# Patient Record
Sex: Female | Born: 1960 | Race: Black or African American | Hispanic: No | Marital: Single | Smoking: Former smoker
Health system: Southern US, Community
[De-identification: ages and names within clinical notes are randomized; demographics above are authoritative.]

## PROBLEM LIST (undated history)

## (undated) DIAGNOSIS — H469 Unspecified optic neuritis: Secondary | ICD-10-CM

## (undated) DIAGNOSIS — K439 Ventral hernia without obstruction or gangrene: Secondary | ICD-10-CM

## (undated) DIAGNOSIS — I442 Atrioventricular block, complete: Secondary | ICD-10-CM

## (undated) DIAGNOSIS — Z9289 Personal history of other medical treatment: Secondary | ICD-10-CM

## (undated) DIAGNOSIS — I340 Nonrheumatic mitral (valve) insufficiency: Secondary | ICD-10-CM

## (undated) DIAGNOSIS — I639 Cerebral infarction, unspecified: Secondary | ICD-10-CM

## (undated) DIAGNOSIS — I10 Essential (primary) hypertension: Secondary | ICD-10-CM

## (undated) DIAGNOSIS — E46 Unspecified protein-calorie malnutrition: Secondary | ICD-10-CM

## (undated) DIAGNOSIS — I5032 Chronic diastolic (congestive) heart failure: Secondary | ICD-10-CM

## (undated) DIAGNOSIS — R000 Tachycardia, unspecified: Secondary | ICD-10-CM

## (undated) DIAGNOSIS — H269 Unspecified cataract: Secondary | ICD-10-CM

## (undated) DIAGNOSIS — E538 Deficiency of other specified B group vitamins: Secondary | ICD-10-CM

## (undated) DIAGNOSIS — K219 Gastro-esophageal reflux disease without esophagitis: Secondary | ICD-10-CM

## (undated) DIAGNOSIS — I6529 Occlusion and stenosis of unspecified carotid artery: Secondary | ICD-10-CM

## (undated) DIAGNOSIS — I071 Rheumatic tricuspid insufficiency: Secondary | ICD-10-CM

## (undated) DIAGNOSIS — D649 Anemia, unspecified: Secondary | ICD-10-CM

## (undated) DIAGNOSIS — I052 Rheumatic mitral stenosis with insufficiency: Secondary | ICD-10-CM

## (undated) DIAGNOSIS — I5031 Acute diastolic (congestive) heart failure: Secondary | ICD-10-CM

## (undated) DIAGNOSIS — H40009 Preglaucoma, unspecified, unspecified eye: Secondary | ICD-10-CM

## (undated) DIAGNOSIS — F1020 Alcohol dependence, uncomplicated: Secondary | ICD-10-CM

## (undated) DIAGNOSIS — I471 Supraventricular tachycardia: Secondary | ICD-10-CM

## (undated) DIAGNOSIS — R945 Abnormal results of liver function studies: Secondary | ICD-10-CM

## (undated) DIAGNOSIS — G459 Transient cerebral ischemic attack, unspecified: Secondary | ICD-10-CM

## (undated) DIAGNOSIS — J45909 Unspecified asthma, uncomplicated: Secondary | ICD-10-CM

## (undated) DIAGNOSIS — D539 Nutritional anemia, unspecified: Secondary | ICD-10-CM

## (undated) DIAGNOSIS — R011 Cardiac murmur, unspecified: Secondary | ICD-10-CM

## (undated) DIAGNOSIS — Z9889 Other specified postprocedural states: Secondary | ICD-10-CM

## (undated) DIAGNOSIS — Z954 Presence of other heart-valve replacement: Secondary | ICD-10-CM

## (undated) HISTORY — DX: Rheumatic mitral stenosis with insufficiency: I05.2

## (undated) HISTORY — DX: Personal history of other medical treatment: Z92.89

## (undated) HISTORY — DX: Occlusion and stenosis of unspecified carotid artery: I65.29

## (undated) HISTORY — DX: Alcohol dependence, uncomplicated: F10.20

## (undated) HISTORY — DX: Ventral hernia without obstruction or gangrene: K43.9

## (undated) HISTORY — DX: Acute diastolic (congestive) heart failure: I50.31

## (undated) HISTORY — DX: Chronic diastolic (congestive) heart failure: I50.32

## (undated) HISTORY — DX: Abnormal results of liver function studies: R94.5

## (undated) HISTORY — DX: Preglaucoma, unspecified, unspecified eye: H40.009

## (undated) HISTORY — DX: Unspecified protein-calorie malnutrition: E46

## (undated) HISTORY — DX: Unspecified cataract: H26.9

## (undated) HISTORY — DX: Unspecified optic neuritis: H46.9

## (undated) HISTORY — DX: Rheumatic tricuspid insufficiency: I07.1

## (undated) HISTORY — DX: Deficiency of other specified B group vitamins: E53.8

## (undated) HISTORY — PX: CARDIAC CATHETERIZATION: SHX172

## (undated) HISTORY — PX: CARDIAC VALVE REPLACEMENT: SHX585

## (undated) HISTORY — DX: Supraventricular tachycardia: I47.1

## (undated) HISTORY — PX: APPENDECTOMY: SHX54

## (undated) HISTORY — DX: Nonrheumatic mitral (valve) insufficiency: I34.0

## (undated) NOTE — *Deleted (*Deleted)
Neurology Consultation  CC: Asked to see patient by [facility] attending for drooling and weakness.   History is obtained from: patient and chart  HPI: Mary [name] is a 67 y.o. 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 She missed her last Warfarin check leading to an INR over 10 on admission. She presented to the ED with c/o numbness to her extremities which has moved proximally to her trunk and face. She was found to have AKI which has been corrected with IV hydration. Patient reports an about 6 mos hx of neuropathy which started just in her feet. Her PCP placed her on Neurontin 300mg  po qhs. About a month later, she returned to her PCP because the 300mg  dose was not helping anymore. Then, she was placed on 100mg  po tid with continued dose of 300mg  at bedtime. About 2 weeks ago, she began to notice more pain in her legs and hands and raised the dose of Neurontin to 300mg  po qid on her own. She did not report this to her PCP. Over the past week, she states her numbness has now traveled proximately to her face. Started at chin, then lips, then nose, then entire head. She also states her gait became wobbly and her vision was blurred. She fell at home without noted injury. She did hit her head. She normally walks with a cane and rides in an electric wheelchair when out in stores.  She states her eating has been a lot less because of the pain and numbness in her mouth. She reports grinding of her teeth at night. She states she has bitten both sides of her tongue trying to eat. She denies any recent dental work. She admits to eating mostly meat and few vegetables. She states she has always been a small portion eater. Pain seems to be the main reason for the decreased intake.  When asked about ETOH consumption, she admits to 3 glasses of wine a day or at least every other day. When prompted, she states that maybe she has  been drinking more at home since she lost her job. However, she states she has not had any ETOH in 2 weeks.  She admits to blurry vision at a distance, like watching TV. She also states over the past week, she has had to hold her phone very close to her face to see.  She denies hx of muscle diagnoses or autoimmune problems. Denies thyroid issues. She does not take any vitamins at home except Vit D and Folate. Her sister has sarcoidosis.    ROS: A complete ROS was performed and is negative except as noted in the HPI.   Past Medical History:  Diagnosis Date  . Acute diastolic heart failure (HCC) 03/15/2013  . Anemia   . B12 deficiency   . Carotid stenosis    Carotid US 2/17: bilateral ICA 1-39% >> FU prn  . Childhood asthma   . Complete heart block (HCC)   . Epigastric hernia 200's  . GERD (gastroesophageal reflux disease)   . Heart murmur   . History of blood transfusion    "14 w/1st pregnancy; 2 w/last C-section" (03/15/2013)  . Hx of echocardiogram    a. Echo 4/16:  Mild focal basal septal hypertrophy, EF 60-65%, no RWMA, Mechanical MVR ok with mild central regurgitation and no perivalvular leak, small mobile density attached to valvular ring (1.5x1 cm) - post surgical changes  vs SBE, mild LAE;   b. Echo 2/17:  Mild post wall LVH, EF 55-60%, no RWMA, mechanical MVR ok (mean 5 mmHg), normal RVSF  . Hypertension    no pcp   will go to [facility]  . Macrocytic anemia   . 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   33mm Sorin Carbomedics mechanical prosthesis via right mini thoracotomy approach  . 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 Severe mitral regurgitation 04/22/2013  . Sinus tachycardia   . SVT (supraventricular tachycardia) (HCC)  03/16/2013  . TIA (transient ischemic attack)   . Tricuspid regurgitation      Family History  Problem Relation Age of Onset  . Stroke Father   . Cancer Father   . Sarcoidosis Sister   . Heart attack Neg Hx   . Colon cancer Neg Hx   . Stomach cancer Neg Hx   . Pancreatic cancer Neg Hx   . Esophageal cancer Neg Hx     Social History:  reports that she quit smoking about 7 years ago. Her smoking use included cigarettes. She has a 18.00 pack-year smoking history. She has never used smokeless tobacco. She reports current alcohol use. She reports that she does not use drugs.   Prior to Admission medications   Medication Sig Start Date End Date Taking? Authorizing Provider  acetaminophen (TYLENOL) 500 MG tablet Take 1,000 mg by mouth every 6 (six) hours as needed for headache.   Yes [provider]  aspirin EC 81 MG tablet Take 1 tablet (81 mg total) by mouth daily. 08/26/13  Yes Laurey Morale, MD  folic acid (FOLVITE) 1 MG tablet Take 1 mg by mouth daily.   Yes [provider]  gabapentin (NEURONTIN) 100 MG capsule Take 1 capsule (100 mg total) by mouth 2 (two) times daily. And continue 300 mg at bedtime 06/26/20  Yes Fulp, Cammie, MD  gabapentin (NEURONTIN) 300 MG capsule Take 1 capsule (300 mg total) by mouth at bedtime. As needed for nerve pain Patient taking differently: Take 300 mg by mouth in the morning, at noon, and at bedtime.  06/05/20  Yes Fulp, Cammie, MD  Hyprom-Naphaz-Polysorb-Zn Sulf (CLEAR EYES COMPLETE OP) Place 2 drops into both eyes daily as needed (red eyes).   Yes [provider]  pantoprazole (PROTONIX) 40 MG tablet Take 1 tablet (40 mg total) by mouth daily. 05/05/20 08/14/21 Yes [place], Alvira Philips, DO  Vitamin D, Ergocalciferol, (DRISDOL) 1.25 MG (50000 UNIT) CAPS capsule Take 1 capsule (50,000 Units total) by mouth every 7 (seven) days. Patient taking differently: Take 50,000 Units by mouth every Tuesday.  06/07/20  Yes Claiborne Rigg, NP   warfarin (COUMADIN) 5 MG tablet Take 1 - 1/2 tablet daily or as directed by Coumadin clinic. Patient taking differently: Take 2.5-5 mg by mouth See admin instructions. Take 1 - 1/2 tablet daily or as directed by Coumadin clinic.Wed and Sun 2.5 mg, 5 mg all other days 07/28/20  Yes Marinus Maw, MD  furosemide (LASIX) 40 MG tablet Take 1 tablet (40 mg total) by mouth daily. Patient not taking: Reported on 08/18/2020 05/06/20   Jake Bathe, MD  potassium chloride SA (KLOR-CON) 20 MEQ tablet Take 1 tablet (20 mEq total) by mouth daily. Patient not taking: Reported on 08/18/2020 05/06/20  Jake Bathe, MD     Exam: Current vital signs: BP (!) 112/96 (BP Location: Right Arm)   Pulse 100   Temp 97.8 F (36.6 C) (Oral)   Resp 17   Ht 5\' 2"  (1.575 m)   LMP 03/22/2013   SpO2 100%   BMI 20.49 kg/m    Physical Exam  Constitutional: Appears well-developed and well-nourished.  Psych: Affect appropriate to situation Eyes: No scleral injection HENT: No OP obstrucion Head: Normocephalic.  Cardiovascular: Normal rate and regular rhythm.  Respiratory: Effort normal, non-labored breathing GI: Soft.  No distension. There is no tenderness.  Skin: WDI  Neuro: Mental Status: Patient is awake, alert, oriented to person, place, month, year, and situation Patient is able to give a clear and coherent history. No signs of aphasia or neglect Cranial Nerves: II: Visual Fields are decreased. Pupils are equal, round, and reactive to light.  III,IV, VI: EOMI without ptosis or diplopia.  V: Facial sensation is symmetric to stroking, but less on left to temperature.  VII: Facial movement is symmetric.  VIII: hearing is intact to voice. Weber/Rinne normal.  X: Uvula elevates symmetrically XI: Shoulder shrug is symmetric. XII: tongue is midline without atrophy or fasciculations.  Motor: Bulk is normal. Tone is difficult to assess to LEs due to pain. She is able to lift her left leg slightly off bed  and hold it for about 4 seconds. She is only able to life her right leg about 2 inches off bed and can not hold it. She is able to kick out her right leg when knee is held.  Sensory: sharp and dull is intact but less on the left face, arm and leg. Generally, decreased overall to feet, but still right over left.  Deep Tendon Reflexes: 2+ and symmetric in the biceps and radial. 1+ patellar and ankles.  Plantars: Toes are downgoing bilaterally.  Cerebellar: Unable to perform.  Gait: Deferred.    I have reviewed labs in epic and the pertinent results are:  Lab Results  Component Value Date/Time   CHOL 189 05/31/2018 11:08 AM    Results for orders placed or performed during the hospital encounter of 08/17/20 (from the past 48 hour(s))  CBC with Differential     Status: Abnormal   Collection Time: 08/17/20  4:05 PM  Result Value Ref Range   WBC 5.4 4.0 - 10.5 K/uL   RBC 2.13 (L) 3.87 - 5.11 MIL/uL   Hemoglobin 9.4 (L) 12.0 - 15.0 g/dL   HCT 40.9 (L) 36 - 46 %   MCV 126.8 (H) 80.0 - 100.0 fL   MCH 44.1 (H) 26.0 - 34.0 pg   MCHC 34.8 30.0 - 36.0 g/dL   RDW 81.1 (H) 91.4 - 78.2 %   Platelets 368 150 - 400 K/uL   nRBC 0.4 (H) 0.0 - 0.2 %   Neutrophils Relative % 69 %   Neutro Abs 3.7 1.7 - 7.7 K/uL   Lymphocytes Relative 25 %   Lymphs Abs 1.3 0.7 - 4.0 K/uL   Monocytes Relative 5 %   Monocytes Absolute 0.3 0.1 - 1.0 K/uL   Eosinophils Relative 1 %   Eosinophils Absolute 0.1 0.0 - 0.5 K/uL   Basophils Relative 0 %   Basophils Absolute 0.0 0.0 - 0.1 K/uL   Immature Granulocytes 0 %   Abs Immature Granulocytes 0.02 0.00 - 0.07 K/uL    Comment: Performed at [facility], [address], [place], [place] [address]  Comprehensive  metabolic panel     Status: Abnormal   Collection Time: 08/17/20  4:05 PM  Result Value Ref Range   Sodium 134 (L) 135 - 145 mmol/L   Potassium 5.0 3.5 - 5.1 mmol/L    Comment: HEMOLYSIS AT THIS LEVEL MAY AFFECT RESULT   Chloride 104 98 - 111 mmol/L    CO2 16 (L) 22 - 32 mmol/L   Glucose, Bld 89 70 - 99 mg/dL    Comment: Glucose reference range applies only to samples taken after fasting for at least 8 hours.   BUN 16 6 - 20 mg/dL   Creatinine, Ser 1.61 (H) 0.44 - 1.00 mg/dL   Calcium 8.7 (L) 8.9 - 10.3 mg/dL   Total Protein 5.8 (L) 6.5 - 8.1 g/dL   Albumin 3.0 (L) 3.5 - 5.0 g/dL   AST 92 (H) 15 - 41 U/L   ALT 47 (H) 0 - 44 U/L   Alkaline Phosphatase 114 38 - 126 U/L   Total Bilirubin 1.3 (H) 0.3 - 1.2 mg/dL   GFR, Estimated 45 (L) >60 mL/min    Comment: (NOTE) Calculated using the CKD-EPI Creatinine Equation (2021)    Anion gap 14 5 - 15    Comment: Performed at [facility], [address] [place], [place] [address]  I-Stat arterial blood gas, ED     Status: Abnormal   Collection Time: 08/18/20 12:04 AM  Result Value Ref Range   pH, Arterial 7.489 (H) 7.35 - 7.45   pCO2 arterial 22.4 (L) 32 - 48 mmHg   pO2, Arterial 106 83 - 108 mmHg   Bicarbonate 17.1 (L) 20.0 - 28.0 mmol/L   TCO2 18 (L) 22 - 32 mmol/L   O2 Saturation 99.0 %   Acid-base deficit 5.0 (H) 0.0 - 2.0 mmol/L   Sodium 135 135 - 145 mmol/L   Potassium 3.9 3.5 - 5.1 mmol/L   Calcium, Ion 1.18 1.15 - 1.40 mmol/L   HCT 27.0 (L) 36 - 46 %   Hemoglobin 9.2 (L) 12.0 - 15.0 g/dL   Patient temperature 54.0 F    Collection site Radial    Drawn by RT    Sample type ARTERIAL   Protime-INR     Status: Abnormal   Collection Time: 08/18/20  1:31 AM  Result Value Ref Range   Prothrombin Time >90.0 (H) 11.4 - 15.2 seconds   INR >10.0 (HH) 0.8 - 1.2    Comment: REPEATED TO VERIFY CRITICAL RESULT CALLED TO, READ BACK BY AND VERIFIED WITH: Y. PATTERSON,RN [id number] 08/18/2020 T. TYSOR (NOTE) INR goal varies based on device and disease states. Performed at [facility], [address] [place], [place] [address]   Ammonia     Status: Abnormal   Collection Time: 08/18/20  1:31 AM  Result Value Ref Range   Ammonia 49 (H) 9 - 35 umol/L    Comment: Performed at  [facility], [address] [place], [place] [address]  Basic metabolic panel     Status: Abnormal   Collection Time: 08/18/20  8:10 AM  Result Value Ref Range   Sodium 137 135 - 145 mmol/L   Potassium 3.5 3.5 - 5.1 mmol/L   Chloride 105 98 - 111 mmol/L   CO2 21 (L) 22 - 32 mmol/L   Glucose, Bld 93 70 - 99 mg/dL    Comment: Glucose reference range applies only to samples taken after fasting for at least 8 hours.   BUN 13  6 - 20 mg/dL   Creatinine, Ser 2.72 (H) 0.44 - 1.00 mg/dL   Calcium 8.6 (L) 8.9 - 10.3 mg/dL   GFR, Estimated 52 (L) >60 mL/min    Comment: (NOTE) Calculated using the CKD-EPI Creatinine Equation (2021)    Anion gap 11 5 - 15    Comment: Performed at [facility], [address], [place], [place] [address]  Respiratory Panel by RT PCR (Flu A&B, Covid) - Nasopharyngeal Swab     Status: None   Collection Time: 08/18/20  9:52 AM   Specimen: Nasopharyngeal Swab; Nasopharyngeal(NP) swabs in vial transport medium  Result Value Ref Range   SARS Coronavirus 2 by RT PCR NEGATIVE NEGATIVE    Comment: (NOTE) SARS-CoV-2 target nucleic acids are NOT DETECTED.  The SARS-CoV-2 RNA is generally detectable in upper respiratoy specimens during the acute phase of infection. The lowest concentration of SARS-CoV-2 viral copies this assay can detect is 131 copies/mL. A negative result does not preclude SARS-Cov-2 infection and should not be used as the sole basis for treatment or other patient management decisions. A negative result may occur with  improper specimen collection/handling, submission of specimen other than nasopharyngeal swab, presence of viral mutation(s) within the areas targeted by this assay, and inadequate number of viral copies (<131 copies/mL). A negative result must be combined with clinical observations, patient history, and epidemiological information. The expected result is Negative.  Fact Sheet for Patients:   https://www.moore.com/  Fact Sheet for Healthcare Providers:  https://www.young.biz/  This test is no t yet approved or cleared by the [place] FDA and  has been authorized for detection and/or diagnosis of SARS-CoV-2 by FDA under an Emergency Use Authorization (EUA). This EUA will remain  in effect (meaning this test can be used) for the duration of the COVID-19 declaration under Section 564(b)(1) of the Act, 21 U.S.C. section 360bbb-3(b)(1), unless the authorization is terminated or revoked sooner.     Influenza A by PCR NEGATIVE NEGATIVE   Influenza B by PCR NEGATIVE NEGATIVE    Comment: (NOTE) The Xpert Xpress SARS-CoV-2/FLU/RSV assay is intended as an aid in  the diagnosis of influenza from Nasopharyngeal swab specimens and  should not be used as a sole basis for treatment. Nasal washings and  aspirates are unacceptable for Xpert Xpress SARS-CoV-2/FLU/RSV  testing.  Fact Sheet for Patients: https://www.moore.com/  Fact Sheet for Healthcare Providers: https://www.young.biz/  This test is not yet approved or cleared by the [place] FDA and  has been authorized for detection and/or diagnosis of SARS-CoV-2 by  FDA under an Emergency Use Authorization (EUA). This EUA will remain  in effect (meaning this test can be used) for the duration of the  Covid-19 declaration under Section 564(b)(1) of the Act, 21  U.S.C. section 360bbb-3(b)(1), unless the authorization is  terminated or revoked. Performed at [facility], [address], [place], [place] [address]     MR BRAIN WO CONTRAST  Result Date: 08/18/2020 CLINICAL DATA:  Dizziness EXAM: MRI HEAD WITHOUT CONTRAST TECHNIQUE: Multiplanar, multiecho pulse sequences of the brain and surrounding structures were obtained without intravenous contrast. COMPARISON:  None. FINDINGS: Brain: No acute infarct, acute hemorrhage or extra-axial  collection. Normal white matter signal. Normal volume of CSF spaces. Fewer than 5 scattered microhemorrhages in a nonspecific pattern. Normal midline structures. Vascular: Major flow voids are preserved. Skull and upper cervical spine: Normal calvarium and skull base. Visualized upper cervical spine and soft tissues are normal. Sinuses/Orbits:No paranasal sinus fluid  levels or advanced mucosal thickening. No mastoid or middle ear effusion. Normal orbits. IMPRESSION: 1. No acute intracranial abnormality. 2. Fewer than 5 scattered microhemorrhages in a nonspecific pattern. Electronically Signed   By: Deatra Robinson M.D.   On: 08/18/2020 01:22     I have reviewed the images obtained  Primary Diagnosis:  1. Extremity weakness with motor and sensory deficits.  2. ETOH use 3. Poor nutrition (lack of vegetables).   Impression: 30 yo female who presents with drooling, weakness, numbness to the ED. On hx, discovered her appetite is poor and she lacks good nutrition. There is also an ETOH component which may play a role in her weakness and numbness.   Recommendations: 1. Check various Vit levels. 2. Check heavy metal screen.  3. Check ETOH level.  4. Auto immune workup.   Jimmye Norman, NP-pt seen independently and also by Dr. Kirtland Bouchard. Further recommendation per him.     Electronically signed

---

## 1997-09-26 HISTORY — PX: TUBAL LIGATION: SHX77

## 1997-11-11 ENCOUNTER — Ambulatory Visit (HOSPITAL_COMMUNITY): Admission: RE | Admit: 1997-11-11 | Discharge: 1997-11-11 | Payer: Self-pay | Admitting: Obstetrics

## 1997-12-01 ENCOUNTER — Ambulatory Visit (HOSPITAL_COMMUNITY): Admission: RE | Admit: 1997-12-01 | Discharge: 1997-12-01 | Payer: Self-pay | Admitting: Obstetrics

## 1998-01-14 ENCOUNTER — Ambulatory Visit (HOSPITAL_COMMUNITY): Admission: RE | Admit: 1998-01-14 | Discharge: 1998-01-14 | Payer: Self-pay | Admitting: Obstetrics

## 1998-02-23 ENCOUNTER — Ambulatory Visit (HOSPITAL_COMMUNITY): Admission: RE | Admit: 1998-02-23 | Discharge: 1998-02-23 | Payer: Self-pay | Admitting: Obstetrics

## 1998-02-25 ENCOUNTER — Other Ambulatory Visit: Admission: RE | Admit: 1998-02-25 | Discharge: 1998-02-25 | Payer: Self-pay | Admitting: Obstetrics

## 1998-03-09 ENCOUNTER — Ambulatory Visit (HOSPITAL_COMMUNITY): Admission: RE | Admit: 1998-03-09 | Discharge: 1998-03-09 | Payer: Self-pay | Admitting: Obstetrics

## 1998-03-19 ENCOUNTER — Inpatient Hospital Stay (HOSPITAL_COMMUNITY): Admission: AD | Admit: 1998-03-19 | Discharge: 1998-03-22 | Payer: Self-pay | Admitting: Obstetrics

## 1998-05-21 ENCOUNTER — Inpatient Hospital Stay (HOSPITAL_COMMUNITY): Admission: EM | Admit: 1998-05-21 | Discharge: 1998-05-24 | Payer: Self-pay

## 2000-08-05 ENCOUNTER — Emergency Department (HOSPITAL_COMMUNITY): Admission: EM | Admit: 2000-08-05 | Discharge: 2000-08-05 | Payer: Self-pay | Admitting: *Deleted

## 2000-08-05 ENCOUNTER — Encounter: Payer: Self-pay | Admitting: *Deleted

## 2001-01-24 ENCOUNTER — Other Ambulatory Visit: Admission: RE | Admit: 2001-01-24 | Discharge: 2001-01-24 | Payer: Self-pay | Admitting: Obstetrics

## 2001-02-07 ENCOUNTER — Encounter: Payer: Self-pay | Admitting: Obstetrics

## 2001-02-07 ENCOUNTER — Ambulatory Visit (HOSPITAL_COMMUNITY): Admission: RE | Admit: 2001-02-07 | Discharge: 2001-02-07 | Payer: Self-pay | Admitting: Obstetrics

## 2001-09-26 HISTORY — PX: LAPAROSCOPIC CHOLECYSTECTOMY: SUR755

## 2002-04-10 ENCOUNTER — Encounter: Payer: Self-pay | Admitting: Emergency Medicine

## 2002-04-10 ENCOUNTER — Emergency Department (HOSPITAL_COMMUNITY): Admission: EM | Admit: 2002-04-10 | Discharge: 2002-04-10 | Payer: Self-pay | Admitting: Emergency Medicine

## 2002-04-25 ENCOUNTER — Encounter: Payer: Self-pay | Admitting: Surgery

## 2002-04-25 ENCOUNTER — Encounter (INDEPENDENT_AMBULATORY_CARE_PROVIDER_SITE_OTHER): Payer: Self-pay | Admitting: Specialist

## 2002-04-25 ENCOUNTER — Observation Stay (HOSPITAL_COMMUNITY): Admission: RE | Admit: 2002-04-25 | Discharge: 2002-04-26 | Payer: Self-pay | Admitting: Surgery

## 2002-12-12 ENCOUNTER — Emergency Department (HOSPITAL_COMMUNITY): Admission: EM | Admit: 2002-12-12 | Discharge: 2002-12-12 | Payer: Self-pay | Admitting: Emergency Medicine

## 2004-07-27 ENCOUNTER — Ambulatory Visit: Payer: Self-pay | Admitting: Sports Medicine

## 2004-07-27 ENCOUNTER — Encounter (INDEPENDENT_AMBULATORY_CARE_PROVIDER_SITE_OTHER): Payer: Self-pay | Admitting: *Deleted

## 2004-07-27 ENCOUNTER — Other Ambulatory Visit: Admission: RE | Admit: 2004-07-27 | Discharge: 2004-07-27 | Payer: Self-pay | Admitting: Family Medicine

## 2004-08-24 ENCOUNTER — Ambulatory Visit (HOSPITAL_COMMUNITY): Admission: RE | Admit: 2004-08-24 | Discharge: 2004-08-24 | Payer: Self-pay

## 2005-07-04 ENCOUNTER — Emergency Department (HOSPITAL_COMMUNITY): Admission: EM | Admit: 2005-07-04 | Discharge: 2005-07-04 | Payer: Self-pay | Admitting: Emergency Medicine

## 2005-07-13 ENCOUNTER — Ambulatory Visit: Payer: Self-pay | Admitting: Family Medicine

## 2005-07-28 ENCOUNTER — Ambulatory Visit: Payer: Self-pay | Admitting: Family Medicine

## 2005-07-28 ENCOUNTER — Inpatient Hospital Stay (HOSPITAL_COMMUNITY): Admission: EM | Admit: 2005-07-28 | Discharge: 2005-07-29 | Payer: Self-pay | Admitting: Emergency Medicine

## 2006-09-23 ENCOUNTER — Emergency Department (HOSPITAL_COMMUNITY): Admission: EM | Admit: 2006-09-23 | Discharge: 2006-09-23 | Payer: Self-pay | Admitting: Emergency Medicine

## 2006-11-24 ENCOUNTER — Encounter (INDEPENDENT_AMBULATORY_CARE_PROVIDER_SITE_OTHER): Payer: Self-pay | Admitting: *Deleted

## 2008-06-30 ENCOUNTER — Emergency Department (HOSPITAL_COMMUNITY): Admission: EM | Admit: 2008-06-30 | Discharge: 2008-06-30 | Payer: Self-pay | Admitting: Emergency Medicine

## 2009-09-15 ENCOUNTER — Emergency Department (HOSPITAL_COMMUNITY): Admission: EM | Admit: 2009-09-15 | Discharge: 2009-09-15 | Payer: Self-pay | Admitting: Emergency Medicine

## 2010-08-23 ENCOUNTER — Emergency Department (HOSPITAL_COMMUNITY)
Admission: EM | Admit: 2010-08-23 | Discharge: 2010-08-24 | Payer: Self-pay | Source: Home / Self Care | Admitting: Emergency Medicine

## 2010-11-12 ENCOUNTER — Encounter: Payer: Self-pay | Admitting: Family Medicine

## 2010-12-08 LAB — URINALYSIS, ROUTINE W REFLEX MICROSCOPIC
Nitrite: NEGATIVE
Specific Gravity, Urine: 1.022 (ref 1.005–1.030)
Urobilinogen, UA: 0.2 mg/dL (ref 0.0–1.0)
pH: 5.5 (ref 5.0–8.0)

## 2010-12-08 LAB — COMPREHENSIVE METABOLIC PANEL
ALT: 17 U/L (ref 0–35)
Alkaline Phosphatase: 90 U/L (ref 39–117)
CO2: 23 mEq/L (ref 19–32)
Calcium: 8.5 mg/dL (ref 8.4–10.5)
Chloride: 110 mEq/L (ref 96–112)
GFR calc non Af Amer: >60 mL/min (ref >60)
Glucose, Bld: 81 mg/dL (ref 70–99)
Potassium: 3.3 mEq/L — ABNORMAL LOW (ref 3.5–5.1)
Sodium: 140 mEq/L (ref 135–145)
Total Bilirubin: 0.8 mg/dL (ref 0.3–1.2)

## 2010-12-08 LAB — URINE MICROSCOPIC-ADD ON

## 2010-12-08 LAB — DIFFERENTIAL
Basophils Relative: 0 % (ref 0–1)
Eosinophils Absolute: 0.1 10*3/uL (ref 0.0–0.7)
Neutrophils Relative %: 67 % (ref 43–77)

## 2010-12-08 LAB — HEMOCCULT GUIAC POC 1CARD (OFFICE): Fecal Occult Bld: POSITIVE

## 2010-12-08 LAB — POCT CARDIAC MARKERS
CKMB, poc: <1 ng/mL — ABNORMAL LOW (ref 1.0–8.0)
Myoglobin, poc: 31.4 ng/mL (ref 12–200)

## 2010-12-08 LAB — CBC
HCT: 40.5 % (ref 36.0–46.0)
Hemoglobin: 13.9 g/dL (ref 12.0–15.0)
MCHC: 34.3 g/dL (ref 30.0–36.0)

## 2010-12-08 LAB — PROTIME-INR: INR: 1.06 (ref 0.00–1.49)

## 2011-02-11 NOTE — H&P (Signed)
NAME:  Mary [name], Mary [name] NO.:  [id number]   MEDICAL RECORD NO.:  [id number]          PATIENT TYPE:  INP   LOCATION:  5714                         FACILITY:  [facility]   PHYSICIAN:  Asencion Partridge, M.D.     DATE OF BIRTH:  17-Jul-1961   DATE OF ADMISSION:  07/28/2005  DATE OF DISCHARGE:                                HISTORY & PHYSICAL   PRIMARY CARE PHYSICIAN:  Dr. Sharin Grave, [facility].   CHIEF COMPLAINT:  Abdominal pain/nausea and vomiting.   HISTORY OF PRESENT ILLNESS:  The patient is a 50 year old African-American  female that was in her usual state of good health until about 3 weeks ago  when she began having non bilious, non bloody emesis that has since  decreased in frequency. She now has nausea even after she has vomited and it  has become an apparently chronic condition. She has had a significant  decrease in tolerance to p.o. solids. She has noted a dry mouth recently and  decreased urine output.  She has not had any sick contacts. She has not had any previous symptoms.  She has noted no other people with similar symptoms. She has also had  frequent loose stools recently. She has not noticed any blood in her stools  and has not noticed any melanotic changes. Of note, the patient states that  her vomitus had a mucus-like film that was slightly yellow tinged and was  similar to the film that was present in her loose stools.   REVIEW OF SYSTEMS:  CONSTITUTIONAL: She has had chills and occasional  dizziness. No fevers. CARDIOVASCULAR:  No chest pains. PULMONARY:  No  shortness of breath. GI: She has had diffuse abdominal pain. GENITOURINARY:  Decrease in the frequency of her urination. She has also noted that her  urine was darker than usual. However she has had no burning with urination.  SKIN: No rash. GYNECOLOGIC: She has had no change in periods and has regular  menses. PSYCHIATRIC: She has had some endorsement of recent  depressive  symptoms. She says, I feel depressed. She has no suicidal, no homicidal  ideations. She has changes in her sleep. She has also had decreased  activity. She normally is very active in helping care for her teenage son  but has been less engaged in her life recently. She has never had any  previous symptoms like this and she endorses no history of manic symptoms.   PAST MEDICAL HISTORY:  1.  Appendectomy.  2.  Cholecystectomy.  3.  Two cesarean sections.  4.  Bilateral tubal ligation.   ALLERGIES:  No known drug allergies.   MEDICATIONS:  Ibuprofen p.r.n.   SOCIAL HISTORY:  She lives with her husband in [place]. She has two sons  and one daughter. She smokes about 1/3 pack a day. She uses marijuana  occasionally, no other drugs. She works as a Counsellor. She uses alcohol  rarely.   FAMILY HISTORY:  Notable for a sister with sarcoidosis. Her mother and  father are alive and healthy.   PHYSICAL EXAMINATION:  VITAL SIGNS:  Temperature 96.7, pulse 72, respiratory  rate 20, blood pressure 102/68, 100% on room air.  GENERAL: She is in no apparent distress.  MENTAL STATUS:  She is alert and oriented.  HEENT:  Normocephalic. Pupils are equal, round and reactive to light. Ears -  tympanic membranes within normal limits bilaterally. Mouth/throat - mucous  membranes are mildly dry, no oropharyngeal erythema.  CHEST EXAM:  Precordium is within normal limits. Lungs are clear to  auscultation bilaterally.  HEART:  Normal S1, S2, she has no murmurs, rubs, or gallops appreciated.  ABDOMEN: Mildly tender to palpation along the left lower quadrant. No  rebound. Positive bowel sounds. Her bellie is soft, it is not consistent  with an acute abdomen.  She has no suprapubic pain that is noted in the area of the bladder.  EXTREMITIES:  She has a healing lesion on the left shin. No edema. Pulses  are 2+ and symmetric bilaterally in the radius and dorsalis pedis  distribution.  GENITOR  ECTAL EXAM:  Deferred.  NEUROLOGIC:  Cerebellar function is within normal limits. Cranial nerves II-  XII grossly intact are intact bilaterally. Strength and deep tendon reflexes  are within normal limits for bilateral upper and lower extremities. Balance  was not tested.  SKIN:  She has no rash.  LYMPH NODES:  She has no lymphadenopathy.  BACK:  No costovertebral angle tenderness.   LABORATORY DATA:  Urine pregnancy test is negative. Urinalysis is notable  for positive nitrites, small leukocyte esterase and small blood, urine was  cloudy with specific gravity of 1.015.  Urine microscopic showed 36 wbc's, 0-2 rbc's, and many bacteria. CBC is  notable for a white count of 6300 and hemoglobin 14.1, sed rate is 7.  Electrolytes within normal limits. Liver function tests within normal  limits. Lipase was 12.   CT of the abdomen obtained in the emergency department shows no acute  issues. There is a prominent ovarian vein which raises the possibility of  pelvic congestion syndrome.  There is also a small epigastric ventral hernia with only fat. She has  bilateral ovarian follicles. She has no free fluid noted in the abdomen. She  does have a right inguinal cystic lesion consistent with a lymphocele versus  a seroma.   ASSESSMENT/PLAN:  The patient is a 50 year old female with the following  problems:  1.  Dehydration likely secondary to problem number two. Will admit for bolus      IV fluids and treat with either Zofran or Phenergan. Her electrolytes      are okay, we will follow these.  2.  Vomiting/nausea/loose stools. There is no clear etiology for this. It is      possible that the findings on CT could be related to this. Her      urinalysis was noted to have the abnormalities above but she is having      no symptoms and this is not consistent with pyelonephritis. Viral      gastroenteritis is also unlikely given problem number 6 below. Would     consider irritable bowel syndrome as  a diagnosis. She has no previous no      predisposing factors for gastroparesis and she has no symptoms of GERD.      Metabolic issues would be in the differential such as celiac disease.      However, at this point dehydration and response to Zofran in the ED, I      think is reasonable to  admit her, rehydrate her, treat symptomatically      and follow her clinically. If her symptoms persist she can be worked up      further for the findings on the CT.  3.  Positive leukocyte esterase and nitrate on urinalysis. I will culture      her urine. She is having no symptoms and I will not treat her with      antibiotics now.  4.  Fluids/electrolytes/nutrition: Start on clear fluids and advance diet as      tolerated. She will be bolused as listed above.  5.  Deep venous thrombosis prophylaxis. Have her out of bed x3 daily.  6.  Possible depression/mood changes. This could be adjustment reaction      versus an actual mood disorder. She has no suicidal or homicidal      ideations. She is stable. I will follow this pending number one.      Dwana Curd Para March, M.D.    ______________________________  Asencion Partridge, M.D.    GSD/MEDQ  D:  07/28/2005  T:  07/29/2005  Job:  [id number]

## 2011-02-11 NOTE — Discharge Summary (Signed)
NAME:  Mary [name], Mary [name]               ACCOUNT NO.:  [id number]   MEDICAL RECORD NO.:  [id number]          PATIENT TYPE:  INP   LOCATION:  5714                         FACILITY:  [facility]   PHYSICIAN:  Rolm Gala, M.D.    DATE OF BIRTH:  1961/02/17   DATE OF ADMISSION:  07/28/2005  DATE OF DISCHARGE:  07/29/2005                                 DISCHARGE SUMMARY   PRIMARY CARE PHYSICIAN:  Dr. Tressia Danas at [facility].   CONSULTING PHYSICIANS:  None.   FINAL DIAGNOSIS:  Viral gastroenteritis.   PRINCIPLE PROCEDURE:  None.   LABORATORY DATA:  UA on November 2 shows specific gravity 1.015, negative  glucose, bilirubin, ketones.  Negative protein.  Positive nitrites.  Small  leukocytes.  Microscopic urine showed many bacteria, 3-6 white blood cells,  a few squamous epithelials.  CBC showed WBC 6.3, H&H 14.1 and 41.8,  platelets 369 with 61% neutrophils.  Lipase on November 2 was 12.  LFTs were  within normal limits.  On November 2, ESR was 7.  Urine culture is pending.  BMET on November 3 showed potassium 3.2.   HOSPITAL COURSE:  This is a 50 year old African-American female who was in  her usual good state of health who began having emesis that started three  weeks ago but that has been decreasing in frequency.  The nausea has now  become an apparently chronic condition.  She decreased her tolerance to p.o.  solids and some liquids.  Recently, she has noticed dry mouth and decreased  urine output.   Problem 1. Dehydration:  Likely secondary to the nausea and vomiting.  We  gave her IV fluid bolus and then maintenance IV fluids.   Problem 2. Nausea and vomiting, loose stools:  We treated her with Zofran  and Phenergan.  There is no clear etiology for this.  A CT was negative for  any findings related to this.  Her urinalysis was noted to have the  abnormalities noted above, but she is having no symptoms and is not  consistent with pyelonephritis.  We will follow up on the  urine culture.  Viral gastroenteritis is most likely.  Would consider irritable bowel  syndrome as an outpatient diagnosis.  She was discharged on Phenergan for  this problem.   Problem 3. Positive leukocyte esterase and nitrite on urinalysis:  Following  up on urine culture, she currently has no symptoms.   DISCHARGE INSTRUCTIONS:  Take medications as previously directed.  Follow up  with primary care doctor next week.   DISCHARGE MEDICATIONS:  Phenergan 12.5 mg 1 q.6h. p.r.n. nausea.      Rolm Gala, M.D.     Bennetta Laos  D:  07/29/2005  T:  07/29/2005  Job:  [id number]   cc:   Sharin Grave, MD  Fax: [phone]

## 2011-02-11 NOTE — Op Note (Signed)
Mary [name], Mary [name]                        ACCOUNT NO.:  [id number]   MEDICAL RECORD NO.:  [id number]                   PATIENT TYPE:  OBV   LOCATION:  0347                                 FACILITY:  [facility]   PHYSICIAN:  Currie Paris, M.D.           DATE OF BIRTH:  July 27, 1961   DATE OF PROCEDURE:  04/25/2002  DATE OF DISCHARGE:  04/26/2002                                 OPERATIVE REPORT   CCS#:  [id number]   PREOPERATIVE DIAGNOSIS:  Chronic calculous cholecystitis.   POSTOPERATIVE DIAGNOSES:  1. Chronic calculous cholecystitis.  2. Epigastric hernia.   PROCEDURE:  Laparoscopic cholecystectomy.   SURGEON:  Dr. Jamey Ripa.   ASSISTANT:  Ma Hillock, RNFA   CLINICAL HISTORY:  This patient is a 50 year old with biliary type symptoms  and gallstones. She was scheduled for laparoscopic cholecystectomy.   DESCRIPTION OF PROCEDURE:  The patient was seen in the holding area and had  no further questions. She was taken to the operating room and after  satisfactory general endotracheal anesthesia had been obtained the abdomen  was prepped and draped. The 0.25% plain Marcaine was used for each incision.  The umbilical incision was made first and the fascia identified and opened  and the peritoneal cavity entered under direct vision. A pursestring was  placed and the Hasson introduced. The abdomen was insufflated to 15.   The patient was placed in reverse Trendelenburg and three additional  cannulas were placed under direct vision. The gallbladder had a few  adhesions on it but easily retracted up over the liver. I did note the  presence of an epigastric hernia since I put the umbilical camera in and  this was actually palpable with the abdomen insufflated but appeared just to  have a little preperitoneal fat in it. I avoided this with the epigastric  trocar.   The peritoneum over the cystic duct area was opened and I made a nice window  here and could see the cystic duct and  cleaned it off so I could see its  junction with the gallbladder and down towards the common duct. I could see  what appeared to be the right hepatic artery branching up parallel to the  cystic duct and then curving back into the liver and giving off the cystic  artery. I divided and cleared off the cystic artery and put one clip on that  and one clip on the cystic duct.   The cystic duct was opened and a Cook catheter used to do cholangiography  which looked normal with good filling of the hepatic ducts, common duct and  duodenum with no evidence of any filling defects and prompt emptying into  the duodenum. The catheter was removed and three clips placed on the stay  side of the cystic duct and it was divided. The cystic artery was cleaned  off a little bit more and two more clips placed and it was divided  leaving  two clips on the stay side. The small posterior branch was clipped and then  the gallbladder removed from below to above with coagulation current of the  cautery. Just prior to disconnecting I made sure everything was dry.   The gallbladder was brought out the umbilical port. The umbilical port was  occluded and the abdomen reinsufflated for final irrigation and checked for  hemostasis and again everything appeared to be dry. The lateral ports were  removed. The umbilical port was closed down with the pursestring and the  abdomen deflated through the epigastric port. The patient tolerated the  procedure well. There were no operative complications. All counts were  correct.                                                 Currie Paris, M.D.    CJS/MEDQ  D:  04/25/2002  T:  05/01/2002  Job:  [id number]   cc:   Kathreen Cosier, M.D.

## 2011-06-28 LAB — DIFFERENTIAL
Basophils Relative: 1
Monocytes Relative: 9
Neutro Abs: 2.5
Neutrophils Relative %: 51

## 2011-06-28 LAB — BASIC METABOLIC PANEL
Calcium: 9.2
Creatinine, Ser: 0.78
GFR calc Af Amer: >60
GFR calc non Af Amer: >60

## 2011-06-28 LAB — URINE MICROSCOPIC-ADD ON

## 2011-06-28 LAB — URINALYSIS, ROUTINE W REFLEX MICROSCOPIC
Glucose, UA: NEGATIVE
Specific Gravity, Urine: 1.026

## 2011-06-28 LAB — CBC
MCHC: 33.7
RBC: 4.11
WBC: 4.8

## 2011-06-28 LAB — POCT PREGNANCY, URINE: Preg Test, Ur: NEGATIVE

## 2011-07-27 ENCOUNTER — Emergency Department (HOSPITAL_COMMUNITY)
Admission: EM | Admit: 2011-07-27 | Discharge: 2011-07-27 | Disposition: A | Discharge status: Home / Self Care | Payer: Self-pay | Attending: Emergency Medicine | Admitting: Emergency Medicine

## 2011-07-27 DIAGNOSIS — R112 Nausea with vomiting, unspecified: Secondary | ICD-10-CM | POA: Insufficient documentation

## 2011-07-27 DIAGNOSIS — R42 Dizziness and giddiness: Secondary | ICD-10-CM | POA: Insufficient documentation

## 2011-07-27 DIAGNOSIS — F1010 Alcohol abuse, uncomplicated: Secondary | ICD-10-CM | POA: Insufficient documentation

## 2011-07-27 DIAGNOSIS — E162 Hypoglycemia, unspecified: Secondary | ICD-10-CM | POA: Insufficient documentation

## 2011-07-27 LAB — COMPREHENSIVE METABOLIC PANEL
ALT: 37 U/L — ABNORMAL HIGH (ref 0–35)
Alkaline Phosphatase: 86 U/L (ref 39–117)
CO2: 15 mEq/L — ABNORMAL LOW (ref 19–32)
Calcium: 9.1 mg/dL (ref 8.4–10.5)
GFR calc Af Amer: >90 mL/min (ref >90)
GFR calc non Af Amer: >90 mL/min (ref >90)
Glucose, Bld: 47 mg/dL — ABNORMAL LOW (ref 70–99)
Potassium: 2.8 mEq/L — ABNORMAL LOW (ref 3.5–5.1)
Sodium: 141 mEq/L (ref 135–145)
Total Bilirubin: 0.1 mg/dL — ABNORMAL LOW (ref 0.3–1.2)

## 2011-07-27 LAB — CBC
Hemoglobin: 13.7 g/dL (ref 12.0–15.0)
MCH: 33.7 pg (ref 26.0–34.0)
RBC: 4.06 MIL/uL (ref 3.87–5.11)

## 2011-07-27 LAB — GLUCOSE, CAPILLARY

## 2013-01-04 ENCOUNTER — Emergency Department (HOSPITAL_COMMUNITY)
Admission: EM | Admit: 2013-01-04 | Discharge: 2013-01-04 | Disposition: A | Discharge status: Home / Self Care | Payer: Medicaid Other | Attending: Emergency Medicine | Admitting: Emergency Medicine

## 2013-01-04 ENCOUNTER — Encounter (HOSPITAL_COMMUNITY): Payer: Self-pay | Admitting: Cardiology

## 2013-01-04 DIAGNOSIS — Z3202 Encounter for pregnancy test, result negative: Secondary | ICD-10-CM | POA: Insufficient documentation

## 2013-01-04 DIAGNOSIS — N390 Urinary tract infection, site not specified: Secondary | ICD-10-CM | POA: Insufficient documentation

## 2013-01-04 DIAGNOSIS — F17200 Nicotine dependence, unspecified, uncomplicated: Secondary | ICD-10-CM | POA: Insufficient documentation

## 2013-01-04 LAB — COMPREHENSIVE METABOLIC PANEL
AST: 28 U/L (ref 0–37)
Alkaline Phosphatase: 115 U/L (ref 39–117)
BUN: 13 mg/dL (ref 6–23)
CO2: 30 mEq/L (ref 19–32)
Chloride: 91 mEq/L — ABNORMAL LOW (ref 96–112)
Creatinine, Ser: 0.93 mg/dL (ref 0.50–1.10)
GFR calc non Af Amer: 70 mL/min — ABNORMAL LOW (ref >90)
Total Bilirubin: 1 mg/dL (ref 0.3–1.2)

## 2013-01-04 LAB — URINALYSIS, ROUTINE W REFLEX MICROSCOPIC
Ketones, ur: 15 mg/dL — AB
Protein, ur: 100 mg/dL — AB
Urobilinogen, UA: 1 mg/dL (ref 0.0–1.0)

## 2013-01-04 LAB — CBC WITH DIFFERENTIAL/PLATELET
HCT: 47.6 % — ABNORMAL HIGH (ref 36.0–46.0)
Hemoglobin: 17 g/dL — ABNORMAL HIGH (ref 12.0–15.0)
Lymphocytes Relative: 16 % (ref 12–46)
Monocytes Absolute: 0.6 10*3/uL (ref 0.1–1.0)
Monocytes Relative: 7 % (ref 3–12)
Neutro Abs: 7 10*3/uL (ref 1.7–7.7)
RBC: 5.06 MIL/uL (ref 3.87–5.11)
WBC: 9.3 10*3/uL (ref 4.0–10.5)

## 2013-01-04 LAB — LIPASE, BLOOD: Lipase: 17 U/L (ref 11–59)

## 2013-01-04 LAB — URINE MICROSCOPIC-ADD ON

## 2013-01-04 MED ORDER — SODIUM CHLORIDE 0.9 % IV BOLUS (SEPSIS)
500.0000 mL | Freq: Once | INTRAVENOUS | Status: AC
  Administered 2013-01-04: 500 mL via INTRAVENOUS

## 2013-01-04 MED ORDER — ONDANSETRON HCL 4 MG PO TABS: 4.0000 mg | ORAL_TABLET | Freq: Four times a day (QID) | ORAL | Status: DC

## 2013-01-04 MED ORDER — DEXTROSE 5 % IV SOLN
1.0000 g | Freq: Once | INTRAVENOUS | Status: AC
  Administered 2013-01-04: 1 g via INTRAVENOUS
  Filled 2013-01-04: qty 10

## 2013-01-04 MED ORDER — CEPHALEXIN 500 MG PO CAPS: 500.0000 mg | ORAL_CAPSULE | Freq: Four times a day (QID) | ORAL | Status: DC

## 2013-01-04 MED ORDER — SODIUM CHLORIDE 0.9 % IV BOLUS (SEPSIS)
1000.0000 mL | Freq: Once | INTRAVENOUS | Status: AC
  Administered 2013-01-04: 1000 mL via INTRAVENOUS

## 2013-01-04 MED ORDER — ONDANSETRON HCL 4 MG/2ML IJ SOLN
4.0000 mg | Freq: Once | INTRAMUSCULAR | Status: AC
  Administered 2013-01-04: 4 mg via INTRAVENOUS
  Filled 2013-01-04: qty 2

## 2013-01-04 NOTE — ED Provider Notes (Signed)
Medical screening examination/treatment/procedure(s) were performed by non-physician practitioner and as supervising physician I was immediately available for consultation/collaboration.   Charles B. Bernette Mayers, MD 01/04/13 1549

## 2013-01-04 NOTE — ED Provider Notes (Signed)
History     CSN: [id number]  Arrival date & time 01/04/13  1139   First MD Initiated Contact with Patient 01/04/13 1237      Chief Complaint  Patient presents with  . Nausea  . Emesis    (Consider location/radiation/quality/duration/timing/severity/associated sxs/prior treatment) HPI  52 year old female with prior surgical history including appendectomy, cesarean section, and cholecystectomy presents complaining of nausea and vomiting. Patient reports for the past 4 days she has had persistent nausea and vomiting. Has vomit a more than 10 times a day. Vomitus is non-bloody, non-bilious. She felt as if she is unable to keep anything down. She also noticed decreased urine output and a dark color to her urine. Otherwise patient denies fever, chills, chest pain, shortness of breath, back pain, burning urination, vaginal discharge, or rash. She denies any abdominal pain. No specific treatment tried.  History reviewed. No pertinent past medical history.  Past Surgical History  Procedure Laterality Date  . Appendectomy    . Cesarean section    . Cholecystectomy      History reviewed. No pertinent family history.  History  Substance Use Topics  . Smoking status: Current Every Day Smoker  . Smokeless tobacco: Not on file  . Alcohol Use: Yes    OB History   Grav Para Term Preterm Abortions TAB SAB Ect Mult Living                  Review of Systems  Constitutional:       10 Systems reviewed and all are negative for acute change except as noted in the HPI.     Allergies  Review of patient's allergies indicates no known allergies.  Home Medications  No current outpatient prescriptions on file.  BP 108/81  Pulse 104  Temp(Src) 98.8 F (37.1 C) (Oral)  Resp 16  SpO2 99%  LMP 12/05/2012  Physical Exam  Nursing note and vitals reviewed. Constitutional: She is oriented to person, place, and time. She appears well-developed and well-nourished. No distress.  Awake,  alert, nontoxic appearance  HENT:  Head: Atraumatic.  Mouth/Throat: Oropharynx is clear and moist.  Eyes: Conjunctivae are normal. Right eye exhibits no discharge. Left eye exhibits no discharge.  Neck: Neck supple.  Cardiovascular: Normal rate and regular rhythm.   Pulmonary/Chest: Effort normal. No respiratory distress. She exhibits no tenderness.  Abdominal: Soft. There is no tenderness. There is no rebound.  Genitourinary:  No CVA tenderness  Musculoskeletal: She exhibits no tenderness.  ROM appears intact, no obvious focal weakness  Neurological: She is alert and oriented to person, place, and time.  Mental status and motor strength appears intact  Skin: No rash noted.  Psychiatric: She has a normal mood and affect.    ED Course  Procedures (including critical care time)  1:22 PM Patient presents with nausea and vomiting. UA shows evidence of urinary tract infection. She is mildly tachycardic however appears to be nontoxic. IV fluid, and antibiotic given. Will continue to monitor.   3:42 PM Patient felt much better after receiving treatment. She is able to tolerates by mouth. Her vital signs normalized. Patient will be discharge with Keflex, and Zofran as antinausea medication. Return precautions discussed.  Labs Reviewed  CBC WITH DIFFERENTIAL - Abnormal; Notable for the following:    Hemoglobin 17.0 (*)    HCT 47.6 (*)    All other components within normal limits  COMPREHENSIVE METABOLIC PANEL - Abnormal; Notable for the following:    Sodium 134 (*)  Potassium 3.3 (*)    Chloride 91 (*)    Glucose, Bld 110 (*)    Total Protein 8.7 (*)    GFR calc non Af Amer 70 (*)    GFR calc Af Amer 82 (*)    All other components within normal limits  URINALYSIS, ROUTINE W REFLEX MICROSCOPIC - Abnormal; Notable for the following:    Color, Urine AMBER (*)    APPearance CLOUDY (*)    Specific Gravity, Urine >1.030 (*)    Hgb urine dipstick LARGE (*)    Bilirubin Urine MODERATE  (*)    Ketones, ur 15 (*)    Protein, ur 100 (*)    Nitrite POSITIVE (*)    Leukocytes, UA TRACE (*)    All other components within normal limits  URINE MICROSCOPIC-ADD ON - Abnormal; Notable for the following:    Squamous Epithelial / LPF FEW (*)    Bacteria, UA MANY (*)    All other components within normal limits  URINE CULTURE  LIPASE, BLOOD  POCT PREGNANCY, URINE   No results found.   1. UTI (lower urinary tract infection)       MDM  BP 97/52  Pulse 98  Temp(Src) 97.1 F (36.2 C) (Oral)  Resp 16  SpO2 100%  LMP 12/05/2012  I have reviewed nursing notes and vital signs.   I reviewed available ER/hospitalization records thought the EMR         Fayrene Helper, [place] 01/04/13 1542

## 2013-01-04 NOTE — ED Notes (Signed)
Pt reports n/v for the past 4 days. States she has been unable to keep anything down. Also reports decreased urine output and a dark color to the urine. Denies any abd pain at this time. Denies any flank pain.

## 2013-01-06 LAB — URINE CULTURE

## 2013-01-07 ENCOUNTER — Telehealth (HOSPITAL_COMMUNITY): Payer: Self-pay | Admitting: Emergency Medicine

## 2013-01-07 NOTE — ED Notes (Signed)
+  Urine. Patient treated with Keflex. Sensitive to same. Per protocol MD. °

## 2013-01-07 NOTE — ED Notes (Signed)
Patient has +Urine culture. °

## 2013-03-15 ENCOUNTER — Emergency Department (HOSPITAL_COMMUNITY): Payer: Medicaid Other

## 2013-03-15 ENCOUNTER — Inpatient Hospital Stay (HOSPITAL_COMMUNITY)
Admission: EM | Admit: 2013-03-15 | Discharge: 2013-03-26 | DRG: 193 | Disposition: A | Discharge status: Home / Self Care | Payer: Medicaid Other | Attending: Internal Medicine | Admitting: Internal Medicine

## 2013-03-15 ENCOUNTER — Encounter (HOSPITAL_COMMUNITY): Payer: Self-pay | Admitting: *Deleted

## 2013-03-15 DIAGNOSIS — Z7982 Long term (current) use of aspirin: Secondary | ICD-10-CM

## 2013-03-15 DIAGNOSIS — R002 Palpitations: Secondary | ICD-10-CM | POA: Diagnosis present

## 2013-03-15 DIAGNOSIS — I5040 Unspecified combined systolic (congestive) and diastolic (congestive) heart failure: Secondary | ICD-10-CM

## 2013-03-15 DIAGNOSIS — B373 Candidiasis of vulva and vagina: Secondary | ICD-10-CM | POA: Diagnosis present

## 2013-03-15 DIAGNOSIS — R0682 Tachypnea, not elsewhere classified: Secondary | ICD-10-CM | POA: Diagnosis present

## 2013-03-15 DIAGNOSIS — E876 Hypokalemia: Secondary | ICD-10-CM | POA: Diagnosis present

## 2013-03-15 DIAGNOSIS — F17200 Nicotine dependence, unspecified, uncomplicated: Secondary | ICD-10-CM | POA: Diagnosis present

## 2013-03-15 DIAGNOSIS — I5043 Acute on chronic combined systolic (congestive) and diastolic (congestive) heart failure: Secondary | ICD-10-CM | POA: Diagnosis present

## 2013-03-15 DIAGNOSIS — I471 Supraventricular tachycardia: Secondary | ICD-10-CM | POA: Diagnosis not present

## 2013-03-15 DIAGNOSIS — I498 Other specified cardiac arrhythmias: Secondary | ICD-10-CM | POA: Diagnosis present

## 2013-03-15 DIAGNOSIS — I5031 Acute diastolic (congestive) heart failure: Secondary | ICD-10-CM

## 2013-03-15 DIAGNOSIS — D649 Anemia, unspecified: Secondary | ICD-10-CM | POA: Diagnosis present

## 2013-03-15 DIAGNOSIS — I059 Rheumatic mitral valve disease, unspecified: Secondary | ICD-10-CM

## 2013-03-15 DIAGNOSIS — R6510 Systemic inflammatory response syndrome (SIRS) of non-infectious origin without acute organ dysfunction: Secondary | ICD-10-CM | POA: Diagnosis present

## 2013-03-15 DIAGNOSIS — I050 Rheumatic mitral stenosis: Secondary | ICD-10-CM | POA: Diagnosis present

## 2013-03-15 DIAGNOSIS — J189 Pneumonia, unspecified organism: Principal | ICD-10-CM | POA: Diagnosis present

## 2013-03-15 DIAGNOSIS — K219 Gastro-esophageal reflux disease without esophagitis: Secondary | ICD-10-CM | POA: Diagnosis present

## 2013-03-15 DIAGNOSIS — R0601 Orthopnea: Secondary | ICD-10-CM | POA: Diagnosis present

## 2013-03-15 DIAGNOSIS — R0602 Shortness of breath: Secondary | ICD-10-CM

## 2013-03-15 DIAGNOSIS — E860 Dehydration: Secondary | ICD-10-CM | POA: Diagnosis present

## 2013-03-15 DIAGNOSIS — I330 Acute and subacute infective endocarditis: Secondary | ICD-10-CM | POA: Diagnosis present

## 2013-03-15 DIAGNOSIS — I339 Acute and subacute endocarditis, unspecified: Secondary | ICD-10-CM

## 2013-03-15 DIAGNOSIS — D72829 Elevated white blood cell count, unspecified: Secondary | ICD-10-CM | POA: Diagnosis present

## 2013-03-15 DIAGNOSIS — B3731 Acute candidiasis of vulva and vagina: Secondary | ICD-10-CM | POA: Diagnosis present

## 2013-03-15 DIAGNOSIS — R011 Cardiac murmur, unspecified: Secondary | ICD-10-CM | POA: Diagnosis present

## 2013-03-15 DIAGNOSIS — I052 Rheumatic mitral stenosis with insufficiency: Secondary | ICD-10-CM | POA: Diagnosis present

## 2013-03-15 HISTORY — DX: Gastro-esophageal reflux disease without esophagitis: K21.9

## 2013-03-15 HISTORY — DX: Personal history of other medical treatment: Z92.89

## 2013-03-15 HISTORY — DX: Anemia, unspecified: D64.9

## 2013-03-15 HISTORY — DX: Cardiac murmur, unspecified: R01.1

## 2013-03-15 HISTORY — DX: Acute diastolic (congestive) heart failure: I50.31

## 2013-03-15 LAB — CBC
Hemoglobin: 10.1 g/dL — ABNORMAL LOW (ref 12.0–15.0)
Platelets: 382 10*3/uL (ref 150–400)
Platelets: 372 10*3/uL (ref 150–400)
RBC: 3.39 MIL/uL — ABNORMAL LOW (ref 3.87–5.11)
RBC: 3.2 MIL/uL — ABNORMAL LOW (ref 3.87–5.11)
WBC: 12.3 10*3/uL — ABNORMAL HIGH (ref 4.0–10.5)
WBC: 10.5 10*3/uL (ref 4.0–10.5)

## 2013-03-15 LAB — BASIC METABOLIC PANEL
CO2: 22 mEq/L (ref 19–32)
Chloride: 103 mEq/L (ref 96–112)
GFR calc Af Amer: >90 mL/min (ref >90)
Potassium: 3.9 mEq/L (ref 3.5–5.1)
Sodium: 137 mEq/L (ref 135–145)

## 2013-03-15 LAB — MAGNESIUM: Magnesium: 1.8 mg/dL (ref 1.5–2.5)

## 2013-03-15 LAB — CK TOTAL AND CKMB (NOT AT ARMC)
CK, MB: 1 ng/mL (ref 0.3–4.0)
Total CK: 61 U/L (ref 7–177)

## 2013-03-15 LAB — CREATININE, SERUM
Creatinine, Ser: 0.6 mg/dL (ref 0.50–1.10)
GFR calc non Af Amer: >90 mL/min (ref >90)

## 2013-03-15 LAB — POCT I-STAT TROPONIN I: Troponin i, poc: 0.02 ng/mL (ref 0.00–0.08)

## 2013-03-15 LAB — STREP PNEUMONIAE URINARY ANTIGEN: Strep Pneumo Urinary Antigen: NEGATIVE

## 2013-03-15 LAB — PRO B NATRIURETIC PEPTIDE
Pro B Natriuretic peptide (BNP): 4891 pg/mL — ABNORMAL HIGH (ref 0–125)
Pro B Natriuretic peptide (BNP): 4536 pg/mL — ABNORMAL HIGH (ref 0–125)

## 2013-03-15 MED ORDER — DEXTROSE 5 % IV SOLN
1.0000 g | INTRAVENOUS | Status: DC
  Filled 2013-03-15: qty 10

## 2013-03-15 MED ORDER — SODIUM CHLORIDE 0.9 % IV SOLN
Freq: Once | INTRAVENOUS | Status: AC
  Administered 2013-03-15: 21:00:00 via INTRAVENOUS

## 2013-03-15 MED ORDER — AZITHROMYCIN 250 MG PO TABS
500.0000 mg | ORAL_TABLET | Freq: Once | ORAL | Status: AC
  Administered 2013-03-15: 500 mg via ORAL
  Filled 2013-03-15: qty 2

## 2013-03-15 MED ORDER — SODIUM CHLORIDE 0.9 % IV SOLN: INTRAVENOUS | Status: DC

## 2013-03-15 MED ORDER — CEFTRIAXONE SODIUM 1 G IJ SOLR
1.0000 g | INTRAMUSCULAR | Status: DC
  Administered 2013-03-16 – 2013-03-18 (×3): 1 g via INTRAVENOUS
  Filled 2013-03-15 (×4): qty 10

## 2013-03-15 MED ORDER — ENOXAPARIN SODIUM 40 MG/0.4ML ~~LOC~~ SOLN
40.0000 mg | SUBCUTANEOUS | Status: AC
  Administered 2013-03-15 – 2013-03-21 (×7): 40 mg via SUBCUTANEOUS
  Filled 2013-03-15 (×7): qty 0.4

## 2013-03-15 MED ORDER — ASPIRIN 325 MG PO TABS
325.0000 mg | ORAL_TABLET | Freq: Every day | ORAL | Status: DC
  Administered 2013-03-15 – 2013-03-18 (×4): 325 mg via ORAL
  Filled 2013-03-15 (×5): qty 1

## 2013-03-15 MED ORDER — DEXTROSE 5 % IV SOLN
1.0000 g | Freq: Once | INTRAVENOUS | Status: AC
  Administered 2013-03-15: 1 g via INTRAVENOUS
  Filled 2013-03-15: qty 10

## 2013-03-15 MED ORDER — AZITHROMYCIN 500 MG PO TABS
500.0000 mg | ORAL_TABLET | ORAL | Status: DC
  Administered 2013-03-16 – 2013-03-19 (×4): 500 mg via ORAL
  Filled 2013-03-15 (×7): qty 1

## 2013-03-15 MED ORDER — ACETAMINOPHEN 325 MG PO TABS
650.0000 mg | ORAL_TABLET | Freq: Once | ORAL | Status: AC
  Administered 2013-03-15: 650 mg via ORAL
  Filled 2013-03-15: qty 2

## 2013-03-15 NOTE — Consult Note (Signed)
Reason for Consult: Shortness of breath, elevated BNP, BAE on ECG Referring Physician: Dr. Jennefer Bravo is an 52 y.o. female.  HPI: Ms. Dufault is a 52 yo woman with PMH of tobacco abuse (daily smoker), heart murmur, GERD not currently treated with any medications who had a fever at home as high as 102 with associated SOB and dry cough.In the ER a chest x-ray revealed a RML/RLL infiltrate with an associated elevated wbc and she was admitted to medicine for community acquired pneumonia. Her ECG revealed sinus tachycardia with BAE along with an elevated BNP and bilateral edema leading to cardiology consultation for potential heart failure symptoms.  She has a known murmur since childhood but no recent (if ever) medical evaluation. She describes her cough as non-productive and she also has had some congestion in her nose over the same last 3 days. On further questioning she actually noticed some SOB with exertion last week doing typical activity that would not normally limit her. She walks and works constantly for a [employer] without difficulty. She has no chest pain with exertion, no lightheadedness or syncope. She is an everyday smoker but her current cough/SOB has led her no to smoke over the last few days. Fever noted today. No myalgias last week. She's never had an echo - she says she was told she had a murmur at/near birth without ever having further evaluation. She continues to have regular menses   Past Medical History  Diagnosis Date  . Heart murmur   . CAP (community acquired pneumonia) 03/15/2013    "maybe; that's what they are thinking today" (03/15/2013)  . Asthma     "as a baby"   . Orthopnoea 03/15/2013    "in the last 2 days" (03/15/2013)  . Anemia   . History of blood transfusion     "14 w/1st pregnancy; 2 w/last C-section" (03/15/2013)  . GERD (gastroesophageal reflux disease)     Past Surgical History  Procedure Laterality Date  . Appendectomy  ~1978  . Cesarean  section  1983; 1999  . Cholecystectomy  2000's  . Tubal ligation  1999    History reviewed. No pertinent family history. Mother and father alive in 46s, no known CAD Social History:  reports that she has been smoking Cigarettes.  She has a 18 pack-year smoking history. She has never used smokeless tobacco. She reports that she drinks about 3.6 ounces of alcohol per week. She reports that she does not use illicit drugs.  Allergies: No Known Allergies  Medications:  I have reviewed the patient's current medications. Prior to Admission:  No prescriptions prior to admission   Scheduled: . aspirin  325 mg Oral Daily  . azithromycin  500 mg Oral Q24H  . [START ON 03/16/2013] cefTRIAXone (ROCEPHIN)  IV  1 g Intravenous Q24H  . enoxaparin (LOVENOX) injection  40 mg Subcutaneous Q24H    Results for orders placed during the hospital encounter of 03/15/13 (from the past 48 hour(s))  CBC     Status: Abnormal   Collection Time    03/15/13  3:02 PM      Result Value Range   WBC 12.3 (*) 4.0 - 10.5 K/uL   RBC 3.39 (*) 3.87 - 5.11 MIL/uL   Hemoglobin 11.0 (*) 12.0 - 15.0 g/dL   HCT 16.1 (*) 09.6 - 04.5 %   MCV 91.2  78.0 - 100.0 fL   MCH 32.4  26.0 - 34.0 pg   MCHC 35.6  30.0 -  36.0 g/dL   RDW 96.0  45.4 - 09.8 %   Platelets 382  150 - 400 K/uL  BASIC METABOLIC PANEL     Status: None   Collection Time    03/15/13  3:02 PM      Result Value Range   Sodium 137  135 - 145 mEq/L   Potassium 3.9  3.5 - 5.1 mEq/L   Chloride 103  96 - 112 mEq/L   CO2 22  19 - 32 mEq/L   Glucose, Bld 98  70 - 99 mg/dL   BUN 8  6 - 23 mg/dL   Creatinine, Ser 1.19  0.50 - 1.10 mg/dL   Calcium 9.2  8.4 - 14.7 mg/dL   GFR calc non Af Amer >90  >90 mL/min   GFR calc Af Amer >90  >90 mL/min   Comment:            The eGFR has been calculated     using the CKD EPI equation.     This calculation has not been     validated in all clinical     situations.     eGFR's persistently     <90 mL/min signify      possible Chronic Kidney Disease.  PRO B NATRIURETIC PEPTIDE     Status: Abnormal   Collection Time    03/15/13  3:02 PM      Result Value Range   Pro B Natriuretic peptide (BNP) 4891.0 (*) 0 - 125 pg/mL  POCT I-STAT TROPONIN I     Status: None   Collection Time    03/15/13  3:12 PM      Result Value Range   Troponin i, poc 0.02  0.00 - 0.08 ng/mL   Comment 3            Comment: Due to the release kinetics of cTnI,     a negative result within the first hours     of the onset of symptoms does not rule out     myocardial infarction with certainty.     If myocardial infarction is still suspected,     repeat the test at appropriate intervals.  CK TOTAL AND CKMB     Status: None   Collection Time    03/15/13  5:28 PM      Result Value Range   Total CK 61  7 - 177 U/L   CK, MB 1.0  0.3 - 4.0 ng/mL   Relative Index RELATIVE INDEX IS INVALID  0.0 - 2.5   Comment: WHEN CK < 100 U/L             CBC     Status: Abnormal   Collection Time    03/15/13  8:23 PM      Result Value Range   WBC 10.5  4.0 - 10.5 K/uL   RBC 3.20 (*) 3.87 - 5.11 MIL/uL   Hemoglobin 10.1 (*) 12.0 - 15.0 g/dL   HCT 82.9 (*) 56.2 - 13.0 %   MCV 91.6  78.0 - 100.0 fL   MCH 31.6  26.0 - 34.0 pg   MCHC 34.5  30.0 - 36.0 g/dL   RDW 86.5  78.4 - 69.6 %   Platelets 372  150 - 400 K/uL  CREATININE, SERUM     Status: None   Collection Time    03/15/13  8:23 PM      Result Value Range   Creatinine, Ser 0.60  0.50 -  1.10 mg/dL   GFR calc non Af Amer >90  >90 mL/min   GFR calc Af Amer >90  >90 mL/min   Comment:            The eGFR has been calculated     using the CKD EPI equation.     This calculation has not been     validated in all clinical     situations.     eGFR's persistently     <90 mL/min signify     possible Chronic Kidney Disease.  PRO B NATRIURETIC PEPTIDE     Status: Abnormal   Collection Time    03/15/13  8:23 PM      Result Value Range   Pro B Natriuretic peptide (BNP) 4536.0 (*) 0 - 125  pg/mL  MAGNESIUM     Status: None   Collection Time    03/15/13  8:23 PM      Result Value Range   Magnesium 1.8  1.5 - 2.5 mg/dL  PHOSPHORUS     Status: None   Collection Time    03/15/13  8:23 PM      Result Value Range   Phosphorus 2.7  2.3 - 4.6 mg/dL    Dg Chest 2 View  1/61/0960   *RADIOLOGY REPORT*  Clinical Data: Fever and cough  CHEST - 2 VIEW  Comparison: None.  Findings: There is patchy airspace opacity at the right lung base with coarse reticular opacities at both lung bases.  The lungs are otherwise clear.  No pleural effusion or pneumothorax.  Cardiac silhouette is mildly enlarged.  No mediastinal or hilar masses.  IMPRESSION: Right lung base infiltrate.  This projects in the right middle lobe with some also suspected in the right lower lobe.   Original Report Authenticated By: Amie Portland, M.D.    Review of Systems  Constitutional: Positive for fever and weight loss. Negative for diaphoresis.       10-13 lb weight loss  HENT: Positive for congestion. Negative for hearing loss, neck pain and tinnitus.   Eyes: Negative for double vision, photophobia and pain.  Respiratory: Positive for cough and shortness of breath. Negative for hemoptysis and wheezing.   Cardiovascular: Positive for orthopnea, leg swelling and PND. Negative for chest pain and palpitations.  Gastrointestinal: Negative for heartburn, nausea, vomiting, constipation and blood in stool.  Genitourinary: Negative for dysuria, urgency and frequency.  Musculoskeletal: Negative for myalgias and back pain.  Neurological: Negative for tingling and headaches.  Endo/Heme/Allergies: Negative for polydipsia. Does not bruise/bleed easily.  Psychiatric/Behavioral: Negative for depression, suicidal ideas and substance abuse.   Blood pressure 108/69, pulse 88, temperature 98.5 F (36.9 C), temperature source Oral, resp. rate 20, height 5\' 2"  (1.575 m), weight 51.483 kg (113 lb 8 oz), last menstrual period 02/19/2013, SpO2  95.00%. Physical Exam  Nursing note and vitals reviewed. Constitutional: She is oriented to person, place, and time. She appears well-developed and well-nourished. No distress.  HENT:  Head: Normocephalic and atraumatic.  Nose: Nose normal.  Mouth/Throat: Oropharynx is clear and moist. No oropharyngeal exudate.  Eyes: Conjunctivae and EOM are normal. Pupils are equal, round, and reactive to light. No scleral icterus.  Neck: Normal range of motion. Neck supple. JVD present. No tracheal deviation present. No thyromegaly present.  midneck at 60 degrees, +HJR  Cardiovascular: Regular rhythm and intact distal pulses.  Exam reveals no gallop.   Murmur heard. SEM noted at LLSB and more prominent at apex into axilla. Mildly tachycardic  Respiratory: Effort normal. No respiratory distress. She has no wheezes. She has rales.  scattered occasional rales at the bases; RML/RLL > Left lung bases  GI: Soft. Bowel sounds are normal. She exhibits no distension. There is no tenderness.  Musculoskeletal: Normal range of motion. She exhibits no edema and no tenderness.  Neurological: She is alert and oriented to person, place, and time. No cranial nerve deficit. Coordination normal.  Skin: Skin is warm and dry. No rash noted. She is not diaphoretic. No erythema.  Psychiatric: She has a normal mood and affect. Her behavior is normal.    Labs reviewed as above; na 137, K 3.9, bun/cr 8/0.6, plt 372, h/h 10.1/29.3, wbc 12.5 --> 10.5, mcv 91 ECG reviewed by me; sinus tachycardia; BAE, q in V3 Chest x-ray reviewed by me; RLL/RML infiltrate; increased pulmonary vascularity, borderline cardiomegaly  Problem List Shortness of breath/Leukocytosis/CAP Elevated BNP with some pulmonary vascular congestion Sinus tachycardia Tobacco abuse Anemia SEM at LLSB (slight) and more prominent at apex  Assessment/Plan: 52 yo woman with history of tobacco use who comes in with shortness of breath, fever, cough and found to  have presumed CAP. Cardiology consulted for elevated BNP, BAE on ECG, known childhood murmur with limited previous evaluation (if any) and borderline cardiomegaly on chest x-ray concerning for heart failure. I also note some HJR/JVP as well as new SOB last week with activity. If this is in fact heart failure, the differential diagnosis is long-standing valvular disease (exam consistent with mild TR and moderate MR), VSD, viral myocarditis, hyperthyroidism, other infections, idiopathic cardiomegaly, less likely ischemic etiology and high circulating volume. She does note some weight loss. I favor a diagnosis of idiopathic (? Viral but not classic prodrome with some SOB last week but febrile symptoms tonight) cardiomyopathy vs. Valvular heart failure worsening in setting of CAP. At this time she is hemodynamically stable. The key will be confirmation of examination and laboratory findings with an echocardiogram in the AM. If there is evidence of LV dysfunction then initiation of ace-/bb will be prudent as well as close follow-up with cardiology.   - please add on TSH, HIV, lipid panel - echocardiogram in AM (I placed the order) - trending troponins is reasonable - continue on telemetry - agree with treatment for CAP as you are - I would limit IV fluids - smoking cessation   Psalm Arman 03/15/2013, 9:52 PM

## 2013-03-15 NOTE — ED Provider Notes (Signed)
History     CSN: [id number]  Arrival date & time 03/15/13  1434   First MD Initiated Contact with Patient 03/15/13 1601      Chief Complaint  Patient presents with  . Fever  . Cough  . Shortness of Breath    (Consider location/radiation/quality/duration/timing/severity/associated sxs/prior treatment) HPI Comments: Patient presents with  fever, cough, and SOB.  Symptoms have been present for the past 3 days and are gradually worsening.  She reports that her cough is non productive.  She has also had some associated nasal congestion. She has not taken anything for symptoms prior to arrival.  Patient denies any chest pain.  Denies lower extremity edema.  She reports that she currently smokes daily.  She denies any history of COPD or CHF.  She does not currently have a PCP.   She denies any hospitalizations in the past 90 days.  She currently lives at home.    Patient is a 52 y.o. female presenting with cough and shortness of breath. The history is provided by the patient.  Cough Cough characteristics:  Non-productive Context: not sick contacts   Relieved by:  None tried Worsened by:  Lying down Associated symptoms: fever, rhinorrhea and shortness of breath   Associated symptoms: no chest pain, no chills, no diaphoresis, no ear pain, no sinus congestion, no sore throat and no wheezing   Shortness of Breath Associated symptoms: cough and fever   Associated symptoms: no chest pain, no diaphoresis, no ear pain, no sore throat and no wheezing     History reviewed. No pertinent past medical history.  Past Surgical History  Procedure Laterality Date  . Appendectomy    . Cesarean section    . Cholecystectomy      History reviewed. No pertinent family history.  History  Substance Use Topics  . Smoking status: Current Every Day Smoker  . Smokeless tobacco: Not on file  . Alcohol Use: Yes    OB History   Grav Para Term Preterm Abortions TAB SAB Ect Mult Living                   Review of Systems  Constitutional: Positive for fever. Negative for chills and diaphoresis.  HENT: Positive for rhinorrhea. Negative for ear pain and sore throat.   Respiratory: Positive for cough and shortness of breath. Negative for wheezing.   Cardiovascular: Negative for chest pain.  All other systems reviewed and are negative.    Allergies  Review of patient's allergies indicates no known allergies.  Home Medications  No current outpatient prescriptions on file.  BP 114/76  Pulse 98  Temp(Src) 99.2 F (37.3 C) (Oral)  Resp 18  SpO2 97%  LMP 02/19/2013  Physical Exam  Nursing note and vitals reviewed. Constitutional: She appears well-developed and well-nourished. No distress.  HENT:  Head: Normocephalic and atraumatic.  Mouth/Throat: Oropharynx is clear and moist.  Neck: Normal range of motion. Neck supple.  Cardiovascular: Normal rate, regular rhythm and normal heart sounds.   Pulmonary/Chest: Effort normal. No respiratory distress. She has no wheezes. She has rales.  Abdominal: Soft. She exhibits no distension.  Musculoskeletal:  No LE edema  Neurological: She is alert.  Skin: Skin is warm and dry. She is not diaphoretic.  Psychiatric: She has a normal mood and affect.    ED Course  Procedures (including critical care time)  Labs Reviewed  CBC - Abnormal; Notable for the following:    WBC 12.3 (*)    RBC  3.39 (*)    Hemoglobin 11.0 (*)    HCT 30.9 (*)    All other components within normal limits  PRO B NATRIURETIC PEPTIDE - Abnormal; Notable for the following:    Pro B Natriuretic peptide (BNP) 4891.0 (*)    All other components within normal limits  BASIC METABOLIC PANEL  POCT I-STAT TROPONIN I   Dg Chest 2 View  03/15/2013   *RADIOLOGY REPORT*  Clinical Data: Fever and cough  CHEST - 2 VIEW  Comparison: None.  Findings: There is patchy airspace opacity at the right lung base with coarse reticular opacities at both lung bases.  The lungs are  otherwise clear.  No pleural effusion or pneumothorax.  Cardiac silhouette is mildly enlarged.  No mediastinal or hilar masses.  IMPRESSION: Right lung base infiltrate.  This projects in the right middle lobe with some also suspected in the right lower lobe.   Original Report Authenticated By: Amie Portland, M.D.     No diagnosis found.  Patient discussed with Dr. Joseph Art with [facility] who has agreed to admit the patient.  He is requesting a CKMB and also a CT chest.  MDM  Patient presents today with fever, cough, and SOB.  CXR showing right lung base infiltrate.  Patient has not had any prior hospitalizations in the past 90 days.  Therefore, patient treated for CAP with Ceftriaxone and Azithromycin.  However, her BNP was ordered in triage and was found to be 4891.  No prior history of CHF.  Patient therefore, admitted to Hospitalist service for additional management and probable Echocardiogram.          Pascal Lux [place], PA-C 03/15/13 [phone]

## 2013-03-15 NOTE — ED Notes (Signed)
Pt reports not feeling well x 2 days with fever and diarrhea. Started vomiting today. Having non productive cough. Pt reports feeling very sob, constricted feeling in her chest. Airway intact at triage.

## 2013-03-15 NOTE — H&P (Signed)
[facility] History and Physical  Mary [name]:[id number] DOB: [birth year] DOA: 03/15/2013  Referring physician: ED PCP: No PCP Per Patient  Specialists: Dr. Tresa Endo (cardiology fellow to consult)  Chief Complaint: Increasing shortness of breath, fever  HPI: Mary [name] is a 52 y.o. BF PMHx asthma as a child, no current history, on no medication. Presented to ED positive today's of fever at home measured at 102, positive increasing shortness of breath, positive dry cough, negative chest pain, positive bilateral lower extremity edema to knees which resolved after patient elevate feet over last 2 days negative sick contacts, negative travel outside the [place] has cardiac murmur since childhood which has never been evaluated. presents with fever, cough, and SOB. Symptoms have been present for the past 3 days and are gradually worsening. She reports that her cough is non productive. She has also had some associated nasal congestion. She has not taken anything for symptoms prior to arrival. Patient denies any chest pain. Denies lower extremity edema. She reports that she currently smokes daily. She denies any history of COPD or CHF. She does not currently have a PCP. She denies any hospitalizations in the past 90 days. She currently lives at home.     Review of Systems: The patient denies anorexia,  weight loss,, vision loss, decreased hearing, hoarseness, chest pain, syncope, dyspnea on exertion, , balance deficits, hemoptysis, abdominal pain, melena, hematochezia, severe indigestion/heartburn, hematuria, incontinence, genital sores, muscle weakness, suspicious skin lesions, transient blindness, difficulty walking, depression, unusual weight change, abnormal bleeding, enlarged lymph nodes, angioedema, and breast masses.   Positive shortness of breath, fever,peripheral edema   Past Medical History  Diagnosis Date  . Heart murmur   . CAP (community acquired pneumonia) 03/15/2013     "maybe; that's what they are thinking today" (03/15/2013)  . Asthma     "as a baby"   . Orthopnoea 03/15/2013    "in the last 2 days" (03/15/2013)  . Anemia   . History of blood transfusion     "14 w/1st pregnancy; 2 w/last C-section" (03/15/2013)  . GERD (gastroesophageal reflux disease)    Past Surgical History  Procedure Laterality Date  . Appendectomy  ~1978  . Cesarean section  1983; 1999  . Cholecystectomy  2000's  . Tubal ligation  1999   Social History:  reports positive smoking half a pack per day x35 years, negative smokeless tobacco. She reports that she drinks about 3.6 ounces of alcohol per week. She reports that she does not use illicit drugs.  No Known Allergies  History reviewed. No pertinent family history. father unknown type of cancer  Prior to Admission medications   Not on File   Physical Exam: Filed Vitals:   03/15/13 1745 03/15/13 1800 03/15/13 1836 03/15/13 2022  BP: 117/77 107/79 115/77 108/69  Pulse: 102 97 101 88  Temp: 99.2 F (37.3 C)  99.8 F (37.7 C) 98.5 F (36.9 C)  TempSrc: Oral  Oral Oral  Resp: 28 27 22 20   Height:    5\' 2"  (1.575 m)  Weight:    51.483 kg (113 lb 8 oz)  SpO2: 97% 96% 97% 95%     General: A/O x4  Eyes: PERRLA  Cardiovascular: Regular rhythm, tachycardic, positive holosystolic murmur heard best at the apex grade 3/ 6, negative JVD  Respiratory: Clear to auscultation bilaterally except was appreciated in the right middle lobe/right lower lobe.  Abdomen: Soft nontender nondistended positive bowel sounds  Skin: Decreased skin turgor  Musculoskeletal: Negative  pedal edema  Psychiatric: Not assessed  Neurologic: Intact exam  Labs on Admission:  Basic Metabolic Panel:  Recent Labs Lab 03/15/13 1502  NA 137  K 3.9  CL 103  CO2 22  GLUCOSE 98  BUN 8  CREATININE 0.69  CALCIUM 9.2   Liver Function Tests: No results found for this basename: AST, ALT, ALKPHOS, BILITOT, PROT, ALBUMIN,  in the last 168  hours No results found for this basename: LIPASE, AMYLASE,  in the last 168 hours No results found for this basename: AMMONIA,  in the last 168 hours CBC:  Recent Labs Lab 03/15/13 1502 03/15/13 2023  WBC 12.3* 10.5  HGB 11.0* 10.1*  HCT 30.9* 29.3*  MCV 91.2 91.6  PLT 382 372   Cardiac Enzymes:  Recent Labs Lab 03/15/13 1728  CKTOTAL 61  CKMB 1.0    BNP (last 3 results)  Recent Labs  03/15/13 1502  PROBNP 4891.0*   CBG: No results found for this basename: GLUCAP,  in the last 168 hours  Radiological Exams on Admission: Dg Chest 2 View  03/15/2013   *RADIOLOGY REPORT*  Clinical Data: Fever and cough  CHEST - 2 VIEW  Comparison: None.  Findings: There is patchy airspace opacity at the right lung base with coarse reticular opacities at both lung bases.  The lungs are otherwise clear.  No pleural effusion or pneumothorax.  Cardiac silhouette is mildly enlarged.  No mediastinal or hilar masses.  IMPRESSION: Right lung base infiltrate.  This projects in the right middle lobe with some also suspected in the right lower lobe.   Original Report Authenticated By: Amie Portland, M.D.    EKG: Independently reviewed. No previous EKG for comparison. Sinus tachycardia, normal axis, large biphasic P wave in leads V1 and V2 consistent with left atrial hypertrophy  Assessment/Plan Principal Problem:   CAP (community acquired pneumonia) Active Problems:   Tachypnea   Leucocytosis   Undiagnosed cardiac murmurs   CHF (congestive heart failure)   1. CAP; currently on admission patient meets criteria for SIRS, taken in conjunction with chest x-ray findings patient meets criteria for sepsis. We'll treat cause of infection as CAP. Patient may also mildly dehydrated will the patient 1 L normal saline, followed by continuous IV normal saline at 100 ml/hr. 2. Cardiac murmur; will consult cardiology in order to obtain cardiac echo. 3. CHF; Will repeat BNP, and complete cardiac enzymes in  order to rule out acute cardiac insult. Consult cardiology; spoke with Dr. Tresa Endo the cardiac fellow on call and he has agreed to see patient. 4. Tachypnea; orders for supplemental oxygen placed in order to maintain patient's SpO2 greater than 92%. 5. Leukocytosis; most likely secondary to CAP we'll monitor her patient's response to antibiotic treatment.    Code Status: Full Family Communication: No family at bedside Disposition Plan:   Time spent: 60 minutes  Drema Dallas [facility] Pager [phone]  If 7PM-7AM, please contact night-coverage [email or web address] Password [facility] 03/15/2013, 8:56 PM

## 2013-03-16 ENCOUNTER — Inpatient Hospital Stay (HOSPITAL_COMMUNITY): Payer: Medicaid Other

## 2013-03-16 DIAGNOSIS — J189 Pneumonia, unspecified organism: Principal | ICD-10-CM

## 2013-03-16 DIAGNOSIS — I471 Supraventricular tachycardia, unspecified: Secondary | ICD-10-CM

## 2013-03-16 DIAGNOSIS — I5040 Unspecified combined systolic (congestive) and diastolic (congestive) heart failure: Secondary | ICD-10-CM

## 2013-03-16 DIAGNOSIS — I498 Other specified cardiac arrhythmias: Secondary | ICD-10-CM

## 2013-03-16 DIAGNOSIS — R011 Cardiac murmur, unspecified: Secondary | ICD-10-CM

## 2013-03-16 HISTORY — DX: Supraventricular tachycardia, unspecified: I47.10

## 2013-03-16 HISTORY — DX: Supraventricular tachycardia: I47.1

## 2013-03-16 LAB — LEGIONELLA ANTIGEN, URINE: Legionella Antigen, Urine: NEGATIVE

## 2013-03-16 LAB — CBC WITH DIFFERENTIAL/PLATELET
Basophils Absolute: 0 10*3/uL (ref 0.0–0.1)
Eosinophils Absolute: 0.1 10*3/uL (ref 0.0–0.7)
Eosinophils Relative: 2 % (ref 0–5)
Lymphocytes Relative: 11 % — ABNORMAL LOW (ref 12–46)
Lymphs Abs: 1 10*3/uL (ref 0.7–4.0)
MCH: 32.1 pg (ref 26.0–34.0)
Neutrophils Relative %: 81 % — ABNORMAL HIGH (ref 43–77)
Platelets: 331 10*3/uL (ref 150–400)
RBC: 3.05 MIL/uL — ABNORMAL LOW (ref 3.87–5.11)
RDW: 15.5 % (ref 11.5–15.5)
WBC: 8.5 10*3/uL (ref 4.0–10.5)

## 2013-03-16 MED ORDER — ADENOSINE 6 MG/2ML IV SOLN
6.0000 mg | INTRAVENOUS | Status: AC
  Administered 2013-03-16: 6 mg via INTRAVENOUS

## 2013-03-16 MED ORDER — METOPROLOL SUCCINATE ER 25 MG PO TB24
25.0000 mg | ORAL_TABLET | Freq: Every day | ORAL | Status: DC
  Administered 2013-03-16 – 2013-03-17 (×2): 25 mg via ORAL
  Filled 2013-03-16 (×3): qty 1

## 2013-03-16 NOTE — Progress Notes (Addendum)
PATIENT DETAILS Name: Mary [name] Age: 52 y.o. Sex: female Date of Birth: 1960-10-28 Admit Date: 03/15/2013 Admitting Physician Drema Dallas, MD PCP:No PCP Per Patient  Subjective: Well, had episode of SVT this morning.  Assessment/Plan: Principal Problem:   CAP (community acquired pneumonia) - Low-grade fever overnight, however clinically better. Mild leukocytosis has resolved - Continue with empiric Rocephin and Zithromax - Urine streptococcal antigen negative - Blood culture on 6/20 pending  Active Problems: Supraventricular tachycardia - Per patient, she gets episodes of palpitations very frequently at home - Suspect, this is not a new issue - Successfully converted back to sinus rhythm with just 6 mg of adenosine - Now started on Toprol XL 25 mg  Possible mild acute congestive heart failure - Not known whether systolic or diastolic-await echocardiogram - Clinically, slightly on hypervolemic side - Stop IV fluids, do not think patient needs diuretics at this time. - Appreciate cardiology consultation  Heart murmur - Await echocardiogram  Disposition: Remain inpatient  DVT Prophylaxis: Prophylactic Lovenox   Code Status: Full code   Family Communication None  Procedures:  None  CONSULTS:  cardiology   MEDICATIONS: Scheduled Meds: . aspirin  325 mg Oral Daily  . azithromycin  500 mg Oral Q24H  . cefTRIAXone (ROCEPHIN)  IV  1 g Intravenous Q24H  . enoxaparin (LOVENOX) injection  40 mg Subcutaneous Q24H  . metoprolol succinate  25 mg Oral Daily   Continuous Infusions: . sodium chloride     PRN Meds:.  Antibiotics: Anti-infectives   Start     Dose/Rate Route Frequency Ordered Stop   03/16/13 1800  cefTRIAXone (ROCEPHIN) 1 g in dextrose 5 % 50 mL IVPB     1 g 100 mL/hr over 30 Minutes Intravenous Every 24 hours 03/15/13 2128 03/22/13 1759   03/15/13 2015  cefTRIAXone (ROCEPHIN) 1 g in dextrose 5 % 50 mL IVPB  Status:  Discontinued     1  g 100 mL/hr over 30 Minutes Intravenous Every 24 hours 03/15/13 2002 03/15/13 2128   03/15/13 2015  azithromycin (ZITHROMAX) tablet 500 mg     500 mg Oral Every 24 hours 03/15/13 2002 03/22/13 1959   03/15/13 1715  cefTRIAXone (ROCEPHIN) 1 g in dextrose 5 % 50 mL IVPB     1 g 100 mL/hr over 30 Minutes Intravenous  Once 03/15/13 1702 03/15/13 1800   03/15/13 1715  azithromycin (ZITHROMAX) tablet 500 mg     500 mg Oral  Once 03/15/13 1702 03/15/13 1728       PHYSICAL EXAM: Vital signs in last 24 hours: Filed Vitals:   03/15/13 1836 03/15/13 2022 03/16/13 0515 03/16/13 0832  BP: 115/77 108/69 110/74 112/81  Pulse: 101 88 100 180  Temp: 99.8 F (37.7 C) 98.5 F (36.9 C) 99.8 F (37.7 C) 98.5 F (36.9 C)  TempSrc: Oral Oral Oral Oral  Resp: 22 20 18 20   Height:  5\' 2"  (1.575 m)    Weight:  51.483 kg (113 lb 8 oz)    SpO2: 97% 95% 95% 98%    Weight change:  Filed Weights   03/15/13 2022  Weight: 51.483 kg (113 lb 8 oz)   Body mass index is 20.75 kg/(m^2).   Gen Exam: Awake and alert with clear speech.   Neck: Supple, No JVD.   Chest: B/L Clear.   CVS: S1 S2 Regular, no murmurs.  Abdomen: soft, BS +, non tender, non distended.  Extremities: no edema, lower extremities warm to touch. Neurologic: Non Focal.  Skin: No Rash.   Wounds: N/A.    Intake/Output from previous day:  Intake/Output Summary (Last 24 hours) at 03/16/13 1035 Last data filed at 03/16/13 0600  Gross per 24 hour  Intake 1483.33 ml  Output      0 ml  Net 1483.33 ml     LAB RESULTS: CBC  Recent Labs Lab 03/15/13 1502 03/15/13 2023 03/16/13 0500  WBC 12.3* 10.5 8.5  HGB 11.0* 10.1* 9.8*  HCT 30.9* 29.3* 28.1*  PLT 382 372 331  MCV 91.2 91.6 92.1  MCH 32.4 31.6 32.1  MCHC 35.6 34.5 34.9  RDW 15.2 15.3 15.5  LYMPHSABS  --   --  1.0  MONOABS  --   --  0.5  EOSABS  --   --  0.1  BASOSABS  --   --  0.0    Chemistries   Recent Labs Lab 03/15/13 1502 03/15/13 2023  NA 137  --   K  3.9  --   CL 103  --   CO2 22  --   GLUCOSE 98  --   BUN 8  --   CREATININE 0.69 0.60  CALCIUM 9.2  --   MG  --  1.8    CBG: No results found for this basename: GLUCAP,  in the last 168 hours  GFR Estimated Creatinine Clearance: 66.5 ml/min (by C-G formula based on Cr of 0.6).  Coagulation profile No results found for this basename: INR, PROTIME,  in the last 168 hours  Cardiac Enzymes  Recent Labs Lab 03/15/13 1728  CKMB 1.0    No components found with this basename: POCBNP,  No results found for this basename: DDIMER,  in the last 72 hours No results found for this basename: HGBA1C,  in the last 72 hours No results found for this basename: CHOL, HDL, LDLCALC, TRIG, CHOLHDL, LDLDIRECT,  in the last 72 hours No results found for this basename: TSH, T4TOTAL, FREET3, T3FREE, THYROIDAB,  in the last 72 hours No results found for this basename: VITAMINB12, FOLATE, FERRITIN, TIBC, IRON, RETICCTPCT,  in the last 72 hours No results found for this basename: LIPASE, AMYLASE,  in the last 72 hours  Urine Studies No results found for this basename: UACOL, UAPR, USPG, UPH, UTP, UGL, UKET, UBIL, UHGB, UNIT, UROB, ULEU, UEPI, UWBC, URBC, UBAC, CAST, CRYS, UCOM, BILUA,  in the last 72 hours  MICROBIOLOGY: No results found for this or any previous visit (from the past 240 hour(s)).  RADIOLOGY STUDIES/RESULTS: Dg Chest 2 View  03/15/2013   *RADIOLOGY REPORT*  Clinical Data: Fever and cough  CHEST - 2 VIEW  Comparison: None.  Findings: There is patchy airspace opacity at the right lung base with coarse reticular opacities at both lung bases.  The lungs are otherwise clear.  No pleural effusion or pneumothorax.  Cardiac silhouette is mildly enlarged.  No mediastinal or hilar masses.  IMPRESSION: Right lung base infiltrate.  This projects in the right middle lobe with some also suspected in the right lower lobe.   Original Report Authenticated By: Amie Portland, M.D.    Jeoffrey Massed,  MD  [facility] Pager:[phone]  If 7PM-7AM, please contact night-coverage [email or web address] Password [email or web address] 03/16/2013, 10:35 AM   LOS: 1 day

## 2013-03-16 NOTE — Significant Event (Addendum)
Rapid Response Event Note  Overview: called for HR 170-190's Time Called: 0830 Arrival Time: 1610    Initial Focused Assessment:  Upon arrival to bedside, RN and MD at bedside.  Patient alert and oriented, anxious.  HR noted to be 170-180's.  EKG done prior to my arrival.   Interventions:  Patient  Placed on zoll.  6 mg IV adenosine given.  HR broke now  118-124.  BP 118/79, 96% Patient states feels much better.  RN to call if assistance needed   Event Summary:   at      at          [facility], Maryagnes Amos

## 2013-03-16 NOTE — Progress Notes (Signed)
SUBJECTIVE:  Currently complains of palpitations  OBJECTIVE:   Vitals:   Filed Vitals:   03/15/13 1800 03/15/13 1836 03/15/13 2022 03/16/13 0515  BP: 107/79 115/77 108/69 110/74  Pulse: 97 101 88 100  Temp:  99.8 F (37.7 C) 98.5 F (36.9 C) 99.8 F (37.7 C)  TempSrc:  Oral Oral Oral  Resp: 27 22 20 18   Height:   5\' 2"  (1.575 m)   Weight:   51.483 kg (113 lb 8 oz)   SpO2: 96% 97% 95% 95%   I&O's:   Intake/Output Summary (Last 24 hours) at 03/16/13 0829 Last data filed at 03/16/13 0600  Gross per 24 hour  Intake 1483.33 ml  Output      0 ml  Net 1483.33 ml   TELEMETRY: Reviewed telemetry pt in SVT at 170bpm:     PHYSICAL EXAM General: Well developed, well nourished, in no acute distress Head: Eyes PERRLA, No xanthomas.   Normal cephalic and atramatic Lungs:   Clear bilaterally to auscultation and percussion. Heart:   Tachy S1 S2 Pulses are 2+ & equal. Abdomen: Bowel sounds are positive, abdomen soft and non-tender without masses Extremities:   No clubbing, cyanosis or edema.  DP +1 Neuro: Alert and oriented X 3. Psych:  Good affect, responds appropriately   LABS: Basic Metabolic Panel:  Recent Labs  40/98/11 1502 03/15/13 2023  NA 137  --   K 3.9  --   CL 103  --   CO2 22  --   GLUCOSE 98  --   BUN 8  --   CREATININE 0.69 0.60  CALCIUM 9.2  --   MG  --  1.8  PHOS  --  2.7   Liver Function Tests: No results found for this basename: AST, ALT, ALKPHOS, BILITOT, PROT, ALBUMIN,  in the last 72 hours No results found for this basename: LIPASE, AMYLASE,  in the last 72 hours CBC:  Recent Labs  03/15/13 2023 03/16/13 0500  WBC 10.5 8.5  NEUTROABS  --  6.9  HGB 10.1* 9.8*  HCT 29.3* 28.1*  MCV 91.6 92.1  PLT 372 331   Cardiac Enzymes:  Recent Labs  03/15/13 1728  CKTOTAL 61  CKMB 1.0   Coag Panel:   Lab Results  Component Value Date   INR 1.06 08/23/2010    RADIOLOGY: Dg Chest 2 View  03/15/2013   *RADIOLOGY REPORT*  Clinical Data:  Fever and cough  CHEST - 2 VIEW  Comparison: None.  Findings: There is patchy airspace opacity at the right lung base with coarse reticular opacities at both lung bases.  The lungs are otherwise clear.  No pleural effusion or pneumothorax.  Cardiac silhouette is mildly enlarged.  No mediastinal or hilar masses.  IMPRESSION: Right lung base infiltrate.  This projects in the right middle lobe with some also suspected in the right lower lobe.   Original Report Authenticated By: Amie Portland, M.D.   Assessment/Plan:  52 yo woman with history of tobacco use who comes in with shortness of breath, fever, cough and found to have presumed CAP. Cardiology consulted for elevated BNP, BAE on ECG, known childhood murmur with limited previous evaluation (if any) and borderline cardiomegaly on chest x-ray concerning for heart failure. Cardiac enzymes negative thus far.  1.  Acute CHF - echo pending this am.  Cardiac enzymes negative thus far.   2.  CAP on antibiotics 3.  SVT - patient has been having very frequent episodes of palpitations recently.  Adenosine 6mg  IV given at bedside with conversion to NSR - will start on Toprol XL 25mg  daily for suppression of SVT 4.  Tobacco abuse 5.  Heart murmur        Quintella Reichert, MD  03/16/2013  8:29 AM

## 2013-03-17 LAB — BASIC METABOLIC PANEL
BUN: 6 mg/dL (ref 6–23)
Calcium: 8.8 mg/dL (ref 8.4–10.5)
GFR calc Af Amer: >90 mL/min (ref >90)
GFR calc non Af Amer: >90 mL/min (ref >90)
Glucose, Bld: 101 mg/dL — ABNORMAL HIGH (ref 70–99)
Potassium: 3.2 mEq/L — ABNORMAL LOW (ref 3.5–5.1)

## 2013-03-17 LAB — CBC
Hemoglobin: 10.5 g/dL — ABNORMAL LOW (ref 12.0–15.0)
MCH: 31.7 pg (ref 26.0–34.0)
MCHC: 34.2 g/dL (ref 30.0–36.0)
Platelets: 415 10*3/uL — ABNORMAL HIGH (ref 150–400)
RDW: 15.4 % (ref 11.5–15.5)

## 2013-03-17 LAB — MAGNESIUM: Magnesium: 1.7 mg/dL (ref 1.5–2.5)

## 2013-03-17 MED ORDER — POTASSIUM CHLORIDE CRYS ER 20 MEQ PO TBCR
40.0000 meq | EXTENDED_RELEASE_TABLET | Freq: Once | ORAL | Status: AC
  Administered 2013-03-17: 40 meq via ORAL
  Filled 2013-03-17: qty 2

## 2013-03-17 MED ORDER — WHITE PETROLATUM GEL
Status: AC
  Administered 2013-03-17: 0.2
  Filled 2013-03-17: qty 5

## 2013-03-17 MED ORDER — VANCOMYCIN HCL IN DEXTROSE 750-5 MG/150ML-% IV SOLN
750.0000 mg | Freq: Two times a day (BID) | INTRAVENOUS | Status: DC
  Administered 2013-03-17 – 2013-03-26 (×18): 750 mg via INTRAVENOUS
  Filled 2013-03-17 (×20): qty 150

## 2013-03-17 MED ORDER — ACETAMINOPHEN 325 MG PO TABS
650.0000 mg | ORAL_TABLET | Freq: Four times a day (QID) | ORAL | Status: DC | PRN
  Administered 2013-03-18 – 2013-03-19 (×3): 650 mg via ORAL
  Filled 2013-03-17 (×4): qty 2

## 2013-03-17 MED ORDER — MAGNESIUM SULFATE 40 MG/ML IJ SOLN
2.0000 g | Freq: Once | INTRAMUSCULAR | Status: AC
  Administered 2013-03-17: 2 g via INTRAVENOUS
  Filled 2013-03-17: qty 50

## 2013-03-17 NOTE — Progress Notes (Signed)
PATIENT DETAILS Name: Mary [name] Age: 52 y.o. Sex: female Date of Birth: 01-12-1961 Admit Date: 03/15/2013 Admitting Physician Drema Dallas, MD PCP:No PCP Per Patient  Subjective: No complaints  Assessment/Plan: Principal Problem:   CAP (community acquired pneumonia) -With Low-grade fever overnight, however clinically better. Mild leukocytosis has resolved - Continue with empiric Rocephin and Zithromax - Urine streptococcal antigen negative - Blood culture on 6/20 pending  Active Problems: Supraventricular tachycardia - Per patient, she gets episodes of palpitations very frequently at home - Suspect, this is not a new issue - Successfully converted back to sinus rhythm with just 6 mg of adenosine on 6/21 - Now on Toprol XL 25 mg  Episode of Wide complex tachycardia last night -Keep K above 4-therefore will replete -Mg 1.7-will keep above 2-therefore give magnesium Sulfate today -await Echo  Possible mild acute congestive heart failure - Not known whether systolic or diastolic-await echocardiogram - Clinically, slightly on hypervolemic side - Stop IV fluids, do not think patient needs diuretics at this time. - Appreciate cardiology consultation  Heart murmur - Await echocardiogram  Disposition: Remain inpatient-suspect home in next 1-2 days  DVT Prophylaxis: Prophylactic Lovenox   Code Status: Full code   Family Communication None  Procedures:  None  CONSULTS:  cardiology   MEDICATIONS: Scheduled Meds: . aspirin  325 mg Oral Daily  . azithromycin  500 mg Oral Q24H  . cefTRIAXone (ROCEPHIN)  IV  1 g Intravenous Q24H  . enoxaparin (LOVENOX) injection  40 mg Subcutaneous Q24H  . metoprolol succinate  25 mg Oral Daily   Continuous Infusions:   PRN Meds:.  Antibiotics: Anti-infectives   Start     Dose/Rate Route Frequency Ordered Stop   03/16/13 1800  cefTRIAXone (ROCEPHIN) 1 g in dextrose 5 % 50 mL IVPB     1 g 100 mL/hr over 30 Minutes  Intravenous Every 24 hours 03/15/13 2128 03/22/13 1759   03/15/13 2015  cefTRIAXone (ROCEPHIN) 1 g in dextrose 5 % 50 mL IVPB  Status:  Discontinued     1 g 100 mL/hr over 30 Minutes Intravenous Every 24 hours 03/15/13 2002 03/15/13 2128   03/15/13 2015  azithromycin (ZITHROMAX) tablet 500 mg     500 mg Oral Every 24 hours 03/15/13 2002 03/22/13 1959   03/15/13 1715  cefTRIAXone (ROCEPHIN) 1 g in dextrose 5 % 50 mL IVPB     1 g 100 mL/hr over 30 Minutes Intravenous  Once 03/15/13 1702 03/15/13 1800   03/15/13 1715  azithromycin (ZITHROMAX) tablet 500 mg     500 mg Oral  Once 03/15/13 1702 03/15/13 1728       PHYSICAL EXAM: Vital signs in last 24 hours: Filed Vitals:   03/16/13 0832 03/16/13 1359 03/16/13 2126 03/17/13 0517  BP: 112/81 122/85 118/78 116/77  Pulse: 180 85 93 84  Temp: 98.5 F (36.9 C) 97.9 F (36.6 C) 100.2 F (37.9 C) 99.2 F (37.3 C)  TempSrc: Oral Oral Oral Oral  Resp: 20 20 20 16   Height:      Weight:      SpO2: 98% 99% 96% 98%    Weight change:  Filed Weights   03/15/13 2022  Weight: 51.483 kg (113 lb 8 oz)   Body mass index is 20.75 kg/(m^2).   Gen Exam: Awake and alert with clear speech.   Neck: Supple, No JVD.   Chest: B/L Clear.   CVS: S1 S2 Regular, no murmurs.  Abdomen: soft, BS +, non tender, non  distended.  Extremities: no edema, lower extremities warm to touch. Neurologic: Non Focal.  Skin: No Rash.   Wounds: N/A.    Intake/Output from previous day:  Intake/Output Summary (Last 24 hours) at 03/17/13 1033 Last data filed at 03/17/13 6962  Gross per 24 hour  Intake    720 ml  Output      0 ml  Net    720 ml     LAB RESULTS: CBC  Recent Labs Lab 03/15/13 1502 03/15/13 2023 03/16/13 0500 03/17/13 0510  WBC 12.3* 10.5 8.5 7.6  HGB 11.0* 10.1* 9.8* 10.5*  HCT 30.9* 29.3* 28.1* 30.7*  PLT 382 372 331 415*  MCV 91.2 91.6 92.1 92.7  MCH 32.4 31.6 32.1 31.7  MCHC 35.6 34.5 34.9 34.2  RDW 15.2 15.3 15.5 15.4  LYMPHSABS   --   --  1.0  --   MONOABS  --   --  0.5  --   EOSABS  --   --  0.1  --   BASOSABS  --   --  0.0  --     Chemistries   Recent Labs Lab 03/15/13 1502 03/15/13 2023 03/17/13 0510  NA 137  --  133*  K 3.9  --  3.2*  CL 103  --  101  CO2 22  --  22  GLUCOSE 98  --  101*  BUN 8  --  6  CREATININE 0.69 0.60 0.67  CALCIUM 9.2  --  8.8  MG  --  1.8 1.7    CBG: No results found for this basename: GLUCAP,  in the last 168 hours  GFR Estimated Creatinine Clearance: 66.5 ml/min (by C-G formula based on Cr of 0.67).  Coagulation profile No results found for this basename: INR, PROTIME,  in the last 168 hours  Cardiac Enzymes  Recent Labs Lab 03/15/13 1728  CKMB 1.0    No components found with this basename: POCBNP,  No results found for this basename: DDIMER,  in the last 72 hours No results found for this basename: HGBA1C,  in the last 72 hours No results found for this basename: CHOL, HDL, LDLCALC, TRIG, CHOLHDL, LDLDIRECT,  in the last 72 hours No results found for this basename: TSH, T4TOTAL, FREET3, T3FREE, THYROIDAB,  in the last 72 hours No results found for this basename: VITAMINB12, FOLATE, FERRITIN, TIBC, IRON, RETICCTPCT,  in the last 72 hours No results found for this basename: LIPASE, AMYLASE,  in the last 72 hours  Urine Studies No results found for this basename: UACOL, UAPR, USPG, UPH, UTP, UGL, UKET, UBIL, UHGB, UNIT, UROB, ULEU, UEPI, UWBC, URBC, UBAC, CAST, CRYS, UCOM, BILUA,  in the last 72 hours  MICROBIOLOGY: No results found for this or any previous visit (from the past 240 hour(s)).  RADIOLOGY STUDIES/RESULTS: Dg Chest 2 View  03/15/2013   *RADIOLOGY REPORT*  Clinical Data: Fever and cough  CHEST - 2 VIEW  Comparison: None.  Findings: There is patchy airspace opacity at the right lung base with coarse reticular opacities at both lung bases.  The lungs are otherwise clear.  No pleural effusion or pneumothorax.  Cardiac silhouette is mildly enlarged.   No mediastinal or hilar masses.  IMPRESSION: Right lung base infiltrate.  This projects in the right middle lobe with some also suspected in the right lower lobe.   Original Report Authenticated By: Amie Portland, M.D.    Jeoffrey Massed, MD  [facility] Pager:[phone]  If 7PM-7AM, please contact night-coverage  [email or web address] Password [facility] 03/17/2013, 10:33 AM   LOS: 2 days

## 2013-03-17 NOTE — Progress Notes (Signed)
Pt had a few runs of V-tach at 0017. RN checked on pt. Denies SOB, pain, distress, and sweats. Pt is alert and talking on the phone. RN looked at monitor strip and saw few runs of v-tach. Contacting MD.

## 2013-03-17 NOTE — Progress Notes (Signed)
ANTIBIOTIC CONSULT NOTE - INITIAL  Pharmacy Consult for vancomycin Indication: endocarditis  No Known Allergies  Patient Measurements: Height: 5\' 2"  (157.5 cm) Weight: 113 lb 8 oz (51.483 kg) IBW/kg (Calculated) : 50.1  Vital Signs: Temp: 97.5 F (36.4 C) (06/22 1340) Temp src: Oral (06/22 1340) BP: 106/67 mmHg (06/22 1340) Pulse Rate: 92 (06/22 1340) Intake/Output from previous day: 06/21 0701 - 06/22 0700 In: 840 [P.O.:840] Out: -  Intake/Output from this shift: Total I/O In: 360 [P.O.:360] Out: -   Labs:  Recent Labs  03/15/13 1502 03/15/13 2023 03/16/13 0500 03/17/13 0510  WBC 12.3* 10.5 8.5 7.6  HGB 11.0* 10.1* 9.8* 10.5*  PLT 382 372 331 415*  CREATININE 0.69 0.60  --  0.67   Estimated Creatinine Clearance: 66.5 ml/min (by C-G formula based on Cr of 0.67). No results found for this basename: VANCOTROUGH, VANCOPEAK, VANCORANDOM, GENTTROUGH, GENTPEAK, GENTRANDOM, TOBRATROUGH, TOBRAPEAK, TOBRARND, AMIKACINPEAK, AMIKACINTROU, AMIKACIN,  in the last 72 hours   Microbiology: Recent Results (from the past 720 hour(s))  CULTURE, BLOOD (ROUTINE X 2)     Status: None   Collection Time    03/15/13  8:10 PM      Result Value Range Status   Specimen Description BLOOD RIGHT ARM   Final   Special Requests BOTTLES DRAWN AEROBIC AND ANAEROBIC 10CC EACH   Final   Culture  Setup Time 03/16/2013 03:45   Final   Culture     Final   Value:        BLOOD CULTURE RECEIVED NO GROWTH TO DATE CULTURE WILL BE HELD FOR 5 DAYS BEFORE ISSUING A FINAL NEGATIVE REPORT   Report Status PENDING   Incomplete  CULTURE, BLOOD (ROUTINE X 2)     Status: None   Collection Time    03/15/13  8:20 PM      Result Value Range Status   Specimen Description BLOOD RIGHT ARM   Final   Special Requests BOTTLES DRAWN AEROBIC AND ANAEROBIC 10CC EACH   Final   Culture  Setup Time 03/16/2013 03:44   Final   Culture     Final   Value:        BLOOD CULTURE RECEIVED NO GROWTH TO DATE CULTURE WILL BE HELD  FOR 5 DAYS BEFORE ISSUING A FINAL NEGATIVE REPORT   Report Status PENDING   Incomplete    Medical History: Past Medical History  Diagnosis Date  . Heart murmur   . CAP (community acquired pneumonia) 03/15/2013    "maybe; that's what they are thinking today" (03/15/2013)  . Asthma     "as a baby"   . Orthopnoea 03/15/2013    "in the last 2 days" (03/15/2013)  . Anemia   . History of blood transfusion     "14 w/1st pregnancy; 2 w/last C-section" (03/15/2013)  . GERD (gastroesophageal reflux disease)     Medications:  Scheduled:  . aspirin  325 mg Oral Daily  . azithromycin  500 mg Oral Q24H  . cefTRIAXone (ROCEPHIN)  IV  1 g Intravenous Q24H  . enoxaparin (LOVENOX) injection  40 mg Subcutaneous Q24H  . metoprolol succinate  25 mg Oral Daily    Assessment: 52 yo F admitted 6/20 with suspected PNA, now thought to have probable large vegitation on MV with severe MR from ECHO today.  TEE scheduled for am, BCx pending.  Adding vancomycin on to current antibiotic regimen.  6/20 Azithromycin PO >> 6/27 (entered) 6/21 Ceftriaxone >> 6/27 (entered)  6/20 Bld x 2:  pending  Goal of Therapy:  Vancomycin trough level 15-20 mcg/ml  Plan:  - Vancomycin 750 mg IV q12h - Follow up SCr, UOP, cultures, clinical course and adjust as clinically indicated  - F/u TEE, ID consult  Yeimy Brabant L. Illene Bolus, PharmD, BCPS Clinical Pharmacist Pager: [phone] Pharmacy: [phone] 03/17/2013 3:57 PM

## 2013-03-17 NOTE — Progress Notes (Signed)
  Echocardiogram 2D Echocardiogram has been performed.  Mary [name] 03/17/2013, 12:09 PM

## 2013-03-17 NOTE — ED Provider Notes (Signed)
  Medical screening examination/treatment/procedure(s) were performed by non-physician practitioner and as supervising physician I was immediately available for consultation/collaboration.  However, I discussed these at length during the patient's evaluation here.  Reviewed all lab studies, ECG findings with the mid-level provider.  The patient required admission with concerns of pneumonia, elevated BNP.  I saw the ECG, relevant labs and studies - I agree with the interpretation.   Gerhard Munch, MD 03/17/13 [id number]

## 2013-03-17 NOTE — Progress Notes (Signed)
SUBJECTIVE:  No complaints.  Had episode of WCT last night  OBJECTIVE:   Vitals:   Filed Vitals:   03/16/13 0832 03/16/13 1359 03/16/13 2126 03/17/13 0517  BP: 112/81 122/85 118/78 116/77  Pulse: 180 85 93 84  Temp: 98.5 F (36.9 C) 97.9 F (36.6 C) 100.2 F (37.9 C) 99.2 F (37.3 C)  TempSrc: Oral Oral Oral Oral  Resp: 20 20 20 16   Height:      Weight:      SpO2: 98% 99% 96% 98%   I&O's:   Intake/Output Summary (Last 24 hours) at 03/17/13 0847 Last data filed at 03/17/13 1610  Gross per 24 hour  Intake    720 ml  Output      0 ml  Net    720 ml   TELEMETRY: Reviewed telemetry pt in NSR:     PHYSICAL EXAM General: Well developed, well nourished, in no acute distress Head: Eyes PERRLA, No xanthomas.   Normal cephalic and atramatic  Lungs:   Clear bilaterally to auscultation and percussion. Heart:   HRRR S1 S2 Pulses are 2+ & equal. Abdomen: Bowel sounds are positive, abdomen soft and non-tender without masses  Extremities:   No clubbing, cyanosis or edema.  DP +1 Neuro: Alert and oriented X 3. Psych:  Good affect, responds appropriately   LABS: Basic Metabolic Panel:  Recent Labs  96/04/54 1502 03/15/13 2023 03/17/13 0510  NA 137  --  133*  K 3.9  --  3.2*  CL 103  --  101  CO2 22  --  22  GLUCOSE 98  --  101*  BUN 8  --  6  CREATININE 0.69 0.60 0.67  CALCIUM 9.2  --  8.8  MG  --  1.8  --   PHOS  --  2.7  --    Liver Function Tests: No results found for this basename: AST, ALT, ALKPHOS, BILITOT, PROT, ALBUMIN,  in the last 72 hours No results found for this basename: LIPASE, AMYLASE,  in the last 72 hours CBC:  Recent Labs  03/16/13 0500 03/17/13 0510  WBC 8.5 7.6  NEUTROABS 6.9  --   HGB 9.8* 10.5*  HCT 28.1* 30.7*  MCV 92.1 92.7  PLT 331 415*   Cardiac Enzymes:  Recent Labs  03/15/13 1728  CKTOTAL 61  CKMB 1.0   Coag Panel:   Lab Results  Component Value Date   INR 1.06 08/23/2010    RADIOLOGY: Dg Chest 2 View  03/16/2013    *RADIOLOGY REPORT*  Clinical Data: Fever and cough  CHEST - 2 VIEW  Comparison: Chest radiograph 03/15/2013  Findings: Stable cardiac silhouette.  There is patchy bilateral lower lobe air space disease not significantly changed from prior. Trace pleural effusions are again demonstrated.  No pneumothorax.  IMPRESSION: No change in  patchy bibasilar air space disease suggesting pulmonary infection.   Original Report Authenticated By: Genevive Bi, M.D.   Dg Chest 2 View  03/15/2013   *RADIOLOGY REPORT*  Clinical Data: Fever and cough  CHEST - 2 VIEW  Comparison: None.  Findings: There is patchy airspace opacity at the right lung base with coarse reticular opacities at both lung bases.  The lungs are otherwise clear.  No pleural effusion or pneumothorax.  Cardiac silhouette is mildly enlarged.  No mediastinal or hilar masses.  IMPRESSION: Right lung base infiltrate.  This projects in the right middle lobe with some also suspected in the right lower lobe.   Original Report  Authenticated By: Amie Portland, M.D.    Assessment/Plan:  52 yo woman with history of tobacco use who comes in with shortness of breath, fever, cough and found to have presumed CAP. Cardiology consulted for elevated BNP, BAE on ECG, known childhood murmur with limited previous evaluation (if any) and borderline cardiomegaly on chest x-ray concerning for heart failure. Cardiac enzymes negative thus far.  1. Acute CHF - echo pending this am. Cardiac enzymes negative 2. CAP on antibiotics  3. SVT - patient has been having very frequent episodes of palpitations recently.  SVT noted yesterday at 170bpm which broke with Adenosine 6mg  IV given at bedside with conversion to NSR -  Toprol XL 25mg  daily started  for suppression of SVT  4. Tobacco abuse  5. Heart murmur - echo pending 6. Hypokalemia - repleted this am 7.  Anemia 8.  Wide complex tachycardia - ? SVT with aberration vs. vtach in setting of hypokalemia.  Will check EF on echo.  Potassium repleted this am - if any recurrent arrhythmias may need to increase beta blocker.      Quintella Reichert, MD  03/17/2013  8:47 AM

## 2013-03-17 NOTE — Progress Notes (Addendum)
   TTE reviewed with Dr. Mayford Knife. 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 TEE will be scheduled for tomorrow. BCX pending. Will add vancomycin.  Consider ID consultation. May need empiric gentamicin.   Mary [name] 3:43 PM

## 2013-03-18 ENCOUNTER — Encounter (HOSPITAL_COMMUNITY)
Admission: EM | Disposition: A | Discharge status: Home / Self Care | Payer: Self-pay | Source: Home / Self Care | Attending: Internal Medicine

## 2013-03-18 ENCOUNTER — Encounter (HOSPITAL_COMMUNITY): Payer: Self-pay | Admitting: *Deleted

## 2013-03-18 DIAGNOSIS — I5031 Acute diastolic (congestive) heart failure: Secondary | ICD-10-CM

## 2013-03-18 DIAGNOSIS — I339 Acute and subacute endocarditis, unspecified: Secondary | ICD-10-CM

## 2013-03-18 DIAGNOSIS — I059 Rheumatic mitral valve disease, unspecified: Secondary | ICD-10-CM

## 2013-03-18 HISTORY — PX: TEE WITHOUT CARDIOVERSION: SHX5443

## 2013-03-18 SURGERY — ECHOCARDIOGRAM, TRANSESOPHAGEAL
Anesthesia: Moderate Sedation

## 2013-03-18 MED ORDER — FENTANYL CITRATE 0.05 MG/ML IJ SOLN
INTRAMUSCULAR | Status: DC | PRN
  Administered 2013-03-18 (×2): 25 ug via INTRAVENOUS

## 2013-03-18 MED ORDER — POTASSIUM CHLORIDE 10 MEQ/100ML IV SOLN
10.0000 meq | INTRAVENOUS | Status: AC
  Administered 2013-03-18: 10 meq via INTRAVENOUS
  Filled 2013-03-18 (×3): qty 100

## 2013-03-18 MED ORDER — MIDAZOLAM HCL 10 MG/2ML IJ SOLN
INTRAMUSCULAR | Status: DC | PRN
  Administered 2013-03-18 (×2): 2 mg via INTRAVENOUS

## 2013-03-18 MED ORDER — MAGNESIUM SULFATE 40 MG/ML IJ SOLN
2.0000 g | Freq: Once | INTRAMUSCULAR | Status: AC
  Administered 2013-03-18: 2 g via INTRAVENOUS
  Filled 2013-03-18 (×2): qty 50

## 2013-03-18 MED ORDER — DIPHENHYDRAMINE HCL 50 MG/ML IJ SOLN
INTRAMUSCULAR | Status: AC
  Filled 2013-03-18: qty 1

## 2013-03-18 MED ORDER — BUTAMBEN-TETRACAINE-BENZOCAINE 2-2-14 % EX AERO
INHALATION_SPRAY | CUTANEOUS | Status: DC | PRN
  Administered 2013-03-18: 2 via TOPICAL

## 2013-03-18 MED ORDER — METOPROLOL SUCCINATE ER 50 MG PO TB24
50.0000 mg | ORAL_TABLET | Freq: Every day | ORAL | Status: DC
  Administered 2013-03-18 – 2013-03-26 (×8): 50 mg via ORAL
  Filled 2013-03-18 (×9): qty 1

## 2013-03-18 MED ORDER — POTASSIUM CHLORIDE 10 MEQ/100ML IV SOLN
10.0000 meq | INTRAVENOUS | Status: AC
  Administered 2013-03-18: 10 meq via INTRAVENOUS
  Filled 2013-03-18: qty 100

## 2013-03-18 MED ORDER — FENTANYL CITRATE 0.05 MG/ML IJ SOLN
INTRAMUSCULAR | Status: AC
  Filled 2013-03-18: qty 4

## 2013-03-18 MED ORDER — MIDAZOLAM HCL 5 MG/ML IJ SOLN
INTRAMUSCULAR | Status: AC
  Filled 2013-03-18: qty 2

## 2013-03-18 MED ORDER — SODIUM CHLORIDE 0.9 % IV SOLN: INTRAVENOUS | Status: DC

## 2013-03-18 NOTE — Progress Notes (Signed)
PATIENT DETAILS Name: Mary [name] Age: 52 y.o. Sex: female Date of Birth: 19-Oct-1960 Admit Date: 03/15/2013 Admitting Physician Drema Dallas, MD PCP:No PCP Per Patient  Subjective: No complaints-feels better  Assessment/Plan: Principal Problem:   CAP (community acquired pneumonia) -afebrileovernight, however clinically better. 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 -TTE on 6/20-showing severe MR with ?large vegetation -blood cultures neg so far -on Vancomycin-day 2 -await TEE-if vegetation confirmed will Consult ID  Supraventricular tachycardia - Per patient, she gets episodes of palpitations very frequently at home-SVT occurred on 6/21-Successfully converted back to sinus rhythm with just 6 mg of adenosine on 6/21 - Suspect, this is not a new issue - Now stable with Toprol XL 25 mg  Episode of Wide complex tachycardia last night -Keep K above 4-therefore will replete -Mg 1.7-will keep above 2-therefore give magnesium Sulfate today  Heart murmur - from severe MR-Echo shows normal EF  Disposition: Remain inpatient  DVT Prophylaxis: Prophylactic Lovenox   Code Status: Full code   Family Communication None  Procedures:  None  CONSULTS:  cardiology   MEDICATIONS: Scheduled Meds: . aspirin  325 mg Oral Daily  . azithromycin  500 mg Oral Q24H  . cefTRIAXone (ROCEPHIN)  IV  1 g Intravenous Q24H  . enoxaparin (LOVENOX) injection  40 mg Subcutaneous Q24H  . metoprolol succinate  50 mg Oral Daily  . vancomycin  750 mg Intravenous Q12H   Continuous Infusions:   PRN Meds:.  Antibiotics: Anti-infectives   Start     Dose/Rate Route Frequency Ordered Stop   03/17/13 1700  vancomycin (VANCOCIN) IVPB 750 mg/150 ml premix     750 mg 150 mL/hr over 60 Minutes Intravenous  Every 12 hours 03/17/13 1602     03/16/13 1800  cefTRIAXone (ROCEPHIN) 1 g in dextrose 5 % 50 mL IVPB     1 g 100 mL/hr over 30 Minutes Intravenous Every 24 hours 03/15/13 2128 03/22/13 1759   03/15/13 2015  cefTRIAXone (ROCEPHIN) 1 g in dextrose 5 % 50 mL IVPB  Status:  Discontinued     1 g 100 mL/hr over 30 Minutes Intravenous Every 24 hours 03/15/13 2002 03/15/13 2128   03/15/13 2015  azithromycin (ZITHROMAX) tablet 500 mg     500 mg Oral Every 24 hours 03/15/13 2002 03/22/13 1959   03/15/13 1715  cefTRIAXone (ROCEPHIN) 1 g in dextrose 5 % 50 mL IVPB     1 g 100 mL/hr over 30 Minutes Intravenous  Once 03/15/13 1702 03/15/13 1800   03/15/13 1715  azithromycin (ZITHROMAX) tablet 500 mg     500 mg Oral  Once 03/15/13 1702 03/15/13 1728       PHYSICAL EXAM: Vital signs in last 24 hours: Filed Vitals:   03/17/13 0517 03/17/13 1340 03/17/13 2052 03/18/13 0610  BP: 116/77 106/67 118/78 112/74  Pulse: 84 92 102 82  Temp: 99.2 F (37.3 C) 97.5 F (36.4 C) 99.5 F (37.5 C) 99 F (37.2 C)  TempSrc: Oral Oral Oral Oral  Resp: 16 16 16 17   Height:      Weight:      SpO2: 98% 100% 92% 98%    Weight change:  Filed Weights   03/15/13 2022  Weight: 51.483 kg (113 lb 8 oz)   Body mass index is 20.75 kg/(m^2).   Gen Exam: Awake and alert with  clear speech.   Neck: Supple, No JVD.   Chest: B/L Clear.   CVS: S1 S2 Regular, no murmurs.  Abdomen: soft, BS +, non tender, non distended.  Extremities: no edema, lower extremities warm to touch. Neurologic: Non Focal.  Skin: No Rash.   Wounds: N/A.    Intake/Output from previous day:  Intake/Output Summary (Last 24 hours) at 03/18/13 0857 Last data filed at 03/17/13 1843  Gross per 24 hour  Intake    340 ml  Output      0 ml  Net    340 ml     LAB RESULTS: CBC  Recent Labs Lab 03/15/13 1502 03/15/13 2023 03/16/13 0500 03/17/13 0510  WBC 12.3* 10.5 8.5 7.6  HGB 11.0* 10.1* 9.8* 10.5*  HCT 30.9* 29.3* 28.1* 30.7*  PLT  382 372 331 415*  MCV 91.2 91.6 92.1 92.7  MCH 32.4 31.6 32.1 31.7  MCHC 35.6 34.5 34.9 34.2  RDW 15.2 15.3 15.5 15.4  LYMPHSABS  --   --  1.0  --   MONOABS  --   --  0.5  --   EOSABS  --   --  0.1  --   BASOSABS  --   --  0.0  --     Chemistries   Recent Labs Lab 03/15/13 1502 03/15/13 2023 03/17/13 0510  NA 137  --  133*  K 3.9  --  3.2*  CL 103  --  101  CO2 22  --  22  GLUCOSE 98  --  101*  BUN 8  --  6  CREATININE 0.69 0.60 0.67  CALCIUM 9.2  --  8.8  MG  --  1.8 1.7    CBG: No results found for this basename: GLUCAP,  in the last 168 hours  GFR Estimated Creatinine Clearance: 66.5 ml/min (by C-G formula based on Cr of 0.67).  Coagulation profile No results found for this basename: INR, PROTIME,  in the last 168 hours  Cardiac Enzymes  Recent Labs Lab 03/15/13 1728  CKMB 1.0    No components found with this basename: POCBNP,  No results found for this basename: DDIMER,  in the last 72 hours No results found for this basename: HGBA1C,  in the last 72 hours No results found for this basename: CHOL, HDL, LDLCALC, TRIG, CHOLHDL, LDLDIRECT,  in the last 72 hours No results found for this basename: TSH, T4TOTAL, FREET3, T3FREE, THYROIDAB,  in the last 72 hours No results found for this basename: VITAMINB12, FOLATE, FERRITIN, TIBC, IRON, RETICCTPCT,  in the last 72 hours No results found for this basename: LIPASE, AMYLASE,  in the last 72 hours  Urine Studies No results found for this basename: UACOL, UAPR, USPG, UPH, UTP, UGL, UKET, UBIL, UHGB, UNIT, UROB, ULEU, UEPI, UWBC, URBC, UBAC, CAST, CRYS, UCOM, BILUA,  in the last 72 hours  MICROBIOLOGY: Recent Results (from the past 240 hour(s))  CULTURE, BLOOD (ROUTINE X 2)     Status: None   Collection Time    03/15/13  8:10 PM      Result Value Range Status   Specimen Description BLOOD RIGHT ARM   Final   Special Requests BOTTLES DRAWN AEROBIC AND ANAEROBIC 10CC EACH   Final   Culture  Setup Time 03/16/2013  03:45   Final   Culture     Final   Value:        BLOOD CULTURE RECEIVED NO GROWTH TO DATE CULTURE WILL BE HELD FOR 5  DAYS BEFORE ISSUING A FINAL NEGATIVE REPORT   Report Status PENDING   Incomplete  CULTURE, BLOOD (ROUTINE X 2)     Status: None   Collection Time    03/15/13  8:20 PM      Result Value Range Status   Specimen Description BLOOD RIGHT ARM   Final   Special Requests BOTTLES DRAWN AEROBIC AND ANAEROBIC 10CC EACH   Final   Culture  Setup Time 03/16/2013 03:44   Final   Culture     Final   Value:        BLOOD CULTURE RECEIVED NO GROWTH TO DATE CULTURE WILL BE HELD FOR 5 DAYS BEFORE ISSUING A FINAL NEGATIVE REPORT   Report Status PENDING   Incomplete    RADIOLOGY STUDIES/RESULTS: Dg Chest 2 View  03/15/2013   *RADIOLOGY REPORT*  Clinical Data: Fever and cough  CHEST - 2 VIEW  Comparison: None.  Findings: There is patchy airspace opacity at the right lung base with coarse reticular opacities at both lung bases.  The lungs are otherwise clear.  No pleural effusion or pneumothorax.  Cardiac silhouette is mildly enlarged.  No mediastinal or hilar masses.  IMPRESSION: Right lung base infiltrate.  This projects in the right middle lobe with some also suspected in the right lower lobe.   Original Report Authenticated By: Amie Portland, M.D.    Jeoffrey Massed, MD  [facility] Pager:[phone]  If 7PM-7AM, please contact night-coverage [email or web address] Password [email or web address] 03/18/2013, 8:57 AM   LOS: 3 days

## 2013-03-18 NOTE — Interval H&P Note (Signed)
History and Physical Interval Note:  03/18/2013 2:23 PM  Mary [name]  has presented today for surgery, with the diagnosis of endocarditis  The various methods of treatment have been discussed with the patient and family. After consideration of risks, benefits and other options for treatment, the patient has consented to  Procedure(s): TRANSESOPHAGEAL ECHOCARDIOGRAM (TEE) (N/A) as a surgical intervention .  The patient's history has been reviewed, patient examined, no change in status, stable for surgery.  I have reviewed the patient's chart and labs.  Questions were answered to the patient's satisfaction.     Lotus Santillo [employer]

## 2013-03-18 NOTE — Progress Notes (Signed)
   CARE MANAGEMENT NOTE 03/18/2013  Patient:  AVEA, MCGOWEN A   Account Number:  [id number]  Date Initiated:  03/18/2013  Documentation initiated by:  [facility]  Subjective/Objective Assessment:   CAP     Action/Plan:   lives alone, no insurance coverage   Anticipated DC Date:  03/20/2013   Anticipated DC Plan:  HOME/SELF CARE      DC Planning Services  CM consult  MATCH Program  [facility]      Choice offered to / List presented to:             Status of service:  In process, will continue to follow Medicare Important Message given?   (If response is "NO", the following Medicare IM given date fields will be blank) Date Medicare IM given:   Date Additional Medicare IM given:    Discharge Disposition:    Per UR Regulation:    If discussed at Long Length of Stay Meetings, dates discussed:    Comments:  03/18/2013 1100 NCM spoke to pt and she does not have any type of drug or insurance coverage. Explained MATCH and that program can used once per year with a $3.00 copay. States she is only working part-time. Will arrange appt with [facility]. Isidoro Donning RN CCM Case Mgmt phone [phone]

## 2013-03-18 NOTE — Progress Notes (Signed)
  Echocardiogram Echocardiogram Transesophageal has been performed.  Mary [name] 03/18/2013, 3:04 PM

## 2013-03-18 NOTE — CV Procedure (Addendum)
Procedure: TEE  Indication: ? Mitral valve endocarditis  Sedation: Versed 4 mg IV, Fentanyl 50 mcg IV   Findings: Please see echo section for full report.  Normal LV size and systolic function, EF 60-65%.  Normal RV size and systolic function.  Mild to moderate TR with peak RV-RA gradient about 100 mmHg suggesting severe pulmonary hypertension.  Moderate LAE.  The mitral valve was thickened and the anterior leaflet had a hockey-stick appearance.  The posterior leaflet was fixed/immobile.  There was severe mitral regurgitation.  There was probably mild mitral stenosis (mean gradient across mitral valve 14 mmHg but think this was elevated due to severe MR => MVA by PHT was 1.68 cm^2).  There was a question of endocarditis.  The mitral valve was thickened in appearance on portions of both leaflets.  This was not definitively endocarditis and the valve could have been thickened due to rheumatic heart disease.  I reviewed the TTE, this actually looked more concerning for endocarditis.  It is also an abnormal mitral valve at baseline at risk for endocarditis.  I would treat for endocarditis taking both TTE and TEE into consideration.  This patient will need further attention to the diseased mitral valve.  It will most likely need replacement but PA pressure will need to be lowered.  Needs RHC/LHC and aggressive medical management.   Mary [name] 03/18/2013 3:07 PM

## 2013-03-18 NOTE — H&P (View-Only) (Signed)
 SUBJECTIVE: The patient is doing well today.  At this time, she denies chest pain, shortness of breath, or any new concerns.  . aspirin  325 mg Oral Daily  . azithromycin  500 mg Oral Q24H  . cefTRIAXone (ROCEPHIN)  IV  1 g Intravenous Q24H  . enoxaparin (LOVENOX) injection  40 mg Subcutaneous Q24H  . metoprolol succinate  25 mg Oral Daily  . vancomycin  750 mg Intravenous Q12H      OBJECTIVE: Physical Exam: Filed Vitals:   03/17/13 0517 03/17/13 1340 03/17/13 2052 03/18/13 0610  BP: 116/77 106/67 118/78 112/74  Pulse: 84 92 102 82  Temp: 99.2 F (37.3 C) 97.5 F (36.4 C) 99.5 F (37.5 C) 99 F (37.2 C)  TempSrc: Oral Oral Oral Oral  Resp: 16 16 16 17  Height:      Weight:      SpO2: 98% 100% 92% 98%    Intake/Output Summary (Last 24 hours) at 03/18/13 0821 Last data filed at 03/17/13 1843  Gross per 24 hour  Intake    340 ml  Output      0 ml  Net    340 ml    Telemetry reveals sinus rhythm,  SVT yesterday  GEN- The patient is well appearing, alert and oriented x 3 today.   Head- normocephalic, atraumatic Eyes-  Sclera clear, conjunctiva pink Ears- hearing intact Oropharynx- clear Neck- supple, no JVP Lymph- no cervical lymphadenopathy Lungs- Clear to ausculation bilaterally, normal work of breathing Heart- Regular rate and rhythm, 2/6 SEM LLSB, 2/6 holosystolic murmur at the apex GI- soft, NT, ND, + BS Extremities- no clubbing, cyanosis, or edema   LABS: Basic Metabolic Panel:  Recent Labs  03/15/13 1502 03/15/13 2023 03/17/13 0510  NA 137  --  133*  K 3.9  --  3.2*  CL 103  --  101  CO2 22  --  22  GLUCOSE 98  --  101*  BUN 8  --  6  CREATININE 0.69 0.60 0.67  CALCIUM 9.2  --  8.8  MG  --  1.8 1.7  PHOS  --  2.7  --    Liver Function Tests: No results found for this basename: AST, ALT, ALKPHOS, BILITOT, PROT, ALBUMIN,  in the last 72 hours No results found for this basename: LIPASE, AMYLASE,  in the last 72 hours CBC:  Recent Labs  03/16/13 0500 03/17/13 0510  WBC 8.5 7.6  NEUTROABS 6.9  --   HGB 9.8* 10.5*  HCT 28.1* 30.7*  MCV 92.1 92.7  PLT 331 415*   Cardiac Enzymes:  Recent Labs  03/15/13 1728  CKTOTAL 61  CKMB 1.0   BNP: No components found with this basename: POCBNP,  D-Dimer: No results found for this basename: DDIMER,  in the last 72 hours Hemoglobin A1C: No results found for this basename: HGBA1C,  in the last 72 hours Fasting Lipid Panel: No results found for this basename: CHOL, HDL, LDLCALC, TRIG, CHOLHDL, LDLDIRECT,  in the last 72 hours Thyroid Function Tests: No results found for this basename: TSH, T4TOTAL, FREET3, T3FREE, THYROIDAB,  in the last 72 hours Anemia Panel: No results found for this basename: VITAMINB12, FOLATE, FERRITIN, TIBC, IRON, RETICCTPCT,  in the last 72 hours  RADIOLOGY: Dg Chest 2 View  03/16/2013   *RADIOLOGY REPORT*  Clinical Data: Fever and cough  CHEST - 2 VIEW  Comparison: Chest radiograph 03/15/2013  Findings: Stable cardiac silhouette.  There is patchy bilateral lower lobe air   space disease not significantly changed from prior. Trace pleural effusions are again demonstrated.  No pneumothorax.  IMPRESSION: No change in  patchy bibasilar air space disease suggesting pulmonary infection.   Original Report Authenticated By: Stewart Edmunds, M.D.   Dg Chest 2 View  03/15/2013   *RADIOLOGY REPORT*  Clinical Data: Fever and cough  CHEST - 2 VIEW  Comparison: None.  Findings: There is patchy airspace opacity at the right lung base with coarse reticular opacities at both lung bases.  The lungs are otherwise clear.  No pleural effusion or pneumothorax.  Cardiac silhouette is mildly enlarged.  No mediastinal or hilar masses.  IMPRESSION: Right lung base infiltrate.  This projects in the right middle lobe with some also suspected in the right lower lobe.   Original Report Authenticated By: David Ormond, M.D.    ASSESSMENT AND PLAN:  Principal Problem:   CAP (community  acquired pneumonia) Active Problems:   Tachypnea   Leucocytosis   Undiagnosed cardiac murmurs   CHF (congestive heart failure)   SVT (supraventricular tachycardia)  1. 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  Continue on IV Vanco Given significant MR, may require surgical intervention.  Will defer to Dr Nishan after TEE today.  3. Acute diastolic CHF - improved,  EF is normal  4. Tobacco abuse cessation is advised   Goebel Hellums, MD 03/18/2013 8:21 AM  

## 2013-03-18 NOTE — Progress Notes (Signed)
SUBJECTIVE: The patient is doing well today.  At this time, she denies chest pain, shortness of breath, or any new concerns.  [name] aspirin  325 mg Oral Daily  . azithromycin  500 mg Oral Q24H  . cefTRIAXone (ROCEPHIN)  IV  1 g Intravenous Q24H  . enoxaparin (LOVENOX) injection  40 mg Subcutaneous Q24H  . metoprolol succinate  25 mg Oral Daily  . vancomycin  750 mg Intravenous Q12H      OBJECTIVE: Physical Exam: Filed Vitals:   03/17/13 0517 03/17/13 1340 03/17/13 2052 03/18/13 0610  BP: 116/77 106/67 118/78 112/74  Pulse: 84 92 102 82  Temp: 99.2 F (37.3 C) 97.5 F (36.4 C) 99.5 F (37.5 C) 99 F (37.2 C)  TempSrc: Oral Oral Oral Oral  Resp: 16 16 16 17   Height:      Weight:      SpO2: 98% 100% 92% 98%    Intake/Output Summary (Last 24 hours) at 03/18/13 1610 Last data filed at 03/17/13 1843  Gross per 24 hour  Intake    340 ml  Output      0 ml  Net    340 ml    Telemetry reveals sinus rhythm,  SVT yesterday  GEN- The patient is well appearing, alert and oriented x 3 today.   Head- normocephalic, atraumatic Eyes-  Sclera clear, conjunctiva pink Ears- hearing intact Oropharynx- clear Neck- supple, no JVP Lymph- no cervical lymphadenopathy Lungs- Clear to ausculation bilaterally, normal work of breathing Heart- Regular rate and rhythm, 2/6 SEM LLSB, 2/6 holosystolic murmur at the apex GI- soft, NT, ND, + BS Extremities- no clubbing, cyanosis, or edema   LABS: Basic Metabolic Panel:  Recent Labs  96/04/54 1502 03/15/13 2023 03/17/13 0510  NA 137  --  133*  K 3.9  --  3.2*  CL 103  --  101  CO2 22  --  22  GLUCOSE 98  --  101*  BUN 8  --  6  CREATININE 0.69 0.60 0.67  CALCIUM 9.2  --  8.8  MG  --  1.8 1.7  PHOS  --  2.7  --    Liver Function Tests: No results found for this basename: AST, ALT, ALKPHOS, BILITOT, PROT, ALBUMIN,  in the last 72 hours No results found for this basename: LIPASE, AMYLASE,  in the last 72 hours CBC:  Recent Labs  03/16/13 0500 03/17/13 0510  WBC 8.5 7.6  NEUTROABS 6.9  --   HGB 9.8* 10.5*  HCT 28.1* 30.7*  MCV 92.1 92.7  PLT 331 415*   Cardiac Enzymes:  Recent Labs  03/15/13 1728  CKTOTAL 61  CKMB 1.0   BNP: No components found with this basename: POCBNP,  D-Dimer: No results found for this basename: DDIMER,  in the last 72 hours Hemoglobin A1C: No results found for this basename: HGBA1C,  in the last 72 hours Fasting Lipid Panel: No results found for this basename: CHOL, HDL, LDLCALC, TRIG, CHOLHDL, LDLDIRECT,  in the last 72 hours Thyroid Function Tests: No results found for this basename: TSH, T4TOTAL, FREET3, T3FREE, THYROIDAB,  in the last 72 hours Anemia Panel: No results found for this basename: VITAMINB12, FOLATE, FERRITIN, TIBC, IRON, RETICCTPCT,  in the last 72 hours  RADIOLOGY: Dg Chest 2 View  03/16/2013   *RADIOLOGY REPORT*  Clinical Data: Fever and cough  CHEST - 2 VIEW  Comparison: Chest radiograph 03/15/2013  Findings: Stable cardiac silhouette.  There is patchy bilateral lower lobe air  space disease not significantly changed from prior. Trace pleural effusions are again demonstrated.  No pneumothorax.  IMPRESSION: No change in  patchy bibasilar air space disease suggesting pulmonary infection.   Original Report Authenticated By: Genevive Bi, M.D.   Dg Chest 2 View  03/15/2013   *RADIOLOGY REPORT*  Clinical Data: Fever and cough  CHEST - 2 VIEW  Comparison: None.  Findings: There is patchy airspace opacity at the right lung base with coarse reticular opacities at both lung bases.  The lungs are otherwise clear.  No pleural effusion or pneumothorax.  Cardiac silhouette is mildly enlarged.  No mediastinal or hilar masses.  IMPRESSION: Right lung base infiltrate.  This projects in the right middle lobe with some also suspected in the right lower lobe.   Original Report Authenticated By: Amie Portland, M.D.    ASSESSMENT AND PLAN:  Principal Problem:   CAP (community  acquired pneumonia) Active Problems:   Tachypnea   Leucocytosis   Undiagnosed cardiac murmurs   CHF (congestive heart failure)   SVT (supraventricular tachycardia)  1. 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  Continue on IV Vanco Given significant MR, may require surgical intervention.  Will defer to Dr Eden Emms after TEE today.  3. Acute diastolic CHF - improved,  EF is normal  4. Tobacco abuse cessation is advised   Hillis Range, MD 03/18/2013 8:21 AM

## 2013-03-19 ENCOUNTER — Encounter (HOSPITAL_COMMUNITY): Payer: Self-pay | Admitting: Cardiology

## 2013-03-19 DIAGNOSIS — R509 Fever, unspecified: Secondary | ICD-10-CM

## 2013-03-19 DIAGNOSIS — I059 Rheumatic mitral valve disease, unspecified: Secondary | ICD-10-CM

## 2013-03-19 DIAGNOSIS — R059 Cough, unspecified: Secondary | ICD-10-CM

## 2013-03-19 DIAGNOSIS — R05 Cough: Secondary | ICD-10-CM

## 2013-03-19 LAB — CBC WITH DIFFERENTIAL/PLATELET
Basophils Absolute: 0 10*3/uL (ref 0.0–0.1)
Basophils Relative: 0 % (ref 0–1)
Eosinophils Absolute: 0.5 10*3/uL (ref 0.0–0.7)
HCT: 30.5 % — ABNORMAL LOW (ref 36.0–46.0)
MCHC: 35.1 g/dL (ref 30.0–36.0)
Monocytes Absolute: 0.5 10*3/uL (ref 0.1–1.0)
Neutro Abs: 7.6 10*3/uL (ref 1.7–7.7)
Neutrophils Relative %: 78 % — ABNORMAL HIGH (ref 43–77)
RDW: 14.9 % (ref 11.5–15.5)

## 2013-03-19 LAB — BASIC METABOLIC PANEL
Chloride: 99 mEq/L (ref 96–112)
Creatinine, Ser: 0.68 mg/dL (ref 0.50–1.10)
GFR calc Af Amer: >90 mL/min (ref >90)
GFR calc non Af Amer: >90 mL/min (ref >90)

## 2013-03-19 MED ORDER — FUROSEMIDE 10 MG/ML IJ SOLN
INTRAMUSCULAR | Status: AC
  Filled 2013-03-19: qty 4

## 2013-03-19 MED ORDER — POTASSIUM CHLORIDE CRYS ER 20 MEQ PO TBCR
40.0000 meq | EXTENDED_RELEASE_TABLET | Freq: Once | ORAL | Status: AC
  Administered 2013-03-19: 40 meq via ORAL
  Filled 2013-03-19: qty 2

## 2013-03-19 MED ORDER — DEXTROSE 5 % IV SOLN
2.0000 g | Freq: Two times a day (BID) | INTRAVENOUS | Status: DC
  Administered 2013-03-19 – 2013-03-26 (×14): 2 g via INTRAVENOUS
  Filled 2013-03-19 (×15): qty 2

## 2013-03-19 MED ORDER — ASPIRIN 81 MG PO CHEW
CHEWABLE_TABLET | ORAL | Status: AC
  Administered 2013-03-19: 81 mg
  Filled 2013-03-19: qty 1

## 2013-03-19 MED ORDER — ASPIRIN 81 MG PO CHEW
81.0000 mg | CHEWABLE_TABLET | Freq: Every day | ORAL | Status: DC
  Administered 2013-03-20 – 2013-03-26 (×7): 81 mg via ORAL
  Filled 2013-03-19 (×7): qty 1

## 2013-03-19 MED ORDER — FUROSEMIDE 10 MG/ML IJ SOLN
40.0000 mg | Freq: Two times a day (BID) | INTRAMUSCULAR | Status: DC
  Administered 2013-03-19 (×2): 40 mg via INTRAVENOUS
  Filled 2013-03-19 (×3): qty 4

## 2013-03-19 MED ORDER — DEXTROSE 5 % IV SOLN: 2.0000 g | Freq: Two times a day (BID) | INTRAVENOUS | Status: DC

## 2013-03-19 NOTE — Consult Note (Signed)
[facility]  Total days of antibiotics 5        Day 5 ceftriaxone        Day 3 vanco        Day 5 azithromycin       Reason for Consult: possible subacute endocarditis    Referring Physician: ghimire  Principal Problem:   CAP (community acquired pneumonia) Active Problems:   Tachypnea   Leucocytosis   Undiagnosed cardiac murmurs   CHF (congestive heart failure)   SVT (supraventricular tachycardia)    HPI: Mary [name] is a 52 y.o. female Mary [name] is a 52 yo woman with  hx of born with a heart murmur, GERD, smoker who presents to the ED on 6/20 with a 2-3 day history of fevers to 102F, SOB on exertion and dry cough.She noticed to have some nasal congestion x 3 days but most noteably had DOE and  Bilateral LE swelling up to her knees x 1 day. She has new onset 4 pillow orthopnea. 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 She denies chest pain with exertion, dizziness, syncope. In the ED, she is found to have temp of 100.4, lab shows WBC elevated at 12.3 a chest x-ray revealed a RML/RLL infiltrate and thus she was admitted for community acquired pneumonia txd with ctx, azithromycin. Her ECG revealed sinus tachycardia with BAE along with an elevated BNP and bilateral edema leading to cardiology consultation for potential heart failure symptoms.  On 6/22 TTE showed the anterior mitral valve leaflet appears myxomatous with MVP and moderate to severe MR.There is a large vegetation on the anterior MV leaflet which is mobile and possibly a vegetatin on the posterior leaflet.  Moderate to severe regurgitation. She subsequently underwent TEE  On 6/23 which showed Normal LV size and systolic function, EF 60-65%. Mild to moderate TR with peak RV-RA gradient about 100 mmHg suggesting severe pulmonary hypertension. Moderate LAE.  The mitral valve was thickened and the anterior leaflet had a hockey-stick appearance. The posterior leaflet was fixed/immobile.  severe mitral regurgitation. The mitral valve was thickened in appearance on portions of both leaflets which is concerning for SBE. Her infectious work-up thus far has included 2 sets of blood cultures on admit which are NGTD.  ID consulted for work up and management of native valve endocarditis.  She denies any dental work, tooth pain but did notice some increase gum bleeding at end of may beginning of June. No sick contacts. No travel. No pet/cat scratch. She states that she was increasingly short of breath last night needed to be sitting up but felt better with sup O2.  She reports her mother is flying down from [place] to help her.  Past Medical History  Diagnosis Date  . Heart murmur   . CAP (community acquired pneumonia) 03/15/2013    "maybe; that's what they are thinking today" (03/15/2013)  . Asthma     "as a baby"   . Orthopnoea 03/15/2013    "in the last 2 days" (03/15/2013)  . Anemia   . History of blood transfusion     "14 w/1st pregnancy; 2 w/last C-section" (03/15/2013)  . GERD (gastroesophageal reflux disease)     Allergies: No Known Allergies  MEDICATIONS: . aspirin  81 mg Oral Daily  . azithromycin  500  mg Oral Q24H  . cefTRIAXone (ROCEPHIN)  IV  1 g Intravenous Q24H  . enoxaparin (LOVENOX) injection  40 mg Subcutaneous Q24H  . furosemide  40 mg Intravenous BID  . metoprolol succinate  50 mg Oral Daily  . potassium chloride  40 mEq Oral Once  . vancomycin  750 mg Intravenous Q12H    History  Substance Use Topics  . Smoking status: Current Every Day Smoker -- 0.50 packs/day for 36 years    Types: Cigarettes  . Smokeless tobacco: Never Used  . Alcohol Use: 3.6 oz/week    6 Glasses of wine per week     Comment: 03/15/2013 "bottle of wine/wk"    History reviewed. No pertinent family history.   Review of Systems  Constitutional: positive  for fever, chills, diaphoresis, fatigue, activity change, but no change in appetite change,  and unexpected weight change.  HENT: Negative for congestion, sore throat, rhinorrhea, sneezing, trouble swallowing and sinus pressure.  Eyes: Negative for photophobia and visual disturbance.  Respiratory: positive for cough, chest tightness, shortness of breath, but nowheezing and stridor.  Cardiovascular: positive for leg swelling. Negative for chest pain, palpitations Gastrointestinal: Negative for nausea, vomiting, abdominal pain, diarrhea, constipation, blood in stool, abdominal distention and anal bleeding.  Genitourinary: Negative for dysuria, hematuria, flank pain and difficulty urinating.  Musculoskeletal: Negative for myalgias, back pain, joint swelling, arthralgias and gait problem.  Skin: Negative for color change, pallor, rash and wound.  Neurological: Negative for dizziness, tremors, weakness and light-headedness.  Hematological: Negative for adenopathy. Does not bruise/bleed easily.  Psychiatric/Behavioral: Negative for behavioral problems, confusion, sleep disturbance, dysphoric mood, decreased concentration and agitation.     OBJECTIVE: Temp:  [98.3 F (36.8 C)-98.9 F (37.2 C)] 98.9 F (37.2 C) (06/24 0516) Pulse Rate:  [74-88] 85 (06/24 1003) Resp:  [16-47] 18 (06/24 0516) BP: (126-147)/(61-93) 131/87 mmHg (06/24 1003) SpO2:  [93 %-100 %] 96 % (06/24 0516) GEN- The patient is well appearing, alert and oriented x 3 today.  Head- normocephalic, atraumatic  Eyes- Sclera clear, conjunctiva pink  Ears- hearing intact  Oropharynx- clear , good dentition Neck- supple, no JVP  Lymph- no cervical lymphadenopathy  Lungs- Clear to ausculation bilaterally, normal work of breathing  Heart- Regular rate and rhythm, 2/6 SEM LLSB, 2/6 holosystolic murmur at the apex  GI- soft, NT, ND, + BS  Extremities- no clubbing, cyanosis, or edema, no splinter hemorrhage  LABS: CBC    Component  Value Date/Time   WBC 7.6 03/17/2013 0510   RBC 3.31* 03/17/2013 0510   HGB 10.5* 03/17/2013 0510   HCT 30.7* 03/17/2013 0510   PLT 415* 03/17/2013 0510   MCV 92.7 03/17/2013 0510   MCH 31.7 03/17/2013 0510   MCHC 34.2 03/17/2013 0510   RDW 15.4 03/17/2013 0510   LYMPHSABS 1.0 03/16/2013 0500   MONOABS 0.5 03/16/2013 0500   EOSABS 0.1 03/16/2013 0500   BASOSABS 0.0 03/16/2013 0500    BMET    Component Value Date/Time   NA 133* 03/17/2013 0510   K 3.2* 03/17/2013 0510   CL 101 03/17/2013 0510   CO2 22 03/17/2013 0510   GLUCOSE 101* 03/17/2013 0510   BUN 6 03/17/2013 0510   CREATININE 0.67 03/17/2013 0510   CALCIUM 8.8 03/17/2013 0510   GFRNONAA >90 03/17/2013 0510   GFRAA >90 03/17/2013 0510    MICRO: 6/20 blood culture NGTD  IMAGING: No results found.  HISTORICAL MICRO/IMAGING  Assessment/Plan: 52yo F who presents with fever, dry cough, DOE, LE swelling  c/w CHF found to severe MR with  echo confirming MV leaflet abnormality concerning for acute/subacute bacterial endocarditis. Currently has 2 major valvular and 2 minor Duke's criteria. Pathogen likely due to coagulase negative staph species or nutrient deficient strep.currently being treated for CAP due to RML infiltrate  - continue with empiric treatment for endocarditis with vancomycin and ceftriaxone 2gm IV Q12   - while on vancomycin, recommend daily bmp -  Repeat 2 sets of blood cultures today  - recommend xray imaging of teeth to see if any possible abscess/dental caries to suggest nutrient deficient strep as possible cause of endocarditis  - once she goes to surgery, would send valve for 16s DNA testing as well as culture  -- if repeat blood cutlures are negative, will consider doing culture negative serologies such as check bartonella but doesn't appear to have risk factors  For CAP: finish this would be last day of azithromycin,   Reakwon Barren B. Drue Second MD MPH [facility] [phone]

## 2013-03-19 NOTE — Progress Notes (Addendum)
Patient ID: Mary [name], female   DOB: May 22, 1961, 52 y.o.   MRN: [id number]    SUBJECTIVE: Short of breath last night, hard to sleep until she put on oxygen.  She is very orthopneic.  No fever/chills.   [name] aspirin  81 mg Oral Daily  . azithromycin  500 mg Oral Q24H  . cefTRIAXone (ROCEPHIN)  IV  1 g Intravenous Q24H  . enoxaparin (LOVENOX) injection  40 mg Subcutaneous Q24H  . furosemide  40 mg Intravenous BID  . metoprolol succinate  50 mg Oral Daily  . vancomycin  750 mg Intravenous Q12H      Filed Vitals:   03/18/13 1520 03/18/13 1530 03/18/13 2119 03/19/13 0516  BP: 132/61 126/84 129/86 132/79  Pulse:  74 78 87  Temp:  98.6 F (37 C) 98.6 F (37 C) 98.9 F (37.2 C)  TempSrc:  Oral Oral Oral  Resp: 28 18 18 18   Height:      Weight:      SpO2: 93% 98% 96% 96%    Intake/Output Summary (Last 24 hours) at 03/19/13 0831 Last data filed at 03/18/13 1512  Gross per 24 hour  Intake    500 ml  Output      0 ml  Net    500 ml    LABS: Basic Metabolic Panel:  Recent Labs  40/98/11 0510  NA 133*  K 3.2*  CL 101  CO2 22  GLUCOSE 101*  BUN 6  CREATININE 0.67  CALCIUM 8.8  MG 1.7   Liver Function Tests: No results found for this basename: AST, ALT, ALKPHOS, BILITOT, PROT, ALBUMIN,  in the last 72 hours No results found for this basename: LIPASE, AMYLASE,  in the last 72 hours CBC:  Recent Labs  03/17/13 0510  WBC 7.6  HGB 10.5*  HCT 30.7*  MCV 92.7  PLT 415*   Cardiac Enzymes: No results found for this basename: CKTOTAL, CKMB, CKMBINDEX, TROPONINI,  in the last 72 hours BNP: No components found with this basename: POCBNP,  D-Dimer: No results found for this basename: DDIMER,  in the last 72 hours Hemoglobin A1C: No results found for this basename: HGBA1C,  in the last 72 hours Fasting Lipid Panel: No results found for this basename: CHOL, HDL, LDLCALC, TRIG, CHOLHDL, LDLDIRECT,  in the last 72 hours Thyroid Function Tests: No results found for  this basename: TSH, T4TOTAL, FREET3, T3FREE, THYROIDAB,  in the last 72 hours Anemia Panel: No results found for this basename: VITAMINB12, FOLATE, FERRITIN, TIBC, IRON, RETICCTPCT,  in the last 72 hours  RADIOLOGY: Dg Chest 2 View  03/16/2013   *RADIOLOGY REPORT*  Clinical Data: Fever and cough  CHEST - 2 VIEW  Comparison: Chest radiograph 03/15/2013  Findings: Stable cardiac silhouette.  There is patchy bilateral lower lobe air space disease not significantly changed from prior. Trace pleural effusions are again demonstrated.  No pneumothorax.  IMPRESSION: No change in  patchy bibasilar air space disease suggesting pulmonary infection.   Original Report Authenticated By: Genevive Bi, M.D.   Dg Chest 2 View  03/15/2013   *RADIOLOGY REPORT*  Clinical Data: Fever and cough  CHEST - 2 VIEW  Comparison: None.  Findings: There is patchy airspace opacity at the right lung base with coarse reticular opacities at both lung bases.  The lungs are otherwise clear.  No pleural effusion or pneumothorax.  Cardiac silhouette is mildly enlarged.  No mediastinal or hilar masses.  IMPRESSION: Right lung base infiltrate.  This  projects in the right middle lobe with some also suspected in the right lower lobe.   Original Report Authenticated By: Amie Portland, M.D.    PHYSICAL EXAM General: NAD Neck: JVP 14 cm, no thyromegaly or thyroid nodule.  Lungs: Rhonchi bilaterally.  CV: Nondisplaced PMI.  Heart regular S1/S2, no S3/S4, 3/6 HSM apex.  No peripheral edema.  No carotid bruit.  Normal pedal pulses.  Abdomen: Soft, nontender, no hepatosplenomegaly, no distention.  Neurologic: Alert and oriented x 3.  Psych: Normal affect. Extremities: No clubbing or cyanosis.   ASSESSMENT AND PLAN: 52 yo with history of heart murmur presented with dyspnea and was found to have PNA by CXR.  Loud heart murmur was heard and BNP was elevated, so echo was obtained.  This showed severe MR and possible MV vegetation.  TEE  yesterday confirmed mitral valve disease.  1. 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  There is mild to moderate mitral stenosis (mean gradient very elevated but suspect this is due to high flow with severe MR => valve area in mild MS range by PHT).  She says she has had a heart murmur since childhood.  Suspect she had rheumatic fever at some point.  She had not been dyspneic until recently => concern for endocarditis involving a valve that was abnormal at baseline with worsening of MR.  I looked at the TTE and the TEE => the TTE actually was more suggestive of endocarditis.  The TEE showed a thickened MV but could be from rheumatic heart disease.  The TTE in the apical 2 chamber view was very concerning for a vegetation.  She has CHF likely from MR.  I suspect that she is going to need the valve replaced.  The high PA pressure noted by TEE is going to be a barrier for surgery.  This will need to be confirmed by RHC and hopefully will begin to improve with diuresis.  Will involve CVTS today.  She cannot lie flat currently due to orthopnea so will diurese today and plan cath possibly tomorrow.  2. ID: Suspected PNA with fever at admission and infiltrate on CXR.  Currently afebrile.  Blood cultures so far negative.  Concern for endocarditis based on TTE/TEE and would treat as such. She is on vanco/azithro/ceftriaxone.  Will consult ID today.  3. Acute on chronic diastolic CHF: EF normal by echo with severe MR/probably mild MS.  She is volume overloaded on exam with dyspnea and orthopnea.  PA pressure was very high be TEE. This will need to be lowered prior to surgery.  - Start Lasix 40 mg IV bid.   - Will keep NPO overnight, possible RHC/LHC tomorrow if she is able to lie more flat.  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   Marca Ancona 03/19/2013 8:41 AM

## 2013-03-19 NOTE — Progress Notes (Signed)
[facility] PROGRESS NOTE  Mary [name] Mary [name] DOB: November 17, 1960 DOA: 03/15/2013 PCP: No PCP Per Patient  Assessment/Plan: CAP (community acquired pneumonia)  - Afebrile, clinically better. 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 She is volume overloaded on exam with dyspnea and orthopnea.  - Start Lasix 40 mg IV bid.   Supraventricular tachycardia  - Per patient, she gets episodes of palpitations very frequently at home-SVT occurred on 6/21-Successfully converted back to sinus rhythm with just 6 mg of adenosine on 6/21  - Suspect, this is not a new issue  - No repeat episodes in last 24 hours - Now stable with Toprol XL 25 mg   Episode of Wide complex tachycardia last night  -Keep K above 4-therefore will replete  -Mg 1.7-will keep above 2-therefore give magnesium Sulfate today   Heart murmur  - from severe MR-Echo shows normal EF - Continue with plan above    DVT Prophylaxis:  Prophylactic Lovenox    Code Status: Full - None on file Family Communication: None at bedside. Mother is traveling from [place] today  Disposition Plan: Inpatient   Consultants:  Cardiology - Dr. Shirlee Latch  ID  Procedures: Procedure: TEE  Indication: ? Mitral valve endocarditis  Sedation: Versed 4 mg IV, Fentanyl 50 mcg IV  Findings: Please see echo section for full  report. Normal LV size and systolic function, EF 60-65%. Normal RV size and systolic function. Mild to moderate TR with peak RV-RA gradient about 100 mmHg suggesting severe pulmonary hypertension. Moderate LAE. The mitral valve was thickened and the anterior leaflet had a hockey-stick appearance. The posterior leaflet was fixed/immobile. There was severe mitral regurgitation. There was probably mild mitral stenosis (mean gradient across mitral valve 14 mmHg but think this was elevated due to severe MR => MVA by PHT was 1.68 cm^2). There was a question of endocarditis. The mitral valve was thickened in appearance on portions of both leaflets. This was not definitively endocarditis and the valve could have been thickened due to rheumatic heart disease. I reviewed the TTE, this actually looked more concerning for endocarditis. It is also an abnormal mitral valve at baseline at risk for endocarditis. I would treat for endocarditis taking both TTE and TEE into consideration. This patient will need further attention to the diseased mitral valve. It will most likely need replacement but PA pressure will need to be lowered. Needs RHC/LHC and aggressive medical management.   Marca Ancona  03/18/2013   Antibiotics:  Azithromax  500 mg po - 6/20 >>   Ceftroaxone 1 g IV - 6/20 >>  Vancomycin 750 mg/150 ml IV - 6/22 >>  HPI/Subjective: Mary [name] with a history of heart murmur is complaining of dyspnea. She states her SOB was worse last night but after starting on O2 Mary [name] it is improved. She states that she had a  HA last night which also improved with O2. She notes orthopnea for the past week. She has been anxious about her recent diagnosis and pts mother is traveling from [place] today. She denies chest pain, N/V/D, constipation, fever/chills, difficulty urinating.   Objective: Filed Vitals:   03/18/13 1520 03/18/13 1530 03/18/13 2119 03/19/13 0516  BP: 132/61 126/84 129/86 132/79  Pulse:  74 78 87  Temp:  98.6 F  (37 C) 98.6 F (37 C) 98.9 F (37.2 C)  TempSrc:  Oral Oral Oral  Resp: 28 18 18 18   Height:      Weight:      SpO2: 93% 98% 96% 96%    Intake/Output Summary (Last 24 hours) at 03/19/13 1001 Last data filed at 03/18/13 1512  Gross per 24 hour  Intake    500 ml  Output      0 ml  Net    500 ml   Filed Weights   03/15/13 2022  Weight: Mary.483 kg (113 lb 8 oz)    Exam:    General:  A & Ox3, NAD, resting comfortably in bed with  in place  Cardiovascular: RRR, 3/6 HSM apex (suggestive of mitral regurgitation)  Respiratory: CTA bilaterally  Abdomen: Soft, NT, ND, BS+  Musculoskeletal: No peripheral edema   Data Reviewed: Basic Metabolic Panel:  Recent Labs Lab 03/15/13 1502 03/15/13 2023 03/17/13 0510  NA 137  --  133*  K 3.9  --  3.2*  CL 103  --  101  CO2 22  --  22  GLUCOSE 98  --  101*  BUN 8  --  6  CREATININE 0.69 0.60 0.67  CALCIUM 9.2  --  8.8  MG  --  1.8 1.7  PHOS  --  2.7  --    CBC:  Recent Labs Lab 03/15/13 1502 03/15/13 2023 03/16/13 0500 03/17/13 0510  WBC 12.3* 10.5 8.5 7.6  NEUTROABS  --   --  6.9  --   HGB 11.0* 10.1* 9.8* 10.5*  HCT 30.9* 29.3* 28.1* 30.7*  MCV 91.2 91.6 92.1 92.7  PLT 382 372 331 415*   Cardiac Enzymes:  Recent Labs Lab 03/15/13 1728  CKTOTAL 61  CKMB 1.0   BNP (last 3 results)  Recent Labs  03/15/13 1502 03/15/13 2023  PROBNP 4891.0* 4536.0*    Recent Results (from the past 240 hour(s))  CULTURE, BLOOD (ROUTINE X 2)     Status: None   Collection Time    03/15/13  8:10 PM      Result Value Range Status   Specimen Description BLOOD RIGHT ARM   Final   Special Requests BOTTLES DRAWN AEROBIC AND ANAEROBIC 10CC EACH   Final   Culture  Setup Time 03/16/2013 03:45   Final   Culture     Final   Value:        BLOOD CULTURE RECEIVED NO GROWTH TO DATE CULTURE WILL BE HELD FOR 5 DAYS BEFORE ISSUING A FINAL NEGATIVE REPORT   Report Status PENDING   Incomplete  CULTURE, BLOOD (ROUTINE X 2)     Status:  None   Collection Time    03/15/13  8:20 PM      Result Value Range Status   Specimen Description BLOOD RIGHT ARM   Final   Special Requests BOTTLES DRAWN AEROBIC AND ANAEROBIC 10CC EACH   Final   Culture  Setup Time 03/16/2013 03:44   Final   Culture     Final   Value:  BLOOD CULTURE RECEIVED NO GROWTH TO DATE CULTURE WILL BE HELD FOR 5 DAYS BEFORE ISSUING A FINAL NEGATIVE REPORT   Report Status PENDING   Incomplete     Studies: No results found.  Scheduled Meds: . aspirin  81 mg Oral Daily  . azithromycin  500 mg Oral Q24H  . cefTRIAXone (ROCEPHIN)  IV  1 g Intravenous Q24H  . enoxaparin (LOVENOX) injection  40 mg Subcutaneous Q24H  . furosemide  40 mg Intravenous BID  . metoprolol succinate  50 mg Oral Daily  . vancomycin  750 mg Intravenous Q12H   Continuous Infusions: . sodium chloride      Principal Problem:   CAP (community acquired pneumonia) Active Problems:   Tachypnea   Leucocytosis   Undiagnosed cardiac murmurs   CHF (congestive heart failure)   SVT (supraventricular tachycardia)    Ralene Muskrat PA-S [facility]  03/19/2013, 10:01 AM  LOS: 4 days     Attending Patient seen and examined, agree with the assessment and plan as outlined above. Suspect SOB from acute CHF, starting Diuretics.Have asked ID to see, RHC and LHC tomorrow.  S Erabella Kuipers

## 2013-03-20 ENCOUNTER — Inpatient Hospital Stay (HOSPITAL_COMMUNITY): Payer: Medicaid Other

## 2013-03-20 DIAGNOSIS — I509 Heart failure, unspecified: Secondary | ICD-10-CM

## 2013-03-20 DIAGNOSIS — R0682 Tachypnea, not elsewhere classified: Secondary | ICD-10-CM

## 2013-03-20 LAB — BASIC METABOLIC PANEL
BUN: 4 mg/dL — ABNORMAL LOW (ref 6–23)
Creatinine, Ser: 0.72 mg/dL (ref 0.50–1.10)
GFR calc Af Amer: >90 mL/min (ref >90)
GFR calc non Af Amer: >90 mL/min (ref >90)

## 2013-03-20 LAB — VANCOMYCIN, TROUGH: Vancomycin Tr: 30.3 ug/mL (ref 10.0–20.0)

## 2013-03-20 MED ORDER — FLUCONAZOLE 150 MG PO TABS
150.0000 mg | ORAL_TABLET | Freq: Once | ORAL | Status: AC
  Administered 2013-03-20: 150 mg via ORAL
  Filled 2013-03-20: qty 1

## 2013-03-20 MED ORDER — FUROSEMIDE 10 MG/ML IJ SOLN
80.0000 mg | Freq: Two times a day (BID) | INTRAMUSCULAR | Status: DC
  Administered 2013-03-20 – 2013-03-21 (×4): 80 mg via INTRAVENOUS
  Filled 2013-03-20 (×7): qty 8

## 2013-03-20 NOTE — Progress Notes (Signed)
SUBJECTIVE: Pt still dyspeic this am. Could not stay in supine position for more than 3 minutes, then became dyspneic. No pain.   BP 105/67  Pulse 83  Temp(Src) 99.3 F (37.4 C) (Oral)  Resp 18  Ht 5\' 2"  (1.575 m)  Wt 113 lb 14.4 oz (51.665 kg)  BMI 20.83 kg/m2  SpO2 94%  LMP 02/19/2013  Intake/Output Summary (Last 24 hours) at 03/20/13 1610 Last data filed at 03/20/13 0548  Gross per 24 hour  Intake    200 ml  Output      0 ml  Net    200 ml    PHYSICAL EXAM General: Well developed, well nourished, in no acute distress. Alert and oriented x 3.  Psych:  Good affect, responds appropriately Neck: + JVD. No masses noted.  Lungs: Clear bilaterally with no wheezes or rhonci noted.  Heart: RRR with loud systolic murmur Abdomen: Bowel sounds are present. Soft, non-tender.  Extremities: No lower extremity edema.   LABS: Basic Metabolic Panel:  Recent Labs  96/04/54 1812 03/20/13 0530  NA 132* 135  K 4.5 4.3  CL 99 102  CO2 23 25  GLUCOSE 100* 83  BUN 4* 4*  CREATININE 0.68 0.72  CALCIUM 9.6 9.1   CBC:  Recent Labs  03/19/13 1812  WBC 9.7  NEUTROABS 7.6  HGB 10.7*  HCT 30.5*  MCV 91.3  PLT 502*   Current Meds: . aspirin  81 mg Oral Daily  . azithromycin  500 mg Oral Q24H  . cefTRIAXone (ROCEPHIN)  IV  2 g Intravenous Q12H  . enoxaparin (LOVENOX) injection  40 mg Subcutaneous Q24H  . furosemide  40 mg Intravenous BID  . metoprolol succinate  50 mg Oral Daily  . vancomycin  750 mg Intravenous Q12H     ASSESSMENT AND PLAN: 52 yo with history of heart murmur presented with dyspnea and was found to have PNA by CXR. Loud heart murmur was heard and BNP was elevated, so echo was obtained. This showed severe MR and possible MV vegetation. TEE confirmed mitral valve disease. She is being followed by Dr. Shirlee Latch.   1. 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 There is mild to moderate  mitral stenosis (mean gradient very elevated but suspect this is due to high flow with severe MR => valve area in mild MS range by PHT). She says she has had a heart murmur since childhood. Probable rheumatic fever at some point. She had not been dyspneic until recently => concern for endocarditis involving a valve that was abnormal at baseline with worsening of MR. TTE suggestive of endocarditis. The TEE showed a thickened MV but could be from rheumatic heart disease. The TTE in the apical 2 chamber view was very concerning for a vegetation. She has CHF likely from MR. Will likely need valve replacement. The high PA pressure noted by TEE could be a barrier to surgery. This will need to be confirmed by RHC and hopefully will begin to improve with diuresis. CVTS will need to be involved. She is not able to tolerate being flat today. Continue to be orthopneic. Will not plan cardiac cath today. Will reassess in am. Will increase Lasix today.     2. ID: Suspected PNA with fever at admission and infiltrate on CXR. Currently afebrile. Blood cultures so far negative. Concern for endocarditis based on TTE/TEE and would treat as such. She is on vanco/azithro/ceftriaxone. ID consult team on  board.   3. Acute on chronic diastolic CHF: EF normal by echo with severe MR/probably mild MS. She is volume overloaded on exam with dyspnea and continued orthopnea. PA pressure was very high be TEE. This will need to be lowered prior to surgery. Will change Lasix to 80 mg IV BID.    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     MCALHANY,CHRISTOPHER  6/25/20148:07 AM

## 2013-03-20 NOTE — Progress Notes (Signed)
[facility] PROGRESS NOTE  Mary [name] ZOX:[id number] DOB: 03-05-1961 DOA: 03/15/2013 PCP: No PCP Per Patient  HPI/Subjective: 52 yo AAF with complaints of dyspnea and orthopnea today. She is still sleeping on numerous pillows with the bed elevated and is unable to lay flat for more than 5 minutes. She is not using O2 [place] at this time and denies dizziness. She notes vaginal itching which started last night. She has no other complaints today. She denies chest pain, N/V/D, abdominal pain, dysuria.   Assessment/Plan: CAP (community acquired pneumonia)  - Afebrile, clinically better. 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 Drue Second - order in process - continue Vancomycin and Rocephin  Acute on chronic diastolic CHF  - EF normal by echo with severe MR/probably mild MS. She is volume overloaded on exam with dyspnea and orthopnea.  - Continue Lasix 40 mg IV bid.   Supraventricular tachycardia  - Per patient, she gets episodes of palpitations very frequently at home-SVT occurred on 6/21-Successfully converted back to sinus rhythm with just 6 mg of adenosine on 6/21  - Suspect, this is not a new issue  - No repeat episodes in last 24 hours  - Now stable with Toprol XL 25 mg   Episode of Wide complex tachycardia -Keep K above 4- stable today  -stable  Heart murmur  - from severe MR-Echo shows normal EF  - Continue with plan above   Vulvovaginal Candidiasis - likely  secondary to antibiotic use - will give Diflucan today  DVT Prophylaxis:  Prophylactic Lovenox    Code Status: Full, none on file Family Communication: None at bedside  Disposition Plan: Inpatient    Consultants:  Cardiology  ID  Procedures:  None  Antibiotics: Azithromax 500 mg po - 6/20 >>  Ceftroaxone 1 g IV - 6/20 >>  Vancomycin 750 mg/150 ml IV - 6/22 >>    Objective: Filed Vitals:   03/19/13 1003 03/19/13 1300 03/19/13 2058 03/20/13 0513  BP: 131/87 122/81 122/81 105/67  Pulse: 85 94 82 83  Temp:  98.9 F (37.2 C) 98.4 F (36.9 C) 99.3 F (37.4 C)  TempSrc:  Oral Oral Oral  Resp:  20 18 18   Height:      Weight:    51.665 kg (113 lb 14.4 oz)  SpO2:  100% 100% 94%    Intake/Output Summary (Last 24 hours) at 03/20/13 1148 Last data filed at 03/20/13 0548  Gross per 24 hour  Intake    200 ml  Output      0 ml  Net    200 ml   Filed Weights   03/15/13 2022 03/20/13 0513  Weight: 51.483 kg (113 lb 8 oz) 51.665 kg (113 lb 14.4 oz)    Exam:   General:  A & Ox3, NAD, walking in room without difficulty  Cardiovascular: RRR, 3/6 HSM at apex   Respiratory: CTA bilaterally, no wheezes or rhonchi  Abdomen: BS+, soft, NT, ND, no masses  Musculoskeletal: slow gait, no assistance needed. No peripheral edema   Data Reviewed: Basic Metabolic Panel:  Recent Labs Lab 03/15/13 1502 03/15/13 2023 03/17/13 0510 03/19/13 1812 03/20/13 0530  NA 137  --  133* 132* 135  K 3.9  --  3.2* 4.5 4.3  CL 103  --  101 99 102  CO2 22  --  22 23 25   GLUCOSE 98  --  101* 100* 83  BUN 8  --  6 4* 4*  CREATININE 0.69 0.60 0.67 0.68 0.72  CALCIUM 9.2  --  8.8 9.6 9.1  MG  --  1.8 1.7  --   --   PHOS  --  2.7  --   --   --    CBC:  Recent Labs Lab 03/15/13 1502 03/15/13 2023 03/16/13 0500 03/17/13 0510 03/19/13 1812  WBC 12.3* 10.5 8.5 7.6 9.7  NEUTROABS  --   --  6.9  --  7.6  HGB 11.0* 10.1* 9.8* 10.5* 10.7*  HCT 30.9* 29.3* 28.1* 30.7* 30.5*  MCV  91.2 91.6 92.1 92.7 91.3  PLT 382 372 331 415* 502*   Cardiac Enzymes:  Recent Labs Lab 03/15/13 1728  CKTOTAL 61  CKMB 1.0   BNP (last 3 results)  Recent Labs  03/15/13 1502 03/15/13 2023  PROBNP 4891.0* 4536.0*     Recent Results (from the past 240 hour(s))  CULTURE, BLOOD (ROUTINE X 2)     Status: None   Collection Time    03/15/13  8:10 PM      Result Value Range Status   Specimen Description BLOOD RIGHT ARM   Final   Special Requests BOTTLES DRAWN AEROBIC AND ANAEROBIC 10CC EACH   Final   Culture  Setup Time 03/16/2013 03:45   Final   Culture     Final   Value:        BLOOD CULTURE RECEIVED NO GROWTH TO DATE CULTURE WILL BE HELD FOR 5 DAYS BEFORE ISSUING A FINAL NEGATIVE REPORT   Report Status PENDING   Incomplete  CULTURE, BLOOD (ROUTINE X 2)     Status: None   Collection Time    03/15/13  8:20 PM      Result Value Range Status   Specimen Description BLOOD RIGHT ARM   Final   Special Requests BOTTLES DRAWN AEROBIC AND ANAEROBIC 10CC EACH   Final   Culture  Setup Time 03/16/2013 03:44   Final   Culture     Final   Value:        BLOOD CULTURE RECEIVED NO GROWTH TO DATE CULTURE WILL BE HELD FOR 5 DAYS BEFORE ISSUING A FINAL NEGATIVE REPORT   Report Status PENDING   Incomplete     Studies: No results found.  Scheduled Meds: . aspirin  81 mg Oral Daily  . cefTRIAXone (ROCEPHIN)  IV  2 g Intravenous Q12H  . enoxaparin (LOVENOX) injection  40 mg Subcutaneous Q24H  . furosemide  80 mg Intravenous BID  . metoprolol succinate  50 mg Oral Daily  . vancomycin  750 mg Intravenous Q12H   Continuous Infusions: . sodium chloride      Principal Problem:   CAP (community acquired pneumonia) Active Problems:   Tachypnea   Leucocytosis   Undiagnosed cardiac murmurs   CHF (congestive heart failure)   SVT (supraventricular tachycardia)    Ralene Muskrat PA-S [facility]  03/20/2013, 11:48 AM  LOS: 5 days    Addendum  Patient seen and examined, chart  and data base reviewed.  I agree with the above assessment and plan.  For full details please see Mrs. Algis Downs PA note.   Clint Lipps, MD [facility] Pager: [phone] 03/20/2013, 12:51 PM

## 2013-03-21 ENCOUNTER — Inpatient Hospital Stay (HOSPITAL_COMMUNITY): Payer: Medicaid Other

## 2013-03-21 ENCOUNTER — Other Ambulatory Visit: Payer: Self-pay | Admitting: *Deleted

## 2013-03-21 DIAGNOSIS — I330 Acute and subacute infective endocarditis: Secondary | ICD-10-CM | POA: Diagnosis present

## 2013-03-21 DIAGNOSIS — I059 Rheumatic mitral valve disease, unspecified: Secondary | ICD-10-CM

## 2013-03-21 LAB — CBC
HCT: 27.9 % — ABNORMAL LOW (ref 36.0–46.0)
MCHC: 35.1 g/dL (ref 30.0–36.0)
Platelets: 508 10*3/uL — ABNORMAL HIGH (ref 150–400)
RDW: 14.8 % (ref 11.5–15.5)
WBC: 7.6 10*3/uL (ref 4.0–10.5)

## 2013-03-21 LAB — BASIC METABOLIC PANEL
Calcium: 9.1 mg/dL (ref 8.4–10.5)
Chloride: 100 mEq/L (ref 96–112)
Creatinine, Ser: 0.74 mg/dL (ref 0.50–1.10)
GFR calc Af Amer: >90 mL/min (ref >90)
GFR calc non Af Amer: >90 mL/min (ref >90)

## 2013-03-21 LAB — PULMONARY FUNCTION TEST

## 2013-03-21 LAB — VANCOMYCIN, TROUGH: Vancomycin Tr: 13.4 ug/mL (ref 10.0–20.0)

## 2013-03-21 MED ORDER — POTASSIUM CHLORIDE CRYS ER 20 MEQ PO TBCR
40.0000 meq | EXTENDED_RELEASE_TABLET | Freq: Four times a day (QID) | ORAL | Status: AC
  Administered 2013-03-21 (×2): 40 meq via ORAL
  Filled 2013-03-21 (×2): qty 2

## 2013-03-21 MED ORDER — SODIUM CHLORIDE 0.9 % IV SOLN: 250.0000 mL | INTRAVENOUS | Status: DC | PRN

## 2013-03-21 MED ORDER — MAGNESIUM SULFATE 40 MG/ML IJ SOLN
2.0000 g | Freq: Once | INTRAMUSCULAR | Status: AC
  Administered 2013-03-21: 2 g via INTRAVENOUS
  Filled 2013-03-21: qty 50

## 2013-03-21 MED ORDER — SODIUM CHLORIDE 0.9 % IJ SOLN
3.0000 mL | Freq: Two times a day (BID) | INTRAMUSCULAR | Status: DC
  Administered 2013-03-21: 3 mL via INTRAVENOUS

## 2013-03-21 MED ORDER — ALBUTEROL SULFATE (5 MG/ML) 0.5% IN NEBU
2.5000 mg | INHALATION_SOLUTION | Freq: Once | RESPIRATORY_TRACT | Status: AC
  Administered 2013-03-21: 2.5 mg via RESPIRATORY_TRACT

## 2013-03-21 MED ORDER — SODIUM CHLORIDE 0.9 % IV SOLN
1.0000 mL/kg/h | INTRAVENOUS | Status: DC
  Administered 2013-03-22: 1 mL/kg/h via INTRAVENOUS

## 2013-03-21 MED ORDER — SODIUM CHLORIDE 0.9 % IJ SOLN: 3.0000 mL | INTRAMUSCULAR | Status: DC | PRN

## 2013-03-21 NOTE — Progress Notes (Signed)
[facility] PROGRESS NOTE  Mary [name] ZOX:[id number] DOB: [birth year] DOA: 03/15/2013 PCP: No PCP Per Patient  HPI/Subjective: 52 yo AAF feeling much better today. She states she only slept on 2 pillows last night and the bed was less elevated. She did not need to use O2 [place] last night or this AM. She states vaginal itching is much improved. She has no complaints today. She denies, chest pain, N/V/D, abdominal pain.  Assessment/Plan: CAP (community acquired pneumonia)  - Afebrile, clinically better. 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 Drue Second - NGTD, final results pending - continue Vancomycin and Rocephin   Acute on chronic diastolic CHF  - EF normal by echo with severe MR/probably mild MS. She is volume overloaded on exam with dyspnea and orthopnea.  - Continue Lasix 40 mg IV bid.   Hypokalemia - Serum K - 3.4 (6/26) - Will replace and monitor BMP  Supraventricular tachycardia  - Per patient, she gets episodes of palpitations very frequently at home-SVT occurred on 6/21-Successfully converted back to sinus rhythm with just 6 mg of adenosine on 6/21  - Suspect, this is not a new issue  - No repeat episodes in last 24 hours  - Now stable with Toprol XL 25 mg   Episode of Wide complex tachycardia  -Keep K above 4- stable today  -stable   Heart murmur  - from severe MR-Echo shows normal EF  - Continue with plan above   Vulvovaginal  Candidiasis  - likely secondary to antibiotic use  - will give Diflucan 6/25 - resolved  DVT Prophylaxis:  Prophylactic Lovenox    Code Status: Full Family Communication: None at bedside Disposition Plan: Inpatient   Consultants:  Cardiology  Procedures:  None  Antibiotics:  Azithromax 500 mg po - 6/20 >> 6/24  Ceftroaxone 1 g IV - 6/20 >>   Vancomycin 750 mg/150 ml IV - 6/22 >>     Objective: Filed Vitals:   03/20/13 1750 03/21/13 0300 03/21/13 0500 03/21/13 0818  BP: 117/70  100/66 106/60  Pulse: 76  79   Temp:   99 F (37.2 C)   TempSrc:   Oral   Resp:   20   Height:      Weight:  47.446 kg (104 lb 9.6 oz)    SpO2:   96%     Intake/Output Summary (Last 24 hours) at 03/21/13 1021 Last data filed at 03/21/13 0500  Gross per 24 hour  Intake    716 ml  Output   3100 ml  Net  -2384 ml   Filed Weights   03/15/13 2022 03/20/13 0513 03/21/13 0300  Weight: 51.483 kg (113 lb 8 oz) 51.665 kg (113 lb 14.4 oz) 47.446 kg (104 lb 9.6 oz)    Exam:   General:  A & Ox3, NAD, sitting in chair  Cardiovascular: RRR, 3/6 HSM at apex, no g/r  Respiratory: CTA bilaterally, no tachypnea  Abdomen: BS+,  soft, NT, ND, no masses/organomegaly  Musculoskeletal: no peripheral edema   Data Reviewed: Basic Metabolic Panel:  Recent Labs Lab 03/15/13 1502 03/15/13 2023 03/17/13 0510 03/19/13 1812 03/20/13 0530 03/21/13 0545  NA 137  --  133* 132* 135 135  K 3.9  --  3.2* 4.5 4.3 3.4*  CL 103  --  101 99 102 100  CO2 22  --  22 23 25 26   GLUCOSE 98  --  101* 100* 83 91  BUN 8  --  6 4* 4* 7  CREATININE 0.69 0.60 0.67 0.68 0.72 0.74  CALCIUM 9.2  --  8.8 9.6 9.1 9.1  MG  --  1.8 1.7  --   --   --   PHOS  --  2.7  --   --   --   --    CBC:  Recent Labs Lab 03/15/13 2023 03/16/13 0500 03/17/13 0510 03/19/13 1812 03/21/13 0545  WBC 10.5 8.5 7.6 9.7 7.6  NEUTROABS  --  6.9  --  7.6  --   HGB 10.1* 9.8* 10.5* 10.7* 9.8*  HCT 29.3* 28.1* 30.7* 30.5*  27.9*  MCV 91.6 92.1 92.7 91.3 89.7  PLT 372 331 415* 502* 508*   Cardiac Enzymes:  Recent Labs Lab 03/15/13 1728  CKTOTAL 61  CKMB 1.0   BNP (last 3 results)  Recent Labs  03/15/13 1502 03/15/13 2023  PROBNP 4891.0* 4536.0*    Recent Results (from the past 240 hour(s))  CULTURE, BLOOD (ROUTINE X 2)     Status: None   Collection Time    03/15/13  8:10 PM      Result Value Range Status   Specimen Description BLOOD RIGHT ARM   Final   Special Requests BOTTLES DRAWN AEROBIC AND ANAEROBIC 10CC EACH   Final   Culture  Setup Time 03/16/2013 03:45   Final   Culture     Final   Value:        BLOOD CULTURE RECEIVED NO GROWTH TO DATE CULTURE WILL BE HELD FOR 5 DAYS BEFORE ISSUING A FINAL NEGATIVE REPORT   Report Status PENDING   Incomplete  CULTURE, BLOOD (ROUTINE X 2)     Status: None   Collection Time    03/15/13  8:20 PM      Result Value Range Status   Specimen Description BLOOD RIGHT ARM   Final   Special Requests BOTTLES DRAWN AEROBIC AND ANAEROBIC 10CC EACH   Final   Culture  Setup Time 03/16/2013 03:44   Final   Culture     Final   Value:        BLOOD CULTURE RECEIVED NO GROWTH TO DATE CULTURE WILL BE HELD FOR 5 DAYS BEFORE ISSUING A FINAL NEGATIVE REPORT   Report Status PENDING   Incomplete  CULTURE, BLOOD (ROUTINE X 2)     Status: None   Collection Time    03/19/13  4:23 PM      Result Value Range Status   Specimen Description BLOOD LEFT ARM   Final   Special Requests BOTTLES DRAWN AEROBIC AND ANAEROBIC 10CC   Final   Culture  Setup Time 03/20/2013 04:07   Final   Culture     Final   Value:        BLOOD CULTURE RECEIVED NO GROWTH TO DATE CULTURE WILL BE HELD FOR 5 DAYS BEFORE ISSUING A FINAL NEGATIVE REPORT   Report Status PENDING   Incomplete  CULTURE, BLOOD (ROUTINE X 2)  Status: None   Collection Time    03/19/13  4:24 PM      Result Value Range Status   Specimen Description BLOOD LEFT ARM   Final   Special Requests BOTTLES DRAWN AEROBIC AND ANAEROBIC  10CC   Final   Culture  Setup Time 03/20/2013 04:07   Final   Culture     Final   Value:        BLOOD CULTURE RECEIVED NO GROWTH TO DATE CULTURE WILL BE HELD FOR 5 DAYS BEFORE ISSUING A FINAL NEGATIVE REPORT   Report Status PENDING   Incomplete     Studies: Dg Orthopantogram  03/20/2013   *RADIOLOGY REPORT*  Clinical Data: 52 year old female with endocarditis.  Query source.  ORTHOPANTOGRAM/PANORAMIC  Comparison: None.  Findings: Mandible intact. Bone mineralization is within normal limits.  Prominent dental caries left maxillary anterior molar. Questionable dental caries right medial maxillary incisor. Maxillary sinuses appear normally pneumatized.  IMPRESSION: 1.  Prominent dental caries left maxillary anterior molar (tooth #14). 2.  Possible dental caries right medial maxillary incisor (tooth #8).   Original Report Authenticated By: Erskine Speed, M.D.    Scheduled Meds: . aspirin  81 mg Oral Daily  . cefTRIAXone (ROCEPHIN)  IV  2 g Intravenous Q12H  . enoxaparin (LOVENOX) injection  40 mg Subcutaneous Q24H  . furosemide  80 mg Intravenous BID  . metoprolol succinate  50 mg Oral Daily  . vancomycin  750 mg Intravenous Q12H   Continuous Infusions: . sodium chloride      Principal Problem:   CAP (community acquired pneumonia) Active Problems:   Tachypnea   Leucocytosis   Undiagnosed cardiac murmurs   CHF (congestive heart failure)   SVT (supraventricular tachycardia)    Time spent: 35 minutes    Ralene Muskrat PA-S [facility]  03/21/2013, 10:21 AM  LOS: 6 days     Addendum  Patient seen and examined, chart and data base reviewed.  I agree with the above assessment and plan.  For full details please see Mrs. Algis Downs PA note.   Clint Lipps, MD [facility] Pager: [phone] 03/21/2013, 3:49 PM

## 2013-03-21 NOTE — Progress Notes (Signed)
    SUBJECTIVE:   SOB is improved.  No chest pain.  No further SVT.  BP 106/60  Pulse 79  Temp(Src) 99 F (37.2 C) (Oral)  Resp 20  Ht 5\' 2"  (1.575 m)  Wt 104 lb 9.6 oz (47.446 kg)  BMI 19.13 kg/m2  SpO2 96%  LMP 02/19/2013  Intake/Output Summary (Last 24 hours) at 03/21/13 0829 Last data filed at 03/21/13 0500  Gross per 24 hour  Intake    716 ml  Output   3100 ml  Net  -2384 ml    PHYSICAL EXAM General: Well developed, well nourished, in no acute distress. Alert and oriented x 3.  Psych:  Good affect, responds appropriately Neck: + JVD. No masses noted.  Lungs: Clear bilaterally with no wheezes or rhonci noted.  Heart: RRR with loud systolic murmur Abdomen: Bowel sounds are present. Soft, non-tender.  Extremities: No lower extremity edema.   LABS: Basic Metabolic Panel:  Recent Labs  16/10/96 0530 03/21/13 0545  NA 135 135  K 4.3 3.4*  CL 102 100  CO2 25 26  GLUCOSE 83 91  BUN 4* 7  CREATININE 0.72 0.74  CALCIUM 9.1 9.1   CBC:  Recent Labs  03/19/13 1812 03/21/13 0545  WBC 9.7 7.6  NEUTROABS 7.6  --   HGB 10.7* 9.8*  HCT 30.5* 27.9*  MCV 91.3 89.7  PLT 502* 508*   Current Meds: . aspirin  81 mg Oral Daily  . cefTRIAXone (ROCEPHIN)  IV  2 g Intravenous Q12H  . enoxaparin (LOVENOX) injection  40 mg Subcutaneous Q24H  . furosemide  80 mg Intravenous BID  . metoprolol succinate  50 mg Oral Daily  . vancomycin  750 mg Intravenous Q12H     ASSESSMENT AND PLAN: 52 yo with history of heart murmur presented with dyspnea and was found to have PNA by CXR.  Severe MR and possible MV vegetation by TEE.  1. 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 There is mild to moderate mitral stenosis (mean gradient very elevated but suspect this is due to high flow with severe MR => valve area in mild MS range by PHT). She says she has had a heart murmur since childhood. Probable rheumatic  fever at some point. She had not been dyspneic until recently => concern for endocarditis involving a valve that was abnormal at baseline with worsening of MR. TTE suggestive of endocarditis. The TEE showed a thickened MV but could be from rheumatic heart disease.   Will likely need valve replacement. The high PA pressure noted by TEE could be a barrier to surgery. This will need to be confirmed by RHC and hopefully will begin to improve with diuresis. CVTS will need to be involved.  I will ask for them to see her today. Continue IV diuresis for another day and proceed with RHC/LHC tomorrow.  2. ID:  She is on vanco/azithro/ceftriaxone. ID consult team on board.   3. Acute on chronic diastolic CHF: EF normal by echo with severe MR/probably mild MS. She is volume overloaded on exam with dyspnea and continued orthopnea.  Continue  Lasix to 80 mg IV BID.    4. SVT:  Controlled on metoprolol.     Hillis Range, MD

## 2013-03-21 NOTE — Progress Notes (Signed)
ANTIBIOTIC CONSULT NOTE - FOLLOW UP  Pharmacy Consult for vancomycin Indication: pneumonia and endocarditis  Labs:  Recent Labs  03/19/13 1812 03/20/13 0530 03/21/13 0545  WBC 9.7  --  7.6  HGB 10.7*  --  9.8*  PLT 502*  --  508*  CREATININE 0.68 0.72 0.74   Estimated Creatinine Clearance: 63 ml/min (by C-G formula based on Cr of 0.74).  Recent Labs  03/20/13 1658 03/21/13 0545  VANCOTROUGH 30.3* 13.4     Microbiology: Recent Results (from the past 720 hour(s))  CULTURE, BLOOD (ROUTINE X 2)     Status: None   Collection Time    03/15/13  8:10 PM      Result Value Range Status   Specimen Description BLOOD RIGHT ARM   Final   Special Requests BOTTLES DRAWN AEROBIC AND ANAEROBIC 10CC EACH   Final   Culture  Setup Time 03/16/2013 03:45   Final   Culture     Final   Value:        BLOOD CULTURE RECEIVED NO GROWTH TO DATE CULTURE WILL BE HELD FOR 5 DAYS BEFORE ISSUING A FINAL NEGATIVE REPORT   Report Status PENDING   Incomplete  CULTURE, BLOOD (ROUTINE X 2)     Status: None   Collection Time    03/15/13  8:20 PM      Result Value Range Status   Specimen Description BLOOD RIGHT ARM   Final   Special Requests BOTTLES DRAWN AEROBIC AND ANAEROBIC 10CC EACH   Final   Culture  Setup Time 03/16/2013 03:44   Final   Culture     Final   Value:        BLOOD CULTURE RECEIVED NO GROWTH TO DATE CULTURE WILL BE HELD FOR 5 DAYS BEFORE ISSUING A FINAL NEGATIVE REPORT   Report Status PENDING   Incomplete    Anti-infectives   Start     Dose/Rate Route Frequency Ordered Stop   03/20/13 1400  fluconazole (DIFLUCAN) tablet 150 mg     150 mg Oral  Once 03/20/13 1254 03/20/13 1402   03/20/13 1000  cefTRIAXone (ROCEPHIN) 2 g in dextrose 5 % 50 mL IVPB  Status:  Discontinued     2 g 100 mL/hr over 30 Minutes Intravenous Every 12 hours 03/19/13 1722 03/19/13 1725   03/19/13 1725  cefTRIAXone (ROCEPHIN) 2 g in dextrose 5 % 50 mL IVPB     2 g 100 mL/hr over 30 Minutes Intravenous Every 12  hours 03/19/13 1725     03/17/13 1700  vancomycin (VANCOCIN) IVPB 750 mg/150 ml premix     750 mg 150 mL/hr over 60 Minutes Intravenous Every 12 hours 03/17/13 1602     03/16/13 1800  cefTRIAXone (ROCEPHIN) 1 g in dextrose 5 % 50 mL IVPB  Status:  Discontinued     1 g 100 mL/hr over 30 Minutes Intravenous Every 24 hours 03/15/13 2128 03/19/13 1722   03/15/13 2015  cefTRIAXone (ROCEPHIN) 1 g in dextrose 5 % 50 mL IVPB  Status:  Discontinued     1 g 100 mL/hr over 30 Minutes Intravenous Every 24 hours 03/15/13 2002 03/15/13 2128   03/15/13 2015  azithromycin (ZITHROMAX) tablet 500 mg  Status:  Discontinued     500 mg Oral Every 24 hours 03/15/13 2002 03/20/13 1023   03/15/13 1715  cefTRIAXone (ROCEPHIN) 1 g in dextrose 5 % 50 mL IVPB     1 g 100 mL/hr over 30 Minutes Intravenous  Once 03/15/13  1702 03/15/13 1800   03/15/13 1715  azithromycin (ZITHROMAX) tablet 500 mg     500 mg Oral  Once 03/15/13 1702 03/15/13 1728      Assessment/Plan:  52yo female therapeutic on vancomycin for endocarditis and CAP (result 13.4 but lab drawn 1.5hr late so calculates to ~16).  Will continue current dose and continue to monitor.  Vernard Gambles, PharmD, BCPS  03/21/2013,7:14 AM

## 2013-03-21 NOTE — Consult Note (Signed)
[address]       [name] [address]             [phone]        Reason for Consult: Severe Mitral Regurgitation, possible endocarditis. Referring Physician:  Dr. Hillis Range  Mary [name] is an 52 y.o. female.  HPI:   The patient is a 52 year old black female who reports being in her usual state of health until a few days prior to admission. On Tuesday she noted swelling in her feet and lower legs which she has never had before. She said that she laid down with her legs elevated that night and the swelling went down in her legs but by the morning she developed significant shortness of breath as well as cough, fever, and chills. This worsened over the next few days and by Friday she decided to come to the hospital. Her proBNP was 4891. A chest x-ray showed patchy airspace opacity of the right lung base extending into the right middle lobe. Her white blood cell count was12.3 and her temperature was 99.2. She was admitted on 03/15/2013 with a diagnosis of community acquired pneumonia. Cardiology was consulted due to a murmur and signs of congestive heart failure. 2-D echocardiogram showed left ventricular ejection fraction of 60-65%. The mitral valve leaflets appeared thickened with moderate to severe mitral regurgitation. There was a question of a large vegetation on the anterior mitral valve leaflet and possibly the posterior leaflet. There is a question of whether she had endocarditis although blood cultures have been negative and she has remained afebrile with a normal white blood cell count. A TEE showed a rheumatic-appearing mitral valve with markedly thickened leaflets and a fixed/immobile posterior leaflet. There is severe mitral regurgitation with probable mild mitral stenosis. There is mild to moderate tricuspid regurgitation with a peak RV-RA gradient of about 100 suggesting severe pulmonary hypertension. There were no definite vegetations seen. Her congestive  heart failure symptoms have improved with diuresis. She has been treated for suspected endocarditis and has been seen by infectious disease. She said that last night she was able to lie down in bed although not completely flat. She denies any history of infections and said that she last saw her dentist about 4 years ago.  Past Medical History  Diagnosis Date  . Heart murmur   . CAP (community acquired pneumonia) 03/15/2013    "maybe; that's what they are thinking today" (03/15/2013)  . Asthma     "as a baby"   . Orthopnoea 03/15/2013    "in the last 2 days" (03/15/2013)  . Anemia   . History of blood transfusion     "14 w/1st pregnancy; 2 w/last C-section" (03/15/2013)  . GERD (gastroesophageal reflux disease)     Past Surgical History  Procedure Laterality Date  . Appendectomy  ~1978  . Cesarean section  1983; 1999  . Cholecystectomy  2000's  . Tubal ligation  1999  . Tee without cardioversion N/A 03/18/2013    Procedure: TRANSESOPHAGEAL ECHOCARDIOGRAM (TEE);  Surgeon: Laurey Morale, MD;  Location: [facility] ENDOSCOPY;  Service: Cardiovascular;  Laterality: N/A;    History reviewed. No pertinent family history.  Social History:  reports that she has been smoking Cigarettes.  She has a 18 pack-year smoking history. She has never used smokeless tobacco. She reports that she drinks about 3.6 ounces of alcohol per week. She reports that she does not use illicit drugs.  Allergies: No Known Allergies  Medications:  I have reviewed the patient's current medications. Prior to Admission:  No prescriptions prior to admission   Scheduled: . aspirin  81 mg Oral Daily  . cefTRIAXone (ROCEPHIN)  IV  2 g Intravenous Q12H  . enoxaparin (LOVENOX) injection  40 mg Subcutaneous Q24H  . furosemide  80 mg Intravenous BID  . metoprolol succinate  50 mg Oral Daily  . vancomycin  750 mg Intravenous Q12H   Continuous: . sodium chloride     [id number]  Results for orders placed during the  hospital encounter of 03/15/13 (from the past 48 hour(s))  CULTURE, BLOOD (ROUTINE X 2)     Status: None   Collection Time    03/19/13  4:23 PM      Result Value Range   Specimen Description BLOOD LEFT ARM     Special Requests BOTTLES DRAWN AEROBIC AND ANAEROBIC 10CC     Culture  Setup Time 03/20/2013 04:07     Culture       Value:        BLOOD CULTURE RECEIVED NO GROWTH TO DATE CULTURE WILL BE HELD FOR 5 DAYS BEFORE ISSUING A FINAL NEGATIVE REPORT   Report Status PENDING    CULTURE, BLOOD (ROUTINE X 2)     Status: None   Collection Time    03/19/13  4:24 PM      Result Value Range   Specimen Description BLOOD LEFT ARM     Special Requests BOTTLES DRAWN AEROBIC AND ANAEROBIC 10CC     Culture  Setup Time 03/20/2013 04:07     Culture       Value:        BLOOD CULTURE RECEIVED NO GROWTH TO DATE CULTURE WILL BE HELD FOR 5 DAYS BEFORE ISSUING A FINAL NEGATIVE REPORT   Report Status PENDING    CBC WITH DIFFERENTIAL     Status: Abnormal   Collection Time    03/19/13  6:12 PM      Result Value Range   WBC 9.7  4.0 - 10.5 K/uL   RBC 3.34 (*) 3.87 - 5.11 MIL/uL   Hemoglobin 10.7 (*) 12.0 - 15.0 g/dL   HCT 41.3 (*) 24.4 - 01.0 %   MCV 91.3  78.0 - 100.0 fL   MCH 32.0  26.0 - 34.0 pg   MCHC 35.1  30.0 - 36.0 g/dL   RDW 27.2  53.6 - 64.4 %   Platelets 502 (*) 150 - 400 K/uL   Neutrophils Relative % 78 (*) 43 - 77 %   Neutro Abs 7.6  1.7 - 7.7 K/uL   Lymphocytes Relative 11 (*) 12 - 46 %   Lymphs Abs 1.0  0.7 - 4.0 K/uL   Monocytes Relative 5  3 - 12 %   Monocytes Absolute 0.5  0.1 - 1.0 K/uL   Eosinophils Relative 6 (*) 0 - 5 %   Eosinophils Absolute 0.5  0.0 - 0.7 K/uL   Basophils Relative 0  0 - 1 %   Basophils Absolute 0.0  0.0 - 0.1 K/uL  BASIC METABOLIC PANEL     Status: Abnormal   Collection Time    03/19/13  6:12 PM      Result Value Range   Sodium 132 (*) 135 - 145 mEq/L   Potassium 4.5  3.5 - 5.1 mEq/L   Chloride 99  96 - 112 mEq/L   CO2 23  19 - 32 mEq/L   Glucose,  Bld 100 (*) 70 - 99 mg/dL  BUN 4 (*) 6 - 23 mg/dL   Creatinine, Ser 1.61  0.50 - 1.10 mg/dL   Calcium 9.6  8.4 - 09.6 mg/dL   GFR calc non Af Amer >90  >90 mL/min   GFR calc Af Amer >90  >90 mL/min   Comment:            The eGFR has been calculated     using the CKD EPI equation.     This calculation has not been     validated in all clinical     situations.     eGFR's persistently     <90 mL/min signify     possible Chronic Kidney Disease.  BASIC METABOLIC PANEL     Status: Abnormal   Collection Time    03/20/13  5:30 AM      Result Value Range   Sodium 135  135 - 145 mEq/L   Potassium 4.3  3.5 - 5.1 mEq/L   Chloride 102  96 - 112 mEq/L   CO2 25  19 - 32 mEq/L   Glucose, Bld 83  70 - 99 mg/dL   BUN 4 (*) 6 - 23 mg/dL   Creatinine, Ser 0.45  0.50 - 1.10 mg/dL   Calcium 9.1  8.4 - 40.9 mg/dL   GFR calc non Af Amer >90  >90 mL/min   GFR calc Af Amer >90  >90 mL/min   Comment:            The eGFR has been calculated     using the CKD EPI equation.     This calculation has not been     validated in all clinical     situations.     eGFR's persistently     <90 mL/min signify     possible Chronic Kidney Disease.  VANCOMYCIN, TROUGH     Status: Abnormal   Collection Time    03/20/13  4:58 PM      Result Value Range   Vancomycin Tr 30.3 (*) 10.0 - 20.0 ug/mL   Comment: CRITICAL RESULT CALLED TO, READ BACK BY AND VERIFIED WITH:     L JOHNSON,RN 1803 03/20/13 D BRADLEY  BASIC METABOLIC PANEL     Status: Abnormal   Collection Time    03/21/13  5:45 AM      Result Value Range   Sodium 135  135 - 145 mEq/L   Potassium 3.4 (*) 3.5 - 5.1 mEq/L   Comment: DELTA CHECK NOTED   Chloride 100  96 - 112 mEq/L   CO2 26  19 - 32 mEq/L   Glucose, Bld 91  70 - 99 mg/dL   BUN 7  6 - 23 mg/dL   Creatinine, Ser 8.11  0.50 - 1.10 mg/dL   Calcium 9.1  8.4 - 91.4 mg/dL   GFR calc non Af Amer >90  >90 mL/min   GFR calc Af Amer >90  >90 mL/min   Comment:            The eGFR has been  calculated     using the CKD EPI equation.     This calculation has not been     validated in all clinical     situations.     eGFR's persistently     <90 mL/min signify     possible Chronic Kidney Disease.  CBC     Status: Abnormal   Collection Time    03/21/13  5:45 AM  Result Value Range   WBC 7.6  4.0 - 10.5 K/uL   RBC 3.11 (*) 3.87 - 5.11 MIL/uL   Hemoglobin 9.8 (*) 12.0 - 15.0 g/dL   HCT 86.5 (*) 78.4 - 69.6 %   MCV 89.7  78.0 - 100.0 fL   MCH 31.5  26.0 - 34.0 pg   MCHC 35.1  30.0 - 36.0 g/dL   RDW 29.5  28.4 - 13.2 %   Platelets 508 (*) 150 - 400 K/uL  VANCOMYCIN, TROUGH     Status: None   Collection Time    03/21/13  5:45 AM      Result Value Range   Vancomycin Tr 13.4  10.0 - 20.0 ug/mL    Dg Orthopantogram  03/20/2013   *RADIOLOGY REPORT*  Clinical Data: 52 year old female with endocarditis.  Query source.  ORTHOPANTOGRAM/PANORAMIC  Comparison: None.  Findings: Mandible intact. Bone mineralization is within normal limits.  Prominent dental caries left maxillary anterior molar. Questionable dental caries right medial maxillary incisor. Maxillary sinuses appear normally pneumatized.  IMPRESSION: 1.  Prominent dental caries left maxillary anterior molar (tooth #14). 2.  Possible dental caries right medial maxillary incisor (tooth #8).   Original Report Authenticated By: Erskine Speed, M.D.    Review of Systems  Constitutional: Positive for fever, chills and malaise/fatigue. Negative for weight loss and diaphoresis.  HENT: Negative.   Eyes: Negative.   Respiratory: Positive for cough and shortness of breath. Negative for hemoptysis and sputum production.   Cardiovascular: Positive for orthopnea, leg swelling and PND. Negative for chest pain and palpitations.  Gastrointestinal: Negative.   Genitourinary:       Vaginal itching that she thinks is due to yeast infection.  Musculoskeletal: Negative.   Skin: Negative.   Neurological: Negative.  Negative for weakness.    Endo/Heme/Allergies: Negative.   Psychiatric/Behavioral: Negative.    Blood pressure 106/60, pulse 79, temperature 99 F (37.2 C), temperature source Oral, resp. rate 20, height 5\' 2"  (1.575 m), weight 47.446 kg (104 lb 9.6 oz), last menstrual period 02/19/2013, SpO2 96.00%. Physical Exam  Constitutional: She is oriented to person, place, and time. She appears well-developed and well-nourished. No distress.  HENT:  Head: Normocephalic and atraumatic.  Mouth/Throat: Oropharynx is clear and moist.  Teeth in fair condition  Eyes: Conjunctivae and EOM are normal. Pupils are equal, round, and reactive to light.  Neck: Normal range of motion. Neck supple. No JVD present. No thyromegaly present.  Cardiovascular: Normal rate, regular rhythm and intact distal pulses.   Murmur heard. 2-3/6 systolic murmur at apex radiating to axilla.  Respiratory: Effort normal and breath sounds normal. No respiratory distress. She has no wheezes. She has no rales.  GI: Soft. Bowel sounds are normal. She exhibits no distension and no mass. There is no tenderness.  Musculoskeletal: She exhibits no edema and no tenderness.  Lymphadenopathy:    She has no cervical adenopathy.  Neurological: She is alert and oriented to person, place, and time. She has normal strength. No cranial nerve deficit or sensory deficit.  Skin: Skin is warm and dry.  Psychiatric: She has a normal mood and affect.   [facility]*                *[facility]*                      [address]  [place], [place] [address]                         [phone]   ------------------------------------------------------------ Transthoracic Echocardiography  (Report amended )  Patient:    Mary [name], Mary [name] MR #:       [id number] Study Date: 03/17/2013 Gender:     F Age:        51 Height:     157.5cm Weight:     51.3kg BSA:        1.87m^2 Pt. Status: Room:       5511    PERFORMING   [facility],  Ec  SONOGRAPHER  Dewitt Hoes, RDCS  ATTENDING    Ghimire, Shanker  ORDERING     Tresa Endo, Prentice Docker  ADMITTING    Carolyne Littles cc:  ------------------------------------------------------------ LV EF: 60% -   65%  ------------------------------------------------------------ Indications:      Dyspnea 786.09.  ------------------------------------------------------------ History:   PMH:   Murmur.  Congestive heart failure.  Risk factors:  Tachypnea. Leukocytosis. Current tobacco use.  ------------------------------------------------------------ Study Conclusions  - Left ventricle: The cavity size was normal. Systolic   function was normal. The estimated ejection fraction was   in the range of 60% to 65%. Wall motion was normal; there   were no regional wall motion abnormalities. Left   ventricular diastolic function parameters were normal. 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   Recommend TEE for better evaluation. Mild thickening of   the anterior leaflet, consistent with myxomatous   proliferation. Prolapse. Moderate to severe regurgitation.   Valve area by pressure half-time: 2.22cm^2. - Left atrium: The atrium was moderately dilated. - Pericardium, extracardiac: A small, free-flowing   pericardial effusion was identified circumferential to the   heart. The fluid had no internal echoes.There was no   evidence of hemodynamic compromise. Transthoracic echocardiography.  M-mode, complete 2D, spectral Doppler, and color Doppler.  Height:  Height: 157.5cm. Height: 62in.  Weight:  Weight: 51.3kg. Weight: 112.8lb.  Body mass index:  BMI: 20.7kg/m^2.  Body surface area:    BSA: 1.82m^2.  Blood pressure:     116/77.  Patient status:  Inpatient.  Location:   Bedside.  ------------------------------------------------------------  ------------------------------------------------------------ Left ventricle:  The cavity size was normal. Systolic function was normal. The estimated ejection fraction was in the range of 60% to 65%. Wall motion was normal; there were no regional wall motion abnormalities. The transmitral flow pattern was normal. The deceleration time of the early transmitral flow velocity was normal. The pulmonary vein flow pattern was normal. The tissue Doppler parameters were normal. Left ventricular diastolic function parameters were normal.  ------------------------------------------------------------ Aortic valve:   Mildly thickened, mildly calcified leaflets. Cusp separation was normal.  Doppler:  Transvalvular velocity was within the normal range. There was no stenosis.  No regurgitation.  ------------------------------------------------------------ Mitral valve:  The anterior mitral valve leaflet appears myxomatous with MVP and moderate to severe MR. In some views the anterior leaflet suggest possible vegetation but this may just be signfiant myxomatous changes. Recommend TEE for better evaluation.  Mild thickening of the anterior leaflet, consistent with myxomatous proliferation.  Prolapse. Doppler:   Moderate to severe regurgitation.    Valve area by pressure half-time: 2.22cm^2. Indexed  valve area by pressure half-time: 1.48cm^2/m^2.    Mean gradient: 19mm Hg (D). Peak gradient: 38mm Hg (D).  ------------------------------------------------------------ Left atrium:  The atrium was moderately dilated.  ------------------------------------------------------------ Right ventricle:  The cavity size was normal. Wall thickness was normal. Systolic function was normal.  ------------------------------------------------------------ Pulmonic valve:    Structurally normal valve.   Cusp separation was normal.  Doppler:   Transvalvular velocity was within the normal range.  Mild regurgitation.  ------------------------------------------------------------ Tricuspid valve:   Structurally normal valve.   Leaflet separation was normal.  Doppler:  Transvalvular velocity was within the normal range.  Mild regurgitation.  ------------------------------------------------------------ Right atrium:  The atrium was normal in size.  ------------------------------------------------------------ Pericardium:  A small, free-flowing pericardial effusion was identified circumferential to the heart. The fluid had no internal echoes.There was no evidence of hemodynamic compromise.  ------------------------------------------------------------ Systemic veins: Inferior vena cava: The vessel was dilated; the respirophasic diameter changes were blunted (< 50%); findings are consistent with elevated central venous pressure.  ------------------------------------------------------------  2D measurements        Normal  Doppler measurements   Normal Left ventricle                 Mitral valve LVID ED,   31.3 mm     43-52   Peak E vel  249 cm/s   ------ chord,                         Peak A vel  200 cm/s   ------ PLAX                           Mean vel,   206 cm/s   ------ LVID ES,     17 mm     23-38   D chord,                         Decelerati  377 ms     150-23 PLAX                           on time                0 FS, chord,   46 %      >29     Pressure     99 ms     ------ PLAX                           half-time LVPW, ED   12.5 mm     ------  Mean         19 mm Hg  ------ IVS/LVPW   0.87        <1.3    gradient, ratio, ED                      D Ventricular septum             Peak         38 mm Hg  ------ IVS, ED    10.9 mm     ------  gradient, Aorta                          D Root diam,   22 mm     ------  Peak  E/A    1.2        ------ ED                             ratio Left atrium                    Area  (PHT) 2.22 cm^2   ------ AP dim       52 mm     ------  Area index 1.48 cm^2/m ------ AP dim     3.47 cm/m^2 <2.2    (PHT)           ^2 index                          Annulus      77 cm     ------ Vol, S       93 ml     ------  VTI Vol index,   62 ml/m^2 ------  Max regurg  527 cm/s   ------ S                              vel                                Regurg VTI  144 cm     ------                                Systemic veins                                Estimated    10 mm Hg  ------                                CVP                                Right ventricle                                Sa vel,    13.7 cm/s   ------                                lat ann,                                tiss DP   ------------------------------------------------------------ Geralynn Rile, MD [id number]   Procedure: TEE  Indication: ? Mitral valve endocarditis  Sedation: Versed 4 mg IV, Fentanyl 50 mcg IV   Findings: Please see echo section for full report.  Normal LV size and systolic function, EF 60-65%.  Normal RV size and systolic function.  Mild to moderate TR with peak RV-RA gradient about 100 mmHg suggesting severe pulmonary hypertension.  Moderate LAE.  The mitral valve was thickened and the anterior leaflet had a hockey-stick appearance.  The posterior leaflet was fixed/immobile.  There was severe mitral regurgitation.  There was probably mild mitral stenosis (mean gradient across mitral valve 14 mmHg but think this was elevated due to severe MR => MVA by PHT was 1.68 cm^2).  There was a question of endocarditis.  The mitral valve was thickened in appearance on portions of both leaflets.  This was not definitively endocarditis and the valve could have been thickened due to rheumatic heart disease.  I reviewed the TTE, this actually looked more concerning for endocarditis.  It is also an abnormal mitral valve at baseline at risk for endocarditis.  I would treat  for endocarditis taking both TTE and TEE into consideration.  This patient will need further attention to the diseased mitral valve.  It will most likely need replacement but PA pressure will need to be lowered.  Needs RHC/LHC and aggressive medical management.   Marca Ancona 03/18/2013 3:07 PM    *RADIOLOGY REPORT*   Clinical Data: Fever and cough   CHEST - 2 VIEW   Comparison: None.   Findings: There is patchy airspace opacity at the right lung base with coarse reticular opacities at both lung bases.  The lungs are otherwise clear.  No pleural effusion or pneumothorax.   Cardiac silhouette is mildly enlarged.  No mediastinal or hilar masses.   IMPRESSION: Right lung base infiltrate.  This projects in the right middle lobe with some also suspected in the right lower lobe.     Original Report Authenticated By: Amie Portland, M.D.    *RADIOLOGY REPORT*   Clinical Data: 52 year old female with endocarditis.  Query source.   ORTHOPANTOGRAM/PANORAMIC   Comparison: None.   Findings: Mandible intact. Bone mineralization is within normal limits.  Prominent dental caries left maxillary anterior molar. Questionable dental caries right medial maxillary incisor. Maxillary sinuses appear normally pneumatized.   IMPRESSION: 1.  Prominent dental caries left maxillary anterior molar (tooth #14). 2.  Possible dental caries right medial maxillary incisor (tooth #8).     Original Report Authenticated By: Erskine Speed, M.D    Assessment/Plan:  She has a very abnormal, rheumatic-appearing mitral valve with severe MR and possibly some stenosis with acute congestive heart failure symptoms. It is not clear if she has endocarditis with no definite vegetations and negative blood cultures but her hx of fever and chills preceding admission is suggestive of this. She will require mitral valve surgery and most likely replacement with a rheumatic-appearing valve. She will need right and  left heat catheterization. She will need dental evaluation with orthopantogram showing significant dental caries. She would probably be a candidate for mini-mitral surgery and I will discuss with Dr. Cornelius Moras when he returns from vacation on Monday.  BARTLE,BRYAN K 03/21/2013, 10:22 AM

## 2013-03-21 NOTE — Progress Notes (Signed)
PFT completed. Unconfirmed results placed in Progress Notes of [facility]. 

## 2013-03-22 ENCOUNTER — Encounter (HOSPITAL_COMMUNITY)
Admission: EM | Disposition: A | Discharge status: Home / Self Care | Payer: Self-pay | Source: Home / Self Care | Attending: Internal Medicine

## 2013-03-22 DIAGNOSIS — I509 Heart failure, unspecified: Secondary | ICD-10-CM

## 2013-03-22 DIAGNOSIS — I059 Rheumatic mitral valve disease, unspecified: Secondary | ICD-10-CM

## 2013-03-22 HISTORY — PX: LEFT AND RIGHT HEART CATHETERIZATION WITH CORONARY ANGIOGRAM: SHX5449

## 2013-03-22 LAB — BASIC METABOLIC PANEL
BUN: 9 mg/dL (ref 6–23)
CO2: 23 mEq/L (ref 19–32)
Calcium: 9.2 mg/dL (ref 8.4–10.5)
Creatinine, Ser: 0.83 mg/dL (ref 0.50–1.10)
Glucose, Bld: 101 mg/dL — ABNORMAL HIGH (ref 70–99)
Sodium: 136 mEq/L (ref 135–145)

## 2013-03-22 LAB — CULTURE, BLOOD (ROUTINE X 2): Culture: NO GROWTH

## 2013-03-22 LAB — CBC
HCT: 27.7 % — ABNORMAL LOW (ref 36.0–46.0)
MCH: 31.4 pg (ref 26.0–34.0)
MCV: 89.6 fL (ref 78.0–100.0)
RDW: 14.7 % (ref 11.5–15.5)
WBC: 7 10*3/uL (ref 4.0–10.5)

## 2013-03-22 LAB — POCT I-STAT 3, VENOUS BLOOD GAS (G3P V)
O2 Saturation: 63 %
pCO2, Ven: 34.1 mmHg — ABNORMAL LOW (ref 45.0–50.0)
pH, Ven: 7.381 — ABNORMAL HIGH (ref 7.250–7.300)
pO2, Ven: 33 mmHg (ref 30.0–45.0)

## 2013-03-22 LAB — POCT I-STAT 3, ART BLOOD GAS (G3+)
Bicarbonate: 21.2 mEq/L (ref 20.0–24.0)
TCO2: 22 mmol/L (ref 0–100)
pCO2 arterial: 30.5 mmHg — ABNORMAL LOW (ref 35.0–45.0)
pH, Arterial: 7.451 — ABNORMAL HIGH (ref 7.350–7.450)
pO2, Arterial: 71 mmHg — ABNORMAL LOW (ref 80.0–100.0)

## 2013-03-22 SURGERY — LEFT AND RIGHT HEART CATHETERIZATION WITH CORONARY ANGIOGRAM
Anesthesia: LOCAL

## 2013-03-22 MED ORDER — MIDAZOLAM HCL 2 MG/2ML IJ SOLN
INTRAMUSCULAR | Status: AC
  Filled 2013-03-22: qty 2

## 2013-03-22 MED ORDER — HEPARIN (PORCINE) IN NACL 2-0.9 UNIT/ML-% IJ SOLN
INTRAMUSCULAR | Status: AC
  Filled 2013-03-22: qty 1000

## 2013-03-22 MED ORDER — NITROGLYCERIN 0.2 MG/ML ON CALL CATH LAB
INTRAVENOUS | Status: AC
  Filled 2013-03-22: qty 1

## 2013-03-22 MED ORDER — ONDANSETRON HCL 4 MG/2ML IJ SOLN: 4.0000 mg | Freq: Four times a day (QID) | INTRAMUSCULAR | Status: DC | PRN

## 2013-03-22 MED ORDER — SODIUM CHLORIDE 0.9 % IV SOLN: INTRAVENOUS | Status: AC

## 2013-03-22 MED ORDER — FUROSEMIDE 40 MG PO TABS
40.0000 mg | ORAL_TABLET | Freq: Two times a day (BID) | ORAL | Status: DC
  Administered 2013-03-22 – 2013-03-23 (×2): 40 mg via ORAL
  Filled 2013-03-22 (×4): qty 1

## 2013-03-22 MED ORDER — LIDOCAINE HCL (PF) 1 % IJ SOLN
INTRAMUSCULAR | Status: AC
  Filled 2013-03-22: qty 30

## 2013-03-22 NOTE — Progress Notes (Signed)
    SUBJECTIVE: Feels much better this am. No chest pain. No issues while lying supine.   BP 85/48  Pulse 86  Temp(Src) 98.5 F (36.9 C) (Oral)  Resp 18  Ht 5' 2" (1.575 m)  Wt 104 lb 9.6 oz (47.446 kg)  BMI 19.13 kg/m2  SpO2 99%  LMP 02/19/2013  Intake/Output Summary (Last 24 hours) at 03/22/13 0655 Last data filed at 03/22/13 0549  Gross per 24 hour  Intake    460 ml  Output   2525 ml  Net  -2065 ml    PHYSICAL EXAM General: Well developed, well nourished, in no acute distress. Alert and oriented x 3.  Psych:  Good affect, responds appropriately Neck: No JVD. No masses noted.  Lungs: Clear bilaterally with no wheezes or rhonci noted.  Heart: RRR with systolic murmur noted. Abdomen: Bowel sounds are present. Soft, non-tender.  Extremities: No lower extremity edema.   LABS: Basic Metabolic Panel:  Recent Labs  03/21/13 0545 03/22/13 0455  NA 135 136  K 3.4* 4.2  CL 100 102  CO2 26 23  GLUCOSE 91 101*  BUN 7 9  CREATININE 0.74 0.83  CALCIUM 9.1 9.2  MG  --  2.1   CBC:  Recent Labs  03/19/13 1812 03/21/13 0545 03/22/13 0455  WBC 9.7 7.6 7.0  NEUTROABS 7.6  --   --   HGB 10.7* 9.8* 9.7*  HCT 30.5* 27.9* 27.7*  MCV 91.3 89.7 89.6  PLT 502* 508* 537*   Current Meds: . aspirin  81 mg Oral Daily  . cefTRIAXone (ROCEPHIN)  IV  2 g Intravenous Q12H  . furosemide  80 mg Intravenous BID  . metoprolol succinate  50 mg Oral Daily  . sodium chloride  3 mL Intravenous Q12H  . vancomycin  750 mg Intravenous Q12H     ASSESSMENT AND PLAN: 51 yo with history of heart murmur presented with dyspnea and was found to have PNA by CXR. Severe MR and possible MV vegetation by TEE.   1. 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 There is mild to moderate mitral stenosis (mean gradient very elevated but suspect this is due to high flow with severe MR => valve area in mild MS range by PHT). She  says she has had a heart murmur since childhood. Probable rheumatic fever at some point. She had not been dyspneic until recently => concern for endocarditis involving a valve that was abnormal at baseline with worsening of MR. TTE suggestive of endocarditis. The TEE showed a thickened MV but could be from rheumatic heart disease. She has been seen by Dr. Bartle and planning is place for mitral valve replacement next week.  -RHC/LHC today.  2. ID: She is on vanco/ceftriaxone. ID consult team on board.   3. Acute on chronic diastolic CHF: EF normal by echo with severe MR/probably mild MS. Volume is much better. Will adjust Lasix dose after RHC today.   4. SVT: Controlled on metoprolol.       Mary [name]  6/27/20146:55 AM  

## 2013-03-22 NOTE — Interval H&P Note (Signed)
History and Physical Interval Note:  03/22/2013 7:03 AM  Mary [name]  has presented today for cardiac cath with the diagnosis of mr  The various methods of treatment have been discussed with the patient and family. After consideration of risks, benefits and other options for treatment, the patient has consented to  Procedure(s): LEFT AND RIGHT HEART CATHETERIZATION WITH CORONARY ANGIOGRAM (N/A) as a surgical intervention .  The patient's history has been reviewed, patient examined, no change in status, stable for surgery.  I have reviewed the patient's chart and labs.  Questions were answered to the patient's satisfaction.     Daran Favaro

## 2013-03-22 NOTE — CV Procedure (Signed)
    Cardiac Catheterization Operative Report  Mary [name] [id number] 6/27/20148:15 AM No PCP Per Patient  Procedure Performed:  1. Left Heart Catheterization 2. Selective Coronary Angiography 3. Right Heart Catheterization 4. Left ventricular angiogram 5. Distal Aortogram  Operator: Verne Carrow, MD  Indication:  52 yo female admitted with CHF and found to have severe MR. LV function normal. Plans for mitral valve replacement.                               Procedure Details: The risks, benefits, complications, treatment options, and expected outcomes were discussed with the patient. The patient and/or family concurred with the proposed plan, giving informed consent. The patient was brought to the cath lab after IV hydration was begun and oral premedication was given. The patient was further sedated with Versed. The left groin was prepped and draped in the usual manner. Using the modified Seldinger access technique, a 5 French sheath was placed in the left femoral artery. A 7 French sheath was inserted into the right femoral vein. A balloon tipped catheter was used to perform a right heart catheterization. Standard diagnostic catheters were used to perform selective coronary angiography. A pigtail catheter was used to perform a left ventricular angiogram and a distal aortogram. There were no immediate complications. The patient was taken to the recovery area in stable condition.   Hemodynamic Findings: Ao:  97/63              LV: 100/4/10 RA: 4               RV: 43/5/6 PA:  49/21 (mean 34)     PCWP:  12 Fick Cardiac Output: 4.57 L/min Fick Cardiac Index: 3.15 L/min/m2 Central Aortic Saturation: 95% Pulmonary Artery Saturation: 63%  Angiographic Findings:  Left main: No obstructive disease.   Left Anterior Descending Artery: Large caliber vessel that courses to the apex. Moderate caliber diagonal branch. No obstructive disease noted.   Circumflex Artery: Moderate  caliber vessel with moderate caliber OM branch. No obstructive disease.   Right Coronary Artery: Moderate caliber dominant vessel with no obstructive disease.   Left Ventricular Angiogram: LVEF 65-70%. 3+ MR  Distal Aortogram: No aneurysm. No stenosis.   Impression: 1. No angiographic evidence of CAD 2. Normal LV function.  3. Severe MR  Recommendations: Proceed with planning for MV replacement.        Complications:  None; patient tolerated the procedure well.

## 2013-03-22 NOTE — Progress Notes (Signed)
    [facility]    Date of Admission:  03/15/2013   Total days of antibiotics 6        Day 6 ceftriaxone         Day 6 vancomycin           ID: Mary [name] is a 52 y.o. female with hx of rheumatic appearing mitral murmur who presents with fevers and symptoms consistent with CHF found to have severe MR, mod TR with severe pHTN with concern for endocarditis Principal Problem:   CAP (community acquired pneumonia) Active Problems:   Tachypnea   Leucocytosis   Undiagnosed cardiac murmurs   CHF (congestive heart failure)   SVT (supraventricular tachycardia)   Infectious endocarditis    Subjective: underwent heart catherization this morning and tolerated the procedure by laying flat. No longer has 3-4 pillow orthopnea. She feels that her fevers have subsided. She is afebrile.  Medications:  . aspirin  81 mg Oral Daily  . cefTRIAXone (ROCEPHIN)  IV  2 g Intravenous Q12H  . furosemide  40 mg Oral BID  . metoprolol succinate  50 mg Oral Daily  . vancomycin  750 mg Intravenous Q12H    Objective: Vital signs in last 24 hours: Temp:  [98.5 F (36.9 C)-98.8 F (37.1 C)] 98.8 F (37.1 C) (06/27 2053) Pulse Rate:  [50-91] 80 (06/27 2053) Resp:  [18-20] 19 (06/27 2053) BP: (85-124)/(48-68) 99/64 mmHg (06/27 2053) SpO2:  [98 %-100 %] 100 % (06/27 2053) GEN- The patient is well appearing, alert and oriented x 3 today.  Head- normocephalic, atraumatic  Eyes- Sclera clear, conjunctiva pink  Ears- hearing intact  Oropharynx- clear , good dentition  Neck- supple, no JVP  Lymph- no cervical lymphadenopathy  Lungs- Clear to ausculation bilaterally, normal work of breathing  Heart- Regular rate and rhythm, 2/6 SEM LLSB, 2/6 holosystolic murmur at the apex  GI- soft, NT, ND, + BS  Extremities - no c/c/e Lab Results  Recent Labs  03/21/13 0545 03/22/13 0455  WBC 7.6 7.0  HGB 9.8* 9.7*  HCT 27.9* 27.7*  NA 135 136  K 3.4* 4.2  CL 100 102  CO2 26 23    BUN 7 9  CREATININE 0.74 0.83   Liver Panel No results found for this basename: PROT, ALBUMIN, AST, ALT, ALKPHOS, BILITOT, BILIDIR, IBILI,  in the last 72 hours Sedimentation Rate No results found for this basename: ESRSEDRATE,  in the last 72 hours C-Reactive Protein No results found for this basename: CRP,  in the last 72 hours  Microbiology: 6/20 blood cx x 2 NGTD 6/24 blood cx x 2 NGTD  Studies/Results: No results found.   Assessment/Plan: 52yo F with hx of rheumatic like mitral vavle p/w acute CHF and fevers, found to have severe MR with abnormal mitravl valve concerning for endocarditis.  Blood cx NGTD thus facr, orthpantogram suggests several dental caries  - continue with vancomycin and ceftriaxone for empiric coverage for endocarditis - would recommend for her to get dental evaluation prior to her cardiac surgery - once she goes to the OR, please gave tissue sent for 16s sequencing and culture.  Rob comer to [employer] over the weekend.  Duke Salvia Drue Second MD MPH [facility] [phone]   Drue Second [facility] Cell: [phone] Pager: [phone]  03/22/2013, 9:28 PM

## 2013-03-22 NOTE — Progress Notes (Signed)
[facility] PROGRESS NOTE  Mary [name] ZOX:[id number] DOB: [birth year] DOA: 03/15/2013 PCP: No PCP Per Patient  HPI/Subjective: 52 yo AAF feeling good today. She states she only slept on 1 pillows last night and the bed was flat. She required O2 in the last 24 hours. She states vaginal itching is worse than yesterday. She has no other complaints today. She denies, chest pain, N/V/D, abdominal pain.  Assessment/Plan: CAP (community acquired pneumonia)  - Afebrile, clinically better. Leukocytosis has resolved  - Urine streptococcal antigen and legionella antigen negative  - Blood culture on 6/20 negative  - Pt asymptomatic as of 6/26 - Completed 5 day course of Azithromycin  - Resolved   Mitral Valve Regurgitation with ? Endocarditis  - 3/6 HSM at apex  - TTE on 6/20-showing severe MR with ?large vegetation  - TEE on 6/23 - showing severe MR with mild MS, cannot r/o endocarditis  - Orthopnea improved - Cardiology d/c lasix  - LHC/RHC performed this morning (6/27) - findings listed below  -blood cultures repeated (6/24) per Dr. Drue Second - NGTD, final results pending  - continue Vancomycin and Rocephin   Acute on chronic diastolic CHF  - EF normal by echo with severe MR/probably mild MS. She is volume overloaded on exam with dyspnea and orthopnea.  - Symptoms have improved. Lasix d/c by cardiology   Hypokalemia  - Serum K - 3.4 (6/26)  - Resolved - continue to monitor BMP  Supraventricular tachycardia  - Per patient, she gets episodes of palpitations very frequently at home-SVT occurred on 6/21-Successfully converted back to sinus rhythm with just 6 mg of adenosine on 6/21  - Suspect, this is not a new issue  - No repeat episodes in last 24 hours  - Now stable with Toprol XL 25 mg   Episode of Wide complex tachycardia  -Keep K above 4- stable today  -stable - no repeat episodes  Heart murmur  - from severe MR-Echo shows normal EF  - Continue with plan above    Vulvovaginal Candidiasis  - likely secondary to antibiotic use  - Was given Diflucan 6/25  - Symptoms have returned (6/27) - will start topical antifungal.    DVT Prophylaxis:  Prophylactic Lovenox    Code Status: Full Family Communication: None at bedside Disposition Plan: Inpatient   Consultants:  Cardiology  Procedures:  Procedure Performed:  1. Left Heart Catheterization 2. Selective Coronary Angiography 3. Right Heart Catheterization 4. Left ventricular angiogram 5. Distal Aortogram Impression:  1. No angiographic evidence of CAD  2. Normal LV function.  3. Severe MR   Antibiotics: Azithromax 500 mg po - 6/20 >> 6/24  Ceftroaxone 1 g IV - 6/20 >>  Vancomycin 750 mg/150 ml IV - 6/22 >>      Objective: Filed Vitals:   03/21/13 2040 03/22/13 0506 03/22/13 0737 03/22/13 1036  BP: 97/59 85/48  107/68  Pulse: 93 86 50 91  Temp: 98 F (36.7 C) 98.5 F (36.9 C)    TempSrc: Oral Oral    Resp: 18 18    Height:      Weight:      SpO2: 100% 99%      Intake/Output Summary (Last 24 hours) at 03/22/13 1448 Last data filed at 03/22/13 0549  Gross per 24 hour  Intake      0 ml  Output   1325 ml  Net  -1325 ml   Filed Weights   03/15/13 2022 03/20/13 0513 03/21/13 0300  Weight: 51.483  kg (113 lb 8 oz) 51.665 kg (113 lb 14.4 oz) 47.446 kg (104 lb 9.6 oz)    Exam:  General:  A & Ox3, NAD, sitting comfortably in chair Cardiovascular: RRR, 3/6 HSM at apex, no g/r  Respiratory: CTA bilaterally, no tachypnea  Abdomen: BS+, soft, NT, ND, no masses/organomegaly  Musculoskeletal: no peripheral edema   Data Reviewed: Basic Metabolic Panel:  Recent Labs Lab 03/15/13 1502 03/15/13 2023 03/17/13 0510 03/19/13 1812 03/20/13 0530 03/21/13 0545 03/22/13 0455  NA 137  --  133* 132* 135 135 136  K 3.9  --  3.2* 4.5 4.3 3.4* 4.2  CL 103  --  101 99 102 100 102  CO2 22  --  22 23 25 26 23   GLUCOSE 98  --  101* 100* 83 91 101*  BUN 8  --  6 4* 4* 7 9   CREATININE 0.69 0.60 0.67 0.68 0.72 0.74 0.83  CALCIUM 9.2  --  8.8 9.6 9.1 9.1 9.2  MG  --  1.8 1.7  --   --   --  2.1  PHOS  --  2.7  --   --   --   --   --    CBC:  Recent Labs Lab 03/16/13 0500 03/17/13 0510 03/19/13 1812 03/21/13 0545 03/22/13 0455  WBC 8.5 7.6 9.7 7.6 7.0  NEUTROABS 6.9  --  7.6  --   --   HGB 9.8* 10.5* 10.7* 9.8* 9.7*  HCT 28.1* 30.7* 30.5* 27.9* 27.7*  MCV 92.1 92.7 91.3 89.7 89.6  PLT 331 415* 502* 508* 537*   Cardiac Enzymes:  Recent Labs Lab 03/15/13 1728  CKTOTAL 61  CKMB 1.0   BNP (last 3 results)  Recent Labs  03/15/13 1502 03/15/13 2023  PROBNP 4891.0* 4536.0*     Recent Results (from the past 240 hour(s))  CULTURE, BLOOD (ROUTINE X 2)     Status: None   Collection Time    03/15/13  8:10 PM      Result Value Range Status   Specimen Description BLOOD RIGHT ARM   Final   Special Requests BOTTLES DRAWN AEROBIC AND ANAEROBIC 10CC EACH   Final   Culture  Setup Time 03/16/2013 03:45   Final   Culture NO GROWTH 5 DAYS   Final   Report Status 03/22/2013 FINAL   Final  CULTURE, BLOOD (ROUTINE X 2)     Status: None   Collection Time    03/15/13  8:20 PM      Result Value Range Status   Specimen Description BLOOD RIGHT ARM   Final   Special Requests BOTTLES DRAWN AEROBIC AND ANAEROBIC 10CC EACH   Final   Culture  Setup Time 03/16/2013 03:44   Final   Culture NO GROWTH 5 DAYS   Final   Report Status 03/22/2013 FINAL   Final  CULTURE, BLOOD (ROUTINE X 2)     Status: None   Collection Time    03/19/13  4:23 PM      Result Value Range Status   Specimen Description BLOOD LEFT ARM   Final   Special Requests BOTTLES DRAWN AEROBIC AND ANAEROBIC 10CC   Final   Culture  Setup Time 03/20/2013 04:07   Final   Culture     Final   Value:        BLOOD CULTURE RECEIVED NO GROWTH TO DATE CULTURE WILL BE HELD FOR 5 DAYS BEFORE ISSUING A FINAL NEGATIVE REPORT   Report Status PENDING  Incomplete  CULTURE, BLOOD (ROUTINE X 2)     Status: None    Collection Time    03/19/13  4:24 PM      Result Value Range Status   Specimen Description BLOOD LEFT ARM   Final   Special Requests BOTTLES DRAWN AEROBIC AND ANAEROBIC 10CC   Final   Culture  Setup Time 03/20/2013 04:07   Final   Culture     Final   Value:        BLOOD CULTURE RECEIVED NO GROWTH TO DATE CULTURE WILL BE HELD FOR 5 DAYS BEFORE ISSUING A FINAL NEGATIVE REPORT   Report Status PENDING   Incomplete     Studies: No results found.  Scheduled Meds: . aspirin  81 mg Oral Daily  . cefTRIAXone (ROCEPHIN)  IV  2 g Intravenous Q12H  . furosemide  40 mg Oral BID  . metoprolol succinate  50 mg Oral Daily  . vancomycin  750 mg Intravenous Q12H    Principal Problem:   CAP (community acquired pneumonia) Active Problems:   Tachypnea   Leucocytosis   Undiagnosed cardiac murmurs   CHF (congestive heart failure)   SVT (supraventricular tachycardia)   Infectious endocarditis    Ralene Muskrat PA-S [facility]  03/22/2013, 2:48 PM  LOS: 7 days      Addendum  Patient seen and examined, chart and data base reviewed.  I agree with the above assessment and plan.  For full details please see Mrs. Ralene Muskrat PA-S note.   Clint Lipps, MD [facility] Pager: [phone] 03/22/2013, 4:50 PM

## 2013-03-22 NOTE — H&P (View-Only) (Signed)
    SUBJECTIVE: Feels much better this am. No chest pain. No issues while lying supine.   BP 85/48  Pulse 86  Temp(Src) 98.5 F (36.9 C) (Oral)  Resp 18  Ht 5\' 2"  (1.575 m)  Wt 104 lb 9.6 oz (47.446 kg)  BMI 19.13 kg/m2  SpO2 99%  LMP 02/19/2013  Intake/Output Summary (Last 24 hours) at 03/22/13 0655 Last data filed at 03/22/13 0549  Gross per 24 hour  Intake    460 ml  Output   2525 ml  Net  -2065 ml    PHYSICAL EXAM General: Well developed, well nourished, in no acute distress. Alert and oriented x 3.  Psych:  Good affect, responds appropriately Neck: No JVD. No masses noted.  Lungs: Clear bilaterally with no wheezes or rhonci noted.  Heart: RRR with systolic murmur noted. Abdomen: Bowel sounds are present. Soft, non-tender.  Extremities: No lower extremity edema.   LABS: Basic Metabolic Panel:  Recent Labs  08/65/78 0545 03/22/13 0455  NA 135 136  K 3.4* 4.2  CL 100 102  CO2 26 23  GLUCOSE 91 101*  BUN 7 9  CREATININE 0.74 0.83  CALCIUM 9.1 9.2  MG  --  2.1   CBC:  Recent Labs  03/19/13 1812 03/21/13 0545 03/22/13 0455  WBC 9.7 7.6 7.0  NEUTROABS 7.6  --   --   HGB 10.7* 9.8* 9.7*  HCT 30.5* 27.9* 27.7*  MCV 91.3 89.7 89.6  PLT 502* 508* 537*   Current Meds: . aspirin  81 mg Oral Daily  . cefTRIAXone (ROCEPHIN)  IV  2 g Intravenous Q12H  . furosemide  80 mg Intravenous BID  . metoprolol succinate  50 mg Oral Daily  . sodium chloride  3 mL Intravenous Q12H  . vancomycin  750 mg Intravenous Q12H     ASSESSMENT AND PLAN: 52 yo with history of heart murmur presented with dyspnea and was found to have PNA by CXR. Severe MR and possible MV vegetation by TEE.   1. 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 There is mild to moderate mitral stenosis (mean gradient very elevated but suspect this is due to high flow with severe MR => valve area in mild MS range by PHT). She  says she has had a heart murmur since childhood. Probable rheumatic fever at some point. She had not been dyspneic until recently => concern for endocarditis involving a valve that was abnormal at baseline with worsening of MR. TTE suggestive of endocarditis. The TEE showed a thickened MV but could be from rheumatic heart disease. She has been seen by Dr. Laneta Simmers and planning is place for mitral valve replacement next week.  -RHC/LHC today.  2. ID: She is on vanco/ceftriaxone. ID consult team on board.   3. Acute on chronic diastolic CHF: EF normal by echo with severe MR/probably mild MS. Volume is much better. Will adjust Lasix dose after RHC today.   4. SVT: Controlled on metoprolol.       Mary [name]  6/27/20146:55 AM

## 2013-03-23 DIAGNOSIS — I330 Acute and subacute infective endocarditis: Secondary | ICD-10-CM

## 2013-03-23 DIAGNOSIS — I052 Rheumatic mitral stenosis with insufficiency: Secondary | ICD-10-CM

## 2013-03-23 HISTORY — DX: Rheumatic mitral stenosis with insufficiency: I05.2

## 2013-03-23 LAB — BASIC METABOLIC PANEL
BUN: 9 mg/dL (ref 6–23)
CO2: 25 mEq/L (ref 19–32)
Calcium: 9.1 mg/dL (ref 8.4–10.5)
Chloride: 104 mEq/L (ref 96–112)
Creatinine, Ser: 0.86 mg/dL (ref 0.50–1.10)
Glucose, Bld: 90 mg/dL (ref 70–99)

## 2013-03-23 LAB — CBC
HCT: 27.7 % — ABNORMAL LOW (ref 36.0–46.0)
MCH: 30.7 pg (ref 26.0–34.0)
MCV: 90.5 fL (ref 78.0–100.0)
Platelets: 571 10*3/uL — ABNORMAL HIGH (ref 150–400)
RDW: 15 % (ref 11.5–15.5)
WBC: 6.2 10*3/uL (ref 4.0–10.5)

## 2013-03-23 MED ORDER — FUROSEMIDE 40 MG PO TABS
40.0000 mg | ORAL_TABLET | Freq: Every day | ORAL | Status: DC
  Administered 2013-03-24 – 2013-03-26 (×3): 40 mg via ORAL
  Filled 2013-03-23 (×3): qty 1

## 2013-03-23 MED ORDER — CLOTRIMAZOLE 2 % VA CREA
1.0000 | TOPICAL_CREAM | Freq: Every day | VAGINAL | Status: DC
  Filled 2013-03-23: qty 22.2

## 2013-03-23 MED ORDER — CLOTRIMAZOLE 1 % VA CREA
1.0000 | TOPICAL_CREAM | Freq: Every day | VAGINAL | Status: DC
  Administered 2013-03-23 – 2013-03-25 (×3): 1 via VAGINAL
  Filled 2013-03-23: qty 45

## 2013-03-23 NOTE — Progress Notes (Signed)
CARDIAC REHAB PHASE I   PRE:  Rate/Rhythm: 76 SR  BP:  Supine:   Sitting: 80/50  Standing:    SaO2: 98 RA  MODE:  Ambulation: 1100 ft   POST:  Rate/Rhythm: 80  BP:  Supine:   Sitting: 86/50  Standing:    SaO2: 99 RA 1450-1530 Pt tolerated ambulation well without c/o. Gait steady. BP low before and after walk, she denies any dizziness with walking.Pt back to recliner after walk with call light in reach. Gave pt going for heart surgery booklet and instructed her how to watch preparing for heart surgery video on TV.   Melina Copa RN 03/23/2013 3:37 PM

## 2013-03-23 NOTE — Progress Notes (Signed)
Pt has a low bp of 85/51. rn paged md on call and is awaiting further orders. Pt is asympotmatic

## 2013-03-23 NOTE — Progress Notes (Signed)
Nutrition Brief Note  Malnutrition Screening Tool result is inaccurate.  Please consult if nutrition needs are identified.  Kastin Cerda, RD, LDN, CNSC Pager [phone] After Hours Pager [phone]   

## 2013-03-23 NOTE — Progress Notes (Signed)
[facility] PROGRESS NOTE  Mary [name] YNW:[id number] DOB: 1960/10/31 DOA: 03/15/2013 PCP: No PCP Per Patient  HPI/Subjective: Feels okay, denies any shortness of breath.  Assessment/Plan: CAP (community acquired pneumonia)  - Afebrile, clinically better. Leukocytosis has resolved  - Urine streptococcal antigen and legionella antigen negative  - Blood culture on 6/20 negative  - Pt asymptomatic as of 6/26 - Completed 5 day course of Azithromycin  - Resolved   Mitral Valve Regurgitation with ? Endocarditis  - 3/6 HSM at apex  - TTE on 6/20-showing severe MR with ?large vegetation  - TEE on 6/23 - showing severe MR with mild MS, cannot r/o endocarditis  - Orthopnea improved - Cardiology d/c lasix  - LHC/RHC performed this morning (6/27) - findings listed below  -blood cultures repeated (6/24) per Dr. Drue Second - NGTD, final results pending  - continue Vancomycin and Rocephin   Acute on chronic diastolic CHF  - EF normal by echo with severe MR/probably mild MS. She is volume overloaded on exam with dyspnea and orthopnea.  - Symptoms have improved. Lasix d/c by cardiology   Hypokalemia  - Serum K - 3.4 (6/26)  - Resolved - continue to monitor BMP  Supraventricular tachycardia  - Per patient, she gets episodes of palpitations very frequently at home-SVT occurred on 6/21-Successfully converted back to sinus rhythm with just 6 mg of adenosine on 6/21  - Suspect, this is not a new issue  - No repeat episodes in last 24 hours  - Now stable with Toprol XL 25 mg   Episode of Wide complex tachycardia  -Keep K above 4- stable today  -stable - no repeat episodes  Heart murmur  - from severe MR-Echo shows normal EF  - Continue with plan above   Vulvovaginal Candidiasis  - likely secondary to antibiotic use  - Was given Diflucan 6/25  - Still complaining about vulvar itching  will start topical antifungal.    DVT Prophylaxis:  Prophylactic Lovenox    Code Status:  Full Family Communication: None at bedside Disposition Plan: Inpatient   Consultants:  Cardiology  Procedures:  Procedure Performed:  1. Left Heart Catheterization 2. Selective Coronary Angiography 3. Right Heart Catheterization 4. Left ventricular angiogram 5. Distal Aortogram Impression:  1. No angiographic evidence of CAD  2. Normal LV function.  3. Severe MR   Antibiotics: Azithromax 500 mg po - 6/20 >> 6/24  Ceftroaxone 1 g IV - 6/20 >>  Vancomycin 750 mg/150 ml IV - 6/22 >>      Objective: Filed Vitals:   03/22/13 1036 03/22/13 1500 03/22/13 2053 03/23/13 0436  BP: 107/68 124/68 99/64 85/51   Pulse: 91 78 80 76  Temp:  98.6 F (37 C) 98.8 F (37.1 C) 98.2 F (36.8 C)  TempSrc:  Oral Oral Oral  Resp:  20 19 18   Height:      Weight:    49.2 kg (108 lb 7.5 oz)  SpO2:  98% 100% 100%    Intake/Output Summary (Last 24 hours) at 03/23/13 1224 Last data filed at 03/23/13 0900  Gross per 24 hour  Intake    878 ml  Output    850 ml  Net     28 ml   Filed Weights   03/20/13 0513 03/21/13 0300 03/23/13 0436  Weight: 51.665 kg (113 lb 14.4 oz) 47.446 kg (104 lb 9.6 oz) 49.2 kg (108 lb 7.5 oz)    Exam:  General:  A & Ox3, NAD, sitting comfortably in chair  Cardiovascular: RRR, 3/6 HSM at apex, no g/r  Respiratory: CTA bilaterally, no tachypnea  Abdomen: BS+, soft, NT, ND, no masses/organomegaly  Musculoskeletal: no peripheral edema   Data Reviewed: Basic Metabolic Panel:  Recent Labs Lab 03/17/13 0510 03/19/13 1812 03/20/13 0530 03/21/13 0545 03/22/13 0455 03/23/13 0430  NA 133* 132* 135 135 136 137  K 3.2* 4.5 4.3 3.4* 4.2 4.3  CL 101 99 102 100 102 104  CO2 22 23 25 26 23 25   GLUCOSE 101* 100* 83 91 101* 90  BUN 6 4* 4* 7 9 9   CREATININE 0.67 0.68 0.72 0.74 0.83 0.86  CALCIUM 8.8 9.6 9.1 9.1 9.2 9.1  MG 1.7  --   --   --  2.1  --    CBC:  Recent Labs Lab 03/17/13 0510 03/19/13 1812 03/21/13 0545 03/22/13 0455 03/23/13 0430  WBC  7.6 9.7 7.6 7.0 6.2  NEUTROABS  --  7.6  --   --   --   HGB 10.5* 10.7* 9.8* 9.7* 9.4*  HCT 30.7* 30.5* 27.9* 27.7* 27.7*  MCV 92.7 91.3 89.7 89.6 90.5  PLT 415* 502* 508* 537* 571*   Cardiac Enzymes: No results found for this basename: CKTOTAL, CKMB, CKMBINDEX, TROPONINI,  in the last 168 hours BNP (last 3 results)  Recent Labs  03/15/13 1502 03/15/13 2023  PROBNP 4891.0* 4536.0*     Recent Results (from the past 240 hour(s))  CULTURE, BLOOD (ROUTINE X 2)     Status: None   Collection Time    03/15/13  8:10 PM      Result Value Range Status   Specimen Description BLOOD RIGHT ARM   Final   Special Requests BOTTLES DRAWN AEROBIC AND ANAEROBIC 10CC EACH   Final   Culture  Setup Time 03/16/2013 03:45   Final   Culture NO GROWTH 5 DAYS   Final   Report Status 03/22/2013 FINAL   Final  CULTURE, BLOOD (ROUTINE X 2)     Status: None   Collection Time    03/15/13  8:20 PM      Result Value Range Status   Specimen Description BLOOD RIGHT ARM   Final   Special Requests BOTTLES DRAWN AEROBIC AND ANAEROBIC 10CC EACH   Final   Culture  Setup Time 03/16/2013 03:44   Final   Culture NO GROWTH 5 DAYS   Final   Report Status 03/22/2013 FINAL   Final  CULTURE, BLOOD (ROUTINE X 2)     Status: None   Collection Time    03/19/13  4:23 PM      Result Value Range Status   Specimen Description BLOOD LEFT ARM   Final   Special Requests BOTTLES DRAWN AEROBIC AND ANAEROBIC 10CC   Final   Culture  Setup Time 03/20/2013 04:07   Final   Culture     Final   Value:        BLOOD CULTURE RECEIVED NO GROWTH TO DATE CULTURE WILL BE HELD FOR 5 DAYS BEFORE ISSUING A FINAL NEGATIVE REPORT   Report Status PENDING   Incomplete  CULTURE, BLOOD (ROUTINE X 2)     Status: None   Collection Time    03/19/13  4:24 PM      Result Value Range Status   Specimen Description BLOOD LEFT ARM   Final   Special Requests BOTTLES DRAWN AEROBIC AND ANAEROBIC 10CC   Final   Culture  Setup Time 03/20/2013 04:07   Final    Culture  Final   Value:        BLOOD CULTURE RECEIVED NO GROWTH TO DATE CULTURE WILL BE HELD FOR 5 DAYS BEFORE ISSUING A FINAL NEGATIVE REPORT   Report Status PENDING   Incomplete     Studies: No results found.  Scheduled Meds: . aspirin  81 mg Oral Daily  . cefTRIAXone (ROCEPHIN)  IV  2 g Intravenous Q12H  . [START ON 03/24/2013] furosemide  40 mg Oral Daily  . metoprolol succinate  50 mg Oral Daily  . vancomycin  750 mg Intravenous Q12H    Principal Problem:   CAP (community acquired pneumonia) Active Problems:   Tachypnea   Leucocytosis   Undiagnosed cardiac murmurs   Acute diastolic heart failure   SVT (supraventricular tachycardia)   Infectious endocarditis   Rheumatic mitral stenosis with regurgitation    Marda Breidenbach A PA-S [facility]  03/23/2013, 12:24 PM  LOS: 8 days      Addendum  Patient seen and examined, chart and data base reviewed.  I agree with the above assessment and plan.  For full details please see Mrs. Ralene Muskrat PA-S note.   Clint Lipps, MD [facility] Pager: [phone] 03/23/2013, 12:24 PM

## 2013-03-23 NOTE — Progress Notes (Signed)
    Consulting cardiologist: Dr. Marca Ancona   Subjective:   Reports improved breathing. Up in room, no dizziness.   Objective:   Temp:  [98.2 F (36.8 C)-98.8 F (37.1 C)] 98.2 F (36.8 C) (06/28 0436) Pulse Rate:  [76-91] 76 (06/28 0436) Resp:  [18-20] 18 (06/28 0436) BP: (85-124)/(51-68) 85/51 mmHg (06/28 0436) SpO2:  [98 %-100 %] 100 % (06/28 0436) Weight:  [108 lb 7.5 oz (49.2 kg)] 108 lb 7.5 oz (49.2 kg) (06/28 0436) Last BM Date: 03/22/13  Filed Weights   03/20/13 0513 03/21/13 0300 03/23/13 0436  Weight: 113 lb 14.4 oz (51.665 kg) 104 lb 9.6 oz (47.446 kg) 108 lb 7.5 oz (49.2 kg)    Intake/Output Summary (Last 24 hours) at 03/23/13 0836 Last data filed at 03/23/13 0448  Gross per 24 hour  Intake    638 ml  Output    850 ml  Net   -212 ml   Exam:  General: Thin, no distress.  Lungs: Clear, nonlabored.  Cardiac: JVP 12, RRR, 2/6 systolic murmur apex.  Extremities: No edema.  Lab Results:  Basic Metabolic Panel:  Recent Labs Lab 03/17/13 0510  03/21/13 0545 03/22/13 0455 03/23/13 0430  NA 133*  < > 135 136 137  K 3.2*  < > 3.4* 4.2 4.3  CL 101  < > 100 102 104  CO2 22  < > 26 23 25   GLUCOSE 101*  < > 91 101* 90  BUN 6  < > 7 9 9   CREATININE 0.67  < > 0.74 0.83 0.86  CALCIUM 8.8  < > 9.1 9.2 9.1  MG 1.7  --   --  2.1  --   < > = values in this interval not displayed.   CBC:  Recent Labs Lab 03/21/13 0545 03/22/13 0455 03/23/13 0430  WBC 7.6 7.0 6.2  HGB 9.8* 9.7* 9.4*  HCT 27.9* 27.7* 27.7*  MCV 89.7 89.6 90.5  PLT 508* 537* 571*      Medications:   Scheduled Medications: . aspirin  81 mg Oral Daily  . cefTRIAXone (ROCEPHIN)  IV  2 g Intravenous Q12H  . furosemide  40 mg Oral BID  . metoprolol succinate  50 mg Oral Daily  . vancomycin  750 mg Intravenous Q12H      PRN Medications:  acetaminophen, ondansetron (ZOFRAN) IV   Assessment:   1. 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  2. Treating emperically for possible endocarditis, cultures negative. ID following.  3. Acute diastolic CHF secondary to valvular heart disease. Has had good diuresis last 2-3 days, RHC yesterday showed PCWP 12, PA mean 34 - not nearly as severe as by noninvasive assessment.  4. No significant coronary artery disease by catheterization.  5. AVNRT, on beta blocker.  6. CAP.   Plan/Discussion:    Patient has been assessed by TCTS, pending dental evaluation, and possible candidate for mini-mitral surgery (Dr. Cornelius Moras to see first of week). Continue antibiotics per ID. Will decrease Lasix to 40 mg daily.   Jonelle Sidle, M.D., F.A.C.C.

## 2013-03-23 NOTE — Progress Notes (Signed)
Per md lynch to continue to monitor pt

## 2013-03-24 MED ORDER — SODIUM CHLORIDE 0.9 % IJ SOLN
3.0000 mL | Freq: Two times a day (BID) | INTRAMUSCULAR | Status: DC
  Administered 2013-03-24 – 2013-03-25 (×4): 3 mL via INTRAVENOUS

## 2013-03-24 NOTE — Progress Notes (Signed)
[facility]  Date of Admission:  03/15/2013  Antibiotics: Vancomycin and ceftriaxone  Subjective: No complaints, feels well, no fever or chils  Objective: Temp:  [98.2 F (36.8 C)-98.3 F (36.8 C)] 98.2 F (36.8 C) (06/29 0618) Pulse Rate:  [74-80] 80 (06/29 1005) Resp:  [18-20] 18 (06/29 0618) BP: (91-98)/(60-61) 98/61 mmHg (06/29 1005) SpO2:  [99 %-100 %] 99 % (06/29 0618) Weight:  [109 lb 9.1 oz (49.7 kg)] 109 lb 9.1 oz (49.7 kg) (06/29 0618)  General: AAO x 3, nad Skin: no rashes Lungs: RRR Cor: +murmur MV Abdomen: soft, nt, nd   Lab Results Lab Results  Component Value Date   WBC 6.2 03/23/2013   HGB 9.4* 03/23/2013   HCT 27.7* 03/23/2013   MCV 90.5 03/23/2013   PLT 571* 03/23/2013    Lab Results  Component Value Date   CREATININE 0.86 03/23/2013   BUN 9 03/23/2013   NA 137 03/23/2013   K 4.3 03/23/2013   CL 104 03/23/2013   CO2 25 03/23/2013    Lab Results  Component Value Date   ALT 13 01/04/2013   AST 28 01/04/2013   ALKPHOS 115 01/04/2013   BILITOT 1.0 01/04/2013      Microbiology: Recent Results (from the past 240 hour(s))  CULTURE, BLOOD (ROUTINE X 2)     Status: None   Collection Time    03/15/13  8:10 PM      Result Value Range Status   Specimen Description BLOOD RIGHT ARM   Final   Special Requests BOTTLES DRAWN AEROBIC AND ANAEROBIC 10CC EACH   Final   Culture  Setup Time 03/16/2013 03:45   Final   Culture NO GROWTH 5 DAYS   Final   Report Status 03/22/2013 FINAL   Final  CULTURE, BLOOD (ROUTINE X 2)     Status: None   Collection Time    03/15/13  8:20 PM      Result Value Range Status   Specimen Description BLOOD RIGHT ARM   Final   Special Requests BOTTLES DRAWN AEROBIC AND ANAEROBIC 10CC EACH   Final   Culture  Setup Time 03/16/2013 03:44   Final   Culture NO GROWTH 5 DAYS   Final   Report Status 03/22/2013 FINAL   Final  CULTURE, BLOOD (ROUTINE X 2)     Status: None   Collection Time    03/19/13  4:23 PM     Result Value Range Status   Specimen Description BLOOD LEFT ARM   Final   Special Requests BOTTLES DRAWN AEROBIC AND ANAEROBIC 10CC   Final   Culture  Setup Time 03/20/2013 04:07   Final   Culture     Final   Value:        BLOOD CULTURE RECEIVED NO GROWTH TO DATE CULTURE WILL BE HELD FOR 5 DAYS BEFORE ISSUING A FINAL NEGATIVE REPORT   Report Status PENDING   Incomplete  CULTURE, BLOOD (ROUTINE X 2)     Status: None   Collection Time    03/19/13  4:24 PM      Result Value Range Status   Specimen Description BLOOD LEFT ARM   Final   Special Requests BOTTLES DRAWN AEROBIC AND ANAEROBIC 10CC   Final   Culture  Setup Time 03/20/2013 04:07   Final   Culture     Final   Value:        BLOOD CULTURE RECEIVED NO GROWTH TO DATE CULTURE WILL BE  HELD FOR 5 DAYS BEFORE ISSUING A FINAL NEGATIVE REPORT   Report Status PENDING   Incomplete    Studies/Results: No results found.  Assessment/Plan: 1) MV vegetation, native valve - cultures remain negative.  She did receive antibiotics several hours prior to blood culture being drawn. -continue with vancomycin and ceftriaxone  Staci Righter, MD [facility] [email or web address] [phone] pager   [phone] cell 03/24/2013, 1:45 PM

## 2013-03-24 NOTE — Progress Notes (Signed)
ANTIBIOTIC CONSULT NOTE   Pharmacy Consult for vancomycin Indication: endocarditis  No Known Allergies  Labs:  Recent Labs  03/22/13 0455 03/23/13 0430  WBC 7.0 6.2  HGB 9.7* 9.4*  PLT 537* 571*  CREATININE 0.83 0.86   Estimated Creatinine Clearance: 61.4 ml/min (by C-G formula based on Cr of 0.86). No results found for this basename: VANCOTROUGH, VANCOPEAK, VANCORANDOM, GENTTROUGH, GENTPEAK, GENTRANDOM, TOBRATROUGH, TOBRAPEAK, TOBRARND, AMIKACINPEAK, AMIKACINTROU, AMIKACIN,  in the last 72 hours   Microbiology: Recent Results (from the past 720 hour(s))  CULTURE, BLOOD (ROUTINE X 2)     Status: None   Collection Time    03/15/13  8:10 PM      Result Value Range Status   Specimen Description BLOOD RIGHT ARM   Final   Special Requests BOTTLES DRAWN AEROBIC AND ANAEROBIC 10CC EACH   Final   Culture  Setup Time 03/16/2013 03:45   Final   Culture NO GROWTH 5 DAYS   Final   Report Status 03/22/2013 FINAL   Final  CULTURE, BLOOD (ROUTINE X 2)     Status: None   Collection Time    03/15/13  8:20 PM      Result Value Range Status   Specimen Description BLOOD RIGHT ARM   Final   Special Requests BOTTLES DRAWN AEROBIC AND ANAEROBIC 10CC EACH   Final   Culture  Setup Time 03/16/2013 03:44   Final   Culture NO GROWTH 5 DAYS   Final   Report Status 03/22/2013 FINAL   Final  CULTURE, BLOOD (ROUTINE X 2)     Status: None   Collection Time    03/19/13  4:23 PM      Result Value Range Status   Specimen Description BLOOD LEFT ARM   Final   Special Requests BOTTLES DRAWN AEROBIC AND ANAEROBIC 10CC   Final   Culture  Setup Time 03/20/2013 04:07   Final   Culture     Final   Value:        BLOOD CULTURE RECEIVED NO GROWTH TO DATE CULTURE WILL BE HELD FOR 5 DAYS BEFORE ISSUING A FINAL NEGATIVE REPORT   Report Status PENDING   Incomplete  CULTURE, BLOOD (ROUTINE X 2)     Status: None   Collection Time    03/19/13  4:24 PM      Result Value Range Status   Specimen Description BLOOD LEFT  ARM   Final   Special Requests BOTTLES DRAWN AEROBIC AND ANAEROBIC 10CC   Final   Culture  Setup Time 03/20/2013 04:07   Final   Culture     Final   Value:        BLOOD CULTURE RECEIVED NO GROWTH TO DATE CULTURE WILL BE HELD FOR 5 DAYS BEFORE ISSUING A FINAL NEGATIVE REPORT   Report Status PENDING   Incomplete    Assessment: 52 yo F admitted 6/20 with suspected PNA, now thought to have probable large vegitation on MV with severe MR from ECHO today.   Continues Vancomycin and high dose Rocephin for ? Endocarditis.  Scr stable, WBC WNL  Blood cultures negative to date  Mitral valve surgery and dental evaluations pending  Goal of Therapy:  Vancomycin trough level 15-20 mcg/ml  Plan:  - Continue Vancomycin 750 mg IV q12h - Continue Rocephin 2 grams iv Q 12 hours - Continue to follow  Thank you. Okey Regal, PharmD [phone] 03/24/2013 12:02 PM

## 2013-03-24 NOTE — Progress Notes (Signed)
    Consulting cardiologist: Dr. Marca Ancona   Subjective:   Feels better - up in room. No dizziness.   Objective:   Temp:  [98.2 F (36.8 C)-98.3 F (36.8 C)] 98.2 F (36.8 C) (06/29 0618) Pulse Rate:  [74-82] 74 (06/29 0618) Resp:  [16-20] 18 (06/29 0618) BP: (89-93)/(57-60) 91/60 mmHg (06/29 0618) SpO2:  [99 %-100 %] 99 % (06/29 0618) Weight:  [109 lb 9.1 oz (49.7 kg)] 109 lb 9.1 oz (49.7 kg) (06/29 0618) Last BM Date: 03/23/13  Filed Weights   03/21/13 0300 03/23/13 0436 03/24/13 0618  Weight: 104 lb 9.6 oz (47.446 kg) 108 lb 7.5 oz (49.2 kg) 109 lb 9.1 oz (49.7 kg)    Intake/Output Summary (Last 24 hours) at 03/24/13 0832 Last data filed at 03/24/13 0626  Gross per 24 hour  Intake    680 ml  Output   1625 ml  Net   -945 ml   Exam:  General: Thin, no distress.  Lungs: Clear, nonlabored.  Cardiac: JVP 12, RRR, 2/6 systolic murmur apex.  Extremities: No edema.  Lab Results:  Basic Metabolic Panel:  Recent Labs Lab 03/21/13 0545 03/22/13 0455 03/23/13 0430  NA 135 136 137  K 3.4* 4.2 4.3  CL 100 102 104  CO2 26 23 25   GLUCOSE 91 101* 90  BUN 7 9 9   CREATININE 0.74 0.83 0.86  CALCIUM 9.1 9.2 9.1  MG  --  2.1  --      CBC:  Recent Labs Lab 03/21/13 0545 03/22/13 0455 03/23/13 0430  WBC 7.6 7.0 6.2  HGB 9.8* 9.7* 9.4*  HCT 27.9* 27.7* 27.7*  MCV 89.7 89.6 90.5  PLT 508* 537* 571*      Medications:   Scheduled Medications: . aspirin  81 mg Oral Daily  . cefTRIAXone (ROCEPHIN)  IV  2 g Intravenous Q12H  . clotrimazole  1 Applicatorful Vaginal QHS  . furosemide  40 mg Oral Daily  . metoprolol succinate  50 mg Oral Daily  . vancomycin  750 mg Intravenous Q12H     PRN Medications: acetaminophen, ondansetron (ZOFRAN) IV   Assessment:   1. 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  2. Treating emperically for possible endocarditis, cultures negative. ID following.  3.  Acute diastolic CHF secondary to valvular heart disease. RHC 6/27 showed PCWP 12, PA mean 34 - not nearly as severe as by noninvasive assessment.  4. No significant coronary artery disease by catheterization.  5. AVNRT, on beta blocker.  6. CAP.   Plan/Discussion:    Patient has been assessed by TCTS, pending dental evaluation, and possible candidate for mini-mitral surgery (Dr. Cornelius Moras to see first of week). Continue antibiotics per ID. Blood pressure low normal - not symptomatic. No change in regimen.  Jonelle Sidle, M.D., F.A.C.C.

## 2013-03-24 NOTE — Progress Notes (Signed)
[facility] PROGRESS NOTE  Mary [name]:[id number] DOB: 1961-01-12 DOA: 03/15/2013 PCP: No PCP Per Patient  HPI/Subjective: Denies any shortness of breath. Denies fever feels very okay. Needs dental evaluation, will call consult in the morning.  Assessment/Plan: CAP (community acquired pneumonia)  - Afebrile, clinically better. Leukocytosis has resolved  - Urine streptococcal antigen and legionella antigen negative  - Blood culture on 6/20 negative  - Pt asymptomatic as of 6/26 - Completed 5 day course of Azithromycin   Mitral Valve Regurgitation with ? Endocarditis  - 3/6 HSM at apex  - TTE on 6/20-showing severe MR with ?large vegetation  - TEE on 6/23 - showing severe MR with mild MS, cannot r/o endocarditis  - Orthopnea improved -Lasix adjusted by cardiology. - LHC/RHC performed this morning (6/27) - findings listed below  -blood cultures repeated (6/24) per Dr. Drue Second - NGTD, final results pending  - continue Vancomycin and Rocephin. - To be evaluated by cardiothoracic surgery to decide about what type of intervention patient will have.  Acute on chronic diastolic CHF  - EF normal by echo with severe MR/probably mild MS. She is volume overloaded on exam with dyspnea and orthopnea.  - Symptoms have improved.   Hypokalemia  - Serum K - 3.4 (6/26)  - Resolved - continue to monitor BMP  Supraventricular tachycardia  - Per patient, she gets episodes of palpitations very frequently at home-SVT occurred on 6/21-Successfully converted back to sinus rhythm with just 6 mg of adenosine on 6/21  - Suspect, this is not a new issue  - No repeat episodes in last 24 hours  - Now stable with Toprol XL 25 mg   Episode of Wide complex tachycardia  -Keep K above 4- stable today  -stable - no repeat episodes  Heart murmur  - from severe MR-Echo shows normal EF  - Continue with plan above   Vulvovaginal Candidiasis  - likely secondary to antibiotic use  - Was given  Diflucan 6/25  - Still complaining about vulvar itching  will start topical antifungal.    DVT Prophylaxis:  Prophylactic Lovenox    Code Status: Full Family Communication: None at bedside Disposition Plan: Inpatient   Consultants:  Cardiology  Procedures:  Procedure Performed:  1. Left Heart Catheterization 2. Selective Coronary Angiography 3. Right Heart Catheterization 4. Left ventricular angiogram 5. Distal Aortogram Impression:  1. No angiographic evidence of CAD  2. Normal LV function.  3. Severe MR   Antibiotics: Azithromax 500 mg po - 6/20 >> 6/24  Ceftroaxone 1 g IV - 6/20 >>  Vancomycin 750 mg/150 ml IV - 6/22 >>      Objective: Filed Vitals:   03/23/13 0436 03/23/13 1328 03/23/13 2200 03/24/13 0618  BP: 85/51 89/57 93/60  91/60  Pulse: 76 82 75 74  Temp: 98.2 F (36.8 C) 98.2 F (36.8 C) 98.3 F (36.8 C) 98.2 F (36.8 C)  TempSrc: Oral Oral Oral Oral  Resp: 18 16 20 18   Height:      Weight: 49.2 kg (108 lb 7.5 oz)   49.7 kg (109 lb 9.1 oz)  SpO2: 100% 100% 100% 99%    Intake/Output Summary (Last 24 hours) at 03/24/13 1002 Last data filed at 03/24/13 0626  Gross per 24 hour  Intake    440 ml  Output   1625 ml  Net  -1185 ml   Filed Weights   03/21/13 0300 03/23/13 0436 03/24/13 0618  Weight: 47.446 kg (104 lb 9.6 oz) 49.2 kg (108  lb 7.5 oz) 49.7 kg (109 lb 9.1 oz)    Exam:  General:  A & Ox3, NAD, sitting comfortably in chair Cardiovascular: RRR, 3/6 HSM at apex, no g/r  Respiratory: CTA bilaterally, no tachypnea  Abdomen: BS+, soft, NT, ND, no masses/organomegaly  Musculoskeletal: no peripheral edema   Data Reviewed: Basic Metabolic Panel:  Recent Labs Lab 03/19/13 1812 03/20/13 0530 03/21/13 0545 03/22/13 0455 03/23/13 0430  NA 132* 135 135 136 137  K 4.5 4.3 3.4* 4.2 4.3  CL 99 102 100 102 104  CO2 23 25 26 23 25   GLUCOSE 100* 83 91 101* 90  BUN 4* 4* 7 9 9   CREATININE 0.68 0.72 0.74 0.83 0.86  CALCIUM 9.6 9.1  9.1 9.2 9.1  MG  --   --   --  2.1  --    CBC:  Recent Labs Lab 03/19/13 1812 03/21/13 0545 03/22/13 0455 03/23/13 0430  WBC 9.7 7.6 7.0 6.2  NEUTROABS 7.6  --   --   --   HGB 10.7* 9.8* 9.7* 9.4*  HCT 30.5* 27.9* 27.7* 27.7*  MCV 91.3 89.7 89.6 90.5  PLT 502* 508* 537* 571*   Cardiac Enzymes: No results found for this basename: CKTOTAL, CKMB, CKMBINDEX, TROPONINI,  in the last 168 hours BNP (last 3 results)  Recent Labs  03/15/13 1502 03/15/13 2023  PROBNP 4891.0* 4536.0*     Recent Results (from the past 240 hour(s))  CULTURE, BLOOD (ROUTINE X 2)     Status: None   Collection Time    03/15/13  8:10 PM      Result Value Range Status   Specimen Description BLOOD RIGHT ARM   Final   Special Requests BOTTLES DRAWN AEROBIC AND ANAEROBIC 10CC EACH   Final   Culture  Setup Time 03/16/2013 03:45   Final   Culture NO GROWTH 5 DAYS   Final   Report Status 03/22/2013 FINAL   Final  CULTURE, BLOOD (ROUTINE X 2)     Status: None   Collection Time    03/15/13  8:20 PM      Result Value Range Status   Specimen Description BLOOD RIGHT ARM   Final   Special Requests BOTTLES DRAWN AEROBIC AND ANAEROBIC 10CC EACH   Final   Culture  Setup Time 03/16/2013 03:44   Final   Culture NO GROWTH 5 DAYS   Final   Report Status 03/22/2013 FINAL   Final  CULTURE, BLOOD (ROUTINE X 2)     Status: None   Collection Time    03/19/13  4:23 PM      Result Value Range Status   Specimen Description BLOOD LEFT ARM   Final   Special Requests BOTTLES DRAWN AEROBIC AND ANAEROBIC 10CC   Final   Culture  Setup Time 03/20/2013 04:07   Final   Culture     Final   Value:        BLOOD CULTURE RECEIVED NO GROWTH TO DATE CULTURE WILL BE HELD FOR 5 DAYS BEFORE ISSUING A FINAL NEGATIVE REPORT   Report Status PENDING   Incomplete  CULTURE, BLOOD (ROUTINE X 2)     Status: None   Collection Time    03/19/13  4:24 PM      Result Value Range Status   Specimen Description BLOOD LEFT ARM   Final   Special  Requests BOTTLES DRAWN AEROBIC AND ANAEROBIC 10CC   Final   Culture  Setup Time 03/20/2013 04:07   Final  Culture     Final   Value:        BLOOD CULTURE RECEIVED NO GROWTH TO DATE CULTURE WILL BE HELD FOR 5 DAYS BEFORE ISSUING A FINAL NEGATIVE REPORT   Report Status PENDING   Incomplete     Studies: No results found.  Scheduled Meds: . aspirin  81 mg Oral Daily  . cefTRIAXone (ROCEPHIN)  IV  2 g Intravenous Q12H  . clotrimazole  1 Applicatorful Vaginal QHS  . furosemide  40 mg Oral Daily  . metoprolol succinate  50 mg Oral Daily  . vancomycin  750 mg Intravenous Q12H    Principal Problem:   CAP (community acquired pneumonia) Active Problems:   Tachypnea   Leucocytosis   Undiagnosed cardiac murmurs   Acute diastolic heart failure   SVT (supraventricular tachycardia)   Infectious endocarditis   Rheumatic mitral stenosis with regurgitation    Paizlie Klaus A PA-S [facility]  03/24/2013, 10:02 AM  LOS: 9 days      Addendum  Patient seen and examined, chart and data base reviewed.  I agree with the above assessment and plan.  For full details please see Mrs. Ralene Muskrat PA-S note.   Clint Lipps, MD [facility] Pager: [phone] 03/24/2013, 10:02 AM

## 2013-03-25 DIAGNOSIS — I059 Rheumatic mitral valve disease, unspecified: Secondary | ICD-10-CM

## 2013-03-25 NOTE — Progress Notes (Signed)
CARDIAC REHAB PHASE I   PRE:  Rate/Rhythm: 78 SR  BP:  Supine:   Sitting: 80/50  Standing:    SaO2: 97 RA  MODE:  Ambulation: 1440 ft   POST:  Rate/Rhythm: 84  BP:  Supine:   Sitting: 86/50  Standing:    SaO2: 100 RA 1455-1525 Pt tolerated ambulation well without c/o of pain or SOB. BP low before and improved slightly after walk.She denies any dizziness. Pt back to recliner after walk with call light in reach,.  Melina Copa RN 03/25/2013 3:22 PM

## 2013-03-25 NOTE — Progress Notes (Addendum)
[facility] PROGRESS NOTE  Mary [name] JXB:[id number] DOB: 11-12-1960 DOA: 03/15/2013 PCP: No PCP Per Patient   HPI/Subjective: No complaints today. She feels like she is back to her baseline. Pt. Denies SOB, chest pain, abdominal pain, fever, chills, swelling in her legs.   Assessment/Plan:  Mitral Valve Regurgitation with ? Endocarditis  - 3/6 HSM at apex  - TTE on 6/20-showing severe MR with ?large vegetation  - TEE on 6/23 - showing severe MR with mild MS, cannot r/o endocarditis  - Orthopnea improved -Lasix adjusted by cardiology.  - LHC/RHC performed (6/27) - findings listed below  - blood cultures repeated (6/24) per Dr. Drue Second - NGTD, final results pending  - continue Vancomycin and Rocephin.  Patient will likely need PICC line at D/C pending ID's recommendations - To be evaluated by cardiothoracic surgery to decide about what type of intervention patient will have.  - (6/30) - systolic murmur NOT heard today. Continue with plan above - Appreciate Oral Surgery consult to assess possible oral infection that may have contributed to vegetation. (Dr. Barbette Merino)  Acute on chronic diastolic CHF  - EF normal by echo with severe MR/probably mild MS. She was volume overloaded on exam with dyspnea and orthopnea.  - Symptoms have improved.   Anemia - Pt presented with HGB 11.0 (6/20) and today has a HGB 9.4 (6/30) - Continue to monitor CBC - Consider transfusion if down-trending continues  Hypokalemia  - Serum K - 3.4 (6/26)  - Resolved - continue to monitor BMP   Supraventricular tachycardia  - Per patient, she gets episodes of palpitations very frequently at home-SVT occurred on 6/21-Successfully converted back to sinus rhythm with just 6 mg of adenosine on 6/21  - Suspect, this is not a new issue  - No repeat episodes in last 24 hours  - Now stable with Toprol XL 25 mg   Episode of Wide complex tachycardia  -Keep K above 4- stable today  -stable - no repeat episodes    Heart murmur  - from severe MR-Echo shows normal EF  - Continue with plan above   Vulvovaginal Candidiasis  - likely secondary to antibiotic use  - Was given Diflucan 6/25  - Topical antifungal started 6/28.   CAP (community acquired pneumonia)  - Afebrile, clinically better. Leukocytosis has resolved  - Urine streptococcal antigen and legionella antigen negative  - Blood culture on 6/20 negative  - Pt asymptomatic as of 6/26 - Completed 5 day course of Azithromycin  DVT Prophylaxis:  Prophylactic Lovenox    Code Status: Full Family Communication: None at bedside  Disposition Plan: Inpatient - if there is a delay in mitral valve repair, perhaps can D/C in the interim.   Consultants:  Cardiology  Dentist  Infectious Disease  Procedures: Procedure Performed: (6/28) 1. Left Heart Catheterization 2. Selective Coronary Angiography 3. Right Heart Catheterization 4. Left ventricular angiogram 5. Distal Aortogram  Impression:   1. No angiographic evidence of CAD   2. Normal LV function.   3. Severe MR   Antibiotics:  Azithromax 500 mg po - 6/20 >> 6/24   Ceftroaxone 2 g IV - 6/20 >>   Vancomycin 750 mg/150 ml IV - 6/22 >>   Objective: Filed Vitals:   03/24/13 2157 03/24/13 2301 03/25/13 0417 03/25/13 0434  BP: 85/52 97/60 92/56    Pulse: 80 82 82   Temp: 97.7 F (36.5 C)  98.4 F (36.9 C)   TempSrc: Oral  Oral   Resp: 18  18  Height:      Weight:    49.3 kg (108 lb 11 oz)  SpO2: 100%  100%     Intake/Output Summary (Last 24 hours) at 03/25/13 1225 Last data filed at 03/25/13 0930  Gross per 24 hour  Intake    943 ml  Output   1250 ml  Net   -307 ml   Filed Weights   03/23/13 0436 03/24/13 0618 03/25/13 0434  Weight: 49.2 kg (108 lb 7.5 oz) 49.7 kg (109 lb 9.1 oz) 49.3 kg (108 lb 11 oz)    Exam:   General:  A & Ox3, NAD, sitting in chair eating breakfast  Cardiovascular: RRR, no systolic murmur, no g/r  Respiratory: CTA bilaterally,  no wheezes, rales, or rhonchi  Abdomen: +BS, soft, NT, ND, no masses or organomegaly  Musculoskeletal: no peripheral edema, pt has a normal gait and ambulates without difficulty/assistance   Data Reviewed: Basic Metabolic Panel:  Recent Labs Lab 03/19/13 1812 03/20/13 0530 03/21/13 0545 03/22/13 0455 03/23/13 0430  NA 132* 135 135 136 137  K 4.5 4.3 3.4* 4.2 4.3  CL 99 102 100 102 104  CO2 23 25 26 23 25   GLUCOSE 100* 83 91 101* 90  BUN 4* 4* 7 9 9   CREATININE 0.68 0.72 0.74 0.83 0.86  CALCIUM 9.6 9.1 9.1 9.2 9.1  MG  --   --   --  2.1  --    CBC:  Recent Labs Lab 03/19/13 1812 03/21/13 0545 03/22/13 0455 03/23/13 0430  WBC 9.7 7.6 7.0 6.2  NEUTROABS 7.6  --   --   --   HGB 10.7* 9.8* 9.7* 9.4*  HCT 30.5* 27.9* 27.7* 27.7*  MCV 91.3 89.7 89.6 90.5  PLT 502* 508* 537* 571*   BNP (last 3 results)  Recent Labs  03/15/13 1502 03/15/13 2023  PROBNP 4891.0* 4536.0*     Recent Results (from the past 240 hour(s))  CULTURE, BLOOD (ROUTINE X 2)     Status: None   Collection Time    03/15/13  8:10 PM      Result Value Range Status   Specimen Description BLOOD RIGHT ARM   Final   Special Requests BOTTLES DRAWN AEROBIC AND ANAEROBIC 10CC EACH   Final   Culture  Setup Time 03/16/2013 03:45   Final   Culture NO GROWTH 5 DAYS   Final   Report Status 03/22/2013 FINAL   Final  CULTURE, BLOOD (ROUTINE X 2)     Status: None   Collection Time    03/15/13  8:20 PM      Result Value Range Status   Specimen Description BLOOD RIGHT ARM   Final   Special Requests BOTTLES DRAWN AEROBIC AND ANAEROBIC 10CC EACH   Final   Culture  Setup Time 03/16/2013 03:44   Final   Culture NO GROWTH 5 DAYS   Final   Report Status 03/22/2013 FINAL   Final  CULTURE, BLOOD (ROUTINE X 2)     Status: None   Collection Time    03/19/13  4:23 PM      Result Value Range Status   Specimen Description BLOOD LEFT ARM   Final   Special Requests BOTTLES DRAWN AEROBIC AND ANAEROBIC 10CC   Final    Culture  Setup Time 03/20/2013 04:07   Final   Culture     Final   Value:        BLOOD CULTURE RECEIVED NO GROWTH TO DATE CULTURE WILL BE HELD  FOR 5 DAYS BEFORE ISSUING A FINAL NEGATIVE REPORT   Report Status PENDING   Incomplete  CULTURE, BLOOD (ROUTINE X 2)     Status: None   Collection Time    03/19/13  4:24 PM      Result Value Range Status   Specimen Description BLOOD LEFT ARM   Final   Special Requests BOTTLES DRAWN AEROBIC AND ANAEROBIC 10CC   Final   Culture  Setup Time 03/20/2013 04:07   Final   Culture     Final   Value:        BLOOD CULTURE RECEIVED NO GROWTH TO DATE CULTURE WILL BE HELD FOR 5 DAYS BEFORE ISSUING A FINAL NEGATIVE REPORT   Report Status PENDING   Incomplete     Studies: No results found.  Scheduled Meds: . aspirin  81 mg Oral Daily  . cefTRIAXone (ROCEPHIN)  IV  2 g Intravenous Q12H  . clotrimazole  1 Applicatorful Vaginal QHS  . furosemide  40 mg Oral Daily  . metoprolol succinate  50 mg Oral Daily  . sodium chloride  3 mL Intravenous Q12H  . vancomycin  750 mg Intravenous Q12H   Continuous Infusions:   Principal Problem:   CAP (community acquired pneumonia) Active Problems:   Tachypnea   Leucocytosis   Undiagnosed cardiac murmurs   Acute diastolic heart failure   SVT (supraventricular tachycardia)   Infectious endocarditis   Rheumatic mitral stenosis with regurgitation    Ralene Muskrat, PA-S Algis Downs, PA-C [facility]  03/25/2013, 12:25 PM  LOS: 10 days      Addendum  Patient seen and examined, chart and data base reviewed.  I agree with the above assessment and plan.  For full details please see Mrs. Algis Downs PA note.   Clint Lipps, MD [facility] Pager: [phone] 03/25/2013, 2:16 PM

## 2013-03-25 NOTE — Progress Notes (Addendum)
[address]       Mary [name] [id number]             [phone]     CARDIOTHORACIC SURGERY PROGRESS NOTE  3 Days Post-Op  S/P Procedure(s) (LRB): LEFT AND RIGHT HEART CATHETERIZATION WITH CORONARY ANGIOGRAM (N/A)  Subjective: Patient reports feeling quite well, essentially back to her baseline  Objective: Vital signs in last 24 hours: Temp:  [97.7 F (36.5 C)-98.4 F (36.9 C)] 97.9 F (36.6 C) (06/30 1423) Pulse Rate:  [76-82] 76 (06/30 1423) Cardiac Rhythm:  [-] Normal sinus rhythm (06/30 0702) Resp:  [18] 18 (06/30 1423) BP: (85-97)/(52-60) 85/59 mmHg (06/30 1423) SpO2:  [100 %] 100 % (06/30 1423) Weight:  [49.3 kg (108 lb 11 oz)] 49.3 kg (108 lb 11 oz) (06/30 0434)  Physical Exam:  Breath sounds: Fairly clear  Heart sounds:  RRR  Incisions:  n/a  Abdomen:  soft  Extremities:  warm   Intake/Output from previous day: 06/29 0701 - 06/30 0700 In: 643 [P.O.:240; I.V.:3; IV Piggyback:400] Out: 1250 [Urine:1250] Intake/Output this shift:    Lab Results:  Recent Labs  03/23/13 0430  WBC 6.2  HGB 9.4*  HCT 27.7*  PLT 571*   BMET:  Recent Labs  03/23/13 0430  NA 137  K 4.3  CL 104  CO2 25  GLUCOSE 90  BUN 9  CREATININE 0.86  CALCIUM 9.1    CBG (last 3)  No results found for this basename: GLUCAP,  in the last 72 hours PT/INR:  No results found for this basename: LABPROT, INR,  in the last 72 hours  CXR:  N/A  Assessment/Plan: S/P Procedure(s) (LRB): LEFT AND RIGHT HEART CATHETERIZATION WITH CORONARY ANGIOGRAM (N/A)  I have reviewed Mary [name]'s history, labs, transthoracic and transesophageal echocardiograms, and cath and all culture results.  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  She also has moderate tricuspid regurgitation, and she has had some non-sustained runs of SVT.  At the time of her presentation she had  acute systolic congestive heart failure with pulmonary edema, but these have responded nicely to medical therapy.  At this point the diagnosis of bacterial endocarditis remains presumptive, as all cultures have remained no growth to date (although reportedly the first set were obtained after the patient had artery been given antibiotics).  Although the transthoracic echocardiogram was more suggestive of endocarditis, a vegetation was not clearly seen on transesophageal echocardiogram.  The transesophageal echocardiogram did not reveal signs of leaflet perforation nor other complications which might lead to rapid onset or progression of congestive heart failure.  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  I don't think there's any question that she will need to undergo surgical intervention for management of severe mitral regurgitation, but at this point the real question is timing of surgery.  I am skeptical that her valve will be repairable and I suspect that valve replacement using a mechanical prosthesis will be her best option given her relatively young age. She appears to be a good candidate for minimally invasive approach for surgery.   At this point I would favor proceeding with dental cleaning and extraction as soon as practical, after which time we allow the patient to complete her course of antibiotics prior to proceeding with elective  mitral valve repair or replacement.  Will continue to follow closely.   Mary [name] 03/25/2013 7:30 PM

## 2013-03-25 NOTE — Progress Notes (Signed)
    SUBJECTIVE: No complaints.   BP 92/56  Pulse 82  Temp(Src) 98.4 F (36.9 C) (Oral)  Resp 18  Ht 5\' 2"  (1.575 m)  Wt 108 lb 11 oz (49.3 kg)  BMI 19.87 kg/m2  SpO2 100%  LMP 02/19/2013  Intake/Output Summary (Last 24 hours) at 03/25/13 0749 Last data filed at 03/25/13 0428  Gross per 24 hour  Intake    643 ml  Output   1250 ml  Net   -607 ml    PHYSICAL EXAM General: Well developed, well nourished, in no acute distress. Alert and oriented x 3.  Psych:  Good affect, responds appropriately Neck: No JVD. No masses noted.  Lungs: Clear bilaterally with no wheezes or rhonci noted.  Heart: RRR with no systolic murmur noted. Abdomen: Bowel sounds are present. Soft, non-tender.  Extremities: No lower extremity edema.   LABS: Basic Metabolic Panel:  Recent Labs  16/10/96 0430  NA 137  K 4.3  CL 104  CO2 25  GLUCOSE 90  BUN 9  CREATININE 0.86  CALCIUM 9.1   CBC:  Recent Labs  03/23/13 0430  WBC 6.2  HGB 9.4*  HCT 27.7*  MCV 90.5  PLT 571*   Current Meds: . aspirin  81 mg Oral Daily  . cefTRIAXone (ROCEPHIN)  IV  2 g Intravenous Q12H  . clotrimazole  1 Applicatorful Vaginal QHS  . furosemide  40 mg Oral Daily  . metoprolol succinate  50 mg Oral Daily  . sodium chloride  3 mL Intravenous Q12H  . vancomycin  750 mg Intravenous Q12H     ASSESSMENT AND PLAN:   1. 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 No significant coronary artery disease by cath 03/22/13.  2. Treating emperically for possible endocarditis, cultures negative. ID following.   3. Acute diastolic CHF secondary to valvular heart disease. 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   MCALHANY,CHRISTOPHER  6/30/20147:49 AM

## 2013-03-25 NOTE — Consult Note (Signed)
Reason for Consult: Pending Mitral valve replacement   Mary [name] is an 52 y.o. female.  CC:No dental pain   HPI: Last dental visit 2011. No pain, swelling, or abscesses.  Past Medical History  Diagnosis Date  . Heart murmur   . CAP (community acquired pneumonia) 03/15/2013    "maybe; that's what they are thinking today" (03/15/2013)  . Asthma     "as a baby"   . Orthopnoea 03/15/2013    "in the last 2 days" (03/15/2013)  . Anemia   . History of blood transfusion     "14 w/1st pregnancy; 2 w/last C-section" (03/15/2013)  . GERD (gastroesophageal reflux disease)     Past Surgical History  Procedure Laterality Date  . Appendectomy  ~1978  . Cesarean section  1983; 1999  . Cholecystectomy  2000's  . Tubal ligation  1999  . Tee without cardioversion N/A 03/18/2013    Procedure: TRANSESOPHAGEAL ECHOCARDIOGRAM (TEE);  Surgeon: Laurey Morale, MD;  Location[facility] ENDOSCOPY;  Service: Cardiovascular;  Laterality: N/A;    History reviewed. No pertinent family history.  Social History:  reports that she has been smoking Cigarettes.  She has a 18 pack-year smoking history. She has never used smokeless tobacco. She reports that she drinks about 3.6 ounces of alcohol per week. She reports that she does not use illicit drugs.  Allergies: No Known Allergies  Medications: I have reviewed the patient's current medications.  No results found for this or any previous visit (from the past 48 hour(s)).  No results found.  @ROS @ Blood pressure 92/56, pulse 82, temperature 98.4 F (36.9 C), temperature source Oral, resp. rate 18, height 5\' 2"  (1.575 [name]), weight 49.3 kg (108 lb 11 oz), last menstrual period 02/19/2013, SpO2 100.00%. General appearance: alert, cooperative and no distress Head: Normocephalic, without obvious abnormality, atraumatic Eyes: negative Ears: normal TM's and external ear canals both ears Nose: Nares normal. Septum midline. Mucosa normal. No drainage or sinus  tenderness. Throat: Super-erupted tooth #2, Deep caries teeth #'s 8, 9, 13. Pharynx clear. No masses or lesions. Neck: no adenopathy, no JVD and supple, symmetrical, trachea midline  Assessment/Plan: Limited oral surgery exam bedside. Non-restorable teeth #'s 2, 8, 9, 13. Will discuss with [name] DDS regarding periodontal exam and evaluation for dental caries.   Mary [name] 03/25/2013, 11:14 AM

## 2013-03-26 ENCOUNTER — Ambulatory Visit (HOSPITAL_COMMUNITY): Payer: Self-pay | Admitting: Dentistry

## 2013-03-26 ENCOUNTER — Encounter (HOSPITAL_COMMUNITY): Payer: Self-pay | Admitting: Dentistry

## 2013-03-26 VITALS — BP 88/54 | HR 78 | Temp 98.6°F

## 2013-03-26 DIAGNOSIS — K029 Dental caries, unspecified: Secondary | ICD-10-CM

## 2013-03-26 DIAGNOSIS — M264 Malocclusion, unspecified: Secondary | ICD-10-CM

## 2013-03-26 DIAGNOSIS — K0530 Chronic periodontitis, unspecified: Secondary | ICD-10-CM

## 2013-03-26 DIAGNOSIS — K036 Deposits [accretions] on teeth: Secondary | ICD-10-CM

## 2013-03-26 DIAGNOSIS — I340 Nonrheumatic mitral (valve) insufficiency: Secondary | ICD-10-CM

## 2013-03-26 DIAGNOSIS — K08409 Partial loss of teeth, unspecified cause, unspecified class: Secondary | ICD-10-CM

## 2013-03-26 DIAGNOSIS — I38 Endocarditis, valve unspecified: Secondary | ICD-10-CM

## 2013-03-26 DIAGNOSIS — Z954 Presence of other heart-valve replacement: Secondary | ICD-10-CM

## 2013-03-26 DIAGNOSIS — K0889 Other specified disorders of teeth and supporting structures: Secondary | ICD-10-CM

## 2013-03-26 DIAGNOSIS — Z0189 Encounter for other specified special examinations: Secondary | ICD-10-CM

## 2013-03-26 DIAGNOSIS — I059 Rheumatic mitral valve disease, unspecified: Secondary | ICD-10-CM

## 2013-03-26 LAB — CULTURE, BLOOD (ROUTINE X 2): Culture: NO GROWTH

## 2013-03-26 MED ORDER — SODIUM CHLORIDE 0.9 % IJ SOLN
10.0000 mL | INTRAMUSCULAR | Status: DC | PRN
  Administered 2013-03-26: 10 mL

## 2013-03-26 MED ORDER — METOPROLOL SUCCINATE ER 50 MG PO TB24: 50.0000 mg | ORAL_TABLET | Freq: Every day | ORAL | Status: DC

## 2013-03-26 MED ORDER — DEXTROSE 5 % IV SOLN: 2.0000 g | Freq: Two times a day (BID) | INTRAVENOUS | Status: AC

## 2013-03-26 MED ORDER — VANCOMYCIN HCL IN DEXTROSE 750-5 MG/150ML-% IV SOLN: 750.0000 mg | Freq: Two times a day (BID) | INTRAVENOUS | Status: DC

## 2013-03-26 MED ORDER — ASPIRIN 81 MG PO CHEW: 81.0000 mg | CHEWABLE_TABLET | Freq: Every day | ORAL | Status: DC

## 2013-03-26 MED ORDER — FUROSEMIDE 40 MG PO TABS: 40.0000 mg | ORAL_TABLET | Freq: Every day | ORAL | Status: DC

## 2013-03-26 MED ORDER — HEPARIN SOD (PORK) LOCK FLUSH 100 UNIT/ML IV SOLN
250.0000 [IU] | INTRAVENOUS | Status: AC | PRN
  Administered 2013-03-26: 250 [IU]

## 2013-03-26 MED ORDER — CLOTRIMAZOLE 1 % VA CREA: 1.0000 | TOPICAL_CREAM | Freq: Every day | VAGINAL | Status: DC

## 2013-03-26 NOTE — Progress Notes (Signed)
PICC line capped off, flushed with 10cc NS, GBR, followed by Heparin 250 units. [name] M   

## 2013-03-26 NOTE — Progress Notes (Signed)
DENTAL CONSULTATION  Date of Consultation:  03/26/2013 Patient Name:   Mary [name] Date of Birth:   [birth year] Medical Record Number: [id number]  VITALS: BP 88/54  Pulse 78  Temp(Src) 98.6 F (37 C) (Oral)  LMP 02/19/2013   CHIEF COMPLAINT: Patient was referred for a pre-heart valve surgery dental protocol evaluation.  HPI: Mary [name] is a 52 year old female recently diagnosed with endocarditis and severe mitral regurgitation. Patient with anticipated mitral valve replacement with Dr. Cornelius Moras. Patient now seen as part of a pre-heart valve surgery dental protocol examination.  Patient currently denies acute toothaches, abscesses, or toothache. The patient does have a history of some dental pain associated with tooth #8 and #14 in the past.  Patient last saw a dentist in 2011. This was with a Education officer, community on [employer] in [place].  Patient denies having any partial dentures. Patient has no crowns. Patient had a root canal therapy performed on tooth #9 when she was 52 years of age. Patient is NOT seen by a dentist on a regular basis. 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  Patient Active Problem List   Diagnosis Date Noted  . 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 SVT (supraventricular tachycardia) 03/16/2013  . Tachypnea 03/15/2013  . Leucocytosis 03/15/2013  . CAP (community acquired pneumonia) 03/15/2013  . Undiagnosed cardiac murmurs 03/15/2013  . Acute diastolic heart failure 03/15/2013     PMH: Past Medical History  Diagnosis Date  . Heart murmur   . CAP (community acquired pneumonia) 03/15/2013    "maybe; that's what they are thinking today" (03/15/2013)  . Asthma     "as a baby"   . Orthopnoea 03/15/2013    "in the last 2 days" (03/15/2013)  . Anemia   . History of blood transfusion     "14 w/1st pregnancy; 2 w/last C-section" (03/15/2013)  . GERD (gastroesophageal reflux  disease)     PSH: Past Surgical History  Procedure Laterality Date  . Appendectomy  ~1978  . Cesarean section  1983; 1999  . Cholecystectomy  2000's  . Tubal ligation  1999  . Tee without cardioversion N/A 03/18/2013    Procedure: TRANSESOPHAGEAL ECHOCARDIOGRAM (TEE);  Surgeon: Laurey Morale, MD;  Location: [facility] ENDOSCOPY;  Service: Cardiovascular;  Laterality: N/A;    ALLERGIES: No Known Allergies  MEDICATIONS: Current Outpatient Prescriptions  Medication Sig Dispense Refill  . aspirin 81 MG chewable tablet Chew 1 tablet (81 mg total) by mouth daily.      . clotrimazole (GYNE-LOTRIMIN) 1 % vaginal cream Place 1 Applicatorful vaginally at bedtime.  45 g  0  . dextrose 5 % SOLN 50 mL with cefTRIAXone 2 G SOLR 2 g Inject 2 g into the vein every 12 (twelve) hours.  90 g  0  . furosemide (LASIX) 40 MG tablet Take 1 tablet (40 mg total) by mouth daily.  30 tablet  3  . metoprolol succinate (TOPROL-XL) 50 MG 24 hr tablet Take 1 tablet (50 mg total) by mouth daily. Take with or immediately following a meal.  30 tablet  3  . Vancomycin (VANCOCIN) 750 MG/150ML SOLN Inject 150 mLs (750 mg total) into the vein every 12 (twelve) hours.  6750 mL  0   No current facility-administered medications for this visit.    LABS: Lab Results  Component Value Date   WBC 6.2 03/23/2013   HGB 9.4* 03/23/2013   HCT 27.7* 03/23/2013   MCV  90.5 03/23/2013   PLT 571* 03/23/2013      Component Value Date/Time   NA 137 03/23/2013 0430   K 4.3 03/23/2013 0430   CL 104 03/23/2013 0430   CO2 25 03/23/2013 0430   GLUCOSE 90 03/23/2013 0430   BUN 9 03/23/2013 0430   CREATININE 0.86 03/23/2013 0430   CALCIUM 9.1 03/23/2013 0430   GFRNONAA 77* 03/23/2013 0430   GFRAA 90* 03/23/2013 0430   Lab Results  Component Value Date   INR 0.97 03/22/2013   INR 1.06 08/23/2010   No results found for this basename: PTT    SOCIAL HISTORY: History   Social History  . Marital Status: Single    Spouse Name: N/A    Number of  Children: N/A  . Years of Education: N/A   Occupational History  . Not on file.   Social History Main Topics  . Smoking status: Current Every Day Smoker -- 0.50 packs/day for 36 years    Types: Cigarettes  . Smokeless tobacco: Never Used  . Alcohol Use: 3.6 oz/week    6 Glasses of wine per week     Comment: 03/15/2013 "bottle of wine/wk"  . Drug Use: No  . Sexually Active: No   Other Topics Concern  . Not on file   Social History Narrative  . No narrative on file    FAMILY HISTORY: No family history on file.   REVIEW OF SYSTEMS: Reviewed with the patient and included in dental record. Review of systems was also reviewed from previous hospitalization records.  DENTAL HISTORY: CHIEF COMPLAINT: Patient was referred for a pre-heart valve surgery dental protocol evaluation.  HPI: Mary [name] is a 53 year old female recently diagnosed with endocarditis and severe mitral regurgitation. Patient with anticipated mitral valve replacement with Dr. Cornelius Moras. Patient now seen as part of a pre-heart valve surgery dental protocol examination.  Patient currently denies acute toothaches, abscesses, or toothache. The patient does have a history of some dental pain associated with tooth #8 and #14 in the past.  Patient last saw a dentist in 2011. This was with a Education officer, community on [employer] in [place].  Patient denies having any partial dentures. Patient has no crowns. Patient had a root canal therapy performed on tooth #9 when she was 52 years of age. Patient is NOT seen by a dentist on a regular basis. 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   DENTAL EXAMINATION:  GENERAL: Patient well-developed, well-nourished female in no acute distress. HEAD AND NECK: There is no palpable submandibular lymphadenopathy. The patient denies acute TMJ symptoms. INTRAORAL EXAM: The patient has normal saliva. There is no evidence of abscess formation at this time. The patient has a  small mandibular right lingual torus. DENTITION: Patient has multiple missing teeth. There is supra-eruption and drifting of the unopposed teeth into the edentulous areas. PERIODONTAL: The patient has chronic periodontitis with plaque and calculus accumulations, gingival recession, and tooth mobility as charted. DENTAL CARIES/SUBOPTIMAL RESTORATIONS: The patient has multiple dental caries as charted. The patient appears to have extensive dental caries associated with tooth #8 that is impinging on the pulp contents. Tooth #9 has a fractured suboptimal restoration and tooth mobility that makes this tooth nonrestorable. ENDODONTIC: Patient has had previous root canal therapy associated with tooth #9. No other obvious periapical pathology is noted. CROWN AND BRIDGE: No crown or bridge restorations are noted. PROSTHODONTIC: The patient has no partial dentures but may need future partial denture fabrication by the  dentist of her choice after adequate healing from the anticipated extractions. OCCLUSION: Patient with a poor occlusal scheme secondary to multiple missing teeth, supra-eruption and drifting of the unopposed teeth into the edentulous areas, and lack of replacement of missing teeth with dental prostheses.  RADIOGRAPHIC INTERPRETATION: An orthopantogram was obtained and supplemented with a full series of dental radiographs.  There are multiple missing teeth. There are multiple dental caries noted.  There is supra-eruption and drifting of the unopposed teeth into the edentulous areas. There is incipient to moderate bone loss noted. Radiographic calculus is noted. Significant dental caries associated with tooth #8 may be impinging on the pulp. There is a previous root canal therapy associated with tooth #9 noted.   ASSESSMENTS: 1. Chronic periodontitis bone loss 2. Gingival recession 3. Significant plaque and calculus accumulations 4. Tooth mobility 5. Multiple dental caries 6. Suboptimal dental  restorations 7. Supra-eruption and drifting of the unopposed teeth into the edentulous areas 8. Multiple missing teeth 9. Malocclusion  10. Dental phobia  PLAN/RECOMMENDATIONS: 1. I discussed the risks, benefits, and complications of various treatment options with the patient in relationship to her medical and dental conditions, anticipated heart valve surgery, and current endocarditis. We discussed various treatment options to include no treatment, multiple extractions with alveoloplasty, pre-prosthetic surgery as indicated, periodontal therapy, dental restorations, root canal therapy, crown and bridge therapy, implant therapy, and replacement of missing teeth as indicated. We discussed referral for root canal therapy or crown or bridge therapy if patient so desires.  The patient currently wishes to proceed with multiple extractions with alveoloplasty and pre-prosthetic surgery as indicated along with gross debridement of remaining dentition in the operating room on 04/04/2013 at 9:15 AM at [facility].  The patient will then follow up with Dental Medicine for other dental restorations, as time permits, prior to the anticipated heart valve surgery.  After the heart valve surgery, the patient will followup with a dentist of her choice for routine dental care, and evaluation for replacement of missing teeth is indicated after adequate healing and once medically stable from a heart valve surgery.  The patient will need evaluation for antibiotic premedication prior to invasive dental procedures as per American Heart Association guidelines in the future.   2. Discussion of findings with medical team and coordination of future medical and dental care.   Charlynne Pander, DDS

## 2013-03-26 NOTE — Progress Notes (Signed)
Peripherally Inserted Central Catheter/Midline Placement  The IV Nurse has discussed with the patient and/or persons authorized to consent for the patient, the purpose of this procedure and the potential benefits and risks involved with this procedure.  The benefits include less needle sticks, lab draws from the catheter and patient may be discharged home with the catheter.  Risks include, but not limited to, infection, bleeding, blood clot (thrombus formation), and puncture of an artery; nerve damage and irregular heat beat.  Alternatives to this procedure were also discussed. [id number]  PICC/Midline Placement Documentation        Mellissa Kohut 03/26/2013, 8:40 AM

## 2013-03-26 NOTE — Care Management Note (Signed)
    Page 1 of 2   03/26/2013     3:23:40 PM   CARE MANAGEMENT NOTE 03/26/2013  Mary [name]:  Mary Mary [name], Mary Mary [name]   Account Number:  [id number]  Date Initiated:  03/18/2013  Documentation initiated by:  Mary [name]  Subjective/Objective Assessment:   CAP     Action/Plan:   lives alone, no insurance coverage   Anticipated DC Date:  03/26/2013   Anticipated DC Plan:  HOME W HOME HEALTH SERVICES      DC Planning Services  CM consult      [facility] Choice  HOME HEALTH   Choice offered to / List presented to:  Mary Mary [name]   DME arranged  IV PUMP/EQUIPMENT      DME agency  [facility]     [facility] arranged  HH-1 RN      [facility] agency  [facility]   Status of service:  Completed, signed off Medicare Important Message given?   (If response is "NO", the following Medicare IM given date fields will be blank) Date Medicare IM given:   Date Additional Medicare IM given:    Discharge Disposition:  HOME W HOME HEALTH SERVICES  Per UR Regulation:  Reviewed for med. necessity/level of care/duration of stay  If discussed at Long Length of Stay Meetings, dates discussed:    Comments:  03/26/13 15:18 Mary Cape RN, BSN [phone] Mary [name] is for dc today, Mary Mary [name] has Mary [name] dental  appt with Dr. Kristin [name] at [facility] today at 2 pm , which Mary Mary [name] will be transported by cab.  Mary [name] states Mary Mary [name] did not want to use the Match Program on this admission , most of her meds will be $4 at [facility] , Mary Mary [name] will only get two of the metoprolol until Mary Mary [name] get paid on THursday and then Mary Mary [name] will get rest of that script. I also gave Mary [name] Mary [name] scale to weigh her self daily.  Mary [name] chose [facility] for [facility] for IV ABX, referral made to [facility] , Mary [name] notified.  Soc will begin 24-48 hrs post discharge.   03/18/2013 1100 NCM spoke to pt and Mary Mary [name] does not have any type of drug or insurance coverage. Explained MATCH and that program can used once per year with Mary [name] $3.00 copay. States Mary Mary [name] is only working part-time. Will arrange appt  with [facility]. Mary Donning RN CCM Case Mgmt phone [phone]

## 2013-03-26 NOTE — Progress Notes (Signed)
NURSING PROGRESS NOTE  [name] [id number] Discharge Data: 03/26/2013 1:21 PM Attending Provider: Maretta Bees, MD PCP:No PCP Per Patient     Mary [name] to be D/C'd Home per MD order.    All IV's discontinued with no bleeding noted.  All belongings returned to patient for patient to take home.   Last Vital Signs:  Blood pressure 94/48, pulse 80, temperature 98.2 F (36.8 C), temperature source Oral, resp. rate 18, height 5\' 2"  (1.575 m), weight 49.2 kg (108 lb 7.5 oz), last menstrual period 02/19/2013, SpO2 100.00%.  Discharge Medication List   Medication List         aspirin 81 MG chewable tablet  Chew 1 tablet (81 mg total) by mouth daily.     clotrimazole 1 % vaginal cream  Commonly known as:  GYNE-LOTRIMIN  Place 1 Applicatorful vaginally at bedtime.     dextrose 5 % SOLN 50 mL with cefTRIAXone 2 G SOLR 2 g  Inject 2 g into the vein every 12 (twelve) hours.     furosemide 40 MG tablet  Commonly known as:  LASIX  Take 1 tablet (40 mg total) by mouth daily.     metoprolol succinate 50 MG 24 hr tablet  Commonly known as:  TOPROL-XL  Take 1 tablet (50 mg total) by mouth daily. Take with or immediately following a meal.     Vancomycin 750 MG/150ML Soln  Commonly known as:  VANCOCIN  Inject 150 mLs (750 mg total) into the vein every 12 (twelve) hours.        Madelin Rear, MSN, RN, [employer]

## 2013-03-26 NOTE — Discharge Summary (Signed)
Physician Discharge Summary  Mary [name] RUE:[id number] DOB: 08-Apr-1961 DOA: 03/15/2013  PCP: No PCP Per Patient  Admit date: 03/15/2013 Discharge date: 03/26/2013  Time spent: 60 minutes  Recommendations for Outpatient Follow-up:   Dental evaluation, cleaning and extractions to be done today 7/1  Continue on Vancomycin and Rocephin until 7/23  Will follow in the ID clinic with Dr. Drue Second on 7/17.  Home Health RN for PICC care, appropriate blood draws and medication monitoring.  Discharge Diagnoses:  Principal Problem:   CAP (community acquired pneumonia) Active Problems:   Tachypnea   Leucocytosis   Undiagnosed cardiac murmurs   Acute diastolic heart failure   SVT (supraventricular tachycardia)   Infectious endocarditis   Rheumatic mitral stenosis with regurgitation   Discharge Condition: stable, appears well  Diet recommendation: Heart Healthy diet  Filed Weights   03/24/13 0618 03/25/13 0434 03/26/13 0553  Weight: 49.7 kg (109 lb 9.1 oz) 49.3 kg (108 lb 11 oz) 49.2 kg (108 lb 7.5 oz)    History of present illness:  Mary [name] is a 52 y.o. BF PMHx asthma as a child, no current history, on no medication. Presented to ED positive today's of fever at home measured at 102, positive increasing shortness of breath, positive dry cough, negative chest pain, positive bilateral lower extremity edema to knees which resolved after patient elevate feet over last 2 days negative sick contacts, negative travel outside the [place] has cardiac murmur since childhood which has never been evaluated. presents with fever, cough, and SOB. Symptoms have been present for the past 3 days and are gradually worsening. She reports that her cough is non productive. She has also had some associated nasal congestion. She has not taken anything for symptoms prior to arrival. Patient denies any chest pain.  She was admitted to the [facility] service for community acquired pneumonia and acute onset of  CHF.  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  - 2 D Echo on 6/20-showing severe MR with a question of large vegetation  - Transesophageal Echo on 6/23 - showing severe MR with mild MS, cannot r/o endocarditis.  Dr. Marca Ancona - Orthopnea improved -Lasix adjusted by cardiology.  - LHC/RHC performed (6/27) by Dr. Verne Carrow - findings listed below  - blood cultures repeated (6/24) per Dr. Drue Second -No growth.   - Continue Vancomycin and Rocephin for a minimum of 4 weeks.  The patient will be followed in the ID clinic. - Cardio Thoracic Surgery (Drs Laneta Simmers and Cornelius Moras) recommend dental cleaning and extraction, completion of antibiotics, then mitral valve repair or replacement.   - Appreciate Oral Surgery consult to assess possible oral infection that may have contributed to vegetation. (Dr. Barbette Merino) .  - [name] will see Dr. Kristin Bruins for cleaning and extractions today after discharge. (7/1)  Supraventricular tachycardia (AVNRT) - Per patient, she gets episodes of palpitations very frequently at home-SVT occurred on 6/21-Successfully converted back to sinus rhythm with just 6 mg of adenosine on 6/21  - Suspect, this is not a new issue  - No repeat episodes since 6/21. - Now stable with Toprol XL 25 mg   Acute on chronic diastolic CHF  - EF normal by echo with severe MR/probably mild MS. -RHC 6/27 showed PCWP 12, PA mean 34 - She was volume overloaded on exam earlier in the hospitalization with dyspnea and orthopnea.  - Symptoms  have resolved.  She will be maintained on Metoprolol and Lasix 40 mg daily.  CAP (community acquired pneumonia)  - Afebrile, clinically better. Leukocytosis has resolved  - Urine streptococcal antigen and legionella antigen negative  - Blood culture on 6/20 negative  - Pt asymptomatic as of 6/26 - Completed 5 day course of  Azithromycin   Anemia  - Pt presented with HGB 11.0 has trailed down to HGB 9.4 over the course of the hospitalization - Continue to monitor CBC in outpatient follow up.  Hypokalemia  - Repleted and resolved - continue to monitor BMP outpatient  Supraventricular tachycardia  - Per patient, she gets episodes of palpitations very frequently at home-SVT occurred on 6/21-Successfully converted back to sinus rhythm with just 6 mg of adenosine on 6/21  - Suspect, this is not a new issue  - No repeat episodes since 6/21. - Now stable with Toprol XL 25 mg   Episode of Wide complex tachycardia  -stable - no repeat episodes  -potassium was maintained above 4.0  Vulvovaginal Candidiasis  - likely secondary to antibiotic use  - Was given Diflucan 6/25  - Topical antifungal started 6/28.   Procedures:  Trans esophageal echo-cardiogram  Left and Right heart catheterization   Consultations:  Cardiology  Infectious Disease  Oral Surgery  Cardio-thoracic Surgery  Discharge Exam: Filed Vitals:   03/25/13 1423 03/25/13 2127 03/26/13 0553 03/26/13 1044  BP: 85/59 100/47  94/48  Pulse: 76 79  80  Temp: 97.9 F (36.6 C) 98.2 F (36.8 C)    TempSrc: Oral Oral    Resp: 18 18    Height:      Weight:   49.2 kg (108 lb 7.5 oz)   SpO2: 100% 100%     General: A & Ox3, NAD, sitting in chair eating breakfast.  Appears well Cardiovascular: RRR, no systolic murmur, no g/r  Respiratory: CTA bilaterally, no wheezes, rales, or rhonchi  Abdomen: +BS, soft, NT, ND, no masses or organomegaly  Musculoskeletal: no peripheral edema, pt has a normal gait and ambulates without difficulty/assistance   Discharge Instructions      Discharge Orders   Future Appointments Provider Department Dept Phone   04/11/2013 11:15 AM Judyann Munson, MD [facility] [phone]   Future Orders Complete By Expires     Diet - low sodium heart healthy  As directed      Increase activity slowly  As directed         Medication List         aspirin 81 MG chewable tablet  Chew 1 tablet (81 mg total) by mouth daily.     clotrimazole 1 % vaginal cream  Commonly known as:  GYNE-LOTRIMIN  Place 1 Applicatorful vaginally at bedtime.     dextrose 5 % SOLN 50 mL with cefTRIAXone 2 G SOLR 2 g  Inject 2 g into the vein every 12 (twelve) hours.     furosemide 40 MG tablet  Commonly known as:  LASIX  Take 1 tablet (40 mg total) by mouth daily.     metoprolol succinate 50 MG 24 hr tablet  Commonly known as:  TOPROL-XL  Take 1 tablet (50 mg total) by mouth daily. Take with or immediately following a meal.     Vancomycin 750 MG/150ML Soln  Commonly known as:  VANCOCIN  Inject 150 mLs (750 mg total) into the vein every 12 (twelve) hours.       No Known Allergies Follow-up  Information   Follow up with Judyann Munson, MD On 04/11/2013. (11:15)    Contact information:   [address] [place] [address] [phone]       Follow up with Purcell Nails, MD. Schedule an appointment as soon as possible for a visit in 1 month.   Contact information:   [address] [employer] [address] [place] [address] [phone]       Follow up with Charlynne Pander, DDS On 03/26/2013. (2 pm)    Contact information:   [address] [place] [address] [phone]        The results of significant diagnostics from this hospitalization (including imaging, microbiology, ancillary and laboratory) are listed below for reference.    Significant Diagnostic Studies: Dg Orthopantogram  03/20/2013   *RADIOLOGY REPORT*  Clinical Data: 52 year old female with endocarditis.  Query source.  ORTHOPANTOGRAM/PANORAMIC  Comparison: None.  Findings: Mandible intact. Bone mineralization is within normal limits.  Prominent dental caries left maxillary anterior molar. Questionable dental caries right medial maxillary incisor. Maxillary sinuses appear normally  pneumatized.  IMPRESSION: 1.  Prominent dental caries left maxillary anterior molar (tooth #14). 2.  Possible dental caries right medial maxillary incisor (tooth #8).   Original Report Authenticated By: Erskine Speed, M.D.   Dg Chest 2 View  03/16/2013   *RADIOLOGY REPORT*  Clinical Data: Fever and cough  CHEST - 2 VIEW  Comparison: Chest radiograph 03/15/2013  Findings: Stable cardiac silhouette.  There is patchy bilateral lower lobe air space disease not significantly changed from prior. Trace pleural effusions are again demonstrated.  No pneumothorax.  IMPRESSION: No change in  patchy bibasilar air space disease suggesting pulmonary infection.   Original Report Authenticated By: Genevive Bi, M.D.   Dg Chest 2 View  03/15/2013   *RADIOLOGY REPORT*  Clinical Data: Fever and cough  CHEST - 2 VIEW  Comparison: None.  Findings: There is patchy airspace opacity at the right lung base with coarse reticular opacities at both lung bases.  The lungs are otherwise clear.  No pleural effusion or pneumothorax.  Cardiac silhouette is mildly enlarged.  No mediastinal or hilar masses.  IMPRESSION: Right lung base infiltrate.  This projects in the right middle lobe with some also suspected in the right lower lobe.   Original Report Authenticated By: Amie Portland, M.D.    Microbiology: Recent Results (from the past 240 hour(s))  CULTURE, BLOOD (ROUTINE X 2)     Status: None   Collection Time    03/19/13  4:23 PM      Result Value Range Status   Specimen Description BLOOD LEFT ARM   Final   Special Requests BOTTLES DRAWN AEROBIC AND ANAEROBIC 10CC   Final   Culture  Setup Time 03/20/2013 04:07   Final   Culture NO GROWTH 5 DAYS   Final   Report Status 03/26/2013 FINAL   Final  CULTURE, BLOOD (ROUTINE X 2)     Status: None   Collection Time    03/19/13  4:24 PM      Result Value Range Status   Specimen Description BLOOD LEFT ARM   Final   Special Requests BOTTLES DRAWN AEROBIC AND ANAEROBIC 10CC   Final    Culture  Setup Time 03/20/2013 04:07   Final   Culture NO GROWTH 5 DAYS   Final   Report Status 03/26/2013 FINAL   Final     Labs: Basic Metabolic Panel:  Recent Labs Lab 03/19/13 1812 03/20/13  0530 03/21/13 0545 03/22/13 0455 03/23/13 0430  NA 132* 135 135 136 137  K 4.5 4.3 3.4* 4.2 4.3  CL 99 102 100 102 104  CO2 23 25 26 23 25   GLUCOSE 100* 83 91 101* 90  BUN 4* 4* 7 9 9   CREATININE 0.68 0.72 0.74 0.83 0.86  CALCIUM 9.6 9.1 9.1 9.2 9.1  MG  --   --   --  2.1  --    CBC:  Recent Labs Lab 03/19/13 1812 03/21/13 0545 03/22/13 0455 03/23/13 0430  WBC 9.7 7.6 7.0 6.2  NEUTROABS 7.6  --   --   --   HGB 10.7* 9.8* 9.7* 9.4*  HCT 30.5* 27.9* 27.7* 27.7*  MCV 91.3 89.7 89.6 90.5  PLT 502* 508* 537* 571*   BNP: BNP (last 3 results)  Recent Labs  03/15/13 1502 03/15/13 2023  PROBNP 4891.0* 4536.0*    Signed:  Stephani Police, PA-C [facility] 03/26/2013, 2:37 PM

## 2013-03-26 NOTE — Progress Notes (Signed)
    SUBJECTIVE: No chest pain or SOB.   BP 100/47  Pulse 79  Temp(Src) 98.2 F (36.8 C) (Oral)  Resp 18  Ht 5\' 2"  (1.575 m)  Wt 108 lb 7.5 oz (49.2 kg)  BMI 19.83 kg/m2  SpO2 100%  LMP 02/19/2013  Intake/Output Summary (Last 24 hours) at 03/26/13 0814 Last data filed at 03/26/13 1610  Gross per 24 hour  Intake    740 ml  Output   2150 ml  Net  -1410 ml    PHYSICAL EXAM General: Well developed, well nourished, in no acute distress. Alert and oriented x 3.  Psych:  Good affect, responds appropriately Neck: No JVD. No masses noted.  Lungs: Clear bilaterally with no wheezes or rhonci noted.  Heart: RRR with no murmurs noted. Abdomen: Bowel sounds are present. Soft, non-tender.  Extremities: No lower extremity edema.   LABS:No am labs.   Current Meds: . aspirin  81 mg Oral Daily  . cefTRIAXone (ROCEPHIN)  IV  2 g Intravenous Q12H  . clotrimazole  1 Applicatorful Vaginal QHS  . furosemide  40 mg Oral Daily  . metoprolol succinate  50 mg Oral Daily  . sodium chloride  3 mL Intravenous Q12H  . vancomycin  750 mg Intravenous Q12H     ASSESSMENT AND PLAN:   1. Rheumatic mitral valve disease with mild to moderate mitral stenosis and severe mitral regurgitation: She has been seen by Dr. Laneta Simmers and Dr. Cornelius Moras with CT surgery. Planning in place for  Awaiting further assessment by CT surgery for minimally invasive mitral valve replacement. No significant coronary artery disease by cath 03/22/13.Treating emperically for possible endocarditis, cultures negative. ID following. Will need dental cleaning and complete course of antibiotics then valve surgery.     2. Acute diastolic CHF secondary to valvular heart disease: Volume status at baseline. Much improved symptomatically.  RHC 6/27 showed PCWP 12, PA mean 34   4. AVNRT: on beta blocker. No recurrence.    MCALHANY,CHRISTOPHER  7/1/20148:14 AM

## 2013-03-27 ENCOUNTER — Encounter (HOSPITAL_COMMUNITY): Payer: Self-pay | Admitting: Pharmacy Technician

## 2013-03-27 NOTE — Progress Notes (Signed)
CSW received call from RN requesting assistance for patient with transportation- she had an appointment at 2pm at [facility] and was unable to secure a ride.  She also did not have any clothes to wear.  CSW was able to provide her with some pants and a shirt; Cab voucher provided to get her to [employer].  Patient states that that she will have transportation from there to home.  Notified pt's nurse of above. No further CSW needs identified.  CSW signing off.  Lorri Frederick. [place]  [phone]

## 2013-03-27 NOTE — Discharge Summary (Signed)
Addendum  Patient seen and examined, chart and data base reviewed.  I agree with the above assessment and plan.  For full details please see Mrs. Algis Downs PA note.   Clint Lipps, MD [facility] Pager: [phone] 03/27/2013, 1:42 PM

## 2013-04-02 ENCOUNTER — Encounter (HOSPITAL_COMMUNITY): Payer: Self-pay

## 2013-04-02 ENCOUNTER — Encounter (HOSPITAL_COMMUNITY)
Admission: RE | Admit: 2013-04-02 | Discharge: 2013-04-02 | Disposition: A | Discharge status: Home / Self Care | Payer: Medicaid Other | Source: Ambulatory Visit | Attending: Dentistry | Admitting: Dentistry

## 2013-04-02 HISTORY — DX: Essential (primary) hypertension: I10

## 2013-04-02 LAB — BASIC METABOLIC PANEL
BUN: 13 mg/dL (ref 6–23)
Calcium: 9.2 mg/dL (ref 8.4–10.5)
Creatinine, Ser: 1.46 mg/dL — ABNORMAL HIGH (ref 0.50–1.10)
GFR calc non Af Amer: 41 mL/min — ABNORMAL LOW (ref >90)
Glucose, Bld: 96 mg/dL (ref 70–99)
Potassium: 3.5 mEq/L (ref 3.5–5.1)

## 2013-04-02 LAB — CBC
HCT: 26.5 % — ABNORMAL LOW (ref 36.0–46.0)
Hemoglobin: 9.3 g/dL — ABNORMAL LOW (ref 12.0–15.0)
MCH: 31 pg (ref 26.0–34.0)
MCHC: 35.1 g/dL (ref 30.0–36.0)
MCV: 88.3 fL (ref 78.0–100.0)

## 2013-04-02 NOTE — Progress Notes (Signed)
Pt currently has picc line for vanco.  No cardiac md or PCP, she will come to [facility] for f/u

## 2013-04-02 NOTE — Pre-Procedure Instructions (Signed)
Mary [name]  04/02/2013   Your procedure is scheduled on:  04/04/13  Report to [facility] at 715 AM.  Call this number if you have problems the morning of surgery: [phone]   Remember:   Do not eat food or drink liquids after midnight.   Take these medicines the morning of surgery with A SIP OF WATER: metoprolol   Do not wear jewelry, make-up or nail polish.  Do not wear lotions, powders, or perfumes. You may wear deodorant.  Do not shave 48 hours prior to surgery. Men may shave face and neck.  Do not bring valuables to the hospital.  [facility] is not responsible                   for any belongings or valuables.  Contacts, dentures or bridgework may not be worn into surgery.  Leave suitcase in the car. After surgery it may be brought to your room.  For patients admitted to the hospital, checkout time is 11:00 AM the day of  discharge.   Patients discharged the day of surgery will not be allowed to drive  home.  Name and phone number of your driver: family  Special Instructions: Shower using CHG 2 nights before surgery and the night before surgery.  If you shower the day of surgery use CHG.  Use special wash - you have one bottle of CHG for all showers.  You should use approximately 1/3 of the bottle for each shower.   Please read over the following fact sheets that you were given: Pain Booklet, Coughing and Deep Breathing and Surgical Site Infection Prevention

## 2013-04-03 ENCOUNTER — Encounter: Payer: Self-pay | Admitting: Thoracic Surgery (Cardiothoracic Vascular Surgery)

## 2013-04-03 NOTE — Progress Notes (Signed)
Anesthesia chart review: Patient is a 52 year old female scheduled for multiple teeth extractions with alveoloplasty and preprosthetic surgery on 04/04/13 by Dr. Kristin Bruins.  She was recently hospitalized (03/15/13-03/26/13) for community acquired pneumonia and work up also revealed severe mitral regurgitation and possible infectious endocarditis.  She will ultimately require future mitral valve replacement (Dr. Cornelius Moras).  During her recent hospitalization she also developed paroxsymal SVT in the setting of acute diastolic CHF.  Other history includes anemia, GERD, former smoker, HTN, childhood asthma.  Dr. 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 She was seen by [facility] Cardiologist Dr. Johney Frame and Dr. Clifton James last month.    TEE on 03/18/13 showed normal LV systolic function, EF 60-65%, normal LV wall motion. Mitral valve with ". The anterior leaflet had a hockey stick appearance. The posterior leaflet was fixed/immobile. There was severe mitral regurgitation. There was probable mild mitral stenosis (the mean gradient across the mitral valve 14 mmHg but thought to be elevated due to severe MR => MVA by PHT was 1.68 cm^2.  Likely endocarditis although mitral valve couldn't be can be 2 rheumatic heart disease. Moderately dilated left atrium. Mild to moderate tricuspid regurgitation. Small circumferential pericardial effusion without cancer.  Cardiac cath on 03/15/2013 showed no angiographic evidence of CAD, normal LV function with EF 65-70%, severe mitral regurgitation.  Her last EKG on 03/16/13 showed SVT @ 162 bpm. (EKG on 03/15/13 showed ST @ 102, biatrial enlargement, anterior infarct (age undetermined). She converted to NSR with 6 mg of adenosine and was started on Toprol XL.  Her HR was documented as 102 bpm at her PAT visit.  Her last CXR on 03/16/13 showed patchy bibasilar air space disease suggesting pulmonary infection, trace pleural effusions.  Will plan to repeat a CXR on the day  of surgery.  Preoperative labs noted. Creatinine 1.46 (up from 0.86), H/H 9.3/26.5 (stable since 03/23/13).  She had been discharged home on Vancomycin and Lasix which has likely contributed in the increased Cr.  I think her renal function and anemia can be followed post-operatively.  She is scheduled for a pregnancy test on the day of surgery.  Velna Ochs [facility]/Anesthesiology Phone [phone] 04/03/2013 10:14 AM

## 2013-04-04 ENCOUNTER — Encounter (HOSPITAL_COMMUNITY): Payer: Self-pay | Admitting: *Deleted

## 2013-04-04 ENCOUNTER — Encounter (HOSPITAL_COMMUNITY)
Admission: RE | Disposition: A | Discharge status: Home / Self Care | Payer: Self-pay | Source: Ambulatory Visit | Attending: Dentistry

## 2013-04-04 ENCOUNTER — Ambulatory Visit (HOSPITAL_COMMUNITY): Payer: Medicaid Other | Admitting: Critical Care Medicine

## 2013-04-04 ENCOUNTER — Ambulatory Visit (HOSPITAL_COMMUNITY): Payer: Medicaid Other

## 2013-04-04 ENCOUNTER — Ambulatory Visit (HOSPITAL_COMMUNITY)
Admission: RE | Admit: 2013-04-04 | Discharge: 2013-04-04 | Disposition: A | Discharge status: Home / Self Care | Payer: Medicaid Other | Source: Ambulatory Visit | Attending: Dentistry | Admitting: Dentistry

## 2013-04-04 ENCOUNTER — Encounter (HOSPITAL_COMMUNITY): Payer: Self-pay | Admitting: Vascular Surgery

## 2013-04-04 DIAGNOSIS — K0530 Chronic periodontitis, unspecified: Secondary | ICD-10-CM | POA: Insufficient documentation

## 2013-04-04 DIAGNOSIS — I509 Heart failure, unspecified: Secondary | ICD-10-CM | POA: Insufficient documentation

## 2013-04-04 DIAGNOSIS — K0389 Other specified diseases of hard tissues of teeth: Secondary | ICD-10-CM

## 2013-04-04 DIAGNOSIS — K029 Dental caries, unspecified: Secondary | ICD-10-CM | POA: Insufficient documentation

## 2013-04-04 DIAGNOSIS — I330 Acute and subacute infective endocarditis: Secondary | ICD-10-CM

## 2013-04-04 DIAGNOSIS — I38 Endocarditis, valve unspecified: Secondary | ICD-10-CM

## 2013-04-04 DIAGNOSIS — I079 Rheumatic tricuspid valve disease, unspecified: Secondary | ICD-10-CM | POA: Insufficient documentation

## 2013-04-04 DIAGNOSIS — I10 Essential (primary) hypertension: Secondary | ICD-10-CM | POA: Insufficient documentation

## 2013-04-04 DIAGNOSIS — K219 Gastro-esophageal reflux disease without esophagitis: Secondary | ICD-10-CM | POA: Insufficient documentation

## 2013-04-04 DIAGNOSIS — K05329 Chronic periodontitis, generalized, unspecified severity: Secondary | ICD-10-CM

## 2013-04-04 DIAGNOSIS — J45909 Unspecified asthma, uncomplicated: Secondary | ICD-10-CM | POA: Insufficient documentation

## 2013-04-04 DIAGNOSIS — K036 Deposits [accretions] on teeth: Secondary | ICD-10-CM

## 2013-04-04 DIAGNOSIS — Z01812 Encounter for preprocedural laboratory examination: Secondary | ICD-10-CM | POA: Insufficient documentation

## 2013-04-04 DIAGNOSIS — Z87891 Personal history of nicotine dependence: Secondary | ICD-10-CM | POA: Insufficient documentation

## 2013-04-04 DIAGNOSIS — I059 Rheumatic mitral valve disease, unspecified: Secondary | ICD-10-CM | POA: Insufficient documentation

## 2013-04-04 HISTORY — PX: MULTIPLE EXTRACTIONS WITH ALVEOLOPLASTY: SHX5342

## 2013-04-04 LAB — HCG, SERUM, QUALITATIVE: Preg, Serum: NEGATIVE

## 2013-04-04 SURGERY — MULTIPLE EXTRACTION WITH ALVEOLOPLASTY
Anesthesia: General | Site: Mouth | Wound class: Clean Contaminated

## 2013-04-04 MED ORDER — FENTANYL CITRATE 0.05 MG/ML IJ SOLN
INTRAMUSCULAR | Status: DC | PRN
  Administered 2013-04-04 (×5): 25 ug via INTRAVENOUS
  Administered 2013-04-04: 50 ug via INTRAVENOUS
  Administered 2013-04-04: 25 ug via INTRAVENOUS

## 2013-04-04 MED ORDER — LIDOCAINE-EPINEPHRINE 2 %-1:100000 IJ SOLN
INTRAMUSCULAR | Status: DC | PRN
  Administered 2013-04-04: 10.2 mL

## 2013-04-04 MED ORDER — LACTATED RINGERS IV SOLN: INTRAVENOUS | Status: DC

## 2013-04-04 MED ORDER — HYDROMORPHONE HCL PF 1 MG/ML IJ SOLN: 0.2500 mg | INTRAMUSCULAR | Status: DC | PRN

## 2013-04-04 MED ORDER — DIPHENHYDRAMINE HCL 25 MG PO CAPS
ORAL_CAPSULE | ORAL | Status: AC
  Administered 2013-04-04: 25 mg via ORAL
  Filled 2013-04-04: qty 2

## 2013-04-04 MED ORDER — STERILE WATER FOR IRRIGATION IR SOLN
Status: DC | PRN
  Administered 2013-04-04: 1

## 2013-04-04 MED ORDER — OXYCODONE-ACETAMINOPHEN 5-325 MG PO TABS: 1.0000 | ORAL_TABLET | ORAL | Status: DC | PRN

## 2013-04-04 MED ORDER — MIDAZOLAM HCL 5 MG/5ML IJ SOLN
INTRAMUSCULAR | Status: DC | PRN
  Administered 2013-04-04: 2 mg via INTRAVENOUS

## 2013-04-04 MED ORDER — SODIUM CHLORIDE 0.9 % IR SOLN
Status: DC | PRN
  Administered 2013-04-04: 1

## 2013-04-04 MED ORDER — OXYMETAZOLINE HCL 0.05 % NA SOLN
NASAL | Status: AC
  Filled 2013-04-04: qty 15

## 2013-04-04 MED ORDER — DIPHENHYDRAMINE HCL 50 MG/ML IJ SOLN: 25.0000 mg | Freq: Once | INTRAMUSCULAR | Status: DC

## 2013-04-04 MED ORDER — DIPHENHYDRAMINE HCL 50 MG/ML IJ SOLN: 25.0000 mg | Freq: Once | INTRAMUSCULAR | Status: AC

## 2013-04-04 MED ORDER — ONDANSETRON HCL 4 MG/2ML IJ SOLN
INTRAMUSCULAR | Status: AC
  Administered 2013-04-04: 4 mg
  Filled 2013-04-04: qty 2

## 2013-04-04 MED ORDER — BUPIVACAINE-EPINEPHRINE PF 0.5-1:200000 % IJ SOLN
INTRAMUSCULAR | Status: AC
  Filled 2013-04-04: qty 3.6

## 2013-04-04 MED ORDER — ONDANSETRON HCL 4 MG/2ML IJ SOLN
INTRAMUSCULAR | Status: DC | PRN
  Administered 2013-04-04: 4 mg via INTRAVENOUS

## 2013-04-04 MED ORDER — GLYCOPYRROLATE 0.2 MG/ML IJ SOLN
INTRAMUSCULAR | Status: DC | PRN
  Administered 2013-04-04: 0.2 mg via INTRAVENOUS

## 2013-04-04 MED ORDER — PROPOFOL 10 MG/ML IV BOLUS
INTRAVENOUS | Status: DC | PRN
  Administered 2013-04-04: 20 mg via INTRAVENOUS

## 2013-04-04 MED ORDER — BUPIVACAINE-EPINEPHRINE 0.5% -1:200000 IJ SOLN
INTRAMUSCULAR | Status: DC | PRN
  Administered 2013-04-04: 3.6 mL

## 2013-04-04 MED ORDER — DIPHENHYDRAMINE HCL 50 MG/ML IJ SOLN
INTRAMUSCULAR | Status: AC
  Administered 2013-04-04: 12.5 mg via INTRAVENOUS
  Filled 2013-04-04: qty 1

## 2013-04-04 MED ORDER — PROPOFOL INFUSION 10 MG/ML OPTIME
INTRAVENOUS | Status: DC | PRN
  Administered 2013-04-04: 75 ug/kg/min via INTRAVENOUS

## 2013-04-04 MED ORDER — HEMOSTATIC AGENTS (NO CHARGE) OPTIME
TOPICAL | Status: DC | PRN
  Administered 2013-04-04: 1 via TOPICAL

## 2013-04-04 MED ORDER — HYDROMORPHONE HCL PF 1 MG/ML IJ SOLN
0.2500 mg | INTRAMUSCULAR | Status: DC | PRN
  Administered 2013-04-04: 0.5 mg via INTRAVENOUS

## 2013-04-04 MED ORDER — LIDOCAINE HCL (CARDIAC) 20 MG/ML IV SOLN
INTRAVENOUS | Status: DC | PRN
  Administered 2013-04-04: 20 mg via INTRAVENOUS

## 2013-04-04 MED ORDER — OXYCODONE-ACETAMINOPHEN 5-325 MG PO TABS
ORAL_TABLET | ORAL | Status: AC
  Administered 2013-04-04: 2 via ORAL
  Filled 2013-04-04: qty 2

## 2013-04-04 MED ORDER — LACTATED RINGERS IV SOLN
INTRAVENOUS | Status: DC
  Administered 2013-04-04: 09:00:00 via INTRAVENOUS

## 2013-04-04 MED ORDER — DIPHENHYDRAMINE HCL 25 MG PO TABS: 50.0000 mg | ORAL_TABLET | Freq: Four times a day (QID) | ORAL | Status: DC | PRN

## 2013-04-04 MED ORDER — VANCOMYCIN HCL IN DEXTROSE 1-5 GM/200ML-% IV SOLN
INTRAVENOUS | Status: AC
  Administered 2013-04-04: 1000 mg via INTRAVENOUS
  Filled 2013-04-04: qty 200

## 2013-04-04 MED ORDER — HYDROMORPHONE HCL PF 1 MG/ML IJ SOLN
INTRAMUSCULAR | Status: AC
  Administered 2013-04-04: 0.5 mg via INTRAVENOUS
  Filled 2013-04-04: qty 1

## 2013-04-04 MED ORDER — OXYCODONE-ACETAMINOPHEN 5-325 MG PO TABS: ORAL_TABLET | ORAL | Status: DC

## 2013-04-04 MED ORDER — LIDOCAINE-EPINEPHRINE 2 %-1:100000 IJ SOLN
INTRAMUSCULAR | Status: AC
  Filled 2013-04-04: qty 10.2

## 2013-04-04 SURGICAL SUPPLY — 37 items
ALCOHOL 70% 16 OZ (MISCELLANEOUS) ×2 IMPLANT
ATTRACTOMAT 16X20 MAGNETIC DRP (DRAPES) ×2 IMPLANT
BLADE SURG 15 STRL LF DISP TIS (BLADE) ×2 IMPLANT
BLADE SURG 15 STRL SS (BLADE) ×4
CLOTH BEACON ORANGE TIMEOUT ST (SAFETY) ×2 IMPLANT
COVER SURGICAL LIGHT HANDLE (MISCELLANEOUS) ×2 IMPLANT
CRADLE DONUT ADULT HEAD (MISCELLANEOUS) ×2 IMPLANT
GAUZE PACKING FOLDED 2  STR (GAUZE/BANDAGES/DRESSINGS) ×1
GAUZE PACKING FOLDED 2 STR (GAUZE/BANDAGES/DRESSINGS) ×1 IMPLANT
GAUZE SPONGE 4X4 16PLY XRAY LF (GAUZE/BANDAGES/DRESSINGS) ×3 IMPLANT
GLOVE SURG ORTHO 8.0 STRL STRW (GLOVE) ×2 IMPLANT
GLOVE SURG SS PI 6.5 STRL IVOR (GLOVE) ×2 IMPLANT
GOWN STRL REIN 3XL LVL4 (GOWN DISPOSABLE) ×2 IMPLANT
HEMOSTAT SURGICEL .5X2 ABSORB (HEMOSTASIS) IMPLANT
KIT BASIN OR (CUSTOM PROCEDURE TRAY) ×2 IMPLANT
KIT ROOM TURNOVER OR (KITS) ×2 IMPLANT
MANIFOLD NEPTUNE WASTE (CANNULA) ×2 IMPLANT
NDL BLUNT 16X1.5 OR ONLY (NEEDLE) ×1 IMPLANT
NDL DENTAL 27 LONG (NEEDLE) IMPLANT
NEEDLE BLUNT 16X1.5 OR ONLY (NEEDLE) ×2 IMPLANT
NEEDLE DENTAL 27 LONG (NEEDLE) ×4 IMPLANT
NS IRRIG 1000ML POUR BTL (IV SOLUTION) ×2 IMPLANT
PACK EENT II TURBAN DRAPE (CUSTOM PROCEDURE TRAY) ×2 IMPLANT
PAD ARMBOARD 7.5X6 YLW CONV (MISCELLANEOUS) ×4 IMPLANT
SPONGE GAUZE 4X4 12PLY (GAUZE/BANDAGES/DRESSINGS) ×2 IMPLANT
SPONGE SURGIFOAM ABS GEL 100 (HEMOSTASIS) ×1 IMPLANT
SPONGE SURGIFOAM ABS GEL 12-7 (HEMOSTASIS) IMPLANT
SPONGE SURGIFOAM ABS GEL SZ50 (HEMOSTASIS) IMPLANT
SUCTION FRAZIER TIP 10 FR DISP (SUCTIONS) ×2 IMPLANT
SUT CHROMIC 3 0 PS 2 (SUTURE) ×5 IMPLANT
SUT CHROMIC 4 0 P 3 18 (SUTURE) IMPLANT
SYR 50ML SLIP (SYRINGE) ×2 IMPLANT
TOWEL OR 17X24 6PK STRL BLUE (TOWEL DISPOSABLE) ×2 IMPLANT
TOWEL OR 17X26 10 PK STRL BLUE (TOWEL DISPOSABLE) ×2 IMPLANT
TUBE CONNECTING 12X1/4 (SUCTIONS) ×2 IMPLANT
WATER STERILE IRR 1000ML POUR (IV SOLUTION) ×2 IMPLANT
YANKAUER SUCT BULB TIP NO VENT (SUCTIONS) ×2 IMPLANT

## 2013-04-04 NOTE — Anesthesia Postprocedure Evaluation (Signed)
  Anesthesia Post-op Note  Patient: Mary [name]  Procedure(s) Performed: Procedure(s): Extraction of tooth #'s 1,8,9,13,14,15,23,24,25,26 with alveoloplasty and gross debridement of remaining teeth (N/A)  Patient Location: PACU  Anesthesia Type:MAC  Level of Consciousness: awake  Airway and Oxygen Therapy: Patient Spontanous Breathing  Post-op Pain: mild  Post-op Assessment: Post-op Vital signs reviewed  Post-op Vital Signs: Reviewed  Complications: No apparent anesthesia complications

## 2013-04-04 NOTE — Op Note (Signed)
Patient:            Mary [name] Date of Birth:  18-Oct-1960 MRN:                [id number]   DATE OF PROCEDURE:  04/04/2013               OPERATIVE REPORT   PREOPERATIVE DIAGNOSES: 1. Bacterial endocarditis 2. Mitral regurgitation 3. Pre-mitral valve surgery dental protocol 4. Chronic periodontitis 5. Dental caries  6. Accretions  POSTOPERATIVE DIAGNOSES: 1. Bacterial endocarditis 2. Mitral regurgitation 3. Pre-mitral valve surgery dental protocol 4. Chronic periodontitis 5. Dental caries  6. Accretions  OPERATIONS: 1. Multiple extraction of tooth numbers 1, 8, 9, 13, 14, 15, 23, 24, 25, 26. 2. 4 Quadrants of alveoloplasty 3. Gross debridement of remaining dentition   SURGEON: Charlynne Pander, DDS  ASSISTANT: Zettie Pho, (dental assistant)  ANESTHESIA: Monitored anesthesia care per the anesthesia team   MEDICATIONS: 1. vancomycin 1000 mg IV prior to invasive dental procedures. 2. Local anesthesia with a total utilization of 4 carpules each containing 34 mg of lidocaine with 0.017 mg of epinephrine as well as 2 carpules each containing 9 mg of bupivacaine with 0.009 mg of epinephrine.  SPECIMENS: There are 10 teeth that were discarded.  DRAINS: None  CULTURES: None  COMPLICATIONS: None   ESTIMATED BLOOD LOSS: 50 mLs.  INTRAVENOUS FLUIDS:  400 mLs of Lactated ringers solution.  INDICATIONS: The patient was recently diagnosed with bacterial endocarditis and mitral regurgitation.  A dental consultation was then requested to rule out dental infection that may affect the patient's systemic health and the anticipated heart valve surgery.  The patient was examined and treatment planned for extraction of multiple teeth with alveoloplasty and gross debridement of remaining dentition.    OPERATIVE FINDINGS: Patient was examined operating room number 8.  The teeth were identified for extraction. Tooth numbers 1, 8, 9, 13, 14, 23, 24, 25, 26 were initially  identified for extraction and upon further examination, tooth #15 was determined to need extraction due to periodontal involvement and extensive dental caries on the distal buccal. Please note: The mandibular right lingual torus was further evaluated for removal and it was determined that this would NOT need to be reduced at this time. The patient was noted be affected by chronic periodontitis, accretions, dental caries, and multiple mobile teeth.   DESCRIPTION OF PROCEDURE: Patient was brought to the main operating room number 8. Patient was then placed in the supine position on the operating table. Monitored anesthesia care was then induced per the anesthesia team. The patient was then prepped and draped in the usual manner for dental medicine procedure. A timeout was performed. The patient was identified and procedures were verified. The oral cavity was then thoroughly examined with the findings noted above. The patient was then ready for dental medicine procedure as follows:  Local anesthesia was then administered sequentially with a total utilization of 4 carpules each containing 34 mg of lidocaine with 0.017 mg of epinephrine as well as 2 carpules  each containing 9 mg bupivacaine with 0.009 mg of epinephrine.  The Maxillary left and right quadrants first approached. Anesthesia was then delivered utilizing infiltration with lidocaine with epinephrine. Tooth numbers 8-9 were first approached. The teeth were subluxated and removed with a 150 forceps without complications. A #15 blade incision was then made from the mesial numbers 7 and extended to the mesial #10.  A  surgical flap was then carefully reflected. Alveoloplasty was  then performed utilizing a rongeur and bone file. The tissues were approximated and trimmed appropriately. The surgical site was then closed from the mesial of #and extended the mesial numbers 7 utilizing 3-0 chromic gut suture in a continuous interrupted suture technique  x1.  The maxillary left quadrant was then approached. A 15 blade incision was made from the mesial numbers 16 and extended to the distal of #12.  Tooth numbers 13, 14, and 15 were then subluxated with a series of straight elevators.  Further bone was then removed utilizing a surgical handpiece and bur and copious amounts of sterile water. Tooth #13 and 14 were then removed with a 150 forceps without complications. The coronal aspect of tooth #15 was then removed with a 53R forceps. This left the buccal roots remaining. Further bone was then removed appropriately.  The roots were then elevated out with a root tip pick. Alveoloplasty was then performed utilizing a rongeur and bone file.  The surgical site was then irrigated with copious of sterile saline. A piece of Surgifoam was then placed in each extraction socket appropriately. The surgical site was then closed from the mesial #16 and extended to the distal of #12 utilizing 3-0 chromic gut suture in a continuous interrupted suture technique x1.  At this point time tooth #1 was approached. The tooth was subluxated with a series of straight elevators. Tooth was then removed with a 150 forceps without complications.   At this point time a distal wedge procedure was performed to remove the redundant tissue. The tissues were then further approximated and trimmed appropriately. The surgical site was then irrigated with copious of sterile saline. A piece of Surgifoam was then placed in the extraction socket of tooth #1.  The surgical site was then closed from the distal of the tuberosity and extended to the distal of #15 utilizing 3-0 chromic gut suture material using a continuous interrupted suture technique.  An interproximal suture was then placed between 2 and 3.  At this point time, the mandibular quadrants were approached. The patient was given bilateral inferior alveolar nerve blocks and long buccal nerve blocks utilizing the bupivacaine with epinephrine.  Further infiltration was then achieved utilizing the lidocaine with epinephrine. A 15 blade incision was then made from the mesial of #22 and extended to the mesial numbers 27 .  A surgical flap was then carefully reflected. Appropriate amounts of buccal and interseptal bone were then removed appropriately. Tooth numbers 23, 24, 25, and 26 were then removed utilizing a 151 forceps without complications. Alveoloplasty was then performed utilizing a rongeurs and bone file. The tissues were approximated and trimmed appropriately. The surgical sites were then irrigated with copious amounts of sterile saline. The surgical site was then closed from the mesial of #22 and extended to the mesial numbers 27 utilizing 3-0 chromic gut material in a continuous interrupted suture technique x1. 3 individual interrupted sutures were then placed to further closed surgical site.  At this point time a gross debridement procedure was performed utilizing a sonic scaler. A series of hand curettes were utilized to further remove accretions. A sonic scaler was then again used to refine removal of accretions.  At this point time, the entire mouth was irrigated with copious amounts of sterile saline. The patient was exam for complications, seeing none, the dental medicine procedure was deemed to be complete.  A series of 4 x 4 gauze were placed in the mouth to aid hemostasis. The patient was then handed over to  the anesthesia team for final disposition. After an appropriate amount of time, the patient was  taken to the postanesthsia care unit with stable vital signs and a good condition. All counts were correct for the dental medicine procedure. Patient to return to clinic for suture removal and dental restorations.   Charlynne Pander, DDS.

## 2013-04-04 NOTE — Progress Notes (Signed)
Report given to Vania Rea RN

## 2013-04-04 NOTE — Preoperative (Signed)
Beta Blockers   Reason not to administer Beta Blockers:Not Applicable, pt took metoprolol on 04/04/13

## 2013-04-04 NOTE — Progress Notes (Signed)
While in post op patient voiced complaint of "itching" all over. No rash noted. Nurse called Dr. Noreene Larsson and informed him of this. Dr. Noreene Larsson ordered for patient to have 25 mg of IV Benadryl. Will enter orders and administer.

## 2013-04-04 NOTE — Transfer of Care (Signed)
Immediate Anesthesia Transfer of Care Note  Patient: Mary [name]  Procedure(s) Performed: Procedure(s): Extraction of tooth #'s 1,8,9,13,14,15,23,24,25,26 with alveoloplasty and gross debridement of remaining teeth (N/A)  Patient Location: PACU  Anesthesia Type:MAC  Level of Consciousness: awake, alert  and oriented  Airway & Oxygen Therapy: Patient Spontanous Breathing and Patient connected to nasal cannula oxygen  Post-op Assessment: Report given to PACU RN, Post -op Vital signs reviewed and stable and Patient moving all extremities X 4  Post vital signs: Reviewed and stable  Complications: No apparent anesthesia complications

## 2013-04-04 NOTE — Anesthesia Preprocedure Evaluation (Addendum)
Anesthesia Evaluation  Patient identified by MRN, date of birth, ID band Patient awake    Reviewed: Allergy & Precautions, H&P , NPO status , Patient's Chart, lab work & pertinent test results, reviewed documented beta blocker date and time   Airway Mallampati: II TM Distance: >3 FB Neck ROM: Full    Dental  (+) Dental Advisory Given and Poor Dentition   Pulmonary shortness of breath, asthma , pneumonia -, resolved, former smoker,  breath sounds clear to auscultation        Cardiovascular hypertension, + Orthopnea + dysrhythmias Supra Ventricular Tachycardia + Valvular Problems/Murmurs (mitral stenosis with regurgitation) Rhythm:Regular Rate:Normal     Neuro/Psych    GI/Hepatic Neg liver ROS, GERD-  ,  Endo/Other  negative endocrine ROS  Renal/GU negative Renal ROS     Musculoskeletal   Abdominal   Peds  Hematology   Anesthesia Other Findings   Reproductive/Obstetrics                        Anesthesia Physical Anesthesia Plan  ASA: III  Anesthesia Plan: MAC   Post-op Pain Management:    Induction: Intravenous  Airway Management Planned: Nasal ETT  Additional Equipment:   Intra-op Plan:   Post-operative Plan: Extubation in OR  Informed Consent: I have reviewed the patients History and Physical, chart, labs and discussed the procedure including the risks, benefits and alternatives for the proposed anesthesia with the patient or authorized representative who has indicated his/her understanding and acceptance.   Dental advisory given  Plan Discussed with: Anesthesiologist and Surgeon  Anesthesia Plan Comments:        Anesthesia Quick Evaluation

## 2013-04-04 NOTE — Progress Notes (Signed)
04/04/2013  Patient:            Mary [name] Date of Birth:  1961/04/10 MRN:                [id number]  PRE-OPERATIVE NOTE:  VITALS: BP 108/76  Pulse 107  Temp(Src) 97.9 F (36.6 C) (Oral)  Resp 20  SpO2 95%  LMP 03/02/2013  Lab Results  Component Value Date   WBC 9.7 04/02/2013   HGB 9.3* 04/02/2013   HCT 26.5* 04/02/2013   MCV 88.3 04/02/2013   PLT 406* 04/02/2013   BMET    Component Value Date/Time   NA 138 04/02/2013 1120   K 3.5 04/02/2013 1120   CL 104 04/02/2013 1120   CO2 23 04/02/2013 1120   GLUCOSE 96 04/02/2013 1120   BUN 13 04/02/2013 1120   CREATININE 1.46* 04/02/2013 1120   CALCIUM 9.2 04/02/2013 1120   GFRNONAA 41* 04/02/2013 1120   GFRAA 47* 04/02/2013 1120    Lab Results  Component Value Date   INR 0.97 03/22/2013   INR 1.06 08/23/2010   No results found for this basename: PTT     Brea A Harbold presents for multiple dental extractions with alveoloplasty and pre-prosthetic surgery as indicated along with gross debridement of remaining dentition in the operating room with general anesthesia. We discussed the risks, benefits, complications of the treatment to include bleeding, infection, nerve damage, sinus involvement, fracture root tips, fracture of the alveolus or mandible but cannot exclude other potential risks. Patient also is aware of the potential risk for complications due to cardiovascular compromise up to and including death.  SUBJECTIVE: The patient denies any acute medical or dental changes and agrees to proceed with treatment as planned.  EXAM: No sign of acute dental changes.  ASSESSMENT: Patient is affected by chronic periodontitis, accretions, tooth mobility, dental caries, and the presence of a small mandibular right lingual torus.  PLAN: Patient agrees to proceed with treatment as planned in the operating room as previously discussed and accepts the risks, benefits, complications of the proposed treatment.  Charlynne Pander, DDS

## 2013-04-04 NOTE — Anesthesia Procedure Notes (Signed)
Procedure Name: MAC Date/Time: 04/04/2013 9:30 AM Performed by: Elon Alas Pre-anesthesia Checklist: Patient identified, Emergency Drugs available, Suction available, Patient being monitored and Timeout performed Oxygen Delivery Method: Nasal cannula Placement Confirmation: positive ETCO2 and breath sounds checked- equal and bilateral Dental Injury: Teeth and Oropharynx as per pre-operative assessment

## 2013-04-04 NOTE — H&P (Signed)
04/04/2013  Patient:            Ysidro Evert Date of Birth:  Jan 24, 1961 MRN:                [id number]  ELVENIA GODDEN presents for dental OR procedure. The patient denies having any acute medical or dental changes. Please see progress note from Dr. Cornelius Moras dated 03/25/2013 to act as H&P for the dental operating room procedure today.  Charlynne Pander, DDS  Progress Notes signed by Purcell Nails, MD at 03/25/2013 7:57 PM    Author: Purcell Nails, MD Service: Cardiothoracic Surgery Author Type: Physician   Filed: 03/25/2013 7:57 PM Note Time: 03/25/2013 7:30 PM     Related Notes: Original Note by Purcell Nails, MD filed at 03/25/2013 7:54 PM        [address]  [name] [address]  [phone]  CARDIOTHORACIC SURGERY PROGRESS NOTE  3 Days Post-Op S/P Procedure(s) (LRB):  LEFT AND RIGHT HEART CATHETERIZATION WITH CORONARY ANGIOGRAM (N/A)  Subjective:  Patient reports feeling quite well, essentially back to her baseline  Objective:  Vital signs in last 24 hours:  Temp: [97.7 F (36.5 C)-98.4 F (36.9 C)] 97.9 F (36.6 C) (06/30 1423)  Pulse Rate: [76-82] 76 (06/30 1423)  Cardiac Rhythm: [-] Normal sinus rhythm (06/30 0702)  Resp: [18] 18 (06/30 1423)  BP: (85-97)/(52-60) 85/59 mmHg (06/30 1423)  SpO2: [100 %] 100 % (06/30 1423)  Weight: [49.3 kg (108 lb 11 oz)] 49.3 kg (108 lb 11 oz) (06/30 0434)  Physical Exam:  Breath sounds: Fairly clear  Heart sounds: RRR  Incisions: n/a  Abdomen: soft  Extremities: warm  Intake/Output from previous day:  06/29 0701 - 06/30 0700  In: 643 [P.O.:240; I.V.:3; IV Piggyback:400]  Out: 1250 [Urine:1250]  Intake/Output this shift:  Lab Results:  Recent Labs      03/23/13 0430     WBC  6.2     HGB  9.4*     HCT  27.7*     PLT  571*     BMET:  Recent Labs      03/23/13 0430     NA  137     K  4.3     CL  104     CO2  25     GLUCOSE  90     BUN  9     CREATININE  0.86     CALCIUM  9.1     CBG (last  3)  No results found for this basename: GLUCAP, in the last 72 hours  PT/INR: No results found for this basename: LABPROT, INR, in the last 72 hours  CXR: N/A  Assessment/Plan:  S/P Procedure(s) (LRB):  LEFT AND RIGHT HEART CATHETERIZATION WITH CORONARY ANGIOGRAM (N/A)  I have reviewed Ms Buchta's history, labs, transthoracic and transesophageal echocardiograms, and cath and all culture results. 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 She also has moderate tricuspid regurgitation, and she has had some non-sustained runs of SVT. At the time of her presentation she had acute systolic congestive heart failure with pulmonary edema, but these have responded nicely to medical therapy. At this point the diagnosis of bacterial endocarditis remains presumptive, as all cultures have remained no growth to date (although reportedly the first set were obtained after the patient had artery been given antibiotics). Although the transthoracic echocardiogram was  more suggestive of endocarditis, a vegetation was not clearly seen on transesophageal echocardiogram. The transesophageal echocardiogram did not reveal signs of leaflet perforation nor other complications which might lead to rapid onset or progression of congestive heart failure. 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 I don't think there's any question that she will need to undergo surgical intervention for management of severe mitral regurgitation, but at this point the real question is timing of surgery. I am skeptical that her valve will be repairable and I suspect that valve replacement using a mechanical prosthesis will be her best option given her relatively young age. She appears to be a good candidate for minimally  invasive approach for surgery.  At this point I would favor proceeding with dental cleaning and extraction as soon as practical, after which time we allow the patient to complete her course of antibiotics prior to proceeding with elective mitral valve repair or replacement. Will continue to follow closely.  OWEN,CLARENCE H  03/25/2013  7:30 PM

## 2013-04-04 NOTE — Progress Notes (Addendum)
Mary Pitman RN Spoke with Dr Noreene Larsson and informed him of pts need to take a taxi home and concern re: diphenhydramine injection.  He statte we should try 12.5mg  first and see if that helps and pt is able to go home if she is able to ambulate via public transportation and has someone with her.   1538  Pt when she wakes up, appears to be scratching all about.  No visible hives, whelps, redness noted.  The friend wanted to know if we could supply them with additional anti itch medication for use at home.   I called Dr. Noreene Larsson and explained the situation..the patient and friend are taking the bus home.  He gave the order to give her 2 --25mg  tab for use at home for itching.   Will relay info to patient and friend.  DA

## 2013-04-05 ENCOUNTER — Encounter (HOSPITAL_COMMUNITY): Payer: Self-pay | Admitting: Dentistry

## 2013-04-11 ENCOUNTER — Encounter (HOSPITAL_COMMUNITY): Payer: Self-pay | Admitting: Dentistry

## 2013-04-11 ENCOUNTER — Ambulatory Visit (HOSPITAL_COMMUNITY): Payer: Self-pay | Admitting: Dentistry

## 2013-04-11 ENCOUNTER — Encounter: Payer: Self-pay | Admitting: Internal Medicine

## 2013-04-11 ENCOUNTER — Ambulatory Visit (INDEPENDENT_AMBULATORY_CARE_PROVIDER_SITE_OTHER): Payer: Medicaid Other | Admitting: Internal Medicine

## 2013-04-11 VITALS — BP 103/66 | HR 87 | Temp 98.2°F

## 2013-04-11 VITALS — BP 102/67 | HR 84 | Temp 97.9°F | Wt 113.0 lb

## 2013-04-11 DIAGNOSIS — K029 Dental caries, unspecified: Secondary | ICD-10-CM

## 2013-04-11 DIAGNOSIS — I330 Acute and subacute infective endocarditis: Secondary | ICD-10-CM

## 2013-04-11 DIAGNOSIS — K08409 Partial loss of teeth, unspecified cause, unspecified class: Secondary | ICD-10-CM

## 2013-04-11 DIAGNOSIS — K08109 Complete loss of teeth, unspecified cause, unspecified class: Secondary | ICD-10-CM

## 2013-04-11 DIAGNOSIS — N179 Acute kidney failure, unspecified: Secondary | ICD-10-CM

## 2013-04-11 LAB — BASIC METABOLIC PANEL WITH GFR
CO2: 21 mEq/L (ref 19–32)
Calcium: 9.3 mg/dL (ref 8.4–10.5)
Creat: 1.66 mg/dL — ABNORMAL HIGH (ref 0.50–1.10)
GFR, Est African American: 41 mL/min — ABNORMAL LOW
Glucose, Bld: 83 mg/dL (ref 70–99)
Sodium: 138 mEq/L (ref 135–145)

## 2013-04-11 NOTE — Patient Instructions (Signed)
Return to dental clinic as scheduled for continued dental restorations. Patient to continue to use salt water rinses every 2 hours while awake. Patient to continue chlorhexidine rinses as prescribed. Patient to call if problems with healing. Dr. Kristin Bruins

## 2013-04-11 NOTE — Progress Notes (Signed)
POST OPERATIVE AND DENTAL TREATMENT NOTE:  04/11/2013 CHRISTELL STEINMILLER [id number]  VITALS: BP 103/66  Pulse 87  Temp(Src) 98.2 F (36.8 C) (Oral)  LMP 03/22/2013  Patient is status post multiple extractions with alveoloplasty and gross debridement of remaining dentition in the OR on 04/04/13. Patient now presents for evaluation of healing, suture removal, dental restorations of tooth 3's 10 and 11. Patient is still on IV antibiotic therapy for the endocarditis. No additional premedication as required today.  SUBJECTIVE: Patient with minimal complaints. Sutures are still present.  EXAM: No sign of infection, heme, or ooze. Primary closure is generalized. Sutures are loosely intact. Dental caries are noted as before  ASSESSMENT: Post operative course is consistent with dental procedures performed in the operating room. Dental caries as noted before.  PLAN: 1. Suture removal - today. 2. Dental restoration of #10 and 11.  Procedure: 30 second chlorhexidine gluconate rinse. Sutures removed without complication. Local anesthesia with infiltration with lidocaine with epinephrine to the area #10 and #11. [id number]  #10   MFDL/Resin AE, DBA, Tetric [employer]am EVO A3.0,  Deep caries-no exposure. [place].   [id number]  #11   MFL/Resin AE, DBA, Tetric [employer]am EVO A3.5. [place]. Deep caries. No exposure. Patient to watch for irreversible pulpitis symptoms. Patient tolerated procedure well. Patient dismissed in stable condition. Use salt water rinses as needed for sore gums.  RTC next Tuesday for continued restorations.   Charlynne Pander, DDS

## 2013-04-12 NOTE — Progress Notes (Signed)
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   MRN: [id number]  HPI Mary [name] is a 52 y.o. female with hx of born with a heart murmur, GERD, smoker who presents to the ED on 6/20 with a 2-3 day history of fevers to 102F, SOB on exertion and dry cough.She noticed to have some nasal congestion x 3 days but most noteably had DOE and Bilateral LE swelling up to her knees x 1 day. She has new onset 4 pillow orthopnea. 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  In the ED, she is found to have temp of 100.4, lab shows WBC elevated at 12.3 a chest x-ray revealed a RML/RLL infiltrate and thus she was admitted for community acquired pneumonia txd with ctx, azithromycin. Her ECG revealed sinus tachycardia with BAE along with an elevated BNP and bilateral edema leading to cardiology consultation for potential heart failure symptoms. On 6/22 TTE showed the anterior mitral valve leaflet appears myxomatous with MVP and moderate to severe MR.There is a large vegetation on the anterior MV leaflet which is mobile and possibly a vegetatin on the posterior leaflet. Moderate to severe regurgitation. She subsequently underwent TEE On 6/23 which showed Normal LV size and systolic function, EF 60-65%. Mild to moderate TR with peak RV-RA gradient about 100 mmHg suggesting severe pulmonary hypertension. Moderate LAE. The mitral valve was thickened and the anterior leaflet had a hockey-stick appearance. The posterior leaflet was fixed/immobile. severe mitral regurgitation. The mitral valve was thickened in appearance on portions of both leaflets which is concerning for SBE. There was debate to whether her mitral valve had vegetation suggestive of endocarditis.  Due to her symptoms of 3 weeks of feeling feeling poorly +/- fevers, she was treated for presumed endocarditis with vancomycin and ceftriaxone empirically x 6 wks. She is currently finishing her 4th week of Iv antibiotics. She denies any difficulty with picc line. She now doing much better with feelings of dyspnea since having improved management of heart failure. She is anticipated to have valvular replacement in the near future after finishing antibiotics and dental work. She has had numerous lower teeth extracted and getting dental caries fixed.  No fevers, chills, nightsweats, chest pain.  Current Outpatient Prescriptions on File Prior to Visit  Medication Sig Dispense Refill  . aspirin 81 MG chewable tablet Chew 1 tablet (81 mg total) by mouth daily.      . clotrimazole (GYNE-LOTRIMIN) 1 % vaginal cream Place 1 Applicatorful vaginally at bedtime.  45 g  0  . dextrose 5 % SOLN 50 mL with cefTRIAXone 2 G SOLR 2 g Inject 2 g into the vein every 12 (twelve) hours.  90 g  0  . furosemide (LASIX) 40 MG tablet Take 1 tablet (40 mg total) by mouth daily.  30 tablet  3  . metoprolol succinate (TOPROL-XL) 50 MG 24 hr tablet Take 1 tablet (50 mg total) by mouth daily. Take with or immediately following a meal.  30 tablet  3  . Vancomycin (VANCOCIN) 750 MG/150ML SOLN Inject 150 mLs (750 mg total) into the vein every 12 (twelve) hours.  6750 mL  0  . oxyCODONE-acetaminophen (PERCOCET) 5-325 MG per tablet Take  one or two tablets by mouth every 4-6 hours as needed for pain.  30 tablet  0   No current facility-administered medications on file prior to visit.   Active Ambulatory Problems    Diagnosis Date Noted  . Tachypnea 03/15/2013  . Leucocytosis 03/15/2013  . CAP (community acquired pneumonia) 03/15/2013  . Undiagnosed cardiac murmurs 03/15/2013  . Acute diastolic heart failure 03/15/2013  . SVT (supraventricular tachycardia) 03/16/2013  . 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 Rheumatic mitral  stenosis with regurgitation 03/23/2013   Resolved Ambulatory Problems    Diagnosis Date Noted  . No Resolved Ambulatory Problems   Past Medical History  Diagnosis Date  . Heart murmur   . Asthma   . Orthopnoea 03/15/2013  . Anemia   . History of blood transfusion   . GERD (gastroesophageal reflux disease)   . Hypertension      Review of Systems  Constitutional: Negative for fever, chills, diaphoresis, activity change, appetite change, fatigue and unexpected weight change.  HENT: Negative for congestion, sore throat, rhinorrhea, sneezing, trouble swallowing and sinus pressure.  Eyes: Negative for photophobia and visual disturbance.  Respiratory: Negative for cough, chest tightness, shortness of breath, wheezing and stridor.  Cardiovascular: Negative for chest pain, palpitations and leg swelling.  Gastrointestinal: Negative for nausea, vomiting, abdominal pain, diarrhea, constipation, blood in stool, abdominal distention and anal bleeding.  Genitourinary: Negative for dysuria, hematuria, flank pain and difficulty urinating.  Musculoskeletal: Negative for myalgias, back pain, joint swelling, arthralgias and gait problem.  Skin: Negative for color change, pallor, rash and wound.  Neurological: Negative for dizziness, tremors, weakness and light-headedness.  Hematological: Negative for adenopathy. Does not bruise/bleed easily.  Psychiatric/Behavioral: Negative for behavioral problems, confusion, sleep disturbance, dysphoric mood, decreased concentration and agitation.       Objective:   Physical Exam BP 102/67  Pulse 84  Temp(Src) 97.9 F (36.6 C) (Oral)  Wt 113 lb (51.256 kg)  BMI 20.66 kg/m2  LMP 03/22/2013 Physical Exam  Constitutional: He is oriented to person, place, and time. appears well-developed and well-nourished. No distress.  HENT:  Mouth/Throat: Oropharynx is clear and moist. No oropharyngeal exudate.  Cardiovascular: Normal rate, regular rhythm and normal heart  sounds. 3/6 SEM BH at apex and axilla. Radiating to bilateral USB. Pulmonary/Chest: Effort normal and breath sounds normal. No respiratory distress. no wheezes.  Abdominal: Soft. Bowel sounds are normal.exhibits no distension. There is no tenderness.  Lymphadenopathy: no cervical adenopathy.  Neurological: alert and oriented to person, place, and time.  Skin: Skin is warm and dry. No rash noted. No erythema.  Psychiatric: a normal mood and affect. His behavior is normal.   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 Will discuss with advance to minimize doses. Will ask them to check bmp twice a week to ensure no further insult.  rtc in 2 wks at the completion of IV  antibiotics

## 2013-04-15 ENCOUNTER — Emergency Department (HOSPITAL_COMMUNITY): Payer: Medicaid Other

## 2013-04-15 ENCOUNTER — Encounter (HOSPITAL_COMMUNITY): Payer: Self-pay | Admitting: Emergency Medicine

## 2013-04-15 ENCOUNTER — Emergency Department (HOSPITAL_COMMUNITY)
Admission: EM | Admit: 2013-04-15 | Discharge: 2013-04-15 | Disposition: A | Discharge status: Home / Self Care | Payer: Medicaid Other | Attending: Emergency Medicine | Admitting: Emergency Medicine

## 2013-04-15 DIAGNOSIS — Z87891 Personal history of nicotine dependence: Secondary | ICD-10-CM | POA: Insufficient documentation

## 2013-04-15 DIAGNOSIS — J45909 Unspecified asthma, uncomplicated: Secondary | ICD-10-CM | POA: Insufficient documentation

## 2013-04-15 DIAGNOSIS — K219 Gastro-esophageal reflux disease without esophagitis: Secondary | ICD-10-CM | POA: Insufficient documentation

## 2013-04-15 DIAGNOSIS — R011 Cardiac murmur, unspecified: Secondary | ICD-10-CM | POA: Insufficient documentation

## 2013-04-15 DIAGNOSIS — R000 Tachycardia, unspecified: Secondary | ICD-10-CM | POA: Insufficient documentation

## 2013-04-15 DIAGNOSIS — Z885 Allergy status to narcotic agent status: Secondary | ICD-10-CM | POA: Insufficient documentation

## 2013-04-15 DIAGNOSIS — R002 Palpitations: Secondary | ICD-10-CM | POA: Insufficient documentation

## 2013-04-15 DIAGNOSIS — D649 Anemia, unspecified: Secondary | ICD-10-CM | POA: Insufficient documentation

## 2013-04-15 DIAGNOSIS — Z9189 Other specified personal risk factors, not elsewhere classified: Secondary | ICD-10-CM | POA: Insufficient documentation

## 2013-04-15 DIAGNOSIS — R0602 Shortness of breath: Secondary | ICD-10-CM | POA: Insufficient documentation

## 2013-04-15 DIAGNOSIS — Z8701 Personal history of pneumonia (recurrent): Secondary | ICD-10-CM | POA: Insufficient documentation

## 2013-04-15 DIAGNOSIS — Z79899 Other long term (current) drug therapy: Secondary | ICD-10-CM | POA: Insufficient documentation

## 2013-04-15 DIAGNOSIS — Z7982 Long term (current) use of aspirin: Secondary | ICD-10-CM | POA: Insufficient documentation

## 2013-04-15 DIAGNOSIS — I10 Essential (primary) hypertension: Secondary | ICD-10-CM | POA: Insufficient documentation

## 2013-04-15 LAB — COMPREHENSIVE METABOLIC PANEL
ALT: 6 U/L (ref 0–35)
BUN: 9 mg/dL (ref 6–23)
CO2: 20 mEq/L (ref 19–32)
Calcium: 8.5 mg/dL (ref 8.4–10.5)
Creatinine, Ser: 1.26 mg/dL — ABNORMAL HIGH (ref 0.50–1.10)
GFR calc Af Amer: 57 mL/min — ABNORMAL LOW (ref >90)
GFR calc non Af Amer: 49 mL/min — ABNORMAL LOW (ref >90)
Glucose, Bld: 80 mg/dL (ref 70–99)
Sodium: 137 mEq/L (ref 135–145)
Total Protein: 6.7 g/dL (ref 6.0–8.3)

## 2013-04-15 LAB — CBC WITH DIFFERENTIAL/PLATELET
Eosinophils Absolute: 0.4 10*3/uL (ref 0.0–0.7)
Eosinophils Relative: 5 % (ref 0–5)
HCT: 27.9 % — ABNORMAL LOW (ref 36.0–46.0)
Lymphocytes Relative: 11 % — ABNORMAL LOW (ref 12–46)
Lymphs Abs: 0.7 10*3/uL (ref 0.7–4.0)
MCH: 30.5 pg (ref 26.0–34.0)
MCV: 90.6 fL (ref 78.0–100.0)
Monocytes Absolute: 0.6 10*3/uL (ref 0.1–1.0)
Platelets: 345 10*3/uL (ref 150–400)
RBC: 3.08 MIL/uL — ABNORMAL LOW (ref 3.87–5.11)
RDW: 16.3 % — ABNORMAL HIGH (ref 11.5–15.5)

## 2013-04-15 LAB — PRO B NATRIURETIC PEPTIDE: Pro B Natriuretic peptide (BNP): 5910 pg/mL — ABNORMAL HIGH (ref 0–125)

## 2013-04-15 LAB — TROPONIN I: Troponin I: <0.3 ng/mL (ref <0.30)

## 2013-04-15 MED ORDER — HEPARIN SOD (PORK) LOCK FLUSH 100 UNIT/ML IV SOLN
250.0000 [IU] | INTRAVENOUS | Status: DC | PRN
  Administered 2013-04-15: 250 [IU]
  Filled 2013-04-15: qty 3

## 2013-04-15 MED ORDER — ALTEPLASE 2 MG IJ SOLR
2.0000 mg | INTRAMUSCULAR | Status: AC
  Administered 2013-04-15: 2 mg
  Filled 2013-04-15: qty 2

## 2013-04-15 MED ORDER — SODIUM CHLORIDE 0.9 % IJ SOLN
10.0000 mL | INTRAMUSCULAR | Status: DC | PRN
  Administered 2013-04-15: 10 mL

## 2013-04-15 MED ORDER — SODIUM CHLORIDE 0.9 % IJ SOLN: 10.0000 mL | Freq: Two times a day (BID) | INTRAMUSCULAR | Status: DC

## 2013-04-15 MED ORDER — HEPARIN SOD (PORK) LOCK FLUSH 100 UNIT/ML IV SOLN: 250.0000 [IU] | Freq: Every day | INTRAVENOUS | Status: DC

## 2013-04-15 NOTE — ED Notes (Signed)
Spoke with IV team-- TPA here to declot PICC line. Then pt must wait 2 hours before PICC is checked for blood return.

## 2013-04-15 NOTE — ED Notes (Signed)
PATIENT HAS ARRIVED ON POD C TO FINISH HAVING HER PICC LINE UNCLOGGED

## 2013-04-15 NOTE — ED Notes (Signed)
PT WAS AMBULATED FROM POD B TO POD C. PT SOB AFTER AMBULATION.

## 2013-04-15 NOTE — ED Notes (Signed)
Report given to Drinda Butts, RN -- will move pt to rm C25-- for IV team to TPA PICC line.

## 2013-04-15 NOTE — ED Notes (Signed)
Report to karen, RN

## 2013-04-15 NOTE — ED Notes (Signed)
CSW provided pt with taxi voucher transportation.   

## 2013-04-15 NOTE — ED Provider Notes (Signed)
History    CSN: [id number] Arrival date & time 04/15/13  1007  First MD Initiated Contact with Patient 04/15/13 1028     Chief Complaint  Patient presents with  . Irregular Heart Beat  . Emesis   (Consider location/radiation/quality/duration/timing/severity/associated sxs/prior Treatment) HPI\ Pt with PICC line in place and has 2 more days of IV abx for suspected endocarditis has been doing well with treatment. In normal states of health this AM with had acute onset tachycardia with rate into 160's. Associated with SOB. Pt states she has had multiple episodes of similar symptoms in the past. Palpitations resolved shortly before EMS arrived. Pt is now completely asymptomatic. No lower ext, swelling or pain. No fever or chills.  Past Medical History  Diagnosis Date  . Heart murmur   . CAP (community acquired pneumonia) 03/15/2013    "maybe; that's what they are thinking today" (03/15/2013)  . Asthma     "as a baby"   . Orthopnoea 03/15/2013    "in the last 2 days" (03/15/2013)  . Anemia   . History of blood transfusion     "14 w/1st pregnancy; 2 w/last C-section" (03/15/2013)  . GERD (gastroesophageal reflux disease)   . Hypertension     no pcp   will go to [facility]   Past Surgical History  Procedure Laterality Date  . Appendectomy  ~1978  . Cesarean section  1983; 1999  . Cholecystectomy  2000's  . Tubal ligation  1999  . Tee without cardioversion N/A 03/18/2013    Procedure: TRANSESOPHAGEAL ECHOCARDIOGRAM (TEE);  Surgeon: Laurey Morale, MD;  Location: [facility] ENDOSCOPY;  Service: Cardiovascular;  Laterality: N/A;  . Cardiac catheterization    . Multiple extractions with alveoloplasty N/A 04/04/2013    Procedure: Extraction of tooth #'s 1,8,9,13,14,15,23,24,25,26 with alveoloplasty and gross debridement of remaining teeth;  Surgeon: Charlynne Pander, DDS;  Location: [facility] OR;  Service: Oral Surgery;  Laterality: N/A;   No family history on file. History  Substance Use Topics  . Smoking  status: Former Smoker -- 0.50 packs/day for 36 years    Types: Cigarettes    Quit date: 03/15/2013  . Smokeless tobacco: Never Used  . Alcohol Use: 3.6 oz/week    6 Glasses of wine per week     Comment: 03/15/2013 "bottle of wine/wk"   OB History   Grav Para Term Preterm Abortions TAB SAB Ect Mult Living                 Review of Systems  Constitutional: Negative for fever, chills and fatigue.  HENT: Negative for neck pain.   Respiratory: Positive for shortness of breath. Negative for cough, chest tightness and wheezing.   Cardiovascular: Positive for palpitations. Negative for chest pain and leg swelling.  Gastrointestinal: Negative for nausea, vomiting and abdominal pain.  Musculoskeletal: Negative for myalgias and back pain.  Skin: Negative for rash and wound.  Neurological: Negative for dizziness, weakness, light-headedness, numbness and headaches.  All other systems reviewed and are negative.    Allergies  Oxycodone  Home Medications   Current Outpatient Rx  Name  Route  Sig  Dispense  Refill  . aspirin 81 MG chewable tablet   Oral   Chew 1 tablet (81 mg total) by mouth daily.         . clotrimazole (GYNE-LOTRIMIN) 1 % vaginal cream   Vaginal   Place 1 Applicatorful vaginally at bedtime.   45 g   0   . dextrose 5 %  SOLN 50 mL with cefTRIAXone 2 G SOLR 2 g   Intravenous   Inject 2 g into the vein every 12 (twelve) hours.   90 g   0   . furosemide (LASIX) 40 MG tablet   Oral   Take 1 tablet (40 mg total) by mouth daily.   30 tablet   3   . ibuprofen (ADVIL,MOTRIN) 200 MG tablet   Oral   Take 400 mg by mouth every 6 (six) hours as needed for pain (Tooth pain).         . metoprolol succinate (TOPROL-XL) 50 MG 24 hr tablet   Oral   Take 1 tablet (50 mg total) by mouth daily. Take with or immediately following a meal.   30 tablet   3   . vancomycin (VANCOCIN) 500 MG injection   Other   500 mg by Other route daily. Through pic line          BP  105/75  Pulse 88  Temp(Src) 98.1 F (36.7 C) (Oral)  Resp 20  Ht 5\' 3"  (1.6 m)  Wt 113 lb (51.256 kg)  BMI 20.02 kg/m2  SpO2 98%  LMP 03/22/2013 Physical Exam  Nursing note and vitals reviewed. Constitutional: She is oriented to person, place, and time. She appears well-developed and well-nourished. No distress.  HENT:  Head: Normocephalic and atraumatic.  Mouth/Throat: Oropharynx is clear and moist.  Eyes: EOM are normal. Pupils are equal, round, and reactive to light.  Neck: Normal range of motion. Neck supple.  Cardiovascular: Normal rate and regular rhythm.   Murmur heard. Pulmonary/Chest: Effort normal and breath sounds normal. No respiratory distress. She has no wheezes. She has no rales. She exhibits no tenderness.  Abdominal: Soft. Bowel sounds are normal. She exhibits no distension and no mass. There is no tenderness. There is no rebound and no guarding.  Musculoskeletal: Normal range of motion. She exhibits no edema and no tenderness.  No calf swelling or tenderness  Neurological: She is alert and oriented to person, place, and time.  Skin: Skin is warm and dry. No rash noted. No erythema.  Psychiatric: She has a normal mood and affect. Her behavior is normal.    ED Course  Procedures (including critical care time) Labs Reviewed  CBC WITH DIFFERENTIAL - Abnormal; Notable for the following:    RBC 3.08 (*)    Hemoglobin 9.4 (*)    HCT 27.9 (*)    RDW 16.3 (*)    Lymphocytes Relative 11 (*)    All other components within normal limits  COMPREHENSIVE METABOLIC PANEL - Abnormal; Notable for the following:    Creatinine, Ser 1.26 (*)    Albumin 2.9 (*)    GFR calc non Af Amer 49 (*)    GFR calc Af Amer 57 (*)    All other components within normal limits  PRO B NATRIURETIC PEPTIDE - Abnormal; Notable for the following:    Pro B Natriuretic peptide (BNP) 5910.0 (*)    All other components within normal limits  TROPONIN I   Dg Chest 2 View  04/15/2013   *RADIOLOGY  REPORT*  Clinical Data: Chest pain, irregular heart rate, shortness of breath and vomiting.  CHEST - 2 VIEW  Comparison: 04/04/2013  Findings: Stable cardiomegaly.  Stable positioning of PICC line in the lower SVC.  Lungs show chronic scarring and atelectasis in both lower zones without evidence of pulmonary edema or focal airspace consolidation.  No pleural fluid is identified.  The bony  thorax is unremarkable.  IMPRESSION: Stable cardiomegaly without pulmonary edema.  Stable positioning of PICC line.   Original Report Authenticated By: Irish Lack, M.D.   1. Rapid palpitations     Date: 04/15/2013  Rate: 85  Rhythm: normal sinus rhythm  QRS Axis: normal  Intervals: normal  ST/T Wave abnormalities: normal  Conduction Disutrbances:none  Narrative Interpretation:   Old EKG Reviewed: unchanged   MDM  Pt remains asymptomatic in ED. Suspect brief run of SVT. Return precautions given.   Loren Racer, MD 04/15/13 1344

## 2013-04-15 NOTE — ED Notes (Signed)
Pic line in rt. arm

## 2013-04-15 NOTE — ED Notes (Signed)
Pt. Stated, i started having irregular heart beat around 0800.  I have a [employer] for endocarditis.

## 2013-04-15 NOTE — ED Notes (Signed)
Patient transported to X-ray 

## 2013-04-16 ENCOUNTER — Ambulatory Visit (HOSPITAL_COMMUNITY): Payer: Self-pay | Admitting: Dentistry

## 2013-04-16 ENCOUNTER — Encounter (HOSPITAL_COMMUNITY): Payer: Self-pay | Admitting: Dentistry

## 2013-04-16 VITALS — BP 91/62 | HR 94 | Temp 98.1°F

## 2013-04-16 DIAGNOSIS — I38 Endocarditis, valve unspecified: Secondary | ICD-10-CM

## 2013-04-16 DIAGNOSIS — I340 Nonrheumatic mitral (valve) insufficiency: Secondary | ICD-10-CM

## 2013-04-16 DIAGNOSIS — K029 Dental caries, unspecified: Secondary | ICD-10-CM

## 2013-04-16 DIAGNOSIS — K08409 Partial loss of teeth, unspecified cause, unspecified class: Secondary | ICD-10-CM

## 2013-04-16 DIAGNOSIS — Z0189 Encounter for other specified special examinations: Secondary | ICD-10-CM

## 2013-04-16 DIAGNOSIS — K08119 Complete loss of teeth due to trauma, unspecified class: Secondary | ICD-10-CM

## 2013-04-16 NOTE — Patient Instructions (Addendum)
Return to clinic for scheduled appointment for completion of dental restorations. Use salt water rinses as needed to aid healing or outcomes. Use over-the-counter pain medication as needed for dental pain. Call if problems arise before then.  Charlynne Pander, DDS

## 2013-04-16 NOTE — Progress Notes (Signed)
04/16/2013  Patient:            Mary [name] Date of Birth:  [birth year] MRN:                [id number]  BP 91/62  Pulse 94  Temp(Src) 98.1 F (36.7 C) (Oral)  LMP 03/22/2013   Mary [name] presents for dental restorations today. Patient denies problems with previous dental restorations placed. She agrees to proceed with continued dental restorations involving the upper right quadrant. Patient is still on IV antibiotic therapy that will act as premedication for the dental procedures.  Procedures: Preop 30 second chlorhexidine gluconate rinse. 72 Xylocaine with epi .036 mg via infiltration. U1324 #3 MO/amalgam and L/Amalgam  Class V Gluma, Tytin alloy. M0102 #4 DO/amalgam Gluma, Tytin alloy. V2536 #5 DO/amalgam and L/amalgam -class V Gluma, Tytin alloy. Deep, but no exposure noted. To follow for irreversible symptoms. Occlusion checked and adjusted as needed. Amalgams burnished. BW taken post op and margins were confirmed. Post op chlorhexidine gluconate rinse for 30 seconds. Patient to return to clinic for continued dental restorations as scheduled. Patient dismissed in stable condition.   Charlynne Pander, DDS

## 2013-04-18 ENCOUNTER — Encounter (HOSPITAL_COMMUNITY): Payer: Self-pay | Admitting: Dentistry

## 2013-04-18 ENCOUNTER — Ambulatory Visit (HOSPITAL_COMMUNITY): Payer: Medicaid - Dental | Admitting: Dentistry

## 2013-04-18 VITALS — BP 113/79 | HR 109 | Temp 98.1°F

## 2013-04-18 DIAGNOSIS — K029 Dental caries, unspecified: Secondary | ICD-10-CM

## 2013-04-18 DIAGNOSIS — K08409 Partial loss of teeth, unspecified cause, unspecified class: Secondary | ICD-10-CM

## 2013-04-18 DIAGNOSIS — K08109 Complete loss of teeth, unspecified cause, unspecified class: Secondary | ICD-10-CM

## 2013-04-18 DIAGNOSIS — I340 Nonrheumatic mitral (valve) insufficiency: Secondary | ICD-10-CM

## 2013-04-18 DIAGNOSIS — I38 Endocarditis, valve unspecified: Secondary | ICD-10-CM

## 2013-04-18 DIAGNOSIS — Z0189 Encounter for other specified special examinations: Secondary | ICD-10-CM

## 2013-04-18 MED ORDER — CHLORHEXIDINE GLUCONATE 0.12 % MT SOLN: OROMUCOSAL | Status: DC

## 2013-04-18 NOTE — Progress Notes (Signed)
04/18/2013  Patient:            Mary [name] Date of Birth:  1961/04/17 MRN:                [id number]  BP 113/79  Pulse 109  Temp(Src) 98.1 F (36.7 C) (Oral)  LMP 03/22/2013  Mary [name] presents for continued dental restorations today. Patient denies problems with previous dental restorations placed. She agrees to proceed with continued dental restorations involving the lower left quadrant. Patient is still on IV antibiotic therapy that will act as premedication for the dental procedures.  Procedures: Preop 30 second chlorhexidine gluconate rinse. 36 Xylocaine with epi .018 mg via inferior alveolar nerve block and long buccal nerve block.  D2140 #17 O/amalgam  Gluma, Tytin alloy. Burnish. W0981 #20 DB/amalgam Gluma, Tytin alloy. Burnish. Deep on distal #17 but no exposure noted. To follow for irreversible symptoms. Occlusion checked and adjusted as needed. Post op chlorhexidine gluconate rinse for 30 seconds.  We had an extensive discussion of followup required with a new primary dentist. This dentist will need to perform an exam, obtain dental radiographs as needed, and proceed with evaluation for replacement of missing teeth. This appointment should be performed once she is medically stable from the anticipated heart valve surgery. Patient will require antibiotic premedication prior to invasive dental procedures due to history of endocarditis and anticipated heart valve surgery. Patient also was prescribed chlorhexidine rinses to use on a twice daily basis to aid in the disinfection of the oral cavity over the next 4-6 months. The prescription was faxed to her pharmacy today.  The patient is currently cleared for heart valve surgery with Dr. Cornelius Moras at his discretion. Patient is scheduled to see Dr. Cornelius Moras on 04/22/2013. Patient dismissed in stable condition. Patient to call if acute problems arise before then.  Charlynne Pander, DDS

## 2013-04-18 NOTE — Patient Instructions (Addendum)
Patient was instructed to brush after meals and at bedtime. Patient is to floss at night. Patient is to use chlorhexidine gluconate rinses as directed. Patient is to rinse with 15 ML's for 30 seconds twice daily after breakfast and at bedtime. Patient is to spit out excess. Patient is to followup with the primary dentist of her choice for an exam, dental radiographs, and evaluation for replacement of missing teeth once medically stable from the anticipated heart valve surgery. Patient will require antibiotic premedication prior to invasive dental procedures due to history of endocarditis and anticipated heart valve surgery.\ Patient to call if problems arise before then. Charlynne Pander, DDS

## 2013-04-22 ENCOUNTER — Encounter: Payer: Self-pay | Admitting: Thoracic Surgery (Cardiothoracic Vascular Surgery)

## 2013-04-22 ENCOUNTER — Ambulatory Visit (INDEPENDENT_AMBULATORY_CARE_PROVIDER_SITE_OTHER): Payer: Medicaid Other | Admitting: Thoracic Surgery (Cardiothoracic Vascular Surgery)

## 2013-04-22 VITALS — BP 105/73 | HR 94 | Resp 18 | Ht 62.0 in | Wt 110.0 lb

## 2013-04-22 DIAGNOSIS — I079 Rheumatic tricuspid valve disease, unspecified: Secondary | ICD-10-CM

## 2013-04-22 DIAGNOSIS — I071 Rheumatic tricuspid insufficiency: Secondary | ICD-10-CM | POA: Insufficient documentation

## 2013-04-22 DIAGNOSIS — I330 Acute and subacute infective endocarditis: Secondary | ICD-10-CM

## 2013-04-22 DIAGNOSIS — I059 Rheumatic mitral valve disease, unspecified: Secondary | ICD-10-CM

## 2013-04-22 DIAGNOSIS — I340 Nonrheumatic mitral (valve) insufficiency: Secondary | ICD-10-CM

## 2013-04-22 HISTORY — DX: Nonrheumatic mitral (valve) insufficiency: I34.0

## 2013-04-22 MED ORDER — AMIODARONE HCL 200 MG PO TABS: 200.0000 mg | ORAL_TABLET | Freq: Two times a day (BID) | ORAL | Status: DC

## 2013-04-22 NOTE — Progress Notes (Signed)
[address]       [name] [address]             [phone]     CARDIOTHORACIC SURGERY CONSULTATION REPORT  Referring Provider is Hillis Range, MD PCP is No PCP Per Patient  Chief Complaint  Patient presents with  . Mitral Regurgitation    F/U from Inpt hospital consult, TEE 03/18/13, Cardiac Cath 03/22/13    HPI:  Patient is a 52 year old [place] American female who returns for followup of severe symptomatic mitral regurgitation. She was originally seen in consultation by Dr. Laneta Simmers and myself during her recent hospitalization for acute exacerbation of congestive heart failure.  She was admitted to the hospital 03/15/2013 with resting shortness of breath, orthopnea, lower extremity edema, fever and chills. She was initially treated with empiric antibiotics for presumed community acquired pneumonia. She was noted to have a prominent systolic murmur on physical exam and an echocardiogram demonstrated severe mitral regurgitation. There was suggestion of the presence of a vegetation on the anterior leaflet of the mitral valve by transthoracic echocardiogram, and subsequently patient was treated for the presumptive diagnosis of bacterial endocarditis. Blood cultures have always remained negative, although apparently none were obtained prior to initiation of antibiotic therapy.  Transesophageal echocardiogram performed by Dr. 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 There was no clear vegetation identified.  Her symptoms of congestive heart failure improved rapidly with diuretic therapy.  Left and right heart catheterization was notable for the absence of significant coronary artery disease and confirmed the presence of severe mitral regurgitation. There was moderate pulmonary hypertension. Plans were made at that time to allow the patient to complete her course of empiric antibiotics and check  followup with cultures once antibiotic therapy was discontinued.  Since hospital discharge the patient has also been seen by Dr. [name] in the dental clinic and undergone dental extraction. She is scheduled to complete her intravenous antibiotics later this week after which time she will be seen in the Infectious Disease clinic.  Since hospital discharge the patient reports remaining clinically stable. She still has exertional shortness of breath, particularly when she goes up and down a flight of stairs. She denies resting shortness of breath, PND, orthopnea, or lower extremity edema. She's not had any fevers or chills or night sweats. She has mild pain in both knees.     Past Medical History  Diagnosis Date  . Heart murmur   . CAP (community acquired pneumonia) 03/15/2013    "maybe; that's what they are thinking today" (03/15/2013)  . Asthma     "as a baby"   . Orthopnoea 03/15/2013    "in the last 2 days" (03/15/2013)  . Anemia   . History of blood transfusion     "14 w/1st pregnancy; 2 w/last C-section" (03/15/2013)  . GERD (gastroesophageal reflux disease)   . Hypertension     no pcp   will go to [facility]  . Severe mitral regurgitation 04/22/2013  . SVT (supraventricular tachycardia) 03/16/2013  . Acute diastolic heart failure 03/15/2013  . 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 Tricuspid regurgitation     Past Surgical History  Procedure Laterality Date  . Appendectomy  ~1978  . Cesarean section  1983; 1999  . Cholecystectomy  2000's  . Tubal ligation  1999  . Tee without cardioversion N/A 03/18/2013    Procedure: TRANSESOPHAGEAL ECHOCARDIOGRAM (TEE);  Surgeon:  Laurey Morale, MD;  Location: [facility] ENDOSCOPY;  Service: Cardiovascular;  Laterality: N/A;  . Cardiac catheterization    . Multiple extractions with alveoloplasty N/A 04/04/2013    Procedure: Extraction of tooth #'s 1,8,9,13,14,15,23,24,25,26 with alveoloplasty and gross debridement of remaining teeth;   Surgeon: Charlynne Pander, DDS;  Location: [facility] OR;  Service: Oral Surgery;  Laterality: N/A;    No family history on file.  History   Social History  . Marital Status: Single    Spouse Name: N/A    Number of Children: N/A  . Years of Education: N/A   Occupational History  . Not on file.   Social History Main Topics  . Smoking status: Former Smoker -- 0.50 packs/day for 36 years    Types: Cigarettes    Quit date: 03/15/2013  . Smokeless tobacco: Never Used  . Alcohol Use: 3.6 oz/week    6 Glasses of wine per week     Comment: 03/15/2013 "bottle of wine/wk"  . Drug Use: No  . Sexually Active: No   Other Topics Concern  . Not on file   Social History Narrative  . No narrative on file    Current Outpatient Prescriptions  Medication Sig Dispense Refill  . aspirin 81 MG chewable tablet Chew 1 tablet (81 mg total) by mouth daily.      . cefTRIAXone (ROCEPHIN) 1 G injection Inject 2 g into the muscle every 12 (twelve) hours. With 50 cc of D5W      . chlorhexidine (PERIDEX) 0.12 % solution Rinse with 15 mls twice daily for 30 seconds. Use after breakfast and at bedtime. Spit out excess. Do not swallow.  960 mL  PRN  . clotrimazole (GYNE-LOTRIMIN) 1 % vaginal cream Place 1 Applicatorful vaginally at bedtime.  45 g  0  . furosemide (LASIX) 40 MG tablet Take 1 tablet (40 mg total) by mouth daily.  30 tablet  3  . ibuprofen (ADVIL,MOTRIN) 200 MG tablet Take 400 mg by mouth every 6 (six) hours as needed for pain (Tooth pain).      . metoprolol succinate (TOPROL-XL) 50 MG 24 hr tablet Take 1 tablet (50 mg total) by mouth daily. Take with or immediately following a meal.  30 tablet  3  . vancomycin (VANCOCIN) 500 MG injection 500 mg by Other route daily. Through pic line       No current facility-administered medications for this visit.    Allergies  Allergen Reactions  . Oxycodone Itching      Review of Systems:   General:  normal appetite, normal energy, no weight gain, no  weight loss, no fever  Cardiac:  no chest pain with exertion, no chest pain at rest, + SOB with exertion, no resting SOB, no PND, no orthopnea, no palpitations, no arrhythmia, no atrial fibrillation, no LE edema, no dizzy spells, no syncope  Respiratory:  + exertional shortness of breath, no home oxygen, no productive cough, no dry cough, no bronchitis, no wheezing, no hemoptysis, no asthma, no pain with inspiration or cough, no sleep apnea, no CPAP at night  GI:   no difficulty swallowing, no reflux, no frequent heartburn, no hiatal hernia, no abdominal pain, no constipation, no diarrhea, no hematochezia, no hematemesis, no melena  GU:   no dysuria,  no frequency, no urinary tract infection, no hematuria, no kidney stones, no kidney disease  Vascular:  no pain suggestive of claudication, no pain in feet, no leg cramps, no varicose veins, no DVT, no non-healing  foot ulcer  Neuro:   no stroke, no TIA's, no seizures, no headaches, no temporary blindness one eye,  no slurred speech, no peripheral neuropathy, no chronic pain, no instability of gait, no memory/cognitive dysfunction  Musculoskeletal: + arthritis, no joint swelling, no myalgias, no difficulty walking, normal mobility   Skin:   no rash, no itching, no skin infections, no pressure sores or ulcerations  Psych:   no anxiety, no depression, no nervousness, no unusual recent stress  Eyes:   no blurry vision, no floaters, no recent vision changes, does not wears glasses or contacts  ENT:   no hearing loss, no loose or painful teeth, last saw dentist last week  Hematologic:  no easy bruising, no abnormal bleeding, no  clotting disorder, no frequent epistaxis  Endocrine:  no diabetes, does not check CBG's at home     Physical Exam:   BP 105/73  Pulse 94  Resp 18  Ht 5\' 2"  (1.575 m)  Wt 110 lb (49.896 kg)  BMI 20.11 kg/m2  SpO2 97%  LMP 01/28/2013  General:    well-appearing  HEENT:  Unremarkable   Neck:   no JVD, no bruits, no adenopathy    Chest:   clear to auscultation, symmetrical breath sounds, no wheezes, no rhonchi   CV:   RRR, grade III/VI systolic murmur   Abdomen:  soft, non-tender, no masses   Extremities:  warm, well-perfused, pulses diminished, no LE edema  Rectal/GU  Deferred  Neuro:   Grossly non-focal and symmetrical throughout  Skin:   Clean and dry, no rashes, no breakdown   Diagnostic Tests:  Transthoracic Echocardiography  (Report amended )  Patient: Hanny, Elsberry MR #: [id number] Study Date: 03/17/2013 Gender: F Age: 39 Height: 157.5cm Weight: 51.3kg BSA: 1.73m^2 Pt. Status: Room: 5511  PERFORMING [facility], Ec SONOGRAPHER Dewitt Hoes, RDCS ATTENDING Ghimire, Shanker ORDERING Tresa Endo, Prentice Docker ADMITTING Carolyne Littles cc:  ------------------------------------------------------------ LV EF: 60% - 65%  ------------------------------------------------------------ Indications: Dyspnea 786.09.  ------------------------------------------------------------ History: PMH: Murmur. Congestive heart failure. Risk factors: Tachypnea. Leukocytosis. Current tobacco use.  ------------------------------------------------------------ Study Conclusions  - Left ventricle: The cavity size was normal. Systolic function was normal. The estimated ejection fraction was in the range of 60% to 65%. Wall motion was normal; there were no regional wall motion abnormalities. Left ventricular diastolic function parameters were normal. 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 Recommend TEE for better evaluation. Mild thickening of the anterior leaflet, consistent with myxomatous proliferation. Prolapse. Moderate to severe regurgitation. Valve area by pressure half-time: 2.22cm^2. - Left atrium: The  atrium was moderately dilated. - Pericardium, extracardiac: A small, free-flowing pericardial effusion was identified circumferential to the heart. The fluid had no internal echoes.There was no evidence of hemodynamic compromise. Transthoracic echocardiography. M-mode, complete 2D, spectral Doppler, and color Doppler. Height: Height: 157.5cm. Height: 62in. Weight: Weight: 51.3kg. Weight: 112.8lb. Body mass index: BMI: 20.7kg/m^2. Body surface area: BSA: 1.28m^2. Blood pressure: 116/77. Patient status: Inpatient. Location: Bedside.  ------------------------------------------------------------  ------------------------------------------------------------ Left ventricle: The cavity size was normal. Systolic function was normal. The estimated ejection fraction was in the range of 60% to 65%. Wall motion was normal; there were no regional wall motion abnormalities. The transmitral flow pattern was normal. The deceleration time of the early transmitral flow velocity was normal. The pulmonary vein flow pattern  was normal. The tissue Doppler parameters were normal. Left ventricular diastolic function parameters were normal.  ------------------------------------------------------------ Aortic valve: Mildly thickened, mildly calcified leaflets. Cusp separation was normal. Doppler: Transvalvular velocity was within the normal range. There was no stenosis. No regurgitation.  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 In some views the anterior leaflet suggest possible vegetation but this may just be signfiant myxomatous changes. Recommend TEE for better evaluation. Mild thickening of the anterior leaflet, consistent with myxomatous proliferation. Prolapse. Doppler: Moderate to severe regurgitation. Valve area by pressure half-time: 2.22cm^2. Indexed valve area  by pressure half-time: 1.48cm^2/m^2. Mean gradient: 19mm Hg (D). Peak gradient: 38mm Hg (D).  ------------------------------------------------------------ Left atrium: The atrium was moderately dilated.  ------------------------------------------------------------ Right ventricle: The cavity size was normal. Wall thickness was normal. Systolic function was normal.  ------------------------------------------------------------ Pulmonic valve: Structurally normal valve. Cusp separation was normal. Doppler: Transvalvular velocity was within the normal range. Mild regurgitation.  ------------------------------------------------------------ Tricuspid valve: Structurally normal valve. Leaflet separation was normal. Doppler: Transvalvular velocity was within the normal range. Mild regurgitation.  ------------------------------------------------------------ Right atrium: The atrium was normal in size.  ------------------------------------------------------------ Pericardium: A small, free-flowing pericardial effusion was identified circumferential to the heart. The fluid had no internal echoes.There was no evidence of hemodynamic compromise.  ------------------------------------------------------------ Systemic veins: Inferior vena cava: The vessel was dilated; the respirophasic diameter changes were blunted (< 50%); findings are consistent with elevated central venous pressure.  ------------------------------------------------------------  2D measurements Normal Doppler measurements Normal Left ventricle Mitral valve LVID ED, 31.3 mm 43-52 Peak E vel 249 cm/s ------ chord, Peak A vel 200 cm/s ------ PLAX Mean vel, 206 cm/s ------ LVID ES, 17 mm 23-38 D chord, Decelerati 377 ms 150-23 PLAX on time 0 FS, chord, 46 % >29 Pressure 99 ms ------ PLAX half-time LVPW, ED 12.5 mm ------ Mean 19 mm Hg ------ IVS/LVPW 0.87 <1.3 gradient, ratio, ED D Ventricular septum Peak 38 mm Hg  ------ IVS, ED 10.9 mm ------ gradient, Aorta D Root diam, 22 mm ------ Peak E/A 1.2 ------ ED ratio Left atrium Area (PHT) 2.22 cm^2 ------ AP dim 52 mm ------ Area index 1.48 cm^2/m ------ AP dim 3.47 cm/m^2 <2.2 (PHT) ^2 index Annulus 77 cm ------ Vol, S 93 ml ------ VTI Vol index, 62 ml/m^2 ------ Max regurg 527 cm/s ------ S vel Regurg VTI 144 cm ------ Systemic veins Estimated 10 mm Hg ------ CVP Right ventricle Sa vel, 13.7 cm/s ------ lat ann, tiss DP  ------------------------------------------------------------ Geralynn Rile, MD 2014-06-22T15:[id number]        Transesophageal Echocardiography  Patient: Nanea, Jared MR #: [id number] Study Date: 03/18/2013 Gender: F Age: 21 Height: 157.5cm Weight: 51.4kg BSA: 1.37m^2 Pt. Status: Room: 5511  ORDERING Barrett, Rhonda REFERRING Barrett, Rhonda PERFORMING [facility] SONOGRAPHER Dewitt Hoes, RDCS ATTENDING Ghimire, Shanker ADMITTING Carolyne Littles cc:  ------------------------------------------------------------ LV EF: 60% - 65%  ------------------------------------------------------------ Indications: Endocarditis 421.9.  ------------------------------------------------------------ Study Conclusions  - Left ventricle: The cavity size was normal. Wall thickness was normal. Systolic function was normal. The estimated ejection fraction was in the range of 60% to 65%. Wall motion was normal; there were no regional wall motion abnormalities. - Aortic valve: There was no stenosis. - Aorta: Normal caliber with minimal plaque. - Mitral valve: The mitral valve was thickened and the anterior leaflet had a hockey-stick appearance. The posterior leaflet was fixed/immobile. There was severe mitral regurgitation. There was probably mild mitral stenosis (mean gradient across mitral  valve 14 mmHg but think this was elevated due to severe MR => MVA by PHT was 1.68 cm^2). There was a question of  endocarditis. The mitral valve was thickened in appearance on portions of both leaflets. This was not definitively endocarditis and the valve could have been thickened due to rheumatic heart disease. I reviewed the TTE, this actually looked more concerning for endocarditis. Effective regurgitant orifice: 0.62cm^2 (PISA). - Left atrium: The atrium was moderately dilated. No evidence of thrombus in the atrial cavity or appendage. - Right ventricle: The cavity size was normal. Systolic function was normal. - Right atrium: No evidence of thrombus in the atrial cavity or appendage. - Atrial septum: No defect or patent foramen ovale was identified. Echo contrast study showed no right-to-left atrial level shunt, at baseline or with provocation. - Tricuspid valve: Mild-moderate regurgitation. Peak RV-RA gradient: 97mm Hg (S). - Pericardium, extracardiac: Small circumferential pericardial effusion without tamponade. Transesophageal echocardiography. 2D and color Doppler. Height: Height: 157.5cm. Height: 62in. Weight: Weight: 51.4kg. Weight: 113lb. Body mass index: BMI: 20.7kg/m^2. Body surface area: BSA: 1.65m^2. Blood pressure: 134/90. Patient status: Inpatient. Location: Endoscopy.  ------------------------------------------------------------  ------------------------------------------------------------ Left ventricle: The cavity size was normal. Wall thickness was normal. Systolic function was normal. The estimated ejection fraction was in the range of 60% to 65%. Wall motion was normal; there were no regional wall motion abnormalities.  ------------------------------------------------------------ Aortic valve: Trileaflet. Doppler: There was no stenosis. No regurgitation.  ------------------------------------------------------------ Aorta: Normal caliber with minimal plaque.  ------------------------------------------------------------ Mitral valve: The mitral valve was thickened  and the anterior leaflet had a hockey-stick appearance. The posterior leaflet was fixed/immobile. There was severe mitral regurgitation. There was probably mild mitral stenosis (mean gradient across mitral valve 14 mmHg but think this was elevated due to severe MR => MVA by PHT was 1.68 cm^2). There was a question of endocarditis. The mitral valve was thickened in appearance on portions of both leaflets. This was not definitively endocarditis and the valve could have been thickened due to rheumatic heart disease. I reviewed the TTE, this actually looked more concerning for endocarditis. Doppler: Indexed valve area by pressure half-time: 1.25cm^2/m^2.  ------------------------------------------------------------ Left atrium: The atrium was moderately dilated. No evidence of thrombus in the atrial cavity or appendage.  ------------------------------------------------------------ Atrial septum: No defect or patent foramen ovale was identified. Echo contrast study showed no right-to-left atrial level shunt, at baseline or with provocation.  ------------------------------------------------------------ Right ventricle: The cavity size was normal. Systolic function was normal.  ------------------------------------------------------------ Pulmonic valve: Structurally normal valve. Cusp separation was normal.  ------------------------------------------------------------ Tricuspid valve: Doppler: Mild-moderate regurgitation.  ------------------------------------------------------------ Right atrium: The atrium was normal in size. No evidence of thrombus in the atrial cavity or appendage.  ------------------------------------------------------------ Pericardium: Small circumferential pericardial effusion without tamponade.  ------------------------------------------------------------ Post procedure conclusions Ascending Aorta:  - Normal caliber with minimal  plaque.  ------------------------------------------------------------  Doppler measurements Norma l Mitral valve Mean vel, 188 cm/s ----- D Pressure 117 ms ----- half-time Area 1.2 cm^2/m^2 ----- index 5 (PHT) Annulus 63. cm ----- VTI 7 Regurg 34. cm/s ----- alias 3 vel, PISA Max 503 cm/s ----- regurg vel Regurg 146 cm ----- VTI ERO, PISA 0.6 cm^2 ----- 2 Regurg 91 ml ----- vol, PISA Tricuspid valve Regurg 492 cm/s ----- peak vel Peak 97 mm Hg ----- RV-RA gradient, S  ------------------------------------------------------------ Prepared and Electronically Authenticated by  Marca Ancona 2014-06-30T18:14:18.120   Cardiac Catheterization Operative Report   RASEEL JANS  [id number]  6/27/20148:15 AM  No PCP Per Patient  Procedure  Performed:  1. Left Heart Catheterization 2. Selective Coronary Angiography 3. Right Heart Catheterization 4. Left ventricular angiogram 5. Distal Aortogram Operator: Verne Carrow, MD  Indication: 52 yo female admitted with CHF and found to have severe MR. LV function normal. Plans for mitral valve replacement.  Procedure Details:  The risks, benefits, complications, treatment options, and expected outcomes were discussed with the patient. The patient and/or family concurred with the proposed plan, giving informed consent. The patient was brought to the cath lab after IV hydration was begun and oral premedication was given. The patient was further sedated with Versed. The left groin was prepped and draped in the usual manner. Using the modified Seldinger access technique, a 5 French sheath was placed in the left femoral artery. A 7 French sheath was inserted into the right femoral vein. A balloon tipped catheter was used to perform a right heart catheterization. Standard diagnostic catheters were used to perform selective coronary angiography. A pigtail catheter was used to perform a left ventricular angiogram and a distal  aortogram. There were no immediate complications. The patient was taken to the recovery area in stable condition.  Hemodynamic Findings:  Ao: 97/63  LV: 100/4/10  RA: 4  RV: 43/5/6  PA: 49/21 (mean 34)  PCWP: 12  Fick Cardiac Output: 4.57 L/min  Fick Cardiac Index: 3.15 L/min/m2  Central Aortic Saturation: 95%  Pulmonary Artery Saturation: 63%  Angiographic Findings:  Left main: No obstructive disease.  Left Anterior Descending Artery: Large caliber vessel that courses to the apex. Moderate caliber diagonal branch. No obstructive disease noted.  Circumflex Artery: Moderate caliber vessel with moderate caliber OM branch. No obstructive disease.  Right Coronary Artery: Moderate caliber dominant vessel with no obstructive disease.  Left Ventricular Angiogram: LVEF 65-70%. 3+ MR  Distal Aortogram: No aneurysm. No stenosis.  Impression:  1. No angiographic evidence of CAD  2. Normal LV function.  3. Severe MR  Recommendations: Proceed with planning for MV replacement.  Complications: None; patient tolerated the procedure well.    Impression:  The patient has severe symptomatic mitral regurgitation with reserve left ventricular systolic function. 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 The patient is completing a 6 week course of empiric intravenous antibiotics for presumed bacterial endocarditis, although transesophageal echocardiogram did not reveal any vegetations and the patient did not have any other convincing signs or symptoms of bacterial endocarditis.  The patient does not have significant coronary artery disease and may be candidate for minimally invasive approach for elective mitral valve repair or replacement.   Plan:  The rationale for elective mitral valve repair surgery has been explained, including a comparison between surgery and  continued medical therapy with close follow-up.  The likelihood of successful and durable valve repair has been discussed with particular reference to the findings of their recent transesophageal echocardiogram.  Based upon these findings and previous experience, I have quoted her a less than 50 percent likelihood of successful valve repair.  In the event that their valve cannot be successfully repaired, we discussed the possibility of replacing the mitral valve using a mechanical prosthesis with the attendant need for long-term anticoagulation versus the alternative of replacing it using a bioprosthetic tissue valve with its potential for late structural valve deterioration and failure, depending upon the patient's longevity.  The patient specifically requests that if the mitral valve must be replaced that it be done using a  mechanical valve.  Alternative surgical approaches have been discussed, including a comparison between conventional sternotomy and minimally-invasive techniques.  The relative risks and benefits of each have been reviewed as they pertain to the patient's specific circumstances.  All questions have been addressed.  We will obtain CT angiogram of the chest abdomen and pelvis to evaluate vascular access for possible minimally invasive approach for surgery. The patient will return for followup on 05/20/2013 with tentative plans to proceed with surgery on 05/23/2013. We'll need to make certain that followup blood cultures are obtained once she has stopped at about therapy, although am confident this will be arranged through the infectious disease clinic. We will start the patient on amiodarone one week prior to surgery to decrease her risk of perioperative atrial dysrhythmia.    Salvatore Decent. Cornelius Moras, MD 04/22/2013 1:41 PM

## 2013-04-22 NOTE — Patient Instructions (Signed)
Begin taking amiodarone 1 week prior to surgery

## 2013-04-23 ENCOUNTER — Other Ambulatory Visit: Payer: Self-pay

## 2013-04-23 ENCOUNTER — Other Ambulatory Visit: Payer: Self-pay | Admitting: *Deleted

## 2013-04-23 DIAGNOSIS — I7100 Dissection of unspecified site of aorta: Secondary | ICD-10-CM

## 2013-04-23 DIAGNOSIS — I5031 Acute diastolic (congestive) heart failure: Secondary | ICD-10-CM

## 2013-04-23 DIAGNOSIS — I079 Rheumatic tricuspid valve disease, unspecified: Secondary | ICD-10-CM

## 2013-04-23 DIAGNOSIS — I059 Rheumatic mitral valve disease, unspecified: Secondary | ICD-10-CM

## 2013-04-24 ENCOUNTER — Encounter: Payer: Self-pay | Admitting: Internal Medicine

## 2013-04-24 ENCOUNTER — Telehealth: Payer: Self-pay | Admitting: *Deleted

## 2013-04-24 ENCOUNTER — Ambulatory Visit (INDEPENDENT_AMBULATORY_CARE_PROVIDER_SITE_OTHER): Payer: Medicaid Other | Admitting: Internal Medicine

## 2013-04-24 VITALS — BP 109/71 | HR 98 | Temp 98.1°F | Wt 111.0 lb

## 2013-04-24 DIAGNOSIS — I39 Endocarditis and heart valve disorders in diseases classified elsewhere: Secondary | ICD-10-CM

## 2013-04-24 DIAGNOSIS — I339 Acute and subacute endocarditis, unspecified: Secondary | ICD-10-CM

## 2013-04-24 NOTE — Progress Notes (Signed)
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   MRN: [id number]  HPI MS Pandey is a 52 y.o. 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 She has new onset 4 pillow orthopnea. She states that she has been increasing fatigue, nightsweats and chills,  In the ED, she is found to have temp of 100.4, lab shows WBC elevated at 12.3 a chest x-ray revealed a RML/RLL infiltrate and thus she was admitted for community acquired pneumonia txd with ctx, azithromycin. 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 Moderate to severe regurgitation. She subsequently underwent TEE On 6/23 which showed Normal LV size and systolic function, EF 60-65%. Mild to moderate TR with peak RV-RA gradient about 100 mmHg suggesting severe pulmonary hypertension. Moderate LAE. The mitral valve was thickened and the anterior leaflet had a hockey-stick appearance. The posterior leaflet was fixed/immobile. severe mitral regurgitation. Due to he mitral valve being thickened in appearance on portions of both leaflets which is concerning for SBE. she was treated for presumed endocarditis with vancomycin and ceftriaxone empirically x 6 wks. She is on day 40/42 of antibiotics with anticipation taking line out in 2 days,Thursday. She denies any difficulty with picc line. She has been finishing up her dental work and will get dentures after heart surgeries. she is now doing much better with feelings of dyspnea since having improved management of heart failure. She is anticipated  to have valvular replacement on the 28th. She has had numerous lower teeth extracted and getting dental caries fixed.   ROS: No fevers, chills, nightsweats, chest pain. No pain with infusion  Current Outpatient Prescriptions on File Prior to Visit  Medication Sig Dispense Refill  . amiodarone (PACERONE) 200 MG tablet Take 1 tablet (200 mg total) by mouth 2 (two) times daily. Begin 7 days prior to surgery.  14 tablet  0  . aspirin 81 MG chewable tablet Chew 1 tablet (81 mg total) by mouth daily.      . cefTRIAXone (ROCEPHIN) 1 G injection Inject 2 g into the muscle every 12 (twelve) hours. With 50 cc of D5W      . chlorhexidine (PERIDEX) 0.12 % solution Rinse with 15 mls twice daily for 30 seconds. Use after breakfast and at bedtime. Spit out excess. Do not swallow.  960 mL  PRN  . clotrimazole (GYNE-LOTRIMIN) 1 % vaginal cream Place 1 Applicatorful vaginally at bedtime.  45 g  0  . furosemide (LASIX) 40 MG tablet Take 1 tablet (40 mg total) by mouth daily.  30 tablet  3  . ibuprofen (ADVIL,MOTRIN) 200 MG tablet Take 400 mg by mouth every 6 (six) hours as needed for pain (Tooth pain).      . metoprolol succinate (TOPROL-XL) 50 MG 24 hr tablet Take 1 tablet (50 mg total) by mouth daily. Take with or immediately following a meal.  30 tablet  3  . vancomycin (VANCOCIN) 500 MG injection 500 mg by Other route daily. Through pic line  No current facility-administered medications on file prior to visit.   Active Ambulatory Problems    Diagnosis Date Noted  . CAP (community acquired pneumonia) 03/15/2013  . Acute diastolic heart failure 03/15/2013  . SVT (supraventricular tachycardia) 03/16/2013  . 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 Severe mitral regurgitation 04/22/2013  . Mitral valve disorders 04/22/2013  . Tricuspid regurgitation 04/22/2013   Resolved Ambulatory Problems    Diagnosis Date Noted  . Tachypnea 03/15/2013  .  Leucocytosis 03/15/2013  . Undiagnosed cardiac murmurs 03/15/2013   Past Medical History  Diagnosis Date  . Heart murmur   . Asthma   . Orthopnoea 03/15/2013  . Anemia   . History of blood transfusion   . GERD (gastroesophageal reflux disease)   . Hypertension        Review of Systems 12 point ROS is negative, except some residual tooth pain from extractions.     Objective:   Physical Exam BP 109/71  Pulse 98  Temp(Src) 98.1 F (36.7 C) (Oral)  Wt 111 lb (50.349 kg)  BMI 20.3 kg/m2  LMP 02/28/2013 Physical Exam  Constitutional:  oriented to person, place, and time. appears well-developed and well-nourished. No distress.  HENT: missing teeth in upper and lower jaw from recent extraction Mouth/Throat: Oropharynx is clear and moist. No oropharyngeal exudate.  Cardiovascular: Normal rate, regular rhythm and normal heart sounds. II/VI diastolic murmur BH LUSB no gallop and no friction rub.  Pulmonary/Chest: Effort normal and breath sounds normal. No respiratory distress.  no wheezes.  Lymphadenopathy:  no cervical adenopathy.  Neurological: He is alert and oriented to person, place, and time.  Skin: Skin is warm and dry. No rash noted. No erythema.  Psychiatric: He has a normal mood and affect. His behavior is normal.       Assessment & Plan:  Presumed endocarditis = pull out picc tomorrow after completion of antibiotics since she has finished 6 wk course of antibiotics. We will repeat blood cultures on 8/14 to ensure that she is still has no signs of bacteremia. Will also check MRSA/MSSA colonization to see if she needs to be decolonized with mupirocin prior to her surgery.

## 2013-04-24 NOTE — Telephone Encounter (Signed)
Per Dr Drue Second called [facility] to have them D/C the patients PICC 04/25/13 and pull it. I spoke with Juel Burrow and gave a verbal order.

## 2013-05-09 ENCOUNTER — Other Ambulatory Visit: Payer: Medicaid Other

## 2013-05-09 DIAGNOSIS — I39 Endocarditis and heart valve disorders in diseases classified elsewhere: Secondary | ICD-10-CM

## 2013-05-09 DIAGNOSIS — I339 Acute and subacute endocarditis, unspecified: Secondary | ICD-10-CM

## 2013-05-15 ENCOUNTER — Encounter (HOSPITAL_COMMUNITY): Payer: Self-pay | Admitting: Pharmacy Technician

## 2013-05-15 LAB — CULTURE, BLOOD (SINGLE): Organism ID, Bacteria: NO GROWTH

## 2013-05-17 ENCOUNTER — Ambulatory Visit
Admission: RE | Admit: 2013-05-17 | Discharge: 2013-05-17 | Disposition: A | Discharge status: Home / Self Care | Payer: Medicaid Other | Source: Ambulatory Visit | Attending: Thoracic Surgery (Cardiothoracic Vascular Surgery) | Admitting: Thoracic Surgery (Cardiothoracic Vascular Surgery)

## 2013-05-17 DIAGNOSIS — I5031 Acute diastolic (congestive) heart failure: Secondary | ICD-10-CM

## 2013-05-17 DIAGNOSIS — I7100 Dissection of unspecified site of aorta: Secondary | ICD-10-CM

## 2013-05-18 NOTE — Pre-Procedure Instructions (Signed)
Mary [name]  05/18/2013   Your procedure is scheduled on:  August 28  Report to [facility] at 05:30 AM.  Call this number if you have problems the morning of surgery: [phone]   Remember:   Do not eat food or drink liquids after midnight.   Take these medicines the morning of surgery with A SIP OF WATER: Amiodarone   STOP Aspirin and Ibuprofen today  Do not take Aspirin, Aleve, Naproxen, Advil, Ibuprofen, Vitamin, Herbs, or Supplements starting today  Do not wear jewelry, make-up or nail polish.  Do not wear lotions, powders, or perfumes. You may wear deodorant.  Do not shave 48 hours prior to surgery.   Do not bring valuables to the hospital.  [facility] is not responsible                   for any belongings or valuables.  Contacts, dentures or bridgework may not be worn into surgery.  Leave suitcase in the car. After surgery it may be brought to your room.  For patients admitted to the hospital, checkout time is 11:00 AM the day of  discharge.   Special Instructions: Shower using CHG 2 nights before surgery and the night before surgery.  If you shower the day of surgery use CHG.  Use special wash - you have one bottle of CHG for all showers.  You should use approximately 1/3 of the bottle for each shower.   Please read over the following fact sheets that you were given: Pain Booklet, Coughing and Deep Breathing, Blood Transfusion Information, Open Heart Packet, MRSA Information and Surgical Site Infection Prevention

## 2013-05-20 ENCOUNTER — Ambulatory Visit: Admission: RE | Admit: 2013-05-20 | Payer: Medicaid Other | Source: Ambulatory Visit

## 2013-05-20 ENCOUNTER — Inpatient Hospital Stay (HOSPITAL_COMMUNITY): Admission: RE | Admit: 2013-05-20 | Payer: Self-pay | Source: Ambulatory Visit

## 2013-05-20 ENCOUNTER — Encounter (HOSPITAL_COMMUNITY)
Admission: RE | Admit: 2013-05-20 | Discharge: 2013-05-20 | Disposition: A | Discharge status: Home / Self Care | Payer: Medicaid Other | Source: Ambulatory Visit | Attending: Thoracic Surgery (Cardiothoracic Vascular Surgery) | Admitting: Thoracic Surgery (Cardiothoracic Vascular Surgery)

## 2013-05-20 ENCOUNTER — Encounter: Payer: Self-pay | Admitting: Thoracic Surgery (Cardiothoracic Vascular Surgery)

## 2013-05-20 ENCOUNTER — Encounter (HOSPITAL_COMMUNITY): Payer: Self-pay

## 2013-05-20 ENCOUNTER — Ambulatory Visit (HOSPITAL_COMMUNITY)
Admission: RE | Admit: 2013-05-20 | Discharge: 2013-05-20 | Disposition: A | Discharge status: Home / Self Care | Payer: Medicaid Other | Source: Ambulatory Visit | Attending: Thoracic Surgery (Cardiothoracic Vascular Surgery) | Admitting: Thoracic Surgery (Cardiothoracic Vascular Surgery)

## 2013-05-20 ENCOUNTER — Other Ambulatory Visit: Payer: Self-pay | Admitting: Thoracic Surgery (Cardiothoracic Vascular Surgery)

## 2013-05-20 ENCOUNTER — Other Ambulatory Visit: Payer: Self-pay | Admitting: *Deleted

## 2013-05-20 ENCOUNTER — Ambulatory Visit
Admission: RE | Admit: 2013-05-20 | Discharge: 2013-05-20 | Disposition: A | Discharge status: Home / Self Care | Payer: Medicaid Other | Source: Ambulatory Visit | Attending: Thoracic Surgery (Cardiothoracic Vascular Surgery) | Admitting: Thoracic Surgery (Cardiothoracic Vascular Surgery)

## 2013-05-20 ENCOUNTER — Ambulatory Visit (INDEPENDENT_AMBULATORY_CARE_PROVIDER_SITE_OTHER): Payer: Medicaid Other | Admitting: Thoracic Surgery (Cardiothoracic Vascular Surgery)

## 2013-05-20 VITALS — BP 100/64 | HR 82 | Resp 18 | Ht 62.0 in | Wt 103.0 lb

## 2013-05-20 VITALS — BP 90/57 | HR 78 | Temp 98.1°F | Resp 16 | Ht 62.0 in | Wt 103.0 lb

## 2013-05-20 DIAGNOSIS — I079 Rheumatic tricuspid valve disease, unspecified: Secondary | ICD-10-CM

## 2013-05-20 DIAGNOSIS — I330 Acute and subacute infective endocarditis: Secondary | ICD-10-CM

## 2013-05-20 DIAGNOSIS — I071 Rheumatic tricuspid insufficiency: Secondary | ICD-10-CM

## 2013-05-20 DIAGNOSIS — Z87891 Personal history of nicotine dependence: Secondary | ICD-10-CM | POA: Insufficient documentation

## 2013-05-20 DIAGNOSIS — I517 Cardiomegaly: Secondary | ICD-10-CM | POA: Insufficient documentation

## 2013-05-20 DIAGNOSIS — I5031 Acute diastolic (congestive) heart failure: Secondary | ICD-10-CM

## 2013-05-20 DIAGNOSIS — D649 Anemia, unspecified: Secondary | ICD-10-CM | POA: Insufficient documentation

## 2013-05-20 DIAGNOSIS — I059 Rheumatic mitral valve disease, unspecified: Secondary | ICD-10-CM

## 2013-05-20 DIAGNOSIS — Z01812 Encounter for preprocedural laboratory examination: Secondary | ICD-10-CM | POA: Insufficient documentation

## 2013-05-20 DIAGNOSIS — K219 Gastro-esophageal reflux disease without esophagitis: Secondary | ICD-10-CM | POA: Insufficient documentation

## 2013-05-20 DIAGNOSIS — I878 Other specified disorders of veins: Secondary | ICD-10-CM

## 2013-05-20 DIAGNOSIS — Z01810 Encounter for preprocedural cardiovascular examination: Secondary | ICD-10-CM

## 2013-05-20 DIAGNOSIS — I340 Nonrheumatic mitral (valve) insufficiency: Secondary | ICD-10-CM

## 2013-05-20 DIAGNOSIS — I7100 Dissection of unspecified site of aorta: Secondary | ICD-10-CM

## 2013-05-20 DIAGNOSIS — I10 Essential (primary) hypertension: Secondary | ICD-10-CM | POA: Insufficient documentation

## 2013-05-20 DIAGNOSIS — Z01818 Encounter for other preprocedural examination: Secondary | ICD-10-CM | POA: Insufficient documentation

## 2013-05-20 DIAGNOSIS — I509 Heart failure, unspecified: Secondary | ICD-10-CM | POA: Insufficient documentation

## 2013-05-20 LAB — BLOOD GAS, ARTERIAL
Bicarbonate: 20.9 mEq/L (ref 20.0–24.0)
FIO2: 0.21 %
Patient temperature: 98.6
pCO2 arterial: 31.3 mmHg — ABNORMAL LOW (ref 35.0–45.0)
pH, Arterial: 7.44 (ref 7.350–7.450)

## 2013-05-20 LAB — COMPREHENSIVE METABOLIC PANEL
ALT: 10 U/L (ref 0–35)
Albumin: 3.8 g/dL (ref 3.5–5.2)
Alkaline Phosphatase: 77 U/L (ref 39–117)
Chloride: 102 mEq/L (ref 96–112)
Potassium: 4.3 mEq/L (ref 3.5–5.1)
Sodium: 136 mEq/L (ref 135–145)
Total Protein: 7.8 g/dL (ref 6.0–8.3)

## 2013-05-20 LAB — URINALYSIS, ROUTINE W REFLEX MICROSCOPIC
Glucose, UA: NEGATIVE mg/dL
Leukocytes, UA: NEGATIVE
Protein, ur: NEGATIVE mg/dL

## 2013-05-20 LAB — URINE MICROSCOPIC-ADD ON

## 2013-05-20 LAB — CBC
Hemoglobin: 10.8 g/dL — ABNORMAL LOW (ref 12.0–15.0)
MCHC: 33.4 g/dL (ref 30.0–36.0)
RDW: 15.9 % — ABNORMAL HIGH (ref 11.5–15.5)
WBC: 6.1 10*3/uL (ref 4.0–10.5)

## 2013-05-20 LAB — PROTIME-INR: INR: 1.12 (ref 0.00–1.49)

## 2013-05-20 LAB — SURGICAL PCR SCREEN: MRSA, PCR: NEGATIVE

## 2013-05-20 NOTE — Progress Notes (Signed)
[address]       [name] [address]             [phone]     CARDIOTHORACIC SURGERY OFFICE NOTE  Referring Provider is Hillis Range, MD PCP is No PCP Per Patient   HPI:  Patient is a 52 year old African American female who returns for followup of severe symptomatic mitral regurgitation with plans to proceed with minimally invasive mitral valve repair or replacement later this week. She was last seen here in the office 04/22/2013. She completed her course of IV antibiotics and her PICC line was removed. Subsequent followup blood cultures remained no growth.  She was scheduled for CT angiogram last week but the test was postponed until today apparently because the CT scanner was broken down. Unfortunately, earlier today her scan was postponed again because they had difficulty obtaining IV access. She is scheduled to go back for her scan first thing tomorrow.  Overall the patient is clinically doing well and she reports no new problems or complaints since her last office visit.    Current Outpatient Prescriptions  Medication Sig Dispense Refill  . amiodarone (PACERONE) 200 MG tablet Take 1 tablet (200 mg total) by mouth 2 (two) times daily. Begin 7 days prior to surgery.  14 tablet  0  . aspirin 81 MG chewable tablet Chew 1 tablet (81 mg total) by mouth daily.      . chlorhexidine (PERIDEX) 0.12 % solution Rinse with 15 mls twice daily for 30 seconds. Use after breakfast and at bedtime. Spit out excess. Do not swallow.  960 mL  PRN  . furosemide (LASIX) 40 MG tablet Take 1 tablet (40 mg total) by mouth daily.  30 tablet  3  . ibuprofen (ADVIL,MOTRIN) 200 MG tablet Take 400 mg by mouth every 6 (six) hours as needed for pain.        No current facility-administered medications for this visit.      Physical Exam:   BP 100/64  Pulse 82  Resp 18  Ht 5\' 2"  (1.575 m)  Wt 103 lb (46.72 kg)  BMI 18.83 kg/m2  SpO2 98%  LMP  01/28/2013  General:  Well-appearing  Chest:   Clear to auscultation  CV:   Regular rate and rhythm with systolic murmur  Incisions:  n/a  Abdomen:  Soft and nontender  Extremities:  Warm and well-perfused  Diagnostic Tests:  n/a   Impression:  The patient has severe symptomatic mitral regurgitation with reserve left ventricular systolic function. 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 The patient has completed a 6 week course of empiric intravenous antibiotics for presumed bacterial endocarditis, although transesophageal echocardiogram did not reveal any vegetations and the patient did not have any other convincing signs or symptoms of bacterial endocarditis.  Followup blood cultures obtained since antibiotics were discontinued are no growth.  The patient does not have significant coronary artery disease and may be candidate for minimally invasive approach for elective mitral valve repair or replacement.   Plan:  I again reviewed the indications, risks, and potential benefits of surgery with the patient and her sister in the office today. We plan to proceed with surgery later this week. We will followup results of her CT angiogram that has now been scheduled for tomorrow. All of her questions been addressed.   Salvatore Decent. Cornelius Moras, MD 05/20/2013  2:46 PM

## 2013-05-20 NOTE — H&P (Addendum)
[address]       [name] [address]             [phone]          CARDIOTHORACIC SURGERY HISTORY AND PHYSICAL EXAM  Referring Provider is Hillis Range, MD PCP is No PCP Per Patient    Chief Complaint   Patient presents with   .  Mitral Regurgitation       F/U from Inpt hospital consult, TEE 03/18/13, Cardiac Cath 03/22/13     HPI:  Patient is a 52 year old [place] American female who returns for followup of severe symptomatic mitral regurgitation. She was originally seen in consultation by Dr. Laneta Simmers and myself during her recent hospitalization for acute exacerbation of congestive heart failure.  She was admitted to the hospital 03/15/2013 with resting shortness of breath, orthopnea, lower extremity edema, fever and chills. She was initially treated with empiric antibiotics for presumed community acquired pneumonia. She was noted to have a prominent systolic murmur on physical exam and an echocardiogram demonstrated severe mitral regurgitation. There was suggestion of the presence of a vegetation on the anterior leaflet of the mitral valve by transthoracic echocardiogram, and subsequently patient was treated for the presumptive diagnosis of bacterial endocarditis. Blood cultures have always remained negative, although apparently none were obtained prior to initiation of antibiotic therapy.  Transesophageal echocardiogram performed by Dr. 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 There was no clear vegetation identified.  Her symptoms of congestive heart failure improved rapidly with diuretic therapy.  Left and right heart catheterization was notable for the absence of significant coronary artery disease and confirmed the presence of severe mitral regurgitation. There was moderate pulmonary hypertension. Plans were made at that time to allow the patient to complete her course of empiric  antibiotics and check followup with cultures once antibiotic therapy was discontinued.  Since hospital discharge the patient has also been seen by Dr. [name] in the dental clinic and undergone dental extraction. She was last seen here in the office 04/22/2013. She completed her course of IV antibiotics and her PICC line was removed. Subsequent followup blood cultures remained no growth.  She was scheduled for CT angiogram last week but the test was postponed until today apparently because the CT scanner was broken down. Unfortunately, earlier today her scan was postponed again because they had difficulty obtaining IV access. She is scheduled to go back for her scan first thing tomorrow.  Overall the patient is clinically doing well and she reports no new problems or complaints since her last office visit.  Since hospital discharge the patient reports remaining clinically stable. She still has exertional shortness of breath, particularly when she goes up and down a flight of stairs. She denies resting shortness of breath, PND, orthopnea, or lower extremity edema. She's not had any fevers or chills or night sweats. She has mild pain in both knees.   Past Medical History  Diagnosis Date  . Heart murmur   . CAP (community acquired pneumonia) 03/15/2013    "maybe; that's what they are thinking today" (03/15/2013)  . Asthma     "as a baby"   . Orthopnoea 03/15/2013    "in the last 2 days" (03/15/2013)  . Anemia   . History of blood transfusion     "14 w/1st pregnancy; 2 w/last C-section" (03/15/2013)  . GERD (gastroesophageal reflux disease)   . Hypertension  no pcp   will go to [facility]  . Severe mitral regurgitation 04/22/2013  . SVT (supraventricular tachycardia) 03/16/2013  . Acute diastolic heart failure 03/15/2013  . 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 Tricuspid regurgitation     Past Surgical History  Procedure Laterality Date  . Appendectomy  ~1978  . Cesarean section   1983; 1999  . Cholecystectomy  2000's  . Tubal ligation  1999  . Tee without cardioversion N/A 03/18/2013    Procedure: TRANSESOPHAGEAL ECHOCARDIOGRAM (TEE);  Surgeon: Laurey Morale, MD;  Location: [facility] ENDOSCOPY;  Service: Cardiovascular;  Laterality: N/A;  . Cardiac catheterization    . Multiple extractions with alveoloplasty N/A 04/04/2013    Procedure: Extraction of tooth #'s 1,8,9,13,14,15,23,24,25,26 with alveoloplasty and gross debridement of remaining teeth;  Surgeon: Charlynne Pander, DDS;  Location: [facility] OR;  Service: Oral Surgery;  Laterality: N/A;    No family history on file.  Social History History  Substance Use Topics  . Smoking status: Current Every Day Smoker -- 0.50 packs/day for 36 years    Types: Cigarettes    Last Attempt to Quit: 03/15/2013  . Smokeless tobacco: Never Used  . Alcohol Use: 3.6 oz/week    6 Glasses of wine per week     Comment: 03/15/2013 "bottle of wine/wk"    Prior to Admission medications   Medication Sig Start Date End Date Taking? Authorizing Provider  aspirin 81 MG chewable tablet Chew 1 tablet (81 mg total) by mouth daily. 03/26/13  Yes Marianne L York, PA-C  chlorhexidine (PERIDEX) 0.12 % solution Rinse with 15 mls twice daily for 30 seconds. Use after breakfast and at bedtime. Spit out excess. Do not swallow. 04/18/13  Yes Charlynne Pander, DDS  furosemide (LASIX) 40 MG tablet Take 1 tablet (40 mg total) by mouth daily. 03/26/13  Yes Marianne L York, PA-C  ibuprofen (ADVIL,MOTRIN) 200 MG tablet Take 400 mg by mouth every 6 (six) hours as needed for pain.    Yes Historical Provider, MD  amiodarone (PACERONE) 200 MG tablet Take 1 tablet (200 mg total) by mouth 2 (two) times daily. Begin 7 days prior to surgery. 04/22/13   Purcell Nails, MD    Allergies  Allergen Reactions  . Oxycodone Itching      Review of Systems:              General:                      normal appetite, normal energy, no weight gain, no weight loss, no fever              Cardiac:                      no chest pain with exertion, no chest pain at rest, + SOB with exertion, no resting SOB, no PND, no orthopnea, no palpitations, no arrhythmia, no atrial fibrillation, no LE edema, no dizzy spells, no syncope             Respiratory:                + exertional shortness of breath, no home oxygen, no productive cough, no dry cough, no bronchitis, no wheezing, no hemoptysis, no asthma, no pain with inspiration or cough, no sleep apnea, no CPAP at night             GI:  no difficulty swallowing, no reflux, no frequent heartburn, no hiatal hernia, no abdominal pain, no constipation, no diarrhea, no hematochezia, no hematemesis, no melena             GU:                              no dysuria,  no frequency, no urinary tract infection, no hematuria, no kidney stones, no kidney disease             Vascular:                     no pain suggestive of claudication, no pain in feet, no leg cramps, no varicose veins, no DVT, no non-healing foot ulcer             Neuro:                         no stroke, no TIA's, no seizures, no headaches, no temporary blindness one eye,  no slurred speech, no peripheral neuropathy, no chronic pain, no instability of gait, no memory/cognitive dysfunction             Musculoskeletal:         + arthritis, no joint swelling, no myalgias, no difficulty walking, normal mobility               Skin:                            no rash, no itching, no skin infections, no pressure sores or ulcerations             Psych:                         no anxiety, no depression, no nervousness, no unusual recent stress             Eyes:                           no blurry vision, no floaters, no recent vision changes, does not wears glasses or contacts             ENT:                            no hearing loss, no loose or painful teeth, last saw dentist last week             Hematologic:               no easy bruising, no abnormal  bleeding, no  clotting disorder, no frequent epistaxis             Endocrine:                   no diabetes, does not check CBG's at home                           Physical Exam:              BP 105/73  Pulse 94  Resp 18  Ht 5\' 2"  (1.575 m)  Wt 110 lb (49.896 kg)  BMI 20.11 kg/m2  SpO2 97%  LMP 01/28/2013  General:                        well-appearing             HEENT:                       Unremarkable               Neck:                           no JVD, no bruits, no adenopathy               Chest:                         clear to auscultation, symmetrical breath sounds, no wheezes, no rhonchi               CV:                              RRR, grade III/VI systolic murmur               Abdomen:                    soft, non-tender, no masses               Extremities:                 warm, well-perfused, pulses diminished, no LE edema             Rectal/GU                   Deferred             Neuro:                         Grossly non-focal and symmetrical throughout             Skin:                            Clean and dry, no rashes, no breakdown   Diagnostic Tests:    Transthoracic Echocardiography  (Report amended )  Patient: Mary [name], Mary [name] MR #: [id number] Study Date: 03/17/2013 Gender: F Age: 65 Height: 157.5cm Weight: 51.3kg BSA: 1.96m^2 Pt. Status: Room: 5511  PERFORMING [facility] SONOGRAPHER Dewitt Hoes, RDCS ATTENDING Ghimire, Shanker ORDERING Tresa Endo, Prentice Docker ADMITTING Carolyne Littles cc:  ------------------------------------------------------------ LV EF: 60% - 65%  ------------------------------------------------------------ Indications: Dyspnea 786.09.  ------------------------------------------------------------ History: PMH: Murmur. Congestive heart failure. Risk factors: Tachypnea. Leukocytosis. Current tobacco use.  ------------------------------------------------------------ Study  Conclusions  - Left ventricle: The cavity size was normal. Systolic function was normal. The estimated ejection fraction was in the range of 60% to 65%. Wall motion was normal; there were no regional wall motion abnormalities. Left ventricular diastolic function parameters were normal. 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 Recommend TEE for better evaluation. Mild thickening of the anterior leaflet, consistent with myxomatous proliferation. Prolapse. Moderate to severe regurgitation. Valve area by pressure half-time:  2.22cm^2. - Left atrium: The atrium was moderately dilated. - Pericardium, extracardiac: A small, free-flowing pericardial effusion was identified circumferential to the heart. The fluid had no internal echoes.There was no evidence of hemodynamic compromise. Transthoracic echocardiography. M-mode, complete 2D, spectral Doppler, and color Doppler. Height: Height: 157.5cm. Height: 62in. Weight: Weight: 51.3kg. Weight: 112.8lb. Body mass index: BMI: 20.7kg/m^2. Body surface area: BSA: 1.89m^2. Blood pressure: 116/77. Patient status: Inpatient. Location: Bedside.  ------------------------------------------------------------  ------------------------------------------------------------ Left ventricle: The cavity size was normal. Systolic function was normal. The estimated ejection fraction was in the range of 60% to 65%. Wall motion was normal; there were no regional wall motion abnormalities. The transmitral flow pattern was normal. The deceleration time of the early transmitral flow velocity was normal. The pulmonary vein flow pattern was normal. The tissue Doppler parameters were normal. Left ventricular diastolic function parameters  were normal.  ------------------------------------------------------------ Aortic valve: Mildly thickened, mildly calcified leaflets. Cusp separation was normal. Doppler: Transvalvular velocity was within the normal range. There was no stenosis. No regurgitation.  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 In some views the anterior leaflet suggest possible vegetation but this may just be signfiant myxomatous changes. Recommend TEE for better evaluation. Mild thickening of the anterior leaflet, consistent with myxomatous proliferation. Prolapse. Doppler: Moderate to severe regurgitation. Valve area by pressure half-time: 2.22cm^2. Indexed valve area by pressure half-time: 1.48cm^2/m^2. Mean gradient: 19mm Hg (D). Peak gradient: 38mm Hg (D).  ------------------------------------------------------------ Left atrium: The atrium was moderately dilated.  ------------------------------------------------------------ Right ventricle: The cavity size was normal. Wall thickness was normal. Systolic function was normal.  ------------------------------------------------------------ Pulmonic valve: Structurally normal valve. Cusp separation was normal. Doppler: Transvalvular velocity was within the normal range. Mild regurgitation.  ------------------------------------------------------------ Tricuspid valve: Structurally normal valve. Leaflet separation was normal. Doppler: Transvalvular velocity was within the normal range. Mild regurgitation.  ------------------------------------------------------------ Right atrium: The atrium was normal in size.  ------------------------------------------------------------ Pericardium: A small, free-flowing pericardial effusion was identified circumferential to the heart. The fluid had no internal echoes.There was no evidence of  hemodynamic compromise.  ------------------------------------------------------------ Systemic veins: Inferior vena cava: The vessel was dilated; the respirophasic diameter changes were blunted (< 50%); findings are consistent with elevated central venous pressure.  ------------------------------------------------------------  2D measurements Normal Doppler measurements Normal Left ventricle Mitral valve LVID ED, 31.3 mm 43-52 Peak E vel 249 cm/s ------ chord, Peak A vel 200 cm/s ------ PLAX Mean vel, 206 cm/s ------ LVID ES, 17 mm 23-38 D chord, Decelerati 377 ms 150-23 PLAX on time 0 FS, chord, 46 % >29 Pressure 99 ms ------ PLAX half-time LVPW, ED 12.5 mm ------ Mean 19 mm Hg ------ IVS/LVPW 0.87 <1.3 gradient, ratio, ED D Ventricular septum Peak 38 mm Hg ------ IVS, ED 10.9 mm ------ gradient, Aorta D Root diam, 22 mm ------ Peak E/A 1.2 ------ ED ratio Left atrium Area (PHT) 2.22 cm^2 ------ AP dim 52 mm ------ Area index 1.48 cm^2/m ------ AP dim 3.47 cm/m^2 <2.2 (PHT) ^2 index Annulus 77 cm ------ Vol, S 93 ml ------ VTI Vol index, 62 ml/m^2 ------ Max regurg 527 cm/s ------ S vel Regurg VTI 144 cm ------ Systemic veins Estimated 10 mm Hg ------ CVP Right ventricle Sa vel, 13.7 cm/s ------ lat ann, tiss DP  ------------------------------------------------------------ Geralynn Rile, MD 2014-06-22T15:[id number]            Transesophageal Echocardiography  Patient: Mary [name], Mary [name] MR #: [id number] Study Date: 03/18/2013 Gender: F Age: 66  Height: 157.5cm Weight: 51.4kg BSA: 1.61m^2 Pt. Status: Room: 5511  ORDERING Barrett, Rhonda REFERRING Barrett, Rhonda PERFORMING [facility]cona SONOGRAPHER Dewitt Hoes, RDCS ATTENDING Ghimire, Shanker ADMITTING Carolyne Littles cc:  ------------------------------------------------------------ LV EF: 60% - 65%  ------------------------------------------------------------ Indications: Endocarditis  421.9.  ------------------------------------------------------------ Study Conclusions  - Left ventricle: The cavity size was normal. Wall thickness was normal. Systolic function was normal. The estimated ejection fraction was in the range of 60% to 65%. Wall motion was normal; there were no regional wall motion abnormalities. - Aortic valve: There was no stenosis. - Aorta: Normal caliber with minimal plaque. - Mitral valve: The mitral valve was thickened and the anterior leaflet had a hockey-stick appearance. The posterior leaflet was fixed/immobile. There was severe mitral regurgitation. There was probably mild mitral stenosis (mean gradient across mitral valve 14 mmHg but think this was elevated due to severe MR => MVA by PHT was 1.68 cm^2). There was a question of endocarditis. The mitral valve was thickened in appearance on portions of both leaflets. This was not definitively endocarditis and the valve could have been thickened due to rheumatic heart disease. I reviewed the TTE, this actually looked more concerning for endocarditis. Effective regurgitant orifice: 0.62cm^2 (PISA). - Left atrium: The atrium was moderately dilated. No evidence of thrombus in the atrial cavity or appendage. - Right ventricle: The cavity size was normal. Systolic function was normal. - Right atrium: No evidence of thrombus in the atrial cavity or appendage. - Atrial septum: No defect or patent foramen ovale was identified. Echo contrast study showed no right-to-left atrial level shunt, at baseline or with provocation. - Tricuspid valve: Mild-moderate regurgitation. Peak RV-RA gradient: 97mm Hg (S). - Pericardium, extracardiac: Small circumferential pericardial effusion without tamponade. Transesophageal echocardiography. 2D and color Doppler. Height: Height: 157.5cm. Height: 62in. Weight: Weight: 51.4kg. Weight: 113lb. Body mass index: BMI: 20.7kg/m^2. Body surface area: BSA: 1.38m^2. Blood  pressure: 134/90. Patient status: Inpatient. Location: Endoscopy.  ------------------------------------------------------------  ------------------------------------------------------------ Left ventricle: The cavity size was normal. Wall thickness was normal. Systolic function was normal. The estimated ejection fraction was in the range of 60% to 65%. Wall motion was normal; there were no regional wall motion abnormalities.  ------------------------------------------------------------ Aortic valve: Trileaflet. Doppler: There was no stenosis. No regurgitation.  ------------------------------------------------------------ Aorta: Normal caliber with minimal plaque.  ------------------------------------------------------------ Mitral valve: The mitral valve was thickened and the anterior leaflet had a hockey-stick appearance. The posterior leaflet was fixed/immobile. There was severe mitral regurgitation. There was probably mild mitral stenosis (mean gradient across mitral valve 14 mmHg but think this was elevated due to severe MR => MVA by PHT was 1.68 cm^2). There was a question of endocarditis. The mitral valve was thickened in appearance on portions of both leaflets. This was not definitively endocarditis and the valve could have been thickened due to rheumatic heart disease. I reviewed the TTE, this actually looked more concerning for endocarditis. Doppler: Indexed valve area by pressure half-time: 1.25cm^2/m^2.  ------------------------------------------------------------ Left atrium: The atrium was moderately dilated. No evidence of thrombus in the atrial cavity or appendage.  ------------------------------------------------------------ Atrial septum: No defect or patent foramen ovale was identified. Echo contrast study showed no right-to-left atrial level shunt, at baseline or with provocation.  ------------------------------------------------------------ Right  ventricle: The cavity size was normal. Systolic function was normal.  ------------------------------------------------------------ Pulmonic valve: Structurally normal valve. Cusp separation was normal.  ------------------------------------------------------------ Tricuspid valve: Doppler: Mild-moderate regurgitation.  ------------------------------------------------------------ Right atrium: The atrium was normal in size. No evidence of thrombus in the atrial cavity or appendage.  ------------------------------------------------------------  Pericardium: Small circumferential pericardial effusion without tamponade.  ------------------------------------------------------------ Post procedure conclusions Ascending Aorta:  - Normal caliber with minimal plaque.  ------------------------------------------------------------  Doppler measurements Norma l Mitral valve Mean vel, 188 cm/s ----- D Pressure 117 ms ----- half-time Area 1.2 cm^2/m^2 ----- index 5 (PHT) Annulus 63. cm ----- VTI 7 Regurg 34. cm/s ----- alias 3 vel, PISA Max 503 cm/s ----- regurg vel Regurg 146 cm ----- VTI ERO, PISA 0.6 cm^2 ----- 2 Regurg 91 ml ----- vol, PISA Tricuspid valve Regurg 492 cm/s ----- peak vel Peak 97 mm Hg ----- RV-RA gradient, S  ------------------------------------------------------------ Prepared and Electronically Authenticated by  Marca Ancona 2014-06-30T18:14:[id number]   Cardiac Catheterization Operative Report    Mary [name]   [id number]   6/27/20148:15 AM   No PCP Per Patient   Procedure Performed:  1. Left Heart Catheterization 2. Selective Coronary Angiography 3. Right Heart Catheterization 4. Left ventricular angiogram 5. Distal Aortogram Operator: Verne Carrow, MD  Indication: 52 yo female admitted with CHF and found to have severe MR. LV function normal. Plans for mitral valve replacement.   Procedure Details:   The risks, benefits,  complications, treatment options, and expected outcomes were discussed with the patient. The patient and/or family concurred with the proposed plan, giving informed consent. The patient was brought to the cath lab after IV hydration was begun and oral premedication was given. The patient was further sedated with Versed. The left groin was prepped and draped in the usual manner. Using the modified Seldinger access technique, a 5 French sheath was placed in the left femoral artery. A 7 French sheath was inserted into the right femoral vein. A balloon tipped catheter was used to perform a right heart catheterization. Standard diagnostic catheters were used to perform selective coronary angiography. A pigtail catheter was used to perform a left ventricular angiogram and a distal aortogram. There were no immediate complications. The patient was taken to the recovery area in stable condition.   Hemodynamic Findings:   Ao: 97/63  LV: 100/4/10  RA: 4  RV: 43/5/6  PA: 49/21 (mean 34)   PCWP: 12  Fick Cardiac Output: 4.57 L/min   Fick Cardiac Index: 3.15 L/min/m2   Central Aortic Saturation: 95%   Pulmonary Artery Saturation: 63%   Angiographic Findings:   Left main: No obstructive disease.   Left Anterior Descending Artery: Large caliber vessel that courses to the apex. Moderate caliber diagonal branch. No obstructive disease noted.   Circumflex Artery: Moderate caliber vessel with moderate caliber OM branch. No obstructive disease.   Right Coronary Artery: Moderate caliber dominant vessel with no obstructive disease.   Left Ventricular Angiogram: LVEF 65-70%. 3+ MR   Distal Aortogram: No aneurysm. No stenosis.   Impression:  1. No angiographic evidence of CAD   2. Normal LV function.   3. Severe MR   Recommendations: Proceed with planning for MV replacement.  Complications: None; patient tolerated the procedure well.    Impression:  The patient has severe symptomatic mitral regurgitation with  reserve left ventricular systolic function. 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 The patient has completed a 6 week course of empiric intravenous antibiotics for presumed bacterial endocarditis, although transesophageal echocardiogram did not reveal any vegetations and the patient did not have any other convincing signs or symptoms of bacterial endocarditis.  Followup blood cultures obtained since antibiotics were discontinued are no growth.  The  patient does not have significant coronary artery disease and may be candidate for minimally invasive approach for elective mitral valve repair or replacement.    Plan:  The rationale for elective mitral valve repair surgery has been explained, including a comparison between surgery and continued medical therapy with close follow-up.  The likelihood of successful and durable valve repair has been discussed with particular reference to the findings of their recent transesophageal echocardiogram.  Based upon these findings and previous experience, I have quoted her a less than 50 percent likelihood of successful valve repair.  In the event that their valve cannot be successfully repaired, we discussed the possibility of replacing the mitral valve using a mechanical prosthesis with the attendant need for long-term anticoagulation versus the alternative of replacing it using a bioprosthetic tissue valve with its potential for late structural valve deterioration and failure, depending upon the patient's longevity.  The patient specifically requests that if the mitral valve must be replaced that it be done using a mechanical valve.  Alternative surgical approaches have been discussed, including a comparison between conventional sternotomy and minimally-invasive techniques.  The relative risks and benefits of each have been reviewed  as they pertain to the patient's specific circumstances. All questions have been addressed.  We await CT angiogram of the chest abdomen and pelvis to evaluate vascular access for possible minimally invasive approach for surgery. The patient was started on amiodarone prophylactically 1 week prior to surgery.     Salvatore Decent. Cornelius Moras, MD

## 2013-05-20 NOTE — Progress Notes (Signed)
VASCULAR LAB PRELIMINARY  PRELIMINARY  PRELIMINARY  PRELIMINARY  Pre-op Cardiac Surgery  Carotid Findings:  Bilateral:  1-39% ICA stenosis.  Vertebral artery flow is antegrade.     Upper Extremity Right Left  Brachial Pressures 104 Triphasic 95 Triphasic  Radial Waveforms Triphasic Biphasic  Ulnar Waveforms Triphasic Triphasic  Palmar Arch (Allen's Test) Normal Abnormal   Findings:  Doppler waveforms on the right remained normal with both radial and ulnar compressions. Left Doppler waveforms remained normal with radial compression and obliterated with ulnar compressions    Ricard Faulkner, RVS 05/20/2013, 1:33 PM

## 2013-05-20 NOTE — Progress Notes (Signed)
Ryan at Dr Orvan July office made aware that patient did not start Amiodarone until Sunday 8/24 instead of Thursday 8/21. After notifying Dr Cornelius Moras stated okay for surgery.

## 2013-05-21 ENCOUNTER — Ambulatory Visit
Admission: RE | Admit: 2013-05-21 | Discharge: 2013-05-21 | Disposition: A | Discharge status: Home / Self Care | Payer: Medicaid Other | Source: Ambulatory Visit | Attending: Thoracic Surgery (Cardiothoracic Vascular Surgery) | Admitting: Thoracic Surgery (Cardiothoracic Vascular Surgery)

## 2013-05-21 ENCOUNTER — Other Ambulatory Visit: Payer: Self-pay

## 2013-05-21 DIAGNOSIS — I5031 Acute diastolic (congestive) heart failure: Secondary | ICD-10-CM

## 2013-05-21 DIAGNOSIS — I7100 Dissection of unspecified site of aorta: Secondary | ICD-10-CM

## 2013-05-21 DIAGNOSIS — I878 Other specified disorders of veins: Secondary | ICD-10-CM

## 2013-05-21 MED ORDER — IOHEXOL 350 MG/ML SOLN
80.0000 mL | Freq: Once | INTRAVENOUS | Status: AC | PRN
  Administered 2013-05-21: 80 mL via INTRAVENOUS

## 2013-05-21 NOTE — Progress Notes (Signed)
Anesthesia chart review: Patient is a 52 year old female scheduled for mitral valve repair/replacement, possible TV repair on 05/23/13 by Dr. Cornelius Moras.  She is recently s/p multiple teeth extractions with alveoloplasty and preprosthetic surgery on 04/04/13. 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 During her June hospitalization she also developed paroxsymal SVT in the setting of acute diastolic CHF. Other history includes anemia, GERD, former smoker, HTN, childhood asthma.  She was seen by [facility] Cardiologist Dr. Johney Frame and Dr. Clifton James in the recent past.  No PCP is listed.   TEE on 03/18/13 showed normal LV systolic function, EF 60-65%, normal LV wall motion. Mitral valve with ". The anterior leaflet had a hockey stick appearance. The posterior leaflet was fixed/immobile. There was severe mitral regurgitation. There was probable mild mitral stenosis (the mean gradient across the mitral valve 14 mmHg but thought to be elevated due to severe MR => MVA by PHT was 1.68 cm^2. Likely endocarditis although mitral valve couldn't be can be 2 rheumatic heart disease. Moderately dilated left atrium. Mild to moderate tricuspid regurgitation. Small circumferential pericardial effusion without cancer.   Cardiac cath on 03/15/2013 showed no angiographic evidence of CAD, normal LV function with EF 65-70%, severe mitral regurgitation.   EKG on 05/20/13 showed NSR, LAE, incomplete right BBB, low voltage QRS, inferior infarct (age undetermined), T wave abnormality, consider anterior ischemia.   Carotid duplex on 05/20/13 showed 1-39% bilateral ICA stenosis, antegrade vertebral artery flow.  CXR on 05/20/13 showed stable cardiomegaly, no acute abnormalities.  CTA of the chest, abdomen and pelvis from 05/21/13 showed: Chest: No evidence of aortic aneurysm or dissection. No significant intrinsic disease. Mild  COPD. ABD/PELVIS:  No evidence of aortic aneurysm or dissection. No significant intrinsic disease. Small amount of free fluid in the pelvis.  Preoperative labs from 05/20/13 noted.  Short Stay nursing staff asked to inquire if she needs a pregnancy test on the day of surgery.  Velna Ochs [facility] Short Stay Center/Anesthesiology Phone [phone] 05/21/2013 11:16 AM

## 2013-05-22 ENCOUNTER — Encounter (HOSPITAL_COMMUNITY): Payer: Self-pay | Admitting: Certified Registered Nurse Anesthetist

## 2013-05-22 MED ORDER — POTASSIUM CHLORIDE 2 MEQ/ML IV SOLN
80.0000 meq | INTRAVENOUS | Status: DC
  Filled 2013-05-22: qty 40

## 2013-05-22 MED ORDER — MAGNESIUM SULFATE 50 % IJ SOLN
40.0000 meq | INTRAMUSCULAR | Status: DC
  Filled 2013-05-22: qty 10

## 2013-05-22 MED ORDER — DEXTROSE 5 % IV SOLN
0.5000 ug/min | INTRAVENOUS | Status: DC
  Filled 2013-05-22: qty 4

## 2013-05-22 MED ORDER — DOPAMINE-DEXTROSE 3.2-5 MG/ML-% IV SOLN
2.0000 ug/kg/min | INTRAVENOUS | Status: DC
  Filled 2013-05-22: qty 250

## 2013-05-22 MED ORDER — PLASMA-LYTE 148 IV SOLN
INTRAVENOUS | Status: DC
  Filled 2013-05-22: qty 2.5

## 2013-05-22 MED ORDER — NITROGLYCERIN IN D5W 200-5 MCG/ML-% IV SOLN
2.0000 ug/min | INTRAVENOUS | Status: AC
  Administered 2013-05-23: 5 ug/min via INTRAVENOUS
  Filled 2013-05-22: qty 250

## 2013-05-22 MED ORDER — DEXMEDETOMIDINE HCL IN NACL 400 MCG/100ML IV SOLN
0.1000 ug/kg/h | INTRAVENOUS | Status: AC
  Administered 2013-05-23: 0.3 ug/kg/h via INTRAVENOUS
  Filled 2013-05-22: qty 100

## 2013-05-22 MED ORDER — DEXTROSE 5 % IV SOLN
750.0000 mg | INTRAVENOUS | Status: DC
  Filled 2013-05-22: qty 750

## 2013-05-22 MED ORDER — PHENYLEPHRINE HCL 10 MG/ML IJ SOLN
30.0000 ug/min | INTRAVENOUS | Status: AC
  Administered 2013-05-23: 20 ug/min via INTRAVENOUS
  Filled 2013-05-22: qty 2

## 2013-05-22 MED ORDER — SODIUM CHLORIDE 0.9 % IV SOLN
INTRAVENOUS | Status: AC
  Administered 2013-05-23: 1.5 [IU]/h via INTRAVENOUS
  Filled 2013-05-22: qty 1

## 2013-05-22 MED ORDER — SODIUM CHLORIDE 0.9 % IV SOLN
INTRAVENOUS | Status: AC
  Administered 2013-05-23: 69.8 mL/h via INTRAVENOUS
  Filled 2013-05-22: qty 40

## 2013-05-22 MED ORDER — VANCOMYCIN HCL 1000 MG IV SOLR
INTRAVENOUS | Status: AC
  Administered 2013-05-23: 10:00:00
  Filled 2013-05-22 (×2): qty 1000

## 2013-05-22 MED ORDER — DEXTROSE 5 % IV SOLN
1.5000 g | INTRAVENOUS | Status: AC
  Administered 2013-05-23: .75 g via INTRAVENOUS
  Administered 2013-05-23: 1.5 g via INTRAVENOUS
  Filled 2013-05-22 (×2): qty 1.5

## 2013-05-22 MED ORDER — VANCOMYCIN HCL 10 G IV SOLR
1250.0000 mg | INTRAVENOUS | Status: AC
  Administered 2013-05-23: 1250 mg via INTRAVENOUS
  Filled 2013-05-22: qty 1250

## 2013-05-22 MED ORDER — SODIUM CHLORIDE 0.9 % IV SOLN
INTRAVENOUS | Status: DC
  Filled 2013-05-22: qty 30

## 2013-05-23 ENCOUNTER — Encounter (HOSPITAL_COMMUNITY): Payer: Self-pay | Admitting: *Deleted

## 2013-05-23 ENCOUNTER — Inpatient Hospital Stay (HOSPITAL_COMMUNITY)
Admission: RE | Admit: 2013-05-23 | Discharge: 2013-06-04 | DRG: 220 | Disposition: A | Discharge status: Home / Self Care | Payer: Medicaid Other | Source: Ambulatory Visit | Attending: Thoracic Surgery (Cardiothoracic Vascular Surgery) | Admitting: Thoracic Surgery (Cardiothoracic Vascular Surgery)

## 2013-05-23 ENCOUNTER — Ambulatory Visit (HOSPITAL_COMMUNITY): Payer: Medicaid Other | Admitting: Certified Registered Nurse Anesthetist

## 2013-05-23 ENCOUNTER — Inpatient Hospital Stay (HOSPITAL_COMMUNITY): Payer: Medicaid Other

## 2013-05-23 ENCOUNTER — Encounter (HOSPITAL_COMMUNITY)
Admission: RE | Disposition: A | Discharge status: Home / Self Care | Payer: Self-pay | Source: Ambulatory Visit | Attending: Thoracic Surgery (Cardiothoracic Vascular Surgery)

## 2013-05-23 ENCOUNTER — Encounter (HOSPITAL_COMMUNITY): Payer: Self-pay | Admitting: Vascular Surgery

## 2013-05-23 DIAGNOSIS — I498 Other specified cardiac arrhythmias: Secondary | ICD-10-CM | POA: Diagnosis present

## 2013-05-23 DIAGNOSIS — I079 Rheumatic tricuspid valve disease, unspecified: Secondary | ICD-10-CM | POA: Diagnosis present

## 2013-05-23 DIAGNOSIS — I2789 Other specified pulmonary heart diseases: Secondary | ICD-10-CM | POA: Diagnosis present

## 2013-05-23 DIAGNOSIS — F17200 Nicotine dependence, unspecified, uncomplicated: Secondary | ICD-10-CM | POA: Diagnosis present

## 2013-05-23 DIAGNOSIS — IMO0001 Reserved for inherently not codable concepts without codable children: Secondary | ICD-10-CM | POA: Diagnosis present

## 2013-05-23 DIAGNOSIS — I4891 Unspecified atrial fibrillation: Secondary | ICD-10-CM | POA: Diagnosis present

## 2013-05-23 DIAGNOSIS — K4190 Unilateral femoral hernia, without obstruction or gangrene, not specified as recurrent: Secondary | ICD-10-CM

## 2013-05-23 DIAGNOSIS — I5032 Chronic diastolic (congestive) heart failure: Secondary | ICD-10-CM | POA: Diagnosis present

## 2013-05-23 DIAGNOSIS — I442 Atrioventricular block, complete: Secondary | ICD-10-CM | POA: Diagnosis not present

## 2013-05-23 DIAGNOSIS — I340 Nonrheumatic mitral (valve) insufficiency: Secondary | ICD-10-CM | POA: Diagnosis present

## 2013-05-23 DIAGNOSIS — R5381 Other malaise: Secondary | ICD-10-CM | POA: Diagnosis not present

## 2013-05-23 DIAGNOSIS — I071 Rheumatic tricuspid insufficiency: Secondary | ICD-10-CM | POA: Diagnosis present

## 2013-05-23 DIAGNOSIS — Z7982 Long term (current) use of aspirin: Secondary | ICD-10-CM

## 2013-05-23 DIAGNOSIS — I059 Rheumatic mitral valve disease, unspecified: Secondary | ICD-10-CM | POA: Diagnosis present

## 2013-05-23 DIAGNOSIS — D62 Acute posthemorrhagic anemia: Secondary | ICD-10-CM | POA: Diagnosis not present

## 2013-05-23 DIAGNOSIS — Z952 Presence of prosthetic heart valve: Secondary | ICD-10-CM

## 2013-05-23 DIAGNOSIS — J9819 Other pulmonary collapse: Secondary | ICD-10-CM | POA: Diagnosis not present

## 2013-05-23 DIAGNOSIS — D72829 Elevated white blood cell count, unspecified: Secondary | ICD-10-CM | POA: Diagnosis present

## 2013-05-23 DIAGNOSIS — Z954 Presence of other heart-valve replacement: Secondary | ICD-10-CM

## 2013-05-23 DIAGNOSIS — Z79899 Other long term (current) drug therapy: Secondary | ICD-10-CM

## 2013-05-23 DIAGNOSIS — Z8679 Personal history of other diseases of the circulatory system: Secondary | ICD-10-CM | POA: Diagnosis not present

## 2013-05-23 DIAGNOSIS — I052 Rheumatic mitral stenosis with insufficiency: Secondary | ICD-10-CM | POA: Diagnosis not present

## 2013-05-23 DIAGNOSIS — I10 Essential (primary) hypertension: Secondary | ICD-10-CM | POA: Diagnosis present

## 2013-05-23 DIAGNOSIS — Z9889 Other specified postprocedural states: Secondary | ICD-10-CM

## 2013-05-23 DIAGNOSIS — I509 Heart failure, unspecified: Secondary | ICD-10-CM | POA: Diagnosis present

## 2013-05-23 DIAGNOSIS — K219 Gastro-esophageal reflux disease without esophagitis: Secondary | ICD-10-CM | POA: Diagnosis present

## 2013-05-23 HISTORY — DX: Atrioventricular block, complete: I44.2

## 2013-05-23 HISTORY — DX: Presence of other heart-valve replacement: Z95.4

## 2013-05-23 HISTORY — PX: FEMORAL HERNIA REPAIR: SHX632

## 2013-05-23 HISTORY — PX: INTRAOPERATIVE TRANSESOPHAGEAL ECHOCARDIOGRAM: SHX5062

## 2013-05-23 HISTORY — DX: Other specified postprocedural states: Z98.890

## 2013-05-23 HISTORY — PX: MITRAL VALVE REPLACEMENT: SHX147

## 2013-05-23 HISTORY — PX: MINIMALLY INVASIVE TRICUSPID VALVE REPAIR: SHX5975

## 2013-05-23 LAB — POCT I-STAT 3, ART BLOOD GAS (G3+)
Acid-base deficit: 5 mmol/L — ABNORMAL HIGH (ref 0.0–2.0)
Acid-base deficit: 7 mmol/L — ABNORMAL HIGH (ref 0.0–2.0)
Acid-base deficit: 6 mmol/L — ABNORMAL HIGH (ref 0.0–2.0)
Bicarbonate: 21.4 mEq/L (ref 20.0–24.0)
Bicarbonate: 26.6 mEq/L — ABNORMAL HIGH (ref 20.0–24.0)
Bicarbonate: 20.3 mEq/L (ref 20.0–24.0)
Bicarbonate: 19.3 mEq/L — ABNORMAL LOW (ref 20.0–24.0)
O2 Saturation: 100 %
O2 Saturation: 100 %
O2 Saturation: 92 %
Patient temperature: 35.5
TCO2: 29 mmol/L (ref 0–100)
TCO2: 22 mmol/L (ref 0–100)
pCO2 arterial: 92.8 mmHg (ref 35.0–45.0)
pCO2 arterial: 48.1 mmHg — ABNORMAL HIGH (ref 35.0–45.0)
pH, Arterial: 7.374 (ref 7.350–7.450)
pO2, Arterial: 76 mmHg — ABNORMAL LOW (ref 80.0–100.0)

## 2013-05-23 LAB — POCT I-STAT 4, (NA,K, GLUC, HGB,HCT)
Glucose, Bld: 111 mg/dL — ABNORMAL HIGH (ref 70–99)
Glucose, Bld: 85 mg/dL (ref 70–99)
Glucose, Bld: 145 mg/dL — ABNORMAL HIGH (ref 70–99)
Glucose, Bld: 129 mg/dL — ABNORMAL HIGH (ref 70–99)
HCT: 29 % — ABNORMAL LOW (ref 36.0–46.0)
HCT: 23 % — ABNORMAL LOW (ref 36.0–46.0)
HCT: 21 % — ABNORMAL LOW (ref 36.0–46.0)
HCT: 26 % — ABNORMAL LOW (ref 36.0–46.0)
Hemoglobin: 7.8 g/dL — ABNORMAL LOW (ref 12.0–15.0)
Hemoglobin: 7.1 g/dL — ABNORMAL LOW (ref 12.0–15.0)
Hemoglobin: 8.2 g/dL — ABNORMAL LOW (ref 12.0–15.0)
Hemoglobin: 8.8 g/dL — ABNORMAL LOW (ref 12.0–15.0)
Potassium: 3.7 mEq/L (ref 3.5–5.1)
Potassium: 5.4 mEq/L — ABNORMAL HIGH (ref 3.5–5.1)
Potassium: 4.1 mEq/L (ref 3.5–5.1)
Sodium: 139 mEq/L (ref 135–145)
Sodium: 139 mEq/L (ref 135–145)
Sodium: 140 mEq/L (ref 135–145)
Sodium: 140 mEq/L (ref 135–145)
Sodium: 143 mEq/L (ref 135–145)

## 2013-05-23 LAB — GLUCOSE, CAPILLARY
Glucose-Capillary: 94 mg/dL (ref 70–99)
Glucose-Capillary: 119 mg/dL — ABNORMAL HIGH (ref 70–99)
Glucose-Capillary: 109 mg/dL — ABNORMAL HIGH (ref 70–99)
Glucose-Capillary: 122 mg/dL — ABNORMAL HIGH (ref 70–99)

## 2013-05-23 LAB — PLATELET COUNT: Platelets: 151 10*3/uL (ref 150–400)

## 2013-05-23 LAB — CBC
Hemoglobin: 7.4 g/dL — ABNORMAL LOW (ref 12.0–15.0)
Hemoglobin: 10.7 g/dL — ABNORMAL LOW (ref 12.0–15.0)
MCH: 26.9 pg (ref 26.0–34.0)
MCHC: 32.6 g/dL (ref 30.0–36.0)
MCV: 82.5 fL (ref 78.0–100.0)
Platelets: 103 10*3/uL — ABNORMAL LOW (ref 150–400)
RBC: 2.75 MIL/uL — ABNORMAL LOW (ref 3.87–5.11)
RBC: 3.88 MIL/uL (ref 3.87–5.11)
WBC: 15.5 10*3/uL — ABNORMAL HIGH (ref 4.0–10.5)

## 2013-05-23 LAB — POCT I-STAT, CHEM 8
Chloride: 113 mEq/L — ABNORMAL HIGH (ref 96–112)
Glucose, Bld: 129 mg/dL — ABNORMAL HIGH (ref 70–99)
HCT: 32 % — ABNORMAL LOW (ref 36.0–46.0)
Hemoglobin: 10.9 g/dL — ABNORMAL LOW (ref 12.0–15.0)
Potassium: 3.9 mEq/L (ref 3.5–5.1)
Sodium: 143 mEq/L (ref 135–145)

## 2013-05-23 LAB — PROTIME-INR: Prothrombin Time: 18.4 seconds — ABNORMAL HIGH (ref 11.6–15.2)

## 2013-05-23 SURGERY — REPAIR, TRICUSPID VALVE, TRANSCATHETER
Anesthesia: General | Site: Groin | Laterality: Right | Wound class: Clean

## 2013-05-23 MED ORDER — SODIUM CHLORIDE 0.9 % IV SOLN
1.0000 g/h | INTRAVENOUS | Status: DC
  Filled 2013-05-23: qty 40

## 2013-05-23 MED ORDER — MIDAZOLAM HCL 5 MG/5ML IJ SOLN
INTRAMUSCULAR | Status: DC | PRN
  Administered 2013-05-23: 7 mg via INTRAVENOUS
  Administered 2013-05-23 (×2): 5 mg via INTRAVENOUS
  Administered 2013-05-23: 4 mg via INTRAVENOUS
  Administered 2013-05-23: 1 mg via INTRAVENOUS
  Administered 2013-05-23: 2 mg via INTRAVENOUS

## 2013-05-23 MED ORDER — CHLORHEXIDINE GLUCONATE 4 % EX LIQD: 30.0000 mL | CUTANEOUS | Status: DC

## 2013-05-23 MED ORDER — LACTATED RINGERS IV SOLN
INTRAVENOUS | Status: DC | PRN
  Administered 2013-05-23: 07:00:00 via INTRAVENOUS

## 2013-05-23 MED ORDER — SODIUM CHLORIDE 0.9 % IV SOLN
INTRAVENOUS | Status: DC
  Filled 2013-05-23: qty 40

## 2013-05-23 MED ORDER — PROTAMINE SULFATE 10 MG/ML IV SOLN
INTRAVENOUS | Status: DC | PRN
  Administered 2013-05-23: 150 mg via INTRAVENOUS

## 2013-05-23 MED ORDER — LACTATED RINGERS IV SOLN: INTRAVENOUS | Status: DC

## 2013-05-23 MED ORDER — VANCOMYCIN HCL IN DEXTROSE 1-5 GM/200ML-% IV SOLN
1000.0000 mg | Freq: Once | INTRAVENOUS | Status: AC
  Administered 2013-05-23: 1000 mg via INTRAVENOUS
  Filled 2013-05-23: qty 200

## 2013-05-23 MED ORDER — DEXTROSE 50 % IV SOLN
INTRAVENOUS | Status: AC
  Filled 2013-05-23: qty 50

## 2013-05-23 MED ORDER — PROPOFOL 10 MG/ML IV BOLUS
INTRAVENOUS | Status: DC | PRN
  Administered 2013-05-23: 70 mg via INTRAVENOUS

## 2013-05-23 MED ORDER — FAMOTIDINE IN NACL 20-0.9 MG/50ML-% IV SOLN
20.0000 mg | Freq: Two times a day (BID) | INTRAVENOUS | Status: DC
  Administered 2013-05-23: 20 mg via INTRAVENOUS

## 2013-05-23 MED ORDER — TRAMADOL HCL 50 MG PO TABS
50.0000 mg | ORAL_TABLET | ORAL | Status: DC | PRN
  Administered 2013-05-24 – 2013-05-26 (×9): 100 mg via ORAL
  Filled 2013-05-23 (×9): qty 2

## 2013-05-23 MED ORDER — METOPROLOL TARTRATE 25 MG/10 ML ORAL SUSPENSION
12.5000 mg | Freq: Two times a day (BID) | ORAL | Status: DC
  Filled 2013-05-23 (×3): qty 5

## 2013-05-23 MED ORDER — BISACODYL 10 MG RE SUPP: 10.0000 mg | Freq: Every day | RECTAL | Status: DC

## 2013-05-23 MED ORDER — DEXMEDETOMIDINE HCL IN NACL 200 MCG/50ML IV SOLN: 0.1000 ug/kg/h | INTRAVENOUS | Status: DC

## 2013-05-23 MED ORDER — DEXTROSE 5 % IV SOLN
1.5000 g | Freq: Two times a day (BID) | INTRAVENOUS | Status: AC
  Administered 2013-05-23 – 2013-05-25 (×4): 1.5 g via INTRAVENOUS
  Filled 2013-05-23 (×5): qty 1.5

## 2013-05-23 MED ORDER — DEXTROSE 50 % IV SOLN
18.0000 mL | Freq: Once | INTRAVENOUS | Status: AC | PRN
  Administered 2013-05-23: 18 mL via INTRAVENOUS

## 2013-05-23 MED ORDER — ROCURONIUM BROMIDE 100 MG/10ML IV SOLN
INTRAVENOUS | Status: DC | PRN
  Administered 2013-05-23 (×5): 50 mg via INTRAVENOUS

## 2013-05-23 MED ORDER — MORPHINE SULFATE 2 MG/ML IJ SOLN
2.0000 mg | INTRAMUSCULAR | Status: DC | PRN
  Administered 2013-05-23 – 2013-05-24 (×5): 2 mg via INTRAVENOUS
  Filled 2013-05-23 (×5): qty 1

## 2013-05-23 MED ORDER — LACTATED RINGERS IV SOLN
INTRAVENOUS | Status: DC | PRN
  Administered 2013-05-23 (×3): via INTRAVENOUS

## 2013-05-23 MED ORDER — AMIODARONE HCL IN DEXTROSE 360-4.14 MG/200ML-% IV SOLN
60.0000 mg/h | INTRAVENOUS | Status: DC
  Filled 2013-05-23: qty 200

## 2013-05-23 MED ORDER — GLUTARALDEHYDE 0.625% SOAKING SOLUTION
TOPICAL | Status: DC | PRN
  Filled 2013-05-23: qty 50

## 2013-05-23 MED ORDER — MILRINONE IN DEXTROSE 20 MG/100ML IV SOLN
0.3000 ug/kg/min | INTRAVENOUS | Status: DC
  Filled 2013-05-23: qty 100

## 2013-05-23 MED ORDER — SODIUM CHLORIDE 0.9 % IV SOLN: INTRAVENOUS | Status: DC

## 2013-05-23 MED ORDER — MORPHINE SULFATE 2 MG/ML IJ SOLN: 1.0000 mg | INTRAMUSCULAR | Status: AC | PRN

## 2013-05-23 MED ORDER — NITROGLYCERIN IN D5W 200-5 MCG/ML-% IV SOLN: 0.0000 ug/min | INTRAVENOUS | Status: DC

## 2013-05-23 MED ORDER — AMIODARONE HCL IN DEXTROSE 360-4.14 MG/200ML-% IV SOLN
30.0000 mg/h | INTRAVENOUS | Status: DC
  Administered 2013-05-24 – 2013-05-25 (×3): 30 mg/h via INTRAVENOUS
  Filled 2013-05-23 (×8): qty 200

## 2013-05-23 MED ORDER — ACETAMINOPHEN 500 MG PO TABS
1000.0000 mg | ORAL_TABLET | Freq: Four times a day (QID) | ORAL | Status: AC
  Administered 2013-05-24 – 2013-05-28 (×15): 1000 mg via ORAL
  Filled 2013-05-23 (×19): qty 2

## 2013-05-23 MED ORDER — METOCLOPRAMIDE HCL 5 MG/ML IJ SOLN
10.0000 mg | Freq: Four times a day (QID) | INTRAMUSCULAR | Status: AC
  Administered 2013-05-23 – 2013-05-25 (×8): 10 mg via INTRAVENOUS
  Filled 2013-05-23 (×8): qty 2

## 2013-05-23 MED ORDER — METOPROLOL TARTRATE 12.5 MG HALF TABLET
12.5000 mg | ORAL_TABLET | Freq: Two times a day (BID) | ORAL | Status: DC
  Filled 2013-05-23 (×3): qty 1

## 2013-05-23 MED ORDER — BISACODYL 5 MG PO TBEC
10.0000 mg | DELAYED_RELEASE_TABLET | Freq: Every day | ORAL | Status: DC
  Administered 2013-05-24 – 2013-06-03 (×4): 10 mg via ORAL
  Filled 2013-05-23: qty 2
  Filled 2013-05-23: qty 1
  Filled 2013-05-23 (×2): qty 2

## 2013-05-23 MED ORDER — ACETAMINOPHEN 160 MG/5ML PO SOLN: 1000.0000 mg | Freq: Four times a day (QID) | ORAL | Status: DC

## 2013-05-23 MED ORDER — CALCIUM CHLORIDE 10 % IV SOLN
1.0000 g | Freq: Once | INTRAVENOUS | Status: AC | PRN
  Filled 2013-05-23: qty 10

## 2013-05-23 MED ORDER — MILRINONE IN DEXTROSE 20 MG/100ML IV SOLN
0.2500 ug/kg/min | INTRAVENOUS | Status: AC
  Administered 2013-05-23: 0.25 ug/kg/min via INTRAVENOUS
  Filled 2013-05-23: qty 100

## 2013-05-23 MED ORDER — BUPIVACAINE 0.25 % ON-Q PUMP SINGLE CATH 300ML
300.0000 mL | INJECTION | Status: DC
  Filled 2013-05-23: qty 300

## 2013-05-23 MED ORDER — POTASSIUM CHLORIDE 10 MEQ/50ML IV SOLN: 10.0000 meq | INTRAVENOUS | Status: AC

## 2013-05-23 MED ORDER — INSULIN REGULAR BOLUS VIA INFUSION
0.0000 [IU] | Freq: Three times a day (TID) | INTRAVENOUS | Status: DC
  Filled 2013-05-23: qty 10

## 2013-05-23 MED ORDER — GLUTARALDEHYDE 0.625% SOAKING SOLUTION
TOPICAL | Status: DC
  Filled 2013-05-23: qty 50

## 2013-05-23 MED ORDER — MIDAZOLAM HCL 2 MG/2ML IJ SOLN: 2.0000 mg | INTRAMUSCULAR | Status: DC | PRN

## 2013-05-23 MED ORDER — ARTIFICIAL TEARS OP OINT
TOPICAL_OINTMENT | OPHTHALMIC | Status: DC | PRN
  Administered 2013-05-23: 1 via OPHTHALMIC

## 2013-05-23 MED ORDER — ASPIRIN EC 325 MG PO TBEC
325.0000 mg | DELAYED_RELEASE_TABLET | Freq: Every day | ORAL | Status: DC
  Administered 2013-05-24 – 2013-05-30 (×7): 325 mg via ORAL
  Filled 2013-05-23 (×8): qty 1

## 2013-05-23 MED ORDER — INSULIN ASPART 100 UNIT/ML ~~LOC~~ SOLN
0.0000 [IU] | SUBCUTANEOUS | Status: DC
  Administered 2013-05-23 – 2013-05-24 (×3): 2 [IU] via SUBCUTANEOUS

## 2013-05-23 MED ORDER — PHENYLEPHRINE HCL 10 MG/ML IJ SOLN
0.0000 ug/min | INTRAVENOUS | Status: DC
  Filled 2013-05-23: qty 2

## 2013-05-23 MED ORDER — ALBUMIN HUMAN 5 % IV SOLN
250.0000 mL | INTRAVENOUS | Status: AC | PRN
  Administered 2013-05-23: 250 mL via INTRAVENOUS

## 2013-05-23 MED ORDER — DOCUSATE SODIUM 100 MG PO CAPS
200.0000 mg | ORAL_CAPSULE | Freq: Every day | ORAL | Status: DC
  Administered 2013-05-24 – 2013-06-04 (×6): 200 mg via ORAL
  Filled 2013-05-23 (×11): qty 2

## 2013-05-23 MED ORDER — FAMOTIDINE IN NACL 20-0.9 MG/50ML-% IV SOLN: 20.0000 mg | Freq: Two times a day (BID) | INTRAVENOUS | Status: AC

## 2013-05-23 MED ORDER — SODIUM CHLORIDE 0.9 % IV SOLN
INTRAVENOUS | Status: DC
  Filled 2013-05-23: qty 1

## 2013-05-23 MED ORDER — ACETAMINOPHEN 650 MG RE SUPP: 650.0000 mg | Freq: Once | RECTAL | Status: AC

## 2013-05-23 MED ORDER — LIDOCAINE HCL (CARDIAC) 20 MG/ML IV SOLN
INTRAVENOUS | Status: DC | PRN
  Administered 2013-05-23: 100 mg via INTRAVENOUS

## 2013-05-23 MED ORDER — FENTANYL CITRATE 0.05 MG/ML IJ SOLN
INTRAMUSCULAR | Status: DC | PRN
  Administered 2013-05-23: 250 ug via INTRAVENOUS
  Administered 2013-05-23: 300 ug via INTRAVENOUS
  Administered 2013-05-23: 125 ug via INTRAVENOUS
  Administered 2013-05-23: 100 ug via INTRAVENOUS
  Administered 2013-05-23: 200 ug via INTRAVENOUS
  Administered 2013-05-23: 350 ug via INTRAVENOUS
  Administered 2013-05-23: 50 ug via INTRAVENOUS
  Administered 2013-05-23: 125 ug via INTRAVENOUS

## 2013-05-23 MED ORDER — ALBUMIN HUMAN 5 % IV SOLN
INTRAVENOUS | Status: DC | PRN
  Administered 2013-05-23 (×2): via INTRAVENOUS

## 2013-05-23 MED ORDER — ASPIRIN 81 MG PO CHEW: 324.0000 mg | CHEWABLE_TABLET | Freq: Every day | ORAL | Status: DC

## 2013-05-23 MED ORDER — SODIUM CHLORIDE 0.9 % IJ SOLN
3.0000 mL | INTRAMUSCULAR | Status: DC | PRN
  Administered 2013-06-01 – 2013-06-02 (×2): 3 mL via INTRAVENOUS

## 2013-05-23 MED ORDER — 0.9 % SODIUM CHLORIDE (POUR BTL) OPTIME
TOPICAL | Status: DC | PRN
  Administered 2013-05-23: 5000 mL

## 2013-05-23 MED ORDER — DEXTROSE 50 % IV SOLN
INTRAVENOUS | Status: AC
  Administered 2013-05-23: 12 mL
  Filled 2013-05-23: qty 50

## 2013-05-23 MED ORDER — MAGNESIUM SULFATE 40 MG/ML IJ SOLN
4.0000 g | Freq: Once | INTRAMUSCULAR | Status: AC
  Administered 2013-05-23: 4 g via INTRAVENOUS
  Filled 2013-05-23: qty 100

## 2013-05-23 MED ORDER — PANTOPRAZOLE SODIUM 40 MG PO TBEC
40.0000 mg | DELAYED_RELEASE_TABLET | Freq: Every day | ORAL | Status: DC
  Administered 2013-05-25 – 2013-06-04 (×11): 40 mg via ORAL
  Filled 2013-05-23 (×10): qty 1

## 2013-05-23 MED ORDER — METOPROLOL TARTRATE 1 MG/ML IV SOLN: 2.5000 mg | INTRAVENOUS | Status: DC | PRN

## 2013-05-23 MED ORDER — AMIODARONE HCL IN DEXTROSE 360-4.14 MG/200ML-% IV SOLN
30.0000 mg/h | INTRAVENOUS | Status: DC
  Administered 2013-05-23: 50 mg/h via INTRAVENOUS
  Filled 2013-05-23: qty 200

## 2013-05-23 MED ORDER — ACETAMINOPHEN 160 MG/5ML PO SOLN
650.0000 mg | Freq: Once | ORAL | Status: AC
  Administered 2013-05-23: 650 mg
  Filled 2013-05-23: qty 20.3

## 2013-05-23 MED ORDER — HEPARIN SODIUM (PORCINE) 1000 UNIT/ML IJ SOLN
INTRAMUSCULAR | Status: DC | PRN
  Administered 2013-05-23: 15000 [IU] via INTRAVENOUS

## 2013-05-23 MED ORDER — DEXMEDETOMIDINE HCL IN NACL 400 MCG/100ML IV SOLN
0.4000 ug/kg/h | INTRAVENOUS | Status: DC
  Filled 2013-05-23: qty 100

## 2013-05-23 MED ORDER — SODIUM CHLORIDE 0.45 % IV SOLN
INTRAVENOUS | Status: DC
  Administered 2013-05-23: 17:00:00 via INTRAVENOUS

## 2013-05-23 MED ORDER — SODIUM CHLORIDE 0.9 % IV SOLN: 250.0000 mL | INTRAVENOUS | Status: DC

## 2013-05-23 MED ORDER — METOPROLOL TARTRATE 12.5 MG HALF TABLET
12.5000 mg | ORAL_TABLET | Freq: Once | ORAL | Status: DC
  Filled 2013-05-23: qty 1

## 2013-05-23 MED ORDER — ONDANSETRON HCL 4 MG/2ML IJ SOLN
4.0000 mg | Freq: Four times a day (QID) | INTRAMUSCULAR | Status: DC | PRN
  Administered 2013-05-24: 4 mg via INTRAVENOUS
  Filled 2013-05-23 (×2): qty 2

## 2013-05-23 MED ORDER — SODIUM CHLORIDE 0.9 % IJ SOLN
3.0000 mL | Freq: Two times a day (BID) | INTRAMUSCULAR | Status: DC
  Administered 2013-05-24 – 2013-05-31 (×7): 3 mL via INTRAVENOUS

## 2013-05-23 SURGICAL SUPPLY — 128 items
ADAPTER CARDIO PERF ANTE/RETRO (ADAPTER) ×10 IMPLANT
ADH SKN CLS APL DERMABOND .7 (GAUZE/BANDAGES/DRESSINGS) ×8
ADPR PRFSN 84XANTGRD RTRGD (ADAPTER) ×8
APL SKNCLS STERI-STRIP NONHPOA (GAUZE/BANDAGES/DRESSINGS) ×8
ATTRACTOMAT 16X20 MAGNETIC DRP (DRAPES) ×5 IMPLANT
BAG DECANTER FOR FLEXI CONT (MISCELLANEOUS) ×10 IMPLANT
BENZOIN TINCTURE PRP APPL 2/3 (GAUZE/BANDAGES/DRESSINGS) ×10 IMPLANT
BLADE SURG 11 STRL SS (BLADE) ×11 IMPLANT
CANISTER SUCTION 2500CC (MISCELLANEOUS) ×18 IMPLANT
CANNULA FEM VENOUS REMOTE 22FR (CANNULA) ×1 IMPLANT
CANNULA FEMORAL ART 14 SM (MISCELLANEOUS) ×10 IMPLANT
CANNULA GUNDRY RCSP 15FR (MISCELLANEOUS) ×10 IMPLANT
CANNULA OPTISITE PERFUSION 16F (CANNULA) IMPLANT
CARDIAC SUCTION (MISCELLANEOUS) ×10 IMPLANT
CATH KIT ON Q 5IN SLV (PAIN MANAGEMENT) IMPLANT
CATH ROBINSON RED A/P 16FR (CATHETERS) ×2 IMPLANT
CATH ROBINSON RED A/P 18FR (CATHETERS) ×5 IMPLANT
CLOTH BEACON ORANGE TIMEOUT ST (SAFETY) ×10 IMPLANT
CONN ST 1/4X3/8  BEN (MISCELLANEOUS) ×4
CONN ST 1/4X3/8 BEN (MISCELLANEOUS) ×16 IMPLANT
CONT SPEC 4OZ CLIKSEAL STRL BL (MISCELLANEOUS) ×1 IMPLANT
CONT SPEC STER OR (MISCELLANEOUS) ×10 IMPLANT
COVER MAYO STAND STRL (DRAPES) ×10 IMPLANT
COVER PROBE W GEL 5X96 (DRAPES) ×1 IMPLANT
COVER SURGICAL LIGHT HANDLE (MISCELLANEOUS) ×20 IMPLANT
CRADLE DONUT ADULT HEAD (MISCELLANEOUS) ×10 IMPLANT
DERMABOND ADVANCED (GAUZE/BANDAGES/DRESSINGS) ×2
DERMABOND ADVANCED .7 DNX12 (GAUZE/BANDAGES/DRESSINGS) ×16 IMPLANT
DEVICE PMI PUNCTURE CLOSURE (MISCELLANEOUS) ×5 IMPLANT
DEVICE TROCAR PUNCTURE CLOSURE (ENDOMECHANICALS) ×10 IMPLANT
DRAIN CHANNEL 28F RND 3/8 FF (WOUND CARE) ×20 IMPLANT
DRAPE BILATERAL SPLIT (DRAPES) ×10 IMPLANT
DRAPE C-ARM 42X72 X-RAY (DRAPES) ×10 IMPLANT
DRAPE CV SPLIT W-CLR ANES SCRN (DRAPES) ×10 IMPLANT
DRAPE INCISE IOBAN 66X45 STRL (DRAPES) ×25 IMPLANT
DRAPE SLUSH/WARMER DISC (DRAPES) IMPLANT
DRSG COVADERM 4X6 (GAUZE/BANDAGES/DRESSINGS) ×1 IMPLANT
DRSG COVADERM 4X8 (GAUZE/BANDAGES/DRESSINGS) ×10 IMPLANT
ELECT BLADE 6.5 EXT (BLADE) ×10 IMPLANT
ELECT REM PT RETURN 9FT ADLT (ELECTROSURGICAL) ×20
ELECTRODE REM PT RTRN 9FT ADLT (ELECTROSURGICAL) ×16 IMPLANT
FEMORAL VENOUS CANN RAP (CANNULA) IMPLANT
GLOVE BIO SURGEON STRL SZ 6 (GLOVE) ×4 IMPLANT
GLOVE BIO SURGEON STRL SZ 6.5 (GLOVE) ×2 IMPLANT
GLOVE BIO SURGEON STRL SZ7 (GLOVE) ×2 IMPLANT
GLOVE ORTHO TXT STRL SZ7.5 (GLOVE) ×30 IMPLANT
GOWN STRL NON-REIN LRG LVL3 (GOWN DISPOSABLE) ×42 IMPLANT
GRAFT PTCH CORMATRIX 7X10 4PLY (Prosthesis & Implant Heart) ×1 IMPLANT
GUIDEWIRE ANG ZIPWIRE 038X150 (WIRE) ×5 IMPLANT
INSERT CONFORM CROSS CLAMP 66M (MISCELLANEOUS) ×5 IMPLANT
INSERT CONFORM CROSS CLAMP 86M (MISCELLANEOUS) ×5 IMPLANT
INTRODUCER SET COOK 16FR (MISCELLANEOUS) ×1 IMPLANT
KIT BASIN OR (CUSTOM PROCEDURE TRAY) ×10 IMPLANT
KIT DILATOR VASC 18G NDL (KITS) ×9 IMPLANT
KIT DRAINAGE VACCUM ASSIST (KITS) ×1 IMPLANT
KIT ROOM TURNOVER OR (KITS) ×10 IMPLANT
KIT SUCTION CATH 14FR (SUCTIONS) ×12 IMPLANT
LEAD PACING MYOCARDI (MISCELLANEOUS) ×10 IMPLANT
LINE VENT (MISCELLANEOUS) ×1 IMPLANT
NDL AORTIC ROOT 14G 7F (CATHETERS) ×8 IMPLANT
NEEDLE AORTIC ROOT 14G 7F (CATHETERS) ×10 IMPLANT
NS IRRIG 1000ML POUR BTL (IV SOLUTION) ×40 IMPLANT
PACK OPEN HEART (CUSTOM PROCEDURE TRAY) ×10 IMPLANT
PAD ARMBOARD 7.5X6 YLW CONV (MISCELLANEOUS) ×20 IMPLANT
PAD ELECT DEFIB RADIOL ZOLL (MISCELLANEOUS) ×10 IMPLANT
RETRACTOR PVM SOFT TISSUE M (INSTRUMENTS) ×1 IMPLANT
RETRACTOR TRL SOFT TISSUE LG (INSTRUMENTS) IMPLANT
RETRACTOR TRM SOFT TISSUE 7.5 (INSTRUMENTS) IMPLANT
SET CANNULATION TOURNIQUET (MISCELLANEOUS) ×10 IMPLANT
SET CARDIOPLEGIA MPS 5001102 (MISCELLANEOUS) ×1 IMPLANT
SET IRRIG TUBING LAPAROSCOPIC (IRRIGATION / IRRIGATOR) ×9 IMPLANT
SOLUTION ANTI FOG 6CC (MISCELLANEOUS) ×10 IMPLANT
SPONGE GAUZE 4X4 12PLY (GAUZE/BANDAGES/DRESSINGS) ×10 IMPLANT
SUCKER WEIGHTED FLEX (MISCELLANEOUS) ×20 IMPLANT
SUT BONE WAX W31G (SUTURE) ×10 IMPLANT
SUT E-PACK MINIMALLY INVASIVE (SUTURE) ×5 IMPLANT
SUT ETHIBOND (SUTURE) ×2 IMPLANT
SUT ETHIBOND 2 0 SH (SUTURE) ×3 IMPLANT
SUT ETHIBOND 2 0 V4 (SUTURE) IMPLANT
SUT ETHIBOND 2 0V4 GREEN (SUTURE) IMPLANT
SUT ETHIBOND 2-0 RB-1 WHT (SUTURE) ×2 IMPLANT
SUT ETHIBOND 4 0 TF (SUTURE) IMPLANT
SUT ETHIBOND 5 0 C 1 30 (SUTURE) IMPLANT
SUT ETHIBOND NAB MH 2-0 36IN (SUTURE) ×5 IMPLANT
SUT ETHIBOND X763 2 0 SH 1 (SUTURE) ×10 IMPLANT
SUT GORETEX 6.0 TH-9 30 IN (SUTURE) IMPLANT
SUT GORETEX CV 4 TH 22 36 (SUTURE) ×10 IMPLANT
SUT GORETEX CV-5THC-13 36IN (SUTURE) IMPLANT
SUT GORETEX CV4 TH-18 (SUTURE) ×20 IMPLANT
SUT GORETEX TH-18 36 INCH (SUTURE) IMPLANT
SUT MNCRL AB 3-0 PS2 18 (SUTURE) ×20 IMPLANT
SUT PROLENE 3 0 SH DA (SUTURE) ×23 IMPLANT
SUT PROLENE 3 0 SH1 36 (SUTURE) ×50 IMPLANT
SUT PROLENE 4 0 RB 1 (SUTURE) ×40
SUT PROLENE 4-0 RB1 .5 CRCL 36 (SUTURE) ×8 IMPLANT
SUT PROLENE 5 0 C 1 36 (SUTURE) ×10 IMPLANT
SUT PROLENE 6 0 C 1 30 (SUTURE) ×10 IMPLANT
SUT SILK  1 MH (SUTURE) ×9
SUT SILK 1 MH (SUTURE) ×36 IMPLANT
SUT SILK 1 TIES 10X30 (SUTURE) ×5 IMPLANT
SUT SILK 2 0 SH CR/8 (SUTURE) ×10 IMPLANT
SUT SILK 2 0 TIES 10X30 (SUTURE) ×5 IMPLANT
SUT SILK 2 0SH CR/8 30 (SUTURE) ×10 IMPLANT
SUT SILK 3 0 (SUTURE) ×5
SUT SILK 3 0 SH CR/8 (SUTURE) ×10 IMPLANT
SUT SILK 3 0SH CR/8 30 (SUTURE) ×5 IMPLANT
SUT SILK 3-0 18XBRD TIE 12 (SUTURE) ×4 IMPLANT
SUT TEM PAC WIRE 2 0 SH (SUTURE) ×10 IMPLANT
SUT VIC AB 2-0 CT1 18 (SUTURE) ×1 IMPLANT
SUT VIC AB 2-0 CTX 36 (SUTURE) ×20 IMPLANT
SUT VIC AB 2-0 UR6 27 (SUTURE) ×10 IMPLANT
SUT VIC AB 3-0 SH 8-18 (SUTURE) ×30 IMPLANT
SUT VICRYL 2 TP 1 (SUTURE) ×10 IMPLANT
SYRINGE 10CC LL (SYRINGE) ×9 IMPLANT
SYSTEM ANNULOPLASTY MC3 (Orthopedic Implant) ×1 IMPLANT
SYSTEM SAHARA CHEST DRAIN ATS (WOUND CARE) ×9 IMPLANT
TAPE CLOTH SURG 4X10 WHT LF (GAUZE/BANDAGES/DRESSINGS) ×1 IMPLANT
TOWEL OR 17X24 6PK STRL BLUE (TOWEL DISPOSABLE) ×10 IMPLANT
TOWEL OR 17X26 10 PK STRL BLUE (TOWEL DISPOSABLE) ×10 IMPLANT
TRAY FOLEY IC TEMP SENS 14FR (CATHETERS) ×9 IMPLANT
TROCAR XCEL BLADELESS 5X75MML (TROCAR) ×9 IMPLANT
TROCAR XCEL NON-BLD 11X100MML (ENDOMECHANICALS) ×18 IMPLANT
TUBE SUCT INTRACARD DLP 20F (MISCELLANEOUS) ×10 IMPLANT
TUNNELER SHEATH ON-Q 11GX8 DSP (PAIN MANAGEMENT) ×1 IMPLANT
UNDERPAD 30X30 INCONTINENT (UNDERPADS AND DIAPERS) ×9 IMPLANT
VALVE MITRAL 33MM (Prosthesis & Implant Heart) ×1 IMPLANT
WATER STERILE IRR 1000ML POUR (IV SOLUTION) ×18 IMPLANT
WIRE BENTSON .035X145CM (WIRE) ×10 IMPLANT

## 2013-05-23 NOTE — Progress Notes (Signed)
TC TS  p.m. Rounds Status post right minithoracotomy and mitral valve repair Postop by mouth 255, O2 sats now improved Postop hemoglobin 7.5, receiving 2 units of packed cells Hemodynamic stable cardiac index greater than 2 Minimal chest tube output Lungs fairly clear bilaterally

## 2013-05-23 NOTE — Interval H&P Note (Signed)
History and Physical Interval Note:  05/23/2013 7:38 AM  Mary [name]  has presented today for surgery, with the diagnosis of mitral regurgitation, tricuspid regurgitation  The various methods of treatment have been discussed with the patient and family. After consideration of risks, benefits and other options for treatment, the patient has consented to  Procedure(s) with comments: MINIMALLY INVASIVE MITRAL VALVE REPAIR (MVR) (Right) - or mitral valve replacement MINIMALLY INVASIVE TRICUSPID VALVE REPAIR (Right) INTRAOPERATIVE TRANSESOPHAGEAL ECHOCARDIOGRAM (N/A) as a surgical intervention .  The patient's history has been reviewed, patient examined, no change in status, stable for surgery.  I have reviewed the patient's chart and labs.  Questions were answered to the patient's satisfaction.     OWEN,CLARENCE H

## 2013-05-23 NOTE — Transfer of Care (Signed)
Immediate Anesthesia Transfer of Care Note  Patient: Mary [name]  Procedure(s) Performed: Procedure(s): MINIMALLY INVASIVE TRICUSPID VALVE REPAIR (Right) INTRAOPERATIVE TRANSESOPHAGEAL ECHOCARDIOGRAM (N/A) HERNIA REPAIR FEMORAL (Right) MITRAL VALVE (MV) REPLACEMENT (N/A)  Patient Location: SICU  Anesthesia Type:General  Level of Consciousness: sedated and Patient remains intubated per anesthesia plan  Airway & Oxygen Therapy: Patient remains intubated per anesthesia plan and Patient placed on Ventilator (see vital sign flow sheet for setting)  Post-op Assessment: Report given to PACU RN and Post -op Vital signs reviewed and stable  Post vital signs: Reviewed and stable  Complications: No apparent anesthesia complications

## 2013-05-23 NOTE — Progress Notes (Signed)
Recruitment maneuver performed x38minutes at this time per Dr. Cornelius Moras. Pt tolerated well. Pt taken back down to 50%. No complications noted. RT will continue to monitor.

## 2013-05-23 NOTE — Preoperative (Signed)
Beta Blockers   Reason not to administer Beta Blockers:  Preop BB held for low BP

## 2013-05-23 NOTE — Progress Notes (Signed)
  Echocardiogram Echocardiogram Transesophageal has been performed.  Margreta Journey 05/23/2013, 1:55 PM

## 2013-05-23 NOTE — Op Note (Signed)
CARDIOTHORACIC SURGERY OPERATIVE NOTE  Date of Procedure:  05/23/2013  Preoperative Diagnosis:   Severe Mitral Regurgitation  Mild Mitral Stenosis  Postoperative Diagnosis:  Severe Mitral Regurgitation  Mild Mitral Stenosis  Moderate-severe Tricuspid Regurgitation  Incarcerated Right Femoral Hernia  Procedure:    Minimally-Invasive Mitral Valve Replacement   Sorin Carbomedics Optiform mechanical prosthesis (size 33mm, catalog #[id number], serial #[id number])   Minimally-Invasive Tricuspid Valve Repair   Complex valvuloplasty including Cormatrix ECM patch augmentation of anterior and lateral leaflets  Edwards mc3 ring annuloplasty (size 26mm, model #4900, serial #[id number])   Primary Repair of Right Femoral Hernia    Surgeon: Salvatore Decent. Cornelius Moras, MD  Assistant: Lowella Dandy, PA-C  Anesthesia: Kipp Brood, MD  Operative Findings: Rheumatic mitral and tricuspid valve disease  Type IIIa mitral valve dysfunction with severe mitral regurgitation and mild mitral stenosis  Type IIIa tricuspid valve dysfunction with moderate-severe tricuspid regurgitation  Severe pulmonary hypertension  Normal LV systolic function  Normal RV systolic function Mild residual tricuspid regurgitation after successful valve repair Small incarcerated right femoral hernia                          BRIEF CLINICAL NOTE AND INDICATIONS FOR SURGERY  Patient is a 52 year old African American female who returns for followup of severe symptomatic mitral regurgitation. She was originally seen in consultation by Dr. Laneta Simmers and myself during her recent hospitalization for acute exacerbation of congestive heart failure. She was admitted to the hospital 03/15/2013 with resting shortness of breath, orthopnea, lower extremity edema, fever and chills. She was initially treated with empiric antibiotics for presumed community acquired pneumonia. She was noted to have a prominent systolic murmur on  physical exam and an echocardiogram demonstrated severe mitral regurgitation. There was suggestion of the presence of a vegetation on the anterior leaflet of the mitral valve by transthoracic echocardiogram, and subsequently patient was treated for the presumptive diagnosis of bacterial endocarditis. Blood cultures have always remained negative, although apparently none were obtained prior to initiation of antibiotic therapy. Transesophageal echocardiogram performed by Dr. 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 There was no clear vegetation identified. Her symptoms of congestive heart failure improved rapidly with diuretic therapy. Left and right heart catheterization was notable for the absence of significant coronary artery disease and confirmed the presence of severe mitral regurgitation. There was moderate pulmonary hypertension. Plans were made at that time to allow the patient to complete her course of empiric antibiotics and check followup with cultures once antibiotic therapy was discontinued. Since hospital discharge the patient has also been seen by Dr. [name] in the dental clinic and undergone dental extraction. She was last seen here in the office 04/22/2013. She completed her course of IV antibiotics and her PICC line was removed. Subsequent followup blood cultures remained no growth. The patient has been seen in consultation and counseled at length regarding the indications, risks and potential benefits of surgery.  All questions have been answered, and the patient provides full informed consent for the operation as described.     DETAILS OF THE OPERATIVE PROCEDURE  Preparation:  The patient is brought to the operating room on the above mentioned date and central monitoring was established by the anesthesia team including placement of Swan-Ganz catheter through the left internal jugular vein.  There was  severe pulmonary hypertension with pulmonary artery pressures 3/4 systemic pressure.  A radial arterial line is placed.  The patient is placed in the supine position on the operating table.  Intravenous antibiotics are administered. General endotracheal anesthesia is induced uneventfully. The patient is initially intubated using a dual lumen endotracheal tube.  A Foley catheter is placed.  Upon examination of the patient under general anesthesia there was a small soft mass in the right groin that appeared to be consistent with a femoral hernia. This mass could not be reduced into the peritoneal cavity. Careful review of the patient's CT angiogram performed 2 days previously confirmed the presence of what appeared to be an incarcerated piece of preperitoneal fat extending beneath the inguinal ligament into the femoral triangle.  Baseline transesophageal echocardiogram was performed.  Findings were notable for classical rheumatic mitral valve disease with severe mitral regurgitation and mild mitral stenosis. Both the anterior and posterior leaflets of the mitral valve were severely thickened and sclerotic with severely restricted leaflet motion during both systole and diastole. There was a broad central jet of mitral regurgitation that filled the left atrium. There was mild to moderate mitral stenosis with mean transvalvular gradient estimated 12 mm mercury and valve area estimated 1.2 cm by planimetry. There was normal left ventricular size and systolic function. There was severe left atrial enlargement. There was normal right ventricular size and function. There was moderate to severe tricuspid regurgitation. The jet of tricuspid regurgitation was very eccentric and somewhat easy to overlook. However, there was thickening of the anterior and lateral leaflets of the tricuspid valve with a severe eccentric jet of regurgitation coursing along the intra-atrial septum. The severity of regurgitation was confirmed by  noting the presence of flow reversal in the hepatic veins.   A soft roll is placed behind the patient's left scapula and the neck gently extended and turned to the left.   The patient's right neck, chest, abdomen, both groins, and both lower extremities are prepared and draped in a sterile manner. A time out procedure is performed.   Repair of Right Femoral Hernia:  A small incision is made in the right inguinal crease and the anterior surface of the right common femoral artery and right common femoral vein are identified.  Just medial to the vessels there is an incarcerated femoral hernia. This structure is carefully dissected away from associated tissues until an incarcerated piece of preperitoneal fat is clearly identified. The inguinal ligament is incised to facilitate reduction of the hernia into the peritoneal cavity. The femoral hernia is subsequently repaired using interrupted 2-0 Vicryl sutures to close the inguinal ligament over the defect.   Surgical Approach:  A right miniature anterolateral thoracotomy incision is performed. The incision is placed just lateral to and superior to the right nipple. The pectoralis major muscle is retracted medially and completely preserved. The right pleural space is entered through the 4th intercostal space. A soft tissue retractor is placed.  Two 11 mm ports are placed through separate stab incisions inferiorly. The right pleural space is insufflated continuously with carbon dioxide gas through the posterior port during the remainder of the operation.  A pledgeted sutures placed through the dome of the right hemidiaphragm and retracted inferiorly to facilitate exposure.  A longitudinal incision is made in the pericardium 3 cm anterior to the phrenic nerve and silk traction sutures are placed on either side of the incision for exposure.   Extracorporeal Cardiopulmonary Bypass and Myocardial Protection:  The patient is placed in Trendelenburg position. The  right internal jugular vein is cannulated with Seldinger technique and a guidewire advanced  into the right atrium. The patient is heparinized systemically. The right internal jugular vein is cannulated with a 14 [place] pediatric femoral venous cannula. Pursestring sutures are placed on the anterior surface of the right common femoral vein and right common femoral artery. The right common femoral vein is cannulated with the Seldinger technique and a guidewire is advanced under transesophageal echocardiogram guidance through the right atrium. The femoral vein is cannulated with a long 22 French femoral venous cannula. The right common femoral artery is cannulated with Seldinger technique and a flexible guidewire is advanced until it can be appreciated intraluminally in the descending thoracic aorta on transesophageal echocardiogram. The femoral artery is cannulated with an 18 French femoral arterial cannula.  Adequate heparinization is verified.      The entire pre-bypass portion of the operation was notable for stable hemodynamics.  Cardiopulmonary bypass was begun.  Vacuum assist venous drainage is utilized. The incision in the pericardium is extended in both directions. Venous drainage and exposure are notably excellent. A retrograde cardioplegia cannula is placed through the right atrium into the coronary sinus using transesophageal echocardiogram guidance.  An antegrade cardioplegia cannula is placed in the ascending aorta.  Umbilical tapes are placed around the superior and inferior vena cavae.  The patient is cooled to 28C systemic temperature.  The aortic cross clamp is applied and cold blood cardioplegia is delivered initially in an antegrade fashion through the aortic root.  Supplemental cardioplegia is given retrograde through the coronary sinus catheter. The initial cardioplegic arrest is rapid with early diastolic arrest.  Repeat doses of cardioplegia are administered intermittently every 20 to 30  minutes throughout the entire cross clamp portion of the operation through the aortic root and through the coronary sinus catheter in order to maintain completely flat electrocardiogram.  Myocardial protection was felt to be excellent.   Mitral Valve Replacement:  A left atriotomy incision was performed through the interatrial groove and extended partially across the back wall of the left atrium after opening the oblique sinus inferiorly.  The mitral valve and the floor of the left atrium are exposed using a retractor blade placed into the left atrium and attached to a straight bar placed through the 3rd intercostal space just to the right side of the sternum.  Exposure was notably excellent.  The mitral valve was inspected and notable for classical rheumatic findings with severe scarring and fibrosis of both the anterior and posterior leaflets. There is mild mitral stenosis. All of the subvalvular apparatus is severely diseased with severe thickening and foreshortening of all the primary cords to both the anterior and posterior leaflets. The posterior leaflet is completely frozen and essentially a single sheet of fibrotic tissue.  The anterior leaflet of the mitral valve was excised sharply including all of the subvalvular apparatus.  All of the primary cords were too thickened and foreshortened to facilitate cordal preservation to the anterior leaflet.  The posterior leaflet split in the midline.  The mitral annulus was sized to accept a 33 mm prosthesis.  The left ventricle was irrigated with copious cold saline solution.  Mitral valve replacement was performed using interrupted horizontal mattress 2-0 Ethibond pledgeted sutures with pledgets in the supraannular position.  All of the subvalvular apparatus to the posterior leaflet was preserved.  A Sorin Carbomedics Optiform mechanical mitral valve prosthesis (size 33 mm, catalog [id number], serial # [id number]) was implanted uneventfully. The valve seated  appropriately with normal leaflet mobility and no sign of leaflet impingement on the remaining subvalvular  apparatus.  The valve is tested with saline and appears to be perfectly competent with no sign of perivalvular leak.  Rewarming is begun.  The atriotomy was closed using a 2-layer closure of running 3-0 Prolene suture after placing a sump drain across the mitral valve to serve as a left ventricular vent.     Tricuspid Valve Repair:  The inferior vena cava cannula was pulled down until the tip was just below the junction between the right atrium and the inferior vena cava and both caval umbilical tapes were secured. An oblique incision is made in the right atrium. Traction sutures are placed to facilitate exposure of the tricuspid valve. The tricuspid valve is inspected carefully.  There is clear rheumatic disease afflicting the tricuspid valve with sclerosis involving her the anterior and lateral leaflets. The overall size of the anterior and lateral leaflets also appeared contracted and sclerotic although both leaflets moved reasonably well.  The septal leaflet appeared normal.  Interrupted 2-0 Ethibond horizontal mattress sutures placed circumferentially around the tricuspid annulus with exception of the area immediately below the triangle of Koch.  Ultimately the sutures will be utilized for ring annuloplasty, and at this juncture there are utilized to suspend the valve symmetrically. With the valve suspended a saline test was performed and it is clear that simple ring annuloplasty will not provide adequate coaptation because of the thickening and contracted size of the anterior lateral leaflets. Patch augmentation is felt to be indicated.  The anterior and lateral leaflets of the tricuspid valve are subsequently mobilized off of the tricuspid annulus from commissure to commissure using an 11 blade knife.  A portion of Cor-matrix bovine extracellular matrix is soaked in saline per the manufacturer's  recommendation and subsequently trimmed to an elliptical shape.  The patches subsequently inserted using a 2 layer closure of running 4-0 Prolene suture to attach the patch initially to the tricuspid annulus and subsequently to the free margin of the anterior and lateral leaflets.  After completion of patch augmentation the anterolateral leaflets have improved mobility and there appears to be adequate leaflet tissue for appropriate coaptation.  The valve was sized to accept a 26mm annuloplasty ring based upon the overall surface area of the combined anterior and posterior leaflets. An [facility] 3 annuloplasty ring (size 26mm, model #4900, serial [id number]) is implanted uneventfully. After completion of the annuloplasty the valve was tested with saline and appears to be competent.    Procedure Completion:  One final dose of warm retrograde "hot shot" cardioplegia was administered retrograde through the coronary sinus catheter while all air was evacuated through the aortic root.  The aortic cross clamp was removed after a total cross clamp time of 153 minutes.  The right atriotomy incision is closed using a 2 layer closure of running 4-0 Prolene suture.  The retrograde cardioplegia cannula was removed.  Epicardial pacing wires are fixed to the inferior wall of the right ventricule and to the right atrial appendage. The patient is rewarmed to 37C temperature. The left ventricular vent is removed.  The patient is ventilated and flow volumes turndown while the mitral valve function is inspected using transesophageal echocardiogram. The valve appears to be functioning normally with no perivalvular leak. The antegrade cardioplegia cannula is now removed. The patient is weaned and disconnected from cardiopulmonary bypass.  The patient's rhythm at separation from bypass was atrial fibrillation.  Amiodarone infusion was begun.  The patient was weaned from bypass on low dose milrinone infusion. Total cardiopulmonary  bypass  time for the operation was 212 minutes.  Followup transesophageal echocardiogram performed after separation from bypass revealed a well-seated mechanical valve in the mitral position that was functioning normally. There was no paravalvular leak.  Left ventricular function was unchanged from preoperatively.  There was a well-seated annuloplasty ring and the tricuspid position with a Swan-Ganz catheter traversing the tricuspid valve. The valve appeared to be functioning normally with trace to mild residual tricuspid regurgitation.  There was normal right ventricular size and function.  The femoral arterial and venous cannulae were removed uneventfully. There was a palpable pulse in the distal right common femoral artery after removal of the cannula. Protamine was administered to reverse the anticoagulation. The right internal jugular cannula was removed and manual pressure held on the neck for 15 minutes.  Single lung ventilation was begun. The atriotomy closure was inspected for hemostasis. The pericardial sac was drained using a 28 French Bard drain placed through the anterior port incision.  The pericardium was closed using a patch of core matrix bovine submucosal tissue patch. The right pleural space is irrigated with saline solution and inspected for hemostasis. The right pleural space was drained using a 28 French Bard drain placed through the posterior port incision. The miniature thoracotomy incision was closed in multiple layers in routine fashion. The right groin incision was inspected for hemostasis and closed in multiple layers in routine fashion.   Disposition:  The patient tolerated the procedure well.  The patient was reintubated using a single lumen endotracheal tube and subsequently transported to the surgical intensive care unit in stable condition. There were no intraoperative complications. All sponge instrument and needle counts are verified correct at completion of the  operation.  The post-bypass portion of the operation was notable for stable rhythm and hemodynamics.  The patient spontaneously converted to sinus rhythm.  By the completion of the procedure pulmonary artery pressures were notably less than half of what they were preoperatively.  No blood products were administered during the operation.   Salvatore Decent. Cornelius Moras MD 05/23/2013 3:11 PM

## 2013-05-23 NOTE — Anesthesia Preprocedure Evaluation (Addendum)
Anesthesia Evaluation  Patient identified by MRN, date of birth, ID band Patient awake    Reviewed: Allergy & Precautions, H&P , NPO status , Patient's Chart, lab work & pertinent test results, reviewed documented beta blocker date and time   History of Anesthesia Complications Negative for: history of anesthetic complications  Airway Mallampati: II TM Distance: >3 FB Neck ROM: Full    Dental  (+) Partial Lower, Dental Advisory Given and Missing   Pulmonary shortness of breath and with exertion, asthma , pneumonia -, resolved, Current Smoker,  breath sounds clear to auscultation        Cardiovascular Exercise Tolerance: Poor hypertension, Pt. on medications and Pt. on home beta blockers + Orthopnea + dysrhythmias Supra Ventricular Tachycardia Rhythm:Regular Rate:Normal + Systolic murmurs H/o acute diastolic heart failure, infectious endocarditis Rheumatic mitral stenosis--MR, TR  03/18/13 echo:  EF 60-65% MVA 1.68 cm2 Mean mitral valve gradient 14 mmhg   Neuro/Psych negative neurological ROS     GI/Hepatic Neg liver ROS, GERD-  Medicated and Controlled,  Endo/Other  negative endocrine ROS  Renal/GU negative Renal ROS     Musculoskeletal negative musculoskeletal ROS (+)   Abdominal   Peds  Hematology negative hematology ROS (+)   Anesthesia Other Findings   Reproductive/Obstetrics negative OB ROS                          Anesthesia Physical Anesthesia Plan  ASA: IV  Anesthesia Plan: General   Post-op Pain Management:    Induction: Intravenous  Airway Management Planned: Double Lumen EBT and Oral ETT  Additional Equipment: Arterial line, Ultrasound Guidance Line Placement, CVP, PA Cath and 3D TEE  Intra-op Plan:   Post-operative Plan: Post-operative intubation/ventilation  Informed Consent: I have reviewed the patients History and Physical, chart, labs and discussed the procedure  including the risks, benefits and alternatives for the proposed anesthesia with the patient or authorized representative who has indicated his/her understanding and acceptance.   Dental advisory given  Plan Discussed with: CRNA and Anesthesiologist  Anesthesia Plan Comments:         Anesthesia Quick Evaluation

## 2013-05-23 NOTE — Brief Op Note (Addendum)
05/23/2013  1:50 PM  PATIENT:  Mary [name]  52 y.o. female  PRE-OPERATIVE DIAGNOSIS:  mitral regurgitation  POST-OPERATIVE DIAGNOSIS:  mitral regurgitation, tricuspid regurgitation, right femoral hernia  PROCEDURE:  Procedure(s) with comments:  MINIMALLY INVASIVE MITRAL VALVE REPLACEMENT (Right) -33 mm Sorin Carbomedics Optiform Mechanical Valve  MINIMALLY INVASIVE TRICUSPID VALVE REPAIR (Right) - complex valvuloplasty including CorMatrix Patch augmentation of anterior and lateral leaflets - 26 mm [employer] [facility] 3 Ring annuloplasty  HERNIA REPAIR FEMORAL (Right)   SURGEON:    Purcell Nails, MD  ASSISTANTS:  Lowella Dandy, PA-C  ANESTHESIA:   Kipp Brood, MD  CROSSCLAMP TIME:   153'  CARDIOPULMONARY BYPASS TIME: 212'  FINDINGS:  Rheumatic mitral and tricuspid valve disease  Type IIIa mitral valve dysfunction with severe mitral regurgitation and mild mitral stenosis  Type IIIa tricuspid valve dysfunction with moderate-severe tricuspid regurgitation  Severe pulmonary hypertension  Normal LV systolic function  Normal RV systolic function  Mild residual tricuspid regurgitation after successful valve repair  Small incarcerated right femoral hernia  COMPLICATIONS: none  PATIENT DISPOSITION:   TO SICU IN STABLE CONDITION  [name] 05/23/2013 3:11 PM

## 2013-05-23 NOTE — Progress Notes (Signed)
Utilization Review Completed.Mary [name] T8/28/2014

## 2013-05-23 NOTE — Anesthesia Postprocedure Evaluation (Signed)
  Anesthesia Post-op Note  Patient: Mary [name]  Procedure(s) Performed: Procedure(s): MINIMALLY INVASIVE TRICUSPID VALVE REPAIR (Right) INTRAOPERATIVE TRANSESOPHAGEAL ECHOCARDIOGRAM (N/A) HERNIA REPAIR FEMORAL (Right) MITRAL VALVE (MV) REPLACEMENT (N/A)  Patient Location: SICU  Anesthesia Type:General  Level of Consciousness: Sedated on ventilator  Airway and Oxygen Therapy: Patient remains intubated per anesthesia plan and Patient placed on Ventilator (see vital sign flow sheet for setting)  Post-op Pain: none  Post-op Assessment: Post-op Vital signs reviewed and Patient's Cardiovascular Status Stable  Post-op Vital Signs: stable  Complications: No apparent anesthesia complications

## 2013-05-24 ENCOUNTER — Inpatient Hospital Stay (HOSPITAL_COMMUNITY): Payer: Medicaid Other

## 2013-05-24 ENCOUNTER — Encounter (HOSPITAL_COMMUNITY): Payer: Self-pay | Admitting: Thoracic Surgery (Cardiothoracic Vascular Surgery)

## 2013-05-24 LAB — POCT I-STAT 3, ART BLOOD GAS (G3+)
Bicarbonate: 17.2 mEq/L — ABNORMAL LOW (ref 20.0–24.0)
Bicarbonate: 17.6 mEq/L — ABNORMAL LOW (ref 20.0–24.0)
Bicarbonate: 17.8 mEq/L — ABNORMAL LOW (ref 20.0–24.0)
Bicarbonate: 19.3 mEq/L — ABNORMAL LOW (ref 20.0–24.0)
O2 Saturation: 94 %
O2 Saturation: 96 %
O2 Saturation: 92 %
TCO2: 19 mmol/L (ref 0–100)
TCO2: 19 mmol/L (ref 0–100)
TCO2: 18 mmol/L (ref 0–100)
pCO2 arterial: 32.7 mmHg — ABNORMAL LOW (ref 35.0–45.0)
pCO2 arterial: 35.1 mmHg (ref 35.0–45.0)
pCO2 arterial: 35.8 mmHg (ref 35.0–45.0)
pCO2 arterial: 36.9 mmHg (ref 35.0–45.0)
pH, Arterial: 7.291 — ABNORMAL LOW (ref 7.350–7.450)
pH, Arterial: 7.328 — ABNORMAL LOW (ref 7.350–7.450)
pO2, Arterial: 78 mmHg — ABNORMAL LOW (ref 80.0–100.0)
pO2, Arterial: 70 mmHg — ABNORMAL LOW (ref 80.0–100.0)

## 2013-05-24 LAB — BASIC METABOLIC PANEL
BUN: 12 mg/dL (ref 6–23)
CO2: 19 mEq/L (ref 19–32)
Chloride: 110 mEq/L (ref 96–112)
Creatinine, Ser: 1.11 mg/dL — ABNORMAL HIGH (ref 0.50–1.10)
GFR calc Af Amer: 66 mL/min — ABNORMAL LOW (ref >90)
Glucose, Bld: 121 mg/dL — ABNORMAL HIGH (ref 70–99)
Potassium: 4.1 mEq/L (ref 3.5–5.1)

## 2013-05-24 LAB — CREATININE, SERUM
Creatinine, Ser: 1.14 mg/dL — ABNORMAL HIGH (ref 0.50–1.10)
GFR calc non Af Amer: 55 mL/min — ABNORMAL LOW (ref >90)

## 2013-05-24 LAB — CBC
HCT: 31.6 % — ABNORMAL LOW (ref 36.0–46.0)
Hemoglobin: 10.7 g/dL — ABNORMAL LOW (ref 12.0–15.0)
Hemoglobin: 11.4 g/dL — ABNORMAL LOW (ref 12.0–15.0)
MCH: 27 pg (ref 26.0–34.0)
MCV: 81 fL (ref 78.0–100.0)
Platelets: 107 10*3/uL — ABNORMAL LOW (ref 150–400)
RBC: 4.22 MIL/uL (ref 3.87–5.11)
RDW: 16.2 % — ABNORMAL HIGH (ref 11.5–15.5)
WBC: 17.7 10*3/uL — ABNORMAL HIGH (ref 4.0–10.5)

## 2013-05-24 LAB — POCT I-STAT, CHEM 8
BUN: 11 mg/dL (ref 6–23)
Calcium, Ion: 1.27 mmol/L — ABNORMAL HIGH (ref 1.12–1.23)
Chloride: 104 mEq/L (ref 96–112)
Creatinine, Ser: 1.2 mg/dL — ABNORMAL HIGH (ref 0.50–1.10)
Glucose, Bld: 108 mg/dL — ABNORMAL HIGH (ref 70–99)

## 2013-05-24 LAB — MAGNESIUM
Magnesium: 3.1 mg/dL — ABNORMAL HIGH (ref 1.5–2.5)
Magnesium: 2.2 mg/dL (ref 1.5–2.5)

## 2013-05-24 LAB — GLUCOSE, CAPILLARY: Glucose-Capillary: 102 mg/dL — ABNORMAL HIGH (ref 70–99)

## 2013-05-24 MED ORDER — WARFARIN - PHYSICIAN DOSING INPATIENT
Freq: Every day | Status: DC
  Administered 2013-05-26 – 2013-06-03 (×3)

## 2013-05-24 MED ORDER — WARFARIN SODIUM 2.5 MG PO TABS
2.5000 mg | ORAL_TABLET | Freq: Every day | ORAL | Status: DC
  Administered 2013-05-24 – 2013-05-30 (×7): 2.5 mg via ORAL
  Filled 2013-05-24 (×8): qty 1

## 2013-05-24 MED ORDER — SODIUM BICARBONATE 8.4 % IV SOLN
INTRAVENOUS | Status: AC
  Filled 2013-05-24: qty 50

## 2013-05-24 MED ORDER — FUROSEMIDE 10 MG/ML IJ SOLN
10.0000 mg/h | INTRAVENOUS | Status: AC
  Administered 2013-05-24 – 2013-05-25 (×2): 10 mg/h via INTRAVENOUS
  Filled 2013-05-24 (×3): qty 25

## 2013-05-24 MED ORDER — MORPHINE SULFATE 2 MG/ML IJ SOLN
2.0000 mg | INTRAMUSCULAR | Status: DC | PRN
  Administered 2013-05-24 (×3): 2 mg via INTRAVENOUS
  Filled 2013-05-24 (×3): qty 1

## 2013-05-24 MED ORDER — MILRINONE IN DEXTROSE 20 MG/100ML IV SOLN: 0.0000 ug/kg/min | INTRAVENOUS | Status: DC

## 2013-05-24 MED ORDER — POTASSIUM CHLORIDE 10 MEQ/50ML IV SOLN
10.0000 meq | INTRAVENOUS | Status: AC
  Administered 2013-05-24 (×3): 10 meq via INTRAVENOUS

## 2013-05-24 MED ORDER — INSULIN ASPART 100 UNIT/ML ~~LOC~~ SOLN
0.0000 [IU] | SUBCUTANEOUS | Status: DC
  Administered 2013-05-24: 2 [IU] via SUBCUTANEOUS

## 2013-05-24 MED ORDER — SODIUM CHLORIDE 0.9 % IJ SOLN
10.0000 mL | INTRAMUSCULAR | Status: DC | PRN
  Administered 2013-05-27 (×3): 20 mL
  Administered 2013-05-28: 10 mL
  Administered 2013-05-28: 20 mL

## 2013-05-24 MED ORDER — SODIUM CHLORIDE 0.9 % IJ SOLN
10.0000 mL | Freq: Two times a day (BID) | INTRAMUSCULAR | Status: DC
  Administered 2013-05-24 – 2013-05-26 (×4): 10 mL

## 2013-05-24 MED ORDER — POTASSIUM CHLORIDE 10 MEQ/50ML IV SOLN
10.0000 meq | INTRAVENOUS | Status: AC
  Administered 2013-05-24 (×2): 10 meq via INTRAVENOUS

## 2013-05-24 MED ORDER — FUROSEMIDE 10 MG/ML IJ SOLN
20.0000 mg | Freq: Four times a day (QID) | INTRAMUSCULAR | Status: DC
  Administered 2013-05-24 (×2): 20 mg via INTRAVENOUS
  Filled 2013-05-24 (×2): qty 2

## 2013-05-24 MED ORDER — LEVALBUTEROL HCL 0.63 MG/3ML IN NEBU
0.6300 mg | INHALATION_SOLUTION | Freq: Four times a day (QID) | RESPIRATORY_TRACT | Status: DC | PRN
  Filled 2013-05-24: qty 3

## 2013-05-24 MED ORDER — SODIUM BICARBONATE 8.4 % IV SOLN
50.0000 meq | Freq: Once | INTRAVENOUS | Status: AC
  Administered 2013-05-24: 50 meq via INTRAVENOUS
  Filled 2013-05-24: qty 50

## 2013-05-24 MED ORDER — POTASSIUM CHLORIDE 10 MEQ/50ML IV SOLN
INTRAVENOUS | Status: AC
  Administered 2013-05-24: 10 meq via INTRAVENOUS
  Filled 2013-05-24: qty 150

## 2013-05-24 MED FILL — Heparin Sodium (Porcine) Inj 1000 Unit/ML: INTRAMUSCULAR | Qty: 30 | Status: AC

## 2013-05-24 MED FILL — Sodium Chloride IV Soln 0.9%: INTRAVENOUS | Qty: 1000 | Status: AC

## 2013-05-24 MED FILL — Sodium Chloride Irrigation Soln 0.9%: Qty: 3000 | Status: AC

## 2013-05-24 MED FILL — Electrolyte-R (PH 7.4) Solution: INTRAVENOUS | Qty: 3000 | Status: AC

## 2013-05-24 MED FILL — Magnesium Sulfate Inj 50%: INTRAMUSCULAR | Qty: 10 | Status: AC

## 2013-05-24 MED FILL — Heparin Sodium (Porcine) Inj 1000 Unit/ML: INTRAMUSCULAR | Qty: 20 | Status: AC

## 2013-05-24 MED FILL — Lidocaine HCl IV Inj 20 MG/ML: INTRAVENOUS | Qty: 5 | Status: AC

## 2013-05-24 MED FILL — Mannitol IV Soln 20%: INTRAVENOUS | Qty: 500 | Status: AC

## 2013-05-24 MED FILL — Potassium Chloride Inj 2 mEq/ML: INTRAVENOUS | Qty: 40 | Status: AC

## 2013-05-24 MED FILL — Sodium Bicarbonate IV Soln 8.4%: INTRAVENOUS | Qty: 50 | Status: AC

## 2013-05-24 NOTE — Progress Notes (Signed)
[facility] BRIEF SICU PROGRESS NOTE  1 Day Post-Op  S/P Procedure(s) (LRB): MINIMALLY INVASIVE TRICUSPID VALVE REPAIR (Right) INTRAOPERATIVE TRANSESOPHAGEAL ECHOCARDIOGRAM (N/A) HERNIA REPAIR FEMORAL (Right) MITRAL VALVE (MV) REPLACEMENT (N/A)   Feels pretty well.  Mild soreness in chest. Some dyspnea with ambulation AV paced rhythm.  BP stable off all drips Modest diuresis so far  Plan: Will start lasix drip  Cambell Stanek H 05/24/2013 6:55 PM

## 2013-05-24 NOTE — Progress Notes (Signed)
      [address]       [name] [address]             [phone]        CARDIOTHORACIC SURGERY PROGRESS NOTE   R1 Day Post-Op Procedure(s) (LRB): MINIMALLY INVASIVE TRICUSPID VALVE REPAIR (Right) INTRAOPERATIVE TRANSESOPHAGEAL ECHOCARDIOGRAM (N/A) HERNIA REPAIR FEMORAL (Right) MITRAL VALVE (MV) REPLACEMENT (N/A)  Subjective: Reportedly slow to wake up, just extubated 2 hours ago.  Currently awake and alert, looks good.  Minimal pain.  Denies SOB, nausea.  Objective: Vital signs: BP Readings from Last 1 Encounters:  05/24/13 96/56   Pulse Readings from Last 1 Encounters:  05/24/13 81   Resp Readings from Last 1 Encounters:  05/24/13 13   Temp Readings from Last 1 Encounters:  05/24/13 99.1 F (37.3 C)     Hemodynamics: PAP: (28-50)/(10-22) 37/16 mmHg CO:  [3.7 L/min-5 L/min] 5 L/min CI:  [2.9 L/min/m2-3.5 L/min/m2] 3.5 L/min/m2  Physical Exam:  Rhythm:   sinus  Breath sounds: Few scattered rhonchi  Heart sounds:  RRR w/ mechanical sounds  Incisions:  Dressings dry, intact  Abdomen:  Soft, non-distended, non-tender  Extremities:  Warm, well-perfused    Intake/Output from previous day: 08/28 0701 - 08/29 0700 In: 5774.1 [I.V.:3896.6; Blood:867.5; NG/GT:60; IV Piggyback:950] Out: 3320 [Urine:2120; Blood:750; Chest Tube:450] Intake/Output this shift:    Lab Results:  Recent Labs  05/23/13 2300 05/23/13 2319 05/24/13 0435  WBC 15.5*  --  17.7*  HGB 10.7* 10.9* 10.7*  HCT 31.7* 32.0* 31.6*  PLT 103*  --  114*   BMET:  Recent Labs  05/23/13 2319 05/24/13 0435  NA 143 139  K 3.9 4.1  CL 113* 110  CO2  --  19  GLUCOSE 129* 121*  BUN 10 12  CREATININE 1.20* 1.11*  CALCIUM  --  8.3*    CBG (last 3)   Recent Labs  05/23/13 1857 05/23/13 2319 05/24/13 0433  GLUCAP 109* 122* 105*   ABG    Component Value Date/Time   PHART 7.291* 05/24/2013 0644   PCO2ART 35.8 05/24/2013 0644   PO2ART 70.0* 05/24/2013 0644   HCO3 17.2*  05/24/2013 0644   TCO2 18 05/24/2013 0644   ACIDBASEDEF 8.0* 05/24/2013 0644   O2SAT 92.0 05/24/2013 0644   CXR: Mild pulm vasc congestion + RLL opacity  Assessment/Plan: S/P Procedure(s) (LRB): MINIMALLY INVASIVE TRICUSPID VALVE REPAIR (Right) INTRAOPERATIVE TRANSESOPHAGEAL ECHOCARDIOGRAM (N/A) HERNIA REPAIR FEMORAL (Right) MITRAL VALVE (MV) REPLACEMENT (N/A)  Overall doing well POD1 Maintaining NSR w/ stable hemodynamics on low dose milrinone and neo drips Expected post op acute blood loss anemia, mild, stable Expected post op volume excess History of SVT in past, brief episode Afib in OR   Mobilize  Pulm toilet  Wean drips  Diuresis  Start coumadin  Continue amiodarone IV today then convert to oral tomorrow    OWEN,CLARENCE H 05/24/2013 7:46 AM

## 2013-05-24 NOTE — Care Management Note (Signed)
    Page 1 of 1   06/04/2013     2:11:02 PM   CARE MANAGEMENT NOTE 06/04/2013  Patient:  Mary [name], Mary [name]   Account Number:  [id number]  Date Initiated:  05/24/2013  Documentation initiated by:  [facility]  Subjective/Objective Assessment:   MVR on 8-28     Action/Plan:   Anticipated DC Date:  06/04/2013   Anticipated DC Plan:  HOME W HOME HEALTH SERVICES      DC Planning Services  CM consult      Choice offered to / List presented to:             Status of service:  Completed, signed off Medicare Important Message given?   (If response is "NO", the following Medicare IM given date fields will be blank) Date Medicare IM given:   Date Additional Medicare IM given:    Discharge Disposition:  HOME/SELF CARE  Per UR Regulation:  Reviewed for med. necessity/level of care/duration of stay  If discussed at Long Length of Stay Meetings, dates discussed:    Comments:  ContactJudeen, Geralds Mother [phone]   Shanisha, Lech Sister [phone]  06/04/13 Rosalita Chessman [phone] PT FOR DC HOME TODAY.  SHE STATES SHE HAS NOW ARRANGED 24H CARE AT DC.  NO HOME NEEDS.  05-24-13 3:25pm Avie Arenas, RNBSN [phone] Talked with patient who is sitting up in chair.  States mother is in [place], sister is here now but is going back home to [place] on monday.  She lives with a 73 yo son who goes to school and is with his father at this time.  Does not have anyone else that can stay with her post discharge.  SW consulted for possible AL or SNF on discharge.  patient is agreeable to this. SW consult placed.  Asked nurse to obtain PT/OT referrals.

## 2013-05-24 NOTE — Progress Notes (Addendum)
INITIAL NUTRITION ASSESSMENT  DOCUMENTATION CODES Per approved criteria  -Not Applicable   INTERVENTION: RD to continue to follow along for diet advancement and need for oral nutrition supplements.  NUTRITION DIAGNOSIS: Inadequate oral intake related to limited appetite and diet as evidenced by pt report and diet provision.  Goal: Advance diet as tolerated. Intake to meet at least 90% of estimated needs.  Monitor:  weight trends, lab trends, I/O's, diet advancement, PO intake  Reason for Assessment: Malnutrition Screening Tool  52 y.o. female  Admitting Dx: S/P mitral valve replacement with metallic valve  ASSESSMENT: Pt is recently s/p multiple teeth extractions with alveoloplasty and preprosthetic surgery on 04/04/13.  Work-up reveals severe symptomatic mitral regurgitation. Admitted for scheduled mitral valve repair/replacement.  Underwent the following on 8/28: MITRAL VALVE REPLACEMENT  TRICUSPID VALVE REPAIR HERNIA REPAIR FEMORAL  Extubated early this morning. Pt with 9% wt loss x 1 month, she attributes her weight loss to eating poorly in the hospital - was admitted 6/20-7/1. Confirms that she ate better once she went home, however she continued to lose weight, possibly fluid related. She is currently tolerating her clear liquids and is planning to be advanced to solids this afternoon, per RN. Unable to complete Nutrition Focused Physical Exam at this time, thus unable to dx with malnutrition at this time. Pt is at nutrition risk given ongoing weight loss and remote hx of poor oral intake.   Height: Ht Readings from Last 1 Encounters:  05/20/13 5\' 2"  (1.575 m)    Weight: Wt Readings from Last 1 Encounters:  05/24/13 128 lb 4.9 oz (58.2 kg)  Weight likely elevated, wt was 103 lb <1 week PTA  Ideal Body Weight: 110 lb  % Ideal Body Weight: 116%  Wt Readings from Last 25 Encounters:  05/24/13 128 lb 4.9 oz (58.2 kg)  05/24/13 128 lb 4.9 oz (58.2 kg)  05/20/13  103 lb (46.72 kg)  05/20/13 103 lb (46.72 kg)  04/24/13 111 lb (50.349 kg)  04/22/13 110 lb (49.896 kg)  04/15/13 113 lb (51.256 kg)  04/11/13 113 lb (51.256 kg)  04/02/13 113 lb 14.4 oz (51.665 kg)  03/26/13 108 lb 7.5 oz (49.2 kg)  03/26/13 108 lb 7.5 oz (49.2 kg)  03/26/13 108 lb 7.5 oz (49.2 kg)   Usual Body Weight: 113 lb  % Usual Body Weight: 91%  BMI:  Body mass index is 23.46 kg/(m^2). WNL  Estimated Nutritional Needs: Kcal: 1400 - 1600  Protein: 56 - 66 g Fluid: 1.4 - 1.6 liters  Skin: chest and groin incision  Diet Order: Clear Liquid  EDUCATION NEEDS: -No education needs identified at this time   Intake/Output Summary (Last 24 hours) at 05/24/13 1413 Last data filed at 05/24/13 1300  Gross per 24 hour  Intake 4548.31 ml  Output   3815 ml  Net 733.31 ml    Last BM: PTA  Labs:   Recent Labs Lab 05/20/13 1007  05/23/13 1553 05/23/13 2300 05/23/13 2319 05/24/13 0435  NA 136  < > 143  --  143 139  K 4.3  < > 4.1  --  3.9 4.1  CL 102  --   --   --  113* 110  CO2 20  --   --   --   --  19  BUN 17  --   --   --  10 12  CREATININE 1.11*  --   --   --  1.20* 1.11*  CALCIUM 9.9  --   --   --   --  8.3*  MG  --   --   --  3.1*  --  2.8*  GLUCOSE 105*  < > 54*  --  129* 121*  < > = values in this interval not displayed.  CBG (last 3)   Recent Labs  05/23/13 2319 05/24/13 0433 05/24/13 0759  GLUCAP 122* 105* 134*    Scheduled Meds: . acetaminophen  1,000 mg Oral Q6H  . aspirin EC  325 mg Oral Daily  . bisacodyl  10 mg Oral Daily   Or  . bisacodyl  10 mg Rectal Daily  . cefUROXime (ZINACEF)  IV  1.5 g Intravenous Q12H  . docusate sodium  200 mg Oral Daily  . famotidine (PEPCID) IV  20 mg Intravenous Q12H  . furosemide  20 mg Intravenous Q6H  . insulin aspart  0-24 Units Subcutaneous Q4H  . insulin aspart  0-24 Units Subcutaneous Q4H  . insulin regular  0-10 Units Intravenous TID WC  . metoCLOPramide (REGLAN) injection  10 mg Intravenous  Q6H  . [START ON 05/25/2013] pantoprazole  40 mg Oral Daily  . sodium chloride  3 mL Intravenous Q12H  . warfarin  2.5 mg Oral q1800  . Warfarin - Physician Dosing Inpatient   Does not apply q1800    Continuous Infusions: . sodium chloride    . amiodarone (NEXTERONE PREMIX) 360 mg/200 mL dextrose 30 mg/hr (05/24/13 0912)  . insulin (NOVOLIN-R) infusion Stopped (05/23/13 1600)  . milrinone Stopped (05/24/13 1230)  . phenylephrine (NEO-SYNEPHRINE) Adult infusion Stopped (05/24/13 0900)    Past Medical History  Diagnosis Date  . Heart murmur   . CAP (community acquired pneumonia) 03/15/2013    "maybe; that's what they are thinking today" (03/15/2013)  . Asthma     "as a baby"   . Orthopnoea 03/15/2013    "in the last 2 days" (03/15/2013)  . Anemia   . History of blood transfusion     "14 w/1st pregnancy; 2 w/last C-section" (03/15/2013)  . GERD (gastroesophageal reflux disease)   . Hypertension     no pcp   will go to [facility]  . Severe mitral regurgitation 04/22/2013  . SVT (supraventricular tachycardia) 03/16/2013  . Acute diastolic heart failure 03/15/2013  . 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 Tricuspid regurgitation   . 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    33mm Sorin Carbomedics mechanical prosthesis via right mini thoracotomy approach  . 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 Appendectomy  ~1978  . Cesarean section  1983; 1999  . Cholecystectomy  2000's  . Tubal ligation  1999  . Tee without cardioversion N/A 03/18/2013    Procedure: TRANSESOPHAGEAL ECHOCARDIOGRAM (TEE);  Surgeon: Laurey Morale, MD;  Location: [facility] ENDOSCOPY;  Service: Cardiovascular;  Laterality: N/A;  . Cardiac catheterization    .  Multiple extractions with alveoloplasty N/A 04/04/2013    Procedure: Extraction of tooth #'s 1,8,9,13,14,15,23,24,25,26 with alveoloplasty and gross debridement of remaining teeth;  Surgeon: Charlynne Pander, DDS;  Location: [facility] OR;  Service: Oral Surgery;  Laterality: N/A;  . Minimally invasive tricuspid valve repair Right 05/23/2013    Procedure: MINIMALLY INVASIVE TRICUSPID VALVE REPAIR;  Surgeon: Purcell Nails, MD;  Location: [facility] OR;  Service: Open Heart Surgery;  Laterality: Right;  . Intraoperative  transesophageal echocardiogram N/A 05/23/2013    Procedure: INTRAOPERATIVE TRANSESOPHAGEAL ECHOCARDIOGRAM;  Surgeon: Purcell Nails, MD;  Location: [facility] OR;  Service: Open Heart Surgery;  Laterality: N/A;  . Femoral hernia repair Right 05/23/2013    Procedure: HERNIA REPAIR FEMORAL;  Surgeon: Purcell Nails, MD;  Location: [facility] OR;  Service: Open Heart Surgery;  Laterality: Right;  . Mitral valve replacement N/A 05/23/2013    Procedure: MITRAL VALVE (MV) REPLACEMENT;  Surgeon: Purcell Nails, MD;  Location: [facility] OR;  Service: Open Heart Surgery;  Laterality: N/A;    Jarold Motto MS, RD, LDN Pager: [phone] After-hours pager: [phone]

## 2013-05-24 NOTE — Progress Notes (Signed)
Patient weaned down to PS/CPAP on 5/5  40%.  RN got gas and PaO2 was 57.  Patient placed back on full support with an increased FIO2 in attempt to bring oxygen level up.  RT will continue to monitor and assess for weaning.

## 2013-05-24 NOTE — Procedures (Signed)
Extubation Procedure Note  Patient Details:   Name: Mary [name] DOB: Dec 14, 1960 MRN: [id number]   Airway Documentation:     Evaluation  O2 sats: stable throughout Complications: No apparent complications Patient did tolerate procedure well. Bilateral Breath Sounds: Clear Suctioning: Airway Yes  Patient extubated to 4L [place].  Sats 97%.  Positive cuff leak present.  NIF -20, VC 950.  Patient felt as if she was going to throw up prior to extubation.  Patient able to speak after extubation.  IS 450 times 5.  Patient had several thick green secretions.  Able to cough up secretions.  RT will continue to monitor.  Durwin Glaze 05/24/2013, 5:43 AM

## 2013-05-24 NOTE — Progress Notes (Signed)
Anesthesiology Follow-up:  Awake and alert. Sitting in chair, neuro intact. Taking PO well. Milrinone and neosynephrine weaned off.  VS: T- 36.8 BP 107/78 HR 70 (a-paced) RR 11 O2 Sat 97% on 3L  K-4.1 BUN/Cr.-12/1.11 H/H- 10.7/31 Plts 114,000  As noted patient not extubated until this AM at 05:45 due to sleepiness and low PO2. She now looks good. Pain adequately controlled, stable  respiratory and hemdynamic parmeters.  Kipp Brood, MD

## 2013-05-25 ENCOUNTER — Inpatient Hospital Stay (HOSPITAL_COMMUNITY): Payer: Medicaid Other

## 2013-05-25 LAB — GLUCOSE, CAPILLARY: Glucose-Capillary: 103 mg/dL — ABNORMAL HIGH (ref 70–99)

## 2013-05-25 LAB — BASIC METABOLIC PANEL
BUN: 14 mg/dL (ref 6–23)
Chloride: 102 mEq/L (ref 96–112)
Glucose, Bld: 100 mg/dL — ABNORMAL HIGH (ref 70–99)
Potassium: 4.6 mEq/L (ref 3.5–5.1)

## 2013-05-25 LAB — CBC
HCT: 33.7 % — ABNORMAL LOW (ref 36.0–46.0)
Hemoglobin: 11.3 g/dL — ABNORMAL LOW (ref 12.0–15.0)
MCH: 27.4 pg (ref 26.0–34.0)
MCHC: 33.5 g/dL (ref 30.0–36.0)
MCV: 81.8 fL (ref 78.0–100.0)

## 2013-05-25 NOTE — Progress Notes (Signed)
[address]       [name] [address]             [phone]        CARDIOTHORACIC SURGERY PROGRESS NOTE   R2 Days Post-Op Procedure(s) (LRB): MINIMALLY INVASIVE TRICUSPID VALVE REPAIR (Right) INTRAOPERATIVE TRANSESOPHAGEAL ECHOCARDIOGRAM (N/A) HERNIA REPAIR FEMORAL (Right) MITRAL VALVE (MV) REPLACEMENT (N/A)  Subjective: Feels better.  Mild soreness in chest.  Ambulating better w/ improved breathing.  Objective: Vital signs: BP Readings from Last 1 Encounters:  05/25/13 116/71   Pulse Readings from Last 1 Encounters:  05/25/13 70   Resp Readings from Last 1 Encounters:  05/25/13 10   Temp Readings from Last 1 Encounters:  05/25/13 97.6 F (36.4 C) Oral    Hemodynamics:    Physical Exam:  Rhythm:   Sinus 50's w/ 3rd degree AV block - DDD pacing  Breath sounds: clear  Heart sounds:  RRR w/ mechanical sounds, no murmur  Incisions:  Dressings dry  Abdomen:  Soft, non-distended, non-tender  Extremities:  Warm, well-perfused    Intake/Output from previous day: 08/29 0701 - 08/30 0700 In: 1435.1 [P.O.:440; I.V.:595.1; IV Piggyback:400] Out: 3650 [Urine:3150; Chest Tube:500] Intake/Output this shift: Total I/O In: 130.1 [I.V.:80.1; IV Piggyback:50] Out: 510 [Urine:500; Chest Tube:10]  Lab Results:  CBC: Recent Labs  05/24/13 1850 05/25/13 0421  WBC 21.3* 21.5*  HGB 11.4* 11.3*  HCT 34.7* 33.7*  PLT 107* 121*    BMET:  Recent Labs  05/24/13 0435 05/24/13 1814 05/24/13 1850 05/25/13 0421  NA 139 140  --  136  K 4.1 3.7  --  4.6  CL 110 104  --  102  CO2 19  --   --  24  GLUCOSE 121* 108*  --  100*  BUN 12 11  --  14  CREATININE 1.11* 1.20* 1.14* 1.17*  CALCIUM 8.3*  --   --  8.9     CBG (last 3)   Recent Labs  05/24/13 2339 05/25/13 0406 05/25/13 0814  GLUCAP 102* 103* 97    ABG    Component Value Date/Time   PHART 7.291* 05/24/2013 0644   PCO2ART 35.8 05/24/2013 0644   PO2ART 70.0* 05/24/2013 0644   HCO3 17.2* 05/24/2013 0644   TCO2 23 05/24/2013 1814   ACIDBASEDEF 8.0* 05/24/2013 0644   O2SAT 92.0 05/24/2013 0644    CXR: *RADIOLOGY REPORT*  Clinical Data: Status post cardiac surgery  PORTABLE CHEST - 1 VIEW  Comparison: 05/24/13  Findings: Cardiac shadow is stable. Postsurgical changes are again  noted. A sheath is noted in the left jugular vein although the  Swan-Ganz catheter has been removed. A left-sided central venous  line is again identified and stable. The two right-sided  thoracostomy catheters are again seen. A tiny right-sided apical  pneumothorax is noted. Diffuse infiltrative changes again noted  throughout the right lung and stable. Left basilar atelectasis is  noted.  IMPRESSION:  Tiny right pneumothorax. Persistent right-sided infiltrates are  seen.  Original Report Authenticated By: Alcide Clever, M.D.   Assessment/Plan: S/P Procedure(s) (LRB): MINIMALLY INVASIVE TRICUSPID VALVE REPAIR (Right) INTRAOPERATIVE TRANSESOPHAGEAL ECHOCARDIOGRAM (N/A) HERNIA REPAIR FEMORAL (Right) MITRAL VALVE (MV) REPLACEMENT (N/A)  Overall doing well POD2 Maintaining stable hemodynamics although now in 3rd degree AV block, no further episodes of Afib or SVT Expected post op acute blood loss anemia, mild, stable Expected post op volume excess, mild, diuresing well on lasix drip Leukocytosis, likely reactive - stable Expected post op  atelectasis, mild RLL opacity improved   D/C amiodarone and beta blockers  Mobilize  Diuresis  Leave chest tubes 1 more day  Watch rhythm  Keep in ICU today since she's currently pacer-dependent   OWEN,Mary [name] 05/25/2013 10:43 AM

## 2013-05-25 NOTE — Evaluation (Signed)
Physical Therapy Evaluation Patient Details Name: Mary [name] MRN: [id number] DOB: 1961/01/22 Today's Date: 05/25/2013 Time: 4098-1191 PT Time Calculation (min): 23 min  PT Assessment / Plan / Recommendation History of Present Illness  minithoracotomy with mitral valve repair, tricuspid valve repair and right femoral hernia repair  Clinical Impression  Pt with good mobility but limited by pain and fatigue. Pt reported dizziness with standing but no significant change in BP with mobility and sats remained 91-98% on 2L throughout. Pt demonstrates decreased functional mobility, transfers and gait and will benefit from acute therapy to maximize function, gait and transfers prior to discharge to decrease burden of care and return pt to PLOF.    PT Assessment  Patient needs continued PT services    Follow Up Recommendations  SNF;Supervision for mobility/OOB    Does the patient have the potential to tolerate intense rehabilitation      Barriers to Discharge Decreased caregiver support      Equipment Recommendations  Rolling walker with 5" wheels    Recommendations for Other Services OT consult   Frequency Min 3X/week    Precautions / Restrictions Precautions Precautions: Fall Precaution Comments: CT, oxygen Restrictions Weight Bearing Restrictions:  (sternal precautions)   Pertinent Vitals/Pain 9/10 right chest and groin pain HR 70-75  sats 91-98 on 2L BP 99/63 (73) sitting prior to walk 96/57 (66) with gait      Mobility  Bed Mobility Bed Mobility: Not assessed Details for Bed Mobility Assistance: pt in recliner before and after session Transfers Transfers: Sit to Stand;Stand to Sit Sit to Stand: From chair/3-in-1;With armrests;4: Min assist Stand to Sit: 4: Min guard;To chair/3-in-1;With armrests Details for Transfer Assistance: cueing for hand placement, decreased use of RUE and safety Ambulation/Gait Ambulation/Gait Assistance: 4: Min guard Ambulation Distance  (Feet): 72 Feet Assistive device: Rolling walker Ambulation/Gait Assistance Details: cueing for posture and position in RW as well as cues for breathing technique and encouragement to increase distance Gait Pattern: Step-through pattern;Decreased stride length;Narrow base of support Gait velocity: decreased Stairs: No    Exercises     PT Diagnosis: Difficulty walking;Acute pain  PT Problem List: Decreased strength;Decreased activity tolerance;Decreased mobility;Decreased knowledge of use of DME;Cardiopulmonary status limiting activity PT Treatment Interventions: Gait training;Stair training;Functional mobility training;Therapeutic activities;Therapeutic exercise;Patient/family education;DME instruction     PT Goals(Current goals can be found in the care plan section) Acute Rehab PT Goals Patient Stated Goal: pt wants to return to helping cater PT Goal Formulation: With patient Time For Goal Achievement: 06/08/13 Potential to Achieve Goals: Good  Visit Information  Last PT Received On: 05/25/13 Assistance Needed: +1 History of Present Illness: minithoracotomy with mitral valve repair, tricuspid valve repair and right femoral hernia repair       Prior Functioning  Home Living Family/patient expects to be discharged to:: Skilled nursing facility Living Arrangements: Alone Available Help at Discharge: Family;Available PRN/intermittently Type of Home: Apartment Home Access: Stairs to enter Entrance Stairs-Number of Steps: 18 Entrance Stairs-Rails: Right;Left Home Layout: One level Home Equipment: None Prior Function Level of Independence: Independent Comments: pt hasn't worked for 2 months and does not Theatre manager: No difficulties    Copywriter, advertising Arousal/Alertness: Awake/alert Behavior During Therapy: WFL for tasks assessed/performed Overall Cognitive Status: Within Functional Limits for tasks assessed    Extremity/Trunk Assessment Upper  Extremity Assessment Upper Extremity Assessment: Generalized weakness Lower Extremity Assessment Lower Extremity Assessment: Generalized weakness Cervical / Trunk Assessment Cervical / Trunk Assessment: Normal   Balance  End of Session PT - End of Session Equipment Utilized During Treatment: Gait belt;Oxygen Activity Tolerance: Patient limited by fatigue Patient left: in chair;with call bell/phone within reach Nurse Communication: Mobility status  GP     Toney Sang [facility] 05/25/2013, 10:09 AM  Delaney Meigs, PT [phone]

## 2013-05-25 NOTE — Progress Notes (Signed)
Patient requests that foley catheter be left in place until after the Lasix drip is discontinued.  Lasix drip is to be stopped at 2000 tonight per Dr. Cornelius Moras order.

## 2013-05-25 NOTE — Progress Notes (Signed)
[facility] BRIEF SICU PROGRESS NOTE  2 Days Post-Op  S/P Procedure(s) (LRB): MINIMALLY INVASIVE TRICUSPID VALVE REPAIR (Right) INTRAOPERATIVE TRANSESOPHAGEAL ECHOCARDIOGRAM (N/A) HERNIA REPAIR FEMORAL (Right) MITRAL VALVE (MV) REPLACEMENT (N/A)   Stable day AV paced rhythm w/ stable BP Diuresing very well  Plan: Continue current plan  OWEN,CLARENCE H 05/25/2013 4:09 PM

## 2013-05-26 ENCOUNTER — Inpatient Hospital Stay (HOSPITAL_COMMUNITY): Payer: Medicaid Other

## 2013-05-26 LAB — CBC
HCT: 32.6 % — ABNORMAL LOW (ref 36.0–46.0)
Hemoglobin: 10.8 g/dL — ABNORMAL LOW (ref 12.0–15.0)
MCH: 27 pg (ref 26.0–34.0)
MCHC: 33.1 g/dL (ref 30.0–36.0)

## 2013-05-26 LAB — BASIC METABOLIC PANEL
BUN: 17 mg/dL (ref 6–23)
Chloride: 100 mEq/L (ref 96–112)
Glucose, Bld: 82 mg/dL (ref 70–99)
Potassium: 3.9 mEq/L (ref 3.5–5.1)

## 2013-05-26 MED ORDER — POTASSIUM CHLORIDE CRYS ER 20 MEQ PO TBCR
20.0000 meq | EXTENDED_RELEASE_TABLET | Freq: Two times a day (BID) | ORAL | Status: DC
  Administered 2013-05-27 – 2013-06-03 (×16): 20 meq via ORAL
  Filled 2013-05-26 (×20): qty 1

## 2013-05-26 MED ORDER — FUROSEMIDE 40 MG PO TABS: 40.0000 mg | ORAL_TABLET | Freq: Two times a day (BID) | ORAL | Status: DC

## 2013-05-26 MED ORDER — SODIUM CHLORIDE 0.9 % IJ SOLN
3.0000 mL | INTRAMUSCULAR | Status: DC | PRN
  Administered 2013-05-29: 3 mL via INTRAVENOUS

## 2013-05-26 MED ORDER — TRAMADOL HCL 50 MG PO TABS
50.0000 mg | ORAL_TABLET | ORAL | Status: DC | PRN
  Administered 2013-05-26 – 2013-06-04 (×28): 100 mg via ORAL
  Filled 2013-05-26 (×28): qty 2

## 2013-05-26 MED ORDER — SODIUM CHLORIDE 0.9 % IV SOLN: 250.0000 mL | INTRAVENOUS | Status: DC | PRN

## 2013-05-26 MED ORDER — MOVING RIGHT ALONG BOOK
Freq: Once | Status: AC
  Administered 2013-05-26: 1
  Filled 2013-05-26: qty 1

## 2013-05-26 MED ORDER — SODIUM CHLORIDE 0.9 % IJ SOLN
3.0000 mL | Freq: Two times a day (BID) | INTRAMUSCULAR | Status: DC
  Administered 2013-05-26 – 2013-05-31 (×10): 3 mL via INTRAVENOUS

## 2013-05-26 MED ORDER — FUROSEMIDE 40 MG PO TABS
40.0000 mg | ORAL_TABLET | Freq: Two times a day (BID) | ORAL | Status: DC
  Administered 2013-05-27 – 2013-06-03 (×16): 40 mg via ORAL
  Filled 2013-05-26 (×20): qty 1

## 2013-05-26 MED ORDER — FUROSEMIDE 10 MG/ML IJ SOLN
40.0000 mg | Freq: Four times a day (QID) | INTRAMUSCULAR | Status: AC
  Administered 2013-05-26 (×3): 40 mg via INTRAVENOUS
  Filled 2013-05-26 (×3): qty 4

## 2013-05-26 NOTE — Progress Notes (Signed)
Patient complains of Right inner thigh pain upon standing. Right inner thigh noted to be more swollen in size compared to left side, and firm to touch. Patient complains of tenderness to touch at Right inner thigh and warmth.  Right groin site clean/dry/intact with dermabond intact. No hematoma or active bleeding noted.  Patient placed back to bed. Safety maintained. SCDs to LLE at this time until RLE further assessed per Dr. Cornelius Moras.  Dr. Cornelius Moras called and made aware of Right inner thigh findings.

## 2013-05-26 NOTE — Progress Notes (Addendum)
Dr. Cornelius Moras has been at bedside and assessed Right inner thigh. No new orders received.  BLE SCDs on.

## 2013-05-26 NOTE — Progress Notes (Signed)
Verbal/phone report given to RN on 2W. Patient to be transferred to 2W22 via wheelchair to bed and on monitor.  All patient belongings to be sent with patient to 2W22.

## 2013-05-26 NOTE — Progress Notes (Signed)
Patient transferred to room 2W22 on monitor; via wheelchair, and ambulated to chair.  All patient belongings including cell phone, glasses and purse carried by patient to room 2W22.  VSS. No distress noted. Safety maintained. Family updated per patient.  All meds, chart and SCD machine given to nurse. Asher Muir, RN" resuming care.

## 2013-05-26 NOTE — Progress Notes (Signed)
[address]       [name] [address]             [phone]        CARDIOTHORACIC SURGERY PROGRESS NOTE   R3 Days Post-Op Procedure(s) (LRB): MINIMALLY INVASIVE TRICUSPID VALVE REPAIR (Right) INTRAOPERATIVE TRANSESOPHAGEAL ECHOCARDIOGRAM (N/A) HERNIA REPAIR FEMORAL (Right) MITRAL VALVE (MV) REPLACEMENT (N/A)  Subjective: Looks good.  Feels better.  Mild soreness in chest.  Marginal appetite.  No SOB.  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  Excellent pacer thresholds.  Breath sounds: Mildly diminished at bases  Heart sounds:  RRR w/ mechanical sounds  Incisions:  Clean and dry  Abdomen:  Soft, non-distended, non-tender  Extremities:  Warm, well-perfused    Intake/Output from previous day: 08/30 0701 - 08/31 0700 In: 920.1 [P.O.:540; I.V.:330.1; IV Piggyback:50] Out: 2335 [Urine:2275; Chest Tube:60] Intake/Output this shift: Total I/O In: 120 [P.O.:120] Out: 200 [Chest Tube:200]  Lab Results:  CBC: Recent Labs  05/25/13 0421 05/26/13 0400  WBC 21.5* 16.6*  HGB 11.3* 10.8*  HCT 33.7* 32.6*  PLT 121* 142*    BMET:  Recent Labs  05/25/13 0421 05/26/13 0400  NA 136 136  K 4.6 3.9  CL 102 100  CO2 24 28  GLUCOSE 100* 82  BUN 14 17  CREATININE 1.17* 1.12*  CALCIUM 8.9 9.1     CBG (last 3)   Recent Labs  05/25/13 0406 05/25/13 0814 05/26/13 0419  GLUCAP 103* 97 87    ABG    Component Value Date/Time   PHART 7.291* 05/24/2013 0644   PCO2ART 35.8 05/24/2013 0644   PO2ART 70.0* 05/24/2013 0644   HCO3 17.2* 05/24/2013 0644   TCO2 23 05/24/2013 1814   ACIDBASEDEF 8.0* 05/24/2013 0644   O2SAT 92.0 05/24/2013 0644     CXR:  Trudie Reed, MD Sun May 26, 2013 8:05:41 AM EDT       **ADDENDUM** CREATED: 05/26/2013 07:57:35  CORRECTION FOR PREVIOUS REPORT ERRONEOUSLY DICTATED FOR THIS X-RAY  (PRIOR REPORT WAS FOR A DIFFERENT PATIENT):  *RADIOLOGY REPORT*  Clinical Data: Follow up cardiac surgery.  PORTABLE CHEST - 1 VIEW  Comparison: Chest x-ray 05/25/2013.  Findings: Right-sided chest tube is in position with tip in the  right apex. No additional drain extends across the lower right  hemithorax traversing the midline terminating in the lower left  hemithorax, possibly a mediastinal or pericardial drains. Lung  volumes are within normal limits. Multifocal interstitial and  airspace opacities are noted throughout the right mid to lower  lung, unchanged, potentially related to resolving areas of  atelectasis and/or re-expansion pulmonary edema. Small left  pleural effusion. No appreciable pneumothorax is identified at  this time. Mild enlargement of the cardiopericardial silhouette is  similar to the prior study. Upper mediastinal contours are within  normal limits. There is a left-sided internal jugular central  venous catheter with tip terminating in the proximal superior vena  cava.Previously noted left internal jugular central venous Cordis  has been removed. Epicardial pacing wires remain in position.  Mechanical mitral valve and tricuspid annuloplasty are noted.  IMPRESSION:  1. Support  apparatus and postoperative changes, as above.  2. Allowing for slight differences in patient positioning, the  radiographic appearance of the chest is essentially unchanged, as  above.  **END ADDENDUM** SIGNED BY: Florencia Reasons, M.D.     Assessment/Plan: S/P Procedure(s) (LRB): MINIMALLY INVASIVE TRICUSPID VALVE REPAIR (Right) INTRAOPERATIVE TRANSESOPHAGEAL ECHOCARDIOGRAM (N/A) HERNIA REPAIR FEMORAL (Right) MITRAL VALVE (MV) REPLACEMENT (N/A)  Overall doing well POD3 Maintaining stable  hemodynamics although still in 3rd degree AV block, no further episodes of Afib or SVT  Expected post op acute blood loss anemia, mild, stable  Expected post op volume excess, mild, diuresing well on lasix drip  Leukocytosis, likely reactive - improved Expected post op atelectasis, mild   Mobilize  Diuresis  D/C chest tubes   Watch rhythm  Continue coumadin Transfer step down   OWEN,CLARENCE H 05/26/2013 11:02 AM

## 2013-05-26 NOTE — Progress Notes (Signed)
Mary [name] ambulated with rolling walker and oxygen 2L around central nursing station, approx 150 feet.  She was independent except for managing O2 tank and pacemaker.  She tolerated the walk without shob or chest pain.  She did have some "burning" in her right thigh.  She stated "I feel good". Back to bed, call bell in reach. Instructed to call for assist for OOB.

## 2013-05-27 ENCOUNTER — Inpatient Hospital Stay (HOSPITAL_COMMUNITY): Payer: Medicaid Other

## 2013-05-27 LAB — CBC
HCT: 31.4 % — ABNORMAL LOW (ref 36.0–46.0)
MCH: 26.9 pg (ref 26.0–34.0)
MCHC: 32.8 g/dL (ref 30.0–36.0)
MCV: 82 fL (ref 78.0–100.0)
RDW: 17.3 % — ABNORMAL HIGH (ref 11.5–15.5)

## 2013-05-27 LAB — TYPE AND SCREEN
ABO/RH(D): O POS
Antibody Screen: NEGATIVE
Donor AG Type: NEGATIVE
Donor AG Type: NEGATIVE
PT AG Type: NEGATIVE
Unit division: 0

## 2013-05-27 LAB — BASIC METABOLIC PANEL
BUN: 16 mg/dL (ref 6–23)
Chloride: 99 mEq/L (ref 96–112)
Creatinine, Ser: 0.96 mg/dL (ref 0.50–1.10)
GFR calc Af Amer: 79 mL/min — ABNORMAL LOW (ref >90)

## 2013-05-27 MED ORDER — CLOTRIMAZOLE 1 % VA CREA
1.0000 | TOPICAL_CREAM | Freq: Every day | VAGINAL | Status: AC
  Administered 2013-05-27 – 2013-06-02 (×7): 1 via VAGINAL
  Filled 2013-05-27: qty 45

## 2013-05-27 MED ORDER — CLOTRIMAZOLE 2 % VA CREA
1.0000 | TOPICAL_CREAM | Freq: Every day | VAGINAL | Status: DC
  Filled 2013-05-27: qty 22.2

## 2013-05-27 MED ORDER — COUMADIN BOOK
Freq: Once | Status: AC
  Administered 2013-05-27: 14:00:00
  Filled 2013-05-27: qty 1

## 2013-05-27 NOTE — Progress Notes (Signed)
Clinical Social Work Department CLINICAL SOCIAL WORK PLACEMENT NOTE 05/27/2013  Patient:  Mary [name], Mary [name]  Account Number:  [id number] Admit date:  05/23/2013  Clinical Social Worker:  Carren Rang  Date/time:  05/27/2013 02:52 PM  Clinical Social Work is seeking post-discharge placement for this patient at the following level of care:   SKILLED NURSING   (*CSW will update this form in Epic as items are completed)   05/27/2013  Patient/family provided with [facility] Department of Clinical Social Work's list of facilities offering this level of care within the geographic area requested by the patient (or if unable, by the patient's family).  05/27/2013  Patient/family informed of their freedom to choose among providers that offer the needed level of care, that participate in Medicare, Medicaid or managed care program needed by the patient, have an available bed and are willing to accept the patient.  05/27/2013  Patient/family informed of [facility]' ownership interest in [facility], as well as of the fact that they are under no obligation to receive care at this facility.  PASARR submitted to EDS on 05/27/2013 PASARR number received from EDS on 05/27/2013  FL2 transmitted to all facilities in geographic area requested by pt/family on  05/27/2013 FL2 transmitted to all facilities within larger geographic area on   Patient informed that his/her managed care company has contracts with or will negotiate with  certain facilities, including the following:     Patient/family informed of bed offers received:   Patient chooses bed at  Physician recommends and patient chooses bed at    Patient to be transferred to  on   Patient to be transferred to facility by   The following physician request were entered in Epic:   Additional Comments:  Maree Krabbe, MSW, [employer] [phone]

## 2013-05-27 NOTE — Progress Notes (Signed)
05/27/13 Nursing note  On q dcd as ordered, patient tolerated well. Patient ambulated 300 feet x1 assist. Gait just slightly unsteady but tolerated well. Will continue to monitor patient. Esdras Delair, Randall An RN

## 2013-05-27 NOTE — Progress Notes (Signed)
Mary [name] complains of mild itching all over her body, "even on my head". No rash noted.  She feels this may be related to Tramadol or to hospital soap.  This did not disrupt her sleep.

## 2013-05-27 NOTE — Progress Notes (Signed)
Physical Therapy Treatment Patient Details Name: Mary [name] MRN: [id number] DOB: [birth year] Today's Date: 05/27/2013 Time: 4098-1191 PT Time Calculation (min): 28 min  PT Assessment / Plan / Recommendation  History of Present Illness minithoracotomy with mitral valve repair, tricuspid valve repair and right femoral hernia repair   PT Comments   Pt with excellent progression from eval. Pt with pain in Right thigh throughout but able to increase ambulation distance and perform HEp. Will attempt without Rw next session.    Follow Up Recommendations        Does the patient have the potential to tolerate intense rehabilitation     Barriers to Discharge        Equipment Recommendations       Recommendations for Other Services    Frequency     Progress towards PT Goals Progress towards PT goals: Progressing toward goals  Plan Current plan remains appropriate    Precautions / Restrictions Precautions Precautions: Fall Precaution Comments: oxygen Restrictions Weight Bearing Restrictions: Yes (sternal precautions)   Pertinent Vitals/Pain sats 92% on RA EOB, 2L with gait 95% HR 87 6/10 right thigh pain, premedicated    Mobility  Bed Mobility Bed Mobility: Supine to Sit Supine to Sit: 6: Modified independent (Device/Increase time);HOB flat Transfers Sit to Stand: 6: Modified independent (Device/Increase time);From bed Stand to Sit: 6: Modified independent (Device/Increase time);To bed Ambulation/Gait Ambulation/Gait Assistance: 5: Supervision Ambulation Distance (Feet): 400 Feet Assistive device: Rolling walker Ambulation/Gait Assistance Details: cueing for position in RW, continued slow gait Gait Pattern: Step-through pattern;Decreased stride length Gait velocity: decreased Stairs: No    Exercises General Exercises - Lower Extremity Long Arc Quad: AROM;10 reps;Right;Seated Hip Flexion/Marching: AROM;Right;10 reps;Seated   PT Diagnosis:    PT Problem List:   PT  Treatment Interventions:     PT Goals (current goals can now be found in the care plan section)    Visit Information  Last PT Received On: 05/27/13 Assistance Needed: +1 History of Present Illness: minithoracotomy with mitral valve repair, tricuspid valve repair and right femoral hernia repair    Subjective Data      Cognition  Cognition Arousal/Alertness: Awake/alert Behavior During Therapy: WFL for tasks assessed/performed Overall Cognitive Status: Within Functional Limits for tasks assessed    Balance     End of Session PT - End of Session Equipment Utilized During Treatment: Gait belt;Oxygen Activity Tolerance: Patient tolerated treatment well Patient left: in bed;with call bell/phone within reach;with family/visitor present Nurse Communication: Mobility status   GP     Toney Sang Beth 05/27/2013, 10:08 AM Delaney Meigs, PT [phone]

## 2013-05-27 NOTE — Progress Notes (Addendum)
[address]       [place] [address]             [phone]          4 Days Post-Op Procedure(s) (LRB): MINIMALLY INVASIVE TRICUSPID VALVE REPAIR (Right) INTRAOPERATIVE TRANSESOPHAGEAL ECHOCARDIOGRAM (N/A) HERNIA REPAIR FEMORAL (Right) MITRAL VALVE (MV) REPLACEMENT (N/A)  Subjective: Still feels weak, but "hanging in there". C/o "feels like I have a yeast infection".     Objective: Vital signs in last 24 hours: Patient Vitals for the past 24 hrs:  BP Temp Temp src Pulse Resp SpO2 Height Weight  05/27/13 0333 112/76 mmHg 98.1 F (36.7 C) Oral 86 16 95 % - 114 lb 12.8 oz (52.073 kg)  05/26/13 2008 102/62 mmHg 98.2 F (36.8 C) Oral 87 17 100 % - -  05/26/13 1620 108/65 mmHg 97.5 F (36.4 C) Oral 83 10 96 % - -  05/26/13 1500 111/67 mmHg - - 78 9 96 % 5\' 2"  (1.575 m) -  05/26/13 1400 - - - 84 14 99 % - -  05/26/13 1300 104/54 mmHg - - 88 20 97 % - -  05/26/13 1200 111/48 mmHg - - 81 15 97 % - -  05/26/13 1141 - 97.9 F (36.6 C) Oral - - - - -  05/26/13 1100 109/67 mmHg - - 80 12 97 % - -  05/26/13 1000 119/61 mmHg - - 79 12 93 % - -   Current Weight  05/27/13 114 lb 12.8 oz (52.073 kg)     Intake/Output from previous day: 08/31 0701 - 09/01 0700 In: 580 [P.O.:580] Out: 2600 [Urine:2400; Chest Tube:200]    PHYSICAL EXAM:  Heart: RRR, paced 80 Lungs: Slightly diminished in bases Wound: Clean and dry Extremities: No edema    Lab Results: CBC: Recent Labs  05/26/13 0400 05/27/13 0435  WBC 16.6* 11.9*  HGB 10.8* 10.3*  HCT 32.6* 31.4*  PLT 142* 195   BMET:  Recent Labs  05/26/13 0400 05/27/13 0435  NA 136 138  K 3.9 3.7  CL 100 99  CO2 28 31  GLUCOSE 82 79  BUN 17 16  CREATININE 1.12* 0.96  CALCIUM 9.1 9.1    PT/INR:  Recent Labs  05/27/13 0435  LABPROT 18.0*  INR 1.53*   CXR: Findings: The left-sided central venous line is again noted. There  is been interval removal of right-sided thoracostomy catheters.  No  pneumothorax is noted. Bibasilar atelectatic changes are seen  although the overall degree of aeration has improved significantly  from prior exam.  IMPRESSION:  Significant improvement in aeration. No pneumothorax following  chest tube removal is noted.    Assessment/Plan: S/P Procedure(s) (LRB): MINIMALLY INVASIVE TRICUSPID VALVE REPAIR (Right) INTRAOPERATIVE TRANSESOPHAGEAL ECHOCARDIOGRAM (N/A) HERNIA REPAIR FEMORAL (Right) MITRAL VALVE (MV) REPLACEMENT (N/A) CV- BPs stable, paced at present.  I tried to turn down her pacer, but she continues to have pacer spikes.  Probably could just disconnect and watch HR/rhythm.  Continue current meds. Vol overload- diurese. Leukocytosis- WBC trending down. No fever. CRPI, pulm toilet. Will Rx topical Monistat. D/c On-Q.   LOS: 4 days    COLLINS,GINA H 05/27/2013  I have seen and examined the patient and agree with the assessment and plan as outlined.  Overall making excellent progress.  However, still in 3rd degree AV block.  Escape rhythm better w/ HR 50-60's.  Will consult EP tomorrow if AV block persists but I remain hopeful  that it will resolve w/ time.  OWEN,CLARENCE H 05/27/2013 11:35 AM

## 2013-05-27 NOTE — Progress Notes (Signed)
Clinical Social Work Department BRIEF PSYCHOSOCIAL ASSESSMENT 05/27/2013  Patient:  Mary [name], Mary [name]     Account Number:  [id number]     Admit date:  05/23/2013  Clinical Social Worker:  Carren Rang  Date/Time:  05/27/2013 02:47 PM  Referred by:  Care Management  Date Referred:  05/27/2013 Referred for  SNF Placement   Other Referral:   Interview type:  Patient Other interview type:    PSYCHOSOCIAL DATA Living Status:  FAMILY Admitted from facility:   Level of care:   Primary support name:  Rettinger,Daisy Mother [phone] Primary support relationship to patient:  PARENT Degree of support available:   Family lives in [place], lives with 51 year old son who is with his father at this time.    CURRENT CONCERNS Current Concerns  Post-Acute Placement   Other Concerns:    SOCIAL WORK ASSESSMENT / PLAN CSW received referral for SNF placement. CSW went into room and introduced self and explained reason for visit. Patient states she is agreeable to SNF for rehab. Patient explained to CSW, "I will do whatver is recommended." CSW reviewed patients information and noted patient is medicaid only. CSW consulted with Warehouse manager of social work for assistance. CSW will complete FL2 for MD signature.   Assessment/plan status:  Psychosocial Support/Ongoing Assessment of Needs Other assessment/ plan:   Information/referral to community resources:   SNF information    PATIENT'S/FAMILY'S RESPONSE TO PLAN OF CARE: Patient is agreeable to be faxed out to facilities.       Maree Krabbe, MSW, [facility] [phone]

## 2013-05-28 ENCOUNTER — Encounter (HOSPITAL_COMMUNITY): Payer: Self-pay | Admitting: Thoracic Surgery (Cardiothoracic Vascular Surgery)

## 2013-05-28 DIAGNOSIS — I442 Atrioventricular block, complete: Secondary | ICD-10-CM

## 2013-05-28 DIAGNOSIS — Z8679 Personal history of other diseases of the circulatory system: Secondary | ICD-10-CM | POA: Diagnosis not present

## 2013-05-28 DIAGNOSIS — Z9889 Other specified postprocedural states: Secondary | ICD-10-CM

## 2013-05-28 DIAGNOSIS — Z954 Presence of other heart-valve replacement: Secondary | ICD-10-CM

## 2013-05-28 HISTORY — DX: Atrioventricular block, complete: I44.2

## 2013-05-28 LAB — PROTIME-INR: Prothrombin Time: 20.8 seconds — ABNORMAL HIGH (ref 11.6–15.2)

## 2013-05-28 NOTE — Progress Notes (Signed)
Pt walked independently in hallway 750 ft on room air and tolerated activity well. Will continue to monitor.

## 2013-05-28 NOTE — Progress Notes (Signed)
CSW provided only bed offer at [facility] in [place] to patient. Patient states she will talk to her mom about it and let CSW know what she decides. Patient also stated that she might be able to get family to stay with her for the 24 hour supervision. CSW will update when new information arises.  Maree Krabbe, MSW, [facility] [phone]

## 2013-05-28 NOTE — Progress Notes (Addendum)
      [address]       [place] [address]             [phone]      5 Days Post-Op Procedure(s) (LRB): MINIMALLY INVASIVE TRICUSPID VALVE REPAIR (Right) INTRAOPERATIVE TRANSESOPHAGEAL ECHOCARDIOGRAM (N/A) HERNIA REPAIR FEMORAL (Right) MITRAL VALVE (MV) REPLACEMENT (N/A)  Subjective:  Ms. Cadle has no complaints this morning.  She states she is doing fairly well.  She is ambulating +BM  Objective: Vital signs in last 24 hours: Temp:  [98.3 F (36.8 C)-98.9 F (37.2 C)] 98.9 F (37.2 C) (09/02 0529) Pulse Rate:  [84-86] 85 (09/02 0529) Cardiac Rhythm:  [-] Ventricular paced (09/01 2045) Resp:  [17-18] 18 (09/02 0529) BP: (93-107)/(59-61) 101/59 mmHg (09/02 0529) SpO2:  [94 %-99 %] 96 % (09/02 0529) Weight:  [108 lb 0.4 oz (49 kg)] 108 lb 0.4 oz (49 kg) (09/02 0529)  Intake/Output from previous day: 09/01 0701 - 09/02 0700 In: 1080 [P.O.:1080] Out: 400 [Urine:400]  General appearance: alert, cooperative and no distress Heart: regular rate and rhythm and paced Lungs: clear to auscultation bilaterally Abdomen: soft, non-tender; bowel sounds normal; no masses,  no organomegaly Extremities: edema trace Wound: clean and dry  Lab Results:  Recent Labs  05/26/13 0400 05/27/13 0435  WBC 16.6* 11.9*  HGB 10.8* 10.3*  HCT 32.6* 31.4*  PLT 142* 195   BMET:  Recent Labs  05/26/13 0400 05/27/13 0435  NA 136 138  K 3.9 3.7  CL 100 99  CO2 28 31  GLUCOSE 82 79  BUN 17 16  CREATININE 1.12* 0.96  CALCIUM 9.1 9.1    PT/INR:  Recent Labs  05/28/13 0500  LABPROT 20.8*  INR 1.85*   ABG    Component Value Date/Time   PHART 7.291* 05/24/2013 0644   HCO3 17.2* 05/24/2013 0644   TCO2 23 05/24/2013 1814   ACIDBASEDEF 8.0* 05/24/2013 0644   O2SAT 92.0 05/24/2013 0644   CBG (last 3)   Recent Labs  05/25/13 0814 05/26/13 0419  GLUCAP 97 87    Assessment/Plan: S/P Procedure(s) (LRB): MINIMALLY INVASIVE TRICUSPID VALVE REPAIR  (Right) INTRAOPERATIVE TRANSESOPHAGEAL ECHOCARDIOGRAM (N/A) HERNIA REPAIR FEMORAL (Right) MITRAL VALVE (MV) REPLACEMENT (N/A)  1. CV- remains paced, continued block under pacer- likely need EP consult 2. Pulm- no acute issues, encouraged continue use of IS 3. INR 1.85- continue Coumadin at 2.5 4. GU- ?yeast infection, Monistat providing relief per patient 5. Deconditioning- patient lives alone will need SNF at discharge 6. Dispo- patient doing well, continued heart block, EP consult? Continue current care   LOS: 5 days    BARRETT, ERIN 05/28/2013  I have seen and examined the patient and agree with the assessment and plan as outlined.  Still in 3rd degree AV block.  Will consult EPS  Catina Nuss H 05/28/2013 8:25 AM

## 2013-05-28 NOTE — Progress Notes (Signed)
Physical Therapy Treatment/ Discharge Patient Details Name: Mary [name] MRN: [id number] DOB: Feb 16, 1961 Today's Date: 05/28/2013 Time: 5621-3086 PT Time Calculation (min): 23 min  PT Assessment / Plan / Recommendation  History of Present Illness minithoracotomy with mitral valve repair, tricuspid valve repair and right femoral hernia repair   PT Comments   Pt able to complete all mobility without assist today. Pt educated for sequence of stair ambulation and was able to demonstrate without assist. HR 90-97 throughout with 2/10 Right thigh pain. Pt agreeable to no further need with all education completed. Pt encourage to continue ambulation on her own as well as HEp.  Follow Up Recommendations  No PT follow up     Does the patient have the potential to tolerate intense rehabilitation     Barriers to Discharge        Equipment Recommendations  None recommended by PT    Recommendations for Other Services    Frequency     Progress towards PT Goals Progress towards PT goals: Goals met/education completed, patient discharged from PT  Plan Discharge plan needs to be updated    Precautions / Restrictions Precautions Precautions: None   Pertinent Vitals/Pain     Mobility  Bed Mobility Bed Mobility: Not assessed Transfers Sit to Stand: 6: Modified independent (Device/Increase time);From chair/3-in-1 Stand to Sit: To chair/3-in-1 Ambulation/Gait Ambulation/Gait Assistance: 7: Independent Ambulation Distance (Feet): 700 Feet Assistive device: None Gait Pattern: Within Functional Limits Gait velocity: decreased Stairs: Yes Stairs Assistance: 6: Modified independent (Device/Increase time) Stair Management Technique: One rail Right Number of Stairs: 11    Exercises General Exercises - Lower Extremity Long Arc Quad: AROM;Right;Seated;20 reps Hip Flexion/Marching: AROM;Right;Seated;20 reps   PT Diagnosis:    PT Problem List:   PT Treatment Interventions:     PT Goals  (current goals can now be found in the care plan section)    Visit Information  Last PT Received On: 05/28/13 Assistance Needed: +1 History of Present Illness: minithoracotomy with mitral valve repair, tricuspid valve repair and right femoral hernia repair    Subjective Data      Cognition  Cognition Arousal/Alertness: Awake/alert Behavior During Therapy: WFL for tasks assessed/performed Overall Cognitive Status: Within Functional Limits for tasks assessed    Balance     End of Session PT - End of Session Activity Tolerance: Patient tolerated treatment well Patient left: in chair;with call bell/phone within reach Nurse Communication: Mobility status   GP     Toney Sang Beth 05/28/2013, 1:16 PM Delaney Meigs, PT [phone]

## 2013-05-28 NOTE — Progress Notes (Signed)
Pt just walked independently about 800 ft. No c/o. Will f/u in am. Encouraged third walk today. Ethelda Chick CES, ACSM 2:39 PM 05/28/2013

## 2013-05-28 NOTE — Consult Note (Signed)
ELECTROPHYSIOLOGY CONSULT NOTE    Patient ID: Mary [name] MRN: [id number], DOB/AGE: Feb 25, 1961 52 y.o.  Admit date: 05/23/2013 Date of Consult: 05-28-2013  Primary Physician: No PCP Per Patient Primary Cardiologist: Marca Ancona  Reason for Consultation: heart block s/p tricuspid valve repair and mitral valve replacement  HPI:  Mary [name] is a 52 year old female who was admitted in June of this year with heart failure. 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 She was treated with IV antibiotics and subsequently had negative blood cultures.  She was evaluated by Dr Cornelius Moras who felt that valve replacement was indicated.  She underwent mitral valve replacement and tricuspid valve repair on 05-23-2013.  Her post op course has been remarkable for persistent heart block.  She is currently P synchronous pacing with underlying complete heart block.   EP has been asked to evaluate for treatment options.  She currently denies chest pain, shortness of breath, dizziness, or palpitations.  She is ambulating without difficulty.   ROS is negative except as outlined above.   Past Medical History  Diagnosis Date  . Heart murmur   . CAP (community acquired pneumonia) 03/15/2013    "maybe; that's what they are thinking today" (03/15/2013)  . Asthma     "as a baby"   . Orthopnoea 03/15/2013    "in the last 2 days" (03/15/2013)  . Anemia   . History of blood transfusion     "14 w/1st pregnancy; 2 w/last C-section" (03/15/2013)  . GERD (gastroesophageal reflux disease)   . Hypertension     no pcp   will go to [facility]  . Severe mitral regurgitation 04/22/2013  . SVT (supraventricular tachycardia) 03/16/2013  . Acute diastolic heart failure 03/15/2013  . 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 Tricuspid regurgitation   . 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    33mm Sorin Carbomedics mechanical  prosthesis via right mini thoracotomy approach  . 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 Complete heart block 05/28/2013    Post-op     Surgical History:  Past Surgical History  Procedure Laterality Date  . Appendectomy  ~1978  . Cesarean section  1983; 1999  . Cholecystectomy  2000's  . Tubal ligation  1999  . Tee without cardioversion N/A 03/18/2013    Procedure: TRANSESOPHAGEAL ECHOCARDIOGRAM (TEE);  Surgeon: Laurey Morale, MD;  Location: [facility]COPY;  Service: Cardiovascular;  Laterality: N/A;  . Cardiac catheterization    . Multiple extractions with alveoloplasty N/A 04/04/2013    Procedure: Extraction of tooth #'s 1,8,9,13,14,15,23,24,25,26 with alveoloplasty and gross debridement of remaining teeth;  Surgeon: Charlynne Pander, DDS;  Location: [facility] OR;  Service: Oral Surgery;  Laterality: N/A;  . Minimally invasive tricuspid valve repair Right 05/23/2013    Procedure: MINIMALLY INVASIVE TRICUSPID VALVE REPAIR;  Surgeon: Purcell Nails, MD;  Location: [facility] OR;  Service: Open Heart Surgery;  Laterality: Right;  . Intraoperative transesophageal echocardiogram N/A 05/23/2013    Procedure: INTRAOPERATIVE TRANSESOPHAGEAL ECHOCARDIOGRAM;  Surgeon: Purcell Nails, MD;  Location: [facility] OR;  Service: Open Heart Surgery;  Laterality: N/A;  . Femoral hernia repair Right 05/23/2013    Procedure: HERNIA REPAIR FEMORAL;  Surgeon: Purcell Nails, MD;  Location: [facility] OR;  Service:  Open Heart Surgery;  Laterality: Right;  . Mitral valve replacement N/A 05/23/2013    Procedure: MITRAL VALVE (MV) REPLACEMENT;  Surgeon: Purcell Nails, MD;  Location: [facility] OR;  Service: Open Heart Surgery;  Laterality: N/A;     Prescriptions prior to admission  Medication Sig Dispense Refill  . amiodarone (PACERONE) 200 MG tablet Take 1 tablet (200 mg  total) by mouth 2 (two) times daily. Begin 7 days prior to surgery.  14 tablet  0  . aspirin 81 MG chewable tablet Chew 1 tablet (81 mg total) by mouth daily.      . chlorhexidine (PERIDEX) 0.12 % solution Rinse with 15 mls twice daily for 30 seconds. Use after breakfast and at bedtime. Spit out excess. Do not swallow.  960 mL  PRN  . furosemide (LASIX) 40 MG tablet Take 1 tablet (40 mg total) by mouth daily.  30 tablet  3  . ibuprofen (ADVIL,MOTRIN) 200 MG tablet Take 400 mg by mouth every 6 (six) hours as needed for pain.         Inpatient Medications:  . acetaminophen  1,000 mg Oral Q6H  . aspirin EC  325 mg Oral Daily  . bisacodyl  10 mg Oral Daily   Or  . bisacodyl  10 mg Rectal Daily  . clotrimazole  1 Applicatorful Vaginal QHS  . docusate sodium  200 mg Oral Daily  . furosemide  40 mg Oral BID  . pantoprazole  40 mg Oral Daily  . potassium chloride  20 mEq Oral BID  . sodium chloride  10-40 mL Intracatheter Q12H  . sodium chloride  3 mL Intravenous Q12H  . sodium chloride  3 mL Intravenous Q12H  . warfarin  2.5 mg Oral q1800  . Warfarin - Physician Dosing Inpatient   Does not apply q1800    Allergies:  Allergies  Allergen Reactions  . Oxycodone Itching    History   Social History  . Marital Status: Single    Spouse Name: N/A    Number of Children: N/A  . Years of Education: N/A   Occupational History  . Not on file.   Social History Main Topics  . Smoking status: Current Every Day Smoker -- 0.50 packs/day for 36 years    Types: Cigarettes    Last Attempt to Quit: 03/15/2013  . Smokeless tobacco: Never Used  . Alcohol Use: 3.6 oz/week    6 Glasses of wine per week     Comment: 03/15/2013 "bottle of wine/wk"  . Drug Use: No  . Sexual Activity: No   Other Topics Concern  . Not on file   Social History Narrative  . No narrative on file     History reviewed. No pertinent family history.   Physical Exam: Filed Vitals:   05/27/13 1704 05/27/13 1947  05/28/13 0529 05/28/13 1425  BP:  93/61 101/59 109/58  Pulse: 84 86 85 78  Temp:  98.9 F (37.2 C) 98.9 F (37.2 C) 98.4 F (36.9 C)  TempSrc:  Oral Oral Oral  Resp:  18 18 18   Height:      Weight:   108 lb 0.4 oz (49 kg)   SpO2: 99% 94% 96% 97%    GEN- The patient is well appearing, alert and oriented x 3 today.   Head- normocephalic, atraumatic Eyes-  Sclera clear, conjunctiva pink Ears- hearing intact Oropharynx- clear Neck- supple, L IJ CVL in place Lungs- Clear to ausculation bilaterally, normal work of breathing Heart- Regular rate  and rhythm  GI- soft, NT, ND, + BS Extremities- no clubbing, cyanosis, or edema MS- no significant deformity or atrophy Skin- no rash or lesion Psych- euthymic mood, full affect Neuro- strength and sensation are intact  Labs:   Lab Results  Component Value Date   WBC 11.9* 05/27/2013   HGB 10.3* 05/27/2013   HCT 31.4* 05/27/2013   MCV 82.0 05/27/2013   PLT 195 05/27/2013    Recent Labs Lab 05/27/13 0435  NA 138  K 3.7  CL 99  CO2 31  BUN 16  CREATININE 0.96  CALCIUM 9.1  GLUCOSE 79   Lab Results  Component Value Date   CKTOTAL 61 03/15/2013   CKMB 1.0 03/15/2013   TROPONINI <0.30 04/15/2013    Radiology/Studies: Dg Chest 2 View 05/27/2013   *RADIOLOGY REPORT*  Clinical Data: Follow up atelectasis.  CHEST - 2 VIEW  Comparison: 05/26/2013  Findings: The left-sided central venous line is again noted.  There is been interval removal of right-sided thoracostomy catheters.  No pneumothorax is noted.  Bibasilar atelectatic changes are seen although the overall degree of aeration has improved significantly from prior exam.  IMPRESSION: Significant improvement in aeration.  No pneumothorax following chest tube removal is noted.   Original Report Authenticated By: Alcide Clever, M.D.   EKG: 05/24/13- sinus rhythm 60 bpm, PR 270 msec, RBBB  TELEMETRY: P synchronous pacing with underlying complete heart block  Assessment and Plan: 1. Complete heart  block s/p MV replacement and TV repair She continues to have persistent AV block post op day 5 s/p valve surgery.  I suspect that she will require PPM implant.  Given h/o endocarditis it would be nice however to avoid PPM implant long term.  I think that waiting another 24-48 hours would be reasonable.  She continues to pace through her epicardial wires. I would recommend that her L IJ CVL be removed if possible to reduce risks of infection at time of PPM implant should this be required. Will follow with you.  Hillis Range, MD

## 2013-05-28 NOTE — Progress Notes (Signed)
Patient ambulated approximately 300 ft and tolerated well.  Slow, but steady on her feet.  No SOB, HR stable.  Left resting in room with call bell within reach.  Will continue to monitor.  Arva Chafe

## 2013-05-28 NOTE — Progress Notes (Signed)
Central line D/c'ed per MD order and protocol. Pt tolerated procedure well. Occlusive dressing applied. Pt instructed that dressing needs to stay on for 24 hours. Pt resting in bed with call bell within reach.Will continue to monitor.

## 2013-05-29 DIAGNOSIS — I059 Rheumatic mitral valve disease, unspecified: Secondary | ICD-10-CM

## 2013-05-29 LAB — PROTIME-INR: INR: 1.69 — ABNORMAL HIGH (ref 0.00–1.49)

## 2013-05-29 MED ORDER — CHLORHEXIDINE GLUCONATE 4 % EX LIQD
60.0000 mL | Freq: Once | CUTANEOUS | Status: AC
  Administered 2013-05-29: 4 via TOPICAL
  Filled 2013-05-29: qty 60

## 2013-05-29 MED ORDER — SODIUM CHLORIDE 0.9 % IV SOLN
INTRAVENOUS | Status: DC
  Administered 2013-05-30: 06:00:00 via INTRAVENOUS

## 2013-05-29 MED ORDER — CHLORHEXIDINE GLUCONATE 4 % EX LIQD
60.0000 mL | Freq: Once | CUTANEOUS | Status: AC
  Administered 2013-05-30: 4 via TOPICAL
  Filled 2013-05-29: qty 60

## 2013-05-29 MED ORDER — SODIUM CHLORIDE 0.9 % IR SOLN
80.0000 mg | Status: AC
  Filled 2013-05-29: qty 2

## 2013-05-29 MED ORDER — CEFAZOLIN SODIUM-DEXTROSE 2-3 GM-% IV SOLR
2.0000 g | INTRAVENOUS | Status: AC
  Filled 2013-05-29 (×2): qty 50

## 2013-05-29 MED ORDER — MENTHOL 3 MG MT LOZG
1.0000 | LOZENGE | OROMUCOSAL | Status: DC | PRN
  Filled 2013-05-29: qty 9

## 2013-05-29 MED ORDER — SODIUM CHLORIDE 0.9 % IV SOLN: INTRAVENOUS | Status: DC

## 2013-05-29 MED ORDER — ENOXAPARIN SODIUM 40 MG/0.4ML ~~LOC~~ SOLN
40.0000 mg | SUBCUTANEOUS | Status: DC
  Administered 2013-05-29: 40 mg via SUBCUTANEOUS
  Filled 2013-05-29 (×2): qty 0.4

## 2013-05-29 NOTE — Progress Notes (Signed)
CSW spoke with patient about dc plans when patient medically ready. Patient has bed at [facility] in [place] when medically ready for dc. CSW will update when new information arises.   Maree Krabbe, MSW, Theresia Majors [phone]

## 2013-05-29 NOTE — Progress Notes (Signed)
CARDIAC REHAB PHASE I   PRE:  Rate/Rhythm: 98 paced  BP:  Supine:   Sitting:   Standing: 94/64   SaO2: 95%RA  MODE:  Ambulation: 550 ft   POST:  Rate/Rhythm: 104 paced  BP:  Supine:   Sitting: 100/70  Standing:    SaO2: 95%RA 1055-1112 Pt walked 550 ft with steady gait. Tolerated well. Pt felt as if she over did it yesterday and did not want to walk at far today. To recliner after walk. Knows to rest up about 3 hours between walks.   Luetta Nutting, RN BSN  05/29/2013 11:18 AM

## 2013-05-29 NOTE — Progress Notes (Signed)
Patient: Mary [name] Date of Encounter: 05/29/2013, 11:38 AM Admit date: 05/23/2013     Subjective  Ms. Moxon denies any new complaints this AM.   Objective  Physical Exam: Vitals: BP 102/65  Pulse 82  Temp(Src) 98.2 F (36.8 C) (Oral)  Resp 18  Ht 5\' 2"  (1.575 m)  Wt 109 lb 9.1 oz (49.7 kg)  BMI 20.04 kg/m2  SpO2 95%  LMP 02/04/2013 General: Well developed, well appearing, in no acute distress. Neck: Supple. JVD not elevated. Lungs: Clear bilaterally to auscultation without wheezes, rales, or rhonchi. Breathing is unlabored. Heart: Regular. S1 S2 with crisp mechanical valve sounds. No murmur, rub or gallop.  Abdomen: Soft, non-distended. Extremities: No clubbing or cyanosis. No edema.  Distal pedal pulses are 2+ and equal bilaterally. Neuro: Alert and oriented X 3. Moves all extremities spontaneously. No focal deficits.  Intake/Output:  Intake/Output Summary (Last 24 hours) at 05/29/13 1138 Last data filed at 05/28/13 1837  Gross per 24 hour  Intake    840 ml  Output      0 ml  Net    840 ml    Inpatient Medications:  . aspirin EC  325 mg Oral Daily  . bisacodyl  10 mg Oral Daily   Or  . bisacodyl  10 mg Rectal Daily  . clotrimazole  1 Applicatorful Vaginal QHS  . docusate sodium  200 mg Oral Daily  . enoxaparin (LOVENOX) injection  40 mg Subcutaneous Q24H  . furosemide  40 mg Oral BID  . pantoprazole  40 mg Oral Daily  . potassium chloride  20 mEq Oral BID  . sodium chloride  10-40 mL Intracatheter Q12H  . sodium chloride  3 mL Intravenous Q12H  . sodium chloride  3 mL Intravenous Q12H  . warfarin  2.5 mg Oral q1800  . Warfarin - Physician Dosing Inpatient   Does not apply q1800   . sodium chloride      Labs:  Recent Labs  05/27/13 0435  NA 138  K 3.7  CL 99  CO2 31  GLUCOSE 79  BUN 16  CREATININE 0.96  CALCIUM 9.1    Recent Labs  05/27/13 0435  WBC 11.9*  HGB 10.3*  HCT 31.4*  MCV 82.0  PLT 195    Recent Labs   05/29/13 0550  INR 1.69*    Radiology/Studies: Dg Chest 2 View  05/27/2013   *RADIOLOGY REPORT*  Clinical Data: Follow up atelectasis.  CHEST - 2 VIEW  Comparison: 05/26/2013  Findings: The left-sided central venous line is again noted.  There is been interval removal of right-sided thoracostomy catheters.  No pneumothorax is noted.  Bibasilar atelectatic changes are seen although the overall degree of aeration has improved significantly from prior exam.  IMPRESSION: Significant improvement in aeration.  No pneumothorax following chest tube removal is noted.   Original Report Authenticated By: Alcide Clever, M.D.    Telemetry: A sensed V paced currently; with temp pacing off, remains in CHB    Assessment and Plan  1. Complete heart block s/p MV replacement and TV repair  She continues to have persistent AV block post op day 6. She will likely require PPM implant. Will schedule PPM implant for tomorrow.  Dr. Johney Frame to see Signed, EDMISTEN, BROOKE PA-C  I have seen, examined the patient, and reviewed the above assessment and plan.  Changes to above are made where necessary.  Today, she continues to have complete heart block.  Epicardial wire V  threshold is 1.5 mA.  I anticipate that she will indeed require PPM implant.  We may tentatively place her on the schedule for tomorrow.  Co Sign: Hillis Range, MD 05/29/2013 11:54 AM

## 2013-05-29 NOTE — Progress Notes (Signed)
Pt ambulated with standby assistance approx. 700 feet. Pt tolerated well and experienced no SOB. Pt in chair and resting with call bell in reach.

## 2013-05-29 NOTE — Progress Notes (Addendum)
[address]       [place] [address]             [phone]      6 Days Post-Op  Procedure(s) (LRB): MINIMALLY INVASIVE TRICUSPID VALVE REPAIR (Right) INTRAOPERATIVE TRANSESOPHAGEAL ECHOCARDIOGRAM (N/A) HERNIA REPAIR FEMORAL (Right) MITRAL VALVE (MV) REPLACEMENT (N/A) Subjective: Feels tired  Objective  Telemetry vpaced  Temp:  [98.2 F (36.8 C)-98.7 F (37.1 C)] 98.2 F (36.8 C) (09/03 0430) Pulse Rate:  [78-98] 82 (09/03 0430) Resp:  [18] 18 (09/03 0430) BP: (102-115)/(58-69) 102/65 mmHg (09/03 0430) SpO2:  [95 %-100 %] 95 % (09/03 0430) Weight:  [109 lb 9.1 oz (49.7 kg)] 109 lb 9.1 oz (49.7 kg) (09/03 0430)   Intake/Output Summary (Last 24 hours) at 05/29/13 0808 Last data filed at 05/28/13 1837  Gross per 24 hour  Intake   1080 ml  Output      0 ml  Net   1080 ml       General appearance: alert, cooperative and no distress Heart: regular rate and rhythm and crisp valve click  Lungs: mildly dim in right base Abdomen: benign exam Extremities: no LE edema Wound: incisions healing well  Lab Results:  Recent Labs  05/27/13 0435  NA 138  K 3.7  CL 99  CO2 31  GLUCOSE 79  BUN 16  CREATININE 0.96  CALCIUM 9.1   No results found for this basename: AST, ALT, ALKPHOS, BILITOT, PROT, ALBUMIN,  in the last 72 hours No results found for this basename: LIPASE, AMYLASE,  in the last 72 hours  Recent Labs  05/27/13 0435  WBC 11.9*  HGB 10.3*  HCT 31.4*  MCV 82.0  PLT 195   No results found for this basename: CKTOTAL, CKMB, TROPONINI,  in the last 72 hours No components found with this basename: POCBNP,  No results found for this basename: DDIMER,  in the last 72 hours No results found for this basename: HGBA1C,  in the last 72 hours No results found for this basename: CHOL, HDL, LDLCALC, TRIG, CHOLHDL,  in the last 72 hours No results found for this basename: TSH, T4TOTAL, FREET3, T3FREE, THYROIDAB,  in the last 72 hours No  results found for this basename: VITAMINB12, FOLATE, FERRITIN, TIBC, IRON, RETICCTPCT,  in the last 72 hours  Medications: Scheduled . aspirin EC  325 mg Oral Daily  . bisacodyl  10 mg Oral Daily   Or  . bisacodyl  10 mg Rectal Daily  . clotrimazole  1 Applicatorful Vaginal QHS  . docusate sodium  200 mg Oral Daily  . furosemide  40 mg Oral BID  . pantoprazole  40 mg Oral Daily  . potassium chloride  20 mEq Oral BID  . sodium chloride  10-40 mL Intracatheter Q12H  . sodium chloride  3 mL Intravenous Q12H  . sodium chloride  3 mL Intravenous Q12H  . warfarin  2.5 mg Oral q1800  . Warfarin - Physician Dosing Inpatient   Does not apply q1800     Radiology/Studies:  No results found.  INR:1.69 Will add last result for INR, ABG once components are confirmed Will add last 4 CBG results once components are confirmed  Assessment/Plan: S/P Procedure(s) (LRB): MINIMALLY INVASIVE TRICUSPID VALVE REPAIR (Right) INTRAOPERATIVE TRANSESOPHAGEAL ECHOCARDIOGRAM (N/A) HERNIA REPAIR FEMORAL (Right) MITRAL VALVE (MV) REPLACEMENT (N/A)  1 EP following for CHB, poss pacer 2 may need to increase coumadin dose but will keep low as will likely  need PPM 3 cont pulm toilet/rehab    LOS: 6 days    GOLD,WAYNE E 9/3/20148:08 AM  I have seen and examined the patient and agree with the assessment and plan as outlined.  Honor Frison H 05/29/2013 8:50 AM

## 2013-05-30 ENCOUNTER — Encounter (HOSPITAL_COMMUNITY)
Admission: RE | Disposition: A | Discharge status: Home / Self Care | Payer: Self-pay | Source: Ambulatory Visit | Attending: Thoracic Surgery (Cardiothoracic Vascular Surgery)

## 2013-05-30 LAB — PROTIME-INR
INR: 1.87 — ABNORMAL HIGH (ref 0.00–1.49)
Prothrombin Time: 21 seconds — ABNORMAL HIGH (ref 11.6–15.2)

## 2013-05-30 SURGERY — PERMANENT PACEMAKER INSERTION
Anesthesia: LOCAL

## 2013-05-30 MED ORDER — ENOXAPARIN SODIUM 40 MG/0.4ML ~~LOC~~ SOLN
40.0000 mg | SUBCUTANEOUS | Status: DC
  Administered 2013-05-30 – 2013-05-31 (×2): 40 mg via SUBCUTANEOUS
  Filled 2013-05-30 (×2): qty 0.4

## 2013-05-30 NOTE — Progress Notes (Signed)
CARDIAC REHAB PHASE I   PRE:  Rate/Rhythm: 22 [place] with POD    BP: sitting 86/50 in bed    SaO2: 97 RA  MODE:  Ambulation: to door   POST:  Rate/Rhythm: 86 [place]    BP: sitting 96/60, standing 70/40     SaO2:   Pt walked to door but became dizzy. Returned to edge of bed. Upon sitting BP was higher but when she stood there was obvious decrease to 70/40. Became dizzy again. Left pt on EOB in RN's care. Pt to call for help with mobility.  [phone]   Elissa Lovett [place] CES, ACSM 05/30/2013 12:13 PM

## 2013-05-30 NOTE — Progress Notes (Addendum)
I was notified by Cardiac rehab that pt's BP dropped from 80's/50's to 70's/40's while ambulating to the door and back. Pt felt very dizzy. Notified PA of this- PA came to bed side to check on pt. Pt is to rest in bed for now. Will continue to monitor. Obtained manual BP while pt sitting and was 90/60 in left arm.

## 2013-05-30 NOTE — Progress Notes (Addendum)
[address]       [place] [address]             [phone]      7 Days Post-Op  Procedure(s) (LRB): MINIMALLY INVASIVE TRICUSPID VALVE REPAIR (Right) INTRAOPERATIVE TRANSESOPHAGEAL ECHOCARDIOGRAM (N/A) HERNIA REPAIR FEMORAL (Right) MITRAL VALVE (MV) REPLACEMENT (N/A) Subjective: Feels ok, but did get SOB this am. Improved with O2  Objective  Telemetry CHB under pacer  Temp:  [98.3 F (36.8 C)-98.6 F (37 C)] 98.6 F (37 C) (09/04 0459) Pulse Rate:  [62-106] 62 (09/04 0459) Resp:  [18] 18 (09/04 0459) BP: (121-125)/(61-74) 121/74 mmHg (09/04 0459) SpO2:  [98 %-99 %] 99 % (09/04 0459) Weight:  [108 lb 8 oz (49.215 kg)] 108 lb 8 oz (49.215 kg) (09/04 0459)   Intake/Output Summary (Last 24 hours) at 05/30/13 0806 Last data filed at 05/30/13 0503  Gross per 24 hour  Intake    240 ml  Output   1800 ml  Net  -1560 ml       General appearance: alert, cooperative and no distress Heart: regular rate and rhythm and loud mechanical valve click Lungs: no rales, mildly dim in bases Abdomen: benign Extremities: no edema Incis: healing well  Lab Results: No results found for this basename: NA, K, CL, CO2, GLUCOSE, BUN, CREATININE, CALCIUM, MG, PHOS,  in the last 72 hours No results found for this basename: AST, ALT, ALKPHOS, BILITOT, PROT, ALBUMIN,  in the last 72 hours No results found for this basename: LIPASE, AMYLASE,  in the last 72 hours No results found for this basename: WBC, NEUTROABS, HGB, HCT, MCV, PLT,  in the last 72 hours No results found for this basename: CKTOTAL, CKMB, TROPONINI,  in the last 72 hours No components found with this basename: POCBNP,  No results found for this basename: DDIMER,  in the last 72 hours No results found for this basename: HGBA1C,  in the last 72 hours No results found for this basename: CHOL, HDL, LDLCALC, TRIG, CHOLHDL,  in the last 72 hours No results found for this basename: TSH, T4TOTAL, FREET3,  T3FREE, THYROIDAB,  in the last 72 hours No results found for this basename: VITAMINB12, FOLATE, FERRITIN, TIBC, IRON, RETICCTPCT,  in the last 72 hours  Medications: Scheduled . aspirin EC  325 mg Oral Daily  . bisacodyl  10 mg Oral Daily   Or  . bisacodyl  10 mg Rectal Daily  .  ceFAZolin (ANCEF) IV  2 g Intravenous On Call  . clotrimazole  1 Applicatorful Vaginal QHS  . docusate sodium  200 mg Oral Daily  . enoxaparin (LOVENOX) injection  40 mg Subcutaneous Q24H  . furosemide  40 mg Oral BID  . gentamicin irrigation  80 mg Irrigation On Call  . pantoprazole  40 mg Oral Daily  . potassium chloride  20 mEq Oral BID  . sodium chloride  10-40 mL Intracatheter Q12H  . sodium chloride  3 mL Intravenous Q12H  . sodium chloride  3 mL Intravenous Q12H  . warfarin  2.5 mg Oral q1800  . Warfarin - Physician Dosing Inpatient   Does not apply q1800     Radiology/Studies:  No results found.  INR:1.87 Will add last result for INR, ABG once components are confirmed Will add last 4 CBG results once components are confirmed  Assessment/Plan: S/P Procedure(s) (LRB): MINIMALLY INVASIVE TRICUSPID VALVE REPAIR (Right) INTRAOPERATIVE TRANSESOPHAGEAL ECHOCARDIOGRAM (N/A) HERNIA REPAIR FEMORAL (Right) MITRAL VALVE (MV) REPLACEMENT (  N/A)  1 EP conts to monitor for poss PPM 2 INR 1.87 , cont current dose for now. No other new labs 3 push pulm toilet/rehab as able      LOS: 7 days    GOLD,WAYNE E 9/4/20148:06 AM  I have seen and examined the patient and agree with the assessment and plan as outlined.  Rhythm looks to be recovering.  Still in heart block but high junctional escape rhythm 80's.  Will leave on VVI backup at 60 bpm  Homero Hyson H 05/30/2013 9:49 AM

## 2013-05-30 NOTE — Progress Notes (Signed)
CSW spoke with patient who states she is still willing to go to [facility]. CSW spoke to admissions at [facility] who stated that bed is still available for patient and they do take admissions over the weekend if CSW lets them know Friday, in advance. CSW will continue to follow for dc planning.  Maree Krabbe, MSW, Theresia Majors [phone]

## 2013-05-30 NOTE — Progress Notes (Signed)
Pt ambulated with standby assistance approx. 350 feet. Pt's BP prior to walk 109/56. Pt was not dizzy or SOB during walk. Pt tolerated very well. Pt's BP while ambulating 103/60. Pt placed back in bed with call bell in reach.

## 2013-05-30 NOTE — Progress Notes (Signed)
SUBJECTIVE: The patient is doing well today.  At this time, she denies chest pain, shortness of breath, or any new concerns.  She did have a period of tachy palpitations earlier this morning with activity with HR up to 110.    CURRENT MEDICATIONS: . aspirin EC  325 mg Oral Daily  . bisacodyl  10 mg Oral Daily   Or  . bisacodyl  10 mg Rectal Daily  .  ceFAZolin (ANCEF) IV  2 g Intravenous On Call  . clotrimazole  1 Applicatorful Vaginal QHS  . docusate sodium  200 mg Oral Daily  . enoxaparin (LOVENOX) injection  40 mg Subcutaneous Q24H  . furosemide  40 mg Oral BID  . gentamicin irrigation  80 mg Irrigation On Call  . pantoprazole  40 mg Oral Daily  . potassium chloride  20 mEq Oral BID  . sodium chloride  10-40 mL Intracatheter Q12H  . sodium chloride  3 mL Intravenous Q12H  . sodium chloride  3 mL Intravenous Q12H  . warfarin  2.5 mg Oral q1800  . Warfarin - Physician Dosing Inpatient   Does not apply q1800   . sodium chloride    . sodium chloride 50 mL/hr at 05/30/13 0602  . sodium chloride      OBJECTIVE: Physical Exam: Filed Vitals:   05/28/13 2112 05/29/13 0430 05/29/13 2148 05/30/13 0459  BP: 115/69 102/65 125/61 121/74  Pulse: 98 82 106 62  Temp: 98.7 F (37.1 C) 98.2 F (36.8 C) 98.3 F (36.8 C) 98.6 F (37 C)  TempSrc: Oral Oral Oral Oral  Resp: 18 18 18 18   Height:      Weight:  109 lb 9.1 oz (49.7 kg)  108 lb 8 oz (49.215 kg)  SpO2: 100% 95% 98% 99%    Intake/Output Summary (Last 24 hours) at 05/30/13 4098 Last data filed at 05/30/13 0503  Gross per 24 hour  Intake    240 ml  Output   1800 ml  Net  -1560 ml    Telemetry reveals P synchronous pacing, underlying temp pacer Mobitz I heart block  GEN- The patient is alert and oriented x 3 today.   Head- normocephalic, atraumatic Eyes-  Sclera clear, conjunctiva pink Ears- hearing intact Oropharynx- clear Neck- supple,  Lungs- Clear to ausculation bilaterally, normal work of breathing Heart-  Regular rate and rhythm (paced) GI- soft, NT, ND, + BS Extremities- no clubbing, cyanosis, or edema  LABS: INR pending this morning  RADIOLOGY: Dg Chest 2 View 05/27/2013   *RADIOLOGY REPORT*  Clinical Data: Follow up atelectasis.  CHEST - 2 VIEW  Comparison: 05/26/2013  Findings: The left-sided central venous line is again noted.  There is been interval removal of right-sided thoracostomy catheters.  No pneumothorax is noted.  Bibasilar atelectatic changes are seen although the overall degree of aeration has improved significantly from prior exam.  IMPRESSION: Significant improvement in aeration.  No pneumothorax following chest tube removal is noted.   Original Report Authenticated By: Alcide Clever, M.D.   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 Complete heart block s/p MV replacement and TV repair  She continues to have persistent AV block post op day  7.  I am however more optimistic today as she is now in mobitz I second degree AV block.  I think we should continue to watch her for now.  I will therefore not proceed with PPM today.  We will make this decision daily.  Possible pacemaker tomorrow vs additional waiting over the weekend depending on her conduction tomorrow AM.  Epicardial threshold 1.37mA.  Hillis Range, MD

## 2013-05-31 MED ORDER — ENSURE COMPLETE PO LIQD
237.0000 mL | ORAL | Status: DC
  Administered 2013-05-31 – 2013-06-03 (×4): 237 mL via ORAL

## 2013-05-31 MED ORDER — WARFARIN SODIUM 5 MG PO TABS
5.0000 mg | ORAL_TABLET | Freq: Every day | ORAL | Status: DC
  Administered 2013-05-31: 5 mg via ORAL
  Filled 2013-05-31 (×2): qty 1

## 2013-05-31 NOTE — Progress Notes (Signed)
      [address]       [name] [address]             [phone]     CARDIOTHORACIC SURGERY PROGRESS NOTE  8 Days Post-Op  S/P Procedure(s) (LRB): MINIMALLY INVASIVE TRICUSPID VALVE REPAIR (Right) INTRAOPERATIVE TRANSESOPHAGEAL ECHOCARDIOGRAM (N/A) HERNIA REPAIR FEMORAL (Right) MITRAL VALVE (MV) REPLACEMENT (N/A)  Subjective: Feels well.  Looks good.  Objective: Vital signs in last 24 hours: Temp:  [97.7 F (36.5 C)-98.6 F (37 C)] 97.9 F (36.6 C) (09/05 0556) Pulse Rate:  [74-104] 104 (09/05 0828) Cardiac Rhythm:  [-] Heart block (09/05 0836) Resp:  [18] 18 (09/05 0556) BP: (90-120)/(55-68) 108/66 mmHg (09/05 0828) SpO2:  [96 %-99 %] 96 % (09/05 0556) Weight:  [48.5 kg (106 lb 14.8 oz)] 48.5 kg (106 lb 14.8 oz) (09/05 0556)  Physical Exam:  Rhythm:   Sinus w/ 1st and 2nd degree AV block  Breath sounds: clear  Heart sounds:  RRR  Incisions:  Clean and dry  Abdomen:  Soft, non-distended, non-tender  Extremities:  Warm, well-perfused    Intake/Output from previous day: 09/04 0701 - 09/05 0700 In: 366 [P.O.:360; I.V.:6] Out: 950 [Stool:950] Intake/Output this shift:    Lab Results: No results found for this basename: WBC, HGB, HCT, PLT,  in the last 72 hours BMET: No results found for this basename: NA, K, CL, CO2, GLUCOSE, BUN, CREATININE, CALCIUM,  in the last 72 hours  CBG (last 3)  No results found for this basename: GLUCAP,  in the last 72 hours PT/INR:   Recent Labs  05/31/13 0520  LABPROT 21.4*  INR 1.92*    CXR:  N/A  Assessment/Plan: S/P Procedure(s) (LRB): MINIMALLY INVASIVE TRICUSPID VALVE REPAIR (Right) INTRAOPERATIVE TRANSESOPHAGEAL ECHOCARDIOGRAM (N/A) HERNIA REPAIR FEMORAL (Right) MITRAL VALVE (MV) REPLACEMENT (N/A)  AV block appears to be resolving Otherwise doing quite well Will increase coumadin dose Leave pacing wires 1 more day Anticipate d/c to SNF on Monday if rhythm continues to improve  OWEN,CLARENCE  H 05/31/2013 10:15 AM

## 2013-05-31 NOTE — Progress Notes (Signed)
SUBJECTIVE: The patient is doing well today.  At this time, she denies chest pain, shortness of breath, or any new concerns.  Temp pacer turned down to VVI 60 yesterday.  Pt with predominately SR with 1st degree AV block, occasional Mobitz I.  Ambulating without difficulty.     CURRENT MEDICATIONS: . aspirin EC  325 mg Oral Daily  . bisacodyl  10 mg Oral Daily   Or  . bisacodyl  10 mg Rectal Daily  . clotrimazole  1 Applicatorful Vaginal QHS  . docusate sodium  200 mg Oral Daily  . enoxaparin (LOVENOX) injection  40 mg Subcutaneous Q24H  . furosemide  40 mg Oral BID  . pantoprazole  40 mg Oral Daily  . potassium chloride  20 mEq Oral BID  . sodium chloride  10-40 mL Intracatheter Q12H  . sodium chloride  3 mL Intravenous Q12H  . sodium chloride  3 mL Intravenous Q12H  . warfarin  2.5 mg Oral q1800  . Warfarin - Physician Dosing Inpatient   Does not apply q1800   . sodium chloride    . sodium chloride Stopped (05/30/13 0753)  . sodium chloride      OBJECTIVE: Physical Exam: Filed Vitals:   05/30/13 1215 05/30/13 1300 05/30/13 2153 05/31/13 0556  BP: 90/60 120/55 90/67 106/68  Pulse:  74 102 81  Temp:  98.6 F (37 C) 97.7 F (36.5 C) 97.9 F (36.6 C)  TempSrc:  Oral Oral Oral  Resp:  18 18 18   Height:      Weight:    106 lb 14.8 oz (48.5 kg)  SpO2:  99% 98% 96%    Intake/Output Summary (Last 24 hours) at 05/31/13 1610 Last data filed at 05/30/13 1724  Gross per 24 hour  Intake    366 ml  Output    950 ml  Net   -584 ml    Telemetry reveals sinus rhythm with 1st degree AV block, occasional Mobitz I, rare V pacing  GEN- The patient is alert and oriented x 3 today.   Head- normocephalic, atraumatic Eyes-  Sclera clear, conjunctiva pink Ears- hearing intact Oropharynx- clear Neck- supple,  Lungs- Clear to ausculation bilaterally, normal work of breathing Heart- Regular rate and rhythm  GI- soft, NT, ND, + BS Extremities- no clubbing, cyanosis, or  edema  LABS: 1.92  RADIOLOGY: Dg Chest 2 View 05/27/2013   *RADIOLOGY REPORT*  Clinical Data: Follow up atelectasis.  CHEST - 2 VIEW  Comparison: 05/26/2013  Findings: The left-sided central venous line is again noted.  There is been interval removal of right-sided thoracostomy catheters.  No pneumothorax is noted.  Bibasilar atelectatic changes are seen although the overall degree of aeration has improved significantly from prior exam.  IMPRESSION: Significant improvement in aeration.  No pneumothorax following chest tube removal is noted.   Original Report Authenticated By: Alcide Clever, M.D.   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 Complete heart block s/p MV replacement and TV repair  She continues to have persistent AV block post op day 8.  Her conduction continues to improve. We will try to avoid PPM.  Will reassess on Monday.  Please call with  questions over the weekend.

## 2013-05-31 NOTE — Progress Notes (Signed)
CARDIAC REHAB PHASE I   PRE:  Rate/Rhythm: up walking    BP: sitting 121/69    SaO2: 93 RA  MODE:  Ambulation: 800 ft   POST:  Rate/Rhythm: 113 [place]    BP: sitting 127/70     SaO2: 96 RA  Tolerated very well. Feels good today. Has been up independently but did want to hold to me to walk longer distance. Encouraged increase in distance. VSS. [phone]   Elissa Lovett [place] CES, ACSM 05/31/2013 1:55 PM

## 2013-05-31 NOTE — Progress Notes (Signed)
Patient ambulated 300 ft on room air tolerated well. 

## 2013-05-31 NOTE — Discharge Summary (Signed)
Physician Discharge Summary       [address] [place]       [name] [address]             [phone]    Patient ID: Mary [name] MRN: [id number] DOB/AGE: 51-Nov-1962 51 y.o.  Admit date: 05/23/2013 Discharge date: 06/04/2013  Admission Diagnoses: 1. Severe mitral regurgitation 2.Mild mitral stenosis 3.Moderate-to-severe tricuspid regurgitation 4.History of hypertension 5.History of GERD 6.History of SVT 7.History of acute diastolic heart failure 8.History of CAP 9.History of blood transfusion  Discharge Diagnoses:  1. Severe mitral regurgitation 2.Mild mitral stenosis 3.Moderate-to-severe tricuspid regurgitation 4.History of hypertension 5.History of GERD 6.History of SVT 7.History of acute diastolic heart failure 8.History of CAP 9.History of blood transfusion 10.Incarcerated right femoral hernia  Procedure (s):  Minimally-Invasive Mitral Valve Replacement  Sorin Carbomedics Optiform mechanical prosthesis (size 33mm, catalog #[id number], serial #[id number])  Minimally-Invasive Tricuspid Valve Repair  Complex valvuloplasty including Cormatrix ECM patch augmentation of anterior and lateral leaflets  Edwards mc3 ring annuloplasty (size 26mm, model #4900, serial #[id number]) by Dr. Cornelius Moras on 05/23/2013.  History of Presenting Illness: This is a 52 year old African American female who returns for followup of severe symptomatic mitral regurgitation. She was originally seen in consultation by Dr. Laneta Simmers and myself during her recent hospitalization for acute exacerbation of congestive heart failure. She was admitted to the hospital 03/15/2013 with resting shortness of breath, orthopnea, lower extremity edema, fever and chills. She was initially treated with empiric antibiotics for presumed community acquired pneumonia. She was noted to have a prominent systolic murmur on physical exam and an echocardiogram demonstrated severe mitral regurgitation. There was suggestion of the  presence of a vegetation on the anterior leaflet of the mitral valve by transthoracic echocardiogram, and subsequently patient was treated for the presumptive diagnosis of bacterial endocarditis. Blood cultures have always remained negative, although apparently none were obtained prior to initiation of antibiotic therapy. Transesophageal echocardiogram performed by Dr. 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 There was no clear vegetation identified. Her symptoms of congestive heart failure improved rapidly with diuretic therapy. Left and right heart catheterization was notable for the absence of significant coronary artery disease and confirmed the presence of severe mitral regurgitation. There was moderate pulmonary hypertension. Plans were made at that time to allow the patient to complete her course of empiric antibiotics and check followup with cultures once antibiotic therapy was discontinued. Since hospital discharge the patient has also been seen by Dr. [name] in the dental clinic and undergone dental extraction. She was last seen here in the office 04/22/2013. She completed her course of IV antibiotics and her PICC line was removed. Subsequent followup blood cultures remained no growth. She was scheduled for CT angiogram last week but the test was postponed until today apparently because the CT scanner was broken down. Unfortunately, earlier today her scan was postponed again because they had difficulty obtaining IV access. She is scheduled to go back for her scan first thing tomorrow. Overall, the patient is clinically doing well and she reports no new problems or complaints since her last office visit.  Since hospital discharge the patient reports remaining clinically stable. She still has exertional shortness of breath, particularly when she goes up and down a flight of stairs. She denies resting shortness of breath,  PND, orthopnea, or lower extremity edema. She's not had any fevers or chills or night sweats. She has mild pain in both knees.  Dr. Cornelius Moras discussed with the patient the need for elective mitral and tricuspid valve repair. CT angio was done and results were reviewed by Dr. Cornelius Moras.Potential risks, complications, and benefits of the surgery were discussed with the patient and she agreed to proceed. She underwent a mitral valve replacement, tricuspid valve repair, and repair of right femoral hernia on 05/23/2013.  Brief Hospital Course:  The patient was extubated early the morning of post operative day one.She remained afebrile and hemodynamically stable. She initially was AV paced.Theone Murdoch, a line, and foley were removed early in the post operative course. Chest tubes did remain for a couple of days then were removed.She did have brief a fib in the OR. She was started on IV amiodarone. She was then put on oral Amiodarone. Coumadin was also started and the PT and INR were monitored daily. Her INR was 1.75 on 9/8.She did have ABL anemia. Her last H and H was up to 10.3 and 30.4. Lopressor was started and titrated accordingly. She was volume over loaded and initially diuresed with a Lasix drip. She was weaned off the insulin drip.The patient's HGA1C pre op was 5.4. She then developed bradycardia with complete heart block. Amiodarone and Lopressor were stopped.. The patient was felt surgically stable for transfer from the ICU to PCTU for further convalescence on 05/26/2013. On Q was removed on 9/1 Because she was pacer dependent due to complete heart block, EP (Dr. Johney Frame) was consulted. Eventually, her conduction system did recover.She did have a first degree AV block, with occasional Mobitz I. Per EPS, it was felt best to not do a PPM at this time. She continues to progress with cardiac rehab. She was ambulating on room air. She has been tolerating a diet and has had a bowel movement. Epicardial pacing wires and chest  tube sutures will be removed prior to discharge. Provided the patient remains afebrile, hemodynamically stable, and pending morning round evaluation, she will be surgically stable for discharge on 06/04/2013.   Latest Vital Signs: Blood pressure 98/56, pulse 113, temperature 98 F (36.7 C), temperature source Oral, resp. rate 18, height 5\' 2"  (1.575 m), weight 48 kg (105 lb 13.1 oz), last menstrual period 02/04/2013, SpO2 96.00%.  Physical Exam: Rhythm: Sinus w/ 1st and 2nd degree AV block  Breath sounds: clear  Heart sounds: RRR  Incisions: Clean and dry  Abdomen: Soft, non-distended, non-tender  Extremities: Warm, well-perfused   Discharge Condition:Stable  Recent laboratory studies:  Lab Results  Component Value Date   WBC 11.1* 06/01/2013   HGB 10.3* 06/01/2013   HCT 30.4* 06/01/2013   MCV 79.8 06/01/2013   PLT 632* 06/01/2013   Lab Results  Component Value Date   NA 131* 06/01/2013   K 4.6 06/01/2013   CL 91* 06/01/2013   CO2 30 06/01/2013   CREATININE 1.00 06/01/2013   GLUCOSE 82 06/01/2013      Diagnostic Studies: Dg Chest 2 View  05/27/2013   *RADIOLOGY REPORT*  Clinical Data: Follow up atelectasis.  CHEST - 2 VIEW  Comparison: 05/26/2013  Findings: The left-sided central venous line is again noted.  There is been interval removal of right-sided thoracostomy catheters.  No pneumothorax is noted.  Bibasilar atelectatic changes are seen although the overall degree of aeration has improved significantly from prior exam.  IMPRESSION: Significant improvement in aeration.  No pneumothorax following chest tube removal is noted.   Original Report Authenticated By: Alcide Clever, M.D.    Ir US Guide Vasc Access Right  05/21/2013   *  RADIOLOGY REPORT*  Clinical Data: IV access required for CT angiography prior to the mitral valve replacement surgery.  There has been inability to obtain peripheral IV access for CTA 4 days ago and the patient has been rescheduled for ultrasound guided venous access prior  to the CTA examination.  IR ULTRASOUND GUIDANCE VASC ACCESS RIGHT  Technique: Ultrasound examination of the right upper arm was performed.  The medial aspect of the upper arm was prepped with chlorhexidine.  Local anesthesia was provided with 1% lidocaine. Ultrasound was used to confirm patency of the right brachial vein. Under ultrasound guidance, a 21 gauge needle was advanced into the vein with image documentation performed.  A guidewire was then advanced.  The needle was removed and a 5-French micropuncture dilator was advanced over a wire.  This was secured for IV access. The dilator was aspirated and flushed with saline.  IMPRESSION: Ultrasound guided vascular access performed of the right brachial vein prior to CT angiography due to inability to obtain peripheral IV access.   Original Report Authenticated By: Irish Lack, M.D.   Ct Angio Chest Aorta W/cm &/or Wo/cm  05/21/2013   CLINICAL DATA:  Mitral regurgitation. Preop. Evaluate for aortic disease.  EXAM: CT ANGIOGRAPHY CHEST, ABDOMEN AND PELVIS  TECHNIQUE: Multidetector CT imaging through the chest, abdomen and pelvis was performed using the standard protocol during bolus administration of intravenous contrast. Multiplanar reconstructed images including MIPs were obtained and reviewed to evaluate the vascular anatomy.  CONTRAST:  80mL OMNIPAQUE IOHEXOL 350 MG/ML SOLN  COMPARISON:  None.  FINDINGS: CTA CHEST FINDINGS  Thoracic aorta is normal caliber. No dissection. Ascending thoracic aortic diameter 2.4 cm. Aortic arch diameter approximately 1.7 cm. Proximal and distal descending thoracic aortic diameter 1.9 cm.  Mild cardiomegaly. Dilated left atrium.  Small pericardial effusion.  No pleural effusion. Mild emphysematous changes in the lungs which are otherwise clear. No mediastinal, hilar, or axillary adenopathy. Visualized thyroid and chest wall soft tissues unremarkable.  Review of the MIP images confirms the above findings.  CTA ABDOMEN AND  PELVIS FINDINGS  Aorta is normal caliber. Distal aortic diameter 13 mm. Right common iliac artery 6 mm. Left common iliac artery 6 mm. Right common femoral artery 6 mm. Left common femoral artery 7 mm. No evidence of dissection.  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 The superior mesenteric artery and single renal arteries widely patent. Inferior mesenteric artery widely patent.  Prior cholecystectomy. Liver, spleen, pancreas, adrenals and kidneys are normal. The small amount of free fluid in the pelvis, likely physiologic. Uterus and adnexa grossly unremarkable as is the urinary bladder. Stomach, large and small bowel unremarkable. No free air or adenopathy.  No acute bony abnormality.  Review of the MIP images confirms the above findings.  IMPRESSION: CTA CHEST IMPRESSION  No evidence of aortic aneurysm or dissection. No significant intrinsic disease.  Mild COPD.  CTA ABDOMEN AND PELVIS IMPRESSION  No evidence of aortic aneurysm or dissection. No significant intrinsic disease.  Small amount of free fluid in the pelvis.   Electronically Signed   By: Charlett Nose   On: 05/21/2013 09:11   Ct Angio Abd/pel W/ And/or W/o  05/21/2013   CLINICAL DATA:  Mitral regurgitation. Preop. Evaluate for aortic disease.  EXAM: CT ANGIOGRAPHY CHEST, ABDOMEN AND PELVIS  TECHNIQUE: Multidetector CT imaging through the chest, abdomen and pelvis was performed using the standard protocol during bolus administration of intravenous contrast. Multiplanar reconstructed images including MIPs were obtained and reviewed to evaluate  the vascular anatomy.  CONTRAST:  80mL OMNIPAQUE IOHEXOL 350 MG/ML SOLN  COMPARISON:  None.  FINDINGS: CTA CHEST FINDINGS  Thoracic aorta is normal caliber. No dissection. Ascending thoracic aortic diameter 2.4 cm. Aortic arch diameter approximately 1.7 cm. Proximal and distal descending thoracic aortic diameter 1.9 cm.  Mild cardiomegaly. Dilated left atrium.  Small pericardial effusion.  No  pleural effusion. Mild emphysematous changes in the lungs which are otherwise clear. No mediastinal, hilar, or axillary adenopathy. Visualized thyroid and chest wall soft tissues unremarkable.  Review of the MIP images confirms the above findings.  CTA ABDOMEN AND PELVIS FINDINGS  Aorta is normal caliber. Distal aortic diameter 13 mm. Right common iliac artery 6 mm. Left common iliac artery 6 mm. Right common femoral artery 6 mm. Left common femoral artery 7 mm. No evidence of dissection.  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 The superior mesenteric artery and single renal arteries widely patent. Inferior mesenteric artery widely patent.  Prior cholecystectomy. Liver, spleen, pancreas, adrenals and kidneys are normal. The small amount of free fluid in the pelvis, likely physiologic. Uterus and adnexa grossly unremarkable as is the urinary bladder. Stomach, large and small bowel unremarkable. No free air or adenopathy.  No acute bony abnormality.  Review of the MIP images confirms the above findings.  IMPRESSION: CTA CHEST IMPRESSION  No evidence of aortic aneurysm or dissection. No significant intrinsic disease.  Mild COPD.  CTA ABDOMEN AND PELVIS IMPRESSION  No evidence of aortic aneurysm or dissection. No significant intrinsic disease.  Small amount of free fluid in the pelvis.   Electronically Signed   By: Charlett Nose   On: 05/21/2013 09:11    Future Appointments Provider Department Dept Phone   06/10/2013 1:00 PM [facility] [place] [phone]      Discharge Medications:   Medication List    STOP taking these medications       amiodarone 200 MG tablet  Commonly known as:  PACERONE     chlorhexidine 0.12 % solution  Commonly known as:  PERIDEX     furosemide 40 MG tablet  Commonly known as:  LASIX     ibuprofen 200 MG tablet  Commonly known as:  ADVIL,MOTRIN      TAKE these medications       aspirin 81 MG  chewable tablet  Chew 1 tablet (81 mg total) by mouth daily.     traMADol 50 MG tablet  Commonly known as:  ULTRAM  Take 1-2 tablets (50-100 mg total) by mouth every 4 (four) hours as needed.     warfarin 2.5 MG tablet  Commonly known as:  COUMADIN  Take 2 tablets (5 mg total) by mouth daily at 6 PM.       Follow-up Information   Follow up with Hillis Range, MD. (Call for a follow up for 1-2 weeks)    Specialty:  Cardiology   Contact information:   [address] [place] [address] [phone]       Follow up with Purcell Nails, MD. (PA/LAT CXR to be taken (at [facility] which is in the same building as Dr. Orvan July office) on 06/10/2013 at 12:00 pm;Appointment with Dr. Orvan July physician assistant is on 06/10/2013 at 1:00 pm)    Specialty:  Cardiothoracic Surgery   Contact information:   [address] [employer] [address] [place] [address] [phone]       Follow up with [facility]. (arrange for blood test in 48-72 hours from discharge)    Specialty:  Cardiology   Contact information:   [address], [address] [place] [address] [phone]      Signed: Doree Fudge MPA-C 06/03/2013, 2:25 PM

## 2013-05-31 NOTE — Progress Notes (Signed)
CSW following for SNF when medically ready. CSW has completed FL2 & will continue to follow and assist with return.   Maree Krabbe, MSW, [facility]ors [phone]

## 2013-05-31 NOTE — Progress Notes (Signed)
NUTRITION FOLLOW UP  Intervention:   1. Ensure Complete po daily, each supplement provides 350 kcal and 13 grams of protein.   Nutrition Dx:   Inadequate oral intake related to limited appetite and diet as evidenced by pt report and diet provision.  Goal:   Intake to meet at least 90% estimated nutrition needs. Progressing  Monitor:   PO intake, supplement tolerance, weight trends, labs   Assessment:   S/p: MINIMALLY INVASIVE TRICUSPID VALVE REPAIR (Right)  INTRAOPERATIVE TRANSESOPHAGEAL ECHOCARDIOGRAM (N/A)  HERNIA REPAIR FEMORAL (Right)  MITRAL VALVE (MV) REPLACEMENT (N/A)  Continues with heart block, possible need for PPM placement or d/c on Monday if rhythm continues to improve per notes.  Pt reports her appetite is "ok". Chart review shows 50-75% meal completion. Pt on bedside commode at time of RD visit.   Height: Ht Readings from Last 1 Encounters:  05/26/13 5\' 2"  (1.575 m)    Weight Status:   Wt Readings from Last 1 Encounters:  05/31/13 106 lb 14.8 oz (48.5 kg)  Admission weight 103 lbs, had gone up to 128 lbs, and now trending back down near admission weight. Expect weight changes were related to fluid as pt was on a lasix drip for some time.   Re-estimated needs:  Kcal: 1400 - 1600  Protein: 56 - 66 g  Fluid: 1.4 - 1.6 liters  Skin: chest and groin incision  Diet Order: Cardiac   Intake/Output Summary (Last 24 hours) at 05/31/13 1105 Last data filed at 05/30/13 1724  Gross per 24 hour  Intake    240 ml  Output    350 ml  Net   -110 ml    Last BM: 9/4   Labs:   Recent Labs Lab 05/24/13 1850 05/25/13 0421 05/26/13 0400 05/27/13 0435  NA  --  136 136 138  K  --  4.6 3.9 3.7  CL  --  102 100 99  CO2  --  24 28 31   BUN  --  14 17 16   CREATININE 1.14* 1.17* 1.12* 0.96  CALCIUM  --  8.9 9.1 9.1  MG 2.2  --   --   --   GLUCOSE  --  100* 82 79    CBG (last 3)  No results found for this basename: GLUCAP,  in the last 72 hours  Scheduled  Meds: . bisacodyl  10 mg Oral Daily   Or  . bisacodyl  10 mg Rectal Daily  . clotrimazole  1 Applicatorful Vaginal QHS  . docusate sodium  200 mg Oral Daily  . furosemide  40 mg Oral BID  . pantoprazole  40 mg Oral Daily  . potassium chloride  20 mEq Oral BID  . sodium chloride  10-40 mL Intracatheter Q12H  . warfarin  5 mg Oral q1800  . Warfarin - Physician Dosing Inpatient   Does not apply q1800    Continuous Infusions: none   Clarene Duke RD, LDN Pager [phone] After Hours pager [phone]

## 2013-05-31 NOTE — Progress Notes (Signed)
Pt vomited watery, yellow emesis with bits of egg.  She states that the egg tasted bad to her and caused her to vomit.  She denies nausea after she vomited.  Will monitor.

## 2013-05-31 NOTE — Progress Notes (Signed)
Pt ambulated independently 300 feet at this time; steady gait noted; no needs voiced; pt back to room to chair; will cont. To monitor.

## 2013-06-01 ENCOUNTER — Inpatient Hospital Stay (HOSPITAL_COMMUNITY): Payer: Medicaid Other

## 2013-06-01 LAB — BASIC METABOLIC PANEL
BUN: 8 mg/dL (ref 6–23)
CO2: 30 mEq/L (ref 19–32)
GFR calc non Af Amer: 65 mL/min — ABNORMAL LOW (ref >90)
Glucose, Bld: 82 mg/dL (ref 70–99)
Potassium: 4.6 mEq/L (ref 3.5–5.1)

## 2013-06-01 LAB — CBC
HCT: 30.4 % — ABNORMAL LOW (ref 36.0–46.0)
Hemoglobin: 10.3 g/dL — ABNORMAL LOW (ref 12.0–15.0)
MCH: 27 pg (ref 26.0–34.0)
MCHC: 33.9 g/dL (ref 30.0–36.0)
RBC: 3.81 MIL/uL — ABNORMAL LOW (ref 3.87–5.11)

## 2013-06-01 LAB — PROTIME-INR: INR: 2.26 — ABNORMAL HIGH (ref 0.00–1.49)

## 2013-06-01 MED ORDER — WARFARIN SODIUM 2.5 MG PO TABS
2.5000 mg | ORAL_TABLET | Freq: Every day | ORAL | Status: DC
  Administered 2013-06-01: 2.5 mg via ORAL
  Filled 2013-06-01 (×2): qty 1

## 2013-06-01 NOTE — Progress Notes (Signed)
Pt ambulated in hallway independently 550 ft on room air and tolerated activity well. Will continue to monitor.

## 2013-06-01 NOTE — Progress Notes (Signed)
CARDIAC REHAB PHASE I   PRE:  Rate/Rhythm: 90 AFIB   BP:  Sitting: 105/62     SaO2: 95% RA  MODE:  Ambulation: 850 ft   POST:  Rate/Rhythm: 105 wenckebach   BP:  Sitting: 118/56     SaO2: 95% RA  1:00PM-2:22pm Patient appeared to be in AFIB before walk. Nurse was alerted and gave the okay to ambulate.  Patient was asymptomatic. After walk 12-lead was conducted and patient was out of fib and in wenckebach. Patient still asymptomatic. Patient tolerated walk well at a slow and steady pace. Patient is interested in Cardiac Rehab.  Lindaann Slough [place], [place] 06/01/2013 2:13 PM

## 2013-06-01 NOTE — Progress Notes (Addendum)
[address]       [place] [address]             [phone]      9 Days Post-Op  Procedure(s) (LRB): MINIMALLY INVASIVE TRICUSPID VALVE REPAIR (Right) INTRAOPERATIVE TRANSESOPHAGEAL ECHOCARDIOGRAM (N/A) HERNIA REPAIR FEMORAL (Right) MITRAL VALVE (MV) REPLACEMENT (N/A) Subjective: Feels fine  Objective  Telemetry rate ok, difficult to read baseline, mostly first degree AVB  Temp:  [97.3 F (36.3 C)-98 F (36.7 C)] 98 F (36.7 C) (09/06 0441) Pulse Rate:  [80-104] 86 (09/06 0441) Resp:  [18-19] 19 (09/06 0441) BP: (103-121)/(65-69) 104/65 mmHg (09/06 0441) SpO2:  [95 %-97 %] 95 % (09/06 0441) Weight:  [108 lb 6.4 oz (49.17 kg)] 108 lb 6.4 oz (49.17 kg) (09/06 0441)   Intake/Output Summary (Last 24 hours) at 06/01/13 0808 Last data filed at 05/31/13 1835  Gross per 24 hour  Intake   1200 ml  Output      0 ml  Net   1200 ml       General appearance: alert, cooperative and no distress Heart: regular rate and rhythm Lungs: clear to auscultation bilaterally Abdomen: benign Extremities: no edema Wound: incis without signs of infection  Lab Results:  Recent Labs  06/01/13 0455  NA 131*  K 4.6  CL 91*  CO2 30  GLUCOSE 82  BUN 8  CREATININE 1.00  CALCIUM 9.6   No results found for this basename: AST, ALT, ALKPHOS, BILITOT, PROT, ALBUMIN,  in the last 72 hours No results found for this basename: LIPASE, AMYLASE,  in the last 72 hours  Recent Labs  06/01/13 0455  WBC 11.1*  HGB 10.3*  HCT 30.4*  MCV 79.8  PLT 632*   No results found for this basename: CKTOTAL, CKMB, TROPONINI,  in the last 72 hours No components found with this basename: POCBNP,  No results found for this basename: DDIMER,  in the last 72 hours No results found for this basename: HGBA1C,  in the last 72 hours No results found for this basename: CHOL, HDL, LDLCALC, TRIG, CHOLHDL,  in the last 72 hours No results found for this basename: TSH, T4TOTAL, FREET3,  T3FREE, THYROIDAB,  in the last 72 hours No results found for this basename: VITAMINB12, FOLATE, FERRITIN, TIBC, IRON, RETICCTPCT,  in the last 72 hours  Medications: Scheduled . bisacodyl  10 mg Oral Daily   Or  . bisacodyl  10 mg Rectal Daily  . clotrimazole  1 Applicatorful Vaginal QHS  . docusate sodium  200 mg Oral Daily  . feeding supplement  237 mL Oral Q24H  . furosemide  40 mg Oral BID  . pantoprazole  40 mg Oral Daily  . potassium chloride  20 mEq Oral BID  . sodium chloride  10-40 mL Intracatheter Q12H  . warfarin  5 mg Oral q1800  . Warfarin - Physician Dosing Inpatient   Does not apply q1800     Radiology/Studies:  No results found.  INR:2.26  Will add last result for INR, ABG once components are confirmed Will add last 4 CBG results once components are confirmed  Assessment/Plan: S/P Procedure(s) (LRB): MINIMALLY INVASIVE TRICUSPID VALVE REPAIR (Right) INTRAOPERATIVE TRANSESOPHAGEAL ECHOCARDIOGRAM (N/A) HERNIA REPAIR FEMORAL (Right) MITRAL VALVE (MV) REPLACEMENT (N/A)  1 stable , cont to observe rhythm- hemodynamically stable 2 labs stable 3 poss d/c pacer wires- INR 2.26   LOS: 9 days    GOLD,WAYNE E 9/6/20148:08 AM  HR is in  the 80-90 range but I can't be sure about the rhythm. Will keep pacer wires for now. INR is rising so give 2.5 mg coumadin tonight.

## 2013-06-01 NOTE — Progress Notes (Signed)
Pt ambulated in hallway independently 550 ft on room air. Pt tolerated activity well. Will continue to monitor.

## 2013-06-01 NOTE — Progress Notes (Signed)
Conduction continues to improve Will see as needed over the weekend.  Please call with questions.

## 2013-06-02 LAB — PROTIME-INR: INR: 1.9 — ABNORMAL HIGH (ref 0.00–1.49)

## 2013-06-02 MED ORDER — WARFARIN SODIUM 4 MG PO TABS
4.0000 mg | ORAL_TABLET | Freq: Every day | ORAL | Status: DC
  Administered 2013-06-02: 4 mg via ORAL
  Filled 2013-06-02 (×2): qty 1

## 2013-06-02 NOTE — Progress Notes (Addendum)
[address]       [place] [address]             [phone]      10 Days Post-Op  Procedure(s) (LRB): MINIMALLY INVASIVE TRICUSPID VALVE REPAIR (Right) INTRAOPERATIVE TRANSESOPHAGEAL ECHOCARDIOGRAM (N/A) HERNIA REPAIR FEMORAL (Right) MITRAL VALVE (MV) REPLACEMENT (N/A) Subjective: Feels well  Objective  Telemetry tachy with variable block  Temp:  [97.6 F (36.4 C)-98.4 F (36.9 C)] 98.1 F (36.7 C) (09/07 0433) Pulse Rate:  [80-84] 84 (09/07 0433) Resp:  [16-18] 18 (09/07 0433) BP: (109-111)/(63-74) 109/67 mmHg (09/07 0433) SpO2:  [96 %-100 %] 96 % (09/07 0433) Weight:  [108 lb 3.9 oz (49.1 kg)] 108 lb 3.9 oz (49.1 kg) (09/07 0433)   Intake/Output Summary (Last 24 hours) at 06/02/13 0849 Last data filed at 06/01/13 1300  Gross per 24 hour  Intake    240 ml  Output      0 ml  Net    240 ml       General appearance: alert, cooperative and no distress Heart: regular rate and rhythm and crisp valve click Lungs: clear to auscultation bilaterally Abdomen: benign exam Extremities: no edema Wound: incisions healing well  Lab Results:  Recent Labs  06/01/13 0455  NA 131*  K 4.6  CL 91*  CO2 30  GLUCOSE 82  BUN 8  CREATININE 1.00  CALCIUM 9.6   No results found for this basename: AST, ALT, ALKPHOS, BILITOT, PROT, ALBUMIN,  in the last 72 hours No results found for this basename: LIPASE, AMYLASE,  in the last 72 hours  Recent Labs  06/01/13 0455  WBC 11.1*  HGB 10.3*  HCT 30.4*  MCV 79.8  PLT 632*   No results found for this basename: CKTOTAL, CKMB, TROPONINI,  in the last 72 hours No components found with this basename: POCBNP,  No results found for this basename: DDIMER,  in the last 72 hours No results found for this basename: HGBA1C,  in the last 72 hours No results found for this basename: CHOL, HDL, LDLCALC, TRIG, CHOLHDL,  in the last 72 hours No results found for this basename: TSH, T4TOTAL, FREET3, T3FREE,  THYROIDAB,  in the last 72 hours No results found for this basename: VITAMINB12, FOLATE, FERRITIN, TIBC, IRON, RETICCTPCT,  in the last 72 hours  Medications: Scheduled . bisacodyl  10 mg Oral Daily   Or  . bisacodyl  10 mg Rectal Daily  . clotrimazole  1 Applicatorful Vaginal QHS  . docusate sodium  200 mg Oral Daily  . feeding supplement  237 mL Oral Q24H  . furosemide  40 mg Oral BID  . pantoprazole  40 mg Oral Daily  . potassium chloride  20 mEq Oral BID  . warfarin  2.5 mg Oral q1800  . Warfarin - Physician Dosing Inpatient   Does not apply q1800     Radiology/Studies:  Dg Chest 2 View  06/01/2013   *RADIOLOGY REPORT*  Clinical Data: Evaluate atelectasis, CHF, history of valve repair  CHEST - 2 VIEW  Comparison: 05/27/2013; 05/26/2013  Findings: Grossly unchanged enlarged cardiac silhouette and mediastinal contours post mitral and tricuspid valve replacements. The lungs remain hyperexpanded with flattening about hemidiaphragms. Minimal improved aeration of the bilateral lower lungs with persistent bibasilar linear heterogeneous opacities, left greater than right. No new focal airspace opacities.  There is blunting of left costophrenic angle which may suggest a trace left- sided effusion.  No pneumothorax.  Unchanged  bones.  IMPRESSION:  Minimal improved aeration of the lung bases with persistent bibasilar linear opacities, left greater than right, favored to represent atelectasis.   Original Report Authenticated By: Tacey Ruiz, MD    INR:1.90 Will add last result for INR, ABG once components are confirmed Will add last 4 CBG results once components are confirmed  Assessment/Plan: S/P Procedure(s) (LRB): MINIMALLY INVASIVE TRICUSPID VALVE REPAIR (Right) INTRAOPERATIVE TRANSESOPHAGEAL ECHOCARDIOGRAM (N/A) HERNIA REPAIR FEMORAL (Right) MITRAL VALVE (MV) REPLACEMENT (N/A)  1 doing well with recovery overall 2 variable block, mostly first degree- EP following, EPW's remain in  place 3  conts coumadin 4  SNF short term at d/c   LOS: 10 days    GOLD,WAYNE E 9/7/20148:49 AM   Chart reviewed, patient examined, agree with above. Looks like long 1st degree block with p-wave in T-wave. May be able to remove pacing wires tomorrow. Give 4 mg coumadin today.

## 2013-06-02 NOTE — Progress Notes (Signed)
Pt ambulated in hallway 550 ft independently on room air. Pt tolerated activity well. Will continue to monitor.  

## 2013-06-02 NOTE — Progress Notes (Signed)
On-call chaplain responded to nurse referral. Pt requested prayer and a Bible. Pt was up and moving around, smiling. Pt feels "alone," has limited local support system and 51 year old son. Concerned about father's health in [place]. Pt was emotional. Chaplain provided emotional support, compassionate listening, affirmation of pt's sense of God with her, Bible and prayer.   06/02/13 0900  Clinical Encounter Type  Visited With Patient  Visit Type Initial;Spiritual support;Social support  Referral From Nurse  Spiritual Encounters  Spiritual Needs Literature;Prayer;Emotional  Stress Factors  Patient Stress Factors Family relationships;Health changes;Other (Comment)  Maurene Capes, Chaplain Resident

## 2013-06-03 LAB — PROTIME-INR
INR: 1.75 — ABNORMAL HIGH (ref 0.00–1.49)
Prothrombin Time: 19.9 seconds — ABNORMAL HIGH (ref 11.6–15.2)

## 2013-06-03 MED ORDER — WARFARIN SODIUM 5 MG PO TABS
5.0000 mg | ORAL_TABLET | Freq: Every day | ORAL | Status: DC
  Administered 2013-06-03: 5 mg via ORAL
  Filled 2013-06-03 (×2): qty 1

## 2013-06-03 NOTE — Progress Notes (Signed)
EPW removed per protocol.  Ends intact.  Pt tolerated well.  Pt informed of one hour bedrest per protocol.  Verbalized understanding.  Will continue to monitor pt closely.

## 2013-06-03 NOTE — Progress Notes (Signed)
CSW spoke to patient who states she wants to go home now for dc instead of SNF. CSW signing off. Please re consult if CSW needs arise.  Maree Krabbe, MSW, Theresia Majors [phone]

## 2013-06-03 NOTE — Progress Notes (Signed)
Patient continues to walk around the unit well at a fair, steady pace. She walked approximately 575ft.  She tolerates ambulation well.  Vital signs are stable. She was shown her discharge video. Will continue to monitor.

## 2013-06-03 NOTE — Progress Notes (Signed)
CARDIAC REHAB PHASE I   PRE:  Rate/Rhythm: 109 ST 1HB  BP:  Supine:   Sitting: 86/50  Standing:    SaO2: 94%RA  MODE:  Ambulation: 1930 ft   POST:  Rate/Rhythm: 117 ST 1HB  BP:  Supine:   Sitting: 86/56  Standing:    SaO2: 95%RA 1400-1432 Pt walked 1930 ft with steady gait. Denied dizziness. Tolerated well. Walked independently. Able to walk and talk without SOB.   Luetta Nutting, RN BSN  06/03/2013 2:29 PM

## 2013-06-03 NOTE — Progress Notes (Signed)
   SUBJECTIVE: The patient is doing well today.  At this time, she denies chest pain, shortness of breath, or any new concerns.   Pt with predominately SR with 1st degree AV block, occasional Mobitz I.  Ambulating without difficulty.   Feels strong enough to go home with support.  Does not think she needs rehab.   CURRENT MEDICATIONS: . bisacodyl  10 mg Oral Daily   Or  . bisacodyl  10 mg Rectal Daily  . docusate sodium  200 mg Oral Daily  . feeding supplement  237 mL Oral Q24H  . furosemide  40 mg Oral BID  . pantoprazole  40 mg Oral Daily  . potassium chloride  20 mEq Oral BID  . warfarin  4 mg Oral q1800  . Warfarin - Physician Dosing Inpatient   Does not apply q1800      OBJECTIVE: Physical Exam: Filed Vitals:   06/02/13 0433 06/02/13 1355 06/02/13 2050 06/03/13 0446  BP: 109/67 96/60 105/69 100/60  Pulse: 84 98 104 95  Temp: 98.1 F (36.7 C) 98 F (36.7 C) 98.3 F (36.8 C) 97.7 F (36.5 C)  TempSrc: Oral Oral Oral Oral  Resp: 18 16 18 18   Height:      Weight: 108 lb 3.9 oz (49.1 kg)   105 lb 13.1 oz (48 kg)  SpO2: 96% 97% 96% 96%    Intake/Output Summary (Last 24 hours) at 06/03/13 0630 Last data filed at 06/02/13 2200  Gross per 24 hour  Intake    120 ml  Output      0 ml  Net    120 ml    Telemetry reveals sinus rhythm with 1st degree AV block, occasional Mobitz I  GEN- The patient is alert and oriented x 3 today.   Head- normocephalic, atraumatic Eyes-  Sclera clear, conjunctiva pink Ears- hearing intact Oropharynx- clear Neck- supple,  Lungs- Clear to ausculation bilaterally, normal work of breathing Heart- Regular rate and rhythm  GI- soft, NT, ND, + BS Extremities- no clubbing, cyanosis, or edema  LABS: INR 1.75  RADIOLOGY: Dg Chest 2 View 05/27/2013   *RADIOLOGY REPORT*  Clinical Data: Follow up atelectasis.  CHEST - 2 VIEW  Comparison: 05/26/2013  Findings: The left-sided central venous line is again noted.  There is been interval removal of  right-sided thoracostomy catheters.  No pneumothorax is noted.  Bibasilar atelectatic changes are seen although the overall degree of aeration has improved significantly from prior exam.  IMPRESSION: Significant improvement in aeration.  No pneumothorax following chest tube removal is noted.   Original Report Authenticated By: Alcide Clever, M.D.   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 Complete heart block s/p MV replacement and TV repair  Her conduction appears stable with a first degree AV block.  Given her h/o endocarditis, improvement in conduction, and her strong desire to avoid PPM, I think that we should plan to avoid PPM at this point. No further EP workup planned at this point OK to pull epicardial wires EP will see as needed while here. Please call with questions.

## 2013-06-03 NOTE — Progress Notes (Signed)
      [address]       [name] [address]             [phone]     CARDIOTHORACIC SURGERY PROGRESS NOTE  11 Days Post-Op  S/P Procedure(s) (LRB): MINIMALLY INVASIVE TRICUSPID VALVE REPAIR (Right) INTRAOPERATIVE TRANSESOPHAGEAL ECHOCARDIOGRAM (N/A) HERNIA REPAIR FEMORAL (Right) MITRAL VALVE (MV) REPLACEMENT (N/A)  Subjective: Feels well.  Wants to go home and has arrangements for son and others to stay with her there  Objective: Vital signs in last 24 hours: Temp:  [97.7 F (36.5 C)-98.3 F (36.8 C)] 97.7 F (36.5 C) (09/08 0446) Pulse Rate:  [95-104] 95 (09/08 0446) Cardiac Rhythm:  [-] Heart block;Normal sinus rhythm (09/07 2022) Resp:  [16-18] 18 (09/08 0446) BP: (96-105)/(60-69) 100/60 mmHg (09/08 0446) SpO2:  [96 %-97 %] 96 % (09/08 0446) Weight:  [48 kg (105 lb 13.1 oz)] 48 kg (105 lb 13.1 oz) (09/08 0446)  Physical Exam:  Rhythm:   Sinus w/ long 1st degree AV block and some Mobitz type I 2nd degree  Breath sounds: clear  Heart sounds:  RRR  Incisions:  Clean and dry  Abdomen:  soft  Extremities:  warm   Intake/Output from previous day: 09/07 0701 - 09/08 0700 In: 120 [P.O.:120] Out: -  Intake/Output this shift:    Lab Results:  Recent Labs  06/01/13 0455  WBC 11.1*  HGB 10.3*  HCT 30.4*  PLT 632*   BMET:  Recent Labs  06/01/13 0455  NA 131*  K 4.6  CL 91*  CO2 30  GLUCOSE 82  BUN 8  CREATININE 1.00  CALCIUM 9.6    CBG (last 3)  No results found for this basename: GLUCAP,  in the last 72 hours PT/INR:   Recent Labs  06/03/13 0430  LABPROT 19.9*  INR 1.75*    CXR:  N/A  Assessment/Plan: S/P Procedure(s) (LRB): MINIMALLY INVASIVE TRICUSPID VALVE REPAIR (Right) INTRAOPERATIVE TRANSESOPHAGEAL ECHOCARDIOGRAM (N/A) HERNIA REPAIR FEMORAL (Right) MITRAL VALVE (MV) REPLACEMENT (N/A)  Doing well Rhythm stable D/C pacing wires Tentatively for D/C home tomorrow Increase coumadin dose  OWEN,CLARENCE  H 06/03/2013 9:16 AM

## 2013-06-04 LAB — PROTIME-INR
INR: 1.79 — ABNORMAL HIGH (ref 0.00–1.49)
Prothrombin Time: 20.3 seconds — ABNORMAL HIGH (ref 11.6–15.2)

## 2013-06-04 MED ORDER — WARFARIN SODIUM 2.5 MG PO TABS: 5.0000 mg | ORAL_TABLET | Freq: Every day | ORAL | Status: DC

## 2013-06-04 MED ORDER — TRAMADOL HCL 50 MG PO TABS: 50.0000 mg | ORAL_TABLET | ORAL | Status: DC | PRN

## 2013-06-04 NOTE — Progress Notes (Addendum)
[address]       [place] [address]             [phone]      12 Days Post-Op  Procedure(s) (LRB): MINIMALLY INVASIVE TRICUSPID VALVE REPAIR (Right) INTRAOPERATIVE TRANSESOPHAGEAL ECHOCARDIOGRAM (N/A) HERNIA REPAIR FEMORAL (Right) MITRAL VALVE (MV) REPLACEMENT (N/A) Subjective: conts to feel well  Objective  Telemetry Sinus rhythm/tach 1 degree AVB  Temp:  [97.6 F (36.4 C)-98.2 F (36.8 C)] 98.2 F (36.8 C) (09/09 0500) Pulse Rate:  [101-113] 101 (09/09 0500) Resp:  [18] 18 (09/09 0500) BP: (90-144)/(56-84) 144/84 mmHg (09/09 0500) SpO2:  [96 %-97 %] 97 % (09/09 0500) Weight:  [104 lb 11.5 oz (47.5 kg)] 104 lb 11.5 oz (47.5 kg) (09/09 0500)   Intake/Output Summary (Last 24 hours) at 06/04/13 0726 Last data filed at 06/03/13 1700  Gross per 24 hour  Intake    360 ml  Output      0 ml  Net    360 ml       General appearance: alert, cooperative and no distress Heart: regular rate and rhythm Lungs: clear to auscultation bilaterally Abdomen: benign Extremities: no edema Wound: incis healing well  Lab Results: No results found for this basename: NA, K, CL, CO2, GLUCOSE, BUN, CREATININE, CALCIUM, MG, PHOS,  in the last 72 hours No results found for this basename: AST, ALT, ALKPHOS, BILITOT, PROT, ALBUMIN,  in the last 72 hours No results found for this basename: LIPASE, AMYLASE,  in the last 72 hours No results found for this basename: WBC, NEUTROABS, HGB, HCT, MCV, PLT,  in the last 72 hours No results found for this basename: CKTOTAL, CKMB, TROPONINI,  in the last 72 hours No components found with this basename: POCBNP,  No results found for this basename: DDIMER,  in the last 72 hours No results found for this basename: HGBA1C,  in the last 72 hours No results found for this basename: CHOL, HDL, LDLCALC, TRIG, CHOLHDL,  in the last 72 hours No results found for this basename: TSH, T4TOTAL, FREET3, T3FREE, THYROIDAB,  in the last 72  hours No results found for this basename: VITAMINB12, FOLATE, FERRITIN, TIBC, IRON, RETICCTPCT,  in the last 72 hours  Medications: Scheduled . bisacodyl  10 mg Oral Daily   Or  . bisacodyl  10 mg Rectal Daily  . docusate sodium  200 mg Oral Daily  . feeding supplement  237 mL Oral Q24H  . furosemide  40 mg Oral BID  . pantoprazole  40 mg Oral Daily  . potassium chloride  20 mEq Oral BID  . warfarin  5 mg Oral q1800  . Warfarin - Physician Dosing Inpatient   Does not apply q1800     Radiology/Studies:  No results found.  INR:1.79 Will add last result for INR, ABG once components are confirmed Will add last 4 CBG results once components are confirmed  Assessment/Plan: S/P Procedure(s) (LRB): MINIMALLY INVASIVE TRICUSPID VALVE REPAIR (Right) INTRAOPERATIVE TRANSESOPHAGEAL ECHOCARDIOGRAM (N/A) HERNIA REPAIR FEMORAL (Right) MITRAL VALVE (MV) REPLACEMENT (N/A)  1 conts to do well, EP has signed off and rhythm is stable 2 OK for d/c 3 does not appear to need lasix 4 cont 5 mg coumadin    LOS: 12 days    GOLD,WAYNE E 9/9/20147:26 AM  I have seen and examined the patient and agree with the assessment and plan as outlined.  Looks great.  D/C home today.  OWEN,CLARENCE H 06/04/2013 8:04  AM    

## 2013-06-04 NOTE — Progress Notes (Signed)
06/04/2013 9:00 AM Nursing note CTS d/c per orders. Benzoin and steri strips applied to sites per protocol. Pt. tolerated well. Will continue to monitor. Pawan Knechtel, Blanchard Kelch

## 2013-06-04 NOTE — Progress Notes (Signed)
06/04/2013 11:34 AM Nursing note Discharge AVS form, medications already given today and those due this evening given and explained to patient and family. RX given to patient, follow up appointments, when to call MD and INR Check reviewed with patient. Questions and concerns addressed. D/c iv line. D/c tele. D/c home with family per orders. Pt. States she had already been given the heart failure education packet as well as the coumadin video and booklet.  Nancylee Gaines, Blanchard Kelch

## 2013-06-10 ENCOUNTER — Other Ambulatory Visit: Payer: Self-pay | Admitting: *Deleted

## 2013-06-10 ENCOUNTER — Ambulatory Visit
Admission: RE | Admit: 2013-06-10 | Discharge: 2013-06-10 | Disposition: A | Discharge status: Home / Self Care | Payer: Medicaid Other | Source: Ambulatory Visit | Attending: Thoracic Surgery (Cardiothoracic Vascular Surgery) | Admitting: Thoracic Surgery (Cardiothoracic Vascular Surgery)

## 2013-06-10 ENCOUNTER — Ambulatory Visit (INDEPENDENT_AMBULATORY_CARE_PROVIDER_SITE_OTHER): Payer: Self-pay | Admitting: Physician Assistant

## 2013-06-10 ENCOUNTER — Ambulatory Visit (INDEPENDENT_AMBULATORY_CARE_PROVIDER_SITE_OTHER): Payer: Medicaid Other

## 2013-06-10 VITALS — BP 107/70 | HR 104 | Resp 20 | Ht 62.0 in | Wt 104.0 lb

## 2013-06-10 DIAGNOSIS — Z7901 Long term (current) use of anticoagulants: Secondary | ICD-10-CM

## 2013-06-10 DIAGNOSIS — Z9889 Other specified postprocedural states: Secondary | ICD-10-CM

## 2013-06-10 DIAGNOSIS — I059 Rheumatic mitral valve disease, unspecified: Secondary | ICD-10-CM

## 2013-06-10 DIAGNOSIS — I079 Rheumatic tricuspid valve disease, unspecified: Secondary | ICD-10-CM

## 2013-06-10 DIAGNOSIS — Z954 Presence of other heart-valve replacement: Secondary | ICD-10-CM

## 2013-06-10 DIAGNOSIS — Z952 Presence of prosthetic heart valve: Secondary | ICD-10-CM

## 2013-06-10 DIAGNOSIS — I340 Nonrheumatic mitral (valve) insufficiency: Secondary | ICD-10-CM

## 2013-06-10 DIAGNOSIS — I071 Rheumatic tricuspid insufficiency: Secondary | ICD-10-CM

## 2013-06-10 LAB — POCT INR: INR: 1.6

## 2013-06-10 NOTE — Patient Instructions (Signed)

## 2013-06-10 NOTE — Progress Notes (Addendum)
[address]       [place] [address]             [phone]                  Mary [name] [facility] Medical Record #[id number] Date of Birth: 12-06-1960  Mary Range, MD No PCP Per Patient  Chief Complaint:   PostOp Follow Up Visit   History of Present Illness:     This is a 52 year old African American female who is s/p mitral valve replacement, tricuspid valve repair, and repair of right femoral hernia on 05/23/2013 by Dr. Cornelius Moras. She reports that she continues to feel better and she has less left breast soreness since the surgery. She denies chest pain, shortness of breath, and fever, or chills.   History  Smoking status  . Current Every Day Smoker -- 0.50 packs/day for 36 years  . Types: Cigarettes  . Last Attempt to Quit: 03/15/2013  Smokeless tobacco  . Never Used       Allergies  Allergen Reactions  . Oxycodone Itching    Current Outpatient Prescriptions  Medication Sig Dispense Refill  . aspirin 81 MG chewable tablet Chew 1 tablet (81 mg total) by mouth daily.      . traMADol (ULTRAM) 50 MG tablet Take 1-2 tablets (50-100 mg total) by mouth every 4 (four) hours as needed.  50 tablet  0  . warfarin (COUMADIN) 2.5 MG tablet Take 2 tablets (5 mg total) by mouth daily at 6 PM.  100 tablet  0   No current facility-administered medications for this visit.       Physical Exam: BP 107/70  Pulse 104  Resp 20  Ht 5\' 2"  (1.575 [name])  Wt 47.174 kg (104 lb)  BMI 19.02 kg/m2  SpO2 93%  LMP 01/28/2013  General appearance: alert, cooperative and no distress Neurologic: intact Heart: Lightly tachcardic, S1, S2 normal, no murmur, click, rub or gallop Lungs: clear to auscultation bilaterally Abdomen: soft, non-tender; bowel sounds normal; no masses,  no organomegaly Extremities: no edema, redness or tenderness in the calves or thighs Wounds: Clean, dry, and no signs of infection  Diagnostic Studies & Laboratory data:         Recent  Radiology Findings: Dg Chest 2 View  06/10/2013   CLINICAL DATA:  Post mitral valve surgery. Weakness.  EXAM: CHEST  2 VIEW  COMPARISON:  06/01/2013  FINDINGS: Post valve replacement. Cardiomegaly. Linear areas of scarring or atelectasis in the lung is bilaterally. No effusions. Hyperinflation. No acute bony abnormality. No pneumothorax.  IMPRESSION: Linear areas of scarring or atelectasis bilaterally. Hyperinflation. No acute findings.   Electronically Signed   By: Charlett Nose [name].D.   On: 06/10/2013 12:13      Recent Labs: Lab Results  Component Value Date   WBC 11.1* 06/01/2013   HGB 10.3* 06/01/2013   HCT 30.4* 06/01/2013   PLT 632* 06/01/2013   GLUCOSE 82 06/01/2013   ALT 10 05/20/2013   AST 19 05/20/2013   NA 131* 06/01/2013   K 4.6 06/01/2013   CL 91* 06/01/2013   CREATININE 1.00 06/01/2013   BUN 8 06/01/2013   CO2 30 06/01/2013   INR 1.79* 06/04/2013   HGBA1C 5.4 05/20/2013      Assessment / Plan:    Overall, she is continuing to make steady progress. She has not seen Dr. Johney Frame yet in follow up. I have contacted his office  and she has an appointment to see his mid level practitioner on 06/24/2013 at 8:30 am. In addition, she has not had her PT and INR checked since she left the hospital. She was instructed to go to Dr. Jenel Lucks office TODAY and have it drawn. She was instructed on the importance of having this obtained as adjustments may need to be made to Coumadin.Dr. Jenel Lucks office will follow her PT and INR. There have been no changes to her discharge medications. SHe was instructed she may drive, as long as she is not taking pain pills for pain. She will return to see Dr. Cornelius Moras in 4 weeks.  Mary Flath M PA-C 06/10/2013 1:10 PM

## 2013-06-15 ENCOUNTER — Observation Stay (HOSPITAL_COMMUNITY)
Admission: EM | Admit: 2013-06-15 | Discharge: 2013-06-18 | Disposition: A | Discharge status: Home / Self Care | Payer: Medicaid Other | Attending: Family Medicine | Admitting: Family Medicine

## 2013-06-15 ENCOUNTER — Emergency Department (HOSPITAL_COMMUNITY): Payer: Medicaid Other

## 2013-06-15 ENCOUNTER — Encounter (HOSPITAL_COMMUNITY): Payer: Self-pay | Admitting: Emergency Medicine

## 2013-06-15 DIAGNOSIS — Z7901 Long term (current) use of anticoagulants: Secondary | ICD-10-CM

## 2013-06-15 DIAGNOSIS — Z954 Presence of other heart-valve replacement: Secondary | ICD-10-CM

## 2013-06-15 DIAGNOSIS — Z8679 Personal history of other diseases of the circulatory system: Secondary | ICD-10-CM

## 2013-06-15 DIAGNOSIS — I079 Rheumatic tricuspid valve disease, unspecified: Secondary | ICD-10-CM | POA: Insufficient documentation

## 2013-06-15 DIAGNOSIS — I509 Heart failure, unspecified: Secondary | ICD-10-CM | POA: Insufficient documentation

## 2013-06-15 DIAGNOSIS — R55 Syncope and collapse: Principal | ICD-10-CM

## 2013-06-15 DIAGNOSIS — I440 Atrioventricular block, first degree: Secondary | ICD-10-CM

## 2013-06-15 DIAGNOSIS — Z9889 Other specified postprocedural states: Secondary | ICD-10-CM

## 2013-06-15 DIAGNOSIS — D473 Essential (hemorrhagic) thrombocythemia: Secondary | ICD-10-CM | POA: Insufficient documentation

## 2013-06-15 DIAGNOSIS — I059 Rheumatic mitral valve disease, unspecified: Secondary | ICD-10-CM

## 2013-06-15 DIAGNOSIS — R5383 Other fatigue: Secondary | ICD-10-CM | POA: Insufficient documentation

## 2013-06-15 DIAGNOSIS — Z952 Presence of prosthetic heart valve: Secondary | ICD-10-CM

## 2013-06-15 DIAGNOSIS — R5381 Other malaise: Secondary | ICD-10-CM | POA: Insufficient documentation

## 2013-06-15 DIAGNOSIS — D649 Anemia, unspecified: Secondary | ICD-10-CM

## 2013-06-15 DIAGNOSIS — D75839 Thrombocytosis, unspecified: Secondary | ICD-10-CM

## 2013-06-15 DIAGNOSIS — I10 Essential (primary) hypertension: Secondary | ICD-10-CM | POA: Insufficient documentation

## 2013-06-15 DIAGNOSIS — R42 Dizziness and giddiness: Secondary | ICD-10-CM

## 2013-06-15 DIAGNOSIS — R509 Fever, unspecified: Secondary | ICD-10-CM | POA: Insufficient documentation

## 2013-06-15 DIAGNOSIS — I5030 Unspecified diastolic (congestive) heart failure: Secondary | ICD-10-CM

## 2013-06-15 DIAGNOSIS — R531 Weakness: Secondary | ICD-10-CM

## 2013-06-15 LAB — CBC WITH DIFFERENTIAL/PLATELET
Basophils Absolute: 0 10*3/uL (ref 0.0–0.1)
Basophils Relative: 0 % (ref 0–1)
Hemoglobin: 11.4 g/dL — ABNORMAL LOW (ref 12.0–15.0)
Lymphocytes Relative: 15 % (ref 12–46)
MCHC: 35 g/dL (ref 30.0–36.0)
Monocytes Relative: 7 % (ref 3–12)
Neutro Abs: 6.7 10*3/uL (ref 1.7–7.7)
Neutrophils Relative %: 76 % (ref 43–77)
WBC: 8.8 10*3/uL (ref 4.0–10.5)

## 2013-06-15 LAB — COMPREHENSIVE METABOLIC PANEL
AST: 22 U/L (ref 0–37)
Albumin: 3.7 g/dL (ref 3.5–5.2)
Alkaline Phosphatase: 87 U/L (ref 39–117)
BUN: 8 mg/dL (ref 6–23)
CO2: 23 mEq/L (ref 19–32)
Chloride: 101 mEq/L (ref 96–112)
Potassium: 5 mEq/L (ref 3.5–5.1)
Total Bilirubin: 0.3 mg/dL (ref 0.3–1.2)

## 2013-06-15 LAB — POCT I-STAT TROPONIN I

## 2013-06-15 LAB — PROTIME-INR: Prothrombin Time: 33.4 seconds — ABNORMAL HIGH (ref 11.6–15.2)

## 2013-06-15 LAB — APTT: aPTT: 49 seconds — ABNORMAL HIGH (ref 24–37)

## 2013-06-15 LAB — PROCALCITONIN: Procalcitonin: <0.1 ng/mL

## 2013-06-15 MED ORDER — WARFARIN SODIUM 5 MG PO TABS
5.0000 mg | ORAL_TABLET | Freq: Once | ORAL | Status: AC
  Administered 2013-06-16: 5 mg via ORAL
  Filled 2013-06-15: qty 1

## 2013-06-15 MED ORDER — ASPIRIN 81 MG PO CHEW
81.0000 mg | CHEWABLE_TABLET | Freq: Every day | ORAL | Status: DC
  Administered 2013-06-16 – 2013-06-18 (×3): 81 mg via ORAL
  Filled 2013-06-15 (×3): qty 1

## 2013-06-15 MED ORDER — SODIUM CHLORIDE 0.9 % IV BOLUS (SEPSIS): 1000.0000 mL | Freq: Once | INTRAVENOUS | Status: DC

## 2013-06-15 MED ORDER — WARFARIN - PHARMACIST DOSING INPATIENT: Freq: Every day | Status: DC

## 2013-06-15 MED ORDER — SODIUM CHLORIDE 0.9 % IV SOLN
INTRAVENOUS | Status: AC
  Administered 2013-06-16: 01:00:00 via INTRAVENOUS

## 2013-06-15 MED ORDER — ONDANSETRON HCL 4 MG PO TABS: 4.0000 mg | ORAL_TABLET | Freq: Four times a day (QID) | ORAL | Status: DC | PRN

## 2013-06-15 MED ORDER — ONDANSETRON HCL 4 MG/2ML IJ SOLN: 4.0000 mg | Freq: Four times a day (QID) | INTRAMUSCULAR | Status: DC | PRN

## 2013-06-15 MED ORDER — SODIUM CHLORIDE 0.9 % IJ SOLN
3.0000 mL | Freq: Two times a day (BID) | INTRAMUSCULAR | Status: DC
  Administered 2013-06-16 – 2013-06-17 (×4): 3 mL via INTRAVENOUS

## 2013-06-15 NOTE — ED Notes (Signed)
Pt from home by EMS for eval of dizziness and weakness x1 day.  Bicuspid and mitral valve replacement on 8/28. Was doing fine post surgery until yesterday.  N/v intermittently, yellowish color vomit.  No pain. No loc. bp 84/50 initially then 122/83, tachy 120 initially then 104, 98% RR 22-24. Lungs clear, diffuse generalized abd pain on palpation only. CBG 91. 1 degree av block. No st elevation. 20 L AC.  Coumadin and ASA are pt's only medications.

## 2013-06-15 NOTE — ED Provider Notes (Signed)
CSN: [id number]     Arrival date & time 06/15/13  1247 History   First MD Initiated Contact with Patient 06/15/13 1316     Chief Complaint  Patient presents with  . Dizziness   (Consider location/radiation/quality/duration/timing/severity/associated sxs/prior Treatment) The history is provided by the patient. No language interpreter was used.  Mary [name] is a 52 year old female with past medical history of heart murmur, asthma, anemia, hypertension, SVT, mitral and tricuspid valve regurgitation-mitral valve and tricuspid valve replacement performed on 05/23/2013-patient currently on Coumadin 2.5 mg, presenting to emergency department with fever was not feeling herself for the past couple of days. Patient reported that she was instructed to take her temperature and her weight every day, reported that she had a fever yesterday with a MAXIMUM TEMPERATURE of 100F. Patient reports that when she woke up this morning showed 100F. Patient reported that she's been feeling lightheaded, reported that when she woke up this morning she was sweating and this is been feeling "yucky." Patient reported that she's been having diarrhea, reported that she had approximately 3-4 episodes of diarrhea yesterday. Reported that she had one episode of emesis yesterday. Patient reports she's been feeling extremely weak, reported that when sure not to open up the door frame since today she felt she was not going to make it. Patient reported that she woke up this morning with abdominal pain localized to the lower right quadrant, reported that as a 3/10, hurts when palpated. Denied blood in the stools, mucus in the stools, melena, chest pain, urinary issues, and dizziness, neck pain, back pain, visual distortions. PCP none  Patient was admitted to the hospital on 05/20/2013 for pneumonia, while in the hospital patient was diagnosed with CHF. Patient was discharged on 06/04/2013.  Past Medical History  Diagnosis Date  .  Heart murmur   . CAP (community acquired pneumonia) 03/15/2013    "maybe; that's what they are thinking today" (03/15/2013)  . Asthma     "as a baby"   . Orthopnoea 03/15/2013    "in the last 2 days" (03/15/2013)  . Anemia   . History of blood transfusion     "14 w/1st pregnancy; 2 w/last C-section" (03/15/2013)  . GERD (gastroesophageal reflux disease)   . Hypertension     no pcp   will go to [facility]  . Severe mitral regurgitation 04/22/2013  . SVT (supraventricular tachycardia) 03/16/2013  . Acute diastolic heart failure 03/15/2013  . 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 Tricuspid regurgitation   . 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    33mm Sorin Carbomedics mechanical prosthesis via right mini thoracotomy approach  . 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 Complete heart block 05/28/2013    Post-op   Past Surgical History  Procedure Laterality Date  . Appendectomy  ~1978  . Cesarean section  1983; 1999  . Cholecystectomy  2000's  . Tubal ligation  1999  . Tee without cardioversion N/A 03/18/2013    Procedure: TRANSESOPHAGEAL ECHOCARDIOGRAM (TEE);  Surgeon: Laurey Morale, MD;  Location: [facility] ENDOSCOPY;  Service: Cardiovascular;  Laterality: N/A;  . Cardiac catheterization    . Multiple extractions with alveoloplasty N/A 04/04/2013    Procedure: Extraction of tooth #'s 1,8,9,13,14,15,23,24,25,26 with alveoloplasty and gross debridement of remaining teeth;  Surgeon: Charlynne Pander, DDS;  Location: [facility] OR;  Service:  Oral Surgery;  Laterality: N/A;  . Minimally invasive tricuspid valve repair Right 05/23/2013    Procedure: MINIMALLY INVASIVE TRICUSPID VALVE REPAIR;  Surgeon: Purcell Nails, MD;  Location: [facility] OR;  Service: Open Heart Surgery;  Laterality: Right;  .  Intraoperative transesophageal echocardiogram N/A 05/23/2013    Procedure: INTRAOPERATIVE TRANSESOPHAGEAL ECHOCARDIOGRAM;  Surgeon: Purcell Nails, MD;  Location: [facility] OR;  Service: Open Heart Surgery;  Laterality: N/A;  . Femoral hernia repair Right 05/23/2013    Procedure: HERNIA REPAIR FEMORAL;  Surgeon: Purcell Nails, MD;  Location: [facility] OR;  Service: Open Heart Surgery;  Laterality: Right;  . Mitral valve replacement N/A 05/23/2013    Procedure: MITRAL VALVE (MV) REPLACEMENT;  Surgeon: Purcell Nails, MD;  Location: [facility] OR;  Service: Open Heart Surgery;  Laterality: N/A;   No family history on file. History  Substance Use Topics  . Smoking status: Current Every Day Smoker -- 0.50 packs/day for 36 years    Types: Cigarettes    Last Attempt to Quit: 03/15/2013  . Smokeless tobacco: Never Used  . Alcohol Use: 3.6 oz/week    6 Glasses of wine per week     Comment: 03/15/2013 "bottle of wine/wk"   OB History   Grav Para Term Preterm Abortions TAB SAB Ect Mult Living                 Review of Systems  Constitutional: Positive for fever. Negative for chills.  HENT: Negative for sore throat and trouble swallowing.   Eyes: Negative for visual disturbance.  Respiratory: Negative for shortness of breath.   Cardiovascular: Positive for chest pain.  Gastrointestinal: Positive for abdominal pain. Negative for nausea and vomiting.  Genitourinary: Negative for decreased urine volume and difficulty urinating.  Neurological: Positive for light-headedness. Negative for dizziness and weakness.  All other systems reviewed and are negative.    Allergies  Oxycodone  Home Medications   No current outpatient prescriptions on file. BP 123/70  Pulse 96  Temp(Src) 98.5 F (36.9 C) (Oral)  Resp 18  SpO2 100%  LMP 01/28/2013 Physical Exam  Nursing note and vitals reviewed. Constitutional: She is oriented to person, place, and time. She appears well-developed and well-nourished. No distress.   HENT:  Head: Normocephalic and atraumatic.  Mouth/Throat: Oropharynx is clear and moist. No oropharyngeal exudate.  Eyes: Conjunctivae and EOM are normal. Pupils are equal, round, and reactive to light. Right eye exhibits no discharge. Left eye exhibits no discharge.  Neck: Normal range of motion. Neck supple.  Cardiovascular: Regular rhythm and normal heart sounds.  Exam reveals no friction rub.   No murmur heard. Pulses:      Radial pulses are 2+ on the right side, and 2+ on the left side.       Dorsalis pedis pulses are 2+ on the right side, and 2+ on the left side.  Mild tachycardia identified Negative swelling and erythema noted to the ankle and legs bilaterally Negative pitting edema identified  Pulmonary/Chest: Effort normal and breath sounds normal. No respiratory distress. She has no wheezes. She has no rales.  Abdominal: Soft. Bowel sounds are normal. She exhibits no distension. There is no rebound and no guarding.  Left lower quadrant discomfort upon palpation  Musculoskeletal: Normal range of motion.  Lymphadenopathy:    She has no cervical adenopathy.  Neurological: She is alert and oriented to person, place, and time. She exhibits normal muscle tone. Coordination normal.  Skin: Skin is warm and dry. No rash  noted. She is not diaphoretic. No erythema.  Psychiatric: She has a normal mood and affect. Her behavior is normal. Thought content normal.    ED Course  Procedures (including critical care time)  5:04 PM Discussed case with Dr. Michail Jewels with family medicine, Family medicine teaching services, to come down and admit patient.   5:15 PM Spoke with patient regarding admission - patient understood and agreed to plan.   Labs Review Labs Reviewed  CBC WITH DIFFERENTIAL - Abnormal; Notable for the following:    Hemoglobin 11.4 (*)    HCT 32.6 (*)    RDW 17.7 (*)    Platelets 593 (*)    All other components within normal limits  COMPREHENSIVE METABOLIC PANEL - Abnormal;  Notable for the following:    GFR calc non Af Amer 65 (*)    GFR calc Af Amer 75 (*)    All other components within normal limits  PRO B NATRIURETIC PEPTIDE - Abnormal; Notable for the following:    Pro B Natriuretic peptide (BNP) 908.3 (*)    All other components within normal limits  CULTURE, BLOOD (ROUTINE X 2)  CULTURE, BLOOD (ROUTINE X 2)  LIPASE, BLOOD  LACTIC ACID, PLASMA  URINALYSIS, ROUTINE W REFLEX MICROSCOPIC  PROCALCITONIN  POCT I-STAT TROPONIN I   Imaging Review Dg Chest 2 View  06/15/2013   CLINICAL DATA:  Dizziness and cough  EXAM: CHEST  2 VIEW  COMPARISON:  June 10, 2013  FINDINGS: The lungs are clear. Heart size and pulmonary vascularity are normal. No adenopathy. There are mitral and tricuspid valve replacements. No bony lesions.  IMPRESSION: Prosthetic mitral and tricuspid valves. No edema or consolidation.   Electronically Signed   By: Bretta Bang   On: 06/15/2013 15:14    MDM   1. Near syncope   2. Dizziness   3. S/P mitral valve replacement   4. S/P tricuspid valve replacement     Patient presenting to the ED with fever and near-syncopal episode. Patient reported that she noticed she was feeling warm yesterday, reported that her Tmax was 101 degrees Fahrenheit. Reported that when she got up today she felt dizzy, reported that she almost felt like she was going to faint, but reported that she did not. Stated that she just has not felt like herself lately. Patient recently s/p mitral and tricuspid valve replacement on 05/23/2013.  Alert and oriented. Lungs clear to auscultation bilaterally. Mild tachycardia noted upon auscultation. Pulses palpable and strong, distal and proximal. Negative nystagmus. Cranial nerves grossly intact. Strength intact. Negative neurological deficits noted.  EKG noted sinus tachycardia with first degree block. First set of troponins negative elevation. CBC noted decrease in Hgb, 11.4, and Hct, 32.6. CMP negative findings. Lipase  negative elevation. Lactic acid negative elevation. Urinalysis pending. BNP mildly elevated - has improved when compared to 04/15/2013 where BNP was 5910. Chest xray negative findings - negative findings for pneumonia.  Concern regarding patient being tachycardic and febrile - blood cultures and procalcitonin ordered. Concern for near syncope with recent mitral and tricuspid valve replacement, patient currently on coumadin. Discussed case with Dr. Genene Churn who recommended patient to admitted to the hospital. Discussed case with Dr. Michail Jewels - Family Medicine teaching services - patient to be admitted for near syncope with recent valve replacement, as well as fever with unknown cause.    Raymon Mutton, PA-C 06/15/13 1926  Raymon Mutton, PA-C 06/15/13 1948

## 2013-06-15 NOTE — Progress Notes (Signed)
ANTICOAGULATION CONSULT NOTE - Initial Consult  Pharmacy Consult for Coumadin Indication: h/o MVR  Allergies  Allergen Reactions  . Oxycodone Itching    Patient Measurements:   Vital Signs: Temp: 99.3 F (37.4 C) (09/20 2020) Temp src: Oral (09/20 1834) BP: 135/78 mmHg (09/20 2020) Pulse Rate: 90 (09/20 2020)  Labs:  Recent Labs  06/15/13 1430 06/15/13 2050  HGB 11.4*  --   HCT 32.6*  --   PLT 593*  --   APTT  --  49*  LABPROT  --  33.4*  INR  --  3.44*  CREATININE 1.00  --     The CrCl is unknown because both a height and weight (above a minimum accepted value) are required for this calculation.   Medical History: Past Medical History  Diagnosis Date  . Heart murmur   . CAP (community acquired pneumonia) 03/15/2013    "maybe; that's what they are thinking today" (03/15/2013)  . Asthma     "as a baby"   . Orthopnoea 03/15/2013    "in the last 2 days" (03/15/2013)  . Anemia   . History of blood transfusion     "14 w/1st pregnancy; 2 w/last C-section" (03/15/2013)  . GERD (gastroesophageal reflux disease)   . Hypertension     no pcp   will go to [facility]  . Severe mitral regurgitation 04/22/2013  . SVT (supraventricular tachycardia) 03/16/2013  . Acute diastolic heart failure 03/15/2013  . 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 Tricuspid regurgitation   . 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    33mm Sorin Carbomedics mechanical prosthesis via right mini thoracotomy approach  . 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 Complete heart block 05/28/2013    Post-op    Medications:  Prescriptions prior to admission  Medication Sig Dispense Refill  . aspirin 81 MG chewable tablet Chew 1 tablet (81 mg total) by mouth daily.      [name] warfarin (COUMADIN) 2.5 MG  tablet Take 5 mg by mouth daily. Take three (3) tablets 7.5 mg total on Monday, Wednesday, and Fridays at 6 PM. Two (2) tablets 5 mg on other days.        Assessment: 52 yo female admitted with dizziness, recent MVR, to continue Coumadin    Goal of Therapy:  INR 2.5-3.5 Monitor platelets by anticoagulation protocol: Yes   Plan:  Coumadin 5 mg tonight as per home regimen Daily INR  Mary [name], Gary Fleet 06/15/2013,11:27 PM

## 2013-06-15 NOTE — H&P (Signed)
Family Medicine Teaching [facility] Admission History and Physical Service Pager: [phone]  Patient name: Mary [name] Medical record number: [id number] Date of birth: [birth year] Age: 52 y.o. Gender: female  Primary Care Provider: No PCP Per Patient Consultants: [facility] Code Status: Full  Chief Complaint: Presyncope   Assessment and Plan: Mary [name] is a 52 y.o. female presenting with presyncope. 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   # Presyncope - DDx includes dehydration (orthostatics negative after 1L bolus in ED), thyroid dysfunction, anemia (hgb stable @11 .4), vasovagal  and psychogenic; Less likely infectious (Tmax 100 WBC wnl, CXR no consolidation, procalcitonin and lactic acid wnl) but considering endocarditis given recent cardiac instrumentation, neurological (No CN deficits or unilateral weakness), electrolyte abnormality/uremia (BMET wnl). TEE during surgery: Systolic function was normal. EF 60% Wall motion was normal - ProBNP >900 but decreased from previous 5900 (7/21 prior to recent cardiac surgery)  - Daily wts and Strict I&O - ED ordered UA and blood culture - will follow-up  - Check UA,TSH, UDS - Cylce trop's & repeat EKG  - Repeat BMP & CBC in am - PT/OT to evaluate with weight loss since surgery, likely deconditioning - Consider echocardiogram if poor urine output or signs of fluid overload, though currently no signs of this other than reported PND/orthopnea that could just be due to MSK healing after surgery  # Fevers - Reported fever at home w/ Tmax 100. Afebrile on admission. CXR, CBC, lipase, procalcitonin, and lactic acid WNL.  - F/u UA, blood culture  - Consider ECHO; will discuss with Cardiothoracic surgeon  # Recent Cardiac Surgery - mitral valve replacement, tricuspid valve repair, and repair of right femoral  hernia 05/23/13 on coumadin - seen by Dr. Joycelyn Man 9/15 with no issues  - INR goal: 2.5-3.5 with a mechanical valve - Coumadin per pharmacy - ASA 81mg  - Will Notify Dr. Joycelyn Man ([facility])  FEN/GI:   Diet: Heart Healthy NS 100 cc Prophylaxis: On Warfarin  Disposition: Admit to telemetry under observation, attending Dr. Randolm Idol; D/c home pending PT/OT evaluation  History of Present Illness: Mary [name] is a 52 y.o. female with recent mitral valve replacement and tricuspid valve repair surgery on 05/23/13 who presents with weakness and lightheadedness. PMH: Rheumatic mitral stenosis with regurgitation s/p replacement, HTN, anemia. She reports feeling feverish (max T 100 at home) and having 3-4 loose stools a day w/o blood for 2 days, and a questionable episode of vomiting "that produced some phlegm." Today while walking she began to feel dizzy and weak and had to "slide down the wall." She denies LOC or falling. She has been eating and drinking normally, but not a "big eater normally." She reports drinking 3-4 bottles a day, but has noticed decreased UOP. Since her surgery she has remained in bed a lot and has noticed decreased wt from 102 to 97 lbs. Additional she reports 2-3 pillow orthopnea, dry cough, and new PND since surgery. She denies chest pain, LE swelling, recent medication changes, or burning with urination. She also denies sick contacts or h/o IV drug use.  ED Course: 1L bolus   Review Of Systems: Per HPI with the following additions:  Otherwise 12 point review of systems was performed and was unremarkable.  Patient Active Problem List   Diagnosis Date Noted  . Heart valve replaced by other means 06/10/2013  . Long term (current) use  of anticoagulants 06/10/2013  . Complete heart block 05/28/2013  . S/P minimally-invasive mitral valve replacement with mechanical valve and tricuspid valve repair 05/23/2013  . S/P minimally-invasive tricuspid valve repair 05/23/2013   . Femoral hernia 05/23/2013  . Severe mitral regurgitation 04/22/2013  . Mitral valve disorders 04/22/2013  . Tricuspid regurgitation 04/22/2013  . 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 SVT (supraventricular tachycardia) 03/16/2013   Past Medical History: Past Medical History  Diagnosis Date  . Heart murmur   . CAP (community acquired pneumonia) 03/15/2013    "maybe; that's what they are thinking today" (03/15/2013)  . Asthma     "as a baby"   . Orthopnoea 03/15/2013    "in the last 2 days" (03/15/2013)  . Anemia   . History of blood transfusion     "14 w/1st pregnancy; 2 w/last C-section" (03/15/2013)  . GERD (gastroesophageal reflux disease)   . Hypertension     no pcp   will go to [facility]  . Severe mitral regurgitation 04/22/2013  . SVT (supraventricular tachycardia) 03/16/2013  . Acute diastolic heart failure 03/15/2013  . 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 Tricuspid regurgitation   . 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    33mm Sorin Carbomedics mechanical prosthesis via right mini thoracotomy approach  . 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 Complete heart block 05/28/2013    Post-op   Past Surgical History: Past Surgical History  Procedure Laterality Date  . Appendectomy  ~1978  . Cesarean section  1983; 1999  . Cholecystectomy  2000's  . Tubal ligation  1999  . Tee without cardioversion N/A 03/18/2013    Procedure: TRANSESOPHAGEAL ECHOCARDIOGRAM (TEE);  Surgeon: Laurey Morale, MD;  Location: [facility] ENDOSCOPY;  Service: Cardiovascular;  Laterality: N/A;  . Cardiac catheterization    . Multiple extractions with alveoloplasty N/A 04/04/2013    Procedure: Extraction of tooth #'s 1,8,9,13,14,15,23,24,25,26 with alveoloplasty and gross  debridement of remaining teeth;  Surgeon: Charlynne Pander, DDS;  Location: [facility] OR;  Service: Oral Surgery;  Laterality: N/A;  . Minimally invasive tricuspid valve repair Right 05/23/2013    Procedure: MINIMALLY INVASIVE TRICUSPID VALVE REPAIR;  Surgeon: Purcell Nails, MD;  Location: [facility] OR;  Service: Open Heart Surgery;  Laterality: Right;  . Intraoperative transesophageal echocardiogram N/A 05/23/2013    Procedure: INTRAOPERATIVE TRANSESOPHAGEAL ECHOCARDIOGRAM;  Surgeon: Purcell Nails, MD;  Location: [facility] OR;  Service: Open Heart Surgery;  Laterality: N/A;  . Femoral hernia repair Right 05/23/2013    Procedure: HERNIA REPAIR FEMORAL;  Surgeon: Purcell Nails, MD;  Location: [facility] OR;  Service: Open Heart Surgery;  Laterality: Right;  . Mitral valve replacement N/A 05/23/2013    Procedure: MITRAL VALVE (MV) REPLACEMENT;  Surgeon: Purcell Nails, MD;  Location: [facility] OR;  Service: Open Heart Surgery;  Laterality: N/A;   Social History: History  Substance Use Topics  . Smoking status: Current Every Day Smoker -- 0.50 packs/day for 36 years    Types: Cigarettes    Last Attempt to Quit: 03/15/2013  . Smokeless tobacco: Never Used  . Alcohol Use: 3.6 oz/week    6 Glasses of wine per week     Comment: 03/15/2013 "bottle of wine/wk"   Additional social history: Recently quit smoking prior to cardiac surgery. One big glass of wine per day, but  none since surgery. Her 52 y.o lives with her. Denies h/o IVDU, reports remote  h/o marijuana use. Please also refer to relevant sections of EMR.  Family History: History reviewed. No pertinent family history. Allergies and Medications: Allergies  Allergen Reactions  . Oxycodone Itching   No current facility-administered medications on file prior to encounter.   Current Outpatient Prescriptions on File Prior to Encounter  Medication Sig Dispense Refill  . aspirin 81 MG chewable tablet Chew 1 tablet (81 mg total) by mouth daily.       Objective: BP 135/78   Pulse 90  Temp(Src) 99.3 F (37.4 C) (Oral)  Resp 17  SpO2 100%  LMP 01/28/2013 Exam: Gen: Thin frail female in NAD Neuro: A&Ox4; Short & Long term memory intact; CN 2-12 intact; Gross Sensory & Motor intact Head: Normocephalic/Atraumatic Eyes:Sclera white; Conjunctiva pink; PERRLA; EOMI Throat: Oral mucosa pink and moist, Pharynx w/o exudates, very poor dentition CV: RRR, Third heart sound (sounds like fixed split S1); No JVD or elevated JVP; Pulses +2 b/l; CR < 3 secs Lungs: CTAB; decreased breath sounds b/l GI: No skin changes; BS +; No tenderness or masses Lower Ext: No skin changes; No edema; distal pulses intact; Calves nontender  Labs and Imaging: Results for orders placed during the hospital encounter of 06/15/13 (from the past 24 hour(s))  CBC WITH DIFFERENTIAL     Status: Abnormal   Collection Time    06/15/13  2:30 PM      Result Value Range   WBC 8.8  4.0 - 10.5 K/uL   RBC 4.15  3.87 - 5.11 MIL/uL   Hemoglobin 11.4 (*) 12.0 - 15.0 g/dL   HCT 16.1 (*) 09.6 - 04.5 %   MCV 78.6  78.0 - 100.0 fL   MCH 27.5  26.0 - 34.0 pg   MCHC 35.0  30.0 - 36.0 g/dL   RDW 40.9 (*) 81.1 - 91.4 %   Platelets 593 (*) 150 - 400 K/uL   Neutrophils Relative % 76  43 - 77 %   Neutro Abs 6.7  1.7 - 7.7 K/uL   Lymphocytes Relative 15  12 - 46 %   Lymphs Abs 1.3  0.7 - 4.0 K/uL   Monocytes Relative 7  3 - 12 %   Monocytes Absolute 0.6  0.1 - 1.0 K/uL   Eosinophils Relative 2  0 - 5 %   Eosinophils Absolute 0.2  0.0 - 0.7 K/uL   Basophils Relative 0  0 - 1 %   Basophils Absolute 0.0  0.0 - 0.1 K/uL  COMPREHENSIVE METABOLIC PANEL     Status: Abnormal   Collection Time    06/15/13  2:30 PM      Result Value Range   Sodium 138  135 - 145 mEq/L   Potassium 5.0  3.5 - 5.1 mEq/L   Chloride 101  96 - 112 mEq/L   CO2 23  19 - 32 mEq/L   Glucose, Bld 77  70 - 99 mg/dL   BUN 8  6 - 23 mg/dL   Creatinine, Ser 7.82  0.50 - 1.10 mg/dL   Calcium 9.4  8.4 - 95.6 mg/dL   Total Protein 7.8  6.0  - 8.3 g/dL   Albumin 3.7  3.5 - 5.2 g/dL   AST 22  0 - 37 U/L   ALT 10  0 - 35 U/L   Alkaline Phosphatase 87  39 - 117 U/L   Total Bilirubin 0.3  0.3 - 1.2  mg/dL   GFR calc non Af Amer 65 (*) >90 mL/min   GFR calc Af Amer 75 (*) >90 mL/min  LIPASE, BLOOD     Status: None   Collection Time    06/15/13  2:30 PM      Result Value Range   Lipase 34  11 - 59 U/L  LACTIC ACID, PLASMA     Status: None   Collection Time    06/15/13  2:30 PM      Result Value Range   Lactic Acid, Venous 1.1  0.5 - 2.2 mmol/L  PRO B NATRIURETIC PEPTIDE     Status: Abnormal   Collection Time    06/15/13  2:30 PM      Result Value Range   Pro B Natriuretic peptide (BNP) 908.3 (*) 0 - 125 pg/mL  POCT I-STAT TROPONIN I     Status: None   Collection Time    06/15/13  4:20 PM      Result Value Range   Troponin i, poc 0.00  0.00 - 0.08 ng/mL   Comment 3           PROCALCITONIN     Status: None   Collection Time    06/15/13  5:05 PM      Result Value Range   Procalcitonin <0.10    APTT     Status: Abnormal   Collection Time    06/15/13  8:50 PM      Result Value Range   aPTT 49 (*) 24 - 37 seconds  PROTIME-INR     Status: Abnormal   Collection Time    06/15/13  8:50 PM      Result Value Range   Prothrombin Time 33.4 (*) 11.6 - 15.2 seconds   INR 3.44 (*) 0.00 - 1.49  TROPONIN I     Status: None   Collection Time    06/16/13  1:00 AM      Result Value Range   Troponin I <0.30  <0.30 ng/mL   CXR IMPRESSION: Prosthetic mitral and tricuspid valves. No edema or consolidation.  Wenda Low, MD 06/15/2013, 10:36 PM PGY-1, [facility] FPTS Intern pager: [phone], text pages welcome   I have seen and examined patient with PGY-1 and agree with assessment and plan, with my additions in pink. Leona Singleton, MD 06/16/2013 12:00 PM PGY-2

## 2013-06-16 ENCOUNTER — Encounter (HOSPITAL_COMMUNITY): Payer: Self-pay | Admitting: Cardiology

## 2013-06-16 DIAGNOSIS — D473 Essential (hemorrhagic) thrombocythemia: Secondary | ICD-10-CM

## 2013-06-16 DIAGNOSIS — R42 Dizziness and giddiness: Secondary | ICD-10-CM

## 2013-06-16 DIAGNOSIS — Z9889 Other specified postprocedural states: Secondary | ICD-10-CM

## 2013-06-16 DIAGNOSIS — Z8679 Personal history of other diseases of the circulatory system: Secondary | ICD-10-CM

## 2013-06-16 DIAGNOSIS — D75839 Thrombocytosis, unspecified: Secondary | ICD-10-CM

## 2013-06-16 DIAGNOSIS — I5030 Unspecified diastolic (congestive) heart failure: Secondary | ICD-10-CM | POA: Diagnosis present

## 2013-06-16 DIAGNOSIS — Z7901 Long term (current) use of anticoagulants: Secondary | ICD-10-CM

## 2013-06-16 DIAGNOSIS — D649 Anemia, unspecified: Secondary | ICD-10-CM

## 2013-06-16 DIAGNOSIS — R531 Weakness: Secondary | ICD-10-CM | POA: Diagnosis present

## 2013-06-16 DIAGNOSIS — R55 Syncope and collapse: Secondary | ICD-10-CM

## 2013-06-16 LAB — CBC
HCT: 31.7 % — ABNORMAL LOW (ref 36.0–46.0)
Hemoglobin: 9.9 g/dL — ABNORMAL LOW (ref 12.0–15.0)
Hemoglobin: 10.7 g/dL — ABNORMAL LOW (ref 12.0–15.0)
MCH: 26.5 pg (ref 26.0–34.0)
MCHC: 33.4 g/dL (ref 30.0–36.0)
Platelets: 505 10*3/uL — ABNORMAL HIGH (ref 150–400)
Platelets: 498 10*3/uL — ABNORMAL HIGH (ref 150–400)
RBC: 3.74 MIL/uL — ABNORMAL LOW (ref 3.87–5.11)
RBC: 4 MIL/uL (ref 3.87–5.11)
RDW: 17.8 % — ABNORMAL HIGH (ref 11.5–15.5)
WBC: 9.3 10*3/uL (ref 4.0–10.5)

## 2013-06-16 LAB — URINALYSIS, ROUTINE W REFLEX MICROSCOPIC
Bilirubin Urine: NEGATIVE
Ketones, ur: NEGATIVE mg/dL
Leukocytes, UA: NEGATIVE
Nitrite: NEGATIVE
Nitrite: NEGATIVE
Protein, ur: NEGATIVE mg/dL
Specific Gravity, Urine: 1.028 (ref 1.005–1.030)
Urobilinogen, UA: 0.2 mg/dL (ref 0.0–1.0)
pH: 7 (ref 5.0–8.0)
pH: 7.5 (ref 5.0–8.0)

## 2013-06-16 LAB — BASIC METABOLIC PANEL
CO2: 21 mEq/L (ref 19–32)
Calcium: 9.2 mg/dL (ref 8.4–10.5)
GFR calc non Af Amer: 68 mL/min — ABNORMAL LOW (ref >90)
Glucose, Bld: 92 mg/dL (ref 70–99)
Potassium: 3.7 mEq/L (ref 3.5–5.1)
Sodium: 135 mEq/L (ref 135–145)

## 2013-06-16 LAB — TSH: TSH: 0.79 u[IU]/mL (ref 0.350–4.500)

## 2013-06-16 LAB — URINE MICROSCOPIC-ADD ON

## 2013-06-16 LAB — RAPID URINE DRUG SCREEN, HOSP PERFORMED
Amphetamines: NOT DETECTED
Barbiturates: NOT DETECTED
Barbiturates: NOT DETECTED
Benzodiazepines: NOT DETECTED
Benzodiazepines: NOT DETECTED
Cocaine: NOT DETECTED
Tetrahydrocannabinol: POSITIVE — AB

## 2013-06-16 LAB — TROPONIN I
Troponin I: <0.3 ng/mL (ref <0.30)
Troponin I: <0.3 ng/mL (ref <0.30)
Troponin I: <0.3 ng/mL (ref <0.30)

## 2013-06-16 LAB — PROTIME-INR: INR: 3.33 — ABNORMAL HIGH (ref 0.00–1.49)

## 2013-06-16 MED ORDER — WARFARIN SODIUM 5 MG PO TABS
5.0000 mg | ORAL_TABLET | Freq: Once | ORAL | Status: AC
  Administered 2013-06-16: 5 mg via ORAL
  Filled 2013-06-16: qty 1

## 2013-06-16 NOTE — Progress Notes (Signed)
OT Cancellation Note  Patient Details Name: Mary [name] MRN: [id number] DOB: November 27, 1960   Cancelled Treatment:    Reason Eval/Treat Not Completed: OT screened, no needs identified, will sign off.   06/16/2013 Cipriano Mile OTR/L Pager [phone] Office [phone]

## 2013-06-16 NOTE — H&P (Signed)
FMTS Attending Note  I personally saw and evaluated the patient. The plan of care was discussed with the resident team. I agree with the assessment and plan as documented by the resident.   52 y/o female s/p mitral valve replacement and tricuspid valve repair on 05/23/13 presents with presyncopal episode as documented, patient feels much improved this am, no lightheadedness, no CP, no SOB, no N/V, tolerating her breakfast  Gen: pleasant AAF, sitting in chair, NAD Cardiac: RRR, S1 and S2 present with metallic click, no murmurs, no heaves/thrills RESP: CTAB, normal effort ABD: soft, bowel sounds present, nontender, Ext: no edema, 2+ DP/PT pulses  1. Presyncope - no further episodes, workup unremarkable thus far, suspect dehydration due to poor recent PO intake/loose stools, agree with CT surgery input given MVR/Tricuspic valve repair in the past month, agree with workup as outlined in the resident note 2. Recent MVR/tricuspid valve repair - appreciate CT surgery input, will defer decision for another Echo to cardiology 3. Reported fevers - afebrile in hospital with no evidence of acute infection, will follow urine/blood cultures, agree with no antibiotics at this time  Donnella Sham MD

## 2013-06-16 NOTE — Progress Notes (Signed)
Utilization Review completed.  

## 2013-06-16 NOTE — Progress Notes (Signed)
Had ambulated patient with initial O2 saturation at 100% on room air and on bedrest . On ambulation the O2 saturation had ranged between 98% - 100%. No complaints of SOB nor lightheadedness. Pt had tolerated it ambulation very well !

## 2013-06-16 NOTE — Progress Notes (Signed)
ANTICOAGULATION CONSULT NOTE - Follow-up Consult  Pharmacy Consult for Coumadin Indication: h/o MVR  Allergies  Allergen Reactions  . Oxycodone Itching    Patient Measurements: Weight: 97 lb 11.2 oz (44.316 kg) Vital Signs: Temp: 98.2 F (36.8 C) (09/21 0554) Temp src: Oral (09/21 0554) BP: 115/69 mmHg (09/21 0554) Pulse Rate: 88 (09/21 0554)  Labs:  Recent Labs  06/15/13 1430 06/15/13 2050 06/16/13 0100 06/16/13 0525 06/16/13 0550  HGB 11.4*  --   --   --  9.9*  HCT 32.6*  --   --   --  29.6*  PLT 593*  --   --   --  505*  APTT  --  49*  --   --   --   LABPROT  --  33.4*  --   --  32.6*  INR  --  3.44*  --   --  3.33*  CREATININE 1.00  --   --   --  0.96  TROPONINI  --   --  <0.30 <0.30  --     The CrCl is unknown because both a height and weight (above a minimum accepted value) are required for this calculation.  Assessment: 52 yo female admitted with dizziness, recent MVR, to continue Coumadin. Home dose 5mg  TTSS and 7.5mg  MWF. INR therapeutic today. H/H plt slight drop today likely secondary to rehydration. No signs of bleeding noted per nurse.     Goal of Therapy:  INR 2.5-3.5 Monitor platelets by anticoagulation protocol: Yes   Plan:  Coumadin 5 mg tonight as per home regimen Daily INR  Thank you, Piedad Climes, PharmD Clinical Pharmacist - Resident Pager: [phone] Pharmacy: [phone] 06/16/2013 9:24 AM

## 2013-06-16 NOTE — Evaluation (Addendum)
Physical Therapy Evaluation Patient Details Name: ALEZANDRA EGLI MRN: [id number] DOB: 1961/04/06 Today's Date: 06/16/2013 Time: 1191-4782 PT Time Calculation (min): 20 min  PT Assessment / Plan / Recommendation History of Present Illness  Jadamarie MIKALYN HERMIDA is a 52 y.o. female presenting with presyncope. 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   Clinical Impression  Pt moving well and VSS during ambulation.  Pt completed stair negotiation and able to ambulate 400'.  No further PT needs at this time.  PT will sign off.  Notified OT of no OT needs as well. OT plans to sign off.     PT Assessment  Patent does not need any further PT services    Follow Up Recommendations  No PT follow up    Equipment Recommendations  None recommended by PT    Precautions / Restrictions Precautions Precautions: None   Pertinent Vitals/Pain No c/o pain; VSS      Mobility  Bed Mobility Bed Mobility: Not assessed Transfers Transfers: Sit to Stand;Stand to Sit Sit to Stand: 6: Modified independent (Device/Increase time);From chair/3-in-1 Stand to Sit: 6: Modified independent (Device/Increase time);To chair/3-in-1 Ambulation/Gait Ambulation/Gait Assistance: 7: Independent Ambulation Distance (Feet): 400 Feet Assistive device: None Stairs: Yes Stairs Assistance: 6: Modified independent (Device/Increase time) Stair Management Technique: One rail Right Number of Stairs: 11    Exercises     PT Diagnosis:    PT Problem List:   PT Treatment Interventions:       PT Goals(Current goals can be found in the care plan section) Acute Rehab PT Goals Patient Stated Goal: pt wants to return to helping cater PT Goal Formulation: No goals set, d/c therapy  Visit Information  Last PT Received On: 06/16/13 Assistance Needed: +1 History of Present Illness: Delano [name] [name] is a 53 y.o.  female presenting with presyncope. 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        Prior Functioning  Home Living Family/patient expects to be discharged to:: Private residence Living Arrangements: Children Available Help at Discharge: Family;Available PRN/intermittently Type of Home: Apartment Home Access: Stairs to enter Entrance Stairs-Number of Steps: 18 Entrance Stairs-Rails: Right;Left Home Layout: One level Home Equipment: None Additional Comments: Pt plans to go to dtr's home that has onlty one step and one level. Prior Function Level of Independence: Independent Comments: pt hasn't worked for 2 months and does not drive Communication Communication: No difficulties    Cognition  Cognition Arousal/Alertness: Awake/alert Behavior During Therapy: WFL for tasks assessed/performed Overall Cognitive Status: Within Functional Limits for tasks assessed    Extremity/Trunk Assessment Lower Extremity Assessment Lower Extremity Assessment: Overall WFL for tasks assessed Cervical / Trunk Assessment Cervical / Trunk Assessment: Normal   Balance    End of Session PT - End of Session Equipment Utilized During Treatment: Gait belt Activity Tolerance: Patient tolerated treatment well Patient left: in chair;with call bell/phone within reach Nurse Communication: Mobility status  GP Functional Limitation: Mobility: Walking and moving around Mobility: Walking and Moving Around Current Status [phone]): 0 percent impaired, limited or restricted Mobility: Walking and Moving Around Discharge Status [id number]): 0 percent impaired, limited or restricted   Lainy Wrobleski 06/16/2013, 4:59 PM  Jake Shark, PT DPT [phone]

## 2013-06-16 NOTE — Progress Notes (Addendum)
FMTS Attending Note  I personally saw and evaluated the patient. The plan of care was discussed with the resident team. I agree with the assessment and plan as documented by the resident.   Repeat EKG showed 1st degree AV block, changed from EKG last evening which showed junctional block, will ask cardiology to see and evaluate patient given history of complete heart block earlier this month.  Donnella Sham MD

## 2013-06-16 NOTE — Progress Notes (Signed)
Subjective: 52 yo woman who had a mitral vale replacement(mechanical) and tricuspid valve repair on 8/28. Discharged on 9/9. Was seen in the office 9/15 at which time she making slow but steady progress. She says she began having low grade fevers and diarrhea about 3 days ago. She was readmitted after a presyncopal episode. She received IVF in ED before orthostatics were done. She does feel better since she was rehydrated and readmitted.  Past Medical History  Diagnosis Date  . Heart murmur   . CAP (community acquired pneumonia) 03/15/2013    "maybe; that's what they are thinking today" (03/15/2013)  . Asthma     "as a baby"   . Orthopnoea 03/15/2013    "in the last 2 days" (03/15/2013)  . Anemia   . History of blood transfusion     "14 w/1st pregnancy; 2 w/last C-section" (03/15/2013)  . GERD (gastroesophageal reflux disease)   . Hypertension     no pcp   will go to [facility]  . Severe mitral regurgitation 04/22/2013  . SVT (supraventricular tachycardia) 03/16/2013  . Acute diastolic heart failure 03/15/2013  . 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 Tricuspid regurgitation   . 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    33mm Sorin Carbomedics mechanical prosthesis via right mini thoracotomy approach  . 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 Complete heart block 05/28/2013    Post-op     Objective: Vital signs in last 24 hours: Temp:  [98.2 F (36.8 C)-99.3 F (37.4 C)] 98.2 F (36.8 C) (09/21 0554) Pulse Rate:  [88-108] 88 (09/21 0554) Cardiac Rhythm:  [-] Normal sinus rhythm (09/21 1128) Resp:  [15-18] 18 (09/21 0554) BP: (92-135)/(63-78) 115/69 mmHg (09/21 0554) SpO2:  [100 %] 100 % (09/21 0554) Weight:  [97 lb 11.2 oz (44.316 kg)] 97 lb 11.2 oz (44.316 kg) (09/21  0554)  Hemodynamic parameters for last 24 hours:    Intake/Output from previous day:   Intake/Output this shift: Total I/O In: 300 [P.O.:300] Out: 51 [Urine:50; Stool:1]  General appearance: alert and no distress Neurologic: intact Heart: regular rate and rhythm and faint systolic murmur, good valve click Lungs: clear to auscultation bilaterally Abdomen: normal findings: soft, non-tender Wound: clean and dry  Lab Results:  Recent Labs  06/16/13 0550 06/16/13 1115  WBC 9.1 9.3  HGB 9.9* 10.7*  HCT 29.6* 31.7*  PLT 505* 498*   BMET:  Recent Labs  06/15/13 1430 06/16/13 0550  NA 138 135  K 5.0 3.7  CL 101 103  CO2 23 21  GLUCOSE 77 92  BUN 8 8  CREATININE 1.00 0.96  CALCIUM 9.4 9.2    PT/INR:  Recent Labs  06/16/13 0550  LABPROT 32.6*  INR 3.33*   ABG    Component Value Date/Time   PHART 7.291* 05/24/2013 0644   HCO3 17.2* 05/24/2013 0644   TCO2 23 05/24/2013 1814   ACIDBASEDEF 8.0* 05/24/2013 0644   O2SAT 92.0 05/24/2013 0644   CBG (last 3)  No results found for this basename: GLUCAP,  in the last 72 hours  CXR CLINICAL DATA: Dizziness and cough  EXAM:  CHEST 2 VIEW  COMPARISON: June 10, 2013  FINDINGS:  The lungs are clear. Heart size and pulmonary vascularity are  normal. No adenopathy. There are mitral and tricuspid valve  replacements. No bony lesions.  IMPRESSION:  Prosthetic mitral and tricuspid valves. No edema or consolidation.  Electronically Signed  By: Bretta Bang  On: 06/15/2013 15:14   Assessment/Plan: 52 yo female about 3 weeks postop from MVR, tricuspid repair. She presents after a presyncopal spell and has been having general malaise and fatigue.  She has been having diarrhea and I suspect she may have been a little intravscularly dry prior to hydration. She does feel better after hydration.  I would suggest having EP see her as she did have CHB postop and has a long PR on ECG.    LOS: 1 day     Queena Monrreal C 06/16/2013

## 2013-06-16 NOTE — Consult Note (Signed)
Cardiology Consult Note  Admit date: 06/15/2013 Name: Mary [name] 52 y.o.  female DOB:  07-22-1961 MRN:  [id number]  Today's date:  06/16/2013  Referring Physician:    [facility]  Reason for Consultation:  Presyncope  IMPRESSIONS: 1. Several day history of malaise, fatigue and low-grade fever with episode of presyncope today. This does not sound like a primary arrhythmic event to me 2. History of recent mitral valve replacement and tricuspid annuloplasty 3. History of complete heart block which resolved around the perioperative period.  4. 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 Obtain blood cultures to be sure that she does not have prosthetic valve endocarditis  2. Repeat echocardiogram 3. Continue observational telemetry while in hospital 4. Electrophysiology will see in the morning.  HISTORY: Complicated 52 year old female had an admission earlier this summer where she may have had pneumonia versus endocarditis. She was found to have significant mitral regurgitation and was treated for endocarditis. She evidently was felt to have culture-negative endocarditis and was treated and later had dental extractions. She had mitral valve replacement with a mechanical valve on August 28 as well as a complex tricuspid annuloplasty and tricuspid valve repair. Postoperatively she did have some complete heart block that resolved in the and was noted to have first degree AV block which was marked with some evidence of Mobitz one block. She was seen by Dr. Johney Frame who felt that she did not need to have a pacemaker at this time. She was discharged on the ninth. She was admitted to the hospital with an episode of presyncope. She does not drive and had called the ambulance because she had been sick for about 4 days with malaise, fatigue and what she thought was low-grade fever. She has not had any chest pain or PND or worsening heart failure. Her BNP was elevated but  was down significantly from her one preoperatively. Today she called the ambulance hydrated.since she did not drive and while getting up to get them in the room felt somewhat weak and slid down against the wall but did not pass out. She had somewhat poor by mouth intake the past few days. She feels better at the present time after having been hydrated. Past Medical History  Diagnosis Date  . Asthma     "as a baby"   . Anemia   . History of blood transfusion     "14 w/1st pregnancy; 2 w/last C-section" (03/15/2013)  . GERD (gastroesophageal reflux disease)   . Hypertension     no pcp   will go to [facility]  . Severe mitral regurgitation 04/22/2013  . SVT (supraventricular tachycardia) 03/16/2013  . Acute diastolic heart failure 03/15/2013  . 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 Tricuspid regurgitation   . 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    33mm Sorin Carbomedics mechanical prosthesis via right mini thoracotomy approach  . 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 Complete heart block 05/28/2013    Post-op      Past Surgical History  Procedure Laterality Date  . Appendectomy  ~1978  . Cesarean section  1983; 1999  . Cholecystectomy  2000's  . Tubal ligation  1999  . Tee without cardioversion N/A 03/18/2013    Procedure: TRANSESOPHAGEAL ECHOCARDIOGRAM (TEE);  Surgeon: Laurey Morale, MD;  Location: [facility] ENDOSCOPY;  Service: Cardiovascular;  Laterality: N/A;  . Cardiac catheterization    . Multiple extractions with alveoloplasty N/A 04/04/2013    Procedure: Extraction of tooth #'s 1,8,9,13,14,15,23,24,25,26 with alveoloplasty and gross debridement of remaining teeth;  Surgeon: Charlynne Pander, DDS;  Location[facility] OR;  Service: Oral Surgery;  Laterality: N/A;  . Minimally  invasive tricuspid valve repair Right 05/23/2013    Procedure: MINIMALLY INVASIVE TRICUSPID VALVE REPAIR;  Surgeon: Purcell Nails, MD;  Location: [facility] OR;  Service: Open Heart Surgery;  Laterality: Right;  . Intraoperative transesophageal echocardiogram N/A 05/23/2013    Procedure: INTRAOPERATIVE TRANSESOPHAGEAL ECHOCARDIOGRAM;  Surgeon: Purcell Nails, MD;  Location: [facility] OR;  Service: Open Heart Surgery;  Laterality: N/A;  . Femoral hernia repair Right 05/23/2013    Procedure: HERNIA REPAIR FEMORAL;  Surgeon: Purcell Nails, MD;  Location: [facility] OR;  Service: Open Heart Surgery;  Laterality: Right;  . Mitral valve replacement N/A 05/23/2013    Procedure: MITRAL VALVE (MV) REPLACEMENT;  Surgeon: Purcell Nails, MD;  Location: [facility] OR;  Service: Open Heart Surgery;  Laterality: N/A;     Allergies:  is allergic to oxycodone.   Medications: Prior to Admission medications   Medication Sig Start Date End Date Taking? Authorizing Provider  aspirin 81 MG chewable tablet Chew 1 tablet (81 mg total) by mouth daily. 03/26/13  Yes Marianne L York, PA-C  warfarin (COUMADIN) 2.5 MG tablet Take 5 mg by mouth daily. Take three (3) tablets 7.5 mg total on Monday, Wednesday, and Fridays at 6 PM. Two (2) tablets 5 mg on other days.   Yes Historical Provider, MD    Family History: No family status information on file.    Social History:   reports that she has been smoking Cigarettes.  She has a 18 pack-year smoking history. She has never used smokeless tobacco. She reports that she drinks about 3.6 ounces of alcohol per week. She reports that she does not use illicit drugs.   History   Social History Narrative  . No narrative on file    Review of Systems: Other than noted above unremarkable.  Physical Exam: BP 124/67  Pulse 85  Temp(Src) 99.3 F (37.4 C) (Oral)  Resp 16  Wt 44.316 kg (97 lb 11.2 oz)  BMI 17.86 kg/m2  SpO2 100%  LMP 01/28/2013  General appearance: Pleasant black female who is  currently in no acute distress Head: Normocephalic, without obvious abnormality, atraumatic Eyes: conjunctivae/corneas clear. PERRL, EOM's intact. Fundi benign. Neck: no adenopathy, no carotid bruit, no JVD, supple, symmetrical, trachea midline and thyroid not enlarged, symmetric, no tenderness/mass/nodules Lungs: clear to auscultation bilaterally Heart: Normal S1-S2, no S3, valve clicks well heard, no murmur appreciated, no JVD Abdomen: soft, non-tender; bowel sounds normal; no masses,  no organomegaly Pelvic: deferred Extremities: extremities normal, atraumatic, no cyanosis or edema Pulses: 2+ and symmetric Neurologic: Grossly normal   Labs: CBC  Recent Labs  06/15/13 1430  06/16/13 1115  WBC 8.8  < > 9.3  RBC 4.15  < > 4.00  HGB 11.4*  < > 10.7*  HCT 32.6*  < > 31.7*  PLT 593*  < > 498*  MCV 78.6  < > 79.3  MCH 27.5  < > 26.8  MCHC 35.0  < > 33.8  RDW 17.7*  < > 17.8*  LYMPHSABS 1.3  --   --   MONOABS 0.6  --   --   EOSABS 0.2  --   --   BASOSABS 0.0  --   --   < > =  values in this interval not displayed. CMP   Recent Labs  06/15/13 1430 06/16/13 0550  NA 138 135  K 5.0 3.7  CL 101 103  CO2 23 21  GLUCOSE 77 92  BUN 8 8  CREATININE 1.00 0.96  CALCIUM 9.4 9.2  PROT 7.8  --   ALBUMIN 3.7  --   AST 22  --   ALT 10  --   ALKPHOS 87  --   BILITOT 0.3  --   GFRNONAA 65* 68*  GFRAA 75* 79*   BNP (last 3 results)  Recent Labs  03/15/13 2023 04/15/13 1237 06/15/13 1430  PROBNP 4536.0* 5910.0* 908.3*   Cardiac Panel (last 3 results)  Recent Labs  06/16/13 0100 06/16/13 0525 06/16/13 1115  TROPONINI <0.30 <0.30 <0.30     Radiology: Clear lung fields, prosthetic mitral valve and tricuspid ring seen  EKG: Right atrial enlargement, sinus rhythm with marked first degree AV block  Signed:  W. Ashley Royalty MD [facility]   Cardiology Consultant  06/16/2013, 9:47 PM

## 2013-06-16 NOTE — Progress Notes (Signed)
Family Medicine Teaching Service Daily Progress Note Intern Pager: [phone]  Patient name: Mary [name] Medical record number: [id number] Date of birth: [birth year] Age: 52 y.o. Gender: female  Primary Care Provider: No PCP Per Patient Consultants: [facility] Code Status: Full  Pt Overview and Major Events to Date:  8/28: mitral valve replacement and tricuspid valve repair surgery  9/20: Presyncope; BNP ~ 900  Assessment and Plan: Mary [name] is a 52 y.o. female presenting with presyncope. 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   # Presyncope - DDx includes dehydration (orthostatics negative after 1L bolus in ED), thyroid dysfunction, anemia (hgb stable @11 .4), vasovagal and psychogenic; Less likely infectious (Tmax 100 WBC wnl, CXR no consolidation, procalcitonin and lactic acid wnl) but considering endocarditis given recent cardiac instrumentation, neurological (No CN deficits or unilateral weakness), electrolyte abnormality/uremia (BMET wnl). TEE during surgery: Systolic function was normal. EF 60% Wall motion was normal  - ProBNP >900 but decreased from previous 5900 (7/21 prior to recent cardiac surgery)  - Daily wts and Strict I&O  - ED ordered UA and blood culture - will follow-up  - TSH & UDS in progress - Cylce trop's (Neg x 2) & repeat EKG 9/21: Sinus rhythm with 1st degree A-V block; No acute ST changes  - Repeat BMP (GFR 68 otherwise wnl) & CBC: Hgb 9.9 < 11.4; Plts 505 < 595: Will reassess CBC today - PT/OT to evaluate   # Fevers  - Reported fever at home w/ Tmax 100. Afebrile on admission. CXR, CBC, lipase, procalcitonin, and lactic acid WNL.  - F/u UA, blood culture  - Consider ECHO; will discuss with Cardiac surgeon   # Recent Cardiac Surgery  - mitral valve replacement, tricuspid valve repair, and repair of right femoral hernia  05/23/13 on coumadin - seen by Dr. Joycelyn Man 9/15 with no issues. C - Thrombocytosis: 505 (9/21) < 593 (9/20) - INR 3.33 (9/21) goal: 2.5-3.5  - Coumadin per pharmacy  - ASA 81mg   - [facility] notified will see today  FEN/GI:  Diet: Heart Healthy  Saline Lock  Prophylaxis: On Warfarin   Disposition: On telemetry; D/c home pending PT/OT evaluation and Cardiac surgeon evaluation  Subjective: She reports feeling stronger this morning. Was able to get up and go to restroom yesterday without dizziness. Denies CP or SOB.   Objective: Temp:  [98.5 F (36.9 C)-99.3 F (37.4 C)] 99.3 F (37.4 C) (09/20 2020) Pulse Rate:  [90-108] 90 (09/20 2020) Resp:  [15-18] 17 (09/20 2020) BP: (92-135)/(63-78) 135/78 mmHg (09/20 2020) SpO2:  [97 %-100 %] 100 % (09/20 2020)  Physical Exam: Gen: Thin frail female in NAD  Neuro: A&Ox4 HEENT: MMM  CV: RRR, No JVD; Pulses +2 b/l; CR < 3 secs  Lungs: CTAB; decreased breath sounds b/l  Lower Ext: No skin changes; No edema; distal pulses intact; Calves nontender  Laboratory: Results for orders placed during the hospital encounter of 06/15/13 (from the past 24 hour(s))  CBC WITH DIFFERENTIAL     Status: Abnormal   Collection Time    06/15/13  2:30 PM      Result Value Range   WBC 8.8  4.0 - 10.5 K/uL   RBC 4.15  3.87 - 5.11 MIL/uL   Hemoglobin 11.4 (*) 12.0 - 15.0 g/dL   HCT 14.7 (*) 82.9 - 56.2 %   MCV 78.6  78.0 - 100.0 fL  MCH 27.5  26.0 - 34.0 pg   MCHC 35.0  30.0 - 36.0 g/dL   RDW 16.1 (*) 09.6 - 04.5 %   Platelets 593 (*) 150 - 400 K/uL   Neutrophils Relative % 76  43 - 77 %   Neutro Abs 6.7  1.7 - 7.7 K/uL   Lymphocytes Relative 15  12 - 46 %   Lymphs Abs 1.3  0.7 - 4.0 K/uL   Monocytes Relative 7  3 - 12 %   Monocytes Absolute 0.6  0.1 - 1.0 K/uL   Eosinophils Relative 2  0 - 5 %   Eosinophils Absolute 0.2  0.0 - 0.7 K/uL   Basophils Relative 0  0 - 1 %   Basophils Absolute 0.0  0.0 - 0.1 K/uL  COMPREHENSIVE METABOLIC  PANEL     Status: Abnormal   Collection Time    06/15/13  2:30 PM      Result Value Range   Sodium 138  135 - 145 mEq/L   Potassium 5.0  3.5 - 5.1 mEq/L   Chloride 101  96 - 112 mEq/L   CO2 23  19 - 32 mEq/L   Glucose, Bld 77  70 - 99 mg/dL   BUN 8  6 - 23 mg/dL   Creatinine, Ser 4.09  0.50 - 1.10 mg/dL   Calcium 9.4  8.4 - 81.1 mg/dL   Total Protein 7.8  6.0 - 8.3 g/dL   Albumin 3.7  3.5 - 5.2 g/dL   AST 22  0 - 37 U/L   ALT 10  0 - 35 U/L   Alkaline Phosphatase 87  39 - 117 U/L   Total Bilirubin 0.3  0.3 - 1.2 mg/dL   GFR calc non Af Amer 65 (*) >90 mL/min   GFR calc Af Amer 75 (*) >90 mL/min  LIPASE, BLOOD     Status: None   Collection Time    06/15/13  2:30 PM      Result Value Range   Lipase 34  11 - 59 U/L  LACTIC ACID, PLASMA     Status: None   Collection Time    06/15/13  2:30 PM      Result Value Range   Lactic Acid, Venous 1.1  0.5 - 2.2 mmol/L  PRO B NATRIURETIC PEPTIDE     Status: Abnormal   Collection Time    06/15/13  2:30 PM      Result Value Range   Pro B Natriuretic peptide (BNP) 908.3 (*) 0 - 125 pg/mL  POCT I-STAT TROPONIN I     Status: None   Collection Time    06/15/13  4:20 PM      Result Value Range   Troponin i, poc 0.00  0.00 - 0.08 ng/mL   Comment 3           PROCALCITONIN     Status: None   Collection Time    06/15/13  5:05 PM      Result Value Range   Procalcitonin <0.10    APTT     Status: Abnormal   Collection Time    06/15/13  8:50 PM      Result Value Range   aPTT 49 (*) 24 - 37 seconds  PROTIME-INR     Status: Abnormal   Collection Time    06/15/13  8:50 PM      Result Value Range   Prothrombin Time 33.4 (*) 11.6 - 15.2 seconds   INR  3.44 (*) 0.00 - 1.49  TROPONIN I     Status: None   Collection Time    06/16/13  1:00 AM      Result Value Range   Troponin I <0.30  <0.30 ng/mL   Imaging/Diagnostic Tests: CXR IMPRESSION: Prosthetic mitral and tricuspid valves. No edema or consolidation.  Wenda Low, MD 06/16/2013, 4:30  AM PGY-1, [facility] Family Medicine FPTS Intern pager: [phone], text pages welcome

## 2013-06-17 ENCOUNTER — Encounter (HOSPITAL_COMMUNITY): Payer: Self-pay | Admitting: *Deleted

## 2013-06-17 ENCOUNTER — Encounter (HOSPITAL_COMMUNITY)
Admission: EM | Disposition: A | Discharge status: Home / Self Care | Payer: Self-pay | Source: Home / Self Care | Attending: Emergency Medicine

## 2013-06-17 DIAGNOSIS — I059 Rheumatic mitral valve disease, unspecified: Secondary | ICD-10-CM

## 2013-06-17 DIAGNOSIS — Z8679 Personal history of other diseases of the circulatory system: Secondary | ICD-10-CM

## 2013-06-17 DIAGNOSIS — I440 Atrioventricular block, first degree: Secondary | ICD-10-CM

## 2013-06-17 DIAGNOSIS — R55 Syncope and collapse: Secondary | ICD-10-CM

## 2013-06-17 HISTORY — PX: TEE WITHOUT CARDIOVERSION: SHX5443

## 2013-06-17 LAB — PROTIME-INR
INR: 3.11 — ABNORMAL HIGH (ref 0.00–1.49)
Prothrombin Time: 30.9 seconds — ABNORMAL HIGH (ref 11.6–15.2)

## 2013-06-17 SURGERY — ECHOCARDIOGRAM, TRANSESOPHAGEAL
Anesthesia: Moderate Sedation

## 2013-06-17 MED ORDER — DIPHENHYDRAMINE HCL 50 MG/ML IJ SOLN
INTRAMUSCULAR | Status: AC
  Filled 2013-06-17: qty 1

## 2013-06-17 MED ORDER — SODIUM CHLORIDE 0.9 % IV SOLN
INTRAVENOUS | Status: DC
  Administered 2013-06-18: 12:00:00 via INTRAVENOUS

## 2013-06-17 MED ORDER — FENTANYL CITRATE 0.05 MG/ML IJ SOLN
INTRAMUSCULAR | Status: AC
  Filled 2013-06-17: qty 2

## 2013-06-17 MED ORDER — ACETAMINOPHEN 325 MG PO TABS
650.0000 mg | ORAL_TABLET | Freq: Four times a day (QID) | ORAL | Status: DC | PRN
  Administered 2013-06-17 – 2013-06-18 (×2): 650 mg via ORAL
  Filled 2013-06-17 (×2): qty 2

## 2013-06-17 MED ORDER — MIDAZOLAM HCL 10 MG/2ML IJ SOLN
INTRAMUSCULAR | Status: DC | PRN
  Administered 2013-06-17: 1 mg via INTRAVENOUS
  Administered 2013-06-17 (×2): 2 mg via INTRAVENOUS

## 2013-06-17 MED ORDER — MIDAZOLAM HCL 5 MG/ML IJ SOLN
INTRAMUSCULAR | Status: AC
  Filled 2013-06-17: qty 2

## 2013-06-17 MED ORDER — BUTAMBEN-TETRACAINE-BENZOCAINE 2-2-14 % EX AERO
INHALATION_SPRAY | CUTANEOUS | Status: DC | PRN
  Administered 2013-06-17: 2 via TOPICAL

## 2013-06-17 MED ORDER — WARFARIN SODIUM 7.5 MG PO TABS
7.5000 mg | ORAL_TABLET | Freq: Once | ORAL | Status: AC
  Administered 2013-06-17: 7.5 mg via ORAL
  Filled 2013-06-17: qty 1

## 2013-06-17 MED ORDER — FENTANYL CITRATE 0.05 MG/ML IJ SOLN
INTRAMUSCULAR | Status: DC | PRN
  Administered 2013-06-17 (×2): 25 ug via INTRAVENOUS

## 2013-06-17 NOTE — Discharge Summary (Signed)
Family Medicine Teaching [facility] Discharge Summary  Patient name: Mary [name] Medical record number: [id number] Date of birth: 03/16/1961 Age: 52 y.o. Gender: female Date of Admission: 06/15/2013  Date of Discharge: 06/18/13 Admitting Physician: Uvaldo Rising, MD  Primary Care Provider: No PCP Per Patient Consultants: Cardiology, Cardiothoracic surgery, EP  Indication for Hospitalization: Presyncope  Discharge Diagnoses/Problem List:  Patient Active Problem List   Diagnosis Date Noted  . First degree heart block 06/17/2013  . Weakness 06/16/2013  . (HFpEF) heart failure with preserved ejection fraction 06/16/2013  . Anemia 06/16/2013  . Thrombocytosis 06/16/2013  . Heart valve replaced by other means 06/10/2013  . Long term (current) use of anticoagulants 06/10/2013  . Complete heart block 05/28/2013  . S/P minimally-invasive mitral valve replacement with mechanical valve and tricuspid valve repair 05/23/2013  . Mitral valve disorders 04/22/2013  . Tricuspid regurgitation 04/22/2013  . 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 SVT (supraventricular tachycardia) 03/16/2013   Disposition: Home  Discharge Condition: Stable  Brief Hospital Course: Mary [name] is a 52 y.o. female who presened with presyncope. 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 Due to her recent cardiac surgery, UDS (+) for THC and subjective fevers at home there was concern endocarditis, perivalvular abcess, or pericardial effusion. Her BNP was 900 which was decreased from ~6000 2 months ago. Cardiothoracic surgery, Cardiology and EP were consulted and a TEE was performed which showed: MVR (St Jude valve) with mild MR; small oscillating density on medial anulus most likely represents suture; s/p TV repair with mild MR; echodensity in right atrium of uncertain etiology  (? layered thrombus from recent surgery); no obvious vegetations. Her Troponins were negative x 3, and EKG show 1st degree AV block similar to her EKG after surgery. PT evaluated her and she had improved strength, no CP and no SOB on discharge. She was schedule to receive a loop monitor at her Cardiology apt in 1 week.    Issues for Follow Up:  1. Loop recorder for 30 days for 1st degree AV block 2. Continue to monitor INR and adjust Coumadin   Significant Procedures: TEE  Significant Labs and Imaging:   Recent Labs Lab 06/15/13 1430 06/16/13 0550 06/16/13 1115  WBC 8.8 9.1 9.3  HGB 11.4* 9.9* 10.7*  HCT 32.6* 29.6* 31.7*  PLT 593* 505* 498*    Recent Labs Lab 06/15/13 1430 06/16/13 0550  NA 138 135  K 5.0 3.7  CL 101 103  CO2 23 21  GLUCOSE 77 92  BUN 8 8  CREATININE 1.00 0.96  CALCIUM 9.4 9.2  ALKPHOS 87  --   AST 22  --   ALT 10  --   ALBUMIN 3.7  --     Recent Labs Lab 06/15/13 2050 06/16/13 0550 06/17/13 0438 06/18/13 0525  INR 3.44* 3.33* 3.11* 2.69*    Imaging/Diagnostic Tests:  CXR IMPRESSION: Prosthetic mitral and tricuspid valves. No edema or consolidation.   TEE  - S/P MVR (St Jude valve) with mild MR; small oscillating density on medial anulus most likely represents suture; s/p TV repair with mild MR; echodensity in right atrium of uncertain etiology (? layered thrombus from recent surgery); no obvious vegetations.   Results/Tests Pending at Time of Discharge: None  Discharge Medications:    Medication List         aspirin 81 MG chewable tablet  Chew 1  tablet (81 mg total) by mouth daily.     warfarin 2.5 MG tablet  Commonly known as:  COUMADIN  Take 5 mg by mouth daily. Take three (3) tablets 7.5 mg total on Monday, Wednesday, and Fridays at 6 PM. Two (2) tablets 5 mg on other days.       Discharge Instructions: Please refer to Patient Instructions section of EMR for full details.  Patient was counseled important signs and  symptoms that should prompt return to medical care, changes in medications, dietary instructions, activity restrictions, and follow up appointments.   Wenda Low, MD 06/17/2013, 4:12 PM PGY-1, [facility] Family Medicine

## 2013-06-17 NOTE — Interval H&P Note (Signed)
History and Physical Interval Note:  06/17/2013 3:53 PM  Mary [name]  has presented today for surgery, with the diagnosis of r/o endocarditis  The various methods of treatment have been discussed with the patient and family. After consideration of risks, benefits and other options for treatment, the patient has consented to  Procedure(s): TRANSESOPHAGEAL ECHOCARDIOGRAM (TEE) (N/A) as a surgical intervention .  The patient's history has been reviewed, patient examined, no change in status, stable for surgery.  I have reviewed the patient's chart and labs.  Questions were answered to the patient's satisfaction.     Olga Millers

## 2013-06-17 NOTE — Progress Notes (Signed)
  Echocardiogram Echocardiogram Transesophageal has been performed.  Mary [name] 06/17/2013, 4:44 PM

## 2013-06-17 NOTE — CV Procedure (Signed)
See full TEE report in [employer].  Brian Crenshaw  

## 2013-06-17 NOTE — Progress Notes (Addendum)
Advanced Heart Failure Rounding Note   Subjective:    52 yo woman who had a mitral valve replacement(mechanical) and tricuspid valve repair on 8/28. Post-op course c/b CHB and seen by EP. Resolved spontaneously. Discharged on 9/9. PR interval 162 ms on d/c ECG.    Was seen in the office 9/15 by Dr. Dorris Fetch at which time she making slow but steady progress. She says she began having low grade fevers and diarrhea about 3 days ago. She was readmitted after a presyncopal episode. She received IVF in ED before orthostatics were done.  She does feel better since she was rehydrated and readmitted. Still dizzy at times with low-grade temps.   ECG with long 1AVB ( ) Seen by Dr. Donnie Aho. BCX and echo ordered.    Objective:   Weight Range:  Vital Signs:   Temp:  [98.2 F (36.8 C)-99.3 F (37.4 C)] 98.2 F (36.8 C) (09/22 0444) Pulse Rate:  [85-99] 99 (09/22 0444) Resp:  [16] 16 (09/22 0444) BP: (101-124)/(61-67) 101/61 mmHg (09/22 0444) SpO2:  [100 %] 100 % (09/22 0444) Weight:  [42.547 kg (93 lb 12.8 oz)] 42.547 kg (93 lb 12.8 oz) (09/22 0641) Last BM Date: 06/16/13  Weight change: Filed Weights   06/15/13 2015 06/16/13 0554 06/17/13 0641  Weight: 44.316 kg (97 lb 11.2 oz) 44.316 kg (97 lb 11.2 oz) 42.547 kg (93 lb 12.8 oz)    Intake/Output:   Intake/Output Summary (Last 24 hours) at 06/17/13 0806 Last data filed at 06/17/13 0448  Gross per 24 hour  Intake    303 ml  Output    427 ml  Net   -124 ml     Physical Exam: Sitting in chair. in no acute distress  HEENT: normal x for poor dentition Neck: No JVD. Carotids 2+ bilaterally Lungs: clear to auscultation bilaterally  Heart: RRR. Mechanical s1. No murmur Abdomen: soft, non-tender; bowel sounds normal; no masses, no organomegaly  Extremities: extremities normal, atraumatic, no cyanosis or edema. No rash Pulses: 2+ and symmetric  Neurologic: Grossly normal   Telemetry: Stach. 1AVB. No pauses.  Labs: Basic  Metabolic Panel:  Recent Labs Lab 06/15/13 1430 06/16/13 0550  NA 138 135  K 5.0 3.7  CL 101 103  CO2 23 21  GLUCOSE 77 92  BUN 8 8  CREATININE 1.00 0.96  CALCIUM 9.4 9.2    Liver Function Tests:  Recent Labs Lab 06/15/13 1430  AST 22  ALT 10  ALKPHOS 87  BILITOT 0.3  PROT 7.8  ALBUMIN 3.7    Recent Labs Lab 06/15/13 1430  LIPASE 34   No results found for this basename: AMMONIA,  in the last 168 hours  CBC:  Recent Labs Lab 06/15/13 1430 06/16/13 0550 06/16/13 1115  WBC 8.8 9.1 9.3  NEUTROABS 6.7  --   --   HGB 11.4* 9.9* 10.7*  HCT 32.6* 29.6* 31.7*  MCV 78.6 79.1 79.3  PLT 593* 505* 498*    Cardiac Enzymes:  Recent Labs Lab 06/16/13 0100 06/16/13 0525 06/16/13 1115  TROPONINI <0.30 <0.30 <0.30    BNP: BNP (last 3 results)  Recent Labs  03/15/13 2023 04/15/13 1237 06/15/13 1430  PROBNP 4536.0* 5910.0* 908.3*     Other results:  EKG: ST 106. RSR'  1avb 270 ms +prolonged QT.   Imaging: Dg Chest 2 View  06/15/2013   CLINICAL DATA:  Dizziness and cough  EXAM: CHEST  2 VIEW  COMPARISON:  June 10, 2013  FINDINGS: The lungs are clear.  Heart size and pulmonary vascularity are normal. No adenopathy. There are mitral and tricuspid valve replacements. No bony lesions.  IMPRESSION: Prosthetic mitral and tricuspid valves. No edema or consolidation.   Electronically Signed   By: Bretta Bang   On: 06/15/2013 15:14      Medications:     Scheduled Medications: . aspirin  81 mg Oral Daily  . sodium chloride  1,000 mL Intravenous Once  . sodium chloride  3 mL Intravenous Q12H  . Warfarin - Pharmacist Dosing Inpatient   Does not apply q1800     Infusions:     PRN Medications:  ondansetron (ZOFRAN) IV, ondansetron   Assessment:   1. Presyncope 2. 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 Post-op CHB now with long 1AVB 4. Tobacco use 5.  Anemia   Plan/Discussion:    With fevers, worsening 1AVB and recent MVR for endocarditis, I worry about perivalvular abscess. She will need TEE today to further evaluate. Very low threshold to start abx. Continue coumadin.  Cerise Lieber,MD 8:11 AM  Length of Stay: 2 Advanced Heart Failure Team Pager [phone] (M-F; 7a - 4p)  Please contact [facility] for night-coverage after hours (4p -7a ) and weekends on [email or web address]  Addendum: ECG scanned in the computer for 06/05/13 is actually from 05/20/2013 (pre-op). This has normal PR interval. However in talking to Dr. Cornelius Moras this am she has had long 1st degree AVB since surgery so suspicion is more for post-op intrinsic conduction disease and not peri-valvular abscess although with low-grade temps and recent valve I think proceeding with TEE is reasonable prior to consideration of device implantation.   Ellon Marasco,MD 9:54 AM

## 2013-06-17 NOTE — Progress Notes (Signed)
Attending Addendum  I examined the patient and discussed the assessment and plan with Dr. Gayla Doss. I have reviewed the note and agree. I agree with TTE and TEE to further evaluate AV valves. Patient has no complaints this AM. She has not had repeat syncopal/presyncopal episodes. She reports that her loose stools resolved yesterday.     Dessa Phi, MD FAMILY MEDICINE TEACHING SERVICE

## 2013-06-17 NOTE — Progress Notes (Signed)
ANTICOAGULATION CONSULT NOTE - Follow Up Consult  Pharmacy Consult for Warfarin Indication: mechanical MVR  Allergies  Allergen Reactions  . Oxycodone Itching    Patient Measurements: Height: 5\' 2"  (157.5 cm) Weight: 93 lb 12.8 oz (42.547 kg) IBW/kg (Calculated) : 50.1  Vital Signs: Temp: 98.2 F (36.8 C) (09/22 0444) Temp src: Oral (09/22 0444) BP: 101/61 mmHg (09/22 0444) Pulse Rate: 99 (09/22 0444)  Labs:  Recent Labs  06/15/13 1430 06/15/13 2050 06/16/13 0100 06/16/13 0525 06/16/13 0550 06/16/13 1115 06/17/13 0438  HGB 11.4*  --   --   --  9.9* 10.7*  --   HCT 32.6*  --   --   --  29.6* 31.7*  --   PLT 593*  --   --   --  505* 498*  --   APTT  --  49*  --   --   --   --   --   LABPROT  --  33.4*  --   --  32.6*  --  30.9*  INR  --  3.44*  --   --  3.33*  --  3.11*  CREATININE 1.00  --   --   --  0.96  --   --   TROPONINI  --   --  <0.30 <0.30  --  <0.30  --     Estimated Creatinine Clearance: 47 ml/min (by C-G formula based on Cr of 0.96).   Medications:  PTA warfarin dose: 5mg  on TTSS, 7.5mg  on MWF  Assessment: 52 y/o F with recent MVR who continues on warfarin. INR 3.11<3.33, CBC low and stabe. Renal function stable. No overt bleeding noted.   Goal of Therapy:  INR 2.5-3.5 Monitor platelets by anticoagulation protocol: Yes   Plan:  -Warfarin 7.5mg  PO x 1 (per home regimen) -Daily PT/INR -Monitor for bleeding  Thank you for allowing me to take part in this patient's care,  Abran Duke, PharmD Clinical Pharmacist Phone: [phone] Pager: [phone] 06/17/2013 10:17 AM

## 2013-06-17 NOTE — H&P (View-Only) (Signed)
    [address]       [place] [address]             [phone]     CARDIOTHORACIC SURGERY PROGRESS NOTE  Subjective: Feels somewhat improved.  No specific complaints.  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  Breath sounds: clear  Heart sounds:  RRR w/ mechanical sounds  Incisions:  Healing nicely  Abdomen:  soft  Extremities:  Warm, no edema   Intake/Output from previous day: 09/21 0701 - 09/22 0700 In: 303 [P.O.:300; I.V.:3] Out: 427 [Urine:425; Emesis/NG output:1; Stool:1] Intake/Output this shift:    Lab Results:  Recent Labs  06/16/13 0550 06/16/13 1115  WBC 9.1 9.3  HGB 9.9* 10.7*  HCT 29.6* 31.7*  PLT 505* 498*   BMET:  Recent Labs  06/15/13 1430 06/16/13 0550  NA 138 135  K 5.0 3.7  CL 101 103  CO2 23 21  GLUCOSE 77 92  BUN 8 8  CREATININE 1.00 0.96  CALCIUM 9.4 9.2    CBG (last 3)  No results found for this basename: GLUCAP,  in the last 72 hours PT/INR:   Recent Labs  06/17/13 0438  LABPROT 30.9*  INR 3.11*    CXR:  CLINICAL DATA: Dizziness and cough  EXAM:  CHEST 2 VIEW  COMPARISON: June 10, 2013  FINDINGS:  The lungs are clear. Heart size and pulmonary vascularity are  normal. No adenopathy. There are mitral and tricuspid valve  replacements. No bony lesions.  IMPRESSION:  Prosthetic mitral and tricuspid valves. No edema or consolidation.  Electronically Signed  By: William Woodruff  On: 06/15/2013 15:14   Assessment/Plan:  Patient is well-known to me from her recent consultation and  subsequent surgery on 05/23/2013 for minimally invasive mitral valve replacement, tricuspid valve repair, and repair of incarcerated right femoral hernia.  Patient was last seen in our office one week ago at which time she was progressing nicely. She is been readmitted to the hospital over the weekend with vague complaints of malaise, fatigue, low-grade fever, and dizziness. She apparently had some diarrhea as well. Symptoms have reportedly improved with IV hydration. She has not had any fevers in the hospital and all lab work remains negative. She remains in first degree AV block with stable heart rate. During her early postoperative recovery she had complete heart block which gradually resolved. Immediately prior to hospital discharge she remained primarily in long first degree AV block with occasional brief periods of second-degree AV block.  The EKG which was scanned in to [facility] on 06/05/2013 was actually performed preoperatively on 05/20/2013. The first degree AV block which she has at this time is not new. Clinically there are no signs nor symptoms suggestive of prosthetic valve endocarditis. I absolutely agree with plans to proceed with echocardiogram to reevaluate her heart valves and make sure that she doesn't have a significant pericardial effusion.  I'm concerned by the fact that the patient's toxicology screen was positive for THC at the time of   admission.  Will continue to follow.  OWEN,CLARENCE H 06/17/2013 10:15 AM   

## 2013-06-17 NOTE — Progress Notes (Signed)
[address]       [name] [address]             [phone]     CARDIOTHORACIC SURGERY PROGRESS NOTE  Subjective: Feels somewhat improved.  No specific complaints.  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  Breath sounds: clear  Heart sounds:  RRR w/ mechanical sounds  Incisions:  Healing nicely  Abdomen:  soft  Extremities:  Warm, no edema   Intake/Output from previous day: 09/21 0701 - 09/22 0700 In: 303 [P.O.:300; I.V.:3] Out: 427 [Urine:425; Emesis/NG output:1; Stool:1] Intake/Output this shift:    Lab Results:  Recent Labs  06/16/13 0550 06/16/13 1115  WBC 9.1 9.3  HGB 9.9* 10.7*  HCT 29.6* 31.7*  PLT 505* 498*   BMET:  Recent Labs  06/15/13 1430 06/16/13 0550  NA 138 135  K 5.0 3.7  CL 101 103  CO2 23 21  GLUCOSE 77 92  BUN 8 8  CREATININE 1.00 0.96  CALCIUM 9.4 9.2    CBG (last 3)  No results found for this basename: GLUCAP,  in the last 72 hours PT/INR:   Recent Labs  06/17/13 0438  LABPROT 30.9*  INR 3.11*    CXR:  CLINICAL DATA: Dizziness and cough  EXAM:  CHEST 2 VIEW  COMPARISON: June 10, 2013  FINDINGS:  The lungs are clear. Heart size and pulmonary vascularity are  normal. No adenopathy. There are mitral and tricuspid valve  replacements. No bony lesions.  IMPRESSION:  Prosthetic mitral and tricuspid valves. No edema or consolidation.  Electronically Signed  By: Bretta Bang  On: 06/15/2013 15:14   Assessment/Plan:  Patient is well-known to me from her recent consultation and  subsequent surgery on 05/23/2013 for minimally invasive mitral valve replacement, tricuspid valve repair, and repair of incarcerated right femoral hernia.  Patient was last seen in our office one week ago at which time she was progressing nicely. She is been readmitted to the hospital over the weekend with vague complaints of malaise, fatigue, low-grade fever, and dizziness. She apparently had some diarrhea as well. Symptoms have reportedly improved with IV hydration. She has not had any fevers in the hospital and all lab work remains negative. She remains in first degree AV block with stable heart rate. During her early postoperative recovery she had complete heart block which gradually resolved. Immediately prior to hospital discharge she remained primarily in long first degree AV block with occasional brief periods of second-degree AV block.  The EKG which was scanned in to [facility] on 06/05/2013 was actually performed preoperatively on 05/20/2013. The first degree AV block which she has at this time is not new. Clinically there are no signs nor symptoms suggestive of prosthetic valve endocarditis. I absolutely agree with plans to proceed with echocardiogram to reevaluate her heart valves and make sure that she doesn't have a significant pericardial effusion.  I'm concerned by the fact that the patient's toxicology screen was positive for THC at the time of  admission.  Will continue to follow.  Clarise Chacko H 06/17/2013 10:15 AM

## 2013-06-17 NOTE — Progress Notes (Signed)
Family Medicine Teaching Service Daily Progress Note Intern Pager: [phone]  Patient name: Mary [name] Medical record number: [id number] Date of birth: September 17, 1961 Age: 52 y.o. Gender: female  Primary Care Provider: No PCP Per Patient Consultants: [facility] Code Status: Full  Pt Overview and Major Events to Date:  8/28: mitral valve replacement and tricuspid valve repair surgery  9/20: Presyncope; BNP ~ 900 9/21: CT surg & cardiology: recs ECHO and EP c/s for 1st degree AV block  Assessment and Plan: Mary [name] is a 52 y.o. female presenting with presyncope. 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   # Presyncope - DDx includes dehydration (orthostatics negative after 1L bolus in ED), thyroid dysfunction, anemia (hgb stable @11 .4), vasovagal and psychogenic; Less likely infectious (Tmax 100 WBC wnl, CXR no consolidation, procalcitonin and lactic acid wnl) but considering endocarditis given recent cardiac instrumentation, neurological (No CN deficits or unilateral weakness), electrolyte abnormality/uremia (BMET wnl). TEE during surgery: Systolic function was normal. EF 60% Wall motion was normal  - ProBNP >900 but decreased from previous 5900 (7/21 prior to recent cardiac surgery)  - Daily wts 93.8 lbs down ~ 4 lbs from Admit 97.7 and Strict I&O (0.4 cc/kg/hr) - TSH wnl & UDS (+ THC) - Cylce trop's (Neg x 3) & repeat EKG 9/21: Sinus rhythm with 1st degree A-V block; No acute ST changes  - BMP 9/21 (GFR 68 otherwise wnl) - CBC: Hgb 10.7 (9/21) > 9.9 < 11.4 (9/20); Plts 498 (9/21) < 505 < 595 (9/20)  - PT/OT : No Needs at this time  # Recent Cardiac Surgery w reported fevers at home (Tmax 100) - mitral valve replacement, tricuspid valve repair, and repair of right femoral hernia 05/23/13 on coumadin - seen by Dr. Joycelyn Man 9/15 with no issues. Afebrile on  admission. CXR, CBC, lipase, procalcitonin, and lactic acid WNL - ED ordered:  UA (Hgb small, many Bacteria, otherwise neg) and blood culture: NG x 1 day (drawn 9/20 @ 6pm) - Thrombocytosis decreasing: 505 (9/21) < 593 (9/20) - INR 3.11 (9/21) goal: 2.5-3.5  - Coumadin per pharmacy  - ASA 81mg   - [facility]: Recommend EP due to 1st degree block - Cardiology Recs: ECHO (ordered 9/21), f/u BCx, continue tele - EP should see today (9/22)  FEN/GI:  Diet: Heart Healthy  Saline Lock  Prophylaxis: On Warfarin   Disposition: On telemetry; D/c home pending PT/OT evaluation and Cardiac surgeon evaluation  Subjective: Reports feeling better today, but "still not herself". She feels like she is getting stronger, and has been able to walk around without dizziness, CP, SOB, or weakness. She is a little concerned about her wt loss, but reports she is eating and drinking well.   Objective: Temp:  [98.2 F (36.8 C)-99.3 F (37.4 C)] 98.2 F (36.8 C) (09/22 0444) Pulse Rate:  [85-99] 99 (09/22 0444) Resp:  [16] 16 (09/22 0444) BP: (101-124)/(61-67) 101/61 mmHg (09/22 0444) SpO2:  [100 %] 100 % (09/22 0444) Weight:  [93 lb 12.8 oz (42.547 kg)] 93 lb 12.8 oz (42.547 kg) (09/22 0641)  Physical Exam:  Gen: Thin frail female in NAD  Neuro: A&Ox4 HEENT: MMM  CV: RRR, No JVD; Pulses +2 b/l; CR < 3 secs  Lungs: CTAB; decreased breath sounds b/l  Lower Ext: No skin changes; No edema; distal pulses intact; Calves nontender  Laboratory: Results for orders placed during the hospital encounter  of 06/15/13 (from the past 24 hour(s))  URINALYSIS, ROUTINE W REFLEX MICROSCOPIC     Status: Abnormal   Collection Time    06/16/13 11:00 AM      Result Value Range   Color, Urine AMBER (*) YELLOW   APPearance CLEAR  CLEAR   Specific Gravity, Urine 1.028  1.005 - 1.030   pH 7.0  5.0 - 8.0   Glucose, UA NEGATIVE  NEGATIVE mg/dL   Hgb urine dipstick NEGATIVE  NEGATIVE   Bilirubin Urine SMALL (*)  NEGATIVE   Ketones, ur 40 (*) NEGATIVE mg/dL   Protein, ur 30 (*) NEGATIVE mg/dL   Urobilinogen, UA 1.0  0.0 - 1.0 mg/dL   Nitrite NEGATIVE  NEGATIVE   Leukocytes, UA NEGATIVE  NEGATIVE  URINE RAPID DRUG SCREEN (HOSP PERFORMED)     Status: Abnormal   Collection Time    06/16/13 11:00 AM      Result Value Range   Opiates NONE DETECTED  NONE DETECTED   Cocaine NONE DETECTED  NONE DETECTED   Benzodiazepines NONE DETECTED  NONE DETECTED   Amphetamines NONE DETECTED  NONE DETECTED   Tetrahydrocannabinol POSITIVE (*) NONE DETECTED   Barbiturates NONE DETECTED  NONE DETECTED  TROPONIN I     Status: None   Collection Time    06/16/13 11:15 AM      Result Value Range   Troponin I <0.30  <0.30 ng/mL  CBC     Status: Abnormal   Collection Time    06/16/13 11:15 AM      Result Value Range   WBC 9.3  4.0 - 10.5 K/uL   RBC 4.00  3.87 - 5.11 MIL/uL   Hemoglobin 10.7 (*) 12.0 - 15.0 g/dL   HCT 16.1 (*) 09.6 - 04.5 %   MCV 79.3  78.0 - 100.0 fL   MCH 26.8  26.0 - 34.0 pg   MCHC 33.8  30.0 - 36.0 g/dL   RDW 40.9 (*) 81.1 - 91.4 %   Platelets 498 (*) 150 - 400 K/uL  URINALYSIS, ROUTINE W REFLEX MICROSCOPIC     Status: Abnormal   Collection Time    06/16/13  9:08 PM      Result Value Range   Color, Urine YELLOW  YELLOW   APPearance CLEAR  CLEAR   Specific Gravity, Urine 1.018  1.005 - 1.030   pH 7.5  5.0 - 8.0   Glucose, UA NEGATIVE  NEGATIVE mg/dL   Hgb urine dipstick SMALL (*) NEGATIVE   Bilirubin Urine NEGATIVE  NEGATIVE   Ketones, ur NEGATIVE  NEGATIVE mg/dL   Protein, ur NEGATIVE  NEGATIVE mg/dL   Urobilinogen, UA 0.2  0.0 - 1.0 mg/dL   Nitrite NEGATIVE  NEGATIVE   Leukocytes, UA NEGATIVE  NEGATIVE  URINE MICROSCOPIC-ADD ON     Status: Abnormal   Collection Time    06/16/13  9:08 PM      Result Value Range   Squamous Epithelial / LPF MANY (*) RARE   WBC, UA 0-2  <3 WBC/hpf   RBC / HPF 3-6  <3 RBC/hpf   Bacteria, UA MANY (*) RARE  URINE RAPID DRUG SCREEN (HOSP PERFORMED)      Status: Abnormal   Collection Time    06/16/13  9:09 PM      Result Value Range   Opiates NONE DETECTED  NONE DETECTED   Cocaine NONE DETECTED  NONE DETECTED   Benzodiazepines NONE DETECTED  NONE DETECTED   Amphetamines NONE DETECTED  NONE DETECTED   Tetrahydrocannabinol POSITIVE (*) NONE DETECTED   Barbiturates NONE DETECTED  NONE DETECTED  PROTIME-INR     Status: Abnormal   Collection Time    06/17/13  4:38 AM      Result Value Range   Prothrombin Time 30.9 (*) 11.6 - 15.2 seconds   INR 3.11 (*) 0.00 - 1.49   Imaging/Diagnostic Tests: CXR IMPRESSION: Prosthetic mitral and tricuspid valves. No edema or consolidation.  Wenda Low, MD 06/17/2013, 7:02 AM PGY-1, [facility] Family Medicine FPTS Intern pager: [phone], text pages welcome

## 2013-06-18 ENCOUNTER — Telehealth: Payer: Self-pay

## 2013-06-18 ENCOUNTER — Encounter (HOSPITAL_COMMUNITY): Payer: Self-pay | Admitting: Cardiology

## 2013-06-18 DIAGNOSIS — R5383 Other fatigue: Secondary | ICD-10-CM

## 2013-06-18 DIAGNOSIS — Z954 Presence of other heart-valve replacement: Secondary | ICD-10-CM

## 2013-06-18 DIAGNOSIS — R42 Dizziness and giddiness: Secondary | ICD-10-CM

## 2013-06-18 DIAGNOSIS — R55 Syncope and collapse: Secondary | ICD-10-CM

## 2013-06-18 DIAGNOSIS — R5381 Other malaise: Secondary | ICD-10-CM

## 2013-06-18 DIAGNOSIS — I509 Heart failure, unspecified: Secondary | ICD-10-CM

## 2013-06-18 LAB — PROTIME-INR: INR: 2.69 — ABNORMAL HIGH (ref 0.00–1.49)

## 2013-06-18 NOTE — Telephone Encounter (Signed)
Received call from scheduler [facility] requesting order for cardiac event monitor.Patient being discharged from hospital today.Order for 30 day event monitor placed in chart.

## 2013-06-18 NOTE — Progress Notes (Signed)
Attending Addendum  I examined the patient and discussed the assessment and plan with Dr. Joyner. I have reviewed the note and agree.    Zyiere Rosemond, MD FAMILY MEDICINE TEACHING SERVICE    

## 2013-06-18 NOTE — Progress Notes (Signed)
Patient ID: Mary [name], female   DOB: [birth year], 52 y.o.   MRN: [id number] Advanced Heart Failure Rounding Note   Subjective:    52 yo woman who had a mitral valve replacement (mechanical) and tricuspid valve repair on 8/28. Post-op course c/b CHB and seen by EP. Resolved spontaneously. Discharged on 9/9. PR interval prolonged at time of discharge.   Was seen in the office 9/15 by Dr. Dorris Fetch at which time she making slow but steady progress. She says she began having low grade fevers and diarrhea several days ago. She was readmitted after a presyncopal episode. She received IVF in ED before orthostatics were done.  She does feel better since she was rehydrated and readmitted. Afebrile overnight and no dizziness.   ECG with long 1AVB ( ).  She had a TEE yesterday with no evidence for endocarditis.    Objective:   Weight Range:  Vital Signs:   Temp:  [98.4 F (36.9 C)-99.1 F (37.3 C)] 98.6 F (37 C) (09/23 0500) Pulse Rate:  [74-102] 83 (09/23 0500) Resp:  [13-23] 16 (09/23 0500) BP: (89-130)/(58-84) 112/70 mmHg (09/23 0500) SpO2:  [99 %-100 %] 100 % (09/23 0500) Weight:  [42.7 kg (94 lb 2.2 oz)] 42.7 kg (94 lb 2.2 oz) (09/23 0500) Last BM Date: 06/16/13  Weight change: Filed Weights   06/16/13 0554 06/17/13 0641 06/18/13 0500  Weight: 44.316 kg (97 lb 11.2 oz) 42.547 kg (93 lb 12.8 oz) 42.7 kg (94 lb 2.2 oz)    Intake/Output:   Intake/Output Summary (Last 24 hours) at 06/18/13 0800 Last data filed at 06/17/13 1825  Gross per 24 hour  Intake    240 ml  Output      0 ml  Net    240 ml     Physical Exam: Sitting in chair. in no acute distress  HEENT: normal x for poor dentition Neck: No JVD. Carotids 2+ bilaterally Lungs: clear to auscultation bilaterally  Heart: RRR. Mechanical s1. No murmur Abdomen: soft, non-tender; bowel sounds normal; no masses, no organomegaly  Extremities: extremities normal, atraumatic, no cyanosis or edema. No rash Pulses: 2+  and symmetric  Neurologic: Grossly normal   Telemetry: Stach. 1AVB. No pauses.  Labs: Basic Metabolic Panel:  Recent Labs Lab 06/15/13 1430 06/16/13 0550  NA 138 135  K 5.0 3.7  CL 101 103  CO2 23 21  GLUCOSE 77 92  BUN 8 8  CREATININE 1.00 0.96  CALCIUM 9.4 9.2    Liver Function Tests:  Recent Labs Lab 06/15/13 1430  AST 22  ALT 10  ALKPHOS 87  BILITOT 0.3  PROT 7.8  ALBUMIN 3.7    Recent Labs Lab 06/15/13 1430  LIPASE 34   No results found for this basename: AMMONIA,  in the last 168 hours  CBC:  Recent Labs Lab 06/15/13 1430 06/16/13 0550 06/16/13 1115  WBC 8.8 9.1 9.3  NEUTROABS 6.7  --   --   HGB 11.4* 9.9* 10.7*  HCT 32.6* 29.6* 31.7*  MCV 78.6 79.1 79.3  PLT 593* 505* 498*    Cardiac Enzymes:  Recent Labs Lab 06/16/13 0100 06/16/13 0525 06/16/13 1115  TROPONINI <0.30 <0.30 <0.30    BNP: BNP (last 3 results)  Recent Labs  03/15/13 2023 04/15/13 1237 06/15/13 1430  PROBNP 4536.0* 5910.0* 908.3*     Other results:  EKG: ST 106. RSR'  1avb 270 ms +prolonged QT.   Imaging: No results found.   Medications:     Scheduled Medications: .  aspirin  81 mg Oral Daily  . sodium chloride  1,000 mL Intravenous Once  . sodium chloride  3 mL Intravenous Q12H  . Warfarin - Pharmacist Dosing Inpatient   Does not apply q1800    Infusions: . sodium chloride 20 mL (06/17/13 1744)    PRN Medications: acetaminophen, ondansetron (ZOFRAN) IV, ondansetron   Assessment:   1. Presyncope 2. 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 Post-op CHB now with long 1AVB 4. Tobacco use 5. Anemia   Plan/Discussion:    TEE yesterday did not show endocarditis.  MVR and TV repair were stable.  There was an echodensity in the RA that could have been post-op layered thrombus but treatment is going to be ongoing anticoagulation (continue ASA 81 and coumadin with INR goal  2.5-3.5).   Telemetry reviewed: no overnight events.   It is possible that presyncope was due to viral gastroenteritis and dehydration.  She is doing much better and can probably go home.  However, would arrange for 30 day event monitor given her baseline prolonged PR interval to make sure that she does not have episodes of intermittent high grade heart block that could lead to presyncope.   Mary [name] 06/18/2013 8:04 AM

## 2013-06-18 NOTE — Progress Notes (Signed)
Patient is ready for DC home, currently waiting on mother. Patient reports she understands DC instructions as well as follow up appointments.

## 2013-06-18 NOTE — Progress Notes (Signed)
  Echocardiogram 2D Echocardiogram has been performed.  Arvil Chaco 06/18/2013, 1:29 PM

## 2013-06-18 NOTE — Progress Notes (Signed)
TCTS BRIEF PROGRESS NOTE   Ms Mary [name] reports feeling well and appears to be ready for d/c home TEE looks good.  Transthoracic ECHO just performed (? reason) Ms Mary [name] already has an appt to see me in the office in a few weeks  OWEN,CLARENCE H 06/18/2013 1:45 PM

## 2013-06-18 NOTE — Progress Notes (Signed)
Family Medicine Teaching Service Daily Progress Note Intern Pager: [phone]  Patient name: Mary [name] Medical record number: [id number] Date of birth: Mar 04, 1961 Age: 52 y.o. Gender: female  Primary Care Provider: No PCP Per Patient Consultants: [facility] Code Status: Full  Pt Overview and Major Events to Date:  8/28: mitral valve replacement and tricuspid valve repair surgery  9/20: Presyncope; BNP ~ 900 9/21: CT surg & cardiology: recs ECHO and EP c/s for 1st degree AV block  Assessment and Plan: Mary [name] is a 52 y.o. female presenting with presyncope. 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   # Presyncope - DDx includes dehydration (orthostatics negative after 1L bolus in ED), thyroid dysfunction, anemia (hgb stable @11 .4), vasovagal and psychogenic; Less likely infectious (Tmax 100 WBC wnl, CXR no consolidation, procalcitonin and lactic acid wnl) but considering endocarditis given recent cardiac instrumentation, neurological (No CN deficits or unilateral weakness), electrolyte abnormality/uremia (BMET wnl). TEE during surgery: Systolic function was normal. EF 60% Wall motion was normal  - ProBNP >900 but decreased from previous 5900 (7/21 prior to recent cardiac surgery)  - Daily wts 94.12 > 93.8 lbs (Admit 97.7) and Strict I&O not recorded - TSH wnl & UDS (+ THC) - Cylce trop's (Neg x 3) & repeat EKG 9/21: Sinus rhythm with 1st degree A-V block; No acute ST changes  - BMP 9/21 (GFR 68 otherwise wnl) - CBC: Hgb 10.7 (9/21) > 9.9 < 11.4 (9/20); Plts 498 (9/21) < 505 < 595 (9/20)  - PT/OT : No Needs at this time  # Recent Cardiac Surgery w reported fevers at home (Tmax 100) - mitral valve replacement, tricuspid valve repair, and repair of right femoral hernia 05/23/13 on coumadin - seen by Dr. Joycelyn Man 9/15 with no issues. Afebrile on admission.  CXR, CBC, lipase, procalcitonin, and lactic acid WNL - ED ordered:  UA (Hgb small, many Bacteria, otherwise neg) and blood culture: NG x 2 day (drawn 9/20 @ 6pm) - Thrombocytosis decreasing: 505 (9/21) < 593 (9/20) - INR 2.69 (9/21) goal: 2.5-3.5  - Coumadin per pharmacy  - ASA 81mg   - [facility]: Recommend EP due to 1st degree block - Cardiology Recs: ECHO (ordered 9/21), f/u BCx, continue tele - EP c/s recommended (9/21) - ECHO: No evidence of vegetation;   55% -60%. Wall motion was normal - Tele: 1st degree heart block  FEN/GI:  Diet: Heart Healthy  Saline Lock  Prophylaxis: On Warfarin   Disposition: On telemetry; D/c home pending EP evaluation  Subjective: No complaints this morning. Denies dizziness, CP, SOB, or weakness.   Objective: Temp:  [98.4 F (36.9 C)-99.1 F (37.3 C)] 98.6 F (37 C) (09/23 0500) Pulse Rate:  [74-102] 83 (09/23 0500) Resp:  [13-23] 16 (09/23 0500) BP: (89-130)/(58-84) 112/70 mmHg (09/23 0500) SpO2:  [99 %-100 %] 100 % (09/23 0500) Weight:  [94 lb 2.2 oz (42.7 kg)] 94 lb 2.2 oz (42.7 kg) (09/23 0500)  Physical Exam: Gen: Thin frail female in NAD  Neuro: A&Ox4 HEENT: MMM  CV: RRR, No JVD; Pulses +2 b/l; CR < 3 secs  Lungs: CTAB; decreased breath sounds b/l  Lower Ext: No skin changes; No edema; distal pulses intact; Calves nontender  Laboratory: Results for orders placed during the hospital encounter of 06/15/13 (from the past 24 hour(s))  PROTIME-INR     Status: Abnormal   Collection Time    06/18/13  5:25 AM      Result Value Range   Prothrombin Time 27.7 (*) 11.6 - 15.2 seconds   INR 2.69 (*) 0.00 - 1.49   Imaging/Diagnostic Tests: CXR IMPRESSION: Prosthetic mitral and tricuspid valves. No edema or consolidation.  TEE - S/P MVR (St Jude valve) with mild MR; small oscillating density on medial anulus most likely represents suture; s/p TV repair with mild MR; echodensity in right atrium of uncertain etiology (? layered  thrombus from recent surgery); no obvious vegetations.  Wenda Low, MD 06/18/2013, 7:09 AM PGY-1, [facility] Family Medicine FPTS Intern pager: [phone], text pages welcome

## 2013-06-19 NOTE — ED Provider Notes (Signed)
Medical screening examination/treatment/procedure(s) were performed by non-physician practitioner and as supervising physician I was immediately available for consultation/collaboration.   Roney Marion, MD 06/19/13 1447

## 2013-06-19 NOTE — Discharge Summary (Signed)
Attending Addendum  I examined the patient and discussed the assessment and plan with Dr. Joyner. I have reviewed the note and agree.    Babe Clenney, MD FAMILY MEDICINE TEACHING SERVICE    

## 2013-06-22 LAB — CULTURE, BLOOD (ROUTINE X 2)
Culture: NO GROWTH
Culture: NO GROWTH

## 2013-06-24 ENCOUNTER — Encounter: Payer: Self-pay | Admitting: Physician Assistant

## 2013-06-24 ENCOUNTER — Encounter (INDEPENDENT_AMBULATORY_CARE_PROVIDER_SITE_OTHER): Payer: Medicaid Other

## 2013-06-24 ENCOUNTER — Ambulatory Visit (INDEPENDENT_AMBULATORY_CARE_PROVIDER_SITE_OTHER): Payer: Medicaid Other | Admitting: *Deleted

## 2013-06-24 ENCOUNTER — Ambulatory Visit (INDEPENDENT_AMBULATORY_CARE_PROVIDER_SITE_OTHER): Payer: Medicaid Other | Admitting: Physician Assistant

## 2013-06-24 ENCOUNTER — Encounter: Payer: Self-pay | Admitting: *Deleted

## 2013-06-24 VITALS — BP 104/64 | HR 97 | Ht 62.0 in | Wt 108.0 lb

## 2013-06-24 DIAGNOSIS — Z954 Presence of other heart-valve replacement: Secondary | ICD-10-CM

## 2013-06-24 DIAGNOSIS — R55 Syncope and collapse: Secondary | ICD-10-CM

## 2013-06-24 DIAGNOSIS — Z7901 Long term (current) use of anticoagulants: Secondary | ICD-10-CM

## 2013-06-24 DIAGNOSIS — I442 Atrioventricular block, complete: Secondary | ICD-10-CM

## 2013-06-24 DIAGNOSIS — I471 Supraventricular tachycardia: Secondary | ICD-10-CM

## 2013-06-24 DIAGNOSIS — I509 Heart failure, unspecified: Secondary | ICD-10-CM

## 2013-06-24 DIAGNOSIS — I440 Atrioventricular block, first degree: Secondary | ICD-10-CM

## 2013-06-24 DIAGNOSIS — I059 Rheumatic mitral valve disease, unspecified: Secondary | ICD-10-CM

## 2013-06-24 DIAGNOSIS — I498 Other specified cardiac arrhythmias: Secondary | ICD-10-CM

## 2013-06-24 DIAGNOSIS — I5030 Unspecified diastolic (congestive) heart failure: Secondary | ICD-10-CM

## 2013-06-24 DIAGNOSIS — R42 Dizziness and giddiness: Secondary | ICD-10-CM

## 2013-06-24 NOTE — Assessment & Plan Note (Signed)
Patient is doing extremely well status post mitral valve replacement and tricuspid valve repair. She is feeling well and denies cardiac complaints. She is scheduled to have an event recorder placed today to followup her first degree AV block and Mobitz 1 block. She was advised weight by Dr. Johney Frame for possible pacemaker placement. She will followup with him in one month.

## 2013-06-24 NOTE — Assessment & Plan Note (Signed)
Ejection fraction 55-60%. No evidence of heart failure on exam today

## 2013-06-24 NOTE — Patient Instructions (Addendum)
Your physician recommends that you schedule a follow-up appointment in: 1 MONTH WITH DR. ALLRED (POSSIBLE PACEMAKER//30 DAY MONITOR F/U)  Your physician recommends that you schedule a follow-up appointment in: 2 MONTHS WITH DR. [facility]  Your physician recommends that you continue on your current medications as directed. Please refer to the Current Medication list given to you today.

## 2013-06-24 NOTE — Assessment & Plan Note (Signed)
No further evidence of complete heart block. She is scheduled to have an event recorder placed today to followup her first degree AV block and Mobitz 1 block. She was advised weight by Dr. Johney Frame for possible pacemaker placement. She will followup with him in one month.

## 2013-06-24 NOTE — Assessment & Plan Note (Signed)
She is scheduled to have an event recorder placed today to followup her first degree AV block and Mobitz 1 block. She was advised weight by Dr. Johney Frame for possible pacemaker placement. She will followup with him in one month.

## 2013-06-24 NOTE — Assessment & Plan Note (Signed)
INR checked today.

## 2013-06-24 NOTE — Progress Notes (Signed)
Patient ID: Mary [name], female   DOB: 1961/08/18, 52 y.o.   MRN: [id number] [employer] 30 day cardiac event monitor applied to patient.

## 2013-06-24 NOTE — Progress Notes (Signed)
HPI:  This is a 52 year old female patient who had SBE and severe MR in June 2014. She was treated with antibiotics and then underwent mitral valve replacement and tricuspid valve repair on 05/23/13. She was readmitted on 06/17/13 with presyncope. TEE was performed and showed prosthetic mitral valve with mild MR, small oscillating density on the medial annulus most likely represent suture, status post tricuspid valve repair with mild TR, echodensity in the right atrium of uncertain etiology possibly layered thrombus from recent surgery, no vegetations. Her EKG showed first degree AV block similar to after surgery. She was scheduled to receive a loop monitor in followup. She has normal LV function ejection fraction 55%.   The patient comes in today posthospitalization. She looks fantastic. She denies any chest pain, palpitations, dyspnea, dyspnea on exertion, dizziness, or presyncope. 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 She noticed a little swelling below her right groin incision site where her hernia was repaired but this has resolved. She is to have an event recorder placed today and her INR checked.   Allergies: -- Oxycodone -- Itching  Current Outpatient Prescriptions on File Prior to Visit: aspirin 81 MG chewable tablet, Chew 1 tablet (81 mg total) by mouth daily., Disp: , Rfl:  warfarin (COUMADIN) 2.5 MG tablet, Take 5 mg by mouth daily. Take three (3) tablets 7.5 mg total on Monday, Wednesday, and Fridays at 6 PM. Two (2) tablets 5 mg on other days., Disp: , Rfl:   No current facility-administered medications on file prior to visit.   Past Medical History:   Asthma                                                         Comment:"as a baby"    Anemia                                                       History of blood transfusion                                   Comment:"14 w/1st pregnancy; 2 w/last C-section"               (03/15/2013)  GERD (gastroesophageal reflux disease)                       Hypertension                                                   Comment:no pcp   will go to [facility]   Severe mitral regurgitation                     04/22/2013    SVT (supraventricular tachycardia)              03/16/2013    Acute diastolic heart failure  03/15/2013    Rheumatic mitral stenosis with regurgitation    03/23/2013    Tricuspid regurgitation                                      S/P mitral valve replacement with metallic val* 05/23/2013      Comment:23mm Sorin Carbomedics mechanical prosthesis               via right mini thoracotomy approach   S/P tricuspid valve repair                      05/23/2013      Comment:Complex valvuloplasty including Cor-matrix ECM               patch augmentation of anterior and lateral               leaflets with 26mm Edwards mc3 ring               annuloplasty via right mini-thoracotomy               approach   Complete heart block                            05/28/2013       Comment:Post-op  Past Surgical History:   APPENDECTOMY                                     ~1978        CESAREAN SECTION                                 1983; 1999   CHOLECYSTECTOMY                                  2000's       TUBAL LIGATION                                   1999         TEE WITHOUT CARDIOVERSION                       N/A 03/18/2013      Comment:Procedure: TRANSESOPHAGEAL ECHOCARDIOGRAM               (TEE);  Surgeon: Laurey Morale, MD;                Location: [facility] ENDOSCOPY;  Service:               Cardiovascular;  Laterality: N/A;   CARDIAC CATHETERIZATION                                       MULTIPLE EXTRACTIONS WITH ALVEOLOPLASTY         N/A 04/04/2013      Comment:Procedure: Extraction of tooth #'s               1,8,9,13,14,15,23,24,25,26 with alveoloplasty  and gross debridement of remaining teeth;                Surgeon: Charlynne Pander, DDS;  Location: [facility]                OR;  Service: Oral Surgery;  Laterality: N/A;   MINIMALLY INVASIVE TRICUSPID VALVE REPAIR       Right 05/23/2013      Comment:Procedure: MINIMALLY INVASIVE TRICUSPID VALVE               REPAIR;  Surgeon: Purcell Nails, MD;                Location: [facility] OR;  Service: Open Heart Surgery;               Laterality: Right;   INTRAOPERATIVE TRANSESOPHAGEAL ECHOCARDIOGRAM   N/A 05/23/2013      Comment:Procedure: INTRAOPERATIVE TRANSESOPHAGEAL               ECHOCARDIOGRAM;  Surgeon: Purcell Nails, MD;               Location: [facility] OR;  Service: Open Heart Surgery;               Laterality: N/A;   FEMORAL HERNIA REPAIR                           Right 05/23/2013      Comment:Procedure: HERNIA REPAIR FEMORAL;  Surgeon:               Purcell Nails, MD;  Location: [facility] OR;                Service: Open Heart Surgery;  Laterality:               Right;   MITRAL VALVE REPLACEMENT                        N/A 05/23/2013      Comment:Procedure: MITRAL VALVE (MV) REPLACEMENT;                Surgeon: Purcell Nails, MD;  Location: [facility] OR;              Service: Open Heart Surgery;  Laterality: N/A;   TEE WITHOUT CARDIOVERSION                       N/A 06/17/2013      Comment:Procedure: TRANSESOPHAGEAL ECHOCARDIOGRAM               (TEE);  Surgeon: Lewayne Bunting, MD;                Location: [facility] ENDOSCOPY;  Service:               Cardiovascular;  Laterality: N/A;  No family history on file.   Social History   Marital Status: Single              Spouse Name:                      Years of Education:                 Number of children:             Occupational History   None on file  Social History Main Topics   Smoking Status: Current Every Day Smoker  Packs/Day: 0.50  Years: 12        Types: Cigarettes     Last Attempt to quit: 03/15/2013   Smokeless Status: Never Used                       Alcohol Use: Yes           3.6 oz/week      6 Glasses of wine per week      Comment: 03/15/2013 "bottle  of wine/wk"   Drug Use: No             Sexual Activity: No                 Other Topics            Concern   None on file  Social History Narrative   None on file    ROS: See history of present illness otherwise negative   PHYSICAL EXAM: Well-nournished, in no acute distress. Neck: No JVD, HJR, Bruit, or thyroid enlargement  Lungs: No tachypnea, clear without wheezing, rales, or rhonchi  Cardiovascular: Incisions healing well, RRR, PMI not displaced, crisp heart valve sounds, 1/6 systolic murmur at the left sternal border, no gallops, bruit, thrill, or heave.  Abdomen: BS normal. Soft without organomegaly, masses, lesions or tenderness.  Extremities: Right groin incision site healing well no evidence of swelling or hematoma, lower extremities without cyanosis, clubbing or edema. Good distal pulses bilateral  SKin: Warm, no lesions or rashes   Musculoskeletal: No deformities  Neuro: no focal signs  LMP 01/28/2013   EKG: Normal sinus rhythm with first degree AV block prolonged QT, QTC 482 ms  Date of Procedure:               05/23/2013  Preoperative Diagnosis:       Severe Mitral Regurgitation  Mild Mitral Stenosis  Postoperative Diagnosis:  Severe Mitral Regurgitation  Mild Mitral Stenosis  Moderate-severe Tricuspid Regurgitation  Incarcerated Right Femoral Hernia  Procedure:         Minimally-Invasive Mitral Valve Replacement             Sorin Carbomedics Optiform mechanical prosthesis (size 33mm, catalog #[id number], serial #[id number])     Minimally-Invasive Tricuspid Valve Repair             Complex valvuloplasty including Cormatrix ECM patch augmentation of anterior and lateral leaflets             Edwards mc3 ring annuloplasty (size 26mm, model #4900, serial #[id number])     Primary Repair of Right Femoral Hernia               Surgeon:        Salvatore Decent. Cornelius Moras, MD   TEE:06/17/13: Study Conclusions  - Left ventricle: Systolic function was normal.  The   estimated ejection fraction was in the range of 55% to   60%. Wall motion was normal; there were no regional wall   motion abnormalities. - Aortic valve: No evidence of vegetation. - Mitral valve: A mechanical prosthesis was present. Mild   regurgitation. - Left atrium: No evidence of thrombus in the atrial cavity   or appendage. - Atrial septum: No defect or patent foramen ovale was   identified. - Tricuspid valve: Prior procedures included surgical   repair. - Pulmonic valve: No evidence of vegetation. Impressions:  - S/P MVR (St Jude valve) with mild MR; small oscillating   density on medial anulus most likely represents  suture;   s/p TV repair with mild MR; echodensity in right atrium of   uncertain etiology (? layered thrombus from recent   surgery); no obvious vegetations   2Decho 06/18/13: Study Conclusions  - Left ventricle: The cavity size was normal. Systolic   function was normal. The estimated ejection fraction was   in the range of 55% to 60%. Wall motion was normal; there   were no regional wall motion abnormalities. - Mitral valve: No regurgitation is noted. Suggest TEE to   better evaluate as cannot visualize valve well enough to   exclude vegetation or abcess. A mechanical prosthesis was   present and functioning normally. - Left atrium: The atrium was mildly to moderately dilated.

## 2013-07-01 ENCOUNTER — Ambulatory Visit (INDEPENDENT_AMBULATORY_CARE_PROVIDER_SITE_OTHER): Payer: Medicaid Other | Admitting: *Deleted

## 2013-07-01 DIAGNOSIS — Z7901 Long term (current) use of anticoagulants: Secondary | ICD-10-CM

## 2013-07-01 DIAGNOSIS — I059 Rheumatic mitral valve disease, unspecified: Secondary | ICD-10-CM

## 2013-07-01 DIAGNOSIS — Z954 Presence of other heart-valve replacement: Secondary | ICD-10-CM

## 2013-07-08 ENCOUNTER — Encounter: Payer: Self-pay | Admitting: Thoracic Surgery (Cardiothoracic Vascular Surgery)

## 2013-07-08 ENCOUNTER — Ambulatory Visit (INDEPENDENT_AMBULATORY_CARE_PROVIDER_SITE_OTHER): Payer: Medicaid Other | Admitting: *Deleted

## 2013-07-08 ENCOUNTER — Ambulatory Visit (INDEPENDENT_AMBULATORY_CARE_PROVIDER_SITE_OTHER): Payer: Self-pay | Admitting: Thoracic Surgery (Cardiothoracic Vascular Surgery)

## 2013-07-08 ENCOUNTER — Encounter (INDEPENDENT_AMBULATORY_CARE_PROVIDER_SITE_OTHER): Payer: Self-pay

## 2013-07-08 VITALS — BP 117/75 | HR 106 | Resp 16 | Ht 62.0 in | Wt 119.5 lb

## 2013-07-08 DIAGNOSIS — I071 Rheumatic tricuspid insufficiency: Secondary | ICD-10-CM

## 2013-07-08 DIAGNOSIS — Z954 Presence of other heart-valve replacement: Secondary | ICD-10-CM

## 2013-07-08 DIAGNOSIS — I340 Nonrheumatic mitral (valve) insufficiency: Secondary | ICD-10-CM

## 2013-07-08 DIAGNOSIS — Z9889 Other specified postprocedural states: Secondary | ICD-10-CM

## 2013-07-08 DIAGNOSIS — Z952 Presence of prosthetic heart valve: Secondary | ICD-10-CM

## 2013-07-08 DIAGNOSIS — Z7901 Long term (current) use of anticoagulants: Secondary | ICD-10-CM

## 2013-07-08 DIAGNOSIS — I059 Rheumatic mitral valve disease, unspecified: Secondary | ICD-10-CM

## 2013-07-08 DIAGNOSIS — I079 Rheumatic tricuspid valve disease, unspecified: Secondary | ICD-10-CM

## 2013-07-08 MED ORDER — WARFARIN SODIUM 2.5 MG PO TABS: ORAL_TABLET | ORAL | Status: DC

## 2013-07-08 NOTE — Patient Instructions (Signed)
The patient may continue to gradually increase their physical activity as tolerated.  They should refrain from any heavy lifting or strenuous use of their arms and shoulders until at least 8 weeks from the time of their surgery, and they should avoid activities that cause increased pain in their chest on the side of their surgical incision.  Otherwise they may continue to increase their activities without any particular limitations.  The patient may return to driving an automobile as long as they are no longer requiring oral narcotic pain relievers during the daytime.  It would be wise to start driving only short distances during the daylight and gradually increase from there as they feel comfortable.   Endocarditis is a potentially serious infection of heart valves or inside lining of the heart.  It occurs more commonly in patients with diseased heart valves (such as patient's with aortic or mitral valve disease) and in patients who have undergone heart valve repair or replacement.  Certain surgical and dental procedures may put you at risk, such as dental cleaning, other dental procedures, or any surgery involving the respiratory, urinary, gastrointestinal tract, gallbladder or prostate gland.   To minimize your chances for develooping endocarditis, maintain good oral health and seek prompt medical attention for any infections involving the mouth, teeth, gums, skin or urinary tract.  Always notify your doctor or dentist about your underlying heart valve condition before having any invasive procedures. You will need to take antibiotics before certain procedures.

## 2013-07-08 NOTE — Progress Notes (Signed)
      [address]       Mary [name] [address]             [phone]     CARDIOTHORACIC SURGERY OFFICE NOTE  Referring Provider is Hillis Range, MD PCP is No PCP Per Patient   HPI:  Patient returns for routine followup status post minimally invasive mitral valve replacement using a mechanical prosthesis, tricuspid valve repair, and repair of incarcerated right femoral hernia on 05/23/2013. Early postoperatively she developed complete heart block which resolved.  By the time she was discharged from the hospital she was stable with long first degree AV block. After her initial hospital discharge she was readmitted to the hospital with diarrhea and dehydration. Transesophageal echocardiogram was performed during that admission which revealed normal valve function but questions small oscillating density on the mitral annulus. I reviewed this echo personally and feel the abnormality corresponded to normal suture material. There also appeared to be some layered thrombus in the left atrial wall although the patient was therapeutic on Coumadin at the time. Her diuretics were stopped and since then she has done well. Since hospital discharge she is been seen in followup on one occasion by Jacolyn Reedy in her Coumadin dose is being monitored and adjusted through the Coumadin clinic. She returns to the office for routine followup today. She is doing exceptionally well. She is already back at work. She has no shortness of breath and she notes that her breathing is much better than it was prior to surgery.  She's not having any tachypalpitations or dizzy spells. She has no residual soreness in her chest although her right breast feels numb and sensitive. Overall she is delighted with her progress.   Current Outpatient Prescriptions  Medication Sig Dispense Refill  . aspirin 81 MG chewable tablet Chew 1 tablet (81 mg total) by mouth daily.      [name] warfarin (COUMADIN) 2.5 MG tablet Take as  directed by coumadin clinic  80 tablet  1   No current facility-administered medications for this visit.      Physical Exam:   BP 117/75  Pulse 106  Resp 16  Ht 5\' 2"  (1.575 m)  Wt 119 lb 8 oz (54.205 kg)  BMI 21.85 kg/m2  SpO2 96%  LMP 01/28/2013  General:  Well-appearing  Chest:   Clear to auscultation with symmetrical breath sounds  CV:   Regular rate and rhythm with mechanical heart sounds  Incisions:  Healing nicely  Abdomen:  Soft and nontender  Extremities:  Warm and well-perfused  Diagnostic Tests:  n/a   Impression:  Patient is doing quite well less than 2 months status post minimally invasive mitral valve replacement with a mechanical prosthesis and tricuspid valve repair.   Plan:  Now that the patient is stable and therapeutic on Coumadin I have recommended that she stop taking aspirin. She may continue to increase her physical activity without particular limitation at this time. She has been reminded regarding the need for long-term endocarditis prophylaxis and regular dental exams. We will plan to see her back in 4 months. Some point within the next few months she should probably have a repeat echocardiogram performed.   Salvatore Decent. Cornelius Moras, MD 07/08/2013 3:21 PM

## 2013-07-16 ENCOUNTER — Telehealth (HOSPITAL_COMMUNITY): Payer: Self-pay | Admitting: Cardiac Rehabilitation

## 2013-07-16 NOTE — Telephone Encounter (Signed)
pc to pt to enroll in cardiac rehab services. Pt declined at this time, she is exercising on her own. Pt informed her insurance eligibility for cardiac rehab services is available up to 6 months after her surgical date.  Dr Clifton James made aware.

## 2013-07-22 ENCOUNTER — Ambulatory Visit (INDEPENDENT_AMBULATORY_CARE_PROVIDER_SITE_OTHER): Payer: Medicaid Other | Admitting: General Practice

## 2013-07-22 DIAGNOSIS — I059 Rheumatic mitral valve disease, unspecified: Secondary | ICD-10-CM

## 2013-07-22 DIAGNOSIS — Z7901 Long term (current) use of anticoagulants: Secondary | ICD-10-CM

## 2013-07-22 DIAGNOSIS — Z954 Presence of other heart-valve replacement: Secondary | ICD-10-CM

## 2013-08-08 ENCOUNTER — Ambulatory Visit: Payer: Self-pay | Admitting: Cardiology

## 2013-08-15 ENCOUNTER — Ambulatory Visit: Payer: Medicaid Other | Admitting: Internal Medicine

## 2013-08-21 ENCOUNTER — Encounter: Payer: Self-pay | Admitting: *Deleted

## 2013-08-26 ENCOUNTER — Encounter: Payer: Self-pay | Admitting: Cardiology

## 2013-08-26 ENCOUNTER — Ambulatory Visit (INDEPENDENT_AMBULATORY_CARE_PROVIDER_SITE_OTHER): Payer: Medicaid Other | Admitting: Cardiology

## 2013-08-26 ENCOUNTER — Ambulatory Visit (INDEPENDENT_AMBULATORY_CARE_PROVIDER_SITE_OTHER): Payer: Medicaid Other | Admitting: General Practice

## 2013-08-26 VITALS — BP 120/80 | HR 110 | Ht 62.0 in | Wt 124.0 lb

## 2013-08-26 DIAGNOSIS — Z9889 Other specified postprocedural states: Secondary | ICD-10-CM

## 2013-08-26 DIAGNOSIS — I059 Rheumatic mitral valve disease, unspecified: Secondary | ICD-10-CM

## 2013-08-26 DIAGNOSIS — Z7901 Long term (current) use of anticoagulants: Secondary | ICD-10-CM

## 2013-08-26 DIAGNOSIS — Z954 Presence of other heart-valve replacement: Secondary | ICD-10-CM

## 2013-08-26 DIAGNOSIS — I340 Nonrheumatic mitral (valve) insufficiency: Secondary | ICD-10-CM

## 2013-08-26 MED ORDER — WARFARIN SODIUM 2.5 MG PO TABS: ORAL_TABLET | ORAL | Status: DC

## 2013-08-26 NOTE — Progress Notes (Signed)
Patient ID: Mary [name], female   DOB: 1960-12-27, 52 y.o.   MRN: [id number] 52 yo presents for followup after mechanical mitral valve replacement and tricuspid valve repair.  Patient was in [facility] in 6/14 with severe MR and endocarditis. In 8/14, she had right mini-thoracotomy with mechanical MVR and TV repair.  Post-op course was complicated by complete heart block that recovered without needing a PCM.  Post-op echo in 9/14 showed normal EF and a well-seated mechanical mitral valve.    Patient has been doing well recently.  She is back at work in a [employer]  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  She denies exertional dyspnea.  She can climb up a flight of steps without problems.  No chest pain.  No overt bleeding.    ECG: sinus tachycardia at 110, RAE, left axis deviation.   Labs (9/14): K 3.7, creatinine 0.96  PMH: 1. 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  In 8/14, had right mini-thoracotomy with mechanical mitral valve replacement (St Jude) + tricuspid valve repair.  Echo (9/14): EF 55-60%, well-seated mechanical mitral valve. 2. Post-op complete heart block in 8/14, resolved.  3. LHC (6/14) with no significant coronary artery disease.  4. Right femoral hernia repair 8/14.   SH: Lives in [place], works in a catering business, nonsmoker, 2 sons/1 daughter.   FH: No premature CAD  ROS: All systems reviewed and negative except as per HPI.   Current Outpatient Prescriptions  Medication Sig Dispense Refill  . aspirin EC 81 MG tablet Take 1 tablet (81 mg total) by mouth daily.      [name] warfarin (COUMADIN) 2.5 MG tablet Take as directed by coumadin clinic  80 tablet  1   No current facility-administered medications for this visit.    BP 120/80  Pulse 110  Ht 5\' 2"  (1.575 m)  Wt 124 lb (56.246 kg)  BMI 22.67 kg/m2  LMP 01/28/2013 General: NAD Neck: No JVD, no thyromegaly or thyroid nodule.  Lungs: Clear to auscultation bilaterally  with normal respiratory effort. CV: Nondisplaced PMI.  Heart regular S1/S2, no S3/S4, mechanical S2, 1/6 SEM.  No peripheral edema.  No carotid bruit.  Normal pedal pulses.  Abdomen: Soft, nontender, no hepatosplenomegaly, no distention.  Neurologic: Alert and oriented x 3.  Psych: Normal affect. Extremities: No clubbing or cyanosis.   Assessment/Plan: 1. Mechanical MVR/tricuspid valve repair: Valve looked ok on echo in 9/14.  EF was normal.  She is doing well symptomatically.  She will need to continue warfarin with goal INR 2.5-3.5.  She needs to start ASA 81 daily.  She will need endocarditis prophylaxis with dental work.  2. Patient needs to get a PCP.   Followup in 6 months.   Marca Ancona 08/26/2013 2:08 PM

## 2013-08-26 NOTE — Patient Instructions (Signed)
Start aspirin 81mg  daily.   Your physician wants you to follow-up in: 6 months with Dr Shirlee Latch. You will receive a reminder letter in the mail two months in advance. If you don't receive a letter, please call our office to schedule the follow-up appointment.

## 2013-08-27 ENCOUNTER — Encounter: Payer: Self-pay | Admitting: Cardiology

## 2013-08-27 ENCOUNTER — Encounter: Payer: Medicaid Other | Admitting: Cardiology

## 2013-08-29 NOTE — Progress Notes (Signed)
  This encounter was created in error - please disregard. Appointment was rescheduled.

## 2013-09-02 NOTE — Progress Notes (Signed)
ELECTROPHYSIOLOGY OFFICE NOTE  Patient ID: Mary [name] MRN: [id number], DOB/AGE: Apr 16, 1961   Date of Visit: 09/03/2013  Primary Physician: No PCP Per Patient Primary Cardiologist / Primary EP: Shirlee Latch, MD / Allred, MD Reason for Visit: Hospital follow-up for bradycardia  History of Present Illness  Mary [name] is a 52 y.o. female with valvular heart disease who presents today for post hospital EP follow-up. 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 LHC showed no significant CAD. In August 2014, she had right mini-thoracotomy with mechanical MVR and TV repair. Post-op course was complicated by complete heart block that resolved without needing a permanent pacemaker. Post-op echo in Sept 2014 showed normal EF and a well-seated mechanical mitral valve.      Today, she reports she is doing well and has no complaints. She is back at work and is exercising regularly. She denies chest pain or shortness of breath. She denies palpitations, dizziness, near syncope or syncope. She denies LE swelling, orthopnea or PND. She is compliant with medications.  Past Medical History Past Medical History  Diagnosis Date  . Asthma     "as a baby"   . Anemia   . History of blood transfusion     "14 w/1st pregnancy; 2 w/last C-section" (03/15/2013)  . GERD (gastroesophageal reflux disease)   . Hypertension     no pcp   will go to [facility]  . Severe mitral regurgitation 04/22/2013  . SVT (supraventricular tachycardia) 03/16/2013  . Acute diastolic heart failure 03/15/2013  . 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 Tricuspid regurgitation   . 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    33mm Sorin Carbomedics mechanical prosthesis via right mini thoracotomy approach  . 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    Complex valvuloplasty including Cor-matrix ECM patch augmentation of anterior and lateral leaflets with 26mm Edwards mc3 ring annuloplasty  via right mini-thoracotomy approach  . Complete heart block 05/28/2013    Post-op  . Epigastric hernia 200's    Past Surgical History Past Surgical History  Procedure Laterality Date  . Appendectomy  ~1978  . Cesarean section  1983; 1999  . Laparoscopic cholecystectomy  2003  . Cesarean section with bilateral tubal ligation  1999  . Tee without cardioversion N/A 03/18/2013    Procedure: TRANSESOPHAGEAL ECHOCARDIOGRAM (TEE);  Surgeon: Laurey Morale, MD;  Location: [facility] ENDOSCOPY;  Service: Cardiovascular;  Laterality: N/A;  . Cardiac catheterization    . Multiple extractions with alveoloplasty N/A 04/04/2013    Procedure: Extraction of tooth #'s 1,8,9,13,14,15,23,24,25,26 with alveoloplasty and gross debridement of remaining teeth;  Surgeon: Charlynne Pander, DDS;  Location: [facility] OR;  Service: Oral Surgery;  Laterality: N/A;  . Minimally invasive tricuspid valve repair Right 05/23/2013    Procedure: MINIMALLY INVASIVE TRICUSPID VALVE REPAIR;  Surgeon: Purcell Nails, MD;  Location: [facility] OR;  Service: Open Heart Surgery;  Laterality: Right;  . Intraoperative transesophageal echocardiogram N/A 05/23/2013    Procedure: INTRAOPERATIVE TRANSESOPHAGEAL ECHOCARDIOGRAM;  Surgeon: Purcell Nails, MD;  Location: [facility] OR;  Service: Open Heart Surgery;  Laterality: N/A;  . Femoral hernia repair Right 05/23/2013    Procedure: HERNIA REPAIR FEMORAL;  Surgeon: Purcell Nails, MD;  Location: [facility] OR;  Service: Open Heart Surgery;  Laterality: Right;  . Mitral valve replacement N/A 05/23/2013    Procedure: MITRAL VALVE (MV) REPLACEMENT;  Surgeon: Purcell Nails, MD;  Location: [facility] OR;  Service: Open Heart Surgery;  Laterality:  N/A;  Rhae Hammock without cardioversion N/A 06/17/2013    Procedure: TRANSESOPHAGEAL ECHOCARDIOGRAM (TEE);  Surgeon: Lewayne Bunting, MD;  Location: [facility] ENDOSCOPY;  Service: Cardiovascular;  Laterality: N/A;    Allergies/Intolerances Allergies  Allergen Reactions  . Oxycodone Itching    Current  Home Medications Current Outpatient Prescriptions  Medication Sig Dispense Refill  . aspirin EC 81 MG tablet Take 1 tablet (81 mg total) by mouth daily.      [name] warfarin (COUMADIN) 2.5 MG tablet Take as directed by coumadin clinic  80 tablet  1   No current facility-administered medications for this visit.    Social History History   Social History  . Marital Status: Single    Spouse Name: N/A    Number of Children: N/A  . Years of Education: N/A   Occupational History  . Not on file.   Social History Main Topics  . Smoking status: Current Every Day Smoker -- 0.50 packs/day for 36 years    Types: Cigarettes    Last Attempt to Quit: 03/15/2013  . Smokeless tobacco: Never Used  . Alcohol Use: 3.6 oz/week    6 Glasses of wine per week     Comment: 03/15/2013 "bottle of wine/wk"  . Drug Use: No  . Sexual Activity: No   Other Topics Concern  . Not on file   Social History Narrative  . No narrative on file     Review of Systems General: No chills, fever, night sweats or weight changes Cardiovascular: No chest pain, dyspnea on exertion, edema, orthopnea, palpitations, paroxysmal nocturnal dyspnea Dermatological: No rash, lesions or masses Respiratory: No cough, dyspnea Urologic: No hematuria, dysuria Abdominal: No nausea, vomiting, diarrhea, bright red blood per rectum, melena, or hematemesis Neurologic: No visual changes, weakness, changes in mental status All other systems reviewed and are otherwise negative except as noted above.  Physical Exam Vitals: Blood pressure 118/80, pulse 97, weight 127 lb (57.607 kg), last menstrual period 01/28/2013.  General: Well developed, well appearing 52 y.o. female in no acute distress. HEENT: Normocephalic, atraumatic. EOMs intact. Sclera nonicteric. Oropharynx clear.  Neck: Supple. No JVD. Lungs: Respirations regular and unlabored, CTA bilaterally. No wheezes, rales or rhonchi. Heart: RRR. S1, S2 present. Crisp mechanical S2. No  murmurs, rub, S3 or S4. Abdomen: Soft, non-distended.   Extremities: No clubbing, cyanosis or edema. PT/Radials 2+ and equal bilaterally. Psych: Normal affect. Neuro: Alert and oriented X 3. Moves all extremities spontaneously.   Diagnostics 12-lead ECG today - SR with first degree AV block; PR 212, QRS 98, QT/QTc 378/480  Assessment and Plan 1. Complete heart block s/p mechanical MVR and TV repair - resolved without needing permanent pacemaker - follow-up with Dr. Johney Frame as needed 2. Valvular heart disease / endocarditis s/p MVR and TV repair - followed by Dr. Shirlee Latch  Signed, Cristo Ausburn, PA-C 09/03/2013, 8:30 AM

## 2013-09-03 ENCOUNTER — Ambulatory Visit (INDEPENDENT_AMBULATORY_CARE_PROVIDER_SITE_OTHER): Payer: Medicaid Other | Admitting: Cardiology

## 2013-09-03 ENCOUNTER — Encounter: Payer: Self-pay | Admitting: Cardiology

## 2013-09-03 VITALS — BP 118/80 | HR 97 | Wt 127.0 lb

## 2013-09-03 DIAGNOSIS — I498 Other specified cardiac arrhythmias: Secondary | ICD-10-CM

## 2013-09-03 DIAGNOSIS — I38 Endocarditis, valve unspecified: Secondary | ICD-10-CM

## 2013-09-03 DIAGNOSIS — Z952 Presence of prosthetic heart valve: Secondary | ICD-10-CM

## 2013-09-03 DIAGNOSIS — Z9889 Other specified postprocedural states: Secondary | ICD-10-CM

## 2013-09-03 DIAGNOSIS — I442 Atrioventricular block, complete: Secondary | ICD-10-CM

## 2013-09-03 DIAGNOSIS — R001 Bradycardia, unspecified: Secondary | ICD-10-CM

## 2013-09-03 DIAGNOSIS — Z954 Presence of other heart-valve replacement: Secondary | ICD-10-CM

## 2013-09-03 NOTE — Patient Instructions (Signed)
Your physician recommends that you schedule a follow-up appointment as needed with Dr Allred  Your physician recommends that you continue on your current medications as directed. Please refer to the Current Medication list given to you today.   

## 2013-11-08 ENCOUNTER — Emergency Department (HOSPITAL_COMMUNITY)
Admission: EM | Admit: 2013-11-08 | Discharge: 2013-11-08 | Discharge status: Against Medical Advice | Payer: Medicaid Other | Attending: Emergency Medicine | Admitting: Emergency Medicine

## 2013-11-08 ENCOUNTER — Emergency Department (HOSPITAL_COMMUNITY): Payer: Medicaid Other

## 2013-11-08 ENCOUNTER — Encounter (HOSPITAL_COMMUNITY): Payer: Self-pay | Admitting: Emergency Medicine

## 2013-11-08 DIAGNOSIS — J45901 Unspecified asthma with (acute) exacerbation: Secondary | ICD-10-CM | POA: Insufficient documentation

## 2013-11-08 DIAGNOSIS — Z9889 Other specified postprocedural states: Secondary | ICD-10-CM | POA: Insufficient documentation

## 2013-11-08 DIAGNOSIS — F17200 Nicotine dependence, unspecified, uncomplicated: Secondary | ICD-10-CM | POA: Insufficient documentation

## 2013-11-08 DIAGNOSIS — Z7901 Long term (current) use of anticoagulants: Secondary | ICD-10-CM | POA: Insufficient documentation

## 2013-11-08 DIAGNOSIS — K5289 Other specified noninfective gastroenteritis and colitis: Secondary | ICD-10-CM | POA: Insufficient documentation

## 2013-11-08 DIAGNOSIS — I10 Essential (primary) hypertension: Secondary | ICD-10-CM | POA: Insufficient documentation

## 2013-11-08 DIAGNOSIS — Z7982 Long term (current) use of aspirin: Secondary | ICD-10-CM | POA: Insufficient documentation

## 2013-11-08 DIAGNOSIS — I5031 Acute diastolic (congestive) heart failure: Secondary | ICD-10-CM | POA: Insufficient documentation

## 2013-11-08 DIAGNOSIS — Z862 Personal history of diseases of the blood and blood-forming organs and certain disorders involving the immune mechanism: Secondary | ICD-10-CM | POA: Insufficient documentation

## 2013-11-08 DIAGNOSIS — Z3202 Encounter for pregnancy test, result negative: Secondary | ICD-10-CM | POA: Insufficient documentation

## 2013-11-08 DIAGNOSIS — K529 Noninfective gastroenteritis and colitis, unspecified: Secondary | ICD-10-CM

## 2013-11-08 DIAGNOSIS — Z9089 Acquired absence of other organs: Secondary | ICD-10-CM | POA: Insufficient documentation

## 2013-11-08 LAB — CBC WITH DIFFERENTIAL/PLATELET
BASOS ABS: 0 10*3/uL (ref 0.0–0.1)
BASOS PCT: 0 % (ref 0–1)
Eosinophils Absolute: 0.2 10*3/uL (ref 0.0–0.7)
Eosinophils Relative: 2 % (ref 0–5)
HCT: 41 % (ref 36.0–46.0)
Hemoglobin: 14.7 g/dL (ref 12.0–15.0)
Lymphocytes Relative: 9 % — ABNORMAL LOW (ref 12–46)
Lymphs Abs: 1 10*3/uL (ref 0.7–4.0)
MCH: 34 pg (ref 26.0–34.0)
MCHC: 35.9 g/dL (ref 30.0–36.0)
MCV: 94.9 fL (ref 78.0–100.0)
MONO ABS: 0.7 10*3/uL (ref 0.1–1.0)
Monocytes Relative: 6 % (ref 3–12)
NEUTROS ABS: 9.1 10*3/uL — AB (ref 1.7–7.7)
Neutrophils Relative %: 83 % — ABNORMAL HIGH (ref 43–77)
Platelets: 464 10*3/uL — ABNORMAL HIGH (ref 150–400)
RBC: 4.32 MIL/uL (ref 3.87–5.11)
RDW: 14.1 % (ref 11.5–15.5)
WBC: 11 10*3/uL — ABNORMAL HIGH (ref 4.0–10.5)

## 2013-11-08 LAB — URINALYSIS, ROUTINE W REFLEX MICROSCOPIC
Glucose, UA: NEGATIVE mg/dL
KETONES UR: 40 mg/dL — AB
Nitrite: NEGATIVE
Specific Gravity, Urine: 1.017 (ref 1.005–1.030)
Urobilinogen, UA: 0.2 mg/dL (ref 0.0–1.0)
pH: 6 (ref 5.0–8.0)

## 2013-11-08 LAB — POCT PREGNANCY, URINE: Preg Test, Ur: NEGATIVE

## 2013-11-08 LAB — PROTIME-INR
INR: 1.29 (ref 0.00–1.49)
Prothrombin Time: 15.8 seconds — ABNORMAL HIGH (ref 11.6–15.2)

## 2013-11-08 LAB — URINE MICROSCOPIC-ADD ON

## 2013-11-08 MED ORDER — SODIUM CHLORIDE 0.9 % IV BOLUS (SEPSIS)
1000.0000 mL | INTRAVENOUS | Status: AC
  Administered 2013-11-08: 1000 mL via INTRAVENOUS

## 2013-11-08 NOTE — ED Notes (Signed)
Pt states minimal abdominal pain, states has started her period for first time in a year, doesn't know how much that has contributed to this, states cannot count how many times has had diarrhea, last emesis was in ambulance, states nausea has decreased since having zofran

## 2013-11-08 NOTE — ED Notes (Signed)
Per EMS. Called for nausea, vomiting and diarrhea since Wednesday, unable to keep fluids/food down x 2 days, c/o weakness, HR 120, BP 130/90, CBG 79. # 22 left AC per EMS, given Zofran 4 mg

## 2013-11-08 NOTE — ED Provider Notes (Signed)
CSN: [id number]     Arrival date & time 11/08/13  1041 History   First MD Initiated Contact with Patient 11/08/13 1052     Chief Complaint  Patient presents with  . Nausea  . Emesis  . Diarrhea     (Consider location/radiation/quality/duration/timing/severity/associated sxs/prior Treatment) Patient is a 53 y.o. female presenting with vomiting and diarrhea. The history is provided by the patient.  Emesis Severity:  Moderate Duration:  2 days Timing:  Constant Quality:  Undigested food and stomach contents Able to tolerate:  Liquids Progression:  Unchanged Chronicity:  New Relieved by:  Nothing Worsened by:  Nothing tried Ineffective treatments:  None tried Associated symptoms: abdominal pain (mild left sided abd pain en route) and diarrhea   Associated symptoms: no headaches   Diarrhea:    Quality:  Watery   Severity:  Moderate   Duration:  2 days   Timing:  Constant   Progression:  Unchanged Diarrhea Associated symptoms: abdominal pain (mild left sided abd pain en route) and vomiting   Associated symptoms: no fever and no headaches     Past Medical History  Diagnosis Date  . Asthma     "as a baby"   . Anemia   . History of blood transfusion     "14 w/1st pregnancy; 2 w/last C-section" (03/15/2013)  . GERD (gastroesophageal reflux disease)   . Hypertension     no pcp   will go to [facility]  . Severe mitral regurgitation 04/22/2013  . SVT (supraventricular tachycardia) 03/16/2013  . Acute diastolic heart failure 99991111  . 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 Tricuspid regurgitation   . S/P mitral valve replacement with metallic valve [id number]    8mm Sorin Carbomedics mechanical prosthesis via right mini thoracotomy approach  . 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    Complex valvuloplasty including Cor-matrix ECM patch augmentation of anterior and lateral leaflets with 66mm Edwards mc3 ring annuloplasty via right mini-thoracotomy approach  .  Complete heart block 05/28/2013    Post-op  . Epigastric hernia 200's   Past Surgical History  Procedure Laterality Date  . Appendectomy  ~1978  . Cesarean section  1983; 1999  . Laparoscopic cholecystectomy  2003  . Cesarean section with bilateral tubal ligation  1999  . Tee without cardioversion N/A 03/18/2013    Procedure: TRANSESOPHAGEAL ECHOCARDIOGRAM (TEE);  Surgeon: Larey Dresser, MD;  Location: [place];  Service: Cardiovascular;  Laterality: N/A;  . Cardiac catheterization    . Multiple extractions with alveoloplasty N/A 04/04/2013    Procedure: Extraction of tooth #'s 1,8,9,13,14,15,23,24,25,26 with alveoloplasty and gross debridement of remaining teeth;  Surgeon: Lenn Cal, DDS;  Location: [place];  Service: Oral Surgery;  Laterality: N/A;  . Minimally invasive tricuspid valve repair Right 05/23/2013    Procedure: MINIMALLY INVASIVE TRICUSPID VALVE REPAIR;  Surgeon: Rexene Alberts, MD;  Location: [place];  Service: Open Heart Surgery;  Laterality: Right;  . Intraoperative transesophageal echocardiogram N/A 05/23/2013    Procedure: INTRAOPERATIVE TRANSESOPHAGEAL ECHOCARDIOGRAM;  Surgeon: Rexene Alberts, MD;  Location: [place];  Service: Open Heart Surgery;  Laterality: N/A;  . Femoral hernia repair Right 05/23/2013    Procedure: HERNIA REPAIR FEMORAL;  Surgeon: Rexene Alberts, MD;  Location: [place];  Service: Open Heart Surgery;  Laterality: Right;  . Mitral valve replacement N/A 05/23/2013    Procedure: MITRAL VALVE (MV) REPLACEMENT;  Surgeon: Rexene Alberts, MD;  Location: [place];  Service: Open Heart Surgery;  Laterality: N/A;  .  Tee without cardioversion N/A 06/17/2013    Procedure: TRANSESOPHAGEAL ECHOCARDIOGRAM (TEE);  Surgeon: Lelon Perla, MD;  Location: [facility] ENDOSCOPY;  Service: Cardiovascular;  Laterality: N/A;   History reviewed. No pertinent family history. History  Substance Use Topics  . Smoking status: Current Every Day Smoker -- 0.50 packs/day for 36 years     Types: Cigarettes    Last Attempt to Quit: 03/15/2013  . Smokeless tobacco: Never Used  . Alcohol Use: 3.6 oz/week    6 Glasses of wine per week     Comment: 03/15/2013 "bottle of wine/wk"   OB History   Grav Para Term Preterm Abortions TAB SAB Ect Mult Living                 Review of Systems  Constitutional: Negative for fever and fatigue.  HENT: Negative for congestion and drooling.   Eyes: Negative for pain.  Respiratory: Positive for shortness of breath (mild sob at rest). Negative for cough.   Cardiovascular: Negative for chest pain.  Gastrointestinal: Positive for vomiting, abdominal pain (mild left sided abd pain en route) and diarrhea. Negative for nausea.  Genitourinary: Negative for dysuria and hematuria.  Musculoskeletal: Negative for back pain, gait problem and neck pain.  Skin: Negative for color change.  Neurological: Negative for dizziness and headaches.  Hematological: Negative for adenopathy.  Psychiatric/Behavioral: Negative for behavioral problems.  All other systems reviewed and are negative.      Allergies  Oxycodone  Home Medications   Current Outpatient Rx  Name  Route  Sig  Dispense  Refill  . aspirin EC 81 MG tablet   Oral   Take 1 tablet (81 mg total) by mouth daily.         [name] warfarin (COUMADIN) 2.5 MG tablet      Take as directed by coumadin clinic   80 tablet   1     This is number of pills for 1 month    BP 114/84  Pulse 131  Temp(Src) 97.6 F (36.4 C)  SpO2 98%  LMP 01/28/2013 Physical Exam  Nursing note and vitals reviewed. Constitutional: She is oriented to person, place, and time. She appears well-developed and well-nourished.  HENT:  Head: Normocephalic.  Mouth/Throat: Oropharynx is clear and moist. No oropharyngeal exudate.  Eyes: Conjunctivae and EOM are normal. Pupils are equal, round, and reactive to light.  Neck: Normal range of motion. Neck supple.  Cardiovascular: Regular rhythm, normal heart sounds and  intact distal pulses.  Exam reveals no gallop and no friction rub.   No murmur heard. HR 125  Pulmonary/Chest: Effort normal and breath sounds normal. No respiratory distress. She has no wheezes.  Abdominal: Soft. Bowel sounds are normal. There is no tenderness. There is no rebound and no guarding.  Musculoskeletal: Normal range of motion. She exhibits no edema and no tenderness.  Neurological: She is alert and oriented to person, place, and time.  Skin: Skin is warm and dry.  Psychiatric: She has a normal mood and affect. Her behavior is normal.    ED Course  Procedures (including critical care time) Labs Review Labs Reviewed  CBC WITH DIFFERENTIAL - Abnormal; Notable for the following:    WBC 11.0 (*)    Platelets 464 (*)    Neutrophils Relative % 83 (*)    Neutro Abs 9.1 (*)    Lymphocytes Relative 9 (*)    All other components within normal limits  PROTIME-INR - Abnormal; Notable for the following:  Prothrombin Time 15.8 (*)    All other components within normal limits  URINALYSIS, ROUTINE W REFLEX MICROSCOPIC - Abnormal; Notable for the following:    Color, Urine RED (*)    APPearance TURBID (*)    Hgb urine dipstick LARGE (*)    Bilirubin Urine MODERATE (*)    Ketones, ur 40 (*)    Protein, ur >300 (*)    Leukocytes, UA SMALL (*)    All other components within normal limits  URINE MICROSCOPIC-ADD ON  POCT PREGNANCY, URINE   Imaging Review Dg Chest 2 View  11/08/2013   CLINICAL DATA:  Smoking history. Short of breath. Weakness and dizziness.  EXAM: CHEST  2 VIEW  COMPARISON:  06/15/2013  FINDINGS: Artifact overlies the chest. The patient has had mitral and tricuspid valve replacement. Heart size remains normal. Pulmonary vascularity is normal. No effusions. No focal pulmonary abnormality. No acute bony finding.  IMPRESSION: Previous valve replacements. No active cardiopulmonary disease evident.   Electronically Signed   By: Nelson Chimes M.D.   On: 11/08/2013 11:54   Dg  Abd 1 View  11/08/2013   CLINICAL DATA:  Nausea and vomiting.  Diarrhea.  EXAM: ABDOMEN - 1 VIEW  COMPARISON:  CT CTA ABD/PEL W/CM AND/OR W/O CM dated 05/20/2013; CT ABD PELVIS W/CM dated 07/28/2005  FINDINGS: Bowel gas pattern unremarkable without evidence of obstruction or significant ileus. Very little stool burden in the colon. Surgical clips in the right upper quadrant from prior cholecystectomy. No abnormal calcifications. Regional skeleton intact.  IMPRESSION: No acute abdominal abnormality.   Electronically Signed   By: Evangeline Dakin M.D.   On: 11/08/2013 11:57    EKG Interpretation    Date/Time:  Friday November 08 2013 11:03:24 EST Ventricular Rate:  123 PR Interval:  145 QRS Duration: 97 QT Interval:  346 QTC Calculation: 495 R Axis:   -62 Text Interpretation:  Sinus tachycardia LAE, consider biatrial enlargement Abnormal R-wave progression, early transition Inferior infarct, old No significant change since last tracing Confirmed by Zaydon Kinser  MD, Derrian Rodak ([id number]) on 11/08/2013 11:09:36 AM            MDM   Final diagnoses:  Gastroenteritis    11:11 AM 53 y.o. female with a history of mitral valve replacement on coumadin who presents with nausea, vomiting, and diarrhea. She states that her symptoms began 2 days ago. She denies any fevers. She notes that she has been able to tolerate water but not solid food. She denies any chest pain but notes that she has some very mild shortness of breath today. She also had some mild intermittent dizziness with ambulation but denies this currently on exam. She is tachycardic her vital signs are unremarkable otherwise. She notes that she had some very mild left-sided abdominal pain in route via EMS but currently denies any abdominal pain. Will get screening imaging and labs. IV fluid for tachycardia likely related to dehydration from vomiting and diarrhea.  Mild leukocytosis on labs. INR sub therapeutic. UA w/ ketones, Hgb, small leuks.   Pt  left AMA without notifying me. I was not able to review her lab results w/ her or give her return precautions. I suspect she has a viral gastroenteritis.     Blanchard Kelch, MD 11/08/13 [phone]

## 2013-11-08 NOTE — ED Notes (Signed)
Bed: WA03 Expected date:  Expected time:  Means of arrival:  Comments: EMS 

## 2013-11-08 NOTE — ED Notes (Signed)
Pt stating that she wants to leave AMA. Stating that she has to go to work. Dr. Aline Brochure notified.

## 2013-11-11 ENCOUNTER — Ambulatory Visit (INDEPENDENT_AMBULATORY_CARE_PROVIDER_SITE_OTHER): Payer: Medicaid Other | Admitting: Thoracic Surgery (Cardiothoracic Vascular Surgery)

## 2013-11-11 ENCOUNTER — Encounter: Payer: Self-pay | Admitting: Thoracic Surgery (Cardiothoracic Vascular Surgery)

## 2013-11-11 VITALS — BP 122/73 | HR 106 | Resp 20 | Ht 62.0 in | Wt 127.0 lb

## 2013-11-11 DIAGNOSIS — Z9889 Other specified postprocedural states: Secondary | ICD-10-CM

## 2013-11-11 DIAGNOSIS — I079 Rheumatic tricuspid valve disease, unspecified: Secondary | ICD-10-CM

## 2013-11-11 DIAGNOSIS — I059 Rheumatic mitral valve disease, unspecified: Secondary | ICD-10-CM

## 2013-11-11 DIAGNOSIS — Z954 Presence of other heart-valve replacement: Secondary | ICD-10-CM

## 2013-11-11 DIAGNOSIS — I071 Rheumatic tricuspid insufficiency: Secondary | ICD-10-CM

## 2013-11-11 DIAGNOSIS — I340 Nonrheumatic mitral (valve) insufficiency: Secondary | ICD-10-CM

## 2013-11-11 NOTE — Progress Notes (Signed)
[address]       [place] [address]             [phone]     CARDIOTHORACIC SURGERY OFFICE NOTE  Referring Provider is Burnell Blanks* PCP is No PCP Per Patient   HPI:  Patient returns for routine followup status post minimally invasive mitral valve replacement using a mechanical prosthesis and tricuspid valve repair on 05/23/2013 for rheumatic mitral and tricuspid valve disease.  She also underwent repair of incarcerated right femoral hernia at the same time.  Early postoperatively the patient had complete heart block, but this resolved without need for permanent pacemaker placement.  She was last seen here in our office on 07/08/2013. Since then she has continued to do very well. She is back at work and she reports normal exercise tolerance without any limitations whatsoever. She denies any significant exertional shortness of breath.  She has not had any problems with long-term Coumadin therapy. Overall she is getting along quite well without any particular problems or complaints.    Current Outpatient Prescriptions  Medication Sig Dispense Refill  . aspirin EC 81 MG tablet Take 1 tablet (81 mg total) by mouth daily.      [name] warfarin (COUMADIN) 2.5 MG tablet Take 5-7.5 mg by mouth daily. Takes 3 tablets on Monday Wednesday and Friday and 2 tablets the rest of the week       No current facility-administered medications for this visit.      Physical Exam:   BP 122/73  Pulse 106  Resp 20  Ht 5\' 2"  (1.575 m)  Wt 127 lb (57.607 kg)  BMI 23.22 kg/m2  SpO2 98%  LMP 01/28/2013  General:  Well-appearing  Chest:   Clear to auscultation  CV:   Regular rate and rhythm with mechanical heart sounds  Incisions:  Completely healed  Abdomen:  Soft nontender  Extremities:  Warm and well-perfused  Diagnostic Tests:  Transthoracic Echocardiography  Patient: Mary, [name] MR #: [id number] Study Date: 06/18/2013 Gender: F Age: 53 Height:  157.5cm Weight: 44kg BSA: 1.3m^2 Pt. Status: Room: [place], MD [place] ADMITTING Dossie Arbour Wendee Copp cc:  ------------------------------------------------------------ LV EF: 55% - 60%  ------------------------------------------------------------ History: PMH: Heart Failure With Preserved Ejection Fraction. Mitral Valve Replacement With Mechanical Valve. Tricuspid Valve Repair. Rheumatic Mitral Stenosis. Supraventricular Tachycardia. Presyncope. Endocarditis. Follow-Up Recent MVR  ------------------------------------------------------------ Study Conclusions  - Left ventricle: The cavity size was normal. Systolic function was normal. The estimated ejection fraction was in the range of 55% to 60%. Wall motion was normal; there were no regional wall motion abnormalities. - Mitral valve: No regurgitation is noted. Suggest TEE to better evaluate as cannot visualize valve well enough to exclude vegetation or abcess. A mechanical prosthesis was present and functioning normally. - Left atrium: The atrium was mildly to moderately dilated. Transthoracic echocardiography. M-mode, complete 2D, spectral Doppler, and color Doppler. Height: Height: 157.5cm. Height: 62in. Weight: Weight: 44kg. Weight: 96.8lb. Body mass index: BMI: 17.7kg/m^2. Body surface area: BSA: 1.69m^2. Blood pressure: 112/70. Patient status: Inpatient. Location: Bedside.  ------------------------------------------------------------  ------------------------------------------------------------ Left ventricle: The cavity size was normal. Systolic function was normal. The estimated ejection fraction was in the range of 55% to 60%. Wall motion was normal; there were no regional wall motion abnormalities.  ------------------------------------------------------------ Aortic valve: Trileaflet; normal thickness leaflets. Mobility was not  restricted. Doppler: Transvalvular velocity was within the normal range. There was no  stenosis. No regurgitation.  ------------------------------------------------------------ Aorta: Aortic root: The aortic root was normal in size.  ------------------------------------------------------------ Mitral valve: No regurgitation is noted. Suggest TEE to better evaluate as cannot visualize valve well enough to exclude vegetation or abcess. A mechanical prosthesis was present and functioning normally. Doppler: Transvalvular velocity was within the normal range. There was no evidence for stenosis. No regurgitation.  ------------------------------------------------------------ Left atrium: The atrium was mildly to moderately dilated.  ------------------------------------------------------------ Right ventricle: The cavity size was normal. Wall thickness was normal. Systolic function was normal.  ------------------------------------------------------------ Pulmonic valve: Not well visualized. The valve appears to be grossly normal. Doppler: Transvalvular velocity was within the normal range. There was no evidence for stenosis.  ------------------------------------------------------------ Tricuspid valve: Structurally normal valve. Doppler: Transvalvular velocity was within the normal range. Mild regurgitation.  ------------------------------------------------------------ Pulmonary artery: The main pulmonary artery was normal-sized. Systolic pressure was within the normal range.  ------------------------------------------------------------ Right atrium: The atrium was normal in size.  ------------------------------------------------------------ Pericardium: There was no pericardial effusion.  ------------------------------------------------------------ Systemic veins: Inferior vena cava: The vessel was normal in  size.  ------------------------------------------------------------  2D measurements Normal Doppler measurements Normal Left ventricle Main pulmonary LVID ED, 19.6 mm 43-52 artery chord, Pressure, S 27 mm =30 PLAX Hg LVID ES, 14.4 mm 23-38 Tricuspid valve chord, Regurg peak 222 cm/s ------ PLAX vel FS, chord, 27 % >29 Peak RV-RA 20 mm ------ PLAX gradient, S Hg LVPW, ED 8.11 mm ------ Systemic veins IVS/LVPW 0.99 <1.3 Estimated 7 mm ------ ratio, ED CVP Hg Ventricular septum Right ventricle IVS, ED 8.05 mm ------ Pressure, S 27 mm <30 Aorta Hg Root diam, 25 mm ------ ED Left atrium AP dim 35 mm ------ AP dim 2.48 cm/m^2 <2.2 index  ------------------------------------------------------------ Prepared and Electronically Authenticated by  Landry Corporal [id number]:[id number]    Impression:  Patient is doing very well more than 6 months status post minimally invasive mitral valve replacement with a mechanical prosthesis and tricuspid valve repair for rheumatic mitral and tricuspid valve disease.    Plan:  Patient will return in 6 months for routine followup.   Mary Gu. Roxy Manns, MD 11/11/2013 12:40 PM

## 2013-11-22 ENCOUNTER — Other Ambulatory Visit: Payer: Self-pay | Admitting: Pharmacist

## 2013-11-22 MED ORDER — WARFARIN SODIUM 2.5 MG PO TABS: ORAL_TABLET | ORAL | Status: DC

## 2013-11-27 ENCOUNTER — Ambulatory Visit (INDEPENDENT_AMBULATORY_CARE_PROVIDER_SITE_OTHER): Payer: Self-pay | Admitting: *Deleted

## 2013-11-27 DIAGNOSIS — Z7901 Long term (current) use of anticoagulants: Secondary | ICD-10-CM

## 2013-11-27 DIAGNOSIS — Z954 Presence of other heart-valve replacement: Secondary | ICD-10-CM

## 2013-11-27 DIAGNOSIS — I059 Rheumatic mitral valve disease, unspecified: Secondary | ICD-10-CM

## 2013-11-27 LAB — POCT INR: INR: 2.8

## 2013-11-27 MED ORDER — WARFARIN SODIUM 2.5 MG PO TABS: ORAL_TABLET | ORAL | Status: DC

## 2014-01-09 ENCOUNTER — Ambulatory Visit (INDEPENDENT_AMBULATORY_CARE_PROVIDER_SITE_OTHER): Payer: Medicaid Other | Admitting: *Deleted

## 2014-01-09 DIAGNOSIS — Z7901 Long term (current) use of anticoagulants: Secondary | ICD-10-CM

## 2014-01-09 DIAGNOSIS — Z954 Presence of other heart-valve replacement: Secondary | ICD-10-CM

## 2014-01-09 DIAGNOSIS — I059 Rheumatic mitral valve disease, unspecified: Secondary | ICD-10-CM

## 2014-01-09 LAB — POCT INR: INR: 2.2

## 2014-02-13 ENCOUNTER — Encounter: Payer: Self-pay | Admitting: *Deleted

## 2014-02-24 ENCOUNTER — Ambulatory Visit (INDEPENDENT_AMBULATORY_CARE_PROVIDER_SITE_OTHER): Payer: Medicaid Other | Admitting: Pharmacist

## 2014-02-24 ENCOUNTER — Encounter: Payer: Self-pay | Admitting: *Deleted

## 2014-02-24 ENCOUNTER — Ambulatory Visit (INDEPENDENT_AMBULATORY_CARE_PROVIDER_SITE_OTHER): Payer: Medicaid Other | Admitting: Cardiology

## 2014-02-24 ENCOUNTER — Encounter: Payer: Self-pay | Admitting: Cardiology

## 2014-02-24 VITALS — BP 125/79 | HR 114 | Ht 62.0 in | Wt 132.0 lb

## 2014-02-24 DIAGNOSIS — I059 Rheumatic mitral valve disease, unspecified: Secondary | ICD-10-CM

## 2014-02-24 DIAGNOSIS — Z954 Presence of other heart-valve replacement: Secondary | ICD-10-CM

## 2014-02-24 DIAGNOSIS — Z7901 Long term (current) use of anticoagulants: Secondary | ICD-10-CM

## 2014-02-24 DIAGNOSIS — R0609 Other forms of dyspnea: Secondary | ICD-10-CM

## 2014-02-24 DIAGNOSIS — R0600 Dyspnea, unspecified: Secondary | ICD-10-CM

## 2014-02-24 DIAGNOSIS — R0989 Other specified symptoms and signs involving the circulatory and respiratory systems: Secondary | ICD-10-CM

## 2014-02-24 DIAGNOSIS — R0602 Shortness of breath: Secondary | ICD-10-CM | POA: Insufficient documentation

## 2014-02-24 LAB — CBC WITH DIFFERENTIAL/PLATELET
Basophils Absolute: 0 10*3/uL (ref 0.0–0.1)
Basophils Relative: 0.2 % (ref 0.0–3.0)
Eosinophils Absolute: 0.4 10*3/uL (ref 0.0–0.7)
Eosinophils Relative: 4.5 % (ref 0.0–5.0)
HCT: 34.9 % — ABNORMAL LOW (ref 36.0–46.0)
Hemoglobin: 11.8 g/dL — ABNORMAL LOW (ref 12.0–15.0)
LYMPHS PCT: 14.7 % (ref 12.0–46.0)
Lymphs Abs: 1.3 10*3/uL (ref 0.7–4.0)
MCHC: 33.8 g/dL (ref 30.0–36.0)
MCV: 101.1 fl — ABNORMAL HIGH (ref 78.0–100.0)
MONOS PCT: 6.5 % (ref 3.0–12.0)
Monocytes Absolute: 0.6 10*3/uL (ref 0.1–1.0)
NEUTROS ABS: 6.6 10*3/uL (ref 1.4–7.7)
Neutrophils Relative %: 74.1 % (ref 43.0–77.0)
Platelets: 380 10*3/uL (ref 150.0–400.0)
RBC: 3.45 Mil/uL — ABNORMAL LOW (ref 3.87–5.11)
RDW: 14.6 % (ref 11.5–15.5)
WBC: 8.9 10*3/uL (ref 4.0–10.5)

## 2014-02-24 LAB — BASIC METABOLIC PANEL
BUN: 10 mg/dL (ref 6–23)
CHLORIDE: 105 meq/L (ref 96–112)
CO2: 25 mEq/L (ref 19–32)
Calcium: 9.4 mg/dL (ref 8.4–10.5)
Creatinine, Ser: 1.1 mg/dL (ref 0.4–1.2)
GFR: 70.93 mL/min (ref >60.00)
Glucose, Bld: 74 mg/dL (ref 70–99)
POTASSIUM: 4.1 meq/L (ref 3.5–5.1)
Sodium: 138 mEq/L (ref 135–145)

## 2014-02-24 LAB — POCT INR: INR: 2.2

## 2014-02-24 LAB — TSH: TSH: 0.88 u[IU]/mL (ref 0.35–4.50)

## 2014-02-24 LAB — BRAIN NATRIURETIC PEPTIDE: PRO B NATRI PEPTIDE: 39 pg/mL (ref 0.0–100.0)

## 2014-02-24 MED ORDER — WARFARIN SODIUM 2.5 MG PO TABS: ORAL_TABLET | ORAL | Status: DC

## 2014-02-24 NOTE — Patient Instructions (Signed)
Your physician recommends that you have lab today--CBCd/BMET/TSH/BNP.  Your physician has recommended that you wear a holter monitor. Holter monitors are medical devices that record the heart's electrical activity. Doctors most often use these monitors to diagnose arrhythmias. Arrhythmias are problems with the speed or rhythm of the heartbeat. The monitor is a small, portable device. You can wear one while you do your normal daily activities. This is usually used to diagnose what is causing palpitations/syncope (passing out). Mary [name] has requested that you have an echocardiogram. Echocardiography is a painless test that uses sound waves to create images of your heart. It provides your doctor with information about the size and shape of your heart and how well your heart's chambers and valves are working. This procedure takes approximately one hour. There are no restrictions for this procedure.  Your physician wants you to follow-up in: 4 months with Dr Aundra Dubin. (October 2015). You will receive a reminder letter in the mail two months in advance. If you don't receive a letter, please call our office to schedule the follow-up appointment.

## 2014-02-24 NOTE — Progress Notes (Signed)
Patient ID: Mary [name], female   DOB: 27-Oct-1960, 53 y.o.   MRN: [id number] 53 yo presents for followup after mechanical mitral valve replacement and tricuspid valve repair.  Patient was in [facility] in 6/14 with severe MR and endocarditis. In 8/14, she had right mini-thoracotomy with mechanical MVR and TV repair.  Post-op course was complicated by complete heart block that recovered without needing a PCM.  Post-op echo in 9/14 showed normal EF and a well-seated mechanical mitral valve.    Recently, she has been noting more dyspnea.  She is short of breath carrying a laundery basket, walking up steps or up hills.  She is short of breath walking from her house to [employer] (had been able to do this with no problems). She feels her heart race several times a week.  No lightheadedness or syncope.  She has been under stress recently.  No chest pain.  No overt bleeding (no melena or BRBPR). Weight is up about 8 lbs.   ECG: sinus tachycardia at 114, T wave inversion in III.   Labs (9/14): K 3.7, creatinine 0.96  PMH: 1. 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  In 8/14, had right mini-thoracotomy with mechanical mitral valve replacement ([employer] Jude) + tricuspid valve repair.  Echo (9/14): EF 55-60%, well-seated mechanical mitral valve. 2. Post-op complete heart block in 8/14, resolved.  3. LHC (6/14) with no significant coronary artery disease.  4. Right femoral hernia repair 8/14.   SH: Lives in [place], works in a [employer], nonsmoker, 2 sons/1 daughter.   FH: No premature CAD  ROS: All systems reviewed and negative except as per HPI.   Current Outpatient Prescriptions  Medication Sig Dispense Refill  . aspirin EC 81 MG tablet Take 1 tablet (81 mg total) by mouth daily.      [name] warfarin (COUMADIN) 2.5 MG tablet Take as directed by Anticoagulation clinic  70 tablet  2   No current facility-administered medications for this visit.    BP 125/79  Pulse 114  Ht  5\' 2"  (1.575 m)  Wt 59.875 kg (132 lb)  BMI 24.14 kg/m2  LMP 01/28/2013 General: NAD Neck: No JVD, no thyromegaly or thyroid nodule.  Lungs: Clear to auscultation bilaterally with normal respiratory effort. CV: Nondisplaced PMI.  Heart mildly tachycardic, regular S1/S2, no S3/S4, mechanical S2, 1/6 SEM.  No peripheral edema.  No carotid bruit.  Normal pedal pulses.  Abdomen: Soft, nontender, no hepatosplenomegaly, no distention.  Neurologic: Alert and oriented x 3.  Psych: Normal affect. Extremities: No clubbing or cyanosis.   Assessment/Plan: 1. Exertional dyspnea: Increased recently.  She does not appear volume overloaded on exam.  She is in sinus tachycardia but this has been noted in the past.   - I will get an echocardiogram to reassess the TV and MV.   - Check BNP, CBC, TSH.  2. Mechanical MVR/tricuspid valve repair: As above, echo to reassess the valve.  She will need to continue warfarin with goal INR 2.5-3.5.  Continue ASA 81 mg daily.  She will need endocarditis prophylaxis with dental work.  3. Palpitations: Frequent recently, feels runs of fast rhythm.  She is at risk for atrial fibrillation.  I will arrange 48 hour holter.    Followup in 6 months.   Larey Dresser 02/24/2014 10:18 PM

## 2014-02-25 ENCOUNTER — Encounter: Payer: Self-pay | Admitting: *Deleted

## 2014-02-25 ENCOUNTER — Encounter (INDEPENDENT_AMBULATORY_CARE_PROVIDER_SITE_OTHER): Payer: Medicaid Other

## 2014-02-25 ENCOUNTER — Other Ambulatory Visit: Payer: Self-pay | Admitting: *Deleted

## 2014-02-25 DIAGNOSIS — Z954 Presence of other heart-valve replacement: Secondary | ICD-10-CM

## 2014-02-25 DIAGNOSIS — I059 Rheumatic mitral valve disease, unspecified: Secondary | ICD-10-CM

## 2014-02-25 DIAGNOSIS — R002 Palpitations: Secondary | ICD-10-CM

## 2014-02-25 NOTE — Progress Notes (Signed)
Patient ID: Mary [name], female   DOB: [birth year], 53 y.o.   MRN: [id number] EVO 48 hour holter monitor applied to patient.

## 2014-03-04 ENCOUNTER — Telehealth: Payer: Self-pay | Admitting: Cardiology

## 2014-03-04 NOTE — Telephone Encounter (Signed)
Spoke with pt and she denies lightheadedness and presyncope while wearing heart monitor, but over the last 2 days pt has had some dizziness with bending over (doing laundry). She has had episodes where she feels that should could pass out, but no syncope.

## 2014-03-04 NOTE — Telephone Encounter (Addendum)
            Consult scheduled with Dr. Lovena Le on 03/06/2014, pt notified

## 2014-03-04 NOTE — Telephone Encounter (Signed)
DOD Dr. Irish Lack 48 hour holter interpretation: Any lightheadedness or syncope? Transient CHB. Needs referral to EP ASAP. Pt should not be on any rate slowing drugs. ek

## 2014-03-06 ENCOUNTER — Institutional Professional Consult (permissible substitution): Payer: Self-pay | Admitting: Internal Medicine

## 2014-03-12 ENCOUNTER — Other Ambulatory Visit (HOSPITAL_COMMUNITY): Payer: Self-pay

## 2014-03-24 ENCOUNTER — Ambulatory Visit (INDEPENDENT_AMBULATORY_CARE_PROVIDER_SITE_OTHER): Payer: Self-pay | Admitting: *Deleted

## 2014-03-24 DIAGNOSIS — I059 Rheumatic mitral valve disease, unspecified: Secondary | ICD-10-CM

## 2014-03-24 DIAGNOSIS — Z954 Presence of other heart-valve replacement: Secondary | ICD-10-CM

## 2014-03-24 DIAGNOSIS — Z7901 Long term (current) use of anticoagulants: Secondary | ICD-10-CM

## 2014-03-24 LAB — POCT INR: INR: 8

## 2014-03-25 ENCOUNTER — Telehealth: Payer: Self-pay | Admitting: *Deleted

## 2014-03-25 NOTE — Telephone Encounter (Signed)
Pt called asking if she should hold ASA as well as her coumadin INR is greater than 11 done on 03/24/2014. Dr Ronnald Collum reviewed her chart and instructed that she could hold  ASA as well and this message given to pt with verbalization of understanding.

## 2014-03-27 ENCOUNTER — Ambulatory Visit (INDEPENDENT_AMBULATORY_CARE_PROVIDER_SITE_OTHER): Payer: Medicaid Other | Admitting: *Deleted

## 2014-03-27 DIAGNOSIS — Z7901 Long term (current) use of anticoagulants: Secondary | ICD-10-CM

## 2014-03-27 DIAGNOSIS — Z954 Presence of other heart-valve replacement: Secondary | ICD-10-CM

## 2014-03-27 DIAGNOSIS — I059 Rheumatic mitral valve disease, unspecified: Secondary | ICD-10-CM

## 2014-03-27 LAB — POCT INR: INR: 4.3

## 2014-03-31 ENCOUNTER — Other Ambulatory Visit (HOSPITAL_COMMUNITY): Payer: Self-pay

## 2014-04-01 ENCOUNTER — Encounter: Payer: Self-pay | Admitting: Internal Medicine

## 2014-04-01 ENCOUNTER — Ambulatory Visit (INDEPENDENT_AMBULATORY_CARE_PROVIDER_SITE_OTHER): Payer: Medicaid Other | Admitting: Internal Medicine

## 2014-04-01 ENCOUNTER — Ambulatory Visit (INDEPENDENT_AMBULATORY_CARE_PROVIDER_SITE_OTHER): Payer: Medicaid Other | Admitting: Pharmacist

## 2014-04-01 ENCOUNTER — Ambulatory Visit (HOSPITAL_COMMUNITY): Payer: Medicaid Other | Attending: Cardiology | Admitting: Cardiology

## 2014-04-01 VITALS — BP 128/88 | HR 105 | Ht 62.0 in | Wt 133.8 lb

## 2014-04-01 DIAGNOSIS — R002 Palpitations: Secondary | ICD-10-CM | POA: Diagnosis not present

## 2014-04-01 DIAGNOSIS — Z7901 Long term (current) use of anticoagulants: Secondary | ICD-10-CM

## 2014-04-01 DIAGNOSIS — R0989 Other specified symptoms and signs involving the circulatory and respiratory systems: Secondary | ICD-10-CM | POA: Diagnosis not present

## 2014-04-01 DIAGNOSIS — Z954 Presence of other heart-valve replacement: Secondary | ICD-10-CM

## 2014-04-01 DIAGNOSIS — I471 Supraventricular tachycardia, unspecified: Secondary | ICD-10-CM

## 2014-04-01 DIAGNOSIS — I059 Rheumatic mitral valve disease, unspecified: Secondary | ICD-10-CM | POA: Diagnosis present

## 2014-04-01 DIAGNOSIS — I517 Cardiomegaly: Secondary | ICD-10-CM | POA: Insufficient documentation

## 2014-04-01 DIAGNOSIS — R0609 Other forms of dyspnea: Secondary | ICD-10-CM | POA: Diagnosis not present

## 2014-04-01 DIAGNOSIS — I498 Other specified cardiac arrhythmias: Secondary | ICD-10-CM

## 2014-04-01 DIAGNOSIS — I442 Atrioventricular block, complete: Secondary | ICD-10-CM

## 2014-04-01 LAB — POCT INR: INR: 1.8

## 2014-04-01 NOTE — Progress Notes (Signed)
Echo performed. 

## 2014-04-01 NOTE — Assessment & Plan Note (Signed)
She is asymptomatic and her heart block has only been documented while sleeping. I have discussed the signs of symptomatic bradycardia with the patient and she will undergo watchful waiting. Asymptomatic CHB while sleeping is not an indication for PPM.

## 2014-04-01 NOTE — Progress Notes (Signed)
HPI Mary [name] is referred today by Dr. Aundra Dubin for consideration of PPM insertion. She is a pleasant 53 yo woman with rheumatic heart disease, s/p MVR who developed post op CHB which eventually resolved. She has done well. She has been off of all AV nodal blocking drugs. She wore a cardiac monitor several weeks ago which showed spontaneously occuring CHB while asleep. She feels well and has had no problems except for weight gain. Allergies  Allergen Reactions  . Oxycodone Itching     Current Outpatient Prescriptions  Medication Sig Dispense Refill  . aspirin EC 81 MG tablet Take 1 tablet (81 mg total) by mouth daily.      [name] ibuprofen (ADVIL,MOTRIN) 200 MG tablet Take 400 mg by mouth every 6 (six) hours as needed.      . warfarin (COUMADIN) 2.5 MG tablet Take as directed by Anticoagulation clinic  70 tablet  2  . WITCH HAZEL EX Apply 1 application topically as needed.       No current facility-administered medications for this visit.     Past Medical History  Diagnosis Date  . Asthma     "as a baby"   . Anemia   . History of blood transfusion     "14 w/1st pregnancy; 2 w/last C-section" (03/15/2013)  . GERD (gastroesophageal reflux disease)   . Hypertension     no pcp   will go to [facility]  . Severe mitral regurgitation 04/22/2013  . SVT (supraventricular tachycardia) 03/16/2013  . Acute diastolic heart failure 2/70/3500  . 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 Tricuspid regurgitation   . S/P mitral valve replacement with metallic valve 9/38/1829    80mm Sorin Carbomedics mechanical prosthesis via right mini thoracotomy approach  . 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    Complex valvuloplasty including Cor-matrix ECM patch augmentation of anterior and lateral leaflets with 77mm Edwards mc3 ring annuloplasty via right mini-thoracotomy approach  . Complete heart block 05/28/2013    Post-op  . Epigastric hernia 200's    ROS:   All systems reviewed and  negative except as noted in the HPI.   Past Surgical History  Procedure Laterality Date  . Appendectomy  ~1978  . Cesarean section  1983; 1999  . Laparoscopic cholecystectomy  2003  . Cesarean section with bilateral tubal ligation  1999  . Tee without cardioversion N/A 03/18/2013    Procedure: TRANSESOPHAGEAL ECHOCARDIOGRAM (TEE);  Surgeon: Larey Dresser, MD;  Location: [facility];  Service: Cardiovascular;  Laterality: N/A;  . Cardiac catheterization    . Multiple extractions with alveoloplasty N/A 04/04/2013    Procedure: Extraction of tooth #'s 1,8,9,13,14,15,23,24,25,26 with alveoloplasty and gross debridement of remaining teeth;  Surgeon: Lenn Cal, DDS;  Location: [facility];  Service: Oral Surgery;  Laterality: N/A;  . Minimally invasive tricuspid valve repair Right 05/23/2013    Procedure: MINIMALLY INVASIVE TRICUSPID VALVE REPAIR;  Surgeon: Rexene Alberts, MD;  Location: [facility];  Service: Open Heart Surgery;  Laterality: Right;  . Intraoperative transesophageal echocardiogram N/A 05/23/2013    Procedure: INTRAOPERATIVE TRANSESOPHAGEAL ECHOCARDIOGRAM;  Surgeon: Rexene Alberts, MD;  Location: [facility];  Service: Open Heart Surgery;  Laterality: N/A;  . Femoral hernia repair Right 05/23/2013    Procedure: HERNIA REPAIR FEMORAL;  Surgeon: Rexene Alberts, MD;  Location: [facility];  Service: Open Heart Surgery;  Laterality: Right;  . Mitral valve replacement N/A 05/23/2013    Procedure: MITRAL VALVE (MV) REPLACEMENT;  Surgeon:  Rexene Alberts, MD;  Location: [facility];  Service: Open Heart Surgery;  Laterality: N/A;  . Tee without cardioversion N/A 06/17/2013    Procedure: TRANSESOPHAGEAL ECHOCARDIOGRAM (TEE);  Surgeon: Lelon Perla, MD;  Location: [facility] ENDOSCOPY;  Service: Cardiovascular;  Laterality: N/A;     Family History  Problem Relation Age of Onset  . Sarcoidosis Sister      History   Social History  . Marital Status: Single    Spouse Name: N/A    Number of Children: N/A  .  Years of Education: N/A   Occupational History  . Not on file.   Social History Main Topics  . Smoking status: Current Every Day Smoker -- 0.50 packs/day for 36 years    Types: Cigarettes    Last Attempt to Quit: 03/15/2013  . Smokeless tobacco: Never Used  . Alcohol Use: 3.6 oz/week    6 Glasses of wine per week     Comment: 03/15/2013 "bottle of wine/wk"  . Drug Use: No  . Sexual Activity: No   Other Topics Concern  . Not on file   Social History Narrative  . No narrative on file     BP 128/88  Pulse 105  Ht 5\' 2"  (1.575 m)  Wt 133 lb 12.8 oz (60.691 kg)  BMI 24.47 kg/m2  LMP 01/28/2013  Physical Exam:  Well appearing middle aged woman, NAD HEENT: Unremarkable Neck:  No JVD, no thyromegally Back:  No CVA tenderness Lungs:  Clear with no wheezes HEART:  Regular rate rhythm, soft systolic murmur, no rubs, no clicks, mechanic valve closure Abd:  soft, positive bowel sounds, no organomegally, no rebound, no guarding Ext:  2 plus pulses, no edema, no cyanosis, no clubbing Skin:  No rashes no nodules Neuro:  CN II through XII intact, motor grossly intact  EKG - NSR/sinus tachy with RAE   Assess/Plan:

## 2014-04-01 NOTE — Patient Instructions (Signed)
Your physician recommends that you schedule a follow-up appointment  As needed with Dr. Lovena Le

## 2014-04-01 NOTE — Assessment & Plan Note (Signed)
She describes no symptomatic SVT. Will follow.

## 2014-04-03 ENCOUNTER — Encounter: Payer: Self-pay | Admitting: Cardiology

## 2014-04-23 ENCOUNTER — Ambulatory Visit (INDEPENDENT_AMBULATORY_CARE_PROVIDER_SITE_OTHER): Payer: Self-pay | Admitting: *Deleted

## 2014-04-23 DIAGNOSIS — Z954 Presence of other heart-valve replacement: Secondary | ICD-10-CM

## 2014-04-23 DIAGNOSIS — Z7901 Long term (current) use of anticoagulants: Secondary | ICD-10-CM

## 2014-04-23 DIAGNOSIS — I059 Rheumatic mitral valve disease, unspecified: Secondary | ICD-10-CM

## 2014-04-23 LAB — POCT INR: INR: 3.5

## 2014-05-07 ENCOUNTER — Ambulatory Visit (INDEPENDENT_AMBULATORY_CARE_PROVIDER_SITE_OTHER): Payer: Self-pay | Admitting: *Deleted

## 2014-05-07 DIAGNOSIS — Z954 Presence of other heart-valve replacement: Secondary | ICD-10-CM

## 2014-05-07 DIAGNOSIS — Z7901 Long term (current) use of anticoagulants: Secondary | ICD-10-CM

## 2014-05-07 DIAGNOSIS — I059 Rheumatic mitral valve disease, unspecified: Secondary | ICD-10-CM

## 2014-05-07 LAB — POCT INR: INR: 4.1

## 2014-05-12 ENCOUNTER — Ambulatory Visit: Payer: Medicaid Other | Admitting: Thoracic Surgery (Cardiothoracic Vascular Surgery)

## 2014-05-26 ENCOUNTER — Ambulatory Visit: Payer: Medicaid Other | Admitting: Thoracic Surgery (Cardiothoracic Vascular Surgery)

## 2014-06-09 ENCOUNTER — Ambulatory Visit: Payer: Medicaid Other | Admitting: Thoracic Surgery (Cardiothoracic Vascular Surgery)

## 2014-06-10 ENCOUNTER — Encounter: Payer: Self-pay | Admitting: Thoracic Surgery (Cardiothoracic Vascular Surgery)

## 2014-06-10 ENCOUNTER — Ambulatory Visit (INDEPENDENT_AMBULATORY_CARE_PROVIDER_SITE_OTHER): Payer: Medicaid Other | Admitting: Thoracic Surgery (Cardiothoracic Vascular Surgery)

## 2014-06-10 ENCOUNTER — Ambulatory Visit (INDEPENDENT_AMBULATORY_CARE_PROVIDER_SITE_OTHER): Payer: Self-pay | Admitting: *Deleted

## 2014-06-10 VITALS — BP 113/73 | HR 108 | Ht 62.0 in | Wt 133.0 lb

## 2014-06-10 DIAGNOSIS — Z9889 Other specified postprocedural states: Secondary | ICD-10-CM

## 2014-06-10 DIAGNOSIS — I059 Rheumatic mitral valve disease, unspecified: Secondary | ICD-10-CM

## 2014-06-10 DIAGNOSIS — Z7901 Long term (current) use of anticoagulants: Secondary | ICD-10-CM

## 2014-06-10 DIAGNOSIS — Z954 Presence of other heart-valve replacement: Secondary | ICD-10-CM

## 2014-06-10 LAB — POCT INR: INR: 5.4

## 2014-06-10 MED ORDER — WARFARIN SODIUM 5 MG PO TABS: 5.0000 mg | ORAL_TABLET | ORAL | Status: DC

## 2014-06-10 NOTE — Progress Notes (Signed)
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 She also underwent repair of incarcerated right femoral hernia at the same time. Early postoperatively the patient had complete heart block, but this resolved without need for permanent pacemaker placement. She was last seen here in our office on 11/11/2013.  She was seen in followup by Dr. Aundra Dubin in June. Followup blood work normal. Transthoracic echocardiogram performed 04/01/2014 revealed normal functioning mechanical prosthesis in the mitral position with normal left ventricular function and ejection fraction estimated 60-65%. There was no significant tricuspid regurgitation. No other significant abnormalities were noted. The patient underwent a 48 hour Holter monitor that reportedly demonstrated intermittent periods of complete heart block which occurs primarily when the patient was sleeping. She was referred for EP service consultation and evaluated by Dr. Lovena Le.  The patient was felt to be asymptomatic and plans were made to continue to observe her for the time being.  Clinically the patient has done fairly well, although over the past 6 months she has gained some weight and developed mild exertional shortness of breath. She recently had a brief dizzy spell without syncope.  She states that she gets short of breath primarily only with relatively strenuous activity, and dyspnea only slightly limits her daily activities. She denies any resting shortness of breath, PND, orthopnea, or lower extremity edema. She has had some tachypalpitations.   Current  Outpatient Prescriptions  Medication Sig Dispense Refill  . aspirin EC 81 MG tablet Take 1 tablet (81 mg total) by mouth daily.      [name] warfarin (COUMADIN) 2.5 MG tablet Take as directed by Anticoagulation clinic  70 tablet  2  . ibuprofen (ADVIL,MOTRIN) 200 MG tablet Take 400 mg by mouth every 6 (six) hours as needed.      . WITCH HAZEL EX Apply 1 application topically as needed.       No current facility-administered medications for this visit.      Physical Exam:   BP 113/73  Pulse 108  Ht 5\' 2"  (1.575 m)  Wt 133 lb (60.328 kg)  BMI 24.32 kg/m2  SpO2 98%  LMP 01/28/2013  General:  Well-appearing  Chest:   clear  CV:   Regular rate and rhythm with mechanical heart sounds, no murmur appreciated  Incisions:  Completely healed  Abdomen:  Soft and nontender  Extremities:  Warm and well-perfused  Diagnostic Tests:  Transthoracic Echocardiography  Patient: Mary [name], Mary [name] MR #: [id number] Study Date: 04/01/2014 Gender: F Age: 53 Height: 157.5 cm Weight: 59.9 kg BSA: 1.63 m^2 Pt. Status: Room:  SONOGRAPHER Blondell Reveal ATTENDING Loralie Champagne, M.D. ORDERING Loralie Champagne, M.D. REFERRING Loralie Champagne, M.D. PERFORMING [facility], Outpatient  cc:  ------------------------------------------------------------------- LV EF: 60% - 65%  ------------------------------------------------------------------- Indications: 424.0 Mitral valve disease.  ------------------------------------------------------------------- History: PMH: MVR. Palpitations. Acquired from the patient and from the patient&'s chart. Dyspnea.  ------------------------------------------------------------------- Study Conclusions  - Left ventricle: The cavity size was normal. There was mild concentric hypertrophy. Systolic function was normal. The estimated ejection fraction was in the range of 60% to  65%. Wall motion was normal; there were no regional wall motion abnormalities. Doppler parameters are  consistent with abnormal left ventricular relaxation (grade 1 diastolic dysfunction). - Aortic valve: Trileaflet; normal thickness leaflets. There was no regurgitation. - Aortic root: The aortic root was normal in size. - Mitral valve: S/P MVR (St Jude mechanical valve) and appears functioning well. Mild central mitral regurgitation, no paravalvular leak. Normal transmitral gradients. Mean gradient (D): 5 mm Hg. - Left atrium: The atrium was mildly dilated. - Right ventricle: Systolic function was mildly reduced. - Pulmonary arteries: Systolic pressure can&'t be accurately assessed.  Transthoracic echocardiography. M-mode, complete 2D, spectral Doppler, and color Doppler. Birthdate: Patient birthdate: June 20, 1961. Age: Patient is 53 yr old. Sex: Gender: female. Height: Height: 157.5 cm. Height: 62 in. Weight: Weight: 59.9 kg. Weight: 131.7 lb. Body mass index: BMI: 24.1 kg/m^2. Body surface area: BSA: 1.63 m^2. Blood pressure: 125/79 Patient status: Outpatient. Study date: Study date: 04/01/2014. Study time: 10:09 AM. Location: [facility]  -------------------------------------------------------------------  ------------------------------------------------------------------- Left ventricle: The cavity size was normal. There was mild concentric hypertrophy. Systolic function was normal. The estimated ejection fraction was in the range of 60% to 65%. Wall motion was normal; there were no regional wall motion abnormalities. Doppler parameters are consistent with abnormal left ventricular relaxation (grade 1 diastolic dysfunction).  ------------------------------------------------------------------- Aortic valve: Trileaflet; normal thickness leaflets. Mobility was not restricted. Doppler: Transvalvular velocity was within the normal range. There was no stenosis. There was no regurgitation.  ------------------------------------------------------------------- Aorta: Aortic root:  The aortic root was normal in size.  ------------------------------------------------------------------- Mitral valve: S/P MVR (St Jude mechanical valve) and appears functioning well. Mild central mitral regurgitation, no paravalvular leak. Normal transmitral gradients. Mobility was not restricted. Doppler: Transvalvular velocity was within the normal range. There was no evidence for stenosis. There was no regurgitation. Valve area by continuity equation (using LVOT flow): 1.33 cm^2. Indexed valve area by continuity equation (using LVOT flow): 0.82 cm^2/m^2. Mean gradient (D): 5 mm Hg.  ------------------------------------------------------------------- Left atrium: The atrium was mildly dilated.  ------------------------------------------------------------------- Right ventricle: The cavity size was normal. Wall thickness was normal. Systolic function was mildly reduced.  ------------------------------------------------------------------- Pulmonic valve: Structurally normal valve. Cusp separation was normal. Doppler: Transvalvular velocity was within the normal range. There was no evidence for stenosis. There was no regurgitation.  ------------------------------------------------------------------- Tricuspid valve: Structurally normal valve. Doppler: Transvalvular velocity was within the normal range. There was no regurgitation.  ------------------------------------------------------------------- Pulmonary artery: The main pulmonary artery was normal-sized. Systolic pressure can&'t be accurately assessed.  ------------------------------------------------------------------- Right atrium: The atrium was normal in size.  ------------------------------------------------------------------- Pericardium: There was no pericardial effusion.  ------------------------------------------------------------------- Systemic veins: Inferior vena cava: The vessel was normal in  size.  ------------------------------------------------------------------- Prepared and Electronically Authenticated by  Ena Dawley, M.D. 2015-07-07T18:10:08  ------------------------------------------------------------------- Measurements  Left ventricle Value 06/18/2013 Reference LV ID, ED, PLAX (L) 31.1 mm 19.6 43 - 52 chordal LV ID, ES, PLAX (L) 20.9 mm 14.4 23 - 38 chordal LV fx shortening, PLAX (N) 33 % 27 >=29 chordal LV PW thickness, ED 10.8 mm 8.11 --------- IVS/LV PW ratio, ED (N) 1.16 0.99 <=1.3 Stroke volume, 2D 27 ml ---------- --------- Stroke volume/bsa, 2D 17 ml/m^2 ---------- --------- LV e&', lateral 7.02 cm/s ---------- --------- LV E/e&', lateral 11.81 ---------- --------- LV e&', medial 6.43 cm/s ---------- --------- LV E/e&', medial 12.89 ---------- --------- LV e&', average 6.73 cm/s ---------- --------- LV E/e&', average 12.33 ---------- ---------  Ventricular septum Value 06/18/2013 Reference IVS thickness, ED 12.5 mm 8.05 ---------  LVOT Value 06/18/2013 Reference LVOT ID, S 18 mm ---------- --------- LVOT area 2.54 cm^2 ---------- --------- LVOT ID 18 mm ---------- --------- LVOT VTI, S 10.6 cm ---------- --------- Stroke volume (SV), 27 ml ---------- --------- LVOT DP Stroke index (SV/bsa), 16.6 ml/m^2 ---------- --------- LVOT DP  Aorta Value 06/18/2013 Reference Aortic root ID, ED 27 mm 25 --------- Ascending aorta ID, 29 mm ---------- --------- A-P, S  Left atrium Value 06/18/2013 Reference LA ID, A-P, ES 32 mm 35 --------- LA ID/bsa, A-P (N) 1.97 cm/m^2 2.48 <=2.2  Mitral valve Value 06/18/2013 Reference Mitral E-wave peak 82.9 cm/s ---------- --------- velocity Mitral A-wave peak 128 cm/s ---------- --------- velocity Mitral mean velocity, 108 cm/s ---------- --------- D Mitral deceleration (H) 283 ms ---------- 150 - 230 time Mitral mean gradient, 5 mm Hg ---------- --------- D Mitral E/A ratio, peak 0.6 ----------  --------- Mitral valve area, 1.33 cm^2 ---------- --------- LVOT continuity Mitral valve area/bsa, 0.82 cm^2/m^2 ---------- --------- LVOT continuity Mitral annulus VTI, D 20.3 cm ---------- ---------  Pulmonary arteries Value 06/18/2013 Reference PA pressure, S, DP (N) 18 mm Hg ---------- <=30  Tricuspid valve Value 06/18/2013 Reference Tricuspid regurg peak 195 cm/s 222 --------- velocity Tricuspid peak RV-RA 15 mm Hg 20 --------- gradient Tricuspid maximal 195 cm/s ---------- --------- regurg velocity, PISA  Systemic veins Value 06/18/2013 Reference Estimated CVP 3 mm Hg 7 ---------  Right ventricle Value 06/18/2013 Reference RV pressure, S, DP (N) 18 mm Hg 27 <=30 RV s&', lateral, S 9.16 cm/s ---------- ---------  Legend: (L) and (H) mark values outside specified reference range.  (N) marks values inside specified reference range.    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 Postoperatively she developed complete heart block which appeared to have resolved completely, although recent 48 hour Holter monitor demonstrated intermittent periods of complete heart block which occurred while the patient was sleeping. The patient describes stable symptoms of exertional shortness of breath consistent with chronic diastolic congestive heart failure, New York Heart Association functional class II. She recently had a dizzy spell without syncope. She has had frequent tachypalpitations.    Plan:  The patient will continue to followup with Dr. Aundra Dubin and Dr. Lovena Le to discuss whether or not permanent pacemaker placement might be prudent.  She has been reminded regarding the need for lifelong antibiotic prophylaxis for all dental cleaning and related procedures. All of her questions been addressed. In the future  she will call and return to see [place] as needed.    I spent in excess of 15 minutes during the conduct of this office consultation and >50% of this time involved direct face-to-face encounter with the patient for counseling and/or coordination of their care.   Mary Gu. Roxy Manns, MD 06/10/2014 3:53 PM

## 2014-06-10 NOTE — Patient Instructions (Signed)

## 2014-06-20 ENCOUNTER — Ambulatory Visit (INDEPENDENT_AMBULATORY_CARE_PROVIDER_SITE_OTHER): Payer: Self-pay | Admitting: *Deleted

## 2014-06-20 DIAGNOSIS — Z7901 Long term (current) use of anticoagulants: Secondary | ICD-10-CM

## 2014-06-20 DIAGNOSIS — Z954 Presence of other heart-valve replacement: Secondary | ICD-10-CM

## 2014-06-20 DIAGNOSIS — I059 Rheumatic mitral valve disease, unspecified: Secondary | ICD-10-CM

## 2014-06-20 LAB — POCT INR: INR: 1.3

## 2014-07-07 ENCOUNTER — Ambulatory Visit: Payer: Self-pay | Attending: Cardiology

## 2014-07-14 ENCOUNTER — Encounter (HOSPITAL_COMMUNITY): Payer: Self-pay | Admitting: Emergency Medicine

## 2014-07-14 ENCOUNTER — Emergency Department (HOSPITAL_COMMUNITY)
Admission: EM | Admit: 2014-07-14 | Discharge: 2014-07-14 | Disposition: A | Discharge status: Home / Self Care | Payer: Self-pay | Attending: Emergency Medicine | Admitting: Emergency Medicine

## 2014-07-14 ENCOUNTER — Emergency Department (HOSPITAL_COMMUNITY): Payer: Self-pay

## 2014-07-14 DIAGNOSIS — Z79899 Other long term (current) drug therapy: Secondary | ICD-10-CM | POA: Insufficient documentation

## 2014-07-14 DIAGNOSIS — Z720 Tobacco use: Secondary | ICD-10-CM | POA: Insufficient documentation

## 2014-07-14 DIAGNOSIS — Z862 Personal history of diseases of the blood and blood-forming organs and certain disorders involving the immune mechanism: Secondary | ICD-10-CM | POA: Insufficient documentation

## 2014-07-14 DIAGNOSIS — J45901 Unspecified asthma with (acute) exacerbation: Secondary | ICD-10-CM | POA: Insufficient documentation

## 2014-07-14 DIAGNOSIS — Z9889 Other specified postprocedural states: Secondary | ICD-10-CM | POA: Insufficient documentation

## 2014-07-14 DIAGNOSIS — R197 Diarrhea, unspecified: Secondary | ICD-10-CM | POA: Insufficient documentation

## 2014-07-14 DIAGNOSIS — I052 Rheumatic mitral stenosis with insufficiency: Secondary | ICD-10-CM | POA: Insufficient documentation

## 2014-07-14 DIAGNOSIS — N390 Urinary tract infection, site not specified: Secondary | ICD-10-CM

## 2014-07-14 DIAGNOSIS — R079 Chest pain, unspecified: Secondary | ICD-10-CM

## 2014-07-14 DIAGNOSIS — R109 Unspecified abdominal pain: Secondary | ICD-10-CM | POA: Insufficient documentation

## 2014-07-14 DIAGNOSIS — I10 Essential (primary) hypertension: Secondary | ICD-10-CM | POA: Insufficient documentation

## 2014-07-14 DIAGNOSIS — Z8719 Personal history of other diseases of the digestive system: Secondary | ICD-10-CM | POA: Insufficient documentation

## 2014-07-14 DIAGNOSIS — R531 Weakness: Secondary | ICD-10-CM | POA: Insufficient documentation

## 2014-07-14 DIAGNOSIS — R112 Nausea with vomiting, unspecified: Secondary | ICD-10-CM

## 2014-07-14 DIAGNOSIS — Z954 Presence of other heart-valve replacement: Secondary | ICD-10-CM | POA: Insufficient documentation

## 2014-07-14 DIAGNOSIS — I5031 Acute diastolic (congestive) heart failure: Secondary | ICD-10-CM | POA: Insufficient documentation

## 2014-07-14 DIAGNOSIS — Z7901 Long term (current) use of anticoagulants: Secondary | ICD-10-CM | POA: Insufficient documentation

## 2014-07-14 DIAGNOSIS — Y92099 Unspecified place in other non-institutional residence as the place of occurrence of the external cause: Secondary | ICD-10-CM | POA: Insufficient documentation

## 2014-07-14 DIAGNOSIS — Z9089 Acquired absence of other organs: Secondary | ICD-10-CM | POA: Insufficient documentation

## 2014-07-14 LAB — COMPREHENSIVE METABOLIC PANEL
ALBUMIN: 4.1 g/dL (ref 3.5–5.2)
ALT: 13 U/L (ref 0–35)
AST: 29 U/L (ref 0–37)
Alkaline Phosphatase: 150 U/L — ABNORMAL HIGH (ref 39–117)
Anion gap: 17 — ABNORMAL HIGH (ref 5–15)
BUN: 12 mg/dL (ref 6–23)
CALCIUM: 10 mg/dL (ref 8.4–10.5)
CO2: 24 meq/L (ref 19–32)
CREATININE: 1.39 mg/dL — AB (ref 0.50–1.10)
Chloride: 98 mEq/L (ref 96–112)
GFR calc Af Amer: 50 mL/min — ABNORMAL LOW (ref >90)
GFR, EST NON AFRICAN AMERICAN: 43 mL/min — AB (ref >90)
Glucose, Bld: 89 mg/dL (ref 70–99)
Potassium: 4.5 mEq/L (ref 3.7–5.3)
Sodium: 139 mEq/L (ref 137–147)
Total Bilirubin: 0.5 mg/dL (ref 0.3–1.2)
Total Protein: 9.1 g/dL — ABNORMAL HIGH (ref 6.0–8.3)

## 2014-07-14 LAB — CBC WITH DIFFERENTIAL/PLATELET
BASOS PCT: 0 % (ref 0–1)
Basophils Absolute: 0 10*3/uL (ref 0.0–0.1)
EOS PCT: 3 % (ref 0–5)
Eosinophils Absolute: 0.3 10*3/uL (ref 0.0–0.7)
HEMATOCRIT: 42.8 % (ref 36.0–46.0)
Hemoglobin: 15 g/dL (ref 12.0–15.0)
Lymphocytes Relative: 12 % (ref 12–46)
Lymphs Abs: 1.2 10*3/uL (ref 0.7–4.0)
MCH: 32.9 pg (ref 26.0–34.0)
MCHC: 35 g/dL (ref 30.0–36.0)
MCV: 93.9 fL (ref 78.0–100.0)
MONO ABS: 0.5 10*3/uL (ref 0.1–1.0)
Monocytes Relative: 5 % (ref 3–12)
Neutro Abs: 7.8 10*3/uL — ABNORMAL HIGH (ref 1.7–7.7)
Neutrophils Relative %: 80 % — ABNORMAL HIGH (ref 43–77)
Platelets: 460 10*3/uL — ABNORMAL HIGH (ref 150–400)
RBC: 4.56 MIL/uL (ref 3.87–5.11)
RDW: 13.8 % (ref 11.5–15.5)
WBC: 9.8 10*3/uL (ref 4.0–10.5)

## 2014-07-14 LAB — URINALYSIS, ROUTINE W REFLEX MICROSCOPIC
Glucose, UA: NEGATIVE mg/dL
KETONES UR: 15 mg/dL — AB
NITRITE: NEGATIVE
Protein, ur: >300 mg/dL — AB
Specific Gravity, Urine: 1.02 (ref 1.005–1.030)
UROBILINOGEN UA: 0.2 mg/dL (ref 0.0–1.0)
pH: 5.5 (ref 5.0–8.0)

## 2014-07-14 LAB — URINE MICROSCOPIC-ADD ON

## 2014-07-14 LAB — PROTIME-INR
INR: 2.97 — ABNORMAL HIGH (ref 0.00–1.49)
Prothrombin Time: 31.1 seconds — ABNORMAL HIGH (ref 11.6–15.2)

## 2014-07-14 LAB — PRO B NATRIURETIC PEPTIDE: PRO B NATRI PEPTIDE: 610.5 pg/mL — AB (ref 0–125)

## 2014-07-14 LAB — LIPASE, BLOOD: LIPASE: 28 U/L (ref 11–59)

## 2014-07-14 LAB — TROPONIN I

## 2014-07-14 MED ORDER — ONDANSETRON HCL 4 MG/2ML IJ SOLN
4.0000 mg | Freq: Once | INTRAMUSCULAR | Status: AC
  Administered 2014-07-14: 4 mg via INTRAVENOUS
  Filled 2014-07-14: qty 2

## 2014-07-14 MED ORDER — DEXTROSE 5 % IV SOLN
1.0000 g | Freq: Once | INTRAVENOUS | Status: AC
  Administered 2014-07-14: 1 g via INTRAVENOUS
  Filled 2014-07-14: qty 10

## 2014-07-14 MED ORDER — CEPHALEXIN 500 MG PO CAPS: 500.0000 mg | ORAL_CAPSULE | Freq: Two times a day (BID) | ORAL | Status: DC

## 2014-07-14 MED ORDER — SODIUM CHLORIDE 0.9 % IV BOLUS (SEPSIS)
1000.0000 mL | Freq: Once | INTRAVENOUS | Status: AC
  Administered 2014-07-14: 1000 mL via INTRAVENOUS

## 2014-07-14 NOTE — ED Notes (Signed)
Pt returned from X Ray.

## 2014-07-14 NOTE — Discharge Planning (Signed)
[place] Specialist  Patient is a current orange card holder at [facility]. Patient states she hasn't been seen by the practice since receiving the orange card and cone discount. I attempted to make a follow up appointment for the patient, I was instructed by the practice to inform the patient to call first thing in the morning for a new patient appointment. Information given to the patient, pt verbalized understanding. My contact information provided for any future questions or concerns. No other [place] Specialist needs identified at this time.

## 2014-07-14 NOTE — Discharge Instructions (Signed)
As discussed, it is important that you follow up as soon as possible with your physician for continued management of your condition. ° °If you develop any new, or concerning changes in your condition, please return to the emergency department immediately. ° °

## 2014-07-14 NOTE — ED Notes (Signed)
Patient transported to X-ray 

## 2014-07-14 NOTE — ED Notes (Signed)
Pt from home via [facility] with c/o shortness of breath and intermittent chest pain since yesterday.  Pt also reports N/V/D starting yesterday and palpitations.  Hx of valve replacement.  Pt in NAD, A&O.

## 2014-07-14 NOTE — ED Provider Notes (Signed)
CSN: [id number]     Arrival date & time 07/14/14  1029 History   First MD Initiated Contact with Patient 07/14/14 1036     Chief Complaint  Patient presents with  . Chest Pain  . Shortness of Breath     (Consider location/radiation/quality/duration/timing/severity/associated sxs/prior Treatment) HPI Patient presents with multiple complaints.  Patient has baseline dyspnea, and over the past days has had occasional worsening, without clear precipitant. Patient also complains of nausea, vomiting, diarrhea, occasional abdominal pain, occasional chest pain, occasional palpitations. Since onset, no clear alleviating or exacerbating factors. Currently the patient has no chest pain, no abdominal pain. She is nauseous. She has a history of mitral valve replacement, is on chronic anticoagulation. Past Medical History  Diagnosis Date  . Asthma     "as a baby"   . Anemia   . History of blood transfusion     "14 w/1st pregnancy; 2 w/last C-section" (03/15/2013)  . GERD (gastroesophageal reflux disease)   . Hypertension     no pcp   will go to [facility]  . Severe mitral regurgitation 04/22/2013  . SVT (supraventricular tachycardia) 03/16/2013  . Acute diastolic heart failure 7/61/6073  . 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 Tricuspid regurgitation   . S/P mitral valve replacement with metallic valve 04/04/6268    67mm Sorin Carbomedics mechanical prosthesis via right mini thoracotomy approach  . 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    Complex valvuloplasty including Cor-matrix ECM patch augmentation of anterior and lateral leaflets with 4mm Edwards mc3 ring annuloplasty via right mini-thoracotomy approach  . Complete heart block 05/28/2013    Post-op  . Epigastric hernia 200's   Past Surgical History  Procedure Laterality Date  . Appendectomy  ~1978  . Cesarean section  1983; 1999  . Laparoscopic cholecystectomy  2003  . Cesarean section with bilateral tubal ligation  1999   . Tee without cardioversion N/A 03/18/2013    Procedure: TRANSESOPHAGEAL ECHOCARDIOGRAM (TEE);  Surgeon: Larey Dresser, MD;  Location: [facility];  Service: Cardiovascular;  Laterality: N/A;  . Cardiac catheterization    . Multiple extractions with alveoloplasty N/A 04/04/2013    Procedure: Extraction of tooth #'s 1,8,9,13,14,15,23,24,25,26 with alveoloplasty and gross debridement of remaining teeth;  Surgeon: Lenn Cal, DDS;  Location: [facility];  Service: Oral Surgery;  Laterality: N/A;  . Minimally invasive tricuspid valve repair Right 05/23/2013    Procedure: MINIMALLY INVASIVE TRICUSPID VALVE REPAIR;  Surgeon: Rexene Alberts, MD;  Location: [facility];  Service: Open Heart Surgery;  Laterality: Right;  . Intraoperative transesophageal echocardiogram N/A 05/23/2013    Procedure: INTRAOPERATIVE TRANSESOPHAGEAL ECHOCARDIOGRAM;  Surgeon: Rexene Alberts, MD;  Location: [facility];  Service: Open Heart Surgery;  Laterality: N/A;  . Femoral hernia repair Right 05/23/2013    Procedure: HERNIA REPAIR FEMORAL;  Surgeon: Rexene Alberts, MD;  Location: [facility];  Service: Open Heart Surgery;  Laterality: Right;  . Mitral valve replacement N/A 05/23/2013    Procedure: MITRAL VALVE (MV) REPLACEMENT;  Surgeon: Rexene Alberts, MD;  Location: [facility];  Service: Open Heart Surgery;  Laterality: N/A;  . Tee without cardioversion N/A 06/17/2013    Procedure: TRANSESOPHAGEAL ECHOCARDIOGRAM (TEE);  Surgeon: Lelon Perla, MD;  Location: [facility] ENDOSCOPY;  Service: Cardiovascular;  Laterality: N/A;   Family History  Problem Relation Age of Onset  . Sarcoidosis Sister    History  Substance Use Topics  . Smoking status: Current Every Day Smoker -- 0.50 packs/day for 36 years  Types: Cigarettes    Last Attempt to Quit: 03/15/2013  . Smokeless tobacco: Never Used  . Alcohol Use: 3.6 oz/week    6 Glasses of wine per week     Comment: 03/15/2013 "bottle of wine/wk"   OB History   Grav Para Term Preterm Abortions TAB  SAB Ect Mult Living                 Review of Systems  Constitutional:       Per HPI, otherwise negative  HENT:       Per HPI, otherwise negative  Respiratory:       Per HPI, otherwise negative  Cardiovascular:       Per HPI, otherwise negative  Gastrointestinal: Positive for nausea, vomiting, abdominal pain and diarrhea.  Endocrine:       Negative aside from HPI  Genitourinary:       Neg aside from HPI   Musculoskeletal:       Per HPI, otherwise negative  Skin: Negative.   Neurological: Positive for weakness. Negative for syncope.      Allergies  Oxycodone  Home Medications   Prior to Admission medications   Medication Sig Start Date End Date Taking? Authorizing Provider  aspirin EC 81 MG tablet Take 1 tablet (81 mg total) by mouth daily. 08/26/13   Larey Dresser, MD  ibuprofen (ADVIL,MOTRIN) 200 MG tablet Take 400 mg by mouth every 6 (six) hours as needed.    Historical Provider, MD  warfarin (COUMADIN) 5 MG tablet Take 1 tablet (5 mg total) by mouth as directed. 06/10/14   Evans Lance, MD  [facility] HAZEL EX Apply 1 application topically as needed.    Historical Provider, MD   Temp(Src) 97.8 F (36.6 C) (Oral)  Ht 5\' 2"  (1.575 m)  Wt 141 lb (63.957 kg)  BMI 25.78 kg/m2  LMP 01/28/2013 Physical Exam  Nursing note and vitals reviewed. Constitutional: She is oriented to person, place, and time. She appears well-developed and well-nourished. No distress.  HENT:  Head: Normocephalic and atraumatic.  Eyes: Conjunctivae and EOM are normal.  Cardiovascular: Normal rate and regular rhythm.   Audible snap consistent with prior mitral valve replaced  Pulmonary/Chest: Effort normal and breath sounds normal. No stridor. No respiratory distress.  Abdominal: She exhibits no distension. There is no tenderness.  Musculoskeletal: She exhibits no edema.  Neurological: She is alert and oriented to person, place, and time. No cranial nerve deficit. She exhibits normal muscle tone.  Coordination normal.  Skin: Skin is warm and dry.  Psychiatric: She has a normal mood and affect.    ED Course  Procedures (including critical care time) Labs Review Labs Reviewed  CBC WITH DIFFERENTIAL - Abnormal; Notable for the following:    Platelets 460 (*)    Neutrophils Relative % 80 (*)    Neutro Abs 7.8 (*)    All other components within normal limits  COMPREHENSIVE METABOLIC PANEL - Abnormal; Notable for the following:    Creatinine, Ser 1.39 (*)    Total Protein 9.1 (*)    Alkaline Phosphatase 150 (*)    GFR calc non Af Amer 43 (*)    GFR calc Af Amer 50 (*)    Anion gap 17 (*)    All other components within normal limits  PRO B NATRIURETIC PEPTIDE - Abnormal; Notable for the following:    Pro B Natriuretic peptide (BNP) 610.5 (*)    All other components within normal limits  URINALYSIS, ROUTINE  W REFLEX MICROSCOPIC - Abnormal; Notable for the following:    Color, Urine RED (*)    APPearance TURBID (*)    Hgb urine dipstick LARGE (*)    Bilirubin Urine MODERATE (*)    Ketones, ur 15 (*)    Protein, ur >300 (*)    Leukocytes, UA MODERATE (*)    All other components within normal limits  PROTIME-INR - Abnormal; Notable for the following:    Prothrombin Time 31.1 (*)    INR 2.97 (*)    All other components within normal limits  URINE MICROSCOPIC-ADD ON - Abnormal; Notable for the following:    Squamous Epithelial / LPF MANY (*)    Bacteria, UA MANY (*)    Casts GRANULAR CAST (*)    All other components within normal limits  TROPONIN I  LIPASE, BLOOD    Imaging Review Dg Chest 2 View  07/14/2014   CLINICAL DATA:  Episodes of heart racing this morning between 2 a.m. and 6 a.m.; history of tricuspid and mitral valve replacement as well as supraventricular tachycardia ; long history of tobacco use  EXAM: CHEST  2 VIEW  COMPARISON:  PA and lateral chest x-ray of November 08, 2013  FINDINGS: The lungs are hyperinflated with hemidiaphragm flattening. There is no  focal infiltrate. The heart and pulmonary vascularity are normal. Mitral and tricuspid valve rings are present. The mediastinum is normal in width. There is no pleural effusion or pneumothorax. The bony thorax is unremarkable.  IMPRESSION: Hyperinflation consistent with COPD. There is no evidence of CHF nor other acute cardiopulmonary abnormality.   Electronically Signed   By: David  Martinique   On: 07/14/2014 12:40     EKG Interpretation   Date/Time:  Monday July 14 2014 10:36:03 EDT Ventricular Rate:  104 PR Interval:  154 QRS Duration: 97 QT Interval:  370 QTC Calculation: 487 R Axis:   -33 Text Interpretation:  Sinus tachycardia LAE, consider biatrial enlargement  Left axis deviation RSR' in V1 or V2, probably normal variant Borderline  prolonged QT interval Sinus tachycardia RSR' pattern in V1 Left axis  deviation QT prolonged ?[name] Abnormal ekg Confirmed by Carmin Muskrat  MD  [phone]) on 07/14/2014 1:18:04 PM      O2- 99%ra, nml Cardiac: 100's sinus tach, abn   1:18 PM Patient feeling better after initial fluid resuscitation. Patient is sitting upright, eating MDM   Patient presents with multiple complaints, predominately nausea, vomiting, diarrhea. Patient's labs demonstrate dehydration, possible urinary tract infection. No evidence for bacteremia, sepsis, ACS. Patient improved with fluid rehydration, was awake and alert, hemodynamically stable, tolerating oral intake, and after several hours of monitoring, with no change on cardiac monitor, patient was discharged in stable condition. Case management established outpatient followup for the patient.   Carmin Muskrat, MD 07/14/14 [phone]

## 2014-07-22 ENCOUNTER — Ambulatory Visit: Payer: Self-pay | Admitting: Internal Medicine

## 2014-08-27 ENCOUNTER — Telehealth: Payer: Self-pay | Admitting: Internal Medicine

## 2014-08-27 NOTE — Telephone Encounter (Signed)
New message    Left message on voice mail regarding flu shot.

## 2014-09-04 ENCOUNTER — Encounter (HOSPITAL_COMMUNITY): Payer: Self-pay | Admitting: Cardiovascular Disease

## 2014-11-25 ENCOUNTER — Encounter (HOSPITAL_COMMUNITY): Payer: Self-pay | Admitting: *Deleted

## 2014-11-25 ENCOUNTER — Emergency Department (HOSPITAL_COMMUNITY): Payer: Self-pay

## 2014-11-25 ENCOUNTER — Emergency Department (HOSPITAL_COMMUNITY)
Admission: EM | Admit: 2014-11-25 | Discharge: 2014-11-25 | Disposition: A | Discharge status: Home / Self Care | Payer: Self-pay | Attending: Emergency Medicine | Admitting: Emergency Medicine

## 2014-11-25 DIAGNOSIS — K921 Melena: Secondary | ICD-10-CM | POA: Insufficient documentation

## 2014-11-25 DIAGNOSIS — Z792 Long term (current) use of antibiotics: Secondary | ICD-10-CM | POA: Insufficient documentation

## 2014-11-25 DIAGNOSIS — K644 Residual hemorrhoidal skin tags: Secondary | ICD-10-CM | POA: Insufficient documentation

## 2014-11-25 DIAGNOSIS — I5031 Acute diastolic (congestive) heart failure: Secondary | ICD-10-CM | POA: Insufficient documentation

## 2014-11-25 DIAGNOSIS — R079 Chest pain, unspecified: Secondary | ICD-10-CM | POA: Insufficient documentation

## 2014-11-25 DIAGNOSIS — J45901 Unspecified asthma with (acute) exacerbation: Secondary | ICD-10-CM | POA: Insufficient documentation

## 2014-11-25 DIAGNOSIS — Z862 Personal history of diseases of the blood and blood-forming organs and certain disorders involving the immune mechanism: Secondary | ICD-10-CM | POA: Insufficient documentation

## 2014-11-25 DIAGNOSIS — Z7901 Long term (current) use of anticoagulants: Secondary | ICD-10-CM | POA: Insufficient documentation

## 2014-11-25 DIAGNOSIS — I10 Essential (primary) hypertension: Secondary | ICD-10-CM | POA: Insufficient documentation

## 2014-11-25 DIAGNOSIS — R002 Palpitations: Secondary | ICD-10-CM | POA: Insufficient documentation

## 2014-11-25 DIAGNOSIS — Z7982 Long term (current) use of aspirin: Secondary | ICD-10-CM | POA: Insufficient documentation

## 2014-11-25 DIAGNOSIS — R040 Epistaxis: Secondary | ICD-10-CM | POA: Insufficient documentation

## 2014-11-25 DIAGNOSIS — R0602 Shortness of breath: Secondary | ICD-10-CM

## 2014-11-25 DIAGNOSIS — Z9889 Other specified postprocedural states: Secondary | ICD-10-CM | POA: Insufficient documentation

## 2014-11-25 DIAGNOSIS — R42 Dizziness and giddiness: Secondary | ICD-10-CM | POA: Insufficient documentation

## 2014-11-25 DIAGNOSIS — Z720 Tobacco use: Secondary | ICD-10-CM | POA: Insufficient documentation

## 2014-11-25 LAB — PROTIME-INR
INR: 1.85 — AB (ref 0.00–1.49)
PROTHROMBIN TIME: 21.5 s — AB (ref 11.6–15.2)

## 2014-11-25 LAB — BRAIN NATRIURETIC PEPTIDE: B Natriuretic Peptide: 36.5 pg/mL (ref 0.0–100.0)

## 2014-11-25 LAB — CBC
HCT: 38.5 % (ref 36.0–46.0)
Hemoglobin: 13 g/dL (ref 12.0–15.0)
MCH: 33.4 pg (ref 26.0–34.0)
MCHC: 33.8 g/dL (ref 30.0–36.0)
MCV: 99 fL (ref 78.0–100.0)
PLATELETS: 419 10*3/uL — AB (ref 150–400)
RBC: 3.89 MIL/uL (ref 3.87–5.11)
RDW: 14.1 % (ref 11.5–15.5)
WBC: 5.9 10*3/uL (ref 4.0–10.5)

## 2014-11-25 LAB — POC OCCULT BLOOD, ED: Fecal Occult Bld: NEGATIVE

## 2014-11-25 LAB — BASIC METABOLIC PANEL
Anion gap: 11 (ref 5–15)
BUN: 7 mg/dL (ref 6–23)
CALCIUM: 9.2 mg/dL (ref 8.4–10.5)
CO2: 20 mmol/L (ref 19–32)
Chloride: 112 mmol/L (ref 96–112)
Creatinine, Ser: 0.84 mg/dL (ref 0.50–1.10)
GFR calc Af Amer: >90 mL/min (ref >90)
GFR, EST NON AFRICAN AMERICAN: 79 mL/min — AB (ref >90)
Glucose, Bld: 84 mg/dL (ref 70–99)
Potassium: 4 mmol/L (ref 3.5–5.1)
SODIUM: 143 mmol/L (ref 135–145)

## 2014-11-25 LAB — I-STAT TROPONIN, ED
TROPONIN I, POC: 0 ng/mL (ref 0.00–0.08)
Troponin i, poc: 0 ng/mL (ref 0.00–0.08)

## 2014-11-25 NOTE — ED Provider Notes (Signed)
CSN: [id number]     Arrival date & time 11/25/14  1034 History   First MD Initiated Contact with Patient 11/25/14 1039     Chief Complaint  Patient presents with  . Dizziness  . Palpitations  . Chest Pain  . Rectal Bleeding     (Consider location/radiation/quality/duration/timing/severity/associated sxs/prior Treatment) Patient is a 54 y.o. female presenting with dizziness, palpitations, chest pain, and hematochezia. The history is provided by the patient and medical records.  Dizziness Associated symptoms: chest pain and palpitations   Palpitations Associated symptoms: chest pain and dizziness   Chest Pain Associated symptoms: dizziness and palpitations   Rectal Bleeding Associated symptoms: dizziness     This is a 54 year old female with past medical history significant for hypertension, congestive heart failure, mitral regurgitation status post valve replacement, presenting to the ED for intermittent chest pain, shortness of breath, palpitations, and lightheadedness. She states symptoms have been intermittent over the past 3 weeks. States symptoms occur independent of exertional activities. No alleviating or exacerbating factors. States she feels the palpitations which offsets the chest pain and SOB.  Patient does not have history of A. fib. Patient has no hx of CAD or MI.  she quit smoking in October 2015.  Patient was previously followed by Dr. Aundra Dubin, states she has not seen him in quit some time.  Patient also notes she awoke this morning with nosebleed and has been seeing some blood in her stool. She states it is bright red in color.  Patient is on chronic Coumadin therapy status post valve replacement. She has never had a colonoscopy.  She has never had issues with excessive bleeding in the past.  Has not had recent INR checked.  VSS on arrival.   Past Medical History  Diagnosis Date  . Asthma     "as a baby"   . Anemia   . History of blood transfusion     "14 w/1st pregnancy;  2 w/last C-section" (03/15/2013)  . GERD (gastroesophageal reflux disease)   . Hypertension     no pcp   will go to [facility]  . Severe mitral regurgitation 04/22/2013  . SVT (supraventricular tachycardia) 03/16/2013  . Acute diastolic heart failure 01/02/8118  . 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 Tricuspid regurgitation   . S/P mitral valve replacement with metallic valve 1/47/8295    44mm Sorin Carbomedics mechanical prosthesis via right mini thoracotomy approach  . 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    Complex valvuloplasty including Cor-matrix ECM patch augmentation of anterior and lateral leaflets with 93mm Edwards mc3 ring annuloplasty via right mini-thoracotomy approach  . Complete heart block 05/28/2013    Post-op  . Epigastric hernia 200's   Past Surgical History  Procedure Laterality Date  . Appendectomy  ~1978  . Cesarean section  1983; 1999  . Laparoscopic cholecystectomy  2003  . Cesarean section with bilateral tubal ligation  1999  . Tee without cardioversion N/A 03/18/2013    Procedure: TRANSESOPHAGEAL ECHOCARDIOGRAM (TEE);  Surgeon: Larey Dresser, MD;  Location: [place];  Service: Cardiovascular;  Laterality: N/A;  . Cardiac catheterization    . Multiple extractions with alveoloplasty N/A 04/04/2013    Procedure: Extraction of tooth #'s 1,8,9,13,14,15,23,24,25,26 with alveoloplasty and gross debridement of remaining teeth;  Surgeon: Lenn Cal, DDS;  Location: [place];  Service: Oral Surgery;  Laterality: N/A;  . Minimally invasive tricuspid valve repair Right 05/23/2013    Procedure: MINIMALLY INVASIVE TRICUSPID VALVE REPAIR;  Surgeon: Rexene Alberts, MD;  Location: [facility];  Service: Open Heart Surgery;  Laterality: Right;  . Intraoperative transesophageal echocardiogram N/A 05/23/2013    Procedure: INTRAOPERATIVE TRANSESOPHAGEAL ECHOCARDIOGRAM;  Surgeon: Rexene Alberts, MD;  Location: [place];  Service: Open Heart Surgery;  Laterality: N/A;  .  Femoral hernia repair Right 05/23/2013    Procedure: HERNIA REPAIR FEMORAL;  Surgeon: Rexene Alberts, MD;  Location: [place];  Service: Open Heart Surgery;  Laterality: Right;  . Mitral valve replacement N/A 05/23/2013    Procedure: MITRAL VALVE (MV) REPLACEMENT;  Surgeon: Rexene Alberts, MD;  Location: [place];  Service: Open Heart Surgery;  Laterality: N/A;  . Tee without cardioversion N/A 06/17/2013    Procedure: TRANSESOPHAGEAL ECHOCARDIOGRAM (TEE);  Surgeon: Lelon Perla, MD;  Location: [facility]COPY;  Service: Cardiovascular;  Laterality: N/A;  . Left and right heart catheterization with coronary angiogram N/A 03/22/2013    Procedure: LEFT AND RIGHT HEART CATHETERIZATION WITH CORONARY ANGIOGRAM;  Surgeon: Burnell Blanks, MD;  Location: [facility] CATH LAB;  Service: Cardiovascular;  Laterality: N/A;   Family History  Problem Relation Age of Onset  . Sarcoidosis Sister    History  Substance Use Topics  . Smoking status: Current Every Day Smoker -- 0.50 packs/day for 36 years    Types: Cigarettes    Last Attempt to Quit: 03/15/2013  . Smokeless tobacco: Never Used  . Alcohol Use: 3.6 oz/week    6 Glasses of wine per week     Comment: 03/15/2013 "bottle of wine/wk"   OB History    No data available     Review of Systems  Cardiovascular: Positive for chest pain and palpitations.  Gastrointestinal: Positive for hematochezia.  Neurological: Positive for dizziness.  All other systems reviewed and are negative.     Allergies  Oxycodone  Home Medications   Prior to Admission medications   Medication Sig Start Date End Date Taking? Authorizing Provider  aspirin EC 81 MG tablet Take 1 tablet (81 mg total) by mouth daily. 08/26/13   Larey Dresser, MD  cephALEXin (KEFLEX) 500 MG capsule Take 1 capsule (500 mg total) by mouth 2 (two) times daily. 07/14/14   Carmin Muskrat, MD  ibuprofen (ADVIL,MOTRIN) 200 MG tablet Take 400 mg by mouth every 6 (six) hours as needed (for pain).      Historical Provider, MD  warfarin (COUMADIN) 5 MG tablet Take 5 mg by mouth daily.    Historical Provider, MD   BP 124/85 mmHg  Pulse 100  Temp(Src) 97.1 F (36.2 C) (Oral)  Resp 14  Ht 5\' 2"  (1.575 m)  Wt 139 lb (63.05 kg)  BMI 25.42 kg/m2  SpO2 100%  LMP 01/28/2013   Physical Exam  Constitutional: She is oriented to person, place, and time. She appears well-developed and well-nourished. No distress.  HENT:  Head: Normocephalic and atraumatic.  Mouth/Throat: Oropharynx is clear and moist.  Site with dried blood identified in left medial nare; no active bleeding; no septal deformity or hematoma  Eyes: Conjunctivae and EOM are normal. Pupils are equal, round, and reactive to light.  Neck: Normal range of motion. Neck supple.  Cardiovascular: Normal rate, regular rhythm and normal heart sounds.   Pulmonary/Chest: Effort normal and breath sounds normal. No respiratory distress. She has no wheezes.  Abdominal: Soft. Bowel sounds are normal. There is no tenderness. There is no guarding.  Genitourinary: Rectal exam shows external hemorrhoid. Guaiac negative stool.  Non-bleeding, non-thrombosed external hemorrhoids; no anal fissures or signs of abscess formation;  no internal masses noted; no gross blood, guaiac negative  Musculoskeletal: Normal range of motion. She exhibits no edema.  Neurological: She is alert and oriented to person, place, and time.  Skin: Skin is warm and dry. She is not diaphoretic.  Psychiatric: She has a normal mood and affect.  Nursing note and vitals reviewed.   ED Course  Procedures (including critical care time) Labs Review Labs Reviewed  CBC - Abnormal; Notable for the following:    Platelets 419 (*)    All other components within normal limits  BASIC METABOLIC PANEL - Abnormal; Notable for the following:    GFR calc non Af Amer 79 (*)    All other components within normal limits  PROTIME-INR - Abnormal; Notable for the following:    Prothrombin Time  21.5 (*)    INR 1.85 (*)    All other components within normal limits  BRAIN NATRIURETIC PEPTIDE  I-STAT TROPOININ, ED  POC OCCULT BLOOD, ED    Imaging Review No results found.   EKG Interpretation   Date/Time:  Tuesday November 25 2014 10:43:15 EST Ventricular Rate:  100 PR Interval:  165 QRS Duration: 110 QT Interval:  412 QTC Calculation: 531 R Axis:   -26 Text Interpretation:  Sinus tachycardia Borderline left axis deviation Low  voltage, precordial leads Abnormal R-wave progression, early transition  Confirmed by DELOS  MD, DOUGLAS ([id number]) on 11/25/2014 10:52:02 AM      MDM   Final diagnoses:  Chest pain, unspecified chest pain type  Palpitations  Shortness of breath   54 year old female here with intermittent palpitations causing chest pain or shortness of breath over the past 3 weeks. Patient also notes some intermittent lightheadedness. She is on Coumadin, states she had a nosebleed and has had some bloody stools recently. On arrival, patient in no acute distress and her vital signs are stable. EKG sinus tach without acute ischemic changes. Rectal exam performed, no gross blood.  Patient does have noted hemorrhoid.  Will obtain lab work and CXR.  Labwork as above-- notably troponin negative and guaiac negative.  INR mildly subtherapeutic at 1.85, patient does admit she has not been checking this regularly.  Chest x-ray is clear.  Patient does have some cardiac RF (HTN, CHF, valve replacement, etc).  Will obtain delta trop.  Low suspicion for PE at this time despite low INR.  Delta trop negative.  VS have remained stable-- mild tachycardia present upon arrival has resolved without intervention.  No noted hypoxia.  At this time, low suspicion for ACS, PE, dissection, or other acute cardiac event. Encouraged patient to follow-up with her cardiologist, Dr. Aundra Dubin.  Discussed plan with patient, he/she acknowledged understanding and agreed with plan of care.  Return precautions  given for new or worsening symptoms.  Case discussed with attending physician, Dr. Stark Jock, who agrees with assessment and plan of care.  Larene Pickett, PA-C 11/25/14 1559  Veryl Speak, MD 11/25/14 [phone]

## 2014-11-25 NOTE — ED Notes (Signed)
Patient reports she has noticed her heart rate is irregular for 2 weeks.  States it is up and down.  She has hx of valve replacement and is on warfarin.  Patient has not had her level checked recently.  She states she is feeling sob and dizzy.  She has also seen blood in her stools and has had nose bleed.  Patient is alert.  Skin is warm and dry.

## 2014-11-25 NOTE — Discharge Instructions (Signed)
Your work up here was normal. Please follow-up with Dr. Aundra Dubin-- call to make appt. Return to the ED for new concerns.

## 2014-11-25 NOTE — ED Notes (Signed)
Report given to oncoming RN.

## 2014-11-25 NOTE — ED Notes (Signed)
Report from [place], rn.  Pt care assumed.

## 2014-12-02 ENCOUNTER — Ambulatory Visit (INDEPENDENT_AMBULATORY_CARE_PROVIDER_SITE_OTHER): Payer: Self-pay | Admitting: *Deleted

## 2014-12-02 DIAGNOSIS — I059 Rheumatic mitral valve disease, unspecified: Secondary | ICD-10-CM

## 2014-12-02 DIAGNOSIS — Z954 Presence of other heart-valve replacement: Secondary | ICD-10-CM

## 2014-12-02 DIAGNOSIS — Z952 Presence of prosthetic heart valve: Secondary | ICD-10-CM

## 2014-12-02 DIAGNOSIS — Z7901 Long term (current) use of anticoagulants: Secondary | ICD-10-CM

## 2014-12-02 LAB — POCT INR: INR: 1.9

## 2014-12-02 MED ORDER — WARFARIN SODIUM 5 MG PO TABS: ORAL_TABLET | ORAL | Status: DC

## 2014-12-09 ENCOUNTER — Ambulatory Visit (INDEPENDENT_AMBULATORY_CARE_PROVIDER_SITE_OTHER): Payer: Self-pay | Admitting: Pharmacist Clinician (PhC)/ Clinical Pharmacy Specialist

## 2014-12-09 DIAGNOSIS — Z954 Presence of other heart-valve replacement: Secondary | ICD-10-CM

## 2014-12-09 DIAGNOSIS — Z7901 Long term (current) use of anticoagulants: Secondary | ICD-10-CM

## 2014-12-09 DIAGNOSIS — I059 Rheumatic mitral valve disease, unspecified: Secondary | ICD-10-CM

## 2014-12-09 DIAGNOSIS — Z952 Presence of prosthetic heart valve: Secondary | ICD-10-CM

## 2014-12-09 LAB — POCT INR: INR: 2

## 2014-12-19 ENCOUNTER — Ambulatory Visit (INDEPENDENT_AMBULATORY_CARE_PROVIDER_SITE_OTHER): Payer: Self-pay | Admitting: *Deleted

## 2014-12-19 DIAGNOSIS — Z954 Presence of other heart-valve replacement: Secondary | ICD-10-CM

## 2014-12-19 DIAGNOSIS — Z952 Presence of prosthetic heart valve: Secondary | ICD-10-CM

## 2014-12-19 DIAGNOSIS — Z7901 Long term (current) use of anticoagulants: Secondary | ICD-10-CM

## 2014-12-19 DIAGNOSIS — I059 Rheumatic mitral valve disease, unspecified: Secondary | ICD-10-CM

## 2014-12-19 LAB — POCT INR: INR: 6.9

## 2014-12-19 LAB — PROTIME-INR
INR: 4.78 — AB (ref <1.50)
Prothrombin Time: 45.1 seconds — ABNORMAL HIGH (ref 11.6–15.2)

## 2014-12-28 NOTE — Progress Notes (Signed)
Cardiology Office Note   Date:  12/29/2014   ID:  [name], [name] 1961-08-22, MRN [id number]  PCP:  Mary Champagne, MD  Cardiologist:  Dr. Loralie [name]   Electrophysiologist:  Dr. Cristopher Peru   Chief Complaint  Patient presents with  . Hospitalization Follow-up    s/p ED visit  . Shortness of Breath  . Palpitations     History of Present Illness: Mary [name] is a 54 y.o. 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 She underwent R mini-thoracotomy with mechanical MVR and TV repair.  Post op course c/b CHB that recovered without needed a PPM. She was kept off all AVN blocking agents. Last seen by Dr. Loralie [name] 02/2014.  She wore a Holter monitor in 7/15 and was noted to have asymptomatic CHB while sleeping.  She saw Dr. Cristopher Peru.  Pacemaker was not recommended as CHB only occurred during sleep.  She was seen in the ED 11/25/14 with palpitations, chest pain, dyspnea and rectal bleeding.  Hemoccult was negative.  INR was < 2. Troponins were negative.  She was DC for OP FU.  She returns for follow-up. She mainly complains of palpitations. This feels like a rapid heartbeat. She's had these for several years now. They seem worse. They were much worse when she called EMS to go to the emergency room on 3/1.  She felt near syncopal. She denies frank syncope. Her palpitations come and go.  She denies any aggravating factors. She continues to have dyspnea with exertion. She's had this since her surgery. She does sleep on 2 pillows. She denies LE edema. She has gradually gained weight since her surgery. She recently lost 10 pounds (since November).  Overall, she is NYHA 2b.  Studies/Reports Reviewed Today:  Echo 7/15 - Mild concentric hypertrophy. EF 60% to 65%. Wall motion was normal.  Grade 1 diastolic dysfunction  - Aortic valve: Trileaflet; normal thickness leaflets. There was no regurgitation. - Aortic root: The aortic root was normal in size. - Mitral valve:  S/P MVR (St Jude mechanical valve) and appears functioning well. Mild central mitral regurgitation, no paravalvular leak. Normal transmitral gradients. Mean gradient (D): 5 mm Hg. - Left atrium: The atrium was mildly dilated. - Right ventricle: Systolic function was mildly reduced. - Pulmonary arteries: Systolic pressure can&'t be accurately assessed.  Carotid US 8/14 Bilateral - 1% to 39% ICA stenosis   Cardiac cath 6/14 Left main: No obstructive disease.  Left Anterior Descending Artery:  No obstructive disease noted.   Circumflex Artery:   No obstructive disease.   Right Coronary Artery:  no obstructive disease.  Left Ventricular Angiogram: LVEF 65-70%. 3+ MR Distal Aortogram: No aneurysm. No stenosis.    Past Medical History  Diagnosis Date  . Asthma     "as a baby"   . Anemia   . History of blood transfusion     "14 w/1st pregnancy; 2 w/last C-section" (03/15/2013)  . GERD (gastroesophageal reflux disease)   . Hypertension     no pcp   will go to [facility]  . Severe mitral regurgitation 04/22/2013  . SVT (supraventricular tachycardia) 03/16/2013  . Acute diastolic heart failure 6/43/3295  . 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 Tricuspid regurgitation   . S/P mitral valve replacement with metallic valve 1/88/4166    2mm Sorin Carbomedics mechanical prosthesis via right mini thoracotomy approach  . 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    Complex valvuloplasty  including Cor-matrix ECM patch augmentation of anterior and lateral leaflets with 49mm Edwards mc3 ring annuloplasty via right mini-thoracotomy approach  . Complete heart block 05/28/2013    Post-op  . Epigastric hernia 200's  1. 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 In 8/14, had right mini-thoracotomy with mechanical mitral valve replacement (St Jude) + tricuspid valve repair. Echo (9/14): EF 55-60%, well-seated mechanical mitral valve. 2. Post-op complete heart block in 8/14,  resolved.  3. LHC (6/14) with no significant coronary artery disease.  4. Right femoral hernia repair 8/14.   Past Surgical History  Procedure Laterality Date  . Appendectomy  ~1978  . Cesarean section  1983; 1999  . Laparoscopic cholecystectomy  2003  . Cesarean section with bilateral tubal ligation  1999  . Tee without cardioversion N/A 03/18/2013    Procedure: TRANSESOPHAGEAL ECHOCARDIOGRAM (TEE);  Surgeon: Larey Dresser, MD;  Location: [place];  Service: Cardiovascular;  Laterality: N/A;  . Cardiac catheterization    . Multiple extractions with alveoloplasty N/A 04/04/2013    Procedure: Extraction of tooth #'s 1,8,9,13,14,15,23,24,25,26 with alveoloplasty and gross debridement of remaining teeth;  Surgeon: Lenn Cal, DDS;  Location: [place];  Service: Oral Surgery;  Laterality: N/A;  . Minimally invasive tricuspid valve repair Right 05/23/2013    Procedure: MINIMALLY INVASIVE TRICUSPID VALVE REPAIR;  Surgeon: Rexene Alberts, MD;  Location: [place];  Service: Open Heart Surgery;  Laterality: Right;  . Intraoperative transesophageal echocardiogram N/A 05/23/2013    Procedure: INTRAOPERATIVE TRANSESOPHAGEAL ECHOCARDIOGRAM;  Surgeon: Rexene Alberts, MD;  Location: [place];  Service: Open Heart Surgery;  Laterality: N/A;  . Femoral hernia repair Right 05/23/2013    Procedure: HERNIA REPAIR FEMORAL;  Surgeon: Rexene Alberts, MD;  Location: [place];  Service: Open Heart Surgery;  Laterality: Right;  . Mitral valve replacement N/A 05/23/2013    Procedure: MITRAL VALVE (MV) REPLACEMENT;  Surgeon: Rexene Alberts, MD;  Location: [place];  Service: Open Heart Surgery;  Laterality: N/A;  . Tee without cardioversion N/A 06/17/2013    Procedure: TRANSESOPHAGEAL ECHOCARDIOGRAM (TEE);  Surgeon: Lelon Perla, MD;  Location: [facility] ENDOSCOPY;  Service: Cardiovascular;  Laterality: N/A;  . Left and right heart catheterization with coronary angiogram N/A 03/22/2013    Procedure: LEFT AND RIGHT HEART  CATHETERIZATION WITH CORONARY ANGIOGRAM;  Surgeon: Burnell Blanks, MD;  Location: [facility] CATH LAB;  Service: Cardiovascular;  Laterality: N/A;     Current Outpatient Prescriptions  Medication Sig Dispense Refill  . aspirin EC 81 MG tablet Take 1 tablet (81 mg total) by mouth daily.    [name] ibuprofen (ADVIL,MOTRIN) 200 MG tablet Take 400 mg by mouth every 6 (six) hours as needed (for pain).     [name] warfarin (COUMADIN) 5 MG tablet Take as directed by coumadin clinic 40 tablet 0   No current facility-administered medications for this visit.    Allergies:   Oxycodone    Social History:  The patient  reports that she has been smoking Cigarettes.  She has a 18 pack-year smoking history. She has never used smokeless tobacco. She reports that she drinks about 3.6 oz of alcohol per week. She reports that she does not use illicit drugs.   Family History:  The patient's family history includes Cancer in her mother; Sarcoidosis in her sister; Stroke in her father. There is no history of Heart attack.    ROS:   Please see the history of present illness.   Review of Systems  Constitution:  Positive for weight loss.  Cardiovascular: Positive for dyspnea on exertion, irregular heartbeat, orthopnea, palpitations and paroxysmal nocturnal dyspnea.  Respiratory: Positive for cough.        Green sputum (for 1 month)  Hematologic/Lymphatic: Bruises/bleeds easily.  Gastrointestinal: Positive for diarrhea.  Neurological: Positive for dizziness.  All other systems reviewed and are negative.    PHYSICAL EXAM: VS:  BP 120/78 mmHg  Pulse 105  Ht 5\' 2"  (1.575 m)  Wt 135 lb (61.236 kg)  BMI 24.69 kg/m2  LMP 01/28/2013    Wt Readings from Last 3 Encounters:  12/29/14 135 lb (61.236 kg)  11/25/14 139 lb (63.05 kg)  07/14/14 141 lb (63.957 kg)     GEN: Well nourished, well developed, in no acute distress HEENT: normal Neck: no JVD, no masses Cardiac:  Mechanical S1/Normal S2, RRR; no murmur ,  no  rubs or gallops, no edema  Respiratory:  clear to auscultation bilaterally, no wheezing, rhonchi or rales. GI: soft, nontender, nondistended, + BS MS: no deformity or atrophy Skin: warm and dry  Neuro:  CNs II-XII intact, Strength and sensation are intact Psych: Normal affect   EKG:  EKG is ordered today.  It demonstrates:   Sinus tachycardia, LAD, HR 105, biatrial enlargement, no change from prior tracing   Recent Labs: 02/24/2014: TSH 0.88 07/14/2014: ALT 13; Pro B Natriuretic peptide (BNP) 610.5* 11/25/2014: B Natriuretic Peptide 36.5; BUN 7; Creatinine 0.84; Hemoglobin 13.0; Platelets 419*; Potassium 4.0; Sodium 143    Lipid Panel No results found for: CHOL, TRIG, HDL, CHOLHDL, VLDL, LDLCALC, LDLDIRECT    ASSESSMENT AND PLAN:  Palpitations She has had a history of SVT. She's also had a history of complete heart block. Holter monitor last year demonstrated complete heart block during hours of sleep. There was no indication for pacemaker. Her palpitations seem to be getting worse. At this point, I believe we have to further document her rhythm before we can decide on medical therapy versus whether or not she meets criteria for pacemaker implantation.    -  Labs today: TSH, CBC, basic metabolic panel    -  Arrange 30 day event monitor.  Complete heart block As noted, this resolved after her surgery. She did have complete heart block noted during hours of sleep during Holter monitor last year. This was felt to be asymptomatic and did not require pacemaker implantation. Given her increased palpitations, 30 day event monitor will be arranged.  Shortness of breath She has a long history of smoking. I suspect some of this may be related to COPD. I will obtain a chest x-ray, BNP and follow-up echocardiogram.  S/P minimally-invasive mitral valve replacement with mechanical valve and tricuspid valve repair Follow-up with the Coumadin clinic as planned. Obtain follow-up echocardiogram.  Continue SBE prophylaxis.  Bronchitis She has had a cough for a month with green sputum. Obtain chest x-ray. Start doxycycline 100 mg twice a day 1 week. We discussed cautions with sun exposure.   Current medicines are reviewed at length with the patient today.  The patient does not have concerns regarding medicines.  The following changes have been made:  As above.   Labs/ tests ordered today include:  Orders Placed This Encounter  Procedures  . DG Chest 2 View  . Basic Metabolic Panel (BMET)  . CBC w/Diff  . TSH  . B Nat Peptide  . Ambulatory referral to Internal Medicine  . EKG 12-Lead  . Cardiac event monitor  . 2D Echocardiogram without contrast  Disposition:   FU with Dr. Loralie [name] or me 6-8 weeks.  Refer to PCP.   Signed, Versie Starks, MHS 12/29/2014 8:47 AM    [facility] [address], [place], [place]  [address] Phone: [phone]; Fax: [phone]

## 2014-12-29 ENCOUNTER — Encounter: Payer: Self-pay | Admitting: Physician Assistant

## 2014-12-29 ENCOUNTER — Other Ambulatory Visit: Payer: Self-pay | Admitting: *Deleted

## 2014-12-29 ENCOUNTER — Ambulatory Visit (INDEPENDENT_AMBULATORY_CARE_PROVIDER_SITE_OTHER): Payer: Self-pay | Admitting: Physician Assistant

## 2014-12-29 VITALS — BP 120/78 | HR 105 | Ht 62.0 in | Wt 135.0 lb

## 2014-12-29 DIAGNOSIS — J40 Bronchitis, not specified as acute or chronic: Secondary | ICD-10-CM

## 2014-12-29 DIAGNOSIS — I442 Atrioventricular block, complete: Secondary | ICD-10-CM

## 2014-12-29 DIAGNOSIS — R002 Palpitations: Secondary | ICD-10-CM

## 2014-12-29 DIAGNOSIS — R0602 Shortness of breath: Secondary | ICD-10-CM

## 2014-12-29 DIAGNOSIS — Z954 Presence of other heart-valve replacement: Secondary | ICD-10-CM

## 2014-12-29 LAB — BRAIN NATRIURETIC PEPTIDE: Pro B Natriuretic peptide (BNP): 22 pg/mL (ref 0.0–100.0)

## 2014-12-29 MED ORDER — DOXYCYCLINE HYCLATE 100 MG PO CAPS: 100.0000 mg | ORAL_CAPSULE | Freq: Two times a day (BID) | ORAL | Status: DC

## 2014-12-29 MED ORDER — WARFARIN SODIUM 5 MG PO TABS: ORAL_TABLET | ORAL | Status: DC

## 2014-12-29 NOTE — Patient Instructions (Addendum)
Your physician has requested that you have an echocardiogram. Echocardiography is a painless test that uses sound waves to create images of your heart. It provides your doctor with information about the size and shape of your heart and how well your heart's chambers and valves are working. This procedure takes approximately one hour. There are no restrictions for this procedure.  Your physician recommends that you return for lab work in: TODAY BMET, CBC W/DIFF, TSH, BNP  Your physician has recommended that you wear an event monitor 30 DAYS. Event monitors are medical devices that record the heart's electrical activity. Doctors most often [place] these monitors to diagnose arrhythmias. Arrhythmias are problems with the speed or rhythm of the heartbeat. The monitor is a small, portable device. You can wear one while you do your normal daily activities. This is usually used to diagnose what is causing palpitations/syncope (passing out).  You have been referred to [facility]  START DOXYCYLINE 100 MG 1 CAP TWICE DAILY FOR 7 DAYS THEN STOP  PLEASE FOLLOW UP WITH SCOTT WEAVER, PAC IN 6-8 WEEKS SAME DAY DR. Aundra Dubin IS IN THE OFFICE

## 2014-12-30 ENCOUNTER — Other Ambulatory Visit: Payer: Self-pay

## 2015-01-01 ENCOUNTER — Ambulatory Visit (HOSPITAL_COMMUNITY): Payer: Self-pay | Attending: Cardiology

## 2015-01-01 ENCOUNTER — Ambulatory Visit (INDEPENDENT_AMBULATORY_CARE_PROVIDER_SITE_OTHER): Payer: Self-pay | Admitting: *Deleted

## 2015-01-01 ENCOUNTER — Ambulatory Visit
Admission: RE | Admit: 2015-01-01 | Discharge: 2015-01-01 | Disposition: A | Discharge status: Home / Self Care | Payer: Self-pay | Source: Ambulatory Visit | Attending: Physician Assistant | Admitting: Physician Assistant

## 2015-01-01 ENCOUNTER — Other Ambulatory Visit (INDEPENDENT_AMBULATORY_CARE_PROVIDER_SITE_OTHER): Payer: Self-pay | Admitting: *Deleted

## 2015-01-01 DIAGNOSIS — Z952 Presence of prosthetic heart valve: Secondary | ICD-10-CM

## 2015-01-01 DIAGNOSIS — Z954 Presence of other heart-valve replacement: Secondary | ICD-10-CM

## 2015-01-01 DIAGNOSIS — I059 Rheumatic mitral valve disease, unspecified: Secondary | ICD-10-CM

## 2015-01-01 DIAGNOSIS — Z7901 Long term (current) use of anticoagulants: Secondary | ICD-10-CM

## 2015-01-01 DIAGNOSIS — I442 Atrioventricular block, complete: Secondary | ICD-10-CM

## 2015-01-01 DIAGNOSIS — J40 Bronchitis, not specified as acute or chronic: Secondary | ICD-10-CM

## 2015-01-01 DIAGNOSIS — R0602 Shortness of breath: Secondary | ICD-10-CM | POA: Insufficient documentation

## 2015-01-01 DIAGNOSIS — I471 Supraventricular tachycardia: Secondary | ICD-10-CM

## 2015-01-01 DIAGNOSIS — R002 Palpitations: Secondary | ICD-10-CM | POA: Insufficient documentation

## 2015-01-01 LAB — BASIC METABOLIC PANEL
BUN: 9 mg/dL (ref 6–23)
CALCIUM: 9.5 mg/dL (ref 8.4–10.5)
CO2: 25 mEq/L (ref 19–32)
Chloride: 110 mEq/L (ref 96–112)
Creatinine, Ser: 0.91 mg/dL (ref 0.40–1.20)
GFR: 83.38 mL/min (ref >60.00)
Glucose, Bld: 63 mg/dL — ABNORMAL LOW (ref 70–99)
POTASSIUM: 4 meq/L (ref 3.5–5.1)
SODIUM: 140 meq/L (ref 135–145)

## 2015-01-01 LAB — CBC WITH DIFFERENTIAL/PLATELET
BASOS ABS: 0 10*3/uL (ref 0.0–0.1)
Basophils Relative: 0.5 % (ref 0.0–3.0)
Eosinophils Absolute: 0.5 10*3/uL (ref 0.0–0.7)
Eosinophils Relative: 7.7 % — ABNORMAL HIGH (ref 0.0–5.0)
HCT: 42.8 % (ref 36.0–46.0)
HEMOGLOBIN: 14.4 g/dL (ref 12.0–15.0)
LYMPHS ABS: 1.2 10*3/uL (ref 0.7–4.0)
Lymphocytes Relative: 18.6 % (ref 12.0–46.0)
MCHC: 33.5 g/dL (ref 30.0–36.0)
MCV: 98.1 fl (ref 78.0–100.0)
MONO ABS: 0.4 10*3/uL (ref 0.1–1.0)
Monocytes Relative: 6.5 % (ref 3.0–12.0)
NEUTROS PCT: 66.7 % (ref 43.0–77.0)
Neutro Abs: 4.3 10*3/uL (ref 1.4–7.7)
Platelets: 423 10*3/uL — ABNORMAL HIGH (ref 150.0–400.0)
RBC: 4.37 Mil/uL (ref 3.87–5.11)
RDW: 15 % (ref 11.5–15.5)
WBC: 6.4 10*3/uL (ref 4.0–10.5)

## 2015-01-01 LAB — POCT INR: INR: 8

## 2015-01-01 LAB — TSH: TSH: 1.64 u[IU]/mL (ref 0.35–4.50)

## 2015-01-01 LAB — PROTIME-INR
INR: 9.3 ratio — AB (ref 0.8–1.0)
Prothrombin Time: 97.4 s (ref 9.6–13.1)

## 2015-01-01 NOTE — Progress Notes (Signed)
2D Echo completed. 01/01/2015

## 2015-01-02 ENCOUNTER — Encounter: Payer: Self-pay | Admitting: Physician Assistant

## 2015-01-02 ENCOUNTER — Telehealth: Payer: Self-pay | Admitting: *Deleted

## 2015-01-02 NOTE — Telephone Encounter (Signed)
pt notified of all lab, cxr, and echo results with verbal understanding to all results given today. Asked pt if she had been having any fevers or chills, pt denies this. Pt states she has been feeling good. I verified upcoming appts with pt.

## 2015-01-05 ENCOUNTER — Ambulatory Visit (INDEPENDENT_AMBULATORY_CARE_PROVIDER_SITE_OTHER): Payer: Self-pay | Admitting: *Deleted

## 2015-01-05 DIAGNOSIS — Z954 Presence of other heart-valve replacement: Secondary | ICD-10-CM

## 2015-01-05 DIAGNOSIS — I059 Rheumatic mitral valve disease, unspecified: Secondary | ICD-10-CM

## 2015-01-05 DIAGNOSIS — Z952 Presence of prosthetic heart valve: Secondary | ICD-10-CM

## 2015-01-05 DIAGNOSIS — Z7901 Long term (current) use of anticoagulants: Secondary | ICD-10-CM

## 2015-01-05 LAB — POCT INR: INR: 1.4

## 2015-01-14 ENCOUNTER — Ambulatory Visit (INDEPENDENT_AMBULATORY_CARE_PROVIDER_SITE_OTHER): Payer: Self-pay | Admitting: *Deleted

## 2015-01-14 DIAGNOSIS — Z7901 Long term (current) use of anticoagulants: Secondary | ICD-10-CM

## 2015-01-14 DIAGNOSIS — I059 Rheumatic mitral valve disease, unspecified: Secondary | ICD-10-CM

## 2015-01-14 DIAGNOSIS — Z952 Presence of prosthetic heart valve: Secondary | ICD-10-CM

## 2015-01-14 DIAGNOSIS — Z954 Presence of other heart-valve replacement: Secondary | ICD-10-CM

## 2015-01-14 LAB — POCT INR: INR: 3.7

## 2015-01-22 ENCOUNTER — Ambulatory Visit (INDEPENDENT_AMBULATORY_CARE_PROVIDER_SITE_OTHER): Payer: Self-pay

## 2015-01-22 DIAGNOSIS — Z952 Presence of prosthetic heart valve: Secondary | ICD-10-CM

## 2015-01-22 DIAGNOSIS — Z954 Presence of other heart-valve replacement: Secondary | ICD-10-CM

## 2015-01-22 DIAGNOSIS — Z7901 Long term (current) use of anticoagulants: Secondary | ICD-10-CM

## 2015-01-22 DIAGNOSIS — I059 Rheumatic mitral valve disease, unspecified: Secondary | ICD-10-CM

## 2015-01-22 LAB — POCT INR: INR: 1.9

## 2015-02-05 ENCOUNTER — Ambulatory Visit (INDEPENDENT_AMBULATORY_CARE_PROVIDER_SITE_OTHER): Payer: Self-pay | Admitting: *Deleted

## 2015-02-05 DIAGNOSIS — Z954 Presence of other heart-valve replacement: Secondary | ICD-10-CM

## 2015-02-05 DIAGNOSIS — I059 Rheumatic mitral valve disease, unspecified: Secondary | ICD-10-CM

## 2015-02-05 DIAGNOSIS — Z7901 Long term (current) use of anticoagulants: Secondary | ICD-10-CM

## 2015-02-05 DIAGNOSIS — Z952 Presence of prosthetic heart valve: Secondary | ICD-10-CM

## 2015-02-05 LAB — POCT INR: INR: 2.7

## 2015-02-11 NOTE — Progress Notes (Signed)
Cardiology Office Note   Date:  02/11/2015   ID:  Lonnie, Rosado 11/25/1960, MRN [id number]  PCP:  Loralie Champagne, MD  Cardiologist:  Dr. Loralie Champagne   Electrophysiologist:  Dr. Cristopher Peru   Chief Complaint  Patient presents with  . Palpitations     History of Present Illness: Mary [name] is a 54 y.o. 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 She underwent R mini-thoracotomy with mechanical MVR and TV repair.  Post op course c/b CHB that recovered without needed a PPM. She was kept off all AVN blocking agents. Last seen by Dr. Loralie Champagne 02/2014.  She wore a Holter monitor in 7/15 and was noted to have asymptomatic CHB while sleeping.  She saw Dr. Cristopher Peru.  Pacemaker was not recommended as CHB only occurred during sleep.  She was seen in the ED 11/25/14 with palpitations, chest pain, dyspnea and rectal bleeding.  Hemoccult was negative.  INR was < 2. Troponins were negative.  She was DC for OP FU.  I saw her in follow-up. She mainly complained of rapid palpitations. They seemed worse. There was no frank syncope.  She continued to have dyspnea with exertion that she has had since her surgery.   Echo was done 4/7 and demonstrated normal LVF, MVR functioning well with normal gradients.  There was a small mobile echodensity attached to the valvular ring.  I reviewed this with Dr. Jenkins Rouge and he felt it was likely redundant chordae.  Event monitor was arranged.  She returns for FU.      Studies/Reports Reviewed Today:  Echo 01/01/15 - Mild focal basal hypertrophy of the septum. EF 60% to 65%.  Wall motion was normal;  - Aortic valve: Trileaflet; normal thickness leaflets. There was noregurgitation. - Mitral valve: S/P MVR (St Jude mechanical valve) that appearsfunctioning well. Mild central mitral regurgitation, noparavalvular leak. Normal transmitral gradients.There is a small mobile echodensity attached to the vavular ringthat measures 1.5 x  1 cm. - Left atrium: The atrium was mildly dilated. - Right ventricle: Systolic function was normal. - Right atrium: The atrium was normal in size. - Tricuspid valve: Structurally normal valve. Mean gradient (D): 49mm Hg. - Pulmonary arteries: Systolic pressure was within the normalrange. Impressions: S/P MVR (St Jude mechanical valve) that appears functioning well.Mild central mitral regurgitation, no paravalvular leak. Normaltransmitral gradients.There is a small mobile echodensity attached to the vavular ringthat might represent postsurgical changes, however endocarditisneeds to be considered. Consider TEE if clinically indicated.  Echo 7/15 - Mild concentric hypertrophy. EF 60% to 65%. Wall motion was normal.  Grade 1 diastolic dysfunction  - Aortic valve: Trileaflet; normal thickness leaflets. There was no regurgitation. - Aortic root: The aortic root was normal in size. - Mitral valve: S/P MVR (St Jude mechanical valve) and appears functioning well. Mild central mitral regurgitation, no paravalvular leak. Normal transmitral gradients. Mean gradient (D): 5 mm Hg. - Left atrium: The atrium was mildly dilated. - Right ventricle: Systolic function was mildly reduced. - Pulmonary arteries: Systolic pressure can&'t be accurately assessed.  Carotid US 8/14 Bilateral - 1% to 39% ICA stenosis   Cardiac cath 6/14 Left main: No obstructive disease.  Left Anterior Descending Artery:  No obstructive disease noted.   Circumflex Artery:   No obstructive disease.   Right Coronary Artery:  no obstructive disease.  Left Ventricular Angiogram: LVEF 65-70%. 3+ MR Distal Aortogram: No aneurysm. No stenosis.    Past  Medical History  Diagnosis Date  . Asthma     "as a baby"   . Anemia   . History of blood transfusion     "14 w/1st pregnancy; 2 w/last C-section" (03/15/2013)  . GERD (gastroesophageal reflux disease)   . Hypertension     no pcp   will go to [facility]  . Severe mitral  regurgitation 04/22/2013  . SVT (supraventricular tachycardia) 03/16/2013  . Acute diastolic heart failure 9/52/8413  . 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 Tricuspid regurgitation   . S/P mitral valve replacement with metallic valve 2/44/0102    23mm Sorin Carbomedics mechanical prosthesis via right mini thoracotomy approach  . 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    Complex valvuloplasty including Cor-matrix ECM patch augmentation of anterior and lateral leaflets with 77mm Edwards mc3 ring annuloplasty via right mini-thoracotomy approach  . Complete heart block 05/28/2013    Post-op  . Epigastric hernia 200's  . Hx of echocardiogram     Echo 4/16:  Mild focal basal septal hypertrophy, EF 60-65%, no RWMA, Mechanical MVR ok with mild central regurgitation and no perivalvular leak, small mobile density attached to valvular ring (1.5x1 cm) - post surgical changes vs SBE, mild LAE  1. 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 In 8/14, had right mini-thoracotomy with mechanical mitral valve replacement (St Jude) + tricuspid valve repair. Echo (9/14): EF 55-60%, well-seated mechanical mitral valve. 2. Post-op complete heart block in 8/14, resolved.  3. LHC (6/14) with no significant coronary artery disease.  4. Right femoral hernia repair 8/14.   Past Surgical History  Procedure Laterality Date  . Appendectomy  ~1978  . Cesarean section  1983; 1999  . Laparoscopic cholecystectomy  2003  . Cesarean section with bilateral tubal ligation  1999  . Tee without cardioversion N/A 03/18/2013    Procedure: TRANSESOPHAGEAL ECHOCARDIOGRAM (TEE);  Surgeon: Larey Dresser, MD;  Location: [facility];  Service: Cardiovascular;  Laterality: N/A;  . Cardiac catheterization    . Multiple extractions with alveoloplasty N/A 04/04/2013    Procedure: Extraction of tooth #'s 1,8,9,13,14,15,23,24,25,26 with alveoloplasty and gross debridement of remaining teeth;  Surgeon:  Lenn Cal, DDS;  Location: [facility];  Service: Oral Surgery;  Laterality: N/A;  . Minimally invasive tricuspid valve repair Right 05/23/2013    Procedure: MINIMALLY INVASIVE TRICUSPID VALVE REPAIR;  Surgeon: Rexene Alberts, MD;  Location: [facility];  Service: Open Heart Surgery;  Laterality: Right;  . Intraoperative transesophageal echocardiogram N/A 05/23/2013    Procedure: INTRAOPERATIVE TRANSESOPHAGEAL ECHOCARDIOGRAM;  Surgeon: Rexene Alberts, MD;  Location: [facility];  Service: Open Heart Surgery;  Laterality: N/A;  . Femoral hernia repair Right 05/23/2013    Procedure: HERNIA REPAIR FEMORAL;  Surgeon: Rexene Alberts, MD;  Location: [facility];  Service: Open Heart Surgery;  Laterality: Right;  . Mitral valve replacement N/A 05/23/2013    Procedure: MITRAL VALVE (MV) REPLACEMENT;  Surgeon: Rexene Alberts, MD;  Location: [facility];  Service: Open Heart Surgery;  Laterality: N/A;  . Tee without cardioversion N/A 06/17/2013    Procedure: TRANSESOPHAGEAL ECHOCARDIOGRAM (TEE);  Surgeon: Lelon Perla, MD;  Location: [facility] ENDOSCOPY;  Service: Cardiovascular;  Laterality: N/A;  . Left and right heart catheterization with coronary angiogram N/A 03/22/2013    Procedure: LEFT AND RIGHT HEART CATHETERIZATION WITH CORONARY ANGIOGRAM;  Surgeon: Burnell Blanks, MD;  Location: [facility] CATH LAB;  Service: Cardiovascular;  Laterality: N/A;     Current Outpatient Prescriptions  Medication Sig  Dispense Refill  . aspirin EC 81 MG tablet Take 1 tablet (81 mg total) by mouth daily.    [name] doxycycline (VIBRAMYCIN) 100 MG capsule Take 1 capsule (100 mg total) by mouth 2 (two) times daily. 14 capsule 0  . ibuprofen (ADVIL,MOTRIN) 200 MG tablet Take 400 mg by mouth every 6 (six) hours as needed (for pain).     [name] warfarin (COUMADIN) 5 MG tablet Take as directed by coumadin clinic 40 tablet 1   No current facility-administered medications for this visit.    Allergies:   Oxycodone    Social History:  The patient  reports  that she has been smoking Cigarettes.  She has a 18 pack-year smoking history. She has never used smokeless tobacco. She reports that she drinks about 3.6 oz of alcohol per week. She reports that she does not use illicit drugs.   Family History:  The patient's family history includes Cancer in her mother; Sarcoidosis in her sister; Stroke in her father. There is no history of Heart attack.    ROS:   Please see the history of present illness.   ROS   PHYSICAL EXAM: VS:  LMP 01/28/2013    Wt Readings from Last 3 Encounters:  12/29/14 135 lb (61.236 kg)  11/25/14 139 lb (63.05 kg)  07/14/14 141 lb (63.957 kg)     GEN: Well nourished, well developed, in no acute distress HEENT: normal Neck: no JVD, no masses Cardiac:  Mechanical S1/Normal S2, RRR; no murmur ,  no rubs or gallops, no edema  Respiratory:  clear to auscultation bilaterally, no wheezing, rhonchi or rales. GI: soft, nontender, nondistended, + BS MS: no deformity or atrophy Skin: warm and dry  Neuro:  CNs II-XII intact, Strength and sensation are intact Psych: Normal affect   EKG:  EKG is ordered today.  It demonstrates:     Recent Labs: 07/14/2014: ALT 13 11/25/2014: B Natriuretic Peptide 36.5 12/29/2014: Pro B Natriuretic peptide (BNP) 22.0 01/01/2015: BUN 9; Creatinine 0.91; Hemoglobin 14.4; Platelets 423.0*; Potassium 4.0; Sodium 140; TSH 1.64    Lipid Panel No results found for: CHOL, TRIG, HDL, CHOLHDL, VLDL, LDLCALC, LDLDIRECT    ASSESSMENT AND PLAN:  Palpitations   Complete heart block As noted, this resolved after her surgery. She did have complete heart block noted during hours of sleep during Holter monitor last year. This was felt to be asymptomatic and did not require pacemaker implantation.   Shortness of breath She has a long history of smoking. I suspect some of this may be related to COPD. Recent BNP and CXR were normal.    S/P minimally-invasive mitral valve replacement with mechanical valve  and tricuspid valve repair Follow-up with the Coumadin clinic as planned. Recent echo with normal LVF and well functioning MVR. Continue SBE prophylaxis.     Current medicines are reviewed at length with the patient today.  Any concerns are as outlined above. The following changes have been made:       Labs/ tests ordered today include:  No orders of the defined types were placed in this encounter.    Disposition:   FU    Signed, Versie Starks, MHS 02/11/2015 10:18 PM    [facility] [address], [place], [place]  [address] Phone: [phone]; Fax: [phone]    This encounter was created in error - please disregard.

## 2015-02-12 ENCOUNTER — Encounter: Payer: Self-pay | Admitting: Physician Assistant

## 2015-02-26 ENCOUNTER — Ambulatory Visit (INDEPENDENT_AMBULATORY_CARE_PROVIDER_SITE_OTHER): Payer: Self-pay

## 2015-02-26 ENCOUNTER — Encounter: Payer: Self-pay | Admitting: Physician Assistant

## 2015-02-26 DIAGNOSIS — I059 Rheumatic mitral valve disease, unspecified: Secondary | ICD-10-CM

## 2015-02-26 DIAGNOSIS — Z7901 Long term (current) use of anticoagulants: Secondary | ICD-10-CM

## 2015-02-26 DIAGNOSIS — Z954 Presence of other heart-valve replacement: Secondary | ICD-10-CM

## 2015-02-26 DIAGNOSIS — Z952 Presence of prosthetic heart valve: Secondary | ICD-10-CM

## 2015-02-26 LAB — POCT INR: INR: 6.2

## 2015-02-26 LAB — PROTIME-INR: Prothrombin Time: 64.7 s (ref 9.6–13.1)

## 2015-03-05 ENCOUNTER — Ambulatory Visit (INDEPENDENT_AMBULATORY_CARE_PROVIDER_SITE_OTHER): Payer: Self-pay | Admitting: *Deleted

## 2015-03-05 DIAGNOSIS — Z952 Presence of prosthetic heart valve: Secondary | ICD-10-CM

## 2015-03-05 DIAGNOSIS — Z954 Presence of other heart-valve replacement: Secondary | ICD-10-CM

## 2015-03-05 DIAGNOSIS — I059 Rheumatic mitral valve disease, unspecified: Secondary | ICD-10-CM

## 2015-03-05 DIAGNOSIS — Z7901 Long term (current) use of anticoagulants: Secondary | ICD-10-CM

## 2015-03-05 LAB — POCT INR: INR: 1.6

## 2015-03-13 ENCOUNTER — Ambulatory Visit: Payer: Self-pay | Admitting: Physician Assistant

## 2015-03-16 ENCOUNTER — Ambulatory Visit (INDEPENDENT_AMBULATORY_CARE_PROVIDER_SITE_OTHER): Payer: Self-pay | Admitting: *Deleted

## 2015-03-16 DIAGNOSIS — Z7901 Long term (current) use of anticoagulants: Secondary | ICD-10-CM

## 2015-03-16 DIAGNOSIS — Z952 Presence of prosthetic heart valve: Secondary | ICD-10-CM

## 2015-03-16 DIAGNOSIS — I059 Rheumatic mitral valve disease, unspecified: Secondary | ICD-10-CM

## 2015-03-16 DIAGNOSIS — Z954 Presence of other heart-valve replacement: Secondary | ICD-10-CM

## 2015-03-16 LAB — POCT INR: INR: 3.1

## 2015-03-31 ENCOUNTER — Ambulatory Visit (INDEPENDENT_AMBULATORY_CARE_PROVIDER_SITE_OTHER): Payer: Self-pay | Admitting: *Deleted

## 2015-03-31 DIAGNOSIS — Z952 Presence of prosthetic heart valve: Secondary | ICD-10-CM

## 2015-03-31 DIAGNOSIS — Z7901 Long term (current) use of anticoagulants: Secondary | ICD-10-CM

## 2015-03-31 DIAGNOSIS — Z954 Presence of other heart-valve replacement: Secondary | ICD-10-CM

## 2015-03-31 DIAGNOSIS — I059 Rheumatic mitral valve disease, unspecified: Secondary | ICD-10-CM

## 2015-03-31 LAB — POCT INR: INR: 2.2

## 2015-03-31 MED ORDER — WARFARIN SODIUM 5 MG PO TABS: ORAL_TABLET | ORAL | Status: DC

## 2015-04-03 ENCOUNTER — Encounter (HOSPITAL_COMMUNITY): Payer: Self-pay | Admitting: Emergency Medicine

## 2015-04-03 ENCOUNTER — Emergency Department (HOSPITAL_COMMUNITY)
Admission: EM | Admit: 2015-04-03 | Discharge: 2015-04-04 | Disposition: A | Discharge status: Home / Self Care | Payer: Self-pay | Attending: Emergency Medicine | Admitting: Emergency Medicine

## 2015-04-03 DIAGNOSIS — Z7982 Long term (current) use of aspirin: Secondary | ICD-10-CM | POA: Insufficient documentation

## 2015-04-03 DIAGNOSIS — Z7901 Long term (current) use of anticoagulants: Secondary | ICD-10-CM | POA: Insufficient documentation

## 2015-04-03 DIAGNOSIS — R112 Nausea with vomiting, unspecified: Secondary | ICD-10-CM | POA: Insufficient documentation

## 2015-04-03 DIAGNOSIS — R791 Abnormal coagulation profile: Secondary | ICD-10-CM

## 2015-04-03 DIAGNOSIS — Z862 Personal history of diseases of the blood and blood-forming organs and certain disorders involving the immune mechanism: Secondary | ICD-10-CM | POA: Insufficient documentation

## 2015-04-03 DIAGNOSIS — Z9889 Other specified postprocedural states: Secondary | ICD-10-CM | POA: Insufficient documentation

## 2015-04-03 DIAGNOSIS — Z792 Long term (current) use of antibiotics: Secondary | ICD-10-CM | POA: Insufficient documentation

## 2015-04-03 DIAGNOSIS — F10120 Alcohol abuse with intoxication, uncomplicated: Secondary | ICD-10-CM | POA: Insufficient documentation

## 2015-04-03 DIAGNOSIS — F10920 Alcohol use, unspecified with intoxication, uncomplicated: Secondary | ICD-10-CM

## 2015-04-03 DIAGNOSIS — Z8719 Personal history of other diseases of the digestive system: Secondary | ICD-10-CM | POA: Insufficient documentation

## 2015-04-03 DIAGNOSIS — I5031 Acute diastolic (congestive) heart failure: Secondary | ICD-10-CM | POA: Insufficient documentation

## 2015-04-03 DIAGNOSIS — Z720 Tobacco use: Secondary | ICD-10-CM | POA: Insufficient documentation

## 2015-04-03 DIAGNOSIS — R7989 Other specified abnormal findings of blood chemistry: Secondary | ICD-10-CM | POA: Insufficient documentation

## 2015-04-03 DIAGNOSIS — J45909 Unspecified asthma, uncomplicated: Secondary | ICD-10-CM | POA: Insufficient documentation

## 2015-04-03 DIAGNOSIS — I10 Essential (primary) hypertension: Secondary | ICD-10-CM | POA: Insufficient documentation

## 2015-04-03 LAB — CBC
HEMATOCRIT: 39.4 % (ref 36.0–46.0)
Hemoglobin: 13.9 g/dL (ref 12.0–15.0)
MCH: 33.6 pg (ref 26.0–34.0)
MCHC: 35.3 g/dL (ref 30.0–36.0)
MCV: 95.2 fL (ref 78.0–100.0)
Platelets: 402 10*3/uL — ABNORMAL HIGH (ref 150–400)
RBC: 4.14 MIL/uL (ref 3.87–5.11)
RDW: 14.4 % (ref 11.5–15.5)
WBC: 6.6 10*3/uL (ref 4.0–10.5)

## 2015-04-03 LAB — PROTIME-INR
INR: 3.82 — AB (ref 0.00–1.49)
Prothrombin Time: 36.7 seconds — ABNORMAL HIGH (ref 11.6–15.2)

## 2015-04-03 NOTE — ED Notes (Signed)
Patient here with complaint of bloody emesis. States that she was at a party tonight, drank some alcoholic punch, and suddenly felt bad afterward. Others at party drank the same punch without symptoms. Family reports numerous episodes of dark clot filled emesis. Patient denies pain, currently nauseated. HX of heart valve replacement; currently on coumadin.

## 2015-04-04 LAB — COMPREHENSIVE METABOLIC PANEL
ALK PHOS: 114 U/L (ref 38–126)
ALT: 17 U/L (ref 14–54)
AST: 37 U/L (ref 15–41)
Albumin: 3.6 g/dL (ref 3.5–5.0)
Anion gap: 11 (ref 5–15)
BUN: 7 mg/dL (ref 6–20)
CO2: 23 mmol/L (ref 22–32)
Calcium: 8.9 mg/dL (ref 8.9–10.3)
Chloride: 102 mmol/L (ref 101–111)
Creatinine, Ser: 0.95 mg/dL (ref 0.44–1.00)
GFR calc Af Amer: >60 mL/min (ref >60)
GFR calc non Af Amer: >60 mL/min (ref >60)
GLUCOSE: 86 mg/dL (ref 65–99)
POTASSIUM: 5 mmol/L (ref 3.5–5.1)
SODIUM: 136 mmol/L (ref 135–145)
Total Bilirubin: 0.1 mg/dL — ABNORMAL LOW (ref 0.3–1.2)
Total Protein: 7.6 g/dL (ref 6.5–8.1)

## 2015-04-04 LAB — TYPE AND SCREEN
ABO/RH(D): O POS
Antibody Screen: NEGATIVE

## 2015-04-04 LAB — I-STAT TROPONIN, ED: TROPONIN I, POC: 0.01 ng/mL (ref 0.00–0.08)

## 2015-04-04 MED ORDER — SODIUM CHLORIDE 0.9 % IV BOLUS (SEPSIS)
1000.0000 mL | Freq: Once | INTRAVENOUS | Status: AC
  Administered 2015-04-04: 1000 mL via INTRAVENOUS

## 2015-04-04 MED ORDER — FAMOTIDINE IN NACL 20-0.9 MG/50ML-% IV SOLN
20.0000 mg | Freq: Once | INTRAVENOUS | Status: AC
  Administered 2015-04-04: 20 mg via INTRAVENOUS
  Filled 2015-04-04: qty 50

## 2015-04-04 MED ORDER — ONDANSETRON HCL 4 MG/2ML IJ SOLN
4.0000 mg | Freq: Once | INTRAMUSCULAR | Status: AC
  Administered 2015-04-04: 4 mg via INTRAVENOUS
  Filled 2015-04-04: qty 2

## 2015-04-04 MED ORDER — ALUM & MAG HYDROXIDE-SIMETH 200-200-20 MG/5ML PO SUSP
30.0000 mL | Freq: Once | ORAL | Status: AC
  Administered 2015-04-04: 30 mL via ORAL
  Filled 2015-04-04: qty 30

## 2015-04-04 NOTE — Discharge Instructions (Signed)
Alcohol Intoxication Mary [name], you were seen today for vomiting blood.  You likely irritated your throat causing bleeding from repeated vomiting.  Also your INR today is 3.8.  Your goal is 2.5-3.5.  Do not take your coumadin tomorrow, then restart it the next day.  Alcohol can raise your level, please stop drinking alcohol and see your primary doctor within 3 days for close follow up.  If symptoms worsen, come back to the ED immediately.  Thank you. Alcohol intoxication occurs when you drink enough alcohol that it affects your ability to function. It can be mild or very severe. Drinking a lot of alcohol in a short time is called binge drinking. This can be very harmful. Drinking alcohol can also be more dangerous if you are taking medicines or other drugs. Some of the effects caused by alcohol may include:  Loss of coordination.  Changes in mood and behavior.  Unclear thinking.  Trouble talking (slurred speech).  Throwing up (vomiting).  Confusion.  Slowed breathing.  Twitching and shaking (seizures).  Loss of consciousness. HOME CARE  Do not drive after drinking alcohol.  Drink enough water and fluids to keep your pee (urine) clear or pale yellow. Avoid caffeine.  Only take medicine as told by your doctor. GET HELP IF:  You throw up (vomit) many times.  You do not feel better after a few days.  You frequently have alcohol intoxication. Your doctor can help decide if you should see a substance use treatment counselor. GET HELP RIGHT AWAY IF:  You become shaky when you stop drinking.  You have twitching and shaking.  You throw up blood. It may look bright red or like coffee grounds.  You notice blood in your poop (bowel movements).  You become lightheaded or pass out (faint). MAKE SURE YOU:   Understand these instructions.  Will watch your condition.  Will get help right away if you are not doing well or get worse. Document Released: 02/29/2008 Document Revised:  05/15/2013 Document Reviewed: 02/15/2013 [facility] Patient Information 2015 [place], [place]. This information is not intended to replace advice given to you by your health care provider. Make sure you discuss any questions you have with your health care provider. Hematemesis This condition is the vomiting of blood. CAUSES  This can happen if you have a peptic ulcer or an irritation of the throat, stomach, or small bowel. Vomiting over and over again or swallowing blood from a nosebleed, coughing or facial injury can also result in bloody vomit. Anti-inflammatory pain medicines are a common cause of this potentially dangerous condition. The most serious causes of vomiting blood include:  Ulcers (a bacteria called H. pylori is common cause of ulcers).  Clotting problems.  Alcoholism.  Cirrhosis. TREATMENT  Treatment depends on the cause and the severity of the bleeding. Small amounts of blood streaks in the vomit is not the same as vomiting large amounts of bloody or dark, coffee grounds-like material. Weakness, fainting, dehydration, anemia, and continued alcohol or drug use increase the risk. Examination may include blood, vomit, or stool tests. The presence of bloody or dark stool that tests positive for blood (Hemoccult) means the bleeding has been going on for some time. Endoscopy and imaging studies may be done. Emergency treatment may include:  IV medicines or fluids.  Blood transfusions.  Surgery. Hospital care is required for high risk patients or when IV fluids or blood is needed. Upper GI bleeding can cause shock and death if not controlled. HOME CARE INSTRUCTIONS  Your treatment does not require hospital care at this time.  Remain at rest until your condition improves.  Drink clear liquids as tolerated.  Avoid:  Alcohol.  Nicotine.  Aspirin.  Any other anti-inflammatory medicine (ibuprofen, naproxen, and many others).  Medications to suppress stomach acid or  vomiting may be needed. Take all your medicine as prescribed.  Be sure to see your caregiver for follow-up as recommended. SEEK IMMEDIATE MEDICAL CARE IF:   You have repeated vomiting, dehydration, fainting, or extreme weakness.  You are vomiting large amounts of bloody or dark material.  You pass large, dark or bloody stools. Document Released: 10/20/2004 Document Revised: 12/05/2011 Document Reviewed: 11/05/2008 [facility] Patient Information 2015 [place], [place]. This information is not intended to replace advice given to you by your health care provider. Make sure you discuss any questions you have with your health care provider.

## 2015-04-04 NOTE — ED Provider Notes (Signed)
CSN: [id number]     Arrival date & time 04/03/15  2242 History   This chart was scribed for Mary Balls, MD by Forrestine Him, ED Scribe. This patient was seen in room A07C/A07C and the patient's care was started 12:41 AM.   Chief Complaint  Patient presents with  . Hematemesis   The history is provided by the patient. No language interpreter was used.    HPI Comments: Mary [name] is a 54 y.o. female who presents to the Emergency Department complaining of intermittent, ongoing hematemesis with associated nausea onset this evening prior to arrival. She reports numerous episodes since time of onset. Pt admits to consuming "a few" alcoholic beverages and noted hematemesis afterwards. She denies any pain at this time. She is unable to keep and fluids or solid foods down at this time. No recent fever, chills, abdominal pain, shortness of breath, or chest pain, [name] is on Coumadin daily due to present artifical valve. PMHx includes HTN. PSHx includes appendectomy~1978, Laparoscopic cholecystectomy-2003, Femoral hernia repair-2014. Pt with known allergy to Oxycodone.  Past Medical History  Diagnosis Date  . Asthma     "as a baby"   . Anemia   . History of blood transfusion     "14 w/1st pregnancy; 2 w/last C-section" (03/15/2013)  . GERD (gastroesophageal reflux disease)   . Hypertension     no pcp   will go to [facility]  . Severe mitral regurgitation 04/22/2013  . SVT (supraventricular tachycardia) 03/16/2013  . Acute diastolic heart failure 5/70/1779  . 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 Tricuspid regurgitation   . S/P mitral valve replacement with metallic valve 3/90/3009    79mm Sorin Carbomedics mechanical prosthesis via right mini thoracotomy approach  . 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    Complex valvuloplasty including Cor-matrix ECM patch augmentation of anterior and lateral leaflets with 3mm Edwards mc3 ring annuloplasty via right mini-thoracotomy approach   . Complete heart block 05/28/2013    Post-op  . Epigastric hernia 200's  . Hx of echocardiogram     Echo 4/16:  Mild focal basal septal hypertrophy, EF 60-65%, no RWMA, Mechanical MVR ok with mild central regurgitation and no perivalvular leak, small mobile density attached to valvular ring (1.5x1 cm) - post surgical changes vs SBE, mild LAE   Past Surgical History  Procedure Laterality Date  . Appendectomy  ~1978  . Cesarean section  1983; 1999  . Laparoscopic cholecystectomy  2003  . Cesarean section with bilateral tubal ligation  1999  . Tee without cardioversion N/A 03/18/2013    Procedure: TRANSESOPHAGEAL ECHOCARDIOGRAM (TEE);  Surgeon: Larey Dresser, MD;  Location: [place];  Service: Cardiovascular;  Laterality: N/A;  . Cardiac catheterization    . Multiple extractions with alveoloplasty N/A 04/04/2013    Procedure: Extraction of tooth #'s 1,8,9,13,14,15,23,24,25,26 with alveoloplasty and gross debridement of remaining teeth;  Surgeon: Lenn Cal, DDS;  Location: [place];  Service: Oral Surgery;  Laterality: N/A;  . Minimally invasive tricuspid valve repair Right 05/23/2013    Procedure: MINIMALLY INVASIVE TRICUSPID VALVE REPAIR;  Surgeon: Rexene Alberts, MD;  Location: [place];  Service: Open Heart Surgery;  Laterality: Right;  . Intraoperative transesophageal echocardiogram N/A 05/23/2013    Procedure: INTRAOPERATIVE TRANSESOPHAGEAL ECHOCARDIOGRAM;  Surgeon: Rexene Alberts, MD;  Location: [place];  Service: Open Heart Surgery;  Laterality: N/A;  . Femoral hernia repair Right 05/23/2013    Procedure: HERNIA REPAIR FEMORAL;  Surgeon: Rexene Alberts, MD;  Location: [facility];  Service: Open Heart Surgery;  Laterality: Right;  . Mitral valve replacement N/A 05/23/2013    Procedure: MITRAL VALVE (MV) REPLACEMENT;  Surgeon: Rexene Alberts, MD;  Location: [place];  Service: Open Heart Surgery;  Laterality: N/A;  . Tee without cardioversion N/A 06/17/2013    Procedure: TRANSESOPHAGEAL  ECHOCARDIOGRAM (TEE);  Surgeon: Lelon Perla, MD;  Location: [facility] ENDOSCOPY;  Service: Cardiovascular;  Laterality: N/A;  . Left and right heart catheterization with coronary angiogram N/A 03/22/2013    Procedure: LEFT AND RIGHT HEART CATHETERIZATION WITH CORONARY ANGIOGRAM;  Surgeon: Burnell Blanks, MD;  Location: [facility] CATH LAB;  Service: Cardiovascular;  Laterality: N/A;   Family History  Problem Relation Age of Onset  . Sarcoidosis Sister   . Stroke Father   . Heart attack Neg Hx   . Cancer Mother    History  Substance Use Topics  . Smoking status: Current Every Day Smoker -- 0.50 packs/day for 36 years    Types: Cigarettes    Last Attempt to Quit: 03/15/2013  . Smokeless tobacco: Never Used  . Alcohol Use: 3.6 oz/week    6 Glasses of wine per week     Comment: 03/15/2013 "bottle of wine/wk"   OB History    No data available     Review of Systems  Constitutional: Negative for fever and chills.  Respiratory: Negative for cough and shortness of breath.   Cardiovascular: Negative for chest pain.  Gastrointestinal: Positive for nausea and vomiting.  Musculoskeletal: Negative for arthralgias.  All other systems reviewed and are negative.     Allergies  Oxycodone  Home Medications   Prior to Admission medications   Medication Sig Start Date End Date Taking? Authorizing Provider  aspirin EC 81 MG tablet Take 1 tablet (81 mg total) by mouth daily. 08/26/13   Larey Dresser, MD  doxycycline (VIBRAMYCIN) 100 MG capsule Take 1 capsule (100 mg total) by mouth 2 (two) times daily. 12/29/14   Liliane Shi, PA-C  ibuprofen (ADVIL,MOTRIN) 200 MG tablet Take 400 mg by mouth every 6 (six) hours as needed (for pain).     Historical Provider, MD  warfarin (COUMADIN) 5 MG tablet Take as directed by coumadin clinic 03/31/15   Evans Lance, MD   Triage Vitals: BP 142/91 mmHg  Pulse 113  Temp(Src) 98 F (36.7 C) (Oral)  Resp 16  Ht 5\' 2"  (1.575 m)  Wt 135 lb 6.4 oz (61.417 kg)   BMI 24.76 kg/m2  SpO2 99%  LMP 01/28/2013   Physical Exam  Constitutional: She is oriented to person, place, and time. She appears well-developed and well-nourished. No distress.  Clinically intoxicated  HENT:  Head: Normocephalic and atraumatic.  Nose: Nose normal.  Mouth/Throat: Oropharynx is clear and moist. No oropharyngeal exudate.  Eyes: Conjunctivae and EOM are normal. Pupils are equal, round, and reactive to light. No scleral icterus.  Neck: Normal range of motion. Neck supple. No JVD present. No tracheal deviation present. No thyromegaly present.  Cardiovascular: Normal rate, regular rhythm and normal heart sounds.  Exam reveals no gallop and no friction rub.   No murmur heard. Pulmonary/Chest: Effort normal and breath sounds normal. No respiratory distress. She has no wheezes. She exhibits no tenderness.  Abdominal: Soft. Bowel sounds are normal. She exhibits no distension and no mass. There is no tenderness. There is no rebound and no guarding.  Musculoskeletal: Normal range of motion. She exhibits no edema or tenderness.  Lymphadenopathy:  She has no cervical adenopathy.  Neurological: She is alert and oriented to person, place, and time. No cranial nerve deficit. She exhibits normal muscle tone.  Skin: Skin is warm and dry. No rash noted. No erythema. No pallor.  Nursing note and vitals reviewed.   ED Course  Procedures (including critical care time)  DIAGNOSTIC STUDIES: Oxygen Saturation is 99% on RA, Normal by my interpretation.    COORDINATION OF CARE: 12:50 AM- Will order CBC, CMP, PT-INR, i-stat troponin, and EKG. Will give Pepcid, fluids, Zofran, and Maalox/Mylanta. Discussed treatment plan with pt at bedside and pt agreed to plan.     Labs Review Labs Reviewed  CBC - Abnormal; Notable for the following:    Platelets 402 (*)    All other components within normal limits  COMPREHENSIVE METABOLIC PANEL - Abnormal; Notable for the following:    Total  Bilirubin 0.1 (*)    All other components within normal limits  PROTIME-INR - Abnormal; Notable for the following:    Prothrombin Time 36.7 (*)    INR 3.82 (*)    All other components within normal limits  I-STAT TROPOININ, ED  TYPE AND SCREEN    Imaging Review No results found.   EKG Interpretation None      MDM   Final diagnoses:  None   Patient presents to the emergency department for vomiting blood. This is in the setting of having INR 3.8. She takes Coumadin for me to and tricuspid valve replacements. Her goal INR is 2.5-3.5. Alcohol is known to raise the INR, she is advised to stop drinking. She will be advised to hold one dose and restart her Coumadin the following day. Primary care follow-up was advised as well. She is given 1 L bolus in emergency department. She otherwise appears well in no acute distress. Vital signs remain within her normal limits and she is safe for discharge.  I personally performed the services described in this documentation, which was scribed in my presence. The recorded information has been reviewed and is accurate.   Mary Balls, MD 04/04/15 [phone]

## 2015-04-09 NOTE — Progress Notes (Signed)
Cardiology Office Note   Date:  04/10/2015   ID:  [name], [name] 1961/04/30, MRN [id number]  PCP:  Loralie Champagne, MD  Cardiologist:  Dr. Loralie Champagne   Electrophysiologist:  Dr. Cristopher Peru   Chief Complaint  Patient presents with  . Mitral Valve Disorder     History of Present Illness: Mary [name] is a 54 y.o. 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 She underwent R mini-thoracotomy with mechanical MVR and TV repair.  Post op course c/b CHB that recovered without needing a PPM. She was kept off all AVN blocking agents. Last seen by Dr. Loralie Champagne 02/2014.  She wore a Holter monitor in 7/15 and was noted to have asymptomatic CHB while sleeping.  She saw Dr. Cristopher Peru.  Pacemaker was not recommended as CHB only occurred during sleep.  She was seen in the ED 11/25/14 with palpitations, chest pain, dyspnea and rectal bleeding.  Hemoccult was negative.  INR was < 2. Troponins were negative.    I saw her in April.  She mainly complained of palpitations with rapid heartbeat and near syncope.  Event Monitor was arranged. However, this was never done. Echocardiogram 12/2014 demonstrated normal LV function, normally functioning mechanical mitral valve. There was a small mobile echodensity attached to valvular ring. However, I reviewed this with Dr. Johnsie Cancel. It was felt to be related to redundant cord or suture from surgery.  She returns for FU.  She has been doing well. No complaints today.  She is here alone.  The patient denies chest pain, shortness of breath, syncope, orthopnea, PND or significant pedal edema. She denies any further palpitations. She decided to not pursue the event monitor.     Studies/Reports Reviewed Today:  Echo 01/01/15 - Mild focalbasal hypertrophy of the septum. EF 60% to 65%.  Wall motion was normal - Aortic valve: Trileaflet; normal thickness leaflets. There was noregurgitation. - Mitral valve: S/P MVR (St Jude mechanical valve)  that appearsfunctioning well. Mild central mitral regurgitation, noparavalvular leak. Normal transmitral gradients.  There is a small mobile echodensity attached to the vavular ringthat measures 1.5 x 1 cm. - Left atrium: The atrium was mildly dilated. - Right ventricle: Systolic function was normal. - Right atrium: The atrium was normal in size. - Tricuspid valve: Structurally normal valve. Mean gradient (D): 73mm Hg. - Pulmonary arteries: Systolic pressure was within the normalrange. Impressions:  - S/P MVR (St Jude mechanical valve) that appears functioning well.  Mild central mitral regurgitation, no paravalvular leak. Normal transmitral gradients.  There is a small mobile echodensity attached to the vavular ring that might represent postsurgical changes, however endocarditis needs to be considered. Consider TEE if clinically indicated.  Echo 7/15 - Mild concentric hypertrophy. EF 60% to 65%. Wall motion was normal.  Grade 1 diastolic dysfunction  - Aortic valve: Trileaflet; normal thickness leaflets. There was no regurgitation. - Aortic root: The aortic root was normal in size. - Mitral valve: S/P MVR (St Jude mechanical valve) and appears functioning well. Mild central mitral regurgitation, no paravalvular leak. Normal transmitral gradients. Mean gradient (D): 5 mm Hg. - Left atrium: The atrium was mildly dilated. - Right ventricle: Systolic function was mildly reduced. - Pulmonary arteries: Systolic pressure can&'t be accurately assessed.  Carotid US 8/14 Bilateral - 1% to 39% ICA stenosis   Cardiac cath 6/14 Left main: No obstructive disease.  Left Anterior Descending Artery:  No obstructive disease noted.   Circumflex  Artery:   No obstructive disease.   Right Coronary Artery:  no obstructive disease.  Left Ventricular Angiogram: LVEF 65-70%. 3+ MR Distal Aortogram: No aneurysm. No stenosis.    Past Medical History  Diagnosis Date  . Asthma     "as a baby"     . Anemia   . History of blood transfusion     "14 w/1st pregnancy; 2 w/last C-section" (03/15/2013)  . GERD (gastroesophageal reflux disease)   . Hypertension     no pcp   will go to [facility]  . Severe mitral regurgitation 04/22/2013  . SVT (supraventricular tachycardia) 03/16/2013  . Acute diastolic heart failure 7/51/0258  . 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 Tricuspid regurgitation   . S/P mitral valve replacement with metallic valve 02/20/7823    4mm Sorin Carbomedics mechanical prosthesis via right mini thoracotomy approach  . 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    Complex valvuloplasty including Cor-matrix ECM patch augmentation of anterior and lateral leaflets with 57mm Edwards mc3 ring annuloplasty via right mini-thoracotomy approach  . Complete heart block 05/28/2013    Post-op  . Epigastric hernia 200's  . Hx of echocardiogram     Echo 4/16:  Mild focal basal septal hypertrophy, EF 60-65%, no RWMA, Mechanical MVR ok with mild central regurgitation and no perivalvular leak, small mobile density attached to valvular ring (1.5x1 cm) - post surgical changes vs SBE, mild LAE  1. 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 In 8/14, had right mini-thoracotomy with mechanical mitral valve replacement (St Jude) + tricuspid valve repair. Echo (9/14): EF 55-60%, well-seated mechanical mitral valve. 2. Post-op complete heart block in 8/14, resolved.  3. LHC (6/14) with no significant coronary artery disease.  4. Right femoral hernia repair 8/14.   Past Surgical History  Procedure Laterality Date  . Appendectomy  ~1978  . Cesarean section  1983; 1999  . Laparoscopic cholecystectomy  2003  . Cesarean section with bilateral tubal ligation  1999  . Tee without cardioversion N/A 03/18/2013    Procedure: TRANSESOPHAGEAL ECHOCARDIOGRAM (TEE);  Surgeon: Larey Dresser, MD;  Location: [place];  Service: Cardiovascular;  Laterality: N/A;  . Cardiac  catheterization    . Multiple extractions with alveoloplasty N/A 04/04/2013    Procedure: Extraction of tooth #'s 1,8,9,13,14,15,23,24,25,26 with alveoloplasty and gross debridement of remaining teeth;  Surgeon: Lenn Cal, DDS;  Location: [place];  Service: Oral Surgery;  Laterality: N/A;  . Minimally invasive tricuspid valve repair Right 05/23/2013    Procedure: MINIMALLY INVASIVE TRICUSPID VALVE REPAIR;  Surgeon: Rexene Alberts, MD;  Location: [place];  Service: Open Heart Surgery;  Laterality: Right;  . Intraoperative transesophageal echocardiogram N/A 05/23/2013    Procedure: INTRAOPERATIVE TRANSESOPHAGEAL ECHOCARDIOGRAM;  Surgeon: Rexene Alberts, MD;  Location: [place];  Service: Open Heart Surgery;  Laterality: N/A;  . Femoral hernia repair Right 05/23/2013    Procedure: HERNIA REPAIR FEMORAL;  Surgeon: Rexene Alberts, MD;  Location: [place];  Service: Open Heart Surgery;  Laterality: Right;  . Mitral valve replacement N/A 05/23/2013    Procedure: MITRAL VALVE (MV) REPLACEMENT;  Surgeon: Rexene Alberts, MD;  Location: [place];  Service: Open Heart Surgery;  Laterality: N/A;  . Tee without cardioversion N/A 06/17/2013    Procedure: TRANSESOPHAGEAL ECHOCARDIOGRAM (TEE);  Surgeon: Lelon Perla, MD;  Location: [facility] ENDOSCOPY;  Service: Cardiovascular;  Laterality: N/A;  . Left and right heart catheterization with coronary angiogram N/A 03/22/2013    Procedure: LEFT  AND RIGHT HEART CATHETERIZATION WITH CORONARY ANGIOGRAM;  Surgeon: Burnell Blanks, MD;  Location: [facility] CATH LAB;  Service: Cardiovascular;  Laterality: N/A;     Current Outpatient Prescriptions  Medication Sig Dispense Refill  . aspirin EC 81 MG tablet Take 1 tablet (81 mg total) by mouth daily.    [name] ibuprofen (ADVIL,MOTRIN) 200 MG tablet Take 400 mg by mouth every 6 (six) hours as needed for moderate pain.     [name] warfarin (COUMADIN) 5 MG tablet Take as directed by coumadin clinic (Patient taking differently: Take 2.5-5 mg by  mouth daily. 2.5mg  on Monday, Wednesday and Friday.  All other days take 5mg ) 40 tablet 3   No current facility-administered medications for this visit.    Allergies:   Oxycodone    Social History:  The patient  reports that she quit smoking about 2 years ago. Her smoking use included Cigarettes. She has a 18 pack-year smoking history. She has never used smokeless tobacco. She reports that she drinks about 3.6 oz of alcohol per week. She reports that she does not use illicit drugs.   Family History:  The patient's family history includes Cancer in her mother; Sarcoidosis in her sister; Stroke in her father. There is no history of Heart attack.    ROS:   Please see the history of present illness.   Review of Systems  Hematologic/Lymphatic: Negative for bleeding problem.  Gastrointestinal: Negative for hematochezia and melena.  Genitourinary: Negative for hematuria.  All other systems reviewed and are negative.    PHYSICAL EXAM: VS:  BP 116/78 mmHg  Pulse 100  Ht 5\' 2"  (1.575 m)  Wt 135 lb 1.9 oz (61.29 kg)  BMI 24.71 kg/m2  LMP 01/28/2013    Wt Readings from Last 3 Encounters:  04/10/15 135 lb 1.9 oz (61.29 kg)  04/03/15 135 lb 6.4 oz (61.417 kg)  12/29/14 135 lb (61.236 kg)     GEN: Well nourished, well developed, in no acute distress HEENT: normal Neck: no JVD, no masses Cardiac:  Mechanical S1/Normal S2, RRR; no murmur ,  no rubs or gallops, no edema   Respiratory:  clear to auscultation bilaterally, no wheezing, rhonchi or rales. GI: soft, nontender, nondistended, + BS MS: no deformity or atrophy Skin: warm and dry  Neuro:  CNs II-XII intact, Strength and sensation are intact Psych: Normal affect   EKG:  EKG is ordered today.  It demonstrates:   NSR, HR 100, inc RBBB, no change from prior tracing   Recent Labs: 11/25/2014: B Natriuretic Peptide 36.5 12/29/2014: Pro B Natriuretic peptide (BNP) 22.0 01/01/2015: TSH 1.64 04/03/2015: ALT 17; BUN 7; Creatinine, Ser 0.95;  Hemoglobin 13.9; Platelets 402*; Potassium 5.0; Sodium 136    Lipid Panel No results found for: CHOL, TRIG, HDL, CHOLHDL, VLDL, LDLCALC, LDLDIRECT    ASSESSMENT AND PLAN:  Palpitations:  She has had a history of SVT. She's also had a history of complete heart block. Holter monitor last year demonstrated complete heart block during hours of sleep. There was no indication for pacemaker. She denies any recurrent palpitations.  She did not pursue the event monitor.  Consider pursuing this if she has a recurrence.    Complete heart block:  As noted, this resolved after her surgery. She did have complete heart block noted during hours of sleep during Holter monitor last year. This was felt to be asymptomatic and did not require pacemaker implantation.    S/P minimally-invasive mitral valve replacement with mechanical valve and tricuspid  valve repair:  Follow-up with the Coumadin clinic as planned. Recent follow-up echocardiogram with stable MVR. Continue SBE prophylaxis.   Medication Adjustments: Current medicines are reviewed at length with the patient today.  Any concerns are outlined above. The following changes have been made:   Discontinued Medications   No medications on file   Modified Medications   No medications on file   New Prescriptions   No medications on file     Labs/ tests ordered today include:  Orders Placed This Encounter  Procedures  . EKG 12-Lead    Disposition:   FU Dr. Loralie Champagne 6 mos.   Signed, Versie Starks, MHS 04/10/2015 9:02 AM    [facility] [address], [place], [place]  [address] Phone: [phone]; Fax: [phone]

## 2015-04-10 ENCOUNTER — Ambulatory Visit (INDEPENDENT_AMBULATORY_CARE_PROVIDER_SITE_OTHER): Payer: Self-pay

## 2015-04-10 ENCOUNTER — Encounter: Payer: Self-pay | Admitting: Physician Assistant

## 2015-04-10 ENCOUNTER — Ambulatory Visit (INDEPENDENT_AMBULATORY_CARE_PROVIDER_SITE_OTHER): Payer: Self-pay | Admitting: Physician Assistant

## 2015-04-10 VITALS — BP 116/78 | HR 100 | Ht 62.0 in | Wt 135.1 lb

## 2015-04-10 DIAGNOSIS — Z952 Presence of prosthetic heart valve: Secondary | ICD-10-CM

## 2015-04-10 DIAGNOSIS — I442 Atrioventricular block, complete: Secondary | ICD-10-CM

## 2015-04-10 DIAGNOSIS — Z954 Presence of other heart-valve replacement: Secondary | ICD-10-CM

## 2015-04-10 DIAGNOSIS — R002 Palpitations: Secondary | ICD-10-CM

## 2015-04-10 DIAGNOSIS — I059 Rheumatic mitral valve disease, unspecified: Secondary | ICD-10-CM

## 2015-04-10 DIAGNOSIS — Z7901 Long term (current) use of anticoagulants: Secondary | ICD-10-CM

## 2015-04-10 DIAGNOSIS — I509 Heart failure, unspecified: Secondary | ICD-10-CM

## 2015-04-10 LAB — POCT INR: INR: 2.8

## 2015-04-10 NOTE — Patient Instructions (Signed)
Medication Instructions:  No changes  Labwork: None today  Testing/Procedures: None   Follow-Up: Dr. Loralie Champagne in 6 mos.  Any Other Special Instructions Will Be Listed Below (If Applicable).

## 2015-05-01 ENCOUNTER — Ambulatory Visit (INDEPENDENT_AMBULATORY_CARE_PROVIDER_SITE_OTHER): Payer: Self-pay | Admitting: *Deleted

## 2015-05-01 DIAGNOSIS — Z7901 Long term (current) use of anticoagulants: Secondary | ICD-10-CM

## 2015-05-01 DIAGNOSIS — Z952 Presence of prosthetic heart valve: Secondary | ICD-10-CM

## 2015-05-01 DIAGNOSIS — Z954 Presence of other heart-valve replacement: Secondary | ICD-10-CM

## 2015-05-01 DIAGNOSIS — I059 Rheumatic mitral valve disease, unspecified: Secondary | ICD-10-CM

## 2015-05-01 LAB — POCT INR: INR: 4.8

## 2015-05-15 ENCOUNTER — Ambulatory Visit (INDEPENDENT_AMBULATORY_CARE_PROVIDER_SITE_OTHER): Payer: Self-pay

## 2015-05-15 DIAGNOSIS — Z952 Presence of prosthetic heart valve: Secondary | ICD-10-CM

## 2015-05-15 DIAGNOSIS — Z954 Presence of other heart-valve replacement: Secondary | ICD-10-CM

## 2015-05-15 DIAGNOSIS — I059 Rheumatic mitral valve disease, unspecified: Secondary | ICD-10-CM

## 2015-05-15 DIAGNOSIS — Z7901 Long term (current) use of anticoagulants: Secondary | ICD-10-CM

## 2015-05-15 LAB — POCT INR: INR: 2.6

## 2015-05-15 MED ORDER — WARFARIN SODIUM 5 MG PO TABS: ORAL_TABLET | ORAL | Status: DC

## 2015-06-02 ENCOUNTER — Telehealth: Payer: Self-pay | Admitting: Physician Assistant

## 2015-06-02 NOTE — Telephone Encounter (Signed)
New message      Pt is on warfarin.  [facility] told her to stop advil and take tylenol.  She needs to take a pain medication.  Which one should she take?

## 2015-06-02 NOTE — Telephone Encounter (Signed)
Spoke with pt and informed her that we recommend her take Tylenol for pain. Instructed that Advil does not increase INR but it does increase chance of GI bleed on Warfarin and she states understanding

## 2015-06-04 ENCOUNTER — Emergency Department (HOSPITAL_COMMUNITY): Payer: Self-pay

## 2015-06-04 ENCOUNTER — Emergency Department (HOSPITAL_COMMUNITY)
Admission: EM | Admit: 2015-06-04 | Discharge: 2015-06-04 | Disposition: A | Discharge status: Home / Self Care | Payer: Self-pay | Attending: Emergency Medicine | Admitting: Emergency Medicine

## 2015-06-04 ENCOUNTER — Encounter (HOSPITAL_COMMUNITY): Payer: Self-pay | Admitting: Emergency Medicine

## 2015-06-04 DIAGNOSIS — Z862 Personal history of diseases of the blood and blood-forming organs and certain disorders involving the immune mechanism: Secondary | ICD-10-CM | POA: Insufficient documentation

## 2015-06-04 DIAGNOSIS — W1839XA Other fall on same level, initial encounter: Secondary | ICD-10-CM | POA: Insufficient documentation

## 2015-06-04 DIAGNOSIS — Y9289 Other specified places as the place of occurrence of the external cause: Secondary | ICD-10-CM | POA: Insufficient documentation

## 2015-06-04 DIAGNOSIS — W19XXXA Unspecified fall, initial encounter: Secondary | ICD-10-CM

## 2015-06-04 DIAGNOSIS — Y998 Other external cause status: Secondary | ICD-10-CM | POA: Insufficient documentation

## 2015-06-04 DIAGNOSIS — Y9389 Activity, other specified: Secondary | ICD-10-CM | POA: Insufficient documentation

## 2015-06-04 DIAGNOSIS — I5031 Acute diastolic (congestive) heart failure: Secondary | ICD-10-CM | POA: Insufficient documentation

## 2015-06-04 DIAGNOSIS — J45909 Unspecified asthma, uncomplicated: Secondary | ICD-10-CM | POA: Insufficient documentation

## 2015-06-04 DIAGNOSIS — K219 Gastro-esophageal reflux disease without esophagitis: Secondary | ICD-10-CM | POA: Insufficient documentation

## 2015-06-04 DIAGNOSIS — R42 Dizziness and giddiness: Secondary | ICD-10-CM

## 2015-06-04 DIAGNOSIS — F1310 Sedative, hypnotic or anxiolytic abuse, uncomplicated: Secondary | ICD-10-CM | POA: Insufficient documentation

## 2015-06-04 DIAGNOSIS — F10120 Alcohol abuse with intoxication, uncomplicated: Secondary | ICD-10-CM | POA: Insufficient documentation

## 2015-06-04 DIAGNOSIS — S8991XA Unspecified injury of right lower leg, initial encounter: Secondary | ICD-10-CM | POA: Insufficient documentation

## 2015-06-04 DIAGNOSIS — F10920 Alcohol use, unspecified with intoxication, uncomplicated: Secondary | ICD-10-CM

## 2015-06-04 DIAGNOSIS — Z87891 Personal history of nicotine dependence: Secondary | ICD-10-CM | POA: Insufficient documentation

## 2015-06-04 DIAGNOSIS — I10 Essential (primary) hypertension: Secondary | ICD-10-CM | POA: Insufficient documentation

## 2015-06-04 DIAGNOSIS — Z7982 Long term (current) use of aspirin: Secondary | ICD-10-CM | POA: Insufficient documentation

## 2015-06-04 DIAGNOSIS — F1290 Cannabis use, unspecified, uncomplicated: Secondary | ICD-10-CM

## 2015-06-04 DIAGNOSIS — S8992XA Unspecified injury of left lower leg, initial encounter: Secondary | ICD-10-CM | POA: Insufficient documentation

## 2015-06-04 LAB — TROPONIN I

## 2015-06-04 LAB — CBC WITH DIFFERENTIAL/PLATELET
BASOS ABS: 0 10*3/uL (ref 0.0–0.1)
BASOS PCT: 1 % (ref 0–1)
EOS ABS: 0.5 10*3/uL (ref 0.0–0.7)
EOS PCT: 8 % — AB (ref 0–5)
HCT: 37.4 % (ref 36.0–46.0)
HEMOGLOBIN: 12.8 g/dL (ref 12.0–15.0)
LYMPHS ABS: 1.6 10*3/uL (ref 0.7–4.0)
Lymphocytes Relative: 24 % (ref 12–46)
MCH: 34.1 pg — AB (ref 26.0–34.0)
MCHC: 34.2 g/dL (ref 30.0–36.0)
MCV: 99.7 fL (ref 78.0–100.0)
Monocytes Absolute: 0.4 10*3/uL (ref 0.1–1.0)
Monocytes Relative: 6 % (ref 3–12)
NEUTROS PCT: 61 % (ref 43–77)
Neutro Abs: 4 10*3/uL (ref 1.7–7.7)
PLATELETS: 373 10*3/uL (ref 150–400)
RBC: 3.75 MIL/uL — AB (ref 3.87–5.11)
RDW: 13.6 % (ref 11.5–15.5)
WBC: 6.4 10*3/uL (ref 4.0–10.5)

## 2015-06-04 LAB — URINALYSIS, ROUTINE W REFLEX MICROSCOPIC
BILIRUBIN URINE: NEGATIVE
Glucose, UA: NEGATIVE mg/dL
KETONES UR: NEGATIVE mg/dL
Leukocytes, UA: NEGATIVE
Nitrite: NEGATIVE
PH: 6 (ref 5.0–8.0)
Protein, ur: 100 mg/dL — AB
SPECIFIC GRAVITY, URINE: 1.004 — AB (ref 1.005–1.030)
UROBILINOGEN UA: 0.2 mg/dL (ref 0.0–1.0)

## 2015-06-04 LAB — COMPREHENSIVE METABOLIC PANEL
ALBUMIN: 3.3 g/dL — AB (ref 3.5–5.0)
ALK PHOS: 101 U/L (ref 38–126)
ALT: 18 U/L (ref 14–54)
ANION GAP: 7 (ref 5–15)
AST: 33 U/L (ref 15–41)
BILIRUBIN TOTAL: 0.5 mg/dL (ref 0.3–1.2)
BUN: 9 mg/dL (ref 6–20)
CALCIUM: 9.2 mg/dL (ref 8.9–10.3)
CO2: 29 mmol/L (ref 22–32)
Chloride: 106 mmol/L (ref 101–111)
Creatinine, Ser: 1.25 mg/dL — ABNORMAL HIGH (ref 0.44–1.00)
GFR, EST AFRICAN AMERICAN: 56 mL/min — AB (ref >60)
GFR, EST NON AFRICAN AMERICAN: 49 mL/min — AB (ref >60)
Glucose, Bld: 91 mg/dL (ref 65–99)
POTASSIUM: 5.1 mmol/L (ref 3.5–5.1)
Sodium: 142 mmol/L (ref 135–145)
TOTAL PROTEIN: 6.9 g/dL (ref 6.5–8.1)

## 2015-06-04 LAB — RAPID URINE DRUG SCREEN, HOSP PERFORMED
Amphetamines: NOT DETECTED
BARBITURATES: NOT DETECTED
Benzodiazepines: NOT DETECTED
Cocaine: NOT DETECTED
OPIATES: NOT DETECTED
TETRAHYDROCANNABINOL: POSITIVE — AB

## 2015-06-04 LAB — URINE MICROSCOPIC-ADD ON

## 2015-06-04 LAB — PROTIME-INR
INR: 1.73 — AB (ref 0.00–1.49)
PROTHROMBIN TIME: 20.3 s — AB (ref 11.6–15.2)

## 2015-06-04 LAB — ETHANOL: ALCOHOL ETHYL (B): 215 mg/dL — AB (ref <5)

## 2015-06-04 MED ORDER — SODIUM CHLORIDE 0.9 % IV BOLUS (SEPSIS)
1000.0000 mL | Freq: Once | INTRAVENOUS | Status: AC
  Administered 2015-06-04: 1000 mL via INTRAVENOUS

## 2015-06-04 NOTE — ED Provider Notes (Signed)
CSN: [id number]     Arrival date & time 06/04/15  1422 History   First MD Initiated Contact with Patient 06/04/15 1427     Chief Complaint  Patient presents with  . Fall  . Dizziness     (Consider location/radiation/quality/duration/timing/severity/associated sxs/prior Treatment) HPI Patient reports frequently has problems with dizziness, particularly with position change or certain activities. Today she reports she was getting ready to go to work and she became dizzy in her bathroom. She also states she's been having some problems with left knee pain as well. She is not sure if she fell due to her dizziness or because her knee was bothering her and might have given out some. She fell down onto her knees but did not collapse or lose consciousness. At that point she contacted 911. She states recently she is also been noting tingling in her left hand, that has been present at least for a number of days.. Patient also identifies recent diarrheal illness. She has been having a proximal last 3-4 loose bowel movements per day. She denies chest pain. Patient is on Coumadin. She reports she has not had her Coumadin or aspirin today. Past Medical History  Diagnosis Date  . Asthma     "as a baby"   . Anemia   . History of blood transfusion     "14 w/1st pregnancy; 2 w/last C-section" (03/15/2013)  . GERD (gastroesophageal reflux disease)   . Hypertension     no pcp   will go to [facility]  . Severe mitral regurgitation 04/22/2013  . SVT (supraventricular tachycardia) 03/16/2013  . Acute diastolic heart failure 5/78/4696  . 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 Tricuspid regurgitation   . S/P mitral valve replacement with metallic valve 2/95/2841    21mm Sorin Carbomedics mechanical prosthesis via right mini thoracotomy approach  . 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    Complex valvuloplasty including Cor-matrix ECM patch augmentation of anterior and lateral leaflets with 61mm Edwards  mc3 ring annuloplasty via right mini-thoracotomy approach  . Complete heart block 05/28/2013    Post-op  . Epigastric hernia 200's  . Hx of echocardiogram     Echo 4/16:  Mild focal basal septal hypertrophy, EF 60-65%, no RWMA, Mechanical MVR ok with mild central regurgitation and no perivalvular leak, small mobile density attached to valvular ring (1.5x1 cm) - post surgical changes vs SBE, mild LAE   Past Surgical History  Procedure Laterality Date  . Appendectomy  ~1978  . Cesarean section  1983; 1999  . Laparoscopic cholecystectomy  2003  . Cesarean section with bilateral tubal ligation  1999  . Tee without cardioversion N/A 03/18/2013    Procedure: TRANSESOPHAGEAL ECHOCARDIOGRAM (TEE);  Surgeon: Larey Dresser, MD;  Location: [place];  Service: Cardiovascular;  Laterality: N/A;  . Cardiac catheterization    . Multiple extractions with alveoloplasty N/A 04/04/2013    Procedure: Extraction of tooth #'s 1,8,9,13,14,15,23,24,25,26 with alveoloplasty and gross debridement of remaining teeth;  Surgeon: Lenn Cal, DDS;  Location: [place];  Service: Oral Surgery;  Laterality: N/A;  . Minimally invasive tricuspid valve repair Right 05/23/2013    Procedure: MINIMALLY INVASIVE TRICUSPID VALVE REPAIR;  Surgeon: Rexene Alberts, MD;  Location: [place];  Service: Open Heart Surgery;  Laterality: Right;  . Intraoperative transesophageal echocardiogram N/A 05/23/2013    Procedure: INTRAOPERATIVE TRANSESOPHAGEAL ECHOCARDIOGRAM;  Surgeon: Rexene Alberts, MD;  Location: [place];  Service: Open Heart Surgery;  Laterality: N/A;  . Femoral hernia repair  Right 05/23/2013    Procedure: HERNIA REPAIR FEMORAL;  Surgeon: Rexene Alberts, MD;  Location: [place];  Service: Open Heart Surgery;  Laterality: Right;  . Mitral valve replacement N/A 05/23/2013    Procedure: MITRAL VALVE (MV) REPLACEMENT;  Surgeon: Rexene Alberts, MD;  Location: [place];  Service: Open Heart Surgery;  Laterality: N/A;  . Tee without  cardioversion N/A 06/17/2013    Procedure: TRANSESOPHAGEAL ECHOCARDIOGRAM (TEE);  Surgeon: Lelon Perla, MD;  Location: [facility] ENDOSCOPY;  Service: Cardiovascular;  Laterality: N/A;  . Left and right heart catheterization with coronary angiogram N/A 03/22/2013    Procedure: LEFT AND RIGHT HEART CATHETERIZATION WITH CORONARY ANGIOGRAM;  Surgeon: Burnell Blanks, MD;  Location: [facility] CATH LAB;  Service: Cardiovascular;  Laterality: N/A;   Family History  Problem Relation Age of Onset  . Sarcoidosis Sister   . Stroke Father   . Heart attack Neg Hx   . Cancer Mother    Social History  Substance Use Topics  . Smoking status: Former Smoker -- 0.50 packs/day for 36 years    Types: Cigarettes    Quit date: 03/15/2013  . Smokeless tobacco: Never Used  . Alcohol Use: 3.6 oz/week    6 Glasses of wine per week     Comment: 03/15/2013 "bottle of wine/wk"   OB History    No data available     Review of Systems 10 Systems reviewed and are negative for acute change except as noted in the HPI.    Allergies  Oxycodone  Home Medications   Prior to Admission medications   Medication Sig Start Date End Date Taking? Authorizing Provider  acetaminophen (TYLENOL) 500 MG tablet Take 500 mg by mouth every 6 (six) hours as needed for mild pain or moderate pain.   Yes Historical Provider, MD  aspirin EC 81 MG tablet Take 1 tablet (81 mg total) by mouth daily. 08/26/13  Yes Larey Dresser, MD  warfarin (COUMADIN) 5 MG tablet Take as directed by coumadin clinic Patient taking differently: Take 5 mg by mouth daily. Take 5 mg daily except 7.5 mg on Monday's, Wednesday's, and Friday's 05/15/15  Yes Evans Lance, MD  [facility] HAZEL EX Apply 1 application topically daily as needed (pain).   Yes Historical Provider, MD   BP 115/74 mmHg  Pulse 102  Temp(Src) 98.2 F (36.8 C) (Oral)  Resp 15  Ht 5\' 2"  (1.575 m)  Wt 135 lb (61.236 kg)  BMI 24.69 kg/m2  SpO2 98%  LMP 01/28/2013 Physical Exam   Constitutional: She is oriented to person, place, and time. She appears well-developed and well-nourished.  HENT:  Head: Normocephalic and atraumatic.  Eyes: EOM are normal. Pupils are equal, round, and reactive to light.  Neck: Neck supple.  Cardiovascular: Normal rate, regular rhythm, normal heart sounds and intact distal pulses.   Pulmonary/Chest: Effort normal and breath sounds normal.  Abdominal: Soft. Bowel sounds are normal. She exhibits no distension. There is no tenderness.  Musculoskeletal: Normal range of motion. She exhibits tenderness. She exhibits no edema.  Both knees are slightly tender to palpation over bony prominences of the patella and peripatellar region.. Left greater than right. No evident joint effusion. No gross erythema. Range of motion is intact. Nontender to compression of the calves. No peripheral edema the lower extremity.  Neurological: She is alert and oriented to person, place, and time. She has normal strength. No cranial nerve deficit. She exhibits normal muscle tone. Coordination normal. GCS eye subscore is  4. GCS verbal subscore is 5. GCS motor subscore is 6.  Skin: Skin is warm, dry and intact.  Psychiatric: She has a normal mood and affect.    ED Course  Procedures (including critical care time) Labs Review Labs Reviewed  COMPREHENSIVE METABOLIC PANEL - Abnormal; Notable for the following:    Creatinine, Ser 1.25 (*)    Albumin 3.3 (*)    GFR calc non Af Amer 49 (*)    GFR calc Af Amer 56 (*)    All other components within normal limits  ETHANOL - Abnormal; Notable for the following:    Alcohol, Ethyl (B) 215 (*)    All other components within normal limits  CBC WITH DIFFERENTIAL/PLATELET - Abnormal; Notable for the following:    RBC 3.75 (*)    MCH 34.1 (*)    Eosinophils Relative 8 (*)    All other components within normal limits  PROTIME-INR - Abnormal; Notable for the following:    Prothrombin Time 20.3 (*)    INR 1.73 (*)    All other  components within normal limits  URINALYSIS, ROUTINE W REFLEX MICROSCOPIC (NOT AT [facility]) - Abnormal; Notable for the following:    Specific Gravity, Urine 1.004 (*)    Hgb urine dipstick LARGE (*)    Protein, ur 100 (*)    All other components within normal limits  URINE RAPID DRUG SCREEN, HOSP PERFORMED - Abnormal; Notable for the following:    Tetrahydrocannabinol POSITIVE (*)    All other components within normal limits  URINE MICROSCOPIC-ADD ON - Abnormal; Notable for the following:    Squamous Epithelial / LPF FEW (*)    All other components within normal limits  TROPONIN I    Imaging Review Dg Chest 2 View  06/04/2015   CLINICAL DATA:  Dizziness with fall.  EXAM: CHEST  2 VIEW  COMPARISON:  01/01/2015  FINDINGS: Patient has had previous mitral and tricuspid repair. Heart is upper limits normal in size. Lungs are clear. No pulmonary edema. Visualized osseous structures have a normal appearance. Surgical clips are noted in the abdomen.  IMPRESSION: No evidence for acute  abnormality.   Electronically Signed   By: Nolon Nations M.D.   On: 06/04/2015 16:04   Ct Head Wo Contrast  06/04/2015   CLINICAL DATA:  Headache following fall earlier today  EXAM: CT HEAD WITHOUT CONTRAST  TECHNIQUE: Contiguous axial images were obtained from the base of the skull through the vertex without intravenous contrast.  COMPARISON:  None.  FINDINGS: The ventricles are within normal limits with respect to size and configuration. There is no intracranial mass, hemorrhage, extra-axial fluid collection, or midline shift. The gray-white compartments are normal. No acute infarct evident. The bony calvarium appears intact. The mastoid air cells are clear.  IMPRESSION: Study within normal limits.   Electronically Signed   By: Lowella Grip III M.D.   On: 06/04/2015 15:42   Dg Knee Complete 4 Views Left  06/04/2015   CLINICAL DATA:  Pain.  Recent fall.  EXAM: LEFT KNEE - COMPLETE 4+ VIEW  COMPARISON:  None.   FINDINGS: Frontal, lateral, and bilateral oblique views were obtained. There is no demonstrable fracture or dislocation. No appreciable effusion. Joint spaces appear intact. No erosive change. Benign appearing sclerotic focus is identified in the distal femur.  IMPRESSION: No fracture or joint effusion.  No appreciable arthropathic change.   Electronically Signed   By: Lowella Grip III M.D.   On: 06/04/2015 16:02  I have personally reviewed and evaluated these images and lab results as part of my medical decision-making.   EKG Interpretation   Date/Time:  Thursday June 04 2015 14:31:03 EDT Ventricular Rate:  103 PR Interval:  161 QRS Duration: 105 QT Interval:  379 QTC Calculation: 496 R Axis:   -49 Text Interpretation:  Sinus tachycardia Consider right atrial enlargement  Abnormal R-wave progression, early transition Inferior infarct, old ogree  no sig change from prior Confirmed by Johnney Killian, MD, Jeannie Done [phone]) on  06/04/2015 2:42:14 PM     Recheck 1500 patient reports she feels much improved. She no longer feels dizzy and has no pain complaints. MDM   Final diagnoses:  Dizziness  Alcohol intoxication, uncomplicated  Marijuana use  Fall, initial encounter   The patient presents with a fall in her bathroom. At this time dizziness may be due to underlying vertiginous symptoms however the patient also is positive for alcohol and marijuana. These may be exacerbating her chronic dizziness as described by the patient. She also describes several days of increased loose stool, contributing to volume depletion. She is alert and appropriate. Her neurologic examination is normal. There are no signs of confusion or incoordination. Patient's knee exam does not show any acute findings. At this time I do feel she is safe for continued outpatient management. Fluid hydration has significantly improved. She has follow up with the Coumadin clinic tomorrow and is advised to take a 7.5 dose  today.    Charlesetta Shanks, MD 06/04/15 [phone]

## 2015-06-04 NOTE — ED Notes (Signed)
EKG completed given to EDP.  

## 2015-06-04 NOTE — ED Notes (Signed)
Patient called EMS fell unknown syncope states bilateral knee pain 6/10 achy. Have been feeling dizzy with left hand tingling "little while"  Patient alert answering and following commands appropriate upon arrival.

## 2015-06-04 NOTE — Discharge Instructions (Signed)
Dizziness °Dizziness is a common problem. It is a feeling of unsteadiness or light-headedness. You may feel like you are about to faint. Dizziness can lead to injury if you stumble or fall. A person of any age group can suffer from dizziness, but dizziness is more common in older adults. °CAUSES  °Dizziness can be caused by many different things, including: °· Middle ear problems. °· Standing for too long. °· Infections. °· An allergic reaction. °· Aging. °· An emotional response to something, such as the sight of blood. °· Side effects of medicines. °· Tiredness. °· Problems with circulation or blood pressure. °· Excessive use of alcohol or medicines, or illegal drug use. °· Breathing too fast (hyperventilation). °· An irregular heart rhythm (arrhythmia). °· A low red blood cell count (anemia). °· Pregnancy. °· Vomiting, diarrhea, fever, or other illnesses that cause body fluid loss (dehydration). °· Diseases or conditions such as Parkinson's disease, high blood pressure (hypertension), diabetes, and thyroid problems. °· Exposure to extreme heat. °DIAGNOSIS  °Your health care provider will ask about your symptoms, perform a physical exam, and perform an electrocardiogram (ECG) to record the electrical activity of your heart. Your health care provider may also perform other heart or blood tests to determine the cause of your dizziness. These may include: °· Transthoracic echocardiogram (TTE). During echocardiography, sound waves are used to evaluate how blood flows through your heart. °· Transesophageal echocardiogram (TEE). °· Cardiac monitoring. This allows your health care provider to monitor your heart rate and rhythm in real time. °· Holter monitor. This is a portable device that records your heartbeat and can help diagnose heart arrhythmias. It allows your health care provider to track your heart activity for several days if needed. °· Stress tests by exercise or by giving medicine that makes the heart beat  faster. °TREATMENT  °Treatment of dizziness depends on the cause of your symptoms and can vary greatly. °HOME CARE INSTRUCTIONS  °· Drink enough fluids to keep your urine clear or pale yellow. This is especially important in very hot weather. In older adults, it is also important in cold weather. °· Take your medicine exactly as directed if your dizziness is caused by medicines. When taking blood pressure medicines, it is especially important to get up slowly. °· Rise slowly from chairs and steady yourself until you feel okay. °· In the morning, first sit up on the side of the bed. When you feel okay, stand slowly while holding onto something until you know your balance is fine. °· Move your legs often if you need to stand in one place for a long time. Tighten and relax your muscles in your legs while standing. °· Have someone stay with you for 1-2 days if dizziness continues to be a problem. Do this until you feel you are well enough to stay alone. Have the person call your health care provider if he or she notices changes in you that are concerning. °· Do not drive or use heavy machinery if you feel dizzy. °· Do not drink alcohol. °SEEK IMMEDIATE MEDICAL CARE IF:  °· Your dizziness or light-headedness gets worse. °· You feel nauseous or vomit. °· You have problems talking, walking, or using your arms, hands, or legs. °· You feel weak. °· You are not thinking clearly or you have trouble forming sentences. It may take a friend or family member to notice this. °· You have chest pain, abdominal pain, shortness of breath, or sweating. °· Your vision changes. °· You notice   any bleeding.  You have side effects from medicine that seems to be getting worse rather than better. MAKE SURE YOU:   Understand these instructions.  Will watch your condition.  Will get help right away if you are not doing well or get worse. Document Released: 03/08/2001 Document Revised: 09/17/2013 Document Reviewed: 04/01/2011 [facility]  Patient Information 2015 [place], [place]. This information is not intended to replace advice given to you by your health care provider. Make sure you discuss any questions you have with your health care provider.   Alcohol Intoxication Alcohol intoxication occurs when you drink enough alcohol that it affects your ability to function. It can be mild or very severe. Drinking a lot of alcohol in a short time is called binge drinking. This can be very harmful. Drinking alcohol can also be more dangerous if you are taking medicines or other drugs. Some of the effects caused by alcohol may include:  Loss of coordination.  Changes in mood and behavior.  Unclear thinking.  Trouble talking (slurred speech).  Throwing up (vomiting).  Confusion.  Slowed breathing.  Twitching and shaking (seizures).  Loss of consciousness. HOME CARE  Do not drive after drinking alcohol.  Drink enough water and fluids to keep your pee (urine) clear or pale yellow. Avoid caffeine.  Only take medicine as told by your doctor. GET HELP IF:  You throw up (vomit) many times.  You do not feel better after a few days.  You frequently have alcohol intoxication. Your doctor can help decide if you should see a substance use treatment counselor. GET HELP RIGHT AWAY IF:  You become shaky when you stop drinking.  You have twitching and shaking.  You throw up blood. It may look bright red or like coffee grounds.  You notice blood in your poop (bowel movements).  You become lightheaded or pass out (faint). MAKE SURE YOU:   Understand these instructions.  Will watch your condition.  Will get help right away if you are not doing well or get worse. Document Released: 02/29/2008 Document Revised: 05/15/2013 Document Reviewed: 02/15/2013 [facility] Patient Information 2015 [place], [place]. This information is not intended to replace advice given to you by your health care provider. Make sure you discuss any  questions you have with your health care provider.  Emergency Department Resource Guide 1) Find a Doctor and Pay Out of Pocket Although you won't have to find out who is covered by your insurance plan, it is a good idea to ask around and get recommendations. You will then need to call the office and see if the doctor you have chosen will accept you as a new patient and what types of options they offer for patients who are self-pay. Some doctors offer discounts or will set up payment plans for their patients who do not have insurance, but you will need to ask so you aren't surprised when you get to your appointment.  2) Contact Your Local Health Department Not all health departments have doctors that can see patients for sick visits, but many do, so it is worth a call to see if yours does. If you don't know where your local health department is, you can check in your phone book. The CDC also has a tool to help you locate your state's health department, and many state websites also have listings of all of their local health departments.  3) Find a [facility] If your illness is not likely to be very severe or complicated, you may  want to try a walk in clinic. These are popping up all over the country in pharmacies, drugstores, and shopping centers. They're usually staffed by nurse practitioners or physician assistants that have been trained to treat common illnesses and complaints. They're usually fairly quick and inexpensive. However, if you have serious medical issues or chronic medical problems, these are probably not your best option.  No Primary Care Doctor: - Call Health Connect at  [phone] - they can help you locate a primary care doctor that  accepts your insurance, provides certain services, etc. - Physician Referral Service- [phone]  Chronic Pain Problems: Organization         Address  Phone   Notes  [facility]  [phone] Patients need to be referred by  their primary care doctor.   Medication Assistance: Organization         Address  Phone   Notes  [facility] [address] [place], [place] [address] [phone] --Must be a resident of [facility] -- Must have NO insurance coverage whatsoever (no Medicaid/ Medicare, etc.) -- The pt. MUST have a primary care doctor that directs their care regularly and follows them in the community   MedAssist  [phone]   [employer]  [phone]    Agencies that provide inexpensive medical care: Organization         Address  Phone   Notes  [place]  [phone]   [facility]    [phone]   [facility] [place], [place] [address] [phone]   [place] [address], [place] [phone]   Planned Parenthood    [phone]   [facility]    [phone]   [place] and [place] [address], [place] Phone:  [phone], Fax:  [phone] Hours of Operation:  9 am - 6 pm, M-F.  Also accepts Medicaid/Medicare and self-pay.  [facility] for [address] [address] [place], [address], [place] Phone: [phone], Fax: [phone]. Hours of Operation:  8:30 am - 5:30 pm, M-F.  Also accepts Medicaid and self-pay.  [facility] [place] [address] [place] Phone: [phone]   [place], [place], [place] [phone], Ext. [phone] Mondays & Thursdays: 7-9 AM.  First 15 patients are seen on a first come, first serve basis.    [place] Providers:  Organization         Address  Phone   Notes  [facility] [address], [place] [phone] Also accepts self-pay patients.  [facility] [address], [place]  [phone]   [place], [address], [place] [phone]   [facility] [address], [place] [phone]   Lucianne Lei [address], [place]   [phone] Only accepts [place] Access [place] patients after they have their name applied to their card.   Self-Pay (no insurance) in [facility]:  Organization         Address  Phone   Notes  Sickle Cell Patients, [facility] [place] [phone]   [facility] Urgent Care [place] [phone])  [facility] [place]  [address], [address], [place] [phone]   [facility]/Dr. Osei-Bonsu  [address], [place] or [address], [place] [phone] Phone number for both [place] and [place] locations is the same.  [facility] [address] [place] [phone]   [facility] [address], [place] or [address] [phone]   [facility] [address] [place] [phone], phone; [phone], fax Sees patients 1st and 3rd Saturday of every month.  Must not qualify for public or private insurance (i.e. Medicaid, Medicare, [place] Health Choice, Veterans' Benefits)  Household income should be no more than 200% of the poverty level The clinic cannot treat you if you are pregnant or think you are pregnant  Sexually transmitted diseases are not treated at the clinic.    Dental Care: Organization         Address  Phone  Notes  [facility] Department of [facility] [place] [phone] Accepts children up to age 78 who are enrolled in [place] or [place]; pregnant women with a Medicaid card; and children who have applied for Medicaid or [place] Health Choice, but were declined, whose parents can pay a reduced  fee at time of service.  [facility] Department of [facility]  [address], [place] [phone] Accepts children up to age 9 who are enrolled in [place] or [place]; pregnant women with a Medicaid card; and children who have applied for Medicaid or [place] Health Choice, but were declined, whose parents can pay a reduced fee at time of service.  [place] Adult Dental Access PROGRAM  [place] [phone] Patients are seen by appointment only. Walk-ins are not accepted. [place] will see patients 90 years of age and older. Monday - Tuesday (8am-5pm) Most Wednesdays (8:30-5pm) $30 per visit, cash only  [facility] Adult Dental Access PROGRAM  [address], [place] [facility] [phone] Patients are seen by appointment only. Walk-ins are not accepted. [place] will see patients 49 years of age and older. One Wednesday Evening (Monthly: Volunteer Based).  $30 per visit, cash only  [place]  [phone] for adults; Children under age 76, call Graduate Pediatric Dentistry at [phone]. Children aged 74-14, please call [phone] to request a pediatric application.  Dental services are provided in all areas of dental care including fillings, crowns and bridges, complete and partial dentures, implants, gum treatment, root canals, and extractions. Preventive care is also provided. Treatment is provided to both adults and children. Patients are selected via a lottery and there is often a waiting list.   [facility] [address], [place]  [phone] [email or web address]   [facility] [address] [place], [place] [phone], Ext. [phone] Second and Fourth Thursday of each month, opens at 6:30 AM; Clinic ends at 9 AM.  Patients are seen on a first-come first-served basis, and a limited number are seen during each clinic.   [facility]  [address] Hillard Danker [place], [place] [phone]   Eligibility Requirements You must have lived in [place], [place], or [place] counties for at least the last three months.   You cannot be eligible for state  or Museum/gallery conservator, including [employer], [place], or [employer].   You generally cannot be eligible for healthcare insurance through your employer.    How to apply: Eligibility screenings are held every Tuesday and Wednesday afternoon from 1:00 pm until 4:00 pm. You do not need an appointment for the interview!  [facility] [address] [place], [place]   [place]  [place] Department  [place]  [phone]    Behavioral Health Resources in the Community: Intensive Outpatient Programs Organization         Address  Phone  Notes  [place]. [address], [place], [place] [phone]   [facility]patient [address], [place], [place]   ADS: Alcohol & Drug Svcs [address] [place], [place]   [place] [address]  [place], [place] or [phone]   Substance Abuse Resources Organization         Address  Phone  Notes  Alcohol and Drug Services  [phone]   [place]  [phone]   [facility] [place]   Chinita Pester  [phone]   Residential & Outpatient Substance Abuse Program  [phone]   Psychological Services Organization         Address  Phone  Notes  [facility] [place]  [place]  [phone]   [place] [address], [place] or [phone]    Mobile Crisis Teams Organization         Address  Phone  Notes  [facility], Mobile Crisis Care Unit  [phone]   Assertive Psychotherapeutic  Services  [address] [place], [place]   [place] [address], [place] [phone]    Self-Help/Support Groups Organization         Address  Phone             Notes  [place]. of [place] - variety of support groups  [place] Call for more information  Narcotics Anonymous (NA), [facility] [address], [employer] [place]  2 meetings at this location   Special educational needs teacher         Address  Phone  Notes  [facility]AP Residential Treatment [place],    [place]  [phone]   [facility]  [address] [place] [phone], [place], [place]   [place], [place] [phone] Admissions: 8am-3pm M-F  Incentives Substance [place] [address]    [place], [place] [phone]   [facility] [address] Jadene Pierini [employer], [place]   [facility] [address],  [place], [place]   Insight Programs - Intensive Outpatient [address]een [address], [place], [place]   [facility] [place] ([place].) [place].,  [place], [place] [phone] or [phone]   [facility] (RTS) [address] [place], [place] Accepts Medicaid  [facility] [place] [address],  [place] [phone] Substance Abuse/Addiction Treatment   [facility] Organization         Address  Phone  Notes  [facility]  [phone]   Domenic Schwab, PhD [place], [address] [place], [place]   [phone] or [phone]) [phone]   [facility]   [address] [place], [place] [phone]   [place], [place], [place] [phone] Insurance/Medicaid/sponsorship through [facility] and Families [address], [address] [place]                                    [place], [place] [phone] [place] [address]   [place], [place] [phone]    Dr. Adele Schilder  [phone]   [facility] Dept. 1) [address], [place] 2) [place] 3)  [place], [place] [phone]  [phone]   [place] [phone] or [phone] (After Hours)

## 2015-07-31 ENCOUNTER — Encounter (HOSPITAL_COMMUNITY): Payer: Self-pay | Admitting: Emergency Medicine

## 2015-07-31 ENCOUNTER — Emergency Department (HOSPITAL_COMMUNITY)
Admission: EM | Admit: 2015-07-31 | Discharge: 2015-07-31 | Disposition: A | Discharge status: Home / Self Care | Payer: Medicaid Other | Attending: Emergency Medicine | Admitting: Emergency Medicine

## 2015-07-31 DIAGNOSIS — I5031 Acute diastolic (congestive) heart failure: Secondary | ICD-10-CM | POA: Insufficient documentation

## 2015-07-31 DIAGNOSIS — Z7982 Long term (current) use of aspirin: Secondary | ICD-10-CM | POA: Diagnosis not present

## 2015-07-31 DIAGNOSIS — Z87891 Personal history of nicotine dependence: Secondary | ICD-10-CM | POA: Insufficient documentation

## 2015-07-31 DIAGNOSIS — Z862 Personal history of diseases of the blood and blood-forming organs and certain disorders involving the immune mechanism: Secondary | ICD-10-CM | POA: Diagnosis not present

## 2015-07-31 DIAGNOSIS — Z7901 Long term (current) use of anticoagulants: Secondary | ICD-10-CM | POA: Insufficient documentation

## 2015-07-31 DIAGNOSIS — I10 Essential (primary) hypertension: Secondary | ICD-10-CM | POA: Insufficient documentation

## 2015-07-31 DIAGNOSIS — Z952 Presence of prosthetic heart valve: Secondary | ICD-10-CM | POA: Diagnosis not present

## 2015-07-31 DIAGNOSIS — Z8719 Personal history of other diseases of the digestive system: Secondary | ICD-10-CM | POA: Diagnosis not present

## 2015-07-31 DIAGNOSIS — Z9889 Other specified postprocedural states: Secondary | ICD-10-CM | POA: Insufficient documentation

## 2015-07-31 DIAGNOSIS — J45909 Unspecified asthma, uncomplicated: Secondary | ICD-10-CM | POA: Insufficient documentation

## 2015-07-31 DIAGNOSIS — K920 Hematemesis: Secondary | ICD-10-CM | POA: Insufficient documentation

## 2015-07-31 LAB — PROTIME-INR
INR: 3.29 — AB (ref 0.00–1.49)
Prothrombin Time: 32.8 seconds — ABNORMAL HIGH (ref 11.6–15.2)

## 2015-07-31 LAB — CBC
HCT: 36.3 % (ref 36.0–46.0)
HEMOGLOBIN: 12.3 g/dL (ref 12.0–15.0)
MCH: 34.2 pg — ABNORMAL HIGH (ref 26.0–34.0)
MCHC: 33.9 g/dL (ref 30.0–36.0)
MCV: 100.8 fL — ABNORMAL HIGH (ref 78.0–100.0)
Platelets: 342 10*3/uL (ref 150–400)
RBC: 3.6 MIL/uL — ABNORMAL LOW (ref 3.87–5.11)
RDW: 13.6 % (ref 11.5–15.5)
WBC: 7.6 10*3/uL (ref 4.0–10.5)

## 2015-07-31 LAB — POC OCCULT BLOOD, ED: Fecal Occult Bld: NEGATIVE

## 2015-07-31 LAB — COMPREHENSIVE METABOLIC PANEL
ALBUMIN: 3.5 g/dL (ref 3.5–5.0)
ALK PHOS: 108 U/L (ref 38–126)
ALT: 14 U/L (ref 14–54)
ANION GAP: 6 (ref 5–15)
AST: 30 U/L (ref 15–41)
BILIRUBIN TOTAL: 0.4 mg/dL (ref 0.3–1.2)
BUN: 10 mg/dL (ref 6–20)
CALCIUM: 8.6 mg/dL — AB (ref 8.9–10.3)
CO2: 21 mmol/L — AB (ref 22–32)
CREATININE: 0.94 mg/dL (ref 0.44–1.00)
Chloride: 113 mmol/L — ABNORMAL HIGH (ref 101–111)
GFR calc Af Amer: >60 mL/min (ref >60)
GFR calc non Af Amer: >60 mL/min (ref >60)
GLUCOSE: 79 mg/dL (ref 65–99)
Potassium: 4 mmol/L (ref 3.5–5.1)
SODIUM: 140 mmol/L (ref 135–145)
TOTAL PROTEIN: 6.8 g/dL (ref 6.5–8.1)

## 2015-07-31 LAB — TYPE AND SCREEN
ABO/RH(D): O POS
ANTIBODY SCREEN: NEGATIVE

## 2015-07-31 MED ORDER — AMOXICILLIN 500 MG PO CAPS: 500.0000 mg | ORAL_CAPSULE | Freq: Three times a day (TID) | ORAL | Status: DC

## 2015-07-31 MED ORDER — RANITIDINE HCL 150 MG PO CAPS: 150.0000 mg | ORAL_CAPSULE | Freq: Every day | ORAL | Status: DC

## 2015-07-31 MED ORDER — ONDANSETRON HCL 4 MG/2ML IJ SOLN
4.0000 mg | Freq: Once | INTRAMUSCULAR | Status: AC
  Administered 2015-07-31: 4 mg via INTRAVENOUS
  Filled 2015-07-31: qty 2

## 2015-07-31 NOTE — Discharge Instructions (Signed)
Hold your coumadin today.  Follow up with your md next week.

## 2015-07-31 NOTE — ED Notes (Signed)
Bed: WA07 Expected date:  Expected time:  Means of arrival:  Comments: 

## 2015-07-31 NOTE — ED Provider Notes (Signed)
CSN: [id number]     Arrival date & time 07/31/15  0848 History   First MD Initiated Contact with Patient 07/31/15 [phone]     Chief Complaint  Patient presents with  . Hematemesis     (Consider location/radiation/quality/duration/timing/severity/associated sxs/prior Treatment) Patient is a 54 y.o. female presenting with vomiting. The history is provided by the patient (Patient states that she's been depressed and drinking a lot of alcohol and not eating anything. She states she got nauseated and started throwing up after about the third time she started throwing up some blood she's had no blood or black stools).  Emesis Severity:  Moderate Timing:  Constant Quality:  Bright red blood Able to tolerate:  Liquids Progression:  Unchanged Chronicity:  New Recent urination:  Increased Context: not post-tussive   Associated symptoms: no abdominal pain, no diarrhea and no headaches     Past Medical History  Diagnosis Date  . Asthma     "as a baby"   . Anemia   . History of blood transfusion     "14 w/1st pregnancy; 2 w/last C-section" (03/15/2013)  . GERD (gastroesophageal reflux disease)   . Hypertension     no pcp   will go to [facility]  . Severe mitral regurgitation 04/22/2013  . SVT (supraventricular tachycardia) ([place]) 03/16/2013  . Acute diastolic heart failure ([place]) 03/15/2013  . 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 Tricuspid regurgitation   . S/P mitral valve replacement with metallic valve 9/92/4268    63mm Sorin Carbomedics mechanical prosthesis via right mini thoracotomy approach  . 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 Complete heart block ([place]) 05/28/2013    Post-op  . Epigastric hernia 200's  . Hx of echocardiogram     Echo 4/16:  Mild focal basal septal hypertrophy, EF 60-65%, no  RWMA, Mechanical MVR ok with mild central regurgitation and no perivalvular leak, small mobile density attached to valvular ring (1.5x1 cm) - post surgical changes vs SBE, mild LAE   Past Surgical History  Procedure Laterality Date  . Appendectomy  ~1978  . Cesarean section  1983; 1999  . Laparoscopic cholecystectomy  2003  . Cesarean section with bilateral tubal ligation  1999  . Tee without cardioversion N/A 03/18/2013    Procedure: TRANSESOPHAGEAL ECHOCARDIOGRAM (TEE);  Surgeon: Larey Dresser, MD;  Location: [place];  Service: Cardiovascular;  Laterality: N/A;  . Cardiac catheterization    . Multiple extractions with alveoloplasty N/A 04/04/2013    Procedure: Extraction of tooth #'s 1,8,9,13,14,15,23,24,25,26 with alveoloplasty and gross debridement of remaining teeth;  Surgeon: Lenn Cal, DDS;  Location: [place];  Service: Oral Surgery;  Laterality: N/A;  . Minimally invasive tricuspid valve repair Right 05/23/2013    Procedure: MINIMALLY INVASIVE TRICUSPID VALVE REPAIR;  Surgeon: Rexene Alberts, MD;  Location: [place];  Service: Open Heart Surgery;  Laterality: Right;  . Intraoperative transesophageal echocardiogram N/A 05/23/2013    Procedure: INTRAOPERATIVE TRANSESOPHAGEAL ECHOCARDIOGRAM;  Surgeon: Rexene Alberts, MD;  Location: [place];  Service: Open Heart Surgery;  Laterality: N/A;  . Femoral hernia repair Right 05/23/2013    Procedure: HERNIA REPAIR FEMORAL;  Surgeon: Rexene Alberts, MD;  Location: [place];  Service: Open Heart Surgery;  Laterality: Right;  . Mitral valve replacement N/A 05/23/2013    Procedure: MITRAL VALVE (MV) REPLACEMENT;  Surgeon: Valentina Gu  Roxy Manns, MD;  Location: [place];  Service: Open Heart Surgery;  Laterality: N/A;  . Tee without cardioversion N/A 06/17/2013    Procedure: TRANSESOPHAGEAL ECHOCARDIOGRAM (TEE);  Surgeon: Lelon Perla, MD;  Location: [facility] ENDOSCOPY;  Service: Cardiovascular;  Laterality: N/A;  . Left and right heart catheterization with  coronary angiogram N/A 03/22/2013    Procedure: LEFT AND RIGHT HEART CATHETERIZATION WITH CORONARY ANGIOGRAM;  Surgeon: Burnell Blanks, MD;  Location: [facility] CATH LAB;  Service: Cardiovascular;  Laterality: N/A;   Family History  Problem Relation Age of Onset  . Sarcoidosis Sister   . Stroke Father   . Heart attack Neg Hx   . Cancer Mother    Social History  Substance Use Topics  . Smoking status: Former Smoker -- 0.50 packs/day for 36 years    Types: Cigarettes    Quit date: 03/15/2013  . Smokeless tobacco: Never Used  . Alcohol Use: 3.6 oz/week    6 Glasses of wine per week     Comment: 03/15/2013 "bottle of wine/wk"   OB History    No data available     Review of Systems  Constitutional: Negative for appetite change and fatigue.  HENT: Negative for congestion, ear discharge and sinus pressure.   Eyes: Negative for discharge.  Respiratory: Negative for cough.   Cardiovascular: Negative for chest pain.  Gastrointestinal: Positive for vomiting. Negative for abdominal pain and diarrhea.  Genitourinary: Negative for frequency and hematuria.  Musculoskeletal: Negative for back pain.  Skin: Negative for rash.  Neurological: Negative for seizures and headaches.  Psychiatric/Behavioral: Negative for hallucinations.      Allergies  Oxycodone  Home Medications   Prior to Admission medications   Medication Sig Start Date End Date Taking? Authorizing Provider  acetaminophen (TYLENOL) 500 MG tablet Take 500 mg by mouth every 6 (six) hours as needed for mild pain or moderate pain.   Yes Historical Provider, MD  aspirin EC 81 MG tablet Take 1 tablet (81 mg total) by mouth daily. 08/26/13  Yes Larey Dresser, MD  warfarin (COUMADIN) 5 MG tablet Take as directed by coumadin clinic Patient taking differently: Take 5 mg by mouth daily. Take 5 mg daily except 7.5 mg on Monday's, Wednesday's, and Friday's 05/15/15  Yes Evans Lance, MD  [facility] HAZEL EX Apply 1 application topically  daily as needed (pain).   Yes Historical Provider, MD   BP 114/69 mmHg  Pulse 95  Temp(Src) 98 F (36.7 C) (Oral)  Resp 18  Ht 5\' 2"  (1.575 m)  Wt 138 lb (62.596 kg)  BMI 25.23 kg/m2  SpO2 100%  LMP 01/28/2013 Physical Exam  Constitutional: She is oriented to person, place, and time. She appears well-developed.  HENT:  Head: Normocephalic.  Eyes: Conjunctivae and EOM are normal. No scleral icterus.  Neck: Neck supple. No thyromegaly present.  Cardiovascular: Normal rate and regular rhythm.  Exam reveals no gallop and no friction rub.   No murmur heard. Pulmonary/Chest: No stridor. She has no wheezes. She has no rales. She exhibits no tenderness.  Abdominal: She exhibits no distension. There is no tenderness. There is no rebound.  Musculoskeletal: Normal range of motion. She exhibits no edema.  Lymphadenopathy:    She has no cervical adenopathy.  Neurological: She is oriented to person, place, and time. She exhibits normal muscle tone. Coordination normal.  Skin: No rash noted. No erythema.  Psychiatric: She has a normal mood and affect. Her behavior is normal.    ED Course  Procedures (including critical care time) Labs Review Labs Reviewed  COMPREHENSIVE METABOLIC PANEL - Abnormal; Notable for the following:    Chloride 113 (*)    CO2 21 (*)    Calcium 8.6 (*)    All other components within normal limits  CBC - Abnormal; Notable for the following:    RBC 3.60 (*)    MCV 100.8 (*)    MCH 34.2 (*)    All other components within normal limits  PROTIME-INR - Abnormal; Notable for the following:    Prothrombin Time 32.8 (*)    INR 3.29 (*)    All other components within normal limits  URINE CULTURE  POC OCCULT BLOOD, ED  TYPE AND SCREEN    Imaging Review No results found. I have personally reviewed and evaluated these images and lab results as part of my medical decision-making.   EKG Interpretation None      MDM   Final diagnoses:  Hematemesis with nausea     Labs unremarkable patient orthostatic PT level 3.3.  Patient states she has not been taking Coumadin for 2 days. I suspect her level was elevated which contributed to her vomiting of blood. I have started the patient on Zantac I have told her to hold her Coumadin tonight. She is to follow-up with her family doctor. She is to return here if she starts throwing up blood again. Patient will also be put on amoxicillin for urinary tract symptoms the urine culture is pending    Milton Ferguson, MD 07/31/15 [phone]

## 2015-07-31 NOTE — ED Notes (Signed)
Per pt, states vomiting blood since yesterday-states blood in stool last week-states out of coumadin for 2 days

## 2015-08-01 LAB — URINE CULTURE: Special Requests: NORMAL

## 2015-09-12 ENCOUNTER — Emergency Department (HOSPITAL_COMMUNITY): Payer: Medicaid Other

## 2015-09-12 ENCOUNTER — Observation Stay (HOSPITAL_COMMUNITY)
Admission: EM | Admit: 2015-09-12 | Discharge: 2015-09-13 | Discharge status: Against Medical Advice | Payer: Medicaid Other | Attending: Internal Medicine | Admitting: Internal Medicine

## 2015-09-12 ENCOUNTER — Encounter (HOSPITAL_COMMUNITY): Payer: Self-pay | Admitting: Oncology

## 2015-09-12 DIAGNOSIS — Z8249 Family history of ischemic heart disease and other diseases of the circulatory system: Secondary | ICD-10-CM | POA: Diagnosis not present

## 2015-09-12 DIAGNOSIS — Z7901 Long term (current) use of anticoagulants: Secondary | ICD-10-CM | POA: Diagnosis not present

## 2015-09-12 DIAGNOSIS — D649 Anemia, unspecified: Secondary | ICD-10-CM | POA: Diagnosis not present

## 2015-09-12 DIAGNOSIS — I471 Supraventricular tachycardia: Secondary | ICD-10-CM | POA: Insufficient documentation

## 2015-09-12 DIAGNOSIS — I071 Rheumatic tricuspid insufficiency: Secondary | ICD-10-CM | POA: Diagnosis not present

## 2015-09-12 DIAGNOSIS — Z87891 Personal history of nicotine dependence: Secondary | ICD-10-CM | POA: Insufficient documentation

## 2015-09-12 DIAGNOSIS — Z8679 Personal history of other diseases of the circulatory system: Secondary | ICD-10-CM

## 2015-09-12 DIAGNOSIS — I340 Nonrheumatic mitral (valve) insufficiency: Secondary | ICD-10-CM | POA: Diagnosis not present

## 2015-09-12 DIAGNOSIS — Z952 Presence of prosthetic heart valve: Secondary | ICD-10-CM | POA: Diagnosis not present

## 2015-09-12 DIAGNOSIS — D75839 Thrombocytosis, unspecified: Secondary | ICD-10-CM | POA: Diagnosis present

## 2015-09-12 DIAGNOSIS — R29898 Other symptoms and signs involving the musculoskeletal system: Secondary | ICD-10-CM

## 2015-09-12 DIAGNOSIS — I10 Essential (primary) hypertension: Secondary | ICD-10-CM | POA: Diagnosis not present

## 2015-09-12 DIAGNOSIS — I6330 Cerebral infarction due to thrombosis of unspecified cerebral artery: Secondary | ICD-10-CM

## 2015-09-12 DIAGNOSIS — Z7982 Long term (current) use of aspirin: Secondary | ICD-10-CM | POA: Insufficient documentation

## 2015-09-12 DIAGNOSIS — D473 Essential (hemorrhagic) thrombocythemia: Secondary | ICD-10-CM | POA: Diagnosis not present

## 2015-09-12 DIAGNOSIS — Z79899 Other long term (current) drug therapy: Secondary | ICD-10-CM | POA: Diagnosis not present

## 2015-09-12 DIAGNOSIS — G459 Transient cerebral ischemic attack, unspecified: Principal | ICD-10-CM | POA: Diagnosis present

## 2015-09-12 DIAGNOSIS — I099 Rheumatic heart disease, unspecified: Secondary | ICD-10-CM | POA: Insufficient documentation

## 2015-09-12 DIAGNOSIS — Z885 Allergy status to narcotic agent status: Secondary | ICD-10-CM | POA: Diagnosis not present

## 2015-09-12 DIAGNOSIS — I442 Atrioventricular block, complete: Secondary | ICD-10-CM | POA: Diagnosis not present

## 2015-09-12 DIAGNOSIS — Z954 Presence of other heart-valve replacement: Secondary | ICD-10-CM

## 2015-09-12 DIAGNOSIS — I639 Cerebral infarction, unspecified: Secondary | ICD-10-CM | POA: Insufficient documentation

## 2015-09-12 DIAGNOSIS — K219 Gastro-esophageal reflux disease without esophagitis: Secondary | ICD-10-CM | POA: Insufficient documentation

## 2015-09-12 LAB — DIFFERENTIAL
BASOS ABS: 0.1 10*3/uL (ref 0.0–0.1)
BASOS PCT: 1 %
Eosinophils Absolute: 0.3 10*3/uL (ref 0.0–0.7)
Eosinophils Relative: 5 %
Lymphocytes Relative: 19 %
Lymphs Abs: 1.4 10*3/uL (ref 0.7–4.0)
Monocytes Absolute: 0.4 10*3/uL (ref 0.1–1.0)
Monocytes Relative: 6 %
NEUTROS ABS: 4.9 10*3/uL (ref 1.7–7.7)
NEUTROS PCT: 69 %

## 2015-09-12 LAB — COMPREHENSIVE METABOLIC PANEL
ALBUMIN: 3.6 g/dL (ref 3.5–5.0)
ALT: 15 U/L (ref 14–54)
AST: 32 U/L (ref 15–41)
Alkaline Phosphatase: 115 U/L (ref 38–126)
Anion gap: 12 (ref 5–15)
BUN: 12 mg/dL (ref 6–20)
CHLORIDE: 108 mmol/L (ref 101–111)
CO2: 21 mmol/L — AB (ref 22–32)
CREATININE: 1.04 mg/dL — AB (ref 0.44–1.00)
Calcium: 9.3 mg/dL (ref 8.9–10.3)
GFR calc non Af Amer: >60 mL/min (ref >60)
GLUCOSE: 74 mg/dL (ref 65–99)
Potassium: 4.5 mmol/L (ref 3.5–5.1)
SODIUM: 141 mmol/L (ref 135–145)
Total Bilirubin: 0.4 mg/dL (ref 0.3–1.2)
Total Protein: 7.2 g/dL (ref 6.5–8.1)

## 2015-09-12 LAB — CBC
HCT: 39.5 % (ref 36.0–46.0)
Hemoglobin: 13.2 g/dL (ref 12.0–15.0)
MCH: 34.5 pg — ABNORMAL HIGH (ref 26.0–34.0)
MCHC: 33.4 g/dL (ref 30.0–36.0)
MCV: 103.1 fL — ABNORMAL HIGH (ref 78.0–100.0)
PLATELETS: 444 10*3/uL — AB (ref 150–400)
RBC: 3.83 MIL/uL — AB (ref 3.87–5.11)
RDW: 14.4 % (ref 11.5–15.5)
WBC: 7.1 10*3/uL (ref 4.0–10.5)

## 2015-09-12 LAB — I-STAT TROPONIN, ED: Troponin i, poc: 0.01 ng/mL (ref 0.00–0.08)

## 2015-09-12 LAB — PROTIME-INR
INR: 2.23 — ABNORMAL HIGH (ref 0.00–1.49)
Prothrombin Time: 24.5 seconds — ABNORMAL HIGH (ref 11.6–15.2)

## 2015-09-12 LAB — CBG MONITORING, ED: GLUCOSE-CAPILLARY: 73 mg/dL (ref 65–99)

## 2015-09-12 MED ORDER — FAMOTIDINE IN NACL 20-0.9 MG/50ML-% IV SOLN: 20.0000 mg | Freq: Once | INTRAVENOUS | Status: DC

## 2015-09-12 MED ORDER — PANTOPRAZOLE SODIUM 40 MG IV SOLR
40.0000 mg | Freq: Two times a day (BID) | INTRAVENOUS | Status: DC
Start: 2015-09-13 — End: 2015-09-13

## 2015-09-12 NOTE — ED Notes (Signed)
[facility] here to transport pt to [facility].

## 2015-09-12 NOTE — ED Notes (Signed)
Pt presents d/t left arm tingling.  This happened 2 weeks ago as well.  Per EMS not neuro deficits appreciated.  Pt is A&O x 4.  Denies pain, blurred vision or diplopia.  Pt is in NAD.

## 2015-09-12 NOTE — ED Provider Notes (Signed)
CSN: [id number]     Arrival date & time 09/12/15  1945 History   First MD Initiated Contact with Patient 09/12/15 2050     Chief Complaint  Patient presents with  . Numbness    to left shoulder/arm     (Consider location/radiation/quality/duration/timing/severity/associated sxs/prior Treatment) HPI  2weeks ago left upper ext numbness, lasted 30 minutes Today at 730 felt left upper ext numnbess/tingling Denies other neurologic symptoms on initial eval Then reports did have friend help her walk due to lower ext weakness No aphasia/vertigo/other numbness/facial droop  Past Medical History  Diagnosis Date  . Asthma     "as a baby"   . Anemia   . History of blood transfusion     "14 w/1st pregnancy; 2 w/last C-section" (03/15/2013)  . GERD (gastroesophageal reflux disease)   . Hypertension     no pcp   will go to [facility]  . Severe mitral regurgitation 04/22/2013  . SVT (supraventricular tachycardia) ([place]) 03/16/2013  . Acute diastolic heart failure ([place]) 03/15/2013  . 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 Tricuspid regurgitation   . S/P mitral valve replacement with metallic valve [id number]    80mm Sorin Carbomedics mechanical prosthesis via right mini thoracotomy approach  . 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    Complex valvuloplasty including Cor-matrix ECM patch augmentation of anterior and lateral leaflets with 52mm Edwards mc3 ring annuloplasty via right mini-thoracotomy approach  . Complete heart block ([place]) 05/28/2013    Post-op  . Epigastric hernia 200's  . Hx of echocardiogram     Echo 4/16:  Mild focal basal septal hypertrophy, EF 60-65%, no RWMA, Mechanical MVR ok with mild central regurgitation and no perivalvular leak, small mobile density attached to valvular ring (1.5x1 cm) - post surgical changes vs SBE, mild LAE   Past Surgical History  Procedure Laterality Date  . Appendectomy  ~1978  . Cesarean section  1983; 1999  . Laparoscopic  cholecystectomy  2003  . Cesarean section with bilateral tubal ligation  1999  . Tee without cardioversion N/A 03/18/2013    Procedure: TRANSESOPHAGEAL ECHOCARDIOGRAM (TEE);  Surgeon: Larey Dresser, MD;  Location: [place];  Service: Cardiovascular;  Laterality: N/A;  . Cardiac catheterization    . Multiple extractions with alveoloplasty N/A 04/04/2013    Procedure: Extraction of tooth #'s 1,8,9,13,14,15,23,24,25,26 with alveoloplasty and gross debridement of remaining teeth;  Surgeon: Lenn Cal, DDS;  Location: [place];  Service: Oral Surgery;  Laterality: N/A;  . Minimally invasive tricuspid valve repair Right 05/23/2013    Procedure: MINIMALLY INVASIVE TRICUSPID VALVE REPAIR;  Surgeon: Rexene Alberts, MD;  Location: [place];  Service: Open Heart Surgery;  Laterality: Right;  . Intraoperative transesophageal echocardiogram N/A 05/23/2013    Procedure: INTRAOPERATIVE TRANSESOPHAGEAL ECHOCARDIOGRAM;  Surgeon: Rexene Alberts, MD;  Location: [place];  Service: Open Heart Surgery;  Laterality: N/A;  . Femoral hernia repair Right 05/23/2013    Procedure: HERNIA REPAIR FEMORAL;  Surgeon: Rexene Alberts, MD;  Location: [place];  Service: Open Heart Surgery;  Laterality: Right;  . Mitral valve replacement N/A 05/23/2013    Procedure: MITRAL VALVE (MV) REPLACEMENT;  Surgeon: Rexene Alberts, MD;  Location: [place];  Service: Open Heart Surgery;  Laterality: N/A;  . Tee without cardioversion N/A 06/17/2013    Procedure: TRANSESOPHAGEAL ECHOCARDIOGRAM (TEE);  Surgeon: Lelon Perla, MD;  Location: [facility] ENDOSCOPY;  Service: Cardiovascular;  Laterality: N/A;  . Left and right heart catheterization with coronary angiogram N/A 03/22/2013  Procedure: LEFT AND RIGHT HEART CATHETERIZATION WITH CORONARY ANGIOGRAM;  Surgeon: Burnell Blanks, MD;  Location: [facility] CATH LAB;  Service: Cardiovascular;  Laterality: N/A;   Family History  Problem Relation Age of Onset  . Sarcoidosis Sister   . Stroke Father   .  Heart attack Neg Hx   . Cancer Mother    Social History  Substance Use Topics  . Smoking status: Former Smoker -- 0.50 packs/day for 36 years    Types: Cigarettes    Quit date: 03/15/2013  . Smokeless tobacco: Never Used  . Alcohol Use: 3.6 oz/week    6 Glasses of wine per week     Comment: 03/15/2013 "bottle of wine/wk"   OB History    No data available     Review of Systems  Constitutional: Negative for fever.  HENT: Negative for sore throat.   Eyes: Negative for visual disturbance.  Respiratory: Negative for cough and shortness of breath.   Cardiovascular: Negative for chest pain.  Gastrointestinal: Negative for nausea, vomiting and abdominal pain.  Genitourinary: Negative for difficulty urinating.  Musculoskeletal: Negative for back pain and neck pain.  Skin: Negative for rash.  Neurological: Positive for weakness and numbness. Negative for dizziness, syncope, facial asymmetry, speech difficulty and headaches.      Allergies  Oxycodone  Home Medications   Prior to Admission medications   Medication Sig Start Date End Date Taking? Authorizing Provider  acetaminophen (TYLENOL) 500 MG tablet Take 500 mg by mouth every 6 (six) hours as needed for mild pain or moderate pain.   Yes Historical Provider, MD  aspirin EC 81 MG tablet Take 1 tablet (81 mg total) by mouth daily. 08/26/13  Yes Larey Dresser, MD  warfarin (COUMADIN) 5 MG tablet Take as directed by coumadin clinic Patient taking differently: Take 5 mg by mouth daily at 6 PM. Take 1 1/2 tablets (7.5 mg) by mouth on Monday, Wednesday and Friday, take 1 tablet (5 mg) on Sunday, Tuesday, Thursday, Saturday 05/15/15  Yes Evans Lance, MD  [address] [facility] HAZEL EX Apply 1 application topically at bedtime as needed (knee pain).    Yes Historical Provider, MD  ranitidine (ZANTAC) 150 MG capsule Take 1 capsule (150 mg total) by mouth daily. Patient not taking: Reported on 09/12/2015 07/31/15   Milton Ferguson, MD   BP 127/82 mmHg   Pulse 94  Temp(Src) 98.3 F (36.8 C) (Oral)  Resp 17  SpO2 95%  LMP 01/28/2013 Physical Exam  Constitutional: She is oriented to person, place, and time. She appears well-developed and well-nourished. No distress.  HENT:  Head: Normocephalic and atraumatic.  Eyes: Conjunctivae and EOM are normal.  Neck: Normal range of motion.  Cardiovascular: Normal rate, regular rhythm, normal heart sounds and intact distal pulses.  Exam reveals no gallop and no friction rub.   No murmur heard. Pulmonary/Chest: Effort normal and breath sounds normal. No respiratory distress. She has no wheezes. She has no rales.  Abdominal: Soft. She exhibits no distension. There is no tenderness. There is no guarding.  Musculoskeletal: She exhibits no edema or tenderness.  Neurological: She is alert and oriented to person, place, and time. No cranial nerve deficit or sensory deficit. GCS eye subscore is 4. GCS verbal subscore is 5. GCS motor subscore is 6.  Left lower ext weakness +LLE drift strength 3/5 LLE hip flexion No UE drift  Skin: Skin is warm and dry. No rash noted. She is not diaphoretic. No erythema.  Nursing note and vitals  reviewed.   ED Course  Procedures (including critical care time) Labs Review Labs Reviewed  CBC - Abnormal; Notable for the following:    RBC 3.83 (*)    MCV 103.1 (*)    MCH 34.5 (*)    Platelets 444 (*)    All other components within normal limits  COMPREHENSIVE METABOLIC PANEL - Abnormal; Notable for the following:    CO2 21 (*)    Creatinine, Ser 1.04 (*)    All other components within normal limits  PROTIME-INR - Abnormal; Notable for the following:    Prothrombin Time 24.5 (*)    INR 2.23 (*)    All other components within normal limits  DIFFERENTIAL  CBG MONITORING, ED  I-STAT TROPOININ, ED    Imaging Review Ct Head Wo Contrast  09/12/2015  CLINICAL DATA:  Code stroke.  Left arm tingling and numbness. EXAM: CT HEAD WITHOUT CONTRAST TECHNIQUE: Contiguous axial  images were obtained from the base of the skull through the vertex without intravenous contrast. COMPARISON:  Head CT 06/04/2015 FINDINGS: No intracranial hemorrhage, mass effect, or midline shift. No hydrocephalus. The basilar cisterns are patent. No evidence of territorial infarct. No intracranial fluid collection. Calvarium is intact. Included paranasal sinuses and mastoid air cells are well aerated. IMPRESSION: No acute intracranial abnormality. These results were called by telephone at the time of interpretation on 09/12/2015 at 9:30 pm to Dr. Gareth Morgan , who verbally acknowledged these results. Electronically Signed   By: Jeb Levering M.D.   On: 09/12/2015 21:30   I have personally reviewed and evaluated these images and lab results as part of my medical decision-making.   EKG Interpretation   Date/Time:  Saturday September 12 2015 20:01:43 EST Ventricular Rate:  100 PR Interval:  166 QRS Duration: 105 QT Interval:  369 QTC Calculation: 476 R Axis:   -28 Text Interpretation:  Sinus tachycardia Borderline left axis deviation Low  voltage, precordial leads RSR' in V1 or V2, probably normal variant No  significant change since last tracing Confirmed by [facility] MD, [place]  ([id number]) on 09/12/2015 9:29:06 PM      MDM   Final diagnoses:  Left leg weakness  TIA (transient ischemic attack)  TIA (transient ischemic attack)   54yo female with history of rheumatic mitral stenosis s/p mitral valve repair, tricuspid regurgitation s/p tricuspid valve repair, on coumadin, SVT who presents with left arm tingling with last normal at 730PM.  At triage, patient had report arm tingling with triage note reporting no neuro deficits and patient was triaged at a lower level given no concern at that time for Code Stroke.  On my initial discussion denied other symptoms and reported tingling had resolved, however on exam patient was found to have left lower extremity weakness and reported she did  notice this earlier and required help ambulating from friend.  Patient with hx of coumadin use, however given pt in window of possible intervention with weakness on exam, Code Stroke was called. Spoke with Dr. Nicole Kindred of Neurology on the phone. CT showed no acute abnormality. Pt transferred to [facility] in stable condition for stroke evaluation.   Gareth Morgan, MD 09/13/15 [phone]

## 2015-09-12 NOTE — ED Provider Notes (Signed)
CSN: [id number]     Arrival date & time 09/12/15  1945 History   First MD Initiated Contact with Patient 09/12/15 2050     Chief Complaint  Patient presents with  . Numbness    to left shoulder/arm    @EDPCLEARED @ (Consider location/radiation/quality/duration/timing/severity/associated sxs/prior Treatment) HPI Comments: 54 year old female with history of mitral stenosis, mitral regurg, valve replacements, Coumadin use, endocarditis was transferred as code stroke to see neurology. Patient had left hand tingling followed by left leg numbness and weakness Anaprox 7:30. Neurology is aware and evaluated the patient on arrival. No head injury, no significant headache, no fevers or chills. Patient has been taking her Coumadin. Symptoms overall constant, mild improvement numbness.  The history is provided by the patient and medical records.    Past Medical History  Diagnosis Date  . Asthma     "as a baby"   . Anemia   . History of blood transfusion     "14 w/1st pregnancy; 2 w/last C-section" (03/15/2013)  . GERD (gastroesophageal reflux disease)   . Hypertension     no pcp   will go to [facility]  . Severe mitral regurgitation 04/22/2013  . SVT (supraventricular tachycardia) ([place]) 03/16/2013  . Acute diastolic heart failure ([place]) 03/15/2013  . 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 Tricuspid regurgitation   . S/P mitral valve replacement with metallic valve [id number]    73mm Sorin Carbomedics mechanical prosthesis via right mini thoracotomy approach  . 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    Complex valvuloplasty including Cor-matrix ECM patch augmentation of anterior and lateral leaflets with 54mm Edwards mc3 ring annuloplasty via right mini-thoracotomy approach  . Complete heart block ([place]) 05/28/2013    Post-op  . Epigastric hernia 200's  . Hx of echocardiogram     Echo 4/16:  Mild focal basal septal hypertrophy, EF 60-65%, no RWMA, Mechanical MVR ok with mild central  regurgitation and no perivalvular leak, small mobile density attached to valvular ring (1.5x1 cm) - post surgical changes vs SBE, mild LAE   Past Surgical History  Procedure Laterality Date  . Appendectomy  ~1978  . Cesarean section  1983; 1999  . Laparoscopic cholecystectomy  2003  . Cesarean section with bilateral tubal ligation  1999  . Tee without cardioversion N/A 03/18/2013    Procedure: TRANSESOPHAGEAL ECHOCARDIOGRAM (TEE);  Surgeon: Larey Dresser, MD;  Location: [place];  Service: Cardiovascular;  Laterality: N/A;  . Cardiac catheterization    . Multiple extractions with alveoloplasty N/A 04/04/2013    Procedure: Extraction of tooth #'s 1,8,9,13,14,15,23,24,25,26 with alveoloplasty and gross debridement of remaining teeth;  Surgeon: Lenn Cal, DDS;  Location: [place];  Service: Oral Surgery;  Laterality: N/A;  . Minimally invasive tricuspid valve repair Right 05/23/2013    Procedure: MINIMALLY INVASIVE TRICUSPID VALVE REPAIR;  Surgeon: Rexene Alberts, MD;  Location: [place];  Service: Open Heart Surgery;  Laterality: Right;  . Intraoperative transesophageal echocardiogram N/A 05/23/2013    Procedure: INTRAOPERATIVE TRANSESOPHAGEAL ECHOCARDIOGRAM;  Surgeon: Rexene Alberts, MD;  Location: [place];  Service: Open Heart Surgery;  Laterality: N/A;  . Femoral hernia repair Right 05/23/2013    Procedure: HERNIA REPAIR FEMORAL;  Surgeon: Rexene Alberts, MD;  Location: [place];  Service: Open Heart Surgery;  Laterality: Right;  . Mitral valve replacement N/A 05/23/2013    Procedure: MITRAL VALVE (MV) REPLACEMENT;  Surgeon: Rexene Alberts, MD;  Location: [place];  Service: Open Heart Surgery;  Laterality: N/A;  . Darden Dates  without cardioversion N/A 06/17/2013    Procedure: TRANSESOPHAGEAL ECHOCARDIOGRAM (TEE);  Surgeon: Lelon Perla, MD;  Location: [facility] ENDOSCOPY;  Service: Cardiovascular;  Laterality: N/A;  . Left and right heart catheterization with coronary angiogram N/A 03/22/2013     Procedure: LEFT AND RIGHT HEART CATHETERIZATION WITH CORONARY ANGIOGRAM;  Surgeon: Burnell Blanks, MD;  Location: [facility] CATH LAB;  Service: Cardiovascular;  Laterality: N/A;   Family History  Problem Relation Age of Onset  . Sarcoidosis Sister   . Stroke Father   . Heart attack Neg Hx   . Cancer Mother    Social History  Substance Use Topics  . Smoking status: Former Smoker -- 0.50 packs/day for 36 years    Types: Cigarettes    Quit date: 03/15/2013  . Smokeless tobacco: Never Used  . Alcohol Use: 3.6 oz/week    6 Glasses of wine per week     Comment: 03/15/2013 "bottle of wine/wk"   OB History    No data available     Review of Systems  Constitutional: Negative for fever and chills.  HENT: Negative for congestion.   Eyes: Negative for visual disturbance.  Respiratory: Negative for shortness of breath.   Cardiovascular: Negative for chest pain.  Gastrointestinal: Negative for vomiting and abdominal pain.  Genitourinary: Negative for dysuria and flank pain.  Musculoskeletal: Negative for back pain, neck pain and neck stiffness.  Skin: Negative for rash.  Neurological: Positive for weakness and numbness. Negative for light-headedness and headaches.      Allergies  Oxycodone  Home Medications   Prior to Admission medications   Medication Sig Start Date End Date Taking? Authorizing Provider  acetaminophen (TYLENOL) 500 MG tablet Take 500 mg by mouth every 6 (six) hours as needed for mild pain or moderate pain.   Yes Historical Provider, MD  aspirin EC 81 MG tablet Take 1 tablet (81 mg total) by mouth daily. 08/26/13  Yes Larey Dresser, MD  warfarin (COUMADIN) 5 MG tablet Take as directed by coumadin clinic Patient taking differently: Take 5 mg by mouth daily at 6 PM. Take 1 1/2 tablets (7.5 mg) by mouth on Monday, Wednesday and Friday, take 1 tablet (5 mg) on Sunday, Tuesday, Thursday, Saturday 05/15/15  Yes Evans Lance, MD  [facility] HAZEL EX Apply 1 application  topically at bedtime as needed (knee pain).    Yes Historical Provider, MD  ranitidine (ZANTAC) 150 MG capsule Take 1 capsule (150 mg total) by mouth daily. Patient not taking: Reported on 09/12/2015 07/31/15   Milton Ferguson, MD   BP 104/78 mmHg  Pulse 93  Temp(Src) 98.3 F (36.8 C) (Oral)  Resp 20  SpO2 98%  LMP 01/28/2013 Physical Exam  Constitutional: She is oriented to person, place, and time. She appears well-developed and well-nourished.  HENT:  Head: Normocephalic and atraumatic.  Eyes: Conjunctivae are normal. Right eye exhibits no discharge. Left eye exhibits no discharge.  Neck: Normal range of motion. Neck supple. No tracheal deviation present.  Cardiovascular: Normal rate and regular rhythm.   Pulmonary/Chest: Effort normal and breath sounds normal.  Abdominal: Soft. She exhibits no distension. There is no tenderness. There is no guarding.  Musculoskeletal: She exhibits no edema.  Neurological: She is alert and oriented to person, place, and time. No cranial nerve deficit. GCS eye subscore is 4. GCS verbal subscore is 5. GCS motor subscore is 6.  Weakness left leg flexion, left foot dorsiflexion, decr sensation left leg vs right.  5+ strength upper extremities  bilateral with flexion extension and right lower extremity.  Skin: Skin is warm. No rash noted.  Psychiatric: She has a normal mood and affect.  Nursing note and vitals reviewed.   ED Course  Procedures (including critical care time) Labs Review Labs Reviewed  CBC - Abnormal; Notable for the following:    RBC 3.83 (*)    MCV 103.1 (*)    MCH 34.5 (*)    Platelets 444 (*)    All other components within normal limits  COMPREHENSIVE METABOLIC PANEL - Abnormal; Notable for the following:    CO2 21 (*)    Creatinine, Ser 1.04 (*)    All other components within normal limits  PROTIME-INR - Abnormal; Notable for the following:    Prothrombin Time 24.5 (*)    INR 2.23 (*)    All other components within normal  limits  DIFFERENTIAL  CBG MONITORING, ED  I-STAT TROPOININ, ED    Imaging Review Ct Head Wo Contrast  09/12/2015  CLINICAL DATA:  Code stroke.  Left arm tingling and numbness. EXAM: CT HEAD WITHOUT CONTRAST TECHNIQUE: Contiguous axial images were obtained from the base of the skull through the vertex without intravenous contrast. COMPARISON:  Head CT 06/04/2015 FINDINGS: No intracranial hemorrhage, mass effect, or midline shift. No hydrocephalus. The basilar cisterns are patent. No evidence of territorial infarct. No intracranial fluid collection. Calvarium is intact. Included paranasal sinuses and mastoid air cells are well aerated. IMPRESSION: No acute intracranial abnormality. These results were called by telephone at the time of interpretation on 09/12/2015 at 9:30 pm to Dr. Gareth Morgan , who verbally acknowledged these results. Electronically Signed   By: Jeb Levering M.D.   On: 09/12/2015 21:30   I have personally reviewed and evaluated these images and lab results as part of my medical decision-making.   EKG Interpretation   Date/Time:  Saturday September 12 2015 20:01:43 EST Ventricular Rate:  100 PR Interval:  166 QRS Duration: 105 QT Interval:  369 QTC Calculation: 476 R Axis:   -28 Text Interpretation:  Sinus tachycardia Borderline left axis deviation Low  voltage, precordial leads RSR' in V1 or V2, probably normal variant No  significant change since last tracing Confirmed by [place]  ([id number]) on 09/12/2015 9:29:06 PM      MDM   Final diagnoses:  Left leg weakness   Patient presents after transfer from [facility] for code stroke. Neurology evaluated on arrival. With patient being on Coumadin she's not a candidate for TPA. Persistent left leg signs and symptoms. Plan for admission  Neurology recommended [facility] hospital[facility] admit. The patients results and plan were reviewed and discussed.   Any x-rays performed were independently reviewed by  myself.   Differential diagnosis were considered with the presenting HPI.  Medications - No data to display  Filed Vitals:   09/12/15 2215 09/12/15 2230 09/12/15 2245 09/12/15 2300  BP: 140/85 139/79 138/86 104/78  Pulse: 93 91 94 93  Temp:      TempSrc:      Resp: 13 24 22 20   SpO2: 100% 100% 98% 98%    Final diagnoses:  Left leg weakness    Admission/ observation were discussed with the admitting physician, patient and/or family and they are comfortable with the plan.     Elnora Morrison, MD 09/12/15 [phone]

## 2015-09-12 NOTE — Consult Note (Signed)
Admission H&P    Chief Complaint: Numbness and weakness of left side.  HPI: Mary [name] is an 54 y.o. female history of hypertension, rheumatic heart disease, atrophic valve replacement, tricuspid repair and complete heart block, presenting with new onset numbness involving left upper extremity as well as weakness involving left lower extremity. Patient has similar episode of transient left upper extremity numbness 2 weeks ago. Patient is on anticoagulation with Coumadin and INR tonight was 2.23. She also takes aspirin 81 mg per day. Right stroke score was 3. She was last known well at 9:30 PM tonight.  LSN: 9:30 PM on 09/12/2015 tPA Given: No: On Coumadin with INR 2.23 mRankin:  Past Medical History  Diagnosis Date  . Asthma     "as a baby"   . Anemia   . History of blood transfusion     "14 w/1st pregnancy; 2 w/last C-section" (03/15/2013)  . GERD (gastroesophageal reflux disease)   . Hypertension     no pcp   will go to [facility]  . Severe mitral regurgitation 04/22/2013  . SVT (supraventricular tachycardia) ([place]) 03/16/2013  . Acute diastolic heart failure ([place]) 03/15/2013  . 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 Tricuspid regurgitation   . S/P mitral valve replacement with metallic valve 1/60/1093    15m Sorin Carbomedics mechanical prosthesis via right mini thoracotomy approach  . 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    Complex valvuloplasty including Cor-matrix ECM patch augmentation of anterior and lateral leaflets with 256mEdwards mc3 ring annuloplasty via right mini-thoracotomy approach  . [place]    Post-op  . Epigastric hernia 200's  . Hx of echocardiogram     Echo 4/16:  Mild focal basal septal hypertrophy, EF 60-65%, no RWMA, Mechanical MVR ok with mild central regurgitation and no perivalvular leak, small mobile density attached to valvular ring (1.5x1 cm) - post surgical changes vs SBE, mild LAE    Past Surgical History   Procedure Laterality Date  . Appendectomy  ~1978  . Cesarean section  1983; 1999  . Laparoscopic cholecystectomy  2003  . Cesarean section with bilateral tubal ligation  1999  . Tee without cardioversion N/A 03/18/2013    Procedure: TRANSESOPHAGEAL ECHOCARDIOGRAM (TEE);  Surgeon: DaLarey DresserMD;  [place] Service: Cardiovascular;  Laterality: N/A;  . Cardiac catheterization    . Multiple extractions with alveoloplasty N/A 04/04/2013    Procedure: Extraction of tooth #'s 1,8,9,13,14,15,23,24,25,26 with alveoloplasty and gross debridement of remaining teeth;  Surgeon: RoLenn CalDDS;  [place] Service: Oral Surgery;  Laterality: N/A;  . Minimally invasive tricuspid valve repair Right 05/23/2013    Procedure: MINIMALLY INVASIVE TRICUSPID VALVE REPAIR;  Surgeon: ClRexene AlbertsMD;  [place] Service: Open Heart Surgery;  Laterality: Right;  . Intraoperative transesophageal echocardiogram N/A 05/23/2013    Procedure: INTRAOPERATIVE TRANSESOPHAGEAL ECHOCARDIOGRAM;  Surgeon: ClRexene AlbertsMD;  [place] Service: Open Heart Surgery;  Laterality: N/A;  . Femoral hernia repair Right 05/23/2013    Procedure: HERNIA REPAIR FEMORAL;  Surgeon: ClRexene AlbertsMD;  [place] Service: Open Heart Surgery;  Laterality: Right;  . Mitral valve replacement N/A 05/23/2013    Procedure: MITRAL VALVE (MV) REPLACEMENT;  Surgeon: ClRexene AlbertsMD;  [place] Service: Open Heart Surgery;  Laterality: N/A;  . Tee without cardioversion N/A 06/17/2013    Procedure: TRANSESOPHAGEAL ECHOCARDIOGRAM (TEE);  Surgeon: BrLelon Perla  MD;  Location: [place] ENDOSCOPY;  Service: Cardiovascular;  Laterality: N/A;  . Left and right heart catheterization with coronary angiogram N/A 03/22/2013    Procedure: LEFT AND RIGHT HEART CATHETERIZATION WITH CORONARY ANGIOGRAM;  Surgeon: Burnell Blanks, MD;  Location: [facility] CATH LAB;  Service: Cardiovascular;  Laterality:  N/A;    Family History  Problem Relation Age of Onset  . Sarcoidosis Sister   . Stroke Father   . Heart attack Neg Hx   . Cancer Mother    Social History:  reports that she quit smoking about 2 years ago. Her smoking use included Cigarettes. She has a 18 pack-year smoking history. She has never used smokeless tobacco. She reports that she drinks about 3.6 oz of alcohol per week. She reports that she does not use illicit drugs.  Allergies:  Allergies  Allergen Reactions  . Oxycodone Itching   Medications: Asians preadmission medications were reviewed by me.  ROS: History obtained from the patient  General ROS: negative for - chills, fatigue, fever, night sweats, weight gain or weight loss Psychological ROS: negative for - behavioral disorder, hallucinations, memory difficulties, mood swings or suicidal ideation Ophthalmic ROS: negative for - blurry vision, double vision, eye pain or loss of vision ENT ROS: negative for - epistaxis, nasal discharge, oral lesions, sore throat, tinnitus or vertigo Allergy and Immunology ROS: negative for - hives or itchy/watery eyes Hematological and Lymphatic ROS: negative for - bleeding problems, bruising or swollen lymph nodes Endocrine ROS: negative for - galactorrhea, hair pattern changes, polydipsia/polyuria or temperature intolerance Respiratory ROS: negative for - cough, hemoptysis, shortness of breath or wheezing Cardiovascular ROS: negative for - chest pain, dyspnea on exertion, edema or irregular heartbeat Gastrointestinal ROS: negative for - abdominal pain, diarrhea, hematemesis, nausea/vomiting or stool incontinence Genito-Urinary ROS: negative for - dysuria, hematuria, incontinence or urinary frequency/urgency Musculoskeletal ROS: negative for - joint swelling or muscular weakness Neurological ROS: as noted in HPI Dermatological ROS: negative for rash and skin lesion changes  Physical Examination: Blood pressure 137/90, pulse 95,  temperature 98.3 F (36.8 C), temperature source Oral, resp. rate 18, last menstrual period 01/28/2013, SpO2 100 %.  HEENT-  Normocephalic, no lesions, without obvious abnormality.  Normal external eye and conjunctiva.  Normal TM's bilaterally.  Normal auditory canals and external ears. Normal external nose, mucus membranes and septum.  Normal pharynx. Neck supple with no masses, nodes, nodules or enlargement. Cardiovascular - regular rate and rhythm, S1, S2 normal, no murmur, click, rub or gallop Lungs - chest clear, no wheezing, rales, normal symmetric air entry Abdomen - soft, non-tender; bowel sounds normal; no masses,  no organomegaly Extremities - no joint deformities, effusion, or inflammation and no edema  Neurologic Examination: Mental Status: Alert, oriented, thought content appropriate.  Speech fluent without evidence of aphasia. Able to follow commands without difficulty. Cranial Nerves: II-Visual fields were normal. III/IV/VI-Pupils were equal and reacted normally to light. Extraocular movements were full and conjugate.    V/VII-no facial numbness and no facial weakness. VIII-normal. X-normal speech and symmetrical palatal movement. XI: trapezius strength/neck flexion strength normal bilaterally XII-midline tongue extension with normal strength. Motor: Drift of left lower extremity with moderate proximal weakness; motor exam otherwise unremarkable. Sensory: Reduced perception of tactile sensation over left extremities compared to the right as well as reduced perception of vibration distilling and left lower extremity compared to left. Deep Tendon Reflexes: 2+ and symmetric. Plantars: Flexor bilaterally Cerebellar: Normal finger-to-nose testing slightly impaired involving left upper extremity. Carotid auscultation: Normal  Results for orders placed or performed during the hospital encounter of 09/12/15 (from the past 48 hour(s))  CBG monitoring, ED     Status: None    Collection Time: 09/12/15  8:10 PM  Result Value Ref Range   Glucose-Capillary 73 65 - 99 mg/dL  CBC     Status: Abnormal   Collection Time: 09/12/15  8:10 PM  Result Value Ref Range   WBC 7.1 4.0 - 10.5 K/uL   RBC 3.83 (L) 3.87 - 5.11 MIL/uL   Hemoglobin 13.2 12.0 - 15.0 g/dL   HCT 13.6 25.2 - 26.1 %   MCV 103.1 (H) 78.0 - 100.0 fL   MCH 34.5 (H) 26.0 - 34.0 pg   MCHC 33.4 30.0 - 36.0 g/dL   RDW 67.4 62.8 - 28.6 %   Platelets 444 (H) 150 - 400 K/uL  Differential     Status: None   Collection Time: 09/12/15  8:10 PM  Result Value Ref Range   Neutrophils Relative % 69 %   Neutro Abs 4.9 1.7 - 7.7 K/uL   Lymphocytes Relative 19 %   Lymphs Abs 1.4 0.7 - 4.0 K/uL   Monocytes Relative 6 %   Monocytes Absolute 0.4 0.1 - 1.0 K/uL   Eosinophils Relative 5 %   Eosinophils Absolute 0.3 0.0 - 0.7 K/uL   Basophils Relative 1 %   Basophils Absolute 0.1 0.0 - 0.1 K/uL  Comprehensive metabolic panel     Status: Abnormal   Collection Time: 09/12/15  8:10 PM  Result Value Ref Range   Sodium 141 135 - 145 mmol/L   Potassium 4.5 3.5 - 5.1 mmol/L   Chloride 108 101 - 111 mmol/L   CO2 21 (L) 22 - 32 mmol/L   Glucose, Bld 74 65 - 99 mg/dL   BUN 12 6 - 20 mg/dL   Creatinine, Ser 6.89 (H) 0.44 - 1.00 mg/dL   Calcium 9.3 8.9 - 37.5 mg/dL   Total Protein 7.2 6.5 - 8.1 g/dL   Albumin 3.6 3.5 - 5.0 g/dL   AST 32 15 - 41 U/L   ALT 15 14 - 54 U/L   Alkaline Phosphatase 115 38 - 126 U/L   Total Bilirubin 0.4 0.3 - 1.2 mg/dL   GFR calc non Af Amer >60 >60 mL/min   GFR calc Af Amer >60 >60 mL/min    Comment: (NOTE) The eGFR has been calculated using the CKD EPI equation. This calculation has not been validated in all clinical situations. eGFR's persistently <60 mL/min signify possible Chronic Kidney Disease.    Anion gap 12 5 - 15  Protime-INR     Status: Abnormal   Collection Time: 09/12/15  8:10 PM  Result Value Ref Range   Prothrombin Time 24.5 (H) 11.6 - 15.2 seconds   INR 2.23 (H) 0.00  - 1.49  I-stat troponin, ED (not at [facility], [facility])     Status: None   Collection Time: 09/12/15  8:15 PM  Result Value Ref Range   Troponin i, poc 0.01 0.00 - 0.08 ng/mL   Comment 3            Comment: Due to the release kinetics of cTnI, a negative result within the first hours of the onset of symptoms does not rule out myocardial infarction with certainty. If myocardial infarction is still suspected, repeat the test at appropriate intervals.    Ct Head Wo Contrast  09/12/2015  CLINICAL DATA:  Code stroke.  Left arm tingling and numbness.  EXAM: CT HEAD WITHOUT CONTRAST TECHNIQUE: Contiguous axial images were obtained from the base of the skull through the vertex without intravenous contrast. COMPARISON:  Head CT 06/04/2015 FINDINGS: No intracranial hemorrhage, mass effect, or midline shift. No hydrocephalus. The basilar cisterns are patent. No evidence of territorial infarct. No intracranial fluid collection. Calvarium is intact. Included paranasal sinuses and mastoid air cells are well aerated. IMPRESSION: No acute intracranial abnormality. These results were called by telephone at the time of interpretation on 09/12/2015 at 9:30 pm to Dr. Gareth Morgan , who verbally acknowledged these results. Electronically Signed   By: Jeb Levering M.D.   On: 09/12/2015 21:30    Assessment: 54 y.o. female with multiple risk factors for stroke presenting with probable acute right subcortical small vessel ischemic stroke.  Stroke Risk Factors - family history, hypertension and valvular heart disease  Plan: 1. HgbA1c, fasting lipid panel 2. MRI, MRA  of the brain without contrast 3. PT consult, OT consult, Speech consult 4. Echocardiogram 5. Carotid dopplers 6. Prophylactic therapy- Coumadin and antiplatelet therapy with aspirin 81 mg per day 7. Risk factor modification 8. Telemetry monitoring  C.R. Nicole Kindred, MD  [facility] [phone]  09/12/2015, 10:20 PM

## 2015-09-13 ENCOUNTER — Observation Stay (HOSPITAL_COMMUNITY): Payer: Medicaid Other

## 2015-09-13 ENCOUNTER — Encounter (HOSPITAL_COMMUNITY): Payer: Self-pay | Admitting: Internal Medicine

## 2015-09-13 ENCOUNTER — Encounter (HOSPITAL_COMMUNITY): Payer: Self-pay

## 2015-09-13 DIAGNOSIS — Z8679 Personal history of other diseases of the circulatory system: Secondary | ICD-10-CM

## 2015-09-13 DIAGNOSIS — G459 Transient cerebral ischemic attack, unspecified: Secondary | ICD-10-CM | POA: Diagnosis present

## 2015-09-13 LAB — LIPID PANEL
CHOL/HDL RATIO: 3 ratio
CHOLESTEROL: 229 mg/dL — AB (ref 0–200)
HDL: 77 mg/dL (ref >40)
LDL Cholesterol: 136 mg/dL — ABNORMAL HIGH (ref 0–99)
Triglycerides: 78 mg/dL (ref <150)
VLDL: 16 mg/dL (ref 0–40)

## 2015-09-13 LAB — CBC
HCT: 33.6 % — ABNORMAL LOW (ref 36.0–46.0)
HEMOGLOBIN: 11.2 g/dL — AB (ref 12.0–15.0)
MCH: 34.4 pg — ABNORMAL HIGH (ref 26.0–34.0)
MCHC: 33.3 g/dL (ref 30.0–36.0)
MCV: 103.1 fL — ABNORMAL HIGH (ref 78.0–100.0)
PLATELETS: 368 10*3/uL (ref 150–400)
RBC: 3.26 MIL/uL — ABNORMAL LOW (ref 3.87–5.11)
RDW: 14.5 % (ref 11.5–15.5)
WBC: 6.1 10*3/uL (ref 4.0–10.5)

## 2015-09-13 LAB — COMPREHENSIVE METABOLIC PANEL
ALBUMIN: 2.7 g/dL — AB (ref 3.5–5.0)
ALK PHOS: 100 U/L (ref 38–126)
ALT: 14 U/L (ref 14–54)
ANION GAP: 7 (ref 5–15)
AST: 29 U/L (ref 15–41)
BILIRUBIN TOTAL: 0.6 mg/dL (ref 0.3–1.2)
BUN: 9 mg/dL (ref 6–20)
CALCIUM: 8.7 mg/dL — AB (ref 8.9–10.3)
CO2: 22 mmol/L (ref 22–32)
CREATININE: 0.94 mg/dL (ref 0.44–1.00)
Chloride: 109 mmol/L (ref 101–111)
GFR calc Af Amer: >60 mL/min (ref >60)
GFR calc non Af Amer: >60 mL/min (ref >60)
GLUCOSE: 88 mg/dL (ref 65–99)
Potassium: 4 mmol/L (ref 3.5–5.1)
Sodium: 138 mmol/L (ref 135–145)
TOTAL PROTEIN: 5.9 g/dL — AB (ref 6.5–8.1)

## 2015-09-13 LAB — PROTIME-INR
INR: 2.06 — AB (ref 0.00–1.49)
Prothrombin Time: 23.1 seconds — ABNORMAL HIGH (ref 11.6–15.2)

## 2015-09-13 LAB — PHOSPHORUS: PHOSPHORUS: 3.8 mg/dL (ref 2.5–4.6)

## 2015-09-13 LAB — MAGNESIUM: MAGNESIUM: 1.6 mg/dL — AB (ref 1.7–2.4)

## 2015-09-13 MED ORDER — STROKE: EARLY STAGES OF RECOVERY BOOK
Freq: Once | Status: DC
Start: 2015-09-13 — End: 2015-09-14

## 2015-09-13 MED ORDER — SODIUM CHLORIDE 0.9 % IJ SOLN
3.0000 mL | Freq: Two times a day (BID) | INTRAMUSCULAR | Status: DC
  Administered 2015-09-13: 3 mL via INTRAVENOUS

## 2015-09-13 MED ORDER — WARFARIN - PHARMACIST DOSING INPATIENT: Freq: Every day | Status: DC

## 2015-09-13 MED ORDER — ASPIRIN EC 81 MG PO TBEC
81.0000 mg | DELAYED_RELEASE_TABLET | Freq: Every day | ORAL | Status: DC
  Administered 2015-09-13: 81 mg via ORAL
  Filled 2015-09-13: qty 1

## 2015-09-13 MED ORDER — WARFARIN SODIUM 5 MG PO TABS
10.0000 mg | ORAL_TABLET | Freq: Once | ORAL | Status: AC
  Administered 2015-09-13: 5 mg via ORAL
  Filled 2015-09-13 (×2): qty 2

## 2015-09-13 MED ORDER — ATORVASTATIN CALCIUM 40 MG PO TABS
40.0000 mg | ORAL_TABLET | Freq: Every day | ORAL | Status: DC
  Administered 2015-09-13: 40 mg via ORAL
  Filled 2015-09-13: qty 1

## 2015-09-13 NOTE — Progress Notes (Signed)
Patient seen and examined this morning, admitted overnight with right sided weakness and numbness. MRI negative, 2D echo and carotids pending. Neuro following. Her symptoms resolved and can go home later today if echo and carotids are done  [place] Urieta M. Cruzita Lederer, MD [facility] [phone]

## 2015-09-13 NOTE — H&P (Signed)
[facility] History and Physical  SHAYONA LAVOIE [id number] DOB: [birth year] DOA: 09/12/2015  Referring physician: Elnora Morrison, MD PCP: Loralie Champagne, MD   Chief Complaint:  Left-sided numbness and weakness.  HPI: Mary [name] is a 54 y.o. female with a past medical history of  Rheumatic heart disease,  On chronic anticoagulation with warfarin for S/P mitral valve replacement with mechanical valve, S/P tricuspid valve repair, GERD, SVT,  Hypertension who comes to the emergency department due to left-sided extremities, upper > lower numbness and weakness since earlier this evening. She had a similar episode about 2 weeks ago that resolved spontaneously. She denies headache, vision changes, language or speech disturbances.    When seen in the emergency department, the patient was in no acute distress. She is stated that her symptoms were almost resolved.   Review of Systems:  Constitutional:  No weight loss, night sweats, Fevers, chills, fatigue.  HEENT:  No headaches, Difficulty swallowing,Tooth/dental problems,Sore throat,  No sneezing, itching, ear ache, nasal congestion, post nasal drip,  Cardio-vascular:  No chest pain, Orthopnea, PND, swelling in lower extremities, anasarca, dizziness, palpitations  GI:  No heartburn, indigestion, abdominal pain, nausea, vomiting, diarrhea, change in bowel habits, loss of appetite  Resp:   positive mild nonproductive cough since last month after the patient had  URI symptoms,  Which  Have since then resolved.  denies dyspnea, wheezing or hemoptysis. Skin:  no rash or lesions.  GU:  no dysuria, change in color of urine, no urgency or frequency. No flank pain.  Musculoskeletal:  No joint pain or swelling. No decreased range of motion. No back pain.  Psych:  No change in mood or affect. No depression or anxiety. No memory loss.   neuro:  As above   Past Medical History  Diagnosis Date  . Asthma     "as a baby"   . Anemia    . History of blood transfusion     "14 w/1st pregnancy; 2 w/last C-section" (03/15/2013)  . GERD (gastroesophageal reflux disease)   . Hypertension     no pcp   will go to [facility]  . Severe mitral regurgitation 04/22/2013  . SVT (supraventricular tachycardia) ([place]) 03/16/2013  . Acute diastolic heart failure ([place]) 03/15/2013  . 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 Tricuspid regurgitation   . S/P mitral valve replacement with metallic valve 123XX123    64mm Sorin Carbomedics mechanical prosthesis via right mini thoracotomy approach  . 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    Complex valvuloplasty including Cor-matrix ECM patch augmentation of anterior and lateral leaflets with 67mm Edwards mc3 ring annuloplasty via right mini-thoracotomy approach  . Complete heart block ([place]) 05/28/2013    Post-op  . Epigastric hernia 200's  . Hx of echocardiogram     Echo 4/16:  Mild focal basal septal hypertrophy, EF 60-65%, no RWMA, Mechanical MVR ok with mild central regurgitation and no perivalvular leak, small mobile density attached to valvular ring (1.5x1 cm) - post surgical changes vs SBE, mild LAE   Past Surgical History  Procedure Laterality Date  . Appendectomy  ~1978  . Cesarean section  1983; 1999  . Laparoscopic cholecystectomy  2003  . Cesarean section with bilateral tubal ligation  1999  . Tee without cardioversion N/A 03/18/2013    Procedure: TRANSESOPHAGEAL ECHOCARDIOGRAM (TEE);  Surgeon: Larey Dresser, MD;  Location: [place];  Service: Cardiovascular;  Laterality: N/A;  . Cardiac catheterization    . Multiple extractions with alveoloplasty N/A  04/04/2013    Procedure: Extraction of tooth #'s 1,8,9,13,14,15,23,24,25,26 with alveoloplasty and gross debridement of remaining teeth;  Surgeon: Lenn Cal, DDS;  Location: [place];  Service: Oral Surgery;  Laterality: N/A;  . Minimally invasive tricuspid valve repair Right 05/23/2013    Procedure: MINIMALLY INVASIVE  TRICUSPID VALVE REPAIR;  Surgeon: Rexene Alberts, MD;  Location: [place];  Service: Open Heart Surgery;  Laterality: Right;  . Intraoperative transesophageal echocardiogram N/A 05/23/2013    Procedure: INTRAOPERATIVE TRANSESOPHAGEAL ECHOCARDIOGRAM;  Surgeon: Rexene Alberts, MD;  Location: [place];  Service: Open Heart Surgery;  Laterality: N/A;  . Femoral hernia repair Right 05/23/2013    Procedure: HERNIA REPAIR FEMORAL;  Surgeon: Rexene Alberts, MD;  Location: [place];  Service: Open Heart Surgery;  Laterality: Right;  . Mitral valve replacement N/A 05/23/2013    Procedure: MITRAL VALVE (MV) REPLACEMENT;  Surgeon: Rexene Alberts, MD;  Location: [place];  Service: Open Heart Surgery;  Laterality: N/A;  . Tee without cardioversion N/A 06/17/2013    Procedure: TRANSESOPHAGEAL ECHOCARDIOGRAM (TEE);  Surgeon: Lelon Perla, MD;  [facility] ENDOSCOPY;  Service: Cardiovascular;  Laterality: N/A;  . Left and right heart catheterization with coronary angiogram N/A 03/22/2013    Procedure: LEFT AND RIGHT HEART CATHETERIZATION WITH CORONARY ANGIOGRAM;  Surgeon: Burnell Blanks, MD;  Location: [facility] CATH LAB;  Service: Cardiovascular;  Laterality: N/A;   Social History:  reports that she quit smoking about 2 years ago. Her smoking use included Cigarettes. She has a 18 pack-year smoking history. She has never used smokeless tobacco. She reports that she drinks about 3.6 oz of alcohol per week. She reports that she does not use illicit drugs.  Allergies  Allergen Reactions  . Oxycodone Itching    Family History  Problem Relation Age of Onset  . Sarcoidosis Sister   . Stroke Father   . Heart attack Neg Hx   . Cancer Mother     Prior to Admission medications   Medication Sig Start Date End Date Taking? Authorizing Provider  acetaminophen (TYLENOL) 500 MG tablet Take 500 mg by mouth every 6 (six) hours as needed for mild pain or moderate pain.   Yes Historical Provider, MD  aspirin EC 81 MG tablet  Take 1 tablet (81 mg total) by mouth daily. 08/26/13  Yes Larey Dresser, MD  warfarin (COUMADIN) 5 MG tablet Take as directed by coumadin clinic Patient taking differently: Take 5 mg by mouth daily at 6 PM. Take 1 1/2 tablets (7.5 mg) by mouth on Monday, Wednesday and Friday, take 1 tablet (5 mg) on Sunday, Tuesday, Thursday, Saturday 05/15/15  Yes Evans Lance, MD  [facility] HAZEL EX Apply 1 application topically at bedtime as needed (knee pain).    Yes Historical Provider, MD  ranitidine (ZANTAC) 150 MG capsule Take 1 capsule (150 mg total) by mouth daily. Patient not taking: Reported on 09/12/2015 07/31/15   Milton Ferguson, MD   Physical Exam: Filed Vitals:   09/13/15 0100 09/13/15 0115 09/13/15 0200 09/13/15 0215  BP: 114/81 124/76 127/82   Pulse: 93 94 93 94  Temp:      TempSrc:      Resp: 15 16  17   SpO2: 98% 97% 97% 95%    Wt Readings from Last 3 Encounters:  07/31/15 62.596 kg (138 lb)  06/04/15 61.236 kg (135 lb)  04/10/15 61.29 kg (135 lb 1.9 oz)    General:  Appears calm and comfortable Eyes: PERRL, normal  lids, irises & conjunctiva ENT: grossly normal hearing, lips & tongue Neck: no LAD, masses or thyromegaly Cardiovascular: RRR,  Positive  4/6 systolic murmur. No LE edema. Telemetry: SR, no arrhythmias  Respiratory: CTA bilaterally, no w/r/r. Normal respiratory effort. Abdomen: soft, ntnd Skin: no rash or induration seen on limited exam Musculoskeletal: grossly normal tone BUE/BLE Psychiatric: grossly normal mood and affect, speech fluent and appropriate Neurologic:  Awake alert oriented 3, mild motor weakness 4.5/5 and reduced sensory perception on left extremities          Labs on Admission:  Basic Metabolic Panel:  Recent Labs Lab 09/12/15 2010  NA 141  K 4.5  CL 108  CO2 21*  GLUCOSE 74  BUN 12  CREATININE 1.04*  CALCIUM 9.3   Liver Function Tests:  Recent Labs Lab 09/12/15 2010  AST 32  ALT 15  ALKPHOS 115  BILITOT 0.4  PROT 7.2  ALBUMIN  3.6   CBC:  Recent Labs Lab 09/12/15 2010  WBC 7.1  NEUTROABS 4.9  HGB 13.2  HCT 39.5  MCV 103.1*  PLT 444*    BNP (last 3 results)  Recent Labs  11/25/14 1100  BNP 36.5    ProBNP (last 3 results)  Recent Labs  12/29/14 0945  PROBNP 22.0    CBG:  Recent Labs Lab 09/12/15 2010  GLUCAP 73    Radiological Exams on Admission: Ct Head Wo Contrast  09/12/2015  CLINICAL DATA:  Code stroke.  Left arm tingling and numbness. EXAM: CT HEAD WITHOUT CONTRAST TECHNIQUE: Contiguous axial images were obtained from the base of the skull through the vertex without intravenous contrast. COMPARISON:  Head CT 06/04/2015 FINDINGS: No intracranial hemorrhage, mass effect, or midline shift. No hydrocephalus. The basilar cisterns are patent. No evidence of territorial infarct. No intracranial fluid collection. Calvarium is intact. Included paranasal sinuses and mastoid air cells are well aerated. IMPRESSION: No acute intracranial abnormality. These results were called by telephone at the time of interpretation on 09/12/2015 at 9:30 pm to Dr. Gareth Morgan , who verbally acknowledged these results. Electronically Signed   By: Jeb Levering M.D.   On: 09/12/2015 21:30    EKG: Independently reviewed. Vent. rate 100 BPM PR interval 166 ms QRS duration 105 ms QT/QTc 369/476 ms P-R-T axes 64 -28 74 Sinus tachycardia Borderline left axis deviation Low voltage, precordial leads RSR' in V1 or V2, probably normal variant  Assessment/Plan Principal Problem:   TIA (transient ischemic attack)  admit to telemetry. Frequent neuro checks.  continue aspirin 81 mg by mouth daily. Continue warfarin per pharmacy consult.  check MRI /MRA of brain. Check echocardiogram and carotid Dopplers. Check hemoglobin A1c and fasting lipids.  Active Problems:   S/P Mitral valve replacement with mechanical valve and tricuspid valve repair    History of rheumatic heart disease  continue  anticoagulation with warfarin.  monitor PT/INR  check echocardiogram.    Thrombocytosis (HCC)  monitor platelets.     Dr. Wallie Char from the neuro hospitalists service evaluated the patient.  Code Status:  Full code. DVT Prophylaxis:  The patient is on warfarin at home. Family Communication:  Disposition Plan:  Admit to telemetry, continue stroke workup.  Time spent:  Over 70 minutes were spent in the process of his admission.  Reubin Milan [facility] Pager [phone].

## 2015-09-13 NOTE — Progress Notes (Signed)
ANTICOAGULATION CONSULT NOTE - Initial Consult  Pharmacy Consult for Coumadin Indication: MVR  Allergies  Allergen Reactions  . Oxycodone Itching    Vital Signs: Temp: 98.3 F (36.8 C) (12/17 2117) Temp Source: Oral (12/17 2117) BP: 127/82 mmHg (12/18 0200) Pulse Rate: 94 (12/18 0215)  Labs:  Recent Labs  09/12/15 2010  HGB 13.2  HCT 39.5  PLT 444*  LABPROT 24.5*  INR 2.23*  CREATININE 1.04*     Medical History: Past Medical History  Diagnosis Date  . Asthma     "as a baby"   . Anemia   . History of blood transfusion     "14 w/1st pregnancy; 2 w/last C-section" (03/15/2013)  . GERD (gastroesophageal reflux disease)   . Hypertension     no pcp   will go to [facility]  . Severe mitral regurgitation 04/22/2013  . SVT (supraventricular tachycardia) ([place]) 03/16/2013  . Acute diastolic heart failure ([place]) 03/15/2013  . 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 Tricuspid regurgitation   . S/P mitral valve replacement with metallic valve 123XX123    11mm Sorin Carbomedics mechanical prosthesis via right mini thoracotomy approach  . 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    Complex valvuloplasty including Cor-matrix ECM patch augmentation of anterior and lateral leaflets with 33mm Edwards mc3 ring annuloplasty via right mini-thoracotomy approach  . Complete heart block ([place]) 05/28/2013    Post-op  . Epigastric hernia 200's  . Hx of echocardiogram     Echo 4/16:  Mild focal basal septal hypertrophy, EF 60-65%, no RWMA, Mechanical MVR ok with mild central regurgitation and no perivalvular leak, small mobile density attached to valvular ring (1.5x1 cm) - post surgical changes vs SBE, mild LAE     Assessment: 54yo female c/o unilateral weakness/numbness, admitted for TIA w/u, to continue Coumadin for MVR; of note current INR is slightly below goal given higher goal for MVR, last dose of Coumadin taken PTA was 12/16.  Goal of Therapy:  INR 2.5-3.5   Plan:  Will  give boosted Coumadin dose of 10mg  po x1 today and monitor INR for dose adjustments; consider UFH bridge if INR drops.  Wynona Neat, PharmD, BCPS  09/13/2015,4:34 AM

## 2015-09-13 NOTE — ED Notes (Signed)
Pt. Transported to MRI from ED prior to floor transfer.

## 2015-09-13 NOTE — Progress Notes (Signed)
Patient is leaving AMA and has signed her paperwork. MD was paged and aware but pt not willing to wait for further testing. She states that she has a follow-up with her cardiologist next week and has called a cab. Arman Loy, Rande Brunt, RN

## 2015-09-13 NOTE — ED Notes (Signed)
Admitting at bedside 

## 2015-09-13 NOTE — Progress Notes (Signed)
STROKE TEAM PROGRESS NOTE   HISTORY Mary [name] is an 54 y.o. female history of hypertension, rheumatic heart disease, mitral valve replacement (metallic), tricuspid repair and complete heart block, presenting with new onset numbness involving left upper extremity as well as weakness involving left lower extremity. Patient has similar episode of transient left upper extremity numbness 2 weeks ago. Patient is on anticoagulation with Coumadin and INR tonight was 2.23. She also takes aspirin 81 mg per day. Right stroke score was 3. She was last known well at 9:30 PM tonight.  LSN: 9:30 PM on 09/12/2015 tPA Given: No: On Coumadin with INR 2.23 mRankin:   SUBJECTIVE (INTERVAL HISTORY) The patient reports she is feeling better.   OBJECTIVE Temp:  [98.3 F (36.8 C)-98.5 F (36.9 C)] 98.5 F (36.9 C) (12/18 0800) Pulse Rate:  [89-103] 100 (12/18 0800) Cardiac Rhythm:  [-]  Resp:  [13-29] 18 (12/18 0421) BP: (104-149)/(66-90) 128/75 mmHg (12/18 0800) SpO2:  [94 %-100 %] 99 % (12/18 0421) Weight:  [64.5 kg (142 lb 3.2 oz)] 64.5 kg (142 lb 3.2 oz) (12/18 0421)  CBC:  Recent Labs Lab 09/12/15 2010 09/13/15 0545  WBC 7.1 6.1  NEUTROABS 4.9  --   HGB 13.2 11.2*  HCT 39.5 33.6*  MCV 103.1* 103.1*  PLT 444* 123XX123    Basic Metabolic Panel:  Recent Labs Lab 09/12/15 2010 09/13/15 0545  NA 141 138  K 4.5 4.0  CL 108 109  CO2 21* 22  GLUCOSE 74 88  BUN 12 9  CREATININE 1.04* 0.94  CALCIUM 9.3 8.7*  MG  --  1.6*  PHOS  --  3.8    Lipid Panel:    Component Value Date/Time   CHOL 229* 09/13/2015 0545   TRIG 78 09/13/2015 0545   HDL 77 09/13/2015 0545   CHOLHDL 3.0 09/13/2015 0545   VLDL 16 09/13/2015 0545   LDLCALC 136* 09/13/2015 0545   HgbA1c:  Lab Results  Component Value Date   HGBA1C 5.4 05/20/2013   Urine Drug Screen:    Component Value Date/Time   LABOPIA NONE DETECTED 06/04/2015 1533   COCAINSCRNUR NONE DETECTED 06/04/2015 1533   LABBENZ NONE DETECTED  06/04/2015 1533   AMPHETMU NONE DETECTED 06/04/2015 1533   THCU POSITIVE* 06/04/2015 1533   LABBARB NONE DETECTED 06/04/2015 1533      IMAGING  Ct Head Wo Contrast 09/12/2015   No acute intracranial abnormality.     Mr Jodene Nam Head/brain Wo Cm 09/13/2015    MRI HEAD  1. No acute intracranial infarct or other process identified.  2. Probable tiny remote lacunar infarct within the left paramedian pons.  3. Otherwise brain MRI.   MRA HEAD   Normal intracranial MRA. No larger part for tear branch occlusion.  No high-grade or correctable stenosis.     PHYSICAL EXAM Mental Status: Alert, oriented, thought content appropriate. Speech fluent without evidence of aphasia. Able to follow 3 step commands. Cranial Nerves: II-Visual fields intact III/IV/VI-Pupils were equal and reacted normally to light. Extraocular movements intact.  V/VII-no facial numbness and no facial weakness. VIII-normal. X-normal speech and symmetrical palatal movement. XI: trapezius strength/neck flexion strength normal bilaterally XII- tongue extends midline Motor: Mild left lower extremity weakness otherwise 5/5 throughout. Sensory: Slightly decreased sensation to light touch on the left. Cerebellar: Finger to nose was slight difficulty in the left upper extremity Gait: Deferred    ASSESSMENT/PLAN Mary [name] is a 54 y.o. female with history of  hypertension, rheumatic heart disease,  mitral valve replacement (metallic), tricuspid repair and complete heart block presenting with new onset numbness involving left upper extremity as well as weakness involving left lower extremity.  She did not receive IV t-PA due to Coumadin therapy.  Possible TIA  Resultant   MRI  no acute infarct. Probable tiny remote lacunar infarct within the left paramedian pons.  MRA  normal  Carotid Doppler - pending  2D Echo - pending  LDL - 136  HgbA1c pending  VTE prophylaxis - warfarin  Diet NPO time  specified  aspirin 81 mg daily and warfarin daily prior to admission, now on aspirin 81 mg daily and warfarin daily  Patient counseled to be compliant with her antithrombotic medications  Ongoing aggressive stroke risk factor management  Therapy recommendations: Pending  Disposition: Pending  Hypertension  Mildly low at times  Permissive hypertension (OK if < 220/120) but gradually normalize in 5-7 days  Hyperlipidemia  Home meds:  No lipid lowering medications prior to admission.  LDL 136, goal < 70  Add Lipitor 40 mg daily  Continue statin at discharge   Other Stroke Risk Factors  Cigarette smoker, quit smoking 2 years ago.  ETOH use  Hx stroke/TIA  Family hx stroke (Father)  Prosthetic mitral valve - warfarin therapy  Other Active Problems  Mild anemia  UDS - positive for [facility] day # 1  Mikey Bussing PA-C [facility] Pager [phone] 09/13/2015, 4:54 PM     To contact Stroke Continuity provider, please refer to [email or web address] After hours, contact General Neurology

## 2015-09-14 LAB — HEMOGLOBIN A1C
HEMOGLOBIN A1C: 5.4 % (ref 4.8–5.6)
Mean Plasma Glucose: 108 mg/dL

## 2015-09-14 NOTE — Discharge Summary (Signed)
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 female with a past medical history of Rheumatic heart disease, On chronic anticoagulation with warfarin for S/P mitral valve replacement with mechanical valve, S/P tricuspid valve repair, GERD, SVT, Hypertension who comes to the emergency department due to left-sided extremities, upper > lower numbness and weakness since earlier this evening. She had a similar episode about 2 weeks ago that resolved spontaneously. She denies headache, vision changes, language or speech disturbances.  Hospital Course:  She was admitted for further workup, MRI negative for CVA, 2D echo and carotid dopplers could not be performed in the same day and needed to be done the next day however patient did not wait and decided to leave AMA in the evening. This MD did not have a chance to evaluate to discuss with the patient.  Procedures:  MRI   Consultations:  Neurology   Discharge Exam: Filed Vitals:   09/13/15 0421 09/13/15 0800 09/13/15 1331 09/13/15 1832  BP: 149/85 128/75 107/66 129/82  Pulse: 103 100 83 106  Temp: 98.3 F (36.8 C) 98.5 F (36.9 C) 98.6 F (37 C) 98.6 F (37 C)  TempSrc: Oral Oral Oral Oral  Resp: 18  20 20   Height: 5\' 2"  (1.575 m)     Weight: 64.5 kg (142 lb 3.2 oz)     SpO2: 99%  100%  100%    Discharge Instructions Activity:  As tolerated   Get Medicines reviewed and adjusted: Please take all your medications with you for your next visit with your Primary MD  Please request your Primary MD to go over all hospital tests and procedure/radiological results at the follow up, please ask your Primary MD to get all Hospital records sent to his/her office.  If you experience worsening of your admission symptoms, develop shortness of breath, life threatening emergency, suicidal or homicidal thoughts you must seek medical attention immediately by calling 911 or calling your MD immediately if symptoms less severe.  You must read complete instructions/literature along with all the possible adverse reactions/side effects for all the Medicines you take and that have been prescribed to you. Take any new Medicines after you have completely understood and accpet all the possible adverse reactions/side effects.   Do not drive when taking Pain medications.   Do not take more than prescribed Pain, Sleep and Anxiety Medications  Special Instructions: If you have smoked or chewed Tobacco in the last 2 yrs please stop smoking, stop any regular Alcohol and or any Recreational drug use.  Wear Seat belts while driving.  Please note  You were cared for by a hospitalist during your hospital stay. Once you are discharged, your primary care physician will handle any further medical issues. Please note that NO REFILLS for any discharge medications will be authorized once you are discharged, as it is imperative that  you return to your primary care physician (or establish a relationship with a primary care physician if you do not have one) for your aftercare needs so that they can reassess your need for medications and monitor your lab values.    Medication List    ASK your doctor about these medications        acetaminophen 500 MG tablet  Commonly known as:  TYLENOL  Take 500 mg by mouth  every 6 (six) hours as needed for mild pain or moderate pain.     aspirin EC 81 MG tablet  Take 1 tablet (81 mg total) by mouth daily.     ranitidine 150 MG capsule  Commonly known as:  ZANTAC  Take 1 capsule (150 mg total) by mouth daily.     warfarin 5 MG tablet  Commonly known as:  COUMADIN  Take as directed by coumadin clinic     [facility] HAZEL EX  Apply 1 application topically at bedtime as needed (knee pain).         The results of significant diagnostics from this hospitalization (including imaging, microbiology, ancillary and laboratory) are listed below for reference.    Significant Diagnostic Studies: Dg Chest 2 View  09/13/2015  CLINICAL DATA:  LEFT leg weakness since yesterday, former smoker, hypertension, TIA EXAM: CHEST  2 VIEW COMPARISON:  06/04/2015 FINDINGS: Enlargement of cardiac silhouette post TVR and MVR. Mediastinal contours and pulmonary vascularity normal. Lungs clear. No pleural effusion or pneumothorax. Bones unremarkable. IMPRESSION: Mild enlargement of cardiac silhouette post TVR and MVR. No acute infiltrates. Electronically Signed   By: Ulyses Southward M.D.   On: 09/13/2015 10:09   Ct Head Wo Contrast  09/12/2015  CLINICAL DATA:  Code stroke.  Left arm tingling and numbness. EXAM: CT HEAD WITHOUT CONTRAST TECHNIQUE: Contiguous axial images were obtained from the base of the skull through the vertex without intravenous contrast. COMPARISON:  Head CT 06/04/2015 FINDINGS: No intracranial hemorrhage, mass effect, or midline shift. No hydrocephalus. The basilar cisterns are patent. No evidence of territorial infarct. No intracranial fluid collection. Calvarium is intact. Included paranasal sinuses and mastoid air cells are well aerated. IMPRESSION: No acute intracranial abnormality. These results were called by telephone at the time of interpretation on 09/12/2015 at 9:30 pm to Dr. Alvira Monday , who verbally acknowledged these results. Electronically Signed   By:  Rubye Oaks M.D.   On: 09/12/2015 21:30   Mr Brain Wo Contrast  09/13/2015  CLINICAL DATA:  Initial evaluation for acute onset left sided numbness and weakness EXAM: MRI HEAD WITHOUT CONTRAST MRA HEAD WITHOUT CONTRAST TECHNIQUE: Multiplanar, multiecho pulse sequences of the brain and surrounding structures were obtained without intravenous contrast. Angiographic images of the head were obtained using MRA technique without contrast. COMPARISON:  Prior head CT from 09/12/2015 FINDINGS: MRI HEAD FINDINGS Cerebral volume within normal limits for patient age. A single small focus of T2/FLAIR hyperintensity seen within the left periatrial white matter, nonspecific, but most likely related to chronic small vessel ischemic disease. No other significant white matter changes present. A probable tiny remote lacunar infarct present within the left paramedian pons (series 10, image 44). No other remote infarcts identified. No abnormal foci of restricted diffusion to suggest acute intracranial infarct. Gray-white matter differentiation maintained. Normal intracranial vascular flow voids preserved. No acute intracranial hemorrhage. Possible tiny focus of susceptibility artifact within the parasagittal left frontal lobe (series 9, image 19). Additional possible tiny focus of susceptibility artifact within the cortex of the right  parietal lobe (series 9, image 22). These are of doubtful clinical significance. No mass lesion, midline shift, or mass effect. No hydrocephalus. No extra-axial fluid collection. Craniocervical junction within normal limits. Pituitary gland normal.  No acute abnormality about the orbits. Mild mucosal thickening within the maxillary sinuses and ethmoidal air cells. Paranasal sinuses are otherwise clear. No mastoid effusion. Inner ear structures normal. Bone marrow signal intensity within normal limits. No scalp soft tissue abnormality. MRA HEAD FINDINGS ANTERIOR CIRCULATION: Visualized distal  cervical segments of the internal carotid arteries are patent with antegrade flow. The petrous segments are widely patent. Cavernous and supraclinoid segments well opacified. A1 segments widely patent. Anterior communicating artery normal. Anterior cerebral arteries well opacified. M1 segments patent without stenosis or occlusion. MCA bifurcations normal. Distal MCA branches symmetric and well opacified. POSTERIOR CIRCULATION: Vertebral arteries are patent to the vertebrobasilar junction. Posterior inferior cerebral arteries patent bilaterally. Basilar artery widely patent. Superior cerebral arteries patent bilaterally. Left P1 and P2 segments well opacified. Fetal origin of the right PCA with widely patent right posterior communicating artery. No aneurysm or vascular malformation. IMPRESSION: MRI HEAD IMPRESSION: 1. No acute intracranial infarct or other process identified. 2. Probable tiny remote lacunar infarct within the left paramedian pons. 3. Otherwise brain MRI. MRA HEAD IMPRESSION: Normal intracranial MRA. No larger part for tear branch occlusion. No high-grade or correctable stenosis. Electronically Signed   By: Rise Mu M.D.   On: 09/13/2015 04:38   Mr Maxine Glenn Head/brain Wo Cm  09/13/2015  CLINICAL DATA:  Initial evaluation for acute onset left sided numbness and weakness EXAM: MRI HEAD WITHOUT CONTRAST MRA HEAD WITHOUT CONTRAST TECHNIQUE: Multiplanar, multiecho pulse sequences of the brain and surrounding structures were obtained without intravenous contrast. Angiographic images of the head were obtained using MRA technique without contrast. COMPARISON:  Prior head CT from 09/12/2015 FINDINGS: MRI HEAD FINDINGS Cerebral volume within normal limits for patient age. A single small focus of T2/FLAIR hyperintensity seen within the left periatrial white matter, nonspecific, but most likely related to chronic small vessel ischemic disease. No other significant white matter changes present. A  probable tiny remote lacunar infarct present within the left paramedian pons (series 10, image 44). No other remote infarcts identified. No abnormal foci of restricted diffusion to suggest acute intracranial infarct. Gray-white matter differentiation maintained. Normal intracranial vascular flow voids preserved. No acute intracranial hemorrhage. Possible tiny focus of susceptibility artifact within the parasagittal left frontal lobe (series 9, image 19). Additional possible tiny focus of susceptibility artifact within the cortex of the right parietal lobe (series 9, image 22). These are of doubtful clinical significance. No mass lesion, midline shift, or mass effect. No hydrocephalus. No extra-axial fluid collection. Craniocervical junction within normal limits. Pituitary gland normal.  No acute abnormality about the orbits. Mild mucosal thickening within the maxillary sinuses and ethmoidal air cells. Paranasal sinuses are otherwise clear. No mastoid effusion. Inner ear structures normal. Bone marrow signal intensity within normal limits. No scalp soft tissue abnormality. MRA HEAD FINDINGS ANTERIOR CIRCULATION: Visualized distal cervical segments of the internal carotid arteries are patent with antegrade flow. The petrous segments are widely patent. Cavernous and supraclinoid segments well opacified. A1 segments widely patent. Anterior communicating artery normal. Anterior cerebral arteries well opacified. M1 segments patent without stenosis or occlusion. MCA bifurcations normal. Distal MCA branches symmetric and well opacified. POSTERIOR CIRCULATION: Vertebral arteries are patent to the vertebrobasilar junction. Posterior inferior cerebral arteries patent bilaterally. Basilar artery widely patent. Superior cerebral arteries patent bilaterally. Left P1 and  P2 segments well opacified. Fetal origin of the right PCA with widely patent right posterior communicating artery. No aneurysm or vascular malformation.  IMPRESSION: MRI HEAD IMPRESSION: 1. No acute intracranial infarct or other process identified. 2. Probable tiny remote lacunar infarct within the left paramedian pons. 3. Otherwise brain MRI. MRA HEAD IMPRESSION: Normal intracranial MRA. No larger part for tear branch occlusion. No high-grade or correctable stenosis. Electronically Signed   By: Rise Mu M.D.   On: 09/13/2015 04:38    Microbiology: No results found for this or any previous visit (from the past 240 hour(s)).   Labs: Basic Metabolic Panel:  Recent Labs Lab 09/12/15 2010 09/13/15 0545  NA 141 138  K 4.5 4.0  CL 108 109  CO2 21* 22  GLUCOSE 74 88  BUN 12 9  CREATININE 1.04* 0.94  CALCIUM 9.3 8.7*  MG  --  1.6*  PHOS  --  3.8   Liver Function Tests:  Recent Labs Lab 09/12/15 2010 09/13/15 0545  AST 32 29  ALT 15 14  ALKPHOS 115 100  BILITOT 0.4 0.6  PROT 7.2 5.9*  ALBUMIN 3.6 2.7*   No results for input(s): LIPASE, AMYLASE in the last 168 hours. No results for input(s): AMMONIA in the last 168 hours. CBC:  Recent Labs Lab 09/12/15 2010 09/13/15 0545  WBC 7.1 6.1  NEUTROABS 4.9  --   HGB 13.2 11.2*  HCT 39.5 33.6*  MCV 103.1* 103.1*  PLT 444* 368   BNP: BNP (last 3 results)  Recent Labs  11/25/14 1100  BNP 36.5    ProBNP (last 3 results)  Recent Labs  12/29/14 0945  PROBNP 22.0    CBG:  Recent Labs Lab 09/12/15 2010  GLUCAP 73       Signed:  Colene Mines  [facility] 09/14/2015, 4:25 PM

## 2015-10-09 ENCOUNTER — Ambulatory Visit (INDEPENDENT_AMBULATORY_CARE_PROVIDER_SITE_OTHER): Payer: Self-pay | Admitting: *Deleted

## 2015-10-09 DIAGNOSIS — Z954 Presence of other heart-valve replacement: Secondary | ICD-10-CM

## 2015-10-09 DIAGNOSIS — Z7901 Long term (current) use of anticoagulants: Secondary | ICD-10-CM

## 2015-10-09 DIAGNOSIS — Z952 Presence of prosthetic heart valve: Secondary | ICD-10-CM

## 2015-10-09 DIAGNOSIS — I059 Rheumatic mitral valve disease, unspecified: Secondary | ICD-10-CM

## 2015-10-09 LAB — POCT INR: INR: 1.6

## 2015-10-09 MED FILL — WARFARIN SODIUM 5 MG TABLET: 5 | 30 days supply | Qty: 40 | Fill #3

## 2015-10-16 ENCOUNTER — Ambulatory Visit (INDEPENDENT_AMBULATORY_CARE_PROVIDER_SITE_OTHER): Payer: Self-pay | Admitting: *Deleted

## 2015-10-16 DIAGNOSIS — Z952 Presence of prosthetic heart valve: Secondary | ICD-10-CM

## 2015-10-16 DIAGNOSIS — I059 Rheumatic mitral valve disease, unspecified: Secondary | ICD-10-CM

## 2015-10-16 DIAGNOSIS — Z7901 Long term (current) use of anticoagulants: Secondary | ICD-10-CM

## 2015-10-16 DIAGNOSIS — Z954 Presence of other heart-valve replacement: Secondary | ICD-10-CM

## 2015-10-16 LAB — POCT INR: INR: 2.8

## 2015-10-20 NOTE — Progress Notes (Signed)
Cardiology Office Note:    Date:  10/21/2015   ID:  Mary [name], DOB 06-23-1961, MRN [id number]  PCP:  Loralie Champagne, MD  Cardiologist:  Dr. Loralie Champagne   Electrophysiologist:  Dr. Cristopher Peru   Chief Complaint  Patient presents with  . s/p MV Replacement    Follow up    History of Present Illness:    Mary [name] is a 55 y.o. 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 She underwent R mini-thoracotomy with mechanical MVR and TV repair. Post op course c/b CHB that recovered without needing a PPM. She was kept off all AVN blocking agents. She wore a Holter monitor in 7/15 and was noted to have asymptomatic CHB while sleeping. She saw Dr. Cristopher Peru. Pacemaker was not recommended as CHB only occurred during sleep.   I saw her in 4/16 with complaints of palpitations and near syncope. Event Monitor was arranged but never done. Echocardiogram 12/2014 demonstrated normal LV function, normally functioning mechanical mitral valve. There was a small mobile echodensity attached to valvular ring. However, I reviewed this with Dr. Johnsie Cancel. It was felt to be related to redundant cord or suture from surgery.  Last seen by me in 7/16.   She was admitted in 12/16 with L sided weakness concerning for TIA. MRI and CT were neg for acute CVA.  Patient left AMA before further testing could be done.   Of note, INR was less than 2.5 (2.23; 2.06).  She returns for follow-up. As I walk in the room, she is tearful. She tells her that she has anxiety and frequent panic attacks. She denies chest discomfort or significant dyspnea. She continues to have rapid palpitations. She denies syncope. She sleeps on 3 pillows chronically. She denies PND or edema. She has had some epistaxis. She had some rectal bleeding secondary to hemorrhoids several months ago. She denies any further bleeding. She denies any further left-sided weakness.   Past Medical History  Diagnosis Date  . Asthma    "as a baby"   . Anemia   . History of blood transfusion     "14 w/1st pregnancy; 2 w/last C-section" (03/15/2013)  . GERD (gastroesophageal reflux disease)   . Hypertension     no pcp   will go to [facility]  . Severe mitral regurgitation 04/22/2013  . SVT (supraventricular tachycardia) ([place]) 03/16/2013  . Acute diastolic heart failure ([place]) 03/15/2013  . 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 Tricuspid regurgitation   . S/P mitral valve replacement with metallic valve [id number]    82mm Sorin Carbomedics mechanical prosthesis via right mini thoracotomy approach  . 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    Complex valvuloplasty including Cor-matrix ECM patch augmentation of anterior and lateral leaflets with 55mm Edwards mc3 ring annuloplasty via right mini-thoracotomy approach  . Complete heart block ([place]) 05/28/2013    Post-op  . Epigastric hernia 200's  . Hx of echocardiogram     Echo 4/16:  Mild focal basal septal hypertrophy, EF 60-65%, no RWMA, Mechanical MVR ok with mild central regurgitation and no perivalvular leak, small mobile density attached to valvular ring (1.5x1 cm) - post surgical changes vs SBE, mild LAE  1. 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 In 8/14, had right mini-thoracotomy with mechanical mitral valve replacement (St Jude) + tricuspid valve repair. Echo (9/14): EF 55-60%, well-seated mechanical mitral valve. 2. Post-op complete heart block in 8/14, resolved.  3. LHC (  6/14) with no significant coronary artery disease.  4. Right femoral hernia repair 8/14.   Past Surgical History  Procedure Laterality Date  . Appendectomy  ~1978  . Cesarean section  1983; 1999  . Laparoscopic cholecystectomy  2003  . Cesarean section with bilateral tubal ligation  1999  . Tee without cardioversion N/A 03/18/2013    Procedure: TRANSESOPHAGEAL ECHOCARDIOGRAM (TEE);  Surgeon: Larey Dresser, MD;  Location: [facility];  Service: Cardiovascular;   Laterality: N/A;  . Cardiac catheterization    . Multiple extractions with alveoloplasty N/A 04/04/2013    Procedure: Extraction of tooth #'s 1,8,9,13,14,15,23,24,25,26 with alveoloplasty and gross debridement of remaining teeth;  Surgeon: Lenn Cal, DDS;  Location: [facility];  Service: Oral Surgery;  Laterality: N/A;  . Minimally invasive tricuspid valve repair Right 05/23/2013    Procedure: MINIMALLY INVASIVE TRICUSPID VALVE REPAIR;  Surgeon: Rexene Alberts, MD;  Location: [facility];  Service: Open Heart Surgery;  Laterality: Right;  . Intraoperative transesophageal echocardiogram N/A 05/23/2013    Procedure: INTRAOPERATIVE TRANSESOPHAGEAL ECHOCARDIOGRAM;  Surgeon: Rexene Alberts, MD;  Location: [facility];  Service: Open Heart Surgery;  Laterality: N/A;  . Femoral hernia repair Right 05/23/2013    Procedure: HERNIA REPAIR FEMORAL;  Surgeon: Rexene Alberts, MD;  Location: [facility];  Service: Open Heart Surgery;  Laterality: Right;  . Mitral valve replacement N/A 05/23/2013    Procedure: MITRAL VALVE (MV) REPLACEMENT;  Surgeon: Rexene Alberts, MD;  Location: [facility];  Service: Open Heart Surgery;  Laterality: N/A;  . Tee without cardioversion N/A 06/17/2013    Procedure: TRANSESOPHAGEAL ECHOCARDIOGRAM (TEE);  Surgeon: Lelon Perla, MD;  Location: [facility] ENDOSCOPY;  Service: Cardiovascular;  Laterality: N/A;  . Left and right heart catheterization with coronary angiogram N/A 03/22/2013    Procedure: LEFT AND RIGHT HEART CATHETERIZATION WITH CORONARY ANGIOGRAM;  Surgeon: Burnell Blanks, MD;  Location: [facility] CATH LAB;  Service: Cardiovascular;  Laterality: N/A;    Current Medications: Outpatient Prescriptions Prior to Visit  Medication Sig Dispense Refill  . acetaminophen (TYLENOL) 500 MG tablet Take 500 mg by mouth every 6 (six) hours as needed for mild pain or moderate pain.    [name] aspirin EC 81 MG tablet Take 1 tablet (81 mg total) by mouth daily.    . ranitidine (ZANTAC) 150 MG capsule Take 1 capsule  (150 mg total) by mouth daily. 30 capsule 0  . WITCH HAZEL EX Apply 1 application topically at bedtime as needed (knee pain).     [name] warfarin (COUMADIN) 5 MG tablet Take as directed by coumadin clinic (Patient taking differently: Take 5 mg by mouth daily at 6 PM. Take 1 1/2 tablets (7.5 mg) by mouth on Monday, Wednesday and Friday, take 1 tablet (5 mg) on Sunday, Tuesday, Thursday, Saturday) 40 tablet 3   No facility-administered medications prior to visit.     Allergies:   Oxycodone   Social History   Social History  . Marital Status: Single    Spouse Name: N/A  . Number of Children: N/A  . Years of Education: N/A   Social History Main Topics  . Smoking status: Former Smoker -- 0.50 packs/day for 36 years    Types: Cigarettes    Quit date: 03/15/2013  . Smokeless tobacco: Never Used  . Alcohol Use: 3.6 oz/week    6 Glasses of wine per week     Comment: 03/15/2013 "bottle of wine/wk"  . Drug Use: No  . Sexual Activity: No  Other Topics Concern  . None   Social History Narrative     Family History:  The patient's family history includes Cancer in her mother; Sarcoidosis in her sister; Stroke in her father. There is no history of Heart attack.   ROS:   Please see the history of present illness.    Review of Systems  HENT: Positive for nosebleeds.   Musculoskeletal: Positive for joint pain and myalgias.  All other systems reviewed and are negative.   Physical Exam:    VS:  BP 126/74 mmHg  Pulse 110  Ht 5\' 3"  (1.6 m)  Wt 139 lb (63.05 kg)  BMI 24.63 kg/m2  LMP 01/28/2013   GEN: Well nourished, well developed, in no acute distress HEENT: normal Neck: no JVD, no masses Cardiac: Mechanical S1/ Normal S2, RRR; no murmurs, no edema    Respiratory: clear to auscultation bilaterally; no wheezing, rhonchi or rales GI: soft, nontender  MS: no deformity or atrophy Skin: warm and dry, no rash Neuro:  no focal deficits  Psych: Alert and oriented x 3, normal affect  Wt  Readings from Last 3 Encounters:  10/21/15 139 lb (63.05 kg)  09/13/15 142 lb 3.2 oz (64.5 kg)  07/31/15 138 lb (62.596 kg)      Studies/Labs Reviewed:    EKG:  EKG is  ordered today.  The ekg ordered today demonstrates sinus tach, HR 100-110, LAD, nonspecific ST-T wave changes, QTc 330 ms  Recent Labs: 11/25/2014: B Natriuretic Peptide 36.5 12/29/2014: Pro B Natriuretic peptide (BNP) 22.0 01/01/2015: TSH 1.64 09/13/2015: ALT 14; BUN 9; Creatinine, Ser 0.94; Hemoglobin 11.2*; Magnesium 1.6*; Platelets 368; Potassium 4.0; Sodium 138   Recent Lipid Panel    Component Value Date/Time   CHOL 229* 09/13/2015 0545   TRIG 78 09/13/2015 0545   HDL 77 09/13/2015 0545   CHOLHDL 3.0 09/13/2015 0545   VLDL 16 09/13/2015 0545   LDLCALC 136* 09/13/2015 0545    Additional studies/ records that were reviewed today include:   MRI/MRA Head/Neck 09/13/15 IMPRESSION: MRI HEAD IMPRESSION: 1. No acute intracranial infarct or other process identified. 2. Probable tiny remote lacunar infarct within the left paramedianpons. 3. Otherwise brain MRI. MRA HEAD IMPRESSION: Normal intracranial MRA. No larger part for tear branch occlusion.  No high-grade or correctable stenosis.  Echo 01/01/15 Mild focal basal septal hypertrophy, EF 60-65%, normal wall motion, S/P MVR (St Jude mechanical valve) functioning well, mild MR (small mobile echodensity-likely redundant chord versus suture), mild LAE, TV mean gradient 4 mmHg.   Echo 7/15 Mild LVH, EF 60-65%, normal wall motion, grade 1 diastolic dysfunction, S/P MVR (St Jude mechanical valve) and appears functioning well, mild MR, no perivalvular leak, mild LAE, mildly reduced RVSF.   Carotid US 8/14 Bilateral - 1% to 39% ICA stenosis   Cardiac cath 6/14 Left main: No obstructive disease.  Left Anterior Descending Artery: No obstructive disease noted.  Circumflex Artery: No obstructive disease.  Right Coronary Artery: no obstructive disease.  Left  Ventricular Angiogram: LVEF 65-70%. 3+ MR Distal Aortogram: No aneurysm. No stenosis.    ASSESSMENT:    1. Palpitations   2. CHB (complete heart block) (HCC)   3. S/P minimally-invasive mitral valve replacement with mechanical valve and tricuspid valve repair   4. History of TIA (transient ischemic attack)   5. Anxiety     PLAN:    In order of problems listed above:  1. Palpitations -  She has had a history of SVT. She's also had a  history of complete heart block. Holter monitor 2015 demonstrated complete heart block during hours of sleep. There was no indication for pacemaker. She has remained off of AV nodal blocking agents. She continues to have sinus tachycardia. She is somewhat symptomatic with this. Obtain BMET, CBC, TSH. I will review further with Dr. Aundra Dubin whether or not we should pursue another event monitor or another trial of beta blocker therapy.   2. Complete heart block - As noted, this resolved after her surgery. She did have complete heart block noted during hours of sleep during Holter monitor 2015. This was felt to be asymptomatic and did not require pacemaker implantation. As noted, I will review further with Dr. Aundra Dubin regarding whether or not to pursue repeat monitor and/or beta blocker therapy.  3. S/P minimally-invasive mitral valve replacement with mechanical valve and tricuspid valve repair - Follow-up with the Coumadin clinic as planned.  Continue SBE prophylaxis. Recent history of TIA was likely related to mildly subtherapeutic INR. However, she never underwent echocardiogram or carotid US.  4. History of TIA -  No recurrence. MRI was negative for acute CVA. She did have evidence of a remote tiny lacunar infarct within the left paramedian pons. She remains on Coumadin and aspirin. She does not have primary care. I will arrange follow-up carotid US as well as 2-D echocardiogram.  5. Anxiety - As noted she does not have primary care. I will refer her to the [facility] for establishment.    Medication Adjustments/Labs and Tests Ordered: Current medicines are reviewed at length with the patient today.  Concerns regarding medicines are outlined above.  Medication changes, Labs and Tests ordered today are outlined in the Patient Instructions noted below. Patient Instructions  Medication Instructions:  Your physician recommends that you continue on your current medications as directed. Please refer to the Current Medication list given to you today.   Labwork: TODAY BMET, CBC W/DIFF, TSH  Testing/Procedures: 1. Your physician has requested that you have an echocardiogram. Echocardiography is a painless test that uses sound waves to create images of your heart. It provides your doctor with information about the size and shape of your heart and how well your heart's chambers and valves are working. This procedure takes approximately one hour. There are no restrictions for this procedure.  2. Your physician has requested that you have a carotid duplex. This test is an ultrasound of the carotid arteries in your neck. It looks at blood flow through these arteries that supply the brain with blood. Allow one hour for this exam. There are no restrictions or special instructions.   Follow-Up: Your physician wants you to follow-up in: [place] DR. Aundra Dubin You will receive a reminder letter in the mail two months in advance. If you don't receive a letter, please call our office to schedule the follow-up appointment.   Any Other Special Instructions Will Be Listed Below (If Applicable). YOU ARE BEING REFERRED TO [facility] TO EST WITH A PRIMARY CARE  If you need a refill on your cardiac medications before your next appointment, please call your pharmacy.       Signed, Richardson Dopp, PA-C  10/21/2015 9:45 AM    [facility] [place], [place], [place]   [address] Phone: [phone]; Fax: [phone]

## 2015-10-21 ENCOUNTER — Ambulatory Visit (INDEPENDENT_AMBULATORY_CARE_PROVIDER_SITE_OTHER): Payer: Self-pay | Admitting: Physician Assistant

## 2015-10-21 ENCOUNTER — Encounter: Payer: Self-pay | Admitting: Physician Assistant

## 2015-10-21 ENCOUNTER — Telehealth: Payer: Self-pay | Admitting: *Deleted

## 2015-10-21 ENCOUNTER — Other Ambulatory Visit: Payer: Self-pay | Admitting: *Deleted

## 2015-10-21 VITALS — BP 126/74 | HR 110 | Ht 63.0 in | Wt 139.0 lb

## 2015-10-21 DIAGNOSIS — Z8673 Personal history of transient ischemic attack (TIA), and cerebral infarction without residual deficits: Secondary | ICD-10-CM

## 2015-10-21 DIAGNOSIS — I442 Atrioventricular block, complete: Secondary | ICD-10-CM

## 2015-10-21 DIAGNOSIS — Z954 Presence of other heart-valve replacement: Secondary | ICD-10-CM

## 2015-10-21 DIAGNOSIS — R002 Palpitations: Secondary | ICD-10-CM

## 2015-10-21 DIAGNOSIS — F419 Anxiety disorder, unspecified: Secondary | ICD-10-CM

## 2015-10-21 LAB — CBC WITH DIFFERENTIAL/PLATELET
BASOS ABS: 0 10*3/uL (ref 0.0–0.1)
Basophils Relative: 0 % (ref 0–1)
EOS PCT: 6 % — AB (ref 0–5)
Eosinophils Absolute: 0.4 10*3/uL (ref 0.0–0.7)
HEMATOCRIT: 41.3 % (ref 36.0–46.0)
Hemoglobin: 13.9 g/dL (ref 12.0–15.0)
LYMPHS ABS: 1.2 10*3/uL (ref 0.7–4.0)
LYMPHS PCT: 17 % (ref 12–46)
MCH: 34.2 pg — ABNORMAL HIGH (ref 26.0–34.0)
MCHC: 33.7 g/dL (ref 30.0–36.0)
MCV: 101.7 fL — AB (ref 78.0–100.0)
MPV: 11.2 fL (ref 8.6–12.4)
Monocytes Absolute: 0.4 10*3/uL (ref 0.1–1.0)
Monocytes Relative: 6 % (ref 3–12)
NEUTROS PCT: 71 % (ref 43–77)
Neutro Abs: 5 10*3/uL (ref 1.7–7.7)
Platelets: 479 10*3/uL — ABNORMAL HIGH (ref 150–400)
RBC: 4.06 MIL/uL (ref 3.87–5.11)
RDW: 14.3 % (ref 11.5–15.5)
WBC: 7 10*3/uL (ref 4.0–10.5)

## 2015-10-21 LAB — BASIC METABOLIC PANEL
BUN: 6 mg/dL — AB (ref 7–25)
CO2: 24 mmol/L (ref 20–31)
Calcium: 9.3 mg/dL (ref 8.6–10.4)
Chloride: 107 mmol/L (ref 98–110)
Creat: 0.82 mg/dL (ref 0.50–1.05)
Glucose, Bld: 80 mg/dL (ref 65–99)
POTASSIUM: 4.7 mmol/L (ref 3.5–5.3)
Sodium: 138 mmol/L (ref 135–146)

## 2015-10-21 LAB — TSH: TSH: 1.918 u[IU]/mL (ref 0.350–4.500)

## 2015-10-21 MED ORDER — WARFARIN SODIUM 5 MG PO TABS: ORAL_TABLET | ORAL | Status: DC

## 2015-10-21 NOTE — Telephone Encounter (Signed)
Lmptcb to go over results 

## 2015-10-21 NOTE — Patient Instructions (Signed)
Medication Instructions:  Your physician recommends that you continue on your current medications as directed. Please refer to the Current Medication list given to you today.   Labwork: TODAY BMET, CBC W/DIFF, TSH  Testing/Procedures: 1. Your physician has requested that you have an echocardiogram. Echocardiography is a painless test that uses sound waves to create images of your heart. It provides your doctor with information about the size and shape of your heart and how well your heart's chambers and valves are working. This procedure takes approximately one hour. There are no restrictions for this procedure.  2. Your physician has requested that you have a carotid duplex. This test is an ultrasound of the carotid arteries in your neck. It looks at blood flow through these arteries that supply the brain with blood. Allow one hour for this exam. There are no restrictions or special instructions.   Follow-Up: Your physician wants you to follow-up in: [place] DR. Aundra Dubin You will receive a reminder letter in the mail two months in advance. If you don't receive a letter, please call our office to schedule the follow-up appointment.   Any Other Special Instructions Will Be Listed Below (If Applicable). YOU ARE BEING REFERRED TO [facility] TO EST WITH A PRIMARY CARE  If you need a refill on your cardiac medications before your next appointment, please call your pharmacy.

## 2015-10-22 NOTE — Telephone Encounter (Signed)
Lmptcb x 2 

## 2015-10-22 NOTE — Telephone Encounter (Signed)
Lmptcb x 3 to go over results

## 2015-10-26 ENCOUNTER — Other Ambulatory Visit: Payer: Self-pay | Admitting: Physician Assistant

## 2015-10-26 ENCOUNTER — Encounter: Payer: Self-pay | Admitting: *Deleted

## 2015-10-26 DIAGNOSIS — I6523 Occlusion and stenosis of bilateral carotid arteries: Secondary | ICD-10-CM

## 2015-10-26 DIAGNOSIS — Z8673 Personal history of transient ischemic attack (TIA), and cerebral infarction without residual deficits: Secondary | ICD-10-CM

## 2015-10-26 NOTE — Telephone Encounter (Signed)
Tried to reach pt one more time to go over results. I will send out letter for ptcb to discuss results.

## 2015-10-30 ENCOUNTER — Encounter: Payer: Self-pay | Admitting: *Deleted

## 2015-11-03 ENCOUNTER — Ambulatory Visit (HOSPITAL_COMMUNITY)
Admission: RE | Admit: 2015-11-03 | Discharge: 2015-11-03 | Disposition: A | Discharge status: Home / Self Care | Payer: Medicaid Other | Source: Ambulatory Visit | Attending: Physician Assistant | Admitting: Physician Assistant

## 2015-11-03 ENCOUNTER — Ambulatory Visit (INDEPENDENT_AMBULATORY_CARE_PROVIDER_SITE_OTHER): Payer: Medicaid Other | Admitting: *Deleted

## 2015-11-03 ENCOUNTER — Other Ambulatory Visit: Payer: Self-pay

## 2015-11-03 ENCOUNTER — Ambulatory Visit (HOSPITAL_COMMUNITY): Payer: Medicaid Other | Attending: Physician Assistant

## 2015-11-03 DIAGNOSIS — I10 Essential (primary) hypertension: Secondary | ICD-10-CM | POA: Insufficient documentation

## 2015-11-03 DIAGNOSIS — Z7901 Long term (current) use of anticoagulants: Secondary | ICD-10-CM

## 2015-11-03 DIAGNOSIS — I059 Rheumatic mitral valve disease, unspecified: Secondary | ICD-10-CM

## 2015-11-03 DIAGNOSIS — Z954 Presence of other heart-valve replacement: Secondary | ICD-10-CM

## 2015-11-03 DIAGNOSIS — Z8673 Personal history of transient ischemic attack (TIA), and cerebral infarction without residual deficits: Secondary | ICD-10-CM | POA: Insufficient documentation

## 2015-11-03 DIAGNOSIS — I517 Cardiomegaly: Secondary | ICD-10-CM | POA: Diagnosis not present

## 2015-11-03 DIAGNOSIS — I6523 Occlusion and stenosis of bilateral carotid arteries: Secondary | ICD-10-CM | POA: Diagnosis not present

## 2015-11-03 DIAGNOSIS — Z952 Presence of prosthetic heart valve: Secondary | ICD-10-CM

## 2015-11-03 LAB — POCT INR: INR: 2.9

## 2015-11-04 ENCOUNTER — Telehealth: Payer: Self-pay | Admitting: *Deleted

## 2015-11-04 ENCOUNTER — Encounter: Payer: Self-pay | Admitting: Physician Assistant

## 2015-11-04 NOTE — Telephone Encounter (Signed)
Lmptcb for echo results

## 2015-11-05 NOTE — Telephone Encounter (Signed)
Pt has been notified of both carotid and echo results by phone with verbal understanding.

## 2015-11-06 ENCOUNTER — Telehealth: Payer: Self-pay | Admitting: Physician Assistant

## 2015-11-06 NOTE — Telephone Encounter (Signed)
Reviewed with Dr. Loralie Champagne Let's set her up for a [place]. Holter Dx Sinus tachy We want to see if she still has those slow rhythms during sleep that we saw a few years ago. If not, we may be able to add back a med to help with her fast HR Richardson Dopp, PA-C   11/06/2015 2:17 PM

## 2015-11-09 NOTE — Telephone Encounter (Signed)
Lmptcb to discuss of recommendation per Brynda Rim. PA and Dr. Aundra Dubin to have pt wear 48 hour holter monitor.

## 2015-11-12 NOTE — Telephone Encounter (Signed)
Lmptcb x 2 to advise pt Dr. Aundra Dubin and Brynda Rim. PA to wear 48 hour holter monitor.

## 2015-11-13 ENCOUNTER — Telehealth: Payer: Self-pay | Admitting: Physician Assistant

## 2015-11-13 DIAGNOSIS — R000 Tachycardia, unspecified: Secondary | ICD-10-CM

## 2015-11-13 NOTE — Telephone Encounter (Signed)
Received message from schedulers that pt is self pay and cannot afford monitor. She declines to wear at this time.

## 2015-11-13 NOTE — Telephone Encounter (Signed)
Spoke with pt and told her Richardson Dopp, [place] and Dr. Aundra Dubin would like her to wear 48 hour holter. I told pt I would have schedulers contact her with appt time.

## 2015-11-13 NOTE — Telephone Encounter (Signed)
Follow up      Calling to get monitor results

## 2015-11-16 NOTE — Telephone Encounter (Signed)
See phone note from 2/17 pt cannot afford monitor due to she is self pay and she declines monitor at this time.

## 2015-11-17 NOTE — Progress Notes (Signed)
Reviewed case with Dr. Loralie Champagne. He suggested that it would be reasonable to repeat a Holter monitor.  However, the patient could not afford to have monitor placed. Will hold off on setting up a monitor for now.  Richardson Dopp, PA-C   11/17/2015 11:03 AM

## 2015-11-27 ENCOUNTER — Other Ambulatory Visit: Payer: Self-pay | Admitting: Internal Medicine

## 2015-11-30 ENCOUNTER — Telehealth: Payer: Self-pay

## 2015-11-30 MED ORDER — WARFARIN SODIUM 5 MG PO TABS: ORAL_TABLET | ORAL | Status: DC

## 2015-11-30 NOTE — Telephone Encounter (Signed)
Pt called confirm that her Warfarin was sent to [facility]. It was refilled this morning but it was sent to [facility] on [address]. I  warfarin (COUMADIN) 5 MG tablet  Medication   Date: 11/30/2015  Department: [facility] Office  Ordering/Authorizing: Evans Lance, MD      Order Providers    Prescribing Provider Encounter Provider   Evans Lance, MD Evans Lance, MD    Medication Detail      Disp Refills Start End     warfarin (COUMADIN) 5 MG tablet 120 tablet 1 11/30/2015     Sig: TAKE ONE TABLET BY MOUTH ONCE DAILY AS DIRECTED    Notes to Pharmacy: 120 tablets is a 90 day supply    E-Prescribing Status: Receipt confirmed by pharmacy (11/30/2015 7:53 AM EST)     Pharmacy    [facility] [place] [facility] [place] [address]

## 2015-12-01 ENCOUNTER — Ambulatory Visit (INDEPENDENT_AMBULATORY_CARE_PROVIDER_SITE_OTHER): Payer: Medicaid Other | Admitting: *Deleted

## 2015-12-01 DIAGNOSIS — Z7901 Long term (current) use of anticoagulants: Secondary | ICD-10-CM | POA: Diagnosis not present

## 2015-12-01 DIAGNOSIS — I059 Rheumatic mitral valve disease, unspecified: Secondary | ICD-10-CM

## 2015-12-01 DIAGNOSIS — Z954 Presence of other heart-valve replacement: Secondary | ICD-10-CM | POA: Diagnosis not present

## 2015-12-01 DIAGNOSIS — Z952 Presence of prosthetic heart valve: Secondary | ICD-10-CM

## 2015-12-01 LAB — POCT INR: INR: 1

## 2015-12-09 ENCOUNTER — Ambulatory Visit (INDEPENDENT_AMBULATORY_CARE_PROVIDER_SITE_OTHER): Payer: Self-pay | Admitting: Pharmacist

## 2015-12-09 DIAGNOSIS — I059 Rheumatic mitral valve disease, unspecified: Secondary | ICD-10-CM

## 2015-12-09 DIAGNOSIS — Z7901 Long term (current) use of anticoagulants: Secondary | ICD-10-CM

## 2015-12-09 DIAGNOSIS — Z952 Presence of prosthetic heart valve: Secondary | ICD-10-CM

## 2015-12-09 DIAGNOSIS — Z954 Presence of other heart-valve replacement: Secondary | ICD-10-CM | POA: Diagnosis not present

## 2015-12-09 LAB — POCT INR: INR: 2.4

## 2015-12-15 ENCOUNTER — Encounter (HOSPITAL_COMMUNITY): Payer: Self-pay | Admitting: Neurology

## 2015-12-15 ENCOUNTER — Emergency Department (HOSPITAL_COMMUNITY)
Admission: EM | Admit: 2015-12-15 | Discharge: 2015-12-15 | Disposition: A | Discharge status: Home / Self Care | Payer: Medicaid Other | Attending: Emergency Medicine | Admitting: Emergency Medicine

## 2015-12-15 DIAGNOSIS — Z7901 Long term (current) use of anticoagulants: Secondary | ICD-10-CM | POA: Insufficient documentation

## 2015-12-15 DIAGNOSIS — R109 Unspecified abdominal pain: Secondary | ICD-10-CM | POA: Insufficient documentation

## 2015-12-15 DIAGNOSIS — Z9889 Other specified postprocedural states: Secondary | ICD-10-CM | POA: Insufficient documentation

## 2015-12-15 DIAGNOSIS — J45909 Unspecified asthma, uncomplicated: Secondary | ICD-10-CM | POA: Diagnosis not present

## 2015-12-15 DIAGNOSIS — I5031 Acute diastolic (congestive) heart failure: Secondary | ICD-10-CM | POA: Insufficient documentation

## 2015-12-15 DIAGNOSIS — Z79899 Other long term (current) drug therapy: Secondary | ICD-10-CM | POA: Insufficient documentation

## 2015-12-15 DIAGNOSIS — R319 Hematuria, unspecified: Secondary | ICD-10-CM

## 2015-12-15 DIAGNOSIS — D6859 Other primary thrombophilia: Secondary | ICD-10-CM | POA: Insufficient documentation

## 2015-12-15 DIAGNOSIS — K219 Gastro-esophageal reflux disease without esophagitis: Secondary | ICD-10-CM | POA: Insufficient documentation

## 2015-12-15 DIAGNOSIS — Z7982 Long term (current) use of aspirin: Secondary | ICD-10-CM | POA: Insufficient documentation

## 2015-12-15 DIAGNOSIS — I10 Essential (primary) hypertension: Secondary | ICD-10-CM | POA: Insufficient documentation

## 2015-12-15 DIAGNOSIS — Z87891 Personal history of nicotine dependence: Secondary | ICD-10-CM | POA: Diagnosis not present

## 2015-12-15 LAB — CBC
HCT: 41.1 % (ref 36.0–46.0)
Hemoglobin: 14.3 g/dL (ref 12.0–15.0)
MCH: 34.8 pg — AB (ref 26.0–34.0)
MCHC: 34.8 g/dL (ref 30.0–36.0)
MCV: 100 fL (ref 78.0–100.0)
PLATELETS: 376 10*3/uL (ref 150–400)
RBC: 4.11 MIL/uL (ref 3.87–5.11)
RDW: 13.4 % (ref 11.5–15.5)
WBC: 9.1 10*3/uL (ref 4.0–10.5)

## 2015-12-15 LAB — URINALYSIS, ROUTINE W REFLEX MICROSCOPIC
BILIRUBIN URINE: NEGATIVE
GLUCOSE, UA: NEGATIVE mg/dL
Ketones, ur: 15 mg/dL — AB
NITRITE: NEGATIVE
PH: 5.5 (ref 5.0–8.0)
Protein, ur: >300 mg/dL — AB
SPECIFIC GRAVITY, URINE: 1.01 (ref 1.005–1.030)

## 2015-12-15 LAB — BASIC METABOLIC PANEL
Anion gap: 10 (ref 5–15)
BUN: 7 mg/dL (ref 6–20)
CHLORIDE: 102 mmol/L (ref 101–111)
CO2: 25 mmol/L (ref 22–32)
CREATININE: 1.2 mg/dL — AB (ref 0.44–1.00)
Calcium: 9.2 mg/dL (ref 8.9–10.3)
GFR calc non Af Amer: 51 mL/min — ABNORMAL LOW (ref >60)
GFR, EST AFRICAN AMERICAN: 59 mL/min — AB (ref >60)
Glucose, Bld: 110 mg/dL — ABNORMAL HIGH (ref 65–99)
Potassium: 4.4 mmol/L (ref 3.5–5.1)
Sodium: 137 mmol/L (ref 135–145)

## 2015-12-15 LAB — URINE MICROSCOPIC-ADD ON

## 2015-12-15 LAB — PROTIME-INR
INR: 4.76 — ABNORMAL HIGH (ref 0.00–1.49)
Prothrombin Time: 46.2 seconds — ABNORMAL HIGH (ref 11.6–15.2)

## 2015-12-15 NOTE — Discharge Instructions (Signed)
Today you were evaluated in the emergency department for blood in your urine.  This is called hematuria was noted on your blood work that your INR is 4.74 which is high , it is recommended that you do not take your Coumadin on Wednesday and Thursday and you have your INR rechecked on Friday. Please call your physician in the morning to report your increased INR make arrangements for a redraw on Friday.   Return anytime you have any of the following symptoms, weakness, rapid heart rate, shortness of breath with exertion, worsening bleeding in your urine, bruising, bleeding from your gums.  Hematuria, Adult Hematuria is blood in your urine. It can be caused by a bladder infection, kidney infection, prostate infection, kidney stone, or cancer of your urinary tract. Infections can usually be treated with medicine, and a kidney stone usually will pass through your urine. If neither of these is the cause of your hematuria, further workup to find out the reason may be needed. It is very important that you tell your health care provider about any blood you see in your urine, even if the blood stops without treatment or happens without causing pain. Blood in your urine that happens and then stops and then happens again can be a symptom of a very serious condition. Also, pain is not a symptom in the initial stages of many urinary cancers. HOME CARE INSTRUCTIONS   Drink lots of fluid, 3-4 quarts a day. If you have been diagnosed with an infection, cranberry juice is especially recommended, in addition to large amounts of water.  Avoid caffeine, tea, and carbonated beverages because they tend to irritate the bladder.  Avoid alcohol because it may irritate the prostate.  Take all medicines as directed by your health care provider.  If you were prescribed an antibiotic medicine, finish it all even if you start to feel better.  If you have been diagnosed with a kidney stone, follow your health care provider's  instructions regarding straining your urine to catch the stone.  Empty your bladder often. Avoid holding urine for long periods of time.  After a bowel movement, women should cleanse front to back. Use each tissue only once.  Empty your bladder before and after sexual intercourse if you are a female. SEEK MEDICAL CARE IF:  You develop back pain.  You have a fever.  You have a feeling of sickness in your stomach (nausea) or vomiting.  Your symptoms are not better in 3 days. Return sooner if you are getting worse. SEEK IMMEDIATE MEDICAL CARE IF:   You develop severe vomiting and are unable to keep the medicine down.  You develop severe back or abdominal pain despite taking your medicines.  You begin passing a large amount of blood or clots in your urine.  You feel extremely weak or faint, or you pass out. MAKE SURE YOU:   Understand these instructions.  Will watch your condition.  Will get help right away if you are not doing well or get worse.   This information is not intended to replace advice given to you by your health care provider. Make sure you discuss any questions you have with your health care provider.   Document Released: 09/12/2005 Document Revised: 10/03/2014 Document Reviewed: 05/13/2013 Elsevier Interactive Patient Education [employer].

## 2015-12-15 NOTE — ED Provider Notes (Signed)
CSN: [id number]     Arrival date & time 12/15/15  1353 History   First MD Initiated Contact with Patient 12/15/15 2041     Chief Complaint  Patient presents with  . Hematuria     (Consider location/radiation/quality/duration/timing/severity/associated sxs/prior Treatment) HPI Comments: This 63 female with a history of mitral valve and aortic valve replacements who was on chronic Coumadin who noticed yesterday that she had blood in her urine.  This is a been persistent with each voiding.  Denies any weakness, tachycardia, bruising, bleeding of the gums, dizziness, chest pain, shortness of breath.  She states she had her Coumadin level checked approximately 2 weeks ago and it was normal.  There was no medication change.  She has not had any change in her diet.  Patient is a 55 y.o. female presenting with hematuria. The history is provided by the patient.  Hematuria This is a new problem. The current episode started yesterday. The problem occurs constantly. The problem has been unchanged. Associated symptoms include abdominal pain. Pertinent negatives include no chest pain, fatigue, myalgias, nausea, vomiting or weakness. Nothing aggravates the symptoms. She has tried nothing for the symptoms. The treatment provided no relief.    Past Medical History  Diagnosis Date  . Asthma     "as a baby"   . Anemia   . History of blood transfusion     "14 w/1st pregnancy; 2 w/last C-section" (03/15/2013)  . GERD (gastroesophageal reflux disease)   . Hypertension     no pcp   will go to [facility]  . Severe mitral regurgitation 04/22/2013  . SVT (supraventricular tachycardia) ([place]) 03/16/2013  . Acute diastolic heart failure ([place]) 03/15/2013  . 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 Tricuspid regurgitation   . S/P mitral valve replacement with metallic valve 123XX123    18mm Sorin Carbomedics mechanical prosthesis via right mini thoracotomy approach  . 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 Complete heart block ([place]) 05/28/2013    Post-op  . Epigastric hernia 200's  . Hx of echocardiogram     a. Echo 4/16:  Mild focal basal septal hypertrophy, EF 60-65%, no RWMA, Mechanical MVR ok with mild central regurgitation and no perivalvular leak, small mobile density attached to valvular ring (1.5x1 cm) - post surgical changes vs SBE, mild LAE;   b. Echo 2/17:  Mild post wall LVH, EF 55-60%, no RWMA, mechanical MVR ok (mean 5 mmHg), normal RVSF  . Carotid stenosis     Carotid US 2/17: bilateral ICA 1-39% >> FU prn   Past Surgical History  Procedure Laterality Date  . Appendectomy  ~1978  . Cesarean section  1983; 1999  . Laparoscopic cholecystectomy  2003  . Cesarean section with bilateral tubal ligation  1999  . Tee without cardioversion N/A 03/18/2013    Procedure: TRANSESOPHAGEAL ECHOCARDIOGRAM (TEE);  Surgeon: Larey Dresser, MD;  Location: [place];  Service: Cardiovascular;  Laterality: N/A;  . Cardiac catheterization    . Multiple extractions with alveoloplasty N/A 04/04/2013    Procedure: Extraction of tooth #'s 1,8,9,13,14,15,23,24,25,26 with alveoloplasty and gross debridement of remaining teeth;  Surgeon: Lenn Cal, DDS;  Location: [place];  Service: Oral Surgery;  Laterality: N/A;  . Minimally invasive tricuspid valve repair Right 05/23/2013    Procedure: MINIMALLY INVASIVE TRICUSPID VALVE REPAIR;  Surgeon: Rexene Alberts,  MD;  Location: [facility];  Service: Open Heart Surgery;  Laterality: Right;  . Intraoperative transesophageal echocardiogram N/A 05/23/2013    Procedure: INTRAOPERATIVE TRANSESOPHAGEAL ECHOCARDIOGRAM;  Surgeon: Rexene Alberts, MD;  Location: [place];  Service: Open Heart Surgery;  Laterality: N/A;  . Femoral hernia repair Right 05/23/2013    Procedure: HERNIA REPAIR FEMORAL;   Surgeon: Rexene Alberts, MD;  Location: [place];  Service: Open Heart Surgery;  Laterality: Right;  . Mitral valve replacement N/A 05/23/2013    Procedure: MITRAL VALVE (MV) REPLACEMENT;  Surgeon: Rexene Alberts, MD;  Location: [place];  Service: Open Heart Surgery;  Laterality: N/A;  . Tee without cardioversion N/A 06/17/2013    Procedure: TRANSESOPHAGEAL ECHOCARDIOGRAM (TEE);  Surgeon: Lelon Perla, MD;  Location: [facility] ENDOSCOPY;  Service: Cardiovascular;  Laterality: N/A;  . Left and right heart catheterization with coronary angiogram N/A 03/22/2013    Procedure: LEFT AND RIGHT HEART CATHETERIZATION WITH CORONARY ANGIOGRAM;  Surgeon: Burnell Blanks, MD;  Location: [facility] CATH LAB;  Service: Cardiovascular;  Laterality: N/A;   Family History  Problem Relation Age of Onset  . Sarcoidosis Sister   . Stroke Father   . Heart attack Neg Hx   . Cancer Mother    Social History  Substance Use Topics  . Smoking status: Former Smoker -- 0.50 packs/day for 36 years    Types: Cigarettes    Quit date: 03/15/2013  . Smokeless tobacco: Never Used  . Alcohol Use: 3.6 oz/week    6 Glasses of wine per week     Comment: 03/15/2013 "bottle of wine/wk"   OB History    No data available     Review of Systems  Constitutional: Negative for fatigue.  Respiratory: Negative for shortness of breath.   Cardiovascular: Negative for chest pain and palpitations.  Gastrointestinal: Positive for abdominal pain. Negative for nausea, vomiting and blood in stool.  Genitourinary: Positive for hematuria. Negative for dysuria and frequency.  Musculoskeletal: Negative for myalgias.  Skin: Negative for pallor.  Neurological: Negative for weakness.  All other systems reviewed and are negative.     Allergies  Oxycodone  Home Medications   Prior to Admission medications   Medication Sig Start Date End Date Taking? Authorizing Provider  acetaminophen (TYLENOL) 500 MG tablet Take 500 mg by mouth every 6 (six)  hours as needed for mild pain or moderate pain.    Historical Provider, MD  aspirin EC 81 MG tablet Take 1 tablet (81 mg total) by mouth daily. 08/26/13   Larey Dresser, MD  ranitidine (ZANTAC) 150 MG capsule Take 1 capsule (150 mg total) by mouth daily. 07/31/15   Milton Ferguson, MD  warfarin (COUMADIN) 5 MG tablet TAKE ONE TABLET BY MOUTH ONCE DAILY AS DIRECTED 11/30/15   Evans Lance, MD  [facility] HAZEL EX Apply 1 application topically at bedtime as needed (knee pain).     Historical Provider, MD   BP 110/77 mmHg  Pulse 108  Temp(Src) 97.9 F (36.6 C) (Oral)  Resp 20  SpO2 99%  LMP 01/28/2013 Physical Exam  Constitutional: She is oriented to person, place, and time. She appears well-developed and well-nourished.  HENT:  Head: Normocephalic.  Eyes: Conjunctivae are normal. Pupils are equal, round, and reactive to light.  Neck: Normal range of motion.  Cardiovascular: Normal rate and regular rhythm.   Pulmonary/Chest: Effort normal.  Abdominal: Soft.  Musculoskeletal: Normal range of motion.  Neurological: She is alert and oriented to person, place, and  time.  Skin: Skin is warm. No pallor.  Nursing note and vitals reviewed.   ED Course  Procedures (including critical care time) Labs Review Labs Reviewed  URINALYSIS, ROUTINE W REFLEX MICROSCOPIC (NOT AT [facility]) - Abnormal; Notable for the following:    Color, Urine RED (*)    APPearance CLOUDY (*)    Hgb urine dipstick LARGE (*)    Ketones, ur 15 (*)    Protein, ur >300 (*)    Leukocytes, UA MODERATE (*)    All other components within normal limits  BASIC METABOLIC PANEL - Abnormal; Notable for the following:    Glucose, Bld 110 (*)    Creatinine, Ser 1.20 (*)    GFR calc non Af Amer 51 (*)    GFR calc Af Amer 59 (*)    All other components within normal limits  CBC - Abnormal; Notable for the following:    MCH 34.8 (*)    All other components within normal limits  PROTIME-INR - Abnormal; Notable for the following:     Prothrombin Time 46.2 (*)    INR 4.76 (*)    All other components within normal limits  URINE MICROSCOPIC-ADD ON - Abnormal; Notable for the following:    Squamous Epithelial / LPF 6-30 (*)    Bacteria, UA FEW (*)    Casts GRANULAR CAST (*)    All other components within normal limits    Imaging Review No results found. I have personally reviewed and evaluated these images and lab results as part of my medical decision-making.   EKG Interpretation None     Discussed the patient's last finding with her and recommend that she do not take her Coumadin for Wednesday and Thursday and have her levels checked again on Friday.  She is to contact her physician tomorrow, letting him know of her abnormal findings today.  She's been cautioned to return at any time if she feels weak, tachycardic, dizzy, or the blood in the urine is worsening MDM   Final diagnoses:  Hypercoagulable state ([place])  Painless hematuria         Junius Creamer, NP 12/15/15 2111  Tanna Furry, MD 12/23/15 [id number]

## 2015-12-15 NOTE — ED Notes (Signed)
Pt reports since yesterday, had hematuria denies abd pain or cramping. LMP 2014, does not think it is vaginal bleeding. Reports only blood when she is urinating. Pt does take coumadin, aspirin. Pt is a x 4. In NAD

## 2015-12-15 NOTE — ED Notes (Signed)
Patient called for vital sign recheck in main ED waiting area with no response 

## 2015-12-18 ENCOUNTER — Ambulatory Visit (INDEPENDENT_AMBULATORY_CARE_PROVIDER_SITE_OTHER): Payer: Medicaid Other | Admitting: *Deleted

## 2015-12-18 DIAGNOSIS — I059 Rheumatic mitral valve disease, unspecified: Secondary | ICD-10-CM

## 2015-12-18 DIAGNOSIS — Z7901 Long term (current) use of anticoagulants: Secondary | ICD-10-CM | POA: Diagnosis not present

## 2015-12-18 DIAGNOSIS — Z954 Presence of other heart-valve replacement: Secondary | ICD-10-CM

## 2015-12-18 DIAGNOSIS — Z952 Presence of prosthetic heart valve: Secondary | ICD-10-CM

## 2015-12-18 LAB — POCT INR
INR: 1.9
INR: 1.9

## 2015-12-28 ENCOUNTER — Ambulatory Visit (INDEPENDENT_AMBULATORY_CARE_PROVIDER_SITE_OTHER): Payer: Medicaid Other | Admitting: Pharmacist

## 2015-12-28 DIAGNOSIS — Z7901 Long term (current) use of anticoagulants: Secondary | ICD-10-CM

## 2015-12-28 DIAGNOSIS — I059 Rheumatic mitral valve disease, unspecified: Secondary | ICD-10-CM

## 2015-12-28 DIAGNOSIS — Z952 Presence of prosthetic heart valve: Secondary | ICD-10-CM

## 2015-12-28 DIAGNOSIS — Z954 Presence of other heart-valve replacement: Secondary | ICD-10-CM | POA: Diagnosis not present

## 2015-12-28 LAB — POCT INR: INR: 2.8

## 2016-01-10 ENCOUNTER — Encounter (HOSPITAL_COMMUNITY): Payer: Self-pay | Admitting: *Deleted

## 2016-01-10 ENCOUNTER — Emergency Department (HOSPITAL_COMMUNITY): Payer: Medicaid Other

## 2016-01-10 ENCOUNTER — Observation Stay (HOSPITAL_COMMUNITY): Payer: Medicaid Other

## 2016-01-10 ENCOUNTER — Inpatient Hospital Stay (HOSPITAL_COMMUNITY)
Admission: EM | Admit: 2016-01-10 | Discharge: 2016-01-13 | DRG: 684 | Disposition: A | Discharge status: Home / Self Care | Payer: Medicaid Other | Attending: Internal Medicine | Admitting: Internal Medicine

## 2016-01-10 DIAGNOSIS — I4581 Long QT syndrome: Secondary | ICD-10-CM | POA: Diagnosis present

## 2016-01-10 DIAGNOSIS — Z952 Presence of prosthetic heart valve: Secondary | ICD-10-CM

## 2016-01-10 DIAGNOSIS — J069 Acute upper respiratory infection, unspecified: Secondary | ICD-10-CM

## 2016-01-10 DIAGNOSIS — R112 Nausea with vomiting, unspecified: Secondary | ICD-10-CM | POA: Diagnosis present

## 2016-01-10 DIAGNOSIS — R791 Abnormal coagulation profile: Secondary | ICD-10-CM | POA: Diagnosis present

## 2016-01-10 DIAGNOSIS — I10 Essential (primary) hypertension: Secondary | ICD-10-CM | POA: Diagnosis present

## 2016-01-10 DIAGNOSIS — R6889 Other general symptoms and signs: Secondary | ICD-10-CM | POA: Diagnosis present

## 2016-01-10 DIAGNOSIS — N179 Acute kidney failure, unspecified: Principal | ICD-10-CM | POA: Diagnosis present

## 2016-01-10 DIAGNOSIS — J111 Influenza due to unidentified influenza virus with other respiratory manifestations: Secondary | ICD-10-CM | POA: Diagnosis present

## 2016-01-10 DIAGNOSIS — Z87891 Personal history of nicotine dependence: Secondary | ICD-10-CM

## 2016-01-10 DIAGNOSIS — I6529 Occlusion and stenosis of unspecified carotid artery: Secondary | ICD-10-CM | POA: Diagnosis present

## 2016-01-10 DIAGNOSIS — K219 Gastro-esophageal reflux disease without esophagitis: Secondary | ICD-10-CM | POA: Diagnosis present

## 2016-01-10 DIAGNOSIS — Y92238 Other place in hospital as the place of occurrence of the external cause: Secondary | ICD-10-CM | POA: Diagnosis present

## 2016-01-10 DIAGNOSIS — R1110 Vomiting, unspecified: Secondary | ICD-10-CM

## 2016-01-10 DIAGNOSIS — T450X5A Adverse effect of antiallergic and antiemetic drugs, initial encounter: Secondary | ICD-10-CM | POA: Diagnosis present

## 2016-01-10 DIAGNOSIS — L299 Pruritus, unspecified: Secondary | ICD-10-CM | POA: Diagnosis present

## 2016-01-10 DIAGNOSIS — R319 Hematuria, unspecified: Secondary | ICD-10-CM | POA: Diagnosis present

## 2016-01-10 DIAGNOSIS — Z7901 Long term (current) use of anticoagulants: Secondary | ICD-10-CM

## 2016-01-10 DIAGNOSIS — R197 Diarrhea, unspecified: Secondary | ICD-10-CM | POA: Diagnosis not present

## 2016-01-10 DIAGNOSIS — Z954 Presence of other heart-valve replacement: Secondary | ICD-10-CM

## 2016-01-10 DIAGNOSIS — Z885 Allergy status to narcotic agent status: Secondary | ICD-10-CM

## 2016-01-10 DIAGNOSIS — R3121 Asymptomatic microscopic hematuria: Secondary | ICD-10-CM | POA: Diagnosis present

## 2016-01-10 LAB — PROTIME-INR
INR: 1.36 (ref 0.00–1.49)
Prothrombin Time: 16.9 seconds — ABNORMAL HIGH (ref 11.6–15.2)

## 2016-01-10 LAB — SODIUM, URINE, RANDOM: SODIUM UR: 65 mmol/L

## 2016-01-10 LAB — COMPREHENSIVE METABOLIC PANEL
ALT: 29 U/L (ref 14–54)
AST: 87 U/L — AB (ref 15–41)
Albumin: 3 g/dL — ABNORMAL LOW (ref 3.5–5.0)
Alkaline Phosphatase: 98 U/L (ref 38–126)
Anion gap: 13 (ref 5–15)
BILIRUBIN TOTAL: 0.3 mg/dL (ref 0.3–1.2)
BUN: 13 mg/dL (ref 6–20)
CO2: 20 mmol/L — ABNORMAL LOW (ref 22–32)
CREATININE: 2.34 mg/dL — AB (ref 0.44–1.00)
Calcium: 8.6 mg/dL — ABNORMAL LOW (ref 8.9–10.3)
Chloride: 103 mmol/L (ref 101–111)
GFR, EST AFRICAN AMERICAN: 26 mL/min — AB (ref >60)
GFR, EST NON AFRICAN AMERICAN: 23 mL/min — AB (ref >60)
Glucose, Bld: 116 mg/dL — ABNORMAL HIGH (ref 65–99)
POTASSIUM: 3.8 mmol/L (ref 3.5–5.1)
Sodium: 136 mmol/L (ref 135–145)
TOTAL PROTEIN: 6.9 g/dL (ref 6.5–8.1)

## 2016-01-10 LAB — CBC
HCT: 43 % (ref 36.0–46.0)
Hemoglobin: 14.7 g/dL (ref 12.0–15.0)
MCH: 33.6 pg (ref 26.0–34.0)
MCHC: 34.2 g/dL (ref 30.0–36.0)
MCV: 98.4 fL (ref 78.0–100.0)
PLATELETS: 283 10*3/uL (ref 150–400)
RBC: 4.37 MIL/uL (ref 3.87–5.11)
RDW: 13.3 % (ref 11.5–15.5)
WBC: 3.2 10*3/uL — AB (ref 4.0–10.5)

## 2016-01-10 LAB — I-STAT CG4 LACTIC ACID, ED: LACTIC ACID, VENOUS: 0.61 mmol/L (ref 0.5–2.0)

## 2016-01-10 LAB — URINALYSIS, ROUTINE W REFLEX MICROSCOPIC
BILIRUBIN URINE: NEGATIVE
BILIRUBIN URINE: NEGATIVE
Glucose, UA: NEGATIVE mg/dL
Glucose, UA: NEGATIVE mg/dL
KETONES UR: 15 mg/dL — AB
KETONES UR: 15 mg/dL — AB
Leukocytes, UA: NEGATIVE
Leukocytes, UA: NEGATIVE
NITRITE: NEGATIVE
NITRITE: NEGATIVE
PH: 5.5 (ref 5.0–8.0)
PH: 5.5 (ref 5.0–8.0)
Protein, ur: >300 mg/dL — AB
Specific Gravity, Urine: 1.038 — ABNORMAL HIGH (ref 1.005–1.030)
Specific Gravity, Urine: 1.021 (ref 1.005–1.030)

## 2016-01-10 LAB — TSH: TSH: 1.725 u[IU]/mL (ref 0.350–4.500)

## 2016-01-10 LAB — INFLUENZA PANEL BY PCR (TYPE A & B)
H1N1 flu by pcr: NOT DETECTED
INFLBPCR: POSITIVE — AB
Influenza A By PCR: NEGATIVE

## 2016-01-10 LAB — URINE MICROSCOPIC-ADD ON

## 2016-01-10 LAB — PHOSPHORUS: Phosphorus: 4.2 mg/dL (ref 2.5–4.6)

## 2016-01-10 LAB — MAGNESIUM: MAGNESIUM: 1.5 mg/dL — AB (ref 1.7–2.4)

## 2016-01-10 LAB — CREATININE, URINE, RANDOM: CREATININE, URINE: 197.42 mg/dL

## 2016-01-10 MED ORDER — WARFARIN - PHARMACIST DOSING INPATIENT
Freq: Every day | Status: DC
  Administered 2016-01-12: 18:00:00

## 2016-01-10 MED ORDER — ENOXAPARIN SODIUM 60 MG/0.6ML ~~LOC~~ SOLN
60.0000 mg | SUBCUTANEOUS | Status: DC
  Administered 2016-01-10 – 2016-01-11 (×2): 60 mg via SUBCUTANEOUS
  Filled 2016-01-10 (×3): qty 0.6

## 2016-01-10 MED ORDER — SODIUM CHLORIDE 0.9 % IV SOLN
INTRAVENOUS | Status: AC
  Administered 2016-01-10: 09:00:00 via INTRAVENOUS

## 2016-01-10 MED ORDER — SODIUM CHLORIDE 0.9 % IV BOLUS (SEPSIS)
1000.0000 mL | Freq: Once | INTRAVENOUS | Status: AC
  Administered 2016-01-10: 1000 mL via INTRAVENOUS

## 2016-01-10 MED ORDER — DIPHENHYDRAMINE HCL 50 MG/ML IJ SOLN
25.0000 mg | Freq: Once | INTRAMUSCULAR | Status: AC
  Administered 2016-01-10: 25 mg via INTRAVENOUS
  Filled 2016-01-10: qty 1

## 2016-01-10 MED ORDER — ONDANSETRON HCL 4 MG/2ML IJ SOLN: 4.0000 mg | Freq: Four times a day (QID) | INTRAMUSCULAR | Status: DC | PRN

## 2016-01-10 MED ORDER — OSELTAMIVIR PHOSPHATE 30 MG PO CAPS
30.0000 mg | ORAL_CAPSULE | Freq: Every day | ORAL | Status: DC
  Administered 2016-01-10 – 2016-01-11 (×2): 30 mg via ORAL
  Filled 2016-01-10 (×2): qty 1

## 2016-01-10 MED ORDER — ALBUTEROL SULFATE (2.5 MG/3ML) 0.083% IN NEBU
2.5000 mg | INHALATION_SOLUTION | Freq: Four times a day (QID) | RESPIRATORY_TRACT | Status: DC
  Administered 2016-01-10 (×3): 2.5 mg via RESPIRATORY_TRACT
  Filled 2016-01-10 (×3): qty 3

## 2016-01-10 MED ORDER — GUAIFENESIN-CODEINE 100-10 MG/5ML PO SOLN
10.0000 mL | Freq: Once | ORAL | Status: AC
  Administered 2016-01-10: 10 mL via ORAL
  Filled 2016-01-10: qty 10

## 2016-01-10 MED ORDER — SODIUM CHLORIDE 0.9 % IV BOLUS (SEPSIS)
1000.0000 mL | Freq: Once | INTRAVENOUS | Status: AC
Start: 2016-01-10 — End: 2016-01-10
  Administered 2016-01-10: 1000 mL via INTRAVENOUS

## 2016-01-10 MED ORDER — GUAIFENESIN ER 600 MG PO TB12
600.0000 mg | ORAL_TABLET | Freq: Two times a day (BID) | ORAL | Status: DC
  Administered 2016-01-10 – 2016-01-13 (×7): 600 mg via ORAL
  Filled 2016-01-10 (×7): qty 1

## 2016-01-10 MED ORDER — SODIUM CHLORIDE 0.9 % IV SOLN: INTRAVENOUS | Status: DC

## 2016-01-10 MED ORDER — ONDANSETRON HCL 4 MG PO TABS: 4.0000 mg | ORAL_TABLET | Freq: Four times a day (QID) | ORAL | Status: DC | PRN

## 2016-01-10 MED ORDER — OSELTAMIVIR PHOSPHATE 75 MG PO CAPS: 75.0000 mg | ORAL_CAPSULE | Freq: Every day | ORAL | Status: DC

## 2016-01-10 MED ORDER — TRAZODONE HCL 50 MG PO TABS: 25.0000 mg | ORAL_TABLET | Freq: Every evening | ORAL | Status: DC | PRN

## 2016-01-10 MED ORDER — WARFARIN SODIUM 7.5 MG PO TABS
7.5000 mg | ORAL_TABLET | Freq: Once | ORAL | Status: AC
  Administered 2016-01-10: 7.5 mg via ORAL
  Filled 2016-01-10: qty 1

## 2016-01-10 MED ORDER — ACETAMINOPHEN 650 MG RE SUPP: 650.0000 mg | Freq: Four times a day (QID) | RECTAL | Status: DC | PRN

## 2016-01-10 MED ORDER — ALBUTEROL SULFATE (2.5 MG/3ML) 0.083% IN NEBU: 2.5000 mg | INHALATION_SOLUTION | Freq: Four times a day (QID) | RESPIRATORY_TRACT | Status: DC | PRN

## 2016-01-10 MED ORDER — MAGNESIUM SULFATE 2 GM/50ML IV SOLN
2.0000 g | Freq: Once | INTRAVENOUS | Status: AC
  Administered 2016-01-10: 2 g via INTRAVENOUS
  Filled 2016-01-10: qty 50

## 2016-01-10 MED ORDER — ACETAMINOPHEN 500 MG PO TABS
1000.0000 mg | ORAL_TABLET | Freq: Once | ORAL | Status: AC
  Administered 2016-01-10: 1000 mg via ORAL
  Filled 2016-01-10: qty 2

## 2016-01-10 MED ORDER — ASPIRIN EC 81 MG PO TBEC
81.0000 mg | DELAYED_RELEASE_TABLET | Freq: Every day | ORAL | Status: DC
Start: 2016-01-10 — End: 2016-01-13
  Administered 2016-01-11 – 2016-01-13 (×3): 81 mg via ORAL
  Filled 2016-01-10 (×3): qty 1

## 2016-01-10 MED ORDER — POLYETHYLENE GLYCOL 3350 17 G PO PACK: 17.0000 g | PACK | Freq: Every day | ORAL | Status: DC | PRN

## 2016-01-10 MED ORDER — ONDANSETRON HCL 4 MG/2ML IJ SOLN
4.0000 mg | Freq: Once | INTRAMUSCULAR | Status: AC
  Administered 2016-01-10: 4 mg via INTRAVENOUS
  Filled 2016-01-10: qty 2

## 2016-01-10 MED ORDER — ACETAMINOPHEN 325 MG PO TABS
650.0000 mg | ORAL_TABLET | Freq: Four times a day (QID) | ORAL | Status: DC | PRN
  Administered 2016-01-10 – 2016-01-13 (×4): 650 mg via ORAL
  Filled 2016-01-10 (×4): qty 2

## 2016-01-10 NOTE — ED Notes (Signed)
Pt taken to xray 

## 2016-01-10 NOTE — Progress Notes (Signed)
[facility] patient  Mary [name] is a 55 year old female with a past medical history significant for mitral valve replacement, tricuspid valve repair, on chronic anticoagulation who presents with flulike symptoms with fever up to 102F, nausea, vomiting, and diarrhea. Influenza screen pending. Patient given Tylenol and started on IV fluids. Admitted for observation to a telemetry bed. INR noted to be subtherapeutic 1.36.

## 2016-01-10 NOTE — ED Notes (Signed)
Pt c/o flu like symptoms since Thursday with NV and generalized abd pain. On EMS arrival, pt's bp 90 palpated, unable to palpate radials and vomiting. 380ml NS and 4mg  zofran given. Pt's bp improved with fluids.

## 2016-01-10 NOTE — H&P (Signed)
[facility] History and Physical  Mary [name] [id number] DOB: [birth year] DOA: 01/10/2016  Referring physician: danford PCP: Loralie Champagne, MD   Chief Complaint: persistent flu like symptoms  HPI: Mary [name] is a pleasant 55 y.o. female past medical history that includes rheumatic mitral stenosis with regard status post mitral valve replacement and tricuspid valve repair, carotid stenosis, hypertension, since to the emergency department with persistent flulike symptoms. Initial evaluation in the emergency department reveals subtherapeutic INR of 1.36 as well as acute kidney injury.  Information is obtained from the patient. She reports 4 day history flulike symptoms specifically nausea vomiting diarrhea upper respiratory congestion productive cough myalgia. She reports a max temperature of 102 2 days ago. She denies chest pain palpitation, shortness of breath, headache dizziness syncope or near-syncope. She denies abdominal pain dysuria hematuria frequency or urgency. She denies lower extremity edema or orthopnea. She denies any recent travel or sick contacts. In addition she denies having a PCP. She sees Dr. Algernon Huxley cardiology goes to Coumadin clinic  In the emergency department she's afebrile hemodynamically stable and not hypoxic. He was provided with Tylenol and IV fluids in the emergency department.  Review of Systems:  10 point review of systems complete and all systems are negative except as indicated in the history of present illness  Past Medical History  Diagnosis Date  . Asthma     "as a baby"   . Anemia   . History of blood transfusion     "14 w/1st pregnancy; 2 w/last C-section" (03/15/2013)  . GERD (gastroesophageal reflux disease)   . Hypertension     no pcp   will go to [facility]  . Severe mitral regurgitation 04/22/2013  . SVT (supraventricular tachycardia) ([place]) 03/16/2013  . Acute diastolic heart failure ([place]) 03/15/2013  . Rheumatic mitral stenosis with  regurgitation 03/23/2013  . Tricuspid regurgitation   . S/P mitral valve replacement with metallic valve 123XX123    17mm Sorin Carbomedics mechanical prosthesis via right mini thoracotomy approach  . 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    Complex valvuloplasty including Cor-matrix ECM patch augmentation of anterior and lateral leaflets with 63mm Edwards mc3 ring annuloplasty via right mini-thoracotomy approach  . Complete heart block ([place]) 05/28/2013    Post-op  . Epigastric hernia 200's  . Hx of echocardiogram     a. Echo 4/16:  Mild focal basal septal hypertrophy, EF 60-65%, no RWMA, Mechanical MVR ok with mild central regurgitation and no perivalvular leak, small mobile density attached to valvular ring (1.5x1 cm) - post surgical changes vs SBE, mild LAE;   b. Echo 2/17:  Mild post wall LVH, EF 55-60%, no RWMA, mechanical MVR ok (mean 5 mmHg), normal RVSF  . Carotid stenosis     Carotid US 2/17: bilateral ICA 1-39% >> FU prn   Past Surgical History  Procedure Laterality Date  . Appendectomy  ~1978  . Cesarean section  1983; 1999  . Laparoscopic cholecystectomy  2003  . Cesarean section with bilateral tubal ligation  1999  . Tee without cardioversion N/A 03/18/2013    Procedure: TRANSESOPHAGEAL ECHOCARDIOGRAM (TEE);  Surgeon: Larey Dresser, MD;  Location: [place];  Service: Cardiovascular;  Laterality: N/A;  . Cardiac catheterization    . Multiple extractions with alveoloplasty N/A 04/04/2013    Procedure: Extraction of tooth #'s 1,8,9,13,14,15,23,24,25,26 with alveoloplasty and gross debridement of remaining teeth;  Surgeon: Lenn Cal, DDS;  Location: [place];  Service: Oral Surgery;  Laterality: N/A;  . Minimally invasive  tricuspid valve repair Right 05/23/2013    Procedure: MINIMALLY INVASIVE TRICUSPID VALVE REPAIR;  Surgeon: Rexene Alberts, MD;  Location: [place];  Service: Open Heart Surgery;  Laterality: Right;  . Intraoperative transesophageal echocardiogram N/A 05/23/2013     Procedure: INTRAOPERATIVE TRANSESOPHAGEAL ECHOCARDIOGRAM;  Surgeon: Rexene Alberts, MD;  Location: [place];  Service: Open Heart Surgery;  Laterality: N/A;  . Femoral hernia repair Right 05/23/2013    Procedure: HERNIA REPAIR FEMORAL;  Surgeon: Rexene Alberts, MD;  Location: [place];  Service: Open Heart Surgery;  Laterality: Right;  . Mitral valve replacement N/A 05/23/2013    Procedure: MITRAL VALVE (MV) REPLACEMENT;  Surgeon: Rexene Alberts, MD;  Location: [place];  Service: Open Heart Surgery;  Laterality: N/A;  . Tee without cardioversion N/A 06/17/2013    Procedure: TRANSESOPHAGEAL ECHOCARDIOGRAM (TEE);  Surgeon: Lelon Perla, MD;  [facility] ENDOSCOPY;  Service: Cardiovascular;  Laterality: N/A;  . Left and right heart catheterization with coronary angiogram N/A 03/22/2013    Procedure: LEFT AND RIGHT HEART CATHETERIZATION WITH CORONARY ANGIOGRAM;  Surgeon: Burnell Blanks, MD;  Location: [facility] CATH LAB;  Service: Cardiovascular;  Laterality: N/A;   Social History:  reports that she quit smoking about 2 years ago. Her smoking use included Cigarettes. She has a 18 pack-year smoking history. She has never used smokeless tobacco. She reports that she drinks about 3.6 oz of alcohol per week. She reports that she does not use illicit drugs.  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 Oxycodone Itching    Family History  Problem Relation Age of Onset  . Sarcoidosis Sister   . Stroke Father   . Heart attack Neg Hx   . Cancer Mother     Prior to Admission medications   Medication Sig Start Date End Date Taking? Authorizing Provider  acetaminophen (TYLENOL) 500 MG tablet Take 500 mg by mouth every 6 (six) hours as needed for mild pain or moderate pain.    Historical Provider, MD  aspirin EC 81 MG tablet Take 1 tablet (81 mg total) by mouth daily. 08/26/13   Larey Dresser, MD  ranitidine (ZANTAC) 150 MG capsule Take 1  capsule (150 mg total) by mouth daily. 07/31/15   Milton Ferguson, MD  warfarin (COUMADIN) 5 MG tablet TAKE ONE TABLET BY MOUTH ONCE DAILY AS DIRECTED 11/30/15   Evans Lance, MD  [facility] HAZEL EX Apply 1 application topically at bedtime as needed (knee pain).     Historical Provider, MD   Physical Exam: Filed Vitals:   01/10/16 0600 01/10/16 0648 01/10/16 0650 01/10/16 0824  BP: 102/73  111/68   Pulse: 94  100 95  Temp:   98.4 F (36.9 C)   TempSrc:      Resp: 16  20 20   Height:  5\' 2"  (1.575 m)    Weight:  61.2 kg (134 lb 14.7 oz)    SpO2: 95%  98% 97%    Wt Readings from Last 3 Encounters:  01/10/16 61.2 kg (134 lb 14.7 oz)  10/21/15 63.05 kg (139 lb)  09/13/15 64.5 kg (142 lb 3.2 oz)    General:  Appears calm and comfortable, lately ill appearing Eyes: PERRL, normal lids, irises & conjunctiva ENT: grossly normal hearing, lips & tongue Neck: no LAD, masses or thyromegaly Cardiovascular: RRR, no m/r/g. No LE edema. Pedal pulses present and palpable Respiratory: Normal effort intermittent moist nonproductive  cough during exam breath sounds clear bilateral bases some mild wheezing upper lobes anterior and posterior Abdomen: soft, ntnd is a bowel sounds no guarding or rebounding Skin: no rash or induration seen on limited exam Musculoskeletal: grossly normal tone BUE/BLE Psychiatric: grossly normal mood and affect, speech fluent and appropriate Neurologic: grossly non-focal. Oriented 3 speech clear facial symmetry           Labs on Admission:  Basic Metabolic Panel:  Recent Labs Lab 01/10/16 0201  NA 136  K 3.8  CL 103  CO2 20*  GLUCOSE 116*  BUN 13  CREATININE 2.34*  CALCIUM 8.6*   Liver Function Tests:  Recent Labs Lab 01/10/16 0201  AST 87*  ALT 29  ALKPHOS 98  BILITOT 0.3  PROT 6.9  ALBUMIN 3.0*   No results for input(s): LIPASE, AMYLASE in the last 168 hours. No results for input(s): AMMONIA in the last 168 hours. CBC:  Recent Labs Lab  01/10/16 0201  WBC 3.2*  HGB 14.7  HCT 43.0  MCV 98.4  PLT 283   Cardiac Enzymes: No results for input(s): CKTOTAL, CKMB, CKMBINDEX, TROPONINI in the last 168 hours.  BNP (last 3 results) No results for input(s): BNP in the last 8760 hours.  ProBNP (last 3 results) No results for input(s): PROBNP in the last 8760 hours.  CBG: No results for input(s): GLUCAP in the last 168 hours.  Radiological Exams on Admission: Dg Chest 2 View  01/10/2016  CLINICAL DATA:  Nausea, vomiting, generalized abdominal pain and dyspnea. Four days duration. EXAM: CHEST  2 VIEW COMPARISON:  09/13/2015 FINDINGS: There are cardiac valvuloplasty is. Heart size is normal. There is moderate hyperinflation. The lungs are clear. There is no pleural effusion. The pulmonary vasculature is normal. Hilar and mediastinal contours are unremarkable unchanged. IMPRESSION: Hyperinflation.  No acute cardiopulmonary findings. Electronically Signed   By: Andreas Newport M.D.   On: 01/10/2016 03:21    EKG: Independently reviewed. Sinus tachycardia Biatrial enlargement Borderline left axis deviation Abnormal R-wave progression, early transition Borderline prolonged QT interval No significant change since last tracing  Assessment/Plan Principal Problem:   AKI (acute kidney injury) ([place]) Active Problems:   S/P minimally-invasive mitral valve replacement with mechanical valve and tricuspid valve repair   Nausea and vomiting   Flu-like symptoms   Subtherapeutic international normalized ratio (INR)   Hematuria  #1. Acute kidney injury. Likely related to dehydration in the setting of persistent flulike symptoms. Creatinine 2.34 on admission and within limits of normal 2 months ago. Urinalysis with large Hg. Some concern downward trend related to #2. -Admit to telemetry -Hold nephrotoxins -IV fluids -CT renal  -Monitor urine output -Recheck in the morning  #2. Subtherapeutic INR in patient with mitral valve replacement  and tricuspid valve repair on chronic anticoagulation. Patient reports compliance with Coumadin. May be related to #1. Have an signs about compliance. INR 1.36 on admission -Heparin per pharmacy -Bridge with Lovenox while INR is less than 2.5 -Plan to discontinue Lovenox when INR greater than 2.5  #3. Persistent flulike symptoms/nausea and vomiting. No abdominal pain. Influenza panel pending chest x-ray no acute cardiopulmonary findings -Supportive therapy -Gentle IV fluids -Anti emetic -Nebulizers -Antitussives  4. minimally invasive mitral valve replacement with mechanical valve and tricuspid valve repair. EKG as noted above. Chart review indicates patient history of noncompliance with cardiology follow-up. He denies chest pain -See #2 -Continue  #5. Hematuria. Microscopic. Hx of same 3 months ago. At that time advised to hold coumadin for  2 days. Some concern related to #2 resulting possible emboli to kidney -see #1.  -monitor CBC   Code Status: full DVT Prophylaxis: Family Communication: none present Disposition Plan: home when ready  Time spent: 46 minutes  [facility]

## 2016-01-10 NOTE — Progress Notes (Signed)
ANTICOAGULATION CONSULT NOTE - Initial Consult  Pharmacy Consult for Warfarin (and Lovenox while INR is <2.5) Indication: Mechanical mitral valve  Allergies  Allergen Reactions  . Oxycodone Itching   Vital Signs: Temp: 98.4 F (36.9 C) (04/16 0328) Temp Source: Oral (04/16 0328) BP: 102/73 mmHg (04/16 0600) Pulse Rate: 94 (04/16 0600)  Labs:  Recent Labs  01/10/16 0201  HGB 14.7  HCT 43.0  PLT 283  LABPROT 16.9*  INR 1.36  CREATININE 2.34*    Medical History: Past Medical History  Diagnosis Date  . Asthma     "as a baby"   . Anemia   . History of blood transfusion     "14 w/1st pregnancy; 2 w/last C-section" (03/15/2013)  . GERD (gastroesophageal reflux disease)   . Hypertension     no pcp   will go to [facility]  . Severe mitral regurgitation 04/22/2013  . SVT (supraventricular tachycardia) ([place]) 03/16/2013  . Acute diastolic heart failure ([place]) 03/15/2013  . 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 Tricuspid regurgitation   . S/P mitral valve replacement with metallic valve 123XX123    72mm Sorin Carbomedics mechanical prosthesis via right mini thoracotomy approach  . 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    Complex valvuloplasty including Cor-matrix ECM patch augmentation of anterior and lateral leaflets with 30mm Edwards mc3 ring annuloplasty via right mini-thoracotomy approach  . Complete heart block ([place]) 05/28/2013    Post-op  . Epigastric hernia 200's  . Hx of echocardiogram     a. Echo 4/16:  Mild focal basal septal hypertrophy, EF 60-65%, no RWMA, Mechanical MVR ok with mild central regurgitation and no perivalvular leak, small mobile density attached to valvular ring (1.5x1 cm) - post surgical changes vs SBE, mild LAE;   b. Echo 2/17:  Mild post wall LVH, EF 55-60%, no RWMA, mechanical MVR ok (mean 5 mmHg), normal RVSF  . Carotid stenosis     Carotid US 2/17: bilateral ICA 1-39% >> FU prn   Assessment: 55 y/o F with mechanical MVR on warfarin  as outpatient, here with flu-like symptoms, INR is sub-therapeutic at 1.36 (goal 2.5-3.5), CBC good, noted renal dysfunction. MD wishes to bridge with Lovenox while INR is <2.5.  Outpatient warfarin dose per outpatient anti-coag notes: 7.5 mg Mon/Wed/Fri, 5 mg all other days  Goal of Therapy:  INR 2.5-3.5 Monitor platelets by anticoagulation protocol: Yes   Plan:  -Warfarin 7.5 mg PO x 1 tonight at 1800 -Lovenox 60 mg subcutaneous q24h (will need to increase dose if renal function improves) -Daily PT/INR -Monitor for bleeding -DC Lovenox when INR >2.5  Narda Bonds 01/10/2016,6:19 AM

## 2016-01-10 NOTE — Progress Notes (Signed)
NURSING PROGRESS NOTE  KAYDENSE SCIALDONE [id number] Admission Data: 01/10/2016 7:21 AM Attending Provider: No att. providers found [id number] Aundra Dubin, MD Code Status: FULL   Mary [name] is a 55 y.o. female patient admitted from ED:  -No acute distress noted.  -No complaints of shortness of breath.  -No complaints of chest pain.   Cardiac Monitoring: Box # 23 in place. Cardiac monitor yields:sinus tachycardia.  Blood pressure 111/68, pulse 100, temperature 98.4 F (36.9 C), temperature source Oral, resp. rate 20, height 5\' 2"  (1.575 m), weight 61.2 kg (134 lb 14.7 oz), last menstrual period 01/28/2013, SpO2 98 %.   IV Fluids:  IV in place, occlusive dsg intact without redness, IV cath hand right, condition patent and no redness none.   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  Past Surgical History:   has past surgical history that includes Appendectomy (~1978); Cesarean section [id number]; 1999); Laparoscopic cholecystectomy (2003); Cesarean section with bilateral tubal ligation (1999); TEE without cardioversion (N/A, 03/18/2013); Cardiac catheterization; Multiple extractions with alveoloplasty (N/A, 04/04/2013); Minimally invasive tricuspid valve repair (Right, 05/23/2013); Intraoprative transesophageal echocardiogram (N/A, 05/23/2013); Femoral hernia repair (Right, 05/23/2013); Mitral valve replacement (N/A, 05/23/2013); TEE without  cardioversion (N/A, 06/17/2013); and left and right heart catheterization with coronary angiogram (N/A, 03/22/2013).  Social History:   reports that she quit smoking about 2 years ago. Her smoking use included Cigarettes. She has a 18 pack-year smoking history. She has never used smokeless tobacco. She reports that she drinks about 3.6 oz of alcohol per week. She reports that she does not use illicit drugs.  Skin: Intact  Patient/Family orientated to room. Information packet given to patient/family. Admission inpatient armband information verified with patient/family to include name and date of birth and placed on patient arm. Side rails up x 2, fall assessment and education completed with patient/family. Patient/family able to verbalize understanding of risk associated with falls and verbalized understanding to call for assistance before getting out of bed. Call light within reach. Patient/family able to voice and demonstrate understanding of unit orientation instructions.    Will continue to evaluate and treat per MD orders.  Charolette Child, RN

## 2016-01-10 NOTE — ED Notes (Signed)
Pt with redness and itching to R arm after zofran injection; MD made aware and at bedside

## 2016-01-10 NOTE — ED Provider Notes (Signed)
By signing my name below, I, Doran Stabler, attest that this documentation has been prepared under the direction and in the presence of [employer], DO. Electronically Signed: Doran Stabler, ED Scribe. 01/10/2016. 2:32 AM.  TIME SEEN: 2:32 AM  CHIEF COMPLAINT:  Chief Complaint  Patient presents with  . Influenza  . Emesis    HPI: HPI Comments: Mary [name] is a 55 y.o. female who presents to the Emergency Department with a PMHx of mitral value replacement and tricuspid valve repair On Coumadin complaining of flu-like symptoms that began 2 days ago. Pt reports productive cough, fever Tmax 102, vomiting, diarrhea, and myalgias. Pt is unable to tolerate PO with out vomiting. Pt denies any OTC medication for her symptoms.  Pt denies any sick contacts or recent travels. Denies chest pain or shortness of breath. Denies abdominal pain. Did not have an influenza vaccination. Reports she is coughing up clear sputum.  She reports she does not have a PCP. She is only followed by cardiology, Dr. Aundra Dubin.  ROS: See HPI Constitutional: fever  Eyes: no drainage  ENT: no runny nose   Cardiovascular:  no chest pain  Resp: cough, no SOB  GI: vomiting, diarrhea GU: no dysuria Integumentary: no rash  Allergy: no hives  Musculoskeletal: myalgias, no leg swelling  Neurological: no slurred speech ROS otherwise negative  PAST MEDICAL HISTORY/PAST SURGICAL HISTORY:  Past Medical History  Diagnosis Date  . Asthma     "as a baby"   . Anemia   . History of blood transfusion     "14 w/1st pregnancy; 2 w/last C-section" (03/15/2013)  . GERD (gastroesophageal reflux disease)   . Hypertension     no pcp   will go to [facility]  . Severe mitral regurgitation 04/22/2013  . SVT (supraventricular tachycardia) ([place]) 03/16/2013  . Acute diastolic heart failure ([place]) 03/15/2013  . 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 Tricuspid regurgitation   . S/P mitral valve replacement with metallic  valve 123XX123    81mm Sorin Carbomedics mechanical prosthesis via right mini thoracotomy approach  . 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    Complex valvuloplasty including Cor-matrix ECM patch augmentation of anterior and lateral leaflets with 16mm Edwards mc3 ring annuloplasty via right mini-thoracotomy approach  . Complete heart block ([place]) 05/28/2013    Post-op  . Epigastric hernia 200's  . Hx of echocardiogram     a. Echo 4/16:  Mild focal basal septal hypertrophy, EF 60-65%, no RWMA, Mechanical MVR ok with mild central regurgitation and no perivalvular leak, small mobile density attached to valvular ring (1.5x1 cm) - post surgical changes vs SBE, mild LAE;   b. Echo 2/17:  Mild post wall LVH, EF 55-60%, no RWMA, mechanical MVR ok (mean 5 mmHg), normal RVSF  . Carotid stenosis     Carotid US 2/17: bilateral ICA 1-39% >> FU prn    MEDICATIONS:  Prior to Admission medications   Medication Sig Start Date End Date Taking? Authorizing Provider  acetaminophen (TYLENOL) 500 MG tablet Take 500 mg by mouth every 6 (six) hours as needed for mild pain or moderate pain.    Historical Provider, MD  aspirin EC 81 MG tablet Take 1 tablet (81 mg total) by mouth daily. 08/26/13   Larey Dresser, MD  ranitidine (ZANTAC) 150 MG capsule Take 1 capsule (150 mg total) by mouth daily. 07/31/15   Milton Ferguson, MD  warfarin (COUMADIN) 5 MG tablet TAKE ONE TABLET BY MOUTH ONCE DAILY AS DIRECTED  11/30/15   Evans Lance, MD  [facility] HAZEL EX Apply 1 application topically at bedtime as needed (knee pain).     Historical Provider, MD    ALLERGIES:  Allergies  Allergen Reactions  . Oxycodone Itching    SOCIAL HISTORY:  Social History  Substance Use Topics  . Smoking status: Former Smoker -- 0.50 packs/day for 36 years    Types: Cigarettes    Quit date: 03/15/2013  . Smokeless tobacco: Never Used  . Alcohol Use: 3.6 oz/week    6 Glasses of wine per week     Comment: 03/15/2013 "bottle of wine/wk"     FAMILY HISTORY: Family History  Problem Relation Age of Onset  . Sarcoidosis Sister   . Stroke Father   . Heart attack Neg Hx   . Cancer Mother     EXAM: BP 97/71 mmHg  Pulse 119  Temp(Src) 98.5 F (36.9 C) (Oral)  Resp 24  SpO2 100%  LMP 01/28/2013   CONSTITUTIONAL: Alert and oriented and responds appropriately to questions. Well-appearing; well-nourished HEAD: Normocephalic EYES: Conjunctivae clear, PERRL ENT: normal nose; no rhinorrhea; dry mucous membranes; pharynx without lesions noted NECK: Supple, no meningismus, no LAD  CARD: regular and tachycardic; S1 and S2 appreciated; no murmurs, no clicks, no rubs, no gallops RESP: Normal chest excursion without splinting or tachypnea; breath sounds clear and equal bilaterally; no wheezes, no rhonchi, no rales, no hypoxia or respiratory distress, has a wet cough ABD/GI: Normal bowel sounds; non-distended; soft, non-tender, no rebound, no guarding BACK:  The back appears normal and is non-tender to palpation, there is no CVA tenderness EXT: Normal ROM in all joints; non-tender to palpation; no edema; normal capillary refill; no cyanosis    SKIN: Normal color for age and race; warm NEURO: Moves all extremities equally PSYCH: The patient's mood and manner are appropriate. Grooming and personal hygiene are appropriate.  MEDICAL DECISION MAKING: Patient here with mild hypotension, tachycardia and appears dry on exam. Suspect dehydration. Abdominal exam is benign. She does have a cough. Labs show mild leukopenia. Likely viral illness. Will obtain a flu swab. She does have acute kidney injury likely related to dehydration. She is receiving IV fluids. I feel she will need admission for IV hydration for her acute kidney injury. She is comfortable with this plan. Blood pressures are improving with IV hydration. At this time I do not feel she has sepsis or needs broad-spectrum antibiotics.  I suspect that her tachycardia and hypotension or  secondary to being dehydrated from vomiting and diarrhea. She does not have a fever currently. I suspect that this is a viral illness. Chest x-ray does not show pneumonia. Lactate is normal.  ED PROGRESS: Patient had some redness up her arm after receiving IV Zofran. No significant rash. She reports it is pruritic. We'll give IV Benadryl and continue to monitor.   Urine shows protein and blood and many bacteria but also many squamous cells. She is not having urinary symptoms. Suspect this is a dirty catch.   5:00 AM  Discussed patient's case with hospitalist, Dr. Tamala Julian.  Recommend admission to telemetry, observation bed.  I will place holding orders per their request. Patient and family (if present) updated with plan. Care transferred to hospitalist service.  I reviewed all nursing notes, vitals, pertinent old records, EKGs, labs, imaging (as available).   EKG Interpretation  Date/Time:  Sunday January 10 2016 02:05:53 EDT Ventricular Rate:  116 PR Interval:  140 QRS Duration: 99 QT Interval:  353 QTC Calculation: 490 R Axis:   -29 Text Interpretation:  Sinus tachycardia Biatrial enlargement Borderline left axis deviation Abnormal R-wave progression, early transition Borderline prolonged QT interval No significant change since last tracing Confirmed by Brylyn Novakovich,  DO, Aamir Mclinden [id number]) on 01/10/2016 2:25:06 AM        I personally performed the services described in this documentation, which was scribed in my presence. The recorded information has been reviewed and is accurate.    [place], DO 01/10/16 [phone]

## 2016-01-10 NOTE — Progress Notes (Signed)
ANTICOAGULATION CONSULT NOTE - Initial Consult  Pharmacy Consult for Warfarin (and Lovenox while INR is <2.5) Indication: Mechanical mitral valve  Allergies  Allergen Reactions  . Oxycodone Itching   Vital Signs: Temp: 98.4 F (36.9 C) (04/16 0328) Temp Source: Oral (04/16 0328) BP: 97/63 mmHg (04/16 0500) Pulse Rate: 99 (04/16 0500)  Labs:  Recent Labs  01/10/16 0201  HGB 14.7  HCT 43.0  PLT 283  LABPROT 16.9*  INR 1.36  CREATININE 2.34*    Medical History: Past Medical History  Diagnosis Date  . Asthma     "as a baby"   . Anemia   . History of blood transfusion     "14 w/1st pregnancy; 2 w/last C-section" (03/15/2013)  . GERD (gastroesophageal reflux disease)   . Hypertension     no pcp   will go to [facility]  . Severe mitral regurgitation 04/22/2013  . SVT (supraventricular tachycardia) ([place]) 03/16/2013  . Acute diastolic heart failure ([place]) 03/15/2013  . 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 Tricuspid regurgitation   . S/P mitral valve replacement with metallic valve 123XX123    31mm Sorin Carbomedics mechanical prosthesis via right mini thoracotomy approach  . 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    Complex valvuloplasty including Cor-matrix ECM patch augmentation of anterior and lateral leaflets with 51mm Edwards mc3 ring annuloplasty via right mini-thoracotomy approach  . Complete heart block ([place]) 05/28/2013    Post-op  . Epigastric hernia 200's  . Hx of echocardiogram     a. Echo 4/16:  Mild focal basal septal hypertrophy, EF 60-65%, no RWMA, Mechanical MVR ok with mild central regurgitation and no perivalvular leak, small mobile density attached to valvular ring (1.5x1 cm) - post surgical changes vs SBE, mild LAE;   b. Echo 2/17:  Mild post wall LVH, EF 55-60%, no RWMA, mechanical MVR ok (mean 5 mmHg), normal RVSF  . Carotid stenosis     Carotid US 2/17: bilateral ICA 1-39% >> FU prn   Assessment: 55 y/o F with mechanical MVR on warfarin  as outpatient, here with flu-like symptoms, INR is sub-therapeutic at 1.36 (goal 2.5-3.5), CBC good, noted renal dysfunction. MD wishes to bridge with Lovenox while INR is <2.5.  Outpatient warfarin dose per outpatient anti-coag notes: 7.5 mg Mon/Wed/Fri, 5 mg all other days  Goal of Therapy:  INR 2.5-3.5 Monitor platelets by anticoagulation protocol: Yes   Plan:  -Warfarin 7.5 mg PO x 1 tonight at 1800 -Lovenox 60 mg subcutaneous q24h -Daily PT/INR -Monitor for bleeding -DC Lovenox when INR >2.5  Narda Bonds 01/10/2016,6:06 AM

## 2016-01-10 NOTE — Progress Notes (Signed)
Received patient from ED,AAO X 4;no acute distress ,V/S taken &  Recorded .Saline lock in place ;no redness  Noted.Oriented pt.to the room ,call bell & information packet given to pt.ID bracelet applied to pt.Fall assessment completed with pt.& able to verbalized understanding to call nurse when getting OOB.Skin dry & intact.Placed pt.on tele.Will endorsed to next shift nurse.

## 2016-01-11 DIAGNOSIS — Z952 Presence of prosthetic heart valve: Secondary | ICD-10-CM | POA: Diagnosis not present

## 2016-01-11 DIAGNOSIS — K219 Gastro-esophageal reflux disease without esophagitis: Secondary | ICD-10-CM | POA: Diagnosis present

## 2016-01-11 DIAGNOSIS — J111 Influenza due to unidentified influenza virus with other respiratory manifestations: Secondary | ICD-10-CM | POA: Diagnosis present

## 2016-01-11 DIAGNOSIS — I4581 Long QT syndrome: Secondary | ICD-10-CM | POA: Diagnosis present

## 2016-01-11 DIAGNOSIS — R3121 Asymptomatic microscopic hematuria: Secondary | ICD-10-CM | POA: Diagnosis present

## 2016-01-11 DIAGNOSIS — N179 Acute kidney failure, unspecified: Secondary | ICD-10-CM | POA: Diagnosis not present

## 2016-01-11 DIAGNOSIS — I6529 Occlusion and stenosis of unspecified carotid artery: Secondary | ICD-10-CM | POA: Diagnosis present

## 2016-01-11 DIAGNOSIS — T450X5A Adverse effect of antiallergic and antiemetic drugs, initial encounter: Secondary | ICD-10-CM | POA: Diagnosis present

## 2016-01-11 DIAGNOSIS — L299 Pruritus, unspecified: Secondary | ICD-10-CM | POA: Diagnosis present

## 2016-01-11 DIAGNOSIS — Z7901 Long term (current) use of anticoagulants: Secondary | ICD-10-CM | POA: Diagnosis not present

## 2016-01-11 DIAGNOSIS — R197 Diarrhea, unspecified: Secondary | ICD-10-CM | POA: Diagnosis not present

## 2016-01-11 DIAGNOSIS — Y92238 Other place in hospital as the place of occurrence of the external cause: Secondary | ICD-10-CM | POA: Diagnosis present

## 2016-01-11 DIAGNOSIS — Z87891 Personal history of nicotine dependence: Secondary | ICD-10-CM | POA: Diagnosis not present

## 2016-01-11 DIAGNOSIS — I10 Essential (primary) hypertension: Secondary | ICD-10-CM | POA: Diagnosis present

## 2016-01-11 DIAGNOSIS — Z885 Allergy status to narcotic agent status: Secondary | ICD-10-CM | POA: Diagnosis not present

## 2016-01-11 LAB — PHOSPHORUS: PHOSPHORUS: 2.5 mg/dL (ref 2.5–4.6)

## 2016-01-11 LAB — CBC
HCT: 35.7 % — ABNORMAL LOW (ref 36.0–46.0)
HCT: 34.8 % — ABNORMAL LOW (ref 36.0–46.0)
HEMATOCRIT: 34.4 % — AB (ref 36.0–46.0)
HEMOGLOBIN: 11.9 g/dL — AB (ref 12.0–15.0)
HEMOGLOBIN: 12.1 g/dL (ref 12.0–15.0)
Hemoglobin: 11.7 g/dL — ABNORMAL LOW (ref 12.0–15.0)
MCH: 34.5 pg — ABNORMAL HIGH (ref 26.0–34.0)
MCH: 33 pg (ref 26.0–34.0)
MCH: 32.9 pg (ref 26.0–34.0)
MCHC: 34.6 g/dL (ref 30.0–36.0)
MCHC: 33.9 g/dL (ref 30.0–36.0)
MCHC: 33.6 g/dL (ref 30.0–36.0)
MCV: 99.7 fL (ref 78.0–100.0)
MCV: 97.3 fL (ref 78.0–100.0)
MCV: 97.8 fL (ref 78.0–100.0)
PLATELETS: 253 10*3/uL (ref 150–400)
Platelets: 244 10*3/uL (ref 150–400)
Platelets: 252 10*3/uL (ref 150–400)
RBC: 3.45 MIL/uL — ABNORMAL LOW (ref 3.87–5.11)
RBC: 3.67 MIL/uL — ABNORMAL LOW (ref 3.87–5.11)
RBC: 3.56 MIL/uL — AB (ref 3.87–5.11)
RDW: 13.8 % (ref 11.5–15.5)
RDW: 13.6 % (ref 11.5–15.5)
RDW: 13.5 % (ref 11.5–15.5)
WBC: 3.3 10*3/uL — ABNORMAL LOW (ref 4.0–10.5)
WBC: 3.3 10*3/uL — ABNORMAL LOW (ref 4.0–10.5)
WBC: 3 10*3/uL — AB (ref 4.0–10.5)

## 2016-01-11 LAB — BASIC METABOLIC PANEL
ANION GAP: 7 (ref 5–15)
BUN: 7 mg/dL (ref 6–20)
CHLORIDE: 114 mmol/L — AB (ref 101–111)
CO2: 20 mmol/L — AB (ref 22–32)
Calcium: 7.7 mg/dL — ABNORMAL LOW (ref 8.9–10.3)
Creatinine, Ser: 0.92 mg/dL (ref 0.44–1.00)
GFR calc Af Amer: >60 mL/min (ref >60)
GLUCOSE: 86 mg/dL (ref 65–99)
POTASSIUM: 3.7 mmol/L (ref 3.5–5.1)
Sodium: 141 mmol/L (ref 135–145)

## 2016-01-11 LAB — PROTIME-INR
INR: 1.91 — ABNORMAL HIGH (ref 0.00–1.49)
Prothrombin Time: 21.8 seconds — ABNORMAL HIGH (ref 11.6–15.2)

## 2016-01-11 LAB — MAGNESIUM: MAGNESIUM: 1.6 mg/dL — AB (ref 1.7–2.4)

## 2016-01-11 MED ORDER — ENOXAPARIN SODIUM 60 MG/0.6ML ~~LOC~~ SOLN
60.0000 mg | Freq: Two times a day (BID) | SUBCUTANEOUS | Status: DC
  Administered 2016-01-11: 60 mg via SUBCUTANEOUS
  Filled 2016-01-11: qty 0.6

## 2016-01-11 MED ORDER — OSELTAMIVIR PHOSPHATE 30 MG PO CAPS
30.0000 mg | ORAL_CAPSULE | Freq: Two times a day (BID) | ORAL | Status: DC
  Administered 2016-01-11 – 2016-01-13 (×4): 30 mg via ORAL
  Filled 2016-01-11 (×4): qty 1

## 2016-01-11 MED ORDER — WARFARIN SODIUM 7.5 MG PO TABS
7.5000 mg | ORAL_TABLET | Freq: Once | ORAL | Status: AC
  Administered 2016-01-11: 7.5 mg via ORAL
  Filled 2016-01-11: qty 1

## 2016-01-11 NOTE — Progress Notes (Signed)
ANTICOAGULATION CONSULT NOTE - Follow Up Consult  Pharmacy Consult for warfarin and Lovenox Indication: mechanical MVR  Allergies  Allergen Reactions  . Oxycodone Itching    Patient Measurements: Height: 5\' 2"  (157.5 cm) Weight: 134 lb 14.7 oz (61.2 kg) IBW/kg (Calculated) : 50.1  Vital Signs: Temp: 98.9 F (37.2 C) (04/17 0530) Temp Source: Oral (04/17 0530) BP: 120/74 mmHg (04/17 0530) Pulse Rate: 89 (04/17 0530)  Labs:  Recent Labs  01/10/16 0201 01/11/16 0537  HGB 14.7 11.9*  HCT 43.0 34.4*  PLT 283 244  LABPROT 16.9* 21.8*  INR 1.36 1.91*  CREATININE 2.34* 0.92    Estimated Creatinine Clearance: 60.8 mL/min (by C-G formula based on Cr of 0.92).   Assessment: 55 y/o female admitted 01/10/2016 with flu like symptoms and AKI. She takes warfarin PTA for mechanical MVR and admit INR is subtherapeutic at 1.36.  INR is subtherapeutic at 1.91 but trending in the right direction. Patient reported bloody bowel movement this morning and has had some hematuria. Initially RN held Lovenox dose this morning but now it is charted as given. Spoke with Dr. Wendee Beavers about bleeding issues and he would like to continue the current plan for now and check serial Hb's. Hb 11.9<<14.7 but was hypovolemic on admit and received fluids so may just be dilutional. Renal function improved.  PTA: 5 mg daily except 7.5 mg MWF  Goal of Therapy:  Anti-Xa level 0.6-1 units/ml 4hrs after LMWH dose given INR 2.5-3.5 Monitor platelets by anticoagulation protocol: Yes   Plan:  Increase Lovenox to 60 mg SQ q12h Warfarin 7.5 mg PO tonight Daily INR, CBC q8h x6 per MD Monitor for s/sx of bleeding  [facility], [place].D., BCPS Clinical Pharmacist Pager: [phone] 01/11/2016 1:35 PM

## 2016-01-11 NOTE — Care Management Note (Addendum)
Case Management Note  Patient Details  Name: Mary [name] MRN: [id number] Date of Birth: 1961/01/03  Subjective/Objective:                 Patient in obs for AKI. Spoke to patient at the bedside, she states she lives at home alone, and is independent. Patient states that she recently received her medicaid card and was not aware she was assigned to a PCP. Her card is at home. CM informed patient that she also has SW through [place], and the SW can change the PCP and send a new card with new PCP on it if patient calls number on card and requests it. Patient stated she will f/u with PCP and thanked CM for explaining that to her. She states that she gets her coumadin level checked at [facility] and is followed by their cardiologist as well for valves. Patient on Lovenox bridge to INR 2.5, however per RN patient experienced some bloody stools this morning.    Action/Plan:  CM will continue to follow for home needs. Addendum 01-13-16 Patien to DC home today with Lovenox, covered by Medicaid. No CM needs.  Expected Discharge Date:                  Expected Discharge Plan:  Home/Self Care  In-House Referral:     Discharge planning Services  CM Consult  Post Acute Care Choice:    Choice offered to:     DME Arranged:    DME Agency:     HH Arranged:    HH Agency:     Status of Service:  In process, will continue to follow  Medicare Important Message Given:    Date Medicare IM Given:    Medicare IM give by:    Date Additional Medicare IM Given:    Additional Medicare Important Message give by:     If discussed at [place] of Stay Meetings, dates discussed:    Additional Comments:  Carles Collet, RN 01/11/2016, 12:03 PM

## 2016-01-11 NOTE — Progress Notes (Signed)
Dr. Wendee Beavers notified of pink bowel movement this am. Will continue to monitor. Patient will call nurse if she has another episode.

## 2016-01-11 NOTE — Progress Notes (Signed)
PROGRESS NOTE                                                                                                                                                                                                             Patient Demographics:    Mary [name], is a 55 y.o. female, DOB - Dec 21, 1960, FY:[id number]  Admit date - 01/10/2016   Admitting Physician Norval Morton, MD  Outpatient Primary MD for the patient is Loralie Champagne, MD  LOS - 0   Chief Complaint  Patient presents with  . Influenza  . Emesis       Brief Narrative  55 y/o that presented To the hospital with persistent flulike symptoms. 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    Subjective:    Mary [name] today reports feeling better.    Assessment  & Plan :    Principal Problem:     Flu-like symptoms - Place on Tamiflu given positive influenza screen.  AKI (acute kidney injury) ([place]) - Has resolved with IV fluid rehydration.  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 We'll continue    Nausea and vomiting - Resolved    Subtherapeutic international normalized ratio (INR) -We'll continue Coumadin but may have to stop depending on CBC and hemoglobin values.    Hematuria - Will continue to monitor cbc levels. Patient currently had increased risk of stroke given history of metal heart valve.  Blood per rectum -Reported as minimal repeat hemoglobin up from last check.  Code Status : Full  Family Communication  : Discussed directly with patient  Disposition Plan  : Pending improvement in condition  Barriers For Discharge : Reports of hematuria as well as reported bright red blood per  rectum and subtherapeutic INR levels  Consults  :  None  Procedures  : None  DVT Prophylaxis  :  Lovenox bridge to coumadin  Lab Results  Component Value Date   PLT 252 01/11/2016    Antibiotics  :  None  Anti-infectives    Start     Dose/Rate Route Frequency Ordered Stop   01/11/16 2200  oseltamivir (TAMIFLU) capsule 30 mg  30 mg Oral 2 times daily 01/11/16 1115 01/15/16 0959   01/10/16 1430  oseltamivir (TAMIFLU) capsule 30 mg  Status:  Discontinued     30 mg Oral Daily 01/10/16 1331 01/11/16 1115   01/10/16 1330  oseltamivir (TAMIFLU) capsule 75 mg  Status:  Discontinued     75 mg Oral Daily 01/10/16 1329 01/10/16 1331        Objective:   Filed Vitals:   01/10/16 2222 01/11/16 0505 01/11/16 0530 01/11/16 1627  BP: 132/95 116/62 120/74 111/78  Pulse: 101 87 89 89  Temp: 98.8 F (37.1 C) 98.9 F (37.2 C) 98.9 F (37.2 C) 98.4 F (36.9 C)  TempSrc: Oral Oral Oral Oral  Resp: 18 16 20 20   Height:      Weight:      SpO2: 98% 99% 100% 100%    Wt Readings from Last 3 Encounters:  01/10/16 61.2 kg (134 lb 14.7 oz)  10/21/15 63.05 kg (139 lb)  09/13/15 64.5 kg (142 lb 3.2 oz)     Intake/Output Summary (Last 24 hours) at 01/11/16 1650 Last data filed at 01/10/16 1800  Gross per 24 hour  Intake 926.67 ml  Output      0 ml  Net 926.67 ml     Physical Exam  Awake Alert, Oriented X 3, No new F.N deficits, Normal affect [place].AT,PERRAL Supple Neck,No JVD, No cervical lymphadenopathy appriciated.  Symmetrical Chest wall movement, Good air movement bilaterally, CTAB RRR,No Gallops,Rubs or new Murmurs, No Parasternal Heave +ve B.Sounds, Abd Soft, No tenderness, No organomegaly appriciated, No rebound - guarding or rigidity. No Cyanosis, Clubbing or edema    Data Review:    CBC  Recent Labs Lab 01/10/16 0201 01/11/16 0537 01/11/16 1402  WBC 3.2* 3.3* 3.3*  HGB 14.7 11.9* 12.1  HCT 43.0 34.4* 35.7*  PLT 283 244 252  MCV 98.4 99.7 97.3  MCH 33.6  34.5* 33.0  MCHC 34.2 34.6 33.9  RDW 13.3 13.8 13.6    Chemistries   Recent Labs Lab 01/10/16 0201 01/10/16 0920 01/11/16 0537  NA 136  --  141  K 3.8  --  3.7  CL 103  --  114*  CO2 20*  --  20*  GLUCOSE 116*  --  86  BUN 13  --  7  CREATININE 2.34*  --  0.92  CALCIUM 8.6*  --  7.7*  MG  --  1.5* 1.6*  AST 87*  --   --   ALT 29  --   --   ALKPHOS 98  --   --   BILITOT 0.3  --   --    ------------------------------------------------------------------------------------------------------------------ No results for input(s): CHOL, HDL, LDLCALC, TRIG, CHOLHDL, LDLDIRECT in the last 72 hours.  Lab Results  Component Value Date   HGBA1C 5.4 09/13/2015   ------------------------------------------------------------------------------------------------------------------  Recent Labs  01/10/16 0730  TSH 1.725   ------------------------------------------------------------------------------------------------------------------ No results for input(s): VITAMINB12, FOLATE, FERRITIN, TIBC, IRON, RETICCTPCT in the last 72 hours.  Coagulation profile  Recent Labs Lab 01/10/16 0201 01/11/16 0537  INR 1.36 1.91*    No results for input(s): DDIMER in the last 72 hours.  Cardiac Enzymes No results for input(s): CKMB, TROPONINI, MYOGLOBIN in the last 168 hours.  Invalid input(s): CK ------------------------------------------------------------------------------------------------------------------    Component Value Date/Time   BNP 36.5 11/25/2014 1100    Inpatient Medications  Scheduled Meds: . aspirin EC  81 mg Oral Daily  . enoxaparin (LOVENOX) injection  60 mg Subcutaneous Q12H  .  guaiFENesin  600 mg Oral BID  . oseltamivir  30 mg Oral BID  . warfarin  7.5 mg Oral ONCE-1800  . Warfarin - Pharmacist Dosing Inpatient   Does not apply q1800   Continuous Infusions:  PRN Meds:.acetaminophen **OR** acetaminophen, albuterol, ondansetron **OR** ondansetron (ZOFRAN) IV,  polyethylene glycol, traZODone  Micro Results No results found for this or any previous visit (from the past 240 hour(s)).  Radiology Reports Ct Abdomen Pelvis Wo Contrast  01/10/2016  CLINICAL DATA:  55 year old female with mid abdominal pain bilaterally. Hematuria. Nausea, vomiting, diarrhea and fever for the past 4 days. EXAM: CT ABDOMEN AND PELVIS WITHOUT CONTRAST TECHNIQUE: Multidetector CT imaging of the abdomen and pelvis was performed following the standard protocol without IV contrast. COMPARISON:  CT the abdomen and pelvis 05/21/2013. FINDINGS: Lower chest:  Mechanical mitral valve.  Tricuspid annuloplasty ring. Hepatobiliary: Status post cholecystectomy. No definite cystic or solid hepatic lesions are identified in the liver on today's noncontrast CT examination. Pancreas: No pancreatic mass or peripancreatic inflammatory changes on today's noncontrast CT examination. Spleen: Unremarkable. Adrenals/Urinary Tract: There are no abnormal calcifications within the collecting system of either kidney, along the course of either ureter, or within the lumen of the urinary bladder. No hydroureteronephrosis or perinephric stranding to suggest urinary tract obstruction at this time. The unenhanced appearance of the kidneys is unremarkable bilaterally. Unenhanced appearance of the urinary bladder is normal. Bilateral adrenal glands are normal in appearance. Stomach/Bowel: Unenhanced appearance of the stomach is normal. There is no pathologic dilatation of small bowel or colon. The appendix is not confidently identified may be surgically absent. Regardless, there are no inflammatory changes noted adjacent to the cecum to suggest the presence of an acute appendicitis at this time. Vascular/Lymphatic: No significant atherosclerotic disease or aneurysm identified in the abdominal or pelvic vasculature. No lymphadenopathy noted in the abdomen or pelvis. Postoperative changes in the right inguinal region likely from  prior vascular access procedure. Reproductive: Uterus and ovaries are unremarkable in appearance. Other: Epigastric ventral hernia containing a small amount of omental fat. No significant volume of ascites. No pneumoperitoneum. Musculoskeletal: There are no aggressive appearing lytic or blastic lesions noted in the visualized portions of the skeleton. IMPRESSION: 1. No acute findings in the abdomen or pelvis to account for the patient's symptoms. 2. There is a small epigastric ventral hernia which contains only omental fat. No associated bowel incarceration or obstruction at this time. 3. Additional incidental findings, as above. Electronically Signed   By: Vinnie Langton M.D.   On: 01/10/2016 14:56   Dg Chest 2 View  01/10/2016  CLINICAL DATA:  Nausea, vomiting, generalized abdominal pain and dyspnea. Four days duration. EXAM: CHEST  2 VIEW COMPARISON:  09/13/2015 FINDINGS: There are cardiac valvuloplasty is. Heart size is normal. There is moderate hyperinflation. The lungs are clear. There is no pleural effusion. The pulmonary vasculature is normal. Hilar and mediastinal contours are unremarkable unchanged. IMPRESSION: Hyperinflation.  No acute cardiopulmonary findings. Electronically Signed   By: Andreas Newport M.D.   On: 01/10/2016 03:21    Time Spent in minutes  35   Velvet Bathe M.D on 01/11/2016 at 4:50 PM  Between 7am to 7pm - Pager - [phone]  After 7pm go to [email or web address] - password [facility]  [facility] -  Office  [phone]

## 2016-01-11 NOTE — Progress Notes (Signed)
Patient had 1 run of 7 beats of vtach. Paged oncall NP. Awaiting callback or orders placed, will continue to monitor.

## 2016-01-12 LAB — CBC
HCT: 35.7 % — ABNORMAL LOW (ref 36.0–46.0)
HEMOGLOBIN: 11.8 g/dL — AB (ref 12.0–15.0)
MCH: 32.5 pg (ref 26.0–34.0)
MCHC: 33.1 g/dL (ref 30.0–36.0)
MCV: 98.3 fL (ref 78.0–100.0)
PLATELETS: 254 10*3/uL (ref 150–400)
RBC: 3.63 MIL/uL — AB (ref 3.87–5.11)
RDW: 13.6 % (ref 11.5–15.5)
WBC: 2.8 10*3/uL — AB (ref 4.0–10.5)

## 2016-01-12 LAB — PROTIME-INR
INR: 2.62 — ABNORMAL HIGH (ref 0.00–1.49)
Prothrombin Time: 27.6 seconds — ABNORMAL HIGH (ref 11.6–15.2)

## 2016-01-12 MED ORDER — COUMADIN BOOK
Freq: Once | Status: AC
  Administered 2016-01-12: 10:00:00
  Filled 2016-01-12: qty 1

## 2016-01-12 MED ORDER — WARFARIN SODIUM 2.5 MG PO TABS
2.5000 mg | ORAL_TABLET | Freq: Once | ORAL | Status: AC
  Administered 2016-01-12: 2.5 mg via ORAL
  Filled 2016-01-12: qty 1

## 2016-01-12 NOTE — Discharge Instructions (Signed)
Information on my medicine - Coumadin   (Warfarin)  This medication education was reviewed with me or my healthcare representative as part of my discharge preparation.  The pharmacist that spoke with me during my hospital stay was:  [facility], Margot Chimes, [facility]  Why was Coumadin prescribed for you? Coumadin was prescribed for you because you have a blood clot or a medical condition that can cause an increased risk of forming blood clots. Blood clots can cause serious health problems by blocking the flow of blood to the heart, lung, or brain. Coumadin can prevent harmful blood clots from forming. As a reminder your indication for Coumadin is:   Blood Clot Prevention After Heart Valve Surgery  What test will check on my response to Coumadin? While on Coumadin (warfarin) you will need to have an INR test regularly to ensure that your dose is keeping you in the desired range. The INR (international normalized ratio) number is calculated from the result of the laboratory test called prothrombin time (PT).  If an INR APPOINTMENT HAS NOT ALREADY BEEN MADE FOR YOU please schedule an appointment to have this lab work done by your health care provider within 7 days. Your INR goal is 2.5 - 3.5.  Ask your health care provider during an office visit what your goal INR is.  What  do you need to  know  About  COUMADIN? Take Coumadin (warfarin) exactly as prescribed by your healthcare provider about the same time each day.  DO NOT stop taking without talking to the doctor who prescribed the medication.  Stopping without other blood clot prevention medication to take the place of Coumadin may increase your risk of developing a new clot or stroke.  Get refills before you run out.  What do you do if you miss a dose? If you miss a dose, take it as soon as you remember on the same day then continue your regularly scheduled regimen the next day.  Do not take two doses of Coumadin at the same time.  Important Safety  Information A possible side effect of Coumadin (Warfarin) is an increased risk of bleeding. You should call your healthcare provider right away if you experience any of the following: ? Bleeding from an injury or your nose that does not stop. ? Unusual colored urine (red or dark brown) or unusual colored stools (red or black). ? Unusual bruising for unknown reasons. ? A serious fall or if you hit your head (even if there is no bleeding).  Some foods or medicines interact with Coumadin (warfarin) and might alter your response to warfarin. To help avoid this: ? Eat a balanced diet, maintaining a consistent amount of Vitamin K. ? Notify your provider about major diet changes you plan to make. ? Avoid alcohol or limit your intake to 1 drink for women and 2 drinks for men per day. (1 drink is 5 oz. wine, 12 oz. beer, or 1.5 oz. liquor.)  Make sure that ANY health care provider who prescribes medication for you knows that you are taking Coumadin (warfarin).  Also make sure the healthcare provider who is monitoring your Coumadin knows when you have started a new medication including herbals and non-prescription products.  Coumadin (Warfarin)  Major Drug Interactions  Increased Warfarin Effect Decreased Warfarin Effect  Alcohol (large quantities) Antibiotics (esp. Septra/Bactrim, Flagyl, Cipro) Amiodarone (Cordarone) Aspirin (ASA) Cimetidine (Tagamet) Megestrol (Megace) NSAIDs (ibuprofen, naproxen, etc.) Piroxicam (Feldene) Propafenone (Rythmol SR) Propranolol (Inderal) Isoniazid (INH) Posaconazole (Noxafil) Barbiturates (Phenobarbital) Carbamazepine (  Tegretol) Chlordiazepoxide (Librium) Cholestyramine (Questran) Griseofulvin Oral Contraceptives Rifampin Sucralfate (Carafate) Vitamin K   Coumadin (Warfarin) Major Herbal Interactions  Increased Warfarin Effect Decreased Warfarin Effect  Garlic Ginseng Ginkgo biloba Coenzyme Q10 Green tea St. Johns wort    Coumadin (Warfarin)  FOOD Interactions  Eat a consistent number of servings per week of foods HIGH in Vitamin K (1 serving =  cup)  Collards (cooked, or boiled & drained) Kale (cooked, or boiled & drained) Mustard greens (cooked, or boiled & drained) Parsley *serving size only =  cup Spinach (cooked, or boiled & drained) Swiss chard (cooked, or boiled & drained) Turnip greens (cooked, or boiled & drained)  Eat a consistent number of servings per week of foods MEDIUM-HIGH in Vitamin K (1 serving = 1 cup)  Asparagus (cooked, or boiled & drained) Broccoli (cooked, boiled & drained, or raw & chopped) Brussel sprouts (cooked, or boiled & drained) *serving size only =  cup Lettuce, raw (green leaf, endive, romaine) Spinach, raw Turnip greens, raw & chopped   These websites have more information on Coumadin (warfarin):  FailFactory.se; VeganReport.com.au;

## 2016-01-12 NOTE — Progress Notes (Signed)
ANTICOAGULATION CONSULT NOTE - Follow Up Consult  Pharmacy Consult for warfarin and Lovenox Indication: mechanical MVR  Allergies  Allergen Reactions  . Oxycodone Itching    Patient Measurements: Height: 5\' 2"  (157.5 cm) Weight: 134 lb 14.7 oz (61.2 kg) IBW/kg (Calculated) : 50.1  Vital Signs: Temp: 98.1 F (36.7 C) (04/18 0407) Temp Source: Oral (04/17 2109) BP: 130/73 mmHg (04/18 0407) Pulse Rate: 95 (04/18 0407)  Labs:  Recent Labs  01/10/16 0201 01/11/16 0537 01/11/16 1402 01/11/16 2106 01/12/16 0525  HGB 14.7 11.9* 12.1 11.7* 11.8*  HCT 43.0 34.4* 35.7* 34.8* 35.7*  PLT 283 244 252 253 254  LABPROT 16.9* 21.8*  --   --  27.6*  INR 1.36 1.91*  --   --  2.62*  CREATININE 2.34* 0.92  --   --   --     Estimated Creatinine Clearance: 60.8 mL/min (by C-G formula based on Cr of 0.92).   Assessment: 55 y/o female admitted 01/10/2016 with flu like symptoms and AKI. She takes warfarin PTA for mechanical MVR and admit INR was subtherapeutic at 1.36.  INR is therapeutic but is trending up quickly (2.62>>1.91>>1.36). Patient reported bloody bowel movement 4/17 but has had none since, has had some hematuria in the past also. Hb has remained stable in high 11's, platelets are normal. Will give lower than home dose of warfarin today due to rapid rise in INR.  PTA: 5 mg daily except 7.5 mg MWF  Goal of Therapy:  INR 2.5-3.5 Monitor platelets by anticoagulation protocol: Yes   Plan:  Discontinue Lovenox per discussion with Dr. Wendee Beavers Warfarin 2.5 mg PO tonight Daily INR, CBC  Monitor for s/sx of bleeding  [facility], [address].D., BCPS Clinical Pharmacist Pager: [phone] 01/12/2016 8:10 AM

## 2016-01-12 NOTE — Progress Notes (Signed)
PROGRESS NOTE                                                                                                                                                                                                             Patient Demographics:    Mary [name], is a 55 y.o. female, DOB - 03-15-1961, [id number]  Admit date - 01/10/2016   Admitting Physician Norval Morton, MD  Outpatient Primary MD for the patient is Loralie Champagne, MD  LOS - 1   Chief Complaint  Patient presents with  . Influenza  . Emesis       Brief Narrative  55 y/o that presented To the hospital with persistent flulike symptoms. 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    Subjective:    [name] today reports feeling better.    Assessment  & Plan :    Principal Problem:     Flu-like symptoms - Place on Tamiflu given positive influenza screen. Will continue. Patient complaining of diarrhea. I suspect she is most likely having side effect to Tamiflu  Diarrhea - most likely side effect to Tamiflu - will assess magnesium and potassium next am  AKI (acute kidney injury) ([place]) - Has resolved with IV fluid rehydration.  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  - no reports of hematuria by patient reported today - reassess cbc next am.  Blood per rectum -no reports of blood per rectum today. - reassess hgb levels next am.  Code Status : Full  Family Communication  : Discussed directly with patient  Disposition Plan  : Pending  improvement in condition  Barriers For Discharge : diarrhea and assessing cbc while patient is therapeutic to make sure there are no drops in hgb  Consults  :  None  Procedures  : None  DVT Prophylaxis  :  coumadin  Lab Results  Component Value Date   PLT 254 01/12/2016    Antibiotics  :  None  Anti-infectives  Start     Dose/Rate Route Frequency Ordered Stop   01/11/16 2200  oseltamivir (TAMIFLU) capsule 30 mg     30 mg Oral 2 times daily 01/11/16 1115 01/15/16 0959   01/10/16 1430  oseltamivir (TAMIFLU) capsule 30 mg  Status:  Discontinued     30 mg Oral Daily 01/10/16 1331 01/11/16 1115   01/10/16 1330  oseltamivir (TAMIFLU) capsule 75 mg  Status:  Discontinued     75 mg Oral Daily 01/10/16 1329 01/10/16 1331        Objective:   Filed Vitals:   01/11/16 1627 01/11/16 2109 01/12/16 0407 01/12/16 1544  BP: 111/78 123/78 130/73 129/96  Pulse: 89 91 95 98  Temp: 98.4 F (36.9 C) 99 F (37.2 C) 98.1 F (36.7 C)   TempSrc: Oral Oral    Resp: 20 18 16 19   Height:      Weight:      SpO2: 100% 98% 100% 100%    Wt Readings from Last 3 Encounters:  01/10/16 61.2 kg (134 lb 14.7 oz)  10/21/15 63.05 kg (139 lb)  09/13/15 64.5 kg (142 lb 3.2 oz)     Intake/Output Summary (Last 24 hours) at 01/12/16 1633 Last data filed at 01/12/16 0900  Gross per 24 hour  Intake    220 ml  Output      0 ml  Net    220 ml     Physical Exam  Awake Alert, Oriented X 3, No new F.N deficits, Normal affect [place].AT,PERRAL Supple Neck,No JVD, No cervical lymphadenopathy appriciated.  Symmetrical Chest wall movement, Good air movement bilaterally, CTAB RRR,No Gallops,Rubs or new Murmurs, No Parasternal Heave +ve B.Sounds, Abd Soft, No tenderness, No organomegaly appriciated, No rebound - guarding or rigidity. No Cyanosis, Clubbing or edema    Data Review:    CBC  Recent Labs Lab 01/10/16 0201 01/11/16 0537 01/11/16 1402 01/11/16 2106 01/12/16 0525  WBC 3.2* 3.3* 3.3*  3.0* 2.8*  HGB 14.7 11.9* 12.1 11.7* 11.8*  HCT 43.0 34.4* 35.7* 34.8* 35.7*  PLT 283 244 252 253 254  MCV 98.4 99.7 97.3 97.8 98.3  MCH 33.6 34.5* 33.0 32.9 32.5  MCHC 34.2 34.6 33.9 33.6 33.1  RDW 13.3 13.8 13.6 13.5 13.6    Chemistries   Recent Labs Lab 01/10/16 0201 01/10/16 0920 01/11/16 0537  NA 136  --  141  K 3.8  --  3.7  CL 103  --  114*  CO2 20*  --  20*  GLUCOSE 116*  --  86  BUN 13  --  7  CREATININE 2.34*  --  0.92  CALCIUM 8.6*  --  7.7*  MG  --  1.5* 1.6*  AST 87*  --   --   ALT 29  --   --   ALKPHOS 98  --   --   BILITOT 0.3  --   --    ------------------------------------------------------------------------------------------------------------------ No results for input(s): CHOL, HDL, LDLCALC, TRIG, CHOLHDL, LDLDIRECT in the last 72 hours.  Lab Results  Component Value Date   HGBA1C 5.4 09/13/2015   ------------------------------------------------------------------------------------------------------------------  Recent Labs  01/10/16 0730  TSH 1.725   ------------------------------------------------------------------------------------------------------------------ No results for input(s): VITAMINB12, FOLATE, FERRITIN, TIBC, IRON, RETICCTPCT in the last 72 hours.  Coagulation profile  Recent Labs Lab 01/10/16 0201 01/11/16 0537 01/12/16 0525  INR 1.36 1.91* 2.62*    No results for input(s): DDIMER in the last 72 hours.  Cardiac Enzymes No results for input(s): CKMB, TROPONINI,  MYOGLOBIN in the last 168 hours.  Invalid input(s): CK ------------------------------------------------------------------------------------------------------------------    Component Value Date/Time   BNP 36.5 11/25/2014 1100    Inpatient Medications  Scheduled Meds: . aspirin EC  81 mg Oral Daily  . guaiFENesin  600 mg Oral BID  . oseltamivir  30 mg Oral BID  . warfarin  2.5 mg Oral ONCE-1800  . Warfarin - Pharmacist Dosing Inpatient   Does not apply  q1800   Continuous Infusions:  PRN Meds:.acetaminophen **OR** acetaminophen, albuterol, ondansetron **OR** ondansetron (ZOFRAN) IV, polyethylene glycol, traZODone  Micro Results No results found for this or any previous visit (from the past 240 hour(s)).  Radiology Reports Ct Abdomen Pelvis Wo Contrast  01/10/2016  CLINICAL DATA:  55 year old female with mid abdominal pain bilaterally. Hematuria. Nausea, vomiting, diarrhea and fever for the past 4 days. EXAM: CT ABDOMEN AND PELVIS WITHOUT CONTRAST TECHNIQUE: Multidetector CT imaging of the abdomen and pelvis was performed following the standard protocol without IV contrast. COMPARISON:  CT the abdomen and pelvis 05/21/2013. FINDINGS: Lower chest:  Mechanical mitral valve.  Tricuspid annuloplasty ring. Hepatobiliary: Status post cholecystectomy. No definite cystic or solid hepatic lesions are identified in the liver on today's noncontrast CT examination. Pancreas: No pancreatic mass or peripancreatic inflammatory changes on today's noncontrast CT examination. Spleen: Unremarkable. Adrenals/Urinary Tract: There are no abnormal calcifications within the collecting system of either kidney, along the course of either ureter, or within the lumen of the urinary bladder. No hydroureteronephrosis or perinephric stranding to suggest urinary tract obstruction at this time. The unenhanced appearance of the kidneys is unremarkable bilaterally. Unenhanced appearance of the urinary bladder is normal. Bilateral adrenal glands are normal in appearance. Stomach/Bowel: Unenhanced appearance of the stomach is normal. There is no pathologic dilatation of small bowel or colon. The appendix is not confidently identified may be surgically absent. Regardless, there are no inflammatory changes noted adjacent to the cecum to suggest the presence of an acute appendicitis at this time. Vascular/Lymphatic: No significant atherosclerotic disease or aneurysm identified in the abdominal  or pelvic vasculature. No lymphadenopathy noted in the abdomen or pelvis. Postoperative changes in the right inguinal region likely from prior vascular access procedure. Reproductive: Uterus and ovaries are unremarkable in appearance. Other: Epigastric ventral hernia containing a small amount of omental fat. No significant volume of ascites. No pneumoperitoneum. Musculoskeletal: There are no aggressive appearing lytic or blastic lesions noted in the visualized portions of the skeleton. IMPRESSION: 1. No acute findings in the abdomen or pelvis to account for the patient's symptoms. 2. There is a small epigastric ventral hernia which contains only omental fat. No associated bowel incarceration or obstruction at this time. 3. Additional incidental findings, as above. Electronically Signed   By: Vinnie Langton M.D.   On: 01/10/2016 14:56   Dg Chest 2 View  01/10/2016  CLINICAL DATA:  Nausea, vomiting, generalized abdominal pain and dyspnea. Four days duration. EXAM: CHEST  2 VIEW COMPARISON:  09/13/2015 FINDINGS: There are cardiac valvuloplasty is. Heart size is normal. There is moderate hyperinflation. The lungs are clear. There is no pleural effusion. The pulmonary vasculature is normal. Hilar and mediastinal contours are unremarkable unchanged. IMPRESSION: Hyperinflation.  No acute cardiopulmonary findings. Electronically Signed   By: Andreas Newport M.D.   On: 01/10/2016 03:21    Time Spent in minutes  35   Velvet Bathe M.D on 01/12/2016 at 4:33 PM  Between 7am to 7pm - Pager - [phone]  After 7pm go to [email or web address] - password [facility]  [facility] -  Office  [phone]

## 2016-01-13 DIAGNOSIS — N179 Acute kidney failure, unspecified: Principal | ICD-10-CM

## 2016-01-13 LAB — PROTIME-INR
INR: 1.97 — ABNORMAL HIGH (ref 0.00–1.49)
Prothrombin Time: 22.3 seconds — ABNORMAL HIGH (ref 11.6–15.2)

## 2016-01-13 LAB — CBC
HEMATOCRIT: 35.4 % — AB (ref 36.0–46.0)
HEMOGLOBIN: 12 g/dL (ref 12.0–15.0)
MCH: 33.4 pg (ref 26.0–34.0)
MCHC: 33.9 g/dL (ref 30.0–36.0)
MCV: 98.6 fL (ref 78.0–100.0)
Platelets: 300 10*3/uL (ref 150–400)
RBC: 3.59 MIL/uL — AB (ref 3.87–5.11)
RDW: 13.6 % (ref 11.5–15.5)
WBC: 3.5 10*3/uL — ABNORMAL LOW (ref 4.0–10.5)

## 2016-01-13 MED ORDER — ENOXAPARIN SODIUM 60 MG/0.6ML ~~LOC~~ SOLN
60.0000 mg | Freq: Two times a day (BID) | SUBCUTANEOUS | Status: DC
  Administered 2016-01-13: 60 mg via SUBCUTANEOUS
  Filled 2016-01-13: qty 0.6

## 2016-01-13 MED ORDER — OSELTAMIVIR PHOSPHATE 30 MG PO CAPS: 30.0000 mg | ORAL_CAPSULE | Freq: Two times a day (BID) | ORAL | Status: DC

## 2016-01-13 MED ORDER — WARFARIN SODIUM 5 MG PO TABS: 10.0000 mg | ORAL_TABLET | Freq: Once | ORAL | Status: DC

## 2016-01-13 MED ORDER — ENOXAPARIN SODIUM 60 MG/0.6ML ~~LOC~~ SOLN: 60.0000 mg | Freq: Two times a day (BID) | SUBCUTANEOUS | Status: DC

## 2016-01-13 NOTE — Progress Notes (Signed)
RN Made MD aware via text paged that CCMD called RN to inform that pt had 3 beats if vtach. Pt is asymptomatic and denies chest pain at this time. Dorita Fray 01/13/2016 7:41 AM

## 2016-01-13 NOTE — Progress Notes (Signed)
ANTICOAGULATION CONSULT NOTE - Follow Up Consult  Pharmacy Consult for warfarin and Lovenox Indication: mechanical MVR  Allergies  Allergen Reactions  . Oxycodone Itching    Patient Measurements: Height: 5\' 2"  (157.5 cm) Weight: 134 lb 14.7 oz (61.2 kg) IBW/kg (Calculated) : 50.1  Vital Signs: Temp: 97.9 F (36.6 C) (04/19 0537) BP: 134/80 mmHg (04/19 0537) Pulse Rate: 88 (04/19 0537)  Labs:  Recent Labs  01/11/16 0537  01/11/16 2106 01/12/16 0525 01/13/16 0755  HGB 11.9*  < > 11.7* 11.8* 12.0  HCT 34.4*  < > 34.8* 35.7* 35.4*  PLT 244  < > 253 254 300  LABPROT 21.8*  --   --  27.6* 22.3*  INR 1.91*  --   --  2.62* 1.97*  CREATININE 0.92  --   --   --   --   < > = values in this interval not displayed.  Estimated Creatinine Clearance: 60.8 mL/min (by C-G formula based on Cr of 0.92).   Assessment: 55 y/o female admitted 01/10/2016 with flu like symptoms and AKI. She takes warfarin PTA for mechanical MVR and admit INR was subtherapeutic at 1.36.  INR is SUBtherapeutic today which is surprising as INR was trending up quickly over last several days (1.97<<2.62>>1.91>>1.36). Resuming Lovenox for INR <2.5. Patient reported bloody bowel movement 4/17 but has had none since, has had some hematuria in the past also. Hb has remained stable in high 11's, platelets are normal.   PTA: 5 mg daily except 7.5 mg MWF  Goal of Therapy:  Anti-Xa level 0.6-1 units/ml 4hrs after LMWH dose given INR 2.5-3.5 Monitor platelets by anticoagulation protocol: Yes   Plan:  Lovenox 60 mg SQ q12h - patient agrees to take at discharge after some initial hesitation about giving herself shots Warfarin 10 mg PO tonight Daily INR, CBC  Monitor for s/sx of bleeding Plan is to discharge today  [facility], [place].D., BCPS Clinical Pharmacist Pager: [phone] 01/13/2016 11:36 AM

## 2016-01-13 NOTE — Progress Notes (Signed)
Nsg Discharge Note  Admit Date:  01/10/2016 Discharge date: 01/13/2016   Mary [name] to be D/C'd home per MD order.  AVS completed.  Copy for chart, and copy for patient signed, and dated. Patient able to verbalize understanding. No questions or concerns voiced when asked. RN reviewed how to self  inject lovenox. Pt was able to answer RN questions correctly. When asked pt had no other questions for RN regarding Lovenox at this time.   Discharge Medication:   Medication List    TAKE these medications        acetaminophen 500 MG tablet  Commonly known as:  TYLENOL  Take 500 mg by mouth every 6 (six) hours as needed for mild pain or moderate pain.     aspirin EC 81 MG tablet  Take 1 tablet (81 mg total) by mouth daily.     enoxaparin 60 MG/0.6ML injection  Commonly known as:  LOVENOX  Inject 0.6 mLs (60 mg total) into the skin every 12 (twelve) hours.     oseltamivir 30 MG capsule  Commonly known as:  TAMIFLU  Take 1 capsule (30 mg total) by mouth 2 (two) times daily.     warfarin 5 MG tablet  Commonly known as:  COUMADIN  TAKE ONE TABLET BY MOUTH ONCE DAILY AS DIRECTED     WITCH HAZEL EX  Apply 1 application topically at bedtime as needed (knee pain).        Discharge Assessment: Filed Vitals:   01/12/16 2123 01/13/16 0537  BP: 136/72 134/80  Pulse: 93 88  Temp: 98 F (36.7 C) 97.9 F (36.6 C)  Resp: 18 16   Skin clean, dry and intact without evidence of skin break down, no evidence of skin tears noted. IV catheter discontinued with catheter tip intact. Site without signs and symptoms of complications - no redness or edema noted at insertion site, patient denies c/o pain - only slight tenderness at site.  Dressing with slight pressure applied.  D/c Instructions-Education: Discharge instructions given to patient with verbalized understanding. D/c education completed with patient including follow up instructions, medication list, d/c activities limitations if  indicated, with other d/c instructions as indicated by MD - patient able to verbalize understanding, all questions fully answered.Patient instructed to return to ED, call 911, or call MD for any changes in condition.  Patient in room getting dressed at this time. Pt to call up to desk when ready for volunteer services to take patient to main entrance.    Dorita Fray, RN 01/13/2016 2:10 PM

## 2016-01-13 NOTE — Discharge Summary (Signed)
Physician Discharge Summary  Mary [name] [id number] DOB: 1961-02-07 DOA: 01/10/2016  PCP: Loralie Champagne, MD  Admit date: 01/10/2016 Discharge date: 01/13/2016  Time spent: > 30 minutes  Recommendations for Outpatient Follow-up:  1. Follow up with cardiology coumadin clinic in 2 days as scheduled 2. 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 female with a Past Medical History of rheumatic mitral stenosis with regard status post mitral valve replacement and tricuspid valve repair, carotid stenosis, hypertension, since to the emergency department with persistent flulike symptoms.  Hospital Course:  Influenza - patient with characteristic symptoms, as well as positive influenza screen. She was placed on Tamiflu and supportive treatment with improvement in her clinical condition. She received 3 days of Tamiflu while hospitalized, she is feeling back to normal, will be discharged home in stable condition to complete a total 5 day course of Tamiflu. 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 She reportedly had  one episode of hematuria and small amount of blood per rectum, resolved, without any further episodes, CBC has been stable.  Procedures:  None    Consultations:  None   Discharge Exam: Filed Vitals:   01/12/16 0407 01/12/16 1544 01/12/16 2123 01/13/16 0537  BP: 130/73 129/96 136/72 134/80  Pulse: 95 98 93 88  Temp: 98.1 F (36.7 C)  98 F (36.7 C) 97.9 F (36.6 C)  TempSrc:      Resp: 16 19 18 16   Height:      Weight:      SpO2: 100% 100% 100% 100%    General: NAD Cardiovascular: RRR Respiratory: CTA biL  Discharge Instructions Activity:  As tolerated   Get Medicines reviewed and adjusted: Please take all your medications with you for your next visit with your Primary MD  Please request your Primary MD to go over all hospital tests and procedure/radiological results at the follow up, please ask your Primary MD to get all Hospital records sent to his/her office.  If you experience worsening of your admission symptoms, develop shortness of breath, life threatening emergency, suicidal or homicidal thoughts you must seek medical attention immediately by calling 911 or calling your MD immediately if symptoms less severe.  You must read complete instructions/literature along with all the possible adverse reactions/side effects for all the Medicines you take and that have been prescribed to you. Take any new Medicines after you have completely understood and accpet all the possible adverse reactions/side effects.   Do not drive when taking Pain medications.   Do not take more than prescribed Pain, Sleep and Anxiety Medications  Special Instructions: If you have  smoked or chewed Tobacco in the last 2 yrs please stop smoking, stop any regular Alcohol and or any Recreational drug use.  Wear Seat belts while driving.  Please note  You were cared for by a hospitalist during your hospital stay. Once you are discharged, your primary care physician will handle any further  medical issues. Please note that NO REFILLS for any discharge medications will be authorized once you are discharged, as it is imperative that you return to your primary care physician (or establish a relationship with a primary care physician if you do not have one) for your aftercare needs so that they can reassess your need for medications and monitor your lab values.    Medication List    TAKE these medications        acetaminophen 500 MG tablet  Commonly known as:  TYLENOL  Take 500 mg by mouth every 6 (six) hours as needed for mild pain or moderate pain.     aspirin EC 81 MG tablet  Take 1 tablet (81 mg total) by mouth daily.     enoxaparin 60 MG/0.6ML injection  Commonly known as:  LOVENOX  Inject 0.6 mLs (60 mg total) into the skin every 12 (twelve) hours.     oseltamivir 30 MG capsule  Commonly known as:  TAMIFLU  Take 1 capsule (30 mg total) by mouth 2 (two) times daily.     warfarin 5 MG tablet  Commonly known as:  COUMADIN  TAKE ONE TABLET BY MOUTH ONCE DAILY AS DIRECTED     WITCH HAZEL EX  Apply 1 application topically at bedtime as needed (knee pain).           Follow-up Information    Follow up with Loralie Champagne, MD On 01/15/2016.   Specialty:  Cardiology   Why:  for INR check Appointment with Dr. Aundra Dubin is 01/15/16 at 08:15am   Contact information:   [id number] [place] [address] [phone]       The results of significant diagnostics from this hospitalization (including imaging, microbiology, ancillary and laboratory) are listed below for reference.    Significant Diagnostic Studies: Ct Abdomen Pelvis Wo Contrast  01/10/2016  CLINICAL DATA:  55 year old female with mid abdominal pain bilaterally. Hematuria. Nausea, vomiting, diarrhea and fever for the past 4 days. EXAM: CT ABDOMEN AND PELVIS WITHOUT CONTRAST TECHNIQUE: Multidetector CT imaging of the abdomen and pelvis was performed following the standard protocol without IV  contrast. COMPARISON:  CT the abdomen and pelvis 05/21/2013. FINDINGS: Lower chest:  Mechanical mitral valve.  Tricuspid annuloplasty ring. Hepatobiliary: Status post cholecystectomy. No definite cystic or solid hepatic lesions are identified in the liver on today's noncontrast CT examination. Pancreas: No pancreatic mass or peripancreatic inflammatory changes on today's noncontrast CT examination. Spleen: Unremarkable. Adrenals/Urinary Tract: There are no abnormal calcifications within the collecting system of either kidney, along the course of either ureter, or within the lumen of the urinary bladder. No hydroureteronephrosis or perinephric stranding to suggest urinary tract obstruction at this time. The unenhanced appearance of the kidneys is unremarkable bilaterally. Unenhanced appearance of the urinary bladder is normal. Bilateral adrenal glands are normal in appearance. Stomach/Bowel: Unenhanced appearance of the stomach is normal. There is no pathologic dilatation of small bowel or colon. The appendix is not confidently identified may be surgically absent. Regardless, there are no inflammatory changes noted adjacent to the cecum to suggest the presence of an acute appendicitis at this time. Vascular/Lymphatic: No significant atherosclerotic  disease or aneurysm identified in the abdominal or pelvic vasculature. No lymphadenopathy noted in the abdomen or pelvis. Postoperative changes in the right inguinal region likely from prior vascular access procedure. Reproductive: Uterus and ovaries are unremarkable in appearance. Other: Epigastric ventral hernia containing a small amount of omental fat. No significant volume of ascites. No pneumoperitoneum. Musculoskeletal: There are no aggressive appearing lytic or blastic lesions noted in the visualized portions of the skeleton. IMPRESSION: 1. No acute findings in the abdomen or pelvis to account for the patient's symptoms. 2. There is a small epigastric ventral hernia  which contains only omental fat. No associated bowel incarceration or obstruction at this time. 3. Additional incidental findings, as above. Electronically Signed   By: Vinnie Langton M.D.   On: 01/10/2016 14:56   Dg Chest 2 View  01/10/2016  CLINICAL DATA:  Nausea, vomiting, generalized abdominal pain and dyspnea. Four days duration. EXAM: CHEST  2 VIEW COMPARISON:  09/13/2015 FINDINGS: There are cardiac valvuloplasty is. Heart size is normal. There is moderate hyperinflation. The lungs are clear. There is no pleural effusion. The pulmonary vasculature is normal. Hilar and mediastinal contours are unremarkable unchanged. IMPRESSION: Hyperinflation.  No acute cardiopulmonary findings. Electronically Signed   By: Andreas Newport M.D.   On: 01/10/2016 03:21   Labs: Basic Metabolic Panel:  Recent Labs Lab 01/10/16 0201 01/10/16 0920 01/11/16 0537  NA 136  --  141  K 3.8  --  3.7  CL 103  --  114*  CO2 20*  --  20*  GLUCOSE 116*  --  86  BUN 13  --  7  CREATININE 2.34*  --  0.92  CALCIUM 8.6*  --  7.7*  MG  --  1.5* 1.6*  PHOS  --  4.2 2.5   Liver Function Tests:  Recent Labs Lab 01/10/16 0201  AST 87*  ALT 29  ALKPHOS 98  BILITOT 0.3  PROT 6.9  ALBUMIN 3.0*   CBC:  Recent Labs Lab 01/11/16 0537 01/11/16 1402 01/11/16 2106 01/12/16 0525 01/13/16 0755  WBC 3.3* 3.3* 3.0* 2.8* 3.5*  HGB 11.9* 12.1 11.7* 11.8* 12.0  HCT 34.4* 35.7* 34.8* 35.7* 35.4*  MCV 99.7 97.3 97.8 98.3 98.6  PLT 244 252 253 254 300     Signed:  GHERGHE, COSTIN  [facility] 01/13/2016, 2:53 PM

## 2016-01-15 ENCOUNTER — Ambulatory Visit (INDEPENDENT_AMBULATORY_CARE_PROVIDER_SITE_OTHER): Payer: Medicaid Other

## 2016-01-15 DIAGNOSIS — I059 Rheumatic mitral valve disease, unspecified: Secondary | ICD-10-CM | POA: Diagnosis not present

## 2016-01-15 DIAGNOSIS — Z954 Presence of other heart-valve replacement: Secondary | ICD-10-CM | POA: Diagnosis not present

## 2016-01-15 DIAGNOSIS — Z7901 Long term (current) use of anticoagulants: Secondary | ICD-10-CM | POA: Diagnosis not present

## 2016-01-15 DIAGNOSIS — Z952 Presence of prosthetic heart valve: Secondary | ICD-10-CM

## 2016-01-15 LAB — POCT INR: INR: 2.4

## 2016-02-01 ENCOUNTER — Encounter: Payer: Self-pay | Admitting: Physician Assistant

## 2016-02-01 ENCOUNTER — Ambulatory Visit (INDEPENDENT_AMBULATORY_CARE_PROVIDER_SITE_OTHER): Payer: Medicaid Other | Admitting: *Deleted

## 2016-02-01 ENCOUNTER — Ambulatory Visit (INDEPENDENT_AMBULATORY_CARE_PROVIDER_SITE_OTHER): Payer: Medicaid Other | Admitting: Physician Assistant

## 2016-02-01 VITALS — BP 110/74 | HR 110 | Ht 62.0 in | Wt 137.8 lb

## 2016-02-01 DIAGNOSIS — R791 Abnormal coagulation profile: Secondary | ICD-10-CM

## 2016-02-01 DIAGNOSIS — I059 Rheumatic mitral valve disease, unspecified: Secondary | ICD-10-CM | POA: Diagnosis not present

## 2016-02-01 DIAGNOSIS — R000 Tachycardia, unspecified: Secondary | ICD-10-CM

## 2016-02-01 DIAGNOSIS — Z7901 Long term (current) use of anticoagulants: Secondary | ICD-10-CM | POA: Diagnosis not present

## 2016-02-01 DIAGNOSIS — Z952 Presence of prosthetic heart valve: Secondary | ICD-10-CM

## 2016-02-01 DIAGNOSIS — Z954 Presence of other heart-valve replacement: Secondary | ICD-10-CM

## 2016-02-01 DIAGNOSIS — I442 Atrioventricular block, complete: Secondary | ICD-10-CM

## 2016-02-01 LAB — POCT INR: INR: 1.5

## 2016-02-01 NOTE — Progress Notes (Signed)
Cardiology Office Note    Date:  02/01/2016   ID:  Mary [name], Mary [name] 18-Feb-1961, MRN [id number]  PCP:  Marca Ancona, MD  Cardiologist: Dr. Shirlee Latch  Chief Complaint  Patient presents with  . Chest Pain    pt states no chest pain today  . Shortness of Breath    states she gets SOB when laying flat    History of Present Illness:  Mary [name] is a 55 y.o. female patient with history of mechanical MVR/tricuspid valve repair on chronic Coumadin. She developed complete heart block postop that resolved. She has hypertension and carotid stenosis. Left heart cath in 02/2013 showed no significant CAD. She recently had a hospitalization with influenza and her INR was subtherapeutic she was bridged with Lovenox and had an INR of 2.6 however did discharge it dropped down to 1.97 and she was given Lovenox prescription and to be seen in the Coumadin clinic in 2 days. She was also given a prescription for Tamiflu. She could not afford either of these prescriptions and did not do the Lovenox bridge.  Patient comes in today still coughing from the flu. She is tachycardic. She says she can feel it but it doesn't really bother her. She drinks about 216 ounces of Coke a week. She does not drink caffeine daily. She has occasional coffee and tea but not often. She denies palpitations, dizziness or presyncope. She has chronic two-pillow orthopnea since her surgery.   Past Medical History  Diagnosis Date  . Asthma     "as a baby"   . Anemia   . History of blood transfusion     "14 w/1st pregnancy; 2 w/last C-section" (03/15/2013)  . GERD (gastroesophageal reflux disease)   . Hypertension     no pcp   will go to [facility]  . Severe mitral regurgitation 04/22/2013  . SVT (supraventricular tachycardia) (HCC) 03/16/2013  . Acute diastolic heart failure (HCC) 03/15/2013  . 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 Tricuspid regurgitation   . S/P mitral valve replacement with metallic valve  05/23/2013    33mm Sorin Carbomedics mechanical prosthesis via right mini thoracotomy approach  . 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 Complete heart block (HCC) 05/28/2013    Post-op  . Epigastric hernia 200's  . Hx of echocardiogram     a. Echo 4/16:  Mild focal basal septal hypertrophy, EF 60-65%, no RWMA, Mechanical MVR ok with mild central regurgitation and no perivalvular leak, small mobile density attached to valvular ring (1.5x1 cm) - post surgical changes vs SBE, mild LAE;   b. Echo 2/17:  Mild post wall LVH, EF 55-60%, no RWMA, mechanical MVR ok (mean 5 mmHg), normal RVSF  . Carotid stenosis     Carotid US 2/17: bilateral ICA 1-39% >> FU prn    Past Surgical History  Procedure Laterality Date  . Appendectomy  ~1978  . Cesarean section  1983; 1999  . Laparoscopic cholecystectomy  2003  . Cesarean section with bilateral tubal ligation  1999  . Tee without cardioversion N/A 03/18/2013    Procedure: TRANSESOPHAGEAL ECHOCARDIOGRAM (TEE);  Surgeon: Laurey Morale, MD;  Location: [facility] ENDOSCOPY;  Service: Cardiovascular;  Laterality: N/A;  . Cardiac catheterization    . Multiple extractions with alveoloplasty N/A 04/04/2013    Procedure: Extraction of tooth #'s 1,8,9,13,14,15,23,24,25,26 with alveoloplasty  and gross debridement of remaining teeth;  Surgeon: Charlynne Pander, DDS;  Location: [facility] OR;  Service: Oral Surgery;  Laterality: N/A;  . Minimally invasive tricuspid valve repair Right 05/23/2013    Procedure: MINIMALLY INVASIVE TRICUSPID VALVE REPAIR;  Surgeon: [place] Nails, MD;  Location: [facility] OR;  Service: Open Heart Surgery;  Laterality: Right;  . Intraoperative transesophageal echocardiogram N/A 05/23/2013    Procedure: INTRAOPERATIVE TRANSESOPHAGEAL ECHOCARDIOGRAM;  Surgeon: [place] Nails, MD;  Location:  [facility] OR;  Service: Open Heart Surgery;  Laterality: N/A;  . Femoral hernia repair Right 05/23/2013    Procedure: HERNIA REPAIR FEMORAL;  Surgeon: [place] Nails, MD;  Location: [facility] OR;  Service: Open Heart Surgery;  Laterality: Right;  . Mitral valve replacement N/A 05/23/2013    Procedure: MITRAL VALVE (MV) REPLACEMENT;  Surgeon: [place] Nails, MD;  Location: [facility] OR;  Service: Open Heart Surgery;  Laterality: N/A;  . Tee without cardioversion N/A 06/17/2013    Procedure: TRANSESOPHAGEAL ECHOCARDIOGRAM (TEE);  Surgeon: Lewayne Bunting, MD;  Location: [facility] ENDOSCOPY;  Service: Cardiovascular;  Laterality: N/A;  . Left and right heart catheterization with coronary angiogram N/A 03/22/2013    Procedure: LEFT AND RIGHT HEART CATHETERIZATION WITH CORONARY ANGIOGRAM;  Surgeon: Kathleene Hazel, MD;  Location: [facility] CATH LAB;  Service: Cardiovascular;  Laterality: N/A;    Current Medications: Outpatient Prescriptions Prior to Visit  Medication Sig Dispense Refill  . acetaminophen (TYLENOL) 500 MG tablet Take 500 mg by mouth every 6 (six) hours as needed for mild pain or moderate pain.    [name] aspirin EC 81 MG tablet Take 1 tablet (81 mg total) by mouth daily.    . WITCH HAZEL EX Apply 1 application topically at bedtime as needed (knee pain).     [name] enoxaparin (LOVENOX) 60 MG/0.6ML injection Inject 0.6 mLs (60 mg total) into the skin every 12 (twelve) hours. 4 Syringe 0  . oseltamivir (TAMIFLU) 30 MG capsule Take 1 capsule (30 mg total) by mouth 2 (two) times daily. 3 capsule 0  . warfarin (COUMADIN) 5 MG tablet TAKE ONE TABLET BY MOUTH ONCE DAILY AS DIRECTED (Patient taking differently: Take 5-7.5 mg by mouth See admin instructions. 7.5 mg on Monday Wednesday Friday. All other days are 5 mg) 120 tablet 1   No facility-administered medications prior to visit.     Allergies:   Oxycodone   Social History   Social History  . Marital Status: Single    Spouse Name: N/A  . Number of Children: N/A  . Years  of Education: N/A   Social History Main Topics  . Smoking status: Former Smoker -- 0.50 packs/day for 36 years    Types: Cigarettes    Quit date: 03/15/2013  . Smokeless tobacco: Never Used  . Alcohol Use: 3.6 oz/week    6 Glasses of wine per week     Comment: 03/15/2013 "bottle of wine/wk"  . Drug Use: No  . Sexual Activity: No   Other Topics Concern  . None   Social History Narrative     Family History:  The patient's    family history includes Cancer in her mother; Sarcoidosis in her sister; Stroke in her father. There is no history of Heart attack.   ROS:   Please see the history of present illness.    Review of Systems  Constitution: Negative.  HENT: Negative.   Cardiovascular: Negative.   Respiratory: Positive for cough.   Hematologic/Lymphatic: Negative.   Musculoskeletal: Negative.  Negative  for joint pain.  Gastrointestinal: Negative.   Genitourinary: Negative.   Neurological: Negative.    All other systems reviewed and are negative.   PHYSICAL EXAM:   VS:  BP 110/74 mmHg  Pulse 110  Ht 5\' 2"  (1.575 m)  Wt 137 lb 12.8 oz (62.506 kg)  BMI 25.20 kg/m2  LMP 01/28/2013   GEN: Well nourished, well developed, in no acute distress Neck: no JVD, carotid bruits, or masses Cardiac:  RRR; crisp valve sounds, no significant murmur, rubs, or gallops,no edema  Respiratory:  Decreased breath sounds but clear to auscultation bilaterally, normal work of breathing GI: soft, nontender, nondistended, + BS MS: no deformity or atrophy Skin: warm and dry, no rash Neuro:  Alert and Oriented x 3, Strength and sensation are intact Psych: euthymic mood, full affect  Wt Readings from Last 3 Encounters:  02/01/16 137 lb 12.8 oz (62.506 kg)  01/10/16 134 lb 14.7 oz (61.2 kg)  10/21/15 139 lb (63.05 kg)      Studies/Labs Reviewed:   EKG:  EKG is  ordered today.  The ekg ordered today demonstrates Sinus tachycardia at 110 bpm  Recent Labs: 01/10/2016: ALT 29; TSH  1.725 01/11/2016: BUN 7; Creatinine, Ser 0.92; Magnesium 1.6*; Potassium 3.7; Sodium 141 01/13/2016: Hemoglobin 12.0; Platelets 300   Lipid Panel    Component Value Date/Time   CHOL 229* 09/13/2015 0545   TRIG 78 09/13/2015 0545   HDL 77 09/13/2015 0545   CHOLHDL 3.0 09/13/2015 0545   VLDL 16 09/13/2015 0545   LDLCALC 136* 09/13/2015 0545    Additional studies/ records that were reviewed today include:   1. 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  In 8/14, had right mini-thoracotomy with mechanical mitral valve replacement (St Jude) + tricuspid valve repair.  Echo (9/14): EF 55-60%, well-seated mechanical mitral valve. 2. Post-op complete heart block in 8/14, resolved.   3. LHC (6/14) with no significant coronary artery disease.   4. Right femoral hernia repair 8/14.     ASSESSMENT:    1. Tachycardia   2. S/P minimally-invasive mitral valve replacement with mechanical valve and tricuspid valve repair   3. Complete heart block (HCC)   4. Sinus tachycardia (HCC)   5. Subtherapeutic international normalized ratio (INR)      PLAN:  In order of problems listed above: Patient has sinus tachycardia when she walks in here today. She says she can feel it but it is not bothersome. I think she's been having it for a while. She does not drink excess of caffeine. We'll place monitor on her to make sure she does not sustain tachycardia. She does have history of complete heart block so I'm reluctant to put her on anything at this time. Follow-up with Dr. Sherlie Ban after she wears her monitor.   She has history of MVR/TVR and had recent hospitalization for influenza. Her INR was subtherapeutic. She could not afford Lovenox bridge so did not do it. She is going to the Coumadin clinic now.     Medication Adjustments/Labs and Tests Ordered: Current medicines are reviewed at length with the patient today.  Concerns regarding medicines are outlined above.  Medication changes,  Labs and Tests ordered today are listed in the Patient Instructions below. Patient Instructions  Medication Instructions:   Your physician recommends that you continue on your current medications as directed. Please refer to the Current Medication list given to you today.]  If you need a refill on your cardiac medications before your next  appointment, please call your pharmacy.  Labwork: NONE ORDER TODAY   Testing/Procedures:  Your physician has recommended that you wear an event monitor. Event monitors are medical devices that record the heart's electrical activity. Doctors most often [place] these monitors to diagnose arrhythmias. Arrhythmias are problems with the speed or rhythm of the heartbeat. The monitor is a small, portable device. You can wear one while you do your normal daily activities. This is usually used to diagnose what is causing palpitations/syncope (passing out).   Follow-Up:  In 4 to 6 WEEKS WITH DR [facility] ONLY PER Geroge Gilliam    Any Other Special Instructions Will Be Listed Below (If Applicable).                                                                                                                                                       Elson Clan, PA-C  02/01/2016 8:42 AM    [facility] [address] [place], [place], [place]  [address] Phone: [phone]; Fax: [phone]

## 2016-02-01 NOTE — Patient Instructions (Addendum)
Medication Instructions:   Your physician recommends that you continue on your current medications as directed. Please refer to the Current Medication list given to you today.]  If you need a refill on your cardiac medications before your next appointment, please call your pharmacy.  Labwork: NONE ORDER TODAY   Testing/Procedures:  Your physician has recommended that you wear an event monitor. Event monitors are medical devices that record the heart's electrical activity. Doctors most often [place] these monitors to diagnose arrhythmias. Arrhythmias are problems with the speed or rhythm of the heartbeat. The monitor is a small, portable device. You can wear one while you do your normal daily activities. This is usually used to diagnose what is causing palpitations/syncope (passing out).   Follow-Up:  In 4 to 6 WEEKS WITH DR [facility] ONLY PER [place]    Any Other Special Instructions Will Be Listed Below (If Applicable).

## 2016-02-08 ENCOUNTER — Encounter: Payer: Self-pay | Admitting: *Deleted

## 2016-02-08 NOTE — Progress Notes (Signed)
Patient ID: Mary [name], female   DOB: 1961/01/07, 55 y.o.   MRN: [id number] Patient did not show up for 02/08/16, 9:00 AM, appointment to have a cardiac event monitor applied.

## 2016-02-16 ENCOUNTER — Ambulatory Visit (INDEPENDENT_AMBULATORY_CARE_PROVIDER_SITE_OTHER): Payer: Medicaid Other | Admitting: *Deleted

## 2016-02-16 DIAGNOSIS — I059 Rheumatic mitral valve disease, unspecified: Secondary | ICD-10-CM

## 2016-02-16 DIAGNOSIS — Z7901 Long term (current) use of anticoagulants: Secondary | ICD-10-CM | POA: Diagnosis not present

## 2016-02-16 DIAGNOSIS — Z954 Presence of other heart-valve replacement: Secondary | ICD-10-CM

## 2016-02-16 DIAGNOSIS — Z952 Presence of prosthetic heart valve: Secondary | ICD-10-CM

## 2016-02-16 LAB — POCT INR: INR: 1.5

## 2016-02-18 ENCOUNTER — Encounter: Payer: Self-pay | Admitting: *Deleted

## 2016-02-18 NOTE — Progress Notes (Signed)
Patient ID: Mary [name], female   DOB: 08-22-1961, 55 y.o.   MRN: [id number] Patient did not show up for 02/08/16 or 02/18/16, 9:00 AM, appointment to have a 30 day cardiac event monitor applied.  Patient is scheduled to follow up with Dr. Aundra Dubin 03/18/16.

## 2016-02-18 NOTE — Progress Notes (Signed)
LMTCB earlier in the day to discuss appointments

## 2016-02-23 ENCOUNTER — Ambulatory Visit (INDEPENDENT_AMBULATORY_CARE_PROVIDER_SITE_OTHER): Payer: Medicaid Other | Admitting: *Deleted

## 2016-02-23 DIAGNOSIS — Z7901 Long term (current) use of anticoagulants: Secondary | ICD-10-CM | POA: Diagnosis not present

## 2016-02-23 DIAGNOSIS — Z954 Presence of other heart-valve replacement: Secondary | ICD-10-CM

## 2016-02-23 DIAGNOSIS — Z952 Presence of prosthetic heart valve: Secondary | ICD-10-CM

## 2016-02-23 DIAGNOSIS — I059 Rheumatic mitral valve disease, unspecified: Secondary | ICD-10-CM

## 2016-02-23 LAB — POCT INR: INR: 2.7

## 2016-02-23 NOTE — Progress Notes (Signed)
Pt rescheduled 30 day monitor for 02/24/16.

## 2016-02-24 ENCOUNTER — Ambulatory Visit (INDEPENDENT_AMBULATORY_CARE_PROVIDER_SITE_OTHER): Payer: Medicaid Other

## 2016-02-24 DIAGNOSIS — R000 Tachycardia, unspecified: Secondary | ICD-10-CM | POA: Diagnosis not present

## 2016-03-18 ENCOUNTER — Ambulatory Visit (INDEPENDENT_AMBULATORY_CARE_PROVIDER_SITE_OTHER): Payer: Medicaid Other | Admitting: *Deleted

## 2016-03-18 ENCOUNTER — Ambulatory Visit (INDEPENDENT_AMBULATORY_CARE_PROVIDER_SITE_OTHER): Payer: Medicaid Other | Admitting: Cardiology

## 2016-03-18 ENCOUNTER — Encounter: Payer: Self-pay | Admitting: Cardiology

## 2016-03-18 VITALS — BP 122/62 | HR 109 | Ht 62.0 in | Wt 139.0 lb

## 2016-03-18 DIAGNOSIS — Z954 Presence of other heart-valve replacement: Secondary | ICD-10-CM | POA: Diagnosis not present

## 2016-03-18 DIAGNOSIS — Z952 Presence of prosthetic heart valve: Secondary | ICD-10-CM

## 2016-03-18 DIAGNOSIS — Z7901 Long term (current) use of anticoagulants: Secondary | ICD-10-CM

## 2016-03-18 DIAGNOSIS — R002 Palpitations: Secondary | ICD-10-CM | POA: Diagnosis not present

## 2016-03-18 DIAGNOSIS — I059 Rheumatic mitral valve disease, unspecified: Secondary | ICD-10-CM | POA: Diagnosis not present

## 2016-03-18 LAB — POCT INR: INR: 3.5

## 2016-03-18 MED ORDER — METOPROLOL SUCCINATE ER 25 MG PO TB24: 25.0000 mg | ORAL_TABLET | Freq: Every day | ORAL | Status: DC

## 2016-03-18 NOTE — Patient Instructions (Signed)
Medication Instructions:  Your physician has recommended you make the following change in your medication:  1) START Toprol 25 mg daily  Labwork: None ordered  Testing/Procedures: None ordered  Follow-Up: Your physician recommends that you schedule a follow-up appointment in: 6 weeks with Richardson Dopp, PA.  If you need a refill on your cardiac medications before your next appointment, please call your pharmacy. Thank you for choosing [facility]!!     Any Other Special Instructions Will Be Listed Below (If Applicable). Metoprolol extended-release tablets What is this medicine? METOPROLOL (me TOE proe lole) is a beta-blocker. Beta-blockers reduce the workload on the heart and help it to beat more regularly. This medicine is used to treat high blood pressure and to prevent chest pain. It is also used to after a heart attack and to prevent an additional heart attack from occurring. This medicine may be used for other purposes; ask your health care provider or pharmacist if you have questions. What should I tell my health care provider before I take this medicine? They need to know if you have any of these conditions: -diabetes -heart or vessel disease like slow heart rate, worsening heart failure, heart block, sick sinus syndrome or Raynaud's disease -kidney disease -liver disease -lung or breathing disease, like asthma or emphysema -pheochromocytoma -thyroid disease -an unusual or allergic reaction to metoprolol, other beta-blockers, medicines, foods, dyes, or preservatives -pregnant or trying to get pregnant -breast-feeding How should I use this medicine? Take this medicine by mouth with a glass of water. Follow the directions on the prescription label. Do not crush or chew. Take this medicine with or immediately after meals. Take your doses at regular intervals. Do not take more medicine than directed. Do not stop taking this medicine suddenly. This could lead to serious  heart-related effects. Talk to your pediatrician regarding the use of this medicine in children. While this drug may be prescribed for children as young as 6 years for selected conditions, precautions do apply. Overdosage: If you think you have taken too much of this medicine contact a poison control center or emergency room at once. NOTE: This medicine is only for you. Do not share this medicine with others. What if I miss a dose? If you miss a dose, take it as soon as you can. If it is almost time for your next dose, take only that dose. Do not take double or extra doses. What may interact with this medicine? This medicine may interact with the following medications: -certain medicines for blood pressure, heart disease, irregular heart beat -certain medicines for depression, like monoamine oxidase (MAO) inhibitors, fluoxetine, or paroxetine -clonidine -dobutamine -epinephrine -isoproterenol -reserpine This list may not describe all possible interactions. Give your health care provider a list of all the medicines, herbs, non-prescription drugs, or dietary supplements you use. Also tell them if you smoke, drink alcohol, or use illegal drugs. Some items may interact with your medicine. What should I watch for while using this medicine? Visit your doctor or health care professional for regular check ups. Contact your doctor right away if your symptoms worsen. Check your blood pressure and pulse rate regularly. Ask your health care professional what your blood pressure and pulse rate should be, and when you should contact them. You may get drowsy or dizzy. Do not drive, use machinery, or do anything that needs mental alertness until you know how this medicine affects you. Do not sit or stand up quickly, especially if you are an older patient. This  reduces the risk of dizzy or fainting spells. Contact your doctor if these symptoms continue. Alcohol may interfere with the effect of this medicine. Avoid  alcoholic drinks. What side effects may I notice from receiving this medicine? Side effects that you should report to your doctor or health care professional as soon as possible: -allergic reactions like skin rash, itching or hives -cold or numb hands or feet -depression -difficulty breathing -faint -fever with sore throat -irregular heartbeat, chest pain -rapid weight gain -swollen legs or ankles Side effects that usually do not require medical attention (report to your doctor or health care professional if they continue or are bothersome): -anxiety or nervousness -change in sex drive or performance -dry skin -headache -nightmares or trouble sleeping -short term memory loss -stomach upset or diarrhea -unusually tired This list may not describe all possible side effects. Call your doctor for medical advice about side effects. You may report side effects to FDA at 1-800-FDA-1088. Where should I keep my medicine? Keep out of the reach of children. Store at room temperature between 15 and 30 degrees C (59 and 86 degrees F). Throw away any unused medicine after the expiration date. NOTE: This sheet is a summary. It may not cover all possible information. If you have questions about this medicine, talk to your doctor, pharmacist, or health care provider.    2016, Elsevier/Gold Standard. (2013-05-17 14:41:37)

## 2016-03-20 ENCOUNTER — Telehealth: Payer: Self-pay | Admitting: Internal Medicine

## 2016-03-20 DIAGNOSIS — R002 Palpitations: Secondary | ICD-10-CM | POA: Insufficient documentation

## 2016-03-20 NOTE — Telephone Encounter (Signed)
Received call from [place] that patient had a 3.5 sec pause while sleeping.  Patient contacted and claims to have possibly felt her heart racing (per lifewatch no SVT just borderline sinus tachycardia).  No high risk features, will forward findings to ordering team.

## 2016-03-20 NOTE — Progress Notes (Addendum)
Patient ID: Mary [name], female   DOB: [birth year], 55 y.o.   MRN: [id number]  55 yo presents for followup after mechanical mitral valve replacement and tricuspid valve repair.  Patient was in [place] in 6/14 with severe MR and endocarditis. In 8/14, she had right mini-thoracotomy with mechanical MVR and TV repair.  Post-op course was complicated by complete heart block that recovered without needing a PCM.  Post-op echo in 9/14 showed normal EF and a well-seated mechanical mitral valve.    She is able to walk to the bus stop (about 1/4 mile) without dyspnea.  No chest pain.  She has had sinus tachycardia (mild) chronically and feels palpitations at times.  She is wearing an event monitor currently.  So far, this has showed sinus tachycardia with occasional PVCs.  No melena, occasional hemorrhoidal bleeding.  No lightheadedness.    Labs (9/14): K 3.7, creatinine 0.96 Labs (4/17): K 3.7, creatinine 0.92, HCT 35.4  PMH: 1. 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  In 8/14, had right mini-thoracotomy with mechanical mitral valve replacement (St Jude) + tricuspid valve repair.   - Echo (9/14): EF 55-60%, well-seated mechanical mitral valve. - Echo (2/17): EF 55-60%, mechanical mitral valve looked ok, RV normal size/systolic function.  2. Post-op complete heart block in 8/14, resolved.  3. LHC (6/14) with no significant coronary artery disease.  4. Right femoral hernia repair 8/14.  5. TIA: INR was low.  Carotid dopplers (2/17) with no significant plaque.  6. Palpitations: Event monitor (6/17) with sinus tachycardia, PVCs.   SH: Lives in [place], works in a [employer], nonsmoker, 2 sons/1 daughter.   FH: No premature CAD  ROS: All systems reviewed and negative except as per HPI.   Current Outpatient Prescriptions  Medication Sig Dispense Refill  . acetaminophen (TYLENOL) 500 MG tablet Take 500 mg by mouth every 6 (six) hours as needed for mild pain or moderate  pain.    [name] aspirin EC 81 MG tablet Take 1 tablet (81 mg total) by mouth daily.    [name] warfarin (COUMADIN) 5 MG tablet Take 5 mg by mouth as directed. 7 mg on Monday and Wednesday, all other days 5 mg    . WITCH HAZEL EX Apply 1 application topically at bedtime as needed (knee pain).     . metoprolol succinate (TOPROL-XL) 25 MG 24 hr tablet Take 1 tablet (25 mg total) by mouth daily. Take with or immediately following a meal. 30 tablet 3   No current facility-administered medications for this visit.    BP 122/62 mmHg  Pulse 109  Ht 5\' 2"  (1.575 m)  Wt 139 lb (63.05 kg)  BMI 25.42 kg/m2  SpO2 99%  LMP 01/28/2013 General: NAD Neck: No JVD, no thyromegaly or thyroid nodule.  Lungs: Clear to auscultation bilaterally with normal respiratory effort. CV: Nondisplaced PMI.  Heart mildly tachycardic, regular S1/S2, no S3/S4, mechanical S2, 1/6 SEM.  No peripheral edema.  No carotid bruit.  Normal pedal pulses.  Abdomen: Soft, nontender, no hepatosplenomegaly, no distention.  Neurologic: Alert and oriented x 3.  Psych: Normal affect. Extremities: No clubbing or cyanosis.   Assessment/Plan: 1. Palpitations: Patient has had mild sinus tachycardia chronically.  Event monitor that she is wearing currently has showed occasional PVCs but no other arrhythmias.  She does feel palpitations at times. - Toprol XL 25 mg daily to see if this helps with the sensation of racing HR.  If she does not tolerate Toprol XL, could  try ivabradine if we can get insurance approval.  She had brief complete heart block soon post-op valve surgery, but this resolved completely and she has been mildly tachycardic since then.  Suspect beta blocker will not be a high risk intervention.  2. Mechanical MVR/tricuspid valve repair:  Echo in 2/17 showed stable valves.  She will need to continue warfarin with goal INR 2.5-3.5.  Continue ASA 81 mg daily.  She will need endocarditis prophylaxis with dental work.  3. H/o TIA: In setting of  subtherapeutic INR.     Followup in 6 weeks with PA [name] to see if symptoms are improved with Toprol XL.   Loralie Champagne 03/20/2016

## 2016-03-21 NOTE — Telephone Encounter (Signed)
Would stop the Toprol given pauses.  She has had mild sinus tachy for a long time, so do not think that beta blocker is vital, was just giving it b/c she has occasional palpitations. 3.5 second pause while asleep is not significant enough for pacemaker. Would continue to monitor.

## 2016-03-21 NOTE — Telephone Encounter (Signed)
Patient had 3.5 sec pause on monitor. Stop toprol for now.

## 2016-03-23 ENCOUNTER — Telehealth: Payer: Self-pay | Admitting: *Deleted

## 2016-03-23 NOTE — Telephone Encounter (Signed)
Finally got pt today re: 3.5 second pause with her lifewatch.  Pt has been advised to stop the Toprol given pauses. She has had mild sinus tachy for a long time, so do not think that beta blocker is vital, was just giving it b/c she has occasional palpitations. 3.5 second pause while asleep is not significant enough for pacemaker. Would continue to monitor.  Pt agreeable with this plan.

## 2016-03-23 NOTE — Telephone Encounter (Signed)
See phone note dated 03/23/16

## 2016-03-23 NOTE — Telephone Encounter (Signed)
I placed call to pt but voicemail not set up yet

## 2016-04-18 ENCOUNTER — Ambulatory Visit: Payer: Self-pay | Admitting: Cardiology

## 2016-04-21 ENCOUNTER — Encounter: Payer: Self-pay | Admitting: Physician Assistant

## 2016-04-21 NOTE — Progress Notes (Deleted)
Cardiology Office Note:    Date:  04/21/2016   ID:  Mary [name], DOB Oct 11, 1960, MRN [id number]  PCP:  Loralie Champagne, MD  Cardiologist:  Dr. Loralie Champagne   Electrophysiologist:  Dr. Cristopher Peru   Referring MD: Larey Dresser, MD   No chief complaint on file.   History of Present Illness:    Mary [name] is a 55 y.o. 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 She underwent R mini-thoracotomy with mechanical MVR and TV repair. Post op course c/b CHB that recovered without needing a PPM. She was kept off all AVN blocking agents. She wore a Holter monitor in 7/15 and was noted to have asymptomatic CHB while sleeping. She saw Dr. Cristopher Peru. Pacemaker was not recommended as CHB only occurred during sleep.   I saw her in 4/16 with complaints of palpitations and near syncope. Event Monitor was arranged but never done. Echocardiogram 12/2014 demonstrated normal LV function, normally functioning mechanical mitral valve. There was a small mobile echodensity attached to valvular ring. However, I reviewed this with Dr. Johnsie Cancel. It was felt to be related to redundant cord or suture from surgery.  Admitted in 12/16 with L sided weakness concerning for TIA. MRI and CT were neg for acute CVA.  Patient left AMA before further testing could be done.   Of note, INR was less than 2.5 (2.23; 2.06).  ***  Past Medical History:  Diagnosis Date  . Acute diastolic heart failure ([place]) 03/15/2013  . Anemia   . Asthma    "as a baby"   . Carotid stenosis    Carotid US 2/17: bilateral ICA 1-39% >> FU prn  . Complete heart block ([place]) 05/28/2013   Post-op  . Epigastric hernia 200's  . GERD (gastroesophageal reflux disease)   . History of blood transfusion    "14 w/1st pregnancy; 2 w/last C-section" (03/15/2013)  . Hx of echocardiogram    a. Echo 4/16:  Mild focal basal septal hypertrophy, EF 60-65%, no RWMA, Mechanical MVR ok with mild central regurgitation and no  perivalvular leak, small mobile density attached to valvular ring (1.5x1 cm) - post surgical changes vs SBE, mild LAE;   b. Echo 2/17:  Mild post wall LVH, EF 55-60%, no RWMA, mechanical MVR ok (mean 5 mmHg), normal RVSF  . Hypertension    no pcp   will go to [facility]  . 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 S/P mitral valve replacement with metallic valve [id number]   21mm Sorin Carbomedics mechanical prosthesis via right mini thoracotomy approach  . 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   Complex valvuloplasty including Cor-matrix ECM patch augmentation of anterior and lateral leaflets with 61mm Edwards mc3 ring annuloplasty via right mini-thoracotomy approach  . Severe mitral regurgitation 04/22/2013  . SVT (supraventricular tachycardia) ([place]) 03/16/2013  . Tricuspid regurgitation   1. 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  In 8/14, had right mini-thoracotomy with mechanical mitral valve replacement (St Jude) + tricuspid valve repair.   - Echo (9/14): EF 55-60%, well-seated mechanical mitral valve. - Echo (2/17): EF 55-60%, mechanical mitral valve looked ok, RV normal size/systolic function.  2. Post-op complete heart block in 8/14, resolved.  3. LHC (6/14) with no significant coronary artery disease.  4. Right femoral hernia repair 8/14.  5. TIA: INR was low.  Carotid dopplers (2/17) with no significant plaque.  6. Palpitations: Event monitor (6/17) with sinus tachycardia, PVCs.  Past Surgical History:  Procedure Laterality Date  . APPENDECTOMY  [id number]  . CARDIAC CATHETERIZATION    . [place]; 1999  . CESAREAN SECTION WITH BILATERAL TUBAL LIGATION  1999  . FEMORAL HERNIA REPAIR Right 05/23/2013   Procedure: HERNIA REPAIR FEMORAL;  Surgeon: Rexene Alberts, MD;  Location: [place];  Service: Open Heart Surgery;  Laterality: Right;  . INTRAOPERATIVE TRANSESOPHAGEAL ECHOCARDIOGRAM N/A 05/23/2013   Procedure: INTRAOPERATIVE TRANSESOPHAGEAL  ECHOCARDIOGRAM;  Surgeon: Rexene Alberts, MD;  Location: [place];  Service: Open Heart Surgery;  Laterality: N/A;  . LAPAROSCOPIC CHOLECYSTECTOMY  2003  . LEFT AND RIGHT HEART CATHETERIZATION WITH CORONARY ANGIOGRAM N/A 03/22/2013   Procedure: LEFT AND RIGHT HEART CATHETERIZATION WITH CORONARY ANGIOGRAM;  Surgeon: Burnell Blanks, MD;  Location: [facility] CATH LAB;  Service: Cardiovascular;  Laterality: N/A;  . MINIMALLY INVASIVE TRICUSPID VALVE REPAIR Right 05/23/2013   Procedure: MINIMALLY INVASIVE TRICUSPID VALVE REPAIR;  Surgeon: Rexene Alberts, MD;  Location: [place];  Service: Open Heart Surgery;  Laterality: Right;  . MITRAL VALVE REPLACEMENT N/A 05/23/2013   Procedure: MITRAL VALVE (MV) REPLACEMENT;  Surgeon: Rexene Alberts, MD;  Location: [place];  Service: Open Heart Surgery;  Laterality: N/A;  . MULTIPLE EXTRACTIONS WITH ALVEOLOPLASTY N/A 04/04/2013   Procedure: Extraction of tooth #'s 1,8,9,13,14,15,23,24,25,26 with alveoloplasty and gross debridement of remaining teeth;  Surgeon: Lenn Cal, DDS;  Location: [place];  Service: Oral Surgery;  Laterality: N/A;  . TEE WITHOUT CARDIOVERSION N/A 03/18/2013   Procedure: TRANSESOPHAGEAL ECHOCARDIOGRAM (TEE);  Surgeon: Larey Dresser, MD;  Location: [place];  Service: Cardiovascular;  Laterality: N/A;  . TEE WITHOUT CARDIOVERSION N/A 06/17/2013   Procedure: TRANSESOPHAGEAL ECHOCARDIOGRAM (TEE);  Surgeon: Lelon Perla, MD;  Location: [facility] ENDOSCOPY;  Service: Cardiovascular;  Laterality: N/A;    Current Medications: Outpatient Medications Prior to Visit  Medication Sig Dispense Refill  . acetaminophen (TYLENOL) 500 MG tablet Take 500 mg by mouth every 6 (six) hours as needed for mild pain or moderate pain.    [name] aspirin EC 81 MG tablet Take 1 tablet (81 mg total) by mouth daily.    . metoprolol succinate (TOPROL-XL) 25 MG 24 hr tablet Take 1 tablet (25 mg total) by mouth daily. Take with or immediately following a meal. 30 tablet 3  .  warfarin (COUMADIN) 5 MG tablet Take 5 mg by mouth as directed. 7 mg on Monday and Wednesday, all other days 5 mg    . WITCH HAZEL EX Apply 1 application topically at bedtime as needed (knee pain).      No facility-administered medications prior to visit.       Allergies:   Oxycodone   Social History   Social History  . Marital status: Single    Spouse name: N/A  . Number of children: N/A  . Years of education: N/A   Social History Main Topics  . Smoking status: Former Smoker    Packs/day: 0.50    Years: 36.00    Types: Cigarettes    Quit date: 03/15/2013  . Smokeless tobacco: Never Used  . Alcohol use 3.6 oz/week    6 Glasses of wine per week     Comment: 03/15/2013 "bottle of wine/wk"  . Drug use: No  . Sexual activity: No   Other Topics Concern  . Not on file   Social History Narrative  . No narrative on file     Family History:  The patient's ***family history includes Cancer  in her mother; Sarcoidosis in her sister; Stroke in her father.   ROS:   Please see the history of present illness.    ROS All other systems reviewed and are negative.   Physical Exam:    VS:  LMP 01/28/2013     Wt Readings from Last 3 Encounters:  03/18/16 139 lb (63 kg)  02/01/16 137 lb 12.8 oz (62.5 kg)  01/10/16 134 lb 14.7 oz (61.2 kg)     ***Physical Exam  Studies/Labs Reviewed:   EKG:  EKG is *** ordered today.  The ekg ordered today demonstrates ***  Recent Labs: 01/10/2016: ALT 29; TSH 1.725 01/11/2016: BUN 7; Creatinine, Ser 0.92; Magnesium 1.6; Potassium 3.7; Sodium 141 01/13/2016: Hemoglobin 12.0; Platelets 300   Recent Lipid Panel    Component Value Date/Time   CHOL 229 (H) 09/13/2015 0545   TRIG 78 09/13/2015 0545   HDL 77 09/13/2015 0545   CHOLHDL 3.0 09/13/2015 0545   VLDL 16 09/13/2015 0545   LDLCALC 136 (H) 09/13/2015 0545    Additional studies/ records that were reviewed today include:   *** Event monitor 5/17 1 episode of high grade heart block at  night.  Otherwise, mild sinus tachycardia.    Carotid US 2/17 Carotid US 2/17: bilateral ICA 1-39% >> FU prn  Echo 11/03/15 Posterior wall mild LVH, EF 55-60%, normal wall motion, St. Jude mechanical MVR okay, mean gradient 5 mmHg  MRI/MRA Head/Neck 09/13/15 IMPRESSION: MRI HEAD IMPRESSION: 1. No acute intracranial infarct or other process identified. 2. Probable tiny remote lacunar infarct within the left paramedianpons. 3. Otherwise brain MRI. MRA HEAD IMPRESSION: Normal intracranial MRA. No larger part for tear branch occlusion.  No high-grade or correctable stenosis.  Echo 01/01/15 Mild focal basal septal hypertrophy, EF 60-65%, normal wall motion, S/P MVR (St Jude mechanical valve) functioning well, mild MR (small mobile echodensity-likely redundant chord versus suture), mild LAE, TV mean gradient 4 mmHg.   Echo 7/15 Mild LVH, EF 60-65%, normal wall motion, grade 1 diastolic dysfunction, S/P MVR (St Jude mechanical valve) and appears functioning well, mild MR, no perivalvular leak, mild LAE, mildly reduced RVSF.   Carotid US 8/14 Bilateral - 1% to 39% ICA stenosis   Cardiac cath 6/14 Left main: No obstructive disease.  Left Anterior Descending Artery: No obstructive disease noted.  Circumflex Artery: No obstructive disease.  Right Coronary Artery: no obstructive disease.  Left Ventricular Angiogram: LVEF 65-70%. 3+ MR Distal Aortogram: No aneurysm. No stenosis.   ASSESSMENT:    1. Sinus tachycardia ([place])   2. S/P minimally-invasive mitral valve replacement with mechanical valve and tricuspid valve repair   3. History of TIA (transient ischemic attack)    PLAN:    In order of problems listed above:  1. *** Assessment/Plan: 1. Palpitations: Patient has had mild sinus tachycardia chronically.  Event monitor that she is wearing currently has showed occasional PVCs but no other arrhythmias.  She does feel palpitations at times. - Toprol XL 25 mg daily to see if  this helps with the sensation of racing HR.  If she does not tolerate Toprol XL, could try ivabradine if we can get insurance approval.  She had brief complete heart block soon post-op valve surgery, but this resolved completely and she has been mildly tachycardic since then.  Suspect beta blocker will not be a high risk intervention.  2. Mechanical MVR/tricuspid valve repair:  Echo in 2/17 showed stable valves.  She will need to continue warfarin with goal INR 2.5-3.5.  Continue ASA 81 mg daily.  She will need endocarditis prophylaxis with dental work.  3. H/o TIA: In setting of subtherapeutic INR.     Medication Adjustments/Labs and Tests Ordered: Current medicines are reviewed at length with the patient today.  Concerns regarding medicines are outlined above.  Medication changes, Labs and Tests ordered today are outlined in the Patient Instructions noted below. There are no Patient Instructions on file for this visit. Signed, Richardson Dopp, PA-C  04/21/2016 5:19 PM    [facility] [address], [place], [place]  [address] Phone: [phone]; Fax: [phone]

## 2016-04-25 ENCOUNTER — Ambulatory Visit: Payer: Self-pay | Admitting: Physician Assistant

## 2016-04-29 ENCOUNTER — Ambulatory Visit: Payer: Self-pay | Admitting: Physician Assistant

## 2016-05-04 ENCOUNTER — Encounter: Payer: Self-pay | Admitting: Physician Assistant

## 2016-06-03 ENCOUNTER — Other Ambulatory Visit: Payer: Self-pay | Admitting: *Deleted

## 2016-06-03 MED ORDER — WARFARIN SODIUM 5 MG PO TABS: ORAL_TABLET | ORAL | 0 refills | Status: DC

## 2016-06-05 ENCOUNTER — Encounter (HOSPITAL_COMMUNITY): Payer: Self-pay

## 2016-06-05 ENCOUNTER — Other Ambulatory Visit: Payer: Self-pay | Admitting: Internal Medicine

## 2016-06-05 ENCOUNTER — Observation Stay (HOSPITAL_COMMUNITY): Payer: Medicaid Other

## 2016-06-05 ENCOUNTER — Inpatient Hospital Stay (HOSPITAL_COMMUNITY)
Admission: EM | Admit: 2016-06-05 | Discharge: 2016-06-09 | DRG: 069 | Disposition: A | Discharge status: Home / Self Care | Payer: Medicaid Other | Attending: Internal Medicine | Admitting: Internal Medicine

## 2016-06-05 ENCOUNTER — Emergency Department (HOSPITAL_COMMUNITY): Payer: Medicaid Other

## 2016-06-05 DIAGNOSIS — Z954 Presence of other heart-valve replacement: Secondary | ICD-10-CM

## 2016-06-05 DIAGNOSIS — Z885 Allergy status to narcotic agent status: Secondary | ICD-10-CM

## 2016-06-05 DIAGNOSIS — E871 Hypo-osmolality and hyponatremia: Secondary | ICD-10-CM | POA: Diagnosis present

## 2016-06-05 DIAGNOSIS — R0789 Other chest pain: Secondary | ICD-10-CM | POA: Diagnosis present

## 2016-06-05 DIAGNOSIS — Z8673 Personal history of transient ischemic attack (TIA), and cerebral infarction without residual deficits: Secondary | ICD-10-CM

## 2016-06-05 DIAGNOSIS — E785 Hyperlipidemia, unspecified: Secondary | ICD-10-CM | POA: Diagnosis present

## 2016-06-05 DIAGNOSIS — I639 Cerebral infarction, unspecified: Secondary | ICD-10-CM | POA: Diagnosis present

## 2016-06-05 DIAGNOSIS — Z87891 Personal history of nicotine dependence: Secondary | ICD-10-CM

## 2016-06-05 DIAGNOSIS — R29702 NIHSS score 2: Secondary | ICD-10-CM | POA: Diagnosis present

## 2016-06-05 DIAGNOSIS — Z7901 Long term (current) use of anticoagulants: Secondary | ICD-10-CM

## 2016-06-05 DIAGNOSIS — Z823 Family history of stroke: Secondary | ICD-10-CM

## 2016-06-05 DIAGNOSIS — R079 Chest pain, unspecified: Secondary | ICD-10-CM

## 2016-06-05 DIAGNOSIS — I10 Essential (primary) hypertension: Secondary | ICD-10-CM

## 2016-06-05 DIAGNOSIS — R531 Weakness: Secondary | ICD-10-CM

## 2016-06-05 DIAGNOSIS — R791 Abnormal coagulation profile: Secondary | ICD-10-CM | POA: Diagnosis present

## 2016-06-05 DIAGNOSIS — Z952 Presence of prosthetic heart valve: Secondary | ICD-10-CM

## 2016-06-05 DIAGNOSIS — R402252 Coma scale, best verbal response, oriented, at arrival to emergency department: Secondary | ICD-10-CM | POA: Diagnosis present

## 2016-06-05 DIAGNOSIS — N179 Acute kidney failure, unspecified: Secondary | ICD-10-CM | POA: Diagnosis present

## 2016-06-05 DIAGNOSIS — Z7982 Long term (current) use of aspirin: Secondary | ICD-10-CM

## 2016-06-05 DIAGNOSIS — I5032 Chronic diastolic (congestive) heart failure: Secondary | ICD-10-CM | POA: Diagnosis present

## 2016-06-05 DIAGNOSIS — G458 Other transient cerebral ischemic attacks and related syndromes: Principal | ICD-10-CM | POA: Diagnosis present

## 2016-06-05 DIAGNOSIS — I110 Hypertensive heart disease with heart failure: Secondary | ICD-10-CM | POA: Diagnosis present

## 2016-06-05 DIAGNOSIS — F1290 Cannabis use, unspecified, uncomplicated: Secondary | ICD-10-CM | POA: Diagnosis present

## 2016-06-05 DIAGNOSIS — E872 Acidosis: Secondary | ICD-10-CM | POA: Diagnosis present

## 2016-06-05 DIAGNOSIS — G459 Transient cerebral ischemic attack, unspecified: Secondary | ICD-10-CM | POA: Diagnosis present

## 2016-06-05 DIAGNOSIS — R402362 Coma scale, best motor response, obeys commands, at arrival to emergency department: Secondary | ICD-10-CM | POA: Diagnosis present

## 2016-06-05 DIAGNOSIS — R402142 Coma scale, eyes open, spontaneous, at arrival to emergency department: Secondary | ICD-10-CM | POA: Diagnosis present

## 2016-06-05 DIAGNOSIS — K219 Gastro-esophageal reflux disease without esophagitis: Secondary | ICD-10-CM | POA: Diagnosis present

## 2016-06-05 LAB — CBG MONITORING, ED: GLUCOSE-CAPILLARY: 94 mg/dL (ref 65–99)

## 2016-06-05 LAB — URINALYSIS, ROUTINE W REFLEX MICROSCOPIC
Bilirubin Urine: NEGATIVE
Glucose, UA: NEGATIVE mg/dL
Ketones, ur: NEGATIVE mg/dL
Nitrite: NEGATIVE
PH: 5.5 (ref 5.0–8.0)
Protein, ur: 30 mg/dL — AB
SPECIFIC GRAVITY, URINE: 1.012 (ref 1.005–1.030)

## 2016-06-05 LAB — RAPID URINE DRUG SCREEN, HOSP PERFORMED
AMPHETAMINES: NOT DETECTED
BENZODIAZEPINES: NOT DETECTED
Barbiturates: NOT DETECTED
COCAINE: NOT DETECTED
OPIATES: NOT DETECTED
Tetrahydrocannabinol: POSITIVE — AB

## 2016-06-05 LAB — CBC
HCT: 38.8 % (ref 36.0–46.0)
Hemoglobin: 13.9 g/dL (ref 12.0–15.0)
MCH: 34.9 pg — ABNORMAL HIGH (ref 26.0–34.0)
MCHC: 35.8 g/dL (ref 30.0–36.0)
MCV: 97.5 fL (ref 78.0–100.0)
Platelets: 375 K/uL (ref 150–400)
RBC: 3.98 MIL/uL (ref 3.87–5.11)
RDW: 13.6 % (ref 11.5–15.5)
WBC: 10.4 K/uL (ref 4.0–10.5)

## 2016-06-05 LAB — COMPREHENSIVE METABOLIC PANEL
ALT: 13 U/L — AB (ref 14–54)
AST: 29 U/L (ref 15–41)
Albumin: 3.2 g/dL — ABNORMAL LOW (ref 3.5–5.0)
Alkaline Phosphatase: 111 U/L (ref 38–126)
Anion gap: 12 (ref 5–15)
BUN: 9 mg/dL (ref 6–20)
CHLORIDE: 97 mmol/L — AB (ref 101–111)
CO2: 16 mmol/L — ABNORMAL LOW (ref 22–32)
CREATININE: 1.44 mg/dL — AB (ref 0.44–1.00)
Calcium: 9 mg/dL (ref 8.9–10.3)
GFR, EST AFRICAN AMERICAN: 47 mL/min — AB (ref >60)
GFR, EST NON AFRICAN AMERICAN: 41 mL/min — AB (ref >60)
Glucose, Bld: 91 mg/dL (ref 65–99)
POTASSIUM: 4.3 mmol/L (ref 3.5–5.1)
Sodium: 125 mmol/L — ABNORMAL LOW (ref 135–145)
Total Bilirubin: 0.4 mg/dL (ref 0.3–1.2)
Total Protein: 6.2 g/dL — ABNORMAL LOW (ref 6.5–8.1)

## 2016-06-05 LAB — I-STAT CHEM 8, ED
BUN: 10 mg/dL (ref 6–20)
CALCIUM ION: 1.06 mmol/L — AB (ref 1.15–1.40)
Chloride: 100 mmol/L — ABNORMAL LOW (ref 101–111)
Creatinine, Ser: 1.4 mg/dL — ABNORMAL HIGH (ref 0.44–1.00)
GLUCOSE: 87 mg/dL (ref 65–99)
HCT: 43 % (ref 36.0–46.0)
HEMOGLOBIN: 14.6 g/dL (ref 12.0–15.0)
Potassium: 4.4 mmol/L (ref 3.5–5.1)
Sodium: 127 mmol/L — ABNORMAL LOW (ref 135–145)
TCO2: 19 mmol/L (ref 0–100)

## 2016-06-05 LAB — URINE MICROSCOPIC-ADD ON

## 2016-06-05 LAB — DIFFERENTIAL
BASOS PCT: 0 %
Basophils Absolute: 0 10*3/uL (ref 0.0–0.1)
EOS ABS: 0.2 10*3/uL (ref 0.0–0.7)
Eosinophils Relative: 2 %
Lymphocytes Relative: 14 %
Lymphs Abs: 1.5 10*3/uL (ref 0.7–4.0)
MONO ABS: 0.8 10*3/uL (ref 0.1–1.0)
MONOS PCT: 7 %
Neutro Abs: 7.9 10*3/uL — ABNORMAL HIGH (ref 1.7–7.7)
Neutrophils Relative %: 77 %

## 2016-06-05 LAB — PROTIME-INR
INR: 2
Prothrombin Time: 23 s — ABNORMAL HIGH (ref 11.4–15.2)

## 2016-06-05 LAB — APTT: aPTT: 40 s — ABNORMAL HIGH (ref 24–36)

## 2016-06-05 LAB — I-STAT TROPONIN, ED: Troponin i, poc: 0 ng/mL (ref 0.00–0.08)

## 2016-06-05 LAB — ETHANOL

## 2016-06-05 MED ORDER — ONDANSETRON HCL 4 MG/2ML IJ SOLN
4.0000 mg | Freq: Once | INTRAMUSCULAR | Status: AC
  Administered 2016-06-05: 4 mg via INTRAVENOUS
  Filled 2016-06-05: qty 2

## 2016-06-05 MED ORDER — ONDANSETRON HCL 4 MG/2ML IJ SOLN: 4.0000 mg | Freq: Four times a day (QID) | INTRAMUSCULAR | Status: DC | PRN

## 2016-06-05 NOTE — Consult Note (Signed)
Admission H&P    Chief Complaint: Acute onset left-sided weakness.  HPI: Mary [name] is an 55 y.o. female with a history of tricuspid and mitral valve regurgitation, mitral valve replacement, hypertension, heart block and heart failure, brought to the ED and code stroke status following acute onset of weakness and numbness involving left side. She reportedly had facial droop in addition to extremity weakness. She's been taking Coumadin for anticoagulation. INR today was 2.0. CT scan of her head showed no acute intracranial abnormality. Deficits subsequently improved. Facial droop and left upper extremity weakness resolved. He had residual left lower extremity drift and left side sensory changes at the time of this evaluation. NIH stroke score was 2. Patient also complained of chest pain. Troponin was negative.  LSN: 5:30 PM on 06/05/2016 tPA Given: No: Minimal residual deficits mRankin:  Past Medical History:  Diagnosis Date  . Acute diastolic heart failure ([place]) 03/15/2013  . Anemia   . Asthma    "as a baby"   . Carotid stenosis    Carotid US 2/17: bilateral ICA 1-39% >> FU prn  . Complete heart block ([place]) 05/28/2013   Post-op  . Epigastric hernia 200's  . GERD (gastroesophageal reflux disease)   . History of blood transfusion    "14 w/1st pregnancy; 2 w/last C-section" (03/15/2013)  . Hx of echocardiogram    a. Echo 4/16:  Mild focal basal septal hypertrophy, EF 60-65%, no RWMA, Mechanical MVR ok with mild central regurgitation and no perivalvular leak, small mobile density attached to valvular ring (1.5x1 cm) - post surgical changes vs SBE, mild LAE;   b. Echo 2/17:  Mild post wall LVH, EF 55-60%, no RWMA, mechanical MVR ok (mean 5 mmHg), normal RVSF  . Hypertension    no pcp   will go to [facility]  . 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 S/P mitral valve replacement with metallic valve 1/91/4782   49m Sorin Carbomedics mechanical prosthesis via right mini  thoracotomy approach  . 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   Complex valvuloplasty including Cor-matrix ECM patch augmentation of anterior and lateral leaflets with 2107mEdwards mc3 ring annuloplasty via right mini-thoracotomy approach  . Severe mitral regurgitation 04/22/2013  . SVT (supraventricular tachycardia) [place]/2014  . Tricuspid regurgitation     Past Surgical History:  Procedure Laterality Date  . APPENDECTOMY  ~[id number]. CARDIAC CATHETERIZATION    . [id number]  . CESAREAN SECTION WITH BILATERAL TUBAL LIGATION  1999  . FEMORAL HERNIA REPAIR Right 05/23/2013   Procedure: HERNIA REPAIR FEMORAL;  Surgeon: ClRexene AlbertsMD;  [facility] [facility] Service: Open Heart Surgery;  Laterality: Right;  . INTRAOPERATIVE TRANSESOPHAGEAL ECHOCARDIOGRAM N/A 05/23/2013   Procedure: INTRAOPERATIVE TRANSESOPHAGEAL ECHOCARDIOGRAM;  Surgeon: ClRexene AlbertsMD;  [place] [facility] Service: Open Heart Surgery;  Laterality: N/A;  . LAPAROSCOPIC CHOLECYSTECTOMY  2003  . LEFT AND RIGHT HEART CATHETERIZATION WITH CORONARY ANGIOGRAM N/A 03/22/2013   Procedure: LEFT AND RIGHT HEART CATHETERIZATION WITH CORONARY ANGIOGRAM;  Surgeon: ChBurnell BlanksMD;  [facility];  Service: Cardiovascular;  Laterality: N/A;  . MINIMALLY INVASIVE TRICUSPID VALVE REPAIR Right 05/23/2013   Procedure: MINIMALLY INVASIVE TRICUSPID VALVE REPAIR;  Surgeon: ClRexene AlbertsMD;  [place] [facility] Service: Open Heart Surgery;  Laterality: Right;  . MITRAL VALVE REPLACEMENT N/A 05/23/2013   Procedure: MITRAL VALVE (MV) REPLACEMENT;  Surgeon: ClRexene AlbertsMD;  [place] [facility] Service: Open Heart  Surgery;  Laterality: N/A;  . MULTIPLE EXTRACTIONS WITH ALVEOLOPLASTY N/A 04/04/2013   Procedure: Extraction of tooth #'s 1,8,9,13,14,15,23,24,25,26 with alveoloplasty and gross debridement of remaining teeth;  Surgeon: Lenn Cal, DDS;  Location: [place];  Service: Oral Surgery;  Laterality: N/A;   . TEE WITHOUT CARDIOVERSION N/A 03/18/2013   Procedure: TRANSESOPHAGEAL ECHOCARDIOGRAM (TEE);  Surgeon: Larey Dresser, MD;  Location: [place];  Service: Cardiovascular;  Laterality: N/A;  . TEE WITHOUT CARDIOVERSION N/A 06/17/2013   Procedure: TRANSESOPHAGEAL ECHOCARDIOGRAM (TEE);  Surgeon: Lelon Perla, MD;  Location: [facility] ENDOSCOPY;  Service: Cardiovascular;  Laterality: N/A;    Family History  Problem Relation Age of Onset  . Cancer Mother   . Stroke Father   . Sarcoidosis Sister   . Heart attack Neg Hx    Social History:  reports that she quit smoking about 3 years ago. Her smoking use included Cigarettes. She has a 18.00 pack-year smoking history. She has never used smokeless tobacco. She reports that she drinks about 3.6 oz of alcohol per week . She reports that she does not use drugs.  Allergies:  Allergies  Allergen Reactions  . Oxycodone Itching    Medications: Preadmission medications were reviewed by me.  ROS: History obtained from the patient  General ROS: negative for - chills, fatigue, fever, night sweats, weight gain or weight loss Psychological ROS: negative for - behavioral disorder, hallucinations, memory difficulties, mood swings or suicidal ideation Ophthalmic ROS: negative for - blurry vision, double vision, eye pain or loss of vision ENT ROS: negative for - epistaxis, nasal discharge, oral lesions, sore throat, tinnitus or vertigo Allergy and Immunology ROS: negative for - hives or itchy/watery eyes Hematological and Lymphatic ROS: negative for - bleeding problems, bruising or swollen lymph nodes Endocrine ROS: negative for - galactorrhea, hair pattern changes, polydipsia/polyuria or temperature intolerance Respiratory ROS: negative for - cough, hemoptysis, shortness of breath or wheezing Cardiovascular ROS: As noted in present illness Gastrointestinal ROS: negative for - abdominal pain, diarrhea, hematemesis, nausea/vomiting or stool  incontinence Genito-Urinary ROS: negative for - dysuria, hematuria, incontinence or urinary frequency/urgency Musculoskeletal ROS: negative for - joint swelling or muscular weakness Neurological ROS: as noted in HPI Dermatological ROS: negative for rash and skin lesion changes  Physical Examination: Blood pressure 135/93, pulse 98, temperature 97.8 F (36.6 C), temperature source Oral, resp. rate 17, weight 63.6 kg (140 lb 3.4 oz), last menstrual period 01/28/2013, SpO2 100 %.  HEENT-  Normocephalic, no lesions, without obvious abnormality.  Normal external eye and conjunctiva.  Normal TM's bilaterally.  Normal auditory canals and external ears. Normal external nose, mucus membranes and septum.  Normal pharynx. Neck supple with no masses, nodes, nodules or enlargement. Cardiovascular - regular rate and rhythm, mechanical valve sound noted Lungs - chest clear, no wheezing, rales, normal symmetric air entry Abdomen - soft, non-tender; bowel sounds normal; no masses,  no organomegaly Extremities - no joint deformities, effusion, or inflammation  Neurologic Examination: Mental Status: Alert, oriented, thought content appropriate.  Speech fluent without evidence of aphasia. Able to follow commands without difficulty. Cranial Nerves: II-Visual fields were normal. III/IV/VI-Pupils were equal and reacted normally to light. Extraocular movements were full and conjugate.    V/VII-no facial numbness and no facial weakness. VIII-normal. X-normal speecht. XI: trapezius strength/neck flexion strength normal bilaterally XII-midline tongue extension with normal strength. Motor: Mild drift of left lower extremity; otherwise unremarkable motor exam Sensory: Reduced perception of tactile sensation over left extremities compared to right extremities. Deep Tendon  Reflexes: 2+ and symmetric. Plantars: Mute bilaterally Cerebellar: Normal finger-to-nose testing. Carotid auscultation: Normal  Results for  orders placed or performed during the hospital encounter of 06/05/16 (from the past 48 hour(s))  CBG monitoring, ED     Status: None   Collection Time: 06/05/16  8:13 PM  Result Value Ref Range   Glucose-Capillary 94 65 - 99 mg/dL   Comment 1 Notify RN    Comment 2 Document in Chart   Ethanol     Status: None   Collection Time: 06/05/16  8:17 PM  Result Value Ref Range   Alcohol, Ethyl (B) <5 <5 mg/dL    Comment:        LOWEST DETECTABLE LIMIT FOR SERUM ALCOHOL IS 5 mg/dL FOR MEDICAL PURPOSES ONLY   Protime-INR     Status: Abnormal   Collection Time: 06/05/16  8:17 PM  Result Value Ref Range   Prothrombin Time 23.0 (H) 11.4 - 15.2 seconds   INR 2.00   APTT     Status: Abnormal   Collection Time: 06/05/16  8:17 PM  Result Value Ref Range   aPTT 40 (H) 24 - 36 seconds    Comment:        IF BASELINE aPTT IS ELEVATED, SUGGEST PATIENT RISK ASSESSMENT BE USED TO DETERMINE APPROPRIATE ANTICOAGULANT THERAPY.   CBC     Status: Abnormal   Collection Time: 06/05/16  8:17 PM  Result Value Ref Range   WBC 10.4 4.0 - 10.5 K/uL   RBC 3.98 3.87 - 5.11 MIL/uL   Hemoglobin 13.9 12.0 - 15.0 g/dL   HCT 38.8 36.0 - 46.0 %   MCV 97.5 78.0 - 100.0 fL   MCH 34.9 (H) 26.0 - 34.0 pg   MCHC 35.8 30.0 - 36.0 g/dL   RDW 13.6 11.5 - 15.5 %   Platelets 375 150 - 400 K/uL  Differential     Status: Abnormal   Collection Time: 06/05/16  8:17 PM  Result Value Ref Range   Neutrophils Relative % 77 %   Neutro Abs 7.9 (H) 1.7 - 7.7 K/uL   Lymphocytes Relative 14 %   Lymphs Abs 1.5 0.7 - 4.0 K/uL   Monocytes Relative 7 %   Monocytes Absolute 0.8 0.1 - 1.0 K/uL   Eosinophils Relative 2 %   Eosinophils Absolute 0.2 0.0 - 0.7 K/uL   Basophils Relative 0 %   Basophils Absolute 0.0 0.0 - 0.1 K/uL  Comprehensive metabolic panel     Status: Abnormal   Collection Time: 06/05/16  8:17 PM  Result Value Ref Range   Sodium 125 (L) 135 - 145 mmol/L   Potassium 4.3 3.5 - 5.1 mmol/L   Chloride 97 (L) 101 -  111 mmol/L   CO2 16 (L) 22 - 32 mmol/L   Glucose, Bld 91 65 - 99 mg/dL   BUN 9 6 - 20 mg/dL   Creatinine, Ser 1.44 (H) 0.44 - 1.00 mg/dL   Calcium 9.0 8.9 - 10.3 mg/dL   Total Protein 6.2 (L) 6.5 - 8.1 g/dL   Albumin 3.2 (L) 3.5 - 5.0 g/dL   AST 29 15 - 41 U/L   ALT 13 (L) 14 - 54 U/L   Alkaline Phosphatase 111 38 - 126 U/L   Total Bilirubin 0.4 0.3 - 1.2 mg/dL   GFR calc non Af Amer 41 (L) >60 mL/min   GFR calc Af Amer 47 (L) >60 mL/min    Comment: (NOTE) The eGFR has been calculated using the  CKD EPI equation. This calculation has not been validated in all clinical situations. eGFR's persistently <60 mL/min signify possible Chronic Kidney Disease.    Anion gap 12 5 - 15  I-stat troponin, ED (not at [facility], [facility])     Status: None   Collection Time: 06/05/16  8:20 PM  Result Value Ref Range   Troponin i, poc 0.00 0.00 - 0.08 ng/mL   Comment 3            Comment: Due to the release kinetics of cTnI, a negative result within the first hours of the onset of symptoms does not rule out myocardial infarction with certainty. If myocardial infarction is still suspected, repeat the test at appropriate intervals.   I-Stat Chem 8, ED  (not at [facility], [facility])     Status: Abnormal   Collection Time: 06/05/16  8:22 PM  Result Value Ref Range   Sodium 127 (L) 135 - 145 mmol/L   Potassium 4.4 3.5 - 5.1 mmol/L   Chloride 100 (L) 101 - 111 mmol/L   BUN 10 6 - 20 mg/dL   Creatinine, Ser 1.40 (H) 0.44 - 1.00 mg/dL   Glucose, Bld 87 65 - 99 mg/dL   Calcium, Ion 1.06 (L) 1.15 - 1.40 mmol/L   TCO2 19 0 - 100 mmol/L   Hemoglobin 14.6 12.0 - 15.0 g/dL   HCT 43.0 36.0 - 46.0 %   Ct Head Code Stroke W/o Cm  Result Date: 06/05/2016 CLINICAL DATA:  Code stroke. Left-sided weakness with facial droop and slurred speech. EXAM: CT HEAD WITHOUT CONTRAST TECHNIQUE: Contiguous axial images were obtained from the base of the skull through the vertex without intravenous contrast. COMPARISON:  09/12/2015 FINDINGS:  Negative for acute hemorrhage. No visible infarct or hyperdense vessel. Generalized cerebral and cerebellar atrophy that is stable from 2016. No acute osseous, orbital, or sinus finding. ASPECTS ([place] Stroke Program Early CT Score) - Ganglionic level infarction (caudate, lentiform nuclei, internal capsule, insula, M1-M3 cortex): 7 - Supraganglionic infarction (M4-M6 cortex): 3 Total score (0-10 with 10 being normal): 10 These results were called by telephone at the time of interpretation on 06/05/2016 at 8:26 pm to Dr. Nicole Kindred, who verbally acknowledged these results. IMPRESSION: 1. Negative for hemorrhage or visible infarct. 2. ASPECTS is 10. Electronically Signed   By: Monte Fantasia M.D.   On: 06/05/2016 20:28    Assessment: 55 y.o. female with multiple risk factors for stroke presenting with probable left subcortical MCA territory TIA or stroke.  Stroke Risk Factors: Hypertension, prostatic mitral valve, family history  Plan: 1. HgbA1c, fasting lipid panel 2. MRI, MRA  of the brain without contrast 3. PT consult, OT consult, Speech consult 4. Echocardiogram 5. Carotid dopplers 6. Prophylactic therapy-Anticoagulation: Coumadin 7. Risk factor modification 8. Telemetry monitoring  C.R. Nicole Kindred, MD [facility] [phone]  06/05/2016, 8:50 PM

## 2016-06-05 NOTE — ED Triage Notes (Signed)
Pt comes in [facility] after having central chest pain. Pt received 2 nitro pta. PT then reported left sided weakness that started at 1700. Pt reports numbness and tingling in left arm and has weakness in left leg. NAD, A&O x 4

## 2016-06-05 NOTE — ED Notes (Signed)
Pt's CBG result: 94, informed [place]-RN.

## 2016-06-05 NOTE — H&P (Signed)
History and Physical    Mary [name] [id number] DOB: 1961/03/10 DOA: 06/05/2016  PCP: Loralie Champagne, MD   Patient coming from: Home  Chief Complaint: Left facial droop, left sided weakness, chest pain, shortness of breath  HPI: Mary [name] is a 55 y.o. 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  She also reports generally not feeling well for several hours prior to that.  She subsequently developed chest pain and shortness of breath, which prompted her to call 911.  She took her warfarin dose today but not her baby aspirin.  Two 2L NTG were administered by EMS personnel.  She was transferred to the ED for further evaluation.  ED Course: Code Stroke was called initially.  Head CT did not show acute CVA.  Atrophy was stable.  Neurology consulted.  TPA was not administered due to rapid improvement in symptoms; low NIH Stroke scale score.  Hospitalist asked to admit for complete TIA/CVA work-up.  Of note, INR is 2, which is subtherapeutic for history of mechanical mitral valve.  EKG shows NSR without acute ST segment changes.  U/A does not appear infected, but she has evidence of AKI with hyponatremia and acidosis.  First troponin negative.  UDS positive for THC.  Review of Systems: As per HPI otherwise 10 point review of systems negative.    Past Medical History:  Diagnosis Date  . Acute diastolic heart failure ([place]) 03/15/2013  . Anemia   . Asthma    "as a baby"   . Carotid stenosis    Carotid US 2/17: bilateral ICA 1-39% >> FU prn  . Complete heart block ([place]) 05/28/2013   Post-op  . Epigastric hernia 200's  . GERD (gastroesophageal reflux disease)   . History of blood transfusion    "14 w/1st pregnancy; 2 w/last C-section" (03/15/2013)  . Hx of echocardiogram     a. Echo 4/16:  Mild focal basal septal hypertrophy, EF 60-65%, no RWMA, Mechanical MVR ok with mild central regurgitation and no perivalvular leak, small mobile density attached to valvular ring (1.5x1 cm) - post surgical changes vs SBE, mild LAE;   b. Echo 2/17:  Mild post wall LVH, EF 55-60%, no RWMA, mechanical MVR ok (mean 5 mmHg), normal RVSF  . Hypertension    no pcp   will go to [facility]  . 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 S/P mitral valve replacement with metallic valve [id number]   64mm Sorin Carbomedics mechanical prosthesis via right mini thoracotomy approach  . 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   Complex valvuloplasty including Cor-matrix ECM patch augmentation of anterior and lateral leaflets with 57mm Edwards mc3 ring annuloplasty via right mini-thoracotomy approach  . Severe mitral regurgitation 04/22/2013  . SVT (supraventricular tachycardia) ([place]) 03/16/2013  . Tricuspid regurgitation     Past Surgical History:  Procedure Laterality Date  . APPENDECTOMY  [id number]  . CARDIAC CATHETERIZATION    . [place]; 1999  . CESAREAN SECTION WITH BILATERAL TUBAL LIGATION  1999  . FEMORAL HERNIA REPAIR Right 05/23/2013   Procedure: HERNIA REPAIR FEMORAL;  Surgeon: Rexene Alberts, MD;  Location: [place];  Service: Open Heart Surgery;  Laterality: Right;  . INTRAOPERATIVE TRANSESOPHAGEAL ECHOCARDIOGRAM N/A 05/23/2013   Procedure: INTRAOPERATIVE TRANSESOPHAGEAL ECHOCARDIOGRAM;  Surgeon: Rexene Alberts, MD;  Location: [facility]  OR;  Service: Open Heart Surgery;  Laterality: N/A;  . LAPAROSCOPIC CHOLECYSTECTOMY  2003  . LEFT AND RIGHT HEART CATHETERIZATION WITH CORONARY ANGIOGRAM N/A 03/22/2013   Procedure: LEFT AND RIGHT HEART CATHETERIZATION WITH CORONARY ANGIOGRAM;  Surgeon: Burnell Blanks, MD;  Location: [facility] CATH LAB;  Service: Cardiovascular;  Laterality: N/A;  . MINIMALLY INVASIVE TRICUSPID VALVE REPAIR Right 05/23/2013   Procedure: MINIMALLY INVASIVE  TRICUSPID VALVE REPAIR;  Surgeon: Rexene Alberts, MD;  Location: [place];  Service: Open Heart Surgery;  Laterality: Right;  . MITRAL VALVE REPLACEMENT N/A 05/23/2013   Procedure: MITRAL VALVE (MV) REPLACEMENT;  Surgeon: Rexene Alberts, MD;  Location: [place];  Service: Open Heart Surgery;  Laterality: N/A;  . MULTIPLE EXTRACTIONS WITH ALVEOLOPLASTY N/A 04/04/2013   Procedure: Extraction of tooth #'s 1,8,9,13,14,15,23,24,25,26 with alveoloplasty and gross debridement of remaining teeth;  Surgeon: Lenn Cal, DDS;  Location: [place];  Service: Oral Surgery;  Laterality: N/A;  . TEE WITHOUT CARDIOVERSION N/A 03/18/2013   Procedure: TRANSESOPHAGEAL ECHOCARDIOGRAM (TEE);  Surgeon: Larey Dresser, MD;  Location: [place];  Service: Cardiovascular;  Laterality: N/A;  . TEE WITHOUT CARDIOVERSION N/A 06/17/2013   Procedure: TRANSESOPHAGEAL ECHOCARDIOGRAM (TEE);  Surgeon: Lelon Perla, MD;  Location: [facility] ENDOSCOPY;  Service: Cardiovascular;  Laterality: N/A;     reports that she quit smoking about 3 years ago. Her smoking use included Cigarettes. She has a 18.00 pack-year smoking history. She has never used smokeless tobacco. She reports that she drinks about 3.6 oz of alcohol per week . She reports that she does not use drugs.  UDS positive for THC; she denied illicit drug use. Social EtOH. She is not married.  She has three children.   Her mother, Daijha Mcconaughey, is her next of kin/emergency contact.  Allergies  Allergen Reactions  . Oxycodone Itching    Family History  Problem Relation Age of Onset  . Cancer Mother   . Stroke Father   . Sarcoidosis Sister   . Heart attack Neg Hx      Prior to Admission medications   Medication Sig Start Date End Date Taking? Authorizing Provider  acetaminophen (TYLENOL) 500 MG tablet Take 500 mg by mouth every 6 (six) hours as needed for mild pain or moderate pain.   Yes Historical Provider, MD  aspirin EC 81 MG tablet Take 1 tablet (81 mg total) by  mouth daily. 08/26/13  Yes Larey Dresser, MD  warfarin (COUMADIN) 5 MG tablet Take 5-7.5 mg by mouth daily. Take 1 and 1/2 tablet on Mon wed and Fridays rest of the days take a whole tablet   Yes Historical Provider, MD  [facility] HAZEL EX Apply 1 application topically at bedtime as needed (knee pain).    Yes Historical Provider, MD    Physical Exam: Vitals:   06/05/16 2130 06/05/16 2145 06/05/16 2200 06/05/16 2215  BP: 121/95 136/92 (!) 133/118 122/88  Pulse: 95 98 (!) 57 100  Resp: 16 15 (!) 27 16  Temp:      TempSrc:      SpO2: 100% 99% 100% 99%  Weight:          Constitutional: NAD, calm, comfortable Vitals:   06/05/16 2130 06/05/16 2145 06/05/16 2200 06/05/16 2215  BP: 121/95 136/92 (!) 133/118 122/88  Pulse: 95 98 (!) 57 100  Resp: 16 15 (!) 27 16  Temp:      TempSrc:      SpO2: 100% 99% 100% 99%  Weight:  Eyes: PERRL, lids and conjunctivae normal ENMT: Mucous membranes are moist. Posterior pharynx clear of any exudate or lesions. Normal dentition.  Neck: normal appearance, supple, no masses Respiratory: clear to auscultation bilaterally, no wheezing, no crackles. Normal respiratory effort. No accessory muscle use.  Cardiovascular: Normal rate, regular rhythm, + mechanical click.  No gallops. No extremity edema. 2+ pedal pulses. No carotid bruits.  GI: abdomen is soft and compressible.  No distention.  No tenderness.  Bowel sounds are present. Musculoskeletal:  No joint deformity in upper and lower extremities. Good ROM, no contractures. Normal muscle tone.  Skin: no rashes, warm and dry Neurologic: She reported decreased sensation in the left side of her face.  No facial droop apparent.  Tongue is midline.  Otherwise, CN 2-12 grossly intact.  She has detectable weakness in her left arm and leg compared to her right.  Coordination intact.  No pronator drift.    Psychiatric: Normal judgment and insight. Alert and oriented x 3. Normal mood.     Labs on Admission: I  have personally reviewed following labs and imaging studies  CBC:  Recent Labs Lab 06/05/16 2017 06/05/16 2022  WBC 10.4  --   NEUTROABS 7.9*  --   HGB 13.9 14.6  HCT 38.8 43.0  MCV 97.5  --   PLT 375  --    Basic Metabolic Panel:  Recent Labs Lab 06/05/16 2017 06/05/16 2022  NA 125* 127*  K 4.3 4.4  CL 97* 100*  CO2 16*  --   GLUCOSE 91 87  BUN 9 10  CREATININE 1.44* 1.40*  CALCIUM 9.0  --    GFR: Estimated Creatinine Clearance: 40.7 mL/min (by C-G formula based on SCr of 1.4 mg/dL). Liver Function Tests:  Recent Labs Lab 06/05/16 2017  AST 29  ALT 13*  ALKPHOS 111  BILITOT 0.4  PROT 6.2*  ALBUMIN 3.2*   Coagulation Profile:  Recent Labs Lab 06/05/16 2017  INR 2.00   CBG:  Recent Labs Lab 06/05/16 2013  GLUCAP 94   Urine analysis:    Component Value Date/Time   COLORURINE YELLOW 06/05/2016 2108   APPEARANCEUR CLOUDY (A) 06/05/2016 2108   LABSPEC 1.012 06/05/2016 2108   PHURINE 5.5 06/05/2016 2108   GLUCOSEU NEGATIVE 06/05/2016 2108   HGBUR LARGE (A) 06/05/2016 2108   BILIRUBINUR NEGATIVE 06/05/2016 2108   [place] NEGATIVE 06/05/2016 2108   PROTEINUR 30 (A) 06/05/2016 2108   UROBILINOGEN 0.2 06/04/2015 1533   NITRITE NEGATIVE 06/05/2016 2108   LEUKOCYTESUR TRACE (A) 06/05/2016 2108   UDS positive for THC  Radiological Exams on Admission: Ct Head Code Stroke W/o Cm  Result Date: 06/05/2016 CLINICAL DATA:  Code stroke. Left-sided weakness with facial droop and slurred speech. EXAM: CT HEAD WITHOUT CONTRAST TECHNIQUE: Contiguous axial images were obtained from the base of the skull through the vertex without intravenous contrast. COMPARISON:  09/12/2015 FINDINGS: Negative for acute hemorrhage. No visible infarct or hyperdense vessel. Generalized cerebral and cerebellar atrophy that is stable from 2016. No acute osseous, orbital, or sinus finding. ASPECTS ([place] Stroke Program Early CT Score) - Ganglionic level infarction (caudate,  lentiform nuclei, internal capsule, insula, M1-M3 cortex): 7 - Supraganglionic infarction (M4-M6 cortex): 3 Total score (0-10 with 10 being normal): 10 These results were called by telephone at the time of interpretation on 06/05/2016 at 8:26 pm to Dr. Nicole Kindred, who verbally acknowledged these results. IMPRESSION: 1. Negative for hemorrhage or visible infarct. 2. ASPECTS is 10. Electronically Signed   By: Angelica Chessman  Watts M.D.   On: 06/05/2016 20:28    EKG: Independently reviewed. NSR.  No acute ST segment elevation.  Assessment/Plan Principal Problem:   Stroke [facility]) Active Problems:   S/P minimally-invasive mitral valve replacement with mechanical valve and tricuspid valve repair   Long term (current) use of anticoagulants   Weakness   AKI (acute kidney injury) ([place])   Subtherapeutic international normalized ratio (INR)   Chest pain      Stroke-like symptoms with history of TIA --Admit to telemetry --MRI brain/MRA head per neurology recommendations --Will continue baby aspirin since she was already on it at home (in addition to warfarin) --Fasting lipid panel and A1c --PT/OT/Speech --Echo and carotid ultrasounds  Chest pain --Telemetry monitoring --Baby aspirin --Serial troponin --Echo and lipids as above  AKI --Hydrate with NS, repeat BMP in the AM  Mechanical mitral valve; subtherapeutic INR --Heparin infusion until INR greater than 2.5 --Pharmacy to manage   DVT prophylaxis: Anticoagulated with warfarin Code Status: FULL Family Communication: Patient alone at time of admission in the ED Disposition Plan: To be determined Consults called: Neurology Admission status: Observation, telemetry   TIME SPENT: 70 minutes   Eber Jones MD [facility] Pager [phone]  If 7PM-7AM, please contact night-coverage [email or web address] Password [email or web address]  06/05/2016, 10:57 PM

## 2016-06-05 NOTE — ED Notes (Signed)
Pt A&O x 4.Ambulatory- next neuro check due at 0000. Pt reports continued left sided numbness and weakness in left leg

## 2016-06-05 NOTE — ED Provider Notes (Signed)
[name] DEPT Provider Note   CSN: [id number] Arrival date & time: 06/05/16  2011     History   Chief Complaint No chief complaint on file.   HPI Mary [name] is a 55 y.o. female.  HPI  55 year old female history of valve replacement on anticoagulation presents today stating that she was fine until about 5:15 or 5:30 when she had an acute onset of left sided facial numbness, left arm weakness.  Past Medical History:  Diagnosis Date  . Acute diastolic heart failure ([place]) 03/15/2013  . Anemia   . Asthma    "as a baby"   . Carotid stenosis    Carotid US 2/17: bilateral ICA 1-39% >> FU prn  . Complete heart block ([place]) 05/28/2013   Post-op  . Epigastric hernia 200's  . GERD (gastroesophageal reflux disease)   . History of blood transfusion    "14 w/1st pregnancy; 2 w/last C-section" (03/15/2013)  . Hx of echocardiogram    a. Echo 4/16:  Mild focal basal septal hypertrophy, EF 60-65%, no RWMA, Mechanical MVR ok with mild central regurgitation and no perivalvular leak, small mobile density attached to valvular ring (1.5x1 cm) - post surgical changes vs SBE, mild LAE;   b. Echo 2/17:  Mild post wall LVH, EF 55-60%, no RWMA, mechanical MVR ok (mean 5 mmHg), normal RVSF  . Hypertension    no pcp   will go to [facility]  . 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 S/P mitral valve replacement with metallic valve 123XX123   60mm Sorin Carbomedics mechanical prosthesis via right mini thoracotomy approach  . 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   Complex valvuloplasty including Cor-matrix ECM patch augmentation of anterior and lateral leaflets with 49mm Edwards mc3 ring annuloplasty via right mini-thoracotomy approach  . Severe mitral regurgitation 04/22/2013  . SVT (supraventricular tachycardia) ([place]) 03/16/2013  . Tricuspid regurgitation     Patient Active Problem List   Diagnosis Date Noted  . Palpitations 03/20/2016  . Sinus tachycardia ([place]) 02/01/2016  . AKI  (acute kidney injury) ([place]) 01/10/2016  . Nausea and vomiting 01/10/2016  . Flu-like symptoms 01/10/2016  . Subtherapeutic international normalized ratio (INR) 01/10/2016  . Hematuria 01/10/2016  . TIA (transient ischemic attack) 09/13/2015  . History of rheumatic heart disease 09/13/2015  . CVA (cerebral vascular accident) ([place]) 09/12/2015  . Exertional dyspnea 02/24/2014  . First degree heart block 06/17/2013  . Weakness 06/16/2013  . (HFpEF) heart failure with preserved ejection fraction ([place]) 06/16/2013  . Anemia 06/16/2013  . Thrombocytosis ([place]) 06/16/2013  . History of bacterial endocarditis 06/16/2013  . Heart valve replaced by other means 06/10/2013  . Long term (current) use of anticoagulants 06/10/2013  . Complete heart block ([place]) 05/28/2013  . S/P minimally-invasive mitral valve replacement with mechanical valve and tricuspid valve repair 05/23/2013  . S/P minimally-invasive tricuspid valve repair 05/23/2013  . Femoral hernia 05/23/2013  . Severe mitral regurgitation 04/22/2013  . Mitral valve disorders 04/22/2013  . Tricuspid regurgitation 04/22/2013  . 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 SVT (supraventricular tachycardia) ([place]) 03/16/2013    Past Surgical History:  Procedure Laterality Date  . APPENDECTOMY  [id number]  . CARDIAC CATHETERIZATION    . [place]; 1999  . CESAREAN SECTION WITH BILATERAL TUBAL LIGATION  1999  . FEMORAL HERNIA REPAIR Right 05/23/2013   Procedure: HERNIA REPAIR FEMORAL;  Surgeon: Rexene Alberts, MD;  Location: [place];  Service: Open Heart Surgery;  Laterality: Right;  .  INTRAOPERATIVE TRANSESOPHAGEAL ECHOCARDIOGRAM N/A 05/23/2013   Procedure: INTRAOPERATIVE TRANSESOPHAGEAL ECHOCARDIOGRAM;  Surgeon: Rexene Alberts, MD;  Location: [place];  Service: Open Heart Surgery;  Laterality: N/A;  . LAPAROSCOPIC CHOLECYSTECTOMY  2003  . LEFT AND RIGHT HEART CATHETERIZATION WITH CORONARY ANGIOGRAM N/A 03/22/2013    Procedure: LEFT AND RIGHT HEART CATHETERIZATION WITH CORONARY ANGIOGRAM;  Surgeon: Burnell Blanks, MD;  Location: [facility] CATH LAB;  Service: Cardiovascular;  Laterality: N/A;  . MINIMALLY INVASIVE TRICUSPID VALVE REPAIR Right 05/23/2013   Procedure: MINIMALLY INVASIVE TRICUSPID VALVE REPAIR;  Surgeon: Rexene Alberts, MD;  Location: [place];  Service: Open Heart Surgery;  Laterality: Right;  . MITRAL VALVE REPLACEMENT N/A 05/23/2013   Procedure: MITRAL VALVE (MV) REPLACEMENT;  Surgeon: Rexene Alberts, MD;  Location: [place];  Service: Open Heart Surgery;  Laterality: N/A;  . MULTIPLE EXTRACTIONS WITH ALVEOLOPLASTY N/A 04/04/2013   Procedure: Extraction of tooth #'s 1,8,9,13,14,15,23,24,25,26 with alveoloplasty and gross debridement of remaining teeth;  Surgeon: Lenn Cal, DDS;  Location: [place];  Service: Oral Surgery;  Laterality: N/A;  . TEE WITHOUT CARDIOVERSION N/A 03/18/2013   Procedure: TRANSESOPHAGEAL ECHOCARDIOGRAM (TEE);  Surgeon: Larey Dresser, MD;  Location: [place];  Service: Cardiovascular;  Laterality: N/A;  . TEE WITHOUT CARDIOVERSION N/A 06/17/2013   Procedure: TRANSESOPHAGEAL ECHOCARDIOGRAM (TEE);  Surgeon: Lelon Perla, MD;  Location: [facility] ENDOSCOPY;  Service: Cardiovascular;  Laterality: N/A;    OB History    No data available       Home Medications    Prior to Admission medications   Medication Sig Start Date End Date Taking? Authorizing Provider  acetaminophen (TYLENOL) 500 MG tablet Take 500 mg by mouth every 6 (six) hours as needed for mild pain or moderate pain.    Historical Provider, MD  aspirin EC 81 MG tablet Take 1 tablet (81 mg total) by mouth daily. 08/26/13   Larey Dresser, MD  metoprolol succinate (TOPROL-XL) 25 MG 24 hr tablet Take 1 tablet (25 mg total) by mouth daily. Take with or immediately following a meal. 03/18/16   Larey Dresser, MD  warfarin (COUMADIN) 5 MG tablet Take as directed by coumadin clinic 06/03/16   Evans Lance, MD  [facility]  HAZEL EX Apply 1 application topically at bedtime as needed (knee pain).     Historical Provider, MD    Family History Family History  Problem Relation Age of Onset  . Sarcoidosis Sister   . Stroke Father   . Heart attack Neg Hx   . Cancer Mother     Social History Social History  Substance Use Topics  . Smoking status: Former Smoker    Packs/day: 0.50    Years: 36.00    Types: Cigarettes    Quit date: 03/15/2013  . Smokeless tobacco: Never Used  . Alcohol use 3.6 oz/week    6 Glasses of wine per week     Comment: 03/15/2013 "bottle of wine/wk"     Allergies   Oxycodone   Review of Systems Review of Systems  All other systems reviewed and are negative.    Physical Exam Updated Vital Signs LMP 01/28/2013   Physical Exam  Constitutional: She is oriented to person, place, and time. She appears well-developed and well-nourished. No distress.  HENT:  Head: Normocephalic and atraumatic.  Right Ear: External ear normal.  Left Ear: External ear normal.  Mild facial asymmetry  Eyes: EOM are normal. Pupils are equal, round, and reactive to light.  Neck: Normal  range of motion. Neck supple.  Cardiovascular: Normal rate and regular rhythm.   Pulmonary/Chest: Effort normal and breath sounds normal.  Abdominal: Soft. Bowel sounds are normal.  Musculoskeletal: Normal range of motion.  Neurological: She is alert and oriented to person, place, and time. She has normal reflexes.  Mild facial asymmetry No palmar drift Left leg able to extend against gravity but only for several inches and not able to hold up the on count of 1 Right leg with normal strength  Skin: Skin is warm and dry. Capillary refill takes less than 2 seconds.  Psychiatric: She has a normal mood and affect. Her behavior is normal.  Nursing note and vitals reviewed.    ED Treatments / Results  Labs (all labs ordered are listed, but only abnormal results are displayed) Labs Reviewed  ETHANOL    PROTIME-INR  APTT  CBC  DIFFERENTIAL  COMPREHENSIVE METABOLIC PANEL  URINE RAPID DRUG SCREEN, HOSP PERFORMED  URINALYSIS, ROUTINE W REFLEX MICROSCOPIC (NOT AT [facility])  I-STAT CHEM 8, ED  I-STAT TROPOININ, ED    EKG  EKG Interpretation  Date/Time:  Sunday June 05 2016 20:32:00 EDT Ventricular Rate:  98 PR Interval:    QRS Duration: 95 QT Interval:  376 QTC Calculation: 481 R Axis:   -35 Text Interpretation:  Sinus rhythm Probable left atrial enlargement Left axis deviation Low voltage, precordial leads RSR' in V1 or V2, right VCD or RVH Nonspecific T abnrm, anterolateral leads Confirmed by Jahlisa Rossitto MD, Andee Poles [id number]) on 06/05/2016 9:37:37 PM       Radiology No results found.  Procedures Procedures (including critical care time)  Medications Ordered in ED Medications - No data to display   Initial Impression / Assessment and Plan / ED Course  I have reviewed the triage vital signs and the nursing notes.  Pertinent labs & imaging results that were available during my care of the patient were reviewed by me and considered in my medical decision making (see chart for details).  Clinical Course    Stroke scale 2, patient seen by neurology and no bleed,patient not lytic candidate secondary to low stroke scale score.  1-Stroke-no hemorrhage seen on CT. Stroke Scale at 2 and patient is not candidate for TPA 2-hyponatremia 3 status post valve replacement patient anticoagulated INR 2.0 will need Coumadin adjustment 4- uds postive for cocaine  Discussed with Dr. Eulas Post on call for hospitalist and plan observation telemetry bed    Final diagnoses:  Cerebrovascular accident (CVA), unspecified mechanism [facility])    New Prescriptions New Prescriptions   No medications on file     Pattricia Boss, MD 06/05/16 2223

## 2016-06-05 NOTE — ED Notes (Signed)
Pt ambulatory to bathroom

## 2016-06-05 NOTE — ED Notes (Signed)
Spoke with Dr. Eulas Post on the phone for orders for zofran. Pt having episode of vomiting at this time.

## 2016-06-06 ENCOUNTER — Observation Stay (HOSPITAL_BASED_OUTPATIENT_CLINIC_OR_DEPARTMENT_OTHER): Payer: Medicaid Other

## 2016-06-06 ENCOUNTER — Observation Stay (HOSPITAL_COMMUNITY): Payer: Medicaid Other

## 2016-06-06 ENCOUNTER — Encounter (HOSPITAL_COMMUNITY): Payer: Self-pay | Admitting: *Deleted

## 2016-06-06 DIAGNOSIS — F1290 Cannabis use, unspecified, uncomplicated: Secondary | ICD-10-CM | POA: Diagnosis present

## 2016-06-06 DIAGNOSIS — R402252 Coma scale, best verbal response, oriented, at arrival to emergency department: Secondary | ICD-10-CM | POA: Diagnosis present

## 2016-06-06 DIAGNOSIS — Z823 Family history of stroke: Secondary | ICD-10-CM | POA: Diagnosis not present

## 2016-06-06 DIAGNOSIS — G451 Carotid artery syndrome (hemispheric): Secondary | ICD-10-CM

## 2016-06-06 DIAGNOSIS — R079 Chest pain, unspecified: Secondary | ICD-10-CM | POA: Diagnosis present

## 2016-06-06 DIAGNOSIS — I5032 Chronic diastolic (congestive) heart failure: Secondary | ICD-10-CM | POA: Diagnosis present

## 2016-06-06 DIAGNOSIS — Z7982 Long term (current) use of aspirin: Secondary | ICD-10-CM | POA: Diagnosis not present

## 2016-06-06 DIAGNOSIS — Z885 Allergy status to narcotic agent status: Secondary | ICD-10-CM | POA: Diagnosis not present

## 2016-06-06 DIAGNOSIS — R402362 Coma scale, best motor response, obeys commands, at arrival to emergency department: Secondary | ICD-10-CM | POA: Diagnosis present

## 2016-06-06 DIAGNOSIS — Z8673 Personal history of transient ischemic attack (TIA), and cerebral infarction without residual deficits: Secondary | ICD-10-CM | POA: Diagnosis not present

## 2016-06-06 DIAGNOSIS — Z7901 Long term (current) use of anticoagulants: Secondary | ICD-10-CM | POA: Diagnosis not present

## 2016-06-06 DIAGNOSIS — G459 Transient cerebral ischemic attack, unspecified: Secondary | ICD-10-CM

## 2016-06-06 DIAGNOSIS — K219 Gastro-esophageal reflux disease without esophagitis: Secondary | ICD-10-CM | POA: Diagnosis present

## 2016-06-06 DIAGNOSIS — Z87891 Personal history of nicotine dependence: Secondary | ICD-10-CM | POA: Diagnosis not present

## 2016-06-06 DIAGNOSIS — R29702 NIHSS score 2: Secondary | ICD-10-CM | POA: Diagnosis present

## 2016-06-06 DIAGNOSIS — R531 Weakness: Secondary | ICD-10-CM | POA: Diagnosis not present

## 2016-06-06 DIAGNOSIS — E871 Hypo-osmolality and hyponatremia: Secondary | ICD-10-CM | POA: Diagnosis present

## 2016-06-06 DIAGNOSIS — R402142 Coma scale, eyes open, spontaneous, at arrival to emergency department: Secondary | ICD-10-CM | POA: Diagnosis present

## 2016-06-06 DIAGNOSIS — I110 Hypertensive heart disease with heart failure: Secondary | ICD-10-CM | POA: Diagnosis present

## 2016-06-06 DIAGNOSIS — Z952 Presence of prosthetic heart valve: Secondary | ICD-10-CM | POA: Diagnosis not present

## 2016-06-06 DIAGNOSIS — R791 Abnormal coagulation profile: Secondary | ICD-10-CM | POA: Diagnosis present

## 2016-06-06 DIAGNOSIS — G458 Other transient cerebral ischemic attacks and related syndromes: Secondary | ICD-10-CM | POA: Diagnosis not present

## 2016-06-06 DIAGNOSIS — E872 Acidosis: Secondary | ICD-10-CM | POA: Diagnosis present

## 2016-06-06 DIAGNOSIS — N179 Acute kidney failure, unspecified: Secondary | ICD-10-CM | POA: Diagnosis present

## 2016-06-06 DIAGNOSIS — E785 Hyperlipidemia, unspecified: Secondary | ICD-10-CM | POA: Diagnosis present

## 2016-06-06 LAB — LIPID PANEL
CHOLESTEROL: 212 mg/dL — AB (ref 0–200)
HDL: 66 mg/dL (ref >40)
LDL Cholesterol: 124 mg/dL — ABNORMAL HIGH (ref 0–99)
TRIGLYCERIDES: 108 mg/dL (ref <150)
Total CHOL/HDL Ratio: 3.2 RATIO
VLDL: 22 mg/dL (ref 0–40)

## 2016-06-06 LAB — CBC
HCT: 39.7 % (ref 36.0–46.0)
Hemoglobin: 13.7 g/dL (ref 12.0–15.0)
MCH: 34.5 pg — AB (ref 26.0–34.0)
MCHC: 34.5 g/dL (ref 30.0–36.0)
MCV: 100 fL (ref 78.0–100.0)
Platelets: 407 10*3/uL — ABNORMAL HIGH (ref 150–400)
RBC: 3.97 MIL/uL (ref 3.87–5.11)
RDW: 13.6 % (ref 11.5–15.5)
WBC: 8.4 10*3/uL (ref 4.0–10.5)

## 2016-06-06 LAB — BASIC METABOLIC PANEL
Anion gap: 12 (ref 5–15)
BUN: 11 mg/dL (ref 6–20)
CALCIUM: 9.1 mg/dL (ref 8.9–10.3)
CHLORIDE: 103 mmol/L (ref 101–111)
CO2: 18 mmol/L — AB (ref 22–32)
CREATININE: 1.09 mg/dL — AB (ref 0.44–1.00)
GFR calc non Af Amer: 57 mL/min — ABNORMAL LOW (ref >60)
Glucose, Bld: 97 mg/dL (ref 65–99)
Potassium: 4 mmol/L (ref 3.5–5.1)
SODIUM: 133 mmol/L — AB (ref 135–145)

## 2016-06-06 LAB — HEPARIN LEVEL (UNFRACTIONATED): Heparin Unfractionated: 0.33 IU/mL (ref 0.30–0.70)

## 2016-06-06 LAB — ECHOCARDIOGRAM COMPLETE
Height: 62 in
Weight: 2306.89 oz

## 2016-06-06 LAB — TROPONIN I: Troponin I: <0.03 ng/mL (ref <0.03)

## 2016-06-06 LAB — PROTIME-INR
INR: 1.97
Prothrombin Time: 22.7 seconds — ABNORMAL HIGH (ref 11.4–15.2)

## 2016-06-06 MED ORDER — ATORVASTATIN CALCIUM 10 MG PO TABS
20.0000 mg | ORAL_TABLET | Freq: Every day | ORAL | Status: DC
  Administered 2016-06-06 – 2016-06-08 (×3): 20 mg via ORAL
  Filled 2016-06-06 (×3): qty 2

## 2016-06-06 MED ORDER — SODIUM CHLORIDE 0.9 % IV SOLN
INTRAVENOUS | Status: DC
  Administered 2016-06-06 – 2016-06-07 (×4): via INTRAVENOUS

## 2016-06-06 MED ORDER — ENOXAPARIN SODIUM 80 MG/0.8ML ~~LOC~~ SOLN
1.0000 mg/kg | Freq: Two times a day (BID) | SUBCUTANEOUS | Status: DC
Start: 2016-06-06 — End: 2016-06-06

## 2016-06-06 MED ORDER — ATORVASTATIN CALCIUM 40 MG PO TABS: 40.0000 mg | ORAL_TABLET | Freq: Every day | ORAL | Status: DC

## 2016-06-06 MED ORDER — ACETAMINOPHEN 500 MG PO TABS
500.0000 mg | ORAL_TABLET | Freq: Four times a day (QID) | ORAL | Status: DC | PRN
  Administered 2016-06-07: 500 mg via ORAL
  Filled 2016-06-06: qty 1

## 2016-06-06 MED ORDER — HEPARIN (PORCINE) IN NACL 100-0.45 UNIT/ML-% IJ SOLN
900.0000 [IU]/h | INTRAMUSCULAR | Status: DC
  Administered 2016-06-06: 900 [IU]/h via INTRAVENOUS
  Administered 2016-06-07: 950 [IU]/h via INTRAVENOUS
  Administered 2016-06-08: 900 [IU]/h via INTRAVENOUS
  Filled 2016-06-06 (×5): qty 250

## 2016-06-06 MED ORDER — WARFARIN SODIUM 5 MG PO TABS
10.0000 mg | ORAL_TABLET | Freq: Once | ORAL | Status: AC
  Administered 2016-06-06: 10 mg via ORAL
  Filled 2016-06-06: qty 2

## 2016-06-06 MED ORDER — WARFARIN - PHARMACIST DOSING INPATIENT
Freq: Every day | Status: DC
  Administered 2016-06-06: 17:00:00

## 2016-06-06 MED ORDER — ASPIRIN EC 81 MG PO TBEC
81.0000 mg | DELAYED_RELEASE_TABLET | Freq: Every day | ORAL | Status: DC
  Administered 2016-06-06 – 2016-06-09 (×4): 81 mg via ORAL
  Filled 2016-06-06 (×5): qty 1

## 2016-06-06 MED ORDER — WARFARIN SODIUM 5 MG PO TABS: 5.0000 mg | ORAL_TABLET | Freq: Every day | ORAL | Status: DC

## 2016-06-06 NOTE — Progress Notes (Signed)
  Echocardiogram 2D Echocardiogram has been performed.  Jennette Dubin 06/06/2016, 3:11 PM

## 2016-06-06 NOTE — Progress Notes (Signed)
PROGRESS NOTE  Mary [name]  [id number] DOB: [birth year] DOA: 06/05/2016 PCP: Loralie Champagne, MD  Outpatient Specialists: Dr. Aundra Dubin, cardiology.  Brief Narrative: Mary [name] is a 55 y.o. woman with a history of mechanical MVR and TV repair Aug 2014 on coumadin, HTN, GERD, and history of TIA in the setting of subtherapeutic INR who presented to the ED after developing left sided facial droop and weakness in left upper and lower extremities. She subsequently developed chest pain and shortness of breath. She reported adherence to warfarin dosing, though INR was subtherapeutic at 2.0. Code Stroke was called, head CT did not show acute CVA. Symptoms rapidly resolved and further evaluation with MRI was negative. Echo showed preserved EF and normally functioning MV prosthesis. Carotid U/S report pending. She remains on heparin gtt per pharmacy as INR remains subtherapeutic.  Assessment & Plan: Principal Problem:   Stroke 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 This is a recurrent issue: She had TIA Dec 2016 with subtherapeutic INR. Other RF's: former tobacco smoker, +FH in father.  - Symptoms resolved and MRI/MRA do not show acute infarct or critical stenosis, AVM or aneurysm.  - Continue ASA 81mg  and coumadin after discharge, requires bridge as below.  - LDL 124, Hb A1c pending: Started lipitor 20mg .  - PT/OT/Speech: outpatient PT recommended for now.  - Echo without evident thromboembolic source, and carotid ultrasounds pending. - Continue telemetry  Chest pain, atypical, resolved: Troponins negative,  ECG unchanged, echo without wall motion  abnormalities.  - No further work up indicated  AKI --Hydrate with NS, repeat BMP in the AM  Mechanical MVR with subtherapeutic INR: She reports adherence with dosing and INR checks at [facility] cardiology. In fact, last EMR reported INR PTA was 3.5 on March 18, 2016.  - Heparin infusion until INR greater than 2.5 - Greatly appreciate pharmacy's management. Heparin level therapeutic.   DVT prophylaxis: IV heparin, coumadin Code Status: Full Family Communication: Discussed with patient. She declined offer to contact family at this time.  Disposition Plan: Ongoing heparin infusion pending therapeutic INR.   Consultants:   Neurology, Dr. Erlinda Hong, signed off 9/11  Procedures:   Echo 06/06/2016: - Left ventricle: The cavity size was normal. Wall thickness was   normal. Systolic function was normal. The estimated ejection   fraction was in the range of 55% to 60%. Although no diagnostic   regional wall motion abnormality was identified, this possibility   cannot be completely excluded on the basis of this study. - Mitral valve: A mechanical prosthesis was present and functioning   normally. Valve area by continuity equation (using LVOT flow):   1.42 cm^2. - Right atrium: The atrium was mildly dilated. - Tricuspid valve: Prior procedures included surgical repair. A   ring annulus prosthesis was present and functioning normally.  Impressions:  - No change in mitral or tricuspid gradients compared to earlier   this year. Current study at average heart rate 53f 88 bpm.  Antimicrobials:  None   Subjective: Pt with no new deficits, still feel weak in right arm. Has been compliant with all medications. No palpitations, dyspnea, or chest pain today.   Objective: Vitals:   06/06/16 0105 06/06/16  0300 06/06/16 0500 06/06/16 0700  BP: 113/74 116/61 117/75 118/76  Pulse: 93 95 99 92  Resp: 17 18 18 16   Temp: 98 F (36.7 C) 98 F (36.7 C) 98.2 F (36.8 C) 98.4 F (36.9 C)  TempSrc:  Oral Oral Oral Oral  SpO2: 99% 95% 97% 98%  Weight: 65.4 kg (144 lb 2.9 oz)     Height: 5\' 2"  (1.575 m)      No intake or output data in the 24 hours ending 06/06/16 1602 Filed Weights   06/05/16 2030 06/06/16 0100 06/06/16 0105  Weight: 63.6 kg (140 lb 3.4 oz) 63.6 kg (140 lb 3.4 oz) 65.4 kg (144 lb 2.9 oz)    Examination: General exam: 55 y.o. female in no distress. Respiratory system: Non-labored breathing. Clear to auscultation bilaterally.  Cardiovascular system: Regular rate and rhythm. No murmur, rub, or gallop. No JVD, and no pedal edema. Gastrointestinal system: Abdomen soft, non-tender, non-distended, with normoactive bowel sounds. No organomegaly or masses felt. Central nervous system: Alert and oriented. Mild decreased strength of LLE. Paresthesias LLE and LUE but SILT. Speech normal.  Extremities: Warm, no deformities Skin: No rashes, lesions no ulcers Psychiatry: Judgement and insight appear normal. Mood & affect appropriate.   Data Reviewed: I have personally reviewed following labs and imaging studies  CBC:  Recent Labs Lab 06/05/16 2017 06/05/16 2022 06/06/16 1036  WBC 10.4  --  8.4  NEUTROABS 7.9*  --   --   HGB 13.9 14.6 13.7  HCT 38.8 43.0 39.7  MCV 97.5  --  100.0  PLT 375  --  [id number]   Basic Metabolic Panel:  Recent Labs Lab 06/05/16 2017 06/05/16 2022 06/06/16 1036  NA 125* 127* 133*  K 4.3 4.4 4.0  CL 97* 100* 103  CO2 16*  --  18*  GLUCOSE 91 87 97  BUN 9 10 11   CREATININE 1.44* 1.40* 1.09*  CALCIUM 9.0  --  9.1   GFR: Estimated Creatinine Clearance: 53 mL/min (by C-G formula based on SCr of 1.09 mg/dL). Liver Function Tests:  Recent Labs Lab 06/05/16 2017  AST 29  ALT 13*  ALKPHOS 111  BILITOT 0.4  PROT 6.2*  ALBUMIN 3.2*   No results for input(s): LIPASE, AMYLASE in the last 168 hours. No results for input(s): AMMONIA in the last 168 hours. Coagulation Profile:  Recent Labs Lab 06/05/16 2017 06/06/16 1036  INR 2.00  1.97   Cardiac Enzymes:  Recent Labs Lab 06/06/16 0136 06/06/16 1036 06/06/16 1345  TROPONINI <0.03 <0.03 <0.03   BNP (last 3 results) No results for input(s): PROBNP in the last 8760 hours. HbA1C: No results for input(s): HGBA1C in the last 72 hours. CBG:  Recent Labs Lab 06/05/16 2013  GLUCAP 94   Lipid Profile:  Recent Labs  06/06/16 0135  CHOL 212*  HDL 66  LDLCALC 124*  TRIG 108  CHOLHDL 3.2   Thyroid Function Tests: No results for input(s): TSH, T4TOTAL, FREET4, T3FREE, THYROIDAB in the last 72 hours. Anemia Panel: No results for input(s): VITAMINB12, FOLATE, FERRITIN, TIBC, IRON, RETICCTPCT in the last 72 hours. Urine analysis:    Component Value Date/Time   COLORURINE YELLOW 06/05/2016 2108   APPEARANCEUR CLOUDY (A) 06/05/2016 2108   LABSPEC 1.012 06/05/2016 2108   PHURINE 5.5 06/05/2016 2108   GLUCOSEU NEGATIVE 06/05/2016 2108   HGBUR LARGE (A) 06/05/2016 2108   BILIRUBINUR NEGATIVE 06/05/2016 2108   [place] NEGATIVE 06/05/2016 2108   PROTEINUR 30 (A) 06/05/2016 2108  UROBILINOGEN 0.2 06/04/2015 1533   NITRITE NEGATIVE 06/05/2016 2108   LEUKOCYTESUR TRACE (A) 06/05/2016 2108   Sepsis Labs: @LABRCNTIP (procalcitonin:4,lacticidven:4)  )No results found for this or any previous visit (from the past 240 hour(s)).   Radiology Studies: Mr Virgel Paling [id number] Contrast  Result Date: 06/06/2016 CLINICAL DATA:  Initial evaluation for acute onset left-sided facial numbness with left arm weakness. EXAM: MRI HEAD WITHOUT CONTRAST MRA HEAD WITHOUT CONTRAST TECHNIQUE: Multiplanar, multiecho pulse sequences of the brain and surrounding structures were obtained without intravenous contrast. Angiographic images of the head were obtained using MRA technique without contrast. COMPARISON:  Prior CT from earlier same day. FINDINGS: MRI HEAD FINDINGS Mild generalized cerebral atrophy is stable from previous studies. No significant cerebral white matter disease for patient  age. No abnormal foci of restricted diffusion to suggest acute to subacute ischemia. Gray-white matter differentiation is well maintained. Major intracranial vascular flow voids are preserved. No evidence for acute or chronic intracranial hemorrhage. Two adjacent sub cm T2 hyperintense lucencies within the left paramedian pons, which may reflect small remote lacunar infarcts versus dilated perivascular spaces. No other evidence for remote infarction. No mass lesion, midline shift, or mass effect. No hydrocephalus. No extra-axial fluid collection. Major dural sinuses are grossly patent. Craniocervical junction normal. Visualized upper cervical spine unremarkable. Pituitary gland within normal limits. Globes and orbits unremarkable. Mild scattered mucosal thickening within the ethmoidal air cells. Paranasal sinuses are otherwise clear. No mastoid effusion. Inner ear structures grossly normal. Bone marrow signal intensity within normal limits. No scalp soft tissue abnormality. MRA HEAD FINDINGS ANTERIOR CIRCULATION: Visualized distal cervical segments of the internal carotid arteries widely patent with antegrade flow. Petrous, cavernous, and supraclinoid segments widely patent. A1 segments, anterior communicating artery common anterior cerebral arteries well opacified. M1 segments widely patent without stenosis or occlusion. Distal MCA branches well opacified and symmetric. POSTERIOR CIRCULATION: Vertebral arteries patent to the vertebrobasilar junction. Posterior inferior cerebral arteries patent proximally. Basilar artery widely patent to its distal aspect. Superior cerebral arteries patent bilaterally. Left PCA arises from the basilar artery and is well opacified to its distal aspect. Fetal type right PCA supplied via a a robust right posterior communicating artery. Right PCA also well opacified to its distal aspect. No aneurysm or vascular malformation. IMPRESSION: MRI HEAD IMPRESSION: 1. No acute intracranial  infarct or other process identified. 2. Mild generalized cerebral atrophy, stable. MRA HEAD IMPRESSION: Normal intracranial MRA. Electronically Signed   By: Jeannine Boga M.D.   On: 06/06/2016 01:29   Mr Brain Wo Contrast  Result Date: 06/06/2016 CLINICAL DATA:  Initial evaluation for acute onset left-sided facial numbness with left arm weakness. EXAM: MRI HEAD WITHOUT CONTRAST MRA HEAD WITHOUT CONTRAST TECHNIQUE: Multiplanar, multiecho pulse sequences of the brain and surrounding structures were obtained without intravenous contrast. Angiographic images of the head were obtained using MRA technique without contrast. COMPARISON:  Prior CT from earlier same day. FINDINGS: MRI HEAD FINDINGS Mild generalized cerebral atrophy is stable from previous studies. No significant cerebral white matter disease for patient age. No abnormal foci of restricted diffusion to suggest acute to subacute ischemia. Gray-white matter differentiation is well maintained. Major intracranial vascular flow voids are preserved. No evidence for acute or chronic intracranial hemorrhage. Two adjacent sub cm T2 hyperintense lucencies within the left paramedian pons, which may reflect small remote lacunar infarcts versus dilated perivascular spaces. No other evidence for remote infarction. No mass lesion, midline shift, or mass effect. No hydrocephalus. No extra-axial fluid collection. Major dural sinuses are  grossly patent. Craniocervical junction normal. Visualized upper cervical spine unremarkable. Pituitary gland within normal limits. Globes and orbits unremarkable. Mild scattered mucosal thickening within the ethmoidal air cells. Paranasal sinuses are otherwise clear. No mastoid effusion. Inner ear structures grossly normal. Bone marrow signal intensity within normal limits. No scalp soft tissue abnormality. MRA HEAD FINDINGS ANTERIOR CIRCULATION: Visualized distal cervical segments of the internal carotid arteries widely patent with  antegrade flow. Petrous, cavernous, and supraclinoid segments widely patent. A1 segments, anterior communicating artery common anterior cerebral arteries well opacified. M1 segments widely patent without stenosis or occlusion. Distal MCA branches well opacified and symmetric. POSTERIOR CIRCULATION: Vertebral arteries patent to the vertebrobasilar junction. Posterior inferior cerebral arteries patent proximally. Basilar artery widely patent to its distal aspect. Superior cerebral arteries patent bilaterally. Left PCA arises from the basilar artery and is well opacified to its distal aspect. Fetal type right PCA supplied via a a robust right posterior communicating artery. Right PCA also well opacified to its distal aspect. No aneurysm or vascular malformation. IMPRESSION: MRI HEAD IMPRESSION: 1. No acute intracranial infarct or other process identified. 2. Mild generalized cerebral atrophy, stable. MRA HEAD IMPRESSION: Normal intracranial MRA. Electronically Signed   By: Jeannine Boga M.D.   On: 06/06/2016 01:29   Dg Chest Port 1 View  Result Date: 06/06/2016 CLINICAL DATA:  Chest pain tonight. EXAM: PORTABLE CHEST 1 VIEW COMPARISON:  Radiographs 01/10/2016 FINDINGS: Heart size is normal. Mediastinal contours are unchanged. There are 2 prostatic cardiac valves. Surgical clips project over the right hilum. Minimal atelectasis or scarring at the right lung base. No focal airspace disease, pleural effusion, pulmonary edema or pneumothorax. No acute osseous abnormality. IMPRESSION: No active disease. Electronically Signed   By: Jeb Levering M.D.   On: 06/06/2016 03:10   Ct Head Code Stroke W/o Cm  Result Date: 06/05/2016 CLINICAL DATA:  Code stroke. Left-sided weakness with facial droop and slurred speech. EXAM: CT HEAD WITHOUT CONTRAST TECHNIQUE: Contiguous axial images were obtained from the base of the skull through the vertex without intravenous contrast. COMPARISON:  09/12/2015 FINDINGS: Negative  for acute hemorrhage. No visible infarct or hyperdense vessel. Generalized cerebral and cerebellar atrophy that is stable from 2016. No acute osseous, orbital, or sinus finding. ASPECTS ([place] Stroke Program Early CT Score) - Ganglionic level infarction (caudate, lentiform nuclei, internal capsule, insula, M1-M3 cortex): 7 - Supraganglionic infarction (M4-M6 cortex): 3 Total score (0-10 with 10 being normal): 10 These results were called by telephone at the time of interpretation on 06/05/2016 at 8:26 pm to Dr. Nicole Kindred, who verbally acknowledged these results. IMPRESSION: 1. Negative for hemorrhage or visible infarct. 2. ASPECTS is 10. Electronically Signed   By: Monte Fantasia M.D.   On: 06/05/2016 20:28    Scheduled Meds: . aspirin EC  81 mg Oral Daily  . atorvastatin  20 mg Oral q1800  . warfarin  10 mg Oral ONCE-1800  . Warfarin - Pharmacist Dosing Inpatient   Does not apply q1800   Continuous Infusions: . sodium chloride 100 mL/hr at 06/06/16 1134  . heparin 950 Units/hr (06/06/16 1200)     LOS: 0 days   Time spent: 25 minutes.  Vance Gather, MD [facility] Pager [phone]  If 7PM-7AM, please contact night-coverage [email or web address] Password [email or web address] 06/06/2016, 4:02 PM

## 2016-06-06 NOTE — Care Management Note (Signed)
Case Management Note  Patient Details  Name: Mary [name] MRN: [id number] Date of Birth: 05/16/1961  Subjective/Objective:    Pt in with stroke symptoms. She is from home alone.                 Action/Plan: Awaiting PT/OT recommendations. Plan is for TEE later today. CM following for d/c needs.   Expected Discharge Date:  06/06/16               Expected Discharge Plan:     In-House Referral:     Discharge planning Services     Post Acute Care Choice:    Choice offered to:     DME Arranged:    DME Agency:     HH Arranged:    HH Agency:     Status of Service:  In process, will continue to follow  If discussed at Long Length of Stay Meetings, dates discussed:    Additional Comments:  Pollie Friar, RN 06/06/2016, 11:40 AM

## 2016-06-06 NOTE — Progress Notes (Signed)
VASCULAR LAB PRELIMINARY  PRELIMINARY  PRELIMINARY  PRELIMINARY  Carotid duplex completed.    Preliminary report:  1-39% ICA plaquing.  Significant soft plaque throughout.  Vertebral artery flow is antegrade.  Possible "hits" noted with pulse wave Doppler.   Adriann Thau, RVT 06/06/2016, 2:57 PM

## 2016-06-06 NOTE — Progress Notes (Signed)
STROKE TEAM PROGRESS NOTE   HISTORY OF PRESENT ILLNESS (per record)  Mary [name] is an 55 y.o. female with a history of tricuspid and mitral valve regurgitation, mitral valve replacement, hypertension, heart block and heart failure, brought to the ED and code stroke status following acute onset of weakness and numbness involving left side. She reportedly had facial droop in addition to extremity weakness. She's been taking Coumadin for anticoagulation. INR today was 2.0. CT scan of her head showed no acute intracranial abnormality. Deficits subsequently improved. Facial droop and left upper extremity weakness resolved. He had residual left lower extremity drift and left side sensory changes at the time of this evaluation. NIH stroke score was 2. Patient also complained of chest pain. Troponin was negative.  LSN: 5:30 PM on 06/05/2016 tPA Given: No: Minimal residual deficits mRankin:   SUBJECTIVE (INTERVAL HISTORY) Her RN is at the bedside.  Overall she feels her condition is gradually improving. She still felt some left sided numbness and mild weakness, but improved. MRI and MRA negative.   OBJECTIVE Temp:  [97.8 F (36.6 C)-98.4 F (36.9 C)] 98.4 F (36.9 C) (09/11 0700) Pulse Rate:  [57-100] 92 (09/11 0700) Cardiac Rhythm: Normal sinus rhythm (09/11 0700) Resp:  [14-27] 16 (09/11 0700) BP: (113-136)/(61-118) 118/76 (09/11 0700) SpO2:  [95 %-100 %] 98 % (09/11 0700) Weight:  [63.6 kg (140 lb 3.4 oz)-65.4 kg (144 lb 2.9 oz)] 65.4 kg (144 lb 2.9 oz) (09/11 0105)  CBC:   Recent Labs Lab 06/05/16 2017 06/05/16 2022 06/06/16 1036  WBC 10.4  --  8.4  NEUTROABS 7.9*  --   --   HGB 13.9 14.6 13.7  HCT 38.8 43.0 39.7  MCV 97.5  --  100.0  PLT 375  --  407*    Basic Metabolic Panel:   Recent Labs Lab 06/05/16 2017 06/05/16 2022 06/06/16 1036  NA 125* 127* 133*  K 4.3 4.4 4.0  CL 97* 100* 103  CO2 16*  --  18*  GLUCOSE 91 87 97  BUN 9 10 11   CREATININE 1.44* 1.40*  1.09*  CALCIUM 9.0  --  9.1    Lipid Panel:     Component Value Date/Time   CHOL 212 (H) 06/06/2016 0135   TRIG 108 06/06/2016 0135   HDL 66 06/06/2016 0135   CHOLHDL 3.2 06/06/2016 0135   VLDL 22 06/06/2016 0135   LDLCALC 124 (H) 06/06/2016 0135   HgbA1c:  Lab Results  Component Value Date   HGBA1C 5.4 09/13/2015   Urine Drug Screen:     Component Value Date/Time   LABOPIA NONE DETECTED 06/05/2016 2108   COCAINSCRNUR NONE DETECTED 06/05/2016 2108   LABBENZ NONE DETECTED 06/05/2016 2108   AMPHETMU NONE DETECTED 06/05/2016 2108   THCU POSITIVE (A) 06/05/2016 2108   LABBARB NONE DETECTED 06/05/2016 2108      IMAGING I have personally reviewed the radiological images below and agree with the radiology interpretations.  Mr Mary [name] Head Wo Contrast 06/06/2016 MRI HEAD IMPRESSION:  1. No acute intracranial infarct or other process identified.  2. Mild generalized cerebral atrophy, stable.  MRA HEAD IMPRESSION:  Normal intracranial MRA.   Dg Chest Port 1 View 06/06/2016 No active disease.   Ct Head Code Stroke W/o Cm 06/05/2016 1. Negative for hemorrhage or visible infarct.  2. ASPECTS is 10.   CUS - 1-39% ICA plaquing.  Significant soft plaque throughout.  Vertebral artery flow is antegrade.  Possible "hits" noted with pulse wave Doppler.  TTE - pending   PHYSICAL EXAM HEENT-  Normocephalic, no lesions, without obvious abnormality.  Normal external eye and conjunctiva.  Normal TM's bilaterally.  Normal auditory canals and external ears. Normal external nose, mucus membranes and septum.  Normal pharynx. Neck supple with no masses, nodes, nodules or enlargement. Cardiovascular - regular rate and rhythm, mechanical valve sound noted Lungs - chest clear, no wheezing, rales, normal symmetric air entry Abdomen - soft, non-tender; bowel sounds normal; no masses,  no organomegaly Extremities - no joint deformities, effusion, or inflammation  Neurologic  Examination: Mental Status: Alert, oriented, thought content appropriate.  Speech fluent without evidence of aphasia. Able to follow commands without difficulty. Cranial Nerves: II-Visual fields were normal. III/IV/VI-Pupils were equal and reacted normally to light. Extraocular movements were full and conjugate.    V/VII-no facial weakness, but complains of decreased light touch on the left, about 80% of the right. VIII-normal. X-normal speecht. XI: trapezius strength/neck flexion strength normal bilaterally XII-midline tongue extension with normal strength. Motor: Mild drift of left lower extremity, however, lack of effort; also giveaway weakness at right DF and PF. otherwise unremarkable motor exam Sensory: Reduced perception of tactile sensation over left extremities, 80% compared to right extremities. Deep Tendon Reflexes: 2+ and symmetric. Plantars: Mute bilaterally Cerebellar: Normal finger-to-nose testing. Gait: not tested  ASSESSMENT/PLAN Ms. Mary [name] is a 55 y.o. 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 INR was 2.0.  She did not receive IV t-PA due to Coumadin therapy.  TIA:  Right brain TIA probably cardioembolic due to mechanical valve with subtherapeutic INR  Resultant  Left subjective decreased sensation and giveaway weakness at left LE  MRI  - no acute infarct  MRA - normal  Carotid Doppler no significant stenosis, although soft plaque throughout  2D Echo - pending  LDL - 124  HgbA1c pending  VTE prophylaxis - Coumadin and heparin IV Diet Heart Room service appropriate? Yes; Fluid consistency: Thin  aspirin 81 mg daily and warfarin daily prior to  admission, now on aspirin 81 mg daily, heparin IV and warfarin daily. Continue all 3 but stop heparin IV once INR 2.5 to 3.5  Patient counseled to be compliant with her antithrombotic medications  Ongoing aggressive stroke risk factor management  Therapy recommendations: - Outpatient physical therapy recommended. OT evaluation pending  Disposition:  Pending  Mechanical valve on coumadin  INR on admission 2.0  INR goal 2.5-3.5  On heparin IV bridge to coumadin. Also on ASA 81  Hypertension  Stable  Permissive hypertension (OK if < 220/120) but gradually normalize in 5-7 days  Long-term BP goal normotensive  Hyperlipidemia  Home meds: No lipid lowering medications prior to admission  LDL 124, goal < 70  Will start Lipitor 20 mg daily  Continue statin at discharge  Other Stroke Risk Factors  Former cigarette smoker - patient quit smoking 3 years ago  ETOH use, advised to drink no more than 1 - 2 drink(s) a day  Hx stroke/TIA  Family hx stroke (father)  Other Active Problems  Mildly elevated creatinine - improving  UDS - [facility] day # 0  Neurology will sign off. Please call with questions. Pt will follow up with Cecille Rubin NP at [facility] in about 2 months.  Thanks for the consult.  Rosalin Hawking, MD PhD Stroke Neurology 06/06/2016 3:35 PM      To contact Stroke Continuity provider, please refer to [email or web address] After hours, contact General Neurology

## 2016-06-06 NOTE — ED Notes (Signed)
Patient transported to MRI 

## 2016-06-06 NOTE — Progress Notes (Signed)
Patient admitted to unit from E.D via MRI. Alert alert and orientedX4  Tele set up and verified   Left leg weakness and left arm numbness persist. Vital signs stable no skin issues.

## 2016-06-06 NOTE — Progress Notes (Signed)
[place] for heparin / Coumadin Indication: MVR w/ low INR and acute CVA/TIA  Allergies  Allergen Reactions  . Oxycodone Itching    Patient Measurements: Height: 5\' 2"  (157.5 cm) Weight: 144 lb 2.9 oz (65.4 kg) IBW/kg (Calculated) : 50.1  Vital Signs: Temp: 98.4 F (36.9 C) (09/11 0700) Temp Source: Oral (09/11 0700) BP: 118/76 (09/11 0700) Pulse Rate: 92 (09/11 0700)  Labs:  Recent Labs  06/05/16 2017 06/05/16 2022 06/06/16 0136 06/06/16 1036  HGB 13.9 14.6  --  13.7  HCT 38.8 43.0  --  39.7  PLT 375  --   --  407*  APTT 40*  --   --   --   LABPROT 23.0*  --   --  22.7*  INR 2.00  --   --  1.97  HEPARINUNFRC  --   --   --  0.33  CREATININE 1.44* 1.40*  --   --   TROPONINI  --   --  <0.03  --     Estimated Creatinine Clearance: 41.2 mL/min (by C-G formula based on SCr of 1.4 mg/dL).    Assessment: 55yo female to receive heparin bridge for low INR w/ MVR in setting of acute CVA/TIA. HL therapeutic at 0.33  Goal of Therapy:  Heparin level 0.3-0.5 units/ml Monitor platelets by anticoagulation protocol: Yes  INR goal of 2.5 to 3.5   Plan:  Heparin to 950 units / hr Coumadin 10 mg po x 1 Daily labs  Thank you Anette Guarneri, PharmD [phone]   06/06/2016,11:32 AM

## 2016-06-06 NOTE — Progress Notes (Signed)
ANTICOAGULATION CONSULT NOTE - Initial Consult  Pharmacy Consult for heparin Indication: MVR w/ low INR and acute CVA/TIA  Allergies  Allergen Reactions  . Oxycodone Itching    Patient Measurements: Height: 5\' 2"  (157.5 cm) Weight: 144 lb 2.9 oz (65.4 kg) IBW/kg (Calculated) : 50.1  Vital Signs: Temp: 98 F (36.7 C) (09/11 0105) Temp Source: Oral (09/11 0105) BP: 113/74 (09/11 0105) Pulse Rate: 93 (09/11 0105)  Labs:  Recent Labs  06/05/16 2017 06/05/16 2022  HGB 13.9 14.6  HCT 38.8 43.0  PLT 375  --   APTT 40*  --   LABPROT 23.0*  --   INR 2.00  --   CREATININE 1.44* 1.40*    Estimated Creatinine Clearance: 41.2 mL/min (by C-G formula based on SCr of 1.4 mg/dL).   Medical History: Past Medical History:  Diagnosis Date  . Acute diastolic heart failure ([place]) 03/15/2013  . Anemia   . Asthma    "as a baby"   . Carotid stenosis    Carotid US 2/17: bilateral ICA 1-39% >> FU prn  . Complete heart block ([place]) 05/28/2013   Post-op  . Epigastric hernia 200's  . GERD (gastroesophageal reflux disease)   . History of blood transfusion    "14 w/1st pregnancy; 2 w/last C-section" (03/15/2013)  . Hx of echocardiogram    a. Echo 4/16:  Mild focal basal septal hypertrophy, EF 60-65%, no RWMA, Mechanical MVR ok with mild central regurgitation and no perivalvular leak, small mobile density attached to valvular ring (1.5x1 cm) - post surgical changes vs SBE, mild LAE;   b. Echo 2/17:  Mild post wall LVH, EF 55-60%, no RWMA, mechanical MVR ok (mean 5 mmHg), normal RVSF  . Hypertension    no pcp   will go to [facility]  . 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 S/P mitral valve replacement with metallic valve [id number]   87mm Sorin Carbomedics mechanical prosthesis via right mini thoracotomy approach  . 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 Severe mitral regurgitation 04/22/2013  . SVT (supraventricular tachycardia) ([place]) 03/16/2013  . Tricuspid regurgitation     Medications:  Prescriptions Prior to Admission  Medication Sig Dispense Refill Last Dose  . acetaminophen (TYLENOL) 500 MG tablet Take 500 mg by mouth every 6 (six) hours as needed for mild pain or moderate pain.   06/05/2016 at Unknown time  . aspirin EC 81 MG tablet Take 1 tablet (81 mg total) by mouth daily.   06/04/2016 at Unknown time  . warfarin (COUMADIN) 5 MG tablet Take 5-7.5 mg by mouth daily. Take 1 and 1/2 tablet on Mon wed and Fridays rest of the days take a whole tablet   06/05/2016 at 1800  . WITCH HAZEL EX Apply 1 application topically at bedtime as needed (knee pain).    06/04/2016 at Unknown time    Assessment: 55yo female to receive heparin bridge for low INR w/ MVR in setting of acute CVA/TIA.  Goal of Therapy:  Heparin level 0.3-0.5 units/ml Monitor platelets by anticoagulation protocol: Yes   Plan:  Will start heparin gtt at 900 units/hr and monitor heparin levels and CBC.  Wynona Neat, PharmD, BCPS  06/06/2016,1:36 AM

## 2016-06-06 NOTE — Evaluation (Signed)
Physical Therapy Evaluation Patient Details Name: Mary [name] MRN: [id number] DOB: 1961/03/06 Today's Date: 06/06/2016   History of Present Illness  Patient is a 55 y/o female with hx of rheumatic mitral stenosis with regurgitation s/p MVR and tricuspid repair surgery 2014, HTN, anemia, acute diastolic heart failure presents with left sided weakness/numbness. Head CT, MRI- unremarkable. INR 2 so Heparin initiated.   Clinical Impression  Patient presents with left sided weakness/numbness s/p above. Tolerated gait training with Min guard assist due to imbalance. Pt lives alone and has 2 flights of stairs to get into apt. Pt is currently on anticoagulates due to hx of MVR so concerned about fall risk. Will need to see if pt can stay with daughter at d/c. Will plan for stair training next session as tolerated and higher level balance training. Will follow acutely to maximize independence and mobility prior to return home.    Follow Up Recommendations Outpatient PT;Supervision - Intermittent    Equipment Recommendations  None recommended by PT    Recommendations for Other Services       Precautions / Restrictions Precautions Precautions: Fall Restrictions Weight Bearing Restrictions: No      Mobility  Bed Mobility Overal bed mobility: Needs Assistance Bed Mobility: Supine to Sit     Supine to sit: Supervision;HOB elevated     General bed mobility comments: Heavy use of rail to get to EOB and using RUE to bring LLE to EOb. Guarding LUE throughout movement.  Transfers Overall transfer level: Needs assistance Equipment used: None Transfers: Sit to/from Stand Sit to Stand: Min guard         General transfer comment: Min guard to steady upon standing. Transferred to chair post ambulation bout.  Ambulation/Gait Ambulation/Gait assistance: Min guard Ambulation Distance (Feet): 250 Feet Assistive device: None Gait Pattern/deviations: Step-through pattern;Decreased stride  length Gait velocity: decreased Gait velocity interpretation: Below normal speed for age/gender General Gait Details: Mild left knee instability noted but no buckling. Burning reported in LLE during mobility.   Stairs            Wheelchair Mobility    Modified Rankin (Stroke Patients Only) Modified Rankin (Stroke Patients Only) Pre-Morbid Rankin Score: Slight disability Modified Rankin: Moderate disability     Balance Overall balance assessment: Needs assistance Sitting-balance support: Feet supported;No upper extremity supported Sitting balance-Leahy Scale: Good Sitting balance - Comments: Able to adjust socks without difficulty reaching outside BoS.   Standing balance support: During functional activity Standing balance-Leahy Scale: Fair                               Pertinent Vitals/Pain Pain Assessment: No/denies pain    Home Living Family/patient expects to be discharged to:: Private residence Living Arrangements: Alone Available Help at Discharge: Family;Available PRN/intermittently Type of Home: Apartment Home Access: Stairs to enter Entrance Stairs-Rails: Right;Left Entrance Stairs-Number of Steps: 2 flights Home Layout: One level Home Equipment: None      Prior Function Level of Independence: Independent         Comments: Does not drive. Family assists with groceries and going to appts. Reports getting short of breath walking to the mailbox so only goes every other week.     Hand Dominance   Dominant Hand: Right    Extremity/Trunk Assessment   Upper Extremity Assessment: Defer to OT evaluation           Lower Extremity Assessment: LLE deficits/detail   LLE  Deficits / Details: Grossly ~3/5 knee extension, 2+/5 knee flexion, 3/5 hip flexion.      Communication   Communication: No difficulties  Cognition Arousal/Alertness: Awake/alert Behavior During Therapy: WFL for tasks assessed/performed Overall Cognitive Status:  Within Functional Limits for tasks assessed                      General Comments General comments (skin integrity, edema, etc.): mild facial droop noted. Numbness left side of face.     Exercises        Assessment/Plan    PT Assessment Patient needs continued PT services  PT Diagnosis Difficulty walking;Generalized weakness   PT Problem List Decreased strength;Decreased mobility;Decreased range of motion;Decreased activity tolerance;Decreased balance;Decreased knowledge of use of DME;Impaired sensation;Cardiopulmonary status limiting activity  PT Treatment Interventions Therapeutic activities;Gait training;Therapeutic exercise;Patient/family education;Balance training;Stair training;Neuromuscular re-education;Functional mobility training   PT Goals (Current goals can be found in the Care Plan section) Acute Rehab PT Goals Patient Stated Goal: to return to PLOF PT Goal Formulation: With patient Time For Goal Achievement: 06/20/16 Potential to Achieve Goals: Good    Frequency Min 4X/week   Barriers to discharge Decreased caregiver support;Inaccessible home environment 2 flights of stairs to get to apt and lives alone    Co-evaluation               End of Session Equipment Utilized During Treatment: Gait belt Activity Tolerance: Patient tolerated treatment well Patient left: in chair;with call bell/phone within reach Nurse Communication: Mobility status    Functional Assessment Tool Used: clinical judgment Functional Limitation: Mobility: Walking and moving around Mobility: Walking and Moving Around Current Status [id number]): At least 1 percent but less than 20 percent impaired, limited or restricted Mobility: Walking and Moving Around Goal Status [phone]): At least 1 percent but less than 20 percent impaired, limited or restricted    Time: [id number] PT Time Calculation (min) (ACUTE ONLY): 25 min   Charges:   PT Evaluation $PT Eval Moderate Complexity: 1  Procedure PT Treatments $Gait Training: 8-22 mins   PT G Codes:   PT G-Codes **NOT FOR INPATIENT CLASS** Functional Assessment Tool Used: clinical judgment Functional Limitation: Mobility: Walking and moving around Mobility: Walking and Moving Around Current Status [id number]): At least 1 percent but less than 20 percent impaired, limited or restricted Mobility: Walking and Moving Around Goal Status [phone]): At least 1 percent but less than 20 percent impaired, limited or restricted    [place] 06/06/2016, 11:56 AM Wray Kearns, PT, DPT [phone]

## 2016-06-06 NOTE — Evaluation (Signed)
Occupational Therapy Evaluation Patient Details Name: Mary [name] MRN: [id number] DOB: 1961-07-24 Today's Date: 06/06/2016    History of Present Illness Patient is a 55 y/o female with hx of rheumatic mitral stenosis with regurgitation s/p MVR and tricuspid repair surgery 2014, HTN, anemia, acute diastolic heart failure presents with left sided weakness/numbness. Head CT, MRI- unremarkable. INR 2 so Heparin initiated.    Clinical Impression   Pt demonstrated modified independence to independence for all selfcare tasks without LOB noted.  No cognitive deficits or functional deficits noted in the LUE with testing.  No further OT needs at this time.     Follow Up Recommendations  No OT follow up    Equipment Recommendations  None recommended by OT       Precautions / Restrictions Precautions Precautions: None Restrictions Weight Bearing Restrictions: No      Mobility Bed Mobility Overal bed mobility: Independent             General bed mobility comments: Pt able to transfer out and into bed without assistance.  Noted pt standing at EOB when therapist entered room.  Transfers Overall transfer level: Independent Equipment used: None Transfers: Sit to/from Stand Sit to Stand: Independent         General transfer comment: Pt able to complete sit to stand from lower toilet and EOB without UE use.     Balance Overall balance assessment: Modified Independent   Sitting balance-Leahy Scale: Normal       Standing balance-Leahy Scale: Good Standing balance comment: No LOB with functional mobility with head turns, reaching to the floor, standing with feet together and eyes closed, and with standing on unilateral LEs.                              ADL Overall ADL's : Independent                                       General ADL Comments: Pt demonstrates independence with simulated selfcare tasks as well as for toilet transfers using the  regular toilet in her room.  She was able to retrieve items from the floor as well as stand on each leg independently for 10 seconds without LOB.  Increased sway with tandem standing but no LOB noted.       Vision Vision Assessment?: No apparent visual deficits   Perception Perception Perception Tested?: Yes Inattention/Neglect: Appears intact   Praxis Praxis Praxis tested?: Within functional limits    Pertinent Vitals/Pain Pain Assessment: No/denies pain     Hand Dominance Right   Extremity/Trunk Assessment Upper Extremity Assessment Upper Extremity Assessment: Overall WFL for tasks assessed (AROM, strength, and coordination WFLs for BUEs)   Lower Extremity Assessment Lower Extremity Assessment: Defer to PT evaluation       Communication Communication Communication: No difficulties   Cognition Arousal/Alertness: Awake/alert Behavior During Therapy: WFL for tasks assessed/performed Overall Cognitive Status: Within Functional Limits for tasks assessed                                Home Living Family/patient expects to be discharged to:: Private residence Living Arrangements: Alone Available Help at Discharge: Family;Available PRN/intermittently Type of Home: Apartment Home Access: Stairs to enter Entrance Stairs-Number of Steps: 2 flights Entrance Stairs-Rails: Right;Left  Home Layout: One level     Bathroom Shower/Tub: Producer, television/film/video: Standard     Home Equipment: None          Prior Functioning/Environment Level of Independence: Independent        Comments: Does not drive. Family assists with groceries and going to appts.                              End of Session Equipment Utilized During Treatment: Gait belt Nurse Communication: Mobility status  Activity Tolerance: Patient tolerated treatment well Patient left: in bed;with call bell/phone within reach   Time: 1530-1552 OT Time Calculation (min): 22  min Charges:  OT General Charges $OT Visit: 1 Procedure OT Evaluation $OT Eval Moderate Complexity: 1 Procedure G-Codes: OT G-codes **NOT FOR INPATIENT CLASS** Functional Assessment Tool Used: Clinical judgement Functional Limitation: Self care Self Care Current Status (Q5956): 0 percent impaired, limited or restricted Self Care Goal Status (L8756): 0 percent impaired, limited or restricted Self Care Discharge Status (E3329): 0 percent impaired, limited or restricted  Mary [name] OTR/L 06/06/2016, 4:16 PM

## 2016-06-06 NOTE — Progress Notes (Signed)
ANTICOAGULATION CONSULT NOTE - Initial Consult  Pharmacy Consult for Coumadin Indication: MVR  Allergies  Allergen Reactions  . Oxycodone Itching    Patient Measurements: Weight: 140 lb 3.4 oz (63.6 kg)  Vital Signs: Temp: 97.8 F (36.6 C) (09/10 2321) Temp Source: Oral (09/10 2032) BP: 127/96 (09/10 2303) Pulse Rate: 96 (09/10 2303)  Labs:  Recent Labs  06/05/16 2017 06/05/16 2022  HGB 13.9 14.6  HCT 38.8 43.0  PLT 375  --   APTT 40*  --   LABPROT 23.0*  --   INR 2.00  --   CREATININE 1.44* 1.40*    Estimated Creatinine Clearance: 40.7 mL/min (by C-G formula based on SCr of 1.4 mg/dL).   Medical History: Past Medical History:  Diagnosis Date  . Acute diastolic heart failure ([place]) 03/15/2013  . Anemia   . Asthma    "as a baby"   . Carotid stenosis    Carotid US 2/17: bilateral ICA 1-39% >> FU prn  . Complete heart block ([place]) 05/28/2013   Post-op  . Epigastric hernia 200's  . GERD (gastroesophageal reflux disease)   . History of blood transfusion    "14 w/1st pregnancy; 2 w/last C-section" (03/15/2013)  . Hx of echocardiogram    a. Echo 4/16:  Mild focal basal septal hypertrophy, EF 60-65%, no RWMA, Mechanical MVR ok with mild central regurgitation and no perivalvular leak, small mobile density attached to valvular ring (1.5x1 cm) - post surgical changes vs SBE, mild LAE;   b. Echo 2/17:  Mild post wall LVH, EF 55-60%, no RWMA, mechanical MVR ok (mean 5 mmHg), normal RVSF  . Hypertension    no pcp   will go to [facility]  . 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 S/P mitral valve replacement with metallic valve [id number]   19mm Sorin Carbomedics mechanical prosthesis via right mini thoracotomy approach  . 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 Severe mitral regurgitation  04/22/2013  . SVT (supraventricular tachycardia) ([place]) 03/16/2013  . Tricuspid regurgitation     Medications:  Prescriptions Prior to Admission  Medication Sig Dispense Refill Last Dose  . acetaminophen (TYLENOL) 500 MG tablet Take 500 mg by mouth every 6 (six) hours as needed for mild pain or moderate pain.   06/05/2016 at Unknown time  . aspirin EC 81 MG tablet Take 1 tablet (81 mg total) by mouth daily.   06/04/2016 at Unknown time  . warfarin (COUMADIN) 5 MG tablet Take 5-7.5 mg by mouth daily. Take 1 and 1/2 tablet on Mon wed and Fridays rest of the days take a whole tablet   06/05/2016 at 1800  . WITCH HAZEL EX Apply 1 application topically at bedtime as needed (knee pain).    06/04/2016 at Unknown time    Assessment: 55yo female c/o central CP then left-sided weakness, neuro notes probable left subcortical MCA TIA or stroke, to continue Coumadin for MVR; INR currently subtherapeutic w/ last dose of Coumadin taken 9/10.  Goal of Therapy:  INR 2.5-3.5   Plan:  Will hold off on giving boosted Coumadin dose tonight while awaiting swallow screen and monitor INR for dose adjustments.  Wynona Neat, PharmD, BCPS  06/06/2016,1:10 AM

## 2016-06-07 LAB — BASIC METABOLIC PANEL
Anion gap: 5 (ref 5–15)
BUN: 8 mg/dL (ref 6–20)
CHLORIDE: 109 mmol/L (ref 101–111)
CO2: 23 mmol/L (ref 22–32)
CREATININE: 0.96 mg/dL (ref 0.44–1.00)
Calcium: 8.8 mg/dL — ABNORMAL LOW (ref 8.9–10.3)
GFR calc Af Amer: >60 mL/min (ref >60)
GFR calc non Af Amer: >60 mL/min (ref >60)
Glucose, Bld: 86 mg/dL (ref 65–99)
Potassium: 4.6 mmol/L (ref 3.5–5.1)
SODIUM: 137 mmol/L (ref 135–145)

## 2016-06-07 LAB — CBC
HCT: 36.4 % (ref 36.0–46.0)
HEMOGLOBIN: 12.2 g/dL (ref 12.0–15.0)
MCH: 34.3 pg — AB (ref 26.0–34.0)
MCHC: 33.5 g/dL (ref 30.0–36.0)
MCV: 102.2 fL — ABNORMAL HIGH (ref 78.0–100.0)
Platelets: 353 10*3/uL (ref 150–400)
RBC: 3.56 MIL/uL — ABNORMAL LOW (ref 3.87–5.11)
RDW: 13.9 % (ref 11.5–15.5)
WBC: 6.8 10*3/uL (ref 4.0–10.5)

## 2016-06-07 LAB — HEPARIN LEVEL (UNFRACTIONATED): Heparin Unfractionated: 0.55 IU/mL (ref 0.30–0.70)

## 2016-06-07 LAB — HEMOGLOBIN A1C
HEMOGLOBIN A1C: 4.8 % (ref 4.8–5.6)
MEAN PLASMA GLUCOSE: 91 mg/dL

## 2016-06-07 LAB — PROTIME-INR
INR: 1.87
PROTHROMBIN TIME: 21.8 s — AB (ref 11.4–15.2)

## 2016-06-07 MED ORDER — WARFARIN SODIUM 5 MG PO TABS
10.0000 mg | ORAL_TABLET | Freq: Once | ORAL | Status: AC
  Administered 2016-06-07: 10 mg via ORAL
  Filled 2016-06-07: qty 2

## 2016-06-07 NOTE — Progress Notes (Addendum)
[facility] for heparin / Coumadin Indication: MVR w/ low INR and acute CVA/TIA  Allergies  Allergen Reactions  . Oxycodone Itching    Patient Measurements: Height: 5\' 2"  (157.5 cm) Weight: 144 lb 2.9 oz (65.4 kg) IBW/kg (Calculated) : 50.1  Vital Signs: Temp: 98.2 F (36.8 C) (09/12 1025) Temp Source: Oral (09/12 1025) BP: 121/71 (09/12 1025) Pulse Rate: 87 (09/12 1025)  Labs:  Recent Labs  06/05/16 2017 06/05/16 2022 06/06/16 0136 06/06/16 1036 06/06/16 1345 06/07/16 0447 06/07/16 0448  HGB 13.9 14.6  --  13.7  --  12.2  --   HCT 38.8 43.0  --  39.7  --  36.4  --   PLT 375  --   --  407*  --  353  --   APTT 40*  --   --   --   --   --   --   LABPROT 23.0*  --   --  22.7*  --   --  21.8*  INR 2.00  --   --  1.97  --   --  1.87  HEPARINUNFRC  --   --   --  0.33  --   --  0.55  CREATININE 1.44* 1.40*  --  1.09*  --  0.96  --   TROPONINI  --   --  <0.03 <0.03 <0.03  --   --     Estimated Creatinine Clearance: 60.1 mL/min (by C-G formula based on SCr of 0.96 mg/dL).    Assessment: 55yo female to receive heparin bridge for low INR w/ MVR in setting of acute CVA/TIA. HL slightly high at 0.55 INR = 1.87  Goal of Therapy:  Heparin level 0.3-0.5 units/ml Monitor platelets by anticoagulation protocol: Yes  INR goal of 2.5 to 3.5   Plan:  Heparin to 900 units / hr Repeat Coumadin 10 mg po x 1 Daily labs  Thank you Anette Guarneri, PharmD [phone]   06/07/2016,11:29 AM

## 2016-06-07 NOTE — Progress Notes (Addendum)
PROGRESS NOTE  Mary [name]  [id number] DOB: 1961-05-18 DOA: 06/05/2016 PCP: Loralie Champagne, MD  Outpatient Specialists: Dr. Aundra Dubin, cardiology.  Brief Narrative: Mary [name] is a 55 y.o. woman with a history of mechanical MVR and TV repair Aug 2014 on coumadin, HTN, GERD, and history of TIA in the setting of subtherapeutic INR who presented to the ED after developing left sided facial droop and weakness in left upper and lower extremities. She subsequently developed chest pain and shortness of breath. She reported adherence to warfarin dosing, though INR was subtherapeutic at 2.0. Code Stroke was called, head CT did not show acute CVA. Symptoms rapidly resolved and further evaluation with MRI was negative. Echo showed preserved EF and normally functioning MV prosthesis. Carotid U/S report pending. She remains on heparin gtt per pharmacy as INR remains subtherapeutic.  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 This is a recurrent issue: She had TIA Dec 2016 with subtherapeutic INR. Other RF's: former tobacco smoker, +FH in father.  - Symptoms resolved and MRI/MRA do not show acute infarct or critical stenosis, AVM or aneurysm.  - Continue ASA 81mg  and coumadin after discharge, requires bridge as below.  - LDL 124, Hb A1c pending: Started lipitor 20mg .  - PT/OT/Speech: outpatient PT recommended for now.  - Echo without evident thromboembolic source, and carotid ultrasounds pending. - Continue telemetry  Mechanical MVR with subtherapeutic INR: She reports adherence with  dosing and INR checks at [facility] cardiology. In fact, last EMR reported INR PTA was 3.5 on March 18, 2016.  - Heparin infusion until INR in therapeutic range (2.5 - 3.5) - Greatly appreciate pharmacy's help with management: They believe discharge may be possible in 24 - 48 hours with INR > 2.   Chest pain, atypical, resolved: Troponins negative,  ECG unchanged, echo without wall motion abnormalities.  - No further work up indicated  AKI --Hydrate with NS, repeat BMP in the AM  DVT prophylaxis: IV heparin, coumadin Code Status: Full Family Communication: Discussed with patient. She declined offer to contact family at this time.  Disposition Plan: Discharge home in next 24 - 48 hours. Ongoing heparin infusion pending therapeutic INR.   Consultants:   Neurology, Dr. Erlinda Hong, signed off 9/11  Procedures:   Echo 06/06/2016: - Left ventricle: The cavity size was normal. Wall thickness was   normal. Systolic function was normal. The estimated ejection   fraction was in the range of 55% to 60%. Although no diagnostic   regional wall motion abnormality was identified, this possibility   cannot be completely excluded on the basis of this study. - Mitral valve: A mechanical prosthesis was present and functioning   normally. Valve area by continuity equation (using LVOT flow):   1.42 cm^2. - Right atrium: The atrium was mildly dilated. - Tricuspid valve: Prior procedures included surgical repair. A   ring annulus prosthesis was present and functioning normally.  Impressions:  - No change in mitral or tricuspid gradients compared to earlier   this year. Current study at average heart rate 20f 88 bpm.  Antimicrobials:  None   Subjective: No events  overnight, mild right arm weakness continues. No palpitations, dyspnea, or chest pain  Objective: Vitals:   06/06/16 2123 06/07/16 0137 06/07/16 0532 06/07/16 1025  BP: 120/83 116/76 116/65 121/71  Pulse: 93 94 90 87  Resp: 18 18 18 17     Temp: 98.7 F (37.1 C) 98.2 F (36.8 C) 98.4 F (36.9 C) 98.2 F (36.8 C)  TempSrc: Oral Oral Oral Oral  SpO2: 98% 100% 99% 99%  Weight:      Height:        Intake/Output Summary (Last 24 hours) at 06/07/16 1149 Last data filed at 06/06/16 1700  Gross per 24 hour  Intake              360 ml  Output                0 ml  Net              360 ml   Filed Weights   06/05/16 2030 06/06/16 0100 06/06/16 0105  Weight: 63.6 kg (140 lb 3.4 oz) 63.6 kg (140 lb 3.4 oz) 65.4 kg (144 lb 2.9 oz)    Examination: General exam: 55 y.o. female in no distress. Respiratory system: Non-labored breathing. Clear to auscultation bilaterally.  Cardiovascular system: Regular rate and rhythm. + click, I/VI murmur, no rub, or gallop. No JVD, and no pedal edema. Gastrointestinal system: Abdomen soft, non-tender, non-distended, with normoactive bowel sounds. No organomegaly or masses felt. Central nervous system: Alert and oriented. Mild decreased strength of LLE. Paresthesias LLE and LUE but SILT. Speech normal.  Extremities: Warm, no deformities Skin: No rashes, lesions no ulcers Psychiatry: Judgement and insight appear normal. Mood & affect appropriate.   Data Reviewed: I have personally reviewed following labs and imaging studies  CBC:  Recent Labs Lab 06/05/16 2017 06/05/16 2022 06/06/16 1036 06/07/16 0447  WBC 10.4  --  8.4 6.8  NEUTROABS 7.9*  --   --   --   HGB 13.9 14.6 13.7 12.2  HCT 38.8 43.0 39.7 36.4  MCV 97.5  --  100.0 102.2*  PLT 375  --  407* 0000000   Basic Metabolic Panel:  Recent Labs Lab 06/05/16 2017 06/05/16 2022 06/06/16 1036 06/07/16 0447  NA 125* 127* 133* 137  K 4.3 4.4 4.0 4.6  CL 97* 100* 103 109  CO2 16*  --  18* 23  GLUCOSE 91 87 97 86  BUN 9 10 11 8   CREATININE 1.44* 1.40* 1.09* 0.96  CALCIUM 9.0  --  9.1 8.8*   GFR: Estimated Creatinine Clearance: 60.1 mL/min (by C-G formula based on SCr of 0.96 mg/dL). Liver Function Tests:  Recent Labs Lab  06/05/16 2017  AST 29  ALT 13*  ALKPHOS 111  BILITOT 0.4  PROT 6.2*  ALBUMIN 3.2*   No results for input(s): LIPASE, AMYLASE in the last 168 hours. No results for input(s): AMMONIA in the last 168 hours. Coagulation Profile:  Recent Labs Lab 06/05/16 2017 06/06/16 1036 06/07/16 0448  INR 2.00 1.97 1.87   Cardiac Enzymes:  Recent Labs Lab 06/06/16 0136 06/06/16 1036 06/06/16 1345  TROPONINI <0.03 <0.03 <0.03   BNP (last 3 results) No results for input(s): PROBNP in the last 8760 hours. HbA1C:  Recent Labs  06/06/16 0135  HGBA1C 4.8   CBG:  Recent Labs Lab 06/05/16 2013  GLUCAP 94   Lipid Profile:  Recent Labs  06/06/16 0135  CHOL 212*  HDL 66  LDLCALC 124*  TRIG 108  CHOLHDL  3.2   Thyroid Function Tests: No results for input(s): TSH, T4TOTAL, FREET4, T3FREE, THYROIDAB in the last 72 hours. Anemia Panel: No results for input(s): VITAMINB12, FOLATE, FERRITIN, TIBC, IRON, RETICCTPCT in the last 72 hours. Urine analysis:    Component Value Date/Time   COLORURINE YELLOW 06/05/2016 2108   APPEARANCEUR CLOUDY (A) 06/05/2016 2108   LABSPEC 1.012 06/05/2016 2108   PHURINE 5.5 06/05/2016 2108   GLUCOSEU NEGATIVE 06/05/2016 2108   HGBUR LARGE (A) 06/05/2016 2108   BILIRUBINUR NEGATIVE 06/05/2016 2108   [place] NEGATIVE 06/05/2016 2108   PROTEINUR 30 (A) 06/05/2016 2108   UROBILINOGEN 0.2 06/04/2015 1533   NITRITE NEGATIVE 06/05/2016 2108   LEUKOCYTESUR TRACE (A) 06/05/2016 2108   Sepsis Labs: @LABRCNTIP (procalcitonin:4,lacticidven:4)  )No results found for this or any previous visit (from the past 240 hour(s)).   Radiology Studies: Mr Virgel Paling [id number] Contrast  Result Date: 06/06/2016 CLINICAL DATA:  Initial evaluation for acute onset left-sided facial numbness with left arm weakness. EXAM: MRI HEAD WITHOUT CONTRAST MRA HEAD WITHOUT CONTRAST TECHNIQUE: Multiplanar, multiecho pulse sequences of the brain and surrounding structures were obtained  without intravenous contrast. Angiographic images of the head were obtained using MRA technique without contrast. COMPARISON:  Prior CT from earlier same day. FINDINGS: MRI HEAD FINDINGS Mild generalized cerebral atrophy is stable from previous studies. No significant cerebral white matter disease for patient age. No abnormal foci of restricted diffusion to suggest acute to subacute ischemia. Gray-white matter differentiation is well maintained. Major intracranial vascular flow voids are preserved. No evidence for acute or chronic intracranial hemorrhage. Two adjacent sub cm T2 hyperintense lucencies within the left paramedian pons, which may reflect small remote lacunar infarcts versus dilated perivascular spaces. No other evidence for remote infarction. No mass lesion, midline shift, or mass effect. No hydrocephalus. No extra-axial fluid collection. Major dural sinuses are grossly patent. Craniocervical junction normal. Visualized upper cervical spine unremarkable. Pituitary gland within normal limits. Globes and orbits unremarkable. Mild scattered mucosal thickening within the ethmoidal air cells. Paranasal sinuses are otherwise clear. No mastoid effusion. Inner ear structures grossly normal. Bone marrow signal intensity within normal limits. No scalp soft tissue abnormality. MRA HEAD FINDINGS ANTERIOR CIRCULATION: Visualized distal cervical segments of the internal carotid arteries widely patent with antegrade flow. Petrous, cavernous, and supraclinoid segments widely patent. A1 segments, anterior communicating artery common anterior cerebral arteries well opacified. M1 segments widely patent without stenosis or occlusion. Distal MCA branches well opacified and symmetric. POSTERIOR CIRCULATION: Vertebral arteries patent to the vertebrobasilar junction. Posterior inferior cerebral arteries patent proximally. Basilar artery widely patent to its distal aspect. Superior cerebral arteries patent bilaterally. Left PCA  arises from the basilar artery and is well opacified to its distal aspect. Fetal type right PCA supplied via a a robust right posterior communicating artery. Right PCA also well opacified to its distal aspect. No aneurysm or vascular malformation. IMPRESSION: MRI HEAD IMPRESSION: 1. No acute intracranial infarct or other process identified. 2. Mild generalized cerebral atrophy, stable. MRA HEAD IMPRESSION: Normal intracranial MRA. Electronically Signed   By: Jeannine Boga M.D.   On: 06/06/2016 01:29   Mr Brain Wo Contrast  Result Date: 06/06/2016 CLINICAL DATA:  Initial evaluation for acute onset left-sided facial numbness with left arm weakness. EXAM: MRI HEAD WITHOUT CONTRAST MRA HEAD WITHOUT CONTRAST TECHNIQUE: Multiplanar, multiecho pulse sequences of the brain and surrounding structures were obtained without intravenous contrast. Angiographic images of the head were obtained using MRA technique without contrast. COMPARISON:  Prior CT from earlier  same day. FINDINGS: MRI HEAD FINDINGS Mild generalized cerebral atrophy is stable from previous studies. No significant cerebral white matter disease for patient age. No abnormal foci of restricted diffusion to suggest acute to subacute ischemia. Gray-white matter differentiation is well maintained. Major intracranial vascular flow voids are preserved. No evidence for acute or chronic intracranial hemorrhage. Two adjacent sub cm T2 hyperintense lucencies within the left paramedian pons, which may reflect small remote lacunar infarcts versus dilated perivascular spaces. No other evidence for remote infarction. No mass lesion, midline shift, or mass effect. No hydrocephalus. No extra-axial fluid collection. Major dural sinuses are grossly patent. Craniocervical junction normal. Visualized upper cervical spine unremarkable. Pituitary gland within normal limits. Globes and orbits unremarkable. Mild scattered mucosal thickening within the ethmoidal air cells.  Paranasal sinuses are otherwise clear. No mastoid effusion. Inner ear structures grossly normal. Bone marrow signal intensity within normal limits. No scalp soft tissue abnormality. MRA HEAD FINDINGS ANTERIOR CIRCULATION: Visualized distal cervical segments of the internal carotid arteries widely patent with antegrade flow. Petrous, cavernous, and supraclinoid segments widely patent. A1 segments, anterior communicating artery common anterior cerebral arteries well opacified. M1 segments widely patent without stenosis or occlusion. Distal MCA branches well opacified and symmetric. POSTERIOR CIRCULATION: Vertebral arteries patent to the vertebrobasilar junction. Posterior inferior cerebral arteries patent proximally. Basilar artery widely patent to its distal aspect. Superior cerebral arteries patent bilaterally. Left PCA arises from the basilar artery and is well opacified to its distal aspect. Fetal type right PCA supplied via a a robust right posterior communicating artery. Right PCA also well opacified to its distal aspect. No aneurysm or vascular malformation. IMPRESSION: MRI HEAD IMPRESSION: 1. No acute intracranial infarct or other process identified. 2. Mild generalized cerebral atrophy, stable. MRA HEAD IMPRESSION: Normal intracranial MRA. Electronically Signed   By: Jeannine Boga M.D.   On: 06/06/2016 01:29   Dg Chest Port 1 View  Result Date: 06/06/2016 CLINICAL DATA:  Chest pain tonight. EXAM: PORTABLE CHEST 1 VIEW COMPARISON:  Radiographs 01/10/2016 FINDINGS: Heart size is normal. Mediastinal contours are unchanged. There are 2 prostatic cardiac valves. Surgical clips project over the right hilum. Minimal atelectasis or scarring at the right lung base. No focal airspace disease, pleural effusion, pulmonary edema or pneumothorax. No acute osseous abnormality. IMPRESSION: No active disease. Electronically Signed   By: Jeb Levering M.D.   On: 06/06/2016 03:10   Ct Head Code Stroke W/o  Cm  Result Date: 06/05/2016 CLINICAL DATA:  Code stroke. Left-sided weakness with facial droop and slurred speech. EXAM: CT HEAD WITHOUT CONTRAST TECHNIQUE: Contiguous axial images were obtained from the base of the skull through the vertex without intravenous contrast. COMPARISON:  09/12/2015 FINDINGS: Negative for acute hemorrhage. No visible infarct or hyperdense vessel. Generalized cerebral and cerebellar atrophy that is stable from 2016. No acute osseous, orbital, or sinus finding. ASPECTS ([place] Stroke Program Early CT Score) - Ganglionic level infarction (caudate, lentiform nuclei, internal capsule, insula, M1-M3 cortex): 7 - Supraganglionic infarction (M4-M6 cortex): 3 Total score (0-10 with 10 being normal): 10 These results were called by telephone at the time of interpretation on 06/05/2016 at 8:26 pm to Dr. Nicole Kindred, who verbally acknowledged these results. IMPRESSION: 1. Negative for hemorrhage or visible infarct. 2. ASPECTS is 10. Electronically Signed   By: Monte Fantasia M.D.   On: 06/05/2016 20:28    Scheduled Meds: . aspirin EC  81 mg Oral Daily  . atorvastatin  20 mg Oral q1800  . warfarin  10 mg Oral  ONCE-1800  . Warfarin - Pharmacist Dosing Inpatient   Does not apply q1800   Continuous Infusions: . sodium chloride 100 mL/hr at 06/07/16 0530  . heparin 950 Units/hr (06/07/16 0528)     LOS: 1 day   Time spent: 25 minutes.  Vance Gather, MD [facility] Pager [phone]  If 7PM-7AM, please contact night-coverage [email or web address] Password [facility] 06/07/2016, 11:49 AM

## 2016-06-07 NOTE — Progress Notes (Signed)
Speech Language Pathology     Patient Details Name: Mary [name] MRN: [id number] DOB: Mar 10, 1961 Today's Date: 06/07/2016 Time:  -       Speech-language-cognitive abilities screened to be WNL's. Pt reports left facial numbness; facial/lingual ROM normal not affecting speech intelligibility. No need for formal assessment.    Orbie Pyo [place].Ed [employer] [phone]

## 2016-06-07 NOTE — Progress Notes (Signed)
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 Head CT, MRI- unremarkable. INR 2 so Heparin initiated.     PT Comments    Patient's strength and sensation improved today. Tolerated stair training with supervision for safety. Number of stairs limited due to IV Heparin. Performed higher level balance challenges with supervision for safety. No deviations in gait noted. Pt does not require further skilled therapy services as pt functioning close to baseline. Scored 22/24 on DGI-  Not deemed fall risk. Encouraged ambulation multiple times per day. Discharge from therapy.  Follow Up Recommendations  No PT follow up;Supervision - Intermittent     Equipment Recommendations  None recommended by PT    Recommendations for Other Services       Precautions / Restrictions Precautions Precautions: None Restrictions Weight Bearing Restrictions: No    Mobility  Bed Mobility Overal bed mobility: Independent                Transfers Overall transfer level: Independent Equipment used: None Transfers: Sit to/from Stand Sit to Stand: Independent         General transfer comment: No assist needed.   Ambulation/Gait Ambulation/Gait assistance: Modified independent (Device/Increase time) Ambulation Distance (Feet): 400 Feet Assistive device: None Gait Pattern/deviations: Step-through pattern;Decreased stride length Gait velocity: decreased   General Gait Details: Steady gait today and tolerated higher level balance challenges. See balance section.   Stairs Stairs: Yes Stairs assistance: Supervision Stair Management: Alternating pattern;Step to pattern;One rail  Right Number of Stairs: 7 General stair comments: Cues for safety and technique. Limited due to IV.  Wheelchair Mobility    Modified Rankin (Stroke Patients Only) Modified Rankin (Stroke Patients Only) Pre-Morbid Rankin Score: Slight disability Modified Rankin: Slight disability     Balance Overall balance assessment: Needs assistance Sitting-balance support: Feet supported;No upper extremity supported Sitting balance-Leahy Scale: Normal Sitting balance - Comments: Able to reach down to floor and grab socks and donnw ithout difficulty.    Standing balance support: During functional activity Standing balance-Leahy Scale: Good               High level balance activites: Backward walking;Direction changes;Turns;Sudden stops;Head turns High Level Balance Comments: Tolerated above without deviations in gait.     Cognition Arousal/Alertness: Awake/alert Behavior During Therapy: WFL for tasks assessed/performed Overall Cognitive Status: Within Functional Limits for tasks assessed                      Exercises      General Comments        Pertinent Vitals/Pain Pain Assessment: No/denies pain    Home Living                      Prior Function            PT Goals (current goals can now be found in the care plan section) Progress towards PT goals: Goals met/education completed, patient discharged from PT    Frequency  Min 4X/week    PT Plan Discharge plan needs to be updated    Co-evaluation             End of Session Equipment Utilized During Treatment: Gait belt Activity Tolerance:  Patient tolerated treatment well Patient left: in bed;with call bell/phone within reach     Time: 0931-0951 PT Time Calculation (min) (ACUTE ONLY): 20 min  Charges:  $Gait Training: 8-22 mins                    G Codes:      Jsean Taussig A Tywanna Seifer 06/07/2016, 10:04 AM Mylo Red, PT, DPT [phone]

## 2016-06-08 DIAGNOSIS — Z7901 Long term (current) use of anticoagulants: Secondary | ICD-10-CM

## 2016-06-08 DIAGNOSIS — R079 Chest pain, unspecified: Secondary | ICD-10-CM

## 2016-06-08 DIAGNOSIS — N179 Acute kidney failure, unspecified: Secondary | ICD-10-CM

## 2016-06-08 DIAGNOSIS — R531 Weakness: Secondary | ICD-10-CM

## 2016-06-08 DIAGNOSIS — G458 Other transient cerebral ischemic attacks and related syndromes: Principal | ICD-10-CM

## 2016-06-08 DIAGNOSIS — R791 Abnormal coagulation profile: Secondary | ICD-10-CM

## 2016-06-08 DIAGNOSIS — Z954 Presence of other heart-valve replacement: Secondary | ICD-10-CM

## 2016-06-08 LAB — CBC
HCT: 33.3 % — ABNORMAL LOW (ref 36.0–46.0)
HEMOGLOBIN: 11 g/dL — AB (ref 12.0–15.0)
MCH: 34 pg (ref 26.0–34.0)
MCHC: 33 g/dL (ref 30.0–36.0)
MCV: 102.8 fL — AB (ref 78.0–100.0)
Platelets: 316 10*3/uL (ref 150–400)
RBC: 3.24 MIL/uL — AB (ref 3.87–5.11)
RDW: 14.1 % (ref 11.5–15.5)
WBC: 6.8 10*3/uL (ref 4.0–10.5)

## 2016-06-08 LAB — PROTIME-INR
INR: 2.41
Prothrombin Time: 26.7 seconds — ABNORMAL HIGH (ref 11.4–15.2)

## 2016-06-08 LAB — HEPARIN LEVEL (UNFRACTIONATED): Heparin Unfractionated: 0.36 IU/mL (ref 0.30–0.70)

## 2016-06-08 MED ORDER — WARFARIN SODIUM 5 MG PO TABS: 5.0000 mg | ORAL_TABLET | ORAL | Status: DC

## 2016-06-08 MED ORDER — WARFARIN SODIUM 7.5 MG PO TABS: 7.5000 mg | ORAL_TABLET | ORAL | Status: DC

## 2016-06-08 MED ORDER — WARFARIN SODIUM 7.5 MG PO TABS
7.5000 mg | ORAL_TABLET | ORAL | Status: DC
Start: 2016-06-08 — End: 2016-06-09
  Administered 2016-06-08: 7.5 mg via ORAL
  Filled 2016-06-08: qty 1

## 2016-06-08 NOTE — Discharge Instructions (Addendum)

## 2016-06-08 NOTE — Progress Notes (Signed)
PROGRESS NOTE    Mary [name]  [id number] DOB: [birth year] DOA: 06/05/2016 PCP: Loralie Champagne, MD  Outpatient Specialists: Dr. Aundra Dubin, cardiology.    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 She reported adherence to warfarin dosing, though INR was subtherapeutic at 2.0. Code Stroke was called, head CT did not show acute CVA. Symptoms rapidly resolved and further evaluation with MRI was negative. Echo showed preserved EF and normally functioning MV prosthesis. Carotid U/S report pending. She remains on heparin gtt per pharmacy as INR remains subtherapeutic.   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 This is a recurrent issue: She had TIA Dec 2016 with subtherapeutic INR. Other RF's: former tobacco smoker, +FH in father.  - Symptoms resolved and MRI/MRA do not show acute infarct or critical stenosis, AVM or aneurysm.  - Echo without evident thromboembolic source - Carotid ultrasounds: Preliminary report: 1-39% ICA plaquing.  Significant soft plaque throughout.  Vertebral artery flow is antegrade.  Possible "hits" noted with pulse wave Doppler. Final result pending.  - Neurology consulted; signed off with recommendations. Need to follow up with Cecille Rubin NP at [facility]  in 2 months.  - Continue ASA 81mg  and coumadin after discharge, requires bridge as below.  - LDL 124: Started lipitor 20mg .  - Hb A1c 4.8  - PT/OT/Speech: signed off, no follow up  - Continue telemetry  Mechanical MVR with subtherapeutic INR: She reports adherence with dosing and INR checks at [facility] cardiology. In fact, last EMR reported INR PTA was 3.5 on March 18, 2016.  - Heparin infusion with coumadin until INR in therapeutic range (2.5 - 3.5) - Appreciate pharmacy assistance   Chest pain, atypical, resolved - Troponins negative, ECG unchanged, echo without wall motion abnormalities.  - No further work up indicated  AKI  - Resolved with hydration   DVT prophylaxis: IV heparin, coumadin, will discontinue IV heparin once INR 2.5-3.5 Code Status: Full Family Communication: Family not present during evaluation.  Disposition Plan: Discharge home hopefully tomorrow. Ongoing heparin infusion pending therapeutic INR.   Consultants:   Neurology, Dr. Erlinda Hong, signed off 9/11  Procedures:   Echo 06/06/2016: - Left ventricle: The cavity size was normal. Wall thickness was normal. Systolic function was normal. The estimated ejection fraction was in the range of 55% to 60%. Although no diagnostic regional wall motion abnormality was identified, this possibility cannot be completely excluded on the basis of this study. - Mitral valve: A mechanical prosthesis was present and functioning normally. Valve area by continuity equation (using LVOT flow): 1.42 cm^2. - Right atrium: The atrium was mildly dilated. - Tricuspid valve: Prior procedures included surgical repair. A ring annulus prosthesis was present and functioning normally.  Impressions: - No change in mitral or tricuspid gradients compared to earlier this year. Current study at average heart rate  17f 88 bpm.  Antimicrobials:  None   Subjective: Patient sitting in chair this morning. She has no complaints  this morning. She states that her left-sided weakness continues to improve. She states that she is able to ambulate in the hallways without issue. She denies any chest pain, shortness of breath, nausea, vomiting, diarrhea, abdominal pain, dysuria.   Objective: Vitals:   06/07/16 2115 06/08/16 0143 06/08/16 0439 06/08/16 1009  BP: (!) 138/97 133/89 130/72 (!) 123/102  Pulse: 94 89 90 99  Resp: 20 18 20 20   Temp: 97.7 F (36.5 C) 97.8 F (36.6 C) 97.5 F (36.4 C) 98.4 F (36.9 C)  TempSrc: Oral Oral Oral Oral  SpO2: 100% 100% 100% 99%  Weight:      Height:       No intake or output data in the 24 hours ending 06/08/16 1148 Filed Weights   06/05/16 2030 06/06/16 0100 06/06/16 0105  Weight: 63.6 kg (140 lb 3.4 oz) 63.6 kg (140 lb 3.4 oz) 65.4 kg (144 lb 2.9 oz)    Examination:  General exam: Appears calm and comfortable  Respiratory system: Clear to auscultation. Respiratory effort normal. Cardiovascular system: S1 & S2 heard, RRR. +mechanical valve click  Gastrointestinal system: Abdomen is nondistended, soft and nontender. No organomegaly or masses felt. Normal bowel sounds heard. Central nervous system: Alert and oriented. Speech normal, no gross CN deficits. Mild decreased strength of LLE compared to right  Extremities:  No deformities or edema  Skin: No rashes, lesions or ulcers Psychiatry: Judgement and insight appear normal. Mood & affect appropriate.   Data Reviewed: I have personally reviewed following labs and imaging studies  CBC:  Recent Labs Lab 06/05/16 2017 06/05/16 2022 06/06/16 1036 06/07/16 0447 06/08/16 0511  WBC 10.4  --  8.4 6.8 6.8  NEUTROABS 7.9*  --   --   --   --   HGB 13.9 14.6 13.7 12.2 11.0*  HCT 38.8 43.0 39.7 36.4 33.3*  MCV 97.5  --  100.0 102.2* 102.8*  PLT 375  --  407* 353 [id number]   Basic Metabolic Panel:  Recent Labs Lab 06/05/16 2017 06/05/16 2022 06/06/16 1036 06/07/16 0447  NA 125* 127* 133* 137  K 4.3 4.4 4.0 4.6  CL 97*  100* 103 109  CO2 16*  --  18* 23  GLUCOSE 91 87 97 86  BUN 9 10 11 8   CREATININE 1.44* 1.40* 1.09* 0.96  CALCIUM 9.0  --  9.1 8.8*   GFR: Estimated Creatinine Clearance: 60.1 mL/min (by C-G formula based on SCr of 0.96 mg/dL). Liver Function Tests:  Recent Labs Lab 06/05/16 2017  AST 29  ALT 13*  ALKPHOS 111  BILITOT 0.4  PROT 6.2*  ALBUMIN 3.2*   No results for input(s): LIPASE, AMYLASE in the last 168 hours. No results for input(s): AMMONIA in the last 168 hours. Coagulation Profile:  Recent Labs Lab 06/05/16 2017 06/06/16 1036 06/07/16 0448 06/08/16 0511  INR 2.00 1.97 1.87 2.41   Cardiac Enzymes:  Recent Labs Lab 06/06/16 0136 06/06/16 1036 06/06/16 1345  TROPONINI <0.03 <0.03 <0.03   BNP (last 3 results) No results for input(s): PROBNP in the last 8760 hours. HbA1C:  Recent Labs  06/06/16 0135  HGBA1C 4.8   CBG:  Recent Labs Lab 06/05/16 2013  GLUCAP 94   Lipid Profile:  Recent Labs  06/06/16 0135  CHOL 212*  HDL 66  LDLCALC 124*  TRIG 108  CHOLHDL 3.2   Thyroid Function Tests:  No results for input(s): TSH, T4TOTAL, FREET4, T3FREE, THYROIDAB in the last 72 hours. Anemia Panel: No results for input(s): VITAMINB12, FOLATE, FERRITIN, TIBC, IRON, RETICCTPCT in the last 72 hours. Sepsis Labs: No results for input(s): PROCALCITON, LATICACIDVEN in the last 168 hours.  No results found for this or any previous visit (from the past 240 hour(s)).       Radiology Studies: No results found.      Scheduled Meds: . aspirin EC  81 mg Oral Daily  . atorvastatin  20 mg Oral q1800  . [START ON 06/09/2016] warfarin  5 mg Oral Q T,Th,Sat-1800  . warfarin  7.5 mg Oral Once per day on Sun Mon Wed Fri  . Warfarin - Pharmacist Dosing Inpatient   Does not apply q1800   Continuous Infusions: . heparin 900 Units/hr (06/07/16 1156)     LOS: 2 days    Time spent: 40 minutes     Dessa Phi, DO [facility] Pager  [phone]  If 7PM-7AM, please contact night-coverage [email or web address] Password [facility] 06/08/2016, 11:48 AM

## 2016-06-08 NOTE — Progress Notes (Addendum)
[facility] for heparin / Coumadin Indication: MVR w/ low INR and acute CVA/TIA  Allergies  Allergen Reactions  . Oxycodone Itching    Patient Measurements: Height: 5\' 2"  (157.5 cm) Weight: 144 lb 2.9 oz (65.4 kg) IBW/kg (Calculated) : 50.1  Vital Signs: Temp: 97.5 F (36.4 C) (09/13 0439) Temp Source: Oral (09/13 0439) BP: 130/72 (09/13 0439) Pulse Rate: 90 (09/13 0439)  Labs:  Recent Labs  06/05/16 2017 06/05/16 2022 06/06/16 0136 06/06/16 1036 06/06/16 1345 06/07/16 0447 06/07/16 0448 06/08/16 0511  HGB 13.9 14.6  --  13.7  --  12.2  --  11.0*  HCT 38.8 43.0  --  39.7  --  36.4  --  33.3*  PLT 375  --   --  407*  --  353  --  316  APTT 40*  --   --   --   --   --   --   --   LABPROT 23.0*  --   --  22.7*  --   --  21.8* 26.7*  INR 2.00  --   --  1.97  --   --  1.87 2.41  HEPARINUNFRC  --   --   --  0.33  --   --  0.55 0.36  CREATININE 1.44* 1.40*  --  1.09*  --  0.96  --   --   TROPONINI  --   --  <0.03 <0.03 <0.03  --   --   --     Estimated Creatinine Clearance: 60.1 mL/min (by C-G formula based on SCr of 0.96 mg/dL).    Assessment: 55yo female to receive heparin bridge for low INR w/ MVR in setting of acute CVA/TIA. HL slightly high at 0.36 INR = 2.41   Goal of Therapy:  Heparin level 0.3-0.5 units/ml Monitor platelets by anticoagulation protocol: Yes  INR goal of 2.5 to 3.5   Plan:  Heparin at 900 units / hr while inpatient Start new Coumadin dose of 7.5 mg MWFSun, 5 mg TThSat  Daily labs If discharged, follow up INR Monday or Friday  Thank you Anette Guarneri, PharmD [phone]   06/08/2016,8:53 AM

## 2016-06-08 NOTE — Care Management Note (Signed)
Case Management Note  Patient Details  Name: Mary [name] MRN: [id number] Date of Birth: 05/18/1961  Subjective/Objective:                    Action/Plan: Pt without PCP or insurance. CM met with the patient and she states she has had Medicaid in the past and found out Friday that it had lapsed. She has been seeing her Cardiologist as her PCP. CM asked her about the [facility] to establish her a PCP and the use of the pharmacy to help her with her medications. Pt interested. CM obtained her an appointment at the [facility] who is doing overflow for [facility]. Information placed on the AVS. Pt also encouraged to use the pharmacy at [facility] at discharge.   Expected Discharge Date:  06/06/16               Expected Discharge Plan:  Home/Self Care  In-House Referral:     Discharge planning Services     Post Acute Care Choice:    Choice offered to:     DME Arranged:    DME Agency:     HH Arranged:    HH Agency:     Status of Service:  In process, will continue to follow  If discussed at Long Length of Stay Meetings, dates discussed:    Additional Comments:  Pollie Friar, RN 06/08/2016, 11:09 AM

## 2016-06-09 LAB — CBC
HCT: 33.9 % — ABNORMAL LOW (ref 36.0–46.0)
Hemoglobin: 11.2 g/dL — ABNORMAL LOW (ref 12.0–15.0)
MCH: 34.1 pg — ABNORMAL HIGH (ref 26.0–34.0)
MCHC: 33 g/dL (ref 30.0–36.0)
MCV: 103.4 fL — ABNORMAL HIGH (ref 78.0–100.0)
Platelets: 303 10*3/uL (ref 150–400)
RBC: 3.28 MIL/uL — ABNORMAL LOW (ref 3.87–5.11)
RDW: 13.9 % (ref 11.5–15.5)
WBC: 7.3 10*3/uL (ref 4.0–10.5)

## 2016-06-09 LAB — BASIC METABOLIC PANEL
Anion gap: 4 — ABNORMAL LOW (ref 5–15)
BUN: 5 mg/dL — AB (ref 6–20)
CHLORIDE: 113 mmol/L — AB (ref 101–111)
CO2: 23 mmol/L (ref 22–32)
Calcium: 8.6 mg/dL — ABNORMAL LOW (ref 8.9–10.3)
Creatinine, Ser: 0.76 mg/dL (ref 0.44–1.00)
GLUCOSE: 86 mg/dL (ref 65–99)
POTASSIUM: 3.9 mmol/L (ref 3.5–5.1)
SODIUM: 140 mmol/L (ref 135–145)

## 2016-06-09 LAB — PROTIME-INR
INR: 2.46
PROTHROMBIN TIME: 27.1 s — AB (ref 11.4–15.2)

## 2016-06-09 MED ORDER — ATORVASTATIN CALCIUM 20 MG PO TABS: 20.0000 mg | ORAL_TABLET | Freq: Every day | ORAL | 0 refills | Status: DC

## 2016-06-09 MED ORDER — WARFARIN SODIUM 5 MG PO TABS: 5.0000 mg | ORAL_TABLET | ORAL | 0 refills | Status: DC

## 2016-06-09 MED ORDER — WARFARIN SODIUM 7.5 MG PO TABS: ORAL_TABLET | ORAL | 0 refills | Status: DC

## 2016-06-09 NOTE — Progress Notes (Signed)
Pt discharge home with family. IV and telemetry removed. Discharge instructions given. Pt left unit via wheelchair with volunteer services at 1315. Wendee Copp

## 2016-06-09 NOTE — Discharge Summary (Signed)
Physician Discharge Summary  Mary [name] [id number] DOB: 28-Nov-1960 DOA: 06/05/2016  PCP: Mary Champagne, MD  Admit date: 06/05/2016 Discharge date: 06/09/2016  Admitted From: Home Disposition: Home  Recommendations for Outpatient Follow-up:  1. Follow up with PCP in 1 week  2. Follow up with Neurology Cecille Rubin NP at [facility] in 2 months.  3. Please obtain INR tomorrow 9/15 (Friday)   4. 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 She reported adherence to warfarin dosing, though INR was subtherapeutic at 2.0. Code Stroke was called, head CT did not show acute CVA. Symptoms rapidly resolved and further evaluation with MRI was negative. Echo showed preserved EF and normally functioning MV prosthesis. Carotid U/S report was unremarkable in the preliminary report. She remains on heparin gtt per pharmacy as INR remains subtherapeutic. Coumadin dose was adjusted per pharmacy. Patient's symptoms improved during admission.   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 This is a  recurrent issue: She had TIA Dec 2016 with subtherapeutic INR. Other RF's: former tobacco smoker, +FH in father.  - Symptoms resolved and MRI/MRA do not show acute infarct or critical stenosis, AVM or aneurysm.  - Echo without evident thromboembolic source - Carotid ultrasounds: Preliminary report: 1-39% ICA plaquing. Significant soft plaque throughout. Vertebral artery flow is antegrade. Possible "hits" noted with pulse wave Doppler. Final result pending.  - Neurology consulted; signed off with recommendations. Need to follow up with Cecille Rubin NP at [facility] in 2 months.  - Continue ASA 81mg  and coumadin after discharge. New dose of coumadin: 7.5 mg MWFSun, 5 mg TThSat.  - LDL 124: Started lipitor 20mg .  - Hb A1c 4.8  - PT/OT/Speech: signed off, no follow up  - Continue telemetry  Mechanical MVR with subtherapeutic INR: She reports adherence with dosing and INR checks at [facility] cardiology. In fact, last EMR reported INR PTA was 3.5 on March 18, 2016.  - Heparin infusion with coumadin until INR in therapeutic range (2.5 - 3.5) - Appreciate pharmacy assistance  - New dose of coumadin: 7.5 mg MWFSun, 5 mg TThSat.   Chest pain, atypical, resolved - Troponins negative,ECG unchanged, echo without wall motion abnormalities.  - No further work up indicated  AKI  - Resolved with hydration   Discharge Instructions  Discharge Instructions    Diet - low sodium heart healthy    Complete by:  As directed    Increase activity slowly    Complete by:  As directed        Medication List    TAKE these medications   acetaminophen 500 MG tablet Commonly known as:  TYLENOL Take 500 mg by mouth every 6 (six) hours as  needed for mild pain or moderate pain.   aspirin EC 81 MG tablet Take 1 tablet (81 mg total) by mouth daily.   atorvastatin 20 MG tablet Commonly known as:  LIPITOR Take 1 tablet (20 mg total) by mouth daily at 6 PM.   warfarin 7.5 MG tablet Commonly known as:   COUMADIN 7.5 mg Monday, Wed, Fri, Sun What changed:  medication strength  how much to take  how to take this  when to take this  additional instructions   warfarin 5 MG tablet Commonly known as:  COUMADIN Take 1 tablet (5 mg total) by mouth every Tuesday, Thursday, and Saturday at 6 PM. What changed:  You were already taking a medication with the same name, and this prescription was added. Make sure you understand how and when to take each.   WITCH HAZEL EX Apply 1 application topically at bedtime as needed (knee pain).      Follow-up Information    Dennie Bible, NP. Schedule an appointment as soon as possible for a visit in 2 month(s).   Specialty:  Family Medicine Why:  TIA follow up Contact information: [address] [place] [address] [place] Follow up on 07/26/2016.   WhyKS:[id number]. your appointment time is 11:00. Please arrive 15 min early and bring a picture ID and your current medications.  Contact information: [place] [phone]       Mary Champagne, MD Follow up in 1 day(s).   Specialty:  Cardiology Contact information: [id number] [address] [place] [address] [phone]          Allergies  Allergen Reactions  . Oxycodone Itching    Consultations:  Neurology Dr. Nicole Kindred    Procedures/Studies: Mr Jodene Nam Head Wo Contrast  Result Date: 06/06/2016 CLINICAL DATA:  Initial evaluation for acute onset left-sided facial numbness with left arm weakness. EXAM: MRI HEAD WITHOUT CONTRAST MRA HEAD WITHOUT CONTRAST TECHNIQUE: Multiplanar, multiecho pulse sequences of the brain and surrounding structures were obtained without intravenous contrast. Angiographic images of the head were obtained using MRA technique without contrast. COMPARISON:  Prior CT from earlier same day. FINDINGS: MRI HEAD FINDINGS Mild generalized cerebral atrophy is  stable from previous studies. No significant cerebral white matter disease for patient age. No abnormal foci of restricted diffusion to suggest acute to subacute ischemia. Gray-white matter differentiation is well maintained. Major intracranial vascular flow voids are preserved. No evidence for acute or chronic intracranial hemorrhage. Two adjacent sub cm T2 hyperintense lucencies within the left paramedian pons, which may reflect small remote lacunar infarcts versus dilated perivascular spaces. No other evidence for remote infarction. No mass lesion, midline shift, or mass effect. No hydrocephalus. No extra-axial fluid collection. Major dural sinuses are grossly patent. Craniocervical junction normal. Visualized upper cervical spine unremarkable. Pituitary gland within normal limits. Globes and orbits unremarkable. Mild scattered mucosal thickening within the ethmoidal air cells. Paranasal sinuses are otherwise clear. No mastoid effusion. Inner ear structures grossly normal. Bone marrow signal intensity within normal limits. No scalp soft tissue abnormality. MRA HEAD FINDINGS ANTERIOR CIRCULATION: Visualized distal cervical segments of the internal carotid arteries widely patent with antegrade flow. Petrous, cavernous, and supraclinoid segments widely patent. A1 segments, anterior communicating artery common anterior cerebral arteries well opacified. M1 segments widely patent without stenosis or occlusion. Distal MCA branches well opacified and symmetric. POSTERIOR CIRCULATION: Vertebral arteries patent to the  vertebrobasilar junction. Posterior inferior cerebral arteries patent proximally. Basilar artery widely patent to its distal aspect. Superior cerebral arteries patent bilaterally. Left PCA arises from the basilar artery and is well opacified to its distal aspect. Fetal type right PCA supplied via a a robust right posterior communicating artery. Right PCA also well opacified to its distal aspect. No aneurysm  or vascular malformation. IMPRESSION: MRI HEAD IMPRESSION: 1. No acute intracranial infarct or other process identified. 2. Mild generalized cerebral atrophy, stable. MRA HEAD IMPRESSION: Normal intracranial MRA. Electronically Signed   By: Jeannine Boga M.D.   On: 06/06/2016 01:29   Mr Brain Wo Contrast  Result Date: 06/06/2016 CLINICAL DATA:  Initial evaluation for acute onset left-sided facial numbness with left arm weakness. EXAM: MRI HEAD WITHOUT CONTRAST MRA HEAD WITHOUT CONTRAST TECHNIQUE: Multiplanar, multiecho pulse sequences of the brain and surrounding structures were obtained without intravenous contrast. Angiographic images of the head were obtained using MRA technique without contrast. COMPARISON:  Prior CT from earlier same day. FINDINGS: MRI HEAD FINDINGS Mild generalized cerebral atrophy is stable from previous studies. No significant cerebral white matter disease for patient age. No abnormal foci of restricted diffusion to suggest acute to subacute ischemia. Gray-white matter differentiation is well maintained. Major intracranial vascular flow voids are preserved. No evidence for acute or chronic intracranial hemorrhage. Two adjacent sub cm T2 hyperintense lucencies within the left paramedian pons, which may reflect small remote lacunar infarcts versus dilated perivascular spaces. No other evidence for remote infarction. No mass lesion, midline shift, or mass effect. No hydrocephalus. No extra-axial fluid collection. Major dural sinuses are grossly patent. Craniocervical junction normal. Visualized upper cervical spine unremarkable. Pituitary gland within normal limits. Globes and orbits unremarkable. Mild scattered mucosal thickening within the ethmoidal air cells. Paranasal sinuses are otherwise clear. No mastoid effusion. Inner ear structures grossly normal. Bone marrow signal intensity within normal limits. No scalp soft tissue abnormality. MRA HEAD FINDINGS ANTERIOR CIRCULATION:  Visualized distal cervical segments of the internal carotid arteries widely patent with antegrade flow. Petrous, cavernous, and supraclinoid segments widely patent. A1 segments, anterior communicating artery common anterior cerebral arteries well opacified. M1 segments widely patent without stenosis or occlusion. Distal MCA branches well opacified and symmetric. POSTERIOR CIRCULATION: Vertebral arteries patent to the vertebrobasilar junction. Posterior inferior cerebral arteries patent proximally. Basilar artery widely patent to its distal aspect. Superior cerebral arteries patent bilaterally. Left PCA arises from the basilar artery and is well opacified to its distal aspect. Fetal type right PCA supplied via a a robust right posterior communicating artery. Right PCA also well opacified to its distal aspect. No aneurysm or vascular malformation. IMPRESSION: MRI HEAD IMPRESSION: 1. No acute intracranial infarct or other process identified. 2. Mild generalized cerebral atrophy, stable. MRA HEAD IMPRESSION: Normal intracranial MRA. Electronically Signed   By: Jeannine Boga M.D.   On: 06/06/2016 01:29   Dg Chest Port 1 View  Result Date: 06/06/2016 CLINICAL DATA:  Chest pain tonight. EXAM: PORTABLE CHEST 1 VIEW COMPARISON:  Radiographs 01/10/2016 FINDINGS: Heart size is normal. Mediastinal contours are unchanged. There are 2 prostatic cardiac valves. Surgical clips project over the right hilum. Minimal atelectasis or scarring at the right lung base. No focal airspace disease, pleural effusion, pulmonary edema or pneumothorax. No acute osseous abnormality. IMPRESSION: No active disease. Electronically Signed   By: Jeb Levering M.D.   On: 06/06/2016 03:10   Ct Head Code Stroke W/o Cm  Result Date: 06/05/2016 CLINICAL DATA:  Code stroke. Left-sided weakness with facial  droop and slurred speech. EXAM: CT HEAD WITHOUT CONTRAST TECHNIQUE: Contiguous axial images were obtained from the base of the skull  through the vertex without intravenous contrast. COMPARISON:  09/12/2015 FINDINGS: Negative for acute hemorrhage. No visible infarct or hyperdense vessel. Generalized cerebral and cerebellar atrophy that is stable from 2016. No acute osseous, orbital, or sinus finding. ASPECTS ([place] Stroke Program Early CT Score) - Ganglionic level infarction (caudate, lentiform nuclei, internal capsule, insula, M1-M3 cortex): 7 - Supraganglionic infarction (M4-M6 cortex): 3 Total score (0-10 with 10 being normal): 10 These results were called by telephone at the time of interpretation on 06/05/2016 at 8:26 pm to Dr. Nicole Kindred, who verbally acknowledged these results. IMPRESSION: 1. Negative for hemorrhage or visible infarct. 2. ASPECTS is 10. Electronically Signed   By: Monte Fantasia M.D.   On: 06/05/2016 20:28   Echo Study Conclusions - Left ventricle: The cavity size was normal. Wall thickness was   normal. Systolic function was normal. The estimated ejection   fraction was in the range of 55% to 60%. Although no diagnostic   regional wall motion abnormality was identified, this possibility   cannot be completely excluded on the basis of this study. - Mitral valve: A mechanical prosthesis was present and functioning   normally. Valve area by continuity equation (using LVOT flow):   1.42 cm^2. - Right atrium: The atrium was mildly dilated. - Tricuspid valve: Prior procedures included surgical repair. A   ring annulus prosthesis was present and functioning normally.  Impressions: - No change in mitral or tricuspid gradients compared to earlier   this year. Current study at average heart rate 77f 88 bpm.  Carotid US  Preliminary report:  1-39% ICA plaquing.  Significant soft plaque throughout.  Vertebral artery flow is antegrade.  Possible "hits" noted with pulse wave Doppler.    Subjective: Patient doing well this morning on my examination. She has been ambulating in the halls and eating well. She denies  any new symptoms or worsening weakness of her extremities. She denies any chest pain, shortness of breath, nausea, vomiting, diarrhea.   Discharge Exam: Vitals:   06/09/16 0448 06/09/16 0923  BP: 137/80 127/77  Pulse: 81 90  Resp: 20 20  Temp: 98.1 F (36.7 C) 98.7 F (37.1 C)   Vitals:   06/08/16 2052 06/09/16 0029 06/09/16 0448 06/09/16 0923  BP: 137/83 130/79 137/80 127/77  Pulse: 89 88 81 90  Resp: 18 20 20 20   Temp: 98.1 F (36.7 C) 98 F (36.7 C) 98.1 F (36.7 C) 98.7 F (37.1 C)  TempSrc: Oral Oral Oral Oral  SpO2: 100% 100% 99% 100%  Weight:      Height:        General exam: Appears calm and comfortable  Respiratory system: Clear to auscultation. Respiratory effort normal. Cardiovascular system: S1 & S2 heard, RRR. +mechanical valve click  Gastrointestinal system: Abdomen is nondistended, soft and nontender. No organomegaly or masses felt. Normal bowel sounds heard. Central nervous system: Alert and oriented. Speech normal, no gross CN deficits. Mild decreased strength of LLE compared to right  Extremities:  No deformities or edema  Skin: No rashes, lesions or ulcers Psychiatry: Judgement and insight appear normal. Mood & affect appropriate.     The results of significant diagnostics from this hospitalization (including imaging, microbiology, ancillary and laboratory) are listed below for reference.     Microbiology: No results found for this or any previous visit (from the past 240 hour(s)).   Labs: BNP (last  3 results) No results for input(s): BNP in the last 8760 hours. Basic Metabolic Panel:  Recent Labs Lab 06/05/16 2017 06/05/16 2022 06/06/16 1036 06/07/16 0447 06/09/16 0308  NA 125* 127* 133* 137 140  K 4.3 4.4 4.0 4.6 3.9  CL 97* 100* 103 109 113*  CO2 16*  --  18* 23 23  GLUCOSE 91 87 97 86 86  BUN 9 10 11 8  5*  CREATININE 1.44* 1.40* 1.09* 0.96 0.76  CALCIUM 9.0  --  9.1 8.8* 8.6*   Liver Function Tests:  Recent Labs Lab  06/05/16 2017  AST 29  ALT 13*  ALKPHOS 111  BILITOT 0.4  PROT 6.2*  ALBUMIN 3.2*   No results for input(s): LIPASE, AMYLASE in the last 168 hours. No results for input(s): AMMONIA in the last 168 hours. CBC:  Recent Labs Lab 06/05/16 2017 06/05/16 2022 06/06/16 1036 06/07/16 0447 06/08/16 0511 06/09/16 0308  WBC 10.4  --  8.4 6.8 6.8 7.3  NEUTROABS 7.9*  --   --   --   --   --   HGB 13.9 14.6 13.7 12.2 11.0* 11.2*  HCT 38.8 43.0 39.7 36.4 33.3* 33.9*  MCV 97.5  --  100.0 102.2* 102.8* 103.4*  PLT 375  --  407* 353 316 303   Cardiac Enzymes:  Recent Labs Lab 06/06/16 0136 06/06/16 1036 06/06/16 1345  TROPONINI <0.03 <0.03 <0.03   BNP: Invalid input(s): POCBNP CBG:  Recent Labs Lab 06/05/16 2013  GLUCAP 94   D-Dimer No results for input(s): DDIMER in the last 72 hours. Hgb A1c No results for input(s): HGBA1C in the last 72 hours. Lipid Profile No results for input(s): CHOL, HDL, LDLCALC, TRIG, CHOLHDL, LDLDIRECT in the last 72 hours. Thyroid function studies No results for input(s): TSH, T4TOTAL, T3FREE, THYROIDAB in the last 72 hours.  Invalid input(s): FREET3 Anemia work up No results for input(s): VITAMINB12, FOLATE, FERRITIN, TIBC, IRON, RETICCTPCT in the last 72 hours. Urinalysis    Component Value Date/Time   COLORURINE YELLOW 06/05/2016 2108   APPEARANCEUR CLOUDY (A) 06/05/2016 2108   LABSPEC 1.012 06/05/2016 2108   PHURINE 5.5 06/05/2016 2108   GLUCOSEU NEGATIVE 06/05/2016 2108   HGBUR LARGE (A) 06/05/2016 2108   BILIRUBINUR NEGATIVE 06/05/2016 2108   [place] NEGATIVE 06/05/2016 2108   PROTEINUR 30 (A) 06/05/2016 2108   UROBILINOGEN 0.2 06/04/2015 1533   NITRITE NEGATIVE 06/05/2016 2108   LEUKOCYTESUR TRACE (A) 06/05/2016 2108   Sepsis Labs Invalid input(s): PROCALCITONIN,  WBC,  LACTICIDVEN Microbiology No results found for this or any previous visit (from the past 240 hour(s)).   Time coordinating discharge: Over 30  minutes  SIGNED: Dessa Phi, DO [facility] Pager [phone]  If 7PM-7AM, please contact night-coverage [email or web address] Password [facility] 06/09/2016, 10:25 AM

## 2016-06-10 LAB — VAS US CAROTID
LCCADDIAS: -27 cm/s
LEFT ECA DIAS: -12 cm/s
LEFT VERTEBRAL DIAS: -23 cm/s
LICAPDIAS: -22 cm/s
LICAPSYS: -55 cm/s
Left CCA dist sys: -83 cm/s
Left CCA prox dias: 31 cm/s
Left CCA prox sys: 95 cm/s
Left ICA dist dias: -38 cm/s
Left ICA dist sys: -99 cm/s
RIGHT ECA DIAS: -16 cm/s
RIGHT VERTEBRAL DIAS: 18 cm/s
Right CCA prox dias: 30 cm/s
Right CCA prox sys: 83 cm/s
Right cca dist sys: -99 cm/s

## 2016-06-16 NOTE — Progress Notes (Signed)
Cardiology Office Note:    Date:  06/17/2016   ID:  MATEYA FELKER, DOB Oct 19, 1960, MRN [id number]  PCP:  Marca Ancona, MD  Cardiologist:  Dr. Marca Ancona   Electrophysiologist:  Dr. Lewayne Bunting   Referring MD: Laurey Morale, MD   Chief Complaint  Patient presents with  . Follow-up    Palpitations, Tachycardia  . Hospitalization Follow-up    s/p TIA 06/05/16-06/09/16)    History of Present Illness:    Mary [name] is a 55 y.o. 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 Post op course c/b CHB that recovered without needing a PPM. She was kept off all AVN blocking agents and wore a Holter monitor in 7/15 which demonstrated asymptomatic CHB while sleeping. She saw Dr. Lewayne Bunting and pacemaker was not recommended as CHB only occurred during sleep.   Last seen by Dr. Shirlee Latch 6/17. She was noted to have palpitations and sinus tachycardia. She was placed on Toprol-XL 25 mg daily to help with palpitations. Event monitor was arranged. This demonstrated one episode of high-grade heart block at night, otherwise mild sinus tachycardia. Her beta blocker was stopped. Dr. Shirlee Latch considered referral to EP. As for pauses occurred while on beta blocker therapy, it was felt that referral to be made if she remained symptomatic.  She was admitted 9/10-9/14 with a TIA (left-sided numbness and weakness). This were her 2nd admission in the last 12 mos with a TIA and sub-therapeutic INR (last was in 12/16).  She also reported chest discomfort. Echocardiogram demonstrated normal LV function, normally functioning mechanical mitral valve prosthesis and normally functioning tricuspid valve repair. Head CT and MRI were both normal. INR was 2 and it was felt that her TIA was secondary to subtherapeutic INR. Coumadin dose was adjusted.  Returns for FU.  She is here alone.  She has an iced tea from [employer] that she is drinking.  She  used to drink Coca Cola every day but stopped. She does not use OTC decongestants.  She denies chest pain.  She notes worsening palpitations with any type of activity with assoc dyspnea.  She denies significant orthostatic symptoms.   She was unable to advance PT in the hospital after her recent TIA b/c of symptomatic palpitations. She tells me that she never took Toprol-XL b/c it was too expensive.  She denies syncope.  She denies any bleeding issues.  She has decided to look into disability.   Prior CV studies that were reviewed today include:    Event Monitor 02/24/16 1 episode of high grade heart block at night.  Otherwise, mild sinus tachycardia.    Carotid US 9/17 Bilateral: soft plaque throughout CCA, ICA and ECA. 1-39% ICA plaquing. Vertebral artery flow is antegrade.  Echo 06/06/16 EF 55-60%, mechanical MVR functioning normally, mild RAE, TV repair functioning normally  Echo 11/03/15 Posterior wall mild LVH, EF 55-60%, normal wall motion, mechanical MVR okay, normal RVSF  Echo 01/01/15 Mild focal basal septal hypertrophy, EF 60-65%, normal wall motion, S/P MVR (St Jude mechanical valve) functioning well, mild MR (small mobile echodensity-likely redundant chord versus suture), mild LAE, TV mean gradient 4 mmHg.   Echo 7/15 Mild LVH, EF 60-65%, normal wall motion, grade 1 diastolic dysfunction, S/P MVR (St Jude mechanical valve) and appears functioning well, mild MR, no perivalvular leak, mild LAE, mildly reduced RVSF.   Carotid US 8/14 Bilateral - 1% to 39%  ICA stenosis   Cardiac cath 6/14 Left main: No obstructive disease.  Left Anterior Descending Artery: No obstructive disease noted.  Circumflex Artery: No obstructive disease.  Right Coronary Artery: no obstructive disease.  Left Ventricular Angiogram: LVEF 65-70%. 3+ MR Distal Aortogram: No aneurysm. No stenosis.    Past Medical History:  Diagnosis Date  . Acute diastolic heart failure (HCC) 03/15/2013  . Anemia     . Asthma    "as a baby"   . Carotid stenosis    Carotid US 2/17: bilateral ICA 1-39% >> FU prn  . Complete heart block (HCC) 05/28/2013   Post-op  . Epigastric hernia 200's  . GERD (gastroesophageal reflux disease)   . History of blood transfusion    "14 w/1st pregnancy; 2 w/last C-section" (03/15/2013)  . Hx of echocardiogram    a. Echo 4/16:  Mild focal basal septal hypertrophy, EF 60-65%, no RWMA, Mechanical MVR ok with mild central regurgitation and no perivalvular leak, small mobile density attached to valvular ring (1.5x1 cm) - post surgical changes vs SBE, mild LAE;   b. Echo 2/17:  Mild post wall LVH, EF 55-60%, no RWMA, mechanical MVR ok (mean 5 mmHg), normal RVSF  . Hypertension    no pcp   will [facility] to [facility]  . 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   33mm Sorin Carbomedics mechanical prosthesis via right mini thoracotomy approach  . 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 Severe mitral regurgitation 04/22/2013  . SVT (supraventricular tachycardia) (HCC) 03/16/2013  . Tricuspid regurgitation     Past Surgical History:  Procedure Laterality Date  . APPENDECTOMY  [id number]  . CARDIAC CATHETERIZATION    . CESAREAN SECTION  1983; 1999  . CESAREAN SECTION WITH BILATERAL TUBAL LIGATION  1999  . FEMORAL HERNIA REPAIR Right 05/23/2013   Procedure: HERNIA REPAIR FEMORAL;  Surgeon: Purcell Nails, MD;  Location: [facility] OR;  Service: Open Heart Surgery;  Laterality: Right;  . INTRAOPERATIVE TRANSESOPHAGEAL ECHOCARDIOGRAM N/A 05/23/2013   Procedure: INTRAOPERATIVE TRANSESOPHAGEAL ECHOCARDIOGRAM;  Surgeon: Purcell Nails, MD;  Location: [facility] OR;  Service: Open Heart Surgery;  Laterality: N/A;  . LAPAROSCOPIC CHOLECYSTECTOMY  2003  . LEFT AND RIGHT  HEART CATHETERIZATION WITH CORONARY ANGIOGRAM N/A 03/22/2013   Procedure: LEFT AND RIGHT HEART CATHETERIZATION WITH CORONARY ANGIOGRAM;  Surgeon: Kathleene Hazel, MD;  Location: [facility] CATH LAB;  Service: Cardiovascular;  Laterality: N/A;  . MINIMALLY INVASIVE TRICUSPID VALVE REPAIR Right 05/23/2013   Procedure: MINIMALLY INVASIVE TRICUSPID VALVE REPAIR;  Surgeon: Purcell Nails, MD;  Location: [facility] OR;  Service: Open Heart Surgery;  Laterality: Right;  . MITRAL VALVE REPLACEMENT N/A 05/23/2013   Procedure: MITRAL VALVE (MV) REPLACEMENT;  Surgeon: Purcell Nails, MD;  Location: [facility] OR;  Service: Open Heart Surgery;  Laterality: N/A;  . MULTIPLE EXTRACTIONS WITH ALVEOLOPLASTY N/A 04/04/2013   Procedure: Extraction of tooth #'s 1,8,9,13,14,15,23,24,25,26 with alveoloplasty and gross debridement of remaining teeth;  Surgeon: Charlynne Pander, DDS;  Location: [facility] OR;  Service: Oral Surgery;  Laterality: N/A;  . TEE WITHOUT CARDIOVERSION N/A 03/18/2013   Procedure: TRANSESOPHAGEAL ECHOCARDIOGRAM (TEE);  Surgeon: Laurey Morale, MD;  Location: [facility] ENDOSCOPY;  Service: Cardiovascular;  Laterality: N/A;  . TEE WITHOUT CARDIOVERSION N/A 06/17/2013   Procedure: TRANSESOPHAGEAL ECHOCARDIOGRAM (TEE);  Surgeon: Madolyn Frieze  Jens Som, MD;  Location: [facility] ENDOSCOPY;  Service: Cardiovascular;  Laterality: N/A;    Current Medications: Current Meds  Medication Sig  . acetaminophen (TYLENOL) 500 MG tablet Take 500 mg by mouth every 6 (six) hours as needed for mild pain or moderate pain.  [name] aspirin EC 81 MG tablet Take 1 tablet (81 mg total) by mouth daily.  [name] warfarin (COUMADIN) 5 MG tablet Take 1 tablet (5 mg total) by mouth every Tuesday, Thursday, and Saturday at 6 PM.  . warfarin (COUMADIN) 7.5 MG tablet 7.5 mg Monday, Wed, Fri, Sun  . [facility] HAZEL EX Apply 1 application topically at bedtime as needed (knee pain).        Allergies:   Oxycodone   Social History   Social History  . Marital status: Single    Spouse  name: N/A  . Number of children: N/A  . Years of education: N/A   Social History Main Topics  . Smoking status: Former Smoker    Packs/day: 0.50    Years: 36.00    Types: Cigarettes    Quit date: 03/15/2013  . Smokeless tobacco: Never Used  . Alcohol use 3.6 oz/week    6 Glasses of wine per week     Comment: 03/15/2013 "bottle of wine/wk"  . Drug use: No  . Sexual activity: No   Other Topics Concern  . None   Social History Narrative  . None     Family History:  The patient's family history includes Cancer in her mother; Sarcoidosis in her sister; Stroke in her father.   ROS:   Please see the history of present illness.    ROS All other systems reviewed and are negative.   EKGs/Labs/Other Test Reviewed:    EKG:  EKG is not ordered today.  The ekg ordered today demonstrates n/a  Recent Labs: 01/10/2016: TSH 1.725 01/11/2016: Magnesium 1.6 06/05/2016: ALT 13 06/09/2016: BUN 5; Creatinine, Ser 0.76; Hemoglobin 11.2; Platelets 303; Potassium 3.9; Sodium 140   Recent Lipid Panel    Component Value Date/Time   CHOL 212 (H) 06/06/2016 0135   TRIG 108 06/06/2016 0135   HDL 66 06/06/2016 0135   CHOLHDL 3.2 06/06/2016 0135   VLDL 22 06/06/2016 0135   LDLCALC 124 (H) 06/06/2016 0135     Physical Exam:    VS:  BP 140/82   Pulse (!) 110   Ht 5\' 2"  (1.575 m)   Wt 135 lb 6.4 oz (61.4 kg)   LMP 01/28/2013   BMI 24.76 kg/m     Wt Readings from Last 3 Encounters:  06/17/16 135 lb 6.4 oz (61.4 kg)  06/06/16 144 lb 2.9 oz (65.4 kg)  03/18/16 139 lb (63 kg)     Physical Exam  Constitutional: She is oriented to person, place, and time. She appears well-developed and well-nourished. No distress.  HENT:  Head: Normocephalic and atraumatic.  Eyes: No scleral icterus.  Neck: No JVD present.  Cardiovascular: Normal rate and regular rhythm.   No murmur heard. Mechanical S1, normal S2  Pulmonary/Chest: Effort normal. She has no wheezes. She has no rales.  Abdominal: Soft.  There is no tenderness.  Musculoskeletal: She exhibits no edema.  Neurological: She is alert and oriented to person, place, and time.  Skin: Skin is warm and dry.  Psychiatric: She has a normal mood and affect.    ASSESSMENT:    1. Palpitations   2. Complete heart block (HCC)   3. S/P minimally-invasive mitral valve replacement with mechanical  valve and tricuspid valve repair   4. History of TIA (transient ischemic attack)   5. HLD (hyperlipidemia)    PLAN:    In order of problems listed above:  1. Symptomatic Sinus Tachycardia -  She has had a history of SVT and a history of complete heart block. AVN blocking agents were DC'd.  Holter in 2015 demonstrated complete heart block during hours of sleep and she did not need pacemaker.  She has continued to have symptomatic tachycardia. It had been a long time since heart block had been noted and she was to be tried on low-dose beta blocker therapy to see if this would help her symptoms. However, she never took this medication. An event monitor done in 5/17 demonstrated 1 episode of complete heart block at night and this was while she was NOT on AVN blocking agents.  She continues to be symptomatic with her palpitations and they are lifestyle limiting.  She is now interested in seeking disability.  At this point, I am not sure what else we can do to control her symptomatic tachycardia.  I have asked her to limit/refrain from caffeine/stimulants.  I will refer her back to Dr. Ladona Ridgel for EP evaluation.   2. Complete heart block -  As outlined above.  One episode noted on recent monitor during sleep.  She likely does not need pacer implantation.   3. S/P minimally-invasive mitral valve replacement with mechanical valve and tricuspid valve repair - Recent echo with normally functioning mechanical MVR and TV repair.  Continue coumadin.  Recent TIA in setting of low INR.  INR goal 2.5-3.5.  Continue ASA.  Arrange FU with Coumadin Clinic today.   4.  History of TIA -  As noted, recent TIA related to subtherapeutic INR.  Will continue Coumadin and ASA.  FU in Coumadin Clinic. FU with Neuro as planned.   5. HL - With hx of TIA she should be on statin.  She could not afford Lipitor.  Start Pravastatin 40 mg QHS.  Lipids and LFTs in 8 weeks.   Medication Adjustments/Labs and Tests Ordered: Current medicines are reviewed at length with the patient today.  Concerns regarding medicines are outlined above.  Medication changes, Labs and Tests ordered today are outlined in the Patient Instructions noted below. Patient Instructions  Medication Instructions:  1. STOP LIPITOR 2. START PRAVASTATIN 40 MG DAILY; RX HAS BEEN SENT IN  Labwork: 08/12/16 FASTING LIPID AND LIVER PANEL  Testing/Procedures: NONE  Follow-Up: 1. YOU NEED A FOLLOW UP WITH DR. Ladona Ridgel; DX TACHYCARDIA AND CHB; YOUR LAST SEEN IN 2015 WITH DR. Ladona Ridgel 2. YOU WILL NEED A FOLLOW UP WITH Jamilee Lafosse, PAC IN 3 MONTHS SAME DAY DR, [facility] IS IN THE OFFICE IF POSSIBLE   Any Other Special Instructions Will Be Listed Below (If Applicable). YOU HAVE BEEN INSTRUCTED TO DECREASE CAFFEINE INTAKE YOU WILL HAVE YOUR INR CHECKED TODAY  If you need a refill on your cardiac medications before your next appointment, please call your pharmacy.  Signed, Tereso Newcomer, PA-C  06/17/2016 8:49 AM    [facility] [address] [place], [place], [place]  [address] Phone: [phone]; Fax: [phone]

## 2016-06-17 ENCOUNTER — Ambulatory Visit (INDEPENDENT_AMBULATORY_CARE_PROVIDER_SITE_OTHER): Payer: Self-pay | Admitting: *Deleted

## 2016-06-17 ENCOUNTER — Encounter (INDEPENDENT_AMBULATORY_CARE_PROVIDER_SITE_OTHER): Payer: Self-pay

## 2016-06-17 ENCOUNTER — Ambulatory Visit (INDEPENDENT_AMBULATORY_CARE_PROVIDER_SITE_OTHER): Payer: Self-pay | Admitting: Physician Assistant

## 2016-06-17 ENCOUNTER — Encounter: Payer: Self-pay | Admitting: Physician Assistant

## 2016-06-17 VITALS — BP 140/82 | HR 110 | Ht 62.0 in | Wt 135.4 lb

## 2016-06-17 DIAGNOSIS — I442 Atrioventricular block, complete: Secondary | ICD-10-CM

## 2016-06-17 DIAGNOSIS — I059 Rheumatic mitral valve disease, unspecified: Secondary | ICD-10-CM

## 2016-06-17 DIAGNOSIS — Z954 Presence of other heart-valve replacement: Secondary | ICD-10-CM

## 2016-06-17 DIAGNOSIS — Z8673 Personal history of transient ischemic attack (TIA), and cerebral infarction without residual deficits: Secondary | ICD-10-CM

## 2016-06-17 DIAGNOSIS — R002 Palpitations: Secondary | ICD-10-CM

## 2016-06-17 DIAGNOSIS — Z952 Presence of prosthetic heart valve: Secondary | ICD-10-CM

## 2016-06-17 DIAGNOSIS — Z7901 Long term (current) use of anticoagulants: Secondary | ICD-10-CM

## 2016-06-17 DIAGNOSIS — E785 Hyperlipidemia, unspecified: Secondary | ICD-10-CM

## 2016-06-17 LAB — POCT INR: INR: 1.9

## 2016-06-17 MED ORDER — WARFARIN SODIUM 5 MG PO TABS: ORAL_TABLET | ORAL | 0 refills | Status: DC

## 2016-06-17 MED ORDER — PRAVASTATIN SODIUM 40 MG PO TABS: 40.0000 mg | ORAL_TABLET | Freq: Every evening | ORAL | 3 refills | Status: DC

## 2016-06-17 NOTE — Progress Notes (Signed)
1. She can see Dr. Saunders Revel.  2. We can describe her conditions for disability but not sure she's going to get it.

## 2016-06-17 NOTE — Patient Instructions (Addendum)
Medication Instructions:  1. STOP LIPITOR 2. START PRAVASTATIN 40 MG DAILY; RX HAS BEEN SENT IN  Labwork: 08/12/16 FASTING LIPID AND LIVER PANEL  Testing/Procedures: NONE  Follow-Up: 1. YOU NEED A FOLLOW UP WITH DR. Lovena Le; DX TACHYCARDIA AND CHB; YOUR LAST SEEN IN 2015 WITH DR. Lovena Le 2. YOU WILL NEED A FOLLOW UP WITH SCOTT WEAVER, PAC IN 3 MONTHS SAME DAY DR, [facility] IS IN THE OFFICE IF POSSIBLE   Any Other Special Instructions Will Be Listed Below (If Applicable). YOU HAVE BEEN INSTRUCTED TO DECREASE CAFFEINE INTAKE YOU WILL HAVE YOUR INR CHECKED TODAY  If you need a refill on your cardiac medications before your next appointment, please call your pharmacy.

## 2016-06-21 ENCOUNTER — Encounter: Payer: Self-pay | Admitting: Internal Medicine

## 2016-06-22 ENCOUNTER — Encounter: Payer: Self-pay | Admitting: Physician Assistant

## 2016-06-22 ENCOUNTER — Ambulatory Visit (INDEPENDENT_AMBULATORY_CARE_PROVIDER_SITE_OTHER): Payer: Self-pay | Admitting: Internal Medicine

## 2016-06-22 ENCOUNTER — Ambulatory Visit: Payer: Self-pay | Admitting: Internal Medicine

## 2016-06-22 ENCOUNTER — Encounter: Payer: Self-pay | Admitting: Internal Medicine

## 2016-06-22 VITALS — BP 138/86 | HR 112 | Ht 62.0 in | Wt 139.2 lb

## 2016-06-22 DIAGNOSIS — R0602 Shortness of breath: Secondary | ICD-10-CM

## 2016-06-22 MED ORDER — LOVASTATIN ER 20 MG PO TB24: 20.0000 mg | ORAL_TABLET | Freq: Every day | ORAL | 3 refills | Status: DC

## 2016-06-22 NOTE — Patient Instructions (Addendum)
Medication Instructions:  STOP Pravastatin START Lovastatin 20 mg once daily   Labwork: None Ordered   Testing/Procedures: None Ordered   Follow-Up: Your physician recommends that you keep your follow-up appointment with Richardson Dopp, PA on 12/20   If you need a refill on your cardiac medications before your next appointment, please call your pharmacy.   Thank you for choosing [facility]! Christen Bame, RN [phone]

## 2016-06-22 NOTE — Progress Notes (Signed)
HPI Ms. Brands returns today for ongoing followup. She is a pleasant 55 yo woman with rheumatic heart disease, s/p MVR who developed post op CHB which eventually resolved. She has done well. She has been off of all AV nodal blocking drugs. She wore a cardiac monitor several weeks ago which showed spontaneously occuring CHB while asleep. She feels well and has had no problems except for weight gain. She notes dyspnea with exertion. Allergies  Allergen Reactions  . Oxycodone Itching     Current Outpatient Prescriptions  Medication Sig Dispense Refill  . acetaminophen (TYLENOL) 500 MG tablet Take 500 mg by mouth every 6 (six) hours as needed for mild pain or moderate pain.    [name] aspirin EC 81 MG tablet Take 1 tablet (81 mg total) by mouth daily.    [name] warfarin (COUMADIN) 5 MG tablet Take as directed by coumadin clinic 50 tablet 0  . WITCH HAZEL EX Apply 1 application topically at bedtime as needed (knee pain).      No current facility-administered medications for this visit.      Past Medical History:  Diagnosis Date  . Acute diastolic heart failure ([place]) 03/15/2013  . Anemia   . Asthma    "as a baby"   . Carotid stenosis    Carotid US 2/17: bilateral ICA 1-39% >> FU prn  . Complete heart block ([place]) 05/28/2013   Post-op  . Epigastric hernia 200's  . GERD (gastroesophageal reflux disease)   . History of blood transfusion    "14 w/1st pregnancy; 2 w/last C-section" (03/15/2013)  . Hx of echocardiogram    a. Echo 4/16:  Mild focal basal septal hypertrophy, EF 60-65%, no RWMA, Mechanical MVR ok with mild central regurgitation and no perivalvular leak, small mobile density attached to valvular ring (1.5x1 cm) - post surgical changes vs SBE, mild LAE;   b. Echo 2/17:  Mild post wall LVH, EF 55-60%, no RWMA, mechanical MVR ok (mean 5 mmHg), normal RVSF  . Hypertension    no pcp   will go to [facility]  . 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 S/P mitral valve replacement  with metallic valve [id number]   11mm Sorin Carbomedics mechanical prosthesis via right mini thoracotomy approach  . 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   Complex valvuloplasty including Cor-matrix ECM patch augmentation of anterior and lateral leaflets with 70mm Edwards mc3 ring annuloplasty via right mini-thoracotomy approach  . Severe mitral regurgitation 04/22/2013  . SVT (supraventricular tachycardia) ([place]) 03/16/2013  . Tricuspid regurgitation     ROS:   All systems reviewed and negative except as noted in the HPI.   Past Surgical History:  Procedure Laterality Date  . APPENDECTOMY  [id number]  . CARDIAC CATHETERIZATION    . [place]; 1999  . CESAREAN SECTION WITH BILATERAL TUBAL LIGATION  1999  . FEMORAL HERNIA REPAIR Right 05/23/2013   Procedure: HERNIA REPAIR FEMORAL;  Surgeon: Rexene Alberts, MD;  Location: [place];  Service: Open Heart Surgery;  Laterality: Right;  . INTRAOPERATIVE TRANSESOPHAGEAL ECHOCARDIOGRAM N/A 05/23/2013   Procedure: INTRAOPERATIVE TRANSESOPHAGEAL ECHOCARDIOGRAM;  Surgeon: Rexene Alberts, MD;  Location: [place];  Service: Open Heart Surgery;  Laterality: N/A;  . LAPAROSCOPIC CHOLECYSTECTOMY  2003  . LEFT AND RIGHT HEART CATHETERIZATION WITH CORONARY ANGIOGRAM N/A 03/22/2013   Procedure: LEFT AND RIGHT HEART CATHETERIZATION WITH CORONARY ANGIOGRAM;  Surgeon: Burnell Blanks, MD;  Location: [facility] CATH LAB;  Service: Cardiovascular;  Laterality: N/A;  .  MINIMALLY INVASIVE TRICUSPID VALVE REPAIR Right 05/23/2013   Procedure: MINIMALLY INVASIVE TRICUSPID VALVE REPAIR;  Surgeon: Rexene Alberts, MD;  Location: [place];  Service: Open Heart Surgery;  Laterality: Right;  . MITRAL VALVE REPLACEMENT N/A 05/23/2013   Procedure: MITRAL VALVE (MV) REPLACEMENT;  Surgeon: Rexene Alberts, MD;  Location: [place];  Service: Open Heart Surgery;  Laterality: N/A;  . MULTIPLE EXTRACTIONS WITH ALVEOLOPLASTY N/A 04/04/2013   Procedure: Extraction of tooth #'s  1,8,9,13,14,15,23,24,25,26 with alveoloplasty and gross debridement of remaining teeth;  Surgeon: Lenn Cal, DDS;  Location: [place];  Service: Oral Surgery;  Laterality: N/A;  . TEE WITHOUT CARDIOVERSION N/A 03/18/2013   Procedure: TRANSESOPHAGEAL ECHOCARDIOGRAM (TEE);  Surgeon: Larey Dresser, MD;  Location: [place];  Service: Cardiovascular;  Laterality: N/A;  . TEE WITHOUT CARDIOVERSION N/A 06/17/2013   Procedure: TRANSESOPHAGEAL ECHOCARDIOGRAM (TEE);  Surgeon: Lelon Perla, MD;  Location[facility] ENDOSCOPY;  Service: Cardiovascular;  Laterality: N/A;     Family History  Problem Relation Age of Onset  . Cancer Mother   . Stroke Father   . Sarcoidosis Sister   . Heart attack Neg Hx      Social History   Social History  . Marital status: Single    Spouse name: N/A  . Number of children: N/A  . Years of education: N/A   Occupational History  . Not on file.   Social History Main Topics  . Smoking status: Former Smoker    Packs/day: 0.50    Years: 36.00    Types: Cigarettes    Quit date: 03/15/2013  . Smokeless tobacco: Never Used  . Alcohol use 3.6 oz/week    6 Glasses of wine per week     Comment: 03/15/2013 "bottle of wine/wk"  . Drug use: No  . Sexual activity: No   Other Topics Concern  . Not on file   Social History Narrative  . No narrative on file     BP 138/86   Pulse (!) 112   Ht 5\' 2"  (1.575 m)   Wt 139 lb 3.2 oz (63.1 kg)   LMP 01/28/2013   SpO2 98%   BMI 25.46 kg/m   Physical Exam:  Well appearing middle aged woman, NAD HEENT: Unremarkable Neck:  6 cm JVD, no thyromegally Back:  No CVA tenderness Lungs:  Clear with no wheezes HEART:  Regular rate rhythm, soft systolic murmur, no rubs, no clicks, mechanic valve closure Abd:  soft, positive bowel sounds, no organomegally, no rebound, no guarding Ext:  2 plus pulses, no edema, no cyanosis, no clubbing Skin:  No rashes no nodules Neuro:  CN II through XII intact, motor grossly  intact  EKG - reviewed - NSR/sinus tachy with RAE   Assess/Plan: 1. S/p Mitral valve replacement - she is stable but still dyspneic. I think she is very deconditioned. I have encouraged her to increase her physical activity. 2. HTN - her blood pressure is a bit high. I have encouraged her to lose weight and reduce her salt intake.  3. Chronic diastolic heart failure - her symptoms are class 2-3. She will continue her current meds.  Mikle Bosworth.D.

## 2016-06-24 ENCOUNTER — Ambulatory Visit (INDEPENDENT_AMBULATORY_CARE_PROVIDER_SITE_OTHER): Payer: Self-pay | Admitting: Pharmacist

## 2016-06-24 DIAGNOSIS — I059 Rheumatic mitral valve disease, unspecified: Secondary | ICD-10-CM

## 2016-06-24 DIAGNOSIS — Z7901 Long term (current) use of anticoagulants: Secondary | ICD-10-CM

## 2016-06-24 DIAGNOSIS — Z952 Presence of prosthetic heart valve: Secondary | ICD-10-CM

## 2016-06-24 DIAGNOSIS — Z954 Presence of other heart-valve replacement: Secondary | ICD-10-CM

## 2016-06-24 LAB — POCT INR: INR: 2.7

## 2016-07-12 ENCOUNTER — Other Ambulatory Visit: Payer: Self-pay

## 2016-07-26 ENCOUNTER — Ambulatory Visit: Payer: Self-pay | Admitting: Family Medicine

## 2016-08-09 ENCOUNTER — Ambulatory Visit: Payer: Self-pay | Admitting: Nurse Practitioner

## 2016-08-10 ENCOUNTER — Encounter: Payer: Self-pay | Admitting: Nurse Practitioner

## 2016-08-12 ENCOUNTER — Ambulatory Visit (INDEPENDENT_AMBULATORY_CARE_PROVIDER_SITE_OTHER): Payer: Self-pay | Admitting: *Deleted

## 2016-08-12 DIAGNOSIS — I059 Rheumatic mitral valve disease, unspecified: Secondary | ICD-10-CM

## 2016-08-12 DIAGNOSIS — Z7901 Long term (current) use of anticoagulants: Secondary | ICD-10-CM

## 2016-08-12 DIAGNOSIS — Z952 Presence of prosthetic heart valve: Secondary | ICD-10-CM

## 2016-08-12 LAB — POCT INR: INR: 2.5

## 2016-08-12 MED ORDER — WARFARIN SODIUM 5 MG PO TABS: ORAL_TABLET | ORAL | 0 refills | Status: DC

## 2016-08-14 ENCOUNTER — Emergency Department (HOSPITAL_COMMUNITY)
Admission: EM | Admit: 2016-08-14 | Discharge: 2016-08-14 | Disposition: A | Discharge status: Home / Self Care | Payer: Medicaid Other | Attending: Emergency Medicine | Admitting: Emergency Medicine

## 2016-08-14 ENCOUNTER — Emergency Department (HOSPITAL_COMMUNITY): Payer: Medicaid Other

## 2016-08-14 ENCOUNTER — Encounter (HOSPITAL_COMMUNITY): Payer: Self-pay

## 2016-08-14 DIAGNOSIS — M25561 Pain in right knee: Secondary | ICD-10-CM | POA: Diagnosis not present

## 2016-08-14 DIAGNOSIS — W109XXA Fall (on) (from) unspecified stairs and steps, initial encounter: Secondary | ICD-10-CM | POA: Insufficient documentation

## 2016-08-14 DIAGNOSIS — Z7982 Long term (current) use of aspirin: Secondary | ICD-10-CM | POA: Insufficient documentation

## 2016-08-14 DIAGNOSIS — Z8673 Personal history of transient ischemic attack (TIA), and cerebral infarction without residual deficits: Secondary | ICD-10-CM | POA: Diagnosis not present

## 2016-08-14 DIAGNOSIS — I5031 Acute diastolic (congestive) heart failure: Secondary | ICD-10-CM | POA: Diagnosis not present

## 2016-08-14 DIAGNOSIS — Y999 Unspecified external cause status: Secondary | ICD-10-CM | POA: Insufficient documentation

## 2016-08-14 DIAGNOSIS — Z87891 Personal history of nicotine dependence: Secondary | ICD-10-CM | POA: Diagnosis not present

## 2016-08-14 DIAGNOSIS — Z7901 Long term (current) use of anticoagulants: Secondary | ICD-10-CM | POA: Diagnosis not present

## 2016-08-14 DIAGNOSIS — I110 Hypertensive heart disease with heart failure: Secondary | ICD-10-CM | POA: Insufficient documentation

## 2016-08-14 DIAGNOSIS — Y929 Unspecified place or not applicable: Secondary | ICD-10-CM | POA: Insufficient documentation

## 2016-08-14 DIAGNOSIS — Z79899 Other long term (current) drug therapy: Secondary | ICD-10-CM | POA: Diagnosis not present

## 2016-08-14 DIAGNOSIS — Y9301 Activity, walking, marching and hiking: Secondary | ICD-10-CM | POA: Diagnosis not present

## 2016-08-14 DIAGNOSIS — J45909 Unspecified asthma, uncomplicated: Secondary | ICD-10-CM | POA: Insufficient documentation

## 2016-08-14 MED ORDER — TRAMADOL HCL 50 MG PO TABS: 50.0000 mg | ORAL_TABLET | Freq: Four times a day (QID) | ORAL | 0 refills | Status: DC | PRN

## 2016-08-14 MED ORDER — TRAMADOL HCL 50 MG PO TABS
50.0000 mg | ORAL_TABLET | Freq: Once | ORAL | Status: AC
  Administered 2016-08-14: 50 mg via ORAL
  Filled 2016-08-14: qty 1

## 2016-08-14 NOTE — Discharge Instructions (Signed)
Read the information below.  Use the prescribed medication as directed.  Please discuss all new medications with your pharmacist.  You may return to the Emergency Department at any time for worsening condition or any new symptoms that concern you.  If you develop uncontrolled pain, weakness or numbness of the extremity, severe discoloration of the skin, or you are unable to walk or bend your knee, return to the ER for a recheck.

## 2016-08-14 NOTE — ED Provider Notes (Signed)
[place] DEPT Provider Note   CSN: [id number] Arrival date & time: 08/14/16  1326  By signing my name below, I, Maud Deed. Royston Sinner, attest that this documentation has been prepared under the direction and in the presence of [facility] PA-C.  Electronically Signed: Maud Deed. Royston Sinner, ED Scribe. 08/14/16. 2:33 PM.    History   Chief Complaint No chief complaint on file.  The history is provided by the patient. No language interpreter was used.    HPI Comments: Mary [name] is a 55 y.o. female without any pertinent past medical history who presents to the Emergency Department complaining of constant, unchanged R knee pain with associated swelling onset yesterday. Pt sates she was walking up the stairs when she missed a step and fell, landing on her R knee. Discomfort to knee is made worse with ambulation and certain movements. No alleviating factors at this time. OTC Tylenol along with ice application attempted prior to arrival without any improvement. No recent fever or chills. No numbness, loss of sensation, or weakness. Pt takes Coumadin daily.  PCP: Loralie Champagne, MD    Past Medical History:  Diagnosis Date  . Acute diastolic heart failure ([place]) 03/15/2013  . Anemia   . Asthma    "as a baby"   . Carotid stenosis    Carotid US 2/17: bilateral ICA 1-39% >> FU prn  . Complete heart block ([place]) 05/28/2013   Post-op  . Epigastric hernia 200's  . GERD (gastroesophageal reflux disease)   . History of blood transfusion    "14 w/1st pregnancy; 2 w/last C-section" (03/15/2013)  . Hx of echocardiogram    a. Echo 4/16:  Mild focal basal septal hypertrophy, EF 60-65%, no RWMA, Mechanical MVR ok with mild central regurgitation and no perivalvular leak, small mobile density attached to valvular ring (1.5x1 cm) - post surgical changes vs SBE, mild LAE;   b. Echo 2/17:  Mild post wall LVH, EF 55-60%, no RWMA, mechanical MVR ok (mean 5 mmHg), normal RVSF  . Hypertension    no pcp   will go to  [facility]  . 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 S/P mitral valve replacement with metallic valve 123XX123   38mm Sorin Carbomedics mechanical prosthesis via right mini thoracotomy approach  . 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   Complex valvuloplasty including Cor-matrix ECM patch augmentation of anterior and lateral leaflets with 42mm Edwards mc3 ring annuloplasty via right mini-thoracotomy approach  . Severe mitral regurgitation 04/22/2013  . SVT (supraventricular tachycardia) ([place]) 03/16/2013  . Tricuspid regurgitation     Patient Active Problem List   Diagnosis Date Noted  . Chest pain 06/05/2016  . AKI (acute kidney injury) ([place]) 01/10/2016  . Subtherapeutic international normalized ratio (INR) 01/10/2016  . TIA (transient ischemic attack) 09/13/2015  . History of rheumatic heart disease 09/13/2015  . CVA (cerebral vascular accident) ([place]) 09/12/2015  . First degree heart block 06/17/2013  . Weakness 06/16/2013  . (HFpEF) heart failure with preserved ejection fraction ([place]) 06/16/2013  . Anemia 06/16/2013  . History of bacterial endocarditis 06/16/2013  . Heart valve replaced by other means 06/10/2013  . Long term (current) use of anticoagulants 06/10/2013  . Complete heart block ([place]) 05/28/2013  . S/P minimally-invasive mitral valve replacement with mechanical valve and tricuspid valve repair 05/23/2013  . S/P minimally-invasive tricuspid valve repair 05/23/2013  . Femoral hernia 05/23/2013  . Severe mitral regurgitation 04/22/2013  . Mitral valve disorders(424.0) 04/22/2013  . Tricuspid regurgitation 04/22/2013  . Rheumatic  mitral stenosis with regurgitation 03/23/2013  . SVT (supraventricular tachycardia) ([place]) 03/16/2013    Past Surgical History:  Procedure Laterality Date  . APPENDECTOMY  [id number]  . CARDIAC CATHETERIZATION    . [place]; 1999  . CESAREAN SECTION WITH BILATERAL TUBAL LIGATION  1999  . FEMORAL HERNIA REPAIR Right  05/23/2013   Procedure: HERNIA REPAIR FEMORAL;  Surgeon: Rexene Alberts, MD;  Location: [place];  Service: Open Heart Surgery;  Laterality: Right;  . INTRAOPERATIVE TRANSESOPHAGEAL ECHOCARDIOGRAM N/A 05/23/2013   Procedure: INTRAOPERATIVE TRANSESOPHAGEAL ECHOCARDIOGRAM;  Surgeon: Rexene Alberts, MD;  Location: [place];  Service: Open Heart Surgery;  Laterality: N/A;  . LAPAROSCOPIC CHOLECYSTECTOMY  2003  . LEFT AND RIGHT HEART CATHETERIZATION WITH CORONARY ANGIOGRAM N/A 03/22/2013   Procedure: LEFT AND RIGHT HEART CATHETERIZATION WITH CORONARY ANGIOGRAM;  Surgeon: Burnell Blanks, MD;  Location: [facility] CATH LAB;  Service: Cardiovascular;  Laterality: N/A;  . MINIMALLY INVASIVE TRICUSPID VALVE REPAIR Right 05/23/2013   Procedure: MINIMALLY INVASIVE TRICUSPID VALVE REPAIR;  Surgeon: Rexene Alberts, MD;  Location: [place];  Service: Open Heart Surgery;  Laterality: Right;  . MITRAL VALVE REPLACEMENT N/A 05/23/2013   Procedure: MITRAL VALVE (MV) REPLACEMENT;  Surgeon: Rexene Alberts, MD;  Location: [place];  Service: Open Heart Surgery;  Laterality: N/A;  . MULTIPLE EXTRACTIONS WITH ALVEOLOPLASTY N/A 04/04/2013   Procedure: Extraction of tooth #'s 1,8,9,13,14,15,23,24,25,26 with alveoloplasty and gross debridement of remaining teeth;  Surgeon: Lenn Cal, DDS;  Location: [place];  Service: Oral Surgery;  Laterality: N/A;  . TEE WITHOUT CARDIOVERSION N/A 03/18/2013   Procedure: TRANSESOPHAGEAL ECHOCARDIOGRAM (TEE);  Surgeon: Larey Dresser, MD;  Location: [place];  Service: Cardiovascular;  Laterality: N/A;  . TEE WITHOUT CARDIOVERSION N/A 06/17/2013   Procedure: TRANSESOPHAGEAL ECHOCARDIOGRAM (TEE);  Surgeon: Lelon Perla, MD;  Location[facility] ENDOSCOPY;  Service: Cardiovascular;  Laterality: N/A;    OB History    No data available       Home Medications    Prior to Admission medications   Medication Sig Start Date End Date Taking? Authorizing Provider  acetaminophen (TYLENOL) 500 MG  tablet Take 500 mg by mouth every 6 (six) hours as needed for mild pain or moderate pain.    Historical Provider, MD  aspirin EC 81 MG tablet Take 1 tablet (81 mg total) by mouth daily. 08/26/13   Larey Dresser, MD  lovastatin (ALTOPREV) 20 MG 24 hr tablet Take 1 tablet (20 mg total) by mouth at bedtime. 06/22/16   Evans Lance, MD  traMADol (ULTRAM) 50 MG tablet Take 1 tablet (50 mg total) by mouth every 6 (six) hours as needed for moderate pain or severe pain. 08/14/16   Clayton Bibles, PA-C  warfarin (COUMADIN) 5 MG tablet Take as directed by coumadin clinic 08/12/16   Evans Lance, MD  [facility] HAZEL EX Apply 1 application topically at bedtime as needed (knee pain).     Historical Provider, MD    Family History Family History  Problem Relation Age of Onset  . Cancer Mother   . Stroke Father   . Sarcoidosis Sister   . Heart attack Neg Hx     Social History Social History  Substance Use Topics  . Smoking status: Former Smoker    Packs/day: 0.50    Years: 36.00    Types: Cigarettes    Quit date: 03/15/2013  . Smokeless tobacco: Never Used  . Alcohol use 3.6 oz/week    6  Glasses of wine per week     Comment: 03/15/2013 "bottle of wine/wk"     Allergies   Oxycodone   Review of Systems Review of Systems  Constitutional: Negative for chills and fever.  Cardiovascular: Negative for leg swelling.  Musculoskeletal: Positive for arthralgias.  Skin: Negative for pallor and wound.  Neurological: Negative for weakness and numbness.  Hematological: Bruises/bleeds easily.  Psychiatric/Behavioral: Negative for self-injury.     Physical Exam Updated Vital Signs BP 127/89 (BP Location: Right Arm)   Pulse 109   Temp 98.3 F (36.8 C) (Oral)   Resp 18   LMP 01/28/2013   SpO2 99%   Physical Exam  Constitutional: She appears well-developed and well-nourished. No distress.  HENT:  Head: Normocephalic and atraumatic.  Neck: Neck supple.  Pulmonary/Chest: Effort normal.    Musculoskeletal: She exhibits tenderness.  Diffuse tenderness to R anterior knee. No skin disruption. No calf tenderness.Distal pulse and sensation intact. No other focal tenderness. All compartments are soft.  Neurological: She is alert.  Skin: She is not diaphoretic.  Nursing note and vitals reviewed.    ED Treatments / Results   DIAGNOSTIC STUDIES: Oxygen Saturation is 99% on RA, Normal by my interpretation.    COORDINATION OF CARE: 1:37 PM- Will order imaging. Will give Ultram. Discussed treatment plan with pt at bedside and pt agreed to plan.      Labs (all labs ordered are listed, but only abnormal results are displayed) Labs Reviewed - No data to display  EKG  EKG Interpretation None       Radiology Dg Knee Complete 4 Views Right  Result Date: 08/14/2016 CLINICAL DATA:  Pain following fall EXAM: RIGHT KNEE - COMPLETE 4+ VIEW COMPARISON:  None. FINDINGS: Frontal, lateral, and bilateral oblique views were obtained. There is no fracture or dislocation. No joint effusion. The joint spaces appear normal. No erosive change. IMPRESSION: No fracture or joint effusion.  No appreciable arthropathy. Electronically Signed   By: Lowella Grip III M.D.   On: 08/14/2016 15:04    Procedures Procedures (including critical care time)  Medications Ordered in ED Medications  traMADol (ULTRAM) tablet 50 mg (50 mg Oral Given 08/14/16 1443)     Initial Impression / Assessment and Plan / ED Course  I have reviewed the triage vital signs and the nursing notes.  Pertinent labs & imaging results that were available during my care of the patient were reviewed by me and considered in my medical decision making (see chart for details).  Clinical Course     Afebrile, nontoxic patient with injury to her right knee while tripping and falling up stairs.   Xray negative.   D/C home with ace wrap, pain medication, PCP follow up.  Pt declined crutches.   Discussed result, findings,  treatment, and follow up  with patient.  Pt given return precautions.  Pt verbalizes understanding and agrees with plan.      Final Clinical Impressions(s) / ED Diagnoses    Final diagnoses:  Acute pain of right knee    New Prescriptions Discharge Medication List as of 08/14/2016  3:13 PM    START taking these medications   Details  traMADol (ULTRAM) 50 MG tablet Take 1 tablet (50 mg total) by mouth every 6 (six) hours as needed for moderate pain or severe pain., Starting Sun 08/14/2016, Print       I personally performed the services described in this documentation, which was scribed in my presence. The recorded information has been  reviewed and is accurate.    Clayton Bibles, PA-C 08/14/16 [place], MD 08/14/16 [phone]

## 2016-08-14 NOTE — ED Triage Notes (Signed)
Patient complains of right knee pain after falling up steps yesterday. Pain with ambulation, NAD

## 2016-08-15 ENCOUNTER — Telehealth: Payer: Self-pay | Admitting: Internal Medicine

## 2016-08-15 NOTE — Telephone Encounter (Signed)
Walk in pt Form-Staff Medical Report-dropped off gave to [place] to take around to [place].

## 2016-09-05 ENCOUNTER — Telehealth: Payer: Self-pay

## 2016-09-05 NOTE — Telephone Encounter (Signed)
Called, left voice message medical form complete.

## 2016-09-14 ENCOUNTER — Ambulatory Visit: Payer: Self-pay | Admitting: Physician Assistant

## 2016-09-14 NOTE — Progress Notes (Deleted)
Cardiology Office Note:    Date:  09/14/2016   ID:  [name], DOB 11/17/1960, MRN [id number]  PCP:  Mary Champagne, MD  Cardiologist:  Dr. Loralie [name]  >> Dr. Harrell Gave End  Electrophysiologist:  Dr. Cristopher Peru   Referring MD: Larey Dresser, MD   No chief complaint on file. ***  History of Present Illness:    Mary [name] is a 55 y.o. 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 Post op course c/b CHB that recovered without needing a PPM. She was kept off all AVN blocking agents and wore a Holter monitor in 7/15 which demonstrated asymptomatic CHB while sleeping. She saw Dr. Cristopher Peru and pacemaker was not recommended as CHB only occurred during sleep. In 6/17, she was placed on Toprol-XL 25 mg QD given palpitations and sinus tachycardia.  Event monitor demonstrated high grade heart block at night and her beta-blocker was to be DC'd - but she had never started the medication.  She was then admitted in 9/17 with a TIA.  Her INR was sub-therapeutic.  Echocardiogram demonstrated normal LV function, normally functioning mechanical mitral valve prosthesis and normally functioning tricuspid valve repair.   Last seen by me in 9/17.  She continued to have palpitations.  I referred her back to EP.  She saw Dr. Cristopher Peru. No further recommendations were made.  She returns for Cardiology follow up.  ***   Prior CV studies that were reviewed today include:    Event Monitor 02/24/16 1 episode of high grade heart block at night. Otherwise, mild sinus tachycardia.   Carotid US 9/17 Bilateral: soft plaque throughout CCA, ICA and ECA. 1-39% ICA plaquing. Vertebral artery flow is antegrade.  Echo 06/06/16 EF 55-60%, mechanical MVR functioning normally, mild RAE, TV repair functioning normally  Echo 11/03/15 Posterior wall mild LVH, EF 55-60%, normal wall motion, mechanical MVR okay, normal  RVSF  Echo 01/01/15 Mild focal basal septal hypertrophy, EF 60-65%, normal wall motion, S/P MVR (St Jude mechanical valve) functioning well, mild MR (small mobile echodensity-likely redundant chord versus suture), mild LAE, TV mean gradient 4 mmHg.   Echo 7/15 Mild LVH, EF 60-65%, normal wall motion, grade 1 diastolic dysfunction, S/P MVR (St Jude mechanical valve) and appears functioning well, mild MR, no perivalvular leak, mild LAE, mildly reduced RVSF.   Carotid US 8/14 Bilateral - 1% to 39% ICA stenosis   Cardiac cath 6/14 Left main: No obstructive disease.  Left Anterior Descending Artery: No obstructive disease noted.  Circumflex Artery: No obstructive disease.  Right Coronary Artery: no obstructive disease.  Left Ventricular Angiogram: LVEF 65-70%. 3+ MR Distal Aortogram: No aneurysm. No stenosis.   Past Medical History:  Diagnosis Date  . Acute diastolic heart failure ([place]) 03/15/2013  . Anemia   . Asthma    "as a baby"   . Carotid stenosis    Carotid US 2/17: bilateral ICA 1-39% >> FU prn  . Complete heart block ([place]) 05/28/2013   Post-op  . Epigastric hernia 200's  . GERD (gastroesophageal reflux disease)   . History of blood transfusion    "14 w/1st pregnancy; 2 w/last C-section" (03/15/2013)  . Hx of echocardiogram    a. Echo 4/16:  Mild focal basal septal hypertrophy, EF 60-65%, no RWMA, Mechanical MVR ok with mild central regurgitation and no perivalvular leak, small mobile density attached to valvular ring (1.5x1 cm) - post surgical  changes vs SBE, mild LAE;   b. Echo 2/17:  Mild post wall LVH, EF 55-60%, no RWMA, mechanical MVR ok (mean 5 mmHg), normal RVSF  . Hypertension    no pcp   will go to [facility]  . 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 S/P mitral valve replacement with metallic valve 123XX123   80mm Sorin Carbomedics mechanical prosthesis via right mini thoracotomy approach  . 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   Complex  valvuloplasty including Cor-matrix ECM patch augmentation of anterior and lateral leaflets with 55mm Edwards mc3 ring annuloplasty via right mini-thoracotomy approach  . Severe mitral regurgitation 04/22/2013  . SVT (supraventricular tachycardia) ([place]) 03/16/2013  . Tricuspid regurgitation     Past Surgical History:  Procedure Laterality Date  . APPENDECTOMY  [id number]  . CARDIAC CATHETERIZATION    . [place]; 1999  . CESAREAN SECTION WITH BILATERAL TUBAL LIGATION  1999  . FEMORAL HERNIA REPAIR Right 05/23/2013   Procedure: HERNIA REPAIR FEMORAL;  Surgeon: Rexene Alberts, MD;  Location: [place];  Service: Open Heart Surgery;  Laterality: Right;  . INTRAOPERATIVE TRANSESOPHAGEAL ECHOCARDIOGRAM N/A 05/23/2013   Procedure: INTRAOPERATIVE TRANSESOPHAGEAL ECHOCARDIOGRAM;  Surgeon: Rexene Alberts, MD;  Location: [place];  Service: Open Heart Surgery;  Laterality: N/A;  . LAPAROSCOPIC CHOLECYSTECTOMY  2003  . LEFT AND RIGHT HEART CATHETERIZATION WITH CORONARY ANGIOGRAM N/A 03/22/2013   Procedure: LEFT AND RIGHT HEART CATHETERIZATION WITH CORONARY ANGIOGRAM;  Surgeon: Burnell Blanks, MD;  Location: [facility] CATH LAB;  Service: Cardiovascular;  Laterality: N/A;  . MINIMALLY INVASIVE TRICUSPID VALVE REPAIR Right 05/23/2013   Procedure: MINIMALLY INVASIVE TRICUSPID VALVE REPAIR;  Surgeon: Rexene Alberts, MD;  Location: [place];  Service: Open Heart Surgery;  Laterality: Right;  . MITRAL VALVE REPLACEMENT N/A 05/23/2013   Procedure: MITRAL VALVE (MV) REPLACEMENT;  Surgeon: Rexene Alberts, MD;  Location: [place];  Service: Open Heart Surgery;  Laterality: N/A;  . MULTIPLE EXTRACTIONS WITH ALVEOLOPLASTY N/A 04/04/2013   Procedure: Extraction of tooth #'s 1,8,9,13,14,15,23,24,25,26 with alveoloplasty and gross debridement of remaining teeth;  Surgeon: Lenn Cal, DDS;  Location: [place];  Service: Oral Surgery;  Laterality: N/A;  . TEE WITHOUT CARDIOVERSION N/A 03/18/2013   Procedure: TRANSESOPHAGEAL  ECHOCARDIOGRAM (TEE);  Surgeon: Larey Dresser, MD;  Location: [place];  Service: Cardiovascular;  Laterality: N/A;  . TEE WITHOUT CARDIOVERSION N/A 06/17/2013   Procedure: TRANSESOPHAGEAL ECHOCARDIOGRAM (TEE);  Surgeon: Lelon Perla, MD;  Location: [facility] ENDOSCOPY;  Service: Cardiovascular;  Laterality: N/A;    Current Medications: No outpatient prescriptions have been marked as taking for the 09/14/16 encounter (Appointment) with Liliane Shi, PA-C.     Allergies:   Oxycodone   Social History   Social History  . Marital status: Single    Spouse name: N/A  . Number of children: N/A  . Years of education: N/A   Social History Main Topics  . Smoking status: Former Smoker    Packs/day: 0.50    Years: 36.00    Types: Cigarettes    Quit date: 03/15/2013  . Smokeless tobacco: Never Used  . Alcohol use 3.6 oz/week    6 Glasses of wine per week     Comment: 03/15/2013 "bottle of wine/wk"  . Drug use: No  . Sexual activity: No   Other Topics Concern  . Not on file   Social History Narrative  . No narrative on file     Family History:  The patient's ***family  history includes Cancer in her mother; Sarcoidosis in her sister; Stroke in her father.   ROS:   Please see the history of present illness.    ROS All other systems reviewed and are negative.   EKGs/Labs/Other Test Reviewed:    EKG:  EKG is *** ordered today.  The ekg ordered today demonstrates ***  Recent Labs: 01/10/2016: TSH 1.725 01/11/2016: Magnesium 1.6 06/05/2016: ALT 13 06/09/2016: BUN 5; Creatinine, Ser 0.76; Hemoglobin 11.2; Platelets 303; Potassium 3.9; Sodium 140   Recent Lipid Panel    Component Value Date/Time   CHOL 212 (H) 06/06/2016 0135   TRIG 108 06/06/2016 0135   HDL 66 06/06/2016 0135   CHOLHDL 3.2 06/06/2016 0135   VLDL 22 06/06/2016 0135   LDLCALC 124 (H) 06/06/2016 0135     Physical Exam:    VS:  LMP 01/28/2013     Wt Readings from Last 3 Encounters:  06/22/16 139 lb 3.2  oz (63.1 kg)  06/17/16 135 lb 6.4 oz (61.4 kg)  06/06/16 144 lb 2.9 oz (65.4 kg)     ***Physical Exam  ASSESSMENT:    No diagnosis found. PLAN:    In order of problems listed above:  1. Symptomatic Sinus Tachycardia -  She has had a history of SVT and a history of complete heart block. AVN blocking agents have been avoided.  I had her see Dr. Lovena Le.  He felt that she is deconditioned and made no further recommendations.  ***  2. S/P minimally-invasive mitral valve replacement with mechanical valve and tricuspid valve repair - Recent echo with normally functioning mechanical MVR and TV repair.  Continue coumadin.  INR goal 2.5-3.5.  Continue ASA.  Continue SBE prophylaxis.  ***  3. History of TIA -  In the setting of subtherapeutic INR.  Continue Coumadin.  ***  4. HL - ***   Medication Adjustments/Labs and Tests Ordered: Current medicines are reviewed at length with the patient today.  Concerns regarding medicines are outlined above.  Medication changes, Labs and Tests ordered today are outlined in the Patient Instructions noted below. There are no Patient Instructions on file for this visit. Signed, Richardson Dopp, PA-C  09/14/2016 1:22 PM    [facility] [address], [place], [place]  [address] Phone: [phone]; Fax: [phone]

## 2016-09-20 ENCOUNTER — Encounter: Payer: Self-pay | Admitting: Physician Assistant

## 2016-10-14 ENCOUNTER — Other Ambulatory Visit: Payer: Self-pay | Admitting: *Deleted

## 2016-10-14 ENCOUNTER — Telehealth: Payer: Self-pay | Admitting: *Deleted

## 2016-10-14 MED ORDER — WARFARIN SODIUM 5 MG PO TABS: ORAL_TABLET | ORAL | 0 refills | Status: DC

## 2016-10-14 NOTE — Telephone Encounter (Signed)
Spoke with pt and will sent in 7 tablets as she has appt to be seen on Monday 22nd Pt states understanding

## 2016-10-18 ENCOUNTER — Ambulatory Visit (INDEPENDENT_AMBULATORY_CARE_PROVIDER_SITE_OTHER): Payer: Self-pay | Admitting: *Deleted

## 2016-10-18 DIAGNOSIS — Z952 Presence of prosthetic heart valve: Secondary | ICD-10-CM

## 2016-10-18 DIAGNOSIS — I059 Rheumatic mitral valve disease, unspecified: Secondary | ICD-10-CM

## 2016-10-18 DIAGNOSIS — Z7901 Long term (current) use of anticoagulants: Secondary | ICD-10-CM

## 2016-10-18 LAB — POCT INR: INR: 1.3

## 2016-10-18 MED ORDER — WARFARIN SODIUM 5 MG PO TABS: ORAL_TABLET | ORAL | 0 refills | Status: DC

## 2016-11-05 ENCOUNTER — Emergency Department (HOSPITAL_COMMUNITY): Payer: Self-pay

## 2016-11-05 ENCOUNTER — Emergency Department (HOSPITAL_COMMUNITY)
Admission: EM | Admit: 2016-11-05 | Discharge: 2016-11-05 | Disposition: A | Discharge status: Home / Self Care | Payer: Self-pay | Attending: Emergency Medicine | Admitting: Emergency Medicine

## 2016-11-05 ENCOUNTER — Encounter (HOSPITAL_COMMUNITY): Payer: Self-pay

## 2016-11-05 DIAGNOSIS — S0990XA Unspecified injury of head, initial encounter: Secondary | ICD-10-CM | POA: Insufficient documentation

## 2016-11-05 DIAGNOSIS — I5031 Acute diastolic (congestive) heart failure: Secondary | ICD-10-CM | POA: Insufficient documentation

## 2016-11-05 DIAGNOSIS — Z7901 Long term (current) use of anticoagulants: Secondary | ICD-10-CM | POA: Insufficient documentation

## 2016-11-05 DIAGNOSIS — I110 Hypertensive heart disease with heart failure: Secondary | ICD-10-CM | POA: Insufficient documentation

## 2016-11-05 DIAGNOSIS — Z7982 Long term (current) use of aspirin: Secondary | ICD-10-CM | POA: Insufficient documentation

## 2016-11-05 DIAGNOSIS — W228XXA Striking against or struck by other objects, initial encounter: Secondary | ICD-10-CM | POA: Insufficient documentation

## 2016-11-05 DIAGNOSIS — Y999 Unspecified external cause status: Secondary | ICD-10-CM | POA: Insufficient documentation

## 2016-11-05 DIAGNOSIS — Z79899 Other long term (current) drug therapy: Secondary | ICD-10-CM | POA: Insufficient documentation

## 2016-11-05 DIAGNOSIS — S99921A Unspecified injury of right foot, initial encounter: Secondary | ICD-10-CM

## 2016-11-05 DIAGNOSIS — S9031XA Contusion of right foot, initial encounter: Secondary | ICD-10-CM | POA: Insufficient documentation

## 2016-11-05 DIAGNOSIS — Z8673 Personal history of transient ischemic attack (TIA), and cerebral infarction without residual deficits: Secondary | ICD-10-CM | POA: Insufficient documentation

## 2016-11-05 DIAGNOSIS — Y9301 Activity, walking, marching and hiking: Secondary | ICD-10-CM | POA: Insufficient documentation

## 2016-11-05 DIAGNOSIS — J45909 Unspecified asthma, uncomplicated: Secondary | ICD-10-CM | POA: Insufficient documentation

## 2016-11-05 DIAGNOSIS — Y9289 Other specified places as the place of occurrence of the external cause: Secondary | ICD-10-CM | POA: Insufficient documentation

## 2016-11-05 MED ORDER — TRAMADOL HCL 50 MG PO TABS
50.0000 mg | ORAL_TABLET | Freq: Once | ORAL | Status: AC
  Administered 2016-11-05: 50 mg via ORAL
  Filled 2016-11-05: qty 1

## 2016-11-05 MED ORDER — TRAMADOL HCL 50 MG PO TABS: 50.0000 mg | ORAL_TABLET | Freq: Four times a day (QID) | ORAL | 0 refills | Status: DC | PRN

## 2016-11-05 NOTE — Discharge Instructions (Signed)
Please read attached information. If you experience any new or worsening signs or symptoms please return to the emergency room for evaluation. Please follow-up with your primary care provider or specialist as discussed. Please use medication prescribed only as directed and discontinue taking if you have any concerning signs or symptoms.   °

## 2016-11-05 NOTE — ED Notes (Signed)
Mary [name] techs been called.

## 2016-11-05 NOTE — ED Provider Notes (Signed)
[facility] DEPT Provider Note   CSN: [id number] Arrival date & time: 11/05/16  1006     History   Chief Complaint Chief Complaint  Patient presents with  . Foot Injury/ syncope    HPI Mary [name] is a 56 y.o. female.  HPI    56 year old female presents today with complaints of right foot pain. Patient notes that she has chronic dizziness. She notes she was walking to the bathroom yesterday when she had an episode of dizziness that caused her to pass out and hit her head. She notes when she did that someone came to help her up and stepped on her foot. She notes pain over the right first metatarsal and midfoot. She reports significant pain with ambulation and bruising. No other injuries noted. Patient reports yesterday's episode was not abnormal for her, she has no associated neurological deficits, feels back to her baseline with no dizziness, lightheadedness, chest pain shortness of breath, or any other significant signs or symptoms. Patient reports taking  Coumadin status post MR repair.    Past Medical History:  Diagnosis Date  . Acute diastolic heart failure ([place]) 03/15/2013  . Anemia   . Asthma    "as a baby"   . Carotid stenosis    Carotid US 2/17: bilateral ICA 1-39% >> FU prn  . Complete heart block ([place]) 05/28/2013   Post-op  . Epigastric hernia 200's  . GERD (gastroesophageal reflux disease)   . History of blood transfusion    "14 w/1st pregnancy; 2 w/last C-section" (03/15/2013)  . Hx of echocardiogram    a. Echo 4/16:  Mild focal basal septal hypertrophy, EF 60-65%, no RWMA, Mechanical MVR ok with mild central regurgitation and no perivalvular leak, small mobile density attached to valvular ring (1.5x1 cm) - post surgical changes vs SBE, mild LAE;   b. Echo 2/17:  Mild post wall LVH, EF 55-60%, no RWMA, mechanical MVR ok (mean 5 mmHg), normal RVSF  . Hypertension    no pcp   will go to [facility]  . 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 S/P mitral  valve replacement with metallic valve [id number]   30mm Sorin Carbomedics mechanical prosthesis via right mini thoracotomy approach  . 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 Severe mitral regurgitation 04/22/2013  . SVT (supraventricular tachycardia) ([place]) 03/16/2013  . Tricuspid regurgitation     Patient Active Problem List   Diagnosis Date Noted  . Chest pain 06/05/2016  . AKI (acute kidney injury) ([place]) 01/10/2016  . Subtherapeutic international normalized ratio (INR) 01/10/2016  . TIA (transient ischemic attack) 09/13/2015  . History of rheumatic heart disease 09/13/2015  . CVA (cerebral vascular accident) ([place]) 09/12/2015  . First degree heart block 06/17/2013  . Weakness 06/16/2013  . (HFpEF) heart failure with preserved ejection fraction ([place]) 06/16/2013  . Anemia 06/16/2013  . History of bacterial endocarditis 06/16/2013  . Heart valve replaced by other means 06/10/2013  . Long term (current) use of anticoagulants 06/10/2013  . Complete heart block ([place]) 05/28/2013  . S/P minimally-invasive mitral valve replacement with mechanical valve and tricuspid valve repair 05/23/2013  . S/P minimally-invasive tricuspid valve repair 05/23/2013  . Femoral hernia 05/23/2013  . Severe mitral regurgitation 04/22/2013  . Mitral valve disorders(424.0) 04/22/2013  . Tricuspid regurgitation 04/22/2013  . 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 SVT (supraventricular  tachycardia) ([place]) 03/16/2013    Past Surgical History:  Procedure Laterality Date  . APPENDECTOMY  [id number]  . CARDIAC CATHETERIZATION    . [place]; 1999  . CESAREAN SECTION WITH BILATERAL TUBAL LIGATION  1999  . FEMORAL HERNIA REPAIR Right 05/23/2013   Procedure: HERNIA REPAIR FEMORAL;  Surgeon: Rexene Alberts, MD;   Location: [facility];  Service: Open Heart Surgery;  Laterality: Right;  . INTRAOPERATIVE TRANSESOPHAGEAL ECHOCARDIOGRAM N/A 05/23/2013   Procedure: INTRAOPERATIVE TRANSESOPHAGEAL ECHOCARDIOGRAM;  Surgeon: Rexene Alberts, MD;  Location: [facility];  Service: Open Heart Surgery;  Laterality: N/A;  . LAPAROSCOPIC CHOLECYSTECTOMY  2003  . LEFT AND RIGHT HEART CATHETERIZATION WITH CORONARY ANGIOGRAM N/A 03/22/2013   Procedure: LEFT AND RIGHT HEART CATHETERIZATION WITH CORONARY ANGIOGRAM;  Surgeon: Burnell Blanks, MD;  Location: [facility] CATH LAB;  Service: Cardiovascular;  Laterality: N/A;  . MINIMALLY INVASIVE TRICUSPID VALVE REPAIR Right 05/23/2013   Procedure: MINIMALLY INVASIVE TRICUSPID VALVE REPAIR;  Surgeon: Rexene Alberts, MD;  Location: [facility];  Service: Open Heart Surgery;  Laterality: Right;  . MITRAL VALVE REPLACEMENT N/A 05/23/2013   Procedure: MITRAL VALVE (MV) REPLACEMENT;  Surgeon: Rexene Alberts, MD;  Location: [facility];  Service: Open Heart Surgery;  Laterality: N/A;  . MULTIPLE EXTRACTIONS WITH ALVEOLOPLASTY N/A 04/04/2013   Procedure: Extraction of tooth #'s 1,8,9,13,14,15,23,24,25,26 with alveoloplasty and gross debridement of remaining teeth;  Surgeon: Lenn Cal, DDS;  Location: [facility];  Service: Oral Surgery;  Laterality: N/A;  . TEE WITHOUT CARDIOVERSION N/A 03/18/2013   Procedure: TRANSESOPHAGEAL ECHOCARDIOGRAM (TEE);  Surgeon: Larey Dresser, MD;  Location: [facility];  Service: Cardiovascular;  Laterality: N/A;  . TEE WITHOUT CARDIOVERSION N/A 06/17/2013   Procedure: TRANSESOPHAGEAL ECHOCARDIOGRAM (TEE);  Surgeon: Lelon Perla, MD;  Location: [facility] ENDOSCOPY;  Service: Cardiovascular;  Laterality: N/A;    OB History    No data available       Home Medications    Prior to Admission medications   Medication Sig Start Date End Date Taking? Authorizing Provider  acetaminophen (TYLENOL) 500 MG tablet Take 500 mg by mouth every 6 (six) hours as needed for mild pain or moderate  pain.   Yes Historical Provider, MD  aspirin EC 81 MG tablet Take 1 tablet (81 mg total) by mouth daily. 08/26/13  Yes Larey Dresser, MD  warfarin (COUMADIN) 5 MG tablet Take as directed by coumadin clinic Patient taking differently: Take 5-7.5 mg by mouth one time only at 6 PM. Take as directed by coumadin clinic. ( 7.5 mg everyday except on Tuesday and Thursday patient takes 5  mg 10/18/16  Yes Evans Lance, MD  [facility] HAZEL EX Apply 1 application topically at bedtime as needed (knee pain).    Yes Historical Provider, MD  lovastatin (ALTOPREV) 20 MG 24 hr tablet Take 1 tablet (20 mg total) by mouth at bedtime. Patient not taking: Reported on 11/05/2016 06/22/16   Evans Lance, MD  traMADol (ULTRAM) 50 MG tablet Take 1 tablet (50 mg total) by mouth every 6 (six) hours as needed. 11/05/16   Okey Regal, PA-C    Family History Family History  Problem Relation Age of Onset  . Cancer Mother   . Stroke Father   . Sarcoidosis Sister   . Heart attack Neg Hx     Social History Social History  Substance Use Topics  . Smoking status: Former Smoker    Packs/day: 0.50    Years: 36.00  Types: Cigarettes    Quit date: 03/15/2013  . Smokeless tobacco: Never Used  . Alcohol use 3.6 oz/week    6 Glasses of wine per week     Comment: 03/15/2013 "bottle of wine/wk"     Allergies   Oxycodone   Review of Systems Review of Systems  All other systems reviewed and are negative.    Physical Exam Updated Vital Signs BP 120/84   Pulse 106   Temp 98.3 F (36.8 C) (Oral)   Resp 18   LMP 03/22/2013   SpO2 98%   Physical Exam  Constitutional: She is oriented to person, place, and time. She appears well-developed and well-nourished.  HENT:  Head: Normocephalic and atraumatic.  Very minor tenderness to palpation of the right posterior skull no significant deformities  Eyes: Conjunctivae are normal. Pupils are equal, round, and reactive to light. Right eye exhibits no discharge. Left eye  exhibits no discharge. No scleral icterus.  Neck: Normal range of motion. No JVD present. No tracheal deviation present.  Cardiovascular: Regular rhythm.   Pulmonary/Chest: Effort normal. No stridor.  Musculoskeletal:  Minor bruising over the right first MTP  Neurological: She is alert and oriented to person, place, and time. She has normal strength. No cranial nerve deficit or sensory deficit. Coordination normal. GCS eye subscore is 4. GCS verbal subscore is 5. GCS motor subscore is 6.  No dizziness upon standing  Skin: Skin is warm.  Psychiatric: She has a normal mood and affect. Her behavior is normal. Judgment and thought content normal.  Nursing note and vitals reviewed.    ED Treatments / Results  Labs (all labs ordered are listed, but only abnormal results are displayed) Labs Reviewed - No data to display  EKG  EKG Interpretation None       Radiology Dg Chest 2 View  Result Date: 11/05/2016 CLINICAL DATA:  Cough, shortness of breath for 3 months EXAM: CHEST  2 VIEW COMPARISON:  06/06/2016 FINDINGS: Prior valve replacement. Hyperinflation. Lungs are clear. No effusions or acute bony abnormality. Heart is upper limits normal in size. IMPRESSION: Hyperinflation.  No active disease. Electronically Signed   By: Rolm Baptise M.D.   On: 11/05/2016 12:54   Ct Head Wo Contrast  Result Date: 11/05/2016 CLINICAL DATA:  Patient fell last night and hit the back of her head on the edge of the tub. Patient +LOC EXAM: CT HEAD WITHOUT CONTRAST TECHNIQUE: Contiguous axial images were obtained from the base of the skull through the vertex without intravenous contrast. COMPARISON:  MR 06/06/2016 and previous FINDINGS: Brain: No evidence of acute infarction, hemorrhage, hydrocephalus, extra-axial collection or mass lesion/mass effect. Mild atrophy. Vascular: No hyperdense vessel or unexpected calcification. Skull: Normal. Negative for fracture or focal lesion. Sinuses/Orbits: No acute finding.  Other: None. IMPRESSION: 1. Negative for bleed or other acute intracranial process. Electronically Signed   By: Lucrezia Europe M.D.   On: 11/05/2016 13:37   Dg Foot Complete Right  Result Date: 11/05/2016 CLINICAL DATA:  Right foot was stepped on last night and is painful, mainly in the region of the instep; EXAM: RIGHT FOOT COMPLETE - 3+ VIEW COMPARISON:  None. FINDINGS: There is no evidence of fracture or dislocation. There is no evidence of arthropathy or other focal bone abnormality. Soft tissues are unremarkable. IMPRESSION: Negative. Electronically Signed   By: Lucrezia Europe M.D.   On: 11/05/2016 11:21    Procedures Procedures (including critical care time)  Medications Ordered in ED Medications  traMADol (ULTRAM)  tablet 50 mg (50 mg Oral Given 11/05/16 1350)     Initial Impression / Assessment and Plan / ED Course  I have reviewed the triage vital signs and the nursing notes.  Pertinent labs & imaging results that were available during my care of the patient were reviewed by me and considered in my medical decision making (see chart for details).     Final Clinical Impressions(s) / ED Diagnoses   Final diagnoses:  Injury of toe on right foot, initial encounter    Labs:   Imaging: CT head without, DG chest 2 view , DG foot complete right  Consults:  Therapeutics: ultram  Discharge Meds: Ultram  Assessment/Plan: 56 year old female presents today with foot pain. Patient had a syncopal episode, patient reports this is not abnormal and is back to her baseline with no significant complaints. She has no chest pain or shortness of breath, no signs of DVT or PE, no acute concern for cardiac dysfunction at this time. Patient has bruising over the right great toe with findings on the x-ray that could indicate fracture although this was read as negative. She'll be treated with a postop shoe, Ultram, orthopedic follow-up. Patient reports she will follow up as indicated, return immediately if  any new or worsening signs or symptoms present. Patient verbalized understanding and agreement to today's plan had no further questions or concerns at the time discharge    New Prescriptions Discharge Medication List as of 11/05/2016  2:52 PM       Okey Regal, PA-C 11/05/16 2045    Daleen Bo, MD 11/06/16 [id number]

## 2016-11-05 NOTE — ED Triage Notes (Signed)
Patient here with right foot pain after reporting that she became dizzy last night and fell hiting head and foot, states she thinks she was knocked out. Alert and oriented on assessment.

## 2016-11-05 NOTE — ED Notes (Signed)
Pt in X-Ray ?

## 2016-11-05 NOTE — Progress Notes (Signed)
Orthopedic Tech Progress Note Patient Details:  Mary [name] Sep 01, 1961 [id number]  Ortho Devices Type of Ortho Device: Crutches, Postop shoe/boot Ortho Device/Splint Interventions: Application   [name] 11/05/2016, 2:44 PM

## 2016-11-05 NOTE — ED Notes (Signed)
Pt called for assistance while in bathroom, reports noting bright red blood from rectum; pt reports rectal pain and h/o hemorrhoids.

## 2016-11-05 NOTE — ED Notes (Signed)
Pt transported to xray 

## 2016-11-22 ENCOUNTER — Ambulatory Visit (INDEPENDENT_AMBULATORY_CARE_PROVIDER_SITE_OTHER): Payer: Self-pay | Admitting: Pharmacist

## 2016-11-22 DIAGNOSIS — Z952 Presence of prosthetic heart valve: Secondary | ICD-10-CM

## 2016-11-22 DIAGNOSIS — Z7901 Long term (current) use of anticoagulants: Secondary | ICD-10-CM

## 2016-11-22 DIAGNOSIS — I059 Rheumatic mitral valve disease, unspecified: Secondary | ICD-10-CM

## 2016-11-22 LAB — POCT INR: INR: 2.9

## 2016-12-09 ENCOUNTER — Other Ambulatory Visit: Payer: Self-pay | Admitting: Pharmacist

## 2016-12-10 ENCOUNTER — Other Ambulatory Visit: Payer: Self-pay | Admitting: Internal Medicine

## 2016-12-12 MED ORDER — WARFARIN SODIUM 5 MG PO TABS: ORAL_TABLET | ORAL | 0 refills | Status: DC

## 2016-12-19 ENCOUNTER — Emergency Department (HOSPITAL_COMMUNITY): Payer: Medicaid Other

## 2016-12-19 ENCOUNTER — Encounter (HOSPITAL_COMMUNITY): Payer: Self-pay | Admitting: Emergency Medicine

## 2016-12-19 ENCOUNTER — Emergency Department (HOSPITAL_COMMUNITY)
Admission: EM | Admit: 2016-12-19 | Discharge: 2016-12-19 | Disposition: A | Discharge status: Home / Self Care | Payer: Medicaid Other | Attending: Emergency Medicine | Admitting: Emergency Medicine

## 2016-12-19 DIAGNOSIS — Z5181 Encounter for therapeutic drug level monitoring: Secondary | ICD-10-CM

## 2016-12-19 DIAGNOSIS — R202 Paresthesia of skin: Secondary | ICD-10-CM | POA: Insufficient documentation

## 2016-12-19 DIAGNOSIS — R0602 Shortness of breath: Secondary | ICD-10-CM | POA: Insufficient documentation

## 2016-12-19 DIAGNOSIS — Z87891 Personal history of nicotine dependence: Secondary | ICD-10-CM | POA: Insufficient documentation

## 2016-12-19 DIAGNOSIS — I10 Essential (primary) hypertension: Secondary | ICD-10-CM | POA: Insufficient documentation

## 2016-12-19 DIAGNOSIS — Z7982 Long term (current) use of aspirin: Secondary | ICD-10-CM | POA: Insufficient documentation

## 2016-12-19 DIAGNOSIS — R791 Abnormal coagulation profile: Secondary | ICD-10-CM | POA: Insufficient documentation

## 2016-12-19 DIAGNOSIS — J45909 Unspecified asthma, uncomplicated: Secondary | ICD-10-CM | POA: Insufficient documentation

## 2016-12-19 DIAGNOSIS — Z8673 Personal history of transient ischemic attack (TIA), and cerebral infarction without residual deficits: Secondary | ICD-10-CM | POA: Insufficient documentation

## 2016-12-19 DIAGNOSIS — Z7901 Long term (current) use of anticoagulants: Secondary | ICD-10-CM | POA: Insufficient documentation

## 2016-12-19 HISTORY — DX: Cerebral infarction, unspecified: I63.9

## 2016-12-19 LAB — I-STAT CHEM 8, ED
BUN: 14 mg/dL (ref 6–20)
CALCIUM ION: 1.07 mmol/L — AB (ref 1.15–1.40)
CHLORIDE: 100 mmol/L — AB (ref 101–111)
Creatinine, Ser: 1.6 mg/dL — ABNORMAL HIGH (ref 0.44–1.00)
Glucose, Bld: 76 mg/dL (ref 65–99)
HCT: 45 % (ref 36.0–46.0)
Hemoglobin: 15.3 g/dL — ABNORMAL HIGH (ref 12.0–15.0)
Potassium: 4.6 mmol/L (ref 3.5–5.1)
SODIUM: 129 mmol/L — AB (ref 135–145)
TCO2: 18 mmol/L (ref 0–100)

## 2016-12-19 LAB — PROTIME-INR
INR: 2.01
PROTHROMBIN TIME: 23.1 s — AB (ref 11.4–15.2)

## 2016-12-19 LAB — DIFFERENTIAL
BASOS PCT: 0 %
Basophils Absolute: 0 10*3/uL (ref 0.0–0.1)
EOS ABS: 0.2 10*3/uL (ref 0.0–0.7)
EOS PCT: 2 %
Lymphocytes Relative: 11 %
Lymphs Abs: 1.2 10*3/uL (ref 0.7–4.0)
MONO ABS: 0.7 10*3/uL (ref 0.1–1.0)
Monocytes Relative: 7 %
NEUTROS PCT: 80 %
Neutro Abs: 8.2 10*3/uL — ABNORMAL HIGH (ref 1.7–7.7)

## 2016-12-19 LAB — COMPREHENSIVE METABOLIC PANEL
ALT: 16 U/L (ref 14–54)
ANION GAP: 11 (ref 5–15)
AST: 35 U/L (ref 15–41)
Albumin: 3.7 g/dL (ref 3.5–5.0)
Alkaline Phosphatase: 122 U/L (ref 38–126)
BUN: 13 mg/dL (ref 6–20)
CHLORIDE: 100 mmol/L — AB (ref 101–111)
CO2: 19 mmol/L — AB (ref 22–32)
Calcium: 9.4 mg/dL (ref 8.9–10.3)
Creatinine, Ser: 1.63 mg/dL — ABNORMAL HIGH (ref 0.44–1.00)
GFR calc non Af Amer: 35 mL/min — ABNORMAL LOW (ref >60)
GFR, EST AFRICAN AMERICAN: 40 mL/min — AB (ref >60)
GLUCOSE: 75 mg/dL (ref 65–99)
POTASSIUM: 4.4 mmol/L (ref 3.5–5.1)
SODIUM: 130 mmol/L — AB (ref 135–145)
Total Bilirubin: 1 mg/dL (ref 0.3–1.2)
Total Protein: 7.5 g/dL (ref 6.5–8.1)

## 2016-12-19 LAB — CBC
HCT: 39.1 % (ref 36.0–46.0)
Hemoglobin: 14 g/dL (ref 12.0–15.0)
MCH: 35 pg — AB (ref 26.0–34.0)
MCHC: 35.8 g/dL (ref 30.0–36.0)
MCV: 97.8 fL (ref 78.0–100.0)
PLATELETS: 391 10*3/uL (ref 150–400)
RBC: 4 MIL/uL (ref 3.87–5.11)
RDW: 13.3 % (ref 11.5–15.5)
WBC: 10.2 10*3/uL (ref 4.0–10.5)

## 2016-12-19 LAB — I-STAT TROPONIN, ED: Troponin i, poc: 0 ng/mL (ref 0.00–0.08)

## 2016-12-19 LAB — APTT: aPTT: 40 seconds — ABNORMAL HIGH (ref 24–36)

## 2016-12-19 LAB — D-DIMER, QUANTITATIVE: D-Dimer, Quant: 0.55 ug/mL-FEU — ABNORMAL HIGH (ref 0.00–0.50)

## 2016-12-19 LAB — CBG MONITORING, ED: Glucose-Capillary: 79 mg/dL (ref 65–99)

## 2016-12-19 MED ORDER — SODIUM CHLORIDE 0.9 % IV BOLUS (SEPSIS)
1000.0000 mL | Freq: Once | INTRAVENOUS | Status: AC
  Administered 2016-12-19: 1000 mL via INTRAVENOUS

## 2016-12-19 MED ORDER — ENOXAPARIN SODIUM 60 MG/0.6ML ~~LOC~~ SOLN: 1.0000 mg/kg | Freq: Two times a day (BID) | SUBCUTANEOUS | 0 refills | Status: DC

## 2016-12-19 MED ORDER — IOPAMIDOL (ISOVUE-370) INJECTION 76%
INTRAVENOUS | Status: AC
  Administered 2016-12-19: 60 mL
  Filled 2016-12-19: qty 100

## 2016-12-19 MED ORDER — ENOXAPARIN SODIUM 80 MG/0.8ML ~~LOC~~ SOLN
1.0000 mg/kg | Freq: Once | SUBCUTANEOUS | Status: AC
  Administered 2016-12-19: 65 mg via SUBCUTANEOUS
  Filled 2016-12-19: qty 0.8

## 2016-12-19 MED ORDER — ENOXAPARIN SODIUM 80 MG/0.8ML ~~LOC~~ SOLN
1.0000 mg/kg | Freq: Two times a day (BID) | SUBCUTANEOUS | Status: DC
  Filled 2016-12-19: qty 0.8

## 2016-12-19 NOTE — ED Notes (Signed)
Marzetta Board, RN attempted blood draw x2 without success.

## 2016-12-19 NOTE — ED Triage Notes (Addendum)
Per EMS pt at social services with episode of anxiety with associated dizziness. With triage pt verbalizes left hand tingling and dizziness onset 1130 today; unsure if anxiety related. Pt hx of stroke with left side deficits; pt denies change today. Pt sensation and strength less on left side with minimal left facial droop; consistent with previous stroke per pt.

## 2016-12-19 NOTE — ED Notes (Signed)
Patient transported to MRI 

## 2016-12-19 NOTE — Discharge Instructions (Signed)
Please read attached information. If you experience any new or worsening signs or symptoms please return to the emergency room for evaluation. Please use medication prescribed only as directed and discontinue taking if you have any concerning signs or symptoms.  Please follow-up with the Coumadin clinic tomorrow morning as previously scheduled.  Please inform them of today's visit and all relevant data.  Please use medication as directed.

## 2016-12-19 NOTE — Progress Notes (Addendum)
CSW asked by pt's RN to consult with pt for pt needs.  Pt stated she needs tranportation. Pt stated she cannot ride a bus.  CSW will continue to follow for discharge needs.  CSW noted pt had HX of stroke and "fell" due to anxiety earlier in the day and provided pt with a taxi voucher.  Pt demonstrated sever anxiety at news of a bus pass being provided.  Alphonse Guild. Ainsley Sanguinetti, Latanya Presser, LCAS Clinical Social Worker Ph: [phone]

## 2016-12-19 NOTE — ED Notes (Signed)
Patient transported to CT 

## 2016-12-19 NOTE — ED Notes (Signed)
Per [facility], stated the reason for ordering the scan is not appropriate-states a CT scan is a better diagnostic test-states they do not have the staff and it will be at least 2 hours before they can get to her-PA notified

## 2016-12-19 NOTE — ED Notes (Signed)
ED Provider at bedside. 

## 2016-12-19 NOTE — ED Provider Notes (Signed)
[facility] DEPT Provider Note   CSN: [id number] Arrival date & time: 12/19/16  1252     History   Chief Complaint Chief Complaint  Patient presents with  . Anxiety  . Dizziness  . Tingling    HPI Mary [name] is a 56 y.o. female.  HPI   56 year old female with history of mechanical mitral valve repair and TV repair August 2014 on Coumadin, hypertension, GERD, and history of TIA in the setting of subtherapeutic INR presents today with complaints of dizziness, shortness of breath, and tingling in her left upper extremity.  Patient notes that yesterday she developed shortness of breath, dizziness.  She describes the dizziness as a lightheaded sensation and the feeling that she may pass out.  Patient denies any worsening symptoms with movement.  She notes the symptoms continue to persist, and at approximately 9:30 AM she developed tingling in the left upper extremity.  She notes previous left-sided sensory and strength deficits including face upper and lower extremity.  She notes these have remained at the baseline.  Patient denies any upper respiratory complaints including congestion, cough, fever or chills.  Denies any lower extremity swelling or edema.  Patient notes that she has not been taking her medications as directed, reports her last dose of medication was several days ago as she cannot afford her medications.  She denies any associated chest pain.    Past Medical History:  Diagnosis Date  . Acute diastolic heart failure ([place]) 03/15/2013  . Anemia   . Asthma    "as a baby"   . Carotid stenosis    Carotid US 2/17: bilateral ICA 1-39% >> FU prn  . Complete heart block ([place]) 05/28/2013   Post-op  . Epigastric hernia 200's  . GERD (gastroesophageal reflux disease)   . History of blood transfusion    "14 w/1st pregnancy; 2 w/last C-section" (03/15/2013)  . Hx of echocardiogram    a. Echo 4/16:  Mild focal basal septal hypertrophy, EF 60-65%, no RWMA, Mechanical MVR ok with  mild central regurgitation and no perivalvular leak, small mobile density attached to valvular ring (1.5x1 cm) - post surgical changes vs SBE, mild LAE;   b. Echo 2/17:  Mild post wall LVH, EF 55-60%, no RWMA, mechanical MVR ok (mean 5 mmHg), normal RVSF  . Hypertension    no pcp   will go to [facility]  . 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 S/P mitral valve replacement with metallic valve 01/02/8118   45mm Sorin Carbomedics mechanical prosthesis via right mini thoracotomy approach  . 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 Severe mitral regurgitation 04/22/2013  . Stroke ([place])   . SVT (supraventricular tachycardia) ([place]) 03/16/2013  . Tricuspid regurgitation     Patient Active Problem List   Diagnosis Date Noted  . Chest pain 06/05/2016  . AKI (acute kidney injury) ([place]) 01/10/2016  . Subtherapeutic international normalized ratio (INR) 01/10/2016  . TIA (transient ischemic attack) 09/13/2015  . History of rheumatic heart disease 09/13/2015  . CVA (cerebral vascular accident) ([place]) 09/12/2015  . First degree heart block 06/17/2013  . Weakness 06/16/2013  . (HFpEF) heart failure with preserved ejection fraction ([place]) 06/16/2013  . Anemia 06/16/2013  . History of bacterial endocarditis 06/16/2013  . Heart valve replaced by other means 06/10/2013  . Long term (current) use of anticoagulants 06/10/2013  .  Complete heart block ([place]) 05/28/2013  . S/P minimally-invasive mitral valve replacement with mechanical valve and tricuspid valve repair 05/23/2013  . S/P minimally-invasive tricuspid valve repair 05/23/2013  . Femoral hernia 05/23/2013  . Severe mitral regurgitation 04/22/2013  . Mitral valve disorders(424.0) 04/22/2013  . Tricuspid regurgitation 04/22/2013  . Rheumatic mitral stenosis with  regurgitation 03/23/2013  . SVT (supraventricular tachycardia) ([place]) 03/16/2013    Past Surgical History:  Procedure Laterality Date  . APPENDECTOMY  [id number]  . CARDIAC CATHETERIZATION    . [place]; 1999  . CESAREAN SECTION WITH BILATERAL TUBAL LIGATION  1999  . FEMORAL HERNIA REPAIR Right 05/23/2013   Procedure: HERNIA REPAIR FEMORAL;  Surgeon: Rexene Alberts, MD;  Location: [facility];  Service: Open Heart Surgery;  Laterality: Right;  . INTRAOPERATIVE TRANSESOPHAGEAL ECHOCARDIOGRAM N/A 05/23/2013   Procedure: INTRAOPERATIVE TRANSESOPHAGEAL ECHOCARDIOGRAM;  Surgeon: Rexene Alberts, MD;  Location: [facility];  Service: Open Heart Surgery;  Laterality: N/A;  . LAPAROSCOPIC CHOLECYSTECTOMY  2003  . LEFT AND RIGHT HEART CATHETERIZATION WITH CORONARY ANGIOGRAM N/A 03/22/2013   Procedure: LEFT AND RIGHT HEART CATHETERIZATION WITH CORONARY ANGIOGRAM;  Surgeon: Burnell Blanks, MD;  Location: [facility] CATH LAB;  Service: Cardiovascular;  Laterality: N/A;  . MINIMALLY INVASIVE TRICUSPID VALVE REPAIR Right 05/23/2013   Procedure: MINIMALLY INVASIVE TRICUSPID VALVE REPAIR;  Surgeon: Rexene Alberts, MD;  Location: [facility];  Service: Open Heart Surgery;  Laterality: Right;  . MITRAL VALVE REPLACEMENT N/A 05/23/2013   Procedure: MITRAL VALVE (MV) REPLACEMENT;  Surgeon: Rexene Alberts, MD;  Location: [facility];  Service: Open Heart Surgery;  Laterality: N/A;  . MULTIPLE EXTRACTIONS WITH ALVEOLOPLASTY N/A 04/04/2013   Procedure: Extraction of tooth #'s 1,8,9,13,14,15,23,24,25,26 with alveoloplasty and gross debridement of remaining teeth;  Surgeon: Lenn Cal, DDS;  Location: [place];  Service: Oral Surgery;  Laterality: N/A;  . TEE WITHOUT CARDIOVERSION N/A 03/18/2013   Procedure: TRANSESOPHAGEAL ECHOCARDIOGRAM (TEE);  Surgeon: Larey Dresser, MD;  Location: [facility];  Service: Cardiovascular;  Laterality: N/A;  . TEE WITHOUT CARDIOVERSION N/A 06/17/2013   Procedure: TRANSESOPHAGEAL ECHOCARDIOGRAM  (TEE);  Surgeon: Lelon Perla, MD;  Location[facility] ENDOSCOPY;  Service: Cardiovascular;  Laterality: N/A;    OB History    No data available       Home Medications    Prior to Admission medications   Medication Sig Start Date End Date Taking? Authorizing Provider  aspirin EC 81 MG tablet Take 1 tablet (81 mg total) by mouth daily. 08/26/13  Yes Larey Dresser, MD  warfarin (COUMADIN) 5 MG tablet Take as directed by coumadin clinic Patient taking differently: Take 5-7.5 mg by mouth See admin instructions. Take as directed by coumadin clinic. Take 5  mg on Tuesday and Thursday.  Take 7.5 mg Monday, Wednesday, Friday, Saturday, and Sunday. 12/12/16  Yes Evans Lance, MD  [facility] HAZEL EX Apply 1 application topically at bedtime as needed (knee pain).    Yes Historical Provider, MD  enoxaparin (LOVENOX) 60 MG/0.6ML injection Inject 0.65 mLs (65 mg total) into the skin every 12 (twelve) hours. 12/19/16   Okey Regal, PA-C  lovastatin (ALTOPREV) 20 MG 24 hr tablet Take 1 tablet (20 mg total) by mouth at bedtime. Patient not taking: Reported on 11/05/2016 06/22/16   Evans Lance, MD  traMADol (ULTRAM) 50 MG tablet Take 1 tablet (50 mg total) by mouth every 6 (six) hours as needed. Patient not taking: Reported on 12/19/2016 11/05/16   Okey Regal,  PA-C    Family History Family History  Problem Relation Age of Onset  . Cancer Mother   . Stroke Father   . Sarcoidosis Sister   . Heart attack Neg Hx     Social History Social History  Substance Use Topics  . Smoking status: Former Smoker    Packs/day: 0.50    Years: 36.00    Types: Cigarettes    Quit date: 03/15/2013  . Smokeless tobacco: Never Used  . Alcohol use 3.6 oz/week    6 Glasses of wine per week     Comment: 03/15/2013 "bottle of wine/wk"     Allergies   Oxycodone   Review of Systems Review of Systems  All other systems reviewed and are negative.   Physical Exam Updated Vital Signs BP (!) 134/94 (BP Location:  Right Arm)   Pulse (!) 105   Temp 98.6 F (37 C) (Oral)   Resp 18   Ht 5\' 2"  (1.575 m)   Wt 63 kg   LMP 03/22/2013   SpO2 99%   BMI 25.42 kg/m   Physical Exam  Constitutional: She is oriented to person, place, and time. She appears well-developed and well-nourished.  HENT:  Head: Normocephalic and atraumatic.  Eyes: Conjunctivae are normal. Pupils are equal, round, and reactive to light. Right eye exhibits no discharge. Left eye exhibits no discharge. No scleral icterus.  Neck: Normal range of motion. No JVD present. No tracheal deviation present.  Cardiovascular:  Tachycardia  Pulmonary/Chest: Effort normal and breath sounds normal. No stridor. No respiratory distress. She has no wheezes. She has no rales. She exhibits no tenderness.  Neurological: She is alert and oriented to person, place, and time. Coordination normal.  Patient alert and oriented 3, following commands, extraocular movements are intact, visual fields intact, slight left-sided facial asymmetry that seems to be overcome with effort, left upper and lower extremity strength decreased compared to the left; difficulty against gravity; no signs of pronator drift, minor ataxia with the left upper extremity, decreased sensation to the left face upper and lower extremity, no dysphasia, dysarthria (strength and sensory deficits are baseline per patient)   Psychiatric: She has a normal mood and affect. Her behavior is normal. Judgment and thought content normal.  Nursing note and vitals reviewed.   ED Treatments / Results  Labs (all labs ordered are listed, but only abnormal results are displayed) Labs Reviewed  PROTIME-INR - Abnormal; Notable for the following:       Result Value   Prothrombin Time 23.1 (*)    All other components within normal limits  APTT - Abnormal; Notable for the following:    aPTT 40 (*)    All other components within normal limits  CBC - Abnormal; Notable for the following:    MCH 35.0 (*)     All other components within normal limits  DIFFERENTIAL - Abnormal; Notable for the following:    Neutro Abs 8.2 (*)    All other components within normal limits  COMPREHENSIVE METABOLIC PANEL - Abnormal; Notable for the following:    Sodium 130 (*)    Chloride 100 (*)    CO2 19 (*)    Creatinine, Ser 1.63 (*)    GFR calc non Af Amer 35 (*)    GFR calc Af Amer 40 (*)    All other components within normal limits  D-DIMER, QUANTITATIVE (NOT AT [facility]) - Abnormal; Notable for the following:    D-Dimer, Quant 0.55 (*)    All  other components within normal limits  I-STAT CHEM 8, ED - Abnormal; Notable for the following:    Sodium 129 (*)    Chloride 100 (*)    Creatinine, Ser 1.60 (*)    Calcium, Ion 1.07 (*)    Hemoglobin 15.3 (*)    All other components within normal limits  I-STAT TROPOININ, ED  CBG MONITORING, ED    EKG  EKG Interpretation None       Radiology Dg Chest 2 View  Result Date: 12/19/2016 CLINICAL DATA:  Anxiety and dizziness EXAM: CHEST  2 VIEW COMPARISON:  11/05/2016 FINDINGS: Cardiac shadow is stable. Postoperative changes are again seen. Lungs are well-aerated without focal infiltrate or sizable effusion. No acute bony abnormality is seen. IMPRESSION: No active cardiopulmonary disease. Electronically Signed   By: Inez Catalina M.D.   On: 12/19/2016 15:10   Ct Head Wo Contrast  Result Date: 12/19/2016 CLINICAL DATA:  Dizziness EXAM: CT HEAD WITHOUT CONTRAST TECHNIQUE: Contiguous axial images were obtained from the base of the skull through the vertex without intravenous contrast. COMPARISON:  11/05/2016 FINDINGS: Brain: No evidence of acute infarction, hemorrhage, hydrocephalus, extra-axial collection or mass lesion/mass effect. Vascular: No hyperdense vessel or unexpected calcification. Skull: Normal. Negative for fracture or focal lesion. Sinuses/Orbits: The visualized paranasal sinuses are essentially clear. The mastoid air cells are unopacified. Other: None.  IMPRESSION: No evidence of acute intracranial abnormality. Electronically Signed   By: Julian Hy M.D.   On: 12/19/2016 14:40   Ct Angio Chest Pe W And/or Wo Contrast  Result Date: 12/19/2016 CLINICAL DATA:  Dyspnea and chest pain radiating to the left arm. EXAM: CT ANGIOGRAPHY CHEST WITH CONTRAST TECHNIQUE: Multidetector CT imaging of the chest was performed using the standard protocol during bolus administration of intravenous contrast. Multiplanar CT image reconstructions and MIPs were obtained to evaluate the vascular anatomy. CONTRAST:  60 cc Isovue 370 IV COMPARISON:  CXR from 12/19/2016 and 11/05/2016 FINDINGS: Cardiovascular: Normal sized cardiac chambers. Status post mitral and tricuspid valvular repair. No pericardial effusion. No aortic aneurysm or dissection. No acute pulmonary embolus. Normal branch pattern of the great vessels. Mediastinum/Nodes: No enlarged mediastinal, hilar, or axillary lymph nodes. Thyroid gland, trachea, and esophagus demonstrate no significant findings. Small hiatal hernia. Lungs/Pleura: Scarring or linear atelectasis in the right middle lobe. Minimal left lower lobe bronchiectasis. No dominant mass, pulmonary consolidation, effusion or pneumothorax. Upper Abdomen: No acute abnormality. Musculoskeletal: No chest wall abnormality. No acute or significant osseous findings. Review of the MIP images confirms the above findings. IMPRESSION: 1. Status post mitral and tricuspid valvular repair. 2. No acute pulmonary embolus, aortic dissection or aneurysm. 3. Scarring or linear atelectasis in the right middle lobe with minimal left lower lobe bronchiectasis. Electronically Signed   By: Ashley Royalty M.D.   On: 12/19/2016 19:38   Mr Brain Wo Contrast  Result Date: 12/19/2016 CLINICAL DATA:  Left upper extremity tingling. EXAM: MRI HEAD WITHOUT CONTRAST TECHNIQUE: Multiplanar, multiecho pulse sequences of the brain and surrounding structures were obtained without intravenous  contrast. COMPARISON:  06/06/2016 FINDINGS: Brain: No acute infarction, hemorrhage, hydrocephalus, extra-axial collection or mass lesion. Stable vermian volume loss. Chronic lacune in the left pons. Small focus of remote hemorrhage or calcification in the parasagittal left frontal lobe, nonspecific in isolation and chronic. Vascular: Preserved flow voids Skull and upper cervical spine: Negative Sinuses/Orbits: Negative IMPRESSION: No acute finding or change from 2017. Electronically Signed   By: Monte Fantasia M.D.   On: 12/19/2016 18:39  Procedures Procedures (including critical care time)  Medications Ordered in ED Medications  enoxaparin (LOVENOX) injection 65 mg (not administered)  sodium chloride 0.9 % bolus 1,000 mL (0 mLs Intravenous Stopped 12/19/16 1624)  sodium chloride 0.9 % bolus 1,000 mL (0 mLs Intravenous Stopped 12/19/16 2117)  iopamidol (ISOVUE-370) 76 % injection (60 mLs  Contrast Given 12/19/16 1907)  enoxaparin (LOVENOX) injection 65 mg (65 mg Subcutaneous Given 12/19/16 2054)    Initial Impression / Assessment and Plan / ED Course  I have reviewed the triage vital signs and the nursing notes.  Pertinent labs & imaging results that were available during my care of the patient were reviewed by me and considered in my medical decision making (see chart for details).      Final Clinical Impressions(s) / ED Diagnoses   Final diagnoses:  SOB (shortness of breath)  Paresthesia  Subtherapeutic anticoagulation    Labs: INR 2.01, creatinine 1.60, d-dimer 0.55  Imaging: CT head without, MRI without, CT angiogram chest PE  Consults:  Therapeutics: Lovenox, normal saline  Discharge Meds:   Assessment/Plan:   56 year old female with a history of stroke, MR mechanical valve replacement chronically on Coumadin and noncompliant with subtherapeutic INR presents today with complaints of dizziness, shortness of breath, and left upper extremity tingling.  Patient does have  neurological deficits on the left side, these are unchanged from her prior stroke.  She notes shortness of breath at baseline, slightly worsened today.  Her initial presentation did not appear to be concerning for acute stroke, head CT was negative, MR brain without was ordered as well.  Patient's d-dimer was elevated, staff unable to get large enough catheter for CT PE, VQ scan ordered.  Prior to VQ scan patient reports that she is back at her baseline.  She has no more tingling in the left upper extremity, her neuro deficits are at baseline.  She is not having any shortness of breath over her normal.  Due to persistent tachycardia and initial complaints V/Q scan will be completed here.  She does have a slight bump in her creatinine, she was given normal saline here.  Patient is noncompliant with Coumadin as she reports she has been unable to afford it.  Pending VQ scan results patient will likely be given a dose of Lovenox via pharmacy consultation, she does have a follow-up appointment at the Coumadin clinic tomorrow.  If her MRI is negative, her VQ scan does not show any significant findings patient will be stable for discharge home with Coumadin clinic follow-up and return precautions.  Patient is happy with today's plan, she feels she is completely back to her baseline with no concerning features.  Patient also notes that her heart rate is always elevated, chart review shows this is true and her most recent visit she was tachycardic with other nonrelated complaints.   Nursing staff able to get a 20-gauge catheter.  CT PE study was done with no acute pulmonary embolus and.  Patient is back to her baseline with no acute concerns.  She was given Lovenox injection for coverage for the next 12 hours.  Patient does have a follow-up appointment tomorrow morning at the Coumadin clinic.    Patient will follow up tomorrow morning, she verbalized her understanding and agreement to today's plan.  And in no further  questions or concerns at time discharge    New Prescriptions Discharge Medication List as of 12/19/2016  9:49 PM    START taking these medications   Details  enoxaparin (LOVENOX) 60 MG/0.6ML injection Inject 0.65 mLs (65 mg total) into the skin every 12 (twelve) hours., Starting Mon 12/19/2016, Print         Okey Regal, PA-C 12/19/16 [place], MD 12/20/16 [phone]

## 2016-12-19 NOTE — ED Notes (Signed)
Informed MD with regards to not being able to place 20 g IV for diagnostic test-CT refused to place IV for testing purposes-IV team consulted

## 2016-12-19 NOTE — ED Notes (Signed)
Zenia Resides MD aware of pt status and complaint.

## 2016-12-19 NOTE — Progress Notes (Signed)
ANTICOAGULATION CONSULT NOTE - Initial Consult  Pharmacy Consult for Lovenox Indication: Mechanical Mitral Valve Replacement  Allergies  Allergen Reactions  . Oxycodone Itching    Patient Measurements: Height: 5\' 2"  (157.5 cm) Weight: 139 lb (63 kg) IBW/kg (Calculated) : 50.1   Vital Signs: Temp: 98 F (36.7 C) (03/26 1323) Temp Source: Oral (03/26 1300) BP: 125/85 (03/26 1935) Pulse Rate: 100 (03/26 1935)  Labs:  Recent Labs  12/19/16 1350 12/19/16 1355  HGB 15.3* 14.0  HCT 45.0 39.1  PLT  --  391  APTT  --  40*  LABPROT  --  23.1*  INR  --  2.01  CREATININE 1.60* 1.63*    Estimated Creatinine Clearance: 34.4 mL/min (A) (by C-G formula based on SCr of 1.63 mg/dL (H)).   Medical History: Past Medical History:  Diagnosis Date  . Acute diastolic heart failure ([place]) 03/15/2013  . Anemia   . Asthma    "as a baby"   . Carotid stenosis    Carotid US 2/17: bilateral ICA 1-39% >> FU prn  . Complete heart block ([place]) 05/28/2013   Post-op  . Epigastric hernia 200's  . GERD (gastroesophageal reflux disease)   . History of blood transfusion    "14 w/1st pregnancy; 2 w/last C-section" (03/15/2013)  . Hx of echocardiogram    a. Echo 4/16:  Mild focal basal septal hypertrophy, EF 60-65%, no RWMA, Mechanical MVR ok with mild central regurgitation and no perivalvular leak, small mobile density attached to valvular ring (1.5x1 cm) - post surgical changes vs SBE, mild LAE;   b. Echo 2/17:  Mild post wall LVH, EF 55-60%, no RWMA, mechanical MVR ok (mean 5 mmHg), normal RVSF  . Hypertension    no pcp   will go to [facility]  . 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 Severe mitral regurgitation 04/22/2013  . Stroke ([place])   . SVT (supraventricular tachycardia) ([place]) 03/16/2013  . Tricuspid regurgitation     Assessment:  56 yr female presents with anxiety and dizziness.  PMH significant for CVA with left side deficits  Patient with mechanical MVR and on warfarin PTA   Warfarin regimen: 7.5mg  daily except 5mg  on Tues/Thur  Patient reports last dose was 3/24 (and only took 5mg )  INR = 2.01 on admission (subtherapeutic, as goal 2.5-3.5 for MVR)  Pharmacy consulted to dose Lovenox for anticoagulation treatment  Goal of Therapy:  Full anticoagulation   Plan:  Lovenox 1mg /kg (65mg ) sq q12h Follow renal function  Ebrahim Deremer, Toribio Harbour, PharmD 12/19/2016,8:05 PM

## 2016-12-20 ENCOUNTER — Ambulatory Visit (INDEPENDENT_AMBULATORY_CARE_PROVIDER_SITE_OTHER): Payer: Self-pay

## 2016-12-20 DIAGNOSIS — I059 Rheumatic mitral valve disease, unspecified: Secondary | ICD-10-CM

## 2016-12-20 DIAGNOSIS — Z7901 Long term (current) use of anticoagulants: Secondary | ICD-10-CM

## 2016-12-20 DIAGNOSIS — Z952 Presence of prosthetic heart valve: Secondary | ICD-10-CM

## 2016-12-20 LAB — POCT INR: INR: 2.3

## 2016-12-21 IMAGING — CR DG CHEST 1V PORT
1 series · 1 of 1 positions shown · non-contrast
Comparison: Radiographs 01/10/2016

CLINICAL DATA: Chest pain tonight.

EXAM:
PORTABLE CHEST 1 VIEW

[AP]
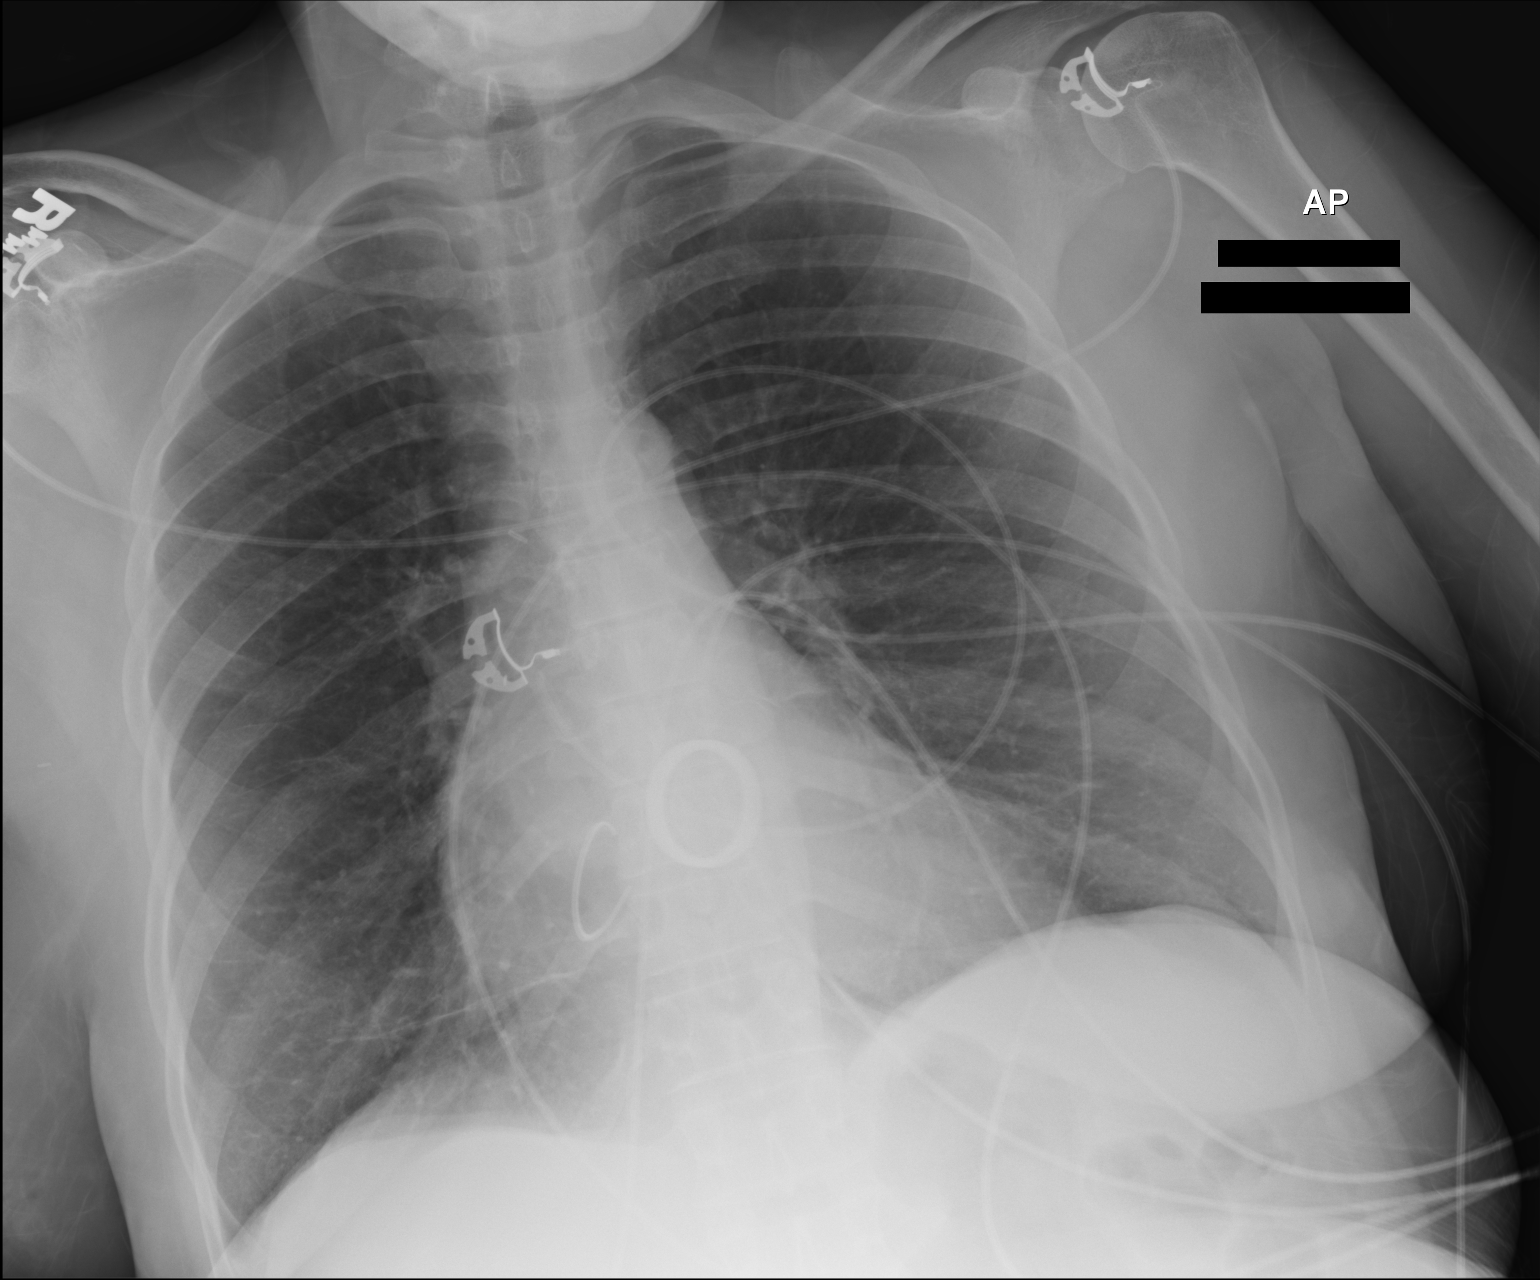

[1 of 1 positions shown; findings below may reference images not displayed]

FINDINGS: Heart size is normal. Mediastinal contours are unchanged. There are
2 prostatic cardiac valves. Surgical clips project over the right
hilum. Minimal atelectasis or scarring at the right lung base. No
focal airspace disease, pleural effusion, pulmonary edema or
pneumothorax. No acute osseous abnormality.
IMPRESSION: No active disease.

## 2016-12-21 IMAGING — MR MR MRA HEAD W/O CM
9 of 12 series · 30 of 48 positions shown · non-contrast
Comparison: Prior CT from earlier same day.

CLINICAL DATA: Initial evaluation for acute onset left-sided facial
numbness with left arm weakness.

EXAM:
MRI HEAD WITHOUT CONTRAST
MRA HEAD WITHOUT CONTRAST
TECHNIQUE: Multiplanar, multiecho pulse sequences of the brain and surrounding
structures were obtained without intravenous contrast. Angiographic
images of the head were obtained using MRA technique without
contrast.

[Series 3: T1 · sagittal · 5.0mm · 0.47mm/px · 1 of 25 slices shown]
[im 1/25]
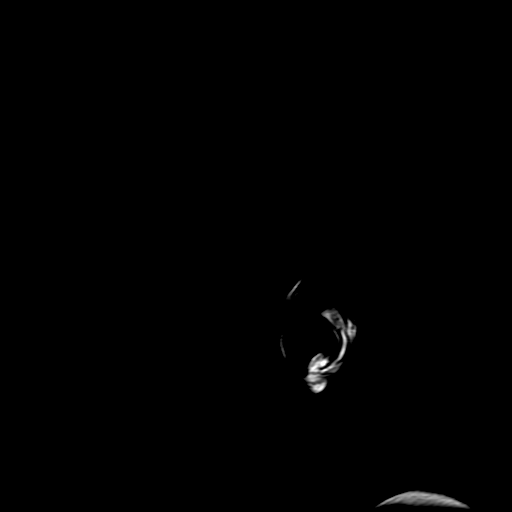

[Series 4: DWI · axial · 3.0mm · 1.09mm/px · z∈[-68,+73]mm · 7 of 102 slices shown (1 of 4)]
[im 1/102]
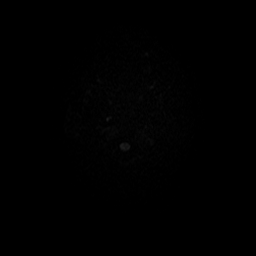
[im 17/102]
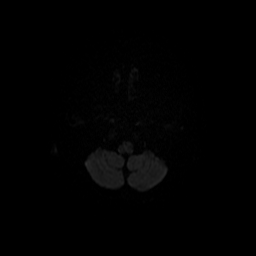
[im 34/102]
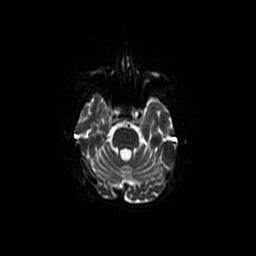
[im 51/102]
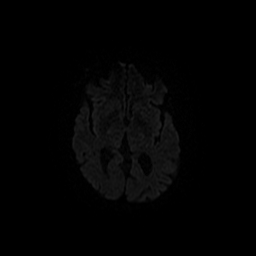
[im 68/102]
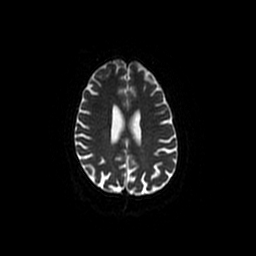
[im 85/102]
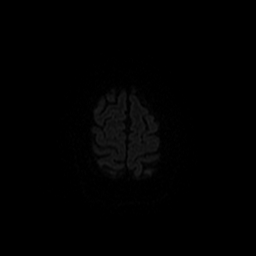
[im 102/102]
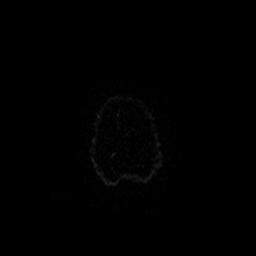

[Series 5: DWI · coronal · 5.0mm · 1.09mm/px · 5 of 66 slices shown (2 of 4)]
[im 1/66]
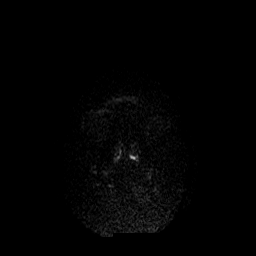
[im 17/66]
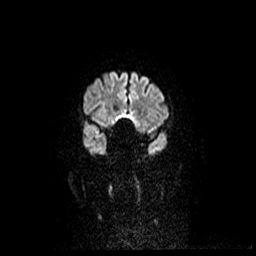
[im 33/66]
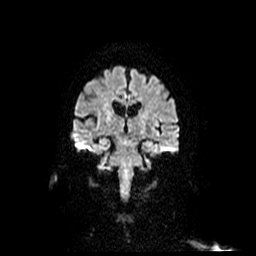
[im 49/66]
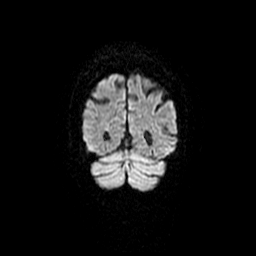
[im 66/66]
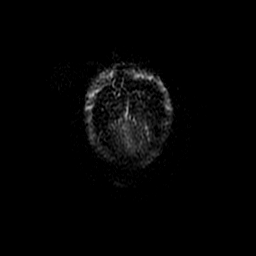

[Series 6: (id) mt fs · axial · 1.4mm · 0.43mm/px · z∈[-80,-27]mm · 5 of 156 slices shown]
[im 1/156]
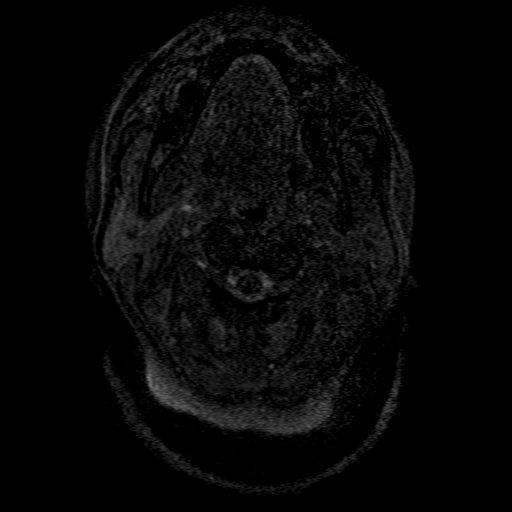
[im 29/156]
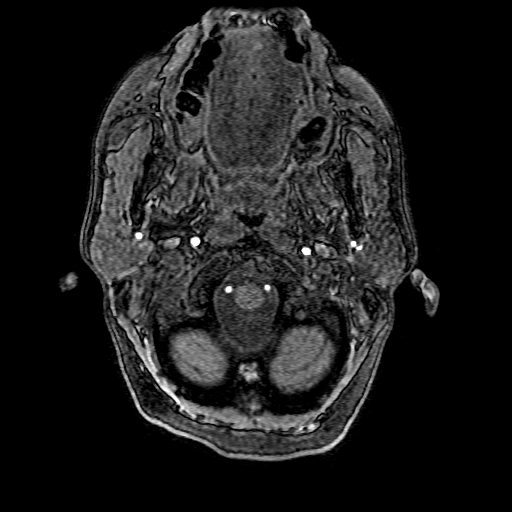
[im 43/156]
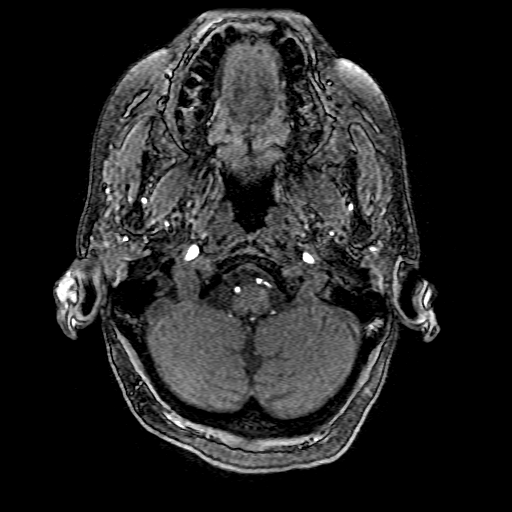
[im 71/156]
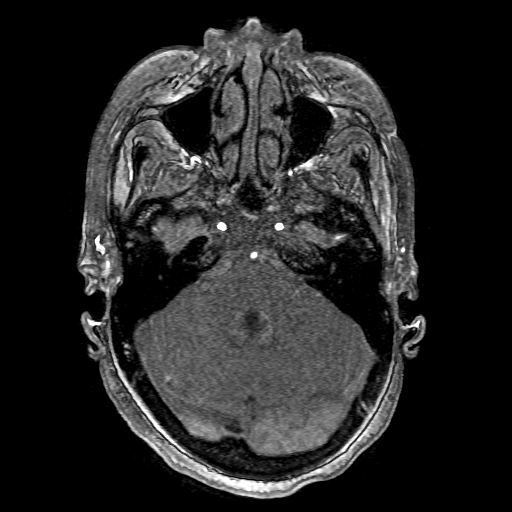
[im 85/156]
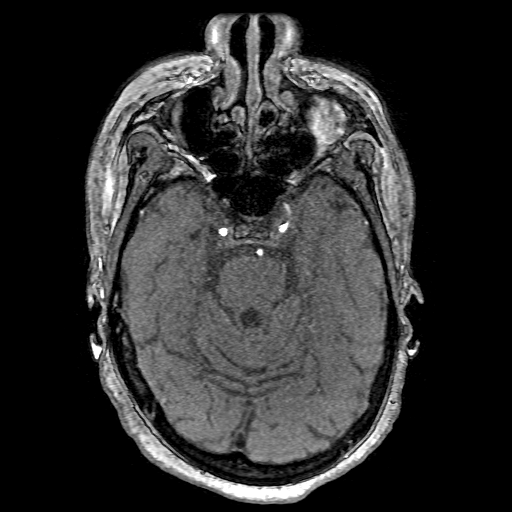

[Series 7: T2 · axial · 5.0mm · 0.43mm/px · z∈[-61,+81]mm · 2 of 26 slices shown (1 of 2)]
[im 1/26]
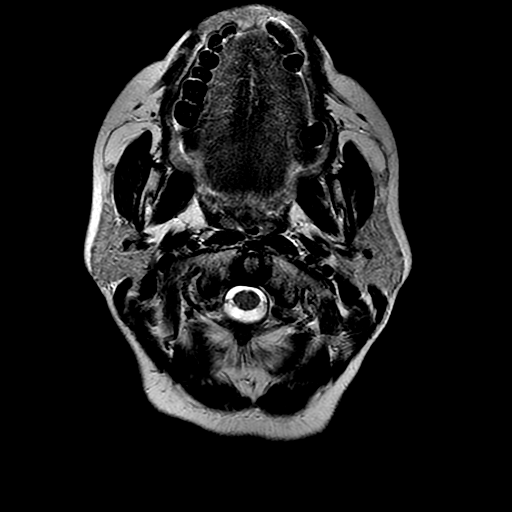
[im 26/26]
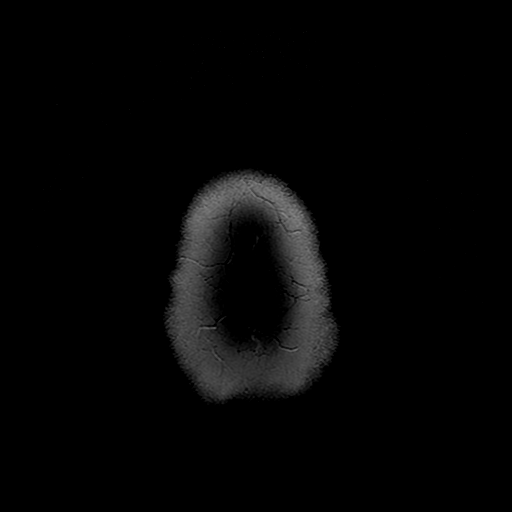

[Series 8: FLAIR · axial · 5.0mm · 0.43mm/px · z∈[-61,+81]mm · 2 of 26 slices shown]
[im 1/26]
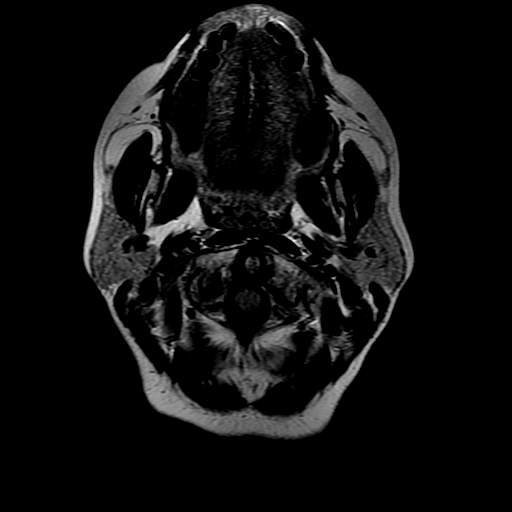
[im 26/26]
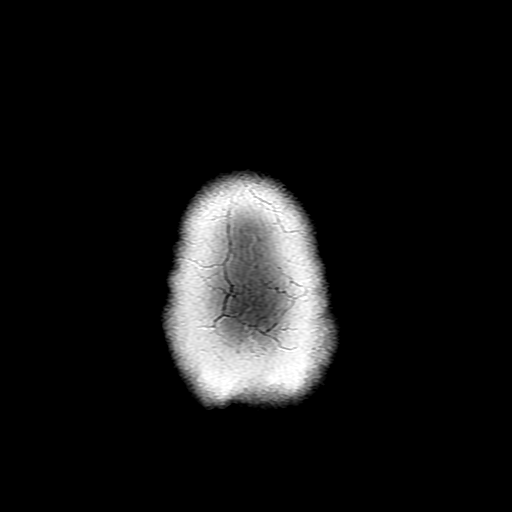

[Series 11: T2 · coronal · 5.0mm · 0.39mm/px · 2 of 25 slices shown (2 of 2)]
[im 1/25]
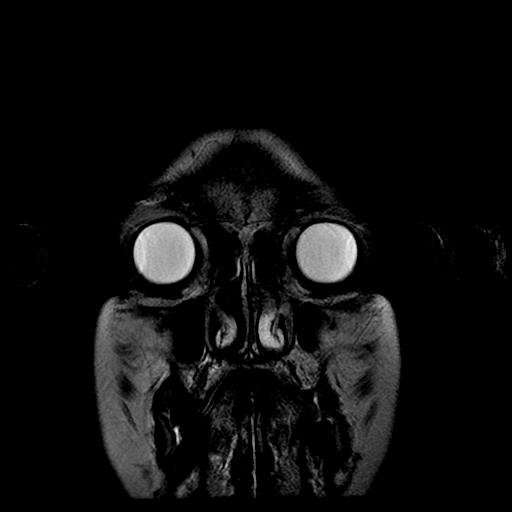
[im 25/25]
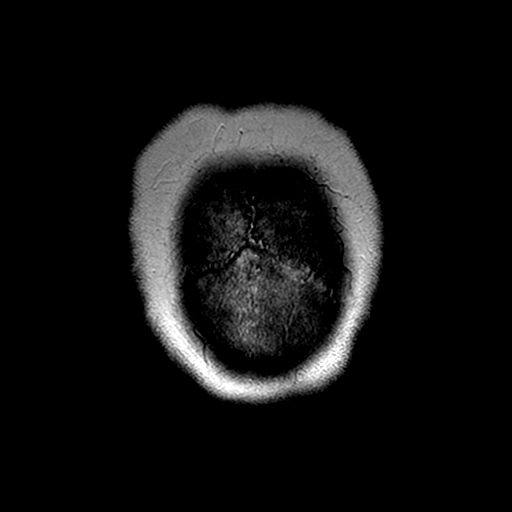

[Series 400: DWI · axial · 3.0mm · 1.09mm/px · z∈[-68,+73]mm · 4 of 51 slices shown (3 of 4)]
[im 1/51]
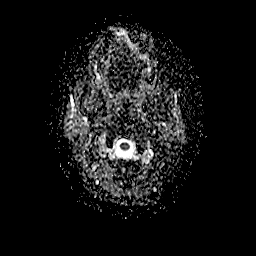
[im 17/51]
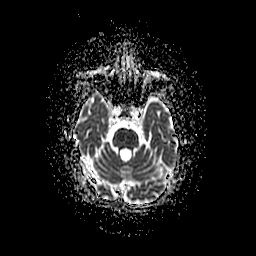
[im 34/51]
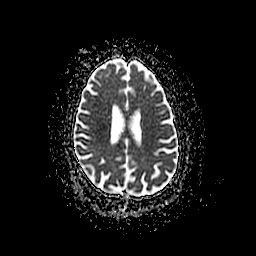
[im 51/51]
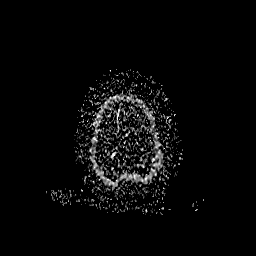

[Series 500: DWI · coronal · 5.0mm · 1.09mm/px · 2 of 33 slices shown (4 of 4)]
[im 1/33]
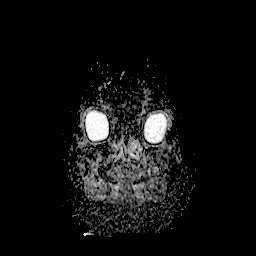
[im 33/33]
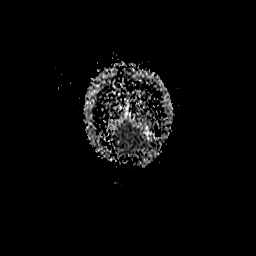

[30 of 48 positions shown; findings below may reference images not displayed]

FINDINGS: MRI HEAD FINDINGS

Mild generalized cerebral atrophy is stable from previous studies.
No significant cerebral white matter disease for patient age.

No abnormal foci of restricted diffusion to suggest acute to
subacute ischemia. Gray-white matter differentiation is well
maintained. Major intracranial vascular flow voids are preserved. No
evidence for acute or chronic intracranial hemorrhage. Two adjacent
sub cm T2 hyperintense lucencies within the left paramedian pons,
which may reflect small remote lacunar infarcts versus dilated
perivascular spaces. No other evidence for remote infarction.

No mass lesion, midline shift, or mass effect. No hydrocephalus. No
extra-axial fluid collection. Major dural sinuses are grossly
patent.

Craniocervical junction normal. Visualized upper cervical spine
unremarkable.

Pituitary gland within normal limits. Globes and orbits
unremarkable.

Mild scattered mucosal thickening within the ethmoidal air cells.
Paranasal sinuses are otherwise clear. No mastoid effusion. Inner
ear structures grossly normal.

Bone marrow signal intensity within normal limits. No scalp soft
tissue abnormality.

MRA HEAD FINDINGS

ANTERIOR CIRCULATION:

Visualized distal cervical segments of the internal carotid arteries
widely patent with antegrade flow. Petrous, cavernous, and
supraclinoid segments widely patent. A1 segments, anterior
communicating artery common anterior cerebral arteries well
opacified. M1 segments widely patent without stenosis or occlusion.
Distal MCA branches well opacified and symmetric.

POSTERIOR CIRCULATION:

Vertebral arteries patent to the vertebrobasilar junction. Posterior
inferior cerebral arteries patent proximally. Basilar artery widely
patent to its distal aspect. Superior cerebral arteries patent
bilaterally. Left PCA arises from the basilar artery and is well
opacified to its distal aspect. Fetal type right PCA supplied via a
a robust right posterior communicating artery. Right PCA also well
opacified to its distal aspect.

No aneurysm or vascular malformation.
IMPRESSION: MRI HEAD IMPRESSION:

1. No acute intracranial infarct or other process identified.
2. Mild generalized cerebral atrophy, stable.

MRA HEAD IMPRESSION:

Normal intracranial MRA.

## 2016-12-26 ENCOUNTER — Encounter: Payer: Self-pay | Admitting: Physician Assistant

## 2016-12-27 ENCOUNTER — Ambulatory Visit (INDEPENDENT_AMBULATORY_CARE_PROVIDER_SITE_OTHER): Payer: Self-pay | Admitting: Pharmacist

## 2016-12-27 DIAGNOSIS — Z7901 Long term (current) use of anticoagulants: Secondary | ICD-10-CM

## 2016-12-27 DIAGNOSIS — Z952 Presence of prosthetic heart valve: Secondary | ICD-10-CM

## 2016-12-27 DIAGNOSIS — I059 Rheumatic mitral valve disease, unspecified: Secondary | ICD-10-CM

## 2016-12-27 LAB — POCT INR: INR: 4.1

## 2017-01-03 ENCOUNTER — Ambulatory Visit (INDEPENDENT_AMBULATORY_CARE_PROVIDER_SITE_OTHER): Payer: Self-pay | Admitting: *Deleted

## 2017-01-03 DIAGNOSIS — Z952 Presence of prosthetic heart valve: Secondary | ICD-10-CM

## 2017-01-03 DIAGNOSIS — Z7901 Long term (current) use of anticoagulants: Secondary | ICD-10-CM

## 2017-01-03 DIAGNOSIS — I059 Rheumatic mitral valve disease, unspecified: Secondary | ICD-10-CM

## 2017-01-03 LAB — POCT INR: INR: 2.9

## 2017-01-10 ENCOUNTER — Encounter (INDEPENDENT_AMBULATORY_CARE_PROVIDER_SITE_OTHER): Payer: Self-pay

## 2017-01-10 ENCOUNTER — Encounter: Payer: Self-pay | Admitting: Physician Assistant

## 2017-01-10 ENCOUNTER — Ambulatory Visit (INDEPENDENT_AMBULATORY_CARE_PROVIDER_SITE_OTHER): Payer: Self-pay | Admitting: Physician Assistant

## 2017-01-10 VITALS — BP 110/60 | HR 123 | Ht 62.0 in | Wt 133.1 lb

## 2017-01-10 DIAGNOSIS — K529 Noninfective gastroenteritis and colitis, unspecified: Secondary | ICD-10-CM

## 2017-01-10 DIAGNOSIS — R000 Tachycardia, unspecified: Secondary | ICD-10-CM

## 2017-01-10 DIAGNOSIS — Z954 Presence of other heart-valve replacement: Secondary | ICD-10-CM

## 2017-01-10 DIAGNOSIS — Z8673 Personal history of transient ischemic attack (TIA), and cerebral infarction without residual deficits: Secondary | ICD-10-CM

## 2017-01-10 NOTE — Progress Notes (Signed)
Cardiology Office Note:    Date:  01/10/2017   ID:  Mary [name], DOB [birth year], MRN [id number]  PCP:  Marca Ancona, MD  Cardiologist:  Dr. Marca Ancona   Electrophysiologist:  Dr. Lewayne Bunting   Referring MD: Laurey Morale, MD   Chief Complaint  Patient presents with  . Palpitations  . Diarrhea    History of Present Illness:    Mary [name] is a 56 y.o. 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  Post op course c/b CHB that recovered without needing a PPM. She was kept off all AVN blocking agents and wore a Holter monitor in 7/15 which demonstrated asymptomatic CHB while sleeping.  She saw Dr. Lewayne Bunting and pacemaker was not recommended as CHB only occurred during sleep.  She was admitted with TIA symptoms in the setting of subtherapeutic INR in 05/2016.  She has continued to have issues with sinus tachycardia and palpitations. Low-dose beta blocker was recommended but a follow-up event monitor demonstrated one episode of high-grade heart block at night and Dr. Shirlee Latch recommended that this be stopped.  She was referred back to Dr. Ladona Ridgel who saw her in 9/17.  No further recommendations were made regarding her symptomatic sinus tachycardia.   She was seen in emergency room in 3/18 with dizziness and tachycardia. D-dimer was elevated. CT was negative for pulmonary embolism. Brain MRI was unremarkable.  She is here for follow-up. She has had symptomatic palpitations for the last several days. She seems quite anxious when I walk into the room.  She gets dizzy quite often. For the last several days, she's had poor appetite, vomiting and diarrhea. Her stools are watery and she has several a day. She's not been able to hold down any food. She's not drinking much. She denies any recent antibiotic use.  Her palpitations are worse with lying down and she has to sit up at night. She's also had cough and  congestion with yellowish sputum. She denies fever.  She has been short of breath. She denies chest pain. She denies syncope.  Prior CV studies:   The following studies were reviewed today:  Event Monitor 02/24/16 1 episode of high grade heart block at night. Otherwise, mild sinus tachycardia.   Carotid US 9/17 Bilateral: soft plaque throughout CCA, ICA and ECA. 1-39% ICA plaquing. Vertebral artery flow is antegrade.  Echo 06/06/16 EF 55-60%, mechanical MVR functioning normally, mild RAE, TV repair functioning normally  Echo 11/03/15 Posterior wall mild LVH, EF 55-60%, normal wall motion, mechanical MVR okay, normal RVSF  Echo 01/01/15 Mild focal basal septal hypertrophy, EF 60-65%, normal wall motion, S/P MVR (St Jude mechanical valve) functioning well, mild MR (small mobile echodensity-likely redundant chord versus suture), mild LAE, TV mean gradient 4 mmHg.   Echo 7/15 Mild LVH, EF 60-65%, normal wall motion, grade 1 diastolic dysfunction, S/P MVR (St Jude mechanical valve) and appears functioning well, mild MR, no perivalvular leak, mild LAE, mildly reduced RVSF.   Carotid US 8/14 Bilateral - 1% to 39% ICA stenosis   Cardiac cath 6/14 Left main: No obstructive disease.  Left Anterior Descending Artery: No obstructive disease noted.  Circumflex Artery: No obstructive disease.  Right Coronary Artery: no obstructive disease.  Left Ventricular Angiogram: LVEF 65-70%. 3+ MR Distal Aortogram: No aneurysm. No stenosis.   Past Medical History:  Diagnosis Date  . Acute diastolic heart failure (HCC) 03/15/2013  .  Anemia   . Asthma    "as a baby"   . Carotid stenosis    Carotid US 2/17: bilateral ICA 1-39% >> FU prn  . Complete heart block (HCC) 05/28/2013   Post-op  . Epigastric hernia 200's  . GERD (gastroesophageal reflux disease)   . History of blood transfusion    "14 w/1st pregnancy; 2 w/last C-section" (03/15/2013)  . Hx of echocardiogram    a. Echo 4/16:  Mild  focal basal septal hypertrophy, EF 60-65%, no RWMA, Mechanical MVR ok with mild central regurgitation and no perivalvular leak, small mobile density attached to valvular ring (1.5x1 cm) - post surgical changes vs SBE, mild LAE;   b. Echo 2/17:  Mild post wall LVH, EF 55-60%, no RWMA, mechanical MVR ok (mean 5 mmHg), normal RVSF  . Hypertension    no pcp   will go to [facility]  . 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   33mm Sorin Carbomedics mechanical prosthesis via right mini thoracotomy approach  . 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 Severe mitral regurgitation 04/22/2013  . Stroke (HCC)   . SVT (supraventricular tachycardia) (HCC) 03/16/2013  . Tricuspid regurgitation     Past Surgical History:  Procedure Laterality Date  . APPENDECTOMY  [id number]  . CARDIAC CATHETERIZATION    . CESAREAN SECTION  1983; 1999  . CESAREAN SECTION WITH BILATERAL TUBAL LIGATION  1999  . FEMORAL HERNIA REPAIR Right 05/23/2013   Procedure: HERNIA REPAIR FEMORAL;  Surgeon: Purcell Nails, MD;  Location[facility];  Service: Open Heart Surgery;  Laterality: Right;  . INTRAOPERATIVE TRANSESOPHAGEAL ECHOCARDIOGRAM N/A 05/23/2013   Procedure: INTRAOPERATIVE TRANSESOPHAGEAL ECHOCARDIOGRAM;  Surgeon: Purcell Nails, MD;  Location: [facility] OR;  Service: Open Heart Surgery;  Laterality: N/A;  . LAPAROSCOPIC CHOLECYSTECTOMY  2003  . LEFT AND RIGHT HEART CATHETERIZATION WITH CORONARY ANGIOGRAM N/A 03/22/2013   Procedure: LEFT AND RIGHT HEART CATHETERIZATION WITH CORONARY ANGIOGRAM;  Surgeon: Kathleene Hazel, MD;  Location: [facility] CATH LAB;  Service: Cardiovascular;  Laterality: N/A;  . MINIMALLY INVASIVE TRICUSPID VALVE REPAIR Right 05/23/2013   Procedure: MINIMALLY INVASIVE  TRICUSPID VALVE REPAIR;  Surgeon: Purcell Nails, MD;  Location: [facility] OR;  Service: Open Heart Surgery;  Laterality: Right;  . MITRAL VALVE REPLACEMENT N/A 05/23/2013   Procedure: MITRAL VALVE (MV) REPLACEMENT;  Surgeon: Purcell Nails, MD;  Location: [facility] OR;  Service: Open Heart Surgery;  Laterality: N/A;  . MULTIPLE EXTRACTIONS WITH ALVEOLOPLASTY N/A 04/04/2013   Procedure: Extraction of tooth #'s 1,8,9,13,14,15,23,24,25,26 with alveoloplasty and gross debridement of remaining teeth;  Surgeon: Charlynne Pander, DDS;  Location: [facility] OR;  Service: Oral Surgery;  Laterality: N/A;  . TEE WITHOUT CARDIOVERSION N/A 03/18/2013   Procedure: TRANSESOPHAGEAL ECHOCARDIOGRAM (TEE);  Surgeon: Laurey Morale, MD;  Location: [facility] ENDOSCOPY;  Service: Cardiovascular;  Laterality: N/A;  . TEE WITHOUT CARDIOVERSION N/A 06/17/2013   Procedure: TRANSESOPHAGEAL ECHOCARDIOGRAM (TEE);  Surgeon: Lewayne Bunting, MD;  Location: [facility] ENDOSCOPY;  Service: Cardiovascular;  Laterality: N/A;    Current Medications: Current Meds  Medication Sig  . aspirin EC 81 MG tablet Take 1 tablet (81 mg total) by mouth daily.  [name] warfarin (COUMADIN) 5 MG tablet Take as directed by coumadin clinic  . WITCH HAZEL EX Apply 1 application topically at bedtime as  needed (knee pain).      Allergies:   Oxycodone   Social History   Social History  . Marital status: Single    Spouse name: N/A  . Number of children: N/A  . Years of education: N/A   Social History Main Topics  . Smoking status: Former Smoker    Packs/day: 0.50    Years: 36.00    Types: Cigarettes    Quit date: 03/15/2013  . Smokeless tobacco: Never Used  . Alcohol use 3.6 oz/week    6 Glasses of wine per week     Comment: 03/15/2013 "bottle of wine/wk"  . Drug use: No  . Sexual activity: No   Other Topics Concern  . None   Social History Narrative  . None     Family History  Problem Relation Age of Onset  . Cancer Mother   . Stroke Father   . Sarcoidosis  Sister   . Heart attack Neg Hx      ROS:   Please see the history of present illness.    Review of Systems  Constitution: Positive for decreased appetite and malaise/fatigue.  Cardiovascular: Positive for dyspnea on exertion and irregular heartbeat.  Respiratory: Positive for cough and shortness of breath.   Musculoskeletal: Positive for joint pain.  Gastrointestinal: Positive for diarrhea, nausea and vomiting.   All other systems reviewed and are negative.   EKGs/Labs/Other Test Reviewed:    EKG:  EKG is  ordered today.  The ekg ordered today demonstrates Sinus tachycardia, HR 123, LAD, QTc 460 ms, no change since prior tracing  Recent Labs: 12/19/2016: ALT 16; BUN 13; Creatinine, Ser 1.63; Hemoglobin 14.0; Platelets 391; Potassium 4.4; Sodium 130   Recent Lipid Panel    Component Value Date/Time   CHOL 212 (H) 06/06/2016 0135   TRIG 108 06/06/2016 0135   HDL 66 06/06/2016 0135   CHOLHDL 3.2 06/06/2016 0135   VLDL 22 06/06/2016 0135   LDLCALC 124 (H) 06/06/2016 0135     Physical Exam:    VS:  BP 110/60   Pulse (!) 123   Ht 5\' 2"  (1.575 m)   Wt 133 lb 1.9 oz (60.4 kg)   LMP 03/22/2013   SpO2 98%   BMI 24.35 kg/m     Wt Readings from Last 3 Encounters:  01/10/17 133 lb 1.9 oz (60.4 kg)  12/19/16 139 lb (63 kg)  06/22/16 139 lb 3.2 oz (63.1 kg)     Physical Exam  Constitutional: She is oriented to person, place, and time. She appears well-developed and well-nourished. She appears distressed (she is somewhat anxious as I walk in the room).  HENT:  Head: Normocephalic and atraumatic.  Eyes: No scleral icterus.  Neck: Normal range of motion. No JVD present.  Cardiovascular: Normal rate, regular rhythm and normal heart sounds.   No murmur heard. Mechanical S1, normal S2  Pulmonary/Chest: Breath sounds normal. She has no wheezes. She has no rhonchi. She has no rales.  Abdominal: Soft. There is no tenderness.  Musculoskeletal: She exhibits no edema.    Neurological: She is alert and oriented to person, place, and time.  Skin: Skin is warm and dry.  Psychiatric: She has a normal mood and affect.    ASSESSMENT:    1. Sinus tachycardia   2. Gastroenteritis   3. S/P minimally-invasive mitral valve replacement with mechanical valve and tricuspid valve repair   4. History of TIA (transient ischemic attack)    PLAN:    In order  of problems listed above:  1. Sinus tachycardia -  She has a long history of symptomatic sinus tachycardia. She has not been able to tolerate AV node blocking agents secondary to resultant high-grade heart block. She has been evaluated by Dr. Ladona Ridgel and has not required pacemaker. Her current symptoms are worsened by her acute illness.  2. Gastroenteritis - Probable viral gastroenteritis. She has had significant nausea, vomiting and frequent stools that are watery. She's not been able to hold much fluid or solids down. Her creatinine was elevated when she was in the emergency room and late March. I am concerned that she is dehydrated which is contributing to her higher heart rates. She would likely benefit from IV fluid administration and possibly observation overnight. I have recommended that she go to the emergency room for evaluation.  Will put in referral to PCP - she would benefit from establishing with the [facility].   3. S/P minimally-invasive mitral valve replacement with mechanical valve and tricuspid valve repair - Continue Coumadin. Continue SBE prophylaxis.  4. History of TIA (transient ischemic attack) - We have discussed the importance of continuing her Coumadin therapy to avoid subtherapeutic INRs and recurrent TIAs. Continue aspirin.   Dispo:  Return in about 6 months (around 07/12/2017) for Routine Follow Up, w/ Dr. Okey Dupre, w/ Tereso Newcomer, PA-C.   Medication Adjustments/Labs and Tests Ordered: Current medicines are reviewed at length with the patient today.   Concerns regarding medicines are outlined above.  Medication changes, Labs and Tests ordered today are outlined in the Patient Instructions noted below. Patient Instructions  YOU ARE BEING SENT TO [place] ED TODAY BY Jaion Lagrange, PAC   YOU ARE BEING REFERRED TO [facility] TO EST WITH A PRIMARY CARE PHYISICIAN  Signed, Tereso Newcomer, [place]  01/10/2017 11:48 AM    [facility] [address] [place], [place], [place]  [address] Phone: [phone]; Fax: [phone]

## 2017-01-10 NOTE — Patient Instructions (Addendum)
YOU ARE BEING SENT TO [place] ED TODAY BY SCOTT WEAVER, PAC   YOU ARE BEING REFERRED TO [facility] [facility] TO EST WITH A PRIMARY CARE PHYISICIAN

## 2017-01-11 ENCOUNTER — Observation Stay (HOSPITAL_COMMUNITY): Payer: Self-pay

## 2017-01-11 ENCOUNTER — Inpatient Hospital Stay (HOSPITAL_COMMUNITY)
Admission: EM | Admit: 2017-01-11 | Discharge: 2017-01-13 | DRG: 312 | Disposition: A | Discharge status: Home / Self Care | Payer: Self-pay | Attending: Internal Medicine | Admitting: Internal Medicine

## 2017-01-11 ENCOUNTER — Encounter (HOSPITAL_COMMUNITY): Payer: Self-pay | Admitting: Emergency Medicine

## 2017-01-11 DIAGNOSIS — I5030 Unspecified diastolic (congestive) heart failure: Secondary | ICD-10-CM | POA: Diagnosis present

## 2017-01-11 DIAGNOSIS — R197 Diarrhea, unspecified: Secondary | ICD-10-CM

## 2017-01-11 DIAGNOSIS — Z7901 Long term (current) use of anticoagulants: Secondary | ICD-10-CM

## 2017-01-11 DIAGNOSIS — R05 Cough: Secondary | ICD-10-CM

## 2017-01-11 DIAGNOSIS — R0981 Nasal congestion: Secondary | ICD-10-CM

## 2017-01-11 DIAGNOSIS — R000 Tachycardia, unspecified: Secondary | ICD-10-CM | POA: Diagnosis present

## 2017-01-11 DIAGNOSIS — Z952 Presence of prosthetic heart valve: Secondary | ICD-10-CM

## 2017-01-11 DIAGNOSIS — Z8673 Personal history of transient ischemic attack (TIA), and cerebral infarction without residual deficits: Secondary | ICD-10-CM

## 2017-01-11 DIAGNOSIS — Z7982 Long term (current) use of aspirin: Secondary | ICD-10-CM

## 2017-01-11 DIAGNOSIS — K219 Gastro-esophageal reflux disease without esophagitis: Secondary | ICD-10-CM | POA: Diagnosis present

## 2017-01-11 DIAGNOSIS — E860 Dehydration: Secondary | ICD-10-CM | POA: Diagnosis present

## 2017-01-11 DIAGNOSIS — A084 Viral intestinal infection, unspecified: Secondary | ICD-10-CM | POA: Diagnosis present

## 2017-01-11 DIAGNOSIS — Z8679 Personal history of other diseases of the circulatory system: Secondary | ICD-10-CM

## 2017-01-11 DIAGNOSIS — R059 Cough, unspecified: Secondary | ICD-10-CM

## 2017-01-11 DIAGNOSIS — R6883 Chills (without fever): Secondary | ICD-10-CM

## 2017-01-11 DIAGNOSIS — R112 Nausea with vomiting, unspecified: Secondary | ICD-10-CM

## 2017-01-11 DIAGNOSIS — E861 Hypovolemia: Secondary | ICD-10-CM | POA: Diagnosis present

## 2017-01-11 DIAGNOSIS — I951 Orthostatic hypotension: Principal | ICD-10-CM | POA: Diagnosis present

## 2017-01-11 DIAGNOSIS — I110 Hypertensive heart disease with heart failure: Secondary | ICD-10-CM | POA: Diagnosis present

## 2017-01-11 DIAGNOSIS — E871 Hypo-osmolality and hyponatremia: Secondary | ICD-10-CM | POA: Diagnosis present

## 2017-01-11 DIAGNOSIS — N179 Acute kidney failure, unspecified: Secondary | ICD-10-CM | POA: Diagnosis present

## 2017-01-11 DIAGNOSIS — R69 Illness, unspecified: Secondary | ICD-10-CM

## 2017-01-11 DIAGNOSIS — K529 Noninfective gastroenteritis and colitis, unspecified: Secondary | ICD-10-CM

## 2017-01-11 DIAGNOSIS — Z954 Presence of other heart-valve replacement: Secondary | ICD-10-CM

## 2017-01-11 DIAGNOSIS — Z885 Allergy status to narcotic agent status: Secondary | ICD-10-CM

## 2017-01-11 DIAGNOSIS — I5032 Chronic diastolic (congestive) heart failure: Secondary | ICD-10-CM | POA: Diagnosis present

## 2017-01-11 DIAGNOSIS — R748 Abnormal levels of other serum enzymes: Secondary | ICD-10-CM | POA: Diagnosis present

## 2017-01-11 DIAGNOSIS — R740 Nonspecific elevation of levels of transaminase and lactic acid dehydrogenase [LDH]: Secondary | ICD-10-CM

## 2017-01-11 DIAGNOSIS — R791 Abnormal coagulation profile: Secondary | ICD-10-CM | POA: Diagnosis present

## 2017-01-11 HISTORY — DX: Unspecified asthma, uncomplicated: J45.909

## 2017-01-11 LAB — BASIC METABOLIC PANEL
ANION GAP: 9 (ref 5–15)
Anion gap: 10 (ref 5–15)
BUN: 10 mg/dL (ref 6–20)
BUN: 11 mg/dL (ref 6–20)
CHLORIDE: 107 mmol/L (ref 101–111)
CHLORIDE: 107 mmol/L (ref 101–111)
CO2: 18 mmol/L — ABNORMAL LOW (ref 22–32)
CO2: 19 mmol/L — ABNORMAL LOW (ref 22–32)
Calcium: 8.3 mg/dL — ABNORMAL LOW (ref 8.9–10.3)
Calcium: 8.4 mg/dL — ABNORMAL LOW (ref 8.9–10.3)
Creatinine, Ser: 0.98 mg/dL (ref 0.44–1.00)
Creatinine, Ser: 0.98 mg/dL (ref 0.44–1.00)
GFR calc Af Amer: >60 mL/min (ref >60)
GFR calc Af Amer: >60 mL/min (ref >60)
GFR calc non Af Amer: >60 mL/min (ref >60)
GFR calc non Af Amer: >60 mL/min (ref >60)
GLUCOSE: 87 mg/dL (ref 65–99)
Glucose, Bld: 81 mg/dL (ref 65–99)
POTASSIUM: 4.3 mmol/L (ref 3.5–5.1)
POTASSIUM: 4.1 mmol/L (ref 3.5–5.1)
SODIUM: 135 mmol/L (ref 135–145)
SODIUM: 135 mmol/L (ref 135–145)

## 2017-01-11 LAB — GASTROINTESTINAL PANEL BY PCR, STOOL (REPLACES STOOL CULTURE)

## 2017-01-11 LAB — CBC WITH DIFFERENTIAL/PLATELET
Basophils Absolute: 0 10*3/uL (ref 0.0–0.1)
Basophils Relative: 0 %
EOS ABS: 0.2 10*3/uL (ref 0.0–0.7)
Eosinophils Relative: 3 %
HEMATOCRIT: 40.5 % (ref 36.0–46.0)
HEMOGLOBIN: 14.5 g/dL (ref 12.0–15.0)
Lymphocytes Relative: 22 %
Lymphs Abs: 1.3 10*3/uL (ref 0.7–4.0)
MCH: 35.7 pg — ABNORMAL HIGH (ref 26.0–34.0)
MCHC: 35.8 g/dL (ref 30.0–36.0)
MCV: 99.8 fL (ref 78.0–100.0)
MONO ABS: 0.5 10*3/uL (ref 0.1–1.0)
MONOS PCT: 9 %
NEUTROS ABS: 3.8 10*3/uL (ref 1.7–7.7)
NEUTROS PCT: 66 %
Platelets: 257 10*3/uL (ref 150–400)
RBC: 4.06 MIL/uL (ref 3.87–5.11)
RDW: 13.9 % (ref 11.5–15.5)
WBC: 5.8 10*3/uL (ref 4.0–10.5)

## 2017-01-11 LAB — COMPREHENSIVE METABOLIC PANEL
ALT: 22 U/L (ref 14–54)
ANION GAP: 10 (ref 5–15)
AST: 54 U/L — ABNORMAL HIGH (ref 15–41)
Albumin: 3.2 g/dL — ABNORMAL LOW (ref 3.5–5.0)
Alkaline Phosphatase: 105 U/L (ref 38–126)
BILIRUBIN TOTAL: 0.2 mg/dL — AB (ref 0.3–1.2)
BUN: 13 mg/dL (ref 6–20)
CALCIUM: 8.5 mg/dL — AB (ref 8.9–10.3)
CO2: 21 mmol/L — AB (ref 22–32)
Chloride: 98 mmol/L — ABNORMAL LOW (ref 101–111)
Creatinine, Ser: 1.25 mg/dL — ABNORMAL HIGH (ref 0.44–1.00)
GFR calc Af Amer: 55 mL/min — ABNORMAL LOW (ref >60)
GFR calc non Af Amer: 48 mL/min — ABNORMAL LOW (ref >60)
GLUCOSE: 78 mg/dL (ref 65–99)
Potassium: 4.4 mmol/L (ref 3.5–5.1)
SODIUM: 129 mmol/L — AB (ref 135–145)
TOTAL PROTEIN: 6.3 g/dL — AB (ref 6.5–8.1)

## 2017-01-11 LAB — C DIFFICILE QUICK SCREEN W PCR REFLEX
C Diff antigen: NEGATIVE
C Diff interpretation: NOT DETECTED
C Diff toxin: NEGATIVE

## 2017-01-11 LAB — LIPASE, BLOOD: Lipase: 25 U/L (ref 11–51)

## 2017-01-11 LAB — PROTIME-INR
INR: 2.01
Prothrombin Time: 23.1 seconds — ABNORMAL HIGH (ref 11.4–15.2)

## 2017-01-11 LAB — INFLUENZA PANEL BY PCR (TYPE A & B)
INFLAPCR: NEGATIVE
INFLBPCR: NEGATIVE

## 2017-01-11 LAB — HEPARIN LEVEL (UNFRACTIONATED): Heparin Unfractionated: 0.22 IU/mL — ABNORMAL LOW (ref 0.30–0.70)

## 2017-01-11 LAB — OSMOLALITY: Osmolality: 279 mOsm/kg (ref 275–295)

## 2017-01-11 LAB — I-STAT CG4 LACTIC ACID, ED: Lactic Acid, Venous: 0.78 mmol/L (ref 0.5–1.9)

## 2017-01-11 MED ORDER — ONDANSETRON HCL 4 MG/2ML IJ SOLN: 4.0000 mg | Freq: Four times a day (QID) | INTRAMUSCULAR | Status: DC | PRN

## 2017-01-11 MED ORDER — ENSURE ENLIVE PO LIQD
237.0000 mL | Freq: Two times a day (BID) | ORAL | Status: DC
  Administered 2017-01-12 – 2017-01-13 (×2): 237 mL via ORAL

## 2017-01-11 MED ORDER — ACETAMINOPHEN 325 MG PO TABS: 650.0000 mg | ORAL_TABLET | Freq: Four times a day (QID) | ORAL | Status: DC | PRN

## 2017-01-11 MED ORDER — ACETAMINOPHEN 650 MG RE SUPP: 650.0000 mg | Freq: Four times a day (QID) | RECTAL | Status: DC | PRN

## 2017-01-11 MED ORDER — ASPIRIN EC 81 MG PO TBEC
81.0000 mg | DELAYED_RELEASE_TABLET | Freq: Every day | ORAL | Status: DC
  Administered 2017-01-11 – 2017-01-13 (×3): 81 mg via ORAL
  Filled 2017-01-11 (×4): qty 1

## 2017-01-11 MED ORDER — FLUTICASONE PROPIONATE 50 MCG/ACT NA SUSP
1.0000 | Freq: Every day | NASAL | Status: DC
Start: 2017-01-11 — End: 2017-01-13
  Filled 2017-01-11: qty 16

## 2017-01-11 MED ORDER — ONDANSETRON HCL 4 MG PO TABS: 4.0000 mg | ORAL_TABLET | Freq: Four times a day (QID) | ORAL | Status: DC | PRN

## 2017-01-11 MED ORDER — SODIUM CHLORIDE 0.9% FLUSH
3.0000 mL | Freq: Two times a day (BID) | INTRAVENOUS | Status: DC
  Administered 2017-01-11 – 2017-01-12 (×3): 3 mL via INTRAVENOUS

## 2017-01-11 MED ORDER — GUAIFENESIN ER 600 MG PO TB12: 600.0000 mg | ORAL_TABLET | Freq: Two times a day (BID) | ORAL | Status: DC

## 2017-01-11 MED ORDER — DM-GUAIFENESIN ER 30-600 MG PO TB12
1.0000 | ORAL_TABLET | Freq: Two times a day (BID) | ORAL | Status: DC
  Administered 2017-01-11 – 2017-01-13 (×5): 1 via ORAL
  Filled 2017-01-11 (×6): qty 1

## 2017-01-11 MED ORDER — HEPARIN (PORCINE) IN NACL 100-0.45 UNIT/ML-% IJ SOLN
950.0000 [IU]/h | INTRAMUSCULAR | Status: AC
  Administered 2017-01-11: 850 [IU]/h via INTRAVENOUS
  Filled 2017-01-11: qty 250

## 2017-01-11 MED ORDER — SODIUM CHLORIDE 0.9 % IV BOLUS (SEPSIS)
1000.0000 mL | Freq: Once | INTRAVENOUS | Status: AC
  Administered 2017-01-11: 1000 mL via INTRAVENOUS

## 2017-01-11 MED ORDER — ONDANSETRON HCL 4 MG/2ML IJ SOLN
4.0000 mg | Freq: Once | INTRAMUSCULAR | Status: AC
  Administered 2017-01-11: 4 mg via INTRAVENOUS
  Filled 2017-01-11: qty 2

## 2017-01-11 NOTE — Progress Notes (Signed)
ANTICOAGULATION CONSULT NOTE - Initial Consult  Pharmacy Consult for heparin and warfarin Indication: mechanical valve  Allergies  Allergen Reactions  . Oxycodone Itching   Patient Measurements: Height: 5\' 2"  (157.5 cm) Weight: 133 lb (60.3 kg) IBW/kg (Calculated) : 50.1 Heparin Dosing Weight: 60.3 kg  Vital Signs: Temp: 98 F (36.7 C) (04/18 0647) Temp Source: Oral (04/18 0647) BP: 117/76 (04/18 1045) Pulse Rate: 102 (04/18 1045)  Labs:  Recent Labs  01/11/17 0736  HGB 14.5  HCT 40.5  PLT 257  LABPROT 23.1*  INR 2.01  CREATININE 1.25*    Estimated Creatinine Clearance: 44 mL/min (A) (by C-G formula based on SCr of 1.25 mg/dL (H)).   Medical History: Past Medical History:  Diagnosis Date  . Acute diastolic heart failure ([place]) 03/15/2013  . Anemia   . Asthma    "as a baby"   . Carotid stenosis    Carotid US 2/17: bilateral ICA 1-39% >> FU prn  . Complete heart block ([place]) 05/28/2013   Post-op  . Epigastric hernia 200's  . GERD (gastroesophageal reflux disease)   . History of blood transfusion    "14 w/1st pregnancy; 2 w/last C-section" (03/15/2013)  . Hx of echocardiogram    a. Echo 4/16:  Mild focal basal septal hypertrophy, EF 60-65%, no RWMA, Mechanical MVR ok with mild central regurgitation and no perivalvular leak, small mobile density attached to valvular ring (1.5x1 cm) - post surgical changes vs SBE, mild LAE;   b. Echo 2/17:  Mild post wall LVH, EF 55-60%, no RWMA, mechanical MVR ok (mean 5 mmHg), normal RVSF  . Hypertension    no pcp   will go to [facility]  . 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 S/P mitral valve replacement with metallic valve 3/97/6734   32mm Sorin Carbomedics mechanical prosthesis via right mini thoracotomy approach  . 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 Severe mitral regurgitation 04/22/2013  . Stroke ([place])   . SVT (supraventricular tachycardia) ([place]) 03/16/2013  . Tricuspid regurgitation     Medications:   (Not in a hospital admission)  Assessment: Mary [name] is a 56 y.o. female on warfarin PTA for mechanical valve.Last dose PTA noted to be on 01/10/17. Patient has had nausea and vomiting x3 days. INR 2.01 on admit subtherapeutic. CBC stable WNL. Will bridge with heparin since INR subtherapeutic    PTA warfarin 7.5mg  M,W,F,S,Su and 5mg  Tu,Thur  Goal of Therapy:  INR 2-3 Heparin level 0.3-0.7 units/ml Monitor platelets by anticoagulation protocol: Yes   Plan:  Warfarin 10mg  X1 tonight Start heparin infusion at 850 units/hr Check anti-Xa level in 6 hours and daily while on heparin Continue to monitor H&H and platelets  Nicole Kindred L Ryann Pauli 01/11/2017,11:02 AM

## 2017-01-11 NOTE — Progress Notes (Signed)
Admission note:  Arrival Method: Patient arrived on a stretcher from ED. Mental Orientation:A&O x 4. Telemetry: 6E-11, ST CCMD notified. Assessment: See doc flow sheets. Skin: warm dry and intact.  No open areas or any wounds noted. IV: Left forearm heparin infusing. Pain: Denies any pain. Safety Measures: Bed in low position, call bell and phone within reach. Fall Prevention Safety Plan: Reviewed the plan,understood and acknowledged. Admission Screening: In progress. 6700 Orientation: Patient has been oriented to the unit, staff and to the room.

## 2017-01-11 NOTE — ED Triage Notes (Signed)
Pt was seen yesterday at [facility] care by scott weaver and was referred to the ER. Pt was unable to come til today. Pt states persistant vomiting and diarrhea and is unable to keep anything down.

## 2017-01-11 NOTE — ED Provider Notes (Signed)
[facility] Provider Note   CSN: [id number] Arrival date & time: 01/11/17  9381     History   Chief Complaint Chief Complaint  Patient presents with  . GI Problem    HPI Mary [name] is a 56 y.o. female.  HPI Had acute onset of vomiting and diarrhea 3 days ago. She reports multiple episodes of each per day. No blood in the emesis or the diarrhea. Patient is on Coumadin for mechanical valve replacement from 2014. She reports she has not been able to tolerate her medications. Patient denies fever but reports she has had some hot cold episodes. She reports some diffuse abdominal discomfort but believes it secondary to multiple episodes of vomiting. She was seen by her family doctor yesterday and advised to come to the emergency department at that time however she is presenting today. She reports symptoms seem to be abating slightly today. She did still however have loose stool while in the emergency department. The patient does not have a travel history, she drinks city water. Patient reports no known sick contacts. Only associated symptom is cough. She reports it is kind of a "deep" cough. No chest pain or dyspnea. No lower extremity swelling or calf pain. Past Medical History:  Diagnosis Date  . Acute diastolic heart failure ([place]) 03/15/2013  . Anemia   . Carotid stenosis    Carotid US 2/17: bilateral ICA 1-39% >> FU prn  . Childhood asthma   . Complete heart block ([place]) 05/28/2013   Post-op  . Epigastric hernia 200's  . GERD (gastroesophageal reflux disease)   . Heart murmur   . History of blood transfusion    "14 w/1st pregnancy; 2 w/last C-section" (03/15/2013)  . Hx of echocardiogram    a. Echo 4/16:  Mild focal basal septal hypertrophy, EF 60-65%, no RWMA, Mechanical MVR ok with mild central regurgitation and no perivalvular leak, small mobile density attached to valvular ring (1.5x1 cm) - post surgical changes vs SBE, mild LAE;   b. Echo 2/17:  Mild post wall LVH,  EF 55-60%, no RWMA, mechanical MVR ok (mean 5 mmHg), normal RVSF  . Hypertension    no pcp   will go to [facility]  . 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 S/P mitral valve replacement with metallic valve 0/17/5102   5mm Sorin Carbomedics mechanical prosthesis via right mini thoracotomy approach  . 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   Complex valvuloplasty including Cor-matrix ECM patch augmentation of anterior and lateral leaflets with 60mm Edwards mc3 ring annuloplasty via right mini-thoracotomy approach  . Severe mitral regurgitation 04/22/2013  . Stroke ([place])   . SVT (supraventricular tachycardia) ([place]) 03/16/2013  . Tricuspid regurgitation     Patient Active Problem List   Diagnosis Date Noted  . Orthostatic hypotension 01/11/2017  . Chronic hyponatremia 01/11/2017  . Elevated liver enzymes 01/11/2017  . AKI (acute kidney injury) ([place]) 01/10/2016  . Subtherapeutic international normalized ratio (INR) 01/10/2016  . TIA (transient ischemic attack) 09/13/2015  . History of rheumatic heart disease 09/13/2015  . CVA (cerebral vascular accident) ([place]) 09/12/2015  . First degree heart block 06/17/2013  . Weakness 06/16/2013  . (HFpEF) heart failure with preserved ejection fraction ([place]) 06/16/2013  . Anemia 06/16/2013  . History of bacterial endocarditis 06/16/2013  . Heart valve replaced by other means 06/10/2013  . Long term (current) use of anticoagulants 06/10/2013  . History of complete heart block 05/28/2013  . S/P minimally-invasive mitral valve replacement with mechanical valve  and tricuspid valve repair 05/23/2013  . S/P minimally-invasive tricuspid valve repair 05/23/2013  . Femoral hernia 05/23/2013  . Severe mitral regurgitation 04/22/2013  . Mitral valve disorders(424.0) 04/22/2013  . Tricuspid regurgitation 04/22/2013  . 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 SVT (supraventricular tachycardia) ([place]) 03/16/2013    Past Surgical  History:  Procedure Laterality Date  . APPENDECTOMY  [id number]  . CARDIAC CATHETERIZATION    . CARDIAC VALVE REPLACEMENT    . CESAREAN SECTION  1983  . CESAREAN SECTION WITH BILATERAL TUBAL LIGATION  1999  . FEMORAL HERNIA REPAIR Right 05/23/2013   Procedure: HERNIA REPAIR FEMORAL;  Surgeon: Rexene Alberts, MD;  Location: [facility];  Service: Open Heart Surgery;  Laterality: Right;  . INTRAOPERATIVE TRANSESOPHAGEAL ECHOCARDIOGRAM N/A 05/23/2013   Procedure: INTRAOPERATIVE TRANSESOPHAGEAL ECHOCARDIOGRAM;  Surgeon: Rexene Alberts, MD;  Location: [facility];  Service: Open Heart Surgery;  Laterality: N/A;  . LAPAROSCOPIC CHOLECYSTECTOMY  2003  . LEFT AND RIGHT HEART CATHETERIZATION WITH CORONARY ANGIOGRAM N/A 03/22/2013   Procedure: LEFT AND RIGHT HEART CATHETERIZATION WITH CORONARY ANGIOGRAM;  Surgeon: Burnell Blanks, MD;  Location: [facility] CATH LAB;  Service: Cardiovascular;  Laterality: N/A;  . MINIMALLY INVASIVE TRICUSPID VALVE REPAIR Right 05/23/2013   Procedure: MINIMALLY INVASIVE TRICUSPID VALVE REPAIR;  Surgeon: Rexene Alberts, MD;  Location: [facility];  Service: Open Heart Surgery;  Laterality: Right;  . MITRAL VALVE REPLACEMENT N/A 05/23/2013   Procedure: MITRAL VALVE (MV) REPLACEMENT;  Surgeon: Rexene Alberts, MD;  Location: [facility];  Service: Open Heart Surgery;  Laterality: N/A;  . MULTIPLE EXTRACTIONS WITH ALVEOLOPLASTY N/A 04/04/2013   Procedure: Extraction of tooth #'s 1,8,9,13,14,15,23,24,25,26 with alveoloplasty and gross debridement of remaining teeth;  Surgeon: Lenn Cal, DDS;  Location: [facility];  Service: Oral Surgery;  Laterality: N/A;  . TEE WITHOUT CARDIOVERSION N/A 03/18/2013   Procedure: TRANSESOPHAGEAL ECHOCARDIOGRAM (TEE);  Surgeon: Larey Dresser, MD;  Location: [facility];  Service: Cardiovascular;  Laterality: N/A;  . TEE WITHOUT CARDIOVERSION N/A 06/17/2013   Procedure: TRANSESOPHAGEAL ECHOCARDIOGRAM (TEE);  Surgeon: Lelon Perla, MD;  Location: [facility] ENDOSCOPY;  Service:  Cardiovascular;  Laterality: N/A;  . TUBAL LIGATION  1999    OB History    No data available       Home Medications    Prior to Admission medications   Medication Sig Start Date End Date Taking? Authorizing Provider  aspirin EC 81 MG tablet Take 1 tablet (81 mg total) by mouth daily. 08/26/13  Yes Larey Dresser, MD  warfarin (COUMADIN) 5 MG tablet Take as directed by coumadin clinic Patient taking differently: Take 5-7.5 mg by mouth daily at 6 PM. 7.5mg  on M, W, F, S Su - 5mg  on Tu, Thur 12/12/16  Yes Evans Lance, MD  [facility] HAZEL EX Apply 1 application topically at bedtime as needed (knee pain).    Yes Historical Provider, MD  enoxaparin (LOVENOX) 60 MG/0.6ML injection Inject 0.6 mLs (60 mg total) into the skin every 12 (twelve) hours. 01/13/17   [name], MD    Family History Family History  Problem Relation Age of Onset  . Cancer Mother   . Stroke Father   . Sarcoidosis Sister   . Heart attack Neg Hx     Social History Social History  Substance Use Topics  . Smoking status: Former Smoker    Packs/day: 0.50    Years: 36.00    Types: Cigarettes    Quit date: 03/15/2013  . Smokeless  tobacco: Never Used  . Alcohol use Yes     Comment: 01/11/2017  "glass of wine/month"     Allergies   Oxycodone   Review of Systems Review of Systems 10 Systems reviewed and are negative for acute change except as noted in the HPI.   Physical Exam Updated Vital Signs BP (!) 120/98 (BP Location: Right Leg) Comment: notified nurse  Pulse (!) 107   Temp 97.5 F (36.4 C) (Oral)   Resp 17   Ht 5\' 2"  (1.575 m)   Wt 132 lb 4.4 oz (60 kg)   LMP 03/22/2013   SpO2 100%   BMI 24.19 kg/m   Physical Exam  Constitutional: She is oriented to person, place, and time. She appears well-developed and well-nourished. No distress.  HENT:  Head: Normocephalic and atraumatic.  Mouth/Throat: Oropharynx is clear and moist.  Eyes: Conjunctivae and EOM are normal.  Neck: Neck supple.    Cardiovascular: Regular rhythm.   No murmur heard. Systolic click of mitral valve. No gross murmur. Tachycardia.  Pulmonary/Chest: Effort normal and breath sounds normal. No respiratory distress.  Abdominal: Soft. Bowel sounds are normal. She exhibits no distension. There is tenderness.  Mild diffuse tenderness without guarding  Musculoskeletal: Normal range of motion. She exhibits no edema or tenderness.  Neurological: She is alert and oriented to person, place, and time.  Patient is at neurological baseline. Alert appropriate and following all commands. Residual left-sided weakness from prior stroke.  Skin: Skin is warm and dry.  Psychiatric: She has a normal mood and affect.  Nursing note and vitals reviewed.    ED Treatments / Results  Labs (all labs ordered are listed, but only abnormal results are displayed) Labs Reviewed  COMPREHENSIVE METABOLIC PANEL - Abnormal; Notable for the following:       Result Value   Sodium 129 (*)    Chloride 98 (*)    CO2 21 (*)    Creatinine, Ser 1.25 (*)    Calcium 8.5 (*)    Total Protein 6.3 (*)    Albumin 3.2 (*)    AST 54 (*)    Total Bilirubin 0.2 (*)    GFR calc non Af Amer 48 (*)    GFR calc Af Amer 55 (*)    All other components within normal limits  CBC WITH DIFFERENTIAL/PLATELET - Abnormal; Notable for the following:    MCH 35.7 (*)    All other components within normal limits  PROTIME-INR - Abnormal; Notable for the following:    Prothrombin Time 23.1 (*)    All other components within normal limits  URINALYSIS, ROUTINE W REFLEX MICROSCOPIC - Abnormal; Notable for the following:    APPearance HAZY (*)    Hgb urine dipstick LARGE (*)    Ketones, ur 5 (*)    Protein, ur 100 (*)    Bacteria, UA RARE (*)    Squamous Epithelial / LPF 0-5 (*)    All other components within normal limits  HEPARIN LEVEL (UNFRACTIONATED) - Abnormal; Notable for the following:    Heparin Unfractionated 0.22 (*)    All other components within  normal limits  BASIC METABOLIC PANEL - Abnormal; Notable for the following:    CO2 18 (*)    Calcium 8.3 (*)    All other components within normal limits  BASIC METABOLIC PANEL - Abnormal; Notable for the following:    CO2 19 (*)    Calcium 8.4 (*)    All other components within normal limits  BASIC METABOLIC PANEL - Abnormal; Notable for the following:    Calcium 8.5 (*)    All other components within normal limits  COMPREHENSIVE METABOLIC PANEL - Abnormal; Notable for the following:    CO2 21 (*)    Calcium 8.4 (*)    Total Protein 5.5 (*)    Albumin 2.6 (*)    All other components within normal limits  CBC - Abnormal; Notable for the following:    RBC 3.54 (*)    HCT 35.8 (*)    MCV 101.1 (*)    MCH 34.7 (*)    All other components within normal limits  PROTIME-INR - Abnormal; Notable for the following:    Prothrombin Time 18.4 (*)    All other components within normal limits  PROTIME-INR - Abnormal; Notable for the following:    Prothrombin Time 15.3 (*)    All other components within normal limits  CBC - Abnormal; Notable for the following:    RBC 3.43 (*)    HCT 35.0 (*)    MCV 102.0 (*)    MCH 35.0 (*)    All other components within normal limits  GASTROINTESTINAL PANEL BY PCR, STOOL (REPLACES STOOL CULTURE)  C DIFFICILE QUICK SCREEN W PCR REFLEX  LIPASE, BLOOD  INFLUENZA PANEL BY PCR (TYPE A & B)  OSMOLALITY, URINE  OSMOLALITY  SODIUM, URINE, RANDOM  HIV ANTIBODY (ROUTINE TESTING)  HEPATITIS C ANTIBODY  HEPARIN LEVEL (UNFRACTIONATED)  HEPARIN LEVEL (UNFRACTIONATED)  I-STAT CG4 LACTIC ACID, ED    EKG  EKG Interpretation None       Radiology No results found.  Procedures Procedures (including critical care time)  Medications Ordered in ED Medications  heparin ADULT infusion 100 units/mL (25000 units/266mL sodium chloride 0.45%) (0 Units/hr Intravenous Stopping Infusion hung by another clincian 01/12/17 0808)  dextrose 5 % solution ( Intravenous  New Bag/Given 01/12/17 0230)  sodium chloride 0.9 % bolus 1,000 mL (0 mLs Intravenous Stopped 01/11/17 0900)  ondansetron (ZOFRAN) injection 4 mg (4 mg Intravenous Given 01/11/17 0742)  sodium chloride 0.9 % bolus 1,000 mL (0 mLs Intravenous Stopping Infusion hung by another clincian 01/11/17 1100)  warfarin (COUMADIN) tablet 12.5 mg (12.5 mg Oral Given 01/12/17 1741)  warfarin (COUMADIN) tablet 12.5 mg (12.5 mg Oral Given 01/13/17 1406)  enoxaparin (LOVENOX) injection 80 mg (80 mg Subcutaneous Given 01/13/17 1406)     Initial Impression / Assessment and Plan / ED Course  I have reviewed the triage vital signs and the nursing notes.  Pertinent labs & imaging results that were available during my care of the patient were reviewed by me and considered in my medical decision making (see chart for details).  Clinical Course as of Jan 14 1134  Wed Jan 11, 2017  0958 Consult with internal medicine teaching service Dr. Martyn Malay will evaluate the patient for admission.  [MP]    Clinical Course User Index [MP] Charlesetta Shanks, MD     Final Clinical Impressions(s) / ED Diagnoses   Final diagnoses:  Cough  Gastroenteritis  Dehydration  Severe comorbid illness  Mechanical heart valve present  Patient describes symptoms of gastroenteritis with ongoing volume loss with recurrent diarrhea and vomiting. Patient has significant comorbid illness of mechanical heart valve and reports not meal to tolerate her Coumadin. Plan will be for admission for ongoing hydration and medication monitoring with acute GI illness likely viral in nature.  New Prescriptions Discharge Medication List as of 01/13/2017 12:20 PM  Charlesetta Shanks, MD 01/14/17 [phone]

## 2017-01-11 NOTE — H&P (Signed)
Date: 01/11/2017               Patient Name:  Mary [name] MRN: [id number]  DOB: [birth year] Age / Sex: 56 y.o.,  female   PCP: No Pcp Per Patient         Medical Service: Internal Medicine Teaching Service         Attending Physician: Dr. Lucious Groves, DO    First Contact: Dr. Reesa Chew Pager: [phone]  Second Contact: Dr. Tiburcio Pea Pager: [phone]       After Hours (After 5p/  First Contact Pager: [phone]  weekends / holidays): Second Contact Pager: [phone]   Chief Complaint: vomiting and diarrhea  History of Present Illness: Ms. Mckenny is a 31 F with PMHx of Rheumatic Mitral Valve Disease with Severe MVR s/p Mitral Valve Replacement with Mechanical Valve and Tricuspid Valve Repair in 2014 on long term coumadin, HFpEF, and h/o TIA who Presents to the emergency department with complaint of vomiting and diarrhea.  Patient states that 5 days ago she developed acute onset of chills, nasal congestion, productive cough of clear sputum, nausea, vomiting, diarrhea, weakness, and fatigue. Patient states she has had recurrent vomiting every 2 hours when her symptoms first started. Her vomiting has now improved, but she still has difficulty keeping food down. At its worst point, patient would be vomiting and having diarrhea at the same time. She denies any hematemesis. Her vomit was yellow-green. Patient describes her diarrhea as loose, watery with food particles. It is associated with abdominal cramping and pain. She also feels that her diarrhea has improved. She has had 2 episodes today. She denies any melena or hematochezia. Patient's last full meal was on Friday. She feels lightheaded and weak from not eating and drinking. Patient was seen by her cardiologist yesterday for routine follow-up and recommended to be seen in the emergency department given her symptoms. Overall, patient feels improved, but still nauseous with lightheadedness and weakness. Although her vomiting and diarrhea are  improving, she feels her shortness of breath and cough are worsening. She denies any associated chest pain or dysuria.  Patient has a history of severe mitral valve regurgitation from rheumatic valve disease status post repair in 2014. She is on long-term Coumadin with a goal INR of 2.5-3.5. Given her inability to keep medications down, patient's INR today is subtherapeutic at 2.01. She has a history of TIA in 2017 when her INR was subtherapeutic.  In the emergency department, patient was found to be afebrile, normotensive, tachycardic at 107, respiratory rate 17, satting 98% on room air. Orthostatic vital signs were positive showing that patient's blood pressure decreased from standing at 116/79-94/74 sitting to 86/74 standing. Her heart rate remained tachycardic going from 110 to 109 to 114. She felt lightheaded upon standing. Laboratory workup was significant for hyponatremia at 129 and a mild AKI with creatinine of 1.25. No leukocytosis. INR 2.01. Lactic acid normal. AST mildly elevated at 54. Patient received normal saline 1 L bolus and Zofran in the emergency department.  Meds: Current Facility-Administered Medications  Medication Dose Route Frequency Provider Last Rate Last Dose  . heparin ADULT infusion 100 units/mL (25000 units/233mL sodium chloride 0.45%)  850 Units/hr Intravenous Continuous Lucious Groves, DO       Current Outpatient Prescriptions  Medication Sig Dispense Refill  . aspirin EC 81 MG tablet Take 1 tablet (81 mg total) by mouth daily.    [name] warfarin (COUMADIN) 5 MG tablet Take as directed by  coumadin clinic (Patient taking differently: Take 5-7.5 mg by mouth daily at 6 PM. 7.5mg  on M, W, F, S Su - 5mg  on Tu, Thur) 50 tablet 0  . WITCH HAZEL EX Apply 1 application topically at bedtime as needed (knee pain).       Allergies: Allergies as of 01/11/2017 - Review Complete 01/11/2017  Allergen Reaction Noted  . Oxycodone Itching 04/15/2013   Past Medical History:  Diagnosis  Date  . Acute diastolic heart failure ([place]) 03/15/2013  . Anemia   . Asthma    "as a baby"   . Carotid stenosis    Carotid US 2/17: bilateral ICA 1-39% >> FU prn  . Complete heart block ([place]) 05/28/2013   Post-op  . Epigastric hernia 200's  . GERD (gastroesophageal reflux disease)   . History of blood transfusion    "14 w/1st pregnancy; 2 w/last C-section" (03/15/2013)  . Hx of echocardiogram    a. Echo 4/16:  Mild focal basal septal hypertrophy, EF 60-65%, no RWMA, Mechanical MVR ok with mild central regurgitation and no perivalvular leak, small mobile density attached to valvular ring (1.5x1 cm) - post surgical changes vs SBE, mild LAE;   b. Echo 2/17:  Mild post wall LVH, EF 55-60%, no RWMA, mechanical MVR ok (mean 5 mmHg), normal RVSF  . Hypertension    no pcp   will go to [facility]  . 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 S/P mitral valve replacement with metallic valve 03/24/4764   36mm Sorin Carbomedics mechanical prosthesis via right mini thoracotomy approach  . 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   Complex valvuloplasty including Cor-matrix ECM patch augmentation of anterior and lateral leaflets with 67mm Edwards mc3 ring annuloplasty via right mini-thoracotomy approach  . Severe mitral regurgitation 04/22/2013  . Stroke ([place])   . SVT (supraventricular tachycardia) ([place]) 03/16/2013  . Tricuspid regurgitation     Family History:  Father: CVA  Social History:  Tobacco Use: Denies Alcohol Use: Occasional wine Illicit Drug Use: Denies  Review of Systems: A complete ROS was reviewed and negative except as per HPI.   Physical Exam: Blood pressure 117/76, pulse (!) 102, temperature 98 F (36.7 C), temperature source Oral, resp. rate 18, height 5\' 2"  (1.575 m), weight 133 lb (60.3 kg), last menstrual period 03/22/2013, SpO2 98 %. General: Vital signs reviewed.  Patient is in no acute distress and cooperative with exam.  Eyes: PERRL, conjunctivae normal, no scleral  icterus.  Ears, Nose, Throat, and Mouth: Erythematous, edematous bilateral nasal turbinates. Normal posterior oropharynx. Dry mucous membranes.  Cardiovascular: Tachycardic, regular rhythm,  Mechanical S1. No JVD or carotid bruit present. No lower extremity edema bilaterally. Bilateral radial and pedal pulses are intact and symmetric bilaterally.  Pulmonary: Clear to auscultation bilaterally, no wheezes, rales, or rhonchi. No accessory muscle use. Gastrointestinal: Soft, non-tender, non-distended, BS hyperactive, no guarding present.  Neurologic: Awake, oriented, moving all extremities equally Skin: Warm, dry and intact. No rashes or erythema. normal skin turgor. Psychiatric: Normal mood and affect. speech and behavior is normal. Cognition and memory are normal.   Assessment & Plan by Problem: Principal Problem:   Orthostatic hypotension Active Problems:   S/P minimally-invasive mitral valve replacement with mechanical valve and tricuspid valve repair   History of complete heart block   Long term (current) use of anticoagulants   (HFpEF) heart failure with preserved ejection fraction (HCC)   AKI (acute kidney injury) ([place])   Subtherapeutic international normalized ratio (INR)   Chronic hyponatremia  Elevated liver enzymes  Ms. Stigler is a 75 F with PMHx of Rheumatic Mitral Valve Disease with Severe MVR s/p Mitral Valve Replacement with Mechanical Valve and Tricuspid Valve Repair in 2014 on long term coumadin, HFpEF, and h/o TIA who presents to the emergency department with complaint of vomiting and diarrhea.  Orthostatic hypotension: Orthostatic vital signs were positive showing that patient's blood pressure decreased from standing at 116/79-94/74 sitting to 86/74 standing. Her heart rate remained tachycardic going from 110 to 109 to 114. She felt lightheaded upon standing. Likely secondary to dehydration in the setting of persistent vomiting and diarrhea without ability to keep down food or  liquids. Patient received a 1 L normal saline bolus in the ED. -Normal saline 100 mL/hour -BMET Q6H to ensure hyponatremia does not correct too quickly -Repeat orthostatic vital signs tomorrow morning  Probable viral gastroenteritis: Patient presents with a four-day history of nausea, vomiting, diarrhea. Influenza is also on the differential given concomitant nasal congestion and cough. We will provide supportive measures while testing. -Clostridium difficile PCR -Gastrointestinal panel -Influenza PCR -Contact and droplet precautions -Zofran when necessary -Mucinex twice a day -Robitussin when necessary -Repeat CBC/CMET tomorrow morning -Chest x-ray 2 view given subjective worsening shortness of breath and cough  AKI: Creatinine on admission 1.25. Baseline creatinine normal. Likely prerenal in the setting of vomiting and diarrhea. -IVF -Repeat CMET tomorrow  Chronic hypovolemic hyponatremia: Sodium on admission 129. Patient appears to have a history of hyponatremia since March 2018, but she has experienced previous episodes of hyponatremia as low as 125 in 2017. Likely in the setting of hypovolemia. -IVF -Check BMET every 6 hours to ensure hyponatremia does not correct too quickly -Urine sodium , urine osmolality, serum osmolality-may be inaccurate after receiving fluids -Goal sodium level would be less than 137 in 24 hours -At 1900 tonight, if sodium is greater than 133, correcting too quickly  Mechanical mitral valve replacement: Patient is on Coumadin 5 mg on Tuesday and Thursday and 7.5 mg on Monday, Wednesday, Friday, Saturday and Sunday. INR goal is 2.5-3.5. INR 2.01 on admission secondary to inability to keep medications down. Of note, patient was reported to have a history of infective endocarditis in 2014 that was culture negative. The TTE showed a possible vegetation on the mitral valve. However, TEE showed no evidence of vegetation but changes consistent with rheumatic valve  disease. I have reviewed the cardiothoracic notes from the mitral valve replacement and no mention of vegetation is found. 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 -Restart Coumadin -Continue aspirin 81 mg daily -Heparin drip until therapeutic  Elevated AST: AST 54 admission. Previously elevated once prior. Imaging of liver in 2017 was normal. -Check HIV -Check hepatitis C antibody  History of complete heart block: Patient was noted to have an episode of complete heart block while sleeping in 2016 on Holter monitor. This was discussed with electrophysiology who stated pacemaker was not needed given the occurrence during sleep. AV nodal blocking agents have been avoided. -Telemetry  DVT/PE ppx: Heparin and Coumadin FEN: Clear liquid diet Code: Full  Dispo: Admit patient to Observation with expected length of stay less than 2 midnights.  Signed: Martyn Malay, DO PGY-3 Internal Medicine Resident Pager # [phone] 01/11/2017 11:35 AM

## 2017-01-11 NOTE — Progress Notes (Signed)
[facility] for heparin Indication: mechanical MVR  Allergies  Allergen Reactions  . Oxycodone Itching   Patient Measurements: Height: 5\' 2"  (157.5 cm) Weight: 133 lb (60.3 kg) IBW/kg (Calculated) : 50.1 Heparin Dosing Weight: 60.3 kg  Vital Signs: Temp: 98.4 F (36.9 C) (04/18 1631) Temp Source: Oral (04/18 1631) BP: 117/64 (04/18 1631) Pulse Rate: 109 (04/18 1631)  Labs:  Recent Labs  01/11/17 0736 01/11/17 1257 01/11/17 1648  HGB 14.5  --   --   HCT 40.5  --   --   PLT 257  --   --   LABPROT 23.1*  --   --   INR 2.01  --   --   HEPARINUNFRC  --   --  0.22*  CREATININE 1.25* 0.98 0.98    Estimated Creatinine Clearance: 56.2 mL/min (by C-G formula based on SCr of 0.98 mg/dL).  Medications (infusion):  . heparin 850 Units/hr (01/11/17 1302)    Assessment: 56 y.o. female on warfarin PTA for mechanical MVR. Last dose PTA noted to be on 01/10/17. Patient has had nausea and vomiting x3 days. INR 2.01 on admit subtherapeutic. CBC stable WNL. Bridging with heparin since INR subtherapeutic.  Heparin level is subtherapeutic at 0.22 but was drawn early (~3.75 hrs after started). No bleeding noted. Will increase drip rate slightly.  Goal of Therapy:  Heparin level 0.3-0.7 units/ml Monitor platelets by anticoagulation protocol: Yes   Plan:  Increase heparin drip to 950 units/hr 8 hr heparin level Daily heparin level and CBC Monitor for s/sx of bleeding   Renold Genta, PharmD, BCPS Clinical Pharmacist Phone for tonight - [place] - [phone] 01/11/2017 6:57 PM

## 2017-01-12 DIAGNOSIS — Z9114 Patient's other noncompliance with medication regimen: Secondary | ICD-10-CM

## 2017-01-12 DIAGNOSIS — R748 Abnormal levels of other serum enzymes: Secondary | ICD-10-CM

## 2017-01-12 LAB — COMPREHENSIVE METABOLIC PANEL
ALT: 17 U/L (ref 14–54)
ANION GAP: 8 (ref 5–15)
AST: 30 U/L (ref 15–41)
Albumin: 2.6 g/dL — ABNORMAL LOW (ref 3.5–5.0)
Alkaline Phosphatase: 97 U/L (ref 38–126)
BUN: 12 mg/dL (ref 6–20)
CHLORIDE: 106 mmol/L (ref 101–111)
CO2: 21 mmol/L — AB (ref 22–32)
Calcium: 8.4 mg/dL — ABNORMAL LOW (ref 8.9–10.3)
Creatinine, Ser: 0.81 mg/dL (ref 0.44–1.00)
Glucose, Bld: 85 mg/dL (ref 65–99)
POTASSIUM: 4.4 mmol/L (ref 3.5–5.1)
SODIUM: 135 mmol/L (ref 135–145)
Total Bilirubin: 0.6 mg/dL (ref 0.3–1.2)
Total Protein: 5.5 g/dL — ABNORMAL LOW (ref 6.5–8.1)

## 2017-01-12 LAB — BASIC METABOLIC PANEL
Anion gap: 9 (ref 5–15)
BUN: 13 mg/dL (ref 6–20)
CO2: 22 mmol/L (ref 22–32)
CREATININE: 0.97 mg/dL (ref 0.44–1.00)
Calcium: 8.5 mg/dL — ABNORMAL LOW (ref 8.9–10.3)
Chloride: 106 mmol/L (ref 101–111)
GFR calc Af Amer: >60 mL/min (ref >60)
GFR calc non Af Amer: >60 mL/min (ref >60)
Glucose, Bld: 96 mg/dL (ref 65–99)
POTASSIUM: 4.5 mmol/L (ref 3.5–5.1)
SODIUM: 137 mmol/L (ref 135–145)

## 2017-01-12 LAB — CBC
HEMATOCRIT: 35.8 % — AB (ref 36.0–46.0)
HEMOGLOBIN: 12.3 g/dL (ref 12.0–15.0)
MCH: 34.7 pg — AB (ref 26.0–34.0)
MCHC: 34.4 g/dL (ref 30.0–36.0)
MCV: 101.1 fL — AB (ref 78.0–100.0)
Platelets: 239 10*3/uL (ref 150–400)
RBC: 3.54 MIL/uL — AB (ref 3.87–5.11)
RDW: 13.9 % (ref 11.5–15.5)
WBC: 6 10*3/uL (ref 4.0–10.5)

## 2017-01-12 LAB — URINALYSIS, ROUTINE W REFLEX MICROSCOPIC
BILIRUBIN URINE: NEGATIVE
Glucose, UA: NEGATIVE mg/dL
KETONES UR: 5 mg/dL — AB
LEUKOCYTES UA: NEGATIVE
NITRITE: NEGATIVE
PH: 5 (ref 5.0–8.0)
Protein, ur: 100 mg/dL — AB
Specific Gravity, Urine: 1.014 (ref 1.005–1.030)

## 2017-01-12 LAB — SODIUM, URINE, RANDOM: Sodium, Ur: 65 mmol/L

## 2017-01-12 LAB — OSMOLALITY, URINE: Osmolality, Ur: 487 mOsm/kg (ref 300–900)

## 2017-01-12 LAB — PROTIME-INR
INR: 1.52
Prothrombin Time: 18.4 seconds — ABNORMAL HIGH (ref 11.4–15.2)

## 2017-01-12 LAB — HEPARIN LEVEL (UNFRACTIONATED)
HEPARIN UNFRACTIONATED: 0.64 [IU]/mL (ref 0.30–0.70)
Heparin Unfractionated: 0.49 IU/mL (ref 0.30–0.70)

## 2017-01-12 LAB — HEPATITIS C ANTIBODY: HCV Ab: <0.1 s/co ratio (ref 0.0–0.9)

## 2017-01-12 LAB — HIV ANTIBODY (ROUTINE TESTING W REFLEX): HIV SCREEN 4TH GENERATION: NONREACTIVE

## 2017-01-12 MED ORDER — WARFARIN SODIUM 2.5 MG PO TABS
12.5000 mg | ORAL_TABLET | Freq: Once | ORAL | Status: AC
  Administered 2017-01-12: 12.5 mg via ORAL
  Filled 2017-01-12: qty 1

## 2017-01-12 MED ORDER — DEXTROSE 5 % IV SOLN
INTRAVENOUS | Status: AC
  Administered 2017-01-12: 03:00:00 via INTRAVENOUS

## 2017-01-12 MED ORDER — WARFARIN - PHARMACIST DOSING INPATIENT
Freq: Every day | Status: DC
  Administered 2017-01-12: 18:00:00

## 2017-01-12 MED ORDER — ENOXAPARIN SODIUM 60 MG/0.6ML ~~LOC~~ SOLN
60.0000 mg | Freq: Two times a day (BID) | SUBCUTANEOUS | Status: DC
  Administered 2017-01-12 – 2017-01-13 (×2): 60 mg via SUBCUTANEOUS
  Filled 2017-01-12 (×2): qty 0.6

## 2017-01-12 NOTE — Progress Notes (Signed)
Subjective: Patient was feeling better when seen this morning. Denies any more nausea or vomiting, was able to keep liquids and softer diet down. As she was subtherapeutic because of missing Coumadin doses-we told her that she will need some Lovenox injections to go home with Coumadin. She was little reluctant to inject herself.  Objective:  Vital signs in last 24 hours: Vitals:   01/11/17 1631 01/11/17 2118 01/12/17 0400 01/12/17 0920  BP: 117/64 132/83 132/76 128/78  Pulse: (!) 109 98 (!) 106 100  Resp: 16 18 18    Temp: 98.4 F (36.9 C) 98.8 F (37.1 C) 98.4 F (36.9 C) 98.2 F (36.8 C)  TempSrc: Oral Oral Oral Oral  SpO2: 100% 99% 97% 98%  Weight:  130 lb 14.4 oz (59.4 kg)    Height:       Gen. Well-developed, well-nourished lady, in no acute distress. Lungs. Clear bilaterally. CV. Mild tachycardia, mechanical S1,No M/G/R. Abdomen. Soft, nontender, bowel sounds positive. Extremities. No edema, no cyanosis, pulses 2+ bilaterally.  Labs. CBC Latest Ref Rng & Units 01/12/2017 01/11/2017 12/19/2016  WBC 4.0 - 10.5 K/uL 6.0 5.8 10.2  Hemoglobin 12.0 - 15.0 g/dL 12.3 14.5 14.0  Hematocrit 36.0 - 46.0 % 35.8(L) 40.5 39.1  Platelets 150 - 400 K/uL 239 257 391   CMP Latest Ref Rng & Units 01/12/2017 01/12/2017 01/11/2017  Glucose 65 - 99 mg/dL 85 96 81  BUN 6 - 20 mg/dL 12 13 11   Creatinine 0.44 - 1.00 mg/dL 0.81 0.97 0.98  Sodium 135 - 145 mmol/L 135 137 135  Potassium 3.5 - 5.1 mmol/L 4.4 4.5 4.1  Chloride 101 - 111 mmol/L 106 106 107  CO2 22 - 32 mmol/L 21(L) 22 19(L)  Calcium 8.9 - 10.3 mg/dL 8.4(L) 8.5(L) 8.4(L)  Total Protein 6.5 - 8.1 g/dL 5.5(L) - -  Total Bilirubin 0.3 - 1.2 mg/dL 0.6 - -  Alkaline Phos 38 - 126 U/L 97 - -  AST 15 - 41 U/L 30 - -  ALT 14 - 54 U/L 17 - -   INR. 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  Orthostatic hypotension.Although she did had a drop in her systolic blood pressure from 128-114 between lying down and standing-improving from her previous one and she remained asymptomatic and denies any more dizziness. Most likely it was because of dehydration in the setting of nausea and vomiting, which was improved with fluid resuscitation.  Chronic hypovolemic Hyponatremia. As she had hyponatremia at 125 in 2017 too.Most likely because of vomiting and diarrhea. Improved with normal saline. It was 135 this morning which was appropriately corrected. We will stop any more IV fluids. -We'll check BMP in the afternoon.  Probable viral gastroenteritis. Most likely the cause of her acute illness. Nausea vomiting and diarrhea are improving, patient is able to tolerate by mouth intake.  AKI. Dissolved with fluid resuscitation, creatinine within normal limits now.  Mechanical mitral valve replacement. Her INR was subtherapeutic because of missing doses of Coumadin. Initially she was bridged with heparin infusion-later we switched her to Lovenox and Coumadin for convenience and she can be discharged home on Lovenox if needed. Her INR was 1.52 this morning-she never got her evening dose of Coumadin yesterday,  which was restarted today.  Elevated AST: AST 54 on admission. Normalized on subsequent CMP today. HIV and hep C antibodies are negative.  History of complete heart block: Patient was noted to have an episode of complete heart block while sleeping in 2016 on Holter monitor. This was discussed with electrophysiology who stated pacemaker was not needed given the occurrence during sleep. AV nodal blocking agents have been avoided. Currently no reoccurrence.  Dispo: Anticipated discharge in approximately 1 day(s).   Mary Nimrod, MD 01/12/2017, 1:12 PM Pager: [phone]

## 2017-01-12 NOTE — Progress Notes (Signed)
[facility] for heparin Indication: mechanical MVR  Allergies  Allergen Reactions  . Oxycodone Itching   Patient Measurements: Height: 5\' 2"  (157.5 cm) Weight: 130 lb 14.4 oz (59.4 kg) IBW/kg (Calculated) : 50.1 Heparin Dosing Weight: 60.3 kg  Vital Signs: Temp: 98.8 F (37.1 C) (04/18 2118) Temp Source: Oral (04/18 2118) BP: 132/83 (04/18 2118) Pulse Rate: 98 (04/18 2118)  Labs:  Recent Labs  01/11/17 0736 01/11/17 1257 01/11/17 1648 01/12/17 0047 01/12/17 0306  HGB 14.5  --   --   --  12.3  HCT 40.5  --   --   --  35.8*  PLT 257  --   --   --  239  LABPROT 23.1*  --   --   --  18.4*  INR 2.01  --   --   --  1.52  HEPARINUNFRC  --   --  0.22*  --  0.49  CREATININE 1.25* 0.98 0.98 0.97  --     Estimated Creatinine Clearance: 52.4 mL/min (by C-G formula based on SCr of 0.97 mg/dL).  Medications (infusion):  . dextrose    . heparin 950 Units/hr (01/11/17 2018)    Assessment: 56 y.o. female on warfarin PTA for mechanical MVR. Last dose PTA noted to be on 01/10/17. Patient has had nausea and vomiting x3 days. INR 2.01 on admit subtherapeutic. CBC stable WNL. Bridging with heparin since INR subtherapeutic.  Heparin level therapeutic (0.49) on gtt at 950 units/hr. Hgb down to 12.3, no bleeding noted.  Goal of Therapy:  Heparin level 0.3-0.7 units/ml Monitor platelets by anticoagulation protocol: Yes   Plan:  Continue heparin drip at 950 units/hr F/u confirmatory heparin level  Sherlon Handing, PharmD, BCPS Clinical pharmacist, pager [phone] 01/12/2017 4:15 AM

## 2017-01-12 NOTE — Progress Notes (Signed)
Initial Nutrition Assessment  DOCUMENTATION CODES:   Not applicable  INTERVENTION:  Continue Ensure Enlive po BID, each supplement provides 350 kcal and 20 grams of protein.  Encourage adequate PO intake.   NUTRITION DIAGNOSIS:   Malnutrition (severe) related to acute illness (viral gastroenteritis) as evidenced by energy intake < or equal to 50% for > or equal to 5 days, percent weight loss.  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  Diet has been advanced to a soft diet. Meal completion has been 25-80% on her liquid trays. Pt reports appetite has improved and she has been able to tolerate her PO at meals. Pt reports she is ready for her soft diet during time of visit. Pt reports n/v for the 5 days PTA and unable to keep any PO down. She reports consuming juices, water, and Ensure however unable to tolerate them. Pt reports weight loss. Per weight records, pt with a 6.5% weight loss in 1 month. Pt currently has Ensure ordered. RD to continue with current orders. Pt educated to continue with nutritional supplements at home especially if po intake is inadequate. RD expressed understanding.   Pt with no observed significant fat or muscle mass loss.   Diet Order:  DIET SOFT Room service appropriate? Yes; Fluid consistency: Thin  Skin:  Reviewed, no issues  Last BM:  4/19  Height:   Ht Readings from Last 1 Encounters:  01/11/17 5\' 2"  (1.575 m)    Weight:   Wt Readings from Last 1 Encounters:  01/12/17 130 lb (59 kg)     Ideal Body Weight:  50 kg  BMI:  Body mass index is 23.78 kg/m.  Estimated Nutritional Needs:   Kcal:  1700-1900  Protein:  70-80 grams  Fluid:  1.7 - 1.9 L/day  EDUCATION NEEDS:   Education needs addressed  Corrin Parker, MS, RD, LDN Pager # [phone] After hours/ weekend pager # [phone]

## 2017-01-12 NOTE — Progress Notes (Signed)
[facility] for heparin>Lovenox + warfarin Indication: mechanical MVR  Allergies  Allergen Reactions  . Oxycodone Itching   Patient Measurements: Height: 5\' 2"  (157.5 cm) Weight: 130 lb 14.4 oz (59.4 kg) IBW/kg (Calculated) : 50.1 Heparin Dosing Weight: 60.3 kg  Vital Signs: Temp: 98.2 F (36.8 C) (04/19 0920) Temp Source: Oral (04/19 0920) BP: 128/78 (04/19 0920) Pulse Rate: 100 (04/19 0920)  Labs:  Recent Labs  01/11/17 0736  01/11/17 1648 01/12/17 0047 01/12/17 0306  HGB 14.5  --   --   --  12.3  HCT 40.5  --   --   --  35.8*  PLT 257  --   --   --  239  LABPROT 23.1*  --   --   --  18.4*  INR 2.01  --   --   --  1.52  HEPARINUNFRC  --   --  0.22*  --  0.49  CREATININE 1.25*  < > 0.98 0.97 0.81  < > = values in this interval not displayed.  Estimated Creatinine Clearance: 62.8 mL/min (by C-G formula based on SCr of 0.81 mg/dL).  Assessment: 56 y.o. female on warfarin PTA for mechanical MVR. Last dose PTA noted to be on 01/10/17- low INR on admission at 2. Home dose of warfarin 7.5mg  daily except 5mg  on Tuesdays and Thursdays.  Warfarin dose unfortunately missed last evening. INR today dropped to 1.52.  Patient was started on IV heparin, most recent heparin level therapeutic at 0.64units/mL- now to transition to Lovenox for bridging.  Hgb 12.3, plts 239-no bleeding noted.  Goal of Therapy:  Heparin level 0.3-0.7 units/ml INR 2.5-3.5 Monitor platelets by anticoagulation protocol: Yes   Plan:  -Continue heparin drip at 950 units/hr until 1700 this afternoon, then start Lovenox 60mg  (1mg /kg) subQ q12h at 1800 this evening for ease of dosing -Warfarin 12.5mg  po x1 tonight  -Daily INR, CBC q72h at minimum -Watch for s/s bleeding   Mary [name] D. Aleea Hendry, PharmD, BCPS Clinical Pharmacist Pager: [phone] 01/12/2017 12:08 PM

## 2017-01-13 DIAGNOSIS — I5030 Unspecified diastolic (congestive) heart failure: Secondary | ICD-10-CM

## 2017-01-13 DIAGNOSIS — A084 Viral intestinal infection, unspecified: Secondary | ICD-10-CM

## 2017-01-13 DIAGNOSIS — R791 Abnormal coagulation profile: Secondary | ICD-10-CM

## 2017-01-13 LAB — CBC
HEMATOCRIT: 35 % — AB (ref 36.0–46.0)
Hemoglobin: 12 g/dL (ref 12.0–15.0)
MCH: 35 pg — ABNORMAL HIGH (ref 26.0–34.0)
MCHC: 34.3 g/dL (ref 30.0–36.0)
MCV: 102 fL — ABNORMAL HIGH (ref 78.0–100.0)
Platelets: 261 10*3/uL (ref 150–400)
RBC: 3.43 MIL/uL — ABNORMAL LOW (ref 3.87–5.11)
RDW: 14 % (ref 11.5–15.5)
WBC: 6 10*3/uL (ref 4.0–10.5)

## 2017-01-13 LAB — PROTIME-INR
INR: 1.21
Prothrombin Time: 15.3 seconds — ABNORMAL HIGH (ref 11.4–15.2)

## 2017-01-13 MED ORDER — ENOXAPARIN SODIUM 80 MG/0.8ML ~~LOC~~ SOLN
80.0000 mg | Freq: Once | SUBCUTANEOUS | Status: AC
  Administered 2017-01-13: 80 mg via SUBCUTANEOUS
  Filled 2017-01-13: qty 0.8

## 2017-01-13 MED ORDER — ENOXAPARIN SODIUM 60 MG/0.6ML ~~LOC~~ SOLN: 60.0000 mg | Freq: Two times a day (BID) | SUBCUTANEOUS | Status: DC

## 2017-01-13 MED ORDER — WARFARIN SODIUM 2.5 MG PO TABS
12.5000 mg | ORAL_TABLET | Freq: Once | ORAL | Status: AC
  Administered 2017-01-13: 12.5 mg via ORAL
  Filled 2017-01-13: qty 1

## 2017-01-13 MED ORDER — ENOXAPARIN SODIUM 60 MG/0.6ML ~~LOC~~ SOLN: 60.0000 mg | Freq: Two times a day (BID) | SUBCUTANEOUS | 0 refills | Status: DC

## 2017-01-13 NOTE — Progress Notes (Addendum)
ANTICOAGULATION CONSULT NOTE  Pharmacy Consult for Lovenox + warfarin Indication: mechanical MVR  Allergies  Allergen Reactions  . Oxycodone Itching   Patient Measurements: Height: 5\' 2"  (157.5 cm) Weight: 132 lb 4.4 oz (60 kg) IBW/kg (Calculated) : 50.1 Heparin Dosing Weight: 60.3 kg  Vital Signs: Temp: 97.5 F (36.4 C) (04/20 1019) Temp Source: Oral (04/20 1019) BP: 120/98 (04/20 1019) Pulse Rate: 107 (04/20 1019)  Labs:  Recent Labs  01/11/17 0736  01/11/17 1648 01/12/17 0047 01/12/17 0306 01/12/17 1004 01/13/17 0456  HGB 14.5  --   --   --  12.3  --  12.0  HCT 40.5  --   --   --  35.8*  --  35.0*  PLT 257  --   --   --  239  --  261  LABPROT 23.1*  --   --   --  18.4*  --  15.3*  INR 2.01  --   --   --  1.52  --  1.21  HEPARINUNFRC  --   --  0.22*  --  0.49 0.64  --   CREATININE 1.25*  < > 0.98 0.97 0.81  --   --   < > = values in this interval not displayed.  Estimated Creatinine Clearance: 62.8 mL/min (by C-G formula based on SCr of 0.81 mg/dL).  Assessment: 56 y.o. female on warfarin PTA for mechanical MVR. Home dose of warfarin 7.5mg  daily except 5mg  on Tuesdays and Thursdays with last dose taken 4/17- this dose appears to be too low given low admission INR of 2.  Warfarin dose unfortunately missed the evening of 4/18. INR fell further today to 1.21. Transitioned to Lovenox for bridging- noted patient's reluctance, and now acceptance of administering Lovenox injections herself at home.  Hgb 12, plts 261-no bleeding noted.  Goal of Therapy:  Anti-Xa level 0.6-1 units/ml 4hrs after LMWH dose given INR 2.5-3.5 Monitor platelets by anticoagulation protocol: Yes   Plan:  -Lovenox 60mg  subQ q12h- despite being two injections daily, this will likely be easier for patient given the dose is a full syringe (once daily dosing would require her to only administer a partial syringe which can be difficult for most patients) -Recommend Warfarin 12.5mg  po x1 tonight  (dose entered- can be given prior to discharge if patient is discharged today), then warfarin 10mg  po on 4/21 and 4/22 with an INR check on Monday 4/23  -Daily INR, CBC q72h at minimum -Watch for s/s bleeding   Yatziri Wainwright D. Berda Shelvin, PharmD, BCPS Clinical Pharmacist Pager: [phone] 01/13/2017 10:38 AM   ADDENDUM Discussed with RN Jonni Sanger as plan is for patient to discharge today. For reasons mentioned above and more predictable kinetics with BID dosing, recommend keeping her on Lovenox 60mg  subQ BID.  Orders entered for Lovenox 80mg  subQ x1 to be given prior to discharge- larger dose in order to cover patient until being able to resume Lovenox 60mg  subQ BID starting 4/21 at 0600 (at home)  Ander Purpura D. Kymber Kosar, PharmD, BCPS Clinical Pharmacist Pager: [phone] 01/13/2017 11:35 AM

## 2017-01-13 NOTE — Progress Notes (Signed)
Called to patients room to discuss patient care concerns. Listened to patients concerns. Discussed again with patient her the need to discharge on Lovenox. Patient stated "at first I didn't want to do it but now I will. I don't want to have to keep coming to the hospital for the medicine. I am ready to go home right now!" Dr. Reesa Chew aware. RN made aware of patient need for additional Lovenox teaching prior to discharge. Shamecka Hocutt, [place]

## 2017-01-13 NOTE — Discharge Instructions (Signed)

## 2017-01-13 NOTE — Care Management Note (Signed)
Case Management Note  Patient Details  Name: BENNA ARNO MRN: [id number] Date of Birth: [birth year]  Subjective/Objective:      CM following for progression and d/c planning.               Action/Plan: 01/13/2017 Pt for d/c to home no HH or DME needs. Pt will d/c on Lovenox x 7days. Pt instructed on administration and MATCH letter provided to assist with purchase of Lovenox. Pt will followup with internal medicine clinic per notes.   Expected Discharge Date:  01/13/17               Expected Discharge Plan:  Home/Self Care  In-House Referral:  NA  Discharge planning Services  CM Consult, [facility]  Post Acute Care Choice:  NA Choice offered to:  NA  DME Arranged:   NA DME Agency:   NA  HH Arranged:   NA HH Agency:   NA  Status of Service:  Completed, signed off  If discussed at [place] of Stay Meetings, dates discussed:    Additional Comments:  Adron Bene, RN 01/13/2017, 1:26 PM

## 2017-01-13 NOTE — Progress Notes (Signed)
Asked by Dr. Reesa Chew to speak with patient regarding administration of Lovenox at home subcutaneously. Patient had previously refused lovenox self-admin and was at risk for clot d/t hx of mechanical heart valve. Patient had verbalized that she felt hospital staff would be responsible if she had an adverse event despite her unwillingness to administer medication.  Met with patient to educate her regarding the differences in lovenox, heparin, and coumadin and to investigate possibilities for compliance. Patient denied having family locally to [facility] medicine and said that she was not comfortable with 'non-medical' friends administering 'a shot'. Patient is ok with medical staff administering the lovenox and is not opposed to receiving injections. She has received lovenox injections while inpatient without problem. Patient is only partially able to teach back the reason for heparin, lovenox, and bridging to coumadin. This explained and re-taught by multiple individuals. Patient blocks conversations regarding these medications and says that she 'doesn't need another medicine', she 'didn't come here for her coumadin, I came here for my nausea and vomiting'.  Patient would agree to accept lovenox prescription and agreed to come to emergency room over the weekend to have lovenox administered. She goes to the coumadin clinic and says that she previously had an appointment scheduled for Tuesday. We discussed going on Monday to have the coumadin clinic check her INR and administer the lovenox. She agreed to this. Dr. Reesa Chew called and confirmed that this was an acceptable follow up plan.

## 2017-01-13 NOTE — Discharge Summary (Signed)
Name: Mary [name] MRN: [id number] DOB: 1960/11/08 56 y.o. PCP: No Pcp Per Patient  Date of Admission: 01/11/2017  6:42 AM Date of Discharge: 01/16/2017 Attending Physician: No att. providers found  Discharge Diagnosis: 1. Orthostatic hypotension. 2. Viral gastroenteritis.  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   enoxaparin 60 MG/0.6ML injection Commonly known as:  LOVENOX Inject 0.6 mLs (60 mg total) into the skin every 12 (twelve) hours.   warfarin 5 MG tablet Commonly known as:  COUMADIN Take as directed by coumadin clinic What changed:  how much to take  how to take this  when to take this  additional instructions   WITCH HAZEL EX Apply 1 application topically at bedtime as needed (knee pain).       Disposition and follow-up:   Ms.Mary [name] was discharged from [facility] in Good condition.  At the hospital follow up visit please address:  1.  She needs a close follow-up at Coumadin clinic for optimal INR of 2.5-3.5. She was discharged home on Lovenox as a bridge.  2.  Labs / imaging needed at time of follow-up: INR  3.  Pending labs/ test needing follow-up: None  Follow-up Appointments:   Hospital Course by problem list:  Ms. 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  Orthostatic hypotension. On presentation she was found to be orthostatic vitals positive and was symptomatic. Her blood pressure and orthostasis improved with fluid resuscitation and cessation of nausea and vomiting. She remained asymptomatic for more than 24 hour before discharge.  Probable viral gastroenteritis. Her main complaint was nausea and vomiting and diarrhea causing positive orthostatic vitals. Her symptoms resolved and she was able to take by mouth intake very well on discharge.  Mechanical mitral valve replacement.Her INR was subtherapeutic because of missing doses of Coumadin due to nausea and vomiting.Initially she was bridged with heparin infusion-later we switched her to Lovenox and Coumadin for convenience and she was discharged home on Lovenox bridge to Coumadin . Initially patient was very reluctant to do Lovenox herself at home, later agreed to inject herself over the weekend. She will go to Coumadin clinic early next week most likely on Tuesday. She was discharged home with Lovenox 60 milligrams Q 12 along with coumadin. Her dose can be adjusted at Coumadin clinic. On discharge date her INR was 1.21. Goal INR for her is 2.5-3.5.  Chronic hypovolemic hyponatremia: Sodium on admission 129. Patient appears to have a history of hyponatremia since March 2018, but she has experienced previous episodes of hyponatremia as low as 125 in 2017. Likely in the setting of  hypovolemia. Her sodium improved to 135 over more than 24 hour with fluid resuscitation and resolution of her symptoms.  AKI.  Creatinine on admission 1.25. Baseline creatinine normal. Likely prerenal in the setting of vomiting and diarrhea. It was resolved with fluid resuscitation.  History of complete heart block: Patient was  noted to have an episode of complete heart block while sleeping in 2016 on Holter monitor. This was discussed with electrophysiology who stated pacemaker was not needed given the occurrence during sleep. AV nodal blocking agents have been avoided. She remained asymptomatic.  Discharge Vitals:   BP (!) 120/98 (BP Location: Right Leg) Comment: notified nurse  Pulse (!) 107   Temp 97.5 F (36.4 C) (Oral)   Resp 17   Ht 5\' 2"  (1.575 m)   Wt 132 lb 4.4 oz (60 kg)   LMP 03/22/2013   SpO2 100%   BMI 24.19 kg/m   Gen.Well-developed, well-nourished lady, in no acute distress. Little upset and agitated. Lungs.Clear bilaterally. CV.Mild tachycardia,mechanical S1,No M/G/R. Abdomen.Soft, nontender, bowel sounds positive. Extremities.No edema, no cyanosis, pulses 2+ bilaterally.  Pertinent Labs, Studies, and Procedures:  CBC Latest Ref Rng & Units 01/13/2017 01/12/2017 01/11/2017  WBC 4.0 - 10.5 K/uL 6.0 6.0 5.8  Hemoglobin 12.0 - 15.0 g/dL 12.0 12.3 14.5  Hematocrit 36.0 - 46.0 % 35.0(L) 35.8(L) 40.5  Platelets 150 - 400 K/uL 261 239 257   CMP Latest Ref Rng & Units 01/12/2017 01/12/2017 01/11/2017  Glucose 65 - 99 mg/dL 85 96 81  BUN 6 - 20 mg/dL 12 13 11   Creatinine 0.44 - 1.00 mg/dL 0.81 0.97 0.98  Sodium 135 - 145 mmol/L 135 137 135  Potassium 3.5 - 5.1 mmol/L 4.4 4.5 4.1  Chloride 101 - 111 mmol/L 106 106 107  CO2 22 - 32 mmol/L 21(L) 22 19(L)  Calcium 8.9 - 10.3 mg/dL 8.4(L) 8.5(L) 8.4(L)  Total Protein 6.5 - 8.1 g/dL 5.5(L) - -  Total Bilirubin 0.3 - 1.2 mg/dL 0.6 - -  Alkaline Phos 38 - 126 U/L 97 - -  AST 15 - 41 U/L 30 - -  ALT 14 - 54 U/L 17 - -   Urinalysis    Component Value Date/Time   COLORURINE YELLOW 01/11/2017 0820   APPEARANCEUR HAZY (A) 01/11/2017 0820   LABSPEC 1.014 01/11/2017 0820   PHURINE 5.0 01/11/2017 0820   GLUCOSEU NEGATIVE 01/11/2017 0820   HGBUR LARGE (A) 01/11/2017 0820   BILIRUBINUR NEGATIVE 01/11/2017 0820   KETONESUR 5 (A) 01/11/2017 0820    PROTEINUR 100 (A) 01/11/2017 0820   UROBILINOGEN 0.2 06/04/2015 1533   NITRITE NEGATIVE 01/11/2017 0820   LEUKOCYTESUR NEGATIVE 01/11/2017 0820   Gastrointestinal Panel by PCR , Stool  Order: [id number]  Status:  Final result Visible to patient:  No (Not Released) Next appt:  01/17/2017 at 07:45 AM in Cardiology ([facility])   Ref Range & Units 5d ago  Campylobacter species NOT DETECTED NOT DETECTED   Plesimonas shigelloides NOT DETECTED NOT DETECTED   Salmonella species NOT DETECTED NOT DETECTED   Yersinia enterocolitica NOT DETECTED NOT DETECTED   Vibrio species NOT DETECTED NOT DETECTED   Vibrio cholerae NOT DETECTED NOT DETECTED   Enteroaggregative E coli (EAEC) NOT DETECTED NOT DETECTED   Enteropathogenic E coli (EPEC) NOT DETECTED NOT DETECTED   Enterotoxigenic E coli (ETEC) NOT DETECTED NOT DETECTED   Shiga like toxin producing E coli (STEC) NOT DETECTED NOT DETECTED   Shigella/Enteroinvasive E coli (EIEC) NOT DETECTED NOT DETECTED  Cryptosporidium NOT DETECTED NOT DETECTED   Cyclospora cayetanensis NOT DETECTED NOT DETECTED   Entamoeba histolytica NOT DETECTED NOT DETECTED   Giardia lamblia NOT DETECTED NOT DETECTED   Adenovirus F40/41 NOT DETECTED NOT DETECTED   Astrovirus NOT DETECTED NOT DETECTED   Norovirus GI/GII NOT DETECTED NOT DETECTED   Rotavirus A NOT DETECTED NOT DETECTED   Sapovirus (I, II, IV, and V) NOT DETECTED NOT DETECTED   Resulting Agency  [facility]       Influenza PCR. Negative Lipase. 25 Urine osmolality. 487 Urine sodium.65 Serum osmolality. 279 HIV antibody. Nonreactive Hep C antibody.<0.1 INR. 2.01>>1.52>>1.21  DG chest.FINDINGS: The heart size and mediastinal contours are within normal limits. Previous mitral and tricuspid valve replacements again noted. Both lungs are clear. Both lungs are clear. The visualized skeletal structures are unremarkable.  IMPRESSION: No active cardiopulmonary disease.  Discharge  Instructions: Discharge Instructions    Diet - low sodium heart healthy    Complete by:  As directed    Discharge instructions    Complete by:  As directed    It was pleasure taking care of you. Please inject yourself with Lovenox twice daily as directed, till you go for your Coumadin clinic on Tuesday. Take your Coumadin as directed and your dose will be adjusted according to your INR in Coumadin Clinic.   Increase activity slowly    Complete by:  As directed       Signed: [name], MD 01/16/2017, 6:32 PM   Pager: [phone]

## 2017-01-13 NOTE — Progress Notes (Signed)
   Subjective: Patient was feeling better this morning. She denies any more nausea or vomiting was able to tolerate regular diet very well. Initially patient was very upset that she never came for her INR and why we are giving her Lovenox shots along with Coumadin and she was not willing to take them herself when she goes home. It took [place] little long time to explain the reason and nursing supervisor got involved, eventually patient agreed to take Lovenox over the weekend until she will go to Coumadin clinic early next week.  0bjective:  Vital signs in last 24 hours: Vitals:   01/12/17 1650 01/12/17 2100 01/13/17 0641 01/13/17 1019  BP: (!) 153/86 (!) 134/111 136/79 (!) 120/98  Pulse: 99 91 93 (!) 107  Resp: 18 17 17    Temp: 98.2 F (36.8 C) 98.4 F (36.9 C) 98.3 F (36.8 C) 97.5 F (36.4 C)  TempSrc: Oral Oral Oral Oral  SpO2: 98% 100% 99% 100%  Weight:  132 lb 4.4 oz (60 kg)    Height:       Gen. Well-developed, well-nourished lady, in no acute distress. Little upset and agitated. Lungs. Clear bilaterally. CV. Mild tachycardia, mechanical S1,No M/G/R. Abdomen. Soft, nontender, bowel sounds positive. Extremities. No edema, no cyanosis, pulses 2+ bilaterally.  Labs. INR. 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  Orthostatic hypotension.And patient remained asymptomatic over last 24 hour.  Mechanical mitral valve replacement. Her INR was subtherapeutic because of missing doses of  Coumadin. Initially she was bridged with heparin infusion-later we switched her to Lovenox and Coumadin for convenience and she can be discharged home on Lovenox if needed. Initially patient was very reluctant to do Lovenox herself at home later to inject herself over the weekend. She will go to Coumadin clinic early next week most likely on Tuesday.  Her INR Today was 1.21  -She is being discharged home with Lovenox 60 mg Q 12 along with coumadin till she will go to Coumadin clinic and they can adjust as dosage. Goal INR for her is 2.5-3.5  Chronic hypovolemic Hyponatremia. As she had hyponatremia at 125 in 2017 too. Currently sodium is within normal limit.  Probable viral gastroenteritis. Resolved  AKI. Most likely prerenal, resolved with fluid resuscitation.  History of complete heart block: Patient was noted to have an episode of complete heart block while sleeping in 2016 on Holter monitor. This was discussed with electrophysiology who stated pacemaker was not needed given the occurrence during sleep. AV nodal blocking agents have been avoided. Currently no reoccurrence.   Dispo: Being discharged today.  Mary Nimrod, MD 01/13/2017, 10:45 AM Pager: [phone]

## 2017-01-13 NOTE — Progress Notes (Signed)
Patient discharge teaching given, including activity, diet, follow-up appoints, Lovenox self administration, and medications. Patient verbalized understanding of all discharge instructions. IV access was d/c'd. Vitals are stable. Skin is intact except as charted in most recent assessments. Pt to be escorted out by NT, to be driven home by family.  Jillyn Ledger, MBA, BSN, RN

## 2017-01-17 ENCOUNTER — Ambulatory Visit (INDEPENDENT_AMBULATORY_CARE_PROVIDER_SITE_OTHER): Payer: Self-pay | Admitting: *Deleted

## 2017-01-17 DIAGNOSIS — I059 Rheumatic mitral valve disease, unspecified: Secondary | ICD-10-CM

## 2017-01-17 DIAGNOSIS — Z7901 Long term (current) use of anticoagulants: Secondary | ICD-10-CM

## 2017-01-17 LAB — POCT INR: INR: 5.6

## 2017-01-24 ENCOUNTER — Ambulatory Visit (INDEPENDENT_AMBULATORY_CARE_PROVIDER_SITE_OTHER): Payer: Self-pay

## 2017-01-24 DIAGNOSIS — Z7901 Long term (current) use of anticoagulants: Secondary | ICD-10-CM

## 2017-01-24 DIAGNOSIS — I059 Rheumatic mitral valve disease, unspecified: Secondary | ICD-10-CM

## 2017-01-24 LAB — POCT INR: INR: 3.3

## 2017-02-08 ENCOUNTER — Ambulatory Visit (INDEPENDENT_AMBULATORY_CARE_PROVIDER_SITE_OTHER): Payer: Self-pay | Admitting: *Deleted

## 2017-02-08 DIAGNOSIS — I059 Rheumatic mitral valve disease, unspecified: Secondary | ICD-10-CM

## 2017-02-08 DIAGNOSIS — Z7901 Long term (current) use of anticoagulants: Secondary | ICD-10-CM

## 2017-02-08 LAB — POCT INR: INR: 2

## 2017-02-22 ENCOUNTER — Ambulatory Visit (INDEPENDENT_AMBULATORY_CARE_PROVIDER_SITE_OTHER): Payer: Self-pay | Admitting: Pharmacist

## 2017-02-22 DIAGNOSIS — Z7901 Long term (current) use of anticoagulants: Secondary | ICD-10-CM

## 2017-02-22 DIAGNOSIS — I059 Rheumatic mitral valve disease, unspecified: Secondary | ICD-10-CM

## 2017-02-22 LAB — POCT INR: INR: 5.5

## 2017-03-08 ENCOUNTER — Ambulatory Visit (INDEPENDENT_AMBULATORY_CARE_PROVIDER_SITE_OTHER): Payer: Self-pay | Admitting: *Deleted

## 2017-03-08 DIAGNOSIS — I059 Rheumatic mitral valve disease, unspecified: Secondary | ICD-10-CM

## 2017-03-08 DIAGNOSIS — Z7901 Long term (current) use of anticoagulants: Secondary | ICD-10-CM

## 2017-03-08 LAB — POCT INR: INR: 2.5

## 2017-04-03 ENCOUNTER — Ambulatory Visit (INDEPENDENT_AMBULATORY_CARE_PROVIDER_SITE_OTHER): Payer: Self-pay

## 2017-04-03 DIAGNOSIS — I059 Rheumatic mitral valve disease, unspecified: Secondary | ICD-10-CM

## 2017-04-03 DIAGNOSIS — Z7901 Long term (current) use of anticoagulants: Secondary | ICD-10-CM

## 2017-04-03 LAB — POCT INR: INR: 3.4

## 2017-04-18 ENCOUNTER — Encounter (HOSPITAL_COMMUNITY): Payer: Self-pay | Admitting: *Deleted

## 2017-04-18 ENCOUNTER — Observation Stay (HOSPITAL_COMMUNITY)
Admission: EM | Admit: 2017-04-18 | Discharge: 2017-04-19 | Disposition: A | Discharge status: Home / Self Care | Payer: Self-pay | Attending: Family Medicine | Admitting: Family Medicine

## 2017-04-18 DIAGNOSIS — K922 Gastrointestinal hemorrhage, unspecified: Secondary | ICD-10-CM

## 2017-04-18 DIAGNOSIS — R011 Cardiac murmur, unspecified: Secondary | ICD-10-CM | POA: Insufficient documentation

## 2017-04-18 DIAGNOSIS — Z8719 Personal history of other diseases of the digestive system: Secondary | ICD-10-CM | POA: Insufficient documentation

## 2017-04-18 DIAGNOSIS — E860 Dehydration: Secondary | ICD-10-CM | POA: Insufficient documentation

## 2017-04-18 DIAGNOSIS — I110 Hypertensive heart disease with heart failure: Secondary | ICD-10-CM | POA: Insufficient documentation

## 2017-04-18 DIAGNOSIS — A084 Viral intestinal infection, unspecified: Principal | ICD-10-CM | POA: Insufficient documentation

## 2017-04-18 DIAGNOSIS — Z7982 Long term (current) use of aspirin: Secondary | ICD-10-CM | POA: Insufficient documentation

## 2017-04-18 DIAGNOSIS — N179 Acute kidney failure, unspecified: Secondary | ICD-10-CM | POA: Insufficient documentation

## 2017-04-18 DIAGNOSIS — Z7901 Long term (current) use of anticoagulants: Secondary | ICD-10-CM | POA: Insufficient documentation

## 2017-04-18 DIAGNOSIS — J45909 Unspecified asthma, uncomplicated: Secondary | ICD-10-CM | POA: Insufficient documentation

## 2017-04-18 DIAGNOSIS — K219 Gastro-esophageal reflux disease without esophagitis: Secondary | ICD-10-CM | POA: Insufficient documentation

## 2017-04-18 DIAGNOSIS — E871 Hypo-osmolality and hyponatremia: Secondary | ICD-10-CM | POA: Diagnosis present

## 2017-04-18 DIAGNOSIS — K921 Melena: Secondary | ICD-10-CM | POA: Insufficient documentation

## 2017-04-18 DIAGNOSIS — I471 Supraventricular tachycardia: Secondary | ICD-10-CM | POA: Insufficient documentation

## 2017-04-18 DIAGNOSIS — I5032 Chronic diastolic (congestive) heart failure: Secondary | ICD-10-CM | POA: Insufficient documentation

## 2017-04-18 DIAGNOSIS — Z952 Presence of prosthetic heart valve: Secondary | ICD-10-CM | POA: Insufficient documentation

## 2017-04-18 DIAGNOSIS — I69354 Hemiplegia and hemiparesis following cerebral infarction affecting left non-dominant side: Secondary | ICD-10-CM | POA: Insufficient documentation

## 2017-04-18 DIAGNOSIS — Z87891 Personal history of nicotine dependence: Secondary | ICD-10-CM | POA: Insufficient documentation

## 2017-04-18 DIAGNOSIS — I081 Rheumatic disorders of both mitral and tricuspid valves: Secondary | ICD-10-CM | POA: Insufficient documentation

## 2017-04-18 DIAGNOSIS — Z885 Allergy status to narcotic agent status: Secondary | ICD-10-CM | POA: Insufficient documentation

## 2017-04-18 DIAGNOSIS — I442 Atrioventricular block, complete: Secondary | ICD-10-CM | POA: Insufficient documentation

## 2017-04-18 DIAGNOSIS — R55 Syncope and collapse: Secondary | ICD-10-CM

## 2017-04-18 DIAGNOSIS — K625 Hemorrhage of anus and rectum: Secondary | ICD-10-CM

## 2017-04-18 LAB — COMPREHENSIVE METABOLIC PANEL
ALK PHOS: 114 U/L (ref 38–126)
ALT: 14 U/L (ref 14–54)
ANION GAP: 9 (ref 5–15)
AST: 31 U/L (ref 15–41)
Albumin: 3.6 g/dL (ref 3.5–5.0)
BILIRUBIN TOTAL: 0.3 mg/dL (ref 0.3–1.2)
BUN: 14 mg/dL (ref 6–20)
CALCIUM: 8.9 mg/dL (ref 8.9–10.3)
CO2: 19 mmol/L — ABNORMAL LOW (ref 22–32)
CREATININE: 1.28 mg/dL — AB (ref 0.44–1.00)
Chloride: 93 mmol/L — ABNORMAL LOW (ref 101–111)
GFR, EST AFRICAN AMERICAN: 54 mL/min — AB (ref >60)
GFR, EST NON AFRICAN AMERICAN: 47 mL/min — AB (ref >60)
Glucose, Bld: 80 mg/dL (ref 65–99)
Potassium: 5 mmol/L (ref 3.5–5.1)
Sodium: 121 mmol/L — ABNORMAL LOW (ref 135–145)
TOTAL PROTEIN: 6.6 g/dL (ref 6.5–8.1)

## 2017-04-18 LAB — I-STAT TROPONIN, ED: Troponin i, poc: 0.01 ng/mL (ref 0.00–0.08)

## 2017-04-18 LAB — BASIC METABOLIC PANEL
Anion gap: 7 (ref 5–15)
BUN: 14 mg/dL (ref 6–20)
CALCIUM: 8.6 mg/dL — AB (ref 8.9–10.3)
CO2: 21 mmol/L — ABNORMAL LOW (ref 22–32)
CREATININE: 1.19 mg/dL — AB (ref 0.44–1.00)
Chloride: 101 mmol/L (ref 101–111)
GFR, EST AFRICAN AMERICAN: 59 mL/min — AB (ref >60)
GFR, EST NON AFRICAN AMERICAN: 51 mL/min — AB (ref >60)
Glucose, Bld: 92 mg/dL (ref 65–99)
Potassium: 4.4 mmol/L (ref 3.5–5.1)
SODIUM: 129 mmol/L — AB (ref 135–145)

## 2017-04-18 LAB — TYPE AND SCREEN
ABO/RH(D): O POS
Antibody Screen: NEGATIVE

## 2017-04-18 LAB — CBC
HEMATOCRIT: 37.5 % (ref 36.0–46.0)
Hemoglobin: 13.4 g/dL (ref 12.0–15.0)
MCH: 34.3 pg — ABNORMAL HIGH (ref 26.0–34.0)
MCHC: 35.7 g/dL (ref 30.0–36.0)
MCV: 95.9 fL (ref 78.0–100.0)
PLATELETS: 353 10*3/uL (ref 150–400)
RBC: 3.91 MIL/uL (ref 3.87–5.11)
RDW: 12.6 % (ref 11.5–15.5)
WBC: 8.1 10*3/uL (ref 4.0–10.5)

## 2017-04-18 LAB — POC OCCULT BLOOD, ED: FECAL OCCULT BLD: POSITIVE — AB

## 2017-04-18 LAB — URINALYSIS, ROUTINE W REFLEX MICROSCOPIC
BILIRUBIN URINE: NEGATIVE
GLUCOSE, UA: NEGATIVE mg/dL
KETONES UR: NEGATIVE mg/dL
NITRITE: NEGATIVE
PH: 6 (ref 5.0–8.0)
PROTEIN: 100 mg/dL — AB
Specific Gravity, Urine: 1.003 — ABNORMAL LOW (ref 1.005–1.030)

## 2017-04-18 LAB — PROTIME-INR
INR: 2.56
PROTHROMBIN TIME: 28 s — AB (ref 11.4–15.2)

## 2017-04-18 MED ORDER — ONDANSETRON HCL 4 MG/2ML IJ SOLN
4.0000 mg | Freq: Four times a day (QID) | INTRAMUSCULAR | Status: DC | PRN
Start: 2017-04-18 — End: 2017-04-19

## 2017-04-18 MED ORDER — SODIUM CHLORIDE 0.9 % IV BOLUS (SEPSIS)
1000.0000 mL | Freq: Once | INTRAVENOUS | Status: AC
  Administered 2017-04-18: 1000 mL via INTRAVENOUS

## 2017-04-18 MED ORDER — WARFARIN SODIUM 7.5 MG PO TABS: 7.5000 mg | ORAL_TABLET | ORAL | Status: DC

## 2017-04-18 MED ORDER — SODIUM CHLORIDE 0.9 % IV SOLN
INTRAVENOUS | Status: DC
  Administered 2017-04-18 – 2017-04-19 (×2): via INTRAVENOUS

## 2017-04-18 MED ORDER — WARFARIN SODIUM 5 MG PO TABS: 5.0000 mg | ORAL_TABLET | Freq: Every day | ORAL | Status: DC

## 2017-04-18 MED ORDER — ASPIRIN EC 81 MG PO TBEC
81.0000 mg | DELAYED_RELEASE_TABLET | Freq: Every day | ORAL | Status: DC
  Administered 2017-04-19: 81 mg via ORAL
  Filled 2017-04-18: qty 1

## 2017-04-18 MED ORDER — ACETAMINOPHEN 325 MG PO TABS: 650.0000 mg | ORAL_TABLET | Freq: Four times a day (QID) | ORAL | Status: DC | PRN

## 2017-04-18 MED ORDER — ONDANSETRON HCL 4 MG PO TABS: 4.0000 mg | ORAL_TABLET | Freq: Four times a day (QID) | ORAL | Status: DC | PRN

## 2017-04-18 MED ORDER — ACETAMINOPHEN 650 MG RE SUPP: 650.0000 mg | Freq: Four times a day (QID) | RECTAL | Status: DC | PRN

## 2017-04-18 MED ORDER — WARFARIN SODIUM 5 MG PO TABS
5.0000 mg | ORAL_TABLET | ORAL | Status: DC
  Administered 2017-04-18: 5 mg via ORAL
  Filled 2017-04-18: qty 1

## 2017-04-18 MED ORDER — ONDANSETRON HCL 4 MG/2ML IJ SOLN
4.0000 mg | Freq: Once | INTRAMUSCULAR | Status: AC
  Administered 2017-04-18: 4 mg via INTRAVENOUS
  Filled 2017-04-18: qty 2

## 2017-04-18 MED ORDER — WARFARIN - PHYSICIAN DOSING INPATIENT
Freq: Every day | Status: DC
Start: 2017-04-18 — End: 2017-04-19

## 2017-04-18 NOTE — ED Notes (Signed)
Pt has left sided weakness at baseline from a previous stroke.

## 2017-04-18 NOTE — ED Provider Notes (Signed)
[facility] DEPT Provider Note   CSN: [id number] Arrival date & time: 04/18/17  1047     History   Chief Complaint Chief Complaint  Patient presents with  . Near Syncope  . Rectal Bleeding    HPI Mary [name] is a 56 y.o. female.  Patient with history of heart valve replacement currently on Coumadin, SVT, stroke -- presents with several days of non-bloody vomiting and nonbloody diarrhea. Patient has been having intermittent lightheaded episodes which have been ongoing for a while but worsened recently. Symptoms occur at any time. She has had tunnel vision but no full syncope. No associated chest pain or shortness of breath but has felt clammy with episodes. Patient also c/o rectal pain and some bright red blood per rectum from pre-existing hemorrhoids. No treatments prior to arrival. The onset of this condition was acute. The course is constant. Aggravating factors: none. Alleviating factors: none.        Past Medical History:  Diagnosis Date  . Acute diastolic heart failure ([place]) 03/15/2013  . Anemia   . Carotid stenosis    Carotid US 2/17: bilateral ICA 1-39% >> FU prn  . Childhood asthma   . Complete heart block ([place]) 05/28/2013   Post-op  . Epigastric hernia 200's  . GERD (gastroesophageal reflux disease)   . Heart murmur   . History of blood transfusion    "14 w/1st pregnancy; 2 w/last C-section" (03/15/2013)  . Hx of echocardiogram    a. Echo 4/16:  Mild focal basal septal hypertrophy, EF 60-65%, no RWMA, Mechanical MVR ok with mild central regurgitation and no perivalvular leak, small mobile density attached to valvular ring (1.5x1 cm) - post surgical changes vs SBE, mild LAE;   b. Echo 2/17:  Mild post wall LVH, EF 55-60%, no RWMA, mechanical MVR ok (mean 5 mmHg), normal RVSF  . Hypertension    no pcp   will go to [facility]  . 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 S/P mitral valve replacement with metallic valve 6/60/6301   60mm Sorin [employer]  mechanical prosthesis via right mini thoracotomy approach  . 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 Severe mitral regurgitation 04/22/2013  . Stroke ([place])   . SVT (supraventricular tachycardia) ([place]) 03/16/2013  . Tricuspid regurgitation     Patient Active Problem List   Diagnosis Date Noted  . Orthostatic hypotension 01/11/2017  . Chronic hyponatremia 01/11/2017  . Elevated liver enzymes 01/11/2017  . AKI (acute kidney injury) ([place]) 01/10/2016  . Subtherapeutic international normalized ratio (INR) 01/10/2016  . TIA (transient ischemic attack) 09/13/2015  . History of rheumatic heart disease 09/13/2015  . CVA (cerebral vascular accident) ([place]) 09/12/2015  . First degree heart block 06/17/2013  . Weakness 06/16/2013  . (HFpEF) heart failure with preserved ejection fraction ([place]) 06/16/2013  . Anemia 06/16/2013  . History of bacterial endocarditis 06/16/2013  . Heart valve replaced by other means 06/10/2013  . Long term (current) use of anticoagulants 06/10/2013  . History of complete heart block 05/28/2013  . S/P minimally-invasive mitral valve replacement with mechanical valve and tricuspid valve repair 05/23/2013  . S/P minimally-invasive tricuspid valve repair 05/23/2013  . Femoral hernia 05/23/2013  . Severe mitral regurgitation 04/22/2013  . Mitral valve disorders(424.0) 04/22/2013  . Tricuspid regurgitation 04/22/2013  . 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 SVT (supraventricular tachycardia) ([place])  03/16/2013    Past Surgical History:  Procedure Laterality Date  . APPENDECTOMY  [id number]  . CARDIAC CATHETERIZATION    . CARDIAC VALVE REPLACEMENT    . CESAREAN SECTION  1983  . CESAREAN SECTION WITH BILATERAL TUBAL LIGATION  1999  . FEMORAL HERNIA REPAIR Right 05/23/2013    Procedure: HERNIA REPAIR FEMORAL;  Surgeon: Rexene Alberts, MD;  Location: [facility];  Service: Open Heart Surgery;  Laterality: Right;  . INTRAOPERATIVE TRANSESOPHAGEAL ECHOCARDIOGRAM N/A 05/23/2013   Procedure: INTRAOPERATIVE TRANSESOPHAGEAL ECHOCARDIOGRAM;  Surgeon: Rexene Alberts, MD;  Location: [facility];  Service: Open Heart Surgery;  Laterality: N/A;  . LAPAROSCOPIC CHOLECYSTECTOMY  2003  . LEFT AND RIGHT HEART CATHETERIZATION WITH CORONARY ANGIOGRAM N/A 03/22/2013   Procedure: LEFT AND RIGHT HEART CATHETERIZATION WITH CORONARY ANGIOGRAM;  Surgeon: Burnell Blanks, MD;  Location: [facility] CATH LAB;  Service: Cardiovascular;  Laterality: N/A;  . MINIMALLY INVASIVE TRICUSPID VALVE REPAIR Right 05/23/2013   Procedure: MINIMALLY INVASIVE TRICUSPID VALVE REPAIR;  Surgeon: Rexene Alberts, MD;  Location: [facility];  Service: Open Heart Surgery;  Laterality: Right;  . MITRAL VALVE REPLACEMENT N/A 05/23/2013   Procedure: MITRAL VALVE (MV) REPLACEMENT;  Surgeon: Rexene Alberts, MD;  Location: [facility];  Service: Open Heart Surgery;  Laterality: N/A;  . MULTIPLE EXTRACTIONS WITH ALVEOLOPLASTY N/A 04/04/2013   Procedure: Extraction of tooth #'s 1,8,9,13,14,15,23,24,25,26 with alveoloplasty and gross debridement of remaining teeth;  Surgeon: Lenn Cal, DDS;  Location: [facility];  Service: Oral Surgery;  Laterality: N/A;  . TEE WITHOUT CARDIOVERSION N/A 03/18/2013   Procedure: TRANSESOPHAGEAL ECHOCARDIOGRAM (TEE);  Surgeon: Larey Dresser, MD;  Location: [facility];  Service: Cardiovascular;  Laterality: N/A;  . TEE WITHOUT CARDIOVERSION N/A 06/17/2013   Procedure: TRANSESOPHAGEAL ECHOCARDIOGRAM (TEE);  Surgeon: Lelon Perla, MD;  [facility] ENDOS[facility];  Service: Cardiovascular;  Laterality: N/A;  . TUBAL LIGATION  1999    OB History    No data available       Home Medications    Prior to Admission medications   Medication Sig Start Date End Date Taking? Authorizing Provider  aspirin EC 81 MG tablet  Take 1 tablet (81 mg total) by mouth daily. 08/26/13   Larey Dresser, MD  enoxaparin (LOVENOX) 60 MG/0.6ML injection Inject 0.6 mLs (60 mg total) into the skin every 12 (twelve) hours. 01/13/17   [name], MD  warfarin (COUMADIN) 5 MG tablet Take as directed by coumadin clinic Patient taking differently: Take 5-7.5 mg by mouth daily at 6 PM. 7.5mg  on M, W, F, S Su - 5mg  on Tu, Thur 12/12/16   Evans Lance, MD  [facility] HAZEL EX Apply 1 application topically at bedtime as needed (knee pain).     [provider]    Family History Family History  Problem Relation Age of Onset  . Cancer Mother   . Stroke Father   . Sarcoidosis Sister   . Heart attack Neg Hx     Social History Social History  Substance Use Topics  . Smoking status: Former Smoker    Packs/day: 0.50    Years: 36.00    Types: Cigarettes    Quit date: 03/15/2013  . Smokeless tobacco: Never Used  . Alcohol use Yes     Comment: 01/11/2017  "glass of wine/month"     Allergies   Oxycodone   Review of Systems Review of Systems  Constitutional: Negative for fever.  HENT: Negative for rhinorrhea and sore throat.  Eyes: Negative for redness.  Respiratory: Negative for cough.   Cardiovascular: Negative for chest pain.  Gastrointestinal: Positive for blood in stool, diarrhea, nausea and vomiting. Negative for abdominal pain.  Genitourinary: Negative for dysuria.  Musculoskeletal: Negative for myalgias.  Skin: Negative for rash.  Neurological: Positive for light-headedness. Negative for headaches.     Physical Exam Updated Vital Signs BP 128/87   Pulse 90   Temp 97.7 F (36.5 C) (Oral)   Resp 11   LMP 03/22/2013   SpO2 100%   Physical Exam  Constitutional: She appears well-developed and well-nourished.  HENT:  Head: Normocephalic and atraumatic.  Mouth/Throat: Oropharynx is clear and moist. No oropharyngeal exudate.  Eyes: Conjunctivae are normal. Right eye exhibits no discharge. Left eye  exhibits no discharge.  Neck: Normal range of motion. Neck supple.  Cardiovascular: Normal rate and regular rhythm.   Murmur heard. Pulmonary/Chest: Effort normal and breath sounds normal. No respiratory distress. She has no wheezes. She has no rales.  Abdominal: Soft. There is no tenderness. There is no rebound and no guarding.  Genitourinary: Rectal exam shows external hemorrhoid (Nonthrombosed) and tenderness. Rectal exam shows no fissure, no mass and anal tone normal.  Neurological: She is alert.  Skin: Skin is warm and dry.  Psychiatric: She has a normal mood and affect.  Nursing note and vitals reviewed.    ED Treatments / Results  Labs (all labs ordered are listed, but only abnormal results are displayed) Labs Reviewed  CBC - Abnormal; Notable for the following:       Result Value   MCH 34.3 (*)    All other components within normal limits  URINALYSIS, ROUTINE W REFLEX MICROSCOPIC - Abnormal; Notable for the following:    Color, Urine AMBER (*)    APPearance HAZY (*)    Specific Gravity, Urine 1.003 (*)    Hgb urine dipstick LARGE (*)    Protein, ur 100 (*)    Leukocytes, UA MODERATE (*)    Bacteria, UA RARE (*)    Squamous Epithelial / LPF 0-5 (*)    All other components within normal limits  COMPREHENSIVE METABOLIC PANEL - Abnormal; Notable for the following:    Sodium 121 (*)    Chloride 93 (*)    CO2 19 (*)    Creatinine, Ser 1.28 (*)    GFR calc non Af Amer 47 (*)    GFR calc Af Amer 54 (*)    All other components within normal limits  PROTIME-INR - Abnormal; Notable for the following:    Prothrombin Time 28.0 (*)    All other components within normal limits  BASIC METABOLIC PANEL - Abnormal; Notable for the following:    Sodium 129 (*)    CO2 21 (*)    Creatinine, Ser 1.19 (*)    Calcium 8.6 (*)    GFR calc non Af Amer 51 (*)    GFR calc Af Amer 59 (*)    All other components within normal limits  BASIC METABOLIC PANEL - Abnormal; Notable for the  following:    CO2 18 (*)    Calcium 8.4 (*)    All other components within normal limits  CBC - Abnormal; Notable for the following:    RBC 3.54 (*)    HCT 34.8 (*)    All other components within normal limits  PROTIME-INR - Abnormal; Notable for the following:    Prothrombin Time 24.6 (*)    All other components within normal limits  POC OCCULT BLOOD, ED - Abnormal; Notable for the following:    Fecal Occult Bld POSITIVE (*)    All other components within normal limits  I-STAT TROPONIN, ED  TYPE AND SCREEN    EKG  EKG Interpretation  Date/Time:  Tuesday April 18 2017 10:50:18 EDT Ventricular Rate:  92 PR Interval:    QRS Duration: 99 QT Interval:  396 QTC Calculation: 490 R Axis:   -7 Text Interpretation:  Sinus rhythm Low voltage, precordial leads Borderline prolonged QT interval No significant change since last tracing Confirmed by Duffy Bruce [phone]) on 04/18/2017 10:56:48 AM       Radiology No results found.  Procedures Procedures (including critical care time)  Medications Ordered in ED Medications  aspirin EC tablet 81 mg (81 mg Oral Given 04/19/17 0822)  ondansetron (ZOFRAN) tablet 4 mg (not administered)    Or  ondansetron (ZOFRAN) injection 4 mg (not administered)  acetaminophen (TYLENOL) tablet 650 mg (not administered)    Or  acetaminophen (TYLENOL) suppository 650 mg (not administered)  warfarin (COUMADIN) tablet 5 mg (5 mg Oral Given 04/18/17 1931)    And  warfarin (COUMADIN) tablet 7.5 mg (not administered)  Warfarin - Physician Dosing Inpatient (not administered)  sodium chloride 0.9 % bolus 1,000 mL (0 mLs Intravenous Stopped 04/18/17 1419)  ondansetron (ZOFRAN) injection 4 mg (4 mg Intravenous Given 04/18/17 1206)     Initial Impression / Assessment and Plan / ED Course  I have reviewed the triage vital signs and the nursing notes.  Pertinent labs & imaging results that were available during my care of the patient were reviewed by me and  considered in my medical decision making (see chart for details).     Patient seen and examined. Work-up initiated.   Rectal performed with RN chaperone. Heme pos. No gross blood.   Vital signs reviewed and are as follows: BP 128/87   Pulse 90   Temp 97.7 F (36.5 C) (Oral)   Resp 11   LMP 03/22/2013   SpO2 100%   Discussed findings with Dr. Ellender Hose and patient. Given low sodium, dizziness, will admit.   Spoke with teaching service who will see and admit.   Final Clinical Impressions(s) / ED Diagnoses   Final diagnoses:  Hyponatremia  Lower GI bleed  Near syncope   Admit.    New Prescriptions New Prescriptions   No medications on file     Carlisle Cater, Hershal Coria 04/19/17 1549    Duffy Bruce, MD 04/21/17 1048

## 2017-04-18 NOTE — Progress Notes (Signed)
I saw and examined this patient.  Discussed with Drs Avon Gully and Shan Levans.  I will cosign their H&PE when available.  Briefly, Previously healthy woman with 7 day hx of stomach upset and 5 day hx of nausea, vomiting and diarrhea.  Denies abd pain.  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   Also noted to have BRRB (hx of hemorrhoids)  Patient states never colonoscopy.  On coumadin - therapeutic - because of previous mitral valve replacement.  Also on ASA daily presumably for hx of CVA.  It seems clear that her hyponatremia and dehydration are secondary to her acute GI illness.  I assume she will need simple hydration.  The bright red rectal bleeding is small volume.  She will need outpatient screening colonoscopy.  Doubt any inpatient intervention is needed but we will monitor.    Of note, I assume she will need to be bridged due to her mechanical valve if we find it necessary to hold coumadin.

## 2017-04-18 NOTE — ED Notes (Signed)
Admitting at bedside 

## 2017-04-18 NOTE — ED Triage Notes (Signed)
Pt arrives from work via [employer]. Pt endorses a sudden onset of dizziness/lightheadedness while at work and states she almost passed out. Pt denies any pain n/v/d, but does state she has had bright red rectal bleeding x 1 week.

## 2017-04-18 NOTE — Progress Notes (Signed)
Pt has arrived to [facility]. Ambulated from stretcher to bed, left sided weakness noted, but pt's gate study. VS stable, no c/o pain, no signs and symptoms of acute distress on assessment. Pt identified properly, alert and oriented x 4. Advised about valuable policy and information given to call for assistance. Call bell within reach and instruction given on how to use it. Pt oriented to room and equipment.  Will continue to monitor pt.

## 2017-04-18 NOTE — ED Notes (Signed)
Pt in room resting. No complaints at this time. Call bell in reach

## 2017-04-18 NOTE — ED Notes (Signed)
Pt ambulated to room located in room, tolerated well.

## 2017-04-18 NOTE — H&P (Signed)
[facility] Admission History and Physical Service Pager: [phone]  Patient name: Mary [name] Medical record number: [id number] Date of birth: 1961-09-17 Age: 56 y.o. Gender: female  Primary Care Provider: Patient, No Pcp Per Consultants: GI Code Status: FULL  Chief Complaint: presyncope, vomiting, diarrhea  Assessment and Plan: Mary [name] is a 56 y.o. female presenting with vomiting and diarrhea with bloody stools for 4-5 days and an episode of presyncope on 7/24. Found to be hyponatremic in the ED.  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   Hyponatremia:  Na of 121 in the ED.  Received 1L NS bolus in the ED and was started on a NS infusion @ 125 cc/hr.  This has been a previous problem for her during previous hospitalizations.  This is most likely secondary to decreased PO intake over the past week and GI losses due to recent diarrhea and vomiting, as well as possibly a low baseline sodium.  EKG on 7/24 wnl. -admit to med-surg under Dr. Andria Frames -continue NS infusion at 125 cc/hr -repeat BMP at 2000, then am BMP -vitals per unit routine  Presyncope:  Likely secondary to dehydration from low intake and GI losses.  Orthostatic BP measured in the ED showed BP of 112/72 upon standing.   -continue to monitor BP -continue hydration -monitor fluid status  Vomiting/Diarrhea: Most consistent with viral gastroenteritis. Likely cause of patient's hyponatremia, dehydration, and presyncope. 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 Currently asymptomatic. - Fluid resuscitation as above - Advance diet as tolerated  GI Bleed:  Patient notes bright red bloody, loose bowel movements over the past week.  Has had hemorrhoids in the past, but she is bleeding more now than she did in the past.  FOBT + in the ED.  Hemoglobin 13.4.   Patient has never had a colonoscopy. -consult GI in the am for possible outpatient colonoscopy - AM CBC  AKI:  Creatinine 1.28 in the ED, up from baseline of about 0.98. This is most likely prerenal due to dehydration -2000 BMP then am BMP - continue hydration  Valve replacement and repair:  Patient has had a tricuspid valve repair and mitral valve replacement with mechanical valve. Takes Coumadin for her mechanical valve.  Current INR is therapeutic at 2.5.  Although she has a +FOBT, her hemoglobin is 13.4, so it is likely safe to continue Coumadin. -continue Coumadin home dosing, which is 5 mg daily Tue Thur, 7.5 mg daily Sun, Mon, Wed, Fri -daily PT-INR  SVT:  First reported episode was June 2014, was controlled with metoprolol at that time.  This has not been a recent issue for her, and she is no longer taking a negative inotrope. -cardiac monitoring  Hx of CVA:  CVA in April 2017 resulted in residual left sided weakness; however, patient can walk without assistance and is fully functional. -continue ASA 81 mg -consider starting a statin   FEN/GI: regular, zofran Prophylaxis: coumadin  Disposition: place in observation  History of Present Illness:  Mary [name] is a 56 y.o. female presenting with hyponatremia and presyncope.  Patient started to feel "off" last Monday, July 16, and noticed a decreased appetite.  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  About three days ago, she began vomiting and having diarrhea.  She has vomited about 6 times in the past three days, and vomited on admission to the ED.  She has been unable to eat much during the past week, but she has been drinking plenty of water and ginger ale.  She has been able to tolerate her medicines and has not vomited right after taking them.  She denies blood in vomitus.  She notes that  she has also had very loose stools for about 4-5 days, and she notes that she is filling the toilet bowl with bright red blood during these bowel movements.  She has a history of hemorrhoids, but usually she would only noticed a little blood on her toilet paper when she would have hemorrhoidal bleeding in the past.  She denies pain with defecation, although she did have a hard stool a few days ago that did cause some pain.  She denies abdominal pain except in her upper abdomen, which she attributes to soreness due to vomiting.  She decided to come to the ED today because she felt significantly worse this morning, and she had a 10 minute episode of presyncope, where she felt as though she had tunnel vision.  She was seated when this episode occurred.  She was admitted in April 2018 with a similar episode of vomiting, diarrhea, and hyponatremia, and she says that this episode feels similar.  She says that she is feeling hungry now.   Review Of Systems: Per HPI with the following additions:   Review of Systems  Constitutional: Positive for chills. Negative for fever and malaise/fatigue.  HENT: Negative for congestion.   Respiratory: Negative for cough.   Gastrointestinal: Positive for abdominal pain, blood in stool, diarrhea and vomiting.  Genitourinary: Positive for frequency (Increased). Negative for dysuria.  Neurological: Positive for dizziness and headaches.    Patient Active Problem List   Diagnosis Date Noted  . Orthostatic hypotension 01/11/2017  . Chronic hyponatremia 01/11/2017  . Elevated liver enzymes 01/11/2017  . AKI (acute kidney injury) ([place]) 01/10/2016  . Subtherapeutic international normalized ratio (INR) 01/10/2016  . TIA (transient ischemic attack) 09/13/2015  . History of rheumatic heart disease 09/13/2015  . CVA (cerebral vascular accident) ([place]) 09/12/2015  . First degree heart block 06/17/2013  . Weakness 06/16/2013  . (HFpEF) heart failure with preserved ejection  fraction ([place]) 06/16/2013  . Anemia 06/16/2013  . History of bacterial endocarditis 06/16/2013  . Heart valve replaced by other means 06/10/2013  . Long term (current) use of anticoagulants 06/10/2013  . History of complete heart block 05/28/2013  . S/P minimally-invasive mitral valve replacement with mechanical valve and tricuspid valve repair 05/23/2013  . S/P minimally-invasive tricuspid valve repair 05/23/2013  . Femoral hernia 05/23/2013  . Severe mitral regurgitation 04/22/2013  . Mitral valve disorders(424.0) 04/22/2013  . Tricuspid regurgitation 04/22/2013  . 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 SVT (supraventricular tachycardia) ([place]) 03/16/2013    Past Medical History: Past Medical History:  Diagnosis Date  . Acute diastolic heart failure ([place]) 03/15/2013  . Anemia   . Carotid stenosis    Carotid US 2/17: bilateral ICA 1-39% >> FU prn  . Childhood asthma   . Complete heart block ([place]) 05/28/2013   Post-op  . Epigastric hernia 200's  . GERD (gastroesophageal reflux disease)   . Heart murmur   . History of blood transfusion    "14 w/1st pregnancy; 2 w/last C-section" (03/15/2013)  . Hx  of echocardiogram    a. Echo 4/16:  Mild focal basal septal hypertrophy, EF 60-65%, no RWMA, Mechanical MVR ok with mild central regurgitation and no perivalvular leak, small mobile density attached to valvular ring (1.5x1 cm) - post surgical changes vs SBE, mild LAE;   b. Echo 2/17:  Mild post wall LVH, EF 55-60%, no RWMA, mechanical MVR ok (mean 5 mmHg), normal RVSF  . Hypertension    no pcp   will go to [facility]  . 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 S/P mitral valve replacement with metallic valve 9/93/7169   24mm Sorin Carbomedics mechanical prosthesis via right mini thoracotomy approach  . 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   Complex valvuloplasty including Cor-matrix ECM patch augmentation of anterior and lateral leaflets with 57mm Edwards mc3  ring annuloplasty via right mini-thoracotomy approach  . Severe mitral regurgitation 04/22/2013  . Stroke ([place])   . SVT (supraventricular tachycardia) ([place]) 03/16/2013  . Tricuspid regurgitation     Past Surgical History: Past Surgical History:  Procedure Laterality Date  . APPENDECTOMY  [id number]  . CARDIAC CATHETERIZATION    . CARDIAC VALVE REPLACEMENT    . CESAREAN SECTION  1983  . CESAREAN SECTION WITH BILATERAL TUBAL LIGATION  1999  . FEMORAL HERNIA REPAIR Right 05/23/2013   Procedure: HERNIA REPAIR FEMORAL;  Surgeon: Rexene Alberts, MD;  Location: [facility];  Service: Open Heart Surgery;  Laterality: Right;  . INTRAOPERATIVE TRANSESOPHAGEAL ECHOCARDIOGRAM N/A 05/23/2013   Procedure: INTRAOPERATIVE TRANSESOPHAGEAL ECHOCARDIOGRAM;  Surgeon: Rexene Alberts, MD;  Location: [facility];  Service: Open Heart Surgery;  Laterality: N/A;  . LAPAROSCOPIC CHOLECYSTECTOMY  2003  . LEFT AND RIGHT HEART CATHETERIZATION WITH CORONARY ANGIOGRAM N/A 03/22/2013   Procedure: LEFT AND RIGHT HEART CATHETERIZATION WITH CORONARY ANGIOGRAM;  Surgeon: Burnell Blanks, MD;  Location: [facility] CATH LAB;  Service: Cardiovascular;  Laterality: N/A;  . MINIMALLY INVASIVE TRICUSPID VALVE REPAIR Right 05/23/2013   Procedure: MINIMALLY INVASIVE TRICUSPID VALVE REPAIR;  Surgeon: Rexene Alberts, MD;  Location: [facility];  Service: Open Heart Surgery;  Laterality: Right;  . MITRAL VALVE REPLACEMENT N/A 05/23/2013   Procedure: MITRAL VALVE (MV) REPLACEMENT;  Surgeon: Rexene Alberts, MD;  Location: [facility];  Service: Open Heart Surgery;  Laterality: N/A;  . MULTIPLE EXTRACTIONS WITH ALVEOLOPLASTY N/A 04/04/2013   Procedure: Extraction of tooth #'s 1,8,9,13,14,15,23,24,25,26 with alveoloplasty and gross debridement of remaining teeth;  Surgeon: Lenn Cal, DDS;  Location: [facility];  Service: Oral Surgery;  Laterality: N/A;  . TEE WITHOUT CARDIOVERSION N/A 03/18/2013   Procedure: TRANSESOPHAGEAL ECHOCARDIOGRAM (TEE);  Surgeon: Larey Dresser, MD;  Location: [facility];  Service: Cardiovascular;  Laterality: N/A;  . TEE WITHOUT CARDIOVERSION N/A 06/17/2013   Procedure: TRANSESOPHAGEAL ECHOCARDIOGRAM (TEE);  Surgeon: Lelon Perla, MD;  Location: [facility];  Service: Cardiovascular;  Laterality: N/A;  . TUBAL LIGATION  1999    Social History: Social History  Substance Use Topics  . Smoking status: Former Smoker    Packs/day: 0.50    Years: 36.00    Types: Cigarettes    Quit date: 03/15/2013  . Smokeless tobacco: Never Used  . Alcohol use Yes     Comment: 01/11/2017  "glass of wine/month"   Additional social history: Lives home alone. No family nearby. 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   Please also refer to relevant sections of EMR.  Family History: Family History  Problem Relation Age of Onset  . Cancer Mother   .  Stroke Father   . Sarcoidosis Sister   . Heart attack Neg Hx     Allergies and Medications: Allergies  Allergen Reactions  . Oxycodone Itching   No current facility-administered medications on file prior to encounter.    Current Outpatient Prescriptions on File Prior to Encounter  Medication Sig Dispense Refill  . aspirin EC 81 MG tablet Take 1 tablet (81 mg total) by mouth daily.    [name] warfarin (COUMADIN) 5 MG tablet Take as directed by coumadin clinic (Patient taking differently: Take 5-7.5 mg by mouth daily at 6 PM. 7.5mg  on M, W, F, S Su - 5mg  on Tu, Thur) 50 tablet 0  . WITCH HAZEL EX Apply 1 application topically at bedtime as needed (knee pain).     [name] enoxaparin (LOVENOX) 60 MG/0.6ML injection Inject 0.6 mLs (60 mg total) into the skin every 12 (twelve) hours. (Patient not taking: Reported on 04/18/2017) 7 Syringe 0    Objective: BP 118/79   Pulse 91   Temp 97.7 F (36.5 C) (Oral)   Resp 18   LMP 03/22/2013   SpO2 100%  Exam: General: pleasant woman lying comfortably in bed Eyes: PERLLA, EOMI ENTM: clear tympanic membrane, MMM Neck: no lymphadenopathy,  nontender Cardiovascular: RRR, click from mechanical mitral valve Respiratory: CTAB Gastrointestinal: slightly tender to palpation in upper abdomen, soft, nondistended MSK: grip strength 3/5 on LUE, 5/5 on RUE, strength 5/5 bilateral LE Derm: no rashes or ecchymoses noted Neuro: CN II-XII grossly normal, no asymmetry Psych: appropriate mood and affect  Labs and Imaging: CBC BMET   Recent Labs Lab 04/18/17 1146  WBC 8.1  HGB 13.4  HCT 37.5  PLT 353    Recent Labs Lab 04/18/17 1146  NA 121*  K 5.0  CL 93*  CO2 19*  BUN 14  CREATININE 1.28*  GLUCOSE 80  CALCIUM 8.9     No results found.   Kathrene Alu, MD 04/18/2017, 7:27 PM PGY-1, [place] Intern pager: [phone], text pages welcome  UPPER LEVEL ADDENDUM  I have read the above note and made revisions highlighted in orange.  Adin Hector, MD, MPH PGY-3 [facility] Pager [phone]

## 2017-04-19 ENCOUNTER — Encounter (HOSPITAL_COMMUNITY): Payer: Self-pay | Admitting: Gastroenterology

## 2017-04-19 LAB — BASIC METABOLIC PANEL
Anion gap: 8 (ref 5–15)
BUN: 13 mg/dL (ref 6–20)
CALCIUM: 8.4 mg/dL — AB (ref 8.9–10.3)
CO2: 18 mmol/L — AB (ref 22–32)
CREATININE: 0.99 mg/dL (ref 0.44–1.00)
Chloride: 109 mmol/L (ref 101–111)
GFR calc non Af Amer: >60 mL/min (ref >60)
Glucose, Bld: 80 mg/dL (ref 65–99)
Potassium: 4.2 mmol/L (ref 3.5–5.1)
Sodium: 135 mmol/L (ref 135–145)

## 2017-04-19 LAB — CBC
HCT: 34.8 % — ABNORMAL LOW (ref 36.0–46.0)
Hemoglobin: 12 g/dL (ref 12.0–15.0)
MCH: 33.9 pg (ref 26.0–34.0)
MCHC: 34.5 g/dL (ref 30.0–36.0)
MCV: 98.3 fL (ref 78.0–100.0)
PLATELETS: 299 10*3/uL (ref 150–400)
RBC: 3.54 MIL/uL — AB (ref 3.87–5.11)
RDW: 12.8 % (ref 11.5–15.5)
WBC: 6.9 10*3/uL (ref 4.0–10.5)

## 2017-04-19 LAB — PROTIME-INR
INR: 2.18
PROTHROMBIN TIME: 24.6 s — AB (ref 11.4–15.2)

## 2017-04-19 NOTE — Discharge Summary (Signed)
[facility] Discharge Summary  Patient name: Mary [name] Medical record number: [id number] Date of birth: 1960/12/30 Age: 56 y.o. Gender: female Date of Admission: 04/18/2017  Date of Discharge: 04/19/17  Admitting Physician: Zenia Resides, MD  Primary Care Provider: Patient, No Pcp Per Consultants: GI  Indication for Hospitalization: hyponatremia, GI bleed  Discharge Diagnoses/Problem List:  Viral gastroenteritis GI bleed Mechanical Mitral Valve on Coumadin  Disposition: home  Discharge Condition: stable, improving  Discharge Exam: please see progress note from day of discharge  Brief Hospital Course:  Mary [name] was admitted on 04/18/17 for hyponatremia with a Na of 121 secondary to dehydration from viral gastroenteritis.  She also had an episode of presyncope the morning of 04/18/17, also likely secondary to dehydration.  In addition, she reported bright red blood per rectum over the past week, which was a higher volume compared to the previous bleeding she had experienced from hemorrhoids.  FOBT+ and Hemoglobin was 13.4 on admission.  Sodium improved to 135 on 7/25 after fluid resuscitation, and GI saw her and said she needs to follow up with them in one week or two to set up a colonoscopy.    Issues for Follow Up:  1. INR was subtherapeutic at 2.18 on day of discharge; patient was advised to increase her Coumadin to 10 mg on 7/25 and 7.5 mg on 7/26.  She was also advised to get her INR checked in 3-5 days if possible. 2. Viral gastroenteritis: if unimproved in a week, she should be evaluated for any signs of dehydration and possible further workup when she sees her PCP at her hospital follow-up visit. 3. GI Bleed: patient needs a colonoscopy to find a possible source as well as for colon cancer screening since she is >33 years of age.  She will make an appointment with the GI team that saw her in the hospital to set this up.  Significant  Procedures: none  Significant Labs and Imaging:   Recent Labs Lab 04/18/17 1146 04/19/17 0507  WBC 8.1 6.9  HGB 13.4 12.0  HCT 37.5 34.8*  PLT 353 299    Recent Labs Lab 04/18/17 1146 04/18/17 2007 04/19/17 0507  NA 121* 129* 135  K 5.0 4.4 4.2  CL 93* 101 109  CO2 19* 21* 18*  GLUCOSE 80 92 80  BUN 14 14 13   CREATININE 1.28* 1.19* 0.99  CALCIUM 8.9 8.6* 8.4*  ALKPHOS 114  --   --   AST 31  --   --   ALT 14  --   --   ALBUMIN 3.6  --   --      Results/Tests Pending at Time of Discharge: none  Discharge Medications:  Allergies as of 04/19/2017      Reactions   Oxycodone Itching      Medication List    TAKE these medications   aspirin EC 81 MG tablet Take 1 tablet (81 mg total) by mouth daily.   warfarin 5 MG tablet Commonly known as:  COUMADIN Take as directed by coumadin clinic What changed:  how much to take  how to take this  when to take this  additional instructions   WITCH HAZEL EX Apply 1 application topically at bedtime as needed (knee pain).       Discharge Instructions: Please refer to Patient Instructions section of EMR for full details.  Patient was counseled important signs and symptoms that should prompt return to medical care, changes in  medications, dietary instructions, activity restrictions, and follow up appointments.   Follow-Up Appointments: Follow-up Information    Clarene Essex, MD. Schedule an appointment as soon as possible for a visit.   Specialty:  Gastroenterology Why:  Within the next 1-2 weeks Contact information: [address] [place] [address] [phone]           Kathrene Alu, MD 04/19/2017, 2:40 PM PGY-1, [place]

## 2017-04-19 NOTE — Discharge Instructions (Signed)
Please continue taking all of your medications as you have been, except take one half tablet extra of Coumadin the day of discharge and 7.5 mg instead of 5 mg Coumadin the following day.  Please also call to see if you can get your INR checked in 3-5 days.  It is important to schedule an appointment with Dr. Perley Jain office (the GI doctor) within the next 1-2 weeks.   Please also schedule an appointment with your regular doctor within the next week.

## 2017-04-19 NOTE — Progress Notes (Signed)
Nutrition Brief Note  Patient identified on the Malnutrition Screening Tool (MST) Report  Wt Readings from Last 15 Encounters:  04/18/17 138 lb 0.1 oz (62.6 kg)  01/12/17 132 lb 4.4 oz (60 kg)  01/10/17 133 lb 1.9 oz (60.4 kg)  12/19/16 139 lb (63 kg)  06/22/16 139 lb 3.2 oz (63.1 kg)  06/17/16 135 lb 6.4 oz (61.4 kg)  06/06/16 144 lb 2.9 oz (65.4 kg)  03/18/16 139 lb (63 kg)  02/01/16 137 lb 12.8 oz (62.5 kg)  01/10/16 134 lb 14.7 oz (61.2 kg)  10/21/15 139 lb (63 kg)  09/13/15 142 lb 3.2 oz (64.5 kg)  07/31/15 138 lb (62.6 kg)  06/04/15 135 lb (61.2 kg)  04/10/15 135 lb 1.9 oz (61.3 kg)   Mary [name] is a 56 y.o. female presenting with vomiting and diarrhea with bloody stools for 4-5 days and an episode of presyncope on 7/24. Found to be hyponatremic in the ED.  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  Spoke with pt, who reports generally good appetite and active lifestyle. She shares decline in appetite over the past 4-5 days PTA, due to difficult keeping foods down. Typically consumes 3 meals per day.   She shares UBW of 140#. She endorses small amount of weight loss, due to dehydration, but suspects weight has been regained since receiving fluids.   Nutrition-Focused physical exam completed. Findings are no fat depletion, no muscle depletion, and no edema.   Body mass index is 25.24 kg/m. Patient meets criteria for overweight based on current BMI.   Current diet order is regular, patient is consuming approximately 100% of meals at this time. Labs and medications reviewed.   No nutrition interventions warranted at this time. If nutrition issues arise, please consult RD.   Aodhan Scheidt A. Jimmye Norman, RD, LDN, CDE Pager: [phone] After hours Pager: [phone]

## 2017-04-19 NOTE — Consult Note (Signed)
Reason for Consult: Bright red blood per rectum Referring Physician: Hospital team  Mary [name] is an 56 y.o. female.  HPI: Patient seen and examined and discussed with the hospital team and her hospital computer chart reviewed and she's never had a colonoscopy before and her family history is negative from a GI standpoint and she will see bright red blood periodically from her hemorrhoids she says but this time it was a little worsening that and she did have some increased diarrhea and a probable virus however she's doing much better tolerating regular food and wants to go home and we had a long talk about colon screening and the risks benefits and methods of colonoscopy was discussed and will need to touch base with her cardiologist about her Coumadin prior to proceeding and she has no other complaints  Past Medical History:  Diagnosis Date  . Acute diastolic heart failure ([place]) 03/15/2013  . Anemia   . Carotid stenosis    Carotid US 2/17: bilateral ICA 1-39% >> FU prn  . Childhood asthma   . Complete heart block ([place]) 05/28/2013   Post-op  . Epigastric hernia 200's  . GERD (gastroesophageal reflux disease)   . Heart murmur   . History of blood transfusion    "14 w/1st pregnancy; 2 w/last C-section" (03/15/2013)  . Hx of echocardiogram    a. Echo 4/16:  Mild focal basal septal hypertrophy, EF 60-65%, no RWMA, Mechanical MVR ok with mild central regurgitation and no perivalvular leak, small mobile density attached to valvular ring (1.5x1 cm) - post surgical changes vs SBE, mild LAE;   b. Echo 2/17:  Mild post wall LVH, EF 55-60%, no RWMA, mechanical MVR ok (mean 5 mmHg), normal RVSF  . Hypertension    no pcp   will go to [facility]  . 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 S/P mitral valve replacement with metallic valve 0/62/3762   27m Sorin [employer] mechanical prosthesis via right mini thoracotomy approach  . 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 Severe mitral regurgitation 04/22/2013  . Stroke ([place]  . SV[place]  . Tricuspid regurgitation     Past Surgical History:  Procedure Laterality Date  . APPENDECTOMY  ~[id number]. CARDIAC CATHETERIZATION    . CARDIAC VALVE REPLACEMENT    . CESAREAN SECTION  1983  . CESAREAN SECTION WITH BILATERAL TUBAL LIGATION  1999  . FEMORAL HERNIA REPAIR Right 05/23/2013   Procedure: HERNIA REPAIR FEMORAL;  Surgeon: ClRexene AlbertsMD;  [place] Service: Open [place] Surgery;  Laterality: Right;  . INTRAOPERATIVE TRANSESOPHAGEAL ECHOCARDIOGRAM N/A 05/23/2013   Procedure: INTRAOPERATIVE TRANSESOPHAGEAL ECHOCARDIOGRAM;  Surgeon: ClRexene AlbertsMD;  [place] [facility] Service: Open Heart Surgery;  Laterality: N/A;  . LAPAROSCOPIC CHOLECYSTECTOMY  2003  . LEFT AND RIGHT HEART CATHETERIZATION WITH CORONARY ANGIOGRAM N/A 03/22/2013   Procedure: LEFT AND RIGHT HEART CATHETERIZATION WITH CORONARY ANGIOGRAM;  Surgeon: ChBurnell BlanksMD;  [facility];  Service: Cardiovascular;  Laterality: N/A;  . MINIMALLY INVASIVE TRICUSPID VALVE REPAIR Right 05/23/2013   Procedure: MINIMALLY INVASIVE TRICUSPID VALVE REPAIR;  Surgeon: ClRexene AlbertsMD;  [place]: [facility] Service: Open Heart Surgery;  Laterality: Right;  . MITRAL VALVE REPLACEMENT N/A 05/23/2013   Procedure: MITRAL VALVE (MV) REPLACEMENT;  Surgeon: ClValentina Gu  Roxy Manns, MD;  Location: [place];  Service: Open Heart Surgery;  Laterality: N/A;  . MULTIPLE EXTRACTIONS WITH ALVEOLOPLASTY N/A 04/04/2013   Procedure: Extraction of tooth #'s 1,8,9,13,14,15,23,24,25,26 with alveoloplasty and gross debridement of remaining teeth;  Surgeon: Lenn Cal, DDS;  Location: [place];  Service: Oral Surgery;  Laterality: N/A;  . TEE WITHOUT CARDIOVERSION N/A 03/18/2013    Procedure: TRANSESOPHAGEAL ECHOCARDIOGRAM (TEE);  Surgeon: Larey Dresser, MD;  Location: [place];  Service: Cardiovascular;  Laterality: N/A;  . TEE WITHOUT CARDIOVERSION N/A 06/17/2013   Procedure: TRANSESOPHAGEAL ECHOCARDIOGRAM (TEE);  Surgeon: Lelon Perla, MD;  Location: [facility] ENDOSCOPY;  Service: Cardiovascular;  Laterality: N/A;  . TUBAL LIGATION  1999    Family History  Problem Relation Age of Onset  . Cancer Mother   . Stroke Father   . Sarcoidosis Sister   . Heart attack Neg Hx     Social History:  reports that she quit smoking about 4 years ago. Her smoking use included Cigarettes. She has a 18.00 pack-year smoking history. She has never used smokeless tobacco. She reports that she drinks alcohol. She reports that she does not use drugs.  Allergies:  Allergies  Allergen Reactions  . Oxycodone Itching    Medications: I have reviewed the patient's current medications.  Results for orders placed or performed during the hospital encounter of 04/18/17 (from the past 48 hour(s))  CBC     Status: Abnormal   Collection Time: 04/18/17 11:46 AM  Result Value Ref Range   WBC 8.1 4.0 - 10.5 K/uL   RBC 3.91 3.87 - 5.11 MIL/uL   Hemoglobin 13.4 12.0 - 15.0 g/dL   HCT 37.5 36.0 - 46.0 %   MCV 95.9 78.0 - 100.0 fL   MCH 34.3 (H) 26.0 - 34.0 pg   MCHC 35.7 30.0 - 36.0 g/dL   RDW 12.6 11.5 - 15.5 %   Platelets 353 150 - 400 K/uL  Comprehensive metabolic panel     Status: Abnormal   Collection Time: 04/18/17 11:46 AM  Result Value Ref Range   Sodium 121 (L) 135 - 145 mmol/L   Potassium 5.0 3.5 - 5.1 mmol/L   Chloride 93 (L) 101 - 111 mmol/L   CO2 19 (L) 22 - 32 mmol/L   Glucose, Bld 80 65 - 99 mg/dL   BUN 14 6 - 20 mg/dL   Creatinine, Ser 1.28 (H) 0.44 - 1.00 mg/dL   Calcium 8.9 8.9 - 10.3 mg/dL   Total Protein 6.6 6.5 - 8.1 g/dL   Albumin 3.6 3.5 - 5.0 g/dL   AST 31 15 - 41 U/L   ALT 14 14 - 54 U/L   Alkaline Phosphatase 114 38 - 126 U/L   Total Bilirubin 0.3  0.3 - 1.2 mg/dL   GFR calc non Af Amer 47 (L) >60 mL/min   GFR calc Af Amer 54 (L) >60 mL/min    Comment: (NOTE) The eGFR has been calculated using the CKD EPI equation. This calculation has not been validated in all clinical situations. eGFR's persistently <60 mL/min signify possible Chronic Kidney Disease.    Anion gap 9 5 - 15  Protime-INR     Status: Abnormal   Collection Time: 04/18/17 11:46 AM  Result Value Ref Range   Prothrombin Time 28.0 (H) 11.4 - 15.2 seconds   INR 2.56   Type and screen [place]     Status: None   Collection Time: 04/18/17 11:49 AM  Result Value  Ref Range   ABO/RH(D) O POS    Antibody Screen NEG    Sample Expiration 04/21/2017   I-Stat Troponin, ED (not at [facility])     Status: None   Collection Time: 04/18/17 11:58 AM  Result Value Ref Range   Troponin i, poc 0.01 0.00 - 0.08 ng/mL   Comment 3            Comment: Due to the release kinetics of cTnI, a negative result within the first hours of the onset of symptoms does not rule out myocardial infarction with certainty. If myocardial infarction is still suspected, repeat the test at appropriate intervals.   Urinalysis, Routine w reflex microscopic     Status: Abnormal   Collection Time: 04/18/17 12:54 PM  Result Value Ref Range   Color, Urine AMBER (A) YELLOW    Comment: BIOCHEMICALS MAY BE AFFECTED BY COLOR   APPearance HAZY (A) CLEAR   Specific Gravity, Urine 1.003 (L) 1.005 - 1.030   pH 6.0 5.0 - 8.0   Glucose, UA NEGATIVE NEGATIVE mg/dL   Hgb urine dipstick LARGE (A) NEGATIVE   Bilirubin Urine NEGATIVE NEGATIVE   Ketones, ur NEGATIVE NEGATIVE mg/dL   Protein, ur 100 (A) NEGATIVE mg/dL   Nitrite NEGATIVE NEGATIVE   Leukocytes, UA MODERATE (A) NEGATIVE   RBC / HPF TOO NUMEROUS TO COUNT 0 - 5 RBC/hpf   WBC, UA 6-30 0 - 5 WBC/hpf   Bacteria, UA RARE (A) NONE SEEN   Squamous Epithelial / LPF 0-5 (A) NONE SEEN  POC occult blood, ED Provider will collect     Status: Abnormal    Collection Time: 04/18/17  1:21 PM  Result Value Ref Range   Fecal Occult Bld POSITIVE (A) NEGATIVE  Basic metabolic panel     Status: Abnormal   Collection Time: 04/18/17  8:07 PM  Result Value Ref Range   Sodium 129 (L) 135 - 145 mmol/L   Potassium 4.4 3.5 - 5.1 mmol/L   Chloride 101 101 - 111 mmol/L   CO2 21 (L) 22 - 32 mmol/L   Glucose, Bld 92 65 - 99 mg/dL   BUN 14 6 - 20 mg/dL   Creatinine, Ser 1.19 (H) 0.44 - 1.00 mg/dL   Calcium 8.6 (L) 8.9 - 10.3 mg/dL   GFR calc non Af Amer 51 (L) >60 mL/min   GFR calc Af Amer 59 (L) >60 mL/min    Comment: (NOTE) The eGFR has been calculated using the CKD EPI equation. This calculation has not been validated in all clinical situations. eGFR's persistently <60 mL/min signify possible Chronic Kidney Disease.    Anion gap 7 5 - 15  Basic metabolic panel     Status: Abnormal   Collection Time: 04/19/17  5:07 AM  Result Value Ref Range   Sodium 135 135 - 145 mmol/L   Potassium 4.2 3.5 - 5.1 mmol/L   Chloride 109 101 - 111 mmol/L   CO2 18 (L) 22 - 32 mmol/L   Glucose, Bld 80 65 - 99 mg/dL   BUN 13 6 - 20 mg/dL   Creatinine, Ser 0.99 0.44 - 1.00 mg/dL   Calcium 8.4 (L) 8.9 - 10.3 mg/dL   GFR calc non Af Amer >60 >60 mL/min   GFR calc Af Amer >60 >60 mL/min    Comment: (NOTE) The eGFR has been calculated using the CKD EPI equation. This calculation has not been validated in all clinical situations. eGFR's persistently <60 mL/min signify possible Chronic Kidney Disease.  Anion gap 8 5 - 15  CBC     Status: Abnormal   Collection Time: 04/19/17  5:07 AM  Result Value Ref Range   WBC 6.9 4.0 - 10.5 K/uL   RBC 3.54 (L) 3.87 - 5.11 MIL/uL   Hemoglobin 12.0 12.0 - 15.0 g/dL   HCT 34.8 (L) 36.0 - 46.0 %   MCV 98.3 78.0 - 100.0 fL   MCH 33.9 26.0 - 34.0 pg   MCHC 34.5 30.0 - 36.0 g/dL   RDW 12.8 11.5 - 15.5 %   Platelets 299 150 - 400 K/uL  Protime-INR     Status: Abnormal   Collection Time: 04/19/17  5:07 AM  Result Value Ref  Range   Prothrombin Time 24.6 (H) 11.4 - 15.2 seconds   INR 2.18     No results found.  ROS negative except above Blood pressure 121/79, pulse 93, temperature 98.4 F (36.9 C), temperature source Oral, resp. rate 17, height '5\' 2"'  (1.575 m), weight 62.6 kg (138 lb 0.1 oz), last menstrual period 03/22/2013, SpO2 100 %. Physical Exam patient looks well not examined today will be prior to her procedure  Assessment/Plan: Bright red blood per rectum seemingly self-limited in a patient on Coumadin without previous colonic screening Plan: We'll be happy to see her in our office in a week or 2 to probably set up colonoscopy however other other option should include surgical colonoscopy or even screening cologuard please call us back if we can be of any further assistance Zacaria Pousson E 04/19/2017, 1:33 PM

## 2017-04-19 NOTE — Progress Notes (Signed)
Family Medicine Teaching Service Daily Progress Note Intern Pager: [phone]  Patient name: Mary [name] Medical record number: [id number] Date of birth: [birth year] Age: 56 y.o. Gender: female  Primary Care Provider: Patient, No Pcp Per Consultants: none Code Status: FULL  Pt Overview and Major Events to Date:  Admitted on 7/24.  Assessment and Plan:  Mary [name] is a 56 y.o. female presenting with vomiting and diarrhea with bloody stools for 4-5 days and an episode of presyncope on 7/24. Found to be hyponatremic in the ED.  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   Hyponatremia:  Na of 121 in the ED.  Received 1L NS bolus in the ED and was started on a NS infusion @ 125 cc/hr.  This has been a previous problem for her during previous hospitalizations.  This is most likely secondary to decreased PO intake over the past week and GI losses due to recent diarrhea and vomiting, as well as possibly a low baseline sodium.  EKG on 7/24 wnl.  Sodium now 135. -discontinue NS infusion since taking PO -vitals per unit routine  Presyncope:  Likely secondary to dehydration from low intake and GI losses.  Orthostatic BP measured in the ED showed BP of 112/72 upon standing.   -continue to monitor BP -monitor fluid status  Vomiting/Diarrhea: Most consistent with viral gastroenteritis. Likely cause of patient's hyponatremia, dehydration, and presyncope. 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 Currently asymptomatic. - Fluid resuscitation as above - Advance diet as tolerated  GI Bleed:  Patient notes bright red bloody, loose bowel movements over the past week.  Has had hemorrhoids in the past, but she is bleeding more now than she did in the past.  FOBT + in the ED.  Hemoglobin 13.4.  Patient has never had a colonoscopy. -consult GI in the am for possible  outpatient colonoscopy; they will see today - AM CBC  AKI:  Creatinine 1.28 in the ED, up from baseline of about 0.98. This is most likely prerenal due to dehydration -2000 BMP then am BMP - continue hydration  Valve replacement and repair:  Patient has had a tricuspid valve repair and mitral valve replacement with mechanical valve. Takes Coumadin for her mechanical valve.  Current INR is therapeutic at 2.5.  Although she has a +FOBT, her hemoglobin is 13.4, so it is likely safe to continue Coumadin.  INR 2.18 on 7/25, measured before Wed Coumadin dose -continue Coumadin home dosing, which is 5 mg daily Tue Thur, 7.5 mg daily Sun, Mon, Wed, Fri -daily PT-INR  SVT:  First reported episode was June 2014, was controlled with metoprolol at that time.  This has not been a recent issue for her, and she is no longer taking a negative inotrope. -cardiac monitoring  Hx of CVA:  CVA in April 2017 resulted in residual left sided weakness; however, patient can walk without assistance and is fully functional. -continue ASA 81 mg -consider starting a statin, will be suggested on discharge summary   FEN/GI: regular, zofran Prophylaxis: coumadin   Disposition: Home, hopeful discharge after GI evaluation today  Subjective:  [name] feels well this morning but does endorse continued diarrhea without bleeding this morning.  She denies light-headedness or presyncope.    Objective: Temp:  [97.7 F (36.5 C)-98.5 F (36.9 C)] 98.4 F (36.9 C) (07/25 0618) Pulse Rate:  [87-101] 93 (07/25 0618)  Resp:  [11-18] 17 (07/25 0618) BP: (105-138)/(68-90) 121/79 (07/25 0618) SpO2:  [97 %-100 %] 100 % (07/25 0618) Weight:  [138 lb 0.1 oz (62.6 kg)] 138 lb 0.1 oz (62.6 kg) (07/24 1951) Physical Exam: General: Sitting in chair comfortably, in NAD Cardiovascular: RRR, click from mechanical mitral valve Respiratory: CTAB Abdomen: nontender, soft, +bowel sounds Extremities: full ROM, no  edema  Laboratory:  Recent Labs Lab 04/18/17 1146 04/19/17 0507  WBC 8.1 6.9  HGB 13.4 12.0  HCT 37.5 34.8*  PLT 353 299    Recent Labs Lab 04/18/17 1146 04/18/17 2007 04/19/17 0507  NA 121* 129* 135  K 5.0 4.4 4.2  CL 93* 101 109  CO2 19* 21* 18*  BUN 14 14 13   CREATININE 1.28* 1.19* 0.99  CALCIUM 8.9 8.6* 8.4*  PROT 6.6  --   --   BILITOT 0.3  --   --   ALKPHOS 114  --   --   ALT 14  --   --   AST 31  --   --   GLUCOSE 80 92 80     Imaging/Diagnostic Tests: none  Kathrene Alu, MD 04/19/2017, 7:24 AM PGY-1, [place] Intern pager: [phone], text pages welcome

## 2017-04-19 NOTE — Progress Notes (Signed)
Nsg Discharge Note  Admit Date:  04/18/2017 Discharge date: 04/19/2017   JOYLYNN DEFRANCESCO to be D/C'd Home per MD order.  AVS completed.  Copy for chart, and copy for patient signed, and dated. Patient/caregiver able to verbalize understanding.  Discharge Medication: Allergies as of 04/19/2017      Reactions   Oxycodone Itching      Medication List    TAKE these medications   aspirin EC 81 MG tablet Take 1 tablet (81 mg total) by mouth daily.   warfarin 5 MG tablet Commonly known as:  COUMADIN Take as directed by coumadin clinic What changed:  how much to take  how to take this  when to take this  additional instructions   WITCH HAZEL EX Apply 1 application topically at bedtime as needed (knee pain).       Discharge Assessment: Vitals:   04/18/17 1951 04/19/17 0618  BP: 107/75 121/79  Pulse: (!) 101 93  Resp: 17 17  Temp: 98.5 F (36.9 C) 98.4 F (36.9 C)   Skin clean, dry and intact without evidence of skin break down, no evidence of skin tears noted. IV catheter discontinued intact. Site without signs and symptoms of complications - no redness or edema noted at insertion site, patient denies c/o pain - only slight tenderness at site.  Dressing with slight pressure applied.  D/c Instructions-Education: Discharge instructions given to patient/family with verbalized understanding. D/c education completed with patient/family including follow up instructions, medication list, d/c activities limitations if indicated, with other d/c instructions as indicated by MD - patient able to verbalize understanding, all questions fully answered. Patient instructed to return to ED, call 911, or call MD for any changes in condition.  Patient escorted via [place], and D/C home via private auto.  Salley Slaughter, RN 04/19/2017 1:57 PM

## 2017-04-21 ENCOUNTER — Telehealth: Payer: Self-pay | Admitting: General Practice

## 2017-04-21 NOTE — Telephone Encounter (Signed)
Unsuccessful contact with pt. Left message requesting pt to call back and schedule f/u. - Mesha Guinyard

## 2017-05-01 ENCOUNTER — Ambulatory Visit (INDEPENDENT_AMBULATORY_CARE_PROVIDER_SITE_OTHER): Payer: Self-pay

## 2017-05-01 DIAGNOSIS — Z7901 Long term (current) use of anticoagulants: Secondary | ICD-10-CM

## 2017-05-01 DIAGNOSIS — I059 Rheumatic mitral valve disease, unspecified: Secondary | ICD-10-CM

## 2017-05-01 LAB — POCT INR: INR: 2.8

## 2017-05-01 MED ORDER — WARFARIN SODIUM 5 MG PO TABS: ORAL_TABLET | ORAL | 3 refills | Status: DC

## 2017-09-01 ENCOUNTER — Ambulatory Visit (INDEPENDENT_AMBULATORY_CARE_PROVIDER_SITE_OTHER): Payer: Self-pay | Admitting: Pharmacist

## 2017-09-01 DIAGNOSIS — I059 Rheumatic mitral valve disease, unspecified: Secondary | ICD-10-CM

## 2017-09-01 DIAGNOSIS — Z7901 Long term (current) use of anticoagulants: Secondary | ICD-10-CM

## 2017-09-01 LAB — POCT INR: INR: 2.9

## 2017-09-01 NOTE — Patient Instructions (Signed)
Continue on same dosage 1.5 tablets every day except 1 tablet Tuesdays and Thursdays.  Recheck in 6 weeks. Main # [phone]

## 2017-10-31 ENCOUNTER — Ambulatory Visit (INDEPENDENT_AMBULATORY_CARE_PROVIDER_SITE_OTHER): Payer: Self-pay

## 2017-10-31 DIAGNOSIS — I059 Rheumatic mitral valve disease, unspecified: Secondary | ICD-10-CM

## 2017-10-31 DIAGNOSIS — Z7901 Long term (current) use of anticoagulants: Secondary | ICD-10-CM

## 2017-10-31 LAB — POCT INR: INR: 2

## 2017-10-31 MED ORDER — WARFARIN SODIUM 5 MG PO TABS: ORAL_TABLET | ORAL | 2 refills | Status: DC

## 2017-10-31 NOTE — Patient Instructions (Signed)
Description   Take 2 tablets today, then resume same dosage 1.5 tablets every day except 1 tablet Tuesdays and Thursdays.  Recheck in 3 weeks. Main # [phone]

## 2017-11-29 ENCOUNTER — Encounter: Payer: Self-pay | Admitting: *Deleted

## 2017-11-29 ENCOUNTER — Encounter (HOSPITAL_COMMUNITY): Payer: Self-pay | Admitting: *Deleted

## 2017-11-29 ENCOUNTER — Emergency Department (HOSPITAL_COMMUNITY)
Admission: EM | Admit: 2017-11-29 | Discharge: 2017-11-29 | Disposition: A | Discharge status: Home / Self Care | Payer: Self-pay | Attending: Emergency Medicine | Admitting: Emergency Medicine

## 2017-11-29 ENCOUNTER — Other Ambulatory Visit: Payer: Self-pay

## 2017-11-29 ENCOUNTER — Emergency Department (HOSPITAL_COMMUNITY): Payer: Self-pay

## 2017-11-29 ENCOUNTER — Ambulatory Visit (INDEPENDENT_AMBULATORY_CARE_PROVIDER_SITE_OTHER): Payer: Self-pay | Admitting: *Deleted

## 2017-11-29 DIAGNOSIS — Z7901 Long term (current) use of anticoagulants: Secondary | ICD-10-CM

## 2017-11-29 DIAGNOSIS — Z7982 Long term (current) use of aspirin: Secondary | ICD-10-CM | POA: Insufficient documentation

## 2017-11-29 DIAGNOSIS — Z87891 Personal history of nicotine dependence: Secondary | ICD-10-CM | POA: Insufficient documentation

## 2017-11-29 DIAGNOSIS — R531 Weakness: Secondary | ICD-10-CM | POA: Insufficient documentation

## 2017-11-29 DIAGNOSIS — J40 Bronchitis, not specified as acute or chronic: Secondary | ICD-10-CM | POA: Insufficient documentation

## 2017-11-29 DIAGNOSIS — Z79899 Other long term (current) drug therapy: Secondary | ICD-10-CM | POA: Insufficient documentation

## 2017-11-29 DIAGNOSIS — R0602 Shortness of breath: Secondary | ICD-10-CM | POA: Insufficient documentation

## 2017-11-29 DIAGNOSIS — I10 Essential (primary) hypertension: Secondary | ICD-10-CM | POA: Insufficient documentation

## 2017-11-29 DIAGNOSIS — I059 Rheumatic mitral valve disease, unspecified: Secondary | ICD-10-CM

## 2017-11-29 LAB — COMPREHENSIVE METABOLIC PANEL
ALBUMIN: 2.9 g/dL — AB (ref 3.5–5.0)
ALT: 15 U/L (ref 14–54)
AST: 32 U/L (ref 15–41)
Alkaline Phosphatase: 135 U/L — ABNORMAL HIGH (ref 38–126)
Anion gap: 10 (ref 5–15)
BILIRUBIN TOTAL: 0.5 mg/dL (ref 0.3–1.2)
CO2: 26 mmol/L (ref 22–32)
CREATININE: 0.9 mg/dL (ref 0.44–1.00)
Calcium: 8.7 mg/dL — ABNORMAL LOW (ref 8.9–10.3)
Chloride: 110 mmol/L (ref 101–111)
GFR calc Af Amer: >60 mL/min (ref >60)
GFR calc non Af Amer: >60 mL/min (ref >60)
GLUCOSE: 88 mg/dL (ref 65–99)
Potassium: 4.3 mmol/L (ref 3.5–5.1)
SODIUM: 146 mmol/L — AB (ref 135–145)
Total Protein: 6.7 g/dL (ref 6.5–8.1)

## 2017-11-29 LAB — URINALYSIS, ROUTINE W REFLEX MICROSCOPIC
Bilirubin Urine: NEGATIVE
Glucose, UA: NEGATIVE mg/dL
Ketones, ur: NEGATIVE mg/dL
Leukocytes, UA: NEGATIVE
Nitrite: NEGATIVE
PH: 6 (ref 5.0–8.0)
Protein, ur: 100 mg/dL — AB
SPECIFIC GRAVITY, URINE: 1.009 (ref 1.005–1.030)

## 2017-11-29 LAB — I-STAT CHEM 8, ED
BUN: 3 mg/dL — AB (ref 6–20)
Calcium, Ion: 1.16 mmol/L (ref 1.15–1.40)
Chloride: 109 mmol/L (ref 101–111)
Creatinine, Ser: 1 mg/dL (ref 0.44–1.00)
Glucose, Bld: 82 mg/dL (ref 65–99)
HCT: 42 % (ref 36.0–46.0)
Hemoglobin: 14.3 g/dL (ref 12.0–15.0)
POTASSIUM: 4.2 mmol/L (ref 3.5–5.1)
SODIUM: 148 mmol/L — AB (ref 135–145)
TCO2: 26 mmol/L (ref 22–32)

## 2017-11-29 LAB — POCT INR: INR: 3.7

## 2017-11-29 LAB — CBC WITH DIFFERENTIAL/PLATELET
BASOS PCT: 1 %
Basophils Absolute: 0.1 10*3/uL (ref 0.0–0.1)
EOS ABS: 0.4 10*3/uL (ref 0.0–0.7)
EOS PCT: 7 %
HCT: 39 % (ref 36.0–46.0)
HEMOGLOBIN: 13.1 g/dL (ref 12.0–15.0)
LYMPHS PCT: 26 %
Lymphs Abs: 1.4 10*3/uL (ref 0.7–4.0)
MCH: 37.1 pg — ABNORMAL HIGH (ref 26.0–34.0)
MCHC: 33.6 g/dL (ref 30.0–36.0)
MCV: 110.5 fL — AB (ref 78.0–100.0)
MONO ABS: 0.3 10*3/uL (ref 0.1–1.0)
Monocytes Relative: 5 %
NEUTROS PCT: 61 %
Neutro Abs: 3.2 10*3/uL (ref 1.7–7.7)
PLATELETS: 425 10*3/uL — AB (ref 150–400)
RBC: 3.53 MIL/uL — AB (ref 3.87–5.11)
RDW: 15.4 % (ref 11.5–15.5)
WBC: 5.4 10*3/uL (ref 4.0–10.5)

## 2017-11-29 LAB — BRAIN NATRIURETIC PEPTIDE: B Natriuretic Peptide: 98.3 pg/mL (ref 0.0–100.0)

## 2017-11-29 LAB — TROPONIN I

## 2017-11-29 MED ORDER — AZITHROMYCIN 250 MG PO TABS: 250.0000 mg | ORAL_TABLET | Freq: Every day | ORAL | 0 refills | Status: DC

## 2017-11-29 MED ORDER — SODIUM CHLORIDE 0.9 % IV BOLUS (SEPSIS)
500.0000 mL | Freq: Once | INTRAVENOUS | Status: AC
  Administered 2017-11-29: 500 mL via INTRAVENOUS

## 2017-11-29 MED ORDER — ALBUTEROL SULFATE HFA 108 (90 BASE) MCG/ACT IN AERS: 1.0000 | INHALATION_SPRAY | Freq: Four times a day (QID) | RESPIRATORY_TRACT | 0 refills | Status: DC | PRN

## 2017-11-29 MED ORDER — PREDNISONE 20 MG PO TABS: ORAL_TABLET | ORAL | 0 refills | Status: DC

## 2017-11-29 MED ORDER — ALBUTEROL SULFATE (2.5 MG/3ML) 0.083% IN NEBU
5.0000 mg | INHALATION_SOLUTION | Freq: Once | RESPIRATORY_TRACT | Status: AC
  Administered 2017-11-29: 5 mg via RESPIRATORY_TRACT
  Filled 2017-11-29: qty 6

## 2017-11-29 NOTE — ED Provider Notes (Signed)
[facility] EMERGENCY DEPARTMENT Provider Note   CSN: [id number] Arrival date & time: 11/29/17  1103     History   Chief Complaint Chief Complaint  Patient presents with  . Cough  . Weakness    HPI Mary [name] is a 57 y.o. female.  HPI Patient presents with cough productive of yellow sputum, shortness of breath and central chest pain.  This been going on for 3 days.  Denies any fever or chills.  She endorses fatigue.  Chest pain is worse with deep breathing and movement.  No radiation.  No new lower extremity swelling or pain.  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 Past Medical History:  Diagnosis Date  . Acute diastolic heart failure ([place]) 03/15/2013  . Anemia   . Carotid stenosis    Carotid US 2/17: bilateral ICA 1-39% >> FU prn  . Childhood asthma   . Complete heart block ([place]) 05/28/2013   Post-op  . Epigastric hernia 200's  . GERD (gastroesophageal reflux disease)   . Heart murmur   . History of blood transfusion    "14 w/1st pregnancy; 2 w/last C-section" (03/15/2013)  . Hx of echocardiogram    a. Echo 4/16:  Mild focal basal septal hypertrophy, EF 60-65%, no RWMA, Mechanical MVR ok with mild central regurgitation and no perivalvular leak, small mobile density attached to valvular ring (1.5x1 cm) - post surgical changes vs SBE, mild LAE;   b. Echo 2/17:  Mild post wall LVH, EF 55-60%, no RWMA, mechanical MVR ok (mean 5 mmHg), normal RVSF  . Hypertension    no pcp   will go to [facility]  . 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 S/P mitral valve replacement with metallic valve 2/54/2706   70mm Sorin Carbomedics mechanical prosthesis via right mini thoracotomy approach  . 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 Severe mitral regurgitation  04/22/2013  . Stroke ([place])   . SVT (supraventricular tachycardia) ([place]) 03/16/2013  . Tricuspid regurgitation     Patient Active Problem List   Diagnosis Date Noted  . Hyponatremia 04/18/2017  . Orthostatic hypotension 01/11/2017  . Chronic hyponatremia 01/11/2017  . Elevated liver enzymes 01/11/2017  . AKI (acute kidney injury) ([place]) 01/10/2016  . Subtherapeutic international normalized ratio (INR) 01/10/2016  . TIA (transient ischemic attack) 09/13/2015  . History of rheumatic heart disease 09/13/2015  . CVA (cerebral vascular accident) ([place]) 09/12/2015  . First degree heart block 06/17/2013  . Weakness 06/16/2013  . (HFpEF) heart failure with preserved ejection fraction ([place]) 06/16/2013  . Anemia 06/16/2013  . History of bacterial endocarditis 06/16/2013  . Heart valve replaced by other means 06/10/2013  . Long term (current) use of anticoagulants 06/10/2013  . History of complete heart block 05/28/2013  . S/P minimally-invasive mitral valve replacement with mechanical valve and tricuspid valve repair 05/23/2013  . S/P minimally-invasive tricuspid valve repair 05/23/2013  . Femoral hernia 05/23/2013  . Severe mitral regurgitation 04/22/2013  . Mitral valve disorders(424.0) 04/22/2013  . Tricuspid regurgitation 04/22/2013  . 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 SVT (supraventricular tachycardia) ([place]) 03/16/2013    Past Surgical History:  Procedure Laterality Date  . APPENDECTOMY  [id number]  . CARDIAC CATHETERIZATION    . CARDIAC VALVE REPLACEMENT    . CESAREAN SECTION  1983  . CESAREAN SECTION WITH BILATERAL  TUBAL LIGATION  1999  . FEMORAL HERNIA REPAIR Right 05/23/2013   Procedure: HERNIA REPAIR FEMORAL;  Surgeon: Rexene Alberts, MD;  Location: [facility];  Service: Open Heart Surgery;  Laterality: Right;  . INTRAOPERATIVE TRANSESOPHAGEAL ECHOCARDIOGRAM N/A 05/23/2013   Procedure: INTRAOPERATIVE TRANSESOPHAGEAL ECHOCARDIOGRAM;  Surgeon: Rexene Alberts, MD;   Location: [facility];  Service: Open Heart Surgery;  Laterality: N/A;  . LAPAROSCOPIC CHOLECYSTECTOMY  2003  . LEFT AND RIGHT HEART CATHETERIZATION WITH CORONARY ANGIOGRAM N/A 03/22/2013   Procedure: LEFT AND RIGHT HEART CATHETERIZATION WITH CORONARY ANGIOGRAM;  Surgeon: Burnell Blanks, MD;  Location[facility] CATH LAB;  Service: Cardiovascular;  Laterality: N/A;  . MINIMALLY INVASIVE TRICUSPID VALVE REPAIR Right 05/23/2013   Procedure: MINIMALLY INVASIVE TRICUSPID VALVE REPAIR;  Surgeon: Rexene Alberts, MD;  Location: [facility];  Service: Open Heart Surgery;  Laterality: Right;  . MITRAL VALVE REPLACEMENT N/A 05/23/2013   Procedure: MITRAL VALVE (MV) REPLACEMENT;  Surgeon: Rexene Alberts, MD;  Location: [place] [facility];  Service: Open Heart Surgery;  Laterality: N/A;  . MULTIPLE EXTRACTIONS WITH ALVEOLOPLASTY N/A 04/04/2013   Procedure: Extraction of tooth #'s 1,8,9,13,14,15,23,24,25,26 with alveoloplasty and gross debridement of remaining teeth;  Surgeon: Lenn Cal, DDS;  Location: [facility];  Service: Oral Surgery;  Laterality: N/A;  . TEE WITHOUT CARDIOVERSION N/A 03/18/2013   Procedure: TRANSESOPHAGEAL ECHOCARDIOGRAM (TEE);  Surgeon: Larey Dresser, MD;  Location: [facility];  Service: Cardiovascular;  Laterality: N/A;  . TEE WITHOUT CARDIOVERSION N/A 06/17/2013   Procedure: TRANSESOPHAGEAL ECHOCARDIOGRAM (TEE);  Surgeon: Lelon Perla, MD;  Location: [facility] ENDOSCOPY;  Service: Cardiovascular;  Laterality: N/A;  . TUBAL LIGATION  1999    OB History    No data available       Home Medications    Prior to Admission medications   Medication Sig Start Date End Date Taking? Authorizing Provider  acetaminophen (TYLENOL) 500 MG tablet Take 500 mg by mouth every 6 (six) hours as needed for headache.   Yes [provider]  aspirin EC 81 MG tablet Take 1 tablet (81 mg total) by mouth daily. 08/26/13  Yes Larey Dresser, MD  DM-Doxylamine-Acetaminophen (NYQUIL COLD & FLU PO) Take 2 tablets by  mouth at bedtime as needed (cold symptoms).   Yes [provider]  warfarin (COUMADIN) 5 MG tablet Take as directed by coumadin clinic Patient taking differently: Take 5-7.5 mg by mouth See admin instructions. 5mg  on TUES and THUR, 7.5mg  on all other days OR Take as directed by coumadin clinic 10/31/17  Yes Evans Lance, MD  [facility] HAZEL EX Apply 1 application topically at bedtime as needed (knee pain).    Yes [provider]  albuterol (PROVENTIL HFA;VENTOLIN HFA) 108 (90 Base) MCG/ACT inhaler Inhale 1-2 puffs into the lungs every 6 (six) hours as needed for wheezing or shortness of breath. 11/29/17   Julianne Rice, MD  azithromycin (ZITHROMAX) 250 MG tablet Take 1 tablet (250 mg total) by mouth daily. Take first 2 tablets together, then 1 every day until finished. 11/29/17   Julianne Rice, MD  predniSONE (DELTASONE) 20 MG tablet 3 tabs po day one, then 2 po daily x 4 days 11/29/17   Julianne Rice, MD    Family History Family History  Problem Relation Age of Onset  . Cancer Mother   . Stroke Father   . Sarcoidosis Sister   . Heart attack Neg Hx     Social History Social History   Tobacco Use  .  Smoking status: Former Smoker    Packs/day: 0.50    Years: 36.00    Pack years: 18.00    Types: Cigarettes    Last attempt to quit: 03/15/2013    Years since quitting: 4.7  . Smokeless tobacco: Never Used  Substance Use Topics  . Alcohol use: Yes    Comment: 01/11/2017  "glass of wine/month"  . Drug use: No     Allergies   Oxycodone   Review of Systems Review of Systems  Constitutional: Positive for fatigue. Negative for chills and fever.  HENT: Negative for congestion and sore throat.   Eyes: Negative for visual disturbance.  Respiratory: Positive for cough and shortness of breath. Negative for wheezing.   Cardiovascular: Positive for chest pain. Negative for palpitations and leg swelling.  Gastrointestinal: Positive for vomiting. Negative for abdominal  pain, constipation, diarrhea and nausea.  Genitourinary: Negative for dysuria, flank pain and frequency.  Musculoskeletal: Negative for back pain, myalgias, neck pain and neck stiffness.  Skin: Negative for rash and wound.  Neurological: Negative for dizziness, weakness, light-headedness, numbness and headaches.  All other systems reviewed and are negative.    Physical Exam Updated Vital Signs BP (!) 148/96   Pulse 100   Temp 98.7 F (37.1 C) (Oral)   Resp 20   Ht 5\' 2"  (1.575 m)   Wt 59 kg (130 lb)   LMP 03/22/2013   SpO2 98%   BMI 23.78 kg/m   Physical Exam  Constitutional: She is oriented to person, place, and time. She appears well-developed and well-nourished. No distress.  HENT:  Head: Normocephalic and atraumatic.  Mouth/Throat: Oropharynx is clear and moist. No oropharyngeal exudate.  Eyes: EOM are normal. Pupils are equal, round, and reactive to light.  Neck: Normal range of motion. Neck supple. No JVD present.  Cardiovascular: Normal rate and regular rhythm.  Mechanical heart sounds  Pulmonary/Chest: Effort normal and breath sounds normal. No stridor. No respiratory distress. She has no wheezes. She has no rales. She exhibits no tenderness.  Abdominal: Soft. Bowel sounds are normal. There is no tenderness. There is no rebound and no guarding.  Musculoskeletal: Normal range of motion. She exhibits no edema or tenderness.  No lower extremity swelling, asymmetry or tenderness.  Distal pulses equal.  Lymphadenopathy:    She has no cervical adenopathy.  Neurological: She is alert and oriented to person, place, and time.  Moves all extremities without focal deficit.  Sensation fully intact.  Skin: Skin is warm and dry. Capillary refill takes less than 2 seconds. No rash noted. She is not diaphoretic. No erythema.  Psychiatric: She has a normal mood and affect. Her behavior is normal.  Nursing note and vitals reviewed.    ED Treatments / Results  Labs (all labs  ordered are listed, but only abnormal results are displayed) Labs Reviewed  CBC WITH DIFFERENTIAL/PLATELET - Abnormal; Notable for the following components:      Result Value   RBC 3.53 (*)    MCV 110.5 (*)    MCH 37.1 (*)    Platelets 425 (*)    All other components within normal limits  COMPREHENSIVE METABOLIC PANEL - Abnormal; Notable for the following components:   Sodium 146 (*)    BUN <5 (*)    Calcium 8.7 (*)    Albumin 2.9 (*)    Alkaline Phosphatase 135 (*)    All other components within normal limits  URINALYSIS, ROUTINE W REFLEX MICROSCOPIC - Abnormal; Notable for the following components:  APPearance HAZY (*)    Hgb urine dipstick LARGE (*)    Protein, ur 100 (*)    Bacteria, UA RARE (*)    Squamous Epithelial / LPF 6-30 (*)    All other components within normal limits  I-STAT CHEM 8, ED - Abnormal; Notable for the following components:   Sodium 148 (*)    BUN 3 (*)    All other components within normal limits  BRAIN NATRIURETIC PEPTIDE  TROPONIN I    EKG  EKG Interpretation None       Radiology Dg Chest 2 View  Result Date: 11/29/2017 CLINICAL DATA:  Cough, congestion EXAM: CHEST - 2 VIEW COMPARISON:  01/11/2017 FINDINGS: There is no focal parenchymal opacity. There is no pleural effusion or pneumothorax. The heart and mediastinal contours are unremarkable. There is evidence of prior mitral valve repair and aortic valve repair. The osseous structures are unremarkable. IMPRESSION: No active cardiopulmonary disease. Electronically Signed   By: Kathreen Devoid   On: 11/29/2017 12:24    Procedures Procedures (including critical care time)  Medications Ordered in ED Medications  sodium chloride 0.9 % bolus 500 mL (500 mLs Intravenous New Bag/Given 11/29/17 1652)  albuterol (PROVENTIL) (2.5 MG/3ML) 0.083% nebulizer solution 5 mg (5 mg Nebulization Given 11/29/17 1829)     Initial Impression / Assessment and Plan / ED Course  I have reviewed the triage vital  signs and the nursing notes.  Pertinent labs & imaging results that were available during my care of the patient were reviewed by me and considered in my medical decision making (see chart for details).    She is well-appearing.  Mild elevation in sodium.  Given fluid bolus.  Likely bronchitis with some degree of bronchospasm.  Start on short course of prednisone and give inhaler as needed for wheezing.  Also start on Z-Pak.  Return precautions have been given.   Final Clinical Impressions(s) / ED Diagnoses   Final diagnoses:  Bronchitis    ED Discharge Orders        Ordered    albuterol (PROVENTIL HFA;VENTOLIN HFA) 108 (90 Base) MCG/ACT inhaler  Every 6 hours PRN     11/29/17 1958    predniSONE (DELTASONE) 20 MG tablet     11/29/17 1958    azithromycin (ZITHROMAX) 250 MG tablet  Daily     11/29/17 1958       Julianne Rice, MD 11/29/17 [id number]

## 2017-11-29 NOTE — ED Triage Notes (Signed)
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

## 2017-11-29 NOTE — ED Notes (Signed)
Walked patient to the bathroom patient did well 

## 2017-11-29 NOTE — Patient Instructions (Signed)
Description   Today take 1/2 tablet then resume same dosage 1.5 tablets every day except 1 tablet Tuesdays and Thursdays.  Recheck in 2 weeks. Main # [phone]

## 2017-11-29 NOTE — ED Notes (Signed)
PT states understanding of care given, follow up care, and medication prescribed. PT ambulated from ED to car with a steady gait. 

## 2017-12-18 ENCOUNTER — Encounter (HOSPITAL_COMMUNITY): Payer: Self-pay | Admitting: Emergency Medicine

## 2017-12-18 ENCOUNTER — Other Ambulatory Visit: Payer: Self-pay

## 2017-12-18 ENCOUNTER — Emergency Department (HOSPITAL_COMMUNITY): Payer: Medicaid Other

## 2017-12-18 DIAGNOSIS — Z7982 Long term (current) use of aspirin: Secondary | ICD-10-CM | POA: Insufficient documentation

## 2017-12-18 DIAGNOSIS — Z87891 Personal history of nicotine dependence: Secondary | ICD-10-CM | POA: Insufficient documentation

## 2017-12-18 DIAGNOSIS — Z7901 Long term (current) use of anticoagulants: Secondary | ICD-10-CM | POA: Insufficient documentation

## 2017-12-18 DIAGNOSIS — J209 Acute bronchitis, unspecified: Secondary | ICD-10-CM | POA: Insufficient documentation

## 2017-12-18 DIAGNOSIS — Z79899 Other long term (current) drug therapy: Secondary | ICD-10-CM | POA: Insufficient documentation

## 2017-12-18 LAB — CBC WITH DIFFERENTIAL/PLATELET
Basophils Absolute: 0.1 10*3/uL (ref 0.0–0.1)
Basophils Relative: 1 %
EOS ABS: 0.3 10*3/uL (ref 0.0–0.7)
EOS PCT: 4 %
HCT: 44.1 % (ref 36.0–46.0)
HEMOGLOBIN: 14.8 g/dL (ref 12.0–15.0)
LYMPHS PCT: 27 %
Lymphs Abs: 1.9 10*3/uL (ref 0.7–4.0)
MCH: 37.2 pg — AB (ref 26.0–34.0)
MCHC: 33.6 g/dL (ref 30.0–36.0)
MCV: 110.8 fL — AB (ref 78.0–100.0)
MONO ABS: 0.4 10*3/uL (ref 0.1–1.0)
Monocytes Relative: 6 %
NEUTROS PCT: 62 %
Neutro Abs: 4.4 10*3/uL (ref 1.7–7.7)
Platelets: 439 10*3/uL — ABNORMAL HIGH (ref 150–400)
RBC: 3.98 MIL/uL (ref 3.87–5.11)
RDW: 14.8 % (ref 11.5–15.5)
WBC: 7.1 10*3/uL (ref 4.0–10.5)

## 2017-12-18 LAB — COMPREHENSIVE METABOLIC PANEL
ALT: 14 U/L (ref 14–54)
ANION GAP: 9 (ref 5–15)
AST: 31 U/L (ref 15–41)
Albumin: 3.1 g/dL — ABNORMAL LOW (ref 3.5–5.0)
Alkaline Phosphatase: 143 U/L — ABNORMAL HIGH (ref 38–126)
BUN: 7 mg/dL (ref 6–20)
CO2: 24 mmol/L (ref 22–32)
CREATININE: 0.86 mg/dL (ref 0.44–1.00)
Calcium: 9 mg/dL (ref 8.9–10.3)
Chloride: 111 mmol/L (ref 101–111)
Glucose, Bld: 98 mg/dL (ref 65–99)
Potassium: 4.7 mmol/L (ref 3.5–5.1)
SODIUM: 144 mmol/L (ref 135–145)
Total Bilirubin: 0.4 mg/dL (ref 0.3–1.2)
Total Protein: 7.2 g/dL (ref 6.5–8.1)

## 2017-12-18 LAB — URINALYSIS, ROUTINE W REFLEX MICROSCOPIC
BILIRUBIN URINE: NEGATIVE
GLUCOSE, UA: NEGATIVE mg/dL
KETONES UR: NEGATIVE mg/dL
LEUKOCYTES UA: NEGATIVE
Nitrite: NEGATIVE
PH: 6 (ref 5.0–8.0)
PROTEIN: 30 mg/dL — AB
Specific Gravity, Urine: 1.004 — ABNORMAL LOW (ref 1.005–1.030)

## 2017-12-18 LAB — I-STAT BETA HCG BLOOD, ED (MC, WL, AP ONLY): I-stat hCG, quantitative: 6.2 m[IU]/mL — ABNORMAL HIGH (ref <5)

## 2017-12-18 LAB — PREGNANCY, URINE: Preg Test, Ur: NEGATIVE

## 2017-12-18 LAB — I-STAT CG4 LACTIC ACID, ED: Lactic Acid, Venous: 1.86 mmol/L (ref 0.5–1.9)

## 2017-12-18 NOTE — ED Triage Notes (Signed)
Pt presents with productive cough x 3 weeks; pt denies fevers but states she feels cold; reports a blood tinged yellow/green sputum

## 2017-12-19 ENCOUNTER — Emergency Department (HOSPITAL_COMMUNITY)
Admission: EM | Admit: 2017-12-19 | Discharge: 2017-12-19 | Disposition: A | Discharge status: Home / Self Care | Payer: Medicaid Other | Attending: Emergency Medicine | Admitting: Emergency Medicine

## 2017-12-19 DIAGNOSIS — J40 Bronchitis, not specified as acute or chronic: Secondary | ICD-10-CM

## 2017-12-19 MED ORDER — BENZONATATE 100 MG PO CAPS
100.0000 mg | ORAL_CAPSULE | Freq: Once | ORAL | Status: AC
  Administered 2017-12-19: 100 mg via ORAL
  Filled 2017-12-19: qty 1

## 2017-12-19 MED ORDER — BENZONATATE 100 MG PO CAPS: 100.0000 mg | ORAL_CAPSULE | Freq: Three times a day (TID) | ORAL | 0 refills | Status: DC

## 2017-12-19 MED ORDER — PREDNISONE 20 MG PO TABS: 40.0000 mg | ORAL_TABLET | Freq: Every day | ORAL | 0 refills | Status: DC

## 2017-12-19 MED ORDER — DOXYCYCLINE HYCLATE 100 MG PO CAPS: 100.0000 mg | ORAL_CAPSULE | Freq: Two times a day (BID) | ORAL | 0 refills | Status: DC

## 2017-12-19 NOTE — ED Notes (Signed)
ED Provider at bedside. 

## 2017-12-19 NOTE — Discharge Instructions (Addendum)
Use your inhaler 2 puffs every 4 hrs. Take prednisone until all gone. Doxycycline until all gone. Please have your INR rechecked closely. Tessalon to help with cough. Follow up with family doctor in 3-5 days for recheck.

## 2017-12-19 NOTE — ED Provider Notes (Signed)
[facility] EMERGENCY DEPARTMENT Provider Note   CSN: [id number] Arrival date & time: 12/18/17  1739     History   Chief Complaint Chief Complaint  Patient presents with  . Cough    productive    HPI Mary [name] is a 57 y.o. female.  HPI Mary [name] is a 57 y.o. female with history of hypertension, anemia, heart failure, CVA, SVT, mitral regurgitation status post mitral valve replacement on Coumadin, presents to emergency department with complaint of cough.  Patient states that she has had cough for 3 weeks.  Initially when her symptoms began she was treated with Z-Pak, prednisone, inhaler.  She states that she never felt better.  She continues to have productive cough with thick sputum.  She denies any fever or chills.  She denies any shortness of breath.  No chest pain.  No swelling in extremities.  Denies any recent travel or surgeries.  She is on Coumadin for mitral valve replacement and states her last INR was 3.7.  She is currently not taking any medications for her cough.  She states nothing making her symptoms better or worse.  She reports associated nasal congestion.  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  She has no other complaints.  Past Medical History:  Diagnosis Date  . Acute diastolic heart failure ([place]) 03/15/2013  . Anemia   . Carotid stenosis    Carotid US 2/17: bilateral ICA 1-39% >> FU prn  . Childhood asthma   . Complete heart block ([place]) 05/28/2013   Post-op  . Epigastric hernia 200's  . GERD (gastroesophageal reflux disease)   . Heart murmur   . History of blood transfusion    "14 w/1st pregnancy; 2 w/last C-section" (03/15/2013)  . Hx of echocardiogram    a. Echo 4/16:  Mild focal basal septal hypertrophy, EF 60-65%, no RWMA, Mechanical MVR ok with mild central regurgitation and no perivalvular leak, small mobile density attached to valvular ring (1.5x1 cm) - post surgical changes vs SBE, mild LAE;   b. Echo 2/17:  Mild post  wall LVH, EF 55-60%, no RWMA, mechanical MVR ok (mean 5 mmHg), normal RVSF  . Hypertension    no pcp   will go to [facility]  . 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 S/P mitral valve replacement with metallic valve 8/84/1660   55mm Sorin Carbomedics mechanical prosthesis via right mini thoracotomy approach  . 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 Severe mitral regurgitation 04/22/2013  . Stroke ([place])   . SVT (supraventricular tachycardia) ([place]) 03/16/2013  . Tricuspid regurgitation     Patient Active Problem List   Diagnosis Date Noted  . Hyponatremia 04/18/2017  . Orthostatic hypotension 01/11/2017  . Chronic hyponatremia 01/11/2017  . Elevated liver enzymes 01/11/2017  . AKI (acute kidney injury) ([place]) 01/10/2016  . Subtherapeutic international normalized ratio (INR) 01/10/2016  . TIA (transient ischemic attack) 09/13/2015  . History of rheumatic heart disease 09/13/2015  . CVA (cerebral vascular accident) ([place]) 09/12/2015  . First degree heart block 06/17/2013  . Weakness 06/16/2013  . (HFpEF) heart failure with preserved ejection fraction ([place]) 06/16/2013  . Anemia 06/16/2013  . History of bacterial endocarditis 06/16/2013  . Heart valve replaced by other means 06/10/2013  . Long term (current) use of anticoagulants 06/10/2013  . History of complete heart  block 05/28/2013  . S/P minimally-invasive mitral valve replacement with mechanical valve and tricuspid valve repair 05/23/2013  . S/P minimally-invasive tricuspid valve repair 05/23/2013  . Femoral hernia 05/23/2013  . Severe mitral regurgitation 04/22/2013  . Mitral valve disorders(424.0) 04/22/2013  . Tricuspid regurgitation 04/22/2013  . 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 SVT (supraventricular tachycardia)  ([place]) 03/16/2013    Past Surgical History:  Procedure Laterality Date  . APPENDECTOMY  [id number]  . CARDIAC CATHETERIZATION    . CARDIAC VALVE REPLACEMENT    . CESAREAN SECTION  1983  . CESAREAN SECTION WITH BILATERAL TUBAL LIGATION  1999  . FEMORAL HERNIA REPAIR Right 05/23/2013   Procedure: HERNIA REPAIR FEMORAL;  Surgeon: Rexene Alberts, MD;  Location: [facility];  Service: Open Heart Surgery;  Laterality: Right;  . INTRAOPERATIVE TRANSESOPHAGEAL ECHOCARDIOGRAM N/A 05/23/2013   Procedure: INTRAOPERATIVE TRANSESOPHAGEAL ECHOCARDIOGRAM;  Surgeon: Rexene Alberts, MD;  Location: [facility];  Service: Open Heart Surgery;  Laterality: N/A;  . LAPAROSCOPIC CHOLECYSTECTOMY  2003  . LEFT AND RIGHT HEART CATHETERIZATION WITH CORONARY ANGIOGRAM N/A 03/22/2013   Procedure: LEFT AND RIGHT HEART CATHETERIZATION WITH CORONARY ANGIOGRAM;  Surgeon: Burnell Blanks, MD;  Location: [facility] CATH LAB;  Service: Cardiovascular;  Laterality: N/A;  . MINIMALLY INVASIVE TRICUSPID VALVE REPAIR Right 05/23/2013   Procedure: MINIMALLY INVASIVE TRICUSPID VALVE REPAIR;  Surgeon: Rexene Alberts, MD;  Location: [facility];  Service: Open Heart Surgery;  Laterality: Right;  . MITRAL VALVE REPLACEMENT N/A 05/23/2013   Procedure: MITRAL VALVE (MV) REPLACEMENT;  Surgeon: Rexene Alberts, MD;  Location: [facility];  Service: Open Heart Surgery;  Laterality: N/A;  . MULTIPLE EXTRACTIONS WITH ALVEOLOPLASTY N/A 04/04/2013   Procedure: Extraction of tooth #'s 1,8,9,13,14,15,23,24,25,26 with alveoloplasty and gross debridement of remaining teeth;  Surgeon: Lenn Cal, DDS;  Location: [facility];  Service: Oral Surgery;  Laterality: N/A;  . TEE WITHOUT CARDIOVERSION N/A 03/18/2013   Procedure: TRANSESOPHAGEAL ECHOCARDIOGRAM (TEE);  Surgeon: Larey Dresser, MD;  Location: [facility];  Service: Cardiovascular;  Laterality: N/A;  . TEE WITHOUT CARDIOVERSION N/A 06/17/2013   Procedure: TRANSESOPHAGEAL ECHOCARDIOGRAM (TEE);  Surgeon: Lelon Perla, MD;   Location[facility] ENDOSCOPY;  Service: Cardiovascular;  Laterality: N/A;  . TUBAL LIGATION  1999     OB History   None      Home Medications    Prior to Admission medications   Medication Sig Start Date End Date Taking? Authorizing Provider  acetaminophen (TYLENOL) 500 MG tablet Take 500 mg by mouth every 6 (six) hours as needed for headache.    [provider]  albuterol (PROVENTIL HFA;VENTOLIN HFA) 108 (90 Base) MCG/ACT inhaler Inhale 1-2 puffs into the lungs every 6 (six) hours as needed for wheezing or shortness of breath. 11/29/17   Julianne Rice, MD  aspirin EC 81 MG tablet Take 1 tablet (81 mg total) by mouth daily. 08/26/13   Larey Dresser, MD  azithromycin (ZITHROMAX) 250 MG tablet Take 1 tablet (250 mg total) by mouth daily. Take first 2 tablets together, then 1 every day until finished. 11/29/17   Julianne Rice, MD  benzonatate (TESSALON) 100 MG capsule Take 1 capsule (100 mg total) by mouth every 8 (eight) hours. 12/19/17   Usbaldo Pannone, [place] Rocker, PA-C  DM-Doxylamine-Acetaminophen (NYQUIL COLD & FLU PO) Take 2 tablets by mouth at bedtime as needed (cold symptoms).    [provider]  doxycycline (VIBRAMYCIN) 100 MG capsule Take 1 capsule (100 mg total) by mouth 2 (two) times  daily. 12/19/17   Napolean Sia, [place]ahoma Rocker, PA-C  predniSONE (DELTASONE) 20 MG tablet Take 2 tablets (40 mg total) by mouth daily. 12/19/17   Chelli Yerkes, Lahoma Rocker, PA-C  warfarin (COUMADIN) 5 MG tablet Take as directed by coumadin clinic Patient taking differently: Take 5-7.5 mg by mouth See admin instructions. 5mg  on TUES and THUR, 7.5mg  on all other days OR Take as directed by coumadin clinic 10/31/17   Evans Lance, MD  [facility] HAZEL EX Apply 1 application topically at bedtime as needed (knee pain).     [provider]    Family History Family History  Problem Relation Age of Onset  . Cancer Mother   . Stroke Father   . Sarcoidosis Sister   . Heart attack Neg Hx     Social  History Social History   Tobacco Use  . Smoking status: Former Smoker    Packs/day: 0.50    Years: 36.00    Pack years: 18.00    Types: Cigarettes    Last attempt to quit: 03/15/2013    Years since quitting: 4.7  . Smokeless tobacco: Never Used  Substance Use Topics  . Alcohol use: Yes    Comment: 01/11/2017  "glass of wine/month"  . Drug use: No     Allergies   Oxycodone   Review of Systems Review of Systems  Constitutional: Negative for chills and fever.  HENT: Positive for congestion.   Respiratory: Positive for cough. Negative for chest tightness and shortness of breath.   Cardiovascular: Negative for chest pain, palpitations and leg swelling.  Gastrointestinal: Negative for abdominal pain, diarrhea, nausea and vomiting.  Genitourinary: Negative for dysuria, flank pain, pelvic pain, vaginal bleeding, vaginal discharge and vaginal pain.  Musculoskeletal: Negative for arthralgias, myalgias, neck pain and neck stiffness.  Skin: Negative for rash.  Neurological: Negative for dizziness, weakness and headaches.  All other systems reviewed and are negative.    Physical Exam Updated Vital Signs BP (!) 142/92   Pulse 97   Temp 98.9 F (37.2 C) (Oral)   Resp 16   LMP 03/22/2013   SpO2 97%   Physical Exam  Constitutional: She appears well-developed and well-nourished. No distress.  HENT:  Head: Normocephalic.  Eyes: Conjunctivae are normal.  Neck: Neck supple.  Cardiovascular: Normal rate, regular rhythm and normal heart sounds.  Pulmonary/Chest: Effort normal and breath sounds normal. No respiratory distress. She has no wheezes. She has no rales.  Abdominal: Soft. Bowel sounds are normal. She exhibits no distension. There is no tenderness. There is no rebound.  Musculoskeletal: She exhibits no edema.  Neurological: She is alert.  Skin: Skin is warm and dry.  Psychiatric: She has a normal mood and affect. Her behavior is normal.  Nursing note and vitals  reviewed.    ED Treatments / Results  Labs (all labs ordered are listed, but only abnormal results are displayed) Labs Reviewed  COMPREHENSIVE METABOLIC PANEL - Abnormal; Notable for the following components:      Result Value   Albumin 3.1 (*)    Alkaline Phosphatase 143 (*)    All other components within normal limits  CBC WITH DIFFERENTIAL/PLATELET - Abnormal; Notable for the following components:   MCV 110.8 (*)    MCH 37.2 (*)    Platelets 439 (*)    All other components within normal limits  URINALYSIS, ROUTINE W REFLEX MICROSCOPIC - Abnormal; Notable for the following components:   APPearance HAZY (*)    Specific Gravity, Urine 1.004 (*)  Hgb urine dipstick LARGE (*)    Protein, ur 30 (*)    Bacteria, UA RARE (*)    Squamous Epithelial / LPF 0-5 (*)    All other components within normal limits  I-STAT BETA HCG BLOOD, ED ([facility], [facility], AP ONLY) - Abnormal; Notable for the following components:   I-stat hCG, quantitative 6.2 (*)    All other components within normal limits  PREGNANCY, URINE  I-STAT CG4 LACTIC ACID, ED    EKG EKG Interpretation  Date/Time:  Tuesday December 19 2017 04:00:40 EDT Ventricular Rate:  99 PR Interval:    QRS Duration: 96 QT Interval:  369 QTC Calculation: 474 R Axis:   7 Text Interpretation:  Sinus arrhythmia Ventricular premature complex Low voltage, precordial leads When compared with ECG of 11/29/2017, No significant change was found Confirmed by Delora Fuel ([id number]) on 12/19/2017 5:22:37 AM   Radiology Dg Chest 2 View  Result Date: 12/18/2017 CLINICAL DATA:  Pt presents with productive cough x 3 weeks; pt denies fevers but states she feels cold; reports a blood tinged yellow/green sputum, SOB and some diarrhea. Hx of acute diastolic heart failure, carotid stenosis, complete heart block, heart murmur, HTN, mitral valve replacement with metallic valve in 1740, tricuspid valve repair, stroke, SVT and tricuspid regirgitation. EXAM: CHEST - 2 VIEW  COMPARISON:  11/29/2017 and older exams. FINDINGS: There changes from prior cardiac surgery with mitral and tricuspid valve replacements. The heart is normal in size and configuration. No mediastinal hilar masses. No evidence of adenopathy. The lungs are hyperexpanded. There are mildly prominent bronchovascular markings with subtle peripheral interstitial thickening in the lower lungs, similar to prior studies. There is no lung consolidation to suggest pneumonia. No pulmonary edema. No pleural effusion or pneumothorax. Skeletal structures are intact. IMPRESSION: 1. No acute cardiopulmonary disease. Stable appearance of the chest from prior exams. Electronically Signed   By: Lajean Manes M.D.   On: 12/18/2017 19:44    Procedures Procedures (including critical care time)  Medications Ordered in ED Medications  benzonatate (TESSALON) capsule 100 mg (100 mg Oral Given 12/19/17 0434)     Initial Impression / Assessment and Plan / ED Course  I have reviewed the triage vital signs and the nursing notes.  Pertinent labs & imaging results that were available during my care of the patient were reviewed by me and considered in my medical decision making (see chart for details).     Patient in emergency department with persistent cough for 3 weeks.  She does have deep productive cough that is heard on my exam.  Her lungs however are clear.  Chest x-ray is unremarkable.  Labs are normal.  EKG with no changes.  Most likely bronchitis.  Discussed trying another round of antibiotics, prednisone, continue inhaler, Tessalon for cough.  Patient agrees to that plan.  At time of discharge, her vital signs are all within normal, she is in no acute distress, specifically no respiratory distress.  Vitals:   12/18/17 2245 12/19/17 0319 12/19/17 0359 12/19/17 0415  BP: 123/72 (!) 132/100 (!) 141/102 (!) 142/92  Pulse: (!) 113 100 95 97  Resp: 18  18 16   Temp: 99.1 F (37.3 C)  98.9 F (37.2 C)   TempSrc: Oral   Oral   SpO2: 98% 100% 100% 97%     Final Clinical Impressions(s) / ED Diagnoses   Final diagnoses:  Bronchitis    ED Discharge Orders        Ordered    doxycycline (VIBRAMYCIN)  100 MG capsule  2 times daily     12/19/17 0511    predniSONE (DELTASONE) 20 MG tablet  Daily     12/19/17 0511    benzonatate (TESSALON) 100 MG capsule  Every 8 hours     12/19/17 0511       Jeannett Senior, PA-C 19/37/90 2409    Delora Fuel, MD 73/53/29 [phone]

## 2018-01-02 ENCOUNTER — Encounter (INDEPENDENT_AMBULATORY_CARE_PROVIDER_SITE_OTHER): Payer: Self-pay

## 2018-01-02 ENCOUNTER — Ambulatory Visit (INDEPENDENT_AMBULATORY_CARE_PROVIDER_SITE_OTHER): Payer: Self-pay | Admitting: *Deleted

## 2018-01-02 DIAGNOSIS — Z7901 Long term (current) use of anticoagulants: Secondary | ICD-10-CM

## 2018-01-02 DIAGNOSIS — I059 Rheumatic mitral valve disease, unspecified: Secondary | ICD-10-CM

## 2018-01-02 LAB — POCT INR: INR: 1

## 2018-01-02 MED ORDER — WARFARIN SODIUM 5 MG PO TABS: ORAL_TABLET | ORAL | 2 refills | Status: DC

## 2018-01-02 NOTE — Patient Instructions (Signed)
Description   Today take 2 tablets, tomorrow take 2 tablets, then continue the  same dosage 1.5 tablets every day except 1 tablet Tuesdays and Thursdays.  Recheck in 2 weeks as pt will be going out of town until 01/15/18.   Main # [phone]

## 2018-01-08 ENCOUNTER — Telehealth: Payer: Self-pay | Admitting: Physician Assistant

## 2018-01-08 NOTE — Telephone Encounter (Signed)
Richardson Dopp PA-C completed & Signed Staff Medical Report paper for patient. I scanned into Epic. Called patient she is aware left at front desk ready for pick up.

## 2018-01-16 ENCOUNTER — Encounter (INDEPENDENT_AMBULATORY_CARE_PROVIDER_SITE_OTHER): Payer: Self-pay

## 2018-01-16 ENCOUNTER — Ambulatory Visit (INDEPENDENT_AMBULATORY_CARE_PROVIDER_SITE_OTHER): Payer: Self-pay | Admitting: *Deleted

## 2018-01-16 DIAGNOSIS — Z7901 Long term (current) use of anticoagulants: Secondary | ICD-10-CM

## 2018-01-16 DIAGNOSIS — G459 Transient cerebral ischemic attack, unspecified: Secondary | ICD-10-CM

## 2018-01-16 DIAGNOSIS — Z9889 Other specified postprocedural states: Secondary | ICD-10-CM

## 2018-01-16 DIAGNOSIS — I059 Rheumatic mitral valve disease, unspecified: Secondary | ICD-10-CM

## 2018-01-16 DIAGNOSIS — Z954 Presence of other heart-valve replacement: Secondary | ICD-10-CM

## 2018-01-16 DIAGNOSIS — Z5181 Encounter for therapeutic drug level monitoring: Secondary | ICD-10-CM

## 2018-01-16 LAB — POCT INR: INR: 1.3

## 2018-01-16 NOTE — Patient Instructions (Signed)
Description   Today April 23rd take 1 and 1/2 tablets (7.5mg ) then tomorrow April 24th take 2 tablets (10mg ) then change coumadin dose to 1 and 1/2  Tablets (7.5mg )  every day except 1 tablet (5mg )  only on  Tuesdays   Recheck in 1 week    Main # [phone]

## 2018-01-22 ENCOUNTER — Ambulatory Visit (INDEPENDENT_AMBULATORY_CARE_PROVIDER_SITE_OTHER): Payer: Self-pay | Admitting: *Deleted

## 2018-01-22 ENCOUNTER — Encounter: Payer: Self-pay | Admitting: Internal Medicine

## 2018-01-22 ENCOUNTER — Ambulatory Visit (INDEPENDENT_AMBULATORY_CARE_PROVIDER_SITE_OTHER): Payer: Self-pay | Admitting: Internal Medicine

## 2018-01-22 VITALS — BP 132/90 | HR 131 | Ht 62.0 in | Wt 132.6 lb

## 2018-01-22 DIAGNOSIS — R002 Palpitations: Secondary | ICD-10-CM

## 2018-01-22 DIAGNOSIS — R030 Elevated blood-pressure reading, without diagnosis of hypertension: Secondary | ICD-10-CM

## 2018-01-22 DIAGNOSIS — R000 Tachycardia, unspecified: Secondary | ICD-10-CM

## 2018-01-22 DIAGNOSIS — Z952 Presence of prosthetic heart valve: Secondary | ICD-10-CM

## 2018-01-22 DIAGNOSIS — R0789 Other chest pain: Secondary | ICD-10-CM

## 2018-01-22 DIAGNOSIS — I059 Rheumatic mitral valve disease, unspecified: Secondary | ICD-10-CM

## 2018-01-22 DIAGNOSIS — R0602 Shortness of breath: Secondary | ICD-10-CM

## 2018-01-22 DIAGNOSIS — I340 Nonrheumatic mitral (valve) insufficiency: Secondary | ICD-10-CM

## 2018-01-22 DIAGNOSIS — Z7901 Long term (current) use of anticoagulants: Secondary | ICD-10-CM

## 2018-01-22 LAB — POCT INR: INR: 2.2

## 2018-01-22 NOTE — Patient Instructions (Signed)
Description   Today take 2 tablets, then start taking 1.5 tablets everyday.  Recheck in 2 weeks. Continue eating the same amount of leafy green vegetables.  Main # [phone]

## 2018-01-22 NOTE — Progress Notes (Signed)
Follow-up Outpatient Visit Date: 01/22/2018  Primary Care Provider: Patient, No Pcp Per No address on file  Chief Complaint: Shortness of breath and palpitations  HPI:  Mary [name] is a 57 y.o. year-old female with history of mechanical mitral valve replacement and tricuspid valve repair in 02/2013 due to severe mitral regurgitation in the setting of bacterial endocarditis.  Postoperative course was complicated by transient complete heart block without need for permanent pacemaker placement.  She had an episode of complete heart block while asleep in 2015, though pacemaker was not recommended at that time either.  She subsequently had TIA symptoms in 05/2016 and has also struggled with sinus tachycardia and palpitations.  She did not tolerate low-dose beta-blockade due to nocturnal episodes of high-grade AV block.  No further recommendations were made by EP in 05/2016.  She was last seen in our office in 12/2016 by Richardson Dopp, PA,, having previously followed with Dr. Aundra Dubin.  She again reported symptomatic palpitations as well as intermittent lightheadedness.  Today, [name] reports that she is feeling better than a year ago.  She still has frequent palpitations and sensation of her heart racing, though she has grown accustomed to this.  She notes intermittent dizziness and had an episode during which it seemed like things were spinning around her last week.  She has not fallen or passed out.  She notes rare episodes of chest pressure that have been present for years, lasting about a minute.  They are not exertional.  She notes some increased shortness of breath and coughing over the last few weeks which prompted her to go to the emergency department.  She was diagnosed with bronchitis and treated with antibiotics.  She continues to have a cough with green sputum, though this is gradually improving.  Over the last few weeks, [name] has noticed increased swelling as well as a 2 pound weight gain.   This began around the time that she traveled to [place].  She denies any dietary changes.  She reports being compliant with her medications though she notes that her INRs have been subtherapeutic recently.  She has chronic tingling and numbness in the left arm, unchanged.  This improves when she takes additional aspirin.  She has not had any significant bleeding.  She has stable two-pillow orthopnea.  She began exercising yesterday and spent 30 minutes in the gym without any difficulty.  She notes some dyspnea on exertion when walking 2 blocks uphill to catch the bus.  --------------------------------------------------------------------------------------------------  Past Medical History:  Diagnosis Date  . Acute diastolic heart failure ([place]) 03/15/2013  . Anemia   . Carotid stenosis    Carotid US 2/17: bilateral ICA 1-39% >> FU prn  . Childhood asthma   . Complete heart block ([place]) 05/28/2013   Post-op  . Epigastric hernia 200's  . GERD (gastroesophageal reflux disease)   . Heart murmur   . History of blood transfusion    "14 w/1st pregnancy; 2 w/last C-section" (03/15/2013)  . Hx of echocardiogram    a. Echo 4/16:  Mild focal basal septal hypertrophy, EF 60-65%, no RWMA, Mechanical MVR ok with mild central regurgitation and no perivalvular leak, small mobile density attached to valvular ring (1.5x1 cm) - post surgical changes vs SBE, mild LAE;   b. Echo 2/17:  Mild post wall LVH, EF 55-60%, no RWMA, mechanical MVR ok (mean 5 mmHg), normal RVSF  . Hypertension    no pcp   will go to [facility]  .  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 S/P mitral valve replacement with metallic valve 1/61/0960   12mm Sorin Carbomedics mechanical prosthesis via right mini thoracotomy approach  . 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 Severe mitral regurgitation 04/22/2013  . Stroke ([place])   . SVT (supraventricular tachycardia) ([place]) 03/16/2013  . Tricuspid regurgitation    Past Surgical History:  Procedure Laterality Date  . APPENDECTOMY  [id number]  . CARDIAC CATHETERIZATION    . CARDIAC VALVE REPLACEMENT    . CESAREAN SECTION  1983  . CESAREAN SECTION WITH BILATERAL TUBAL LIGATION  1999  . FEMORAL HERNIA REPAIR Right 05/23/2013   Procedure: HERNIA REPAIR FEMORAL;  Surgeon: Rexene Alberts, MD;  Location: [place];  Service: Open Heart Surgery;  Laterality: Right;  . INTRAOPERATIVE TRANSESOPHAGEAL ECHOCARDIOGRAM N/A 05/23/2013   Procedure: INTRAOPERATIVE TRANSESOPHAGEAL ECHOCARDIOGRAM;  Surgeon: Rexene Alberts, MD;  Location: [place];  Service: Open Heart Surgery;  Laterality: N/A;  . LAPAROSCOPIC CHOLECYSTECTOMY  2003  . LEFT AND RIGHT HEART CATHETERIZATION WITH CORONARY ANGIOGRAM N/A 03/22/2013   Procedure: LEFT AND RIGHT HEART CATHETERIZATION WITH CORONARY ANGIOGRAM;  Surgeon: Burnell Blanks, MD;  Location: [facility] CATH LAB;  Service: Cardiovascular;  Laterality: N/A;  . MINIMALLY INVASIVE TRICUSPID VALVE REPAIR Right 05/23/2013   Procedure: MINIMALLY INVASIVE TRICUSPID VALVE REPAIR;  Surgeon: Rexene Alberts, MD;  Location: [place];  Service: Open Heart Surgery;  Laterality: Right;  . MITRAL VALVE REPLACEMENT N/A 05/23/2013   Procedure: MITRAL VALVE (MV) REPLACEMENT;  Surgeon: Rexene Alberts, MD;  Location: [place];  Service: Open Heart Surgery;  Laterality: N/A;  . MULTIPLE EXTRACTIONS WITH ALVEOLOPLASTY N/A 04/04/2013   Procedure: Extraction of tooth #'s 1,8,9,13,14,15,23,24,25,26 with alveoloplasty and gross debridement of remaining teeth;  Surgeon: Lenn Cal, DDS;  Location: [place];  Service: Oral Surgery;  Laterality: N/A;  . TEE WITHOUT CARDIOVERSION N/A 03/18/2013   Procedure: TRANSESOPHAGEAL ECHOCARDIOGRAM (TEE);  Surgeon: Larey Dresser, MD;  Location: [place];  Service: Cardiovascular;  Laterality:  N/A;  . TEE WITHOUT CARDIOVERSION N/A 06/17/2013   Procedure: TRANSESOPHAGEAL ECHOCARDIOGRAM (TEE);  Surgeon: Lelon Perla, MD;  Location: [facility] ENDOSCOPY;  Service: Cardiovascular;  Laterality: N/A;  . TUBAL LIGATION  1999    Current Meds  Medication Sig  . acetaminophen (TYLENOL) 500 MG tablet Take 500 mg by mouth every 6 (six) hours as needed for headache.  . albuterol (PROVENTIL HFA;VENTOLIN HFA) 108 (90 Base) MCG/ACT inhaler Inhale 1-2 puffs into the lungs every 6 (six) hours as needed for wheezing or shortness of breath.  [name] aspirin EC 81 MG tablet Take 1 tablet (81 mg total) by mouth daily.  [name] warfarin (COUMADIN) 5 MG tablet Take as directed by coumadin clinic  . WITCH HAZEL EX Apply 1 application topically at bedtime as needed (knee pain).     Allergies: Oxycodone  Social History   Tobacco Use  . Smoking status: Former Smoker    Packs/day: 0.50    Years: 36.00    Pack years: 18.00    Types: Cigarettes    Last attempt to quit: 03/15/2013    Years since quitting: 4.8  . Smokeless tobacco: Never Used  Substance Use Topics  . Alcohol use: Yes    Comment: 01/11/2017  "glass of wine/month"  . Drug use: No    Family History  Problem Relation  Age of Onset  . Cancer Mother   . Stroke Father   . Sarcoidosis Sister   . Heart attack Neg Hx     Review of Systems: A 12-system review of systems was performed and was negative except as noted in the HPI.  --------------------------------------------------------------------------------------------------  Physical Exam: BP 132/90   Pulse (!) 131   Ht 5\' 2"  (1.575 m)   Wt 132 lb 9.6 oz (60.1 kg)   LMP 03/22/2013   BMI 24.25 kg/m   General: NAD. HEENT: No conjunctival pallor or scleral icterus. Moist mucous membranes.  OP clear. Neck: Supple without lymphadenopathy, thyromegaly, JVD, or HJR. No carotid bruit. Lungs: Normal work of breathing. Clear to auscultation bilaterally without wheezes or crackles. Heart: Tachycardic  but regular with mechanical S1 and S2.  No murmurs or rubs.  Nondisplaced PMI. Abd: Bowel sounds present. Soft, NT/ND without hepatosplenomegaly Ext: No lower extremity edema. Radial, PT, and DP pulses are 2+ bilaterally. Skin: Warm and dry without rash.  EKG: Sinus tachycardia with isolated PVC.  Left axis deviation with low voltage.  Lab Results  Component Value Date   WBC 7.1 12/18/2017   HGB 14.8 12/18/2017   HCT 44.1 12/18/2017   MCV 110.8 (H) 12/18/2017   PLT 439 (H) 12/18/2017    Lab Results  Component Value Date   NA 144 12/18/2017   K 4.7 12/18/2017   CL 111 12/18/2017   CO2 24 12/18/2017   BUN 7 12/18/2017   CREATININE 0.86 12/18/2017   GLUCOSE 98 12/18/2017   ALT 14 12/18/2017    Lab Results  Component Value Date   CHOL 212 (H) 06/06/2016   HDL 66 06/06/2016   LDLCALC 124 (H) 06/06/2016   TRIG 108 06/06/2016   CHOLHDL 3.2 06/06/2016    --------------------------------------------------------------------------------------------------  ASSESSMENT AND PLAN: Sinus tachycardia and palpitations Symptoms are less pronounced than at last year's visit.  Ms. Colonna continues to have some palpitations and is noted to have sinus tachycardia again today.  Unfortunately, she has been intolerant of beta-blockers in the past due to transient high-grade AV block.  This has even occurred overnight without AV nodal blocking agents.  We have agreed to defer adding medications at this time.  Ivabradine would be a consideration for her in the future, though I would favor further EP evaluation first before proceeding with this.  I will recheck a TSH at the patient's convenience.  Mitral regurgitation status post mechanical MVR and shortness of breath Ms. Lodico reports increased edema and shortness of breath over the last several months.  She appears euvolemic on exam today and reports NYHA class II heart failure symptoms.  We have agreed to obtain a transthoracic echocardiogram for  further assessment.  We will continue with aspirin and warfarin.  She does not see a dentist regularly, though I reminded her of the importance for antibiotic prophylaxis.  Atypical chest pain Long-standing and nonexertional.  Low suspicion for CAD, given no significant coronary artery disease by catheterization in 2014.  I will check a fasting lipid panel when patient returns for labs in the next week or 2 for risk stratification.  Elevated blood pressure Diastolic pressure mildly elevated today.  We will defer medication changes at this time pending results of the echocardiogram.  Follow-up: Return to clinic in 6 weeks.  Nelva Bush, MD 01/22/2018 8:18 AM

## 2018-01-22 NOTE — Patient Instructions (Addendum)
Medication Instructions:  Your physician recommends that you continue on your current medications as directed. Please refer to the Current Medication list given to you today.  -- If you need a refill on your cardiac medications before your next appointment, please call your pharmacy. --  Labwork: TSH LIPID PROFILE (NEXT COUMADIN CLINIC APPT)  Testing/Procedures: Schedule when available please  Your physician has requested that you have an echocardiogram. Echocardiography is a painless test that uses sound waves to create images of your heart. It provides your doctor with information about the size and shape of your heart and how well your heart's chambers and valves are working. This procedure takes approximately one hour. There are no restrictions for this procedure.   Follow-Up: Your physician wants you to follow-up in: 6 weeks with Dr. Saunders Revel.     Thank you for choosing [facility]!!    Any Other Special Instructions Will Be Listed Below (If Applicable).

## 2018-02-06 ENCOUNTER — Other Ambulatory Visit: Payer: Self-pay

## 2018-02-06 ENCOUNTER — Ambulatory Visit (INDEPENDENT_AMBULATORY_CARE_PROVIDER_SITE_OTHER): Payer: Self-pay | Admitting: *Deleted

## 2018-02-06 ENCOUNTER — Ambulatory Visit (HOSPITAL_COMMUNITY): Payer: Self-pay | Attending: Cardiology

## 2018-02-06 DIAGNOSIS — R0602 Shortness of breath: Secondary | ICD-10-CM

## 2018-02-06 DIAGNOSIS — Z954 Presence of other heart-valve replacement: Secondary | ICD-10-CM

## 2018-02-06 DIAGNOSIS — Z5181 Encounter for therapeutic drug level monitoring: Secondary | ICD-10-CM

## 2018-02-06 DIAGNOSIS — I110 Hypertensive heart disease with heart failure: Secondary | ICD-10-CM | POA: Insufficient documentation

## 2018-02-06 DIAGNOSIS — I059 Rheumatic mitral valve disease, unspecified: Secondary | ICD-10-CM

## 2018-02-06 DIAGNOSIS — I471 Supraventricular tachycardia: Secondary | ICD-10-CM | POA: Insufficient documentation

## 2018-02-06 DIAGNOSIS — Z8673 Personal history of transient ischemic attack (TIA), and cerebral infarction without residual deficits: Secondary | ICD-10-CM | POA: Insufficient documentation

## 2018-02-06 DIAGNOSIS — I509 Heart failure, unspecified: Secondary | ICD-10-CM | POA: Insufficient documentation

## 2018-02-06 DIAGNOSIS — G459 Transient cerebral ischemic attack, unspecified: Secondary | ICD-10-CM

## 2018-02-06 DIAGNOSIS — Z87891 Personal history of nicotine dependence: Secondary | ICD-10-CM | POA: Insufficient documentation

## 2018-02-06 DIAGNOSIS — Z952 Presence of prosthetic heart valve: Secondary | ICD-10-CM

## 2018-02-06 DIAGNOSIS — Z9889 Other specified postprocedural states: Secondary | ICD-10-CM

## 2018-02-06 DIAGNOSIS — Z7901 Long term (current) use of anticoagulants: Secondary | ICD-10-CM

## 2018-02-06 LAB — POCT INR: INR: 2.1

## 2018-02-06 NOTE — Patient Instructions (Signed)
Description   Today May 14th  take 2 tablets, then continue taking 1.5 tablets everyday.  Recheck in 2 weeks. Continue eating the same amount of leafy green vegetables.  Main # [phone]

## 2018-02-21 ENCOUNTER — Encounter: Payer: Self-pay | Admitting: *Deleted

## 2018-02-21 ENCOUNTER — Ambulatory Visit (INDEPENDENT_AMBULATORY_CARE_PROVIDER_SITE_OTHER): Payer: Self-pay | Admitting: *Deleted

## 2018-02-21 ENCOUNTER — Other Ambulatory Visit: Payer: Self-pay

## 2018-02-21 DIAGNOSIS — Z7901 Long term (current) use of anticoagulants: Secondary | ICD-10-CM

## 2018-02-21 DIAGNOSIS — I059 Rheumatic mitral valve disease, unspecified: Secondary | ICD-10-CM

## 2018-02-21 LAB — POCT INR: INR: 5.4 — AB (ref 2.0–3.0)

## 2018-02-21 NOTE — Patient Instructions (Signed)
Description   Skip today dose, tomorrow only take 1/2 tablet, Eat a large serving of leafy green vegetable today, then continue taking 1.5 tablets everyday.  Recheck in 1 week. Continue eating the same amount of leafy green vegetables.  Main # [phone]

## 2018-03-02 ENCOUNTER — Ambulatory Visit (INDEPENDENT_AMBULATORY_CARE_PROVIDER_SITE_OTHER): Payer: Self-pay | Admitting: *Deleted

## 2018-03-02 DIAGNOSIS — I059 Rheumatic mitral valve disease, unspecified: Secondary | ICD-10-CM

## 2018-03-02 DIAGNOSIS — Z7901 Long term (current) use of anticoagulants: Secondary | ICD-10-CM

## 2018-03-02 LAB — POCT INR: INR: 3 (ref 2.0–3.0)

## 2018-03-02 NOTE — Patient Instructions (Signed)
Description   Continue taking 1.5 tablets everyday.  Recheck in 2 weeks. Continue eating the same amount of leafy green vegetables.  Main # [phone]

## 2018-03-07 NOTE — Progress Notes (Deleted)
Follow-up Outpatient Visit Date: 03/08/2018  Primary Care Provider: Patient, No Pcp Per No address on file  Chief Complaint: ***  HPI:  Mary [name] is a 57 y.o. 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  Postoperative course was complicated by transient complete heart block without need for permanent pacemaker placement.  She had an episode of complete heart block while asleep in 2015, though pacemaker was not recommended at that time either.  She subsequently had TIA symptoms in 05/2016 and has also struggled with sinus tachycardia and palpitations.  She did not tolerate low-dose beta-blockade due to nocturnal episodes of high-grade AV block.  No further recommendations were made by EP in 05/2016.  She presents today for follow-up of tachycardia and shortness of breath.  I last saw Ms. Balash in late April, at which time she noted frequent palpitations as well as intermittent dizziness.  She alos reported modest exertional dyspnea when walking uphill.  We agreed to obtain an echocardiogram, which showed preservedLV function with stable mitral valve prosthesis and tricuspid valve repair function.  --------------------------------------------------------------------------------------------------  Past Medical History:  Diagnosis Date  . Acute diastolic heart failure ([place]) 03/15/2013  . Anemia   . Carotid stenosis    Carotid US 2/17: bilateral ICA 1-39% >> FU prn  . Childhood asthma   . Complete heart block ([place]) 05/28/2013   Post-op  . Epigastric hernia 200's  . GERD (gastroesophageal reflux disease)   . Heart murmur   . History of blood transfusion    "14 w/1st pregnancy; 2 w/last C-section" (03/15/2013)  . Hx of echocardiogram    a. Echo 4/16:  Mild focal basal septal hypertrophy, EF 60-65%, no RWMA, Mechanical MVR ok with mild central regurgitation and  no perivalvular leak, small mobile density attached to valvular ring (1.5x1 cm) - post surgical changes vs SBE, mild LAE;   b. Echo 2/17:  Mild post wall LVH, EF 55-60%, no RWMA, mechanical MVR ok (mean 5 mmHg), normal RVSF  . Hypertension    no pcp   will go to [facility]  . 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 S/P mitral valve replacement with metallic valve 1/82/9937   75mm Sorin Carbomedics mechanical prosthesis via right mini thoracotomy approach  . 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   Complex valvuloplasty including Cor-matrix ECM patch augmentation of anterior and lateral leaflets with 24mm Edwards mc3 ring annuloplasty via right mini-thoracotomy approach  . Severe mitral regurgitation 04/22/2013  . Stroke ([place])   . SVT (supraventricular tachycardia) ([place]) 03/16/2013  . Tricuspid regurgitation    Past Surgical History:  Procedure Laterality Date  . APPENDECTOMY  [id number]  . CARDIAC CATHETERIZATION    . CARDIAC VALVE REPLACEMENT    . CESAREAN SECTION  1983  . CESAREAN SECTION WITH BILATERAL TUBAL LIGATION  1999  . FEMORAL HERNIA REPAIR Right 05/23/2013   Procedure: HERNIA REPAIR FEMORAL;  Surgeon: Rexene Alberts, MD;  Location: [place];  Service: Open Heart Surgery;  Laterality: Right;  . INTRAOPERATIVE TRANSESOPHAGEAL ECHOCARDIOGRAM N/A 05/23/2013   Procedure: INTRAOPERATIVE TRANSESOPHAGEAL ECHOCARDIOGRAM;  Surgeon: Rexene Alberts, MD;  Location: [place];  Service: Open Heart Surgery;  Laterality: N/A;  . LAPAROSCOPIC CHOLECYSTECTOMY  2003  . LEFT AND RIGHT HEART CATHETERIZATION WITH CORONARY ANGIOGRAM N/A 03/22/2013   Procedure: LEFT AND RIGHT HEART CATHETERIZATION WITH CORONARY ANGIOGRAM;  Surgeon: Burnell Blanks, MD;  Location: [facility]  CATH LAB;  Service: Cardiovascular;  Laterality: N/A;  . MINIMALLY INVASIVE TRICUSPID VALVE REPAIR Right 05/23/2013   Procedure: MINIMALLY INVASIVE TRICUSPID VALVE REPAIR;  Surgeon: Rexene Alberts, MD;  Location: [place];  Service: Open  Heart Surgery;  Laterality: Right;  . MITRAL VALVE REPLACEMENT N/A 05/23/2013   Procedure: MITRAL VALVE (MV) REPLACEMENT;  Surgeon: Rexene Alberts, MD;  Location: [place];  Service: Open Heart Surgery;  Laterality: N/A;  . MULTIPLE EXTRACTIONS WITH ALVEOLOPLASTY N/A 04/04/2013   Procedure: Extraction of tooth #'s 1,8,9,13,14,15,23,24,25,26 with alveoloplasty and gross debridement of remaining teeth;  Surgeon: Lenn Cal, DDS;  Location: [place];  Service: Oral Surgery;  Laterality: N/A;  . TEE WITHOUT CARDIOVERSION N/A 03/18/2013   Procedure: TRANSESOPHAGEAL ECHOCARDIOGRAM (TEE);  Surgeon: Larey Dresser, MD;  Location: [place];  Service: Cardiovascular;  Laterality: N/A;  . TEE WITHOUT CARDIOVERSION N/A 06/17/2013   Procedure: TRANSESOPHAGEAL ECHOCARDIOGRAM (TEE);  Surgeon: Lelon Perla, MD;  Location: [facility] ENDOSCOPY;  Service: Cardiovascular;  Laterality: N/A;  . [place]    No outpatient medications have been marked as taking for the 03/08/18 encounter (Appointment) with Zavon Hyson, Harrell Gave, MD.    Allergies: Oxycodone  Social History   Tobacco Use  . Smoking status: Former Smoker    Packs/day: 0.50    Years: 36.00    Pack years: 18.00    Types: Cigarettes    Last attempt to quit: 03/15/2013    Years since quitting: 4.9  . Smokeless tobacco: Never Used  Substance Use Topics  . Alcohol use: Yes    Comment: 01/11/2017  "glass of wine/month"  . Drug use: No    Family History  Problem Relation Age of Onset  . Cancer Mother   . Stroke Father   . Sarcoidosis Sister   . Heart attack Neg Hx     Review of Systems: A 12-system review of systems was performed and was negative except as noted in the HPI.  --------------------------------------------------------------------------------------------------  Physical Exam: LMP 03/22/2013   General:  *** HEENT: No conjunctival pallor or scleral icterus. Moist mucous membranes.  OP clear. Neck: Supple without  lymphadenopathy, thyromegaly, JVD, or HJR. No carotid bruit. Lungs: Normal work of breathing. Clear to auscultation bilaterally without wheezes or crackles. Heart: Regular rate and rhythm without murmurs, rubs, or gallops. Non-displaced PMI. Abd: Bowel sounds present. Soft, NT/ND without hepatosplenomegaly Ext: No lower extremity edema. Radial, PT, and DP pulses are 2+ bilaterally. Skin: Warm and dry without rash.  EKG:  ***  Lab Results  Component Value Date   WBC 7.1 12/18/2017   HGB 14.8 12/18/2017   HCT 44.1 12/18/2017   MCV 110.8 (H) 12/18/2017   PLT 439 (H) 12/18/2017    Lab Results  Component Value Date   NA 144 12/18/2017   K 4.7 12/18/2017   CL 111 12/18/2017   CO2 24 12/18/2017   BUN 7 12/18/2017   CREATININE 0.86 12/18/2017   GLUCOSE 98 12/18/2017   ALT 14 12/18/2017    Lab Results  Component Value Date   CHOL 212 (H) 06/06/2016   HDL 66 06/06/2016   LDLCALC 124 (H) 06/06/2016   TRIG 108 06/06/2016   CHOLHDL 3.2 06/06/2016    --------------------------------------------------------------------------------------------------  ASSESSMENT AND PLAN: Harrell Gave Evalena Fujii, MD 03/07/2018 9:02 PM

## 2018-03-08 ENCOUNTER — Other Ambulatory Visit: Payer: Self-pay

## 2018-03-08 ENCOUNTER — Ambulatory Visit: Payer: Self-pay | Admitting: Internal Medicine

## 2018-03-16 ENCOUNTER — Encounter: Payer: Self-pay | Admitting: *Deleted

## 2018-03-16 ENCOUNTER — Other Ambulatory Visit: Payer: Self-pay | Admitting: *Deleted

## 2018-03-16 ENCOUNTER — Ambulatory Visit (INDEPENDENT_AMBULATORY_CARE_PROVIDER_SITE_OTHER): Payer: Self-pay | Admitting: *Deleted

## 2018-03-16 DIAGNOSIS — I059 Rheumatic mitral valve disease, unspecified: Secondary | ICD-10-CM

## 2018-03-16 DIAGNOSIS — Z952 Presence of prosthetic heart valve: Secondary | ICD-10-CM

## 2018-03-16 DIAGNOSIS — R0602 Shortness of breath: Secondary | ICD-10-CM

## 2018-03-16 DIAGNOSIS — Z7901 Long term (current) use of anticoagulants: Secondary | ICD-10-CM

## 2018-03-16 LAB — LIPID PANEL
Chol/HDL Ratio: 3.2 ratio (ref 0.0–4.4)
Cholesterol, Total: 253 mg/dL — ABNORMAL HIGH (ref 100–199)
HDL: 78 mg/dL (ref >39)
LDL Calculated: 113 mg/dL — ABNORMAL HIGH (ref 0–99)
TRIGLYCERIDES: 312 mg/dL — AB (ref 0–149)
VLDL CHOLESTEROL CAL: 62 mg/dL — AB (ref 5–40)

## 2018-03-16 LAB — TSH: TSH: 3 u[IU]/mL (ref 0.450–4.500)

## 2018-03-16 LAB — POCT INR: INR: 4.7 — AB (ref 2.0–3.0)

## 2018-03-16 NOTE — Patient Instructions (Signed)
Description   Hold today's dose and take 1 tablet tomorrow then continue taking 1.5 tablets everyday.  Recheck in 2 weeks. Continue eating the same amount of leafy green vegetables.  Main # [phone]

## 2018-03-23 ENCOUNTER — Encounter (INDEPENDENT_AMBULATORY_CARE_PROVIDER_SITE_OTHER): Payer: Self-pay

## 2018-03-23 ENCOUNTER — Encounter: Payer: Self-pay | Admitting: Internal Medicine

## 2018-03-23 ENCOUNTER — Ambulatory Visit (INDEPENDENT_AMBULATORY_CARE_PROVIDER_SITE_OTHER): Payer: Self-pay | Admitting: Internal Medicine

## 2018-03-23 VITALS — BP 128/70 | HR 99 | Ht 62.0 in | Wt 133.2 lb

## 2018-03-23 DIAGNOSIS — I5032 Chronic diastolic (congestive) heart failure: Secondary | ICD-10-CM

## 2018-03-23 DIAGNOSIS — Z952 Presence of prosthetic heart valve: Secondary | ICD-10-CM

## 2018-03-23 DIAGNOSIS — R000 Tachycardia, unspecified: Secondary | ICD-10-CM

## 2018-03-23 NOTE — Progress Notes (Signed)
Follow-up Outpatient Visit Date: 03/23/2018  Primary Care Provider: Patient, No Pcp Per No address on file  Chief Complaint: Fatigue and palpitations  HPI:  Ms. Sanjurjo is a 57 y.o. 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  Postoperative course was complicated by transient complete heart block without need for permanent pacemaker placement.  She had an episode of complete heart block while asleep in 2015, though pacemaker was not recommended at that time either.  She subsequently had TIA symptoms in 05/2016 and has also struggled with sinus tachycardia and palpitations.  She did not tolerate low-dose beta-blockade due to nocturnal episodes of high-grade AV block.  No further recommendations were made by EP in 05/2016.  She presents today for follow-up of tachycardia and shortness of breath.  I last saw Ms. Halley in late April, at which time she noted frequent palpitations as well as intermittent dizziness.  She alos reported modest exertional dyspnea when walking uphill.  We agreed to obtain an echocardiogram, which showed preserved LV function with stable mitral valve prosthesis and tricuspid valve repair function.  Today, Ms. Cammarata reports that her symptoms are stable.  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  She also notes frequent palpitations, describing racing of the heart.  This can happen both at rest and with exertion.  She has not had any chest pain.  She denies lightheadedness and edema as well.  She has stable 3 pillow orthopnea and intermittent PND.  She remains compliant with her medications and has not had any significant bleeding on  warfarin.  --------------------------------------------------------------------------------------------------  Past Medical History:  Diagnosis Date  . Acute diastolic heart failure ([place]) 03/15/2013  . Anemia   . Carotid stenosis    Carotid US 2/17: bilateral ICA 1-39% >> FU prn  . Childhood asthma   . Complete heart block ([place]) 05/28/2013   Post-op  . Epigastric hernia 200's  . GERD (gastroesophageal reflux disease)   . Heart murmur   . History of blood transfusion    "14 w/1st pregnancy; 2 w/last C-section" (03/15/2013)  . Hx of echocardiogram    a. Echo 4/16:  Mild focal basal septal hypertrophy, EF 60-65%, no RWMA, Mechanical MVR ok with mild central regurgitation and no perivalvular leak, small mobile density attached to valvular ring (1.5x1 cm) - post surgical changes vs SBE, mild LAE;   b. Echo 2/17:  Mild post wall LVH, EF 55-60%, no RWMA, mechanical MVR ok (mean 5 mmHg), normal RVSF  . Hypertension    no pcp   will go to [facility]  . 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 S/P mitral valve replacement with metallic valve 6/38/9373   68mm Sorin Carbomedics mechanical prosthesis via right mini thoracotomy approach  . 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 Severe mitral regurgitation 04/22/2013  . Stroke ([place])   . SVT (supraventricular tachycardia) ([place]) 03/16/2013  . Tricuspid regurgitation    Past Surgical History:  Procedure Laterality Date  . APPENDECTOMY  [id number]  . CARDIAC CATHETERIZATION    . CARDIAC VALVE REPLACEMENT    . CESAREAN SECTION  1983  . CESAREAN SECTION WITH BILATERAL TUBAL  LIGATION  1999  . FEMORAL HERNIA REPAIR Right 05/23/2013   Procedure: HERNIA REPAIR FEMORAL;  Surgeon: Rexene Alberts, MD;  Location: [place];  Service: Open Heart Surgery;  Laterality: Right;  .  INTRAOPERATIVE TRANSESOPHAGEAL ECHOCARDIOGRAM N/A 05/23/2013   Procedure: INTRAOPERATIVE TRANSESOPHAGEAL ECHOCARDIOGRAM;  Surgeon: Rexene Alberts, MD;  Location: [place];  Service: Open Heart Surgery;  Laterality: N/A;  . LAPAROSCOPIC CHOLECYSTECTOMY  2003  . LEFT AND RIGHT HEART CATHETERIZATION WITH CORONARY ANGIOGRAM N/A 03/22/2013   Procedure: LEFT AND RIGHT HEART CATHETERIZATION WITH CORONARY ANGIOGRAM;  Surgeon: Burnell Blanks, MD;  Location: [facility] CATH LAB;  Service: Cardiovascular;  Laterality: N/A;  . MINIMALLY INVASIVE TRICUSPID VALVE REPAIR Right 05/23/2013   Procedure: MINIMALLY INVASIVE TRICUSPID VALVE REPAIR;  Surgeon: Rexene Alberts, MD;  Location: [place];  Service: Open Heart Surgery;  Laterality: Right;  . MITRAL VALVE REPLACEMENT N/A 05/23/2013   Procedure: MITRAL VALVE (MV) REPLACEMENT;  Surgeon: Rexene Alberts, MD;  Location: [place];  Service: Open Heart Surgery;  Laterality: N/A;  . MULTIPLE EXTRACTIONS WITH ALVEOLOPLASTY N/A 04/04/2013   Procedure: Extraction of tooth #'s 1,8,9,13,14,15,23,24,25,26 with alveoloplasty and gross debridement of remaining teeth;  Surgeon: Lenn Cal, DDS;  Location: [place];  Service: Oral Surgery;  Laterality: N/A;  . TEE WITHOUT CARDIOVERSION N/A 03/18/2013   Procedure: TRANSESOPHAGEAL ECHOCARDIOGRAM (TEE);  Surgeon: Larey Dresser, MD;  Location: [place];  Service: Cardiovascular;  Laterality: N/A;  . TEE WITHOUT CARDIOVERSION N/A 06/17/2013   Procedure: TRANSESOPHAGEAL ECHOCARDIOGRAM (TEE);  Surgeon: Lelon Perla, MD;  [facility] END[facility];  Service: Cardiovascular;  Laterality: N/A;  . TUBAL LIGATION  1999    Current Meds  Medication Sig  . acetaminophen (TYLENOL) 500 MG tablet Take 500 mg by mouth every 6 (six) hours as needed for headache.  . albuterol (PROVENTIL HFA;VENTOLIN HFA) 108 (90 Base) MCG/ACT inhaler Inhale 1-2 puffs into the lungs every 6 (six) hours as needed for wheezing or shortness of breath.  [name] aspirin EC 81  MG tablet Take 1 tablet (81 mg total) by mouth daily.  [name] warfarin (COUMADIN) 5 MG tablet Take as directed by coumadin clinic  . WITCH HAZEL EX Apply 1 application topically at bedtime as needed (knee pain).     Allergies: Oxycodone  Social History   Tobacco Use  . Smoking status: Former Smoker    Packs/day: 0.50    Years: 36.00    Pack years: 18.00    Types: Cigarettes    Last attempt to quit: 03/15/2013    Years since quitting: 5.0  . Smokeless tobacco: Never Used  Substance Use Topics  . Alcohol use: Yes    Comment: 01/11/2017  "glass of wine/month"  . Drug use: No    Family History  Problem Relation Age of Onset  . Cancer Mother   . Stroke Father   . Sarcoidosis Sister   . Heart attack Neg Hx     Review of Systems: A 12-system review of systems was performed and was negative except as noted in the HPI.  --------------------------------------------------------------------------------------------------  Physical Exam: BP 128/70   Pulse 99   Ht 5\' 2"  (1.575 m)   Wt 133 lb 4 oz (60.4 kg)   LMP 03/22/2013   SpO2 98%   BMI 24.37 kg/m   General: NAD. HEENT: No conjunctival pallor or scleral icterus. Moist mucous membranes.  OP clear. Neck: Supple without lymphadenopathy, thyromegaly, JVD, or HJR. No carotid bruit. Lungs: Normal work of breathing. Clear to  auscultation bilaterally without wheezes or crackles. Heart: Regular rate and rhythm without murmurs, rubs, or gallops. Non-displaced PMI. Abd: Bowel sounds present. Soft, NT/ND without hepatosplenomegaly Ext: No lower extremity edema. Radial, PT, and DP pulses are 2+ bilaterally. Skin: Warm and dry without rash.  EKG: NSR with occasional PVCs, rightward axis, and inferior T wave inversions.  Lab Results  Component Value Date   WBC 7.1 12/18/2017   HGB 14.8 12/18/2017   HCT 44.1 12/18/2017   MCV 110.8 (H) 12/18/2017   PLT 439 (H) 12/18/2017    Lab Results  Component Value Date   NA 144 12/18/2017   K  4.7 12/18/2017   CL 111 12/18/2017   CO2 24 12/18/2017   BUN 7 12/18/2017   CREATININE 0.86 12/18/2017   GLUCOSE 98 12/18/2017   ALT 14 12/18/2017    Lab Results  Component Value Date   CHOL 253 (H) 03/16/2018   HDL 78 03/16/2018   LDLCALC 113 (H) 03/16/2018   TRIG 312 (H) 03/16/2018   CHOLHDL 3.2 03/16/2018    --------------------------------------------------------------------------------------------------  ASSESSMENT AND PLAN: Chronic diastolic heart failure Ms. Mastrogiovanni appears euvolemic with stable weight.  Unfortunately, Ms. Severt has continued shortness of breath at rest and with exertion, continues with NYHA class for heart failure.  Her symptoms are likely multifactorial and exacerbated by her frequent tachycardia.  Recent echocardiogram showed preserved LVEF with normal function of MVR and tricuspid valve repair.  We discussed further assessment with left and right heart catheterization, though Ms. Tufano is hesitant to proceed with this.  Her EKG today does show new inferior T wave inversions.  We also discussed noninvasive ischemia testing with myocardial perfusion stress test and cardiac CTA.  Of note, left heart catheterization in 2014 showed no CAD.  Ms. Mcanany would like to defer ischemia testing at this time, given that she has been without chest pain.  I do not feel that she is significantly volume overloaded at this time to benefit from addition of a diuretic.  Sinus tachycardia Heart rate today is improved from last visit, with rate of 98 bpm.  She has a history of sinus tachycardia as well as bradycardia in the setting of transient AV block.  She has been evaluated several times by EP, though not for at least 2 years.  We have agreed to obtain a 48-hour Holter monitor and reconsider EP evaluation of significant arrhythmias are identified.  History of endocarditis status post mitral valve replacement and tricuspid valve repair Stable chronic dyspnea on exertion.  No  evidence of valve dysfunction on recent echo.  Continue current medications including SBE prophylaxis and anticoagulation with warfarin.  Follow-up: Return to clinic in 1 month.  Nelva Bush, MD 03/23/2018 2:23 PM

## 2018-03-23 NOTE — Patient Instructions (Addendum)
Medication Instructions:  Your physician recommends that you continue on your current medications as directed. Please refer to the Current Medication list given to you today.  -- If you need a refill on your cardiac medications before your next appointment, please call your pharmacy. --  Labwork: None ordered  Testing/Procedures: Your physician has recommended that you wear a 48 HR holter monitor. Holter monitors are medical devices that record the heart's electrical activity. Doctors most often use these monitors to diagnose arrhythmias. Arrhythmias are problems with the speed or rhythm of the heartbeat. The monitor is a small, portable device. You can wear one while you do your normal daily activities. This is usually used to diagnose what is causing palpitations/syncope (passing out).   Follow-Up: Your physician wants you to follow-up in: 1 MONTH with APP    Thank you for choosing [facility]!!    Any Other Special Instructions Will Be Listed Below (If Applicable).   Holter Monitoring A Holter monitor is a small device that is used to detect abnormal heart rhythms. It clips to your clothing and is connected by wires to flat, sticky disks (electrodes) that attach to your chest. It is worn continuously for 24-48 hours. Follow these instructions at home:  Wear your Holter monitor at all times, even while exercising and sleeping, for as long as directed by your health care provider.  Make sure that the Holter monitor is safely clipped to your clothing or close to your body as recommended by your health care provider.  Do not get the monitor or wires wet.  Do not put body lotion or moisturizer on your chest.  Keep your skin clean.  Keep a diary of your daily activities, such as walking and doing chores. If you feel that your heartbeat is abnormal or that your heart is fluttering or skipping a beat: ? Record what you are doing when it happens. ? Record what time of day the  symptoms occur.  Return your Holter monitor as directed by your health care provider.  Keep all follow-up visits as directed by your health care provider. This is important. Get help right away if:  You feel lightheaded or you faint.  You have trouble breathing.  You feel pain in your chest, upper arm, or jaw.  You feel sick to your stomach and your skin is pale, cool, or damp.  You heartbeat feels unusual or abnormal. This information is not intended to replace advice given to you by your health care provider. Make sure you discuss any questions you have with your health care provider. Document Released: 06/10/2004 Document Revised: 02/18/2016 Document Reviewed: 04/21/2014 Elsevier Interactive Patient Education  [name].

## 2018-03-30 ENCOUNTER — Ambulatory Visit (INDEPENDENT_AMBULATORY_CARE_PROVIDER_SITE_OTHER): Payer: Self-pay

## 2018-03-30 ENCOUNTER — Ambulatory Visit (INDEPENDENT_AMBULATORY_CARE_PROVIDER_SITE_OTHER): Payer: Self-pay | Admitting: *Deleted

## 2018-03-30 DIAGNOSIS — I5032 Chronic diastolic (congestive) heart failure: Secondary | ICD-10-CM

## 2018-03-30 DIAGNOSIS — Z7901 Long term (current) use of anticoagulants: Secondary | ICD-10-CM

## 2018-03-30 DIAGNOSIS — I059 Rheumatic mitral valve disease, unspecified: Secondary | ICD-10-CM

## 2018-03-30 DIAGNOSIS — R000 Tachycardia, unspecified: Secondary | ICD-10-CM

## 2018-03-30 LAB — POCT INR: INR: 3.8 — AB (ref 2.0–3.0)

## 2018-03-30 NOTE — Patient Instructions (Addendum)
Description   Today take 1 tablet then start taking 1.5 tablets everyday except 1 tablet on Tuesdays, Thursdays, and Saturdays.   Recheck in 2 weeks. Continue eating the same amount of leafy green vegetables.  Main # [phone]

## 2018-04-10 ENCOUNTER — Telehealth: Payer: Self-pay | Admitting: Internal Medicine

## 2018-04-10 NOTE — Telephone Encounter (Signed)
LPMTCB 7/16

## 2018-04-10 NOTE — Telephone Encounter (Signed)
Lpmtcb regarding Holter monitor results and recommendations.

## 2018-04-10 NOTE — Telephone Encounter (Signed)
I attempted to reach the patient to review the results of Holter monitor, which showed multiple arrhythmias (transient complete heart block, SVT, and NSVT).  Patient did not answer number listed in Epic.  I will have our office reach out to her again later today to discuss the findings and arrange for EP follow-up ASAP (she has seen Dr. Lovena Le in the past).  If she has significant lightheadedness/syncope, she should call 911.  Nelva Bush, MD [facility] Pager: [phone]

## 2018-04-11 ENCOUNTER — Other Ambulatory Visit: Payer: Self-pay

## 2018-04-11 DIAGNOSIS — I440 Atrioventricular block, first degree: Secondary | ICD-10-CM

## 2018-04-11 DIAGNOSIS — Z8679 Personal history of other diseases of the circulatory system: Secondary | ICD-10-CM

## 2018-04-11 DIAGNOSIS — I471 Supraventricular tachycardia: Secondary | ICD-10-CM

## 2018-04-11 NOTE — Progress Notes (Unsigned)
amb  

## 2018-04-11 NOTE — Telephone Encounter (Signed)
LPMTCB 7/17

## 2018-04-11 NOTE — Telephone Encounter (Signed)
Spoke to the patient and informed her of monitor results and Dr Darnelle Bos recommendations.  She will be awaiting a call from the EP dept.

## 2018-04-24 ENCOUNTER — Ambulatory Visit (INDEPENDENT_AMBULATORY_CARE_PROVIDER_SITE_OTHER): Payer: Self-pay | Admitting: Internal Medicine

## 2018-04-24 ENCOUNTER — Ambulatory Visit (INDEPENDENT_AMBULATORY_CARE_PROVIDER_SITE_OTHER): Payer: Self-pay | Admitting: *Deleted

## 2018-04-24 ENCOUNTER — Encounter: Payer: Self-pay | Admitting: *Deleted

## 2018-04-24 ENCOUNTER — Encounter: Payer: Self-pay | Admitting: Internal Medicine

## 2018-04-24 VITALS — BP 124/82 | HR 111 | Ht 62.0 in | Wt 129.0 lb

## 2018-04-24 DIAGNOSIS — Z7901 Long term (current) use of anticoagulants: Secondary | ICD-10-CM

## 2018-04-24 DIAGNOSIS — I5032 Chronic diastolic (congestive) heart failure: Secondary | ICD-10-CM

## 2018-04-24 DIAGNOSIS — R001 Bradycardia, unspecified: Secondary | ICD-10-CM

## 2018-04-24 DIAGNOSIS — R000 Tachycardia, unspecified: Secondary | ICD-10-CM

## 2018-04-24 DIAGNOSIS — I059 Rheumatic mitral valve disease, unspecified: Secondary | ICD-10-CM

## 2018-04-24 LAB — POCT INR: INR: 3.7 — AB (ref 2.0–3.0)

## 2018-04-24 NOTE — Patient Instructions (Addendum)
Medication Instructions:  Your physician recommends that you continue on your current medications as directed. Please refer to the Current Medication list given to you today.  Labwork: None ordered.  Testing/Procedures: Your physician has recommended that you have a pacemaker inserted. A pacemaker is a small device that is placed under the skin of your chest or abdomen to help control abnormal heart rhythms. This device uses electrical pulses to prompt the heart to beat at a normal rate. Pacemakers are used to treat heart rhythms that are too slow. Wire (leads) are attached to the pacemaker that goes into the chambers of you heart. This is done in the hospital and usually requires and overnight stay. Please see the instruction sheet given to you today for more information.  The following days are available for procedures:  August 13, 15, 20, 22, 28 and 29 September 3, 6, 9, 10, 16, 19, 23, 27 If you decide on a day please give me a call:  Bing Neighbors [phone]   Pacemaker Implantation, Adult Pacemaker implantation is a procedure to place a pacemaker inside your chest. A pacemaker is a small computer that sends electrical signals to the heart and helps your heart beat normally. A pacemaker also stores information about your heart rhythms. You may need pacemaker implantation if you:  Have a slow heartbeat (bradycardia).  Faint (syncope).  Have shortness of breath (dyspnea) due to heart problems.  The pacemaker attaches to your heart through a wire, called a lead. Sometimes just one lead is needed. Other times, there will be two leads. There are two types of pacemakers:  Transvenous pacemaker. This type is placed under the skin or muscle of your chest. The lead goes through a vein in the chest area to reach the inside of the heart.  Epicardial pacemaker. This type is placed under the skin or muscle of your chest or belly. The lead goes through your chest to the outside of the heart.  Tell  a health care provider about:  Any allergies you have.  All medicines you are taking, including vitamins, herbs, eye drops, creams, and over-the-counter medicines.  Any problems you or family members have had with anesthetic medicines.  Any blood or bone disorders you have.  Any surgeries you have had.  Any medical conditions you have.  Whether you are pregnant or may be pregnant. What are the risks? Generally, this is a safe procedure. However, problems may occur, including:  Infection.  Bleeding.  Failure of the pacemaker or the lead.  Collapse of a lung or bleeding into a lung.  Blood clot inside a blood vessel with a lead.  Damage to the heart.  Infection inside the heart (endocarditis).  Allergic reactions to medicines.  What happens before the procedure? Staying hydrated Follow instructions from your health care provider about hydration, which may include:  Up to 2 hours before the procedure - you may continue to drink clear liquids, such as water, clear fruit juice, black coffee, and plain tea.  Eating and drinking restrictions Follow instructions from your health care provider about eating and drinking, which may include:  8 hours before the procedure - stop eating heavy meals or foods such as meat, fried foods, or fatty foods.  6 hours before the procedure - stop eating light meals or foods, such as toast or cereal.  6 hours before the procedure - stop drinking milk or drinks that contain milk.  2 hours before the procedure - stop drinking clear liquids.  Medicines  Ask your health care provider about: ? Changing or stopping your regular medicines. This is especially important if you are taking diabetes medicines or blood thinners. ? Taking medicines such as aspirin and ibuprofen. These medicines can thin your blood. Do not take these medicines before your procedure if your health care provider instructs you not to.  You may be given antibiotic  medicine to help prevent infection. General instructions  You will have a heart evaluation. This may include an electrocardiogram (ECG), chest X-ray, and heart imaging (echocardiogram,  or echo) tests.  You will have blood tests.  Do not use any products that contain nicotine or tobacco, such as cigarettes and e-cigarettes. If you need help quitting, ask your health care provider.  Plan to have someone take you home from the hospital or clinic.  If you will be going home right after the procedure, plan to have someone with you for 24 hours.  Ask your health care provider how your surgical site will be marked or identified. What happens during the procedure?  To reduce your risk of infection: ? Your health care team will wash or sanitize their hands. ? Your skin will be washed with soap. ? Hair may be removed from the surgical area.  An IV tube will be inserted into one of your veins.  You will be given one or more of the following: ? A medicine to help you relax (sedative). ? A medicine to numb the area (local anesthetic). ? A medicine to make you fall asleep (general anesthetic).  If you are getting a transvenous pacemaker: ? An incision will be made in your upper chest. ? A pocket will be made for the pacemaker. It may be placed under the skin or between layers of muscle. ? The lead will be inserted into a blood vessel that returns to the heart. ? While X-rays are taken by an imaging machine (fluoroscopy), the lead will be advanced through the vein to the inside of your heart. ? The other end of the lead will be tunneled under the skin and attached to the pacemaker.  If you are getting an epicardial pacemaker: ? An incision will be made near your ribs or breastbone (sternum) for the lead. ? The lead will be attached to the outside of your heart. ? Another incision will be made in your chest or upper belly to create a pocket for the pacemaker. ? The free end of the lead will  be tunneled under the skin and attached to the pacemaker.  The transvenous or epicardial pacemaker will be tested. Imaging studies may be done to check the lead position.  The incisions will be closed with stitches (sutures), adhesive strips, or skin glue.  Bandages (dressing) will be placed over the incisions. The procedure may vary among health care providers and hospitals. What happens after the procedure?  Your blood pressure, heart rate, breathing rate, and blood oxygen level will be monitored until the medicines you were given have worn off.  You will be given antibiotics and pain medicine.  ECG and chest x-rays will be done.  You will wear a continuous type of ECG (Holter monitor) to check your heart rhythm.  Your health care provider willprogram the pacemaker.  Do not drive for 24 hours if you received a sedative. This information is not intended to replace advice given to you by your health care provider. Make sure you discuss any questions you have with your health care provider. Document Released: 09/02/2002 Document  Revised: 04/01/2016 Document Reviewed: 02/24/2016 Elsevier Interactive Patient Education  Folkert Schein.

## 2018-04-24 NOTE — Progress Notes (Addendum)
HPI Mary [name] is re-referred today by Dr. Saunders Revel for evaluation of worsening heart block. She is a pleasant 57 yo woman who has a h/o MVR and TV repair who has had recurrent episodes of near syncope.  She has worn a cardiac monitor and had evidence of transient AV block with pauses of 4-5 seconds during the daytime. She also has documented either sinus tachy or atrial tachycardia with 1:1 conduction. She is on no AV nodal blocking drugs.  Allergies  Allergen Reactions  . Oxycodone Itching     Current Outpatient Medications  Medication Sig Dispense Refill  . acetaminophen (TYLENOL) 500 MG tablet Take 500 mg by mouth every 6 (six) hours as needed for headache.    [name] aspirin EC 81 MG tablet Take 1 tablet (81 mg total) by mouth daily.    [name] warfarin (COUMADIN) 5 MG tablet Take as directed by coumadin clinic 50 tablet 2  . WITCH HAZEL EX Apply 1 application topically at bedtime as needed (knee pain).      No current facility-administered medications for this visit.      Past Medical History:  Diagnosis Date  . Acute diastolic heart failure ([place]) 03/15/2013  . Anemia   . Carotid stenosis    Carotid US 2/17: bilateral ICA 1-39% >> FU prn  . Childhood asthma   . Complete heart block ([place]) 05/28/2013   Post-op  . Epigastric hernia 200's  . GERD (gastroesophageal reflux disease)   . Heart murmur   . History of blood transfusion    "14 w/1st pregnancy; 2 w/last C-section" (03/15/2013)  . Hx of echocardiogram    a. Echo 4/16:  Mild focal basal septal hypertrophy, EF 60-65%, no RWMA, Mechanical MVR ok with mild central regurgitation and no perivalvular leak, small mobile density attached to valvular ring (1.5x1 cm) - post surgical changes vs SBE, mild LAE;   b. Echo 2/17:  Mild post wall LVH, EF 55-60%, no RWMA, mechanical MVR ok (mean 5 mmHg), normal RVSF  . Hypertension    no pcp   will go to [facility]  . 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 S/P mitral valve replacement  with metallic valve 11/19/8248   24mm Sorin Carbomedics mechanical prosthesis via right mini thoracotomy approach  . 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 Severe mitral regurgitation 04/22/2013  . Stroke ([place])   . SVT (supraventricular tachycardia) ([place]) 03/16/2013  . Tricuspid regurgitation     ROS:   All systems reviewed and negative except as noted in the HPI.   Past Surgical History:  Procedure Laterality Date  . APPENDECTOMY  [id number]  . CARDIAC CATHETERIZATION    . CARDIAC VALVE REPLACEMENT    . CESAREAN SECTION  1983  . CESAREAN SECTION WITH BILATERAL TUBAL LIGATION  1999  . FEMORAL HERNIA REPAIR Right 05/23/2013   Procedure: HERNIA REPAIR FEMORAL;  Surgeon: Rexene Alberts, MD;  Location: [facility];  Service: Open Heart Surgery;  Laterality: Right;  . INTRAOPERATIVE TRANSESOPHAGEAL ECHOCARDIOGRAM N/A 05/23/2013   Procedure: INTRAOPERATIVE TRANSESOPHAGEAL ECHOCARDIOGRAM;  Surgeon: Rexene Alberts, MD;  Location: [facility];  Service: Open Heart Surgery;  Laterality: N/A;  . LAPAROSCOPIC CHOLECYSTECTOMY  2003  . LEFT AND RIGHT HEART CATHETERIZATION WITH CORONARY ANGIOGRAM N/A 03/22/2013   Procedure: LEFT AND RIGHT HEART CATHETERIZATION WITH CORONARY ANGIOGRAM;  Surgeon: Harrell Gave  Santina Evans, MD;  Location: [facility];  Service: Cardiovascular;  Laterality: N/A;  . MINIMALLY INVASIVE TRICUSPID VALVE REPAIR Right 05/23/2013   Procedure: MINIMALLY INVASIVE TRICUSPID VALVE REPAIR;  Surgeon: Rexene Alberts, MD;  Location: [facility];  Service: Open Heart Surgery;  Laterality: Right;  . MITRAL VALVE REPLACEMENT N/A 05/23/2013   Procedure: MITRAL VALVE (MV) REPLACEMENT;  Surgeon: Rexene Alberts, MD;  Location: [facility];  Service: Open Heart Surgery;  Laterality: N/A;  . MULTIPLE EXTRACTIONS WITH ALVEOLOPLASTY N/A 04/04/2013    Procedure: Extraction of tooth #'s 1,8,9,13,14,15,23,24,25,26 with alveoloplasty and gross debridement of remaining teeth;  Surgeon: Lenn Cal, DDS;  Location: [facility];  Service: Oral Surgery;  Laterality: N/A;  . TEE WITHOUT CARDIOVERSION N/A 03/18/2013   Procedure: TRANSESOPHAGEAL ECHOCARDIOGRAM (TEE);  Surgeon: Larey Dresser, MD;  Location: [facility];  Service: Cardiovascular;  Laterality: N/A;  . TEE WITHOUT CARDIOVERSION N/A 06/17/2013   Procedure: TRANSESOPHAGEAL ECHOCARDIOGRAM (TEE);  Surgeon: Lelon Perla, MD;  Location: [facility] END[facility]COPY;  Service: Cardiovascular;  Laterality: N/A;  . TUBAL LIGATION  1999     Family History  Problem Relation Age of Onset  . Cancer Mother   . Stroke Father   . Sarcoidosis Sister   . Heart attack Neg Hx      Social History   Socioeconomic History  . Marital status: Single    Spouse name: Not on file  . Number of children: Not on file  . Years of education: Not on file  . Highest education level: Not on file  Occupational History  . Not on file  Social Needs  . Financial resource strain: Not on file  . Food insecurity:    Worry: Not on file    Inability: Not on file  . Transportation needs:    Medical: Not on file    Non-medical: Not on file  Tobacco Use  . Smoking status: Former Smoker    Packs/day: 0.50    Years: 36.00    Pack years: 18.00    Types: Cigarettes    Last attempt to quit: 03/15/2013    Years since quitting: 5.1  . Smokeless tobacco: Never Used  Substance and Sexual Activity  . Alcohol use: Yes    Comment: 01/11/2017  "glass of wine/month"  . Drug use: No  . Sexual activity: Never  Lifestyle  . Physical activity:    Days per week: Not on file    Minutes per session: Not on file  . Stress: Not on file  Relationships  . Social connections:    Talks on phone: Not on file    Gets together: Not on file    Attends religious service: Not on file    Active member of club or organization: Not on file     Attends meetings of clubs or organizations: Not on file    Relationship status: Not on file  . Intimate partner violence:    Fear of current or ex partner: Not on file    Emotionally abused: Not on file    Physically abused: Not on file    Forced sexual activity: Not on file  Other Topics Concern  . Not on file  Social History Narrative  . Not on file     BP 124/82   Pulse (!) 111   Ht 5\' 2"  (1.575 m)   Wt 129 lb (58.5 kg)   LMP 03/22/2013   SpO2 99%   BMI 23.59 kg/m   Physical Exam:  Well appearing 57 yo woman, NAD HEENT: Unremarkable Neck:  6 cm JVD, no thyromegally Lymphatics:  No adenopathy Back:  No CVA tenderness Lungs:  Clear with no wheezes HEART:  Regular rate rhythm, no murmurs, no rubs, no clicks Abd:  soft, positive bowel sounds, no organomegally, no rebound, no guarding Ext:  2 plus pulses, no edema, no cyanosis, no clubbing Skin:  No rashes no nodules Neuro:  CN II through XII intact, motor grossly intact  EKG - NSR with no pre-excitation  DEVICE  Normal device function.  See PaceArt for details.   Assess/Plan: 1. Transient AV block - she is now symptomatic and has prolonged pauses. She is on no AV nodal blocking drugs. I have discussed the indications/risks/benefits/goals/expectations of PPM insertion with the patient and she will call us when she wishes to proceed.  2. HTN - after her ablation, she will need treatment for her HTN with the potential to use beta blockers.  I spent over 25 minutes including 50% face to face time.  Mikle Bosworth.D.

## 2018-04-24 NOTE — Addendum Note (Signed)
Addended by: Evans Lance on: 04/24/2018 06:02 PM   Modules accepted: Level of Service

## 2018-04-24 NOTE — Patient Instructions (Addendum)
Description   Today take 1/2 tablet then continue taking 1.5 tablets everyday except 1 tablet on Tuesdays, Thursdays, and Saturdays.  Recheck in 2 weeks. Continue eating the same amount of leafy green vegetables.  Main # [phone]

## 2018-05-02 ENCOUNTER — Ambulatory Visit: Payer: Self-pay | Admitting: Physician Assistant

## 2018-05-09 ENCOUNTER — Encounter: Payer: Self-pay | Admitting: Physician Assistant

## 2018-05-23 ENCOUNTER — Inpatient Hospital Stay (HOSPITAL_COMMUNITY)
Admission: EM | Admit: 2018-05-23 | Discharge: 2018-05-25 | DRG: 683 | Disposition: A | Discharge status: Home / Self Care | Payer: Self-pay | Attending: Family Medicine | Admitting: Family Medicine

## 2018-05-23 ENCOUNTER — Other Ambulatory Visit: Payer: Self-pay

## 2018-05-23 ENCOUNTER — Observation Stay (HOSPITAL_COMMUNITY): Payer: Self-pay

## 2018-05-23 DIAGNOSIS — Z823 Family history of stroke: Secondary | ICD-10-CM

## 2018-05-23 DIAGNOSIS — I472 Ventricular tachycardia: Secondary | ICD-10-CM | POA: Diagnosis present

## 2018-05-23 DIAGNOSIS — K219 Gastro-esophageal reflux disease without esophagitis: Secondary | ICD-10-CM | POA: Diagnosis present

## 2018-05-23 DIAGNOSIS — K529 Noninfective gastroenteritis and colitis, unspecified: Secondary | ICD-10-CM | POA: Diagnosis present

## 2018-05-23 DIAGNOSIS — I110 Hypertensive heart disease with heart failure: Secondary | ICD-10-CM | POA: Diagnosis present

## 2018-05-23 DIAGNOSIS — R197 Diarrhea, unspecified: Secondary | ICD-10-CM

## 2018-05-23 DIAGNOSIS — I471 Supraventricular tachycardia: Secondary | ICD-10-CM | POA: Diagnosis present

## 2018-05-23 DIAGNOSIS — Z8673 Personal history of transient ischemic attack (TIA), and cerebral infarction without residual deficits: Secondary | ICD-10-CM

## 2018-05-23 DIAGNOSIS — Z952 Presence of prosthetic heart valve: Secondary | ICD-10-CM

## 2018-05-23 DIAGNOSIS — N179 Acute kidney failure, unspecified: Principal | ICD-10-CM | POA: Diagnosis present

## 2018-05-23 DIAGNOSIS — Z7982 Long term (current) use of aspirin: Secondary | ICD-10-CM

## 2018-05-23 DIAGNOSIS — E860 Dehydration: Secondary | ICD-10-CM | POA: Diagnosis present

## 2018-05-23 DIAGNOSIS — E869 Volume depletion, unspecified: Secondary | ICD-10-CM | POA: Diagnosis present

## 2018-05-23 DIAGNOSIS — Z954 Presence of other heart-valve replacement: Secondary | ICD-10-CM

## 2018-05-23 DIAGNOSIS — J189 Pneumonia, unspecified organism: Secondary | ICD-10-CM

## 2018-05-23 DIAGNOSIS — I5032 Chronic diastolic (congestive) heart failure: Secondary | ICD-10-CM | POA: Diagnosis present

## 2018-05-23 DIAGNOSIS — Z7901 Long term (current) use of anticoagulants: Secondary | ICD-10-CM

## 2018-05-23 DIAGNOSIS — I5030 Unspecified diastolic (congestive) heart failure: Secondary | ICD-10-CM | POA: Diagnosis present

## 2018-05-23 DIAGNOSIS — Z87891 Personal history of nicotine dependence: Secondary | ICD-10-CM

## 2018-05-23 DIAGNOSIS — R112 Nausea with vomiting, unspecified: Secondary | ICD-10-CM

## 2018-05-23 LAB — URINALYSIS, ROUTINE W REFLEX MICROSCOPIC
BILIRUBIN URINE: NEGATIVE
Bacteria, UA: NONE SEEN
GLUCOSE, UA: NEGATIVE mg/dL
KETONES UR: 5 mg/dL — AB
Nitrite: NEGATIVE
PH: 5 (ref 5.0–8.0)
Protein, ur: >=300 mg/dL — AB
Specific Gravity, Urine: 1.019 (ref 1.005–1.030)

## 2018-05-23 LAB — CBC WITH DIFFERENTIAL/PLATELET
BASOS ABS: 0 10*3/uL (ref 0.0–0.1)
Basophils Relative: 0 %
EOS ABS: 0.2 10*3/uL (ref 0.0–0.7)
Eosinophils Relative: 3 %
HCT: 38.2 % (ref 36.0–46.0)
HEMOGLOBIN: 13.5 g/dL (ref 12.0–15.0)
LYMPHS ABS: 1.4 10*3/uL (ref 0.7–4.0)
Lymphocytes Relative: 16 %
MCH: 36.5 pg — AB (ref 26.0–34.0)
MCHC: 35.3 g/dL (ref 30.0–36.0)
MCV: 103.2 fL — ABNORMAL HIGH (ref 78.0–100.0)
Monocytes Absolute: 0.5 10*3/uL (ref 0.1–1.0)
Monocytes Relative: 6 %
NEUTROS PCT: 75 %
Neutro Abs: 6.6 10*3/uL (ref 1.7–7.7)
Platelets: 417 10*3/uL — ABNORMAL HIGH (ref 150–400)
RBC: 3.7 MIL/uL — AB (ref 3.87–5.11)
RDW: 14.2 % (ref 11.5–15.5)
WBC: 8.7 10*3/uL (ref 4.0–10.5)

## 2018-05-23 LAB — COMPREHENSIVE METABOLIC PANEL
ALK PHOS: 129 U/L — AB (ref 38–126)
ALT: 17 U/L (ref 0–44)
AST: 33 U/L (ref 15–41)
Albumin: 3.5 g/dL (ref 3.5–5.0)
Anion gap: 13 (ref 5–15)
BUN: 16 mg/dL (ref 6–20)
CALCIUM: 9.1 mg/dL (ref 8.9–10.3)
CO2: 19 mmol/L — ABNORMAL LOW (ref 22–32)
CREATININE: 2.47 mg/dL — AB (ref 0.44–1.00)
Chloride: 106 mmol/L (ref 98–111)
GFR calc non Af Amer: 21 mL/min — ABNORMAL LOW (ref >60)
GFR, EST AFRICAN AMERICAN: 24 mL/min — AB (ref >60)
GLUCOSE: 97 mg/dL (ref 70–99)
Potassium: 4.5 mmol/L (ref 3.5–5.1)
SODIUM: 138 mmol/L (ref 135–145)
Total Bilirubin: 0.5 mg/dL (ref 0.3–1.2)
Total Protein: 7.3 g/dL (ref 6.5–8.1)

## 2018-05-23 LAB — LIPASE, BLOOD: Lipase: 27 U/L (ref 11–51)

## 2018-05-23 LAB — PROTIME-INR
INR: 3.44
Prothrombin Time: 34.4 seconds — ABNORMAL HIGH (ref 11.4–15.2)

## 2018-05-23 LAB — LACTIC ACID, PLASMA: Lactic Acid, Venous: 1.3 mmol/L (ref 0.5–1.9)

## 2018-05-23 MED ORDER — ACETAMINOPHEN 650 MG RE SUPP: 650.0000 mg | Freq: Four times a day (QID) | RECTAL | Status: DC | PRN

## 2018-05-23 MED ORDER — METRONIDAZOLE IN NACL 5-0.79 MG/ML-% IV SOLN
500.0000 mg | Freq: Three times a day (TID) | INTRAVENOUS | Status: DC
  Administered 2018-05-23 – 2018-05-24 (×3): 500 mg via INTRAVENOUS
  Filled 2018-05-23 (×3): qty 100

## 2018-05-23 MED ORDER — IPRATROPIUM BROMIDE 0.02 % IN SOLN: 0.5000 mg | Freq: Four times a day (QID) | RESPIRATORY_TRACT | Status: DC

## 2018-05-23 MED ORDER — ALBUTEROL SULFATE (2.5 MG/3ML) 0.083% IN NEBU: 2.5000 mg | INHALATION_SOLUTION | RESPIRATORY_TRACT | Status: DC | PRN

## 2018-05-23 MED ORDER — SODIUM CHLORIDE 0.9 % IV SOLN
2.0000 g | INTRAVENOUS | Status: DC
  Administered 2018-05-23: 2 g via INTRAVENOUS
  Filled 2018-05-23 (×2): qty 20

## 2018-05-23 MED ORDER — ONDANSETRON HCL 4 MG PO TABS: 4.0000 mg | ORAL_TABLET | Freq: Four times a day (QID) | ORAL | Status: DC | PRN

## 2018-05-23 MED ORDER — WARFARIN - PHARMACIST DOSING INPATIENT: Freq: Every day | Status: DC

## 2018-05-23 MED ORDER — ACETAMINOPHEN 325 MG PO TABS: 650.0000 mg | ORAL_TABLET | Freq: Four times a day (QID) | ORAL | Status: DC | PRN

## 2018-05-23 MED ORDER — ONDANSETRON HCL 4 MG/2ML IJ SOLN
4.0000 mg | Freq: Once | INTRAMUSCULAR | Status: DC
Start: 2018-05-23 — End: 2018-05-25

## 2018-05-23 MED ORDER — SODIUM CHLORIDE 0.9 % IV BOLUS
1000.0000 mL | Freq: Once | INTRAVENOUS | Status: AC
  Administered 2018-05-23: 1000 mL via INTRAVENOUS

## 2018-05-23 MED ORDER — WARFARIN SODIUM 5 MG PO TABS
5.0000 mg | ORAL_TABLET | Freq: Once | ORAL | Status: AC
  Administered 2018-05-23: 5 mg via ORAL
  Filled 2018-05-23 (×2): qty 1

## 2018-05-23 MED ORDER — MORPHINE SULFATE (PF) 2 MG/ML IV SOLN: 1.0000 mg | INTRAVENOUS | Status: DC | PRN

## 2018-05-23 MED ORDER — ONDANSETRON HCL 4 MG/2ML IJ SOLN: 4.0000 mg | Freq: Four times a day (QID) | INTRAMUSCULAR | Status: DC | PRN

## 2018-05-23 MED ORDER — ASPIRIN EC 81 MG PO TBEC
81.0000 mg | DELAYED_RELEASE_TABLET | Freq: Every day | ORAL | Status: DC
  Administered 2018-05-24 – 2018-05-25 (×2): 81 mg via ORAL
  Filled 2018-05-23 (×2): qty 1

## 2018-05-23 MED ORDER — SODIUM CHLORIDE 0.9 % IV SOLN
INTRAVENOUS | Status: DC
  Administered 2018-05-23 – 2018-05-24 (×2): via INTRAVENOUS

## 2018-05-23 NOTE — ED Notes (Signed)
Pt ambulated to restroom. UA obtained, cultured and sent to lab

## 2018-05-23 NOTE — ED Notes (Signed)
Hospitalist at bedside 

## 2018-05-23 NOTE — ED Notes (Signed)
ED TO INPATIENT HANDOFF REPORT  Name/Age/Gender Mary [name] 57 y.o. female  Code Status    Code Status Orders  (From admission, onward)         Start     Ordered   05/23/18 1334  Full code  Continuous     05/23/18 1333        Code Status History    Date Active Date Inactive Code Status Order ID Comments User Context   04/18/2017 1456 04/19/2017 1804 Full Code [id number]  Verner Mould, MD ED   01/11/2017 1237 01/13/2017 1720 Full Code [id number]  Florinda Marker, MD Inpatient   06/06/2016 0108 06/09/2016 1741 Full Code [id number]  Lily Kocher, MD ED   01/10/2016 0726 01/13/2016 1719 Full Code [id number]  Radene Gunning, NP Inpatient   09/13/2015 0449 09/14/2015 0036 Full Code [id number]  Reubin Milan, MD Inpatient   06/15/2013 2324 06/18/2013 2023 Full Code [id number]  Hilton Sinclair, MD Inpatient   05/26/2013 1109 06/04/2013 1436 Full Code [id number]  Rexene Alberts, MD Inpatient   05/23/2013 1549 05/26/2013 1109 Full Code [id number]  Rexene Alberts, MD Inpatient      Home/SNF/Other Home  Chief Complaint N/V/D  Level of Care/Admitting Diagnosis ED Disposition    ED Disposition Condition [facility]: [facility] [[id number]]  Level of Care: Telemetry [5]  Admit to tele based on following criteria: Complex arrhythmia (Bradycardia/Tachycardia)  Diagnosis: Acute kidney injury [facility]) [[id number]]  Admitting Physician: Mendel Corning [[id number]]  Attending Physician: RAI, RIPUDEEP K [[id number]]  PT Class (Do Not Modify): Observation [104]  PT Acc Code (Do Not Modify): Observation [10022]       Medical History Past Medical History:  Diagnosis Date  . Acute diastolic heart failure ([place]) 03/15/2013  . Anemia   . Carotid stenosis    Carotid US 2/17: bilateral ICA 1-39% >> FU prn  . Childhood asthma   . Complete heart block ([place]) 05/28/2013   Post-op  . Epigastric hernia 200's  . GERD (gastroesophageal reflux disease)   . Heart murmur    . History of blood transfusion    "14 w/1st pregnancy; 2 w/last C-section" (03/15/2013)  . Hx of echocardiogram    a. Echo 4/16:  Mild focal basal septal hypertrophy, EF 60-65%, no RWMA, Mechanical MVR ok with mild central regurgitation and no perivalvular leak, small mobile density attached to valvular ring (1.5x1 cm) - post surgical changes vs SBE, mild LAE;   b. Echo 2/17:  Mild post wall LVH, EF 55-60%, no RWMA, mechanical MVR ok (mean 5 mmHg), normal RVSF  . Hypertension    no pcp   will go to [facility]  . 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 S/P mitral valve replacement with metallic valve 8/93/8101   15m Sorin Carbomedics mechanical prosthesis via right mini thoracotomy approach  . 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 Severe mitral regurgitation 04/22/2013  . [place]roke [place]  . SVT (supra[place]ricular tachycardia) [place]/2014  . Tricuspid regurgitation     Allergies Allergies  Allergen Reactions  . Oxycodone Itching    IV Location/Drains/Wounds Patient Lines/Drains/Airways Status   Active Line/Drains/Airways    Name:   Placement date:   Placement time:   Site:   Days:   Peripheral IV 05/23/18 Left Antecubital  05/23/18    -    Antecubital   less than 1          Labs/Imaging Results for orders placed or performed during the hospital encounter of 05/23/18 (from the past 48 hour(s))  Comprehensive metabolic panel     Status: Abnormal   Collection Time: 05/23/18 11:41 AM  Result Value Ref Range   Sodium 138 135 - 145 mmol/L   Potassium 4.5 3.5 - 5.1 mmol/L   Chloride 106 98 - 111 mmol/L   CO2 19 (L) 22 - 32 mmol/L   Glucose, Bld 97 70 - 99 mg/dL   BUN 16 6 - 20 mg/dL   Creatinine, Ser 2.47 (H) 0.44 - 1.00 mg/dL   Calcium 9.1 8.9 - 10.3 mg/dL   Total Protein 7.3 6.5 - 8.1  g/dL   Albumin 3.5 3.5 - 5.0 g/dL   AST 33 15 - 41 U/L   ALT 17 0 - 44 U/L   Alkaline Phosphatase 129 (H) 38 - 126 U/L   Total Bilirubin 0.5 0.3 - 1.2 mg/dL   GFR calc non Af Amer 21 (L) >60 mL/min   GFR calc Af Amer 24 (L) >60 mL/min    Comment: (NOTE) The eGFR has been calculated using the CKD EPI equation. This calculation has not been validated in all clinical situations. eGFR's persistently <60 mL/min signify possible Chronic Kidney Disease.    Anion gap 13 5 - 15    Comment: Performed at [facility], [place] [address], [place], [place] [address]  Lipase, blood     Status: None   Collection Time: 05/23/18 11:41 AM  Result Value Ref Range   Lipase 27 11 - 51 U/L    Comment: Performed at [facility], [place] [address] [place], [place] [address]  CBC with Differential     Status: Abnormal   Collection Time: 05/23/18 11:41 AM  Result Value Ref Range   WBC 8.7 4.0 - 10.5 K/uL   RBC 3.70 (L) 3.87 - 5.11 MIL/uL   Hemoglobin 13.5 12.0 - 15.0 g/dL   HCT 38.2 36.0 - 46.0 %   MCV 103.2 (H) 78.0 - 100.0 fL   MCH 36.5 (H) 26.0 - 34.0 pg   MCHC 35.3 30.0 - 36.0 g/dL   RDW 14.2 11.5 - 15.5 %   Platelets 417 (H) 150 - 400 K/uL   Neutrophils Relative % 75 %   Neutro Abs 6.6 1.7 - 7.7 K/uL   Lymphocytes Relative 16 %   Lymphs Abs 1.4 0.7 - 4.0 K/uL   Monocytes Relative 6 %   Monocytes Absolute 0.5 0.1 - 1.0 K/uL   Eosinophils Relative 3 %   Eosinophils Absolute 0.2 0.0 - 0.7 K/uL   Basophils Relative 0 %   Basophils Absolute 0.0 0.0 - 0.1 K/uL    Comment: Performed at [facility], [place] [address], [place], [place] [address]  Protime-INR     Status: Abnormal   Collection Time: 05/23/18 11:41 AM  Result Value Ref Range   Prothrombin Time 34.4 (H) 11.4 - 15.2 seconds   INR 3.44     Comment: Performed at [facility], [place] [address], [place], [place] [address]  Urinalysis, Routine w reflex microscopic      Status: Abnormal   Collection Time: 05/23/18 12:45 PM  Result Value Ref Range   Color, Urine AMBER (A) YELLOW    Comment: BIOCHEMICALS MAY BE AFFECTED BY COLOR   APPearance CLOUDY (  A) CLEAR   Specific Gravity, Urine 1.019 1.005 - 1.030   pH 5.0 5.0 - 8.0   Glucose, UA NEGATIVE NEGATIVE mg/dL   Hgb urine dipstick LARGE (A) NEGATIVE   Bilirubin Urine NEGATIVE NEGATIVE   Ketones, ur 5 (A) NEGATIVE mg/dL   Protein, ur >=300 (A) NEGATIVE mg/dL   Nitrite NEGATIVE NEGATIVE   Leukocytes, UA TRACE (A) NEGATIVE   RBC / HPF >50 (H) 0 - 5 RBC/hpf   WBC, UA >50 (H) 0 - 5 WBC/hpf   Bacteria, UA NONE SEEN NONE SEEN   Squamous Epithelial / LPF 11-20 0 - 5   Mucus PRESENT    Hyaline Casts, UA PRESENT     Comment: Performed at [facility], [place] [address] [place], [place] [address]   No results found.  Pending Labs Unresulted Labs (From admission, onward)    Start     Ordered   05/24/18 5409  Basic metabolic panel  Tomorrow morning,   R     05/23/18 1333   05/24/18 0500  CBC  Tomorrow morning,   R     05/23/18 1333   05/24/18 0500  Protime-INR  Daily,   R     05/23/18 1405   05/23/18 1334  HIV antibody (Routine Testing)  Once,   R     05/23/18 1333   05/23/18 1327  Gastrointestinal Panel by PCR , Stool  (Gastrointestinal Panel by PCR, Stool)  Once,   R     05/23/18 1327   05/23/18 1325  Lactic acid, plasma  Once,   STAT     05/23/18 1324          Vitals/Pain Today's Vitals   05/23/18 1330 05/23/18 1331 05/23/18 1345 05/23/18 1400  BP: 123/84 115/81  (!) 125/94  Pulse:  (!) 101    Resp: 19 (!) '30 15 14  '$ Temp:      TempSrc:      SpO2:  100%    Weight:      PainSc:        Isolation Precautions Enteric precautions (UV disinfection)  Medications Medications  ondansetron (ZOFRAN) injection 4 mg (0 mg Intravenous Hold 05/23/18 1144)  0.9 %  sodium chloride infusion (has no administration in time range)  metroNIDAZOLE (FLAGYL) IVPB 500 mg (has no  administration in time range)  cefTRIAXone (ROCEPHIN) 2 g in sodium chloride 0.9 % 100 mL IVPB (has no administration in time range)  aspirin EC tablet 81 mg (has no administration in time range)  acetaminophen (TYLENOL) tablet 650 mg (has no administration in time range)    Or  acetaminophen (TYLENOL) suppository 650 mg (has no administration in time range)  ondansetron (ZOFRAN) tablet 4 mg (has no administration in time range)    Or  ondansetron (ZOFRAN) injection 4 mg (has no administration in time range)  morphine 2 MG/ML injection 1-2 mg (has no administration in time range)  albuterol (PROVENTIL) (2.5 MG/3ML) 0.083% nebulizer solution 2.5 mg (has no administration in time range)  warfarin (COUMADIN) tablet 5 mg (has no administration in time range)  Warfarin - Pharmacist Dosing Inpatient (has no administration in time range)  sodium chloride 0.9 % bolus 1,000 mL (0 mLs Intravenous Stopped 05/23/18 1355)    Mobility walks

## 2018-05-23 NOTE — H&P (Signed)
History and Physical        Hospital Admission Note Date: 05/23/2018  Patient name: Mary [name] Medical record number: [id number] Date of birth: 1961-01-07 Age: 57 y.o. Gender: female  PCP: Patient, No Pcp Per    Patient coming from:   I have reviewed all records in the [facility].    Chief Complaint:  Nausea, vomiting, diarrhea for last 6 days  HPI: Patient is a 57 year old female with history of chronic diastolic CHF, carotid stenosis, history of rheumatic mitral stenosis with regurgitation, mechanical heart valve, GERD, CVA, SVT presented to ED tractable nausea, vomiting, diarrhea for the last 6 days.  Patient reported that her symptoms started 6 days ago, on Thursday last week when she ate chicken which she cooked herself and brought from [employer] with plantains.  Soon after eating the dish, she started having abdominal cramping with nausea, vomiting and diarrhea.  Patient reports that she has been vomiting at least 5-6 times in a day with diarrhea every hour.  Patient has not been able to eat anything in the last 5 days, today when she tried to get out of the house to get some ginger ale from the store.  She felt very dizzy and lightheaded and almost passed out.  She called EMS.  She denied any hematochezia or melena however reported that she was having mild low-grade fevers.  No new medications or antibiotics recently.   ED work-up/course:  Temp 98.1, pulse 120, BP 99/73, O2 sats 100% on room air, respiratory rate 12 CBC unremarkable, BUN 16, creatinine 2.47, LFTs normal.  Baseline creatinine 0.8  Review of Systems: Positives marked in 'bold' Constitutional: + fever, chills, diaphoresis, poor appetite and fatigue.  HEENT: Denies photophobia, eye pain, redness, hearing loss, ear pain, congestion, sore throat, rhinorrhea, sneezing, mouth sores, trouble  swallowing, neck pain, neck stiffness and tinnitus.   Respiratory: Denies SOB, DOE, cough, chest tightness,  and wheezing.   Cardiovascular: Denies chest pain, palpitations and leg swelling.  Gastrointestinal: Please see HPI  Genitourinary: Denies dysuria, urgency, frequency, hematuria, flank pain and difficulty urinating.  Musculoskeletal: Denies myalgias, back pain, joint swelling, arthralgias and gait problem.  Skin: Denies pallor, rash and wound.  Neurological: Denies seizures, syncope,  numbness and headaches. + Lightheadedness, dizziness, generalized weakness Hematological: Denies adenopathy. Easy bruising, personal or family bleeding history  Psychiatric/Behavioral: Denies suicidal ideation, mood changes, confusion, nervousness, sleep disturbance and agitation  Past Medical History: Past Medical History:  Diagnosis Date  . Acute diastolic heart failure (HCC) 03/15/2013  . Anemia   . Carotid stenosis    Carotid US 2/17: bilateral ICA 1-39% >> FU prn  . Childhood asthma   . Complete heart block (HCC) 05/28/2013   Post-op  . Epigastric hernia 200's  . GERD (gastroesophageal reflux disease)   . Heart murmur   . History of blood transfusion    "14 w/1st pregnancy; 2 w/last C-section" (03/15/2013)  . Hx of echocardiogram    a. Echo 4/16:  Mild focal basal septal hypertrophy, EF 60-65%, no RWMA, Mechanical MVR ok with mild central regurgitation and no perivalvular leak, small mobile density attached to valvular ring (1.5x1 cm) - post  surgical changes vs SBE, mild LAE;   b. Echo 2/17:  Mild post wall LVH, EF 55-60%, no RWMA, mechanical MVR ok (mean 5 mmHg), normal RVSF  . Hypertension    no pcp   will go to [facility]  . 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   33mm Sorin Carbomedics mechanical prosthesis via right mini thoracotomy approach  . 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 Severe mitral regurgitation 04/22/2013  . Stroke (HCC)   . SVT (supraventricular tachycardia) (HCC) 03/16/2013  . Tricuspid regurgitation     Past Surgical History:  Procedure Laterality Date  . APPENDECTOMY  [id number]  . CARDIAC CATHETERIZATION    . CARDIAC VALVE REPLACEMENT    . CESAREAN SECTION  1983  . CESAREAN SECTION WITH BILATERAL TUBAL LIGATION  1999  . FEMORAL HERNIA REPAIR Right 05/23/2013   Procedure: HERNIA REPAIR FEMORAL;  Surgeon: Purcell Nails, MD;  Location: [facility] OR;  Service: Open Heart Surgery;  Laterality: Right;  . INTRAOPERATIVE TRANSESOPHAGEAL ECHOCARDIOGRAM N/A 05/23/2013   Procedure: INTRAOPERATIVE TRANSESOPHAGEAL ECHOCARDIOGRAM;  Surgeon: Purcell Nails, MD;  Location: [facility] OR;  Service: Open Heart Surgery;  Laterality: N/A;  . LAPAROSCOPIC CHOLECYSTECTOMY  2003  . LEFT AND RIGHT HEART CATHETERIZATION WITH CORONARY ANGIOGRAM N/A 03/22/2013   Procedure: LEFT AND RIGHT HEART CATHETERIZATION WITH CORONARY ANGIOGRAM;  Surgeon: Kathleene Hazel, MD;  Location: [facility] CATH LAB;  Service: Cardiovascular;  Laterality: N/A;  . MINIMALLY INVASIVE TRICUSPID VALVE REPAIR Right 05/23/2013   Procedure: MINIMALLY INVASIVE TRICUSPID VALVE REPAIR;  Surgeon: Purcell Nails, MD;  Location: [facility] OR;  Service: Open Heart Surgery;  Laterality: Right;  . MITRAL VALVE REPLACEMENT N/A 05/23/2013   Procedure: MITRAL VALVE (MV) REPLACEMENT;  Surgeon: Purcell Nails, MD;  Location: [facility] OR;  Service: Open Heart Surgery;  Laterality: N/A;  . MULTIPLE EXTRACTIONS WITH ALVEOLOPLASTY N/A 04/04/2013   Procedure: Extraction of tooth #'s 1,8,9,13,14,15,23,24,25,26 with alveoloplasty and gross debridement of remaining teeth;  Surgeon: Charlynne Pander, DDS;  Location: [facility] OR;  Service: Oral Surgery;  Laterality: N/A;  . TEE WITHOUT CARDIOVERSION N/A 03/18/2013    Procedure: TRANSESOPHAGEAL ECHOCARDIOGRAM (TEE);  Surgeon: Laurey Morale, MD;  Location: [facility] ENDOSCOPY;  Service: Cardiovascular;  Laterality: N/A;  . TEE WITHOUT CARDIOVERSION N/A 06/17/2013   Procedure: TRANSESOPHAGEAL ECHOCARDIOGRAM (TEE);  Surgeon: Lewayne Bunting, MD;  Location: [facility] ENDOSCOPY;  Service: Cardiovascular;  Laterality: N/A;  . TUBAL LIGATION  1999    Medications: Prior to Admission medications   Medication Sig Start Date End Date Taking? Authorizing Provider  aspirin EC 81 MG tablet Take 1 tablet (81 mg total) by mouth daily. 08/26/13  Yes Laurey Morale, MD  Hyprom-Naphaz-Polysorb-Zn Sulf (CLEAR EYES COMPLETE OP) Place 2 drops into both eyes daily as needed (red eyes).   Yes [provider]  warfarin (COUMADIN) 5 MG tablet Take as directed by coumadin clinic Patient taking differently: mon wed thurs sat=7.5 mg tues fri sun=5 mg Take as directed by coumadin clinic 01/02/18  Yes Laurey Morale, MD  [facility] HAZEL EX Apply 1 application topically at bedtime as needed (knee pain, bruise on arm).    Yes [provider]    Allergies:   Allergies  Allergen Reactions  . Oxycodone Itching    Social History:  reports that  she quit smoking about 5 years ago. Her smoking use included cigarettes. She has a 18.00 pack-year smoking history. She has never used smokeless tobacco. She reports that she drinks alcohol. She reports that she does not use drugs.  Family History: Family History  Problem Relation Age of Onset  . Cancer Mother   . Stroke Father   . Sarcoidosis Sister   . Heart attack Neg Hx     Physical Exam: Blood pressure 115/81, pulse (!) 120, temperature 98.1 F (36.7 C), temperature source Oral, resp. rate 16, weight 58.5 kg, last menstrual period 03/22/2013, SpO2 100 %. General: Alert, awake, oriented x3, in no acute distress, dry mucous membranes. Eyes: pink conjunctiva,anicteric sclera, pupils equal and reactive to light and  accomodation, HEENT: normocephalic, atraumatic, oropharynx clear Neck: supple, no masses or lymphadenopathy, no goiter, no bruits, no JVD CVS: Regular rate and rhythm, without murmurs, rubs or gallops. No lower extremity edema Resp : Clear to auscultation bilaterally, no wheezing, rales or rhonchi. GI : Soft, no significant tenderness, nondistended, positive bowel sounds, no masses. Musculoskeletal: No clubbing or cyanosis, positive pedal pulses. No contracture. ROM intact  Neuro: Grossly intact, no focal neurological deficits, strength 5/5 upper and lower extremities bilaterally Psych: alert and oriented x 3, normal mood and affect Skin: no rashes or lesions, warm and dry   LABS on Admission: I have personally reviewed all the labs and imagings below    Basic Metabolic Panel: Recent Labs  Lab 05/23/18 1141  NA 138  K 4.5  CL 106  CO2 19*  GLUCOSE 97  BUN 16  CREATININE 2.47*  CALCIUM 9.1   Liver Function Tests: Recent Labs  Lab 05/23/18 1141  AST 33  ALT 17  ALKPHOS 129*  BILITOT 0.5  PROT 7.3  ALBUMIN 3.5   Recent Labs  Lab 05/23/18 1141  LIPASE 27   No results for input(s): AMMONIA in the last 168 hours. CBC: Recent Labs  Lab 05/23/18 1141  WBC 8.7  NEUTROABS 6.6  HGB 13.5  HCT 38.2  MCV 103.2*  PLT 417*   Cardiac Enzymes: No results for input(s): CKTOTAL, CKMB, CKMBINDEX, TROPONINI in the last 168 hours. BNP: Invalid input(s): POCBNP CBG: No results for input(s): GLUCAP in the last 168 hours.  Radiological Exams on Admission:  No results found.    EKG: Independently reviewed. 123, sinus tachycardia   Assessment/Plan Principal Problem:   Acute kidney injury (HCC): Prerenal likely due to severe dehydration due to intractable nausea, vomiting, diarrhea, poor little oral intake -Baseline creatinine 0.8, creatinine on admission 2.47 -Placed on gentle hydration with IV fluids, clear liquid diet  Active Problems: Nausea, vomiting, diarrhea  secondary to gastroenteritis, rule out colitis -Symptoms started after eating chicken dish with plantains -Given symptoms for a week and progressively worsening, obtain CT abdomen without contrast -For now placed on IV Rocephin and Flagyl, follow GI pathogen panel -No recent antibiotic use to suggest C. difficile  Heart block with SVT (supraventricular tachycardia) (HCC) -placed on telemetry, has been followed by Dr. Sharrell Ku outpatient, last office visit on 04/24/2018, notes reviewed -Not on AV nodal blocking drugs.  Patient has been recommended pacemaker insertion by her cardiologist     H/O heart valve replacement with mechanical valve on Coumadin -Patient has a history of MVR and TV repair, followed by cardiology, Dr. Sharrell Ku -INR currently therapeutic per, will continue Coumadin per pharmacy     (HFpEF) heart failure with preserved ejection fraction (HCC) -2D echo in 5/19 showed  normal LVEF, EF of 55 to 60% with appropriate function of her mitral valve replacement and tricuspid valve repair -Continue gentle IV fluid hydration, avoid fluid overload   DVT prophylaxis: On Coumadin  CODE STATUS: Full CODE STATUS  Consults called: None Family Communication: Admission, patients condition and plan of care including tests being ordered have been discussed with the patient who indicates understanding and agree with the plan and Code Status  Admission status:   Disposition plan: Further plan will depend as patient's clinical course evolves and further radiologic and laboratory data become available.    At the time of admission, it appears that the appropriate admission status for this patient is observation status. This is judged to be reasonable and necessary in order to provide the required intensity of service to ensure the patient's safety given the presenting symptoms intractable nausea, vomiting, diarrhea with acute kidney injury, physical exam findings of dehydration, and  initial radiographic and laboratory data in the context of their chronic comorbidities.  The medical decision making on this patient was of high complexity and the patient is at high risk for clinical deterioration, therefore this is a level 3 visit.    Time Spent on Admission:     [place] Jon M.D. [facility] 05/23/2018, 1:31 PM Pager: [phone]  If 7PM-7AM, please contact night-coverage [email or web address] Password [email or web address]

## 2018-05-23 NOTE — Progress Notes (Signed)
ANTICOAGULATION CONSULT NOTE - Initial Consult  Pharmacy Consult for Coumadin Indication: mechanical valve  Allergies  Allergen Reactions  . Oxycodone Itching    Patient Measurements: Weight: 128 lb 15.5 oz (58.5 kg)  Vital Signs: Temp: 98.1 F (36.7 C) (08/28 1117) Temp Source: Oral (08/28 1117) BP: 115/81 (08/28 1331) Pulse Rate: 101 (08/28 1331)  Labs: Recent Labs    05/23/18 1141  HGB 13.5  HCT 38.2  PLT 417*  LABPROT 34.4*  INR 3.44  CREATININE 2.47*    Estimated Creatinine Clearance: 20.4 mL/min (A) (by C-G formula based on SCr of 2.47 mg/dL (H)).   Medical History: Past Medical History:  Diagnosis Date  . Acute diastolic heart failure ([place]) 03/15/2013  . Anemia   . Carotid stenosis    Carotid US 2/17: bilateral ICA 1-39% >> FU prn  . Childhood asthma   . Complete heart block ([place]) 05/28/2013   Post-op  . Epigastric hernia 200's  . GERD (gastroesophageal reflux disease)   . Heart murmur   . History of blood transfusion    "14 w/1st pregnancy; 2 w/last C-section" (03/15/2013)  . Hx of echocardiogram    a. Echo 4/16:  Mild focal basal septal hypertrophy, EF 60-65%, no RWMA, Mechanical MVR ok with mild central regurgitation and no perivalvular leak, small mobile density attached to valvular ring (1.5x1 cm) - post surgical changes vs SBE, mild LAE;   b. Echo 2/17:  Mild post wall LVH, EF 55-60%, no RWMA, mechanical MVR ok (mean 5 mmHg), normal RVSF  . Hypertension    no pcp   will go to [facility]  . 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 S/P mitral valve replacement with metallic valve 05/24/9370   33mm Sorin Carbomedics mechanical prosthesis via right mini thoracotomy approach  . 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   Complex valvuloplasty including Cor-matrix ECM patch augmentation of anterior and lateral leaflets with 5mm Edwards mc3 ring annuloplasty via right mini-thoracotomy approach  . Severe mitral regurgitation 04/22/2013  . Stroke ([place])    . SVT (supraventricular tachycardia) ([place]) 03/16/2013  . Tricuspid regurgitation     Medications:  Coumadin 7.5mg  daily except 5mg  on Tues/Fri/Sun- last dose 8/27 @ 6pm  Assessment: 57 yo F on chronic Coumadin for mechanical heart valve.  INR therapeutic on admission at upper end of goal range (3.44).  She was admitted with nausea & vomiting x 5 days.  She is started on IV Rocephin + Flagyl and clear, liquid diet which can further increase INR.  CBC WNL on admission & not acute bleeding noted.   Goal of Therapy:  INR 2.5-3.5 (per outpatient coag clinic notes)   Plan:  Coumadin 5mg  po x1 dose today at 6pm (slightly lower than home dose to avoid supra-therapeutic INR) Daily INR  Emarie Paul, Lavonia Drafts 05/23/2018,1:45 PM

## 2018-05-23 NOTE — ED Notes (Signed)
Bed: TD32 Expected date:  Expected time:  Means of arrival:  Comments: EMS/anorexia/hypotension

## 2018-05-23 NOTE — ED Provider Notes (Signed)
[facility] DEPT Provider Note   CSN: [id number] Arrival date & time: 05/23/18  1056     History   Chief Complaint Chief Complaint  Patient presents with  . Emesis  . Diarrhea    HPI Abrea THALYA FOUCHE is a 57 y.o. female.  HPI Patient presents with nausea vomiting and diarrhea for the last around 5 days.  Has had decreased oral intake.  States she is hungry but has not she eats anything it comes right back up.  Feels dizzy.  States she feels that the room is spinning around and also feels if she could pass out.  No headache.  No confusion.  States she has been urinating less.  She is on Coumadin for a prosthetic heart valve.  No blood in the stool or emesis.  She is not on any diuretics. Past Medical History:  Diagnosis Date  . Acute diastolic heart failure ([place]) 03/15/2013  . Anemia   . Carotid stenosis    Carotid US 2/17: bilateral ICA 1-39% >> FU prn  . Childhood asthma   . Complete heart block ([place]) 05/28/2013   Post-op  . Epigastric hernia 200's  . GERD (gastroesophageal reflux disease)   . Heart murmur   . History of blood transfusion    "14 w/1st pregnancy; 2 w/last C-section" (03/15/2013)  . Hx of echocardiogram    a. Echo 4/16:  Mild focal basal septal hypertrophy, EF 60-65%, no RWMA, Mechanical MVR ok with mild central regurgitation and no perivalvular leak, small mobile density attached to valvular ring (1.5x1 cm) - post surgical changes vs SBE, mild LAE;   b. Echo 2/17:  Mild post wall LVH, EF 55-60%, no RWMA, mechanical MVR ok (mean 5 mmHg), normal RVSF  . Hypertension    no pcp   will go to [facility]  . 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 S/P mitral valve replacement with metallic valve 2/68/3419   50mm Sorin Carbomedics mechanical prosthesis via right mini thoracotomy approach  . 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   Complex valvuloplasty including Cor-matrix ECM patch augmentation of anterior and lateral leaflets  with 47mm Edwards mc3 ring annuloplasty via right mini-thoracotomy approach  . Severe mitral regurgitation 04/22/2013  . Stroke ([place])   . SVT (supraventricular tachycardia) ([place]) 03/16/2013  . Tricuspid regurgitation     Patient Active Problem List   Diagnosis Date Noted  . Hyponatremia 04/18/2017  . Orthostatic hypotension 01/11/2017  . Chronic hyponatremia 01/11/2017  . Elevated liver enzymes 01/11/2017  . Atypical chest pain 06/05/2016  . Palpitations 03/20/2016  . Sinus tachycardia 02/01/2016  . AKI (acute kidney injury) ([place]) 01/10/2016  . Subtherapeutic international normalized ratio (INR) 01/10/2016  . TIA (transient ischemic attack) 09/13/2015  . History of rheumatic heart disease 09/13/2015  . CVA (cerebral vascular accident) ([place]) 09/12/2015  . SOB (shortness of breath) 02/24/2014  . First degree heart block 06/17/2013  . Weakness 06/16/2013  . (HFpEF) heart failure with preserved ejection fraction ([place]) 06/16/2013  . Anemia 06/16/2013  . History of bacterial endocarditis 06/16/2013  . Heart valve replaced by other means 06/10/2013  . Long term (current) use of anticoagulants 06/10/2013  . History of complete heart block 05/28/2013  . H/O heart valve replacement with mechanical valve 05/23/2013  . S/P minimally-invasive tricuspid valve repair 05/23/2013  . Femoral hernia 05/23/2013  . Mitral valve insufficiency 04/22/2013  . Mitral valve disorders(424.0) 04/22/2013  . Tricuspid regurgitation 04/22/2013  . 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  SVT (supraventricular tachycardia) ([place]) 03/16/2013    Past Surgical History:  Procedure Laterality Date  . APPENDECTOMY  [id number]  . CARDIAC CATHETERIZATION    . CARDIAC VALVE REPLACEMENT    . CESAREAN SECTION  1983  . CESAREAN SECTION WITH BILATERAL TUBAL LIGATION  1999  . FEMORAL HERNIA REPAIR Right 05/23/2013   Procedure: HERNIA REPAIR FEMORAL;  Surgeon: Rexene Alberts, MD;  Location: [place];  Service:  Open Heart Surgery;  Laterality: Right;  . INTRAOPERATIVE TRANSESOPHAGEAL ECHOCARDIOGRAM N/A 05/23/2013   Procedure: INTRAOPERATIVE TRANSESOPHAGEAL ECHOCARDIOGRAM;  Surgeon: Rexene Alberts, MD;  Location: [place];  Service: Open Heart Surgery;  Laterality: N/A;  . LAPAROSCOPIC CHOLECYSTECTOMY  2003  . LEFT AND RIGHT HEART CATHETERIZATION WITH CORONARY ANGIOGRAM N/A 03/22/2013   Procedure: LEFT AND RIGHT HEART CATHETERIZATION WITH CORONARY ANGIOGRAM;  Surgeon: Burnell Blanks, MD;  [facility];  Service: Cardiovascular;  Laterality: N/A;  . MINIMALLY INVASIVE TRICUSPID VALVE REPAIR Right 05/23/2013   Procedure: MINIMALLY INVASIVE TRICUSPID VALVE REPAIR;  Surgeon: Rexene Alberts, MD;  Location: [place];  Service: Open Heart Surgery;  Laterality: Right;  . MITRAL VALVE REPLACEMENT N/A 05/23/2013   Procedure: MITRAL VALVE (MV) REPLACEMENT;  Surgeon: Rexene Alberts, MD;  Location: [place];  Service: Open Heart Surgery;  Laterality: N/A;  . MULTIPLE EXTRACTIONS WITH ALVEOLOPLASTY N/A 04/04/2013   Procedure: Extraction of tooth #'s 1,8,9,13,14,15,23,24,25,26 with alveoloplasty and gross debridement of remaining teeth;  Surgeon: Lenn Cal, DDS;  Location: [place];  Service: Oral Surgery;  Laterality: N/A;  . TEE WITHOUT CARDIOVERSION N/A 03/18/2013   Procedure: TRANSESOPHAGEAL ECHOCARDIOGRAM (TEE);  Surgeon: Larey Dresser, MD;  Location: [place];  Service: Cardiovascular;  Laterality: N/A;  . TEE WITHOUT CARDIOVERSION N/A 06/17/2013   Procedure: TRANSESOPHAGEAL ECHOCARDIOGRAM (TEE);  Surgeon: Lelon Perla, MD;  Location: [facility] ENDOSCOPY;  Service: Cardiovascular;  Laterality: N/A;  . TUBAL LIGATION  1999     OB History   None      Home Medications    Prior to Admission medications   Medication Sig Start Date End Date Taking? Authorizing Provider  aspirin EC 81 MG tablet Take 1 tablet (81 mg total) by mouth daily. 08/26/13  Yes Larey Dresser, MD  Hyprom-Naphaz-Polysorb-Zn  Sulf (CLEAR EYES COMPLETE OP) Place 2 drops into both eyes daily as needed (red eyes).   Yes [provider]  warfarin (COUMADIN) 5 MG tablet Take as directed by coumadin clinic Patient taking differently: mon wed thurs sat=7.5 mg tues fri sun=5 mg Take as directed by coumadin clinic 01/02/18  Yes Larey Dresser, MD  [facility] HAZEL EX Apply 1 application topically at bedtime as needed (knee pain, bruise on arm).    Yes [provider]    Family History Family History  Problem Relation Age of Onset  . Cancer Mother   . Stroke Father   . Sarcoidosis Sister   . Heart attack Neg Hx     Social History Social History   Tobacco Use  . Smoking status: Former Smoker    Packs/day: 0.50    Years: 36.00    Pack years: 18.00    Types: Cigarettes    Last attempt to quit: 03/15/2013    Years since quitting: 5.1  . Smokeless tobacco: Never Used  Substance Use Topics  . Alcohol use: Yes    Comment: 01/11/2017  "glass of wine/month"  . Drug use: No     Allergies   Oxycodone   Review  of Systems Review of Systems  Constitutional: Positive for fatigue. Negative for fever.  HENT: Negative for congestion.   Respiratory: Negative for shortness of breath.   Cardiovascular: Negative for chest pain.  Gastrointestinal: Positive for abdominal pain, diarrhea, nausea and vomiting.  Endocrine: Negative for polyuria.  Genitourinary: Negative for flank pain and urgency.       Decreased urination  Musculoskeletal: Negative for back pain.  Skin: Negative for rash.  Neurological: Positive for dizziness and light-headedness. Negative for tremors.  Hematological: Negative for adenopathy.  Psychiatric/Behavioral: Negative for confusion.     Physical Exam Updated Vital Signs BP 101/61   Pulse (!) 120   Temp 98.1 F (36.7 C) (Oral)   Resp 13   Wt 58.5 kg   LMP 03/22/2013   SpO2 100%   BMI 23.59 kg/m   Physical Exam  HENT:  Head: Normocephalic.  Eyes: Pupils are equal,  round, and reactive to light.  Neck: Neck supple.  Cardiovascular:  Tachycardia  Pulmonary/Chest: Effort normal.  Abdominal: There is no tenderness.  Musculoskeletal: She exhibits no edema.  Neurological: She is alert.  Skin: Skin is warm. Capillary refill takes less than 2 seconds.  Psychiatric: She has a normal mood and affect.     ED Treatments / Results  Labs (all labs ordered are listed, but only abnormal results are displayed) Labs Reviewed  COMPREHENSIVE METABOLIC PANEL - Abnormal; Notable for the following components:      Result Value   CO2 19 (*)    Creatinine, Ser 2.47 (*)    Alkaline Phosphatase 129 (*)    GFR calc non Af Amer 21 (*)    GFR calc Af Amer 24 (*)    All other components within normal limits  CBC WITH DIFFERENTIAL/PLATELET - Abnormal; Notable for the following components:   RBC 3.70 (*)    MCV 103.2 (*)    MCH 36.5 (*)    Platelets 417 (*)    All other components within normal limits  PROTIME-INR - Abnormal; Notable for the following components:   Prothrombin Time 34.4 (*)    All other components within normal limits  LIPASE, BLOOD  URINALYSIS, ROUTINE W REFLEX MICROSCOPIC    EKG EKG Interpretation  Date/Time:  Wednesday May 23 2018 11:21:41 EDT Ventricular Rate:  123 PR Interval:    QRS Duration: 96 QT Interval:  327 QTC Calculation: 468 R Axis:   -25 Text Interpretation:  Sinus tachycardia Consider right atrial enlargement Borderline left axis deviation Confirmed by Davonna Belling [id number]) on 05/23/2018 11:36:13 AM   Radiology No results found.  Procedures Procedures (including critical care time)  Medications Ordered in ED Medications  sodium chloride 0.9 % bolus 1,000 mL (1,000 mLs Intravenous New Bag/Given 05/23/18 1144)  ondansetron (ZOFRAN) injection 4 mg (0 mg Intravenous Hold 05/23/18 1144)     Initial Impression / Assessment and Plan / ED Course  I have reviewed the triage vital signs and the nursing  notes.  Pertinent labs & imaging results that were available during my care of the patient were reviewed by me and considered in my medical decision making (see chart for details).    Patient presents with nausea vomiting diarrhea.  Tachycardia with mild hypotension.  Has acute kidney injury.  With increased creatinine I feels patient would benefit from admission the hospital.  Will admit to hospitalist.  Final Clinical Impressions(s) / ED Diagnoses   Final diagnoses:  Nausea vomiting and diarrhea  AKI (acute kidney injury) ([place])  ED Discharge Orders    None       Davonna Belling, MD 05/23/18 1244

## 2018-05-23 NOTE — ED Notes (Signed)
Pt to go to CT prior to being transported to room.

## 2018-05-23 NOTE — ED Notes (Signed)
Pts IV positional. This RN was going to attempt to gain additional access Pt requesting not to have another IV started at this time. Pt states "I really dont want to be stuck again. I will keep this other arm very still."

## 2018-05-23 NOTE — ED Notes (Signed)
Pt to CT next

## 2018-05-23 NOTE — ED Triage Notes (Signed)
Pt arrives via EMS from home. Pt reports N/V/D since Friday. Pt reports weakness increasing today.

## 2018-05-23 NOTE — ED Notes (Signed)
Patient transported to CT 

## 2018-05-24 ENCOUNTER — Encounter (HOSPITAL_COMMUNITY): Payer: Self-pay

## 2018-05-24 ENCOUNTER — Inpatient Hospital Stay (HOSPITAL_COMMUNITY): Payer: Self-pay

## 2018-05-24 DIAGNOSIS — E869 Volume depletion, unspecified: Secondary | ICD-10-CM | POA: Diagnosis present

## 2018-05-24 LAB — GASTROINTESTINAL PANEL BY PCR, STOOL (REPLACES STOOL CULTURE)
ADENOVIRUS F40/41: NOT DETECTED
Astrovirus: NOT DETECTED
CAMPYLOBACTER SPECIES: NOT DETECTED
CRYPTOSPORIDIUM: NOT DETECTED
Cyclospora cayetanensis: NOT DETECTED
ENTEROAGGREGATIVE E COLI (EAEC): NOT DETECTED
ENTEROPATHOGENIC E COLI (EPEC): NOT DETECTED
Entamoeba histolytica: NOT DETECTED
Enterotoxigenic E coli (ETEC): NOT DETECTED
GIARDIA LAMBLIA: NOT DETECTED
NOROVIRUS GI/GII: NOT DETECTED
PLESIMONAS SHIGELLOIDES: NOT DETECTED
Rotavirus A: NOT DETECTED
SALMONELLA SPECIES: NOT DETECTED
SHIGELLA/ENTEROINVASIVE E COLI (EIEC): NOT DETECTED
Sapovirus (I, II, IV, and V): NOT DETECTED
Shiga like toxin producing E coli (STEC): NOT DETECTED
Vibrio cholerae: NOT DETECTED
Vibrio species: NOT DETECTED
Yersinia enterocolitica: NOT DETECTED

## 2018-05-24 LAB — BASIC METABOLIC PANEL
ANION GAP: 6 (ref 5–15)
BUN: 15 mg/dL (ref 6–20)
CHLORIDE: 116 mmol/L — AB (ref 98–111)
CO2: 17 mmol/L — AB (ref 22–32)
Calcium: 8.7 mg/dL — ABNORMAL LOW (ref 8.9–10.3)
Creatinine, Ser: 1.37 mg/dL — ABNORMAL HIGH (ref 0.44–1.00)
GFR calc non Af Amer: 43 mL/min — ABNORMAL LOW (ref >60)
GFR, EST AFRICAN AMERICAN: 49 mL/min — AB (ref >60)
GLUCOSE: 96 mg/dL (ref 70–99)
POTASSIUM: 4.3 mmol/L (ref 3.5–5.1)
SODIUM: 139 mmol/L (ref 135–145)

## 2018-05-24 LAB — CBC
HEMATOCRIT: 33 % — AB (ref 36.0–46.0)
HEMOGLOBIN: 11.2 g/dL — AB (ref 12.0–15.0)
MCH: 35.1 pg — AB (ref 26.0–34.0)
MCHC: 33.9 g/dL (ref 30.0–36.0)
MCV: 103.4 fL — ABNORMAL HIGH (ref 78.0–100.0)
Platelets: 375 10*3/uL (ref 150–400)
RBC: 3.19 MIL/uL — ABNORMAL LOW (ref 3.87–5.11)
RDW: 14.4 % (ref 11.5–15.5)
WBC: 8.7 10*3/uL (ref 4.0–10.5)

## 2018-05-24 LAB — PROTIME-INR
INR: 4.08 — AB
Prothrombin Time: 39.3 seconds — ABNORMAL HIGH (ref 11.4–15.2)

## 2018-05-24 LAB — C DIFFICILE QUICK SCREEN W PCR REFLEX
C Diff antigen: NEGATIVE
C Diff interpretation: NOT DETECTED
C Diff toxin: NEGATIVE

## 2018-05-24 LAB — HIV ANTIBODY (ROUTINE TESTING W REFLEX): HIV SCREEN 4TH GENERATION: NONREACTIVE

## 2018-05-24 MED ORDER — AMOXICILLIN-POT CLAVULANATE 875-125 MG PO TABS
1.0000 | ORAL_TABLET | Freq: Two times a day (BID) | ORAL | Status: DC
  Administered 2018-05-24 – 2018-05-25 (×3): 1 via ORAL
  Filled 2018-05-24 (×3): qty 1

## 2018-05-24 MED ORDER — ADULT MULTIVITAMIN W/MINERALS CH
1.0000 | ORAL_TABLET | Freq: Every day | ORAL | Status: DC
  Administered 2018-05-24 – 2018-05-25 (×2): 1 via ORAL
  Filled 2018-05-24 (×2): qty 1

## 2018-05-24 MED ORDER — LACTATED RINGERS IV SOLN
INTRAVENOUS | Status: DC
  Administered 2018-05-24 – 2018-05-25 (×3): via INTRAVENOUS

## 2018-05-24 MED ORDER — BOOST / RESOURCE BREEZE PO LIQD CUSTOM
1.0000 | Freq: Two times a day (BID) | ORAL | Status: DC
  Administered 2018-05-24: 1 via ORAL

## 2018-05-24 MED ORDER — CLINDAMYCIN PHOSPHATE 600 MG/50ML IV SOLN
600.0000 mg | Freq: Three times a day (TID) | INTRAVENOUS | Status: DC
  Filled 2018-05-24: qty 50

## 2018-05-24 NOTE — Progress Notes (Signed)
Initial Nutrition Assessment  DOCUMENTATION CODES:   Not applicable  INTERVENTION:  - Will order Boost Breeze po TID, each supplement provides 250 kcal and 9 grams of protein - Will order daily multivitamin with minerals.  - Continue to encourage PO intakes.   NUTRITION DIAGNOSIS:   Inadequate oral intake related to acute illness, nausea, vomiting as evidenced by per patient/family report, meal completion < 50%.  GOAL:   Patient will meet greater than or equal to 90% of their needs  MONITOR:   PO intake, Supplement acceptance, Weight trends, Labs, I & O's  REASON FOR ASSESSMENT:   Malnutrition Screening Tool  ASSESSMENT:   57 year old female with history of CHF, carotid stenosis, rheumatic mitral stenosis with regurgitation, mechanical heart valve, GERD, CVA, SVT. She presented to ED with tractable nausea, vomiting, diarrhea for 6 days. Patient reported that her symptoms started on Thursday (8/22) when she ate chicken, which she cooked, and bought, along with plantains, from [employer]. Soon after eating the dish, she started having abdominal cramping with nausea, vomiting and diarrhea. Patient reported she has been vomiting at least 5-6 times/day with diarrhea every hour. When she tried to get out of the house to buy ginger ale she felt very dizzy, lightheaded, and almost passed out so she called EMS.  BMI indicates normal weight. Patient was able to eat ~25% of breakfast this AM (236 kcal, 8 grams of protein) which is more than she has eaten in the past 1 week. She reports that last solid food was chicken and plantains she ate for dinner last Thursday. Since that time she has had N/V/D and would experience vomiting with all solid foods, was able to keep down some sips of liquids. Patient reports improvement in symptoms since admission and that she has not had any episodes of vomiting since admission.   No wasting noted during NFPE. Per chart review, current weight of 128 lb and  weight on 6/28 was 133 lb. This indicates 5 lb weight loss (3.7% body weight) in the past 2 months. Patient feels that this weight loss all occurred within the past 1 week. Do not feel comfortable stating malnutrition at this time (as suspect some weight loss d/t dehydration/large fluid losses), although patient is at very high risk.   Medications reviewed. Labs reviewed; Cl: 116 mmol/L, creatinine: 1.37 mg/dL, Ca: 8.7 mg/dL, GFR: 43 mL/min.  IVF; LR @ 100 mL/hr.       NUTRITION - FOCUSED PHYSICAL EXAM:  Completed; no muscle or fat wasting noted at this time.   Diet Order:   Diet Order            DIET SOFT Room service appropriate? Yes; Fluid consistency: Thin  Diet effective now              EDUCATION NEEDS:   No education needs have been identified at this time  Skin:  Skin Assessment: Reviewed RN Assessment  Last BM:  8/29 x2  Height:   Ht Readings from Last 1 Encounters:  05/23/18 5\' 2"  (1.575 m)    Weight:   Wt Readings from Last 1 Encounters:  05/23/18 58.4 kg    Ideal Body Weight:  50 kg  BMI:  Body mass index is 23.55 kg/m.  Estimated Nutritional Needs:   Kcal:  1520-1750   Protein:  60-70 grams  Fluid:  >/= 2.5 L/day     Jarome Matin, MS, RD, LDN, [facility] Inpatient Clinical Dietitian Pager # [phone] After hours/weekend pager # [phone]

## 2018-05-24 NOTE — Progress Notes (Signed)
ANTICOAGULATION CONSULT NOTE  Pharmacy Consult for Coumadin Indication: mechanical valve  Allergies  Allergen Reactions  . Oxycodone Itching    Patient Measurements: Height: 5\' 2"  (157.5 cm) Weight: 128 lb 12 oz (58.4 kg) IBW/kg (Calculated) : 50.1  Vital Signs: Temp: 98.6 F (37 C) (08/29 0619) Temp Source: Oral (08/29 0619) BP: 119/78 (08/29 0619) Pulse Rate: 98 (08/29 0619)  Labs: Recent Labs    05/23/18 1141 05/24/18 0525  HGB 13.5 11.2*  HCT 38.2 33.0*  PLT 417* 375  LABPROT 34.4* 39.3*  INR 3.44 4.08*  CREATININE 2.47* 1.37*    Estimated Creatinine Clearance: 36.7 mL/min (A) (by C-G formula based on SCr of 1.37 mg/dL (H)).   Medical History: Past Medical History:  Diagnosis Date  . Acute diastolic heart failure ([place]) 03/15/2013  . Anemia   . Carotid stenosis    Carotid US 2/17: bilateral ICA 1-39% >> FU prn  . Childhood asthma   . Complete heart block ([place]) 05/28/2013   Post-op  . Epigastric hernia 200's  . GERD (gastroesophageal reflux disease)   . Heart murmur   . History of blood transfusion    "14 w/1st pregnancy; 2 w/last C-section" (03/15/2013)  . Hx of echocardiogram    a. Echo 4/16:  Mild focal basal septal hypertrophy, EF 60-65%, no RWMA, Mechanical MVR ok with mild central regurgitation and no perivalvular leak, small mobile density attached to valvular ring (1.5x1 cm) - post surgical changes vs SBE, mild LAE;   b. Echo 2/17:  Mild post wall LVH, EF 55-60%, no RWMA, mechanical MVR ok (mean 5 mmHg), normal RVSF  . Hypertension    no pcp   will go to [facility]  . 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 S/P mitral valve replacement with metallic valve 05/22/785   66mm Sorin Carbomedics mechanical prosthesis via right mini thoracotomy approach  . 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   Complex valvuloplasty including Cor-matrix ECM patch augmentation of anterior and lateral leaflets with 73mm Edwards mc3 ring annuloplasty via right  mini-thoracotomy approach  . Severe mitral regurgitation 04/22/2013  . Stroke ([place])   . SVT (supraventricular tachycardia) ([place]) 03/16/2013  . Tricuspid regurgitation     Medications PTA:  Coumadin 7.5mg  daily except 5mg  on Tues/Fri/Sun- last dose 8/27 @ 6pm  Assessment: 57 yo F on chronic Coumadin for mechanical heart valve.  INR therapeutic on admission at upper end of goal range (3.44).  She was admitted with nausea & vomiting x 5 days.  CBC WNL on admission & no acute bleeding noted.  05/24/2018:  INR elevated today (4.08) likely due to Flagyl + no oral intake  CBC: drop in Hg (13.5>11.2) but no acute bleeding noted.  No oral intake in ~1 week-->starting soft diet today  No major drug interactions noted-->IV Flagyl changed to oral Augmentin 8/29  Goal of Therapy:  INR 2.5-3.5 (per outpatient coag clinic notes)   Plan:  Hold Coumadin today Daily INR  Lafreda Casebeer, Lavonia Drafts 05/24/2018,8:55 AM

## 2018-05-24 NOTE — Progress Notes (Addendum)
[facility] PROGRESS NOTE  Mary [name] UDJ:[id number] DOB: [birth year] DOA: 05/23/2018 PCP: Patient, No Pcp Per   Narrative: 57 year old female rheumatic mitral stenosis mechanical mitral/tricuspid repair 02/2013 TIA 05/2016, high grade AV block noted artery stenosis less than 39%, HTN, SVT former smoker till 2014, moderate EtOH Left heart cath 2014 no CAD Chronic diastolic heart failure NYHA class II-III Last office visit Dr. and new inferior T wave inversions patient defer to ischemic testing as was without chest pain Office visit weight was and is now down to 58 kg Apparent food poisoning 6 days prior to admission to hospital 8/28-poor appetite-dizziness-nausea vomiting diarrhea Emergency room heart rate 120 BUN/creatinine 16/2.4 (baseline creatinine 0.8)  A & Plan   Enteritis?  >  Pneumonia-change antibiotics to Augmentin for intra-abdominal coverage although may be viral-C. difficile PCR pending stool more solid-follow-up x-ray chest as do not suspect pneumonia Coagulopathy secondary to probable drug interaction with Flagyl and Coumadin-pharmacy to dose AKI with contraction alkalosis [loss of bicarb from diarrhea]-continue saline as LR 100 cc-recheck labs in a.m. Diastolic heart failure NYHA class II-III-currently euvolemic/volume depleted at this time-on SVT with high-grade AV block--monitor on telemetry, check a.m. magnesium-outpatient follow-up needed HTN-no meds? TIA 05/2016-currently on aspirin for secondary prevention of the same-outpatient neurology follow-up Valve repair 02/2013-needs outpatient cardiology input Former smoker Moderate EtOH    Full code presumed, inpatient, await resolution, not ready for discharge   Eara Burruel, MD  [facility] Direct contact: [phone] --[address] [place]  --[email or web address]; password [email or web address]  7PM-7AM contact night coverage as above 05/24/2018, 8:42 AM  LOS: 0 days    Consultants:  None  Procedures:    Antimicrobials:  Ceftriaxone/Flagyl transitioned to Augmentin  Interval history/Subjective: Stool is solidifying slowly No fever No chills Overall is improved Asking for diet-feels bloated-no chest pain-tells me she has not had a cough whatsoever  Objective:  Vitals:  Vitals:   05/24/18 0005 05/24/18 0619  BP:  119/78  Pulse: (!) 102 98  Resp:  16  Temp:  98.6 F (37 C)  SpO2:  100%    Exam:  . Awake alert very pleasant smiling . Chest clear . Metallic click to heart sounds slightly tachycardic 115 range . Abdomen bloated . Neurologically intact . No rash no lower extremity edema    I have personally reviewed the following:   Labs:  BUN/creatinine 16/2.4 down to 15/1.3, chloride 160  Lactic acid 1.8-->1.3  WBC 8.7 hemoglobin 11.2 MCV 103 platelets 375  Viral GI pathogen panel is negative awaiting C. Difficile  INR is 4.0  Imaging studies:  n  Medical tests:  n   Test discussed with performing physician:  n  Decision to obtain old records:  n  Review and summation of old records:  n  Scheduled Meds: . amoxicillin-clavulanate  1 tablet Oral Q12H  . aspirin EC  81 mg Oral Daily  . ondansetron (ZOFRAN) IV  4 mg Intravenous Once  . Warfarin - Pharmacist Dosing Inpatient   Does not apply q1800   Continuous Infusions: . lactated ringers      Principal Problem:   Acute kidney injury ([place]) Active Problems:   SVT (supraventricular tachycardia) ([facility])   H/O heart valve replacement with mechanical valve   Long term (current) use of anticoagulants   (HFpEF) heart failure with preserved ejection fraction ([place])   Gastroenteritis   LOS: 0 days

## 2018-05-24 NOTE — Care Management Note (Signed)
Case Management Note  Patient Details  Name: Mary [name] MRN: [id number] Date of Birth: 05/26/1961  Subjective/Objective:  Admitted wAKI. From home. No health insurance,no pcp. Has transp. Provided w/pcp listing-chose [facility](she will call @ d/c for pcp appt on own),she goes to [facility]-able to get meds @ reasonable cost.Financial counselor following for medicaid. No further CM needs.                  Action/Plan:d/c home.   Expected Discharge Date:                  Expected Discharge Plan:  Home/Self Care  In-House Referral:     Discharge planning Services  CM Consult, [facility]  Post Acute Care Choice:    Choice offered to:     DME Arranged:    DME Agency:     HH Arranged:    HH Agency:     Status of Service:  In process, will continue to follow  If discussed at Long Length of Stay Meetings, dates discussed:    Additional Comments:  Dessa Phi, RN 05/24/2018, 1:36 PM

## 2018-05-24 NOTE — Progress Notes (Signed)
CRITICAL VALUE ALERT  Critical Value:  INR 4.8  Date & Time Notied:  05/24/18  Provider Notified: Verlon Au  Orders Received/Actions taken:

## 2018-05-25 DIAGNOSIS — N179 Acute kidney failure, unspecified: Principal | ICD-10-CM

## 2018-05-25 LAB — COMPREHENSIVE METABOLIC PANEL
ALBUMIN: 2.9 g/dL — AB (ref 3.5–5.0)
ALT: 12 U/L (ref 0–44)
ANION GAP: 6 (ref 5–15)
AST: 21 U/L (ref 15–41)
Alkaline Phosphatase: 91 U/L (ref 38–126)
BILIRUBIN TOTAL: 0.5 mg/dL (ref 0.3–1.2)
BUN: 9 mg/dL (ref 6–20)
CHLORIDE: 117 mmol/L — AB (ref 98–111)
CO2: 17 mmol/L — ABNORMAL LOW (ref 22–32)
Calcium: 8.6 mg/dL — ABNORMAL LOW (ref 8.9–10.3)
Creatinine, Ser: 0.78 mg/dL (ref 0.44–1.00)
GFR calc Af Amer: >60 mL/min (ref >60)
Glucose, Bld: 90 mg/dL (ref 70–99)
POTASSIUM: 4.2 mmol/L (ref 3.5–5.1)
Sodium: 140 mmol/L (ref 135–145)
TOTAL PROTEIN: 5.9 g/dL — AB (ref 6.5–8.1)

## 2018-05-25 LAB — CBC WITH DIFFERENTIAL/PLATELET
Basophils Absolute: 0 10*3/uL (ref 0.0–0.1)
Basophils Relative: 0 %
EOS ABS: 0.5 10*3/uL (ref 0.0–0.7)
Eosinophils Relative: 7 %
HCT: 31.6 % — ABNORMAL LOW (ref 36.0–46.0)
HEMOGLOBIN: 10.9 g/dL — AB (ref 12.0–15.0)
LYMPHS ABS: 1.1 10*3/uL (ref 0.7–4.0)
LYMPHS PCT: 15 %
MCH: 35.7 pg — AB (ref 26.0–34.0)
MCHC: 34.5 g/dL (ref 30.0–36.0)
MCV: 103.6 fL — AB (ref 78.0–100.0)
Monocytes Absolute: 0.4 10*3/uL (ref 0.1–1.0)
Monocytes Relative: 5 %
Neutro Abs: 5 10*3/uL (ref 1.7–7.7)
Neutrophils Relative %: 73 %
Platelets: 341 10*3/uL (ref 150–400)
RBC: 3.05 MIL/uL — AB (ref 3.87–5.11)
RDW: 14.5 % (ref 11.5–15.5)
WBC: 7 10*3/uL (ref 4.0–10.5)

## 2018-05-25 LAB — PROTIME-INR
INR: 2.64
PROTHROMBIN TIME: 28 s — AB (ref 11.4–15.2)

## 2018-05-25 LAB — MAGNESIUM: Magnesium: 1.4 mg/dL — ABNORMAL LOW (ref 1.7–2.4)

## 2018-05-25 MED ORDER — MAGNESIUM 400 MG PO TABS: 1.0000 | ORAL_TABLET | Freq: Two times a day (BID) | ORAL | 0 refills | Status: AC

## 2018-05-25 MED ORDER — MAGNESIUM SULFATE 2 GM/50ML IV SOLN
2.0000 g | Freq: Once | INTRAVENOUS | Status: AC
  Administered 2018-05-25: 2 g via INTRAVENOUS
  Filled 2018-05-25: qty 50

## 2018-05-25 MED ORDER — WARFARIN SODIUM 5 MG PO TABS
5.0000 mg | ORAL_TABLET | Freq: Once | ORAL | Status: AC
  Administered 2018-05-25: 5 mg via ORAL
  Filled 2018-05-25: qty 1

## 2018-05-25 NOTE — Progress Notes (Signed)
ANTICOAGULATION CONSULT NOTE  Pharmacy Consult for Coumadin Indication: mechanical valve  Allergies  Allergen Reactions  . Oxycodone Itching    Patient Measurements: Height: 5\' 2"  (157.5 cm) Weight: 128 lb 12 oz (58.4 kg) IBW/kg (Calculated) : 50.1  Vital Signs: Temp: 98.5 F (36.9 C) (08/30 0614) Temp Source: Oral (08/30 0614) BP: 114/82 (08/30 5366) Pulse Rate: 106 (08/30 0614)  Labs: Recent Labs    05/23/18 1141 05/24/18 0525 05/25/18 0531  HGB 13.5 11.2* 10.9*  HCT 38.2 33.0* 31.6*  PLT 417* 375 341  LABPROT 34.4* 39.3* 28.0*  INR 3.44 4.08* 2.64  CREATININE 2.47* 1.37* 0.78    Estimated Creatinine Clearance: 62.8 mL/min (by C-G formula based on SCr of 0.78 mg/dL).   Medical History: Past Medical History:  Diagnosis Date  . Acute diastolic heart failure ([place]) 03/15/2013  . Anemia   . Carotid stenosis    Carotid US 2/17: bilateral ICA 1-39% >> FU prn  . Childhood asthma   . Complete heart block ([place]) 05/28/2013   Post-op  . Epigastric hernia 200's  . GERD (gastroesophageal reflux disease)   . Heart murmur   . History of blood transfusion    "14 w/1st pregnancy; 2 w/last C-section" (03/15/2013)  . Hx of echocardiogram    a. Echo 4/16:  Mild focal basal septal hypertrophy, EF 60-65%, no RWMA, Mechanical MVR ok with mild central regurgitation and no perivalvular leak, small mobile density attached to valvular ring (1.5x1 cm) - post surgical changes vs SBE, mild LAE;   b. Echo 2/17:  Mild post wall LVH, EF 55-60%, no RWMA, mechanical MVR ok (mean 5 mmHg), normal RVSF  . Hypertension    no pcp   will go to [facility]  . 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 S/P mitral valve replacement with metallic valve 4/40/3474   65mm Sorin Carbomedics mechanical prosthesis via right mini thoracotomy approach  . 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 Severe mitral regurgitation 04/22/2013  . Stroke ([place])   . SVT (supraventricular tachycardia) ([place]) 03/16/2013  . Tricuspid regurgitation     Medications PTA:  Coumadin 7.5mg  daily except 5mg  on Tues/Fri/Sun- last dose 8/27 @ 6pm  Assessment: 57 yo F on chronic Coumadin for mechanical heart valve.  INR therapeutic on admission at upper end of goal range (3.44).  She was admitted with nausea & vomiting x 5 days.  CBC WNL on admission & no acute bleeding noted.  05/25/2018:  INR at goal today (2.64) after dose held yesterday, abx changed & diet resumed  CBC: drop in Hg (13.5>11.2>>10.9) but no acute bleeding noted.  Eating 50-75% soft diet today  No major drug interactions noted-->IV Flagyl changed to oral Augmentin 8/29  Goal of Therapy:  INR 2.5-3.5 (per outpatient coag clinic notes)   Plan:  Coumadin 5mg  today (home dose) Daily INR  Mary [name], Mary [name] 05/25/2018,7:13 AM

## 2018-05-25 NOTE — Discharge Summary (Signed)
Physician Discharge Summary  Mary [name] GQQ:[id number] DOB: [birth year] DOA: 05/23/2018  PCP: Patient, No Pcp Per  Admit date: 05/23/2018 Discharge date: 05/25/2018  Time spent: 35 minutes  Recommendations for Outpatient Follow-up:  1. Patient does need outpatient colonoscopy 2. Please recheck Chem-12 and magnesium 1 week and consider addition of sodium bicarb if still slightly low CO2 3. Would recommend recheck INR 4. Bentyl or Imodium if further abdominal upset can be used if has further issues 5. Trial of FODMAP foods could be recommended in the outpatient setting  Discharge Diagnoses:  Principal Problem:   Acute kidney injury ([place]) Active Problems:   SVT (supraventricular tachycardia) ([place])   H/O heart valve replacement with mechanical valve   Long term (current) use of anticoagulants   (HFpEF) heart failure with preserved ejection fraction (HCC)   Gastroenteritis   Volume depletion   Discharge Condition: improved  Diet recommendation: FODMAP  Filed Weights   05/23/18 1115 05/23/18 1439  Weight: 58.5 kg 58.4 kg    History of present illness:  57 year old female rheumatic mitral stenosis mechanical mitral/tricuspid repair 02/2013 TIA 05/2016, high grade AV block noted artery stenosis less than 39%, HTN, SVT former smoker till 2014, moderate EtOH Left heart cath 2014 no CAD Chronic diastolic heart failure NYHA class II-III Last office visit Dr. and new inferior T wave inversions patient defer to ischemic testing as was without chest pain Office visit weight was and is now down to 58 kg Apparent food poisoning 6 days prior to admission to hospital 8/28-poor appetite-dizziness-nausea vomiting diarrhea Emergency room heart rate 120 BUN/creatinine 16/2.4 (baseline creatinine 0.8)  Hospital Course:   Enteritis? vs IBS or even Crohn's??  Pathogen panel negative, PCR C. difficile negative-stool more bulky-get lactoferrin-tells me sister has Crohn's-holding antibiotics-looks  much better and resolved Coagulopathy elevated INR resolved  AKI resolving well, adding oral bicarb??-DC IV fluid  Diastolic heart failure NYHA class II-III-currently euvolemic/volume depleted at this time-on  SVT with high-grade AV block complicated by episodic V. tach which is chronic further  Hypomagnesemia-given 2 g of IV mag today-needs outpatient recheck  HTN-no meds?  TIA 05/2016-currently on aspirin for secondary prevention of the same-outpatient neurology follow-up  Valve repair 02/2013-needs outpatient cardiology input  Former smoker  Moderate EtOH  Consultations:  None  Discharge Exam: Vitals:   05/25/18 0614 05/25/18 1434  BP: 114/82 136/74  Pulse: (!) 106 98  Resp: 18 16  Temp: 98.5 F (36.9 C) 98.1 F (36.7 C)  SpO2: 100% 100%    General: awake alert No distress Cardiovascular: s1 s 2no m/r/g Respiratory: clear no added sound abd soft nt nd no rebound no gaurd  Discharge Instructions   Discharge Instructions    Diet - low sodium heart healthy   Complete by:  As directed    Discharge instructions   Complete by:  As directed    I think at some point you should be tested for-ulcerative colitis or Crohn's colitis because of your recurrent bouts of diarrhea dating back to last year-in addition your sister does have Crohn's disease which makes the genetic likelihood of you having a bowel disorder a little bit higher There are medications that you can take such as Bentyl and other medications for spasms however I think this should be guided primarily by a gastroenterologist and I would encourage you to speak to your primary physician on Monday and get some labs-I have taken the liberty of getting 1 of my colleagues in gastroenterology involved and he will actually  call you with a day and time to follow-up with him in the office Make sure you get labs at primary physician's office   Increase activity slowly   Complete by:  As directed      Allergies as of  05/25/2018      Reactions   Oxycodone Itching      Medication List    TAKE these medications   aspirin EC 81 MG tablet Take 1 tablet (81 mg total) by mouth daily.   CLEAR EYES COMPLETE OP Place 2 drops into both eyes daily as needed (red eyes).   Magnesium 400 MG Tabs Take 1 tablet by mouth 2 (two) times daily for 8 days.   warfarin 5 MG tablet Commonly known as:  COUMADIN Take as directed. If you are unsure how to take this medication, talk to your nurse or doctor. Original instructions:  Take as directed by coumadin clinic What changed:  additional instructions   WITCH HAZEL EX Apply 1 application topically at bedtime as needed (knee pain, bruise on arm).      Allergies  Allergen Reactions  . Oxycodone Itching   Follow-up [facility].   Why:  OR this can become your primary doctor without insurance.  Once you make an appointment at 1 of these 2 clinics, please schedule a FINANCIAL COUNSELING appointment and use this location for your PHARMACY needs. Contact information: [place] [id number] [place]. Schedule an appointment as soon as possible for a visit.   Contact information: [facility] [id number] [phone]           The results of significant diagnostics from this hospitalization (including imaging, microbiology, ancillary and laboratory) are listed below for reference.    Significant Diagnostic Studies: Ct Abdomen Pelvis Wo Contrast  Result Date: 05/23/2018 CLINICAL DATA:  Five day history of intractable vomiting and diarrhea. Acute kidney injury with creatinine 2.47 which precludes IV contrast. EXAM: CT ABDOMEN AND PELVIS WITHOUT CONTRAST TECHNIQUE: Multidetector CT imaging of the abdomen and pelvis was performed following the standard protocol without IV contrast.  COMPARISON:  01/10/2016, 05/21/2013 and earlier. FINDINGS: Lower chest: Scattered areas of hyperlucency in the visualized lung bases. Minimal scarring in the lingula. Visualized lung bases otherwise clear. Prior mitral valve replacement and tricuspid valve repair. Stable normal heart size. Hepatobiliary: Normal unenhanced appearance of the liver. Surgically absent gallbladder. No biliary ductal dilation. Pancreas: Normal unenhanced appearance. Spleen: Normal unenhanced appearance. Adrenals/Urinary Tract: Normal appearing adrenal glands. No evidence of urinary tract calculi or obstruction on either side. Within the limits of the unenhanced technique, no focal parenchymal abnormality involving either kidney. Urinary bladder nearly completely decompressed and normal in appearance. Stomach/Bowel: Stomach normal in appearance for the degree of distention. Normal-appearing fluid-filled small bowel. Entire colon relatively decompressed containing liquid stool. No small bowel or colonic wall thickening to suggest enteritis or colitis. Normal appearing decompressed appendix in the RIGHT mid pelvis. Vascular/Lymphatic: No visible atherosclerosis. No pathologic lymphadenopathy. Reproductive: Normal-appearing uterus and ovaries without evidence of adnexal mass. Other: VENTRAL abdominal wall hernia in the epigastric region containing fat, unchanged since the prior examinations. The fat within the hernia does not appear indurated. Musculoskeletal: Regional skeleton unremarkable without acute or significant osseous abnormality. IMPRESSION: 1. No acute abnormalities involving the abdomen or pelvis. 2. Small midline VENTRAL abdominal wall hernia containing fat in  the epigastric region, unchanged when compared to prior CTs dating back to 2006. 3. Scattered areas of hyperlucency involving the visualized lung bases, indicating localized air trapping which may be due to asthma and/or bronchitis. Electronically Signed   By: Evangeline Dakin M.D.   On: 05/23/2018 14:44   Dg Chest 2 View  Result Date: 05/24/2018 CLINICAL DATA:  Sob, recent bronchitis, hx "valve replacement surgery" previously. EXAM: CHEST - 2 VIEW COMPARISON:  Chest x-ray dated 12/18/2017. FINDINGS: Heart size and mediastinal contours are within normal limits. Cardiac valvular hardware appears stable in position. Lungs are clear. No pleural effusion or pneumothorax seen. No acute or suspicious osseous finding. IMPRESSION: No active cardiopulmonary disease. No evidence of pneumonia or pulmonary edema. Electronically Signed   By: Franki Cabot M.D.   On: 05/24/2018 12:47    Microbiology: Recent Results (from the past 240 hour(s))  Gastrointestinal Panel by PCR , Stool     Status: None   Collection Time: 05/23/18  4:00 PM  Result Value Ref Range Status   Campylobacter species NOT DETECTED NOT DETECTED Final   Plesimonas shigelloides NOT DETECTED NOT DETECTED Final   Salmonella species NOT DETECTED NOT DETECTED Final   Yersinia enterocolitica NOT DETECTED NOT DETECTED Final   Vibrio species NOT DETECTED NOT DETECTED Final   Vibrio cholerae NOT DETECTED NOT DETECTED Final   Enteroaggregative E coli (EAEC) NOT DETECTED NOT DETECTED Final   Enteropathogenic E coli (EPEC) NOT DETECTED NOT DETECTED Final   Enterotoxigenic E coli (ETEC) NOT DETECTED NOT DETECTED Final   Shiga like toxin producing E coli (STEC) NOT DETECTED NOT DETECTED Final   Shigella/Enteroinvasive E coli (EIEC) NOT DETECTED NOT DETECTED Final   Cryptosporidium NOT DETECTED NOT DETECTED Final   Cyclospora cayetanensis NOT DETECTED NOT DETECTED Final   Entamoeba histolytica NOT DETECTED NOT DETECTED Final   Giardia lamblia NOT DETECTED NOT DETECTED Final   Adenovirus F40/41 NOT DETECTED NOT DETECTED Final   Astrovirus NOT DETECTED NOT DETECTED Final   Norovirus GI/GII NOT DETECTED NOT DETECTED Final   Rotavirus A NOT DETECTED NOT DETECTED Final   Sapovirus (I, II, IV, and V) NOT DETECTED  NOT DETECTED Final    Comment: Performed at [facility], [address] [place], [place] [address]  C difficile quick scan w PCR reflex     Status: None   Collection Time: 05/24/18 10:58 AM  Result Value Ref Range Status   C Diff antigen NEGATIVE NEGATIVE Final   C Diff toxin NEGATIVE NEGATIVE Final   C Diff interpretation No C. difficile detected.  Final    Comment: Performed at [facility], [place] [address] [place], [place] [address]     Labs: Basic Metabolic Panel: Recent Labs  Lab 05/23/18 1141 05/24/18 0525 05/25/18 0531  NA 138 139 140  K 4.5 4.3 4.2  CL 106 116* 117*  CO2 19* 17* 17*  GLUCOSE 97 96 90  BUN 16 15 9   CREATININE 2.47* 1.37* 0.78  CALCIUM 9.1 8.7* 8.6*  MG  --   --  1.4*   Liver Function Tests: Recent Labs  Lab 05/23/18 1141 05/25/18 0531  AST 33 21  ALT 17 12  ALKPHOS 129* 91  BILITOT 0.5 0.5  PROT 7.3 5.9*  ALBUMIN 3.5 2.9*   Recent Labs  Lab 05/23/18 1141  LIPASE 27   No results for input(s): AMMONIA in the last 168 hours. CBC: Recent Labs  Lab 05/23/18 1141 05/24/18 0525 05/25/18 0531  WBC 8.7 8.7 7.0  NEUTROABS 6.6  --  5.0  HGB 13.5 11.2* 10.9*  HCT 38.2 33.0* 31.6*  MCV 103.2* 103.4* 103.6*  PLT 417* 375 341   Cardiac Enzymes: No results for input(s): CKTOTAL, CKMB, CKMBINDEX, TROPONINI in the last 168 hours. BNP: BNP (last 3 results) Recent Labs    11/29/17 1118  BNP 98.3    ProBNP (last 3 results) No results for input(s): PROBNP in the last 8760 hours.  CBG: No results for input(s): GLUCAP in the last 168 hours.     Signed:  Nita Sells MD   [facility] 05/25/2018, 4:40 PM

## 2018-05-25 NOTE — Progress Notes (Signed)
Patient had a 12 beat run of vtach. Pt asymptomatic.  VSS. NP on call made aware

## 2018-05-25 NOTE — Progress Notes (Signed)
[facility] PROGRESS NOTE  Mary [name] MVH:[id number] DOB: 1961-01-15 DOA: 05/23/2018 PCP: Patient, No Pcp Per   Narrative: 57 year old female rheumatic mitral stenosis mechanical mitral/tricuspid repair 02/2013 TIA 05/2016, high grade AV block noted artery stenosis less than 39%, HTN, SVT former smoker till 2014, moderate EtOH Left heart cath 2014 no CAD Chronic diastolic heart failure NYHA class II-III Last office visit Dr. and new inferior T wave inversions patient defer to ischemic testing as was without chest pain Office visit weight was and is now down to 58 kg Apparent food poisoning 6 days prior to admission to hospital 8/28-poor appetite-dizziness-nausea vomiting diarrhea Emergency room heart rate 120 BUN/creatinine 16/2.4 (baseline creatinine 0.8)  A & Plan   Enteritis? Vs IBS or even Crohn's??  Pathogen panel negative, PCR C. difficile negative-stool more bulky-get lactoferrin-tells me sister has Crohn's-holding antibiotics if lactoferrin negative start on Imodium and needs outpatient follow-up if stool is more solid with regular diet later on today Coagulopathy elevated INR resolved AKI resolving well, adding oral bicarb-DC IV fluid Diastolic heart failure NYHA class II-III-currently euvolemic/volume depleted at this time-on SVT with high-grade AV block complicated by episodic V. tach which is chronic for her-magnesium however low replacing Hypomagnesemia-given 2 g of IV mag today HTN-no meds? TIA 05/2016-currently on aspirin for secondary prevention of the same-outpatient neurology follow-up Valve repair 02/2013-needs outpatient cardiology input Former smoker Moderate EtOH    Full code presumed, inpatient, await resolution, not ready for discharge   [place] Gan, MD  [facility] Direct contact: [phone] --Via amion app OR  --[email or web address]; password [email or web address]  7PM-7AM contact night coverage as above 05/25/2018, 10:51 AM  LOS: 1 day    Consultants:  None  Procedures:    Antimicrobials:  Ceftriaxone/Flagyl transitioned to Augmentin  Interval history/Subjective:  Sitting in chair, pudding-like consistency stools, no abdominal pain, no vomiting, no fever, no chills no difficulty tolerating diet and willing to graduate  Objective:  Vitals:  Vitals:   05/24/18 2217 05/25/18 0614  BP: 126/69 114/82  Pulse: 95 (!) 106  Resp: 16 18  Temp: 98.5 F (36.9 C) 98.5 F (36.9 C)  SpO2: 100% 100%    Exam:  Alert oriented no distress S1-S2 mechanical click V. tach 12 beats noted overnight on monitors otherwise S1-S2 no murmur Abdomen soft no rebound no guarding No lower extremity edema Neurologically intact  I have personally reviewed the following:   Labs:  Chloride 117 CO2 17 magnesium 1.4  WBC 7 hemoglobin down from 13 on admission to 10.9  Imaging studies:  n  Medical tests:  n   Test discussed with performing physician:  n  Decision to obtain old records:  n  Review and summation of old records:  n  Scheduled Meds: . amoxicillin-clavulanate  1 tablet Oral Q12H  . aspirin EC  81 mg Oral Daily  . feeding supplement  1 Container Oral BID BM  . multivitamin with minerals  1 tablet Oral Daily  . ondansetron (ZOFRAN) IV  4 mg Intravenous Once  . warfarin  5 mg Oral ONCE-1800  . Warfarin - Pharmacist Dosing Inpatient   Does not apply q1800   Continuous Infusions: . lactated ringers 100 mL/hr at 05/25/18 0612  . magnesium sulfate 1 - 4 g bolus IVPB 2 g (05/25/18 1034)    Principal Problem:   Acute kidney injury (HCC) Active Problems:   SVT (supraventricular tachycardia) (HCC)   H/O heart valve replacement with mechanical valve   Long term (current) use of anticoagulants   (  HFpEF) heart failure with preserved ejection fraction (HCC)   Gastroenteritis   Volume depletion   LOS: 1 day

## 2018-05-26 LAB — LACTOFERRIN, FECAL, QUALITATIVE: LACTOFERRIN, FECAL, QUAL: NEGATIVE

## 2018-05-31 ENCOUNTER — Other Ambulatory Visit: Payer: Self-pay

## 2018-05-31 ENCOUNTER — Encounter: Payer: Self-pay | Admitting: Family Medicine

## 2018-05-31 ENCOUNTER — Ambulatory Visit: Payer: Self-pay | Attending: Family Medicine | Admitting: Family Medicine

## 2018-05-31 ENCOUNTER — Ambulatory Visit: Payer: Self-pay | Admitting: Licensed Clinical Social Worker

## 2018-05-31 VITALS — BP 131/90 | HR 105 | Temp 98.8°F | Resp 19 | Ht 62.0 in | Wt 128.0 lb

## 2018-05-31 DIAGNOSIS — N898 Other specified noninflammatory disorders of vagina: Secondary | ICD-10-CM

## 2018-05-31 DIAGNOSIS — Z8673 Personal history of transient ischemic attack (TIA), and cerebral infarction without residual deficits: Secondary | ICD-10-CM | POA: Insufficient documentation

## 2018-05-31 DIAGNOSIS — Z8719 Personal history of other diseases of the digestive system: Secondary | ICD-10-CM | POA: Insufficient documentation

## 2018-05-31 DIAGNOSIS — Z7901 Long term (current) use of anticoagulants: Secondary | ICD-10-CM

## 2018-05-31 DIAGNOSIS — E871 Hypo-osmolality and hyponatremia: Secondary | ICD-10-CM

## 2018-05-31 DIAGNOSIS — F329 Major depressive disorder, single episode, unspecified: Secondary | ICD-10-CM

## 2018-05-31 DIAGNOSIS — F419 Anxiety disorder, unspecified: Principal | ICD-10-CM

## 2018-05-31 DIAGNOSIS — R197 Diarrhea, unspecified: Secondary | ICD-10-CM

## 2018-05-31 DIAGNOSIS — L298 Other pruritus: Secondary | ICD-10-CM | POA: Insufficient documentation

## 2018-05-31 DIAGNOSIS — F0631 Mood disorder due to known physiological condition with depressive features: Secondary | ICD-10-CM

## 2018-05-31 DIAGNOSIS — Z952 Presence of prosthetic heart valve: Secondary | ICD-10-CM

## 2018-05-31 DIAGNOSIS — K219 Gastro-esophageal reflux disease without esophagitis: Secondary | ICD-10-CM | POA: Insufficient documentation

## 2018-05-31 DIAGNOSIS — Z87891 Personal history of nicotine dependence: Secondary | ICD-10-CM | POA: Insufficient documentation

## 2018-05-31 DIAGNOSIS — I442 Atrioventricular block, complete: Secondary | ICD-10-CM | POA: Insufficient documentation

## 2018-05-31 DIAGNOSIS — F32A Depression, unspecified: Secondary | ICD-10-CM

## 2018-05-31 DIAGNOSIS — Z7982 Long term (current) use of aspirin: Secondary | ICD-10-CM | POA: Insufficient documentation

## 2018-05-31 DIAGNOSIS — Z9049 Acquired absence of other specified parts of digestive tract: Secondary | ICD-10-CM | POA: Insufficient documentation

## 2018-05-31 DIAGNOSIS — I5032 Chronic diastolic (congestive) heart failure: Secondary | ICD-10-CM

## 2018-05-31 DIAGNOSIS — D649 Anemia, unspecified: Secondary | ICD-10-CM | POA: Insufficient documentation

## 2018-05-31 DIAGNOSIS — E782 Mixed hyperlipidemia: Secondary | ICD-10-CM | POA: Insufficient documentation

## 2018-05-31 DIAGNOSIS — I110 Hypertensive heart disease with heart failure: Secondary | ICD-10-CM | POA: Insufficient documentation

## 2018-05-31 DIAGNOSIS — Z823 Family history of stroke: Secondary | ICD-10-CM | POA: Insufficient documentation

## 2018-05-31 DIAGNOSIS — E860 Dehydration: Secondary | ICD-10-CM | POA: Insufficient documentation

## 2018-05-31 MED ORDER — FLUCONAZOLE 150 MG PO TABS: 150.0000 mg | ORAL_TABLET | Freq: Once | ORAL | 0 refills | Status: AC

## 2018-05-31 NOTE — BH Specialist Note (Signed)
Integrated Behavioral Health Initial Visit  MRN: [id number] Name: Mary [name]  Number of [facility] Clinician visits:: Initial Visit Session Start time: 11:30am  Session End time: 11:45am Total time: 15 minutes  Type of Service: [place] Interpretor:No.    Warm Hand Off Completed.       SUBJECTIVE: Mary [name] is a 57 y.o. female accompanied by self Patient was referred by PCP for behavioral health resources. Patient reports the following symptoms/concerns: anxiety, depression, stress and worry Duration of problem: since heart condition began; Severity of problem: moderate  OBJECTIVE: Mood: Anxious and Depressed and Affect: Depressed and flat Risk of harm to self or others: No plan to harm self or others  LIFE CONTEXT: Family and Social:  Pt mentioned that her mother supports her financially and emotionally. Pt stated that the rest of her family lives out of state and her mom is her only form of support.  School/Work: Pt is unemployed and has had difficulty adjusting to unemployment due to heart condition. Pt stated that she worked majority of her life and she is having difficulty adjusting to not being able to work and relying on her mother. Self-Care: Pt stated that she does not do anything for self-care. Pt stated that she has no interest in walking. Life Changes: Pt has a heart condition and has been told that she needs a pacemaker.   GOALS ADDRESSED: Patient will: 1. Reduce symptoms of: anxiety, depression and stress 2. Increase knowledge and/or ability of: coping skills, healthy habits and stress reduction  3. Demonstrate ability to: Increase healthy adjustment to current life circumstances  INTERVENTIONS: Interventions utilized: Motivational Interviewing and Mindfulness or Relaxation Training  Standardized Assessments completed: GAD-7 and PHQ 9  ASSESSMENT: Patient currently experiencing increased  anxiety and depressive symptoms. Pt mentioned that she never has little to no interest in doing things and little to no appetite. Pt had a difficult time disclosing her feelings, but she stated that she has been having repetitive panic attacks. Pt had a very flat affect. It is apparent that patient is experiencing her symptoms due to her heart condition and the inability to work.    Patient may benefit from behavioral health resources, therapy, and a support group.  PLAN: 1. Behavioral recommendations: Mindfulness activities 2. Referral(s): [facility] (LME/Outside Clinic) 3. "From scale of 1-10, how likely are you to follow plan?": 5  Ruffin Pyo, MSW Intern 05/31/2018, 5:38pm

## 2018-05-31 NOTE — Progress Notes (Signed)
Subjective:    Patient ID: Mary [name], female    DOB: [birth year], 57 y.o.   MRN: [id number]  HPI       57 year old female who was seen to establish with a primary care physician and patient is status post hospital admission from 8/28 through 05/25/2018 secondary to acute kidney injury and dehydration from 6 days of nausea/vomiting and diarrhea prior to hospitalization.  Patient with medical problems including chronic diastolic heart failure, carotid stenosis, rheumatic mitral stenosis with regurgitation, status post mechanical heart valve placement, GERD, history of CVA, SVT with complete heart block for which pacemaker has been recommended by cardiology and patient also with anemia.  Patient is also on long-term anticoagulant with Coumadin and patient reports that she missed her appointment last week with Coumadin clinic and needs to reschedule.  Patient denies any unusual bruising or bleeding.      At today's visit, patient reports that she feels well.  Patient has had no additional nausea or vomiting.  Patient denies diarrhea but states that her stool is still slightly loose.  Patient denies any blood in the stool and no dark/black stool.  Patient reports that she does have some vaginal itching and would like medication as she suspects that she has a yeast infection.  Patient states that she was prescribed magnesium at hospital discharge but has not yet taken this medication.  Patient states that she is taking her Coumadin daily.  Patient also states that she takes an 81 mg aspirin daily but is otherwise on no other medicines.  Patient states that she does recall pacemaker placement being discussed but patient states that she does not feel that she needs a pacemaker and does not want to have one placed.  Patient denies any current headache or dizziness, no chest pain or palpitations, no shortness of breath or cough.  Patient denies any peripheral edema.       Past Medical History:  Diagnosis Date    . Acute diastolic heart failure ([place]) 03/15/2013  . Anemia   . Carotid stenosis    Carotid US 2/17: bilateral ICA 1-39% >> FU prn  . Childhood asthma   . Complete heart block ([place]) 05/28/2013   Post-op  . Epigastric hernia 200's  . GERD (gastroesophageal reflux disease)   . Heart murmur   . History of blood transfusion    "14 w/1st pregnancy; 2 w/last C-section" (03/15/2013)  . Hx of echocardiogram    a. Echo 4/16:  Mild focal basal septal hypertrophy, EF 60-65%, no RWMA, Mechanical MVR ok with mild central regurgitation and no perivalvular leak, small mobile density attached to valvular ring (1.5x1 cm) - post surgical changes vs SBE, mild LAE;   b. Echo 2/17:  Mild post wall LVH, EF 55-60%, no RWMA, mechanical MVR ok (mean 5 mmHg), normal RVSF  . Hypertension    no pcp   will go to [facility]  . 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 S/P mitral valve replacement with metallic valve 01/02/8118   76mm Sorin Carbomedics mechanical prosthesis via right mini thoracotomy approach  . 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   Complex valvuloplasty including Cor-matrix ECM patch augmentation of anterior and lateral leaflets with 77mm Edwards mc3 ring annuloplasty via right mini-thoracotomy approach  . Severe mitral regurgitation 04/22/2013  . Stroke ([place])   . SVT (supraventricular tachycardia) ([place]) 03/16/2013  . Tricuspid regurgitation    Past Surgical History:  Procedure Laterality Date  . APPENDECTOMY  [id number]  . CARDIAC  CATHETERIZATION    . CARDIAC VALVE REPLACEMENT    . CESAREAN SECTION  1983  . CESAREAN SECTION WITH BILATERAL TUBAL LIGATION  1999  . FEMORAL HERNIA REPAIR Right 05/23/2013   Procedure: HERNIA REPAIR FEMORAL;  Surgeon: Rexene Alberts, MD;  Location: [place];  Service: Open Heart Surgery;  Laterality: Right;  . INTRAOPERATIVE TRANSESOPHAGEAL ECHOCARDIOGRAM N/A 05/23/2013   Procedure: INTRAOPERATIVE TRANSESOPHAGEAL ECHOCARDIOGRAM;  Surgeon: Rexene Alberts, MD;   Location: [place];  Service: Open Heart Surgery;  Laterality: N/A;  . LAPAROSCOPIC CHOLECYSTECTOMY  2003  . LEFT AND RIGHT HEART CATHETERIZATION WITH CORONARY ANGIOGRAM N/A 03/22/2013   Procedure: LEFT AND RIGHT HEART CATHETERIZATION WITH CORONARY ANGIOGRAM;  Surgeon: Burnell Blanks, MD;  Location: [facility] CATH LAB;  Service: Cardiovascular;  Laterality: N/A;  . MINIMALLY INVASIVE TRICUSPID VALVE REPAIR Right 05/23/2013   Procedure: MINIMALLY INVASIVE TRICUSPID VALVE REPAIR;  Surgeon: Rexene Alberts, MD;  Location: [place];  Service: Open Heart Surgery;  Laterality: Right;  . MITRAL VALVE REPLACEMENT N/A 05/23/2013   Procedure: MITRAL VALVE (MV) REPLACEMENT;  Surgeon: Rexene Alberts, MD;  Location: [place];  Service: Open Heart Surgery;  Laterality: N/A;  . MULTIPLE EXTRACTIONS WITH ALVEOLOPLASTY N/A 04/04/2013   Procedure: Extraction of tooth #'s 1,8,9,13,14,15,23,24,25,26 with alveoloplasty and gross debridement of remaining teeth;  Surgeon: Lenn Cal, DDS;  Location: [place];  Service: Oral Surgery;  Laterality: N/A;  . TEE WITHOUT CARDIOVERSION N/A 03/18/2013   Procedure: TRANSESOPHAGEAL ECHOCARDIOGRAM (TEE);  Surgeon: Larey Dresser, MD;  Location: [place];  Service: Cardiovascular;  Laterality: N/A;  . TEE WITHOUT CARDIOVERSION N/A 06/17/2013   Procedure: TRANSESOPHAGEAL ECHOCARDIOGRAM (TEE);  Surgeon: Lelon Perla, MD;  Location: [facility] ENDOSCOPY;  Service: Cardiovascular;  Laterality: N/A;  . TUBAL LIGATION  1999   Family History  Problem Relation Age of Onset  . Cancer Mother   . Stroke Father   . Sarcoidosis Sister   . Heart attack Neg Hx    Social History   Tobacco Use  . Smoking status: Former Smoker    Packs/day: 0.50    Years: 36.00    Pack years: 18.00    Types: Cigarettes    Last attempt to quit: 03/15/2013    Years since quitting: 5.2  . Smokeless tobacco: Never Used  Substance Use Topics  . Alcohol use: Yes    Comment: 01/11/2017  "glass of wine/month"  .  Drug use: No    Review of Systems  Constitutional: Positive for fatigue (Mild). Negative for chills and fever.  Respiratory: Negative for cough and shortness of breath.   Cardiovascular: Negative for chest pain, palpitations and leg swelling.  Gastrointestinal: Negative for abdominal pain and blood in stool.  Endocrine: Negative for polydipsia and polyphagia.  Genitourinary: Negative for dysuria and frequency.  Musculoskeletal: Negative for gait problem and joint swelling.  Neurological: Negative for dizziness and headaches.       Objective:   Physical Exam BP 131/90 (BP Location: Left Arm, Patient Position: Sitting, Cuff Size: Normal)   Pulse (!) 105   Temp 98.8 F (37.1 C) (Oral)   Resp 19   Ht 5\' 2"  (1.575 m)   Wt 128 lb (58.1 kg)   LMP 03/22/2013   SpO2 98%   BMI 23.41 kg/m  Vital signs and nurse's note reviewed  General-well-nourished, well-developed older female in no acute distress Neck-supple, no lymphadenopathy, no thyromegaly Cardiovascular-regular rate and rhythm; mechanical heart valve noise Lungs-clear to auscultation bilaterally Abdomen- soft and nontender  Back-no CVA tenderness Extremities-no edema     Assessment & Plan:  1. Dehydration Patient status post hospitalization for acute dehydration from gastroenteritis.  Patient had a creatinine on admission of 2.47 which had decreased to 0.78 before discharge.  Patient will have CMP in follow-up of dehydration and long-term use of medications - Comprehensive metabolic panel  2. Diarrhea, unspecified type Patient reports diarrhea has resolved but she still has multiple loose stools per day.  Patient is encouraged to try over-the-counter Imodium if the number of stools are bothersome but she should return or report if onset of diarrhea reoccurs.  Patient will have CMP in follow-up of her diarrhea and dehydration - Comprehensive metabolic panel  3. Chronic hyponatremia Patient with history of chronic  hyponatremia and patient will have recheck of her sodium level - Comprehensive metabolic panel  4. Chronic heart failure with preserved ejection fraction (HCC) Patient with history of chronic heart failure but she appears to be stable at this time, patient reports no increased shortness of breath, no peripheral edema  5. H/O heart valve replacement with mechanical valve Patient with history of mechanical valve placement.  Discussed with patient the importance of compliance with her Coumadin as well as following up to have her levels rechecked to make sure that she is therapeutic.  Patient promised that she will call her cardiology office to schedule follow-up of her INR  6. Long term (current) use of anticoagulants Patient will have CBC to look for anemia or platelet disorder in light of her long-term use of warfarin for anticoagulation secondary to history of a mechanical valve - CBC with Differential  7. Anemia, unspecified type Patient has had anemia with hemoglobin of 10.9 during her recent hospitalization - CBC with Differential  8. Hypomagnesemia Patient with low magnesium during hospitalization with level of 1.4 and patient believes she did receive magnesium during her hospitalization but has not picked up prescription for magnesium that she was to take as an outpatient.  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  Will have social worker meet with the patient while she is here today's visit to offer resources  10. Mixed hyperlipidemia Patient with history of mixed hyperlipidemia but she is not currently on any statin medication.  Patient will have lipid panel at today's visit and will be notified of the results as well as suggestions regarding statin therapy - Lipid panel  11. Itching in the vaginal  area Patient with complaint of vaginal itching and request medication for treatment of possible yeast infection.  Prescription provided for Diflucan 150 mg x 1 but patient should schedule follow-up appointment for further evaluation/pelvic exam if her symptoms continue. - fluconazole (DIFLUCAN) 150 MG tablet; Take 1 tablet (150 mg total) by mouth once for 1 dose.  Dispense: 1 tablet; Refill: 0  *Patient was offered but declined influenza immunization at today's visit  An After Visit Summary was printed and given to the patient.  Return in about 3 months (around 08/30/2018), or if symptoms worsen or fail to improve, for chronic issues.

## 2018-05-31 NOTE — Progress Notes (Signed)
Patient declined a flu shot at today's visit.

## 2018-06-01 LAB — CBC WITH DIFFERENTIAL/PLATELET
Basophils Absolute: 0.1 x10E3/uL (ref 0.0–0.2)
Basos: 1 %
EOS (ABSOLUTE): 0.2 x10E3/uL (ref 0.0–0.4)
Eos: 4 %
Hematocrit: 36.1 % (ref 34.0–46.6)
Hemoglobin: 12 g/dL (ref 11.1–15.9)
Immature Grans (Abs): 0 x10E3/uL (ref 0.0–0.1)
Immature Granulocytes: 0 %
Lymphocytes Absolute: 1.4 x10E3/uL (ref 0.7–3.1)
Lymphs: 27 %
MCH: 35.5 pg — ABNORMAL HIGH (ref 26.6–33.0)
MCHC: 33.2 g/dL (ref 31.5–35.7)
MCV: 107 fL — ABNORMAL HIGH (ref 79–97)
Monocytes Absolute: 0.3 x10E3/uL (ref 0.1–0.9)
Monocytes: 6 %
Neutrophils Absolute: 3.3 x10E3/uL (ref 1.4–7.0)
Neutrophils: 62 %
Platelets: 554 x10E3/uL — ABNORMAL HIGH (ref 150–450)
RBC: 3.38 x10E6/uL — ABNORMAL LOW (ref 3.77–5.28)
RDW: 14.9 % (ref 12.3–15.4)
WBC: 5.3 x10E3/uL (ref 3.4–10.8)

## 2018-06-01 LAB — MAGNESIUM: Magnesium: 1.7 mg/dL (ref 1.6–2.3)

## 2018-06-01 LAB — COMPREHENSIVE METABOLIC PANEL WITH GFR
ALT: 13 IU/L (ref 0–32)
AST: 36 IU/L (ref 0–40)
Albumin/Globulin Ratio: 1.5 (ref 1.2–2.2)
Albumin: 3.9 g/dL (ref 3.5–5.5)
Alkaline Phosphatase: 122 IU/L — ABNORMAL HIGH (ref 39–117)
BUN/Creatinine Ratio: 14 (ref 9–23)
BUN: 12 mg/dL (ref 6–24)
Bilirubin Total: <0.2 mg/dL (ref 0.0–1.2)
CO2: 20 mmol/L (ref 20–29)
Calcium: 9.3 mg/dL (ref 8.7–10.2)
Chloride: 108 mmol/L — ABNORMAL HIGH (ref 96–106)
Creatinine, Ser: 0.87 mg/dL (ref 0.57–1.00)
GFR calc Af Amer: 87 mL/min/1.73
GFR calc non Af Amer: 75 mL/min/1.73
Globulin, Total: 2.6 g/dL (ref 1.5–4.5)
Glucose: 115 mg/dL — ABNORMAL HIGH (ref 65–99)
Potassium: 5.2 mmol/L (ref 3.5–5.2)
Sodium: 144 mmol/L (ref 134–144)
Total Protein: 6.5 g/dL (ref 6.0–8.5)

## 2018-06-01 LAB — LIPID PANEL
Chol/HDL Ratio: 2.8 ratio (ref 0.0–4.4)
Cholesterol, Total: 189 mg/dL (ref 100–199)
HDL: 68 mg/dL
LDL Calculated: 61 mg/dL (ref 0–99)
Triglycerides: 300 mg/dL — ABNORMAL HIGH (ref 0–149)
VLDL Cholesterol Cal: 60 mg/dL — ABNORMAL HIGH (ref 5–40)

## 2018-06-05 ENCOUNTER — Ambulatory Visit (INDEPENDENT_AMBULATORY_CARE_PROVIDER_SITE_OTHER): Payer: Self-pay | Admitting: *Deleted

## 2018-06-05 ENCOUNTER — Encounter (HOSPITAL_COMMUNITY): Payer: Self-pay | Admitting: Emergency Medicine

## 2018-06-05 ENCOUNTER — Emergency Department (HOSPITAL_COMMUNITY)
Admission: EM | Admit: 2018-06-05 | Discharge: 2018-06-05 | Disposition: A | Discharge status: Home / Self Care | Payer: Medicaid Other | Attending: Emergency Medicine | Admitting: Emergency Medicine

## 2018-06-05 DIAGNOSIS — Z7901 Long term (current) use of anticoagulants: Secondary | ICD-10-CM

## 2018-06-05 DIAGNOSIS — Z7982 Long term (current) use of aspirin: Secondary | ICD-10-CM | POA: Insufficient documentation

## 2018-06-05 DIAGNOSIS — I059 Rheumatic mitral valve disease, unspecified: Secondary | ICD-10-CM

## 2018-06-05 DIAGNOSIS — Z87891 Personal history of nicotine dependence: Secondary | ICD-10-CM | POA: Insufficient documentation

## 2018-06-05 DIAGNOSIS — N179 Acute kidney failure, unspecified: Secondary | ICD-10-CM | POA: Insufficient documentation

## 2018-06-05 DIAGNOSIS — R112 Nausea with vomiting, unspecified: Secondary | ICD-10-CM

## 2018-06-05 DIAGNOSIS — I5030 Unspecified diastolic (congestive) heart failure: Secondary | ICD-10-CM | POA: Insufficient documentation

## 2018-06-05 DIAGNOSIS — I110 Hypertensive heart disease with heart failure: Secondary | ICD-10-CM | POA: Insufficient documentation

## 2018-06-05 DIAGNOSIS — R197 Diarrhea, unspecified: Secondary | ICD-10-CM | POA: Insufficient documentation

## 2018-06-05 DIAGNOSIS — J45909 Unspecified asthma, uncomplicated: Secondary | ICD-10-CM | POA: Insufficient documentation

## 2018-06-05 LAB — COMPREHENSIVE METABOLIC PANEL
ALBUMIN: 4.3 g/dL (ref 3.5–5.0)
ALK PHOS: 153 U/L — AB (ref 38–126)
ALT: 24 U/L (ref 0–44)
AST: 54 U/L — ABNORMAL HIGH (ref 15–41)
Anion gap: 13 (ref 5–15)
BUN: 14 mg/dL (ref 6–20)
CHLORIDE: 106 mmol/L (ref 98–111)
CO2: 21 mmol/L — AB (ref 22–32)
Calcium: 10 mg/dL (ref 8.9–10.3)
Creatinine, Ser: 1.46 mg/dL — ABNORMAL HIGH (ref 0.44–1.00)
GFR calc Af Amer: 46 mL/min — ABNORMAL LOW (ref >60)
GFR calc non Af Amer: 39 mL/min — ABNORMAL LOW (ref >60)
GLUCOSE: 98 mg/dL (ref 70–99)
POTASSIUM: 4.8 mmol/L (ref 3.5–5.1)
SODIUM: 140 mmol/L (ref 135–145)
Total Bilirubin: 0.9 mg/dL (ref 0.3–1.2)
Total Protein: 8.1 g/dL (ref 6.5–8.1)

## 2018-06-05 LAB — URINALYSIS, ROUTINE W REFLEX MICROSCOPIC
Bilirubin Urine: NEGATIVE
Glucose, UA: NEGATIVE mg/dL
Ketones, ur: NEGATIVE mg/dL
Nitrite: NEGATIVE
Specific Gravity, Urine: 1.018 (ref 1.005–1.030)
Squamous Epithelial / LPF: >50 — ABNORMAL HIGH (ref 0–5)
pH: 5 (ref 5.0–8.0)

## 2018-06-05 LAB — CBC
HEMATOCRIT: 38.2 % (ref 36.0–46.0)
HEMOGLOBIN: 13.3 g/dL (ref 12.0–15.0)
MCH: 36 pg — AB (ref 26.0–34.0)
MCHC: 34.8 g/dL (ref 30.0–36.0)
MCV: 103.5 fL — AB (ref 78.0–100.0)
Platelets: 337 10*3/uL (ref 150–400)
RBC: 3.69 MIL/uL — ABNORMAL LOW (ref 3.87–5.11)
RDW: 14.4 % (ref 11.5–15.5)
WBC: 10 10*3/uL (ref 4.0–10.5)

## 2018-06-05 LAB — LIPASE, BLOOD: LIPASE: 26 U/L (ref 11–51)

## 2018-06-05 LAB — PROTIME-INR
INR: 1.77
PROTHROMBIN TIME: 20.5 s — AB (ref 11.4–15.2)

## 2018-06-05 LAB — POCT INR: INR: 2.3 (ref 2.0–3.0)

## 2018-06-05 MED ORDER — WARFARIN SODIUM 5 MG PO TABS: ORAL_TABLET | ORAL | 0 refills | Status: DC

## 2018-06-05 MED ORDER — LACTATED RINGERS IV BOLUS
1000.0000 mL | Freq: Once | INTRAVENOUS | Status: AC
  Administered 2018-06-05: 1000 mL via INTRAVENOUS

## 2018-06-05 MED ORDER — ONDANSETRON HCL 4 MG/2ML IJ SOLN
4.0000 mg | Freq: Once | INTRAMUSCULAR | Status: AC
  Administered 2018-06-05: 4 mg via INTRAVENOUS
  Filled 2018-06-05: qty 2

## 2018-06-05 MED ORDER — ONDANSETRON HCL 4 MG PO TABS: 4.0000 mg | ORAL_TABLET | Freq: Four times a day (QID) | ORAL | 0 refills | Status: AC

## 2018-06-05 NOTE — Patient Instructions (Signed)
Description   Today take 1.5 tablets then continue taking 1.5 tablets everyday except 1 tablet on Tuesdays, Thursdays, and Saturdays.  Recheck in 10 days. Continue eating the same amount of leafy green vegetables.  Main # [phone]

## 2018-06-05 NOTE — ED Provider Notes (Addendum)
[facility] DEPT Provider Note   CSN: [id number] Arrival date & time: 06/05/18  0815     History   Chief Complaint Chief Complaint  Patient presents with  . Emesis  . Diarrhea    HPI Mary [name] is a 57 y.o. female.  The history is provided by the patient.  Diarrhea   This is a new problem. The current episode started 2 days ago. The problem occurs 5 to 10 times per day. The problem has not changed since onset.The stool consistency is described as watery. There has been no fever. The fever has been present for less than 1 day. Associated symptoms include vomiting. Pertinent negatives include no abdominal pain, no chills, no sweats, no arthralgias, no myalgias, no URI and no cough. She has tried nothing for the symptoms. The treatment provided no relief. Risk factors include suspect food intake. Her past medical history does not include irritable bowel syndrome or inflammatory bowel disease.    Past Medical History:  Diagnosis Date  . Acute diastolic heart failure ([place]) 03/15/2013  . Anemia   . Carotid stenosis    Carotid US 2/17: bilateral ICA 1-39% >> FU prn  . Childhood asthma   . Complete heart block ([place]) 05/28/2013   Post-op  . Epigastric hernia 200's  . GERD (gastroesophageal reflux disease)   . Heart murmur   . History of blood transfusion    "14 w/1st pregnancy; 2 w/last C-section" (03/15/2013)  . Hx of echocardiogram    a. Echo 4/16:  Mild focal basal septal hypertrophy, EF 60-65%, no RWMA, Mechanical MVR ok with mild central regurgitation and no perivalvular leak, small mobile density attached to valvular ring (1.5x1 cm) - post surgical changes vs SBE, mild LAE;   b. Echo 2/17:  Mild post wall LVH, EF 55-60%, no RWMA, mechanical MVR ok (mean 5 mmHg), normal RVSF  . Hypertension    no pcp   will go to [facility]  . 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 S/P mitral valve replacement with metallic valve 2/95/2841   31mm  Sorin [employer]omed[employer] mechanical prosthesis via right mini thoracotomy approach  . 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   Complex valvuloplasty including Cor-matrix ECM patch augmentation of anterior and lateral leaflets with 53mm Edwards mc3 ring annuloplasty via right mini-thoracotomy approach  . Severe mitral regurgitation 04/22/2013  . Stroke ([place])   . SVT (supraventricular tachycardia) ([place]) 03/16/2013  . Tricuspid regurgitation     Patient Active Problem List   Diagnosis Date Noted  . Volume depletion 05/24/2018  . Acute kidney injury ([place]) 05/23/2018  . Gastroenteritis 05/23/2018  . Hyponatremia 04/18/2017  . Orthostatic hypotension 01/11/2017  . Chronic hyponatremia 01/11/2017  . Elevated liver enzymes 01/11/2017  . Atypical chest pain 06/05/2016  . Palpitations 03/20/2016  . Sinus tachycardia 02/01/2016  . AKI (acute kidney injury) ([place]) 01/10/2016  . Subtherapeutic international normalized ratio (INR) 01/10/2016  . TIA (transient ischemic attack) 09/13/2015  . History of rheumatic heart disease 09/13/2015  . CVA (cerebral vascular accident) ([place]) 09/12/2015  . SOB (shortness of breath) 02/24/2014  . First degree heart block 06/17/2013  . Weakness 06/16/2013  . (HFpEF) heart failure with preserved ejection fraction ([place]) 06/16/2013  . Anemia 06/16/2013  . History of bacterial endocarditis 06/16/2013  . Heart valve replaced by other means 06/10/2013  . Long term (current) use of anticoagulants 06/10/2013  . History of complete heart block 05/28/2013  . H/O heart valve replacement with mechanical valve 05/23/2013  .  S/P minimally-invasive tricuspid valve repair 05/23/2013  . Femoral hernia 05/23/2013  . Mitral valve insufficiency 04/22/2013  . Mitral valve disorders(424.0) 04/22/2013  . Tricuspid regurgitation 04/22/2013  . 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 SVT (supraventricular tachycardia) ([place]) 03/16/2013    Past Surgical History:  Procedure  Laterality Date  . APPENDECTOMY  [id number]  . CARDIAC CATHETERIZATION    . CARDIAC VALVE REPLACEMENT    . CESAREAN SECTION  1983  . CESAREAN SECTION WITH BILATERAL TUBAL LIGATION  1999  . FEMORAL HERNIA REPAIR Right 05/23/2013   Procedure: HERNIA REPAIR FEMORAL;  Surgeon: Rexene Alberts, MD;  Location: [facility];  Service: Open Heart Surgery;  Laterality: Right;  . INTRAOPERATIVE TRANSESOPHAGEAL ECHOCARDIOGRAM N/A 05/23/2013   Procedure: INTRAOPERATIVE TRANSESOPHAGEAL ECHOCARDIOGRAM;  Surgeon: Rexene Alberts, MD;  Location: [facility];  Service: Open Heart Surgery;  Laterality: N/A;  . LAPAROSCOPIC CHOLECYSTECTOMY  2003  . LEFT AND RIGHT HEART CATHETERIZATION WITH CORONARY ANGIOGRAM N/A 03/22/2013   Procedure: LEFT AND RIGHT HEART CATHETERIZATION WITH CORONARY ANGIOGRAM;  Surgeon: Burnell Blanks, MD;  Location: [facility] CATH LAB;  Service: Cardiovascular;  Laterality: N/A;  . MINIMALLY INVASIVE TRICUSPID VALVE REPAIR Right 05/23/2013   Procedure: MINIMALLY INVASIVE TRICUSPID VALVE REPAIR;  Surgeon: Rexene Alberts, MD;  Location: [facility];  Service: Open Heart Surgery;  Laterality: Right;  . MITRAL VALVE REPLACEMENT N/A 05/23/2013   Procedure: MITRAL VALVE (MV) REPLACEMENT;  Surgeon: Rexene Alberts, MD;  Location: [place];  Service: Open Heart Surgery;  Laterality: N/A;  . MULTIPLE EXTRACTIONS WITH ALVEOLOPLASTY N/A 04/04/2013   Procedure: Extraction of tooth #'s 1,8,9,13,14,15,23,24,25,26 with alveoloplasty and gross debridement of remaining teeth;  Surgeon: Lenn Cal, DDS;  Location: [facility];  Service: Oral Surgery;  Laterality: N/A;  . TEE WITHOUT CARDIOVERSION N/A 03/18/2013   Procedure: TRANSESOPHAGEAL ECHOCARDIOGRAM (TEE);  Surgeon: Larey Dresser, MD;  Location: [facility];  Service: Cardiovascular;  Laterality: N/A;  . TEE WITHOUT CARDIOVERSION N/A 06/17/2013   Procedure: TRANSESOPHAGEAL ECHOCARDIOGRAM (TEE);  Surgeon: Lelon Perla, MD;  Location: [facility] ENDOSCOPY;  Service: Cardiovascular;   Laterality: N/A;  . TUBAL LIGATION  1999     OB History   None      Home Medications    Prior to Admission medications   Medication Sig Start Date End Date Taking? Authorizing Provider  aspirin EC 81 MG tablet Take 1 tablet (81 mg total) by mouth daily. 08/26/13  Yes Larey Dresser, MD  Hyprom-Naphaz-Polysorb-Zn Sulf (CLEAR EYES COMPLETE OP) Place 2 drops into both eyes daily as needed (red eyes).   Yes [provider]  warfarin (COUMADIN) 5 MG tablet Take as directed by coumadin clinic Patient taking differently: Take 5-7.5 mg by mouth See admin instructions. Take 5 mg by mouth on Tuesday, Thursday and Saturday at 6 pm then take 7.5 mg by mouth on Monday, Wednesday, Friday and Sunday 06/05/18  Yes Evans Lance, MD  [facility] HAZEL EX Apply 1 application topically at bedtime as needed (knee pain, bruise on arm).    Yes [provider]  ondansetron (ZOFRAN) 4 MG tablet Take 1 tablet (4 mg total) by mouth every 6 (six) hours for 12 doses. 06/05/18 06/08/18  Lennice Sites, DO    Family History Family History  Problem Relation Age of Onset  . Cancer Mother   . Stroke Father   . Sarcoidosis Sister   . Heart attack Neg Hx     Social History Social History   Tobacco  Use  . Smoking status: Former Smoker    Packs/day: 0.50    Years: 36.00    Pack years: 18.00    Types: Cigarettes    Last attempt to quit: 03/15/2013    Years since quitting: 5.2  . Smokeless tobacco: Never Used  Substance Use Topics  . Alcohol use: Yes    Comment: 01/11/2017  "glass of wine/month"  . Drug use: No     Allergies   Oxycodone   Review of Systems Review of Systems  Constitutional: Negative for chills and fever.  HENT: Negative for ear pain and sore throat.   Eyes: Negative for pain and visual disturbance.  Respiratory: Negative for cough and shortness of breath.   Cardiovascular: Negative for chest pain and palpitations.  Gastrointestinal: Positive for diarrhea, nausea and  vomiting. Negative for abdominal distention, abdominal pain, anal bleeding, blood in stool, constipation and rectal pain.  Genitourinary: Negative for dysuria and hematuria.  Musculoskeletal: Negative for arthralgias, back pain and myalgias.  Skin: Negative for color change and rash.  Neurological: Negative for seizures and syncope.  All other systems reviewed and are negative.    Physical Exam Updated Vital Signs  ED Triage Vitals  Enc Vitals Group     BP 06/05/18 0822 118/87     Pulse Rate 06/05/18 0822 (!) 112     Resp 06/05/18 0822 18     Temp 06/05/18 0822 98.4 F (36.9 C)     Temp Source 06/05/18 0822 Oral     SpO2 06/05/18 0822 100 %     Weight --      Height --      Head Circumference --      Peak Flow --      Pain Score 06/05/18 0821 0     Pain Loc --      Pain Edu? --      Excl. in [place]? --     Physical Exam  Constitutional: She appears well-developed and well-nourished. No distress.  HENT:  Head: Normocephalic and atraumatic.  Mouth/Throat: No oropharyngeal exudate.  Eyes: Pupils are equal, round, and reactive to light. Conjunctivae and EOM are normal.  Neck: Normal range of motion. Neck supple.  Cardiovascular: Regular rhythm, normal heart sounds and intact distal pulses. Tachycardia present.  No murmur heard. Pulmonary/Chest: Effort normal and breath sounds normal. No respiratory distress. She has no wheezes.  Abdominal: Soft. She exhibits no distension. There is no tenderness. There is no rebound and no guarding.  Musculoskeletal: Normal range of motion. She exhibits no edema.  Neurological: She is alert.  Skin: Skin is warm and dry.  Psychiatric: She has a normal mood and affect.  Nursing note and vitals reviewed.    ED Treatments / Results  Labs (all labs ordered are listed, but only abnormal results are displayed) Labs Reviewed  COMPREHENSIVE METABOLIC PANEL - Abnormal; Notable for the following components:      Result Value   CO2 21 (*)     Creatinine, Ser 1.46 (*)    AST 54 (*)    Alkaline Phosphatase 153 (*)    GFR calc non Af Amer 39 (*)    GFR calc Af Amer 46 (*)    All other components within normal limits  CBC - Abnormal; Notable for the following components:   RBC 3.69 (*)    MCV 103.5 (*)    MCH 36.0 (*)    All other components within normal limits  URINALYSIS, ROUTINE W REFLEX MICROSCOPIC -  Abnormal; Notable for the following components:   Color, Urine AMBER (*)    APPearance CLOUDY (*)    Hgb urine dipstick LARGE (*)    Protein, ur >=300 (*)    Leukocytes, UA TRACE (*)    Bacteria, UA RARE (*)    Squamous Epithelial / LPF >50 (*)    All other components within normal limits  PROTIME-INR - Abnormal; Notable for the following components:   Prothrombin Time 20.5 (*)    All other components within normal limits  LIPASE, BLOOD    EKG None  Radiology No results found.  Procedures Procedures (including critical care time)  Medications Ordered in ED Medications  ondansetron (ZOFRAN) injection 4 mg (4 mg Intravenous Given 06/05/18 0921)  lactated ringers bolus 1,000 mL (1,000 mLs Intravenous New Bag/Given 06/05/18 0903)     Initial Impression / Assessment and Plan / ED Course  I have reviewed the triage vital signs and the nursing notes.  Pertinent labs & imaging results that were available during my care of the patient were reviewed by me and considered in my medical decision making (see chart for details).     Nariyah CINDIA HUSTEAD is a 57 year old female with history of heart failure, mitral valve replacement on Coumadin who presents to the ED with nausea, vomiting, diarrhea.  Patient with mild tachycardia but otherwise normal vitals.  No fever.  Patient with similar episode several weeks ago and was admitted for hydration and discharged.  Patient had negative C. difficile and negative GI stool pathogen panel.  She has been without symptoms for the last few days until 2 days ago when she developed nausea,  vomiting, diarrhea again.  She denies any abdominal pain.  No abdominal tenderness on exam.  She has followed up with primary care doctor and is having scheduled arrangement with GI for possible colonoscopy to rule out inflammatory bowel disease.  Patient overall well-appearing.  Lab work showed no significant anemia, electrolyte abnormality, leukocytosis.  Gallbladder and liver enzymes within normal limits.  Patient with elevated creatinine from baseline.  Likely from dehydration given emesis, diarrhea.  Patient was given a liter bolus and IV Zofran in the ED and feels improved.  Had discussion with the patient about need for admission for IV hydration and she would like to try oral hydration at home and follow-up with her primary care doctor for repeat creatinine.  She has capacity to make this decision and feel comfortable that patient will follow-up.  Shared decision making employed. Patient aware INR is low and went to INR clinic today and has new rx waiting for her. She was able to tolerate p.o. well in the ED and given strict return precautions.  Patient with repeat abdominal exam without any tenderness.  Doubt any ongoing inflammatory process, infectious process, gallbladder or liver process.  Suspect possible enteritis.  Recommend follow-up as discussed and patient was discharged in good condition.  This chart was dictated using voice recognition software.  Despite best efforts to proofread,  errors can occur which can change the documentation meaning.  Final Clinical Impressions(s) / ED Diagnoses   Final diagnoses:  Nausea vomiting and diarrhea  AKI (acute kidney injury) [facility])    ED Discharge Orders         Ordered    ondansetron (ZOFRAN) 4 MG tablet  Every 6 hours     06/05/18 1059           Lennice Sites, DO 06/05/18 1108    Sharissa Brierley, DO  06/05/18 1111  

## 2018-06-05 NOTE — ED Triage Notes (Signed)
Pt c/o n/v/d since yesterday. Reports she was seen here recently for the same symptoms. Denies pain at this time.

## 2018-06-05 NOTE — ED Notes (Signed)
Pt is postmenopausal and therefore does not need an HCG lab drawn

## 2018-06-05 NOTE — Discharge Instructions (Signed)
Please follow-up with primary care doctor tomorrow for repeat blood work.  You need your kidney function rechecked as we discussed.  Increase oral hydration.  If symptoms worsen please return to the ED as we discussed.  Follow-up with GI doctor as scheduled.

## 2018-06-14 ENCOUNTER — Ambulatory Visit (INDEPENDENT_AMBULATORY_CARE_PROVIDER_SITE_OTHER): Payer: Self-pay | Admitting: *Deleted

## 2018-06-14 DIAGNOSIS — I059 Rheumatic mitral valve disease, unspecified: Secondary | ICD-10-CM

## 2018-06-14 DIAGNOSIS — Z7901 Long term (current) use of anticoagulants: Secondary | ICD-10-CM

## 2018-06-14 LAB — POCT INR: INR: 3.4 — AB (ref 2.0–3.0)

## 2018-06-14 NOTE — Patient Instructions (Signed)
Description   Continue taking 1.5 tablets everyday except 1 tablet on Tuesdays, Thursdays, and Saturdays.  Recheck in 2 weeks. Continue eating the same amount of leafy green vegetables.  Main # [phone]

## 2018-06-22 ENCOUNTER — Inpatient Hospital Stay: Payer: Self-pay | Admitting: Family Medicine

## 2018-07-31 ENCOUNTER — Ambulatory Visit (INDEPENDENT_AMBULATORY_CARE_PROVIDER_SITE_OTHER): Payer: Self-pay | Admitting: *Deleted

## 2018-07-31 DIAGNOSIS — I059 Rheumatic mitral valve disease, unspecified: Secondary | ICD-10-CM

## 2018-07-31 DIAGNOSIS — Z7901 Long term (current) use of anticoagulants: Secondary | ICD-10-CM

## 2018-07-31 LAB — POCT INR: INR: 1.9 — AB (ref 2.0–3.0)

## 2018-07-31 NOTE — Patient Instructions (Signed)
Description   Today take 1.5 tablets, tomorrow take 2 tablets, then Continue taking 1.5 tablets everyday except 1 tablet on Tuesdays, Thursdays, and Saturdays.  Recheck in 2 weeks. Continue eating the same amount of leafy green vegetables.  Main # [phone]

## 2018-08-14 ENCOUNTER — Ambulatory Visit (INDEPENDENT_AMBULATORY_CARE_PROVIDER_SITE_OTHER): Payer: Self-pay | Admitting: *Deleted

## 2018-08-14 ENCOUNTER — Encounter: Payer: Self-pay | Admitting: Family Medicine

## 2018-08-14 ENCOUNTER — Ambulatory Visit: Payer: Self-pay | Attending: Family Medicine | Admitting: Family Medicine

## 2018-08-14 VITALS — BP 104/67 | HR 102 | Temp 98.4°F | Resp 18 | Ht 62.0 in | Wt 127.0 lb

## 2018-08-14 DIAGNOSIS — Z87891 Personal history of nicotine dependence: Secondary | ICD-10-CM | POA: Insufficient documentation

## 2018-08-14 DIAGNOSIS — Z8673 Personal history of transient ischemic attack (TIA), and cerebral infarction without residual deficits: Secondary | ICD-10-CM | POA: Insufficient documentation

## 2018-08-14 DIAGNOSIS — Z7982 Long term (current) use of aspirin: Secondary | ICD-10-CM | POA: Insufficient documentation

## 2018-08-14 DIAGNOSIS — Z0000 Encounter for general adult medical examination without abnormal findings: Secondary | ICD-10-CM

## 2018-08-14 DIAGNOSIS — I110 Hypertensive heart disease with heart failure: Secondary | ICD-10-CM | POA: Insufficient documentation

## 2018-08-14 DIAGNOSIS — Z111 Encounter for screening for respiratory tuberculosis: Secondary | ICD-10-CM

## 2018-08-14 DIAGNOSIS — Z952 Presence of prosthetic heart valve: Secondary | ICD-10-CM | POA: Insufficient documentation

## 2018-08-14 DIAGNOSIS — Z1239 Encounter for other screening for malignant neoplasm of breast: Secondary | ICD-10-CM

## 2018-08-14 DIAGNOSIS — I5032 Chronic diastolic (congestive) heart failure: Secondary | ICD-10-CM | POA: Insufficient documentation

## 2018-08-14 DIAGNOSIS — R011 Cardiac murmur, unspecified: Secondary | ICD-10-CM | POA: Insufficient documentation

## 2018-08-14 DIAGNOSIS — I059 Rheumatic mitral valve disease, unspecified: Secondary | ICD-10-CM

## 2018-08-14 DIAGNOSIS — Z7901 Long term (current) use of anticoagulants: Secondary | ICD-10-CM

## 2018-08-14 DIAGNOSIS — K219 Gastro-esophageal reflux disease without esophagitis: Secondary | ICD-10-CM | POA: Insufficient documentation

## 2018-08-14 DIAGNOSIS — I083 Combined rheumatic disorders of mitral, aortic and tricuspid valves: Secondary | ICD-10-CM | POA: Insufficient documentation

## 2018-08-14 DIAGNOSIS — Z1211 Encounter for screening for malignant neoplasm of colon: Secondary | ICD-10-CM

## 2018-08-14 LAB — POCT INR: INR: 4.7 — AB (ref 2.0–3.0)

## 2018-08-14 NOTE — Patient Instructions (Signed)

## 2018-08-14 NOTE — Patient Instructions (Signed)
Description   Skip today's dose, tomorrow only take 1 tablet , then Continue taking 1.5 tablets everyday except 1 tablet on Tuesdays, Thursdays, and Saturdays.  Recheck in 2 weeks. Continue eating the same amount of leafy green vegetables.  Main # [phone]

## 2018-08-14 NOTE — Progress Notes (Signed)
Subjective:    Patient ID: Mary [name], female    DOB: 1960-12-01, 57 y.o.   MRN: [id number]  HPI 57 yo female here for annual well exam.  Patient reports that overall she feels well at today's visit.  Patient has not had a recent mammogram.  Patient does not routinely perform self breast exams.  Patient states that she had her last period in 2014 after she had surgery for heart valve replacement.  Patient states that she is not interested in a pelvic exam or Pap smear at today's visit.  Patient reports no history of abnormal Pap smears.  Patient reports no family history of GYN cancers.  There is no family history per patient of breast, uterine, cervical or ovarian cancer.  Patient states that her mother did have recent diagnosis of kidney cancer requiring removal of one kidney.      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  Patient needs a physical exam form completed.  Patient also needs a TB test/PPD placement at today's visit.  Patient denies any history of positive PPD.  Patient reports prior history of smoking but has not smoked since 2014.  Patient states that overall she feels well and that she followed up earlier today with Coumadin clinic.  Patient states that her INR was slightly elevated and she will be contacted today regarding changes in her medication.  Patient reports no unusual bruising or bleeding.  Patient denies any issues with chest pain but patient has constant sensation of palpitations which she believes is related to her mechanical heart valve.  Patient denies shortness of breath or cough.  No headaches or dizziness.  Patient denies any abdominal pain.  Patient denies any vaginal bleeding, no blood in the stool and no blood in the urine.  Patient does not wish to have colonoscopy but she agrees to have fecal occult blood testing.      Patient does report 2 days of clear nasal drainage, nasal congestion, postnasal drainage and sore throat secondary to postnasal  drainage.  No fever or chills. Past Medical History:  Diagnosis Date  . Acute diastolic heart failure ([place]) 03/15/2013  . Anemia   . Carotid stenosis    Carotid US 2/17: bilateral ICA 1-39% >> FU prn  . Childhood asthma   . Complete heart block ([place]) 05/28/2013   Post-op  . Epigastric hernia 200's  . GERD (gastroesophageal reflux disease)   . Heart murmur   . History of blood transfusion    "14 w/1st pregnancy; 2 w/last C-section" (03/15/2013)  . Hx of echocardiogram    a. Echo 4/16:  Mild focal basal septal hypertrophy, EF 60-65%, no RWMA, Mechanical MVR ok with mild central regurgitation and no perivalvular leak, small mobile density attached to valvular ring (1.5x1 cm) - post surgical changes vs SBE, mild LAE;   b. Echo 2/17:  Mild post wall LVH, EF 55-60%, no RWMA, mechanical MVR ok (mean 5 mmHg), normal RVSF  . Hypertension    no pcp   will go to [facility]  . 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 S/P mitral valve replacement with metallic valve 7/32/2025   35mm Sorin Carbomedics mechanical prosthesis via right mini thoracotomy approach  . 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 Severe mitral regurgitation 04/22/2013  . Stroke ([place])   . SVT (supraventricular  tachycardia) ([place]) 03/16/2013  . Tricuspid regurgitation    Past Surgical History:  Procedure Laterality Date  . APPENDECTOMY  [id number]  . CARDIAC CATHETERIZATION    . CARDIAC VALVE REPLACEMENT    . CESAREAN SECTION  1983  . CESAREAN SECTION WITH BILATERAL TUBAL LIGATION  1999  . FEMORAL HERNIA REPAIR Right 05/23/2013   Procedure: HERNIA REPAIR FEMORAL;  Surgeon: Rexene Alberts, MD;  Location: [facility];  Service: Open Heart Surgery;  Laterality: Right;  . INTRAOPERATIVE TRANSESOPHAGEAL ECHOCARDIOGRAM N/A 05/23/2013   Procedure: INTRAOPERATIVE  TRANSESOPHAGEAL ECHOCARDIOGRAM;  Surgeon: Rexene Alberts, MD;  Location: [facility];  Service: Open Heart Surgery;  Laterality: N/A;  . LAPAROSCOPIC CHOLECYSTECTOMY  2003  . LEFT AND RIGHT HEART CATHETERIZATION WITH CORONARY ANGIOGRAM N/A 03/22/2013   Procedure: LEFT AND RIGHT HEART CATHETERIZATION WITH CORONARY ANGIOGRAM;  Surgeon: Burnell Blanks, MD;  Location: [facility] CATH LAB;  Service: Cardiovascular;  Laterality: N/A;  . MINIMALLY INVASIVE TRICUSPID VALVE REPAIR Right 05/23/2013   Procedure: MINIMALLY INVASIVE TRICUSPID VALVE REPAIR;  Surgeon: Rexene Alberts, MD;  Location: [facility];  Service: Open Heart Surgery;  Laterality: Right;  . MITRAL VALVE REPLACEMENT N/A 05/23/2013   Procedure: MITRAL VALVE (MV) REPLACEMENT;  Surgeon: Rexene Alberts, MD;  Location: [facility];  Service: Open Heart Surgery;  Laterality: N/A;  . MULTIPLE EXTRACTIONS WITH ALVEOLOPLASTY N/A 04/04/2013   Procedure: Extraction of tooth #'s 1,8,9,13,14,15,23,24,25,26 with alveoloplasty and gross debridement of remaining teeth;  Surgeon: Lenn Cal, DDS;  Location: [facility];  Service: Oral Surgery;  Laterality: N/A;  . TEE WITHOUT CARDIOVERSION N/A 03/18/2013   Procedure: TRANSESOPHAGEAL ECHOCARDIOGRAM (TEE);  Surgeon: Larey Dresser, MD;  Location: [facility];  Service: Cardiovascular;  Laterality: N/A;  . TEE WITHOUT CARDIOVERSION N/A 06/17/2013   Procedure: TRANSESOPHAGEAL ECHOCARDIOGRAM (TEE);  Surgeon: Lelon Perla, MD;  Location[facility] ENDOSCOPY;  Service: Cardiovascular;  Laterality: N/A;  . TUBAL LIGATION  1999   Family History  Problem Relation Age of Onset  . Cancer Mother   . Stroke Father   . Sarcoidosis Sister   . Heart attack Neg Hx    Social History   Tobacco Use  . Smoking status: Former Smoker    Packs/day: 0.50    Years: 36.00    Pack years: 18.00    Types: Cigarettes    Last attempt to quit: 03/15/2013    Years since quitting: 5.4  . Smokeless tobacco: Never Used  Substance Use Topics  .  Alcohol use: Yes    Comment: 01/11/2017  "glass of wine/month"  . Drug use: No   Allergies  Allergen Reactions  . Oxycodone Itching   Current Outpatient Medications on File Prior to Visit  Medication Sig Dispense Refill  . aspirin EC 81 MG tablet Take 1 tablet (81 mg total) by mouth daily.    . Hyprom-Naphaz-Polysorb-Zn Sulf (CLEAR EYES COMPLETE OP) Place 2 drops into both eyes daily as needed (red eyes).    . warfarin (COUMADIN) 5 MG tablet Take as directed by coumadin clinic (Patient taking differently: Take 5-7.5 mg by mouth See admin instructions. Take 5 mg by mouth on Tuesday, Thursday and Saturday at 6 pm then take 7.5 mg by mouth on Monday, Wednesday, Friday and Sunday) 50 tablet 0  . WITCH HAZEL EX Apply 1 application topically at bedtime as needed (knee pain, bruise on arm).      No current facility-administered medications on file prior to visit.      Review of Systems  Constitutional: Negative for chills, fatigue and fever.  HENT: Positive for congestion, postnasal drip and sore throat. Negative for hearing loss, nosebleeds, trouble swallowing and voice change.   Eyes: Negative for photophobia and visual disturbance.  Respiratory: Negative for cough, chest tightness, shortness of breath and wheezing.   Cardiovascular: Positive for palpitations. Negative for chest pain and leg swelling.  Gastrointestinal: Negative for abdominal distention, abdominal pain, anal bleeding, blood in stool, constipation, diarrhea, nausea, rectal pain and vomiting.  Endocrine: Negative for polydipsia, polyphagia and polyuria.  Genitourinary: Negative for dysuria, flank pain, frequency, hematuria, pelvic pain, vaginal bleeding, vaginal discharge and vaginal pain.  Neurological: Negative for dizziness, syncope, weakness, light-headedness, numbness and headaches.  Hematological: Negative for adenopathy. Does not bruise/bleed easily.  Psychiatric/Behavioral: Negative for self-injury, sleep disturbance  and suicidal ideas. The patient is not nervous/anxious.        Objective:   Physical Exam BP 104/67 (BP Location: Left Arm, Patient Position: Sitting, Cuff Size: Normal)   Pulse (!) 102   Temp 98.4 F (36.9 C) (Oral)   Resp 18   Ht 5\' 2"  (1.575 m)   Wt 127 lb (57.6 kg)   LMP 03/22/2013   BMI 23.23 kg/m  nurse's notes and vital signs reviewed General-well-nourished, well-developed older female in no acute distress ENT-TMs dull, nares with moderate edema of the nasal turbinates with mild edema/erythema and clear nasal discharge, patient with mild posterior pharynx erythema, no exudate noted.  Patient with her natural teeth but is missing some of her molars and has some mild dental caries Neck-supple, no lymphadenopathy, no thyromegaly, no carotid bruit.  Patient does have some mild radiation of noise from artificial valve into the carotids Cardiovascular-regular rate and rhythm, patient with mechanical click/noise secondary to artificial heart valve Lungs-clear to auscultation bilaterally Breast exam- declined by patient Abdomen-soft, nontender Back-no CVA tenderness GU-exam declined by patient Extremities-no edema Psych-normal mood and judgment       Assessment & Plan:  1. Annual physical exam Patient with annual physical exam at today's visit.  Patient declined breast and pelvic exam.  Patient did agree to have screening mammogram and patient will be referred to the breast center to have this completed.  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  Patient given handout on health maintenance as part of her AVS.  Patient is encouraged to continue a healthy diet as long with regular exercise.  Weightbearing exercise can help with prevention of osteoporosis.  Patient had employment physical form completed and copy was made to be scanned into the chart. - MM Digital Screening; Future - PPD  2. Screening for breast cancer Self breast exams discussed.  Patient is  encouraged to perform monthly.  Patient will be referred for screening mammogram and information given on scholarship program for mammograms as patient is currently uninsured - MM Digital Screening; Future  3. Screening for colon cancer Patient declines referral for colonoscopy as she is uninsured.  Patient agrees to do fecal occult blood testing but I discussed with the patient that this could be falsely positive because of her use of Coumadin and patient should wait until her INR is within a normal range before performing FOBT - Fecal occult blood, imunochemical([facility])  4. Screening for tuberculosis Patient will have PPD testing as a screening test for tuberculosis as patient works in childcare.  Patient denies any recent issues with cough, no hemoptysis, no night sweats and no unexplained weight loss.  An After Visit Summary was printed  and given to the patient.  Return in about 3 months (around 11/14/2018) for fasting f/u chronic issues.

## 2018-08-17 LAB — TB SKIN TEST
Induration: 0 mm
TB Skin Test: NEGATIVE

## 2018-08-22 LAB — FECAL OCCULT BLOOD, IMMUNOCHEMICAL: Fecal Occult Bld: NEGATIVE

## 2018-10-10 ENCOUNTER — Telehealth: Payer: Self-pay | Admitting: Family Medicine

## 2018-10-10 NOTE — Telephone Encounter (Signed)
Pt states at her appt 07/2018 provider completed physical form for her employer.  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

## 2018-10-10 NOTE — Telephone Encounter (Signed)
Patient states the form can not say "as you know" which gives the job the impression there is some wavering in your medical decision. 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

## 2018-10-10 NOTE — Telephone Encounter (Signed)
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  ATTENTION:  [employer] @ [place].

## 2018-10-10 NOTE — Telephone Encounter (Signed)
I will need to review the note-did she bring it back to the office?

## 2018-10-30 ENCOUNTER — Ambulatory Visit (INDEPENDENT_AMBULATORY_CARE_PROVIDER_SITE_OTHER): Payer: Self-pay

## 2018-10-30 ENCOUNTER — Encounter (INDEPENDENT_AMBULATORY_CARE_PROVIDER_SITE_OTHER): Payer: Self-pay

## 2018-10-30 DIAGNOSIS — Z7901 Long term (current) use of anticoagulants: Secondary | ICD-10-CM

## 2018-10-30 DIAGNOSIS — I059 Rheumatic mitral valve disease, unspecified: Secondary | ICD-10-CM

## 2018-10-30 LAB — POCT INR: INR: 4.4 — AB (ref 2.0–3.0)

## 2018-10-30 MED ORDER — WARFARIN SODIUM 5 MG PO TABS: ORAL_TABLET | ORAL | 0 refills | Status: DC

## 2018-10-30 NOTE — Patient Instructions (Signed)
Skip today's dose then START NEW DOSAGE of 1 tablet everyday except 1.5 tablet on Tuesdays, Thursdays, and Saturdays.  Recheck in 2 weeks. Continue eating the same amount of leafy green vegetables.  Main # [phone]

## 2018-10-31 ENCOUNTER — Telehealth: Payer: Self-pay

## 2018-10-31 NOTE — Telephone Encounter (Signed)
Notified Pt that medical report was completed.  Ready for pick up at the front desk.

## 2018-11-20 ENCOUNTER — Ambulatory Visit: Payer: Self-pay | Attending: Family Medicine | Admitting: Family Medicine

## 2018-11-20 ENCOUNTER — Encounter: Payer: Self-pay | Admitting: Family Medicine

## 2018-11-20 VITALS — BP 133/86 | HR 59 | Temp 98.1°F | Ht 62.0 in | Wt 129.0 lb

## 2018-11-20 DIAGNOSIS — I6529 Occlusion and stenosis of unspecified carotid artery: Secondary | ICD-10-CM | POA: Insufficient documentation

## 2018-11-20 DIAGNOSIS — N183 Chronic kidney disease, stage 3 unspecified: Secondary | ICD-10-CM

## 2018-11-20 DIAGNOSIS — Z7982 Long term (current) use of aspirin: Secondary | ICD-10-CM | POA: Insufficient documentation

## 2018-11-20 DIAGNOSIS — Z87891 Personal history of nicotine dependence: Secondary | ICD-10-CM | POA: Insufficient documentation

## 2018-11-20 DIAGNOSIS — I442 Atrioventricular block, complete: Secondary | ICD-10-CM

## 2018-11-20 DIAGNOSIS — E781 Pure hyperglyceridemia: Secondary | ICD-10-CM | POA: Insufficient documentation

## 2018-11-20 DIAGNOSIS — Z809 Family history of malignant neoplasm, unspecified: Secondary | ICD-10-CM | POA: Insufficient documentation

## 2018-11-20 DIAGNOSIS — D7589 Other specified diseases of blood and blood-forming organs: Secondary | ICD-10-CM | POA: Insufficient documentation

## 2018-11-20 DIAGNOSIS — I10 Essential (primary) hypertension: Secondary | ICD-10-CM

## 2018-11-20 DIAGNOSIS — Z7901 Long term (current) use of anticoagulants: Secondary | ICD-10-CM

## 2018-11-20 DIAGNOSIS — Z79899 Other long term (current) drug therapy: Secondary | ICD-10-CM

## 2018-11-20 DIAGNOSIS — Z8673 Personal history of transient ischemic attack (TIA), and cerebral infarction without residual deficits: Secondary | ICD-10-CM | POA: Insufficient documentation

## 2018-11-20 DIAGNOSIS — Z952 Presence of prosthetic heart valve: Secondary | ICD-10-CM | POA: Insufficient documentation

## 2018-11-20 DIAGNOSIS — I130 Hypertensive heart and chronic kidney disease with heart failure and stage 1 through stage 4 chronic kidney disease, or unspecified chronic kidney disease: Secondary | ICD-10-CM | POA: Insufficient documentation

## 2018-11-20 DIAGNOSIS — Z885 Allergy status to narcotic agent status: Secondary | ICD-10-CM | POA: Insufficient documentation

## 2018-11-20 DIAGNOSIS — I5032 Chronic diastolic (congestive) heart failure: Secondary | ICD-10-CM

## 2018-11-20 DIAGNOSIS — D649 Anemia, unspecified: Secondary | ICD-10-CM

## 2018-11-20 DIAGNOSIS — R748 Abnormal levels of other serum enzymes: Secondary | ICD-10-CM

## 2018-11-20 NOTE — Progress Notes (Signed)
Patient here to re-establish care and physical, also needs form completed.

## 2018-11-20 NOTE — Progress Notes (Signed)
Subjective:    Patient ID: Mary [name], female    DOB: February 26, 1961, 58 y.o.   MRN: [id number]  HPI       58 yo female who was last seen on 08/14/2018 for annual well exam.  Patient presents in today's visit in follow-up of chronic medical issues which include essential hypertension, stage III chronic kidney disease, chronic diastolic heart failure, anemia, elevated liver enzymes, history of SVT, history of CVA,  carotid stenosis, GERD and transient complete heart block with prior episodes of syncope.  Patient also with history of tricuspid valve repair and mechanical mitral valve replacement for which she is on chronic anticoagulation with Coumadin.  Patient reports that she did follow-up with cardiology recently for Coumadin/INR check.  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  Patient reports no recent syncopal episodes.      Patient reports that overall she feels well at this time.  Patient is not currently on any medication for her hypertension.  Patient has had no headaches or dizziness related to her blood pressure she feels that her blood pressures been controlled.  Patient is on chronic Coumadin therapy status post mechanical valve replacement.  Patient denies any unusual bruising or bleeding.  Patient is not currently on statin medication as she felt that this medication caused her to have muscle aches with past use.  Patient reports no recent follow-up with the cardiologist but does go to Coumadin clinic.  Patient denies any chest pain and no sensation of palpitations or increased heart rate.  Patient denies any shortness of breath or cough.  Patient reports that she had asthma in  childhood believes that she is now grown out of it.  Patient denies any increased peripheral edema related to CHF and no shortness of breath.  Patient denies any abdominal pain, no blood in the stool and no dark stools.  Patient is no longer on reflux medicine as she is not having any symptoms.  Patient denies any current symptoms of fatigue and she believes that she is no longer anemic.  Patient has had no dizziness or focal numbness/weakness related to history of stroke and mild carotid stenosis.        Past Medical History:  Diagnosis Date  . Acute diastolic heart failure ([place]) 03/15/2013  . Anemia   . Carotid stenosis    Carotid US 2/17: bilateral ICA 1-39% >> FU prn  . Childhood asthma   . Complete heart block ([place]) 05/28/2013   Post-op  . Epigastric hernia 200's  . GERD (gastroesophageal reflux disease)   . Heart murmur   . History of blood transfusion    "14 w/1st pregnancy; 2 w/last C-section" (03/15/2013)  . Hx of echocardiogram    a. Echo 4/16:  Mild focal basal septal hypertrophy, EF 60-65%, no RWMA, Mechanical MVR ok with mild central regurgitation and no perivalvular leak, small mobile density attached to valvular ring (1.5x1 cm) - post surgical changes vs SBE, mild LAE;   b. Echo 2/17:  Mild post wall LVH, EF 55-60%, no RWMA, mechanical MVR ok (mean 5 mmHg), normal RVSF  . Hypertension    no pcp  will go to [facility]  . 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 S/P mitral valve replacement with metallic valve 03/21/9484   37mm Sorin Carbomedics mechanical prosthesis via right mini thoracotomy approach  . 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 Severe mitral regurgitation 04/22/2013  . Stroke ([place])   . SVT (supraventricular tachycardia) ([place]) 03/16/2013  . Tricuspid regurgitation    Past  Surgical History:  Procedure Laterality Date  . APPENDECTOMY  [id number]  . CARDIAC CATHETERIZATION    . CARDIAC VALVE REPLACEMENT    . CESAREAN SECTION  1983  . CESAREAN SECTION WITH BILATERAL TUBAL LIGATION  1999  . FEMORAL HERNIA REPAIR Right 05/23/2013   Procedure: HERNIA REPAIR FEMORAL;  Surgeon: Rexene Alberts, MD;  Location: [place];  Service: Open Heart Surgery;  Laterality: Right;  . INTRAOPERATIVE TRANSESOPHAGEAL ECHOCARDIOGRAM N/A 05/23/2013   Procedure: INTRAOPERATIVE TRANSESOPHAGEAL ECHOCARDIOGRAM;  Surgeon: Rexene Alberts, MD;  Location: [place];  Service: Open Heart Surgery;  Laterality: N/A;  . LAPAROSCOPIC CHOLECYSTECTOMY  2003  . LEFT AND RIGHT HEART CATHETERIZATION WITH CORONARY ANGIOGRAM N/A 03/22/2013   Procedure: LEFT AND RIGHT HEART CATHETERIZATION WITH CORONARY ANGIOGRAM;  Surgeon: Burnell Blanks, MD;  Location: [facility] CATH LAB;  Service: Cardiovascular;  Laterality: N/A;  . MINIMALLY INVASIVE TRICUSPID VALVE REPAIR Right 05/23/2013   Procedure: MINIMALLY INVASIVE TRICUSPID VALVE REPAIR;  Surgeon: Rexene Alberts, MD;  Location: [place];  Service: Open Heart Surgery;  Laterality: Right;  . MITRAL VALVE REPLACEMENT N/A 05/23/2013   Procedure: MITRAL VALVE (MV) REPLACEMENT;  Surgeon: Rexene Alberts, MD;  Location: [place];  Service: Open Heart Surgery;  Laterality: N/A;  . MULTIPLE EXTRACTIONS WITH ALVEOLOPLASTY N/A 04/04/2013   Procedure: Extraction of tooth #'s 1,8,9,13,14,15,23,24,25,26 with alveoloplasty and gross debridement of remaining teeth;  Surgeon: Lenn Cal, DDS;  Location: [place];  Service: Oral Surgery;  Laterality: N/A;  . TEE WITHOUT CARDIOVERSION N/A 03/18/2013   Procedure: TRANSESOPHAGEAL ECHOCARDIOGRAM (TEE);  Surgeon: Larey Dresser, MD;  Location: [place];  Service: Cardiovascular;  Laterality: N/A;  . TEE WITHOUT CARDIOVERSION N/A 06/17/2013   Procedure: TRANSESOPHAGEAL ECHOCARDIOGRAM (TEE);  Surgeon: Lelon Perla, MD;  Location: [facility] ENDOSCOPY;   Service: Cardiovascular;  Laterality: N/A;  . TUBAL LIGATION  1999   Family History  Problem Relation Age of Onset  . Cancer Mother   . Stroke Father   . Sarcoidosis Sister   . Heart attack Neg Hx    Social History   Tobacco Use  . Smoking status: Former Smoker    Packs/day: 0.50    Years: 36.00    Pack years: 18.00    Types: Cigarettes    Last attempt to quit: 03/15/2013    Years since quitting: 5.7  . Smokeless tobacco: Never Used  Substance Use Topics  . Alcohol use: Yes    Comment: 01/11/2017  "glass of wine/month"  . Drug use: No   Allergies  Allergen Reactions  . Oxycodone Itching     Review of Systems  Constitutional: Negative for chills, diaphoresis, fatigue and fever.  HENT: Negative for nosebleeds and trouble swallowing.   Eyes: Negative for photophobia and visual disturbance.  Respiratory: Negative for cough and shortness of breath.   Cardiovascular: Negative for chest pain, palpitations and leg swelling.  Gastrointestinal: Negative for abdominal pain, blood in stool, constipation, diarrhea and nausea.  Endocrine: Negative for cold intolerance, heat intolerance, polydipsia, polyphagia and polyuria.  Genitourinary: Negative for dysuria and frequency.  Musculoskeletal: Negative for arthralgias, back pain and gait problem.  Neurological: Negative for dizziness and headaches.  Hematological: Negative for adenopathy. Does not bruise/bleed easily.       Objective:   Physical Exam Vitals signs and nursing note reviewed.  Constitutional:      Appearance: Normal appearance. She is normal weight.  HENT:     Head: Normocephalic and atraumatic.     Right Ear: Tympanic membrane, ear canal and external ear normal.     Left Ear: Tympanic membrane, ear canal and external ear normal.     Nose: Nose normal. No rhinorrhea.     Mouth/Throat:     Mouth: Mucous membranes are moist.     Pharynx: Oropharynx is clear.  Eyes:     Extraocular Movements: Extraocular  movements intact.     Conjunctiva/sclera: Conjunctivae normal.  Neck:     Musculoskeletal: Normal range of motion and neck supple.     Vascular: No carotid bruit.  Cardiovascular:     Rate and Rhythm: Normal rate and regular rhythm.  Pulmonary:     Effort: Pulmonary effort is normal.     Breath sounds: Normal breath sounds.  Abdominal:     General: Bowel sounds are normal.     Palpations: Abdomen is soft.     Tenderness: There is no abdominal tenderness. There is no right CVA tenderness, left CVA tenderness, guarding or rebound.  Musculoskeletal: Normal range of motion.        General: No swelling or tenderness.     Right lower leg: No edema.     Left lower leg: No edema.  Lymphadenopathy:     Cervical: No cervical adenopathy.  Neurological:     General: No focal deficit present.     Mental Status: She is alert and oriented to person, place, and time.  Psychiatric:        Mood and Affect: Mood normal.        Behavior: Behavior normal.        Thought Content: Thought content normal.        Judgment: Judgment normal.    BP 133/86 (BP Location: Left Arm, Patient Position: Sitting, Cuff Size: Small)   Pulse (!) 59   Temp 98.1 F (36.7 C) (Oral)   Ht 5\' 2"  (1.575 m)   Wt 129 lb (58.5 kg)   LMP 03/22/2013   SpO2 94%   BMI 23.59 kg/m         Assessment & Plan:  1. Essential hypertension Patient's blood pressure is currently near goal of 130/80 without medication but patient will be scheduled for follow-up with her cardiologist as patient with history of heart failure and may benefit from ACE inhibitor and/or beta-blocker therapy.  Patient also with history of SVT but her heart rate is currently normal and patient reports no episodes of tachycardia  2. Stage 3 chronic kidney disease ([place]) Patient has been reported in the past to have stage III chronic kidney disease however on her most recent CMP on 05/25/2018, her creatinine was normal at 0.78.  We will repeat CMP but she  does not appear to have stage III chronic kidney disease based on her most recent labs - Comprehensive metabolic panel  3. Anemia, unspecified type Patient with history of anemia.  Anemia is currently stable based on last CBC done 05/31/2018 at which time hemoglobin was 12.0 however patient  did have macrocytosis.  Patient is also on long-term Coumadin therapy secondary to mechanical mitral valve and is at risk for development of anemia.  CBC will be repeated at today's visit.  B complex multivitamin recommended due to macrocytosis as well as limiting alcohol use - CBC with Differential  4. Elevated liver enzymes Patient with mild increase in alkaline phosphatase on complete metabolic panel done 6/76/1950.  We will recheck at today's visit and if labs remain abnormal, further evaluation such as abdominal ultrasound or other testing may be needed - Comprehensive metabolic panel  5. Long term (current) use of anticoagulants Patient will have CBC in follow-up of long-term use of Coumadin due to mechanical valve - CBC with Differential  6. Chronic heart failure with preserved ejection fraction [facility]) Patient appears to have stable CHF.  Patient's blood pressures controlled and she is having no issues with peripheral edema, fatigue or shortness of breath.  Patient however has had no recent follow-up with a cardiologist since July 2019 therefore referral will be made for patient to follow-up with her cardiologist.  Patient will have CMP done in follow-up of congestive heart failure - Comprehensive metabolic panel - Ambulatory referral to Cardiology  7. Encounter for long-term (current) use of medications Patient with minimal use of medication at this time.  Patient reported past issues with statin intolerance and therefore did not take recommended statins in the past but instead made dietary changes.  Patient is nonfasting at today's visit but her most recent lipid panel did have triglycerides elevated at  300 with goal triglyceride level of 150 or less but patient did have LDL of 61 which was consistent with goal LDL of 70 or less as patient has also had evidence of 1 to 39% carotid stenosis on prior carotid ultrasound and patient with history of CVA though she cannot really recall any details or residual deficits from the prior CVA - Comprehensive metabolic panel  8. 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  On review of patient's cardiology visit from 04/24/2018, patient was being seen due to worsening heart block and patient with recurrent episodes of near syncope.  Patient had evidence of transient AV block with pauses of 4 to 5 seconds on cardiac monitoring.  Cardiologist had recommended pacemaker which patient was to consider and call when she wished to proceed.  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  Patient is encouraged to follow-up with cardiology and new referral placed. - Ambulatory referral to Cardiology  An After Visit Summary was printed and given to the patient.  Allergies as of 11/20/2018      Reactions   Oxycodone Itching      Medication List       Accurate as of November 20, 2018 11:59 PM. Always use your most recent med list.        aspirin EC 81 MG tablet Take 1 tablet (81 mg total) by mouth daily.   CLEAR EYES COMPLETE OP Place 2 drops into both eyes daily as needed (red eyes).   warfarin 5 MG tablet Commonly known as:  COUMADIN Take as directed by the anticoagulation clinic. If you are unsure how to take this medication, talk to your nurse or doctor. Original instructions:  Take  as directed by coumadin clinic   [facility] HAZEL EX Apply 1 application topically at bedtime as needed (knee pain, bruise on  arm).      Return in about 4 months (around 03/21/2019) for chronic issues.

## 2018-11-21 ENCOUNTER — Ambulatory Visit (INDEPENDENT_AMBULATORY_CARE_PROVIDER_SITE_OTHER): Payer: Self-pay | Admitting: Pharmacist

## 2018-11-21 DIAGNOSIS — Z7901 Long term (current) use of anticoagulants: Secondary | ICD-10-CM

## 2018-11-21 DIAGNOSIS — I059 Rheumatic mitral valve disease, unspecified: Secondary | ICD-10-CM

## 2018-11-21 LAB — CBC WITH DIFFERENTIAL/PLATELET
Basophils Absolute: 0 x10E3/uL (ref 0.0–0.2)
Basos: 1 %
EOS (ABSOLUTE): 0.4 x10E3/uL (ref 0.0–0.4)
Eos: 6 %
Hematocrit: 33.7 % — ABNORMAL LOW (ref 34.0–46.6)
Hemoglobin: 11.7 g/dL (ref 11.1–15.9)
Immature Grans (Abs): 0 x10E3/uL (ref 0.0–0.1)
Immature Granulocytes: 1 %
Lymphocytes Absolute: 1.6 x10E3/uL (ref 0.7–3.1)
Lymphs: 24 %
MCH: 36.6 pg — ABNORMAL HIGH (ref 26.6–33.0)
MCHC: 34.7 g/dL (ref 31.5–35.7)
MCV: 105 fL — ABNORMAL HIGH (ref 79–97)
Monocytes Absolute: 0.3 x10E3/uL (ref 0.1–0.9)
Monocytes: 5 %
Neutrophils Absolute: 4 x10E3/uL (ref 1.4–7.0)
Neutrophils: 63 %
Platelets: 411 x10E3/uL (ref 150–450)
RBC: 3.2 x10E6/uL — ABNORMAL LOW (ref 3.77–5.28)
RDW: 13.4 % (ref 11.7–15.4)
WBC: 6.4 x10E3/uL (ref 3.4–10.8)

## 2018-11-21 LAB — COMPREHENSIVE METABOLIC PANEL WITH GFR
ALT: 13 IU/L (ref 0–32)
AST: 33 IU/L (ref 0–40)
Albumin/Globulin Ratio: 1.4 (ref 1.2–2.2)
Albumin: 3.4 g/dL — ABNORMAL LOW (ref 3.8–4.9)
Alkaline Phosphatase: 139 IU/L — ABNORMAL HIGH (ref 39–117)
BUN/Creatinine Ratio: 12 (ref 9–23)
BUN: 8 mg/dL (ref 6–24)
Bilirubin Total: <0.2 mg/dL (ref 0.0–1.2)
CO2: 20 mmol/L (ref 20–29)
Calcium: 8.7 mg/dL (ref 8.7–10.2)
Chloride: 105 mmol/L (ref 96–106)
Creatinine, Ser: 0.68 mg/dL (ref 0.57–1.00)
GFR calc Af Amer: 113 mL/min/1.73
GFR calc non Af Amer: 98 mL/min/1.73
Globulin, Total: 2.5 g/dL (ref 1.5–4.5)
Glucose: 71 mg/dL (ref 65–99)
Potassium: 5.6 mmol/L — ABNORMAL HIGH (ref 3.5–5.2)
Sodium: 135 mmol/L (ref 134–144)
Total Protein: 5.9 g/dL — ABNORMAL LOW (ref 6.0–8.5)

## 2018-11-21 LAB — POCT INR: INR: 3.3 — AB (ref 2.0–3.0)

## 2018-11-21 NOTE — Patient Instructions (Signed)
Description   Continue the dose you have been taking - 1 tablet everyday except 1.5 tablet on Mondays, Wednesdays and Fridays.  Recheck in 3 weeks. Continue eating the same amount of leafy green vegetables.  Main # [phone]

## 2018-11-22 ENCOUNTER — Other Ambulatory Visit: Payer: Self-pay | Admitting: Family Medicine

## 2018-11-22 DIAGNOSIS — E875 Hyperkalemia: Secondary | ICD-10-CM

## 2018-11-22 NOTE — Progress Notes (Signed)
Patient ID: Mary [name], female   DOB: 15-Jun-1961, 58 y.o.   MRN: [id number]   Patient with elevated potassium of 5.6 on recent blood work.  Patient will be contacted by medical assistant to return this week for repeat potassium level and to avoid potassium rich foods in the diet at this time.

## 2018-11-30 NOTE — Progress Notes (Signed)
Patient needs to be scheduled for a Monday blood draw for potassium. Patient is not anemic but needs an OTC B complex vitamin for cell size being larger than they are suppose to be.

## 2018-12-03 NOTE — Progress Notes (Signed)
Called patient mad an lab appointment for 12/04/2018

## 2018-12-04 ENCOUNTER — Other Ambulatory Visit: Payer: Self-pay

## 2018-12-05 ENCOUNTER — Emergency Department (HOSPITAL_COMMUNITY): Payer: Self-pay

## 2018-12-05 ENCOUNTER — Encounter (HOSPITAL_COMMUNITY): Payer: Self-pay | Admitting: Emergency Medicine

## 2018-12-05 ENCOUNTER — Inpatient Hospital Stay (HOSPITAL_COMMUNITY)
Admission: EM | Admit: 2018-12-05 | Discharge: 2018-12-07 | DRG: 641 | Disposition: A | Discharge status: Home / Self Care | Payer: Self-pay | Attending: Internal Medicine | Admitting: Internal Medicine

## 2018-12-05 ENCOUNTER — Other Ambulatory Visit: Payer: Self-pay

## 2018-12-05 DIAGNOSIS — Z87891 Personal history of nicotine dependence: Secondary | ICD-10-CM

## 2018-12-05 DIAGNOSIS — Z9851 Tubal ligation status: Secondary | ICD-10-CM

## 2018-12-05 DIAGNOSIS — I5032 Chronic diastolic (congestive) heart failure: Secondary | ICD-10-CM | POA: Diagnosis present

## 2018-12-05 DIAGNOSIS — Z8673 Personal history of transient ischemic attack (TIA), and cerebral infarction without residual deficits: Secondary | ICD-10-CM

## 2018-12-05 DIAGNOSIS — E860 Dehydration: Principal | ICD-10-CM | POA: Diagnosis present

## 2018-12-05 DIAGNOSIS — R040 Epistaxis: Secondary | ICD-10-CM | POA: Diagnosis present

## 2018-12-05 DIAGNOSIS — R011 Cardiac murmur, unspecified: Secondary | ICD-10-CM | POA: Diagnosis present

## 2018-12-05 DIAGNOSIS — K219 Gastro-esophageal reflux disease without esophagitis: Secondary | ICD-10-CM | POA: Diagnosis present

## 2018-12-05 DIAGNOSIS — N179 Acute kidney failure, unspecified: Secondary | ICD-10-CM | POA: Diagnosis present

## 2018-12-05 DIAGNOSIS — J45909 Unspecified asthma, uncomplicated: Secondary | ICD-10-CM | POA: Diagnosis present

## 2018-12-05 DIAGNOSIS — I471 Supraventricular tachycardia: Secondary | ICD-10-CM | POA: Diagnosis present

## 2018-12-05 DIAGNOSIS — E861 Hypovolemia: Secondary | ICD-10-CM | POA: Diagnosis present

## 2018-12-05 DIAGNOSIS — Z952 Presence of prosthetic heart valve: Secondary | ICD-10-CM

## 2018-12-05 DIAGNOSIS — Z823 Family history of stroke: Secondary | ICD-10-CM

## 2018-12-05 DIAGNOSIS — I951 Orthostatic hypotension: Secondary | ICD-10-CM | POA: Diagnosis present

## 2018-12-05 DIAGNOSIS — I6529 Occlusion and stenosis of unspecified carotid artery: Secondary | ICD-10-CM | POA: Diagnosis present

## 2018-12-05 DIAGNOSIS — Z7982 Long term (current) use of aspirin: Secondary | ICD-10-CM

## 2018-12-05 DIAGNOSIS — K439 Ventral hernia without obstruction or gangrene: Secondary | ICD-10-CM | POA: Diagnosis present

## 2018-12-05 DIAGNOSIS — K529 Noninfective gastroenteritis and colitis, unspecified: Secondary | ICD-10-CM | POA: Diagnosis present

## 2018-12-05 DIAGNOSIS — E871 Hypo-osmolality and hyponatremia: Secondary | ICD-10-CM | POA: Diagnosis present

## 2018-12-05 DIAGNOSIS — I110 Hypertensive heart disease with heart failure: Secondary | ICD-10-CM | POA: Diagnosis present

## 2018-12-05 DIAGNOSIS — Z809 Family history of malignant neoplasm, unspecified: Secondary | ICD-10-CM

## 2018-12-05 DIAGNOSIS — Z7901 Long term (current) use of anticoagulants: Secondary | ICD-10-CM

## 2018-12-05 LAB — CBC
HCT: 38.1 % (ref 36.0–46.0)
Hemoglobin: 13.3 g/dL (ref 12.0–15.0)
MCH: 35.9 pg — AB (ref 26.0–34.0)
MCHC: 34.9 g/dL (ref 30.0–36.0)
MCV: 103 fL — ABNORMAL HIGH (ref 80.0–100.0)
Platelets: 376 10*3/uL (ref 150–400)
RBC: 3.7 MIL/uL — ABNORMAL LOW (ref 3.87–5.11)
RDW: 13 % (ref 11.5–15.5)
WBC: 8.9 10*3/uL (ref 4.0–10.5)
nRBC: 0 % (ref 0.0–0.2)

## 2018-12-05 LAB — POCT I-STAT EG7
BICARBONATE: 21.1 mmol/L (ref 20.0–28.0)
Calcium, Ion: 0.96 mmol/L — ABNORMAL LOW (ref 1.15–1.40)
HCT: 39 % (ref 36.0–46.0)
Hemoglobin: 13.3 g/dL (ref 12.0–15.0)
O2 Saturation: 95 %
PH VEN: 7.52 — AB (ref 7.250–7.430)
Potassium: 4.3 mmol/L (ref 3.5–5.1)
Sodium: 126 mmol/L — ABNORMAL LOW (ref 135–145)
TCO2: 22 mmol/L (ref 22–32)
pCO2, Ven: 25.9 mmHg — ABNORMAL LOW (ref 44.0–60.0)
pO2, Ven: 67 mmHg — ABNORMAL HIGH (ref 32.0–45.0)

## 2018-12-05 LAB — PROTIME-INR
INR: 3.4 — ABNORMAL HIGH (ref 0.8–1.2)
Prothrombin Time: 33.5 seconds — ABNORMAL HIGH (ref 11.4–15.2)

## 2018-12-05 LAB — COMPREHENSIVE METABOLIC PANEL
ALBUMIN: 3.4 g/dL — AB (ref 3.5–5.0)
ALT: 18 U/L (ref 0–44)
AST: 38 U/L (ref 15–41)
Alkaline Phosphatase: 168 U/L — ABNORMAL HIGH (ref 38–126)
Anion gap: 16 — ABNORMAL HIGH (ref 5–15)
BUN: 16 mg/dL (ref 6–20)
CO2: 19 mmol/L — ABNORMAL LOW (ref 22–32)
Calcium: 8.7 mg/dL — ABNORMAL LOW (ref 8.9–10.3)
Chloride: 92 mmol/L — ABNORMAL LOW (ref 98–111)
Creatinine, Ser: 1.95 mg/dL — ABNORMAL HIGH (ref 0.44–1.00)
GFR calc Af Amer: 33 mL/min — ABNORMAL LOW (ref >60)
GFR calc non Af Amer: 28 mL/min — ABNORMAL LOW (ref >60)
Glucose, Bld: 90 mg/dL (ref 70–99)
Potassium: 4.1 mmol/L (ref 3.5–5.1)
Sodium: 127 mmol/L — ABNORMAL LOW (ref 135–145)
Total Bilirubin: 0.4 mg/dL (ref 0.3–1.2)
Total Protein: 7.2 g/dL (ref 6.5–8.1)

## 2018-12-05 LAB — URINALYSIS, ROUTINE W REFLEX MICROSCOPIC
Bilirubin Urine: NEGATIVE
Glucose, UA: NEGATIVE mg/dL
Ketones, ur: NEGATIVE mg/dL
Nitrite: NEGATIVE
Protein, ur: 100 mg/dL — AB
RBC / HPF: >50 RBC/hpf — ABNORMAL HIGH (ref 0–5)
Specific Gravity, Urine: 1.009 (ref 1.005–1.030)
pH: 5 (ref 5.0–8.0)

## 2018-12-05 LAB — CORTISOL: Cortisol, Plasma: 10 ug/dL

## 2018-12-05 LAB — I-STAT TROPONIN, ED: TROPONIN I, POC: 0.01 ng/mL (ref 0.00–0.08)

## 2018-12-05 LAB — I-STAT CREATININE, ED: CREATININE: 1.9 mg/dL — AB (ref 0.44–1.00)

## 2018-12-05 LAB — LIPASE, BLOOD: Lipase: 27 U/L (ref 11–51)

## 2018-12-05 MED ORDER — ACETAMINOPHEN 650 MG RE SUPP: 650.0000 mg | Freq: Four times a day (QID) | RECTAL | Status: DC | PRN

## 2018-12-05 MED ORDER — FLUTICASONE PROPIONATE 50 MCG/ACT NA SUSP
1.0000 | Freq: Every day | NASAL | Status: DC
  Administered 2018-12-06 – 2018-12-07 (×2): 1 via NASAL
  Filled 2018-12-05: qty 16

## 2018-12-05 MED ORDER — SODIUM CHLORIDE 0.9 % IV SOLN
INTRAVENOUS | Status: DC
  Administered 2018-12-05 – 2018-12-07 (×4): via INTRAVENOUS

## 2018-12-05 MED ORDER — WARFARIN - PHARMACIST DOSING INPATIENT: Freq: Every day | Status: DC

## 2018-12-05 MED ORDER — PROMETHAZINE HCL 25 MG PO TABS: 12.5000 mg | ORAL_TABLET | Freq: Four times a day (QID) | ORAL | Status: DC | PRN

## 2018-12-05 MED ORDER — WARFARIN SODIUM 5 MG PO TABS
5.0000 mg | ORAL_TABLET | Freq: Every day | ORAL | Status: DC
  Administered 2018-12-05: 5 mg via ORAL
  Filled 2018-12-05: qty 1

## 2018-12-05 MED ORDER — POLYETHYLENE GLYCOL 3350 17 G PO PACK: 17.0000 g | PACK | Freq: Every day | ORAL | Status: DC | PRN

## 2018-12-05 MED ORDER — ONDANSETRON HCL 4 MG/2ML IJ SOLN
4.0000 mg | Freq: Once | INTRAMUSCULAR | Status: AC
  Administered 2018-12-05: 4 mg via INTRAVENOUS
  Filled 2018-12-05: qty 2

## 2018-12-05 MED ORDER — ACETAMINOPHEN 325 MG PO TABS: 650.0000 mg | ORAL_TABLET | Freq: Four times a day (QID) | ORAL | Status: DC | PRN

## 2018-12-05 MED ORDER — SODIUM CHLORIDE 0.9 % IV BOLUS
2000.0000 mL | Freq: Once | INTRAVENOUS | Status: AC
  Administered 2018-12-05: 2000 mL via INTRAVENOUS

## 2018-12-05 MED ORDER — OXYMETAZOLINE HCL 0.05 % NA SOLN: 1.0000 | Freq: Two times a day (BID) | NASAL | Status: DC | PRN

## 2018-12-05 MED ORDER — ASPIRIN EC 81 MG PO TBEC
81.0000 mg | DELAYED_RELEASE_TABLET | Freq: Every day | ORAL | Status: DC
  Administered 2018-12-06 – 2018-12-07 (×2): 81 mg via ORAL
  Filled 2018-12-05 (×2): qty 1

## 2018-12-05 MED ORDER — SODIUM CHLORIDE 0.9% FLUSH
3.0000 mL | Freq: Once | INTRAVENOUS | Status: AC
  Administered 2018-12-05: 3 mL via INTRAVENOUS

## 2018-12-05 NOTE — ED Notes (Signed)
Report called to Debra

## 2018-12-05 NOTE — Progress Notes (Signed)
[place] for warfarin Indication: MVR  Allergies  Allergen Reactions  . Oxycodone Itching    Patient Measurements:   Vital Signs: Temp: 97.9 F (36.6 C) (03/11 1152) Temp Source: Oral (03/11 1152) BP: 125/81 (03/11 1732) Pulse Rate: 108 (03/11 1733)  Labs: Recent Labs    12/05/18 1231 12/05/18 1242  HGB 13.3 13.3  HCT 38.1 39.0  PLT 376  --   LABPROT 33.5*  --   INR 3.4*  --   CREATININE 1.95* 1.90*    CrCl cannot be calculated (Unknown ideal weight.).   Medications:  PTA: Warfarin 7.5 mg by mouth every Monday, Wednesday, and Friday.  Take 5 mg by mouth once daily on Tuesday, Thursday, Saturday, and Sunday.  Assessment: 58 y/o F with a h/o CVA and metallic MVR on chronic warfarin presenting to the ed with vomiting, diarrhea, and hypotension. Patient has been having some epistaxis per  MD and INR is therapeutic although at higher end of therapeutic range.   Goal of Therapy:  INR 2.5-3.5   Plan:  MD ordered 5 mg daily for now. Agree with slightly lower dose 2/2 epistaxis, INR at high end of goal range, and acute illness.  Daily INR.  Monitor for bleeding.   Ulice Dash D 12/05/2018,5:51 PM

## 2018-12-05 NOTE — ED Notes (Signed)
ED TO INPATIENT HANDOFF REPORT  ED Nurse Name and Phone #: [phone]/ [place]  S Name/Age/Gender Mary [name] 58 y.o. female Room/Bed: WA02/WA02  Code Status   Code Status: Full Code  Home/SNF/Other Home Patient oriented to: self, place, time and situation Is this baseline? Yes   Triage Complete: Triage complete  Chief Complaint emesis  Triage Note Pt c/o nausea, vomiting and diarrhea x 3 days with poor po intake. Pt states she has had dizziness that worsened today and felt like she was going to pass out.    Allergies Allergies  Allergen Reactions  . Oxycodone Itching    Level of Care/Admitting Diagnosis ED Disposition    ED Disposition Condition Comment   Admit  Hospital Area: [facility] [[id number]]  Level of Care: Med-Surg [16]  Diagnosis: Gastroenteritis [010272]  Admitting Physician: Guilford Shi [[id number]]  Attending Physician: Guilford Shi [[id number]]  PT Class (Do Not Modify): Observation [104]  PT Acc Code (Do Not Modify): Observation [10022]       B Medical/Surgery History Past Medical History:  Diagnosis Date  . Acute diastolic heart failure ([place]) 03/15/2013  . Anemia   . Carotid stenosis    Carotid US 2/17: bilateral ICA 1-39% >> FU prn  . Childhood asthma   . Complete heart block ([place]) 05/28/2013   Post-op  . Epigastric hernia 200's  . GERD (gastroesophageal reflux disease)   . Heart murmur   . History of blood transfusion    "14 w/1st pregnancy; 2 w/last C-section" (03/15/2013)  . Hx of echocardiogram    a. Echo 4/16:  Mild focal basal septal hypertrophy, EF 60-65%, no RWMA, Mechanical MVR ok with mild central regurgitation and no perivalvular leak, small mobile density attached to valvular ring (1.5x1 cm) - post surgical changes vs SBE, mild LAE;   b. Echo 2/17:  Mild post wall LVH, EF 55-60%, no RWMA, mechanical MVR ok (mean 5 mmHg), normal RVSF  . Hypertension    no pcp   will [facility] to [facility]  . Rheumatic mitral  stenosis with regurgitation 03/23/2013  . S/P mitral valve replacement with metallic valve 5/63/8756   86mm Sorin Carbomedics mechanical prosthesis via right mini thoracotomy approach  . 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 Severe mitral regurgitation 04/22/2013  . Stroke ([place])   . SVT (supraventricular tachycardia) ([place]) 03/16/2013  . Tricuspid regurgitation    Past Surgical History:  Procedure Laterality Date  . APPENDECTOMY  [id number]  . CARDIAC CATHETERIZATION    . CARDIAC VALVE REPLACEMENT    . CESAREAN SECTION  1983  . CESAREAN SECTION WITH BILATERAL TUBAL LIGATION  1999  . FEMORAL HERNIA REPAIR Right 05/23/2013   Procedure: HERNIA REPAIR FEMORAL;  Surgeon: Rexene Alberts, MD;  Location: [place];  Service: Open Heart Surgery;  Laterality: Right;  . INTRAOPERATIVE TRANSESOPHAGEAL ECHOCARDIOGRAM N/A 05/23/2013   Procedure: INTRAOPERATIVE TRANSESOPHAGEAL ECHOCARDIOGRAM;  Surgeon: Rexene Alberts, MD;  Location: [place];  Service: Open Heart Surgery;  Laterality: N/A;  . LAPAROSCOPIC CHOLECYSTECTOMY  2003  . LEFT AND RIGHT HEART CATHETERIZATION WITH CORONARY ANGIOGRAM N/A 03/22/2013   Procedure: LEFT AND RIGHT HEART CATHETERIZATION WITH CORONARY ANGIOGRAM;  Surgeon: Burnell Blanks, MD;  Location[facility] C[facility] LAB;  Service: Cardiovascular;  Laterality: N/A;  . MINIMALLY INVASIVE TRICUSPID VALVE REPAIR Right 05/23/2013   Procedure: MINIMALLY INVASIVE TRICUSPID VALVE REPAIR;  Surgeon: Rexene Alberts, MD;  Location: [place];  Service: Open Heart Surgery;  Laterality: Right;  . MITRAL VALVE REPLACEMENT N/A 05/23/2013   Procedure: MITRAL VALVE (MV) REPLACEMENT;  Surgeon: Rexene Alberts, MD;  Location: [place];  Service: Open Heart Surgery;  Laterality: N/A;  . MULTIPLE EXTRACTIONS WITH ALVEOLOPLASTY N/A 04/04/2013   Procedure:  Extraction of tooth #'s 1,8,9,13,14,15,23,24,25,26 with alveoloplasty and gross debridement of remaining teeth;  Surgeon: Lenn Cal, DDS;  Location: [place];  Service: Oral Surgery;  Laterality: N/A;  . TEE WITHOUT CARDIOVERSION N/A 03/18/2013   Procedure: TRANSESOPHAGEAL ECHOCARDIOGRAM (TEE);  Surgeon: Larey Dresser, MD;  Location: [place];  Service: Cardiovascular;  Laterality: N/A;  . TEE WITHOUT CARDIOVERSION N/A 06/17/2013   Procedure: TRANSESOPHAGEAL ECHOCARDIOGRAM (TEE);  Surgeon: Lelon Perla, MD;  Location: [place];  Service: Cardiovascular;  Laterality: N/A;  . [place]     A IV Location/Drains/Wounds Patient Lines/Drains/Airways Status   Active Line/Drains/Airways    Name:   Placement date:   Placement time:   Site:   Days:   Peripheral IV 12/05/18 Right Arm   12/05/18    1229    Arm   less than 1          Intake/Output Last 24 hours No intake or output data in the 24 hours ending 12/05/18 1735  Labs/Imaging Results for orders placed or performed during the hospital encounter of 12/05/18 (from the past 48 hour(s))  Lipase, blood     Status: None   Collection Time: 12/05/18 12:31 PM  Result Value Ref Range   Lipase 27 11 - 51 U/L    Comment: Performed at [facility], [place] [address], [place], [place] [address]  Comprehensive metabolic panel     Status: Abnormal   Collection Time: 12/05/18 12:31 PM  Result Value Ref Range   Sodium 127 (L) 135 - 145 mmol/L   Potassium 4.1 3.5 - 5.1 mmol/L   Chloride 92 (L) 98 - 111 mmol/L   CO2 19 (L) 22 - 32 mmol/L   Glucose, Bld 90 70 - 99 mg/dL   BUN 16 6 - 20 mg/dL   Creatinine, Ser 1.95 (H) 0.44 - 1.00 mg/dL   Calcium 8.7 (L) 8.9 - 10.3 mg/dL   Total Protein 7.2 6.5 - 8.1 g/dL   Albumin 3.4 (L) 3.5 - 5.0 g/dL   AST 38 15 - 41 U/L   ALT 18 0 - 44 U/L   Alkaline Phosphatase 168 (H) 38 - 126 U/L   Total Bilirubin 0.4 0.3 - 1.2 mg/dL   GFR calc non Af Amer 28 (L) >60 mL/min    GFR calc Af Amer 33 (L) >60 mL/min   Anion gap 16 (H) 5 - 15    Comment: Performed at [facility], [place] [address], [place], [place] [address]  CBC     Status: Abnormal   Collection Time: 12/05/18 12:31 PM  Result Value Ref Range   WBC 8.9 4.0 - 10.5 K/uL   RBC 3.70 (L) 3.87 - 5.11 MIL/uL   Hemoglobin 13.3 12.0 - 15.0 g/dL   HCT 38.1 36.0 - 46.0 %   MCV 103.0 (H) 80.0 - 100.0 fL   MCH 35.9 (H) 26.0 - 34.0 pg   MCHC 34.9 30.0 - 36.0 g/dL   RDW 13.0 11.5 - 15.5 %   Platelets 376 150 - 400 K/uL   nRBC 0.0 0.0 - 0.2 %  Comment: Performed at [facility], [place] [address], [place], [place] [address]  Protime-INR     Status: Abnormal   Collection Time: 12/05/18 12:31 PM  Result Value Ref Range   Prothrombin Time 33.5 (H) 11.4 - 15.2 seconds   INR 3.4 (H) 0.8 - 1.2    Comment: (NOTE) INR goal varies based on device and disease states. Performed at [facility], [place] [address], [place], [place] [address]   I-stat troponin, ED     Status: None   Collection Time: 12/05/18 12:41 PM  Result Value Ref Range   Troponin i, poc 0.01 0.00 - 0.08 ng/mL   Comment 3            Comment: Due to the release kinetics of cTnI, a negative result within the first hours of the onset of symptoms does not rule out myocardial infarction with certainty. If myocardial infarction is still suspected, repeat the test at appropriate intervals.   I-Stat Creatinine, ED (not at [facility])     Status: Abnormal   Collection Time: 12/05/18 12:42 PM  Result Value Ref Range   Creatinine, Ser 1.90 (H) 0.44 - 1.00 mg/dL  POCT I-Stat EG7     Status: Abnormal   Collection Time: 12/05/18 12:42 PM  Result Value Ref Range   pH, Ven 7.520 (H) 7.250 - 7.430   pCO2, Ven 25.9 (L) 44.0 - 60.0 mmHg   pO2, Ven 67.0 (H) 32.0 - 45.0 mmHg   Bicarbonate 21.1 20.0 - 28.0 mmol/L   TCO2 22 22 - 32 mmol/L   O2 Saturation 95.0 %   Sodium 126 (L) 135 - 145 mmol/L   Potassium 4.3  3.5 - 5.1 mmol/L   Calcium, Ion 0.96 (L) 1.15 - 1.40 mmol/L   HCT 39.0 36.0 - 46.0 %   Hemoglobin 13.3 12.0 - 15.0 g/dL   Patient temperature HIDE    Sample type VENOUS   Urinalysis, Routine w reflex microscopic     Status: Abnormal   Collection Time: 12/05/18  1:37 PM  Result Value Ref Range   Color, Urine YELLOW YELLOW   APPearance CLOUDY (A) CLEAR   Specific Gravity, Urine 1.009 1.005 - 1.030   pH 5.0 5.0 - 8.0   Glucose, UA NEGATIVE NEGATIVE mg/dL   Hgb urine dipstick LARGE (A) NEGATIVE   Bilirubin Urine NEGATIVE NEGATIVE   Ketones, ur NEGATIVE NEGATIVE mg/dL   Protein, ur 100 (A) NEGATIVE mg/dL   Nitrite NEGATIVE NEGATIVE   Leukocytes,Ua SMALL (A) NEGATIVE   RBC / HPF >50 (H) 0 - 5 RBC/hpf   WBC, UA 6-10 0 - 5 WBC/hpf   Bacteria, UA RARE (A) NONE SEEN   Squamous Epithelial / LPF 11-20 0 - 5   Mucus PRESENT    Budding Yeast PRESENT    Hyaline Casts, UA PRESENT     Comment: Performed at [facility], [place] [address] [place],  [address]   Ct Renal Stone Study  Result Date: 12/05/2018 CLINICAL DATA:  Flank pain EXAM: CT ABDOMEN AND PELVIS WITHOUT CONTRAST TECHNIQUE: Multidetector CT imaging of the abdomen and pelvis was performed following the standard protocol without IV contrast. COMPARISON:  CT abdomen pelvis 05/23/2018 FINDINGS: LOWER CHEST: There is no basilar pleural or apical pericardial effusion. HEPATOBILIARY: The hepatic contours and density are normal. There is no intra- or extrahepatic biliary dilatation. Status post cholecystectomy. PANCREAS: The pancreatic parenchymal contours are normal and there is no ductal dilatation. There is no peripancreatic fluid  collection. SPLEEN: Normal. ADRENALS/URINARY TRACT: --Adrenal glands: Normal. --Right kidney/ureter: No hydronephrosis, nephroureterolithiasis, perinephric stranding or solid renal mass. --Left kidney/ureter: No hydronephrosis, nephroureterolithiasis, perinephric stranding or solid renal mass.  --Urinary bladder: Normal for degree of distention STOMACH/BOWEL: --Stomach/Duodenum: There is no hiatal hernia or other gastric abnormality. The duodenal course and caliber are normal. --Small bowel: No dilatation or inflammation. --Colon: No focal abnormality. --Appendix: Normal. VASCULAR/LYMPHATIC: Normal course and caliber of the major abdominal vessels. No abdominal or pelvic lymphadenopathy. REPRODUCTIVE: Normal uterus and ovaries. MUSCULOSKELETAL. No bony spinal canal stenosis or focal osseous abnormality. OTHER: Unchanged small ventral abdominal hernia containing fat. IMPRESSION: No acute abdominopelvic abnormality. Electronically Signed   By: Ulyses Jarred M.D.   On: 12/05/2018 14:18    Pending Labs Unresulted Labs (From admission, onward)    Start     Ordered   12/06/18 2111  Basic metabolic panel  Tomorrow morning,   R     12/05/18 1716   12/05/18 1502  Cortisol  ONCE - STAT,   R     12/05/18 1501          Vitals/Pain Today's Vitals   12/05/18 1430 12/05/18 1730 12/05/18 1732 12/05/18 1733  BP: 111/88 106/75 125/81   Pulse: (!) 107 (!) 106 (!) 109 (!) 108  Resp: 14 20 (!) 22 (!) 26  Temp:      TempSrc:      SpO2: 99% 100% 100% 100%  PainSc:        Isolation Precautions No active isolations  Medications Medications  aspirin EC tablet 81 mg (has no administration in time range)  warfarin (COUMADIN) tablet 5 mg (has no administration in time range)  oxymetazoline (AFRIN) 0.05 % nasal spray 1 spray (has no administration in time range)  acetaminophen (TYLENOL) tablet 650 mg (has no administration in time range)    Or  acetaminophen (TYLENOL) suppository 650 mg (has no administration in time range)  fluticasone (FLONASE) 50 MCG/ACT nasal spray 1 spray (has no administration in time range)  0.9 %  sodium chloride infusion (has no administration in time range)  polyethylene glycol (MIRALAX / GLYCOLAX) packet 17 g (has no administration in time range)  promethazine  (PHENERGAN) tablet 12.5 mg (has no administration in time range)  sodium chloride flush (NS) 0.9 % injection 3 mL (3 mLs Intravenous Given 12/05/18 1234)  sodium chloride 0.9 % bolus 2,000 mL (2,000 mLs Intravenous New Bag/Given 12/05/18 1234)  ondansetron (ZOFRAN) injection 4 mg (4 mg Intravenous Given 12/05/18 1253)    Mobility walks Low fall risk   Focused Assessments Cardiac Assessment Handoff:    Lab Results  Component Value Date   CKTOTAL 61 03/15/2013   CKMB 1.0 03/15/2013   TROPONINI <0.03 11/29/2017   Lab Results  Component Value Date   DDIMER 0.55 (H) 12/19/2016   Does the Patient currently have chest pain? No     R Recommendations: See Admitting Provider Note  Report given to:   Additional Notes: none

## 2018-12-05 NOTE — Plan of Care (Signed)
Plan of care discussed.   

## 2018-12-05 NOTE — ED Provider Notes (Signed)
[facility] DEPT Provider Note   CSN: [id number] Arrival date & time: 12/05/18  1143    History   Chief Complaint Chief Complaint  Patient presents with  . Emesis  . Diarrhea  . Dizziness    HPI Mary [name] is a 58 y.o. female history of rheumatic fever with mitral valve replacement on Coumadin, reflux, here presenting with vomiting, diarrhea, hypotension.  Patient states that for the last 3 days, she been having some vomiting and diarrhea.  She has poor appetite as well.  Her last vomiting was this morning.  She states that every time she stands up she feels lightheaded dizzy like she is going to pass out.  Denies any blood in her vomit or in her stool.  Denies any melena in her stool.  Patient states that she is compliant with her Coumadin.  Denies any fevers or chills or eating uncooked meat or recent travel.     The history is provided by the patient.    Past Medical History:  Diagnosis Date  . Acute diastolic heart failure ([place]) 03/15/2013  . Anemia   . Carotid stenosis    Carotid US 2/17: bilateral ICA 1-39% >> FU prn  . Childhood asthma   . Complete heart block ([place]) 05/28/2013   Post-op  . Epigastric hernia 200's  . GERD (gastroesophageal reflux disease)   . Heart murmur   . History of blood transfusion    "14 w/1st pregnancy; 2 w/last C-section" (03/15/2013)  . Hx of echocardiogram    a. Echo 4/16:  Mild focal basal septal hypertrophy, EF 60-65%, no RWMA, Mechanical MVR ok with mild central regurgitation and no perivalvular leak, small mobile density attached to valvular ring (1.5x1 cm) - post surgical changes vs SBE, mild LAE;   b. Echo 2/17:  Mild post wall LVH, EF 55-60%, no RWMA, mechanical MVR ok (mean 5 mmHg), normal RVSF  . Hypertension    no pcp   will go to [facility]  . 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 S/P mitral valve replacement with metallic valve 3/87/5643   12mm Sorin Carbomedics mechanical  prosthesis via right mini thoracotomy approach  . 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 Severe mitral regurgitation 04/22/2013  . Stroke ([place])   . SVT (supraventricular tachycardia) ([place]) 03/16/2013  . Tricuspid regurgitation     Patient Active Problem List   Diagnosis Date Noted  . Volume depletion 05/24/2018  . Acute kidney injury ([place]) 05/23/2018  . Gastroenteritis 05/23/2018  . Hyponatremia 04/18/2017  . Orthostatic hypotension 01/11/2017  . Chronic hyponatremia 01/11/2017  . Elevated liver enzymes 01/11/2017  . Atypical chest pain 06/05/2016  . Palpitations 03/20/2016  . Sinus tachycardia 02/01/2016  . AKI (acute kidney injury) ([place]) 01/10/2016  . Subtherapeutic international normalized ratio (INR) 01/10/2016  . TIA (transient ischemic attack) 09/13/2015  . History of rheumatic heart disease 09/13/2015  . CVA (cerebral vascular accident) ([place]) 09/12/2015  . SOB (shortness of breath) 02/24/2014  . First degree heart block 06/17/2013  . Weakness 06/16/2013  . (HFpEF) heart failure with preserved ejection fraction ([place]) 06/16/2013  . Anemia 06/16/2013  . History of bacterial endocarditis 06/16/2013  . Heart valve replaced by other means 06/10/2013  . Long term (current) use of anticoagulants 06/10/2013  . History of complete heart block 05/28/2013  . H/O heart valve  replacement with mechanical valve 05/23/2013  . S/P minimally-invasive tricuspid valve repair 05/23/2013  . Femoral hernia 05/23/2013  . Mitral valve insufficiency 04/22/2013  . Mitral valve disorders(424.0) 04/22/2013  . Tricuspid regurgitation 04/22/2013  . 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 SVT (supraventricular tachycardia) ([place]) 03/16/2013    Past Surgical History:  Procedure Laterality Date  .  APPENDECTOMY  [id number]  . CARDIAC CATHETERIZATION    . CARDIAC VALVE REPLACEMENT    . CESAREAN SECTION  1983  . CESAREAN SECTION WITH BILATERAL TUBAL LIGATION  1999  . FEMORAL HERNIA REPAIR Right 05/23/2013   Procedure: HERNIA REPAIR FEMORAL;  Surgeon: Rexene Alberts, MD;  Location: [facility];  Service: Open Heart Surgery;  Laterality: Right;  . INTRAOPERATIVE TRANSESOPHAGEAL ECHOCARDIOGRAM N/A 05/23/2013   Procedure: INTRAOPERATIVE TRANSESOPHAGEAL ECHOCARDIOGRAM;  Surgeon: Rexene Alberts, MD;  Location: [facility];  Service: Open Heart Surgery;  Laterality: N/A;  . LAPAROSCOPIC CHOLECYSTECTOMY  2003  . LEFT AND RIGHT HEART CATHETERIZATION WITH CORONARY ANGIOGRAM N/A 03/22/2013   Procedure: LEFT AND RIGHT HEART CATHETERIZATION WITH CORONARY ANGIOGRAM;  Surgeon: Burnell Blanks, MD;  Location[facility] CATH LAB;  Service: Cardiovascular;  Laterality: N/A;  . MINIMALLY INVASIVE TRICUSPID VALVE REPAIR Right 05/23/2013   Procedure: MINIMALLY INVASIVE TRICUSPID VALVE REPAIR;  Surgeon: Rexene Alberts, MD;  Location: [facility];  Service: Open Heart Surgery;  Laterality: Right;  . MITRAL VALVE REPLACEMENT N/A 05/23/2013   Procedure: MITRAL VALVE (MV) REPLACEMENT;  Surgeon: Rexene Alberts, MD;  Location: [facility];  Service: Open Heart Surgery;  Laterality: N/A;  . MULTIPLE EXTRACTIONS WITH ALVEOLOPLASTY N/A 04/04/2013   Procedure: Extraction of tooth #'s 1,8,9,13,14,15,23,24,25,26 with alveoloplasty and gross debridement of remaining teeth;  Surgeon: Lenn Cal, DDS;  Location: [place];  Service: Oral Surgery;  Laterality: N/A;  . TEE WITHOUT CARDIOVERSION N/A 03/18/2013   Procedure: TRANSESOPHAGEAL ECHOCARDIOGRAM (TEE);  Surgeon: Larey Dresser, MD;  Location: [place];  Service: Cardiovascular;  Laterality: N/A;  . TEE WITHOUT CARDIOVERSION N/A 06/17/2013   Procedure: TRANSESOPHAGEAL ECHOCARDIOGRAM (TEE);  Surgeon: Lelon Perla, MD;  Location: [facility] ENDOSCOPY;  Service: Cardiovascular;  Laterality: N/A;  . TUBAL  LIGATION  1999     OB History   No obstetric history on file.      Home Medications    Prior to Admission medications   Medication Sig Start Date End Date Taking? Authorizing Provider  aspirin EC 81 MG tablet Take 1 tablet (81 mg total) by mouth daily. 08/26/13   Larey Dresser, MD  Hyprom-Naphaz-Polysorb-Zn Sulf (CLEAR EYES COMPLETE OP) Place 2 drops into both eyes daily as needed (red eyes).    [provider]  warfarin (COUMADIN) 5 MG tablet Take as directed by coumadin clinic 10/30/18   Evans Lance, MD  [facility] HAZEL EX Apply 1 application topically at bedtime as needed (knee pain, bruise on arm).     [provider]    Family History Family History  Problem Relation Age of Onset  . Cancer Mother   . Stroke Father   . Sarcoidosis Sister   . Heart attack Neg Hx     Social History Social History   Tobacco Use  . Smoking status: Former Smoker    Packs/day: 0.50    Years: 36.00    Pack years: 18.00    Types: Cigarettes    Last attempt to quit: 03/15/2013    Years since quitting: 5.7  . Smokeless tobacco: Never Used  Substance Use  Topics  . Alcohol use: Yes    Comment: 01/11/2017  "glass of wine/month"  . Drug use: No     Allergies   Oxycodone   Review of Systems Review of Systems  Gastrointestinal: Positive for diarrhea and vomiting.  Neurological: Positive for dizziness.  All other systems reviewed and are negative.    Physical Exam Updated Vital Signs BP (!) 88/74 (BP Location: Right Arm)   Pulse (!) 114   Temp 97.9 F (36.6 C) (Oral)   Resp 20   LMP 03/22/2013   SpO2 100%   Physical Exam Vitals signs and nursing note reviewed.  HENT:     Head: Normocephalic.     Nose: Nose normal.     Mouth/Throat:     Mouth: Mucous membranes are dry.     Comments: MM dry  Eyes:     Extraocular Movements: Extraocular movements intact.     Pupils: Pupils are equal, round, and reactive to light.  Neck:     Musculoskeletal: Normal range  of motion.  Cardiovascular:     Rate and Rhythm: Normal rate and regular rhythm.  Pulmonary:     Effort: Pulmonary effort is normal.     Breath sounds: Normal breath sounds.  Abdominal:     General: Abdomen is flat.     Palpations: Abdomen is soft.     Comments: Mild diffuse lower abdominal tenderness   Musculoskeletal: Normal range of motion.  Skin:    General: Skin is warm.     Capillary Refill: Capillary refill takes less than 2 seconds.  Neurological:     General: No focal deficit present.     Mental Status: She is alert and oriented to person, place, and time. Mental status is at baseline.  Psychiatric:        Mood and Affect: Mood normal.        Behavior: Behavior normal.      ED Treatments / Results  Labs (all labs ordered are listed, but only abnormal results are displayed) Labs Reviewed  CBC - Abnormal; Notable for the following components:      Result Value   RBC 3.70 (*)    MCV 103.0 (*)    MCH 35.9 (*)    All other components within normal limits  I-STAT CREATININE, ED - Abnormal; Notable for the following components:   Creatinine, Ser 1.90 (*)    All other components within normal limits  POCT I-STAT EG7 - Abnormal; Notable for the following components:   pH, Ven 7.520 (*)    pCO2, Ven 25.9 (*)    pO2, Ven 67.0 (*)    Sodium 126 (*)    Calcium, Ion 0.96 (*)    All other components within normal limits  LIPASE, BLOOD  COMPREHENSIVE METABOLIC PANEL  URINALYSIS, ROUTINE W REFLEX MICROSCOPIC  PROTIME-INR  I-STAT TROPONIN, ED    EKG EKG Interpretation  Date/Time:  Wednesday December 05 2018 12:39:47 EDT Ventricular Rate:  111 PR Interval:    QRS Duration: 106 QT Interval:  360 QTC Calculation: 490 R Axis:   8 Text Interpretation:  Sinus tachycardia Probable left atrial enlargement Low voltage, precordial leads Abnormal R-wave progression, early transition Borderline prolonged QT interval No significant change since last tracing Confirmed by Wandra Arthurs [phone]) on 12/05/2018 12:42:20 PM   Radiology No results found.  Procedures Procedures (including critical care time)  Medications Ordered in ED Medications  sodium chloride flush (NS) 0.9 % injection 3 mL (3 mLs Intravenous Given  12/05/18 1234)  sodium chloride 0.9 % bolus 2,000 mL (2,000 mLs Intravenous New Bag/Given 12/05/18 1234)  ondansetron (ZOFRAN) injection 4 mg (4 mg Intravenous Given 12/05/18 1253)     Initial Impression / Assessment and Plan / ED Course  I have reviewed the triage vital signs and the nursing notes.  Pertinent labs & imaging results that were available during my care of the patient were reviewed by me and considered in my medical decision making (see chart for details).       Rillie NALA KACHEL is a 58 y.o. female here with vomiting, diarrhea. BP 88/70 on arrival, tachycardic. I think likely dehydration from gastroenteritis. Will get labs, UA, CT ab/pel to r/o RP bleed (she is on coumadin). Will hydrate and reassess.   2:51 PM BP improved to 111/88 after 2 L NS bolus. UA + blood. Patient has AKI with Cr 1.9. Sodium is 127. Hg stable. INR 3.4.  No hydro or RP bleed on CT. Given that she has AKI with AG of 16 and metabolic alkalosis, will admit for observation.    Final Clinical Impressions(s) / ED Diagnoses   Final diagnoses:  None    ED Discharge Orders    None       Drenda Freeze, MD 12/05/18 1458

## 2018-12-05 NOTE — ED Triage Notes (Signed)
Pt c/o nausea, vomiting and diarrhea x 3 days with poor po intake. Pt states she has had dizziness that worsened today and felt like she was going to pass out.

## 2018-12-05 NOTE — ED Notes (Signed)
Bed: WA02 Expected date:  Expected time:  Means of arrival:  Comments: EMS-diarrhea 

## 2018-12-05 NOTE — H&P (Addendum)
History and Physical    DOA: 12/05/2018  PCP: Antony Blackbird, MD  Patient coming from: Home  Chief Complaint: Nausea vomiting and diarrhea for 2 days  HPI: Mary [name] is a 58 y.o. female with history h/o valvular heart disease status post mechanical mitral valve replacement on chronic anticoagulation with Coumadin, diastolic CHF, GERD, asthma, SVT, CVA, carotid artery stenosis presents with complaints of nausea vomiting and diarrhea that started 3 days back.  Patient does not recollect any precipitating factors including unusual food intake, sick contacts or new medications.  Patient was feeling somewhat lightheaded yesterday evening but got worse this morning which brought her to the hospital.  Upon arrival in the ED patient was noted to be hypotensive with systolic blood pressure in the 80s, tachycardic with heart rate in the 110s and labs revealed hyponatremia/AKI.  Blood pressure improved to 110/88 with 1 L of IV fluid bolus and patient feels better.  She is requested to be admitted for further evaluation and continued IV hydration.    Review of systems negative except for report of epistaxis over the last 2 days, mostly when she blows her nose.  INR in ED today therapeutic at 3.4.  Patient also takes aspirin for history of peripheral vascular disease/?CVA. She denies any abdominal pain, hematemesis, melena or hematochezia. Other 10 point review of systems negative.    Past Medical History:  Diagnosis Date  . Acute diastolic heart failure ([place]) 03/15/2013  . Anemia   . Carotid stenosis    Carotid US 2/17: bilateral ICA 1-39% >> FU prn  . Childhood asthma   . Complete heart block ([place]) 05/28/2013   Post-op  . Epigastric hernia 200's  . GERD (gastroesophageal reflux disease)   . Heart murmur   . History of blood transfusion    "14 w/1st pregnancy; 2 w/last C-section" (03/15/2013)  . Hx of echocardiogram    a. Echo 4/16:  Mild focal basal septal hypertrophy, EF 60-65%, no RWMA,  Mechanical MVR ok with mild central regurgitation and no perivalvular leak, small mobile density attached to valvular ring (1.5x1 cm) - post surgical changes vs SBE, mild LAE;   b. Echo 2/17:  Mild post wall LVH, EF 55-60%, no RWMA, mechanical MVR ok (mean 5 mmHg), normal RVSF  . Hypertension    no pcp   will go to [facility]  . 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 S/P mitral valve replacement with metallic valve 05/05/1750   27mm Sorin [employer]omed[employer] mechanical prosthesis via right mini thoracotomy approach  . 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   Complex valvuloplasty including Cor-matrix ECM patch augmentation of anterior and lateral leaflets with 42mm Edwards mc3 ring annuloplasty via right mini-thoracotomy approach  . Severe mitral regurgitation 04/22/2013  . Stroke ([place])   . SVT (supraventricular tachycardia) ([place]) 03/16/2013  . Tricuspid regurgitation     Past Surgical History:  Procedure Laterality Date  . APPENDECTOMY  [id number]  . CARDIAC CATHETERIZATION    . CARDIAC VALVE REPLACEMENT    . CESAREAN SECTION  1983  . CESAREAN SECTION WITH BILATERAL TUBAL LIGATION  1999  . FEMORAL HERNIA REPAIR Right 05/23/2013   Procedure: HERNIA REPAIR FEMORAL;  Surgeon: Rexene Alberts, MD;  Location: [place];  Service: Open Heart Surgery;  Laterality: Right;  . INTRAOPERATIVE TRANSESOPHAGEAL ECHOCARDIOGRAM N/A 05/23/2013   Procedure: INTRAOPERATIVE TRANSESOPHAGEAL ECHOCARDIOGRAM;  Surgeon: Rexene Alberts, MD;  Location: [place];  Service: Open Heart Surgery;  Laterality: N/A;  . LAPAROSCOPIC CHOLECYSTECTOMY  2003  .  LEFT AND RIGHT HEART CATHETERIZATION WITH CORONARY ANGIOGRAM N/A 03/22/2013   Procedure: LEFT AND RIGHT HEART CATHETERIZATION WITH CORONARY ANGIOGRAM;  Surgeon: Burnell Blanks, MD;  Location: [facility] CATH LAB;  Service: Cardiovascular;  Laterality: N/A;  . MINIMALLY INVASIVE TRICUSPID VALVE REPAIR Right 05/23/2013   Procedure: MINIMALLY INVASIVE TRICUSPID VALVE REPAIR;  Surgeon:  Rexene Alberts, MD;  Location: [place];  Service: Open Heart Surgery;  Laterality: Right;  . MITRAL VALVE REPLACEMENT N/A 05/23/2013   Procedure: MITRAL VALVE (MV) REPLACEMENT;  Surgeon: Rexene Alberts, MD;  Location: [place];  Service: Open Heart Surgery;  Laterality: N/A;  . MULTIPLE EXTRACTIONS WITH ALVEOLOPLASTY N/A 04/04/2013   Procedure: Extraction of tooth #'s 1,8,9,13,14,15,23,24,25,26 with alveoloplasty and gross debridement of remaining teeth;  Surgeon: Lenn Cal, DDS;  Location: [place];  Service: Oral Surgery;  Laterality: N/A;  . TEE WITHOUT CARDIOVERSION N/A 03/18/2013   Procedure: TRANSESOPHAGEAL ECHOCARDIOGRAM (TEE);  Surgeon: Larey Dresser, MD;  Location: [place];  Service: Cardiovascular;  Laterality: N/A;  . TEE WITHOUT CARDIOVERSION N/A 06/17/2013   Procedure: TRANSESOPHAGEAL ECHOCARDIOGRAM (TEE);  Surgeon: Lelon Perla, MD;  Location: [facility] ENDOSCOPY;  Service: Cardiovascular;  Laterality: N/A;  . TUBAL LIGATION  1999    Social history:  reports that she quit smoking about 5 years ago. Her smoking use included cigarettes. She has a 18.00 pack-year smoking history. She has never used smokeless tobacco. She reports current alcohol use. She reports that she does not use drugs.   Allergies  Allergen Reactions  . Oxycodone Itching    Family History  Problem Relation Age of Onset  . Cancer Mother   . Stroke Father   . Sarcoidosis Sister   . Heart attack Neg Hx       Prior to Admission medications   Medication Sig Start Date End Date Taking? Authorizing Provider  aspirin EC 81 MG tablet Take 1 tablet (81 mg total) by mouth daily. 08/26/13  Yes Larey Dresser, MD  Hyprom-Naphaz-Polysorb-Zn Sulf (CLEAR EYES COMPLETE OP) Place 2 drops into both eyes daily as needed (red eyes).   Yes [provider]  warfarin (COUMADIN) 5 MG tablet Take as directed by coumadin clinic Patient taking differently: Take 5-7.5 mg by mouth daily at 6 PM. Take 7.5 mg by mouth  every Monday, Wednesday, and Friday.  Take 5 mg by mouth once daily on Tuesday, Thursday, Saturday, and Sunday. 10/30/18  Yes Evans Lance, MD  [facility] HAZEL EX Apply 1 application topically at bedtime as needed (knee pain, bruise on arm).    Yes [provider]    Physical Exam: Vitals:   12/05/18 1430 12/05/18 1730 12/05/18 1732 12/05/18 1733  BP: 111/88 106/75 125/81   Pulse: (!) 107 (!) 106 (!) 109 (!) 108  Resp: 14 20 (!) 22 (!) 26  Temp:      TempSrc:      SpO2: 99% 100% 100% 100%    Constitutional: NAD, calm, comfortable Eyes: PERRL, lids and conjunctivae normal ENMT: Mucous membranes are moist. Posterior pharynx clear of any exudate or lesions.Normal dentition.  Neck: normal, supple, no masses, no thyromegaly Respiratory: clear to auscultation bilaterally, no wheezing, no crackles. Normal respiratory effort. No accessory muscle use.  Cardiovascular: Regular rate and rhythm, no murmurs / rubs / gallops. No extremity edema. 2+ pedal pulses. No carotid bruits.  Abdomen: no tenderness, no masses palpated. No hepatosplenomegaly. Bowel sounds positive.  Musculoskeletal: no clubbing / cyanosis. No joint deformity upper and lower  extremities. Good ROM, no contractures. Normal muscle tone.  Neurologic: CN 2-12 grossly intact. Sensation intact, DTR normal. Strength 5/5 in all 4.  Psychiatric: Normal judgment and insight. Alert and oriented x 3. Normal mood.  SKIN/catheters: no rashes, lesions, ulcers. No induration  Labs on Admission: I have personally reviewed following labs and imaging studies  CBC: Recent Labs  Lab 12/05/18 1231 12/05/18 1242  WBC 8.9  --   HGB 13.3 13.3  HCT 38.1 39.0  MCV 103.0*  --   PLT 376  --    Basic Metabolic Panel: Recent Labs  Lab 12/05/18 1231 12/05/18 1242  NA 127* 126*  K 4.1 4.3  CL 92*  --   CO2 19*  --   GLUCOSE 90  --   BUN 16  --   CREATININE 1.95* 1.90*  CALCIUM 8.7*  --    GFR: CrCl cannot be calculated (Unknown  ideal weight.). Liver Function Tests: Recent Labs  Lab 12/05/18 1231  AST 38  ALT 18  ALKPHOS 168*  BILITOT 0.4  PROT 7.2  ALBUMIN 3.4*   Recent Labs  Lab 12/05/18 1231  LIPASE 27   No results for input(s): AMMONIA in the last 168 hours. Coagulation Profile: Recent Labs  Lab 12/05/18 1231  INR 3.4*   Cardiac Enzymes: No results for input(s): CKTOTAL, CKMB, CKMBINDEX, TROPONINI in the last 168 hours. BNP (last 3 results) No results for input(s): PROBNP in the last 8760 hours. HbA1C: No results for input(s): HGBA1C in the last 72 hours. CBG: No results for input(s): GLUCAP in the last 168 hours. Lipid Profile: No results for input(s): CHOL, HDL, LDLCALC, TRIG, CHOLHDL, LDLDIRECT in the last 72 hours. Thyroid Function Tests: No results for input(s): TSH, T4TOTAL, FREET4, T3FREE, THYROIDAB in the last 72 hours. Anemia Panel: No results for input(s): VITAMINB12, FOLATE, FERRITIN, TIBC, IRON, RETICCTPCT in the last 72 hours. Urine analysis:    Component Value Date/Time   COLORURINE YELLOW 12/05/2018 1337   APPEARANCEUR CLOUDY (A) 12/05/2018 1337   LABSPEC 1.009 12/05/2018 1337   PHURINE 5.0 12/05/2018 1337   GLUCOSEU NEGATIVE 12/05/2018 1337   HGBUR LARGE (A) 12/05/2018 1337   BILIRUBINUR NEGATIVE 12/05/2018 1337   KETONESUR NEGATIVE 12/05/2018 1337   PROTEINUR 100 (A) 12/05/2018 1337   UROBILINOGEN 0.2 06/04/2015 1533   NITRITE NEGATIVE 12/05/2018 1337   LEUKOCYTESUR SMALL (A) 12/05/2018 1337    Radiological Exams on Admission: Ct Renal Stone Study  Result Date: 12/05/2018 CLINICAL DATA:  Flank pain EXAM: CT ABDOMEN AND PELVIS WITHOUT CONTRAST TECHNIQUE: Multidetector CT imaging of the abdomen and pelvis was performed following the standard protocol without IV contrast. COMPARISON:  CT abdomen pelvis 05/23/2018 FINDINGS: LOWER CHEST: There is no basilar pleural or apical pericardial effusion. HEPATOBILIARY: The hepatic contours and density are normal. There is no  intra- or extrahepatic biliary dilatation. Status post cholecystectomy. PANCREAS: The pancreatic parenchymal contours are normal and there is no ductal dilatation. There is no peripancreatic fluid collection. SPLEEN: Normal. ADRENALS/URINARY TRACT: --Adrenal glands: Normal. --Right kidney/ureter: No hydronephrosis, nephroureterolithiasis, perinephric stranding or solid renal mass. --Left kidney/ureter: No hydronephrosis, nephroureterolithiasis, perinephric stranding or solid renal mass. --Urinary bladder: Normal for degree of distention STOMACH/BOWEL: --Stomach/Duodenum: There is no hiatal hernia or other gastric abnormality. The duodenal course and caliber are normal. --Small bowel: No dilatation or inflammation. --Colon: No focal abnormality. --Appendix: Normal. VASCULAR/LYMPHATIC: Normal course and caliber of the major abdominal vessels. No abdominal or pelvic lymphadenopathy. REPRODUCTIVE: Normal uterus and ovaries. MUSCULOSKELETAL. No bony spinal  canal stenosis or focal osseous abnormality. OTHER: Unchanged small ventral abdominal hernia containing fat. IMPRESSION: No acute abdominopelvic abnormality. Electronically Signed   By: Ulyses Jarred M.D.   On: 12/05/2018 14:18    EKG: Independently reviewed.  Sinus tachycardia with prolonged QTC at 490 ms     Assessment and Plan:   1.  Acute gastroenteritis: Likely viral etiology.  Patient wants to try liquid diet.  She feels improved in terms of dizziness and nausea after IV fluids and IV antiemetics.  Will admit with symptomatic support and continued hydration.  CT abdomen without contrast negative for any acute intra-abdominal pathology.  Full liquid diet today and advance as tolerated.  2.  AKI: Secondary to problem #1 and dehydration.  Patient's creatinine appears to fluctuate but was normal in February at 0.68.  Currently elevated at 1.95.  Continue IV fluids and labs in a.m.  3.  Hypovolemic hyponatremia: Will monitor response to IV fluids.   Appears acute given normal sodium level in February.  Patient not on any diuretics at home.  4.  Epistaxis: Now resolved.  In the setting of dry nose/aspirin and Coumadin.  INR therapeutic.  Patient advised to pinch her nose if recurs.  Afrin spray as needed.  5.  Severe MR status post mitral valve replacement: Resume Coumadin at lower dose 5 mg daily.  Discussed with pharmacy who will follow along and adjust for goal of 2.5-3.5.  6.  Chronic diastolic CHF: Compensated.  Watch on IV hydration.  Not on diuretics at baseline  7.  Carotid artery stenosis/CVA: Resume aspirin.?  Not on statins.  DVT prophylaxis: On full dose anticoagulation Code Status: Full code  Family Communication: Discussed with patient.  Consults called: Pharmacy for Coumadin dosing Admission status:  Patient admitted as observation as anticipated LOS less than 2 midnights .    Guilford Shi MD [facility] Pager [phone]  If 7PM-7AM, please contact night-coverage [email or web address] Password [facility]  12/05/2018, 6:06 PM

## 2018-12-06 ENCOUNTER — Other Ambulatory Visit: Payer: Self-pay

## 2018-12-06 DIAGNOSIS — E860 Dehydration: Principal | ICD-10-CM

## 2018-12-06 LAB — BASIC METABOLIC PANEL
Anion gap: 8 (ref 5–15)
BUN: 14 mg/dL (ref 6–20)
CO2: 19 mmol/L — AB (ref 22–32)
Calcium: 8.1 mg/dL — ABNORMAL LOW (ref 8.9–10.3)
Chloride: 106 mmol/L (ref 98–111)
Creatinine, Ser: 1.41 mg/dL — ABNORMAL HIGH (ref 0.44–1.00)
GFR calc Af Amer: 48 mL/min — ABNORMAL LOW (ref >60)
GFR calc non Af Amer: 42 mL/min — ABNORMAL LOW (ref >60)
GLUCOSE: 86 mg/dL (ref 70–99)
Potassium: 3.8 mmol/L (ref 3.5–5.1)
Sodium: 133 mmol/L — ABNORMAL LOW (ref 135–145)

## 2018-12-06 LAB — PROTIME-INR
INR: 3.2 — ABNORMAL HIGH (ref 0.8–1.2)
Prothrombin Time: 31.9 seconds — ABNORMAL HIGH (ref 11.4–15.2)

## 2018-12-06 MED ORDER — SACCHAROMYCES BOULARDII 250 MG PO CAPS
250.0000 mg | ORAL_CAPSULE | Freq: Two times a day (BID) | ORAL | Status: DC
  Administered 2018-12-06 – 2018-12-07 (×3): 250 mg via ORAL
  Filled 2018-12-06 (×3): qty 1

## 2018-12-06 MED ORDER — WARFARIN SODIUM 5 MG PO TABS
5.0000 mg | ORAL_TABLET | Freq: Once | ORAL | Status: AC
  Administered 2018-12-06: 5 mg via ORAL
  Filled 2018-12-06: qty 1

## 2018-12-06 NOTE — Progress Notes (Signed)
PROGRESS NOTE    Mary [name]  OIN:[id number] DOB: 12-11-1960 DOA: 12/05/2018 PCP: Antony Blackbird, MD  Brief Narrative: 58 y.o. female with history h/o valvular heart disease status post mechanical mitral valve replacement on chronic anticoagulation with Coumadin, diastolic CHF, GERD, asthma, SVT, CVA, carotid artery stenosis presents with complaints of nausea vomiting and diarrhea that started 3 days back.  Patient does not recollect any precipitating factors including unusual food intake, sick contacts or new medications.  Patient was feeling somewhat lightheaded yesterday evening but got worse this morning which brought her to the hospital.  Upon arrival in the ED patient was noted to be hypotensive with systolic blood pressure in the 80s, tachycardic with heart rate in the 110s and labs revealed hyponatremia/AKI.  Blood pressure improved to 110/88 with 1 L of IV fluid bolus and patient feels better.  She is requested to be admitted for further evaluation and continued IV hydration.    Review of systems negative except for report of epistaxis over the last 2 days, mostly when she blows her nose.  INR in ED today therapeutic at 3.4.  Patient also takes aspirin for history of peripheral vascular disease/?CVA. She denies any abdominal pain, hematemesis, melena or hematochezia. Other 10 point review of systems negative.    Assessment & Plan:   Active Problems:   Gastroenteritis  1.  Acute gastroenteritis: Patient had multiple loose bowel movements overnight.  But has had no vomiting.  Will advance her diet to soft diet and see how she tolerates.  Continue IV fluids.  Patient complains of dizziness upon standing.  Add Florastor.  2.  AKI: Secondary to problem #1 and dehydration.  Patient's creatinine appears to fluctuate but was normal in February at 0.68.  Resolving with IV fluids.   3.  Hypovolemic hyponatremia: We will saline.  Sodium 133 today.  Upon admission sodium was 126.    4.   Epistaxis: Now resolved.  In the setting of dry nose/aspirin and Coumadin.  INR therapeutic.    5.  Severe MR status post mitral valve replacement: Resume Coumadin at lower dose 5 mg daily.  Discussed with pharmacy who will follow along and adjust for goal of 2.5-3.5.  6.  Chronic diastolic CHF: Compensated.  Watch on IV hydration.  Not on diuretics at baseline  7.  Carotid artery stenosis/CVA: Resume aspirin.?  Not on statins.  Patient requires overnight stay for IV hydration and she has not tolerated regular diet yet continues to have multiple loose bowel movements electrolytes need to be followed up for tomorrow. DVT prophylaxis: On full dose anticoagulation with Coumadin Code Status: Full code  Family Communication: Discussed with patient.  Consults called: None   Estimated body mass index is 23.43 kg/m as calculated from the following:   Height as of this encounter: 5\' 2"  (1.575 m).   Weight as of this encounter: 58.1 kg.   Subjective: Patient reports she was in the restroom all night long having watery liquid multiple episodes of diarrhea.  Objective: Vitals:   12/05/18 1839 12/05/18 1930 12/05/18 2141 12/06/18 0558  BP: 125/88  122/77 117/67  Pulse: (!) 107  97 96  Resp: 17  20 17   Temp: 98.3 F (36.8 C)  98.4 F (36.9 C) 98.4 F (36.9 C)  TempSrc: Oral  Oral Oral  SpO2: 98%  100% 99%  Weight:  58.1 kg    Height:  5\' 2"  (1.575 m)      Intake/Output Summary (Last 24 hours) at 12/06/2018  [place] filed at 12/06/2018 0600 Gross per 24 hour  Intake 1577.57 ml  Output -  Net 1577.57 ml   Filed Weights   12/05/18 1930  Weight: 58.1 kg    Examination: Oral mucosa dry General exam: Appears calm and comfortable  Respiratory system: Clear to auscultation. Respiratory effort normal. Cardiovascular system: S1 & S2 heard, RRR. No JVD, murmurs, rubs, gallops or clicks. No pedal edema. Gastrointestinal system: Abdomen is nondistended, soft and nontender. No  organomegaly or masses felt. Normal bowel sounds heard. Central nervous system: Alert and oriented. No focal neurological deficits. Extremities: Symmetric 5 x 5 power. Skin: No rashes, lesions or ulcers Psychiatry: Judgement and insight appear normal. Mood & affect appropriate.     Data Reviewed: I have personally reviewed following labs and imaging studies  CBC: Recent Labs  Lab 12/05/18 1231 12/05/18 1242  WBC 8.9  --   HGB 13.3 13.3  HCT 38.1 39.0  MCV 103.0*  --   PLT 376  --    Basic Metabolic Panel: Recent Labs  Lab 12/05/18 1231 12/05/18 1242 12/06/18 0652  NA 127* 126* 133*  K 4.1 4.3 3.8  CL 92*  --  106  CO2 19*  --  19*  GLUCOSE 90  --  86  BUN 16  --  14  CREATININE 1.95* 1.90* 1.41*  CALCIUM 8.7*  --  8.1*   GFR: Estimated Creatinine Clearance: 35.2 mL/min (A) (by C-G formula based on SCr of 1.41 mg/dL (H)). Liver Function Tests: Recent Labs  Lab 12/05/18 1231  AST 38  ALT 18  ALKPHOS 168*  BILITOT 0.4  PROT 7.2  ALBUMIN 3.4*   Recent Labs  Lab 12/05/18 1231  LIPASE 27   No results for input(s): AMMONIA in the last 168 hours. Coagulation Profile: Recent Labs  Lab 12/05/18 1231 12/06/18 0652  INR 3.4* 3.2*   Cardiac Enzymes: No results for input(s): CKTOTAL, CKMB, CKMBINDEX, TROPONINI in the last 168 hours. BNP (last 3 results) No results for input(s): PROBNP in the last 8760 hours. HbA1C: No results for input(s): HGBA1C in the last 72 hours. CBG: No results for input(s): GLUCAP in the last 168 hours. Lipid Profile: No results for input(s): CHOL, HDL, LDLCALC, TRIG, CHOLHDL, LDLDIRECT in the last 72 hours. Thyroid Function Tests: No results for input(s): TSH, T4TOTAL, FREET4, T3FREE, THYROIDAB in the last 72 hours. Anemia Panel: No results for input(s): VITAMINB12, FOLATE, FERRITIN, TIBC, IRON, RETICCTPCT in the last 72 hours. Sepsis Labs: No results for input(s): PROCALCITON, LATICACIDVEN in the last 168 hours.  No results  found for this or any previous visit (from the past 240 hour(s)).       Radiology Studies: Ct Renal Stone Study  Result Date: 12/05/2018 CLINICAL DATA:  Flank pain EXAM: CT ABDOMEN AND PELVIS WITHOUT CONTRAST TECHNIQUE: Multidetector CT imaging of the abdomen and pelvis was performed following the standard protocol without IV contrast. COMPARISON:  CT abdomen pelvis 05/23/2018 FINDINGS: LOWER CHEST: There is no basilar pleural or apical pericardial effusion. HEPATOBILIARY: The hepatic contours and density are normal. There is no intra- or extrahepatic biliary dilatation. Status post cholecystectomy. PANCREAS: The pancreatic parenchymal contours are normal and there is no ductal dilatation. There is no peripancreatic fluid collection. SPLEEN: Normal. ADRENALS/URINARY TRACT: --Adrenal glands: Normal. --Right kidney/ureter: No hydronephrosis, nephroureterolithiasis, perinephric stranding or solid renal mass. --Left kidney/ureter: No hydronephrosis, nephroureterolithiasis, perinephric stranding or solid renal mass. --Urinary bladder: Normal for degree of distention STOMACH/BOWEL: --Stomach/Duodenum: There is no hiatal hernia  or other gastric abnormality. The duodenal course and caliber are normal. --Small bowel: No dilatation or inflammation. --Colon: No focal abnormality. --Appendix: Normal. VASCULAR/LYMPHATIC: Normal course and caliber of the major abdominal vessels. No abdominal or pelvic lymphadenopathy. REPRODUCTIVE: Normal uterus and ovaries. MUSCULOSKELETAL. No bony spinal canal stenosis or focal osseous abnormality. OTHER: Unchanged small ventral abdominal hernia containing fat. IMPRESSION: No acute abdominopelvic abnormality. Electronically Signed   By: Ulyses Jarred M.D.   On: 12/05/2018 14:18        Scheduled Meds: . aspirin EC  81 mg Oral Daily  . fluticasone  1 spray Each Nare Daily  . warfarin  5 mg Oral q1800  . Warfarin - Pharmacist Dosing Inpatient   Does not apply q1800    Continuous Infusions: . sodium chloride 100 mL/hr at 12/06/18 0726     LOS: 0 days     Georgette Shell, MD [facility]  If 7PM-7AM, please contact night-coverage [email or web address] Password [facility] 12/06/2018, 10:44 AM

## 2018-12-06 NOTE — Progress Notes (Signed)
[place] for warfarin Indication: MVR  Allergies  Allergen Reactions  . Oxycodone Itching    Patient Measurements: Height: 5\' 2"  (157.5 cm) Weight: 128 lb 1.4 oz (58.1 kg) IBW/kg (Calculated) : 50.1 Vital Signs: Temp: 98.4 F (36.9 C) (03/12 0558) Temp Source: Oral (03/12 0558) BP: 117/67 (03/12 0558) Pulse Rate: 96 (03/12 0558)  Labs: Recent Labs    12/05/18 1231 12/05/18 1242 12/06/18 0652  HGB 13.3 13.3  --   HCT 38.1 39.0  --   PLT 376  --   --   LABPROT 33.5*  --  31.9*  INR 3.4*  --  3.2*  CREATININE 1.95* 1.90* 1.41*    Estimated Creatinine Clearance: 35.2 mL/min (A) (by C-G formula based on SCr of 1.41 mg/dL (H)).  Assessment: 58 y/o F with a h/o CVA and metallic MVR on chronic warfarin presenting to the ed with vomiting, diarrhea, and hypotension. Patient has been having some epistaxis per MD and INR is therapeutic although at higher end of therapeutic range (3.4) Baseline H/H = 13.3/39, Plt 376   Home Warfarin 7.5 MWF, 5mg  other days. LD 3/10 at 1800  Goal of Therapy:  INR 2.5-3.5  Today, 12/06/2018 INR 3.2 after 5mg  Warfarin Full liquid diet   Plan:   Warfarin 5mg  today at 1800  Daily Protime/INR  Monitor CBC, s/s bleed, noted epistaxis  [name] PharmD Pager [phone] 12/06/2018, 10:49 AM

## 2018-12-07 LAB — BASIC METABOLIC PANEL
Anion gap: 7 (ref 5–15)
BUN: 10 mg/dL (ref 6–20)
CO2: 17 mmol/L — ABNORMAL LOW (ref 22–32)
Calcium: 7.4 mg/dL — ABNORMAL LOW (ref 8.9–10.3)
Chloride: 113 mmol/L — ABNORMAL HIGH (ref 98–111)
Creatinine, Ser: 0.83 mg/dL (ref 0.44–1.00)
GFR calc Af Amer: >60 mL/min (ref >60)
GFR calc non Af Amer: >60 mL/min (ref >60)
Glucose, Bld: 76 mg/dL (ref 70–99)
Potassium: 3.6 mmol/L (ref 3.5–5.1)
Sodium: 137 mmol/L (ref 135–145)

## 2018-12-07 LAB — PROTIME-INR
INR: 3.3 — ABNORMAL HIGH (ref 0.8–1.2)
Prothrombin Time: 32.8 seconds — ABNORMAL HIGH (ref 11.4–15.2)

## 2018-12-07 MED ORDER — SACCHAROMYCES BOULARDII 250 MG PO CAPS: 250.0000 mg | ORAL_CAPSULE | Freq: Two times a day (BID) | ORAL | 0 refills | Status: DC

## 2018-12-07 NOTE — Progress Notes (Signed)
[place] for warfarin Indication: MVR  Allergies  Allergen Reactions  . Oxycodone Itching   Patient Measurements: Height: 5\' 2"  (157.5 cm) Weight: 128 lb 1.4 oz (58.1 kg) IBW/kg (Calculated) : 50.1 Vital Signs: Temp: 97.9 F (36.6 C) (03/13 0640) Temp Source: Oral (03/13 0640) BP: 134/88 (03/13 0640) Pulse Rate: 112 (03/13 0640)  Labs: Recent Labs    12/05/18 1231 12/05/18 1242 12/06/18 0652 12/07/18 0618  HGB 13.3 13.3  --   --   HCT 38.1 39.0  --   --   PLT 376  --   --   --   LABPROT 33.5*  --  31.9* 32.8*  INR 3.4*  --  3.2* 3.3*  CREATININE 1.95* 1.90* 1.41* 0.83   Estimated Creatinine Clearance: 59.9 mL/min (by C-G formula based on SCr of 0.83 mg/dL).  Assessment: 58 y/o F with a h/o CVA and metallic MVR on chronic warfarin presenting to the ed with vomiting, diarrhea, and hypotension. Patient has been having some epistaxis per MD and INR is therapeutic although at higher end of therapeutic range (3.4) Baseline H/H = 13.3/39, Plt 376   Home Warfarin 7.5 MWF, 5mg  other days. LD 3/10 at 1800  Goal of Therapy:  INR 2.5-3.5  Today, 12/07/2018 INR 3.3 after 5mg  Warfarin Full liquid diet   Plan:  For discharge today, have spoken with patient, she will resume her Warfarin dose today at home.  [name] PharmD Pager [phone] 12/07/2018, 7:32 AM

## 2018-12-07 NOTE — TOC Transition Note (Signed)
Transition of Care [facility]) - CM/SW Discharge Note   Patient Details  Name: Mary [name] MRN: [id number] Date of Birth: 1961/01/15  Transition of [facility] CM/SW Contact:  Leeroy Cha, RN Phone Number: 12/07/2018, 11:07 AM   Clinical Narrative:    Discharged to home with slef care, lives alone but has friends close by.   Final next level of care: Home/Self Care Barriers to Discharge: No Barriers Identified   Patient Goals and CMS Choice Patient states their goals for this hospitalization and ongoing recovery are:: just to go home and be well      Discharge Placement                       Discharge Plan and Services Discharge Planning Services: CM Consult                      Social Determinants of Health (SDOH) Interventions     Readmission Risk Interventions No flowsheet data found.

## 2018-12-07 NOTE — Discharge Summary (Signed)
Physician Discharge Summary  Mary [name] WNU:[id number] DOB: 05/22/1961 DOA: 12/05/2018  PCP: Antony Blackbird, MD  Admit date: 12/05/2018 Discharge date: 12/07/2018  Admitted From: Home Disposition: Home  Recommendations for Outpatient Follow-up:  1. Follow up with PCP in 1-2 weeks 2. 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 Patient does not recollect any precipitating factors including unusual food intake, sick contacts or new medications. Patient was feeling somewhat lightheaded yesterday evening but got worse this morning which brought her to the hospital. Upon arrival in the ED patient was noted to be hypotensive with systolic blood pressure in the 80s, tachycardic with heart rate in the 110s and labs revealed hyponatremia/AKI.Blood pressure improved to 110/88 with 1 L of IV fluid bolus and patient feels better. She is requested to be admitted for further evaluation and continued IV hydration.   Review of systems negative except for report of epistaxis over the last 2 days, mostly when she blows her nose. INR in ED today therapeutic at 3.4. Patient also takes aspirin for history of peripheral vascular disease/?CVA.She denies any abdominal pain, hematemesis, melena or hematochezia.Other10 point review of systems negative.    Discharge Diagnoses:  Active Problems:   Gastroenteritis   Dehydration   1.Acute gastroenteritis: Patient had no further diarrhea overnight no nausea or vomiting and she is able to tolerate regular diet.  She will be discharged  home today I have requested her to or advised her to take small portions no fiber fried or fatty food and eat home-cooked meals.  Continue probiotics.    2.AKI: Solved with IV fluids. 3.Hypovolemic hyponatremia: Resolved with IV fluids.  Sodium 137 upon discharge.  4.Epistaxis: Now resolved. In the setting of dry nose/aspirin and Coumadin. INR therapeutic.   5. Severe MR status post mitral valve replacement.  Continue Coumadin as you are taking at home.  6.Chronic diastolic CHF: Compensated. Watch on IV hydration. Not on diuretics at baseline  7. Carotid artery stenosis/CVA: Resume aspirin.? Not on statins.  Estimated body mass index is 23.43 kg/m as calculated from the following:   Height as of this encounter: 5\' 2"  (1.575 m).   Weight as of this encounter: 58.1 kg.  Discharge Instructions   Allergies as of 12/07/2018      Reactions   Oxycodone Itching      Medication List    TAKE these medications   aspirin EC 81 MG tablet Take 1 tablet (81 mg total) by mouth daily.   CLEAR EYES COMPLETE OP Place 2 drops into both eyes daily as needed (red eyes).   saccharomyces boulardii 250 MG capsule Commonly known as:  FLORASTOR Take 1 capsule (250 mg total) by mouth 2 (two) times daily.   warfarin 5 MG tablet Commonly known as:  COUMADIN Take as directed. If you are unsure how to take this medication, talk to your nurse or doctor. Original instructions:  Take as directed by coumadin clinic What changed:    how much to take  how to take this  when to take this  additional instructions   WITCH HAZEL EX Apply 1 application topically at bedtime as needed (knee pain, bruise on arm).  Follow-up Information    Fulp, Cammie, MD Follow up.   Specialty:  Family Medicine Contact information: [place] [address] [phone]        Nelva Bush, MD .   Specialty:  Cardiology Contact information: [address] [place]  [address] [phone]          Allergies  Allergen Reactions  . Oxycodone Itching    Consultations: None  Procedures/Studies: Ct Renal Stone Study  Result Date: 12/05/2018 CLINICAL DATA:  Flank pain EXAM: CT ABDOMEN AND PELVIS WITHOUT CONTRAST TECHNIQUE: Multidetector CT imaging of the abdomen and pelvis was performed following the standard protocol without IV contrast. COMPARISON:  CT abdomen pelvis 05/23/2018 FINDINGS: LOWER CHEST: There is no basilar pleural or apical pericardial effusion. HEPATOBILIARY: The hepatic contours and density are normal. There is no intra- or extrahepatic biliary dilatation. Status post cholecystectomy. PANCREAS: The pancreatic parenchymal contours are normal and there is no ductal dilatation. There is no peripancreatic fluid collection. SPLEEN: Normal. ADRENALS/URINARY TRACT: --Adrenal glands: Normal. --Right kidney/ureter: No hydronephrosis, nephroureterolithiasis, perinephric stranding or solid renal mass. --Left kidney/ureter: No hydronephrosis, nephroureterolithiasis, perinephric stranding or solid renal mass. --Urinary bladder: Normal for degree of distention STOMACH/BOWEL: --Stomach/Duodenum: There is no hiatal hernia or other gastric abnormality. The duodenal course and caliber are normal. --Small bowel: No dilatation or inflammation. --Colon: No focal abnormality. --Appendix: Normal. VASCULAR/LYMPHATIC: Normal course and caliber of the major abdominal vessels. No abdominal or pelvic lymphadenopathy. REPRODUCTIVE: Normal uterus and ovaries. MUSCULOSKELETAL. No bony spinal canal stenosis or focal osseous abnormality. OTHER: Unchanged small ventral abdominal hernia containing fat. IMPRESSION: No acute abdominopelvic abnormality. Electronically Signed   By: Ulyses Jarred M.D.   On: 12/05/2018 14:18    (Echo, Carotid, EGD, Colonoscopy, ERCP)    Subjective: Sitting up in chair in no acute distress denies any new complaints tolerating p.o.  intake no dizziness chest pain nausea vomiting.  Discharge Exam: Vitals:   12/06/18 2128 12/07/18 0640  BP: (!) 134/92 134/88  Pulse: (!) 102 (!) 112  Resp: 16 19  Temp: 98.4 F (36.9 C) 97.9 F (36.6 C)  SpO2: 100% 100%   Vitals:   12/06/18 0558 12/06/18 1436 12/06/18 2128 12/07/18 0640  BP: 117/67 126/82 (!) 134/92 134/88  Pulse: 96 (!) 101 (!) 102 (!) 112  Resp: 17  16 19   Temp: 98.4 F (36.9 C) 98.8 F (37.1 C) 98.4 F (36.9 C) 97.9 F (36.6 C)  TempSrc: Oral Oral Oral Oral  SpO2: 99% 100% 100% 100%  Weight:      Height:        General: Pt is alert, awake, not in acute distress Cardiovascular: RRR, S1/S2 +, no rubs, no gallops Respiratory: CTA bilaterally, no wheezing, no rhonchi Abdominal: Soft, NT, ND, bowel sounds + Extremities: no edema, no cyanosis    The results of significant diagnostics from this hospitalization (including imaging, microbiology, ancillary and laboratory) are listed below for reference.     Microbiology: No results found for this or any previous visit (from the past 240 hour(s)).   Labs: BNP (last 3 results) No results for input(s): BNP in the last 8760 hours. Basic Metabolic Panel: Recent Labs  Lab 12/05/18 1231 12/05/18 1242 12/06/18 0652 12/07/18 0618  NA 127* 126* 133* 137  K 4.1 4.3 3.8 3.6  CL 92*  --  106 113*  CO2 19*  --  19* 17*  GLUCOSE 90  --  86 76  BUN 16  --  14 10  CREATININE 1.95* 1.90* 1.41* 0.83  CALCIUM 8.7*  --  8.1* 7.4*   Liver Function Tests: Recent Labs  Lab 12/05/18 1231  AST 38  ALT 18  ALKPHOS 168*  BILITOT 0.4  PROT 7.2  ALBUMIN 3.4*   Recent Labs  Lab 12/05/18 1231  LIPASE 27   No results for input(s): AMMONIA in the last 168 hours. CBC: Recent Labs  Lab 12/05/18 1231 12/05/18 1242  WBC 8.9  --   HGB 13.3 13.3  HCT 38.1 39.0  MCV 103.0*  --   PLT 376  --    Cardiac Enzymes: No results for input(s): CKTOTAL, CKMB, CKMBINDEX, TROPONINI in the last 168  hours. BNP: Invalid input(s): POCBNP CBG: No results for input(s): GLUCAP in the last 168 hours. D-Dimer No results for input(s): DDIMER in the last 72 hours. Hgb A1c No results for input(s): HGBA1C in the last 72 hours. Lipid Profile No results for input(s): CHOL, HDL, LDLCALC, TRIG, CHOLHDL, LDLDIRECT in the last 72 hours. Thyroid function studies No results for input(s): TSH, T4TOTAL, T3FREE, THYROIDAB in the last 72 hours.  Invalid input(s): FREET3 Anemia work up No results for input(s): VITAMINB12, FOLATE, FERRITIN, TIBC, IRON, RETICCTPCT in the last 72 hours. Urinalysis    Component Value Date/Time   COLORURINE YELLOW 12/05/2018 1337   APPEARANCEUR CLOUDY (A) 12/05/2018 1337   LABSPEC 1.009 12/05/2018 1337   PHURINE 5.0 12/05/2018 1337   GLUCOSEU NEGATIVE 12/05/2018 1337   HGBUR LARGE (A) 12/05/2018 1337   BILIRUBINUR NEGATIVE 12/05/2018 1337   KETONESUR NEGATIVE 12/05/2018 1337   PROTEINUR 100 (A) 12/05/2018 1337   UROBILINOGEN 0.2 06/04/2015 1533   NITRITE NEGATIVE 12/05/2018 1337   LEUKOCYTESUR SMALL (A) 12/05/2018 1337   Sepsis Labs Invalid input(s): PROCALCITONIN,  WBC,  LACTICIDVEN Microbiology No results found for this or any previous visit (from the past 240 hour(s)).   Time coordinating discharge:34 minutes  SIGNED:   Georgette Shell, MD  [facility] 12/07/2018, 8:40 AM Pager   If 7PM-7AM, please contact night-coverage [email or web address] Password [email or web address]

## 2018-12-07 NOTE — Progress Notes (Signed)
Patient given discharge instructions, and verbalized an understanding of all discharge instructions.  Patient agrees with discharge plan, and is being discharged in stable medical condition.  Patient given transportation via wheelchair. 

## 2018-12-20 ENCOUNTER — Telehealth: Payer: Self-pay | Admitting: Physician Assistant

## 2018-12-20 NOTE — Telephone Encounter (Signed)
Discussed reasons to cancel appt due to COVID-19. Last visit with Dr. Lovena Le in July 2019.  Pacemaker recommended at that time.  She never called back. She continues to have palpitations.  No syncope.  She had a recent admission with AKI, low BP due to gastroenteritis.  She had near syncope in this setting.   She is ok to postpone her appt for 4 weeks. I did explain that elective procedures are currently on hold.  PLAN:  1. Appt 12/24/2018 canceled. 2. Reschedule for 4 weeks from now.   (I will also send note to Dr. Lovena Le and his RN as it may make more sense for him to see her back since she is in need of a pacemaker.) Richardson Dopp, PA-C    12/20/2018 4:19 PM

## 2018-12-24 ENCOUNTER — Ambulatory Visit: Payer: Self-pay | Admitting: Physician Assistant

## 2018-12-25 NOTE — Telephone Encounter (Signed)
Agree with Mr. Mary [name]. GT

## 2019-01-03 ENCOUNTER — Telehealth: Payer: Self-pay

## 2019-01-03 NOTE — Telephone Encounter (Signed)

## 2019-01-04 ENCOUNTER — Telehealth: Payer: Self-pay | Admitting: Internal Medicine

## 2019-01-04 NOTE — Telephone Encounter (Signed)
Called patient.  Let her know that tylenol is okay to take with her warfarin.  Recommended no more than 5 tablets in any given day.  Patient voiced understanding.

## 2019-01-04 NOTE — Telephone Encounter (Signed)
° ° °  Patient calling to find requesting advice for a headache. She wants to know if she can take Tylenol with her Coumadin

## 2019-01-07 ENCOUNTER — Other Ambulatory Visit: Payer: Self-pay

## 2019-01-07 ENCOUNTER — Telehealth: Payer: Self-pay | Admitting: Internal Medicine

## 2019-01-07 ENCOUNTER — Ambulatory Visit (INDEPENDENT_AMBULATORY_CARE_PROVIDER_SITE_OTHER): Payer: Self-pay | Admitting: *Deleted

## 2019-01-07 ENCOUNTER — Telehealth (HOSPITAL_COMMUNITY): Payer: Self-pay | Admitting: Licensed Clinical Social Worker

## 2019-01-07 DIAGNOSIS — I059 Rheumatic mitral valve disease, unspecified: Secondary | ICD-10-CM

## 2019-01-07 DIAGNOSIS — Z7901 Long term (current) use of anticoagulants: Secondary | ICD-10-CM

## 2019-01-07 LAB — POCT INR: INR: 1.6 — AB (ref 2.0–3.0)

## 2019-01-07 MED ORDER — WARFARIN SODIUM 5 MG PO TABS: ORAL_TABLET | ORAL | 0 refills | Status: DC

## 2019-01-07 NOTE — Telephone Encounter (Signed)
Pt called and said she was speaking with a Melissa previously about a gift card($25) for Warfarin. She wanted to know if the card was available to pick up at [facility]. She is within walking distance to [place], but does not have transportation to come back to [employer] to get the card

## 2019-01-07 NOTE — Telephone Encounter (Signed)
CSW referred to contact patient to inquire about food insecurity during this health crisis. Patient also shared challenges with obtaining medication at the [facility]. CSW assisted with navigating the finances and transportation to obtain needed medication.  Patient reported need and agreeable to weekly delivery from [employer]. Patient informed that delivery will be left on front door with no face to face contact with delivery person. Patient agreeable to plan and grateful for the assistance. Raquel Sarna, [place], [place]

## 2019-01-11 ENCOUNTER — Telehealth: Payer: Self-pay

## 2019-01-11 NOTE — Telephone Encounter (Signed)
lmom for prescreen  

## 2019-01-14 ENCOUNTER — Telehealth: Payer: Self-pay | Admitting: Licensed Clinical Social Worker

## 2019-01-14 NOTE — Telephone Encounter (Signed)
CSW contacted patient to follow up on weekly food delivery package. Patient informed of delivery time and no face to face contact during delivery. Patient verbalizes understanding and grateful for the assistance.  CSW continues to follow for supportive needs. Jackie Kaylei Frink, LCSW, CCSW-MCS [phone]  

## 2019-01-15 ENCOUNTER — Telehealth: Payer: Self-pay | Admitting: Internal Medicine

## 2019-01-15 NOTE — Telephone Encounter (Signed)
New message   [facility] for pt to call back about May 6th appt with Dr. Lovena Le. Need to RS pt for virtual visit-doxemity video if pt has a smart phone.

## 2019-01-15 NOTE — Telephone Encounter (Signed)
New message:   Patient returning call back concering her appt.

## 2019-01-15 NOTE — Telephone Encounter (Signed)
F/u       Virtual Visit Pre-Appointment Phone Call  "(Name), I am calling you today to discuss your upcoming appointment. We are currently trying to limit exposure to the virus that causes COVID-19 by seeing patients at home rather than in the office."  1. "What is the BEST phone number to call the day of the visit?" - include this in appointment notes  2. Do you have or have access to (through a family member/friend) a smartphone with video capability that we can use for your visit?" a. If yes - list this number in appt notes as cell (if different from BEST phone #) and list the appointment type as a VIDEO visit in appointment notes b. If no - list the appointment type as a PHONE visit in appointment notes  3. Confirm consent - "In the setting of the current Covid19 crisis, you are scheduled for a (phone or video) visit with your provider on (date) at (time).  Just as we do with many in-office visits, in order for you to participate in this visit, we must obtain consent.  If you'd like, I can send this to your mychart (if signed up) or email for you to review.  Otherwise, I can obtain your verbal consent now.  All virtual visits are billed to your insurance company just like a normal visit would be.  By agreeing to a virtual visit, we'd like you to understand that the technology does not allow for your provider to perform an examination, and thus may limit your provider's ability to fully assess your condition. If your provider identifies any concerns that need to be evaluated in person, we will make arrangements to do so.  Finally, though the technology is pretty good, we cannot assure that it will always work on either your or our end, and in the setting of a video visit, we may have to convert it to a phone-only visit.  In either situation, we cannot ensure that we have a secure connection.  Are you willing to proceed?" STAFF: Did the patient verbally acknowledge consent to telehealth visit?  Document YES/NO here: yes   4. Advise patient to be prepared - "Two hours prior to your appointment, go ahead and check your blood pressure, pulse, oxygen saturation, and your weight (if you have the equipment to check those) and write them all down. When your visit starts, your provider will ask you for this information. If you have an Apple Watch or Kardia device, please plan to have heart rate information ready on the day of your appointment. Please have a pen and paper handy nearby the day of the visit as well."  5. Give patient instructions for MyChart download to smartphone OR Doximity/Doxy.me as below if video visit (depending on what platform provider is using)  6. Inform patient they will receive a phone call 15 minutes prior to their appointment time (may be from unknown caller ID) so they should be prepared to answer    TELEPHONE CALL NOTE  Mary [name] has been deemed a candidate for a follow-up tele-health visit to limit community exposure during the Covid-19 pandemic. I spoke with the patient via phone to ensure availability of phone/video source, confirm preferred email & phone number, and discuss instructions and expectations.  I reminded ARNESHIA ADE to be prepared with any vital sign and/or heart rhythm information that could potentially be obtained via home monitoring, at the time of her visit. I reminded NAYSA PUSKAS to  expect a phone call prior to her visit.  [place] Harriette Ohara 01/15/2019 3:52 PM   INSTRUCTIONS FOR DOWNLOADING THE MYCHART APP TO SMARTPHONE  - The patient must first make sure to have activated MyChart and know their login information - If Apple, go to [employer] and type in MyChart in the search bar and download the app. If Android, ask patient to go to [employer] and type in [place] in the search bar and download the app. The app is free but as with any other app downloads, their phone may require them to verify saved payment information or  Apple/Android password.  - The patient will need to then log into the app with their MyChart username and password, and select [facility] as their healthcare provider to link the account. When it is time for your visit, go to the MyChart app, find appointments, and click Begin Video Visit. Be sure to Select Allow for your device to access the Microphone and Camera for your visit. You will then be connected, and your provider will be with you shortly.  **If they have any issues connecting, or need assistance please contact MyChart service desk [phone])**  **If using a computer, in order to ensure the best quality for their visit they will need to use either of the following Internet Browsers: [employer], or Google Chrome**  IF USING DOXIMITY or DOXY.ME - The patient will receive a link just prior to their visit by text.     FULL LENGTH CONSENT FOR TELE-HEALTH VISIT   I hereby voluntarily request, consent and authorize [facility] and its employed or contracted physicians, physician assistants, nurse practitioners or other licensed health care professionals (the Practitioner), to provide me with telemedicine health care services (the Services") as deemed necessary by the treating Practitioner. I acknowledge and consent to receive the Services by the Practitioner via telemedicine. I understand that the telemedicine visit will involve communicating with the Practitioner through live audiovisual communication technology and the disclosure of certain medical information by electronic transmission. I acknowledge that I have been given the opportunity to request an in-person assessment or other available alternative prior to the telemedicine visit and am voluntarily participating in the telemedicine visit.  I understand that I have the right to withhold or withdraw my consent to the use of telemedicine in the course of my care at any time, without affecting my right to future care  or treatment, and that the Practitioner or I may terminate the telemedicine visit at any time. I understand that I have the right to inspect all information obtained and/or recorded in the course of the telemedicine visit and may receive copies of available information for a reasonable fee.  I understand that some of the potential risks of receiving the Services via telemedicine include:   Delay or interruption in medical evaluation due to technological equipment failure or disruption;  Information transmitted may not be sufficient (e.g. poor resolution of images) to allow for appropriate medical decision making by the Practitioner; and/or   In rare instances, security protocols could fail, causing a breach of personal health information.  Furthermore, I acknowledge that it is my responsibility to provide information about my medical history, conditions and care that is complete and accurate to the best of my ability. I acknowledge that Practitioner's advice, recommendations, and/or decision may be based on factors not within their control, such as incomplete or inaccurate data provided by me or distortions of diagnostic images or specimens that  may result from electronic transmissions. I understand that the practice of medicine is not an exact science and that Practitioner makes no warranties or guarantees regarding treatment outcomes. I acknowledge that I will receive a copy of this consent concurrently upon execution via email to the email address I last provided but may also request a printed copy by calling the office of [facility].    I understand that my insurance will be billed for this visit.   I have read or had this consent read to me.  I understand the contents of this consent, which adequately explains the benefits and risks of the Services being provided via telemedicine.   I have been provided ample opportunity to ask questions regarding this consent and the Services and have had  my questions answered to my satisfaction.  I give my informed consent for the services to be provided through the use of telemedicine in my medical care  By participating in this telemedicine visit I agree to the above.

## 2019-01-18 ENCOUNTER — Telehealth: Payer: Self-pay

## 2019-01-18 NOTE — Telephone Encounter (Signed)

## 2019-01-21 ENCOUNTER — Ambulatory Visit (INDEPENDENT_AMBULATORY_CARE_PROVIDER_SITE_OTHER): Payer: Self-pay | Admitting: *Deleted

## 2019-01-21 ENCOUNTER — Telehealth (HOSPITAL_COMMUNITY): Payer: Self-pay | Admitting: Licensed Clinical Social Worker

## 2019-01-21 ENCOUNTER — Other Ambulatory Visit: Payer: Self-pay

## 2019-01-21 DIAGNOSIS — I059 Rheumatic mitral valve disease, unspecified: Secondary | ICD-10-CM

## 2019-01-21 DIAGNOSIS — Z7901 Long term (current) use of anticoagulants: Secondary | ICD-10-CM

## 2019-01-21 LAB — POCT INR: INR: 3.3 — AB (ref 2.0–3.0)

## 2019-01-21 NOTE — Telephone Encounter (Signed)
CSW contacted patient to follow up on weekly food delivery package. Patient informed of delivery time and no face to face contact during delivery. Patient verbalizes understanding and grateful for the assistance.  CSW continues to follow for supportive needs. Jackie Xana Bradt, LCSW, CCSW-MCS [phone]  

## 2019-01-25 ENCOUNTER — Other Ambulatory Visit: Payer: Self-pay

## 2019-01-25 ENCOUNTER — Telehealth (INDEPENDENT_AMBULATORY_CARE_PROVIDER_SITE_OTHER): Payer: Medicaid Other | Admitting: Internal Medicine

## 2019-01-25 DIAGNOSIS — I5032 Chronic diastolic (congestive) heart failure: Secondary | ICD-10-CM

## 2019-01-25 NOTE — Progress Notes (Signed)
Electrophysiology TeleHealth Note   Due to national recommendations of social distancing due to COVID 19, an audio/video telehealth visit is felt to be most appropriate for this patient at this time.  See MyChart message from today for the patient's consent to telehealth for [facility].   Date:  01/25/2019   ID:  Mary [name], DOB 02-23-1961, MRN [id number]  Location: patient's home  Provider location: [address] [place]  Evaluation Performed: Follow-up visit  PCP:  Antony Blackbird, MD  Cardiologist:  Nelva Bush, MD  Electrophysiologist:  Dr Lovena Le  Chief Complaint:  "I've been short of breath and weak"  History of Present Illness:    Mary [name] is a 58 y.o. female who presents via audio/video conferencing for a telehealth visit today.  She is a pleasant obese 58 yo woman with a h/o valvular heart disease, chronic diastolic heart failure, transient CHB, not on medical therapy, and GERD. She was in the ED with nausea and vomiting about 6 weeks ago. She c/o feeling weak and sob. No edema. Since last being seen in our clinic, the patient reports doing very well.  Today, she denies symptoms of palpitations, chest pain,   lower extremity edema, dizziness, presyncope, or syncope.  The patient is otherwise without complaint today.  The patient denies symptoms of fevers, chills, cough, or new SOB worrisome for COVID 19.  Past Medical History:  Diagnosis Date  . Acute diastolic heart failure ([place]) 03/15/2013  . Anemia   . Carotid stenosis    Carotid US 2/17: bilateral ICA 1-39% >> FU prn  . Childhood asthma   . Complete heart block ([place]) 05/28/2013   Post-op  . Epigastric hernia 200's  . GERD (gastroesophageal reflux disease)   . Heart murmur   . History of blood transfusion    "14 w/1st pregnancy; 2 w/last C-section" (03/15/2013)  . Hx of echocardiogram    a. Echo 4/16:  Mild focal basal septal hypertrophy, EF 60-65%, no RWMA, Mechanical MVR ok with mild  central regurgitation and no perivalvular leak, small mobile density attached to valvular ring (1.5x1 cm) - post surgical changes vs SBE, mild LAE;   b. Echo 2/17:  Mild post wall LVH, EF 55-60%, no RWMA, mechanical MVR ok (mean 5 mmHg), normal RVSF  . Hypertension    no pcp   will go to [facility]  . 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 S/P mitral valve replacement with metallic valve 01/02/8118   37mm Sorin Carbomedics mechanical prosthesis via right mini thoracotomy approach  . 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 Severe mitral regurgitation 04/22/2013  . Stroke ([place])   . SVT (supraventricular tachycardia) ([place]) 03/16/2013  . Tricuspid regurgitation     Past Surgical History:  Procedure Laterality Date  . APPENDECTOMY  [id number]  . CARDIAC CATHETERIZATION    . CARDIAC VALVE REPLACEMENT    . CESAREAN SECTION  1983  . CESAREAN SECTION WITH BILATERAL TUBAL LIGATION  1999  . FEMORAL HERNIA REPAIR Right 05/23/2013   Procedure: HERNIA REPAIR FEMORAL;  Surgeon: Rexene Alberts, MD;  Location: [place];  Service: Open Heart Surgery;  Laterality: Right;  . INTRAOPERATIVE TRANSESOPHAGEAL ECHOCARDIOGRAM N/A 05/23/2013   Procedure: INTRAOPERATIVE TRANSESOPHAGEAL ECHOCARDIOGRAM;  Surgeon: Rexene Alberts, MD;  Location: [place];  Service: Open Heart Surgery;  Laterality: N/A;  .  LAPAROSCOPIC CHOLECYSTECTOMY  2003  . LEFT AND RIGHT HEART CATHETERIZATION WITH CORONARY ANGIOGRAM N/A 03/22/2013   Procedure: LEFT AND RIGHT HEART CATHETERIZATION WITH CORONARY ANGIOGRAM;  Surgeon: Burnell Blanks, MD;  Location: [facility] CATH LAB;  Service: Cardiovascular;  Laterality: N/A;  . MINIMALLY INVASIVE TRICUSPID VALVE REPAIR Right 05/23/2013   Procedure: MINIMALLY INVASIVE TRICUSPID VALVE REPAIR;  Surgeon: Rexene Alberts, MD;   Location: [place];  Service: Open Heart Surgery;  Laterality: Right;  . MITRAL VALVE REPLACEMENT N/A 05/23/2013   Procedure: MITRAL VALVE (MV) REPLACEMENT;  Surgeon: Rexene Alberts, MD;  Location: [place];  Service: Open Heart Surgery;  Laterality: N/A;  . MULTIPLE EXTRACTIONS WITH ALVEOLOPLASTY N/A 04/04/2013   Procedure: Extraction of tooth #'s 1,8,9,13,14,15,23,24,25,26 with alveoloplasty and gross debridement of remaining teeth;  Surgeon: Lenn Cal, DDS;  Location: [place];  Service: Oral Surgery;  Laterality: N/A;  . TEE WITHOUT CARDIOVERSION N/A 03/18/2013   Procedure: TRANSESOPHAGEAL ECHOCARDIOGRAM (TEE);  Surgeon: Larey Dresser, MD;  Location: [place];  Service: Cardiovascular;  Laterality: N/A;  . TEE WITHOUT CARDIOVERSION N/A 06/17/2013   Procedure: TRANSESOPHAGEAL ECHOCARDIOGRAM (TEE);  Surgeon: Lelon Perla, MD;  Location: [facility] ENDOSCOPY;  Service: Cardiovascular;  Laterality: N/A;  . TUBAL LIGATION  1999    Current Outpatient Medications  Medication Sig Dispense Refill  . aspirin EC 81 MG tablet Take 1 tablet (81 mg total) by mouth daily.    . Hyprom-Naphaz-Polysorb-Zn Sulf (CLEAR EYES COMPLETE OP) Place 2 drops into both eyes daily as needed (red eyes).    . saccharomyces boulardii (FLORASTOR) 250 MG capsule Take 1 capsule (250 mg total) by mouth 2 (two) times daily. 60 capsule 0  . warfarin (COUMADIN) 5 MG tablet Take as directed by coumadin clinic 50 tablet 0  . WITCH HAZEL EX Apply 1 application topically at bedtime as needed (knee pain, bruise on arm).      No current facility-administered medications for this visit.     Allergies:   Oxycodone   Social History:  The patient  reports that she quit smoking about 5 years ago. Her smoking use included cigarettes. She has a 18.00 pack-year smoking history. She has never used smokeless tobacco. She reports current alcohol use. She reports that she does not use drugs.   Family History:  The patient's family history  includes Cancer in her mother; Sarcoidosis in her sister; Stroke in her father.   ROS:  Please see the history of present illness.   All other systems are personally reviewed and negative.    Exam:    Vital Signs:  LMP 03/22/2013  none taken today    Labs/Other Tests and Data Reviewed:    Recent Labs: 03/16/2018: TSH 3.000 05/31/2018: Magnesium 1.7 12/05/2018: ALT 18; Hemoglobin 13.3; Platelets 376 12/07/2018: BUN 10; Creatinine, Ser 0.83; Potassium 3.6; Sodium 137   Wt Readings from Last 3 Encounters:  12/05/18 128 lb 1.4 oz (58.1 kg)  11/20/18 129 lb (58.5 kg)  08/14/18 127 lb (57.6 kg)     Other studies personally reviewed:   ASSESSMENT & PLAN:    1.  Acute on chronic systolic heart failure - it is difficult to no what her volume status is over the phone. I will ask Dr. Saunders Revel her primary cardiologist to see her in the office as she c/o sob and it is very difficult to know what her volume status is over the phone. She is not on lasix. 2. Transient heart block - since I saw  her several months ago for this problem and recommended a PPM, she has not had syncope and has not required PPM. 3. COVID 19 screen The patient denies symptoms of COVID 19 at this time.  The importance of social distancing was discussed today.  Follow-up: prn Next remote: n/a  Current medicines are reviewed at length with the patient today.   The patient does not have concerns regarding her medicines.  The following changes were made today:  none  Labs/ tests ordered today include:  No orders of the defined types were placed in this encounter.    Patient Risk:  after full review of this patients clinical status, I feel that they are at moderate risk at this time.  Today, I have spent 15 minutes with the patient with telehealth technology discussing all of the above.    Signed, Cristopher Peru, MD  01/25/2019 10:22 AM     [address] [place] [address]  [phone] (office) [phone] (fax)

## 2019-01-28 ENCOUNTER — Telehealth: Payer: Self-pay | Admitting: Licensed Clinical Social Worker

## 2019-01-28 NOTE — Telephone Encounter (Signed)
CSW contacted patient to follow up on weekly food delivery package. Patient informed of delivery time and no face to face contact during delivery. Patient verbalizes understanding and grateful for the assistance.  CSW continues to follow for supportive needs. Jackie Shatonia Hoots, LCSW, CCSW-MCS [phone]  

## 2019-01-29 ENCOUNTER — Telehealth: Payer: Self-pay | Admitting: *Deleted

## 2019-01-29 NOTE — Telephone Encounter (Signed)
To Dr. Erby Pian C to review.

## 2019-01-29 NOTE — Telephone Encounter (Signed)
I am happy to see her in the office.  However, please clarify if Ms. Donnell is willing/able to see me in [place] (I previously followed her at [facility]  If she wishes to stay in [place], please have her f/u with any available APP/MD in 2-3 weeks in the office.  Thanks.  Nelva Bush, MD [facility] Pager: [phone]

## 2019-01-29 NOTE — Telephone Encounter (Signed)
Called and spoke with patient. She would like to remain in [place] and not travel to [place].  Advised her I would route to scheduling pool in [place]. I encouraged her to call [place] back in the next week if she has not been scheduled for an appointment yet.

## 2019-01-29 NOTE — Telephone Encounter (Signed)
-----   Message from Damian Leavell, RN sent at 01/29/2019  7:18 AM EDT ----- Regarding: NEEDS FACE TO FACE APPT WITH DR. END IN 2-3 WEEKS Good morning all, Dr. Lovena Le did a televisit with this patient and wants a 2-3 week follow up appt with Dr. Saunders Revel in the office d/t sob  with possible fluid overload please.  Thank you Sonia Baller RN for Dr. Lovena Le

## 2019-01-30 ENCOUNTER — Telehealth: Payer: Self-pay | Admitting: Internal Medicine

## 2019-02-01 ENCOUNTER — Telehealth: Payer: Self-pay

## 2019-02-01 NOTE — Telephone Encounter (Signed)

## 2019-02-04 ENCOUNTER — Ambulatory Visit (INDEPENDENT_AMBULATORY_CARE_PROVIDER_SITE_OTHER): Payer: Self-pay | Admitting: *Deleted

## 2019-02-04 ENCOUNTER — Other Ambulatory Visit: Payer: Self-pay

## 2019-02-04 ENCOUNTER — Telehealth: Payer: Self-pay | Admitting: Licensed Clinical Social Worker

## 2019-02-04 DIAGNOSIS — Z7901 Long term (current) use of anticoagulants: Secondary | ICD-10-CM

## 2019-02-04 LAB — POCT INR: INR: 4.3 — AB (ref 2.0–3.0)

## 2019-02-04 NOTE — Telephone Encounter (Signed)
CSW contacted patient to follow up on weekly food delivery package. Patient informed of delivery time and no face to face contact during delivery. Message left as no answer.  CSW continues to follow for supportive needs. Jackie Mckaila Duffus, LCSW, CCSW-MCS [phone] 

## 2019-02-05 ENCOUNTER — Telehealth: Payer: Self-pay | Admitting: Licensed Clinical Social Worker

## 2019-02-05 NOTE — Telephone Encounter (Signed)
Patient called stating she has an appointment on Thursday at 3;30pm and has no transportation to the office. CSW will arrange for a cab ride on Thursday to [employer]. Patient grateful for the assistance. CSW available as needed. Raquel Sarna, [place], [place]

## 2019-02-07 ENCOUNTER — Telehealth (HOSPITAL_COMMUNITY): Payer: Self-pay | Admitting: Licensed Clinical Social Worker

## 2019-02-07 ENCOUNTER — Other Ambulatory Visit: Payer: Self-pay

## 2019-02-07 ENCOUNTER — Ambulatory Visit: Payer: Medicaid Other | Admitting: Cardiovascular Disease

## 2019-02-07 NOTE — Telephone Encounter (Signed)
CSW consulted to assist with transportation to appt this afternoon with [facility] office.  CSW called pt to confirm and pt states she has not been feeling well this morning (nausea, vomiting) and has called their office to reschedule the appt or change it to be virtual so is no longer in need of a ride  CSW will continue to follow and assist as needed  Jorge Ny, [facility] Desk#: [phone] Cell#: [phone]

## 2019-02-07 NOTE — Telephone Encounter (Signed)
Called the patient to discuss options for her appointment which is scheduled with Dr. Acie Fredrickson for this afternoon. I asked if we could schedule her for a virtual visit with either Dr. Acie Fredrickson or Dr. Saunders Revel if she prefers to stay with him. She states she needs to see a doctor in [place] because of transportation issues. She is upset because she wanted to come into the office today but developed n/v this morning. States she feels that she needs to be seen in person and was told she could get an appointment at the office. I offered her different dates and times and she is now scheduled to see Dr. Marlou Porch on Tuesday 5/19. I advised her to call back prior to that visit with questions or concerns. She thanked me for the call.

## 2019-02-07 NOTE — Telephone Encounter (Signed)
F/U Message            Patient is sick and wanted to cancel her appt. Should this appt. Be virtual? Patient states she was suppose to come in  Pls call to advise

## 2019-02-11 ENCOUNTER — Telehealth: Payer: Self-pay | Admitting: Licensed Clinical Social Worker

## 2019-02-11 NOTE — Telephone Encounter (Signed)
Patient returned call and reports she will be home for food delivery on Wednesday.  Patient informed of delivery time and no face to face contact during delivery. Patient verbalizes understanding and grateful for the assistance.  CSW continues to follow for supportive needs. Raquel Sarna, [place], [place]

## 2019-02-11 NOTE — Telephone Encounter (Signed)
CSW contacted patient to follow up on weekly food delivery package. Message left for return call as patient has an appointment at 11:20am and need to verify that someone will be home to get the food delivery. CSW continues to follow for supportive needs. Raquel Sarna, [place], [place]

## 2019-02-11 NOTE — Telephone Encounter (Signed)
Patient called to request assistance with transportation to [employer]. CSW will follow up with transportation option in the morning as cab company schedules the day of the appointment. Patient verbalizes understanding of pick up time. Raquel Sarna, [place], [place]

## 2019-02-12 ENCOUNTER — Other Ambulatory Visit: Payer: Self-pay

## 2019-02-12 ENCOUNTER — Ambulatory Visit (INDEPENDENT_AMBULATORY_CARE_PROVIDER_SITE_OTHER): Payer: Self-pay | Admitting: *Deleted

## 2019-02-12 ENCOUNTER — Telehealth: Payer: Medicaid Other | Admitting: Physician Assistant

## 2019-02-12 ENCOUNTER — Telehealth: Payer: Self-pay | Admitting: Licensed Clinical Social Worker

## 2019-02-12 ENCOUNTER — Encounter: Payer: Self-pay | Admitting: Cardiology

## 2019-02-12 ENCOUNTER — Ambulatory Visit (INDEPENDENT_AMBULATORY_CARE_PROVIDER_SITE_OTHER): Payer: Self-pay | Admitting: Cardiology

## 2019-02-12 VITALS — BP 132/74 | HR 116 | Ht 62.0 in | Wt 125.2 lb

## 2019-02-12 DIAGNOSIS — Z954 Presence of other heart-valve replacement: Secondary | ICD-10-CM

## 2019-02-12 DIAGNOSIS — Z7901 Long term (current) use of anticoagulants: Secondary | ICD-10-CM

## 2019-02-12 DIAGNOSIS — I471 Supraventricular tachycardia: Secondary | ICD-10-CM

## 2019-02-12 DIAGNOSIS — I440 Atrioventricular block, first degree: Secondary | ICD-10-CM

## 2019-02-12 DIAGNOSIS — I5032 Chronic diastolic (congestive) heart failure: Secondary | ICD-10-CM

## 2019-02-12 DIAGNOSIS — R0602 Shortness of breath: Secondary | ICD-10-CM

## 2019-02-12 LAB — POCT INR: INR: 3.1 — AB (ref 2.0–3.0)

## 2019-02-12 NOTE — Progress Notes (Signed)
Cardiology Office Note:    Date:  02/12/2019   ID:  Mary [name], DOB 07/29/1961, MRN [id number]  PCP:  Antony Blackbird, MD  Cardiologist:  Candee Furbish, MD  Electrophysiologist:  Cristopher Peru, MD   Referring MD: Antony Blackbird, MD     History of Present Illness:    Mary [name] is a 58 y.o. female here for complaint of left arm pain radiating up to her chest 1-2 times per week feels like a tingling sensation.  Can be 8 out of 10 in intensity.  Feels like she cannot breathe when she has the pain. Can not sleep flat. If lay flat heart goes crazy, races. Breathing rapid all the time. 1 year been happening, maybe longer.   She has a metallic mitral valve.  EKG was done on 12/05/2018-heart rate was 110, sinus tachycardia.  No other abnormalities.  She was recently on a telemedicine visit on 01/25/2019 with Dr. Lovena Le feeling short of breath and weak.  Has valvular heart disease chronic diastolic heart failure transient complete heart block not on medical therapy.  She was in the emergency department about a month and a half ago with nausea and vomiting feeling weak and short of breath.  To tell what her volume status was over the phone.  Since Dr. Saunders Revel is now in [place], she was asked to see me as DOD.  Previously used to see Dr. Aundra Dubin.  Has not been the same since surgery in 2014.   Past Medical History:  Diagnosis Date  . Acute diastolic heart failure ([place]) 03/15/2013  . Anemia   . Carotid stenosis    Carotid US 2/17: bilateral ICA 1-39% >> FU prn  . Childhood asthma   . Complete heart block ([place]) 05/28/2013   Post-op  . Epigastric hernia 200's  . GERD (gastroesophageal reflux disease)   . Heart murmur   . History of blood transfusion    "14 w/1st pregnancy; 2 w/last C-section" (03/15/2013)  . Hx of echocardiogram    a. Echo 4/16:  Mild focal basal septal hypertrophy, EF 60-65%, no RWMA, Mechanical MVR ok with mild central regurgitation and no perivalvular leak, small mobile  density attached to valvular ring (1.5x1 cm) - post surgical changes vs SBE, mild LAE;   b. Echo 2/17:  Mild post wall LVH, EF 55-60%, no RWMA, mechanical MVR ok (mean 5 mmHg), normal RVSF  . Hypertension    no pcp   will go to [facility]  . 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 S/P mitral valve replacement with metallic valve 1/44/8185   34mm Sorin Carbomedics mechanical prosthesis via right mini thoracotomy approach  . 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 Severe mitral regurgitation 04/22/2013  . Stroke ([place])   . SVT (supraventricular tachycardia) ([place]) 03/16/2013  . Tricuspid regurgitation     Past Surgical History:  Procedure Laterality Date  . APPENDECTOMY  [id number]  . CARDIAC CATHETERIZATION    . CARDIAC VALVE REPLACEMENT    . CESAREAN SECTION  1983  . CESAREAN SECTION WITH BILATERAL TUBAL LIGATION  1999  . FEMORAL HERNIA REPAIR Right 05/23/2013   Procedure: HERNIA REPAIR FEMORAL;  Surgeon: Rexene Alberts, MD;  Location: [place];  Service: Open Heart Surgery;  Laterality: Right;  . INTRAOPERATIVE TRANSESOPHAGEAL ECHOCARDIOGRAM N/A 05/23/2013   Procedure: INTRAOPERATIVE TRANSESOPHAGEAL ECHOCARDIOGRAM;  Surgeon: Valentina Gu  Roxy Manns, MD;  Location: [place];  Service: Open Heart Surgery;  Laterality: N/A;  . LAPAROSCOPIC CHOLECYSTECTOMY  2003  . LEFT AND RIGHT HEART CATHETERIZATION WITH CORONARY ANGIOGRAM N/A 03/22/2013   Procedure: LEFT AND RIGHT HEART CATHETERIZATION WITH CORONARY ANGIOGRAM;  Surgeon: Burnell Blanks, MD;  Location: [facility] CATH LAB;  Service: Cardiovascular;  Laterality: N/A;  . MINIMALLY INVASIVE TRICUSPID VALVE REPAIR Right 05/23/2013   Procedure: MINIMALLY INVASIVE TRICUSPID VALVE REPAIR;  Surgeon: Rexene Alberts, MD;  Location: [place];  Service: Open Heart Surgery;  Laterality: Right;   . MITRAL VALVE REPLACEMENT N/A 05/23/2013   Procedure: MITRAL VALVE (MV) REPLACEMENT;  Surgeon: Rexene Alberts, MD;  Location: [place];  Service: Open Heart Surgery;  Laterality: N/A;  . MULTIPLE EXTRACTIONS WITH ALVEOLOPLASTY N/A 04/04/2013   Procedure: Extraction of tooth #'s 1,8,9,13,14,15,23,24,25,26 with alveoloplasty and gross debridement of remaining teeth;  Surgeon: Lenn Cal, DDS;  Location: [place];  Service: Oral Surgery;  Laterality: N/A;  . TEE WITHOUT CARDIOVERSION N/A 03/18/2013   Procedure: TRANSESOPHAGEAL ECHOCARDIOGRAM (TEE);  Surgeon: Larey Dresser, MD;  Location: [place];  Service: Cardiovascular;  Laterality: N/A;  . TEE WITHOUT CARDIOVERSION N/A 06/17/2013   Procedure: TRANSESOPHAGEAL ECHOCARDIOGRAM (TEE);  Surgeon: Lelon Perla, MD;  Location: [facility] ENDOSCOPY;  Service: Cardiovascular;  Laterality: N/A;  . TUBAL LIGATION  1999    Current Medications: Current Meds  Medication Sig  . aspirin EC 81 MG tablet Take 1 tablet (81 mg total) by mouth daily.  . Hyprom-Naphaz-Polysorb-Zn Sulf (CLEAR EYES COMPLETE OP) Place 2 drops into both eyes daily as needed (red eyes).  . saccharomyces boulardii (FLORASTOR) 250 MG capsule Take 1 capsule (250 mg total) by mouth 2 (two) times daily.  [name] warfarin (COUMADIN) 5 MG tablet Take as directed by coumadin clinic  . WITCH HAZEL EX Apply 1 application topically at bedtime as needed (knee pain, bruise on arm).      Allergies:   Oxycodone   Social History   Socioeconomic History  . Marital status: Single    Spouse name: Not on file  . Number of children: Not on file  . Years of education: Not on file  . Highest education level: Not on file  Occupational History  . Not on file  Social Needs  . Financial resource strain: Not on file  . Food insecurity:    Worry: Not on file    Inability: Not on file  . Transportation needs:    Medical: Not on file    Non-medical: Not on file  Tobacco Use  . Smoking status: Former  Smoker    Packs/day: 0.50    Years: 36.00    Pack years: 18.00    Types: Cigarettes    Last attempt to quit: 03/15/2013    Years since quitting: 5.9  . Smokeless tobacco: Never Used  Substance and Sexual Activity  . Alcohol use: Yes    Comment: 01/11/2017  "glass of wine/month"  . Drug use: No  . Sexual activity: Never  Lifestyle  . Physical activity:    Days per week: Not on file    Minutes per session: Not on file  . Stress: Not on file  Relationships  . Social connections:    Talks on phone: Not on file    Gets together: Not on file    Attends religious service: Not on file    Active member of club or organization: Not on file    Attends meetings of clubs  or organizations: Not on file    Relationship status: Not on file  Other Topics Concern  . Not on file  Social History Narrative  . Not on file     Family History: The patient's family history includes Cancer in her mother; Sarcoidosis in her sister; Stroke in her father. There is no history of Heart attack.  ROS:   Please see the history of present illness.    Tingling in left arm, trouble breathing, no bleeding all other systems reviewed and are negative.  EKGs/Labs/Other Studies Reviewed:    The following studies were reviewed today:  Holter monitor 03/30/2018:  Normal sinus rhythm and sinus tachycardia with average heart rate 109 bpm. Heart rate ranged from 27 to 171 bpm.  Intermittent complete heart block with slow ventricular response at 47 bpm during the daytime.  SVT at 171 bpm  Occasional PVCs and wide-complex tachycardia up to 5 beats  - Left ventricle: The cavity size was normal. Systolic function was   normal. The estimated ejection fraction was in the range of 55%   to 60%. Wall motion was normal; there were no regional wall   motion abnormalities. The study is not technically sufficient to   allow evaluation of LV diastolic function. - Mitral valve: A mechanical prosthesis was present and  functioning   normally. Regurgitation could not be evaluated due to acoustic   artifact from the prosthesis. Peak gradient (D): 2 mm Hg. Valve   area by pressure half-time: 7.59 cm^2. Valve area by continuity   equation (using LVOT flow): 0.97 cm^2. - Tricuspid valve: TV ring prosthesis is present and functioning   normally. - Pulmonary arteries: Systolic pressure could not be accurately   estimated.  Impressions:  - Stable mechanical MVR and TV annuloplast ring. Gradients   unchanged from prior echo.  EKG:  EKG is not ordered today.  Prior EKGs have been reviewed, sinus tachycardia.  Recent Labs: 03/16/2018: TSH 3.000 05/31/2018: Magnesium 1.7 12/05/2018: ALT 18; Hemoglobin 13.3; Platelets 376 12/07/2018: BUN 10; Creatinine, Ser 0.83; Potassium 3.6; Sodium 137  Recent Lipid Panel    Component Value Date/Time   CHOL 189 05/31/2018 1108   TRIG 300 (H) 05/31/2018 1108   HDL 68 05/31/2018 1108   CHOLHDL 2.8 05/31/2018 1108   CHOLHDL 3.2 06/06/2016 0135   VLDL 22 06/06/2016 0135   LDLCALC 61 05/31/2018 1108    Physical Exam:    VS:  BP 132/74   Pulse (!) 116   Ht 5\' 2"  (1.575 m)   Wt 125 lb 3.2 oz (56.8 kg)   LMP 03/22/2013   SpO2 99%   BMI 22.90 kg/m     Wt Readings from Last 3 Encounters:  02/12/19 125 lb 3.2 oz (56.8 kg)  12/05/18 128 lb 1.4 oz (58.1 kg)  11/20/18 129 lb (58.5 kg)     GEN:  Well nourished, well developed in no acute distress HEENT: Normal NECK: No JVD; No carotid bruits LYMPHATICS: No lymphadenopathy CARDIAC: Tachycardic regular, no murmurs, rubs, gallops, S1 click mechanical mitral valve RESPIRATORY:  Clear to auscultation without rales, wheezing or rhonchi  ABDOMEN: Soft, non-tender, non-distended MUSCULOSKELETAL:  No edema; No deformity  SKIN: Warm and dry NEUROLOGIC:  Alert and oriented x 3 PSYCHIATRIC:  Normal affect   ASSESSMENT:    1. S/P mitral valve replacement with metallic valve   2. SOB (shortness of breath)   3. SVT  (supraventricular tachycardia) ([place])   4. Chronic diastolic heart failure ([place])   5.  First degree heart block    PLAN:    In order of problems listed above:  Shortness of breath - Longstanding issue.  This since 2018.  Prior x-ray done last year for the same reason was normal.  Hemoglobin 2 months ago was normal at 13.  Sinus tachycardia has been present for quite some time.  Last EF 1 year ago was normal.  I will go ahead and check another echo to make sure that she is not changed structurally. -Continue with walking, conditioning efforts -Prior catheterization in 2014 showed no obstructive coronary artery disease.  Mechanical mitral valve -At last check structurally normal.  Continue with Coumadin.  Her INR was recently slightly elevated, she is getting checked again today.  She is on aspirin.  Dental prophylaxis.  Sinus tachycardia/SVT/transient complete heart block - She has seen Dr. Lovena Le for this in the past.  Avoiding Toprol.  Prior monitor reviewed.  Once again reports that her symptoms are worse when laying down.  Arm discomfort - Could be neuropathic.  Seems on presentation for angina.  Prior catheterization reviewed.  Nonetheless, I am checking echocardiogram once again.     Medication Adjustments/Labs and Tests Ordered: Current medicines are reviewed at length with the patient today.  Concerns regarding medicines are outlined above.  Orders Placed This Encounter  Procedures  . ECHOCARDIOGRAM COMPLETE   No orders of the defined types were placed in this encounter.   Patient Instructions  Medication Instructions:  The current medical regimen is effective;  continue present plan and medications.  If you need a refill on your cardiac medications before your next appointment, please call your pharmacy.   Testing/Procedures: Your physician has requested that you have an echocardiogram. Echocardiography is a painless test that uses sound waves to create images of your  heart. It provides your doctor with information about the size and shape of your heart and how well your heart's chambers and valves are working. This procedure takes approximately one hour. There are no restrictions for this procedure.  Follow-Up: At [facility], you and your health needs are our priority.  As part of our continuing mission to provide you with exceptional heart care, we have created designated Provider Care Teams.  These Care Teams include your primary Cardiologist (physician) and Advanced Practice Providers (APPs -  Physician Assistants and Nurse Practitioners) who all work together to provide you with the care you need, when you need it. You will need a follow up appointment 6 months with Richardson Dopp, PA and in 12 months with Dr Marlou Porch.  Please call our office 2 months in advance to schedule this appointment.  You may see Candee Furbish, MD or one of the following Advanced Practice Providers on your designated Care Team:   Truitt Merle, NP Cecilie Kicks, NP . Kathyrn Drown, NP  Thank you for choosing [facility]!!         Signed, Candee Furbish, MD  02/12/2019 11:55 AM    [place]

## 2019-02-12 NOTE — Patient Instructions (Signed)
Description   Spoke with pt and instructed pt to continue taking 1 tablet everyday except 1.5 tablet on Mondays, Wednesdays and Fridays.  Recheck in 4 weeks. Continue eating the same amount of leafy green vegetables.  Main # [phone]

## 2019-02-12 NOTE — Patient Instructions (Signed)
Medication Instructions:  The current medical regimen is effective;  continue present plan and medications.  If you need a refill on your cardiac medications before your next appointment, please call your pharmacy.   Testing/Procedures: Your physician has requested that you have an echocardiogram. Echocardiography is a painless test that uses sound waves to create images of your heart. It provides your doctor with information about the size and shape of your heart and how well your heart's chambers and valves are working. This procedure takes approximately one hour. There are no restrictions for this procedure.  Follow-Up: At [facility], you and your health needs are our priority.  As part of our continuing mission to provide you with exceptional heart care, we have created designated Provider Care Teams.  These Care Teams include your primary Cardiologist (physician) and Advanced Practice Providers (APPs -  Physician Assistants and Nurse Practitioners) who all work together to provide you with the care you need, when you need it. You will need a follow up appointment 6 months with Richardson Dopp, PA and in 12 months with Dr Marlou Porch.  Please call our office 2 months in advance to schedule this appointment.  You may see Candee Furbish, MD or one of the following Advanced Practice Providers on your designated Care Team:   Truitt Merle, NP Cecilie Kicks, NP . Kathyrn Drown, NP  Thank you for choosing [facility]!!

## 2019-02-12 NOTE — Telephone Encounter (Signed)
CSW scheduled cab ride for patient to [facility] office for pick up at 10:30am. Message left for patient to inform of pick up time. Raquel Sarna, [place], [place]

## 2019-02-14 ENCOUNTER — Telehealth: Payer: Self-pay | Admitting: Licensed Clinical Social Worker

## 2019-02-14 NOTE — Telephone Encounter (Signed)
CSW contacted patient to follow up on weekly food delivery package. Patient informed of delivery time and no face to face contact during delivery. Message left as no answer.  CSW continues to follow for supportive needs. Jackie Adalyne Lovick, LCSW, CCSW-MCS [phone] 

## 2019-02-19 ENCOUNTER — Telehealth: Payer: Self-pay | Admitting: Licensed Clinical Social Worker

## 2019-02-19 NOTE — Telephone Encounter (Signed)
Patient called to ask for assistance with medications. She states she has [employer] which does not cover medications. Patient does not have the monies to purchase meds at [facility]. CSW discussed possibly looking into getting her on the [place] and obtaining a blue pharmacy card. In the meantime, CSW provided patient with a [facility] gift card to cover cost of medications. Patient grateful for the support during this time. CSW will follow up with [facility] and see about blue pharmacy card. Raquel Sarna, [place], [place]

## 2019-02-20 ENCOUNTER — Other Ambulatory Visit: Payer: Self-pay | Admitting: Cardiology

## 2019-02-20 ENCOUNTER — Telehealth: Payer: Self-pay | Admitting: Licensed Clinical Social Worker

## 2019-02-20 MED ORDER — WARFARIN SODIUM 5 MG PO TABS: ORAL_TABLET | ORAL | 0 refills | Status: DC

## 2019-02-20 NOTE — Telephone Encounter (Signed)
CSW received call from patient stating that her food has not arrived yet and she has to go to [facility] to pick up medications. CSW informed patient that food is out for delivery and should be there this afternoon. Patient will return call to [place] when food arrives. Raquel Sarna, [place], [place]

## 2019-02-20 NOTE — Telephone Encounter (Signed)
RX refill sent as requested.  

## 2019-02-20 NOTE — Telephone Encounter (Signed)
 *  STAT* If patient is at the pharmacy, call can be transferred to refill team.   1. Which medications need to be refilled? (please list name of each medication and dose if known) warfarin (COUMADIN) 5 MG tablet  2. Which pharmacy/location (including street and city if local pharmacy) is medication to be sent to? [facility] on [address]  3. Do they need a 30 day or 90 day supply? 30 day  Patient is completely out of medicine

## 2019-02-21 ENCOUNTER — Telehealth: Payer: Self-pay | Admitting: Licensed Clinical Social Worker

## 2019-02-21 NOTE — Telephone Encounter (Signed)
CSW left message for patient to call [facility] to make appointment for financial counseling at [phone] to apply for the blue card.  CSW available as needed. Raquel Sarna, [place], [place]

## 2019-02-25 ENCOUNTER — Telehealth: Payer: Self-pay | Admitting: Licensed Clinical Social Worker

## 2019-02-25 NOTE — Telephone Encounter (Signed)
CSW contacted patient to follow up on weekly food delivery package. Patient informed of delivery time and no face to face contact during delivery. CSW shared transition option for food delivery as the Covid 19 Food relief program will be ending on March 08, 2019. Message left as no answer.  CSW continues to follow for supportive needs. Mary Shianne Zeiser, LCSW, CCSW-MCS [phone] 

## 2019-03-04 ENCOUNTER — Telehealth: Payer: Self-pay | Admitting: Licensed Clinical Social Worker

## 2019-03-04 NOTE — Telephone Encounter (Signed)
CSW contacted patient to follow up on weekly food delivery package. Patient informed of delivery time and no face to face contact during delivery. CSW shared transition option for food delivery as the Covid 19 Food relief program will be ending on March 06, 2019. Message left as no answer.  CSW continues to follow for supportive needs. Raquel Sarna, [place] [employer], [place]

## 2019-03-04 NOTE — Telephone Encounter (Signed)
Patient called back to confirm she received message about the food delivery on Wednesday. Patient also mentioned that she needed the follow up information again on the blue card. CSW provided contact info for [facility] and to make an appointment with the financial counselor. Patient grateful for the assistance and will follow up. Raquel Sarna, [place], [place]

## 2019-03-05 ENCOUNTER — Telehealth (HOSPITAL_COMMUNITY): Payer: Self-pay

## 2019-03-05 NOTE — Telephone Encounter (Signed)
Left message to confirm appointment instructions for echocardiogram.

## 2019-03-06 ENCOUNTER — Other Ambulatory Visit: Payer: Self-pay

## 2019-03-06 ENCOUNTER — Ambulatory Visit (INDEPENDENT_AMBULATORY_CARE_PROVIDER_SITE_OTHER): Payer: Self-pay | Admitting: Pharmacist

## 2019-03-06 ENCOUNTER — Ambulatory Visit (HOSPITAL_COMMUNITY): Payer: Self-pay | Attending: Cardiology

## 2019-03-06 DIAGNOSIS — R0602 Shortness of breath: Secondary | ICD-10-CM | POA: Insufficient documentation

## 2019-03-06 DIAGNOSIS — Z7901 Long term (current) use of anticoagulants: Secondary | ICD-10-CM

## 2019-03-06 DIAGNOSIS — Z954 Presence of other heart-valve replacement: Secondary | ICD-10-CM | POA: Insufficient documentation

## 2019-03-06 LAB — POCT INR: INR: 4.5 — AB (ref 2.0–3.0)

## 2019-03-06 NOTE — Patient Instructions (Signed)
Description   No warfarin today then continue taking 1 tablet everyday except 1.5 tablet on Mondays, Wednesdays and Fridays.  Recheck in 3 weeks. Continue eating the same amount of leafy green vegetables.  Main # [phone]

## 2019-03-20 ENCOUNTER — Telehealth: Payer: Self-pay

## 2019-03-20 NOTE — Telephone Encounter (Signed)

## 2019-03-27 ENCOUNTER — Encounter (HOSPITAL_COMMUNITY): Payer: Self-pay | Admitting: Emergency Medicine

## 2019-03-27 ENCOUNTER — Emergency Department (HOSPITAL_COMMUNITY): Payer: Self-pay

## 2019-03-27 ENCOUNTER — Inpatient Hospital Stay (HOSPITAL_COMMUNITY)
Admission: EM | Admit: 2019-03-27 | Discharge: 2019-04-04 | DRG: 312 | Disposition: A | Discharge status: Home / Self Care | Payer: Self-pay | Attending: Internal Medicine | Admitting: Internal Medicine

## 2019-03-27 DIAGNOSIS — Z885 Allergy status to narcotic agent status: Secondary | ICD-10-CM

## 2019-03-27 DIAGNOSIS — G629 Polyneuropathy, unspecified: Secondary | ICD-10-CM | POA: Diagnosis present

## 2019-03-27 DIAGNOSIS — Z823 Family history of stroke: Secondary | ICD-10-CM

## 2019-03-27 DIAGNOSIS — Z20828 Contact with and (suspected) exposure to other viral communicable diseases: Secondary | ICD-10-CM | POA: Diagnosis present

## 2019-03-27 DIAGNOSIS — Z952 Presence of prosthetic heart valve: Secondary | ICD-10-CM

## 2019-03-27 DIAGNOSIS — I110 Hypertensive heart disease with heart failure: Secondary | ICD-10-CM | POA: Diagnosis present

## 2019-03-27 DIAGNOSIS — R519 Headache, unspecified: Secondary | ICD-10-CM

## 2019-03-27 DIAGNOSIS — Z9889 Other specified postprocedural states: Secondary | ICD-10-CM

## 2019-03-27 DIAGNOSIS — R002 Palpitations: Secondary | ICD-10-CM

## 2019-03-27 DIAGNOSIS — Z7901 Long term (current) use of anticoagulants: Secondary | ICD-10-CM

## 2019-03-27 DIAGNOSIS — K219 Gastro-esophageal reflux disease without esophagitis: Secondary | ICD-10-CM | POA: Diagnosis present

## 2019-03-27 DIAGNOSIS — R05 Cough: Secondary | ICD-10-CM | POA: Diagnosis present

## 2019-03-27 DIAGNOSIS — R55 Syncope and collapse: Principal | ICD-10-CM | POA: Diagnosis present

## 2019-03-27 DIAGNOSIS — Z87891 Personal history of nicotine dependence: Secondary | ICD-10-CM

## 2019-03-27 DIAGNOSIS — Z8679 Personal history of other diseases of the circulatory system: Secondary | ICD-10-CM

## 2019-03-27 DIAGNOSIS — D539 Nutritional anemia, unspecified: Secondary | ICD-10-CM

## 2019-03-27 DIAGNOSIS — R112 Nausea with vomiting, unspecified: Secondary | ICD-10-CM | POA: Diagnosis present

## 2019-03-27 DIAGNOSIS — D519 Vitamin B12 deficiency anemia, unspecified: Secondary | ICD-10-CM

## 2019-03-27 DIAGNOSIS — I6523 Occlusion and stenosis of bilateral carotid arteries: Secondary | ICD-10-CM | POA: Diagnosis present

## 2019-03-27 DIAGNOSIS — M542 Cervicalgia: Secondary | ICD-10-CM

## 2019-03-27 DIAGNOSIS — I5032 Chronic diastolic (congestive) heart failure: Secondary | ICD-10-CM | POA: Diagnosis present

## 2019-03-27 DIAGNOSIS — Z8673 Personal history of transient ischemic attack (TIA), and cerebral infarction without residual deficits: Secondary | ICD-10-CM

## 2019-03-27 DIAGNOSIS — D513 Other dietary vitamin B12 deficiency anemia: Secondary | ICD-10-CM | POA: Diagnosis present

## 2019-03-27 DIAGNOSIS — Z7982 Long term (current) use of aspirin: Secondary | ICD-10-CM

## 2019-03-27 DIAGNOSIS — Z9049 Acquired absence of other specified parts of digestive tract: Secondary | ICD-10-CM

## 2019-03-27 DIAGNOSIS — R791 Abnormal coagulation profile: Secondary | ICD-10-CM | POA: Diagnosis present

## 2019-03-27 DIAGNOSIS — E871 Hypo-osmolality and hyponatremia: Secondary | ICD-10-CM | POA: Diagnosis present

## 2019-03-27 DIAGNOSIS — I442 Atrioventricular block, complete: Secondary | ICD-10-CM

## 2019-03-27 DIAGNOSIS — Z954 Presence of other heart-valve replacement: Secondary | ICD-10-CM

## 2019-03-27 DIAGNOSIS — Z9851 Tubal ligation status: Secondary | ICD-10-CM

## 2019-03-27 DIAGNOSIS — I495 Sick sinus syndrome: Secondary | ICD-10-CM | POA: Diagnosis present

## 2019-03-27 DIAGNOSIS — W19XXXA Unspecified fall, initial encounter: Secondary | ICD-10-CM | POA: Diagnosis present

## 2019-03-27 DIAGNOSIS — I472 Ventricular tachycardia: Secondary | ICD-10-CM | POA: Diagnosis not present

## 2019-03-27 DIAGNOSIS — Z79899 Other long term (current) drug therapy: Secondary | ICD-10-CM

## 2019-03-27 NOTE — ED Triage Notes (Signed)
Patient here from home with complaints of headache x2 days. AAO x4. Patient is on blood thinners. Ambulatory. CBG 97.

## 2019-03-27 NOTE — ED Provider Notes (Addendum)
[facility] DEPT Provider Note   CSN: [id number] Arrival date & time: 03/27/19  2245    History   Chief Complaint Chief Complaint  Patient presents with   Headache    HPI Mary [name] is a 58 y.o. female with a PMH of SVT, Anemia, HTN, CVA, CHF, Tricuspid and Mitral valve replacement with metallic valve on Coumadin presenting after a fall at 9pm today. Patient reports she was having palpitations when she reached to call 911 and felt lightheaded. Patient reports she fell and a syncopal episode. Patient is unsure if she hit her head. Patient reports a constant posterior headache since the incident. Patient reports mild neck pain. Patient denies any bleeding or wounds. Patient states she has episodes of palpitations intermittently. Patient reports a dry cough. Patient denies shortness of breath or chest pain. Patient reports generalized weakness. Patient denies vision changes, numbness, or dizziness. Patient denies nausea, vomiting, diarrhea, or abdominal pain. Patient denies fever, sick contacts, or recent travel.      HPI  Past Medical History:  Diagnosis Date   Acute diastolic heart failure ([place]) 03/15/2013   Anemia    Carotid stenosis    Carotid US 2/17: bilateral ICA 1-39% >> FU prn   Childhood asthma    Complete heart block ([place]) 05/28/2013   Post-op   Epigastric hernia 200's   GERD (gastroesophageal reflux disease)    Heart murmur    History of blood transfusion    "14 w/1st pregnancy; 2 w/last C-section" (03/15/2013)   Hx of echocardiogram    a. Echo 4/16:  Mild focal basal septal hypertrophy, EF 60-65%, no RWMA, Mechanical MVR ok with mild central regurgitation and no perivalvular leak, small mobile density attached to valvular ring (1.5x1 cm) - post surgical changes vs SBE, mild LAE;   b. 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   Complex valvuloplasty including Cor-matrix ECM patch augmentation of anterior and lateral leaflets with 17mm Edwards mc3 ring annuloplasty via right mini-thoracotomy approach   Severe mitral regurgitation 04/22/2013   Stroke ([place])    SVT (supraventricular tachycardia) ([place]) 03/16/2013   Tricuspid regurgitation     Patient Active Problem List   Diagnosis Date Noted   Dehydration    Volume depletion 05/24/2018   Acute kidney injury ([place]) 05/23/2018   Gastroenteritis 05/23/2018   Hyponatremia 04/18/2017   Orthostatic hypotension 01/11/2017   Chronic hyponatremia 01/11/2017   Elevated liver enzymes 01/11/2017   Atypical chest pain 06/05/2016   Palpitations 03/20/2016   Sinus tachycardia 02/01/2016   AKI (acute kidney injury) ([place]) 01/10/2016   Subtherapeutic international normalized ratio (INR) 01/10/2016   TIA (transient ischemic attack) 09/13/2015   History of rheumatic heart disease 09/13/2015   CVA (cerebral vascular accident) ([place]) 09/12/2015   SOB (shortness of breath) 02/24/2014   First degree heart block 06/17/2013   Weakness 06/16/2013   (HFpEF) heart failure with preserved ejection fraction ([place]) 06/16/2013   Anemia 06/16/2013   History of bacterial endocarditis 06/16/2013   Heart valve replaced by other means 06/10/2013   Long term (current) use of anticoagulants 06/10/2013   History of complete  heart block 05/28/2013   H/O heart valve replacement with mechanical valve 05/23/2013   S/P minimally-invasive tricuspid valve repair 05/23/2013   Femoral hernia 05/23/2013   Mitral valve insufficiency 04/22/2013   Mitral valve disorders(424.0) 04/22/2013   Tricuspid regurgitation 04/22/2013   Rheumatic  mitral stenosis with regurgitation 03/23/2013   SVT (supraventricular tachycardia) ([place]) 03/16/2013    Past Surgical History:  Procedure Laterality Date   APPENDECTOMY  ~1978   CARDIAC CATHETERIZATION     CARDIAC VALVE REPLACEMENT     CESAREAN SECTION  1983   CESAREAN SECTION WITH BILATERAL TUBAL LIGATION  1999   FEMORAL HERNIA REPAIR Right 05/23/2013   Procedure: HERNIA REPAIR FEMORAL;  Surgeon: Rexene Alberts, MD;  Location: [facility];  Service: Open Heart Surgery;  Laterality: Right;   INTRAOPERATIVE TRANSESOPHAGEAL ECHOCARDIOGRAM N/A 05/23/2013   Procedure: INTRAOPERATIVE TRANSESOPHAGEAL ECHOCARDIOGRAM;  Surgeon: Rexene Alberts, MD;  Location: [facility];  Service: Open Heart Surgery;  Laterality: N/A;   LAPAROSCOPIC CHOLECYSTECTOMY  2003   LEFT AND RIGHT HEART CATHETERIZATION WITH CORONARY ANGIOGRAM N/A 03/22/2013   Procedure: LEFT AND RIGHT HEART CATHETERIZATION WITH CORONARY ANGIOGRAM;  Surgeon: Burnell Blanks, MD;  Location: [facility] CATH LAB;  Service: Cardiovascular;  Laterality: N/A;   MINIMALLY INVASIVE TRICUSPID VALVE REPAIR Right 05/23/2013   Procedure: MINIMALLY INVASIVE TRICUSPID VALVE REPAIR;  Surgeon: Rexene Alberts, MD;  Location: [facility];  Service: Open Heart Surgery;  Laterality: Right;   MITRAL VALVE REPLACEMENT N/A 05/23/2013   Procedure: MITRAL VALVE (MV) REPLACEMENT;  Surgeon: Rexene Alberts, MD;  Location: [facility];  Service: Open Heart Surgery;  Laterality: N/A;   MULTIPLE EXTRACTIONS WITH ALVEOLOPLASTY N/A 04/04/2013   Procedure: Extraction of tooth #'s 1,8,9,13,14,15,23,24,25,26 with alveoloplasty and gross debridement of remaining teeth;  Surgeon: Lenn Cal, DDS;  Location: [facility];  Service: Oral Surgery;  Laterality: N/A;   TEE WITHOUT CARDIOVERSION N/A 03/18/2013   Procedure: TRANSESOPHAGEAL ECHOCARDIOGRAM (TEE);  Surgeon: Larey Dresser, MD;  Location: [facility];  Service: Cardiovascular;  Laterality: N/A;   TEE WITHOUT CARDIOVERSION N/A 06/17/2013    Procedure: TRANSESOPHAGEAL ECHOCARDIOGRAM (TEE);  Surgeon: Lelon Perla, MD;  [facility];  Service: Cardiovascular;  Laterality: N/A;   TUBAL LIGATION  1999     OB History   No obstetric history on file.      Home Medications    Prior to Admission medications   Medication Sig Start Date End Date Taking? Authorizing Provider  aspirin EC 81 MG tablet Take 1 tablet (81 mg total) by mouth daily. 08/26/13  Yes Larey Dresser, MD  fluticasone [facility]) 50 MCG/ACT nasal spray Place 1 spray into both nostrils daily as needed for allergies or rhinitis.   Yes [provider]  Hyprom-Naphaz-Polysorb-Zn Sulf (CLEAR EYES COMPLETE OP) Place 2 drops into both eyes daily as needed (red eyes).   Yes [provider]  saccharomyces boulardii (FLORASTOR) 250 MG capsule Take 1 capsule (250 mg total) by mouth 2 (two) times daily. 12/07/18  Yes Georgette Shell, MD  warfarin (COUMADIN) 5 MG tablet Take as directed by coumadin clinic Patient taking differently: Take 5-7.5 mg by mouth daily. M,W,and f 7.5 mg tablet. Tue, Thursday and Saturday and Sunday 5 mg 02/20/19  Yes Evans Lance, MD    Family History Family History  Problem Relation Age of Onset   Cancer Mother    Stroke Father    Sarcoidosis Sister    Heart attack Neg Hx     Social  History Social History   Tobacco Use   Smoking status: Former Smoker    Packs/day: 0.50    Years: 36.00    Pack years: 18.00    Types: Cigarettes    Quit date: 03/15/2013    Years since quitting: 6.0   Smokeless tobacco: Never Used  Substance Use Topics   Alcohol use: Yes    Comment: 01/11/2017  "glass of wine/month"   Drug use: No     Allergies   Oxycodone   Review of Systems Review of Systems  Constitutional: Negative for activity change, appetite change, chills, diaphoresis, fatigue, fever and unexpected weight change.  HENT: Negative for congestion, rhinorrhea and sore throat.   Eyes: Negative  for photophobia and visual disturbance.  Respiratory: Negative for cough, chest tightness and shortness of breath.   Cardiovascular: Positive for palpitations. Negative for chest pain and leg swelling.  Gastrointestinal: Negative for abdominal pain, blood in stool, diarrhea, nausea and vomiting.  Endocrine: Negative for cold intolerance and heat intolerance.  Genitourinary: Negative for dysuria and frequency.  Musculoskeletal: Positive for neck pain. Negative for arthralgias, back pain, gait problem, joint swelling, myalgias and neck stiffness.  Skin: Negative for wound.  Neurological: Positive for weakness, light-headedness and headaches. Negative for dizziness, tremors, seizures, syncope, facial asymmetry, speech difficulty and numbness.  Hematological: Bruises/bleeds easily.  Psychiatric/Behavioral: Negative for confusion, dysphoric mood and sleep disturbance. The patient is not nervous/anxious.     Physical Exam Updated Vital Signs BP 109/80 (BP Location: Left Arm)    Pulse (!) 108    Temp 98.3 F (36.8 C) (Oral)    Resp 19    LMP 03/22/2013    SpO2 99%   Physical Exam Vitals signs and nursing note reviewed.  Constitutional:      General: She is not in acute distress.    Appearance: She is well-developed. She is not diaphoretic.  HENT:     Head: Normocephalic. No raccoon eyes, Battle's sign, abrasion, contusion, masses or laceration.     Right Ear: Hearing, tympanic membrane, ear canal and external ear normal. No hemotympanum.     Left Ear: Hearing, tympanic membrane, ear canal and external ear normal. No hemotympanum.     Mouth/Throat:     Mouth: Mucous membranes are moist.     Pharynx: Oropharynx is clear.  Eyes:     General: No scleral icterus.    Extraocular Movements: Extraocular movements intact.     Right eye: Normal extraocular motion and no nystagmus.     Left eye: Normal extraocular motion and no nystagmus.     Pupils: Pupils are equal, round, and reactive to light.  Pupils are equal.     Right eye: Pupil is round and reactive.     Left eye: Pupil is round and reactive.  Cardiovascular:     Rate and Rhythm: Regular rhythm. Tachycardia present.     Heart sounds: Normal heart sounds. No murmur. No friction rub. No gallop.   Pulmonary:     Effort: Pulmonary effort is normal. No respiratory distress.     Breath sounds: Normal breath sounds. No wheezing or rales.  Abdominal:     Palpations: Abdomen is soft.     Tenderness: There is no abdominal tenderness.  Musculoskeletal:     Cervical back: She exhibits decreased range of motion, tenderness and bony tenderness. She exhibits no swelling, no edema and no deformity.     Thoracic back: Normal. She exhibits normal range of motion, no tenderness, no bony tenderness  and no swelling.     Lumbar back: Normal. She exhibits normal range of motion, no tenderness, no bony tenderness and no swelling.     Comments: No skin changes noted. Decreased ROM of cervical spine due to pain. Midline and paraspinal cervical tenderness to palpation. 2+ radial pulses. Sensation intact. 5/5 strength in upper extremities.   Skin:    General: Skin is warm.     Findings: No erythema or rash.  Neurological:     Mental Status: She is alert and oriented to person, place, and time.    Mental Status:  Alert, oriented, thought content appropriate, able to give a coherent history. Speech fluent without evidence of aphasia. Able to follow 2 step commands without difficulty.  Cranial Nerves:  II:  Peripheral visual fields grossly normal, pupils equal, round, reactive to light III,IV, VI: ptosis not present, extra-ocular motions intact bilaterally  V,VII: smile symmetric, facial light touch sensation equal VIII: hearing grossly normal to voice  IX,X: symmetric elevation of soft palate, uvula elevates symmetrically  XI: bilateral shoulder shrug symmetric and strong XII: midline tongue extension without fassiculations Motor:  Normal tone.  5/5 in upper and lower extremities bilaterally including strong and equal grip strength and dorsiflexion/plantar flexion Sensory: Pinprick and light touch normal in all extremities.  Deep Tendon Reflexes: 2+ and symmetric in the biceps and patella Cerebellar: normal finger-to-nose with bilateral upper extremities Negative pronator drift. Negative Romberg sign. CV: distal pulses palpable throughout    ED Treatments / Results  Labs (all labs ordered are listed, but only abnormal results are displayed) Labs Reviewed  CBC WITH DIFFERENTIAL/PLATELET  COMPREHENSIVE METABOLIC PANEL  URINALYSIS, ROUTINE W REFLEX MICROSCOPIC  PROTIME-INR  CBG MONITORING, ED    EKG None  Radiology Ct Head Wo Contrast  Result Date: 03/28/2019 CLINICAL DATA:  Headache.  Fall. EXAM: CT HEAD WITHOUT CONTRAST CT CERVICAL SPINE WITHOUT CONTRAST TECHNIQUE: Multidetector CT imaging of the head and cervical spine was performed following the standard protocol without intravenous contrast. Multiplanar CT image reconstructions of the cervical spine were also generated. COMPARISON:  12/19/2016 FINDINGS: CT HEAD FINDINGS Brain: No acute intracranial abnormality. Specifically, no hemorrhage, hydrocephalus, mass lesion, acute infarction, or significant intracranial injury. Vascular: No hyperdense vessel or unexpected calcification. Skull: No acute calvarial abnormality. Sinuses/Orbits: Visualized paranasal sinuses and mastoids clear. Orbital soft tissues unremarkable. Other: None CT CERVICAL SPINE FINDINGS Alignment: Normal Skull base and vertebrae: No acute fracture. No primary bone lesion or focal pathologic process. Soft tissues and spinal canal: No prevertebral fluid or swelling. No visible canal hematoma. Disc levels:  Normal Upper chest: Normal Other: None IMPRESSION: No acute intracranial abnormality. No acute bony abnormality in the cervical spine. Electronically Signed   By: Rolm Baptise M.D.   On: 03/28/2019 00:19   Ct  Cervical Spine Wo Contrast  Result Date: 03/28/2019 CLINICAL DATA:  Headache.  Fall. EXAM: CT HEAD WITHOUT CONTRAST CT CERVICAL SPINE WITHOUT CONTRAST TECHNIQUE: Multidetector CT imaging of the head and cervical spine was performed following the standard protocol without intravenous contrast. Multiplanar CT image reconstructions of the cervical spine were also generated. COMPARISON:  12/19/2016 FINDINGS: CT HEAD FINDINGS Brain: No acute intracranial abnormality. Specifically, no hemorrhage, hydrocephalus, mass lesion, acute infarction, or significant intracranial injury. Vascular: No hyperdense vessel or unexpected calcification. Skull: No acute calvarial abnormality. Sinuses/Orbits: Visualized paranasal sinuses and mastoids clear. Orbital soft tissues unremarkable. Other: None CT CERVICAL SPINE FINDINGS Alignment: Normal Skull base and vertebrae: No acute fracture. No primary bone lesion or focal  pathologic process. Soft tissues and spinal canal: No prevertebral fluid or swelling. No visible canal hematoma. Disc levels:  Normal Upper chest: Normal Other: None IMPRESSION: No acute intracranial abnormality. No acute bony abnormality in the cervical spine. Electronically Signed   By: Rolm Baptise M.D.   On: 03/28/2019 00:19   Dg Chest Port 1 View  Result Date: 03/28/2019 CLINICAL DATA:  Cough EXAM: PORTABLE CHEST 1 VIEW COMPARISON:  05/24/2018 FINDINGS: No focal opacity or pleural effusion. Stable cardiomediastinal silhouette. Valve prostheses. No pneumothorax. IMPRESSION: No active disease. Electronically Signed   By: Donavan Foil M.D.   On: 03/28/2019 00:40    Procedures Procedures (including critical care time)  Medications Ordered in ED Medications  metoCLOPramide (REGLAN) injection 10 mg (10 mg Intravenous Refused 03/28/19 0055)     Initial Impression / Assessment and Plan / ED Course  I have reviewed the triage vital signs and the nursing notes.  Pertinent labs & imaging results that were  available during my care of the patient were reviewed by me and considered in my medical decision making (see chart for details).  Clinical Course as of Mar 27 58  Thu Mar 28, 2019  0025 No acute intracranial abnormality. No acute bony abnormality in the cervical spine.    CT Head Wo Contrast [AH]    Clinical Course User Index [AH] Arville Lime, PA-C      Patient presents after a fall. Patient had a syncopal episode and palpitations. Patient has had a headache after the fall and is on coumadin. Neurological exam is normal at this time. Ordered Reglan for symptoms. Patient is refusing medications at this time. Head CT reveals no acute intracranial abnormality. No acute bony abnormality noted in cervical spine. Labs are pending. EKG is pending.   At shift change care was transferred to Lorre Munroe, PA-C who will follow pending studies, re-evaluate and determine disposition.   Final Clinical Impressions(s) / ED Diagnoses   Final diagnoses:  Fall, initial encounter  Syncope, unspecified syncope type  Bad headache  Neck pain    ED Discharge Orders    None       Arville Lime, PA-C 03/28/19 0101    Arville Lime, PA-C 03/28/19 0104    Ripley Fraise, MD 03/28/19 [phone]

## 2019-03-28 ENCOUNTER — Other Ambulatory Visit: Payer: Self-pay

## 2019-03-28 ENCOUNTER — Encounter (HOSPITAL_COMMUNITY): Payer: Self-pay | Admitting: Internal Medicine

## 2019-03-28 DIAGNOSIS — Z7901 Long term (current) use of anticoagulants: Secondary | ICD-10-CM

## 2019-03-28 DIAGNOSIS — R55 Syncope and collapse: Secondary | ICD-10-CM | POA: Diagnosis present

## 2019-03-28 DIAGNOSIS — I495 Sick sinus syndrome: Secondary | ICD-10-CM

## 2019-03-28 DIAGNOSIS — I442 Atrioventricular block, complete: Secondary | ICD-10-CM

## 2019-03-28 LAB — CBC WITH DIFFERENTIAL/PLATELET
Abs Immature Granulocytes: 0.01 10*3/uL (ref 0.00–0.07)
Abs Immature Granulocytes: 0.03 10*3/uL (ref 0.00–0.07)
Basophils Absolute: 0 10*3/uL (ref 0.0–0.1)
Basophils Absolute: 0 10*3/uL (ref 0.0–0.1)
Basophils Relative: 1 %
Basophils Relative: 1 %
Eosinophils Absolute: 0.3 10*3/uL (ref 0.0–0.5)
Eosinophils Absolute: 0.3 10*3/uL (ref 0.0–0.5)
Eosinophils Relative: 5 %
Eosinophils Relative: 4 %
HCT: 35.6 % — ABNORMAL LOW (ref 36.0–46.0)
HCT: 35 % — ABNORMAL LOW (ref 36.0–46.0)
Hemoglobin: 12.2 g/dL (ref 12.0–15.0)
Hemoglobin: 12.1 g/dL (ref 12.0–15.0)
Immature Granulocytes: 0 %
Immature Granulocytes: 0 %
Lymphocytes Relative: 28 %
Lymphocytes Relative: 16 %
Lymphs Abs: 1.7 10*3/uL (ref 0.7–4.0)
Lymphs Abs: 1.4 10*3/uL (ref 0.7–4.0)
MCH: 37.5 pg — ABNORMAL HIGH (ref 26.0–34.0)
MCH: 38.5 pg — ABNORMAL HIGH (ref 26.0–34.0)
MCHC: 34.3 g/dL (ref 30.0–36.0)
MCHC: 34.6 g/dL (ref 30.0–36.0)
MCV: 109.5 fL — ABNORMAL HIGH (ref 80.0–100.0)
MCV: 111.5 fL — ABNORMAL HIGH (ref 80.0–100.0)
Monocytes Absolute: 0.5 10*3/uL (ref 0.1–1.0)
Monocytes Absolute: 0.7 10*3/uL (ref 0.1–1.0)
Monocytes Relative: 9 %
Monocytes Relative: 8 %
Neutro Abs: 3.5 10*3/uL (ref 1.7–7.7)
Neutro Abs: 6.2 10*3/uL (ref 1.7–7.7)
Neutrophils Relative %: 57 %
Neutrophils Relative %: 71 %
Platelets: 415 10*3/uL — ABNORMAL HIGH (ref 150–400)
Platelets: 373 10*3/uL (ref 150–400)
RBC: 3.25 MIL/uL — ABNORMAL LOW (ref 3.87–5.11)
RBC: 3.14 MIL/uL — ABNORMAL LOW (ref 3.87–5.11)
RDW: 15.2 % (ref 11.5–15.5)
RDW: 15.6 % — ABNORMAL HIGH (ref 11.5–15.5)
WBC: 6.1 10*3/uL (ref 4.0–10.5)
WBC: 8.7 10*3/uL (ref 4.0–10.5)
nRBC: 0 % (ref 0.0–0.2)
nRBC: 0 % (ref 0.0–0.2)

## 2019-03-28 LAB — URINALYSIS, ROUTINE W REFLEX MICROSCOPIC
Bilirubin Urine: NEGATIVE
Glucose, UA: NEGATIVE mg/dL
Ketones, ur: NEGATIVE mg/dL
Leukocytes,Ua: NEGATIVE
Nitrite: NEGATIVE
Protein, ur: NEGATIVE mg/dL
Specific Gravity, Urine: 1.003 — ABNORMAL LOW (ref 1.005–1.030)
pH: 6 (ref 5.0–8.0)

## 2019-03-28 LAB — COMPREHENSIVE METABOLIC PANEL
ALT: 18 U/L (ref 0–44)
ALT: 17 U/L (ref 0–44)
AST: 35 U/L (ref 15–41)
AST: 35 U/L (ref 15–41)
Albumin: 3 g/dL — ABNORMAL LOW (ref 3.5–5.0)
Albumin: 3 g/dL — ABNORMAL LOW (ref 3.5–5.0)
Alkaline Phosphatase: 132 U/L — ABNORMAL HIGH (ref 38–126)
Alkaline Phosphatase: 139 U/L — ABNORMAL HIGH (ref 38–126)
Anion gap: 10 (ref 5–15)
Anion gap: 11 (ref 5–15)
BUN: <5 mg/dL — ABNORMAL LOW (ref 6–20)
BUN: 6 mg/dL (ref 6–20)
CO2: 22 mmol/L (ref 22–32)
CO2: 23 mmol/L (ref 22–32)
Calcium: 8.4 mg/dL — ABNORMAL LOW (ref 8.9–10.3)
Calcium: 8.6 mg/dL — ABNORMAL LOW (ref 8.9–10.3)
Chloride: 100 mmol/L (ref 98–111)
Chloride: 100 mmol/L (ref 98–111)
Creatinine, Ser: 0.73 mg/dL (ref 0.44–1.00)
Creatinine, Ser: 0.87 mg/dL (ref 0.44–1.00)
GFR calc Af Amer: >60 mL/min (ref >60)
GFR calc Af Amer: >60 mL/min (ref >60)
GFR calc non Af Amer: >60 mL/min (ref >60)
GFR calc non Af Amer: >60 mL/min (ref >60)
Glucose, Bld: 93 mg/dL (ref 70–99)
Glucose, Bld: 103 mg/dL — ABNORMAL HIGH (ref 70–99)
Potassium: 4.2 mmol/L (ref 3.5–5.1)
Potassium: 4.5 mmol/L (ref 3.5–5.1)
Sodium: 132 mmol/L — ABNORMAL LOW (ref 135–145)
Sodium: 134 mmol/L — ABNORMAL LOW (ref 135–145)
Total Bilirubin: 0.3 mg/dL (ref 0.3–1.2)
Total Bilirubin: <0.1 mg/dL — ABNORMAL LOW (ref 0.3–1.2)
Total Protein: 6.6 g/dL (ref 6.5–8.1)
Total Protein: 6.4 g/dL — ABNORMAL LOW (ref 6.5–8.1)

## 2019-03-28 LAB — SARS CORONAVIRUS 2 BY RT PCR (HOSPITAL ORDER, PERFORMED IN ~~LOC~~ HOSPITAL LAB): SARS Coronavirus 2: NEGATIVE

## 2019-03-28 LAB — PROTIME-INR
INR: 3.5 — ABNORMAL HIGH (ref 0.8–1.2)
Prothrombin Time: 34.6 seconds — ABNORMAL HIGH (ref 11.4–15.2)

## 2019-03-28 LAB — T4, FREE: Free T4: 0.69 ng/dL (ref 0.61–1.12)

## 2019-03-28 LAB — TSH: TSH: 2.481 u[IU]/mL (ref 0.350–4.500)

## 2019-03-28 MED ORDER — WARFARIN SODIUM 5 MG PO TABS: 5.0000 mg | ORAL_TABLET | Freq: Once | ORAL | Status: DC

## 2019-03-28 MED ORDER — WARFARIN - PHARMACIST DOSING INPATIENT: Freq: Every day | Status: DC

## 2019-03-28 MED ORDER — ACETAMINOPHEN 325 MG PO TABS: 650.0000 mg | ORAL_TABLET | Freq: Four times a day (QID) | ORAL | Status: DC | PRN

## 2019-03-28 MED ORDER — ASPIRIN EC 81 MG PO TBEC
81.0000 mg | DELAYED_RELEASE_TABLET | Freq: Every day | ORAL | Status: DC
  Administered 2019-03-28 – 2019-04-04 (×8): 81 mg via ORAL
  Filled 2019-03-28 (×8): qty 1

## 2019-03-28 MED ORDER — METOCLOPRAMIDE HCL 5 MG/ML IJ SOLN: 10.0000 mg | Freq: Once | INTRAMUSCULAR | Status: DC

## 2019-03-28 MED ORDER — ACETAMINOPHEN 650 MG RE SUPP: 650.0000 mg | Freq: Four times a day (QID) | RECTAL | Status: DC | PRN

## 2019-03-28 NOTE — ED Notes (Signed)
Called floor told to call back at 740 am for report per floor request

## 2019-03-28 NOTE — Progress Notes (Signed)
ANTICOAGULATION CONSULT NOTE - Initial Consult  Pharmacy Consult for warfarin Indication: Mechanical Valve  Allergies  Allergen Reactions  . Oxycodone Itching    Patient Measurements:   Heparin Dosing Weight:   Vital Signs: Temp: 98.3 F (36.8 C) (07/01 2300) Temp Source: Oral (07/01 2300) BP: 125/86 (07/02 0330) Pulse Rate: 106 (07/02 0330)  Labs: Recent Labs    03/28/19 0040  HGB 12.2  HCT 35.6*  PLT 415*  LABPROT 34.6*  INR 3.5*  CREATININE 0.73    CrCl cannot be calculated (Unknown ideal weight.).   Medical History: Past Medical History:  Diagnosis Date  . Acute diastolic heart failure ([place]) 03/15/2013  . Anemia   . Carotid stenosis    Carotid US 2/17: bilateral ICA 1-39% >> FU prn  . Childhood asthma   . Complete heart block ([place]) 05/28/2013   Post-op  . Epigastric hernia 200's  . GERD (gastroesophageal reflux disease)   . Heart murmur   . History of blood transfusion    "14 w/1st pregnancy; 2 w/last C-section" (03/15/2013)  . Hx of echocardiogram    a. Echo 4/16:  Mild focal basal septal hypertrophy, EF 60-65%, no RWMA, Mechanical MVR ok with mild central regurgitation and no perivalvular leak, small mobile density attached to valvular ring (1.5x1 cm) - post surgical changes vs SBE, mild LAE;   b. Echo 2/17:  Mild post wall LVH, EF 55-60%, no RWMA, mechanical MVR ok (mean 5 mmHg), normal RVSF  . Hypertension    no pcp   will go to [facility]  . 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 S/P mitral valve replacement with metallic valve 7/94/8016   65mm Sorin Carbomedics mechanical prosthesis via right mini thoracotomy approach  . 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   Complex valvuloplasty including Cor-matrix ECM patch augmentation of anterior and lateral leaflets with 47mm Edwards mc3 ring annuloplasty via right mini-thoracotomy approach  . Severe mitral regurgitation 04/22/2013  . Stroke ([place])   . SVT (supraventricular tachycardia) ([place])  03/16/2013  . Tricuspid regurgitation     Medications:  (Not in a hospital admission)  Scheduled:  . metoCLOPramide (REGLAN) injection  10 mg Intravenous Once  . warfarin  5 mg Oral ONCE-1800  . Warfarin - Pharmacist Dosing Inpatient   Does not apply q1800    Assessment: Patient on chronic warfarin for Mechanical Valve.  INR on admit 3.5 (goal 2.5-3.5)  Last dose warfarin noted 6/30 at 1800.  Goal of Therapy:  INR 2.5-3.5    Plan:  Warfarin 5mg  po x1 at 1800  Daily INR  Tyler Deis, Shea Stakes [place] 03/28/2019,4:47 AM

## 2019-03-28 NOTE — Progress Notes (Signed)
ANTICOAGULATION CONSULT NOTE - Follow Up Consult  Pharmacy Consult for heparin  Indication: mechanical MVR - bridge therapy while warfarin on hold  Allergies  Allergen Reactions  . Oxycodone Itching    Patient Measurements:   Heparin Dosing Weight:   Vital Signs: BP: 145/91 (07/02 0750) Pulse Rate: 104 (07/02 0750)  Labs: Recent Labs    03/28/19 0040 03/28/19 1003  HGB 12.2 12.1  HCT 35.6* 35.0*  PLT 415* 373  LABPROT 34.6*  --   INR 3.5*  --   CREATININE 0.73 0.87    CrCl cannot be calculated (Unknown ideal weight.).   Medications:  Medications Prior to Admission  Medication Sig Dispense Refill Last Dose  . aspirin EC 81 MG tablet Take 1 tablet (81 mg total) by mouth daily.   03/27/2019 at 930a  . fluticasone (FLONASE) 50 MCG/ACT nasal spray Place 1 spray into both nostrils daily as needed for allergies or rhinitis.   03/26/2019 at Unknown time  . Hyprom-Naphaz-Polysorb-Zn Sulf (CLEAR EYES COMPLETE OP) Place 2 drops into both eyes daily as needed (red eyes).   Past Month at Unknown time  . saccharomyces boulardii (FLORASTOR) 250 MG capsule Take 1 capsule (250 mg total) by mouth 2 (two) times daily. 60 capsule 0 Past Month at Unknown time  . warfarin (COUMADIN) 5 MG tablet Take as directed by coumadin clinic (Patient taking differently: Take 5-7.5 mg by mouth daily. M,W,and f 7.5 mg tablet. Tue, Thursday and Saturday and Sunday 5 mg) 50 tablet 0 03/26/2019 at 6pm    Assessment: 58 yo F on warfarin PTA for on PTA for mechanical mitral valve.  Pharmacy consulted to dose heparin for bridge therapy while warfarin on hold for pacemaker placement planned next Monday.  Home warfarin per coumadin clinic dosing: 7.5 mg MWF and 5 mg TTSS. Last dose 6/30 at 1800.  INR goal 2.5 - 3.6 Admission INR therapeutic at 3.5.  HG 12.1, PLTC 373.  No bleeding reported.   Wt on 02/12/19 56.8 kg  Goal of Therapy:  Heparin level 0.3-0.7 units/ml Monitor platelets by anticoagulation protocol:  Yes   Plan:  Start heparin per pharmacy once INR < 2.5 per Dr Meda Coffee Daily INR Daily CBC F/u weight - daily weights ordered by MD  Eudelia Bunch, Pharm.D 03/28/2019 3:04 PM

## 2019-03-28 NOTE — ED Notes (Signed)
When the writer of this note went into start an IV for medication and blood draw pt refused an IV and medication stating, "I only want my blood drawn, no medications or IV." Explained to pt that medication was for N/V, since pt was vomiting earlier. Pt still refused.   When obtaining orthostatic vital signs, pt stated she could not lay flat. Therefore, sitting and standing orthostatic VS were obtained.   EDP notified.

## 2019-03-28 NOTE — ED Provider Notes (Signed)
Patient with history of SVT, transient third-degree heart block, presents with headache after having hit her head today.  She states that she experienced palpitations and passed out.  See note from [place], [place] for full H&P.  I discussed case with Dr. Elson Areas, from cardiology, who recommends observation on telemetry in the hospital.  Patient is agreeable with this plan.  Will admit to medicine.  Cardiology consult in the morning.  Appreciate Dr. Hal Hope for admitting.   Montine Circle, PA-C 03/28/19 2904    Ripley Fraise, MD 03/28/19 [phone]

## 2019-03-28 NOTE — ED Notes (Addendum)
Attempted to call report to [phone]. No answer. Will try back.

## 2019-03-28 NOTE — Progress Notes (Signed)
PROGRESS NOTE  Mary [name] EXB:[id number] DOB: 01-29-1961 DOA: 03/27/2019 PCP: Cain Saupe, MD   LOS: 0 days   Patient is from: Home  Brief Narrative / Interim history: 58 year old female with history of mechanical mitral valve replacement, SVT, transient complete heart block, diastolic dysfunction and tricuspid valve repair admitted with syncopal episode.  In ED, tachycardic to low 100s.  Blood pressure within normal range.  CMP not impressive except for mild hyponatremia to 132 and ALP to 132.  CBC with macrocytosis.  UA with large Hgb.  INR 3.5.  COVID-19 negative.  CXR, CT head and cervical without acute finding.  EKG with sinus tachycardia, borderline QTC.  Recent echo basically normal.  Admitted for evaluation for syncope.  Cardiology consulted.  Subjective: No major events since admission.  She reports dyspnea, orthopnea and PND.  She denies edema.  No history of CHF.  Recent echo not impressive.  Not on diuretics.  She denies new medications.  Denies chest pain.  Objective: Vitals:   03/28/19 0530 03/28/19 0600 03/28/19 0630 03/28/19 0750  BP: 138/84 126/85 126/85 (!) 145/91  Pulse: (!) 105 (!) 105 (!) 104 (!) 104  Resp: 17 18 16 19   Temp:      TempSrc:      SpO2: 100% 100% 100% 100%   No intake or output data in the 24 hours ending 03/28/19 1515 There were no vitals filed for this visit.  Examination:  GENERAL: No acute distress.  Appears well.  HEENT: MMM.  Vision and hearing grossly intact.  NECK: Supple.  No apparent JVD. LUNGS:  No IWOB. Good air movement bilaterally. HEART: Regular rhythm.  Tachycardic to 100s.  Mechanical heart valve sounds. ABD: Bowel sounds present. Soft. Non tender.  MSK/EXT:  Moves all extremities. No apparent deformity. No edema bilaterally.  SKIN: no apparent skin lesion or wound NEURO: Awake, alert and oriented appropriately.  No gross deficit.  PSYCH: Calm. Normal affect.   I have personally reviewed the following labs and  images:  Radiology Studies: Ct Head Wo Contrast  Result Date: 03/28/2019 CLINICAL DATA:  Headache.  Fall. EXAM: CT HEAD WITHOUT CONTRAST CT CERVICAL SPINE WITHOUT CONTRAST TECHNIQUE: Multidetector CT imaging of the head and cervical spine was performed following the standard protocol without intravenous contrast. Multiplanar CT image reconstructions of the cervical spine were also generated. COMPARISON:  12/19/2016 FINDINGS: CT HEAD FINDINGS Brain: No acute intracranial abnormality. Specifically, no hemorrhage, hydrocephalus, mass lesion, acute infarction, or significant intracranial injury. Vascular: No hyperdense vessel or unexpected calcification. Skull: No acute calvarial abnormality. Sinuses/Orbits: Visualized paranasal sinuses and mastoids clear. Orbital soft tissues unremarkable. Other: None CT CERVICAL SPINE FINDINGS Alignment: Normal Skull base and vertebrae: No acute fracture. No primary bone lesion or focal pathologic process. Soft tissues and spinal canal: No prevertebral fluid or swelling. No visible canal hematoma. Disc levels:  Normal Upper chest: Normal Other: None IMPRESSION: No acute intracranial abnormality. No acute bony abnormality in the cervical spine. Electronically Signed   By: Charlett Nose M.D.   On: 03/28/2019 00:19   Ct Cervical Spine Wo Contrast  Result Date: 03/28/2019 CLINICAL DATA:  Headache.  Fall. EXAM: CT HEAD WITHOUT CONTRAST CT CERVICAL SPINE WITHOUT CONTRAST TECHNIQUE: Multidetector CT imaging of the head and cervical spine was performed following the standard protocol without intravenous contrast. Multiplanar CT image reconstructions of the cervical spine were also generated. COMPARISON:  12/19/2016 FINDINGS: CT HEAD FINDINGS Brain: No acute intracranial abnormality. Specifically, no hemorrhage, hydrocephalus, mass lesion, acute infarction, or  significant intracranial injury. Vascular: No hyperdense vessel or unexpected calcification. Skull: No acute calvarial  abnormality. Sinuses/Orbits: Visualized paranasal sinuses and mastoids clear. Orbital soft tissues unremarkable. Other: None CT CERVICAL SPINE FINDINGS Alignment: Normal Skull base and vertebrae: No acute fracture. No primary bone lesion or focal pathologic process. Soft tissues and spinal canal: No prevertebral fluid or swelling. No visible canal hematoma. Disc levels:  Normal Upper chest: Normal Other: None IMPRESSION: No acute intracranial abnormality. No acute bony abnormality in the cervical spine. Electronically Signed   By: Charlett Nose M.D.   On: 03/28/2019 00:19   Dg Chest Port 1 View  Result Date: 03/28/2019 CLINICAL DATA:  Cough EXAM: PORTABLE CHEST 1 VIEW COMPARISON:  05/24/2018 FINDINGS: No focal opacity or pleural effusion. Stable cardiomediastinal silhouette. Valve prostheses. No pneumothorax. IMPRESSION: No active disease. Electronically Signed   By: Jasmine Pang M.D.   On: 03/28/2019 00:40    Microbiology: Recent Results (from the past 240 hour(s))  SARS Coronavirus 2 (CEPHEID - Performed in [facility] hospital lab), Hosp Order     Status: None   Collection Time: 03/28/19  4:41 AM   Specimen: Nasopharyngeal Swab  Result Value Ref Range Status   SARS Coronavirus 2 NEGATIVE NEGATIVE Final    Comment: (NOTE) If result is NEGATIVE SARS-CoV-2 target nucleic acids are NOT DETECTED. The SARS-CoV-2 RNA is generally detectable in upper and lower  respiratory specimens during the acute phase of infection. The lowest  concentration of SARS-CoV-2 viral copies this assay can detect is 250  copies / mL. A negative result does not preclude SARS-CoV-2 infection  and should not be used as the sole basis for treatment or other  patient management decisions.  A negative result may occur with  improper specimen collection / handling, submission of specimen other  than nasopharyngeal swab, presence of viral mutation(s) within the  areas targeted by this assay, and inadequate number of viral  copies  (<250 copies / mL). A negative result must be combined with clinical  observations, patient history, and epidemiological information. If result is POSITIVE SARS-CoV-2 target nucleic acids are DETECTED. The SARS-CoV-2 RNA is generally detectable in upper and lower  respiratory specimens dur ing the acute phase of infection.  Positive  results are indicative of active infection with SARS-CoV-2.  Clinical  correlation with patient history and other diagnostic information is  necessary to determine patient infection status.  Positive results do  not rule out bacterial infection or co-infection with other viruses. If result is PRESUMPTIVE POSTIVE SARS-CoV-2 nucleic acids MAY BE PRESENT.   A presumptive positive result was obtained on the submitted specimen  and confirmed on repeat testing.  While 2019 novel coronavirus  (SARS-CoV-2) nucleic acids may be present in the submitted sample  additional confirmatory testing may be necessary for epidemiological  and / or clinical management purposes  to differentiate between  SARS-CoV-2 and other Sarbecovirus currently known to infect humans.  If clinically indicated additional testing with an alternate test  methodology [phone]) is advised. The SARS-CoV-2 RNA is generally  detectable in upper and lower respiratory sp ecimens during the acute  phase of infection. The expected result is Negative. Fact Sheet for Patients:  BoilerBrush.com.cy Fact Sheet for Healthcare Providers: https://pope.com/ This test is not yet approved or cleared by the [place] FDA and has been authorized for detection and/or diagnosis of SARS-CoV-2 by FDA under an Emergency Use Authorization (EUA).  This EUA will remain in effect (meaning this test can be used) for  the duration of the COVID-19 declaration under Section 564(b)(1) of the Act, 21 U.S.C. section 360bbb-3(b)(1), unless the authorization is terminated  or revoked sooner. Performed at [facility], [address], [place], [place] [address]     Sepsis Labs: Invalid input(s): PROCALCITONIN, LACTICIDVEN  Urine analysis:    Component Value Date/Time   COLORURINE YELLOW 03/28/2019 0040   APPEARANCEUR CLEAR 03/28/2019 0040   LABSPEC 1.003 (L) 03/28/2019 0040   PHURINE 6.0 03/28/2019 0040   GLUCOSEU NEGATIVE 03/28/2019 0040   HGBUR LARGE (A) 03/28/2019 0040   BILIRUBINUR NEGATIVE 03/28/2019 0040   KETONESUR NEGATIVE 03/28/2019 0040   PROTEINUR NEGATIVE 03/28/2019 0040   UROBILINOGEN 0.2 06/04/2015 1533   NITRITE NEGATIVE 03/28/2019 0040   LEUKOCYTESUR NEGATIVE 03/28/2019 0040    Anemia Panel: No results for input(s): VITAMINB12, FOLATE, FERRITIN, TIBC, IRON, RETICCTPCT in the last 72 hours.  Thyroid Function Tests: Recent Labs    03/28/19 1003  TSH 2.481  FREET4 0.69    Lipid Profile: No results for input(s): CHOL, HDL, LDLCALC, TRIG, CHOLHDL, LDLDIRECT in the last 72 hours.  CBG: No results for input(s): GLUCAP in the last 168 hours.  HbA1C: No results for input(s): HGBA1C in the last 72 hours.  BNP (last 3 results): No results for input(s): PROBNP in the last 8760 hours.  Cardiac Enzymes: No results for input(s): CKTOTAL, CKMB, CKMBINDEX, TROPONINI in the last 168 hours.  Coagulation Profile: Recent Labs  Lab 03/28/19 0040  INR 3.5*    Liver Function Tests: Recent Labs  Lab 03/28/19 0040 03/28/19 1003  AST 35 35  ALT 18 17  ALKPHOS 132* 139*  BILITOT 0.3 <0.1*  PROT 6.6 6.4*  ALBUMIN 3.0* 3.0*   No results for input(s): LIPASE, AMYLASE in the last 168 hours. No results for input(s): AMMONIA in the last 168 hours.  Basic Metabolic Panel: Recent Labs  Lab 03/28/19 0040 03/28/19 1003  NA 132* 134*  K 4.2 4.5  CL 100 100  CO2 22 23  GLUCOSE 93 103*  BUN <5* 6  CREATININE 0.73 0.87  CALCIUM 8.4* 8.6*   GFR: CrCl cannot be calculated (Unknown ideal  weight.).  CBC: Recent Labs  Lab 03/28/19 0040 03/28/19 1003  WBC 6.1 8.7  NEUTROABS 3.5 6.2  HGB 12.2 12.1  HCT 35.6* 35.0*  MCV 109.5* 111.5*  PLT 415* 373    Procedures:  None  Microbiology: COVID-19 negative  Assessment & Plan: Syncopal episode: Concern for arrhythmia given history of SVT and transient CHB -Appreciate cardiology input-holding Coumadin in anticipation for pacemaker placement on Monday -Heparin gtt for mechanical valve -Cardiology to consult EP 7/3  Mechanical MV/history of TV repair: On warfarin for anticoagulation.  INR therapeutic. -Anticoagulation plan as above  Dyspnea/orthopnea/PND: No history of CHF.  Does not look fluid overloaded.  Lung exam reassuring.  No JVD.  CXR not impressive.  Recent echocardiogram reassuring. -We will continue monitoring.  DVT prophylaxis: Heparin gtt Code Status: Full code Family Communication: Patient update family let me know if any question. Disposition Plan: Remains inpatient for further evaluation and management of syncope and consideration for pacemaker placement Consultants: Cardiology  Antimicrobials: Anti-infectives (From admission, onward)   None      Sch Meds:  Scheduled Meds:  aspirin EC  81 mg Oral Daily   Continuous Infusions: PRN Meds:.acetaminophen **OR** acetaminophen   Crosby Bevan T. Monai Hindes [facility]  If 7PM-7AM, please contact night-coverage [email or web address] Password [email or web address] 03/28/2019, 3:15 PM

## 2019-03-28 NOTE — Consult Note (Addendum)
Cardiology Consultation:   Patient ID: Mary [name] MRN: [id number]; DOB: 1960-11-17  Admit date: 03/27/2019 Date of Consult: 03/28/2019  Primary Care Provider: Antony Blackbird, MD Primary Cardiologist: Candee Furbish, MD  Primary Electrophysiologist:  Cristopher Peru, MD    Patient Profile:   Mary [name] is a 58 y.o. 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 Hal Hope.  History of Present Illness:   Mary [name] was recenlty seen in clinic and established care with Dr. Marlou Porch (previously Dr. Aundra Dubin). She was seen for evaluation of SOB and chest tightness mentioned while on telemedicine visit with Dr. Lovena Le. She has longstanding SOB since 2018. Echocardiogram was obtained which showed preserved EF of > 65% ad no RWMA with good mitral valve function and tricuspid valve repair with trivial TR.   She also complained of arm discomfort that was felt to be consistent with neuropathy. Heart cath in 2014 negative for obstructive CAD. Echo negative for RWMA, as above. No further ischemic evaluation was planned.  Avoiding beta blockers for transient complete heart block on holter monitor in 2019. She reports symptoms are worse when laying down.   Of note, Dr. Lovena Le saw her on 04/24/18 and recommended a PPM at that time. Patient never called back to proceed with PPM placement.   She presented to [facility] after a syncopal episode with loss of consciousness. She did strike her head and does not know how long she had LOC.  Cardiology was asked to evaluate.   On my interview, she states she has felt fatigued for the last year. She reports feeling lightheaded and dizzy prior to her LOC. This is the second episode of syncope (first was in 2019). She does not drive.  No chest pain, SOB, DOE, orthopnea, and lower extremity swelling.   Heart Pathway Score:     Past Medical History:  Diagnosis Date  . Acute diastolic heart failure ([place]) 03/15/2013  . Anemia   . Carotid stenosis    Carotid US 2/17: bilateral ICA 1-39% >> FU prn  . Childhood asthma   . Complete heart block ([place]) 05/28/2013   Post-op  . Epigastric hernia 200's  . GERD (gastroesophageal reflux disease)   . Heart murmur   . History of blood transfusion    "14 w/1st pregnancy; 2 w/last C-section" (03/15/2013)  . Hx of echocardiogram    a. Echo 4/16:  Mild focal basal septal hypertrophy, EF 60-65%, no RWMA, Mechanical MVR ok with mild central regurgitation and no perivalvular leak, small mobile density attached to valvular ring (1.5x1 cm) - post surgical changes vs SBE, mild LAE;   b. Echo 2/17:  Mild post wall LVH, EF 55-60%, no RWMA, mechanical MVR ok (mean 5 mmHg), normal RVSF  . Hypertension    no pcp   will go to [facility]  . 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 S/P mitral valve replacement with metallic valve 05/13/5630   84mm Sorin Carbomedics mechanical prosthesis via right mini thoracotomy approach  . 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   Complex valvuloplasty including Cor-matrix ECM patch augmentation of anterior and lateral leaflets with 46mm Edwards mc3 ring annuloplasty via right mini-thoracotomy approach  . Severe mitral regurgitation 04/22/2013  . Stroke ([place])   . SVT (supraventricular tachycardia) ([place]) 03/16/2013  .  Tricuspid regurgitation     Past Surgical History:  Procedure Laterality Date  . APPENDECTOMY  [id number]  . CARDIAC CATHETERIZATION    . CARDIAC VALVE REPLACEMENT    . CESAREAN SECTION  1983  . CESAREAN SECTION WITH BILATERAL TUBAL LIGATION  1999  . FEMORAL HERNIA REPAIR Right 05/23/2013   Procedure: HERNIA REPAIR FEMORAL;  Surgeon: Rexene Alberts, MD;  Location: [facility];  Service: Open Heart Surgery;  Laterality: Right;  . INTRAOPERATIVE  TRANSESOPHAGEAL ECHOCARDIOGRAM N/A 05/23/2013   Procedure: INTRAOPERATIVE TRANSESOPHAGEAL ECHOCARDIOGRAM;  Surgeon: Rexene Alberts, MD;  Location: [place];  Service: Open Heart Surgery;  Laterality: N/A;  . LAPAROSCOPIC CHOLECYSTECTOMY  2003  . LEFT AND RIGHT HEART CATHETERIZATION WITH CORONARY ANGIOGRAM N/A 03/22/2013   Procedure: LEFT AND RIGHT HEART CATHETERIZATION WITH CORONARY ANGIOGRAM;  Surgeon: Burnell Blanks, MD;  Location: [facility] CATH LAB;  Service: Cardiovascular;  Laterality: N/A;  . MINIMALLY INVASIVE TRICUSPID VALVE REPAIR Right 05/23/2013   Procedure: MINIMALLY INVASIVE TRICUSPID VALVE REPAIR;  Surgeon: Rexene Alberts, MD;  Location: [facility];  Service: Open Heart Surgery;  Laterality: Right;  . MITRAL VALVE REPLACEMENT N/A 05/23/2013   Procedure: MITRAL VALVE (MV) REPLACEMENT;  Surgeon: Rexene Alberts, MD;  Location: [place];  Service: Open Heart Surgery;  Laterality: N/A;  . MULTIPLE EXTRACTIONS WITH ALVEOLOPLASTY N/A 04/04/2013   Procedure: Extraction of tooth #'s 1,8,9,13,14,15,23,24,25,26 with alveoloplasty and gross debridement of remaining teeth;  Surgeon: Lenn Cal, DDS;  Location: [facility];  Service: Oral Surgery;  Laterality: N/A;  . TEE WITHOUT CARDIOVERSION N/A 03/18/2013   Procedure: TRANSESOPHAGEAL ECHOCARDIOGRAM (TEE);  Surgeon: Larey Dresser, MD;  Location: [facility];  Service: Cardiovascular;  Laterality: N/A;  . TEE WITHOUT CARDIOVERSION N/A 06/17/2013   Procedure: TRANSESOPHAGEAL ECHOCARDIOGRAM (TEE);  Surgeon: Lelon Perla, MD;  Location: [facility] ENDOSCOPY;  Service: Cardiovascular;  Laterality: N/A;  . TUBAL LIGATION  1999     Home Medications:  Prior to Admission medications   Medication Sig Start Date End Date Taking? Authorizing Provider  aspirin EC 81 MG tablet Take 1 tablet (81 mg total) by mouth daily. 08/26/13  Yes Larey Dresser, MD  fluticasone [facility]) 50 MCG/ACT nasal spray Place 1 spray into both nostrils daily as needed for allergies or  rhinitis.   Yes [provider]  Hyprom-Naphaz-Polysorb-Zn Sulf (CLEAR EYES COMPLETE OP) Place 2 drops into both eyes daily as needed (red eyes).   Yes [provider]  saccharomyces boulardii (FLORASTOR) 250 MG capsule Take 1 capsule (250 mg total) by mouth 2 (two) times daily. 12/07/18  Yes Georgette Shell, MD  warfarin (COUMADIN) 5 MG tablet Take as directed by coumadin clinic Patient taking differently: Take 5-7.5 mg by mouth daily. M,W,and f 7.5 mg tablet. Tue, Thursday and Saturday and Sunday 5 mg 02/20/19  Yes Evans Lance, MD    Inpatient Medications: Scheduled Meds: . aspirin EC  81 mg Oral Daily  . warfarin  5 mg Oral ONCE-1800  . Warfarin - Pharmacist Dosing Inpatient   Does not apply q1800   Continuous Infusions:  PRN Meds: acetaminophen **OR** acetaminophen  Allergies:    Allergies  Allergen Reactions  . Oxycodone Itching    Social History:   Social History   Socioeconomic History  . Marital status: Single    Spouse name: Not on file  . Number of children: Not on file  . Years of education: Not on file  . Highest education level: Not on file  Occupational History  . Not on file  Social Needs  . Financial resource strain: Not on file  . Food insecurity    Worry: Not on file    Inability: Not on file  . Transportation needs    Medical: Not on file    Non-medical: Not on file  Tobacco Use  . Smoking status: Former Smoker    Packs/day: 0.50    Years: 36.00    Pack years: 18.00    Types: Cigarettes    Quit date: 03/15/2013    Years since quitting: 6.0  . Smokeless tobacco: Never Used  Substance and Sexual Activity  . Alcohol use: Yes    Comment: 01/11/2017  "glass of wine/month"  . Drug use: No  . Sexual activity: Never  Lifestyle  . Physical activity    Days per week: Not on file    Minutes per session: Not on file  . Stress: Not on file  Relationships  . Social Herbalist on phone: Not on file    Gets together:  Not on file    Attends religious service: Not on file    Active member of club or organization: Not on file    Attends meetings of clubs or organizations: Not on file    Relationship status: Not on file  . Intimate partner violence    Fear of current or ex partner: Not on file    Emotionally abused: Not on file    Physically abused: Not on file    Forced sexual activity: Not on file  Other Topics Concern  . Not on file  Social History Narrative  . Not on file    Family History:    Family History  Problem Relation Age of Onset  . Cancer Mother   . Stroke Father   . Sarcoidosis Sister   . Heart attack Neg Hx      ROS:  Please see the history of present illness.   All other ROS reviewed and negative.     Physical Exam/Data:   Vitals:   03/28/19 0530 03/28/19 0600 03/28/19 0630 03/28/19 0750  BP: 138/84 126/85 126/85 (!) 145/91  Pulse: (!) 105 (!) 105 (!) 104 (!) 104  Resp: 17 18 16 19   Temp:      TempSrc:      SpO2: 100% 100% 100% 100%   No intake or output data in the 24 hours ending 03/28/19 1420 Last 3 Weights 02/12/2019 12/05/2018 11/20/2018  Weight (lbs) 125 lb 3.2 oz 128 lb 1.4 oz 129 lb  Weight (kg) 56.79 kg 58.1 kg 58.514 kg     There is no height or weight on file to calculate BMI.  General:  Well nourished, well developed, in no acute distress HEENT: normal Neck: no JVD Vascular: No carotid bruits Cardiac:  normal S1, S2; RRR, mitral valve click Lungs:  clear to auscultation bilaterally, no wheezing, rhonchi or rales  Abd: soft, nontender, no hepatomegaly  Ext: no edema Musculoskeletal:  No deformities, BUE and BLE strength normal and equal Skin: warm and dry  Neuro:  CNs 2-12 intact, no focal abnormalities noted Psych:  Normal affect   EKG:  The EKG was personally reviewed and demonstrates:  Sinus tachycardia with HR 113, low voltage Telemetry:  Telemetry was personally reviewed and demonstrates:  Sinus tachycardia with HR 100-140s  Relevant CV  Studies:  Echo 03/06/19: 1. The left ventricle has hyperdynamic systolic function, with an ejection fraction of >65%. The cavity size  was normal. Left ventricular diastolic function could not be evaluated due to nondiagnostic images. No evidence of left ventricular regional  wall motion abnormalities.  2. The right ventricle has normal systolic function. The cavity was normal. There is no increase in right ventricular wall thickness.  3. S/P 73mm Sorin Carbomedics mechanical MVR of the mitral valve leaflet. Doppler interrogation of the MVR was not performed. Cannot assess MR due to valve shadowing from prosthesis.  4. S/P TV repair with annuloplasty ring wiht trivial TR  5. The aortic valve is tricuspid. Mild sclerosis of the aortic valve. Aortic valve regurgitation was not assessed by color flow Doppler.   Holter monitor 03/30/2018:  Normal sinus rhythm and sinus tachycardia with average heart rate 109 bpm. Heart rate ranged from 27 to 171 bpm.  Intermittent complete heart block with slow ventricular response at 47 bpm during the daytime.  SVT at 171 bpm  Occasional PVCs and wide-complex tachycardia up to 5 beats   Cardiac catheterization 02/2013 1. No angiographic evidence of CAD 2. Normal LV function.  3. Severe MR   Laboratory Data:  High Sensitivity Troponin:  No results for input(s): TROPONINIHS in the last 720 hours.   Cardiac EnzymesNo results for input(s): TROPONINI in the last 168 hours. No results for input(s): TROPIPOC in the last 168 hours.  Chemistry Recent Labs  Lab 03/28/19 0040 03/28/19 1003  NA 132* 134*  K 4.2 4.5  CL 100 100  CO2 22 23  GLUCOSE 93 103*  BUN <5* 6  CREATININE 0.73 0.87  CALCIUM 8.4* 8.6*  GFRNONAA >60 >60  GFRAA >60 >60  ANIONGAP 10 11    Recent Labs  Lab 03/28/19 0040 03/28/19 1003  PROT 6.6 6.4*  ALBUMIN 3.0* 3.0*  AST 35 35  ALT 18 17  ALKPHOS 132* 139*  BILITOT 0.3 <0.1*   Hematology Recent Labs  Lab 03/28/19 0040  03/28/19 1003  WBC 6.1 8.7  RBC 3.25* 3.14*  HGB 12.2 12.1  HCT 35.6* 35.0*  MCV 109.5* 111.5*  MCH 37.5* 38.5*  MCHC 34.3 34.6  RDW 15.2 15.6*  PLT 415* 373   BNPNo results for input(s): BNP, PROBNP in the last 168 hours.  DDimer No results for input(s): DDIMER in the last 168 hours.   Radiology/Studies:  Ct Head Wo Contrast  Result Date: 03/28/2019 CLINICAL DATA:  Headache.  Fall. EXAM: CT HEAD WITHOUT CONTRAST CT CERVICAL SPINE WITHOUT CONTRAST TECHNIQUE: Multidetector CT imaging of the head and cervical spine was performed following the standard protocol without intravenous contrast. Multiplanar CT image reconstructions of the cervical spine were also generated. COMPARISON:  12/19/2016 FINDINGS: CT HEAD FINDINGS Brain: No acute intracranial abnormality. Specifically, no hemorrhage, hydrocephalus, mass lesion, acute infarction, or significant intracranial injury. Vascular: No hyperdense vessel or unexpected calcification. Skull: No acute calvarial abnormality. Sinuses/Orbits: Visualized paranasal sinuses and mastoids clear. Orbital soft tissues unremarkable. Other: None CT CERVICAL SPINE FINDINGS Alignment: Normal Skull base and vertebrae: No acute fracture. No primary bone lesion or focal pathologic process. Soft tissues and spinal canal: No prevertebral fluid or swelling. No visible canal hematoma. Disc levels:  Normal Upper chest: Normal Other: None IMPRESSION: No acute intracranial abnormality. No acute bony abnormality in the cervical spine. Electronically Signed   By: Rolm Baptise M.D.   On: 03/28/2019 00:19   Ct Cervical Spine Wo Contrast  Result Date: 03/28/2019 CLINICAL DATA:  Headache.  Fall. EXAM: CT HEAD WITHOUT CONTRAST CT CERVICAL SPINE WITHOUT CONTRAST TECHNIQUE: Multidetector CT imaging of the  head and cervical spine was performed following the standard protocol without intravenous contrast. Multiplanar CT image reconstructions of the cervical spine were also generated.  COMPARISON:  12/19/2016 FINDINGS: CT HEAD FINDINGS Brain: No acute intracranial abnormality. Specifically, no hemorrhage, hydrocephalus, mass lesion, acute infarction, or significant intracranial injury. Vascular: No hyperdense vessel or unexpected calcification. Skull: No acute calvarial abnormality. Sinuses/Orbits: Visualized paranasal sinuses and mastoids clear. Orbital soft tissues unremarkable. Other: None CT CERVICAL SPINE FINDINGS Alignment: Normal Skull base and vertebrae: No acute fracture. No primary bone lesion or focal pathologic process. Soft tissues and spinal canal: No prevertebral fluid or swelling. No visible canal hematoma. Disc levels:  Normal Upper chest: Normal Other: None IMPRESSION: No acute intracranial abnormality. No acute bony abnormality in the cervical spine. Electronically Signed   By: Rolm Baptise M.D.   On: 03/28/2019 00:19   Dg Chest Port 1 View  Result Date: 03/28/2019 CLINICAL DATA:  Cough EXAM: PORTABLE CHEST 1 VIEW COMPARISON:  05/24/2018 FINDINGS: No focal opacity or pleural effusion. Stable cardiomediastinal silhouette. Valve prostheses. No pneumothorax. IMPRESSION: No active disease. Electronically Signed   By: Donavan Foil M.D.   On: 03/28/2019 00:40    Assessment and Plan:   1. Syncope 2. Transient CHB 3. Hx of SVT - by holter monitor in 2019 - TSH normal - Dr. Lovena Le evaluated and discussed need for PPM, but patient did not proceed at that time (2019) - will consult EP for further guidance - on coumadin, may need to bridge with heparin 4. Mechanical mitral valve 5. S/p tricuspid valve repair - AC with coumadin - stable on recent echo - INR 3.5  For questions or updates, please contact [place] Please consult [email or web address] for contact info under   Signed, Ena Dawley, MD  03/28/2019 2:20 PM  The patient was seen, examined and discussed with Minette Brine , PA-C and I agree with the above.   58 y.o. female with a hx of valvular heart  disease with a mechanical mitral valve and s/p tricuspid valve repair, chronic diastolic heart failure with transient complete heart block not on medical therapy, hypertension, stroke who is being seen today for the evaluation of syncope. She was seen for evaluation of SOB and chest tightness mentioned while on telemedicine visit with Dr. Lovena Le. She has longstanding SOB since 2018. Echocardiogram was obtained which showed preserved EF of > 65% ad no RWMA with good mitral valve function and tricuspid valve repair with trivial TR.  Heart cath in 2014 negative for obstructive CAD. Echo negative for RWMA, as above. No further ischemic evaluation was planned. Avoiding beta blockers for transient complete heart block on holter monitor in 2019. Dr. Lovena Le saw her on 04/24/18 and recommended a PPM at that time.  The patient did not want to have pacemaker implanted and did not follow-up.    She lives alone, and states she has been doing okay until yesterday when she developed palpitations, sudden onset, associated with dizziness eventually she passed out.  Her mother was on on the phone with her called for help. She is currently in sinus tachycardia on telemetry with ventricular rates in low 100s, EKG shows sinus tachycardia otherwise normal EKG. Labs are unremarkable normal kidney liver function, normal CBC INR 3.5 normal thyroid function.  Physical exam reveals pleasant lady in no acute distress, no JVDs, X6-I6 with S1 click, no significant murmurs, lungs are clear to auscultations, extremities have no edema and good peripheral pulses.  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  She is not fluid overloaded but rather dry, I would encourage p.o. fluid intake.  Ena Dawley, MD 03/28/2019

## 2019-03-28 NOTE — ED Notes (Signed)
Report given to 4W

## 2019-03-28 NOTE — H&P (Signed)
History and Physical    IZZABELLA BESSE RKY:[id number] DOB: Jan 31, 1961 DOA: 03/27/2019  PCP: Antony Blackbird, MD  Patient coming from: Home.  Chief Complaint: Loss of consciousness.  HPI: Poppi BRITTIE WHISNANT is a 58 y.o. 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  Patient states that she gets palpitation off and on last evening she had prior to her syncopal episode.  She does not know how long she lost consciousness but after falling she hit her head and had some headache.  Denies chest pain shortness of breath has been having some cough which is chronic.  On the way to the ER patient did throw up once after prolonged cough.  ED Course: In the ER patient had CT head and C-spine which were unremarkable.  Chest x-ray did not show anything acute.  EKG shows sinus tachycardia.  Patient's INR was therapeutic.  On-call cardiologist was consulted and advised to get patient admitted for further observation.  Review of Systems: As per HPI, rest all negative.   Past Medical History:  Diagnosis Date   Acute diastolic heart failure ([place]) 03/15/2013   Anemia    Carotid stenosis    Carotid US 2/17: bilateral ICA 1-39% >> FU prn   Childhood asthma    Complete heart block ([place]) 05/28/2013   Post-op   Epigastric hernia 200's   GERD (gastroesophageal reflux disease)    Heart murmur    History of blood transfusion    "14 w/1st pregnancy; 2 w/last C-section" (03/15/2013)   Hx of echocardiogram    a. Echo 4/16:  Mild focal basal septal hypertrophy, EF 60-65%, no RWMA, Mechanical MVR ok with mild central regurgitation and no perivalvular leak, small mobile density attached to valvular ring (1.5x1 cm) - post surgical changes vs SBE, mild LAE;   b. 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   Complex valvuloplasty including Cor-matrix ECM patch augmentation of anterior and lateral leaflets with 24mm Edwards mc3 ring annuloplasty via right mini-thoracotomy approach   Severe mitral regurgitation 04/22/2013   Stroke [facility])    SVT (supraventricular tachycardia) ([place]) 03/16/2013   Tricuspid regurgitation     Past Surgical History:  Procedure Laterality Date   APPENDECTOMY  ~1978   CARDIAC CATHETERIZATION     CARDIAC VALVE REPLACEMENT     CESAREAN SECTION  1983   CESAREAN SECTION WITH BILATERAL TUBAL LIGATION  1999   FEMORAL HERNIA REPAIR Right 05/23/2013   Procedure: HERNIA REPAIR FEMORAL;  Surgeon: Rexene Alberts, MD;  Location: [place];  Service: Open Heart Surgery;  Laterality: Right;   INTRAOPERATIVE TRANSESOPHAGEAL ECHOCARDIOGRAM N/A 05/23/2013   Procedure: INTRAOPERATIVE TRANSESOPHAGEAL ECHOCARDIOGRAM;  Surgeon: Rexene Alberts, MD;  Location: [place];  Service: Open Heart Surgery;  Laterality: N/A;   LAPAROSCOPIC CHOLECYSTECTOMY  2003   LEFT AND RIGHT HEART CATHETERIZATION WITH CORONARY ANGIOGRAM N/A 03/22/2013   Procedure: LEFT AND RIGHT HEART CATHETERIZATION WITH CORONARY ANGIOGRAM;  Surgeon: Burnell Blanks, MD;  Location: [facility] CATH LAB;  Service: Cardiovascular;  Laterality: N/A;   MINIMALLY INVASIVE TRICUSPID VALVE REPAIR Right 05/23/2013   Procedure: MINIMALLY INVASIVE TRICUSPID VALVE REPAIR;  Surgeon: Rexene Alberts, MD;  Location: [place];  Service: Open Heart Surgery;  Laterality: Right;   MITRAL VALVE REPLACEMENT N/A 05/23/2013   Procedure: MITRAL VALVE (MV) REPLACEMENT;  Surgeon: Rexene Alberts, MD;  Location: [place];  Service: Open Heart Surgery;   Laterality: N/A;   MULTIPLE EXTRACTIONS WITH ALVEOLOPLASTY N/A 04/04/2013   Procedure: Extraction of tooth #'s 1,8,9,13,14,15,23,24,25,26 with alveoloplasty and gross debridement of remaining teeth;  Surgeon: Lenn Cal, DDS;  Location: [place];  Service: Oral Surgery;  Laterality: N/A;   TEE WITHOUT CARDIOVERSION N/A 03/18/2013   Procedure: TRANSESOPHAGEAL ECHOCARDIOGRAM (TEE);  Surgeon: Larey Dresser, MD;  Location: [place];  Service: Cardiovascular;  Laterality: N/A;   TEE WITHOUT CARDIOVERSION N/A 06/17/2013   Procedure: TRANSESOPHAGEAL ECHOCARDIOGRAM (TEE);  Surgeon: Lelon Perla, MD;  Location: [facility] ENDOSCOPY;  Service: Cardiovascular;  Laterality: N/A;   [place]     reports that she quit smoking about 6 years ago. Her smoking use included cigarettes. She has a 18.00 pack-year smoking history. She has never used smokeless tobacco. She reports current alcohol use. She reports that she does not use drugs.  Allergies  Allergen Reactions   Oxycodone Itching    Family History  Problem Relation Age of Onset   Cancer Mother    Stroke Father    Sarcoidosis Sister    Heart attack Neg Hx     Prior to Admission medications   Medication Sig Start Date End Date Taking? Authorizing Provider  aspirin EC 81 MG tablet Take 1 tablet (81 mg total) by mouth daily. 08/26/13  Yes Larey Dresser, MD  fluticasone [facility]) 50 MCG/ACT nasal spray Place 1 spray into both nostrils daily as needed for allergies or rhinitis.   Yes [provider]  Hyprom-Naphaz-Polysorb-Zn Sulf (CLEAR EYES COMPLETE OP) Place 2 drops into both eyes daily as needed (red eyes).   Yes [provider]  saccharomyces boulardii (FLORASTOR) 250 MG capsule Take 1 capsule (250 mg total) by mouth 2 (two) times daily. 12/07/18  Yes Georgette Shell, MD  warfarin (COUMADIN) 5 MG tablet Take as directed by coumadin clinic Patient taking differently: Take 5-7.5 mg by mouth daily.  M,W,and f 7.5 mg tablet. Tue, Thursday and Saturday and Sunday 5 mg 02/20/19  Yes Evans Lance, MD    Physical Exam:    Constitutional: Moderately built and nourished. Vitals:   03/27/19 2300 03/28/19 0040 03/28/19 0205 03/28/19 0330  BP: 109/80 (!) 120/93 113/80 125/86  Pulse: (!) 108 (!) 109 (!) 109 (!) 106  Resp: 19 18 15 17   Temp: 98.3 F (36.8 C)     TempSrc: Oral     SpO2: 99% 100% 100% 99%   Eyes: Anicteric no pallor. ENMT: No discharge from the ears eyes nose or mouth. Neck: No mass felt.  No neck rigidity. Respiratory: No rhonchi or crepitations. Cardiovascular: S1-S2 heard. Abdomen: Soft nontender bowel sounds present. Musculoskeletal: No edema.  Joint effusion. Skin: No rash. Neurologic: Alert awake oriented to time place and person.  Moves all extremities. Psychiatric: Appears normal per normal affect.   Labs on Admission: I have personally reviewed following labs and imaging studies  CBC: Recent Labs  Lab 03/28/19 0040  WBC 6.1  NEUTROABS 3.5  HGB 12.2  HCT 35.6*  MCV 109.5*  PLT 400*   Basic Metabolic  Panel: Recent Labs  Lab 03/28/19 0040  NA 132*  K 4.2  CL 100  CO2 22  GLUCOSE 93  BUN <5*  CREATININE 0.73  CALCIUM 8.4*   GFR: CrCl cannot be calculated (Unknown ideal weight.). Liver Function Tests: Recent Labs  Lab 03/28/19 0040  AST 35  ALT 18  ALKPHOS 132*  BILITOT 0.3  PROT 6.6  ALBUMIN 3.0*   No results for input(s): LIPASE, AMYLASE in the last 168 hours. No results for input(s): AMMONIA in the last 168 hours. Coagulation Profile: Recent Labs  Lab 03/28/19 0040  INR 3.5*   Cardiac Enzymes: No results for input(s): CKTOTAL, CKMB, CKMBINDEX, TROPONINI in the last 168 hours. BNP (last 3 results) No results for input(s): PROBNP in the last 8760 hours. HbA1C: No results for input(s): HGBA1C in the last 72 hours. CBG: No results for input(s): GLUCAP in the last 168 hours. Lipid Profile: No results for input(s): CHOL,  HDL, LDLCALC, TRIG, CHOLHDL, LDLDIRECT in the last 72 hours. Thyroid Function Tests: No results for input(s): TSH, T4TOTAL, FREET4, T3FREE, THYROIDAB in the last 72 hours. Anemia Panel: No results for input(s): VITAMINB12, FOLATE, FERRITIN, TIBC, IRON, RETICCTPCT in the last 72 hours. Urine analysis:    Component Value Date/Time   COLORURINE YELLOW 03/28/2019 0040   APPEARANCEUR CLEAR 03/28/2019 0040   LABSPEC 1.003 (L) 03/28/2019 0040   PHURINE 6.0 03/28/2019 0040   GLUCOSEU NEGATIVE 03/28/2019 0040   HGBUR LARGE (A) 03/28/2019 0040   BILIRUBINUR NEGATIVE 03/28/2019 0040   KETONESUR NEGATIVE 03/28/2019 0040   PROTEINUR NEGATIVE 03/28/2019 0040   UROBILINOGEN 0.2 06/04/2015 1533   NITRITE NEGATIVE 03/28/2019 0040   LEUKOCYTESUR NEGATIVE 03/28/2019 0040   Sepsis Labs: @LABRCNTIP (procalcitonin:4,lacticidven:4) )No results found for this or any previous visit (from the past 240 hour(s)).   Radiological Exams on Admission: Ct Head Wo Contrast  Result Date: 03/28/2019 CLINICAL DATA:  Headache.  Fall. EXAM: CT HEAD WITHOUT CONTRAST CT CERVICAL SPINE WITHOUT CONTRAST TECHNIQUE: Multidetector CT imaging of the head and cervical spine was performed following the standard protocol without intravenous contrast. Multiplanar CT image reconstructions of the cervical spine were also generated. COMPARISON:  12/19/2016 FINDINGS: CT HEAD FINDINGS Brain: No acute intracranial abnormality. Specifically, no hemorrhage, hydrocephalus, mass lesion, acute infarction, or significant intracranial injury. Vascular: No hyperdense vessel or unexpected calcification. Skull: No acute calvarial abnormality. Sinuses/Orbits: Visualized paranasal sinuses and mastoids clear. Orbital soft tissues unremarkable. Other: None CT CERVICAL SPINE FINDINGS Alignment: Normal Skull base and vertebrae: No acute fracture. No primary bone lesion or focal pathologic process. Soft tissues and spinal canal: No prevertebral fluid or swelling.  No visible canal hematoma. Disc levels:  Normal Upper chest: Normal Other: None IMPRESSION: No acute intracranial abnormality. No acute bony abnormality in the cervical spine. Electronically Signed   By: Rolm Baptise M.D.   On: 03/28/2019 00:19   Ct Cervical Spine Wo Contrast  Result Date: 03/28/2019 CLINICAL DATA:  Headache.  Fall. EXAM: CT HEAD WITHOUT CONTRAST CT CERVICAL SPINE WITHOUT CONTRAST TECHNIQUE: Multidetector CT imaging of the head and cervical spine was performed following the standard protocol without intravenous contrast. Multiplanar CT image reconstructions of the cervical spine were also generated. COMPARISON:  12/19/2016 FINDINGS: CT HEAD FINDINGS Brain: No acute intracranial abnormality. Specifically, no hemorrhage, hydrocephalus, mass lesion, acute infarction, or significant intracranial injury. Vascular: No hyperdense vessel or unexpected calcification. Skull: No acute calvarial abnormality. Sinuses/Orbits: Visualized paranasal sinuses and mastoids clear. Orbital soft tissues unremarkable. Other: None CT CERVICAL SPINE FINDINGS Alignment:  Normal Skull base and vertebrae: No acute fracture. No primary bone lesion or focal pathologic process. Soft tissues and spinal canal: No prevertebral fluid or swelling. No visible canal hematoma. Disc levels:  Normal Upper chest: Normal Other: None IMPRESSION: No acute intracranial abnormality. No acute bony abnormality in the cervical spine. Electronically Signed   By: Rolm Baptise M.D.   On: 03/28/2019 00:19   Dg Chest Port 1 View  Result Date: 03/28/2019 CLINICAL DATA:  Cough EXAM: PORTABLE CHEST 1 VIEW COMPARISON:  05/24/2018 FINDINGS: No focal opacity or pleural effusion. Stable cardiomediastinal silhouette. Valve prostheses. No pneumothorax. IMPRESSION: No active disease. Electronically Signed   By: Donavan Foil M.D.   On: 03/28/2019 00:40    EKG: Independently reviewed.  Sinus tachycardia.  Assessment/Plan Principal Problem:    Syncope Active Problems:   H/O heart valve replacement with mechanical valve   S/P minimally-invasive tricuspid valve repair    1. Syncope -we will monitor in telemetry.  Given the history of SVT and transit complete heart block and mitral valve replacement with mechanical valve I have consulted cardiology for further recommendations.  Coumadin per pharmacy.  Last 2D echo was done last month. 2. Nausea vomiting respiratory.  Cough.  Unremarkable LFTs unremarkable abdomen appears benign closely observe.   DVT prophylaxis: Coumadin. Code Status: Full code. Family Communication: Discussed with patient. Disposition Plan: Home. Consults called: Cardiology. Admission status: Observation.   Rise Patience MD [facility] Pager [phone].  If 7PM-7AM, please contact night-coverage [email or web address] Password [email or web address]  03/28/2019, 4:40 AM

## 2019-03-29 DIAGNOSIS — Z8679 Personal history of other diseases of the circulatory system: Secondary | ICD-10-CM

## 2019-03-29 DIAGNOSIS — R55 Syncope and collapse: Principal | ICD-10-CM

## 2019-03-29 DIAGNOSIS — Z952 Presence of prosthetic heart valve: Secondary | ICD-10-CM

## 2019-03-29 DIAGNOSIS — R000 Tachycardia, unspecified: Secondary | ICD-10-CM

## 2019-03-29 DIAGNOSIS — Z9889 Other specified postprocedural states: Secondary | ICD-10-CM

## 2019-03-29 LAB — IRON AND TIBC
Iron: 176 ug/dL — ABNORMAL HIGH (ref 28–170)
Saturation Ratios: 60 % — ABNORMAL HIGH (ref 10.4–31.8)
TIBC: 292 ug/dL (ref 250–450)
UIBC: 116 ug/dL

## 2019-03-29 LAB — CBC
HCT: 31 % — ABNORMAL LOW (ref 36.0–46.0)
Hemoglobin: 10.4 g/dL — ABNORMAL LOW (ref 12.0–15.0)
MCH: 37 pg — ABNORMAL HIGH (ref 26.0–34.0)
MCHC: 33.5 g/dL (ref 30.0–36.0)
MCV: 110.3 fL — ABNORMAL HIGH (ref 80.0–100.0)
Platelets: 351 10*3/uL (ref 150–400)
RBC: 2.81 MIL/uL — ABNORMAL LOW (ref 3.87–5.11)
RDW: 15 % (ref 11.5–15.5)
WBC: 7.4 10*3/uL (ref 4.0–10.5)
nRBC: 0 % (ref 0.0–0.2)

## 2019-03-29 LAB — VITAMIN B12: Vitamin B-12: 211 pg/mL (ref 180–914)

## 2019-03-29 LAB — BASIC METABOLIC PANEL
Anion gap: 7 (ref 5–15)
BUN: 9 mg/dL (ref 6–20)
CO2: 23 mmol/L (ref 22–32)
Calcium: 8.4 mg/dL — ABNORMAL LOW (ref 8.9–10.3)
Chloride: 104 mmol/L (ref 98–111)
Creatinine, Ser: 0.82 mg/dL (ref 0.44–1.00)
GFR calc Af Amer: >60 mL/min (ref >60)
GFR calc non Af Amer: >60 mL/min (ref >60)
Glucose, Bld: 80 mg/dL (ref 70–99)
Potassium: 4.4 mmol/L (ref 3.5–5.1)
Sodium: 134 mmol/L — ABNORMAL LOW (ref 135–145)

## 2019-03-29 LAB — RETICULOCYTES
Immature Retic Fract: 23.4 % — ABNORMAL HIGH (ref 2.3–15.9)
RBC.: 2.81 MIL/uL — ABNORMAL LOW (ref 3.87–5.11)
Retic Count, Absolute: 74.7 10*3/uL (ref 19.0–186.0)
Retic Ct Pct: 2.7 % (ref 0.4–3.1)

## 2019-03-29 LAB — HEPARIN LEVEL (UNFRACTIONATED): Heparin Unfractionated: 0.53 IU/mL (ref 0.30–0.70)

## 2019-03-29 LAB — FERRITIN: Ferritin: 160 ng/mL (ref 11–307)

## 2019-03-29 LAB — FOLATE: Folate: 8.1 ng/mL (ref >5.9)

## 2019-03-29 LAB — PROTIME-INR
INR: 2.3 — ABNORMAL HIGH (ref 0.8–1.2)
Prothrombin Time: 24.6 seconds — ABNORMAL HIGH (ref 11.4–15.2)

## 2019-03-29 MED ORDER — HEPARIN BOLUS VIA INFUSION
2000.0000 [IU] | Freq: Once | INTRAVENOUS | Status: AC
  Administered 2019-03-29: 2000 [IU] via INTRAVENOUS
  Filled 2019-03-29: qty 2000

## 2019-03-29 MED ORDER — HEPARIN (PORCINE) 25000 UT/250ML-% IV SOLN
950.0000 [IU]/h | INTRAVENOUS | Status: DC
  Administered 2019-03-29 – 2019-03-30 (×2): 800 [IU]/h via INTRAVENOUS
  Administered 2019-03-31: 950 [IU]/h via INTRAVENOUS
  Filled 2019-03-29 (×3): qty 250

## 2019-03-29 NOTE — Progress Notes (Signed)
Progress Note  Patient Name: Mary [name] Date of Encounter: 03/29/2019  Primary Cardiologist: Candee Furbish, MD   Subjective   Doing well this AM. Reviewed her most recent episode of syncope, which is her second since her cardiac surgery. She has questions about potential pacemaker. No chest pain, shortness of breath, PND, orthopnea.  Inpatient Medications    Scheduled Meds:  aspirin EC  81 mg Oral Daily   Continuous Infusions:  heparin 800 Units/hr (03/29/19 0720)   PRN Meds: acetaminophen **OR** acetaminophen   Vital Signs    Vitals:   03/28/19 1703 03/28/19 2112 03/29/19 0445 03/29/19 0513  BP: 121/86 116/69 121/87   Pulse: (!) 118 (!) 107 (!) 102   Resp: 18  18   Temp: 98.6 F (37 C) 98.6 F (37 C) 98.4 F (36.9 C)   TempSrc: Oral Oral Oral   SpO2: 100% 100% 100%   Weight: 55.1 kg   54.5 kg  Height: 5\' 2"  (1.575 m)      No intake or output data in the 24 hours ending 03/29/19 0851 Last 3 Weights 03/29/2019 03/28/2019 02/12/2019  Weight (lbs) 120 lb 2.4 oz 121 lb 8 oz 125 lb 3.2 oz  Weight (kg) 54.5 kg 55.112 kg 56.79 kg      Telemetry    Sinus tach, lowest is about 100 bpm when sleeping but up near 130 bpm intermittently. Occasional PVCs. I did not see a significant episode of tachycardia that was not sinus- Personally Reviewed  ECG    Sinus tach at 113 - Personally Reviewed  Physical Exam   GEN: No acute distress.   Neck: No JVD Cardiac: RRR, no murmurs, rubs, or gallops. Crisp mechanical mitral valve Respiratory: Clear to auscultation bilaterally. GI: Soft, nontender, non-distended  MS: No edema; No deformity. Neuro:  Nonfocal  Psych: Normal affect   Labs    High Sensitivity Troponin:  No results for input(s): TROPONINIHS in the last 720 hours.    Cardiac EnzymesNo results for input(s): TROPONINI in the last 168 hours. No results for input(s): TROPIPOC in the last 168 hours.   Chemistry Recent Labs  Lab 03/28/19 0040 03/28/19 1003  03/29/19 0349  NA 132* 134* 134*  K 4.2 4.5 4.4  CL 100 100 104  CO2 22 23 23   GLUCOSE 93 103* 80  BUN <5* 6 9  CREATININE 0.73 0.87 0.82  CALCIUM 8.4* 8.6* 8.4*  PROT 6.6 6.4*  --   ALBUMIN 3.0* 3.0*  --   AST 35 35  --   ALT 18 17  --   ALKPHOS 132* 139*  --   BILITOT 0.3 <0.1*  --   GFRNONAA >60 >60 >60  GFRAA >60 >60 >60  ANIONGAP 10 11 7      Hematology Recent Labs  Lab 03/28/19 0040 03/28/19 1003 03/29/19 0349  WBC 6.1 8.7 7.4  RBC 3.25* 3.14* 2.81*   2.81*  HGB 12.2 12.1 10.4*  HCT 35.6* 35.0* 31.0*  MCV 109.5* 111.5* 110.3*  MCH 37.5* 38.5* 37.0*  MCHC 34.3 34.6 33.5  RDW 15.2 15.6* 15.0  PLT 415* 373 351    BNPNo results for input(s): BNP, PROBNP in the last 168 hours.   DDimer No results for input(s): DDIMER in the last 168 hours.   Radiology    Ct Head Wo Contrast  Result Date: 03/28/2019 CLINICAL DATA:  Headache.  Fall. EXAM: CT HEAD WITHOUT CONTRAST CT CERVICAL SPINE WITHOUT CONTRAST TECHNIQUE: Multidetector CT imaging of the head and  cervical spine was performed following the standard protocol without intravenous contrast. Multiplanar CT image reconstructions of the cervical spine were also generated. COMPARISON:  12/19/2016 FINDINGS: CT HEAD FINDINGS Brain: No acute intracranial abnormality. Specifically, no hemorrhage, hydrocephalus, mass lesion, acute infarction, or significant intracranial injury. Vascular: No hyperdense vessel or unexpected calcification. Skull: No acute calvarial abnormality. Sinuses/Orbits: Visualized paranasal sinuses and mastoids clear. Orbital soft tissues unremarkable. Other: None CT CERVICAL SPINE FINDINGS Alignment: Normal Skull base and vertebrae: No acute fracture. No primary bone lesion or focal pathologic process. Soft tissues and spinal canal: No prevertebral fluid or swelling. No visible canal hematoma. Disc levels:  Normal Upper chest: Normal Other: None IMPRESSION: No acute intracranial abnormality. No acute bony  abnormality in the cervical spine. Electronically Signed   By: Rolm Baptise M.D.   On: 03/28/2019 00:19   Ct Cervical Spine Wo Contrast  Result Date: 03/28/2019 CLINICAL DATA:  Headache.  Fall. EXAM: CT HEAD WITHOUT CONTRAST CT CERVICAL SPINE WITHOUT CONTRAST TECHNIQUE: Multidetector CT imaging of the head and cervical spine was performed following the standard protocol without intravenous contrast. Multiplanar CT image reconstructions of the cervical spine were also generated. COMPARISON:  12/19/2016 FINDINGS: CT HEAD FINDINGS Brain: No acute intracranial abnormality. Specifically, no hemorrhage, hydrocephalus, mass lesion, acute infarction, or significant intracranial injury. Vascular: No hyperdense vessel or unexpected calcification. Skull: No acute calvarial abnormality. Sinuses/Orbits: Visualized paranasal sinuses and mastoids clear. Orbital soft tissues unremarkable. Other: None CT CERVICAL SPINE FINDINGS Alignment: Normal Skull base and vertebrae: No acute fracture. No primary bone lesion or focal pathologic process. Soft tissues and spinal canal: No prevertebral fluid or swelling. No visible canal hematoma. Disc levels:  Normal Upper chest: Normal Other: None IMPRESSION: No acute intracranial abnormality. No acute bony abnormality in the cervical spine. Electronically Signed   By: Rolm Baptise M.D.   On: 03/28/2019 00:19   Dg Chest Port 1 View  Result Date: 03/28/2019 CLINICAL DATA:  Cough EXAM: PORTABLE CHEST 1 VIEW COMPARISON:  05/24/2018 FINDINGS: No focal opacity or pleural effusion. Stable cardiomediastinal silhouette. Valve prostheses. No pneumothorax. IMPRESSION: No active disease. Electronically Signed   By: Donavan Foil M.D.   On: 03/28/2019 00:40    Cardiac Studies   Prior reviewed, none this admission. Most recent echo 03/06/19 personally reviewed  Patient Profile     58 y.o. female with PMH mechanical mitral valve and s/p TV repair, chronic diastolic heart failure, transient  complete heart block, hypertension, stroke. She is followed for an episode of syncope that occurred after palpitations.   Assessment & Plan    Recurrent syncope in the setting of known transient complete heart block and likely tachyarrhythmia: rhythm here has been sinus tach with occasional PVCs.  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 -I have let Dr. Lovena Le know that she will need to be seen by EP this weekend  Mechanical mitral valve, s/p tricuspid valve repair, chronic diastolic heart failure -appears euvolemic, received some IV fluids on admission -recent echo 03/06/19 stable -INR today 2.3, goal 2.5-3.5 for mechanical valve -no active bleeding, Hgb down today (12.1 -> 10.4), macrocytic anemia workup as per primary team  For questions or updates, please contact [facility] Please consult [email or web address] for contact info under     Signed, Buford Dresser, MD  03/29/2019, 8:51 AM

## 2019-03-29 NOTE — Progress Notes (Signed)
Nutrition Brief Note  Patient identified on the Malnutrition Screening Tool (MST) Report  Patient with insignificant weight loss recently. Pt is consuming 100% of meals at this time.   Wt Readings from Last 15 Encounters:  03/29/19 54.5 kg  02/12/19 56.8 kg  12/05/18 58.1 kg  11/20/18 58.5 kg  08/14/18 57.6 kg  05/31/18 58.1 kg  05/23/18 58.4 kg  04/24/18 58.5 kg  03/23/18 60.4 kg  01/22/18 60.1 kg  11/29/17 59 kg  04/18/17 62.6 kg  01/12/17 60 kg  01/10/17 60.4 kg  12/19/16 63 kg    Body mass index is 21.98 kg/m. Patient meets criteria for normal based on current BMI.   Current diet order is heart healthy, patient is consuming approximately 100% of meals at this time. Labs and medications reviewed.   No nutrition interventions warranted at this time. If nutrition issues arise, please consult RD.   Clayton Bibles, MS, RD, [place] Dietitian Pager: [phone] After Hours Pager: [phone]

## 2019-03-29 NOTE — Progress Notes (Signed)
PROGRESS NOTE  Mary [name] LKG:[id number] DOB: 02-Nov-1960 DOA: 03/27/2019 PCP: Cain Saupe, MD   LOS: 1 day   Patient is from: Home  Brief Narrative / Interim history: 58 year old female with history of mechanical mitral valve replacement, SVT, transient complete heart block, diastolic dysfunction and tricuspid valve repair admitted with syncopal episode.  In ED, tachycardic to low 100s.  Blood pressure within normal range.  CMP not impressive except for mild hyponatremia to 132 and ALP to 132.  CBC with macrocytosis.  UA with large Hgb.  INR 3.5.  COVID-19 negative.  CXR, CT head and cervical without acute finding.  EKG with sinus tachycardia, borderline QTC.  Recent echo basically normal.  Admitted for evaluation for syncope.  Cardiology consulted.  Subjective: No major events overnight of this morning.  She denies chest pain, dyspnea, dizziness or palpitation.  No GI or GU symptoms.  Reportedly have 6 beats of V. tach today per RN.  Reportedly felt some flutter during this.  Objective: Vitals:   03/28/19 2112 03/29/19 0445 03/29/19 0513 03/29/19 1131  BP: 116/69 121/87  112/76  Pulse: (!) 107 (!) 102  (!) 104  Resp:  18  (!) 25  Temp: 98.6 F (37 C) 98.4 F (36.9 C)  98.8 F (37.1 C)  TempSrc: Oral Oral  Oral  SpO2: 100% 100%  100%  Weight:   54.5 kg   Height:        Intake/Output Summary (Last 24 hours) at 03/29/2019 1504 Last data filed at 03/29/2019 1201 Gross per 24 hour  Intake 240 ml  Output -  Net 240 ml   Filed Weights   03/28/19 1703 03/29/19 0513  Weight: 55.1 kg 54.5 kg    Examination:  GENERAL: No acute distress.  Appears well.  HEENT: MMM.  Vision and hearing grossly intact.  NECK: Supple.  No apparent JVD LUNGS:  No IWOB. Good air movement bilaterally. HEART: Tachycardic to low 100s.  Regular rhythm.  Mechanical heart valve sounds. ABD: Bowel sounds present. Soft. Non tender.  MSK/EXT:  Moves all extremities. No apparent deformity. No edema  bilaterally.  SKIN: no apparent skin lesion or wound NEURO: Awake, alert and oriented appropriately.  No gross deficit.  PSYCH: Calm. Normal affect.  I have personally reviewed the following labs and images:  Radiology Studies: No results found.  Microbiology: Recent Results (from the past 240 hour(s))  SARS Coronavirus 2 (CEPHEID - Performed in [facility] hospital lab), Hosp Order     Status: None   Collection Time: 03/28/19  4:41 AM   Specimen: Nasopharyngeal Swab  Result Value Ref Range Status   SARS Coronavirus 2 NEGATIVE NEGATIVE Final    Comment: (NOTE) If result is NEGATIVE SARS-CoV-2 target nucleic acids are NOT DETECTED. The SARS-CoV-2 RNA is generally detectable in upper and lower  respiratory specimens during the acute phase of infection. The lowest  concentration of SARS-CoV-2 viral copies this assay can detect is 250  copies / mL. A negative result does not preclude SARS-CoV-2 infection  and should not be used as the sole basis for treatment or other  patient management decisions.  A negative result may occur with  improper specimen collection / handling, submission of specimen other  than nasopharyngeal swab, presence of viral mutation(s) within the  areas targeted by this assay, and inadequate number of viral copies  (<250 copies / mL). A negative result must be combined with clinical  observations, patient history, and epidemiological information. If result is POSITIVE SARS-CoV-2  target nucleic acids are DETECTED. The SARS-CoV-2 RNA is generally detectable in upper and lower  respiratory specimens dur ing the acute phase of infection.  Positive  results are indicative of active infection with SARS-CoV-2.  Clinical  correlation with patient history and other diagnostic information is  necessary to determine patient infection status.  Positive results do  not rule out bacterial infection or co-infection with other viruses. If result is PRESUMPTIVE POSTIVE  SARS-CoV-2 nucleic acids MAY BE PRESENT.   A presumptive positive result was obtained on the submitted specimen  and confirmed on repeat testing.  While 2019 novel coronavirus  (SARS-CoV-2) nucleic acids may be present in the submitted sample  additional confirmatory testing may be necessary for epidemiological  and / or clinical management purposes  to differentiate between  SARS-CoV-2 and other Sarbecovirus currently known to infect humans.  If clinically indicated additional testing with an alternate test  methodology [phone]) is advised. The SARS-CoV-2 RNA is generally  detectable in upper and lower respiratory sp ecimens during the acute  phase of infection. The expected result is Negative. Fact Sheet for Patients:  BoilerBrush.com.cy Fact Sheet for Healthcare Providers: https://pope.com/ This test is not yet approved or cleared by the [place] FDA and has been authorized for detection and/or diagnosis of SARS-CoV-2 by FDA under an Emergency Use Authorization (EUA).  This EUA will remain in effect (meaning this test can be used) for the duration of the COVID-19 declaration under Section 564(b)(1) of the Act, 21 U.S.C. section 360bbb-3(b)(1), unless the authorization is terminated or revoked sooner. Performed at [facility], [address] [place], [place] [address]     Sepsis Labs: Invalid input(s): PROCALCITONIN, LACTICIDVEN  Urine analysis:    Component Value Date/Time   COLORURINE YELLOW 03/28/2019 0040   APPEARANCEUR CLEAR 03/28/2019 0040   LABSPEC 1.003 (L) 03/28/2019 0040   PHURINE 6.0 03/28/2019 0040   GLUCOSEU NEGATIVE 03/28/2019 0040   HGBUR LARGE (A) 03/28/2019 0040   BILIRUBINUR NEGATIVE 03/28/2019 0040   KETONESUR NEGATIVE 03/28/2019 0040   PROTEINUR NEGATIVE 03/28/2019 0040   UROBILINOGEN 0.2 06/04/2015 1533   NITRITE NEGATIVE 03/28/2019 0040   LEUKOCYTESUR NEGATIVE 03/28/2019  0040    Anemia Panel: Recent Labs    03/29/19 0349  VITAMINB12 211  FOLATE 8.1  FERRITIN 160  TIBC 292  IRON 176*  RETICCTPCT 2.7    Thyroid Function Tests: Recent Labs    03/28/19 1003  TSH 2.481  FREET4 0.69    Lipid Profile: No results for input(s): CHOL, HDL, LDLCALC, TRIG, CHOLHDL, LDLDIRECT in the last 72 hours.  CBG: No results for input(s): GLUCAP in the last 168 hours.  HbA1C: No results for input(s): HGBA1C in the last 72 hours.  BNP (last 3 results): No results for input(s): PROBNP in the last 8760 hours.  Cardiac Enzymes: No results for input(s): CKTOTAL, CKMB, CKMBINDEX, TROPONINI in the last 168 hours.  Coagulation Profile: Recent Labs  Lab 03/28/19 0040 03/29/19 0349  INR 3.5* 2.3*    Liver Function Tests: Recent Labs  Lab 03/28/19 0040 03/28/19 1003  AST 35 35  ALT 18 17  ALKPHOS 132* 139*  BILITOT 0.3 <0.1*  PROT 6.6 6.4*  ALBUMIN 3.0* 3.0*   No results for input(s): LIPASE, AMYLASE in the last 168 hours. No results for input(s): AMMONIA in the last 168 hours.  Basic Metabolic Panel: Recent Labs  Lab 03/28/19 0040 03/28/19 1003 03/29/19 0349  NA 132* 134* 134*  K 4.2 4.5 4.4  CL 100 100 104  CO2 22 23 23   GLUCOSE 93 103* 80  BUN <5* 6 9  CREATININE 0.73 0.87 0.82  CALCIUM 8.4* 8.6* 8.4*   GFR: Estimated Creatinine Clearance: 60.6 mL/min (by C-G formula based on SCr of 0.82 mg/dL).  CBC: Recent Labs  Lab 03/28/19 0040 03/28/19 1003 03/29/19 0349  WBC 6.1 8.7 7.4  NEUTROABS 3.5 6.2  --   HGB 12.2 12.1 10.4*  HCT 35.6* 35.0* 31.0*  MCV 109.5* 111.5* 110.3*  PLT 415* 373 351    Procedures:  None  Microbiology: COVID-19 negative  Assessment & Plan: Syncopal episode: Concern for arrhythmia given history of SVT and transient CHB.  Telemetry strips reads sinus tachycardia.  Recent echo reassuring. -Appreciate cardiology input-holding Coumadin in anticipation for pacemaker placement on Monday -Heparin gtt  for mechanical valve -Cardiology to consult EP  Mechanical MV/history of TV repair: On warfarin for anticoagulation.  INR therapeutic. -Anticoagulation plan as above  Dyspnea/orthopnea/PND: Resolved.  Appears euvolemic. Recent echocardiogram reassuring. -We will continue monitoring.  DVT prophylaxis: Heparin gtt Code Status: Full code Family Communication: Patient update family let me know if any question. Disposition Plan: Remains inpatient for further evaluation and management of syncope and consideration for pacemaker placement Consultants: Cardiology  Antimicrobials: Anti-infectives (From admission, onward)   None      Sch Meds:  Scheduled Meds: . aspirin EC  81 mg Oral Daily   Continuous Infusions: . heparin 800 Units/hr (03/29/19 0720)   PRN Meds:.acetaminophen **OR** acetaminophen  Jermiah Soderman T. Flynn Lininger [facility]  If 7PM-7AM, please contact night-coverage [email or web address] Password [email or web address] 03/29/2019, 3:04 PM

## 2019-03-29 NOTE — Progress Notes (Signed)
ANTICOAGULATION CONSULT NOTE - Follow Up Consult  Pharmacy Consult for heparin  Indication: mechanical MVR - bridge therapy while warfarin on hold  Allergies  Allergen Reactions  . Oxycodone Itching    Patient Measurements: Height: 5\' 2"  (157.5 cm) Weight: 120 lb 2.4 oz (54.5 kg) IBW/kg (Calculated) : 50.1 .  Vital Signs: Temp: 98.4 F (36.9 C) (07/03 0445) Temp Source: Oral (07/03 0445) BP: 121/87 (07/03 0445) Pulse Rate: 102 (07/03 0445)  Labs: Recent Labs    03/28/19 0040 03/28/19 1003 03/29/19 0349  HGB 12.2 12.1 10.4*  HCT 35.6* 35.0* 31.0*  PLT 415* 373 351  LABPROT 34.6*  --  24.6*  INR 3.5*  --  2.3*  CREATININE 0.73 0.87 0.82    Estimated Creatinine Clearance: 60.6 mL/min (by C-G formula based on SCr of 0.82 mg/dL).   Medications:  Medications Prior to Admission  Medication Sig Dispense Refill Last Dose  . aspirin EC 81 MG tablet Take 1 tablet (81 mg total) by mouth daily.   03/27/2019 at 930a  . fluticasone (FLONASE) 50 MCG/ACT nasal spray Place 1 spray into both nostrils daily as needed for allergies or rhinitis.   03/26/2019 at Unknown time  . Hyprom-Naphaz-Polysorb-Zn Sulf (CLEAR EYES COMPLETE OP) Place 2 drops into both eyes daily as needed (red eyes).   Past Month at Unknown time  . saccharomyces boulardii (FLORASTOR) 250 MG capsule Take 1 capsule (250 mg total) by mouth 2 (two) times daily. 60 capsule 0 Past Month at Unknown time  . warfarin (COUMADIN) 5 MG tablet Take as directed by coumadin clinic (Patient taking differently: Take 5-7.5 mg by mouth daily. M,W,and f 7.5 mg tablet. Tue, Thursday and Saturday and Sunday 5 mg) 50 tablet 0 03/26/2019 at 6pm    Assessment: 58 yo F on warfarin PTA for on PTA for mechanical mitral valve.  Pharmacy consulted to dose heparin for bridge therapy while warfarin on hold for pacemaker placement planned Monday 7/6.[name]  Home warfarin per coumadin clinic dosing: 7.5 mg MWF and 5 mg TTSS. Last dose 6/30 at 1800.  INR goal  2.5 - 3.6 Admission INR therapeutic at 3.5.  HG 12.1, PLTC 373.  No bleeding reported.   . Goal of Therapy:  Heparin level 0.3-0.7 units/ml Monitor platelets by anticoagulation protocol: Yes  Today, 03/29/2019: INR falling as expected, now subtherapeutic (2.3) Hgb slightly low.  Pltc WNL  Plan:  Start heparin as per previous cardiology orders, with pharmacy dosing assistance:  Heparin 2000 units IV bolus  Heparin 800 units/hr IV infusion  Heparin level at 2 pm  Daily heparin level, INR, CBC  Follow clinical course and for any reports of bleeding.  Clayburn Pert, PharmD, BCPS [phone] 03/29/2019  6:50 AM   .

## 2019-03-29 NOTE — Plan of Care (Signed)
Pt understands her POC. Pt denied pain this shift.

## 2019-03-29 NOTE — Progress Notes (Addendum)
Patient refusing bed alarm at this time.  States she wants to go to the bathroom independently.  Writer made patient aware of the risk of falling without a staff member in the room and that she is a high fall risk due to her syncopal episode prior to admission.  Patient stated she understands the risk.

## 2019-03-29 NOTE — Progress Notes (Signed)
6 beat run of v-tach reported from [place].  Pt stated she felt a slight flutter.  MD made aware.  Vitals stable.

## 2019-03-29 NOTE — Progress Notes (Signed)
ANTICOAGULATION CONSULT NOTE - Follow Up Consult  Pharmacy Consult for heparin  Indication: mechanical MVR - bridge therapy while warfarin on hold  Allergies  Allergen Reactions  . Oxycodone Itching    Patient Measurements: Height: 5\' 2"  (157.5 cm) Weight: 120 lb 2.4 oz (54.5 kg) IBW/kg (Calculated) : 50.1 .  Vital Signs: Temp: 98.8 F (37.1 C) (07/03 1131) Temp Source: Oral (07/03 1131) BP: 112/76 (07/03 1131) Pulse Rate: 104 (07/03 1131)  Labs: Recent Labs    03/28/19 0040 03/28/19 1003 03/29/19 0349 03/29/19 1404  HGB 12.2 12.1 10.4*  --   HCT 35.6* 35.0* 31.0*  --   PLT 415* 373 351  --   LABPROT 34.6*  --  24.6*  --   INR 3.5*  --  2.3*  --   HEPARINUNFRC  --   --   --  0.53  CREATININE 0.73 0.87 0.82  --     Estimated Creatinine Clearance: 60.6 mL/min (by C-G formula based on SCr of 0.82 mg/dL).  Assessment: 58 yo F on warfarin PTA for mechanical mitral valve.  Pharmacy consulted to dose heparin for bridge therapy while warfarin on hold for pacemaker placement planned Monday 7/6.[name]  Home warfarin per coumadin clinic dosing: 7.5 mg MWF and 5 mg TTSS. Last dose 6/30 at 1800.  INR goal 2.5 - 3.6 Admission INR therapeutic at 3.5.  HG 12.1, PLTC 373.  No bleeding reported.   . Today, 03/29/2019: INR falling as expected, now subtherapeutic (2.3) Heparin level therapeutic at 0.53 after heparin started with 2000 unit bolus and drip at 800 units/hr. Hgb slightly low.  Pltc WNL No bleeding reported  Goal of Therapy:   Heparin level 0.3-0.7 units/ml Monitor platelets by anticoagulation protocol: Yes   Plan:   Continue Heparin 800 units/hr IV infusion  Daily heparin level, INR, CBC  Follow clinical course and for any reports of bleeding.  F/u for pacemaker plans and plans to hold heparin preop and resume coumadin post op  Eudelia Bunch, Pharm.D 03/29/2019 3:07 PM

## 2019-03-30 DIAGNOSIS — W19XXXA Unspecified fall, initial encounter: Secondary | ICD-10-CM

## 2019-03-30 DIAGNOSIS — R002 Palpitations: Secondary | ICD-10-CM

## 2019-03-30 DIAGNOSIS — M542 Cervicalgia: Secondary | ICD-10-CM

## 2019-03-30 DIAGNOSIS — R51 Headache: Secondary | ICD-10-CM

## 2019-03-30 DIAGNOSIS — I442 Atrioventricular block, complete: Secondary | ICD-10-CM

## 2019-03-30 LAB — CBC
HCT: 28.2 % — ABNORMAL LOW (ref 36.0–46.0)
Hemoglobin: 9.5 g/dL — ABNORMAL LOW (ref 12.0–15.0)
MCH: 38.3 pg — ABNORMAL HIGH (ref 26.0–34.0)
MCHC: 33.7 g/dL (ref 30.0–36.0)
MCV: 113.7 fL — ABNORMAL HIGH (ref 80.0–100.0)
Platelets: 313 10*3/uL (ref 150–400)
RBC: 2.48 MIL/uL — ABNORMAL LOW (ref 3.87–5.11)
RDW: 14.8 % (ref 11.5–15.5)
WBC: 8.5 10*3/uL (ref 4.0–10.5)
nRBC: 0 % (ref 0.0–0.2)

## 2019-03-30 LAB — BASIC METABOLIC PANEL WITH GFR
Anion gap: 9 (ref 5–15)
BUN: 14 mg/dL (ref 6–20)
CO2: 21 mmol/L — ABNORMAL LOW (ref 22–32)
Calcium: 8.3 mg/dL — ABNORMAL LOW (ref 8.9–10.3)
Chloride: 104 mmol/L (ref 98–111)
Creatinine, Ser: 0.85 mg/dL (ref 0.44–1.00)
GFR calc Af Amer: >60 mL/min
GFR calc non Af Amer: >60 mL/min
Glucose, Bld: 84 mg/dL (ref 70–99)
Potassium: 3.9 mmol/L (ref 3.5–5.1)
Sodium: 134 mmol/L — ABNORMAL LOW (ref 135–145)

## 2019-03-30 LAB — PROTIME-INR
INR: 1.4 — ABNORMAL HIGH (ref 0.8–1.2)
Prothrombin Time: 16.7 s — ABNORMAL HIGH (ref 11.4–15.2)

## 2019-03-30 LAB — HEPARIN LEVEL (UNFRACTIONATED): Heparin Unfractionated: 0.62 IU/mL (ref 0.30–0.70)

## 2019-03-30 LAB — MAGNESIUM: Magnesium: 1.7 mg/dL (ref 1.7–2.4)

## 2019-03-30 MED ORDER — WARFARIN - PHYSICIAN DOSING INPATIENT
Freq: Every day | Status: DC
  Administered 2019-03-30: 19:00:00

## 2019-03-30 MED ORDER — WARFARIN SODIUM 5 MG PO TABS
5.0000 mg | ORAL_TABLET | Freq: Once | ORAL | Status: AC
  Administered 2019-03-30: 5 mg via ORAL
  Filled 2019-03-30: qty 1

## 2019-03-30 NOTE — Consult Note (Signed)
Cardiology Consultation:   Patient ID: Mary [name] MRN: [id number]; DOB: 22-Aug-1961  Admit date: 03/27/2019 Date of Consult: 03/30/2019  Primary Care Provider: Antony Blackbird, MD Primary Cardiologist: Candee Furbish, MD  Primary Electrophysiologist:  Cristopher Peru, MD    Patient Profile:   Mary [name] is a 58 y.o. female with a hx of syncope and transient AV block who is being seen today for the evaluation of syncope at the request of Dr. Meda Coffee.  History of Present Illness:   Mary [name] was seen by me a year ago. At the time she had worn a cardiac monitor for syncope and was noted to have transient CHB with HR's in the 20-30 range for a few seconds. She was recommended to undergo PPM but she refused and did well initially until presenting a couple of days ago with syncope. The episodes was with minimal warning. She was talking to her mother on the phone. Unclear if she had a warning though she says she new she was about to go out. She was awake almost immediately and did not particularly feel bad. Since she has been in the hospital on the monitor she has not had any arrhythmias except for sinus tachycardia. Of note she has not been worked up for PE but is on systemic anti-coagulation and has been therapeutic.  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 Echo 4/16:  Mild focal basal septal hypertrophy, EF 60-65%, no RWMA, Mechanical MVR ok with mild central regurgitation and no perivalvular leak, small mobile density attached to valvular ring (1.5x1 cm) - post  surgical changes vs SBE, mild LAE;   b. 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   Complex valvuloplasty including Cor-matrix ECM patch augmentation of anterior and lateral leaflets with 18mm Edwards mc3 ring annuloplasty via right mini-thoracotomy approach   Severe mitral regurgitation 04/22/2013   Stroke [facility])    SVT (supraventricular tachycardia) ([place]) 03/16/2013   Tricuspid regurgitation     Past Surgical History:  Procedure Laterality Date   APPENDECTOMY  ~1978   CARDIAC CATHETERIZATION     CARDIAC VALVE REPLACEMENT     CESAREAN SECTION  1983   CESAREAN SECTION WITH BILATERAL TUBAL LIGATION  1999   FEMORAL HERNIA REPAIR Right 05/23/2013   Procedure: HERNIA REPAIR FEMORAL;  Surgeon: Rexene Alberts, MD;  Location: [place];  Service: Open Heart Surgery;  Laterality: Right;   INTRAOPERATIVE TRANSESOPHAGEAL ECHOCARDIOGRAM N/A 05/23/2013   Procedure: INTRAOPERATIVE TRANSESOPHAGEAL ECHOCARDIOGRAM;  Surgeon: Rexene Alberts, MD;  Location: [place];  Service: Open Heart Surgery;  Laterality: N/A;   LAPAROSCOPIC CHOLECYSTECTOMY  2003  LEFT AND RIGHT HEART CATHETERIZATION WITH CORONARY ANGIOGRAM N/A 03/22/2013   Procedure: LEFT AND RIGHT HEART CATHETERIZATION WITH CORONARY ANGIOGRAM;  Surgeon: Burnell Blanks, MD;  Location: [facility] CATH LAB;  Service: Cardiovascular;  Laterality: N/A;   MINIMALLY INVASIVE TRICUSPID VALVE REPAIR Right 05/23/2013   Procedure: MINIMALLY INVASIVE TRICUSPID VALVE REPAIR;  Surgeon: Rexene Alberts, MD;  Location: [place];  Service: Open Heart Surgery;  Laterality: Right;   MITRAL VALVE REPLACEMENT N/A 05/23/2013    Procedure: MITRAL VALVE (MV) REPLACEMENT;  Surgeon: Rexene Alberts, MD;  Location: [place];  Service: Open Heart Surgery;  Laterality: N/A;   MULTIPLE EXTRACTIONS WITH ALVEOLOPLASTY N/A 04/04/2013   Procedure: Extraction of tooth #'s 1,8,9,13,14,15,23,24,25,26 with alveoloplasty and gross debridement of remaining teeth;  Surgeon: Lenn Cal, DDS;  Location: [place];  Service: Oral Surgery;  Laterality: N/A;   TEE WITHOUT CARDIOVERSION N/A 03/18/2013   Procedure: TRANSESOPHAGEAL ECHOCARDIOGRAM (TEE);  Surgeon: Larey Dresser, MD;  Location: [place];  Service: Cardiovascular;  Laterality: N/A;   TEE WITHOUT CARDIOVERSION N/A 06/17/2013   Procedure: TRANSESOPHAGEAL ECHOCARDIOGRAM (TEE);  Surgeon: Lelon Perla, MD;  Location: [facility] ENDOSCOPY;  Service: Cardiovascular;  Laterality: N/A;   TUBAL LIGATION  1999     Home Medications:  Prior to Admission medications   Medication Sig Start Date End Date Taking? Authorizing Provider  aspirin EC 81 MG tablet Take 1 tablet (81 mg total) by mouth daily. 08/26/13  Yes Larey Dresser, MD  fluticasone [facility]) 50 MCG/ACT nasal spray Place 1 spray into both nostrils daily as needed for allergies or rhinitis.   Yes [provider]  Hyprom-Naphaz-Polysorb-Zn Sulf (CLEAR EYES COMPLETE OP) Place 2 drops into both eyes daily as needed (red eyes).   Yes [provider]  saccharomyces boulardii (FLORASTOR) 250 MG capsule Take 1 capsule (250 mg total) by mouth 2 (two) times daily. 12/07/18  Yes Georgette Shell, MD  warfarin (COUMADIN) 5 MG tablet Take as directed by coumadin clinic Patient taking differently: Take 5-7.5 mg by mouth daily. M,W,and f 7.5 mg tablet. Tue, Thursday and Saturday and Sunday 5 mg 02/20/19  Yes Evans Lance, MD    Inpatient Medications: Scheduled Meds:  aspirin EC  81 mg Oral Daily   Continuous Infusions:  heparin 800 Units/hr (03/30/19 0818)   PRN Meds: acetaminophen **OR**  acetaminophen  Allergies:    Allergies  Allergen Reactions   Oxycodone Itching    Social History:   Social History   Socioeconomic History   Marital status: Single    Spouse name: Not on file   Number of children: Not on file   Years of education: Not on file   Highest education level: Not on file  Occupational History   Not on file  Social Needs   Financial resource strain: Not on file   Food insecurity    Worry: Not on file    Inability: Not on file   Transportation needs    Medical: Not on file    Non-medical: Not on file  Tobacco Use   Smoking status: Former Smoker    Packs/day: 0.50    Years: 36.00    Pack years: 18.00    Types: Cigarettes    Quit date: 03/15/2013    Years since quitting: 6.0   Smokeless tobacco: Never Used  Substance and Sexual Activity   Alcohol use: Yes    Comment: 01/11/2017  "glass of wine/month"   Drug use: No   Sexual activity:  Never  Lifestyle   Physical activity    Days per week: Not on file    Minutes per session: Not on file   Stress: Not on file  Relationships   Social connections    Talks on phone: Not on file    Gets together: Not on file    Attends religious service: Not on file    Active member of club or organization: Not on file    Attends meetings of clubs or organizations: Not on file    Relationship status: Not on file   Intimate partner violence    Fear of current or ex partner: Not on file    Emotionally abused: Not on file    Physically abused: Not on file    Forced sexual activity: Not on file  Other Topics Concern   Not on file  Social History Narrative   Not on file    Family History:    Family History  Problem Relation Age of Onset   Cancer Mother    Stroke Father    Sarcoidosis Sister    Heart attack Neg Hx      ROS:  Please see the history of present illness.   All other ROS reviewed and negative.     Physical Exam/Data:   Vitals:   03/29/19 2119 03/30/19 0435  03/30/19 0714 03/30/19 1420  BP: 122/73 117/80  112/79  Pulse: 100 (!) 102  (!) 104  Resp:  18    Temp: 98.3 F (36.8 C) 98.3 F (36.8 C)  98.3 F (36.8 C)  TempSrc: Oral Oral  Oral  SpO2: 100% 100%  100%  Weight:   54.7 kg   Height:        Intake/Output Summary (Last 24 hours) at 03/30/2019 1606 Last data filed at 03/29/2019 1751 Gross per 24 hour  Intake 80 ml  Output --  Net 80 ml   Last 3 Weights 03/30/2019 03/29/2019 03/28/2019  Weight (lbs) 120 lb 9.6 oz 120 lb 2.4 oz 121 lb 8 oz  Weight (kg) 54.704 kg 54.5 kg 55.112 kg     Body mass index is 22.06 kg/m.  General:  Well nourished, well developed, in no acute distress HEENT: normal Lymph: no adenopathy Neck: no JVD Endocrine:  No thryomegaly Vascular: No carotid bruits; FA pulses 2+ bilaterally without bruits  Cardiac:  RRR Lungs:  No increased work of breathing Abd: soft, nontender, no hepatomegaly  Ext: no edema Musculoskeletal:  No deformities, BUE and BLE strength normal and equal Skin: warm and dry  Neuro:  CNs 2-12 intact, no focal abnormalities noted Psych:  Normal affect   EKG:  The EKG was personally reviewed and demonstrates:  Sinus tachycardia  Telemetry:  Telemetry was personally reviewed and demonstrates sinus tachy with PVC"s  Relevant CV Studies: none  Laboratory Data:  High Sensitivity Troponin:  No results for input(s): TROPONINIHS in the last 720 hours.   Cardiac EnzymesNo results for input(s): TROPONINI in the last 168 hours. No results for input(s): TROPIPOC in the last 168 hours.  Chemistry Recent Labs  Lab 03/28/19 1003 03/29/19 0349 03/30/19 0402  NA 134* 134* 134*  K 4.5 4.4 3.9  CL 100 104 104  CO2 23 23 21*  GLUCOSE 103* 80 84  BUN 6 9 14   CREATININE 0.87 0.82 0.85  CALCIUM 8.6* 8.4* 8.3*  GFRNONAA >60 >60 >60  GFRAA >60 >60 >60  ANIONGAP 11 7 9     Recent Labs  Lab 03/28/19 0040 03/28/19  1003  PROT 6.6 6.4*  ALBUMIN 3.0* 3.0*  AST 35 35  ALT 18 17  ALKPHOS 132* 139*   BILITOT 0.3 <0.1*   Hematology Recent Labs  Lab 03/28/19 1003 03/29/19 0349 03/30/19 0402  WBC 8.7 7.4 8.5  RBC 3.14* 2.81*   2.81* 2.48*  HGB 12.1 10.4* 9.5*  HCT 35.0* 31.0* 28.2*  MCV 111.5* 110.3* 113.7*  MCH 38.5* 37.0* 38.3*  MCHC 34.6 33.5 33.7  RDW 15.6* 15.0 14.8  PLT 373 351 313   BNPNo results for input(s): BNP, PROBNP in the last 168 hours.  DDimer No results for input(s): DDIMER in the last 168 hours.   Radiology/Studies:  Ct Head Wo Contrast  Result Date: 03/28/2019 CLINICAL DATA:  Headache.  Fall. EXAM: CT HEAD WITHOUT CONTRAST CT CERVICAL SPINE WITHOUT CONTRAST TECHNIQUE: Multidetector CT imaging of the head and cervical spine was performed following the standard protocol without intravenous contrast. Multiplanar CT image reconstructions of the cervical spine were also generated. COMPARISON:  12/19/2016 FINDINGS: CT HEAD FINDINGS Brain: No acute intracranial abnormality. Specifically, no hemorrhage, hydrocephalus, mass lesion, acute infarction, or significant intracranial injury. Vascular: No hyperdense vessel or unexpected calcification. Skull: No acute calvarial abnormality. Sinuses/Orbits: Visualized paranasal sinuses and mastoids clear. Orbital soft tissues unremarkable. Other: None CT CERVICAL SPINE FINDINGS Alignment: Normal Skull base and vertebrae: No acute fracture. No primary bone lesion or focal pathologic process. Soft tissues and spinal canal: No prevertebral fluid or swelling. No visible canal hematoma. Disc levels:  Normal Upper chest: Normal Other: None IMPRESSION: No acute intracranial abnormality. No acute bony abnormality in the cervical spine. Electronically Signed   By: Rolm Baptise M.D.   On: 03/28/2019 00:19   Ct Cervical Spine Wo Contrast  Result Date: 03/28/2019 CLINICAL DATA:  Headache.  Fall. EXAM: CT HEAD WITHOUT CONTRAST CT CERVICAL SPINE WITHOUT CONTRAST TECHNIQUE: Multidetector CT imaging of the head and cervical spine was performed  following the standard protocol without intravenous contrast. Multiplanar CT image reconstructions of the cervical spine were also generated. COMPARISON:  12/19/2016 FINDINGS: CT HEAD FINDINGS Brain: No acute intracranial abnormality. Specifically, no hemorrhage, hydrocephalus, mass lesion, acute infarction, or significant intracranial injury. Vascular: No hyperdense vessel or unexpected calcification. Skull: No acute calvarial abnormality. Sinuses/Orbits: Visualized paranasal sinuses and mastoids clear. Orbital soft tissues unremarkable. Other: None CT CERVICAL SPINE FINDINGS Alignment: Normal Skull base and vertebrae: No acute fracture. No primary bone lesion or focal pathologic process. Soft tissues and spinal canal: No prevertebral fluid or swelling. No visible canal hematoma. Disc levels:  Normal Upper chest: Normal Other: None IMPRESSION: No acute intracranial abnormality. No acute bony abnormality in the cervical spine. Electronically Signed   By: Rolm Baptise M.D.   On: 03/28/2019 00:19   Dg Chest Port 1 View  Result Date: 03/28/2019 CLINICAL DATA:  Cough EXAM: PORTABLE CHEST 1 VIEW COMPARISON:  05/24/2018 FINDINGS: No focal opacity or pleural effusion. Stable cardiomediastinal silhouette. Valve prostheses. No pneumothorax. IMPRESSION: No active disease. Electronically Signed   By: Donavan Foil M.D.   On: 03/28/2019 00:40    Assessment and Plan:   Syncope - the etiology is unclear. She has had transient daytime AV block in the past but that was also not associated with symptoms. I gave her the option of PPM emperically with her h/o transient (asymptomatic) AV block or placement of an ILR as an outpatient. She would prefer the loop recorder (as would I ) to be certain that a PPM would actually benefit her. If she  has no arrhythmias tonight she could be discharged home tomorrow and return to my office for an ILR on Monday. If she were to develop symptomatic AV block or sinus node dysfunction tonight  then we would place PPM tomorrow or Monday.  2. Coags - as we do not plan PPM, she will be restarted back on coumadin and continue the IV heparin.   For questions or updates, please contact [place] Please consult [email or web address] for contact info under   Signed, Cristopher Peru, MD  03/30/2019 4:06 PM

## 2019-03-30 NOTE — Progress Notes (Signed)
[facility]                                                                              Patient Demographics  Mary [name], is a 58 y.o. female, DOB - 1960-11-03, WUJ:[id number]  Admit date - 03/27/2019   Admitting Physician Eduard Clos, MD  Outpatient Primary MD for the patient is Cain Saupe, MD  Outpatient specialists:   LOS - 2  days   Medical records reviewed and are as summarized below:    Chief Complaint  Patient presents with   Headache       Brief summary   Patient is a 58 year old female with history of mechanical mitral valve, SVT, transient complete heart block, diastolic dysfunction, tricuspid valve repair was admitted with syncopal episode. In ED, tachycardia with heart rate in low 100s, mild hyponatremia 132.  Patient was admitted for syncope.  Cardiology was consulted. COVID-19 test negative  Assessment & Plan    Principal Problem: Recurrent syncope in the setting of extensive cardiac history and arrhythmia given history of SVT, transient heart block. 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 -EP cardiology to see for pacemaker evaluation -TSH 2.4, free T4 0.69  Active Problems:   H/O mitral valve replacement with mechanical valve, S/P tricuspid valve repair -Continue IV heparin drip, Coumadin on hold.  INR 1.4. -Recent 2D echo in 02/2019 showed EF > 65%  Chronic diastolic CHF -Currently euvolemic, compensated, stable -Follow I's and O's closely  Code Status: Full CODE STATUS DVT Prophylaxis: Heparin Family Communication: Discussed in detail with the patient, all imaging results, lab results explained to the patient    Disposition Plan: Awaiting EP cardiology recommendations  Time Spent in minutes   25 minutes  Procedures:  None  Consultants:     Cardiology, EP cardiology  Antimicrobials:   Anti-infectives (From admission, onward)   None          Medications  Scheduled Meds:  aspirin EC  81 mg Oral Daily   Continuous Infusions:  heparin 800 Units/hr (03/30/19 0818)   PRN Meds:.acetaminophen **OR** acetaminophen      Subjective:   Mary [name] was seen and examined today.  Ambulating in the room without any difficulty, heart rate in low 100s. Patient denies dizziness, chest pain, shortness of breath, abdominal pain, N/V/D/C, new weakness, numbess, tingling. No acute events overnight.    Objective:   Vitals:   03/29/19 1131 03/29/19 2119 03/30/19 0435 03/30/19 0714  BP: 112/76 122/73 117/80   Pulse: (!) 104 100 (!) 102   Resp: (!) 25  18   Temp: 98.8 F (37.1 C) 98.3 F (36.8 C) 98.3 F (36.8 C)   TempSrc: Oral Oral Oral   SpO2: 100% 100% 100%   Weight:    54.7 kg  Height:        Intake/Output Summary (Last 24 hours) at 03/30/2019 0958 Last data filed at 03/29/2019 1751 Gross per 24 hour  Intake 320 ml  Output --  Net 320 ml  Wt Readings from Last 3 Encounters:  03/30/19 54.7 kg  02/12/19 56.8 kg  12/05/18 58.1 kg     Exam  General: Alert and oriented x 3, NAD  Eyes:   HEENT:  Atraumatic, normocephalic, normal oropharynx  Cardiovascular: S1 S2 auscultated, mechanical mitral valve click, tachycardia. RRR  Respiratory: Clear to auscultation bilaterally, no wheezing, rales or rhonchi  Gastrointestinal: Soft, nontender, nondistended, + bowel sounds  Ext: no pedal edema bilaterally  Neuro: No new deficits  Musculoskeletal: No digital cyanosis, clubbing  Skin: No rashes  Psych: Normal affect and demeanor, alert and oriented x3    Data Reviewed:  I have personally reviewed following labs and imaging studies  Micro Results Recent Results (from the past 240 hour(s))  SARS Coronavirus 2 (CEPHEID - Performed in [facility] hospital lab), Hosp Order     Status: None    Collection Time: 03/28/19  4:41 AM   Specimen: Nasopharyngeal Swab  Result Value Ref Range Status   SARS Coronavirus 2 NEGATIVE NEGATIVE Final    Comment: (NOTE) If result is NEGATIVE SARS-CoV-2 target nucleic acids are NOT DETECTED. The SARS-CoV-2 RNA is generally detectable in upper and lower  respiratory specimens during the acute phase of infection. The lowest  concentration of SARS-CoV-2 viral copies this assay can detect is 250  copies / mL. A negative result does not preclude SARS-CoV-2 infection  and should not be used as the sole basis for treatment or other  patient management decisions.  A negative result may occur with  improper specimen collection / handling, submission of specimen other  than nasopharyngeal swab, presence of viral mutation(s) within the  areas targeted by this assay, and inadequate number of viral copies  (<250 copies / mL). A negative result must be combined with clinical  observations, patient history, and epidemiological information. If result is POSITIVE SARS-CoV-2 target nucleic acids are DETECTED. The SARS-CoV-2 RNA is generally detectable in upper and lower  respiratory specimens dur ing the acute phase of infection.  Positive  results are indicative of active infection with SARS-CoV-2.  Clinical  correlation with patient history and other diagnostic information is  necessary to determine patient infection status.  Positive results do  not rule out bacterial infection or co-infection with other viruses. If result is PRESUMPTIVE POSTIVE SARS-CoV-2 nucleic acids MAY BE PRESENT.   A presumptive positive result was obtained on the submitted specimen  and confirmed on repeat testing.  While 2019 novel coronavirus  (SARS-CoV-2) nucleic acids may be present in the submitted sample  additional confirmatory testing may be necessary for epidemiological  and / or clinical management purposes  to differentiate between  SARS-CoV-2 and other Sarbecovirus  currently known to infect humans.  If clinically indicated additional testing with an alternate test  methodology [phone]) is advised. The SARS-CoV-2 RNA is generally  detectable in upper and lower respiratory sp ecimens during the acute  phase of infection. The expected result is Negative. Fact Sheet for Patients:  BoilerBrush.com.cy Fact Sheet for Healthcare Providers: https://pope.com/ This test is not yet approved or cleared by the [place] FDA and has been authorized for detection and/or diagnosis of SARS-CoV-2 by FDA under an Emergency Use Authorization (EUA).  This EUA will remain in effect (meaning this test can be used) for the duration of the COVID-19 declaration under Section 564(b)(1) of the Act, 21 U.S.C. section 360bbb-3(b)(1), unless the authorization is terminated or revoked sooner. Performed at [facility], [address], [place], [place] [address]  Radiology Reports Ct Head Wo Contrast  Result Date: 03/28/2019 CLINICAL DATA:  Headache.  Fall. EXAM: CT HEAD WITHOUT CONTRAST CT CERVICAL SPINE WITHOUT CONTRAST TECHNIQUE: Multidetector CT imaging of the head and cervical spine was performed following the standard protocol without intravenous contrast. Multiplanar CT image reconstructions of the cervical spine were also generated. COMPARISON:  12/19/2016 FINDINGS: CT HEAD FINDINGS Brain: No acute intracranial abnormality. Specifically, no hemorrhage, hydrocephalus, mass lesion, acute infarction, or significant intracranial injury. Vascular: No hyperdense vessel or unexpected calcification. Skull: No acute calvarial abnormality. Sinuses/Orbits: Visualized paranasal sinuses and mastoids clear. Orbital soft tissues unremarkable. Other: None CT CERVICAL SPINE FINDINGS Alignment: Normal Skull base and vertebrae: No acute fracture. No primary bone lesion or focal pathologic process. Soft tissues and spinal  canal: No prevertebral fluid or swelling. No visible canal hematoma. Disc levels:  Normal Upper chest: Normal Other: None IMPRESSION: No acute intracranial abnormality. No acute bony abnormality in the cervical spine. Electronically Signed   By: Charlett Nose M.D.   On: 03/28/2019 00:19   Ct Cervical Spine Wo Contrast  Result Date: 03/28/2019 CLINICAL DATA:  Headache.  Fall. EXAM: CT HEAD WITHOUT CONTRAST CT CERVICAL SPINE WITHOUT CONTRAST TECHNIQUE: Multidetector CT imaging of the head and cervical spine was performed following the standard protocol without intravenous contrast. Multiplanar CT image reconstructions of the cervical spine were also generated. COMPARISON:  12/19/2016 FINDINGS: CT HEAD FINDINGS Brain: No acute intracranial abnormality. Specifically, no hemorrhage, hydrocephalus, mass lesion, acute infarction, or significant intracranial injury. Vascular: No hyperdense vessel or unexpected calcification. Skull: No acute calvarial abnormality. Sinuses/Orbits: Visualized paranasal sinuses and mastoids clear. Orbital soft tissues unremarkable. Other: None CT CERVICAL SPINE FINDINGS Alignment: Normal Skull base and vertebrae: No acute fracture. No primary bone lesion or focal pathologic process. Soft tissues and spinal canal: No prevertebral fluid or swelling. No visible canal hematoma. Disc levels:  Normal Upper chest: Normal Other: None IMPRESSION: No acute intracranial abnormality. No acute bony abnormality in the cervical spine. Electronically Signed   By: Charlett Nose M.D.   On: 03/28/2019 00:19   Dg Chest Port 1 View  Result Date: 03/28/2019 CLINICAL DATA:  Cough EXAM: PORTABLE CHEST 1 VIEW COMPARISON:  05/24/2018 FINDINGS: No focal opacity or pleural effusion. Stable cardiomediastinal silhouette. Valve prostheses. No pneumothorax. IMPRESSION: No active disease. Electronically Signed   By: Jasmine Pang M.D.   On: 03/28/2019 00:40    Lab Data:  CBC: Recent Labs  Lab 03/28/19 0040  03/28/19 1003 03/29/19 0349 03/30/19 0402  WBC 6.1 8.7 7.4 8.5  NEUTROABS 3.5 6.2  --   --   HGB 12.2 12.1 10.4* 9.5*  HCT 35.6* 35.0* 31.0* 28.2*  MCV 109.5* 111.5* 110.3* 113.7*  PLT 415* 373 351 313   Basic Metabolic Panel: Recent Labs  Lab 03/28/19 0040 03/28/19 1003 03/29/19 0349 03/30/19 0402  NA 132* 134* 134* 134*  K 4.2 4.5 4.4 3.9  CL 100 100 104 104  CO2 22 23 23  21*  GLUCOSE 93 103* 80 84  BUN <5* 6 9 14   CREATININE 0.73 0.87 0.82 0.85  CALCIUM 8.4* 8.6* 8.4* 8.3*  MG  --   --   --  1.7   GFR: Estimated Creatinine Clearance: 58.5 mL/min (by C-G formula based on SCr of 0.85 mg/dL). Liver Function Tests: Recent Labs  Lab 03/28/19 0040 03/28/19 1003  AST 35 35  ALT 18 17  ALKPHOS 132* 139*  BILITOT 0.3 <0.1*  PROT 6.6 6.4*  ALBUMIN 3.0* 3.0*  No results for input(s): LIPASE, AMYLASE in the last 168 hours. No results for input(s): AMMONIA in the last 168 hours. Coagulation Profile: Recent Labs  Lab 03/28/19 0040 03/29/19 0349 03/30/19 0402  INR 3.5* 2.3* 1.4*   Cardiac Enzymes: No results for input(s): CKTOTAL, CKMB, CKMBINDEX, TROPONINI in the last 168 hours. BNP (last 3 results) No results for input(s): PROBNP in the last 8760 hours. HbA1C: No results for input(s): HGBA1C in the last 72 hours. CBG: No results for input(s): GLUCAP in the last 168 hours. Lipid Profile: No results for input(s): CHOL, HDL, LDLCALC, TRIG, CHOLHDL, LDLDIRECT in the last 72 hours. Thyroid Function Tests: Recent Labs    03/28/19 1003  TSH 2.481  FREET4 0.69   Anemia Panel: Recent Labs    03/29/19 0349  VITAMINB12 211  FOLATE 8.1  FERRITIN 160  TIBC 292  IRON 176*  RETICCTPCT 2.7   Urine analysis:    Component Value Date/Time   COLORURINE YELLOW 03/28/2019 0040   APPEARANCEUR CLEAR 03/28/2019 0040   LABSPEC 1.003 (L) 03/28/2019 0040   PHURINE 6.0 03/28/2019 0040   GLUCOSEU NEGATIVE 03/28/2019 0040   HGBUR LARGE (A) 03/28/2019 0040    BILIRUBINUR NEGATIVE 03/28/2019 0040   KETONESUR NEGATIVE 03/28/2019 0040   PROTEINUR NEGATIVE 03/28/2019 0040   UROBILINOGEN 0.2 06/04/2015 1533   NITRITE NEGATIVE 03/28/2019 0040   LEUKOCYTESUR NEGATIVE 03/28/2019 0040     Praneel Haisley M.D. [facility] 03/30/2019, 9:58 AM  Pager: [phone] Between 7am to 7pm - call Pager - [phone]  After 7pm go to [email or web address] - password [email or web address]  Call night coverage person covering after 7pm

## 2019-03-30 NOTE — Progress Notes (Signed)
Progress Note  Patient Name: Mary [name] Date of Encounter: 03/30/2019  Primary Cardiologist: Candee Furbish, MD   Subjective   The patient is feeling well, denies any dizziness, palpitations.   Inpatient Medications    Scheduled Meds: . aspirin EC  81 mg Oral Daily   Continuous Infusions: . heparin 800 Units/hr (03/30/19 0818)   PRN Meds: acetaminophen **OR** acetaminophen   Vital Signs    Vitals:   03/29/19 1131 03/29/19 2119 03/30/19 0435 03/30/19 0714  BP: 112/76 122/73 117/80   Pulse: (!) 104 100 (!) 102   Resp: (!) 25  18   Temp: 98.8 F (37.1 C) 98.3 F (36.8 C) 98.3 F (36.8 C)   TempSrc: Oral Oral Oral   SpO2: 100% 100% 100%   Weight:    54.7 kg  Height:        Intake/Output Summary (Last 24 hours) at 03/30/2019 0947 Last data filed at 03/29/2019 1751 Gross per 24 hour  Intake 320 ml  Output -  Net 320 ml   Last 3 Weights 03/30/2019 03/29/2019 03/28/2019  Weight (lbs) 120 lb 9.6 oz 120 lb 2.4 oz 121 lb 8 oz  Weight (kg) 54.704 kg 54.5 kg 55.112 kg      Telemetry    Sinus tach, lowest is about 100 bpm when sleeping but up near 130 bpm intermittently. Occasional PVCs. I did not see a significant episode of tachycardia that was not sinus- Personally Reviewed  ECG    Sinus tach at 113 - Personally Reviewed  Physical Exam   GEN: No acute distress.   Neck: No JVD Cardiac: RRR, no murmurs, rubs, or gallops. Crisp mechanical mitral valve Respiratory: Clear to auscultation bilaterally. GI: Soft, nontender, non-distended  MS: No edema; No deformity. Neuro:  Nonfocal  Psych: Normal affect   Labs    High Sensitivity Troponin:  No results for input(s): TROPONINIHS in the last 720 hours.    Cardiac EnzymesNo results for input(s): TROPONINI in the last 168 hours. No results for input(s): TROPIPOC in the last 168 hours.   Chemistry Recent Labs  Lab 03/28/19 0040 03/28/19 1003 03/29/19 0349 03/30/19 0402  NA 132* 134* 134* 134*  K 4.2 4.5 4.4 3.9   CL 100 100 104 104  CO2 22 23 23  21*  GLUCOSE 93 103* 80 84  BUN <5* 6 9 14   CREATININE 0.73 0.87 0.82 0.85  CALCIUM 8.4* 8.6* 8.4* 8.3*  PROT 6.6 6.4*  --   --   ALBUMIN 3.0* 3.0*  --   --   AST 35 35  --   --   ALT 18 17  --   --   ALKPHOS 132* 139*  --   --   BILITOT 0.3 <0.1*  --   --   GFRNONAA >60 >60 >60 >60  GFRAA >60 >60 >60 >60  ANIONGAP 10 11 7 9      Hematology Recent Labs  Lab 03/28/19 1003 03/29/19 0349 03/30/19 0402  WBC 8.7 7.4 8.5  RBC 3.14* 2.81*  2.81* 2.48*  HGB 12.1 10.4* 9.5*  HCT 35.0* 31.0* 28.2*  MCV 111.5* 110.3* 113.7*  MCH 38.5* 37.0* 38.3*  MCHC 34.6 33.5 33.7  RDW 15.6* 15.0 14.8  PLT 373 351 313    BNPNo results for input(s): BNP, PROBNP in the last 168 hours.   DDimer No results for input(s): DDIMER in the last 168 hours.   Radiology    No results found.  Cardiac Studies   Prior  reviewed, none this admission. Most recent echo 03/06/19 personally reviewed  Patient Profile     58 y.o. female with PMH mechanical mitral valve and s/p TV repair, chronic diastolic heart failure, transient complete heart block, hypertension, stroke. She is followed for an episode of syncope that occurred after palpitations.   Assessment & Plan    1. Recurrent syncope in the setting of known transient complete heart block and likely tachyarrhythmia: rhythm here has been sinus tach with occasional PVCs.  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 -Dr. Lovena Le to see her this weekend, anticipated PM placement on Monday, the patient is agreeable.  2. Mechanical mitral valve, s/p tricuspid valve repair, chronic diastolic heart failure -appears euvolemic, received some IV fluids on admission -recent echo 03/06/19 stable -INR today 1.3, goal 2.5-3.5 for mechanical valve, on iv Heparin for PM placement on Monday. -no  active bleeding, Hgb down today (12.1 -> 10.4), macrocytic anemia workup as per primary team  For questions or updates, please contact [facility] Please consult [email or web address] for contact info under     Signed, Ena Dawley, MD  03/30/2019, 9:47 AM

## 2019-03-30 NOTE — Progress Notes (Signed)
ANTICOAGULATION CONSULT NOTE - Follow Up Consult  Pharmacy Consult for Heparin Indication: mechanical MVR - bridge therapy while warfarin on hold  Allergies  Allergen Reactions  . Oxycodone Itching    Patient Measurements: Height: 5\' 2"  (157.5 cm) Weight: 120 lb 2.4 oz (54.5 kg) IBW/kg (Calculated) : 50.1 Heparin Dosing Weight:   Vital Signs: Temp: 98.3 F (36.8 C) (07/04 0435) Temp Source: Oral (07/04 0435) BP: 117/80 (07/04 0435) Pulse Rate: 102 (07/04 0435)  Labs: Recent Labs    03/28/19 0040 03/28/19 1003 03/29/19 0349 03/29/19 1404 03/30/19 0402  HGB 12.2 12.1 10.4*  --  9.5*  HCT 35.6* 35.0* 31.0*  --  28.2*  PLT 415* 373 351  --  313  LABPROT 34.6*  --  24.6*  --  16.7*  INR 3.5*  --  2.3*  --  1.4*  HEPARINUNFRC  --   --   --  0.53 0.62  CREATININE 0.73 0.87 0.82  --  0.85    Estimated Creatinine Clearance: 58.5 mL/min (by C-G formula based on SCr of 0.85 mg/dL).   Medications:  Infusions:  . heparin 800 Units/hr (03/29/19 0720)    Assessment: Patient with heparin level at goal.  No heparin issues noted.  Goal of Therapy:  Heparin level 0.3-0.7 units/ml Monitor platelets by anticoagulation protocol: Yes   Plan:  Continue heparin drip at current rate Recheck level with AM labs  Nani Skillern Crowford 03/30/2019,6:21 AM

## 2019-03-31 LAB — HEPARIN LEVEL (UNFRACTIONATED)
Heparin Unfractionated: 0.28 IU/mL — ABNORMAL LOW (ref 0.30–0.70)
Heparin Unfractionated: 0.71 IU/mL — ABNORMAL HIGH (ref 0.30–0.70)

## 2019-03-31 LAB — PROTIME-INR
INR: 1.3 — ABNORMAL HIGH (ref 0.8–1.2)
Prothrombin Time: 15.9 seconds — ABNORMAL HIGH (ref 11.4–15.2)

## 2019-03-31 LAB — CBC
HCT: 26.1 % — ABNORMAL LOW (ref 36.0–46.0)
Hemoglobin: 8.7 g/dL — ABNORMAL LOW (ref 12.0–15.0)
MCH: 37.8 pg — ABNORMAL HIGH (ref 26.0–34.0)
MCHC: 33.3 g/dL (ref 30.0–36.0)
MCV: 113.5 fL — ABNORMAL HIGH (ref 80.0–100.0)
Platelets: 304 10*3/uL (ref 150–400)
RBC: 2.3 MIL/uL — ABNORMAL LOW (ref 3.87–5.11)
RDW: 14.9 % (ref 11.5–15.5)
WBC: 7.7 10*3/uL (ref 4.0–10.5)
nRBC: 0 % (ref 0.0–0.2)

## 2019-03-31 MED ORDER — WARFARIN SODIUM 5 MG PO TABS
10.0000 mg | ORAL_TABLET | ORAL | Status: AC
  Administered 2019-03-31: 10 mg via ORAL
  Filled 2019-03-31: qty 2

## 2019-03-31 MED ORDER — HEPARIN (PORCINE) 25000 UT/250ML-% IV SOLN
900.0000 [IU]/h | INTRAVENOUS | Status: AC
  Administered 2019-03-31: 900 [IU]/h via INTRAVENOUS

## 2019-03-31 MED ORDER — WARFARIN - PHARMACIST DOSING INPATIENT
Freq: Every day | Status: DC
  Administered 2019-04-01 – 2019-04-03 (×3)

## 2019-03-31 NOTE — Progress Notes (Signed)
No AVB on telemetry overnight, she can be discharged, Coumadin has to be restarted.   Ena Dawley, MD

## 2019-03-31 NOTE — Progress Notes (Signed)
ANTICOAGULATION CONSULT NOTE - Follow Up Consult  Pharmacy Consult for heparin & coumadin Indication: mechanical MVR  Allergies  Allergen Reactions  . Oxycodone Itching    Patient Measurements: Height: 5\' 2"  (157.5 cm) Weight: 123 lb 11.2 oz (56.1 kg) IBW/kg (Calculated) : 50.1 .  Vital Signs: Temp: 98.5 F (36.9 C) (07/05 0520) Temp Source: Oral (07/05 0520) BP: 133/75 (07/05 0520) Pulse Rate: 100 (07/05 0520)  Labs: Recent Labs    03/29/19 0349 03/29/19 1404 03/30/19 0402 03/31/19 0423  HGB 10.4*  --  9.5* 8.7*  HCT 31.0*  --  28.2* 26.1*  PLT 351  --  313 304  LABPROT 24.6*  --  16.7* 15.9*  INR 2.3*  --  1.4* 1.3*  HEPARINUNFRC  --  0.53 0.62 0.28*  CREATININE 0.82  --  0.85  --     Estimated Creatinine Clearance: 58.5 mL/min (by C-G formula based on SCr of 0.85 mg/dL).  Assessment: 58 yo F on warfarin PTA for mechanical mitral valve.  Pharmacy was consulted to dose heparin for bridge therapy while warfarin on hold for possible pacemaker placement planned Monday 7/6.[name]  Home warfarin per coumadin clinic dosing: 7.5 mg MWF and 5 mg TTSS. Last dose 6/30 at 1800.  INR goal 2.5 - 3.6 Admission INR therapeutic at 3.5.  HG 12.1, PLTC 373.     . Goal of Therapy:  Heparin level 0.3-0.7 units/ml Monitor platelets by anticoagulation protocol: Yes  INR 2.5 - 3.5 for MVR - per coumadin clinic notes  Today, 03/31/2019: Plans for pacemaker were canceled and warfarin was resumed yesterday by Dr. Crissie Sickles Heparin level up to 0.71 after rate increased from 800 to 950 units/hr - top of goal range.  INR 1.3 after coumadin held x 2 days.  Pharmacy consulted to dose coumadin for MVR.  Pt was a difficult stick for labs. No bleeding reported.  Plan:  Coumadin 10 mg po x 1 dose today - give early, do not wait until 1800 Decrease heparin drip to 900 units/hr Daily INR, heparin level, CBC To continue heparin bridge until INR > 2.5 per TRH note  Eudelia Bunch,  Pharm.D 03/31/2019 3:53 PM

## 2019-03-31 NOTE — Progress Notes (Signed)
ANTICOAGULATION CONSULT NOTE - Follow Up Consult  Pharmacy Consult for heparin  Indication: mechanical MVR - bridge therapy while warfarin on hold  Allergies  Allergen Reactions  . Oxycodone Itching    Patient Measurements: Height: 5\' 2"  (157.5 cm) Weight: 120 lb 9.6 oz (54.7 kg) IBW/kg (Calculated) : 50.1 .  Vital Signs: Temp: 98.5 F (36.9 C) (07/05 0520) Temp Source: Oral (07/05 0520) BP: 133/75 (07/05 0520) Pulse Rate: 100 (07/05 0520)  Labs: Recent Labs    03/28/19 1003 03/29/19 0349 03/29/19 1404 03/30/19 0402 03/31/19 0423  HGB 12.1 10.4*  --  9.5* 8.7*  HCT 35.0* 31.0*  --  28.2* 26.1*  PLT 373 351  --  313 304  LABPROT  --  24.6*  --  16.7* 15.9*  INR  --  2.3*  --  1.4* 1.3*  HEPARINUNFRC  --   --  0.53 0.62 0.28*  CREATININE 0.87 0.82  --  0.85  --     Estimated Creatinine Clearance: 58.5 mL/min (by C-G formula based on SCr of 0.85 mg/dL).   Medications:  Medications Prior to Admission  Medication Sig Dispense Refill Last Dose  . aspirin EC 81 MG tablet Take 1 tablet (81 mg total) by mouth daily.   03/27/2019 at 930a  . fluticasone (FLONASE) 50 MCG/ACT nasal spray Place 1 spray into both nostrils daily as needed for allergies or rhinitis.   03/26/2019 at Unknown time  . Hyprom-Naphaz-Polysorb-Zn Sulf (CLEAR EYES COMPLETE OP) Place 2 drops into both eyes daily as needed (red eyes).   Past Month at Unknown time  . saccharomyces boulardii (FLORASTOR) 250 MG capsule Take 1 capsule (250 mg total) by mouth 2 (two) times daily. 60 capsule 0 Past Month at Unknown time  . warfarin (COUMADIN) 5 MG tablet Take as directed by coumadin clinic (Patient taking differently: Take 5-7.5 mg by mouth daily. M,W,and f 7.5 mg tablet. Tue, Thursday and Saturday and Sunday 5 mg) 50 tablet 0 03/26/2019 at 6pm    Assessment: 58 yo F on warfarin PTA for mechanical mitral valve.  Pharmacy was consulted to dose heparin for bridge therapy while warfarin on hold for possible pacemaker  placement planned Monday 7/6.[name]  Home warfarin per coumadin clinic dosing: 7.5 mg MWF and 5 mg TTSS. Last dose 6/30 at 1800.  INR goal 2.5 - 3.6 Admission INR therapeutic at 3.5.  HG 12.1, PLTC 373.     . Goal of Therapy:  Heparin level 0.3-0.7 units/ml Monitor platelets by anticoagulation protocol: Yes  Today, 03/31/2019: Plans for pacemaker were canceled and warfarin was resumed yesterday by cardiology - see progress notes. Heparin level has fallen, now subtherapeutic (0.28).  RN confirms infusion at 800 units/hr with no interruptions last night. Hgb steadily falling (12.1 on admission 7/2, now 8.7).  RN reports no bleeding noted. Pltc WNL INR low - warfarin was resumed just yesterday.   Plan:  Increase heparin to 950 units/hr Recheck heparin level in 6 hrs Cardiology currently managing inpatient warfarin  Clayburn Pert, PharmD, BCPS [phone] 03/31/2019  6:10 AM   .

## 2019-03-31 NOTE — Progress Notes (Signed)
[facility]                                                                              Patient Demographics  Mary [name], is a 58 y.o. female, DOB - 11-07-1960, UJW:[id number]  Admit date - 03/27/2019   Admitting Physician Eduard Clos, MD  Outpatient Primary MD for the patient is Cain Saupe, MD  Outpatient specialists:   LOS - 3  days   Medical records reviewed and are as summarized below:    Chief Complaint  Patient presents with   Headache       Brief summary   Patient is a 58 year old female with history of mechanical mitral valve, SVT, transient complete heart block, diastolic dysfunction, tricuspid valve repair was admitted with syncopal episode. In ED, tachycardia with heart rate in low 100s, mild hyponatremia 132.  Patient was admitted for syncope.  Cardiology was consulted. COVID-19 test negative  Assessment & Plan    Principal Problem: Recurrent syncope in the setting of extensive cardiac history and arrhythmia given history of SVT, transient heart block. 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  Recommended loop recorder procedure outpatient in the office, however if she develops symptomatic AV block or sinus node dysfunction, bradycardia, then will place pacemaker -Restarted Coumadin, continue IV heparin bridge until INR above 2.5.  Patient declined Lovenox.   Active Problems:   H/O mitral valve replacement with mechanical valve, S/P tricuspid valve repair -Continue IV heparin drip, Coumadin on hold.  INR 1.4. -Recent 2D echo in 02/2019 showed EF > 65%  Chronic diastolic CHF -Currently euvolemic, compensated, stable -Follow I's and O's closely  Code Status: Full CODE  STATUS DVT Prophylaxis: Heparin Family Communication: Discussed in detail with the patient, all imaging results, lab results explained to the patient    Disposition Plan: DC home once INR above 2.5, continue IV heparin drip  Time Spent in minutes   25 minutes  Procedures:  None  Consultants:   EP cardiology, Dr. Ladona Ridgel Cardiology, Dr. Delton See  Antimicrobials:   Anti-infectives (From admission, onward)   None         Medications  Scheduled Meds:  aspirin EC  81 mg Oral Daily   Warfarin - Physician Dosing Inpatient   Does not apply q1800   Continuous Infusions:  heparin 950 Units/hr (03/31/19 0637)   PRN Meds:.acetaminophen **OR** acetaminophen      Subjective:   Mary [name] was seen and examined today.  Ambulating in the room, no bradycardia episode, mostly has been tachycardiac during this hospitalization.  Patient denies dizziness, chest pain, shortness of breath, abdominal pain, N/V/D/C, new weakness, numbess, tingling. No acute events overnight.    Objective:   Vitals:   03/30/19 0714 03/30/19 1420 03/30/19 2134 03/31/19 0520  BP:  112/79 (!) 123/95 133/75  Pulse:  (!) 104 97 100  Resp:   14 17  Temp:  98.3 F (36.8 C) 98.1 F (36.7 C) 98.5  F (36.9 C)  TempSrc:  Oral Oral Oral  SpO2:  100% 100% 100%  Weight: 54.7 kg   56.1 kg  Height:        Intake/Output Summary (Last 24 hours) at 03/31/2019 1115 Last data filed at 03/31/2019 0960 Gross per 24 hour  Intake 480 ml  Output --  Net 480 ml     Wt Readings from Last 3 Encounters:  03/31/19 56.1 kg  02/12/19 56.8 kg  12/05/18 58.1 kg   Physical Exam  General: Alert and oriented x 3, NAD  Eyes:   HEENT:  Atraumatic, normocephalic  Cardiovascular: S1 S2 clear, mechanical mitral valve click No pedal edema b/l  Respiratory: CTAB, no wheezing, rales or rhonchi  Gastrointestinal: Soft, nontender, nondistended, NBS  Ext: no pedal edema bilaterally  Neuro: no new  deficits  Musculoskeletal: No cyanosis, clubbing  Skin: No rashes  Psych: Normal affect and demeanor, alert and oriented x3      Data Reviewed:  I have personally reviewed following labs and imaging studies  Micro Results Recent Results (from the past 240 hour(s))  SARS Coronavirus 2 (CEPHEID - Performed in [facility] hospital lab), Hosp Order     Status: None   Collection Time: 03/28/19  4:41 AM   Specimen: Nasopharyngeal Swab  Result Value Ref Range Status   SARS Coronavirus 2 NEGATIVE NEGATIVE Final    Comment: (NOTE) If result is NEGATIVE SARS-CoV-2 target nucleic acids are NOT DETECTED. The SARS-CoV-2 RNA is generally detectable in upper and lower  respiratory specimens during the acute phase of infection. The lowest  concentration of SARS-CoV-2 viral copies this assay can detect is 250  copies / mL. A negative result does not preclude SARS-CoV-2 infection  and should not be used as the sole basis for treatment or other  patient management decisions.  A negative result may occur with  improper specimen collection / handling, submission of specimen other  than nasopharyngeal swab, presence of viral mutation(s) within the  areas targeted by this assay, and inadequate number of viral copies  (<250 copies / mL). A negative result must be combined with clinical  observations, patient history, and epidemiological information. If result is POSITIVE SARS-CoV-2 target nucleic acids are DETECTED. The SARS-CoV-2 RNA is generally detectable in upper and lower  respiratory specimens dur ing the acute phase of infection.  Positive  results are indicative of active infection with SARS-CoV-2.  Clinical  correlation with patient history and other diagnostic information is  necessary to determine patient infection status.  Positive results do  not rule out bacterial infection or co-infection with other viruses. If result is PRESUMPTIVE POSTIVE SARS-CoV-2 nucleic acids MAY BE PRESENT.    A presumptive positive result was obtained on the submitted specimen  and confirmed on repeat testing.  While 2019 novel coronavirus  (SARS-CoV-2) nucleic acids may be present in the submitted sample  additional confirmatory testing may be necessary for epidemiological  and / or clinical management purposes  to differentiate between  SARS-CoV-2 and other Sarbecovirus currently known to infect humans.  If clinically indicated additional testing with an alternate test  methodology [phone]) is advised. The SARS-CoV-2 RNA is generally  detectable in upper and lower respiratory sp ecimens during the acute  phase of infection. The expected result is Negative. Fact Sheet for Patients:  BoilerBrush.com.cy Fact Sheet for Healthcare Providers: https://pope.com/ This test is not yet approved or cleared by the [place] FDA and has been authorized for detection and/or diagnosis of SARS-CoV-2  by FDA under an Emergency Use Authorization (EUA).  This EUA will remain in effect (meaning this test can be used) for the duration of the COVID-19 declaration under Section 564(b)(1) of the Act, 21 U.S.C. section 360bbb-3(b)(1), unless the authorization is terminated or revoked sooner. Performed at [facility], [address] [place], [place] [address]     Radiology Reports Ct Head Wo Contrast  Result Date: 03/28/2019 CLINICAL DATA:  Headache.  Fall. EXAM: CT HEAD WITHOUT CONTRAST CT CERVICAL SPINE WITHOUT CONTRAST TECHNIQUE: Multidetector CT imaging of the head and cervical spine was performed following the standard protocol without intravenous contrast. Multiplanar CT image reconstructions of the cervical spine were also generated. COMPARISON:  12/19/2016 FINDINGS: CT HEAD FINDINGS Brain: No acute intracranial abnormality. Specifically, no hemorrhage, hydrocephalus, mass lesion, acute infarction, or significant intracranial injury.  Vascular: No hyperdense vessel or unexpected calcification. Skull: No acute calvarial abnormality. Sinuses/Orbits: Visualized paranasal sinuses and mastoids clear. Orbital soft tissues unremarkable. Other: None CT CERVICAL SPINE FINDINGS Alignment: Normal Skull base and vertebrae: No acute fracture. No primary bone lesion or focal pathologic process. Soft tissues and spinal canal: No prevertebral fluid or swelling. No visible canal hematoma. Disc levels:  Normal Upper chest: Normal Other: None IMPRESSION: No acute intracranial abnormality. No acute bony abnormality in the cervical spine. Electronically Signed   By: Charlett Nose M.D.   On: 03/28/2019 00:19   Ct Cervical Spine Wo Contrast  Result Date: 03/28/2019 CLINICAL DATA:  Headache.  Fall. EXAM: CT HEAD WITHOUT CONTRAST CT CERVICAL SPINE WITHOUT CONTRAST TECHNIQUE: Multidetector CT imaging of the head and cervical spine was performed following the standard protocol without intravenous contrast. Multiplanar CT image reconstructions of the cervical spine were also generated. COMPARISON:  12/19/2016 FINDINGS: CT HEAD FINDINGS Brain: No acute intracranial abnormality. Specifically, no hemorrhage, hydrocephalus, mass lesion, acute infarction, or significant intracranial injury. Vascular: No hyperdense vessel or unexpected calcification. Skull: No acute calvarial abnormality. Sinuses/Orbits: Visualized paranasal sinuses and mastoids clear. Orbital soft tissues unremarkable. Other: None CT CERVICAL SPINE FINDINGS Alignment: Normal Skull base and vertebrae: No acute fracture. No primary bone lesion or focal pathologic process. Soft tissues and spinal canal: No prevertebral fluid or swelling. No visible canal hematoma. Disc levels:  Normal Upper chest: Normal Other: None IMPRESSION: No acute intracranial abnormality. No acute bony abnormality in the cervical spine. Electronically Signed   By: Charlett Nose M.D.   On: 03/28/2019 00:19   Dg Chest Port 1 View  Result  Date: 03/28/2019 CLINICAL DATA:  Cough EXAM: PORTABLE CHEST 1 VIEW COMPARISON:  05/24/2018 FINDINGS: No focal opacity or pleural effusion. Stable cardiomediastinal silhouette. Valve prostheses. No pneumothorax. IMPRESSION: No active disease. Electronically Signed   By: Jasmine Pang M.D.   On: 03/28/2019 00:40    Lab Data:  CBC: Recent Labs  Lab 03/28/19 0040 03/28/19 1003 03/29/19 0349 03/30/19 0402 03/31/19 0423  WBC 6.1 8.7 7.4 8.5 7.7  NEUTROABS 3.5 6.2  --   --   --   HGB 12.2 12.1 10.4* 9.5* 8.7*  HCT 35.6* 35.0* 31.0* 28.2* 26.1*  MCV 109.5* 111.5* 110.3* 113.7* 113.5*  PLT 415* 373 351 313 304   Basic Metabolic Panel: Recent Labs  Lab 03/28/19 0040 03/28/19 1003 03/29/19 0349 03/30/19 0402  NA 132* 134* 134* 134*  K 4.2 4.5 4.4 3.9  CL 100 100 104 104  CO2 22 23 23  21*  GLUCOSE 93 103* 80 84  BUN <5* 6 9 14   CREATININE 0.73 0.87 0.82  0.85  CALCIUM 8.4* 8.6* 8.4* 8.3*  MG  --   --   --  1.7   GFR: Estimated Creatinine Clearance: 58.5 mL/min (by C-G formula based on SCr of 0.85 mg/dL). Liver Function Tests: Recent Labs  Lab 03/28/19 0040 03/28/19 1003  AST 35 35  ALT 18 17  ALKPHOS 132* 139*  BILITOT 0.3 <0.1*  PROT 6.6 6.4*  ALBUMIN 3.0* 3.0*   No results for input(s): LIPASE, AMYLASE in the last 168 hours. No results for input(s): AMMONIA in the last 168 hours. Coagulation Profile: Recent Labs  Lab 03/28/19 0040 03/29/19 0349 03/30/19 0402 03/31/19 0423  INR 3.5* 2.3* 1.4* 1.3*   Cardiac Enzymes: No results for input(s): CKTOTAL, CKMB, CKMBINDEX, TROPONINI in the last 168 hours. BNP (last 3 results) No results for input(s): PROBNP in the last 8760 hours. HbA1C: No results for input(s): HGBA1C in the last 72 hours. CBG: No results for input(s): GLUCAP in the last 168 hours. Lipid Profile: No results for input(s): CHOL, HDL, LDLCALC, TRIG, CHOLHDL, LDLDIRECT in the last 72 hours. Thyroid Function Tests: No results for input(s): TSH,  T4TOTAL, FREET4, T3FREE, THYROIDAB in the last 72 hours. Anemia Panel: Recent Labs    03/29/19 0349  VITAMINB12 211  FOLATE 8.1  FERRITIN 160  TIBC 292  IRON 176*  RETICCTPCT 2.7   Urine analysis:    Component Value Date/Time   COLORURINE YELLOW 03/28/2019 0040   APPEARANCEUR CLEAR 03/28/2019 0040   LABSPEC 1.003 (L) 03/28/2019 0040   PHURINE 6.0 03/28/2019 0040   GLUCOSEU NEGATIVE 03/28/2019 0040   HGBUR LARGE (A) 03/28/2019 0040   BILIRUBINUR NEGATIVE 03/28/2019 0040   KETONESUR NEGATIVE 03/28/2019 0040   PROTEINUR NEGATIVE 03/28/2019 0040   UROBILINOGEN 0.2 06/04/2015 1533   NITRITE NEGATIVE 03/28/2019 0040   LEUKOCYTESUR NEGATIVE 03/28/2019 0040     Almarie Kurdziel M.D. [facility] 03/31/2019, 11:15 AM  Pager: [phone] Between 7am to 7pm - call Pager - [phone]  After 7pm go to [email or web address] - password [email or web address]  Call night coverage person covering after 7pm

## 2019-04-01 LAB — CBC
HCT: 25.7 % — ABNORMAL LOW (ref 36.0–46.0)
Hemoglobin: 8.3 g/dL — ABNORMAL LOW (ref 12.0–15.0)
MCH: 37.6 pg — ABNORMAL HIGH (ref 26.0–34.0)
MCHC: 32.3 g/dL (ref 30.0–36.0)
MCV: 116.3 fL — ABNORMAL HIGH (ref 80.0–100.0)
Platelets: 290 10*3/uL (ref 150–400)
RBC: 2.21 MIL/uL — ABNORMAL LOW (ref 3.87–5.11)
RDW: 15.3 % (ref 11.5–15.5)
WBC: 6.9 10*3/uL (ref 4.0–10.5)
nRBC: 0 % (ref 0.0–0.2)

## 2019-04-01 LAB — RETICULOCYTES
Immature Retic Fract: 11.9 % (ref 2.3–15.9)
RBC.: 2.67 MIL/uL — ABNORMAL LOW (ref 3.87–5.11)
Retic Count, Absolute: 67.3 10*3/uL (ref 19.0–186.0)
Retic Ct Pct: 2.5 % (ref 0.4–3.1)

## 2019-04-01 LAB — FOLATE: Folate: 9.3 ng/mL (ref >5.9)

## 2019-04-01 LAB — IRON AND TIBC
Iron: 84 ug/dL (ref 28–170)
Saturation Ratios: 31 % (ref 10.4–31.8)
TIBC: 267 ug/dL (ref 250–450)
UIBC: 183 ug/dL

## 2019-04-01 LAB — FERRITIN: Ferritin: 183 ng/mL (ref 11–307)

## 2019-04-01 LAB — HEPARIN LEVEL (UNFRACTIONATED)
Heparin Unfractionated: 0.57 IU/mL (ref 0.30–0.70)
Heparin Unfractionated: 0.68 IU/mL (ref 0.30–0.70)

## 2019-04-01 LAB — VITAMIN B12: Vitamin B-12: 176 pg/mL — ABNORMAL LOW (ref 180–914)

## 2019-04-01 LAB — PROTIME-INR
INR: 1.3 — ABNORMAL HIGH (ref 0.8–1.2)
Prothrombin Time: 16.2 seconds — ABNORMAL HIGH (ref 11.4–15.2)

## 2019-04-01 MED ORDER — ENOXAPARIN SODIUM 60 MG/0.6ML ~~LOC~~ SOLN
60.0000 mg | Freq: Two times a day (BID) | SUBCUTANEOUS | Status: DC
  Administered 2019-04-02 – 2019-04-04 (×5): 60 mg via SUBCUTANEOUS
  Filled 2019-04-01 (×5): qty 0.6

## 2019-04-01 MED ORDER — WARFARIN SODIUM 5 MG PO TABS
10.0000 mg | ORAL_TABLET | Freq: Once | ORAL | Status: AC
  Administered 2019-04-01: 10 mg via ORAL
  Filled 2019-04-01: qty 2

## 2019-04-01 MED ORDER — ENOXAPARIN SODIUM 60 MG/0.6ML ~~LOC~~ SOLN
60.0000 mg | Freq: Once | SUBCUTANEOUS | Status: AC
  Administered 2019-04-01: 60 mg via SUBCUTANEOUS
  Filled 2019-04-01: qty 0.6

## 2019-04-01 MED ORDER — VITAMIN B-12 1000 MCG PO TABS
1000.0000 ug | ORAL_TABLET | Freq: Every day | ORAL | Status: DC
  Administered 2019-04-02 – 2019-04-04 (×3): 1000 ug via ORAL
  Filled 2019-04-01 (×3): qty 1

## 2019-04-01 NOTE — Progress Notes (Signed)
Progress Note  Patient Name: Mary [name] Date of Encounter: 04/01/2019  Primary Cardiologist: Candee Furbish, MD   Subjective   No chest discomfort, shortness of breath or lightheadedness/near syncope. She does feel like her heart is racing and says that this is a constant thing for her. She is very frustrated that her INR is low and her IV needs to be restarted for heparin and she will not be able to go home yet.   Inpatient Medications    Scheduled Meds: . aspirin EC  81 mg Oral Daily  . Warfarin - Pharmacist Dosing Inpatient   Does not apply q1800   Continuous Infusions: . heparin 900 Units/hr (03/31/19 1609)   PRN Meds: acetaminophen **OR** acetaminophen   Vital Signs    Vitals:   03/31/19 1354 03/31/19 2118 04/01/19 0418 04/01/19 0420  BP: 116/74 120/69 130/76   Pulse: 99 96 94   Resp:  19 (!) 21   Temp: 99.4 F (37.4 C) 98.5 F (36.9 C) 98.6 F (37 C)   TempSrc: Oral     SpO2: 100% 100% 100%   Weight:    58.8 kg  Height:        Intake/Output Summary (Last 24 hours) at 04/01/2019 0820 Last data filed at 04/01/2019 0603 Gross per 24 hour  Intake 727.77 ml  Output -  Net 727.77 ml   Last 3 Weights 04/01/2019 03/31/2019 03/30/2019  Weight (lbs) 129 lb 10.1 oz 123 lb 11.2 oz 120 lb 9.6 oz  Weight (kg) 58.8 kg 56.11 kg 54.704 kg      Telemetry    Sinus rhythm in the 80's overnight, sinus tach in the 110's-120's this am - Personally Reviewed  ECG    No new tracings- Personally Reviewed  Physical Exam   GEN: No acute distress.   Neck: No JVD Cardiac: Regular rhythm, tachycardia, no murmurs, rubs, or gallops. Mechanical valve click heard Respiratory: Clear to auscultation bilaterally. GI: Soft, nontender, non-distended  MS: No edema; No deformity. Neuro:  Nonfocal  Psych: Normal affect   Labs    High Sensitivity Troponin:  No results for input(s): TROPONINIHS in the last 720 hours.    Cardiac EnzymesNo results for input(s): TROPONINI in the last 168  hours. No results for input(s): TROPIPOC in the last 168 hours.   Chemistry Recent Labs  Lab 03/28/19 0040 03/28/19 1003 03/29/19 0349 03/30/19 0402  NA 132* 134* 134* 134*  K 4.2 4.5 4.4 3.9  CL 100 100 104 104  CO2 22 23 23  21*  GLUCOSE 93 103* 80 84  BUN <5* 6 9 14   CREATININE 0.73 0.87 0.82 0.85  CALCIUM 8.4* 8.6* 8.4* 8.3*  PROT 6.6 6.4*  --   --   ALBUMIN 3.0* 3.0*  --   --   AST 35 35  --   --   ALT 18 17  --   --   ALKPHOS 132* 139*  --   --   BILITOT 0.3 <0.1*  --   --   GFRNONAA >60 >60 >60 >60  GFRAA >60 >60 >60 >60  ANIONGAP 10 11 7 9      Hematology Recent Labs  Lab 03/30/19 0402 03/31/19 0423 04/01/19 0433  WBC 8.5 7.7 6.9  RBC 2.48* 2.30* 2.21*  HGB 9.5* 8.7* 8.3*  HCT 28.2* 26.1* 25.7*  MCV 113.7* 113.5* 116.3*  MCH 38.3* 37.8* 37.6*  MCHC 33.7 33.3 32.3  RDW 14.8 14.9 15.3  PLT 313 304 290  BNPNo results for input(s): BNP, PROBNP in the last 168 hours.   DDimer No results for input(s): DDIMER in the last 168 hours.   Radiology    No results found.  Cardiac Studies   Echocardiogram 03/06/2019 IMPRESSIONS   1. The left ventricle has hyperdynamic systolic function, with an ejection fraction of >65%. The cavity size was normal. Left ventricular diastolic function could not be evaluated due to nondiagnostic images. No evidence of left ventricular regional  wall motion abnormalities.  2. The right ventricle has normal systolic function. The cavity was normal. There is no increase in right ventricular wall thickness.  3. S/P 41mm [employer] mechanical MVR of the mitral valve leaflet. Doppler interrogation of the MVR was not performed. Cannot assess MR due to valve shadowing from prosthesis.  4. S/P TV repair with annuloplasty ring wiht trivial TR  5. The aortic valve is tricuspid. Mild sclerosis of the aortic valve. Aortic valve regurgitation was not assessed by color flow Doppler.  Holter Monitor 03/2018 Study Highlights   Normal  sinus rhythm and sinus tachycardia with average heart rate 109 bpm. Heart rate ranged from 27 to 171 bpm.  Intermittent complete heart block with slow ventricular response at 47 bpm during the daytime.  SVT at 171 bpm  Occasional PVCs and wide-complex tachycardia up to 5 beats       Patient Profile     58 y.o. female with PMH mechanical mitral valve and s/p TV repair, chronic diastolic heart failure, transient complete heart block, hypertension, stroke. She is followed for an episode of syncope that occurred after palpitations.   Assessment & Plan    1. Recurrent syncope -Pt with hx of known transient complete heart block and likely tachyarrhythmia, declined pacemaker in the past.  -Patient seen by Dr. Lovena Le for EP evaluation.  Patient was offered placement of permanent pacemaker empirically or placement of implanted loop recorder and she prefers placement of loop recorder to be certain that a permanent pacemaker would actually benefit her. -Plan was to discharge patient home and return to the office for loop recorder. She is being monitored on tele while here. Can have her go from hospital to office (or [facility] depending on location of Dr. Lovena Le) once discharged for placement of ILR. Call EP APP to arrange once discharge known.   2. Mechanical mitral valve and tricuspid valve repair -Stable on recent echo 03/06/2019 -INR goal 2.5-3.5 for mechanical valve.  Coumadin was held and patient bridged with heparin.  Coumadin has now been restarted.  INR is 1.3 today.  Plan for patient discharge once INR greater than 2.5.  Pharmacy is managing the dosing. -PT may possibly discharge with lovenox bridge, however, with mechanical mitral valve, may want to get her INR up to at least closer to 2.   3.  Anemia -Hemoglobin has decreased from 12.2 on admission to 8.3 today. Will defer to       For questions or updates, please contact [place] Please consult [email or web address] for contact info under         Signed, Daune Perch, NP  04/01/2019, 8:20 AM    History and all data above reviewed.  Patient examined.  I agree with the findings as above.  The patient has had no presyncope or syncope.  No pain.  No SOB.  The patient exam reveals COR:RRR  ,  Lungs: Clear  ,  Abd: Positive bowel sounds, no rebound no guarding, Ext No edema  .  All available labs,  radiology testing, previous records reviewed. Agree with documented assessment and plan. MVR:  She does not want to try Lovenox at home although we can continue to talk to her about this.  Continue heparin.  Plans for loop as above.   Jeneen Rinks [facility]  10:52 AM  04/01/2019

## 2019-04-01 NOTE — Progress Notes (Signed)
[facility]                                                                              Patient Demographics  Mary [name], is a 58 y.o. female, DOB - 14-Dec-1960, YSA:[id number]  Admit date - 03/27/2019   Admitting Physician Eduard Clos, MD  Outpatient Primary MD for the patient is Cain Saupe, MD  Outpatient specialists:   LOS - 4  days   Medical records reviewed and are as summarized below:    Chief Complaint  Patient presents with   Headache       Brief summary   Patient is a 58 year old female with history of mechanical mitral valve, SVT, transient complete heart block, diastolic dysfunction, tricuspid valve repair was admitted with syncopal episode. In ED, tachycardia with heart rate in low 100s, mild hyponatremia 132.  Patient was admitted for syncope.  Cardiology was consulted. COVID-19 test negative  Assessment & Plan    Principal Problem: Recurrent syncope in the setting of extensive cardiac history and arrhythmia given history of SVT, transient heart block. 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 Ladona Ridgel, currently no documented bradycardia episode during hospitalization, hence no indication of pacemaker but recommended loop recorder.  Recommended loop recorder procedure outpatient in the office, however if she develops symptomatic AV block or sinus node dysfunction, bradycardia, then will place pacemaker -Restarted Coumadin, INR still subtherapeutic 1.3.  Continue IV heparin bridge until INR above 2.5.  Patient is thinking about Lovenox.   Active Problems:   H/O mitral valve replacement with mechanical valve, S/P tricuspid valve repair -Continue IV heparin drip and Coumadin. -Recent 2D echo in 02/2019 showed EF > 65%  Chronic diastolic CHF -Currently euvolemic, compensated, stable  Anemia, macrocytic,  asymptomatic Obtain anemia panel, FOBT Hemoglobin 8.3, no indication for transfusion.  No hematemesis, melena or hematochezia.   Code Status: Full CODE STATUS DVT Prophylaxis: Heparin Family Communication: Discussed in detail with the patient, all imaging results, lab results explained to the patient    Disposition Plan: DC home once INR above 2.5, continue IV heparin drip  Time Spent in minutes   25 minutes  Procedures:  None  Consultants:   EP cardiology, Dr. Ladona Ridgel Cardiology, Dr. Delton See  Antimicrobials:   Anti-infectives (From admission, onward)   None         Medications  Scheduled Meds:  aspirin EC  81 mg Oral Daily   Warfarin - Pharmacist Dosing Inpatient   Does not apply q1800   Continuous Infusions:  heparin 900 Units/hr (03/31/19 1609)   PRN Meds:.acetaminophen **OR** acetaminophen      Subjective:   Mary [name] was seen and examined today.  Ambulating, somewhat frustrated over subtherapeutic INR.  No nausea, vomiting hematemesis, hematochezia or melena.  No dizziness.  Ambulating in the hallway without any difficulty.   Patient denies dizziness, chest pain, shortness of breath, abdominal pain, N/V/D/C, new weakness, numbess, tingling. No acute events overnight.    Objective:   Vitals:   03/31/19 1354 03/31/19 2118 04/01/19 3235 04/01/19 5732  BP: 116/74 120/69 130/76   Pulse: 99 96 94   Resp:  19 (!) 21   Temp: 99.4 F (37.4 C) 98.5 F (36.9 C) 98.6 F (37 C)   TempSrc: Oral     SpO2: 100% 100% 100%   Weight:    58.8 kg  Height:        Intake/Output Summary (Last 24 hours) at 04/01/2019 1314 Last data filed at 04/01/2019 4098 Gross per 24 hour  Intake 847.77 ml  Output --  Net 847.77 ml     Wt Readings from Last 3 Encounters:  04/01/19 58.8 kg  02/12/19 56.8 kg  12/05/18 58.1 kg    Physical Exam  General: Alert and oriented x 3, NAD  Eyes:   HEENT:   Cardiovascular: S1 S2 clear, mechanical mitral valve click, No  pedal edema b/l  Respiratory: CTAB, no wheezing, rales or rhonchi  Gastrointestinal: Soft, nontender, nondistended, NBS  Ext: no pedal edema bilaterally  Neuro: no new deficits  Musculoskeletal: No cyanosis, clubbing  Skin: No rashes  Psych: Normal affect and demeanor, alert and oriented x3       Data Reviewed:  I have personally reviewed following labs and imaging studies  Micro Results Recent Results (from the past 240 hour(s))  SARS Coronavirus 2 (CEPHEID - Performed in [facility] hospital lab), Hosp Order     Status: None   Collection Time: 03/28/19  4:41 AM   Specimen: Nasopharyngeal Swab  Result Value Ref Range Status   SARS Coronavirus 2 NEGATIVE NEGATIVE Final    Comment: (NOTE) If result is NEGATIVE SARS-CoV-2 target nucleic acids are NOT DETECTED. The SARS-CoV-2 RNA is generally detectable in upper and lower  respiratory specimens during the acute phase of infection. The lowest  concentration of SARS-CoV-2 viral copies this assay can detect is 250  copies / mL. A negative result does not preclude SARS-CoV-2 infection  and should not be used as the sole basis for treatment or other  patient management decisions.  A negative result may occur with  improper specimen collection / handling, submission of specimen other  than nasopharyngeal swab, presence of viral mutation(s) within the  areas targeted by this assay, and inadequate number of viral copies  (<250 copies / mL). A negative result must be combined with clinical  observations, patient history, and epidemiological information. If result is POSITIVE SARS-CoV-2 target nucleic acids are DETECTED. The SARS-CoV-2 RNA is generally detectable in upper and lower  respiratory specimens dur ing the acute phase of infection.  Positive  results are indicative of active infection with SARS-CoV-2.  Clinical  correlation with patient history and other diagnostic information is  necessary to determine patient  infection status.  Positive results do  not rule out bacterial infection or co-infection with other viruses. If result is PRESUMPTIVE POSTIVE SARS-CoV-2 nucleic acids MAY BE PRESENT.   A presumptive positive result was obtained on the submitted specimen  and confirmed on repeat testing.  While 2019 novel coronavirus  (SARS-CoV-2) nucleic acids may be present in the submitted sample  additional confirmatory testing may be necessary for epidemiological  and / or clinical management purposes  to differentiate between  SARS-CoV-2 and other Sarbecovirus currently known to infect humans.  If clinically indicated additional testing with an alternate test  methodology [phone]) is advised. The SARS-CoV-2 RNA is generally  detectable in upper and lower respiratory sp ecimens during the acute  phase of infection. The expected result is Negative. Fact Sheet for Patients:  BoilerBrush.com.cy  Fact Sheet for Healthcare Providers: https://pope.com/ This test is not yet approved or cleared by the [place] FDA and has been authorized for detection and/or diagnosis of SARS-CoV-2 by FDA under an Emergency Use Authorization (EUA).  This EUA will remain in effect (meaning this test can be used) for the duration of the COVID-19 declaration under Section 564(b)(1) of the Act, 21 U.S.C. section 360bbb-3(b)(1), unless the authorization is terminated or revoked sooner. Performed at [facility], [address], [place], [place] [address]     Radiology Reports Ct Head Wo Contrast  Result Date: 03/28/2019 CLINICAL DATA:  Headache.  Fall. EXAM: CT HEAD WITHOUT CONTRAST CT CERVICAL SPINE WITHOUT CONTRAST TECHNIQUE: Multidetector CT imaging of the head and cervical spine was performed following the standard protocol without intravenous contrast. Multiplanar CT image reconstructions of the cervical spine were also generated. COMPARISON:   12/19/2016 FINDINGS: CT HEAD FINDINGS Brain: No acute intracranial abnormality. Specifically, no hemorrhage, hydrocephalus, mass lesion, acute infarction, or significant intracranial injury. Vascular: No hyperdense vessel or unexpected calcification. Skull: No acute calvarial abnormality. Sinuses/Orbits: Visualized paranasal sinuses and mastoids clear. Orbital soft tissues unremarkable. Other: None CT CERVICAL SPINE FINDINGS Alignment: Normal Skull base and vertebrae: No acute fracture. No primary bone lesion or focal pathologic process. Soft tissues and spinal canal: No prevertebral fluid or swelling. No visible canal hematoma. Disc levels:  Normal Upper chest: Normal Other: None IMPRESSION: No acute intracranial abnormality. No acute bony abnormality in the cervical spine. Electronically Signed   By: Charlett Nose M.D.   On: 03/28/2019 00:19   Ct Cervical Spine Wo Contrast  Result Date: 03/28/2019 CLINICAL DATA:  Headache.  Fall. EXAM: CT HEAD WITHOUT CONTRAST CT CERVICAL SPINE WITHOUT CONTRAST TECHNIQUE: Multidetector CT imaging of the head and cervical spine was performed following the standard protocol without intravenous contrast. Multiplanar CT image reconstructions of the cervical spine were also generated. COMPARISON:  12/19/2016 FINDINGS: CT HEAD FINDINGS Brain: No acute intracranial abnormality. Specifically, no hemorrhage, hydrocephalus, mass lesion, acute infarction, or significant intracranial injury. Vascular: No hyperdense vessel or unexpected calcification. Skull: No acute calvarial abnormality. Sinuses/Orbits: Visualized paranasal sinuses and mastoids clear. Orbital soft tissues unremarkable. Other: None CT CERVICAL SPINE FINDINGS Alignment: Normal Skull base and vertebrae: No acute fracture. No primary bone lesion or focal pathologic process. Soft tissues and spinal canal: No prevertebral fluid or swelling. No visible canal hematoma. Disc levels:  Normal Upper chest: Normal Other: None  IMPRESSION: No acute intracranial abnormality. No acute bony abnormality in the cervical spine. Electronically Signed   By: Charlett Nose M.D.   On: 03/28/2019 00:19   Dg Chest Port 1 View  Result Date: 03/28/2019 CLINICAL DATA:  Cough EXAM: PORTABLE CHEST 1 VIEW COMPARISON:  05/24/2018 FINDINGS: No focal opacity or pleural effusion. Stable cardiomediastinal silhouette. Valve prostheses. No pneumothorax. IMPRESSION: No active disease. Electronically Signed   By: Jasmine Pang M.D.   On: 03/28/2019 00:40    Lab Data:  CBC: Recent Labs  Lab 03/28/19 0040 03/28/19 1003 03/29/19 0349 03/30/19 0402 03/31/19 0423 04/01/19 0433  WBC 6.1 8.7 7.4 8.5 7.7 6.9  NEUTROABS 3.5 6.2  --   --   --   --   HGB 12.2 12.1 10.4* 9.5* 8.7* 8.3*  HCT 35.6* 35.0* 31.0* 28.2* 26.1* 25.7*  MCV 109.5* 111.5* 110.3* 113.7* 113.5* 116.3*  PLT 415* 373 351 313 304 290   Basic Metabolic Panel: Recent Labs  Lab 03/28/19 0040 03/28/19 1003 03/29/19 0349 03/30/19 0402  NA  132* 134* 134* 134*  K 4.2 4.5 4.4 3.9  CL 100 100 104 104  CO2 22 23 23  21*  GLUCOSE 93 103* 80 84  BUN <5* 6 9 14   CREATININE 0.73 0.87 0.82 0.85  CALCIUM 8.4* 8.6* 8.4* 8.3*  MG  --   --   --  1.7   GFR: Estimated Creatinine Clearance: 58.5 mL/min (by C-G formula based on SCr of 0.85 mg/dL). Liver Function Tests: Recent Labs  Lab 03/28/19 0040 03/28/19 1003  AST 35 35  ALT 18 17  ALKPHOS 132* 139*  BILITOT 0.3 <0.1*  PROT 6.6 6.4*  ALBUMIN 3.0* 3.0*   No results for input(s): LIPASE, AMYLASE in the last 168 hours. No results for input(s): AMMONIA in the last 168 hours. Coagulation Profile: Recent Labs  Lab 03/28/19 0040 03/29/19 0349 03/30/19 0402 03/31/19 0423 04/01/19 0433  INR 3.5* 2.3* 1.4* 1.3* 1.3*   Cardiac Enzymes: No results for input(s): CKTOTAL, CKMB, CKMBINDEX, TROPONINI in the last 168 hours. BNP (last 3 results) No results for input(s): PROBNP in the last 8760 hours. HbA1C: No results for  input(s): HGBA1C in the last 72 hours. CBG: No results for input(s): GLUCAP in the last 168 hours. Lipid Profile: No results for input(s): CHOL, HDL, LDLCALC, TRIG, CHOLHDL, LDLDIRECT in the last 72 hours. Thyroid Function Tests: No results for input(s): TSH, T4TOTAL, FREET4, T3FREE, THYROIDAB in the last 72 hours. Anemia Panel: Recent Labs    04/01/19 1218  RETICCTPCT 2.5   Urine analysis:    Component Value Date/Time   COLORURINE YELLOW 03/28/2019 0040   APPEARANCEUR CLEAR 03/28/2019 0040   LABSPEC 1.003 (L) 03/28/2019 0040   PHURINE 6.0 03/28/2019 0040   GLUCOSEU NEGATIVE 03/28/2019 0040   HGBUR LARGE (A) 03/28/2019 0040   BILIRUBINUR NEGATIVE 03/28/2019 0040   KETONESUR NEGATIVE 03/28/2019 0040   PROTEINUR NEGATIVE 03/28/2019 0040   UROBILINOGEN 0.2 06/04/2015 1533   NITRITE NEGATIVE 03/28/2019 0040   LEUKOCYTESUR NEGATIVE 03/28/2019 0040     Rula Keniston M.D. [facility] 04/01/2019, 1:14 PM  Pager: [phone] Between 7am to 7pm - call Pager - [phone]  After 7pm go to [email or web address] - password [email or web address]  Call night coverage person covering after 7pm

## 2019-04-01 NOTE — Progress Notes (Addendum)
ANTICOAGULATION CONSULT NOTE - Follow Up Consult  Pharmacy Consult for Warfarin and SQ Lovenox Indication: mechanical MVR  Allergies  Allergen Reactions  . Oxycodone Itching    Patient Measurements: Height: 5\' 2"  (157.5 cm) Weight: 129 lb 10.1 oz (58.8 kg) IBW/kg (Calculated) : 50.1 .  Vital Signs: Temp: 97.9 F (36.6 C) (07/06 1321) Temp Source: Oral (07/06 1321) BP: 120/72 (07/06 1321) Pulse Rate: 91 (07/06 1321)  Labs: Recent Labs    03/30/19 0402 03/31/19 0423 03/31/19 1429 04/01/19 0433 04/01/19 1218  HGB 9.5* 8.7*  --  8.3*  --   HCT 28.2* 26.1*  --  25.7*  --   PLT 313 304  --  290  --   LABPROT 16.7* 15.9*  --  16.2*  --   INR 1.4* 1.3*  --  1.3*  --   HEPARINUNFRC 0.62 0.28* 0.71* 0.57 0.68  CREATININE 0.85  --   --   --   --     Estimated Creatinine Clearance: 58.5 mL/min (by C-G formula based on SCr of 0.85 mg/dL).  Assessment: 58 yo F on warfarin PTA for mechanical mitral valve.  Pharmacy was consulted to dose heparin for bridge therapy while warfarin on hold for possible pacemaker placement planned Monday 7/6.[name]  Home warfarin per coumadin clinic dosing: 7.5 mg MWF and 5 mg TTSS. Last dose 6/30 at 1800.  INR goal 2.5 - 3.6 Admission INR therapeutic at 3.5.  HG 12.1, PLTC 373.     . Goal of Therapy:  Heparin level 0.3-0.7 units/ml Monitor platelets by anticoagulation protocol: Yes  INR 2.5 - 3.5 for MVR - per coumadin clinic notes  Today, 04/01/2019: Plans for pacemaker were canceled and warfarin was resumed 7/4 by Dr. Crissie Sickles Hep level 0.68 on Heparin 900 units/hr, rate was decreased slightly INR 1.3 after 5, 10mg  Warfarin Hgb 12.1 on admit >> 8.3, Plt wnl  Plan:  Coumadin 10 mg po x 1 at 1800 Continue Heparin drip at 900 units/hr Daily INR, heparin level, CBC To continue heparin bridge until INR > 2.5 per [facility] note  [name] PharmD Pager [phone] 04/01/2019, 1:46 PM   1500 Addendum Pt prefers using Lovenox bridge, discontinue  Heparin infusion Stop Heparin at 1700, Lovenox 60mg  x1 at 1800 Then Lovenox 60mg  q12 beginning 8a on 7/7  [name] PharmD Pager [phone] 04/01/2019, 3:08 PM

## 2019-04-01 NOTE — Progress Notes (Addendum)
Pt asked me to called MD and to inform MD she wants the Heparin discontinued. Attempted to talk with pt and educate pt. MD updated made aware. New orders received. SRP, RN

## 2019-04-01 NOTE — Plan of Care (Signed)

## 2019-04-01 NOTE — Progress Notes (Signed)
ANTICOAGULATION CONSULT NOTE - Follow Up Consult  Pharmacy Consult for Heparin Indication: mechanical MVR  Allergies  Allergen Reactions  . Oxycodone Itching    Patient Measurements: Height: 5\' 2"  (157.5 cm) Weight: 129 lb 10.1 oz (58.8 kg) IBW/kg (Calculated) : 50.1 Heparin Dosing Weight:   Vital Signs: Temp: 98.6 F (37 C) (07/06 0418) BP: 130/76 (07/06 0418) Pulse Rate: 94 (07/06 0418)  Labs: Recent Labs    03/30/19 0402 03/31/19 0423 03/31/19 1429 04/01/19 0433  HGB 9.5* 8.7*  --  8.3*  HCT 28.2* 26.1*  --  25.7*  PLT 313 304  --  290  LABPROT 16.7* 15.9*  --  16.2*  INR 1.4* 1.3*  --  1.3*  HEPARINUNFRC 0.62 0.28* 0.71* 0.57  CREATININE 0.85  --   --   --     Estimated Creatinine Clearance: 58.5 mL/min (by C-G formula based on SCr of 0.85 mg/dL).   Medications:  Infusions:  . heparin 900 Units/hr (03/31/19 1609)    Assessment: Patient with heparin level at goal.  No heparin issues noted.  Goal of Therapy:  Heparin level 0.3-0.7 units/ml Monitor platelets by anticoagulation protocol: Yes   Plan:  Continue heparin drip at current rate Recheck level at [place], [place] 04/01/2019,6:40 AM

## 2019-04-01 NOTE — Plan of Care (Signed)
  Problem: Education: Goal: Knowledge of General Education information will improve Description: Including pain rating scale, medication(s)/side effects and non-pharmacologic comfort measures 04/01/2019 2012 by Zadie Rhine, RN Outcome: Progressing 04/01/2019 2012 by Zadie Rhine, RN Outcome: Progressing   Problem: Health Behavior/Discharge Planning: Goal: Ability to manage health-related needs will improve Outcome: Progressing   Problem: Clinical Measurements: Goal: Ability to maintain clinical measurements within normal limits will improve Outcome: Progressing

## 2019-04-02 LAB — PROTIME-INR
INR: 1.5 — ABNORMAL HIGH (ref 0.8–1.2)
Prothrombin Time: 17.8 seconds — ABNORMAL HIGH (ref 11.4–15.2)

## 2019-04-02 LAB — CBC
HCT: 25.3 % — ABNORMAL LOW (ref 36.0–46.0)
Hemoglobin: 8 g/dL — ABNORMAL LOW (ref 12.0–15.0)
MCH: 36.9 pg — ABNORMAL HIGH (ref 26.0–34.0)
MCHC: 31.6 g/dL (ref 30.0–36.0)
MCV: 116.6 fL — ABNORMAL HIGH (ref 80.0–100.0)
Platelets: 256 10*3/uL (ref 150–400)
RBC: 2.17 MIL/uL — ABNORMAL LOW (ref 3.87–5.11)
RDW: 15.4 % (ref 11.5–15.5)
WBC: 6.4 10*3/uL (ref 4.0–10.5)
nRBC: 0 % (ref 0.0–0.2)

## 2019-04-02 LAB — OCCULT BLOOD X 1 CARD TO LAB, STOOL: Fecal Occult Bld: NEGATIVE

## 2019-04-02 MED ORDER — WARFARIN SODIUM 5 MG PO TABS
12.5000 mg | ORAL_TABLET | Freq: Once | ORAL | Status: AC
  Administered 2019-04-02: 12.5 mg via ORAL
  Filled 2019-04-02: qty 1

## 2019-04-02 MED ORDER — WITCH HAZEL-GLYCERIN EX PADS
MEDICATED_PAD | CUTANEOUS | Status: DC | PRN
  Filled 2019-04-02: qty 100

## 2019-04-02 MED ORDER — ONDANSETRON HCL 4 MG/2ML IJ SOLN: 4.0000 mg | Freq: Four times a day (QID) | INTRAMUSCULAR | Status: DC | PRN

## 2019-04-02 NOTE — Progress Notes (Addendum)
Progress Note  Patient Name: Mary [name] Date of Encounter: 04/02/2019  Primary Cardiologist: Candee Furbish, MD   Subjective   Pt is feeling well. She has been up walking the halls without any issues or lightheadedness. No near syncope or palpitations.   Inpatient Medications    Scheduled Meds: . aspirin EC  81 mg Oral Daily  . enoxaparin (LOVENOX) injection  60 mg Subcutaneous Q12H  . vitamin B-12  1,000 mcg Oral Daily  . warfarin  12.5 mg Oral ONCE-1800  . Warfarin - Pharmacist Dosing Inpatient   Does not apply q1800   Continuous Infusions:  PRN Meds: acetaminophen **OR** acetaminophen, ondansetron   Vital Signs    Vitals:   04/01/19 0420 04/01/19 1321 04/01/19 2149 04/02/19 0638  BP:  120/72 134/71 113/79  Pulse:  91 94 91  Resp:  14 18 18   Temp:  97.9 F (36.6 C) 98 F (36.7 C) 98.4 F (36.9 C)  TempSrc:  Oral Oral Oral  SpO2:  100% 100% 100%  Weight: 58.8 kg   57.4 kg  Height:       No intake or output data in the 24 hours ending 04/02/19 1142 Last 3 Weights 04/02/2019 04/01/2019 03/31/2019  Weight (lbs) 126 lb 8 oz 129 lb 10.1 oz 123 lb 11.2 oz  Weight (kg) 57.38 kg 58.8 kg 56.11 kg      Telemetry    Sinus rhythm 80s-low 100s, rare short burst of NSVT.  No pauses or significant bradycardia.- Personally Reviewed  ECG    No new tracings- Personally Reviewed  Physical Exam   GEN: No acute distress.   Neck: No JVD Cardiac: RRR, no murmurs, crisp mechanical valve click Respiratory: Clear to auscultation bilaterally. GI: Soft, nontender, non-distended  MS: No edema; No deformity. Neuro:  Nonfocal  Psych: Normal affect   Labs    High Sensitivity Troponin:  No results for input(s): TROPONINIHS in the last 720 hours.    Cardiac EnzymesNo results for input(s): TROPONINI in the last 168 hours. No results for input(s): TROPIPOC in the last 168 hours.   Chemistry Recent Labs  Lab 03/28/19 0040 03/28/19 1003 03/29/19 0349 03/30/19 0402  NA 132*  134* 134* 134*  K 4.2 4.5 4.4 3.9  CL 100 100 104 104  CO2 22 23 23  21*  GLUCOSE 93 103* 80 84  BUN <5* 6 9 14   CREATININE 0.73 0.87 0.82 0.85  CALCIUM 8.4* 8.6* 8.4* 8.3*  PROT 6.6 6.4*  --   --   ALBUMIN 3.0* 3.0*  --   --   AST 35 35  --   --   ALT 18 17  --   --   ALKPHOS 132* 139*  --   --   BILITOT 0.3 <0.1*  --   --   GFRNONAA >60 >60 >60 >60  GFRAA >60 >60 >60 >60  ANIONGAP 10 11 7 9      Hematology Recent Labs  Lab 03/31/19 0423 04/01/19 0433 04/01/19 1218 04/02/19 0421  WBC 7.7 6.9  --  6.4  RBC 2.30* 2.21* 2.67* 2.17*  HGB 8.7* 8.3*  --  8.0*  HCT 26.1* 25.7*  --  25.3*  MCV 113.5* 116.3*  --  116.6*  MCH 37.8* 37.6*  --  36.9*  MCHC 33.3 32.3  --  31.6  RDW 14.9 15.3  --  15.4  PLT 304 290  --  256    BNPNo results for input(s): BNP, PROBNP in the last 168  hours.   DDimer No results for input(s): DDIMER in the last 168 hours.   Radiology    No results found.  Cardiac Studies   Echocardiogram 03/06/2019 IMPRESSIONS  1. The left ventricle has hyperdynamic systolic function, with an ejection fraction of >65%. The cavity size was normal. Left ventricular diastolic function could not be evaluated due to nondiagnostic images. No evidence of left ventricular regional  wall motion abnormalities. 2. The right ventricle has normal systolic function. The cavity was normal. There is no increase in right ventricular wall thickness. 3. S/P 39mm [employer] mechanical MVR of the mitral valve leaflet. Doppler interrogation of the MVR was not performed. Cannot assess MR due to valve shadowing from prosthesis. 4. S/P TV repair with annuloplasty ring wiht trivial TR 5. The aortic valve is tricuspid. Mild sclerosis of the aortic valve. Aortic valve regurgitation was not assessed by color flow Doppler.  Holter Monitor 03/2018 Study Highlights   Normal sinus rhythm and sinus tachycardia with average heart rate 109 bpm. Heart rate ranged from 27 to 171  bpm.  Intermittent complete heart block with slow ventricular response at 47 bpm during the daytime.  SVT at 171 bpm  Occasional PVCs and wide-complex tachycardia up to 5 beats      Patient Profile     58 y.o. female with PMH mechanical mitral valve and s/p TV repair, chronic diastolic heart failure, transient complete heart block, hypertension, stroke. She is followed for an episode of syncope that occurred after palpitations.   Assessment & Plan    Recurrent syncope -History of transient complete heart block and tachyarrhythmia in the past, declined pacemaker at that time. -So far no severe bradycardia, pauses or significant arrhythmias noted.  Patient has had rare brief NSVT. No symptoms of lightheadness of near syncope.  -Seen by Dr. Lovena Le for EP evaluation during this hospitalization.  In coordination with the patient it was decided for implanted loop recorder prior to consideration of permanent pacemaker. -Plan is for patient to have loop recorder implanted as an outpatient on day of discharge or shortly after.  Plan is to call the EP APP on day of discharge to arrange for loop recorder placement.  Tommye Standard is aware.  History of mechanical mitral valve and tricuspid valve repair -Stable on recent echo 03/06/2019 -INR goal 2.5-3.5 for mechanical valve.  Coumadin was held and patient bridged with heparin.  Coumadin has now been restarted.  Patient has now been switched to Lovenox bridge awaiting therapeutic INR.  Patient declines to be discharged with Lovenox.  INR is slowly rising, 1.5 today.  Anemia -Hemoglobin decreasing from 12.2 on admission to 8.0 today.  Anemia work-up being done by internal medicine.     For questions or updates, please contact [place] Please consult [email or web address] for contact info under    History and all data above reviewed.  Patient examined.  I agree with the findings as above. No pain.  No SOB.   The patient exam reveals COR:RRR  ,   Lungs: Clear  ,  Abd: Positive bowel sounds, no rebound no guarding, Ext no edema   .  All available labs, radiology testing, previous records reviewed. Agree with documented assessment and plan. MVR:  No new suggestions.  Talked to her again about being home with Lovenox but she would not be able to do this.  We will follow as needed.  Consider pediatric tubes for blood draws and limiting.  Syncope:  No arrhythmias no further events.  Jeneen Rinks Pema Thomure  12:18 PM  04/02/2019       Signed, Daune Perch, NP  04/02/2019, 11:42 AM

## 2019-04-02 NOTE — Progress Notes (Signed)
ANTICOAGULATION CONSULT NOTE - Follow Up Consult  Pharmacy Consult for Warfarin and Lovenox bridging Indication: mechanical MVR  Allergies  Allergen Reactions  . Oxycodone Itching   Patient Measurements: Height: 5\' 2"  (157.5 cm) Weight: 126 lb 8 oz (57.4 kg) IBW/kg (Calculated) : 50.1 .  Vital Signs: Temp: 98.4 F (36.9 C) (07/07 0638) Temp Source: Oral (07/07 0175) BP: 113/79 (07/07 1025) Pulse Rate: 91 (07/07 0638)  Labs: Recent Labs    03/31/19 0423 03/31/19 1429 04/01/19 0433 04/01/19 1218 04/02/19 0421  HGB 8.7*  --  8.3*  --  8.0*  HCT 26.1*  --  25.7*  --  25.3*  PLT 304  --  290  --  256  LABPROT 15.9*  --  16.2*  --  17.8*  INR 1.3*  --  1.3*  --  1.5*  HEPARINUNFRC 0.28* 0.71* 0.57 0.68  --     Estimated Creatinine Clearance: 58.5 mL/min (by C-G formula based on SCr of 0.85 mg/dL).  Assessment: 58 yo F on warfarin PTA for mechanical mitral valve.  Pharmacy was consulted to dose heparin for bridge therapy while warfarin on hold for possible pacemaker placement planned Monday 7/6.[name]  Home warfarin per coumadin clinic dosing: 7.5 mg MWF and 5 mg TTSS. Last dose 6/30 at 1800.  INR goal 2.5 - 3.6 Admission INR therapeutic at 3.5.  HG 12.1, PLTC 373.     - ASA 81mg  daily . Goal of Therapy:  Heparin level 0.3-0.7 units/ml Monitor platelets by anticoagulation protocol: Yes  INR 2.5 - 3.5 for MVR - per coumadin clinic notes  Today, 04/02/2019: Plans for pacemaker canceled, Warfarin resumed 7/4 by Dr. Crissie Sickles Pt prefers using Lovenox bridge, Heparin to Lovenox 7/6  INR 1.5 after 5, 10, 10mg  Warfarin Hgb 12.1 on admit >> 8.0, Plt wnl  Plan:  Coumadin 12.5 mg po x 1 at 1800 Lovenox 60mg  SQ q12 Continue Lovenox bridge until INR > 2.5 per [facility] note  [name] PharmD Pager [phone] 04/02/2019, 8:16 AM

## 2019-04-02 NOTE — Progress Notes (Signed)
[facility]                                                                              Patient Demographics  Mary [name], is a 58 y.o. female, DOB - 15-Dec-1960, VOZ:[id number]  Admit date - 03/27/2019   Admitting Physician Eduard Clos, MD  Outpatient Primary MD for the patient is Cain Saupe, MD  Outpatient specialists:   LOS - 5  days   Medical records reviewed and are as summarized below:    Chief Complaint  Patient presents with   Headache       Brief summary   Patient is a 58 year old female with history of mechanical mitral valve, SVT, transient complete heart block, diastolic dysfunction, tricuspid valve repair was admitted with syncopal episode. In ED, tachycardia with heart rate in low 100s, mild hyponatremia 132.  Patient was admitted for syncope.  Cardiology was consulted. COVID-19 test negative  Assessment & Plan    Principal Problem: Recurrent syncope in the setting of extensive cardiac history and arrhythmia given history of SVT, transient heart block. 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 Ladona Ridgel, currently no documented bradycardia episode during hospitalization, hence no indication of pacemaker but recommended loop recorder.  Recommended loop recorder procedure outpatient in the office, however if she develops symptomatic AV block or sinus node dysfunction, bradycardia, then will place pacemaker -Coumadin restarted, patient does not want to be on IV heparin anymore, started on Lovenox for bridge. -INR 1.5, discussed about Lovenox.  Patient does not think that she will be able to do Lovenox at home.   Active Problems:   H/O mitral valve replacement with mechanical valve, S/P tricuspid valve repair -Continue Lovenox and Coumadin, INR goal 2.5-3.5 -Recent 2D echo in 02/2019 showed EF > 65%  Chronic  diastolic CHF -Currently euvolemic, compensated, stable  Anemia, macrocytic, asymptomatic Anemia panel showed B12 deficiency started on B12 1000 mcg daily Hemoglobin 8.0, currently no indication for transfusion.  No hematemesis, melena or hematochezia FOBT negative  Code Status: Full CODE STATUS DVT Prophylaxis: Lovenox Family Communication: Discussed in detail with the patient, all imaging results, lab results explained to the patient    Disposition Plan: DC home once INR close to goal 2.5-3.5  Time Spent in minutes   25 minutes  Procedures:  None  Consultants:   EP cardiology, Dr. Ladona Ridgel Cardiology, Dr. Delton See  Antimicrobials:   Anti-infectives (From admission, onward)   None         Medications  Scheduled Meds:  aspirin EC  81 mg Oral Daily   enoxaparin (LOVENOX) injection  60 mg Subcutaneous Q12H   vitamin B-12  1,000 mcg Oral Daily   warfarin  12.5 mg Oral ONCE-1800   Warfarin - Pharmacist Dosing Inpatient   Does not apply q1800   Continuous Infusions:  PRN Meds:.acetaminophen **OR** acetaminophen, ondansetron, witch hazel-glycerin      Subjective:   Mary [name] was seen and examined today.  No acute complaints, states will not be able to do Lovenox at home.  No nausea,  vomiting, hematemesis, hematochezia or melena.  No dizziness.  Ambulating in the room..   Patient denies dizziness, chest pain, shortness of breath, abdominal pain, N/V/D/C, new weakness, numbess, tingling. No acute events overnight.    Objective:   Vitals:   04/01/19 0420 04/01/19 1321 04/01/19 2149 04/02/19 0638  BP:  120/72 134/71 113/79  Pulse:  91 94 91  Resp:  14 18 18   Temp:  97.9 F (36.6 C) 98 F (36.7 C) 98.4 F (36.9 C)  TempSrc:  Oral Oral Oral  SpO2:  100% 100% 100%  Weight: 58.8 kg   57.4 kg  Height:       No intake or output data in the 24 hours ending 04/02/19 1233   Wt Readings from Last 3 Encounters:  04/02/19 57.4 kg  02/12/19 56.8 kg  12/05/18  58.1 kg    Physical Exam  General: Alert and oriented x 3, NAD  Eyes:   HEENT:  Atraumatic, normocephalic  Cardiovascular: S1 S2 clear, RRR. No pedal edema b/l  Respiratory: CTAB, no wheezing, rales or rhonchi  Gastrointestinal: Soft, nontender, nondistended, NBS  Ext: no pedal edema bilaterally  Neuro: no new deficits  Musculoskeletal: No cyanosis, clubbing  Skin: No rashes  Psych: Normal affect and demeanor, alert and oriented x3        Data Reviewed:  I have personally reviewed following labs and imaging studies  Micro Results Recent Results (from the past 240 hour(s))  SARS Coronavirus 2 (CEPHEID - Performed in [facility] hospital lab), Hosp Order     Status: None   Collection Time: 03/28/19  4:41 AM   Specimen: Nasopharyngeal Swab  Result Value Ref Range Status   SARS Coronavirus 2 NEGATIVE NEGATIVE Final    Comment: (NOTE) If result is NEGATIVE SARS-CoV-2 target nucleic acids are NOT DETECTED. The SARS-CoV-2 RNA is generally detectable in upper and lower  respiratory specimens during the acute phase of infection. The lowest  concentration of SARS-CoV-2 viral copies this assay can detect is 250  copies / mL. A negative result does not preclude SARS-CoV-2 infection  and should not be used as the sole basis for treatment or other  patient management decisions.  A negative result may occur with  improper specimen collection / handling, submission of specimen other  than nasopharyngeal swab, presence of viral mutation(s) within the  areas targeted by this assay, and inadequate number of viral copies  (<250 copies / mL). A negative result must be combined with clinical  observations, patient history, and epidemiological information. If result is POSITIVE SARS-CoV-2 target nucleic acids are DETECTED. The SARS-CoV-2 RNA is generally detectable in upper and lower  respiratory specimens dur ing the acute phase of infection.  Positive  results are indicative of  active infection with SARS-CoV-2.  Clinical  correlation with patient history and other diagnostic information is  necessary to determine patient infection status.  Positive results do  not rule out bacterial infection or co-infection with other viruses. If result is PRESUMPTIVE POSTIVE SARS-CoV-2 nucleic acids MAY BE PRESENT.   A presumptive positive result was obtained on the submitted specimen  and confirmed on repeat testing.  While 2019 novel coronavirus  (SARS-CoV-2) nucleic acids may be present in the submitted sample  additional confirmatory testing may be necessary for epidemiological  and / or clinical management purposes  to differentiate between  SARS-CoV-2 and other Sarbecovirus currently known to infect humans.  If clinically indicated additional testing with an alternate test  methodology [phone]) is advised.  The SARS-CoV-2 RNA is generally  detectable in upper and lower respiratory sp ecimens during the acute  phase of infection. The expected result is Negative. Fact Sheet for Patients:  BoilerBrush.com.cy Fact Sheet for Healthcare Providers: https://pope.com/ This test is not yet approved or cleared by the [place] FDA and has been authorized for detection and/or diagnosis of SARS-CoV-2 by FDA under an Emergency Use Authorization (EUA).  This EUA will remain in effect (meaning this test can be used) for the duration of the COVID-19 declaration under Section 564(b)(1) of the Act, 21 U.S.C. section 360bbb-3(b)(1), unless the authorization is terminated or revoked sooner. Performed at [facility], [address], [place], [place] [address]     Radiology Reports Ct Head Wo Contrast  Result Date: 03/28/2019 CLINICAL DATA:  Headache.  Fall. EXAM: CT HEAD WITHOUT CONTRAST CT CERVICAL SPINE WITHOUT CONTRAST TECHNIQUE: Multidetector CT imaging of the head and cervical spine was performed following the  standard protocol without intravenous contrast. Multiplanar CT image reconstructions of the cervical spine were also generated. COMPARISON:  12/19/2016 FINDINGS: CT HEAD FINDINGS Brain: No acute intracranial abnormality. Specifically, no hemorrhage, hydrocephalus, mass lesion, acute infarction, or significant intracranial injury. Vascular: No hyperdense vessel or unexpected calcification. Skull: No acute calvarial abnormality. Sinuses/Orbits: Visualized paranasal sinuses and mastoids clear. Orbital soft tissues unremarkable. Other: None CT CERVICAL SPINE FINDINGS Alignment: Normal Skull base and vertebrae: No acute fracture. No primary bone lesion or focal pathologic process. Soft tissues and spinal canal: No prevertebral fluid or swelling. No visible canal hematoma. Disc levels:  Normal Upper chest: Normal Other: None IMPRESSION: No acute intracranial abnormality. No acute bony abnormality in the cervical spine. Electronically Signed   By: Charlett Nose M.D.   On: 03/28/2019 00:19   Ct Cervical Spine Wo Contrast  Result Date: 03/28/2019 CLINICAL DATA:  Headache.  Fall. EXAM: CT HEAD WITHOUT CONTRAST CT CERVICAL SPINE WITHOUT CONTRAST TECHNIQUE: Multidetector CT imaging of the head and cervical spine was performed following the standard protocol without intravenous contrast. Multiplanar CT image reconstructions of the cervical spine were also generated. COMPARISON:  12/19/2016 FINDINGS: CT HEAD FINDINGS Brain: No acute intracranial abnormality. Specifically, no hemorrhage, hydrocephalus, mass lesion, acute infarction, or significant intracranial injury. Vascular: No hyperdense vessel or unexpected calcification. Skull: No acute calvarial abnormality. Sinuses/Orbits: Visualized paranasal sinuses and mastoids clear. Orbital soft tissues unremarkable. Other: None CT CERVICAL SPINE FINDINGS Alignment: Normal Skull base and vertebrae: No acute fracture. No primary bone lesion or focal pathologic process. Soft tissues  and spinal canal: No prevertebral fluid or swelling. No visible canal hematoma. Disc levels:  Normal Upper chest: Normal Other: None IMPRESSION: No acute intracranial abnormality. No acute bony abnormality in the cervical spine. Electronically Signed   By: Charlett Nose M.D.   On: 03/28/2019 00:19   Dg Chest Port 1 View  Result Date: 03/28/2019 CLINICAL DATA:  Cough EXAM: PORTABLE CHEST 1 VIEW COMPARISON:  05/24/2018 FINDINGS: No focal opacity or pleural effusion. Stable cardiomediastinal silhouette. Valve prostheses. No pneumothorax. IMPRESSION: No active disease. Electronically Signed   By: Jasmine Pang M.D.   On: 03/28/2019 00:40    Lab Data:  CBC: Recent Labs  Lab 03/28/19 0040 03/28/19 1003 03/29/19 0349 03/30/19 0402 03/31/19 0423 04/01/19 0433 04/02/19 0421  WBC 6.1 8.7 7.4 8.5 7.7 6.9 6.4  NEUTROABS 3.5 6.2  --   --   --   --   --   HGB 12.2 12.1 10.4* 9.5* 8.7* 8.3* 8.0*  HCT 35.6* 35.0*  31.0* 28.2* 26.1* 25.7* 25.3*  MCV 109.5* 111.5* 110.3* 113.7* 113.5* 116.3* 116.6*  PLT 415* 373 351 313 304 290 256   Basic Metabolic Panel: Recent Labs  Lab 03/28/19 0040 03/28/19 1003 03/29/19 0349 03/30/19 0402  NA 132* 134* 134* 134*  K 4.2 4.5 4.4 3.9  CL 100 100 104 104  CO2 22 23 23  21*  GLUCOSE 93 103* 80 84  BUN <5* 6 9 14   CREATININE 0.73 0.87 0.82 0.85  CALCIUM 8.4* 8.6* 8.4* 8.3*  MG  --   --   --  1.7   GFR: Estimated Creatinine Clearance: 58.5 mL/min (by C-G formula based on SCr of 0.85 mg/dL). Liver Function Tests: Recent Labs  Lab 03/28/19 0040 03/28/19 1003  AST 35 35  ALT 18 17  ALKPHOS 132* 139*  BILITOT 0.3 <0.1*  PROT 6.6 6.4*  ALBUMIN 3.0* 3.0*   No results for input(s): LIPASE, AMYLASE in the last 168 hours. No results for input(s): AMMONIA in the last 168 hours. Coagulation Profile: Recent Labs  Lab 03/29/19 0349 03/30/19 0402 03/31/19 0423 04/01/19 0433 04/02/19 0421  INR 2.3* 1.4* 1.3* 1.3* 1.5*   Cardiac Enzymes: No results for  input(s): CKTOTAL, CKMB, CKMBINDEX, TROPONINI in the last 168 hours. BNP (last 3 results) No results for input(s): PROBNP in the last 8760 hours. HbA1C: No results for input(s): HGBA1C in the last 72 hours. CBG: No results for input(s): GLUCAP in the last 168 hours. Lipid Profile: No results for input(s): CHOL, HDL, LDLCALC, TRIG, CHOLHDL, LDLDIRECT in the last 72 hours. Thyroid Function Tests: No results for input(s): TSH, T4TOTAL, FREET4, T3FREE, THYROIDAB in the last 72 hours. Anemia Panel: Recent Labs    04/01/19 1218  VITAMINB12 176*  FOLATE 9.3  FERRITIN 183  TIBC 267  IRON 84  RETICCTPCT 2.5   Urine analysis:    Component Value Date/Time   COLORURINE YELLOW 03/28/2019 0040   APPEARANCEUR CLEAR 03/28/2019 0040   LABSPEC 1.003 (L) 03/28/2019 0040   PHURINE 6.0 03/28/2019 0040   GLUCOSEU NEGATIVE 03/28/2019 0040   HGBUR LARGE (A) 03/28/2019 0040   BILIRUBINUR NEGATIVE 03/28/2019 0040   KETONESUR NEGATIVE 03/28/2019 0040   PROTEINUR NEGATIVE 03/28/2019 0040   UROBILINOGEN 0.2 06/04/2015 1533   NITRITE NEGATIVE 03/28/2019 0040   LEUKOCYTESUR NEGATIVE 03/28/2019 0040     Mary [name] M.D. [facility] 04/02/2019, 12:33 PM  Pager: [phone] Between 7am to 7pm - call Pager - [phone]  After 7pm go to [email or web address] - password [email or web address]  Call night coverage person covering after 7pm

## 2019-04-02 NOTE — Plan of Care (Signed)

## 2019-04-03 DIAGNOSIS — D519 Vitamin B12 deficiency anemia, unspecified: Secondary | ICD-10-CM

## 2019-04-03 LAB — CBC
HCT: 25 % — ABNORMAL LOW (ref 36.0–46.0)
Hemoglobin: 8.1 g/dL — ABNORMAL LOW (ref 12.0–15.0)
MCH: 38.2 pg — ABNORMAL HIGH (ref 26.0–34.0)
MCHC: 32.4 g/dL (ref 30.0–36.0)
MCV: 117.9 fL — ABNORMAL HIGH (ref 80.0–100.0)
Platelets: 274 10*3/uL (ref 150–400)
RBC: 2.12 MIL/uL — ABNORMAL LOW (ref 3.87–5.11)
RDW: 15.4 % (ref 11.5–15.5)
WBC: 6.4 10*3/uL (ref 4.0–10.5)
nRBC: 0 % (ref 0.0–0.2)

## 2019-04-03 LAB — PROTIME-INR
INR: 1.7 — ABNORMAL HIGH (ref 0.8–1.2)
Prothrombin Time: 20.1 seconds — ABNORMAL HIGH (ref 11.4–15.2)

## 2019-04-03 MED ORDER — METOPROLOL TARTRATE 5 MG/5ML IV SOLN
10.0000 mg | Freq: Once | INTRAVENOUS | Status: DC
  Filled 2019-04-03: qty 10

## 2019-04-03 MED ORDER — WARFARIN SODIUM 5 MG PO TABS
12.5000 mg | ORAL_TABLET | Freq: Once | ORAL | Status: AC
  Administered 2019-04-03: 12.5 mg via ORAL
  Filled 2019-04-03: qty 1

## 2019-04-03 NOTE — Progress Notes (Signed)
PROGRESS NOTE    Mary [name]   IRS:[id number]  DOB: 08/21/1961  DOA: 03/27/2019 PCP: Antony Blackbird, MD   Brief Narrative:  Mary [name]  is a 58 year old female with history of mechanical mitral valve, SVT, transient complete heart block, diastolic dysfunction, tricuspid valve repair was admitted with syncopal episode. She had been noting palpitations at home. She did hit her head and have a headache after passing out. In ED, tachycardia with heart rate in low 100s, mild hyponatremia 132.  Patient was admitted for syncope.  Cardiology was consulted.   Subjective: She has no complaints today. She has been ambulating in the halls without difficulty.     Assessment & Plan:   Principal Problem:   Syncope - CT head and  C spine unrevealing - no clear cause found for syncopal episode - ? If orthostatic- On admission, SBP on laying 120/93 and on standing 97/79 with HR increase from 101 to 112- did not receive any IVF - she has a h/o complete heart block and SVT in the past but this was not witnessed during this hospital stay.   Active Problems: H/o complete heart block and h/o SVT in the past - cardiology consulted and EP, Dr Lovena Le recommends a loop recorder be placed- she is advised to go to the cardiology office on the day of discharge to have this done    H/O heart valve replacement with mechanical valve - Coumadin initially held for cardiology eval/ possible pacer placement - subsequently started on Heparin and then switched to Lovenox as INR still subtherapeutic - initially, the patient was not wanting to do Lovenox injections at home- today, she has changed her mind but this was after she received her AM dose of Lovenox- next dose of Lovenox due at 8 PM- she will need to administer this herself and will be able to go home tomorrow - she will have INR checked at the coumadin clinic    S/P minimally-invasive tricuspid valve repair  Anemia, macrocytic - HB noted to be 12.1  on 7/2 but dropped to ~ 8 by 7/5- no active bleeding noted, FOB was negatiave - anemia panel consistent with B12 deficiency- B12 level was 176- has been started on B12 replacement     Time spent in minutes: 35 DVT prophylaxis: Lovenox Code Status: Full code Family Communication:  Disposition Plan: home tomorrow Consultants:   cardiology Procedures:   none Antimicrobials:  Anti-infectives (From admission, onward)   None       Objective: Vitals:   04/02/19 1310 04/02/19 1708 04/02/19 2100 04/03/19 0528  BP: (!) 148/87 (!) 144/55 (!) 141/87 (!) 141/86  Pulse: 92 88 92 93  Resp: 19 18 16 16   Temp: 98.3 F (36.8 C) 98.4 F (36.9 C) 98.7 F (37.1 C) 98.5 F (36.9 C)  TempSrc: Oral Oral Oral Oral  SpO2: 100% 100% 100% 100%  Weight:    57.9 kg  Height:        Intake/Output Summary (Last 24 hours) at 04/03/2019 1133 Last data filed at 04/02/2019 1312 Gross per 24 hour  Intake 240 ml  Output --  Net 240 ml   Filed Weights   04/01/19 0420 04/02/19 0638 04/03/19 0528  Weight: 58.8 kg 57.4 kg 57.9 kg    Examination: General exam: Appears comfortable  HEENT: PERRLA, oral mucosa moist, no sclera icterus or thrush Respiratory system: Clear to auscultation. Respiratory effort normal. Cardiovascular system: S1 & S2 heard, RRR.  + mechanical click Gastrointestinal system: Abdomen  soft, non-tender, nondistended. Normal bowel sounds. Central nervous system: Alert and oriented. No focal neurological deficits. Extremities: No cyanosis, clubbing or edema Skin: No rashes or ulcers Psychiatry:  Mood & affect appropriate.     Data Reviewed: I have personally reviewed following labs and imaging studies  CBC: Recent Labs  Lab 03/28/19 0040 03/28/19 1003  03/30/19 0402 03/31/19 0423 04/01/19 0433 04/02/19 0421 04/03/19 0440  WBC 6.1 8.7   < > 8.5 7.7 6.9 6.4 6.4  NEUTROABS 3.5 6.2  --   --   --   --   --   --   HGB 12.2 12.1   < > 9.5* 8.7* 8.3* 8.0* 8.1*  HCT 35.6*  35.0*   < > 28.2* 26.1* 25.7* 25.3* 25.0*  MCV 109.5* 111.5*   < > 113.7* 113.5* 116.3* 116.6* 117.9*  PLT 415* 373   < > 313 304 290 256 274   < > = values in this interval not displayed.   Basic Metabolic Panel: Recent Labs  Lab 03/28/19 0040 03/28/19 1003 03/29/19 0349 03/30/19 0402  NA 132* 134* 134* 134*  K 4.2 4.5 4.4 3.9  CL 100 100 104 104  CO2 22 23 23  21*  GLUCOSE 93 103* 80 84  BUN <5* 6 9 14   CREATININE 0.73 0.87 0.82 0.85  CALCIUM 8.4* 8.6* 8.4* 8.3*  MG  --   --   --  1.7   GFR: Estimated Creatinine Clearance: 58.5 mL/min (by C-G formula based on SCr of 0.85 mg/dL). Liver Function Tests: Recent Labs  Lab 03/28/19 0040 03/28/19 1003  AST 35 35  ALT 18 17  ALKPHOS 132* 139*  BILITOT 0.3 <0.1*  PROT 6.6 6.4*  ALBUMIN 3.0* 3.0*   No results for input(s): LIPASE, AMYLASE in the last 168 hours. No results for input(s): AMMONIA in the last 168 hours. Coagulation Profile: Recent Labs  Lab 03/30/19 0402 03/31/19 0423 04/01/19 0433 04/02/19 0421 04/03/19 0440  INR 1.4* 1.3* 1.3* 1.5* 1.7*   Cardiac Enzymes: No results for input(s): CKTOTAL, CKMB, CKMBINDEX, TROPONINI in the last 168 hours. BNP (last 3 results) No results for input(s): PROBNP in the last 8760 hours. HbA1C: No results for input(s): HGBA1C in the last 72 hours. CBG: No results for input(s): GLUCAP in the last 168 hours. Lipid Profile: No results for input(s): CHOL, HDL, LDLCALC, TRIG, CHOLHDL, LDLDIRECT in the last 72 hours. Thyroid Function Tests: No results for input(s): TSH, T4TOTAL, FREET4, T3FREE, THYROIDAB in the last 72 hours. Anemia Panel: Recent Labs    04/01/19 1218  VITAMINB12 176*  FOLATE 9.3  FERRITIN 183  TIBC 267  IRON 84  RETICCTPCT 2.5   Urine analysis:    Component Value Date/Time   COLORURINE YELLOW 03/28/2019 0040   APPEARANCEUR CLEAR 03/28/2019 0040   LABSPEC 1.003 (L) 03/28/2019 0040   PHURINE 6.0 03/28/2019 0040   GLUCOSEU NEGATIVE 03/28/2019 0040    HGBUR LARGE (A) 03/28/2019 0040   BILIRUBINUR NEGATIVE 03/28/2019 0040   KETONESUR NEGATIVE 03/28/2019 0040   PROTEINUR NEGATIVE 03/28/2019 0040   UROBILINOGEN 0.2 06/04/2015 1533   NITRITE NEGATIVE 03/28/2019 0040   LEUKOCYTESUR NEGATIVE 03/28/2019 0040   Sepsis Labs: @LABRCNTIP (procalcitonin:4,lacticidven:4) ) Recent Results (from the past 240 hour(s))  SARS Coronavirus 2 (CEPHEID - Performed in [facility] hospital lab), Hosp Order     Status: None   Collection Time: 03/28/19  4:41 AM   Specimen: Nasopharyngeal Swab  Result Value Ref Range Status   SARS Coronavirus 2 NEGATIVE  NEGATIVE Final    Comment: (NOTE) If result is NEGATIVE SARS-CoV-2 target nucleic acids are NOT DETECTED. The SARS-CoV-2 RNA is generally detectable in upper and lower  respiratory specimens during the acute phase of infection. The lowest  concentration of SARS-CoV-2 viral copies this assay can detect is 250  copies / mL. A negative result does not preclude SARS-CoV-2 infection  and should not be used as the sole basis for treatment or other  patient management decisions.  A negative result may occur with  improper specimen collection / handling, submission of specimen other  than nasopharyngeal swab, presence of viral mutation(s) within the  areas targeted by this assay, and inadequate number of viral copies  (<250 copies / mL). A negative result must be combined with clinical  observations, patient history, and epidemiological information. If result is POSITIVE SARS-CoV-2 target nucleic acids are DETECTED. The SARS-CoV-2 RNA is generally detectable in upper and lower  respiratory specimens dur ing the acute phase of infection.  Positive  results are indicative of active infection with SARS-CoV-2.  Clinical  correlation with patient history and other diagnostic information is  necessary to determine patient infection status.  Positive results do  not rule out bacterial infection or co-infection with  other viruses. If result is PRESUMPTIVE POSTIVE SARS-CoV-2 nucleic acids MAY BE PRESENT.   A presumptive positive result was obtained on the submitted specimen  and confirmed on repeat testing.  While 2019 novel coronavirus  (SARS-CoV-2) nucleic acids may be present in the submitted sample  additional confirmatory testing may be necessary for epidemiological  and / or clinical management purposes  to differentiate between  SARS-CoV-2 and other Sarbecovirus currently known to infect humans.  If clinically indicated additional testing with an alternate test  methodology [phone]) is advised. The SARS-CoV-2 RNA is generally  detectable in upper and lower respiratory sp ecimens during the acute  phase of infection. The expected result is Negative. Fact Sheet for Patients:  StrictlyIdeas.no Fact Sheet for Healthcare Providers: BankingDealers.co.za This test is not yet approved or cleared by the [place] FDA and has been authorized for detection and/or diagnosis of SARS-CoV-2 by FDA under an Emergency Use Authorization (EUA).  This EUA will remain in effect (meaning this test can be used) for the duration of the COVID-19 declaration under Section 564(b)(1) of the Act, 21 U.S.C. section 360bbb-3(b)(1), unless the authorization is terminated or revoked sooner. Performed at [facility], [place] [address], [place], [place] [address]          Radiology Studies: No results found.    Scheduled Meds:  aspirin EC  81 mg Oral Daily   enoxaparin (LOVENOX) injection  60 mg Subcutaneous Q12H   vitamin B-12  1,000 mcg Oral Daily   warfarin  12.5 mg Oral ONCE-1800   Warfarin - Pharmacist Dosing Inpatient   Does not apply q1800   Continuous Infusions:   LOS: 6 days      Debbe Odea, MD [facility] Pager: [email or web address] Password [facility] 04/03/2019, 11:33 AM

## 2019-04-03 NOTE — Progress Notes (Signed)
ANTICOAGULATION CONSULT NOTE - Follow Up Consult  Pharmacy Consult for Warfarin and Lovenox bridging Indication: mechanical MVR  Allergies  Allergen Reactions  . Oxycodone Itching   Patient Measurements: Height: 5\' 2"  (157.5 cm) Weight: 127 lb 11.2 oz (57.9 kg) IBW/kg (Calculated) : 50.1 .  Vital Signs: Temp: 98.5 F (36.9 C) (07/08 0528) Temp Source: Oral (07/08 0528) BP: 141/86 (07/08 0528) Pulse Rate: 93 (07/08 0528)  Labs: Recent Labs    03/31/19 1429  04/01/19 0433 04/01/19 1218 04/02/19 0421 04/03/19 0440  HGB  --    < > 8.3*  --  8.0* 8.1*  HCT  --   --  25.7*  --  25.3* 25.0*  PLT  --   --  290  --  256 274  LABPROT  --   --  16.2*  --  17.8* 20.1*  INR  --   --  1.3*  --  1.5* 1.7*  HEPARINUNFRC 0.71*  --  0.57 0.68  --   --    < > = values in this interval not displayed.    Estimated Creatinine Clearance: 58.5 mL/min (by C-G formula based on SCr of 0.85 mg/dL).  Assessment: 58 yo F on warfarin PTA for mechanical mitral valve.  Pharmacy was consulted to dose heparin for bridge therapy while warfarin on hold for possible pacemaker placement planned Monday 7/6.[name]  Home warfarin per coumadin clinic dosing: 7.5 mg MWF and 5 mg TTSS. Last dose 6/30 at 1800.  INR goal 2.5 - 3.6 Admission INR therapeutic at 3.5.  HG 12.1, PLTC 373.     - ASA 81mg  daily . Goal of Therapy:  Heparin level 0.3-0.7 units/ml Monitor platelets by anticoagulation protocol: Yes  INR 2.5 - 3.5 for MVR - per coumadin clinic notes  Today, 04/03/2019: Plans for pacemaker canceled, Warfarin resumed 7/4 by Dr. Crissie Sickles Plan to place loop recorder as outpt on day of d/c or after - then decision on pacemaker Pt prefers using Lovenox bridge, Heparin to Lovenox 7/6  INR 1.7 after 5, 10, 10, 12.5mg  Warfarin Hgb 12.1 on admit >> 8.1, Plt wnl  Plan:  Coumadin 12.5 mg po x 1 at 1800 Lovenox 60mg  SQ q12 Continue Lovenox bridge until INR > 2.5 per [facility] note  [name] PharmD Pager  [phone] 04/03/2019, 8:24 AM

## 2019-04-03 NOTE — Progress Notes (Signed)
Arranging with EP for ILR placement.  Tomorrow with discharge.  Does not need to be NPO.

## 2019-04-03 NOTE — Progress Notes (Addendum)
1043: Patient states she is now willing to administer the Lovenox shots to herself at home. She would like to go home today or tomorrow is possible. Paged Wynelle Cleveland, MD with the update.  1050: Per Dr. Wynelle Cleveland, if patient successfully administers shots tonight and tomorrow morning, then discharge will be likely tomorrow.

## 2019-04-04 ENCOUNTER — Telehealth: Payer: Self-pay | Admitting: Licensed Clinical Social Worker

## 2019-04-04 DIAGNOSIS — D539 Nutritional anemia, unspecified: Secondary | ICD-10-CM

## 2019-04-04 DIAGNOSIS — D519 Vitamin B12 deficiency anemia, unspecified: Secondary | ICD-10-CM

## 2019-04-04 LAB — CBC
HCT: 24.4 % — ABNORMAL LOW (ref 36.0–46.0)
Hemoglobin: 7.8 g/dL — ABNORMAL LOW (ref 12.0–15.0)
MCH: 37.1 pg — ABNORMAL HIGH (ref 26.0–34.0)
MCHC: 32 g/dL (ref 30.0–36.0)
MCV: 116.2 fL — ABNORMAL HIGH (ref 80.0–100.0)
Platelets: 262 10*3/uL (ref 150–400)
RBC: 2.1 MIL/uL — ABNORMAL LOW (ref 3.87–5.11)
RDW: 15.4 % (ref 11.5–15.5)
WBC: 6.2 10*3/uL (ref 4.0–10.5)
nRBC: 0 % (ref 0.0–0.2)

## 2019-04-04 LAB — PROTIME-INR
INR: 2.2 — ABNORMAL HIGH (ref 0.8–1.2)
Prothrombin Time: 24.2 seconds — ABNORMAL HIGH (ref 11.4–15.2)

## 2019-04-04 MED ORDER — CYANOCOBALAMIN 1000 MCG PO TABS: 1000.0000 ug | ORAL_TABLET | Freq: Every day | ORAL | 0 refills | Status: DC

## 2019-04-04 MED ORDER — CYANOCOBALAMIN 1000 MCG/ML IJ SOLN
1000.0000 ug | Freq: Once | INTRAMUSCULAR | Status: DC
  Filled 2019-04-04: qty 1

## 2019-04-04 MED ORDER — ENOXAPARIN SODIUM 60 MG/0.6ML ~~LOC~~ SOLN: 60.0000 mg | Freq: Two times a day (BID) | SUBCUTANEOUS | 0 refills | Status: DC

## 2019-04-04 NOTE — Progress Notes (Signed)
Pt c/o of (L) sided chest pain.  Rates 7/10 and states it is squeezing in natural.  BP elevated at 175/108 pulse 130.  EKG obtained. Chest pain lasted approx 3 minutes.  Pt states pain relieved on its own.  K. Baltazar Najjar notified metoprolol 10 mg IV ordered.  Pt refused medication stating she was feeling alright now.

## 2019-04-04 NOTE — TOC Transition Note (Signed)
Transition of Care [facility]) - CM/SW Discharge Note   Patient Details  Name: Mary [name] MRN: [id number] Date of Birth: 11-23-1960  [facility] CM/SW Contact:  Lynnell Catalan, RN Phone Number: 04/04/2019, 9:37 AM   Clinical Narrative:    Match letter provided to pt. Pt declined bus pass for DC home. Marney Doctor RN,BSN [phone]

## 2019-04-04 NOTE — Discharge Instructions (Signed)
You must have your INR checked tomorrow. Your doctor will tell you if you should stop the Lovenox after your INR is checked.   You were cared for by a hospitalist during your hospital stay. If you have any questions about your discharge medications or the care you received while you were in the hospital after you are discharged, you can call the unit and asked to speak with the hospitalist on call if the hospitalist that took care of you is not available. Once you are discharged, your primary care physician will handle any further medical issues.   Please note that NO REFILLS for any discharge medications will be authorized once you are discharged, as it is imperative that you return to your primary care physician (or establish a relationship with a primary care physician if you do not have one) for your aftercare needs so that they can reassess your need for medications and monitor your lab values.  Please take all your medications with you for your next visit with your Primary MD. Please ask your Primary MD to get all Hospital records sent to his/her office. Please request your Primary MD to go over all hospital test results at the follow up.   If you experience worsening of your admission symptoms, develop shortness of breath, chest pain, suicidal or homicidal thoughts or a life threatening emergency, you must seek medical attention immediately by calling 911 or calling your MD.   Dennis Bast must read the complete instructions/literature along with all the possible adverse reactions/side effects for all the medicines you take including new medications that have been prescribed to you. Take new medicines after you have completely understood and accpet all the possible adverse reactions/side effects.    Do not drive when taking pain medications or sedatives.     Do not take more than prescribed Pain, Sleep and Anxiety Medications   If you have smoked or chewed Tobacco in the last 2 yrs please stop. Stop  any regular alcohol  and or recreational drug use.   Wear Seat belts while driving.  For appt on 7/14 arrive 15 min early to check in , ok to eat that day, shower before coming in.  Dr. Lovena Le will place loop recorder.

## 2019-04-04 NOTE — Telephone Encounter (Signed)
CSW referred to assist patient with transportation to follow up appointments. CSW contacted patient who states she just got home from the hospital. Patient reports she was approved for food stamps and thrilled as she was previously on the Covid relief food program. She has her medications and plenty of food although transportation can be a challenge at times.  She reports her son will bring to her appointment tomorrow but may need assistance for her appointment on Tuesday. CSW provided number to call if transport needed. Patient very appreciative of the assistance and support. CSW available as needed. Raquel Sarna, [place], [place]

## 2019-04-04 NOTE — Progress Notes (Signed)
ANTICOAGULATION CONSULT NOTE - Follow Up Consult  Pharmacy Consult for Warfarin and Lovenox bridging Indication: mechanical MVR  Allergies  Allergen Reactions  . Oxycodone Itching   Patient Measurements: Height: 5\' 2"  (157.5 cm) Weight: 123 lb 14.4 oz (56.2 kg) IBW/kg (Calculated) : 50.1 .  Vital Signs: Temp: 98.1 F (36.7 C) (07/09 0458) Temp Source: Oral (07/09 0458) BP: 121/80 (07/09 0458) Pulse Rate: 72 (07/09 0600)  Labs: Recent Labs    04/01/19 1218  04/02/19 0421 04/03/19 0440 04/04/19 0434  HGB  --    < > 8.0* 8.1* 7.8*  HCT  --   --  25.3* 25.0* 24.4*  PLT  --   --  256 274 262  LABPROT  --   --  17.8* 20.1* 24.2*  INR  --   --  1.5* 1.7* 2.2*  HEPARINUNFRC 0.68  --   --   --   --    < > = values in this interval not displayed.    Estimated Creatinine Clearance: 58.5 mL/min (by C-G formula based on SCr of 0.85 mg/dL).  Assessment: 58 yo F on warfarin PTA for mechanical mitral valve.  Pharmacy was consulted to dose heparin for bridge therapy while warfarin on hold for possible pacemaker placement planned Monday 7/6.[name]  Home warfarin per coumadin clinic dosing: 7.5 mg MWF and 5 mg TTSS. Last dose 6/30 at 1800.  INR goal 2.5 - 3.6 Admission INR therapeutic at 3.5.  HG 12.1, PLTC 373.     - ASA 81mg  daily . Goal of Therapy:  Heparin level 0.3-0.7 units/ml Monitor platelets by anticoagulation protocol: Yes  INR 2.5 - 3.5 for MVR - per coumadin clinic notes  Today, 04/04/2019: Plans for pacemaker canceled, Warfarin resumed 7/4 by Dr. Crissie Sickles Plan to place loop recorder as outpt on day of d/c or after - then decision on pacemaker Pt prefers using Lovenox bridge, Heparin to Lovenox 7/6  INR 2.2 after 5, 10, 10, 12.5, 12.5mg  Warfarin Hgb 12.1 on admit >> 8.1, Plt wnl  Plan:  Coumadin: today's dose at home per MD instruction, none in hospital prior to discharge Lovenox 60mg  SQ q12 Continue Lovenox bridge until INR > 2.5 per TR[facility] note  [name]  PharmD Pager [phone] 04/04/2019, 9:52 AM

## 2019-04-04 NOTE — Discharge Summary (Addendum)
Physician Discharge Summary  Mary [name] JIR:[id number] DOB: Sep 18, 1961 DOA: 03/27/2019  PCP: Antony Blackbird, MD  Admit date: 03/27/2019 Discharge date: 04/04/2019  Admitted From: home Disposition:  home   Recommendations for Outpatient Follow-up:  1. D/C Lovenox when INR therapeutic 2. Repeat Hb and B12 levels in 1 month      Discharge Condition:  stable   CODE STATUS:  Full code   Consultations:  cardiology    Discharge Diagnoses:  Principal Problem:   Syncope Active Problems:   H/O heart valve replacement with mechanical valve   S/P minimally-invasive tricuspid valve repair   Long term (current) use of anticoagulants   History of rheumatic heart disease   CHB (complete heart block) (HCC)   Macrocytic anemia with vitamin B12 deficiency       Brief Summary: Mary [name]  is a 58 year old female with history of mechanical mitral valve, SVT, transient complete heart block, diastolic dysfunction, tricuspid valve repair who was admitted with syncopal episode. She had been noting palpitations at home.   In ED, noted to be tachycardia with heart rate in low 100s with mild hyponatremia of 132. Patient was admitted for syncope. Cardiology was consulted due to her cardiac history.    Hospital Course:  Principal Problem:   Syncope - CT head and  C spine unrevealing - no clear cause found for syncopal episode - ? If orthostatic- On admission, SBP on laying 120/93 and on standing 97/79 with HR increase from 101 to 112- did not receive any IVF and has not subsequently been orthostatic - cardiology consulted for further syncope work up- she has a h/o complete heart block and SVT in the past but this was not witnessed during this hospital stay.   Active Problems: H/o complete heart block and h/o SVT   - cardiology consulted and EP, Dr Lovena Le, recommends a loop recorder be placed - she is advised by cardiology to go to the cardiology office to have this performed- an appt  has been made for her    H/O heart valve replacement with mechanical valve - Coumadin initially held for cardiology eval/ possible pacer placement - subsequently started on Heparin and then switched to Lovenox as INR still subtherapeutic - initially, the patient was not wanting to do Lovenox injections at home - she has changed her mind and is willing to use lovenox until INR is therapeutic- INR today is 2.2- she will go to the coumadin clinic to have INR checked tomorrow and an appt has been made for her- she knows to stop Lovenox when INR is therapeutic     S/P minimally-invasive tricuspid valve repair  Anemia, macrocytic - HB noted to be 12.1 on 7/2 but dropped to ~ 8 by 7/5- no active bleeding noted, FOB was negatiave - anemia panel consistent with B12 deficiency>>B12 level was 176 - has been started on B12 replacement      Discharge Exam: Vitals:   04/04/19 0458 04/04/19 0600  BP: 121/80   Pulse: (!) 114 72  Resp: 18   Temp: 98.1 F (36.7 C)   SpO2: 100%    Vitals:   04/03/19 2150 04/03/19 2210 04/04/19 0458 04/04/19 0600  BP: (!) 175/108 (!) 150/87 121/80   Pulse: (!) 130 90 (!) 114 72  Resp: (!) 22 20 18    Temp: 98.1 F (36.7 C)  98.1 F (36.7 C)   TempSrc: Oral  Oral   SpO2: 100%  100%   Weight:    56.2 kg  Height:        General: Pt is alert, awake, not in acute distress Cardiovascular: RRR, S1/S2 +, no rubs, no gallops Respiratory: CTA bilaterally, no wheezing, no rhonchi Abdominal: Soft, NT, ND, bowel sounds + Extremities: no edema, no cyanosis   Discharge Instructions  Discharge Instructions    Diet - low sodium heart healthy   Complete by: As directed    Increase activity slowly   Complete by: As directed      Allergies as of 04/04/2019      Reactions   Oxycodone Itching      Medication List    TAKE these medications   aspirin EC 81 MG tablet Take 1 tablet (81 mg total) by mouth daily. Notes to patient: 04/05/2019   CLEAR EYES  COMPLETE OP Place 2 drops into both eyes daily as needed (red eyes).   cyanocobalamin 1000 MCG tablet Take 1 tablet (1,000 mcg total) by mouth daily. Notes to patient: 04/05/2019   enoxaparin 60 MG/0.6ML injection Commonly known as: LOVENOX Inject 0.6 mLs (60 mg total) into the skin every 12 (twelve) hours. Notes to patient: 04/04/2019   fluticasone 50 MCG/ACT nasal spray Commonly known as: FLONASE Place 1 spray into both nostrils daily as needed for allergies or rhinitis.   saccharomyces boulardii 250 MG capsule Commonly known as: FLORASTOR Take 1 capsule (250 mg total) by mouth 2 (two) times daily.   warfarin 5 MG tablet Commonly known as: COUMADIN Take as directed. If you are unsure how to take this medication, talk to your nurse or doctor. Original instructions: Take as directed by coumadin clinic What changed:   how much to take  how to take this  when to take this  additional instructions      Follow-up Information    Fulp, Cammie, MD In 2 days.   Specialty: Family Medicine Why: As needed Contact information: [place] [address] [phone]        Evans Lance, MD Follow up.   Specialty: Cardiology Why: 04/09/2019 @ 8:30AM, for heart monitor implant Contact information: [address] [address] [phone]        [place] Office Follow up on 04/05/2019.   Specialty: Cardiology Why: at 1:30 pm  for coumadin check  Contact information: [address], [address] [place]       Richardson Dopp T, PA-C Follow up on 04/16/2019.   Specialties: Cardiology, Physician Assistant Why: at 8:15 AM for cardiac eval,  with Dr. Kingsley Plan PA- Richardson Dopp Contact information: [address] [place] [address] [phone]          Allergies  Allergen Reactions  . Oxycodone Itching     Procedures/Studies:    Ct Head Wo  Contrast  Result Date: 03/28/2019 CLINICAL DATA:  Headache.  Fall. EXAM: CT HEAD WITHOUT CONTRAST CT CERVICAL SPINE WITHOUT CONTRAST TECHNIQUE: Multidetector CT imaging of the head and cervical spine was performed following the standard protocol without intravenous contrast. Multiplanar CT image reconstructions of the cervical spine were also generated. COMPARISON:  12/19/2016 FINDINGS: CT HEAD FINDINGS Brain: No acute intracranial abnormality. Specifically, no hemorrhage, hydrocephalus, mass lesion, acute infarction, or significant intracranial injury. Vascular: No hyperdense vessel or unexpected calcification. Skull: No acute calvarial abnormality. Sinuses/Orbits: Visualized paranasal sinuses and mastoids clear. Orbital soft tissues unremarkable. Other: None CT CERVICAL SPINE FINDINGS Alignment: Normal Skull base and vertebrae: No acute fracture. No  primary bone lesion or focal pathologic process. Soft tissues and spinal canal: No prevertebral fluid or swelling. No visible canal hematoma. Disc levels:  Normal Upper chest: Normal Other: None IMPRESSION: No acute intracranial abnormality. No acute bony abnormality in the cervical spine. Electronically Signed   By: Rolm Baptise M.D.   On: 03/28/2019 00:19   Ct Cervical Spine Wo Contrast  Result Date: 03/28/2019 CLINICAL DATA:  Headache.  Fall. EXAM: CT HEAD WITHOUT CONTRAST CT CERVICAL SPINE WITHOUT CONTRAST TECHNIQUE: Multidetector CT imaging of the head and cervical spine was performed following the standard protocol without intravenous contrast. Multiplanar CT image reconstructions of the cervical spine were also generated. COMPARISON:  12/19/2016 FINDINGS: CT HEAD FINDINGS Brain: No acute intracranial abnormality. Specifically, no hemorrhage, hydrocephalus, mass lesion, acute infarction, or significant intracranial injury. Vascular: No hyperdense vessel or unexpected calcification. Skull: No acute calvarial abnormality. Sinuses/Orbits: Visualized paranasal  sinuses and mastoids clear. Orbital soft tissues unremarkable. Other: None CT CERVICAL SPINE FINDINGS Alignment: Normal Skull base and vertebrae: No acute fracture. No primary bone lesion or focal pathologic process. Soft tissues and spinal canal: No prevertebral fluid or swelling. No visible canal hematoma. Disc levels:  Normal Upper chest: Normal Other: None IMPRESSION: No acute intracranial abnormality. No acute bony abnormality in the cervical spine. Electronically Signed   By: Rolm Baptise M.D.   On: 03/28/2019 00:19   Dg Chest Port 1 View  Result Date: 03/28/2019 CLINICAL DATA:  Cough EXAM: PORTABLE CHEST 1 VIEW COMPARISON:  05/24/2018 FINDINGS: No focal opacity or pleural effusion. Stable cardiomediastinal silhouette. Valve prostheses. No pneumothorax. IMPRESSION: No active disease. Electronically Signed   By: Donavan Foil M.D.   On: 03/28/2019 00:40     The results of significant diagnostics from this hospitalization (including imaging, microbiology, ancillary and laboratory) are listed below for reference.     Microbiology: Recent Results (from the past 240 hour(s))  SARS Coronavirus 2 (CEPHEID - Performed in [place] hospital lab), Hosp Order     Status: None   Collection Time: 03/28/19  4:41 AM   Specimen: Nasopharyngeal Swab  Result Value Ref Range Status   SARS Coronavirus 2 NEGATIVE NEGATIVE Final    Comment: (NOTE) If result is NEGATIVE SARS-CoV-2 target nucleic acids are NOT DETECTED. The SARS-CoV-2 RNA is generally detectable in upper and lower  respiratory specimens during the acute phase of infection. The lowest  concentration of SARS-CoV-2 viral copies this assay can detect is 250  copies / mL. A negative result does not preclude SARS-CoV-2 infection  and should not be used as the sole basis for treatment or other  patient management decisions.  A negative result may occur with  improper specimen collection / handling, submission of specimen other  than  nasopharyngeal swab, presence of viral mutation(s) within the  areas targeted by this assay, and inadequate number of viral copies  (<250 copies / mL). A negative result must be combined with clinical  observations, patient history, and epidemiological information. If result is POSITIVE SARS-CoV-2 target nucleic acids are DETECTED. The SARS-CoV-2 RNA is generally detectable in upper and lower  respiratory specimens dur ing the acute phase of infection.  Positive  results are indicative of active infection with SARS-CoV-2.  Clinical  correlation with patient history and other diagnostic information is  necessary to determine patient infection status.  Positive results do  not rule out bacterial infection or co-infection with other viruses. If result is PRESUMPTIVE POSTIVE SARS-CoV-2 nucleic acids MAY BE PRESENT.   A  presumptive positive result was obtained on the submitted specimen  and confirmed on repeat testing.  While 2019 novel coronavirus  (SARS-CoV-2) nucleic acids may be present in the submitted sample  additional confirmatory testing may be necessary for epidemiological  and / or clinical management purposes  to differentiate between  SARS-CoV-2 and other Sarbecovirus currently known to infect humans.  If clinically indicated additional testing with an alternate test  methodology [phone]) is advised. The SARS-CoV-2 RNA is generally  detectable in upper and lower respiratory sp ecimens during the acute  phase of infection. The expected result is Negative. Fact Sheet for Patients:  StrictlyIdeas.no Fact Sheet for Healthcare Providers: BankingDealers.co.za This test is not yet approved or cleared by the [place] FDA and has been authorized for detection and/or diagnosis of SARS-CoV-2 by FDA under an Emergency Use Authorization (EUA).  This EUA will remain in effect (meaning this test can be used) for the duration of  the COVID-19 declaration under Section 564(b)(1) of the Act, 21 U.S.C. section 360bbb-3(b)(1), unless the authorization is terminated or revoked sooner. Performed at [facility], [place] [address], [place], [place] [address]      Labs: BNP (last 3 results) No results for input(s): BNP in the last 8760 hours. Basic Metabolic Panel: Recent Labs  Lab 03/29/19 0349 03/30/19 0402  NA 134* 134*  K 4.4 3.9  CL 104 104  CO2 23 21*  GLUCOSE 80 84  BUN 9 14  CREATININE 0.82 0.85  CALCIUM 8.4* 8.3*  MG  --  1.7   Liver Function Tests: No results for input(s): AST, ALT, ALKPHOS, BILITOT, PROT, ALBUMIN in the last 168 hours. No results for input(s): LIPASE, AMYLASE in the last 168 hours. No results for input(s): AMMONIA in the last 168 hours. CBC: Recent Labs  Lab 03/31/19 0423 04/01/19 0433 04/02/19 0421 04/03/19 0440 04/04/19 0434  WBC 7.7 6.9 6.4 6.4 6.2  HGB 8.7* 8.3* 8.0* 8.1* 7.8*  HCT 26.1* 25.7* 25.3* 25.0* 24.4*  MCV 113.5* 116.3* 116.6* 117.9* 116.2*  PLT 304 290 256 274 262   Cardiac Enzymes: No results for input(s): CKTOTAL, CKMB, CKMBINDEX, TROPONINI in the last 168 hours. BNP: Invalid input(s): POCBNP CBG: No results for input(s): GLUCAP in the last 168 hours. D-Dimer No results for input(s): DDIMER in the last 72 hours. Hgb A1c No results for input(s): HGBA1C in the last 72 hours. Lipid Profile No results for input(s): CHOL, HDL, LDLCALC, TRIG, CHOLHDL, LDLDIRECT in the last 72 hours. Thyroid function studies No results for input(s): TSH, T4TOTAL, T3FREE, THYROIDAB in the last 72 hours.  Invalid input(s): FREET3 Anemia work up Recent Labs    04/01/19 1218  VITAMINB12 176*  FOLATE 9.3  FERRITIN 183  TIBC 267  IRON 84  RETICCTPCT 2.5   Urinalysis    Component Value Date/Time   COLORURINE YELLOW 03/28/2019 0040   APPEARANCEUR CLEAR 03/28/2019 0040   LABSPEC 1.003 (L) 03/28/2019 0040   PHURINE 6.0 03/28/2019 0040   GLUCOSEU  NEGATIVE 03/28/2019 0040   HGBUR LARGE (A) 03/28/2019 0040   BILIRUBINUR NEGATIVE 03/28/2019 0040   KETONESUR NEGATIVE 03/28/2019 0040   PROTEINUR NEGATIVE 03/28/2019 0040   UROBILINOGEN 0.2 06/04/2015 1533   NITRITE NEGATIVE 03/28/2019 0040   LEUKOCYTESUR NEGATIVE 03/28/2019 0040   Sepsis Labs Invalid input(s): PROCALCITONIN,  WBC,  LACTICIDVEN Microbiology Recent Results (from the past 240 hour(s))  SARS Coronavirus 2 (CEPHEID - Performed in [place] hospital lab), Hosp Order     Status: None  Collection Time: 03/28/19  4:41 AM   Specimen: Nasopharyngeal Swab  Result Value Ref Range Status   SARS Coronavirus 2 NEGATIVE NEGATIVE Final    Comment: (NOTE) If result is NEGATIVE SARS-CoV-2 target nucleic acids are NOT DETECTED. The SARS-CoV-2 RNA is generally detectable in upper and lower  respiratory specimens during the acute phase of infection. The lowest  concentration of SARS-CoV-2 viral copies this assay can detect is 250  copies / mL. A negative result does not preclude SARS-CoV-2 infection  and should not be used as the sole basis for treatment or other  patient management decisions.  A negative result may occur with  improper specimen collection / handling, submission of specimen other  than nasopharyngeal swab, presence of viral mutation(s) within the  areas targeted by this assay, and inadequate number of viral copies  (<250 copies / mL). A negative result must be combined with clinical  observations, patient history, and epidemiological information. If result is POSITIVE SARS-CoV-2 target nucleic acids are DETECTED. The SARS-CoV-2 RNA is generally detectable in upper and lower  respiratory specimens dur ing the acute phase of infection.  Positive  results are indicative of active infection with SARS-CoV-2.  Clinical  correlation with patient history and other diagnostic information is  necessary to determine patient infection status.  Positive results do  not rule  out bacterial infection or co-infection with other viruses. If result is PRESUMPTIVE POSTIVE SARS-CoV-2 nucleic acids MAY BE PRESENT.   A presumptive positive result was obtained on the submitted specimen  and confirmed on repeat testing.  While 2019 novel coronavirus  (SARS-CoV-2) nucleic acids may be present in the submitted sample  additional confirmatory testing may be necessary for epidemiological  and / or clinical management purposes  to differentiate between  SARS-CoV-2 and other Sarbecovirus currently known to infect humans.  If clinically indicated additional testing with an alternate test  methodology [phone]) is advised. The SARS-CoV-2 RNA is generally  detectable in upper and lower respiratory sp ecimens during the acute  phase of infection. The expected result is Negative. Fact Sheet for Patients:  StrictlyIdeas.no Fact Sheet for Healthcare Providers: BankingDealers.co.za This test is not yet approved or cleared by the [place] FDA and has been authorized for detection and/or diagnosis of SARS-CoV-2 by FDA under an Emergency Use Authorization (EUA).  This EUA will remain in effect (meaning this test can be used) for the duration of the COVID-19 declaration under Section 564(b)(1) of the Act, 21 U.S.C. section 360bbb-3(b)(1), unless the authorization is terminated or revoked sooner. Performed at [facility], [place] [address], [place], [place] [address]      Time coordinating discharge in minutes: 52  SIGNED:   Debbe Odea, MD  [facility] 04/04/2019, 10:59 AM Pager   If 7PM-7AM, please contact night-coverage [email or web address] Password [email or web address]

## 2019-04-05 ENCOUNTER — Other Ambulatory Visit: Payer: Self-pay

## 2019-04-05 ENCOUNTER — Ambulatory Visit (INDEPENDENT_AMBULATORY_CARE_PROVIDER_SITE_OTHER): Payer: Self-pay | Admitting: *Deleted

## 2019-04-05 DIAGNOSIS — Z7901 Long term (current) use of anticoagulants: Secondary | ICD-10-CM

## 2019-04-05 LAB — POCT INR: INR: 4.1 — AB (ref 2.0–3.0)

## 2019-04-05 NOTE — Patient Instructions (Addendum)
Description   Do not take any Coumadin today and take 1/2 tablet tomorrow then continue taking 1 tablet everyday except 1.5 tablet on Mondays, Wednesdays and Fridays. Recheck INR on Tuesdays. Continue eating the same amount of leafy green vegetables.  Main # [phone]

## 2019-04-08 ENCOUNTER — Telehealth: Payer: Self-pay | Admitting: Internal Medicine

## 2019-04-08 NOTE — Telephone Encounter (Signed)
New Message ° ° ° °Left message to confirm appt and answer covid questions  °

## 2019-04-09 ENCOUNTER — Telehealth: Payer: Self-pay

## 2019-04-09 ENCOUNTER — Ambulatory Visit: Payer: Medicaid Other | Admitting: Internal Medicine

## 2019-04-09 ENCOUNTER — Telehealth: Payer: Self-pay | Admitting: *Deleted

## 2019-04-09 NOTE — Telephone Encounter (Signed)
Call placed to Pt.  Pt has medicaid family planning which is not accepted by our office.  Unsure if Pt loop placement has been sent to precert dept to see if it would be covered.  Need to speak with Bridgette Habermann to see if Pt has started process for orange card.  Left message for Pt advising it would be best to delay implant until we could determine if we could get aid for her to pay for the implant and monthly fees.  Advised Pt to call office when she gets my message.

## 2019-04-09 NOTE — Telephone Encounter (Signed)
Called pt because she missed her appt this morning, she stated she received a call from the Cardiology Nurse and they canceled that appt then she did not have a ride so she did not come this morning. She stated she will have a ride on Friday and she could come in. Advised her that she really needs her Mary [name] checked since while in the hospital gave her large doses and discharged her and her INR went from 2.2 to 4.1 in a day. She verbalized understanding and stated she would come to appt that was made.

## 2019-04-11 ENCOUNTER — Telehealth (HOSPITAL_COMMUNITY): Payer: Self-pay | Admitting: Licensed Clinical Social Worker

## 2019-04-11 NOTE — Telephone Encounter (Signed)
CSW received consult to assist with transportation to coumadin appt tomorrow.  CSW called pt and confirmed address and time of appt.  CSW to call for taxi to take patient to appt in the morning.  Pt then asked CSW about paying for procedure she needs but was told she didn't have insurance coverage that would cover it.  Pt currently with [facility] which only covers birth control.  CSW spoke with pt regarding [employer] which would pay for [facility] procedures and sent her an application in the mail.  Also Garment/textile technologist at [employer] where patient goes to see if they could assist with this process.  CSW will continue to follow and assist as needed  Jorge Ny, [facility] Desk#: [phone] Cell#: [phone]

## 2019-04-12 ENCOUNTER — Other Ambulatory Visit: Payer: Self-pay

## 2019-04-12 ENCOUNTER — Telehealth: Payer: Self-pay | Admitting: *Deleted

## 2019-04-12 ENCOUNTER — Ambulatory Visit (INDEPENDENT_AMBULATORY_CARE_PROVIDER_SITE_OTHER): Payer: Self-pay | Admitting: *Deleted

## 2019-04-12 ENCOUNTER — Telehealth (HOSPITAL_COMMUNITY): Payer: Self-pay | Admitting: Licensed Clinical Social Worker

## 2019-04-12 DIAGNOSIS — Z7901 Long term (current) use of anticoagulants: Secondary | ICD-10-CM

## 2019-04-12 LAB — POCT INR: INR: 3.3 — AB (ref 2.0–3.0)

## 2019-04-12 MED ORDER — WARFARIN SODIUM 5 MG PO TABS: ORAL_TABLET | ORAL | 0 refills | Status: DC

## 2019-04-12 NOTE — Telephone Encounter (Signed)
CSW called cab for patient to pick her up at 8:30 to take to 9am coumadin appt as was agreed upon with patient yesterday afternoon.   Missed call from patient at 8:20am but no voicemail left.  CSW received call from cab company stating they were at her home and the patient wasn't coming out so they canceled the ride.  CSW attempted to call patient multiple times with no answer starting at 8:40am.  Called the coumadin clinic who confirmed that patient had made it for her appointment by other means.  [employer] gift card present at clinic to help pt pay for copay costs for medications.  Jorge Ny, LCSW Clinical Social Worker [facility] Desk#: [phone] Cell#: [phone]

## 2019-04-12 NOTE — Patient Instructions (Addendum)
Description    Continue taking 1 tablet everyday except 1.5 tablet on Mondays, Wednesdays and Fridays. Recheck INR in 3 weeks. Continue eating the same amount of leafy green vegetables.  Main # [phone]

## 2019-04-12 NOTE — Telephone Encounter (Signed)
Lvm of later time to arrive for appointment

## 2019-04-15 ENCOUNTER — Telehealth: Payer: Self-pay | Admitting: Physician Assistant

## 2019-04-15 ENCOUNTER — Telehealth (HOSPITAL_COMMUNITY): Payer: Self-pay | Admitting: Licensed Clinical Social Worker

## 2019-04-15 NOTE — Telephone Encounter (Signed)
CSW called pt to follow up regarding financial concerns for procedure.  Confirmed with patient that she needs to complete [employer] application- informed she needs to call [facility] and set up appt with financial counselor to discuss completed application and have it submitted- pt expressed understanding  Pt also requested help with cab to appt tomorrow with cardiologist- CSW to arrange tomorrow morning  CSW will continue to follow and assist as needed  Jorge Ny, [facility] Desk#: [phone] Cell#: [phone]

## 2019-04-15 NOTE — Progress Notes (Signed)
Cardiology Office Note:    Date:  04/16/2019   ID:  CANDELA KRUL, DOB 1961/03/28, MRN [id number]  PCP:  Antony Blackbird, MD  Cardiologist:  Candee Furbish, MD  Electrophysiologist:  Cristopher Peru, MD   Referring MD: Antony Blackbird, MD   Chief Complaint  Patient presents with  . Hospitalization Follow-up    admx w/ syncope    History of Present Illness:    Mary [name] is a 57 y.o. female with:  Severe MR (in setting of SBE) June 2014  S/p R mini-thoracotomy with mechanical MVR and TV repair 6/14  Post op c/b CHB >> resolved; no pacemaker needed  Echocardiogram 02/2019: normal EF, normally functioning MVR and TV repair  Transient Complete Heart Block  Holter 03/2014: asymptomatic CHB during sleep >> seen by EP Lovena Le); no PPM needed  Holter 7/19: normal sinus rhythm, intermittent CHB during daytime >> seen by EP; PPM recommended; patient never called back to arrange  S/p TIA in 05/2016 (sub therapeutic INR)  Sinus tachycardia  Beta blocker tried in 5/17 but stopped due to high grade heart block >> seen by EP; no PPM needed  Cardiac Catheterization 2014:  No CAD  Macrocytic anemia 2/2 B12 deficiency   Mary [name] was previously followed by Dr. Aundra Dubin.  She was transitioned to Dr. Saunders Revel after Dr. Aundra Dubin switched to the [facility].  She has had issues with transient, asymptomatic complete heart block that occurred during sleep since her valve surgery.  She was not felt to require pacemaker implantation.  However, in 03/2018, she was felt to be symptomatic and had intermittent CHB during the day.  She was seen by EP and PPM was recommended.  However, she never called back to schedule this.  She was set up to see Dr. Lovena Le in May 2020 to discuss this again.  However, she complained of shortness of breath and it was difficult to assess volume status.  She was therefore seen by Dr. Marlou Porch 02/12/2019.  A follow up echocardiogram showed normal EF and normally functioning mechanical MVR and  TV repair.    The patient was then admitted 7/1-7/9 with a syncopal episode preceded by palpitations.  She was seen by Dr. Lovena Le regarding pacemaker implant vs ILR.  Dr. Lovena Le favored ILR.  The patient had no high grade heart block on tele and was set up for an outpatient ILR implant.  However, there have been some issues with insurance coverage noted in the chart.  She has not had an ILR implanted yet.  She returns for follow up.  She is here alone.  She has not had further syncope.  She has not had chest pain.  She notes chronic dyspnea with exertion.  She has been sleeping on 4 pillows since her mitral valve surgery.  She notes a chronic, nonproductive cough over the past several years.  She is an ex-smoker.  She has not had any melena or hematochezia.  Prior CV studies:   The following studies were reviewed today:  Echocardiogram 03/06/2019 EF > 65, no RWMA, s/p mechanical MVR with normal function, s/p TV repair with trivial TR  Holter 03/30/18  Normal sinus rhythm and sinus tachycardia with average heart rate 109 bpm. Heart rate ranged from 27 to 171 bpm.  Intermittent complete heart block with slow ventricular response at 47 bpm during the daytime.  SVT at 171 bpm  Occasional PVCs and wide-complex tachycardia up to 5 beats  Echocardiogram 02/06/18 EF 55-60, no RWMA, normally functioning mechanical  MVR, TV repair functioning normally  Event Monitor 02/24/16 1 episode of high grade heart block at night. Otherwise, mild sinus tachycardia.   Carotid US 9/17 Bilateral: soft plaque throughout CCA, ICA and ECA. 1-39% ICA plaquing. Vertebral artery flow is antegrade.  Echo 06/06/16 EF 55-60%, mechanical MVR functioning normally, mild RAE, TV repair functioning normally  Echo 11/03/15 Posterior wall mild LVH, EF 55-60%, normal wall motion, mechanical MVR okay, normal RVSF  Echo 01/01/15 Mild focal basal septal hypertrophy, EF 60-65%, normal wall motion, S/P MVR (St Jude mechanical  valve) functioning well, mild MR (small mobile echodensity-likely redundant chord versus suture), mild LAE, TV mean gradient 4 mmHg.   Echo 7/15 Mild LVH, EF 60-65%, normal wall motion, grade 1 diastolic dysfunction, S/P MVR (St Jude mechanical valve) and appears functioning well, mild MR, no perivalvular leak, mild LAE, mildly reduced RVSF.   Carotid US 8/14 Bilateral - 1% to 39% ICA stenosis   Cardiac cath 6/14 Left main: No obstructive disease.  Left Anterior Descending Artery: No obstructive disease noted.  Circumflex Artery: No obstructive disease.  Right Coronary Artery: no obstructive disease.  Left Ventricular Angiogram: LVEF 65-70%. 3+ MR Distal Aortogram: No aneurysm. No stenosis.   Past Medical History:  Diagnosis Date  . Acute diastolic heart failure ([place]) 03/15/2013  . Anemia   . Carotid stenosis    Carotid US 2/17: bilateral ICA 1-39% >> FU prn  . Childhood asthma   . Complete heart block ([place]) 05/28/2013   Post-op  . Epigastric hernia 200's  . GERD (gastroesophageal reflux disease)   . Heart murmur   . History of blood transfusion    "14 w/1st pregnancy; 2 w/last C-section" (03/15/2013)  . Hx of echocardiogram    a. Echo 4/16:  Mild focal basal septal hypertrophy, EF 60-65%, no RWMA, Mechanical MVR ok with mild central regurgitation and no perivalvular leak, small mobile density attached to valvular ring (1.5x1 cm) - post surgical changes vs SBE, mild LAE;   b. Echo 2/17:  Mild post wall LVH, EF 55-60%, no RWMA, mechanical MVR ok (mean 5 mmHg), normal RVSF  . Hypertension    no pcp   will go to [facility]  . 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 S/P mitral valve replacement with metallic valve 1/61/0960   39mm Sorin [employer]omedics mechanical prosthesis via right mini thoracotomy approach  . 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   Complex valvuloplasty including Cor-matrix ECM patch augmentation of anterior and lateral leaflets with 57mm Edwards mc3  ring annuloplasty via right mini-thoracotomy approach  . Severe mitral regurgitation 04/22/2013  . Stroke ([place])   . SVT (supraventricular tachycardia) ([place]) 03/16/2013  . Tricuspid regurgitation    Surgical Hx: The patient  has a past surgical history that includes Appendectomy (~1978); Cesarean section (1983); Laparoscopic cholecystectomy (2003); Cesarean section with bilateral tubal ligation (1999); TEE without cardioversion (N/A, 03/18/2013); Multiple extractions with alveoloplasty (N/A, 04/04/2013); Minimally invasive tricuspid valve repair (Right, 05/23/2013); Intraoprative transesophageal echocardiogram (N/A, 05/23/2013); Femoral hernia repair (Right, 05/23/2013); Mitral valve replacement (N/A, 05/23/2013); TEE without cardioversion (N/A, 06/17/2013); left and right heart catheterization with coronary angiogram (N/A, 03/22/2013); Cardiac valve replacement; Tubal ligation (1999); and Cardiac catheterization.   Current Medications: Current Meds  Medication Sig  . aspirin EC 81 MG tablet Take 1 tablet (81 mg total) by mouth daily.  . fluticasone (FLONASE) 50 MCG/ACT nasal spray Place 1 spray into both nostrils daily as needed for allergies or rhinitis.  . Hyprom-Naphaz-Polysorb-Zn Sulf (CLEAR EYES COMPLETE OP)  Place 2 drops into both eyes daily as needed (red eyes).  . saccharomyces boulardii (FLORASTOR) 250 MG capsule Take 1 capsule (250 mg total) by mouth 2 (two) times daily.  . vitamin B-12 1000 MCG tablet Take 1 tablet (1,000 mcg total) by mouth daily.  [name] warfarin (COUMADIN) 5 MG tablet Take as directed by coumadin clinic     Allergies:   Oxycodone   Social History   Tobacco Use  . Smoking status: Former Smoker    Packs/day: 0.50    Years: 36.00    Pack years: 18.00    Types: Cigarettes    Quit date: 03/15/2013    Years since quitting: 6.0  . Smokeless tobacco: Never Used  Substance Use Topics  . Alcohol use: Yes    Comment: 01/11/2017  "glass of wine/month"  . Drug use: No      Family Hx: The patient's family history includes Cancer in her mother; Sarcoidosis in her sister; Stroke in her father. There is no history of Heart attack.  ROS:   Please see the history of present illness.    ROS All other systems reviewed and are negative.   EKGs/Labs/Other Test Reviewed:    EKG:  EKG is not ordered today.  The ekg ordered today demonstrates n/a  Recent Labs: 03/28/2019: ALT 17; TSH 2.481 03/30/2019: BUN 14; Creatinine, Ser 0.85; Magnesium 1.7; Potassium 3.9; Sodium 134 04/04/2019: Hemoglobin 7.8; Platelets 262   Recent Lipid Panel Lab Results  Component Value Date/Time   CHOL 189 05/31/2018 11:08 AM   TRIG 300 (H) 05/31/2018 11:08 AM   HDL 68 05/31/2018 11:08 AM   CHOLHDL 2.8 05/31/2018 11:08 AM   CHOLHDL 3.2 06/06/2016 01:35 AM   LDLCALC 61 05/31/2018 11:08 AM    Physical Exam:    VS:  BP 100/70   Pulse (!) 121   Ht 5\' 2"  (1.575 m)   Wt 120 lb (54.4 kg)   LMP 03/22/2013   SpO2 98%   BMI 21.95 kg/m     Wt Readings from Last 3 Encounters:  04/16/19 120 lb (54.4 kg)  04/04/19 123 lb 14.4 oz (56.2 kg)  02/12/19 125 lb 3.2 oz (56.8 kg)     Physical Exam  Constitutional: She is oriented to person, place, and time. She appears well-developed and well-nourished. No distress.  HENT:  Head: Normocephalic and atraumatic.  Eyes: No scleral icterus.  Neck: No JVD present. No thyromegaly present.  Cardiovascular: Regular rhythm and normal heart sounds. Tachycardia present.  No murmur heard. Mechanical S1, normal S2  Pulmonary/Chest: Effort normal and breath sounds normal. She has no rales.  Abdominal: Soft. There is no hepatomegaly.  Musculoskeletal:        General: No edema.  Lymphadenopathy:    She has no cervical adenopathy.  Neurological: She is alert and oriented to person, place, and time.  Skin: Skin is warm and dry.  Psychiatric: She has a normal mood and affect.    ASSESSMENT & PLAN:    1. Syncope, unspecified syncope type Etiology not  entirely clear.  She does have a history of intermittent complete heart block.  Loop recorder implantation has been recommended.  She does not drive.  We briefly discussed with, all which restricts driving for 6 months after a syncopal episode.  2. Intermittent complete heart block (HCC) As noted, pacemaker implantation has been recommended in the past.  However, currently, loop recorder implantation has been recommended.  Her insurance will not cover this and social work has  been working with her to help obtain coverage.  She needs to complete paperwork at [facility].  I have asked for Dr. Tanna Furry nurse to follow up with her as directed.    3. Sinus tachycardia Likely secondary to anemia.    4. S/P mitral valve replacement with metallic valve 5. S/P minimally-invasive tricuspid valve repair Recent echocardiogram with normally functioning MVR, TV repair.  Her EF was normal.  I suspect her shortness of breath is related to her anemia.  She has no evidence of volume excess on exam.  She does have a chronic cough and is an ex-smoker.  She needs follow up with primary care for management as she likely has COPD.  Continue ASA, Coumadin.  Continue SBE prophylaxis.    6. Macrocytic anemia with vitamin B12 deficiency Hgb recently < 8.  She needs follow up with primary care for management.  Our clinic will try to arrange this.     Dispo:  Return in about 6 months (around 10/17/2019) for Routine Follow Up w/ Dr. Marlou Porch, or Richardson Dopp, PA-C.   Medication Adjustments/Labs and Tests Ordered: Current medicines are reviewed at length with the patient today.  Concerns regarding medicines are outlined above.  Tests Ordered: No orders of the defined types were placed in this encounter.  Medication Changes: No orders of the defined types were placed in this encounter.   Signed, Richardson Dopp, PA-C  04/16/2019 9:33 AM    [facility] [address], [place], [place]  [address] Phone: [phone]; Fax: [phone]

## 2019-04-15 NOTE — Telephone Encounter (Signed)
New Message ° ° ° °Left message to confirm appt and answer covid questions  °

## 2019-04-16 ENCOUNTER — Encounter: Payer: Self-pay | Admitting: Physician Assistant

## 2019-04-16 ENCOUNTER — Telehealth (HOSPITAL_COMMUNITY): Payer: Self-pay | Admitting: Licensed Clinical Social Worker

## 2019-04-16 ENCOUNTER — Ambulatory Visit: Payer: Self-pay | Admitting: Physician Assistant

## 2019-04-16 ENCOUNTER — Other Ambulatory Visit: Payer: Self-pay

## 2019-04-16 ENCOUNTER — Ambulatory Visit (INDEPENDENT_AMBULATORY_CARE_PROVIDER_SITE_OTHER): Payer: Self-pay | Admitting: Physician Assistant

## 2019-04-16 VITALS — BP 100/70 | HR 121 | Ht 62.0 in | Wt 120.0 lb

## 2019-04-16 DIAGNOSIS — Z954 Presence of other heart-valve replacement: Secondary | ICD-10-CM

## 2019-04-16 DIAGNOSIS — D518 Other vitamin B12 deficiency anemias: Secondary | ICD-10-CM

## 2019-04-16 DIAGNOSIS — D519 Vitamin B12 deficiency anemia, unspecified: Secondary | ICD-10-CM

## 2019-04-16 DIAGNOSIS — R000 Tachycardia, unspecified: Secondary | ICD-10-CM

## 2019-04-16 DIAGNOSIS — R55 Syncope and collapse: Secondary | ICD-10-CM

## 2019-04-16 DIAGNOSIS — I442 Atrioventricular block, complete: Secondary | ICD-10-CM

## 2019-04-16 DIAGNOSIS — Z9889 Other specified postprocedural states: Secondary | ICD-10-CM

## 2019-04-16 NOTE — Telephone Encounter (Signed)
CSW called for taxi to take patient to [address] appt as she was unable to get a ride.  CSW will continue to follow and assist as needed  Jorge Ny, [facility] Desk#: [phone] Cell#: [phone]

## 2019-04-16 NOTE — Patient Instructions (Signed)
Medication Instructions:  Your physician recommends that you continue on your current medications as directed. Please refer to the Current Medication list given to you today.   If you need a refill on your cardiac medications before your next appointment, please call your pharmacy.   Lab work: NONE ORDERED  TODAY   If you have labs (blood work) drawn today and your tests are completely normal, you will receive your results only by: . MyChart Message (if you have MyChart) OR . A paper copy in the mail If you have any lab test that is abnormal or we need to change your treatment, we will call you to review the results.  Testing/Procedures: NONE ORDERED  TODAY  Follow-Up:   AS SCHEDULED   Any Other Special Instructions Will Be Listed Below (If Applicable).     

## 2019-05-03 ENCOUNTER — Telehealth (HOSPITAL_COMMUNITY): Payer: Self-pay | Admitting: Licensed Clinical Social Worker

## 2019-05-03 ENCOUNTER — Other Ambulatory Visit: Payer: Self-pay

## 2019-05-03 ENCOUNTER — Ambulatory Visit (INDEPENDENT_AMBULATORY_CARE_PROVIDER_SITE_OTHER): Payer: Self-pay | Admitting: *Deleted

## 2019-05-03 DIAGNOSIS — Z7901 Long term (current) use of anticoagulants: Secondary | ICD-10-CM

## 2019-05-03 LAB — POCT INR: INR: 2.9 (ref 2.0–3.0)

## 2019-05-03 NOTE — Patient Instructions (Addendum)
Description   Continue taking 1 tablet everyday except 1.5 tablets on Mondays, Wednesdays and Fridays. Recheck INR in 4 weeks. Continue eating the same amount of leafy green vegetables.  Coumadin Clinic # [phone]

## 2019-05-03 NOTE — Telephone Encounter (Signed)
Pt had called CSW earlier this week to request taxi to her Coumadin appt this morning.  CSW called pt to confirm if she still needed a ride and was informed she had found a friend who could take her to her appt and she no longer required a taxi- no further needs at this time  CSW will continue to follow and assist as needed  Jorge Ny, [facility] Desk#: [phone] Cell#: [phone]

## 2019-05-15 ENCOUNTER — Telehealth (HOSPITAL_COMMUNITY): Payer: Self-pay | Admitting: Licensed Clinical Social Worker

## 2019-05-15 NOTE — Telephone Encounter (Signed)
CSW called pt to check in regarding status of [employer] application that [place] sent her on 7/16.  Per patient she has not been feeling well and has not gone to her mailbox since that time.  CSW reminded patient that she is in need of a procedure and that the [employer] would be important to paying for her [facility] bills so it is important to complete the application to see if she would qualify.    Pt expressed understanding and states she will look for the application tomorrow  CSW will continue to follow and assist as needed  Jorge Ny, [place] Desk#: [phone] Cell#: [phone]

## 2019-05-23 ENCOUNTER — Telehealth (HOSPITAL_COMMUNITY): Payer: Self-pay | Admitting: Licensed Clinical Social Worker

## 2019-05-23 NOTE — Telephone Encounter (Signed)
CSW calling pt to check in regarding [facility] application- unable to reach pt- VM left requesting return call  Jorge Ny, [facility] Desk#: [phone] Cell#: [phone]

## 2019-06-25 ENCOUNTER — Other Ambulatory Visit: Payer: Self-pay | Admitting: Cardiology

## 2019-06-25 ENCOUNTER — Telehealth (HOSPITAL_COMMUNITY): Payer: Self-pay | Admitting: Licensed Clinical Social Worker

## 2019-06-25 NOTE — Telephone Encounter (Signed)
CSW received phone call from pt who has transportation concerns about getting to coumadin appt tomorrow.  CSW able to get pt taxi for tomorrows appt.  Pt also states that she needs assistance with coumadin copays as she can't afford to get refill at this time.  CSW able to confirm that they can provide [employer] giftcard at her appt tomorrow to assist with her copay cost.  CSW will continue to follow and assist as needed  Jorge Ny, [facility] Desk#: [phone] Cell#: [phone]

## 2019-06-25 NOTE — Telephone Encounter (Signed)
Will refill at patients appointment tomorrow

## 2019-06-25 NOTE — Telephone Encounter (Signed)
New Message   *STAT* If patient is at the pharmacy, call can be transferred to refill team.   1. Which medications need to be refilled? (please list name of each medication and dose if known) warfarin (COUMADIN) 5 MG tablet  2. Which pharmacy/location (including street and city if local pharmacy) is medication to be sent to? [place] ([place]), [place] - [address]  3. Do they need a 30 day or 90 day supply? 30 day

## 2019-06-26 ENCOUNTER — Ambulatory Visit (INDEPENDENT_AMBULATORY_CARE_PROVIDER_SITE_OTHER): Payer: Self-pay | Admitting: *Deleted

## 2019-06-26 ENCOUNTER — Other Ambulatory Visit: Payer: Self-pay

## 2019-06-26 ENCOUNTER — Telehealth (HOSPITAL_COMMUNITY): Payer: Self-pay | Admitting: Licensed Clinical Social Worker

## 2019-06-26 DIAGNOSIS — Z7901 Long term (current) use of anticoagulants: Secondary | ICD-10-CM

## 2019-06-26 LAB — POCT INR: INR: 1.5 — AB (ref 2.0–3.0)

## 2019-06-26 MED ORDER — WARFARIN SODIUM 5 MG PO TABS: ORAL_TABLET | ORAL | 0 refills | Status: DC

## 2019-06-26 NOTE — Telephone Encounter (Signed)
CSW confirmed with pt that she still needs a ride to coumadin appt today.  Set up taxi for pick up.    CSW will continue to follow and assist as needed  Jorge Ny, [facility] Desk#: [phone] Cell#: [phone]

## 2019-06-26 NOTE — Patient Instructions (Signed)
Description   Today and tomorrow take 2 tablets then continue taking 1 tablet everyday except 1.5 tablets on Mondays, Wednesdays and Fridays. Recheck INR in 1 week. Continue eating the same amount of leafy green vegetables.  Coumadin Clinic # [phone]

## 2019-07-05 ENCOUNTER — Ambulatory Visit (INDEPENDENT_AMBULATORY_CARE_PROVIDER_SITE_OTHER): Payer: Self-pay

## 2019-07-05 ENCOUNTER — Other Ambulatory Visit: Payer: Self-pay

## 2019-07-05 DIAGNOSIS — Z7901 Long term (current) use of anticoagulants: Secondary | ICD-10-CM

## 2019-07-05 LAB — POCT INR: INR: 2.8 (ref 2.0–3.0)

## 2019-07-05 NOTE — Patient Instructions (Signed)
Description   Continue on same dosage 1 tablet everyday except 1.5 tablets on Mondays, Wednesdays and Fridays. Recheck INR in 2-3 weeks. Continue eating the same amount of leafy green vegetables.  Coumadin Clinic # [phone]

## 2019-07-24 ENCOUNTER — Telehealth: Payer: Self-pay

## 2019-07-24 NOTE — Telephone Encounter (Signed)
lmom for overdue inr 

## 2019-08-09 ENCOUNTER — Other Ambulatory Visit: Payer: Self-pay | Admitting: Cardiology

## 2019-08-09 MED ORDER — WARFARIN SODIUM 5 MG PO TABS: ORAL_TABLET | ORAL | 0 refills | Status: DC

## 2019-08-09 NOTE — Telephone Encounter (Signed)
Pt overdue for INR check, she did reschedule appt for 11/17. Will send in 1 month supply per pt request with no refills until pt keeps f/u appt.

## 2019-08-09 NOTE — Telephone Encounter (Signed)
°*  STAT* If patient is at the pharmacy, call can be transferred to refill team.   1. Which medications need to be refilled? (please list name of each medication and dose if known) warfarin (COUMADIN) 5 MG tablet  2. Which pharmacy/location (including street and city if local pharmacy) is medication to be sent to? [place] ([place]), [place] - [address]  3. Do they need a 30 day or 90 day supply? 30  Patient has one pill left

## 2019-08-13 NOTE — Progress Notes (Deleted)
Cardiology Office Note:    Date:  08/13/2019   ID:  [name], DOB 01-Aug-1961, MRN [id number]  PCP:  Antony Blackbird, MD  Cardiologist:  Candee Furbish, MD *** Electrophysiologist:  Cristopher Peru, MD   Referring MD: Antony Blackbird, MD   No chief complaint on file. ***  History of Present Illness:    Mary [name] is a 58 y.o. female with:   Severe MR (in setting of SBE) June 2014 ? S/p R mini-thoracotomy with mechanical MVR and TV repair 6/14 ? Post op c/b CHB >> resolved; no pacemaker needed ? Echocardiogram 02/2019: normal EF, normally functioning MVR and TV repair  Transient Complete Heart Block ? Holter 03/2014: asymptomatic CHB during sleep >> seen by EP Lovena Le); no PPM needed ? Holter 7/19: normal sinus rhythm, intermittent CHB during daytime >> seen by EP; PPM recommended; patient never called back to arrange  S/p TIA in 05/2016 (sub therapeutic INR)  Sinus tachycardia ? Beta blocker tried in 5/17 but stopped due to high grade heart block >> seen by EP; no PPM needed  Cardiac Catheterization 2014:  No CAD  Macrocytic anemia 2/2 B12 deficiency  Ms. Lazor was previously followed by Dr. Aundra Dubin.  She was transitioned to Dr. Saunders Revel after Dr. Aundra Dubin switched to the [name].  She has had issues with transient, asymptomatic complete heart block that occurred during sleep since her valve surgery.  She was not felt to require pacemaker implantation.  However, in 03/2018, she was felt to be symptomatic and had intermittent CHB during the day.  She was seen by EP and PPM was recommended.  However, she never called back to schedule this.  FU echocardiogram in 02/2019 demonstrated normal EF and normally functioning mechanical MVR and TV repair.  She was admitted in 03/2019 with syncope.  She was seen by Dr. Lovena Le again regarding PPM vs ILR (Dr. Lovena Le preferred ILR).  However, there were issues noted with insurance coverage.  She was last seen in clinic in July 2020.    The DICTATELATER  SmartLink is not supported in this context. ***   Prior CV studies:   The following studies were reviewed today:  ***  Echocardiogram 03/06/2019 EF > 65, no RWMA, s/p mechanical MVR with normal function, s/p TV repair with trivial TR  Holter 03/30/18  Normal sinus rhythm and sinus tachycardia with average heart rate 109 bpm. Heart rate ranged from 27 to 171 bpm.  Intermittent complete heart block with slow ventricular response at 47 bpm during the daytime.  SVT at 171 bpm  Occasional PVCs and wide-complex tachycardia up to 5 beats  Echocardiogram 02/06/18 EF 55-60, no RWMA, normally functioning mechanical MVR, TV repair functioning normally  Event Monitor 02/24/16 1 episode of high grade heart block at night. Otherwise, mild sinus tachycardia.   Carotid US 9/17 Bilateral: soft plaque throughout CCA, ICA and ECA. 1-39% ICA plaquing. Vertebral artery flow is antegrade.  Echo 06/06/16 EF 55-60%, mechanical MVR functioning normally, mild RAE, TV repair functioning normally  Echo 11/03/15 Posterior wall mild LVH, EF 55-60%, normal wall motion, mechanical MVR okay, normal RVSF  Echo 01/01/15 Mild focal basal septal hypertrophy, EF 60-65%, normal wall motion, S/P MVR (St Jude mechanical valve) functioning well, mild MR (small mobile echodensity-likely redundant chord versus suture), mild LAE, TV mean gradient 4 mmHg.   Echo 7/15 Mild LVH, EF 60-65%, normal wall motion, grade 1 diastolic dysfunction, S/P MVR (St Jude mechanical valve) and appears functioning well, mild MR, no perivalvular  leak, mild LAE, mildly reduced RVSF.   Carotid US 8/14 Bilateral - 1% to 39% ICA stenosis   Cardiac cath 6/14 Left main: No obstructive disease.  Left Anterior Descending Artery: No obstructive disease noted.  Circumflex Artery: No obstructive disease.  Right Coronary Artery: no obstructive disease.  Left Ventricular Angiogram: LVEF 65-70%. 3+ MR Distal Aortogram: No aneurysm. No  stenosis.  Past Medical History:  Diagnosis Date   Acute diastolic heart failure (Mary [name]) 03/15/2013   Anemia    Carotid stenosis    Carotid US 2/17: bilateral ICA 1-39% >> FU prn   Childhood asthma    Complete heart block (Mary [name]) 05/28/2013   Post-op   Epigastric hernia 200's   GERD (gastroesophageal reflux disease)    Heart murmur    History of blood transfusion    "14 w/1st pregnancy; 2 w/last C-section" (03/15/2013)   Hx of echocardiogram    a. Echo 4/16:  Mild focal basal septal hypertrophy, EF 60-65%, no RWMA, Mechanical MVR ok with mild central regurgitation and no perivalvular leak, small mobile density attached to valvular ring (1.5x1 cm) - post surgical changes vs SBE, mild LAE;   b. 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   Complex valvuloplasty including Cor-matrix ECM patch augmentation of anterior and lateral leaflets with 22mm Edwards mc3 ring annuloplasty via right mini-thoracotomy approach   Severe mitral regurgitation 04/22/2013   Stroke Mary [name])    SVT (supraventricular tachycardia) ([place]) 03/16/2013   Tricuspid regurgitation    Surgical Hx: The patient  has a past surgical history that includes Appendectomy (~1978); Cesarean section (1983); Laparoscopic cholecystectomy (2003); Cesarean section with bilateral tubal ligation (1999); TEE without cardioversion (N/A, 03/18/2013); Multiple extractions with alveoloplasty (N/A, 04/04/2013); Minimally invasive tricuspid valve repair (Right, 05/23/2013); Intraoprative transesophageal echocardiogram (N/A, 05/23/2013); Femoral hernia repair (Right, 05/23/2013); Mitral valve replacement (N/A,  05/23/2013); TEE without cardioversion (N/A, 06/17/2013); left and right heart catheterization with coronary angiogram (N/A, 03/22/2013); Cardiac valve replacement; Tubal ligation (1999); and Cardiac catheterization.   Current Medications: No outpatient medications have been marked as taking for the 08/14/19 encounter (Appointment) with Richardson Dopp T, PA-C.     Allergies:   Oxycodone   Social History   Tobacco Use   Smoking status: Former Smoker    Packs/day: 0.50    Years: 36.00    Pack years: 18.00    Types: Cigarettes    Quit date: 03/15/2013    Years since quitting: 6.4   Smokeless tobacco: Never Used  Substance Use Topics   Alcohol use: Yes    Comment: 01/11/2017  "glass of wine/month"   Drug use: No     Family Hx: The patient's family history includes Cancer in her mother; Sarcoidosis in her sister; Stroke in her father. There is no history of Heart attack.  ROS:   Please see the history of present illness.    ROS All other systems reviewed and are negative.   EKGs/Labs/Other Test Reviewed:    EKG:  EKG is *** ordered today.  The ekg ordered today demonstrates ***  Recent Labs: 03/28/2019: ALT 17; TSH 2.481 03/30/2019: BUN 14; Creatinine, Ser 0.85; Magnesium  1.7; Potassium 3.9; Sodium 134 04/04/2019: Hemoglobin 7.8; Platelets 262   Recent Lipid Panel Lab Results  Component Value Date/Time   CHOL 189 05/31/2018 11:08 AM   TRIG 300 (H) 05/31/2018 11:08 AM   HDL 68 05/31/2018 11:08 AM   CHOLHDL 2.8 05/31/2018 11:08 AM   CHOLHDL 3.2 06/06/2016 01:35 AM   LDLCALC 61 05/31/2018 11:08 AM    Physical Exam:    VS:  LMP 03/22/2013     Wt Readings from Last 3 Encounters:  04/16/19 120 lb (54.4 kg)  04/04/19 123 lb 14.4 oz (56.2 kg)  02/12/19 125 lb 3.2 oz (56.8 kg)     ***Physical Exam  ASSESSMENT & PLAN:    *** 1. Syncope, unspecified syncope type Etiology not entirely clear.  She does have a history of intermittent complete heart block.  Loop recorder  implantation has been recommended.  She does not drive.  We briefly discussed with, all which restricts driving for 6 months after a syncopal episode.  2. Intermittent complete heart block (HCC) As noted, pacemaker implantation has been recommended in the past.  However, currently, loop recorder implantation has been recommended.  Her insurance will not cover this and social work has been working with her to help obtain coverage.  She needs to complete paperwork at the [name].  I have asked for Dr. Tanna Furry nurse to follow up with her as directed.    3. Sinus tachycardia Likely secondary to anemia.    4. S/P mitral valve replacement with metallic valve 5. S/P minimally-invasive tricuspid valve repair Recent echocardiogram with normally functioning MVR, TV repair.  Her EF was normal.  I suspect her shortness of breath is related to her anemia.  She has no evidence of volume excess on exam.  She does have a chronic cough and is an ex-smoker.  She needs follow up with primary care for management as she likely has COPD.  Continue ASA, Coumadin.  Continue SBE prophylaxis.    6. Macrocytic anemia with vitamin B12 deficiency Hgb recently < 8.  She needs follow up with primary care for management.  Our clinic will try to arrange this.     Dispo:  No follow-ups on file.   Medication Adjustments/Labs and Tests Ordered: Current medicines are reviewed at length with the patient today.  Concerns regarding medicines are outlined above.  Tests Ordered: No orders of the defined types were placed in this encounter.  Medication Changes: No orders of the defined types were placed in this encounter.   Signed, Richardson Dopp, PA-C  08/13/2019 5:11 PM    [facility] [address], [place], [place]  [address] Phone: [phone]; Fax: [phone]

## 2019-08-14 ENCOUNTER — Ambulatory Visit: Payer: Self-pay | Admitting: Physician Assistant

## 2019-09-04 ENCOUNTER — Telehealth: Payer: Self-pay | Admitting: *Deleted

## 2019-09-04 NOTE — Telephone Encounter (Signed)
Pt called off of recall list.  Pt has agreed with virtual phone call with Mary Dopp, PA-C, 12/11 at 8:15.     Virtual Visit Pre-Appointment Phone Call  "(Name), I am calling you today to discuss your upcoming appointment. We are currently trying to limit exposure to the virus that causes COVID-19 by seeing patients at home rather than in the office."  1. "What is the BEST phone number to call the day of the visit?" - include this in appointment notes  2. "Do you have or have access to (through a family member/friend) a smartphone with video capability that we can use for your visit?" a. If yes - list this number in appt notes as "cell" (if different from BEST phone #) and list the appointment type as a VIDEO visit in appointment notes b. If no - list the appointment type as a PHONE visit in appointment notes  3. Confirm consent - "In the setting of the current Covid19 crisis, you are scheduled for a (phone or video) visit with your provider on (date) at (time).  Just as we do with many in-office visits, in order for you to participate in this visit, we must obtain consent.  If you'd like, I can send this to your mychart (if signed up) or email for you to review.  Otherwise, I can obtain your verbal consent now.  All virtual visits are billed to your insurance company just like a normal visit would be.  By agreeing to a virtual visit, we'd like you to understand that the technology does not allow for your provider to perform an examination, and thus may limit your provider's ability to fully assess your condition. If your provider identifies any concerns that need to be evaluated in person, we will make arrangements to do so.  Finally, though the technology is pretty good, we cannot assure that it will always work on either your or our end, and in the setting of a video visit, we may have to convert it to a phone-only visit.  In either situation, we cannot ensure that we have a secure connection.   Are you willing to proceed?" STAFF: Did the patient verbally acknowledge consent to telehealth visit? Document YES/NO here: YES VERBAL  4. Advise patient to be prepared - "Two hours prior to your appointment, go ahead and check your blood pressure, pulse, oxygen saturation, and your weight (if you have the equipment to check those) and write them all down. When your visit starts, your provider will ask you for this information. If you have an Apple Watch or Kardia device, please plan to have heart rate information ready on the day of your appointment. Please have a pen and paper handy nearby the day of the visit as well."  5. Give patient instructions for MyChart download to smartphone OR Doximity/Doxy.me as below if video visit (depending on what platform provider is using)  6. Inform patient they will receive a phone call 15 minutes prior to their appointment time (may be from unknown caller ID) so they should be prepared to answer    TELEPHONE CALL NOTE  Mary [name] has been deemed a candidate for a follow-up tele-health visit to limit community exposure during the Covid-19 pandemic. I spoke with the patient via phone to ensure availability of phone/video source, confirm preferred email & phone number, and discuss instructions and expectations.  I reminded Mary [name] to be prepared with any vital sign and/or heart rhythm information that could  potentially be obtained via home monitoring, at the time of her visit. I reminded Mary [name] to expect a phone call prior to her visit.  [name], [place] 09/04/2019 4:17 PM   INSTRUCTIONS FOR DOWNLOADING THE MYCHART APP TO SMARTPHONE  - The patient must first make sure to have activated MyChart and know their login information - If Apple, go to [employer] and type in MyChart in the search bar and download the app. If Android, ask patient to go to [employer] and type in [place] in the search bar and download the app. The app is  free but as with any other app downloads, their phone may require them to verify saved payment information or Apple/Android password.  - The patient will need to then log into the app with their MyChart username and password, and select [facility] as their healthcare provider to link the account. When it is time for your visit, go to the MyChart app, find appointments, and click Begin Video Visit. Be sure to Select Allow for your device to access the Microphone and Camera for your visit. You will then be connected, and your provider will be with you shortly.  **If they have any issues connecting, or need assistance please contact MyChart service desk [phone])**  **If using a computer, in order to ensure the best quality for their visit they will need to use either of the following Internet Browsers: [employer], or Google Chrome**  IF USING DOXIMITY or DOXY.ME - The patient will receive a link just prior to their visit by text.     FULL LENGTH CONSENT FOR TELE-HEALTH VISIT   I hereby voluntarily request, consent and authorize [facility] and its employed or contracted physicians, physician assistants, nurse practitioners or other licensed health care professionals (the Practitioner), to provide me with telemedicine health care services (the "Services") as deemed necessary by the treating Practitioner. I acknowledge and consent to receive the Services by the Practitioner via telemedicine. I understand that the telemedicine visit will involve communicating with the Practitioner through live audiovisual communication technology and the disclosure of certain medical information by electronic transmission. I acknowledge that I have been given the opportunity to request an in-person assessment or other available alternative prior to the telemedicine visit and am voluntarily participating in the telemedicine visit.  I understand that I have the right to withhold or withdraw my  consent to the use of telemedicine in the course of my care at any time, without affecting my right to future care or treatment, and that the Practitioner or I may terminate the telemedicine visit at any time. I understand that I have the right to inspect all information obtained and/or recorded in the course of the telemedicine visit and may receive copies of available information for a reasonable fee.  I understand that some of the potential risks of receiving the Services via telemedicine include:  [name] Delay or interruption in medical evaluation due to technological equipment failure or disruption; . Information transmitted may not be sufficient (e.g. poor resolution of images) to allow for appropriate medical decision making by the Practitioner; and/or  . In rare instances, security protocols could fail, causing a breach of personal health information.  Furthermore, I acknowledge that it is my responsibility to provide information about my medical history, conditions and care that is complete and accurate to the best of my ability. I acknowledge that Practitioner's advice, recommendations, and/or decision may be based on factors not within their  control, such as incomplete or inaccurate data provided by me or distortions of diagnostic images or specimens that may result from electronic transmissions. I understand that the practice of medicine is not an exact science and that Practitioner makes no warranties or guarantees regarding treatment outcomes. I acknowledge that I will receive a copy of this consent concurrently upon execution via email to the email address I last provided but may also request a printed copy by calling the office of [facility].    I understand that my insurance will be billed for this visit.   I have read or had this consent read to me. . I understand the contents of this consent, which adequately explains the benefits and risks of the Services being provided via telemedicine.   . I have been provided ample opportunity to ask questions regarding this consent and the Services and have had my questions answered to my satisfaction. . I give my informed consent for the services to be provided through the use of telemedicine in my medical care  By participating in this telemedicine visit I agree to the above.

## 2019-09-05 NOTE — Progress Notes (Signed)
Virtual Visit via Telephone Note   This visit type was conducted due to national recommendations for restrictions regarding the COVID-19 Pandemic (e.g. social distancing) in an effort to limit this patient's exposure and mitigate transmission in our community.  Due to her co-morbid illnesses, this patient is at least at moderate risk for complications without adequate follow up.  This format is felt to be most appropriate for this patient at this time.  The patient did not have access to video technology/had technical difficulties with video requiring transitioning to audio format only (telephone).  All issues noted in this document were discussed and addressed.  No physical exam could be performed with this format.  Please refer to the patient's chart for her  consent to telehealth for [facility]   Date:  09/06/2019   ID:  PROMISE CREA, DOB 08/26/1961, MRN [id number]  Patient Location: Home Provider Location: Home  PCP:  Antony Blackbird, MD  Cardiologist:  Candee Furbish, MD / Richardson Dopp, PA-C  Electrophysiologist:  Cristopher Peru, MD   Evaluation Performed:  Follow-Up Visit  Chief Complaint: History of mitral valve replacement and tricuspid valve repair; intermittent complete heart block  History of Present Illness:    Mary [name] is a 58 y.o. female with    Severe MR (in setting of SBE) June 2014 ? S/p R mini-thoracotomy with mechanical MVR and TV repair 6/14 ? Post op c/b CHB >> resolved; no pacemaker needed ? Echocardiogram 02/2019: normal EF, normally functioning MVR and TV repair  Transient Complete Heart Block ? Holter 03/2014: asymptomatic CHB during sleep >> seen by EP Lovena Le); no PPM needed ? Holter 7/19: normal sinus rhythm, intermittent CHB during daytime >> seen by EP; PPM recommended; patient never called back to arrange  Hx of syncope 03/2019; ILR recommended; no insurance coverage  S/p TIA in 05/2016 (sub therapeutic INR)  Sinus tachycardia ? Beta blocker  tried in 5/17 but stopped due to high grade heart block >> seen by EP; no PPM needed  Cardiac Catheterization 2014:  No CAD  Macrocytic anemia 2/2 B12 deficiency  Ms. Valentini was previously followed by Dr. Aundra Dubin.  She was transitioned to Dr. Saunders Revel after Dr. Aundra Dubin switched to the [facility].  She has had issues with transient, asymptomatic complete heart block that occurred during sleep since her valve surgery.  She was not previously felt to require pacemaker implantation.  However, in 03/2018, she was felt to be symptomatic and had intermittent CHB during the day.  She was seen by EP and PPM was recommended.  However, she never called back to schedule this.    She was then admitted 03/2019 with syncope.  She was seen by Dr. Lovena Le for evaluation.  He favored ILR implantation.    Her insurance would not cover this.  Therefore, she has not undergone ILR implantation.  She was last seen in clinic in July 2020.  Today, she notes that she has not had any further syncope since she was last seen in clinic.  She has chronic shortness of breath.  She sleeps on an incline chronically.  She has not had leg swelling or chest pain.  She has not been seen in the office for Coumadin in 2 months.  She also has not followed up on her application for the [facility] Assistance.  She also notes that she is constantly clearing her throat.  It chokes her and sometimes she vomits.  She has not had melena, hematochezia, dyspepsia.  She does  note increased sinus drainage.  She sometimes has some left arm numbness.  She takes for aspirin for this and it resolves.  She has not had any focal deficits to suggest acute CVA.  The patient does not have symptoms concerning for COVID-19 infection (fever, chills, cough, or new shortness of breath).    Past Medical History:  Diagnosis Date  . Acute diastolic heart failure ([place]) 03/15/2013  . Anemia   . Carotid stenosis    Carotid US 2/17: bilateral ICA 1-39% >> FU prn  . Childhood asthma   .  Complete heart block ([place]) 05/28/2013   Post-op  . Epigastric hernia 200's  . GERD (gastroesophageal reflux disease)   . Heart murmur   . History of blood transfusion    "14 w/1st pregnancy; 2 w/last C-section" (03/15/2013)  . Hx of echocardiogram    a. Echo 4/16:  Mild focal basal septal hypertrophy, EF 60-65%, no RWMA, Mechanical MVR ok with mild central regurgitation and no perivalvular leak, small mobile density attached to valvular ring (1.5x1 cm) - post surgical changes vs SBE, mild LAE;   b. Echo 2/17:  Mild post wall LVH, EF 55-60%, no RWMA, mechanical MVR ok (mean 5 mmHg), normal RVSF  . Hypertension    no pcp   will go to [facility]  . 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 S/P mitral valve replacement with metallic valve [id number]   53mm Sorin Carbomedics mechanical prosthesis via right mini thoracotomy approach  . 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   Complex valvuloplasty including Cor-matrix ECM patch augmentation of anterior and lateral leaflets with 109mm Edwards mc3 ring annuloplasty via right mini-thoracotomy approach  . Severe mitral regurgitation 04/22/2013  . Stroke ([place])   . SVT (supraventricular tachycardia) ([place]) 03/16/2013  . Tricuspid regurgitation    Past Surgical History:  Procedure Laterality Date  . APPENDECTOMY  [id number]  . CARDIAC CATHETERIZATION    . CARDIAC VALVE REPLACEMENT    . CESAREAN SECTION  1983  . CESAREAN SECTION WITH BILATERAL TUBAL LIGATION  1999  . FEMORAL HERNIA REPAIR Right 05/23/2013   Procedure: HERNIA REPAIR FEMORAL;  Surgeon: Rexene Alberts, MD;  Location: [place];  Service: Open Heart Surgery;  Laterality: Right;  . INTRAOPERATIVE TRANSESOPHAGEAL ECHOCARDIOGRAM N/A 05/23/2013   Procedure: INTRAOPERATIVE TRANSESOPHAGEAL ECHOCARDIOGRAM;  Surgeon: Rexene Alberts, MD;  Location: [place];  Service: Open Heart Surgery;  Laterality: N/A;  . LAPAROSCOPIC CHOLECYSTECTOMY  2003  . LEFT AND RIGHT HEART CATHETERIZATION WITH CORONARY ANGIOGRAM  N/A 03/22/2013   Procedure: LEFT AND RIGHT HEART CATHETERIZATION WITH CORONARY ANGIOGRAM;  Surgeon: Burnell Blanks, MD;  Location: [facility] CATH LAB;  Service: Cardiovascular;  Laterality: N/A;  . MINIMALLY INVASIVE TRICUSPID VALVE REPAIR Right 05/23/2013   Procedure: MINIMALLY INVASIVE TRICUSPID VALVE REPAIR;  Surgeon: Rexene Alberts, MD;  Location: [place];  Service: Open Heart Surgery;  Laterality: Right;  . MITRAL VALVE REPLACEMENT N/A 05/23/2013   Procedure: MITRAL VALVE (MV) REPLACEMENT;  Surgeon: Rexene Alberts, MD;  Location: [place];  Service: Open Heart Surgery;  Laterality: N/A;  . MULTIPLE EXTRACTIONS WITH ALVEOLOPLASTY N/A 04/04/2013   Procedure: Extraction of tooth #'s 1,8,9,13,14,15,23,24,25,26 with alveoloplasty and gross debridement of remaining teeth;  Surgeon: Lenn Cal, DDS;  Location: [place];  Service: Oral Surgery;  Laterality: N/A;  . TEE WITHOUT CARDIOVERSION N/A 03/18/2013   Procedure: TRANSESOPHAGEAL ECHOCARDIOGRAM (TEE);  Surgeon: Larey Dresser, MD;  Location: [place];  Service: Cardiovascular;  Laterality: N/A;  . TEE  WITHOUT CARDIOVERSION N/A 06/17/2013   Procedure: TRANSESOPHAGEAL ECHOCARDIOGRAM (TEE);  Surgeon: Lelon Perla, MD;  Location: [facility] ENDOSCOPY;  Service: Cardiovascular;  Laterality: N/A;  . TUBAL LIGATION  1999     Current Meds  Medication Sig  . aspirin EC 81 MG tablet Take 1 tablet (81 mg total) by mouth daily.  . famotidine (PEPCID) 20 MG tablet Take 1 tablet (20 mg total) by mouth 2 (two) times daily.  . fluticasone (FLONASE) 50 MCG/ACT nasal spray Place 1 spray into both nostrils daily as needed for allergies or rhinitis.  . Hyprom-Naphaz-Polysorb-Zn Sulf (CLEAR EYES COMPLETE OP) Place 2 drops into both eyes daily as needed (red eyes).  [name] loratadine (CLARITIN) 10 MG tablet Take 1 tablet (10 mg total) by mouth daily.  [name] saccharomyces boulardii (FLORASTOR) 250 MG capsule Take 1 capsule (250 mg total) by mouth 2 (two) times daily.  [name]  warfarin (COUMADIN) 5 MG tablet Take 1 to 1.5 tablets daily as directed by coumadin clinic  . [DISCONTINUED] famotidine (PEPCID) 20 MG tablet Take 20 mg by mouth 2 (two) times daily.  . [DISCONTINUED] loratadine (CLARITIN) 10 MG tablet Take 10 mg by mouth daily.     Allergies:   Oxycodone   Social History   Tobacco Use  . Smoking status: Former Smoker    Packs/day: 0.50    Years: 36.00    Pack years: 18.00    Types: Cigarettes    Quit date: 03/15/2013    Years since quitting: 6.4  . Smokeless tobacco: Never Used  Substance Use Topics  . Alcohol use: Yes    Comment: 01/11/2017  "glass of wine/month"  . Drug use: No     Family Hx: The patient's family history includes Cancer in her mother; Sarcoidosis in her sister; Stroke in her father. There is no history of Heart attack.  ROS:   Please see the history of present illness.     All other systems reviewed and are negative.   Prior CV studies:   The following studies were reviewed today:   Echocardiogram 03/06/2019 EF > 65, no RWMA, s/p mechanical MVR with normal function, s/p TV repair with trivial TR  Holter 03/30/18  Normal sinus rhythm and sinus tachycardia with average heart rate 109 bpm. Heart rate ranged from 27 to 171 bpm.  Intermittent complete heart block with slow ventricular response at 47 bpm during the daytime.  SVT at 171 bpm  Occasional PVCs and wide-complex tachycardia up to 5 beats  Echocardiogram 02/06/18 EF 55-60, no RWMA, normally functioning mechanical MVR, TV repair functioning normally  Event Monitor 02/24/16 1 episode of high grade heart block at night. Otherwise, mild sinus tachycardia.   Carotid US 9/17 Bilateral: soft plaque throughout CCA, ICA and ECA. 1-39% ICA plaquing. Vertebral artery flow is antegrade.  Echo 06/06/16 EF 55-60%, mechanical MVR functioning normally, mild RAE, TV repair functioning normally  Echo 11/03/15 Posterior wall mild LVH, EF 55-60%, normal wall motion,  mechanical MVR okay, normal RVSF  Echo 01/01/15 Mild focal basal septal hypertrophy, EF 60-65%, normal wall motion, S/P MVR (St Jude mechanical valve) functioning well, mild MR (small mobile echodensity-likely redundant chord versus suture), mild LAE, TV mean gradient 4 mmHg.   Echo 7/15 Mild LVH, EF 60-65%, normal wall motion, grade 1 diastolic dysfunction, S/P MVR (St Jude mechanical valve) and appears functioning well, mild MR, no perivalvular leak, mild LAE, mildly reduced RVSF.   Carotid US 8/14 Bilateral - 1% to 39% ICA stenosis   Cardiac  cath 6/14 Left main: No obstructive disease.  Left Anterior Descending Artery: No obstructive disease noted.  Circumflex Artery: No obstructive disease.  Right Coronary Artery: no obstructive disease.  Left Ventricular Angiogram: LVEF 65-70%. 3+ MR Distal Aortogram: No aneurysm. No stenosis.  Labs/Other Tests and Data Reviewed:    EKG:  No ECG reviewed.  Recent Labs: 03/28/2019: ALT 17; TSH 2.481 03/30/2019: BUN 14; Creatinine, Ser 0.85; Magnesium 1.7; Potassium 3.9; Sodium 134 04/04/2019: Hemoglobin 7.8; Platelets 262   Recent Lipid Panel Lab Results  Component Value Date/Time   CHOL 189 05/31/2018 11:08 AM   TRIG 300 (H) 05/31/2018 11:08 AM   HDL 68 05/31/2018 11:08 AM   CHOLHDL 2.8 05/31/2018 11:08 AM   CHOLHDL 3.2 06/06/2016 01:35 AM   LDLCALC 61 05/31/2018 11:08 AM    Wt Readings from Last 3 Encounters:  09/06/19 126 lb (57.2 kg)  04/16/19 120 lb (54.4 kg)  04/04/19 123 lb 14.4 oz (56.2 kg)     Objective:    Vital Signs:  Ht 5\' 2"  (1.575 m)   Wt 126 lb (57.2 kg)   LMP 03/22/2013   BMI 23.05 kg/m    VITAL SIGNS:  reviewed GEN:  no acute distress RESPIRATORY:  No labored breathing noted NEURO:  Alert and oriented PSYCH:  Normal mood  ASSESSMENT & PLAN:    1. Syncope, unspecified syncope type 2. Intermittent complete heart block ([place]) She has a history of intermittent complete heart block.  This first  occurred after her mitral valve surgery.  Over the years, she has not required pacemaker implantation.  In 2019, it was felt that she had symptomatic bradycardia and pacemaker implantation was recommended.  However, she never scheduled this.  She had a syncopal episode in July 2020.  Loop recorder was favored by Dr. Lovena Le.  However, the patient does not have financial coverage and has not been able to complete the application for [facility] Assistance.  Social work has been involved with her trying to get this arranged.  I will follow up with Dr. Tanna Furry nurse as well as the social worker to continue to work on this.  Luckily, since last seen, she has not had further syncope.  3. 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 S/P minimally-invasive tricuspid valve repair Echo in June 2020 demonstrated normally functioning mechanical mitral valve prosthesis and tricuspid valve repair.  Continue SBE prophylaxis.  Continue Coumadin, aspirin.  Of note, she has not been seen in the Coumadin clinic in 2 months.  Again I have sent a message to our social worker to help with transportation issues as well as the Coumadin clinic.  I did express to Ms. Garczynski the importance of close follow-up on her Coumadin to prevent bleeding as well as to prevent recurrent stroke.  She does have some transient left arm numbness.  I advised her to go to the emergency room if she has sudden onset facial drooping, difficulty with speech or focal weakness.  5. Chronic throat clearing She seems to be describing aspiration.  She does have increased sinus drainage.  Question if she has acid reflux.  She does not have any focal deficits.  She needs follow-up with primary care.  For now, I have recommended that she take Claritin 10 mg daily and Pepcid 20 mg twice daily.  I have also asked social work to help arrange follow-up with primary care.   Time:   Today, I have spent 15 minutes with the patient with telehealth technology discussing  the above problems.     Medication Adjustments/Labs and Tests Ordered: Current medicines are reviewed at length with the patient today.  Concerns regarding medicines are outlined above.   Tests Ordered: No orders of the defined types were placed in this encounter.   Medication Changes: Meds ordered this encounter  Medications  . famotidine (PEPCID) 20 MG tablet    Sig: Take 1 tablet (20 mg total) by mouth 2 (two) times daily.    Dispense:  180 tablet    Refill:  1  . loratadine (CLARITIN) 10 MG tablet    Sig: Take 1 tablet (10 mg total) by mouth daily.    Dispense:  90 tablet    Refill:  1    Follow Up:  In Person in 6 month(s)  Signed, Richardson Dopp, PA-C  09/06/2019 9:40 AM    [place]

## 2019-09-06 ENCOUNTER — Telehealth (HOSPITAL_COMMUNITY): Payer: Self-pay | Admitting: Licensed Clinical Social Worker

## 2019-09-06 ENCOUNTER — Telehealth (INDEPENDENT_AMBULATORY_CARE_PROVIDER_SITE_OTHER): Payer: Medicaid Other | Admitting: Physician Assistant

## 2019-09-06 ENCOUNTER — Encounter: Payer: Self-pay | Admitting: Physician Assistant

## 2019-09-06 ENCOUNTER — Other Ambulatory Visit: Payer: Self-pay

## 2019-09-06 VITALS — Ht 62.0 in | Wt 126.0 lb

## 2019-09-06 DIAGNOSIS — Z954 Presence of other heart-valve replacement: Secondary | ICD-10-CM

## 2019-09-06 DIAGNOSIS — R6889 Other general symptoms and signs: Secondary | ICD-10-CM

## 2019-09-06 DIAGNOSIS — I442 Atrioventricular block, complete: Secondary | ICD-10-CM

## 2019-09-06 DIAGNOSIS — Z9889 Other specified postprocedural states: Secondary | ICD-10-CM

## 2019-09-06 DIAGNOSIS — R55 Syncope and collapse: Secondary | ICD-10-CM

## 2019-09-06 DIAGNOSIS — R0989 Other specified symptoms and signs involving the circulatory and respiratory systems: Secondary | ICD-10-CM

## 2019-09-06 MED ORDER — FAMOTIDINE 20 MG PO TABS: 20.0000 mg | ORAL_TABLET | Freq: Two times a day (BID) | ORAL | 1 refills | Status: DC

## 2019-09-06 MED ORDER — LORATADINE 10 MG PO TABS: 10.0000 mg | ORAL_TABLET | Freq: Every day | ORAL | 1 refills | Status: DC

## 2019-09-06 NOTE — Patient Instructions (Signed)
Medication Instructions:   Your physician has recommended you make the following change in your medication:   1) Start Claritin 10MG , 1 tablet by mouth once a day. 2) Start Pepcid 20MG , 1 tablet by mouth twice a day.  *If you need a refill on your cardiac medications before your next appointment, please call your pharmacy*  Lab Work:  None ordered today  Testing/Procedures:  None ordered today  Follow-Up: At [facility], you and your health needs are our priority.  As part of our continuing mission to provide you with exceptional heart care, we have created designated Provider Care Teams.  These Care Teams include your primary Cardiologist (physician) and Advanced Practice Providers (APPs -  Physician Assistants and Nurse Practitioners) who all work together to provide you with the care you need, when you need it.  Your next appointment:   6 month(s)  The format for your next appointment:   In Person  Provider:   Richardson Dopp, PA-C

## 2019-09-06 NOTE — Telephone Encounter (Signed)
CSW consulted to assist patient with provider follow up and with [facility]  CSW spoke with pt who is agreeable to having coumadin check appt and PCP appt scheduled with assistance.  CSW able to get patient coumadin appt for Monday and set up transport through [employer].  CSW also able to get pt appt with [facility]W on 12/17 to follow up regarding other medical concerns.  CSW also inquired about [employer] - CSW had mailed out to patient twice and patient states she hasn't gotten it.  CSW mailed again and informed pt to look out for it as she needs coverage for testing/procedures.  Pt expresses understanding and reports she will keep an eye out for application.  CSW will continue to follow and assist as needed  Jorge Ny, [place] [facility] Desk#: [phone] Cell#: [phone]

## 2019-09-09 ENCOUNTER — Telehealth (HOSPITAL_COMMUNITY): Payer: Self-pay | Admitting: Licensed Clinical Social Worker

## 2019-09-09 ENCOUNTER — Other Ambulatory Visit: Payer: Self-pay

## 2019-09-09 ENCOUNTER — Ambulatory Visit (INDEPENDENT_AMBULATORY_CARE_PROVIDER_SITE_OTHER): Payer: Self-pay | Admitting: *Deleted

## 2019-09-09 DIAGNOSIS — Z7901 Long term (current) use of anticoagulants: Secondary | ICD-10-CM

## 2019-09-09 LAB — POCT INR: INR: 2.3 (ref 2.0–3.0)

## 2019-09-09 NOTE — Patient Instructions (Signed)
Description   Today take 2 tablets then continue on same dosage 1 tablet everyday except 1.5 tablets on Mondays, Wednesdays and Fridays. Recheck INR in 3 weeks. Continue eating the same amount of leafy green vegetables.  Coumadin Clinic # [phone]

## 2019-09-09 NOTE — Telephone Encounter (Signed)
CSW called pt to confirm she still needed transport to coumadin appt this morning.  Pt confirms and states she has already gotten a text from [place] her she will be picked up at 8:45am  CSW will continue to follow and assist as needed  Jorge Ny, [place] [facility] Desk#: [phone] Cell#: [phone]

## 2019-09-12 ENCOUNTER — Encounter: Payer: Self-pay | Admitting: Nurse Practitioner

## 2019-09-12 ENCOUNTER — Ambulatory Visit: Payer: Medicaid Other | Attending: Family Medicine | Admitting: Physician Assistant

## 2019-09-12 ENCOUNTER — Other Ambulatory Visit: Payer: Self-pay

## 2019-09-12 DIAGNOSIS — I10 Essential (primary) hypertension: Secondary | ICD-10-CM

## 2019-09-12 DIAGNOSIS — R1111 Vomiting without nausea: Secondary | ICD-10-CM

## 2019-09-12 DIAGNOSIS — R0689 Other abnormalities of breathing: Secondary | ICD-10-CM

## 2019-09-12 MED ORDER — ONDANSETRON HCL 8 MG PO TABS: 8.0000 mg | ORAL_TABLET | Freq: Three times a day (TID) | ORAL | 0 refills | Status: DC | PRN

## 2019-09-12 NOTE — Progress Notes (Signed)
Virtual Visit via Telephone Note  I connected with [name] on 09/12/19 at  2:30 PM EST by telephone and verified that I am speaking with the correct person using two identifiers.   I discussed the limitations, risks, security and privacy concerns of performing an evaluation and management service by telephone and the availability of in person appointments. I also discussed with the patient that there may be a patient responsible charge related to this service. The patient expressed understanding and agreed to proceed.  Patient location: home  My Location:  [place] office Persons on the call:  Me and the patient  History of Present Illness:  Patient is complaining with a choking sensation at night and food refluxing/vomiting after meals for about 1 year.  No f/c.  No weight loss.  No early satiety.  Still anticoagulated.  Occasional diarrhea but no constipation.  No melena.  Occasional BRBPR.  Appetite is good.  She has never had a colonoscopy.  She has not taken anything for this.  Doesn't really have nausea but does vomit some days Cardiology started her on Pepcid and claritin for this thinking it may be acid reflux.  This was done earlier this months   Observations/Objective:  Sounds sleepy.  NAD.  A&Ox3   Assessment and Plan: 1. Vomiting without nausea, intractability of vomiting not specified, unspecified vomiting type - Ambulatory referral to Gastroenterology - CBC with Differential; Future - TSH; Future - ondansetron (ZOFRAN) 8 MG tablet; Take 1 tablet (8 mg total) by mouth every 8 (eight) hours as needed for nausea or vomiting.  Dispense: 30 tablet; Refill: 0 Cardiology started her on Pepcid and claritin for this thinking it may be acid reflux.   -h. Pylori  2. Essential hypertension - Comprehensive metabolic panel; Future -continue current regimen  3. Sleep related choking sensation - Ambulatory referral to Gastroenterology  Follow Up Instructions: See PCP in 6-8 weeks;   Sooner if needed   I discussed the assessment and treatment plan with the patient. The patient was provided an opportunity to ask questions and all were answered. The patient agreed with the plan and demonstrated an understanding of the instructions.   The patient was advised to call back or seek an in-person evaluation if the symptoms worsen or if the condition fails to improve as anticipated.  I provided 13 minutes of non-face-to-face time during this encounter.   Freeman Caldron, PA-C  Patient ID: Mary [name], female   DOB: 04-16-1961, 58 y.o.   MRN: [id number]

## 2019-09-26 ENCOUNTER — Telehealth (HOSPITAL_COMMUNITY): Payer: Self-pay | Admitting: Licensed Clinical Social Worker

## 2019-09-26 NOTE — Telephone Encounter (Signed)
CSW called pt to check to see if she has received [employer]FA- pt reports she just received the assistance application yesterday and will plan to submit as soon as she can.  Pt also reports that she has a coumadin clinic ride on Monday that she needs ride assistance for- CSW set up ride through [employer].  CSW will continue to follow and assist as needed  Jorge Ny, [place] [facility] Desk#: [phone] Cell#: [phone]

## 2019-09-30 ENCOUNTER — Ambulatory Visit (INDEPENDENT_AMBULATORY_CARE_PROVIDER_SITE_OTHER): Payer: Medicaid Other | Admitting: Pharmacist

## 2019-09-30 ENCOUNTER — Other Ambulatory Visit: Payer: Self-pay

## 2019-09-30 DIAGNOSIS — Z7901 Long term (current) use of anticoagulants: Secondary | ICD-10-CM

## 2019-09-30 LAB — POCT INR: INR: 2.5 (ref 2.0–3.0)

## 2019-09-30 MED ORDER — WARFARIN SODIUM 5 MG PO TABS: ORAL_TABLET | ORAL | 0 refills | Status: DC

## 2019-09-30 NOTE — Patient Instructions (Signed)
Description   Today take 1.5 tablets today and 1.5 tablets tomorrow then continue on same dosage 1 tablet everyday except 1.5 tablets on Mondays, Wednesdays and Fridays. Recheck INR in 2 weeks. Continue eating the same amount of leafy green vegetables.  Coumadin Clinic # [phone]

## 2019-10-09 ENCOUNTER — Ambulatory Visit: Payer: Medicaid Other | Admitting: Nurse Practitioner

## 2019-10-11 ENCOUNTER — Telehealth (HOSPITAL_COMMUNITY): Payer: Self-pay | Admitting: Licensed Clinical Social Worker

## 2019-10-11 NOTE — Telephone Encounter (Signed)
CSW called to request help with ride to coumadin appt on Tuesday- CSW able to set up through [employer]  Pt inquired about ride that same day to her GI appt but CSW unable to range to office outside of cardiology as they are not set up to bill for their services- CSW informed pt of this and encouraged her to call the GI office to inquire about any services or call [employer] to see if they can provide a ride Advice worker which provides 1 medical ride a week to seniors over 55yo).   CSW will continue to follow and assist as needed  Jorge Ny, [place] [facility] Desk#: [phone] Cell#: [phone]

## 2019-10-15 ENCOUNTER — Ambulatory Visit: Payer: Medicaid Other | Admitting: Nurse Practitioner

## 2019-10-16 NOTE — Telephone Encounter (Signed)
Pt has f/u scheduled with [facility] for 2/1. Message sent to social work staff to see if any advancement had been made on getting insurance assistance.

## 2019-10-28 ENCOUNTER — Ambulatory Visit: Payer: Medicaid Other | Admitting: Internal Medicine

## 2019-11-04 ENCOUNTER — Ambulatory Visit (INDEPENDENT_AMBULATORY_CARE_PROVIDER_SITE_OTHER): Payer: Self-pay | Admitting: *Deleted

## 2019-11-04 ENCOUNTER — Telehealth (HOSPITAL_COMMUNITY): Payer: Self-pay | Admitting: Licensed Clinical Social Worker

## 2019-11-04 ENCOUNTER — Other Ambulatory Visit: Payer: Self-pay

## 2019-11-04 DIAGNOSIS — Z7901 Long term (current) use of anticoagulants: Secondary | ICD-10-CM

## 2019-11-04 LAB — POCT INR: INR: 2.8 (ref 2.0–3.0)

## 2019-11-04 MED ORDER — WARFARIN SODIUM 5 MG PO TABS: ORAL_TABLET | ORAL | 0 refills | Status: DC

## 2019-11-04 NOTE — Telephone Encounter (Signed)
CSW received call from pt requesting help with transportation to her coumadin appt this afternoon- had been planning to get a ride from her son but he is no longer able to assist.  CSW able to set up transport through [employer] for this afternoon- pt informed  Jorge Ny, [place] [facility] Desk#: [phone] Cell#: [phone]

## 2019-11-04 NOTE — Patient Instructions (Addendum)
Description   Continue taking 1 tablet everyday except 1.5 tablets on Mondays, Wednesdays and Fridays. Recheck INR in 5 weeks. Continue eating the same amount of leafy green vegetables.  Coumadin Clinic # [phone]

## 2019-11-05 ENCOUNTER — Telehealth: Payer: Self-pay

## 2019-11-05 NOTE — Telephone Encounter (Signed)
-----   Message from Jorge Ny, [place] sent at 10/16/2019  2:01 PM EST ----- Regarding: RE: insurance Unfortunately the patient does not appear to be eligible for normal Medicaid- she was reviewed by our financial counselors back in March they state she would not be eligible for full ongoing medicaid at that time as she didn't have a disability that would warrant a 12 month impairment  I have sent her the [facility] assistance fund application multiple times and I spoke to her last week she said she was still working on it- I just left her a message to discuss again and remind her that it is time sensitive. ----- Message ----- From: Damian Leavell, RN Sent: 10/16/2019   1:38 PM EST To: Jorge Ny, LCSW Subject: insurance                                      Are you aware if Pt has ever had her medicaid changed from family planning or if she got assistance through [facility] for a payment plan/assistance?  She sees Dr. Lovena Le in February, she really needs a loop placed but she will need insurance to cover the cost (or help from [facility])  Was just wondering if you were aware of any updates on that?  Sonia Baller RN

## 2019-12-09 ENCOUNTER — Telehealth: Payer: Self-pay | Admitting: Licensed Clinical Social Worker

## 2019-12-09 NOTE — Telephone Encounter (Signed)
CSW received call from pt requesting help making it to [facility] appt on Wednesday as she cannot find a ride.  CSW sent in ride request with [facility] [employer] to take to appt.  CSW also inquired with pt if she has gotten her vaccine yet or is interested in getting it- pt reports she is not interested at this time but will call back for help if she decides to get it.  CSW will continue to follow and assist as needed  Jorge Ny, [place] [facility] Desk#: [phone] Cell#: [phone]

## 2019-12-11 ENCOUNTER — Ambulatory Visit: Payer: Medicaid Other | Admitting: Nurse Practitioner

## 2019-12-11 ENCOUNTER — Other Ambulatory Visit: Payer: Self-pay

## 2019-12-11 ENCOUNTER — Ambulatory Visit (INDEPENDENT_AMBULATORY_CARE_PROVIDER_SITE_OTHER): Payer: Self-pay | Admitting: Internal Medicine

## 2019-12-11 ENCOUNTER — Ambulatory Visit (INDEPENDENT_AMBULATORY_CARE_PROVIDER_SITE_OTHER): Payer: Self-pay | Admitting: *Deleted

## 2019-12-11 VITALS — BP 94/62 | HR 117 | Ht 62.0 in | Wt 122.0 lb

## 2019-12-11 DIAGNOSIS — I442 Atrioventricular block, complete: Secondary | ICD-10-CM

## 2019-12-11 DIAGNOSIS — D649 Anemia, unspecified: Secondary | ICD-10-CM

## 2019-12-11 DIAGNOSIS — Z7901 Long term (current) use of anticoagulants: Secondary | ICD-10-CM

## 2019-12-11 DIAGNOSIS — Z5181 Encounter for therapeutic drug level monitoring: Secondary | ICD-10-CM

## 2019-12-11 DIAGNOSIS — R55 Syncope and collapse: Secondary | ICD-10-CM

## 2019-12-11 LAB — POCT INR: INR: 2.6 (ref 2.0–3.0)

## 2019-12-11 NOTE — Progress Notes (Signed)
HPI  Allergies  Allergen Reactions  . Oxycodone Itching     Current Outpatient Medications  Medication Sig Dispense Refill  . aspirin EC 81 MG tablet Take 1 tablet (81 mg total) by mouth daily.    . famotidine (PEPCID) 20 MG tablet Take 1 tablet (20 mg total) by mouth 2 (two) times daily. 180 tablet 1  . fluticasone (FLONASE) 50 MCG/ACT nasal spray Place 1 spray into both nostrils daily as needed for allergies or rhinitis.    . Hyprom-Naphaz-Polysorb-Zn Sulf (CLEAR EYES COMPLETE OP) Place 2 drops into both eyes daily as needed (red eyes).    [name] loratadine (CLARITIN) 10 MG tablet Take 1 tablet (10 mg total) by mouth daily. 90 tablet 1  . ondansetron (ZOFRAN) 8 MG tablet Take 1 tablet (8 mg total) by mouth every 8 (eight) hours as needed for nausea or vomiting. 30 tablet 0  . saccharomyces boulardii (FLORASTOR) 250 MG capsule Take 1 capsule (250 mg total) by mouth 2 (two) times daily. 60 capsule 0  . warfarin (COUMADIN) 5 MG tablet Take 1 to 1.5 tablets daily as directed by Anticoagulation Clinic 40 tablet 0   No current facility-administered medications for this visit.     Past Medical History:  Diagnosis Date  . Acute diastolic heart failure ([place]) 03/15/2013  . Anemia   . Carotid stenosis    Carotid US 2/17: bilateral ICA 1-39% >> FU prn  . Childhood asthma   . Complete heart block ([place]) 05/28/2013   Post-op  . Epigastric hernia 200's  . GERD (gastroesophageal reflux disease)   . Heart murmur   . History of blood transfusion    "14 w/1st pregnancy; 2 w/last C-section" (03/15/2013)  . Hx of echocardiogram    a. Echo 4/16:  Mild focal basal septal hypertrophy, EF 60-65%, no RWMA, Mechanical MVR ok with mild central regurgitation and no perivalvular leak, small mobile density attached to valvular ring (1.5x1 cm) - post surgical changes vs SBE, mild LAE;   b. Echo 2/17:  Mild post wall LVH, EF 55-60%, no RWMA, mechanical MVR ok (mean 5 mmHg), normal RVSF  . Hypertension    no  pcp   will go to [facility]  . 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 S/P mitral valve replacement with metallic valve 123XX123   73mm Sorin Carbomedics mechanical prosthesis via right mini thoracotomy approach  . 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 Severe mitral regurgitation 04/22/2013  . Stroke ([place])   . SVT (supraventricular tachycardia) ([place]) 03/16/2013  . Tricuspid regurgitation     ROS:   All systems reviewed and negative except as noted in the HPI.   Past Surgical History:  Procedure Laterality Date  . APPENDECTOMY  [id number]  . CARDIAC CATHETERIZATION    . CARDIAC VALVE REPLACEMENT    . CESAREAN SECTION  1983  . CESAREAN SECTION WITH BILATERAL TUBAL LIGATION  1999  . FEMORAL HERNIA REPAIR Right 05/23/2013   Procedure: HERNIA REPAIR FEMORAL;  Surgeon: Rexene Alberts, MD;  Location: [place];  Service: Open Heart Surgery;  Laterality: Right;  . INTRAOPERATIVE TRANSESOPHAGEAL ECHOCARDIOGRAM N/A 05/23/2013   Procedure: INTRAOPERATIVE TRANSESOPHAGEAL ECHOCARDIOGRAM;  Surgeon: Rexene Alberts, MD;  Location: [place];  Service: Open Heart Surgery;  Laterality: N/A;  . LAPAROSCOPIC CHOLECYSTECTOMY  2003  . LEFT AND RIGHT HEART  CATHETERIZATION WITH CORONARY ANGIOGRAM N/A 03/22/2013   Procedure: LEFT AND RIGHT HEART CATHETERIZATION WITH CORONARY ANGIOGRAM;  Surgeon: Burnell Blanks, MD;  Location: [facility] CATH LAB;  Service: Cardiovascular;  Laterality: N/A;  . MINIMALLY INVASIVE TRICUSPID VALVE REPAIR Right 05/23/2013   Procedure: MINIMALLY INVASIVE TRICUSPID VALVE REPAIR;  Surgeon: Rexene Alberts, MD;  Location: [place];  Service: Open Heart Surgery;  Laterality: Right;  . MITRAL VALVE REPLACEMENT N/A 05/23/2013   Procedure: MITRAL VALVE (MV) REPLACEMENT;  Surgeon: Rexene Alberts, MD;  Location: [place];   Service: Open Heart Surgery;  Laterality: N/A;  . MULTIPLE EXTRACTIONS WITH ALVEOLOPLASTY N/A 04/04/2013   Procedure: Extraction of tooth #'s 1,8,9,13,14,15,23,24,25,26 with alveoloplasty and gross debridement of remaining teeth;  Surgeon: Lenn Cal, DDS;  Location: [place];  Service: Oral Surgery;  Laterality: N/A;  . TEE WITHOUT CARDIOVERSION N/A 03/18/2013   Procedure: TRANSESOPHAGEAL ECHOCARDIOGRAM (TEE);  Surgeon: Larey Dresser, MD;  Location: [place];  Service: Cardiovascular;  Laterality: N/A;  . TEE WITHOUT CARDIOVERSION N/A 06/17/2013   Procedure: TRANSESOPHAGEAL ECHOCARDIOGRAM (TEE);  Surgeon: Lelon Perla, MD;  Location: [facility] ENDOSCOPY;  Service: Cardiovascular;  Laterality: N/A;  . TUBAL LIGATION  1999     Family History  Problem Relation Age of Onset  . Cancer Mother   . Stroke Father   . Sarcoidosis Sister   . Heart attack Neg Hx      Social History   Socioeconomic History  . Marital status: Single    Spouse name: Not on file  . Number of children: Not on file  . Years of education: Not on file  . Highest education level: Not on file  Occupational History  . Not on file  Tobacco Use  . Smoking status: Former Smoker    Packs/day: 0.50    Years: 36.00    Pack years: 18.00    Types: Cigarettes    Quit date: 03/15/2013    Years since quitting: 6.7  . Smokeless tobacco: Never Used  Substance and Sexual Activity  . Alcohol use: Yes    Comment: 01/11/2017  "glass of wine/month"  . Drug use: No  . Sexual activity: Never  Other Topics Concern  . Not on file  Social History Narrative  . Not on file   Social Determinants of Health   Financial Resource Strain:   . Difficulty of Paying Living Expenses:   Food Insecurity:   . Worried About Charity fundraiser in the Last Year:   . Arboriculturist in the Last Year:   Transportation Needs:   . Film/video editor (Medical):   [name] Lack of Transportation (Non-Medical):   Physical Activity:   . Days  of Exercise per Week:   . Minutes of Exercise per Session:   Stress:   . Feeling of Stress :   Social Connections:   . Frequency of Communication with Friends and Family:   . Frequency of Social Gatherings with Friends and Family:   . Attends Religious Services:   . Active Member of Clubs or Organizations:   . Attends Archivist Meetings:   [name] Marital Status:   Intimate Partner Violence:   . Fear of Current or Ex-Partner:   . Emotionally Abused:   [name] Physically Abused:   . Sexually Abused:      BP 94/62   Pulse (!) 117   Ht 5\' 2"  (1.575 m)   Wt 122 lb (55.3 kg)   LMP 03/22/2013  BMI 22.31 kg/m   Physical Exam:  Well appearing NAD HEENT: Unremarkable Neck:  No JVD, no thyromegally Lymphatics:  No adenopathy Back:  No CVA tenderness Lungs:  Clear HEART:  Regular rate rhythm, no murmurs, no rubs, no clicks Abd:  soft, positive bowel sounds, no organomegally, no rebound, no guarding Ext:  2 plus pulses, no edema, no cyanosis, no clubbing Skin:  No rashes no nodules Neuro:  CN II through XII intact, motor grossly intact  EKG  DEVICE  Normal device function.  See PaceArt for details.   Assess/Plan:

## 2019-12-11 NOTE — Patient Instructions (Signed)
Description   Continue taking 1 tablet everyday except 1.5 tablets on Mondays, Wednesdays and Fridays. Recheck INR in 6 weeks. Continue eating the same amount of leafy green vegetables.  Coumadin Clinic # [phone]

## 2019-12-11 NOTE — Patient Instructions (Addendum)
Medication Instructions:  Your physician recommends that you continue on your current medications as directed. Please refer to the Current Medication list given to you today.  Labwork: You will get lab work today:   CBC with diff, BMP, liver panel, haptoglobin,  reticulocyte count  Testing/Procedures: None ordered.  Follow-Up: Your physician wants you to follow-up in: based on results of your lab work:   Any Other Special Instructions Will Be Listed Below (If Applicable).  If you need a refill on your cardiac medications before your next appointment, please call your pharmacy.

## 2019-12-11 NOTE — Progress Notes (Signed)
HPI Mary [name] returns today for followup. She has a h/o syncope remotely and transient AV block. She was noted to be anemic with a hgb of 7.8 in July. She feels tired and weak. She has no energy. She denies non-compliance and her INR has been therapeutic. She has severe fatigue and weakness and has difficulty walking at all. Allergies  Allergen Reactions  . Oxycodone Itching     Current Outpatient Medications  Medication Sig Dispense Refill  . aspirin EC 81 MG tablet Take 1 tablet (81 mg total) by mouth daily.    . famotidine (PEPCID) 20 MG tablet Take 1 tablet (20 mg total) by mouth 2 (two) times daily. 180 tablet 1  . fluticasone (FLONASE) 50 MCG/ACT nasal spray Place 1 spray into both nostrils daily as needed for allergies or rhinitis.    . Hyprom-Naphaz-Polysorb-Zn Sulf (CLEAR EYES COMPLETE OP) Place 2 drops into both eyes daily as needed (red eyes).    [name] loratadine (CLARITIN) 10 MG tablet Take 1 tablet (10 mg total) by mouth daily. 90 tablet 1  . ondansetron (ZOFRAN) 8 MG tablet Take 1 tablet (8 mg total) by mouth every 8 (eight) hours as needed for nausea or vomiting. 30 tablet 0  . saccharomyces boulardii (FLORASTOR) 250 MG capsule Take 1 capsule (250 mg total) by mouth 2 (two) times daily. 60 capsule 0  . warfarin (COUMADIN) 5 MG tablet Take 1 to 1.5 tablets daily as directed by Anticoagulation Clinic 40 tablet 0   No current facility-administered medications for this visit.     Past Medical History:  Diagnosis Date  . Acute diastolic heart failure ([place]) 03/15/2013  . Anemia   . Carotid stenosis    Carotid US 2/17: bilateral ICA 1-39% >> FU prn  . Childhood asthma   . Complete heart block ([place]) 05/28/2013   Post-op  . Epigastric hernia 200's  . GERD (gastroesophageal reflux disease)   . Heart murmur   . History of blood transfusion    "14 w/1st pregnancy; 2 w/last C-section" (03/15/2013)  . Hx of echocardiogram    a. Echo 4/16:  Mild focal basal septal hypertrophy,  EF 60-65%, no RWMA, Mechanical MVR ok with mild central regurgitation and no perivalvular leak, small mobile density attached to valvular ring (1.5x1 cm) - post surgical changes vs SBE, mild LAE;   b. Echo 2/17:  Mild post wall LVH, EF 55-60%, no RWMA, mechanical MVR ok (mean 5 mmHg), normal RVSF  . Hypertension    no pcp   will go to [facility]  . 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 S/P mitral valve replacement with metallic valve 123XX123   67mm Sorin Carbomedics mechanical prosthesis via right mini thoracotomy approach  . 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 Severe mitral regurgitation 04/22/2013  . Stroke ([place])   . SVT (supraventricular tachycardia) ([place]) 03/16/2013  . Tricuspid regurgitation     ROS:   All systems reviewed and negative except as noted in the HPI.   Past Surgical History:  Procedure Laterality Date  . APPENDECTOMY  [id number]  . CARDIAC CATHETERIZATION    . CARDIAC VALVE REPLACEMENT    . CESAREAN SECTION  1983  . CESAREAN SECTION WITH BILATERAL TUBAL LIGATION  1999  . FEMORAL HERNIA REPAIR Right 05/23/2013   Procedure: HERNIA REPAIR FEMORAL;  Surgeon: Braulio Conte  Keturah Barre, MD;  Location: [place];  Service: Open Heart Surgery;  Laterality: Right;  . INTRAOPERATIVE TRANSESOPHAGEAL ECHOCARDIOGRAM N/A 05/23/2013   Procedure: INTRAOPERATIVE TRANSESOPHAGEAL ECHOCARDIOGRAM;  Surgeon: Rexene Alberts, MD;  Location: [place];  Service: Open Heart Surgery;  Laterality: N/A;  . LAPAROSCOPIC CHOLECYSTECTOMY  2003  . LEFT AND RIGHT HEART CATHETERIZATION WITH CORONARY ANGIOGRAM N/A 03/22/2013   Procedure: LEFT AND RIGHT HEART CATHETERIZATION WITH CORONARY ANGIOGRAM;  Surgeon: Burnell Blanks, MD;  Location[facility] CATH LAB;  Service: Cardiovascular;  Laterality: N/A;  . MINIMALLY INVASIVE TRICUSPID  VALVE REPAIR Right 05/23/2013   Procedure: MINIMALLY INVASIVE TRICUSPID VALVE REPAIR;  Surgeon: Rexene Alberts, MD;  Location: [place];  Service: Open Heart Surgery;  Laterality: Right;  . MITRAL VALVE REPLACEMENT N/A 05/23/2013   Procedure: MITRAL VALVE (MV) REPLACEMENT;  Surgeon: Rexene Alberts, MD;  Location: [place];  Service: Open Heart Surgery;  Laterality: N/A;  . MULTIPLE EXTRACTIONS WITH ALVEOLOPLASTY N/A 04/04/2013   Procedure: Extraction of tooth #'s 1,8,9,13,14,15,23,24,25,26 with alveoloplasty and gross debridement of remaining teeth;  Surgeon: Lenn Cal, DDS;  Location: [place];  Service: Oral Surgery;  Laterality: N/A;  . TEE WITHOUT CARDIOVERSION N/A 03/18/2013   Procedure: TRANSESOPHAGEAL ECHOCARDIOGRAM (TEE);  Surgeon: Larey Dresser, MD;  Location: [place];  Service: Cardiovascular;  Laterality: N/A;  . TEE WITHOUT CARDIOVERSION N/A 06/17/2013   Procedure: TRANSESOPHAGEAL ECHOCARDIOGRAM (TEE);  Surgeon: Lelon Perla, MD;  Location: [facility] ENDOSCOPY;  Service: Cardiovascular;  Laterality: N/A;  . TUBAL LIGATION  1999     Family History  Problem Relation Age of Onset  . Cancer Mother   . Stroke Father   . Sarcoidosis Sister   . Heart attack Neg Hx      Social History   Socioeconomic History  . Marital status: Single    Spouse name: Not on file  . Number of children: Not on file  . Years of education: Not on file  . Highest education level: Not on file  Occupational History  . Not on file  Tobacco Use  . Smoking status: Former Smoker    Packs/day: 0.50    Years: 36.00    Pack years: 18.00    Types: Cigarettes    Quit date: 03/15/2013    Years since quitting: 6.7  . Smokeless tobacco: Never Used  Substance and Sexual Activity  . Alcohol use: Yes    Comment: 01/11/2017  "glass of wine/month"  . Drug use: No  . Sexual activity: Never  Other Topics Concern  . Not on file  Social History Narrative  . Not on file   Social Determinants of Health    Financial Resource Strain:   . Difficulty of Paying Living Expenses:   Food Insecurity:   . Worried About Charity fundraiser in the Last Year:   . Arboriculturist in the Last Year:   Transportation Needs:   . Film/video editor (Medical):   [name] Lack of Transportation (Non-Medical):   Physical Activity:   . Days of Exercise per Week:   . Minutes of Exercise per Session:   Stress:   . Feeling of Stress :   Social Connections:   . Frequency of Communication with Friends and Family:   . Frequency of Social Gatherings with Friends and Family:   . Attends Religious Services:   . Active Member of Clubs or Organizations:   . Attends Archivist Meetings:   [name] Marital Status:  Intimate Partner Violence:   . Fear of Current or Ex-Partner:   . Emotionally Abused:   [name] Physically Abused:   . Sexually Abused:      BP 94/62   Pulse (!) 117   Ht 5\' 2"  (1.575 m)   Wt 122 lb (55.3 kg)   LMP 03/22/2013   BMI 22.31 kg/m   Physical Exam:  ill appearing NAD HEENT: Unremarkable Neck:  6 cm JVD, no thyromegally Lymphatics:  No adenopathy Back:  No CVA tenderness Lungs:  Clear with no wheezes HEART:  Regular tachy rhythm, no murmurs, no rubs, no clicks Abd:  soft, positive bowel sounds, no organomegally, no rebound, no guarding Ext:  2 plus pulses, no edema, no cyanosis, no clubbing Skin:  No rashes no nodules Neuro:  CN II through XII intact, motor grossly intact  EKG - sinus tachycardia  Assess/Plan: 1. Severe fatigue and weakness - I am worried that her symptoms are due to anemia. Her hgb was low several months ago and has not been rechecked. We will draw her blood today.  2. Heart block - she has had transient AV block in the past. Her current symptoms are not related to this. I would hold off on PPM for now. 3. MS - she is s/p mitral valve repair. I do not hear much in the way of MR on exam but am concerned that her anemia could be due to hemolysis. If her  haptoglobin is increased, then we will plan an echo.  4. Chest pain - her pain appears to be well controlled. We will follow.  Mikle Bosworth.D.

## 2019-12-12 ENCOUNTER — Telehealth: Payer: Self-pay

## 2019-12-12 LAB — HAPTOGLOBIN: Haptoglobin: <10 mg/dL — ABNORMAL LOW (ref 33–346)

## 2019-12-12 LAB — BASIC METABOLIC PANEL
BUN/Creatinine Ratio: 10 (ref 9–23)
BUN: 15 mg/dL (ref 6–24)
CO2: 19 mmol/L — ABNORMAL LOW (ref 20–29)
Calcium: 8.3 mg/dL — ABNORMAL LOW (ref 8.7–10.2)
Chloride: 83 mmol/L — ABNORMAL LOW (ref 96–106)
Creatinine, Ser: 1.56 mg/dL — ABNORMAL HIGH (ref 0.57–1.00)
GFR calc Af Amer: 42 mL/min/{1.73_m2} — ABNORMAL LOW (ref >59)
GFR calc non Af Amer: 37 mL/min/{1.73_m2} — ABNORMAL LOW (ref >59)
Glucose: 99 mg/dL (ref 65–99)
Potassium: 3.6 mmol/L (ref 3.5–5.2)
Sodium: 122 mmol/L — ABNORMAL LOW (ref 134–144)

## 2019-12-12 LAB — CBC WITH DIFFERENTIAL/PLATELET
Basophils Absolute: 0 10*3/uL (ref 0.0–0.2)
Basos: 0 %
EOS (ABSOLUTE): 0 10*3/uL (ref 0.0–0.4)
Eos: 0 %
Hematocrit: 23.1 % — ABNORMAL LOW (ref 34.0–46.6)
Hemoglobin: 8.5 g/dL — ABNORMAL LOW (ref 11.1–15.9)
Immature Grans (Abs): 0 10*3/uL (ref 0.0–0.1)
Immature Granulocytes: 0 %
Lymphocytes Absolute: 1 10*3/uL (ref 0.7–3.1)
Lymphs: 11 %
MCH: 38.8 pg — ABNORMAL HIGH (ref 26.6–33.0)
MCHC: 36.8 g/dL — ABNORMAL HIGH (ref 31.5–35.7)
MCV: 106 fL — ABNORMAL HIGH (ref 79–97)
Monocytes Absolute: 0.3 10*3/uL (ref 0.1–0.9)
Monocytes: 3 %
Neutrophils Absolute: 7.8 10*3/uL — ABNORMAL HIGH (ref 1.4–7.0)
Neutrophils: 86 %
Platelets: 340 10*3/uL (ref 150–450)
RBC: 2.19 x10E6/uL — CL (ref 3.77–5.28)
RDW: 14.5 % (ref 11.7–15.4)
WBC: 9.2 10*3/uL (ref 3.4–10.8)

## 2019-12-12 LAB — HEPATIC FUNCTION PANEL
ALT: 16 IU/L (ref 0–32)
AST: 43 IU/L — ABNORMAL HIGH (ref 0–40)
Albumin: 3 g/dL — ABNORMAL LOW (ref 3.8–4.9)
Alkaline Phosphatase: 175 IU/L — ABNORMAL HIGH (ref 39–117)
Bilirubin Total: 0.5 mg/dL (ref 0.0–1.2)
Bilirubin, Direct: 0.28 mg/dL (ref 0.00–0.40)
Total Protein: 6.1 g/dL (ref 6.0–8.5)

## 2019-12-12 LAB — RETICULOCYTES: Retic Ct Pct: 2.7 % — ABNORMAL HIGH (ref 0.6–2.6)

## 2019-12-12 NOTE — Telephone Encounter (Signed)
Lab results drawn yesterday reviewed with Dr. Lovena Le.  Per Dr. Lovena Le "she needs to go to hospital for symptomatic anemia and worsening renal failure".  Call placed to Pt.  Advised she should go to [facility] ([place]) or if she cannot get a ride she should call 911 for ambulance transport.  Pt indicates understanding.

## 2019-12-27 ENCOUNTER — Telehealth: Payer: Self-pay | Admitting: Cardiology

## 2019-12-27 NOTE — Telephone Encounter (Signed)
Per staff message from Willeen Cass, " Dr. Marlou Porch is her primary cardiologist. I would have her see Mr. [name] in 2 weeks and Dr. Marlou Porch in 2 months. GT" I have attempted to contact the patient 3 times to schedule follow-up appointments with no success. I left 3 voice messages.

## 2019-12-29 ENCOUNTER — Other Ambulatory Visit: Payer: Self-pay

## 2019-12-29 ENCOUNTER — Emergency Department (HOSPITAL_COMMUNITY): Payer: Self-pay

## 2019-12-29 ENCOUNTER — Observation Stay (HOSPITAL_COMMUNITY): Payer: Self-pay

## 2019-12-29 ENCOUNTER — Encounter (HOSPITAL_COMMUNITY): Payer: Self-pay

## 2019-12-29 ENCOUNTER — Emergency Department (HOSPITAL_COMMUNITY): Admission: EM | Admit: 2019-12-29 | Payer: Self-pay | Source: Home / Self Care

## 2019-12-29 ENCOUNTER — Inpatient Hospital Stay (HOSPITAL_COMMUNITY)
Admission: EM | Admit: 2019-12-29 | Discharge: 2020-01-01 | DRG: 392 | Disposition: A | Discharge status: Home / Self Care | Payer: Self-pay | Attending: Internal Medicine | Admitting: Internal Medicine

## 2019-12-29 DIAGNOSIS — Z87891 Personal history of nicotine dependence: Secondary | ICD-10-CM

## 2019-12-29 DIAGNOSIS — R000 Tachycardia, unspecified: Secondary | ICD-10-CM | POA: Diagnosis present

## 2019-12-29 DIAGNOSIS — A084 Viral intestinal infection, unspecified: Principal | ICD-10-CM | POA: Diagnosis present

## 2019-12-29 DIAGNOSIS — I340 Nonrheumatic mitral (valve) insufficiency: Secondary | ICD-10-CM | POA: Diagnosis present

## 2019-12-29 DIAGNOSIS — Z79899 Other long term (current) drug therapy: Secondary | ICD-10-CM

## 2019-12-29 DIAGNOSIS — D75839 Thrombocytosis, unspecified: Secondary | ICD-10-CM | POA: Diagnosis present

## 2019-12-29 DIAGNOSIS — D539 Nutritional anemia, unspecified: Secondary | ICD-10-CM | POA: Diagnosis present

## 2019-12-29 DIAGNOSIS — Z952 Presence of prosthetic heart valve: Secondary | ICD-10-CM

## 2019-12-29 DIAGNOSIS — I110 Hypertensive heart disease with heart failure: Secondary | ICD-10-CM | POA: Diagnosis present

## 2019-12-29 DIAGNOSIS — N289 Disorder of kidney and ureter, unspecified: Secondary | ICD-10-CM

## 2019-12-29 DIAGNOSIS — I071 Rheumatic tricuspid insufficiency: Secondary | ICD-10-CM | POA: Diagnosis present

## 2019-12-29 DIAGNOSIS — Z954 Presence of other heart-valve replacement: Secondary | ICD-10-CM

## 2019-12-29 DIAGNOSIS — D473 Essential (hemorrhagic) thrombocythemia: Secondary | ICD-10-CM | POA: Diagnosis present

## 2019-12-29 DIAGNOSIS — E538 Deficiency of other specified B group vitamins: Secondary | ICD-10-CM | POA: Diagnosis present

## 2019-12-29 DIAGNOSIS — Z20822 Contact with and (suspected) exposure to covid-19: Secondary | ICD-10-CM | POA: Diagnosis present

## 2019-12-29 DIAGNOSIS — E872 Acidosis, unspecified: Secondary | ICD-10-CM | POA: Diagnosis present

## 2019-12-29 DIAGNOSIS — N179 Acute kidney failure, unspecified: Secondary | ICD-10-CM | POA: Diagnosis present

## 2019-12-29 DIAGNOSIS — I6523 Occlusion and stenosis of bilateral carotid arteries: Secondary | ICD-10-CM | POA: Diagnosis present

## 2019-12-29 DIAGNOSIS — Z7982 Long term (current) use of aspirin: Secondary | ICD-10-CM

## 2019-12-29 DIAGNOSIS — F1210 Cannabis abuse, uncomplicated: Secondary | ICD-10-CM | POA: Diagnosis present

## 2019-12-29 DIAGNOSIS — Z885 Allergy status to narcotic agent status: Secondary | ICD-10-CM

## 2019-12-29 DIAGNOSIS — Z823 Family history of stroke: Secondary | ICD-10-CM

## 2019-12-29 DIAGNOSIS — E860 Dehydration: Secondary | ICD-10-CM | POA: Diagnosis present

## 2019-12-29 DIAGNOSIS — K219 Gastro-esophageal reflux disease without esophagitis: Secondary | ICD-10-CM | POA: Diagnosis present

## 2019-12-29 DIAGNOSIS — Z7901 Long term (current) use of anticoagulants: Secondary | ICD-10-CM

## 2019-12-29 DIAGNOSIS — E871 Hypo-osmolality and hyponatremia: Secondary | ICD-10-CM | POA: Diagnosis present

## 2019-12-29 DIAGNOSIS — Z9889 Other specified postprocedural states: Secondary | ICD-10-CM

## 2019-12-29 DIAGNOSIS — I5032 Chronic diastolic (congestive) heart failure: Secondary | ICD-10-CM | POA: Diagnosis present

## 2019-12-29 DIAGNOSIS — Z8673 Personal history of transient ischemic attack (TIA), and cerebral infarction without residual deficits: Secondary | ICD-10-CM

## 2019-12-29 DIAGNOSIS — Z9049 Acquired absence of other specified parts of digestive tract: Secondary | ICD-10-CM

## 2019-12-29 HISTORY — DX: Nutritional anemia, unspecified: D53.9

## 2019-12-29 HISTORY — DX: Deficiency of other specified B group vitamins: E53.8

## 2019-12-29 HISTORY — DX: Tachycardia, unspecified: R00.0

## 2019-12-29 HISTORY — DX: Transient cerebral ischemic attack, unspecified: G45.9

## 2019-12-29 LAB — CBC WITH DIFFERENTIAL/PLATELET
Abs Immature Granulocytes: 0.05 10*3/uL (ref 0.00–0.07)
Basophils Absolute: 0 10*3/uL (ref 0.0–0.1)
Basophils Relative: 0 %
Eosinophils Absolute: 0.1 10*3/uL (ref 0.0–0.5)
Eosinophils Relative: 1 %
HCT: 26.9 % — ABNORMAL LOW (ref 36.0–46.0)
Hemoglobin: 9.3 g/dL — ABNORMAL LOW (ref 12.0–15.0)
Immature Granulocytes: 1 %
Lymphocytes Relative: 13 %
Lymphs Abs: 1.2 10*3/uL (ref 0.7–4.0)
MCH: 40.4 pg — ABNORMAL HIGH (ref 26.0–34.0)
MCHC: 34.6 g/dL (ref 30.0–36.0)
MCV: 117 fL — ABNORMAL HIGH (ref 80.0–100.0)
Monocytes Absolute: 0.5 10*3/uL (ref 0.1–1.0)
Monocytes Relative: 5 %
Neutro Abs: 7.4 10*3/uL (ref 1.7–7.7)
Neutrophils Relative %: 80 %
Platelets: 427 10*3/uL — ABNORMAL HIGH (ref 150–400)
RBC: 2.3 MIL/uL — ABNORMAL LOW (ref 3.87–5.11)
RDW: 17.5 % — ABNORMAL HIGH (ref 11.5–15.5)
WBC: 9.3 10*3/uL (ref 4.0–10.5)
nRBC: 0 % (ref 0.0–0.2)

## 2019-12-29 LAB — COMPREHENSIVE METABOLIC PANEL
ALT: 22 U/L (ref 0–44)
AST: 40 U/L (ref 15–41)
Albumin: 2.5 g/dL — ABNORMAL LOW (ref 3.5–5.0)
Alkaline Phosphatase: 124 U/L (ref 38–126)
Anion gap: 14 (ref 5–15)
BUN: 9 mg/dL (ref 6–20)
CO2: 17 mmol/L — ABNORMAL LOW (ref 22–32)
Calcium: 8.4 mg/dL — ABNORMAL LOW (ref 8.9–10.3)
Chloride: 109 mmol/L (ref 98–111)
Creatinine, Ser: 2.17 mg/dL — ABNORMAL HIGH (ref 0.44–1.00)
GFR calc Af Amer: 28 mL/min — ABNORMAL LOW (ref >60)
GFR calc non Af Amer: 24 mL/min — ABNORMAL LOW (ref >60)
Glucose, Bld: 82 mg/dL (ref 70–99)
Potassium: 3.5 mmol/L (ref 3.5–5.1)
Sodium: 140 mmol/L (ref 135–145)
Total Bilirubin: 0.7 mg/dL (ref 0.3–1.2)
Total Protein: 5.8 g/dL — ABNORMAL LOW (ref 6.5–8.1)

## 2019-12-29 LAB — TROPONIN I (HIGH SENSITIVITY)
Troponin I (High Sensitivity): 11 ng/L (ref <18)
Troponin I (High Sensitivity): 11 ng/L (ref <18)

## 2019-12-29 LAB — LACTIC ACID, PLASMA
Lactic Acid, Venous: 3 mmol/L (ref 0.5–1.9)
Lactic Acid, Venous: 2.4 mmol/L (ref 0.5–1.9)

## 2019-12-29 LAB — PROTIME-INR
INR: 2.7 — ABNORMAL HIGH (ref 0.8–1.2)
Prothrombin Time: 28.3 seconds — ABNORMAL HIGH (ref 11.4–15.2)

## 2019-12-29 LAB — RESPIRATORY PANEL BY RT PCR (FLU A&B, COVID)
Influenza A by PCR: NEGATIVE
Influenza B by PCR: NEGATIVE
SARS Coronavirus 2 by RT PCR: NEGATIVE

## 2019-12-29 LAB — VITAMIN B12: Vitamin B-12: 516 pg/mL (ref 180–914)

## 2019-12-29 LAB — PHOSPHORUS: Phosphorus: 2.9 mg/dL (ref 2.5–4.6)

## 2019-12-29 LAB — TSH: TSH: 4.393 u[IU]/mL (ref 0.350–4.500)

## 2019-12-29 LAB — MAGNESIUM: Magnesium: 1.4 mg/dL — ABNORMAL LOW (ref 1.7–2.4)

## 2019-12-29 LAB — LIPASE, BLOOD: Lipase: 13 U/L (ref 11–51)

## 2019-12-29 LAB — BRAIN NATRIURETIC PEPTIDE: B Natriuretic Peptide: 44.5 pg/mL (ref 0.0–100.0)

## 2019-12-29 MED ORDER — LACTATED RINGERS IV BOLUS
500.0000 mL | Freq: Once | INTRAVENOUS | Status: AC
  Administered 2019-12-29: 500 mL via INTRAVENOUS

## 2019-12-29 MED ORDER — WARFARIN SODIUM 5 MG PO TABS: 5.0000 mg | ORAL_TABLET | ORAL | Status: DC

## 2019-12-29 MED ORDER — ASPIRIN EC 81 MG PO TBEC
81.0000 mg | DELAYED_RELEASE_TABLET | Freq: Every day | ORAL | Status: DC
  Administered 2019-12-30 – 2020-01-01 (×3): 81 mg via ORAL
  Filled 2019-12-29 (×3): qty 1

## 2019-12-29 MED ORDER — SODIUM CHLORIDE 0.9 % IV SOLN: INTRAVENOUS | Status: DC

## 2019-12-29 MED ORDER — ACETAMINOPHEN 325 MG PO TABS: 650.0000 mg | ORAL_TABLET | Freq: Four times a day (QID) | ORAL | Status: DC | PRN

## 2019-12-29 MED ORDER — WARFARIN - PHARMACIST DOSING INPATIENT: Freq: Every day | Status: DC

## 2019-12-29 MED ORDER — ONDANSETRON HCL 4 MG/2ML IJ SOLN: 4.0000 mg | Freq: Four times a day (QID) | INTRAMUSCULAR | Status: DC | PRN

## 2019-12-29 MED ORDER — PANTOPRAZOLE SODIUM 40 MG IV SOLR
40.0000 mg | Freq: Once | INTRAVENOUS | Status: AC
  Administered 2019-12-29: 12:00:00 40 mg via INTRAVENOUS
  Filled 2019-12-29: qty 40

## 2019-12-29 MED ORDER — WARFARIN SODIUM 5 MG PO TABS
5.0000 mg | ORAL_TABLET | Freq: Once | ORAL | Status: AC
  Administered 2019-12-29: 5 mg via ORAL
  Filled 2019-12-29: qty 1

## 2019-12-29 MED ORDER — ONDANSETRON HCL 4 MG PO TABS: 4.0000 mg | ORAL_TABLET | Freq: Four times a day (QID) | ORAL | Status: DC | PRN

## 2019-12-29 MED ORDER — ACETAMINOPHEN 650 MG RE SUPP: 650.0000 mg | Freq: Four times a day (QID) | RECTAL | Status: DC | PRN

## 2019-12-29 MED ORDER — LACTATED RINGERS IV BOLUS
500.0000 mL | Freq: Once | INTRAVENOUS | Status: AC
  Administered 2019-12-29: 12:00:00 500 mL via INTRAVENOUS

## 2019-12-29 NOTE — ED Provider Notes (Signed)
[facility] EMERGENCY DEPARTMENT Provider Note   CSN: [id number] Arrival date & time: 12/29/19  1017     History Chief Complaint  Patient presents with  . Anemia    Mary [name] is a 59 y.o. female.  HPI Patient reports that she has had increasing general weakness for the past week.  She reports she gets short of breath with minor activities.  She endorses dizziness with standing.  No passing out episodes.  She reports she just waits a little while and then she is able to do her activities.  She was sent to the emergency department reportedly by her cardiologist office for anemia.  Patient reports she has been anemic her whole life.  She does take Coumadin.  She denies she has had any bleeding.  She reports she has had vomiting over the past day but none today.  She reports she was vomiting quite a bit.  She never saw any blood or dark appearance.  He denies black or tarry appearing bowel movements.  No vaginal bleeding.  Patient denies swelling or pain in her legs.  She does endorse feeling very fatigued.  No fevers.    Past Medical History:  Diagnosis Date  . Acute diastolic heart failure ([place]) 03/15/2013  . Anemia   . Carotid stenosis    Carotid US 2/17: bilateral ICA 1-39% >> FU prn  . Childhood asthma   . Complete heart block ([place]) 05/28/2013   Post-op  . Epigastric hernia 200's  . GERD (gastroesophageal reflux disease)   . Heart murmur   . History of blood transfusion    "14 w/1st pregnancy; 2 w/last C-section" (03/15/2013)  . Hx of echocardiogram    a. Echo 4/16:  Mild focal basal septal hypertrophy, EF 60-65%, no RWMA, Mechanical MVR ok with mild central regurgitation and no perivalvular leak, small mobile density attached to valvular ring (1.5x1 cm) - post surgical changes vs SBE, mild LAE;   b. Echo 2/17:  Mild post wall LVH, EF 55-60%, no RWMA, mechanical MVR ok (mean 5 mmHg), normal RVSF  . Hypertension    no pcp   will go to [facility]  . Rheumatic mitral  stenosis with regurgitation 03/23/2013  . S/P mitral valve replacement with metallic valve [id number]   1mm Sorin Carbomedics mechanical prosthesis via right mini thoracotomy approach  . 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 Severe mitral regurgitation 04/22/2013  . Stroke ([place])   . SVT (supraventricular tachycardia) ([place]) 03/16/2013  . Tricuspid regurgitation     Patient Active Problem List   Diagnosis Date Noted  . GERD (gastroesophageal reflux disease)   . Lactic acid acidosis   . Marijuana abuse   . Macrocytic anemia 04/04/2019  . Intermittent complete heart block ([place])   . Syncope 03/28/2019  . Dehydration   . Volume depletion 05/24/2018  . Acute kidney injury ([place]) 05/23/2018  . Gastroenteritis 05/23/2018  . Hyponatremia 04/18/2017  . Orthostatic hypotension 01/11/2017  . Chronic hyponatremia 01/11/2017  . Elevated liver enzymes 01/11/2017  . Atypical chest pain 06/05/2016  . Palpitations 03/20/2016  . Sinus tachycardia 02/01/2016  . AKI (acute kidney injury) ([place]) 01/10/2016  . Subtherapeutic international normalized ratio (INR) 01/10/2016  . TIA (transient ischemic attack) 09/13/2015  . History of rheumatic heart disease 09/13/2015  . CVA (cerebral vascular accident) ([place]) 09/12/2015  . SOB (  shortness of breath) 02/24/2014  . First degree heart block 06/17/2013  . Weakness 06/16/2013  . (HFpEF) heart failure with preserved ejection fraction ([place]) 06/16/2013  . Anemia 06/16/2013  . Thrombocytosis ([place]) 06/16/2013  . History of bacterial endocarditis 06/16/2013  . Heart valve replaced by other means 06/10/2013  . Long term (current) use of anticoagulants 06/10/2013  . History of complete heart block 05/28/2013  . S/P mitral valve replacement with metallic valve [id number]  . S/P  minimally-invasive tricuspid valve repair 05/23/2013  . Femoral hernia 05/23/2013  . Mitral valve insufficiency 04/22/2013  . Mitral valve disorders(424.0) 04/22/2013  . Tricuspid regurgitation 04/22/2013  . 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 SVT (supraventricular tachycardia) ([place]) 03/16/2013    Past Surgical History:  Procedure Laterality Date  . APPENDECTOMY  [id number]  . CARDIAC CATHETERIZATION    . CARDIAC VALVE REPLACEMENT    . CESAREAN SECTION  1983  . CESAREAN SECTION WITH BILATERAL TUBAL LIGATION  1999  . FEMORAL HERNIA REPAIR Right 05/23/2013   Procedure: HERNIA REPAIR FEMORAL;  Surgeon: Rexene Alberts, MD;  Location: [facility];  Service: Open Heart Surgery;  Laterality: Right;  . INTRAOPERATIVE TRANSESOPHAGEAL ECHOCARDIOGRAM N/A 05/23/2013   Procedure: INTRAOPERATIVE TRANSESOPHAGEAL ECHOCARDIOGRAM;  Surgeon: Rexene Alberts, MD;  Location: [facility];  Service: Open Heart Surgery;  Laterality: N/A;  . LAPAROSCOPIC CHOLECYSTECTOMY  2003  . LEFT AND RIGHT HEART CATHETERIZATION WITH CORONARY ANGIOGRAM N/A 03/22/2013   Procedure: LEFT AND RIGHT HEART CATHETERIZATION WITH CORONARY ANGIOGRAM;  Surgeon: Burnell Blanks, MD;  Location[facility] C[facility] LAB;  Service: Cardiovascular;  Laterality: N/A;  . MINIMALLY INVASIVE TRICUSPID VALVE REPAIR Right 05/23/2013   Procedure: MINIMALLY INVASIVE TRICUSPID VALVE REPAIR;  Surgeon: Rexene Alberts, MD;  Location: [facility];  Service: Open Heart Surgery;  Laterality: Right;  . MITRAL VALVE REPLACEMENT N/A 05/23/2013   Procedure: MITRAL VALVE (MV) REPLACEMENT;  Surgeon: Rexene Alberts, MD;  Location: [facility];  Service: Open Heart Surgery;  Laterality: N/A;  . MULTIPLE EXTRACTIONS WITH ALVEOLOPLASTY N/A 04/04/2013   Procedure: Extraction of tooth #'s 1,8,9,13,14,15,23,24,25,26 with alveoloplasty and gross debridement of remaining teeth;  Surgeon: Lenn Cal, DDS;  Location: [facility];  Service: Oral Surgery;  Laterality: N/A;  . TEE WITHOUT  CARDIOVERSION N/A 03/18/2013   Procedure: TRANSESOPHAGEAL ECHOCARDIOGRAM (TEE);  Surgeon: Larey Dresser, MD;  Location: [facility];  Service: Cardiovascular;  Laterality: N/A;  . TEE WITHOUT CARDIOVERSION N/A 06/17/2013   Procedure: TRANSESOPHAGEAL ECHOCARDIOGRAM (TEE);  Surgeon: Lelon Perla, MD;  Location: [facility] ENDOSCOPY;  Service: Cardiovascular;  Laterality: N/A;  . TUBAL LIGATION  1999     OB History   No obstetric history on file.     Family History  Problem Relation Age of Onset  . Cancer Mother   . Stroke Father   . Sarcoidosis Sister   . Heart attack Neg Hx     Social History   Tobacco Use  . Smoking status: Former Smoker    Packs/day: 0.50    Years: 36.00    Pack years: 18.00    Types: Cigarettes    Quit date: 03/15/2013    Years since quitting: 6.7  . Smokeless tobacco: Never Used  Substance Use Topics  . Alcohol use: Yes    Comment: 01/11/2017  "glass of wine/month"  . Drug use: No    Home Medications Prior to Admission medications   Medication Sig Start Date End Date Taking? Authorizing Provider  acetaminophen (TYLENOL) 500 MG tablet  Take 1,000 mg by mouth every 6 (six) hours as needed for headache.   Yes [provider]  aspirin EC 81 MG tablet Take 1 tablet (81 mg total) by mouth daily. 08/26/13  Yes Larey Dresser, MD  fluticasone [facility]) 50 MCG/ACT nasal spray Place 1 spray into both nostrils daily as needed for allergies or rhinitis.   Yes [provider]  Hyprom-Naphaz-Polysorb-Zn Sulf (CLEAR EYES COMPLETE OP) Place 2 drops into both eyes daily as needed (red eyes).   Yes [provider]  warfarin (COUMADIN) 5 MG tablet Take 1 to 1.5 tablets daily as directed by Anticoagulation Clinic Patient taking differently: Take 5-7.5 mg by mouth See admin instructions. Take 1 to 1.5 tablets daily as directed by Anticoagulation Clinic on Mon, Wed, Friday taking 7.5mg  and all other days 5 mg tablet. 11/04/19  Yes Evans Lance, MD   famotidine (PEPCID) 20 MG tablet Take 1 tablet (20 mg total) by mouth 2 (two) times daily. Patient not taking: Reported on 12/29/2019 09/06/19   Richardson Dopp T, PA-C  loratadine (CLARITIN) 10 MG tablet Take 1 tablet (10 mg total) by mouth daily. Patient not taking: Reported on 12/29/2019 09/06/19   Richardson Dopp T, PA-C  ondansetron (ZOFRAN) 8 MG tablet Take 1 tablet (8 mg total) by mouth every 8 (eight) hours as needed for nausea or vomiting. Patient not taking: Reported on 12/29/2019 09/12/19   Argentina Donovan, PA-C  saccharomyces boulardii (FLORASTOR) 250 MG capsule Take 1 capsule (250 mg total) by mouth 2 (two) times daily. Patient not taking: Reported on 12/29/2019 12/07/18   Georgette Shell, MD    Allergies    Oxycodone  Review of Systems   Review of Systems 10 Systems reviewed and are negative for acute change except as noted in the HPI.  Physical Exam Updated Vital Signs BP 109/79   Pulse (!) 112   Resp 12   Ht 5\' 2"  (1.575 m)   Wt 55.3 kg   LMP 03/22/2013   SpO2 100%   BMI 22.31 kg/m   Physical Exam Constitutional:      Comments: Alert with clear mental status.  No respiratory distress.  Pale in appearance.  HENT:     Head: Normocephalic and atraumatic.  Eyes:     Extraocular Movements: Extraocular movements intact.  Cardiovascular:     Comments: Tachycardia.  No gross rub murmur gallop.  Monitor shows sinus rhythm in the 120s.  Normal sinus rhythm without ectopy.  Narrow complex. Pulmonary:     Effort: Pulmonary effort is normal.     Breath sounds: Normal breath sounds.  Abdominal:     General: There is no distension.     Palpations: Abdomen is soft.     Tenderness: There is no abdominal tenderness. There is no guarding.  Musculoskeletal:        General: No swelling or tenderness. Normal range of motion.     Right lower leg: No edema.     Left lower leg: No edema.     Comments: Distal pulses intact.  Extremities are warm and dry.  Skin:    General: Skin  is warm and dry.     Coloration: Skin is pale.  Neurological:     General: No focal deficit present.     Mental Status: She is oriented to person, place, and time.     Coordination: Coordination normal.  Psychiatric:        Mood and Affect: Mood normal.  ED Results / Procedures / Treatments   Labs (all labs ordered are listed, but only abnormal results are displayed) Labs Reviewed  LACTIC ACID, PLASMA - Abnormal; Notable for the following components:      Result Value   Lactic Acid, Venous 3.0 (*)    All other components within normal limits  LACTIC ACID, PLASMA - Abnormal; Notable for the following components:   Lactic Acid, Venous 2.4 (*)    All other components within normal limits  CBC WITH DIFFERENTIAL/PLATELET - Abnormal; Notable for the following components:   RBC 2.30 (*)    Hemoglobin 9.3 (*)    HCT 26.9 (*)    MCV 117.0 (*)    MCH 40.4 (*)    RDW 17.5 (*)    Platelets 427 (*)    All other components within normal limits  COMPREHENSIVE METABOLIC PANEL - Abnormal; Notable for the following components:   CO2 17 (*)    Creatinine, Ser 2.17 (*)    Calcium 8.4 (*)    Total Protein 5.8 (*)    Albumin 2.5 (*)    GFR calc non Af Amer 24 (*)    GFR calc Af Amer 28 (*)    All other components within normal limits  RESPIRATORY PANEL BY RT PCR (FLU A&B, COVID)  GI PATHOGEN PANEL BY PCR, STOOL  LIPASE, BLOOD  BRAIN NATRIURETIC PEPTIDE  URINALYSIS, ROUTINE W REFLEX MICROSCOPIC  PROTIME-INR  HIV ANTIBODY (ROUTINE TESTING W REFLEX)  MAGNESIUM  PHOSPHORUS  TSH  VITAMIN B12  FOLATE RBC  POC OCCULT BLOOD, ED  TYPE AND SCREEN  TROPONIN I (HIGH SENSITIVITY)  TROPONIN I (HIGH SENSITIVITY)    EKG EKG Interpretation  Date/Time:  Sunday December 29 2019 10:23:27 EDT Ventricular Rate:  130 PR Interval:    QRS Duration: 76 QT Interval:  317 QTC Calculation: 467 R Axis:   67 Text Interpretation: Sinus tachycardia Atrial premature complex LAE, consider biatrial  enlargement Low voltage, precordial leads Nonspecific T abnrm, anterolateral leads Confirmed by Charlesetta Shanks [phone]) on 12/29/2019 3:41:43 PM   Radiology DG Chest Port 1 View  Result Date: 12/29/2019 CLINICAL DATA:  Anemia with vomiting.  History of cardiac arrhythmia EXAM: PORTABLE CHEST 1 VIEW COMPARISON:  March 27, 2019 FINDINGS: Lungs are clear. Heart size and pulmonary vascular normal. Patient is status post mitral and tricuspid valve replacements. No adenopathy. No bone lesions. IMPRESSION: Status post tricuspid and mitral valve replacements. Heart size within normal limits. No edema or airspace opacity. No adenopathy. Electronically Signed   By: Lowella Grip III M.D.   On: 12/29/2019 11:22    Procedures Procedures (including critical care time)  Medications Ordered in ED Medications  0.9 %  sodium chloride infusion (has no administration in time range)  acetaminophen (TYLENOL) tablet 650 mg (has no administration in time range)    Or  acetaminophen (TYLENOL) suppository 650 mg (has no administration in time range)  ondansetron (ZOFRAN) tablet 4 mg (has no administration in time range)    Or  ondansetron (ZOFRAN) injection 4 mg (has no administration in time range)  pantoprazole (PROTONIX) injection 40 mg (40 mg Intravenous Given 12/29/19 1150)  lactated ringers bolus 500 mL (0 mLs Intravenous Stopped 12/29/19 1329)  lactated ringers bolus 500 mL (0 mLs Intravenous Stopped 12/29/19 1519)    ED Course  I have reviewed the triage vital signs and the nursing notes.  Pertinent labs & imaging results that were available during my care of the patient were reviewed by me  and considered in my medical decision making (see chart for details).    MDM Rules/Calculators/A&P                     Consult: Dr. Reginal Lutes for admission.  [facility] hospitalist.  Patient presents with tachycardia and hypotension.  She does describe vomiting and poor oral intake for the past couple of days.  Reportedly  patient was told she is anemic.  Patient has a chronic anemia.  No bleeding source.  Hemoglobin is stable.  Patient does however have AKI.  Heart rate does seem to be improving with rehydration.  Patient identified also feeling short of breath.  At this time, no sign of volume overload.  Chest x-ray does not show any volume overload or focal opacities 2 sets of troponins normal.  Patient's mental status is clear.  Will plan for admission for AKI with tachycardia and hypotension. Final Clinical Impression(s) / ED Diagnoses Final diagnoses:  Dehydration  AKI (acute kidney injury) [facility])    Rx / [place] Orders ED Discharge Orders    None       Charlesetta Shanks, MD 12/29/19 1544

## 2019-12-29 NOTE — ED Notes (Signed)
Bedside commode placed at pt's bedside. 

## 2019-12-29 NOTE — Progress Notes (Signed)
ANTICOAGULATION CONSULT NOTE - Initial Consult  Pharmacy Consult for warfarin Indication: Mech MVR  Allergies  Allergen Reactions  . Oxycodone Itching    Patient Measurements: Height: 5\' 2"  (157.5 cm) Weight: 55.3 kg (122 lb) IBW/kg (Calculated) : 50.1  Vital Signs: Temp Source: Oral (04/04 1112) BP: 109/79 (04/04 1245) Pulse Rate: 112 (04/04 1245)  Labs: Recent Labs    12/29/19 1136 12/29/19 1351  HGB 9.3*  --   HCT 26.9*  --   PLT 427*  --   CREATININE  --  2.17*  TROPONINIHS 11 11    Estimated Creatinine Clearance: 22.6 mL/min (A) (by C-G formula based on SCr of 2.17 mg/dL (H)).   Medical History: Past Medical History:  Diagnosis Date  . Acute diastolic heart failure ([place]) 03/15/2013  . Anemia   . Carotid stenosis    Carotid US 2/17: bilateral ICA 1-39% >> FU prn  . Childhood asthma   . Complete heart block ([place]) 05/28/2013   Post-op  . Epigastric hernia 200's  . GERD (gastroesophageal reflux disease)   . Heart murmur   . History of blood transfusion    "14 w/1st pregnancy; 2 w/last C-section" (03/15/2013)  . Hx of echocardiogram    a. Echo 4/16:  Mild focal basal septal hypertrophy, EF 60-65%, no RWMA, Mechanical MVR ok with mild central regurgitation and no perivalvular leak, small mobile density attached to valvular ring (1.5x1 cm) - post surgical changes vs SBE, mild LAE;   b. Echo 2/17:  Mild post wall LVH, EF 55-60%, no RWMA, mechanical MVR ok (mean 5 mmHg), normal RVSF  . Hypertension    no pcp   will go to [facility]  . 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 S/P mitral valve replacement with metallic valve [id number]   37mm Sorin Carbomedics mechanical prosthesis via right mini thoracotomy approach  . 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   Complex valvuloplasty including Cor-matrix ECM patch augmentation of anterior and lateral leaflets with 34mm Edwards mc3 ring annuloplasty via right mini-thoracotomy approach  . Severe mitral  regurgitation 04/22/2013  . Stroke ([place])   . SVT (supraventricular tachycardia) ([place]) 03/16/2013  . Tricuspid regurgitation     Assessment: 20 YOF presenting with hypotenstion/tachycardia.  On warfarin PTA for hx of mechanical valve.  Hx macrocytic anemia, no bleeding reported by pt or found in ED.  Last dose taken 4/3. INR on admission therapeutic at 2.7  PTA dosing: 5mg  daily except Friday takes 7.5mg   Goal of Therapy:  INR 2.5-3.5 Monitor platelets by anticoagulation protocol: Yes   Plan:  Give warfarin 5mg  PO x 1 today Daily INR, s/s bleeding  Bertis Ruddy, PharmD Clinical Pharmacist ED Pharmacist Phone # [phone] 12/29/2019 6:00 PM

## 2019-12-29 NOTE — H&P (Signed)
History and Physical    Mary [name] [id number] DOB: 1961/02/17 DOA: 12/29/2019  PCP: Antony Blackbird, MD  Patient coming from: Home I have personally briefly reviewed patient's old medical records in [place]  Chief Complaint: Vomiting, diarrhea, lightheadedness since 3 days  HPI: Mary [name] is a 59 y.o. female with medical history significant of mitral valve regurgitation status post mechanical valve replacement, tricuspid regurgitation status post tricuspid valve repair-she is on Coumadin, GERD, chronic diastolic congestive heart failure, transient complete heart block, macrocytic anemia, marijuana abuse presents to emergency department due to vomiting, diarrhea, lightheadedness since 3 days.  Patient tells pain that she is having nonbloody diarrhea and vomiting since 3 days.  She vomits 3-4 times per day, clear.her diarrhea is yellow in color, non-foul-smelling, 2-3 episodes per day, mucousy.  Reports that her symptoms are associated with dizziness, lightheadedness and weakness with standing.  No episodes of passing outs or head trauma.  Has chronic palpitation due to valve replacement.  She is compliant with her Coumadin as prescribed.  She tells me that her cardiologist called her on Thursday and was told to go to ER for the concern of low H&H.  She called EMS this morning and came to the ER for further evaluation and management.  Patient was tachycardic and hypotensive with EMS.  Denies fever, chills, cough, congestion, recent hospitalization, headache, chest pain, leg swelling, dysuria, hematuria, decreased appetite or weight loss.  She lives alone at home.  Denies smoking, drinks alcohol occasionally and uses marijuana every day.  ED Course: Upon arrival to ED: Patient tachycardic, blood pressure soft, troponin x2 -, lipase: WNL.  She is afebrile with no leukocytosis, CBC shows macrocytic anemia. H&H 9.3/26.9.  Platelet: 427, BMP: WNL, lactic acid 3.0 trended down to  2.4.  CMP shows AKI.  POC occult blood, UA, COVID-19, PT/INR: Pending, chest x-ray negative for acute findings.  [facility] hospitalist consulted for admission for dehydration, AKI and lactic acidosis.  Review of Systems: As per HPI otherwise negative.    Past Medical History:  Diagnosis Date  . Acute diastolic heart failure ([place]) 03/15/2013  . Anemia   . Carotid stenosis    Carotid US 2/17: bilateral ICA 1-39% >> FU prn  . Childhood asthma   . Complete heart block ([place]) 05/28/2013   Post-op  . Epigastric hernia 200's  . GERD (gastroesophageal reflux disease)   . Heart murmur   . History of blood transfusion    "14 w/1st pregnancy; 2 w/last C-section" (03/15/2013)  . Hx of echocardiogram    a. Echo 4/16:  Mild focal basal septal hypertrophy, EF 60-65%, no RWMA, Mechanical MVR ok with mild central regurgitation and no perivalvular leak, small mobile density attached to valvular ring (1.5x1 cm) - post surgical changes vs SBE, mild LAE;   b. Echo 2/17:  Mild post wall LVH, EF 55-60%, no RWMA, mechanical MVR ok (mean 5 mmHg), normal RVSF  . Hypertension    no pcp   will go to [facility]  . 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 S/P mitral valve replacement with metallic valve [id number]   64mm Sorin Carbomedics mechanical prosthesis via right mini thoracotomy approach  . 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   Complex valvuloplasty including Cor-matrix ECM patch augmentation of anterior and lateral leaflets with 38mm Edwards mc3 ring annuloplasty via right mini-thoracotomy approach  . Severe mitral regurgitation 04/22/2013  . Stroke ([place])   . SVT (supraventricular tachycardia) ([place]) 03/16/2013  . Tricuspid regurgitation  Past Surgical History:  Procedure Laterality Date  . APPENDECTOMY  [id number]  . CARDIAC CATHETERIZATION    . CARDIAC VALVE REPLACEMENT    . CESAREAN SECTION  1983  . CESAREAN SECTION WITH BILATERAL TUBAL LIGATION  1999  . FEMORAL HERNIA REPAIR Right 05/23/2013    Procedure: HERNIA REPAIR FEMORAL;  Surgeon: Rexene Alberts, MD;  Location: [place];  Service: Open Heart Surgery;  Laterality: Right;  . INTRAOPERATIVE TRANSESOPHAGEAL ECHOCARDIOGRAM N/A 05/23/2013   Procedure: INTRAOPERATIVE TRANSESOPHAGEAL ECHOCARDIOGRAM;  Surgeon: Rexene Alberts, MD;  Location: [place];  Service: Open Heart Surgery;  Laterality: N/A;  . LAPAROSCOPIC CHOLECYSTECTOMY  2003  . LEFT AND RIGHT HEART CATHETERIZATION WITH CORONARY ANGIOGRAM N/A 03/22/2013   Procedure: LEFT AND RIGHT HEART CATHETERIZATION WITH CORONARY ANGIOGRAM;  Surgeon: Burnell Blanks, MD;  Location: [facility] CATH LAB;  Service: Cardiovascular;  Laterality: N/A;  . MINIMALLY INVASIVE TRICUSPID VALVE REPAIR Right 05/23/2013   Procedure: MINIMALLY INVASIVE TRICUSPID VALVE REPAIR;  Surgeon: Rexene Alberts, MD;  Location[place];  Service: Open Heart Surgery;  Laterality: Right;  . MITRAL VALVE REPLACEMENT N/A 05/23/2013   Procedure: MITRAL VALVE (MV) REPLACEMENT;  Surgeon: Rexene Alberts, MD;  Location: [place];  Service: Open Heart Surgery;  Laterality: N/A;  . MULTIPLE EXTRACTIONS WITH ALVEOLOPLASTY N/A 04/04/2013   Procedure: Extraction of tooth #'s 1,8,9,13,14,15,23,24,25,26 with alveoloplasty and gross debridement of remaining teeth;  Surgeon: Lenn Cal, DDS;  Location: [place];  Service: Oral Surgery;  Laterality: N/A;  . TEE WITHOUT CARDIOVERSION N/A 03/18/2013   Procedure: TRANSESOPHAGEAL ECHOCARDIOGRAM (TEE);  Surgeon: Larey Dresser, MD;  Location: [place];  Service: Cardiovascular;  Laterality: N/A;  . TEE WITHOUT CARDIOVERSION N/A 06/17/2013   Procedure: TRANSESOPHAGEAL ECHOCARDIOGRAM (TEE);  Surgeon: Lelon Perla, MD;  Location: [facility] ENDOSCOPY;  Service: Cardiovascular;  Laterality: N/A;  . [place]     reports that she quit smoking about 6 years ago. Her smoking use included cigarettes. She has a 18.00 pack-year smoking history. She has never used smokeless tobacco. She reports current  alcohol use. She reports that she does not use drugs.  Allergies  Allergen Reactions  . Oxycodone Itching    Family History  Problem Relation Age of Onset  . Cancer Mother   . Stroke Father   . Sarcoidosis Sister   . Heart attack Neg Hx     Prior to Admission medications   Medication Sig Start Date End Date Taking? Authorizing Provider  acetaminophen (TYLENOL) 500 MG tablet Take 1,000 mg by mouth every 6 (six) hours as needed for headache.   Yes [provider]  aspirin EC 81 MG tablet Take 1 tablet (81 mg total) by mouth daily. 08/26/13  Yes Larey Dresser, MD  fluticasone [facility]) 50 MCG/ACT nasal spray Place 1 spray into both nostrils daily as needed for allergies or rhinitis.   Yes [provider]  Hyprom-Naphaz-Polysorb-Zn Sulf (CLEAR EYES COMPLETE OP) Place 2 drops into both eyes daily as needed (red eyes).   Yes [provider]  warfarin (COUMADIN) 5 MG tablet Take 1 to 1.5 tablets daily as directed by Anticoagulation Clinic Patient taking differently: Take 5-7.5 mg by mouth See admin instructions. Take 1 to 1.5 tablets daily as directed by Anticoagulation Clinic on Mon, Wed, Friday taking 7.5mg  and all other days 5 mg tablet. 11/04/19  Yes Evans Lance, MD  famotidine (PEPCID) 20 MG tablet Take 1 tablet (20 mg total) by mouth 2 (two) times  daily. Patient not taking: Reported on 12/29/2019 09/06/19   Richardson Dopp T, PA-C  loratadine (CLARITIN) 10 MG tablet Take 1 tablet (10 mg total) by mouth daily. Patient not taking: Reported on 12/29/2019 09/06/19   Richardson Dopp T, PA-C  ondansetron (ZOFRAN) 8 MG tablet Take 1 tablet (8 mg total) by mouth every 8 (eight) hours as needed for nausea or vomiting. Patient not taking: Reported on 12/29/2019 09/12/19   Argentina Donovan, PA-C  saccharomyces boulardii (FLORASTOR) 250 MG capsule Take 1 capsule (250 mg total) by mouth 2 (two) times daily. Patient not taking: Reported on 12/29/2019 12/07/18   Georgette Shell,  MD    Physical Exam: Vitals:   12/29/19 1115 12/29/19 1145 12/29/19 1215 12/29/19 1245  BP: 106/86 108/71 122/82 109/79  Pulse: (!) 123 (!) 125 (!) 113 (!) 112  Resp: 18 (!) 21 14 12   TempSrc:      SpO2: 100% 100% 100% 100%  Weight:      Height:        Constitutional: NAD, calm, comfortable, communicating well, on room air, Eyes: PERRL, lids and conjunctivae: Pale ENMT: Mucous membranes are moist. Posterior pharynx clear of any exudate or lesions.Normal dentition.  Neck: normal, supple, no masses, no thyromegaly Respiratory: clear to auscultation bilaterally, no wheezing, no crackles. Normal respiratory effort. No accessory muscle use.  Cardiovascular: Regular rate and rhythm, no murmurs / rubs / gallops. No extremity edema. 2+ pedal pulses. No carotid bruits.  Abdomen: no tenderness, no masses palpated. No hepatosplenomegaly. Bowel sounds positive.  Musculoskeletal: no clubbing / cyanosis. No joint deformity upper and lower extremities. Good ROM, no contractures. Normal muscle tone.  Skin: no rashes, lesions, ulcers. No induration Neurologic: CN 2-12 grossly intact. Sensation intact, DTR normal. Strength 5/5 in all 4.  Psychiatric: Normal judgment and insight. Alert and oriented x 3. Normal mood.    Labs on Admission: I have personally reviewed following labs and imaging studies  CBC: Recent Labs  Lab 12/29/19 1136  WBC 9.3  NEUTROABS 7.4  HGB 9.3*  HCT 26.9*  MCV 117.0*  PLT [id number]*   Basic Metabolic Panel: Recent Labs  Lab 12/29/19 1351  NA 140  K 3.5  CL 109  CO2 17*  GLUCOSE 82  BUN 9  CREATININE 2.17*  CALCIUM 8.4*   GFR: Estimated Creatinine Clearance: 22.6 mL/min (A) (by C-G formula based on SCr of 2.17 mg/dL (H)). Liver Function Tests: Recent Labs  Lab 12/29/19 1351  AST 40  ALT 22  ALKPHOS 124  BILITOT 0.7  PROT 5.8*  ALBUMIN 2.5*   Recent Labs  Lab 12/29/19 1136  LIPASE 13   No results for input(s): AMMONIA in the last 168  hours. Coagulation Profile: No results for input(s): INR, PROTIME in the last 168 hours. Cardiac Enzymes: No results for input(s): CKTOTAL, CKMB, CKMBINDEX, TROPONINI in the last 168 hours. BNP (last 3 results) No results for input(s): PROBNP in the last 8760 hours. HbA1C: No results for input(s): HGBA1C in the last 72 hours. CBG: No results for input(s): GLUCAP in the last 168 hours. Lipid Profile: No results for input(s): CHOL, HDL, LDLCALC, TRIG, CHOLHDL, LDLDIRECT in the last 72 hours. Thyroid Function Tests: No results for input(s): TSH, T4TOTAL, FREET4, T3FREE, THYROIDAB in the last 72 hours. Anemia Panel: No results for input(s): VITAMINB12, FOLATE, FERRITIN, TIBC, IRON, RETICCTPCT in the last 72 hours. Urine analysis:    Component Value Date/Time   COLORURINE YELLOW 03/28/2019 0040   APPEARANCEUR CLEAR 03/28/2019 0040  LABSPEC 1.003 (L) 03/28/2019 0040   PHURINE 6.0 03/28/2019 0040   GLUCOSEU NEGATIVE 03/28/2019 0040   HGBUR LARGE (A) 03/28/2019 0040   BILIRUBINUR NEGATIVE 03/28/2019 0040   KETONESUR NEGATIVE 03/28/2019 0040   PROTEINUR NEGATIVE 03/28/2019 0040   UROBILINOGEN 0.2 06/04/2015 1533   NITRITE NEGATIVE 03/28/2019 0040   LEUKOCYTESUR NEGATIVE 03/28/2019 0040    Radiological Exams on Admission: DG Chest Port 1 View  Result Date: 12/29/2019 CLINICAL DATA:  Anemia with vomiting.  History of cardiac arrhythmia EXAM: PORTABLE CHEST 1 VIEW COMPARISON:  March 27, 2019 FINDINGS: Lungs are clear. Heart size and pulmonary vascular normal. Patient is status post mitral and tricuspid valve replacements. No adenopathy. No bone lesions. IMPRESSION: Status post tricuspid and mitral valve replacements. Heart size within normal limits. No edema or airspace opacity. No adenopathy. Electronically Signed   By: Lowella Grip III M.D.   On: 12/29/2019 11:22    EKG: Independently reviewed. Sinus tachycardia. Atrial premature complex.  No ST elevation or depression  noted.  Assessment/Plan Principal Problem:   Dehydration Active Problems:   Mitral valve insufficiency   Tricuspid regurgitation   S/P mitral valve replacement with metallic valve   S/P minimally-invasive tricuspid valve repair   Thrombocytosis (HCC)   Sinus tachycardia   Acute kidney injury (HCC)   Macrocytic anemia   GERD (gastroesophageal reflux disease)   Lactic acid acidosis   Marijuana abuse   Vomiting and diarrhea: -Likely secondary to acute viral gastroenteritis.  She is afebrile with no leukocytosis.  She is tachycardic and hypotensive upon arrival likely secondary to volume loss.  Lipase: WNL.  POC occult blood, UA, GI pathogen panel: Pending. -Lactic acid: 3.0 trended down to 2.4.  COVID-19 pending -Admit patient under observation. -Continue IV fluids.   -Zofran as needed for nausea and vomiting.  We will keep her n.p.o. for now.  AKI: Likely secondary to dehydration due to nausea and vomiting -Continue IV fluids.  Avoid nephrotoxic medication. -We will get renal ultrasound. -BMP daily.  Macrocytic anemia: -H&H is stable.  No signs of bleeding. -Check B12, folate, TSH. -Monitor H&H closely.  Transfuse as needed.  Lactic acidosis: Likely secondary to dehydration -Continue IV fluids  History of heart valve replacement with mechanical valve and is status post minimally invasive tricuspid valve repair: -Coumadin per pharmacy.  Monitor PT/INR -On telemetry  Thrombocytosis: Likely reactive -Continue to monitor  Marijuana abuse: Counseled about cessation   DVT prophylaxis: Coumadin/SCD/TED Code Status: Full code Family Communication: None present at bedside.  Plan of care discussed with patient in length and he verbalized understanding and agreed with it. Disposition Plan: Likely home tomorrow Consults called: None Admission status: Observation   Mckinley Jewel MD [facility] Pager [phone]  If 7PM-7AM, please contact  night-coverage [email or web address] Password [facility]  12/29/2019, 3:19 PM

## 2019-12-29 NOTE — Progress Notes (Signed)
Patient refused labs x2

## 2019-12-29 NOTE — ED Triage Notes (Signed)
Pt bib [facility] w/ c/o anemia. Pt called by cardiologist on Thurs and told to come to ED for anemia. Pt tachycardic and hypotensive w/ EMS.  BP 84/64 HR 120

## 2019-12-29 NOTE — ED Notes (Signed)
Pt has not urinated since Friday or Saturday. Pt has only experienced diarrhea.

## 2019-12-30 ENCOUNTER — Encounter (HOSPITAL_COMMUNITY): Payer: Self-pay | Admitting: Internal Medicine

## 2019-12-30 DIAGNOSIS — N179 Acute kidney failure, unspecified: Secondary | ICD-10-CM

## 2019-12-30 DIAGNOSIS — F1210 Cannabis abuse, uncomplicated: Secondary | ICD-10-CM

## 2019-12-30 DIAGNOSIS — E860 Dehydration: Secondary | ICD-10-CM

## 2019-12-30 DIAGNOSIS — E872 Acidosis: Secondary | ICD-10-CM

## 2019-12-30 DIAGNOSIS — Z954 Presence of other heart-valve replacement: Secondary | ICD-10-CM

## 2019-12-30 LAB — URINALYSIS, ROUTINE W REFLEX MICROSCOPIC
Bilirubin Urine: NEGATIVE
Glucose, UA: NEGATIVE mg/dL
Ketones, ur: NEGATIVE mg/dL
Leukocytes,Ua: NEGATIVE
Nitrite: NEGATIVE
Protein, ur: NEGATIVE mg/dL
Specific Gravity, Urine: 1.011 (ref 1.005–1.030)
pH: 5 (ref 5.0–8.0)

## 2019-12-30 LAB — RAPID URINE DRUG SCREEN, HOSP PERFORMED
Amphetamines: NOT DETECTED
Barbiturates: NOT DETECTED
Benzodiazepines: NOT DETECTED
Cocaine: NOT DETECTED
Opiates: NOT DETECTED
Tetrahydrocannabinol: POSITIVE — AB

## 2019-12-30 LAB — BASIC METABOLIC PANEL
Anion gap: 12 (ref 5–15)
BUN: 8 mg/dL (ref 6–20)
CO2: 18 mmol/L — ABNORMAL LOW (ref 22–32)
Calcium: 7.7 mg/dL — ABNORMAL LOW (ref 8.9–10.3)
Chloride: 111 mmol/L (ref 98–111)
Creatinine, Ser: 1.8 mg/dL — ABNORMAL HIGH (ref 0.44–1.00)
GFR calc Af Amer: 36 mL/min — ABNORMAL LOW (ref >60)
GFR calc non Af Amer: 31 mL/min — ABNORMAL LOW (ref >60)
Glucose, Bld: 81 mg/dL (ref 70–99)
Potassium: 3.5 mmol/L (ref 3.5–5.1)
Sodium: 141 mmol/L (ref 135–145)

## 2019-12-30 LAB — PROTIME-INR
INR: 3.1 — ABNORMAL HIGH (ref 0.8–1.2)
Prothrombin Time: 31.8 seconds — ABNORMAL HIGH (ref 11.4–15.2)

## 2019-12-30 LAB — CBC
HCT: 21.4 % — ABNORMAL LOW (ref 36.0–46.0)
Hemoglobin: 7.3 g/dL — ABNORMAL LOW (ref 12.0–15.0)
MCH: 39.5 pg — ABNORMAL HIGH (ref 26.0–34.0)
MCHC: 34.1 g/dL (ref 30.0–36.0)
MCV: 115.7 fL — ABNORMAL HIGH (ref 80.0–100.0)
Platelets: 389 10*3/uL (ref 150–400)
RBC: 1.85 MIL/uL — ABNORMAL LOW (ref 3.87–5.11)
RDW: 16.8 % — ABNORMAL HIGH (ref 11.5–15.5)
WBC: 6.5 10*3/uL (ref 4.0–10.5)
nRBC: 0 % (ref 0.0–0.2)

## 2019-12-30 LAB — HIV ANTIBODY (ROUTINE TESTING W REFLEX): HIV Screen 4th Generation wRfx: NONREACTIVE

## 2019-12-30 MED ORDER — MAGNESIUM SULFATE 2 GM/50ML IV SOLN
2.0000 g | Freq: Once | INTRAVENOUS | Status: AC
  Administered 2019-12-30: 2 g via INTRAVENOUS
  Filled 2019-12-30: qty 50

## 2019-12-30 MED ORDER — METOPROLOL TARTRATE 5 MG/5ML IV SOLN
5.0000 mg | Freq: Once | INTRAVENOUS | Status: AC
  Administered 2019-12-30: 5 mg via INTRAVENOUS
  Filled 2019-12-30: qty 5

## 2019-12-30 MED ORDER — TEMAZEPAM 7.5 MG PO CAPS
7.5000 mg | ORAL_CAPSULE | Freq: Every evening | ORAL | Status: DC | PRN
  Administered 2019-12-30 – 2019-12-31 (×2): 7.5 mg via ORAL
  Filled 2019-12-30 (×3): qty 1

## 2019-12-30 MED ORDER — WARFARIN SODIUM 5 MG PO TABS
5.0000 mg | ORAL_TABLET | Freq: Once | ORAL | Status: AC
  Administered 2019-12-30: 5 mg via ORAL
  Filled 2019-12-30: qty 1

## 2019-12-30 MED ORDER — POTASSIUM CHLORIDE CRYS ER 20 MEQ PO TBCR
20.0000 meq | EXTENDED_RELEASE_TABLET | Freq: Once | ORAL | Status: AC
  Administered 2019-12-30: 20 meq via ORAL
  Filled 2019-12-30: qty 1

## 2019-12-30 NOTE — Progress Notes (Signed)
PROGRESS NOTE    Mary [name]  [id number] DOB: [birth year] DOA: 12/29/2019 PCP: Antony Blackbird, MD    Brief Narrative:  Mary [name] is a 59 y.o. 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  She says she started having nonbloody diarrhea and vomiting since 3 days. Diarrhea is yellow in color, non-foul-smelling, 2-3 episodes per day, mucousy.  Reports that her symptoms are associated with dizziness, lightheadedness and weakness with standing and some cramping abdominal pain. Denies syncopal episodes.  Has chronic palpitation due to valve replacement.  She is compliant with her Coumadin as prescribed.  She says her cardiologist called her on Thursday and was told to go to ER for the concern of low H&H.  In the ED she was noted to be tachycardic and hypotensive with EMS. Denies smoking, drinks alcohol occasionally and uses marijuana every day.  ED Course: Upon arrival to ED: Patient tachycardic, blood pressure soft, troponin x2 -, lipase: WNL.    Afebrile with no leukocytosis, CBC shows macrocytic anemia. H&H 9.3/26.9.  Platelet: 427, BMP: WNL, lactic acid 3.0 trended down to 2.4.  CMP shows AKI.  POC occult blood. Chest x-ray negative for acute findings.  COVID-19 negative.  Patient was admitted for admission for dehydration, AKI and lactic acidosis.   Assessment & Plan:   Principal Problem:   Dehydration Active Problems:   Mitral valve insufficiency   Tricuspid regurgitation   S/P mitral valve replacement with metallic valve   S/P minimally-invasive tricuspid valve repair   Thrombocytosis (HCC)   Sinus tachycardia   Acute kidney injury (HCC)   Macrocytic anemia  GERD (gastroesophageal reflux disease)   Lactic acid acidosis   Marijuana abuse  Vomiting and diarrhea: -Likely secondary to acute viral gastroenteritis?  She is afebrile with no leukocytosis.  Tachycardic and hypotensive upon arrival likely secondary to volume loss.  Lipase: WNL.  POC occult blood, GI pathogen panel: Pending. UA negative. -Lactic acid: 3.0 trended down to 2.4.   -Hemoglobin today 7.3.  Continue to monitor.  Stool for occult blood pending. -Continue IV fluids.   -Zofran as needed for nausea and vomiting.   AKI: Likely secondary to dehydration due to nausea and vomiting -Continue IV fluids.  Avoid nephrotoxic medication. -Renal ultrasound does not show any acute findings -Monitor BMP daily.  Macrocytic anemia: -Hemoglobin this morning 7.3. -TSH 4.393.  Vitamin B12 516. -Monitor H&H closely.  Transfuse as needed.  Lactic acidosis: Likely secondary to dehydration -Continue IV fluids  History of heart valve replacement with mechanical valve and is status post minimally invasive tricuspid valve repair: -Coumadin per pharmacy.  Monitor PT/INR -On telemetry  -If hemoglobin drops any further then we may need to hold Coumadin.  Consulted cardiology to help.  Thrombocytosis: Likely reactive -Continue to monitor  Marijuana abuse: Counseled about cessation   DVT prophylaxis: Coumadin/SCD/TED Code Status: Full code Family Communication: None present at bedside.  Plan of care discussed with patient at length Disposition Plan:  Once symptoms improve Consults called: Cardiology Antimicrobials:   None    Subjective: She previously had abdominal cramping which she says is improving.  Still having some diarrhea.  Has nausea but denies having any vomiting.  Objective: Vitals:   12/29/19 2136 12/30/19 0255 12/30/19 0620 12/30/19 1024  BP: 130/76 105/63  121/78 (!) 134/91  Pulse: (!) 109 100 98 (!) 110  Resp: 16 18 18 18   Temp: 98.1 F (36.7 C) 98.6 F (37 C)  97.9 F (36.6 C) 97.6 F (36.4 C)  TempSrc: Oral Oral Oral Oral  SpO2: 100% 100% 100% 100%  Weight:      Height:        Intake/Output Summary (Last 24 hours) at 12/30/2019 1730 Last data filed at 12/30/2019 1600 Gross per 24 hour  Intake 2018.41 ml  Output 100 ml  Net 1918.41 ml   Filed Weights   12/29/19 1700 12/29/19 1800 12/29/19 1900  Weight: 55.2 kg 51.5 kg 53.3 kg    Examination: General exam: Appears calm and comfortable  Respiratory system: Clear to auscultation. Respiratory effort normal. Cardiovascular system: S1 & S2 heard. No murmur. Gastrointestinal system: Abdomen is nondistended, soft and nontender. No organomegaly or masses felt. Normal bowel sounds heard. Central nervous system: Alert and oriented. No focal neurological deficits. Extremities: No edema, Symmetric 5 x 5 power. Skin: No rashes, lesions or ulcers Psychiatry: Judgement and insight appear normal. Mood & affect appropriate.     Data Reviewed: I have personally reviewed following labs and imaging studies  CBC: Recent Labs  Lab 12/29/19 1136 12/30/19 0603  WBC 9.3 6.5  NEUTROABS 7.4  --   HGB 9.3* 7.3*  HCT 26.9* 21.4*  MCV 117.0* 115.7*  PLT 427* [id number]   Basic Metabolic Panel: Recent Labs  Lab 12/29/19 1351 12/29/19 1907 12/30/19 0603  NA 140  --  141  K 3.5  --  3.5  CL 109  --  111  CO2 17*  --  18*  GLUCOSE 82  --  81  BUN 9  --  8  CREATININE 2.17*  --  1.80*  CALCIUM 8.4*  --  7.7*  MG  --  1.4*  --   PHOS  --  2.9  --    GFR: Estimated Creatinine Clearance: 27.3 mL/min (A) (by C-G formula based on SCr of 1.8 mg/dL (H)). Liver Function Tests: Recent Labs  Lab 12/29/19 1351  AST 40  ALT 22  ALKPHOS 124  BILITOT 0.7  PROT 5.8*  ALBUMIN 2.5*   Recent Labs  Lab 12/29/19 1136  LIPASE 13   No results for input(s): AMMONIA in the last 168 hours. Coagulation Profile: Recent Labs  Lab 12/29/19 1953 12/30/19 0603  INR 2.7* 3.1*   Cardiac Enzymes: No results for  input(s): CKTOTAL, CKMB, CKMBINDEX, TROPONINI in the last 168 hours. BNP (last 3 results) No results for input(s): PROBNP in the last 8760 hours. HbA1C: No results for input(s): HGBA1C in the last 72 hours. CBG: No results for input(s): GLUCAP in the last 168 hours. Lipid Profile: No results for input(s): CHOL, HDL, LDLCALC, TRIG, CHOLHDL, LDLDIRECT in the last 72 hours. Thyroid Function Tests: Recent Labs    12/29/19 1953  TSH 4.393   Anemia Panel: Recent Labs    12/29/19 1907  VITAMINB12 516   Sepsis Labs: Recent Labs  Lab 12/29/19 1153 12/29/19 1351  LATICACIDVEN 3.0* 2.4*    Recent Results (from the past 240 hour(s))  Respiratory Panel by RT PCR (Flu A&B, Covid) - Nasopharyngeal Swab     Status: None   Collection Time: 12/29/19  3:26 PM   Specimen: Nasopharyngeal Swab  Result Value Ref Range Status   SARS Coronavirus 2 by RT PCR NEGATIVE NEGATIVE Final    Comment: (NOTE) SARS-CoV-2 target nucleic acids are NOT DETECTED. The SARS-CoV-2  RNA is generally detectable in upper respiratoy specimens during the acute phase of infection. The lowest concentration of SARS-CoV-2 viral copies this assay can detect is 131 copies/mL. A negative result does not preclude SARS-Cov-2 infection and should not be used as the sole basis for treatment or other patient management decisions. A negative result may occur with  improper specimen collection/handling, submission of specimen other than nasopharyngeal swab, presence of viral mutation(s) within the areas targeted by this assay, and inadequate number of viral copies (<131 copies/mL). A negative result must be combined with clinical observations, patient history, and epidemiological information. The expected result is Negative. Fact Sheet for Patients:  PinkCheek.be Fact Sheet for Healthcare Providers:  GravelBags.it This test is not yet ap proved or cleared by the [place] FDA and  has been authorized for detection and/or diagnosis of SARS-CoV-2 by FDA under an Emergency Use Authorization (EUA). This EUA will remain  in effect (meaning this test can be used) for the duration of the COVID-19 declaration under Section 564(b)(1) of the Act, 21 U.S.C. section 360bbb-3(b)(1), unless the authorization is terminated or revoked sooner.    Influenza A by PCR NEGATIVE NEGATIVE Final   Influenza B by PCR NEGATIVE NEGATIVE Final    Comment: (NOTE) The Xpert Xpress SARS-CoV-2/FLU/RSV assay is intended as an aid in  the diagnosis of influenza from Nasopharyngeal swab specimens and  should not be used as a sole basis for treatment. Nasal washings and  aspirates are unacceptable for Xpert Xpress SARS-CoV-2/FLU/RSV  testing. Fact Sheet for Patients: PinkCheek.be Fact Sheet for Healthcare Providers: GravelBags.it This test is not yet approved or cleared by the [place] FDA and  has been authorized for detection and/or diagnosis of SARS-CoV-2 by  FDA under an Emergency Use Authorization (EUA). This EUA will remain  in effect (meaning this test can be used) for the duration of the  Covid-19 declaration under Section 564(b)(1) of the Act, 21  U.S.C. section 360bbb-3(b)(1), unless the authorization is  terminated or revoked. Performed at [facility], [place] [address], [place], [place] [address]          Radiology Studies: US RENAL  Result Date: 12/29/2019 CLINICAL DATA:  Decreased renal function.  No urine output. EXAM: RENAL / URINARY TRACT ULTRASOUND COMPLETE COMPARISON:  None. FINDINGS: Right Kidney: Renal measurements: 9.4 x 3.5 x 4.1 cm = volume: 71 mL . Echogenicity within normal limits. No mass or hydronephrosis visualized. Left Kidney: Renal measurements: 8.4 x 4.7 x 4.7 cm = volume: 98 mL. Echogenicity within normal limits. No mass or hydronephrosis visualized. Bladder: Bladder is  decompressed. Other: None. IMPRESSION: Kidneys appear normal.  No hydronephrosis. Electronically Signed   By: Franki Cabot M.D.   On: 12/29/2019 16:04   DG Chest Port 1 View  Result Date: 12/29/2019 CLINICAL DATA:  Anemia with vomiting.  History of cardiac arrhythmia EXAM: PORTABLE CHEST 1 VIEW COMPARISON:  March 27, 2019 FINDINGS: Lungs are clear. Heart size and pulmonary vascular normal. Patient is status post mitral and tricuspid valve replacements. No adenopathy. No bone lesions. IMPRESSION: Status post tricuspid and mitral valve replacements. Heart size within normal limits. No edema or airspace opacity. No adenopathy. Electronically Signed   By: Lowella Grip III M.D.   On: 12/29/2019 11:22        Scheduled Meds: . aspirin EC  81 mg Oral Daily  . Warfarin - Pharmacist Dosing Inpatient   Does not apply q1600   Continuous Infusions: . sodium chloride 100  mL/hr at 12/29/19 2009     LOS: 0 days    Yaakov Guthrie, MD [facility]   To contact the attending provider between 7A-7P or the covering provider during after hours 7P-7A, please log into the web site [email or web address] and access using universal [place] password for that web site. If you do not have the password, please call the hospital operator.  12/30/2019, 5:30 PM

## 2019-12-30 NOTE — Consult Note (Addendum)
Cardiology Consultation:   Patient ID: MIKKI MORGANS MRN: [id number]; DOB: [birth year]  Admit date: 12/29/2019 Date of Consult: 12/30/2019  Primary Care Provider: Antony Blackbird, MD Primary Cardiologist: Candee Furbish, MD  Primary Electrophysiologist:  Cristopher Peru, MD    Patient Profile:   ALANA BOZZELLI is a 59 y.o. 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 Barth Kirks.  History of Present Illness:   She was previously followed by Dr. Aundra Dubin then Dr. Saunders Revel after Dr. Aundra Dubin transitioned to the AHF clinic. She has more recently been followed by Dr. Lovena Le with EP and Dr. Marlou Porch. Ms. Ulatowski has a history of mitral valve disease and tricuspid valve disease in 2014 as above. She was admitted around that time with fever, cough, new murmur, and was found to have severe MR and mild-moderate TR. There was initial suggestion of vegetation on anterior mitral leaflet imaging but blood cultures remained negative although obtained after antibiotics were started. She was treated with 4 weeks of antibiotics. TEE showed severe mitral regurgitation with findings more consistent with rheumatic mitral valve disease. There was no clear vegetation identified. Pre-op cath showed no significant CAD. Post-op course was notable for brief transient afib in the OR, and post-op complete heart block after which conduction recovered without need for PPM at that time. In 03/2018, she was felt to be symptomatic and had intermittent CHB during the day. She was seen by EP and PPM was recommended. However, she never called back to schedule this. She was then admitted 03/2019 with syncope. She was found to have  anemia with Hgb downtrending from 12-13 to 7.8, felt due to B12 deficiency (FOBT negative at that time). She was seen byDr. Lovena Le for evaluation. He favored ILR implantation.Her insurance would not cover this. Therefore, she has not undergone ILR implantation. Her case was sent to the social worker in our system. It appears the patient was sent the assistance fund application several times but did not complete it as requested. She also has had lapses in compliance with coumadin follow-up which SW helped arrange transportation. She has also had sinus tachycardia which was managed conservatively given history of heart block. At last OV 12/11/19 she was complaining of severe fatigue. She was tachycardic with soft blood pressure. Her labwork showed AKI (Cr 1.56, new), severe hyponatremia of 122, and Hgb of 8.5. She was advised to go to the ED but does not appear she did so. She reports she was worried she would be evicted from her  Multiple attempts were made to reach out to the patient but went unanswered. Dr. Lovena Le did not think her symptoms were related to heart rhythm so he recommended holding off PPM.  She presented to the hospital with worsening weakness. She also described vomiting and poor oral intake x 3 days felt due to acute viral GE. She was found to have continued anemia with Hgb of 9.3 trending down 7.3 with fluids, worsening AKI with Cr now up to 2.17, and lactic acidosis. INR is 2.7 -> 3.1, Covid negative, troponins neg x2 and BNP wnl. FOBT is pending. UA also with large Hgb. She reports lifelong history of dizziness and SOB, but denies chest pain or  full syncope. She is currently awaiting dinner and feeling better. Earlier today she had an episode of what appears to be an atrial tachycardia with HR in the 140s for which she received 5mg  IV metoprolol with resolution. She states she could feel it but says she feels this all the time since she has a lifelong history of elevated HR (predominantly  sinus tach per notes).   Past Medical History:  Diagnosis Date  . Acute diastolic heart failure ([place]) 03/15/2013  . Anemia   . B12 deficiency   . Carotid stenosis    Carotid US 2/17: bilateral ICA 1-39% >> FU prn  . Childhood asthma   . Complete heart block ([place])   . Epigastric hernia 200's  . GERD (gastroesophageal reflux disease)   . Heart murmur   . History of blood transfusion    "14 w/1st pregnancy; 2 w/last C-section" (03/15/2013)  . Hx of echocardiogram    a. Echo 4/16:  Mild focal basal septal hypertrophy, EF 60-65%, no RWMA, Mechanical MVR ok with mild central regurgitation and no perivalvular leak, small mobile density attached to valvular ring (1.5x1 cm) - post surgical changes vs SBE, mild LAE;   b. Echo 2/17:  Mild post wall LVH, EF 55-60%, no RWMA, mechanical MVR ok (mean 5 mmHg), normal RVSF  . Hypertension    no pcp   will go to [facility]  . Macrocytic anemia   . 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 S/P mitral valve replacement with metallic valve [id number]   40mm Sorin Carbomedics mechanical prosthesis via right mini thoracotomy approach  . 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 Severe mitral regurgitation 04/22/2013  . Sinus tachycardia   . SVT (supraventricular tachycardia) ([place]) 03/16/2013  . TIA (transient ischemic attack)   . Tricuspid regurgitation     Past Surgical History:  Procedure Laterality Date  . APPENDECTOMY  [id number]  . CARDIAC CATHETERIZATION    . CARDIAC VALVE REPLACEMENT    . CESAREAN SECTION  1983  . CESAREAN SECTION WITH BILATERAL TUBAL LIGATION  1999  . FEMORAL HERNIA REPAIR Right 05/23/2013   Procedure: HERNIA REPAIR FEMORAL;  Surgeon: Rexene Alberts, MD;  Location: [place];  Service: Open Heart Surgery;  Laterality: Right;  . INTRAOPERATIVE  TRANSESOPHAGEAL ECHOCARDIOGRAM N/A 05/23/2013   Procedure: INTRAOPERATIVE TRANSESOPHAGEAL ECHOCARDIOGRAM;  Surgeon: Rexene Alberts, MD;  Location: [place];  Service: Open Heart Surgery;  Laterality: N/A;  . LAPAROSCOPIC CHOLECYSTECTOMY  2003  . LEFT AND RIGHT HEART CATHETERIZATION WITH CORONARY ANGIOGRAM N/A 03/22/2013   Procedure: LEFT AND RIGHT HEART CATHETERIZATION WITH CORONARY ANGIOGRAM;  Surgeon: Burnell Blanks, MD;  Location: [facility] C[facility] LAB;  Service: Cardiovascular;  Laterality: N/A;  . MINIMALLY INVASIVE TRICUSPID VALVE REPAIR Right 05/23/2013   Procedure: MINIMALLY INVASIVE TRICUSPID VALVE REPAIR;  Surgeon: Rexene Alberts, MD;  Location: [place];  Service: Open Heart Surgery;  Laterality: Right;  . MITRAL VALVE REPLACEMENT N/A 05/23/2013   Procedure: MITRAL VALVE (MV) REPLACEMENT;  Surgeon: Rexene Alberts, MD;  Location: [place];  Service: Open Heart Surgery;  Laterality: N/A;  . MULTIPLE EXTRACTIONS WITH ALVEOLOPLASTY N/A 04/04/2013   Procedure: Extraction of tooth #'s 1,8,9,13,14,15,23,24,25,26 with alveoloplasty and gross debridement of remaining teeth;  Surgeon: Lenn Cal, DDS;  Location: [place];  Service: Oral Surgery;  Laterality: N/A;  . TEE WITHOUT CARDIOVERSION  N/A 03/18/2013   Procedure: TRANSESOPHAGEAL ECHOCARDIOGRAM (TEE);  Surgeon: Larey Dresser, MD;  Location: [place];  Service: Cardiovascular;  Laterality: N/A;  . TEE WITHOUT CARDIOVERSION N/A 06/17/2013   Procedure: TRANSESOPHAGEAL ECHOCARDIOGRAM (TEE);  Surgeon: Lelon Perla, MD;  Location[facility] ENDOSCOPY;  Service: Cardiovascular;  Laterality: N/A;  . TUBAL LIGATION  1999     Home Medications:  Prior to Admission medications   Medication Sig Start Date End Date Taking? Authorizing Provider  acetaminophen (TYLENOL) 500 MG tablet Take 1,000 mg by mouth every 6 (six) hours as needed for headache.   Yes [provider]  aspirin EC 81 MG tablet Take 1 tablet (81 mg total) by mouth daily. 08/26/13  Yes  Larey Dresser, MD  fluticasone [facility]) 50 MCG/ACT nasal spray Place 1 spray into both nostrils daily as needed for allergies or rhinitis.   Yes [provider]  Hyprom-Naphaz-Polysorb-Zn Sulf (CLEAR EYES COMPLETE OP) Place 2 drops into both eyes daily as needed (red eyes).   Yes [provider]  warfarin (COUMADIN) 5 MG tablet Take 1 to 1.5 tablets daily as directed by Anticoagulation Clinic Patient taking differently: Take 5-7.5 mg by mouth See admin instructions. Take 1 to 1.5 tablets daily as directed by Anticoagulation Clinic on Mon, Wed, Friday taking 7.5mg  and all other days 5 mg tablet. 11/04/19  Yes Evans Lance, MD  famotidine (PEPCID) 20 MG tablet Take 1 tablet (20 mg total) by mouth 2 (two) times daily. Patient not taking: Reported on 12/29/2019 09/06/19   Richardson Dopp T, PA-C  loratadine (CLARITIN) 10 MG tablet Take 1 tablet (10 mg total) by mouth daily. Patient not taking: Reported on 12/29/2019 09/06/19   Richardson Dopp T, PA-C  ondansetron (ZOFRAN) 8 MG tablet Take 1 tablet (8 mg total) by mouth every 8 (eight) hours as needed for nausea or vomiting. Patient not taking: Reported on 12/29/2019 09/12/19   Argentina Donovan, PA-C  saccharomyces boulardii (FLORASTOR) 250 MG capsule Take 1 capsule (250 mg total) by mouth 2 (two) times daily. Patient not taking: Reported on 12/29/2019 12/07/18   Georgette Shell, MD    Inpatient Medications: Scheduled Meds: . aspirin EC  81 mg Oral Daily  . Warfarin - Pharmacist Dosing Inpatient   Does not apply q1600   Continuous Infusions: . sodium chloride 100 mL/hr at 12/29/19 2009   PRN Meds: acetaminophen **OR** acetaminophen, ondansetron **OR** ondansetron (ZOFRAN) IV  Allergies:    Allergies  Allergen Reactions  . Oxycodone Itching    Social History:   Social History   Socioeconomic History  . Marital status: Single    Spouse name: Not on file  . Number of children: Not on file  . Years of education: Not on  file  . Highest education level: Not on file  Occupational History  . Not on file  Tobacco Use  . Smoking status: Former Smoker    Packs/day: 0.50    Years: 36.00    Pack years: 18.00    Types: Cigarettes    Quit date: 03/15/2013    Years since quitting: 6.7  . Smokeless tobacco: Never Used  Substance and Sexual Activity  . Alcohol use: Yes    Comment: 01/11/2017  "glass of wine/month"  . Drug use: No  . Sexual activity: Never  Other Topics Concern  . Not on file  Social History Narrative  . Not on file   Social Determinants of Health   Financial Resource Strain:   .  Difficulty of Paying Living Expenses:   Food Insecurity:   . Worried About Charity fundraiser in the Last Year:   . Arboriculturist in the Last Year:   Transportation Needs:   . Film/video editor (Medical):   [name] Lack of Transportation (Non-Medical):   Physical Activity:   . Days of Exercise per Week:   . Minutes of Exercise per Session:   Stress:   . Feeling of Stress :   Social Connections:   . Frequency of Communication with Friends and Family:   . Frequency of Social Gatherings with Friends and Family:   . Attends Religious Services:   . Active Member of Clubs or Organizations:   . Attends Archivist Meetings:   [name] Marital Status:   Intimate Partner Violence:   . Fear of Current or Ex-Partner:   . Emotionally Abused:   [name] Physically Abused:   . Sexually Abused:     Family History:   Family History  Problem Relation Age of Onset  . Cancer Mother   . Stroke Father   . Sarcoidosis Sister   . Heart attack Neg Hx      ROS:  Please see the history of present illness.  All other ROS reviewed and negative.     Physical Exam/Data:   Vitals:   12/29/19 2136 12/30/19 0255 12/30/19 0620 12/30/19 1024  BP: 130/76 105/63 121/78 (!) 134/91  Pulse: (!) 109 100 98 (!) 110  Resp: 16 18 18 18   Temp: 98.1 F (36.7 C) 98.6 F (37 C) 97.9 F (36.6 C) 97.6 F (36.4 C)  TempSrc: Oral Oral  Oral Oral  SpO2: 100% 100% 100% 100%  Weight:      Height:        Intake/Output Summary (Last 24 hours) at 12/30/2019 1647 Last data filed at 12/30/2019 1300 Gross per 24 hour  Intake 2018.41 ml  Output 100 ml  Net 1918.41 ml   Last 3 Weights 12/29/2019 12/29/2019 12/29/2019  Weight (lbs) 117 lb 8.1 oz 113 lb 8 oz 121 lb 11.1 oz  Weight (kg) 53.3 kg 51.483 kg 55.2 kg     Body mass index is 21.49 kg/m.  Vital Signs. BP (!) 134/91 (BP Location: Left Arm)   Pulse (!) 110   Temp 97.6 F (36.4 C) (Oral)   Resp 18   Ht 5\' 2"  (1.575 m)   Wt 53.3 kg   LMP 03/22/2013   SpO2 100%   BMI 21.49 kg/m  General: Well developed, well nourished AAF in no acute distress Head: Normocephalic, atraumatic, sclera non-icteric, no xanthomas, nares are without discharge. Neck: Negative for carotid bruits. JVP not elevated. Lungs: Clear bilaterally to auscultation without wheezes, rales, or rhonchi. Breathing is unlabored. Heart: RRR S1 S2 without murmurs, rubs, or gallops.  Abdomen: Soft, non-tender, non-distended with normoactive bowel sounds. No rebound/guarding. Extremities: No clubbing or cyanosis. No edema. Distal pedal pulses are 2+ and equal bilaterally. Neuro: Alert and oriented X 3. Moves all extremities spontaneously. Psych:  Responds to questions appropriately with a normal affect.   EKG:  The EKG was personally reviewed and demonstrates: sinus tach 130bpm, diffuse mild ST depression I, II, III, avF, V2-V6  Telemetry:  Telemetry was personally reviewed and demonstrates: NSR/sinus tach with episode of narrow complex tachycardia (abrupt rhythm change) lasting about 40 minutes starting before 11am   Relevant CV Studies: 2D Echo 03/06/19 1. The left ventricle has hyperdynamic systolic function, with an  ejection fraction of >  65%. The cavity size was normal. Left ventricular  diastolic function could not be evaluated due to nondiagnostic images. No  evidence of left ventricular regional  wall  motion abnormalities.  2. The right ventricle has normal systolic function. The cavity was  normal. There is no increase in right ventricular wall thickness.  3. S/P 59mm Sorin Carbomedics mechanical MVR of the mitral valve leaflet.  Doppler interrogation of the MVR was not performed. Cannot assess MR due  to valve shadowing from prosthesis.  4. S/P TV repair with annuloplasty ring wiht trivial TR  5. The aortic valve is tricuspid. Mild sclerosis of the aortic valve.  Aortic valve regurgitation was not assessed by color flow Doppler.   Carotid duplex 05/2016 1-39% soft bilateral plaque  Event Monitor 03/2018  Normal sinus rhythm and sinus tachycardia with average heart rate 109 bpm. Heart rate ranged from 27 to 171 bpm.  Intermittent complete heart block with slow ventricular response at 47 bpm during the daytime.  SVT at 171 bpm  Occasional PVCs and wide-complex tachycardia up to 5 beats       Laboratory Data:  High Sensitivity Troponin:   Recent Labs  Lab 2020/01/13 1136 01-13-20 1351  TROPONINIHS 11 11     Chemistry Recent Labs  Lab 01/13/2020 1351 12/30/19 0603  NA 140 141  K 3.5 3.5  CL 109 111  CO2 17* 18*  GLUCOSE 82 81  BUN 9 8  CREATININE 2.17* 1.80*  CALCIUM 8.4* 7.7*  GFRNONAA 24* 31*  GFRAA 28* 36*  ANIONGAP 14 12    Recent Labs  Lab 2020/01/13 1351  PROT 5.8*  ALBUMIN 2.5*  AST 40  ALT 22  ALKPHOS 124  BILITOT 0.7   Hematology Recent Labs  Lab Jan 13, 2020 1136 12/30/19 0603  WBC 9.3 6.5  RBC 2.30* 1.85*  HGB 9.3* 7.3*  HCT 26.9* 21.4*  MCV 117.0* 115.7*  MCH 40.4* 39.5*  MCHC 34.6 34.1  RDW 17.5* 16.8*  PLT 427* 389   BNP Recent Labs  Lab January 13, 2020 1136  BNP 44.5    DDimer No results for input(s): DDIMER in the last 168 hours.   Radiology/Studies:  US RENAL  Result Date: 13-Jan-2020 CLINICAL DATA:  Decreased renal function.  No urine output. EXAM: RENAL / URINARY TRACT ULTRASOUND COMPLETE COMPARISON:  None. FINDINGS: Right  Kidney: Renal measurements: 9.4 x 3.5 x 4.1 cm = volume: 71 mL . Echogenicity within normal limits. No mass or hydronephrosis visualized. Left Kidney: Renal measurements: 8.4 x 4.7 x 4.7 cm = volume: 98 mL. Echogenicity within normal limits. No mass or hydronephrosis visualized. Bladder: Bladder is decompressed. Other: None. IMPRESSION: Kidneys appear normal.  No hydronephrosis. Electronically Signed   By: Franki Cabot M.D.   On: 01/13/2020 16:04   DG Chest Port 1 View  Result Date: Jan 13, 2020 CLINICAL DATA:  Anemia with vomiting.  History of cardiac arrhythmia EXAM: PORTABLE CHEST 1 VIEW COMPARISON:  March 27, 2019 FINDINGS: Lungs are clear. Heart size and pulmonary vascular normal. Patient is status post mitral and tricuspid valve replacements. No adenopathy. No bone lesions. IMPRESSION: Status post tricuspid and mitral valve replacements. Heart size within normal limits. No edema or airspace opacity. No adenopathy. Electronically Signed   By: Lowella Grip III M.D.   On: 01-13-2020 11:22    Assessment and Plan:   1. Subacute worsening acute kidney injury with lactic acidosis - the patient presented in 11/2019 with similar complaints and was noted to have elevated Cr at that  time, 1.56. She did not seek care at that time. She now presents with acute worsening of this in the context of presumed viral GE. Consider nephrology eval since initial rise in creatinine occurred without specific trigger of dehydration - question whether there is an additional process at play given her anemia and renal insufficiency.  2. Persistent anemia - Hgb in 03/2019 was down to the 8-9 range felt due to B12 deficiency, down to 7.3 today. Symptomatically stable today. FOBT pending collection. Previously negative in July. Pt denies any melena, BRBPR, or any other overt signs of bleeding. UA + Hgb. Need to monitor closely in context of ongoing anticoagulation/aspirin. Haptoglobin was not elevated in 11/2019. Further management  per IM.  3. Mitral valve replacement and TV repair - we will obtain f/u echocardiogram to exclude any structural component that would contribute to anemia, also eval for any structural changes given atrial arrhythmia.  4. Tachycardia (superimposed on history of intermittent CHB) - chronically patient has history of sinus tachycardia but had brief episode of what appears to be atrial tach for about 40 minutes earlier today. This resolved with IV metoprolol. Traditionally she has not been felt to be a candidate for AVN blockers superimposed on history of intermittent transient complete heart block which has been managed conservatively. EKG shows diffuse subtle ST depression possibly exacerbated by metabolic stressors. hsTroponins are normal. Will f/u EKG In AM to trend. Follow on telemetry for now as her acute metabolic issues are treated - if this recurs will need EP to weigh in for tachy/brady syndrome. TSH OK 4/4. Will also obtain UDS for completeness. K 3.5 -> will give 18meq x 1, Mg 1.4 - see below. Keep K 4.0 or greater and Mg 2.0 or greater.  5. Hypomagnesemia - suspect incited by diarrhea. Do not see repletion ordered. Will give 2g mag sulfate over 4 hours. F/u labs in AM.  For questions or updates, please contact [place] Please consult [email or web address] for contact info under    Signed, Charlie Pitter, PA-C  12/30/2019 4:47 PM   Patient seen and examined with Melina Copa, PA-C.  Agree as above, with the following exceptions and changes as noted below. She feels well and is in excellent spirits. She has no chest pain or significant SOB currently, and tells me she finally came to the ER because of 2-3 days of watery, nonbloody diarrhea. Gen: NAD, CV: RRR, no murmurs, Crisp valve closure sound at S1 Lungs: clear, Abd: soft, Extrem: Warm, well perfused, no edema, Neuro/Psych: alert and oriented x 3, normal mood and affect. All available labs, radiology testing, previous records reviewed. Haptoglobin  was negative by report, and no definite MR by exam. Smear appears to be unremarkable as well. Would be concerned for hemolysis with a perivalvular regurgitant jet with anemia, the valve sounds as if it is opening and closing appropriately, less concerning for thrombus formation or pannus ingrowth. We will obtain an echocardiogram to exclude a source of regurgitant jet that could contribute to hemolysis and anemia. Would avoid interruptions in anticoagulation unless there is evidence of frank bleeding. If coumadin is held, heparin IV will need to be used.   Elouise Munroe, MD

## 2019-12-30 NOTE — Progress Notes (Signed)
Patient having sustain tachy 130-140 beats and MD was notified and with orders. Med given to patient. Denies of any discomforts.

## 2019-12-30 NOTE — Plan of Care (Signed)
  Problem: Education: Goal: Knowledge of General Education information will improve Description: Including pain rating scale, medication(s)/side effects and non-pharmacologic comfort measures Outcome: Progressing   Problem: Clinical Measurements: Goal: Diagnostic test results will improve Outcome: Progressing   

## 2019-12-30 NOTE — Progress Notes (Signed)
ANTICOAGULATION CONSULT NOTE - Initial Consult  Pharmacy Consult for warfarin Indication: Mech MVR  Allergies  Allergen Reactions  . Oxycodone Itching    Patient Measurements: Height: 5\' 2"  (157.5 cm) Weight: 53.3 kg (117 lb 8.1 oz) IBW/kg (Calculated) : 50.1  Vital Signs: Temp: 97.9 F (36.6 C) (04/05 0620) Temp Source: Oral (04/05 0620) BP: 121/78 (04/05 0620) Pulse Rate: 98 (04/05 0620)  Labs: Recent Labs    12/29/19 1136 12/29/19 1351 12/29/19 1953 12/30/19 0603  HGB 9.3*  --   --  7.3*  HCT 26.9*  --   --  21.4*  PLT 427*  --   --  389  LABPROT  --   --  28.3* 31.8*  INR  --   --  2.7* 3.1*  CREATININE  --  2.17*  --  1.80*  TROPONINIHS 11 11  --   --     Estimated Creatinine Clearance: 27.3 mL/min (A) (by C-G formula based on SCr of 1.8 mg/dL (H)).   Medical History: Past Medical History:  Diagnosis Date  . Acute diastolic heart failure ([place]) 03/15/2013  . Anemia   . Carotid stenosis    Carotid US 2/17: bilateral ICA 1-39% >> FU prn  . Childhood asthma   . Complete heart block ([place]) 05/28/2013   Post-op  . Epigastric hernia 200's  . GERD (gastroesophageal reflux disease)   . Heart murmur   . History of blood transfusion    "14 w/1st pregnancy; 2 w/last C-section" (03/15/2013)  . Hx of echocardiogram    a. Echo 4/16:  Mild focal basal septal hypertrophy, EF 60-65%, no RWMA, Mechanical MVR ok with mild central regurgitation and no perivalvular leak, small mobile density attached to valvular ring (1.5x1 cm) - post surgical changes vs SBE, mild LAE;   b. Echo 2/17:  Mild post wall LVH, EF 55-60%, no RWMA, mechanical MVR ok (mean 5 mmHg), normal RVSF  . Hypertension    no pcp   will go to [facility]  . 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 S/P mitral valve replacement with metallic valve 123XX123   40mm Sorin Carbomedics mechanical prosthesis via right mini thoracotomy approach  . 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   Complex valvuloplasty  including Cor-matrix ECM patch augmentation of anterior and lateral leaflets with 33mm Edwards mc3 ring annuloplasty via right mini-thoracotomy approach  . Severe mitral regurgitation 04/22/2013  . Stroke ([place])   . SVT (supraventricular tachycardia) ([place]) 03/16/2013  . Tricuspid regurgitation     Assessment: 4 YOF presenting with hypotenstion/tachycardia.  On warfarin PTA for hx of mechanical valve.  Hx macrocytic anemia, no bleeding reported by pt or found in ED.  Last dose taken 4/3. INR on admission therapeutic at 2.7 and 3.1 today  PTA dosing: 5mg  daily except Friday takes 7.5mg   Goal of Therapy:  INR 2.5-3.5 Monitor platelets by anticoagulation protocol: Yes   Plan:  Give warfarin 5mg  PO x 1 today Daily INR, s/s bleeding  Nikalas Bramel A. Levada Dy, PharmD, BCPS, FNKF Clinical Pharmacist [place] Please utilize Amion for appropriate phone number to reach the unit pharmacist ([place])   12/30/2019 8:08 AM

## 2019-12-31 ENCOUNTER — Inpatient Hospital Stay (HOSPITAL_COMMUNITY): Payer: Self-pay

## 2019-12-31 DIAGNOSIS — I471 Supraventricular tachycardia: Secondary | ICD-10-CM

## 2019-12-31 LAB — GI PATHOGEN PANEL BY PCR, STOOL

## 2019-12-31 LAB — CBC
HCT: 21.1 % — ABNORMAL LOW (ref 36.0–46.0)
Hemoglobin: 6.9 g/dL — CL (ref 12.0–15.0)
MCH: 38.5 pg — ABNORMAL HIGH (ref 26.0–34.0)
MCHC: 32.7 g/dL (ref 30.0–36.0)
MCV: 117.9 fL — ABNORMAL HIGH (ref 80.0–100.0)
Platelets: 341 10*3/uL (ref 150–400)
RBC: 1.79 MIL/uL — ABNORMAL LOW (ref 3.87–5.11)
RDW: 17.2 % — ABNORMAL HIGH (ref 11.5–15.5)
WBC: 5.9 10*3/uL (ref 4.0–10.5)
nRBC: 0 % (ref 0.0–0.2)

## 2019-12-31 LAB — MAGNESIUM: Magnesium: 1.7 mg/dL (ref 1.7–2.4)

## 2019-12-31 LAB — BASIC METABOLIC PANEL
Anion gap: 10 (ref 5–15)
BUN: <5 mg/dL — ABNORMAL LOW (ref 6–20)
CO2: 17 mmol/L — ABNORMAL LOW (ref 22–32)
Calcium: 7.6 mg/dL — ABNORMAL LOW (ref 8.9–10.3)
Chloride: 113 mmol/L — ABNORMAL HIGH (ref 98–111)
Creatinine, Ser: 1.02 mg/dL — ABNORMAL HIGH (ref 0.44–1.00)
GFR calc Af Amer: >60 mL/min (ref >60)
GFR calc non Af Amer: >60 mL/min (ref >60)
Glucose, Bld: 80 mg/dL (ref 70–99)
Potassium: 3.6 mmol/L (ref 3.5–5.1)
Sodium: 140 mmol/L (ref 135–145)

## 2019-12-31 LAB — LACTIC ACID, PLASMA
Lactic Acid, Venous: 2.9 mmol/L (ref 0.5–1.9)
Lactic Acid, Venous: 2.6 mmol/L (ref 0.5–1.9)

## 2019-12-31 LAB — ECHOCARDIOGRAM COMPLETE
Height: 62 in
Weight: 1880.08 oz

## 2019-12-31 LAB — FOLATE RBC
Folate, Hemolysate: 192 ng/mL
Folate, RBC: 850 ng/mL (ref >498)
Hematocrit: 22.6 % — ABNORMAL LOW (ref 34.0–46.6)

## 2019-12-31 LAB — OCCULT BLOOD X 1 CARD TO LAB, STOOL: Fecal Occult Bld: NEGATIVE

## 2019-12-31 LAB — HEMOGLOBIN AND HEMATOCRIT, BLOOD
HCT: 28.4 % — ABNORMAL LOW (ref 36.0–46.0)
Hemoglobin: 9.6 g/dL — ABNORMAL LOW (ref 12.0–15.0)

## 2019-12-31 LAB — PROTIME-INR
INR: 4.1 (ref 0.8–1.2)
Prothrombin Time: 40.1 seconds — ABNORMAL HIGH (ref 11.4–15.2)

## 2019-12-31 LAB — PREPARE RBC (CROSSMATCH)

## 2019-12-31 MED ORDER — SODIUM CHLORIDE 0.9% IV SOLUTION: Freq: Once | INTRAVENOUS | Status: AC

## 2019-12-31 MED ORDER — MAGNESIUM SULFATE 2 GM/50ML IV SOLN
2.0000 g | Freq: Once | INTRAVENOUS | Status: AC
  Administered 2019-12-31: 2 g via INTRAVENOUS
  Filled 2019-12-31 (×2): qty 50

## 2019-12-31 NOTE — Progress Notes (Signed)
PROGRESS NOTE    Mary [name]  [id number] DOB: 20-Jul-1961 DOA: 12/29/2019 PCP: Antony Blackbird, MD    Brief Narrative:  Mary [name] is a 59 y.o. 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  She says she started having nonbloody diarrhea and vomiting since 3 days. Diarrhea is yellow in color, non-foul-smelling, 2-3 episodes per day, mucousy.  Reports that her symptoms are associated with dizziness, lightheadedness and weakness with standing and some cramping abdominal pain. Denies syncopal episodes.  Has chronic palpitation due to valve replacement.  She is compliant with her Coumadin as prescribed.  She says her cardiologist called her on Thursday and was told to go to ER for the concern of low H&H.  In the ED she was noted to be tachycardic and hypotensive with EMS. Denies smoking, drinks alcohol occasionally and uses marijuana every day.  ED Course: Upon arrival to ED: Patient tachycardic, blood pressure soft, troponin x2 -, lipase: WNL.    Afebrile with no leukocytosis, CBC shows macrocytic anemia. H&H 9.3/26.9.  Platelet: 427, BMP: WNL, lactic acid 3.0 trended down to 2.4.  CMP shows AKI.  POC occult blood. Chest x-ray negative for acute findings.  COVID-19 negative.  Patient was admitted for admission for dehydration, AKI and lactic acidosis.   Assessment & Plan:   Principal Problem:   Dehydration Active Problems:   Mitral valve insufficiency   Tricuspid regurgitation   S/P mitral valve replacement with metallic valve   S/P minimally-invasive tricuspid valve repair   Thrombocytosis (HCC)   Sinus tachycardia   Acute kidney injury (HCC)   Macrocytic anemia  GERD (gastroesophageal reflux disease)   Lactic acid acidosis   Marijuana abuse  Vomiting and diarrhea/viral gastroenteritis: -Likely secondary to acute viral gastroenteritis?  She is afebrile with no leukocytosis.  Tachycardic and hypotensive upon arrival likely secondary to volume loss.  Lipase: WNL.  POC occult blood negative, GI pathogen negative.  For some reason, C. difficile was never tested however now patient's diarrhea has improved and she has had only one bowel movement about 24 hours ago so I doubt C. difficile. UA negative. -Lactic acid: 3.0 trended down to 2.4.  We will recheck today.  Continue supportive treatment.  AKI: Likely secondary to dehydration due to nausea and vomiting.  Resolved.  Macrocytic anemia: Hemoglobin 9.3 upon presentation and now down to 6.9.  1 set of FOBT negative.  Will check another 1.  Transfuse 1 unit of PRBC.  Cardiology performing echo to see if there is any mechanical issue causing hemolysis.  Hold Coumadin for tonight.  INR 4.1. -TSH 4.393.  Vitamin B12 516.  Folate pending. -Monitor H&H closely.  Transfuse as needed to keep hemoglobin over 7.  Lactic acidosis: Likely secondary to dehydration -Continue IV fluids.  Check lactic acid today.  History of heart valve replacement with mechanical valve and is status post minimally invasive tricuspid valve repair: -Coumadin per pharmacy.  Hold Coumadin now.  Monitor PT/INR -On telemetry   Thrombocytosis: Likely reactive -Continue to monitor  Marijuana abuse: Counseled about cessation   DVT prophylaxis: Coumadin/SCD/TED.  Holding Coumadin today. Code Status: Full code Family Communication: None present at bedside.  Plan of care discussed with patient at length Disposition Plan:  Likely home.  Patient from home. Barriers to  discharge: Anemia needs further work-up. Consults called: Cardiology Antimicrobials:   None    Subjective: Patient seen and examined.    Had one bowel movement  about 24 hours ago which was similar.  No nausea.  Feeling a little bit dizzy.  No other complaint.  Objective: Vitals:   12/30/19 1024 12/30/19 1823 12/30/19 2315 12/31/19 0602  BP: (!) 134/91 140/73 116/63 128/80  Pulse: (!) 110 98 (!) 103 92  Resp: 18 18 20 18   Temp: 97.6 F (36.4 C) 98.2 F (36.8 C) 98.7 F (37.1 C) 98.3 F (36.8 C)  TempSrc: Oral Oral Oral Oral  SpO2: 100% 100% 100% 100%  Weight:      Height:        Intake/Output Summary (Last 24 hours) at 12/31/2019 0855 Last data filed at 12/31/2019 0739 Gross per 24 hour  Intake 2980 ml  Output 600 ml  Net 2380 ml   Filed Weights   12/29/19 1700 12/29/19 1800 12/29/19 1900  Weight: 55.2 kg 51.5 kg 53.3 kg    Examination:  General exam: Appears calm and comfortable  Respiratory system: Clear to auscultation. Respiratory effort normal. Cardiovascular system: S1 & S2 heard, RRR. No JVD,rubs, gallops or clicks. No pedal edema.  Systolic murmur. Gastrointestinal system: Abdomen is nondistended, soft and nontender. No organomegaly or masses felt. Normal bowel sounds heard. Central nervous system: Alert and oriented. No focal neurological deficits. Extremities: Symmetric 5 x 5 power. Skin: No rashes, lesions or ulcers.  Psychiatry: Judgement and insight appear normal. Mood & affect appropriate.    Data Reviewed: I have personally reviewed following labs and imaging studies  CBC: Recent Labs  Lab 12/29/19 1136 12/30/19 0603 12/31/19 0605  WBC 9.3 6.5 5.9  NEUTROABS 7.4  --   --   HGB 9.3* 7.3* 6.9*  HCT 26.9* 21.4* 21.1*  MCV 117.0* 115.7* 117.9*  PLT 427* 389 [id number]   Basic Metabolic Panel: Recent Labs  Lab 12/29/19 1351 12/29/19 1907 12/30/19 0603 12/31/19 0605  NA 140  --  141 140  K 3.5  --  3.5 3.6  CL 109  --  111 113*  CO2 17*  --  18* 17*  GLUCOSE 82  --  81 80  BUN 9  --  8 <5*  CREATININE 2.17*  --  1.80* 1.02*  CALCIUM 8.4*  --  7.7* 7.6*  MG  --  1.4*  --  1.7  PHOS  --  2.9  --   --     GFR: Estimated Creatinine Clearance: 48.1 mL/min (A) (by C-G formula based on SCr of 1.02 mg/dL (H)). Liver Function Tests: Recent Labs  Lab 12/29/19 1351  AST 40  ALT 22  ALKPHOS 124  BILITOT 0.7  PROT 5.8*  ALBUMIN 2.5*   Recent Labs  Lab 12/29/19 1136  LIPASE 13   No results for input(s): AMMONIA in the last 168 hours. Coagulation Profile: Recent Labs  Lab 12/29/19 1953 12/30/19 0603 12/31/19 0605  INR 2.7* 3.1* 4.1*   Cardiac Enzymes: No results for input(s): CKTOTAL, CKMB, CKMBINDEX, TROPONINI in the last 168 hours. BNP (last 3 results) No results for input(s): PROBNP in the last 8760 hours. HbA1C: No results for input(s): HGBA1C in the last 72 hours. CBG: No results for input(s): GLUCAP in the last 168 hours. Lipid Profile: No results for input(s): CHOL, HDL, LDLCALC, TRIG, CHOLHDL, LDLDIRECT in the last 72 hours. Thyroid Function Tests: Recent Labs    12/29/19 1953  TSH 4.393  Anemia Panel: Recent Labs    12/29/19 1907  VITAMINB12 516   Sepsis Labs: Recent Labs  Lab 12/29/19 1153 12/29/19 1351  LATICACIDVEN 3.0* 2.4*    Recent Results (from the past 240 hour(s))  GI pathogen panel by PCR, stool     Status: None   Collection Time: 12/29/19  3:18 PM   Specimen: Stool  Result Value Ref Range Status   Plesiomonas shigelloides NOT DETECTED NOT DETECTED Final   Yersinia enterocolitica NOT DETECTED NOT DETECTED Final   Vibrio NOT DETECTED NOT DETECTED Final   Enteropathogenic E coli NOT DETECTED NOT DETECTED Final   E coli (ETEC) LT/ST NOT DETECTED NOT DETECTED Final   E coli [id number] by PCR Not applicable NOT DETECTED Final   Cryptosporidium by PCR NOT DETECTED NOT DETECTED Final   Entamoeba histolytica NOT DETECTED NOT DETECTED Final   Adenovirus F 40/41 NOT DETECTED NOT DETECTED Final   Norovirus GI/GII NOT DETECTED NOT DETECTED Final   Sapovirus NOT DETECTED NOT DETECTED Final    Comment: (NOTE) Performed At: [facility] [place], [place] [id number] Rush Farmer MD [id number]    Vibrio cholerae NOT DETECTED NOT DETECTED Final   Campylobacter by PCR NOT DETECTED NOT DETECTED Final   Salmonella by PCR NOT DETECTED NOT DETECTED Final   E coli (STEC) NOT DETECTED NOT DETECTED Final   Enteroaggregative E coli NOT DETECTED NOT DETECTED Final   Shigella by PCR NOT DETECTED NOT DETECTED Final   Cyclospora cayetanensis NOT DETECTED NOT DETECTED Final   Astrovirus NOT DETECTED NOT DETECTED Final   G lamblia by PCR NOT DETECTED NOT DETECTED Final   Rotavirus A by PCR NOT DETECTED NOT DETECTED Final  Respiratory Panel by RT PCR (Flu A&B, Covid) - Nasopharyngeal Swab     Status: None   Collection Time: 12/29/19  3:26 PM   Specimen: Nasopharyngeal Swab  Result Value Ref Range Status   SARS Coronavirus 2 by RT PCR NEGATIVE NEGATIVE Final    Comment: (NOTE) SARS-CoV-2 target nucleic acids are NOT DETECTED. The SARS-CoV-2 RNA is generally detectable in upper respiratoy specimens during the acute phase of infection. The lowest concentration of SARS-CoV-2 viral copies this assay can detect is 131 copies/mL. A negative result does not preclude SARS-Cov-2 infection and should not be used as the sole basis for treatment or other patient management decisions. A negative result may occur with  improper specimen collection/handling, submission of specimen other than nasopharyngeal swab, presence of viral mutation(s) within the areas targeted by this assay, and inadequate number of viral copies (<131 copies/mL). A negative result must be combined with clinical observations, patient history, and epidemiological information. The expected result is Negative. Fact Sheet for Patients:  PinkCheek.be Fact Sheet for Healthcare Providers:  GravelBags.it This test is not yet ap proved or cleared by the [place] FDA and  has been authorized for detection  and/or diagnosis of SARS-CoV-2 by FDA under an Emergency Use Authorization (EUA). This EUA will remain  in effect (meaning this test can be used) for the duration of the COVID-19 declaration under Section 564(b)(1) of the Act, 21 U.S.C. section 360bbb-3(b)(1), unless the authorization is terminated or revoked sooner.    Influenza A by PCR NEGATIVE NEGATIVE Final   Influenza B by PCR NEGATIVE NEGATIVE Final    Comment: (NOTE) The Xpert Xpress SARS-CoV-2/FLU/RSV assay is intended as an aid in  the diagnosis of influenza from Nasopharyngeal swab specimens and  should not be used as a  sole basis for treatment. Nasal washings and  aspirates are unacceptable for Xpert Xpress SARS-CoV-2/FLU/RSV  testing. Fact Sheet for Patients: PinkCheek.be Fact Sheet for Healthcare Providers: GravelBags.it This test is not yet approved or cleared by the [place] FDA and  has been authorized for detection and/or diagnosis of SARS-CoV-2 by  FDA under an Emergency Use Authorization (EUA). This EUA will remain  in effect (meaning this test can be used) for the duration of the  Covid-19 declaration under Section 564(b)(1) of the Act, 21  U.S.C. section 360bbb-3(b)(1), unless the authorization is  terminated or revoked. Performed at [facility], [place] [address], [place], [place] [address]          Radiology Studies: US RENAL  Result Date: 12/29/2019 CLINICAL DATA:  Decreased renal function.  No urine output. EXAM: RENAL / URINARY TRACT ULTRASOUND COMPLETE COMPARISON:  None. FINDINGS: Right Kidney: Renal measurements: 9.4 x 3.5 x 4.1 cm = volume: 71 mL . Echogenicity within normal limits. No mass or hydronephrosis visualized. Left Kidney: Renal measurements: 8.4 x 4.7 x 4.7 cm = volume: 98 mL. Echogenicity within normal limits. No mass or hydronephrosis visualized. Bladder: Bladder is decompressed. Other: None. IMPRESSION: Kidneys appear  normal.  No hydronephrosis. Electronically Signed   By: Franki Cabot M.D.   On: 12/29/2019 16:04   DG Chest Port 1 View  Result Date: 12/29/2019 CLINICAL DATA:  Anemia with vomiting.  History of cardiac arrhythmia EXAM: PORTABLE CHEST 1 VIEW COMPARISON:  March 27, 2019 FINDINGS: Lungs are clear. Heart size and pulmonary vascular normal. Patient is status post mitral and tricuspid valve replacements. No adenopathy. No bone lesions. IMPRESSION: Status post tricuspid and mitral valve replacements. Heart size within normal limits. No edema or airspace opacity. No adenopathy. Electronically Signed   By: Lowella Grip III M.D.   On: 12/29/2019 11:22        Scheduled Meds: . sodium chloride   Intravenous Once  . aspirin EC  81 mg Oral Daily  . Warfarin - Pharmacist Dosing Inpatient   Does not apply q1600   Continuous Infusions: . sodium chloride 100 mL/hr at 12/31/19 0658     LOS: 1 day   Total time spent 33 minutes  Darliss Cheney ,MD [facility]   To contact the attending provider between 7A-7P or the covering provider during after hours 7P-7A, please log into the web site [email or web address] and access using universal [place] password for that web site. If you do not have the password, please call the hospital operator.  12/31/2019, 8:55 AM

## 2019-12-31 NOTE — Progress Notes (Signed)
ANTICOAGULATION CONSULT NOTE - Initial Consult  Pharmacy Consult for warfarin Indication: Mech MVR  Allergies  Allergen Reactions  . Oxycodone Itching    Patient Measurements: Height: 5\' 2"  (157.5 cm) Weight: 53.3 kg (117 lb 8.1 oz) IBW/kg (Calculated) : 50.1  Vital Signs: Temp: 98.3 F (36.8 C) (04/06 0602) Temp Source: Oral (04/06 0602) BP: 128/80 (04/06 0602) Pulse Rate: 92 (04/06 0602)  Labs: Recent Labs    12/29/19 1136 12/29/19 1136 12/29/19 1351 12/29/19 1953 12/30/19 0603 12/31/19 0605  HGB 9.3*   < >  --   --  7.3* 6.9*  HCT 26.9*  --   --   --  21.4* 21.1*  PLT 427*  --   --   --  389 341  LABPROT  --   --   --  28.3* 31.8* 40.1*  INR  --   --   --  2.7* 3.1* 4.1*  CREATININE  --   --  2.17*  --  1.80* 1.02*  TROPONINIHS 11  --  11  --   --   --    < > = values in this interval not displayed.    Estimated Creatinine Clearance: 48.1 mL/min (A) (by C-G formula based on SCr of 1.02 mg/dL (H)).   Medical History: Past Medical History:  Diagnosis Date  . Acute diastolic heart failure ([place]) 03/15/2013  . Anemia   . B12 deficiency   . Carotid stenosis    Carotid US 2/17: bilateral ICA 1-39% >> FU prn  . Childhood asthma   . Complete heart block ([place])   . Epigastric hernia 200's  . GERD (gastroesophageal reflux disease)   . Heart murmur   . History of blood transfusion    "14 w/1st pregnancy; 2 w/last C-section" (03/15/2013)  . Hx of echocardiogram    a. Echo 4/16:  Mild focal basal septal hypertrophy, EF 60-65%, no RWMA, Mechanical MVR ok with mild central regurgitation and no perivalvular leak, small mobile density attached to valvular ring (1.5x1 cm) - post surgical changes vs SBE, mild LAE;   b. Echo 2/17:  Mild post wall LVH, EF 55-60%, no RWMA, mechanical MVR ok (mean 5 mmHg), normal RVSF  . Hypertension    no pcp   will go to [facility]  . Macrocytic anemia   . 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 S/P mitral valve replacement with  metallic valve [id number]   31mm Sorin Carbomedics mechanical prosthesis via right mini thoracotomy approach  . 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   Complex valvuloplasty including Cor-matrix ECM patch augmentation of anterior and lateral leaflets with 60mm Edwards mc3 ring annuloplasty via right mini-thoracotomy approach  . Severe mitral regurgitation 04/22/2013  . Sinus tachycardia   . SVT (supraventricular tachycardia) ([place]) 03/16/2013  . TIA (transient ischemic attack)   . Tricuspid regurgitation     Assessment: 61 YOF presenting with hypotenstion/tachycardia.  On warfarin PTA for hx of mechanical valve.  Hx macrocytic anemia, no bleeding reported by pt or found in ED.  Last dose taken 4/3. INR on admission therapeutic at 2.7.   INR 3.1>>4.1 today with large jump. No DDI noted. Decreased intake vs acute illness? No acute bleeding noted, but Hgb continues to decrease to 6.9. PLTC stable.   PTA dosing: 5mg  daily except Friday takes 7.5mg   Goal of Therapy:  INR 2.5-3.5 Monitor platelets by anticoagulation protocol: Yes   Plan:  Hold warfarin today Daily INR, s/s bleeding  Jaquelin Meaney A. Levada Dy, PharmD, BCPS, FNKF  Clinical Pharmacist [place] Please utilize Amion for appropriate phone number to reach the unit pharmacist ([place])   12/31/2019 7:47 AM

## 2019-12-31 NOTE — Plan of Care (Signed)
  Problem: Health Behavior/Discharge Planning: Goal: Ability to manage health-related needs will improve Outcome: Progressing   Problem: Clinical Measurements: Goal: Diagnostic test results will improve Outcome: Progressing   

## 2019-12-31 NOTE — Plan of Care (Signed)
  Problem: Pain Managment: Goal: General experience of comfort will improve Outcome: Progressing   Problem: Skin Integrity: Goal: Risk for impaired skin integrity will decrease Outcome: Progressing   

## 2019-12-31 NOTE — Progress Notes (Signed)
Progress Note  Patient Name: Mary [name] Date of Encounter: 12/31/2019  Primary Cardiologist: Candee Furbish, MD   Subjective   Hgb continues to drop, 6.9 g/dL, without evidence of bleeding. INR 4.1, Coumadin management per pharmacy.   Inpatient Medications    Scheduled Meds: . sodium chloride   Intravenous Once  . aspirin EC  81 mg Oral Daily  . Warfarin - Pharmacist Dosing Inpatient   Does not apply q1600   Continuous Infusions: . sodium chloride 100 mL/hr at 12/31/19 0658   PRN Meds: acetaminophen **OR** acetaminophen, ondansetron **OR** ondansetron (ZOFRAN) IV, temazepam   Vital Signs    Vitals:   12/30/19 1024 12/30/19 1823 12/30/19 2315 12/31/19 0602  BP: (!) 134/91 140/73 116/63 128/80  Pulse: (!) 110 98 (!) 103 92  Resp: 18 18 20 18   Temp: 97.6 F (36.4 C) 98.2 F (36.8 C) 98.7 F (37.1 C) 98.3 F (36.8 C)  TempSrc: Oral Oral Oral Oral  SpO2: 100% 100% 100% 100%  Weight:      Height:        Intake/Output Summary (Last 24 hours) at 12/31/2019 0907 Last data filed at 12/31/2019 0739 Gross per 24 hour  Intake 2740 ml  Output 600 ml  Net 2140 ml   Last 3 Weights 12/29/2019 12/29/2019 12/29/2019  Weight (lbs) 117 lb 8.1 oz 113 lb 8 oz 121 lb 11.1 oz  Weight (kg) 53.3 kg 51.483 kg 55.2 kg      Telemetry    Sinus tach - Personally Reviewed  ECG    ECG - sinus tach rate 107 - Personally Reviewed  Physical Exam   GEN: No acute distress.   Neck: No JVD Cardiac: tachycardic, crisp valve closure sound at S1 Respiratory: Clear to auscultation bilaterally. GI: Soft, nontender, non-distended  MS: No edema; No deformity. Neuro:  Nonfocal  Psych: Normal affect   Labs    High Sensitivity Troponin:   Recent Labs  Lab 12/29/19 1136 12/29/19 1351  TROPONINIHS 11 11      Chemistry Recent Labs  Lab 12/29/19 1351 12/30/19 0603 12/31/19 0605  NA 140 141 140  K 3.5 3.5 3.6  CL 109 111 113*  CO2 17* 18* 17*  GLUCOSE 82 81 80  BUN 9 8 <5*    CREATININE 2.17* 1.80* 1.02*  CALCIUM 8.4* 7.7* 7.6*  PROT 5.8*  --   --   ALBUMIN 2.5*  --   --   AST 40  --   --   ALT 22  --   --   ALKPHOS 124  --   --   BILITOT 0.7  --   --   GFRNONAA 24* 31* >60  GFRAA 28* 36* >60  ANIONGAP 14 12 10      Hematology Recent Labs  Lab 12/29/19 1136 12/30/19 0603 12/31/19 0605  WBC 9.3 6.5 5.9  RBC 2.30* 1.85* 1.79*  HGB 9.3* 7.3* 6.9*  HCT 26.9* 21.4* 21.1*  MCV 117.0* 115.7* 117.9*  MCH 40.4* 39.5* 38.5*  MCHC 34.6 34.1 32.7  RDW 17.5* 16.8* 17.2*  PLT 427* 389 341    BNP Recent Labs  Lab 12/29/19 1136  BNP 44.5     DDimer No results for input(s): DDIMER in the last 168 hours.   Radiology    US RENAL  Result Date: 12/29/2019 CLINICAL DATA:  Decreased renal function.  No urine output. EXAM: RENAL / URINARY TRACT ULTRASOUND COMPLETE COMPARISON:  None. FINDINGS: Right Kidney: Renal measurements: 9.4 x 3.5 x  4.1 cm = volume: 71 mL . Echogenicity within normal limits. No mass or hydronephrosis visualized. Left Kidney: Renal measurements: 8.4 x 4.7 x 4.7 cm = volume: 98 mL. Echogenicity within normal limits. No mass or hydronephrosis visualized. Bladder: Bladder is decompressed. Other: None. IMPRESSION: Kidneys appear normal.  No hydronephrosis. Electronically Signed   By: Franki Cabot M.D.   On: 12/29/2019 16:04   DG Chest Port 1 View  Result Date: 12/29/2019 CLINICAL DATA:  Anemia with vomiting.  History of cardiac arrhythmia EXAM: PORTABLE CHEST 1 VIEW COMPARISON:  March 27, 2019 FINDINGS: Lungs are clear. Heart size and pulmonary vascular normal. Patient is status post mitral and tricuspid valve replacements. No adenopathy. No bone lesions. IMPRESSION: Status post tricuspid and mitral valve replacements. Heart size within normal limits. No edema or airspace opacity. No adenopathy. Electronically Signed   By: Lowella Grip III M.D.   On: 12/29/2019 11:22    Cardiac Studies   Echo pending  Patient Profile  Mary [name] is  a 59 y.o. 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 Barth Kirks.  Assessment & Plan    1. Mechanical MVR (6mm Sorin Carbomedics mechanical MVR) Will obtain echocardiogram today to evaluate for any valvular dysfunction contributing to anemia. Haptoglobin negative, smear normal. Unlikely to be vavular related hemolysis however will follow echo. - with elevated INR ok to adjust coumadin to maintain therapeutic INR of 3. Per pharmacy.  2. Worsening anemia, macrocytic - with indefinite indications for anticoagulation (mechanical mitral valve) she will require a workup for anemia including GI evaluation.   3. Tachycardia (superimposed on history of intermittent CHB) - chronically patient has history of sinus tachycardia but had brief episode of what appears to be atrial tach for about 40 minutes earlier today. This resolved with IV metoprolol. Traditionally she has not been felt to be a candidate for AVN blockers superimposed on history of intermittent transient complete heart block which has been managed conservatively. EKG shows diffuse subtle ST depression possibly exacerbated by metabolic stressors. hsTroponins are normal.  - replete lytes as needed - ECG with sinus tachycardia this am, likely 2/2 to anemia. - prn metoprolol, AVN blocking agents not favorable with history of CHB  4. Hypomagnesemia - 1.7 after 2 grams yesterday, will replete  For questions or updates, please contact [place] Please consult [email or web address] for contact info under        Signed, Elouise Munroe, MD  12/31/2019, 9:07 AM

## 2019-12-31 NOTE — Plan of Care (Signed)
  Problem: Education: Goal: Knowledge of General Education information will improve Description Including pain rating scale, medication(s)/side effects and non-pharmacologic comfort measures Outcome: Progressing   

## 2019-12-31 NOTE — Progress Notes (Signed)
  Echocardiogram 2D Echocardiogram has been performed.  Johny Chess 12/31/2019, 10:56 AM

## 2020-01-01 LAB — CBC WITH DIFFERENTIAL/PLATELET
Abs Immature Granulocytes: 0.03 10*3/uL (ref 0.00–0.07)
Basophils Absolute: 0 10*3/uL (ref 0.0–0.1)
Basophils Relative: 1 %
Eosinophils Absolute: 0.3 10*3/uL (ref 0.0–0.5)
Eosinophils Relative: 5 %
HCT: 24.7 % — ABNORMAL LOW (ref 36.0–46.0)
Hemoglobin: 8.2 g/dL — ABNORMAL LOW (ref 12.0–15.0)
Immature Granulocytes: 1 %
Lymphocytes Relative: 15 %
Lymphs Abs: 1 10*3/uL (ref 0.7–4.0)
MCH: 37.4 pg — ABNORMAL HIGH (ref 26.0–34.0)
MCHC: 33.2 g/dL (ref 30.0–36.0)
MCV: 112.8 fL — ABNORMAL HIGH (ref 80.0–100.0)
Monocytes Absolute: 0.5 10*3/uL (ref 0.1–1.0)
Monocytes Relative: 7 %
Neutro Abs: 4.6 10*3/uL (ref 1.7–7.7)
Neutrophils Relative %: 71 %
Platelets: 296 10*3/uL (ref 150–400)
RBC: 2.19 MIL/uL — ABNORMAL LOW (ref 3.87–5.11)
RDW: 19.9 % — ABNORMAL HIGH (ref 11.5–15.5)
WBC: 6.3 10*3/uL (ref 4.0–10.5)
nRBC: 0 % (ref 0.0–0.2)

## 2020-01-01 LAB — RETICULOCYTES
Immature Retic Fract: 14.9 % (ref 2.3–15.9)
RBC.: 2.19 MIL/uL — ABNORMAL LOW (ref 3.87–5.11)
Retic Count, Absolute: 31.3 10*3/uL (ref 19.0–186.0)
Retic Ct Pct: 1.4 % (ref 0.4–3.1)

## 2020-01-01 LAB — BASIC METABOLIC PANEL
Anion gap: 9 (ref 5–15)
BUN: 6 mg/dL (ref 6–20)
CO2: 15 mmol/L — ABNORMAL LOW (ref 22–32)
Calcium: 7.6 mg/dL — ABNORMAL LOW (ref 8.9–10.3)
Chloride: 117 mmol/L — ABNORMAL HIGH (ref 98–111)
Creatinine, Ser: 0.79 mg/dL (ref 0.44–1.00)
GFR calc Af Amer: >60 mL/min (ref >60)
GFR calc non Af Amer: >60 mL/min (ref >60)
Glucose, Bld: 84 mg/dL (ref 70–99)
Potassium: 3.6 mmol/L (ref 3.5–5.1)
Sodium: 141 mmol/L (ref 135–145)

## 2020-01-01 LAB — HEMOGLOBIN AND HEMATOCRIT, BLOOD
HCT: 24.4 % — ABNORMAL LOW (ref 36.0–46.0)
Hemoglobin: 8.2 g/dL — ABNORMAL LOW (ref 12.0–15.0)

## 2020-01-01 LAB — PROTIME-INR
INR: 3.8 — ABNORMAL HIGH (ref 0.8–1.2)
Prothrombin Time: 37.1 seconds — ABNORMAL HIGH (ref 11.4–15.2)

## 2020-01-01 MED ORDER — WARFARIN SODIUM 5 MG PO TABS: 5.0000 mg | ORAL_TABLET | ORAL | Status: DC

## 2020-01-01 NOTE — Progress Notes (Addendum)
Went to schedule f/u for patient and cardmaster had already done so from an earlier request - the patient has appt 4/14 with Dr. Marlou Porch. Appt info was placed on AVS. If patient needs earlier Coumadin appointment for close recheck based on dose adjustment please let Cardmaster Trish know - if she is unavailable let one of our APPs or providers know so we can help arrange. Dayna Dunn PA-C ----------------------------- Echocardiogram reviewed, no concerning findings with regard to the mechanical valve. In addition, hemolysis workup was negative. Can follow up in cardiology as noted above. Continued workup per IM/GI. Discussed with Dr. Eliseo Squires. Elouise Munroe, MD

## 2020-01-01 NOTE — Progress Notes (Signed)
ANTICOAGULATION CONSULT NOTE - Follow Up Consult  Pharmacy Consult for Coumadin Indication: Mechanical valve  Allergies  Allergen Reactions  . Oxycodone Itching    Patient Measurements: Height: 5\' 2"  (157.5 cm) Weight: 53.3 kg (117 lb 8.3 oz) IBW/kg (Calculated) : 50.1  Vital Signs: Temp: 98.7 F (37.1 C) (04/07 0440) Temp Source: Oral (04/07 0440) BP: 131/77 (04/07 0440) Pulse Rate: 87 (04/07 0440)  Labs: Recent Labs     0000 12/29/19 1136 12/29/19 1351 12/29/19 1953 12/30/19 0603 12/30/19 0603 12/31/19 0605 12/31/19 0605 12/31/19 1630 01/01/20 0503  HGB   < > 9.3*  --   --  7.3*   < > 6.9*   < > 9.6* 8.2*  HCT   < > 26.9*  --    < > 21.4*   < > 21.1*  --  28.4* 24.7*  PLT   < > 427*  --   --  389  --  341  --   --  296  LABPROT  --   --   --    < > 31.8*  --  40.1*  --   --  37.1*  INR  --   --   --    < > 3.1*  --  4.1*  --   --  3.8*  CREATININE   < >  --  2.17*  --  1.80*  --  1.02*  --   --  0.79  TROPONINIHS  --  11 11  --   --   --   --   --   --   --    < > = values in this interval not displayed.    Estimated Creatinine Clearance: 61.4 mL/min (by C-G formula based on SCr of 0.79 mg/dL).  Assessment: Anticoag: warfarin PTA for hx of mechanical valve. Hx macrocytic anemia, no bleeding reported by pt or found in ED. No bleeding noted, but hgb decreased to 6.9 now at 8.2. INR 3.8, No DDI noted.   - PTA dosing: 5mg  daily except Friday takes 7.5mg . Admit INR 2.7 (goal INR 3)  Goal of Therapy:  INR goal 3 per cards note Monitor platelets by anticoagulation protocol: Yes   Plan:  Warfarin on hold again today. Daily INR, s/s bleeding   Glendola Friedhoff S. Alford Highland, PharmD, BCPS Clinical Staff Pharmacist [email or web address] Alford Highland, [place] 01/01/2020,8:29 AM

## 2020-01-01 NOTE — Discharge Instructions (Signed)

## 2020-01-01 NOTE — Discharge Summary (Signed)
Physician Discharge Summary  Mary [name] [id number] DOB: 24-Feb-1961 DOA: 12/29/2019  PCP: Antony Blackbird, MD  Admit date: 12/29/2019 Discharge date: 01/01/2020  Admitted From: Home Discharge disposition: Home   Recommendations for Outpatient Follow-Up:   1. INR next week along with CBCReferral to GI for routine colonoscopy 2. Needs further work-up of anemia: If hemoglobin continues to drop despite supplementation of B12 will need referral to hematology for further work-up, Referral to GI for routine colonoscopy, Check iron at next outpatient visit 3. Avoid alcohol   Discharge Diagnosis:   Principal Problem:   Dehydration Active Problems:   Mitral valve insufficiency   Tricuspid regurgitation   S/P mitral valve replacement with metallic valve   S/P minimally-invasive tricuspid valve repair   Thrombocytosis (HCC)   Sinus tachycardia   Acute kidney injury (HCC)   Macrocytic anemia   GERD (gastroesophageal reflux disease)   Lactic acid acidosis   Marijuana abuse    Discharge Condition: Improved.  Diet recommendation:  Regular.  Wound care: None.  Code status: Full.   History of Present Illness:    Mary [name] is a 59 y.o. female with medical history significant of mitral valve regurgitation status post mechanical valve replacement, tricuspid regurgitation status post tricuspid valve repair-she is on Coumadin, GERD, chronic diastolic congestive heart failure, transient complete heart block, macrocytic anemia, marijuana abuse presents to emergency department due to vomiting, diarrhea, lightheadedness since 3 days.  Patient tells pain that she is having nonbloody diarrhea and vomiting since 3 days.  She vomits 3-4 times per day, clear.her diarrhea is yellow in color, non-foul-smelling, 2-3 episodes per day, mucousy.  Reports that her symptoms are associated with dizziness, lightheadedness and weakness with standing.  No episodes of passing outs or head  trauma.  Has chronic palpitation due to valve replacement.  She is compliant with her Coumadin as prescribed.  She tells me that her cardiologist called her on Thursday and was told to go to ER for the concern of low H&H.  She called EMS this morning and came to the ER for further evaluation and management.  Patient was tachycardic and hypotensive with EMS.  Denies fever, chills, cough, congestion, recent hospitalization, headache, chest pain, leg swelling, dysuria, hematuria, decreased appetite or weight loss.  She lives alone at home.  Denies smoking, drinks alcohol occasionally and uses marijuana every day.    Hospital Course by Problem:   Vomiting and diarrhea/viral gastroenteritis: -Resolved, patient eating well  AKI: Likely secondary to dehydration due to nausea and vomiting.  Resolved.  Macrocytic anemia:  -s/p 1 unit of PRBC.   -Cardiology performing echo to see if there is any mechanical issue causing hemolysis--echo unrevealing.   -TSH 4.393.  Vitamin B12 516.  Folate within normal limits  Lactic acidosis: Likely secondary to dehydration  History of heart valve replacement with mechanical valve and is status post minimally invasive tricuspid valve repair: -Coumadin to resume on 4/8  Marijuana abuse: Counseled about cessation      Medical Consultants:   Cardiology   Discharge Exam:   Vitals:   01/01/20 0440 01/01/20 0954  BP: 131/77 128/84  Pulse: 87 95  Resp: 15 18  Temp: 98.7 F (37.1 C) 97.9 F (36.6 C)  SpO2: 100% 100%   Vitals:   12/31/19 1700 12/31/19 2042 01/01/20 0440 01/01/20 0954  BP: 130/85 135/82 131/77 128/84  Pulse: 98 96 87 95  Resp: 18 18 15 18   Temp: 98.6 F (37 C) 98.7  F (37.1 C) 98.7 F (37.1 C) 97.9 F (36.6 C)  TempSrc: Oral Oral Oral   SpO2: 99% 99% 100% 100%  Weight:  53.3 kg    Height:        General exam: Appears calm and comfortable.  Doing better so she is anxious to be discharged   The results of  significant diagnostics from this hospitalization (including imaging, microbiology, ancillary and laboratory) are listed below for reference.     Procedures and Diagnostic Studies:   US RENAL  Result Date: 12/29/2019 CLINICAL DATA:  Decreased renal function.  No urine output. EXAM: RENAL / URINARY TRACT ULTRASOUND COMPLETE COMPARISON:  None. FINDINGS: Right Kidney: Renal measurements: 9.4 x 3.5 x 4.1 cm = volume: 71 mL . Echogenicity within normal limits. No mass or hydronephrosis visualized. Left Kidney: Renal measurements: 8.4 x 4.7 x 4.7 cm = volume: 98 mL. Echogenicity within normal limits. No mass or hydronephrosis visualized. Bladder: Bladder is decompressed. Other: None. IMPRESSION: Kidneys appear normal.  No hydronephrosis. Electronically Signed   By: Franki Cabot M.D.   On: 12/29/2019 16:04   DG Chest Port 1 View  Result Date: 12/29/2019 CLINICAL DATA:  Anemia with vomiting.  History of cardiac arrhythmia EXAM: PORTABLE CHEST 1 VIEW COMPARISON:  March 27, 2019 FINDINGS: Lungs are clear. Heart size and pulmonary vascular normal. Patient is status post mitral and tricuspid valve replacements. No adenopathy. No bone lesions. IMPRESSION: Status post tricuspid and mitral valve replacements. Heart size within normal limits. No edema or airspace opacity. No adenopathy. Electronically Signed   By: Lowella Grip III M.D.   On: 12/29/2019 11:22     Labs:   Basic Metabolic Panel: Recent Labs  Lab 12/29/19 1351 12/29/19 1351 12/29/19 1907 12/30/19 0603 12/30/19 0603 12/31/19 0605 01/01/20 0503  NA 140  --   --  141  --  140 141  K 3.5   < >  --  3.5   < > 3.6 3.6  CL 109  --   --  111  --  113* 117*  CO2 17*  --   --  18*  --  17* 15*  GLUCOSE 82  --   --  81  --  80 84  BUN 9  --   --  8  --  <5* 6  CREATININE 2.17*  --   --  1.80*  --  1.02* 0.79  CALCIUM 8.4*  --   --  7.7*  --  7.6* 7.6*  MG  --   --  1.4*  --   --  1.7  --   PHOS  --   --  2.9  --   --   --   --    < > =  values in this interval not displayed.   GFR Estimated Creatinine Clearance: 61.4 mL/min (by C-G formula based on SCr of 0.79 mg/dL). Liver Function Tests: Recent Labs  Lab 12/29/19 1351  AST 40  ALT 22  ALKPHOS 124  BILITOT 0.7  PROT 5.8*  ALBUMIN 2.5*   Recent Labs  Lab 12/29/19 1136  LIPASE 13   No results for input(s): AMMONIA in the last 168 hours. Coagulation profile Recent Labs  Lab 12/29/19 1953 12/30/19 0603 12/31/19 0605 01/01/20 0503  INR 2.7* 3.1* 4.1* 3.8*    CBC: Recent Labs  Lab 12/29/19 1136 12/29/19 1953 12/30/19 0603 12/31/19 0605 12/31/19 1630 01/01/20 0503 01/01/20 1125  WBC 9.3  --  6.5 5.9  --  6.3  --   NEUTROABS 7.4  --   --   --   --  4.6  --   HGB 9.3*  --  7.3* 6.9* 9.6* 8.2* 8.2*  HCT 26.9*   < > 21.4* 21.1* 28.4* 24.7* 24.4*  MCV 117.0*  --  115.7* 117.9*  --  112.8*  --   PLT 427*  --  389 341  --  296  --    < > = values in this interval not displayed.   Cardiac Enzymes: No results for input(s): CKTOTAL, CKMB, CKMBINDEX, TROPONINI in the last 168 hours. BNP: Invalid input(s): POCBNP CBG: No results for input(s): GLUCAP in the last 168 hours. D-Dimer No results for input(s): DDIMER in the last 72 hours. Hgb A1c No results for input(s): HGBA1C in the last 72 hours. Lipid Profile No results for input(s): CHOL, HDL, LDLCALC, TRIG, CHOLHDL, LDLDIRECT in the last 72 hours. Thyroid function studies Recent Labs    12/29/19 1953  TSH 4.393   Anemia work up Recent Labs    12/29/19 1907 01/01/20 1125  VITAMINB12 516  --   RETICCTPCT  --  1.4   Microbiology Recent Results (from the past 240 hour(s))  GI pathogen panel by PCR, stool     Status: None   Collection Time: 12/29/19  3:18 PM   Specimen: Stool  Result Value Ref Range Status   Plesiomonas shigelloides NOT DETECTED NOT DETECTED Final   Yersinia enterocolitica NOT DETECTED NOT DETECTED Final   Vibrio NOT DETECTED NOT DETECTED Final   Enteropathogenic E coli  NOT DETECTED NOT DETECTED Final   E coli (ETEC) LT/ST NOT DETECTED NOT DETECTED Final   E coli [id number] by PCR Not applicable NOT DETECTED Final   Cryptosporidium by PCR NOT DETECTED NOT DETECTED Final   Entamoeba histolytica NOT DETECTED NOT DETECTED Final   Adenovirus F 40/41 NOT DETECTED NOT DETECTED Final   Norovirus GI/GII NOT DETECTED NOT DETECTED Final   Sapovirus NOT DETECTED NOT DETECTED Final    Comment: (NOTE) Performed At: [facility] [place], [place] [id number] Rush Farmer MD [id number]    Vibrio cholerae NOT DETECTED NOT DETECTED Final   Campylobacter by PCR NOT DETECTED NOT DETECTED Final   Salmonella by PCR NOT DETECTED NOT DETECTED Final   E coli (STEC) NOT DETECTED NOT DETECTED Final   Enteroaggregative E coli NOT DETECTED NOT DETECTED Final   Shigella by PCR NOT DETECTED NOT DETECTED Final   Cyclospora cayetanensis NOT DETECTED NOT DETECTED Final   Astrovirus NOT DETECTED NOT DETECTED Final   G lamblia by PCR NOT DETECTED NOT DETECTED Final   Rotavirus A by PCR NOT DETECTED NOT DETECTED Final  Respiratory Panel by RT PCR (Flu A&B, Covid) - Nasopharyngeal Swab     Status: None   Collection Time: 12/29/19  3:26 PM   Specimen: Nasopharyngeal Swab  Result Value Ref Range Status   SARS Coronavirus 2 by RT PCR NEGATIVE NEGATIVE Final    Comment: (NOTE) SARS-CoV-2 target nucleic acids are NOT DETECTED. The SARS-CoV-2 RNA is generally detectable in upper respiratoy specimens during the acute phase of infection. The lowest concentration of SARS-CoV-2 viral copies this assay can detect is 131 copies/mL. A negative result does not preclude SARS-Cov-2 infection and should not be used as the sole basis for treatment or other patient management decisions. A negative result may occur with  improper specimen collection/handling, submission of specimen other than nasopharyngeal swab, presence of viral mutation(s) within  the areas targeted by this  assay, and inadequate number of viral copies (<131 copies/mL). A negative result must be combined with clinical observations, patient history, and epidemiological information. The expected result is Negative. Fact Sheet for Patients:  PinkCheek.be Fact Sheet for Healthcare Providers:  GravelBags.it This test is not yet ap proved or cleared by the [place] FDA and  has been authorized for detection and/or diagnosis of SARS-CoV-2 by FDA under an Emergency Use Authorization (EUA). This EUA will remain  in effect (meaning this test can be used) for the duration of the COVID-19 declaration under Section 564(b)(1) of the Act, 21 U.S.C. section 360bbb-3(b)(1), unless the authorization is terminated or revoked sooner.    Influenza A by PCR NEGATIVE NEGATIVE Final   Influenza B by PCR NEGATIVE NEGATIVE Final    Comment: (NOTE) The Xpert Xpress SARS-CoV-2/FLU/RSV assay is intended as an aid in  the diagnosis of influenza from Nasopharyngeal swab specimens and  should not be used as a sole basis for treatment. Nasal washings and  aspirates are unacceptable for Xpert Xpress SARS-CoV-2/FLU/RSV  testing. Fact Sheet for Patients: PinkCheek.be Fact Sheet for Healthcare Providers: GravelBags.it This test is not yet approved or cleared by the [place] FDA and  has been authorized for detection and/or diagnosis of SARS-CoV-2 by  FDA under an Emergency Use Authorization (EUA). This EUA will remain  in effect (meaning this test can be used) for the duration of the  Covid-19 declaration under Section 564(b)(1) of the Act, 21  U.S.C. section 360bbb-3(b)(1), unless the authorization is  terminated or revoked. Performed at [facility], [place] [address], [place], [place] [address]      Discharge Instructions:   Discharge Instructions    Diet general   Complete by: As  directed    Discharge instructions   Complete by: As directed    Hold coumadin tonight and resume on 4/8 Cbc/INR check next week Avoid alcohol for now Take a multivitamin daily along with B12 vitamin Will have PCP place referral to GI   Increase activity slowly   Complete by: As directed      Allergies as of 01/01/2020      Reactions   Oxycodone Itching      Medication List    STOP taking these medications   loratadine 10 MG tablet Commonly known as: CLARITIN   ondansetron 8 MG tablet Commonly known as: Zofran   saccharomyces boulardii 250 MG capsule Commonly known as: FLORASTOR     TAKE these medications   acetaminophen 500 MG tablet Commonly known as: TYLENOL Take 1,000 mg by mouth every 6 (six) hours as needed for headache.   aspirin EC 81 MG tablet Take 1 tablet (81 mg total) by mouth daily.   CLEAR EYES COMPLETE OP Place 2 drops into both eyes daily as needed (red eyes).   famotidine 20 MG tablet Commonly known as: PEPCID Take 1 tablet (20 mg total) by mouth 2 (two) times daily.   fluticasone 50 MCG/ACT nasal spray Commonly known as: FLONASE Place 1 spray into both nostrils daily as needed for allergies or rhinitis.   warfarin 5 MG tablet Commonly known as: COUMADIN Take as directed. If you are unsure how to take this medication, talk to your nurse or doctor. Original instructions: Take 1-1.5 tablets (5-7.5 mg total) by mouth See admin instructions. Take 1 to 1.5 tablets daily as directed by Anticoagulation Clinic on Mon, Wed, Friday taking 7.5mg  and all other days 5 mg tablet.  Start taking on: January 02, 2020      Follow-up Information    Jerline Pain, MD Follow up.   Specialty: Cardiology Why: A followup with the heart doctor has been made for Wednesday April14, 2021 at 8:40 AM (Arrive by 8:25 AM). Contact information: [id number] [place] [address] [phone]        Antony Blackbird, MD. Schedule an appointment as soon as  possible for a visit.   Specialty: Family Medicine Contact information: [place] [address] [phone]            Time coordinating discharge: 35 min Signed:  Geradine Girt DO  [facility] 01/01/2020, 1:36 PM

## 2020-01-01 NOTE — Progress Notes (Signed)
RN went to take pt to main entrance for discharge. PT has arranged uber ride. When we got to the main entrance the [place] had left pt. This RN is setting up transportation through [place] transportation.

## 2020-01-02 ENCOUNTER — Telehealth: Payer: Self-pay

## 2020-01-02 LAB — TYPE AND SCREEN
ABO/RH(D): O POS
Antibody Screen: NEGATIVE
Donor AG Type: NEGATIVE
Donor AG Type: NEGATIVE
Unit division: 0
Unit division: 0

## 2020-01-02 LAB — BPAM RBC
Blood Product Expiration Date: 202105082359
Blood Product Expiration Date: 202105082359
ISSUE DATE / TIME: 202104011431
ISSUE DATE / TIME: 202104061017
Unit Type and Rh: 5100
Unit Type and Rh: 5100

## 2020-01-02 NOTE — Telephone Encounter (Signed)
Transition Care Management Follow-up Telephone Call  Date of discharge and from where: 01/01/2020, [facility]   How have you been since you were released from the hospital? No concerns reported   Any questions or concerns? Only question was regarding obtaining financial assistance so she can be referred to specialists.  She only has family planning medicaid. Explained to her that she can pick up an application /information for the [employer] at [facility].  After she collects the documents she can schedule an appointment with the [facility] financial counselor to submit the applications. She said she would come to the clinic today to pick up the information. An envelope with the application was left at the front desk for her.   Items Reviewed:  Did the pt receive and understand the discharge instructions provided? she was not home at the time of the call.  She said the information was at home   Medications obtained and verified? she said she has all medications. Instructed her to call this clinic or cardiology  if she has any questions when she gets home.  She said she did not take the warfarin last night as instructed and she will need to review the instructions for further information about dosing going forward.   Any new allergies since your discharge?   None reported   Do you have support at home? lives alone  Other (ie: DME, Home Health, etc) no home health or DME ordered.   Functional Questionnaire: (I = Independent and D = Dependent) ADL's: independent   Follow up appointments reviewed:    PCP Hospital f/u appt confirmed?.scheduled appointment with Dr Chapman Fitch 01/08/2020 @ 1050. She requested that the appt be the same day as her cardiology appt.   [facility] f/u appt confirmed? .cardiology - 01/08/2020. INR check 01/22/2020  Are transportation arrangements needed?   No, she said she can arrange transportation  If their condition worsens, is the pt aware  to call  their PCP or go to the ED?yes   Was the patient provided with contact information for the PCP's office or ED?  She has the phone number for the clinic  Was the pt encouraged to call back with questions or concerns?  yes

## 2020-01-03 ENCOUNTER — Telehealth: Payer: Self-pay | Admitting: Licensed Clinical Social Worker

## 2020-01-03 NOTE — Telephone Encounter (Signed)
Pt called CSW to request transport to appt on 4/14 to [address]  Transport request sent in to [employer].  CSW will continue to follow and assist as needed  Jorge Ny, [facility] Desk#: [phone] Cell#: [phone]

## 2020-01-08 ENCOUNTER — Other Ambulatory Visit: Payer: Self-pay

## 2020-01-08 ENCOUNTER — Telehealth: Payer: Self-pay

## 2020-01-08 ENCOUNTER — Encounter: Payer: Self-pay | Admitting: Cardiology

## 2020-01-08 ENCOUNTER — Ambulatory Visit (INDEPENDENT_AMBULATORY_CARE_PROVIDER_SITE_OTHER): Payer: Self-pay | Admitting: Cardiology

## 2020-01-08 ENCOUNTER — Ambulatory Visit: Payer: Medicaid Other | Admitting: Family Medicine

## 2020-01-08 VITALS — BP 100/60 | HR 125 | Ht 62.0 in | Wt 117.8 lb

## 2020-01-08 DIAGNOSIS — I059 Rheumatic mitral valve disease, unspecified: Secondary | ICD-10-CM

## 2020-01-08 DIAGNOSIS — Z954 Presence of other heart-valve replacement: Secondary | ICD-10-CM

## 2020-01-08 DIAGNOSIS — D649 Anemia, unspecified: Secondary | ICD-10-CM

## 2020-01-08 LAB — CBC
Hematocrit: 29.9 % — ABNORMAL LOW (ref 34.0–46.6)
Hemoglobin: 10.6 g/dL — ABNORMAL LOW (ref 11.1–15.9)
MCH: 37.2 pg — ABNORMAL HIGH (ref 26.6–33.0)
MCHC: 35.5 g/dL (ref 31.5–35.7)
MCV: 105 fL — ABNORMAL HIGH (ref 79–97)
Platelets: 386 10*3/uL (ref 150–450)
RBC: 2.85 x10E6/uL — ABNORMAL LOW (ref 3.77–5.28)
RDW: 17.9 % — ABNORMAL HIGH (ref 11.7–15.4)
WBC: 7.9 10*3/uL (ref 3.4–10.8)

## 2020-01-08 MED ORDER — WARFARIN SODIUM 5 MG PO TABS: ORAL_TABLET | ORAL | 1 refills | Status: DC

## 2020-01-08 NOTE — Patient Instructions (Addendum)
Medication Instructions:  The current medical regimen is effective;  continue present plan and medications.  *If you need a refill on your cardiac medications before your next appointment, please call your pharmacy*  Lab:  Please have stat blood work today (CBC)  You have been referred to Gastroenterology for the evaluation of anemia.  Follow-Up: At [facility], you and your health needs are our priority.  As part of our continuing mission to provide you with exceptional heart care, we have created designated Provider Care Teams.  These Care Teams include your primary Cardiologist (physician) and Advanced Practice Providers (APPs -  Physician Assistants and Nurse Practitioners) who all work together to provide you with the care you need, when you need it.  We recommend signing up for the patient portal called "MyChart".  Sign up information is provided on this After Visit Summary.  MyChart is used to connect with patients for Virtual Visits (Telemedicine).  Patients are able to view lab/test results, encounter notes, upcoming appointments, etc.  Non-urgent messages can be sent to your provider as well.   To learn more about what you can do with MyChart, go to [email or web address].    Your next appointment:   2 week(s)  The format for your next appointment:   In Person  Provider:   Richardson Dopp   Thank you for choosing [facility]!!

## 2020-01-08 NOTE — Telephone Encounter (Signed)
-----   Message from Jerline Pain, MD sent at 01/08/2020  1:22 PM EDT ----- Hemoglobin 10.6 up from 8.2.  Excellent.  Shows signs of improvement.  Awaiting GI appointment.  No need for urgent hospitalization. Candee Furbish, MD

## 2020-01-08 NOTE — Progress Notes (Signed)
Cardiology Office Note:    Date:  01/08/2020   ID:  Mary [name], DOB [birth year], MRN [id number]  PCP:  Antony Blackbird, MD  Cardiologist:  Candee Furbish, MD  Electrophysiologist:  Cristopher Peru, MD   Referring MD: Antony Blackbird, MD    History of Present Illness:    Mary [name] is a 59 y.o. female here for the follow-up of mitral valve disease said to complete heart block, no pacemaker needed according to EP.    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  Postop complete heart block pacemaker.  Dr. Lovena Le.  TIA in 2017 in the setting of subtherapeutic INR.   Dr. Claris Gladden patient in the advanced heart failure clinic.  No further syncope.  No chest pain no fevers chills.  Felt like she was clearing her throat.  No bleeding.  Occasional left arm numbness.  Dark stool noted yesterday.  Still feeling weak.  Hospitalization on 12/29/2019.  Seen by Dr. Margaretann Loveless.  Hemoglobin was decreased.  Lifelong dizziness she did state.  Atrial tachycardia noted heart rates in the 140s.  Haptoglobin previously was not elevated in March 2021.  Past Medical History:  Diagnosis Date  . Acute diastolic heart failure ([place]) 03/15/2013  . Anemia   . B12 deficiency   . Carotid stenosis    Carotid US 2/17: bilateral ICA 1-39% >> FU prn  . Childhood asthma   . Complete heart block ([place])   . Epigastric hernia 200's  . GERD (gastroesophageal reflux disease)   . Heart murmur   . History of blood transfusion    "14 w/1st pregnancy; 2 w/last C-section" (03/15/2013)  . Hx of echocardiogram    a. Echo 4/16:  Mild focal basal septal hypertrophy, EF 60-65%, no RWMA, Mechanical MVR ok with mild central regurgitation and no perivalvular leak, small mobile density attached to valvular ring (1.5x1 cm) - post surgical changes vs SBE, mild LAE;   b. Echo 2/17:  Mild post wall LVH, EF 55-60%, no RWMA, mechanical MVR ok (mean 5 mmHg), normal RVSF  . Hypertension    no pcp   will go to [facility]    . Macrocytic anemia   . 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 S/P mitral valve replacement with metallic valve [id number]   48mm Sorin Carbomedics mechanical prosthesis via right mini thoracotomy approach  . 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 Severe mitral regurgitation 04/22/2013  . Sinus tachycardia   . SVT (supraventricular tachycardia) ([place]) 03/16/2013  . TIA (transient ischemic attack)   . Tricuspid regurgitation     Past Surgical History:  Procedure Laterality Date  . APPENDECTOMY  [id number]  . CARDIAC CATHETERIZATION    . CARDIAC VALVE REPLACEMENT    . CESAREAN SECTION  1983  . CESAREAN SECTION WITH BILATERAL TUBAL LIGATION  1999  . FEMORAL HERNIA REPAIR Right 05/23/2013   Procedure: HERNIA REPAIR FEMORAL;  Surgeon: Rexene Alberts, MD;  Location: [facility];  Service: Open Heart Surgery;  Laterality: Right;  . INTRAOPERATIVE TRANSESOPHAGEAL ECHOCARDIOGRAM N/A 05/23/2013   Procedure: INTRAOPERATIVE TRANSESOPHAGEAL ECHOCARDIOGRAM;  Surgeon: Rexene Alberts, MD;  Location: [facility];  Service: Open Heart Surgery;  Laterality: N/A;  . LAPAROSCOPIC CHOLECYSTECTOMY  2003  . LEFT AND RIGHT HEART CATHETERIZATION WITH CORONARY ANGIOGRAM N/A 03/22/2013   Procedure: LEFT AND RIGHT HEART CATHETERIZATION WITH  CORONARY ANGIOGRAM;  Surgeon: Burnell Blanks, MD;  Location: [facility] CATH LAB;  Service: Cardiovascular;  Laterality: N/A;  . MINIMALLY INVASIVE TRICUSPID VALVE REPAIR Right 05/23/2013   Procedure: MINIMALLY INVASIVE TRICUSPID VALVE REPAIR;  Surgeon: Rexene Alberts, MD;  Location: [facility];  Service: Open Heart Surgery;  Laterality: Right;  . MITRAL VALVE REPLACEMENT N/A 05/23/2013   Procedure: MITRAL VALVE (MV) REPLACEMENT;  Surgeon: Rexene Alberts, MD;  Location: [facility];  Service: Open Heart Surgery;   Laterality: N/A;  . MULTIPLE EXTRACTIONS WITH ALVEOLOPLASTY N/A 04/04/2013   Procedure: Extraction of tooth #'s 1,8,9,13,14,15,23,24,25,26 with alveoloplasty and gross debridement of remaining teeth;  Surgeon: Lenn Cal, DDS;  Location: [facility];  Service: Oral Surgery;  Laterality: N/A;  . TEE WITHOUT CARDIOVERSION N/A 03/18/2013   Procedure: TRANSESOPHAGEAL ECHOCARDIOGRAM (TEE);  Surgeon: Larey Dresser, MD;  Location: [facility];  Service: Cardiovascular;  Laterality: N/A;  . TEE WITHOUT CARDIOVERSION N/A 06/17/2013   Procedure: TRANSESOPHAGEAL ECHOCARDIOGRAM (TEE);  Surgeon: Lelon Perla, MD;  Location: [facility] ENDOSCOPY;  Service: Cardiovascular;  Laterality: N/A;  . TUBAL LIGATION  1999    Current Medications: Current Meds  Medication Sig  . acetaminophen (TYLENOL) 500 MG tablet Take 1,000 mg by mouth every 6 (six) hours as needed for headache.  [name] aspirin EC 81 MG tablet Take 1 tablet (81 mg total) by mouth daily.  . Hyprom-Naphaz-Polysorb-Zn Sulf (CLEAR EYES COMPLETE OP) Place 2 drops into both eyes daily as needed (red eyes).  . [DISCONTINUED] warfarin (COUMADIN) 5 MG tablet Take 1-1.5 tablets (5-7.5 mg total) by mouth See admin instructions. Take 1 to 1.5 tablets daily as directed by Anticoagulation Clinic on Mon, Wed, Friday taking 7.5mg  and all other days 5 mg tablet.     Allergies:   Oxycodone   Social History   Socioeconomic History  . Marital status: Single    Spouse name: Not on file  . Number of children: Not on file  . Years of education: Not on file  . Highest education level: Not on file  Occupational History  . Not on file  Tobacco Use  . Smoking status: Former Smoker    Packs/day: 0.50    Years: 36.00    Pack years: 18.00    Types: Cigarettes    Quit date: 03/15/2013    Years since quitting: 6.8  . Smokeless tobacco: Never Used  Substance and Sexual Activity  . Alcohol use: Yes    Comment: 01/11/2017  "glass of wine/month"  . Drug use: No  . Sexual  activity: Never  Other Topics Concern  . Not on file  Social History Narrative  . Not on file   Social Determinants of Health   Financial Resource Strain:   . Difficulty of Paying Living Expenses:   Food Insecurity:   . Worried About Charity fundraiser in the Last Year:   . Arboriculturist in the Last Year:   Transportation Needs:   . Film/video editor (Medical):   [name] Lack of Transportation (Non-Medical):   Physical Activity:   . Days of Exercise per Week:   . Minutes of Exercise per Session:   Stress:   . Feeling of Stress :   Social Connections:   . Frequency of Communication with Friends and Family:   . Frequency of Social Gatherings with Friends and Family:   . Attends Religious Services:   . Active Member of Clubs or Organizations:   . Attends Archivist  Meetings:   [name] Marital Status:      Family History: The patient's family history includes Cancer in her mother; Sarcoidosis in her sister; Stroke in her father. There is no history of Heart attack.  ROS:   Please see the history of present illness.    Feeling weak, tired, no chest pain, no syncope all other systems reviewed and are negative.  EKGs/Labs/Other Studies Reviewed:    The following studies were reviewed today:  ECHO 12/31/19:  1. Left ventricular ejection fraction, by estimation, is 55 to 60%. The  left ventricle has normal function. The left ventricle has no regional  wall motion abnormalities. Left ventricular diastolic function could not  be evaluated.  2. Right ventricular systolic function is normal. The right ventricular  size is normal.  3. The mitral valve has been repaired/replaced. No evidence of mitral  valve regurgitation. There is a mechanical valve present in the mitral  position.  4. The tricuspid valve is has been repaired/replaced. The tricuspid valve  is status post repair with an annuloplasty ring.  5. The aortic valve is normal in structure. Aortic valve  regurgitation is  trivial. No aortic stenosis is present.   Echocardiogram 03/06/2019 EF > 65, no RWMA, s/p mechanical MVR with normal function, s/p TV repair with trivial TR  Holter 03/30/18  Normal sinus rhythm and sinus tachycardia with average heart rate 109 bpm. Heart rate ranged from 27 to 171 bpm.  Intermittent complete heart block with slow ventricular response at 47 bpm during the daytime.  SVT at 171 bpm  Occasional PVCs and wide-complex tachycardia up to 5 beats  Echocardiogram 02/06/18 EF 55-60, no RWMA, normally functioning mechanical MVR, TV repair functioning normally  Event Monitor 02/24/16 1 episode of high grade heart block at night. Otherwise, mild sinus tachycardia.   Carotid US 9/17 Bilateral: soft plaque throughout CCA, ICA and ECA. 1-39% ICA plaquing. Vertebral artery flow is antegrade.  Echo 06/06/16 EF 55-60%, mechanical MVR functioning normally, mild RAE, TV repair functioning normally  Echo 11/03/15 Posterior wall mild LVH, EF 55-60%, normal wall motion, mechanical MVR okay, normal RVSF  Echo 01/01/15 Mild focal basal septal hypertrophy, EF 60-65%, normal wall motion, S/P MVR (St Jude mechanical valve) functioning well, mild MR (small mobile echodensity-likely redundant chord versus suture), mild LAE, TV mean gradient 4 mmHg.   Echo 7/15 Mild LVH, EF 60-65%, normal wall motion, grade 1 diastolic dysfunction, S/P MVR (St Jude mechanical valve) and appears functioning well, mild MR, no perivalvular leak, mild LAE, mildly reduced RVSF.   Carotid US 8/14 Bilateral - 1% to 39% ICA stenosis   Cardiac cath 6/14 Left main: No obstructive disease.  Left Anterior Descending Artery: No obstructive disease noted.  Circumflex Artery: No obstructive disease.  Right Coronary Artery: no obstructive disease.  Left Ventricular Angiogram: LVEF 65-70%. 3+ MR Distal Aortogram: No aneurysm. No stenosis.  EKG:  EKG today shows sinus tachycardia  124.  Recent Labs: 12/29/2019: ALT 22; B Natriuretic Peptide 44.5; TSH 4.393 12/31/2019: Magnesium 1.7 01/01/2020: BUN 6; Creatinine, Ser 0.79; Hemoglobin 8.2; Platelets 296; Potassium 3.6; Sodium 141  Recent Lipid Panel    Component Value Date/Time   CHOL 189 05/31/2018 1108   TRIG 300 (H) 05/31/2018 1108   HDL 68 05/31/2018 1108   CHOLHDL 2.8 05/31/2018 1108   CHOLHDL 3.2 06/06/2016 0135   VLDL 22 06/06/2016 0135   LDLCALC 61 05/31/2018 1108    Physical Exam:    VS:  BP 100/60   Pulse [name])  125   Ht 5\' 2"  (1.575 m)   Wt 117 lb 12.8 oz (53.4 kg)   LMP 03/22/2013   SpO2 100%   BMI 21.55 kg/m     Wt Readings from Last 3 Encounters:  01/08/20 117 lb 12.8 oz (53.4 kg)  12/31/19 117 lb 8.3 oz (53.3 kg)  12/11/19 122 lb (55.3 kg)     GEN:  Well nourished, well developed in no acute distress HEENT: Normal NECK: No JVD; No carotid bruits LYMPHATICS: No lymphadenopathy CARDIAC: tachy reg S1 click, no murmurs, rubs, gallops RESPIRATORY:  Clear to auscultation without rales, wheezing or rhonchi  ABDOMEN: Soft, non-tender, non-distended MUSCULOSKELETAL:  No edema; No deformity  SKIN: Warm and dry, pale NEUROLOGIC:  Alert and oriented x 3 PSYCHIATRIC:  Normal affect   ASSESSMENT:    1. Anemia, unspecified type   2. Mitral valve disorder   3. S/P mitral valve replacement with metallic valve    PLAN:    In order of problems listed above:  Syncope with intermittent heart -Was not able to complete loop recorder because of financial issues.  Dr. Lovena Le recently saw.  Holding off on pacemaker for now he states.  Mitral valve mechanical valve -Continue with INR goal 2.5-3.5.  Has had issues in the past with Coumadin clinic.  No evidence of mitral valve dysfunction.  No evidence of hemolysis.  She had 1 unit of packed red blood cells.  I will check today a stat CBC.  Tricuspid valve repair -Stable.  Anemia fatigue -Hemoglobin 8.2 on 05/02/2020 -She complained of dark stools  yesterday.  Hemoglobin was as low as 6.9 on 12/31/2019.  Hemoglobin on discharge was 8.2. -Stat GI referral.  I suggested she go back to the hospital for evaluation.  She is not interested. It looks like in the discharge summary, Dr. Lucianne Lei did write in her note referral to GI for routine colonoscopy.  I do not see that she has seen Dr. Chapman Fitch yet either.  If her hemoglobin is markedly diminished today with stat CBC I will once again recommend that she go to the emergency room for further evaluation.  GI evaluation.  She states that she has some rental issues that need to be taken care of today.  Richardson Dopp in 2 weeks follow-up.   Medication Adjustments/Labs and Tests Ordered: Current medicines are reviewed at length with the patient today.  Concerns regarding medicines are outlined above.  Orders Placed This Encounter  Procedures  . CBC  . Ambulatory referral to Gastroenterology  . EKG 12-Lead   No orders of the defined types were placed in this encounter.   Patient Instructions  Medication Instructions:  The current medical regimen is effective;  continue present plan and medications.  *If you need a refill on your cardiac medications before your next appointment, please call your pharmacy*  Lab:  Please have stat blood work today (CBC)  You have been referred to Gastroenterology for the evaluation of anemia.  Follow-Up: At [facility], you and your health needs are our priority.  As part of our continuing mission to provide you with exceptional heart care, we have created designated Provider Care Teams.  These Care Teams include your primary Cardiologist (physician) and Advanced Practice Providers (APPs -  Physician Assistants and Nurse Practitioners) who all work together to provide you with the care you need, when you need it.  We recommend signing up for the patient portal called "MyChart".  Sign up information is provided on this After  Visit Summary.  MyChart is used to connect  with patients for Virtual Visits (Telemedicine).  Patients are able to view lab/test results, encounter notes, upcoming appointments, etc.  Non-urgent messages can be sent to your provider as well.   To learn more about what you can do with MyChart, go to [email or web address].    Your next appointment:   2 week(s)  The format for your next appointment:   In Person  Provider:   Richardson Dopp   Thank you for choosing [facility]!!         Signed, Candee Furbish, MD  01/08/2020 9:35 AM    [place]

## 2020-01-08 NOTE — Telephone Encounter (Signed)
The patient has been notified of the result and verbalized understanding.  All questions (if any) were answered. Pt states she has GI appt tomorrow.  Wilma Flavin, RN 01/08/2020 1:25 PM

## 2020-01-09 ENCOUNTER — Ambulatory Visit (INDEPENDENT_AMBULATORY_CARE_PROVIDER_SITE_OTHER): Payer: Medicaid Other | Admitting: Physician Assistant

## 2020-01-09 ENCOUNTER — Encounter: Payer: Self-pay | Admitting: Physician Assistant

## 2020-01-09 ENCOUNTER — Telehealth: Payer: Self-pay | Admitting: General Surgery

## 2020-01-09 VITALS — BP 102/70 | HR 128 | Temp 97.0°F | Ht 62.0 in | Wt 117.0 lb

## 2020-01-09 DIAGNOSIS — D508 Other iron deficiency anemias: Secondary | ICD-10-CM

## 2020-01-09 DIAGNOSIS — R12 Heartburn: Secondary | ICD-10-CM

## 2020-01-09 DIAGNOSIS — Z01818 Encounter for other preprocedural examination: Secondary | ICD-10-CM

## 2020-01-09 DIAGNOSIS — Z7901 Long term (current) use of anticoagulants: Secondary | ICD-10-CM

## 2020-01-09 MED ORDER — NA SULFATE-K SULFATE-MG SULF 17.5-3.13-1.6 GM/177ML PO SOLN: 1.0000 | Freq: Once | ORAL | 0 refills | Status: AC

## 2020-01-09 MED ORDER — ESOMEPRAZOLE MAGNESIUM 40 MG PO CPDR: DELAYED_RELEASE_CAPSULE | ORAL | 0 refills | Status: DC

## 2020-01-09 NOTE — Telephone Encounter (Signed)
Pharmacy, can you please comment on how long patient can hold Eliquis for upcoming procedure and EGD/colonscopy and if she will need Lovenox bridge?  Thank you!

## 2020-01-09 NOTE — Telephone Encounter (Signed)
Patient with diagnosis of mechanical mitral valve on warfarin for anticoagulation.    Procedure: Endo/Colon  Date of procedure: 02/03/20  CrCl 62 ml/min Platelet count 386  Per office protocol, patient can hold warfarin for 6 days prior to procedure.    Patient WILL need bridging with Lovenox (enoxaparin) around procedure.  Pharmacy will coordinate at her next coumadin clinic appointment.

## 2020-01-09 NOTE — Progress Notes (Signed)
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      01/08/2020 CBC with a hemoglobin of 10.6 (8.2 7 days prior to that-underwent blood transfusion 01/02/2020)    01/08/2020 office visit with cardiology for anemia.  At that time discussed patient had mitral valve disease with mechanical mitral valve and tricuspid valve repair.  Patient describes a dark stool the day before.    Today, the patient presents to clinic and tells me that she has "always been anemic".  She tells me they have never figured out why this is.  Tells me she has always monitored her stool since being on a blood thinner in 2014 and has never seen any color change other than Monday, 01/06/2020 when she had 2 small black hard stools, none since then.  Explains that she underwent recent blood transfusion.  She feels fairly well with no increased fatigue or palpitations.  Does tell me she has daily reflux symptoms which they tried to treat with Pepcid in the past which did not help.  These are almost all day long.    Denies fever, chills, weight loss, abdominal pain or shortness of breath.  Past Medical History:  Diagnosis Date  . Acute diastolic heart failure ([place]) 03/15/2013  . Anemia   . B12 deficiency   . Carotid stenosis    Carotid US 2/17: bilateral ICA 1-39% >> FU prn  . Childhood asthma   . Complete heart block ([place])   . Epigastric hernia 200's  . GERD (gastroesophageal reflux disease)   . Heart murmur   . History of blood transfusion    "14 w/1st pregnancy; 2 w/last C-section" (03/15/2013)  . Hx of echocardiogram    a. Echo 4/16:  Mild focal basal septal hypertrophy, EF 60-65%, no RWMA, Mechanical  MVR ok with mild central regurgitation and no perivalvular leak, small mobile density attached to valvular ring (1.5x1 cm) - post surgical changes vs SBE, mild LAE;   b. Echo 2/17:  Mild post wall LVH, EF 55-60%, no RWMA, mechanical MVR ok (mean 5 mmHg), normal RVSF  . Hypertension    no pcp   will go to [facility]  . Macrocytic anemia   . 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 S/P mitral valve replacement with metallic valve [id number]   33mm Sorin Carbomedics mechanical prosthesis via right mini thoracotomy approach  . 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 Severe mitral regurgitation 04/22/2013  . Sinus tachycardia   . SVT (supraventricular tachycardia) ([place]) 03/16/2013  . TIA (transient ischemic attack)   . Tricuspid regurgitation     Past Surgical History:  Procedure Laterality Date  . APPENDECTOMY  [id number]  . CARDIAC CATHETERIZATION    . CARDIAC VALVE REPLACEMENT    . CESAREAN SECTION  1983  . CESAREAN SECTION WITH BILATERAL TUBAL LIGATION  1999  . FEMORAL HERNIA REPAIR Right 05/23/2013   Procedure: HERNIA REPAIR FEMORAL;  Surgeon: Rexene Alberts, MD;  Location: [place];  Service: Open Heart Surgery;  Laterality: Right;  . INTRAOPERATIVE  TRANSESOPHAGEAL ECHOCARDIOGRAM N/A 05/23/2013   Procedure: INTRAOPERATIVE TRANSESOPHAGEAL ECHOCARDIOGRAM;  Surgeon: Rexene Alberts, MD;  Location: [place];  Service: Open Heart Surgery;  Laterality: N/A;  . LAPAROSCOPIC CHOLECYSTECTOMY  2003  . LEFT AND RIGHT HEART CATHETERIZATION WITH CORONARY ANGIOGRAM N/A 03/22/2013   Procedure: LEFT AND RIGHT HEART CATHETERIZATION WITH CORONARY ANGIOGRAM;  Surgeon: Burnell Blanks, MD;  Location[facility] CATH LAB;  Service: Cardiovascular;  Laterality: N/A;  . MINIMALLY INVASIVE TRICUSPID VALVE REPAIR Right 05/23/2013   Procedure:  MINIMALLY INVASIVE TRICUSPID VALVE REPAIR;  Surgeon: Rexene Alberts, MD;  Location: [place];  Service: Open Heart Surgery;  Laterality: Right;  . MITRAL VALVE REPLACEMENT N/A 05/23/2013   Procedure: MITRAL VALVE (MV) REPLACEMENT;  Surgeon: Rexene Alberts, MD;  Location: [place];  Service: Open Heart Surgery;  Laterality: N/A;  . MULTIPLE EXTRACTIONS WITH ALVEOLOPLASTY N/A 04/04/2013   Procedure: Extraction of tooth #'s 1,8,9,13,14,15,23,24,25,26 with alveoloplasty and gross debridement of remaining teeth;  Surgeon: Lenn Cal, DDS;  Location: [place];  Service: Oral Surgery;  Laterality: N/A;  . TEE WITHOUT CARDIOVERSION N/A 03/18/2013   Procedure: TRANSESOPHAGEAL ECHOCARDIOGRAM (TEE);  Surgeon: Larey Dresser, MD;  Location: [place];  Service: Cardiovascular;  Laterality: N/A;  . TEE WITHOUT CARDIOVERSION N/A 06/17/2013   Procedure: TRANSESOPHAGEAL ECHOCARDIOGRAM (TEE);  Surgeon: Lelon Perla, MD;  Location: [facility] ENDOSCOPY;  Service: Cardiovascular;  Laterality: N/A;  . TUBAL LIGATION  1999    Current Outpatient Medications  Medication Sig Dispense Refill  . acetaminophen (TYLENOL) 500 MG tablet Take 1,000 mg by mouth every 6 (six) hours as needed for headache.    [name] aspirin EC 81 MG tablet Take 1 tablet (81 mg total) by mouth daily.    . Hyprom-Naphaz-Polysorb-Zn Sulf (CLEAR EYES COMPLETE OP) Place 2 drops into both eyes daily as needed (red eyes).    . warfarin (COUMADIN) 5 MG tablet Take as directed by Coumadin Clinic 40 tablet 1   No current facility-administered medications for this visit.    Allergies as of 01/09/2020 - Review Complete 01/09/2020  Allergen Reaction Noted  . Oxycodone Itching 04/15/2013    Family History  Problem Relation Age of Onset  . Stroke Father   . Cancer Father   . Sarcoidosis Sister   . Heart attack Neg Hx   . Colon cancer Neg Hx   . Stomach cancer Neg Hx   . Pancreatic cancer Neg Hx   . Esophageal cancer Neg Hx     Social History    Socioeconomic History  . Marital status: Single    Spouse name: Not on file  . Number of children: Not on file  . Years of education: Not on file  . Highest education level: Not on file  Occupational History  . Not on file  Tobacco Use  . Smoking status: Former Smoker    Packs/day: 0.50    Years: 36.00    Pack years: 18.00    Types: Cigarettes    Quit date: 03/15/2013    Years since quitting: 6.8  . Smokeless tobacco: Never Used  Substance and Sexual Activity  . Alcohol use: Yes    Comment:  "glass of wine"  . Drug use: No  . Sexual activity: Never  Other Topics Concern  . Not on file  Social History Narrative  . Not on file   Social Determinants of Health   Financial Resource Strain:   . Difficulty of Paying Living Expenses:   Food Insecurity:   .  Worried About Charity fundraiser in the Last Year:   . Arboriculturist in the Last Year:   Transportation Needs:   . Film/video editor (Medical):   [name] Lack of Transportation (Non-Medical):   Physical Activity:   . Days of Exercise per Week:   . Minutes of Exercise per Session:   Stress:   . Feeling of Stress :   Social Connections:   . Frequency of Communication with Friends and Family:   . Frequency of Social Gatherings with Friends and Family:   . Attends Religious Services:   . Active Member of Clubs or Organizations:   . Attends Archivist Meetings:   [name] Marital Status:   Intimate Partner Violence:   . Fear of Current or Ex-Partner:   . Emotionally Abused:   [name] Physically Abused:   . Sexually Abused:     Review of Systems:    Constitutional: No weight loss, fever or chills Skin: No rash  Cardiovascular: No chest pain Respiratory: No SOB  Gastrointestinal: See HPI and otherwise negative Genitourinary: No dysuria Neurological: No headache, dizziness or syncope Musculoskeletal: No new muscle or joint pain Hematologic: No bleeding  Psychiatric: No history of depression or anxiety    Physical Exam:  Vital signs: BP 102/70 (BP Location: Left Arm, Patient Position: Sitting, Cuff Size: Normal)   Pulse (!) 128   Temp (!) 97 F (36.1 C)   Ht 5\' 2"  (1.575 m)   Wt 117 lb (53.1 kg)   LMP 03/22/2013   SpO2 99%   BMI 21.40 kg/m   Constitutional:   Pleasant AA female appears to be in NAD, Well developed, Well nourished, alert and cooperative Head:  Normocephalic and atraumatic. Eyes:   PEERL, EOMI. No icterus. Conjunctiva pink. Ears:  Normal auditory acuity. Neck:  Supple Throat: Oral cavity and pharynx without inflammation, swelling or lesion.  Respiratory: Respirations even and unlabored. Lungs clear to auscultation bilaterally.   No wheezes, crackles, or rhonchi.  Cardiovascular: Normal S1, S2. No MRG. Regular rate and rhythm. No peripheral edema, cyanosis or pallor.  Gastrointestinal:  Soft, nondistended, mild generalized ttp. No rebound or guarding. Normal bowel sounds. No appreciable masses or hepatomegaly. Rectal:  Not performed.  Msk:  Symmetrical without gross deformities. Without edema, no deformity or joint abnormality.  Neurologic:  Alert and  oriented x4;  grossly normal neurologically.  Skin:   Dry and intact without significant lesions or rashes. Psychiatric: Demonstrates good judgement and reason without abnormal affect or behaviors.  RELEVANT LABS AND IMAGING: CBC    Component Value Date/Time   WBC 7.9 01/08/2020 0920   WBC 6.3 01/01/2020 0503   RBC 2.85 (L) 01/08/2020 0920   RBC 2.19 (L) 01/01/2020 1125   RBC 2.19 (L) 01/01/2020 0503   HGB 10.6 (L) 01/08/2020 0920   HCT 29.9 (L) 01/08/2020 0920   PLT 386 01/08/2020 0920   MCV 105 (H) 01/08/2020 0920   MCH 37.2 (H) 01/08/2020 0920   MCH 37.4 (H) 01/01/2020 0503   MCHC 35.5 01/08/2020 0920   MCHC 33.2 01/01/2020 0503   RDW 17.9 (H) 01/08/2020 0920   LYMPHSABS 1.0 01/01/2020 0503   LYMPHSABS 1.0 12/11/2019 1141   MONOABS 0.5 01/01/2020 0503   EOSABS 0.3 01/01/2020 0503   EOSABS 0.0 12/11/2019  1141   BASOSABS 0.0 01/01/2020 0503   BASOSABS 0.0 12/11/2019 1141    CMP     Component Value Date/Time   NA 141 01/01/2020 0503   NA  122 (L) 12/11/2019 1141   K 3.6 01/01/2020 0503   CL 117 (H) 01/01/2020 0503   CO2 15 (L) 01/01/2020 0503   GLUCOSE 84 01/01/2020 0503   BUN 6 01/01/2020 0503   BUN 15 12/11/2019 1141   CREATININE 0.79 01/01/2020 0503   CREATININE 0.82 10/21/2015 0925   CALCIUM 7.6 (L) 01/01/2020 0503   PROT 5.8 (L) 12/29/2019 1351   PROT 6.1 12/11/2019 1141   ALBUMIN 2.5 (L) 12/29/2019 1351   ALBUMIN 3.0 (L) 12/11/2019 1141   AST 40 12/29/2019 1351   ALT 22 12/29/2019 1351   ALKPHOS 124 12/29/2019 1351   BILITOT 0.7 12/29/2019 1351   BILITOT 0.5 12/11/2019 1141   GFRNONAA >60 01/01/2020 0503   GFRNONAA 36 (L) 04/11/2013 1237   GFRAA >60 01/01/2020 0503   GFRAA 41 (L) 04/11/2013 1237    Assessment: 1.  IDA: Chronic anemia, though this seems to worsen lately with description of black stools; concern for GI source of blood loss 2.  Heartburn: Chronic for the patient 3.  Status post mechanical mitral valve placement: Maintained on Coumadin  Plan: 1.  Scheduled patient for an EGD and colonoscopy in the [place] with Dr. Havery Moros.  Did discuss risks, benefits, limitations and alternatives and the patient agrees to proceed.  Would like this scheduled within the next couple of weeks given patient's severe anemia with need for blood transfusion recently. 2.  Started the patient on Nexium 40 mg daily, 30 to 60 minutes before breakfast #30 with 5 refills. 3.  Patient was advised to hold her Coumadin for 6 days prior to procedure.  We will communicate with her prescribing physician to ensure that this is acceptable for her.  Explained that likely with her mitral valve she will need to be on Lovenox.  This will be coordinated with the Coumadin clinic and her physician. 4.  Patient to follow in clinic per recommendations from Dr. Havery Moros after time of  procedures.  Ellouise Newer, PA-C [facility] 01/09/2020, 1:28 PM  Cc: Jerline Pain, MD

## 2020-01-09 NOTE — Telephone Encounter (Signed)
   Primary Cardiologist: Candee Furbish, MD  Chart reviewed as part of pre-operative protocol coverage. Patient is scheduled to have a EGD/colonoscopy on 02/03/2020 and we were asked to give our recommendations for holding Coumadin.  Per Pharmacy and office protocol, "Patient can hold warfarin for 6 days prior to procedure. Patient WILL need bridging with Lovenox (enoxaparin) around procedure. Pharmacy will coordinate at her next coumadin clinic appointment."  Patient has follow-up visit with Richardson Dopp, PA-C, prior to procedure on 01/22/2020. Therefore, additional pre-op risk assessment can be performed at that time.   I will route this recommendation to the requesting party via Epic fax function and remove from pre-op pool.  Please call with questions.  Darreld Mclean, PA-C 01/09/2020, 2:51 PM

## 2020-01-09 NOTE — Telephone Encounter (Signed)
[facility] Pre-operative Risk Assessment     Request for surgical clearance:     Endoscopy Procedure  What type of surgery is being performed?     Endo/Colon  When is this surgery scheduled?     02/03/2020  What type of clearance is required ?   Pharmacy  Are there any medications that need to be held prior to surgery and how long?  Coumadin for 6 says with a possible Lovenox bridge.  Practice name and name of physician performing surgery?      [facility]  What is your office phone and fax number?      Phone- [phone]  [phone]  Anesthesia type (None, local, MAC, general) ?       MAC

## 2020-01-09 NOTE — Patient Instructions (Addendum)
If you are age 58 or older, your body mass index should be between 23-30. Your Body mass index is 21.4 kg/m. If this is out of the aforementioned range listed, please consider follow up with your Primary Care Provider.  If you are age 45 or younger, your body mass index should be between 19-25. Your Body mass index is 21.4 kg/m. If this is out of the aformentioned range listed, please consider follow up with your Primary Care Provider.   We have sent the following medications to your pharmacy for you to pick up at your convenience:  Suprep ( colonoscopy prep) nexium 40 mg daily  Due to recent changes in healthcare laws, you may see the results of your imaging and laboratory studies on MyChart before your provider has had a chance to review them.  We understand that in some cases there may be results that are confusing or concerning to you. Not all laboratory results come back in the same time frame and the provider may be waiting for multiple results in order to interpret others.  Please give [place] 48 hours in order for your provider to thoroughly review all the results before contacting the office for clarification of your results.   You will be contaced by our office prior to your procedure for directions on holding your Coumadin/Warfarin.  If you do not hear from our office 1 week prior to your scheduled procedure, please call [phone] to discuss.

## 2020-01-10 NOTE — Progress Notes (Signed)
Agree with assessment and plan as outlined.  

## 2020-01-14 NOTE — Telephone Encounter (Signed)
Spoke with Beya and explained we have received notification it is okay to stop her Warfarin for 6 days and have a Lovenox bridge. Expressed to her she has an appointment with Richardson Dopp, PA to discuss Lovenox bridge and it is important to keep this appointment. The patient verbalized understanding.

## 2020-01-21 ENCOUNTER — Telehealth (HOSPITAL_COMMUNITY): Payer: Self-pay | Admitting: Licensed Clinical Social Worker

## 2020-01-21 NOTE — Telephone Encounter (Signed)
CSW received call from pt requesting assistance with transportation to [facility] appt tomorrow.  CSW sent in request for [facility] to take to appt.  Pt also reported pain her legs and tingling sensation in her lips.  CSW encouraged her to call PCP office to discuss and see if she needs to go to urgent care or get appt set up to get evaluated- pt expressed understanding and will plan to call this afternoon.  CSW will continue to follow and assist as needed  Jorge Ny, [facility] Desk#: [phone] Cell#: [phone]

## 2020-01-22 ENCOUNTER — Ambulatory Visit (INDEPENDENT_AMBULATORY_CARE_PROVIDER_SITE_OTHER): Payer: Self-pay | Admitting: Physician Assistant

## 2020-01-22 ENCOUNTER — Encounter: Payer: Self-pay | Admitting: Physician Assistant

## 2020-01-22 ENCOUNTER — Other Ambulatory Visit: Payer: Self-pay

## 2020-01-22 ENCOUNTER — Ambulatory Visit (INDEPENDENT_AMBULATORY_CARE_PROVIDER_SITE_OTHER): Payer: Self-pay | Admitting: Pharmacist

## 2020-01-22 VITALS — BP 104/64 | HR 135 | Ht 62.0 in | Wt 115.0 lb

## 2020-01-22 DIAGNOSIS — Z7901 Long term (current) use of anticoagulants: Secondary | ICD-10-CM

## 2020-01-22 DIAGNOSIS — M79605 Pain in left leg: Secondary | ICD-10-CM

## 2020-01-22 DIAGNOSIS — I059 Rheumatic mitral valve disease, unspecified: Secondary | ICD-10-CM

## 2020-01-22 DIAGNOSIS — Z954 Presence of other heart-valve replacement: Secondary | ICD-10-CM

## 2020-01-22 DIAGNOSIS — M79604 Pain in right leg: Secondary | ICD-10-CM

## 2020-01-22 DIAGNOSIS — D539 Nutritional anemia, unspecified: Secondary | ICD-10-CM

## 2020-01-22 DIAGNOSIS — R000 Tachycardia, unspecified: Secondary | ICD-10-CM

## 2020-01-22 LAB — POCT INR: INR: 4.3 — AB (ref 2.0–3.0)

## 2020-01-22 MED ORDER — ENOXAPARIN SODIUM 60 MG/0.6ML ~~LOC~~ SOLN: 60.0000 mg | Freq: Two times a day (BID) | SUBCUTANEOUS | 0 refills | Status: DC

## 2020-01-22 NOTE — Patient Instructions (Signed)
Medication Instructions:   Your physician recommends that you continue on your current medications as directed. Please refer to the Current Medication list given to you today.  *If you need a refill on your cardiac medications before your next appointment, please call your pharmacy*   Lab Work: CBC TODAY   If you have labs (blood work) drawn today and your tests are completely normal, you will receive your results only by: [name] MyChart Message (if you have MyChart) OR . A paper copy in the mail If you have any lab test that is abnormal or we need to change your treatment, we will call you to review the results.   Testing/Procedures: NONE ORDERED  TODAY  Follow-Up: At [facility], you and your health needs are our priority.  As part of our continuing mission to provide you with exceptional heart care, we have created designated Provider Care Teams.  These Care Teams include your primary Cardiologist (physician) and Advanced Practice Providers (APPs -  Physician Assistants and Nurse Practitioners) who all work together to provide you with the care you need, when you need it.  We recommend signing up for the patient portal called "MyChart".  Sign up information is provided on this After Visit Summary.  MyChart is used to connect with patients for Virtual Visits (Telemedicine).  Patients are able to view lab/test results, encounter notes, upcoming appointments, etc.  Non-urgent messages can be sent to your provider as well.   To learn more about what you can do with MyChart, go to [email or web address].    Your next appointment:   3 month(s)  The format for your next appointment:   In Person  Provider:   You may see Candee Furbish, MD or one of the following Advanced Practice Providers on your designated Care Team:   Jerel Shepherd PA-C    Other Instructions  PLEASE FOLLOW UP WITH PRIMARY CARE DOCTOR  FOR LEG PAIN AND ANEMIA

## 2020-01-22 NOTE — Patient Instructions (Signed)
01/28/20: Last dose of Coumadin.  01/29/20: No Coumadin or Lovenox.  01/30/20: Inject Lovenox 60mg  in the fatty abdominal tissue at least 2 inches from the belly button twice a day about 12 hours apart, 8am and 8pm rotate sites. No Coumadin.  01/31/20: Inject Lovenox in the fatty tissue every 12 hours, 8am and 8pm. No Coumadin.  02/01/20: Inject Lovenox in the fatty tissue every 12 hours, 8am and 8pm. No Coumadin.  02/02/20: Inject Lovenox in the fatty tissue in the morning at 8 am (No PM dose). No Coumadin.  02/03/20: Procedure Day - No Lovenox - Resume Coumadin in the evening or as directed by doctor (take an extra half tablet with usual dose for 2 days then resume normal dose).  02/04/20: Resume Lovenox inject in the fatty tissue every 12 hours and take Coumadin.  02/05/20: Inject Lovenox in the fatty tissue every 12 hours and take Coumadin.  02/06/20: Inject Lovenox in the fatty tissue every 12 hours and take Coumadin.  02/07/20: Inject Lovenox in the fatty tissue every 12 hours and take Coumadin. Coumadin appt to check INR.

## 2020-01-22 NOTE — Progress Notes (Signed)
Cardiology Office Note:    Date:  01/22/2020   ID:  Mary [name], DOB December 26, 1960, MRN [id number]  PCP:  Antony Blackbird, MD  Cardiologist:  Candee Furbish, MD / Richardson Dopp, PA-C  Electrophysiologist:  Cristopher Peru, MD   Referring MD: Antony Blackbird, MD   Chief Complaint:  Follow-up (s/p mechanical MVR, anemia )    Patient Profile:    Mary [name] is a 59 y.o. female with:   Severe MR (in setting of SBE) June 2014 ? S/p R mini-thoracotomy with mechanical MVR and TV repair 6/14 ? Post op c/b CHB >> resolved; no pacemaker needed ? Echocardiogram 02/2019: normal EF, normally functioning MVR and TV repair ? Echo 12/2019: EF 55-60, stable MVR, TV repair  Transient Complete Heart Block ? Holter 03/2014: asymptomatic CHB during sleep >> seen by EP Lovena Le); no PPM needed ? Holter 7/19: normal sinus rhythm, intermittent CHB during daytime >> seen by EP; PPM recommended; patient never called back to arrange ? Last seen by Dr. Lovena Le 11/2019: no pacer indicated   Hx of syncope 03/2019; ILR recommended; no insurance coverage  S/p TIA in 05/2016 (sub therapeutic INR)  Sinus tachycardia ? Beta blocker tried in 5/17 but stopped due to high grade heart block >> seen by EP; no PPM needed  Cardiac Catheterization2014: No CAD  Macrocytic anemia2/2 B12 deficiency  Prior CV studies: Echocardiogram 12/31/2019 EF 55-60, no R WMA, normal RV SF, mitral valve replacement without evidence of regurgitation, tricuspid valve repair stable  Echocardiogram 03/06/2019 EF > 65, no RWMA, s/p mechanical MVR with normal function, s/p TV repair with trivial TR  Holter 03/30/18  Normal sinus rhythm and sinus tachycardia with average heart rate 109 bpm. Heart rate ranged from 27 to 171 bpm.  Intermittent complete heart block with slow ventricular response at 47 bpm during the daytime.  SVT at 171 bpm  Occasional PVCs and wide-complex tachycardia up to 5 beats  Echocardiogram 02/06/18 EF 55-60, no RWMA,  normally functioning mechanical MVR, TV repair functioning normally  Event Monitor 02/24/16 1 episode of high grade heart block at night. Otherwise, mild sinus tachycardia.   Carotid US 9/17 Bilateral: soft plaque throughout CCA, ICA and ECA. 1-39% ICA plaquing. Vertebral artery flow is antegrade.  Echo 06/06/16 EF 55-60%, mechanical MVR functioning normally, mild RAE, TV repair functioning normally  Echo 11/03/15 Posterior wall mild LVH, EF 55-60%, normal wall motion, mechanical MVR okay, normal RVSF  Echo 01/01/15 Mild focal basal septal hypertrophy, EF 60-65%, normal wall motion, S/P MVR (St Jude mechanical valve) functioning well, mild MR (small mobile echodensity-likely redundant chord versus suture), mild LAE, TV mean gradient 4 mmHg.   Echo 7/15 Mild LVH, EF 60-65%, normal wall motion, grade 1 diastolic dysfunction, S/P MVR (St Jude mechanical valve) and appears functioning well, mild MR, no perivalvular leak, mild LAE, mildly reduced RVSF.   Carotid US 8/14 Bilateral - 1% to 39% ICA stenosis   Cardiac cath 6/14 Left main: No obstructive disease.  Left Anterior Descending Artery: No obstructive disease noted.  Circumflex Artery: No obstructive disease.  Right Coronary Artery: no obstructive disease.  Left Ventricular Angiogram: LVEF 65-70%. 3+ MR Distal Aortogram: No aneurysm. No stenosis.  History of Present Illness:    Mary [name] was previously followed by Dr. Aundra Dubin. She was transitioned to Dr. Saunders Revel after Dr. Aundra Dubin switched to the [facility]. She has had issues with transient, asymptomatic complete heart block that occurred during sleep since her valve surgery. She was not previously  felt to require pacemaker implantation. However, in 03/2018, she was felt to be symptomatic and had intermittent CHB during the day. She was seen by EP and PPM was recommended. However, she never called back to schedule this.   She was then admitted 03/2019 with syncope.  She was  seen byDr. Lovena Le for evaluation.  He favored ILR implantation.  Her insurance would not cover this.  Therefore, she has not undergone ILR implantation.   She was seen by Dr. Lovena Le in March 2021.  She noted significant fatigue and weakness.  It was not felt that she needed pacemaker placement at that time.  She was admitted 4/4-4/7 with symptomatic anemia as well as dehydration related to gastroenteritis with associated acute kidney injury.  She received 1 unit of packed red blood cells.  Hemoglobin was as low as 6.9.  She was seen by cardiology.  Echocardiogram did not demonstrate any evidence of hemolysis related to her mechanical valve.  Hemolysis work-up was also unremarkable.  She saw Dr. Marlou Porch for follow-up 01/08/2020.  She noted dark stools and was recommended she go to the emergency room.  The patient declined.  Stat referral was placed for gastroenterology.   Hemoglobin was proved at 10.6.  She was seen by gastroenterology 01/09/2020.  EGD/colonoscopy are planned.  She will need to be off of her warfarin.  Lovenox bridge will be arranged with our pharmacy clinic.  She returns for follow-up.  She is here alone.  She continues to feel weak.  She has shortness of breath with exertion.  This is chronic without change.  She has not had chest pain or syncope.  She has not seen any blood in her stool or dark tarry stools.  She has a poor appetite.  She also notes recent leg pain.  She also has some numbness and weakness.    Past Medical History:  Diagnosis Date   Acute diastolic heart failure ([place]) 03/15/2013   Anemia    B12 deficiency    Carotid stenosis    Carotid US 2/17: bilateral ICA 1-39% >> FU prn   Childhood asthma    Complete heart block ([facility])    Epigastric hernia 200's   GERD (gastroesophageal reflux disease)    Heart murmur    History of blood transfusion    "14 w/1st pregnancy; 2 w/last C-section" (03/15/2013)   Hx of echocardiogram    a. Echo 4/16:  Mild focal  basal septal hypertrophy, EF 60-65%, no RWMA, Mechanical MVR ok with mild central regurgitation and no perivalvular leak, small mobile density attached to valvular ring (1.5x1 cm) - post surgical changes vs SBE, mild LAE;   b. 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   aspirin EC 81 MG tablet Take 1 tablet (81 mg total) by mouth daily.   esomeprazole (NEXIUM) 40 MG capsule Take 30-60 minutes before breakfast   Hyprom-Naphaz-Polysorb-Zn Sulf (CLEAR EYES COMPLETE OP) Place 2 drops into both eyes daily as needed (red eyes).   warfarin (COUMADIN) 5 MG tablet Take as directed by Coumadin Clinic     Allergies:   Oxycodone   Social History   Tobacco Use   Smoking status: Former Smoker    Packs/day: 0.50    Years: 36.00    Pack years: 18.00    Types: Cigarettes    Quit date: 03/15/2013    Years since quitting: 6.8   Smokeless tobacco: Never Used  Substance Use Topics   Alcohol use: Yes    Comment:  "glass of wine"   Drug use: No     Family Hx: The patient's family history includes Cancer in her father;  Sarcoidosis in her sister; Stroke in her father. There is no history of Heart attack, Colon cancer, Stomach cancer, Pancreatic cancer, or Esophageal cancer.  ROS   EKGs/Labs/Other Test Reviewed:    EKG:  EKG is not ordered today.  The ekg ordered today demonstrates n/a  Recent Labs: 12/29/2019: ALT 22; B Natriuretic Peptide 44.5; TSH 4.393 12/31/2019: Magnesium 1.7 01/01/2020: BUN 6; Creatinine, Ser 0.79; Potassium 3.6; Sodium 141 01/08/2020: Hemoglobin 10.6; Platelets 386   Recent Lipid Panel Lab Results  Component Value Date/Time   CHOL 189 05/31/2018 11:08 AM   TRIG 300 (H) 05/31/2018 11:08 AM   HDL 68 05/31/2018 11:08 AM   CHOLHDL 2.8 05/31/2018 11:08 AM   CHOLHDL 3.2 06/06/2016 01:35 AM   LDLCALC 61 05/31/2018 11:08 AM    Physical Exam:    VS:  BP 104/64    Pulse (!) 135    Ht 5\' 2"  (1.575 m)    Wt 115 lb (52.2 kg)    LMP 03/22/2013    SpO2 98%    BMI 21.03 kg/m     Wt Readings from Last 3 Encounters:  01/22/20 115 lb (52.2 kg)  01/09/20 117 lb (53.1 kg)  01/08/20 117 lb 12.8 oz (53.4 kg)     Constitutional:      Appearance: Healthy appearance. Not in distress.  Pulmonary:     Breath sounds: No wheezing. No rales.  Cardiovascular:     Tachycardia present. Regular rhythm. S1. Mechanical S1 Normal S2.     Murmurs: There is no murmur.  Edema:    Peripheral edema absent.  Abdominal:     Palpations: Abdomen is soft.  Musculoskeletal:     Cervical back: Neck supple. Skin:    General: Skin is warm and dry.  Neurological:     General: No focal deficit present.     Mental Status: Alert and oriented to person, place and time.       ASSESSMENT & PLAN:    1. Mitral valve disorder 2. S/P mitral valve replacement with metallic valve 3. Long term (current) use of anticoagulants Recent echocardiogram was stable mitral valve replacement and tricuspid valve repair.  Work-up in the hospital did not indicate hemolysis.  Continue long-term anticoagulation with Warfarin in  addition to aspirin given her prior history of stroke.  Continue SBE prophylaxis.  Follow-up in 3 months.  She will need Lovenox bridging for her upcoming EGD/colonoscopy.  4. Sinus tachycardia I suspect that this is secondary to anemia.  She is not a candidate for beta-blockers given prior history of intermittent complete heart block.  5. Macrocytic anemia Work-up ongoing.  She has EGD and colonoscopy upcoming with gastroenterology.  I will also try to get her back to see her primary care provider.  She has a poor appetite and does not seem to get enough nutrition.  I have asked her to use Ensure/Boost as well as to get a daily multivitamin.  I suspect that if her EGD and colonoscopy are normal, she will need referral to hematology.  6. Leg pain, bilateral She notes some leg discomfort.  This may all be related to her anemia.  I have asked her to follow-up with primary care to have this evaluated further.  She may need imaging on her back to rule out nerve impingement.    Dispo:  Return in about 3 months (around 04/22/2020) for Routine Follow Up w/ Dr. Marlou Porch, or Richardson Dopp, PA-C, in person.   Medication Adjustments/Labs and Tests Ordered: Current medicines are reviewed at length with the patient today.  Concerns regarding medicines are outlined above.  Tests Ordered: Orders Placed This Encounter  Procedures   CBC   Medication Changes: No orders of the defined types were placed in this encounter.   Signed, Richardson Dopp, PA-C  01/22/2020 4:07 PM    [facility] [address], [place], [place]  [address] Phone: [phone]; Fax: [phone]

## 2020-01-23 LAB — CBC
Hematocrit: 26.6 % — ABNORMAL LOW (ref 34.0–46.6)
Hemoglobin: 9.9 g/dL — ABNORMAL LOW (ref 11.1–15.9)
MCH: 40.4 pg — ABNORMAL HIGH (ref 26.6–33.0)
MCHC: 37.2 g/dL — ABNORMAL HIGH (ref 31.5–35.7)
MCV: 109 fL — ABNORMAL HIGH (ref 79–97)
Platelets: 431 10*3/uL (ref 150–450)
RBC: 2.45 x10E6/uL — CL (ref 3.77–5.28)
RDW: 17.4 % — ABNORMAL HIGH (ref 11.7–15.4)
WBC: 10.2 10*3/uL (ref 3.4–10.8)

## 2020-01-28 NOTE — Telephone Encounter (Signed)
Pt received direction from Coumadin clinic for Lovenox bridge see notes from last PT/INR at coumadin clinic.

## 2020-01-30 ENCOUNTER — Telehealth: Payer: Self-pay | Admitting: Gastroenterology

## 2020-01-30 NOTE — Telephone Encounter (Signed)
Dr. Havery Moros   This pt has cancelled her ENDO/COLON Scheduled for Monday  02-03-20  @ 3:00 pm  She stated she could not afford prep and did not have transportation. She said she would check for transportation and call back to reschedule.

## 2020-01-30 NOTE — Telephone Encounter (Signed)
ERROR

## 2020-01-30 NOTE — Telephone Encounter (Signed)
Thanks for letting me know. If she can't afford a prep we can try to get her a free sample if we have any available, or use a Miralax gatorade prep which is much more affordable. If she can get transportation arranged we can still proceed for Monday if she is willing. Thanks

## 2020-02-03 ENCOUNTER — Encounter: Payer: Medicaid Other | Admitting: Gastroenterology

## 2020-02-10 NOTE — Progress Notes (Signed)
Patient ID: BOB CHHAY, female    DOB: 01/07/1961  MRN: [id number]  CC: Burning sensation of lips  Subjective: Mary [name] is a 59 y.o. female with history of supraventricular tachycardia, cerebral vascular accident, intermittent complete heart block, acute kidney injury, and thrombocytosis who presents for burning sensation of lips.  1. BURNING SENSATION OF LIPS:  Onset: 1 month ago  Location: both lips Duration: all day  Pain: lips sometimes, gums are painful Characteristics: burning Aggravating Factors: talking, eating anything, Vaseline  Relieving Factors: nothing  Bleeding, ulcers, blisters: denies bleeding and ulcers. Reports she felt a bubbly blister in mouth on last week which eventually went away. Severity: difficulty eating and drinking because of burning sensation  Oral pain: started using antiseptic which made burning sensation worse Tongue pain: yes, 8/10, last all day Decreased taste: yes, reports can barely taste since heart surgery in 2014 Swelling: feels like lips are swollen but they aren't Trouble breathing: denies  Nausea/vomiting: nausea/vomiting every month as if she is having a menstrual cycle but this is not new onset   Patient Active Problem List   Diagnosis Date Noted  . GERD (gastroesophageal reflux disease)   . Lactic acid acidosis   . Marijuana abuse   . Macrocytic anemia 04/04/2019  . Intermittent complete heart block ([place])   . Syncope 03/28/2019  . Dehydration   . Volume depletion 05/24/2018  . Acute kidney injury ([place]) 05/23/2018  . Gastroenteritis 05/23/2018  . Hyponatremia 04/18/2017  . Orthostatic hypotension 01/11/2017  . Chronic hyponatremia 01/11/2017  . Elevated liver enzymes 01/11/2017  . Atypical chest pain 06/05/2016  . Palpitations 03/20/2016  . Sinus tachycardia 02/01/2016  . AKI (acute kidney injury) ([place]) 01/10/2016  . Subtherapeutic international normalized ratio (INR) 01/10/2016  . TIA (transient ischemic attack)  09/13/2015  . History of rheumatic heart disease 09/13/2015  . CVA (cerebral vascular accident) ([place]) 09/12/2015  . SOB (shortness of breath) 02/24/2014  . First degree heart block 06/17/2013  . Weakness 06/16/2013  . (HFpEF) heart failure with preserved ejection fraction ([place]) 06/16/2013  . Anemia 06/16/2013  . Thrombocytosis ([place]) 06/16/2013  . History of bacterial endocarditis 06/16/2013  . Heart valve replaced by other means 06/10/2013  . Long term (current) use of anticoagulants 06/10/2013  . History of complete heart block 05/28/2013  . S/P mitral valve replacement with metallic valve [id number]  . S/P minimally-invasive tricuspid valve repair 05/23/2013  . Femoral hernia 05/23/2013  . Mitral valve insufficiency 04/22/2013  . Mitral valve disorders(424.0) 04/22/2013  . Tricuspid regurgitation 04/22/2013  . 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 SVT (supraventricular tachycardia) ([place]) 03/16/2013     Current Outpatient Medications on File Prior to Visit  Medication Sig Dispense Refill  . acetaminophen (TYLENOL) 500 MG tablet Take 1,000 mg by mouth every 6 (six) hours as needed for headache.    [name] aspirin EC 81 MG tablet Take 1 tablet (81 mg total) by mouth daily.    [name] enoxaparin (LOVENOX) 60 MG/0.6ML injection Inject 0.6 mLs (60 mg total) into the skin every 12 (twelve) hours. 12 mL 0  . esomeprazole (NEXIUM) 40 MG capsule Take 30-60 minutes before breakfast 30 capsule 0  . Hyprom-Naphaz-Polysorb-Zn Sulf (CLEAR EYES COMPLETE OP) Place 2 drops into both eyes daily as needed (red eyes).    . warfarin (COUMADIN) 5 MG tablet Take as directed by Coumadin Clinic 40 tablet 1   No current facility-administered medications on file prior to visit.    Allergies  Allergen Reactions  . Oxycodone Itching    Social History   Socioeconomic History  . Marital status: Single    Spouse name: Not on file  . Number of children: Not on file  . Years of education: Not  on file  . Highest education level: Not on file  Occupational History  . Not on file  Tobacco Use  . Smoking status: Former Smoker    Packs/day: 0.50    Years: 36.00    Pack years: 18.00    Types: Cigarettes    Quit date: 03/15/2013    Years since quitting: 6.9  . Smokeless tobacco: Never Used  Substance and Sexual Activity  . Alcohol use: Yes    Comment:  "glass of wine"  . Drug use: No  . Sexual activity: Never  Other Topics Concern  . Not on file  Social History Narrative  . Not on file   Social Determinants of Health   Financial Resource Strain:   . Difficulty of Paying Living Expenses:   Food Insecurity:   . Worried About Charity fundraiser in the Last Year:   . Arboriculturist in the Last Year:   Transportation Needs:   . Film/video editor (Medical):   [name] Lack of Transportation (Non-Medical):   Physical Activity:   . Days of Exercise per Week:   . Minutes of Exercise per Session:   Stress:   . Feeling of Stress :   Social Connections:   . Frequency of Communication with Friends and Family:   . Frequency of Social Gatherings with Friends and Family:   . Attends Religious Services:   . Active Member of Clubs or Organizations:   . Attends Archivist Meetings:   [name] Marital Status:   Intimate Partner Violence:   . Fear of Current or Ex-Partner:   . Emotionally Abused:   [name] Physically Abused:   . Sexually Abused:     Family History  Problem Relation Age of Onset  . Stroke Father   . Cancer Father   . Sarcoidosis Sister   . Heart attack Neg Hx   . Colon cancer Neg Hx   . Stomach cancer Neg Hx   . Pancreatic cancer Neg Hx   . Esophageal cancer Neg Hx     Past Surgical History:  Procedure Laterality Date  . APPENDECTOMY  [id number]  . CARDIAC CATHETERIZATION    . CARDIAC VALVE REPLACEMENT    . CESAREAN SECTION  1983  . CESAREAN SECTION WITH BILATERAL TUBAL LIGATION  1999  . FEMORAL HERNIA REPAIR Right 05/23/2013   Procedure: HERNIA REPAIR  FEMORAL;  Surgeon: Rexene Alberts, MD;  Location: [facility];  Service: Open Heart Surgery;  Laterality: Right;  . INTRAOPERATIVE TRANSESOPHAGEAL ECHOCARDIOGRAM N/A 05/23/2013   Procedure: INTRAOPERATIVE TRANSESOPHAGEAL ECHOCARDIOGRAM;  Surgeon: Rexene Alberts, MD;  Location: [facility];  Service: Open Heart Surgery;  Laterality: N/A;  . LAPAROSCOPIC CHOLECYSTECTOMY  2003  . LEFT AND RIGHT HEART CATHETERIZATION WITH CORONARY ANGIOGRAM N/A 03/22/2013   Procedure: LEFT AND RIGHT HEART CATHETERIZATION WITH CORONARY ANGIOGRAM;  Surgeon: Burnell Blanks, MD;  [facility];  Service: Cardiovascular;  Laterality: N/A;  . MINIMALLY INVASIVE TRICUSPID VALVE REPAIR Right 05/23/2013   Procedure: MINIMALLY INVASIVE TRICUSPID VALVE REPAIR;  Surgeon: Rexene Alberts, MD;  Location: [facility];  Service: Open Heart Surgery;  Laterality: Right;  . MITRAL VALVE REPLACEMENT N/A 05/23/2013   Procedure: MITRAL VALVE (MV) REPLACEMENT;  Surgeon: Valentina Gu  Roxy Manns, MD;  Location: [facility];  Service: Open Heart Surgery;  Laterality: N/A;  . MULTIPLE EXTRACTIONS WITH ALVEOLOPLASTY N/A 04/04/2013   Procedure: Extraction of tooth #'s 1,8,9,13,14,15,23,24,25,26 with alveoloplasty and gross debridement of remaining teeth;  Surgeon: Lenn Cal, DDS;  Location: [facility];  Service: Oral Surgery;  Laterality: N/A;  . TEE WITHOUT CARDIOVERSION N/A 03/18/2013   Procedure: TRANSESOPHAGEAL ECHOCARDIOGRAM (TEE);  Surgeon: Larey Dresser, MD;  Location: [facility];  Service: Cardiovascular;  Laterality: N/A;  . TEE WITHOUT CARDIOVERSION N/A 06/17/2013   Procedure: TRANSESOPHAGEAL ECHOCARDIOGRAM (TEE);  Surgeon: Lelon Perla, MD;  [facility] ENDOSCOPY;  Service: Cardiovascular;  Laterality: N/A;  . TUBAL LIGATION  1999    ROS: Review of Systems Negative except as stated above  PHYSICAL EXAM: LMP 03/22/2013   Vitals with BMI 02/11/2020 01/22/2020 01/09/2020  Height - 5\' 2"  5\' 2"   Weight 117 lbs 6 oz 115 lbs 117 lbs  BMI 21.47  123XX123 [id number]  Systolic 123XX123 [id number] [id number]  Diastolic 74 64 70  Pulse [id number] 135 128  SpO2- 100%, room air  Temperature- 97 F, oral  Physical Exam General appearance - alert, well appearing, and in no distress and oriented to person, place, and time Mental status - alert, oriented to person, place, and time, normal mood, behavior, speech, dress, motor activity, and thought processes Mouth - mucous membranes moist, pharynx normal without lesions, dental hygiene poor and edentulous Neck - supple, no significant adenopathy Lymphatics - no palpable lymphadenopathy, no hepatosplenomegaly Chest - clear to auscultation, no wheezes, rales or rhonchi, symmetric air entry, no tachypnea, retractions or cyanosis Heart - normal rate, regular rhythm, normal S1, S2, no murmurs, rubs, clicks or gallops Skin - normal coloration and turgor, no rashes, no suspicious skin lesions noted  ASSESSMENT AND PLAN: 1. Burning mouth syndrome: -Will try course of Gabapentin for pain management.  -Gabapentin may affect creatinine levels. Last creatinine normal at 0.79 on 01/01/2020. -Follow-up with primary physician as needed if symptoms persist or worsen. - gabapentin (NEURONTIN) 300 MG capsule; Take 1 capsule (300 mg total) by mouth at bedtime.  Dispense: 30 capsule; Refill: 2  Patient was given the opportunity to ask questions.  Patient verbalized understanding of the plan and was able to repeat key elements of the plan. Patient was given clear instructions to go to Emergency Department or return to medical center if symptoms don't improve, worsen, or new problems develop.The patient verbalized understanding.  Camillia Herter, NP

## 2020-02-11 ENCOUNTER — Ambulatory Visit: Payer: Self-pay | Attending: Family | Admitting: Family

## 2020-02-11 ENCOUNTER — Other Ambulatory Visit: Payer: Self-pay

## 2020-02-11 VITALS — BP 108/74 | HR 112 | Temp 97.0°F | Resp 16 | Wt 117.4 lb

## 2020-02-11 DIAGNOSIS — K146 Glossodynia: Secondary | ICD-10-CM

## 2020-02-11 MED ORDER — GABAPENTIN 300 MG PO CAPS: 300.0000 mg | ORAL_CAPSULE | Freq: Every day | ORAL | 2 refills | Status: DC

## 2020-02-11 MED FILL — GABAPENTIN 300 MG CAPSULE: 300 | 30 days supply | Qty: 30 | Fill #0

## 2020-02-11 NOTE — Progress Notes (Signed)
While being discharged from clinic the [facility] came to inform me that patient was dizzy. When I reported to the room patient was sitting in the chair. Patient reports "I get dizzy all of the time."   Patient reports she has been hospitalized for dizziness on multiple occurrences. Reports she has informed her cardiologist of her dizziness and was told there is no explanation as to why she continues to get dizzy.   Currently states she is no longer dizzy and that the dizziness lasted for a brief moment. Denies change in vision, hearing, nausea, vomiting, shortness of breath, and chest pain. States she has her friend waiting in the parking lot who brought her to this appointment and who is also going to take her home.   Informed patient that she should seek immediate medical attention if dizziness persists and or becomes severe. Patient agreeable.

## 2020-02-11 NOTE — Progress Notes (Signed)
Pt states her lips are burning. Pt states she has never had this feeling before.   Pt states her gums are burning as well  Pt states she think it may be thrush because her son had it before

## 2020-02-11 NOTE — Patient Instructions (Addendum)
Gabapentin for lip pain. Follow-up with primary physician as needed.   Dental Pain Dental pain may be caused by many things, including:  Tooth decay (cavities or caries).  Infection.  The inner part of the tooth being filled with pus (abscess).  Injury. Sometimes the cause of pain is unknown. Your pain can vary. It may be mild or severe. You may have it all the time, or it may occur only when you are:  Chewing.  Exposed to hot or cold temperature.  Eating or drinking sugary foods or beverages, such as soda or candy. Follow these instructions at home: Medicines  Take over-the-counter and prescription medicines only as told by your doctor.  If you were prescribed an antibiotic medicine, take it as told by your doctor. Do not stop taking the medicine even if you start to feel better. Eating and drinking  Do not eat foods or drinks that cause you pain. These include: ? Very hot or very cold foods or drinks. ? Sweet or sugary foods or drinks. Managing pain and swelling   Gargle with a salt-water mixture 3-4 times a day. To make this, dissolve -1 tsp of salt in 1 cup of warm water.  If told, put ice on the painful area of your face: ? Put ice in a plastic bag. ? Place a towel between your skin and the bag. ? Leave the ice on for 20 minutes, 2-3 times a day. Brushing your teeth  Brush your teeth twice a day using a fluoride toothpaste.  Floss your teeth once a day.  Use a toothpaste made for sensitive teeth as told by your doctor.  Use a soft toothbrush. General instructions  Do not apply heat to the outside of your face.  Watch your dental pain. Let your doctor know if there are any changes.  Keep all follow-up visits as told by your doctor. This is important. Contact a doctor if:  Your pain is not relieved by medicines.  You have new symptoms.  Your symptoms get worse. Get help right away if:  You cannot open your mouth.  You are having trouble breathing  or swallowing.  You have a fever.  Your face, neck, or jaw is swollen. Summary  Dental pain may be caused by many things, including tooth decay, injury, or infection. In some cases, the cause is not known.  Your pain may be mild or severe. You may have pain all the time, or you may have it only when you eat or drink.  Take over-the-counter and prescription medicines only as told by your doctor.  Watch your dental pain for any changes. Let your doctor know if symptoms get worse. This information is not intended to replace advice given to you by your health care provider. Make sure you discuss any questions you have with your health care provider. Document Revised: 01/08/2019 Document Reviewed: 09/25/2017 Elsevier Patient Education  2020 [employer].

## 2020-02-14 ENCOUNTER — Ambulatory Visit: Payer: Medicaid Other

## 2020-03-05 ENCOUNTER — Ambulatory Visit (INDEPENDENT_AMBULATORY_CARE_PROVIDER_SITE_OTHER): Payer: Self-pay | Admitting: *Deleted

## 2020-03-05 ENCOUNTER — Other Ambulatory Visit: Payer: Self-pay

## 2020-03-05 DIAGNOSIS — Z7901 Long term (current) use of anticoagulants: Secondary | ICD-10-CM

## 2020-03-05 LAB — POCT INR: INR: 3.4 — AB (ref 2.0–3.0)

## 2020-03-05 NOTE — Patient Instructions (Signed)
Description   Continue taking 1 tablet everyday except 1.5 tablets on Mondays, Wednesdays and Fridays. Recheck INR on 4 weeks. Continue eating the same amount of leafy green vegetables.  Coumadin Clinic # [phone]

## 2020-03-16 ENCOUNTER — Other Ambulatory Visit: Payer: Self-pay | Admitting: Pharmacist

## 2020-03-16 DIAGNOSIS — Z7901 Long term (current) use of anticoagulants: Secondary | ICD-10-CM

## 2020-03-16 MED ORDER — WARFARIN SODIUM 5 MG PO TABS: ORAL_TABLET | ORAL | 1 refills | Status: DC

## 2020-03-16 MED FILL — GABAPENTIN 300 MG CAPSULE: 300 | 30 days supply | Qty: 30 | Fill #1

## 2020-03-16 NOTE — Telephone Encounter (Signed)
Patient called requesting warfarin refill.  INR current, next INR 7/9.  Refill sent

## 2020-04-01 ENCOUNTER — Telehealth: Payer: Self-pay | Admitting: Licensed Clinical Social Worker

## 2020-04-01 NOTE — Telephone Encounter (Signed)
CSW received call from pt requesting help with transport to appt tomorrow morning at coumadin clinic.  CSW sent in request to [facility] and made sure she has the phone number to contact [facility] when her appt is done.  Will continue to follow and assist as needed  Jorge Ny, [facility] Desk#: [phone] Cell#: [phone]

## 2020-04-07 ENCOUNTER — Telehealth: Payer: Self-pay | Admitting: Family Medicine

## 2020-04-07 DIAGNOSIS — K146 Glossodynia: Secondary | ICD-10-CM

## 2020-04-07 NOTE — Telephone Encounter (Signed)
Medication Refill - Medication: gabapentin (NEURONTIN) 300 MG capsule    Preferred Pharmacy (with phone number or street name):  [place], [address] Phone:  [phone]  Fax:  [phone]       Agent: Please be advised that RX refills may take up to 3 business days. We ask that you follow-up with your pharmacy.

## 2020-04-13 MED FILL — GABAPENTIN 300 MG CAPSULE: 300 | 30 days supply | Qty: 30 | Fill #2

## 2020-04-13 NOTE — Telephone Encounter (Signed)
Not our patient

## 2020-04-13 NOTE — Telephone Encounter (Signed)
Patient checking on the status of medication refill request mentioned below, patient would like a follow up call today best # [phone]

## 2020-04-13 NOTE — Telephone Encounter (Signed)
Patient is calling to check status of her RF- advised she should have RF on file at pharmacy. Patient has not contacted them to check- she asked to be transferred to them- call to pharmacy and patient has been connected.

## 2020-04-22 ENCOUNTER — Telehealth: Payer: Self-pay | Admitting: Licensed Clinical Social Worker

## 2020-04-22 NOTE — Telephone Encounter (Signed)
Pt called CSW to request help with ride to MD appt tomorrow.  CSW able to set up transport through [employer]- anticipated pick up at 9am tomorrow- pt informed  Will continue to follow and assist as needed  Jorge Ny, [facility] Desk#: [phone] Cell#: [phone]

## 2020-04-23 ENCOUNTER — Observation Stay (HOSPITAL_COMMUNITY): Payer: Self-pay

## 2020-04-23 ENCOUNTER — Other Ambulatory Visit: Payer: Self-pay

## 2020-04-23 ENCOUNTER — Encounter: Payer: Self-pay | Admitting: Cardiology

## 2020-04-23 ENCOUNTER — Ambulatory Visit (INDEPENDENT_AMBULATORY_CARE_PROVIDER_SITE_OTHER): Payer: Self-pay | Admitting: Cardiology

## 2020-04-23 ENCOUNTER — Inpatient Hospital Stay (HOSPITAL_COMMUNITY)
Admission: EM | Admit: 2020-04-23 | Discharge: 2020-05-04 | DRG: 391 | Disposition: A | Discharge status: Home Health | Payer: Self-pay | Attending: Internal Medicine | Admitting: Internal Medicine

## 2020-04-23 VITALS — BP 84/60 | HR 115 | Ht 62.0 in | Wt 110.0 lb

## 2020-04-23 DIAGNOSIS — D509 Iron deficiency anemia, unspecified: Secondary | ICD-10-CM | POA: Diagnosis present

## 2020-04-23 DIAGNOSIS — Z9049 Acquired absence of other specified parts of digestive tract: Secondary | ICD-10-CM

## 2020-04-23 DIAGNOSIS — E538 Deficiency of other specified B group vitamins: Secondary | ICD-10-CM | POA: Diagnosis present

## 2020-04-23 DIAGNOSIS — Z8679 Personal history of other diseases of the circulatory system: Secondary | ICD-10-CM

## 2020-04-23 DIAGNOSIS — K2900 Acute gastritis without bleeding: Principal | ICD-10-CM | POA: Diagnosis present

## 2020-04-23 DIAGNOSIS — X58XXXA Exposure to other specified factors, initial encounter: Secondary | ICD-10-CM | POA: Diagnosis present

## 2020-04-23 DIAGNOSIS — E871 Hypo-osmolality and hyponatremia: Secondary | ICD-10-CM | POA: Diagnosis present

## 2020-04-23 DIAGNOSIS — Z885 Allergy status to narcotic agent status: Secondary | ICD-10-CM

## 2020-04-23 DIAGNOSIS — Z954 Presence of other heart-valve replacement: Secondary | ICD-10-CM

## 2020-04-23 DIAGNOSIS — Z87891 Personal history of nicotine dependence: Secondary | ICD-10-CM

## 2020-04-23 DIAGNOSIS — Z79899 Other long term (current) drug therapy: Secondary | ICD-10-CM

## 2020-04-23 DIAGNOSIS — Z20822 Contact with and (suspected) exposure to covid-19: Secondary | ICD-10-CM | POA: Diagnosis not present

## 2020-04-23 DIAGNOSIS — R197 Diarrhea, unspecified: Secondary | ICD-10-CM | POA: Diagnosis present

## 2020-04-23 DIAGNOSIS — R627 Adult failure to thrive: Secondary | ICD-10-CM

## 2020-04-23 DIAGNOSIS — I6523 Occlusion and stenosis of bilateral carotid arteries: Secondary | ICD-10-CM | POA: Diagnosis present

## 2020-04-23 DIAGNOSIS — K311 Adult hypertrophic pyloric stenosis: Secondary | ICD-10-CM | POA: Diagnosis present

## 2020-04-23 DIAGNOSIS — E876 Hypokalemia: Secondary | ICD-10-CM | POA: Diagnosis present

## 2020-04-23 DIAGNOSIS — R112 Nausea with vomiting, unspecified: Secondary | ICD-10-CM | POA: Diagnosis present

## 2020-04-23 DIAGNOSIS — R55 Syncope and collapse: Secondary | ICD-10-CM

## 2020-04-23 DIAGNOSIS — Z7901 Long term (current) use of anticoagulants: Secondary | ICD-10-CM

## 2020-04-23 DIAGNOSIS — I5032 Chronic diastolic (congestive) heart failure: Secondary | ICD-10-CM | POA: Diagnosis present

## 2020-04-23 DIAGNOSIS — N170 Acute kidney failure with tubular necrosis: Secondary | ICD-10-CM | POA: Diagnosis present

## 2020-04-23 DIAGNOSIS — D689 Coagulation defect, unspecified: Secondary | ICD-10-CM | POA: Diagnosis not present

## 2020-04-23 DIAGNOSIS — E860 Dehydration: Secondary | ICD-10-CM | POA: Diagnosis present

## 2020-04-23 DIAGNOSIS — K219 Gastro-esophageal reflux disease without esophagitis: Secondary | ICD-10-CM | POA: Diagnosis present

## 2020-04-23 DIAGNOSIS — Z7982 Long term (current) use of aspirin: Secondary | ICD-10-CM

## 2020-04-23 DIAGNOSIS — I2510 Atherosclerotic heart disease of native coronary artery without angina pectoris: Secondary | ICD-10-CM

## 2020-04-23 DIAGNOSIS — E861 Hypovolemia: Secondary | ICD-10-CM | POA: Diagnosis present

## 2020-04-23 DIAGNOSIS — K449 Diaphragmatic hernia without obstruction or gangrene: Secondary | ICD-10-CM | POA: Diagnosis present

## 2020-04-23 DIAGNOSIS — Z8673 Personal history of transient ischemic attack (TIA), and cerebral infarction without residual deficits: Secondary | ICD-10-CM

## 2020-04-23 DIAGNOSIS — Z952 Presence of prosthetic heart valve: Secondary | ICD-10-CM

## 2020-04-23 DIAGNOSIS — D539 Nutritional anemia, unspecified: Secondary | ICD-10-CM | POA: Diagnosis present

## 2020-04-23 DIAGNOSIS — D62 Acute posthemorrhagic anemia: Secondary | ICD-10-CM | POA: Diagnosis present

## 2020-04-23 DIAGNOSIS — K529 Noninfective gastroenteritis and colitis, unspecified: Secondary | ICD-10-CM | POA: Diagnosis present

## 2020-04-23 DIAGNOSIS — Z823 Family history of stroke: Secondary | ICD-10-CM

## 2020-04-23 DIAGNOSIS — R1110 Vomiting, unspecified: Secondary | ICD-10-CM | POA: Diagnosis present

## 2020-04-23 DIAGNOSIS — T7029XA Other effects of high altitude, initial encounter: Secondary | ICD-10-CM | POA: Diagnosis present

## 2020-04-23 DIAGNOSIS — I110 Hypertensive heart disease with heart failure: Secondary | ICD-10-CM | POA: Diagnosis present

## 2020-04-23 LAB — CBC
HCT: 23.8 % — ABNORMAL LOW (ref 36.0–46.0)
Hemoglobin: 8.9 g/dL — ABNORMAL LOW (ref 12.0–15.0)
MCH: 44.9 pg — ABNORMAL HIGH (ref 26.0–34.0)
MCHC: 37.4 g/dL — ABNORMAL HIGH (ref 30.0–36.0)
MCV: 120.2 fL — ABNORMAL HIGH (ref 80.0–100.0)
Platelets: 337 10*3/uL (ref 150–400)
RBC: 1.98 MIL/uL — ABNORMAL LOW (ref 3.87–5.11)
RDW: 14.2 % (ref 11.5–15.5)
WBC: 7.2 10*3/uL (ref 4.0–10.5)
nRBC: 0 % (ref 0.0–0.2)

## 2020-04-23 LAB — LIPASE, BLOOD: Lipase: 15 U/L (ref 11–51)

## 2020-04-23 LAB — PROTIME-INR
INR: 2 — ABNORMAL HIGH (ref 0.8–1.2)
Prothrombin Time: 22 seconds — ABNORMAL HIGH (ref 11.4–15.2)

## 2020-04-23 LAB — COMPREHENSIVE METABOLIC PANEL
ALT: 23 U/L (ref 0–44)
AST: 45 U/L — ABNORMAL HIGH (ref 15–41)
Albumin: 3 g/dL — ABNORMAL LOW (ref 3.5–5.0)
Alkaline Phosphatase: 138 U/L — ABNORMAL HIGH (ref 38–126)
Anion gap: 15 (ref 5–15)
BUN: 9 mg/dL (ref 6–20)
CO2: 24 mmol/L (ref 22–32)
Calcium: 8.6 mg/dL — ABNORMAL LOW (ref 8.9–10.3)
Chloride: 88 mmol/L — ABNORMAL LOW (ref 98–111)
Creatinine, Ser: 1.17 mg/dL — ABNORMAL HIGH (ref 0.44–1.00)
GFR calc Af Amer: 60 mL/min — ABNORMAL LOW (ref >60)
GFR calc non Af Amer: 52 mL/min — ABNORMAL LOW (ref >60)
Glucose, Bld: 80 mg/dL (ref 70–99)
Potassium: 3.1 mmol/L — ABNORMAL LOW (ref 3.5–5.1)
Sodium: 127 mmol/L — ABNORMAL LOW (ref 135–145)
Total Bilirubin: 0.9 mg/dL (ref 0.3–1.2)
Total Protein: 6.2 g/dL — ABNORMAL LOW (ref 6.5–8.1)

## 2020-04-23 LAB — SARS CORONAVIRUS 2 BY RT PCR (HOSPITAL ORDER, PERFORMED IN ~~LOC~~ HOSPITAL LAB): SARS Coronavirus 2: NEGATIVE

## 2020-04-23 MED ORDER — ACETAMINOPHEN 325 MG PO TABS
650.0000 mg | ORAL_TABLET | Freq: Four times a day (QID) | ORAL | Status: DC | PRN
  Administered 2020-04-25 – 2020-05-03 (×10): 650 mg via ORAL
  Filled 2020-04-23 (×10): qty 2

## 2020-04-23 MED ORDER — ACETAMINOPHEN 650 MG RE SUPP: 650.0000 mg | Freq: Four times a day (QID) | RECTAL | Status: DC | PRN

## 2020-04-23 MED ORDER — HEPARIN (PORCINE) 25000 UT/250ML-% IV SOLN
800.0000 [IU]/h | INTRAVENOUS | Status: AC
  Administered 2020-04-24 – 2020-04-25 (×3): 800 [IU]/h via INTRAVENOUS
  Filled 2020-04-23 (×2): qty 250

## 2020-04-23 MED ORDER — ONDANSETRON HCL 4 MG/2ML IJ SOLN
4.0000 mg | Freq: Four times a day (QID) | INTRAMUSCULAR | Status: DC | PRN
  Administered 2020-04-24 – 2020-05-01 (×2): 4 mg via INTRAVENOUS
  Filled 2020-04-23 (×3): qty 2

## 2020-04-23 MED ORDER — SODIUM CHLORIDE 0.9 % IV SOLN: INTRAVENOUS | Status: AC

## 2020-04-23 MED ORDER — IOHEXOL 300 MG/ML  SOLN
80.0000 mL | Freq: Once | INTRAMUSCULAR | Status: AC | PRN
  Administered 2020-04-23: 80 mL via INTRAVENOUS

## 2020-04-23 MED ORDER — ONDANSETRON HCL 4 MG PO TABS: 4.0000 mg | ORAL_TABLET | Freq: Four times a day (QID) | ORAL | Status: DC | PRN

## 2020-04-23 MED ORDER — SODIUM CHLORIDE 0.9 % IV BOLUS
500.0000 mL | Freq: Once | INTRAVENOUS | Status: AC
  Administered 2020-04-23: 500 mL via INTRAVENOUS

## 2020-04-23 MED ORDER — ONDANSETRON HCL 4 MG/2ML IJ SOLN
4.0000 mg | Freq: Once | INTRAMUSCULAR | Status: AC
  Administered 2020-04-23: 4 mg via INTRAVENOUS
  Filled 2020-04-23: qty 2

## 2020-04-23 MED ORDER — POTASSIUM CHLORIDE 10 MEQ/100ML IV SOLN
10.0000 meq | INTRAVENOUS | Status: AC
  Administered 2020-04-23 – 2020-04-24 (×4): 10 meq via INTRAVENOUS
  Filled 2020-04-23 (×4): qty 100

## 2020-04-23 MED ORDER — GABAPENTIN 300 MG PO CAPS
300.0000 mg | ORAL_CAPSULE | Freq: Every day | ORAL | Status: DC
  Administered 2020-04-23 – 2020-05-03 (×11): 300 mg via ORAL
  Filled 2020-04-23 (×11): qty 1

## 2020-04-23 MED ORDER — PANTOPRAZOLE SODIUM 40 MG IV SOLR
40.0000 mg | INTRAVENOUS | Status: DC
  Administered 2020-04-23 – 2020-04-27 (×5): 40 mg via INTRAVENOUS
  Filled 2020-04-23 (×5): qty 40

## 2020-04-23 MED ORDER — POTASSIUM CHLORIDE CRYS ER 20 MEQ PO TBCR
40.0000 meq | EXTENDED_RELEASE_TABLET | Freq: Once | ORAL | Status: AC
  Administered 2020-04-23: 40 meq via ORAL
  Filled 2020-04-23: qty 2

## 2020-04-23 NOTE — ED Triage Notes (Signed)
Pt here via EMS from PCP office for eval of n/v/d and generalized weakness x 4 days. Denies abdominal pain.

## 2020-04-23 NOTE — Patient Instructions (Signed)
You are being sent to the ED of [facility] for further evaluation and treatment.  Medication Instructions:  The current medical regimen is effective;  continue present plan and medications.  *If you need a refill on your cardiac medications before your next appointment, please call your pharmacy*  Follow-Up: At [facility] you and your health needs are our priority.  As part of our continuing mission to provide you with exceptional heart care, we have created designated Provider Care Teams.  These Care Teams include your primary Cardiologist (physician) and Advanced Practice Providers (APPs -  Physician Assistants and Nurse Practitioners) who all work together to provide you with the care you need, when you need it.  We recommend signing up for the patient portal called "MyChart".  Sign up information is provided on this After Visit Summary.  MyChart is used to connect with patients for Virtual Visits (Telemedicine).  Patients are able to view lab/test results, encounter notes, upcoming appointments, etc.  Non-urgent messages can be sent to your provider as well.   To learn more about what you can do with MyChart, go to [email or web address].    Follow up as instructed after hospital discharge.  Thank you for choosing [facility]!!

## 2020-04-23 NOTE — H&P (Addendum)
[facility] History and Physical  Mary [name] QPY:[id number] DOB: 04/05/1961 DOA: 04/23/2020   PCP: Cain Saupe, MD  Specialists: Followed by Dr. Anne Fu with cardiology  Chief Complaint: Nausea vomiting and diarrhea ongoing for a few days  HPI: Mary [name] is a 59 y.o. female with a past medical history of mitral valve replacement on warfarin, history of macrocytic anemia, history of GERD who was in her usual state of health till about 4 to 5 days ago when she started developing nausea followed by onset of vomiting and diarrhea.  She denies any blood in the emesis or in the stool.  She has had profuse amount of watery stool.  Denies any recent antibiotic use.  Denies eating out anywhere recently.  She does have some abdominal discomfort associated with the symptoms.  Denies any fever or chills.  Denies any dysuria.  She does admit to acid reflux symptoms.  She had similar presentation back in April when she had to be hospitalized for dehydration.  She has never had upper endoscopy or colonoscopy.  It appears that these procedures were planned in May however patient could not find transportation and could not afford the medications needed for prep.  In the emergency department blood work revealed hyponatremia.  She was also noted to be hypokalemic.  She was noted to be dehydrated clinically.  Creatinine was noted to be slightly higher than her baseline.  Hemoglobin was close to her baseline.  She was given oral challenge however patient could not tolerate and had another episode of vomiting.  She will be hospitalized for further management.  Home Medications: Prior to Admission medications   Medication Sig Start Date End Date Taking? Authorizing Provider  acetaminophen (TYLENOL) 500 MG tablet Take 1,000 mg by mouth every 6 (six) hours as needed for headache.    [provider]  aspirin EC 81 MG tablet Take 1 tablet (81 mg total) by mouth daily. 08/26/13   Laurey Morale,  MD  enoxaparin (LOVENOX) 60 MG/0.6ML injection Inject 0.6 mLs (60 mg total) into the skin every 12 (twelve) hours. 01/22/20   Jake Bathe, MD  esomeprazole (NEXIUM) 40 MG capsule Take 30-60 minutes before breakfast 01/09/20   Unk Lightning, PA  gabapentin (NEURONTIN) 300 MG capsule Take 1 capsule (300 mg total) by mouth at bedtime. 02/11/20   Rema Fendt, NP  Hyprom-Naphaz-Polysorb-Zn Sulf (CLEAR EYES COMPLETE OP) Place 2 drops into both eyes daily as needed (red eyes).    [provider]  warfarin (COUMADIN) 5 MG tablet Take 1.5 tablets Mon, Wed, Fri and 1 tablet all other days of the week or as directed by Coumadin clinic. 03/16/20   Jake Bathe, MD    Allergies:  Allergies  Allergen Reactions  . Oxycodone Itching    Past Medical History: Past Medical History:  Diagnosis Date  . Acute diastolic heart failure (HCC) 03/15/2013  . Anemia   . B12 deficiency   . Carotid stenosis    Carotid US 2/17: bilateral ICA 1-39% >> FU prn  . Childhood asthma   . Complete heart block (HCC)   . Epigastric hernia 200's  . GERD (gastroesophageal reflux disease)   . Heart murmur   . History of blood transfusion    "14 w/1st pregnancy; 2 w/last C-section" (03/15/2013)  . Hx of echocardiogram    a. Echo 4/16:  Mild focal basal septal hypertrophy, EF 60-65%, no RWMA, Mechanical MVR ok with mild central regurgitation and no perivalvular  leak, small mobile density attached to valvular ring (1.5x1 cm) - post surgical changes vs SBE, mild LAE;   b. Echo 2/17:  Mild post wall LVH, EF 55-60%, no RWMA, mechanical MVR ok (mean 5 mmHg), normal RVSF  . Hypertension    no pcp   will go to [facility]  . Macrocytic anemia   . 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   33mm Sorin [employer]omed[employer] mechanical prosthesis via right mini thoracotomy approach  . 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 Severe mitral regurgitation 04/22/2013  . Sinus tachycardia   . SVT (supraventricular tachycardia) (HCC) 03/16/2013  . TIA (transient ischemic attack)   . Tricuspid regurgitation     Past Surgical History:  Procedure Laterality Date  . APPENDECTOMY  [id number]  . CARDIAC CATHETERIZATION    . CARDIAC VALVE REPLACEMENT    . CESAREAN SECTION  1983  . CESAREAN SECTION WITH BILATERAL TUBAL LIGATION  1999  . FEMORAL HERNIA REPAIR Right 05/23/2013   Procedure: HERNIA REPAIR FEMORAL;  Surgeon: Purcell Nails, MD;  Location[facility] OR;  Service: Open Heart Surgery;  Laterality: Right;  . INTRAOPERATIVE TRANSESOPHAGEAL ECHOCARDIOGRAM N/A 05/23/2013   Procedure: INTRAOPERATIVE TRANSESOPHAGEAL ECHOCARDIOGRAM;  Surgeon: Purcell Nails, MD;  Location[facility] OR;  Service: Open Heart Surgery;  Laterality: N/A;  . LAPAROSCOPIC CHOLECYSTECTOMY  2003  . LEFT AND RIGHT HEART CATHETERIZATION WITH CORONARY ANGIOGRAM N/A 03/22/2013   Procedure: LEFT AND RIGHT HEART CATHETERIZATION WITH CORONARY ANGIOGRAM;  Surgeon: Kathleene Hazel, MD;  Location: [facility] CATH LAB;  Service: Cardiovascular;  Laterality: N/A;  . MINIMALLY INVASIVE TRICUSPID VALVE REPAIR Right 05/23/2013   Procedure: MINIMALLY INVASIVE TRICUSPID VALVE REPAIR;  Surgeon: Purcell Nails, MD;  Location: [facility] OR;  Service: Open Heart Surgery;  Laterality: Right;  . MITRAL VALVE REPLACEMENT N/A 05/23/2013   Procedure: MITRAL VALVE (MV) REPLACEMENT;  Surgeon: Purcell Nails, MD;  Location: [facility] OR;  Service: Open Heart Surgery;  Laterality: N/A;  . MULTIPLE EXTRACTIONS WITH ALVEOLOPLASTY N/A 04/04/2013   Procedure: Extraction of tooth #'s 1,8,9,13,14,15,23,24,25,26 with alveoloplasty and gross debridement of remaining teeth;  Surgeon: Charlynne Pander, DDS;  Location: [facility] OR;  Service: Oral Surgery;  Laterality: N/A;  . TEE  WITHOUT CARDIOVERSION N/A 03/18/2013   Procedure: TRANSESOPHAGEAL ECHOCARDIOGRAM (TEE);  Surgeon: Laurey Morale, MD;  Location: [facility] ENDOSCOPY;  Service: Cardiovascular;  Laterality: N/A;  . TEE WITHOUT CARDIOVERSION N/A 06/17/2013   Procedure: TRANSESOPHAGEAL ECHOCARDIOGRAM (TEE);  Surgeon: Lewayne Bunting, MD;  Location: [facility] ENDOSCOPY;  Service: Cardiovascular;  Laterality: N/A;  . TUBAL LIGATION  1999    Social History: She lives by herself.  Former smoker quit in 2014.  Occasionally she has wine but not on a daily basis.  Has history of using marijuana previously.  Denies any other illicit drug use.  Family History:  Family History  Problem Relation Age of Onset  . Stroke Father   . Cancer Father   . Sarcoidosis Sister   . Heart attack Neg Hx   . Colon cancer Neg Hx   . Stomach cancer Neg Hx   . Pancreatic cancer Neg Hx   . Esophageal cancer Neg Hx      Review of Systems - History obtained from the patient General ROS: positive for  - fatigue Psychological ROS:  negative Ophthalmic ROS: negative ENT ROS: negative Allergy and Immunology ROS: negative Hematological and Lymphatic ROS: negative Endocrine ROS: negative Respiratory ROS: no cough, shortness of breath, or wheezing Cardiovascular ROS: no chest pain or dyspnea on exertion Gastrointestinal ROS: As in HPI Genito-Urinary ROS: no dysuria, trouble voiding, or hematuria Musculoskeletal ROS: negative Neurological ROS: no TIA or stroke symptoms Dermatological ROS: negative  Physical Examination  Vitals:   04/23/20 1108 04/23/20 1300 04/23/20 1804 04/23/20 1900  BP: 122/68 104/71 91/65 107/69  Pulse: 96 (!) 121 (!) 110 (!) 106  Resp: 16 15 18 21   Temp: 97.8 F (36.6 C)     TempSrc: Oral     SpO2:  100% 100% 100%    BP 107/69   Pulse (!) 106   Temp 97.8 F (36.6 C) (Oral)   Resp 21   LMP 03/22/2013   SpO2 100%   General appearance: alert, cooperative, appears stated age and no distress Head: Normocephalic,  without obvious abnormality, atraumatic Eyes: conjunctivae/corneas clear. PERRL, EOM's intact.  Throat: Dry mucous membranes.  No oral lesions noted. Neck: no adenopathy, no carotid bruit, no JVD, supple, symmetrical, trachea midline and thyroid not enlarged, symmetric, no tenderness/mass/nodules Resp: clear to auscultation bilaterally Cardio: S1-S2 is tachycardic regular.  No S3-S4.  Mechanical heart sound is heard. GI: Abdomen is soft.  Tender diffusely without any rebound rigidity or guarding.  No masses organomegaly.  Bowel sounds sluggish but present. Extremities: extremities normal, atraumatic, no cyanosis or edema Pulses: 2+ and symmetric Skin: Skin color, texture, turgor normal. No rashes or lesions Lymph nodes: Cervical, supraclavicular, and axillary nodes normal. Neurologic: Alert and oriented x3.  Cranial nerves II to XII intact.  Motor strength equal bilateral upper and lower extremities.    Labs on Admission: I have personally reviewed following labs and imaging studies  CBC: Recent Labs  Lab 04/23/20 1219  WBC 7.2  HGB 8.9*  HCT 23.8*  MCV 120.2*  PLT 337   Basic Metabolic Panel: Recent Labs  Lab 04/23/20 1219  NA 127*  K 3.1*  CL 88*  CO2 24  GLUCOSE 80  BUN 9  CREATININE 1.17*  CALCIUM 8.6*   GFR: Estimated Creatinine Clearance: 41.8 mL/min (A) (by C-G formula based on SCr of 1.17 mg/dL (H)). Liver Function Tests: Recent Labs  Lab 04/23/20 1219  AST 45*  ALT 23  ALKPHOS 138*  BILITOT 0.9  PROT 6.2*  ALBUMIN 3.0*   Recent Labs  Lab 04/23/20 1219  LIPASE 15     Radiological Exams on Admission: No results found.    Problem List  Principal Problem:   Acute gastroenteritis Active Problems:   Warfarin anticoagulation   History of rheumatic heart disease   Hyponatremia   Dehydration   Macrocytic anemia   Assessment: This is a 59 year old female with past medical history as stated earlier who presents with nausea vomiting and  diarrhea ongoing for about 4 to 5 days.  Abdominal tenderness is appreciated.  Etiology for symptoms not entirely clear.  Could be just viral gastroenteritis.  She denies any recent antibiotic use.  Her WBC is noted to be normal.    Plan:  1. Acute gastroenteritis: Abdomen is tender to palpation.  Proceed with CT scan of the abdomen pelvis.  Proceed with stool studies.  She denies any blood in the emesis or in the stool.  She will be aggressively hydrated and treated symptomatically for now.  Hold off on antibiotics for now.  UA is pending.  Lipase  was normal.  LFTs not significantly elevated.  She is status post cholecystectomy previously. PPI.  CT scan suggests esophagitis. May not be unreasonable to get GI input to see if endoscopies can be done while she is in the hospital. Will consult [facility] who has seen her previously.  2.  Hyponatremia and hypokalemia/dehydration: This is in the setting of GI loss.  She will be hydrated.  Potassium will be repleted.  Check magnesium levels.  3.  History of rheumatic heart disease with history of mitral valve replacement with mechanical valve: Followed by Dr. Anne Fu with cardiology.  She is on warfarin for anticoagulation.  INR is pending.  INR is 2.0. Will start IV Heparin for now.  #4 macrocytic anemia: Check anemia panel in the morning.  Hemoglobin seems to be close to her baseline.  As mentioned above there has been no evidence for any overt bleeding.  She has been seen by gastroenterology previously but due to social issues has not been able to undergo colonoscopy or upper endoscopy so far.  DVT Prophylaxis: On warfarin chronically.  INR is pending. Code Status: Full code Family Communication: Discussed with the patient Disposition: Hopefully return home when improved Consults called: None Admission Status: Status is: Observation  The patient remains OBS appropriate and will d/c before 2 midnights.  Dispo: The patient is from: Home               Anticipated d/c is to: Home              Anticipated d/c date is: 1 day              Patient currently is not medically stable to d/c.     Severity of Illness: The appropriate patient status for this patient is OBSERVATION. Observation status is judged to be reasonable and necessary in order to provide the required intensity of service to ensure the patient's safety. The patient's presenting symptoms, physical exam findings, and initial radiographic and laboratory data in the context of their medical condition is felt to place them at decreased risk for further clinical deterioration. Furthermore, it is anticipated that the patient will be medically stable for discharge from the hospital within 2 midnights of admission. The following factors support the patient status of observation.   " The patient's presenting symptoms include nausea vomiting and diarrhea. " The physical exam findings include abdominal tenderness. " The initial radiographic and laboratory data are concerning for hypovolemia.   Further management decisions will depend on results of further testing and patient's response to treatment.   Jaidyn Kuhl [facility]  [facility] [employer] on [email or web address]  04/23/2020, 10:04 PM

## 2020-04-23 NOTE — ED Notes (Signed)
RN administered pt her PO Potassium, pt immediately after started vomiting water. Dr Ralene Bathe informed.

## 2020-04-23 NOTE — ED Provider Notes (Signed)
[facility] Provider Note   CSN: [id number] Arrival date & time: 04/23/20  1102     History Chief Complaint  Patient presents with  . Weakness    Mary [name] is a 59 y.o. female.  The history is provided by the patient and medical records. No language interpreter was used.  Weakness  Mary [name] is a 59 y.o. female who presents to the Emergency Department complaining of weakness. She presents the emergency department for evaluation of weakness, vomiting and diarrhea. She began to feel poorly four days ago with about two episodes of nonbloody emesis daily and numerous episodes of diarrhea that she described as yellow. She went to see her cardiologist today and felt very lightheaded and as if she might pass out when walking down the stairs to her cab. She was referred to the emergency department from cardiology office. She denies any fever. She does have shortness of breath and cough, which is chronic and at her baseline. She denies any abdominal pain, leg swelling or pain. She does have a history of mitral valve replacement and is anticoagulated.    Past Medical History:  Diagnosis Date  . Acute diastolic heart failure ([place]) 03/15/2013  . Anemia   . B12 deficiency   . Carotid stenosis    Carotid US 2/17: bilateral ICA 1-39% >> FU prn  . Childhood asthma   . Complete heart block ([place])   . Epigastric hernia 200's  . GERD (gastroesophageal reflux disease)   . Heart murmur   . History of blood transfusion    "14 w/1st pregnancy; 2 w/last C-section" (03/15/2013)  . Hx of echocardiogram    a. Echo 4/16:  Mild focal basal septal hypertrophy, EF 60-65%, no RWMA, Mechanical MVR ok with mild central regurgitation and no perivalvular leak, small mobile density attached to valvular ring (1.5x1 cm) - post surgical changes vs SBE, mild LAE;   b. Echo 2/17:  Mild post wall LVH, EF 55-60%, no RWMA, mechanical MVR ok (mean 5 mmHg), normal RVSF  .  Hypertension    no pcp   will go to [facility]  . Macrocytic anemia   . 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 S/P mitral valve replacement with metallic valve 01/20/622   66mm Sorin Carbomedics mechanical prosthesis via right mini thoracotomy approach  . 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   Complex valvuloplasty including Cor-matrix ECM patch augmentation of anterior and lateral leaflets with 77mm Edwards mc3 ring annuloplasty via right mini-thoracotomy approach  . Severe mitral regurgitation 04/22/2013  . Sinus tachycardia   . SVT (supraventricular tachycardia) ([place]) 03/16/2013  . TIA (transient ischemic attack)   . Tricuspid regurgitation     Patient Active Problem List   Diagnosis Date Noted  . GERD (gastroesophageal reflux disease)   . Lactic acid acidosis   . Marijuana abuse   . Macrocytic anemia 04/04/2019  . Intermittent complete heart block ([place])   . Syncope 03/28/2019  . Dehydration   . Volume depletion 05/24/2018  . Acute kidney injury ([place]) 05/23/2018  . Acute gastroenteritis 05/23/2018  . Hyponatremia 04/18/2017  . Orthostatic hypotension 01/11/2017  . Chronic hyponatremia 01/11/2017  . Elevated liver enzymes 01/11/2017  . Atypical chest pain 06/05/2016  . Palpitations 03/20/2016  . Sinus tachycardia 02/01/2016  . AKI (acute kidney injury) ([place]) 01/10/2016  . Subtherapeutic international normalized ratio (INR) 01/10/2016  . TIA (transient ischemic attack) 09/13/2015  . History of rheumatic heart disease 09/13/2015  . CVA (  cerebral vascular accident) ([place]) 09/12/2015  . SOB (shortness of breath) 02/24/2014  . First degree heart block 06/17/2013  . Weakness 06/16/2013  . (HFpEF) heart failure with preserved ejection fraction ([place]) 06/16/2013  . Anemia 06/16/2013  . Thrombocytosis ([place]) 06/16/2013  . History of bacterial endocarditis 06/16/2013  . Heart valve replaced by other means 06/10/2013  . Warfarin anticoagulation 06/10/2013  . History of  complete heart block 05/28/2013  . S/P mitral valve replacement with metallic valve 64/40/3474  . S/P minimally-invasive tricuspid valve repair 05/23/2013  . Femoral hernia 05/23/2013  . Mitral valve insufficiency 04/22/2013  . Mitral valve disorders(424.0) 04/22/2013  . Tricuspid regurgitation 04/22/2013  . 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 SVT (supraventricular tachycardia) ([place]) 03/16/2013    Past Surgical History:  Procedure Laterality Date  . APPENDECTOMY  [id number]  . CARDIAC CATHETERIZATION    . CARDIAC VALVE REPLACEMENT    . CESAREAN SECTION  1983  . CESAREAN SECTION WITH BILATERAL TUBAL LIGATION  1999  . FEMORAL HERNIA REPAIR Right 05/23/2013   Procedure: HERNIA REPAIR FEMORAL;  Surgeon: Rexene Alberts, MD;  Location: [facility];  Service: Open Heart Surgery;  Laterality: Right;  . INTRAOPERATIVE TRANSESOPHAGEAL ECHOCARDIOGRAM N/A 05/23/2013   Procedure: INTRAOPERATIVE TRANSESOPHAGEAL ECHOCARDIOGRAM;  Surgeon: Rexene Alberts, MD;  Location: [facility];  Service: Open Heart Surgery;  Laterality: N/A;  . LAPAROSCOPIC CHOLECYSTECTOMY  2003  . LEFT AND RIGHT HEART CATHETERIZATION WITH CORONARY ANGIOGRAM N/A 03/22/2013   Procedure: LEFT AND RIGHT HEART CATHETERIZATION WITH CORONARY ANGIOGRAM;  Surgeon: Burnell Blanks, MD;  Location[facility] CATH LAB;  Service: Cardiovascular;  Laterality: N/A;  . MINIMALLY INVASIVE TRICUSPID VALVE REPAIR Right 05/23/2013   Procedure: MINIMALLY INVASIVE TRICUSPID VALVE REPAIR;  Surgeon: Rexene Alberts, MD;  Location: [place];  Service: Open Heart Surgery;  Laterality: Right;  . MITRAL VALVE REPLACEMENT N/A 05/23/2013   Procedure: MITRAL VALVE (MV) REPLACEMENT;  Surgeon: Rexene Alberts, MD;  Location: [place];  Service: Open Heart Surgery;  Laterality: N/A;  . MULTIPLE EXTRACTIONS WITH ALVEOLOPLASTY N/A 04/04/2013   Procedure: Extraction of tooth #'s 1,8,9,13,14,15,23,24,25,26 with alveoloplasty and gross debridement of remaining teeth;   Surgeon: Lenn Cal, DDS;  Location: [facility];  Service: Oral Surgery;  Laterality: N/A;  . TEE WITHOUT CARDIOVERSION N/A 03/18/2013   Procedure: TRANSESOPHAGEAL ECHOCARDIOGRAM (TEE);  Surgeon: Larey Dresser, MD;  Location: [place];  Service: Cardiovascular;  Laterality: N/A;  . TEE WITHOUT CARDIOVERSION N/A 06/17/2013   Procedure: TRANSESOPHAGEAL ECHOCARDIOGRAM (TEE);  Surgeon: Lelon Perla, MD;  Location: [facility] ENDOSCOPY;  Service: Cardiovascular;  Laterality: N/A;  . TUBAL LIGATION  1999     OB History   No obstetric history on file.     Family History  Problem Relation Age of Onset  . Stroke Father   . Cancer Father   . Sarcoidosis Sister   . Heart attack Neg Hx   . Colon cancer Neg Hx   . Stomach cancer Neg Hx   . Pancreatic cancer Neg Hx   . Esophageal cancer Neg Hx     Social History   Tobacco Use  . Smoking status: Former Smoker    Packs/day: 0.50    Years: 36.00    Pack years: 18.00    Types: Cigarettes    Quit date: 03/15/2013    Years since quitting: 7.1  . Smokeless tobacco: Never Used  Vaping Use  . Vaping Use: Never used  Substance Use Topics  . Alcohol use:  Yes    Comment:  "glass of wine"  . Drug use: No    Home Medications Prior to Admission medications   Medication Sig Start Date End Date Taking? Authorizing Provider  acetaminophen (TYLENOL) 500 MG tablet Take 1,000 mg by mouth every 6 (six) hours as needed for headache.   Yes [provider]  aspirin EC 81 MG tablet Take 1 tablet (81 mg total) by mouth daily. 08/26/13  Yes Larey Dresser, MD  gabapentin (NEURONTIN) 300 MG capsule Take 1 capsule (300 mg total) by mouth at bedtime. 02/11/20  Yes Minette Brine, Amy J, NP  Hyprom-Naphaz-Polysorb-Zn Sulf (CLEAR EYES COMPLETE OP) Place 2 drops into both eyes daily as needed (red eyes).   Yes [provider]  warfarin (COUMADIN) 5 MG tablet Take 1.5 tablets Mon, Wed, Fri and 1 tablet all other days of the week or as directed by  Coumadin clinic. Patient taking differently: Take 5-7.5 mg by mouth See admin instructions. Take 7.5mg  tablet by mouth on  Mon, Wed, Fri., then take 5 mg tablet by mouth all other days of the week or as directed by Coumadin clinic. 03/16/20  Yes Jerline Pain, MD  enoxaparin (LOVENOX) 60 MG/0.6ML injection Inject 0.6 mLs (60 mg total) into the skin every 12 (twelve) hours. Patient not taking: Reported on 04/23/2020 01/22/20   Jerline Pain, MD  esomeprazole (NEXIUM) 40 MG capsule Take 30-60 minutes before breakfast Patient not taking: Reported on 04/23/2020 01/09/20   Levin Erp, PA    Allergies    Oxycodone  Review of Systems   Review of Systems  Neurological: Positive for weakness.  All other systems reviewed and are negative.   Physical Exam Updated Vital Signs BP 98/78 (BP Location: Right Arm)   Pulse (!) 107   Temp 97.8 F (36.6 C) (Oral)   Resp 20   LMP 03/22/2013   SpO2 100%   Physical Exam Vitals and nursing note reviewed.  Constitutional:      Appearance: She is well-developed.  HENT:     Head: Normocephalic and atraumatic.  Cardiovascular:     Rate and Rhythm: Regular rhythm. Tachycardia present.  Pulmonary:     Effort: Pulmonary effort is normal. No respiratory distress.     Breath sounds: Normal breath sounds.  Abdominal:     Palpations: Abdomen is soft.     Tenderness: There is no abdominal tenderness. There is no guarding or rebound.  Musculoskeletal:        General: No swelling or tenderness.     Cervical back: Neck supple.  Skin:    General: Skin is warm and dry.  Neurological:     Mental Status: She is alert and oriented to person, place, and time.  Psychiatric:        Behavior: Behavior normal.     ED Results / Procedures / Treatments   Labs (all labs ordered are listed, but only abnormal results are displayed) Labs Reviewed  COMPREHENSIVE METABOLIC PANEL - Abnormal; Notable for the following components:      Result Value   Sodium  127 (*)    Potassium 3.1 (*)    Chloride 88 (*)    Creatinine, Ser 1.17 (*)    Calcium 8.6 (*)    Total Protein 6.2 (*)    Albumin 3.0 (*)    AST 45 (*)    Alkaline Phosphatase 138 (*)    GFR calc non Af Amer 52 (*)    GFR calc Af Wyvonnia Lora  60 (*)    All other components within normal limits  CBC - Abnormal; Notable for the following components:   RBC 1.98 (*)    Hemoglobin 8.9 (*)    HCT 23.8 (*)    MCV 120.2 (*)    MCH 44.9 (*)    MCHC 37.4 (*)    All other components within normal limits  SARS CORONAVIRUS 2 BY RT PCR (HOSPITAL ORDER, [place] [facility])  GASTROINTESTINAL PANEL BY PCR, STOOL (REPLACES STOOL CULTURE)  C DIFFICILE QUICK SCREEN W PCR REFLEX  LIPASE, BLOOD  URINALYSIS, ROUTINE W REFLEX MICROSCOPIC  PROTIME-INR  COMPREHENSIVE METABOLIC PANEL  CBC  VITAMIN B12  FOLATE  IRON AND TIBC  FERRITIN  RETICULOCYTES  MAGNESIUM    EKG None  Radiology CT ABDOMEN PELVIS W CONTRAST  Result Date: 04/23/2020 CLINICAL DATA:  Abdominal pain, gastroenteritis EXAM: CT ABDOMEN AND PELVIS WITH CONTRAST TECHNIQUE: Multidetector CT imaging of the abdomen and pelvis was performed using the standard protocol following bolus administration of intravenous contrast. CONTRAST:  26mL OMNIPAQUE IOHEXOL 300 MG/ML  SOLN COMPARISON:  December 05, 2018 FINDINGS: Lower chest: The visualized heart size within normal limits. No pericardial fluid/thickening. There appears to be mild wall thickening seen at the distal GE junction. The visualized portions of the lungs are clear. Hepatobiliary: The liver is normal in density without focal abnormality.The main portal vein is patent. The patient is status post cholecystectomy. No biliary ductal dilation. Pancreas: Unremarkable. No pancreatic ductal dilatation or surrounding inflammatory changes. Spleen: Normal in size without focal abnormality. Adrenals/Urinary Tract: Both adrenal glands appear normal. The kidneys and collecting system appear  normal without evidence of urinary tract calculus or hydronephrosis. Bladder is unremarkable. Stomach/Bowel: The stomach, small bowel, and colon are normal in appearance. No inflammatory changes, wall thickening, or obstructive findings.The appendix is normal. Vascular/Lymphatic: There are no enlarged mesenteric, retroperitoneal, or pelvic lymph nodes. No significant vascular findings are present. Reproductive: The uterus and adnexa are unremarkable. Other: No evidence of abdominal wall mass or hernia. Musculoskeletal: No acute or significant osseous findings. IMPRESSION: Findings which could be suggestive of mild esophagitis. No other acute intra-abdominal or pelvic pathology to explain the patient's symptoms. Hepatic steatosis Electronically Signed   By: Prudencio Pair M.D.   On: 04/23/2020 22:44    Procedures Procedures (including critical care time)  Medications Ordered in ED Medications  gabapentin (NEURONTIN) capsule 300 mg (300 mg Oral Given 04/23/20 2259)  pantoprazole (PROTONIX) injection 40 mg (40 mg Intravenous Given 04/23/20 2257)  0.9 %  sodium chloride infusion ( Intravenous New Bag/Given 04/23/20 2301)  acetaminophen (TYLENOL) tablet 650 mg (has no administration in time range)    Or  acetaminophen (TYLENOL) suppository 650 mg (has no administration in time range)  ondansetron (ZOFRAN) tablet 4 mg (has no administration in time range)    Or  ondansetron (ZOFRAN) injection 4 mg (has no administration in time range)  potassium chloride 10 mEq in 100 mL IVPB (10 mEq Intravenous New Bag/Given 04/23/20 2302)  sodium chloride 0.9 % bolus 500 mL (500 mLs Intravenous New Bag/Given 04/23/20 2037)  potassium chloride SA (KLOR-CON) CR tablet 40 mEq (40 mEq Oral Given 04/23/20 1950)  ondansetron (ZOFRAN) injection 4 mg (4 mg Intravenous Given 04/23/20 2045)  iohexol (OMNIPAQUE) 300 MG/ML solution 80 mL (80 mLs Intravenous Contrast Given 04/23/20 2236)    ED Course  I have reviewed the triage vital  signs and the nursing notes.  Pertinent labs & imaging results that were  available during my care of the patient were reviewed by me and considered in my medical decision making (see chart for details).    MDM Rules/Calculators/A&P                         patient here for evaluation of vomiting and diarrhea for the last several days. She is mildly tachycardic with marginal blood pressures. She has a mild anemia, which is stable similar to priors, no reports of bleeding at home. She has a mild increase in her creatinine compared to baseline. She has no significant abdominal tenderness. She appears mildly dehydrated on evaluation. She was treated with IV fluids. Plan to admit for further treatment. Hospitalist consulted for mission.  Final Clinical Impression(s) / ED Diagnoses Final diagnoses:  Dehydration    Rx / DC Orders ED Discharge Orders    None       Quintella Reichert, MD 04/23/20 2307

## 2020-04-23 NOTE — Progress Notes (Signed)
ANTICOAGULATION CONSULT NOTE - Initial Consult  Pharmacy Consult for Heparin (warfarin on hold) Indication: Mechanical MVR  Allergies  Allergen Reactions  . Oxycodone Itching    Patient Measurements: ~50 kg  Vital Signs: BP: 98/78 (07/29 2300) Pulse Rate: 107 (07/29 2300)  Labs: Recent Labs    04/23/20 1219 04/23/20 2250  HGB 8.9*  --   HCT 23.8*  --   PLT 337  --   LABPROT  --  22.0*  INR  --  2.0*  CREATININE 1.17*  --     Estimated Creatinine Clearance: 41.8 mL/min (A) (by C-G formula based on SCr of 1.17 mg/dL (H)).   Medical History: Past Medical History:  Diagnosis Date  . Acute diastolic heart failure ([place]) 03/15/2013  . Anemia   . B12 deficiency   . Carotid stenosis    Carotid US 2/17: bilateral ICA 1-39% >> FU prn  . Childhood asthma   . Complete heart block ([place])   . Epigastric hernia 200's  . GERD (gastroesophageal reflux disease)   . Heart murmur   . History of blood transfusion    "14 w/1st pregnancy; 2 w/last C-section" (03/15/2013)  . Hx of echocardiogram    a. Echo 4/16:  Mild focal basal septal hypertrophy, EF 60-65%, no RWMA, Mechanical MVR ok with mild central regurgitation and no perivalvular leak, small mobile density attached to valvular ring (1.5x1 cm) - post surgical changes vs SBE, mild LAE;   b. Echo 2/17:  Mild post wall LVH, EF 55-60%, no RWMA, mechanical MVR ok (mean 5 mmHg), normal RVSF  . Hypertension    no pcp   will go to [facility]  . Macrocytic anemia   . 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 S/P mitral valve replacement with metallic valve 5/68/1275   50mm Sorin Carbomedics mechanical prosthesis via right mini thoracotomy approach  . 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   Complex valvuloplasty including Cor-matrix ECM patch augmentation of anterior and lateral leaflets with 12mm Edwards mc3 ring annuloplasty via right mini-thoracotomy approach  . Severe mitral regurgitation 04/22/2013  . Sinus tachycardia   .  SVT (supraventricular tachycardia) ([place]) 03/16/2013  . TIA (transient ischemic attack)   . Tricuspid regurgitation      Assessment: 59 y/o F on warfarin PTA for hx of mechanical MVR, holding warfarin and starting heparin in case procedures are needed during hospital stay, INR goal PTA is 2.5-3.5, INR this evening is 2, will start heparin now. Hgb 8.9. Renal function ok.   Goal of Therapy:  Heparin level 0.3-0.7 units/ml Monitor platelets by anticoagulation protocol: Yes   Plan:  Start heparin drip at 800 units/hr (previously therapeutic rate) 0900 heparin level  Daily CBC/heparin level/INR Monitor for bleeding  Narda Bonds, PharmD, BCPS Clinical Pharmacist Phone: [phone]

## 2020-04-23 NOTE — ED Notes (Signed)
Patient transported to CT 

## 2020-04-23 NOTE — Progress Notes (Signed)
Cardiology Office Note:    Date:  04/23/2020   ID:  Mary [name], DOB [birth year], MRN [id number]  PCP:  Antony Blackbird, MD  [facility] Cardiologist:  Candee Furbish, MD  [facility] Electrophysiologist:  Cristopher Peru, MD   Referring MD: Antony Blackbird, MD     History of Present Illness:     Mary [name] is a 59 y.o. female with Severe MR (in setting of SBE) June 2014 ? S/p R mini-thoracotomy with mechanical MVR and TV repair 6/14 ? Post op c/b CHB >> resolved; no pacemaker needed ? Echocardiogram 02/2019: normal EF, normally functioning MVR and TV repair ? Echo 12/2019: EF 55-60, stable MVR, TV repair  Transient Complete Heart Block ? Holter 03/2014: asymptomatic CHB during sleep >> seen by EP Lovena Le); no PPM needed ? Holter 7/19: normal sinus rhythm, intermittent CHB during daytime >> seen by EP; PPM recommended; patient never called back to arrange ? Last seen by Dr. Lovena Le 11/2019: no pacer indicated   Hx of syncope 03/2019; ILR recommended; no insurance coverage  S/p TIA in 05/2016 (sub therapeutic INR)  Sinus tachycardia ? Beta blocker tried in 5/17 but stopped due to high grade heart block >> seen by EP; no PPM needed  Cardiac Catheterization2014: No CAD Macrocytic anemia2/2 B12 deficiency  Used to see Dr. Saunders Revel.  Prior to that Dr. Aundra Dubin.  Implantable loop recorder was recommended but insurance would not pay for this.  Has chronic fatigue weakness cough tongue numbness lip swelling numbness decreased appetite.  Admitted back in April with symptomatic anemia as well as dehydration related to gastroenteritis and acute kidney injury.  Hemoglobin was 6.9 at the time.  Hemolysis work-up was unremarkable.  She does have a mechanical valve but her echocardiogram did not demonstrate any evidence of hemolysis related to mechanical valve.  She did have some dark stools and was recommended to go to the emergency room after I saw her in follow-up in April 2021.  The patient  declined at the time.  Hemoglobin was 10.6.  She is been seen by gastroenterology.  Lovenox bridge for any endoscopies needed.  Mechanical valve.  Has been experiencing worsening symptoms.  Very weak.  Had an episode of syncope this morning.  Basically experiencing failure to thrive.  Past Medical History:  Diagnosis Date  . Acute diastolic heart failure ([place]) 03/15/2013  . Anemia   . B12 deficiency   . Carotid stenosis    Carotid US 2/17: bilateral ICA 1-39% >> FU prn  . Childhood asthma   . Complete heart block ([place])   . Epigastric hernia 200's  . GERD (gastroesophageal reflux disease)   . Heart murmur   . History of blood transfusion    "14 w/1st pregnancy; 2 w/last C-section" (03/15/2013)  . Hx of echocardiogram    a. Echo 4/16:  Mild focal basal septal hypertrophy, EF 60-65%, no RWMA, Mechanical MVR ok with mild central regurgitation and no perivalvular leak, small mobile density attached to valvular ring (1.5x1 cm) - post surgical changes vs SBE, mild LAE;   b. Echo 2/17:  Mild post wall LVH, EF 55-60%, no RWMA, mechanical MVR ok (mean 5 mmHg), normal RVSF  . Hypertension    no pcp   will go to [facility]  . Macrocytic anemia   . 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 S/P mitral valve replacement with metallic valve 5/78/4696   73mm Sorin Carbomedics mechanical prosthesis via right mini thoracotomy approach  . S/P tricuspid valve  repair 05/23/2013   Complex valvuloplasty including Cor-matrix ECM patch augmentation of anterior and lateral leaflets with 33mm Edwards mc3 ring annuloplasty via right mini-thoracotomy approach  . Severe mitral regurgitation 04/22/2013  . Sinus tachycardia   . SVT (supraventricular tachycardia) ([place]) 03/16/2013  . TIA (transient ischemic attack)   . Tricuspid regurgitation     Past Surgical History:  Procedure Laterality Date  . APPENDECTOMY  [id number]  . CARDIAC CATHETERIZATION    . CARDIAC VALVE REPLACEMENT    . CESAREAN SECTION  1983    . CESAREAN SECTION WITH BILATERAL TUBAL LIGATION  1999  . FEMORAL HERNIA REPAIR Right 05/23/2013   Procedure: HERNIA REPAIR FEMORAL;  Surgeon: Rexene Alberts, MD;  Location: [place];  Service: Open Heart Surgery;  Laterality: Right;  . INTRAOPERATIVE TRANSESOPHAGEAL ECHOCARDIOGRAM N/A 05/23/2013   Procedure: INTRAOPERATIVE TRANSESOPHAGEAL ECHOCARDIOGRAM;  Surgeon: Rexene Alberts, MD;  Location: [place];  Service: Open Heart Surgery;  Laterality: N/A;  . LAPAROSCOPIC CHOLECYSTECTOMY  2003  . LEFT AND RIGHT HEART CATHETERIZATION WITH CORONARY ANGIOGRAM N/A 03/22/2013   Procedure: LEFT AND RIGHT HEART CATHETERIZATION WITH CORONARY ANGIOGRAM;  Surgeon: Burnell Blanks, MD;  Location: [facility] CATH LAB;  Service: Cardiovascular;  Laterality: N/A;  . MINIMALLY INVASIVE TRICUSPID VALVE REPAIR Right 05/23/2013   Procedure: MINIMALLY INVASIVE TRICUSPID VALVE REPAIR;  Surgeon: Rexene Alberts, MD;  Location: [place];  Service: Open Heart Surgery;  Laterality: Right;  . MITRAL VALVE REPLACEMENT N/A 05/23/2013   Procedure: MITRAL VALVE (MV) REPLACEMENT;  Surgeon: Rexene Alberts, MD;  Location: [place];  Service: Open Heart Surgery;  Laterality: N/A;  . MULTIPLE EXTRACTIONS WITH ALVEOLOPLASTY N/A 04/04/2013   Procedure: Extraction of tooth #'s 1,8,9,13,14,15,23,24,25,26 with alveoloplasty and gross debridement of remaining teeth;  Surgeon: Lenn Cal, DDS;  Location: [place];  Service: Oral Surgery;  Laterality: N/A;  . TEE WITHOUT CARDIOVERSION N/A 03/18/2013   Procedure: TRANSESOPHAGEAL ECHOCARDIOGRAM (TEE);  Surgeon: Larey Dresser, MD;  Location: [place];  Service: Cardiovascular;  Laterality: N/A;  . TEE WITHOUT CARDIOVERSION N/A 06/17/2013   Procedure: TRANSESOPHAGEAL ECHOCARDIOGRAM (TEE);  Surgeon: Lelon Perla, MD;  Location: [facility] ENDOSCOPY;  Service: Cardiovascular;  Laterality: N/A;  . TUBAL LIGATION  1999    Current Medications: Current Meds  Medication Sig  . acetaminophen (TYLENOL) 500 MG  tablet Take 1,000 mg by mouth every 6 (six) hours as needed for headache.  [name] aspirin EC 81 MG tablet Take 1 tablet (81 mg total) by mouth daily.  [name] enoxaparin (LOVENOX) 60 MG/0.6ML injection Inject 0.6 mLs (60 mg total) into the skin every 12 (twelve) hours.  [name] esomeprazole (NEXIUM) 40 MG capsule Take 30-60 minutes before breakfast  . gabapentin (NEURONTIN) 300 MG capsule Take 1 capsule (300 mg total) by mouth at bedtime.  . Hyprom-Naphaz-Polysorb-Zn Sulf (CLEAR EYES COMPLETE OP) Place 2 drops into both eyes daily as needed (red eyes).  . warfarin (COUMADIN) 5 MG tablet Take 1.5 tablets Mon, Wed, Fri and 1 tablet all other days of the week or as directed by Coumadin clinic.     Allergies:   Oxycodone   Social History   Socioeconomic History  . Marital status: Single    Spouse name: Not on file  . Number of children: Not on file  . Years of education: Not on file  . Highest education level: Not on file  Occupational History  . Not on file  Tobacco Use  . Smoking status: Former Smoker    Packs/day: 0.50  Years: 36.00    Pack years: 18.00    Types: Cigarettes    Quit date: 03/15/2013    Years since quitting: 7.1  . Smokeless tobacco: Never Used  Vaping Use  . Vaping Use: Never used  Substance and Sexual Activity  . Alcohol use: Yes    Comment:  "glass of wine"  . Drug use: No  . Sexual activity: Never  Other Topics Concern  . Not on file  Social History Narrative  . Not on file   Social Determinants of Health   Financial Resource Strain:   . Difficulty of Paying Living Expenses:   Food Insecurity:   . Worried About Charity fundraiser in the Last Year:   . Arboriculturist in the Last Year:   Transportation Needs:   . Film/video editor (Medical):   [name] Lack of Transportation (Non-Medical):   Physical Activity:   . Days of Exercise per Week:   . Minutes of Exercise per Session:   Stress:   . Feeling of Stress :   Social Connections:   . Frequency of  Communication with Friends and Family:   . Frequency of Social Gatherings with Friends and Family:   . Attends Religious Services:   . Active Member of Clubs or Organizations:   . Attends Archivist Meetings:   [name] Marital Status:      Family History: The patient's family history includes Cancer in her father; Sarcoidosis in her sister; Stroke in her father. There is no history of Heart attack, Colon cancer, Stomach cancer, Pancreatic cancer, or Esophageal cancer.  ROS:   Please see the history of present illness.    Has experienced weakness, nausea, fatigue, diarrhea, occasional emesis, numbness of her tongue, lips.  All other systems reviewed and are negative.  EKGs/Labs/Other Studies Reviewed:    The following studies were reviewed today:  Severe MR (in setting of SBE) June 2014 ? S/p R mini-thoracotomy with mechanical MVR and TV repair 6/14 ? Post op c/b CHB >> resolved; no pacemaker needed ? Echocardiogram 02/2019: normal EF, normally functioning MVR and TV repair ? Echo 12/2019: EF 55-60, stable MVR, TV repair  Transient Complete Heart Block ? Holter 03/2014: asymptomatic CHB during sleep >> seen by EP Lovena Le); no PPM needed ? Holter 7/19: normal sinus rhythm, intermittent CHB during daytime >> seen by EP; PPM recommended; patient never called back to arrange ? Last seen by Dr. Lovena Le 11/2019: no pacer indicated   Hx of syncope 03/2019; ILR recommended; no insurance coverage  S/p TIA in 05/2016 (sub therapeutic INR)  Sinus tachycardia ? Beta blocker tried in 5/17 but stopped due to high grade heart block >> seen by EP; no PPM needed  Cardiac Catheterization2014: No CAD  Macrocytic anemia2/2 B12 deficiency  Prior CV studies: Echocardiogram 12/31/2019 EF 55-60, no R WMA, normal RV SF, mitral valve replacement without evidence of regurgitation, tricuspid valve repair stable  Echocardiogram 03/06/2019 EF > 65, no RWMA, s/p mechanical MVR with normal function, s/p  TV repair with trivial TR  Holter 03/30/18  Normal sinus rhythm and sinus tachycardia with average heart rate 109 bpm. Heart rate ranged from 27 to 171 bpm.  Intermittent complete heart block with slow ventricular response at 47 bpm during the daytime.  SVT at 171 bpm  Occasional PVCs and wide-complex tachycardia up to 5 beats  Echocardiogram 02/06/18 EF 55-60, no RWMA, normally functioning mechanical MVR, TV repair functioning normally  Event Monitor 02/24/16 1 episode  of high grade heart block at night. Otherwise, mild sinus tachycardia.   Carotid US 9/17 Bilateral: soft plaque throughout CCA, ICA and ECA. 1-39% ICA plaquing. Vertebral artery flow is antegrade.  Echo 06/06/16 EF 55-60%, mechanical MVR functioning normally, mild RAE, TV repair functioning normally  Echo 11/03/15 Posterior wall mild LVH, EF 55-60%, normal wall motion, mechanical MVR okay, normal RVSF  Echo 01/01/15 Mild focal basal septal hypertrophy, EF 60-65%, normal wall motion, S/P MVR (St Jude mechanical valve) functioning well, mild MR (small mobile echodensity-likely redundant chord versus suture), mild LAE, TV mean gradient 4 mmHg.   Echo 7/15 Mild LVH, EF 60-65%, normal wall motion, grade 1 diastolic dysfunction, S/P MVR (St Jude mechanical valve) and appears functioning well, mild MR, no perivalvular leak, mild LAE, mildly reduced RVSF.   Carotid US 8/14 Bilateral - 1% to 39% ICA stenosis   Cardiac cath 6/14 Left main: No obstructive disease.  Left Anterior Descending Artery: No obstructive disease noted.  Circumflex Artery: No obstructive disease.  Right Coronary Artery: no obstructive disease.  Left Ventricular Angiogram: LVEF 65-70%. 3+ MR Distal Aortogram: No aneurysm. No stenosis.  EKG:  EKG is  ordered today.  The ekg ordered today demonstrates sinus tachycardia rate 115  Recent Labs: 12/29/2019: ALT 22; B Natriuretic Peptide 44.5; TSH 4.393 12/31/2019: Magnesium 1.7 01/01/2020:  BUN 6; Creatinine, Ser 0.79; Potassium 3.6; Sodium 141 01/22/2020: Hemoglobin 9.9; Platelets 431  Recent Lipid Panel    Component Value Date/Time   CHOL 189 05/31/2018 1108   TRIG 300 (H) 05/31/2018 1108   HDL 68 05/31/2018 1108   CHOLHDL 2.8 05/31/2018 1108   CHOLHDL 3.2 06/06/2016 0135   VLDL 22 06/06/2016 0135   LDLCALC 61 05/31/2018 1108    Physical Exam:    VS:  BP (!) 84/60   Pulse (!) 115   Ht 5\' 2"  (1.575 m)   Wt 110 lb (49.9 kg)   LMP 03/22/2013   SpO2 (!) 87%   BMI 20.12 kg/m     Wt Readings from Last 3 Encounters:  04/23/20 110 lb (49.9 kg)  02/11/20 117 lb 6.4 oz (53.3 kg)  01/22/20 115 lb (52.2 kg)     GEN: Weak, feeble appearing HEENT: Normal NECK: No JVD; No carotid bruits LYMPHATICS: No lymphadenopathy CARDIAC: Mechanical S1 RRR, no murmurs, rubs, gallops RESPIRATORY:  Clear to auscultation without rales, wheezing or rhonchi, mildly increased respiratory rate ABDOMEN: Soft, non-tender, non-distended MUSCULOSKELETAL:  No edema; No deformity  SKIN: Warm and dry NEUROLOGIC:  Alert and oriented x 3 PSYCHIATRIC:  Normal affect   ASSESSMENT:    1. Failure to thrive in adult   2. Syncope, unspecified syncope type   3. Macrocytic anemia   4. S/P mitral valve replacement with metallic valve    PLAN:    In order of problems listed above:   Failure to thrive -Not able to keep food down, weak, blood pressures are soft.  This morning had an episode of syncope.  Recommended transportation to the emergency room via EMS.  She is willing to go.  She will likely need hospitalist admit with possible endoscopies while she is there.  Fairly chronic issues acutely worsened.  Chronic cough.  Would recommend Covid testing.  She lives alone.  Not sure she is safe at this time.  No new cardiology recommendations at this time.  Please let us know if we can be of further assistance.  Intermittent complete heart block -Has been seen by EP, last by Dr. Lovena Le  who at the  time did not feel pacemaker was necessary and to continue trial of lack of AV nodal blocking agents such as metoprolol.  Here today in clinic her pulse was 40 by pulse ox, blood pressure 84/60.  I think that the pulse ox may have been spurious value.  EKG however demonstrated once again sinus tachycardia with rate 115.  Felt poor, oxygen saturation 87.   Mechanical mitral valve with chronic anticoagulation -Echocardiogram stable.  Tricuspid valve repair as well.  No hemolysis.  Endoscopy recommended  Sinus tachycardia, chronic -Not a candidate for beta-blockers because of intermittent complete heart block.  Dr. Lovena Le did not recommend pacemaker.  Microcytic anemia -Work-up per primary team.  GI.  Trouble eating.  Unfortunately she canceled her endoscopies.  Could not afford prep.  Perhaps while she is in the hospital she can have this performed. -9.9, hemoglobin 10.6 previously.   Medication Adjustments/Labs and Tests Ordered: Current medicines are reviewed at length with the patient today.  Concerns regarding medicines are outlined above.  Orders Placed This Encounter  Procedures  . EKG 12-Lead   No orders of the defined types were placed in this encounter.   Patient Instructions  You are being sent to the ED of [facility] for further evaluation and treatment.  Medication Instructions:  The current medical regimen is effective;  continue present plan and medications.  *If you need a refill on your cardiac medications before your next appointment, please call your pharmacy*  Follow-Up: At [facility], you and your health needs are our priority.  As part of our continuing mission to provide you with exceptional heart care, we have created designated Provider Care Teams.  These Care Teams include your primary Cardiologist (physician) and Advanced Practice Providers (APPs -  Physician Assistants and Nurse Practitioners) who all work together to provide you with the care you need,  when you need it.  We recommend signing up for the patient portal called "MyChart".  Sign up information is provided on this After Visit Summary.  MyChart is used to connect with patients for Virtual Visits (Telemedicine).  Patients are able to view lab/test results, encounter notes, upcoming appointments, etc.  Non-urgent messages can be sent to your provider as well.   To learn more about what you can do with MyChart, go to [email or web address].    Follow up as instructed after hospital discharge.  Thank you for choosing [facility]!!         Signed, Candee Furbish, MD  04/23/2020 10:48 AM    [place]

## 2020-04-24 ENCOUNTER — Encounter (HOSPITAL_COMMUNITY): Payer: Self-pay | Admitting: Anesthesiology

## 2020-04-24 DIAGNOSIS — Z8679 Personal history of other diseases of the circulatory system: Secondary | ICD-10-CM

## 2020-04-24 DIAGNOSIS — Z952 Presence of prosthetic heart valve: Secondary | ICD-10-CM

## 2020-04-24 DIAGNOSIS — Z7901 Long term (current) use of anticoagulants: Secondary | ICD-10-CM

## 2020-04-24 DIAGNOSIS — E871 Hypo-osmolality and hyponatremia: Secondary | ICD-10-CM

## 2020-04-24 DIAGNOSIS — R112 Nausea with vomiting, unspecified: Secondary | ICD-10-CM

## 2020-04-24 DIAGNOSIS — R1110 Vomiting, unspecified: Secondary | ICD-10-CM | POA: Diagnosis present

## 2020-04-24 DIAGNOSIS — R197 Diarrhea, unspecified: Secondary | ICD-10-CM | POA: Diagnosis present

## 2020-04-24 LAB — COMPREHENSIVE METABOLIC PANEL
ALT: 18 U/L (ref 0–44)
AST: 36 U/L (ref 15–41)
Albumin: 2.6 g/dL — ABNORMAL LOW (ref 3.5–5.0)
Alkaline Phosphatase: 123 U/L (ref 38–126)
Anion gap: 11 (ref 5–15)
BUN: 10 mg/dL (ref 6–20)
CO2: 24 mmol/L (ref 22–32)
Calcium: 8 mg/dL — ABNORMAL LOW (ref 8.9–10.3)
Chloride: 94 mmol/L — ABNORMAL LOW (ref 98–111)
Creatinine, Ser: 1.72 mg/dL — ABNORMAL HIGH (ref 0.44–1.00)
GFR calc Af Amer: 38 mL/min — ABNORMAL LOW (ref >60)
GFR calc non Af Amer: 32 mL/min — ABNORMAL LOW (ref >60)
Glucose, Bld: 97 mg/dL (ref 70–99)
Potassium: 4.7 mmol/L (ref 3.5–5.1)
Sodium: 129 mmol/L — ABNORMAL LOW (ref 135–145)
Total Bilirubin: 0.8 mg/dL (ref 0.3–1.2)
Total Protein: 5.4 g/dL — ABNORMAL LOW (ref 6.5–8.1)

## 2020-04-24 LAB — RETICULOCYTES
Immature Retic Fract: 15.4 % (ref 2.3–15.9)
RBC.: 1.57 MIL/uL — ABNORMAL LOW (ref 3.87–5.11)
Retic Count, Absolute: 25 10*3/uL (ref 19.0–186.0)
Retic Ct Pct: 1.6 % (ref 0.4–3.1)

## 2020-04-24 LAB — GASTROINTESTINAL PANEL BY PCR, STOOL (REPLACES STOOL CULTURE)

## 2020-04-24 LAB — URINALYSIS, ROUTINE W REFLEX MICROSCOPIC
Bacteria, UA: NONE SEEN
Bilirubin Urine: NEGATIVE
Glucose, UA: NEGATIVE mg/dL
Ketones, ur: NEGATIVE mg/dL
Nitrite: NEGATIVE
Protein, ur: NEGATIVE mg/dL
Specific Gravity, Urine: 1.034 — ABNORMAL HIGH (ref 1.005–1.030)
pH: 7 (ref 5.0–8.0)

## 2020-04-24 LAB — CBC
HCT: 19.5 % — ABNORMAL LOW (ref 36.0–46.0)
Hemoglobin: 7.2 g/dL — ABNORMAL LOW (ref 12.0–15.0)
MCH: 45 pg — ABNORMAL HIGH (ref 26.0–34.0)
MCHC: 36.9 g/dL — ABNORMAL HIGH (ref 30.0–36.0)
MCV: 121.9 fL — ABNORMAL HIGH (ref 80.0–100.0)
Platelets: 312 10*3/uL (ref 150–400)
RBC: 1.6 MIL/uL — ABNORMAL LOW (ref 3.87–5.11)
RDW: 14.7 % (ref 11.5–15.5)
WBC: 7.1 10*3/uL (ref 4.0–10.5)
nRBC: 0 % (ref 0.0–0.2)

## 2020-04-24 LAB — FERRITIN: Ferritin: 186 ng/mL (ref 11–307)

## 2020-04-24 LAB — PROTIME-INR
INR: 2 — ABNORMAL HIGH (ref 0.8–1.2)
Prothrombin Time: 21.8 seconds — ABNORMAL HIGH (ref 11.4–15.2)

## 2020-04-24 LAB — IRON AND TIBC
Iron: 168 ug/dL (ref 28–170)
Saturation Ratios: 87 % — ABNORMAL HIGH (ref 10.4–31.8)
TIBC: 193 ug/dL — ABNORMAL LOW (ref 250–450)
UIBC: 25 ug/dL

## 2020-04-24 LAB — HEPARIN LEVEL (UNFRACTIONATED)
Heparin Unfractionated: 0.16 IU/mL — ABNORMAL LOW (ref 0.30–0.70)
Heparin Unfractionated: 0.45 IU/mL (ref 0.30–0.70)

## 2020-04-24 LAB — C DIFFICILE QUICK SCREEN W PCR REFLEX
C Diff antigen: NEGATIVE
C Diff interpretation: NOT DETECTED
C Diff toxin: NEGATIVE

## 2020-04-24 LAB — VITAMIN B12: Vitamin B-12: 376 pg/mL (ref 180–914)

## 2020-04-24 LAB — MAGNESIUM: Magnesium: 1.5 mg/dL — ABNORMAL LOW (ref 1.7–2.4)

## 2020-04-24 LAB — FOLATE: Folate: 3.7 ng/mL — ABNORMAL LOW (ref >5.9)

## 2020-04-24 MED ORDER — BISACODYL 5 MG PO TBEC
20.0000 mg | DELAYED_RELEASE_TABLET | Freq: Once | ORAL | Status: AC
  Administered 2020-04-24: 20 mg via ORAL
  Filled 2020-04-24: qty 4

## 2020-04-24 MED ORDER — FOLIC ACID 1 MG PO TABS
1.0000 mg | ORAL_TABLET | Freq: Every day | ORAL | Status: DC
  Administered 2020-04-24 – 2020-05-04 (×11): 1 mg via ORAL
  Filled 2020-04-24 (×12): qty 1

## 2020-04-24 MED ORDER — BISACODYL 5 MG PO TBEC: 20.0000 mg | DELAYED_RELEASE_TABLET | Freq: Once | ORAL | Status: DC

## 2020-04-24 MED ORDER — PEG-KCL-NACL-NASULF-NA ASC-C 100 G PO SOLR
0.5000 | Freq: Once | ORAL | Status: AC
  Administered 2020-04-24: 100 g via ORAL
  Filled 2020-04-24: qty 1

## 2020-04-24 MED ORDER — SODIUM CHLORIDE 0.9% FLUSH
10.0000 mL | Freq: Two times a day (BID) | INTRAVENOUS | Status: DC
  Administered 2020-04-24 – 2020-04-29 (×8): 10 mL

## 2020-04-24 MED ORDER — SODIUM CHLORIDE 0.9% FLUSH: 10.0000 mL | INTRAVENOUS | Status: DC | PRN

## 2020-04-24 MED ORDER — PEG-KCL-NACL-NASULF-NA ASC-C 100 G PO SOLR: 1.0000 | Freq: Once | ORAL | Status: DC

## 2020-04-24 MED ORDER — MAGNESIUM SULFATE 2 GM/50ML IV SOLN
2.0000 g | Freq: Once | INTRAVENOUS | Status: AC
  Administered 2020-04-24: 2 g via INTRAVENOUS
  Filled 2020-04-24: qty 50

## 2020-04-24 MED ORDER — METOCLOPRAMIDE HCL 5 MG/ML IJ SOLN
10.0000 mg | Freq: Four times a day (QID) | INTRAMUSCULAR | Status: DC
  Filled 2020-04-24: qty 2

## 2020-04-24 NOTE — Plan of Care (Signed)
  Problem: Clinical Measurements: Goal: Ability to maintain clinical measurements within normal limits will improve Outcome: Progressing Goal: Will remain free from infection Outcome: Progressing   Problem: Education: Goal: Knowledge of General Education information will improve Description: Including pain rating scale, medication(s)/side effects and non-pharmacologic comfort measures Outcome: Progressing   Problem: Clinical Measurements: Goal: Will remain free from infection Outcome: Progressing   Problem: Activity: Goal: Risk for activity intolerance will decrease Outcome: Progressing

## 2020-04-24 NOTE — Progress Notes (Signed)
[facility] for Heparin (warfarin on hold) Indication: Mechanical MVR  Allergies  Allergen Reactions   Oxycodone Itching    Patient Measurements: 49.9 kg (110 lb)  Vital Signs: Temp: 98 F (36.7 C) (07/30 1126) Temp Source: Oral (07/30 1126) BP: 132/76 (07/30 1126) Pulse Rate: 102 (07/30 1126)  Labs: Recent Labs    04/23/20 1219 04/23/20 2250 04/24/20 0500 04/24/20 0914 04/24/20 1354  HGB 8.9*  --  7.2*  --   --   HCT 23.8*  --  19.5*  --   --   PLT 337  --  312  --   --   LABPROT  --  22.0* 21.8*  --   --   INR  --  2.0* 2.0*  --   --   HEPARINUNFRC  --   --   --  0.16* 0.45  CREATININE 1.17*  --  1.72*  --   --     Estimated Creatinine Clearance: 28.4 mL/min (A) (by C-G formula based on SCr of 1.72 mg/dL (H)).  Assessment: 59 yo female on warfarin PTA for hx of mechanical MVR, holding warfarin and starting heparin in case procedures are needed during hospital stay. INR goal PTA is 2.5-3.5 and INR is 2 so heparin was started. Patient's PTA warfarin dose is 7.5 mg MWF and 5 mg on all other days of the week.  Initial heparin level is low but there were issues with the line and heparin was off for at least 20-30 minutes within an hour of the heparin level being drawn.The level resulted at 0.16, but was potentially inaccurate. The heparin level was rechecked and resulted therapeutic at 0.45. Heparin will be stopped 7/31 at 0400 for procedure, will continue heparin drip at the current rate. No bleeding is documented or noted, CBC is WNL.   Goal of Therapy:  Heparin level 0.3-0.7 units/ml Monitor platelets by anticoagulation protocol: Yes   Plan:  Continue heparin IV at 800 units/hr Discontinue daily heparin level Monitor for signs and symptoms of bleeding  Shauna Hugh, PharmD, [place]  PGY-1 Pharmacy Resident 04/24/2020 2:43 PM  Please check [email or web address] for unit-specific pharmacy phone numbers.

## 2020-04-24 NOTE — ED Notes (Signed)
Breakfast ordered--Pearlena Ow  

## 2020-04-24 NOTE — ED Notes (Signed)
Lunch Tray Ordered @ 1019.

## 2020-04-24 NOTE — ED Notes (Signed)
Patient  Is a hard  X2 stick reported to nurse April RN

## 2020-04-24 NOTE — ED Notes (Signed)
Received pt, pt IV infusion continues to beep with error message , occluded on pt side. This nurse to assess, resistance to flush pt midline, no obvious infiltration, pt not voicing complaints of pain at site. STAT IV team consult placed d/t pt ordered continuous heparin infusion

## 2020-04-24 NOTE — Progress Notes (Signed)
Progress Note    Mary [name]  OJJ:[id number] DOB: 10-Aug-1961  DOA: 04/23/2020 PCP: Antony Blackbird, MD    Brief Narrative:     Medical records reviewed and are as summarized below:  Mary [name] is an 59 y.o. female with a past medical history of mitral valve replacement on warfarin, history of macrocytic anemia, history of GERD who was in her usual state of health till about 4 to 5 days ago when she started developing nausea followed by onset of vomiting and diarrhea.  She denies any blood in the emesis or in the stool.  She has had profuse amount of watery stool.  Denies any recent antibiotic use.  Denies eating out anywhere recently.  She does have some abdominal discomfort associated with the symptoms.  Denies any fever or chills.  Denies any dysuria.  She does admit to acid reflux symptoms.  She had similar presentation back in April when she had to be hospitalized for dehydration.  She has never had upper endoscopy or colonoscopy.  It appears that these procedures were planned in May however patient could not find transportation and could not afford the medications needed for prep.  Assessment/Plan:   Principal Problem:   Acute gastroenteritis Active Problems:   Warfarin anticoagulation   History of rheumatic heart disease   Hyponatremia   Dehydration   Macrocytic anemia   N&V (nausea and vomiting)   Acute gastroenteritis:  -GI consult for endoscopies -symptomatic treatment  Hyponatremia and hypokalemia/dehydration:  -setting of GI loss -replete and recheck  History of rheumatic heart disease with history of mitral valve replacement with mechanical valve: Followed by Dr. Marlou Porch with cardiology.  She is on warfarin for anticoagulation. Goal 2.5-3.5 -IV heparin until after procedure -let INR drift down  macrocytic anemia:  -folic acid low- replace PO -transfuse for <7  Family Communication/Anticipated D/C date and plan/Code Status   DVT prophylaxis:  heparin gtt Code Status: Full Code.  Disposition Plan: Status is: Inpatient  Remains inpatient appropriate because:IV treatments appropriate due to intensity of illness or inability to take PO   Dispo: The patient is from: Home              Anticipated d/c is to: Home              Anticipated d/c date is: > 3 days              Patient currently is not medically stable to d/c. after procedures, will need bridge back to INR of 2.5-3.5         Medical Consultants:    GI  Subjective:   C/o chills/being cold  Objective:    Vitals:   04/24/20 0954 04/24/20 1030 04/24/20 1042 04/24/20 1126  BP:  (!) 130/87  (!) 132/76  Pulse: 97   102  Resp:    14  Temp:   98.1 F (36.7 C) 98 F (36.7 C)  TempSrc:   Oral Oral  SpO2: 99%   100%    Intake/Output Summary (Last 24 hours) at 04/24/2020 1255 Last data filed at 04/24/2020 1138 Gross per 24 hour  Intake 840 ml  Output --  Net 840 ml   There were no vitals filed for this visit.  Exam:  General: Appearance:    Well developed, well nourished female in no acute distress     Lungs:     Clear to auscultation bilaterally, respirations unlabored  Heart:    Tachycardic. Normal rhythm.  MS:   All extremities are intact.   Neurologic:   Awake, alert, oriented x 3. No apparent focal neurological           defect.     Data Reviewed:   I have personally reviewed following labs and imaging studies:  Labs: Labs show the following:   Basic Metabolic Panel: Recent Labs  Lab 04/23/20 1219 04/24/20 0500  NA 127* 129*  K 3.1* 4.7  CL 88* 94*  CO2 24 24  GLUCOSE 80 97  BUN 9 10  CREATININE 1.17* 1.72*  CALCIUM 8.6* 8.0*  MG  --  1.5*   GFR Estimated Creatinine Clearance: 28.4 mL/min (A) (by C-G formula based on SCr of 1.72 mg/dL (H)). Liver Function Tests: Recent Labs  Lab 04/23/20 1219 04/24/20 0500  AST 45* 36  ALT 23 18  ALKPHOS 138* 123  BILITOT 0.9 0.8  PROT 6.2* 5.4*  ALBUMIN 3.0* 2.6*   Recent Labs   Lab 04/23/20 1219  LIPASE 15   No results for input(s): AMMONIA in the last 168 hours. Coagulation profile Recent Labs  Lab 04/23/20 2250 04/24/20 0500  INR 2.0* 2.0*    CBC: Recent Labs  Lab 04/23/20 1219 04/24/20 0500  WBC 7.2 7.1  HGB 8.9* 7.2*  HCT 23.8* 19.5*  MCV 120.2* 121.9*  PLT 337 312   Cardiac Enzymes: No results for input(s): CKTOTAL, CKMB, CKMBINDEX, TROPONINI in the last 168 hours. BNP (last 3 results) No results for input(s): PROBNP in the last 8760 hours. CBG: No results for input(s): GLUCAP in the last 168 hours. D-Dimer: No results for input(s): DDIMER in the last 72 hours. Hgb A1c: No results for input(s): HGBA1C in the last 72 hours. Lipid Profile: No results for input(s): CHOL, HDL, LDLCALC, TRIG, CHOLHDL, LDLDIRECT in the last 72 hours. Thyroid function studies: No results for input(s): TSH, T4TOTAL, T3FREE, THYROIDAB in the last 72 hours.  Invalid input(s): FREET3 Anemia work up: Recent Labs    04/24/20 0500  VITAMINB12 376  FOLATE 3.7*  FERRITIN 186  TIBC 193*  IRON 168  RETICCTPCT 1.6   Sepsis Labs: Recent Labs  Lab 04/23/20 1219 04/24/20 0500  WBC 7.2 7.1    Microbiology Recent Results (from the past 240 hour(s))  SARS Coronavirus 2 by RT PCR (hospital order, performed in [facility] hospital lab) Nasopharyngeal Nasopharyngeal Swab     Status: None   Collection Time: 04/23/20  7:46 PM   Specimen: Nasopharyngeal Swab  Result Value Ref Range Status   SARS Coronavirus 2 NEGATIVE NEGATIVE Final    Comment: (NOTE) SARS-CoV-2 target nucleic acids are NOT DETECTED.  The SARS-CoV-2 RNA is generally detectable in upper and lower respiratory specimens during the acute phase of infection. The lowest concentration of SARS-CoV-2 viral copies this assay can detect is 250 copies / mL. A negative result does not preclude SARS-CoV-2 infection and should not be used as the sole basis for treatment or other patient management  decisions.  A negative result may occur with improper specimen collection / handling, submission of specimen other than nasopharyngeal swab, presence of viral mutation(s) within the areas targeted by this assay, and inadequate number of viral copies (<250 copies / mL). A negative result must be combined with clinical observations, patient history, and epidemiological information.  Fact Sheet for Patients:   StrictlyIdeas.no  Fact Sheet for Healthcare Providers: BankingDealers.co.za  This test is not yet approved or  cleared by the [place] FDA and has been authorized for detection  and/or diagnosis of SARS-CoV-2 by FDA under an Emergency Use Authorization (EUA).  This EUA will remain in effect (meaning this test can be used) for the duration of the COVID-19 declaration under Section 564(b)(1) of the Act, 21 U.S.C. section 360bbb-3(b)(1), unless the authorization is terminated or revoked sooner.  Performed at [facility], [place] [address], [place], [place] [address]   C Difficile Quick Screen w PCR reflex     Status: None   Collection Time: 04/24/20  4:55 AM   Specimen: Stool  Result Value Ref Range Status   C Diff antigen NEGATIVE NEGATIVE Final   C Diff toxin NEGATIVE NEGATIVE Final   C Diff interpretation No C. difficile detected.  Final    Comment: Performed at [facility], [place] [address] [place], [place] [address]    Procedures and diagnostic studies:  CT ABDOMEN PELVIS W CONTRAST  Result Date: 04/23/2020 CLINICAL DATA:  Abdominal pain, gastroenteritis EXAM: CT ABDOMEN AND PELVIS WITH CONTRAST TECHNIQUE: Multidetector CT imaging of the abdomen and pelvis was performed using the standard protocol following bolus administration of intravenous contrast. CONTRAST:  13mL OMNIPAQUE IOHEXOL 300 MG/ML  SOLN COMPARISON:  December 05, 2018 FINDINGS: Lower chest: The visualized heart size within normal limits. No pericardial  fluid/thickening. There appears to be mild wall thickening seen at the distal GE junction. The visualized portions of the lungs are clear. Hepatobiliary: The liver is normal in density without focal abnormality.The main portal vein is patent. The patient is status post cholecystectomy. No biliary ductal dilation. Pancreas: Unremarkable. No pancreatic ductal dilatation or surrounding inflammatory changes. Spleen: Normal in size without focal abnormality. Adrenals/Urinary Tract: Both adrenal glands appear normal. The kidneys and collecting system appear normal without evidence of urinary tract calculus or hydronephrosis. Bladder is unremarkable. Stomach/Bowel: The stomach, small bowel, and colon are normal in appearance. No inflammatory changes, wall thickening, or obstructive findings.The appendix is normal. Vascular/Lymphatic: There are no enlarged mesenteric, retroperitoneal, or pelvic lymph nodes. No significant vascular findings are present. Reproductive: The uterus and adnexa are unremarkable. Other: No evidence of abdominal wall mass or hernia. Musculoskeletal: No acute or significant osseous findings. IMPRESSION: Findings which could be suggestive of mild esophagitis. No other acute intra-abdominal or pelvic pathology to explain the patient's symptoms. Hepatic steatosis Electronically Signed   By: Prudencio Pair M.D.   On: 04/23/2020 22:44    Medications:   . folic acid  1 mg Oral Daily  . gabapentin  300 mg Oral QHS  . pantoprazole (PROTONIX) IV  40 mg Intravenous Q24H  . sodium chloride flush  10-40 mL Intracatheter Q12H   Continuous Infusions: . sodium chloride 75 mL/hr at 04/24/20 0700  . heparin 800 Units/hr (04/24/20 1022)  . magnesium sulfate bolus IVPB 2 g (04/24/20 1232)     LOS: 0 days   Geradine Girt  [facility]   How to contact the [facility] Attending or Consulting provider [place] or covering provider during after hours [name], for this patient?  1. Check the care team in  [facility] and look for a) attending/consulting [facility] provider listed and b) the [facility] team listed 2. Log into [email or web address] and use [place]'s universal password to access. If you do not have the password, please contact the hospital operator. 3. Locate the [facility] provider you are looking for under [facility] and page to a number that you can be directly reached. 4. If you still have difficulty reaching the provider, please page the  DOC (Director on Call) for the Hospitalists listed on amion for assistance.  04/24/2020, 12:55 PM

## 2020-04-24 NOTE — ED Notes (Signed)
Pt went to restroom and when she came back her IV had infiltrated. Pt said it was hurting. Took the old IV out and called IV team.

## 2020-04-24 NOTE — Consult Note (Addendum)
[facility] Consult: 10:06 AM 04/24/2020  LOS: 0 days    Referring Provider: Dr Mary [name]  Primary Care Physician:  Mary Blackbird, MD  [facility]  Primary Gastroenterologist:  Dr. Havery [name]     Reason for Consultation:  Anemia.  N/V/D.     HPI: Mary [name] is a 59 y.o. female.  Hx diastolic heart failure.  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  Chronic Coumadin.  Macrocytic anemia, required 1 PRBC for Hgb 6.9 on 12/31/19, anemia panel not preformed but she has listed history of B12 deficiency.  Also transfused 2 PRBCs in 2014.    Bleeding per rectum in 2018.  Never followed up with Dr. Watt Climes at the office after hospital consult. Office consultation on 01/09/2020 with PA -C Lemmon for anemia.   Stool path PCR negative 4/4/2, and she tends to have chronically loose stools and gets very excited if she has any sort of formed BM. No black, bloody stools. Normally has a good appetite with no dysphagia, no reflux symptoms. EGD and colonoscopy were set up for early May but she canceled.  Said she could not afford the prep and did not have transportation for the procedure.  Dr. Doyne Keel office was willing to modify to a more affordable prep but patient never reached back to GI.  She is in the ED now awaiting bed placement For almost a week she has had anorexia to start with, then on Monday developed nausea and bilious or clear emesis. Yellow-colored watery stools up to 7 or 8 per 24 hours, compared with usual 3-4 per 24 hours. Some peri-evacuation abdominal cramping which resolves after BM bowel, no significant persistent abdominal pain. + Dizziness/lightheadedness/weakness. Heart rate to the 120s initially, blood pressure as low as 91/65. Blood pressure improved. Currently 132/76 with heart  rate 102. No fevers.  Hgb 7.2.  MCV 121. Hgb was 9.9 on 01/22/20.  No transfusions ordered.   Na 127 >> 129. BUN normal. She is folate deficient but iron and B12 levels normal. Low TIBC and iron saturation. Ferritin 186. C diff is negative.    Social history Patient lives alone. She is in the process of trying to obtain medical disability but right now relies on local agencies for financial support. She has difficulty affording any medications though it is listed that she has Medicaid. She drinks a glass of white wine infrequently. She says a bottle of wine would last her at least a couple of months.  Past Medical History:  Diagnosis Date  . Acute diastolic heart failure ([place]) 03/15/2013  . Anemia   . B12 deficiency   . Carotid stenosis    Carotid US 2/17: bilateral ICA 1-39% >> FU prn  . Childhood asthma   . Complete heart block ([place])   . Epigastric hernia 200's  . GERD (gastroesophageal reflux disease)   . Heart murmur   . History of blood transfusion    "14 w/1st pregnancy; 2 w/last C-section" (03/15/2013)  . Hx of echocardiogram    a. Echo 4/16:  Mild focal  basal septal hypertrophy, EF 60-65%, no RWMA, Mechanical MVR ok with mild central regurgitation and no perivalvular leak, small mobile density attached to valvular ring (1.5x1 cm) - post surgical changes vs SBE, mild LAE;   b. Echo 2/17:  Mild post wall LVH, EF 55-60%, no RWMA, mechanical MVR ok (mean 5 mmHg), normal RVSF  . Hypertension    no pcp   will go to [facility]  . Macrocytic anemia   . 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 S/P mitral valve replacement with metallic valve 4/70/9628   35mm Sorin Carbomedics mechanical prosthesis via right mini thoracotomy approach  . 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 Severe mitral  regurgitation 04/22/2013  . Sinus tachycardia   . SVT (supraventricular tachycardia) ([place]) 03/16/2013  . TIA (transient ischemic attack)   . Tricuspid regurgitation     Past Surgical History:  Procedure Laterality Date  . APPENDECTOMY  [id number]  . CARDIAC CATHETERIZATION    . CARDIAC VALVE REPLACEMENT    . CESAREAN SECTION  1983  . CESAREAN SECTION WITH BILATERAL TUBAL LIGATION  1999  . FEMORAL HERNIA REPAIR Right 05/23/2013   Procedure: HERNIA REPAIR FEMORAL;  Surgeon: Rexene Alberts, MD;  Location: [place];  Service: Open Heart Surgery;  Laterality: Right;  . INTRAOPERATIVE TRANSESOPHAGEAL ECHOCARDIOGRAM N/A 05/23/2013   Procedure: INTRAOPERATIVE TRANSESOPHAGEAL ECHOCARDIOGRAM;  Surgeon: Rexene Alberts, MD;  Location: [place];  Service: Open Heart Surgery;  Laterality: N/A;  . LAPAROSCOPIC CHOLECYSTECTOMY  2003  . LEFT AND RIGHT HEART CATHETERIZATION WITH CORONARY ANGIOGRAM N/A 03/22/2013   Procedure: LEFT AND RIGHT HEART CATHETERIZATION WITH CORONARY ANGIOGRAM;  Surgeon: Burnell Blanks, MD;  Location[facility];  Service: Cardiovascular;  Laterality: N/A;  . MINIMALLY INVASIVE TRICUSPID VALVE REPAIR Right 05/23/2013   Procedure: MINIMALLY INVASIVE TRICUSPID VALVE REPAIR;  Surgeon: Rexene Alberts, MD;  Location: [place];  Service: Open Heart Surgery;  Laterality: Right;  . MITRAL VALVE REPLACEMENT N/A 05/23/2013   Procedure: MITRAL VALVE (MV) REPLACEMENT;  Surgeon: Rexene Alberts, MD;  Location: [place];  Service: Open Heart Surgery;  Laterality: N/A;  . MULTIPLE EXTRACTIONS WITH ALVEOLOPLASTY N/A 04/04/2013   Procedure: Extraction of tooth #'s 1,8,9,13,14,15,23,24,25,26 with alveoloplasty and gross debridement of remaining teeth;  Surgeon: Lenn Cal, DDS;  Location: [place];  Service: Oral Surgery;  Laterality: N/A;  . TEE WITHOUT CARDIOVERSION N/A 03/18/2013   Procedure: TRANSESOPHAGEAL ECHOCARDIOGRAM (TEE);  Surgeon: Larey Dresser, MD;  Location: [place];  Service:  Cardiovascular;  Laterality: N/A;  . TEE WITHOUT CARDIOVERSION N/A 06/17/2013   Procedure: TRANSESOPHAGEAL ECHOCARDIOGRAM (TEE);  Surgeon: Lelon Perla, MD;  Location: [place];  Service: Cardiovascular;  Laterality: N/A;  . [place]    Prior to Admission medications   Medication Sig Start Date End Date Taking? Authorizing Provider  acetaminophen (TYLENOL) 500 MG tablet Take 1,000 mg by mouth every 6 (six) hours as needed for headache.   Yes [provider]  aspirin EC 81 MG tablet Take 1 tablet (81 mg total) by mouth daily. 08/26/13  Yes Larey Dresser, MD  gabapentin (NEURONTIN) 300 MG capsule Take 1 capsule (300 mg total) by mouth at bedtime. 02/11/20  Yes Minette Brine, Amy J, NP  Hyprom-Naphaz-Polysorb-Zn Sulf (CLEAR EYES COMPLETE OP) Place 2 drops into both eyes daily as needed (red eyes).  Yes [provider]  warfarin (COUMADIN) 5 MG tablet Take 1.5 tablets Mon, Wed, Fri and 1 tablet all other days of the week or as directed by Coumadin clinic. Patient taking differently: Take 5-7.5 mg by mouth See admin instructions. Take 7.5mg  tablet by mouth on  Mon, Wed, Fri., then take 5 mg tablet by mouth all other days of the week or as directed by Coumadin clinic. 03/16/20  Yes Jerline Pain, MD  enoxaparin (LOVENOX) 60 MG/0.6ML injection Inject 0.6 mLs (60 mg total) into the skin every 12 (twelve) hours. Patient not taking: Reported on 04/23/2020 01/22/20   Jerline Pain, MD  esomeprazole (NEXIUM) 40 MG capsule Take 30-60 minutes before breakfast Patient not taking: Reported on 04/23/2020 01/09/20   Levin Erp, PA    Scheduled Meds: . gabapentin  300 mg Oral QHS  . pantoprazole (PROTONIX) IV  40 mg Intravenous Q24H  . sodium chloride flush  10-40 mL Intracatheter Q12H   Infusions: . sodium chloride 75 mL/hr at 04/24/20 0700  . heparin 800 Units/hr (04/24/20 0030)   PRN Meds: acetaminophen **OR** acetaminophen, ondansetron **OR** ondansetron  (ZOFRAN) IV, sodium chloride flush   Allergies as of 04/23/2020 - Review Complete 04/23/2020  Allergen Reaction Noted  . Oxycodone Itching 04/15/2013    Family History  Problem Relation Age of Onset  . Stroke Father   . Cancer Father   . Sarcoidosis Sister   . Heart attack Neg Hx   . Colon cancer Neg Hx   . Stomach cancer Neg Hx   . Pancreatic cancer Neg Hx   . Esophageal cancer Neg Hx     Social History   Socioeconomic History  . Marital status: Single    Spouse name: Not on file  . Number of children: Not on file  . Years of education: Not on file  . Highest education level: Not on file  Occupational History  . Not on file  Tobacco Use  . Smoking status: Former Smoker    Packs/day: 0.50    Years: 36.00    Pack years: 18.00    Types: Cigarettes    Quit date: 03/15/2013    Years since quitting: 7.1  . Smokeless tobacco: Never Used  Vaping Use  . Vaping Use: Never used  Substance and Sexual Activity  . Alcohol use: Yes    Comment:  "glass of wine"  . Drug use: No  . Sexual activity: Never  Other Topics Concern  . Not on file  Social History Narrative  . Not on file   Social Determinants of Health   Financial Resource Strain:   . Difficulty of Paying Living Expenses:   Food Insecurity:   . Worried About Charity fundraiser in the Last Year:   . Arboriculturist in the Last Year:   Transportation Needs:   . Film/video editor (Medical):   [name] Lack of Transportation (Non-Medical):   Physical Activity:   . Days of Exercise per Week:   . Minutes of Exercise per Session:   Stress:   . Feeling of Stress :   Social Connections:   . Frequency of Communication with Friends and Family:   . Frequency of Social Gatherings with Friends and Family:   . Attends Religious Services:   . Active Member of Clubs or Organizations:   . Attends Archivist Meetings:   [name] Marital Status:   Intimate Partner Violence:   . Fear of Current or Ex-Partner:   .  Emotionally Abused:   [name] Physically Abused:   . Sexually Abused:     REVIEW OF SYSTEMS: Constitutional: See HPI ENT:  No nose bleeds. History of multiple dental extractions prior to her valve replacement surgery Pulm: No shortness of breath, no cough. CV:  No palpitations, no LE edema.  GU:  No hematuria, no frequency GI: See HPI. Heme: No unusual or excessive bleeding or bruising Transfusions: None Neuro: See HPI. No headaches, no peripheral tingling or numbness Derm:  No itching, no rash or sores.  Endocrine:  No sweats or chills.  No polyuria or dysuria Immunization: Has not been vaccinated for COVID-19. Travel:  None beyond local counties in last few months.    PHYSICAL EXAM: Vital signs in last 24 hours: Vitals:   04/24/20 0700 04/24/20 0733  BP: (!) 95/59 (!) 109/89  Pulse: 100   Resp: 13   Temp:    SpO2: 96%    Wt Readings from Last 3 Encounters:  04/23/20 49.9 kg  02/11/20 53.3 kg  01/22/20 52.2 kg    General: Patient looks a bit pale. She sitting in bed comfortably alert.  Head: Slightly cushingoid facies. No facial asymmetry. No signs of head trauma. Eyes: No scleral icterus. No conjunctival pallor. EOMI Ears: Not hard of hearing Nose: No congestion or discharge Mouth: Oropharynx moist, pink, clear. Tongue midline. Only a few of her teeth remain. Neck: No JVD, masses, thyromegaly, or bruits Lungs: Clear bilaterally. Occasional dry cough. Heart: RRR. Soft valve snap audible. S1, S2 present. Abdomen: Soft, nontender, nondistended. No masses, bruits, hernias, HSM. Bowel sounds active.   Rectal: Not performed Musc/Skeltl: No joint redness, swelling or gross deformity Extremities: No CCE. Neurologic: Alert. Oriented x3. Moves all 4 limbs without weakness. No tremors. Skin: Pale. No telangiectasia, sores or suspicious lesions. No rash Nodes: No cervical adenopathy Psych: Slightly anxious but pleasant, cooperative. Good historian.  Intake/Output from previous  day: 07/29 0701 - 07/30 0700 In: 600 [IV Piggyback:600] Out: -  Intake/Output this shift: No intake/output data recorded.  LAB RESULTS: Recent Labs    04/23/20 1219 04/24/20 0500  WBC 7.2 7.1  HGB 8.9* 7.2*  HCT 23.8* 19.5*  PLT 337 312   BMET Lab Results  Component Value Date   NA 129 (L) 04/24/2020   NA 127 (L) 04/23/2020   NA 141 01/01/2020   K 4.7 04/24/2020   K 3.1 (L) 04/23/2020   K 3.6 01/01/2020   CL 94 (L) 04/24/2020   CL 88 (L) 04/23/2020   CL 117 (H) 01/01/2020   CO2 24 04/24/2020   CO2 24 04/23/2020   CO2 15 (L) 01/01/2020   GLUCOSE 97 04/24/2020   GLUCOSE 80 04/23/2020   GLUCOSE 84 01/01/2020   BUN 10 04/24/2020   BUN 9 04/23/2020   BUN 6 01/01/2020   CREATININE 1.72 (H) 04/24/2020   CREATININE 1.17 (H) 04/23/2020   CREATININE 0.79 01/01/2020   CALCIUM 8.0 (L) 04/24/2020   CALCIUM 8.6 (L) 04/23/2020   CALCIUM 7.6 (L) 01/01/2020   LFT Recent Labs    04/23/20 1219 04/24/20 0500  PROT 6.2* 5.4*  ALBUMIN 3.0* 2.6*  AST 45* 36  ALT 23 18  ALKPHOS 138* 123  BILITOT 0.9 0.8   PT/INR Lab Results  Component Value Date   INR 2.0 (H) 04/24/2020   INR 2.0 (H) 04/23/2020   INR 3.4 (A) 03/05/2020   Hepatitis Panel No results for input(s): HEPBSAG, HCVAB, HEPAIGM, HEPBIGM in the last 72 hours. C-Diff No components  found for: CDIFF Lipase     Component Value Date/Time   LIPASE 15 04/23/2020 1219    Drugs of Abuse     Component Value Date/Time   LABOPIA NONE DETECTED 12/30/2019 1704   COCAINSCRNUR NONE DETECTED 12/30/2019 1704   LABBENZ NONE DETECTED 12/30/2019 1704   AMPHETMU NONE DETECTED 12/30/2019 1704   THCU POSITIVE (A) 12/30/2019 1704   LABBARB NONE DETECTED 12/30/2019 1704     RADIOLOGY STUDIES: CT ABDOMEN PELVIS W CONTRAST  Result Date: 04/23/2020 CLINICAL DATA:  Abdominal pain, gastroenteritis EXAM: CT ABDOMEN AND PELVIS WITH CONTRAST TECHNIQUE: Multidetector CT imaging of the abdomen and pelvis was performed using the  standard protocol following bolus administration of intravenous contrast. CONTRAST:  74mL OMNIPAQUE IOHEXOL 300 MG/ML  SOLN COMPARISON:  December 05, 2018 FINDINGS: Lower chest: The visualized heart size within normal limits. No pericardial fluid/thickening. There appears to be mild wall thickening seen at the distal GE junction. The visualized portions of the lungs are clear. Hepatobiliary: The liver is normal in density without focal abnormality.The main portal vein is patent. The patient is status post cholecystectomy. No biliary ductal dilation. Pancreas: Unremarkable. No pancreatic ductal dilatation or surrounding inflammatory changes. Spleen: Normal in size without focal abnormality. Adrenals/Urinary Tract: Both adrenal glands appear normal. The kidneys and collecting system appear normal without evidence of urinary tract calculus or hydronephrosis. Bladder is unremarkable. Stomach/Bowel: The stomach, small bowel, and colon are normal in appearance. No inflammatory changes, wall thickening, or obstructive findings.The appendix is normal. Vascular/Lymphatic: There are no enlarged mesenteric, retroperitoneal, or pelvic lymph nodes. No significant vascular findings are present. Reproductive: The uterus and adnexa are unremarkable. Other: No evidence of abdominal wall mass or hernia. Musculoskeletal: No acute or significant osseous findings. IMPRESSION: Findings which could be suggestive of mild esophagitis. No other acute intra-abdominal or pelvic pathology to explain the patient's symptoms. Hepatic steatosis Electronically Signed   By: Prudencio Pair M.D.   On: 04/23/2020 22:44     IMPRESSION:     *    4 days of nonbloody nausea, vomiting and worsening of chronic loose stools. Patient counseled EGD and colonoscopy set up for evaluation of anemia set for early May  *   Folate deficient, macrocytic anemia. She has listed history of B12 deficiency but not currently B12 deficient. FOBT   *   Mechanical MVR,  tricuspid valve repair.  Chronic Coumadin.  INR 2 PO, . Sthis is subtherapeutic as her targeted range is 2.5-3.5.he has been able to keep her Coumadin down yesterday but the day before she vomited up the medication.  *   Hyponatremia.    PLAN:     *   EGD and colonoscopy.   At present patient does not think she will be able to complete her prep this evening so will recheck patient tomorrow and hopefully be able to proceed with colonoscopy on Sunday with bowel prep Saturday/tomorrow night. I suspect her INR will be at or close to 1.8 by Sunday morning. Continue Protonix 40 mg IV/24 hours, as needed antiemetics. Daily a.m. PT/INR.  Start Heparin as doing, GI will hold this for several hours before procedures.     Azucena Freed  04/24/2020, 10:06 AM Phone [phone]

## 2020-04-24 NOTE — Progress Notes (Signed)
[facility] for Heparin (warfarin on hold) Indication: Mechanical MVR  Allergies  Allergen Reactions  . Oxycodone Itching    Patient Measurements: ~50 kg  Vital Signs: BP: 109/89 (07/30 0733) Pulse Rate: 100 (07/30 0700)  Labs: Recent Labs    04/23/20 1219 04/23/20 2250 04/24/20 0500 04/24/20 0914  HGB 8.9*  --  7.2*  --   HCT 23.8*  --  19.5*  --   PLT 337  --  312  --   LABPROT  --  22.0* 21.8*  --   INR  --  2.0* 2.0*  --   HEPARINUNFRC  --   --   --  0.16*  CREATININE 1.17*  --  1.72*  --     Estimated Creatinine Clearance: 28.4 mL/min (A) (by C-G formula based on SCr of 1.72 mg/dL (H)).  Assessment: 59 y/o F on warfarin PTA for hx of mechanical MVR, holding warfarin and starting heparin in case procedures are needed during hospital stay.  INR goal PTA is 2.5-3.5 and INR is 2 so heparin was started.   Initial heparin level is low but there were issues with the line and heparin was off for at least 20-30 minutes within an hour of the heparin level being drawn. Level is potentially inaccurate.   Goal of Therapy:  Heparin level 0.3-0.7 units/ml Monitor platelets by anticoagulation protocol: Yes   Plan:  Continue heparin gtt at 800 units/hr Recheck heparin level this afternoon  Daily heparin level and CBC  Salome Arnt, PharmD, BCPS Clinical Pharmacist Please see AMION for all pharmacy numbers 04/24/2020 10:05 AM

## 2020-04-24 NOTE — ED Notes (Signed)
IV team nurse at bedside. 

## 2020-04-25 ENCOUNTER — Encounter (HOSPITAL_COMMUNITY)
Admission: EM | Disposition: A | Discharge status: Home Health | Payer: Self-pay | Source: Home / Self Care | Attending: Internal Medicine

## 2020-04-25 ENCOUNTER — Inpatient Hospital Stay: Payer: Self-pay

## 2020-04-25 LAB — CBC
HCT: 21.2 % — ABNORMAL LOW (ref 36.0–46.0)
Hemoglobin: 7.8 g/dL — ABNORMAL LOW (ref 12.0–15.0)
MCH: 45.3 pg — ABNORMAL HIGH (ref 26.0–34.0)
MCHC: 36.8 g/dL — ABNORMAL HIGH (ref 30.0–36.0)
MCV: 123.3 fL — ABNORMAL HIGH (ref 80.0–100.0)
Platelets: 320 10*3/uL (ref 150–400)
RBC: 1.72 MIL/uL — ABNORMAL LOW (ref 3.87–5.11)
RDW: 14.8 % (ref 11.5–15.5)
WBC: 6.9 10*3/uL (ref 4.0–10.5)
nRBC: 0 % (ref 0.0–0.2)

## 2020-04-25 LAB — MAGNESIUM: Magnesium: 2.3 mg/dL (ref 1.7–2.4)

## 2020-04-25 LAB — BASIC METABOLIC PANEL
Anion gap: 15 (ref 5–15)
BUN: 7 mg/dL (ref 6–20)
CO2: 15 mmol/L — ABNORMAL LOW (ref 22–32)
Calcium: 8.6 mg/dL — ABNORMAL LOW (ref 8.9–10.3)
Chloride: 105 mmol/L (ref 98–111)
Creatinine, Ser: 1.52 mg/dL — ABNORMAL HIGH (ref 0.44–1.00)
GFR calc Af Amer: 44 mL/min — ABNORMAL LOW (ref >60)
GFR calc non Af Amer: 38 mL/min — ABNORMAL LOW (ref >60)
Glucose, Bld: 85 mg/dL (ref 70–99)
Potassium: 3.3 mmol/L — ABNORMAL LOW (ref 3.5–5.1)
Sodium: 135 mmol/L (ref 135–145)

## 2020-04-25 LAB — HEPARIN LEVEL (UNFRACTIONATED): Heparin Unfractionated: 0.32 IU/mL (ref 0.30–0.70)

## 2020-04-25 LAB — PROTIME-INR
INR: 2.1 — ABNORMAL HIGH (ref 0.8–1.2)
Prothrombin Time: 22.4 seconds — ABNORMAL HIGH (ref 11.4–15.2)

## 2020-04-25 SURGERY — CANCELLED PROCEDURE

## 2020-04-25 MED ORDER — SODIUM CHLORIDE 0.9% FLUSH
10.0000 mL | INTRAVENOUS | Status: DC | PRN
  Administered 2020-04-27: 30 mL
  Administered 2020-04-28: 10 mL

## 2020-04-25 MED ORDER — CHLORHEXIDINE GLUCONATE CLOTH 2 % EX PADS
6.0000 | MEDICATED_PAD | Freq: Every day | CUTANEOUS | Status: DC
  Administered 2020-04-25 – 2020-05-03 (×6): 6 via TOPICAL

## 2020-04-25 MED ORDER — POTASSIUM CHLORIDE IN NACL 20-0.9 MEQ/L-% IV SOLN
INTRAVENOUS | Status: AC
  Filled 2020-04-25: qty 1000

## 2020-04-25 MED ORDER — SODIUM CHLORIDE 0.9% FLUSH
10.0000 mL | Freq: Two times a day (BID) | INTRAVENOUS | Status: DC
  Administered 2020-04-25 – 2020-05-04 (×14): 10 mL

## 2020-04-25 MED ORDER — HEPARIN (PORCINE) 25000 UT/250ML-% IV SOLN
800.0000 [IU]/h | INTRAVENOUS | Status: AC
  Administered 2020-04-25: 800 [IU]/h via INTRAVENOUS

## 2020-04-25 SURGICAL SUPPLY — 25 items

## 2020-04-25 NOTE — Progress Notes (Signed)
Progress Note    Mary [name]  GEX:[id number] DOB: 10/26/1960  DOA: 04/23/2020 PCP: Antony Blackbird, MD    Brief Narrative:     Medical records reviewed and are as summarized below:  Mary [name] is an 59 y.o. female with a past medical history of mitral valve replacement on warfarin, history of macrocytic anemia, history of GERD who was in her usual state of health till about 4 to 5 days ago when she started developing nausea followed by onset of vomiting and diarrhea.  She denies any blood in the emesis or in the stool.  She has had profuse amount of watery stool.  Denies any recent antibiotic use.  Denies eating out anywhere recently.  She does have some abdominal discomfort associated with the symptoms.  Denies any fever or chills.  Denies any dysuria.  She does admit to acid reflux symptoms.  She had similar presentation back in April when she had to be hospitalized for dehydration.  She has never had upper endoscopy or colonoscopy.  It appears that these procedures were planned in May however patient could not find transportation and could not afford the medications needed for prep.  Assessment/Plan:   Principal Problem:   Acute gastroenteritis Active Problems:   Warfarin anticoagulation   History of rheumatic heart disease   Hyponatremia   Dehydration   Macrocytic anemia   N&V (nausea and vomiting)   H/O mitral valve replacement   Diarrhea   Acute gastroenteritis:  -GI consult for endoscopies planned for 7/31 -symptomatic treatment  Hyponatremia and hypokalemia/dehydration:  -setting of GI loss -repleted and rechecked -IVF for now  AKI -gentle IVF -monitor with lab recheck in AM  History of rheumatic heart disease with history of mitral valve replacement with mechanical valve: Followed by Dr. Marlou Porch with cardiology.  She is on warfarin for anticoagulation. Goal 2.5-3.5 -IV heparin with plan to bridge back to coumadin post procedure -let INR drift down  naturally  macrocytic anemia:  -folic acid low- replace PO -transfuse for <7  ? Alcohol use (in past has reported PRN wine consumption) -monitor for withdrawal   Family Communication/Anticipated D/C date and plan/Code Status   DVT prophylaxis: heparin gtt Code Status: Full Code.  Disposition Plan: Status is: Inpatient  Remains inpatient appropriate because:IV treatments appropriate due to intensity of illness or inability to take PO   Dispo: The patient is from: Home              Anticipated d/c is to: Home              Anticipated d/c date is: > 3 days              Patient currently is not medically stable to d/c. after procedures, will need bridge back to INR of 2.5-3.5         Medical Consultants:    GI  Subjective:   Did not sleep last night as she was on the commode  Objective:    Vitals:   04/24/20 1126 04/24/20 1500 04/25/20 0025 04/25/20 0358  BP: (!) 132/76 (!) 131/79 123/78 114/76  Pulse: 102 99 97 96  Resp: 14 15 16 17   Temp: 98 F (36.7 C) 98.6 F (37 C) 97.7 F (36.5 C) 97.9 F (36.6 C)  TempSrc: Oral Oral Oral Oral  SpO2: 100% 98%      Intake/Output Summary (Last 24 hours) at 04/25/2020 1015 Last data filed at 04/25/2020 0600 Gross per 24 hour  Intake 3082.16 ml  Output 250 ml  Net 2832.16 ml   There were no vitals filed for this visit.  Exam:  General: Appearance:    Well developed, well nourished female in no acute distress     Lungs:     Clear to auscultation bilaterally, respirations unlabored  Heart:    Normal heart rate. Normal rhythm.    MS:   All extremities are intact.   Neurologic:   Sleepy but will awaken      Data Reviewed:   I have personally reviewed following labs and imaging studies:  Labs: Labs show the following:   Basic Metabolic Panel: Recent Labs  Lab 04/23/20 1219 04/23/20 1219 04/24/20 0500 04/25/20 0143  NA 127*  --  129* 135  K 3.1*   < > 4.7 3.3*  CL 88*  --  94* 105  CO2 24  --  24  15*  GLUCOSE 80  --  97 85  BUN 9  --  10 7  CREATININE 1.17*  --  1.72* 1.52*  CALCIUM 8.6*  --  8.0* 8.6*  MG  --   --  1.5* 2.3   < > = values in this interval not displayed.   GFR Estimated Creatinine Clearance: 32.2 mL/min (A) (by C-G formula based on SCr of 1.52 mg/dL (H)). Liver Function Tests: Recent Labs  Lab 04/23/20 1219 04/24/20 0500  AST 45* 36  ALT 23 18  ALKPHOS 138* 123  BILITOT 0.9 0.8  PROT 6.2* 5.4*  ALBUMIN 3.0* 2.6*   Recent Labs  Lab 04/23/20 1219  LIPASE 15   No results for input(s): AMMONIA in the last 168 hours. Coagulation profile Recent Labs  Lab 04/23/20 2250 04/24/20 0500 04/25/20 0143  INR 2.0* 2.0* 2.1*    CBC: Recent Labs  Lab 04/23/20 1219 04/24/20 0500 04/25/20 0143  WBC 7.2 7.1 6.9  HGB 8.9* 7.2* 7.8*  HCT 23.8* 19.5* 21.2*  MCV 120.2* 121.9* 123.3*  PLT 337 312 320   Cardiac Enzymes: No results for input(s): CKTOTAL, CKMB, CKMBINDEX, TROPONINI in the last 168 hours. BNP (last 3 results) No results for input(s): PROBNP in the last 8760 hours. CBG: No results for input(s): GLUCAP in the last 168 hours. D-Dimer: No results for input(s): DDIMER in the last 72 hours. Hgb A1c: No results for input(s): HGBA1C in the last 72 hours. Lipid Profile: No results for input(s): CHOL, HDL, LDLCALC, TRIG, CHOLHDL, LDLDIRECT in the last 72 hours. Thyroid function studies: No results for input(s): TSH, T4TOTAL, T3FREE, THYROIDAB in the last 72 hours.  Invalid input(s): FREET3 Anemia work up: Recent Labs    04/24/20 0500  VITAMINB12 376  FOLATE 3.7*  FERRITIN 186  TIBC 193*  IRON 168  RETICCTPCT 1.6   Sepsis Labs: Recent Labs  Lab 04/23/20 1219 04/24/20 0500 04/25/20 0143  WBC 7.2 7.1 6.9    Microbiology Recent Results (from the past 240 hour(s))  SARS Coronavirus 2 by RT PCR (hospital order, performed in [facility] hospital lab) Nasopharyngeal Nasopharyngeal Swab     Status: None   Collection Time: 04/23/20   7:46 PM   Specimen: Nasopharyngeal Swab  Result Value Ref Range Status   SARS Coronavirus 2 NEGATIVE NEGATIVE Final    Comment: (NOTE) SARS-CoV-2 target nucleic acids are NOT DETECTED.  The SARS-CoV-2 RNA is generally detectable in upper and lower respiratory specimens during the acute phase of infection. The lowest concentration of SARS-CoV-2 viral copies this assay can detect is 250 copies /  mL. A negative result does not preclude SARS-CoV-2 infection and should not be used as the sole basis for treatment or other patient management decisions.  A negative result may occur with improper specimen collection / handling, submission of specimen other than nasopharyngeal swab, presence of viral mutation(s) within the areas targeted by this assay, and inadequate number of viral copies (<250 copies / mL). A negative result must be combined with clinical observations, patient history, and epidemiological information.  Fact Sheet for Patients:   StrictlyIdeas.no  Fact Sheet for Healthcare Providers: BankingDealers.co.za  This test is not yet approved or  cleared by the [place] FDA and has been authorized for detection and/or diagnosis of SARS-CoV-2 by FDA under an Emergency Use Authorization (EUA).  This EUA will remain in effect (meaning this test can be used) for the duration of the COVID-19 declaration under Section 564(b)(1) of the Act, 21 U.S.C. section 360bbb-3(b)(1), unless the authorization is terminated or revoked sooner.  Performed at [facility], [place] [address] [place], [place] [address]   Gastrointestinal Panel by PCR , Stool     Status: None   Collection Time: 04/24/20  4:55 AM   Specimen: Stool  Result Value Ref Range Status   Campylobacter species NOT DETECTED NOT DETECTED Final   Plesimonas shigelloides NOT DETECTED NOT DETECTED Final   Salmonella species NOT DETECTED NOT DETECTED Final   Yersinia  enterocolitica NOT DETECTED NOT DETECTED Final   Vibrio species NOT DETECTED NOT DETECTED Final   Vibrio cholerae NOT DETECTED NOT DETECTED Final   Enteroaggregative E coli (EAEC) NOT DETECTED NOT DETECTED Final   Enteropathogenic E coli (EPEC) NOT DETECTED NOT DETECTED Final   Enterotoxigenic E coli (ETEC) NOT DETECTED NOT DETECTED Final   Shiga like toxin producing E coli (STEC) NOT DETECTED NOT DETECTED Final   Shigella/Enteroinvasive E coli (EIEC) NOT DETECTED NOT DETECTED Final   Cryptosporidium NOT DETECTED NOT DETECTED Final   Cyclospora cayetanensis NOT DETECTED NOT DETECTED Final   Entamoeba histolytica NOT DETECTED NOT DETECTED Final   Giardia lamblia NOT DETECTED NOT DETECTED Final   Adenovirus F40/41 NOT DETECTED NOT DETECTED Final   Astrovirus NOT DETECTED NOT DETECTED Final   Norovirus GI/GII NOT DETECTED NOT DETECTED Final   Rotavirus A NOT DETECTED NOT DETECTED Final   Sapovirus (I, II, IV, and V) NOT DETECTED NOT DETECTED Final    Comment: Performed at [facility], [address] [place], [place] [address]  C Difficile Quick Screen w PCR reflex     Status: None   Collection Time: 04/24/20  4:55 AM   Specimen: Stool  Result Value Ref Range Status   C Diff antigen NEGATIVE NEGATIVE Final   C Diff toxin NEGATIVE NEGATIVE Final   C Diff interpretation No C. difficile detected.  Final    Comment: Performed at [facility], [place] [address], [place], [place] [address]    Procedures and diagnostic studies:  CT ABDOMEN PELVIS W CONTRAST  Result Date: 04/23/2020 CLINICAL DATA:  Abdominal pain, gastroenteritis EXAM: CT ABDOMEN AND PELVIS WITH CONTRAST TECHNIQUE: Multidetector CT imaging of the abdomen and pelvis was performed using the standard protocol following bolus administration of intravenous contrast. CONTRAST:  32mL OMNIPAQUE IOHEXOL 300 MG/ML  SOLN COMPARISON:  December 05, 2018 FINDINGS: Lower chest: The visualized heart size within normal limits. No  pericardial fluid/thickening. There appears to be mild wall thickening seen at the distal GE junction. The visualized portions of the lungs are clear. Hepatobiliary:  The liver is normal in density without focal abnormality.The main portal vein is patent. The patient is status post cholecystectomy. No biliary ductal dilation. Pancreas: Unremarkable. No pancreatic ductal dilatation or surrounding inflammatory changes. Spleen: Normal in size without focal abnormality. Adrenals/Urinary Tract: Both adrenal glands appear normal. The kidneys and collecting system appear normal without evidence of urinary tract calculus or hydronephrosis. Bladder is unremarkable. Stomach/Bowel: The stomach, small bowel, and colon are normal in appearance. No inflammatory changes, wall thickening, or obstructive findings.The appendix is normal. Vascular/Lymphatic: There are no enlarged mesenteric, retroperitoneal, or pelvic lymph nodes. No significant vascular findings are present. Reproductive: The uterus and adnexa are unremarkable. Other: No evidence of abdominal wall mass or hernia. Musculoskeletal: No acute or significant osseous findings. IMPRESSION: Findings which could be suggestive of mild esophagitis. No other acute intra-abdominal or pelvic pathology to explain the patient's symptoms. Hepatic steatosis Electronically Signed   By: Prudencio Pair M.D.   On: 04/23/2020 22:44   [place] EKG SITE RITE  Result Date: 04/25/2020 If Site Rite image not attached, placement could not be confirmed due to current cardiac rhythm.   Medications:   . [MAR Hold] folic acid  1 mg Oral Daily  . [MAR Hold] gabapentin  300 mg Oral QHS  . [MAR Hold] pantoprazole (PROTONIX) IV  40 mg Intravenous Q24H  . [MAR Hold] sodium chloride flush  10-40 mL Intracatheter Q12H   Continuous Infusions: . 0.9 % NaCl with KCl 20 mEq / L       LOS: 1 day   Geradine Girt  [facility]   How to contact the [facility] Attending or Consulting provider Reserve  or covering provider during after hours [place], for this patient?  1. Check the care team in [facility] and look for a) attending/consulting [facility] provider listed and b) the [facility] team listed 2. Log into [email or web address] and use [place]'s universal password to access. If you do not have the password, please contact the hospital operator. 3. Locate the [facility] provider you are looking for under [facility] and page to a number that you can be directly reached. 4. If you still have difficulty reaching the provider, please page the [facility] (Director on Call) for the Hospitalists listed on amion for assistance.  04/25/2020, 10:15 AM

## 2020-04-25 NOTE — Progress Notes (Signed)
Secure chat with Dr. Eliseo Squires c/o PICC order.

## 2020-04-25 NOTE — Progress Notes (Signed)
Reviewed labs in pre-procedure for egd/colonoscopy, INR 2.1.  Per Dr. Cathleen Corti, procedure to be canceled for today and rescheduled tentatively for tomorrow pending INR.  Dr. Bryan Lemma spoke with pt in Endo.  Bedside nurse aware, ok to restart heparin.  Vista Lawman, RN

## 2020-04-25 NOTE — Progress Notes (Signed)
[place] for Heparin (warfarin on hold) Indication: Mechanical MVR  Allergies  Allergen Reactions   Oxycodone Itching    Patient Measurements: 49.9 kg (110 lb)  Vital Signs: Temp: 97.9 F (36.6 C) (07/31 0358) Temp Source: Oral (07/31 0358) BP: 114/76 (07/31 0358) Pulse Rate: 96 (07/31 0358)  Labs: Recent Labs    04/23/20 1219 04/23/20 1219 04/23/20 2250 04/24/20 0500 04/24/20 0914 04/24/20 1354 04/25/20 0143  HGB 8.9*   < >  --  7.2*  --   --  7.8*  HCT 23.8*  --   --  19.5*  --   --  21.2*  PLT 337  --   --  312  --   --  320  LABPROT  --   --  22.0* 21.8*  --   --  22.4*  INR  --   --  2.0* 2.0*  --   --  2.1*  HEPARINUNFRC  --   --   --   --  0.16* 0.45  --   CREATININE 1.17*  --   --  1.72*  --   --  1.52*   < > = values in this interval not displayed.    Estimated Creatinine Clearance: 32.2 mL/min (A) (by C-G formula based on SCr of 1.52 mg/dL (H)).  Assessment: 58 yo female on warfarin PTA for hx of mechanical MVR, holding warfarin and starting heparin in case procedures are needed during hospital stay. INR goal PTA is 2.5-3.5 and INR is 2 so heparin was started. Patient's PTA warfarin dose is 7.5 mg MWF and 5 mg on all other days of the week. Pharmacy is consulted to dose heparin.  Patient was previously on heparin IV and had a therapeutic level of 0.45 on 7/30 without any bleeding noted. Heparin drip was stopped 7/31 0400 due to a scheduled EGD w/ colonoscopy on 7/31. The procedure is now rescheduled for 8/1 due to an INR of 2.1. Gastroenterology will proceed with procedure when INR is less than 1.8. CBC is WNL, will resume heparin drip at the previous rate.  Goal of Therapy:  Heparin level 0.3-0.7 units/ml Monitor platelets by anticoagulation protocol: Yes   Plan:  Initiate heparin IV at 800 units/hr Stop heparin IV at 0500 on 8/1 prior to procedure per Dr. Bryan Lemma Obtain 6-hr heparin level and daily  CBC Bridge to warfarin post-procedure Monitor for signs and symptoms of bleeding  Shauna Hugh, PharmD, [place]  PGY-1 Pharmacy Resident 04/25/2020 10:52 AM  Please check [email or web address] for unit-specific pharmacy phone numbers.

## 2020-04-25 NOTE — Progress Notes (Signed)
Peripherally Inserted Central Catheter Placement  The IV Nurse has discussed with the patient and/or persons authorized to consent for the patient, the purpose of this procedure and the potential benefits and risks involved with this procedure.  The benefits include less needle sticks, lab draws from the catheter, and the patient may be discharged home with the catheter. Risks include, but not limited to, infection, bleeding, blood clot (thrombus formation), and puncture of an artery; nerve damage and irregular heartbeat and possibility to perform a PICC exchange if needed/ordered by physician.  Alternatives to this procedure were also discussed.  Bard Power PICC patient education guide, fact sheet on infection prevention and patient information card has been provided to patient /or left at bedside.    PICC Placement Documentation  PICC Single Lumen 04/25/20 PICC Right Brachial 36 cm 0 cm (Active)  Indication for Insertion or Continuance of Line Poor Vasculature-patient has had multiple peripheral attempts or PIVs lasting less than 24 hours 04/25/20 1200  Exposed Catheter (cm) 0 cm 04/25/20 1200  Site Assessment Clean;Intact;Dry 04/25/20 1200  Line Status Flushed;Blood return noted;Saline locked 04/25/20 1200  Dressing Type Transparent 04/25/20 1200  Dressing Status Clean;Dry;Intact;Antimicrobial disc in place 04/25/20 1200  Safety Lock Not Applicable 62/94/76 5465  Line Care Connections checked and tightened 04/25/20 1200  Line Adjustment (NICU/IV Team Only) No 04/25/20 1200  Dressing Intervention New dressing 04/25/20 1200  Dressing Change Due 05/02/20 04/25/20 [place], Keenan Bachelor 04/25/2020, 12:12 PM

## 2020-04-25 NOTE — Progress Notes (Signed)
[name] GA[facility] ROUNDING NOTE   Subjective: No acute events overnight.  Tolerated bowel prep without issue.  No overt GI blood loss.  Was scheduled for enteroscopy/colonoscopy this morning, but procedure canceled due to INR 2.1.  Objective: Vital signs in last 24 hours: Temp:  [97.7 F (36.5 C)-98.6 F (37 C)] 97.9 F (36.6 C) (07/31 0358) Pulse Rate:  [96-102] 96 (07/31 0358) Resp:  [14-17] 17 (07/31 0358) BP: (114-132)/(76-87) 114/76 (07/31 0358) SpO2:  [98 %-100 %] 98 % (07/30 1500) Last BM Date: 04/24/20 General: NAD Lungs:  CTA b/l, no w/r/r Heart:  RRR Abdomen:  Soft, NT, ND, +BS    Intake/Output from previous day: 07/30 0701 - 07/31 0700 In: 3082.2 [P.O.:1665; I.V.:1367.2; IV Piggyback:50] Out: 250 [Urine:250] Intake/Output this shift: No intake/output data recorded.   Lab Results: Recent Labs    04/23/20 1219 04/24/20 0500 04/25/20 0143  WBC 7.2 7.1 6.9  HGB 8.9* 7.2* 7.8*  PLT 337 312 320  MCV 120.2* 121.9* 123.3*   BMET Recent Labs    04/23/20 1219 04/24/20 0500 04/25/20 0143  NA 127* 129* 135  K 3.1* 4.7 3.3*  CL 88* 94* 105  CO2 24 24 15*  GLUCOSE 80 97 85  BUN 9 10 7   CREATININE 1.17* 1.72* 1.52*  CALCIUM 8.6* 8.0* 8.6*   LFT Recent Labs    04/23/20 1219 04/24/20 0500  PROT 6.2* 5.4*  ALBUMIN 3.0* 2.6*  AST 45* 36  ALT 23 18  ALKPHOS 138* 123  BILITOT 0.9 0.8   PT/INR Recent Labs    04/24/20 0500 04/25/20 0143  INR 2.0* 2.1*      Imaging/Other results: CT ABDOMEN PELVIS W CONTRAST  Result Date: 04/23/2020 CLINICAL DATA:  Abdominal pain, gastroenteritis EXAM: CT ABDOMEN AND PELVIS WITH CONTRAST TECHNIQUE: Multidetector CT imaging of the abdomen and pelvis was performed using the standard protocol following bolus administration of intravenous contrast. CONTRAST:  10mL OMNIPAQUE IOHEXOL 300 MG/ML  SOLN COMPARISON:  December 05, 2018 FINDINGS: Lower chest: The visualized heart size within normal limits. No pericardial  fluid/thickening. There appears to be mild wall thickening seen at the distal GE junction. The visualized portions of the lungs are clear. Hepatobiliary: The liver is normal in density without focal abnormality.The main portal vein is patent. The patient is status post cholecystectomy. No biliary ductal dilation. Pancreas: Unremarkable. No pancreatic ductal dilatation or surrounding inflammatory changes. Spleen: Normal in size without focal abnormality. Adrenals/Urinary Tract: Both adrenal glands appear normal. The kidneys and collecting system appear normal without evidence of urinary tract calculus or hydronephrosis. Bladder is unremarkable. Stomach/Bowel: The stomach, small bowel, and colon are normal in appearance. No inflammatory changes, wall thickening, or obstructive findings.The appendix is normal. Vascular/Lymphatic: There are no enlarged mesenteric, retroperitoneal, or pelvic lymph nodes. No significant vascular findings are present. Reproductive: The uterus and adnexa are unremarkable. Other: No evidence of abdominal wall mass or hernia. Musculoskeletal: No acute or significant osseous findings. IMPRESSION: Findings which could be suggestive of mild esophagitis. No other acute intra-abdominal or pelvic pathology to explain the patient's symptoms. Hepatic steatosis Electronically Signed   By: Prudencio Pair M.D.   On: 04/23/2020 22:44   [place] EKG SITE RITE  Result Date: 04/25/2020 If Site Rite image not attached, placement could not be confirmed due to current cardiac rhythm.     Assessment and Plan:  1) Nausea/Vomiting-controlled 2) Diarrhea 3) Macrocytic anemia 4) Folate deficiency 5) AKI-improving 6) Mechanical MVR 7) Chronic anticoagulation (subtherapeutic) 8) Hyponatremia-resolved 9) GEJ thickening  on CT  -Resume high-dose PPI -Antiemetics -GI PCR panel negative/normal.  C. difficile negative -Will need repeat iron panel at some point to reevaluate the very elevated saturation at  40% -Continue folic acid -IV fluids for AKI -Coumadin stopped.  Okay to resume heparin GTT now -STOP HEPARIN gtt 4 hours prior to procedure tomorrow morning (this was not done this morning) -Okay to resume clears today, with n.p.o. at midnight -Plan for EGD/push enteroscopy and colonoscopy tomorrow for diagnostic and therapeutic intent -INR check tomorrow a.m.  Plan to proceed with endoscopic procedures when <1.8 to allow appropriate diagnostic and therapeutic interventions    Lavena Bullion, DO  04/25/2020, 10:28 AM [facility] Pager [phone]

## 2020-04-25 NOTE — Progress Notes (Signed)
[place] for Heparin (warfarin on hold) Indication: Mechanical MVR  Allergies  Allergen Reactions  . Oxycodone Itching    Patient Measurements: 49.9 kg (110 lb)  Vital Signs: Temp: 98 F (36.7 C) (07/31 1756) Temp Source: Oral (07/31 1756) BP: 110/52 (07/31 1756) Pulse Rate: 110 (07/31 1756)  Labs: Recent Labs    04/23/20 1219 04/23/20 1219 04/23/20 2250 04/24/20 0500 04/24/20 0914 04/24/20 1354 04/25/20 0143 04/25/20 2120  HGB 8.9*   < >  --  7.2*  --   --  7.8*  --   HCT 23.8*  --   --  19.5*  --   --  21.2*  --   PLT 337  --   --  312  --   --  320  --   LABPROT  --   --  22.0* 21.8*  --   --  22.4*  --   INR  --   --  2.0* 2.0*  --   --  2.1*  --   HEPARINUNFRC  --   --   --   --  0.16* 0.45  --  0.32  CREATININE 1.17*  --   --  1.72*  --   --  1.52*  --    < > = values in this interval not displayed.    Estimated Creatinine Clearance: 32.2 mL/min (A) (by C-G formula based on SCr of 1.52 mg/dL (H)).  Assessment: 59 yo female on warfarin PTA for hx of mechanical MVR, holding warfarin and starting heparin in case procedures are needed during hospital stay. INR goal PTA is 2.5-3.5 and INR is 2 so heparin was started. Patient's PTA warfarin dose is 7.5 mg MWF and 5 mg on all other days of the week. Pharmacy is consulted to dose heparin.  Patient was previously on heparin IV and had a therapeutic level of 0.45 on 7/30 without any bleeding noted. Heparin drip was stopped 7/31 0400 due to a scheduled EGD w/ colonoscopy on 7/31. The procedure is now rescheduled for 8/1 due to an INR of 2.1. Gastroenterology will proceed with procedure when INR is less than 1.8. CBC is WNL, will resume heparin drip at the previous rate.  7/31 PM update:  Heparin level therapeutic   Goal of Therapy:  Heparin level 0.3-0.7 units/ml Monitor platelets by anticoagulation protocol: Yes   Plan:  Cont heparin at 800 units/hr Heparin off 8/1 AM 0500  for procedure F/U anti-coagulation plans s/p procedure  Narda Bonds, PharmD, BCPS Clinical Pharmacist Phone: [phone]

## 2020-04-26 ENCOUNTER — Encounter (HOSPITAL_COMMUNITY): Payer: Self-pay | Admitting: Internal Medicine

## 2020-04-26 LAB — CBC
HCT: 16.9 % — ABNORMAL LOW (ref 36.0–46.0)
Hemoglobin: 6 g/dL — CL (ref 12.0–15.0)
MCH: 45.1 pg — ABNORMAL HIGH (ref 26.0–34.0)
MCHC: 35.5 g/dL (ref 30.0–36.0)
MCV: 127.1 fL — ABNORMAL HIGH (ref 80.0–100.0)
Platelets: 218 10*3/uL (ref 150–400)
RBC: 1.33 MIL/uL — ABNORMAL LOW (ref 3.87–5.11)
RDW: 15 % (ref 11.5–15.5)
WBC: 4.3 10*3/uL (ref 4.0–10.5)
nRBC: 0 % (ref 0.0–0.2)

## 2020-04-26 LAB — BASIC METABOLIC PANEL
Anion gap: 8 (ref 5–15)
BUN: 4 mg/dL — ABNORMAL LOW (ref 6–20)
CO2: 17 mmol/L — ABNORMAL LOW (ref 22–32)
Calcium: 8.1 mg/dL — ABNORMAL LOW (ref 8.9–10.3)
Chloride: 111 mmol/L (ref 98–111)
Creatinine, Ser: 1.13 mg/dL — ABNORMAL HIGH (ref 0.44–1.00)
GFR calc Af Amer: >60 mL/min (ref >60)
GFR calc non Af Amer: 54 mL/min — ABNORMAL LOW (ref >60)
Glucose, Bld: 72 mg/dL (ref 70–99)
Potassium: 3.9 mmol/L (ref 3.5–5.1)
Sodium: 136 mmol/L (ref 135–145)

## 2020-04-26 LAB — PROTIME-INR
INR: 2.8 — ABNORMAL HIGH (ref 0.8–1.2)
Prothrombin Time: 28.6 seconds — ABNORMAL HIGH (ref 11.4–15.2)

## 2020-04-26 LAB — HEMOGLOBIN AND HEMATOCRIT, BLOOD
HCT: 25.7 % — ABNORMAL LOW (ref 36.0–46.0)
Hemoglobin: 8.8 g/dL — ABNORMAL LOW (ref 12.0–15.0)

## 2020-04-26 LAB — PREPARE RBC (CROSSMATCH)

## 2020-04-26 MED ORDER — SODIUM CHLORIDE 0.9% IV SOLUTION: Freq: Once | INTRAVENOUS | Status: DC

## 2020-04-26 NOTE — Plan of Care (Signed)
°  Problem: Education: Goal: Knowledge of General Education information will improve Description: Including pain rating scale, medication(s)/side effects and non-pharmacologic comfort measures Outcome: Progressing   Problem: Clinical Measurements: Goal: Ability to maintain clinical measurements within normal limits will improve Outcome: Progressing Goal: Will remain free from infection Outcome: Progressing Goal: Diagnostic test results will improve Outcome: Progressing   Problem: Clinical Measurements: Goal: Will remain free from infection Outcome: Progressing   Problem: Clinical Measurements: Goal: Diagnostic test results will improve Outcome: Progressing   

## 2020-04-26 NOTE — Progress Notes (Signed)
     [facility] Progress Note  CC:  Anemia, nausea and vomiting, diarrhea  Subjective:  INR 2.8 this AM.  Hgb 6.0 grams so receiving 2 units PRBCs.  Denies any sign of GI bleeding.  Sitting up in bed, very talkative.  Objective:  Vital signs in last 24 hours: Temp:  [97.6 F (36.4 C)-98 F (36.7 C)] 98 F (36.7 C) (08/01 1135) Pulse Rate:  [92-110] 99 (08/01 1135) Resp:  [16-20] 20 (08/01 1135) BP: (100-120)/(52-82) 104/82 (08/01 1135) SpO2:  [99 %-100 %] 100 % (08/01 1135) Last BM Date: 04/24/20 General:  Alert, Well-developed, in NAD Heart:  Regular rate and rhythm; mechanical valve click noted. Pulm:  CTAB.  No W/R/R. Abdomen:  Soft, non-distended.  BS present.  Non-tender.  Extremities:  Without edema. Neurologic:  Alert and oriented x 4;  grossly normal neurologically. Psych:  Alert and cooperative. Normal mood and affect.  Intake/Output from previous day: 07/31 0701 - 08/01 0700 In: 108.3 [I.V.:108.3] Out: -   Lab Results: Recent Labs    04/24/20 0500 04/25/20 0143 04/26/20 0453  WBC 7.1 6.9 4.3  HGB 7.2* 7.8* 6.0*  HCT 19.5* 21.2* 16.9*  PLT 312 320 218   BMET Recent Labs    04/24/20 0500 04/25/20 0143 04/26/20 0453  NA 129* 135 136  K 4.7 3.3* 3.9  CL 94* 105 111  CO2 24 15* 17*  GLUCOSE 97 85 72  BUN 10 7 4*  CREATININE 1.72* 1.52* 1.13*  CALCIUM 8.0* 8.6* 8.1*   LFT Recent Labs    04/24/20 0500  PROT 5.4*  ALBUMIN 2.6*  AST 36  ALT 18  ALKPHOS 123  BILITOT 0.8   PT/INR Recent Labs    04/25/20 0143 04/26/20 0453  LABPROT 22.4* 28.6*  INR 2.1* 2.8*   [place] EKG SITE RITE  Result Date: 04/25/2020 If Site Rite image not attached, placement could not be confirmed due to current cardiac rhythm.  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 7)  Chronic anticoagulation (subtherapeutic);  INR 2.8 today. 8) Hyponatremia-resolved 9) GEJ thickening on CT  -Pantoprazole 40 mg IV daily. -Antiemetics prn -GI PCR panel negative/normal.  C. difficile negative -Will need repeat iron panel at some point to reevaluate the very elevated saturation at 35% -Continue folic acid supplementatino -IV fluids for AKI -Coumadin stopped.   -Due to elevated INR at 2.8 we do not think that it will be low enough to proceed with EGD/push enteroscopy and colonoscopy tomorrow for diagnostic and therapeutic intent.  Planning to keep her on clear liquids and then possible procedures on Tuesday.  She has already drank a full prep and has ben maintained on clears so hopefully 1/2 prep Monday night or some Miralax prep will be adequate. -STOP HEPARIN gtt 4 hours prior to procedure. -INR check tomorrow a.m.  Plan to proceed with endoscopic procedures when <1.8 to allow appropriate diagnostic and therapeutic interventions   LOS: 2 days   Laban Emperor. Kuper Rennels  04/26/2020, 1:26 PM

## 2020-04-26 NOTE — Progress Notes (Signed)
Progress Note    Mary [name]  GYB:[id number] DOB: 01-Dec-1960  DOA: 04/23/2020 PCP: Antony Blackbird, MD    Brief Narrative:     Medical records reviewed and are as summarized below:  Mary [name] is an 59 y.o. female with a past medical history of mitral valve replacement on warfarin, history of macrocytic anemia, history of GERD who was in her usual state of health till about 4 to 5 days ago when she started developing nausea followed by onset of vomiting and diarrhea.  She denies any blood in the emesis or in the stool.  She has had profuse amount of watery stool.  Denies any recent antibiotic use.  Denies eating out anywhere recently.  She does have some abdominal discomfort associated with the symptoms.  Denies any fever or chills.  Denies any dysuria.  She does admit to acid reflux symptoms.  She had similar presentation back in April when she had to be hospitalized for dehydration.  She has never had upper endoscopy or colonoscopy.  It appears that these procedures were planned in May however patient could not find transportation and could not afford the medications needed for prep.  Assessment/Plan:   Principal Problem:   Acute gastroenteritis Active Problems:   Warfarin anticoagulation   History of rheumatic heart disease   Hyponatremia   Dehydration   Macrocytic anemia   N&V (nausea and vomiting)   H/O mitral valve replacement   Diarrhea   N/V/anemia/acute gastritits  -GI consult for endoscopies planned for when INR <21.8 -symptomatic treatment -getting 2 units of PRBCs  Hyponatremia and hypokalemia/dehydration:  -setting of GI loss -repleted and rechecked  AKI -resolved  History of rheumatic heart disease with history of mitral valve replacement with mechanical valve: Followed by Dr. Marlou Porch with cardiology.  She is on warfarin for anticoagulation. Goal 2.5-3.5 -IV heparin with plan to bridge back to coumadin post procedure -let INR drift down  naturally  macrocytic anemia:  -folic acid low- replace PO -2 units PRBC  ? Alcohol use (in past has reported PRN wine consumption) -monitor for withdrawal   Family Communication/Anticipated D/C date and plan/Code Status   DVT prophylaxis: heparin gtt Code Status: Full Code.  Disposition Plan: Status is: Inpatient  Remains inpatient appropriate because:IV treatments appropriate due to intensity of illness or inability to take PO   Dispo: The patient is from: Home              Anticipated d/c is to: Home              Anticipated d/c date is: > 3 days              Patient currently is not medically stable to d/c. after procedures, will need bridge back to INR of 2.5-3.5         Medical Consultants:    GI  Subjective:   Says her last coumadin dose of Wednesday night  Objective:    Vitals:   04/26/20 1135 04/26/20 1400 04/26/20 1408 04/26/20 1426  BP: 104/82 (!) 138/86 (!) 138/86 127/71  Pulse: 99 100 100 95  Resp: 20 20 20 20   Temp: 98 F (36.7 C) 98 F (36.7 C) 98 F (36.7 C) 98.5 F (36.9 C)  TempSrc: Oral Oral Oral Oral  SpO2: 100% 100% 100% 100%    Intake/Output Summary (Last 24 hours) at 04/26/2020 1556 Last data filed at 04/26/2020 1400 Gross per 24 hour  Intake 320 ml  Output --  Net 320 ml   There were no vitals filed for this visit.  Exam:   General: Appearance:    Well developed, well nourished female in no acute distress     Lungs:     Clear to auscultation bilaterally, respirations unlabored  Heart:    Normal heart rate. Normal rhythm.   MS:   All extremities are intact.   Neurologic:   Awake, alert, oriented x 3. No apparent focal neurological           defect.     Data Reviewed:   I have personally reviewed following labs and imaging studies:  Labs: Labs show the following:   Basic Metabolic Panel: Recent Labs  Lab 04/23/20 1219 04/23/20 1219 04/24/20 0500 04/24/20 0500 04/25/20 0143 04/26/20 0453  NA 127*  --  129*   --  135 136  K 3.1*   < > 4.7   < > 3.3* 3.9  CL 88*  --  94*  --  105 111  CO2 24  --  24  --  15* 17*  GLUCOSE 80  --  97  --  85 72  BUN 9  --  10  --  7 4*  CREATININE 1.17*  --  1.72*  --  1.52* 1.13*  CALCIUM 8.6*  --  8.0*  --  8.6* 8.1*  MG  --   --  1.5*  --  2.3  --    < > = values in this interval not displayed.   GFR Estimated Creatinine Clearance: 43.3 mL/min (A) (by C-G formula based on SCr of 1.13 mg/dL (H)). Liver Function Tests: Recent Labs  Lab 04/23/20 1219 04/24/20 0500  AST 45* 36  ALT 23 18  ALKPHOS 138* 123  BILITOT 0.9 0.8  PROT 6.2* 5.4*  ALBUMIN 3.0* 2.6*   Recent Labs  Lab 04/23/20 1219  LIPASE 15   No results for input(s): AMMONIA in the last 168 hours. Coagulation profile Recent Labs  Lab 04/23/20 2250 04/24/20 0500 04/25/20 0143 04/26/20 0453  INR 2.0* 2.0* 2.1* 2.8*    CBC: Recent Labs  Lab 04/23/20 1219 04/24/20 0500 04/25/20 0143 04/26/20 0453  WBC 7.2 7.1 6.9 4.3  HGB 8.9* 7.2* 7.8* 6.0*  HCT 23.8* 19.5* 21.2* 16.9*  MCV 120.2* 121.9* 123.3* 127.1*  PLT 337 312 320 218   Cardiac Enzymes: No results for input(s): CKTOTAL, CKMB, CKMBINDEX, TROPONINI in the last 168 hours. BNP (last 3 results) No results for input(s): PROBNP in the last 8760 hours. CBG: No results for input(s): GLUCAP in the last 168 hours. D-Dimer: No results for input(s): DDIMER in the last 72 hours. Hgb A1c: No results for input(s): HGBA1C in the last 72 hours. Lipid Profile: No results for input(s): CHOL, HDL, LDLCALC, TRIG, CHOLHDL, LDLDIRECT in the last 72 hours. Thyroid function studies: No results for input(s): TSH, T4TOTAL, T3FREE, THYROIDAB in the last 72 hours.  Invalid input(s): FREET3 Anemia work up: Recent Labs    04/24/20 0500  VITAMINB12 376  FOLATE 3.7*  FERRITIN 186  TIBC 193*  IRON 168  RETICCTPCT 1.6   Sepsis Labs: Recent Labs  Lab 04/23/20 1219 04/24/20 0500 04/25/20 0143 04/26/20 0453  WBC 7.2 7.1 6.9 4.3     Microbiology Recent Results (from the past 240 hour(s))  SARS Coronavirus 2 by RT PCR (hospital order, performed in [facility] hospital lab) Nasopharyngeal Nasopharyngeal Swab     Status: None   Collection Time: 04/23/20  7:46 PM  Specimen: Nasopharyngeal Swab  Result Value Ref Range Status   SARS Coronavirus 2 NEGATIVE NEGATIVE Final    Comment: (NOTE) SARS-CoV-2 target nucleic acids are NOT DETECTED.  The SARS-CoV-2 RNA is generally detectable in upper and lower respiratory specimens during the acute phase of infection. The lowest concentration of SARS-CoV-2 viral copies this assay can detect is 250 copies / mL. A negative result does not preclude SARS-CoV-2 infection and should not be used as the sole basis for treatment or other patient management decisions.  A negative result may occur with improper specimen collection / handling, submission of specimen other than nasopharyngeal swab, presence of viral mutation(s) within the areas targeted by this assay, and inadequate number of viral copies (<250 copies / mL). A negative result must be combined with clinical observations, patient history, and epidemiological information.  Fact Sheet for Patients:   StrictlyIdeas.no  Fact Sheet for Healthcare Providers: BankingDealers.co.za  This test is not yet approved or  cleared by the [place] FDA and has been authorized for detection and/or diagnosis of SARS-CoV-2 by FDA under an Emergency Use Authorization (EUA).  This EUA will remain in effect (meaning this test can be used) for the duration of the COVID-19 declaration under Section 564(b)(1) of the Act, 21 U.S.C. section 360bbb-3(b)(1), unless the authorization is terminated or revoked sooner.  Performed at [facility], [place] [address] [place], [place] [address]   Gastrointestinal Panel by PCR , Stool     Status: None   Collection Time: 04/24/20  4:55 AM    Specimen: Stool  Result Value Ref Range Status   Campylobacter species NOT DETECTED NOT DETECTED Final   Plesimonas shigelloides NOT DETECTED NOT DETECTED Final   Salmonella species NOT DETECTED NOT DETECTED Final   Yersinia enterocolitica NOT DETECTED NOT DETECTED Final   Vibrio species NOT DETECTED NOT DETECTED Final   Vibrio cholerae NOT DETECTED NOT DETECTED Final   Enteroaggregative E coli (EAEC) NOT DETECTED NOT DETECTED Final   Enteropathogenic E coli (EPEC) NOT DETECTED NOT DETECTED Final   Enterotoxigenic E coli (ETEC) NOT DETECTED NOT DETECTED Final   Shiga like toxin producing E coli (STEC) NOT DETECTED NOT DETECTED Final   Shigella/Enteroinvasive E coli (EIEC) NOT DETECTED NOT DETECTED Final   Cryptosporidium NOT DETECTED NOT DETECTED Final   Cyclospora cayetanensis NOT DETECTED NOT DETECTED Final   Entamoeba histolytica NOT DETECTED NOT DETECTED Final   Giardia lamblia NOT DETECTED NOT DETECTED Final   Adenovirus F40/41 NOT DETECTED NOT DETECTED Final   Astrovirus NOT DETECTED NOT DETECTED Final   Norovirus GI/GII NOT DETECTED NOT DETECTED Final   Rotavirus A NOT DETECTED NOT DETECTED Final   Sapovirus (I, II, IV, and V) NOT DETECTED NOT DETECTED Final    Comment: Performed at [facility], [address] [place], [place] [address]  C Difficile Quick Screen w PCR reflex     Status: None   Collection Time: 04/24/20  4:55 AM   Specimen: Stool  Result Value Ref Range Status   C Diff antigen NEGATIVE NEGATIVE Final   C Diff toxin NEGATIVE NEGATIVE Final   C Diff interpretation No C. difficile detected.  Final    Comment: Performed at [facility], [place] [address] [place], [place] [address]    Procedures and diagnostic studies:  [place] EKG SITE RITE  Result Date: 04/25/2020 If Site Rite image not attached, placement could not be confirmed due to current cardiac rhythm.   Medications:   .  sodium chloride   Intravenous Once  . Chlorhexidine Gluconate  Cloth  6 each Topical Daily  . folic acid  1 mg Oral Daily  . gabapentin  300 mg Oral QHS  . pantoprazole (PROTONIX) IV  40 mg Intravenous Q24H  . sodium chloride flush  10-40 mL Intracatheter Q12H  . sodium chloride flush  10-40 mL Intracatheter Q12H   Continuous Infusions:    LOS: 2 days   Geradine Girt  [facility]   How to contact the [facility] Attending or Consulting provider [place] or covering provider during after hours [place], for this patient?  1. Check the care team in [facility] and look for a) attending/consulting [facility] provider listed and b) the [facility] team listed 2. Log into [email or web address] and use [place]'s universal password to access. If you do not have the password, please contact the hospital operator. 3. Locate the [facility] provider you are looking for under [facility] and page to a number that you can be directly reached. 4. If you still have difficulty reaching the provider, please page the [facility] (Director on Call) for the Hospitalists listed on amion for assistance.  04/26/2020, 3:56 PM

## 2020-04-26 NOTE — Progress Notes (Signed)
CRITICAL VALUE ALERT  Critical Value:  6.0  Date & Time Notied:  04/26/20 0550  Provider Notified: X. Blount, NP  Orders Received/Actions taken:  Text paged, awaiting response

## 2020-04-26 NOTE — Progress Notes (Signed)
[place] for Heparin (warfarin on hold) Indication: Mechanical MVR  Allergies  Allergen Reactions  . Oxycodone Itching    Patient Measurements: 49.9 kg (110 lb)  Vital Signs: Temp: 98.5 F (36.9 C) (08/01 1426) Temp Source: Oral (08/01 1426) BP: 127/71 (08/01 1426) Pulse Rate: 95 (08/01 1426)  Labs: Recent Labs    04/24/20 0500 04/24/20 0500 04/24/20 0914 04/24/20 1354 04/25/20 0143 04/25/20 2120 04/26/20 0453  HGB 7.2*   < >  --   --  7.8*  --  6.0*  HCT 19.5*  --   --   --  21.2*  --  16.9*  PLT 312  --   --   --  320  --  218  LABPROT 21.8*  --   --   --  22.4*  --  28.6*  INR 2.0*  --   --   --  2.1*  --  2.8*  HEPARINUNFRC  --   --  0.16* 0.45  --  0.32  --   CREATININE 1.72*  --   --   --  1.52*  --  1.13*   < > = values in this interval not displayed.    Estimated Creatinine Clearance: 43.3 mL/min (A) (by C-G formula based on SCr of 1.13 mg/dL (H)).  Assessment: 59 yo female on warfarin PTA for hx of mechanical MVR, holding warfarin and starting heparin in case procedures are needed during hospital stay. INR goal PTA is 2.5-3.5 and INR is 2 so heparin was started. Patient's PTA warfarin dose is 7.5 mg MWF and 5 mg on all other days of the week. Pharmacy is consulted to dose heparin.  Patient has been on heparin drip on and off due to procedure being rescheduled twice. Pt was therapeutic on 800 units/hr. The patient is scheduled for EGD w/ colonoscopy on 8/3 at 0900. Per Dr. Eliseo Squires, start heparin drip when INR <2.5. Per gastroenterology proceed with procedure when INR is <1.8 and stop heparin drip 4-hrs prior to procedure. CBC is WNL, will resume heparin drip when INR is <2.5.   Goal of Therapy:  Heparin level 0.3-0.7 units/ml Monitor platelets by anticoagulation protocol: Yes   Plan:  Continue to hold heparin drip until INR <2.5 then restart Stop heparin drip 4-hrs prior to procedure on 8/3 Monitor PT/INR and  CBC Bridge to warfarin post-procedure Monitor for signs and symptoms of bleeding  Shauna Hugh, PharmD, [place]  PGY-1 Pharmacy Resident 04/26/2020 3:41 PM  Please check [email or web address] for unit-specific pharmacy phone numbers.

## 2020-04-27 ENCOUNTER — Encounter (HOSPITAL_COMMUNITY): Payer: Self-pay | Admitting: Certified Registered Nurse Anesthetist

## 2020-04-27 DIAGNOSIS — D539 Nutritional anemia, unspecified: Secondary | ICD-10-CM

## 2020-04-27 LAB — TYPE AND SCREEN
ABO/RH(D): O POS
Antibody Screen: NEGATIVE
Donor AG Type: NEGATIVE
Donor AG Type: NEGATIVE
Unit division: 0
Unit division: 0

## 2020-04-27 LAB — CBC
HCT: 24.3 % — ABNORMAL LOW (ref 36.0–46.0)
Hemoglobin: 8.4 g/dL — ABNORMAL LOW (ref 12.0–15.0)
MCH: 33.7 pg (ref 26.0–34.0)
MCHC: 34.6 g/dL (ref 30.0–36.0)
MCV: 97.6 fL (ref 80.0–100.0)
Platelets: 152 10*3/uL (ref 150–400)
RBC: 2.49 MIL/uL — ABNORMAL LOW (ref 3.87–5.11)
WBC: 5.5 10*3/uL (ref 4.0–10.5)
nRBC: 0 % (ref 0.0–0.2)

## 2020-04-27 LAB — BPAM RBC
Blood Product Expiration Date: 202108282359
Blood Product Expiration Date: 202109012359
ISSUE DATE / TIME: 202108011054
ISSUE DATE / TIME: 202108011405
Unit Type and Rh: 9500
Unit Type and Rh: 9500

## 2020-04-27 LAB — PROTIME-INR
INR: 3.1 — ABNORMAL HIGH (ref 0.8–1.2)
Prothrombin Time: 31.3 seconds — ABNORMAL HIGH (ref 11.4–15.2)

## 2020-04-27 LAB — BASIC METABOLIC PANEL
Anion gap: 8 (ref 5–15)
BUN: <5 mg/dL — ABNORMAL LOW (ref 6–20)
CO2: 16 mmol/L — ABNORMAL LOW (ref 22–32)
Calcium: 8 mg/dL — ABNORMAL LOW (ref 8.9–10.3)
Chloride: 112 mmol/L — ABNORMAL HIGH (ref 98–111)
Creatinine, Ser: 0.93 mg/dL (ref 0.44–1.00)
GFR calc Af Amer: >60 mL/min (ref >60)
GFR calc non Af Amer: >60 mL/min (ref >60)
Glucose, Bld: 66 mg/dL — ABNORMAL LOW (ref 70–99)
Potassium: 3.4 mmol/L — ABNORMAL LOW (ref 3.5–5.1)
Sodium: 136 mmol/L (ref 135–145)

## 2020-04-27 MED ORDER — POTASSIUM CHLORIDE CRYS ER 20 MEQ PO TBCR
40.0000 meq | EXTENDED_RELEASE_TABLET | Freq: Once | ORAL | Status: AC
  Administered 2020-04-27: 40 meq via ORAL
  Filled 2020-04-27: qty 2

## 2020-04-27 NOTE — Progress Notes (Signed)
Mary [name] for Heparin (warfarin on hold) Indication: Mechanical MVR  Allergies  Allergen Reactions  . Oxycodone Itching    Patient Measurements: 49.9 kg (110 lb)  Vital Signs: Temp: 98.6 F (37 C) (08/02 0412) Temp Source: Oral (08/02 0412) BP: 109/71 (08/02 0412) Pulse Rate: 98 (08/02 0412)  Labs: Recent Labs    04/24/20 0914 04/24/20 1354 04/25/20 0143 04/25/20 0143 04/25/20 2120 04/26/20 0453 04/26/20 0453 04/26/20 2143 04/27/20 0455  HGB  --   --  7.8*   < >  --  6.0*   < > 8.8* 8.4*  HCT  --   --  21.2*   < >  --  16.9*  --  25.7* 24.3*  PLT  --   --  320  --   --  218  --   --  152  LABPROT  --   --  22.4*  --   --  28.6*  --   --  31.3*  INR  --   --  2.1*  --   --  2.8*  --   --  3.1*  HEPARINUNFRC 0.16* 0.45  --   --  0.32  --   --   --   --   CREATININE  --   --  1.52*  --   --  1.13*  --   --  0.93   < > = values in this interval not displayed.    Estimated Creatinine Clearance: 52.5 mL/min (by C-G formula based on SCr of 0.93 mg/dL).  Assessment: 59 yo female on warfarin PTA for hx of mechanical MVR, holding warfarin and starting heparin in case procedures are needed during hospital stay. INR goal PTA is 2.5-3.5 and INR is 2 so heparin was started. Patient's PTA warfarin dose is 7.5 mg MWF and 5 mg on all other days of the week. Pharmacy is consulted to dose heparin.  Patient has been on heparin drip on and off due to procedure being rescheduled twice. Pt was therapeutic on 800 units/hr. The patient is scheduled for EGD w/ colonoscopy on 8/3 at 0900. Per Dr. Eliseo Squires, start heparin drip when INR <2.5. Per gastroenterology proceed with procedure when INR is <1.8 and stop heparin drip 4-hrs prior to procedure. Hgb is 8.4, will resume heparin drip when INR is <2.5.   INR today is up to 3.1  Goal of Therapy:  Heparin level 0.3-0.7 units/ml Monitor platelets by anticoagulation protocol: Yes   Plan:  Continue to hold  heparin drip until INR <2.5 then restart Stop heparin drip 4-hrs prior to procedure on 8/3 Monitor PT/INR and CBC Bridge to warfarin post-procedure Monitor for signs and symptoms of bleeding  Mary [name] A. Levada Dy, PharmD, BCPS, FNKF Clinical Pharmacist [place] Please utilize Amion for appropriate phone number to reach the unit pharmacist ([place])

## 2020-04-27 NOTE — Progress Notes (Signed)
Progress Note    Mary [name]  RVU:[id number] DOB: 12/01/1960  DOA: 04/23/2020 PCP: Antony Blackbird, MD    Brief Narrative:     Medical records reviewed and are as summarized below:  Mary [name] is an 59 y.o. female with a past medical history of mitral valve replacement on warfarin, history of macrocytic anemia, history of GERD who was in her usual state of health till about 4 to 5 days ago when she started developing nausea followed by onset of vomiting and diarrhea.  She denies any blood in the emesis or in the stool.  She has had profuse amount of watery stool.  Denies any recent antibiotic use.  Denies eating out anywhere recently.  She does have some abdominal discomfort associated with the symptoms.  Denies any fever or chills.  Denies any dysuria.  She does admit to acid reflux symptoms.  She had similar presentation back in April when she had to be hospitalized for dehydration.  She has never had upper endoscopy or colonoscopy.  It appears that these procedures were planned in May however patient could not find transportation and could not afford the medications needed for prep.  Assessment/Plan:   Principal Problem:   Acute gastroenteritis Active Problems:   Warfarin anticoagulation   History of rheumatic heart disease   Hyponatremia   Dehydration   Macrocytic anemia   N&V (nausea and vomiting)   H/O mitral valve replacement   Diarrhea   N/V/anemia/acute gastritits  -GI consult for endoscopies planned for when INR <1.8 (spoke with cardiology and confirmed that due to valve will need to trend down on own) -patient states that her last coumadin dose was Wednesday.  INR has continued to climb up since admission and patient has been OFF coumadin -symptomatic treatment -s/p 2 units of PRBCs 8/1  Hyponatremia and hypokalemia/dehydration:  -setting of GI loss -repleted and rechecked  AKI -resolved  History of rheumatic heart disease with history of mitral valve  replacement with mechanical valve: Followed by Dr. Marlou Porch with cardiology.  She is on warfarin for anticoagulation. Goal 2.5-3.5 -IV heparin with plan to bridge back to coumadin post procedure -let INR drift down naturally  macrocytic anemia:  -folic acid low- replace PO -2 units PRBC B12: 376  ? Alcohol use (in past has reported PRN wine consumption) -monitor for withdrawal   Family Communication/Anticipated D/C date and plan/Code Status   DVT prophylaxis: heparin gtt when INR <2 Code Status: Full Code.  Disposition Plan: Status is: Inpatient  Remains inpatient appropriate because:IV treatments appropriate due to intensity of illness or inability to take PO   Dispo: The patient is from: Home              Anticipated d/c is to: Home              Anticipated d/c date is: > 3 days              Patient currently is not medically stable to d/c. after procedures, will need bridge back to INR of 2.5-3.5         Medical Consultants:    GI  Subjective:  No SOB, no CP  Objective:    Vitals:   04/26/20 2046 04/26/20 2344 04/27/20 0412 04/27/20 1253  BP:  122/85 109/71 (!) 147/87  Pulse:  88 98 91  Resp:  16 18 18   Temp:  98.3 F (36.8 C) 98.6 F (37 C) 97.9 F (36.6 C)  TempSrc:  Oral Oral Oral  SpO2:  96% 100% 100%  Weight: 49.8 kg     Height: 5\' 2"  (1.575 m)       Intake/Output Summary (Last 24 hours) at 04/27/2020 1418 Last data filed at 04/27/2020 0414 Gross per 24 hour  Intake 490 ml  Output --  Net 490 ml   Filed Weights   04/26/20 2046  Weight: 49.8 kg    Exam:   General: Appearance:    Well developed, well nourished female in no acute distress     Lungs:     Clear to auscultation bilaterally, respirations unlabored     MS:   All extremities are intact.   Neurologic:   Awake, alert, oriented x 3. No apparent focal neurological           defect.     Data Reviewed:   I have personally reviewed following labs and imaging  studies:  Labs: Labs show the following:   Basic Metabolic Panel: Recent Labs  Lab 04/23/20 1219 04/23/20 1219 04/24/20 0500 04/24/20 0500 04/25/20 0143 04/25/20 0143 04/26/20 0453 04/27/20 0455  NA 127*  --  129*  --  135  --  136 136  K 3.1*   < > 4.7   < > 3.3*   < > 3.9 3.4*  CL 88*  --  94*  --  105  --  111 112*  CO2 24  --  24  --  15*  --  17* 16*  GLUCOSE 80  --  97  --  85  --  72 66*  BUN 9  --  10  --  7  --  4* <5*  CREATININE 1.17*  --  1.72*  --  1.52*  --  1.13* 0.93  CALCIUM 8.6*  --  8.0*  --  8.6*  --  8.1* 8.0*  MG  --   --  1.5*  --  2.3  --   --   --    < > = values in this interval not displayed.   GFR Estimated Creatinine Clearance: 52.5 mL/min (by C-G formula based on SCr of 0.93 mg/dL). Liver Function Tests: Recent Labs  Lab 04/23/20 1219 04/24/20 0500  AST 45* 36  ALT 23 18  ALKPHOS 138* 123  BILITOT 0.9 0.8  PROT 6.2* 5.4*  ALBUMIN 3.0* 2.6*   Recent Labs  Lab 04/23/20 1219  LIPASE 15   No results for input(s): AMMONIA in the last 168 hours. Coagulation profile Recent Labs  Lab 04/23/20 2250 04/24/20 0500 04/25/20 0143 04/26/20 0453 04/27/20 0455  INR 2.0* 2.0* 2.1* 2.8* 3.1*    CBC: Recent Labs  Lab 04/23/20 1219 04/23/20 1219 04/24/20 0500 04/25/20 0143 04/26/20 0453 04/26/20 2143 04/27/20 0455  WBC 7.2  --  7.1 6.9 4.3  --  5.5  HGB 8.9*   < > 7.2* 7.8* 6.0* 8.8* 8.4*  HCT 23.8*   < > 19.5* 21.2* 16.9* 25.7* 24.3*  MCV 120.2*  --  121.9* 123.3* 127.1*  --  97.6  PLT 337  --  312 320 218  --  152   < > = values in this interval not displayed.   Cardiac Enzymes: No results for input(s): CKTOTAL, CKMB, CKMBINDEX, TROPONINI in the last 168 hours. BNP (last 3 results) No results for input(s): PROBNP in the last 8760 hours. CBG: No results for input(s): GLUCAP in the last 168 hours. D-Dimer: No results for input(s): DDIMER in the last 72 hours.  Hgb A1c: No results for input(s): HGBA1C in the last 72  hours. Lipid Profile: No results for input(s): CHOL, HDL, LDLCALC, TRIG, CHOLHDL, LDLDIRECT in the last 72 hours. Thyroid function studies: No results for input(s): TSH, T4TOTAL, T3FREE, THYROIDAB in the last 72 hours.  Invalid input(s): FREET3 Anemia work up: No results for input(s): VITAMINB12, FOLATE, FERRITIN, TIBC, IRON, RETICCTPCT in the last 72 hours. Sepsis Labs: Recent Labs  Lab 04/24/20 0500 04/25/20 0143 04/26/20 0453 04/27/20 0455  WBC 7.1 6.9 4.3 5.5    Microbiology Recent Results (from the past 240 hour(s))  SARS Coronavirus 2 by RT PCR (hospital order, performed in [facility] hospital lab) Nasopharyngeal Nasopharyngeal Swab     Status: None   Collection Time: 04/23/20  7:46 PM   Specimen: Nasopharyngeal Swab  Result Value Ref Range Status   SARS Coronavirus 2 NEGATIVE NEGATIVE Final    Comment: (NOTE) SARS-CoV-2 target nucleic acids are NOT DETECTED.  The SARS-CoV-2 RNA is generally detectable in upper and lower respiratory specimens during the acute phase of infection. The lowest concentration of SARS-CoV-2 viral copies this assay can detect is 250 copies / mL. A negative result does not preclude SARS-CoV-2 infection and should not be used as the sole basis for treatment or other patient management decisions.  A negative result may occur with improper specimen collection / handling, submission of specimen other than nasopharyngeal swab, presence of viral mutation(s) within the areas targeted by this assay, and inadequate number of viral copies (<250 copies / mL). A negative result must be combined with clinical observations, patient history, and epidemiological information.  Fact Sheet for Patients:   StrictlyIdeas.no  Fact Sheet for Healthcare Providers: BankingDealers.co.za  This test is not yet approved or  cleared by the [place] FDA and has been authorized for detection and/or diagnosis of  SARS-CoV-2 by FDA under an Emergency Use Authorization (EUA).  This EUA will remain in effect (meaning this test can be used) for the duration of the COVID-19 declaration under Section 564(b)(1) of the Act, 21 U.S.C. section 360bbb-3(b)(1), unless the authorization is terminated or revoked sooner.  Performed at [facility], [place] [address] [place], [place] [address]   Gastrointestinal Panel by PCR , Stool     Status: None   Collection Time: 04/24/20  4:55 AM   Specimen: Stool  Result Value Ref Range Status   Campylobacter species NOT DETECTED NOT DETECTED Final   Plesimonas shigelloides NOT DETECTED NOT DETECTED Final   Salmonella species NOT DETECTED NOT DETECTED Final   Yersinia enterocolitica NOT DETECTED NOT DETECTED Final   Vibrio species NOT DETECTED NOT DETECTED Final   Vibrio cholerae NOT DETECTED NOT DETECTED Final   Enteroaggregative E coli (EAEC) NOT DETECTED NOT DETECTED Final   Enteropathogenic E coli (EPEC) NOT DETECTED NOT DETECTED Final   Enterotoxigenic E coli (ETEC) NOT DETECTED NOT DETECTED Final   Shiga like toxin producing E coli (STEC) NOT DETECTED NOT DETECTED Final   Shigella/Enteroinvasive E coli (EIEC) NOT DETECTED NOT DETECTED Final   Cryptosporidium NOT DETECTED NOT DETECTED Final   Cyclospora cayetanensis NOT DETECTED NOT DETECTED Final   Entamoeba histolytica NOT DETECTED NOT DETECTED Final   Giardia lamblia NOT DETECTED NOT DETECTED Final   Adenovirus F40/41 NOT DETECTED NOT DETECTED Final   Astrovirus NOT DETECTED NOT DETECTED Final   Norovirus GI/GII NOT DETECTED NOT DETECTED Final   Rotavirus A NOT DETECTED NOT DETECTED Final   Sapovirus (I, II, IV, and V) NOT DETECTED  NOT DETECTED Final    Comment: Performed at [facility], [place], [place] [address]  C Difficile Quick Screen w PCR reflex     Status: None   Collection Time: 04/24/20  4:55 AM   Specimen: Stool  Result Value Ref Range Status   C Diff antigen  NEGATIVE NEGATIVE Final   C Diff toxin NEGATIVE NEGATIVE Final   C Diff interpretation No C. difficile detected.  Final    Comment: Performed at [facility], [place] [address] [place], [place] [address]    Procedures and diagnostic studies:  No results found.  Medications:   . sodium chloride   Intravenous Once  . Chlorhexidine Gluconate Cloth  6 each Topical Daily  . folic acid  1 mg Oral Daily  . gabapentin  300 mg Oral QHS  . pantoprazole (PROTONIX) IV  40 mg Intravenous Q24H  . sodium chloride flush  10-40 mL Intracatheter Q12H  . sodium chloride flush  10-40 mL Intracatheter Q12H   Continuous Infusions:    LOS: 3 days   Geradine Girt  [facility]   How to contact the [facility] Attending or Consulting provider [place] or covering provider during after hours [place], for this patient?  1. Check the care team in [facility] and look for a) attending/consulting TR[facility] provider listed and b) the [facility] team listed 2. Log into [email or web address] and use [employer]'s universal password to access. If you do not have the password, please contact the hospital operator. 3. Locate the [facility] provider you are looking for under [facility] and page to a number that you can be directly reached. 4. If you still have difficulty reaching the provider, please page the [facility] (Director on Call) for the Hospitalists listed on amion for assistance.  04/27/2020, 2:18 PM

## 2020-04-27 NOTE — Progress Notes (Addendum)
Daily Rounding Note  04/27/2020, 12:35 PM  LOS: 3 days   SUBJECTIVE:   Chief complaint:  Abd pain, N/V, diarrhea   Several days since ate solid food, on clears now and very hungry.  Some dizziness w standing.  Overall feels good.  No DOB, no CP  OBJECTIVE:         Vital signs in last 24 hours:    Temp:  [98 F (36.7 C)-98.6 F (37 C)] 98.6 F (37 C) (08/02 0412) Pulse Rate:  [88-100] 98 (08/02 0412) Resp:  [16-20] 18 (08/02 0412) BP: (109-145)/(71-86) 109/71 (08/02 0412) SpO2:  [96 %-100 %] 100 % (08/02 0412) Weight:  [49.8 kg] 49.8 kg (08/01 2046) Last BM Date: 04/26/20 Filed Weights   04/26/20 2046  Weight: 49.8 kg   General: alert, comfortable.  Not ill looking   Heart: RRR Chest: clear bil.   Abdomen: soft, NT, ND.  Active BS  Extremities: no CCE Neuro/Psych:  Oriented x 3.  No tremors or weakness.  Fluid speech.  Bright affect, cooperative.  Intake/Output from previous day: 08/01 0701 - 08/02 0700 In: 1050 [P.O.:720; I.V.:10; Blood:320] Out: -   Intake/Output this shift: No intake/output data recorded.  Lab Results: Recent Labs    04/25/20 0143 04/25/20 0143 04/26/20 0453 04/26/20 2143 04/27/20 0455  WBC 6.9  --  4.3  --  5.5  HGB 7.8*   < > 6.0* 8.8* 8.4*  HCT 21.2*   < > 16.9* 25.7* 24.3*  PLT 320  --  218  --  152   < > = values in this interval not displayed.   BMET Recent Labs    04/25/20 0143 04/26/20 0453 04/27/20 0455  NA 135 136 136  K 3.3* 3.9 3.4*  CL 105 111 112*  CO2 15* 17* 16*  GLUCOSE 85 72 66*  BUN 7 4* <5*  CREATININE 1.52* 1.13* 0.93  CALCIUM 8.6* 8.1* 8.0*   LFT No results for input(s): PROT, ALBUMIN, AST, ALT, ALKPHOS, BILITOT, BILIDIR, IBILI in the last 72 hours. PT/INR Recent Labs    04/26/20 0453 04/27/20 0455  LABPROT 28.6* 31.3*  INR 2.8* 3.1*   Hepatitis Panel No results for input(s): HEPBSAG, HCVAB, HEPAIGM, HEPBIGM in the last 72  hours.  Studies/Results: No results found.   Scheduled Meds: . sodium chloride   Intravenous Once  . Chlorhexidine Gluconate Cloth  6 each Topical Daily  . folic acid  1 mg Oral Daily  . gabapentin  300 mg Oral QHS  . pantoprazole (PROTONIX) IV  40 mg Intravenous Q24H  . sodium chloride flush  10-40 mL Intracatheter Q12H  . sodium chloride flush  10-40 mL Intracatheter Q12H   Continuous Infusions: PRN Meds:.acetaminophen **OR** acetaminophen, ondansetron **OR** ondansetron (ZOFRAN) IV, sodium chloride flush, sodium chloride flush   ASSESMENT:   *   N/V/D, abd pain Thickened GEJ per CT.   Stool path PCR negative.  Enteroscopy, colonoscopy plans for 8/1 on hold (drank prep) due to elevating INR.     *   Macrocytic anemia.  Sp 2 PRBCs.   Hgb 6 >> 8.4.   Folate deficiency.  Elevated iron sats.    *    AKI  *   Coagulopathy.   On Chronic Coumadin, now Heparin for mechanical AVR INR heading up despite holding Coumadin, 2.1 >> 2.8 >> 3.1 in last 3 days.     PLAN   *   Switch to po  protonix 40 mg bid.  Allow HH diet.  Daily AM INR.  Colonoscopy, enteroscopy when INR ~ 1.8.     Azucena Freed  04/27/2020, 12:35 PM Phone [phone]  ________________________________________________________________________  Velora Heckler GI MD note:  I personally examined the patient, reviewed the data and agree with the assessment and plan described above.  She had severe MACROcytic anemia.  Probably not related to blood loss however certainly safest to evaluate with EGD and colonoscopy. Her INR was 3 this morning. Hoping that we'll be able to get to this by Wednesday (INR drifting down, needs to be 1.8 or below). Heparin to be started by primary team when appropriate.  Will follow along.   Owens Loffler, MD [facility] Gastroenterology Pager [phone]

## 2020-04-28 ENCOUNTER — Encounter (HOSPITAL_COMMUNITY)
Admission: EM | Disposition: A | Discharge status: Home Health | Payer: Self-pay | Source: Home / Self Care | Attending: Internal Medicine

## 2020-04-28 LAB — CBC
HCT: 24.3 % — ABNORMAL LOW (ref 36.0–46.0)
Hemoglobin: 8.2 g/dL — ABNORMAL LOW (ref 12.0–15.0)
MCH: 33.7 pg (ref 26.0–34.0)
MCHC: 33.7 g/dL (ref 30.0–36.0)
MCV: 100 fL (ref 80.0–100.0)
Platelets: 145 10*3/uL — ABNORMAL LOW (ref 150–400)
RBC: 2.43 MIL/uL — ABNORMAL LOW (ref 3.87–5.11)
WBC: 5.4 10*3/uL (ref 4.0–10.5)
nRBC: 0 % (ref 0.0–0.2)

## 2020-04-28 LAB — BASIC METABOLIC PANEL
Anion gap: 8 (ref 5–15)
BUN: <5 mg/dL — ABNORMAL LOW (ref 6–20)
CO2: 17 mmol/L — ABNORMAL LOW (ref 22–32)
Calcium: 7.7 mg/dL — ABNORMAL LOW (ref 8.9–10.3)
Chloride: 110 mmol/L (ref 98–111)
Creatinine, Ser: 0.91 mg/dL (ref 0.44–1.00)
GFR calc Af Amer: >60 mL/min (ref >60)
GFR calc non Af Amer: >60 mL/min (ref >60)
Glucose, Bld: 69 mg/dL — ABNORMAL LOW (ref 70–99)
Potassium: 3.8 mmol/L (ref 3.5–5.1)
Sodium: 135 mmol/L (ref 135–145)

## 2020-04-28 LAB — PROTIME-INR
INR: 3 — ABNORMAL HIGH (ref 0.8–1.2)
Prothrombin Time: 30.3 seconds — ABNORMAL HIGH (ref 11.4–15.2)

## 2020-04-28 SURGERY — ENTEROSCOPY
Anesthesia: Monitor Anesthesia Care

## 2020-04-28 MED ORDER — PANTOPRAZOLE SODIUM 40 MG PO TBEC
40.0000 mg | DELAYED_RELEASE_TABLET | Freq: Two times a day (BID) | ORAL | Status: DC
  Administered 2020-04-28 – 2020-05-01 (×7): 40 mg via ORAL
  Filled 2020-04-28 (×8): qty 1

## 2020-04-28 NOTE — Progress Notes (Signed)
Progress Note    Mary [name]  GYF:[id number] DOB: 08/15/1961  DOA: 04/23/2020 PCP: Antony Blackbird, MD    Brief Narrative:     Medical records reviewed and are as summarized below:  Mary [name] is an 59 y.o. female with a past medical history of mitral valve replacement on warfarin, history of macrocytic anemia, history of GERD who was in her usual state of health till about 4 to 5 days ago when she started developing nausea followed by onset of vomiting and diarrhea.  She denies any blood in the emesis or in the stool.  She has had profuse amount of watery stool.  Denies any recent antibiotic use.  Denies eating out anywhere recently.  She does have some abdominal discomfort associated with the symptoms.  Denies any fever or chills.  Denies any dysuria.  She does admit to acid reflux symptoms.  She had similar presentation back in April when she had to be hospitalized for dehydration.  She has never had upper endoscopy or colonoscopy.  It appears that these procedures were planned in May however patient could not find transportation and could not afford the medications needed for prep.  Assessment/Plan:   Principal Problem:   Acute gastroenteritis Active Problems:   Warfarin anticoagulation   History of rheumatic heart disease   Hyponatremia   Dehydration   Macrocytic anemia   N&V (nausea and vomiting)   H/O mitral valve replacement   Diarrhea   N/V/anemia/acute gastritits  -GI consult for endoscopies planned for when INR <1.8 (spoke with cardiology and confirmed that due to valve will need to trend down on own) -patient states that her last coumadin dose was Wednesday. Continue to await a down trend to <1.8 -symptomatic treatment -s/p 2 units of PRBCs 8/1  Hyponatremia and hypokalemia/dehydration:  -setting of GI loss -repleted and rechecked  AKI -resolved  History of rheumatic heart disease with history of mitral valve replacement with mechanical valve: Followed  by Dr. Marlou Porch with cardiology.  She is on warfarin for anticoagulation. Goal 2.5-3.5 -IV heparin with plan to bridge back to coumadin post procedure -let INR drift down naturally  macrocytic anemia:  -folic acid low- replace PO -2 units PRBC B12: 376  ? Alcohol use (in past has reported PRN wine consumption) -no sign of withdrawal   Family Communication/Anticipated D/C date and plan/Code Status   DVT prophylaxis: heparin gtt when INR <2.5 Code Status: Full Code.  Disposition Plan: Status is: Inpatient  Remains inpatient appropriate because:IV treatments appropriate due to intensity of illness or inability to take PO   Dispo: The patient is from: Home              Anticipated d/c is to: Home              Anticipated d/c date is: > 3 days              Patient currently is not medically stable to d/c. after procedures, will need bridge back to INR of 2.5-3.5-- this has been explained to patient in depth every day         Medical Consultants:    GI  Subjective:   Up walking the hallway  Objective:    Vitals:   04/27/20 1253 04/27/20 1632 04/28/20 0037 04/28/20 0512  BP: (!) 147/87 127/84 105/64 116/80  Pulse: 91 93 90 96  Resp: 18 18 16 18   Temp: 97.9 F (36.6 C) 98.3 F (36.8 C) 98.4 F (36.9  C) 98.1 F (36.7 C)  TempSrc: Oral Oral Oral Oral  SpO2: 100% 95% 97% 100%  Weight:      Height:        Intake/Output Summary (Last 24 hours) at 04/28/2020 1133 Last data filed at 04/28/2020 8786 Gross per 24 hour  Intake 590 ml  Output --  Net 590 ml   Filed Weights   04/26/20 2046  Weight: 49.8 kg    Exam:  General: Appearance:    Well developed, well nourished female in no acute distress- up walking in the hallway     Lungs:    respirations unlabored  Heart:    Normal heart rate. Normal rhythm.    MS:   All extremities are intact.   Neurologic:   Awake, alert, oriented x 3. No apparent focal neurological           defect.     Data Reviewed:   I  have personally reviewed following labs and imaging studies:  Labs: Labs show the following:   Basic Metabolic Panel: Recent Labs  Lab 04/24/20 0500 04/24/20 0500 04/25/20 0143 04/25/20 0143 04/26/20 0453 04/26/20 0453 04/27/20 0455 04/28/20 0321  NA 129*  --  135  --  136  --  136 135  K 4.7   < > 3.3*   < > 3.9   < > 3.4* 3.8  CL 94*  --  105  --  111  --  112* 110  CO2 24  --  15*  --  17*  --  16* 17*  GLUCOSE 97  --  85  --  72  --  66* 69*  BUN 10  --  7  --  4*  --  <5* <5*  CREATININE 1.72*  --  1.52*  --  1.13*  --  0.93 0.91  CALCIUM 8.0*  --  8.6*  --  8.1*  --  8.0* 7.7*  MG 1.5*  --  2.3  --   --   --   --   --    < > = values in this interval not displayed.   GFR Estimated Creatinine Clearance: 53.6 mL/min (by C-G formula based on SCr of 0.91 mg/dL). Liver Function Tests: Recent Labs  Lab 04/23/20 1219 04/24/20 0500  AST 45* 36  ALT 23 18  ALKPHOS 138* 123  BILITOT 0.9 0.8  PROT 6.2* 5.4*  ALBUMIN 3.0* 2.6*   Recent Labs  Lab 04/23/20 1219  LIPASE 15   No results for input(s): AMMONIA in the last 168 hours. Coagulation profile Recent Labs  Lab 04/24/20 0500 04/25/20 0143 04/26/20 0453 04/27/20 0455 04/28/20 0321  INR 2.0* 2.1* 2.8* 3.1* 3.0*    CBC: Recent Labs  Lab 04/24/20 0500 04/24/20 0500 04/25/20 0143 04/26/20 0453 04/26/20 2143 04/27/20 0455 04/28/20 0321  WBC 7.1  --  6.9 4.3  --  5.5 5.4  HGB 7.2*   < > 7.8* 6.0* 8.8* 8.4* 8.2*  HCT 19.5*   < > 21.2* 16.9* 25.7* 24.3* 24.3*  MCV 121.9*  --  123.3* 127.1*  --  97.6 100.0  PLT 312  --  320 218  --  152 145*   < > = values in this interval not displayed.   Cardiac Enzymes: No results for input(s): CKTOTAL, CKMB, CKMBINDEX, TROPONINI in the last 168 hours. BNP (last 3 results) No results for input(s): PROBNP in the last 8760 hours. CBG: No results for input(s): GLUCAP in the last 168  hours. D-Dimer: No results for input(s): DDIMER in the last 72 hours. Hgb A1c: No  results for input(s): HGBA1C in the last 72 hours. Lipid Profile: No results for input(s): CHOL, HDL, LDLCALC, TRIG, CHOLHDL, LDLDIRECT in the last 72 hours. Thyroid function studies: No results for input(s): TSH, T4TOTAL, T3FREE, THYROIDAB in the last 72 hours.  Invalid input(s): FREET3 Anemia work up: No results for input(s): VITAMINB12, FOLATE, FERRITIN, TIBC, IRON, RETICCTPCT in the last 72 hours. Sepsis Labs: Recent Labs  Lab 04/25/20 0143 04/26/20 0453 04/27/20 0455 04/28/20 0321  WBC 6.9 4.3 5.5 5.4    Microbiology Recent Results (from the past 240 hour(s))  SARS Coronavirus 2 by RT PCR (hospital order, performed in [facility] hospital lab) Nasopharyngeal Nasopharyngeal Swab     Status: None   Collection Time: 04/23/20  7:46 PM   Specimen: Nasopharyngeal Swab  Result Value Ref Range Status   SARS Coronavirus 2 NEGATIVE NEGATIVE Final    Comment: (NOTE) SARS-CoV-2 target nucleic acids are NOT DETECTED.  The SARS-CoV-2 RNA is generally detectable in upper and lower respiratory specimens during the acute phase of infection. The lowest concentration of SARS-CoV-2 viral copies this assay can detect is 250 copies / mL. A negative result does not preclude SARS-CoV-2 infection and should not be used as the sole basis for treatment or other patient management decisions.  A negative result may occur with improper specimen collection / handling, submission of specimen other than nasopharyngeal swab, presence of viral mutation(s) within the areas targeted by this assay, and inadequate number of viral copies (<250 copies / mL). A negative result must be combined with clinical observations, patient history, and epidemiological information.  Fact Sheet for Patients:   StrictlyIdeas.no  Fact Sheet for Healthcare Providers: BankingDealers.co.za  This test is not yet approved or  cleared by the [place] FDA and has been  authorized for detection and/or diagnosis of SARS-CoV-2 by FDA under an Emergency Use Authorization (EUA).  This EUA will remain in effect (meaning this test can be used) for the duration of the COVID-19 declaration under Section 564(b)(1) of the Act, 21 U.S.C. section 360bbb-3(b)(1), unless the authorization is terminated or revoked sooner.  Performed at [facility], [place] [address] [place], [place] [address]   Gastrointestinal Panel by PCR , Stool     Status: None   Collection Time: 04/24/20  4:55 AM   Specimen: Stool  Result Value Ref Range Status   Campylobacter species NOT DETECTED NOT DETECTED Final   Plesimonas shigelloides NOT DETECTED NOT DETECTED Final   Salmonella species NOT DETECTED NOT DETECTED Final   Yersinia enterocolitica NOT DETECTED NOT DETECTED Final   Vibrio species NOT DETECTED NOT DETECTED Final   Vibrio cholerae NOT DETECTED NOT DETECTED Final   Enteroaggregative E coli (EAEC) NOT DETECTED NOT DETECTED Final   Enteropathogenic E coli (EPEC) NOT DETECTED NOT DETECTED Final   Enterotoxigenic E coli (ETEC) NOT DETECTED NOT DETECTED Final   Shiga like toxin producing E coli (STEC) NOT DETECTED NOT DETECTED Final   Shigella/Enteroinvasive E coli (EIEC) NOT DETECTED NOT DETECTED Final   Cryptosporidium NOT DETECTED NOT DETECTED Final   Cyclospora cayetanensis NOT DETECTED NOT DETECTED Final   Entamoeba histolytica NOT DETECTED NOT DETECTED Final   Giardia lamblia NOT DETECTED NOT DETECTED Final   Adenovirus F40/41 NOT DETECTED NOT DETECTED Final   Astrovirus NOT DETECTED NOT DETECTED Final   Norovirus GI/GII NOT DETECTED NOT DETECTED Final   Rotavirus A NOT DETECTED NOT  DETECTED Final   Sapovirus (I, II, IV, and V) NOT DETECTED NOT DETECTED Final    Comment: Performed at [facility], [place], [place] [address]  C Difficile Quick Screen w PCR reflex     Status: None   Collection Time: 04/24/20  4:55 AM   Specimen: Stool  Result  Value Ref Range Status   C Diff antigen NEGATIVE NEGATIVE Final   C Diff toxin NEGATIVE NEGATIVE Final   C Diff interpretation No C. difficile detected.  Final    Comment: Performed at [facility], [place] [address] [place], [place] [address]    Procedures and diagnostic studies:  No results found.  Medications:   . sodium chloride   Intravenous Once  . Chlorhexidine Gluconate Cloth  6 each Topical Daily  . folic acid  1 mg Oral Daily  . gabapentin  300 mg Oral QHS  . pantoprazole  40 mg Oral BID  . sodium chloride flush  10-40 mL Intracatheter Q12H  . sodium chloride flush  10-40 mL Intracatheter Q12H   Continuous Infusions:    LOS: 4 days   Geradine Girt  [facility]   How to contact the [facility] Attending or Consulting provider [place] or covering provider during after hours [place], for this patient?  1. Check the care team in [facility] and look for a) attending/consulting [facility] provider listed and b) the [facility] team listed 2. Log into [email or web address] and use [place]'s universal password to access. If you do not have the password, please contact the hospital operator. 3. Locate the [facility] provider you are looking for under [facility] and page to a number that you can be directly reached. 4. If you still have difficulty reaching the provider, please page the [facility] (Director on Call) for the Hospitalists listed on amion for assistance.  04/28/2020, 11:33 AM

## 2020-04-28 NOTE — Social Work (Signed)
Appointment made for pt at [place], September 1st at 9:30am w/ Geryl Rankins. This has been added to AVS along with instructions.   Westley Hummer, MSW, [place] Work

## 2020-04-28 NOTE — Progress Notes (Signed)
ANTICOAGULATION CONSULT NOTE   Pharmacy Consult for Heparin (warfarin on hold) Indication: Mechanical MVR  Allergies  Allergen Reactions   Oxycodone Itching    Patient Measurements: 49.9 kg (110 lb)  Vital Signs: Temp: 98.1 F (36.7 C) (08/03 0512) Temp Source: Oral (08/03 0512) BP: 116/80 (08/03 0512) Pulse Rate: 96 (08/03 0512)  Labs: Recent Labs    04/25/20 2120 04/26/20 0453 04/26/20 0453 04/26/20 2143 04/26/20 2143 04/27/20 0455 04/28/20 0321  HGB  --  6.0*   < > 8.8*   < > 8.4* 8.2*  HCT  --  16.9*   < > 25.7*  --  24.3* 24.3*  PLT  --  218  --   --   --  152 145*  LABPROT  --  28.6*  --   --   --  31.3* 30.3*  INR  --  2.8*  --   --   --  3.1* 3.0*  HEPARINUNFRC 0.32  --   --   --   --   --   --   CREATININE  --  1.13*  --   --   --  0.93 0.91   < > = values in this interval not displayed.    Estimated Creatinine Clearance: 53.6 mL/min (by C-G formula based on SCr of 0.91 mg/dL).  Assessment: 59 yo female on warfarin PTA for hx of mechanical MVR, holding warfarin and starting heparin in case procedures are needed during hospital stay. INR goal PTA is 2.5-3.5 and INR is 2 so heparin was started. Patient's PTA warfarin dose is 7.5 mg MWF and 5 mg on all other days of the week. Pharmacy is consulted to dose heparin.  Patient has been on heparin drip on and off due to procedure being rescheduled twice. Pt was therapeutic on 800 units/hr. Per Dr. Eliseo Squires, start heparin drip when INR <2.5. Per gastroenterology proceed with procedure when INR is <1.8 and stop heparin drip 4-hrs prior to procedure.   INR today is 3.0. Hgb 8.2. Plt 145. No reported bleeding   Goal of Therapy:  Heparin level 0.3-0.7 units/ml Monitor platelets by anticoagulation protocol: Yes   Plan:  Continue to hold heparin drip until INR <2.5 Stop heparin drip 4-hrs prior to EGD when scheduled Monitor PT/INR and CBC Bridge to warfarin post-procedure Monitor for signs and symptoms of  bleeding  Cristela Felt, PharmD Clinical Pharmacist

## 2020-04-28 NOTE — Progress Notes (Signed)
INR 3, Hgb 8.2 today Coumadin remains on hold (hx mechanical MVR, tricuspid repair 2014) Will recheck tmrw At this point, soonest colonoscopy/enteroscopy will be Thurs 8/5.   Azucena Freed PA-C

## 2020-04-29 LAB — BASIC METABOLIC PANEL
Anion gap: 8 (ref 5–15)
BUN: 5 mg/dL — ABNORMAL LOW (ref 6–20)
CO2: 18 mmol/L — ABNORMAL LOW (ref 22–32)
Calcium: 7.8 mg/dL — ABNORMAL LOW (ref 8.9–10.3)
Chloride: 113 mmol/L — ABNORMAL HIGH (ref 98–111)
Creatinine, Ser: 0.94 mg/dL (ref 0.44–1.00)
GFR calc Af Amer: >60 mL/min (ref >60)
GFR calc non Af Amer: >60 mL/min (ref >60)
Glucose, Bld: 75 mg/dL (ref 70–99)
Potassium: 3.8 mmol/L (ref 3.5–5.1)
Sodium: 139 mmol/L (ref 135–145)

## 2020-04-29 LAB — CBC
HCT: 24.6 % — ABNORMAL LOW (ref 36.0–46.0)
Hemoglobin: 8.2 g/dL — ABNORMAL LOW (ref 12.0–15.0)
MCH: 33.2 pg (ref 26.0–34.0)
MCHC: 33.3 g/dL (ref 30.0–36.0)
MCV: 99.6 fL (ref 80.0–100.0)
Platelets: 154 K/uL (ref 150–400)
RBC: 2.47 MIL/uL — ABNORMAL LOW (ref 3.87–5.11)
RDW: 28.1 % — ABNORMAL HIGH (ref 11.5–15.5)
WBC: 5.1 K/uL (ref 4.0–10.5)
nRBC: 0 % (ref 0.0–0.2)

## 2020-04-29 LAB — PROTIME-INR
INR: 2.7 — ABNORMAL HIGH (ref 0.8–1.2)
Prothrombin Time: 27.6 seconds — ABNORMAL HIGH (ref 11.4–15.2)

## 2020-04-29 NOTE — Plan of Care (Signed)
  Problem: Education: Goal: Knowledge of General Education information will improve Description: Including pain rating scale, medication(s)/side effects and non-pharmacologic comfort measures Outcome: Progressing   Problem: Clinical Measurements: Goal: Ability to maintain clinical measurements within normal limits will improve Outcome: Progressing Goal: Cardiovascular complication will be avoided Outcome: Progressing   Problem: Activity: Goal: Risk for activity intolerance will decrease Outcome: Progressing   Problem: Nutrition: Goal: Adequate nutrition will be maintained Outcome: Progressing   

## 2020-04-29 NOTE — Progress Notes (Signed)
[facility] for Heparin (warfarin on hold) Indication: Mechanical MVR  Allergies  Allergen Reactions  . Oxycodone Itching    Patient Measurements: 49.9 kg (110 lb)  Vital Signs: Temp: 98.2 F (36.8 C) (08/04 0413) Temp Source: Oral (08/04 0413) BP: 129/79 (08/04 0413) Pulse Rate: 87 (08/04 0413)  Labs: Recent Labs    04/27/20 0455 04/27/20 0455 04/28/20 0321 04/29/20 0410  HGB 8.4*   < > 8.2* 8.2*  HCT 24.3*  --  24.3* 24.6*  PLT 152  --  145* 154  LABPROT 31.3*  --  30.3* 27.6*  INR 3.1*  --  3.0* 2.7*  CREATININE 0.93  --  0.91 0.94   < > = values in this interval not displayed.    Estimated Creatinine Clearance: 51.9 mL/min (by C-G formula based on SCr of 0.94 mg/dL).  Assessment: 59 yo female on warfarin PTA for hx of mechanical MVR, holding warfarin and starting heparin in case procedures are needed during hospital stay. INR goal PTA is 2.5-3.5 and INR is 2 so heparin was started. Patient's PTA warfarin dose is 7.5 mg MWF and 5 mg on all other days of the week. Pharmacy is consulted to dose heparin.  Patient has been on heparin drip on and off due to procedure being rescheduled twice. Pt was therapeutic on 800 units/hr. Per Dr. Eliseo Squires, start heparin drip when INR <2.5. Per gastroenterology proceed with procedure when INR is <1.8 and stop heparin drip 4-hrs prior to procedure.   INR today is 2.7 is therapeutic and continues to trend down slowly with warfarin held. Hgb 8.2. Plt 154. No reported bleeding.   Goal of Therapy:  INR goal 2.5 - 3.5  Heparin level 0.3-0.7 units/ml Monitor platelets by anticoagulation protocol: Yes   Plan:  Continue to hold heparin drip until INR <2.5 Stop heparin drip 4-hrs prior to EGD when scheduled Monitor PT/INR and CBC Bridge to warfarin post-procedure Monitor for signs and symptoms of bleeding  Cristela Felt, PharmD Clinical Pharmacist

## 2020-04-29 NOTE — Progress Notes (Signed)
INR 2.7.  Needs to be less than 2 in order to proceed w colonoscopy, enterocsocopy No Vit K, allow INR to naturally correct, per cardiology.    Azucena Freed PA-C

## 2020-04-29 NOTE — Progress Notes (Signed)
PROGRESS NOTE    Mary [name]  GLO:[id number] DOB: November 12, 1960 DOA: 04/23/2020 PCP: Cain Saupe, MD    Brief Narrative:  Mary [name] is an 59 y.o. female with a past medical history of mitral valve replacement on warfarin, history of macrocytic anemia, history of GERD who was in her usual state of health till about 4 to 5 days ago when she started developing nausea followed by onset of vomiting and diarrhea. She denies any blood in the emesis or in the stool. She has had profuse amount of watery stool. Denies any recent antibiotic use. Denies eating out anywhere recently. She does have some abdominal discomfort associated with the symptoms. Denies any fever or chills. Denies any dysuria. She does admit to acid reflux symptoms. She had similar presentation back in April when she had to be hospitalized for dehydration. She has never had upper endoscopy or colonoscopy. It appears that these procedures were planned in May however patient could not find transportation and could not afford the medications needed for prep.   Assessment & Plan:   Principal Problem:   Acute gastroenteritis Active Problems:   Warfarin anticoagulation   History of rheumatic heart disease   Hyponatremia   Dehydration   Macrocytic anemia   N&V (nausea and vomiting)   H/O mitral valve replacement   Diarrhea   Anemia likely secondary to acute gastritis Patient presenting following 4-5 days acute nausea/vomiting.  Was pending EGD/colonoscopy in May but could not afford bowel prep or find transportation. --Gastroenterology following, appreciate assistance --s/p 2u PRBC 8/1 --Protonix 40 mg p.o. twice daily --Hgb 6.0>8.8>8.4>8.2  --Plan EGD/colonoscopy once INR less than 2.0 --Continue to monitor hemoglobin  Hyponatremia Hypokalemia Etiology likely hypovolemic in the setting of GI loss with nausea/vomiting.  Repleted.  Sodium 139 with potassium 3.8 today. --Continue to monitor electrolytes  closely  Macrocytic anemia B12 376, folic acid 3.7, iron 168, TIBC 193, ferritin 186. --s/p 2u PRBC on 04/26/2020 --Continue folic acid 1 mg p.o. daily  Acute renal failure: Resolved Creatinine 1.81 on admission, likely secondary to prerenal azotemia from dehydration versus ATN from acute blood loss anemia.  Patient receives PRBCs, IV fluids with improvement of creatinine to 0.94.  History of rheumatic heart disease status post mechanical mitral valve replacement --On Coumadin outpatient with a goal INR 2.5-3.5 --Continue to hold home Coumadin for planned EGD/colonoscopy, per cardiology no reversal indicated and allow INR to trend down on its own --Continue heparin drip    DVT prophylaxis: Heparin drip Code Status: Full code Family Communication: No family present at bedside this morning  Disposition Plan:  Status is: Inpatient  Remains inpatient appropriate because:Ongoing diagnostic testing needed not appropriate for outpatient work up and IV treatments appropriate due to intensity of illness or inability to take PO   Dispo: The patient is from: Home              Anticipated d/c is to: Home              Anticipated d/c date is: 3 days              Patient currently is not medically stable to d/c.  Consultants:   [facility] - Dr. Christella Hartigan  Procedures:   none  Antimicrobials:   none   Subjective: Patient seen and examined bedside, resting comfortably.  No complaints this morning.  INR down to 2.7 today.  Still awaiting INR to be less than 2.0 for GI to perform endoscopy.  Denies any  hematemesis or blood in her stool today.  Denies headache, no visual changes, no chest pain, no dizziness, no palpitations, no shortness of breath, no abdominal pain.  No acute events overnight per nursing staff.  Objective: Vitals:   04/28/20 1316 04/28/20 1610 04/29/20 0413 04/29/20 1210  BP: 124/81 128/84 129/79 (!) 120/94  Pulse: (!) 101 96 87 89  Resp: 18 18 20 16   Temp: 98.1 F  (36.7 C) 98.1 F (36.7 C) 98.2 F (36.8 C) 98.1 F (36.7 C)  TempSrc: Oral Oral Oral Oral  SpO2: 100% 100% 100% 100%  Weight:      Height:       No intake or output data in the 24 hours ending 04/29/20 1650 Filed Weights   04/26/20 2046  Weight: 49.8 kg    Examination:  General exam: Appears calm and comfortable  Respiratory system: Clear to auscultation. Respiratory effort normal. Cardiovascular system: S1 & S2 heard, RRR. No JVD, murmurs, rubs, gallops or clicks. No pedal edema. Gastrointestinal system: Abdomen is nondistended, soft and nontender. No organomegaly or masses felt. Normal bowel sounds heard. Central nervous system: Alert and oriented. No focal neurological deficits. Extremities: Symmetric 5 x 5 power. Skin: No rashes, lesions or ulcers Psychiatry: Judgement and insight appear normal. Mood & affect appropriate.     Data Reviewed: I have personally reviewed following labs and imaging studies  CBC: Recent Labs  Lab 04/25/20 0143 04/25/20 0143 04/26/20 0453 04/26/20 2143 04/27/20 0455 04/28/20 0321 04/29/20 0410  WBC 6.9  --  4.3  --  5.5 5.4 5.1  HGB 7.8*   < > 6.0* 8.8* 8.4* 8.2* 8.2*  HCT 21.2*   < > 16.9* 25.7* 24.3* 24.3* 24.6*  MCV 123.3*  --  127.1*  --  97.6 100.0 99.6  PLT 320  --  218  --  152 145* 154   < > = values in this interval not displayed.   Basic Metabolic Panel: Recent Labs  Lab 04/24/20 0500 04/24/20 0500 04/25/20 0143 04/26/20 0453 04/27/20 0455 04/28/20 0321 04/29/20 0410  NA 129*   < > 135 136 136 135 139  K 4.7   < > 3.3* 3.9 3.4* 3.8 3.8  CL 94*   < > 105 111 112* 110 113*  CO2 24   < > 15* 17* 16* 17* 18*  GLUCOSE 97   < > 85 72 66* 69* 75  BUN 10   < > 7 4* <5* <5* 5*  CREATININE 1.72*   < > 1.52* 1.13* 0.93 0.91 0.94  CALCIUM 8.0*   < > 8.6* 8.1* 8.0* 7.7* 7.8*  MG 1.5*  --  2.3  --   --   --   --    < > = values in this interval not displayed.   GFR: Estimated Creatinine Clearance: 51.9 mL/min (by C-G  formula based on SCr of 0.94 mg/dL). Liver Function Tests: Recent Labs  Lab 04/23/20 1219 04/24/20 0500  AST 45* 36  ALT 23 18  ALKPHOS 138* 123  BILITOT 0.9 0.8  PROT 6.2* 5.4*  ALBUMIN 3.0* 2.6*   Recent Labs  Lab 04/23/20 1219  LIPASE 15   No results for input(s): AMMONIA in the last 168 hours. Coagulation Profile: Recent Labs  Lab 04/25/20 0143 04/26/20 0453 04/27/20 0455 04/28/20 0321 04/29/20 0410  INR 2.1* 2.8* 3.1* 3.0* 2.7*   Cardiac Enzymes: No results for input(s): CKTOTAL, CKMB, CKMBINDEX, TROPONINI in the last 168 hours. BNP (last 3 results) No  results for input(s): PROBNP in the last 8760 hours. HbA1C: No results for input(s): HGBA1C in the last 72 hours. CBG: No results for input(s): GLUCAP in the last 168 hours. Lipid Profile: No results for input(s): CHOL, HDL, LDLCALC, TRIG, CHOLHDL, LDLDIRECT in the last 72 hours. Thyroid Function Tests: No results for input(s): TSH, T4TOTAL, FREET4, T3FREE, THYROIDAB in the last 72 hours. Anemia Panel: No results for input(s): VITAMINB12, FOLATE, FERRITIN, TIBC, IRON, RETICCTPCT in the last 72 hours. Sepsis Labs: No results for input(s): PROCALCITON, LATICACIDVEN in the last 168 hours.  Recent Results (from the past 240 hour(s))  SARS Coronavirus 2 by RT PCR (hospital order, performed in [facility] hospital lab) Nasopharyngeal Nasopharyngeal Swab     Status: None   Collection Time: 04/23/20  7:46 PM   Specimen: Nasopharyngeal Swab  Result Value Ref Range Status   SARS Coronavirus 2 NEGATIVE NEGATIVE Final    Comment: (NOTE) SARS-CoV-2 target nucleic acids are NOT DETECTED.  The SARS-CoV-2 RNA is generally detectable in upper and lower respiratory specimens during the acute phase of infection. The lowest concentration of SARS-CoV-2 viral copies this assay can detect is 250 copies / mL. A negative result does not preclude SARS-CoV-2 infection and should not be used as the sole basis for treatment or  other patient management decisions.  A negative result may occur with improper specimen collection / handling, submission of specimen other than nasopharyngeal swab, presence of viral mutation(s) within the areas targeted by this assay, and inadequate number of viral copies (<250 copies / mL). A negative result must be combined with clinical observations, patient history, and epidemiological information.  Fact Sheet for Patients:   BoilerBrush.com.cy  Fact Sheet for Healthcare Providers: https://pope.com/  This test is not yet approved or  cleared by the [place] FDA and has been authorized for detection and/or diagnosis of SARS-CoV-2 by FDA under an Emergency Use Authorization (EUA).  This EUA will remain in effect (meaning this test can be used) for the duration of the COVID-19 declaration under Section 564(b)(1) of the Act, 21 U.S.C. section 360bbb-3(b)(1), unless the authorization is terminated or revoked sooner.  Performed at [facility], [address] [place], [place] [address]   Gastrointestinal Panel by PCR , Stool     Status: None   Collection Time: 04/24/20  4:55 AM   Specimen: Stool  Result Value Ref Range Status   Campylobacter species NOT DETECTED NOT DETECTED Final   Plesimonas shigelloides NOT DETECTED NOT DETECTED Final   Salmonella species NOT DETECTED NOT DETECTED Final   Yersinia enterocolitica NOT DETECTED NOT DETECTED Final   Vibrio species NOT DETECTED NOT DETECTED Final   Vibrio cholerae NOT DETECTED NOT DETECTED Final   Enteroaggregative E coli (EAEC) NOT DETECTED NOT DETECTED Final   Enteropathogenic E coli (EPEC) NOT DETECTED NOT DETECTED Final   Enterotoxigenic E coli (ETEC) NOT DETECTED NOT DETECTED Final   Shiga like toxin producing E coli (STEC) NOT DETECTED NOT DETECTED Final   Shigella/Enteroinvasive E coli (EIEC) NOT DETECTED NOT DETECTED Final   Cryptosporidium NOT DETECTED NOT  DETECTED Final   Cyclospora cayetanensis NOT DETECTED NOT DETECTED Final   Entamoeba histolytica NOT DETECTED NOT DETECTED Final   Giardia lamblia NOT DETECTED NOT DETECTED Final   Adenovirus F40/41 NOT DETECTED NOT DETECTED Final   Astrovirus NOT DETECTED NOT DETECTED Final   Norovirus GI/GII NOT DETECTED NOT DETECTED Final   Rotavirus A NOT DETECTED NOT DETECTED Final   Sapovirus (I, II,  IV, and V) NOT DETECTED NOT DETECTED Final    Comment: Performed at [facility], [address] [place], [place] [address]  C Difficile Quick Screen w PCR reflex     Status: None   Collection Time: 04/24/20  4:55 AM   Specimen: Stool  Result Value Ref Range Status   C Diff antigen NEGATIVE NEGATIVE Final   C Diff toxin NEGATIVE NEGATIVE Final   C Diff interpretation No C. difficile detected.  Final    Comment: Performed at [facility], [address] [place], [place] [address]         Radiology Studies: No results found.      Scheduled Meds: . sodium chloride   Intravenous Once  . Chlorhexidine Gluconate Cloth  6 each Topical Daily  . folic acid  1 mg Oral Daily  . gabapentin  300 mg Oral QHS  . pantoprazole  40 mg Oral BID  . sodium chloride flush  10-40 mL Intracatheter Q12H  . sodium chloride flush  10-40 mL Intracatheter Q12H   Continuous Infusions:   LOS: 5 days    Time spent: 39 minutes spent on chart review, discussion with nursing staff, consultants, updating family and interview/physical exam; more than 50% of that time was spent in counseling and/or coordination of care.    Alvira Philips [place], DO [facility] Available via Epic secure chat 7am-7pm After these hours, please refer to coverage provider listed on [email or web address] 04/29/2020, 4:50 PM

## 2020-04-30 DIAGNOSIS — K529 Noninfective gastroenteritis and colitis, unspecified: Secondary | ICD-10-CM

## 2020-04-30 LAB — CBC
HCT: 24.4 % — ABNORMAL LOW (ref 36.0–46.0)
Hemoglobin: 8.3 g/dL — ABNORMAL LOW (ref 12.0–15.0)
MCH: 34.3 pg — ABNORMAL HIGH (ref 26.0–34.0)
MCHC: 34 g/dL (ref 30.0–36.0)
MCV: 100.8 fL — ABNORMAL HIGH (ref 80.0–100.0)
Platelets: 189 10*3/uL (ref 150–400)
RBC: 2.42 MIL/uL — ABNORMAL LOW (ref 3.87–5.11)
RDW: 27.9 % — ABNORMAL HIGH (ref 11.5–15.5)
WBC: 5.5 10*3/uL (ref 4.0–10.5)
nRBC: 0 % (ref 0.0–0.2)

## 2020-04-30 LAB — HEPARIN LEVEL (UNFRACTIONATED): Heparin Unfractionated: 0.26 IU/mL — ABNORMAL LOW (ref 0.30–0.70)

## 2020-04-30 LAB — PROTIME-INR
INR: 2.1 — ABNORMAL HIGH (ref 0.8–1.2)
Prothrombin Time: 22.7 seconds — ABNORMAL HIGH (ref 11.4–15.2)

## 2020-04-30 MED ORDER — METOCLOPRAMIDE HCL 5 MG/ML IJ SOLN
10.0000 mg | Freq: Once | INTRAMUSCULAR | Status: AC
  Administered 2020-05-01: 10 mg via INTRAVENOUS
  Filled 2020-04-30: qty 2

## 2020-04-30 MED ORDER — PEG-KCL-NACL-NASULF-NA ASC-C 100 G PO SOLR
0.5000 | Freq: Once | ORAL | Status: AC
  Administered 2020-05-01: 100 g via ORAL
  Filled 2020-04-30: qty 1

## 2020-04-30 MED ORDER — BISACODYL 5 MG PO TBEC
20.0000 mg | DELAYED_RELEASE_TABLET | Freq: Once | ORAL | Status: AC
  Administered 2020-04-30: 20 mg via ORAL
  Filled 2020-04-30: qty 4

## 2020-04-30 MED ORDER — PEG-KCL-NACL-NASULF-NA ASC-C 100 G PO SOLR
0.5000 | Freq: Once | ORAL | Status: AC
  Administered 2020-04-30: 100 g via ORAL
  Filled 2020-04-30: qty 1

## 2020-04-30 MED ORDER — PEG-KCL-NACL-NASULF-NA ASC-C 100 G PO SOLR
1.0000 | Freq: Once | ORAL | Status: DC
  Filled 2020-04-30: qty 1

## 2020-04-30 MED ORDER — METOCLOPRAMIDE HCL 5 MG/ML IJ SOLN
10.0000 mg | Freq: Once | INTRAMUSCULAR | Status: AC
  Administered 2020-04-30: 10 mg via INTRAVENOUS
  Filled 2020-04-30: qty 2

## 2020-04-30 MED ORDER — HEPARIN (PORCINE) 25000 UT/250ML-% IV SOLN
850.0000 [IU]/h | INTRAVENOUS | Status: AC
  Administered 2020-04-30: 800 [IU]/h via INTRAVENOUS
  Filled 2020-04-30: qty 250

## 2020-04-30 NOTE — H&P (View-Only) (Signed)
          Daily Rounding Note  04/30/2020, 11:43 AM  LOS: 6 days   SUBJECTIVE:   Chief complaint:  Anemia, n/v/d   Ate collard greens last night.  Stools loose, no blood or melena.  No nausea, no abd pain.  Feels good but stir crazy after 1 week admission  OBJECTIVE:         Vital signs in last 24 hours:    Temp:  [97.7 F (36.5 C)-98.4 F (36.9 C)] 98.1 F (36.7 C) (08/05 0502) Pulse Rate:  [89-98] 95 (08/05 0502) Resp:  [16-18] 18 (08/05 0502) BP: (119-132)/(69-94) 132/82 (08/05 0502) SpO2:  [100 %] 100 % (08/05 0502) Last BM Date: 04/27/20 Filed Weights   04/26/20 2046  Weight: 49.8 kg   General: NAD.     Heart: RRR Chest: clear bil.  No dyspnea Abdomen: soft, NT, ND.  Active BS  Extremities: no CCE Neuro/Psych:  Alert.  Oriented x 3.  Animated, in good spirits.    Intake/Output from previous day: No intake/output data recorded.  Intake/Output this shift: No intake/output data recorded.  Lab Results: Recent Labs    04/28/20 0321 04/29/20 0410 04/30/20 0429  WBC 5.4 5.1 5.5  HGB 8.2* 8.2* 8.3*  HCT 24.3* 24.6* 24.4*  PLT 145* 154 189   BMET Recent Labs    04/28/20 0321 04/29/20 0410  NA 135 139  K 3.8 3.8  CL 110 113*  CO2 17* 18*  GLUCOSE 69* 75  BUN <5* 5*  CREATININE 0.91 0.94  CALCIUM 7.7* 7.8*   LFT No results for input(s): PROT, ALBUMIN, AST, ALT, ALKPHOS, BILITOT, BILIDIR, IBILI in the last 72 hours. PT/INR Recent Labs    04/29/20 0410 04/30/20 0429  LABPROT 27.6* 22.7*  INR 2.7* 2.1*   Hepatitis Panel No results for input(s): HEPBSAG, HCVAB, HEPAIGM, HEPBIGM in the last 72 hours.  Studies/Results: No results found.  ASSESMENT:   *   Anemia. N/V/D No iron, b12 or folate deficiency.   Hgb stable at 8.3.    *   Chronic Coumadin for mechanical MVR.  Slow normalization of INR delayed plans for EGD/Colonoscopy INR 2 today Heparin gtt initiated 9 AM today.  Will need held for 4  hours before egd   PLAN   *   Plan colon, EGD tmrw at 1315.  Split dose movi prep. Hold Heparin at 0930 tmrw AM.    Pt aware, she completed same prep several d ago.       Azucena Freed  04/30/2020, 11:43 AM Phone [phone]   ________________________________________________________________________  Velora Heckler GI MD note:  I personally examined the patient, reviewed the data and agree with the assessment and plan described above.  We are hoping that her INR will be adequate for tomorrow colonsocopy/egd, prepping again tonight.   Owens Loffler, MD [facility] Pager [phone]

## 2020-04-30 NOTE — Progress Notes (Signed)
[place] for Heparin (warfarin on hold) Indication: Mechanical MVR  Allergies  Allergen Reactions  . Oxycodone Itching    Patient Measurements: 49.9 kg (110 lb)  Vital Signs: Temp: 98.1 F (36.7 C) (08/05 0502) Temp Source: Oral (08/05 0502) BP: 132/82 (08/05 0502) Pulse Rate: 95 (08/05 0502)  Labs: Recent Labs    04/28/20 0321 04/28/20 0321 04/29/20 0410 04/30/20 0429  HGB 8.2*   < > 8.2* 8.3*  HCT 24.3*  --  24.6* 24.4*  PLT 145*  --  154 189  LABPROT 30.3*  --  27.6* 22.7*  INR 3.0*  --  2.7* 2.1*  CREATININE 0.91  --  0.94  --    < > = values in this interval not displayed.    Estimated Creatinine Clearance: 51.9 mL/min (by C-G formula based on SCr of 0.94 mg/dL).  Assessment: 59 yo female on warfarin PTA for hx of mechanical MVR, holding warfarin and starting heparin in case procedures are needed during hospital stay. INR goal PTA is 2.5-3.5 and INR is 2 so heparin was started. Patient's PTA warfarin dose is 7.5 mg MWF and 5 mg on all other days of the week. Pharmacy is consulted to dose heparin.  Patient has been on heparin drip on and off due to procedure being rescheduled twice. Pt was therapeutic on 800 units/hr. Per Dr. Eliseo Squires, start heparin drip when INR <2.5. Per gastroenterology proceed with procedure when INR is <1.8 and stop heparin drip 4-hrs prior to procedure.   INR today is 2.1 and subtherapeutic and continues to trend down slowly with warfarin held. Hgb 8.3. Plt 189. No reported bleeding.   Goal of Therapy:  INR goal 2.5 - 3.5  Heparin level 0.3-0.7 units/ml Monitor platelets by anticoagulation protocol: Yes   Plan:  Heparin drip at 800 units/hr, no bolus Heparin level in 6 hours Stop heparin drip 4-hrs prior to EGD when scheduled Monitor PT/INR and CBC and daily heparin level.  Bridge to warfarin post-procedure Monitor for signs and symptoms of bleeding  Kenitra Leventhal A. Levada Dy, PharmD, BCPS, FNKF Clinical  Pharmacist  Please utilize Amion for appropriate phone number to reach the unit pharmacist ([place])

## 2020-04-30 NOTE — Progress Notes (Addendum)
          Daily Rounding Note  04/30/2020, 11:43 AM  LOS: 6 days   SUBJECTIVE:   Chief complaint:  Anemia, n/v/d   Ate collard greens last night.  Stools loose, no blood or melena.  No nausea, no abd pain.  Feels good but stir crazy after 1 week admission  OBJECTIVE:         Vital signs in last 24 hours:    Temp:  [97.7 F (36.5 C)-98.4 F (36.9 C)] 98.1 F (36.7 C) (08/05 0502) Pulse Rate:  [89-98] 95 (08/05 0502) Resp:  [16-18] 18 (08/05 0502) BP: (119-132)/(69-94) 132/82 (08/05 0502) SpO2:  [100 %] 100 % (08/05 0502) Last BM Date: 04/27/20 Filed Weights   04/26/20 2046  Weight: 49.8 kg   General: NAD.     Heart: RRR Chest: clear bil.  No dyspnea Abdomen: soft, NT, ND.  Active BS  Extremities: no CCE Neuro/Psych:  Alert.  Oriented x 3.  Animated, in good spirits.    Intake/Output from previous day: No intake/output data recorded.  Intake/Output this shift: No intake/output data recorded.  Lab Results: Recent Labs    04/28/20 0321 04/29/20 0410 04/30/20 0429  WBC 5.4 5.1 5.5  HGB 8.2* 8.2* 8.3*  HCT 24.3* 24.6* 24.4*  PLT 145* 154 189   BMET Recent Labs    04/28/20 0321 04/29/20 0410  NA 135 139  K 3.8 3.8  CL 110 113*  CO2 17* 18*  GLUCOSE 69* 75  BUN <5* 5*  CREATININE 0.91 0.94  CALCIUM 7.7* 7.8*   LFT No results for input(s): PROT, ALBUMIN, AST, ALT, ALKPHOS, BILITOT, BILIDIR, IBILI in the last 72 hours. PT/INR Recent Labs    04/29/20 0410 04/30/20 0429  LABPROT 27.6* 22.7*  INR 2.7* 2.1*   Hepatitis Panel No results for input(s): HEPBSAG, HCVAB, HEPAIGM, HEPBIGM in the last 72 hours.  Studies/Results: No results found.  ASSESMENT:   *   Anemia. N/V/D No iron, b12 or folate deficiency.   Hgb stable at 8.3.    *   Chronic Coumadin for mechanical MVR.  Slow normalization of INR delayed plans for EGD/Colonoscopy INR 2 today Heparin gtt initiated 9 AM today.  Will need held for 4  hours before egd   PLAN   *   Plan colon, EGD tmrw at 1315.  Split dose movi prep. Hold Heparin at 0930 tmrw AM.    Pt aware, she completed same prep several d ago.       Azucena Freed  04/30/2020, 11:43 AM Phone [phone]   ________________________________________________________________________  Velora Heckler GI MD note:  I personally examined the patient, reviewed the data and agree with the assessment and plan described above.  We are hoping that her INR will be adequate for tomorrow colonsocopy/egd, prepping again tonight.   Owens Loffler, MD [facility] Pager [phone]

## 2020-04-30 NOTE — Progress Notes (Signed)
PROGRESS NOTE    Mary BANDT  [name]:[id number] DOB: 1961/05/22 DOA: 04/23/2020 PCP: Cain Saupe, MD    Brief Narrative:  Mary [name] is an 59 y.o. female with a past medical history of mitral valve replacement on warfarin, history of macrocytic anemia, history of GERD who was in her usual state of health till about 4 to 5 days ago when she started developing nausea followed by onset of vomiting and diarrhea. She denies any blood in the emesis or in the stool. She has had profuse amount of watery stool. Denies any recent antibiotic use. Denies eating out anywhere recently. She does have some abdominal discomfort associated with the symptoms. Denies any fever or chills. Denies any dysuria. She does admit to acid reflux symptoms. She had similar presentation back in April when she had to be hospitalized for dehydration. She has never had upper endoscopy or colonoscopy. It appears that these procedures were planned in May however patient could not find transportation and could not afford the medications needed for prep.   Assessment & Plan:   Principal Problem:   Acute gastroenteritis Active Problems:   Warfarin anticoagulation   History of rheumatic heart disease   Hyponatremia   Dehydration   Macrocytic anemia   N&V (nausea and vomiting)   H/O mitral valve replacement   Diarrhea   Anemia likely secondary to acute gastritis Patient presenting following 4-5 days acute nausea/vomiting.  Was pending EGD/colonoscopy in May but could not afford bowel prep or find transportation. --Gastroenterology following, appreciate assistance --s/p 2u PRBC 8/1 --Protonix 40 mg p.o. twice daily --Hgb 6.0>8.8>8.4>8.2>8.3 --Plan EGD/colonoscopy tomorrow at 1315 --Continue to monitor hemoglobin  Hyponatremia: resolved  Hypokalemia: Resolved Etiology likely hypovolemic in the setting of GI loss with nausea/vomiting.  Repleted.  Sodium 139 with potassium 3.8 today. --Continue to monitor  electrolytes closely  Macrocytic anemia B12 376, folic acid 3.7, iron 168, TIBC 193, ferritin 186. --s/p 2u PRBC on 04/26/2020 --Continue folic acid 1 mg p.o. daily  Acute renal failure: Resolved Creatinine 1.81 on admission, likely secondary to prerenal azotemia from dehydration versus ATN from acute blood loss anemia.  Patient receives PRBCs, IV fluids with improvement of creatinine to 0.94.  History of rheumatic heart disease status post mechanical mitral valve replacement --On Coumadin outpatient with a goal INR 2.5-3.5 --INR 2.7>2.1 today --Continue to hold home Coumadin for planned EGD/colonoscopy, per cardiology no reversal indicated and allow INR to trend down on its own --Continue heparin drip, will need to be held 4 hours (0900) prior to scheduled EGD/colonoscopy tomorrow scheduled at 1315    DVT prophylaxis: Heparin drip Code Status: Full code Family Communication: No family present at bedside this morning  Disposition Plan:  Status is: Inpatient  Remains inpatient appropriate because:Ongoing diagnostic testing needed not appropriate for outpatient work up and IV treatments appropriate due to intensity of illness or inability to take PO   Dispo: The patient is from: Home              Anticipated d/c is to: Home              Anticipated d/c date is: 3 days              Patient currently is not medically stable to d/c.  Consultants:   [facility] - Dr. Christella Hartigan  Procedures:   none  Antimicrobials:   none   Subjective: Patient seen and examined bedside, resting comfortably.  No complaints this morning.  INR down to  2.1 today.  GI plans EGD/colonoscopy tomorrow; will need heparin drip held 4 hours prior to scheduled procedure at 1315.  Patient son present at bedside.  No other complaints or concerns at this time.  Denies headache, no visual changes, no chest pain, no dizziness, no palpitations, no shortness of breath, no abdominal pain.  No acute events overnight per  nursing staff.  Objective: Vitals:   04/29/20 1210 04/29/20 1824 04/29/20 2147 04/30/20 0502  BP: (!) 120/94 119/69 132/90 132/82  Pulse: 89 98 95 95  Resp: 16 16 18 18   Temp: 98.1 F (36.7 C) 98.4 F (36.9 C) 97.7 F (36.5 C) 98.1 F (36.7 C)  TempSrc: Oral Oral Oral Oral  SpO2: 100%  100% 100%  Weight:      Height:       No intake or output data in the 24 hours ending 04/30/20 1206 Filed Weights   04/26/20 2046  Weight: 49.8 kg    Examination:  General exam: Appears calm and comfortable  Respiratory system: Clear to auscultation. Respiratory effort normal. Cardiovascular system: S1 & S2 heard, RRR. No JVD, murmurs, rubs, gallops or clicks. No pedal edema. Gastrointestinal system: Abdomen is nondistended, soft and nontender. No organomegaly or masses felt. Normal bowel sounds heard. Central nervous system: Alert and oriented. No focal neurological deficits. Extremities: Symmetric 5 x 5 power. Skin: No rashes, lesions or ulcers Psychiatry: Judgement and insight appear normal. Mood & affect appropriate.     Data Reviewed: I have personally reviewed following labs and imaging studies  CBC: Recent Labs  Lab 04/26/20 0453 04/26/20 0453 04/26/20 2143 04/27/20 0455 04/28/20 0321 04/29/20 0410 04/30/20 0429  WBC 4.3  --   --  5.5 5.4 5.1 5.5  HGB 6.0*   < > 8.8* 8.4* 8.2* 8.2* 8.3*  HCT 16.9*   < > 25.7* 24.3* 24.3* 24.6* 24.4*  MCV 127.1*  --   --  97.6 100.0 99.6 100.8*  PLT 218  --   --  152 145* 154 189   < > = values in this interval not displayed.   Basic Metabolic Panel: Recent Labs  Lab 04/24/20 0500 04/24/20 0500 04/25/20 0143 04/26/20 0453 04/27/20 0455 04/28/20 0321 04/29/20 0410  NA 129*   < > 135 136 136 135 139  K 4.7   < > 3.3* 3.9 3.4* 3.8 3.8  CL 94*   < > 105 111 112* 110 113*  CO2 24   < > 15* 17* 16* 17* 18*  GLUCOSE 97   < > 85 72 66* 69* 75  BUN 10   < > 7 4* <5* <5* 5*  CREATININE 1.72*   < > 1.52* 1.13* 0.93 0.91 0.94  CALCIUM  8.0*   < > 8.6* 8.1* 8.0* 7.7* 7.8*  MG 1.5*  --  2.3  --   --   --   --    < > = values in this interval not displayed.   GFR: Estimated Creatinine Clearance: 51.9 mL/min (by C-G formula based on SCr of 0.94 mg/dL). Liver Function Tests: Recent Labs  Lab 04/23/20 1219 04/24/20 0500  AST 45* 36  ALT 23 18  ALKPHOS 138* 123  BILITOT 0.9 0.8  PROT 6.2* 5.4*  ALBUMIN 3.0* 2.6*   Recent Labs  Lab 04/23/20 1219  LIPASE 15   No results for input(s): AMMONIA in the last 168 hours. Coagulation Profile: Recent Labs  Lab 04/26/20 0453 04/27/20 0455 04/28/20 0321 04/29/20 0410 04/30/20 0429  INR  2.8* 3.1* 3.0* 2.7* 2.1*   Cardiac Enzymes: No results for input(s): CKTOTAL, CKMB, CKMBINDEX, TROPONINI in the last 168 hours. BNP (last 3 results) No results for input(s): PROBNP in the last 8760 hours. HbA1C: No results for input(s): HGBA1C in the last 72 hours. CBG: No results for input(s): GLUCAP in the last 168 hours. Lipid Profile: No results for input(s): CHOL, HDL, LDLCALC, TRIG, CHOLHDL, LDLDIRECT in the last 72 hours. Thyroid Function Tests: No results for input(s): TSH, T4TOTAL, FREET4, T3FREE, THYROIDAB in the last 72 hours. Anemia Panel: No results for input(s): VITAMINB12, FOLATE, FERRITIN, TIBC, IRON, RETICCTPCT in the last 72 hours. Sepsis Labs: No results for input(s): PROCALCITON, LATICACIDVEN in the last 168 hours.  Recent Results (from the past 240 hour(s))  SARS Coronavirus 2 by RT PCR (hospital order, performed in [facility] hospital lab) Nasopharyngeal Nasopharyngeal Swab     Status: None   Collection Time: 04/23/20  7:46 PM   Specimen: Nasopharyngeal Swab  Result Value Ref Range Status   SARS Coronavirus 2 NEGATIVE NEGATIVE Final    Comment: (NOTE) SARS-CoV-2 target nucleic acids are NOT DETECTED.  The SARS-CoV-2 RNA is generally detectable in upper and lower respiratory specimens during the acute phase of infection. The lowest concentration of  SARS-CoV-2 viral copies this assay can detect is 250 copies / mL. A negative result does not preclude SARS-CoV-2 infection and should not be used as the sole basis for treatment or other patient management decisions.  A negative result may occur with improper specimen collection / handling, submission of specimen other than nasopharyngeal swab, presence of viral mutation(s) within the areas targeted by this assay, and inadequate number of viral copies (<250 copies / mL). A negative result must be combined with clinical observations, patient history, and epidemiological information.  Fact Sheet for Patients:   BoilerBrush.com.cy  Fact Sheet for Healthcare Providers: https://pope.com/  This test is not yet approved or  cleared by the [place] FDA and has been authorized for detection and/or diagnosis of SARS-CoV-2 by FDA under an Emergency Use Authorization (EUA).  This EUA will remain in effect (meaning this test can be used) for the duration of the COVID-19 declaration under Section 564(b)(1) of the Act, 21 U.S.C. section 360bbb-3(b)(1), unless the authorization is terminated or revoked sooner.  Performed at [facility], [address], [place], [place] [address]   Gastrointestinal Panel by PCR , Stool     Status: None   Collection Time: 04/24/20  4:55 AM   Specimen: Stool  Result Value Ref Range Status   Campylobacter species NOT DETECTED NOT DETECTED Final   Plesimonas shigelloides NOT DETECTED NOT DETECTED Final   Salmonella species NOT DETECTED NOT DETECTED Final   Yersinia enterocolitica NOT DETECTED NOT DETECTED Final   Vibrio species NOT DETECTED NOT DETECTED Final   Vibrio cholerae NOT DETECTED NOT DETECTED Final   Enteroaggregative E coli (EAEC) NOT DETECTED NOT DETECTED Final   Enteropathogenic E coli (EPEC) NOT DETECTED NOT DETECTED Final   Enterotoxigenic E coli (ETEC) NOT DETECTED NOT DETECTED Final    Shiga like toxin producing E coli (STEC) NOT DETECTED NOT DETECTED Final   Shigella/Enteroinvasive E coli (EIEC) NOT DETECTED NOT DETECTED Final   Cryptosporidium NOT DETECTED NOT DETECTED Final   Cyclospora cayetanensis NOT DETECTED NOT DETECTED Final   Entamoeba histolytica NOT DETECTED NOT DETECTED Final   Giardia lamblia NOT DETECTED NOT DETECTED Final   Adenovirus F40/41 NOT DETECTED NOT DETECTED Final   Astrovirus NOT  DETECTED NOT DETECTED Final   Norovirus GI/GII NOT DETECTED NOT DETECTED Final   Rotavirus A NOT DETECTED NOT DETECTED Final   Sapovirus (I, II, IV, and V) NOT DETECTED NOT DETECTED Final    Comment: Performed at [facility], [address] [place], [place] [address]  C Difficile Quick Screen w PCR reflex     Status: None   Collection Time: 04/24/20  4:55 AM   Specimen: Stool  Result Value Ref Range Status   C Diff antigen NEGATIVE NEGATIVE Final   C Diff toxin NEGATIVE NEGATIVE Final   C Diff interpretation No C. difficile detected.  Final    Comment: Performed at [facility], [address], [place], [place] [address]         Radiology Studies: No results found.      Scheduled Meds: . sodium chloride   Intravenous Once  . Chlorhexidine Gluconate Cloth  6 each Topical Daily  . folic acid  1 mg Oral Daily  . gabapentin  300 mg Oral QHS  . metoCLOPramide (REGLAN) injection  10 mg Intravenous Once   Followed by  . [START ON 05/01/2020] metoCLOPramide (REGLAN) injection  10 mg Intravenous Once  . pantoprazole  40 mg Oral BID  . peg 3350 powder  1 kit Oral Once  . sodium chloride flush  10-40 mL Intracatheter Q12H   Continuous Infusions: . heparin 800 Units/hr (04/30/20 0857)     LOS: 6 days    Time spent: 36 minutes spent on chart review, discussion with nursing staff, consultants, updating family and interview/physical exam; more than 50% of that time was spent in counseling and/or coordination of care.    Alvira Philips [place],  DO [facility] Available via Epic secure chat 7am-7pm After these hours, please refer to coverage provider listed on [email or web address] 04/30/2020, 12:06 PM

## 2020-04-30 NOTE — Progress Notes (Signed)
[facility] for Heparin (warfarin on hold) Indication: Mechanical MVR  Allergies  Allergen Reactions  . Oxycodone Itching    Patient Measurements: 49.9 kg (110 lb)  Vital Signs: Temp: 98.6 F (37 C) (08/05 1628) Temp Source: Oral (08/05 1628) BP: 131/98 (08/05 1628) Pulse Rate: 100 (08/05 1628)  Labs: Recent Labs    04/28/20 0321 04/28/20 0321 04/29/20 0410 04/30/20 0429 04/30/20 1603  HGB 8.2*   < > 8.2* 8.3*  --   HCT 24.3*  --  24.6* 24.4*  --   PLT 145*  --  154 189  --   LABPROT 30.3*  --  27.6* 22.7*  --   INR 3.0*  --  2.7* 2.1*  --   HEPARINUNFRC  --   --   --   --  0.26*  CREATININE 0.91  --  0.94  --   --    < > = values in this interval not displayed.    Estimated Creatinine Clearance: 51.9 mL/min (by C-G formula based on SCr of 0.94 mg/dL).  Assessment: 59 yo female on warfarin PTA for hx of mechanical MVR, holding warfarin and starting heparin in case procedures are needed during hospital stay. INR goal PTA is 2.5-3.5 and INR is 2 so heparin was started. Patient's PTA warfarin dose is 7.5 mg MWF and 5 mg on all other days of the week. Pharmacy is consulted to dose heparin.  Patient has been on heparin drip on and off due to procedure being rescheduled twice. Pt was therapeutic on 800 units/hr. Per Dr. Eliseo Squires, start heparin drip when INR <2.5. Per gastroenterology proceed with procedure when INR is <1.8 and stop heparin drip 4-hrs prior to procedure.   INR today is 2.1 and subtherapeutic and continues to trend down slowly with warfarin held. Hgb 8.3. Plt 189. No reported bleeding.   Heparin level subtherapeutic at 0.26. Will increase drip rate.  Goal of Therapy:  INR goal 2.5 - 3.5  Heparin level 0.3-0.7 units/ml Monitor platelets by anticoagulation protocol: Yes   Plan:  Increase Heparin drip to 850 units/hr, no bolus Heparin level in 6 hours Stop heparin drip 4-hrs prior to EGD when scheduled Monitor PT/INR and  CBC and daily heparin level.  Bridge to warfarin post-procedure Monitor for signs and symptoms of bleeding  Alanda Slim, PharmD, [facility] Clinical Pharmacist Please see AMION for all Pharmacists' Contact Phone Numbers 04/30/2020, 5:07 PM

## 2020-05-01 ENCOUNTER — Encounter (HOSPITAL_COMMUNITY): Payer: Self-pay | Admitting: Gastroenterology

## 2020-05-01 ENCOUNTER — Other Ambulatory Visit: Payer: Self-pay | Admitting: Physician Assistant

## 2020-05-01 ENCOUNTER — Inpatient Hospital Stay (HOSPITAL_COMMUNITY): Payer: Self-pay | Admitting: Anesthesiology

## 2020-05-01 ENCOUNTER — Encounter (HOSPITAL_COMMUNITY)
Admission: EM | Disposition: A | Discharge status: Home Health | Payer: Self-pay | Source: Home / Self Care | Attending: Internal Medicine

## 2020-05-01 DIAGNOSIS — D509 Iron deficiency anemia, unspecified: Secondary | ICD-10-CM

## 2020-05-01 DIAGNOSIS — K2970 Gastritis, unspecified, without bleeding: Secondary | ICD-10-CM

## 2020-05-01 HISTORY — PX: ESOPHAGOGASTRODUODENOSCOPY (EGD) WITH PROPOFOL: SHX5813

## 2020-05-01 HISTORY — PX: BIOPSY: SHX5522

## 2020-05-01 HISTORY — PX: COLONOSCOPY WITH PROPOFOL: SHX5780

## 2020-05-01 LAB — PROTIME-INR
INR: 1.4 — ABNORMAL HIGH (ref 0.8–1.2)
Prothrombin Time: 16.5 seconds — ABNORMAL HIGH (ref 11.4–15.2)

## 2020-05-01 LAB — CBC
HCT: 30.3 % — ABNORMAL LOW (ref 36.0–46.0)
Hemoglobin: 10.1 g/dL — ABNORMAL LOW (ref 12.0–15.0)
MCH: 33.3 pg (ref 26.0–34.0)
MCHC: 33.3 g/dL (ref 30.0–36.0)
MCV: 100 fL (ref 80.0–100.0)
Platelets: 270 10*3/uL (ref 150–400)
RBC: 3.03 MIL/uL — ABNORMAL LOW (ref 3.87–5.11)
RDW: 27.9 % — ABNORMAL HIGH (ref 11.5–15.5)
WBC: 6.7 10*3/uL (ref 4.0–10.5)
nRBC: 0.3 % — ABNORMAL HIGH (ref 0.0–0.2)

## 2020-05-01 LAB — HEPARIN LEVEL (UNFRACTIONATED): Heparin Unfractionated: 0.61 IU/mL (ref 0.30–0.70)

## 2020-05-01 SURGERY — COLONOSCOPY WITH PROPOFOL
Anesthesia: Monitor Anesthesia Care

## 2020-05-01 MED ORDER — LIDOCAINE HCL (CARDIAC) PF 100 MG/5ML IV SOSY
PREFILLED_SYRINGE | INTRAVENOUS | Status: DC | PRN
  Administered 2020-05-01: 60 mg via INTRATRACHEAL

## 2020-05-01 MED ORDER — PROPOFOL 500 MG/50ML IV EMUL
INTRAVENOUS | Status: DC | PRN
  Administered 2020-05-01: 125 ug/kg/min via INTRAVENOUS

## 2020-05-01 MED ORDER — PROPOFOL 10 MG/ML IV BOLUS
INTRAVENOUS | Status: DC | PRN
  Administered 2020-05-01: 10 mg via INTRAVENOUS

## 2020-05-01 MED ORDER — PANTOPRAZOLE SODIUM 40 MG PO TBEC
40.0000 mg | DELAYED_RELEASE_TABLET | Freq: Every day | ORAL | Status: DC
  Administered 2020-05-02 – 2020-05-04 (×3): 40 mg via ORAL
  Filled 2020-05-01 (×3): qty 1

## 2020-05-01 MED ORDER — WARFARIN - PHARMACIST DOSING INPATIENT: Freq: Every day | Status: DC

## 2020-05-01 MED ORDER — HEPARIN (PORCINE) 25000 UT/250ML-% IV SOLN
850.0000 [IU]/h | INTRAVENOUS | Status: DC
  Administered 2020-05-01 – 2020-05-02 (×2): 850 [IU]/h via INTRAVENOUS
  Filled 2020-05-01 (×2): qty 250

## 2020-05-01 MED ORDER — WARFARIN SODIUM 10 MG PO TABS
10.0000 mg | ORAL_TABLET | Freq: Once | ORAL | Status: AC
  Administered 2020-05-01: 10 mg via ORAL
  Filled 2020-05-01: qty 1

## 2020-05-01 MED ORDER — LACTATED RINGERS IV SOLN: INTRAVENOUS | Status: DC | PRN

## 2020-05-01 SURGICAL SUPPLY — 25 items

## 2020-05-01 NOTE — Anesthesia Preprocedure Evaluation (Signed)
Anesthesia Evaluation  Patient identified by MRN, date of birth, ID band Patient awake    Reviewed: Allergy & Precautions, NPO status , Patient's Chart, lab work & pertinent test results  History of Anesthesia Complications Negative for: history of anesthetic complications  Airway Mallampati: II  TM Distance: >3 FB Neck ROM: Full    Dental  (+) Missing   Pulmonary asthma , former smoker,    Pulmonary exam normal        Cardiovascular hypertension, Normal cardiovascular exam+ Valvular Problems/Murmurs MR      Neuro/Psych Carotid stenosis TIAnegative psych ROS   GI/Hepatic Neg liver ROS, GERD  ,  Endo/Other  negative endocrine ROS  Renal/GU negative Renal ROS  negative genitourinary   Musculoskeletal negative musculoskeletal ROS (+)   Abdominal   Peds  Hematology  (+) anemia ,   Anesthesia Other Findings  rheumatic heart disease 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, on chronic coumadin, GERD, anemia  Reproductive/Obstetrics                            Anesthesia Physical Anesthesia Plan  ASA: III  Anesthesia Plan: MAC   Post-op Pain Management:    Induction: Intravenous  PONV Risk Score and Plan: 2 and Propofol infusion, TIVA and Treatment may vary due to age or medical condition  Airway Management Planned: Natural Airway, Nasal Cannula and Simple Face Mask  Additional Equipment: None  Intra-op Plan:   Post-operative Plan:   Informed Consent: I have reviewed the patients History and Physical, chart, labs and discussed the procedure including the risks, benefits and alternatives for the proposed anesthesia with the patient or authorized representative who has indicated his/her understanding and acceptance.       Plan Discussed with:   Anesthesia Plan Comments:        Anesthesia Quick Evaluation

## 2020-05-01 NOTE — Anesthesia Postprocedure Evaluation (Signed)
Anesthesia Post Note  Patient: Mary [name]  Procedure(s) Performed: COLONOSCOPY WITH PROPOFOL (N/A ) ESOPHAGOGASTRODUODENOSCOPY (EGD) WITH PROPOFOL (N/A ) BIOPSY     Patient location during evaluation: Endoscopy Anesthesia Type: MAC Level of consciousness: awake and alert Pain management: pain level controlled Vital Signs Assessment: post-procedure vital signs reviewed and stable Respiratory status: spontaneous breathing, nonlabored ventilation and respiratory function stable Cardiovascular status: blood pressure returned to baseline and stable Postop Assessment: no apparent nausea or vomiting Anesthetic complications: no   No complications documented.  Last Vitals:  Vitals:   05/01/20 1352 05/01/20 1402  BP: (!) 100/58 131/80  Pulse:  100  Resp: 18 (!) 22  Temp: 36.6 C   SpO2: 100% 96%    Last Pain:  Vitals:   05/01/20 1402  TempSrc:   PainSc: 0-No pain                 Lidia Collum

## 2020-05-01 NOTE — Progress Notes (Signed)
PROGRESS NOTE    Mary [name]  YQI:[id number] DOB: 1961/01/15 DOA: 04/23/2020 PCP: Cain Saupe, MD    Brief Narrative:  Mary [name] is an 59 y.o. female with a past medical history of mitral valve replacement on warfarin, history of macrocytic anemia, history of GERD who was in her usual state of health till about 4 to 5 days ago when she started developing nausea followed by onset of vomiting and diarrhea. She denies any blood in the emesis or in the stool. She has had profuse amount of watery stool. Denies any recent antibiotic use. Denies eating out anywhere recently. She does have some abdominal discomfort associated with the symptoms. Denies any fever or chills. Denies any dysuria. She does admit to acid reflux symptoms. She had similar presentation back in April when she had to be hospitalized for dehydration. She has never had upper endoscopy or colonoscopy. It appears that these procedures were planned in May however patient could not find transportation and could not afford the medications needed for prep.   Assessment & Plan:   Principal Problem:   Acute gastroenteritis Active Problems:   Warfarin anticoagulation   History of rheumatic heart disease   Hyponatremia   Dehydration   Macrocytic anemia   N&V (nausea and vomiting)   H/O mitral valve replacement   Diarrhea   Anemia likely secondary to acute gastritis Patient presenting following 4-5 days acute nausea/vomiting.  Was pending EGD/colonoscopy in May but could not afford bowel prep or find transportation.  Patient was transfused 2 units PRBC on 04/26/2020.  Underwent EGD and colonoscopy on 05/01/2020 by Dr. Christella Hartigan with findings of pyloric stenosis with biopsy taken and scattered areas of mild barotrauma otherwise no findings of significance within the colon. --Hgb 6.0>8.8>8.4>8.2>8.3>10.1, stable --Protonix 40 mg p.o. daily  Hyponatremia: resolved  Hypokalemia: Resolved Etiology likely hypovolemic in the  setting of GI loss with nausea/vomiting.  Repleted.  Sodium 139 with potassium 3.8 today. --Continue to monitor electrolytes closely  Macrocytic anemia B12 376, folic acid 3.7, iron 168, TIBC 193, ferritin 186. --s/p 2u PRBC on 04/26/2020 --Continue folic acid 1 mg p.o. daily  Acute renal failure: Resolved Creatinine 1.81 on admission, likely secondary to prerenal azotemia from dehydration versus ATN from acute blood loss anemia.  Patient receives PRBCs, IV fluids with improvement of creatinine to 0.94.  History of rheumatic heart disease status post mechanical mitral valve replacement --On Coumadin outpatient with a goal INR 2.5-3.5 --INR 2.7>2.1>1.4 today --Okay to restart Coumadin per GI today, pharmacy consulted for assistance.  Goal INR 2.5-3.5 for mechanical MVR; will need to continue heparin drip until therapeutic.   DVT prophylaxis: Heparin drip, restart Coumadin today Code Status: Full code Family Communication: No family present at bedside this morning  Disposition Plan:  Status is: Inpatient  Remains inpatient appropriate because:Ongoing diagnostic testing needed not appropriate for outpatient work up and IV treatments appropriate due to intensity of illness or inability to take PO   Dispo: The patient is from: Home              Anticipated d/c is to: Home              Anticipated d/c date is: 3 days              Patient currently is not medically stable to d/c.  Consultants:   [facility] - Dr. Christella Hartigan  Procedures:   EGD 8/6: Pyloric stenosis status post biopsy  Colonoscopy 8/6: Scattered areas of mild  barotrauma, otherwise no acute findings  Antimicrobials:   none   Subjective: Patient seen and examined bedside, resting comfortably.  No complaints this morning.  INR down to 1.4 today.  Underwent EGD/colonoscopy this afternoon with only findings of pyloric stenosis.  GI okay to resume anticoagulation with Coumadin.  Patient with no other complaints or concerns  at this time.  Denies headache, no visual changes, no chest pain, no dizziness, no palpitations, no shortness of breath, no abdominal pain.  No acute events overnight per nursing staff.  Objective: Vitals:   05/01/20 0604 05/01/20 1224 05/01/20 1352 05/01/20 1402  BP: 126/77 (!) 144/83 (!) 100/58 131/80  Pulse: 95   100  Resp: 18 10 18  (!) 22  Temp: (!) 97.5 F (36.4 C) 98.2 F (36.8 C) 97.8 F (36.6 C)   TempSrc: Oral Oral Axillary   SpO2: 100% 100% 100% 96%  Weight:      Height:        Intake/Output Summary (Last 24 hours) at 05/01/2020 1439 Last data filed at 05/01/2020 1400 Gross per 24 hour  Intake 1481.47 ml  Output 210 ml  Net 1271.47 ml   Filed Weights   04/26/20 2046  Weight: 49.8 kg    Examination:  General exam: Appears calm and comfortable  Respiratory system: Clear to auscultation. Respiratory effort normal. Cardiovascular system: S1 & S2 heard, RRR. No JVD, murmurs, rubs, gallops or clicks. No pedal edema. Gastrointestinal system: Abdomen is nondistended, soft and nontender. No organomegaly or masses felt. Normal bowel sounds heard. Central nervous system: Alert and oriented. No focal neurological deficits. Extremities: Symmetric 5 x 5 power. Skin: No rashes, lesions or ulcers Psychiatry: Judgement and insight appear normal. Mood & affect appropriate.     Data Reviewed: I have personally reviewed following labs and imaging studies  CBC: Recent Labs  Lab 04/27/20 0455 04/28/20 0321 04/29/20 0410 04/30/20 0429 05/01/20 0312  WBC 5.5 5.4 5.1 5.5 6.7  HGB 8.4* 8.2* 8.2* 8.3* 10.1*  HCT 24.3* 24.3* 24.6* 24.4* 30.3*  MCV 97.6 100.0 99.6 100.8* 100.0  PLT 152 145* 154 189 270   Basic Metabolic Panel: Recent Labs  Lab 04/25/20 0143 04/26/20 0453 04/27/20 0455 04/28/20 0321 04/29/20 0410  NA 135 136 136 135 139  K 3.3* 3.9 3.4* 3.8 3.8  CL 105 111 112* 110 113*  CO2 15* 17* 16* 17* 18*  GLUCOSE 85 72 66* 69* 75  BUN 7 4* <5* <5* 5*    CREATININE 1.52* 1.13* 0.93 0.91 0.94  CALCIUM 8.6* 8.1* 8.0* 7.7* 7.8*  MG 2.3  --   --   --   --    GFR: Estimated Creatinine Clearance: 51.9 mL/min (by C-G formula based on SCr of 0.94 mg/dL). Liver Function Tests: No results for input(s): AST, ALT, ALKPHOS, BILITOT, PROT, ALBUMIN in the last 168 hours. No results for input(s): LIPASE, AMYLASE in the last 168 hours. No results for input(s): AMMONIA in the last 168 hours. Coagulation Profile: Recent Labs  Lab 04/27/20 0455 04/28/20 0321 04/29/20 0410 04/30/20 0429 05/01/20 0312  INR 3.1* 3.0* 2.7* 2.1* 1.4*   Cardiac Enzymes: No results for input(s): CKTOTAL, CKMB, CKMBINDEX, TROPONINI in the last 168 hours. BNP (last 3 results) No results for input(s): PROBNP in the last 8760 hours. HbA1C: No results for input(s): HGBA1C in the last 72 hours. CBG: No results for input(s): GLUCAP in the last 168 hours. Lipid Profile: No results for input(s): CHOL, HDL, LDLCALC, TRIG, CHOLHDL, LDLDIRECT in the last  72 hours. Thyroid Function Tests: No results for input(s): TSH, T4TOTAL, FREET4, T3FREE, THYROIDAB in the last 72 hours. Anemia Panel: No results for input(s): VITAMINB12, FOLATE, FERRITIN, TIBC, IRON, RETICCTPCT in the last 72 hours. Sepsis Labs: No results for input(s): PROCALCITON, LATICACIDVEN in the last 168 hours.  Recent Results (from the past 240 hour(s))  SARS Coronavirus 2 by RT PCR (hospital order, performed in [facility] hospital lab) Nasopharyngeal Nasopharyngeal Swab     Status: None   Collection Time: 04/23/20  7:46 PM   Specimen: Nasopharyngeal Swab  Result Value Ref Range Status   SARS Coronavirus 2 NEGATIVE NEGATIVE Final    Comment: (NOTE) SARS-CoV-2 target nucleic acids are NOT DETECTED.  The SARS-CoV-2 RNA is generally detectable in upper and lower respiratory specimens during the acute phase of infection. The lowest concentration of SARS-CoV-2 viral copies this assay can detect is 250 copies / mL.  A negative result does not preclude SARS-CoV-2 infection and should not be used as the sole basis for treatment or other patient management decisions.  A negative result may occur with improper specimen collection / handling, submission of specimen other than nasopharyngeal swab, presence of viral mutation(s) within the areas targeted by this assay, and inadequate number of viral copies (<250 copies / mL). A negative result must be combined with clinical observations, patient history, and epidemiological information.  Fact Sheet for Patients:   BoilerBrush.com.cy  Fact Sheet for Healthcare Providers: https://pope.com/  This test is not yet approved or  cleared by the [place] FDA and has been authorized for detection and/or diagnosis of SARS-CoV-2 by FDA under an Emergency Use Authorization (EUA).  This EUA will remain in effect (meaning this test can be used) for the duration of the COVID-19 declaration under Section 564(b)(1) of the Act, 21 U.S.C. section 360bbb-3(b)(1), unless the authorization is terminated or revoked sooner.  Performed at [facility], [address], [place], [place] [address]   Gastrointestinal Panel by PCR , Stool     Status: None   Collection Time: 04/24/20  4:55 AM   Specimen: Stool  Result Value Ref Range Status   Campylobacter species NOT DETECTED NOT DETECTED Final   Plesimonas shigelloides NOT DETECTED NOT DETECTED Final   Salmonella species NOT DETECTED NOT DETECTED Final   Yersinia enterocolitica NOT DETECTED NOT DETECTED Final   Vibrio species NOT DETECTED NOT DETECTED Final   Vibrio cholerae NOT DETECTED NOT DETECTED Final   Enteroaggregative E coli (EAEC) NOT DETECTED NOT DETECTED Final   Enteropathogenic E coli (EPEC) NOT DETECTED NOT DETECTED Final   Enterotoxigenic E coli (ETEC) NOT DETECTED NOT DETECTED Final   Shiga like toxin producing E coli (STEC) NOT DETECTED NOT DETECTED  Final   Shigella/Enteroinvasive E coli (EIEC) NOT DETECTED NOT DETECTED Final   Cryptosporidium NOT DETECTED NOT DETECTED Final   Cyclospora cayetanensis NOT DETECTED NOT DETECTED Final   Entamoeba histolytica NOT DETECTED NOT DETECTED Final   Giardia lamblia NOT DETECTED NOT DETECTED Final   Adenovirus F40/41 NOT DETECTED NOT DETECTED Final   Astrovirus NOT DETECTED NOT DETECTED Final   Norovirus GI/GII NOT DETECTED NOT DETECTED Final   Rotavirus A NOT DETECTED NOT DETECTED Final   Sapovirus (I, II, IV, and V) NOT DETECTED NOT DETECTED Final    Comment: Performed at [facility], [address], [place], [place] [address]  C Difficile Quick Screen w PCR reflex     Status: None   Collection Time: 04/24/20  4:55 AM  Specimen: Stool  Result Value Ref Range Status   C Diff antigen NEGATIVE NEGATIVE Final   C Diff toxin NEGATIVE NEGATIVE Final   C Diff interpretation No C. difficile detected.  Final    Comment: Performed at [facility], [address], [place], [place] [address]         Radiology Studies: No results found.      Scheduled Meds: . sodium chloride   Intravenous Once  . Chlorhexidine Gluconate Cloth  6 each Topical Daily  . folic acid  1 mg Oral Daily  . gabapentin  300 mg Oral QHS  . [START ON 05/02/2020] pantoprazole  40 mg Oral Daily  . sodium chloride flush  10-40 mL Intracatheter Q12H   Continuous Infusions:    LOS: 7 days    Time spent: 36 minutes spent on chart review, discussion with nursing staff, consultants, updating family and interview/physical exam; more than 50% of that time was spent in counseling and/or coordination of care.    Alvira Philips [place], DO [facility] Available via Epic secure chat 7am-7pm After these hours, please refer to coverage provider listed on [email or web address] 05/01/2020, 2:39 PM

## 2020-05-01 NOTE — Progress Notes (Signed)
ANTICOAGULATION CONSULT NOTE - Follow Up Consult  Pharmacy Consult for heparin Indication: MVR   Labs: Recent Labs    04/29/20 0410 04/29/20 0410 04/30/20 0429 04/30/20 1603 05/01/20 0312  HGB 8.2*   < > 8.3*  --  10.1*  HCT 24.6*  --  24.4*  --  30.3*  PLT 154  --  189  --  270  LABPROT 27.6*  --  22.7*  --  16.5*  INR 2.7*  --  2.1*  --  1.4*  HEPARINUNFRC  --   --   --  0.26* 0.61  CREATININE 0.94  --   --   --   --    < > = values in this interval not displayed.    Assessment/Plan:  59yo female therapeutic on heparin after rate change. Will continue gtt at current rate and f/u after EGD.   Wynona Neat, PharmD, BCPS  05/01/2020,4:02 AM

## 2020-05-01 NOTE — Interval H&P Note (Signed)
History and Physical Interval Note:  05/01/2020 1:10 PM  Mary [name]  has presented today for surgery, with the diagnosis of anemia.  diarrhea   nausea   vomiting.  The various methods of treatment have been discussed with the patient and family. After consideration of risks, benefits and other options for treatment, the patient has consented to  Procedure(s): COLONOSCOPY WITH PROPOFOL (N/A) ESOPHAGOGASTRODUODENOSCOPY (EGD) WITH PROPOFOL (N/A) as a surgical intervention.  The patient's history has been reviewed, patient examined, no change in status, stable for surgery.  I have reviewed the patient's chart and labs.  Questions were answered to the patient's satisfaction.     Milus Banister

## 2020-05-01 NOTE — Op Note (Signed)
[facility] Patient Name: Mary [name] Procedure Date : 05/01/2020 MRN: [id number] Attending MD: Milus Banister , MD Date of Birth: October 01, 1960 CSN: [id number] Age: 59 Admit Type: Inpatient Procedure:                Colonoscopy Indications:              Anemia, macrocytic Providers:                Milus Banister, MD, Benetta Spar RN, RN, Gershon Crane CRNA, CRNA, Cherylynn Ridges, Technician Referring MD:              Medicines:                Monitored Anesthesia Care Complications:            No immediate complications. Estimated blood loss:                            None. Estimated Blood Loss:     Estimated blood loss: none. Procedure:                Pre-Anesthesia Assessment:                           - Prior to the procedure, a History and Physical                            was performed, and patient medications and                            allergies were reviewed. The patient's tolerance of                            previous anesthesia was also reviewed. The risks                            and benefits of the procedure and the sedation                            options and risks were discussed with the patient.                            All questions were answered, and informed consent                            was obtained. Prior Anticoagulants: The patient has                            taken Coumadin (warfarin), last dose was 5 days                            prior to procedure. ASA Grade Assessment: III - A  patient with severe systemic disease. After                            reviewing the risks and benefits, the patient was                            deemed in satisfactory condition to undergo the                            procedure.                           After obtaining informed consent, the colonoscope                            was passed under direct vision. Throughout the                             procedure, the patient's blood pressure, pulse, and                            oxygen saturations were monitored continuously. The                            CF-HQ190L ([id number]) Olympus colonoscope was                            introduced through the anus and advanced to the the                            cecum, identified by appendiceal orifice and                            ileocecal valve. The colonoscopy was performed                            without difficulty. The patient tolerated the                            procedure well. The quality of the bowel                            preparation was good. The ileocecal valve,                            appendiceal orifice, and rectum were photographed. Scope In: 1:22:37 PM Scope Out: 1:30:26 PM Scope Withdrawal Time: 0 hours 3 minutes 53 seconds  Total Procedure Duration: 0 hours 7 minutes 49 seconds  Findings:      Scattered areas of mild, acute 'barotrauma.'      The exam was otherwise without abnormality. Impression:               - Scattered areas of mild barotrauma (due to  insuflation of the colon).                           - No polyps or cancers. Recommendation:           - EGD now. Procedure Code(s):        --- Professional ---                           [phone], Colonoscopy, flexible; diagnostic, including                            collection of specimen(s) by brushing or washing,                            when performed (separate procedure) Diagnosis Code(s):        --- Professional ---                           D50.9, Iron deficiency anemia, unspecified CPT copyright 2019 American Medical Association. All rights reserved. The codes documented in this report are preliminary and upon coder review may  be revised to meet current compliance requirements. Milus Banister, MD 05/01/2020 1:33:57 PM This report has been signed electronically. Number of Addenda: 0

## 2020-05-01 NOTE — Progress Notes (Signed)
[facility] for Heparin and Warfarin Indication: Mechanical MVR  Allergies  Allergen Reactions  . Oxycodone Itching    Patient Measurements: 49.9 kg (110 lb)  Vital Signs: Temp: 97.8 F (36.6 C) (08/06 1352) Temp Source: Axillary (08/06 1352) BP: 131/80 (08/06 1402) Pulse Rate: 100 (08/06 1402)  Labs: Recent Labs    04/29/20 0410 04/29/20 0410 04/30/20 0429 04/30/20 1603 05/01/20 0312  HGB 8.2*   < > 8.3*  --  10.1*  HCT 24.6*  --  24.4*  --  30.3*  PLT 154  --  189  --  270  LABPROT 27.6*  --  22.7*  --  16.5*  INR 2.7*  --  2.1*  --  1.4*  HEPARINUNFRC  --   --   --  0.26* 0.61  CREATININE 0.94  --   --   --   --    < > = values in this interval not displayed.    Estimated Creatinine Clearance: 51.9 mL/min (by C-G formula based on SCr of 0.94 mg/dL).  Assessment: 59 yo female on warfarin PTA for hx of mechanical MVR, holding warfarin and starting heparin in case procedures are needed during hospital stay. INR goal PTA is 2.5-3.5 and INR is 2 so heparin was started. Patient's PTA warfarin dose is 7.5 mg MWF and 5 mg on all other days of the week. Pharmacy is consulted to dose heparin and restart warfarin.  Patient has been on heparin drip on and off due to procedure being rescheduled twice. Procedures finally performed and now OK per MD to restart warfarin with heparin bridge.   INR today is 1.4 and subtherapeutic. Hgb 10.1. Plt 270. No reported bleeding.   Resume heparin at previous rate, no bolus, and give warfarin 10 mg tonight.   Goal of Therapy:  INR goal 2.5 - 3.5  Heparin level 0.3-0.7 units/ml Monitor platelets by anticoagulation protocol: Yes   Plan:  Restart Heparin drip at 850 units/hr, no bolus Heparin level in 6 hours Warfarin 10 mg PO x 1 today Monitor PT/INR and CBC and daily heparin level.  Monitor for signs and symptoms of bleeding  Olinda Nola A. Levada Dy, PharmD, BCPS, FNKF Clinical Pharmacist [facility] Please utilize Amion for appropriate phone number to reach the unit pharmacist ([facility])

## 2020-05-01 NOTE — Op Note (Addendum)
[facility] Patient Name: Mary [name] Procedure Date : 05/01/2020 MRN: [id number] Attending MD: Milus Banister , MD Date of Birth: 1960/11/27 CSN: [id number] Age: 59 Admit Type: Inpatient Procedure:                Upper GI endoscopy Indications:              Macrocytic anemia, nausea, vomiting, abnormal GE                            junction on recent CT Providers:                Milus Banister, MD, Benetta Spar RN, RN, Lazaro Arms, Technician, Gershon Crane CRNA, CRNA Referring MD:              Medicines:                Monitored Anesthesia Care Complications:            No immediate complications. Estimated blood loss:                            None. Estimated Blood Loss:     Estimated blood loss: none. Procedure:                Pre-Anesthesia Assessment:                           - Prior to the procedure, a History and Physical                            was performed, and patient medications and                            allergies were reviewed. The patient's tolerance of                            previous anesthesia was also reviewed. The risks                            and benefits of the procedure and the sedation                            options and risks were discussed with the patient.                            All questions were answered, and informed consent                            was obtained. Prior Anticoagulants: The patient has                            taken Coumadin (warfarin), last dose was 5 days  prior to procedure. ASA Grade Assessment: III - A                            patient with severe systemic disease. After                            reviewing the risks and benefits, the patient was                            deemed in satisfactory condition to undergo the                            procedure.                           After obtaining informed consent, the endoscope was                             passed under direct vision. Throughout the                            procedure, the patient's blood pressure, pulse, and                            oxygen saturations were monitored continuously. The                            [id number]-[id number] ([id number]) Olympus gastroscope was                            introduced through the mouth, and advanced to the                            pylorus. The upper GI endoscopy was accomplished                            without difficulty. The patient tolerated the                            procedure well. Scope In: Scope Out: Findings:      Normal esophagus.      Small hiatal hernia.      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 The lumen through       the pylorus was 4-51mm for about 1cm long and would not allow for adult       gastroscope passage. I sampled the mucosa within the stenosis with       biospy forceps.      Moderate inflammation characterized by friability and granularity was       found in the gastric antrum. Biopsies were taken with a cold forceps for       histology.      The exam was otherwise without abnormality. Impression:               - Normal esophagus.                           -  Small hiatal hernia.                           - Pyloric stenosis with non neoplastic appearing                            mucosa. The lumen through the pylorus was 4-23mm for                            about 1cm long and would not allow for adult                            gastroscope passage. I sampled the mucosa within                            the stenosis with biospy forceps. Her nausea and                            vomiting has been going on for a very long time,                            years. She previously took NSAIDS (advil, motrin,                            BCs) on a daily basis and that certainly may have                            caused her pyloric stenosis.                           - Mild  non-specific gastritis, biopsied to check                            for H. pylori. Recommendation:           - She is OK from a GI standpoint to d/c home.                           - I will contact her with the biopsy results.                           - Will start full liquid diet now, can advance as                            tolerated.                           - Will decrease PPI to once daily, she should                            continue that indefinitely.                           - OK to resume blood thinners today. Procedure  Code(s):        --- Professional ---                           [phone], 57, Esophagogastroduodenoscopy, flexible,                            transoral; with biopsy, single or multiple Diagnosis Code(s):        --- Professional ---                           K29.70, Gastritis, unspecified, without bleeding                           D50.9, Iron deficiency anemia, unspecified CPT copyright 2019 American Medical Association. All rights reserved. The codes documented in this report are preliminary and upon coder review may  be revised to meet current compliance requirements. Milus Banister, MD 05/01/2020 1:54:01 PM This report has been signed electronically. Number of Addenda: 0

## 2020-05-01 NOTE — Transfer of Care (Signed)
Immediate Anesthesia Transfer of Care Note  Patient: Mary [name]  Procedure(s) Performed: COLONOSCOPY WITH PROPOFOL (N/A ) ESOPHAGOGASTRODUODENOSCOPY (EGD) WITH PROPOFOL (N/A ) BIOPSY  Patient Location: Endoscopy Unit  Anesthesia Type:MAC  Level of Consciousness: awake, oriented, drowsy and patient cooperative  Airway & Oxygen Therapy: Patient Spontanous Breathing and Patient connected to nasal cannula oxygen  Post-op Assessment: Report given to RN and Post -op Vital signs reviewed and stable  Post vital signs: Reviewed and stable  Last Vitals:  Vitals Value Taken Time  BP    Temp    Pulse    Resp    SpO2      Last Pain:  Vitals:   05/01/20 1224  TempSrc: Oral  PainSc: 0-No pain      Patients Stated Pain Goal: 0 (97/41/63 [id number])  Complications: No complications documented.

## 2020-05-02 LAB — HEPARIN LEVEL (UNFRACTIONATED): Heparin Unfractionated: 0.47 IU/mL (ref 0.30–0.70)

## 2020-05-02 LAB — PROTIME-INR
INR: 1.5 — ABNORMAL HIGH (ref 0.8–1.2)
Prothrombin Time: 17.4 seconds — ABNORMAL HIGH (ref 11.4–15.2)

## 2020-05-02 MED ORDER — WARFARIN SODIUM 10 MG PO TABS
10.0000 mg | ORAL_TABLET | Freq: Once | ORAL | Status: AC
  Administered 2020-05-02: 10 mg via ORAL
  Filled 2020-05-02: qty 1

## 2020-05-02 NOTE — Progress Notes (Signed)
[facility] for Heparin and Warfarin Indication: Mechanical MVR  Allergies  Allergen Reactions  . Oxycodone Itching    Patient Measurements: 49.9 kg (110 lb)  Vital Signs: Temp: 98.3 F (36.8 C) (08/06 2352) Temp Source: Oral (08/06 2352) BP: 120/78 (08/06 2352) Pulse Rate: 94 (08/06 2352)  Labs: Recent Labs    04/29/20 0410 04/29/20 0410 04/30/20 0429 04/30/20 1603 05/01/20 0312 05/02/20 0234  HGB 8.2*   < > 8.3*  --  10.1*  --   HCT 24.6*  --  24.4*  --  30.3*  --   PLT 154  --  189  --  270  --   LABPROT 27.6*   < > 22.7*  --  16.5* 17.4*  INR 2.7*   < > 2.1*  --  1.4* 1.5*  HEPARINUNFRC  --   --   --  0.26* 0.61 0.47  CREATININE 0.94  --   --   --   --   --    < > = values in this interval not displayed.    Estimated Creatinine Clearance: 51.9 mL/min (by C-G formula based on SCr of 0.94 mg/dL).  Assessment: 59 yo female on warfarin PTA for hx of mechanical MVR, holding warfarin and starting heparin in case procedures are needed during hospital stay. INR goal PTA is 2.5-3.5 and INR is 2 so heparin was started. Patient's PTA warfarin dose is 7.5 mg MWF and 5 mg on all other days of the week. Pharmacy is consulted to dose heparin and restart warfarin.  Patient has been on heparin drip on and off due to procedure being rescheduled twice. Procedures finally performed and now OK per MD to restart warfarin with heparin bridge.   Heparin level therapeutic (0.47) on gtt at 850 units/hr. No bleeding noted.  Goal of Therapy:  INR goal 2.5 - 3.5  Heparin level 0.3-0.7 units/ml Monitor platelets by anticoagulation protocol: Yes   Plan:  Continue heparin drip at 850 units/hr F/u daily heparin level  Sherlon Handing, PharmD, BCPS Please see amion for complete clinical pharmacist phone list 05/02/2020 3:03 AM

## 2020-05-02 NOTE — Progress Notes (Signed)
PROGRESS NOTE    Mary [name]  XBM:[id number] DOB: December 11, 1960 DOA: 04/23/2020 PCP: Cain Saupe, MD    Brief Narrative:  Mary [name] is an 59 y.o. female with a past medical history of mitral valve replacement on warfarin, history of macrocytic anemia, history of GERD who was in her usual state of health till about 4 to 5 days ago when she started developing nausea followed by onset of vomiting and diarrhea. She denies any blood in the emesis or in the stool. She has had profuse amount of watery stool. Denies any recent antibiotic use. Denies eating out anywhere recently. She does have some abdominal discomfort associated with the symptoms. Denies any fever or chills. Denies any dysuria. She does admit to acid reflux symptoms. She had similar presentation back in April when she had to be hospitalized for dehydration. She has never had upper endoscopy or colonoscopy. It appears that these procedures were planned in May however patient could not find transportation and could not afford the medications needed for prep.   Assessment & Plan:   Principal Problem:   Acute gastroenteritis Active Problems:   Warfarin anticoagulation   History of rheumatic heart disease   Hyponatremia   Dehydration   Macrocytic anemia   N&V (nausea and vomiting)   H/O mitral valve replacement   Diarrhea   Anemia likely secondary to acute gastritis Patient presenting following 4-5 days acute nausea/vomiting.  Was pending EGD/colonoscopy in May but could not afford bowel prep or find transportation.  Patient was transfused 2 units PRBC on 04/26/2020.  Underwent EGD and colonoscopy on 05/01/2020 by Dr. Christella Hartigan with findings of pyloric stenosis with biopsy taken and scattered areas of mild barotrauma otherwise no findings of significance within the colon. --Hgb 6.0>8.8>8.4>8.2>8.3>10.1, stable --Protonix 40 mg p.o. daily  Hyponatremia: resolved  Hypokalemia: Resolved Etiology likely hypovolemic in the  setting of GI loss with nausea/vomiting.  Repleted.  Sodium 139 with potassium 3.8 today. --Continue to monitor electrolytes closely  Macrocytic anemia B12 376, folic acid 3.7, iron 168, TIBC 193, ferritin 186. --s/p 2u PRBC on 04/26/2020 --Continue folic acid 1 mg p.o. daily  Acute renal failure: Resolved Creatinine 1.81 on admission, likely secondary to prerenal azotemia from dehydration versus ATN from acute blood loss anemia.  Patient receives PRBCs, IV fluids with improvement of creatinine to 0.94.  History of rheumatic heart disease status post mechanical mitral valve replacement --On Coumadin outpatient with a goal INR 2.5-3.5 --INR 2.7>2.1>1.4>1.5 today --Restarted coumadin 8/6, pharmacy consulted for assistance.  Goal INR 2.5-3.5 for mechanical MVR; will need to continue heparin drip until therapeutic.   DVT prophylaxis: Heparin drip, restart Coumadin today Code Status: Full code Family Communication: No family present at bedside this morning  Disposition Plan:  Status is: Inpatient  Remains inpatient appropriate because:Ongoing diagnostic testing needed not appropriate for outpatient work up and IV treatments appropriate due to intensity of illness or inability to take PO   Dispo: The patient is from: Home              Anticipated d/c is to: Home              Anticipated d/c date is: 3 days              Patient currently is not medically stable to d/c.  Consultants:   [facility] - Dr. Christella Hartigan  Procedures:   EGD 8/6: Pyloric stenosis status post biopsy  Colonoscopy 8/6: Scattered areas of mild barotrauma, otherwise no acute  findings  Antimicrobials:   none   Subjective: Patient seen and examined bedside, resting comfortably.  No complaints this morning.  Started Coumadin yesterday, INR up to 1.5.  Updated patient's mother via telephone.  Denies headache, no visual changes, no chest pain, no dizziness, no palpitations, no shortness of breath, no abdominal pain.   No acute events overnight per nursing staff.  Objective: Vitals:   05/01/20 2352 05/02/20 0500 05/02/20 0826 05/02/20 1248  BP: 120/78 114/79 125/83 (!) 139/91  Pulse: 94 93 (!) 106 98  Resp: 17 17  16   Temp: 98.3 F (36.8 C) 98 F (36.7 C)  98.1 F (36.7 C)  TempSrc: Oral Oral  Oral  SpO2: 99% 100% 100% 100%  Weight:      Height:        Intake/Output Summary (Last 24 hours) at 05/02/2020 1347 Last data filed at 05/02/2020 1248 Gross per 24 hour  Intake 906.33 ml  Output 450 ml  Net 456.33 ml   Filed Weights   04/26/20 2046  Weight: 49.8 kg    Examination:  General exam: Appears calm and comfortable  Respiratory system: Clear to auscultation. Respiratory effort normal. Cardiovascular system: S1 & S2 heard, RRR. No JVD, murmurs, rubs, gallops or clicks. No pedal edema. Gastrointestinal system: Abdomen is nondistended, soft and nontender. No organomegaly or masses felt. Normal bowel sounds heard. Central nervous system: Alert and oriented. No focal neurological deficits. Extremities: Symmetric 5 x 5 power. Skin: No rashes, lesions or ulcers Psychiatry: Judgement and insight appear normal. Mood & affect appropriate.     Data Reviewed: I have personally reviewed following labs and imaging studies  CBC: Recent Labs  Lab 04/27/20 0455 04/28/20 0321 04/29/20 0410 04/30/20 0429 05/01/20 0312  WBC 5.5 5.4 5.1 5.5 6.7  HGB 8.4* 8.2* 8.2* 8.3* 10.1*  HCT 24.3* 24.3* 24.6* 24.4* 30.3*  MCV 97.6 100.0 99.6 100.8* 100.0  PLT 152 145* 154 189 270   Basic Metabolic Panel: Recent Labs  Lab 04/26/20 0453 04/27/20 0455 04/28/20 0321 04/29/20 0410  NA 136 136 135 139  K 3.9 3.4* 3.8 3.8  CL 111 112* 110 113*  CO2 17* 16* 17* 18*  GLUCOSE 72 66* 69* 75  BUN 4* <5* <5* 5*  CREATININE 1.13* 0.93 0.91 0.94  CALCIUM 8.1* 8.0* 7.7* 7.8*   GFR: Estimated Creatinine Clearance: 51.9 mL/min (by C-G formula based on SCr of 0.94 mg/dL). Liver Function Tests: No results for  input(s): AST, ALT, ALKPHOS, BILITOT, PROT, ALBUMIN in the last 168 hours. No results for input(s): LIPASE, AMYLASE in the last 168 hours. No results for input(s): AMMONIA in the last 168 hours. Coagulation Profile: Recent Labs  Lab 04/28/20 0321 04/29/20 0410 04/30/20 0429 05/01/20 0312 05/02/20 0234  INR 3.0* 2.7* 2.1* 1.4* 1.5*   Cardiac Enzymes: No results for input(s): CKTOTAL, CKMB, CKMBINDEX, TROPONINI in the last 168 hours. BNP (last 3 results) No results for input(s): PROBNP in the last 8760 hours. HbA1C: No results for input(s): HGBA1C in the last 72 hours. CBG: No results for input(s): GLUCAP in the last 168 hours. Lipid Profile: No results for input(s): CHOL, HDL, LDLCALC, TRIG, CHOLHDL, LDLDIRECT in the last 72 hours. Thyroid Function Tests: No results for input(s): TSH, T4TOTAL, FREET4, T3FREE, THYROIDAB in the last 72 hours. Anemia Panel: No results for input(s): VITAMINB12, FOLATE, FERRITIN, TIBC, IRON, RETICCTPCT in the last 72 hours. Sepsis Labs: No results for input(s): PROCALCITON, LATICACIDVEN in the last 168 hours.  Recent Results (from the  past 240 hour(s))  SARS Coronavirus 2 by RT PCR (hospital order, performed in [facility] hospital lab) Nasopharyngeal Nasopharyngeal Swab     Status: None   Collection Time: 04/23/20  7:46 PM   Specimen: Nasopharyngeal Swab  Result Value Ref Range Status   SARS Coronavirus 2 NEGATIVE NEGATIVE Final    Comment: (NOTE) SARS-CoV-2 target nucleic acids are NOT DETECTED.  The SARS-CoV-2 RNA is generally detectable in upper and lower respiratory specimens during the acute phase of infection. The lowest concentration of SARS-CoV-2 viral copies this assay can detect is 250 copies / mL. A negative result does not preclude SARS-CoV-2 infection and should not be used as the sole basis for treatment or other patient management decisions.  A negative result may occur with improper specimen collection / handling, submission of  specimen other than nasopharyngeal swab, presence of viral mutation(s) within the areas targeted by this assay, and inadequate number of viral copies (<250 copies / mL). A negative result must be combined with clinical observations, patient history, and epidemiological information.  Fact Sheet for Patients:   BoilerBrush.com.cy  Fact Sheet for Healthcare Providers: https://pope.com/  This test is not yet approved or  cleared by the [place] FDA and has been authorized for detection and/or diagnosis of SARS-CoV-2 by FDA under an Emergency Use Authorization (EUA).  This EUA will remain in effect (meaning this test can be used) for the duration of the COVID-19 declaration under Section 564(b)(1) of the Act, 21 U.S.C. section 360bbb-3(b)(1), unless the authorization is terminated or revoked sooner.  Performed at [facility], [address], [place], [place] [address]   Gastrointestinal Panel by PCR , Stool     Status: None   Collection Time: 04/24/20  4:55 AM   Specimen: Stool  Result Value Ref Range Status   Campylobacter species NOT DETECTED NOT DETECTED Final   Plesimonas shigelloides NOT DETECTED NOT DETECTED Final   Salmonella species NOT DETECTED NOT DETECTED Final   Yersinia enterocolitica NOT DETECTED NOT DETECTED Final   Vibrio species NOT DETECTED NOT DETECTED Final   Vibrio cholerae NOT DETECTED NOT DETECTED Final   Enteroaggregative E coli (EAEC) NOT DETECTED NOT DETECTED Final   Enteropathogenic E coli (EPEC) NOT DETECTED NOT DETECTED Final   Enterotoxigenic E coli (ETEC) NOT DETECTED NOT DETECTED Final   Shiga like toxin producing E coli (STEC) NOT DETECTED NOT DETECTED Final   Shigella/Enteroinvasive E coli (EIEC) NOT DETECTED NOT DETECTED Final   Cryptosporidium NOT DETECTED NOT DETECTED Final   Cyclospora cayetanensis NOT DETECTED NOT DETECTED Final   Entamoeba histolytica NOT DETECTED NOT DETECTED Final     Giardia lamblia NOT DETECTED NOT DETECTED Final   Adenovirus F40/41 NOT DETECTED NOT DETECTED Final   Astrovirus NOT DETECTED NOT DETECTED Final   Norovirus GI/GII NOT DETECTED NOT DETECTED Final   Rotavirus A NOT DETECTED NOT DETECTED Final   Sapovirus (I, II, IV, and V) NOT DETECTED NOT DETECTED Final    Comment: Performed at [facility], [address] [place], [place] [address]  C Difficile Quick Screen w PCR reflex     Status: None   Collection Time: 04/24/20  4:55 AM   Specimen: Stool  Result Value Ref Range Status   C Diff antigen NEGATIVE NEGATIVE Final   C Diff toxin NEGATIVE NEGATIVE Final   C Diff interpretation No C. difficile detected.  Final    Comment: Performed at [facility], [address], [place], [place]  [address]         Radiology Studies: No results found.      Scheduled Meds: . sodium chloride   Intravenous Once  . Chlorhexidine Gluconate Cloth  6 each Topical Daily  . folic acid  1 mg Oral Daily  . gabapentin  300 mg Oral QHS  . pantoprazole  40 mg Oral Daily  . sodium chloride flush  10-40 mL Intracatheter Q12H  . warfarin  10 mg Oral ONCE-1600  . Warfarin - Pharmacist Dosing Inpatient   Does not apply q1600   Continuous Infusions: . heparin 850 Units/hr (05/01/20 1500)     LOS: 8 days    Time spent: 32 minutes spent on chart review, discussion with nursing staff, consultants, updating family and interview/physical exam; more than 50% of that time was spent in counseling and/or coordination of care.    Alvira Philips [place], DO [facility] Available via Epic secure chat 7am-7pm After these hours, please refer to coverage provider listed on [email or web address] 05/02/2020, 1:47 PM

## 2020-05-02 NOTE — Progress Notes (Addendum)
ANTICOAGULATION CONSULT NOTE - Follow Up Consult  Pharmacy Consult for warfarin  Indication: Mechanical MVR  Allergies  Allergen Reactions  . Oxycodone Itching    Patient Measurements: Height: 5\' 2"  (157.5 cm) Weight: 49.8 kg (109 lb 12.6 oz) IBW/kg (Calculated) : 50.1  Vital Signs: Temp: 98 F (36.7 C) (08/07 0500) Temp Source: Oral (08/07 0500) BP: 125/83 (08/07 0826) Pulse Rate: 106 (08/07 0826)  Labs: Recent Labs    04/30/20 0429 04/30/20 1603 05/01/20 0312 05/02/20 0234  HGB 8.3*  --  10.1*  --   HCT 24.4*  --  30.3*  --   PLT 189  --  270  --   LABPROT 22.7*  --  16.5* 17.4*  INR 2.1*  --  1.4* 1.5*  HEPARINUNFRC  --  0.26* 0.61 0.47    Estimated Creatinine Clearance: 51.9 mL/min (by C-G formula based on SCr of 0.94 mg/dL).  Assessment: 58 yo female on warfarin PTA for hx of mechanical MVR. INR goal PTA is 2.5-3.5 and INR was 2 on admission, so heparin gtt was started and warfarin was held for procedures from 7/29-8/6. Pt INR increased up to 3.1 on 8/2 without any warfarin intake due to NPO status and changing diagnostic study schedule. Warfarin was restarted on 8/6 post-procedures. H/h uptrending. No bleeding noted.  Patient's PTA warfarin dose is 7.5 mg MWF and 5 mg on all other days of the week.  10mg  warfarin 8/6>> INR 1.5  INR subtherapeutic today. Continuing heparin >> warfarin bridge.   Goal of Therapy:  INR 2.5-3.5 Monitor platelets by anticoagulation protocol: Yes   Plan:  Warfarin 10mg  x1 today  Continue to monitor diet/intake, daily INRs, and s/sx of bleeding  Carolin Guernsey  PGY1 Pharmacy Resident 05/02/2020,8:42 AM

## 2020-05-03 ENCOUNTER — Inpatient Hospital Stay (HOSPITAL_COMMUNITY): Payer: Self-pay

## 2020-05-03 LAB — CBC
HCT: 26.3 % — ABNORMAL LOW (ref 36.0–46.0)
Hemoglobin: 8.7 g/dL — ABNORMAL LOW (ref 12.0–15.0)
MCH: 33.9 pg (ref 26.0–34.0)
MCHC: 33.1 g/dL (ref 30.0–36.0)
MCV: 102.3 fL — ABNORMAL HIGH (ref 80.0–100.0)
Platelets: 255 10*3/uL (ref 150–400)
RBC: 2.57 MIL/uL — ABNORMAL LOW (ref 3.87–5.11)
RDW: 28.5 % — ABNORMAL HIGH (ref 11.5–15.5)
WBC: 5.4 10*3/uL (ref 4.0–10.5)
nRBC: 0 % (ref 0.0–0.2)

## 2020-05-03 LAB — PROTIME-INR
INR: 2.6 — ABNORMAL HIGH (ref 0.8–1.2)
Prothrombin Time: 26.9 seconds — ABNORMAL HIGH (ref 11.4–15.2)

## 2020-05-03 LAB — HEPARIN LEVEL (UNFRACTIONATED): Heparin Unfractionated: 0.32 IU/mL (ref 0.30–0.70)

## 2020-05-03 MED ORDER — WARFARIN SODIUM 2 MG PO TABS
2.0000 mg | ORAL_TABLET | Freq: Once | ORAL | Status: AC
  Administered 2020-05-03: 2 mg via ORAL
  Filled 2020-05-03: qty 1

## 2020-05-03 NOTE — Progress Notes (Signed)
ANTICOAGULATION CONSULT NOTE - Follow Up Consult   Pharmacy Consult for heparin and warfarin  Indication: mechanical MVR  Allergies  Allergen Reactions  . Oxycodone Itching    Patient Measurements: Height: 5\' 2"  (157.5 cm) Weight: 49.8 kg (109 lb 12.6 oz) IBW/kg (Calculated) : 50.1 Heparin Dosing Weight: 49.8kg  Vital Signs: Temp: 98.3 F (36.8 C) (08/08 0650) Temp Source: Oral (08/08 0650) BP: 134/81 (08/08 0650) Pulse Rate: 92 (08/08 0650)  Labs: Recent Labs    05/01/20 0312 05/02/20 0234 05/03/20 0907  HGB 10.1*  --  8.7*  HCT 30.3*  --  26.3*  PLT 270  --  255  LABPROT 16.5* 17.4* 26.9*  INR 1.4* 1.5* 2.6*  HEPARINUNFRC 0.61 0.47 0.32    Estimated Creatinine Clearance: 51.9 mL/min (by C-G formula based on SCr of 0.94 mg/dL).   Assessment: 59 yo female on warfarin PTA for hx of mechanical MVR. INR goal PTA is 2.5-3.5 and INR was 2 on admission, so heparin gtt was started and warfarin was held for procedures from 7/29-8/6. Pt INR increased up to 3.1 on 8/2 without any warfarin intake due to NPO status and changing diagnostic study schedule. Warfarin was restarted on 8/6 post-procedures. H/h downtrended today, but no bleeding noted per RN.  Patient's PTA warfarin dose is 7.5 mg MWF and 5 mg on all other days of the week.  Heparin running at 850u/hr. Heparin level also therapeutic 0.37 today.  10mg  warfarin 8/6>> INR 1.5  10mg  warfarin 8/7>>INR 2.6: INR within therapeutic goal today.    Goal of Therapy:  INR 2.5-3.5 Monitor platelets by anticoagulation protocol: Yes   Plan:  Discontinue heparin drip given therapeutic INR on warfairn Give warfarin 2mg  x1 tonight given rise in INR overnight Monitor daily INR, signs of bleeding, and PO intake.    Carolin Guernsey  PGY1 Pharmacy Resident 05/03/2020,11:25 AM

## 2020-05-03 NOTE — Progress Notes (Signed)
Pt has a Single lumen picc in the R upper arm;  While changing the drsg, the picc came out to the 5cm mark;  Heparin is infusing;  RN aware;  Suggest a CXR for placement.  Thank you.

## 2020-05-03 NOTE — Progress Notes (Signed)
PROGRESS NOTE    KANDICE EBERLY  WUJ:[id number] DOB: 1961-03-17 DOA: 04/23/2020 PCP: Cain Saupe, MD    Brief Narrative:  Mary [name] is an 59 y.o. female with a past medical history of mitral valve replacement on warfarin, history of macrocytic anemia, history of GERD who was in her usual state of health till about 4 to 5 days ago when she started developing nausea followed by onset of vomiting and diarrhea. She denies any blood in the emesis or in the stool. She has had profuse amount of watery stool. Denies any recent antibiotic use. Denies eating out anywhere recently. She does have some abdominal discomfort associated with the symptoms. Denies any fever or chills. Denies any dysuria. She does admit to acid reflux symptoms. She had similar presentation back in April when she had to be hospitalized for dehydration. She has never had upper endoscopy or colonoscopy. It appears that these procedures were planned in May however patient could not find transportation and could not afford the medications needed for prep.   Assessment & Plan:   Principal Problem:   Acute gastroenteritis Active Problems:   Warfarin anticoagulation   History of rheumatic heart disease   Hyponatremia   Dehydration   Macrocytic anemia   N&V (nausea and vomiting)   H/O mitral valve replacement   Diarrhea   Anemia likely secondary to acute gastritis Patient presenting following 4-5 days acute nausea/vomiting.  Was pending EGD/colonoscopy in May but could not afford bowel prep or find transportation.  Patient was transfused 2 units PRBC on 04/26/2020.  Underwent EGD and colonoscopy on 05/01/2020 by Dr. Christella Hartigan with findings of pyloric stenosis with biopsy taken and scattered areas of mild barotrauma otherwise no findings of significance within the colon. --Hgb 6.0>8.8>8.4>8.2>8.3>10.1>8.4, stable --Protonix 40 mg p.o. daily --Surgical pathology pending  Hyponatremia: resolved  Hypokalemia:  Resolved Etiology likely hypovolemic in the setting of GI loss with nausea/vomiting.  Repleted.    Macrocytic anemia B12 376, folic acid 3.7, iron 168, TIBC 193, ferritin 186. --s/p 2u PRBC on 04/26/2020 --Continue folic acid 1 mg p.o. daily  Acute renal failure: Resolved Creatinine 1.81 on admission, likely secondary to prerenal azotemia from dehydration versus ATN from acute blood loss anemia.  Patient receives PRBCs, IV fluids with improvement of creatinine to 0.94.  History of rheumatic heart disease status post mechanical mitral valve replacement --On Coumadin outpatient with a goal INR 2.5-3.5 --INR 2.7>2.1>1.4>1.5>2.6 today --Restarted coumadin 8/6, pharmacy consulted for assistance.  Goal INR 2.5-3.5 for mechanical MVR; will need to continue heparin drip until 2 consecutive therapeutic INRs.   DVT prophylaxis: Heparin drip, restarted Coumadin today Code Status: Full code Family Communication: No family present at bedside this morning, updated patient's mother via telephone yesterday  Disposition Plan:  Status is: Inpatient  Remains inpatient appropriate because:Ongoing diagnostic testing needed not appropriate for outpatient work up and IV treatments appropriate due to intensity of illness or inability to take PO   Dispo: The patient is from: Home              Anticipated d/c is to: Home              Anticipated d/c date is: 3 days              Patient currently is not medically stable to d/c.  Consultants:   [facility] - Dr. Christella Hartigan  Procedures:   EGD 8/6: Pyloric stenosis status post biopsy  Colonoscopy 8/6: Scattered areas of mild barotrauma, otherwise  no acute findings  Antimicrobials:   none   Subjective: Patient seen and examined bedside, resting comfortably.  No complaints this morning.  Started Coumadin yesterday, INR up to 1.5.  Updated patient's mother via telephone.  Denies headache, no visual changes, no chest pain, no dizziness, no palpitations, no  shortness of breath, no abdominal pain.  No acute events overnight per nursing staff.  Objective: Vitals:   05/02/20 1808 05/03/20 0105 05/03/20 0650 05/03/20 1100  BP: (!) 138/93 133/83 134/81   Pulse: (!) 104 91 92   Resp: 16 16 16    Temp: 98.6 F (37 C) 98.1 F (36.7 C) 98.3 F (36.8 C)   TempSrc: Oral Oral Oral   SpO2: 100% 100% 100%   Weight:    49.8 kg  Height:    5' 2.01" (1.575 m)    Intake/Output Summary (Last 24 hours) at 05/03/2020 1140 Last data filed at 05/03/2020 1000 Gross per 24 hour  Intake 885.05 ml  Output --  Net 885.05 ml   Filed Weights   04/26/20 2046 05/03/20 1100  Weight: 49.8 kg 49.8 kg    Examination:  General exam: Appears calm and comfortable  Respiratory system: Clear to auscultation. Respiratory effort normal. Cardiovascular system: S1 & S2 heard, RRR. No JVD, murmurs, rubs, gallops or clicks. No pedal edema. Gastrointestinal system: Abdomen is nondistended, soft and nontender. No organomegaly or masses felt. Normal bowel sounds heard. Central nervous system: Alert and oriented. No focal neurological deficits. Extremities: Symmetric 5 x 5 power. Skin: No rashes, lesions or ulcers Psychiatry: Judgement and insight appear normal. Mood & affect appropriate.     Data Reviewed: I have personally reviewed following labs and imaging studies  CBC: Recent Labs  Lab 04/28/20 0321 04/29/20 0410 04/30/20 0429 05/01/20 0312 05/03/20 0907  WBC 5.4 5.1 5.5 6.7 5.4  HGB 8.2* 8.2* 8.3* 10.1* 8.7*  HCT 24.3* 24.6* 24.4* 30.3* 26.3*  MCV 100.0 99.6 100.8* 100.0 102.3*  PLT 145* 154 189 270 255   Basic Metabolic Panel: Recent Labs  Lab 04/27/20 0455 04/28/20 0321 04/29/20 0410  NA 136 135 139  K 3.4* 3.8 3.8  CL 112* 110 113*  CO2 16* 17* 18*  GLUCOSE 66* 69* 75  BUN <5* <5* 5*  CREATININE 0.93 0.91 0.94  CALCIUM 8.0* 7.7* 7.8*   GFR: Estimated Creatinine Clearance: 51.9 mL/min (by C-G formula based on SCr of 0.94 mg/dL). Liver  Function Tests: No results for input(s): AST, ALT, ALKPHOS, BILITOT, PROT, ALBUMIN in the last 168 hours. No results for input(s): LIPASE, AMYLASE in the last 168 hours. No results for input(s): AMMONIA in the last 168 hours. Coagulation Profile: Recent Labs  Lab 04/29/20 0410 04/30/20 0429 05/01/20 0312 05/02/20 0234 05/03/20 0907  INR 2.7* 2.1* 1.4* 1.5* 2.6*   Cardiac Enzymes: No results for input(s): CKTOTAL, CKMB, CKMBINDEX, TROPONINI in the last 168 hours. BNP (last 3 results) No results for input(s): PROBNP in the last 8760 hours. HbA1C: No results for input(s): HGBA1C in the last 72 hours. CBG: No results for input(s): GLUCAP in the last 168 hours. Lipid Profile: No results for input(s): CHOL, HDL, LDLCALC, TRIG, CHOLHDL, LDLDIRECT in the last 72 hours. Thyroid Function Tests: No results for input(s): TSH, T4TOTAL, FREET4, T3FREE, THYROIDAB in the last 72 hours. Anemia Panel: No results for input(s): VITAMINB12, FOLATE, FERRITIN, TIBC, IRON, RETICCTPCT in the last 72 hours. Sepsis Labs: No results for input(s): PROCALCITON, LATICACIDVEN in the last 168 hours.  Recent Results (from the past  240 hour(s))  SARS Coronavirus 2 by RT PCR (hospital order, performed in [facility] hospital lab) Nasopharyngeal Nasopharyngeal Swab     Status: None   Collection Time: 04/23/20  7:46 PM   Specimen: Nasopharyngeal Swab  Result Value Ref Range Status   SARS Coronavirus 2 NEGATIVE NEGATIVE Final    Comment: (NOTE) SARS-CoV-2 target nucleic acids are NOT DETECTED.  The SARS-CoV-2 RNA is generally detectable in upper and lower respiratory specimens during the acute phase of infection. The lowest concentration of SARS-CoV-2 viral copies this assay can detect is 250 copies / mL. A negative result does not preclude SARS-CoV-2 infection and should not be used as the sole basis for treatment or other patient management decisions.  A negative result may occur with improper specimen  collection / handling, submission of specimen other than nasopharyngeal swab, presence of viral mutation(s) within the areas targeted by this assay, and inadequate number of viral copies (<250 copies / mL). A negative result must be combined with clinical observations, patient history, and epidemiological information.  Fact Sheet for Patients:   BoilerBrush.com.cy  Fact Sheet for Healthcare Providers: https://pope.com/  This test is not yet approved or  cleared by the [place] FDA and has been authorized for detection and/or diagnosis of SARS-CoV-2 by FDA under an Emergency Use Authorization (EUA).  This EUA will remain in effect (meaning this test can be used) for the duration of the COVID-19 declaration under Section 564(b)(1) of the Act, 21 U.S.C. section 360bbb-3(b)(1), unless the authorization is terminated or revoked sooner.  Performed at [facility], [address] [place], [place] [address]   Gastrointestinal Panel by PCR , Stool     Status: None   Collection Time: 04/24/20  4:55 AM   Specimen: Stool  Result Value Ref Range Status   Campylobacter species NOT DETECTED NOT DETECTED Final   Plesimonas shigelloides NOT DETECTED NOT DETECTED Final   Salmonella species NOT DETECTED NOT DETECTED Final   Yersinia enterocolitica NOT DETECTED NOT DETECTED Final   Vibrio species NOT DETECTED NOT DETECTED Final   Vibrio cholerae NOT DETECTED NOT DETECTED Final   Enteroaggregative E coli (EAEC) NOT DETECTED NOT DETECTED Final   Enteropathogenic E coli (EPEC) NOT DETECTED NOT DETECTED Final   Enterotoxigenic E coli (ETEC) NOT DETECTED NOT DETECTED Final   Shiga like toxin producing E coli (STEC) NOT DETECTED NOT DETECTED Final   Shigella/Enteroinvasive E coli (EIEC) NOT DETECTED NOT DETECTED Final   Cryptosporidium NOT DETECTED NOT DETECTED Final   Cyclospora cayetanensis NOT DETECTED NOT DETECTED Final   Entamoeba  histolytica NOT DETECTED NOT DETECTED Final   Giardia lamblia NOT DETECTED NOT DETECTED Final   Adenovirus F40/41 NOT DETECTED NOT DETECTED Final   Astrovirus NOT DETECTED NOT DETECTED Final   Norovirus GI/GII NOT DETECTED NOT DETECTED Final   Rotavirus A NOT DETECTED NOT DETECTED Final   Sapovirus (I, II, IV, and V) NOT DETECTED NOT DETECTED Final    Comment: Performed at [facility], [address] [place], [place] [address]  C Difficile Quick Screen w PCR reflex     Status: None   Collection Time: 04/24/20  4:55 AM   Specimen: Stool  Result Value Ref Range Status   C Diff antigen NEGATIVE NEGATIVE Final   C Diff toxin NEGATIVE NEGATIVE Final   C Diff interpretation No C. difficile detected.  Final    Comment: Performed at [facility], [address] [place], [place] [address]  Radiology Studies: No results found.      Scheduled Meds:  sodium chloride   Intravenous Once   Chlorhexidine Gluconate Cloth  6 each Topical Daily   folic acid  1 mg Oral Daily   gabapentin  300 mg Oral QHS   pantoprazole  40 mg Oral Daily   sodium chloride flush  10-40 mL Intracatheter Q12H   Warfarin - Pharmacist Dosing Inpatient   Does not apply q1600   Continuous Infusions:  heparin 850 Units/hr (05/02/20 1555)     LOS: 9 days    Time spent: 32 minutes spent on chart review, discussion with nursing staff, consultants, updating family and interview/physical exam; more than 50% of that time was spent in counseling and/or coordination of care.    Alvira Philips [place], DO [facility] Available via Epic secure chat 7am-7pm After these hours, please refer to coverage provider listed on [email or web address] 05/03/2020, 11:40 AM

## 2020-05-03 NOTE — Care Management (Signed)
Patient requested a covid vaccine while here in the hospital because she has no transportation.  Patient information sent to [facility] supervisor to determine if patient may be eligible to receive vaccine while inpatient.

## 2020-05-03 NOTE — Plan of Care (Signed)
  Problem: Education: Goal: Knowledge of General Education information will improve Description: Including pain rating scale, medication(s)/side effects and non-pharmacologic comfort measures 05/03/2020 1036 by Lurline Idol, RN Outcome: Progressing 05/03/2020 1036 by Lurline Idol, RN Outcome: Progressing   Problem: Health Behavior/Discharge Planning: Goal: Ability to manage health-related needs will improve 05/03/2020 1036 by Lurline Idol, RN Outcome: Progressing 05/03/2020 1036 by Lurline Idol, RN Outcome: Progressing   Problem: Clinical Measurements: Goal: Ability to maintain clinical measurements within normal limits will improve 05/03/2020 1036 by Lurline Idol, RN Outcome: Progressing 05/03/2020 1036 by Lurline Idol, RN Outcome: Progressing Goal: Will remain free from infection 05/03/2020 1036 by Lurline Idol, RN Outcome: Progressing 05/03/2020 1036 by Lurline Idol, RN Outcome: Progressing Goal: Diagnostic test results will improve 05/03/2020 1036 by Lurline Idol, RN Outcome: Progressing 05/03/2020 1036 by Lurline Idol, RN Outcome: Progressing Goal: Respiratory complications will improve 05/03/2020 1036 by Lurline Idol, RN Outcome: Progressing 05/03/2020 1036 by Lurline Idol, RN Outcome: Progressing Goal: Cardiovascular complication will be avoided 05/03/2020 1036 by Lurline Idol, RN Outcome: Progressing 05/03/2020 1036 by Lurline Idol, RN Outcome: Progressing   Problem: Activity: Goal: Risk for activity intolerance will decrease 05/03/2020 1036 by Lurline Idol, RN Outcome: Progressing 05/03/2020 1036 by Lurline Idol, RN Outcome: Progressing   Problem: Nutrition: Goal: Adequate nutrition will be maintained 05/03/2020 1036 by Lurline Idol, RN Outcome: Progressing 05/03/2020 1036 by Lurline Idol, RN Outcome: Progressing   Problem: Coping: Goal: Level of anxiety will decrease 05/03/2020 1036 by Lurline Idol, RN Outcome: Progressing 05/03/2020 1036 by Lurline Idol, RN Outcome: Progressing   Problem: Elimination: Goal: Will not experience complications related to bowel motility 05/03/2020 1036 by Lurline Idol, RN Outcome: Progressing 05/03/2020 1036 by Lurline Idol, RN Outcome: Progressing Goal: Will not experience complications related to urinary retention 05/03/2020 1036 by Lurline Idol, RN Outcome: Progressing 05/03/2020 1036 by Lurline Idol, RN Outcome: Progressing   Problem: Pain Managment: Goal: General experience of comfort will improve 05/03/2020 1036 by Lurline Idol, RN Outcome: Progressing 05/03/2020 1036 by Lurline Idol, RN Outcome: Progressing   Problem: Safety: Goal: Ability to remain free from injury will improve 05/03/2020 1036 by Lurline Idol, RN Outcome: Progressing 05/03/2020 1036 by Lurline Idol, RN Outcome: Progressing   Problem: Skin Integrity: Goal: Risk for impaired skin integrity will decrease 05/03/2020 1036 by Lurline Idol, RN Outcome: Progressing 05/03/2020 1036 by Lurline Idol, RN Outcome: Progressing

## 2020-05-04 ENCOUNTER — Ambulatory Visit: Payer: Self-pay

## 2020-05-04 DIAGNOSIS — E538 Deficiency of other specified B group vitamins: Secondary | ICD-10-CM | POA: Diagnosis present

## 2020-05-04 LAB — BASIC METABOLIC PANEL
Anion gap: 6 (ref 5–15)
BUN: <5 mg/dL — ABNORMAL LOW (ref 6–20)
CO2: 21 mmol/L — ABNORMAL LOW (ref 22–32)
Calcium: 7.9 mg/dL — ABNORMAL LOW (ref 8.9–10.3)
Chloride: 114 mmol/L — ABNORMAL HIGH (ref 98–111)
Creatinine, Ser: 0.79 mg/dL (ref 0.44–1.00)
GFR calc Af Amer: >60 mL/min (ref >60)
GFR calc non Af Amer: >60 mL/min (ref >60)
Glucose, Bld: 73 mg/dL (ref 70–99)
Potassium: 3.4 mmol/L — ABNORMAL LOW (ref 3.5–5.1)
Sodium: 141 mmol/L (ref 135–145)

## 2020-05-04 LAB — CBC
HCT: 22.1 % — ABNORMAL LOW (ref 36.0–46.0)
Hemoglobin: 7.3 g/dL — ABNORMAL LOW (ref 12.0–15.0)
MCH: 33.6 pg (ref 26.0–34.0)
MCHC: 33 g/dL (ref 30.0–36.0)
MCV: 101.8 fL — ABNORMAL HIGH (ref 80.0–100.0)
Platelets: 232 10*3/uL (ref 150–400)
RBC: 2.17 MIL/uL — ABNORMAL LOW (ref 3.87–5.11)
RDW: 28 % — ABNORMAL HIGH (ref 11.5–15.5)
WBC: 5.3 10*3/uL (ref 4.0–10.5)
nRBC: 0 % (ref 0.0–0.2)

## 2020-05-04 LAB — SURGICAL PATHOLOGY

## 2020-05-04 LAB — MAGNESIUM: Magnesium: 1 mg/dL — ABNORMAL LOW (ref 1.7–2.4)

## 2020-05-04 LAB — PROTIME-INR
INR: 3.3 — ABNORMAL HIGH (ref 0.8–1.2)
Prothrombin Time: 32.6 seconds — ABNORMAL HIGH (ref 11.4–15.2)

## 2020-05-04 LAB — PREPARE RBC (CROSSMATCH)

## 2020-05-04 MED ORDER — PANTOPRAZOLE SODIUM 40 MG PO TBEC: 40.0000 mg | DELAYED_RELEASE_TABLET | Freq: Every day | ORAL | 0 refills | Status: DC

## 2020-05-04 MED ORDER — SODIUM CHLORIDE 0.9% IV SOLUTION: Freq: Once | INTRAVENOUS | Status: DC

## 2020-05-04 MED ORDER — WARFARIN SODIUM 2 MG PO TABS
2.0000 mg | ORAL_TABLET | Freq: Once | ORAL | Status: AC
  Administered 2020-05-04: 2 mg via ORAL
  Filled 2020-05-04: qty 1

## 2020-05-04 MED ORDER — FOLIC ACID 1 MG PO TABS: 1.0000 mg | ORAL_TABLET | Freq: Every day | ORAL | 0 refills | Status: AC

## 2020-05-04 MED ORDER — POTASSIUM CHLORIDE CRYS ER 20 MEQ PO TBCR
40.0000 meq | EXTENDED_RELEASE_TABLET | Freq: Once | ORAL | Status: AC
  Administered 2020-05-04: 40 meq via ORAL
  Filled 2020-05-04: qty 2

## 2020-05-04 MED FILL — FOLIC ACID 1 MG TABS: 1 | 90 days supply | Qty: 90 | Fill #0

## 2020-05-04 MED FILL — PANTOPRAZOLE SOD DR 40 MG T: 40 | 90 days supply | Qty: 90 | Fill #0

## 2020-05-04 NOTE — Progress Notes (Signed)
Educated patient on procedure. HOB less 45*. Pt held breath upon line removal. Pressure held for 5+min and pressure drsg applied. Instructed pt to remain in bed for 30 min and keep drsg CDI for 24 hours. Instructed to monitor and report to nurse for any s/sx of bleeding. Pt VU. Notified nurse that line was pulled. Fran Lowes, RN VAST

## 2020-05-04 NOTE — Plan of Care (Signed)
Pt understanding of discharge indtructions

## 2020-05-04 NOTE — Progress Notes (Signed)
ANTICOAGULATION CONSULT NOTE - Follow Up Consult   Pharmacy Consult for heparin and warfarin  Indication: mechanical MVR  Allergies  Allergen Reactions  . Oxycodone Itching    Patient Measurements: Height: 5' 2.01" (157.5 cm) Weight: 49.8 kg (109 lb 12.6 oz) IBW/kg (Calculated) : 50.12 Heparin Dosing Weight: 49.8kg  Vital Signs: Temp: 98 F (36.7 C) (08/09 0542) Temp Source: Oral (08/09 0542) BP: 128/84 (08/09 0542) Pulse Rate: 94 (08/09 0542)  Labs: Recent Labs    05/02/20 0234 05/03/20 0907 05/04/20 0310  HGB  --  8.7* 7.3*  HCT  --  26.3* 22.1*  PLT  --  255 232  LABPROT 17.4* 26.9* 32.6*  INR 1.5* 2.6* 3.3*  HEPARINUNFRC 0.47 0.32  --   CREATININE  --   --  0.79    Estimated Creatinine Clearance: 61 mL/min (by C-G formula based on SCr of 0.79 mg/dL).   Assessment: 59 yo female on warfarin PTA for hx of mechanical MVR. INR goal PTA is 2.5-3.5 and INR was 2 on admission, so heparin gtt was started and warfarin was held for procedures from 7/29-8/6. Pt INR increased up to 3.1 on 8/2 without any warfarin intake due to NPO status and changing diagnostic study schedule. Warfarin was restarted on 8/6 post-procedures. H/h downtrended today, but no bleeding noted per RN.  Patient's PTA warfarin dose is 7.5 mg MWF and 5 mg on all other days of the week.  10 mg warfarin 8/6 >> INR 1.5  10 mg warfarin 8/7 >> INR 2.6: INR within therapeutic goal today.  2 mg warfarin 8/8 >> INR 3.3: INR remains in therapeutic goal   Goal of Therapy:  INR 2.5-3.5 Monitor platelets by anticoagulation protocol: Yes   Plan:  Repeat warfarin 2 mg x 1 today Monitor daily INR, signs of bleeding, and PO intake.   Jamie Hafford P. Legrand Como, PharmD, [place] Please utilize Amion for appropriate phone number to reach the unit pharmacist ([place]) 05/04/2020 8:40 AM

## 2020-05-04 NOTE — Social Work (Signed)
CSW ordered a cane to be delivered to room, will make referral to [facility] agency to assess eligibility. Appointment made for pt at [place], September 1st at 9:30am w/ Geryl Rankins. This has been added to AVS along with instructions.   Westley Hummer, MSW, [employer]

## 2020-05-04 NOTE — Progress Notes (Signed)
[facility] to be D/C'd Home per MD order.  Discussed with the patient and all questions fully answered.   VSS, Skin clean, dry and intact without evidence of skin break down, no evidence of skin tears noted. IV catheter discontinued intact. Site without signs and symptoms of complications. Dressing and pressure applied.   An After Visit Summary was printed and given to the patient.    D/C education completed with patient/family including follow up instructions, medication list, d/c activities limitations if indicated, with other d/c instructions as indicated by MD - patient able to verbalize understanding, all questions fully answered.    Patient instructed to return to ED, call 911, or call MD for any changes in condition.    Patient escorted via [place], and D/C home via Car.

## 2020-05-04 NOTE — Evaluation (Signed)
Physical Therapy Evaluation Patient Details Name: Mary [name] MRN: [id number] DOB: Jan 31, 1961 Today's Date: 05/04/2020   History of Present Illness  The pt is a 59 yo female presenting with nausea/vomiting, and found to have anemia secondary to acute gastritis. PMH includes: mitral valve replacement on warfarin, GERD, and macrocytic anemia.    Clinical Impression  Pt in bed upon arrival of PT, agreeable to evaluation at this time. Prior to admission the pt reports she was independent with mobility, but significantly limited by progressing fatigue and weakness. The pt now presents with limitations in functional mobility, endurance, strength, and stability due to above dx and chronically reduced mobility, and will continue to benefit from skilled PT to address these deficits. The pt was able to demo 2 bouts of hallway ambulation, but demos increased seeking of UE support with progressive fatigue. The pt demos improved stability with single UE support and will benefit from use of a cane to allow for improved stability that is mobile enough to allow for navigation of the 17 steps she has to enter her apt. The pt was also educated on walking HEP and general strengthening exercises for her LE to further improve functional strength and stability. The pt would benefit from continued skilled PT to further progress strength and endurance if possible with lack of insurance, but is safe to return home with intermittent assist from her son who lives locally.      Follow Up Recommendations Home health PT;Supervision for mobility/OOB (if possible without insurance)    Engineer, technical sales    Recommendations for Other Services       Precautions / Restrictions Precautions Precautions: Fall Restrictions Weight Bearing Restrictions: No      Mobility  Bed Mobility Overal bed mobility: Independent                Transfers Overall transfer level: Independent Equipment used: None              General transfer comment: pt ambulating in hall upon arrival of PT, able to complete bed mobility and transfers. Pt initially used single UE support to rise to standing from EOB, reports she is unable to attempt standing without BUE support later in session. 2 sts in 30 sec  Ambulation/Gait Ambulation/Gait assistance: Supervision Gait Distance (Feet): 200 Feet (x2) Assistive device: 1 person hand held assist Gait Pattern/deviations: Step-through pattern;Decreased stride length;Drifts right/left   Gait velocity interpretation: <1.31 ft/sec, indicative of household ambulator General Gait Details: Pt initially walking in hall upon arrival of PT without use of rail, but progressed to increased use of wall/rail or HHA of 1 with continued ambulation and fatigue. The pt does not demo any instances of knee buckling or toe drag  Stairs Stairs: Yes Stairs assistance: Min guard Stair Management: One rail Left;Two rails;Step to pattern Number of Stairs: 5 General stair comments: The pt completed 1 step x5 with heavy use of rail but no physical assist. The pt uses the rails to pull herself forward despite cues or changes in positioning and steps up by flexing her hips and locking her LLE into full knee ext prior to pulling herself into standing using the rail.      Balance Overall balance assessment: Needs assistance   Sitting balance-Leahy Scale: Good     Standing balance support: Single extremity supported;During functional activity Standing balance-Leahy Scale: Poor Standing balance comment: reliant on UE support  Pertinent Vitals/Pain Pain Assessment: Faces Faces Pain Scale: Hurts a little bit Pain Location: bilateral feet and calf Pain Descriptors / Indicators: Grimacing;Sore;Sharp Pain Intervention(s): Limited activity within patient's tolerance;Monitored during session;Repositioned    Home Living Family/patient expects to be  discharged to:: Private residence Living Arrangements: Alone Available Help at Discharge: Family;Available PRN/intermittently Type of Home: Apartment Home Access: Stairs to enter Entrance Stairs-Rails: Right;Left Entrance Stairs-Number of Steps: 3, then 6, then 8. bilateral rails for each Home Layout: One level Home Equipment: None      Prior Function Level of Independence: Independent         Comments: pt reports independence, slowed by fatigue and increasing pain. Reports mobility decreasing over last 6 mo with no falls, but multiple "almost" falls     Hand Dominance   Dominant Hand: Right    Extremity/Trunk Assessment   Upper Extremity Assessment Upper Extremity Assessment: Generalized weakness    Lower Extremity Assessment Lower Extremity Assessment: Generalized weakness (The pt was able to ambulate with support without either knee buckling, but was unable to complete knee ext against gravity when cued. The pt was also able to ambulate without toe drag bilat, but was unable to maintain DF against light touch during MMT)    Cervical / Trunk Assessment Cervical / Trunk Assessment: Normal  Communication   Communication: No difficulties  Cognition Arousal/Alertness: Awake/alert Behavior During Therapy: WFL for tasks assessed/performed Overall Cognitive Status: Impaired/Different from baseline Area of Impairment: Safety/judgement;Problem solving                         Safety/Judgement: Decreased awareness of safety   Problem Solving: Decreased initiation;Difficulty sequencing General Comments: Cues for safety due to slightly reduced insight. Pt with some self-limitng tendencies, claiming fatigue and inability to move when asked directly, but able to use functionally when distracted by conversation.      General Comments General comments (skin integrity, edema, etc.): Pt educated in walking HEP and exercises in case HHPT is not feasible. Pt verbalized  understanding. HR to 110 bpm with ambulation and stair navigation    Exercises     Assessment/Plan    PT Assessment Patient needs continued PT services  PT Problem List Decreased strength;Decreased safety awareness;Decreased coordination;Decreased activity tolerance;Decreased balance       PT Treatment Interventions DME instruction;Gait training;Therapeutic exercise;Stair training;Functional mobility training;Balance training;Therapeutic activities;Patient/family education    PT Goals (Current goals can be found in the Care Plan section)  Acute Rehab PT Goals Patient Stated Goal: be able to walk to mailbox PT Goal Formulation: With patient Time For Goal Achievement: 05/18/20 Potential to Achieve Goals: Fair    Frequency Min 3X/week    AM-PAC PT "6 Clicks" Mobility  Outcome Measure Help needed turning from your back to your side while in a flat bed without using bedrails?: None Help needed moving from lying on your back to sitting on the side of a flat bed without using bedrails?: None Help needed moving to and from a bed to a chair (including a wheelchair)?: None Help needed standing up from a chair using your arms (e.g., wheelchair or bedside chair)?: None Help needed to walk in hospital room?: A Little Help needed climbing 3-5 steps with a railing? : A Little 6 Click Score: 22    End of Session Equipment Utilized During Treatment: Gait belt Activity Tolerance: Patient limited by fatigue Patient left: in bed;with call bell/phone within reach Nurse Communication: Mobility status PT Visit Diagnosis: Unsteadiness  on feet (R26.81);Muscle weakness (generalized) (M62.81);Difficulty in walking, not elsewhere classified (R26.2)    Time: 1833-5825 PT Time Calculation (min) (ACUTE ONLY): 31 min   Charges:   PT Evaluation $PT Eval Low Complexity: 1 Low PT Treatments $Gait Training: 8-22 mins        Karma Ganja, PT, DPT   Acute Rehabilitation Department Pager #: [phone]   Otho Bellows 05/04/2020, 9:01 AM

## 2020-05-04 NOTE — Discharge Summary (Signed)
Physician Discharge Summary  SENTORIA ETRIS HYQ:[id number] DOB: [birth year] DOA: 04/23/2020  PCP: Cain Saupe, MD  Admit date: 04/23/2020 Discharge date: 05/04/2020  Admitted From: Home Disposition: Home  Recommendations for Outpatient Follow-up:  1. Follow up with PCP in 1-2 weeks 2. Follow-up with cardiology/Coumadin clinic 1 week 3. Please obtain BMP/CBC in one week 4. Follow-up biopsy from EGD that was pending at time of discharge  Home Health: Physical therapy Equipment/Devices: Cane  Discharge Condition: Stable CODE STATUS: Full code Diet recommendation: Heart healthy diet  History of present illness:  Mary [name] is an 59 y.o.femalewith a past medical history of mitral valve replacement on warfarin, history of macrocytic anemia, history of GERD who was in her usual state of health till about 4 to 5 days ago when she started developing nausea followed by onset of vomiting and diarrhea. She denies any blood in the emesis or in the stool. She has had profuse amount of watery stool. Denies any recent antibiotic use. Denies eating out anywhere recently. She does have some abdominal discomfort associated with the symptoms. Denies any fever or chills. Denies any dysuria. She does admit to acid reflux symptoms. She had similar presentation back in April when she had to be hospitalized for dehydration. She has never had upper endoscopy or colonoscopy. It appears that these procedures were planned in May however patient could not find transportation and could not afford the medications needed for prep.  Hospital course:  Anemia likely secondary to acute gastritis Patient presenting following 4-5 days acute nausea/vomiting.  Was pending EGD/colonoscopy in May but could not afford bowel prep or find transportation.  Patient was transfused 2 units PRBC on 04/26/2020 and 1 unit pRBC on 05/04/2020. Underwent EGD and colonoscopy on 05/01/2020 by Dr. Christella Hartigan with findings of pyloric  stenosis with biopsy taken and scattered areas of mild barotrauma otherwise no findings of significance within the colon.  Continue Protonix 40 mg p.o. daily.  Surgical pathology pending at time of discharge.  Recommend repeat CBC in 1 week.  Hyponatremia: resolved  Hypokalemia: Resolved Etiology likely hypovolemic in the setting of GI loss with nausea/vomiting.  Repleted.    Macrocytic anemia B12 376, folic acid 3.7, iron 168, TIBC 193, ferritin 186.  Transfused 3 units PRBCs during hospitalization.  Continue folic acid 1 mg p.o. daily.  Commend CBC in 1 week.  Acute renal failure: Resolved Creatinine 1.81 on admission, likely secondary to prerenal azotemia from dehydration versus ATN from acute blood loss anemia.  Patient receives PRBCs, IV fluids with improvement of creatinine to 0.94.  History of rheumatic heart disease status post mechanical mitral valve replacement On Coumadin outpatient with a goal INR 2.5-3.5.  Patient's Coumadin was held initially and started on a heparin drip once her INR was subtherapeutic.  Following endoscopic procedures, patient was restarted on Coumadin with heparin bridge and INR was in the therapeutic range of 2.6 and 3.3 the following day at time of discharge.  Recommend follow-up with Coumadin clinic 1 week.  Discharge Diagnoses:  Active Problems:   Warfarin anticoagulation   History of rheumatic heart disease   Macrocytic anemia   H/O mitral valve replacement   Folic acid deficiency    Discharge Instructions  Discharge Instructions    Call MD for:  difficulty breathing, headache or visual disturbances   Complete by: As directed    Call MD for:  extreme fatigue   Complete by: As directed    Call MD for:  persistant dizziness or light-headedness  Complete by: As directed    Call MD for:  persistant nausea and vomiting   Complete by: As directed    Call MD for:  severe uncontrolled pain   Complete by: As directed    Call MD for:   temperature >100.4   Complete by: As directed    Diet - low sodium heart healthy   Complete by: As directed    Increase activity slowly   Complete by: As directed      Allergies as of 05/04/2020      Reactions   Oxycodone Itching      Medication List    STOP taking these medications   enoxaparin 60 MG/0.6ML injection Commonly known as: LOVENOX   esomeprazole 40 MG capsule Commonly known as: NexIUM     TAKE these medications   acetaminophen 500 MG tablet Commonly known as: TYLENOL Take 1,000 mg by mouth every 6 (six) hours as needed for headache.   aspirin EC 81 MG tablet Take 1 tablet (81 mg total) by mouth daily.   CLEAR EYES COMPLETE OP Place 2 drops into both eyes daily as needed (red eyes).   folic acid 1 MG tablet Commonly known as: FOLVITE Take 1 tablet (1 mg total) by mouth daily. Start taking on: May 05, 2020   gabapentin 300 MG capsule Commonly known as: NEURONTIN Take 1 capsule (300 mg total) by mouth at bedtime.   pantoprazole 40 MG tablet Commonly known as: PROTONIX Take 1 tablet (40 mg total) by mouth daily. Start taking on: May 05, 2020   warfarin 5 MG tablet Commonly known as: COUMADIN Take as directed. If you are unsure how to take this medication, talk to your nurse or doctor. Original instructions: Take 1.5 tablets Mon, Wed, Fri and 1 tablet all other days of the week or as directed by Coumadin clinic. What changed:   how much to take  how to take this  when to take this  additional instructions            Durable Medical Equipment  (From admission, onward)         Start     Ordered   05/04/20 1130  For home use only DME Cane  Once        05/04/20 1130          Follow-up Information    [facility]. Go on 05/27/2020.   Why: Appointment made for 9:30am, they will call you to confirm 3 days before scheduled time. Call to cancel or with any questions. Wear a mask. Contact information: [address] [place] [address] [phone]       [facility] Follow up.   Specialty: Home Health Services Why: [phone] The home health agency will contact you for the first home visit.       [facility] Follow up.   Why: To assist with transportation Contact information: [phone]       Fulp, Cammie, MD. Schedule an appointment as soon as possible for a visit in 1 week(s).   Specialty: Family Medicine Contact information: [address] [place] [address] [phone]        Jake Bathe, MD .   Specialty: Cardiology Contact information: [phone] [address] [place] [address] [phone]        Marinus Maw, MD .   Specialty: Cardiology Contact information: [phone] [address] [place] [address]  [phone]              Allergies  Allergen Reactions  . Oxycodone Itching    Consultations:  [facility] - Dr. Christella Hartigan  Cardiology   Procedures/Studies: DG Chest 1 View  Result Date: 05/03/2020 CLINICAL DATA:  Line placement EXAM: CHEST  1 VIEW COMPARISON:  12/29/2019 chest radiograph. FINDINGS: Right PICC terminates in the upper third of the SVC. Cardiac valvular prostheses in place. Stable cardiomediastinal silhouette with mild cardiomegaly. No pneumothorax. No pleural effusion. Hyperinflated lungs. No pulmonary edema. IMPRESSION: 1. Right PICC terminates in the upper third of the SVC. 2. Stable mild cardiomegaly without pulmonary edema. 3. Hyperinflated lungs, suggesting COPD. Electronically Signed   By: Delbert Phenix M.D.   On: 05/03/2020 11:51   CT ABDOMEN PELVIS W CONTRAST  Result Date: 04/23/2020 CLINICAL DATA:  Abdominal pain, gastroenteritis EXAM: CT ABDOMEN AND PELVIS WITH CONTRAST TECHNIQUE: Multidetector CT imaging of the abdomen and pelvis was performed using the standard protocol following bolus administration of intravenous contrast.  CONTRAST:  80mL OMNIPAQUE IOHEXOL 300 MG/ML  SOLN COMPARISON:  December 05, 2018 FINDINGS: Lower chest: The visualized heart size within normal limits. No pericardial fluid/thickening. There appears to be mild wall thickening seen at the distal GE junction. The visualized portions of the lungs are clear. Hepatobiliary: The liver is normal in density without focal abnormality.The main portal vein is patent. The patient is status post cholecystectomy. No biliary ductal dilation. Pancreas: Unremarkable. No pancreatic ductal dilatation or surrounding inflammatory changes. Spleen: Normal in size without focal abnormality. Adrenals/Urinary Tract: Both adrenal glands appear normal. The kidneys and collecting system appear normal without evidence of urinary tract calculus or hydronephrosis. Bladder is unremarkable. Stomach/Bowel: The stomach, small bowel, and colon are normal in appearance. No inflammatory changes, wall thickening, or obstructive findings.The appendix is normal. Vascular/Lymphatic: There are no enlarged mesenteric, retroperitoneal, or pelvic lymph nodes. No significant vascular findings are present. Reproductive: The uterus and adnexa are unremarkable. Other: No evidence of abdominal wall mass or hernia. Musculoskeletal: No acute or significant osseous findings. IMPRESSION: Findings which could be suggestive of mild esophagitis. No other acute intra-abdominal or pelvic pathology to explain the patient's symptoms. Hepatic steatosis Electronically Signed   By: Jonna Clark M.D.   On: 04/23/2020 22:44   [place] EKG SITE RITE  Result Date: 04/25/2020 If Site Rite image not attached, placement could not be confirmed due to current cardiac rhythm.     Subjective: Patient seen and examined bedside, resting comfortably.  INR now therapeutic for 2 consecutive days.  No specific implants this morning, ready for discharge home.  Denies headache, no fever/chills/night sweats, no nausea/vomiting/diarrhea, no chest  pain, palpitations, no shortness of breath, no abdominal pain, no weakness, no fatigue, no paresthesias.  No acute events overnight per nursing staff.  Discharge Exam: Vitals:   05/04/20 1523 05/04/20 1542  BP: 137/86 (!) 135/94  Pulse: (!) 58 75  Resp: 16 16  Temp: 98.3 F (36.8 C) 98.1 F (36.7 C)  SpO2: 100%    Vitals:   05/04/20 0542 05/04/20 1223 05/04/20 1523 05/04/20 1542  BP: 128/84 136/88 137/86 (!) 135/94  Pulse: 94 98 (!) 58 75  Resp: 16 16 16 16   Temp: 98 F (36.7 C) 98 F (36.7 C) 98.3 F (36.8 C) 98.1 F (36.7 C)  TempSrc: Oral Oral Oral   SpO2: 99% 100% 100%   Weight:      Height:        General: Pt is alert,  awake, not in acute distress Cardiovascular: RRR, S1/S2 +, no rubs, no gallops Respiratory: CTA bilaterally, no wheezing, no rhonchi Abdominal: Soft, NT, ND, bowel sounds + Extremities: no edema, no cyanosis    The results of significant diagnostics from this hospitalization (including imaging, microbiology, ancillary and laboratory) are listed below for reference.     Microbiology: No results found for this or any previous visit (from the past 240 hour(s)).   Labs: BNP (last 3 results) Recent Labs    12/29/19 1136  BNP 44.5   Basic Metabolic Panel: Recent Labs  Lab 04/28/20 0321 04/29/20 0410 05/04/20 0310  NA 135 139 141  K 3.8 3.8 3.4*  CL 110 113* 114*  CO2 17* 18* 21*  GLUCOSE 69* 75 73  BUN <5* 5* <5*  CREATININE 0.91 0.94 0.79  CALCIUM 7.7* 7.8* 7.9*  MG  --   --  1.0*   Liver Function Tests: No results for input(s): AST, ALT, ALKPHOS, BILITOT, PROT, ALBUMIN in the last 168 hours. No results for input(s): LIPASE, AMYLASE in the last 168 hours. No results for input(s): AMMONIA in the last 168 hours. CBC: Recent Labs  Lab 04/29/20 0410 04/30/20 0429 05/01/20 0312 05/03/20 0907 05/04/20 0310  WBC 5.1 5.5 6.7 5.4 5.3  HGB 8.2* 8.3* 10.1* 8.7* 7.3*  HCT 24.6* 24.4* 30.3* 26.3* 22.1*  MCV 99.6 100.8* 100.0 102.3*  101.8*  PLT 154 189 270 255 232   Cardiac Enzymes: No results for input(s): CKTOTAL, CKMB, CKMBINDEX, TROPONINI in the last 168 hours. BNP: Invalid input(s): POCBNP CBG: No results for input(s): GLUCAP in the last 168 hours. D-Dimer No results for input(s): DDIMER in the last 72 hours. Hgb A1c No results for input(s): HGBA1C in the last 72 hours. Lipid Profile No results for input(s): CHOL, HDL, LDLCALC, TRIG, CHOLHDL, LDLDIRECT in the last 72 hours. Thyroid function studies No results for input(s): TSH, T4TOTAL, T3FREE, THYROIDAB in the last 72 hours.  Invalid input(s): FREET3 Anemia work up No results for input(s): VITAMINB12, FOLATE, FERRITIN, TIBC, IRON, RETICCTPCT in the last 72 hours. Urinalysis    Component Value Date/Time   COLORURINE YELLOW 04/24/2020 1307   APPEARANCEUR CLEAR 04/24/2020 1307   LABSPEC 1.034 (H) 04/24/2020 1307   PHURINE 7.0 04/24/2020 1307   GLUCOSEU NEGATIVE 04/24/2020 1307   HGBUR MODERATE (A) 04/24/2020 1307   BILIRUBINUR NEGATIVE 04/24/2020 1307   KETONESUR NEGATIVE 04/24/2020 1307   PROTEINUR NEGATIVE 04/24/2020 1307   UROBILINOGEN 0.2 06/04/2015 1533   NITRITE NEGATIVE 04/24/2020 1307   LEUKOCYTESUR TRACE (A) 04/24/2020 1307   Sepsis Labs Invalid input(s): PROCALCITONIN,  WBC,  LACTICIDVEN Microbiology No results found for this or any previous visit (from the past 240 hour(s)).   Time coordinating discharge: Over 30 minutes  SIGNED:   Alvira Philips [place], DO  [facility] 05/04/2020, 4:54 PM

## 2020-05-04 NOTE — TOC Transition Note (Signed)
Transition of Care [facility]) - CM/SW Discharge Note   Patient Details  Name: ASEES MANFREDI MRN: [id number] Date of Birth: [birth year]  [facility] CM/SW Contact:  Pollie Friar, RN Phone Number: 05/04/2020, 1:22 PM   Clinical Narrative:     Pt with orders for [facility] services but has no insurnace. [facility] is charity this week and have accepted the referral.  Pt with orders for cane. [employer] aware and will deliver it to the room. Mediations for d/c sent to [facility] and delivered to the room. Pt states her son will provide transportation home.  CM has submitted a [facility] referral to assist with her transportation outside of the hospital.     Final next level of care: [place] Barriers to Discharge: Inadequate or no insurance, Barriers Unresolved (comment)   Patient Goals and CMS Choice     Choice offered to / list presented to : Patient  Discharge Placement                       Discharge Plan and Services                DME Arranged: Kasandra Knudsen DME Agency: [facility] (charity [facility]) Date DME Agency Contacted: 05/04/20   Representative spoke with at DME Agency: [place]: PT [place]: [place] [facility] (charity services) Date HH Agency Contacted: 05/04/20   Representative spoke with at [place]: Butch Penny  Social Determinants of Health ([place]) Interventions     Readmission Risk Interventions No flowsheet data found.

## 2020-05-05 ENCOUNTER — Telehealth: Payer: Self-pay

## 2020-05-05 LAB — TYPE AND SCREEN
ABO/RH(D): O POS
Antibody Screen: NEGATIVE
Donor AG Type: NEGATIVE
Unit division: 0
Unit division: 0

## 2020-05-05 LAB — BPAM RBC
Blood Product Expiration Date: 202108142359
Blood Product Expiration Date: 202109072359
ISSUE DATE / TIME: 202108091517
Unit Type and Rh: 5100
Unit Type and Rh: 9500

## 2020-05-05 NOTE — Telephone Encounter (Signed)
Transition Care Management Follow-up Telephone Call Date of discharge and from where: 05/04/2020, [facility]  Call placed to patient # [phone], message left with call back requested to this CM # [phone]  She has an appointment at [facility] 05/12/2020

## 2020-05-06 ENCOUNTER — Telehealth: Payer: Self-pay

## 2020-05-06 ENCOUNTER — Ambulatory Visit: Payer: Self-pay | Admitting: *Deleted

## 2020-05-06 ENCOUNTER — Telehealth: Payer: Self-pay | Admitting: Cardiology

## 2020-05-06 ENCOUNTER — Telehealth (HOSPITAL_COMMUNITY): Payer: Self-pay | Admitting: Licensed Clinical Social Worker

## 2020-05-06 ENCOUNTER — Other Ambulatory Visit: Payer: Self-pay | Admitting: Cardiology

## 2020-05-06 MED ORDER — POTASSIUM CHLORIDE CRYS ER 20 MEQ PO TBCR
20.0000 meq | EXTENDED_RELEASE_TABLET | Freq: Every day | ORAL | 3 refills | Status: DC
Start: 2020-05-06 — End: 2020-08-24

## 2020-05-06 MED ORDER — FUROSEMIDE 40 MG PO TABS
40.0000 mg | ORAL_TABLET | Freq: Every day | ORAL | 3 refills | Status: DC
Start: 2020-05-06 — End: 2020-08-24

## 2020-05-06 MED FILL — POTASSIUM CL ER 20 MEQ TABL: 20 | 30 days supply | Qty: 30 | Fill #0

## 2020-05-06 MED FILL — FUROSEMIDE 40 MG TAB: 40 | 30 days supply | Qty: 30 | Fill #0

## 2020-05-06 NOTE — Telephone Encounter (Signed)
Caller c/o swelling "all over". Discharged from hospital for dehydration Monday. Tuesday received 1st covid vaccine.  C/o swelling in bilateral feet , legs, hands, arms and abdomen. Reports 15lb weight gain since Monday. Shortness of breath reported and is chronic per patient. Difficulty walking. Instructed patient to go to ED and patient refused for now. Reports she just came from there and does not want to go back. Encouraged urgent care and patient does not want to go there. Last PCP Dr. Chapman Fitch. Instructed patient to call cardiology as well. Care advise given. Patient verbalized understanding but requesting not to go to ED at this time. Please advise.   Reason for Disposition . [1] Can't walk or can barely walk AND [2] new-onset  Answer Assessment - Initial Assessment Questions 1. ONSET: "When did the swelling start?" (e.g., minutes, hours, days)     Today  2. LOCATION: "What part of the leg is swollen?"  "Are both legs swollen or just one leg?"     Bilateral legs and arms and hands, abdomen.  3. SEVERITY: "How bad is the swelling?" (e.g., localized; mild, moderate, severe)  - Localized - small area of swelling localized to one leg  - MILD pedal edema - swelling limited to foot and ankle, pitting edema < 1/4 inch (6 mm) deep, rest and elevation eliminate most or all swelling  - MODERATE edema - swelling of lower leg to knee, pitting edema > 1/4 inch (6 mm) deep, rest and elevation only partially reduce swelling  - SEVERE edema - swelling extends above knee, facial or hand swelling present      severe 4. REDNESS: "Does the swelling look red or infected?"     na 5. PAIN: "Is the swelling painful to touch?" If Yes, ask: "How painful is it?"   (Scale 1-10; mild, moderate or severe)     Yes. 6. FEVER: "Do you have a fever?" If Yes, ask: "What is it, how was it measured, and when did it start?"      no 7. CAUSE: "What do you think is causing the leg swelling?"      Released from hospital Monday and  received 1st covid shot Tuesday. 8. MEDICAL HISTORY: "Do you have a history of heart failure, kidney disease, liver failure, or cancer?"      Cardiac hx.  9. RECURRENT SYMPTOM: "Have you had leg swelling before?" If Yes, ask: "When was the last time?" "What happened that time?"     Yes  10. OTHER SYMPTOMS: "Do you have any other symptoms?" (e.g., chest pain, difficulty breathing)       Difficulty breathing is chronic per patient, weight gain from 120lbs- 135lbs since yesterday. 11. PREGNANCY: "Is there any chance you are pregnant?" "When was your last menstrual period?"       na  Protocols used: LEG SWELLING AND EDEMA-A-AH

## 2020-05-06 NOTE — Telephone Encounter (Signed)
Spoke with pt who is reporting bilateral edema up to thighs - (rubbing together which has never happened). Was recently in the hospital and received a blood transfusion for anemia.  8/9 BUN less than 5 Crea 0.79 K+ 3.4.  Pt's wt on 7/29 here in the office was 110 lbs - today on her home scales wt is 132 lbs.  Pt denies any increase in SOB.  Is avoiding all NA+.  Does not want to go back to the hospital.  She is not ordered any type of diuretics.  Advised I will have Dr Marlou Porch to review and c/b with any new orders/recommendations.

## 2020-05-06 NOTE — Telephone Encounter (Signed)
° ° °  Pt was discharge 2 days ago and would like to see Dr. Marlou Porch. She said wellness center wants her to go back to the hospital but she doesn't want to, then she was told she needs to see Dr. Marlou Porch and get coumadin check. Coumadin is scheduled but would like to see Dr. Marlou Porch or APP this week or next week.

## 2020-05-06 NOTE — Telephone Encounter (Signed)
Transition Care Management Follow-up Telephone Call Attempt # 2 Date of discharge and from where: 05/04/2020, [facility]  Call placed to patient # [phone], message left with call back requested to this CM # [phone]  She has an appointment at [facility] 05/12/2020

## 2020-05-06 NOTE — Telephone Encounter (Signed)
Pt called to request transport to coumadin appt tomorrow- transport request sent in to [facility] Transportation services.  CSW will continue to follow and assist as needed  Jorge Ny, [facility] Desk#: [phone] Cell#: [phone]

## 2020-05-06 NOTE — Telephone Encounter (Signed)
Start lasix 40mg  PO QD with KCL 20 meq Have her come in to office on Friday if possible for BMET and APP visit.   Candee Furbish, MD

## 2020-05-06 NOTE — Telephone Encounter (Signed)
Pt aware of new orders to start Furosemide 40 QAM and KCL 20 meq daily, f/u Friday in office and BMP.  rx sent into pharmacy of choice.  appt scheduled with Dr Irish Lack at 1:40 pm with coumadin clinic appt to follow after.  Advised pt to continue to avoid all NA+ and to elevate feet/legs above the level of her heart as much as possible.  Pt states understanding and thanked me for the c/b, information and help.  Appreciative of the effort to try to keep her from having to go to the ED for treatment.

## 2020-05-07 ENCOUNTER — Telehealth: Payer: Self-pay | Admitting: Family Medicine

## 2020-05-07 ENCOUNTER — Telehealth: Payer: Self-pay | Admitting: Critical Care Medicine

## 2020-05-07 NOTE — Telephone Encounter (Signed)
Call pt left VM to keep cardio appt today !Contact name and phone nr provided if call back

## 2020-05-07 NOTE — Progress Notes (Signed)
Cardiology Office Note   Date:  05/08/2020   ID:  Mary [name], DOB [birth year], MRN [id number]  PCP:  Antony Blackbird, MD    No chief complaint on file.    Wt Readings from Last 3 Encounters:  05/08/20 126 lb 12.8 oz (57.5 kg)  05/03/20 109 lb 12.6 oz (49.8 kg)  04/23/20 110 lb (49.9 kg)       History of Present Illness:  Mary [name] is a 59 y.o. female   with Severe MR (in setting of SBE) June 2014 ? S/p R mini-thoracotomy with mechanical MVR and TV repair 6/14 ? Post op c/b CHB >> resolved; no pacemaker needed ? Echocardiogram 02/2019: normal EF, normally functioning MVR and TV repair ? Echo 12/2019: EF 55-60, stable MVR, TV repair  Transient Complete Heart Block ? Holter 03/2014: asymptomatic CHB during sleep >> seen by EP Mary [name]); no PPM needed ? Holter 7/19: normal sinus rhythm, intermittent CHB during daytime >> seen by EP; PPM recommended; patient never called back to arrange ? Last seen by Dr. Lovena [name] 11/2019: no pacer indicated  Hx of syncope 03/2019; ILR recommended; no insurance coverage  S/p TIA in 05/2016 (sub therapeutic INR)  Sinus tachycardia ? Beta blocker tried in 5/17 but stopped due to high grade heart block >> seen by EP; no PPM needed  Cardiac Catheterization2014: No CAD Macrocytic anemia2/2 B12 deficiency  Used to see Dr. Saunders Revel.  Prior to that Dr. Aundra [name].  Implantable loop recorder was recommended but insurance would not pay for this.  Has chronic fatigue weakness cough tongue numbness lip swelling numbness decreased appetite.  Admitted back in April with symptomatic anemia as well as dehydration related to gastroenteritis and acute kidney injury.  Hemoglobin was 6.9 at the time.  Hemolysis work-up was unremarkable.  She does have a mechanical valve but her echocardiogram did not demonstrate any evidence of hemolysis related to mechanical valve.  She did have some dark stools and was recommended to go to the emergency room after I  saw her in follow-up in April 2021.  The patient declined at the time.  Hemoglobin was 10.6.  She is been seen by gastroenterology.  Lovenox bridge for any endoscopies needed.  Mechanical valve.  SHe had "failure to thrive" in July 2021.   Called in on 8/11 : "Spoke with pt who is reporting bilateral edema up to thighs - (rubbing together which has never happened). Was recently in the hospital and received a blood transfusion for anemia.  8/9 BUN less than 5 Crea 0.79 K+ 3.4.  Pt's wt on 7/29 here in the office was 110 lbs - today on her home scales wt is 132 lbs.  Pt denies any increase in SOB.  Is avoiding all NA+.  Does not want to go back to the hospital.  She is not ordered any type of diuretics.  Advised I will have Dr Marlou Porch to review and c/b with any new orders/recommendations."  Feels that her swelling is better since she started on the higher dose of Lasix. Leg elevation has helped as well.   Using a cane to walk.     Past Medical History:  Diagnosis Date  . Acute diastolic heart failure ([place]) 03/15/2013  . Anemia   . B12 deficiency   . Carotid stenosis    Carotid US 2/17: bilateral ICA 1-39% >> FU prn  . Childhood asthma   . Complete heart block ([place])   . Epigastric hernia 200's  . GERD (  gastroesophageal reflux disease)   . Heart murmur   . History of blood transfusion    "14 w/1st pregnancy; 2 w/last C-section" (03/15/2013)  . Hx of echocardiogram    a. Echo 4/16:  Mild focal basal septal hypertrophy, EF 60-65%, no RWMA, Mechanical MVR ok with mild central regurgitation and no perivalvular leak, small mobile density attached to valvular ring (1.5x1 cm) - post surgical changes vs SBE, mild LAE;   b. Echo 2/17:  Mild post wall LVH, EF 55-60%, no RWMA, mechanical MVR ok (mean 5 mmHg), normal RVSF  . Hypertension    no pcp   will go to [facility]  . Macrocytic anemia   . 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 S/P mitral valve replacement with metallic valve  3/76/2831   65mm Sorin Carbomedics mechanical prosthesis via right mini thoracotomy approach  . 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 Severe mitral regurgitation 04/22/2013  . Sinus tachycardia   . SVT (supraventricular tachycardia) ([place]) 03/16/2013  . TIA (transient ischemic attack)   . Tricuspid regurgitation     Past Surgical History:  Procedure Laterality Date  . APPENDECTOMY  [id number]  . BIOPSY  05/01/2020   Procedure: BIOPSY;  Surgeon: Mary Banister, MD;  Location: [place];  Service: Endoscopy;;  . CARDIAC CATHETERIZATION    . CARDIAC VALVE REPLACEMENT    . CESAREAN SECTION  1983  . CESAREAN SECTION WITH BILATERAL TUBAL LIGATION  1999  . COLONOSCOPY WITH PROPOFOL N/A 05/01/2020   Procedure: COLONOSCOPY WITH PROPOFOL;  Surgeon: Mary Banister, MD;  Location[facility] ENDOSCOPY;  Service: Endoscopy;  Laterality: N/A;  . ESOPHAGOGASTRODUODENOSCOPY (EGD) WITH PROPOFOL N/A 05/01/2020   Procedure: ESOPHAGOGASTRODUODENOSCOPY (EGD) WITH PROPOFOL;  Surgeon: Mary Banister, MD;  Location: [facility] ENDOSCOPY;  Service: Endoscopy;  Laterality: N/A;  . FEMORAL HERNIA REPAIR Right 05/23/2013   Procedure: HERNIA REPAIR FEMORAL;  Surgeon: Mary Alberts, MD;  Location: [facility];  Service: Open Heart Surgery;  Laterality: Right;  . INTRAOPERATIVE TRANSESOPHAGEAL ECHOCARDIOGRAM N/A 05/23/2013   Procedure: INTRAOPERATIVE TRANSESOPHAGEAL ECHOCARDIOGRAM;  Surgeon: Mary Alberts, MD;  Location: [place] [facility];  Service: Open Heart Surgery;  Laterality: N/A;  . LAPAROSCOPIC CHOLECYSTECTOMY  2003  . LEFT AND RIGHT HEART CATHETERIZATION WITH CORONARY ANGIOGRAM N/A 03/22/2013   Procedure: LEFT AND RIGHT HEART CATHETERIZATION WITH CORONARY ANGIOGRAM;  Surgeon: Mary Blanks, MD;  Location: [facility] CATH LAB;  Service: Cardiovascular;  Laterality: N/A;   . MINIMALLY INVASIVE TRICUSPID VALVE REPAIR Right 05/23/2013   Procedure: MINIMALLY INVASIVE TRICUSPID VALVE REPAIR;  Surgeon: Mary Alberts, MD;  Location: [place];  Service: Open Heart Surgery;  Laterality: Right;  . MITRAL VALVE REPLACEMENT N/A 05/23/2013   Procedure: MITRAL VALVE (MV) REPLACEMENT;  Surgeon: Mary Alberts, MD;  Location: [place];  Service: Open Heart Surgery;  Laterality: N/A;  . MULTIPLE EXTRACTIONS WITH ALVEOLOPLASTY N/A 04/04/2013   Procedure: Extraction of tooth #'s 1,8,9,13,14,15,23,24,25,26 with alveoloplasty and gross debridement of remaining teeth;  Surgeon: Lenn Cal, DDS;  Location: [facility];  Service: Oral Surgery;  Laterality: N/A;  . TEE WITHOUT CARDIOVERSION N/A 03/18/2013   Procedure: TRANSESOPHAGEAL ECHOCARDIOGRAM (TEE);  Surgeon: Larey Dresser, MD;  Location: [name];  Service: Cardiovascular;  Laterality: N/A;  . TEE WITHOUT CARDIOVERSION N/A 06/17/2013   Procedure: TRANSESOPHAGEAL ECHOCARDIOGRAM (TEE);  Surgeon: Lelon Perla, MD;  Location:  [facility] ENDOSCOPY;  Service: Cardiovascular;  Laterality: N/A;  . TUBAL LIGATION  1999     Current Outpatient Medications  Medication Sig Dispense Refill  . acetaminophen (TYLENOL) 500 MG tablet Take 1,000 mg by mouth every 6 (six) hours as needed for headache.    [name] aspirin EC 81 MG tablet Take 1 tablet (81 mg total) by mouth daily.    . folic acid (FOLVITE) 1 MG tablet Take 1 tablet (1 mg total) by mouth daily. 90 tablet 0  . furosemide (LASIX) 40 MG tablet Take 1 tablet (40 mg total) by mouth daily. 30 tablet 3  . gabapentin (NEURONTIN) 300 MG capsule Take 1 capsule (300 mg total) by mouth at bedtime. 30 capsule 2  . Hyprom-Naphaz-Polysorb-Zn Sulf (CLEAR EYES COMPLETE OP) Place 2 drops into both eyes daily as needed (red eyes).    . pantoprazole (PROTONIX) 40 MG tablet Take 1 tablet (40 mg total) by mouth daily. 90 tablet 0  . potassium chloride SA (KLOR-CON) 20 MEQ tablet Take 1 tablet (20 mEq total) by  mouth daily. 30 tablet 3  . warfarin (COUMADIN) 5 MG tablet Take 1.5 tablets Mon, Wed, Fri and 1 tablet all other days of the week or as directed by Coumadin clinic. (Patient taking differently: Take 5-7.5 mg by mouth See admin instructions. Take 7.5mg  tablet by mouth on  Mon, Wed, Fri., then take 5 mg tablet by mouth all other days of the week or as directed by Coumadin clinic.) 40 tablet 1   No current facility-administered medications for this visit.    Allergies:   Oxycodone    Social History:  The patient  reports that she quit smoking about 7 years ago. Her smoking use included cigarettes. She has a 18.00 pack-year smoking history. She has never used smokeless tobacco. She reports current alcohol use. She reports that she does not use drugs.   Family History:  The patient's family history includes Cancer in her father; Sarcoidosis in her sister; Stroke in her father.    ROS:  Please see the history of present illness.   Otherwise, review of systems are positive for leg swelling.   All other systems are reviewed and negative.    PHYSICAL EXAM: VS:  BP 100/62   Pulse (!) 120   Ht 5\' 2"  (1.575 m)   Wt 126 lb 12.8 oz (57.5 kg)   LMP 03/22/2013   PF 99 L/min   BMI 23.19 kg/m  , BMI Body mass index is 23.19 kg/m. GEN: Well nourished, well developed, in no acute distress  HEENT: normal  Neck: no JVD, carotid bruits, or masses Cardiac: RRR; no murmurs, rubs, or gallops;,bilateral leg pitting edema  Respiratory:  clear to auscultation bilaterally, normal work of breathing GI: soft, nontender, nondistended, + BS MS: no deformity or atrophy  Skin: warm and dry, no rash Neuro:  Strength and sensation are intact Psych: euthymic mood, full affect, anxious   Recent Labs: 12/29/2019: B Natriuretic Peptide 44.5; TSH 4.393 04/24/2020: ALT 18 05/04/2020: BUN <5; Creatinine, Ser 0.79; Hemoglobin 7.3; Magnesium 1.0; Platelets 232; Potassium 3.4; Sodium 141   Lipid Panel    Component Value  Date/Time   CHOL 189 05/31/2018 1108   TRIG 300 (H) 05/31/2018 1108   HDL 68 05/31/2018 1108   CHOLHDL 2.8 05/31/2018 1108   CHOLHDL 3.2 06/06/2016 0135   VLDL 22 06/06/2016 0135   LDLCALC 61 05/31/2018 1108     Other studies Reviewed: Additional studies/ records that were reviewed today  with results demonstrating: prior records reviewed.   ASSESSMENT AND PLAN:  1. S/p MVR: SBE prophylaxis.  Normal LVEF at last check.  2. Acute diastolic heart failure: evidence of volume overload.  Lasix was increased by Dr. Marlou Porch.   She seems to be improving.  COntinue with diuresis and leg elevation. 3. Anemia: s/p transfusion. Hbg 7.3 before transfusion in 8/21.  Check CBC and BMet today, per Dr. Marlou Porch.  Also Coumadin check.      Current medicines are reviewed at length with the patient today.  The patient concerns regarding her medicines were addressed.  The following changes have been made:  No change  Labs/ tests ordered today include:  No orders of the defined types were placed in this encounter.   Recommend 150 minutes/week of aerobic exercise Low fat, low carb, high fiber diet recommended  Disposition:   FU with Dr. Marlou Porch.   Signed, Larae Grooms, MD  05/08/2020 2:05 PM    [facility] [address], [place], [place]  [address] Phone: [phone]; Fax: [phone]

## 2020-05-07 NOTE — Telephone Encounter (Signed)
Copied from [place] [id number]. Topic: Quick Communication - Home Health Verbal Orders >> May 07, 2020 10:58 AM Jodie Echevaria wrote: Caller/Agency: Lucie Leather / [place] Number: [phone] Requesting OT/PT/Skilled Nursing/Social Work/Speech Therapy: PT  Frequency: 1 w 3 weeks

## 2020-05-07 NOTE — Telephone Encounter (Signed)
Mary [name].  I am not able to open your message to me.  This is a new message:  The patient has an appt today at cardiology : call her and tell her to go to that appt.  She needs outpt f/u with any provider here soon post hosp to take over for Dr Chapman Fitch to re est PCP

## 2020-05-07 NOTE — Telephone Encounter (Signed)
Please advise.   Copied from [place] [phone]. Topic: General - Other >> May 07, 2020 11:03 AM Jodie Echevaria wrote: Reason for CRM: Penni Bombard D request that a doctor please fax over orders for a 3 in 1 commode and a shower chair to [place] because patient is in need of these equipments. Please call Ph# [phone] if there are additional questions

## 2020-05-08 ENCOUNTER — Other Ambulatory Visit: Payer: Self-pay

## 2020-05-08 ENCOUNTER — Ambulatory Visit (INDEPENDENT_AMBULATORY_CARE_PROVIDER_SITE_OTHER): Payer: Self-pay | Admitting: *Deleted

## 2020-05-08 ENCOUNTER — Ambulatory Visit (INDEPENDENT_AMBULATORY_CARE_PROVIDER_SITE_OTHER): Payer: Self-pay | Admitting: Interventional Cardiology

## 2020-05-08 ENCOUNTER — Encounter: Payer: Self-pay | Admitting: Interventional Cardiology

## 2020-05-08 VITALS — BP 100/62 | HR 120 | Ht 62.0 in | Wt 126.8 lb

## 2020-05-08 DIAGNOSIS — Z7901 Long term (current) use of anticoagulants: Secondary | ICD-10-CM

## 2020-05-08 DIAGNOSIS — Z954 Presence of other heart-valve replacement: Secondary | ICD-10-CM

## 2020-05-08 DIAGNOSIS — D649 Anemia, unspecified: Secondary | ICD-10-CM

## 2020-05-08 DIAGNOSIS — Z5181 Encounter for therapeutic drug level monitoring: Secondary | ICD-10-CM

## 2020-05-08 DIAGNOSIS — M79605 Pain in left leg: Secondary | ICD-10-CM

## 2020-05-08 DIAGNOSIS — M79604 Pain in right leg: Secondary | ICD-10-CM

## 2020-05-08 LAB — POCT INR: INR: 3.4 — AB (ref 2.0–3.0)

## 2020-05-08 NOTE — Patient Instructions (Signed)
Description   Continue taking 1 tablet everyday except 1.5 tablets on Mondays, Wednesdays and Fridays. Recheck INR in 1 week.. Continue eating the same amount of leafy green vegetables.  Coumadin Clinic # [phone]

## 2020-05-08 NOTE — Telephone Encounter (Signed)
Provider is unable to give verbal orders for home health services since she has not evaluated patient. This will need to be discussed at her upcoming appointment.

## 2020-05-08 NOTE — Patient Instructions (Signed)
Medication Instructions:  Your physician recommends that you continue on your current medications as directed. Please refer to the Current Medication list given to you today.  *If you need a refill on your cardiac medications before your next appointment, please call your pharmacy*   Lab Work: TODAY: CBC, BMET  If you have labs (blood work) drawn today and your tests are completely normal, you will receive your results only by: [name] MyChart Message (if you have MyChart) OR . A paper copy in the mail If you have any lab test that is abnormal or we need to change your treatment, we will call you to review the results.   Testing/Procedures: None   Follow-Up: We will see what your results show and discuss with Dr. Marlou Porch

## 2020-05-09 LAB — BASIC METABOLIC PANEL
BUN/Creatinine Ratio: 5 — ABNORMAL LOW (ref 9–23)
BUN: 4 mg/dL — ABNORMAL LOW (ref 6–24)
CO2: 19 mmol/L — ABNORMAL LOW (ref 20–29)
Calcium: 8.4 mg/dL — ABNORMAL LOW (ref 8.7–10.2)
Chloride: 109 mmol/L — ABNORMAL HIGH (ref 96–106)
Creatinine, Ser: 0.78 mg/dL (ref 0.57–1.00)
GFR calc Af Amer: 98 mL/min/{1.73_m2} (ref >59)
GFR calc non Af Amer: 85 mL/min/{1.73_m2} (ref >59)
Glucose: 93 mg/dL (ref 65–99)
Potassium: 4 mmol/L (ref 3.5–5.2)
Sodium: 143 mmol/L (ref 134–144)

## 2020-05-09 LAB — CBC
Hematocrit: 29.4 % — ABNORMAL LOW (ref 34.0–46.6)
Hemoglobin: 10.6 g/dL — ABNORMAL LOW (ref 11.1–15.9)
MCH: 35.3 pg — ABNORMAL HIGH (ref 26.6–33.0)
MCHC: 36.1 g/dL — ABNORMAL HIGH (ref 31.5–35.7)
MCV: 98 fL — ABNORMAL HIGH (ref 79–97)
Platelets: 303 10*3/uL (ref 150–450)
RBC: 3 x10E6/uL — ABNORMAL LOW (ref 3.77–5.28)
RDW: 24.6 % — ABNORMAL HIGH (ref 11.7–15.4)
WBC: 8.8 10*3/uL (ref 3.4–10.8)

## 2020-05-11 ENCOUNTER — Telehealth: Payer: Self-pay | Admitting: Interventional Cardiology

## 2020-05-11 NOTE — Telephone Encounter (Signed)
-----   Message from Jettie Booze, MD sent at 05/11/2020 12:58 PM EDT ----- Hbg better. Normal renal function.  Dr. Marlou Porch patient.

## 2020-05-11 NOTE — Telephone Encounter (Signed)
    Patient calling for lab results 

## 2020-05-11 NOTE — Telephone Encounter (Signed)
The patient has been notified of the result and verbalized understanding.  All questions (if any) were answered. Cleon Gustin, RN 05/11/2020 4:59 PM

## 2020-05-12 ENCOUNTER — Ambulatory Visit: Payer: Self-pay | Attending: Nurse Practitioner | Admitting: Nurse Practitioner

## 2020-05-12 ENCOUNTER — Encounter: Payer: Self-pay | Admitting: Nurse Practitioner

## 2020-05-12 DIAGNOSIS — G629 Polyneuropathy, unspecified: Secondary | ICD-10-CM

## 2020-05-12 NOTE — Progress Notes (Signed)
Virtual Visit via Telephone Note Due to national recommendations of social distancing due to [place] 19, telehealth visit is felt to be most appropriate for this patient at this time.  I discussed the limitations, risks, security and privacy concerns of performing an evaluation and management service by telephone and the availability of in person appointments. I also discussed with the patient that there may be a patient responsible charge related to this service. The patient expressed understanding and agreed to proceed.    I connected with Mary [name] on 05/12/20  at   8:30 AM EDT  EDT by telephone and verified that I am speaking with the correct person using two identifiers.   Consent I discussed the limitations, risks, security and privacy concerns of performing an evaluation and management service by telephone and the availability of in person appointments. I also discussed with the patient that there may be a patient responsible charge related to this service. The patient expressed understanding and agreed to proceed.   Location of Patient: Private Residence     Location of Provider: [place] and [employer] Office    Persons participating in Telemedicine visit: Geryl Rankins FNP-BC [place]    History of Present Illness: Telemedicine visit for: Neuropathy  PMH significant for MR with mitral valve replacement on warfarin, carotid stenosis, transient CHG (PPM recommended however patient never called back to arrange), TIA (2/2 Subtherapeutic INR),syncope (ILR recommended however patient uninsured),  history of macrocytic anemia 2/2 B12 deficiency. Currently seeing Cardiology Dr. Aundra Dubin.  Patient has been advised TODAY to apply for financial assistance and schedule to see our financial counselor.   Endorses numbness and sharp pain in hands, feets (radiating up her legs) and her lips.  Onset 3-4 months.  Symptoms of numbness are constant. She has to  wait for a few minutes before she can stand when she first gets out of the bed. She denies any back pain or falls. She uses a cane for assistance with walking due to B/L foot numbness. She will need EMG study.  She was admitted to the hospital in July and treated for acute gastritis with anemia. She also underwent EGD and colonoscopy at that time. Was started on PPI as well. Unfortunately she did not discuss her neuropathy symptoms at that time although she states they were present while admitted.     Past Medical History:  Diagnosis Date  . Acute diastolic heart failure ([place]) 03/15/2013  . Anemia   . B12 deficiency   . Carotid stenosis    Carotid US 2/17: bilateral ICA 1-39% >> FU prn  . Childhood asthma   . Complete heart block ([place])   . Epigastric hernia 200's  . GERD (gastroesophageal reflux disease)   . Heart murmur   . History of blood transfusion    "14 w/1st pregnancy; 2 w/last C-section" (03/15/2013)  . Hx of echocardiogram    a. Echo 4/16:  Mild focal basal septal hypertrophy, EF 60-65%, no RWMA, Mechanical MVR ok with mild central regurgitation and no perivalvular leak, small mobile density attached to valvular ring (1.5x1 cm) - post surgical changes vs SBE, mild LAE;   b. Echo 2/17:  Mild post wall LVH, EF 55-60%, no RWMA, mechanical MVR ok (mean 5 mmHg), normal RVSF  . Hypertension    no pcp   will go to [facility]  . Macrocytic anemia   . 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 S/P mitral valve replacement with metallic valve  05/23/2013   78mm Sorin Carbomedics mechanical prosthesis via right mini thoracotomy approach  . 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   Complex valvuloplasty including Cor-matrix ECM patch augmentation of anterior and lateral leaflets with 58mm Edwards mc3 ring annuloplasty via right mini-thoracotomy approach  . Severe mitral regurgitation 04/22/2013  . Sinus tachycardia   . SVT (supraventricular tachycardia) ([place]) 03/16/2013  . TIA  (transient ischemic attack)   . Tricuspid regurgitation     Past Surgical History:  Procedure Laterality Date  . APPENDECTOMY  [id number]  . BIOPSY  05/01/2020   Procedure: BIOPSY;  Surgeon: Milus Banister, MD;  Location: [place];  Service: Endoscopy;;  . CARDIAC CATHETERIZATION    . CARDIAC VALVE REPLACEMENT    . CESAREAN SECTION  1983  . CESAREAN SECTION WITH BILATERAL TUBAL LIGATION  1999  . COLONOSCOPY WITH PROPOFOL N/A 05/01/2020   Procedure: COLONOSCOPY WITH PROPOFOL;  Surgeon: Milus Banister, MD;  Location: [facility] ENDOSCOPY;  Service: Endoscopy;  Laterality: N/A;  . ESOPHAGOGASTRODUODENOSCOPY (EGD) WITH PROPOFOL N/A 05/01/2020   Procedure: ESOPHAGOGASTRODUODENOSCOPY (EGD) WITH PROPOFOL;  Surgeon: Milus Banister, MD;  Location: [facility] ENDOSCOPY;  Service: Endoscopy;  Laterality: N/A;  . FEMORAL HERNIA REPAIR Right 05/23/2013   Procedure: HERNIA REPAIR FEMORAL;  Surgeon: Rexene Alberts, MD;  Location: [place];  Service: Open Heart Surgery;  Laterality: Right;  . INTRAOPERATIVE TRANSESOPHAGEAL ECHOCARDIOGRAM N/A 05/23/2013   Procedure: INTRAOPERATIVE TRANSESOPHAGEAL ECHOCARDIOGRAM;  Surgeon: Rexene Alberts, MD;  Location: [place];  Service: Open Heart Surgery;  Laterality: N/A;  . LAPAROSCOPIC CHOLECYSTECTOMY  2003  . LEFT AND RIGHT HEART CATHETERIZATION WITH CORONARY ANGIOGRAM N/A 03/22/2013   Procedure: LEFT AND RIGHT HEART CATHETERIZATION WITH CORONARY ANGIOGRAM;  Surgeon: Burnell Blanks, MD;  Location: [facility] CATH LAB;  Service: Cardiovascular;  Laterality: N/A;  . MINIMALLY INVASIVE TRICUSPID VALVE REPAIR Right 05/23/2013   Procedure: MINIMALLY INVASIVE TRICUSPID VALVE REPAIR;  Surgeon: Rexene Alberts, MD;  Location: [place];  Service: Open Heart Surgery;  Laterality: Right;  . MITRAL VALVE REPLACEMENT N/A 05/23/2013   Procedure: MITRAL VALVE (MV) REPLACEMENT;  Surgeon: Rexene Alberts, MD;  Location: [place];  Service: Open Heart Surgery;  Laterality: N/A;  . MULTIPLE EXTRACTIONS WITH  ALVEOLOPLASTY N/A 04/04/2013   Procedure: Extraction of tooth #'s 1,8,9,13,14,15,23,24,25,26 with alveoloplasty and gross debridement of remaining teeth;  Surgeon: Lenn Cal, DDS;  Location: [place];  Service: Oral Surgery;  Laterality: N/A;  . TEE WITHOUT CARDIOVERSION N/A 03/18/2013   Procedure: TRANSESOPHAGEAL ECHOCARDIOGRAM (TEE);  Surgeon: Larey Dresser, MD;  Location: [place];  Service: Cardiovascular;  Laterality: N/A;  . TEE WITHOUT CARDIOVERSION N/A 06/17/2013   Procedure: TRANSESOPHAGEAL ECHOCARDIOGRAM (TEE);  Surgeon: Lelon Perla, MD;  Location: [facility] ENDOSCOPY;  Service: Cardiovascular;  Laterality: N/A;  . TUBAL LIGATION  1999    Family History  Problem Relation Age of Onset  . Stroke Father   . Cancer Father   . Sarcoidosis Sister   . Heart attack Neg Hx   . Colon cancer Neg Hx   . Stomach cancer Neg Hx   . Pancreatic cancer Neg Hx   . Esophageal cancer Neg Hx     Social History   Socioeconomic History  . Marital status: Single    Spouse name: Not on file  . Number of children: Not on file  . Years of education: Not on file  . Highest education level: Not on file  Occupational History  . Not on file  Tobacco Use  .  Smoking status: Former Smoker    Packs/day: 0.50    Years: 36.00    Pack years: 18.00    Types: Cigarettes    Quit date: 03/15/2013    Years since quitting: 7.1  . Smokeless tobacco: Never Used  Vaping Use  . Vaping Use: Never used  Substance and Sexual Activity  . Alcohol use: Yes    Comment:  "glass of wine"  . Drug use: No  . Sexual activity: Never  Other Topics Concern  . Not on file  Social History Narrative  . Not on file   Social Determinants of Health   Financial Resource Strain:   . Difficulty of Paying Living Expenses: Not on file  Food Insecurity:   . Worried About Charity fundraiser in the Last Year: Not on file  . Ran Out of Food in the Last Year: Not on file  Transportation Needs:   . Lack of Transportation  (Medical): Not on file  . Lack of Transportation (Non-Medical): Not on file  Physical Activity:   . Days of Exercise per Week: Not on file  . Minutes of Exercise per Session: Not on file  Stress:   . Feeling of Stress : Not on file  Social Connections:   . Frequency of Communication with Friends and Family: Not on file  . Frequency of Social Gatherings with Friends and Family: Not on file  . Attends Religious Services: Not on file  . Active Member of Clubs or Organizations: Not on file  . Attends Archivist Meetings: Not on file  . Marital Status: Not on file     Observations/Objective: Awake, alert and oriented x 3   ROS  Assessment and Plan: Karleigh was seen today for pain.  Diagnoses and all orders for this visit:  Neuropathy -     VITAMIN D 25 Hydroxy (Vit-D Deficiency, Fractures); Future -     B12 and Folate Panel; Future -     Sedimentation Rate; Future -     ANA,IFA RA Diag Pnl w/rflx Tit/Patn; Future    Follow Up Instructions Return in about 6 weeks (around 06/23/2020).     I discussed the assessment and treatment plan with the patient. The patient was provided an opportunity to ask questions and all were answered. The patient agreed with the plan and demonstrated an understanding of the instructions.   The patient was advised to call back or seek an in-person evaluation if the symptoms worsen or if the condition fails to improve as anticipated.  I provided 19 minutes of non-face-to-face time during this encounter including median intraservice time, reviewing previous notes, labs, imaging, medications and explaining diagnosis and management.  Gildardo Pounds, FNP-BC

## 2020-05-15 ENCOUNTER — Other Ambulatory Visit: Payer: Self-pay

## 2020-05-15 ENCOUNTER — Ambulatory Visit (INDEPENDENT_AMBULATORY_CARE_PROVIDER_SITE_OTHER): Payer: Self-pay | Admitting: *Deleted

## 2020-05-15 ENCOUNTER — Encounter: Payer: Self-pay | Admitting: Nurse Practitioner

## 2020-05-15 DIAGNOSIS — Z5181 Encounter for therapeutic drug level monitoring: Secondary | ICD-10-CM

## 2020-05-15 DIAGNOSIS — Z7901 Long term (current) use of anticoagulants: Secondary | ICD-10-CM

## 2020-05-15 LAB — POCT INR: INR: 5.8 — AB (ref 2.0–3.0)

## 2020-05-15 NOTE — Patient Instructions (Signed)
Description   Hold warfarin today and tomorrow, then start taking 1 tablet daily except for 1.5 tablets on Monday and Fridays. Recheck INR in 1 week. Continue eating 1 serving of greens a week.  Coumadin Clinic # [phone]

## 2020-05-20 NOTE — Telephone Encounter (Signed)
Pt was seen in follow up as scheduled with Dr Irish Lack who stated she seems to be improving.  She had f/u lab work that day which Dr Marlou Porch reviewed.

## 2020-05-22 ENCOUNTER — Ambulatory Visit (INDEPENDENT_AMBULATORY_CARE_PROVIDER_SITE_OTHER): Payer: Self-pay | Admitting: *Deleted

## 2020-05-22 ENCOUNTER — Other Ambulatory Visit: Payer: Self-pay

## 2020-05-22 DIAGNOSIS — Z7901 Long term (current) use of anticoagulants: Secondary | ICD-10-CM

## 2020-05-22 DIAGNOSIS — Z5181 Encounter for therapeutic drug level monitoring: Secondary | ICD-10-CM

## 2020-05-22 LAB — POCT INR: INR: 5.2 — AB (ref 2.0–3.0)

## 2020-05-22 NOTE — Patient Instructions (Signed)
Description   Hold warfarin today and tomorrow, then start taking 1 tablet daily.  Recheck INR in 1 week. Continue eating 1 serving of greens a week.  Coumadin Clinic # [phone]

## 2020-05-26 ENCOUNTER — Telehealth: Payer: Self-pay | Admitting: Family Medicine

## 2020-05-26 NOTE — Telephone Encounter (Signed)
Returned call to [place] and gave verbal orders for additional PT.

## 2020-05-26 NOTE — Telephone Encounter (Signed)
Merry Proud PT with [facility] is calling and needs one additional PT visit for this patient

## 2020-05-26 NOTE — Telephone Encounter (Signed)
That is fine to give verbal order for additional PT visit.   Phill Myron, D.O. Primary Care at [facility]  05/26/2020, 3:17 PM

## 2020-05-27 ENCOUNTER — Ambulatory Visit: Payer: Medicaid Other | Admitting: Nurse Practitioner

## 2020-05-29 ENCOUNTER — Telehealth: Payer: Self-pay | Admitting: Family Medicine

## 2020-05-29 NOTE — Telephone Encounter (Signed)
Copied from [place] [phone]. Topic: General - Other >> May 29, 2020  9:32 AM Leward Quan A wrote: Reason for CRM: Patient called to say that she will be out of the gabapentin (NEURONTIN) capsule 300 mg come Sunday night and was asking for a refill from Dr Geryl Rankins or Dr Antony Blackbird. Asking for a call back today please since Monday is a holiday Ph# [phone]

## 2020-06-03 ENCOUNTER — Other Ambulatory Visit: Payer: Self-pay

## 2020-06-03 ENCOUNTER — Ambulatory Visit (INDEPENDENT_AMBULATORY_CARE_PROVIDER_SITE_OTHER): Payer: Self-pay | Admitting: *Deleted

## 2020-06-03 ENCOUNTER — Ambulatory Visit: Payer: Medicaid Other | Attending: Family Medicine

## 2020-06-03 DIAGNOSIS — Z7901 Long term (current) use of anticoagulants: Secondary | ICD-10-CM

## 2020-06-03 DIAGNOSIS — G629 Polyneuropathy, unspecified: Secondary | ICD-10-CM

## 2020-06-03 DIAGNOSIS — Z5181 Encounter for therapeutic drug level monitoring: Secondary | ICD-10-CM

## 2020-06-03 LAB — POCT INR: INR: 1.7 — AB (ref 2.0–3.0)

## 2020-06-03 NOTE — Patient Instructions (Addendum)
Description   Take 1.5 tablets of warfarin today and tomorrow, then start taking 1 tablet daily.  Continue eating 1 serving of greens a week. Recheck INR in 1 week.  Coumadin Clinic # [phone]

## 2020-06-03 NOTE — Progress Notes (Signed)
During the anti coag office visit today pt could not recall how she had been taking her warfarin and could not remember how many doses she missed. Pt stated if she was at home and had her pill box infront of her she would have a better idea, informed pt I could call her around 2 pm today.   Called and spoke to pt who was able to tell me how she had been taking her warfarin and what days she had missed, updated her anti coag track accordingly and updated gave her verbal updated dosing instructions.

## 2020-06-03 NOTE — Addendum Note (Signed)
Addended byMariane Baumgarten on: 06/03/2020 09:18 AM   Modules accepted: Orders

## 2020-06-03 NOTE — Telephone Encounter (Signed)
I did not see this medication listed on patient's chart- can you call patient and see who prescribed and the instructions for the medication

## 2020-06-05 ENCOUNTER — Other Ambulatory Visit: Payer: Self-pay | Admitting: Family Medicine

## 2020-06-05 DIAGNOSIS — G629 Polyneuropathy, unspecified: Secondary | ICD-10-CM

## 2020-06-05 LAB — ANA,IFA RA DIAG PNL W/RFLX TIT/PATN
ANA Titer 1: NEGATIVE
Cyclic Citrullin Peptide Ab: 9 units (ref 0–19)
Rheumatoid fact SerPl-aCnc: <10 IU/mL (ref 0.0–13.9)

## 2020-06-05 LAB — VITAMIN D 25 HYDROXY (VIT D DEFICIENCY, FRACTURES): Vit D, 25-Hydroxy: 6.4 ng/mL — ABNORMAL LOW (ref 30.0–100.0)

## 2020-06-05 LAB — B12 AND FOLATE PANEL
Folate: >20 ng/mL (ref >3.0)
Vitamin B-12: 597 pg/mL (ref 232–1245)

## 2020-06-05 LAB — SEDIMENTATION RATE: Sed Rate: 21 mm/hr (ref 0–40)

## 2020-06-05 MED ORDER — GABAPENTIN 300 MG PO CAPS: 300.0000 mg | ORAL_CAPSULE | Freq: Every day | ORAL | 0 refills | Status: DC

## 2020-06-05 NOTE — Telephone Encounter (Signed)
Prescription for gabapentin has been sent to the [place] at [facility].

## 2020-06-05 NOTE — Telephone Encounter (Signed)
Patient name and DOB verified.  She is requesting the gabapentin for neuropathy in lips, face, hands, and feet. She ambulates with a cane and states she has been out of this mediction x 1 week.   Please send to [place] [place] since [facility] pharmacy will be closed.   Gabapentin on medication list in May and August. pls veiw list under filter

## 2020-06-05 NOTE — Progress Notes (Signed)
Patient ID: Mary [name], female   DOB: 12-14-1960, 59 y.o.   MRN: [id number]   Patient left message requesting refill of gabapentin.  Message was sent to nursing to have them confirm need for gabapentin as this was not listed on patient's current medication list.  Patient was prescribed the medication in the past and she reports the need for refill due to symptoms of neuropathy.  On review of past refills, she has received gabapentin 300 mg at bedtime in the past and prescription will be sent for the medication to [facility] at [facility] per patient request.

## 2020-06-05 NOTE — Telephone Encounter (Signed)
Left message on voicemail to return call.  Enocounter was forwarded to [facility].

## 2020-06-07 ENCOUNTER — Other Ambulatory Visit: Payer: Self-pay | Admitting: Nurse Practitioner

## 2020-06-07 MED ORDER — VITAMIN D (ERGOCALCIFEROL) 1.25 MG (50000 UNIT) PO CAPS
50000.0000 [IU] | ORAL_CAPSULE | ORAL | 1 refills | Status: DC
Start: 2020-06-07 — End: 2020-10-06

## 2020-06-08 MED FILL — VIT D2 1.25 MG (50,000 UNIT: 1.25 MG | 84 days supply | Qty: 12 | Fill #0

## 2020-06-09 NOTE — Telephone Encounter (Signed)
Patient informed medication is at the pharmacy and was sent on 06/05/2020 at 7:30. Patient states she will call pharmacy. She has not been informed by pharmacy.  She states she did pick up Vit. D from [facility]. Will ensure patient is taking this Rx as prescribed.

## 2020-06-17 ENCOUNTER — Ambulatory Visit (INDEPENDENT_AMBULATORY_CARE_PROVIDER_SITE_OTHER): Payer: Medicaid Other | Admitting: *Deleted

## 2020-06-17 ENCOUNTER — Other Ambulatory Visit: Payer: Self-pay

## 2020-06-17 DIAGNOSIS — Z5181 Encounter for therapeutic drug level monitoring: Secondary | ICD-10-CM

## 2020-06-17 DIAGNOSIS — Z7901 Long term (current) use of anticoagulants: Secondary | ICD-10-CM

## 2020-06-17 LAB — CBC
Hematocrit: 27.6 % — ABNORMAL LOW (ref 34.0–46.6)
Hemoglobin: 9.6 g/dL — ABNORMAL LOW (ref 11.1–15.9)
MCH: 35.7 pg — ABNORMAL HIGH (ref 26.6–33.0)
MCHC: 34.8 g/dL (ref 31.5–35.7)
MCV: 103 fL — ABNORMAL HIGH (ref 79–97)
Platelets: 511 10*3/uL — ABNORMAL HIGH (ref 150–450)
RBC: 2.69 x10E6/uL — CL (ref 3.77–5.28)
RDW: 28.3 % — ABNORMAL HIGH (ref 11.7–15.4)
WBC: 6.9 10*3/uL (ref 3.4–10.8)

## 2020-06-17 LAB — PROTIME-INR
INR: 6.5 (ref 0.9–1.2)
Prothrombin Time: 64.2 s — ABNORMAL HIGH (ref 9.1–12.0)

## 2020-06-17 LAB — POCT INR: INR: 7.6 — AB (ref 2.0–3.0)

## 2020-06-17 NOTE — Progress Notes (Signed)
Sent pt to lab and instructed her to hold warfarin until we call her today and give her further dosing instructions. Pt is aware that she is at a higher risk for bleeding with elevated INR.   Also, talked to Dr. Marlou Porch and made him aware that pt had an elevated INR and saw blood in the toilet twice this past week on Sunday and Monday. Pt has not seen anymore blood since then.  Ordered for pt to have a CBC checked while she was having PT/INR drawn.   Pt has GI doctor, Dr. Havery Moros. Sent him a message to make him aware.   Called and spoke to pt and gave her updated dosing instructions based on lab INR.

## 2020-06-17 NOTE — Patient Instructions (Addendum)
Description   Called and spoke to pt and instructed her to hold warfarin today 9/22, 9/23, and 9/24 then start taking warfarin 1 tablet daily except for 1/2 a tablet on Sundays and Wednesdays. Recheck INR in 1 week. Coumadin Clinic # [phone]

## 2020-06-18 ENCOUNTER — Telehealth (INDEPENDENT_AMBULATORY_CARE_PROVIDER_SITE_OTHER): Payer: Self-pay | Admitting: Family Medicine

## 2020-06-18 NOTE — Telephone Encounter (Signed)
Spoke w/ pt and verified bleeding stopped, per chart review and pt report, she was seen and is managed by cardiology, provider there aware of bleeding and has given instructions, pt was advised by cardiologist to inform PCP and GI provider. Pt will follow-up here and at cardiology on 10/1. Pt given urgent precautions and verbalized understanding.

## 2020-06-18 NOTE — Telephone Encounter (Signed)
Copied from [place] [phone]. Topic: General - Other >> Jun 17, 2020 11:03 AM Leward Quan A wrote: Reason for CRM: Patient called to inform Dr Chapman Fitch that she was seen this AM had her Coumadin levels checked and they came back very high say that on first check it was 7. Something and then 6. Something. She also wanted to inform Dr Chapman Fitch of Rectal bleeding that she experienced on Monday 06/15/20 and Tuesday 06/16/20 with a significant amount of blood in the toilet, stating that she may need to be seen earlier than appointment that is scheduled for her on 06/26/20. Please call patient at Ph# [phone]

## 2020-06-22 ENCOUNTER — Other Ambulatory Visit: Payer: Self-pay

## 2020-06-22 DIAGNOSIS — D508 Other iron deficiency anemias: Secondary | ICD-10-CM

## 2020-06-22 DIAGNOSIS — Z7901 Long term (current) use of anticoagulants: Secondary | ICD-10-CM

## 2020-06-26 ENCOUNTER — Other Ambulatory Visit: Payer: Self-pay

## 2020-06-26 ENCOUNTER — Ambulatory Visit: Payer: Self-pay | Attending: Family Medicine | Admitting: Family Medicine

## 2020-06-26 VITALS — BP 132/82 | HR 102 | Temp 98.1°F | Ht 62.0 in | Wt 112.0 lb

## 2020-06-26 DIAGNOSIS — G629 Polyneuropathy, unspecified: Secondary | ICD-10-CM

## 2020-06-26 DIAGNOSIS — Z8639 Personal history of other endocrine, nutritional and metabolic disease: Secondary | ICD-10-CM

## 2020-06-26 DIAGNOSIS — D539 Nutritional anemia, unspecified: Secondary | ICD-10-CM

## 2020-06-26 DIAGNOSIS — R000 Tachycardia, unspecified: Secondary | ICD-10-CM

## 2020-06-26 DIAGNOSIS — Z7901 Long term (current) use of anticoagulants: Secondary | ICD-10-CM

## 2020-06-26 DIAGNOSIS — Z954 Presence of other heart-valve replacement: Secondary | ICD-10-CM

## 2020-06-26 MED ORDER — GABAPENTIN 100 MG PO CAPS: 100.0000 mg | ORAL_CAPSULE | Freq: Two times a day (BID) | ORAL | 3 refills | Status: DC

## 2020-06-26 MED FILL — GABAPENTIN 100 MG CAPSULE: 100 | 30 days supply | Qty: 60 | Fill #0

## 2020-06-26 NOTE — Progress Notes (Signed)
Established Patient Office Visit  Subjective:  Patient ID: Mary [name], female    DOB: 1960-11-13  Age: 59 y.o. MRN: [id number]  CC:  Chief Complaint  Patient presents with  . Numbness    HPI Mary [name], 59 year old medically complex patient, who is seen in follow-up of issues with neuropathy/numbness that she reports is in her hands, around her mouth and from below the knees down.  She also reports weakness in her arms and legs.  She has to use a cane to help with ambulation.  She is taking gabapentin 300 mg currently at night.  She is not sure that this makes a difference in the neuropathy however it causes her to fall asleep so that she is not aware of the neuropathy.  She does have uncomfortable stinging sensation, especially in her feet.  She would like to have something that she can take in the daytime as well to help with the neuropathy.           She continues to follow-up with cardiology regarding her cardiac issues including her history of valve replacement for which she is on Coumadin.  She reports that with her Coumadin levels were checked last week, her INR was high at 6.5.  She denies any unusual bruising or bleeding.  She denies any further syncopal episodes related to her heart block.  She does occasionally feel lightheaded and she knows that she either needs to sit down or to find a place to sit as she does not wish to pass out again.  Past Medical History:  Diagnosis Date  . Acute diastolic heart failure ([place]) 03/15/2013  . Anemia   . B12 deficiency   . Carotid stenosis    Carotid US 2/17: bilateral ICA 1-39% >> FU prn  . Childhood asthma   . Complete heart block ([place])   . Epigastric hernia 200's  . GERD (gastroesophageal reflux disease)   . Heart murmur   . History of blood transfusion    "14 w/1st pregnancy; 2 w/last C-section" (03/15/2013)  . Hx of echocardiogram    a. Echo 4/16:  Mild focal basal septal hypertrophy, EF 60-65%, no RWMA, Mechanical MVR ok  with mild central regurgitation and no perivalvular leak, small mobile density attached to valvular ring (1.5x1 cm) - post surgical changes vs SBE, mild LAE;   b. Echo 2/17:  Mild post wall LVH, EF 55-60%, no RWMA, mechanical MVR ok (mean 5 mmHg), normal RVSF  . Hypertension    no pcp   will go to [facility]  . Macrocytic anemia   . 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 S/P mitral valve replacement with metallic valve 09/28/7251   45mm Sorin Carbomedics mechanical prosthesis via right mini thoracotomy approach  . 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   Complex valvuloplasty including Cor-matrix ECM patch augmentation of anterior and lateral leaflets with 18mm Edwards mc3 ring annuloplasty via right mini-thoracotomy approach  . Severe mitral regurgitation 04/22/2013  . Sinus tachycardia   . SVT (supraventricular tachycardia) ([place]) 03/16/2013  . TIA (transient ischemic attack)   . Tricuspid regurgitation     Past Surgical History:  Procedure Laterality Date  . APPENDECTOMY  [id number]  . BIOPSY  05/01/2020   Procedure: BIOPSY;  Surgeon: Milus Banister, MD;  Location: [place];  Service: Endoscopy;;  . CARDIAC CATHETERIZATION    . CARDIAC VALVE REPLACEMENT    . CESAREAN SECTION  1983  . CESAREAN SECTION WITH BILATERAL TUBAL LIGATION  1999  .  COLONOSCOPY WITH PROPOFOL N/A 05/01/2020   Procedure: COLONOSCOPY WITH PROPOFOL;  Surgeon: Milus Banister, MD;  Location: [facility] ENDOSCOPY;  Service: Endoscopy;  Laterality: N/A;  . ESOPHAGOGASTRODUODENOSCOPY (EGD) WITH PROPOFOL N/A 05/01/2020   Procedure: ESOPHAGOGASTRODUODENOSCOPY (EGD) WITH PROPOFOL;  Surgeon: Milus Banister, MD;  Location: [facility] END[facility]COPY;  Service: Endoscopy;  Laterality: N/A;  . FEMORAL HERNIA REPAIR Right 05/23/2013   Procedure: HERNIA REPAIR FEMORAL;  Surgeon: Rexene Alberts, MD;  Location: [place];  Service: Open Heart Surgery;  Laterality: Right;  . INTRAOPERATIVE TRANSESOPHAGEAL ECHOCARDIOGRAM N/A 05/23/2013   Procedure:  INTRAOPERATIVE TRANSESOPHAGEAL ECHOCARDIOGRAM;  Surgeon: Rexene Alberts, MD;  Location: [place];  Service: Open Heart Surgery;  Laterality: N/A;  . LAPAROSCOPIC CHOLECYSTECTOMY  2003  . LEFT AND RIGHT HEART CATHETERIZATION WITH CORONARY ANGIOGRAM N/A 03/22/2013   Procedure: LEFT AND RIGHT HEART CATHETERIZATION WITH CORONARY ANGIOGRAM;  Surgeon: Burnell Blanks, MD;  Location: [facility] CATH LAB;  Service: Cardiovascular;  Laterality: N/A;  . MINIMALLY INVASIVE TRICUSPID VALVE REPAIR Right 05/23/2013   Procedure: MINIMALLY INVASIVE TRICUSPID VALVE REPAIR;  Surgeon: Rexene Alberts, MD;  Location: [place];  Service: Open Heart Surgery;  Laterality: Right;  . MITRAL VALVE REPLACEMENT N/A 05/23/2013   Procedure: MITRAL VALVE (MV) REPLACEMENT;  Surgeon: Rexene Alberts, MD;  Location: [place];  Service: Open Heart Surgery;  Laterality: N/A;  . MULTIPLE EXTRACTIONS WITH ALVEOLOPLASTY N/A 04/04/2013   Procedure: Extraction of tooth #'s 1,8,9,13,14,15,23,24,25,26 with alveoloplasty and gross debridement of remaining teeth;  Surgeon: Lenn Cal, DDS;  Location: [place];  Service: Oral Surgery;  Laterality: N/A;  . TEE WITHOUT CARDIOVERSION N/A 03/18/2013   Procedure: TRANSESOPHAGEAL ECHOCARDIOGRAM (TEE);  Surgeon: Larey Dresser, MD;  Location: [place];  Service: Cardiovascular;  Laterality: N/A;  . TEE WITHOUT CARDIOVERSION N/A 06/17/2013   Procedure: TRANSESOPHAGEAL ECHOCARDIOGRAM (TEE);  Surgeon: Lelon Perla, MD;  Location: [facility] ENDOSCOPY;  Service: Cardiovascular;  Laterality: N/A;  . TUBAL LIGATION  1999    Family History  Problem Relation Age of Onset  . Stroke Father   . Cancer Father   . Sarcoidosis Sister   . Heart attack Neg Hx   . Colon cancer Neg Hx   . Stomach cancer Neg Hx   . Pancreatic cancer Neg Hx   . Esophageal cancer Neg Hx     Social History   Socioeconomic History  . Marital status: Single    Spouse name: Not on file  . Number of children: Not on file  . Years of  education: Not on file  . Highest education level: Not on file  Occupational History  . Not on file  Tobacco Use  . Smoking status: Former Smoker    Packs/day: 0.50    Years: 36.00    Pack years: 18.00    Types: Cigarettes    Quit date: 03/15/2013    Years since quitting: 7.2  . Smokeless tobacco: Never Used  Vaping Use  . Vaping Use: Never used  Substance and Sexual Activity  . Alcohol use: Yes    Comment:  "glass of wine"  . Drug use: No  . Sexual activity: Never  Other Topics Concern  . Not on file  Social History Narrative  . Not on file   Social Determinants of Health   Financial Resource Strain:   . Difficulty of Paying Living Expenses: Not on file  Food Insecurity:   . Worried About Charity fundraiser in the Last Year: Not on file  .  Ran Out of Food in the Last Year: Not on file  Transportation Needs:   . Lack of Transportation (Medical): Not on file  . Lack of Transportation (Non-Medical): Not on file  Physical Activity:   . Days of Exercise per Week: Not on file  . Minutes of Exercise per Session: Not on file  Stress:   . Feeling of Stress : Not on file  Social Connections:   . Frequency of Communication with Friends and Family: Not on file  . Frequency of Social Gatherings with Friends and Family: Not on file  . Attends Religious Services: Not on file  . Active Member of Clubs or Organizations: Not on file  . Attends Archivist Meetings: Not on file  . Marital Status: Not on file  Intimate Partner Violence:   . Fear of Current or Ex-Partner: Not on file  . Emotionally Abused: Not on file  . Physically Abused: Not on file  . Sexually Abused: Not on file    Outpatient Medications Prior to Visit  Medication Sig Dispense Refill  . acetaminophen (TYLENOL) 500 MG tablet Take 1,000 mg by mouth every 6 (six) hours as needed for headache.    [name] aspirin EC 81 MG tablet Take 1 tablet (81 mg total) by mouth daily.    . folic acid (FOLVITE) 1 MG tablet  Take 1 tablet (1 mg total) by mouth daily. 90 tablet 0  . gabapentin (NEURONTIN) 300 MG capsule Take 1 capsule (300 mg total) by mouth at bedtime. As needed for nerve pain 90 capsule 0  . Hyprom-Naphaz-Polysorb-Zn Sulf (CLEAR EYES COMPLETE OP) Place 2 drops into both eyes daily as needed (red eyes).    . pantoprazole (PROTONIX) 40 MG tablet Take 1 tablet (40 mg total) by mouth daily. 90 tablet 0  . Vitamin D, Ergocalciferol, (DRISDOL) 1.25 MG (50000 UNIT) CAPS capsule Take 1 capsule (50,000 Units total) by mouth every 7 (seven) days. 12 capsule 1  . warfarin (COUMADIN) 5 MG tablet Take 1.5 tablets Mon, Wed, Fri and 1 tablet all other days of the week or as directed by Coumadin clinic. (Patient taking differently: Take 5-7.5 mg by mouth See admin instructions. Take 7.5mg  tablet by mouth on  Mon, Wed, Fri., then take 5 mg tablet by mouth all other days of the week or as directed by Coumadin clinic.) 40 tablet 1  . furosemide (LASIX) 40 MG tablet Take 1 tablet (40 mg total) by mouth daily. (Patient not taking: Reported on 06/26/2020) 30 tablet 3  . potassium chloride SA (KLOR-CON) 20 MEQ tablet Take 1 tablet (20 mEq total) by mouth daily. (Patient not taking: Reported on 06/26/2020) 30 tablet 3   No facility-administered medications prior to visit.    Allergies  Allergen Reactions  . Oxycodone Itching    ROS Review of Systems  Constitutional: Positive for fatigue. Negative for chills and fever.  HENT: Negative for sore throat and trouble swallowing.   Respiratory: Negative for cough and shortness of breath.   Cardiovascular: Negative for chest pain and palpitations.  Gastrointestinal: Positive for constipation (occasional). Negative for abdominal pain, diarrhea and nausea.  Endocrine: Negative for polydipsia, polyphagia and polyuria.  Genitourinary: Negative for dysuria and frequency.  Musculoskeletal: Positive for gait problem. Negative for arthralgias and back pain.  Skin: Negative for rash  and wound.  Neurological: Positive for weakness and light-headedness. Negative for dizziness and headaches.  Hematological: Negative for adenopathy. Does not bruise/bleed easily.  Psychiatric/Behavioral: Negative for suicidal ideas. The patient  is nervous/anxious (about her health).       Objective:    Physical Exam Vitals and nursing note reviewed.  Constitutional:      General: She is not in acute distress.    Appearance: Normal appearance.     Comments: WNWD older female who appears to have lost weight since she was last seen in the office. Slight exopthalmic appearance to the eyes. Patient sitting on a chair in the exam room with cane leaned against a nearby wall.   Neck:     Vascular: No carotid bruit.  Cardiovascular:     Rate and Rhythm: Regular rhythm. Tachycardia present.     Comments: Mild tachycardia Pulmonary:     Effort: Pulmonary effort is normal.     Breath sounds: Normal breath sounds.  Abdominal:     Palpations: Abdomen is soft.     Tenderness: There is no abdominal tenderness. There is no right CVA tenderness, left CVA tenderness, guarding or rebound.  Musculoskeletal:        General: No tenderness.     Cervical back: Normal range of motion and neck supple.     Right lower leg: No edema.     Left lower leg: No edema.  Lymphadenopathy:     Cervical: No cervical adenopathy.  Skin:    General: Skin is warm and dry.  Neurological:     Mental Status: She is alert and oriented to person, place, and time.     Motor: Weakness (4/5 upper and lower extremity weakness) present.  Psychiatric:        Mood and Affect: Mood normal.        Behavior: Behavior normal.     BP 132/82 (BP Location: Left Arm, Patient Position: Sitting)   Pulse (!) 102   Temp 98.1 F (36.7 C)   Ht 5\' 2"  (1.575 m)   Wt 112 lb (50.8 kg)   LMP 03/22/2013   SpO2 100%   BMI 20.49 kg/m  Wt Readings from Last 3 Encounters:  06/26/20 112 lb (50.8 kg)  05/08/20 126 lb 12.8 oz (57.5 kg)    05/03/20 109 lb 12.6 oz (49.8 kg)     Health Maintenance Due  Topic Date Due  . COVID-19 Vaccine (1) Never done  . TETANUS/TDAP  Never done  . PAP SMEAR-Modifier  07/28/2007  . MAMMOGRAM  Never done  . INFLUENZA VACCINE  Never done     Lab Results  Component Value Date   TSH 4.393 12/29/2019   Lab Results  Component Value Date   WBC 6.9 06/17/2020   HGB 9.6 (L) 06/17/2020   HCT 27.6 (L) 06/17/2020   MCV 103 (H) 06/17/2020   PLT 511 (H) 06/17/2020   Lab Results  Component Value Date   NA 143 05/08/2020   K 4.0 05/08/2020   CO2 19 (L) 05/08/2020   GLUCOSE 93 05/08/2020   BUN 4 (L) 05/08/2020   CREATININE 0.78 05/08/2020   BILITOT 0.8 04/24/2020   ALKPHOS 123 04/24/2020   AST 36 04/24/2020   ALT 18 04/24/2020   PROT 5.4 (L) 04/24/2020   ALBUMIN 2.6 (L) 04/24/2020   CALCIUM 8.4 (L) 05/08/2020   ANIONGAP 6 05/04/2020   GFR 83.38 01/01/2015   Lab Results  Component Value Date   CHOL 189 05/31/2018   Lab Results  Component Value Date   HDL 68 05/31/2018   Lab Results  Component Value Date   LDLCALC 61 05/31/2018   Lab Results  Component Value Date  TRIG 300 (H) 05/31/2018   Lab Results  Component Value Date   CHOLHDL 2.8 05/31/2018   Lab Results  Component Value Date   HGBA1C 4.8 06/06/2016      Assessment & Plan:  1. Neuropathy Patient with complaint of numbness in both hands as well as both lower extremities below the knees.  We discussed with the patient that she will be referred to neurology for further evaluation and she likely needs EMG nerve testing and may also need MRI to see if she may have some cervical and lumbar spinal stenosis contributing to her symptoms.  On examination, she does have a positive Tinel at the left wrist suggestive of carpal tunnel syndrome but this would not explain the bilateral nature of her neuropathy and weakness.  She does have history of vitamin B12 deficiency as well as recent gastritis on EGD.  She will have a  vitamin B12 level and is encouraged to restart her vitamin B12 supplement.  She will also have a CBC in follow-up of macrocytic anemia.  She is currently taking gabapentin 300 mg at bedtime which she finds helpful is not taking any additional daytime medication therefore RX provided for gabapentin 100 mg which she may take twice daily during the daytime.  She is not sure if the nighttime gabapentin helps but she states that it puts her to sleep therefore she does not feel any discomfort from her neuropathy after taking the nighttime dose of gabapentin. - CBC with Differential - Vitamin B12 - Ambulatory referral to Neurology - gabapentin (NEURONTIN) 100 MG capsule; Take 1 capsule (100 mg total) by mouth 2 (two) times daily. And continue 300 mg at bedtime  Dispense: 180 capsule; Refill: 3  2. Macrocytic anemia; 6. history of vitamin B12 anemia Patient with macrocytic anemia on recent blood work done by gastroenterology.  She will have repeat CBC in follow-up of anemia as well as vitamin B12 level as she has had a prior vitamin D deficiency and is not currently on any type of vitamin B12 supplement. - CBC with Differential - Vitamin B12  3. Sinus tachycardia Patient with sinus tachycardia which may be related to her anemia but will also check thyroid blood work to look for possible hyperthyroidism as a contributing cause and also check electrolytes via BMP.  She does have a history of sinus tachycardia which has been evaluated by cardiology. - Thyroid Panel With TSH - Basic Metabolic Panel - CBC  4.  Status post mitral valve replacement with metallic valve; 5. warfarin anticoagulation She reports that when she recently saw her cardiologist in follow-up of her Coumadin level, her INR was elevated at 6.5.  She has not noticed any unusual bruising or bleeding but she does have a history of anemia as well as gastritis.  Will repeat CBC to see if there has been any further decline in her hemoglobin and  depending on the results, she will be contacted regarding the need for further evaluation or treatment.   Follow-up: Return in about 8 weeks (around 08/21/2020) for anemia/neuropathy.  Call or return sooner if needed.   Antony Blackbird, MD

## 2020-06-26 NOTE — Progress Notes (Signed)
Experiencing numbness in legs, hands and lips

## 2020-06-27 LAB — CBC WITH DIFFERENTIAL/PLATELET
Basophils Absolute: 0 x10E3/uL (ref 0.0–0.2)
Basos: 0 %
EOS (ABSOLUTE): 0.2 x10E3/uL (ref 0.0–0.4)
Eos: 2 %
Hematocrit: 24.8 % — ABNORMAL LOW (ref 34.0–46.6)
Hemoglobin: 8.6 g/dL — ABNORMAL LOW (ref 11.1–15.9)
Immature Grans (Abs): 0.1 x10E3/uL (ref 0.0–0.1)
Immature Granulocytes: 1 %
Lymphocytes Absolute: 1.9 x10E3/uL (ref 0.7–3.1)
Lymphs: 18 %
MCH: 37.7 pg — ABNORMAL HIGH (ref 26.6–33.0)
MCHC: 34.7 g/dL (ref 31.5–35.7)
MCV: 109 fL — ABNORMAL HIGH (ref 79–97)
Monocytes Absolute: 0.6 x10E3/uL (ref 0.1–0.9)
Monocytes: 6 %
Neutrophils Absolute: 7.7 x10E3/uL — ABNORMAL HIGH (ref 1.4–7.0)
Neutrophils: 73 %
Platelets: 445 x10E3/uL (ref 150–450)
RBC: 2.28 x10E6/uL — CL (ref 3.77–5.28)
RDW: 26.5 % — ABNORMAL HIGH (ref 11.7–15.4)
WBC: 10.5 x10E3/uL (ref 3.4–10.8)

## 2020-06-27 LAB — BASIC METABOLIC PANEL WITH GFR
BUN/Creatinine Ratio: 10 (ref 9–23)
BUN: 12 mg/dL (ref 6–24)
CO2: 20 mmol/L (ref 20–29)
Calcium: 9.3 mg/dL (ref 8.7–10.2)
Chloride: 100 mmol/L (ref 96–106)
Creatinine, Ser: 1.15 mg/dL — ABNORMAL HIGH (ref 0.57–1.00)
GFR calc Af Amer: 61 mL/min/1.73
GFR calc non Af Amer: 53 mL/min/1.73 — ABNORMAL LOW
Glucose: 110 mg/dL — ABNORMAL HIGH (ref 65–99)
Potassium: 5.8 mmol/L — ABNORMAL HIGH (ref 3.5–5.2)
Sodium: 136 mmol/L (ref 134–144)

## 2020-06-27 LAB — THYROID PANEL WITH TSH
Free Thyroxine Index: 1.1 — ABNORMAL LOW (ref 1.2–4.9)
T3 Uptake Ratio: 27 % (ref 24–39)
T4, Total: 4 ug/dL — ABNORMAL LOW (ref 4.5–12.0)
TSH: 2.17 u[IU]/mL (ref 0.450–4.500)

## 2020-06-27 LAB — VITAMIN B12: Vitamin B-12: 333 pg/mL (ref 232–1245)

## 2020-06-28 ENCOUNTER — Other Ambulatory Visit: Payer: Self-pay | Admitting: Family Medicine

## 2020-06-28 DIAGNOSIS — D539 Nutritional anemia, unspecified: Secondary | ICD-10-CM

## 2020-06-28 DIAGNOSIS — E875 Hyperkalemia: Secondary | ICD-10-CM

## 2020-06-28 DIAGNOSIS — Z7901 Long term (current) use of anticoagulants: Secondary | ICD-10-CM

## 2020-06-28 DIAGNOSIS — Z954 Presence of other heart-valve replacement: Secondary | ICD-10-CM

## 2020-06-28 DIAGNOSIS — N289 Disorder of kidney and ureter, unspecified: Secondary | ICD-10-CM

## 2020-06-28 MED ORDER — VITAMIN B-12 1000 MCG PO TABS: 1000.0000 ug | ORAL_TABLET | Freq: Every day | ORAL | 1 refills | Status: DC

## 2020-06-30 ENCOUNTER — Other Ambulatory Visit: Payer: Self-pay

## 2020-06-30 ENCOUNTER — Encounter: Payer: Self-pay | Admitting: Family Medicine

## 2020-06-30 ENCOUNTER — Ambulatory Visit (INDEPENDENT_AMBULATORY_CARE_PROVIDER_SITE_OTHER): Payer: Self-pay | Admitting: *Deleted

## 2020-06-30 DIAGNOSIS — Z5181 Encounter for therapeutic drug level monitoring: Secondary | ICD-10-CM

## 2020-06-30 DIAGNOSIS — Z7901 Long term (current) use of anticoagulants: Secondary | ICD-10-CM

## 2020-06-30 LAB — POCT INR: INR: 1.8 — AB (ref 2.0–3.0)

## 2020-06-30 NOTE — Patient Instructions (Signed)
Description   Take 1.5 tablets today and 1 tablet tomorrow, then continue to take 1 tablet daily except for 1/2 a tablet on Sunday and Wednesday. Recheck INR in 1 week. Coumadin Clinic # [phone]

## 2020-07-01 ENCOUNTER — Telehealth: Payer: Self-pay | Admitting: Family Medicine

## 2020-07-01 NOTE — Telephone Encounter (Signed)
Plan of care and orders for PT from August 13st were faxed to office and [facility] has not received these back yet/   PT for 1x a week for 1 week for 8.31.21/ please advise asap  Fax# [phone]

## 2020-07-01 NOTE — Telephone Encounter (Signed)
Orders refaxed to fax # provided, conf. sheet received

## 2020-07-03 ENCOUNTER — Other Ambulatory Visit: Payer: Self-pay

## 2020-07-03 ENCOUNTER — Ambulatory Visit: Payer: Self-pay | Attending: Family Medicine

## 2020-07-03 DIAGNOSIS — Z954 Presence of other heart-valve replacement: Secondary | ICD-10-CM

## 2020-07-03 DIAGNOSIS — N289 Disorder of kidney and ureter, unspecified: Secondary | ICD-10-CM

## 2020-07-03 DIAGNOSIS — E875 Hyperkalemia: Secondary | ICD-10-CM

## 2020-07-03 DIAGNOSIS — Z7901 Long term (current) use of anticoagulants: Secondary | ICD-10-CM

## 2020-07-03 DIAGNOSIS — D539 Nutritional anemia, unspecified: Secondary | ICD-10-CM

## 2020-07-03 DIAGNOSIS — D508 Other iron deficiency anemias: Secondary | ICD-10-CM

## 2020-07-04 LAB — BASIC METABOLIC PANEL WITH GFR
BUN/Creatinine Ratio: 9 (ref 9–23)
BUN: 7 mg/dL (ref 6–24)
CO2: 21 mmol/L (ref 20–29)
Calcium: 9.3 mg/dL (ref 8.7–10.2)
Chloride: 105 mmol/L (ref 96–106)
Creatinine, Ser: 0.82 mg/dL (ref 0.57–1.00)
GFR calc Af Amer: 92 mL/min/1.73
GFR calc non Af Amer: 80 mL/min/1.73
Glucose: 84 mg/dL (ref 65–99)
Potassium: 4.5 mmol/L (ref 3.5–5.2)
Sodium: 139 mmol/L (ref 134–144)

## 2020-07-04 LAB — HEMOGLOBIN AND HEMATOCRIT, BLOOD
Hematocrit: 25 % — ABNORMAL LOW (ref 34.0–46.6)
Hemoglobin: 8.6 g/dL — ABNORMAL LOW (ref 11.1–15.9)

## 2020-07-10 ENCOUNTER — Ambulatory Visit (INDEPENDENT_AMBULATORY_CARE_PROVIDER_SITE_OTHER): Payer: Self-pay | Admitting: *Deleted

## 2020-07-10 ENCOUNTER — Other Ambulatory Visit: Payer: Self-pay

## 2020-07-10 ENCOUNTER — Ambulatory Visit: Payer: Self-pay | Admitting: *Deleted

## 2020-07-10 DIAGNOSIS — Z7901 Long term (current) use of anticoagulants: Secondary | ICD-10-CM

## 2020-07-10 LAB — POCT INR: INR: 5 — AB (ref 2.0–3.0)

## 2020-07-10 NOTE — Patient Instructions (Signed)
Description   Do not take any Warfarin today and no Warfarin tomorrow then continue taking 1 tablet daily except for 1/2 tablet on Sunday and Wednesday. Recheck INR in 1 week. Coumadin Clinic # [phone]

## 2020-07-10 NOTE — Telephone Encounter (Signed)
Patient has been scheduled for appointment on Monday for her diarrhea- patient is having moderate diarrhea and she does not have control at times. Patient is not taking anything for her symptoms. Patient prefers to wait for Monday appointment- advised OTC treatment, fluid and bland diet. Patient also advised if she has increasing symptoms- go to UC for evaluation.  Reason for Disposition . [1] MODERATE diarrhea (e.g., 4-6 times / day more than normal) AND [2] present > 48 hours (2 days)  Answer Assessment - Initial Assessment Questions 1. DIARRHEA SEVERITY: "How bad is the diarrhea?" "How many extra stools have you had in the past 24 hours than normal?"    - NO DIARRHEA (SCALE 0)   - MILD (SCALE 1-3): Few loose or mushy BMs; increase of 1-3 stools over normal daily number of stools; mild increase in ostomy output.   -  MODERATE (SCALE 4-7): Increase of 4-6 stools daily over normal; moderate increase in ostomy output. * SEVERE (SCALE 8-10; OR 'WORST POSSIBLE'): Increase of 7 or more stools daily over normal; moderate increase in ostomy output; incontinence.     moderate 2. ONSET: "When did the diarrhea begin?"      3-4 days 3. BM CONSISTENCY: "How loose or watery is the diarrhea?"      loose- 1 watery 4. VOMITING: "Are you also vomiting?" If Yes, ask: "How many times in the past 24 hours?"      no 5. ABDOMINAL PAIN: "Are you having any abdominal pain?" If Yes, ask: "What does it feel like?" (e.g., crampy, dull, intermittent, constant)      Yes- pain when has to have BM 6. ABDOMINAL PAIN SEVERITY: If present, ask: "How bad is the pain?"  (e.g., Scale 1-10; mild, moderate, or severe)   - MILD (1-3): doesn't interfere with normal activities, abdomen soft and not tender to touch    - MODERATE (4-7): interferes with normal activities or awakens from sleep, tender to touch    - SEVERE (8-10): excruciating pain, doubled over, unable to do any normal activities       Mild/moderate 7. ORAL INTAKE: If  vomiting, "Have you been able to drink liquids?" "How much fluids have you had in the past 24 hours?"     n/a 8. HYDRATION: "Any signs of dehydration?" (e.g., dry mouth [not just dry lips], too weak to stand, dizziness, new weight loss) "When did you last urinate?"     Water/ginger ale, loss of 2-3 lbs 9. EXPOSURE: "Have you traveled to a foreign country recently?" "Have you been exposed to anyone with diarrhea?" "Could you have eaten any food that was spoiled?"     no 10. ANTIBIOTIC USE: "Are you taking antibiotics now or have you taken antibiotics in the past 2 months?"       no 11. OTHER SYMPTOMS: "Do you have any other symptoms?" (e.g., fever, blood in stool)       no 12. PREGNANCY: "Is there any chance you are pregnant?" "When was your last menstrual period?"       n/a  Protocols used: DIARRHEA-A-AH

## 2020-07-13 ENCOUNTER — Ambulatory Visit: Payer: Medicaid Other | Attending: Family | Admitting: Family

## 2020-07-13 ENCOUNTER — Other Ambulatory Visit: Payer: Self-pay

## 2020-07-13 DIAGNOSIS — R197 Diarrhea, unspecified: Secondary | ICD-10-CM

## 2020-07-13 NOTE — Progress Notes (Addendum)
Virtual Visit via Telephone Note  I connected with Mary [name], on 07/13/2020 at 2:43 PM by telephone due to the COVID-19 pandemic and verified that I am speaking with the correct person using two identifiers.  Due to current restrictions/limitations of in-office visits due to the COVID-19 pandemic, this scheduled clinical appointment was converted to a telehealth visit.   Consent: I discussed the limitations, risks, security and privacy concerns of performing an evaluation and management service by telephone and the availability of in person appointments. I also discussed with the patient that there may be a patient responsible charge related to this service. The patient expressed understanding and agreed to proceed.  Location of Patient: Home  Location of Provider: [place]  Persons participating in Telemedicine visit: [place], NP Doloris Hall, [place]  History of Present Illness: Mary [name] is a 59 year-old female with history of supraventricular tachycardia, cerebral vascular accident, intermittent complete heart block, acute kidney injury, and thrombocytosis who presents for diarrhea.    1. DIARRHEA: Onset: at least 7 days ago Description: Initially having 6 or more loose watery stools daily. Purchased over-the-counter Imodium AD (1 tablet daily) several days ago and now having 2 or 3 stools daily, some watery and some soft, denies malodorous. Prior to having diarrhea had one bowel movement daily.  Symptoms Vomiting: denies Abdominal Pain: only when has to go to bathroom Urgency: yes Relief with defecation: yes  Weight loss: lost 3 pounds initially and now back to normal weight Lightheadedness: denies Sick contacts: denies Suspicious food or water: denies Change in diet: denies. Current diet consists of soups, potatoes, rice, beef, squash, turnip greens/mustard greens with smoked [place] wings. Appetite and drinking  are normal.  Red Flags Fever: denies Bloody stools: denies  Past Medical History:  Diagnosis Date  . Acute diastolic heart failure ([place]) 03/15/2013  . Anemia   . B12 deficiency   . Carotid stenosis    Carotid US 2/17: bilateral ICA 1-39% >> FU prn  . Childhood asthma   . Complete heart block ([place])   . Epigastric hernia 200's  . GERD (gastroesophageal reflux disease)   . Heart murmur   . History of blood transfusion    "14 w/1st pregnancy; 2 w/last C-section" (03/15/2013)  . Hx of echocardiogram    a. Echo 4/16:  Mild focal basal septal hypertrophy, EF 60-65%, no RWMA, Mechanical MVR ok with mild central regurgitation and no perivalvular leak, small mobile density attached to valvular ring (1.5x1 cm) - post surgical changes vs SBE, mild LAE;   b. Echo 2/17:  Mild post wall LVH, EF 55-60%, no RWMA, mechanical MVR ok (mean 5 mmHg), normal RVSF  . Hypertension    no pcp   will go to [facility]  . Macrocytic anemia   . 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 S/P mitral valve replacement with metallic valve 6/83/7290   59mm Sorin Carbomedics mechanical prosthesis via right mini thoracotomy approach  . 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 Severe mitral regurgitation 04/22/2013  . Sinus tachycardia   . SVT (supraventricular tachycardia) ([place]) 03/16/2013  . TIA (transient ischemic attack)   . Tricuspid regurgitation    Allergies  Allergen Reactions  . Oxycodone Itching    Current Outpatient Medications on File Prior to Visit  Medication Sig Dispense Refill  .  acetaminophen (TYLENOL) 500 MG tablet Take 1,000 mg by mouth every 6 (six) hours as needed for headache.    [name] aspirin EC 81 MG tablet Take 1 tablet (81 mg total) by mouth daily.    . folic acid (FOLVITE) 1 MG tablet Take 1 tablet (1 mg  total) by mouth daily. 90 tablet 0  . furosemide (LASIX) 40 MG tablet Take 1 tablet (40 mg total) by mouth daily. (Patient not taking: Reported on 06/26/2020) 30 tablet 3  . gabapentin (NEURONTIN) 100 MG capsule Take 1 capsule (100 mg total) by mouth 2 (two) times daily. And continue 300 mg at bedtime 180 capsule 3  . gabapentin (NEURONTIN) 300 MG capsule Take 1 capsule (300 mg total) by mouth at bedtime. As needed for nerve pain 90 capsule 0  . Hyprom-Naphaz-Polysorb-Zn Sulf (CLEAR EYES COMPLETE OP) Place 2 drops into both eyes daily as needed (red eyes).    . pantoprazole (PROTONIX) 40 MG tablet Take 1 tablet (40 mg total) by mouth daily. 90 tablet 0  . potassium chloride SA (KLOR-CON) 20 MEQ tablet Take 1 tablet (20 mEq total) by mouth daily. (Patient not taking: Reported on 06/26/2020) 30 tablet 3  . vitamin B-12 (CYANOCOBALAMIN) 1000 MCG tablet Take 1 tablet (1,000 mcg total) by mouth daily. 90 tablet 1  . Vitamin D, Ergocalciferol, (DRISDOL) 1.25 MG (50000 UNIT) CAPS capsule Take 1 capsule (50,000 Units total) by mouth every 7 (seven) days. 12 capsule 1  . warfarin (COUMADIN) 5 MG tablet Take 1.5 tablets Mon, Wed, Fri and 1 tablet all other days of the week or as directed by Coumadin clinic. (Patient taking differently: Take 5-7.5 mg by mouth See admin instructions. Take 7.5mg  tablet by mouth on  Mon, Wed, Fri., then take 5 mg tablet by mouth all other days of the week or as directed by Coumadin clinic.) 40 tablet 1   No current facility-administered medications on file prior to visit.    Observations/Objective: Alert and oriented x 3. Not in acute distress. Physical examination not completed as this is a telemedicine visit.  Assessment and Plan: 1. Diarrhea, unspecified type: - Patient with diarrhea for at least 7 days. Has improved over the last several days since beginning over-the-counter Imodium AD. Denies malodor and blood in stool. - Last colonoscopy 05/01/2020. - Encouraged oral  rehydration with water, sugar, salt solution such as Pedialyte or broth. - Encouraged BRAT diet (bananas, rice, applesauce, toast), boiled starches and cereals (such as potatoes, noodles, rice, wheat, and oat) with salt, crackers, bananas, soup, and boiled vegetables may also help with diarrhea. Avoid dairy and high fat content food. - Will complete stool culture to screen for any infectious organisms. - Follow-up with primary physician as needed. - Patient was given clear instructions to go to Emergency Department or return to medical center if symptoms don't improve, worsen, or new problems develop.The patient verbalized understanding. - Stool culture  Follow Up Instructions: Follow-up with primary physician as needed.    Patient was given clear instructions to go to Emergency Department or return to medical center if symptoms don't improve, worsen, or new problems develop.The patient verbalized understanding.  I discussed the assessment and treatment plan with the patient. The patient was provided an opportunity to ask questions and all were answered. The patient agreed with the plan and demonstrated an understanding of the instructions.   The patient was advised to call back or seek an in-person evaluation if the symptoms worsen or if the condition fails to  improve as anticipated.   I provided 11 minutes total of non-face-to-face time during this encounter including median intraservice time, reviewing previous notes, labs, imaging, medications, management and patient verbalized understanding.    Camillia Herter, NP  [facility] and [facility] [place], [place]   07/13/2020, 9:38 AM

## 2020-07-13 NOTE — Addendum Note (Signed)
Addended by: Camillia Herter on: 07/13/2020 04:22 PM   Modules accepted: Orders

## 2020-07-13 NOTE — Patient Instructions (Signed)

## 2020-07-17 ENCOUNTER — Ambulatory Visit: Payer: Medicaid Other | Admitting: Gastroenterology

## 2020-07-23 ENCOUNTER — Telehealth: Payer: Self-pay | Admitting: *Deleted

## 2020-07-23 NOTE — Telephone Encounter (Signed)
Pt is overdue to have INR checked. Called pt and LMOM for her to call the coumadin clinic back to schedule an appointment to have INR cheked.

## 2020-07-24 ENCOUNTER — Telehealth: Payer: Self-pay | Admitting: *Deleted

## 2020-07-24 NOTE — Telephone Encounter (Signed)
Called pt since she is overdue for her Anticoagulation Appt. Spoke with pt and set an appt for Tuesday, November 2nd at 945am.

## 2020-07-27 NOTE — Patient Instructions (Addendum)
Description   Skip today's dosage of Warfarin, then resume same dosage 1 tablet daily except for 1/2 tablet on Sundays and Wednesdays. Recheck INR in 2 weeks. Coumadin Clinic # [phone]   Please Bill Code [phone]

## 2020-07-28 ENCOUNTER — Ambulatory Visit (INDEPENDENT_AMBULATORY_CARE_PROVIDER_SITE_OTHER): Payer: Self-pay

## 2020-07-28 ENCOUNTER — Other Ambulatory Visit: Payer: Self-pay

## 2020-07-28 DIAGNOSIS — Z952 Presence of prosthetic heart valve: Secondary | ICD-10-CM

## 2020-07-28 DIAGNOSIS — Z7901 Long term (current) use of anticoagulants: Secondary | ICD-10-CM

## 2020-07-28 LAB — POCT INR: INR: 3.7 — AB (ref 2.0–3.0)

## 2020-07-28 MED ORDER — WARFARIN SODIUM 5 MG PO TABS: ORAL_TABLET | ORAL | 1 refills | Status: DC

## 2020-08-07 ENCOUNTER — Telehealth: Payer: Self-pay

## 2020-08-07 NOTE — Telephone Encounter (Signed)
Called patient and Left detailed message to call back to schedule an appointment with Dr. Havery Moros (IDA).

## 2020-08-07 NOTE — Telephone Encounter (Signed)
-----   Message from Yetta Flock, MD sent at 08/03/2020 10:14 PM EST ----- Regarding: RE: cbc result Thanks Jan. Is she scheduled for a follow up office visit with me? She warrants one, if you can help coordinate. Thanks ----- Message ----- From: Roetta Sessions, CMA Sent: 08/03/2020   1:03 PM EST To: Yetta Flock, MD Subject: cbc result                                     Lab called - Pt had CBC ordered by another provider on 10-1 but they see Herbert Seta put in an order about the same time for you  that never resulted.  Please see CBC from 10-1

## 2020-08-10 NOTE — Telephone Encounter (Signed)
LM for patient to call and schedule a f/u visit asap. Mailed letter to pt.

## 2020-08-12 ENCOUNTER — Telehealth: Payer: Self-pay | Admitting: *Deleted

## 2020-08-12 NOTE — Telephone Encounter (Signed)
Pt is overdue for Anticoagulation Appt; called and left her a message to call back directly to reschedule appt

## 2020-08-14 ENCOUNTER — Emergency Department (HOSPITAL_COMMUNITY): Payer: Medicaid Other

## 2020-08-14 ENCOUNTER — Encounter (HOSPITAL_COMMUNITY): Payer: Self-pay

## 2020-08-14 ENCOUNTER — Emergency Department (HOSPITAL_COMMUNITY)
Admission: EM | Admit: 2020-08-14 | Discharge: 2020-08-14 | Disposition: A | Discharge status: Home / Self Care | Payer: Medicaid Other | Attending: Emergency Medicine | Admitting: Emergency Medicine

## 2020-08-14 DIAGNOSIS — Z87891 Personal history of nicotine dependence: Secondary | ICD-10-CM | POA: Insufficient documentation

## 2020-08-14 DIAGNOSIS — S0003XA Contusion of scalp, initial encounter: Secondary | ICD-10-CM | POA: Diagnosis not present

## 2020-08-14 DIAGNOSIS — I110 Hypertensive heart disease with heart failure: Secondary | ICD-10-CM | POA: Diagnosis not present

## 2020-08-14 DIAGNOSIS — W19XXXA Unspecified fall, initial encounter: Secondary | ICD-10-CM

## 2020-08-14 DIAGNOSIS — Z7982 Long term (current) use of aspirin: Secondary | ICD-10-CM | POA: Insufficient documentation

## 2020-08-14 DIAGNOSIS — M25511 Pain in right shoulder: Secondary | ICD-10-CM | POA: Insufficient documentation

## 2020-08-14 DIAGNOSIS — Z7901 Long term (current) use of anticoagulants: Secondary | ICD-10-CM | POA: Insufficient documentation

## 2020-08-14 DIAGNOSIS — S0000XA Unspecified superficial injury of scalp, initial encounter: Secondary | ICD-10-CM | POA: Diagnosis present

## 2020-08-14 DIAGNOSIS — W010XXA Fall on same level from slipping, tripping and stumbling without subsequent striking against object, initial encounter: Secondary | ICD-10-CM | POA: Insufficient documentation

## 2020-08-14 DIAGNOSIS — I5021 Acute systolic (congestive) heart failure: Secondary | ICD-10-CM | POA: Diagnosis not present

## 2020-08-14 LAB — I-STAT CHEM 8, ED
BUN: 14 mg/dL (ref 6–20)
Calcium, Ion: 1.06 mmol/L — ABNORMAL LOW (ref 1.15–1.40)
Chloride: 103 mmol/L (ref 98–111)
Creatinine, Ser: 1.2 mg/dL — ABNORMAL HIGH (ref 0.44–1.00)
Glucose, Bld: 66 mg/dL — ABNORMAL LOW (ref 70–99)
HCT: 30 % — ABNORMAL LOW (ref 36.0–46.0)
Hemoglobin: 10.2 g/dL — ABNORMAL LOW (ref 12.0–15.0)
Potassium: 4.6 mmol/L (ref 3.5–5.1)
Sodium: 133 mmol/L — ABNORMAL LOW (ref 135–145)
TCO2: 21 mmol/L — ABNORMAL LOW (ref 22–32)

## 2020-08-14 MED ORDER — ACETAMINOPHEN 500 MG PO TABS
1000.0000 mg | ORAL_TABLET | Freq: Once | ORAL | Status: AC
  Administered 2020-08-14: 1000 mg via ORAL
  Filled 2020-08-14: qty 2

## 2020-08-14 NOTE — Discharge Instructions (Addendum)
You were seen in the ED after a fall  Imaging today of your head, neck, shoulder and upper back did not show any acute injuries  Expect some soreness and superficial bruising from today  Continue taking Tylenol every 6-8 hours for pain.  Ice areas that are tender.  Return to the ED for sudden onset severe headache, vision changes, any stroke symptoms or any other new concerns Follow-up with your primary care doctor in the next 7 to 10 days if pain is not improving

## 2020-08-14 NOTE — ED Notes (Signed)
Ptar called 

## 2020-08-14 NOTE — ED Triage Notes (Signed)
Patient bibems due to a fall. Patient was bending over and leaning on a stool when the stool fell. Pt fell and hit her head up against the door and her shoulder. Pt denies LOC. Pt is on coumadin. Pt c/o R head and shoulder pain. Pt alert and oriented x4. Pt denies any neck and back pain.

## 2020-08-14 NOTE — ED Provider Notes (Addendum)
[facility]ERGENCY DEPARTMENT Provider Note   CSN: [id number] Arrival date & time: 08/14/20  1833     History Chief Complaint  Patient presents with  . Fall    Mary [name] is a 59 y.o. female presents to ER for evaluation after a fall. Patient was standing on the ground leaning over a stool that tipped over. She fell to the ground hitting her head and right shoulder. Reports mild right occipital head pain and right shoulder pain. No LOC. Chronic poor vision but not acutely changed. No nausea, vomiting. No neck pain. No chest pain. No back or other extremity pain.  Patient reports loss of sensation from "neuropathy" all over her face, hands and legs, chronic.  Typically walks with a cane. Pmh includes neuropathy, anemia, MVR on warfarin,   HPI     Past Medical History:  Diagnosis Date  . Acute diastolic heart failure ([place]) 03/15/2013  . Anemia   . B12 deficiency   . Carotid stenosis    Carotid US 2/17: bilateral ICA 1-39% >> FU prn  . Childhood asthma   . Complete heart block ([place])   . Epigastric hernia 200's  . GERD (gastroesophageal reflux disease)   . Heart murmur   . History of blood transfusion    "14 w/1st pregnancy; 2 w/last C-section" (03/15/2013)  . Hx of echocardiogram    a. Echo 4/16:  Mild focal basal septal hypertrophy, EF 60-65%, no RWMA, Mechanical MVR ok with mild central regurgitation and no perivalvular leak, small mobile density attached to valvular ring (1.5x1 cm) - post surgical changes vs SBE, mild LAE;   b. Echo 2/17:  Mild post wall LVH, EF 55-60%, no RWMA, mechanical MVR ok (mean 5 mmHg), normal RVSF  . Hypertension    no pcp   will go to [facility]  . Macrocytic anemia   . 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 S/P mitral valve replacement with metallic valve 3/76/2831   69mm Sorin Carbomedics mechanical prosthesis via right mini thoracotomy approach  . 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   Complex valvuloplasty  including Cor-matrix ECM patch augmentation of anterior and lateral leaflets with 84mm Edwards mc3 ring annuloplasty via right mini-thoracotomy approach  . Severe mitral regurgitation 04/22/2013  . Sinus tachycardia   . SVT (supraventricular tachycardia) ([place]) 03/16/2013  . TIA (transient ischemic attack)   . Tricuspid regurgitation     Patient Active Problem List   Diagnosis Date Noted  . Folic acid deficiency 51/76/1607  . H/O mitral valve replacement   . GERD (gastroesophageal reflux disease)   . Lactic acid acidosis   . Marijuana abuse   . Macrocytic anemia 04/04/2019  . Intermittent complete heart block ([place])   . Syncope 03/28/2019  . Volume depletion 05/24/2018  . Acute kidney injury ([place]) 05/23/2018  . Orthostatic hypotension 01/11/2017  . Chronic hyponatremia 01/11/2017  . Elevated liver enzymes 01/11/2017  . Atypical chest pain 06/05/2016  . Palpitations 03/20/2016  . Sinus tachycardia 02/01/2016  . AKI (acute kidney injury) ([place]) 01/10/2016  . Subtherapeutic international normalized ratio (INR) 01/10/2016  . TIA (transient ischemic attack) 09/13/2015  . History of rheumatic heart disease 09/13/2015  . CVA (cerebral vascular accident) ([place]) 09/12/2015  . SOB (shortness of breath) 02/24/2014  . First degree heart block 06/17/2013  . Weakness 06/16/2013  . (HFpEF) heart failure with preserved ejection fraction ([place]) 06/16/2013  . Anemia 06/16/2013  . Thrombocytosis 06/16/2013  . History of bacterial endocarditis 06/16/2013  .  Heart valve replaced by other means 06/10/2013  . Warfarin anticoagulation 06/10/2013  . History of complete heart block 05/28/2013  . S/P mitral valve replacement with metallic valve 43/15/4008  . S/P minimally-invasive tricuspid valve repair 05/23/2013  . Femoral hernia 05/23/2013  . Mitral valve insufficiency 04/22/2013  . Mitral valve disorders(424.0) 04/22/2013  . Tricuspid regurgitation 04/22/2013  . Rheumatic mitral stenosis with  regurgitation 03/23/2013  . SVT (supraventricular tachycardia) ([place]) 03/16/2013    Past Surgical History:  Procedure Laterality Date  . APPENDECTOMY  [id number]  . BIOPSY  05/01/2020   Procedure: BIOPSY;  Surgeon: Milus Banister, MD;  Location: [facility];  Service: Endoscopy;;  . CARDIAC CATHETERIZATION    . CARDIAC VALVE REPLACEMENT    . CESAREAN SECTION  1983  . CESAREAN SECTION WITH BILATERAL TUBAL LIGATION  1999  . COLONOSCOPY WITH PROPOFOL N/A 05/01/2020   Procedure: COLONOSCOPY WITH PROPOFOL;  Surgeon: Milus Banister, MD;  Location: [facility] ENDOSCOPY;  Service: Endoscopy;  Laterality: N/A;  . ESOPHAGOGASTRODUODENOSCOPY (EGD) WITH PROPOFOL N/A 05/01/2020   Procedure: ESOPHAGOGASTRODUODENOSCOPY (EGD) WITH PROPOFOL;  Surgeon: Milus Banister, MD;  Location: [facility] ENDOSCOPY;  Service: Endoscopy;  Laterality: N/A;  . FEMORAL HERNIA REPAIR Right 05/23/2013   Procedure: HERNIA REPAIR FEMORAL;  Surgeon: Rexene Alberts, MD;  Location: [facility];  Service: Open Heart Surgery;  Laterality: Right;  . INTRAOPERATIVE TRANSESOPHAGEAL ECHOCARDIOGRAM N/A 05/23/2013   Procedure: INTRAOPERATIVE TRANSESOPHAGEAL ECHOCARDIOGRAM;  Surgeon: Rexene Alberts, MD;  Location: [facility];  Service: Open Heart Surgery;  Laterality: N/A;  . LAPAROSCOPIC CHOLECYSTECTOMY  2003  . LEFT AND RIGHT HEART CATHETERIZATION WITH CORONARY ANGIOGRAM N/A 03/22/2013   Procedure: LEFT AND RIGHT HEART CATHETERIZATION WITH CORONARY ANGIOGRAM;  Surgeon: Burnell Blanks, MD;  Location: [facility] CATH LAB;  Service: Cardiovascular;  Laterality: N/A;  . MINIMALLY INVASIVE TRICUSPID VALVE REPAIR Right 05/23/2013   Procedure: MINIMALLY INVASIVE TRICUSPID VALVE REPAIR;  Surgeon: Rexene Alberts, MD;  Location: [facility];  Service: Open Heart Surgery;  Laterality: Right;  . MITRAL VALVE REPLACEMENT N/A 05/23/2013   Procedure: MITRAL VALVE (MV) REPLACEMENT;  Surgeon: Rexene Alberts, MD;  Location: [facility];  Service: Open Heart Surgery;  Laterality: N/A;  . MULTIPLE  EXTRACTIONS WITH ALVEOLOPLASTY N/A 04/04/2013   Procedure: Extraction of tooth #'s 1,8,9,13,14,15,23,24,25,26 with alveoloplasty and gross debridement of remaining teeth;  Surgeon: Lenn Cal, DDS;  Location: [facility];  Service: Oral Surgery;  Laterality: N/A;  . TEE WITHOUT CARDIOVERSION N/A 03/18/2013   Procedure: TRANSESOPHAGEAL ECHOCARDIOGRAM (TEE);  Surgeon: Larey Dresser, MD;  Location: [facility];  Service: Cardiovascular;  Laterality: N/A;  . TEE WITHOUT CARDIOVERSION N/A 06/17/2013   Procedure: TRANSESOPHAGEAL ECHOCARDIOGRAM (TEE);  Surgeon: Lelon Perla, MD;  Location: [facility] ENDOSCOPY;  Service: Cardiovascular;  Laterality: N/A;  . TUBAL LIGATION  1999     OB History   No obstetric history on file.     Family History  Problem Relation Age of Onset  . Stroke Father   . Cancer Father   . Sarcoidosis Sister   . Heart attack Neg Hx   . Colon cancer Neg Hx   . Stomach cancer Neg Hx   . Pancreatic cancer Neg Hx   . Esophageal cancer Neg Hx     Social History   Tobacco Use  . Smoking status: Former Smoker    Packs/day: 0.50    Years: 36.00    Pack years: 18.00    Types: Cigarettes    Quit date: 03/15/2013  Years since quitting: 7.4  . Smokeless tobacco: Never Used  Vaping Use  . Vaping Use: Never used  Substance Use Topics  . Alcohol use: Yes    Comment:  "glass of wine"  . Drug use: No    Home Medications Prior to Admission medications   Medication Sig Start Date End Date Taking? Authorizing Provider  aspirin EC 81 MG tablet Take 1 tablet (81 mg total) by mouth daily. 08/26/13  Yes Larey Dresser, MD  folic acid (FOLVITE) 1 MG tablet Take 1 mg by mouth daily.   Yes [provider]  gabapentin (NEURONTIN) 300 MG capsule Take 1 capsule (300 mg total) by mouth at bedtime. As needed for nerve pain Patient taking differently: Take 300 mg by mouth in the morning, at noon, and at bedtime.  06/05/20  Yes Fulp, Cammie, MD  pantoprazole (PROTONIX) 40 MG  tablet Take 1 tablet (40 mg total) by mouth daily. 05/05/20 08/14/21 Yes [place] Donnamarie Poag, DO  Vitamin D, Ergocalciferol, (DRISDOL) 1.25 MG (50000 UNIT) CAPS capsule Take 1 capsule (50,000 Units total) by mouth every 7 (seven) days. Patient taking differently: Take 50,000 Units by mouth every Tuesday.  06/07/20  Yes Gildardo Pounds, NP  acetaminophen (TYLENOL) 500 MG tablet Take 1,000 mg by mouth every 6 (six) hours as needed for headache.    [provider]  furosemide (LASIX) 40 MG tablet Take 1 tablet (40 mg total) by mouth daily. 05/06/20   Jerline Pain, MD  gabapentin (NEURONTIN) 100 MG capsule Take 1 capsule (100 mg total) by mouth 2 (two) times daily. And continue 300 mg at bedtime Patient not taking: Reported on 08/14/2020 06/26/20   Fulp, Ander Gaster, MD  Hyprom-Naphaz-Polysorb-Zn Sulf (CLEAR EYES COMPLETE OP) Place 2 drops into both eyes daily as needed (red eyes).    [provider]  potassium chloride SA (KLOR-CON) 20 MEQ tablet Take 1 tablet (20 mEq total) by mouth daily. 05/06/20   Jerline Pain, MD  vitamin B-12 (CYANOCOBALAMIN) 1000 MCG tablet Take 1 tablet (1,000 mcg total) by mouth daily. 06/28/20   Fulp, Cammie, MD  warfarin (COUMADIN) 5 MG tablet Take 1 - 1/2 tablet daily or as directed by Coumadin clinic. 07/28/20   Evans Lance, MD    Allergies    Oxycodone and Tape  Review of Systems   Review of Systems  Musculoskeletal: Positive for arthralgias.  Neurological: Positive for headaches (scalp pain).  Hematological: Bruises/bleeds easily.  All other systems reviewed and are negative.   Physical Exam Updated Vital Signs BP (!) 133/91 (BP Location: Right Arm)   Pulse (!) 106   Temp 97.9 F (36.6 C) (Oral)   Resp 16   LMP 03/22/2013   SpO2 100%   Physical Exam Vitals and nursing note reviewed.  Constitutional:      General: She is not in acute distress.    Appearance: She is well-developed.     Comments: NAD.  HENT:     Head: Normocephalic and  atraumatic.     Comments: Right occipital scalp tenderness. No laceration.  No facial bone tenderness     Right Ear: External ear normal.     Left Ear: External ear normal.     Nose: Nose normal.  Eyes:     General: No scleral icterus.    Conjunctiva/sclera: Conjunctivae normal.  Neck:     Comments: No cervical collar in place. No midline or paraspinal neck tenderness. Full ROM of neck.  Cardiovascular:  Rate and Rhythm: Normal rate and regular rhythm.     Heart sounds: Normal heart sounds.  Pulmonary:     Effort: Pulmonary effort is normal.     Breath sounds: Normal breath sounds.     Comments: No chest wall tenderness Abdominal:     Palpations: Abdomen is soft.     Tenderness: There is no abdominal tenderness.  Musculoskeletal:        General: Tenderness present. No deformity. Normal range of motion.     Cervical back: Normal range of motion and neck supple.     Comments: Diffuse right lateral shoulder tenderness. Normal skin without wounds. Decreased abduction secondary to pain. No obvious deformity. No focal bony tenderness of right chest, clavicle, scapula  Mild right sided thoracic muscular tenderness. No midline tenderness of TL spine    Skin:    General: Skin is warm and dry.     Capillary Refill: Capillary refill takes less than 2 seconds.  Neurological:     Mental Status: She is alert and oriented to person, place, and time.     Comments:   Mental Status: Patient is awake, alert, oriented to person, place, year, and situation. Patient is able to give a clear and coherent history. Speech is fluent and clear without dysarthria or aphasia. No signs of neglect.  Cranial Nerves: I not tested II visual fields full bilaterally. PERRL.   III, IV, VI EOMs intact without ptosis V SUBJECTIVE DECREASED SENSATION TO LIGHT TOUCH in all 3 divisions of trigeminal nerve bilaterally (patient reports this is chronic) VII facial movements symmetric bilaterally VIII hearing intact  to voice/conversation  IX, X no uvula deviation, symmetric rise of soft palate/uvula XI 5/5 SCM and trapezius strength bilaterally  XII tongue protrusion midline, symmetric L/R movements  Motor:Billaterally weak but symmetric strength in upper/lower extremities (poor effort). SUBJECTIVE DECREASED SENSATION TO LIGHT TOUCH IN DISTAL UPPER/LOWER EXTREMITIES (patient reports this is chronic).  No pronator drift. No leg drop.  Cerebellar: No ataxia with finger to nose.   Psychiatric:        Behavior: Behavior normal.        Thought Content: Thought content normal.        Judgment: Judgment normal.     ED Results / Procedures / Treatments   Labs (all labs ordered are listed, but only abnormal results are displayed) Labs Reviewed - No data to display  EKG None  Radiology DG Thoracic Spine 2 View  Result Date: 08/14/2020 CLINICAL DATA:  Status post fall. EXAM: THORACIC SPINE 2 VIEWS COMPARISON:  None. FINDINGS: There is no evidence of thoracic spine fracture. Alignment is normal. No other significant bone abnormalities are identified. IMPRESSION: Negative. Electronically Signed   By: Virgina Norfolk M.D.   On: 08/14/2020 20:04   DG Shoulder Right  Result Date: 08/14/2020 CLINICAL DATA:  Status post fall. EXAM: RIGHT SHOULDER - 2+ VIEW COMPARISON:  None. FINDINGS: There is no evidence of fracture or dislocation. There is no evidence of arthropathy or other focal bone abnormality. Soft tissues are unremarkable. IMPRESSION: Negative. Electronically Signed   By: Virgina [place] M.D.   On: 08/14/2020 20:02   CT Head Wo Contrast  Result Date: 08/14/2020 CLINICAL DATA:  Status post fall. EXAM: CT HEAD WITHOUT CONTRAST TECHNIQUE: Contiguous axial images were obtained from the base of the skull through the vertex without intravenous contrast. COMPARISON:  March 27, 2019 FINDINGS: Brain: There is mild cerebral atrophy with widening of the extra-axial spaces and ventricular dilatation. There  are  areas of decreased attenuation within the white matter tracts of the supratentorial brain, consistent with microvascular disease changes. Vascular: No hyperdense vessel or unexpected calcification. Skull: Normal. Negative for fracture or focal lesion. Sinuses/Orbits: No acute finding. Other: None. IMPRESSION: 1. Mild cerebral atrophy. 2. No acute intracranial abnormality. Electronically Signed   By: Virgina Norfolk M.D.   On: 08/14/2020 20:08   CT Cervical Spine Wo Contrast  Result Date: 08/14/2020 CLINICAL DATA:  Status post fall. EXAM: CT CERVICAL SPINE WITHOUT CONTRAST TECHNIQUE: Multidetector CT imaging of the cervical spine was performed without intravenous contrast. Multiplanar CT image reconstructions were also generated. COMPARISON:  None. FINDINGS: Alignment: Normal. Skull base and vertebrae: No acute fracture. No primary bone lesion or focal pathologic process. Soft tissues and spinal canal: No prevertebral fluid or swelling. No visible canal hematoma. Disc levels: Normal multilevel endplates are seen with normal multilevel intervertebral disc spaces. Normal bilateral multilevel joints are noted. Upper chest: Negative. Other: None. IMPRESSION: No acute fracture or subluxation of the cervical spine. Electronically Signed   By: Virgina [place] M.D.   On: 08/14/2020 20:15    Procedures Procedures (including critical care time)  Medications Ordered in ED Medications  acetaminophen (TYLENOL) tablet 1,000 mg (has no administration in time range)    ED Course  I have reviewed the triage vital signs and the nursing notes.  Pertinent labs & imaging results that were available during my care of the patient were reviewed by me and considered in my medical decision making (see chart for details).    MDM Rules/Calculators/A&P                          59 year old female presents for evaluation after mechanical fall.  Reported trauma and right shoulder pain.  On Coumadin for MVR.   Imaging  ordered: CT head, cervical spine, thoracic spine x-ray, right shoulder x-ray.  Labs ordered: None.  Meds ordered: Tylenol  Imaging personally visualized and interpreted-no acute traumatic injuries.  Exam is benign.  She denies any other physical injuries.  No further emergent lab work or imaging indicated.  Will discharge with symptomatic management.  Return precautions discussed.  2040: Patient requesting lab work to check if she is "dehydrated". Chem 8 ordered.   2153: chem 8 at baseline from previous. Discussed with patient states "i'm ready to go" Discharged in good condition.  Final Clinical Impression(s) / ED Diagnoses Final diagnoses:  Fall, initial encounter  Contusion of scalp, initial encounter    Rx / DC Orders ED Discharge Orders    None         Arlean Hopping 08/14/20 2154    Drenda Freeze, MD 08/15/20 [phone]

## 2020-08-17 ENCOUNTER — Other Ambulatory Visit (HOSPITAL_COMMUNITY): Payer: Self-pay

## 2020-08-17 ENCOUNTER — Other Ambulatory Visit: Payer: Self-pay

## 2020-08-17 ENCOUNTER — Ambulatory Visit: Payer: Self-pay | Admitting: *Deleted

## 2020-08-17 ENCOUNTER — Inpatient Hospital Stay (HOSPITAL_COMMUNITY)
Admission: EM | Admit: 2020-08-17 | Discharge: 2020-08-24 | DRG: 091 | Disposition: A | Discharge status: Skilled Nursing Facility | Payer: Medicaid Other | Attending: Internal Medicine | Admitting: Internal Medicine

## 2020-08-17 DIAGNOSIS — Z952 Presence of prosthetic heart valve: Secondary | ICD-10-CM

## 2020-08-17 DIAGNOSIS — Z885 Allergy status to narcotic agent status: Secondary | ICD-10-CM

## 2020-08-17 DIAGNOSIS — Z6820 Body mass index (BMI) 20.0-20.9, adult: Secondary | ICD-10-CM

## 2020-08-17 DIAGNOSIS — R202 Paresthesia of skin: Secondary | ICD-10-CM

## 2020-08-17 DIAGNOSIS — G629 Polyneuropathy, unspecified: Secondary | ICD-10-CM | POA: Diagnosis present

## 2020-08-17 DIAGNOSIS — Z9119 Patient's noncompliance with other medical treatment and regimen: Secondary | ICD-10-CM

## 2020-08-17 DIAGNOSIS — R791 Abnormal coagulation profile: Secondary | ICD-10-CM

## 2020-08-17 DIAGNOSIS — E860 Dehydration: Secondary | ICD-10-CM | POA: Diagnosis present

## 2020-08-17 DIAGNOSIS — K709 Alcoholic liver disease, unspecified: Secondary | ICD-10-CM | POA: Diagnosis present

## 2020-08-17 DIAGNOSIS — G721 Alcoholic myopathy: Principal | ICD-10-CM | POA: Diagnosis present

## 2020-08-17 DIAGNOSIS — R892 Abnormal level of other drugs, medicaments and biological substances in specimens from other organs, systems and tissues: Secondary | ICD-10-CM

## 2020-08-17 DIAGNOSIS — Z809 Family history of malignant neoplasm, unspecified: Secondary | ICD-10-CM

## 2020-08-17 DIAGNOSIS — Z7901 Long term (current) use of anticoagulants: Secondary | ICD-10-CM

## 2020-08-17 DIAGNOSIS — Z823 Family history of stroke: Secondary | ICD-10-CM

## 2020-08-17 DIAGNOSIS — I5032 Chronic diastolic (congestive) heart failure: Secondary | ICD-10-CM | POA: Diagnosis present

## 2020-08-17 DIAGNOSIS — Z888 Allergy status to other drugs, medicaments and biological substances status: Secondary | ICD-10-CM

## 2020-08-17 DIAGNOSIS — Z20822 Contact with and (suspected) exposure to covid-19: Secondary | ICD-10-CM | POA: Diagnosis present

## 2020-08-17 DIAGNOSIS — Z8673 Personal history of transient ischemic attack (TIA), and cerebral infarction without residual deficits: Secondary | ICD-10-CM

## 2020-08-17 DIAGNOSIS — K219 Gastro-esophageal reflux disease without esophagitis: Secondary | ICD-10-CM | POA: Diagnosis present

## 2020-08-17 DIAGNOSIS — I5030 Unspecified diastolic (congestive) heart failure: Secondary | ICD-10-CM | POA: Diagnosis present

## 2020-08-17 DIAGNOSIS — N179 Acute kidney failure, unspecified: Secondary | ICD-10-CM | POA: Diagnosis present

## 2020-08-17 DIAGNOSIS — D539 Nutritional anemia, unspecified: Secondary | ICD-10-CM | POA: Diagnosis present

## 2020-08-17 DIAGNOSIS — I110 Hypertensive heart disease with heart failure: Secondary | ICD-10-CM | POA: Diagnosis present

## 2020-08-17 DIAGNOSIS — R748 Abnormal levels of other serum enzymes: Secondary | ICD-10-CM | POA: Diagnosis present

## 2020-08-17 DIAGNOSIS — Z7982 Long term (current) use of aspirin: Secondary | ICD-10-CM

## 2020-08-17 DIAGNOSIS — G9340 Encephalopathy, unspecified: Secondary | ICD-10-CM

## 2020-08-17 DIAGNOSIS — I442 Atrioventricular block, complete: Secondary | ICD-10-CM | POA: Diagnosis present

## 2020-08-17 DIAGNOSIS — Z79899 Other long term (current) drug therapy: Secondary | ICD-10-CM

## 2020-08-17 DIAGNOSIS — R200 Anesthesia of skin: Secondary | ICD-10-CM

## 2020-08-17 DIAGNOSIS — R531 Weakness: Secondary | ICD-10-CM

## 2020-08-17 DIAGNOSIS — E43 Unspecified severe protein-calorie malnutrition: Secondary | ICD-10-CM | POA: Diagnosis present

## 2020-08-17 DIAGNOSIS — F1020 Alcohol dependence, uncomplicated: Secondary | ICD-10-CM | POA: Diagnosis present

## 2020-08-17 DIAGNOSIS — Z87891 Personal history of nicotine dependence: Secondary | ICD-10-CM

## 2020-08-17 DIAGNOSIS — R269 Unspecified abnormalities of gait and mobility: Secondary | ICD-10-CM | POA: Diagnosis present

## 2020-08-17 LAB — COMPREHENSIVE METABOLIC PANEL
ALT: 47 U/L — ABNORMAL HIGH (ref 0–44)
AST: 92 U/L — ABNORMAL HIGH (ref 15–41)
Albumin: 3 g/dL — ABNORMAL LOW (ref 3.5–5.0)
Alkaline Phosphatase: 114 U/L (ref 38–126)
Anion gap: 14 (ref 5–15)
BUN: 16 mg/dL (ref 6–20)
CO2: 16 mmol/L — ABNORMAL LOW (ref 22–32)
Calcium: 8.7 mg/dL — ABNORMAL LOW (ref 8.9–10.3)
Chloride: 104 mmol/L (ref 98–111)
Creatinine, Ser: 1.37 mg/dL — ABNORMAL HIGH (ref 0.44–1.00)
GFR, Estimated: 45 mL/min — ABNORMAL LOW (ref >60)
Glucose, Bld: 89 mg/dL (ref 70–99)
Potassium: 5 mmol/L (ref 3.5–5.1)
Sodium: 134 mmol/L — ABNORMAL LOW (ref 135–145)
Total Bilirubin: 1.3 mg/dL — ABNORMAL HIGH (ref 0.3–1.2)
Total Protein: 5.8 g/dL — ABNORMAL LOW (ref 6.5–8.1)

## 2020-08-17 LAB — CBC WITH DIFFERENTIAL/PLATELET
Abs Immature Granulocytes: 0.02 10*3/uL (ref 0.00–0.07)
Basophils Absolute: 0 10*3/uL (ref 0.0–0.1)
Basophils Relative: 0 %
Eosinophils Absolute: 0.1 10*3/uL (ref 0.0–0.5)
Eosinophils Relative: 1 %
HCT: 27 % — ABNORMAL LOW (ref 36.0–46.0)
Hemoglobin: 9.4 g/dL — ABNORMAL LOW (ref 12.0–15.0)
Immature Granulocytes: 0 %
Lymphocytes Relative: 25 %
Lymphs Abs: 1.3 10*3/uL (ref 0.7–4.0)
MCH: 44.1 pg — ABNORMAL HIGH (ref 26.0–34.0)
MCHC: 34.8 g/dL (ref 30.0–36.0)
MCV: 126.8 fL — ABNORMAL HIGH (ref 80.0–100.0)
Monocytes Absolute: 0.3 10*3/uL (ref 0.1–1.0)
Monocytes Relative: 5 %
Neutro Abs: 3.7 10*3/uL (ref 1.7–7.7)
Neutrophils Relative %: 69 %
Platelets: 368 10*3/uL (ref 150–400)
RBC: 2.13 MIL/uL — ABNORMAL LOW (ref 3.87–5.11)
RDW: 16 % — ABNORMAL HIGH (ref 11.5–15.5)
WBC: 5.4 10*3/uL (ref 4.0–10.5)
nRBC: 0.4 % — ABNORMAL HIGH (ref 0.0–0.2)

## 2020-08-17 MED ORDER — LACTATED RINGERS IV BOLUS
1000.0000 mL | Freq: Once | INTRAVENOUS | Status: AC
  Administered 2020-08-18: 1000 mL via INTRAVENOUS

## 2020-08-17 MED ORDER — LORAZEPAM 1 MG PO TABS
1.0000 mg | ORAL_TABLET | ORAL | Status: DC | PRN
  Administered 2020-08-20 – 2020-08-23 (×6): 1 mg via ORAL
  Filled 2020-08-17 (×8): qty 1

## 2020-08-17 MED ORDER — LORAZEPAM 2 MG/ML IJ SOLN: 1.0000 mg | Freq: Four times a day (QID) | INTRAMUSCULAR | Status: DC | PRN

## 2020-08-17 NOTE — ED Triage Notes (Signed)
Patient arrived via EMS from home with multiple complaints. Numbness, weakness  and vision change x 7 days. Concerned for increased dosing of gabapentin. Patient stated she is suppose to take it twice a day, but she's been taking more to get relief from her neuropathy. Patient c/o numbness and pain in mouth d/t to "grinding her teeth". EMS endorsed CBD use,

## 2020-08-17 NOTE — Telephone Encounter (Signed)
C/o neuropathy in hands, feet, and now mouth and lips. Patient reports she felt increase numbness in her chin, then her lips, and nose and eyes since last week. Patient took extra doses of gabapentin 100mg  than she was prescribed thinking it would help her numbness in her face decrease. Reports increase in blurred vision , slurred speech and left side weakness since last week. Reports face is numb and tingling. Reports drooling excessive from mouth, constant tearing from eyes, nose running, and she can not feel when she combs her hair. Patient was attempting to see PCP. NT requested to patient to allow to call 911. Patient agreeable if she could call her mother in [place], due to patient's mother attempting to call while talking to this NT. Contacted 911 and patient able to unlock door to allow Fire Department in her house. Patient is being assessed by EMS at this time.   Reason for Disposition  [1] Numbness (i.e., loss of sensation) of the face, arm / hand, or leg / foot on one side of the body AND [2] sudden onset AND [3] present now  Answer Assessment - Initial Assessment Questions 1. SYMPTOM: "What is the main symptom you are concerned about?" (e.g., weakness, numbness)     Increasing numbness in face, mouth, chin, lips and can not feel hair when combing it  2. ONSET: "When did this start?" (minutes, hours, days; while sleeping)     Last week but worse now 3. LAST NORMAL: "When was the last time you were normal (no symptoms)?"     Last week  4. PATTERN "Does this come and go, or has it been constant since it started?"  "Is it present now?"     constant 5. CARDIAC SYMPTOMS: "Have you had any of the following symptoms: chest pain, difficulty breathing, palpitations?"     No  6. NEUROLOGIC SYMPTOMS: "Have you had any of the following symptoms: headache, dizziness, vision loss, double vision, changes in speech, unsteady on your feet?"     Increasing blurred vision, speech slurred recent fall 11/19 7.  OTHER SYMPTOMS: "Do you have any other symptoms?"     Increase drooling from mouth, tears from eyes, nose constantly running, patient took more than prescribed dose of neurontin 8. PREGNANCY: "Is there any chance you are pregnant?" "When was your last menstrual period?"     na  Protocols used: NEUROLOGIC DEFICIT-A-AH

## 2020-08-18 ENCOUNTER — Emergency Department (HOSPITAL_COMMUNITY): Payer: Medicaid Other

## 2020-08-18 DIAGNOSIS — Z79899 Other long term (current) drug therapy: Secondary | ICD-10-CM | POA: Diagnosis not present

## 2020-08-18 DIAGNOSIS — Z809 Family history of malignant neoplasm, unspecified: Secondary | ICD-10-CM | POA: Diagnosis not present

## 2020-08-18 DIAGNOSIS — Z7982 Long term (current) use of aspirin: Secondary | ICD-10-CM | POA: Diagnosis not present

## 2020-08-18 DIAGNOSIS — R791 Abnormal coagulation profile: Secondary | ICD-10-CM

## 2020-08-18 DIAGNOSIS — Z87891 Personal history of nicotine dependence: Secondary | ICD-10-CM | POA: Diagnosis not present

## 2020-08-18 DIAGNOSIS — Z952 Presence of prosthetic heart valve: Secondary | ICD-10-CM | POA: Diagnosis not present

## 2020-08-18 DIAGNOSIS — G629 Polyneuropathy, unspecified: Secondary | ICD-10-CM | POA: Diagnosis present

## 2020-08-18 DIAGNOSIS — E43 Unspecified severe protein-calorie malnutrition: Secondary | ICD-10-CM | POA: Diagnosis present

## 2020-08-18 DIAGNOSIS — E860 Dehydration: Secondary | ICD-10-CM | POA: Diagnosis present

## 2020-08-18 DIAGNOSIS — Z823 Family history of stroke: Secondary | ICD-10-CM | POA: Diagnosis not present

## 2020-08-18 DIAGNOSIS — I110 Hypertensive heart disease with heart failure: Secondary | ICD-10-CM | POA: Diagnosis present

## 2020-08-18 DIAGNOSIS — N179 Acute kidney failure, unspecified: Secondary | ICD-10-CM

## 2020-08-18 DIAGNOSIS — Z9119 Patient's noncompliance with other medical treatment and regimen: Secondary | ICD-10-CM | POA: Diagnosis not present

## 2020-08-18 DIAGNOSIS — R200 Anesthesia of skin: Secondary | ICD-10-CM | POA: Diagnosis present

## 2020-08-18 DIAGNOSIS — R748 Abnormal levels of other serum enzymes: Secondary | ICD-10-CM

## 2020-08-18 DIAGNOSIS — Z885 Allergy status to narcotic agent status: Secondary | ICD-10-CM | POA: Diagnosis not present

## 2020-08-18 DIAGNOSIS — K709 Alcoholic liver disease, unspecified: Secondary | ICD-10-CM | POA: Diagnosis present

## 2020-08-18 DIAGNOSIS — I5032 Chronic diastolic (congestive) heart failure: Secondary | ICD-10-CM | POA: Diagnosis present

## 2020-08-18 DIAGNOSIS — I442 Atrioventricular block, complete: Secondary | ICD-10-CM | POA: Diagnosis present

## 2020-08-18 DIAGNOSIS — D539 Nutritional anemia, unspecified: Secondary | ICD-10-CM | POA: Diagnosis present

## 2020-08-18 DIAGNOSIS — K219 Gastro-esophageal reflux disease without esophagitis: Secondary | ICD-10-CM | POA: Diagnosis present

## 2020-08-18 DIAGNOSIS — Z20822 Contact with and (suspected) exposure to covid-19: Secondary | ICD-10-CM | POA: Diagnosis present

## 2020-08-18 DIAGNOSIS — G721 Alcoholic myopathy: Secondary | ICD-10-CM | POA: Diagnosis present

## 2020-08-18 DIAGNOSIS — R269 Unspecified abnormalities of gait and mobility: Secondary | ICD-10-CM | POA: Diagnosis present

## 2020-08-18 DIAGNOSIS — I5031 Acute diastolic (congestive) heart failure: Secondary | ICD-10-CM

## 2020-08-18 DIAGNOSIS — Z6820 Body mass index (BMI) 20.0-20.9, adult: Secondary | ICD-10-CM | POA: Diagnosis not present

## 2020-08-18 DIAGNOSIS — F1020 Alcohol dependence, uncomplicated: Secondary | ICD-10-CM | POA: Diagnosis present

## 2020-08-18 DIAGNOSIS — Z888 Allergy status to other drugs, medicaments and biological substances status: Secondary | ICD-10-CM | POA: Diagnosis not present

## 2020-08-18 DIAGNOSIS — R202 Paresthesia of skin: Secondary | ICD-10-CM

## 2020-08-18 LAB — BASIC METABOLIC PANEL
Anion gap: 11 (ref 5–15)
BUN: 13 mg/dL (ref 6–20)
CO2: 21 mmol/L — ABNORMAL LOW (ref 22–32)
Calcium: 8.6 mg/dL — ABNORMAL LOW (ref 8.9–10.3)
Chloride: 105 mmol/L (ref 98–111)
Creatinine, Ser: 1.21 mg/dL — ABNORMAL HIGH (ref 0.44–1.00)
GFR, Estimated: 52 mL/min — ABNORMAL LOW (ref >60)
Glucose, Bld: 93 mg/dL (ref 70–99)
Potassium: 3.5 mmol/L (ref 3.5–5.1)
Sodium: 137 mmol/L (ref 135–145)

## 2020-08-18 LAB — I-STAT ARTERIAL BLOOD GAS, ED
Acid-base deficit: 5 mmol/L — ABNORMAL HIGH (ref 0.0–2.0)
Bicarbonate: 17.1 mmol/L — ABNORMAL LOW (ref 20.0–28.0)
Calcium, Ion: 1.18 mmol/L (ref 1.15–1.40)
HCT: 27 % — ABNORMAL LOW (ref 36.0–46.0)
Hemoglobin: 9.2 g/dL — ABNORMAL LOW (ref 12.0–15.0)
O2 Saturation: 99 %
Patient temperature: 98.6
Potassium: 3.9 mmol/L (ref 3.5–5.1)
Sodium: 135 mmol/L (ref 135–145)
TCO2: 18 mmol/L — ABNORMAL LOW (ref 22–32)
pCO2 arterial: 22.4 mmHg — ABNORMAL LOW (ref 32.0–48.0)
pH, Arterial: 7.489 — ABNORMAL HIGH (ref 7.350–7.450)
pO2, Arterial: 106 mmHg (ref 83.0–108.0)

## 2020-08-18 LAB — HEMOGLOBIN A1C
Hgb A1c MFr Bld: 4.2 % — ABNORMAL LOW (ref 4.8–5.6)
Mean Plasma Glucose: 73.84 mg/dL

## 2020-08-18 LAB — CK: Total CK: 39 U/L (ref 38–234)

## 2020-08-18 LAB — PROTIME-INR
INR: >10 (ref 0.8–1.2)
INR: 1.4 — ABNORMAL HIGH (ref 0.8–1.2)
Prothrombin Time: >90 seconds — ABNORMAL HIGH (ref 11.4–15.2)
Prothrombin Time: 16.4 seconds — ABNORMAL HIGH (ref 11.4–15.2)

## 2020-08-18 LAB — FOLATE: Folate: 33.8 ng/mL (ref >5.9)

## 2020-08-18 LAB — AMMONIA: Ammonia: 49 umol/L — ABNORMAL HIGH (ref 9–35)

## 2020-08-18 LAB — RESPIRATORY PANEL BY RT PCR (FLU A&B, COVID)
Influenza A by PCR: NEGATIVE
Influenza B by PCR: NEGATIVE
SARS Coronavirus 2 by RT PCR: NEGATIVE

## 2020-08-18 LAB — BRAIN NATRIURETIC PEPTIDE: B Natriuretic Peptide: 95.8 pg/mL (ref 0.0–100.0)

## 2020-08-18 LAB — SEDIMENTATION RATE: Sed Rate: 14 mm/hr (ref 0–22)

## 2020-08-18 LAB — VITAMIN B12: Vitamin B-12: 420 pg/mL (ref 180–914)

## 2020-08-18 LAB — ETHANOL: Alcohol, Ethyl (B): <10 mg/dL (ref <10)

## 2020-08-18 LAB — C-REACTIVE PROTEIN: CRP: <0.5 mg/dL (ref <1.0)

## 2020-08-18 MED ORDER — VITAMIN K1 10 MG/ML IJ SOLN
10.0000 mg | Freq: Once | INTRAVENOUS | Status: DC
  Filled 2020-08-18: qty 1

## 2020-08-18 MED ORDER — ACETAMINOPHEN 325 MG PO TABS
650.0000 mg | ORAL_TABLET | Freq: Four times a day (QID) | ORAL | Status: DC | PRN
  Administered 2020-08-18 – 2020-08-23 (×8): 650 mg via ORAL
  Filled 2020-08-18 (×9): qty 2

## 2020-08-18 MED ORDER — HEPARIN (PORCINE) 25000 UT/250ML-% IV SOLN: 800.0000 [IU]/h | INTRAVENOUS | Status: DC

## 2020-08-18 MED ORDER — POTASSIUM CHLORIDE CRYS ER 20 MEQ PO TBCR
20.0000 meq | EXTENDED_RELEASE_TABLET | Freq: Every day | ORAL | Status: DC
  Administered 2020-08-18 – 2020-08-22 (×3): 20 meq via ORAL
  Filled 2020-08-18 (×5): qty 1

## 2020-08-18 MED ORDER — FUROSEMIDE 40 MG PO TABS
40.0000 mg | ORAL_TABLET | Freq: Every day | ORAL | Status: DC
  Administered 2020-08-18: 40 mg via ORAL
  Filled 2020-08-18: qty 1
  Filled 2020-08-18: qty 2

## 2020-08-18 MED ORDER — GABAPENTIN 300 MG PO CAPS
300.0000 mg | ORAL_CAPSULE | Freq: Every day | ORAL | Status: DC
  Administered 2020-08-18 – 2020-08-23 (×6): 300 mg via ORAL
  Filled 2020-08-18 (×6): qty 1

## 2020-08-18 MED ORDER — SODIUM CHLORIDE 0.45 % IV SOLN: INTRAVENOUS | Status: DC

## 2020-08-18 MED ORDER — ACETAMINOPHEN 500 MG PO TABS: 1000.0000 mg | ORAL_TABLET | Freq: Four times a day (QID) | ORAL | Status: DC | PRN

## 2020-08-18 MED ORDER — MAGIC MOUTHWASH W/LIDOCAINE
10.0000 mL | Freq: Four times a day (QID) | ORAL | Status: DC
  Administered 2020-08-18 – 2020-08-24 (×18): 10 mL via ORAL
  Filled 2020-08-18 (×29): qty 10

## 2020-08-18 MED ORDER — VITAMIN K1 10 MG/ML IJ SOLN
10.0000 mg | Freq: Once | INTRAVENOUS | Status: AC
  Administered 2020-08-18: 10 mg via INTRAVENOUS
  Filled 2020-08-18 (×2): qty 1

## 2020-08-18 MED ORDER — PANTOPRAZOLE SODIUM 40 MG PO TBEC
40.0000 mg | DELAYED_RELEASE_TABLET | Freq: Every day | ORAL | Status: DC
  Administered 2020-08-18 – 2020-08-24 (×7): 40 mg via ORAL
  Filled 2020-08-18 (×8): qty 1

## 2020-08-18 MED ORDER — ASPIRIN EC 81 MG PO TBEC
81.0000 mg | DELAYED_RELEASE_TABLET | Freq: Every day | ORAL | Status: DC
  Administered 2020-08-18 – 2020-08-22 (×5): 81 mg via ORAL
  Filled 2020-08-18 (×5): qty 1

## 2020-08-18 MED ORDER — ACETAMINOPHEN 650 MG RE SUPP: 650.0000 mg | Freq: Four times a day (QID) | RECTAL | Status: DC | PRN

## 2020-08-18 MED ORDER — GABAPENTIN 100 MG PO CAPS
100.0000 mg | ORAL_CAPSULE | Freq: Two times a day (BID) | ORAL | Status: DC
  Administered 2020-08-18 – 2020-08-24 (×13): 100 mg via ORAL
  Filled 2020-08-18 (×13): qty 1

## 2020-08-18 MED ORDER — LACTATED RINGERS IV BOLUS
1000.0000 mL | Freq: Once | INTRAVENOUS | Status: AC
  Administered 2020-08-18: 1000 mL via INTRAVENOUS

## 2020-08-18 NOTE — ED Notes (Signed)
Lunch Tray Ordered @ 1003. 

## 2020-08-18 NOTE — ED Notes (Signed)
Pt states she can not see the visual acuity board at all. She states her vision is blurry.

## 2020-08-18 NOTE — Progress Notes (Addendum)
ANTICOAGULATION CONSULT NOTE - Initial Consult  Pharmacy Consult for Heparin (when INR is <2.5) Indication: Mechanical MVR  Allergies  Allergen Reactions  . Oxycodone Itching  . Tape Rash and Other (See Comments)    THE ONLY TAPE THAT IS TOLERATED IS PAPER TAPE. EKG leads inflame the skin    Patient Measurements: Height: 5\' 2"  (157.5 cm) Weight: 51.3 kg (113 lb 1.5 oz) IBW/kg (Calculated) : 50.1  Vital Signs: Temp: 98.1 F (36.7 C) (11/23 2134) Temp Source: Oral (11/23 2134) BP: 124/75 (11/23 2134) Pulse Rate: 103 (11/23 2134)  Labs: Recent Labs    08/17/20 1605 08/18/20 0004 08/18/20 0131 08/18/20 0810 08/18/20 1635 08/18/20 2152  HGB 9.4* 9.2*  --   --   --   --   HCT 27.0* 27.0*  --   --   --   --   PLT 368  --   --   --   --   --   LABPROT  --   --  >90.0*  --   --  16.4*  INR  --   --  >10.0*  --   --  1.4*  CREATININE 1.37*  --   --  1.21*  --   --   CKTOTAL  --   --   --   --  39  --     Estimated Creatinine Clearance: 40.6 mL/min (A) (by C-G formula based on SCr of 1.21 mg/dL (H)).   Medical History: Past Medical History:  Diagnosis Date  . Acute diastolic heart failure ([place]) 03/15/2013  . Anemia   . B12 deficiency   . Carotid stenosis    Carotid US 2/17: bilateral ICA 1-39% >> FU prn  . Childhood asthma   . Complete heart block ([place])   . Epigastric hernia 200's  . GERD (gastroesophageal reflux disease)   . Heart murmur   . History of blood transfusion    "14 w/1st pregnancy; 2 w/last C-section" (03/15/2013)  . Hx of echocardiogram    a. Echo 4/16:  Mild focal basal septal hypertrophy, EF 60-65%, no RWMA, Mechanical MVR ok with mild central regurgitation and no perivalvular leak, small mobile density attached to valvular ring (1.5x1 cm) - post surgical changes vs SBE, mild LAE;   b. Echo 2/17:  Mild post wall LVH, EF 55-60%, no RWMA, mechanical MVR ok (mean 5 mmHg), normal RVSF  . Hypertension    no pcp   will go to [facility]  . Macrocytic anemia   .  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 S/P mitral valve replacement with metallic valve 2/42/3536   60mm Sorin Carbomedics mechanical prosthesis via right mini thoracotomy approach  . 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   Complex valvuloplasty including Cor-matrix ECM patch augmentation of anterior and lateral leaflets with 79mm Edwards mc3 ring annuloplasty via right mini-thoracotomy approach  . Severe mitral regurgitation 04/22/2013  . Sinus tachycardia   . SVT (supraventricular tachycardia) ([place]) 03/16/2013  . TIA (transient ischemic attack)   . Tricuspid regurgitation    Assessment: Consulted to begin heparin in a 59 y/o F whose warfarin was held due to INR of >10 and reversed with Vit K. INR is now 1.4 tonight, will begin heparin. Initially thought to be having GIB, but GI has evaluated pt and states no GI issues currently and they have signed off.   Goal of Therapy:  Heparin level 0.3-0.5 units/ml Monitor platelets by anticoagulation protocol: Yes   Plan:  Start heparin drip  at 850 units/hr  0800 heparin level  Daily CBC/HL Monitor for bleeding Should be able to resume warfarin tomorrow   Narda Bonds, PharmD, BCPS Clinical Pharmacist Phone: [phone]  ============================================= Addendum 08/19/2020 1:36 AM RN called, pt refusing heparin. I discussed with the patient the importance of the heparin while her INR is low-pt continues to refuse. RN states she is making MD aware.  Narda Bonds, PharmD, BCPS Clinical Pharmacist Phone: [phone]

## 2020-08-18 NOTE — Progress Notes (Signed)
PROGRESS NOTE    Mary [name]  TKW:[id number] DOB: August 09, 1961 DOA: 08/17/2020  PCP: Antony Blackbird, MD    Brief Narrative:  HPI: Mary [name] is a 59 y.o. female with medical history significant of multiple medical problems listed below. She has a peripheral neuropathy for which she was prescribed neurontin - 100 mg bid and 300 mg at bedtime. She reports that due to paresthesia affecting lips and mouth she hasn't been eating or drinking fluids, living on ensure, pudding and jello. Over the past week, or so, she has increased her dose up to 1200 mg daily. With the increased dose she has noticed problems with weakness, drooling, dizziness. Due to these symptoms she presents [facility] for evaluation. She has not had chest pain, focal weakness, visual changes, respiratory symtoms, no N/V, no Diarrhea   ED Course: BP 139/82  HR 112  RR 22. EDP exam revealed symmetrical BLE tenderness. Lab significant for K =- 5.0, which on repeat was 3.9. Cr 1.37 ( 0.82 07/03/20, 1.20 08/14/20) Alb 3.0, AST 92, ALT 47, NH3 49, CBC nl, INR >10. MRI brain w/o acute findings but several punctate hemorrhages noted. TRH called to admit for rehydration and monitoring of sympotms. Per EDP neuro was consulted and in light of MRI w/o acute changes agreed with rehydration - if symptoms persist to request formal neuro consult.    Assessment & Plan: Active Problems:   (HFpEF) heart failure with preserved ejection fraction (HCC)   Elevated liver enzymes   Acute kidney injury ([place])   GERD (gastroesophageal reflux disease)   AKI- prerenal azotemia. Patient admits poor PO intake. Plan     IVF             F/u Bmet  Supratherapeutic INR.: Pt is on coumadin for her valve.  No obvious bleeding. We weill start vit k and gi consult and neurology consult.  Repeat inr is pending. D/w pt about alcohol cessation and risk of bleeding.   Paresthesia: patient with progressive paresthesia with a non-focal neuro exam.  Previous recent  B12 levels normal, nl thyroid function. MRI brain w/o acute changes except for scattered punctate hemorrhages.neurology consulted  Heart failure - no sign decompensation. Continue home regimen. Stable.   GERD continue PPI     DVT prophylaxis: currently hyper-coagulated with INR >10  Code Status: full code  Family Communication: did not call elderly mother due to the hour  Disposition Plan: home when stable  Consults called:Neurology: Dr. Dolores Lory                              GI; Dr. Fransico Him  Admission status: Inpatient.    Subjective/Overview: PT seen today and chart review showed elevated inr and pt immediately given vit k. Chart review shows pt having rectal bleeding off and on as she continued to drink. D/w pt that she needs to stop drinking alcohol and cut down . Pt verbalized understanding.    Objective: Vitals:   08/18/20 0715 08/18/20 0729 08/18/20 1202 08/18/20 1334  BP: (!) 112/96 (!) 112/96 136/90 (!) 149/80  Pulse: (!) 109 100 (!) 101 (!) 106  Resp: (!) 28 17 17 18   Temp:  97.8 F (36.6 C) 97.7 F (36.5 C) (!) 97.5 F (36.4 C)  TempSrc:  Oral Oral Oral  SpO2: 100% 100% 100% 91%  Height:       SpO2: 91 % No intake or output data in the 24 hours  ending 08/18/20 1359 There were no vitals filed for this visit.  Examination: Blood pressure (!) 149/80, pulse (!) 106, temperature (!) 97.5 F (36.4 C), temperature source Oral, resp. rate 18, height 5\' 2"  (2.449 m), last menstrual period 03/22/2013, SpO2 91 %. General exam: Appears calm and comfortable  HEENT:EOMI, perrl  Respiratory system: Clear to auscultation. Respiratory effort normal. Cardiovascular system: S1 & S2 heard, RRR. No JVD, murmurs, rubs, gallops or clicks. No pedal edema. Gastrointestinal system: Abdomen is nondistended, soft and nontender. No organomegaly or masses felt. Normal bowel sounds heard. Central nervous system: Alert and oriented.CN grossly intact No focal  neurological deficits. Extremities: pt moving all 4 ext and ambulating. Skin: No rashes, lesions or ulcers Psychiatry: Judgement and insight appear normal. Mood & affect appropriate.   Data Reviewed: I have personally reviewed following labs and imaging studies  Labs  No results for input(s): CKTOTAL, CKMB, TROPONINI in the last 72 hours. Lab Results  Component Value Date   WBC 5.4 08/17/2020   HGB 9.2 (L) 08/18/2020   HCT 27.0 (L) 08/18/2020   MCV 126.8 (H) 08/17/2020   PLT 368 08/17/2020    Recent Labs  Lab 08/17/20 1605 08/18/20 0004 08/18/20 0810  NA 134*   < > 137  K 5.0   < > 3.5  CL 104  --  105  CO2 16*  --  21*  BUN 16  --  13  CREATININE 1.37*  --  1.21*  CALCIUM 8.7*  --  8.6*  PROT 5.8*  --   --   BILITOT 1.3*  --   --   ALKPHOS 114  --   --   ALT 47*  --   --   AST 92*  --   --   GLUCOSE 89  --  93   < > = values in this interval not displayed.   Lab Results  Component Value Date   CHOL 189 05/31/2018   HDL 68 05/31/2018   LDLCALC 61 05/31/2018   TRIG 300 (H) 05/31/2018   Lab Results  Component Value Date   DDIMER 0.55 (H) 12/19/2016   Invalid input(s): POCBNP   Radiology Studies: MR BRAIN WO CONTRAST  Result Date: 08/18/2020 CLINICAL DATA:  Dizziness EXAM: MRI HEAD WITHOUT CONTRAST TECHNIQUE: Multiplanar, multiecho pulse sequences of the brain and surrounding structures were obtained without intravenous contrast. COMPARISON:  None. FINDINGS: Brain: No acute infarct, acute hemorrhage or extra-axial collection. Normal white matter signal. Normal volume of CSF spaces. Fewer than 5 scattered microhemorrhages in a nonspecific pattern. Normal midline structures. Vascular: Major flow voids are preserved. Skull and upper cervical spine: Normal calvarium and skull base. Visualized upper cervical spine and soft tissues are normal. Sinuses/Orbits:No paranasal sinus fluid levels or advanced mucosal thickening. No mastoid or middle ear effusion. Normal orbits.  IMPRESSION: 1. No acute intracranial abnormality. 2. Fewer than 5 scattered microhemorrhages in a nonspecific pattern. Electronically Signed   By: Ulyses Jarred M.D.   On: 08/18/2020 01:22     Current Facility-Administered Medications (Cardiovascular):  .  furosemide (LASIX) tablet 40 mg   Current Facility-Administered Medications (Respiratory):  .  magic mouthwash w/lidocaine*   Current Facility-Administered Medications (Analgesics):  .  acetaminophen (TYLENOL) tablet 650 mg **OR** acetaminophen (TYLENOL) suppository 650 mg ** .  aspirin EC tablet 81 mg     Current Facility-Administered Medications (Other):  .  0.45 % sodium chloride infusion .  gabapentin (NEURONTIN) capsule 100 mg .  gabapentin (NEURONTIN) capsule 300 mg .  LORazepam (ATIVAN) tablet 1 mg .  magic mouthwash w/lidocaine* .  pantoprazole (PROTONIX) EC tablet 40 mg .  potassium chloride SA (KLOR-CON) CR tablet 20 mEq  * These medications belong to multiple therapeutic classes and are listed under each applicable group. No current outpatient medications on file.  Anti-infectives (From admission, onward)   None       Continuous Infusions: . sodium chloride 75 mL/hr at 08/18/20 1203     LOS: 0 days    Para Skeans, MD [facility] Pager [phone] How to contact the [facility] Attending or Consulting provider [place] or covering provider during after hours [place], for this patient?    1. Check the care team in [facility] and look for a) attending/consulting [facility] provider listed and b) the [facility] team listed 2. Log into [email or web address] and use [place]'s universal password to access. If you do not have the password, please contact the hospital operator. 3. Locate the [facility] provider you are looking for under [facility] and page to a number that you can be directly reached. 4. If you still have difficulty reaching the provider, please page the [facility] (Director on Call) for the Hospitalists listed on amion for  assistance. [email or web address] Password [facility] 08/18/2020, 1:59 PM

## 2020-08-18 NOTE — Progress Notes (Signed)
   08/18/20 1735  Assess: MEWS Score  Temp 98.7 F (37.1 C)  BP 107/78  Pulse Rate (!) 114  Resp 16  SpO2 100 %  O2 Device Room Air  FiO2 (%) (!) 16 %  Assess: MEWS Score  MEWS Temp 0  MEWS Systolic 0  MEWS Pulse 2  MEWS RR 0  MEWS LOC 0  MEWS Score 2  MEWS Score Color Yellow  Assess: if the MEWS score is Yellow or Red  Were vital signs taken at a resting state? Yes  Focused Assessment Change from prior assessment (see assessment flowsheet)  Early Detection of Sepsis Score *See Row Information* Low  MEWS guidelines implemented *See Row Information* Yes  Take Vital Signs  Increase Vital Sign Frequency  Yellow: Q 2hr X 2 then Q 4hr X 2, if remains yellow, continue Q 4hrs  Escalate  MEWS: Escalate Yellow: discuss with charge nurse/RN and consider discussing with provider and RRT  Notify: Charge Nurse/RN  Name of Charge Nurse/RN Notified Ulice Dash  Date Charge Nurse/RN Notified 08/18/20  Time Charge Nurse/RN Notified 1900  Notify: Provider  Provider Name/Title opyd  Date Provider Notified 08/18/20  Time Provider Notified 1916  Notification Type Page  Notification Reason Change in status  Document  Patient Outcome Stabilized after interventions  Progress note created (see row info) Yes

## 2020-08-18 NOTE — Progress Notes (Signed)
OT Cancellation Note  Patient Details Name: Mary [name] MRN: [id number] DOB: 1961-06-14   Cancelled Treatment:    Reason Eval/Treat Not Completed: Patient not medically ready (Pt's INR level 10. So MD hold today 08/18/20.)   Jefferey Pica, OTR/L Acute Rehabilitation Services Pager: [phone] Office: [phone]  Jefferey Pica 08/18/2020, 12:47 PM

## 2020-08-18 NOTE — Consult Note (Addendum)
NEUROLOGY CONSULTATION NOTE   Date of service: August 18, 2020 Patient Name: Mary [name] MRN:  [id number] DOB:  05-Jul-1961 Reason for consult: "Drooling".  History of Present Illness  Mary [name] is a 59 y.o. 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 She missed her last Warfarin check leading to an INR over 10 on admission. She presented to the ED with c/o numbness to her extremities which has moved proximally to her trunk and face. She was found to have AKI which has been corrected with IV hydration. Patient reports an about 6 mos hx of neuropathy which started just in her feet. Her PCP placed her on Neurontin 327m po qhs. About a month later, she returned to her PCP because the 3059mdose was not helping anymore. Then, she was placed on 10060mo tid with continued dose of 300m44m bedtime. About 2 weeks ago, she began to notice more pain in her legs and hands and raised the dose of Neurontin to 300mg65mqid on her own. She did not report this to her PCP. Over the past week, she states her numbness has now traveled proximately to her face. Started at chin, then lips, then nose, then entire head. She also states her gait became wobbly and her vision was blurred. She fell at home without noted injury. She did hit her head. She normally walks with a cane and rides in an electric wheelchair when out in stores. She states her eating has been a lot less because of the pain and numbness in her mouth. She reports grinding of her teeth at night. She states she has bitten both sides of her tongue trying to eat. She denies any recent dental work. She admits to eating mostly meat and few vegetables. She states she has always been a small portion eater. Pain seems to be the main reason for the decreased intake.  When asked about ETOH consumption, she admits to 3 glasses of wine a day or at least every  other day. When prompted, she states that maybe she has been drinking more at home since she lost her job. However, she states she has not had any ETOH in 2 weeks.  She admits to blurry vision at a distance, like watching TV. She also states over the past week, she has had to hold her phone very close to her face to see. She denies hx of muscle diagnoses or autoimmune problems. She however does have a sister who is diagnosed with sarcoidosis. Denies thyroid issues. She does not take any vitamins at home except Vit D and Folate. Her sister has sarcoidosis.   ROS   A detailed ROS was completed and was negative except for above.  Past History   Past Medical History:  Diagnosis Date  . Acute diastolic heart failure (HCC) [place]0/2014  . Anemia   . B12 deficiency   . Carotid stenosis    Carotid US 2/[place]: bilateral ICA 1-39% >> FU prn  . Childhood asthma   . Complete heart block (HCC) [place] Epigastric hernia 200's  . GERD (gastroesophageal reflux disease)   . Heart murmur   . History of blood transfusion    "14 w/1st pregnancy; 2 w/last C-section" (03/15/2013)  . Hx of echocardiogram    a. Echo 4/16:  Mild focal basal septal hypertrophy, EF 60-65%, no RWMA, Mechanical MVR ok with mild central regurgitation and  no perivalvular leak, small mobile density attached to valvular ring (1.5x1 cm) - post surgical changes vs SBE, mild LAE;   b. Echo 2/17:  Mild post wall LVH, EF 55-60%, no RWMA, mechanical MVR ok (mean 5 mmHg), normal RVSF  . Hypertension    no pcp   will go to [facility]  . Macrocytic anemia   . 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 S/P mitral valve replacement with metallic valve 0/04/6760   36m Sorin Carbomedics mechanical prosthesis via right mini thoracotomy approach  . 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 Severe mitral regurgitation 04/22/2013  . Sinus tachycardia   . SVT ([place]raventricular tachycardia) [place]  . TIA (transient ischemic attack)   . Tricuspid regurgitation    Past Surgical History:  Procedure Laterality Date  . APPENDECTOMY  ~[id number]. BIOPSY  05/01/2020   Procedure: BIOPSY;  Surgeon: JaMilus BanisterMD;  [facility]: [facility]oscopy;;  . CARDIAC CATHETERIZATION    . CARDIAC VALVE REPLACEMENT    . CESAREAN SECTION  1983  . CESAREAN SECTION WITH BILATERAL TUBAL LIGATION  1999  . COLONOSCOPY WITH PROPOFOL N/A 05/01/2020   Procedure: COLONOSCOPY WITH PROPOFOL;  Surgeon: JaMilus BanisterMD;  [facility];  Service: Endoscopy;  Laterality: N/A;  . ESOPHAGOGASTRODUODENOSCOPY (EGD) WITH PROPOFOL N/A 05/01/2020   Procedure: ESOPHAGOGASTRODUODENOSCOPY (EGD) WITH PROPOFOL;  Surgeon: JaMilus BanisterMD;  [facility];  Service: Endoscopy;  Laterality: N/A;  . FEMORAL HERNIA REPAIR Right 05/23/2013   Procedure: HERNIA REPAIR FEMORAL;  Surgeon: ClRexene AlbertsMD;  [facility] [place]: Open [facility] Surgery;  Laterality: Right;  . INTRAOPERATIVE TRANSESOPHAGEAL ECHOCARDIOGRAM N/A 05/23/2013   Procedure: INTRAOPERATIVE TRANSESOPHAGEAL ECHOCARDIOGRAM;  Surgeon: ClRexene AlbertsMD;  [facility]: Open [facility] Surgery;  Laterality: N/A;  . LAPAROSCOPIC CHOLECYSTECTOMY  2003  . LEFT AND RIGHT HEART CATHETERIZATION WITH CORONARY ANGIOGRAM N/A 03/22/2013   Procedure: LEFT AND RIGHT HEART CATHETERIZATION WITH CORONARY ANGIOGRAM;  Surgeon: ChBurnell BlanksMD;  [facility];  Service: Cardiovascular;  Laterality: N/A;  . MINIMALLY INVASIVE TRICUSPID VALVE REPAIR Right 05/23/2013   Procedure: MINIMALLY INVASIVE TRICUSPID VALVE REPAIR;  Surgeon: ClRexene AlbertsMD;  [facility]: Open [facility] Surgery;  Laterality: Right;  . MITRAL VALVE REPLACEMENT N/A 05/23/2013   Procedure: MITRAL VALVE  (MV) REPLACEMENT;  Surgeon: ClRexene AlbertsMD;  [place] [facility] [place] [facility]: Open [facility] Surgery;  Laterality: N/A;  . MULTIPLE EXTRACTIONS WITH ALVEOLOPLASTY N/A 04/04/2013   Procedure: Extraction of tooth #'s 1,8,9,13,14,15,23,24,25,26 with alveoloplasty and gross debridement of remaining teeth;  Surgeon: RoLenn CalDDS;  [place] [facility] [facility]: Oral Surgery;  Laterality: N/A;  . TEE WITHOUT CARDIOVERSION N/A 03/18/2013   Procedure: TRANSESOPHAGEAL ECHOCARDIOGRAM (TEE);  Surgeon: DaLarey DresserMD;  [place] Service: Cardiovascular;  Laterality: N/A;  . TEE WITHOUT CARDIOVERSION N/A 06/17/2013   Procedure: TRANSESOPHAGEAL ECHOCARDIOGRAM (TEE);  Surgeon: BrLelon PerlaMD;  [facility];  Service: Cardiovascular;  Laterality: N/A;  . TUBAL LIGATION  1999   Family History  Problem Relation Age of Onset  . Stroke Father   . Cancer Father   . Sarcoidosis Sister   . Heart attack Neg Hx   . Colon cancer Neg Hx   .  Stomach cancer Neg Hx   . Pancreatic cancer Neg Hx   . Esophageal cancer Neg Hx    Social History   Socioeconomic History  . Marital status: Single    Spouse name: Not on file  . Number of children: Not on file  . Years of education: Not on file  . Highest education level: Not on file  Occupational History  . Not on file  Tobacco Use  . Smoking status: Former Smoker    Packs/day: 0.50    Years: 36.00    Pack years: 18.00    Types: Cigarettes    Quit date: 03/15/2013    Years since quitting: 7.4  . Smokeless tobacco: Never Used  Vaping Use  . Vaping Use: Never used  Substance and Sexual Activity  . Alcohol use: Yes    Comment:  "glass of wine"  . Drug use: No  . Sexual activity: Never  Other Topics Concern  . Not on file  Social History Narrative  . Not on file   Social Determinants of Health   Financial Resource Strain:   . Difficulty of Paying Living Expenses: Not on file  Food Insecurity:   . Worried About Paediatric nurse in the Last Year: Not on file  . Ran Out of Food in the Last Year: Not on file  Transportation Needs:   . Lack of Transportation (Medical): Not on file  . Lack of Transportation (Non-Medical): Not on file  Physical Activity:   . Days of Exercise per Week: Not on file  . Minutes of Exercise per Session: Not on file  Stress:   . Feeling of Stress : Not on file  Social Connections:   . Frequency of Communication with Friends and Family: Not on file  . Frequency of Social Gatherings with Friends and Family: Not on file  . Attends Religious Services: Not on file  . Active Member of Clubs or Organizations: Not on file  . Attends Archivist Meetings: Not on file  . Marital Status: Not on file   Allergies  Allergen Reactions  . Oxycodone Itching  . Tape Rash and Other (See Comments)    THE ONLY TAPE THAT IS TOLERATED IS PAPER TAPE. EKG leads inflame the skin    Medications  (Not in a hospital admission)    Vitals   Vitals:   08/18/20 0615 08/18/20 0630 08/18/20 0645 08/18/20 0729  BP: 132/90 125/77 127/80 (!) 112/96  Pulse: (!) 106 (!) 105 (!) 106 100  Resp: _0 Temp:    97.8 F (36.6 C)  TempSrc:    Oral  SpO2: 97% 100% 99% 100%  Height:         Body mass index is 20.49 kg/m.  Physical Exam   General: Laying comfortably in bed; in no acute distress. HENT: Normal oropharynx and mucosa. Normal external appearance of ears and nose. Neck: Supple, no pain or tenderness CV: No JVD. No peripheral edema. Pulmonary: Symmetric Chest rise. Normal respiratory effort. Abdomen: Soft to touch, non-tender. Ext: No cyanosis, edema, or deformity Skin: No rash. Normal palpation of skin.  Musculoskeletal: Normal digits and nails by inspection. No clubbing. Tenderness to palpation of muscles.  Neurologic Examination  Mental status/Cognition: Alert, oriented to self, place, month and year, good attention. Speech/language: Fluent, comprehension intact,  object naming intact, repetition intact. Cranial nerves:   CN II Pupils equal and reactive to light, tunneling of her vision with decreased acuity in central  vision BL. She uses reading glasses and does not have them with her thou.   CN III,IV,VI EOM intact, no gaze preference or deviation, no nystagmus   CN V normal sensation in V1, V2, and V3 segments bilaterally   CN VII no asymmetry, no nasolabial fold flattening   CN VIII normal hearing to speech   CN IX & X normal palatal elevation, no uvular deviation   CN XI 5/5 head turn and 5/5 shoulder shrug bilaterally   CN XII midline tongue protrusion   Motor:  Muscle bulk: poor, tone normal, pronator drift none tremor none. Strength exam was somewhat limited due to pain. Mvmt Root Nerve  Muscle Right Left Comments  SA C5/6 Ax Deltoid 4+ 4+   EF C5/6 Mc Biceps 4 4   EE C6/7/8 Rad Triceps 4 4   WF C6/7 Med FCR 4 4   WE C7/8 PIN ECU 4 4   F Ab C8/T1 U ADM/FDI 4 4   HF L1/2/3 Fem Illopsoas 3 3-   KE L2/3/4 Fem Quad 3 3-   DF L4/5 D Peron Tib Ant 4 4   PF S1/2 Tibial Grc/Sol 4 4    Reflexes:  Right Left Comments  Pectoralis      Biceps (C5/6) 1 1   Brachioradialis (C5/6) 2 2    Triceps (C6/7) 2 2    Patellar (L3/4) 1 1    Achilles (S1) 1 1    Hoffman      Plantar     Jaw jerk    Sensation:  Light touch Decreased to touch in LUE and LLE compared to RUE and RLE.   Pin prick Decreased in LUE and LLE compared to RUE and RLE.   Temperature    Vibration   Proprioception    Coordination/Complex Motor:  - Finger to Nose intact BL - Heel to shin unable to perform due to weakness but no obvious tremors or ataxia in her legs noted. - Rapid alternating movement are slowed. - Gait: deferred due to weakness.  Labs   CBC:  Recent Labs  Lab 08/17/20 1605 08/18/20 0004  WBC 5.4  --   NEUTROABS 3.7  --   HGB 9.4* 9.2*  HCT 27.0* 27.0*  MCV 126.8*  --   PLT 368  --     Basic Metabolic Panel:  Lab Results  Component Value Date    NA 135 08/18/2020   K 3.9 08/18/2020   CO2 16 (L) 08/17/2020   GLUCOSE 89 08/17/2020   BUN 16 08/17/2020   CREATININE 1.37 (H) 08/17/2020   CALCIUM 8.7 (L) 08/17/2020   GFRNONAA 45 (L) 08/17/2020   GFRAA 92 07/03/2020   Lipid Panel:  Lab Results  Component Value Date   LDLCALC 61 05/31/2018   HgbA1c:  Lab Results  Component Value Date   HGBA1C 4.8 06/06/2016   Urine Drug Screen:     Component Value Date/Time   LABOPIA NONE DETECTED 12/30/2019 1704   COCAINSCRNUR NONE DETECTED 12/30/2019 1704   LABBENZ NONE DETECTED 12/30/2019 1704   AMPHETMU NONE DETECTED 12/30/2019 1704   THCU POSITIVE (A) 12/30/2019 1704   LABBARB NONE DETECTED 12/30/2019 1704    Alcohol Level     Component Value Date/Time   ETH <5 06/05/2016 2017   MR BRAIN WO CONTRAST IMPRESSION: 1. No acute intracranial abnormality. 2. Fewer than 5 scattered microhemorrhages in a nonspecific pattern.  Impression   Mary [name] is a 59 y.o. 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 Who presents with various neurological symptoms of different duration including worsening of baseline subjective numbness and pain which progressed from just her feet to her things and hands and worsening of baseline lip and perioral numbness and now feeling burning pain in her mouth. She has had difficulty with PO intake and has only been taking liquid/semisolid food over the last 2 weeks and endorses that her muscles are very tender. She does drink alcohol and endorses to poor food intake. Given multiple neurological symptoms, differential is broad and mostly along the line of nutritional deficiencies vs alcohol induced myopathy/neuropathy or a potential underlying autoimmune process.  Recommendations  - I ordered workup with ESR, CRP, ANA, ENA, Heavy metals,, Serum ACE and Soluble IL2 receptor, RF - I ordered serum CK, vit B6,  B12, folate, MMA, SPEP - recommend starring Multivitamin PO daily after nutritional vitamins are collected. - We will continue to follow along ______________________________________________________________________   Thank you for the opportunity to take part in the care of this patient. If you have any further questions, please contact the neurology consultation attending.  Signed,  [place] Pager Number [phone]

## 2020-08-18 NOTE — ED Notes (Signed)
[phone], unable to take report at this time, call back number provided.

## 2020-08-18 NOTE — Consult Note (Addendum)
[facility] Consult: 8:45 AM 08/18/2020  LOS: 0 days    Referring Provider: Dr. Florina Ou Primary Care Physician:  Antony Blackbird, MD Primary Gastroenterologist:  Dr. Havery Moros.   Reason for Consultation:  Anemia.     HPI: Mary [name] is a 59 y.o. female.  PMH status post mechanical MVR and tricuspid valve repair 2014, chronic Coumadin.    Macrocytic anemia.  Transfused 1 PRBC for Hb 6.9 in 12/2019.  2 PRBCs 2014.  B12 level WNL on 06/26/2020.  Folate deficient (3.7) 04/24/2020 buy normal at > 20 on 06/03/2020.  Diastolic heart failure.  Chronic diarrhea, stool path PCR and C. difficile testing negative this year.  04/2020 colonoscopy.  Scattered areas of mild barotrauma due to insufflation of the colon.  No polyps or cancers. 04/2020 EGD. For macrocytic anemia, nausea, vomiting, abnormal GE junction on recent CT scan. Normal esophagus.  Small hiatal hernia.  Pyloric stenosis w nonneoplastic appearing mucosa.  This would not allow for passage of adult gastroscope, mucosa biopsied.  Moderate inflammation with friability/granularity in antrum, biopsied.  Dr. Ardis Hughs suspected previous heavy use of NSAIDs, aspirin powders led to the pyloric stenosis and was the cause of her chronic nausea vomiting. Gastric pathology showed mild chronic erosive gastritis, no H. pylori, dysplasia, malignancy.  Pyloric channel biopsies showed peptic changes.  Recently having difficult to manage anticoagulation.  INR as high as 7.6 in late September, 5 on 10/15, 3.7 on 11/2.    Seen in ED on 11/19.  She was leaning forward on a stool and it tipped over.  She fell, hit her head and shoulder.  No loss of consciousness.  CT head/cervical spine and thoracic spine and shoulder films all nonacute.  Hb 10.2.  She was discharged that  day.  Typically walks with a cane.  Chronic but worsening blurry/poor vision.  Decreased sensation from neuropathy on her face, hands, legs is chronic, treats less than successfully with gabapentin at more frequent dosing interval than prescribed.  Also complains of excessive drooling, constant runny nose and tearing from her eyes.  Presented to the ED on 1122 for evaluation and was subsequently admitted. Has not been eating or drinking much due to the paresthesias in her lip and mouth Drinks a half a bottle of Moscato daily.  Denies use of illicit drugs including marijuana.  Denies use of CBD products. Daily brown stools.  No melena, no rectal bleeding.  No dysphagia.  No nausea, vomiting.  Has not eaten a good meal in 2 weeks because of the troubles with excessive salivation and numbness/tingling in her mouth/lips.  Taking meds with pudding or applesauce and is also eating Jell-O.  She is taking in at least 3 Ensure daily.  Reports weight drop of about 15 to 20 pounds over a couple of weeks.  However in late April weight 52.2 kg, in late July weight 49.9 kg.  Current recorded weight is 50.8 kg.  Compliant with daily folic acid, Protonix 40 mg/day, ASA 81 mg/day. Takes up to 8 extra strength Tylenol daily for various musculoskeletal  and neuropathic pain complaints.  Hb 9.4 >> 9.2.  Was 8.6 in the first week of October, 10.2 3 days ago.  MCV 126.  Platelets 368.  INR greater than 10. K5.  Although the creatinine is elevated at 1.3, BUN is normal at 16. T bili 1.3, alk phos 114, AST/ALT 92/47. MRI brain showing several punctate hemorrhages.   Social history Patient lives alone.  Drinks 1/2 bottle Moscato daily.  No cigs/tobacco.    Past Medical History:  Diagnosis Date  . Acute diastolic heart failure ([place]) 03/15/2013  . Anemia   . B12 deficiency   . Carotid stenosis    Carotid US 2/17: bilateral ICA 1-39% >> FU prn  . Childhood asthma   . Complete heart block ([place])   . Epigastric hernia  200's  . GERD (gastroesophageal reflux disease)   . Heart murmur   . History of blood transfusion    "14 w/1st pregnancy; 2 w/last C-section" (03/15/2013)  . Hx of echocardiogram    a. Echo 4/16:  Mild focal basal septal hypertrophy, EF 60-65%, no RWMA, Mechanical MVR ok with mild central regurgitation and no perivalvular leak, small mobile density attached to valvular ring (1.5x1 cm) - post surgical changes vs SBE, mild LAE;   b. Echo 2/17:  Mild post wall LVH, EF 55-60%, no RWMA, mechanical MVR ok (mean 5 mmHg), normal RVSF  . Hypertension    no pcp   will go to [facility]  . Macrocytic anemia   . 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 S/P mitral valve replacement with metallic valve 05/13/2992   8m Sorin Carbomedics mechanical prosthesis via right mini thoracotomy approach  . 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 Severe mitral regurgitation 04/22/2013  . Sinus tachycardia   . SV[place]) [place]  . TIA (transient ischemic attack)   . Tricuspid regurgitation     Past Surgical History:  Procedure Laterality Date  . APPENDECTOMY  ~[id number]. BIOPSY  05/01/2020   Procedure: BIOPSY;  Surgeon: JaMilus BanisterMD;  [place] [facility] Service: Endoscopy;;  . CARDIAC CATHETERIZATION    . CARDIAC VALVE REPLACEMENT    . CESAREAN SECTION  1983  . CESAREAN SECTION WITH BILATERAL TUBAL LIGATION  1999  . COLONOSCOPY WITH PROPOFOL N/A 05/01/2020   Procedure: COLONOSCOPY WITH PROPOFOL;  Surgeon: JaMilus BanisterMD;  [facility];  Service: Endoscopy;  Laterality: N/A;  . ESOPHAGOGASTRODUODENOSCOPY (EGD) WITH PROPOFOL N/A 05/01/2020   Procedure: ESOPHAGOGASTRODUODENOSCOPY (EGD) WITH PROPOFOL;  Surgeon: JaMilus BanisterMD;  [facility];  Service: Endoscopy;   Laterality: N/A;  . FEMORAL HERNIA REPAIR Right 05/23/2013   Procedure: HERNIA REPAIR FEMORAL;  Surgeon: ClRexene AlbertsMD;  [facility] Service: Open Heart Surgery;  Laterality: Right;  . INTRAOPERATIVE TRANSESOPHAGEAL ECHOCARDIOGRAM N/A 05/23/2013   Procedure: INTRAOPERATIVE TRANSESOPHAGEAL ECHOCARDIOGRAM;  Surgeon: ClRexene AlbertsMD;  [place] [facility] Service: Open Heart Surgery;  Laterality: N/A;  . LAPAROSCOPIC CHOLECYSTECTOMY  2003  . LEFT AND RIGHT HEART CATHETERIZATION WITH CORONARY ANGIOGRAM N/A 03/22/2013   Procedure: LEFT AND RIGHT HEART CATHETERIZATION WITH CORONARY ANGIOGRAM;  Surgeon: ChBurnell BlanksMD;  [facility];  Service: Cardiovascular;  Laterality: N/A;  . MINIMALLY INVASIVE TRICUSPID VALVE REPAIR Right 05/23/2013   Procedure: MINIMALLY INVASIVE TRICUSPID VALVE REPAIR;  Surgeon:  Rexene Alberts, MD;  Location: [place];  Service: Open Heart Surgery;  Laterality: Right;  . MITRAL VALVE REPLACEMENT N/A 05/23/2013   Procedure: MITRAL VALVE (MV) REPLACEMENT;  Surgeon: Rexene Alberts, MD;  Location: [place];  Service: Open Heart Surgery;  Laterality: N/A;  . MULTIPLE EXTRACTIONS WITH ALVEOLOPLASTY N/A 04/04/2013   Procedure: Extraction of tooth #'s 1,8,9,13,14,15,23,24,25,26 with alveoloplasty and gross debridement of remaining teeth;  Surgeon: Lenn Cal, DDS;  Location: [place];  Service: Oral Surgery;  Laterality: N/A;  . TEE WITHOUT CARDIOVERSION N/A 03/18/2013   Procedure: TRANSESOPHAGEAL ECHOCARDIOGRAM (TEE);  Surgeon: Larey Dresser, MD;  Location: [place];  Service: Cardiovascular;  Laterality: N/A;  . TEE WITHOUT CARDIOVERSION N/A 06/17/2013   Procedure: TRANSESOPHAGEAL ECHOCARDIOGRAM (TEE);  Surgeon: Lelon Perla, MD;  Location: [place];  Service: Cardiovascular;  Laterality: N/A;  . [place]    Prior to Admission medications   Medication Sig Start Date End Date Taking? Authorizing Provider  acetaminophen (TYLENOL)  500 MG tablet Take 1,000 mg by mouth every 6 (six) hours as needed for headache.   Yes [provider]  aspirin EC 81 MG tablet Take 1 tablet (81 mg total) by mouth daily. 08/26/13  Yes Larey Dresser, MD  folic acid (FOLVITE) 1 MG tablet Take 1 mg by mouth daily.   Yes [provider]  gabapentin (NEURONTIN) 100 MG capsule Take 1 capsule (100 mg total) by mouth 2 (two) times daily. And continue 300 mg at bedtime 06/26/20  Yes Fulp, Cammie, MD  gabapentin (NEURONTIN) 300 MG capsule Take 1 capsule (300 mg total) by mouth at bedtime. As needed for nerve pain Patient taking differently: Take 300 mg by mouth in the morning, at noon, and at bedtime.  06/05/20  Yes Fulp, Cammie, MD  Hyprom-Naphaz-Polysorb-Zn Sulf (CLEAR EYES COMPLETE OP) Place 2 drops into both eyes daily as needed (red eyes).   Yes [provider]  pantoprazole (PROTONIX) 40 MG tablet Take 1 tablet (40 mg total) by mouth daily. 05/05/20 08/14/21 Yes [place] Donnamarie Poag, DO  Vitamin D, Ergocalciferol, (DRISDOL) 1.25 MG (50000 UNIT) CAPS capsule Take 1 capsule (50,000 Units total) by mouth every 7 (seven) days. Patient taking differently: Take 50,000 Units by mouth every Tuesday.  06/07/20  Yes Gildardo Pounds, NP  warfarin (COUMADIN) 5 MG tablet Take 1 - 1/2 tablet daily or as directed by Coumadin clinic. Patient taking differently: Take 2.5-5 mg by mouth See admin instructions. Take 1 - 1/2 tablet daily or as directed by Coumadin clinic.Wed and Sun 2.5 mg, 5 mg all other days 07/28/20  Yes Evans Lance, MD  furosemide (LASIX) 40 MG tablet Take 1 tablet (40 mg total) by mouth daily. Patient not taking: Reported on 08/18/2020 05/06/20   Jerline Pain, MD  potassium chloride SA (KLOR-CON) 20 MEQ tablet Take 1 tablet (20 mEq total) by mouth daily. Patient not taking: Reported on 08/18/2020 05/06/20   Jerline Pain, MD    Scheduled Meds: . aspirin EC  81 mg Oral Daily  . furosemide  40 mg Oral Daily  . gabapentin   100 mg Oral BID  . gabapentin  300 mg Oral QHS  . pantoprazole  40 mg Oral Daily  . potassium chloride SA  20 mEq Oral Daily   Infusions: . sodium chloride    . phytonadione (VITAMIN K) IV     PRN Meds: acetaminophen **OR** acetaminophen, LORazepam, LORazepam   Allergies as of  08/17/2020 - Review Complete 08/17/2020  Allergen Reaction Noted  . Oxycodone Itching 04/15/2013  . Tape Rash and Other (See Comments) 08/14/2020    Family History  Problem Relation Age of Onset  . Stroke Father   . Cancer Father   . Sarcoidosis Sister   . Heart attack Neg Hx   . Colon cancer Neg Hx   . Stomach cancer Neg Hx   . Pancreatic cancer Neg Hx   . Esophageal cancer Neg Hx     Social History   Socioeconomic History  . Marital status: Single    Spouse name: Not on file  . Number of children: Not on file  . Years of education: Not on file  . Highest education level: Not on file  Occupational History  . Not on file  Tobacco Use  . Smoking status: Former Smoker    Packs/day: 0.50    Years: 36.00    Pack years: 18.00    Types: Cigarettes    Quit date: 03/15/2013    Years since quitting: 7.4  . Smokeless tobacco: Never Used  Vaping Use  . Vaping Use: Never used  Substance and Sexual Activity  . Alcohol use: Yes    Comment:  "glass of wine"  . Drug use: No  . Sexual activity: Never  Other Topics Concern  . Not on file  Social History Narrative  . Not on file   Social Determinants of Health   Financial Resource Strain:   . Difficulty of Paying Living Expenses: Not on file  Food Insecurity:   . Worried About Charity fundraiser in the Last Year: Not on file  . Ran Out of Food in the Last Year: Not on file  Transportation Needs:   . Lack of Transportation (Medical): Not on file  . Lack of Transportation (Non-Medical): Not on file  Physical Activity:   . Days of Exercise per Week: Not on file  . Minutes of Exercise per Session: Not on file  Stress:   . Feeling of Stress :  Not on file  Social Connections:   . Frequency of Communication with Friends and Family: Not on file  . Frequency of Social Gatherings with Friends and Family: Not on file  . Attends Religious Services: Not on file  . Active Member of Clubs or Organizations: Not on file  . Attends Archivist Meetings: Not on file  . Marital Status: Not on file  Intimate Partner Violence:   . Fear of Current or Ex-Partner: Not on file  . Emotionally Abused: Not on file  . Physically Abused: Not on file  . Sexually Abused: Not on file    REVIEW OF SYSTEMS: Constitutional: No profound weakness or fatigue. ENT:  No nose bleeds Pulm: Cough or dyspnea. CV:  No palpitations, no LE edema.  No angina GU:  No hematuria, no frequency.  No dysuria, no oliguria. GI: See HPI. Heme: Unusual bleeding or bruising. Transfusions: See HPI Neuro: See HPI.  Blurry vision, decreased visual acuity Derm:  No itching, no rash or sores.  Endocrine:  No sweats or chills.  No polyuria or dysuria Immunization: Did receive COVID-19 vaccine in August 2021. Travel:  None beyond local counties in last few months.    PHYSICAL EXAM: Vital signs in last 24 hours: Vitals:   08/18/20 0645 08/18/20 0729  BP: 127/80 (!) 112/96  Pulse: (!) 106 100  Resp: 14 17  Temp:  97.8 F (36.6 C)  SpO2: 99% 100%  Wt Readings from Last 3 Encounters:  06/26/20 50.8 kg  05/08/20 57.5 kg  05/03/20 49.8 kg    General: Comfortable, alert, looks mildly chronically ill.  Affect animated Head: No facial asymmetry or swelling.  No signs of head trauma. Eyes: Scleral icterus or conjunctival pallor.  EOMI. Ears: Not hard of hearing Nose: No congestion or discharge noted. Mouth: Poor dentition, multiple absent teeth.  Mucosa normal.  No excessive saliva or drooling noted.  However when I examined her chest she held a washcloth to her chin in case she was going to drool. Neck: No JVD, no masses, no thyromegaly Lungs: Clear  bilaterally.  Excellent breath sounds.  No labored breathing, no cough Heart: Slightly tachycardic but regular rhythm in the low 100s. Abdomen: Soft, nondistended, nontender.  No HSM, masses, bruits, hernias..   Rectal: Deferred Musc/Skeltl: No joint redness, swelling or gross deformity. Extremities: No CCE. Neurologic: Oriented x3.  Moves all 4 limbs.  No tremor.  Strength not tested.  Her feet are quite sensitive to light touch. Skin: No rash, no sores, no telangiectasia, no significant purpura or bruising Nodes: No cervical adenopathy Psych: Pleasant.  Animated.  Mildly pressured but fluid speech.  Intake/Output from previous day: No intake/output data recorded. Intake/Output this shift: No intake/output data recorded.  LAB RESULTS: Recent Labs    08/17/20 1605 08/18/20 0004  WBC 5.4  --   HGB 9.4* 9.2*  HCT 27.0* 27.0*  PLT 368  --    BMET Lab Results  Component Value Date   NA 135 08/18/2020   NA 134 (L) 08/17/2020   NA 133 (L) 08/14/2020   K 3.9 08/18/2020   K 5.0 08/17/2020   K 4.6 08/14/2020   CL 104 08/17/2020   CL 103 08/14/2020   CL 105 07/03/2020   CO2 16 (L) 08/17/2020   CO2 21 07/03/2020   CO2 20 06/26/2020   GLUCOSE 89 08/17/2020   GLUCOSE 66 (L) 08/14/2020   GLUCOSE 84 07/03/2020   BUN 16 08/17/2020   BUN 14 08/14/2020   BUN 7 07/03/2020   CREATININE 1.37 (H) 08/17/2020   CREATININE 1.20 (H) 08/14/2020   CREATININE 0.82 07/03/2020   CALCIUM 8.7 (L) 08/17/2020   CALCIUM 9.3 07/03/2020   CALCIUM 9.3 06/26/2020   LFT Recent Labs    08/17/20 1605  PROT 5.8*  ALBUMIN 3.0*  AST 92*  ALT 47*  ALKPHOS 114  BILITOT 1.3*   PT/INR Lab Results  Component Value Date   INR >10.0 (HH) 08/18/2020   INR 3.7 (A) 07/28/2020   INR 5.0 (A) 07/10/2020   Hepatitis Panel No results for input(s): HEPBSAG, HCVAB, HEPAIGM, HEPBIGM in the last 72 hours. C-Diff No components found for: CDIFF Lipase     Component Value Date/Time   LIPASE 15 04/23/2020  1219    Drugs of Abuse     Component Value Date/Time   LABOPIA NONE DETECTED 12/30/2019 1704   COCAINSCRNUR NONE DETECTED 12/30/2019 1704   LABBENZ NONE DETECTED 12/30/2019 1704   AMPHETMU NONE DETECTED 12/30/2019 1704   THCU POSITIVE (A) 12/30/2019 1704   LABBARB NONE DETECTED 12/30/2019 1704     RADIOLOGY STUDIES: MR BRAIN WO CONTRAST  Result Date: 08/18/2020 CLINICAL DATA:  Dizziness EXAM: MRI HEAD WITHOUT CONTRAST TECHNIQUE: Multiplanar, multiecho pulse sequences of the brain and surrounding structures were obtained without intravenous contrast. COMPARISON:  None. FINDINGS: Brain: No acute infarct, acute hemorrhage or extra-axial collection. Normal white matter signal. Normal volume of CSF spaces. Fewer than  5 scattered microhemorrhages in a nonspecific pattern. Normal midline structures. Vascular: Major flow voids are preserved. Skull and upper cervical spine: Normal calvarium and skull base. Visualized upper cervical spine and soft tissues are normal. Sinuses/Orbits:No paranasal sinus fluid levels or advanced mucosal thickening. No mastoid or middle ear effusion. Normal orbits. IMPRESSION: 1. No acute intracranial abnormality. 2. Fewer than 5 scattered microhemorrhages in a nonspecific pattern. Electronically Signed   By: Ulyses Jarred M.D.   On: 08/18/2020 01:22     IMPRESSION:   *   Anemia.  Chronic with previous blood transfusions in 12/2019 and in 2014.  Hb improved compared with 6 weeks ago, down about 1 g compared with 3 days ago.  No overt bleeding GI or otherwise. Macrocytosis persists despite normalization of folate level and normal B12 level.  Compliant with daily folic acid. EGD and colonoscopy in August 2021 showing pyloric stenosis with some narrowing and H. pylori negative antral gastritis.  This was attributed to recent heavy use of NSAIDs/ASA which she no longer uses.  The colonoscopy then was negative other than some barotrauma due to air insufflation during  procedure.  *     Supratherapeutic INR at greater than 10 with recent difficult to control INR leading to decreased Coumadin doses. Has received a dose of IV vitamin K. Takes Coumadin for history of mechanical MVR.  *    Excessive oral salivation.  Paresthesias of face, mouth, lips.  *    Peripheral neuropathy.  Taking more than prescribed doses of gabapentin.  *   Elevated LFTs.  Borderline ETOH abuse, 1/2 bottle wine daily Also excessive daily APAP use.     PLAN:     *   No role for repeat or additional endoscopic studies. Suggest referral to hematology as outpatient. Diet as tolerated.  She was eating some grits when I interviewed her in the room.   Azucena Freed  08/18/2020, 8:45 AM Phone [phone]

## 2020-08-18 NOTE — H&P (Signed)
History and Physical    Mary [name] BZJ:[id number] DOB: [birth year] DOA: 08/17/2020  PCP: Antony Blackbird, MD (Confirm with patient/family/NH records and if not entered, this has to be entered at [facility] point of entry) Patient coming from: home  I have personally briefly reviewed patient's old medical records in [place]  Chief Complaint: dehydration, weakness, drooling.  HPI: Mary [name] is a 59 y.o. female with medical history significant of multiple medical problems listed below. She has a peripheral neuropathy for which she was prescribed neurontin - 100 mg bid and 300 mg at bedtime. She reports that due to paresthesia affecting lips and mouth she hasn't been eating or drinking fluids, living on ensure, pudding and jello. Over the past week, or so, she has increased her dose up to 1200 mg daily. With the increased dose she has noticed problems with weakness, drooling, dizziness. Due to these symptoms she presents to [facility]. She has not had chest pain, focal weakness, visual changes, respiratory symtoms, no N/V, no Diarrhea   ED Course: BP 139/82  HR 112  RR 22. EDP exam revealed symmetrical BLE tenderness. Lab significant for K =- 5.0, which on repeat was 3.9. Cr 1.37 ( 0.82 07/03/20, 1.20 08/14/20) Alb 3.0, AST 92, ALT 47, NH3 49, CBC nl, INR >10. MRI brain w/o acute findings but several punctate hemorrhages noted. TRH called to admit for rehydration and monitoring of sympotms. Per EDP neuro was consulted and in light of MRI w/o acute changes agreed with rehydration - if symptoms persist to request formal neuro consult.   Review of Systems: As per HPI otherwise 10 point review of systems negative.    Past Medical History:  Diagnosis Date  . Acute diastolic heart failure ([place]) 03/15/2013  . Anemia   . B12 deficiency   . Carotid stenosis    Carotid US 2/17: bilateral ICA 1-39% >> FU prn  . Childhood asthma   . Complete heart block ([place])   . Epigastric hernia 200's   . GERD (gastroesophageal reflux disease)   . Heart murmur   . History of blood transfusion    "14 w/1st pregnancy; 2 w/last C-section" (03/15/2013)  . Hx of echocardiogram    a. Echo 4/16:  Mild focal basal septal hypertrophy, EF 60-65%, no RWMA, Mechanical MVR ok with mild central regurgitation and no perivalvular leak, small mobile density attached to valvular ring (1.5x1 cm) - post surgical changes vs SBE, mild LAE;   b. Echo 2/17:  Mild post wall LVH, EF 55-60%, no RWMA, mechanical MVR ok (mean 5 mmHg), normal RVSF  . Hypertension    no pcp   will go to [facility]  . Macrocytic anemia   . 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 S/P mitral valve replacement with metallic valve 0/17/5102   18mm Sorin Carbomedics mechanical prosthesis via right mini thoracotomy approach  . 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 Severe mitral regurgitation 04/22/2013  . Sinus tachycardia   . SVT (supraventricular tachycardia) ([place]) 03/16/2013  . TIA (transient ischemic attack)   . Tricuspid regurgitation     Past Surgical History:  Procedure Laterality Date  . APPENDECTOMY  [id number]  . BIOPSY  05/01/2020   Procedure: BIOPSY;  Surgeon: Milus Banister, MD;  Location: [place];  Service: Endoscopy;;  . CARDIAC CATHETERIZATION    .  CARDIAC VALVE REPLACEMENT    . CESAREAN SECTION  1983  . CESAREAN SECTION WITH BILATERAL TUBAL LIGATION  1999  . COLONOSCOPY WITH PROPOFOL N/A 05/01/2020   Procedure: COLONOSCOPY WITH PROPOFOL;  Surgeon: Milus Banister, MD;  Location: [facility] ENDOSCOPY;  Service: Endoscopy;  Laterality: N/A;  . ESOPHAGOGASTRODUODENOSCOPY (EGD) WITH PROPOFOL N/A 05/01/2020   Procedure: ESOPHAGOGASTRODUODENOSCOPY (EGD) WITH PROPOFOL;  Surgeon: Milus Banister, MD;  Location: [facility] ENDOSCOPY;  Service: Endoscopy;   Laterality: N/A;  . FEMORAL HERNIA REPAIR Right 05/23/2013   Procedure: HERNIA REPAIR FEMORAL;  Surgeon: Rexene Alberts, MD;  Location: [place];  Service: Open Heart Surgery;  Laterality: Right;  . INTRAOPERATIVE TRANSESOPHAGEAL ECHOCARDIOGRAM N/A 05/23/2013   Procedure: INTRAOPERATIVE TRANSESOPHAGEAL ECHOCARDIOGRAM;  Surgeon: Rexene Alberts, MD;  Location: [place];  Service: Open Heart Surgery;  Laterality: N/A;  . LAPAROSCOPIC CHOLECYSTECTOMY  2003  . LEFT AND RIGHT HEART CATHETERIZATION WITH CORONARY ANGIOGRAM N/A 03/22/2013   Procedure: LEFT AND RIGHT HEART CATHETERIZATION WITH CORONARY ANGIOGRAM;  Surgeon: Burnell Blanks, MD;  Location: [facility] CATH LAB;  Service: Cardiovascular;  Laterality: N/A;  . MINIMALLY INVASIVE TRICUSPID VALVE REPAIR Right 05/23/2013   Procedure: MINIMALLY INVASIVE TRICUSPID VALVE REPAIR;  Surgeon: Rexene Alberts, MD;  Location: [place];  Service: Open Heart Surgery;  Laterality: Right;  . MITRAL VALVE REPLACEMENT N/A 05/23/2013   Procedure: MITRAL VALVE (MV) REPLACEMENT;  Surgeon: Rexene Alberts, MD;  Location: [place];  Service: Open Heart Surgery;  Laterality: N/A;  . MULTIPLE EXTRACTIONS WITH ALVEOLOPLASTY N/A 04/04/2013   Procedure: Extraction of tooth #'s 1,8,9,13,14,15,23,24,25,26 with alveoloplasty and gross debridement of remaining teeth;  Surgeon: Lenn Cal, DDS;  Location: [place];  Service: Oral Surgery;  Laterality: N/A;  . TEE WITHOUT CARDIOVERSION N/A 03/18/2013   Procedure: TRANSESOPHAGEAL ECHOCARDIOGRAM (TEE);  Surgeon: Larey Dresser, MD;  Location: [place];  Service: Cardiovascular;  Laterality: N/A;  . TEE WITHOUT CARDIOVERSION N/A 06/17/2013   Procedure: TRANSESOPHAGEAL ECHOCARDIOGRAM (TEE);  Surgeon: Lelon Perla, MD;  Location: [facility] ENDOSCOPY;  Service: Cardiovascular;  Laterality: N/A;  . TUBAL LIGATION  1999    Soc Hx - never married. She has 3 children, 1 grandchild. She live in her own apartment. Her family is near by. She has not  qualified for disability - finances are a problem.    reports that she quit smoking about 7 years ago. Her smoking use included cigarettes. She has a 18.00 pack-year smoking history. She has never used smokeless tobacco. She reports current alcohol use. She reports that she does not use drugs.  Allergies  Allergen Reactions  . Oxycodone Itching  . Tape Rash and Other (See Comments)    THE ONLY TAPE THAT IS TOLERATED IS PAPER TAPE. EKG leads inflame the skin    Family History  Problem Relation Age of Onset  . Stroke Father   . Cancer Father   . Sarcoidosis Sister   . Heart attack Neg Hx   . Colon cancer Neg Hx   . Stomach cancer Neg Hx   . Pancreatic cancer Neg Hx   . Esophageal cancer Neg Hx      Prior to Admission medications   Medication Sig Start Date End Date Taking? Authorizing Provider  acetaminophen (TYLENOL) 500 MG tablet Take 1,000 mg by mouth every 6 (six) hours as needed for headache.    [provider]  aspirin EC 81 MG tablet Take 1 tablet (81 mg total) by mouth daily. 08/26/13   Aundra Dubin,  Elby Showers, MD  folic acid (FOLVITE) 1 MG tablet Take 1 mg by mouth daily.    [provider]  furosemide (LASIX) 40 MG tablet Take 1 tablet (40 mg total) by mouth daily. 05/06/20   Jerline Pain, MD  gabapentin (NEURONTIN) 100 MG capsule Take 1 capsule (100 mg total) by mouth 2 (two) times daily. And continue 300 mg at bedtime Patient not taking: Reported on 08/14/2020 06/26/20   Fulp, Ander Gaster, MD  gabapentin (NEURONTIN) 300 MG capsule Take 1 capsule (300 mg total) by mouth at bedtime. As needed for nerve pain Patient taking differently: Take 300 mg by mouth in the morning, at noon, and at bedtime.  06/05/20   Fulp, Cammie, MD  Hyprom-Naphaz-Polysorb-Zn Sulf (CLEAR EYES COMPLETE OP) Place 2 drops into both eyes daily as needed (red eyes).    [provider]  pantoprazole (PROTONIX) 40 MG tablet Take 1 tablet (40 mg total) by mouth daily. 05/05/20 08/14/21  [place]  Donnamarie Poag, DO  potassium chloride SA (KLOR-CON) 20 MEQ tablet Take 1 tablet (20 mEq total) by mouth daily. 05/06/20   Jerline Pain, MD  vitamin B-12 (CYANOCOBALAMIN) 1000 MCG tablet Take 1 tablet (1,000 mcg total) by mouth daily. 06/28/20   Fulp, Cammie, MD  Vitamin D, Ergocalciferol, (DRISDOL) 1.25 MG (50000 UNIT) CAPS capsule Take 1 capsule (50,000 Units total) by mouth every 7 (seven) days. Patient taking differently: Take 50,000 Units by mouth every Tuesday.  06/07/20   Gildardo Pounds, NP  warfarin (COUMADIN) 5 MG tablet Take 1 - 1/2 tablet daily or as directed by Coumadin clinic. 07/28/20   Evans Lance, MD    Physical Exam: Vitals:   08/17/20 2010 08/18/20 0128 08/18/20 0230 08/18/20 0400  BP: 108/79 130/84 (!) 145/82 139/82  Pulse: (!) 113 (!) 105 95 (!) 112  Resp: 18 18 18  (!) 22  SpO2: 100% 100% 97% 100%  Height:         Vitals:   08/17/20 2010 08/18/20 0128 08/18/20 0230 08/18/20 0400  BP: 108/79 130/84 (!) 145/82 139/82  Pulse: (!) 113 (!) 105 95 (!) 112  Resp: 18 18 18  (!) 22  SpO2: 100% 100% 97% 100%  Height:       General: WNWD woman looking older than chronologic age. She is in no acute distress Eyes: PERRL, lids and conjunctivae normal ENMT: Mucous membranes are dry. Poor dentition missing many teeth.  Neck: normal, supple, no masses, no thyromegaly Respiratory: clear to auscultation bilaterally, no wheezing, no crackles. Normal respiratory effort. No accessory muscle use.  Cardiovascular: Regular rate and rhythm with frequent PVCs, loud mechanical murmur LSB, mechanical mm Apex. No rubs, no  gallops. No extremity edema. 1+ pedal pulses. No carotid bruits.  Abdomen: no tenderness, no masses palpated. No hepatosplenomegaly. Bowel sounds positive.  Musculoskeletal: no clubbing / cyanosis.  joint deformity left hand with subluxation 1st MCP. lower extremities nl. Good ROM, no contractures. Decreased muscle tone.  Skin: no rashes, lesions, ulcers. No  induration Neurologic: CN 2-12 grossly intact: EOMI, PERRLA, nl facial movement w/o asymmetry, nl shoulder shrug. MS - 4/5 grip strength, 4/5 UE strength, 2/5 proximal LE strenght - barely lifts legs off bed and cannot hold up when passively assisted. DTR 2+ biceps, 2+ patellar. Sensation - nl to light touch.   Psychiatric: Normal judgment and insight. Alert and oriented x 3. Normal mood.     Labs on Admission: I have personally reviewed following labs and imaging studies  CBC: Recent  Labs  Lab 08/14/20 2147 08/17/20 1605 08/18/20 0004  WBC  --  5.4  --   NEUTROABS  --  3.7  --   HGB 10.2* 9.4* 9.2*  HCT 30.0* 27.0* 27.0*  MCV  --  126.8*  --   PLT  --  368  --    Basic Metabolic Panel: Recent Labs  Lab 08/14/20 2147 08/17/20 1605 08/18/20 0004  NA 133* 134* 135  K 4.6 5.0 3.9  CL 103 104  --   CO2  --  16*  --   GLUCOSE 66* 89  --   BUN 14 16  --   CREATININE 1.20* 1.37*  --   CALCIUM  --  8.7*  --    GFR: CrCl cannot be calculated (Unknown ideal weight.). Liver Function Tests: Recent Labs  Lab 08/17/20 1605  AST 92*  ALT 47*  ALKPHOS 114  BILITOT 1.3*  PROT 5.8*  ALBUMIN 3.0*   No results for input(s): LIPASE, AMYLASE in the last 168 hours. Recent Labs  Lab 08/18/20 0131  AMMONIA 49*   Coagulation Profile: Recent Labs  Lab 08/18/20 0131  INR >10.0*   Cardiac Enzymes: No results for input(s): CKTOTAL, CKMB, CKMBINDEX, TROPONINI in the last 168 hours. BNP (last 3 results) No results for input(s): PROBNP in the last 8760 hours. HbA1C: No results for input(s): HGBA1C in the last 72 hours. CBG: No results for input(s): GLUCAP in the last 168 hours. Lipid Profile: No results for input(s): CHOL, HDL, LDLCALC, TRIG, CHOLHDL, LDLDIRECT in the last 72 hours. Thyroid Function Tests: No results for input(s): TSH, T4TOTAL, FREET4, T3FREE, THYROIDAB in the last 72 hours. Anemia Panel: No results for input(s): VITAMINB12, FOLATE, FERRITIN, TIBC, IRON,  RETICCTPCT in the last 72 hours. Urine analysis:    Component Value Date/Time   COLORURINE YELLOW 04/24/2020 1307   APPEARANCEUR CLEAR 04/24/2020 1307   LABSPEC 1.034 (H) 04/24/2020 1307   PHURINE 7.0 04/24/2020 1307   GLUCOSEU NEGATIVE 04/24/2020 1307   HGBUR MODERATE (A) 04/24/2020 1307   BILIRUBINUR NEGATIVE 04/24/2020 1307   KETONESUR NEGATIVE 04/24/2020 1307   PROTEINUR NEGATIVE 04/24/2020 1307   UROBILINOGEN 0.2 06/04/2015 1533   NITRITE NEGATIVE 04/24/2020 1307   LEUKOCYTESUR TRACE (A) 04/24/2020 1307    Radiological Exams on Admission: MR BRAIN WO CONTRAST  Result Date: 08/18/2020 CLINICAL DATA:  Dizziness EXAM: MRI HEAD WITHOUT CONTRAST TECHNIQUE: Multiplanar, multiecho pulse sequences of the brain and surrounding structures were obtained without intravenous contrast. COMPARISON:  None. FINDINGS: Brain: No acute infarct, acute hemorrhage or extra-axial collection. Normal white matter signal. Normal volume of CSF spaces. Fewer than 5 scattered microhemorrhages in a nonspecific pattern. Normal midline structures. Vascular: Major flow voids are preserved. Skull and upper cervical spine: Normal calvarium and skull base. Visualized upper cervical spine and soft tissues are normal. Sinuses/Orbits:No paranasal sinus fluid levels or advanced mucosal thickening. No mastoid or middle ear effusion. Normal orbits. IMPRESSION: 1. No acute intracranial abnormality. 2. Fewer than 5 scattered microhemorrhages in a nonspecific pattern. Electronically Signed   By: Ulyses Jarred M.D.   On: 08/18/2020 01:22    EKG: Independently reviewed. Sinus Tachycardia, PVC's no acute changes or changes from previous EKGs  Assessment/Plan Active Problems:   Acute kidney injury (HCC)   (HFpEF) heart failure with preserved ejection fraction (HCC)   Elevated liver enzymes   GERD (gastroesophageal reflux disease)  (please populate well all problems here in Problem List. (For example, if patient is on BP meds at  home and you resume or decide to hold them, it is a problem that needs to be her. Same for CAD, COPD, HLD and so on)   1. AKI- prerenal azotemia. Patient admits poor PO intake. Plan  IVF  F/u Bmet  2. Neuro - patient with progressive paresthesia with a non-focal neuro exam. Previous recent  B12 levels normal, nl thyroid function. MRI brain w/o acute changes except for scattered punctate hemorrhages. Plan Consider neuro consult if patient remains symptomatic  Pt/OT eval  3. Heart failure - no sign decompensation. Continue home regimen.  4. GERD continue PPI    DVT prophylaxis: currently hyper-coagulated with INR >10  Code Status: full code  Family Communication: did not call elderly mother due to the hour  Disposition Plan: home when stable  Consults called: none - EDP did speak with on duty neurologist. (with names) Admission status: obs    Adella Hare MD [facility] Pager [phone]  If 7PM-7AM, please contact night-coverage [email or web address] Password [facility]  08/18/2020, 4:47 AM

## 2020-08-18 NOTE — ED Provider Notes (Signed)
[facility] EMERGENCY DEPARTMENT Provider Note   CSN: [id number] Arrival date & time: 08/17/20  1524     History Chief Complaint  Patient presents with  . Numbness    Mary [name] is a 59 y.o. female.  59 year old female who presents to the emergency department today secondary to multiple symptoms.  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  Patient states that she is increased her dose significantly over the last week and has started noticing significant abnormalities such as vision changes, weakness, nausea, numbness in her mouth and drooling, difficulty swallowing and worsening neuropathy.  It sounds like she went from 100 mg twice a day up to possibly 9 to 1200 mg a day during this time.  She is also noticed some mild dehydration.  No other associated symptoms.        Past Medical History:  Diagnosis Date  . Acute diastolic heart failure ([place]) 03/15/2013  . Anemia   . B12 deficiency   . Carotid stenosis    Carotid US 2/17: bilateral ICA 1-39% >> FU prn  . Childhood asthma   . Complete heart block ([place])   . Epigastric hernia 200's  . GERD (gastroesophageal reflux disease)   . Heart murmur   . History of blood transfusion    "14 w/1st pregnancy; 2 w/last C-section" (03/15/2013)  . Hx of echocardiogram    a. Echo 4/16:  Mild focal basal septal hypertrophy, EF 60-65%, no RWMA, Mechanical MVR ok with mild central regurgitation and no perivalvular leak, small mobile density attached to valvular ring (1.5x1 cm) - post surgical changes vs SBE, mild LAE;   b. Echo 2/17:  Mild post wall LVH, EF 55-60%, no RWMA, mechanical MVR ok (mean 5 mmHg), normal RVSF  . Hypertension    no pcp   will go to [facility]  . Macrocytic anemia   . 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 S/P mitral valve replacement with metallic valve 2/94/7654   6mm Sorin Carbomedics mechanical prosthesis via right mini thoracotomy approach  . S/P tricuspid valve  repair 05/23/2013   Complex valvuloplasty including Cor-matrix ECM patch augmentation of anterior and lateral leaflets with 31mm Edwards mc3 ring annuloplasty via right mini-thoracotomy approach  . Severe mitral regurgitation 04/22/2013  . Sinus tachycardia   . SVT (supraventricular tachycardia) ([place]) 03/16/2013  . TIA (transient ischemic attack)   . Tricuspid regurgitation     Patient Active Problem List   Diagnosis Date Noted  . Folic acid deficiency 65/11/5463  . H/O mitral valve replacement   . GERD (gastroesophageal reflux disease)   . Lactic acid acidosis   . Marijuana abuse   . Macrocytic anemia 04/04/2019  . Intermittent complete heart block ([place])   . Syncope 03/28/2019  . Volume depletion 05/24/2018  . Acute kidney injury ([place]) 05/23/2018  . Orthostatic hypotension 01/11/2017  . Chronic hyponatremia 01/11/2017  . Elevated liver enzymes 01/11/2017  . Atypical chest pain 06/05/2016  . Palpitations 03/20/2016  . Sinus tachycardia 02/01/2016  . AKI (acute kidney injury) ([place]) 01/10/2016  . Subtherapeutic international normalized ratio (INR) 01/10/2016  . TIA (transient ischemic attack) 09/13/2015  . History of rheumatic heart disease 09/13/2015  . CVA (cerebral vascular accident) ([place]) 09/12/2015  . SOB (shortness of breath) 02/24/2014  . First degree heart block 06/17/2013  . Weakness 06/16/2013  . (HFpEF) heart failure with preserved ejection fraction ([place]) 06/16/2013  . Anemia 06/16/2013  . Thrombocytosis 06/16/2013  . History of bacterial  endocarditis 06/16/2013  . Heart valve replaced by other means 06/10/2013  . Warfarin anticoagulation 06/10/2013  . History of complete heart block 05/28/2013  . S/P mitral valve replacement with metallic valve 07/37/1062  . S/P minimally-invasive tricuspid valve repair 05/23/2013  . Femoral hernia 05/23/2013  . Mitral valve insufficiency 04/22/2013  . Mitral valve disorders(424.0) 04/22/2013  . Tricuspid regurgitation 04/22/2013   . 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 SVT (supraventricular tachycardia) ([place]) 03/16/2013    Past Surgical History:  Procedure Laterality Date  . APPENDECTOMY  [id number]  . BIOPSY  05/01/2020   Procedure: BIOPSY;  Surgeon: Milus Banister, MD;  Location: [facility];  Service: Endoscopy;;  . CARDIAC CATHETERIZATION    . CARDIAC VALVE REPLACEMENT    . CESAREAN SECTION  1983  . CESAREAN SECTION WITH BILATERAL TUBAL LIGATION  1999  . COLONOSCOPY WITH PROPOFOL N/A 05/01/2020   Procedure: COLONOSCOPY WITH PROPOFOL;  Surgeon: Milus Banister, MD;  Location: [facility] ENDOSCOPY;  Service: Endoscopy;  Laterality: N/A;  . ESOPHAGOGASTRODUODENOSCOPY (EGD) WITH PROPOFOL N/A 05/01/2020   Procedure: ESOPHAGOGASTRODUODENOSCOPY (EGD) WITH PROPOFOL;  Surgeon: Milus Banister, MD;  Location[facility] ENDOSCOPY;  Service: Endoscopy;  Laterality: N/A;  . FEMORAL HERNIA REPAIR Right 05/23/2013   Procedure: HERNIA REPAIR FEMORAL;  Surgeon: Rexene Alberts, MD;  Location: [facility];  Service: Open Heart Surgery;  Laterality: Right;  . INTRAOPERATIVE TRANSESOPHAGEAL ECHOCARDIOGRAM N/A 05/23/2013   Procedure: INTRAOPERATIVE TRANSESOPHAGEAL ECHOCARDIOGRAM;  Surgeon: Rexene Alberts, MD;  Location: [facility];  Service: Open Heart Surgery;  Laterality: N/A;  . LAPAROSCOPIC CHOLECYSTECTOMY  2003  . LEFT AND RIGHT HEART CATHETERIZATION WITH CORONARY ANGIOGRAM N/A 03/22/2013   Procedure: LEFT AND RIGHT HEART CATHETERIZATION WITH CORONARY ANGIOGRAM;  Surgeon: Burnell Blanks, MD;  Location: [facility] CATH LAB;  Service: Cardiovascular;  Laterality: N/A;  . MINIMALLY INVASIVE TRICUSPID VALVE REPAIR Right 05/23/2013   Procedure: MINIMALLY INVASIVE TRICUSPID VALVE REPAIR;  Surgeon: Rexene Alberts, MD;  Location: [facility];  Service: Open Heart Surgery;  Laterality: Right;  . MITRAL VALVE REPLACEMENT N/A 05/23/2013   Procedure: MITRAL VALVE (MV) REPLACEMENT;  Surgeon: Rexene Alberts, MD;  Location: [facility];  Service: Open Heart  Surgery;  Laterality: N/A;  . MULTIPLE EXTRACTIONS WITH ALVEOLOPLASTY N/A 04/04/2013   Procedure: Extraction of tooth #'s 1,8,9,13,14,15,23,24,25,26 with alveoloplasty and gross debridement of remaining teeth;  Surgeon: Lenn Cal, DDS;  Location: [facility];  Service: Oral Surgery;  Laterality: N/A;  . TEE WITHOUT CARDIOVERSION N/A 03/18/2013   Procedure: TRANSESOPHAGEAL ECHOCARDIOGRAM (TEE);  Surgeon: Larey Dresser, MD;  Location: [facility];  Service: Cardiovascular;  Laterality: N/A;  . TEE WITHOUT CARDIOVERSION N/A 06/17/2013   Procedure: TRANSESOPHAGEAL ECHOCARDIOGRAM (TEE);  Surgeon: Lelon Perla, MD;  Location[facility] END[facility]COPY;  Service: Cardiovascular;  Laterality: N/A;  . TUBAL LIGATION  1999     OB History   No obstetric history on file.     Family History  Problem Relation Age of Onset  . Stroke Father   . Cancer Father   . Sarcoidosis Sister   . Heart attack Neg Hx   . Colon cancer Neg Hx   . Stomach cancer Neg Hx   . Pancreatic cancer Neg Hx   . Esophageal cancer Neg Hx     Social History   Tobacco Use  . Smoking status: Former Smoker    Packs/day: 0.50    Years: 36.00    Pack years: 18.00    Types: Cigarettes  Quit date: 03/15/2013    Years since quitting: 7.4  . Smokeless tobacco: Never Used  Vaping Use  . Vaping Use: Never used  Substance Use Topics  . Alcohol use: Yes    Comment:  "glass of wine"  . Drug use: No    Home Medications Prior to Admission medications   Medication Sig Start Date End Date Taking? Authorizing Provider  acetaminophen (TYLENOL) 500 MG tablet Take 1,000 mg by mouth every 6 (six) hours as needed for headache.    [provider]  aspirin EC 81 MG tablet Take 1 tablet (81 mg total) by mouth daily. 08/26/13   Larey Dresser, MD  folic acid (FOLVITE) 1 MG tablet Take 1 mg by mouth daily.    [provider]  furosemide (LASIX) 40 MG tablet Take 1 tablet (40 mg total) by mouth daily. 05/06/20   Jerline Pain, MD  gabapentin (NEURONTIN) 100 MG capsule Take 1 capsule (100 mg total) by mouth 2 (two) times daily. And continue 300 mg at bedtime Patient not taking: Reported on 08/14/2020 06/26/20   Fulp, Ander Gaster, MD  gabapentin (NEURONTIN) 300 MG capsule Take 1 capsule (300 mg total) by mouth at bedtime. As needed for nerve pain Patient taking differently: Take 300 mg by mouth in the morning, at noon, and at bedtime.  06/05/20   Fulp, Cammie, MD  Hyprom-Naphaz-Polysorb-Zn Sulf (CLEAR EYES COMPLETE OP) Place 2 drops into both eyes daily as needed (red eyes).    [provider]  pantoprazole (PROTONIX) 40 MG tablet Take 1 tablet (40 mg total) by mouth daily. 05/05/20 08/14/21  [place] Donnamarie Poag, DO  potassium chloride SA (KLOR-CON) 20 MEQ tablet Take 1 tablet (20 mEq total) by mouth daily. 05/06/20   Jerline Pain, MD  vitamin B-12 (CYANOCOBALAMIN) 1000 MCG tablet Take 1 tablet (1,000 mcg total) by mouth daily. 06/28/20   Fulp, Cammie, MD  Vitamin D, Ergocalciferol, (DRISDOL) 1.25 MG (50000 UNIT) CAPS capsule Take 1 capsule (50,000 Units total) by mouth every 7 (seven) days. Patient taking differently: Take 50,000 Units by mouth every Tuesday.  06/07/20   Gildardo Pounds, NP  warfarin (COUMADIN) 5 MG tablet Take 1 - 1/2 tablet daily or as directed by Coumadin clinic. 07/28/20   Evans Lance, MD    Allergies    Oxycodone and Tape  Review of Systems   Review of Systems  All other systems reviewed and are negative.   Physical Exam Updated Vital Signs BP 139/82   Pulse (!) 112   Resp (!) 22   Ht 5\' 2"  (1.575 m)   LMP 03/22/2013   SpO2 100%   BMI 20.49 kg/m   Physical Exam Vitals and nursing note reviewed.  Constitutional:      Appearance: She is well-developed.  HENT:     Head: Normocephalic and atraumatic.     Nose: No congestion or rhinorrhea.     Mouth/Throat:     Mouth: Mucous membranes are moist.  Eyes:     Pupils: Pupils are equal, round, and reactive to light.   Cardiovascular:     Rate and Rhythm: Normal rate and regular rhythm.  Pulmonary:     Effort: No respiratory distress.     Breath sounds: No stridor.  Abdominal:     General: Abdomen is flat. There is no distension.  Musculoskeletal:        General: Tenderness (bilateral lower extremities associated with neuropathy) present. No swelling. Normal range of motion.  Cervical back: Normal range of motion.  Skin:    General: Skin is warm and dry.  Neurological:     General: No focal deficit present.     Mental Status: She is alert and oriented to person, place, and time.     Comments: Symmetric weakness in BLE Difficulty swallowing her secretions Dysarthria     ED Results / Procedures / Treatments   Labs (all labs ordered are listed, but only abnormal results are displayed) Labs Reviewed  CBC WITH DIFFERENTIAL/PLATELET - Abnormal; Notable for the following components:      Result Value   RBC 2.13 (*)    Hemoglobin 9.4 (*)    HCT 27.0 (*)    MCV 126.8 (*)    MCH 44.1 (*)    RDW 16.0 (*)    nRBC 0.4 (*)    All other components within normal limits  COMPREHENSIVE METABOLIC PANEL - Abnormal; Notable for the following components:   Sodium 134 (*)    CO2 16 (*)    Creatinine, Ser 1.37 (*)    Calcium 8.7 (*)    Total Protein 5.8 (*)    Albumin 3.0 (*)    AST 92 (*)    ALT 47 (*)    Total Bilirubin 1.3 (*)    GFR, Estimated 45 (*)    All other components within normal limits  PROTIME-INR - Abnormal; Notable for the following components:   Prothrombin Time >90.0 (*)    INR >10.0 (*)    All other components within normal limits  AMMONIA - Abnormal; Notable for the following components:   Ammonia 49 (*)    All other components within normal limits  I-STAT ARTERIAL BLOOD GAS, ED - Abnormal; Notable for the following components:   pH, Arterial 7.489 (*)    pCO2 arterial 22.4 (*)    Bicarbonate 17.1 (*)    TCO2 18 (*)    Acid-base deficit 5.0 (*)    HCT 27.0 (*)     Hemoglobin 9.2 (*)    All other components within normal limits  BRAIN NATRIURETIC PEPTIDE  CALCIUM, IONIZED  BLOOD GAS, ARTERIAL    EKG EKG Interpretation  Date/Time:  Monday August 17 2020 23:22:08 EST Ventricular Rate:  115 PR Interval:    QRS Duration: 87 QT Interval:  340 QTC Calculation: 471 R Axis:   25 Text Interpretation: Sinus tachycardia Multiple premature complexes, vent & supraven Consider right atrial enlargement Low voltage, precordial leads No significant change since last tracing Confirmed by Merrily Pew [phone]) on 08/18/2020 12:25:22 AM   Radiology MR BRAIN WO CONTRAST  Result Date: 08/18/2020 CLINICAL DATA:  Dizziness EXAM: MRI HEAD WITHOUT CONTRAST TECHNIQUE: Multiplanar, multiecho pulse sequences of the brain and surrounding structures were obtained without intravenous contrast. COMPARISON:  None. FINDINGS: Brain: No acute infarct, acute hemorrhage or extra-axial collection. Normal white matter signal. Normal volume of CSF spaces. Fewer than 5 scattered microhemorrhages in a nonspecific pattern. Normal midline structures. Vascular: Major flow voids are preserved. Skull and upper cervical spine: Normal calvarium and skull base. Visualized upper cervical spine and soft tissues are normal. Sinuses/Orbits:No paranasal sinus fluid levels or advanced mucosal thickening. No mastoid or middle ear effusion. Normal orbits. IMPRESSION: 1. No acute intracranial abnormality. 2. Fewer than 5 scattered microhemorrhages in a nonspecific pattern. Electronically Signed   By: Ulyses Jarred M.D.   On: 08/18/2020 01:22    Procedures .Critical Care Performed by: Merrily Pew, MD Authorized by: Merrily Pew, MD   Critical care  provider statement:    Critical care time (minutes):  45   Critical care was necessary to treat or prevent imminent or life-threatening deterioration of the following conditions:  Endocrine crisis, metabolic crisis, dehydration and toxidrome   Critical  care was time spent personally by me on the following activities:  Discussions with consultants, evaluation of patient's response to treatment, examination of patient, ordering and performing treatments and interventions, ordering and review of laboratory studies, ordering and review of radiographic studies, pulse oximetry, re-evaluation of patient's condition, obtaining history from patient or surrogate and review of old charts   (including critical care time)  Medications Ordered in ED Medications  LORazepam (ATIVAN) tablet 1 mg (has no administration in time range)  LORazepam (ATIVAN) injection 1 mg (has no administration in time range)  lactated ringers bolus 1,000 mL (0 mLs Intravenous Stopped 08/18/20 0317)    ED Course  I have reviewed the triage vital signs and the nursing notes.  Pertinent labs & imaging results that were available during my care of the patient were reviewed by me and considered in my medical decision making (see chart for details).    MDM Rules/Calculators/A&P                          MRI negative for any acute ischemic events.  Suspect she likely has Neurontin toxicity and probably needs to stop it and that it be washed out especially in association with her kidney injury.  Fluids ordered here.  Discussed with neurology and did not feel official neurologic consultation was at this time that she did not improve after the Neurontin washed out.  INR is also elevated along with her liver enzymes.  Not sure what this is related to however she is on Coumadin because of her mitral valve so we will not reverse it now she has no evidence of acute bleeding.  Discussed with the hospitalist, Dr. Crissie Sickles, who will admit for same. Final Clinical Impression(s) / ED Diagnoses Final diagnoses:  Paresthesia  Numbness  Weakness  Supratherapeutic INR  Drug toxicity    Rx / DC Orders ED Discharge Orders    None       Dishawn Bhargava, Corene Cornea, MD 08/18/20 [phone]

## 2020-08-18 NOTE — ED Notes (Addendum)
Notified main RX of urgent Phytonadione due at [place], med not available at this time.

## 2020-08-18 NOTE — Progress Notes (Signed)
Pt has an INR of 10, will defer until more stable as she is a high fall risk.   08/18/20 1000  PT Visit Information  Reason Eval/Treat Not Completed Medical issues which prohibited therapy    Mee Hives, PT MS Acute Rehab Dept. Number: [place] and [place]

## 2020-08-19 ENCOUNTER — Inpatient Hospital Stay (HOSPITAL_COMMUNITY): Payer: Medicaid Other

## 2020-08-19 ENCOUNTER — Encounter (HOSPITAL_COMMUNITY): Payer: Self-pay | Admitting: Internal Medicine

## 2020-08-19 DIAGNOSIS — R531 Weakness: Secondary | ICD-10-CM

## 2020-08-19 DIAGNOSIS — G9340 Encephalopathy, unspecified: Secondary | ICD-10-CM

## 2020-08-19 LAB — CSF CELL COUNT WITH DIFFERENTIAL
RBC Count, CSF: 0 /mm3
RBC Count, CSF: 0 /mm3
Tube #: 3
Tube #: 1
WBC, CSF: 1 /mm3 (ref 0–5)
WBC, CSF: 1 /mm3 (ref 0–5)

## 2020-08-19 LAB — GLUCOSE, CSF: Glucose, CSF: 64 mg/dL (ref 40–70)

## 2020-08-19 LAB — HEAVY METALS, BLOOD
Arsenic: 2 ug/L (ref 2–23)
Lead: 3 ug/dL (ref 0–4)
Mercury: 2.4 ug/L (ref 0.0–14.9)

## 2020-08-19 LAB — PROTIME-INR
INR: 1.4 — ABNORMAL HIGH (ref 0.8–1.2)
Prothrombin Time: 16.1 seconds — ABNORMAL HIGH (ref 11.4–15.2)

## 2020-08-19 LAB — CBC
HCT: 20.6 % — ABNORMAL LOW (ref 36.0–46.0)
Hemoglobin: 7.3 g/dL — ABNORMAL LOW (ref 12.0–15.0)
MCH: 43.5 pg — ABNORMAL HIGH (ref 26.0–34.0)
MCHC: 35.4 g/dL (ref 30.0–36.0)
MCV: 122.6 fL — ABNORMAL HIGH (ref 80.0–100.0)
Platelets: 318 10*3/uL (ref 150–400)
RBC: 1.68 MIL/uL — ABNORMAL LOW (ref 3.87–5.11)
RDW: 15.9 % — ABNORMAL HIGH (ref 11.5–15.5)
WBC: 6.3 10*3/uL (ref 4.0–10.5)
nRBC: 0.3 % — ABNORMAL HIGH (ref 0.0–0.2)

## 2020-08-19 LAB — PROTEIN AND GLUCOSE, CSF
Glucose, CSF: 64 mg/dL (ref 40–70)
Total  Protein, CSF: 16 mg/dL (ref 15–45)

## 2020-08-19 MED ORDER — THIAMINE HCL 100 MG/ML IJ SOLN
250.0000 mg | Freq: Every day | INTRAVENOUS | Status: DC
  Administered 2020-08-21 – 2020-08-24 (×4): 250 mg via INTRAVENOUS
  Filled 2020-08-19 (×4): qty 2.5

## 2020-08-19 MED ORDER — THIAMINE HCL 100 MG/ML IJ SOLN
500.0000 mg | Freq: Three times a day (TID) | INTRAVENOUS | Status: AC
  Administered 2020-08-19 – 2020-08-21 (×6): 500 mg via INTRAVENOUS
  Filled 2020-08-19 (×7): qty 5

## 2020-08-19 MED ORDER — HEPARIN (PORCINE) 25000 UT/250ML-% IV SOLN
900.0000 [IU]/h | INTRAVENOUS | Status: DC
  Administered 2020-08-19: 800 [IU]/h via INTRAVENOUS
  Administered 2020-08-21 – 2020-08-23 (×3): 900 [IU]/h via INTRAVENOUS
  Filled 2020-08-19 (×5): qty 250

## 2020-08-19 MED ORDER — LIDOCAINE HCL (PF) 1 % IJ SOLN
INTRAMUSCULAR | Status: AC
  Filled 2020-08-19: qty 5

## 2020-08-19 MED ORDER — VITAMIN B-12 100 MCG PO TABS
100.0000 ug | ORAL_TABLET | Freq: Every day | ORAL | Status: DC
  Administered 2020-08-19 – 2020-08-24 (×6): 100 ug via ORAL
  Filled 2020-08-19 (×6): qty 1

## 2020-08-19 MED ORDER — THIAMINE HCL 100 MG/ML IJ SOLN: 100.0000 mg | Freq: Every day | INTRAMUSCULAR | Status: DC

## 2020-08-19 NOTE — Procedures (Signed)
Indication:  Risks of the procedure were dicussed with the patient including unable to obtain CSF, post-LP headache, bleeding, infection, weakness/numbness of legs(radiculopathy).  The patient/patient's proxy agreed and written consent was obtained.  The patient was prepped and draped, and using sterile technique a 20 gauge quinke spinal needle was inserted in the L3-4 space. Bedside LP was unsuccessful. No CSF was obtained.   [place] Pager Number [phone]

## 2020-08-19 NOTE — Evaluation (Signed)
Physical Therapy Evaluation Patient Details Name: Mary [name] MRN: [id number] DOB: Jun 19, 1961 Today's Date: 08/19/2020   History of Present Illness  Pt is a 59 y/o female admitted secondary to weakness and dizziness. Also reporting increased parethesias in face and all over body. MRI showed multiple punctuate hemorrhages. PMH includes HTN, s/p MVR, and CHF.   Clinical Impression  Pt admitted secondary to problem above with deficits below. Pt requiring min to mod A to stand and ambulate to bathroom and back. Pt with uncoordinated steps and reports numbness throughout. Pt lives alone and has 2 flights of steps. Feel she would benefit from SNF level therapies prior to d/c. Will continue to follow acutely.     Follow Up Recommendations SNF    Equipment Recommendations  Rolling walker with 5" wheels    Recommendations for Other Services       Precautions / Restrictions Precautions Precautions: Fall Restrictions Weight Bearing Restrictions: No      Mobility  Bed Mobility Overal bed mobility: Needs Assistance Bed Mobility: Supine to Sit;Sit to Supine     Supine to sit: Min assist Sit to supine: Min assist   General bed mobility comments: Required assist for LEs to come to sitting.     Transfers Overall transfer level: Needs assistance Equipment used: Rolling walker (2 wheeled) Transfers: Sit to/from Stand Sit to Stand: Mod assist;Min assist         General transfer comment: Min to mod A for lift assist and steadying.   Ambulation/Gait Ambulation/Gait assistance: Min assist;Mod assist Gait Distance (Feet): 15 Feet Assistive device: Rolling walker (2 wheeled) Gait Pattern/deviations: Step-through pattern;Decreased stride length Gait velocity: Decreased   General Gait Details: Decreased coordination when taking steps. Cues for appropriate use of RW. Min to mod A for steadying.   Stairs            Wheelchair Mobility    Modified Rankin (Stroke Patients  Only)       Balance Overall balance assessment: Needs assistance Sitting-balance support: No upper extremity supported;Feet supported Sitting balance-Leahy Scale: Good     Standing balance support: Bilateral upper extremity supported;During functional activity Standing balance-Leahy Scale: Poor Standing balance comment: Reliant on BUE support and external support                              Pertinent Vitals/Pain Pain Assessment: Faces Faces Pain Scale: Hurts a little bit Pain Location: "all over" Pain Descriptors / Indicators: Numbness;Tingling Pain Intervention(s): Monitored during session;Limited activity within patient's tolerance;Repositioned    Home Living Family/patient expects to be discharged to:: Private residence Living Arrangements: Alone Available Help at Discharge: Family;Available PRN/intermittently Type of Home: Apartment Home Access: Stairs to enter Entrance Stairs-Rails: Psychiatric nurse of Steps: 2 flights Home Layout: One level Home Equipment: Cane - single point      Prior Function Level of Independence: Independent with assistive device(s)         Comments: has been using cane      Hand Dominance        Extremity/Trunk Assessment   Upper Extremity Assessment Upper Extremity Assessment: Defer to OT evaluation    Lower Extremity Assessment Lower Extremity Assessment: Generalized weakness;RLE deficits/detail;LLE deficits/detail RLE Deficits / Details: Reports numbness and tingling in BLE  LLE Deficits / Details: Reports numbness and tingling in BLE        Communication   Communication: No difficulties  Cognition Arousal/Alertness: Awake/alert Behavior During Therapy: [facility]  for tasks assessed/performed Overall Cognitive Status: Within Functional Limits for tasks assessed                                        General Comments General comments (skin integrity, edema, etc.): Pt's daughter  present     Exercises     Assessment/Plan    PT Assessment Patient needs continued PT services  PT Problem List Decreased strength;Decreased balance;Decreased mobility;Impaired sensation;Decreased knowledge of precautions;Decreased knowledge of use of DME       PT Treatment Interventions DME instruction;Functional mobility training;Stair training;Gait training;Therapeutic activities;Therapeutic exercise;Cognitive remediation    PT Goals (Current goals can be found in the Care Plan section)  Acute Rehab PT Goals Patient Stated Goal: to be able to walk better before going home PT Goal Formulation: With patient Time For Goal Achievement: 09/02/20 Potential to Achieve Goals: Good    Frequency Min 2X/week   Barriers to discharge Decreased caregiver support;Inaccessible home environment      Co-evaluation               AM-PAC PT "6 Clicks" Mobility  Outcome Measure Help needed turning from your back to your side while in a flat bed without using bedrails?: A Little Help needed moving from lying on your back to sitting on the side of a flat bed without using bedrails?: A Little Help needed moving to and from a bed to a chair (including a wheelchair)?: A Lot Help needed standing up from a chair using your arms (e.g., wheelchair or bedside chair)?: A Lot Help needed to walk in hospital room?: A Lot Help needed climbing 3-5 steps with a railing? : Total 6 Click Score: 13    End of Session Equipment Utilized During Treatment: Gait belt Activity Tolerance: Patient tolerated treatment well Patient left: in bed;with call bell/phone within reach;with family/visitor present;Other (comment) (with lab staff present ) Nurse Communication: Mobility status PT Visit Diagnosis: Unsteadiness on feet (R26.81);Muscle weakness (generalized) (M62.81)    Time: 5701-7793 PT Time Calculation (min) (ACUTE ONLY): 15 min   Charges:   PT Evaluation $PT Eval Moderate Complexity: 1 Mod           Reuel Derby, PT, DPT  Acute Rehabilitation Services  Pager: [phone] Office: [phone]   Rudean Hitt 08/19/2020, 12:50 PM

## 2020-08-19 NOTE — Progress Notes (Signed)
Neurology Progress Note  S: "my legs feel stronger today". She reports her legs are still "wobbly" but not as bad as yesterday. Pt states she still has numbness in feet, legs, hands and head. Continues to endorse pain with touching of skin. She states her drooling is same as yesterday. States she has been eating but only small amounts. The decreased eating is more due to mouth pain. The Magic Mouthwash with Lidocaine helps the pain but only for about an hour. She is taking her pills with applesauce. She states that her vision is still blurry at distances over about 3 ft from face. She has no color vision loss. Per RN, pt was able to stand pivot to [facility] with only one person assistance. Pt was pleased with that.   Today, her daughter is here from [place]. Per daughter, the patient has no one to "look after her here" and supervise her eating and ETOH consumption. Daughter states that the pt does drink, but does not know the exact quantity. Daughter states that when she heard about her mom's illness, she immediately though of ETOH. Daughter states she looked the symptoms up and ETOH was a source. However, she states her mom seems to be reverting back to being a baby and that is totally different from usual behavior. Daughter states the family plans to have the pt move back to [place].    O: Current vital signs: BP 126/72 (BP Location: Left Arm)   Pulse 98   Temp 98 F (36.7 C) (Oral)   Resp 18   Ht '5\' 2"'  (1.575 m)   Wt 51.3 kg   LMP 03/22/2013   SpO2 95%   BMI 20.69 kg/m  Vital signs in last 24 hours: Temp:  [97.5 F (36.4 C)-98.7 F (37.1 C)] 98 F (36.7 C) (11/24 0946) Pulse Rate:  [98-114] 98 (11/24 0946) Resp:  [16-20] 18 (11/24 0946) BP: (107-149)/(72-90) 126/72 (11/24 0946) SpO2:  [91 %-100 %] 95 % (11/24 0946) FiO2 (%):  [16 %] 16 % (11/23 1735) Weight:  [51.3 kg] 51.3 kg (11/23 1800) GENERAL: Awake, alert in NAD HEENT: Normocephalic and atraumatic, dry mm, no LN++ LUNGS: Normal respiratory  effort. Unlabored breathing.  CV:RRR ABDOMEN: Soft Ext: warm, well perfused NEURO:  Mental Status: AA&Ox3  Language: speech is intact and appropriate.  Naming, repetition, fluency, and comprehension intact. Cranial Nerves:  II: EOMI, visual fields full III, IV, VI: PERRL 4 mm/brisk. No nystagmus with EOMI. V: sensation is intact but less on the left. Able to clinch teeth.   VII: no facial asymmetry VIII: Hearing is intact to voice and finger rubbing.  IX, X: uvula and soft pallate rise symmetrically.  XI-head is midline. Able to turn head against resistance. Shoulder shrug 5/5.  XII-tongue protrudes. No atrophy or fasciculations.  Motor: 3-5/5 strength throughout. Able to lift BLE off bed to a higher degree than yesterday.  Tone: is decreased. normal and bulk is normal Sensation- decreased to left side of body to light tough. + to vibration, but decreased on left.  DTRs-1 biceps, 2 brachioradialis and triceps, 1 patellar and achilles.  Coordination: FTN intact bilaterally, no ataxia in BLE. Gait- deferred    Medications  Current Facility-Administered Medications:  .  0.45 % sodium chloride infusion, , Intravenous, Continuous, Norins, Heinz Knuckles, MD, Last Rate: 75 mL/hr at 08/19/20 0538, New Bag at 08/19/20 0538 .  acetaminophen (TYLENOL) tablet 650 mg, 650 mg, Oral, Q6H PRN, 650 mg at 08/19/20 0548 **OR** acetaminophen (TYLENOL) suppository 650  mg, 650 mg, Rectal, Q6H PRN, Norins, Heinz Knuckles, MD .  aspirin EC tablet 81 mg, 81 mg, Oral, Daily, Norins, Heinz Knuckles, MD, 81 mg at 08/19/20 2725 .  furosemide (LASIX) tablet 40 mg, 40 mg, Oral, Daily, Norins, Heinz Knuckles, MD, 40 mg at 08/18/20 1206 .  gabapentin (NEURONTIN) capsule 100 mg, 100 mg, Oral, BID, Norins, Heinz Knuckles, MD, 100 mg at 08/19/20 0938 .  gabapentin (NEURONTIN) capsule 300 mg, 300 mg, Oral, QHS, Norins, Heinz Knuckles, MD, 300 mg at 08/18/20 2143 .  heparin ADULT infusion 100 units/mL (25000 units/259m sodium chloride 0.45%), 800  Units/hr, Intravenous, Continuous, LAmedeo Plenty RNorth Kendrick.  LORazepam (ATIVAN) tablet 1 mg, 1 mg, Oral, Q4H PRN, Mesner, JCorene Cornea MD .  magic mouthwash w/lidocaine, 10 mL, Oral, QID, Kirby-Graham, KKarsten Fells NP, 10 mL at 08/19/20 0938 .  pantoprazole (PROTONIX) EC tablet 40 mg, 40 mg, Oral, Daily, Norins, MHeinz Knuckles MD, 40 mg at 08/19/20 0938 .  potassium chloride SA (KLOR-CON) CR tablet 20 mEq, 20 mEq, Oral, Daily, Norins, MHeinz Knuckles MD, 20 mEq at 08/18/20 1205 .  thiamine 5046min normal saline (5016mIVPB, 500 mg, Intravenous, Q8H **FOLLOWED BY** [START ON 08/21/2020] thiamine (B-1) 250 mg in sodium chloride 0.9 % 50 mL IVPB, 250 mg, Intravenous, Daily **FOLLOWED BY** [START ON 08/27/2020] thiamine (B-1) injection 100 mg, 100 mg, Intravenous, Daily, KhaDonnetta SimpersD Labs CBC    Component Value Date/Time   WBC 6.3 08/19/2020 0430   RBC 1.68 (L) 08/19/2020 0430   HGB 7.3 (L) 08/19/2020 0430   HGB 8.6 (L) 07/03/2020 1152   HCT 20.6 (L) 08/19/2020 0430   HCT 25.0 (L) 07/03/2020 1152   PLT 318 08/19/2020 0430   PLT 445 06/26/2020 1218   MCV 122.6 (H) 08/19/2020 0430   MCV 109 (H) 06/26/2020 1218   MCH 43.5 (H) 08/19/2020 0430   MCHC 35.4 08/19/2020 0430   RDW 15.9 (H) 08/19/2020 0430   RDW 26.5 (H) 06/26/2020 1218   LYMPHSABS 1.3 08/17/2020 1605   LYMPHSABS 1.9 06/26/2020 1218   MONOABS 0.3 08/17/2020 1605   EOSABS 0.1 08/17/2020 1605   EOSABS 0.2 06/26/2020 1218   BASOSABS 0.0 08/17/2020 1605   BASOSABS 0.0 06/26/2020 1218    CMP     Component Value Date/Time   NA 137 08/18/2020 0810   NA 139 07/03/2020 1152   K 3.5 08/18/2020 0810   CL 105 08/18/2020 0810   CO2 21 (L) 08/18/2020 0810   GLUCOSE 93 08/18/2020 0810   BUN 13 08/18/2020 0810   BUN 7 07/03/2020 1152   CREATININE 1.21 (H) 08/18/2020 0810   CREATININE 0.82 10/21/2015 0925   CALCIUM 8.6 (L) 08/18/2020 0810   PROT 5.8 (L) 08/17/2020 1605   PROT 6.1 12/11/2019 1141   ALBUMIN 3.0 (L) 08/17/2020 1605   ALBUMIN  3.0 (L) 12/11/2019 1141   AST 92 (H) 08/17/2020 1605   ALT 47 (H) 08/17/2020 1605   ALKPHOS 114 08/17/2020 1605   BILITOT 1.3 (H) 08/17/2020 1605   BILITOT 0.5 12/11/2019 1141   GFRNONAA 52 (L) 08/18/2020 0810   GFRNONAA 36 (L) 04/11/2013 1237   GFRAA 92 07/03/2020 1152   GFRAA 41 (L) 04/11/2013 1237    glycosylated hemoglobin  Lipid Panel     Component Value Date/Time   CHOL 189 05/31/2018 1108   TRIG 300 (H) 05/31/2018 1108   HDL 68 05/31/2018 1108   CHOLHDL 2.8 05/31/2018 1108   CHOLHDL 3.2 06/06/2016 0135   VLDL  22 06/06/2016 0135   LDLCALC 61 05/31/2018 1108   No new images since yesterday.   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 She presented to the ED with a hx of neuropathy x 6 mos but with progressive numbness and weakness moving proximately over 2 weeks. Her CT head showed atrophy, MRI showed same + fewer than 5 scattered microhemorhhages in a non specific pattern which was new since MRI 2018. Labs were ordered and some are back and we are awaiting others. CK, ESR, B12, Folate, HbA1c, ETOH were normal. Thoughts were given to nutrition as a cause, ETOH as a cause, and autoimmune disease. Autoimmune disease markers thus far normal helps eliminate that prospect but will await other labs. Other labs are pending. ? GBS considered and will do an LP today.   Impression:  Progressive numbness and weakness over months, worsening greatly over past 2 weeks. Differential is broad given her significant hx of  ETOH use including EtOH induced myopathy/neuropathy, autoimmune disease, and/or nutritional deficiencies given poor PO intake. Also considering GBS given the acute ascending numbness and now BL lower extremity weakness.  Recommendations:  - I  ordered high dose thiamine replacement. - Continue PO Multivitamin - Recommend Vitamin B12 po 1076m daily. - LP today attempted at bedside but unfortunately patient declined a second attempt at the bedside.  - Patient to get LP under IR, hopefully today. - Await other labs.  Other plan per TRH.    SWhite LakePager Number [phone]

## 2020-08-19 NOTE — Procedures (Signed)
Under flouro guidance, lumbar puncture performed at L3-4. No immediate complication.  Sabino Dick, MD

## 2020-08-19 NOTE — Progress Notes (Addendum)
Addendum: I discussed the importance of heparin gtt while INR is subtherapeutic and pt agrees to begin therapy. Will initiate heparin gtt at 800units/hr without bolus, with goal 0.3-0.5 for recent concern of GIB and few microhemorrhages seen on MRI.  -Follow up with 8-hr heparin level and plans for restarting warfarin -Closely monitor Hgb and s/sx bleeding  Mercy Riding, PharmD PGY1 Acute Care Pharmacy Resident Please refer to [facility] for unit-specific pharmacist   [place] for Heparin (when INR is <2.5) Indication: Mechanical MVR  Allergies  Allergen Reactions  . Oxycodone Itching  . Tape Rash and Other (See Comments)    THE ONLY TAPE THAT IS TOLERATED IS PAPER TAPE. EKG leads inflame the skin    Patient Measurements: Height: 5\' 2"  (157.5 cm) Weight: 51.3 kg (113 lb 1.5 oz) IBW/kg (Calculated) : 50.1  Vital Signs: Temp: 98.7 F (37.1 C) (11/24 0539) Temp Source: Oral (11/24 0539) BP: 127/80 (11/24 0539) Pulse Rate: 100 (11/24 0539)  Labs: Recent Labs    08/17/20 1605 08/17/20 1605 08/18/20 0004 08/18/20 0131 08/18/20 0810 08/18/20 1635 08/18/20 2152 08/19/20 0430  HGB 9.4*   < > 9.2*  --   --   --   --  7.3*  HCT 27.0*  --  27.0*  --   --   --   --  20.6*  PLT 368  --   --   --   --   --   --  318  LABPROT  --   --   --  >90.0*  --   --  16.4* 16.1*  INR  --   --   --  >10.0*  --   --  1.4* 1.4*  CREATININE 1.37*  --   --   --  1.21*  --   --   --   CKTOTAL  --   --   --   --   --  39  --   --    < > = values in this interval not displayed.    Estimated Creatinine Clearance: 40.6 mL/min (A) (by C-G formula based on SCr of 1.21 mg/dL (H)).   Medical History: Past Medical History:  Diagnosis Date  . Acute diastolic heart failure ([place]) 03/15/2013  . Anemia   . B12 deficiency   . Carotid stenosis    Carotid US 2/17: bilateral ICA 1-39% >> FU prn  . Childhood asthma   . Complete heart block ([place])    . Epigastric hernia 200's  . GERD (gastroesophageal reflux disease)   . Heart murmur   . History of blood transfusion    "14 w/1st pregnancy; 2 w/last C-section" (03/15/2013)  . Hx of echocardiogram    a. Echo 4/16:  Mild focal basal septal hypertrophy, EF 60-65%, no RWMA, Mechanical MVR ok with mild central regurgitation and no perivalvular leak, small mobile density attached to valvular ring (1.5x1 cm) - post surgical changes vs SBE, mild LAE;   b. Echo 2/17:  Mild post wall LVH, EF 55-60%, no RWMA, mechanical MVR ok (mean 5 mmHg), normal RVSF  . Hypertension    no pcp   will go to [facility]  . Macrocytic anemia   . 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 S/P mitral valve replacement with metallic valve 8/93/7342   78mm Sorin Carbomedics mechanical prosthesis via right mini thoracotomy approach  . 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 Severe mitral regurgitation 04/22/2013  . Sinus tachycardia   . SVT (supraventricular tachycardia) ([place]) 03/16/2013  . TIA (transient ischemic attack)   . Tricuspid regurgitation    Assessment: Consulted to begin heparin in a 59 y/o F whose warfarin was held due to INR of >10 and reversed with Vit K. INR is now 1.4 tonight, will begin heparin. Initially thought to be having GIB, but GI has evaluated pt and states no GI issues currently and they have signed off.   Goal of Therapy:  Heparin level 0.3-0.5 units/ml Monitor platelets by anticoagulation protocol: Yes   Plan:  Start heparin drip at 850 units/hr  0800 heparin level  Daily CBC/HL Monitor for bleeding Should be able to resume warfarin tomorrow   Narda Bonds, PharmD, BCPS Clinical Pharmacist Phone: [phone]  ============================================= Addendum  RN called, pt refusing heparin. I  discussed with the patient the importance of the heparin while her INR is low-pt continues to refuse. RN states she is making MD aware.  Narda Bonds, PharmD, BCPS Clinical Pharmacist Phone: [phone]

## 2020-08-19 NOTE — Progress Notes (Signed)
OT Cancellation Note  Patient Details Name: ANUOLUWAPO MEFFERD MRN: [id number] DOB: 1960/12/12   Cancelled Treatment:    Reason Eval/Treat Not Completed: (P) Patient at procedure or test/ unavailable  Kem Parcher,HILLARY 08/19/2020, 3:25 PM  Maurie Boettcher, OT/L   Acute OT Clinical Specialist Acute Rehabilitation Services Pager [phone] Office [phone]

## 2020-08-19 NOTE — Progress Notes (Signed)
PROGRESS NOTE    Mary [name]  MRN:[id number] DOB: 06/04/1961 DOA: 08/17/2020 PCP: Fulp, Cammie, MD  Brief Narrative: 59-year-old female with history of mechanical mitral valve and tricuspid repair in 2014 on chronic Coumadin, alcohol abuse, macrocytic anemia, chronic diastolic CHF, chronic erosive gastritis, pyloric stenosis, Called EMS due to multiple complaints namely numbness weakness dizziness etc. -Noted to have peripheral neuropathy and multiple other neurological symptoms -Also long history of alcoholism  Assessment & Plan:   Progressive neuropathy Myalgias -ESR and CRP are within normal limits -Chronic alcoholism suspected to be contributing, nutritional deficiencies also possible -Appreciate neurology input, plan for LP noted -MRI notes few microhemorrhages -Follow-up ANA, rheumatoid factor, ACE levels vitamin B6, SPEP and MMA -Continue high-dose thiamine -Taking very high doses of gabapentin which was contributing to dizziness -PT OT eval  History of mechanical mitral valve -Supratherapeutic INR on admission, reversed with vitamin K on admission, now subtherapeutic -IV heparin ordered but not was not restarted yet, will hold this pending LP  Macrocytic anemia -No overt bleeding at this time -B12 and folate within normal range now, also follow-up MMA -Check peripheral smear, LDH, reticulocyte count -Follow-up SPEP -EGD and colonoscopy in August noted pyloric stenosis with some narrowing, negative H. pylori, colonoscopy was negative  Chronic diastolic CHF -Clinically appears euvolemic, hold Lasix  Abnormal LFTs -Pattern consistent with alcoholic liver disease  Alcohol abuse -Counseled, not willing to accept this could be contributing to her symptoms, continue high-dose thiamine -Watch for withdrawal  DVT prophylaxis: SCDs Code Status: Full code Family Communication: No family at bedside Disposition Plan:  Status is: Inpatient  Remains inpatient  appropriate because:Inpatient level of care appropriate due to severity of illness   Dispo: The patient is from: Home              Anticipated d/c is to: SNF              Anticipated d/c date is: 3 days              Patient currently is not medically stable to d/c.  Consultants:   Neuro   Procedures: attempted LP  Antimicrobials:    Subjective: -Reports feeling a little better today, complains of pain tingling and numbness in both lower legs  Objective: Vitals:   08/18/20 2134 08/19/20 0131 08/19/20 0539 08/19/20 0946  BP: 124/75 118/72 127/80 126/72  Pulse: (!) 103 100 100 98  Resp: 20 20 20 18  Temp: 98.1 F (36.7 C) 97.8 F (36.6 C) 98.7 F (37.1 C) 98 F (36.7 C)  TempSrc: Oral Axillary Oral Oral  SpO2: 96% 100% 97% 95%  Weight:      Height:        Intake/Output Summary (Last 24 hours) at 08/19/2020 1339 Last data filed at 08/19/2020 0830 Gross per 24 hour  Intake 1516.71 ml  Output --  Net 1516.71 ml   Filed Weights   08/18/20 1800  Weight: 51.3 kg    Examination:  General exam: Pleasant middle-aged female, sitting up in bed, awake alert oriented to self and place HEENT: Missing front teeth, swollen gums CVS: S1-S2, regular rhythm, metallic click  Lungs: Clear bilaterally Abdomen: Soft, nontender, bowel sounds present Extremities: No edema Neuro: Decreased lower extremity strength, 4/5 throughout the right and left, decreased sensation, light touch and pinprick, depressed reflexes  Psych: Flat affect   Data Reviewed:   CBC: Recent Labs  Lab 08/14/20 2147 08/17/20 1605 08/18/20 0004 08/19/20 0430  WBC  --  5.4  --    PROGRESS NOTE    Mary [name]  MRN:[id number] DOB: 02/23/1961 DOA: 08/17/2020 PCP: Fulp, Cammie, MD  Brief Narrative: 59-year-old female with history of mechanical mitral valve and tricuspid repair in 2014 on chronic Coumadin, alcohol abuse, macrocytic anemia, chronic diastolic CHF, chronic erosive gastritis, pyloric stenosis, Called EMS due to multiple complaints namely numbness weakness dizziness etc. -Noted to have peripheral neuropathy and multiple other neurological symptoms -Also long history of alcoholism  Assessment & Plan:   Progressive neuropathy Myalgias -ESR and CRP are within normal limits -Chronic alcoholism suspected to be contributing, nutritional deficiencies also possible -Appreciate neurology input, plan for LP noted -MRI notes few microhemorrhages -Follow-up ANA, rheumatoid factor, ACE levels vitamin B6, SPEP and MMA -Continue high-dose thiamine -Taking very high doses of gabapentin which was contributing to dizziness -PT OT eval  History of mechanical mitral valve -Supratherapeutic INR on admission, reversed with vitamin K on admission, now subtherapeutic -IV heparin ordered but not was not restarted yet, will hold this pending LP  Macrocytic anemia -No overt bleeding at this time -B12 and folate within normal range now, also follow-up MMA -Check peripheral smear, LDH, reticulocyte count -Follow-up SPEP -EGD and colonoscopy in August noted pyloric stenosis with some narrowing, negative H. pylori, colonoscopy was negative  Chronic diastolic CHF -Clinically appears euvolemic, hold Lasix  Abnormal LFTs -Pattern consistent with alcoholic liver disease  Alcohol abuse -Counseled, not willing to accept this could be contributing to her symptoms, continue high-dose thiamine -Watch for withdrawal  DVT prophylaxis: SCDs Code Status: Full code Family Communication: No family at bedside Disposition Plan:  Status is: Inpatient  Remains inpatient  appropriate because:Inpatient level of care appropriate due to severity of illness   Dispo: The patient is from: Home              Anticipated d/c is to: SNF              Anticipated d/c date is: 3 days              Patient currently is not medically stable to d/c.  Consultants:   Neuro   Procedures: attempted LP  Antimicrobials:    Subjective: -Reports feeling a little better today, complains of pain tingling and numbness in both lower legs  Objective: Vitals:   08/18/20 2134 08/19/20 0131 08/19/20 0539 08/19/20 0946  BP: 124/75 118/72 127/80 126/72  Pulse: (!) 103 100 100 98  Resp: 20 20 20 18  Temp: 98.1 F (36.7 C) 97.8 F (36.6 C) 98.7 F (37.1 C) 98 F (36.7 C)  TempSrc: Oral Axillary Oral Oral  SpO2: 96% 100% 97% 95%  Weight:      Height:        Intake/Output Summary (Last 24 hours) at 08/19/2020 1339 Last data filed at 08/19/2020 0830 Gross per 24 hour  Intake 1516.71 ml  Output --  Net 1516.71 ml   Filed Weights   08/18/20 1800  Weight: 51.3 kg    Examination:  General exam: Pleasant middle-aged female, sitting up in bed, awake alert oriented to self and place HEENT: Missing front teeth, swollen gums CVS: S1-S2, regular rhythm, metallic click  Lungs: Clear bilaterally Abdomen: Soft, nontender, bowel sounds present Extremities: No edema Neuro: Decreased lower extremity strength, 4/5 throughout the right and left, decreased sensation, light touch and pinprick, depressed reflexes  Psych: Flat affect   Data Reviewed:   CBC: Recent Labs  Lab 08/14/20 2147 08/17/20 1605 08/18/20 0004 08/19/20 0430  WBC  --  5.4  --    PROGRESS NOTE    Mary [name]  MRN:[id number] DOB: 02/23/1961 DOA: 08/17/2020 PCP: Fulp, Cammie, MD  Brief Narrative: 59-year-old female with history of mechanical mitral valve and tricuspid repair in 2014 on chronic Coumadin, alcohol abuse, macrocytic anemia, chronic diastolic CHF, chronic erosive gastritis, pyloric stenosis, Called EMS due to multiple complaints namely numbness weakness dizziness etc. -Noted to have peripheral neuropathy and multiple other neurological symptoms -Also long history of alcoholism  Assessment & Plan:   Progressive neuropathy Myalgias -ESR and CRP are within normal limits -Chronic alcoholism suspected to be contributing, nutritional deficiencies also possible -Appreciate neurology input, plan for LP noted -MRI notes few microhemorrhages -Follow-up ANA, rheumatoid factor, ACE levels vitamin B6, SPEP and MMA -Continue high-dose thiamine -Taking very high doses of gabapentin which was contributing to dizziness -PT OT eval  History of mechanical mitral valve -Supratherapeutic INR on admission, reversed with vitamin K on admission, now subtherapeutic -IV heparin ordered but not was not restarted yet, will hold this pending LP  Macrocytic anemia -No overt bleeding at this time -B12 and folate within normal range now, also follow-up MMA -Check peripheral smear, LDH, reticulocyte count -Follow-up SPEP -EGD and colonoscopy in August noted pyloric stenosis with some narrowing, negative H. pylori, colonoscopy was negative  Chronic diastolic CHF -Clinically appears euvolemic, hold Lasix  Abnormal LFTs -Pattern consistent with alcoholic liver disease  Alcohol abuse -Counseled, not willing to accept this could be contributing to her symptoms, continue high-dose thiamine -Watch for withdrawal  DVT prophylaxis: SCDs Code Status: Full code Family Communication: No family at bedside Disposition Plan:  Status is: Inpatient  Remains inpatient  appropriate because:Inpatient level of care appropriate due to severity of illness   Dispo: The patient is from: Home              Anticipated d/c is to: SNF              Anticipated d/c date is: 3 days              Patient currently is not medically stable to d/c.  Consultants:   Neuro   Procedures: attempted LP  Antimicrobials:    Subjective: -Reports feeling a little better today, complains of pain tingling and numbness in both lower legs  Objective: Vitals:   08/18/20 2134 08/19/20 0131 08/19/20 0539 08/19/20 0946  BP: 124/75 118/72 127/80 126/72  Pulse: (!) 103 100 100 98  Resp: 20 20 20 18  Temp: 98.1 F (36.7 C) 97.8 F (36.6 C) 98.7 F (37.1 C) 98 F (36.7 C)  TempSrc: Oral Axillary Oral Oral  SpO2: 96% 100% 97% 95%  Weight:      Height:        Intake/Output Summary (Last 24 hours) at 08/19/2020 1339 Last data filed at 08/19/2020 0830 Gross per 24 hour  Intake 1516.71 ml  Output --  Net 1516.71 ml   Filed Weights   08/18/20 1800  Weight: 51.3 kg    Examination:  General exam: Pleasant middle-aged female, sitting up in bed, awake alert oriented to self and place HEENT: Missing front teeth, swollen gums CVS: S1-S2, regular rhythm, metallic click  Lungs: Clear bilaterally Abdomen: Soft, nontender, bowel sounds present Extremities: No edema Neuro: Decreased lower extremity strength, 4/5 throughout the right and left, decreased sensation, light touch and pinprick, depressed reflexes  Psych: Flat affect   Data Reviewed:   CBC: Recent Labs  Lab 08/14/20 2147 08/17/20 1605 08/18/20 0004 08/19/20 0430  WBC  --  5.4  --

## 2020-08-19 NOTE — Progress Notes (Signed)
Patient refusing IV heparin Dr. Myna Hidalgo  notified.

## 2020-08-19 NOTE — Progress Notes (Signed)
ANTICOAGULATION CONSULT NOTE - Initial Consult  Pharmacy Consult for Heparin (when INR is <2.5) Indication: Mechanical MVR  Allergies  Allergen Reactions  . Oxycodone Itching  . Tape Rash and Other (See Comments)    THE ONLY TAPE THAT IS TOLERATED IS PAPER TAPE. EKG leads inflame the skin    Patient Measurements: Height: 5\' 2"  (157.5 cm) Weight: 51.3 kg (113 lb 1.5 oz) IBW/kg (Calculated) : 50.1  Vital Signs: Temp: 98 F (36.7 C) (11/24 0946) Temp Source: Oral (11/24 0946) BP: 126/72 (11/24 0946) Pulse Rate: 98 (11/24 0946)  Labs: Recent Labs    08/17/20 1605 08/17/20 1605 08/18/20 0004 08/18/20 0131 08/18/20 0810 08/18/20 1635 08/18/20 2152 08/19/20 0430  HGB 9.4*   < > 9.2*  --   --   --   --  7.3*  HCT 27.0*  --  27.0*  --   --   --   --  20.6*  PLT 368  --   --   --   --   --   --  318  LABPROT  --   --   --  >90.0*  --   --  16.4* 16.1*  INR  --   --   --  >10.0*  --   --  1.4* 1.4*  CREATININE 1.37*  --   --   --  1.21*  --   --   --   CKTOTAL  --   --   --   --   --  39  --   --    < > = values in this interval not displayed.    Estimated Creatinine Clearance: 40.6 mL/min (A) (by C-G formula based on SCr of 1.21 mg/dL (H)).   Medical History: Past Medical History:  Diagnosis Date  . Acute diastolic heart failure ([place]) 03/15/2013  . Anemia   . B12 deficiency   . Carotid stenosis    Carotid US 2/17: bilateral ICA 1-39% >> FU prn  . Childhood asthma   . Complete heart block ([place])   . Epigastric hernia 200's  . GERD (gastroesophageal reflux disease)   . Heart murmur   . History of blood transfusion    "14 w/1st pregnancy; 2 w/last C-section" (03/15/2013)  . Hx of echocardiogram    a. Echo 4/16:  Mild focal basal septal hypertrophy, EF 60-65%, no RWMA, Mechanical MVR ok with mild central regurgitation and no perivalvular leak, small mobile density attached to valvular ring (1.5x1 cm) - post surgical changes vs SBE, mild LAE;   b. Echo 2/17:  Mild post  wall LVH, EF 55-60%, no RWMA, mechanical MVR ok (mean 5 mmHg), normal RVSF  . Hypertension    no pcp   will go to [facility]  . Macrocytic anemia   . 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 S/P mitral valve replacement with metallic valve 04/01/2375   37mm Sorin [employer]omed[employer] mechanical prosthesis via right mini thoracotomy approach  . 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 Severe mitral regurgitation 04/22/2013  . Sinus tachycardia   . SVT (supraventricular tachycardia) ([place]) 03/16/2013  . TIA (transient ischemic attack)   . Tricuspid regurgitation    Assessment: Consulted to begin heparin in a 59 y/o F whose warfarin was held due to INR of >10 and reversed with Vit K. INR is now 1.4 tonight,  will begin heparin. Initially thought to be having GIB, but GI has evaluated pt and states no GI issues currently and they have signed off.   Pt was supposed to be started on IV heparin this AM but it was held for LP. LP has been done. D/w with [place] about timing of IV heparin resume. Dr. Jacalyn Lefevre said ok to resume 1 hr post procedure per [facility] in [place]. We will start without bolus.   Goal of Therapy:  Heparin level 0.3-0.5 units/ml Monitor platelets by anticoagulation protocol: Yes   Plan:  Start heparin drip at 850 units/hr  8 hr heparin level  Daily CBC/HL Monitor for bleeding F/u resuming Coumadin  Onnie Boer, PharmD, BCIDP, AAHIVP, CPP Infectious Disease Pharmacist 08/19/2020 6:55 PM

## 2020-08-19 NOTE — Progress Notes (Signed)
   08/19/20 2000 08/19/20 2005  Assess: MEWS Score  Temp  --  98.2 F (36.8 C)  BP  --  131/79  Pulse Rate  --  (!) 120  Resp  --  18  Level of Consciousness Alert Alert  SpO2  --  100 %  O2 Device  --  Room Air  Assess: MEWS Score  MEWS Temp 0 0  MEWS Systolic 0 0  MEWS Pulse 0 2  MEWS RR 0 0  MEWS LOC 0 0  MEWS Score 0 2  MEWS Score Color Green Yellow  Assess: if the MEWS score is Yellow or Red  Were vital signs taken at a resting state?  --  Yes  Focused Assessment  --  Change from prior assessment (see assessment flowsheet)  MEWS guidelines implemented *See Row Information*  --  Yes  Treat  Pain Scale  --  0-10  Pain Score  --  0  Take Vital Signs  Increase Vital Sign Frequency   --  Yellow: Q 2hr X 2 then Q 4hr X 2, if remains yellow, continue Q 4hrs  Escalate  MEWS: Escalate  --  Yellow: discuss with charge nurse/RN and consider discussing with provider and RRT  Notify: Charge Nurse/RN  Name of Charge Nurse/RN Notified  --  Engineering geologist  Date Charge Nurse/RN Notified  --  08/19/20  Time Charge Nurse/RN Notified  --  2100  Notify: Provider  Provider Name/Title  --  Chotiner  Date Provider Notified  --  08/19/20  Time Provider Notified  --  2107  Notification Type  --  Page  Notification Reason  --  Change in status  Response  --  See new orders  Date of Provider Response  --  08/19/20  Time of Provider Response  --  2146  Notify: Rapid Response  Name of Rapid Response RN Notified  --  NA  Document  Patient Outcome  --  Stabilized after interventions  Progress note created (see row info)  --  Yes

## 2020-08-20 LAB — IRON AND TIBC
Iron: 103 ug/dL (ref 28–170)
Saturation Ratios: 68 % — ABNORMAL HIGH (ref 10.4–31.8)
TIBC: 151 ug/dL — ABNORMAL LOW (ref 250–450)
UIBC: 48 ug/dL

## 2020-08-20 LAB — RHEUMATOID FACTOR: Rheumatoid fact SerPl-aCnc: <10 IU/mL (ref <14.0)

## 2020-08-20 LAB — RETICULOCYTES
Immature Retic Fract: 33.6 % — ABNORMAL HIGH (ref 2.3–15.9)
RBC.: 1.51 MIL/uL — ABNORMAL LOW (ref 3.87–5.11)
Retic Count, Absolute: 52.2 10*3/uL (ref 19.0–186.0)
Retic Ct Pct: 3.5 % — ABNORMAL HIGH (ref 0.4–3.1)

## 2020-08-20 LAB — COMPREHENSIVE METABOLIC PANEL
ALT: 21 U/L (ref 0–44)
AST: 24 U/L (ref 15–41)
Albumin: 2.3 g/dL — ABNORMAL LOW (ref 3.5–5.0)
Alkaline Phosphatase: 70 U/L (ref 38–126)
Anion gap: 6 (ref 5–15)
BUN: 6 mg/dL (ref 6–20)
CO2: 22 mmol/L (ref 22–32)
Calcium: 8.1 mg/dL — ABNORMAL LOW (ref 8.9–10.3)
Chloride: 111 mmol/L (ref 98–111)
Creatinine, Ser: 0.73 mg/dL (ref 0.44–1.00)
GFR, Estimated: >60 mL/min (ref >60)
Glucose, Bld: 91 mg/dL (ref 70–99)
Potassium: 3.8 mmol/L (ref 3.5–5.1)
Sodium: 139 mmol/L (ref 135–145)
Total Bilirubin: 0.6 mg/dL (ref 0.3–1.2)
Total Protein: 4.9 g/dL — ABNORMAL LOW (ref 6.5–8.1)

## 2020-08-20 LAB — CBC
HCT: 18.4 % — ABNORMAL LOW (ref 36.0–46.0)
HCT: 24.6 % — ABNORMAL LOW (ref 36.0–46.0)
Hemoglobin: 6.6 g/dL — CL (ref 12.0–15.0)
Hemoglobin: 8.7 g/dL — ABNORMAL LOW (ref 12.0–15.0)
MCH: 44.3 pg — ABNORMAL HIGH (ref 26.0–34.0)
MCH: 39.4 pg — ABNORMAL HIGH (ref 26.0–34.0)
MCHC: 35.9 g/dL (ref 30.0–36.0)
MCHC: 35.4 g/dL (ref 30.0–36.0)
MCV: 123.5 fL — ABNORMAL HIGH (ref 80.0–100.0)
MCV: 111.3 fL — ABNORMAL HIGH (ref 80.0–100.0)
Platelets: 317 10*3/uL (ref 150–400)
Platelets: 295 10*3/uL (ref 150–400)
RBC: 1.49 MIL/uL — ABNORMAL LOW (ref 3.87–5.11)
RBC: 2.21 MIL/uL — ABNORMAL LOW (ref 3.87–5.11)
RDW: 16.2 % — ABNORMAL HIGH (ref 11.5–15.5)
WBC: 5.1 10*3/uL (ref 4.0–10.5)
WBC: 6.3 10*3/uL (ref 4.0–10.5)
nRBC: 0.4 % — ABNORMAL HIGH (ref 0.0–0.2)
nRBC: 0.3 % — ABNORMAL HIGH (ref 0.0–0.2)

## 2020-08-20 LAB — ANTIEXTRACTABLE NUCLEAR AG
ENA SM Ab Ser-aCnc: <0.2 AI (ref 0.0–0.9)
Ribonucleic Protein: 0.2 AI (ref 0.0–0.9)

## 2020-08-20 LAB — HEPARIN LEVEL (UNFRACTIONATED)
Heparin Unfractionated: 0.25 IU/mL — ABNORMAL LOW (ref 0.30–0.70)
Heparin Unfractionated: 0.29 IU/mL — ABNORMAL LOW (ref 0.30–0.70)

## 2020-08-20 LAB — PREPARE RBC (CROSSMATCH)

## 2020-08-20 LAB — LACTATE DEHYDROGENASE: LDH: 187 U/L (ref 98–192)

## 2020-08-20 LAB — FERRITIN: Ferritin: 826 ng/mL — ABNORMAL HIGH (ref 11–307)

## 2020-08-20 LAB — ANGIOTENSIN CONVERTING ENZYME: Angiotensin-Converting Enzyme: 29 U/L (ref 14–82)

## 2020-08-20 MED ORDER — SODIUM CHLORIDE 0.9% IV SOLUTION: Freq: Once | INTRAVENOUS | Status: AC

## 2020-08-20 MED ORDER — ACETAMINOPHEN 325 MG PO TABS
650.0000 mg | ORAL_TABLET | Freq: Once | ORAL | Status: AC
  Administered 2020-08-20: 650 mg via ORAL
  Filled 2020-08-20: qty 2

## 2020-08-20 MED ORDER — DIPHENHYDRAMINE HCL 25 MG PO CAPS
25.0000 mg | ORAL_CAPSULE | Freq: Once | ORAL | Status: AC
  Administered 2020-08-20: 25 mg via ORAL
  Filled 2020-08-20: qty 1

## 2020-08-20 NOTE — Progress Notes (Signed)
ANTICOAGULATION CONSULT NOTE - Follow Up Consult  Pharmacy Consult for heparin Indication: MVR  Labs: Recent Labs    08/17/20 1605 08/17/20 1605 08/18/20 0004 08/18/20 0004 08/18/20 0131 08/18/20 0810 08/18/20 1635 08/18/20 2152 08/19/20 0430 08/20/20 0437  HGB 9.4*   < > 9.2*   < >  --   --   --   --  7.3* 6.6*  HCT 27.0*   < > 27.0*  --   --   --   --   --  20.6* 18.4*  PLT 368  --   --   --   --   --   --   --  318 317  LABPROT  --   --   --   --  >90.0*  --   --  16.4* 16.1*  --   INR  --   --   --   --  >10.0*  --   --  1.4* 1.4*  --   HEPARINUNFRC  --   --   --   --   --   --   --   --   --  0.25*  CREATININE 1.37*  --   --   --   --  1.21*  --   --   --  0.73  CKTOTAL  --   --   --   --   --   --  39  --   --   --    < > = values in this interval not displayed.    Assessment: 59yo female subtherapeutic on heparin with initial dosing while Coumadin on hold; no gtt issues or signs of bleeding per RN but she does note that pt will be receiving transfusion for Hgb 6.6.  Goal of Therapy:  Heparin level 0.3-0.5 units/ml   Plan:  Will increase heparin gtt slightly to 850 units/hr and check level in 8 hours.    Wynona Neat, PharmD, BCPS  08/20/2020,5:44 AM

## 2020-08-20 NOTE — Progress Notes (Signed)
OT Cancellation Note  Patient Details Name: EMONIE ESPERICUETA MRN: [id number] DOB: Oct 31, 1960   Cancelled Treatment:    Reason Eval/Treat Not Completed: Medical issues which prohibited therapy. Patient with subtherapeutic heparin levels and low Hgb 6.6 waiting transfusion. Will check back 11/26 as schedule permits.  Delbert Phenix OT    Rosemary Holms 08/20/2020, 7:00 AM

## 2020-08-20 NOTE — Progress Notes (Signed)
PROGRESS NOTE    DOYLE TEGETHOFF  HMC:[id number] DOB: 05/30/1961 DOA: 08/17/2020 PCP: Antony Blackbird, MD  Brief Narrative: 59 year old female with history of mechanical mitral valve and tricuspid repair in 2014 on chronic Coumadin, alcohol abuse, macrocytic anemia, chronic diastolic CHF, chronic erosive gastritis, pyloric stenosis, Called EMS due to multiple complaints namely numbness weakness dizziness etc. -Noted to have peripheral neuropathy and multiple other neurological symptoms -Also long history of alcoholism  Assessment & Plan:   Progressive neuropathy Myalgias -ESR and CRP are within normal limits -Chronic alcoholism suspected to be contributing, nutritional deficiencies also possible -Appreciate neurology input, LP completed 11/24 in IR, CSF preliminary studies are all unremarkable -MRI notes few microhemorrhages -Rheumatoid factor, ANA normal, heavy metal screen negative, follow-up ACE levels, vitamin B6, SPEP and MMA -Continue high-dose thiamine -Taking very high doses of gabapentin which was contributing to dizziness -PT OT eval completed, SNF recommended for rehab -Neurology recommends follow-up with neuromuscular neurology as outpatient for EMG  History of mechanical mitral valve -Supratherapeutic INR on admission, reversed with vitamin K on admission, now subtherapeutic -Continue IV heparin, hold off on Coumadin at this time due to severe anemia  Acute on chronic macrocytic anemia -Alcoholism likely contributing, B12 and folate within normal range now, also follow-up MMA -Reticulocyte count is up, follow-up peripheral smear and SPEP -EGD and colonoscopy in August noted pyloric stenosis with some narrowing, negative H. pylori, colonoscopy was negative -No overt bleeding reported, will Hemoccult stools, transfuse 1 unit of PRBC, check iron stores and monitor  Chronic diastolic CHF -Clinically appears euvolemic, Lasix on hold  Abnormal LFTs -Pattern consistent with  alcoholic liver disease  Alcohol abuse -Counseled, not willing to accept this could be contributing to her symptoms, continue high-dose thiamine -Watch for withdrawal  DVT prophylaxis: SCDs Code Status: Full code Family Communication: No family at bedside Disposition Plan:  Status is: Inpatient  Remains inpatient appropriate because:Inpatient level of care appropriate due to severity of illness   Dispo: The patient is from: Home              Anticipated d/c is to: SNF              Anticipated d/c date is: 3 days              Patient currently is not medically stable to d/c.  Consultants:   Neuro   Procedures: Lumbar puncture 11/24  Antimicrobials:    Subjective: -Reports feeling a little better, continues to have pain, tingling numbness and weakness in her legs  Objective: Vitals:   08/20/20 0847 08/20/20 0927 08/20/20 1043 08/20/20 1058  BP: 113/77 120/76 114/72 109/68  Pulse: (!) 109 (!) 115 (!) 102 (!) 103  Resp: '20 18 16 16  ' Temp: 98.4 F (36.9 C) 98 F (36.7 C) 98.5 F (36.9 C) 98.9 F (37.2 C)  TempSrc: Oral Oral Oral Oral  SpO2: 97% 100% 99% 98%  Weight:      Height:        Intake/Output Summary (Last 24 hours) at 08/20/2020 1215 Last data filed at 08/20/2020 0600 Gross per 24 hour  Intake 936.29 ml  Output --  Net 936.29 ml   Filed Weights   08/18/20 1800  Weight: 51.3 kg    Examination:  General exam: Pleasant middle-aged female sitting up in bed, awake alert oriented to self and place HEENT: Missing front teeth, no JVD CVS: S1-S2, regular rhythm, metallic click Lungs: Clear bilaterally Abdomen: Soft, nontender, bowel sounds present Remedies: No  edema  Neuro: Decreased lower extremity strength, 4/5 throughout the right and left, decreased sensation, light touch and pinprick, depressed reflexes  Psych: Flat affect   Data Reviewed:   CBC: Recent Labs  Lab 08/14/20 2147 08/17/20 1605 08/18/20 0004 08/19/20 0430 08/20/20 0437   WBC  --  5.4  --  6.3 5.1  NEUTROABS  --  3.7  --   --   --   HGB 10.2* 9.4* 9.2* 7.3* 6.6*  HCT 30.0* 27.0* 27.0* 20.6* 18.4*  MCV  --  126.8*  --  122.6* 123.5*  PLT  --  368  --  318 782   Basic Metabolic Panel: Recent Labs  Lab 08/14/20 2147 08/17/20 1605 08/18/20 0004 08/18/20 0810 08/20/20 0437  NA 133* 134* 135 137 139  K 4.6 5.0 3.9 3.5 3.8  CL 103 104  --  105 111  CO2  --  16*  --  21* 22  GLUCOSE 66* 89  --  93 91  BUN 14 16  --  13 6  CREATININE 1.20* 1.37*  --  1.21* 0.73  CALCIUM  --  8.7*  --  8.6* 8.1*   GFR: Estimated Creatinine Clearance: 61.4 mL/min (by C-G formula based on SCr of 0.73 mg/dL). Liver Function Tests: Recent Labs  Lab 08/17/20 1605 08/20/20 0437  AST 92* 24  ALT 47* 21  ALKPHOS 114 70  BILITOT 1.3* 0.6  PROT 5.8* 4.9*  ALBUMIN 3.0* 2.3*   No results for input(s): LIPASE, AMYLASE in the last 168 hours. Recent Labs  Lab 08/18/20 0131  AMMONIA 49*   Coagulation Profile: Recent Labs  Lab 08/18/20 0131 08/18/20 2152 08/19/20 0430  INR >10.0* 1.4* 1.4*   Cardiac Enzymes: Recent Labs  Lab 08/18/20 1635  CKTOTAL 39   BNP (last 3 results) No results for input(s): PROBNP in the last 8760 hours. HbA1C: Recent Labs    08/18/20 1635  HGBA1C 4.2*   CBG: No results for input(s): GLUCAP in the last 168 hours. Lipid Profile: No results for input(s): CHOL, HDL, LDLCALC, TRIG, CHOLHDL, LDLDIRECT in the last 72 hours. Thyroid Function Tests: No results for input(s): TSH, T4TOTAL, FREET4, T3FREE, THYROIDAB in the last 72 hours. Anemia Panel: Recent Labs    08/18/20 1635 08/20/20 0437 08/20/20 0805  VITAMINB12 420  --   --   FOLATE 33.8  --   --   FERRITIN  --  826*  --   TIBC  --   --  151*  IRON  --   --  103  RETICCTPCT  --  3.5*  --    Urine analysis:    Component Value Date/Time   COLORURINE YELLOW 04/24/2020 1307   APPEARANCEUR CLEAR 04/24/2020 1307   LABSPEC 1.034 (H) 04/24/2020 1307   PHURINE 7.0  04/24/2020 1307   GLUCOSEU NEGATIVE 04/24/2020 1307   HGBUR MODERATE (A) 04/24/2020 1307   BILIRUBINUR NEGATIVE 04/24/2020 [place] 04/24/2020 1307   PROTEINUR NEGATIVE 04/24/2020 1307   UROBILINOGEN 0.2 06/04/2015 1533   NITRITE NEGATIVE 04/24/2020 1307   LEUKOCYTESUR TRACE (A) 04/24/2020 1307   Sepsis Labs: '@LABRCNTIP' (procalcitonin:4,lacticidven:4)  ) Recent Results (from the past 240 hour(s))  Respiratory Panel by RT PCR (Flu A&B, Covid) - Nasopharyngeal Swab     Status: None   Collection Time: 08/18/20  9:52 AM   Specimen: Nasopharyngeal Swab; Nasopharyngeal(NP) swabs in vial transport medium  Result Value Ref Range Status   SARS Coronavirus 2 by RT PCR NEGATIVE NEGATIVE  Final    Comment: (NOTE) SARS-CoV-2 target nucleic acids are NOT DETECTED.  The SARS-CoV-2 RNA is generally detectable in upper respiratoy specimens during the acute phase of infection. The lowest concentration of SARS-CoV-2 viral copies this assay can detect is 131 copies/mL. A negative result does not preclude SARS-Cov-2 infection and should not be used as the sole basis for treatment or other patient management decisions. A negative result may occur with  improper specimen collection/handling, submission of specimen other than nasopharyngeal swab, presence of viral mutation(s) within the areas targeted by this assay, and inadequate number of viral copies (<131 copies/mL). A negative result must be combined with clinical observations, patient history, and epidemiological information. The expected result is Negative.  Fact Sheet for Patients:  PinkCheek.be  Fact Sheet for Healthcare Providers:  GravelBags.it  This test is no t yet approved or cleared by the [place] FDA and  has been authorized for detection and/or diagnosis of SARS-CoV-2 by FDA under an Emergency Use Authorization (EUA). This EUA will remain  in effect  (meaning this test can be used) for the duration of the COVID-19 declaration under Section 564(b)(1) of the Act, 21 U.S.C. section 360bbb-3(b)(1), unless the authorization is terminated or revoked sooner.     Influenza A by PCR NEGATIVE NEGATIVE Final   Influenza B by PCR NEGATIVE NEGATIVE Final    Comment: (NOTE) The Xpert Xpress SARS-CoV-2/FLU/RSV assay is intended as an aid in  the diagnosis of influenza from Nasopharyngeal swab specimens and  should not be used as a sole basis for treatment. Nasal washings and  aspirates are unacceptable for Xpert Xpress SARS-CoV-2/FLU/RSV  testing.  Fact Sheet for Patients: PinkCheek.be  Fact Sheet for Healthcare Providers: GravelBags.it  This test is not yet approved or cleared by the [place] FDA and  has been authorized for detection and/or diagnosis of SARS-CoV-2 by  FDA under an Emergency Use Authorization (EUA). This EUA will remain  in effect (meaning this test can be used) for the duration of the  Covid-19 declaration under Section 564(b)(1) of the Act, 21  U.S.C. section 360bbb-3(b)(1), unless the authorization is  terminated or revoked. Performed at [facility], [place] [address], [place], [place] [address]   CSF culture     Status: None (Preliminary result)   Collection Time: 08/19/20  4:53 PM   Specimen: PATH Cytology CSF; Cerebrospinal Fluid  Result Value Ref Range Status   Specimen Description CSF  Final   Special Requests NONE  Final   Gram Stain   Final    WBC PRESENT, PREDOMINANTLY MONONUCLEAR NO ORGANISMS SEEN CYTOSPIN SMEAR    Culture   Final    NO GROWTH < 24 HOURS Performed at [facility], [place] [address], [place],  [address]    Report Status PENDING  Incomplete         Radiology Studies: DG FL GUIDED LUMBAR PUNCTURE  Result Date: 08/19/2020 CLINICAL DATA:  Cephalopathy. EXAM: DIAGNOSTIC LUMBAR PUNCTURE UNDER FLUOROSCOPIC  GUIDANCE FLUOROSCOPY TIME:  Radiation Exposure Index (if provided by the fluoroscopic device): 20.7 mGy. Number of Acquired Spot Images: 1. PROCEDURE: Informed consent was obtained from the patient prior to the procedure, including potential complications of headache, allergy, and pain. With the patient prone, the lower back was prepped with Betadine. 1% Lidocaine was used for local anesthesia. Lumbar puncture was performed at the L3-4 level using a 20 gauge needle with return of clear CSF with an opening pressure of 15 cm water. 12  ml of CSF were obtained for laboratory studies. The patient tolerated the procedure well and there were no apparent complications. IMPRESSION: Under fluoroscopic guidance, successful lumbar puncture at L3-4 level for diagnostic purposes. Electronically Signed   By: Marijo Conception M.D.   On: 08/19/2020 17:03   Scheduled Meds: . sodium chloride   Intravenous Once  . aspirin EC  81 mg Oral Daily  . gabapentin  100 mg Oral BID  . gabapentin  300 mg Oral QHS  . magic mouthwash w/lidocaine  10 mL Oral QID  . pantoprazole  40 mg Oral Daily  . potassium chloride SA  20 mEq Oral Daily  . [START ON 08/27/2020] thiamine injection  100 mg Intravenous Daily  . vitamin B-12  100 mcg Oral Daily   Continuous Infusions: . heparin 850 Units/hr (08/20/20 0600)  . thiamine injection 500 mg (08/20/20 0515)   Followed by  . [START ON 08/21/2020] thiamine injection       LOS: 2 days    Time spent: 31mn  PDomenic Polite MD [facility]  08/20/2020, 12:15 PM

## 2020-08-20 NOTE — Progress Notes (Addendum)
Neurology Progress Note  S:  Feels a little bit more stronger today. I discussed LP findings with her and that the preliminary studies are negative.  O: Current vital signs: BP 113/77 (BP Location: Right Arm)   Pulse (!) 109   Temp 98.4 F (36.9 C) (Oral)   Resp 20   Ht 5\' 2"  (1.575 m)   Wt 51.3 kg   LMP 03/22/2013   SpO2 97%   BMI 20.69 kg/m  Vital signs in last 24 hours: Temp:  [98 F (36.7 C)-98.7 F (37.1 C)] 98.4 F (36.9 C) (11/25 0847) Pulse Rate:  [98-120] 109 (11/25 0847) Resp:  [18-20] 20 (11/25 0847) BP: (107-133)/(68-83) 113/77 (11/25 0847) SpO2:  [95 %-100 %] 97 % (11/25 0847) GENERAL: Awake, alert in NAD HEENT: Normocephalic and atraumatic, dry mm, no LN++ LUNGS: Normal respiratory effort. Unlabored breathing.  CV:RRR ABDOMEN: Soft Ext: warm, well perfused NEURO:  Mental Status: AA&Ox3  Language: speech is intact and appropriate.  Naming, repetition, fluency, and comprehension intact. Cranial Nerves:  II: EOMI, visual fields full III, IV, VI: PERRL 4 mm/brisk. No nystagmus with EOMI. V: sensation is intact but less on the left. Able to clinch teeth.   VII: no facial asymmetry VIII: Hearing is intact to voice and finger rubbing.  IX, X: uvula and soft pallate rise symmetrically.  XI-head is midline. Able to turn head against resistance. Shoulder shrug 5/5.  XII-tongue protrudes. No atrophy or fasciculations.  Motor: 3-5/5 strength throughout. Able to lift BLE off bed. Tone: is normal. normal and bulk is normal Tenderness to palpation of her muscles. Sensation- decreased to left side of body to light touch. DTRs-1 biceps, 2 brachioradialis and triceps, 1 patellar and achilles.  Coordination: FTN intact bilaterally, no ataxia in BLE. Gait- deferred    Medications  Current Facility-Administered Medications:  .  0.9 %  sodium chloride infusion (Manually program via Guardrails IV Fluids), , Intravenous, Once, Chotiner, Yevonne Aline, MD .  acetaminophen  (TYLENOL) tablet 650 mg, 650 mg, Oral, Q6H PRN, 650 mg at 08/19/20 0548 **OR** acetaminophen (TYLENOL) suppository 650 mg, 650 mg, Rectal, Q6H PRN, Norins, Heinz Knuckles, MD .  aspirin EC tablet 81 mg, 81 mg, Oral, Daily, Norins, Heinz Knuckles, MD, 81 mg at 08/19/20 1505 .  gabapentin (NEURONTIN) capsule 100 mg, 100 mg, Oral, BID, Norins, Heinz Knuckles, MD, 100 mg at 08/19/20 1520 .  gabapentin (NEURONTIN) capsule 300 mg, 300 mg, Oral, QHS, Norins, Heinz Knuckles, MD, 300 mg at 08/19/20 2130 .  heparin ADULT infusion 100 units/mL (25000 units/241mL sodium chloride 0.45%), 850 Units/hr, Intravenous, Continuous, Bryk, Veronda P, RPH, Last Rate: 8.5 mL/hr at 08/20/20 0600, 850 Units/hr at 08/20/20 0600 .  LORazepam (ATIVAN) tablet 1 mg, 1 mg, Oral, Q4H PRN, Mesner, Corene Cornea, MD, 1 mg at 08/20/20 0043 .  magic mouthwash w/lidocaine, 10 mL, Oral, QID, Kirby-Graham, Karsten Fells, NP, 10 mL at 08/19/20 2146 .  pantoprazole (PROTONIX) EC tablet 40 mg, 40 mg, Oral, Daily, Norins, Heinz Knuckles, MD, 40 mg at 08/19/20 0938 .  potassium chloride SA (KLOR-CON) CR tablet 20 mEq, 20 mEq, Oral, Daily, Norins, Heinz Knuckles, MD, 20 mEq at 08/18/20 1205 .  thiamine 500mg  in normal saline (8ml) IVPB, 500 mg, Intravenous, Q8H, Last Rate: 25 mL/hr at 08/20/20 0515, 500 mg at 08/20/20 0515 **FOLLOWED BY** [START ON 08/21/2020] thiamine (B-1) 250 mg in sodium chloride 0.9 % 50 mL IVPB, 250 mg, Intravenous, Daily **FOLLOWED BY** [START ON 08/27/2020] thiamine (B-1) injection 100 mg, 100 mg,  Intravenous, Daily, Donnetta Simpers, MD .  vitamin B-12 (CYANOCOBALAMIN) tablet 100 mcg, 100 mcg, Oral, Daily, Gardiner Barefoot, NP, 100 mcg at 08/19/20 1520 Labs CBC    Component Value Date/Time   WBC 5.1 08/20/2020 0437   RBC 1.49 (L) 08/20/2020 0437   RBC 1.51 (L) 08/20/2020 0437   HGB 6.6 (LL) 08/20/2020 0437   HGB 8.6 (L) 07/03/2020 1152   HCT 18.4 (L) 08/20/2020 0437   HCT 25.0 (L) 07/03/2020 1152   PLT 317 08/20/2020 0437   PLT 445 06/26/2020 1218    MCV 123.5 (H) 08/20/2020 0437   MCV 109 (H) 06/26/2020 1218   MCH 44.3 (H) 08/20/2020 0437   MCHC 35.9 08/20/2020 0437   RDW 16.2 (H) 08/20/2020 0437   RDW 26.5 (H) 06/26/2020 1218   LYMPHSABS 1.3 08/17/2020 1605   LYMPHSABS 1.9 06/26/2020 1218   MONOABS 0.3 08/17/2020 1605   EOSABS 0.1 08/17/2020 1605   EOSABS 0.2 06/26/2020 1218   BASOSABS 0.0 08/17/2020 1605   BASOSABS 0.0 06/26/2020 1218    CMP     Component Value Date/Time   NA 139 08/20/2020 0437   NA 139 07/03/2020 1152   K 3.8 08/20/2020 0437   CL 111 08/20/2020 0437   CO2 22 08/20/2020 0437   GLUCOSE 91 08/20/2020 0437   BUN 6 08/20/2020 0437   BUN 7 07/03/2020 1152   CREATININE 0.73 08/20/2020 0437   CREATININE 0.82 10/21/2015 0925   CALCIUM 8.1 (L) 08/20/2020 0437   PROT 4.9 (L) 08/20/2020 0437   PROT 6.1 12/11/2019 1141   ALBUMIN 2.3 (L) 08/20/2020 0437   ALBUMIN 3.0 (L) 12/11/2019 1141   AST 24 08/20/2020 0437   ALT 21 08/20/2020 0437   ALKPHOS 70 08/20/2020 0437   BILITOT 0.6 08/20/2020 0437   BILITOT 0.5 12/11/2019 1141   GFRNONAA >60 08/20/2020 0437   GFRNONAA 36 (L) 04/11/2013 1237   GFRAA 92 07/03/2020 1152   GFRAA 41 (L) 04/11/2013 1237    glycosylated hemoglobin  Lipid Panel     Component Value Date/Time   CHOL 189 05/31/2018 1108   TRIG 300 (H) 05/31/2018 1108   HDL 68 05/31/2018 1108   CHOLHDL 2.8 05/31/2018 1108   CHOLHDL 3.2 06/06/2016 0135   VLDL 22 06/06/2016 0135   LDLCALC 61 05/31/2018 1108   No new images since yesterday.   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 She presented to the ED with a hx of neuropathy x 6 mos but with progressive numbness and weakness moving proximately over 2 weeks. Her CT head showed atrophy, MRI showed some  microhemorhhages in a non specific pattern. Nutritional and autoimmune labs so far have been non revealing. LP with CSF studies with no CSF pleocytosis, no elevated CSF protein and profile is not suggestive of GBS.  Impression:  Progressive numbness and weakness over months with acute on chronic worsening greatly over past 2 weeks. Workup for nutritional, autoimmune causes so far is unrevealing, low suspicion for infection, unlikely to be GBS given +reflexes and negative CSF profile about 2 weeks into her symptoms.  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 I told her in very clear terms to stop drinking.   Recommendations:  - continue thiamine replacement, can  transition to PO 100mg  daily at discharge. - Continue PO Multivitamin - Recommend Vitamin B12 po 1000mg  daily. - Follow up with neuromuscular outpatient for EMG and NCS in 4-6 weeks. - Neurology inpatient team will signoff. Please feel free to contact us with any questions or concerns.  [place] Pager Number [phone]

## 2020-08-20 NOTE — Progress Notes (Signed)
Patients daughter would like update regarding mothers care and plan. Her name is Mary [name] [phone].  Alegandro Macnaughton, RN

## 2020-08-20 NOTE — Progress Notes (Signed)
[facility] for Heparin Indication: Mechanical MVR  Allergies  Allergen Reactions  . Oxycodone Itching  . Tape Rash and Other (See Comments)    THE ONLY TAPE THAT IS TOLERATED IS PAPER TAPE. EKG leads inflame the skin    Patient Measurements: Height: 5\' 2"  (157.5 cm) Weight: 51.3 kg (113 lb 1.5 oz) IBW/kg (Calculated) : 50.1  Vital Signs: Temp: 99.1 F (37.3 C) (11/25 1340) Temp Source: Oral (11/25 1340) BP: 126/68 (11/25 1340) Pulse Rate: 102 (11/25 1340)  Labs: Recent Labs    08/18/20 0004 08/18/20 0131 08/18/20 0810 08/18/20 1635 08/18/20 2152 08/19/20 0430 08/19/20 0430 08/20/20 0437 08/20/20 1604  HGB   < >  --   --   --   --  7.3*   < > 6.6* 8.7*  HCT   < >  --   --   --   --  20.6*  --  18.4* 24.6*  PLT  --   --   --   --   --  318  --  317 295  LABPROT  --  >90.0*  --   --  16.4* 16.1*  --   --   --   INR  --  >10.0*  --   --  1.4* 1.4*  --   --   --   HEPARINUNFRC  --   --   --   --   --   --   --  0.25* 0.29*  CREATININE  --   --  1.21*  --   --   --   --  0.73  --   CKTOTAL  --   --   --  39  --   --   --   --   --    < > = values in this interval not displayed.    Estimated Creatinine Clearance: 61.4 mL/min (by C-G formula based on SCr of 0.73 mg/dL).    Assessment: 75 YOF on Coumadin PTA for history of AFib.  Initially thought to have a GIB, but GI has evaluated patient and states no GI issues currently and they have signed off.  Currently on IV heparin bridge.  Heparin level is slightly sub-therapeutic.  No bleeding or issue with heparin infusion per RN.  Goal of Therapy:  Heparin level 0.3-0.5 units/ml Monitor platelets by anticoagulation protocol: Yes   Plan:  Increase heparin gtt to 900 units/hr F/U AM labs  Skylen Spiering D. Mina Marble, PharmD, BCPS, [place] 08/20/2020, 5:17 PM

## 2020-08-21 LAB — PROTEIN ELECTROPHORESIS, SERUM
A/G Ratio: 1.1 (ref 0.7–1.7)
Albumin ELP: 3.2 g/dL (ref 2.9–4.4)
Alpha-1-Globulin: 0.3 g/dL (ref 0.0–0.4)
Alpha-2-Globulin: 0.5 g/dL (ref 0.4–1.0)
Beta Globulin: 0.7 g/dL (ref 0.7–1.3)
Gamma Globulin: 1.4 g/dL (ref 0.4–1.8)
Globulin, Total: 2.8 g/dL (ref 2.2–3.9)
Total Protein ELP: 6 g/dL (ref 6.0–8.5)

## 2020-08-21 LAB — COMPREHENSIVE METABOLIC PANEL
ALT: 18 U/L (ref 0–44)
AST: 20 U/L (ref 15–41)
Albumin: 2.1 g/dL — ABNORMAL LOW (ref 3.5–5.0)
Alkaline Phosphatase: 75 U/L (ref 38–126)
Anion gap: 9 (ref 5–15)
BUN: 6 mg/dL (ref 6–20)
CO2: 20 mmol/L — ABNORMAL LOW (ref 22–32)
Calcium: 7.9 mg/dL — ABNORMAL LOW (ref 8.9–10.3)
Chloride: 109 mmol/L (ref 98–111)
Creatinine, Ser: 0.88 mg/dL (ref 0.44–1.00)
GFR, Estimated: >60 mL/min (ref >60)
Glucose, Bld: 102 mg/dL — ABNORMAL HIGH (ref 70–99)
Potassium: 3.6 mmol/L (ref 3.5–5.1)
Sodium: 138 mmol/L (ref 135–145)
Total Bilirubin: 0.8 mg/dL (ref 0.3–1.2)
Total Protein: 4.6 g/dL — ABNORMAL LOW (ref 6.5–8.1)

## 2020-08-21 LAB — CALCIUM, IONIZED: Calcium, Ionized, Serum: 4.9 mg/dL (ref 4.5–5.6)

## 2020-08-21 LAB — HEPARIN LEVEL (UNFRACTIONATED): Heparin Unfractionated: 0.42 IU/mL (ref 0.30–0.70)

## 2020-08-21 LAB — CBC
HCT: 23.1 % — ABNORMAL LOW (ref 36.0–46.0)
Hemoglobin: 8.3 g/dL — ABNORMAL LOW (ref 12.0–15.0)
MCH: 40.7 pg — ABNORMAL HIGH (ref 26.0–34.0)
MCHC: 35.9 g/dL (ref 30.0–36.0)
MCV: 113.2 fL — ABNORMAL HIGH (ref 80.0–100.0)
Platelets: 310 10*3/uL (ref 150–400)
RBC: 2.04 MIL/uL — ABNORMAL LOW (ref 3.87–5.11)
WBC: 5.9 10*3/uL (ref 4.0–10.5)
nRBC: 0 % (ref 0.0–0.2)

## 2020-08-21 LAB — CYTOLOGY - NON PAP

## 2020-08-21 MED ORDER — WARFARIN SODIUM 5 MG PO TABS
5.0000 mg | ORAL_TABLET | Freq: Once | ORAL | Status: AC
  Administered 2020-08-21: 5 mg via ORAL
  Filled 2020-08-21: qty 1

## 2020-08-21 MED ORDER — WARFARIN - PHARMACIST DOSING INPATIENT: Freq: Every day | Status: DC

## 2020-08-21 NOTE — TOC Initial Note (Signed)
Transition of Care [facility]) - Initial/Assessment Note    Patient Details  Name: Mary [name] MRN: [id number] Date of Birth: 1961-07-17  Transition of Care [facility]) CM/SW Contact:    Bartholomew Crews, RN Phone Number: [phone] 08/21/2020, 4:40 PM  Clinical Narrative:                  Spoke with patient and daughter, Mary [name], at the bedside to discuss transition planning. PTA patient living in a 2nd floor apartment at [facility] apartments off [address]. She stated that she gets assistance from [employer], but the assistance is now from [employer].   She stated that she keeps getting denied disability and is on her 3rd appeal stating that someone from [place] in [place] has called her to assist with appeal. Patient stated that she was given Family Planning Medicaid at the time of one of her denials, however, what patient really needs is Medicaid for hospitalization and outpatient services, including prescriptions.   Patient also receives food stamps assistance.   She is followed by heart failure clinic who provides transportation to/from appointments. She stated that she otherwise does not go anywhere and has not used the bus system since 2020.   She is also followed by PCP at [facility].   She stated that she gets some prescription assistance, but sometimes has to pay $4/prescription. Patient used [place] program in 04/2020, so is not eligible for MATCH at this time.   Mary [name] would like to assist her mom with getting what she needs. NCM reached out by email to financial counselor for possible assistance, however, Mary [name] was advised that it is unlikely anything can be done before Monday d/t weekend.   Discussed needing payor for rehab facility. However, NCM advised that without a payor this is not an option. Discussed working with therapy and walking with nursing in the halls to work on her strength and endurance.    Patient has used [facility] in the past. She has a cane, but would  benefit from RW and 3N1. Discussed getting this through charity program.    TOC following for transition needs.   Expected Discharge Plan: [place] Barriers to Discharge: Continued Medical Work up   Patient Goals and CMS Choice Patient states their goals for this hospitalization and ongoing recovery are:: get rehab CMS Medicare.gov Compare Post Acute Care list provided to:: Patient Choice offered to / list presented to : Patient  Expected Discharge Plan and Services Expected Discharge Plan: [place] In-house Referral: Clinical Social Work, Conservation officer, nature Services: CM Consult Post Acute Care Choice: Museum/gallery conservator, Home Health Living arrangements for the past 2 months: Apartment                 DME Arranged: 3-N-1, Environmental consultant DME Agency: [employer]       HH Arranged: PT          Prior Living Arrangements/Services Living arrangements for the past 2 months: Apartment Lives with:: Self Patient language and need for interpreter reviewed:: Yes        Need for Family Participation in Patient Care: Yes (Comment) Care giver support system in place?: No (comment) Current home services: DME (cane) Criminal Activity/Legal Involvement Pertinent to Current Situation/Hospitalization: No - Comment as needed  Activities of Daily Living Home Assistive Devices/Equipment: Cane (specify quad or straight) ADL Screening (condition at time of admission) Patient's cognitive ability adequate to safely complete daily activities?: Yes Is  the patient deaf or have difficulty hearing?: No Does the patient have difficulty seeing, even when wearing glasses/contacts?: No Does the patient have difficulty concentrating, remembering, or making decisions?: No Patient able to express need for assistance with ADLs?: No Does the patient have difficulty dressing or bathing?: No Independently performs ADLs?: Yes (appropriate for  developmental age) Does the patient have difficulty walking or climbing stairs?: No Weakness of Legs: Both Weakness of Arms/Hands: Both  Permission Sought/Granted Permission sought to share information with : Family Supports Permission granted to share information with : Yes, Verbal Permission Granted  Share Information with NAME: [name]     Permission granted to share info w Relationship: daughter  Permission granted to share info w Contact Information: [phone]  Emotional Assessment Appearance:: Appears stated age Attitude/Demeanor/Rapport: Engaged Affect (typically observed): Accepting Orientation: : Oriented to Self, Oriented to  Time, Oriented to Place, Oriented to Situation Alcohol / Substance Use: Not Applicable Psych Involvement: No (comment)  Admission diagnosis:  Numbness [R20.0] Paresthesia [R20.2] Weakness [R53.1] Drug toxicity [R89.2] Acute kidney injury ([place]) [N17.9] Supratherapeutic INR [R79.1] Patient Active Problem List   Diagnosis Date Noted  . Paresthesia   . Folic acid deficiency 58/59/2924  . H/O mitral valve replacement   . GERD (gastroesophageal reflux disease)   . Lactic acid acidosis   . Marijuana abuse   . Macrocytic anemia 04/04/2019  . Intermittent complete heart block ([place])   . Syncope 03/28/2019  . Volume depletion 05/24/2018  . Acute kidney injury ([place]) 05/23/2018  . Orthostatic hypotension 01/11/2017  . Chronic hyponatremia 01/11/2017  . Elevated liver enzymes 01/11/2017  . Atypical chest pain 06/05/2016  . Palpitations 03/20/2016  . Sinus tachycardia 02/01/2016  . AKI (acute kidney injury) ([place]) 01/10/2016  . Supratherapeutic INR 01/10/2016  . TIA (transient ischemic attack) 09/13/2015  . History of rheumatic heart disease 09/13/2015  . CVA (cerebral vascular accident) ([place]) 09/12/2015  . SOB (shortness of breath) 02/24/2014  . First degree heart block 06/17/2013  . Weakness 06/16/2013  . (HFpEF) heart failure with  preserved ejection fraction ([place]) 06/16/2013  . Anemia 06/16/2013  . Thrombocytosis 06/16/2013  . History of bacterial endocarditis 06/16/2013  . Heart valve replaced by other means 06/10/2013  . Warfarin anticoagulation 06/10/2013  . History of complete heart block 05/28/2013  . S/P mitral valve replacement with metallic valve 46/28/6381  . S/P minimally-invasive tricuspid valve repair 05/23/2013  . Femoral hernia 05/23/2013  . Mitral valve insufficiency 04/22/2013  . Mitral valve disorders(424.0) 04/22/2013  . Tricuspid regurgitation 04/22/2013  . 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 SVT (supraventricular tachycardia) ([place]) 03/16/2013   PCP:  Antony Blackbird, MD Pharmacy:   [facility] [address] ([address]), [place] [address] [place]) [place] [address] Phone: [phone] Fax: [phone]     Social Determinants of Health (SDOH) Interventions    Readmission Risk Interventions No flowsheet data found.

## 2020-08-21 NOTE — Progress Notes (Signed)
Occupational Therapy Evaluation Patient Details Name: Mary [name] MRN: [id number] DOB: [birth year] Today's Date: 08/21/2020    History of Present Illness Pt is a 59 y/o female admitted secondary to weakness and dizziness. Also reporting increased parethesias in face and all over body. MRI showed multiple punctuate hemorrhages. PMH includes HTN, s/p MVR, and CHF.    Clinical Impression   PTA patient was living alone and was independent with BADLs/IADLs with use of SPC. Patient currently demonstrates deficits in static/standing balance, activity tolerance, and deficits in functional mobility and self-care requiring Min A for ADL transfers with [facility] and heavy hand-held assist, and Min A for toileting/hygiene/clothing management. Patient would benefit from continued acute OT services to maximize safety and independence with self-care tasks in prep for d/c to next level of care. Given patients CLOF, deficits, and limited caregiver support from family, recommendation for SNF rehab prior to return home. Patient in agreement with recommendations.     Follow Up Recommendations  SNF    Equipment Recommendations  3 in 1 bedside commode    Recommendations for Other Services       Precautions / Restrictions Precautions Precautions: Fall Restrictions Weight Bearing Restrictions: No      Mobility Bed Mobility Overal bed mobility: Needs Assistance Bed Mobility: Supine to Sit;Sit to Supine     Supine to sit: Min assist;HOB elevated Sit to supine: Min assist   General bed mobility comments: Required assist for LEs to come to sitting.     Transfers Overall transfer level: Needs assistance Equipment used: Rolling walker (2 wheeled);Straight cane Transfers: Sit to/from Stand Sit to Stand: Min assist         General transfer comment: Min A for STS x2 trials from EOB and BSC.     Balance Overall balance assessment: Needs assistance Sitting-balance support: No upper extremity  supported;Feet supported Sitting balance-Leahy Scale: Good     Standing balance support: Bilateral upper extremity supported;During functional activity Standing balance-Leahy Scale: Poor Standing balance comment: Reliant on BUE support and external support                            ADL either performed or assessed with clinical judgement   ADL Overall ADL's : Needs assistance/impaired Eating/Feeding: Set up;Sitting   Grooming: Minimal assistance;Standing Grooming Details (indicate cue type and reason): Min A for balance during hand hygiene at sink level.                  Toilet Transfer: Minimal assistance;Ambulation;BSC Toilet Transfer Details (indicate cue type and reason): Use of SPC to [facility] over toilet in bathroom.  Toileting- Clothing Manipulation and Hygiene: Minimal assistance;Sit to/from stand Toileting - Clothing Manipulation Details (indicate cue type and reason): Min A for hygiene/clothing management in standing 2/2 poor balance.      Functional mobility during ADLs: Minimal assistance General ADL Comments: Min A and heavy hand held assist for steadying with use of SPC.      Vision Baseline Vision/History: Wears glasses Wears Glasses: Reading only Patient Visual Report: No change from baseline Vision Assessment?: No apparent visual deficits     Perception     Praxis      Pertinent Vitals/Pain Pain Assessment: Faces Faces Pain Scale: Hurts a little bit Pain Location: "all over" Pain Descriptors / Indicators: Numbness;Tingling Pain Intervention(s): Monitored during session     Hand Dominance Right   Extremity/Trunk Assessment Upper Extremity Assessment Upper Extremity Assessment: Generalized weakness  Lower Extremity Assessment RLE Deficits / Details: Reports numbness and tingling in BLE  LLE Deficits / Details: Reports numbness and tingling in BLE    Cervical / Trunk Assessment Cervical / Trunk Assessment: Normal   Communication  Communication Communication: No difficulties   Cognition Arousal/Alertness: Awake/alert Behavior During Therapy: WFL for tasks assessed/performed Overall Cognitive Status: Within Functional Limits for tasks assessed                                     General Comments  Patient c/o continued numbness in face/scap and hands/feet    Exercises     Shoulder Instructions      Home Living Family/patient expects to be discharged to:: Private residence Living Arrangements: Alone Available Help at Discharge: Family;Available PRN/intermittently Type of Home: Apartment Home Access: Stairs to enter Entrance Stairs-Number of Steps: 2 flights Entrance Stairs-Rails: Right;Left Home Layout: One level     Bathroom Shower/Tub: Tub/shower unit;Walk-in shower   Bathroom Toilet: Standard     Home Equipment: Cane - single point          Prior Functioning/Environment Level of Independence: Independent with assistive device(s)        Comments: has been using cane         OT Problem List: Decreased strength;Decreased activity tolerance;Impaired balance (sitting and/or standing);Decreased coordination;Decreased safety awareness;Decreased knowledge of use of DME or AE;Impaired sensation;Increased edema      OT Treatment/Interventions: Self-care/ADL training;Therapeutic exercise;DME and/or AE instruction;Energy conservation;Therapeutic activities;Patient/family education;Balance training    OT Goals(Current goals can be found in the care plan section) Acute Rehab OT Goals Patient Stated Goal: to be able to walk better before going home OT Goal Formulation: With patient Time For Goal Achievement: 09/04/20 Potential to Achieve Goals: Good ADL Goals Pt Will Perform Grooming: with modified independence;standing Pt Will Perform Upper Body Dressing: with modified independence;sitting Pt Will Perform Lower Body Dressing: with modified independence;sit to/from stand Pt Will  Transfer to Toilet: with modified independence;ambulating;grab bars;bedside commode Pt Will Perform Toileting - Clothing Manipulation and hygiene: with modified independence;sit to/from stand Pt/caregiver will Perform Home Exercise Program: Increased strength;Both right and left upper extremity;With written HEP provided  OT Frequency: Min 2X/week   Barriers to D/C: Decreased caregiver support          Co-evaluation              AM-PAC OT "6 Clicks" Daily Activity     Outcome Measure Help from another person eating meals?: None Help from another person taking care of personal grooming?: A Little Help from another person toileting, which includes using toliet, bedpan, or urinal?: A Little Help from another person bathing (including washing, rinsing, drying)?: A Little Help from another person to put on and taking off regular upper body clothing?: None Help from another person to put on and taking off regular lower body clothing?: A Little 6 Click Score: 20   End of Session Equipment Utilized During Treatment: Gait belt;Rolling walker;Other (comment) [facility]) Nurse Communication: Mobility status  Activity Tolerance: Patient tolerated treatment well Patient left: in bed;with call bell/phone within reach;with bed alarm set  OT Visit Diagnosis: Unsteadiness on feet (R26.81);Muscle weakness (generalized) (M62.81);History of falling (Z91.81);Other symptoms and signs involving the nervous system (R29.898)                Time: 1610-9604 OT Time Calculation (min): 23 min Charges:  OT General Charges $OT Visit: 1  Visit OT Evaluation $OT Eval Moderate Complexity: 1 Mod OT Treatments $Self Care/Home Management : 8-22 mins  Angeles Zehner H. OTR/L Supplemental OT, Department of rehab services [phone]  Srinika Delone R H. 08/21/2020, 11:08 AM

## 2020-08-21 NOTE — Progress Notes (Signed)
[place] for Heparin and resume Warfarin Indication: Mechanical MVR  Allergies  Allergen Reactions  . Oxycodone Itching  . Tape Rash and Other (See Comments)    THE ONLY TAPE THAT IS TOLERATED IS PAPER TAPE. EKG leads inflame the skin    Patient Measurements: Height: 5\' 2"  (157.5 cm) Weight: 51.3 kg (113 lb 1.5 oz) IBW/kg (Calculated) : 50.1  Vital Signs: Temp: 98 F (36.7 C) (11/26 0449) Temp Source: Oral (11/26 0449) BP: 128/78 (11/26 0449) Pulse Rate: 98 (11/26 0449)  Labs: Recent Labs    08/18/20 1635 08/18/20 2152 08/19/20 0430 08/19/20 0430 08/20/20 0437 08/20/20 0437 08/20/20 1604 08/21/20 0307  HGB  --   --  7.3*   < > 6.6*   < > 8.7* 8.3*  HCT  --   --  20.6*   < > 18.4*  --  24.6* 23.1*  PLT  --   --  318   < > 317  --  295 310  LABPROT  --  16.4* 16.1*  --   --   --   --   --   INR  --  1.4* 1.4*  --   --   --   --   --   HEPARINUNFRC  --   --   --   --  0.25*  --  0.29* 0.42  CREATININE  --   --   --   --  0.73  --   --  0.88  CKTOTAL 39  --   --   --   --   --   --   --    < > = values in this interval not displayed.    Estimated Creatinine Clearance: 55.8 mL/min (by C-G formula based on SCr of 0.88 mg/dL).    Assessment: 80 YOF on Coumadin PTA for history of mechantical MVR. Initially thought to have a GIB, but GI has evaluated patient and states no GI issues currently and they have signed off.  Currently on IV heparin bridge.  Heparin level is therapeutic on heparin 900 units/hr.    Pharmacy consulted to resume Warfarin.  Goal INR =2.5-3.5 per coumadin clinic notes.   PTA warfarin dose:  2.5 mg every Sunday and Wed,  5mg  all other days of the week.  Last taken PTA on 11/22.   INR >10 on admission, reversed by Vitamin K 10 mg IVPB x1 on 11/23.  INR down to 1.4 on 11/24.  No bleeding noted.    Goal of Therapy:  Goal INR 2.5-3.5 Heparin level 0.3-0.5 units/ml Monitor platelets by anticoagulation protocol:  Yes   Plan:  Continue Heparin gtt 900 units/hr Coumadin 5mg  today x1 Daily heparin level , PT/INR and CBC  Nicole Cella, RPh Clinical Pharmacist [phone] Please check AMION for all [place] phone numbers After 10:00 PM, call [place] [phone]  08/21/2020, 9:46 AM

## 2020-08-21 NOTE — Progress Notes (Addendum)
PROGRESS NOTE    Mary [name]  MRN:[id number] DOB: 01/15/1961 DOA: 08/17/2020 PCP: Fulp, Cammie, MD  Brief Narrative: 58-year-old female with history of mechanical mitral valve and tricuspid repair in 2014 on chronic Coumadin, alcohol abuse, macrocytic anemia, chronic diastolic CHF, chronic erosive gastritis, pyloric stenosis, Called EMS due to multiple complaints namely numbness weakness dizziness etc. -Noted to have peripheral neuropathy and multiple other neurological symptoms -Also long history of alcoholism  Assessment & Plan:   Progressive neuropathy Myalgias -ESR and CRP are within normal limits -Chronic alcoholism suspected to be contributing, nutritional deficiencies also possible -Appreciate neurology input, LP completed 11/24 in IR, CSF preliminary studies are all unremarkable -MRI notes few microhemorrhages -ANA, rheumatoid factor, ACE levels and heavy metal screen are all normal  -Vitamin B1, B6, methylmalonic acid level,  SPEP pending at this time -Continue high-dose thiamine -Taking very high doses of gabapentin which was contributing to dizziness, now resumed at a lower dose -Counseled regarding alcohol cessation -PT OT eval completed SNF recommended for rehabilitation -Neurology recommended outpatient follow-up with neuromuscular neurology for EMG/nerve conduction studies  History of mechanical mitral valve -Supratherapeutic INR on admission, reversed with vitamin K on admission, now subtherapeutic -Continue IV heparin, restart Coumadin  Acute on chronic macrocytic anemia -Alcoholism likely contributing, B12 and folate within normal range now, also follow-up MMA -Reticulocyte count is up, follow-up peripheral smear and SPEP -EGD and colonoscopy in August noted pyloric stenosis with some narrowing, negative H. pylori, colonoscopy was negative -No overt bleeding reported, hemoglobin stable after transfusion, iron stores are reasonable -Restart Coumadin,  monitor  Chronic diastolic CHF -Clinically appears euvolemic, Lasix on hold  Abnormal LFTs -Pattern consistent with alcoholic liver disease  Alcohol abuse -Counseled, not willing to accept this could be contributing to her symptoms, continue high-dose thiamine -No overt withdrawal, counseled  Severe protein calorie malnutrition -Add supplements  DVT prophylaxis: SCDs Code Status: Full code Family Communication: Called and updated daughter Mary [name] Disposition Plan:  Status is: Inpatient  Remains inpatient appropriate because:Inpatient level of care appropriate due to severity of illness   Dispo: The patient is from: Home              Anticipated d/c is to: SNF              Anticipated d/c date is: 3 days              Patient currently is not medically stable to d/c.  Consultants:   Neuro   Procedures: Lumbar puncture 11/24  Antimicrobials:    Subjective: -Feels a little better overall, continues to have pain, tingling and numbness in her legs   Objective: Vitals:   08/20/20 2108 08/21/20 0449 08/21/20 0947 08/21/20 0949  BP: (!) 138/91 128/78 121/84 121/84  Pulse: (!) 108 98 94 94  Resp: 18 14 17 17  Temp: 98.1 F (36.7 C) 98 F (36.7 C) 98.1 F (36.7 C) 98.1 F (36.7 C)  TempSrc: Oral Oral  Oral  SpO2: 99% 99%  97%  Weight:      Height:        Intake/Output Summary (Last 24 hours) at 08/21/2020 1235 Last data filed at 08/21/2020 0906 Gross per 24 hour  Intake 555 ml  Output 0 ml  Net 555 ml   Filed Weights   08/18/20 1800  Weight: 51.3 kg    Examination:  General exam: Pleasant middle-aged female sitting up in bed, AAO x3 HEENT: Missing front teeth, no JVD CVS: S1-S2, regular rate   PROGRESS NOTE    Mary [name]  KGS:[id number] DOB: [birth year] DOA: 08/17/2020 PCP: Antony Blackbird, MD  Brief Narrative: 58 year old female with history of mechanical mitral valve and tricuspid repair in 2014 on chronic Coumadin, alcohol abuse, macrocytic anemia, chronic diastolic CHF, chronic erosive gastritis, pyloric stenosis, Called EMS due to multiple complaints namely numbness weakness dizziness etc. -Noted to have peripheral neuropathy and multiple other neurological symptoms -Also long history of alcoholism  Assessment & Plan:   Progressive neuropathy Myalgias -ESR and CRP are within normal limits -Chronic alcoholism suspected to be contributing, nutritional deficiencies also possible -Appreciate neurology input, LP completed 11/24 in IR, CSF preliminary studies are all unremarkable -MRI notes few microhemorrhages -ANA, rheumatoid factor, ACE levels and heavy metal screen are all normal  -Vitamin B1, B6, methylmalonic acid level,  SPEP pending at this time -Continue high-dose thiamine -Taking very high doses of gabapentin which was contributing to dizziness, now resumed at a lower dose -Counseled regarding alcohol cessation -PT OT eval completed SNF recommended for rehabilitation -Neurology recommended outpatient follow-up with neuromuscular neurology for EMG/nerve conduction studies  History of mechanical mitral valve -Supratherapeutic INR on admission, reversed with vitamin K on admission, now subtherapeutic -Continue IV heparin, restart Coumadin  Acute on chronic macrocytic anemia -Alcoholism likely contributing, B12 and folate within normal range now, also follow-up MMA -Reticulocyte count is up, follow-up peripheral smear and SPEP -EGD and colonoscopy in August noted pyloric stenosis with some narrowing, negative H. pylori, colonoscopy was negative -No overt bleeding reported, hemoglobin stable after transfusion, iron stores are reasonable -Restart Coumadin,  monitor  Chronic diastolic CHF -Clinically appears euvolemic, Lasix on hold  Abnormal LFTs -Pattern consistent with alcoholic liver disease  Alcohol abuse -Counseled, not willing to accept this could be contributing to her symptoms, continue high-dose thiamine -No overt withdrawal, counseled  Severe protein calorie malnutrition -Add supplements  DVT prophylaxis: SCDs Code Status: Full code Family Communication: Called and updated daughter Mary [name] Disposition Plan:  Status is: Inpatient  Remains inpatient appropriate because:Inpatient level of care appropriate due to severity of illness   Dispo: The patient is from: Home              Anticipated d/c is to: SNF              Anticipated d/c date is: 3 days              Patient currently is not medically stable to d/c.  Consultants:   Neuro   Procedures: Lumbar puncture 11/24  Antimicrobials:    Subjective: -Feels a little better overall, continues to have pain, tingling and numbness in her legs   Objective: Vitals:   08/20/20 2108 08/21/20 0449 08/21/20 0947 08/21/20 0949  BP: (!) 138/91 128/78 121/84 121/84  Pulse: (!) 108 98 94 94  Resp: _0 Temp: 98.1 F (36.7 C) 98 F (36.7 C) 98.1 F (36.7 C) 98.1 F (36.7 C)  TempSrc: Oral Oral  Oral  SpO2: 99% 99%  97%  Weight:      Height:        Intake/Output Summary (Last 24 hours) at 08/21/2020 1235 Last data filed at 08/21/2020 5859 Gross per 24 hour  Intake 555 ml  Output 0 ml  Net 555 ml   Filed Weights   08/18/20 1800  Weight: 51.3 kg    Examination:  General exam: Pleasant middle-aged female sitting up in bed, AAO x3 HEENT: Missing front teeth, no JVD CVS: S1-S2, regular rate  PROGRESS NOTE    Mary [name]  KGS:[id number] DOB: [birth year] DOA: 08/17/2020 PCP: Antony Blackbird, MD  Brief Narrative: 58 year old female with history of mechanical mitral valve and tricuspid repair in 2014 on chronic Coumadin, alcohol abuse, macrocytic anemia, chronic diastolic CHF, chronic erosive gastritis, pyloric stenosis, Called EMS due to multiple complaints namely numbness weakness dizziness etc. -Noted to have peripheral neuropathy and multiple other neurological symptoms -Also long history of alcoholism  Assessment & Plan:   Progressive neuropathy Myalgias -ESR and CRP are within normal limits -Chronic alcoholism suspected to be contributing, nutritional deficiencies also possible -Appreciate neurology input, LP completed 11/24 in IR, CSF preliminary studies are all unremarkable -MRI notes few microhemorrhages -ANA, rheumatoid factor, ACE levels and heavy metal screen are all normal  -Vitamin B1, B6, methylmalonic acid level,  SPEP pending at this time -Continue high-dose thiamine -Taking very high doses of gabapentin which was contributing to dizziness, now resumed at a lower dose -Counseled regarding alcohol cessation -PT OT eval completed SNF recommended for rehabilitation -Neurology recommended outpatient follow-up with neuromuscular neurology for EMG/nerve conduction studies  History of mechanical mitral valve -Supratherapeutic INR on admission, reversed with vitamin K on admission, now subtherapeutic -Continue IV heparin, restart Coumadin  Acute on chronic macrocytic anemia -Alcoholism likely contributing, B12 and folate within normal range now, also follow-up MMA -Reticulocyte count is up, follow-up peripheral smear and SPEP -EGD and colonoscopy in August noted pyloric stenosis with some narrowing, negative H. pylori, colonoscopy was negative -No overt bleeding reported, hemoglobin stable after transfusion, iron stores are reasonable -Restart Coumadin,  monitor  Chronic diastolic CHF -Clinically appears euvolemic, Lasix on hold  Abnormal LFTs -Pattern consistent with alcoholic liver disease  Alcohol abuse -Counseled, not willing to accept this could be contributing to her symptoms, continue high-dose thiamine -No overt withdrawal, counseled  Severe protein calorie malnutrition -Add supplements  DVT prophylaxis: SCDs Code Status: Full code Family Communication: Called and updated daughter Mary [name] Disposition Plan:  Status is: Inpatient  Remains inpatient appropriate because:Inpatient level of care appropriate due to severity of illness   Dispo: The patient is from: Home              Anticipated d/c is to: SNF              Anticipated d/c date is: 3 days              Patient currently is not medically stable to d/c.  Consultants:   Neuro   Procedures: Lumbar puncture 11/24  Antimicrobials:    Subjective: -Feels a little better overall, continues to have pain, tingling and numbness in her legs   Objective: Vitals:   08/20/20 2108 08/21/20 0449 08/21/20 0947 08/21/20 0949  BP: (!) 138/91 128/78 121/84 121/84  Pulse: (!) 108 98 94 94  Resp: _0 Temp: 98.1 F (36.7 C) 98 F (36.7 C) 98.1 F (36.7 C) 98.1 F (36.7 C)  TempSrc: Oral Oral  Oral  SpO2: 99% 99%  97%  Weight:      Height:        Intake/Output Summary (Last 24 hours) at 08/21/2020 1235 Last data filed at 08/21/2020 5859 Gross per 24 hour  Intake 555 ml  Output 0 ml  Net 555 ml   Filed Weights   08/18/20 1800  Weight: 51.3 kg    Examination:  General exam: Pleasant middle-aged female sitting up in bed, AAO x3 HEENT: Missing front teeth, no JVD CVS: S1-S2, regular rate  PROGRESS NOTE    Mary [name]  MRN:[id number] DOB: 01/15/1961 DOA: 08/17/2020 PCP: Fulp, Cammie, MD  Brief Narrative: 58-year-old female with history of mechanical mitral valve and tricuspid repair in 2014 on chronic Coumadin, alcohol abuse, macrocytic anemia, chronic diastolic CHF, chronic erosive gastritis, pyloric stenosis, Called EMS due to multiple complaints namely numbness weakness dizziness etc. -Noted to have peripheral neuropathy and multiple other neurological symptoms -Also long history of alcoholism  Assessment & Plan:   Progressive neuropathy Myalgias -ESR and CRP are within normal limits -Chronic alcoholism suspected to be contributing, nutritional deficiencies also possible -Appreciate neurology input, LP completed 11/24 in IR, CSF preliminary studies are all unremarkable -MRI notes few microhemorrhages -ANA, rheumatoid factor, ACE levels and heavy metal screen are all normal  -Vitamin B1, B6, methylmalonic acid level,  SPEP pending at this time -Continue high-dose thiamine -Taking very high doses of gabapentin which was contributing to dizziness, now resumed at a lower dose -Counseled regarding alcohol cessation -PT OT eval completed SNF recommended for rehabilitation -Neurology recommended outpatient follow-up with neuromuscular neurology for EMG/nerve conduction studies  History of mechanical mitral valve -Supratherapeutic INR on admission, reversed with vitamin K on admission, now subtherapeutic -Continue IV heparin, restart Coumadin  Acute on chronic macrocytic anemia -Alcoholism likely contributing, B12 and folate within normal range now, also follow-up MMA -Reticulocyte count is up, follow-up peripheral smear and SPEP -EGD and colonoscopy in August noted pyloric stenosis with some narrowing, negative H. pylori, colonoscopy was negative -No overt bleeding reported, hemoglobin stable after transfusion, iron stores are reasonable -Restart Coumadin,  monitor  Chronic diastolic CHF -Clinically appears euvolemic, Lasix on hold  Abnormal LFTs -Pattern consistent with alcoholic liver disease  Alcohol abuse -Counseled, not willing to accept this could be contributing to her symptoms, continue high-dose thiamine -No overt withdrawal, counseled  Severe protein calorie malnutrition -Add supplements  DVT prophylaxis: SCDs Code Status: Full code Family Communication: Called and updated daughter Mary [name] Disposition Plan:  Status is: Inpatient  Remains inpatient appropriate because:Inpatient level of care appropriate due to severity of illness   Dispo: The patient is from: Home              Anticipated d/c is to: SNF              Anticipated d/c date is: 3 days              Patient currently is not medically stable to d/c.  Consultants:   Neuro   Procedures: Lumbar puncture 11/24  Antimicrobials:    Subjective: -Feels a little better overall, continues to have pain, tingling and numbness in her legs   Objective: Vitals:   08/20/20 2108 08/21/20 0449 08/21/20 0947 08/21/20 0949  BP: (!) 138/91 128/78 121/84 121/84  Pulse: (!) 108 98 94 94  Resp: 18 14 17 17  Temp: 98.1 F (36.7 C) 98 F (36.7 C) 98.1 F (36.7 C) 98.1 F (36.7 C)  TempSrc: Oral Oral  Oral  SpO2: 99% 99%  97%  Weight:      Height:        Intake/Output Summary (Last 24 hours) at 08/21/2020 1235 Last data filed at 08/21/2020 0906 Gross per 24 hour  Intake 555 ml  Output 0 ml  Net 555 ml   Filed Weights   08/18/20 1800  Weight: 51.3 kg    Examination:  General exam: Pleasant middle-aged female sitting up in bed, AAO x3 HEENT: Missing front teeth, no JVD CVS: S1-S2, regular rate

## 2020-08-21 NOTE — Evaluation (Signed)
Clinical/Bedside Swallow Evaluation Patient Details  Name: Mary [name] MRN: [id number] Date of Birth: June 24, 1961  Today's Date: 08/21/2020 Time: SLP Start Time (ACUTE ONLY): 1534 SLP Stop Time (ACUTE ONLY): 1559 SLP Time Calculation (min) (ACUTE ONLY): 25 min  Past Medical History:  Past Medical History:  Diagnosis Date  . Acute diastolic heart failure (HCC) 03/15/2013  . Anemia   . B12 deficiency   . Carotid stenosis    Carotid US 2/17: bilateral ICA 1-39% >> FU prn  . Childhood asthma   . Complete heart block (HCC)   . Epigastric hernia 200's  . GERD (gastroesophageal reflux disease)   . Heart murmur   . History of blood transfusion    "14 w/1st pregnancy; 2 w/last C-section" (03/15/2013)  . Hx of echocardiogram    a. Echo 4/16:  Mild focal basal septal hypertrophy, EF 60-65%, no RWMA, Mechanical MVR ok with mild central regurgitation and no perivalvular leak, small mobile density attached to valvular ring (1.5x1 cm) - post surgical changes vs SBE, mild LAE;   b. Echo 2/17:  Mild post wall LVH, EF 55-60%, no RWMA, mechanical MVR ok (mean 5 mmHg), normal RVSF  . Hypertension    no pcp   will go to [facility]  . Macrocytic anemia   . 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   33mm Sorin Carbomedics mechanical prosthesis via right mini thoracotomy approach  . 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 Severe mitral regurgitation 04/22/2013  . Sinus tachycardia   . SVT (supraventricular tachycardia) (HCC) 03/16/2013  . TIA (transient ischemic attack)   . Tricuspid regurgitation    Past Surgical History:  Past Surgical History:  Procedure Laterality Date  . APPENDECTOMY  [id number]  . BIOPSY  05/01/2020   Procedure: BIOPSY;  Surgeon: Rachael Fee, MD;  Location: [facility] ENDOSCOPY;  Service: Endoscopy;;  . CARDIAC CATHETERIZATION    . CARDIAC VALVE REPLACEMENT    . CESAREAN SECTION  1983  . CESAREAN SECTION WITH BILATERAL TUBAL LIGATION  1999  . COLONOSCOPY WITH PROPOFOL N/A 05/01/2020   Procedure: COLONOSCOPY WITH PROPOFOL;  Surgeon: Rachael Fee, MD;  Location: [facility] ENDOSCOPY;  Service: Endoscopy;  Laterality: N/A;  . ESOPHAGOGASTRODUODENOSCOPY (EGD) WITH PROPOFOL N/A 05/01/2020   Procedure: ESOPHAGOGASTRODUODENOSCOPY (EGD) WITH PROPOFOL;  Surgeon: Rachael Fee, MD;  Location: [facility] ENDOSCOPY;  Service: Endoscopy;  Laterality: N/A;  . FEMORAL HERNIA REPAIR Right 05/23/2013   Procedure: HERNIA REPAIR FEMORAL;  Surgeon: Purcell Nails, MD;  Location: [facility] OR;  Service: Open Heart Surgery;  Laterality: Right;  . INTRAOPERATIVE TRANSESOPHAGEAL ECHOCARDIOGRAM N/A 05/23/2013   Procedure: INTRAOPERATIVE TRANSESOPHAGEAL ECHOCARDIOGRAM;  Surgeon: Purcell Nails, MD;  Location: [facility] OR;  Service: Open Heart Surgery;  Laterality: N/A;  . LAPAROSCOPIC CHOLECYSTECTOMY  2003  . LEFT AND RIGHT HEART CATHETERIZATION WITH CORONARY ANGIOGRAM N/A 03/22/2013   Procedure: LEFT AND RIGHT HEART CATHETERIZATION WITH CORONARY ANGIOGRAM;  Surgeon: Kathleene Hazel, MD;  Location: [facility] CATH LAB;  Service: Cardiovascular;  Laterality: N/A;  . MINIMALLY INVASIVE TRICUSPID VALVE REPAIR Right 05/23/2013   Procedure: MINIMALLY INVASIVE TRICUSPID VALVE REPAIR;  Surgeon: Purcell Nails, MD;  Location: [facility] OR;  Service: Open Heart Surgery;  Laterality: Right;  . MITRAL VALVE REPLACEMENT N/A 05/23/2013   Procedure: MITRAL VALVE (MV) REPLACEMENT;  Surgeon:  Purcell Nails, MD;  Location: [facility] OR;  Service: Open Heart Surgery;  Laterality: N/A;  . MULTIPLE EXTRACTIONS WITH ALVEOLOPLASTY N/A 04/04/2013   Procedure: Extraction of tooth #'s 1,8,9,13,14,15,23,24,25,26 with alveoloplasty and gross debridement of remaining teeth;  Surgeon: Charlynne Pander, DDS;  Location: [facility] OR;   Service: Oral Surgery;  Laterality: N/A;  . TEE WITHOUT CARDIOVERSION N/A 03/18/2013   Procedure: TRANSESOPHAGEAL ECHOCARDIOGRAM (TEE);  Surgeon: Laurey Morale, MD;  Location: [facility] ENDOSCOPY;  Service: Cardiovascular;  Laterality: N/A;  . TEE WITHOUT CARDIOVERSION N/A 06/17/2013   Procedure: TRANSESOPHAGEAL ECHOCARDIOGRAM (TEE);  Surgeon: Lewayne Bunting, MD;  Location: [facility] ENDOSCOPY;  Service: Cardiovascular;  Laterality: N/A;  . TUBAL LIGATION  1999   HPI:  Pt is a 59 y.o. female with medical history significant of mechanical mitral valve and tricuspid repair in 2014 on chronic Coumadin, alcohol abuse, macrocytic anemia, chronic diastolic CHF, chronic erosive gastritis, pyloric stenosis. She has peripheral neuropathy for which she was prescribed neurontin. She reported on admission that due to paresthesia affecting lips and mouth she hasn't been eating or drinking fluids, living on ensure, pudding and jello. She Called EMS due to numbness, difficulty swallowing, weakness, and dizziness. MRI brain 11/23: Fewer than 5 scattered microhemorrhages in a nonspecific pattern. Neurology consulted: LP with CSF studies with no CSF pleocytosis, no elevated CSF protein and profile is not suggestive of GBS.     Assessment / Plan / Recommendation Clinical Impression  Pt was seen for bedside swallow evaluation with her daughter present. Both parties denied the pt having any signs of aspiration, but they reported pt having difficulty with mastication of regular texture solids and drinking from cups. Pt stated that she has mostly been consuming mashed potatoes and drinking the broth from chicken noodle soup since admission. Oral mechanism exam was significant for reduced lingual sensation with more significant difficulty on the left, but inconsistencies noted on the left. Facial  ROM was mildly reduced on the right. She demonstrated reduced labial seal and impaired mastication with munching pattern. Pt was unable to  masticate ice chips or regular texture solids; she ultimately spat out regular solids and swallowed the partially-melted ice chip whole. Difficulty with posterior bolus propulsion is suspected and pt inconsistently used lateral and posterior held tilts to assist in bolus movement stating that she could not "get it back". No s/sx of aspiration were noted with solids or liquids. A dysphagia 2 diet with thin liquids is recommended at this time and SLP will follow pt.  SLP Visit Diagnosis: Dysphagia, unspecified (R13.10)    Aspiration Risk  Mild aspiration risk    Diet Recommendation Dysphagia 2 (Fine chop);Thin liquid   Liquid Administration via: Cup;Straw Medication Administration: Whole meds with puree Supervision: Patient able to self feed Compensations: Small sips/bites;Slow rate Postural Changes: Seated upright at 90 degrees    Other  Recommendations Oral Care Recommendations: Oral care BID   Follow up Recommendations Skilled Nursing facility      Frequency and Duration min 2x/week  2 weeks       Prognosis Prognosis for Safe Diet Advancement: Good      Swallow Study   General Date of Onset: 08/14/20 HPI: Pt is a 59 y.o. female with medical history significant of mechanical mitral valve and tricuspid repair in 2014 on chronic Coumadin, alcohol abuse, macrocytic anemia, chronic diastolic CHF, chronic erosive gastritis, pyloric stenosis. She has peripheral neuropathy for which she was prescribed neurontin. She reported on admission that due to paresthesia affecting  lips and mouth she hasn't been eating or drinking fluids, living on ensure, pudding and jello. She Called EMS due to numbness, difficulty swallowing, weakness, and dizziness. MRI brain 11/23: Fewer than 5 scattered microhemorrhages in a nonspecific pattern. Neurology consulted: LP with CSF studies with no CSF pleocytosis, no elevated CSF protein and profile is not suggestive of GBS.   Type of Study: Bedside Swallow  Evaluation Previous Swallow Assessment: None Diet Prior to this Study: Regular;Thin liquids Temperature Spikes Noted: No Respiratory Status: Room air History of Recent Intubation: No Behavior/Cognition: Alert;Cooperative;Pleasant mood Oral Cavity Assessment: Within Functional Limits Oral Care Completed by SLP: No Oral Cavity - Dentition: Missing dentition Vision: Impaired for self-feeding Self-Feeding Abilities: Able to feed self Patient Positioning: Upright in bed Baseline Vocal Quality: Normal Volitional Cough: Strong Volitional Swallow: Able to elicit    Oral/Motor/Sensory Function Overall Oral Motor/Sensory Function: Mild impairment Facial ROM: Reduced right;Suspected CN VII (facial) dysfunction Facial Symmetry: Abnormal symmetry left;Suspected CN VII (facial) dysfunction Facial Strength: Reduced left;Suspected CN VII (facial) dysfunction Lingual ROM: Within Functional Limits Lingual Symmetry: Within Functional Limits Lingual Strength: Within Functional Limits Lingual Sensation: Reduced;Suspected CN VII (facial) dysfunction-anterior 2/3 tongue Velum: Within Functional Limits Mandible: Within Functional Limits   Ice Chips Ice chips: Impaired Presentation: Spoon Oral Phase Impairments: Impaired mastication;Reduced lingual movement/coordination (postural movements necessary to bolus manipulation)   Thin Liquid Thin Liquid: Impaired Presentation: Cup;Straw Oral Phase Impairments: Reduced labial seal Oral Phase Functional Implications: Right anterior spillage    Nectar Thick Nectar Thick Liquid: Not tested   Honey Thick Honey Thick Liquid: Not tested   Puree Puree: Impaired Presentation: Spoon Oral Phase Impairments: Reduced lingual movement/coordination   Solid     Solid: Impaired Oral Phase Impairments: Impaired mastication;Reduced lingual movement/coordination Oral Phase Functional Implications: Impaired mastication     Allegra Cerniglia I. Vear Clock, MS, CCC-SLP Acute  Rehabilitation Services Office number [phone] Pager [phone]  Scheryl Marten 08/21/2020,4:27 PM

## 2020-08-22 LAB — CBC
HCT: 22.3 % — ABNORMAL LOW (ref 36.0–46.0)
Hemoglobin: 7.7 g/dL — ABNORMAL LOW (ref 12.0–15.0)
MCH: 39.3 pg — ABNORMAL HIGH (ref 26.0–34.0)
MCHC: 34.5 g/dL (ref 30.0–36.0)
MCV: 113.8 fL — ABNORMAL HIGH (ref 80.0–100.0)
Platelets: 308 10*3/uL (ref 150–400)
RBC: 1.96 MIL/uL — ABNORMAL LOW (ref 3.87–5.11)
WBC: 6.4 10*3/uL (ref 4.0–10.5)
nRBC: 0 % (ref 0.0–0.2)

## 2020-08-22 LAB — BASIC METABOLIC PANEL
Anion gap: 9 (ref 5–15)
BUN: 6 mg/dL (ref 6–20)
CO2: 21 mmol/L — ABNORMAL LOW (ref 22–32)
Calcium: 8 mg/dL — ABNORMAL LOW (ref 8.9–10.3)
Chloride: 109 mmol/L (ref 98–111)
Creatinine, Ser: 0.94 mg/dL (ref 0.44–1.00)
GFR, Estimated: >60 mL/min (ref >60)
Glucose, Bld: 84 mg/dL (ref 70–99)
Potassium: 4.1 mmol/L (ref 3.5–5.1)
Sodium: 139 mmol/L (ref 135–145)

## 2020-08-22 LAB — PROTIME-INR
INR: 1.2 (ref 0.8–1.2)
Prothrombin Time: 14.6 seconds (ref 11.4–15.2)

## 2020-08-22 LAB — HEPARIN LEVEL (UNFRACTIONATED): Heparin Unfractionated: 0.34 IU/mL (ref 0.30–0.70)

## 2020-08-22 LAB — FANA STAINING PATTERNS: Speckled Pattern: 24529

## 2020-08-22 LAB — ANTINUCLEAR ANTIBODIES, IFA: ANA Ab, IFA: POSITIVE — AB

## 2020-08-22 MED ORDER — WARFARIN SODIUM 5 MG PO TABS
5.0000 mg | ORAL_TABLET | Freq: Once | ORAL | Status: AC
  Administered 2020-08-22: 5 mg via ORAL
  Filled 2020-08-22: qty 1

## 2020-08-22 NOTE — Progress Notes (Signed)
[facility] for Heparin and resume Warfarin Indication: Mechanical MVR  Allergies  Allergen Reactions  . Oxycodone Itching  . Tape Rash and Other (See Comments)    THE ONLY TAPE THAT IS TOLERATED IS PAPER TAPE. EKG leads inflame the skin    Patient Measurements: Height: 5\' 2"  (157.5 cm) Weight: 51.3 kg (113 lb 1.5 oz) IBW/kg (Calculated) : 50.1  Vital Signs: Temp: 98.2 F (36.8 C) (11/27 0559) Temp Source: Oral (11/27 0559) BP: 129/75 (11/27 0559) Pulse Rate: 88 (11/27 0559)  Labs: Recent Labs    08/20/20 0437 08/20/20 0437 08/20/20 1604 08/20/20 1604 08/21/20 0307 08/22/20 0335  HGB 6.6*   < > 8.7*   < > 8.3* 7.7*  HCT 18.4*   < > 24.6*  --  23.1* 22.3*  PLT 317   < > 295  --  310 308  LABPROT  --   --   --   --   --  14.6  INR  --   --   --   --   --  1.2  HEPARINUNFRC 0.25*   < > 0.29*  --  0.42 0.34  CREATININE 0.73  --   --   --  0.88 0.94   < > = values in this interval not displayed.    Estimated Creatinine Clearance: 52.2 mL/min (by C-G formula based on SCr of 0.94 mg/dL).    Assessment: 76 YOF on Coumadin PTA for history of mechantical MVR. Initially thought to have a GIB, but GI has evaluated patient and states no GI issues currently and they have signed off.  Currently on IV heparin bridge.  Pharmacy consulted 11/26 to resume Warfarin.  Goal INR =2.5-3.5 per coumadin clinic notes.    PTA warfarin dose:  2.5 mg every Sunday and Wed,  5mg  all other days of the week.  Last taken PTA on 11/22.   INR >10 on admission, reversed by Vitamin K 10 mg IVPB x1 on 11/23.  Today's Heparin level remains  therapeutic on heparin 900 units/hr.  HL goal 0.3-0.5 INR is  1.2  post reversal 11/24 due to supra-therapeutic INR on admission.  Resumed Warfarin 11/26 Acute on chronic macrocytic anemia:  MD notes that alcoholism likely contributing.  Hgb 8.7>8.3>7.7, pltc wnl stable.  No overt bleeding reported.    Goal of Therapy:  Goal  INR 2.5-3.5 Heparin level 0.3-0.5 units/ml Monitor platelets by anticoagulation protocol: Yes   Plan:  Continue Heparin gtt 900 units/hr Coumadin 5mg  today x1 Daily heparin level , PT/INR and CBC  Nicole Cella, RPh Clinical Pharmacist [phone] Please check AMION for all [place] phone numbers After 10:00 PM, call [place] [phone]  08/22/2020, 9:08 AM

## 2020-08-22 NOTE — Progress Notes (Signed)
PROGRESS NOTE    Mary [name]  FVC:[id number] DOB: July 28, 1961 DOA: 08/17/2020 PCP: Antony Blackbird, MD  Brief Narrative: 59 year old female with history of mechanical mitral valve and tricuspid repair in 2014 on chronic Coumadin, alcohol abuse, macrocytic anemia, chronic diastolic CHF, chronic erosive gastritis, pyloric stenosis, Called EMS due to multiple complaints namely numbness weakness dizziness etc. -Noted to have peripheral neuropathy and multiple other neurological symptoms -Also long history of alcoholism  Assessment & Plan:   Progressive neuropathy Myalgias -ESR and CRP are within normal limits -Chronic alcoholism suspected to be contributing, nutritional deficiencies also possible -Appreciate neurology input, LP completed 11/24 in IR, CSF preliminary studies are all unremarkable -MRI notes few microhemorrhages -ANA, rheumatoid factor, ACE levels and heavy metal screen are all normal  -Vitamin B1, B6, methylmalonic acid level,  SPEP pending at this time -Continue high-dose thiamine, slow improvement noted -Patient was also taking high doses of gabapentin which is contributing to dizziness, resumed at lower dose -Counseled regarding alcohol cessation -PT OT eval completed SNF recommended for rehabilitation, unfortunately uninsured and does not have benefits for this -Neurology recommended outpatient follow-up with neuromuscular neurology for EMG/nerve conduction studies  History of mechanical mitral valve -Supratherapeutic INR on admission, reversed with vitamin K on admission, now subtherapeutic -Continue IV heparin, Coumadin  Acute on chronic macrocytic anemia -Alcoholism likely contributing, B12 and folate within normal range now, also follow-up MMA -Reticulocyte count is up, follow-up peripheral smear and SPEP -EGD and colonoscopy in August noted pyloric stenosis with some narrowing, negative H. pylori, colonoscopy was negative -No overt bleeding reported, hemoglobin  7.7 today, anemia panel with adequate iron stores -Continue Coumadin with heparin bridge for mechanical mitral valve and monitor  Chronic diastolic CHF -Clinically appears euvolemic, Lasix on hold  Abnormal LFTs -Pattern consistent with alcoholic liver disease  Alcohol abuse -Counseled, not willing to accept this could be contributing to her symptoms, continue high-dose thiamine -No overt withdrawal, counseled  Severe protein calorie malnutrition -With severe hypoalbuminemia, temporal wasting, muscle atrophy, started supplements  DVT prophylaxis: Heparin/Coumadin Code Status: Full code Family Communication: Called and updated daughter Angel Hobdy 11/26 Disposition Plan:  Status is: Inpatient  Remains inpatient appropriate because:Inpatient level of care appropriate due to severity of illness   Dispo: The patient is from: Home              Anticipated d/c is to: Home with home health services              Anticipated d/c date is: 2 to 3 days              Patient currently is not medically stable to d/c.  Consultants:   Neuro   Procedures: Lumbar puncture 11/24  Antimicrobials:    Subjective: -No events overnight, denies any bleeding, continues to have pain, tingles in her legs   Objective: Vitals:   08/21/20 1648 08/21/20 2009 08/22/20 0559 08/22/20 0927  BP: 131/83 136/89 129/75 128/78  Pulse: (!) 101 (!) 103 88 (!) 101  Resp: _0 Temp: 98.4 F (36.9 C) 98.1 F (36.7 C) 98.2 F (36.8 C) 98.7 F (37.1 C)  TempSrc: Oral Oral Oral Oral  SpO2: 100% 100% 97% 100%  Weight:      Height:        Intake/Output Summary (Last 24 hours) at 08/22/2020 1227 Last data filed at 08/22/2020 0600 Gross per 24 hour  Intake 240 ml  Output 2 ml  Net 238 ml   [employer]  08/18/20 1800  Weight: 51.3 kg    Examination:  General exam: Pleasant middle-aged female sitting up in bed, AAOx3 HEENT: Missing front teeth, no JVD CVS: S1-S2, regular rate rhythm,  metallic click Lungs: Clear bilaterally Abdomen: Soft, nontender, bowel sounds present Extremities: No edema  Neuro: Decreased lower extremity strength, 4/5 throughout the right and left, decreased sensation, light touch and pinprick, depressed reflexes  Psych: Flat affect   Data Reviewed:   CBC: Recent Labs  Lab 08/17/20 1605 08/18/20 0004 08/19/20 0430 08/20/20 0437 08/20/20 1604 08/21/20 0307 08/22/20 0335  WBC 5.4  --  6.3 5.1 6.3 5.9 6.4  NEUTROABS 3.7  --   --   --   --   --   --   HGB 9.4*   < > 7.3* 6.6* 8.7* 8.3* 7.7*  HCT 27.0*   < > 20.6* 18.4* 24.6* 23.1* 22.3*  MCV 126.8*  --  122.6* 123.5* 111.3* 113.2* 113.8*  PLT 368  --  318 317 295 310 308   < > = values in this interval not displayed.   Basic Metabolic Panel: Recent Labs  Lab 08/17/20 1605 08/17/20 1605 08/18/20 0004 08/18/20 0810 08/20/20 0437 08/21/20 0307 08/22/20 0335  NA 134*   < > 135 137 139 138 139  K 5.0   < > 3.9 3.5 3.8 3.6 4.1  CL 104  --   --  105 111 109 109  CO2 16*  --   --  21* 22 20* 21*  GLUCOSE 89  --   --  93 91 102* 84  BUN 16  --   --  _0 CREATININE 1.37*  --   --  1.21* 0.73 0.88 0.94  CALCIUM 8.7*  --   --  8.6* 8.1* 7.9* 8.0*   < > = values in this interval not displayed.   GFR: Estimated Creatinine Clearance: 52.2 mL/min (by C-G formula based on SCr of 0.94 mg/dL). Liver Function Tests: Recent Labs  Lab 08/17/20 1605 08/20/20 0437 08/21/20 0307  AST 92* 24 20  ALT 47* 21 18  ALKPHOS 114 70 75  BILITOT 1.3* 0.6 0.8  PROT 5.8* 4.9* 4.6*  ALBUMIN 3.0* 2.3* 2.1*   No results for input(s): LIPASE, AMYLASE in the last 168 hours. Recent Labs  Lab 08/18/20 0131  AMMONIA 49*   Coagulation Profile: Recent Labs  Lab 08/18/20 0131 08/18/20 2152 08/19/20 0430 08/22/20 0335  INR >10.0* 1.4* 1.4* 1.2   Cardiac Enzymes: Recent Labs  Lab 08/18/20 1635  CKTOTAL 39   BNP (last 3 results) No results for input(s): PROBNP in the last 8760  hours. HbA1C: No results for input(s): HGBA1C in the last 72 hours. CBG: No results for input(s): GLUCAP in the last 168 hours. Lipid Profile: No results for input(s): CHOL, HDL, LDLCALC, TRIG, CHOLHDL, LDLDIRECT in the last 72 hours. Thyroid Function Tests: No results for input(s): TSH, T4TOTAL, FREET4, T3FREE, THYROIDAB in the last 72 hours. Anemia Panel: Recent Labs    08/20/20 0437 08/20/20 0805  FERRITIN 826*  --   TIBC  --  151*  IRON  --  103  RETICCTPCT 3.5*  --    Urine analysis:    Component Value Date/Time   COLORURINE YELLOW 04/24/2020 1307   APPEARANCEUR CLEAR 04/24/2020 1307   LABSPEC 1.034 (H) 04/24/2020 1307   PHURINE 7.0 04/24/2020 1307   [place] 04/24/2020 1307   HGBUR MODERATE (A) 04/24/2020 1307   [place] 04/24/2020 1307  KETONESUR NEGATIVE 04/24/2020 1307   PROTEINUR NEGATIVE 04/24/2020 1307   UROBILINOGEN 0.2 06/04/2015 1533   NITRITE NEGATIVE 04/24/2020 1307   LEUKOCYTESUR TRACE (A) 04/24/2020 1307   Sepsis Labs: _0 (procalcitonin:4,lacticidven:4)  ) Recent Results (from the past 240 hour(s))  Respiratory Panel by RT PCR (Flu A&B, Covid) - Nasopharyngeal Swab     Status: None   Collection Time: 08/18/20  9:52 AM   Specimen: Nasopharyngeal Swab; Nasopharyngeal(NP) swabs in vial transport medium  Result Value Ref Range Status   SARS Coronavirus 2 by RT PCR NEGATIVE NEGATIVE Final    Comment: (NOTE) SARS-CoV-2 target nucleic acids are NOT DETECTED.  The SARS-CoV-2 RNA is generally detectable in upper respiratoy specimens during the acute phase of infection. The lowest concentration of SARS-CoV-2 viral copies this assay can detect is 131 copies/mL. A negative result does not preclude SARS-Cov-2 infection and should not be used as the sole basis for treatment or other patient management decisions. A negative result may occur with  improper specimen collection/handling, submission of specimen other than  nasopharyngeal swab, presence of viral mutation(s) within the areas targeted by this assay, and inadequate number of viral copies (<131 copies/mL). A negative result must be combined with clinical observations, patient history, and epidemiological information. The expected result is Negative.  Fact Sheet for Patients:  PinkCheek.be  Fact Sheet for Healthcare Providers:  GravelBags.it  This test is no t yet approved or cleared by the [place] FDA and  has been authorized for detection and/or diagnosis of SARS-CoV-2 by FDA under an Emergency Use Authorization (EUA). This EUA will remain  in effect (meaning this test can be used) for the duration of the COVID-19 declaration under Section 564(b)(1) of the Act, 21 U.S.C. section 360bbb-3(b)(1), unless the authorization is terminated or revoked sooner.     Influenza A by PCR NEGATIVE NEGATIVE Final   Influenza B by PCR NEGATIVE NEGATIVE Final    Comment: (NOTE) The Xpert Xpress SARS-CoV-2/FLU/RSV assay is intended as an aid in  the diagnosis of influenza from Nasopharyngeal swab specimens and  should not be used as a sole basis for treatment. Nasal washings and  aspirates are unacceptable for Xpert Xpress SARS-CoV-2/FLU/RSV  testing.  Fact Sheet for Patients: PinkCheek.be  Fact Sheet for Healthcare Providers: GravelBags.it  This test is not yet approved or cleared by the [place] FDA and  has been authorized for detection and/or diagnosis of SARS-CoV-2 by  FDA under an Emergency Use Authorization (EUA). This EUA will remain  in effect (meaning this test can be used) for the duration of the  Covid-19 declaration under Section 564(b)(1) of the Act, 21  U.S.C. section 360bbb-3(b)(1), unless the authorization is  terminated or revoked. Performed at [facility], [place] [address], [place],  [place] [address]   CSF culture     Status: None (Preliminary result)   Collection Time: 08/19/20  4:53 PM   Specimen: PATH Cytology CSF; Cerebrospinal Fluid  Result Value Ref Range Status   Specimen Description CSF  Final   Special Requests NONE  Final   Gram Stain   Final    WBC PRESENT, PREDOMINANTLY MONONUCLEAR NO ORGANISMS SEEN CYTOSPIN SMEAR    Culture   Final    NO GROWTH 3 DAYS Performed at [facility], [place] [address] [place], [place] [address]    Report Status PENDING  Incomplete         Radiology Studies: No results found. Scheduled Meds: . aspirin EC  81  mg Oral Daily  . gabapentin  100 mg Oral BID  . gabapentin  300 mg Oral QHS  . magic mouthwash w/lidocaine  10 mL Oral QID  . pantoprazole  40 mg Oral Daily  . potassium chloride SA  20 mEq Oral Daily  . [START ON 08/27/2020] thiamine injection  100 mg Intravenous Daily  . vitamin B-12  100 mcg Oral Daily  . warfarin  5 mg Oral ONCE-1600  . Warfarin - Pharmacist Dosing Inpatient   Does not apply q1600   Continuous Infusions: . heparin 900 Units/hr (08/22/20 0729)  . thiamine injection 250 mg (08/22/20 1107)     LOS: 4 days   Time spent: 55mn  PDomenic Polite MD [facility]  08/22/2020, 12:27 PM

## 2020-08-23 LAB — CBC
HCT: 24.6 % — ABNORMAL LOW (ref 36.0–46.0)
Hemoglobin: 8.4 g/dL — ABNORMAL LOW (ref 12.0–15.0)
MCH: 39.1 pg — ABNORMAL HIGH (ref 26.0–34.0)
MCHC: 34.1 g/dL (ref 30.0–36.0)
MCV: 114.4 fL — ABNORMAL HIGH (ref 80.0–100.0)
Platelets: 338 10*3/uL (ref 150–400)
RBC: 2.15 MIL/uL — ABNORMAL LOW (ref 3.87–5.11)
WBC: 6.3 10*3/uL (ref 4.0–10.5)
nRBC: 0 % (ref 0.0–0.2)

## 2020-08-23 LAB — COMPREHENSIVE METABOLIC PANEL
ALT: 17 U/L (ref 0–44)
AST: 18 U/L (ref 15–41)
Albumin: 2.3 g/dL — ABNORMAL LOW (ref 3.5–5.0)
Alkaline Phosphatase: 65 U/L (ref 38–126)
Anion gap: 10 (ref 5–15)
BUN: 6 mg/dL (ref 6–20)
CO2: 21 mmol/L — ABNORMAL LOW (ref 22–32)
Calcium: 8.5 mg/dL — ABNORMAL LOW (ref 8.9–10.3)
Chloride: 110 mmol/L (ref 98–111)
Creatinine, Ser: 0.85 mg/dL (ref 0.44–1.00)
GFR, Estimated: >60 mL/min (ref >60)
Glucose, Bld: 82 mg/dL (ref 70–99)
Potassium: 4 mmol/L (ref 3.5–5.1)
Sodium: 141 mmol/L (ref 135–145)
Total Bilirubin: 0.6 mg/dL (ref 0.3–1.2)
Total Protein: 4.9 g/dL — ABNORMAL LOW (ref 6.5–8.1)

## 2020-08-23 LAB — PROTIME-INR
INR: 1.2 (ref 0.8–1.2)
Prothrombin Time: 15.2 seconds (ref 11.4–15.2)

## 2020-08-23 LAB — CSF CULTURE W GRAM STAIN: Culture: NO GROWTH

## 2020-08-23 LAB — HEPARIN LEVEL (UNFRACTIONATED): Heparin Unfractionated: 0.34 IU/mL (ref 0.30–0.70)

## 2020-08-23 MED ORDER — WARFARIN SODIUM 5 MG PO TABS
5.0000 mg | ORAL_TABLET | Freq: Once | ORAL | Status: AC
  Administered 2020-08-23: 5 mg via ORAL
  Filled 2020-08-23: qty 1

## 2020-08-23 MED ORDER — SENNOSIDES-DOCUSATE SODIUM 8.6-50 MG PO TABS
1.0000 | ORAL_TABLET | Freq: Two times a day (BID) | ORAL | Status: DC
  Administered 2020-08-23 – 2020-08-24 (×3): 1 via ORAL
  Filled 2020-08-23 (×3): qty 1

## 2020-08-23 NOTE — Progress Notes (Signed)
PROGRESS NOTE    Mary [name]  JJH:[id number] DOB: 07-Dec-1960 DOA: 08/17/2020 PCP: Antony Blackbird, MD  Brief Narrative: 59 year old female with history of mechanical mitral valve and tricuspid repair in 2014 on chronic Coumadin, alcohol abuse, macrocytic anemia, chronic diastolic CHF, chronic erosive gastritis, pyloric stenosis, Called EMS due to multiple complaints namely numbness weakness dizziness etc. -Noted to have peripheral neuropathy and multiple other neurological symptoms -Also long history of alcoholism  Assessment & Plan:   Progressive neuropathy Gait disorder -Chronic alcoholism suspected to be contributing, nutritional deficiencies also possible -Appreciate neurology input, LP completed 11/24 in IR, CSF preliminary studies are all unremarkable -MRI notes few microhemorrhages -ANA, rheumatoid factor, ACE levels and heavy metal screen are all normal  -Vitamin B1, B6, methylmalonic acid level,  SPEP pending at this time -Continue high-dose thiamine, slow improvement noted -Patient was also taking high doses of gabapentin which is contributing to dizziness, resumed at lower dose -Counseled regarding alcohol cessation -PT OT eval completed SNF recommended for rehabilitation, unfortunately uninsured and does not have benefits for this -Neurology recommended outpatient follow-up with neuromuscular neurology for EMG/nerve conduction studies, will complete ambulatory referral prior to discharge  History of mechanical mitral valve -Supratherapeutic INR on admission, reversed with vitamin K on admission, now subtherapeutic -Continue IV heparin, Coumadin  Acute on chronic macrocytic anemia -Alcoholism likely contributing, B12 and folate within normal range now, also follow-up MMA -Reticulocyte count is up, follow-up peripheral smear and SPEP -EGD and colonoscopy in August noted pyloric stenosis with some narrowing, negative H. pylori, colonoscopy was negative -No overt bleeding  reported, hemoglobin 8.4 today, anemia panel with adequate iron stores -Continue Coumadin with heparin bridge for mechanical mitral valve and monitor  Chronic diastolic CHF -Clinically appears euvolemic, Lasix on hold  Abnormal LFTs -Pattern consistent with alcoholic liver disease  Alcohol abuse -Counseled, not willing to accept this could be contributing to her symptoms, continue high-dose thiamine -No overt withdrawal, counseled  Severe protein calorie malnutrition -With severe hypoalbuminemia, temporal wasting, muscle atrophy, started supplements  DVT prophylaxis: Heparin/Coumadin Code Status: Full code Family Communication: Called and updated daughter Jaelie Aguilera 11/26 Disposition Plan:  Status is: Inpatient  Remains inpatient appropriate because:Inpatient level of care appropriate due to severity of illness   Dispo: The patient is from: Home              Anticipated d/c is to: Home with home health services              Anticipated d/c date is: Likely 2 days              Patient currently is not medically stable to d/c.  Consultants:   Neuro   Procedures: Lumbar puncture 11/24  Antimicrobials:    Subjective: -No events overnight, continues to have pain and tingles in her legs, and some discomfort   Objective: Vitals:   08/22/20 1642 08/22/20 2141 08/23/20 0557 08/23/20 0930  BP: (!) 146/93 135/87 128/68 137/72  Pulse: 89 100 89 96  Resp: 18 18 16 18   Temp: 98.9 F (37.2 C) 98.6 F (37 C) 98.5 F (36.9 C) 98.3 F (36.8 C)  TempSrc: Oral Oral Oral Oral  SpO2: 100% 100% 98% 100%  Weight:      Height:        Intake/Output Summary (Last 24 hours) at 08/23/2020 1110 Last data filed at 08/23/2020 0600 Gross per 24 hour  Intake 683.96 ml  Output 50 ml  Net 633.96 ml   [employer]  08/18/20 1800  Weight: 51.3 kg    Examination:  Pleasant middle-aged female sitting up in bed, AAOx3, no distress HEENT: Missing front teeth, no JVD CVS: S1-S2,  regular rate rhythm, metallic click Lungs: Clear bilaterally Abdomen: Soft, nontender, bowel sounds present Extremities: No edema Neuro: Decreased lower extremity strength, 4/5, decreased sensation light touch and pinprick in both lower legs, depressed reflexes Psych: Flat affect   Data Reviewed:   CBC: Recent Labs  Lab 08/17/20 1605 08/18/20 0004 08/20/20 0437 08/20/20 1604 08/21/20 0307 08/22/20 0335 08/23/20 0253  WBC 5.4   < > 5.1 6.3 5.9 6.4 6.3  NEUTROABS 3.7  --   --   --   --   --   --   HGB 9.4*   < > 6.6* 8.7* 8.3* 7.7* 8.4*  HCT 27.0*   < > 18.4* 24.6* 23.1* 22.3* 24.6*  MCV 126.8*   < > 123.5* 111.3* 113.2* 113.8* 114.4*  PLT 368   < > 317 295 310 308 338   < > = values in this interval not displayed.   Basic Metabolic Panel: Recent Labs  Lab 08/18/20 0810 08/20/20 0437 08/21/20 0307 08/22/20 0335 08/23/20 0253  NA 137 139 138 139 141  K 3.5 3.8 3.6 4.1 4.0  CL 105 111 109 109 110  CO2 21* 22 20* 21* 21*  GLUCOSE 93 91 102* 84 82  BUN 13 6 6 6 6   CREATININE 1.21* 0.73 0.88 0.94 0.85  CALCIUM 8.6* 8.1* 7.9* 8.0* 8.5*   GFR: Estimated Creatinine Clearance: 57.8 mL/min (by C-G formula based on SCr of 0.85 mg/dL). Liver Function Tests: Recent Labs  Lab 08/17/20 1605 08/20/20 0437 08/21/20 0307 08/23/20 0253  AST 92* 24 20 18   ALT 47* 21 18 17   ALKPHOS 114 70 75 65  BILITOT 1.3* 0.6 0.8 0.6  PROT 5.8* 4.9* 4.6* 4.9*  ALBUMIN 3.0* 2.3* 2.1* 2.3*   No results for input(s): LIPASE, AMYLASE in the last 168 hours. Recent Labs  Lab 08/18/20 0131  AMMONIA 49*   Coagulation Profile: Recent Labs  Lab 08/18/20 0131 08/18/20 2152 08/19/20 0430 08/22/20 0335 08/23/20 0253  INR >10.0* 1.4* 1.4* 1.2 1.2   Cardiac Enzymes: Recent Labs  Lab 08/18/20 1635  CKTOTAL 39   BNP (last 3 results) No results for input(s): PROBNP in the last 8760 hours. HbA1C: No results for input(s): HGBA1C in the last 72 hours. CBG: No results for input(s): GLUCAP  in the last 168 hours. Lipid Profile: No results for input(s): CHOL, HDL, LDLCALC, TRIG, CHOLHDL, LDLDIRECT in the last 72 hours. Thyroid Function Tests: No results for input(s): TSH, T4TOTAL, FREET4, T3FREE, THYROIDAB in the last 72 hours. Anemia Panel: No results for input(s): VITAMINB12, FOLATE, FERRITIN, TIBC, IRON, RETICCTPCT in the last 72 hours. Urine analysis:    Component Value Date/Time   COLORURINE YELLOW 04/24/2020 1307   APPEARANCEUR CLEAR 04/24/2020 1307   LABSPEC 1.034 (H) 04/24/2020 1307   PHURINE 7.0 04/24/2020 1307   GLUCOSEU NEGATIVE 04/24/2020 1307   HGBUR MODERATE (A) 04/24/2020 1307   BILIRUBINUR NEGATIVE 04/24/2020 1307   KETONESUR NEGATIVE 04/24/2020 1307   PROTEINUR NEGATIVE 04/24/2020 1307   UROBILINOGEN 0.2 06/04/2015 1533   NITRITE NEGATIVE 04/24/2020 1307   LEUKOCYTESUR TRACE (A) 04/24/2020 1307   Sepsis Labs: @LABRCNTIP (procalcitonin:4,lacticidven:4)  ) Recent Results (from the past 240 hour(s))  Respiratory Panel by RT PCR (Flu A&B, Covid) - Nasopharyngeal Swab     Status: None   Collection Time: 08/18/20  9:52  AM   Specimen: Nasopharyngeal Swab; Nasopharyngeal(NP) swabs in vial transport medium  Result Value Ref Range Status   SARS Coronavirus 2 by RT PCR NEGATIVE NEGATIVE Final    Comment: (NOTE) SARS-CoV-2 target nucleic acids are NOT DETECTED.  The SARS-CoV-2 RNA is generally detectable in upper respiratoy specimens during the acute phase of infection. The lowest concentration of SARS-CoV-2 viral copies this assay can detect is 131 copies/mL. A negative result does not preclude SARS-Cov-2 infection and should not be used as the sole basis for treatment or other patient management decisions. A negative result may occur with  improper specimen collection/handling, submission of specimen other than nasopharyngeal swab, presence of viral mutation(s) within the areas targeted by this assay, and inadequate number of viral copies (<131  copies/mL). A negative result must be combined with clinical observations, patient history, and epidemiological information. The expected result is Negative.  Fact Sheet for Patients:  PinkCheek.be  Fact Sheet for Healthcare Providers:  GravelBags.it  This test is no t yet approved or cleared by the [place] FDA and  has been authorized for detection and/or diagnosis of SARS-CoV-2 by FDA under an Emergency Use Authorization (EUA). This EUA will remain  in effect (meaning this test can be used) for the duration of the COVID-19 declaration under Section 564(b)(1) of the Act, 21 U.S.C. section 360bbb-3(b)(1), unless the authorization is terminated or revoked sooner.     Influenza A by PCR NEGATIVE NEGATIVE Final   Influenza B by PCR NEGATIVE NEGATIVE Final    Comment: (NOTE) The Xpert Xpress SARS-CoV-2/FLU/RSV assay is intended as an aid in  the diagnosis of influenza from Nasopharyngeal swab specimens and  should not be used as a sole basis for treatment. Nasal washings and  aspirates are unacceptable for Xpert Xpress SARS-CoV-2/FLU/RSV  testing.  Fact Sheet for Patients: PinkCheek.be  Fact Sheet for Healthcare Providers: GravelBags.it  This test is not yet approved or cleared by the [place] FDA and  has been authorized for detection and/or diagnosis of SARS-CoV-2 by  FDA under an Emergency Use Authorization (EUA). This EUA will remain  in effect (meaning this test can be used) for the duration of the  Covid-19 declaration under Section 564(b)(1) of the Act, 21  U.S.C. section 360bbb-3(b)(1), unless the authorization is  terminated or revoked. Performed at [facility], [place] [address] [place], [place] [address]   CSF culture     Status: None   Collection Time: 08/19/20  4:53 PM   Specimen: PATH Cytology CSF; Cerebrospinal Fluid  Result Value  Ref Range Status   Specimen Description CSF  Final   Special Requests NONE  Final   Gram Stain   Final    WBC PRESENT, PREDOMINANTLY MONONUCLEAR NO ORGANISMS SEEN CYTOSPIN SMEAR    Culture   Final    NO GROWTH 3 DAYS Performed at [facility], [place] [address] [place], [place] [address]    Report Status 08/23/2020 FINAL  Final    Radiology Studies: No results found. Scheduled Meds:  gabapentin  100 mg Oral BID   gabapentin  300 mg Oral QHS   magic mouthwash w/lidocaine  10 mL Oral QID   pantoprazole  40 mg Oral Daily   senna-docusate  1 tablet Oral BID   [START ON 08/27/2020] thiamine injection  100 mg Intravenous Daily   vitamin B-12  100 mcg Oral Daily   warfarin  5 mg Oral ONCE-1600   Warfarin - Pharmacist Dosing Inpatient  Does not apply q1600   Continuous Infusions:  heparin 900 Units/hr (08/22/20 0729)   thiamine injection 250 mg (08/23/20 1003)     LOS: 5 days   Time spent: 81min  Domenic Polite, MD [facility]  08/23/2020, 11:10 AM

## 2020-08-23 NOTE — Plan of Care (Signed)
  Problem: Education: Goal: Knowledge of General Education information will improve Description Including pain rating scale, medication(s)/side effects and non-pharmacologic comfort measures Outcome: Progressing   

## 2020-08-23 NOTE — Discharge Instructions (Signed)

## 2020-08-23 NOTE — Progress Notes (Signed)
[facility] for Heparin and resume Warfarin Indication: Mechanical MVR  Allergies  Allergen Reactions  . Oxycodone Itching  . Tape Rash and Other (See Comments)    THE ONLY TAPE THAT IS TOLERATED IS PAPER TAPE. EKG leads inflame the skin    Patient Measurements: Height: 5\' 2"  (157.5 cm) Weight: 51.3 kg (113 lb 1.5 oz) IBW/kg (Calculated) : 50.1  Vital Signs: Temp: 98.5 F (36.9 C) (11/28 0557) Temp Source: Oral (11/28 0557) BP: 128/68 (11/28 0557) Pulse Rate: 89 (11/28 0557)  Labs: Recent Labs    08/21/20 0307 08/21/20 0307 08/22/20 0335 08/23/20 0253  HGB 8.3*   < > 7.7* 8.4*  HCT 23.1*  --  22.3* 24.6*  PLT 310  --  308 338  LABPROT  --   --  14.6 15.2  INR  --   --  1.2 1.2  HEPARINUNFRC 0.42  --  0.34 0.34  CREATININE 0.88  --  0.94 0.85   < > = values in this interval not displayed.    Estimated Creatinine Clearance: 57.8 mL/min (by C-G formula based on SCr of 0.85 mg/dL).    Assessment: 19 YOF on Coumadin PTA for history of mechantical MVR. Initially thought to have a GIB, but GI has evaluated patient and states no GI issues currently and they have signed off.  Currently on IV heparin bridge.  Pharmacy consulted 11/26 to resume Warfarin.  Goal INR =2.5-3.5 per coumadin clinic notes.    PTA warfarin dose:  2.5 mg every Sunday and Wed,  5mg  all other days of the week.  Last taken PTA on 11/22.   INR >10 on admission, reversed by Vitamin K 10 mg IVPB x1 on 11/23.  Today's Heparin level remains  therapeutic on heparin 900 units/hr.  HL goal 0.3-0.5 INR is  1.2 today, Goal 2.5-3.5 for mechanical MVR. INR trend: INR >10 on admit >reversed w/Vit K 10 mg IV on 11/24>  1.4> warfarin resumed 11/26> INR 1.2>1.2   Resumed Warfarin 11/26 Acute on chronic macrocytic anemia,   MD notes that alcoholism likely contributing.   Hgb 8.7>8.3>7.7>8.4, pltc wnl stable.  No overt bleeding reported.  -not eating much per Epic charting   Goal  of Therapy:  Goal INR 2.5-3.5 Heparin level 0.3-0.5 units/ml Monitor platelets by anticoagulation protocol: Yes   Plan:  Continue Heparin gtt 900 units/hr Coumadin 5mg  today x1 Daily heparin level , PT/INR and CBC  Nicole Cella, RPh Clinical Pharmacist [phone] Please check AMION for all [facility] phone numbers After 10:00 PM, call [facility] [phone]  08/23/2020, 9:07 AM

## 2020-08-24 ENCOUNTER — Other Ambulatory Visit (HOSPITAL_COMMUNITY): Payer: Self-pay | Admitting: Internal Medicine

## 2020-08-24 DIAGNOSIS — R200 Anesthesia of skin: Secondary | ICD-10-CM

## 2020-08-24 LAB — BPAM RBC
Blood Product Expiration Date: 202112022359
Blood Product Expiration Date: 202112252359
ISSUE DATE / TIME: 202111250923
Unit Type and Rh: 5100
Unit Type and Rh: 9500

## 2020-08-24 LAB — TYPE AND SCREEN
ABO/RH(D): O POS
Antibody Screen: NEGATIVE
Donor AG Type: NEGATIVE
Donor AG Type: NEGATIVE
Unit division: 0
Unit division: 0

## 2020-08-24 LAB — CBC
HCT: 23.3 % — ABNORMAL LOW (ref 36.0–46.0)
Hemoglobin: 8.1 g/dL — ABNORMAL LOW (ref 12.0–15.0)
MCH: 40.1 pg — ABNORMAL HIGH (ref 26.0–34.0)
MCHC: 34.8 g/dL (ref 30.0–36.0)
MCV: 115.3 fL — ABNORMAL HIGH (ref 80.0–100.0)
Platelets: 347 10*3/uL (ref 150–400)
RBC: 2.02 MIL/uL — ABNORMAL LOW (ref 3.87–5.11)
WBC: 5.8 10*3/uL (ref 4.0–10.5)
nRBC: 0 % (ref 0.0–0.2)

## 2020-08-24 LAB — IGG CSF INDEX
Albumin CSF-mCnc: 9 mg/dL (ref 8–37)
Albumin: 2.9 g/dL — ABNORMAL LOW (ref 3.8–4.9)
CSF IgG Index: 0.5 (ref 0.0–0.7)
IgG (Immunoglobin G), Serum: 1190 mg/dL (ref 586–1602)
IgG, CSF: 2 mg/dL (ref 0.0–6.7)
IgG/Alb Ratio, CSF: 0.22 (ref 0.00–0.25)

## 2020-08-24 LAB — PROTIME-INR
INR: 1.4 — ABNORMAL HIGH (ref 0.8–1.2)
Prothrombin Time: 16.2 seconds — ABNORMAL HIGH (ref 11.4–15.2)

## 2020-08-24 LAB — HEPARIN LEVEL (UNFRACTIONATED): Heparin Unfractionated: 0.52 IU/mL (ref 0.30–0.70)

## 2020-08-24 MED ORDER — ENOXAPARIN SODIUM 40 MG/0.4ML ~~LOC~~ SOLN: SUBCUTANEOUS | 0 refills | Status: DC

## 2020-08-24 MED ORDER — ACETAMINOPHEN 500 MG PO TABS
500.0000 mg | ORAL_TABLET | Freq: Four times a day (QID) | ORAL | 0 refills | Status: DC | PRN
Start: 2020-08-24 — End: 2021-01-30

## 2020-08-24 MED ORDER — GABAPENTIN 300 MG PO CAPS: 300.0000 mg | ORAL_CAPSULE | Freq: Every day | ORAL | 0 refills | Status: DC

## 2020-08-24 MED ORDER — VITAMIN B12 500 MCG PO TABS
500.0000 ug | ORAL_TABLET | Freq: Every day | ORAL | 0 refills | Status: DC
Start: 2020-08-25 — End: 2020-12-24

## 2020-08-24 MED ORDER — WARFARIN SODIUM 5 MG PO TABS: ORAL_TABLET | ORAL | Status: DC

## 2020-08-24 MED ORDER — THIAMINE HCL 100 MG PO TABS: 100.0000 mg | ORAL_TABLET | Freq: Every day | ORAL | 0 refills | Status: DC

## 2020-08-24 MED ORDER — WARFARIN SODIUM 7.5 MG PO TABS: 7.5000 mg | ORAL_TABLET | Freq: Once | ORAL | Status: DC

## 2020-08-24 MED ORDER — ENOXAPARIN SODIUM 60 MG/0.6ML ~~LOC~~ SOLN: 50.0000 mg | Freq: Two times a day (BID) | SUBCUTANEOUS | Status: DC

## 2020-08-24 MED ORDER — METOPROLOL SUCCINATE ER 25 MG PO TB24
12.5000 mg | ORAL_TABLET | Freq: Every day | ORAL | Status: DC
  Administered 2020-08-24: 12.5 mg via ORAL
  Filled 2020-08-24: qty 1

## 2020-08-24 MED ORDER — ENOXAPARIN SODIUM 60 MG/0.6ML ~~LOC~~ SOLN
50.0000 mg | Freq: Once | SUBCUTANEOUS | Status: AC
  Administered 2020-08-24: 50 mg via SUBCUTANEOUS
  Filled 2020-08-24: qty 0.6

## 2020-08-24 MED FILL — ENOXAPARIN SODIUM 60 MG/0.6: 60 | 3 days supply | Qty: 4 | Fill #0

## 2020-08-24 MED FILL — VITAMIN B-12 1000 MCG TABS: 1000 | 60 days supply | Qty: 60 | Fill #0

## 2020-08-24 MED FILL — VITAMIN B-1 100 MG TABS: 100 | 30 days supply | Qty: 30 | Fill #0

## 2020-08-24 NOTE — Progress Notes (Signed)
Physical Therapy Treatment Patient Details Name: Mary [name] MRN: [id number] DOB: 1961/07/26 Today's Date: 08/24/2020    History of Present Illness Pt is a 59 y/o female admitted secondary to weakness and dizziness. Also reporting increased parethesias in face and all over body. MRI showed multiple punctuate hemorrhages. PMH includes HTN, s/p MVR, and CHF.     PT Comments    Patient now with plans for home today with family support.  Her daughter here from [place] to help her home and to figure out plans and states her son will be with her at home.  Able to educate on stairs and practice with daughter assist since will need to negotiate for home entry.  Pt. Still weak and needing assist due to decreased balance and coordination and with R knee pain.  Hopeful for follow up PT via HHPT or outpatient if can have transportation.  Pt. For d/c home so will defer follow up to d/c venue.    Follow Up Recommendations  Supervision/Assistance - 24 hour;Home health PT     Equipment Recommendations  Rolling walker with 5" wheels    Recommendations for Other Services       Precautions / Restrictions Precautions Precautions: Fall    Mobility  Bed Mobility Overal bed mobility: Needs Assistance Bed Mobility: Supine to Sit     Supine to sit: Supervision        Transfers Overall transfer level: Needs assistance Equipment used: None Transfers: Sit to/from Stand Sit to Stand: Min guard         General transfer comment: assist for balance, safety  Ambulation/Gait Ambulation/Gait assistance: Min assist;Min guard Gait Distance (Feet): 80 Feet Assistive device: Rolling walker (2 wheeled) Gait Pattern/deviations: Step-through pattern;Decreased stride length;Narrow base of support     General Gait Details: R knee buckled x 1 min A for safety, minguard at times, but LE incoordination present   Stairs Stairs: Yes Stairs assistance: Min assist Stair Management: One rail Right;With  cane;Step to pattern;Forwards Number of Stairs: 5 General stair comments: assist for balance, rising up onto step and cues throughout for sequence.  Daughter from [place] present to take pt home and performed x 1 step to ascent and x 1 step to descend to demonstrate safe guarding technique   Wheelchair Mobility    Modified Rankin (Stroke Patients Only)       Balance Overall balance assessment: Needs assistance Sitting-balance support: Feet supported;No upper extremity supported Sitting balance-Leahy Scale: Good     Standing balance support: Bilateral upper extremity supported;Single extremity supported Standing balance-Leahy Scale: Poor Standing balance comment: at least 1 UE support needed for balance                            Cognition Arousal/Alertness: Awake/alert Behavior During Therapy: WFL for tasks assessed/performed Overall Cognitive Status: Within Functional Limits for tasks assessed                                        Exercises      General Comments General comments (skin integrity, edema, etc.): Issued handout on stair negotiation technique.  Also educated daughter on guarding during ambulation with RW for safety and fall prevention      Pertinent Vitals/Pain Pain Assessment: Faces Faces Pain Scale: Hurts even more Pain Location: R knee Pain Descriptors / Indicators: Aching Pain Intervention(s): Monitored during  session;Repositioned    Home Living                      Prior Function            PT Goals (current goals can now be found in the care plan section) Progress towards PT goals: Progressing toward goals    Frequency    Min 2X/week      PT Plan Discharge plan needs to be updated    Co-evaluation              AM-PAC PT "6 Clicks" Mobility   Outcome Measure  Help needed turning from your back to your side while in a flat bed without using bedrails?: A Little Help needed moving from lying on  your back to sitting on the side of a flat bed without using bedrails?: A Little Help needed moving to and from a bed to a chair (including a wheelchair)?: A Little Help needed standing up from a chair using your arms (e.g., wheelchair or bedside chair)?: A Little Help needed to walk in hospital room?: A Little Help needed climbing 3-5 steps with a railing? : A Little 6 Click Score: 18    End of Session   Activity Tolerance: Patient tolerated treatment well Patient left: in bed;with family/visitor present;with call bell/phone within reach (seated on EOB)   PT Visit Diagnosis: Other abnormalities of gait and mobility (R26.89);Muscle weakness (generalized) (M62.81)     Time: 1245-8099 PT Time Calculation (min) (ACUTE ONLY): 24 min  Charges:  $Gait Training: 8-22 mins $Self Care/Home Management: [place] IPJAS:[phone] Office:[phone] 08/24/2020    Reginia Naas 08/24/2020, 5:34 PM

## 2020-08-24 NOTE — Discharge Summary (Signed)
Physician Discharge Summary  Mary [name] WHQ:[id number] DOB: [birth year] DOA: 08/17/2020  PCP: Antony Blackbird, MD  Admit date: 08/17/2020 Discharge date: 08/24/2020  Time spent: 26minutes  Recommendations for Outpatient Follow-up:  [facility] in 2 weeks, needs EMG/nerve conduction studies PCP Dr. Chapman Fitch in 1 week, needs ophthalmology and dental referral and, please monitor anemia/CBC INR check on Thursday 12/2, discontinue Lovenox if INR greater than 2.5  Discharge Diagnoses:  Active Problems: Progressive neuropathy and myopathy Alcoholism Mechanical mitral valve Chronic macrocytic anemia   (HFpEF) heart failure with preserved ejection fraction (HCC)   Supratherapeutic INR   Elevated liver enzymes   Acute kidney injury ([place])   GERD (gastroesophageal reflux disease)   Paresthesia   Numbness   Discharge Condition: Stable  Diet recommendation: Low-sodium, heart healthy  Filed Weights   08/18/20 1800  Weight: 51.3 kg    History of present illness:  59 year old female with history of mechanical mitral valve and tricuspid repair in 2014 on chronic Coumadin, alcohol abuse, macrocytic anemia, chronic diastolic CHF, chronic erosive gastritis, pyloric stenosis, Called EMS due to multiple complaints namely numbness weakness dizziness etc. -Noted to have peripheral neuropathy and multiple other neurological symptoms  Hospital Course:   Progressive neuropathy/myopathy Gait disorder -Chronic symptoms progressively worsening prior to admission, chronic alcoholism suspected to be contributing, nutritional deficiencies also possible -Neurology consulted, CT head was unremarkable, MRI noted few small microhemorrhages which was not felt to be significant, spinal tap also completed in radiology on 11/24 CSF preliminary studies are all unremarkable -Rheumatoid factor, ACE levels and heavy metal screen are all normal  -Vitamin B12 was normal, Vitamin B1, B6, methylmalonic acid level  are pending, SPEP is normal -Neurology suspected that most of her symptoms could be from alcoholism however recommended to follow-up on above studies and to follow-up with lumbar neurology for EMG/nerve conduction studies as outpatient -Treated with high-dose thiamine and vitamin B12 inpatient, continue thiamine and vitamin B12 at the time of discharge -Patient was also taking high doses of gabapentin which is contributing to dizziness, resumed at lower dose -Counseled regarding alcohol cessation -PT OT eval completed SNF recommended for rehabilitation, unfortunately uninsured and does not have benefits for this -Referral sent to [facility], [facility] home [facility] physical therapy requested  History of mechanical mitral valve -Supratherapeutic INR, >10 on admission, reversed with vitamin K on admission, then remains subtherapeutic, started on heparin bridge, transition to Lovenox bridge with Coumadin at the time of discharge, INR 1.4, discharged on Lovenox 50 mg subcu twice daily for 3 to 4 days until INR greater than 2.5 -Next INR check on Thursday 12/2  Acute on chronic macrocytic anemia -Alcoholism likely contributing, B12 and folate within normal range now -Reticulocyte count is up, SPEP is normal, peripheral smear is pending at the time -EGD and colonoscopy in August noted pyloric stenosis with some narrowing, negative H. pylori, colonoscopy was negative -No overt bleeding reported, hemoglobin 8.4 today, anemia panel with adequate iron stores -Coumadin continued, discharged on Lovenox bridge until INR therapeutic -PCP to monitor anemia  Chronic diastolic CHF -Clinically euvolemic throughout this admission, Lasix discontinued  Abnormal LFTs -Pattern consistent with alcoholic liver disease  Alcohol abuse -Counseled, not willing to accept this could be contributing to her symptoms, continue high-dose thiamine -No overt withdrawal, counseled  Severe protein calorie  malnutrition -With severe hypoalbuminemia, temporal wasting, muscle atrophy, started supplements  Discharge Exam: Vitals:   08/24/20 0643 08/24/20 0857  BP: (!) 127/95 (!) 148/86  Pulse: 92 (!) 102  Resp:  18 18  Temp: 97.7 F (36.5 C) 97.9 F (36.6 C)  SpO2: 100% 100%    General: Awake alert oriented x3 Cardiovascular: S1-S2, regular rate rhythm Respiratory: Clear  Discharge Instructions   Discharge Instructions    Ambulatory referral to Neurology   Complete by: As directed    An appointment is requested in approximately:2 weeks     Allergies as of 08/24/2020      Reactions   Oxycodone Itching   Tape Rash, Other (See Comments)   THE ONLY TAPE THAT IS TOLERATED IS PAPER TAPE. EKG leads inflame the skin      Medication List    STOP taking these medications   aspirin EC 81 MG tablet   furosemide 40 MG tablet Commonly known as: LASIX   potassium chloride SA 20 MEQ tablet Commonly known as: KLOR-CON     TAKE these medications   acetaminophen 500 MG tablet Commonly known as: TYLENOL Take 1 tablet (500 mg total) by mouth every 6 (six) hours as needed for mild pain or headache. What changed:   how much to take  reasons to take this   CLEAR EYES COMPLETE OP Place 2 drops into both eyes daily as needed (red eyes).   enoxaparin 40 MG/0.4ML injection Commonly known as: LOVENOX Take 50mg  Subcutaneous twice daily for 3days and then based on INR check on Thurday 12/2, stop lovenox when WUJ>8.1   folic acid 1 MG tablet Commonly known as: FOLVITE Take 1 mg by mouth daily.   gabapentin 100 MG capsule Commonly known as: NEURONTIN Take 1 capsule (100 mg total) by mouth 2 (two) times daily. And continue 300 mg at bedtime What changed: Another medication with the same name was changed. Make sure you understand how and when to take each.   gabapentin 300 MG capsule Commonly known as: NEURONTIN Take 1 capsule (300 mg total) by mouth at bedtime. As needed for nerve  pain What changed:   when to take this  additional instructions   pantoprazole 40 MG tablet Commonly known as: PROTONIX Take 1 tablet (40 mg total) by mouth daily.   thiamine 100 MG tablet Take 1 tablet (100 mg total) by mouth daily.   Vitamin B12 500 MCG Tabs Take 500 mcg by mouth daily. Start taking on: August 25, 2020   Vitamin D (Ergocalciferol) 1.25 MG (50000 UNIT) Caps capsule Commonly known as: DRISDOL Take 1 capsule (50,000 Units total) by mouth every 7 (seven) days. What changed: when to take this   warfarin 5 MG tablet Commonly known as: COUMADIN Take as directed. If you are unsure how to take this medication, talk to your nurse or doctor. Original instructions: Take 7.5mg  daily for 3days then based on INR as directed by Coumadin clinic What changed: additional instructions            Durable Medical Equipment  (From admission, onward)         Start     Ordered   08/24/20 1221  For home use only DME Walker rolling  Once       Question Answer Comment  Walker: With [place]   Patient needs a walker to treat with the following condition Decreased functional mobility and endurance      08/24/20 1221   08/24/20 1221  For home use only DME 3 n 1  Once        08/24/20 1221         Allergies  Allergen Reactions  .  Oxycodone Itching  . Tape Rash and Other (See Comments)    THE ONLY TAPE THAT IS TOLERATED IS PAPER TAPE. EKG leads inflame the skin    Follow-up Information    [facility] Follow up.   Specialty: [place] Why: the office will call to schedule visits Contact information: [place] [address] [phone]        [facility] Follow up in 2 week(s).   Why: Office will call you Contact information: [place], [place] [phone]       Antony Blackbird, MD. Schedule an appointment as soon as possible for a visit in 1 week(s).    Specialty: Family Medicine Why: get an appointment to see an Ophthalmologist and a Dentist Contact information: [place] [address] [phone]        Jettie Booze, MD .   Specialties: Cardiology, Radiology, Interventional Cardiology Contact information: [address] [place] [address] [phone]                The results of significant diagnostics from this hospitalization (including imaging, microbiology, ancillary and laboratory) are listed below for reference.    Significant Diagnostic Studies: DG Thoracic Spine 2 View  Result Date: 08/14/2020 CLINICAL DATA:  Status post fall. EXAM: THORACIC SPINE 2 VIEWS COMPARISON:  None. FINDINGS: There is no evidence of thoracic spine fracture. Alignment is normal. No other significant bone abnormalities are identified. IMPRESSION: Negative. Electronically Signed   By: Virgina [place] M.D.   On: 08/14/2020 20:04   DG Shoulder Right  Result Date: 08/14/2020 CLINICAL DATA:  Status post fall. EXAM: RIGHT SHOULDER - 2+ VIEW COMPARISON:  None. FINDINGS: There is no evidence of fracture or dislocation. There is no evidence of arthropathy or other focal bone abnormality. Soft tissues are unremarkable. IMPRESSION: Negative. Electronically Signed   By: Virgina [place] M.D.   On: 08/14/2020 20:02   CT Head Wo Contrast  Result Date: 08/14/2020 CLINICAL DATA:  Status post fall. EXAM: CT HEAD WITHOUT CONTRAST TECHNIQUE: Contiguous axial images were obtained from the base of the skull through the vertex without intravenous contrast. COMPARISON:  March 27, 2019 FINDINGS: Brain: There is mild cerebral atrophy with widening of the extra-axial spaces and ventricular dilatation. There are areas of decreased attenuation within the white matter tracts of the supratentorial brain, consistent with microvascular disease changes. Vascular: No hyperdense vessel or unexpected calcification. Skull:  Normal. Negative for fracture or focal lesion. Sinuses/Orbits: No acute finding. Other: None. IMPRESSION: 1. Mild cerebral atrophy. 2. No acute intracranial abnormality. Electronically Signed   By: Virgina [place] M.D.   On: 08/14/2020 20:08   CT Cervical Spine Wo Contrast  Result Date: 08/14/2020 CLINICAL DATA:  Status post fall. EXAM: CT CERVICAL SPINE WITHOUT CONTRAST TECHNIQUE: Multidetector CT imaging of the cervical spine was performed without intravenous contrast. Multiplanar CT image reconstructions were also generated. COMPARISON:  None. FINDINGS: Alignment: Normal. Skull base and vertebrae: No acute fracture. No primary bone lesion or focal pathologic process. Soft tissues and spinal canal: No prevertebral fluid or swelling. No visible canal hematoma. Disc levels: Normal multilevel endplates are seen with normal multilevel intervertebral disc spaces. Normal bilateral multilevel joints are noted. Upper chest: Negative. Other: None. IMPRESSION: No acute fracture or subluxation of the cervical spine. Electronically Signed   By: Virgina [place] M.D.   On: 08/14/2020 20:15   MR BRAIN WO CONTRAST  Result  Date: 08/18/2020 CLINICAL DATA:  Dizziness EXAM: MRI HEAD WITHOUT CONTRAST TECHNIQUE: Multiplanar, multiecho pulse sequences of the brain and surrounding structures were obtained without intravenous contrast. COMPARISON:  None. FINDINGS: Brain: No acute infarct, acute hemorrhage or extra-axial collection. Normal white matter signal. Normal volume of CSF spaces. Fewer than 5 scattered microhemorrhages in a nonspecific pattern. Normal midline structures. Vascular: Major flow voids are preserved. Skull and upper cervical spine: Normal calvarium and skull base. Visualized upper cervical spine and soft tissues are normal. Sinuses/Orbits:No paranasal sinus fluid levels or advanced mucosal thickening. No mastoid or middle ear effusion. Normal orbits. IMPRESSION: 1. No acute intracranial abnormality. 2.  Fewer than 5 scattered microhemorrhages in a nonspecific pattern. Electronically Signed   By: Ulyses Jarred M.D.   On: 08/18/2020 01:22   DG FL GUIDED LUMBAR PUNCTURE  Result Date: 08/19/2020 CLINICAL DATA:  Cephalopathy. EXAM: DIAGNOSTIC LUMBAR PUNCTURE UNDER FLUOROSCOPIC GUIDANCE FLUOROSCOPY TIME:  Radiation Exposure Index (if provided by the fluoroscopic device): 20.7 mGy. Number of Acquired Spot Images: 1. PROCEDURE: Informed consent was obtained from the patient prior to the procedure, including potential complications of headache, allergy, and pain. With the patient prone, the lower back was prepped with Betadine. 1% Lidocaine was used for local anesthesia. Lumbar puncture was performed at the L3-4 level using a 20 gauge needle with return of clear CSF with an opening pressure of 15 cm water. 12 ml of CSF were obtained for laboratory studies. The patient tolerated the procedure well and there were no apparent complications. IMPRESSION: Under fluoroscopic guidance, successful lumbar puncture at L3-4 level for diagnostic purposes. Electronically Signed   By: Marijo Conception M.D.   On: 08/19/2020 17:03    Microbiology: Recent Results (from the past 240 hour(s))  Respiratory Panel by RT PCR (Flu A&B, Covid) - Nasopharyngeal Swab     Status: None   Collection Time: 08/18/20  9:52 AM   Specimen: Nasopharyngeal Swab; Nasopharyngeal(NP) swabs in vial transport medium  Result Value Ref Range Status   SARS Coronavirus 2 by RT PCR NEGATIVE NEGATIVE Final    Comment: (NOTE) SARS-CoV-2 target nucleic acids are NOT DETECTED.  The SARS-CoV-2 RNA is generally detectable in upper respiratoy specimens during the acute phase of infection. The lowest concentration of SARS-CoV-2 viral copies this assay can detect is 131 copies/mL. A negative result does not preclude SARS-Cov-2 infection and should not be used as the sole basis for treatment or other patient management decisions. A negative result may occur  with  improper specimen collection/handling, submission of specimen other than nasopharyngeal swab, presence of viral mutation(s) within the areas targeted by this assay, and inadequate number of viral copies (<131 copies/mL). A negative result must be combined with clinical observations, patient history, and epidemiological information. The expected result is Negative.  Fact Sheet for Patients:  PinkCheek.be  Fact Sheet for Healthcare Providers:  GravelBags.it  This test is no t yet approved or cleared by the [place] FDA and  has been authorized for detection and/or diagnosis of SARS-CoV-2 by FDA under an Emergency Use Authorization (EUA). This EUA will remain  in effect (meaning this test can be used) for the duration of the COVID-19 declaration under Section 564(b)(1) of the Act, 21 U.S.C. section 360bbb-3(b)(1), unless the authorization is terminated or revoked sooner.     Influenza A by PCR NEGATIVE NEGATIVE Final   Influenza B by PCR NEGATIVE NEGATIVE Final    Comment: (NOTE) The Xpert Xpress SARS-CoV-2/FLU/RSV assay is intended as an aid in  the diagnosis of influenza from Nasopharyngeal swab specimens and  should not be used as a sole basis for treatment. Nasal washings and  aspirates are unacceptable for Xpert Xpress SARS-CoV-2/FLU/RSV  testing.  Fact Sheet for Patients: PinkCheek.be  Fact Sheet for Healthcare Providers: GravelBags.it  This test is not yet approved or cleared by the [place] FDA and  has been authorized for detection and/or diagnosis of SARS-CoV-2 by  FDA under an Emergency Use Authorization (EUA). This EUA will remain  in effect (meaning this test can be used) for the duration of the  Covid-19 declaration under Section 564(b)(1) of the Act, 21  U.S.C. section 360bbb-3(b)(1), unless the authorization is  terminated or  revoked. Performed at [facility], [place] [address] [place], [place] [address]   CSF culture     Status: None   Collection Time: 08/19/20  4:53 PM   Specimen: PATH Cytology CSF; Cerebrospinal Fluid  Result Value Ref Range Status   Specimen Description CSF  Final   Special Requests NONE  Final   Gram Stain   Final    WBC PRESENT, PREDOMINANTLY MONONUCLEAR NO ORGANISMS SEEN CYTOSPIN SMEAR    Culture   Final    NO GROWTH 3 DAYS Performed at [facility], [place] [address], [place], [place] [address]    Report Status 08/23/2020 FINAL  Final     Labs: Basic Metabolic Panel: Recent Labs  Lab 08/18/20 0810 08/20/20 0437 08/21/20 0307 08/22/20 0335 08/23/20 0253  NA 137 139 138 139 141  K 3.5 3.8 3.6 4.1 4.0  CL 105 111 109 109 110  CO2 21* 22 20* 21* 21*  GLUCOSE 93 91 102* 84 82  BUN 13 6 6 6 6   CREATININE 1.21* 0.73 0.88 0.94 0.85  CALCIUM 8.6* 8.1* 7.9* 8.0* 8.5*   Liver Function Tests: Recent Labs  Lab 08/17/20 1605 08/20/20 0437 08/21/20 0307 08/23/20 0253  AST 92* 24 20 18   ALT 47* 21 18 17   ALKPHOS 114 70 75 65  BILITOT 1.3* 0.6 0.8 0.6  PROT 5.8* 4.9* 4.6* 4.9*  ALBUMIN 3.0* 2.3* 2.1* 2.3*   No results for input(s): LIPASE, AMYLASE in the last 168 hours. Recent Labs  Lab 08/18/20 0131  AMMONIA 49*   CBC: Recent Labs  Lab 08/17/20 1605 08/18/20 0004 08/20/20 1604 08/21/20 0307 08/22/20 0335 08/23/20 0253 08/24/20 0502  WBC 5.4   < > 6.3 5.9 6.4 6.3 5.8  NEUTROABS 3.7  --   --   --   --   --   --   HGB 9.4*   < > 8.7* 8.3* 7.7* 8.4* 8.1*  HCT 27.0*   < > 24.6* 23.1* 22.3* 24.6* 23.3*  MCV 126.8*   < > 111.3* 113.2* 113.8* 114.4* 115.3*  PLT 368   < > 295 310 308 338 347   < > = values in this interval not displayed.   Cardiac Enzymes: Recent Labs  Lab 08/18/20 1635  CKTOTAL 39   BNP: BNP (last 3 results) Recent Labs    12/29/19 1136 08/18/20 2152  BNP 44.5 95.8    ProBNP (last 3 results) No results for input(s):  PROBNP in the last 8760 hours.  CBG: No results for input(s): GLUCAP in the last 168 hours.     Signed:  Domenic Polite MD.  [facility] 08/24/2020, 1:31 PM

## 2020-08-24 NOTE — Progress Notes (Addendum)
[place] for Heparin and resume Warfarin >> Lovenox + Warfarin (see addendum) Indication: Mechanical MVR  Patient Measurements: Height: 5\' 2"  (157.5 cm) Weight: 51.3 kg (113 lb 1.5 oz) IBW/kg (Calculated) : 50.1  Vital Signs: Temp: 97.7 F (36.5 C) (11/29 0643) Temp Source: Oral (11/29 0643) BP: 127/95 (11/29 0643) Pulse Rate: 92 (11/29 0643)  Labs: Recent Labs    08/22/20 0335 08/22/20 0335 08/23/20 0253 08/24/20 0502  HGB 7.7*   < > 8.4* 8.1*  HCT 22.3*  --  24.6* 23.3*  PLT 308  --  338 347  LABPROT 14.6  --  15.2 16.2*  INR 1.2  --  1.2 1.4*  HEPARINUNFRC 0.34  --  0.34 0.52  CREATININE 0.94  --  0.85  --    < > = values in this interval not displayed.    Estimated Creatinine Clearance: 57.8 mL/min (by C-G formula based on SCr of 0.85 mg/dL).    Assessment: 25 YOF on Coumadin PTA for history of mechantical MVR. Initially thought to have a GIB, but GI has evaluated patient and states no GI issues currently and they have signed off.  Currently on IV heparin bridge.  Pharmacy consulted 11/26 to resume Warfarin.  Goal INR 2.5-3.5 per coumadin clinic notes.    PTA warfarin dose:  2.5 mg every Sunday and Wed,  5mg  all other days of the week.  Heparin level this morning is acceptable of lower goal range (HL 0.52, goal of 0.3-0.5) given daily fluctuations and no longer concerned for active GIB. INR today remains SUBtherapeutic but trending up (INR 1.4 << 1.2, goal of 2.5-3.5). Dosing cautiously given elevated INR on admission - will increase the dose today.   Goal of Therapy:  Goal INR 2.5-3.5 Heparin level 0.3-0.5 units/ml Monitor platelets by anticoagulation protocol: Yes   Plan:  - Continue Heparin at 900 units/hr - Warfarin 7.5 mg x 1 dose at 1600 today - Will continue to monitor for any signs/symptoms of bleeding and will follow up with heparin level and PT/INR in the a.m.    Thank you for allowing pharmacy to be a part  of this patient's care.  Alycia Rossetti, PharmD, BCPS Clinical Pharmacist Clinical phone for 08/24/2020: [phone] 08/24/2020 8:37 AM   **Pharmacist phone directory can now be found on [email or web address] ([email or web address]).  Listed under [place].  Addendum: The plan is now to transition the patient to Lovenox for plans to discharge. Given the patient's weight and renal function her dose would be 50 mg SQ bid.   Plan - Lovenox 50 mg SQ bid  - Upon discharge - would recommend Warfarin 7.5 mg x 1 today, then to resume the PTA dosing until seen by the anticoagulation clinic - preferably by Wed, 12/1 or Thurs, 12/2.   Alycia Rossetti, PharmD, BCPS Pager: [phone] 1:20 PM

## 2020-08-24 NOTE — Progress Notes (Signed)
DISCHARGE NOTE [place] to be discharged Home with [facility] per MD order. Discussed prescriptions and follow up appointments with the patient and daughter. Prescriptions given; medication list explained in detail. Patient and daughter verbalized understanding.  Skin clean, dry and intact without evidence of skin break down, no evidence of skin tears noted. IV catheter discontinued intact. Site without signs and symptoms of complications. Dressing and pressure applied. Pt denies pain at the site currently. No complaints noted.  Patient free of lines, drains, and wounds.   An After Visit Summary (AVS) was printed and given to the patient. Pt has walker and 3-in-1 BSC upon discharge. Patient escorted via wheelchair, and discharged home via private auto.  Orville Govern, RN

## 2020-08-24 NOTE — Progress Notes (Signed)
   08/24/20 0119  Vitals  Temp 98.3 F (36.8 C)  Temp Source Oral  BP 137/82  BP Location Left Arm  BP Method Automatic  Patient Position (if appropriate) Lying  Pulse Rate 87  Pulse Rate Source Monitor  Resp 15  MEWS COLOR  MEWS Score Color Green  Oxygen Therapy  SpO2 98 %  O2 Device Room Air  ECG Monitoring  CV Strip Heart Rate 166  Cardiac Rhythm SVT;Other (Comment) (18 BTs SVT/HR 166 Non-sustained-RN Sammie Notified/Aware)  MEWS Score  MEWS Temp 0  MEWS Systolic 0  MEWS Pulse 0  MEWS RR 0  MEWS LOC 0  MEWS Score 0  Provider Notification  Provider Name/Title [place], MD  Date Provider Notified 08/24/20  Time Provider Notified [phone]  Notification Type Page  Notification Reason Other (Comment) (18 beats of SVT w/ Heart rate 166 per telemonitoring )  Response No new orders (Dr. Tonie Griffith advised to monitor)  Date of Provider Response 08/24/20  Time of Provider Response 0150

## 2020-08-24 NOTE — Progress Notes (Signed)
Patient said she had given her self lovenox injection in the past. No education needed as per patient. DC instructions were given to patient and daughter.

## 2020-08-24 NOTE — TOC Transition Note (Signed)
Transition of Care [facility]) - CM/SW Discharge Note   Patient Details  Name: Mary [name] MRN: [id number] Date of Birth: [birth year]  [facility] CM/SW Contact:  Bartholomew Crews, RN Phone Number: [phone] 08/24/2020, 4:09 PM   Clinical Narrative:     Notified by Amy at [facility]. [facility] - initial plan for 2 RN visits, 3 PT visits, and 1 SW visit. Notified daughter that [facility] was accepted. Notified coumadin clinic of lab work to be done on Thursday. No further TOC needs at this time.   Final next level of care: [place] Barriers to Discharge: No Barriers Identified   Patient Goals and CMS Choice Patient states their goals for this hospitalization and ongoing recovery are:: home with home health CMS Medicare.gov Compare Post Acute Care list provided to:: Patient Choice offered to / list presented to : Patient, Adult Children  Discharge Placement                       Discharge Plan and Services In-house Referral: Clinical Social Work, Conservation officer, nature Services: CM Consult Post Acute Care Choice: Museum/gallery conservator, Home Health          DME Arranged: 3-N-1, [place] Rile DME Agency: [employer] Date DME Agency Contacted: 08/24/20 Time DME Agency Contacted: [phone] Representative spoke with at DME Agency: Freda Munro HH Arranged: RN, PT, Social Work [employer] Agency: [facility] Date [place]: 08/24/20 Time [place]: [place] Representative spoke with at [place]: Amy  Social Determinants of Health ([place]) Interventions     Readmission Risk Interventions No flowsheet data found.

## 2020-08-24 NOTE — TOC Transition Note (Signed)
Transition of Care [facility]) - CM/SW Discharge Note   Patient Details  Name: GLORISTINE TURRUBIATES MRN: [id number] Date of Birth: [birth year]  Transition [facility] Care [facility]) CM/SW Contact:  Bartholomew Crews, RN Phone Number: [phone] 08/24/2020, 12:52 PM   Clinical Narrative:      Final next level of care: [place] Barriers to Discharge: No Barriers Identified   Patient Goals and CMS Choice Patient states their goals for this hospitalization and ongoing recovery are:: home with home health CMS Medicare.gov Compare Post Acute Care list provided to:: Patient Choice offered to / list presented to : Patient, Adult Children  Discharge Placement                       Discharge Plan and Services In-house Referral: Clinical Social Work, Conservation officer, nature Services: CM Consult Post Acute Care Choice: Museum/gallery conservator, Home Health          DME Arranged: 3-N-1, Gilford Rile DME Agency: [facility] Date DME Agency Contacted: 08/24/20 Time DME Agency Contacted: [phone] Representative spoke with at DME Agency: Freda Munro HH Arranged: RN, PT, Social Work [employer] Agency: [facility] [place] Date [place]: 08/24/20 Time [place]: [id number] Representative spoke with at [place]: Amy  Social Determinants of Health ([place]) Interventions     Readmission Risk Interventions No flowsheet data found.

## 2020-08-24 NOTE — TOC Progression Note (Signed)
Transition of Care [facility]) - Progression Note    Patient Details  Name: Mary [name] MRN: [id number] Date of Birth: May 26, 1961  Transition of Care [facility]) CM/SW Contact  Bartholomew Crews, RN Phone Number: [phone] 08/24/2020, 12:59 PM  Clinical Narrative:      Spoke with patient and daughter several times this morning to work on transition planning.   DME RW and 3N1 ordered and referral sent to  for charity request - DME to be delivered to bedside.   [facility] notified of charity need for [facility] RN for INR to be drawn on Thursday, and PT and SW to follow up. Pending approval. Patient and daughter made aware of home health agency.   TOC notified of need for lovenox and has already used [facility]. Cash price is $25. Patient and daughter notified.   TOC following for transition needs.   Expected Discharge Plan: [place] Barriers to Discharge: No Barriers Identified  Expected Discharge Plan and Services Expected Discharge Plan: [place] In-house Referral: Clinical Social Work, Development worker, community Discharge Planning Services: CM Consult Post Acute Care Choice: Durable Medical Equipment, Home Health Living arrangements for the past 2 months: Apartment Expected Discharge Date: 08/24/20               DME Arranged: Berta Minor DME Agency: [facility] Date DME Agency Contacted: 08/24/20 Time DME Agency Contacted: [id number] Representative spoke with at DME Agency: Freda Munro HH Arranged: RN, PT, Social Work [facility] Agency: [facility] Date [place]: 08/24/20 Time [place]: [phone] Representative spoke with at [place]: Amy   Social Determinants of Health ([place]) Interventions    Readmission Risk Interventions No flowsheet data found.

## 2020-08-25 ENCOUNTER — Telehealth: Payer: Self-pay

## 2020-08-25 LAB — PATHOLOGIST SMEAR REVIEW

## 2020-08-25 LAB — OLIGOCLONAL BANDS, CSF + SERM

## 2020-08-25 MED FILL — GABAPENTIN 100 MG CAPSULE: 100 | 30 days supply | Qty: 60 | Fill #1

## 2020-08-25 NOTE — Telephone Encounter (Signed)
Transition Care Management Unsuccessful Follow-up Telephone Call  Date of discharge and from where:  08/24/2020, [facility]   Attempts:  1st Attempt  Reason for unsuccessful TCM follow-up call:  Left voice message. Call placed to # [phone]  Patient has hospital follow up appointment scheduled at [facility] - 09/22/2020.

## 2020-08-26 ENCOUNTER — Telehealth: Payer: Self-pay

## 2020-08-26 ENCOUNTER — Ambulatory Visit (INDEPENDENT_AMBULATORY_CARE_PROVIDER_SITE_OTHER): Payer: Self-pay | Admitting: Pharmacist

## 2020-08-26 ENCOUNTER — Telehealth: Payer: Self-pay | Admitting: *Deleted

## 2020-08-26 DIAGNOSIS — Z7901 Long term (current) use of anticoagulants: Secondary | ICD-10-CM

## 2020-08-26 LAB — POCT INR: INR: 2.3 (ref 2.0–3.0)

## 2020-08-26 LAB — METHYLMALONIC ACID, SERUM: Methylmalonic Acid, Quantitative: 415 nmol/L — ABNORMAL HIGH (ref 0–378)

## 2020-08-26 NOTE — Patient Instructions (Signed)
Description   Spoke with Jackelyn Poling, RN with [facility]. Advised pt to continue Lovenox 50mg  BID today and tomorrow, then stop. Take warfarin 5mg  today, then continue taking 5mg  daily except 2.5mg  on Sundays and Wednesdays. Recheck INR next Tuesday. [facility] # [phone] , Eather Colas  Please Bill Code [phone]

## 2020-08-26 NOTE — Telephone Encounter (Signed)
Transition Care Management Follow-up Telephone Call  Date of discharge and from where: 08/24/2020, [facility]   How have you been since you were released from the hospital? 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 She is not able to chew but she can swallow.  She was seen by the speech therapist in the hospital and her swallow was evaluated with recommendation for puree foods. She would like to see a provider prior to her scheduled appointment 09/22/2020.  She was scheduled for a hospital follow up appointment tomorrow - 08/27/2020   She can then re-estab care with a new PCP at [facility].   Any questions or concerns? Yes   Debbie, RN /[facility] was with the patient during the call. She explained that she did an INR today with result of 2.3 called to the coumadin clinic.  Jackelyn Poling stated that the patient was instructed to continue with lovenox today and tomorrow and then stop taking it.  She was also advised to take coumadin 5 mg daily except Sun and Wed when she is to take 2.5 mg.  She was also told that she does not need to go to the coumadin clinic tomorrow.  Debbie reported that the patient can't read her medication list/med bottles/discharge  Instructions. Patient is not able to break the coumadin tablet in half.  When asked about medication management to insure safe administration of the correct medications, Jackelyn Poling said that she could set up a med box for the patient and they would instruct her neighbor how to fill the patient's med box. The home health nurse visits will be stopping next week as this is charity care, patient's insurance does not cover home health.   The patient also reports swelling of her feet. Debbie, RN reports 1+ pitting edema of the feet.  The patient said that she had been taking lasix and potassium for this in the past but it was discontinued when she left the hospital.  She  wants to know if she should continue to take it.  Her weight this afternoon was 122 lbsJackelyn Poling ,Therapist, sports ) read the scale.  Patient reports her weight is usually around 110 lbs.   She has been denied disability three times and has re-applied with an interview scheduled for 09/14/2020.  She also has family planning medicaid and needs to re-apply for regular medicaid.  She will discuss further when she is in the clinic at her appointment. She may benefit from a referral to [employer] [facility] [place].   Items Reviewed:  Did the pt receive and understand the discharge instructions provided? she has them but is not able to read them.  her home health nurse reviewd them for her  Medications obtained and verified? she has all medications except the gabapentin 300mg ,  she needs a new prescription. She only has the 100 mg tablets.  She is concerned about taking three 100 mg tablets in lieu of the 300 mg tablet.  Debbie, RN has reviewd the medication orders with the patient  Other? No   Any new allergies since your discharge? No   Do you have support at home? No , she lives alone, her daughter just returned to [facility] and Equipment/Supplies: Were home health services ordered?yes If so, what is the name of the agency? [facility] Has the agency set up a time to come to the patient's home?  The RN was with the patient at the time of this call.  Were any new equipment or medical supplies ordered?  Yes: yes - RW and 3:1 commode What is the name of the medical supply agency? [facility] Were you able to get the supplies/equipment? Yes Do you have any questions related to the use of the equipment or supplies? No, she said she prefers to use the cane that she has.  The RW does not fit in her bathroom.    Functional Questionnaire: (I = Independent and D = Dependent) ADLs: independent. She said she could use some assistance with bathing.  Needs to have assistance with managing her medication.  Also needs assistive device  for ambulation.   Follow up appointments reviewed:   PCP Hospital f/u appt confirmed? appointment at [facility] tomorrow - 08/27/2020.  she will then need to establish care with a new PCP.    [facility] f/u appt confirmed? Yes , cardiology -  08/28/2020   Are transportation arrangements needed? she uses [employer] and will arrange a ride to [facility] for the appt tomorrow.   If their condition worsens, is the pt aware to call PCP or go to the Emergency Dept.? yes  Was the patient provided with contact information for the PCP's office or ED? She has the phone number for the clinic  Was to pt encouraged to call back with questions or concerns? yes

## 2020-08-26 NOTE — Telephone Encounter (Signed)
Transition Care Management Unsuccessful Follow-up Telephone Call  Date of discharge and from where:  08/24/2020, [name]  Attempts:  2nd Attempt  Reason for unsuccessful TCM follow-up call:  Left voice message - call placed to # [phone].  Patient has appointment at [facility] 09/22/2020. She can schedule an appointment to be seen sooner if she wishes.

## 2020-08-26 NOTE — Telephone Encounter (Addendum)
Pt is scheduled to have INR checked on 12/2. Per medlist at hospital discharge pt was given 40 mg syringe of Lovenox with the directions to inject 50 mg BID. Called and spoke to [place] and spoke to [place] who confirmed pt received 60 mg Lovenox injections with the instructions to inject 50mg  BID so pt is able to take the correct dosage of Lovenox.

## 2020-08-27 ENCOUNTER — Ambulatory Visit: Payer: Medicaid Other | Attending: Physician Assistant | Admitting: Physician Assistant

## 2020-08-27 ENCOUNTER — Other Ambulatory Visit: Payer: Self-pay

## 2020-08-27 ENCOUNTER — Encounter: Payer: Self-pay | Admitting: Physician Assistant

## 2020-08-27 ENCOUNTER — Other Ambulatory Visit: Payer: Self-pay | Admitting: Physician Assistant

## 2020-08-27 VITALS — BP 150/91 | HR 78 | Temp 98.7°F | Resp 18 | Ht 62.0 in | Wt 124.0 lb

## 2020-08-27 DIAGNOSIS — G629 Polyneuropathy, unspecified: Secondary | ICD-10-CM

## 2020-08-27 DIAGNOSIS — R200 Anesthesia of skin: Secondary | ICD-10-CM

## 2020-08-27 DIAGNOSIS — R202 Paresthesia of skin: Secondary | ICD-10-CM

## 2020-08-27 LAB — VITAMIN B6: Vitamin B6: 3.5 ug/L (ref 2.0–32.8)

## 2020-08-27 MED ORDER — FOLIC ACID 1 MG PO TABS: 1.0000 mg | ORAL_TABLET | Freq: Every day | ORAL | 2 refills | Status: DC

## 2020-08-27 MED ORDER — GABAPENTIN 100 MG PO CAPS: 100.0000 mg | ORAL_CAPSULE | Freq: Two times a day (BID) | ORAL | 3 refills | Status: DC

## 2020-08-27 MED ORDER — GABAPENTIN 300 MG PO CAPS: 300.0000 mg | ORAL_CAPSULE | Freq: Every day | ORAL | 0 refills | Status: DC

## 2020-08-27 MED ORDER — PANTOPRAZOLE SODIUM 40 MG PO TBEC: 40.0000 mg | DELAYED_RELEASE_TABLET | Freq: Every day | ORAL | 0 refills | Status: DC

## 2020-08-27 MED FILL — PANTOPRAZOLE SOD DR 40 MG T: 40 | 30 days supply | Qty: 30 | Fill #0

## 2020-08-27 MED FILL — FOLIC ACID 1 MG TABS: 1 | 30 days supply | Qty: 30 | Fill #0

## 2020-08-27 MED FILL — GABAPENTIN 300 MG CAPSULE: 300 | 30 days supply | Qty: 30 | Fill #0

## 2020-08-27 NOTE — Progress Notes (Signed)
Cardiology Office Note    Date:  08/28/2020   ID:  Mary [name], DOB [birth year], MRN [id number]  PCP:  Antony Blackbird, MD  Cardiologist:  Candee Furbish, MD  Electrophysiologist:  Cristopher Peru, MD   Chief Complaint: f/u cardiac disease amongst numerous medical problems  History of Present Illness:   Mary [name] is a 59 y.o. 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  She was previously followed by Dr. Aundra Dubin then Dr. Saunders Revel after Dr. Aundra Dubin transitioned to the AHF clinic. She has more recently been followed by Dr. Lovena Le with EP and Dr. Marlou Porch. (She had one acute visit with Dr. Irish Lack.) She history of mitral valve disease and tricuspid valve disease in 2014 as above. She was admitted around that time with fever, cough, new murmur, and was found to have severe MR and mild-moderate TR. There was initial suggestion of vegetation on anterior mitral leaflet imaging but blood cultures remained negative although obtained after antibiotics were started. She was treated with 4 weeks of antibiotics. TEE showed severe mitral regurgitation with findings more consistent with rheumatic mitral valve disease. There was no clear vegetation identified. Pre-op cath showed no significant CAD. Post-op course was notable for brief transient afib in the OR, and post-op complete heart block after which conduction recovered without need for PPM at that time. In 03/2018, she was felt to be symptomatic and had intermittent CHB during the day.  She was seen by EP and PPM was recommended. However, she never called back to schedule this.She was then admitted 03/2019 withsyncope.She was found to have anemia with Hgb downtrending from 12-13 to 7.8, felt due to B12 deficiency (FOBT negative at that time). She was seen byDr. Rich Brave evaluation.He favored ILRimplantation but her insurance would not cover this.Therefore, she has not undergone ILR implantation. Her case was sent to the social worker in our system. It appeared the patient was sent the assistance fund application several times but did not complete it as requested. She also has had lapses in compliance with Coumadin follow-up which SW helped arrange transportation. She has also had sinus tachycardia which was managed conservatively given history of heart block. At [facility] 11/2019 with Dr .Lovena Le she noted severe fatigue and her labs showed new AKI, severe hyponatremia and anemia prompting hospital medical admission. Dr. Lovena Le did not think her symptoms were related to heart rhythm so he recommended holding off PPM. Her symptoms were ultimately felt due to gastroenteritis causing AKI. Hemolysis workup for anemia was unremarkable. She had dark stools in 12/2019 and was advised to to the ED but declined. She was seen by GI but cancelled her endoscopies as an outpatient. She wound up admitted in 04/2020 due to anemia felt secondary to acute gastritis. She required blood transfusion and underwent EGD and colonoscopy on 05/01/2020 by Dr. Ardis Hughs with findings of pyloric stenosis with biopsy taken and scattered areas of mild barotrauma otherwise no findings of significance within the colon. Pyloric stenosis was felt in part due to prior NSAID and ASA use.  Her most recent admission end of November 2021 was for multiple  complaints namely numbness, weakness, & dizziness. She was felt to have a progressive neuropathy/myopathy felt potentially related to alcoholism +/- contribution from gabapentin. Anemia and  AKI again noted during that admission. Per DC summary, "CT head was unremarkable, MRI noted few small microhemorrhages which was not felt to be significant, spinal tap also completed in radiology on 11/24 CSF preliminary studies are all unremarkable. Rheumatoid factor, ACE levels and heavy metal screen are all normal. Vitamin B12 was normal, Vitamin B1, B6, methylmalonic acid level are pending, SPEP is normal." Outpatient f/u with neurology was recommended.  She is seen back for follow-up today in good spirits and feels well from a cardiac standpoint.  She continues to have generalized weakness and neuropathy issues.  She denies any chest pain, shortness of breath.  She has noticed a return of mild lower extremity edema.  She was on Lasix 40 mg daily prior to last admission but this was stopped in the setting of AKI.  Her kidney function had returned to normal prior to discharge.   Labwork independently reviewed: 07/2020 Hgb 8.1, K 4.0, Cr 0.85, albumin 2.3   Past Medical History:  Diagnosis Date  . Abnormal liver function   . Acute diastolic heart failure ([place]) 03/15/2013  . Alcoholism ([place])   . Anemia   . B12 deficiency   . Carotid stenosis    Carotid US 2/17: bilateral ICA 1-39% >> FU prn  . Childhood asthma   . Chronic diastolic CHF (congestive heart failure) ([place])   . Complete heart block ([place])   . Epigastric hernia 200's  . GERD (gastroesophageal reflux disease)   . Heart murmur   . History of blood transfusion    "14 w/1st pregnancy; 2 w/last C-section" (03/15/2013)  . Hx of echocardiogram    a. Echo 4/16:  Mild focal basal septal hypertrophy, EF 60-65%, no RWMA, Mechanical MVR ok with mild central regurgitation and no perivalvular leak, small mobile density attached to valvular ring (1.5x1 cm) - post surgical changes vs SBE, mild LAE;   b. Echo 2/17:  Mild post wall LVH, EF 55-60%, no RWMA, mechanical MVR ok (mean 5 mmHg), normal RVSF  . Hypertension    no pcp   will go to [facility]  .  Macrocytic anemia   . Protein calorie malnutrition ([place])   . 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 S/P mitral valve replacement with metallic valve 6/80/3212   61mm Sorin Carbomedics mechanical prosthesis via right mini thoracotomy approach  . 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 Severe mitral regurgitation 04/22/2013  . Sinus tachycardia   . SVT (supraventricular tachycardia) ([place]) 03/16/2013  . TIA (transient ischemic attack)   . Tricuspid regurgitation     Past Surgical History:  Procedure Laterality Date  . APPENDECTOMY  [id number]  . BIOPSY  05/01/2020   Procedure: BIOPSY;  Surgeon: Milus Banister, MD;  Location: [facility];  Service: Endoscopy;;  . CARDIAC CATHETERIZATION    . CARDIAC VALVE REPLACEMENT    . CESAREAN SECTION  1983  . CESAREAN SECTION WITH BILATERAL TUBAL LIGATION  1999  . COLONOSCOPY WITH PROPOFOL N/A 05/01/2020   Procedure: COLONOSCOPY WITH PROPOFOL;  Surgeon: Milus Banister, MD;  Location: [facility] ENDOSCOPY;  Service: Endoscopy;  Laterality: N/A;  . ESOPHAGOGASTRODUODENOSCOPY (EGD) WITH PROPOFOL N/A 05/01/2020   Procedure: ESOPHAGOGASTRODUODENOSCOPY (EGD) WITH PROPOFOL;  Surgeon: Ardis Hughs,  Melene Plan, MD;  Location: [facility] ENDOSCOPY;  Service: Endoscopy;  Laterality: N/A;  . FEMORAL HERNIA REPAIR Right 05/23/2013   Procedure: HERNIA REPAIR FEMORAL;  Surgeon: Rexene Alberts, MD;  Location: [facility] [place];  Service: Open Heart Surgery;  Laterality: Right;  . INTRAOPERATIVE TRANSESOPHAGEAL ECHOCARDIOGRAM N/A 05/23/2013   Procedure: INTRAOPERATIVE TRANSESOPHAGEAL ECHOCARDIOGRAM;  Surgeon: Rexene Alberts, MD;  Location: [facility];  Service: Open Heart Surgery;  Laterality: N/A;  . LAPAROSCOPIC CHOLECYSTECTOMY  2003  . LEFT AND RIGHT HEART CATHETERIZATION WITH CORONARY ANGIOGRAM N/A 03/22/2013    Procedure: LEFT AND RIGHT HEART CATHETERIZATION WITH CORONARY ANGIOGRAM;  Surgeon: Burnell Blanks, MD;  Location: [facility] CATH LAB;  Service: Cardiovascular;  Laterality: N/A;  . MINIMALLY INVASIVE TRICUSPID VALVE REPAIR Right 05/23/2013   Procedure: MINIMALLY INVASIVE TRICUSPID VALVE REPAIR;  Surgeon: Rexene Alberts, MD;  Location: [place];  Service: Open Heart Surgery;  Laterality: Right;  . MITRAL VALVE REPLACEMENT N/A 05/23/2013   Procedure: MITRAL VALVE (MV) REPLACEMENT;  Surgeon: Rexene Alberts, MD;  Location: [place];  Service: Open Heart Surgery;  Laterality: N/A;  . MULTIPLE EXTRACTIONS WITH ALVEOLOPLASTY N/A 04/04/2013   Procedure: Extraction of tooth #'s 1,8,9,13,14,15,23,24,25,26 with alveoloplasty and gross debridement of remaining teeth;  Surgeon: Lenn Cal, DDS;  Location: [place];  Service: Oral Surgery;  Laterality: N/A;  . TEE WITHOUT CARDIOVERSION N/A 03/18/2013   Procedure: TRANSESOPHAGEAL ECHOCARDIOGRAM (TEE);  Surgeon: Larey Dresser, MD;  Location: [place];  Service: Cardiovascular;  Laterality: N/A;  . TEE WITHOUT CARDIOVERSION N/A 06/17/2013   Procedure: TRANSESOPHAGEAL ECHOCARDIOGRAM (TEE);  Surgeon: Lelon Perla, MD;  Location: [facility] ENDOSCOPY;  Service: Cardiovascular;  Laterality: N/A;  . TUBAL LIGATION  1999    Current Medications: Current Meds  Medication Sig  . acetaminophen (TYLENOL) 500 MG tablet Take 1 tablet (500 mg total) by mouth every 6 (six) hours as needed for mild pain or headache.  . Cyanocobalamin (VITAMIN B-12) 500 MCG TABS Take 500 mcg by mouth daily.  . folic acid (FOLVITE) 1 MG tablet Take 1 tablet (1 mg total) by mouth daily.  [name] gabapentin (NEURONTIN) 100 MG capsule Take 1 capsule (100 mg total) by mouth 2 (two) times daily. And continue 300 mg at bedtime  . gabapentin (NEURONTIN) 300 MG capsule Take 1 capsule (300 mg total) by mouth at bedtime.  . Hyprom-Naphaz-Polysorb-Zn Sulf (CLEAR EYES COMPLETE OP) Place 2 drops into both eyes daily  as needed (red eyes).  . pantoprazole (PROTONIX) 40 MG tablet Take 1 tablet (40 mg total) by mouth daily.  [name] thiamine 100 MG tablet Take 1 tablet (100 mg total) by mouth daily.  . Vitamin D, Ergocalciferol, (DRISDOL) 1.25 MG (50000 UNIT) CAPS capsule Take 1 capsule (50,000 Units total) by mouth every 7 (seven) days.  [name] warfarin (COUMADIN) 5 MG tablet Take 7.5mg  daily for 3days then based on INR as directed by Coumadin clinic  . [DISCONTINUED] enoxaparin (LOVENOX) 40 MG/0.4ML injection Take 50mg  Subcutaneous twice daily for 3days and then based on INR check on Thurday 12/2, stop lovenox when INR>2.5     Allergies:   Oxycodone and Tape   Social History   Socioeconomic History  . Marital status: Single    Spouse name: Not on file  . Number of children: Not on file  . Years of education: Not on file  . Highest education level: Not on file  Occupational History  . Not on file  Tobacco Use  . Smoking status: Former Smoker  Packs/day: 0.50    Years: 36.00    Pack years: 18.00    Types: Cigarettes    Quit date: 03/15/2013    Years since quitting: 7.4  . Smokeless tobacco: Never Used  Vaping Use  . Vaping Use: Never used  Substance and Sexual Activity  . Alcohol use: Yes    Comment:  "glass of wine"  . Drug use: No  . Sexual activity: Never  Other Topics Concern  . Not on file  Social History Narrative  . Not on file   Social Determinants of Health   Financial Resource Strain:   . Difficulty of Paying Living Expenses: Not on file  Food Insecurity:   . Worried About Charity fundraiser in the Last Year: Not on file  . Ran Out of Food in the Last Year: Not on file  Transportation Needs:   . Lack of Transportation (Medical): Not on file  . Lack of Transportation (Non-Medical): Not on file  Physical Activity:   . Days of Exercise per Week: Not on file  . Minutes of Exercise per Session: Not on file  Stress:   . Feeling of Stress : Not on file  Social Connections:   .  Frequency of Communication with Friends and Family: Not on file  . Frequency of Social Gatherings with Friends and Family: Not on file  . Attends Religious Services: Not on file  . Active Member of Clubs or Organizations: Not on file  . Attends Archivist Meetings: Not on file  . Marital Status: Not on file     Family History:  The patient's family history includes Cancer in her father; Sarcoidosis in her sister; Stroke in her father. There is no history of Heart attack, Colon cancer, Stomach cancer, Pancreatic cancer, or Esophageal cancer.  ROS:   Please see the history of present illness.  All other systems are reviewed and otherwise negative.    EKGs/Labs/Other Studies Reviewed:    Studies reviewed are outlined and summarized above. Reports included below if pertinent.  2D Echo 12/2019 1. Left ventricular ejection fraction, by estimation, is 55 to 60%. The  left ventricle has normal function. The left ventricle has no regional  wall motion abnormalities. Left ventricular diastolic function could not  be evaluated.  2. Right ventricular systolic function is normal. The right ventricular  size is normal.  3. The mitral valve has been repaired/replaced. No evidence of mitral  valve regurgitation. There is a mechanical valve present in the mitral  position.  4. The tricuspid valve is has been repaired/replaced. The tricuspid valve  is status post repair with an annuloplasty ring.  5. The aortic valve is normal in structure. Aortic valve regurgitation is  trivial. No aortic stenosis is present.     EKG:  EKG is ordered today, personally reviewed, demonstrating sinus tach 114bpm, nonspecific STT changes similar to prior, one PVC. Low voltage QRS.  Recent Labs: 05/04/2020: Magnesium 1.0 06/26/2020: TSH 2.170 08/18/2020: B Natriuretic Peptide 95.8 08/23/2020: ALT 17; BUN 6; Creatinine, Ser 0.85; Potassium 4.0; Sodium 141 08/24/2020: Hemoglobin 8.1; Platelets 347    Recent Lipid Panel    Component Value Date/Time   CHOL 189 05/31/2018 1108   TRIG 300 (H) 05/31/2018 1108   HDL 68 05/31/2018 1108   CHOLHDL 2.8 05/31/2018 1108   CHOLHDL 3.2 06/06/2016 0135   VLDL 22 06/06/2016 0135   LDLCALC 61 05/31/2018 1108    PHYSICAL EXAM:    VS:  BP 132/62  Pulse (!) 114   Ht 5\' 2"  (1.575 m)   Wt 125 lb (56.7 kg)   LMP 03/22/2013   SpO2 96%   BMI 22.86 kg/m   BMI: Body mass index is 22.86 kg/m.  GEN: Thin AAF in no acute distress in wheelchair (presented with cane) HEENT: normocephalic, atraumatic Neck: no JVD, carotid bruits, or masses Cardiac: reg rhythm, borderline tachycardic, no murmurs, rubs, or gallops, 1+ shiny soft BLE edema  Respiratory:  clear to auscultation bilaterally, normal work of breathing GI: soft, nontender, nondistended, + BS MS: no deformity or atrophy Skin: warm and dry, no rash Neuro:  Alert and Oriented x 3, Strength and sensation are intact, follows commands, ambulates with cane Psych: cheerful, euthymic mood, full affect  Wt Readings from Last 3 Encounters:  08/28/20 125 lb (56.7 kg)  08/27/20 124 lb (56.2 kg)  08/18/20 113 lb 1.5 oz (51.3 kg)     ASSESSMENT & PLAN:   1. Sinus tachycardia, PSVT - current rhythm is borderline sinus tach.  She has history of this which is historically managed conservatively given her concomitant history of complete heart block.  No PSVT seen today.  Continue to avoid AV nodal blocking agents.  Will obtain a follow-up CBC, BMET and TSH today.  Her heart rate fluctuations are likely in part due to her medical comorbidities.  2. History of mitral valve replacement - most recently assessed by echo earlier this year.  INR is followed by our Coumadin clinic who coordinates her care with home health.  She has stopped Lovenox per their instructions and continues on with Coumadin.  SBE prophylaxis reviewed. The patient plans to try to establish back with dentistry as well. She is not on ASA  given h/o severe anemia and GI bleeding.  3. Chronic anemia -suspect multifactorial, should be further followed by primary care.  Since she is in the office today, we will go ahead and recheck her hemoglobin to make sure it is stable today.  She denies any evidence of GI bleeding at this time.   4. History of complete heart block - per EP's prior recommendations, conservative approach is recommend.  She is not having any high risk symptoms to suggest recurrence at this time.  5. Chronic diastolic CHF - mild hypervolemia noticed on exam in the form of lower extremity edema.  Her albumin was recently quite low as well which I suspect is contributing.  I do not think she needs the full Lasix dose at this time.  We will restart her previous Lasix rx that she has on hand (40mg ) at 1/2 tablet daily as needed only.  Recheck BMET as above to help guide whether she needs any potassium repletion with this.  She reports she is not on any currently.  Disposition: F/u with  Dr. Marlou Porch in 3 months. Also has interim f/u with primary care planned next week, and neuro referral pending.   Medication Adjustments/Labs and Tests Ordered: Current medicines are reviewed at length with the patient today.  Concerns regarding medicines are outlined above. Medication changes, Labs and Tests ordered today are summarized above and listed in the Patient Instructions accessible in Encounters.   Signed, Charlie Pitter, PA-C  08/28/2020 12:38 PM    [facility] [address], [place], [place]  [address] Phone: [phone]; Fax: [phone]

## 2020-08-27 NOTE — Telephone Encounter (Signed)
Met with the patient when she was in the clinic today.  She agreed to submit a referral to [employer] [place] to see if they can assist with her medicaid and disability applications as she has received 3 disability denials.   Referral sent to [facility]

## 2020-08-27 NOTE — Progress Notes (Signed)
Patient verified DOB Patient has eaten today but has yet to take her medication.  Patient reports neuropathy began in her feet and radiated to her legs. 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

## 2020-08-27 NOTE — Patient Instructions (Addendum)
Discontinue Lovenox at this time and continue Coumadin Follow a low sodium diet and keep feet elevated while at rest Ask neighbor for support in making sure you are taking correct medications on a daily basis Keep appt on 09/01/20 with Dr. Wynetta Emery Neurology appointment is pending  Kennieth Rad, PA-C Physician Assistant [place] [email or web address]    Low-Sodium Eating Plan Sodium, which is an element that makes up salt, helps you maintain a healthy balance of fluids in your body. Too much sodium can increase your blood pressure and cause fluid and waste to be held in your body. Your health care provider or dietitian may recommend following this plan if you have high blood pressure (hypertension), kidney disease, liver disease, or heart failure. Eating less sodium can help lower your blood pressure, reduce swelling, and protect your heart, liver, and kidneys. What are tips for following this plan? General guidelines  Most people on this plan should limit their sodium intake to 1,500-2,000 mg (milligrams) of sodium each day. Reading food labels   The Nutrition Facts label lists the amount of sodium in one serving of the food. If you eat more than one serving, you must multiply the listed amount of sodium by the number of servings.  Choose foods with less than 140 mg of sodium per serving.  Avoid foods with 300 mg of sodium or more per serving. Shopping  Look for lower-sodium products, often labeled as "low-sodium" or "no salt added."  Always check the sodium content even if foods are labeled as "unsalted" or "no salt added".  Buy fresh foods. ? Avoid canned foods and premade or frozen meals. ? Avoid canned, cured, or processed meats  Buy breads that have less than 80 mg of sodium per slice. Cooking  Eat more home-cooked food and less restaurant, buffet, and fast food.  Avoid adding salt when cooking. Use salt-free  seasonings or herbs instead of table salt or sea salt. Check with your health care provider or pharmacist before using salt substitutes.  Cook with plant-based oils, such as canola, sunflower, or olive oil. Meal planning  When eating at a restaurant, ask that your food be prepared with less salt or no salt, if possible.  Avoid foods that contain MSG (monosodium glutamate). MSG is sometimes added to [place] food, bouillon, and some canned foods. What foods are recommended? The items listed may not be a complete list. Talk with your dietitian about what dietary choices are best for you. Grains Low-sodium cereals, including oats, puffed wheat and rice, and shredded wheat. Low-sodium crackers. Unsalted rice. Unsalted pasta. Low-sodium bread. Whole-grain breads and whole-grain pasta. Vegetables Fresh or frozen vegetables. "No salt added" canned vegetables. "No salt added" tomato sauce and paste. Low-sodium or reduced-sodium tomato and vegetable juice. Fruits Fresh, frozen, or canned fruit. Fruit juice. Meats and other protein foods Fresh or frozen (no salt added) meat, poultry, seafood, and fish. Low-sodium canned tuna and salmon. Unsalted nuts. Dried peas, beans, and lentils without added salt. Unsalted canned beans. Eggs. Unsalted nut butters. Dairy Milk. Soy milk. Cheese that is naturally low in sodium, such as ricotta cheese, fresh mozzarella, or Swiss cheese Low-sodium or reduced-sodium cheese. Cream cheese. Yogurt. Fats and oils Unsalted butter. Unsalted margarine with no trans fat. Vegetable oils such as canola or olive oils. Seasonings and other foods Fresh and dried herbs and spices. Salt-free seasonings. Low-sodium mustard and ketchup. Sodium-free salad dressing. Sodium-free light mayonnaise. Fresh or refrigerated horseradish. Lemon juice. Vinegar. Homemade, reduced-sodium, or low-sodium  soups. Unsalted popcorn and pretzels. Low-salt or salt-free chips. What foods are not  recommended? The items listed may not be a complete list. Talk with your dietitian about what dietary choices are best for you. Grains Instant hot cereals. Bread stuffing, pancake, and biscuit mixes. Croutons. Seasoned rice or pasta mixes. Noodle soup cups. Boxed or frozen macaroni and cheese. Regular salted crackers. Self-rising flour. Vegetables Sauerkraut, pickled vegetables, and relishes. Olives. [place] fries. Onion rings. Regular canned vegetables (not low-sodium or reduced-sodium). Regular canned tomato sauce and paste (not low-sodium or reduced-sodium). Regular tomato and vegetable juice (not low-sodium or reduced-sodium). Frozen vegetables in sauces. Meats and other protein foods Meat or fish that is salted, canned, smoked, spiced, or pickled. Bacon, ham, sausage, hotdogs, corned beef, chipped beef, packaged lunch meats, salt pork, jerky, pickled herring, anchovies, regular canned tuna, sardines, salted nuts. Dairy Processed cheese and cheese spreads. Cheese curds. Blue cheese. Feta cheese. String cheese. Regular cottage cheese. Buttermilk. Canned milk. Fats and oils Salted butter. Regular margarine. Ghee. Bacon fat. Seasonings and other foods Onion salt, garlic salt, seasoned salt, table salt, and sea salt. Canned and packaged gravies. Worcestershire sauce. Tartar sauce. Barbecue sauce. Teriyaki sauce. Soy sauce, including reduced-sodium. Steak sauce. Fish sauce. Oyster sauce. Cocktail sauce. Horseradish that you find on the shelf. Regular ketchup and mustard. Meat flavorings and tenderizers. Bouillon cubes. Hot sauce and Tabasco sauce. Premade or packaged marinades. Premade or packaged taco seasonings. Relishes. Regular salad dressings. Salsa. Potato and tortilla chips. Corn chips and puffs. Salted popcorn and pretzels. Canned or dried soups. Pizza. Frozen entrees and pot pies. Summary  Eating less sodium can help lower your blood pressure, reduce swelling, and protect your heart, liver,  and kidneys.  Most people on this plan should limit their sodium intake to 1,500-2,000 mg (milligrams) of sodium each day.  Canned, boxed, and frozen foods are high in sodium. Restaurant foods, fast foods, and pizza are also very high in sodium. You also get sodium by adding salt to food.  Try to cook at home, eat more fresh fruits and vegetables, and eat less fast food, canned, processed, or prepared foods. This information is not intended to replace advice given to you by your health care provider. Make sure you discuss any questions you have with your health care provider. Document Revised: 08/25/2017 Document Reviewed: 09/05/2016 [employer] Patient Education  2020 [employer].

## 2020-08-27 NOTE — Progress Notes (Signed)
Established Patient Office Visit  Subjective:  Patient ID: Mary [name], female    DOB: 07-11-1961  Age: 59 y.o. MRN: [id number]  CC:  Chief Complaint  Patient presents with  . Neurologic Problem    HPI Mary [name] reports that she was hospitalized at [facility] from November 22 until November 29.  Hospital course:  History of present illness:  59 year old female with history of mechanical mitral valve and tricuspid repair in 2014 on chronic Coumadin, alcohol abuse, macrocytic anemia, chronic diastolic CHF, chronic erosive gastritis, pyloric stenosis, Called EMS due to multiple complaints namely numbness weakness dizziness etc. -Noted to have peripheral neuropathy and multiple other neurological symptoms  Hospital Course:   Progressive neuropathy/myopathy Gait disorder -Chronic symptoms progressively worsening prior to admission, chronic alcoholism suspected to be contributing, nutritional deficiencies also possible -Neurology consulted, CT head was unremarkable, MRI noted few small microhemorrhages which was not felt to be significant, spinal tap also completed in radiology on 11/24 CSF preliminary studies are all unremarkable -Rheumatoid factor, ACE levels and heavy metal screen are all normal  -Vitamin B12 was normal, Vitamin B1, B6, methylmalonic acid level are pending, SPEP is normal -Neurology suspected that most of her symptoms could be from alcoholism however recommended to follow-up on above studies and to follow-up with lumbar neurology for EMG/nerve conduction studies as outpatient -Treated with high-dose thiamine and vitamin B12 inpatient, continue thiamine and vitamin B12 at the time of discharge -Patient was also taking high doses of gabapentin which is contributing to dizziness, resumed at lower dose -Counseled regarding alcohol cessation -PT OT eval completed SNF recommended for rehabilitation, unfortunately uninsured and does not have benefits for  this -Referral sent to [place] neurology, charity home health physical therapy requested  History of mechanical mitral valve -Supratherapeutic INR, >10 on admission, reversed with vitamin K on admission, then remains subtherapeutic, started on heparin bridge, transition to Lovenox bridge with Coumadin at the time of discharge, INR 1.4, discharged on Lovenox 50 mg subcu twice daily for 3 to 4 days until INR greater than 2.5 -Next INR check on Thursday 12/2  Acute on chronic macrocytic anemia -Alcoholism likely contributing, B12 and folate within normal range now -Reticulocyte count is up, SPEP is normal, peripheral smear is pending at the time -EGD and colonoscopy in August noted pyloric stenosis with some narrowing, negative H. pylori, colonoscopy was negative -No overt bleeding reported, hemoglobin8.4 today,anemia panel with adequate iron stores -Coumadin continued, discharged on Lovenox bridge until INR therapeutic -PCP to monitor anemia  Chronic diastolic CHF -Clinically euvolemic throughout this admission, Lasix discontinued  Abnormal LFTs -Pattern consistent with alcoholic liver disease  Alcohol abuse -Counseled, not willing to accept this could be contributing to her symptoms, continue high-dose thiamine -No overt withdrawal, counseled  Severe protein calorie malnutrition -With severe hypoalbuminemia, temporal wasting, muscle atrophy, started supplements   Reports today that she does have some support through her neighbor helping her with meals, states that she was able to pure her own foods last night and felt that she ate well yesterday.  Patient is currently uninsured, did have a nurse come out yesterday through charity care to help fill her medications, will be able to come out 1 more time.  Did have INR checked yesterday and was within normal limits, Lovenox has been discontinued, she will continue Coumadin at this time  Patient complains of bilateral foot  swelling, has not been following low-sodium diet, does keep feet elevated at rest     Past  Medical History:  Diagnosis Date  . Abnormal liver function   . Acute diastolic heart failure ([place]) 03/15/2013  . Alcoholism ([place])   . Anemia   . B12 deficiency   . Carotid stenosis    Carotid US 2/17: bilateral ICA 1-39% >> FU prn  . Childhood asthma   . Chronic diastolic CHF (congestive heart failure) ([place])   . Complete heart block ([place])   . Epigastric hernia 200's  . GERD (gastroesophageal reflux disease)   . Heart murmur   . History of blood transfusion    "14 w/1st pregnancy; 2 w/last C-section" (03/15/2013)  . Hx of echocardiogram    a. Echo 4/16:  Mild focal basal septal hypertrophy, EF 60-65%, no RWMA, Mechanical MVR ok with mild central regurgitation and no perivalvular leak, small mobile density attached to valvular ring (1.5x1 cm) - post surgical changes vs SBE, mild LAE;   b. Echo 2/17:  Mild post wall LVH, EF 55-60%, no RWMA, mechanical MVR ok (mean 5 mmHg), normal RVSF  . Hypertension    no pcp   will go to [facility]  . Macrocytic anemia   . Protein calorie malnutrition ([place])   . 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 S/P mitral valve replacement with metallic valve 01/18/9562   92mm Sorin Carbomedics mechanical prosthesis via right mini thoracotomy approach  . 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   Complex valvuloplasty including Cor-matrix ECM patch augmentation of anterior and lateral leaflets with 17mm Edwards mc3 ring annuloplasty via right mini-thoracotomy approach  . Severe mitral regurgitation 04/22/2013  . Sinus tachycardia   . SVT (supraventricular tachycardia) ([place]) 03/16/2013  . TIA (transient ischemic attack)   . Tricuspid regurgitation     Past Surgical History:  Procedure Laterality Date  . APPENDECTOMY  [id number]  . BIOPSY  05/01/2020   Procedure: BIOPSY;  Surgeon: Milus Banister, MD;  Location: [place];  Service: Endoscopy;;  . CARDIAC  CATHETERIZATION    . CARDIAC VALVE REPLACEMENT    . CESAREAN SECTION  1983  . CESAREAN SECTION WITH BILATERAL TUBAL LIGATION  1999  . COLONOSCOPY WITH PROPOFOL N/A 05/01/2020   Procedure: COLONOSCOPY WITH PROPOFOL;  Surgeon: Milus Banister, MD;  Location: [facility] ENDOSCOPY;  Service: Endoscopy;  Laterality: N/A;  . ESOPHAGOGASTRODUODENOSCOPY (EGD) WITH PROPOFOL N/A 05/01/2020   Procedure: ESOPHAGOGASTRODUODENOSCOPY (EGD) WITH PROPOFOL;  Surgeon: Milus Banister, MD;  Location: [facility] ENDOSCOPY;  Service: Endoscopy;  Laterality: N/A;  . FEMORAL HERNIA REPAIR Right 05/23/2013   Procedure: HERNIA REPAIR FEMORAL;  Surgeon: Rexene Alberts, MD;  Location: [place];  Service: Open Heart Surgery;  Laterality: Right;  . INTRAOPERATIVE TRANSESOPHAGEAL ECHOCARDIOGRAM N/A 05/23/2013   Procedure: INTRAOPERATIVE TRANSESOPHAGEAL ECHOCARDIOGRAM;  Surgeon: Rexene Alberts, MD;  Location: [place];  Service: Open Heart Surgery;  Laterality: N/A;  . LAPAROSCOPIC CHOLECYSTECTOMY  2003  . LEFT AND RIGHT HEART CATHETERIZATION WITH CORONARY ANGIOGRAM N/A 03/22/2013   Procedure: LEFT AND RIGHT HEART CATHETERIZATION WITH CORONARY ANGIOGRAM;  Surgeon: Burnell Blanks, MD;  Location: [facility] C[facility] LAB;  Service: Cardiovascular;  Laterality: N/A;  . MINIMALLY INVASIVE TRICUSPID VALVE REPAIR Right 05/23/2013   Procedure: MINIMALLY INVASIVE TRICUSPID VALVE REPAIR;  Surgeon: Rexene Alberts, MD;  Location: [place];  Service: Open Heart Surgery;  Laterality: Right;  . MITRAL VALVE REPLACEMENT N/A 05/23/2013   Procedure: MITRAL VALVE (MV) REPLACEMENT;  Surgeon: Rexene Alberts, MD;  Location: [place];  Service: Open Heart Surgery;  Laterality: N/A;  . MULTIPLE EXTRACTIONS WITH ALVEOLOPLASTY  N/A 04/04/2013   Procedure: Extraction of tooth #'s 1,8,9,13,14,15,23,24,25,26 with alveoloplasty and gross debridement of remaining teeth;  Surgeon: Lenn Cal, DDS;  Location: [place];  Service: Oral Surgery;  Laterality: N/A;  . TEE WITHOUT CARDIOVERSION  N/A 03/18/2013   Procedure: TRANSESOPHAGEAL ECHOCARDIOGRAM (TEE);  Surgeon: Larey Dresser, MD;  Location: [place];  Service: Cardiovascular;  Laterality: N/A;  . TEE WITHOUT CARDIOVERSION N/A 06/17/2013   Procedure: TRANSESOPHAGEAL ECHOCARDIOGRAM (TEE);  Surgeon: Lelon Perla, MD;  Location: [facility] ENDOSCOPY;  Service: Cardiovascular;  Laterality: N/A;  . TUBAL LIGATION  1999    Family History  Problem Relation Age of Onset  . Stroke Father   . Cancer Father   . Sarcoidosis Sister   . Heart attack Neg Hx   . Colon cancer Neg Hx   . Stomach cancer Neg Hx   . Pancreatic cancer Neg Hx   . Esophageal cancer Neg Hx     Social History   Socioeconomic History  . Marital status: Single    Spouse name: Not on file  . Number of children: Not on file  . Years of education: Not on file  . Highest education level: Not on file  Occupational History  . Not on file  Tobacco Use  . Smoking status: Former Smoker    Packs/day: 0.50    Years: 36.00    Pack years: 18.00    Types: Cigarettes    Quit date: 03/15/2013    Years since quitting: 7.4  . Smokeless tobacco: Never Used  Vaping Use  . Vaping Use: Never used  Substance and Sexual Activity  . Alcohol use: Yes    Comment:  "glass of wine"  . Drug use: No  . Sexual activity: Never  Other Topics Concern  . Not on file  Social History Narrative  . Not on file   Social Determinants of Health   Financial Resource Strain:   . Difficulty of Paying Living Expenses: Not on file  Food Insecurity:   . Worried About Charity fundraiser in the Last Year: Not on file  . Ran Out of Food in the Last Year: Not on file  Transportation Needs:   . Lack of Transportation (Medical): Not on file  . Lack of Transportation (Non-Medical): Not on file  Physical Activity:   . Days of Exercise per Week: Not on file  . Minutes of Exercise per Session: Not on file  Stress:   . Feeling of Stress : Not on file  Social Connections:   . Frequency of  Communication with Friends and Family: Not on file  . Frequency of Social Gatherings with Friends and Family: Not on file  . Attends Religious Services: Not on file  . Active Member of Clubs or Organizations: Not on file  . Attends Archivist Meetings: Not on file  . Marital Status: Not on file  Intimate Partner Violence:   . Fear of Current or Ex-Partner: Not on file  . Emotionally Abused: Not on file  . Physically Abused: Not on file  . Sexually Abused: Not on file    Outpatient Medications Prior to Visit  Medication Sig Dispense Refill  . acetaminophen (TYLENOL) 500 MG tablet Take 1 tablet (500 mg total) by mouth every 6 (six) hours as needed for mild pain or headache.  0  . Cyanocobalamin (VITAMIN B-12) 500 MCG TABS Take 500 mcg by mouth daily. 60 tablet 0  . Hyprom-Naphaz-Polysorb-Zn Sulf (CLEAR EYES COMPLETE OP) Place  2 drops into both eyes daily as needed (red eyes).    . thiamine 100 MG tablet Take 1 tablet (100 mg total) by mouth daily. 30 tablet 0  . Vitamin D, Ergocalciferol, (DRISDOL) 1.25 MG (50000 UNIT) CAPS capsule Take 1 capsule (50,000 Units total) by mouth every 7 (seven) days. 12 capsule 1  . warfarin (COUMADIN) 5 MG tablet Take 7.5mg  daily for 3days then based on INR as directed by Coumadin clinic    . enoxaparin (LOVENOX) 40 MG/0.4ML injection Take 50mg  Subcutaneous twice daily for 3days and then based on INR check on Thurday 12/2, stop lovenox when LAG>5.3 10 mL 0  . folic acid (FOLVITE) 1 MG tablet Take 1 mg by mouth daily.    [name] gabapentin (NEURONTIN) 100 MG capsule Take 1 capsule (100 mg total) by mouth 2 (two) times daily. And continue 300 mg at bedtime 180 capsule 3  . gabapentin (NEURONTIN) 300 MG capsule Take 1 capsule (300 mg total) by mouth at bedtime. As needed for nerve pain  0  . pantoprazole (PROTONIX) 40 MG tablet Take 1 tablet (40 mg total) by mouth daily. 90 tablet 0   No facility-administered medications prior to visit.    Allergies   Allergen Reactions  . Oxycodone Itching  . Tape Rash and Other (See Comments)    THE ONLY TAPE THAT IS TOLERATED IS PAPER TAPE. EKG leads inflame the skin    ROS Review of Systems  Constitutional: Negative.   HENT: Positive for drooling.   Eyes: Negative.   Respiratory: Negative for shortness of breath.   Cardiovascular: Positive for leg swelling. Negative for chest pain.  Gastrointestinal: Negative for nausea and vomiting.  Endocrine: Negative.   Genitourinary: Negative.   Musculoskeletal: Negative.   Skin: Negative.   Allergic/Immunologic: Negative.   Neurological: Negative.   Hematological: Negative.   Psychiatric/Behavioral: Negative.       Objective:    Physical Exam Vitals and nursing note reviewed.  Constitutional:      General: She is not in acute distress.    Appearance: Normal appearance. She is not ill-appearing.  HENT:     Head: Normocephalic and atraumatic.     Right Ear: External ear normal.     Left Ear: External ear normal.     Nose: Nose normal.     Mouth/Throat:     Comments: Constant drooling Eyes:     Extraocular Movements: Extraocular movements intact.     Conjunctiva/sclera: Conjunctivae normal.     Pupils: Pupils are equal, round, and reactive to light.  Cardiovascular:     Rate and Rhythm: Normal rate and regular rhythm.     Pulses: Normal pulses.     Heart sounds: Normal heart sounds.  Pulmonary:     Effort: Pulmonary effort is normal.     Breath sounds: Normal breath sounds.  Abdominal:     General: Abdomen is flat.     Palpations: Abdomen is soft.  Musculoskeletal:        General: Normal range of motion.     Cervical back: Normal range of motion and neck supple.     Right lower leg: 1+ Edema present.     Left lower leg: 1+ Edema present.  Skin:    General: Skin is warm and dry.  Neurological:     General: No focal deficit present.     Mental Status: She is alert.  Psychiatric:        Mood and Affect: Mood normal.  Behavior: Behavior normal.        Thought Content: Thought content normal.        Judgment: Judgment normal.     BP (!) 150/91 (BP Location: Right Arm, Patient Position: Sitting, Cuff Size: Normal)   Pulse 78   Temp 98.7 F (37.1 C) (Oral)   Resp 18   Ht 5\' 2"  (1.575 m)   Wt 124 lb (56.2 kg)   LMP 03/22/2013   SpO2 100%   BMI 22.68 kg/m  Wt Readings from Last 3 Encounters:  08/28/20 125 lb (56.7 kg)  08/27/20 124 lb (56.2 kg)  08/18/20 113 lb 1.5 oz (51.3 kg)     Health Maintenance Due  Topic Date Due  . TETANUS/TDAP  Never done  . PAP SMEAR-Modifier  07/28/2007  . MAMMOGRAM  Never done    There are no preventive care reminders to display for this patient.  Lab Results  Component Value Date   TSH 1.660 08/28/2020   Lab Results  Component Value Date   WBC 7.5 08/28/2020   HGB 9.8 (L) 08/28/2020   HCT 27.2 (L) 08/28/2020   MCV 110 (H) 08/28/2020   PLT 434 08/28/2020   Lab Results  Component Value Date   NA 142 08/28/2020   K 4.5 08/28/2020   CO2 22 08/28/2020   GLUCOSE 97 08/28/2020   BUN 4 (L) 08/28/2020   CREATININE 0.83 08/28/2020   BILITOT 0.6 08/23/2020   ALKPHOS 65 08/23/2020   AST 18 08/23/2020   ALT 17 08/23/2020   PROT 4.9 (L) 08/23/2020   ALBUMIN 2.3 (L) 08/23/2020   CALCIUM 8.9 08/28/2020   ANIONGAP 10 08/23/2020   GFR 83.38 01/01/2015   Lab Results  Component Value Date   CHOL 189 05/31/2018   Lab Results  Component Value Date   HDL 68 05/31/2018   Lab Results  Component Value Date   LDLCALC 61 05/31/2018   Lab Results  Component Value Date   TRIG 300 (H) 05/31/2018   Lab Results  Component Value Date   CHOLHDL 2.8 05/31/2018   Lab Results  Component Value Date   HGBA1C 4.2 (L) 08/18/2020      Assessment & Plan:   Problem List Items Addressed This Visit      Other   Paresthesia - Primary   Numbness    Other Visit Diagnoses    Neuropathy       Relevant Medications   folic acid (FOLVITE) 1 MG tablet    gabapentin (NEURONTIN) 100 MG capsule   gabapentin (NEURONTIN) 300 MG capsule   pantoprazole (PROTONIX) 40 MG tablet    1. Neuropathy Patient assistance was given to help fill medication dispensers for the next week.  Encourage patient to reach out to neighbor to help remind and verify she is taking medication correctly.  Neurology appointment is currently pending.  Eden Lathe was present and helped patient with application for legal aide   Patient has appointment to establish care with Dr. Wynetta Emery next week, previously patient of Dr. Chapman Fitch who is no longer at this clinic  Patient education given to follow low-sodium diet, continue to keep feet elevated at rest, Lasix was discontinued in the hospital due to euvolemic state  - folic acid (FOLVITE) 1 MG tablet; Take 1 tablet (1 mg total) by mouth daily.  Dispense: 30 tablet; Refill: 2 - gabapentin (NEURONTIN) 100 MG capsule; Take 1 capsule (100 mg total) by mouth 2 (two) times daily. And continue 300 mg at bedtime  Dispense: 180 capsule; Refill: 3 - gabapentin (NEURONTIN) 300 MG capsule; Take 1 capsule (300 mg total) by mouth at bedtime.  Dispense: 30 capsule; Refill: 0 - pantoprazole (PROTONIX) 40 MG tablet; Take 1 tablet (40 mg total) by mouth daily.  Dispense: 90 tablet; Refill: 0  2. Paresthesia   3. Numbness   I have reviewed the patient's medical history (PMH, PSH, Social History, Family History, Medications, and allergies) , and have been updated if relevant. I spent 30 minutes reviewing chart and  face to face time with patient.     Meds ordered this encounter  Medications  . DISCONTD: gabapentin (NEURONTIN) 300 MG capsule    Sig: Take 1 capsule (300 mg total) by mouth at bedtime.    Dispense:  30 capsule    Refill:  0    Order Specific Question:   Supervising Provider    Answer:   Asencion Noble E [[id number]]  . DISCONTD: folic acid (FOLVITE) 1 MG tablet    Sig: Take 1 tablet (1 mg total) by mouth daily.    Dispense:  30  tablet    Refill:  2    Order Specific Question:   Supervising Provider    Answer:   Joya Gaskins, PATRICK E [[id number]]  . DISCONTD: pantoprazole (PROTONIX) 40 MG tablet    Sig: Take 1 tablet (40 mg total) by mouth daily.    Dispense:  90 tablet    Refill:  0    Order Specific Question:   Supervising Provider    Answer:   Joya Gaskins, PATRICK E [[id number]]  . DISCONTD: gabapentin (NEURONTIN) 100 MG capsule    Sig: Take 1 capsule (100 mg total) by mouth 2 (two) times daily. And continue 300 mg at bedtime    Dispense:  180 capsule    Refill:  3    Order Specific Question:   Supervising Provider    Answer:   Asencion Noble E [[id number]]  . folic acid (FOLVITE) 1 MG tablet    Sig: Take 1 tablet (1 mg total) by mouth daily.    Dispense:  30 tablet    Refill:  2  . gabapentin (NEURONTIN) 100 MG capsule    Sig: Take 1 capsule (100 mg total) by mouth 2 (two) times daily. And continue 300 mg at bedtime    Dispense:  180 capsule    Refill:  3  . gabapentin (NEURONTIN) 300 MG capsule    Sig: Take 1 capsule (300 mg total) by mouth at bedtime.    Dispense:  30 capsule    Refill:  0  . pantoprazole (PROTONIX) 40 MG tablet    Sig: Take 1 tablet (40 mg total) by mouth daily.    Dispense:  90 tablet    Refill:  0    Follow-up: No follow-ups on file.    Loraine Grip Mayers, PA-C

## 2020-08-28 ENCOUNTER — Encounter: Payer: Self-pay | Admitting: Physician Assistant

## 2020-08-28 ENCOUNTER — Ambulatory Visit (INDEPENDENT_AMBULATORY_CARE_PROVIDER_SITE_OTHER): Payer: Self-pay | Admitting: Physician Assistant

## 2020-08-28 VITALS — BP 132/62 | HR 114 | Ht 62.0 in | Wt 125.0 lb

## 2020-08-28 DIAGNOSIS — D649 Anemia, unspecified: Secondary | ICD-10-CM

## 2020-08-28 DIAGNOSIS — I5032 Chronic diastolic (congestive) heart failure: Secondary | ICD-10-CM

## 2020-08-28 DIAGNOSIS — Z952 Presence of prosthetic heart valve: Secondary | ICD-10-CM

## 2020-08-28 DIAGNOSIS — I471 Supraventricular tachycardia: Secondary | ICD-10-CM

## 2020-08-28 DIAGNOSIS — R000 Tachycardia, unspecified: Secondary | ICD-10-CM

## 2020-08-28 DIAGNOSIS — Z8679 Personal history of other diseases of the circulatory system: Secondary | ICD-10-CM

## 2020-08-28 MED ORDER — FUROSEMIDE 40 MG PO TABS: 20.0000 mg | ORAL_TABLET | Freq: Every day | ORAL | 1 refills | Status: DC

## 2020-08-28 NOTE — Patient Instructions (Addendum)
Medication Instructions:  Your physician has recommended you make the following change in your medication:  1.  RESTART Lasix 40 mg taking ONLY 1/2 TABLET ONLY AS NEEDED FOR SWELLING  *If you need a refill on your cardiac medications before your next appointment, please call your pharmacy*   Lab Work: TODAY:  BMET, CBC, & TSH  If you have labs (blood work) drawn today and your tests are completely normal, you will receive your results only by: [name] MyChart Message (if you have MyChart) OR . A paper copy in the mail If you have any lab test that is abnormal or we need to change your treatment, we will call you to review the results.   Testing/Procedures: None ordered   Follow-Up: At [facility], you and your health needs are our priority.  As part of our continuing mission to provide you with exceptional heart care, we have created designated Provider Care Teams.  These Care Teams include your primary Cardiologist (physician) and Advanced Practice Providers (APPs -  Physician Assistants and Nurse Practitioners) who all work together to provide you with the care you need, when you need it.  We recommend signing up for the patient portal called "MyChart".  Sign up information is provided on this After Visit Summary.  MyChart is used to connect with patients for Virtual Visits (Telemedicine).  Patients are able to view lab/test results, encounter notes, upcoming appointments, etc.  Non-urgent messages can be sent to your provider as well.   To learn more about what you can do with MyChart, go to [email or web address].    Your next appointment:   3 month(s)    11/27/2020 arrive at 1:25 to see Dr. Marlou Porch  The format for your next appointment:   In Person  Provider:   Candee Furbish, MD   Other Instructions Endocarditis Information  You may be at risk for developing endocarditis since you have an artificial heart valve or a repaired heart valve. Endocarditis is an infection of the lining  of the heart or heart valves. Certain surgical and dental procedures may put you at risk, such as teeth cleaning or other dental procedures or other medical procedures. Notify your doctor or dentist before having any invasive or surgical procedures. You will need to take antibiotics before certain procedures. To prevent endocarditis, maintain good oral health. Seek prompt medical attention for any mouth/gum, skin or urinary tract infections.

## 2020-08-29 LAB — CBC
Hematocrit: 27.2 % — ABNORMAL LOW (ref 34.0–46.6)
Hemoglobin: 9.8 g/dL — ABNORMAL LOW (ref 11.1–15.9)
MCH: 39.5 pg — ABNORMAL HIGH (ref 26.6–33.0)
MCHC: 36 g/dL — ABNORMAL HIGH (ref 31.5–35.7)
MCV: 110 fL — ABNORMAL HIGH (ref 79–97)
Platelets: 434 10*3/uL (ref 150–450)
RBC: 2.48 x10E6/uL — CL (ref 3.77–5.28)
RDW: 19.6 % — ABNORMAL HIGH (ref 11.7–15.4)
WBC: 7.5 10*3/uL (ref 3.4–10.8)

## 2020-08-29 LAB — BASIC METABOLIC PANEL
BUN/Creatinine Ratio: 5 — ABNORMAL LOW (ref 9–23)
BUN: 4 mg/dL — ABNORMAL LOW (ref 6–24)
CO2: 22 mmol/L (ref 20–29)
Calcium: 8.9 mg/dL (ref 8.7–10.2)
Chloride: 108 mmol/L — ABNORMAL HIGH (ref 96–106)
Creatinine, Ser: 0.83 mg/dL (ref 0.57–1.00)
GFR calc Af Amer: 91 mL/min/{1.73_m2} (ref >59)
GFR calc non Af Amer: 79 mL/min/{1.73_m2} (ref >59)
Glucose: 97 mg/dL (ref 65–99)
Potassium: 4.5 mmol/L (ref 3.5–5.2)
Sodium: 142 mmol/L (ref 134–144)

## 2020-08-29 LAB — TSH: TSH: 1.66 u[IU]/mL (ref 0.450–4.500)

## 2020-08-29 LAB — VITAMIN B1: Vitamin B1 (Thiamine): 56.6 nmol/L — ABNORMAL LOW (ref 66.5–200.0)

## 2020-09-01 ENCOUNTER — Ambulatory Visit: Payer: Self-pay | Attending: Internal Medicine | Admitting: Internal Medicine

## 2020-09-01 ENCOUNTER — Encounter: Payer: Self-pay | Admitting: Internal Medicine

## 2020-09-01 ENCOUNTER — Other Ambulatory Visit: Payer: Self-pay

## 2020-09-01 VITALS — BP 132/85 | HR 100 | Temp 98.0°F | Resp 16 | Wt 120.4 lb

## 2020-09-01 DIAGNOSIS — K068 Other specified disorders of gingiva and edentulous alveolar ridge: Secondary | ICD-10-CM

## 2020-09-01 DIAGNOSIS — F1021 Alcohol dependence, in remission: Secondary | ICD-10-CM

## 2020-09-01 DIAGNOSIS — G621 Alcoholic polyneuropathy: Secondary | ICD-10-CM

## 2020-09-01 DIAGNOSIS — E538 Deficiency of other specified B group vitamins: Secondary | ICD-10-CM

## 2020-09-01 DIAGNOSIS — D539 Nutritional anemia, unspecified: Secondary | ICD-10-CM

## 2020-09-01 DIAGNOSIS — Z1231 Encounter for screening mammogram for malignant neoplasm of breast: Secondary | ICD-10-CM

## 2020-09-01 DIAGNOSIS — E519 Thiamine deficiency, unspecified: Secondary | ICD-10-CM

## 2020-09-01 MED ORDER — MAGIC MOUTHWASH W/LIDOCAINE: 5.0000 mL | Freq: Three times a day (TID) | ORAL | 0 refills | Status: AC | PRN

## 2020-09-01 MED FILL — CMPD NYS:DPH:LIDO 1:1:1: 4 days supply | Qty: 60 | Fill #0

## 2020-09-01 NOTE — Progress Notes (Signed)
Pt states she is having some discomfort in her gums

## 2020-09-01 NOTE — Progress Notes (Signed)
Patient ID: Mary [name], female    DOB: 04/10/1961  MRN: [id number]  CC: re-establish   Subjective: Mary [name] is a 58 y.o. female who presents for chronic disease management.  Previous PCP was Dr. Chapman Fitch who is no longer with the practice. Her concerns today include:  Patient with history of mechanical mitral valve and tricuspid repair (2014) on chronic Coumadin, history of SVT, EtOH abuse, diastolic CHF, macrocytic anemia, documented B12 deficiency erosive gastritis, pyloric stenosis  Recently seen by PA post hospitalization.  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   Vitamin M08, folic acid and B6 levels normal.   Vitamin B1/thiamine level was low.    Methylmalonic acid level mildly elevated.   MRI of the head noted for few small microhemorrhages which were not thought to be significant.   Spinal tap studies unremarkable.  SPEP nl  Patient was treated with high-dose thiamine and vitamin B12 inpatient and was discharged home on thiamine and vitamin B12.  Gabapentin was resumed at a lower dose.  Abstinence from EtOH was strongly encouraged..  Today:  VHD:  On Coumadin with last INR 6 days ago being 2.3. Level was checked by home health nurse per pt.  Will see her again tomorrow to have level check.  Cardiologist is Dr. Marlou Porch; saw his PA 08/28/2020.  Noted to have LE edema.  Restarted on Lasix PRN.  LE edema better per pt; wgh down 6 lbs since that visit Endorses chronic SOB. Sleeps on 4-6 pillows Limits salt in foods.  Anemia: received 1 unit of blood on recent hosp per pt Hb improved from 8.1 to 9.8 on recent check.  MCV elev. she has had recent endoscopy and colonoscopy.  Endoscopy revealing pyloric stenosis and mild nonspecific gastritis.  Biopsy revealed no dysplasia or malignancy.  Colonoscopy was negative. -Reports compliance with taking folic acid, thiamine  and vitamin B12.  She has her medication bottles with her today. -Referred to allergy by Dr. Chapman Fitch in October of this year.  The referral was closed because patient did not return the call.  Patient states that she does not answer her phone if she does not recognize the number.  ETOH use:  Stopped completely since hospitalization last month. Still having numbness in the lower extremities from the knees down and in her hands and around the lips. -Plan is to get her in with [facility] neurology for EMG studies.  Referral was submitted.  Awaiting appointment. -Our caseworker has referred her to legal aid.   Patient Active Problem List   Diagnosis Date Noted  . Numbness   . Paresthesia   . Folic acid deficiency 67/61/9509  . H/O mitral valve replacement   . GERD (gastroesophageal reflux disease)   . Lactic acid acidosis   . Marijuana abuse   . Macrocytic anemia 04/04/2019  . Intermittent complete heart block ([place])   . Syncope 03/28/2019  . Volume depletion 05/24/2018  . Acute kidney injury ([place]) 05/23/2018  . Orthostatic hypotension 01/11/2017  . Chronic hyponatremia 01/11/2017  . Elevated liver enzymes 01/11/2017  . Atypical chest pain 06/05/2016  . Palpitations 03/20/2016  . Sinus tachycardia 02/01/2016  . AKI (acute kidney injury) ([place]) 01/10/2016  . Supratherapeutic INR 01/10/2016  . TIA (transient ischemic attack) 09/13/2015  . History of rheumatic heart disease 09/13/2015  . CVA (cerebral vascular accident) ([place]) 09/12/2015  . SOB (shortness of breath) 02/24/2014  .  First degree heart block 06/17/2013  . Weakness 06/16/2013  . (HFpEF) heart failure with preserved ejection fraction ([place]) 06/16/2013  . Anemia 06/16/2013  . Thrombocytosis 06/16/2013  . History of bacterial endocarditis 06/16/2013  . Heart valve replaced by other means 06/10/2013  . Warfarin anticoagulation 06/10/2013  . History of complete heart block 05/28/2013  . S/P mitral valve replacement with metallic  valve 35/57/3220  . S/P minimally-invasive tricuspid valve repair 05/23/2013  . Femoral hernia 05/23/2013  . Mitral valve insufficiency 04/22/2013  . Mitral valve disorders(424.0) 04/22/2013  . Tricuspid regurgitation 04/22/2013  . 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 SVT (supraventricular tachycardia) ([place]) 03/16/2013     Current Outpatient Medications on File Prior to Visit  Medication Sig Dispense Refill  . acetaminophen (TYLENOL) 500 MG tablet Take 1 tablet (500 mg total) by mouth every 6 (six) hours as needed for mild pain or headache.  0  . Cyanocobalamin (VITAMIN B-12) 500 MCG TABS Take 500 mcg by mouth daily. 60 tablet 0  . folic acid (FOLVITE) 1 MG tablet Take 1 tablet (1 mg total) by mouth daily. 30 tablet 2  . furosemide (LASIX) 40 MG tablet Take 0.5 tablets (20 mg total) by mouth daily. 45 tablet 1  . gabapentin (NEURONTIN) 100 MG capsule Take 1 capsule (100 mg total) by mouth 2 (two) times daily. And continue 300 mg at bedtime 180 capsule 3  . gabapentin (NEURONTIN) 300 MG capsule Take 1 capsule (300 mg total) by mouth at bedtime. 30 capsule 0  . Hyprom-Naphaz-Polysorb-Zn Sulf (CLEAR EYES COMPLETE OP) Place 2 drops into both eyes daily as needed (red eyes).    . pantoprazole (PROTONIX) 40 MG tablet Take 1 tablet (40 mg total) by mouth daily. 90 tablet 0  . thiamine 100 MG tablet Take 1 tablet (100 mg total) by mouth daily. 30 tablet 0  . Vitamin D, Ergocalciferol, (DRISDOL) 1.25 MG (50000 UNIT) CAPS capsule Take 1 capsule (50,000 Units total) by mouth every 7 (seven) days. 12 capsule 1  . warfarin (COUMADIN) 5 MG tablet Take 7.5mg  daily for 3days then based on INR as directed by Coumadin clinic     No current facility-administered medications on file prior to visit.    Allergies  Allergen Reactions  . Oxycodone Itching  . Tape Rash and Other (See Comments)    THE ONLY TAPE THAT IS TOLERATED IS PAPER TAPE. EKG leads inflame the skin    Social  History   Socioeconomic History  . Marital status: Single    Spouse name: Not on file  . Number of children: Not on file  . Years of education: Not on file  . Highest education level: Not on file  Occupational History  . Not on file  Tobacco Use  . Smoking status: Former Smoker    Packs/day: 0.50    Years: 36.00    Pack years: 18.00    Types: Cigarettes    Quit date: 03/15/2013    Years since quitting: 7.4  . Smokeless tobacco: Never Used  Vaping Use  . Vaping Use: Never used  Substance and Sexual Activity  . Alcohol use: Yes    Comment:  "glass of wine"  . Drug use: No  . Sexual activity: Never  Other Topics Concern  . Not on file  Social History Narrative  . Not on file   Social Determinants of Health   Financial Resource Strain:   . Difficulty of Paying Living Expenses: Not on file  Food  Insecurity:   . Worried About Charity fundraiser in the Last Year: Not on file  . Ran Out of Food in the Last Year: Not on file  Transportation Needs:   . Lack of Transportation (Medical): Not on file  . Lack of Transportation (Non-Medical): Not on file  Physical Activity:   . Days of Exercise per Week: Not on file  . Minutes of Exercise per Session: Not on file  Stress:   . Feeling of Stress : Not on file  Social Connections:   . Frequency of Communication with Friends and Family: Not on file  . Frequency of Social Gatherings with Friends and Family: Not on file  . Attends Religious Services: Not on file  . Active Member of Clubs or Organizations: Not on file  . Attends Archivist Meetings: Not on file  . Marital Status: Not on file  Intimate Partner Violence:   . Fear of Current or Ex-Partner: Not on file  . Emotionally Abused: Not on file  . Physically Abused: Not on file  . Sexually Abused: Not on file    Family History  Problem Relation Age of Onset  . Stroke Father   . Cancer Father   . Sarcoidosis Sister   . Heart attack Neg Hx   . Colon cancer Neg  Hx   . Stomach cancer Neg Hx   . Pancreatic cancer Neg Hx   . Esophageal cancer Neg Hx     Past Surgical History:  Procedure Laterality Date  . APPENDECTOMY  [id number]  . BIOPSY  05/01/2020   Procedure: BIOPSY;  Surgeon: Milus Banister, MD;  Location: [place];  Service: Endoscopy;;  . CARDIAC CATHETERIZATION    . CARDIAC VALVE REPLACEMENT    . CESAREAN SECTION  1983  . CESAREAN SECTION WITH BILATERAL TUBAL LIGATION  1999  . COLONOSCOPY WITH PROPOFOL N/A 05/01/2020   Procedure: COLONOSCOPY WITH PROPOFOL;  Surgeon: Milus Banister, MD;  [facility] ENDOSCOPY;  Service: Endoscopy;  Laterality: N/A;  . ESOPHAGOGASTRODUODENOSCOPY (EGD) WITH PROPOFOL N/A 05/01/2020   Procedure: ESOPHAGOGASTRODUODENOSCOPY (EGD) WITH PROPOFOL;  Surgeon: Milus Banister, MD;  Location[facility];  Service: Endoscopy;  Laterality: N/A;  . FEMORAL HERNIA REPAIR Right 05/23/2013   Procedure: HERNIA REPAIR FEMORAL;  Surgeon: Rexene Alberts, MD;  Location: [place];  Service: Open Heart Surgery;  Laterality: Right;  . INTRAOPERATIVE TRANSESOPHAGEAL ECHOCARDIOGRAM N/A 05/23/2013   Procedure: INTRAOPERATIVE TRANSESOPHAGEAL ECHOCARDIOGRAM;  Surgeon: Rexene Alberts, MD;  Location: [place];  Service: Open Heart Surgery;  Laterality: N/A;  . LAPAROSCOPIC CHOLECYSTECTOMY  2003  . LEFT AND RIGHT HEART CATHETERIZATION WITH CORONARY ANGIOGRAM N/A 03/22/2013   Procedure: LEFT AND RIGHT HEART CATHETERIZATION WITH CORONARY ANGIOGRAM;  Surgeon: Burnell Blanks, MD;  Location: [facility] CATH LAB;  Service: Cardiovascular;  Laterality: N/A;  . MINIMALLY INVASIVE TRICUSPID VALVE REPAIR Right 05/23/2013   Procedure: MINIMALLY INVASIVE TRICUSPID VALVE REPAIR;  Surgeon: Rexene Alberts, MD;  Location: [place];  Service: Open Heart Surgery;  Laterality: Right;  . MITRAL VALVE REPLACEMENT N/A 05/23/2013   Procedure: MITRAL VALVE (MV) REPLACEMENT;  Surgeon: Rexene Alberts, MD;  Location: [place];  Service: Open Heart Surgery;  Laterality: N/A;  .  MULTIPLE EXTRACTIONS WITH ALVEOLOPLASTY N/A 04/04/2013   Procedure: Extraction of tooth #'s 1,8,9,13,14,15,23,24,25,26 with alveoloplasty and gross debridement of remaining teeth;  Surgeon: Lenn Cal, DDS;  Location: [place];  Service: Oral Surgery;  Laterality: N/A;  . TEE WITHOUT CARDIOVERSION N/A  03/18/2013   Procedure: TRANSESOPHAGEAL ECHOCARDIOGRAM (TEE);  Surgeon: Larey Dresser, MD;  Location: [place];  Service: Cardiovascular;  Laterality: N/A;  . TEE WITHOUT CARDIOVERSION N/A 06/17/2013   Procedure: TRANSESOPHAGEAL ECHOCARDIOGRAM (TEE);  Surgeon: Lelon Perla, MD;  Location: [facility] ENDOSCOPY;  Service: Cardiovascular;  Laterality: N/A;  . TUBAL LIGATION  1999    ROS: Review of Systems Mouth: Complains of soreness in her gums.  Also reports having little small ulcers on the buccal mucosa.  Reports being given Magic mouthwash while in the hospital which she found helpful.  Requesting prescription for it.  PHYSICAL EXAM: BP 132/85   Pulse 100   Temp 98 F (36.7 C)   Resp 16   Wt 120 lb 6.4 oz (54.6 kg)   LMP 03/22/2013   SpO2 100%   BMI 22.02 kg/m   Wt Readings from Last 3 Encounters:  09/01/20 120 lb 6.4 oz (54.6 kg)  08/28/20 125 lb (56.7 kg)  08/27/20 124 lb (56.2 kg)    Physical Exam  General appearance - alert, well appearing, middle-aged older African-American female and in no distress.  Patient sitting in a wheelchair.  She has her walker with her. Mental status - normal mood, behavior, speech, dress, motor activity, and thought processes Eyes - pupils equal and reactive, extraocular eye movements intact Mouth: Thickened plaque buildup around the teeth.  No oral ulcers seen Neck - supple, no significant adenopathy Chest - clear to auscultation, no wheezes, rales or rhonchi, symmetric air entry Heart - normal rate, regular rhythm, normal S1, S2, no murmurs, rubs, clicks or gallops Musculoskeletal -gait not observed.  However she has her walker with  her. Extremities -trace to 1+ edema of the lower extremities.    CMP Latest Ref Rng & Units 08/28/2020 08/23/2020 08/22/2020  Glucose 65 - 99 mg/dL 97 82 84  BUN 6 - 24 mg/dL 4(L) 6 6  Creatinine 0.57 - 1.00 mg/dL 0.83 0.85 0.94  Sodium 134 - 144 mmol/L 142 141 139  Potassium 3.5 - 5.2 mmol/L 4.5 4.0 4.1  Chloride 96 - 106 mmol/L 108(H) 110 109  CO2 20 - 29 mmol/L 22 21(L) 21(L)  Calcium 8.7 - 10.2 mg/dL 8.9 8.5(L) 8.0(L)  Total Protein 6.5 - 8.1 g/dL - 4.9(L) -  Total Bilirubin 0.3 - 1.2 mg/dL - 0.6 -  Alkaline Phos 38 - 126 U/L - 65 -  AST 15 - 41 U/L - 18 -  ALT 0 - 44 U/L - 17 -   Lipid Panel     Component Value Date/Time   CHOL 189 05/31/2018 1108   TRIG 300 (H) 05/31/2018 1108   HDL 68 05/31/2018 1108   CHOLHDL 2.8 05/31/2018 1108   CHOLHDL 3.2 06/06/2016 0135   VLDL 22 06/06/2016 0135   LDLCALC 61 05/31/2018 1108    CBC    Component Value Date/Time   WBC 7.5 08/28/2020 1250   WBC 5.8 08/24/2020 0502   RBC 2.48 (LL) 08/28/2020 1250   RBC 2.02 (L) 08/24/2020 0502   HGB 9.8 (L) 08/28/2020 1250   HCT 27.2 (L) 08/28/2020 1250   PLT 434 08/28/2020 1250   MCV 110 (H) 08/28/2020 1250   MCH 39.5 (H) 08/28/2020 1250   MCH 40.1 (H) 08/24/2020 0502   MCHC 36.0 (H) 08/28/2020 1250   MCHC 34.8 08/24/2020 0502   RDW 19.6 (H) 08/28/2020 1250   LYMPHSABS 1.3 08/17/2020 1605   LYMPHSABS 1.9 06/26/2020 1218   MONOABS 0.3 08/17/2020 1605   EOSABS  0.1 08/17/2020 1605   EOSABS 0.2 06/26/2020 1218   BASOSABS 0.0 08/17/2020 1605   BASOSABS 0.0 06/26/2020 1218    ASSESSMENT AND PLAN: 1. Alcoholic peripheral neuropathy ([place]) 2. Moderate alcohol use disorder, in early remission ([place]) Commended her on quitting.  Encouraged her to remain free of alcohol use.  Discussed how alcohol may be causing or contributing to her neuropathy symptoms. -Await neurology appointment for EMG studies.  3. Macrocytic anemia 4. Vitamin B12 deficiency -Continue supplementation with O45, folic acid  and thiamine.  Plan to recheck B12 level on follow-up visit. - Methylmalonic acid, serum - Homocysteine  5. Thiamin deficiency See #4 above.  6. Pain in gums Encouraged her to see a dentist as soon as she is able to afford.  Looks like she has some gingivitis. - magic mouthwash w/lidocaine SOLN; Take 5 mLs by mouth 3 (three) times daily as needed for up to 3 days for mouth pain. Swish ans spit.  Do not swallow.  Dispense: 50 mL; Refill: 0  7. Encounter for screening mammogram for malignant neoplasm of breast - MM Digital Screening; Future     Patient was given the opportunity to ask questions.  Patient verbalized understanding of the plan and was able to repeat key elements of the plan.   No orders of the defined types were placed in this encounter.    Requested Prescriptions    No prescriptions requested or ordered in this encounter    No follow-ups on file.  Karle Plumber, MD, FACP

## 2020-09-02 ENCOUNTER — Telehealth: Payer: Self-pay | Admitting: *Deleted

## 2020-09-02 NOTE — Telephone Encounter (Signed)
Patient was due an INR on yesterday but Home Health. Called [facility] and they state the patient was not able to be reached yesterday and they were planning a visit today. Called the pt to ensure she knows he importance of having her INR completed in a timely manner and dosed appropriately and reminded her that the [facility] is planning to come today. Pt states well I'm not at home and out trying to get some assistance and has been waiting a while. She does not think she will be back home anytime soon so advised her to call her Home Health Nurse to let her know when she would be available. She stated she would do so and tomorrow would be good and she would let Jackelyn Poling, the Home Health Nurse know. Will monitor to ensure her INR gets completed soon.

## 2020-09-03 ENCOUNTER — Telehealth: Payer: Self-pay | Admitting: Internal Medicine

## 2020-09-03 ENCOUNTER — Telehealth: Payer: Self-pay | Admitting: *Deleted

## 2020-09-03 NOTE — Telephone Encounter (Signed)
Called [facility] to see if the pt had her INR checked today and they have her scheduled for Friday. Will await an INR result on Friday.

## 2020-09-03 NOTE — Telephone Encounter (Signed)
-----   Message from Eden Lathe, RN sent at 09/03/2020  9:16 AM EST ----- I contacted cardiology and they confirmed they were checking INR prior to home health.  They can continue to monitor after home health is done unless you prefer to monitor. ----- Message ----- From: [name], MD Sent: 09/02/2020   3:59 PM EST To: Eden Lathe, RN  I saw this patient yesterday for the first time. She is on Coumadin long-term. She tells me that a visiting nurse checked her INR a week ago and had plan to come back out today to recheck a level. It looks like [facility] is seeing her phone number is [phone]. She tells me that prior to that cardiology was checking her levels. I just wanted to clarify where she will be having her levels checked when the visiting nurse no longer comes out to see her. I do not want this to fall through the cracks.

## 2020-09-04 ENCOUNTER — Telehealth: Payer: Self-pay | Admitting: Cardiology

## 2020-09-04 ENCOUNTER — Ambulatory Visit (INDEPENDENT_AMBULATORY_CARE_PROVIDER_SITE_OTHER): Payer: Self-pay | Admitting: Internal Medicine

## 2020-09-04 DIAGNOSIS — Z5181 Encounter for therapeutic drug level monitoring: Secondary | ICD-10-CM

## 2020-09-04 LAB — POCT INR: INR: 3 (ref 2.0–3.0)

## 2020-09-04 NOTE — Telephone Encounter (Signed)
[facility] has called to state that the patient has been experiencing high BP. Stated that it was 145/110; 115 with an eight pound weight loss in the past week. Please call back to discuss

## 2020-09-04 NOTE — Patient Instructions (Signed)
Description   Spoke with Jackelyn Poling, RN with [facility]. Advised pt to continue taking 5mg  daily except 2.5mg  on Sundays and Wednesdays. Recheck INR in 1 week. Coumadin Clinic # [phone]   Please Bill Code [phone]

## 2020-09-04 NOTE — Telephone Encounter (Signed)
Debbie, [facility] RN is calling in due to patients elevated BP of 145/100 HR 115 and a 8 pound weight loss in the last week. Patient reports being dizzy at times, RN reports 1+ LE edema.   Patient denies SOB or any new symptoms. Patient has been taking her Lasix daily.   Will route to Dr. Marlou Porch for advisement.

## 2020-09-05 LAB — METHYLMALONIC ACID, SERUM: Methylmalonic Acid: 184 nmol/L (ref 0–378)

## 2020-09-05 LAB — HOMOCYSTEINE: Homocysteine: 17.6 umol/L — ABNORMAL HIGH (ref 0.0–14.5)

## 2020-09-05 NOTE — Progress Notes (Signed)
Let patient know that blood test shows that she not only has B12 deficiency but also folate deficiency.  Please make sure that she takes the vitamin T02 and folic acid supplements every day as prescribed.

## 2020-09-06 NOTE — Telephone Encounter (Signed)
Given her prior BP of 84/60 at last clinic visit, recommend continued monitoring.  Please notify PCP as well.  Thanks  Candee Furbish, MD

## 2020-09-07 ENCOUNTER — Telehealth: Payer: Self-pay

## 2020-09-07 NOTE — Telephone Encounter (Signed)
Debbie with [facility] advised and will call her PCP to make her an appointment.

## 2020-09-07 NOTE — Telephone Encounter (Signed)
Contacted pt to go over lab results pt is aware and doesn't have any questions or concerns 

## 2020-09-11 ENCOUNTER — Ambulatory Visit (INDEPENDENT_AMBULATORY_CARE_PROVIDER_SITE_OTHER): Payer: Medicaid Other

## 2020-09-11 ENCOUNTER — Other Ambulatory Visit: Payer: Self-pay

## 2020-09-11 DIAGNOSIS — Z7901 Long term (current) use of anticoagulants: Secondary | ICD-10-CM

## 2020-09-11 LAB — POCT INR: INR: 4.7 — AB (ref 2.0–3.0)

## 2020-09-11 NOTE — Patient Instructions (Addendum)
  Description   Skip today's dosage of Warfarin, then take 1/2 tablet tomorrow, then resume same dosage  5mg  daily except 2.5mg  on Sundays and Wednesdays. Recheck INR in 7-10 days. Coumadin Clinic # [phone]   Please Bill Code [phone]

## 2020-09-21 ENCOUNTER — Telehealth: Payer: Self-pay | Admitting: *Deleted

## 2020-09-21 NOTE — Telephone Encounter (Signed)
Called pt since she missed her appt today. Had to leave a message for the pt to call back. Will await a response or follow up from the pt.

## 2020-09-22 ENCOUNTER — Inpatient Hospital Stay: Payer: Medicaid Other | Admitting: Physician Assistant

## 2020-09-22 ENCOUNTER — Encounter: Payer: Self-pay | Admitting: *Deleted

## 2020-09-22 NOTE — Progress Notes (Signed)
This encounter was created in error - please disregard.

## 2020-09-23 ENCOUNTER — Telehealth: Payer: Self-pay | Admitting: Internal Medicine

## 2020-09-23 NOTE — Telephone Encounter (Signed)
Copied from CRM [id number]. Topic: Appointment Scheduling - Scheduling Inquiry for Clinic >> Sep 23, 2020  9:38 AM Crist Infante wrote: Reason for CRM: pt wants to know if she can come by tomorrow morning and have someone go through her meds with her and sort them out?  Pt states the nurse did it last time that came to her home, but that was her last visit. Pt did not know anything about her appt yesterday that she missed.   Is this something that the pharmacy could do with her or does she need an appt? I can contact patient if you let me know.

## 2020-09-23 NOTE — Telephone Encounter (Signed)
Please contact pt and schedule a medication management with Franky Macho for next week.

## 2020-09-23 NOTE — Telephone Encounter (Signed)
Mary [name] would be the best person to help with pill organizing

## 2020-09-23 NOTE — Telephone Encounter (Signed)
Called patient to offer medication management appointment with Franky Macho next week and patient declined. She stated that she just needs help distributing her pills into her pill organizer for the week. Patient also stated that she needed medication refills but can't see the labels to request them. I advised patient that she could go to the pharmacy to request refills but without an appointment nobody may be able to help her organize her medication.   Could we schedule a nurse visit for her around 9:30 or 10:00 tomorrow for her medication or does she need to be seen by Franky Macho? I advised patient I would reach back out to her before end of day.

## 2020-09-23 NOTE — Telephone Encounter (Signed)
Called patient again and she agreed to take an appointment with [facility]. Scheduled for Friday Jan 7th @ 2:00 pm.

## 2020-09-24 ENCOUNTER — Other Ambulatory Visit: Payer: Self-pay

## 2020-09-24 ENCOUNTER — Ambulatory Visit (INDEPENDENT_AMBULATORY_CARE_PROVIDER_SITE_OTHER): Payer: Self-pay | Admitting: *Deleted

## 2020-09-24 ENCOUNTER — Other Ambulatory Visit: Payer: Self-pay | Admitting: Physician Assistant

## 2020-09-24 DIAGNOSIS — G629 Polyneuropathy, unspecified: Secondary | ICD-10-CM

## 2020-09-24 DIAGNOSIS — Z7901 Long term (current) use of anticoagulants: Secondary | ICD-10-CM

## 2020-09-24 LAB — POCT INR: INR: 4.5 — AB (ref 2.0–3.0)

## 2020-09-24 MED FILL — GABAPENTIN 100 MG CAPSULE: 100 | 30 days supply | Qty: 60 | Fill #2

## 2020-09-24 MED FILL — FOLIC ACID 1 MG TABS: 1 | 30 days supply | Qty: 30 | Fill #1

## 2020-09-24 MED FILL — PANTOPRAZOLE SOD DR 40 MG T: 40 | 30 days supply | Qty: 30 | Fill #1

## 2020-09-24 NOTE — Patient Instructions (Signed)
Description   Do not take any Warfarin today and No Warfarin tomorrow then start taking Warfarin 2.5mg  daily except 5mg  on Monday, Wednesday, and Friday. Recheck INR in 7-10 days. Coumadin Clinic # [phone]   Please Bill Code [phone]

## 2020-09-30 ENCOUNTER — Other Ambulatory Visit: Payer: Self-pay | Admitting: Internal Medicine

## 2020-09-30 DIAGNOSIS — G629 Polyneuropathy, unspecified: Secondary | ICD-10-CM

## 2020-09-30 MED ORDER — GABAPENTIN 300 MG PO CAPS: 300.0000 mg | ORAL_CAPSULE | Freq: Every day | ORAL | 0 refills | Status: DC

## 2020-09-30 MED FILL — GABAPENTIN 300 MG CAPSULE: 300 | 30 days supply | Qty: 30 | Fill #0

## 2020-09-30 NOTE — Telephone Encounter (Signed)
Medication Refill - Medication:  gabapentin (NEURONTIN) 300 MG capsule   Has the patient contacted their pharmacy? yes (Agent: If no, request that the patient contact the pharmacy for the refill.) (Agent: If yes, when and what did the pharmacy advise?)Contact PCP  Preferred Pharmacy (with phone number or street name):  [facility] [place], [place] - [address] Phone:  [phone]  Fax:  [phone]       Agent: Please be advised that RX refills may take up to 3 business days. We ask that you follow-up with your pharmacy.

## 2020-10-02 ENCOUNTER — Ambulatory Visit: Payer: Medicaid Other | Admitting: Pharmacist

## 2020-10-06 ENCOUNTER — Ambulatory Visit: Payer: Self-pay | Attending: Internal Medicine | Admitting: Pharmacist

## 2020-10-06 ENCOUNTER — Encounter: Payer: Self-pay | Admitting: Pharmacist

## 2020-10-06 ENCOUNTER — Other Ambulatory Visit: Payer: Self-pay | Admitting: Internal Medicine

## 2020-10-06 ENCOUNTER — Telehealth: Payer: Self-pay

## 2020-10-06 ENCOUNTER — Other Ambulatory Visit: Payer: Self-pay

## 2020-10-06 ENCOUNTER — Ambulatory Visit (INDEPENDENT_AMBULATORY_CARE_PROVIDER_SITE_OTHER): Payer: Self-pay | Admitting: *Deleted

## 2020-10-06 DIAGNOSIS — G629 Polyneuropathy, unspecified: Secondary | ICD-10-CM

## 2020-10-06 DIAGNOSIS — Z79899 Other long term (current) drug therapy: Secondary | ICD-10-CM

## 2020-10-06 DIAGNOSIS — Z7901 Long term (current) use of anticoagulants: Secondary | ICD-10-CM

## 2020-10-06 LAB — POCT INR: INR: 4.2 — AB (ref 2.0–3.0)

## 2020-10-06 MED ORDER — GABAPENTIN 300 MG PO CAPS: 300.0000 mg | ORAL_CAPSULE | Freq: Every day | ORAL | 2 refills | Status: DC

## 2020-10-06 MED ORDER — FOLIC ACID 1 MG PO TABS: 1.0000 mg | ORAL_TABLET | Freq: Every day | ORAL | 2 refills | Status: DC

## 2020-10-06 NOTE — Telephone Encounter (Signed)
Met with the patient when she was in the clinic today.  She had paperwork from [facility] regarding an appeal for her disability claim.  The filing period had expired and she said she has not yet been contacted by [employer] [place]. Along with her SSA papers were eviction letters. She said that she had to go to court 10/01/2020 regarding the eviction.  She was in agreement to this CM contacting [facility] as [facility] for Housing and [employer].about the eviction. Patient confirmed her phone number # [phone].  She said she is considering moving to [place] to be with her mother if she is not able to stay in [place].   [employer] email sent to [facility] regarding the above SSA appeal and eviction notice and she said she would call the patient today.  Voicemail message left for Dr Joseph Art Norris/ [facility]  # [phone] regarding the eviction.  Call back requested to this CM.

## 2020-10-06 NOTE — Patient Instructions (Signed)
Description   Do not take any Warfarin today then start taking Warfarin 2.5mg  daily except 5mg  on Friday. Recheck INR in 10 days. Coumadin Clinic # [phone]   Please Bill Code [phone]

## 2020-10-06 NOTE — Progress Notes (Signed)
Patient presents for medication review and counseling. She was instructed to come in so I could fill her pillbox. Unfortunately, she does not have her pillbox with her today. I have provided her with one we have here and filled this for her. She has no questions concerning her medications. She verbalizes understanding of dosing and frequency for all medications.   Benard Halsted, PharmD, [facility] [place], [place] [phone]

## 2020-10-07 NOTE — Telephone Encounter (Signed)
Message received from [place], [facility] stating she left a voicemail message for the patient yesterday afternoon and has not yet heard back from her.  Call received from [facility] for [employer] and Community Studies.  Explained that patient has received an  eviction notice and  [facility] has already been contacted. Renee stated that she would contact the patient and inquire if they may be able to assist with obtaining funds for rent payment.  Provided her with patient's phone number.  Call placed to patient # [phone] to inform her of above and the importance of returning calls if she misses the call.  Message left with call back requested to this CM

## 2020-10-07 NOTE — Telephone Encounter (Signed)
Copied from [place] [phone]. Topic: General - Call Back - No Documentation >> Oct 07, 2020  3:02 PM Hinda Lenis D wrote: PT returning a call / no voicemail / please advise  I believe Patient may be calling back in reference to your encounter from today. Please advise

## 2020-10-07 NOTE — Telephone Encounter (Signed)
Call returned to the patient. She said she has not received messages from [facility] or [facility]. Provided her with the phone number for [facility] and instructed her to ask for Ms Florene Glen.  Also instructed her to call this CM back tomorrow afternoon if she has not heard from these agencies and she said she understood

## 2020-10-08 ENCOUNTER — Telehealth: Payer: Self-pay

## 2020-10-08 NOTE — Telephone Encounter (Signed)
Pt is calling jane back to let her know she did not heard from [facility] or [place] agencies and would like jane to call her

## 2020-10-08 NOTE — Telephone Encounter (Signed)
Email received from [facility]. She spoke to the patient and the patient will be going to [facility] office tomorrow to provide the documents she received from medicaid/SSA.  Message received from [facility] stating that she left a message this afternoon asking the patient to call her, .

## 2020-10-08 NOTE — Telephone Encounter (Signed)
Call returned to patient.  She explained that she has not heard from [facility] or [facility] about her eviction.   She said that she called [facility] yesterday and left 3 or 4 messages.  This CM called [facility] and left message requesting a call back to inquire if she has attempted to contact the patient. Email sent to [facility] inquiring if she has attempted to contact the patient.  Message received back stating that she received message from patient after hours yesterday and she is planning to call her this afternoon.  This information was shared with the patient.

## 2020-10-12 NOTE — Telephone Encounter (Signed)
Call placed to the patient. She explained that a representative from [facility] came to her house on Friday 10/09/2020 and took the disability paperwork for review. She also informed her that a colleague would be contacting her (patient) about the medicaid application.  Regarding housing, [facility] provided her with some paperwork that she needs to get notarized and then take to the courthouse.  She plans on doing that tomorrow.  She also noted that [facility] has contacted her and is also trying to assist with her housing issues.

## 2020-10-15 ENCOUNTER — Telehealth: Payer: Self-pay | Admitting: *Deleted

## 2020-10-15 NOTE — Telephone Encounter (Signed)
Called the pt since she missed today's appt. She stated she isn't feeling well and she has a cough and runny nose. Asked if had been tested for COVID and she stated she was vaccinated. Advised that she can still catch COVID and she should get checked out. She stated [facility] would [facility], advised that the she should go to a local testing site instead, gave her options and she stated that she is near a [facility] or [facility] and she would call her son to assist in booking her an appt. Will cancel today's appt and follow up with her in a few days.

## 2020-10-15 NOTE — Telephone Encounter (Signed)
This CM spoke to [employer], attorney/[employer]. She said she has visited the patient twice and they are working with the landlord to keep the patient housed.  They have involved the [facility] to assist with locating alternative housing, including identifying financing to assist the patient with her housing. She also noted that they may refer her to the [place] program if needed

## 2020-10-20 ENCOUNTER — Telehealth: Payer: Self-pay | Admitting: *Deleted

## 2020-10-20 NOTE — Telephone Encounter (Signed)
Called pt since she is overdue for her INR check; there was no answer so left her message on the voicemail to call back with an update on when she can make an appt and regarding how she is feeling since my last call lat last week. Will await a response from the pt.

## 2020-10-22 ENCOUNTER — Ambulatory Visit: Payer: Medicaid Other | Admitting: Internal Medicine

## 2020-10-27 ENCOUNTER — Ambulatory Visit: Payer: Medicaid Other | Admitting: Internal Medicine

## 2020-10-27 ENCOUNTER — Inpatient Hospital Stay: Admission: RE | Admit: 2020-10-27 | Payer: Medicaid Other | Source: Ambulatory Visit

## 2020-10-27 ENCOUNTER — Telehealth: Payer: Self-pay | Admitting: Internal Medicine

## 2020-10-27 NOTE — Telephone Encounter (Signed)
Copied from [place] [id number]. Topic: General - Other >> Oct 26, 2020 12:05 PM Antonieta Iba C wrote: Reason for CRM: Agnes Lawrence- [phone] (email: [email or web address]) - social worker with legal aid is calling in to make provider aware that pt is in need of having a FL2 form completed for housing. He will call back in to provide the name and information of the location. Pt has a telephone visit scheduled for tomorrow with provider. Aline Brochure mentioned that pt has a cough.

## 2020-10-27 NOTE — Telephone Encounter (Signed)
Call returned to [facility] regarding FL2. Message left with call back requested to this CM.

## 2020-10-28 NOTE — Telephone Encounter (Signed)
Another call placed to [facility] regarding FL2.  Additional information is needed. Message left with call back requested to this CM

## 2020-10-29 NOTE — Telephone Encounter (Signed)
Call received from [facility], [facility].  He explained that they are working on expediting the patient's application for SSI/Medicaid and they are also trying to assist her with housing. She is currently living on the second floor of her apartment and could benefit from a first floor unit.  He is requesting an FL2 for housing.   This CM explained to him that the patient will need to identify housing options that she is interested in. She will also need to have an income to pay rent. He was not sure if she needs independent or assisted living.  Explained to him that the [facility] would be for assisted living programs. There is no place to send an FL2 at this time.  He requested that this CM call patient to explain status of housing and need to have income and then call him back to notify him that this has been discussed with the patient.   Attempted to contact patient # [phone], message left with call back requested to this CM.

## 2020-11-02 MED FILL — FOLIC ACID 1 MG TABS: 1 | 30 days supply | Qty: 30 | Fill #2

## 2020-11-02 MED FILL — FUROSEMIDE 40 MG TAB: 40 | 30 days supply | Qty: 30 | Fill #1

## 2020-11-02 MED FILL — PANTOPRAZOLE SOD DR 40 MG T: 40 | 30 days supply | Qty: 30 | Fill #2

## 2020-11-02 MED FILL — GABAPENTIN 300 MG CAPSULE: 300 | 30 days supply | Qty: 30 | Fill #0

## 2020-11-05 ENCOUNTER — Telehealth: Payer: Self-pay

## 2020-11-05 NOTE — Telephone Encounter (Signed)
Call placed to patient to discuss housing options, including ALF.  She does not have an income for ALF at this time and she said she is not interested anyway. She explained that Legal Aid is helping her with rent.  She has tried to reach [facility] but has not been able to reach him and she asked this CM to have him call her.  Call placed to [facility] # [phone] and the message stated that the call cannot be completed at this time. SECURE email sent to Mr Frederico Hamman requesting he call the patient.

## 2020-11-10 ENCOUNTER — Telehealth: Payer: Self-pay

## 2020-11-10 NOTE — Telephone Encounter (Signed)
SECURE email received from [facility] stating that he submitted and application for the patient  for a voucher based housing program through [employer]

## 2020-11-12 ENCOUNTER — Telehealth: Payer: Self-pay | Admitting: *Deleted

## 2020-11-12 ENCOUNTER — Telehealth: Payer: Self-pay | Admitting: Internal Medicine

## 2020-11-12 NOTE — Telephone Encounter (Signed)
Pt returned a call back to the [facility]ticoagulation Clinic and stated that she hasn't been to her anticoagulation appts because she cannot get up and down the steps where she resides. Advised that I am sorry she is having trouble getting around but she should reach out to her Primary care Physician to see if there are any options for Home Health or any assistance. Also, advised that her INR needs to be monitored cause her INR can get too high and she would be at risk for bleeding problems and if it too low she will be at risk for a stroke or blood clot; she verbalized understanding. She stated she will contact them. Advised I will call her back in a few days to follow up with her.

## 2020-11-12 NOTE — Telephone Encounter (Signed)
Call returned to patient # [phone].  Message left with call back requested to this CM

## 2020-11-12 NOTE — Telephone Encounter (Signed)
Copied from [place] [id number]. Topic: General - Other >> Nov 12, 2020  2:06 PM Tessa Lerner A wrote: Reason for CRM: Patient is requesting to be contacted by Etter Sjogren when available Patient has requested to be contacted regarding assistance with medications Please contact to advise, if possible

## 2020-11-12 NOTE — Telephone Encounter (Signed)
Called pt since she is overdue for Anticoagulation Appt; left a message to call back to reschedule.

## 2020-11-13 NOTE — Telephone Encounter (Signed)
Patient returned a call to Opal Sidles can be reached at Ph# [phone]

## 2020-11-16 ENCOUNTER — Ambulatory Visit (INDEPENDENT_AMBULATORY_CARE_PROVIDER_SITE_OTHER): Payer: Medicaid Other | Admitting: *Deleted

## 2020-11-16 ENCOUNTER — Telehealth: Payer: Self-pay

## 2020-11-16 ENCOUNTER — Other Ambulatory Visit: Payer: Self-pay

## 2020-11-16 DIAGNOSIS — Z7901 Long term (current) use of anticoagulants: Secondary | ICD-10-CM

## 2020-11-16 LAB — POCT INR: INR: 1.8 — AB (ref 2.0–3.0)

## 2020-11-16 MED ORDER — WARFARIN SODIUM 5 MG PO TABS: ORAL_TABLET | ORAL | 0 refills | Status: DC

## 2020-11-16 NOTE — Telephone Encounter (Signed)
Call returned to patient. She explained that she did not have any questions about her medications. She would bring them to her upcoming appointment to review. Explained to her that she can meet with Benard Halsted, [place] if she would like to.  She went to INR check today and will plan to follow up with the coumadin clinic again next week. She also requested to be seen by Dr Wynetta Emery prior to appt already scheduled for4/14/2022.   Rescheduled appointment to 11/30/2020.   She said that [employer] [place] continues to work with her to address her issues with medicaid, disability and housing,

## 2020-11-16 NOTE — Patient Instructions (Addendum)
Description   Today and tomorrow take Warfarin 5mg  then continue taking Warfarin 2.5mg  daily except 5mg  on Friday. Recheck INR in 1 week. [facility] # [phone]   Please Bill Code [phone]

## 2020-11-17 ENCOUNTER — Other Ambulatory Visit: Payer: Self-pay | Admitting: Internal Medicine

## 2020-11-17 ENCOUNTER — Other Ambulatory Visit: Payer: Self-pay | Admitting: Pharmacist

## 2020-11-17 DIAGNOSIS — Z7901 Long term (current) use of anticoagulants: Secondary | ICD-10-CM

## 2020-11-17 MED ORDER — WARFARIN SODIUM 5 MG PO TABS: ORAL_TABLET | ORAL | 0 refills | Status: DC

## 2020-11-24 ENCOUNTER — Emergency Department (HOSPITAL_COMMUNITY): Payer: Medicaid Other

## 2020-11-24 ENCOUNTER — Other Ambulatory Visit: Payer: Self-pay

## 2020-11-24 ENCOUNTER — Inpatient Hospital Stay (HOSPITAL_COMMUNITY)
Admission: EM | Admit: 2020-11-24 | Discharge: 2020-12-03 | DRG: 378 | Disposition: A | Discharge status: Home Health | Payer: Medicaid Other | Attending: Internal Medicine | Admitting: Internal Medicine

## 2020-11-24 DIAGNOSIS — I442 Atrioventricular block, complete: Secondary | ICD-10-CM | POA: Diagnosis present

## 2020-11-24 DIAGNOSIS — R791 Abnormal coagulation profile: Secondary | ICD-10-CM

## 2020-11-24 DIAGNOSIS — Z885 Allergy status to narcotic agent status: Secondary | ICD-10-CM | POA: Diagnosis not present

## 2020-11-24 DIAGNOSIS — Z20822 Contact with and (suspected) exposure to covid-19: Secondary | ICD-10-CM | POA: Diagnosis present

## 2020-11-24 DIAGNOSIS — D689 Coagulation defect, unspecified: Secondary | ICD-10-CM | POA: Diagnosis present

## 2020-11-24 DIAGNOSIS — F1210 Cannabis abuse, uncomplicated: Secondary | ICD-10-CM | POA: Diagnosis present

## 2020-11-24 DIAGNOSIS — K311 Adult hypertrophic pyloric stenosis: Secondary | ICD-10-CM | POA: Diagnosis present

## 2020-11-24 DIAGNOSIS — E875 Hyperkalemia: Secondary | ICD-10-CM | POA: Diagnosis not present

## 2020-11-24 DIAGNOSIS — K920 Hematemesis: Principal | ICD-10-CM | POA: Diagnosis present

## 2020-11-24 DIAGNOSIS — M25451 Effusion, right hip: Secondary | ICD-10-CM | POA: Diagnosis present

## 2020-11-24 DIAGNOSIS — Z8673 Personal history of transient ischemic attack (TIA), and cerebral infarction without residual deficits: Secondary | ICD-10-CM

## 2020-11-24 DIAGNOSIS — I472 Ventricular tachycardia: Secondary | ICD-10-CM | POA: Diagnosis present

## 2020-11-24 DIAGNOSIS — K219 Gastro-esophageal reflux disease without esophagitis: Secondary | ICD-10-CM | POA: Diagnosis present

## 2020-11-24 DIAGNOSIS — I110 Hypertensive heart disease with heart failure: Secondary | ICD-10-CM | POA: Diagnosis present

## 2020-11-24 DIAGNOSIS — Z91048 Other nonmedicinal substance allergy status: Secondary | ICD-10-CM

## 2020-11-24 DIAGNOSIS — K760 Fatty (change of) liver, not elsewhere classified: Secondary | ICD-10-CM | POA: Diagnosis present

## 2020-11-24 DIAGNOSIS — R1110 Vomiting, unspecified: Secondary | ICD-10-CM

## 2020-11-24 DIAGNOSIS — K922 Gastrointestinal hemorrhage, unspecified: Secondary | ICD-10-CM | POA: Diagnosis present

## 2020-11-24 DIAGNOSIS — Z952 Presence of prosthetic heart valve: Secondary | ICD-10-CM | POA: Diagnosis not present

## 2020-11-24 DIAGNOSIS — R7989 Other specified abnormal findings of blood chemistry: Secondary | ICD-10-CM

## 2020-11-24 DIAGNOSIS — Z7901 Long term (current) use of anticoagulants: Secondary | ICD-10-CM | POA: Diagnosis not present

## 2020-11-24 DIAGNOSIS — I5030 Unspecified diastolic (congestive) heart failure: Secondary | ICD-10-CM

## 2020-11-24 DIAGNOSIS — N179 Acute kidney failure, unspecified: Secondary | ICD-10-CM | POA: Diagnosis present

## 2020-11-24 DIAGNOSIS — D539 Nutritional anemia, unspecified: Secondary | ICD-10-CM | POA: Diagnosis present

## 2020-11-24 DIAGNOSIS — I5032 Chronic diastolic (congestive) heart failure: Secondary | ICD-10-CM | POA: Diagnosis present

## 2020-11-24 DIAGNOSIS — D62 Acute posthemorrhagic anemia: Secondary | ICD-10-CM | POA: Diagnosis present

## 2020-11-24 DIAGNOSIS — Z79899 Other long term (current) drug therapy: Secondary | ICD-10-CM

## 2020-11-24 DIAGNOSIS — T45515A Adverse effect of anticoagulants, initial encounter: Secondary | ICD-10-CM | POA: Diagnosis present

## 2020-11-24 DIAGNOSIS — E860 Dehydration: Secondary | ICD-10-CM | POA: Diagnosis present

## 2020-11-24 DIAGNOSIS — Z87891 Personal history of nicotine dependence: Secondary | ICD-10-CM | POA: Diagnosis not present

## 2020-11-24 DIAGNOSIS — M25559 Pain in unspecified hip: Secondary | ICD-10-CM

## 2020-11-24 DIAGNOSIS — R531 Weakness: Secondary | ICD-10-CM | POA: Diagnosis present

## 2020-11-24 DIAGNOSIS — E876 Hypokalemia: Secondary | ICD-10-CM | POA: Diagnosis not present

## 2020-11-24 LAB — DIFFERENTIAL
Abs Immature Granulocytes: 0.03 10*3/uL (ref 0.00–0.07)
Basophils Absolute: 0 10*3/uL (ref 0.0–0.1)
Basophils Relative: 0 %
Eosinophils Absolute: 0.2 10*3/uL (ref 0.0–0.5)
Eosinophils Relative: 2 %
Immature Granulocytes: 0 %
Lymphocytes Relative: 18 %
Lymphs Abs: 1.3 10*3/uL (ref 0.7–4.0)
Monocytes Absolute: 0.4 10*3/uL (ref 0.1–1.0)
Monocytes Relative: 5 %
Neutro Abs: 5.6 10*3/uL (ref 1.7–7.7)
Neutrophils Relative %: 75 %

## 2020-11-24 LAB — URINALYSIS, ROUTINE W REFLEX MICROSCOPIC
Bilirubin Urine: NEGATIVE
Glucose, UA: NEGATIVE mg/dL
Ketones, ur: 5 mg/dL — AB
Leukocytes,Ua: NEGATIVE
Nitrite: NEGATIVE
Protein, ur: 30 mg/dL — AB
Specific Gravity, Urine: 1.025 (ref 1.005–1.030)
pH: 5 (ref 5.0–8.0)

## 2020-11-24 LAB — RAPID URINE DRUG SCREEN, HOSP PERFORMED
Amphetamines: NOT DETECTED
Barbiturates: NOT DETECTED
Benzodiazepines: NOT DETECTED
Cocaine: NOT DETECTED
Opiates: NOT DETECTED
Tetrahydrocannabinol: POSITIVE — AB

## 2020-11-24 LAB — COMPREHENSIVE METABOLIC PANEL
ALT: 109 U/L — ABNORMAL HIGH (ref 0–44)
ALT: 99 U/L — ABNORMAL HIGH (ref 0–44)
AST: 264 U/L — ABNORMAL HIGH (ref 15–41)
AST: 214 U/L — ABNORMAL HIGH (ref 15–41)
Albumin: 2.5 g/dL — ABNORMAL LOW (ref 3.5–5.0)
Albumin: 2.5 g/dL — ABNORMAL LOW (ref 3.5–5.0)
Alkaline Phosphatase: 196 U/L — ABNORMAL HIGH (ref 38–126)
Alkaline Phosphatase: 174 U/L — ABNORMAL HIGH (ref 38–126)
Anion gap: 13 (ref 5–15)
Anion gap: 13 (ref 5–15)
BUN: 8 mg/dL (ref 6–20)
BUN: 6 mg/dL (ref 6–20)
CO2: 17 mmol/L — ABNORMAL LOW (ref 22–32)
CO2: 17 mmol/L — ABNORMAL LOW (ref 22–32)
Calcium: 8.5 mg/dL — ABNORMAL LOW (ref 8.9–10.3)
Calcium: 8.4 mg/dL — ABNORMAL LOW (ref 8.9–10.3)
Chloride: 113 mmol/L — ABNORMAL HIGH (ref 98–111)
Chloride: 113 mmol/L — ABNORMAL HIGH (ref 98–111)
Creatinine, Ser: 1.09 mg/dL — ABNORMAL HIGH (ref 0.44–1.00)
Creatinine, Ser: 1.08 mg/dL — ABNORMAL HIGH (ref 0.44–1.00)
GFR, Estimated: 59 mL/min — ABNORMAL LOW (ref >60)
GFR, Estimated: 60 mL/min — ABNORMAL LOW (ref >60)
Glucose, Bld: 101 mg/dL — ABNORMAL HIGH (ref 70–99)
Glucose, Bld: 77 mg/dL (ref 70–99)
Potassium: 3.5 mmol/L (ref 3.5–5.1)
Potassium: 3 mmol/L — ABNORMAL LOW (ref 3.5–5.1)
Sodium: 143 mmol/L (ref 135–145)
Sodium: 143 mmol/L (ref 135–145)
Total Bilirubin: 1 mg/dL (ref 0.3–1.2)
Total Bilirubin: 0.9 mg/dL (ref 0.3–1.2)
Total Protein: 5.8 g/dL — ABNORMAL LOW (ref 6.5–8.1)
Total Protein: 5.7 g/dL — ABNORMAL LOW (ref 6.5–8.1)

## 2020-11-24 LAB — LIPASE, BLOOD: Lipase: 20 U/L (ref 11–51)

## 2020-11-24 LAB — CBC
HCT: 28 % — ABNORMAL LOW (ref 36.0–46.0)
HCT: 21.8 % — ABNORMAL LOW (ref 36.0–46.0)
Hemoglobin: 9.9 g/dL — ABNORMAL LOW (ref 12.0–15.0)
Hemoglobin: 7.9 g/dL — ABNORMAL LOW (ref 12.0–15.0)
MCH: 42.7 pg — ABNORMAL HIGH (ref 26.0–34.0)
MCH: 43.4 pg — ABNORMAL HIGH (ref 26.0–34.0)
MCHC: 35.4 g/dL (ref 30.0–36.0)
MCHC: 36.2 g/dL — ABNORMAL HIGH (ref 30.0–36.0)
MCV: 120.7 fL — ABNORMAL HIGH (ref 80.0–100.0)
MCV: 119.8 fL — ABNORMAL HIGH (ref 80.0–100.0)
Platelets: 367 10*3/uL (ref 150–400)
Platelets: 283 10*3/uL (ref 150–400)
RBC: 2.32 MIL/uL — ABNORMAL LOW (ref 3.87–5.11)
RBC: 1.82 MIL/uL — ABNORMAL LOW (ref 3.87–5.11)
RDW: 19 % — ABNORMAL HIGH (ref 11.5–15.5)
RDW: 19.3 % — ABNORMAL HIGH (ref 11.5–15.5)
WBC: 7.6 10*3/uL (ref 4.0–10.5)
WBC: 8.2 10*3/uL (ref 4.0–10.5)
nRBC: 0 % (ref 0.0–0.2)
nRBC: 0 % (ref 0.0–0.2)

## 2020-11-24 LAB — MAGNESIUM: Magnesium: 1.6 mg/dL — ABNORMAL LOW (ref 1.7–2.4)

## 2020-11-24 LAB — TROPONIN I (HIGH SENSITIVITY)
Troponin I (High Sensitivity): 11 ng/L (ref <18)
Troponin I (High Sensitivity): 10 ng/L (ref <18)

## 2020-11-24 LAB — VITAMIN B12: Vitamin B-12: 1311 pg/mL — ABNORMAL HIGH (ref 180–914)

## 2020-11-24 LAB — CBG MONITORING, ED: Glucose-Capillary: 102 mg/dL — ABNORMAL HIGH (ref 70–99)

## 2020-11-24 LAB — PROTIME-INR
INR: 8.2 (ref 0.8–1.2)
Prothrombin Time: 66 seconds — ABNORMAL HIGH (ref 11.4–15.2)

## 2020-11-24 LAB — APTT: aPTT: 49 seconds — ABNORMAL HIGH (ref 24–36)

## 2020-11-24 LAB — PHOSPHORUS: Phosphorus: 2.8 mg/dL (ref 2.5–4.6)

## 2020-11-24 LAB — ETHANOL: Alcohol, Ethyl (B): <10 mg/dL (ref <10)

## 2020-11-24 MED ORDER — SODIUM CHLORIDE 0.9% FLUSH
10.0000 mL | Freq: Two times a day (BID) | INTRAVENOUS | Status: DC
  Administered 2020-11-25 – 2020-11-27 (×6): 10 mL
  Administered 2020-11-28: 30 mL
  Administered 2020-11-28: 10 mL
  Administered 2020-11-29: 30 mL
  Administered 2020-11-29: 20 mL
  Administered 2020-11-30 – 2020-12-03 (×6): 10 mL

## 2020-11-24 MED ORDER — GABAPENTIN 300 MG PO CAPS: 300.0000 mg | ORAL_CAPSULE | Freq: Every day | ORAL | Status: DC

## 2020-11-24 MED ORDER — VITAMIN B12 500 MCG PO TABS: 500.0000 ug | ORAL_TABLET | Freq: Every day | ORAL | Status: DC

## 2020-11-24 MED ORDER — GABAPENTIN 100 MG PO CAPS
100.0000 mg | ORAL_CAPSULE | Freq: Every day | ORAL | Status: DC
  Administered 2020-11-25 – 2020-12-03 (×8): 100 mg via ORAL
  Filled 2020-11-24 (×8): qty 1

## 2020-11-24 MED ORDER — HYDROMORPHONE HCL 1 MG/ML IJ SOLN
1.0000 mg | INTRAMUSCULAR | Status: AC | PRN
Start: 2020-11-24 — End: 2020-11-27
  Administered 2020-11-26 – 2020-11-27 (×3): 1 mg via INTRAVENOUS
  Filled 2020-11-24 (×3): qty 1

## 2020-11-24 MED ORDER — METOCLOPRAMIDE HCL 10 MG PO TABS
10.0000 mg | ORAL_TABLET | Freq: Three times a day (TID) | ORAL | Status: DC
  Administered 2020-11-25 – 2020-11-26 (×5): 10 mg via ORAL
  Filled 2020-11-24 (×5): qty 1

## 2020-11-24 MED ORDER — PANTOPRAZOLE SODIUM 40 MG PO TBEC
40.0000 mg | DELAYED_RELEASE_TABLET | Freq: Every day | ORAL | Status: DC
  Administered 2020-11-24 – 2020-11-25 (×2): 40 mg via ORAL
  Filled 2020-11-24 (×2): qty 1

## 2020-11-24 MED ORDER — SODIUM CHLORIDE 0.9% FLUSH
10.0000 mL | INTRAVENOUS | Status: DC | PRN
  Administered 2020-12-01: 10 mL

## 2020-11-24 MED ORDER — SODIUM CHLORIDE 0.9 % IV BOLUS
1000.0000 mL | Freq: Once | INTRAVENOUS | Status: AC
  Administered 2020-11-24: 1000 mL via INTRAVENOUS

## 2020-11-24 MED ORDER — VITAMIN B-12 1000 MCG PO TABS
500.0000 ug | ORAL_TABLET | Freq: Every day | ORAL | Status: DC
  Administered 2020-11-25 – 2020-12-03 (×8): 500 ug via ORAL
  Filled 2020-11-24 (×9): qty 1

## 2020-11-24 MED ORDER — THIAMINE HCL 100 MG/ML IJ SOLN
Freq: Once | INTRAVENOUS | Status: DC
  Filled 2020-11-24: qty 1000

## 2020-11-24 MED ORDER — LORAZEPAM 1 MG PO TABS: 1.0000 mg | ORAL_TABLET | ORAL | Status: AC | PRN

## 2020-11-24 MED ORDER — LORAZEPAM 2 MG/ML IJ SOLN: 1.0000 mg | INTRAMUSCULAR | Status: AC | PRN

## 2020-11-24 MED ORDER — ACETAMINOPHEN 500 MG PO TABS
500.0000 mg | ORAL_TABLET | Freq: Four times a day (QID) | ORAL | Status: DC | PRN
  Administered 2020-11-24 – 2020-11-30 (×9): 500 mg via ORAL
  Filled 2020-11-24 (×9): qty 1

## 2020-11-24 MED ORDER — THIAMINE HCL 100 MG/ML IJ SOLN
Freq: Once | INTRAVENOUS | Status: AC
  Filled 2020-11-24 (×2): qty 1000

## 2020-11-24 MED ORDER — THIAMINE HCL 100 MG PO TABS
100.0000 mg | ORAL_TABLET | Freq: Every day | ORAL | Status: DC
Start: 2020-11-24 — End: 2020-12-03
  Administered 2020-11-24 – 2020-12-03 (×9): 100 mg via ORAL
  Filled 2020-11-24 (×9): qty 1

## 2020-11-24 MED ORDER — PHYTONADIONE 5 MG PO TABS
2.5000 mg | ORAL_TABLET | Freq: Once | ORAL | Status: AC
  Administered 2020-11-25: 2.5 mg via ORAL
  Filled 2020-11-24 (×2): qty 1

## 2020-11-24 MED ORDER — GABAPENTIN 400 MG PO CAPS
400.0000 mg | ORAL_CAPSULE | Freq: Every day | ORAL | Status: DC
  Administered 2020-11-24 – 2020-12-02 (×9): 400 mg via ORAL
  Filled 2020-11-24 (×10): qty 1

## 2020-11-24 MED ORDER — PROMETHAZINE HCL 25 MG/ML IJ SOLN
12.5000 mg | Freq: Four times a day (QID) | INTRAMUSCULAR | Status: AC | PRN
  Filled 2020-11-24: qty 1

## 2020-11-24 MED ORDER — ADULT MULTIVITAMIN W/MINERALS CH
1.0000 | ORAL_TABLET | Freq: Every day | ORAL | Status: DC
  Administered 2020-11-25 – 2020-12-03 (×8): 1 via ORAL
  Filled 2020-11-24 (×8): qty 1

## 2020-11-24 MED ORDER — VITAMIN K1 10 MG/ML IJ SOLN
1.0000 mg | Freq: Once | INTRAVENOUS | Status: DC
  Filled 2020-11-24: qty 0.1

## 2020-11-24 MED ORDER — FOLIC ACID 1 MG PO TABS
1.0000 mg | ORAL_TABLET | Freq: Every day | ORAL | Status: DC
Start: 2020-11-24 — End: 2020-12-03
  Administered 2020-11-24 – 2020-12-03 (×9): 1 mg via ORAL
  Filled 2020-11-24 (×9): qty 1

## 2020-11-24 MED ORDER — GABAPENTIN 100 MG PO CAPS: 100.0000 mg | ORAL_CAPSULE | Freq: Two times a day (BID) | ORAL | Status: DC

## 2020-11-24 NOTE — ED Notes (Signed)
IV team to bedside. 

## 2020-11-24 NOTE — ED Notes (Signed)
Pt reports not taking Vitamin B1 at home

## 2020-11-24 NOTE — ED Notes (Signed)
IV access attempted 2 without success.

## 2020-11-24 NOTE — ED Provider Notes (Signed)
[facility] EMERGENCY DEPARTMENT Provider Note   CSN: [id number] Arrival date & time: 11/24/20  1122     History Chief Complaint  Patient presents with  . Weakness    Mary [name] is a 60 y.o. 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  On my examination, patient reports that for the past 3 or 4 days she has been feeling weak and fatigued.  She states that her chronic back and lower extremity pain has been unimproved with her regular gabapentin.  She states that she has been taking her 300 mg gabapentin that she is supposed to only take at night during the day.  She states that that has made her particularly drowsy and she is essentially sleeping all day.  She states that over the course of the past few days, she has had multiple episodes of nausea and emesis.  This morning her episode of emesis had bright red blood.  She states that she is not eating and drinking well and has also had multiple episodes of watery stools.  She lives alone at home, but has a caregiver come twice a week.  This morning she was walking to the kitchen when she suddenly developed lightheadedness, nausea, shortness of breath, and felt as though she was about to pass out.  She sat down and then called 911.  She states that her symptoms did not particularly improve with rest.  EMS commented that she had dysarthria and weakness on the left side, however patient states that is chronic and unchanged.  She denies any fevers or chills, recent illness or infection, abdominal pain, urinary symptoms, headache or persistent dizziness, new numbness or weakness, or any other symptoms.  Patient denies alcohol or illicit drug use.    HPI      Past Medical History:  Diagnosis Date  . Abnormal liver function   . Acute diastolic heart failure ([place]) 03/15/2013  . Alcoholism ([place])   . Anemia   . B12 deficiency   . Carotid stenosis    Carotid US 2/17: bilateral ICA 1-39% >> FU prn  . Childhood asthma   . Chronic diastolic CHF (congestive heart failure) ([place])   . Complete heart block ([place])   . Epigastric hernia 200's  . GERD (gastroesophageal reflux disease)   . Heart murmur   . History of blood transfusion    "14 w/1st pregnancy; 2 w/last C-section" (03/15/2013)  . Hx of echocardiogram    a. Echo 4/16:  Mild focal basal septal hypertrophy, EF 60-65%, no RWMA, Mechanical MVR ok with mild central regurgitation and no perivalvular leak, small mobile density attached to valvular ring (1.5x1 cm) - post surgical changes vs SBE, mild LAE;   b. Echo 2/17:  Mild post wall LVH, EF 55-60%, no RWMA, mechanical MVR ok (mean 5 mmHg), normal RVSF  . Hypertension    no pcp   will go to [facility]  . Macrocytic anemia   . Protein calorie malnutrition ([place])   . 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 S/P mitral valve replacement with metallic valve 1/32/4401   9mm Sorin Carbomedics mechanical prosthesis via right mini thoracotomy approach  . 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   Complex valvuloplasty including Cor-matrix ECM  patch augmentation of anterior and lateral leaflets with 56mm Edwards mc3 ring annuloplasty via right mini-thoracotomy approach  . Severe mitral regurgitation 04/22/2013  . Sinus tachycardia   . SVT (supraventricular tachycardia) ([place]) 03/16/2013  . TIA (transient ischemic attack)   . Tricuspid regurgitation     Patient Active Problem List   Diagnosis Date Noted  . Alcoholic peripheral neuropathy ([place]) 09/01/2020  . Thiamin deficiency 09/01/2020  . Paresthesia   . Vitamin B12 deficiency 05/04/2020  . GERD (gastroesophageal reflux disease)   . Marijuana abuse   . Macrocytic anemia 04/04/2019  .  Intermittent complete heart block ([place])   . Syncope 03/28/2019  . Orthostatic hypotension 01/11/2017  . Elevated liver enzymes 01/11/2017  . Atypical chest pain 06/05/2016  . TIA (transient ischemic attack) 09/13/2015  . CVA (cerebral vascular accident) ([place]) 09/12/2015  . SOB (shortness of breath) 02/24/2014  . First degree heart block 06/17/2013  . (HFpEF) heart failure with preserved ejection fraction ([place]) 06/16/2013  . History of bacterial endocarditis 06/16/2013  . Warfarin anticoagulation 06/10/2013  . S/P mitral valve replacement with metallic valve 36/14/4315  . Femoral hernia 05/23/2013  . Mitral valve insufficiency 04/22/2013  . Tricuspid regurgitation 04/22/2013  . 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 SVT (supraventricular tachycardia) ([place]) 03/16/2013    Past Surgical History:  Procedure Laterality Date  . APPENDECTOMY  [id number]  . BIOPSY  05/01/2020   Procedure: BIOPSY;  Surgeon: Milus Banister, MD;  Location: [facility];  Service: Endoscopy;;  . CARDIAC CATHETERIZATION    . CARDIAC VALVE REPLACEMENT    . CESAREAN SECTION  1983  . CESAREAN SECTION WITH BILATERAL TUBAL LIGATION  1999  . COLONOSCOPY WITH PROPOFOL N/A 05/01/2020   Procedure: COLONOSCOPY WITH PROPOFOL;  Surgeon: Milus Banister, MD;  [facility];  Service: Endoscopy;  Laterality: N/A;  . ESOPHAGOGASTRODUODENOSCOPY (EGD) WITH PROPOFOL N/A 05/01/2020   Procedure: ESOPHAGOGASTRODUODENOSCOPY (EGD) WITH PROPOFOL;  Surgeon: Milus Banister, MD;  Location: [facility] ENDOSCOPY;  Service: Endoscopy;  Laterality: N/A;  . FEMORAL HERNIA REPAIR Right 05/23/2013   Procedure: HERNIA REPAIR FEMORAL;  Surgeon: Rexene Alberts, MD;  Location: [facility];  Service: Open Heart Surgery;  Laterality: Right;  . INTRAOPERATIVE TRANSESOPHAGEAL ECHOCARDIOGRAM N/A 05/23/2013   Procedure: INTRAOPERATIVE TRANSESOPHAGEAL ECHOCARDIOGRAM;  Surgeon: Rexene Alberts, MD;  Location: [facility];  Service: Open Heart Surgery;   Laterality: N/A;  . LAPAROSCOPIC CHOLECYSTECTOMY  2003  . LEFT AND RIGHT HEART CATHETERIZATION WITH CORONARY ANGIOGRAM N/A 03/22/2013   Procedure: LEFT AND RIGHT HEART CATHETERIZATION WITH CORONARY ANGIOGRAM;  Surgeon: Burnell Blanks, MD;  Location: [facility] CATH LAB;  Service: Cardiovascular;  Laterality: N/A;  . MINIMALLY INVASIVE TRICUSPID VALVE REPAIR Right 05/23/2013   Procedure: MINIMALLY INVASIVE TRICUSPID VALVE REPAIR;  Surgeon: Rexene Alberts, MD;  Location: [facility];  Service: Open Heart Surgery;  Laterality: Right;  . MITRAL VALVE REPLACEMENT N/A 05/23/2013   Procedure: MITRAL VALVE (MV) REPLACEMENT;  Surgeon: Rexene Alberts, MD;  Location: [facility];  Service: Open Heart Surgery;  Laterality: N/A;  . MULTIPLE EXTRACTIONS WITH ALVEOLOPLASTY N/A 04/04/2013   Procedure: Extraction of tooth #'s 1,8,9,13,14,15,23,24,25,26 with alveoloplasty and gross debridement of remaining teeth;  Surgeon: Lenn Cal, DDS;  Location: [facility];  Service: Oral Surgery;  Laterality: N/A;  . TEE WITHOUT CARDIOVERSION N/A 03/18/2013   Procedure: TRANSESOPHAGEAL ECHOCARDIOGRAM (TEE);  Surgeon: Larey Dresser, MD;  Location: [facility];  Service: Cardiovascular;  Laterality: N/A;  . TEE WITHOUT CARDIOVERSION N/A 06/17/2013  Procedure: TRANSESOPHAGEAL ECHOCARDIOGRAM (TEE);  Surgeon: Lelon Perla, MD;  Location: [facility] ENDOSCOPY;  Service: Cardiovascular;  Laterality: N/A;  . TUBAL LIGATION  1999     OB History   No obstetric history on file.     Family History  Problem Relation Age of Onset  . Stroke Father   . Cancer Father   . Sarcoidosis Sister   . Heart attack Neg Hx   . Colon cancer Neg Hx   . Stomach cancer Neg Hx   . Pancreatic cancer Neg Hx   . Esophageal cancer Neg Hx     Social History   Tobacco Use  . Smoking status: Former Smoker    Packs/day: 0.50    Years: 36.00    Pack years: 18.00    Types: Cigarettes    Quit date: 03/15/2013    Years since quitting: 7.7  . Smokeless  tobacco: Never Used  Vaping Use  . Vaping Use: Never used  Substance Use Topics  . Alcohol use: Yes    Comment:  "glass of wine"  . Drug use: No    Home Medications Prior to Admission medications   Medication Sig Start Date End Date Taking? Authorizing Provider  acetaminophen (TYLENOL) 500 MG tablet Take 1 tablet (500 mg total) by mouth every 6 (six) hours as needed for mild pain or headache. 08/24/20   Domenic Polite, MD  Cyanocobalamin (VITAMIN B-12) 500 MCG TABS Take 500 mcg by mouth daily. 08/25/20   Domenic Polite, MD  folic acid (FOLVITE) 1 MG tablet Take 1 tablet (1 mg total) by mouth daily. 10/06/20 01/04/21  [name], MD  furosemide (LASIX) 40 MG tablet Take 0.5 tablets (20 mg total) by mouth daily. 08/28/20 11/26/20  Dunn, Nedra Hai, PA-C  gabapentin (NEURONTIN) 100 MG capsule Take 1 capsule (100 mg total) by mouth 2 (two) times daily. And continue 300 mg at bedtime 08/27/20   Mayers, Cari S, PA-C  gabapentin (NEURONTIN) 300 MG capsule Take 1 capsule (300 mg total) by mouth at bedtime. 10/06/20   [name], MD  pantoprazole (PROTONIX) 40 MG tablet Take 1 tablet (40 mg total) by mouth daily. 08/27/20 11/25/20  Mayers, Cari S, PA-C  thiamine 100 MG tablet Take 1 tablet (100 mg total) by mouth daily. 08/24/20   Domenic Polite, MD  warfarin (COUMADIN) 5 MG tablet Take 2.5mg  to 5mg  daily or as directed by Coumadin clinic 11/17/20   Evans Lance, MD    Allergies    Oxycodone and Tape  Review of Systems   Review of Systems  All other systems reviewed and are negative.   Physical Exam Updated Vital Signs BP 127/76   Pulse 93   Temp 97.8 F (36.6 C) (Oral)   Resp 18   Ht 5\' 2"  (1.575 m)   Wt 54.9 kg   LMP 03/22/2013   SpO2 100%   BMI 22.13 kg/m   Physical Exam Vitals and nursing note reviewed. Exam conducted with a chaperone present.  Constitutional:      General: She is not in acute distress.    Appearance: She is ill-appearing. She is not  toxic-appearing.     Comments: Disheveled, ill-appearing.  Malodorous.  HENT:     Head: Normocephalic and atraumatic.     Mouth/Throat:     Mouth: Mucous membranes are dry.  Eyes:     General: No scleral icterus.    Extraocular Movements: Extraocular movements intact.     Conjunctiva/sclera: Conjunctivae normal.  Pupils: Pupils are equal, round, and reactive to light.  Cardiovascular:     Rate and Rhythm: Regular rhythm. Tachycardia present.     Pulses: Normal pulses.  Pulmonary:     Effort: Pulmonary effort is normal. No respiratory distress.  Abdominal:     General: Abdomen is flat. There is no distension.     Palpations: Abdomen is soft.     Tenderness: There is no abdominal tenderness. There is no guarding.     Comments: Soft, nondistended.  No focal areas of tenderness.  No guarding.  No masses.  Negative Murphy sign.  Musculoskeletal:     Cervical back: Normal range of motion. No rigidity.     Comments: Moves all extremities, albeit with generalized weakness.  Skin:    General: Skin is dry.  Neurological:     General: No focal deficit present.     Mental Status: She is alert and oriented to person, place, and time.     GCS: GCS eye subscore is 4. GCS verbal subscore is 5. GCS motor subscore is 6.  Psychiatric:        Behavior: Behavior normal.        Thought Content: Thought content normal.     Comments: Tearful.     ED Results / Procedures / Treatments   Labs (all labs ordered are listed, but only abnormal results are displayed) Labs Reviewed  PROTIME-INR - Abnormal; Notable for the following components:      Result Value   Prothrombin Time 66.0 (*)    INR 8.2 (*)    All other components within normal limits  APTT - Abnormal; Notable for the following components:   aPTT 49 (*)    All other components within normal limits  CBC - Abnormal; Notable for the following components:   RBC 2.32 (*)    Hemoglobin 9.9 (*)    HCT 28.0 (*)    MCV 120.7 (*)    MCH  42.7 (*)    RDW 19.0 (*)    All other components within normal limits  COMPREHENSIVE METABOLIC PANEL - Abnormal; Notable for the following components:   Chloride 113 (*)    CO2 17 (*)    Glucose, Bld 101 (*)    Creatinine, Ser 1.09 (*)    Calcium 8.5 (*)    Total Protein 5.8 (*)    Albumin 2.5 (*)    AST 264 (*)    ALT 109 (*)    Alkaline Phosphatase 196 (*)    GFR, Estimated 59 (*)    All other components within normal limits  CBG MONITORING, ED - Abnormal; Notable for the following components:   Glucose-Capillary 102 (*)    All other components within normal limits  DIFFERENTIAL  ETHANOL  RAPID URINE DRUG SCREEN, HOSP PERFORMED  URINALYSIS, ROUTINE W REFLEX MICROSCOPIC  LIPASE, BLOOD  VITAMIN B1  VITAMIN B12  CBG MONITORING, ED  TROPONIN I (HIGH SENSITIVITY)  TROPONIN I (HIGH SENSITIVITY)    EKG EKG Interpretation  Date/Time:  Tuesday November 24 2020 11:28:03 EST Ventricular Rate:  119 PR Interval:  154 QRS Duration: 70 QT Interval:  338 QTC Calculation: 475 R Axis:   10 Text Interpretation: Sinus tachycardia with occasional Premature ventricular complexes Possible Anterior infarct , age undetermined Abnormal ECG Confirmed by Lennice Sites [phone]) on 11/24/2020 11:54:04 AM   Radiology CT HEAD WO CONTRAST  Result Date: 11/24/2020 CLINICAL DATA:  Mental status change. EXAM: CT HEAD WITHOUT CONTRAST TECHNIQUE: Contiguous axial images were  obtained from the base of the skull through the vertex without intravenous contrast. COMPARISON:  08/14/2020 FINDINGS: Brain: Mild generalized atrophy. Negative for hydrocephalus. Negative for acute infarct, hemorrhage, mass. Vascular: Negative for hyperdense vessel Skull: Negative Sinuses/Orbits: Visualized paranasal sinuses clear. Orbits not included on the study Other: None IMPRESSION: Generalized atrophy.  No acute abnormality and no interval change. Electronically Signed   By: Franchot Gallo M.D.   On: 11/24/2020 13:40   DG Chest  Portable 1 View  Result Date: 11/24/2020 CLINICAL DATA:  Weakness with anterior chest wall pain. EXAM: PORTABLE CHEST 1 VIEW COMPARISON:  05/04/2019 FINDINGS: 1415 hours. The lungs are clear without focal pneumonia, edema, pneumothorax or pleural effusion. The cardiopericardial silhouette is within normal limits for size. Status post cardiac valve replacement. Telemetry leads overlie the chest. IMPRESSION: No active disease. Electronically Signed   By: Misty Stanley M.D.   On: 11/24/2020 14:53    Procedures .Critical Care Performed by: Corena Herter, PA-C Authorized by: Corena Herter, PA-C   Critical care provider statement:    Critical care time (minutes):  45   Critical care was necessary to treat or prevent imminent or life-threatening deterioration of the following conditions:  Cardiac failure   Critical care was time spent personally by me on the following activities:  Discussions with consultants, evaluation of patient's response to treatment, examination of patient, ordering and performing treatments and interventions, ordering and review of laboratory studies, ordering and review of radiographic studies, pulse oximetry, re-evaluation of patient's condition, obtaining history from patient or surrogate and review of old charts Comments:     INR >8.       Medications Ordered in ED Medications  sodium chloride 0.9 % bolus 1,000 mL (has no administration in time range)  phytonadione (VITAMIN K) 1 mg in dextrose 5 % 50 mL IVPB (has no administration in time range)  sodium chloride 0.9 % 1,000 mL with thiamine 643 mg, folic acid 1 mg, multivitamins adult 10 mL infusion (has no administration in time range)    ED Course  I have reviewed the triage vital signs and the nursing notes.  Pertinent labs & imaging results that were available during my care of the patient were reviewed by me and considered in my medical decision making (see chart for details).  Clinical Course as of  11/24/20 1513  Tue Nov 24, 2020  1512 I spoke with Dr. Roosevelt Locks who will see and admit patient. [GG]    Clinical Course User Index [GG] Corena Herter, PA-C   MDM Rules/Calculators/A&P                          Mary [name] was evaluated in Emergency Department on 11/24/2020 for the symptoms described in the history of present illness. She was evaluated in the context of the global COVID-19 pandemic, which necessitated consideration that the patient might be at risk for infection with the SARS-CoV-2 virus that causes COVID-19. Institutional protocols and algorithms that pertain to the evaluation of patients at risk for COVID-19 are in a state of rapid change based on information released by regulatory bodies including the CDC and federal and state organizations. These policies and algorithms were followed during the patient's care in the ED.  I personally reviewed patient's medical chart and all notes from triage and staff during today's encounter. I have also ordered and reviewed all labs and imaging that I felt to be medically necessary in the  evaluation of this patient's complaints and with consideration of their physical exam. If needed, translation services were available and utilized.   Patient in the ED for weakness near syncopal episode at home.  Patient states that she has not been eating and drinking well the past few days and has not been taking her medications, as prescribed.  She has been increasing her gabapentin dosing at home due to her chronic pain being uncontrolled by current regimen.  She lives alone, but states that she has a Marine scientist who comes about twice weekly.  On my exam she is malodorous and chronically ill-appearing.  She states that she has not been eating or drinking well and also has been having episodes of loose stools and N/V.    CT head is reviewed and without any acute intracranial normalities.  She is an unreliable historian and admits that she has not been taking her  medications as directed.  Her INR is critically therapeutic at 8.2.  We will give 1 mg IV vitamin K given her reported blood in emesis this morning in conjunction with her elevated INR to 8.2.    Patient has a history of B12 and thiamine deficiencies will add B12 and thiamine levels.  Would not be surprised if she was not taking her supplements.  While she denies alcohol use, she has also had a significant increase in her liver enzymes when compared to labs obtained a few months ago.  AST greater than 2:1 ratio.  She is dehydrated with elevated HR to 120s and soft BP 99/73.  Will treat with 1 L IV NS and order a banana bag.  IV team has had to be consulted.  Will admit to medicine for continued management.    I spoke with Dr. Roosevelt Locks who will see and admit patient.   Final Clinical Impression(s) / ED Diagnoses Final diagnoses:  Weakness  Elevated INR    Rx / DC Orders ED Discharge Orders    None       Corena Herter, PA-C 11/24/20 1513    Lennice Sites, DO 11/24/20 1535

## 2020-11-24 NOTE — H&P (Signed)
History and Physical    Mary [name] DOB: 04/14/1961 DOA: 11/24/2020  PCP: [name], MD (Confirm with patient/family/NH records and if not entered, this has to be entered at [facility] point of entry) Patient coming from: Home  I have personally briefly reviewed patient's old medical records in [place]  Chief Complaint: N/V and diarrhea  HPI: Mary [name] is a 60 y.o. female with medical history significant of mechanical mitral and tricuspid valve replacement 2014 on Coumadin, history of alcohol abuse, chronic diastolic CHF, pyloric stenosis, marijuana use, presented with repeated nausea vomiting and diarrhea.  Patient started to feel frequent nausea and then developed vomiting and diarrhea over the last 3 to 4 days.  She reported every time she want to eat she feels nauseous and then vomited out stomach content, but for 2 times last night this morning, she vomited of bright red blood.  No abdominal pain, and she also has had a loose bowel movement for 5-day, no blood or mucus and responded to over-the-counter Imodium.  She admitted to been using marijuana.  And she has been using more gabapentin last night of 300 mg instead of 100 mg.   ED Course: Negative CT head, blood work showed elevated LFTs, AST 264 ALT 109, INR 8.2.  Review of Systems: As per HPI otherwise 14 point review of systems negative.    Past Medical History:  Diagnosis Date  . Abnormal liver function   . Acute diastolic heart failure ([place]) 03/15/2013  . Alcoholism ([place])   . Anemia   . B12 deficiency   . Carotid stenosis    Carotid US 2/17: bilateral ICA 1-39% >> FU prn  . Childhood asthma   . Chronic diastolic CHF (congestive heart failure) ([place])   . Complete heart block ([place])   . Epigastric hernia 200's  . GERD (gastroesophageal reflux disease)   . Heart murmur   . History of blood transfusion    "14 w/1st pregnancy; 2 w/last C-section" (03/15/2013)  . Hx of echocardiogram    a.  Echo 4/16:  Mild focal basal septal hypertrophy, EF 60-65%, no RWMA, Mechanical MVR ok with mild central regurgitation and no perivalvular leak, small mobile density attached to valvular ring (1.5x1 cm) - post surgical changes vs SBE, mild LAE;   b. Echo 2/17:  Mild post wall LVH, EF 55-60%, no RWMA, mechanical MVR ok (mean 5 mmHg), normal RVSF  . Hypertension    no pcp   will go to [facility]  . Macrocytic anemia   . Protein calorie malnutrition ([place])   . 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 S/P mitral valve replacement with metallic valve 4/31/5400   9mm Sorin Carbomedics mechanical prosthesis via right mini thoracotomy approach  . 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   Complex valvuloplasty including Cor-matrix ECM patch augmentation of anterior and lateral leaflets with 76mm Edwards mc3 ring annuloplasty via right mini-thoracotomy approach  . Severe mitral regurgitation 04/22/2013  . Sinus tachycardia   . SVT (supraventricular tachycardia) ([place]) 03/16/2013  . TIA (transient ischemic attack)   . Tricuspid regurgitation     Past Surgical History:  Procedure Laterality Date  . APPENDECTOMY  [id number]  . BIOPSY  05/01/2020   Procedure: BIOPSY;  Surgeon: Milus Banister, MD;  Location: [place];  Service: Endoscopy;;  . CARDIAC CATHETERIZATION    . CARDIAC VALVE REPLACEMENT    . CESAREAN SECTION  1983  . CESAREAN SECTION WITH BILATERAL TUBAL LIGATION  1999  .  COLONOSCOPY WITH PROPOFOL N/A 05/01/2020   Procedure: COLONOSCOPY WITH PROPOFOL;  Surgeon: Milus Banister, MD;  Location: [facility] ENDOSCOPY;  Service: Endoscopy;  Laterality: N/A;  . ESOPHAGOGASTRODUODENOSCOPY (EGD) WITH PROPOFOL N/A 05/01/2020   Procedure: ESOPHAGOGASTRODUODENOSCOPY (EGD) WITH PROPOFOL;  Surgeon: Milus Banister, MD;  Location: [facility] ENDOSCOPY;  Service: Endoscopy;  Laterality: N/A;  . FEMORAL HERNIA REPAIR Right 05/23/2013   Procedure: HERNIA REPAIR FEMORAL;  Surgeon: Rexene Alberts, MD;  Location: [place];   Service: Open Heart Surgery;  Laterality: Right;  . INTRAOPERATIVE TRANSESOPHAGEAL ECHOCARDIOGRAM N/A 05/23/2013   Procedure: INTRAOPERATIVE TRANSESOPHAGEAL ECHOCARDIOGRAM;  Surgeon: Rexene Alberts, MD;  Location: [place];  Service: Open Heart Surgery;  Laterality: N/A;  . LAPAROSCOPIC CHOLECYSTECTOMY  2003  . LEFT AND RIGHT HEART CATHETERIZATION WITH CORONARY ANGIOGRAM N/A 03/22/2013   Procedure: LEFT AND RIGHT HEART CATHETERIZATION WITH CORONARY ANGIOGRAM;  Surgeon: Burnell Blanks, MD;  Location: [facility] CATH LAB;  Service: Cardiovascular;  Laterality: N/A;  . MINIMALLY INVASIVE TRICUSPID VALVE REPAIR Right 05/23/2013   Procedure: MINIMALLY INVASIVE TRICUSPID VALVE REPAIR;  Surgeon: Rexene Alberts, MD;  Location: [place];  Service: Open Heart Surgery;  Laterality: Right;  . MITRAL VALVE REPLACEMENT N/A 05/23/2013   Procedure: MITRAL VALVE (MV) REPLACEMENT;  Surgeon: Rexene Alberts, MD;  Location: [place];  Service: Open Heart Surgery;  Laterality: N/A;  . MULTIPLE EXTRACTIONS WITH ALVEOLOPLASTY N/A 04/04/2013   Procedure: Extraction of tooth #'s 1,8,9,13,14,15,23,24,25,26 with alveoloplasty and gross debridement of remaining teeth;  Surgeon: Lenn Cal, DDS;  Location: [place];  Service: Oral Surgery;  Laterality: N/A;  . TEE WITHOUT CARDIOVERSION N/A 03/18/2013   Procedure: TRANSESOPHAGEAL ECHOCARDIOGRAM (TEE);  Surgeon: Larey Dresser, MD;  Location: [place];  Service: Cardiovascular;  Laterality: N/A;  . TEE WITHOUT CARDIOVERSION N/A 06/17/2013   Procedure: TRANSESOPHAGEAL ECHOCARDIOGRAM (TEE);  Surgeon: Lelon Perla, MD;  Location: [facility] ENDOSCOPY;  Service: Cardiovascular;  Laterality: N/A;  . [place]     reports that she quit smoking about 7 years ago. Her smoking use included cigarettes. She has a 18.00 pack-year smoking history. She has never used smokeless tobacco. She reports current alcohol use. She reports that she does not use drugs.  Allergies  Allergen  Reactions  . Oxycodone Itching  . Tape Rash and Other (See Comments)    THE ONLY TAPE THAT IS TOLERATED IS PAPER TAPE. EKG leads inflame the skin    Family History  Problem Relation Age of Onset  . Stroke Father   . Cancer Father   . Sarcoidosis Sister   . Heart attack Neg Hx   . Colon cancer Neg Hx   . Stomach cancer Neg Hx   . Pancreatic cancer Neg Hx   . Esophageal cancer Neg Hx      Prior to Admission medications   Medication Sig Start Date End Date Taking? Authorizing Provider  acetaminophen (TYLENOL) 500 MG tablet Take 1 tablet (500 mg total) by mouth every 6 (six) hours as needed for mild pain or headache. 08/24/20   Domenic Polite, MD  Cyanocobalamin (VITAMIN B-12) 500 MCG TABS Take 500 mcg by mouth daily. 08/25/20   Domenic Polite, MD  folic acid (FOLVITE) 1 MG tablet Take 1 tablet (1 mg total) by mouth daily. 10/06/20 01/04/21  [name], MD  furosemide (LASIX) 40 MG tablet Take 0.5 tablets (20 mg total) by mouth daily. 08/28/20 11/26/20  Dunn, Nedra Hai, PA-C  gabapentin (NEURONTIN) 100 MG capsule Take  1 capsule (100 mg total) by mouth 2 (two) times daily. And continue 300 mg at bedtime 08/27/20   Mayers, Cari S, PA-C  gabapentin (NEURONTIN) 300 MG capsule Take 1 capsule (300 mg total) by mouth at bedtime. 10/06/20   [name], MD  pantoprazole (PROTONIX) 40 MG tablet Take 1 tablet (40 mg total) by mouth daily. 08/27/20 11/25/20  Mayers, Cari S, PA-C  thiamine 100 MG tablet Take 1 tablet (100 mg total) by mouth daily. 08/24/20   Domenic Polite, MD  warfarin (COUMADIN) 5 MG tablet Take 2.5mg  to 5mg  daily or as directed by Coumadin clinic 11/17/20   Evans Lance, MD    Physical Exam: Vitals:   11/24/20 1530 11/24/20 1535 11/24/20 1545 11/24/20 1630  BP: (!) 88/73 (!) 110/99  105/74  Pulse:  (!) 112 (!) 124 (!) 108  Resp: 19 20 20 20   Temp:      TempSrc:      SpO2:  100% 100% 100%  Weight:      Height:        Constitutional: NAD, calm,  comfortable Vitals:   11/24/20 1530 11/24/20 1535 11/24/20 1545 11/24/20 1630  BP: (!) 88/73 (!) 110/99  105/74  Pulse:  (!) 112 (!) 124 (!) 108  Resp: 19 20 20 20   Temp:      TempSrc:      SpO2:  100% 100% 100%  Weight:      Height:       Eyes: PERRL, lids and conjunctivae normal ENMT: Mucous membranes are dry. Posterior pharynx clear of any exudate or lesions.Normal dentition.  Neck: normal, supple, no masses, no thyromegaly Respiratory: clear to auscultation bilaterally, no wheezing, no crackles. Normal respiratory effort. No accessory muscle use.  Cardiovascular: Regular rate and rhythm, no murmurs / rubs / gallops. No extremity edema. 2+ pedal pulses. No carotid bruits.  Abdomen: no tenderness, no masses palpated. No hepatosplenomegaly. Bowel sounds positive.  Musculoskeletal: no clubbing / cyanosis. No joint deformity upper and lower extremities. Good ROM, no contractures. Normal muscle tone.  Skin: no rashes, lesions, ulcers. No induration Neurologic: CN 2-12 grossly intact. Sensation intact, DTR normal. Strength 5/5 in all 4.  Psychiatric: Normal judgment and insight. Alert and oriented x 3. Normal mood.     Labs on Admission: I have personally reviewed following labs and imaging studies  CBC: Recent Labs  Lab 11/24/20 1145  WBC 7.6  NEUTROABS 5.6  HGB 9.9*  HCT 28.0*  MCV 120.7*  PLT 578   Basic Metabolic Panel: Recent Labs  Lab 11/24/20 1145  NA 143  K 3.5  CL 113*  CO2 17*  GLUCOSE 101*  BUN 8  CREATININE 1.09*  CALCIUM 8.5*   GFR: Estimated Creatinine Clearance: 44.5 mL/min (A) (by C-G formula based on SCr of 1.09 mg/dL (H)). Liver Function Tests: Recent Labs  Lab 11/24/20 1145  AST 264*  ALT 109*  ALKPHOS 196*  BILITOT 1.0  PROT 5.8*  ALBUMIN 2.5*   Recent Labs  Lab 11/24/20 1331  LIPASE 20   No results for input(s): AMMONIA in the last 168 hours. Coagulation Profile: Recent Labs  Lab 11/24/20 1145  INR 8.2*   Cardiac  Enzymes: No results for input(s): CKTOTAL, CKMB, CKMBINDEX, TROPONINI in the last 168 hours. BNP (last 3 results) No results for input(s): PROBNP in the last 8760 hours. HbA1C: No results for input(s): HGBA1C in the last 72 hours. CBG: Recent Labs  Lab 11/24/20 1141  GLUCAP 102*  Lipid Profile: No results for input(s): CHOL, HDL, LDLCALC, TRIG, CHOLHDL, LDLDIRECT in the last 72 hours. Thyroid Function Tests: No results for input(s): TSH, T4TOTAL, FREET4, T3FREE, THYROIDAB in the last 72 hours. Anemia Panel: Recent Labs    11/24/20 1331  VITAMINB12 1,311*   Urine analysis:    Component Value Date/Time   COLORURINE YELLOW 04/24/2020 1307   APPEARANCEUR CLEAR 04/24/2020 1307   LABSPEC 1.034 (H) 04/24/2020 1307   PHURINE 7.0 04/24/2020 1307   GLUCOSEU NEGATIVE 04/24/2020 1307   HGBUR MODERATE (A) 04/24/2020 1307   BILIRUBINUR NEGATIVE 04/24/2020 1307   KETONESUR NEGATIVE 04/24/2020 1307   PROTEINUR NEGATIVE 04/24/2020 1307   UROBILINOGEN 0.2 06/04/2015 1533   NITRITE NEGATIVE 04/24/2020 1307   LEUKOCYTESUR TRACE (A) 04/24/2020 1307    Radiological Exams on Admission: CT HEAD WO CONTRAST  Result Date: 11/24/2020 CLINICAL DATA:  Mental status change. EXAM: CT HEAD WITHOUT CONTRAST TECHNIQUE: Contiguous axial images were obtained from the base of the skull through the vertex without intravenous contrast. COMPARISON:  08/14/2020 FINDINGS: Brain: Mild generalized atrophy. Negative for hydrocephalus. Negative for acute infarct, hemorrhage, mass. Vascular: Negative for hyperdense vessel Skull: Negative Sinuses/Orbits: Visualized paranasal sinuses clear. Orbits not included on the study Other: None IMPRESSION: Generalized atrophy.  No acute abnormality and no interval change. Electronically Signed   By: Franchot Gallo M.D.   On: 11/24/2020 13:40   DG Chest Portable 1 View  Result Date: 11/24/2020 CLINICAL DATA:  Weakness with anterior chest wall pain. EXAM: PORTABLE CHEST 1 VIEW  COMPARISON:  05/04/2019 FINDINGS: 1415 hours. The lungs are clear without focal pneumonia, edema, pneumothorax or pleural effusion. The cardiopericardial silhouette is within normal limits for size. Status post cardiac valve replacement. Telemetry leads overlie the chest. IMPRESSION: No active disease. Electronically Signed   By: Misty Stanley M.D.   On: 11/24/2020 14:53   US Abdomen Limited RUQ (LIVER/GB)  Result Date: 11/24/2020 CLINICAL DATA:  Elevated LFTs EXAM: ULTRASOUND ABDOMEN LIMITED RIGHT UPPER QUADRANT COMPARISON:  04/23/2020 FINDINGS: Gallbladder: Surgically removed. Common bile duct: Diameter: 4 mm Liver: Increased echogenicity is noted consistent with fatty infiltration. No focal mass is noted. Portal vein is patent on color Doppler imaging with normal direction of blood flow towards the liver. Other: None. IMPRESSION: Fatty liver. Status post cholecystectomy. Electronically Signed   By: Inez Catalina M.D.   On: 11/24/2020 16:29    EKG: Independently reviewed.  Sinus tachycardia  Assessment/Plan Active Problems:   Vomiting   GI bleed  (please populate well all problems here in Problem List. (For example, if patient is on BP meds at home and you resume or decide to hold them, it is a problem that needs to be her. Same for CAD, COPD, HLD and so on)  Upper GI bleed -Suspect Mallory-Weiss from repeated vomiting and retching -Continue PPI -Clear liquid diet and n.p.o. after midnight -Discussed with [facility]  Frequent nausea vomit -Multiple differentials, can be gastroenteritis, chronic marijuana use and hyperemesis, worsening of pyloric stenosis. -Reglan Q8H  Elevated INR/Coumadin induced coagulopathy -Patient denied overdose of Coumadin. -Her liver synthetic function decreased with lower than normal level of albumin but records result showed only fatty liver but not cirrhosis. -Vitamin K x1 given, desire a slow decrease of INR given history of metal valve.  Acute  transaminitis -Elevated AST/ALT ratio to implying active drinking.  However patient denied. -RUQ ultrasound showed fatty liver similar as last year.  History of pyloric stenosis -As above.  History of alcohol abuse -No  symptoms signs of acute withdrawal, CIWA protocol with as needed benzos  -Diastolic CHF chronic -Dehydration physical exam, hold Lasix.  Agreed with hydration with banana bag tonight.   DVT prophylaxis: INR=8.2 Code Status: Full Code Family Communication: None at bedside Disposition Plan: Expect 2 more days hospital stay for normalization of INR and GI work-up. Consults called: [name] Admission status: Telemetry admission   Lequita Halt MD [facility] Pager [phone]  11/24/2020, 5:05 PM

## 2020-11-24 NOTE — ED Triage Notes (Signed)
Arrived via [facility] from home with c/o weakness x3 days along with anterior chest wall pain this morning. Pt presented alert, lethargic, with slurred speech and weakness on the left side.

## 2020-11-24 NOTE — ED Notes (Signed)
This RN attempted IV access x 1 with no success, Katie RN consulted and attempt IV access with no success, IV team consult placed

## 2020-11-24 NOTE — ED Notes (Signed)
Patient transported to Ultrasound 

## 2020-11-24 NOTE — ED Notes (Signed)
Pt transported to CT ?

## 2020-11-25 ENCOUNTER — Inpatient Hospital Stay (HOSPITAL_COMMUNITY): Payer: Medicaid Other

## 2020-11-25 ENCOUNTER — Encounter (HOSPITAL_COMMUNITY): Payer: Self-pay | Admitting: Internal Medicine

## 2020-11-25 DIAGNOSIS — K920 Hematemesis: Principal | ICD-10-CM

## 2020-11-25 DIAGNOSIS — R791 Abnormal coagulation profile: Secondary | ICD-10-CM

## 2020-11-25 DIAGNOSIS — K311 Adult hypertrophic pyloric stenosis: Secondary | ICD-10-CM

## 2020-11-25 LAB — COMPREHENSIVE METABOLIC PANEL
ALT: 74 U/L — ABNORMAL HIGH (ref 0–44)
AST: 97 U/L — ABNORMAL HIGH (ref 15–41)
Albumin: 2.1 g/dL — ABNORMAL LOW (ref 3.5–5.0)
Alkaline Phosphatase: 146 U/L — ABNORMAL HIGH (ref 38–126)
Anion gap: 10 (ref 5–15)
BUN: 7 mg/dL (ref 6–20)
CO2: 17 mmol/L — ABNORMAL LOW (ref 22–32)
Calcium: 7.8 mg/dL — ABNORMAL LOW (ref 8.9–10.3)
Chloride: 115 mmol/L — ABNORMAL HIGH (ref 98–111)
Creatinine, Ser: 0.89 mg/dL (ref 0.44–1.00)
GFR, Estimated: >60 mL/min (ref >60)
Glucose, Bld: 83 mg/dL (ref 70–99)
Potassium: 3.3 mmol/L — ABNORMAL LOW (ref 3.5–5.1)
Sodium: 142 mmol/L (ref 135–145)
Total Bilirubin: 0.8 mg/dL (ref 0.3–1.2)
Total Protein: 4.7 g/dL — ABNORMAL LOW (ref 6.5–8.1)

## 2020-11-25 LAB — PHOSPHORUS: Phosphorus: 2.1 mg/dL — ABNORMAL LOW (ref 2.5–4.6)

## 2020-11-25 LAB — CBC
HCT: 22.2 % — ABNORMAL LOW (ref 36.0–46.0)
Hemoglobin: 8 g/dL — ABNORMAL LOW (ref 12.0–15.0)
MCH: 42.3 pg — ABNORMAL HIGH (ref 26.0–34.0)
MCHC: 36 g/dL (ref 30.0–36.0)
MCV: 117.5 fL — ABNORMAL HIGH (ref 80.0–100.0)
Platelets: 290 10*3/uL (ref 150–400)
RBC: 1.89 MIL/uL — ABNORMAL LOW (ref 3.87–5.11)
RDW: 18.8 % — ABNORMAL HIGH (ref 11.5–15.5)
WBC: 6 10*3/uL (ref 4.0–10.5)
nRBC: 0 % (ref 0.0–0.2)

## 2020-11-25 LAB — PROTIME-INR
INR: 7.1 (ref 0.8–1.2)
Prothrombin Time: 59 seconds — ABNORMAL HIGH (ref 11.4–15.2)

## 2020-11-25 LAB — MAGNESIUM: Magnesium: 1.4 mg/dL — ABNORMAL LOW (ref 1.7–2.4)

## 2020-11-25 LAB — OSMOLALITY: Osmolality: 296 mOsm/kg — ABNORMAL HIGH (ref 275–295)

## 2020-11-25 LAB — SARS CORONAVIRUS 2 (TAT 6-24 HRS): SARS Coronavirus 2: NEGATIVE

## 2020-11-25 MED ORDER — PANTOPRAZOLE SODIUM 40 MG IV SOLR
40.0000 mg | Freq: Two times a day (BID) | INTRAVENOUS | Status: DC
  Administered 2020-11-25 – 2020-11-26 (×2): 40 mg via INTRAVENOUS
  Filled 2020-11-25 (×2): qty 40

## 2020-11-25 MED ORDER — POTASSIUM PHOSPHATES 15 MMOLE/5ML IV SOLN
20.0000 mmol | Freq: Once | INTRAVENOUS | Status: AC
  Administered 2020-11-25: 20 mmol via INTRAVENOUS
  Filled 2020-11-25: qty 6.67

## 2020-11-25 MED ORDER — VITAMIN K1 10 MG/ML IJ SOLN
5.0000 mg | Freq: Once | INTRAVENOUS | Status: AC
  Administered 2020-11-25: 5 mg via INTRAVENOUS
  Filled 2020-11-25: qty 0.5

## 2020-11-25 NOTE — Progress Notes (Addendum)
Admitted to 2W-12. Assessment completed. Cardiac monitor placed. All needs addressed. Continuing to monitor, Report received from ER Nurse. ER nurse stated that Vitamin K still needs to be administered bc it was not administered in ED.  Contacted Pharmacy.    Asked patient how much Coumadin she has been taking at home and she states, "I missed a few doses and was trying to play catch up w/ extra doses to make up for missed doses."

## 2020-11-25 NOTE — Progress Notes (Signed)
Provider notified of 7.1 INR lab.

## 2020-11-25 NOTE — Consult Note (Addendum)
[facility] Consult: 8:11 AM 11/25/2020  LOS: 1 day    Referring Provider: Dr Doristine Bosworth  Primary Care Physician:  [name], MD [facility] Primary Gastroenterologist:   Dr Havery Moros.     Reason for Consultation:  Nausea, vomiting   HPI: Mary [name] is a 60 y.o. female.  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  Macrocytic anemia.  GERD.  Protein malnutrition.  Abnormal LFTs, fatty liver per CT scan 03/2020.  HCV negative 2018 TIA.  Patient said she had a stroke in 07/2020 but this is not confirmed by review of the discharge summary which stated she had a progressive polymyopathy/neuropathy that may be related to alcohol abuse.  Previous surgeries include but not limited to appendectomy, C-section, tubal ligation, femoral hernia repair, lap chole, cardiac valve surgery.  Recurrent macrocytic anemia.  Folate deficient in 03/2020 eventually normalized. B 12 ok. Thiamine deficiency. Ranges over the last several months between 8 to 10, low of 6.6 in late 07/2020.  MCV 123.   Previous PRBCs transfused 2014 (2), 12/2019 (3),  07/2020 (3).  04/2020 EGD for macrocytic anemia, N/V/abnormal GE junction on CT: Small HH.  Mild, nonspecific gastritis, biopsied (path: Mild, chronic gastritis with erosions, no H. Pylori).  Pyloric stenosis with lumen 4 to 5 mm for about 1 cm length would not allow scope passage.  Area biopsied (path: Pyloric gastric mucosa and duodenal mucosa with peptic changes, no H. Pylori).  May have stenosis related to chronic NSAID use. 04/2020 colonoscopy.  Scattered mild barotrauma due to insufflation of colon.  No polyps or abnormalities otherwise.  Presented to the ED yesterday with a few days of weakness/fatigue, left chest pain  with lightheaded dizziness that morning.  Nausea with initially multiple episodes of nonbloody emesis but hematemesis on the night before and the morning of arrival.  No emesis since yesterday morning.  Generally poor p.o. intake.  Watery, nonbloody stools.  About a month ago weight was 121#, currently the same though she says at home she weighed 106 # recently.  EMS noted left-sided weakness, dysarthria but patient says this is chronic and not changed.  Ongoing chronic back pain refractory to gabapentin which she feels is causing excessive somnolence  INR 8.2.   Hgb 9.9 >> 7.9.  Was 9.8 in early 08/2020.  MCV 120.  K 3.  Mag low 1.6, phos ok.  Mild AKI.   t bili 5.8.  Alk phos 196.  AST/ALT 264/109.   THC positive.  EtOH level less than ten. Abdominal ultrasound: Fatty liver, surgically absent gallbladder.  Doppler studies of portal vein normal. CXR unremarkable.  Received 2.5 mg oral vitamin K.  Protonix 40 mg daily.   No transfusions yet.  Patient lives alone.  Does not smoke or chew tobacco products.  Previously admitted to drinking a half bottle of [place] wine daily, now says she had not had any alcohol since 07/2020.    Past Medical History:  Diagnosis Date  . Abnormal liver function   .  Acute diastolic heart failure ([place]) 03/15/2013  . Alcoholism ([place])   . Anemia   . B12 deficiency   . Carotid stenosis    Carotid US 2/17: bilateral ICA 1-39% >> FU prn  . Childhood asthma   . Chronic diastolic CHF (congestive heart failure) ([place])   . Complete heart block ([place])   . Epigastric hernia 200's  . GERD (gastroesophageal reflux disease)   . Heart murmur   . History of blood transfusion    "14 w/1st pregnancy; 2 w/last C-section" (03/15/2013)  . Hx of echocardiogram    a. Echo 4/16:  Mild focal basal septal hypertrophy, EF 60-65%, no RWMA, Mechanical MVR ok with mild central regurgitation and no perivalvular leak, small mobile density attached to valvular ring (1.5x1 cm) - post surgical  changes vs SBE, mild LAE;   b. Echo 2/17:  Mild post wall LVH, EF 55-60%, no RWMA, mechanical MVR ok (mean 5 mmHg), normal RVSF  . Hypertension    no pcp   will go to [facility]  . Macrocytic anemia   . Protein calorie malnutrition ([place])   . 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 S/P mitral valve replacement with metallic valve 1/61/0960   56mm Sorin Carbomedics mechanical prosthesis via right mini thoracotomy approach  . 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 Severe mitral regurgitation 04/22/2013  . Sinus tachycardia   . SVT (supraventricular tachycardia) ([place]) 03/16/2013  . TIA (transient ischemic attack)   . Tricuspid regurgitation     Past Surgical History:  Procedure Laterality Date  . APPENDECTOMY  [id number]  . BIOPSY  05/01/2020   Procedure: BIOPSY;  Surgeon: Milus Banister, MD;  Location: [place];  Service: Endoscopy;;  . CARDIAC CATHETERIZATION    . CARDIAC VALVE REPLACEMENT    . CESAREAN SECTION  1983  . CESAREAN SECTION WITH BILATERAL TUBAL LIGATION  1999  . COLONOSCOPY WITH PROPOFOL N/A 05/01/2020   Procedure: COLONOSCOPY WITH PROPOFOL;  Surgeon: Milus Banister, MD;  Location: [facility] ENDOSCOPY;  Service: Endoscopy;  Laterality: N/A;  . ESOPHAGOGASTRODUODENOSCOPY (EGD) WITH PROPOFOL N/A 05/01/2020   Procedure: ESOPHAGOGASTRODUODENOSCOPY (EGD) WITH PROPOFOL;  Surgeon: Milus Banister, MD;  Location: [facility] ENDOSCOPY;  Service: Endoscopy;  Laterality: N/A;  . FEMORAL HERNIA REPAIR Right 05/23/2013   Procedure: HERNIA REPAIR FEMORAL;  Surgeon: Rexene Alberts, MD;  Location: [place];  Service: Open Heart Surgery;  Laterality: Right;  . INTRAOPERATIVE TRANSESOPHAGEAL ECHOCARDIOGRAM N/A 05/23/2013   Procedure: INTRAOPERATIVE TRANSESOPHAGEAL ECHOCARDIOGRAM;  Surgeon: Rexene Alberts, MD;  Location: [place];  Service: Open Heart Surgery;  Laterality: N/A;  . LAPAROSCOPIC CHOLECYSTECTOMY  2003  . LEFT AND RIGHT HEART CATHETERIZATION WITH CORONARY ANGIOGRAM N/A 03/22/2013   Procedure: LEFT AND RIGHT HEART CATHETERIZATION WITH CORONARY ANGIOGRAM;  Surgeon: Burnell Blanks, MD;  Location: [facility] CATH LAB;  Service: Cardiovascular;  Laterality: N/A;  . MINIMALLY INVASIVE TRICUSPID VALVE REPAIR Right 05/23/2013   Procedure: MINIMALLY INVASIVE TRICUSPID VALVE REPAIR;  Surgeon: Rexene Alberts, MD;  Location: [place];  Service: Open Heart Surgery;  Laterality: Right;  . MITRAL VALVE REPLACEMENT N/A 05/23/2013   Procedure: MITRAL VALVE (MV) REPLACEMENT;  Surgeon: Rexene Alberts, MD;  Location: [place];  Service: Open Heart Surgery;  Laterality: N/A;  . MULTIPLE EXTRACTIONS WITH ALVEOLOPLASTY N/A 04/04/2013   Procedure: Extraction of tooth #'s 1,8,9,13,14,15,23,24,25,26 with alveoloplasty  and gross debridement of remaining teeth;  Surgeon: Lenn Cal, DDS;  Location: [place];  Service: Oral Surgery;  Laterality: N/A;  . TEE WITHOUT CARDIOVERSION N/A 03/18/2013   Procedure: TRANSESOPHAGEAL ECHOCARDIOGRAM (TEE);  Surgeon: Larey Dresser, MD;  Location: [place];  Service: Cardiovascular;  Laterality: N/A;  . TEE WITHOUT CARDIOVERSION N/A 06/17/2013   Procedure: TRANSESOPHAGEAL ECHOCARDIOGRAM (TEE);  Surgeon: Lelon Perla, MD;  Location: [place];  Service: Cardiovascular;  Laterality: N/A;  . [place]    Prior to Admission medications   Medication Sig Start Date End Date Taking? Authorizing Provider  acetaminophen (TYLENOL) 500 MG tablet Take 1 tablet (500 mg total) by mouth every 6 (six) hours as needed for mild pain or headache. 08/24/20  Yes Domenic Polite, MD  Cyanocobalamin (VITAMIN B-12) 500 MCG TABS Take 500 mcg by mouth daily. 08/25/20  Yes Domenic Polite, MD  folic acid (FOLVITE) 1 MG tablet Take 1 tablet (1 mg total) by mouth daily. 10/06/20 01/04/21 Yes [name],  MD  gabapentin (NEURONTIN) 100 MG capsule Take 1 capsule (100 mg total) by mouth 2 (two) times daily. And continue 300 mg at bedtime 08/27/20  Yes Mayers, Cari S, PA-C  gabapentin (NEURONTIN) 300 MG capsule Take 1 capsule (300 mg total) by mouth at bedtime. 10/06/20  Yes [name], MD  pantoprazole (PROTONIX) 40 MG tablet Take 1 tablet (40 mg total) by mouth daily. 08/27/20 11/25/20 Yes Mayers, Cari S, PA-C  warfarin (COUMADIN) 5 MG tablet Take 2.5mg  to 5mg  daily or as directed by Coumadin clinic Patient taking differently: Take 2.5-5 mg by mouth See admin instructions. Take 2.5mg   tablet by mouth on wed and fridays and then take 5mg  by mouth all other days per patient. 11/17/20  Yes Evans Lance, MD    Scheduled Meds: . folic acid  1 mg Oral Daily  . gabapentin  100 mg Oral Daily   And  . gabapentin  400 mg Oral QHS  . metoCLOPramide  10 mg Oral TID AC  . multivitamin with minerals  1 tablet Oral Daily  . pantoprazole  40 mg Oral Daily  . sodium chloride flush  10-40 mL Intracatheter Q12H  . thiamine  100 mg Oral Daily  . vitamin B-12  500 mcg Oral Daily   Infusions: . phytonadione (VITAMIN K) IV     PRN Meds: acetaminophen, HYDROmorphone (DILAUDID) injection, LORazepam **OR** LORazepam, promethazine, sodium chloride flush   Allergies as of 11/24/2020 - Review Complete 11/24/2020  Allergen Reaction Noted  . Oxycodone Itching 04/15/2013  . Tape Rash and Other (See Comments) 08/14/2020    Family History  Problem Relation Age of Onset  . Stroke Father   . Cancer Father   . Sarcoidosis Sister   . Heart attack Neg Hx   . Colon cancer Neg Hx   . Stomach cancer Neg Hx   . Pancreatic cancer Neg Hx   . Esophageal cancer Neg Hx     Social History   Socioeconomic History  . Marital status: Single    Spouse name: Not on file  . Number of children: Not on file  . Years of education: Not on file  . Highest education level: Not on file  Occupational History  . Not on  file  Tobacco Use  . Smoking status: Former Smoker    Packs/day: 0.50    Years: 36.00    Pack years: 18.00    Types: Cigarettes    Quit  date: 03/15/2013    Years since quitting: 7.7  . Smokeless tobacco: Never Used  Vaping Use  . Vaping Use: Never used  Substance and Sexual Activity  . Alcohol use: Yes    Comment:  "glass of wine"  . Drug use: No  . Sexual activity: Never  Other Topics Concern  . Not on file  Social History Narrative  . Not on file   Social Determinants of Health   Financial Resource Strain: Not on file  Food Insecurity: Not on file  Transportation Needs: Not on file  Physical Activity: Not on file  Stress: Not on file  Social Connections: Not on file  Intimate Partner Violence: Not on file    REVIEW OF SYSTEMS: Constitutional: Weakness, fatigue. ENT:  No nose bleeds Pulm: No shortness of breath.  No cough. CV:  No palpitations, no LE edema.  Nonexertional chest pain, resolved. GU:  No hematuria, no frequency GI: See HPI Heme: Denies excessive or unusual bleeding or bruising. Transfusions: See HPI Neuro:  No headaches.  Positive paresthesias and numbness/tingling in her extremities. Derm:  No itching, no rash or sores.  Endocrine:  No sweats or chills.  No polyuria or dysuria Immunization: Vaccinated for COVID-19. Travel:  None beyond local counties in last few months.    PHYSICAL EXAM: Vital signs in last 24 hours: Vitals:   11/25/20 0128 11/25/20 0400  BP: 107/76   Pulse: 88   Resp: 18 15  Temp: 98 F (36.7 C)   SpO2: 100%    Wt Readings from Last 3 Encounters:  11/24/20 54.9 kg  09/01/20 54.6 kg  08/28/20 56.7 kg    General: Patient sitting at the sink giving herself a sponge bath.  Did not move her to the bed for the exam. Head: Slightly cushingoid facies. Eyes: Exophthalmos.  No scleral icterus, no conjunctival pallor. Ears:  Not HOH Nose:  No discharge Mouth:  Missing front teeth.   Neck:  No JVD.  No TMG or mass Lungs:  Clear bilaterally with excellent breath sounds.  No labored breathing.  No cough Heart: RRR.  No MRG.  S1, S2 present Abdomen: Soft, not tender, not distended no HSM, masses, bruits, hernias..   Rectal: Deferred Musc/Skeltl: No joint redness, swelling or gross deformity. Extremities: No CCE Neurologic: Oriented x3.  Alert.  Speech delayed, effortful, precise, slow.  Moves all 4 limbs, strength not tested.  No resting tremors. Skin: No rash, no sores, no telangiectasia. Nodes: No cervical adenopathy Psych: Flat affect, pleasant, not anxious.  Intake/Output from previous day: 03/01 0701 - 03/02 0700 In: 2240.6 [P.O.:240; I.V.:1000.6; IV Piggyback:1000] Out: -  Intake/Output this shift: No intake/output data recorded.  LAB RESULTS: Recent Labs    11/24/20 1145 11/24/20 2218  WBC 7.6 8.2  HGB 9.9* 7.9*  HCT 28.0* 21.8*  PLT 367 283   BMET Lab Results  Component Value Date   NA 143 11/24/2020   NA 143 11/24/2020   NA 142 08/28/2020   K 3.0 (L) 11/24/2020   K 3.5 11/24/2020   K 4.5 08/28/2020   CL 113 (H) 11/24/2020   CL 113 (H) 11/24/2020   CL 108 (H) 08/28/2020   CO2 17 (L) 11/24/2020   CO2 17 (L) 11/24/2020   CO2 22 08/28/2020   GLUCOSE 77 11/24/2020   GLUCOSE 101 (H) 11/24/2020   GLUCOSE 97 08/28/2020   BUN 6 11/24/2020   BUN 8 11/24/2020   BUN 4 (L) 08/28/2020   CREATININE 1.08 (H) 11/24/2020  CREATININE 1.09 (H) 11/24/2020   CREATININE 0.83 08/28/2020   CALCIUM 8.4 (L) 11/24/2020   CALCIUM 8.5 (L) 11/24/2020   CALCIUM 8.9 08/28/2020   LFT Recent Labs    11/24/20 1145 11/24/20 1656  PROT 5.8* 5.7*  ALBUMIN 2.5* 2.5*  AST 264* 214*  ALT 109* 99*  ALKPHOS 196* 174*  BILITOT 1.0 0.9   PT/INR Lab Results  Component Value Date   INR 8.2 (HH) 11/24/2020   INR 1.8 (A) 11/16/2020   INR 4.2 (A) 10/06/2020   Hepatitis Panel No results for input(s): HEPBSAG, HCVAB, HEPAIGM, HEPBIGM in the last 72 hours. C-Diff No components found for: CDIFF Lipase      Component Value Date/Time   LIPASE 20 11/24/2020 1331    Drugs of Abuse     Component Value Date/Time   LABOPIA NONE DETECTED 11/24/2020 1600   COCAINSCRNUR NONE DETECTED 11/24/2020 1600   LABBENZ NONE DETECTED 11/24/2020 1600   AMPHETMU NONE DETECTED 11/24/2020 1600   THCU POSITIVE (A) 11/24/2020 1600   LABBARB NONE DETECTED 11/24/2020 1600     RADIOLOGY STUDIES: CT HEAD WO CONTRAST  Result Date: 11/24/2020 CLINICAL DATA:  Mental status change. EXAM: CT HEAD WITHOUT CONTRAST TECHNIQUE: Contiguous axial images were obtained from the base of the skull through the vertex without intravenous contrast. COMPARISON:  08/14/2020 FINDINGS: Brain: Mild generalized atrophy. Negative for hydrocephalus. Negative for acute infarct, hemorrhage, mass. Vascular: Negative for hyperdense vessel Skull: Negative Sinuses/Orbits: Visualized paranasal sinuses clear. Orbits not included on the study Other: None IMPRESSION: Generalized atrophy.  No acute abnormality and no interval change. Electronically Signed   By: Franchot Gallo M.D.   On: 11/24/2020 13:40   DG Chest Portable 1 View  Result Date: 11/24/2020 CLINICAL DATA:  Weakness with anterior chest wall pain. EXAM: PORTABLE CHEST 1 VIEW COMPARISON:  05/04/2019 FINDINGS: 1415 hours. The lungs are clear without focal pneumonia, edema, pneumothorax or pleural effusion. The cardiopericardial silhouette is within normal limits for size. Status post cardiac valve replacement. Telemetry leads overlie the chest. IMPRESSION: No active disease. Electronically Signed   By: Misty Stanley M.D.   On: 11/24/2020 14:53   US Abdomen Limited RUQ (LIVER/GB)  Result Date: 11/24/2020 CLINICAL DATA:  Elevated LFTs EXAM: ULTRASOUND ABDOMEN LIMITED RIGHT UPPER QUADRANT COMPARISON:  04/23/2020 FINDINGS: Gallbladder: Surgically removed. Common bile duct: Diameter: 4 mm Liver: Increased echogenicity is noted consistent with fatty infiltration. No focal mass is noted. Portal vein is  patent on color Doppler imaging with normal direction of blood flow towards the liver. Other: None. IMPRESSION: Fatty liver. Status post cholecystectomy. Electronically Signed   By: Inez Catalina M.D.   On: 11/24/2020 16:29      IMPRESSION:   *    Nausea, vomiting, hematemesis  *    Hx pyloric stenosis with benign biopsies of area at EGD 04/2020.  *     Chronic Coumadin for MVR, tricuspid valve repair 2014 as well as hx TIA.  *    Acute on chronic macrocytic anemia.  Hgb drop 2 g after IV fluid resuscitation overnight.  Previously folate deficient.  *   Elevated LFTs.  Fatty liver on both CT and ultrasound within the last 8 months.  No evidence on last years EGD or colonoscopy of sequela of liver disease.  HCV Ab negative in past.  *   THC positive, no elevation alcohol level.    PLAN:     *   EGD, likely not until INR is closer to normal.  Azucena Freed  11/25/2020, 8:11 AM Phone [place] Attending   I have taken an interval history, reviewed the chart and examined the patient. I agree with the Advanced Practitioner's note, impression and recommendations.    Hematemesis nausea vomiting history of pyloric stenosis merit EGD and possible dilation of the pyloric stenosis.  INR needs to be 2 or less.  She went for a while without her warfarin and then tried to load herself which led to the over anticoagulation.  She is aware of her mistake.  Gatha Mayer, MD, [facility] 11/25/2020 5:55 PM

## 2020-11-25 NOTE — Progress Notes (Signed)
PROGRESS NOTE    Mary [name]  IOE:[id number] DOB: 11/14/1960 DOA: 11/24/2020 PCP: [name], MD   Brief Narrative:  HPI: Mary [name] is a 60 y.o. female with medical history significant of mechanical mitral and tricuspid valve replacement 2014 on Coumadin, history of alcohol abuse, chronic diastolic CHF, pyloric stenosis, marijuana use,, history of stroke presented with repeated nausea vomiting and diarrhea.  Patient started to feel frequent nausea and then developed vomiting and diarrhea over the last 3 to 4 days.  She reported every time she want to eat she feels nauseous and then vomited out stomach content, but for 2 times last night this morning, she vomited of bright red blood.  No abdominal pain, and she also has had a loose bowel movement for 5-day, no blood or mucus and responded to over-the-counter Imodium.  She admitted to been using marijuana.  And she has been using more gabapentin last night of 300 mg instead of 100 mg.   ED Course: Negative CT head, blood work showed elevated LFTs, AST 264 ALT 109, INR 8.2.  Assessment & Plan:   Active Problems:   Elevated INR   Vomiting   GI bleed  Upper GI bleed/hematemesis/acute blood loss anemia -Suspect Mallory-Weiss from repeated vomiting and retching.  Hemoglobin dropped from 9.9-8.0.  Monitor every 12 hours.  Continue PPI, will increase dose to twice daily.  Seen by GI.  Plan for EGD but not until her INR becomes normal.  Her hematemesis/upper GI bleed is likely due to retching complicated by coagulopathy. She wants to eat.  Now that there is no plan for EGD today, I will let her eat cardiac diet and will place her n.p.o. from midnight.  Hypokalemia: 3.3.  Replace orally.  Recheck in the morning.  Hypophosphatemia: We will replace.  Mild AKI: Resolved.  Looks euvolemic.  Continue to hold diuretics.  Frequent nausea/vomiting: Could be just simply gastroenteritis, viral or cannabinoid hyperemesis syndrome.  She  also has history of pyloric stenosis.  Symptoms have resolved.  Continue symptomatic treatment.  Elevated INR/Coumadin induced coagulopathy: Received oral vitamin K 2.5 mg yesterday.  INR still 7.1.  We will give her another dose of vitamin K 5 mg IV.  Repeat INR tomorrow morning.  Continue to hold Coumadin.  Elevated LFTs: -Elevated AST/ALT ratio to implying active drinking.  However patient denied. -RUQ ultrasound showed fatty liver similar as last year.  LFTs improving.  No abdominal pain or tenderness.  History of pyloric stenosis -As above.  History of alcohol abuse -No symptoms signs of acute withdrawal, CIWA protocol with as needed benzos.  Alcohol level within normal range.  -Diastolic CHF chronic: Looks euvolemic.  Now recovering from AKI.  Hold diuretic for now.  Reassess tomorrow.  History of stroke: Patient tells me that she had a stroke during Thanksgiving 2021 and since then she has been having slurred speech.  Marijuana abuse: Counseled.  Right lower extremity weakness: Patient complains of right lower extremity weakness since about 1 to 2 weeks.  She also complains of low back pain which also started about 2 weeks ago.  She tells me that she had lumbar puncture done during Thanksgiving when she had stroke.  Her history is very vague.  On examination, she does have positive straight leg raise test.  We will proceed with CT lumbar spine.  Consult PT OT.  DVT prophylaxis:    Code Status: Full Code  Family Communication:  None present at bedside.  Plan of care  discussed with patient in length and he verbalized understanding and agreed with it.  Status is: Inpatient  Remains inpatient appropriate because:Ongoing diagnostic testing needed not appropriate for outpatient work up   Dispo: The patient is from: Home              Anticipated d/c is to: Home              Patient currently is not medically stable to d/c.   Difficult to place patient  No        Estimated body mass index is 22.13 kg/m as calculated from the following:   Height as of this encounter: 5\' 2"  (1.575 m).   Weight as of this encounter: 54.9 kg.      Nutritional status:               Consultants:   GI  Procedures:   None  Antimicrobials:  Anti-infectives (From admission, onward)   None         Subjective: Seen and examined.  Complains of right lower extremity weakness and lower back pain.  No other complaint.  No vomiting or nausea.  No further episode of hematemesis.  Objective: Vitals:   11/24/20 2230 11/25/20 0049 11/25/20 0128 11/25/20 0400  BP: 132/80 115/72 107/76   Pulse: 90 92 88   Resp: 20 17 18 15   Temp:  98 F (36.7 C) 98 F (36.7 C)   TempSrc:  Oral Oral   SpO2: 100% 100% 100%   Weight:      Height:        Intake/Output Summary (Last 24 hours) at 11/25/2020 1050 Last data filed at 11/25/2020 0509 Gross per 24 hour  Intake 2240.56 ml  Output --  Net 2240.56 ml   Filed Weights   11/24/20 1144  Weight: 54.9 kg    Examination:  General exam: Appears calm and comfortable  Respiratory system: Clear to auscultation. Respiratory effort normal. Cardiovascular system: S1 & S2 heard, RRR. No JVD, murmurs, rubs, gallops or clicks. No pedal edema. Gastrointestinal system: Abdomen is nondistended, soft and nontender. No organomegaly or masses felt. Normal bowel sounds heard. Central nervous system: Alert and oriented.  Slurred speech, residual since recent stroke.  Slight weakness in right lower extremity with positive straight leg raise test. Extremities: Symmetric 5 x 5 power. Skin: No rashes, lesions or ulcers Psychiatry: Judgement and insight appear poor   Data Reviewed: I have personally reviewed following labs and imaging studies  CBC: Recent Labs  Lab 11/24/20 1145 11/24/20 2218 11/25/20 0835  WBC 7.6 8.2 6.0  NEUTROABS 5.6  --   --   HGB 9.9* 7.9* 8.0*  HCT 28.0* 21.8* 22.2*  MCV 120.7*  119.8* 117.5*  PLT 367 283 474   Basic Metabolic Panel: Recent Labs  Lab 11/24/20 1145 11/24/20 1656 11/25/20 0835  NA 143 143 142  K 3.5 3.0* 3.3*  CL 113* 113* 115*  CO2 17* 17* 17*  GLUCOSE 101* 77 83  BUN 8 6 7   CREATININE 1.09* 1.08* 0.89  CALCIUM 8.5* 8.4* 7.8*  MG  --  1.6* 1.4*  PHOS  --  2.8 2.1*   GFR: Estimated Creatinine Clearance: 54.5 mL/min (by C-G formula based on SCr of 0.89 mg/dL). Liver Function Tests: Recent Labs  Lab 11/24/20 1145 11/24/20 1656 11/25/20 0835  AST 264* 214* 97*  ALT 109* 99* 74*  ALKPHOS 196* 174* 146*  BILITOT 1.0 0.9 0.8  PROT 5.8* 5.7* 4.7*  ALBUMIN 2.5* 2.5*  2.1*   Recent Labs  Lab 11/24/20 1331  LIPASE 20   No results for input(s): AMMONIA in the last 168 hours. Coagulation Profile: Recent Labs  Lab 11/24/20 1145 11/25/20 0835  INR 8.2* 7.1*   Cardiac Enzymes: No results for input(s): CKTOTAL, CKMB, CKMBINDEX, TROPONINI in the last 168 hours. BNP (last 3 results) No results for input(s): PROBNP in the last 8760 hours. HbA1C: No results for input(s): HGBA1C in the last 72 hours. CBG: Recent Labs  Lab 11/24/20 1141  GLUCAP 102*   Lipid Profile: No results for input(s): CHOL, HDL, LDLCALC, TRIG, CHOLHDL, LDLDIRECT in the last 72 hours. Thyroid Function Tests: No results for input(s): TSH, T4TOTAL, FREET4, T3FREE, THYROIDAB in the last 72 hours. Anemia Panel: Recent Labs    11/24/20 1331  VITAMINB12 1,311*   Sepsis Labs: No results for input(s): PROCALCITON, LATICACIDVEN in the last 168 hours.  Recent Results (from the past 240 hour(s))  SARS CORONAVIRUS 2 (TAT 6-24 HRS) Nasopharyngeal Nasopharyngeal Swab     Status: None   Collection Time: 11/24/20 10:18 PM   Specimen: Nasopharyngeal Swab  Result Value Ref Range Status   SARS Coronavirus 2 NEGATIVE NEGATIVE Final    Comment: (NOTE) SARS-CoV-2 target nucleic acids are NOT DETECTED.  The SARS-CoV-2 RNA is generally detectable in upper and  lower respiratory specimens during the acute phase of infection. Negative results do not preclude SARS-CoV-2 infection, do not rule out co-infections with other pathogens, and should not be used as the sole basis for treatment or other patient management decisions. Negative results must be combined with clinical observations, patient history, and epidemiological information. The expected result is Negative.  Fact Sheet for Patients: SugarRoll.be  Fact Sheet for Healthcare Providers: https://www.woods-mathews.com/  This test is not yet approved or cleared by the [place] FDA and  has been authorized for detection and/or diagnosis of SARS-CoV-2 by FDA under an Emergency Use Authorization (EUA). This EUA will remain  in effect (meaning this test can be used) for the duration of the COVID-19 declaration under Se ction 564(b)(1) of the Act, 21 U.S.C. section 360bbb-3(b)(1), unless the authorization is terminated or revoked sooner.  Performed at [facility], [place] [address] [place], [place] [address]       Radiology Studies: CT HEAD WO CONTRAST  Result Date: 11/24/2020 CLINICAL DATA:  Mental status change. EXAM: CT HEAD WITHOUT CONTRAST TECHNIQUE: Contiguous axial images were obtained from the base of the skull through the vertex without intravenous contrast. COMPARISON:  08/14/2020 FINDINGS: Brain: Mild generalized atrophy. Negative for hydrocephalus. Negative for acute infarct, hemorrhage, mass. Vascular: Negative for hyperdense vessel Skull: Negative Sinuses/Orbits: Visualized paranasal sinuses clear. Orbits not included on the study Other: None IMPRESSION: Generalized atrophy.  No acute abnormality and no interval change. Electronically Signed   By: Franchot Gallo M.D.   On: 11/24/2020 13:40   DG Chest Portable 1 View  Result Date: 11/24/2020 CLINICAL DATA:  Weakness with anterior chest wall pain. EXAM: PORTABLE CHEST 1 VIEW  COMPARISON:  05/04/2019 FINDINGS: 1415 hours. The lungs are clear without focal pneumonia, edema, pneumothorax or pleural effusion. The cardiopericardial silhouette is within normal limits for size. Status post cardiac valve replacement. Telemetry leads overlie the chest. IMPRESSION: No active disease. Electronically Signed   By: Misty Stanley M.D.   On: 11/24/2020 14:53   US Abdomen Limited RUQ (LIVER/GB)  Result Date: 11/24/2020 CLINICAL DATA:  Elevated LFTs EXAM: ULTRASOUND ABDOMEN LIMITED RIGHT UPPER QUADRANT COMPARISON:  04/23/2020 FINDINGS: Gallbladder: Surgically  removed. Common bile duct: Diameter: 4 mm Liver: Increased echogenicity is noted consistent with fatty infiltration. No focal mass is noted. Portal vein is patent on color Doppler imaging with normal direction of blood flow towards the liver. Other: None. IMPRESSION: Fatty liver. Status post cholecystectomy. Electronically Signed   By: Inez Catalina M.D.   On: 11/24/2020 16:29    Scheduled Meds: . folic acid  1 mg Oral Daily  . gabapentin  100 mg Oral Daily   And  . gabapentin  400 mg Oral QHS  . metoCLOPramide  10 mg Oral TID AC  . multivitamin with minerals  1 tablet Oral Daily  . pantoprazole  40 mg Oral Daily  . sodium chloride flush  10-40 mL Intracatheter Q12H  . thiamine  100 mg Oral Daily  . vitamin B-12  500 mcg Oral Daily   Continuous Infusions: . phytonadione (VITAMIN K) IV    . phytonadione (VITAMIN K) IV 5 mg (11/25/20 1042)     LOS: 1 day   Time spent: 38-minute   Darliss Cheney, MD [facility]  11/25/2020, 10:50 AM   To contact the attending provider between 7A-7P or the covering provider during after hours 7P-7A, please log into the web site [email or web address].

## 2020-11-26 ENCOUNTER — Inpatient Hospital Stay (HOSPITAL_COMMUNITY): Payer: Medicaid Other

## 2020-11-26 DIAGNOSIS — Z5181 Encounter for therapeutic drug level monitoring: Secondary | ICD-10-CM

## 2020-11-26 DIAGNOSIS — Z7901 Long term (current) use of anticoagulants: Secondary | ICD-10-CM

## 2020-11-26 DIAGNOSIS — D539 Nutritional anemia, unspecified: Secondary | ICD-10-CM

## 2020-11-26 DIAGNOSIS — Z952 Presence of prosthetic heart valve: Secondary | ICD-10-CM

## 2020-11-26 DIAGNOSIS — D62 Acute posthemorrhagic anemia: Secondary | ICD-10-CM

## 2020-11-26 LAB — CBC
HCT: 16.3 % — ABNORMAL LOW (ref 36.0–46.0)
HCT: 27 % — ABNORMAL LOW (ref 36.0–46.0)
Hemoglobin: 6.4 g/dL — CL (ref 12.0–15.0)
Hemoglobin: 9.6 g/dL — ABNORMAL LOW (ref 12.0–15.0)
MCH: 46.7 pg — ABNORMAL HIGH (ref 26.0–34.0)
MCH: 38.9 pg — ABNORMAL HIGH (ref 26.0–34.0)
MCHC: 39.3 g/dL — ABNORMAL HIGH (ref 30.0–36.0)
MCHC: 35.6 g/dL (ref 30.0–36.0)
MCV: 119 fL — ABNORMAL HIGH (ref 80.0–100.0)
MCV: 109.3 fL — ABNORMAL HIGH (ref 80.0–100.0)
Platelets: 233 10*3/uL (ref 150–400)
Platelets: 265 10*3/uL (ref 150–400)
RBC: 1.37 MIL/uL — ABNORMAL LOW (ref 3.87–5.11)
RBC: 2.47 MIL/uL — ABNORMAL LOW (ref 3.87–5.11)
RDW: 18.3 % — ABNORMAL HIGH (ref 11.5–15.5)
WBC: 4.5 10*3/uL (ref 4.0–10.5)
WBC: 6.2 10*3/uL (ref 4.0–10.5)
nRBC: 0 % (ref 0.0–0.2)
nRBC: 0 % (ref 0.0–0.2)

## 2020-11-26 LAB — COMPREHENSIVE METABOLIC PANEL
ALT: 45 U/L — ABNORMAL HIGH (ref 0–44)
AST: 38 U/L (ref 15–41)
Albumin: 1.6 g/dL — ABNORMAL LOW (ref 3.5–5.0)
Alkaline Phosphatase: 97 U/L (ref 38–126)
Anion gap: 7 (ref 5–15)
BUN: 5 mg/dL — ABNORMAL LOW (ref 6–20)
CO2: 16 mmol/L — ABNORMAL LOW (ref 22–32)
Calcium: 6.9 mg/dL — ABNORMAL LOW (ref 8.9–10.3)
Chloride: 117 mmol/L — ABNORMAL HIGH (ref 98–111)
Creatinine, Ser: 0.75 mg/dL (ref 0.44–1.00)
GFR, Estimated: >60 mL/min (ref >60)
Glucose, Bld: 70 mg/dL (ref 70–99)
Potassium: 2.6 mmol/L — CL (ref 3.5–5.1)
Sodium: 140 mmol/L (ref 135–145)
Total Bilirubin: 0.9 mg/dL (ref 0.3–1.2)
Total Protein: 3.7 g/dL — ABNORMAL LOW (ref 6.5–8.1)

## 2020-11-26 LAB — PREPARE RBC (CROSSMATCH)

## 2020-11-26 LAB — MAGNESIUM: Magnesium: 1.1 mg/dL — ABNORMAL LOW (ref 1.7–2.4)

## 2020-11-26 LAB — PROTIME-INR
INR: 2.5 — ABNORMAL HIGH (ref 0.8–1.2)
Prothrombin Time: 26.4 seconds — ABNORMAL HIGH (ref 11.4–15.2)

## 2020-11-26 MED ORDER — PANTOPRAZOLE SODIUM 40 MG PO TBEC
40.0000 mg | DELAYED_RELEASE_TABLET | Freq: Two times a day (BID) | ORAL | Status: DC
  Administered 2020-11-26 – 2020-12-03 (×13): 40 mg via ORAL
  Filled 2020-11-26 (×14): qty 1

## 2020-11-26 MED ORDER — IOHEXOL 9 MG/ML PO SOLN
500.0000 mL | ORAL | Status: AC
  Administered 2020-11-26 (×2): 500 mL via ORAL

## 2020-11-26 MED ORDER — IOHEXOL 300 MG/ML  SOLN
100.0000 mL | Freq: Once | INTRAMUSCULAR | Status: AC | PRN
  Administered 2020-11-26: 100 mL via INTRAVENOUS

## 2020-11-26 MED ORDER — MAGNESIUM SULFATE 2 GM/50ML IV SOLN
2.0000 g | Freq: Once | INTRAVENOUS | Status: AC
  Administered 2020-11-26: 2 g via INTRAVENOUS
  Filled 2020-11-26: qty 50

## 2020-11-26 MED ORDER — SODIUM CHLORIDE 0.9% IV SOLUTION: Freq: Once | INTRAVENOUS | Status: AC

## 2020-11-26 MED ORDER — POTASSIUM CHLORIDE CRYS ER 20 MEQ PO TBCR
40.0000 meq | EXTENDED_RELEASE_TABLET | ORAL | Status: AC
  Administered 2020-11-26 (×3): 40 meq via ORAL
  Filled 2020-11-26 (×3): qty 2

## 2020-11-26 NOTE — Progress Notes (Addendum)
Daily Rounding Note  11/26/2020, 11:47 AM  LOS: 2 days   SUBJECTIVE:   Chief complaint: Nausea, vomiting, eventual hematemesis. Supratherapeutic INR.    Last episode of vomiting was in triage yesterday morning.  Tolerated solid food last night.  No bowel movements since Tuesday.  No abdominal pain. Currently drinking oral contrast for CTAP ordered by hospitalist to evaluate bilateral pelvic mass seen on yesterday's spine films.   Still having pain and weakness in her legs.  OBJECTIVE:         Vital signs in last 24 hours:    Temp:  [98.2 F (36.8 C)-98.9 F (37.2 C)] 98.2 F (36.8 C) (03/03 0504) Pulse Rate:  [89-115] 115 (03/03 0504) Resp:  [18-20] 18 (03/03 0504) BP: (98-109)/(59-68) 104/68 (03/03 0504) SpO2:  [91 %-100 %] 100 % (03/03 0504) Last BM Date: 11/24/20 Filed Weights   11/24/20 1144  Weight: 54.9 kg   General: Pleasant.  Thin.  Looks somewhat chronically ill but comfortable.  Alert. Heart: RRR. Chest: Clear bilaterally without labored breathing Abdomen: Soft, not tender or distended. Extremities: No CCE. Neuro/Psych:  Pleasant, alert, no confusion, weakness in LE.     Lab Results: Recent Labs    11/24/20 2218 11/25/20 0835 11/25/20 2347  WBC 8.2 6.0 4.5  HGB 7.9* 8.0* 6.4*  HCT 21.8* 22.2* 16.3*  PLT 283 290 233   BMET Recent Labs    11/24/20 1656 11/25/20 0835 11/25/20 2347  NA 143 142 140  K 3.0* 3.3* 2.6*  CL 113* 115* 117*  CO2 17* 17* 16*  GLUCOSE 77 83 70  BUN 6 7 5*  CREATININE 1.08* 0.89 0.75  CALCIUM 8.4* 7.8* 6.9*   LFT Recent Labs    11/24/20 1656 11/25/20 0835 11/25/20 2347  PROT 5.7* 4.7* 3.7*  ALBUMIN 2.5* 2.1* 1.6*  AST 214* 97* 38  ALT 99* 74* 45*  ALKPHOS 174* 146* 97  BILITOT 0.9 0.8 0.9   PT/INR Recent Labs    11/25/20 0835 11/25/20 2347  LABPROT 59.0* 26.4*  INR 7.1* 2.5*    Studies/Results:  Study Result  Narrative & Impression  CLINICAL  DATA:  Low back pain with right leg pain.  EXAM: CT LUMBAR SPINE WITHOUT CONTRAST  TECHNIQUE: Multidetector CT imaging of the lumbar spine was performed without intravenous contrast administration. Multiplanar CT image reconstructions were also generated.  COMPARISON:  CT abdomen pelvis 04/23/2020  FINDINGS: Segmentation: Normal  Alignment: Normal  Vertebrae: Negative for fracture or mass  Paraspinal and other soft tissues: Negative for paraspinous mass or adenopathy. Surgical clips in the gallbladder fossa.  Large area of lobular soft tissue density in the pelvis bilaterally left greater than right. This does not contain fat or gas. This could be adherent bowel loops versus a pelvic mass. Similar changes seen in the right pelvis on the prior study but not in the left pelvis.  Disc levels: L1-2: Negative  L2-3: Negative  L3-4: Mild disc bulging.  Negative for stenosis.  L4-5: Mild disc bulging.  Negative for disc protrusion or stenosis.  L5-S1: Negative  IMPRESSION: Very mild lumbar degenerative change. Negative for neural impingement or stenosis.  Lobular large areas of soft tissue density in the pelvis bilaterally which could be adherent bowel loops versus a large pelvic mass. Recommend CT abdomen pelvis with oral and IV contrast for further evaluation.   Electronically Signed   By: Franchot Gallo M.D.   On: 11/25/2020 14:43  ASSESMENT:   *   N/V and eventual hematemesis.   Pyloric stenosis, biopsies benign on EGD 04/2020.  *    Chronic Coumadin. S/p MVR, tricuspid valve repair 2014 and hx TIA. INR supratherapeutic after taking extra doses of Coumadin post missing doses for a few days. INR 8.2 >> 2.5  *    Acute on chronic macrocytic anemia. Hgb 9.9 >> 6.4 >> PRBC x 1 ordered.  *    Elevated LFTs. Fatty liver on CT and ultrasounds over previous months. No sequela of liver disease at EGD or colonoscopy in 04/2020.  *   Lower  extremity pain, weakness.  Spine films yesterday shows mild lumbar degenerative disease without impingement or stenosis.  Bilateral pelvic soft tissue densities, ? adherent loops of bowel vs large pelvic mass.   PLAN   *   EGD tmrw?  Will confer with Dr Carlean Purl.    *    Complete the ordered CTAP for evaluation of the pelvic abnormality  *   Resume HH diet after CT completed.    *   Switch to po Protonix bid    Mary [name]  11/26/2020, 11:47 AM Phone [place] Attending   I have taken an interval history, reviewed the chart and examined the patient. I agree with the Advanced Practitioner's note, impression and recommendations.   Await CT abdomen and pelvis  I do think we should try to do an EGD tomorrow her INR should be less than 2 because she may need pyloric stenosis dilation.  This may need to be in the afternoon due to schedule availability of me and Dr. Ventura Bruns as I am committed to do procedures at [facility] in the morning  Patient is aware of plan to make her n.p.o. after midnight and to recheck INR for potential EGD tomorrow  Gatha Mayer, MD, [facility] 11/26/2020 1:14 PM  [phone] pager

## 2020-11-26 NOTE — Progress Notes (Signed)
11/26/2020  Lab called a critical Potassium 2.6 at 0734. Dr Darliss Cheney was notifies at 0800. Order was given for potassium 40 mg po times 3 doses every 4 hours.

## 2020-11-26 NOTE — Progress Notes (Signed)
@   0555 Lab called for critical lab of a hemoglobin of 6.4. MD notified

## 2020-11-26 NOTE — H&P (View-Only) (Signed)
Daily Rounding Note  11/26/2020, 11:47 AM  LOS: 2 days   SUBJECTIVE:   Chief complaint: Nausea, vomiting, eventual hematemesis. Supratherapeutic INR.    Last episode of vomiting was in triage yesterday morning.  Tolerated solid food last night.  No bowel movements since Tuesday.  No abdominal pain. Currently drinking oral contrast for CTAP ordered by hospitalist to evaluate bilateral pelvic mass seen on yesterday's spine films.   Still having pain and weakness in her legs.  OBJECTIVE:         Vital signs in last 24 hours:    Temp:  [98.2 F (36.8 C)-98.9 F (37.2 C)] 98.2 F (36.8 C) (03/03 0504) Pulse Rate:  [89-115] 115 (03/03 0504) Resp:  [18-20] 18 (03/03 0504) BP: (98-109)/(59-68) 104/68 (03/03 0504) SpO2:  [91 %-100 %] 100 % (03/03 0504) Last BM Date: 11/24/20 Filed Weights   11/24/20 1144  Weight: 54.9 kg   General: Pleasant.  Thin.  Looks somewhat chronically ill but comfortable.  Alert. Heart: RRR. Chest: Clear bilaterally without labored breathing Abdomen: Soft, not tender or distended. Extremities: No CCE. Neuro/Psych:  Pleasant, alert, no confusion, weakness in LE.     Lab Results: Recent Labs    11/24/20 2218 11/25/20 0835 11/25/20 2347  WBC 8.2 6.0 4.5  HGB 7.9* 8.0* 6.4*  HCT 21.8* 22.2* 16.3*  PLT 283 290 233   BMET Recent Labs    11/24/20 1656 11/25/20 0835 11/25/20 2347  NA 143 142 140  K 3.0* 3.3* 2.6*  CL 113* 115* 117*  CO2 17* 17* 16*  GLUCOSE 77 83 70  BUN 6 7 5*  CREATININE 1.08* 0.89 0.75  CALCIUM 8.4* 7.8* 6.9*   LFT Recent Labs    11/24/20 1656 11/25/20 0835 11/25/20 2347  PROT 5.7* 4.7* 3.7*  ALBUMIN 2.5* 2.1* 1.6*  AST 214* 97* 38  ALT 99* 74* 45*  ALKPHOS 174* 146* 97  BILITOT 0.9 0.8 0.9   PT/INR Recent Labs    11/25/20 0835 11/25/20 2347  LABPROT 59.0* 26.4*  INR 7.1* 2.5*    Studies/Results:  Study Result  Narrative & Impression  CLINICAL  DATA:  Low back pain with right leg pain.  EXAM: CT LUMBAR SPINE WITHOUT CONTRAST  TECHNIQUE: Multidetector CT imaging of the lumbar spine was performed without intravenous contrast administration. Multiplanar CT image reconstructions were also generated.  COMPARISON:  CT abdomen pelvis 04/23/2020  FINDINGS: Segmentation: Normal  Alignment: Normal  Vertebrae: Negative for fracture or mass  Paraspinal and other soft tissues: Negative for paraspinous mass or adenopathy. Surgical clips in the gallbladder fossa.  Large area of lobular soft tissue density in the pelvis bilaterally left greater than right. This does not contain fat or gas. This could be adherent bowel loops versus a pelvic mass. Similar changes seen in the right pelvis on the prior study but not in the left pelvis.  Disc levels: L1-2: Negative  L2-3: Negative  L3-4: Mild disc bulging.  Negative for stenosis.  L4-5: Mild disc bulging.  Negative for disc protrusion or stenosis.  L5-S1: Negative  IMPRESSION: Very mild lumbar degenerative change. Negative for neural impingement or stenosis.  Lobular large areas of soft tissue density in the pelvis bilaterally which could be adherent bowel loops versus a large pelvic mass. Recommend CT abdomen pelvis with oral and IV contrast for further evaluation.   Electronically Signed   By: Franchot Gallo M.D.   On: 11/25/2020 14:43  ASSESMENT:   *   N/V and eventual hematemesis.   Pyloric stenosis, biopsies benign on EGD 04/2020.  *    Chronic Coumadin. S/p MVR, tricuspid valve repair 2014 and hx TIA. INR supratherapeutic after taking extra doses of Coumadin post missing doses for a few days. INR 8.2 >> 2.5  *    Acute on chronic macrocytic anemia. Hgb 9.9 >> 6.4 >> PRBC x 1 ordered.  *    Elevated LFTs. Fatty liver on CT and ultrasounds over previous months. No sequela of liver disease at EGD or colonoscopy in 04/2020.  *   Lower  extremity pain, weakness.  Spine films yesterday shows mild lumbar degenerative disease without impingement or stenosis.  Bilateral pelvic soft tissue densities, ? adherent loops of bowel vs large pelvic mass.   PLAN   *   EGD tmrw?  Will confer with Dr Carlean Purl.    *    Complete the ordered CTAP for evaluation of the pelvic abnormality  *   Resume HH diet after CT completed.    *   Switch to po Protonix bid    Azucena Freed  11/26/2020, 11:47 AM Phone [place] Attending   I have taken an interval history, reviewed the chart and examined the patient. I agree with the Advanced Practitioner's note, impression and recommendations.   Await CT abdomen and pelvis  I do think we should try to do an EGD tomorrow her INR should be less than 2 because she may need pyloric stenosis dilation.  This may need to be in the afternoon due to schedule availability of me and Dr. Ventura Bruns as I am committed to do procedures at [facility] in the morning  Patient is aware of plan to make her n.p.o. after midnight and to recheck INR for potential EGD tomorrow  Gatha Mayer, MD, [facility] 11/26/2020 1:14 PM  [phone] pager

## 2020-11-26 NOTE — Progress Notes (Signed)
PROGRESS NOTE    Mary [name]  CZY:[id number] DOB: 02-Oct-1960 DOA: 11/24/2020 PCP: [name], MD   Brief Narrative:  Mary [name] is a 60 y.o. 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 No abdominal pain, and she also has had a loose bowel movement for 5-day. She admitted to been using marijuana.  Upon arrival to ED, she was hemodynamically stable.  CT head was unremarkable. She had elevated LFTs for which ultrasound abdomen was done which showed fatty liver similar to last year.  She had mild AKI as well as supratherapeutic INR of 8.2.  She received 2.5 mg of oral vitamin K.  GI was consulted.   Assessment & Plan:   Active Problems:   Elevated INR   Vomiting   GI bleed  Upper GI bleed/hematemesis/acute blood loss anemia -Suspect Mallory-Weiss from repeated vomiting and retching.  Hemoglobin dropped from 9.9 at the time of admission to 6.4 this morning for which patient received 1 unit of PRBC transfusion.  Protonix switched to oral twice daily by GI.  GI plans to do EGD tomorrow morning.  Repeat INR in the morning.  Continue to hold any anticoagulation.  She has not had any hematemesis since admission.  Monitor CBC every 12 hours.  Transfuse if hemoglobin less than 7.  History of mechanical mitral and tricuspid valve: Due to significant cardiac history and the need for keeping her INR in therapeutic range and now that it is highly contraindicated due to active GI bleeding, I have consulted cardiology for their opinion to prevent any sort of complications.  Unfortunately due to active bleeding, cannot place her on IV heparin either.  Severe hypokalemia: 2.6.  Will replace aggressively.  Recheck in the morning.  Hypomagnesemia: 1.1 last  evening.  Will replace through IV.  Recheck in the morning.  Hypophosphatemia: We will replace.  Mild AKI: Resolved.  Looks euvolemic.  Continue to hold diuretics.  Frequent nausea/vomiting: Could be just simply gastroenteritis, viral or cannabinoid hyperemesis syndrome.  She also has history of pyloric stenosis.  Symptoms have resolved.  Continue symptomatic treatment.  EGD tomorrow by GI.  Elevated INR/Coumadin induced coagulopathy: Presented with INR of 8.2, received oral vitamin K 2.5 followed by another 5 mg on 11/25/2020.  INR 2.5.  Continue to hold anticoagulation due to planned EGD and possible need of pyloric stenosis dilatation tomorrow.  Elevated LFTs: -Elevated AST/ALT ratio to implying active drinking.  However patient denied. -RUQ ultrasound showed fatty liver similar as last year.  LFTs improving.  No abdominal pain or tenderness.  History of pyloric stenosis -As above.  History of alcohol abuse -No symptoms signs of acute withdrawal, CIWA protocol with as needed benzos.  Alcohol level within normal range.  -Diastolic CHF chronic: Looks euvolemic.  Now recovering from AKI.  Hold diuretic for now.  Reassess tomorrow.  History of stroke: Patient tells me that she had a stroke during Thanksgiving 2021 and since then she has been having slurred speech.  Marijuana abuse: Counseled.  Right lower extremity weakness: Patient complained of right lower extremity weakness since about 1 to 2 weeks.  She also complains of low back pain which also started about 2 weeks ago.  She tells me that she had lumbar puncture done during Thanksgiving when she had stroke.  Her history is very  vague.  On examination, she does have positive straight leg raise test.  CT lumbar spine did not show any acute pathology however it shows some suspicion of possible [place] mass and radiology recommends CT abdomen and pelvis with IV and oral contrast which has been ordered.  Patient initially did not have any  complaint but after asking more direct questions, she did endorse having some pelvic pressure.  DVT prophylaxis:    Code Status: Full Code  Family Communication:  None present at bedside.  Plan of care discussed with patient in length and he verbalized understanding and agreed with it.  Status is: Inpatient  Remains inpatient appropriate because:Ongoing diagnostic testing needed not appropriate for outpatient work up   Dispo: The patient is from: Home              Anticipated d/c is to: Home              Patient currently is not medically stable to d/c.   Difficult to place patient No        Estimated body mass index is 22.13 kg/m as calculated from the following:   Height as of this encounter: 5\' 2"  (1.575 m).   Weight as of this encounter: 54.9 kg.      Nutritional status:               Consultants:   GI  Procedures:   None  Antimicrobials:  Anti-infectives (From admission, onward)   None         Subjective: Seen and examined.  No new complaint.  Right lower extremity weakness has improved today.  Objective: Vitals:   11/25/20 0400 11/25/20 1603 11/25/20 1959 11/26/20 0504  BP:  109/62 (!) 98/59 104/68  Pulse:  89 99 (!) 115  Resp: 15 20 18 18   Temp:  98.9 F (37.2 C) 98.5 F (36.9 C) 98.2 F (36.8 C)  TempSrc:   Oral Oral  SpO2:  100% 91% 100%  Weight:      Height:        Intake/Output Summary (Last 24 hours) at 11/26/2020 1324 Last data filed at 11/26/2020 0318 Gross per 24 hour  Intake --  Output 1 ml  Net -1 ml   Filed Weights   11/24/20 1144  Weight: 54.9 kg    Examination:  General exam: Appears calm and comfortable  Respiratory system: Clear to auscultation. Respiratory effort normal. Cardiovascular system: S1 & S2 heard, RRR. No JVD, murmurs, rubs, gallops or clicks. No pedal edema. Gastrointestinal system: Abdomen is nondistended, soft and nontender. No organomegaly or masses felt. Normal bowel sounds heard. Central  nervous system: Alert and oriented.  Slurred speech as a residual deficit from previous stroke but otherwise no acute focal neurological deficit. Extremities: Symmetric 5 x 5 power. Skin: No rashes, lesions or ulcers.  Psychiatry: Judgement and insight appear poor.   Data Reviewed: I have personally reviewed following labs and imaging studies  CBC: Recent Labs  Lab 11/24/20 1145 11/24/20 2218 11/25/20 0835 11/25/20 2347  WBC 7.6 8.2 6.0 4.5  NEUTROABS 5.6  --   --   --   HGB 9.9* 7.9* 8.0* 6.4*  HCT 28.0* 21.8* 22.2* 16.3*  MCV 120.7* 119.8* 117.5* 119.0*  PLT 367 283 290 696   Basic Metabolic Panel: Recent Labs  Lab 11/24/20 1145 11/24/20 1656 11/25/20 0835 11/25/20 2347  NA 143 143 142 140  K 3.5 3.0* 3.3* 2.6*  CL 113* 113* 115* 117*  CO2 17* 17*  17* 16*  GLUCOSE 101* 77 83 70  BUN 8 6 7  5*  CREATININE 1.09* 1.08* 0.89 0.75  CALCIUM 8.5* 8.4* 7.8* 6.9*  MG  --  1.6* 1.4* 1.1*  PHOS  --  2.8 2.1*  --    GFR: Estimated Creatinine Clearance: 60.6 mL/min (by C-G formula based on SCr of 0.75 mg/dL). Liver Function Tests: Recent Labs  Lab 11/24/20 1145 11/24/20 1656 11/25/20 0835 11/25/20 2347  AST 264* 214* 97* 38  ALT 109* 99* 74* 45*  ALKPHOS 196* 174* 146* 97  BILITOT 1.0 0.9 0.8 0.9  PROT 5.8* 5.7* 4.7* 3.7*  ALBUMIN 2.5* 2.5* 2.1* 1.6*   Recent Labs  Lab 11/24/20 1331  LIPASE 20   No results for input(s): AMMONIA in the last 168 hours. Coagulation Profile: Recent Labs  Lab 11/24/20 1145 11/25/20 0835 11/25/20 2347  INR 8.2* 7.1* 2.5*   Cardiac Enzymes: No results for input(s): CKTOTAL, CKMB, CKMBINDEX, TROPONINI in the last 168 hours. BNP (last 3 results) No results for input(s): PROBNP in the last 8760 hours. HbA1C: No results for input(s): HGBA1C in the last 72 hours. CBG: Recent Labs  Lab 11/24/20 1141  GLUCAP 102*   Lipid Profile: No results for input(s): CHOL, HDL, LDLCALC, TRIG, CHOLHDL, LDLDIRECT in the last 72  hours. Thyroid Function Tests: No results for input(s): TSH, T4TOTAL, FREET4, T3FREE, THYROIDAB in the last 72 hours. Anemia Panel: Recent Labs    11/24/20 1331  VITAMINB12 1,311*   Sepsis Labs: No results for input(s): PROCALCITON, LATICACIDVEN in the last 168 hours.  Recent Results (from the past 240 hour(s))  SARS CORONAVIRUS 2 (TAT 6-24 HRS) Nasopharyngeal Nasopharyngeal Swab     Status: None   Collection Time: 11/24/20 10:18 PM   Specimen: Nasopharyngeal Swab  Result Value Ref Range Status   SARS Coronavirus 2 NEGATIVE NEGATIVE Final    Comment: (NOTE) SARS-CoV-2 target nucleic acids are NOT DETECTED.  The SARS-CoV-2 RNA is generally detectable in upper and lower respiratory specimens during the acute phase of infection. Negative results do not preclude SARS-CoV-2 infection, do not rule out co-infections with other pathogens, and should not be used as the sole basis for treatment or other patient management decisions. Negative results must be combined with clinical observations, patient history, and epidemiological information. The expected result is Negative.  Fact Sheet for Patients: SugarRoll.be  Fact Sheet for Healthcare Providers: https://www.woods-mathews.com/  This test is not yet approved or cleared by the [place] FDA and  has been authorized for detection and/or diagnosis of SARS-CoV-2 by FDA under an Emergency Use Authorization (EUA). This EUA will remain  in effect (meaning this test can be used) for the duration of the COVID-19 declaration under Se ction 564(b)(1) of the Act, 21 U.S.C. section 360bbb-3(b)(1), unless the authorization is terminated or revoked sooner.  Performed at [facility], [place] [address] [place], [place] [address]       Radiology Studies: CT LUMBAR SPINE WO CONTRAST  Result Date: 11/25/2020 CLINICAL DATA:  Low back pain with right leg pain. EXAM: CT LUMBAR SPINE WITHOUT  CONTRAST TECHNIQUE: Multidetector CT imaging of the lumbar spine was performed without intravenous contrast administration. Multiplanar CT image reconstructions were also generated. COMPARISON:  CT abdomen pelvis 04/23/2020 FINDINGS: Segmentation: Normal Alignment: Normal Vertebrae: Negative for fracture or mass Paraspinal and other soft tissues: Negative for paraspinous mass or adenopathy. Surgical clips in the gallbladder fossa. Large area of lobular soft tissue density in the pelvis bilaterally left greater than  right. This does not contain fat or gas. This could be adherent bowel loops versus a pelvic mass. Similar changes seen in the right pelvis on the prior study but not in the left pelvis. Disc levels: L1-2: Negative L2-3: Negative L3-4: Mild disc bulging.  Negative for stenosis. L4-5: Mild disc bulging.  Negative for disc protrusion or stenosis. L5-S1: Negative IMPRESSION: Very mild lumbar degenerative change. Negative for neural impingement or stenosis. Lobular large areas of soft tissue density in the pelvis bilaterally which could be adherent bowel loops versus a large pelvic mass. Recommend CT abdomen pelvis with oral and IV contrast for further evaluation. Electronically Signed   By: Franchot Gallo M.D.   On: 11/25/2020 14:43   DG Chest Portable 1 View  Result Date: 11/24/2020 CLINICAL DATA:  Weakness with anterior chest wall pain. EXAM: PORTABLE CHEST 1 VIEW COMPARISON:  05/04/2019 FINDINGS: 1415 hours. The lungs are clear without focal pneumonia, edema, pneumothorax or pleural effusion. The cardiopericardial silhouette is within normal limits for size. Status post cardiac valve replacement. Telemetry leads overlie the chest. IMPRESSION: No active disease. Electronically Signed   By: Misty Stanley M.D.   On: 11/24/2020 14:53   US Abdomen Limited RUQ (LIVER/GB)  Result Date: 11/24/2020 CLINICAL DATA:  Elevated LFTs EXAM: ULTRASOUND ABDOMEN LIMITED RIGHT UPPER QUADRANT COMPARISON:  04/23/2020  FINDINGS: Gallbladder: Surgically removed. Common bile duct: Diameter: 4 mm Liver: Increased echogenicity is noted consistent with fatty infiltration. No focal mass is noted. Portal vein is patent on color Doppler imaging with normal direction of blood flow towards the liver. Other: None. IMPRESSION: Fatty liver. Status post cholecystectomy. Electronically Signed   By: Inez Catalina M.D.   On: 11/24/2020 16:29    Scheduled Meds: . sodium chloride   Intravenous Once  . folic acid  1 mg Oral Daily  . gabapentin  100 mg Oral Daily   And  . gabapentin  400 mg Oral QHS  . metoCLOPramide  10 mg Oral TID AC  . multivitamin with minerals  1 tablet Oral Daily  . pantoprazole  40 mg Oral BID  . potassium chloride  40 mEq Oral Q4H  . sodium chloride flush  10-40 mL Intracatheter Q12H  . thiamine  100 mg Oral Daily  . vitamin B-12  500 mcg Oral Daily   Continuous Infusions: . phytonadione (VITAMIN K) IV       LOS: 2 days   Time spent: 35-minute   Darliss Cheney, MD [facility]  11/26/2020, 1:24 PM   To contact the attending provider between 7A-7P or the covering provider during after hours 7P-7A, please log into the web site [email or web address].

## 2020-11-26 NOTE — Consult Note (Signed)
Cardiology Consultation:   Patient ID: Mary [name] MRN: [id number]; DOB: 11-Dec-1960  Admit date: 11/24/2020 Date of Consult: 11/26/2020  PCP:  [name], MD   [place]  Cardiologist:  Candee Furbish, MD   Patient Profile:   Mary [name] is a 60 y.o. 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 Doristine Bosworth.  History of Present Illness:   Mary [name] has a history of rheumatic mitral valve disease and is s/p mechanical mitral valve and TV repair June 2014. She had transient CHB and TIA in 05/2016 in the setting of subtherapeutic INR, macrocytic anemia due to B12 deficiency. She was hospitalized with fever, cough and new murmur in 2014 initially felt to be bacterial endocarditis. However, TEE with findings consistent with rheumatic mitral valve disease. Preop cath showed no significant CAD. Post op complicated by transient heart block and brief Afib. Conduction recovered and she did not require PPM.   In July 2019, she has symptomatic intermittent CHB and PPM was recommended; however, she did not follow up and schedule. She was admitted 03/2019 with syncope, found to have anemia with Hb 7.8 felt to be due to B12 deficiency. ILR was recommended by Dr. Lovena Le, but insurance would not approve this. Pt would not complete patient assistance. She has had some problems with compliance with coumadin and missed follow ups. In the past, SW helped arrange transportation. Sinus tach has been managed conservatively given history of heart block. At outpatient visit 11/2019 she complained of fatigue in the setting of tachycardia and hypotension. Na was 122 and Hb 8.5.  Staff unable to reach her to advise ED evaluation.   She was hospitalized 12/2019 for weakness, vomiting, and poor oral intake for 3 days. Found to have anemia with Hb trending down to 7.3 and worsening AKI and lactic acidosis.  Echo at that time with good valve function. After discharge, she did call the office with dark stools and was advised to go to the ER, but declined. She canceled endoscopies scheduled with GI. She was admitted again Aug 2021 with anemia felt secondary to acute gastritis. Pyloric stenosis found on endoscopy with GI, felt due to prior NSAID and ASA use. She was admitted Nov 2021 with progressive neuropathy/myopathy felt potentially related to alcoholism and gabapentin. Anemia and AKI again noted. "CT head was unremarkable, MRI noted few small microhemorrhages which was not felt to be significant,spinal tap also completed in radiology on 11/24CSF preliminary studies are all unremarkable. Rheumatoid factor, ACE levels and heavy metal screen are all normal. Vitamin B12 was normal,Vitamin B1, B6, methylmalonic acid levelare pending, SPEP is normal."   She was last seen in clinic 08/28/20 by [facility] with no cardiac complaints. She continued to have generalized weakness and neuropathy.   She presented with nausea, vomiting, diarrhea, and hematemesis and INR of 8.2. She was evaluated and there is suspicion for a mallory-weiss from repeated vomiting and retching. Hb dropped from 9.9 to 6.4 this morning. She is currently receiving 1U PRBC. She has had no further hematemesis since admission. INR yesterday was 2.5, not checked today. Cardiology was consulted for recommendations for anticoagulation given drop in hemoglobin. She denies recent syncope, but was weak  prior to admission.   Past Medical History:  Diagnosis Date  . Abnormal liver function   . Acute diastolic heart failure ([place]) 03/15/2013  . Alcoholism ([place])   . Anemia   . B12 deficiency   . Carotid stenosis    Carotid  US 2/17: bilateral ICA 1-39% >> FU prn  . Childhood asthma   . Chronic diastolic CHF (congestive heart failure) ([place])   . Complete heart block ([place])   . Epigastric hernia 200's  . GERD (gastroesophageal reflux disease)   . History of blood transfusion    "14 w/1st pregnancy; 2 w/last C-section" (03/15/2013)  . Hx of echocardiogram    a. Echo 4/16:  Mild focal basal septal hypertrophy, EF 60-65%, no RWMA, Mechanical MVR ok with mild central regurgitation and no perivalvular leak, small mobile density attached to valvular ring (1.5x1 cm) - post surgical changes vs SBE, mild LAE;   b. Echo 2/17:  Mild post wall LVH, EF 55-60%, no RWMA, mechanical MVR ok (mean 5 mmHg), normal RVSF  . Hypertension    no pcp   will go to [facility]  . Macrocytic anemia   . Protein calorie malnutrition ([place])   . 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 S/P mitral valve replacement with metallic valve 7/82/9562   87mm Sorin Carbomedics mechanical prosthesis via right mini thoracotomy approach  . 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   Complex valvuloplasty including Cor-matrix ECM patch augmentation of anterior and lateral leaflets with 57mm Edwards mc3 ring annuloplasty via right mini-thoracotomy approach  . Severe mitral regurgitation 04/22/2013  . Sinus tachycardia   . SVT (supraventricular tachycardia) ([place]) 03/16/2013  . TIA (transient ischemic attack)   . Tricuspid regurgitation     Past Surgical History:  Procedure Laterality Date  . APPENDECTOMY  [id number]  . BIOPSY  05/01/2020   Procedure: BIOPSY;  Surgeon: Milus Banister, MD;  Location: [place];  Service: Endoscopy;;  . CARDIAC CATHETERIZATION    . CARDIAC VALVE REPLACEMENT    . CESAREAN SECTION  1983  . CESAREAN SECTION WITH BILATERAL TUBAL LIGATION  1999  . COLONOSCOPY WITH PROPOFOL N/A 05/01/2020   Procedure: COLONOSCOPY WITH PROPOFOL;  Surgeon: Milus Banister, MD;  Location[facility] ENDOSCOPY;  Service: Endoscopy;  Laterality: N/A;  .  ESOPHAGOGASTRODUODENOSCOPY (EGD) WITH PROPOFOL N/A 05/01/2020   Procedure: ESOPHAGOGASTRODUODENOSCOPY (EGD) WITH PROPOFOL;  Surgeon: Milus Banister, MD;  Location: [facility] ENDOSCOPY;  Service: Endoscopy;  Laterality: N/A;  . FEMORAL HERNIA REPAIR Right 05/23/2013   Procedure: HERNIA REPAIR FEMORAL;  Surgeon: Rexene Alberts, MD;  Location: [place];  Service: Open Heart Surgery;  Laterality: Right;  . INTRAOPERATIVE TRANSESOPHAGEAL ECHOCARDIOGRAM N/A 05/23/2013   Procedure: INTRAOPERATIVE TRANSESOPHAGEAL ECHOCARDIOGRAM;  Surgeon: Rexene Alberts, MD;  Location: [place];  Service: Open Heart Surgery;  Laterality: N/A;  . LAPAROSCOPIC CHOLECYSTECTOMY  2003  . LEFT AND RIGHT HEART CATHETERIZATION WITH CORONARY ANGIOGRAM N/A 03/22/2013   Procedure: LEFT AND RIGHT HEART CATHETERIZATION WITH CORONARY ANGIOGRAM;  Surgeon: Burnell Blanks, MD;  Location[facility] CATH LAB;  Service: Cardiovascular;  Laterality: N/A;  . MINIMALLY INVASIVE TRICUSPID VALVE REPAIR Right 05/23/2013   Procedure: MINIMALLY INVASIVE TRICUSPID VALVE REPAIR;  Surgeon: Rexene Alberts, MD;  Location: [place];  Service: Open Heart Surgery;  Laterality: Right;  . MITRAL VALVE REPLACEMENT N/A 05/23/2013   Procedure: MITRAL VALVE (MV) REPLACEMENT;  Surgeon: Rexene Alberts, MD;  Location: [place];  Service: Open Heart Surgery;  Laterality: N/A;  . MULTIPLE EXTRACTIONS  WITH ALVEOLOPLASTY N/A 04/04/2013   Procedure: Extraction of tooth #'s 1,8,9,13,14,15,23,24,25,26 with alveoloplasty and gross debridement of remaining teeth;  Surgeon: Lenn Cal, DDS;  Location: [place];  Service: Oral Surgery;  Laterality: N/A;  . TEE WITHOUT CARDIOVERSION N/A 03/18/2013   Procedure: TRANSESOPHAGEAL ECHOCARDIOGRAM (TEE);  Surgeon: Larey Dresser, MD;  Location: [place];  Service: Cardiovascular;  Laterality: N/A;  . TEE WITHOUT CARDIOVERSION N/A 06/17/2013   Procedure: TRANSESOPHAGEAL ECHOCARDIOGRAM (TEE);  Surgeon: Lelon Perla, MD;  Location: [facility] ENDOSCOPY;   Service: Cardiovascular;  Laterality: N/A;  . TUBAL LIGATION  1999     Home Medications:  Prior to Admission medications   Medication Sig Start Date End Date Taking? Authorizing Provider  acetaminophen (TYLENOL) 500 MG tablet Take 1 tablet (500 mg total) by mouth every 6 (six) hours as needed for mild pain or headache. 08/24/20  Yes Domenic Polite, MD  Cyanocobalamin (VITAMIN B-12) 500 MCG TABS Take 500 mcg by mouth daily. 08/25/20  Yes Domenic Polite, MD  folic acid (FOLVITE) 1 MG tablet Take 1 tablet (1 mg total) by mouth daily. 10/06/20 01/04/21 Yes [name], MD  gabapentin (NEURONTIN) 100 MG capsule Take 1 capsule (100 mg total) by mouth 2 (two) times daily. And continue 300 mg at bedtime 08/27/20  Yes Mayers, Cari S, PA-C  gabapentin (NEURONTIN) 300 MG capsule Take 1 capsule (300 mg total) by mouth at bedtime. 10/06/20  Yes [name], MD  pantoprazole (PROTONIX) 40 MG tablet Take 1 tablet (40 mg total) by mouth daily. 08/27/20 11/25/20 Yes Mayers, Cari S, PA-C  warfarin (COUMADIN) 5 MG tablet Take 2.5mg  to 5mg  daily or as directed by Coumadin clinic Patient taking differently: Take 2.5-5 mg by mouth See admin instructions. Take 2.5mg   tablet by mouth on Tuesdays,Thursdays,Saturdays, Sundays,then take 5mg  by mouth all other days. 11/17/20  Yes Evans Lance, MD    Inpatient Medications: Scheduled Meds: . sodium chloride   Intravenous Once  . folic acid  1 mg Oral Daily  . gabapentin  100 mg Oral Daily   And  . gabapentin  400 mg Oral QHS  . metoCLOPramide  10 mg Oral TID AC  . multivitamin with minerals  1 tablet Oral Daily  . pantoprazole  40 mg Oral BID  . potassium chloride  40 mEq Oral Q4H  . sodium chloride flush  10-40 mL Intracatheter Q12H  . thiamine  100 mg Oral Daily  . vitamin B-12  500 mcg Oral Daily   Continuous Infusions: . magnesium sulfate bolus IVPB 2 g (11/26/20 1432)  . phytonadione (VITAMIN K) IV     PRN Meds: acetaminophen, HYDROmorphone  (DILAUDID) injection, LORazepam **OR** LORazepam, sodium chloride flush  Allergies:    Allergies  Allergen Reactions  . Oxycodone Itching  . Tape Rash and Other (See Comments)    THE ONLY TAPE THAT IS TOLERATED IS PAPER TAPE. EKG leads inflame the skin    Social History:   Social History   Socioeconomic History  . Marital status: Single    Spouse name: Not on file  . Number of children: Not on file  . Years of education: Not on file  . Highest education level: Not on file  Occupational History  . Not on file  Tobacco Use  . Smoking status: Former Smoker    Packs/day: 0.50    Years: 36.00    Pack years: 18.00    Types: Cigarettes    Quit date: 03/15/2013  Years since quitting: 7.7  . Smokeless tobacco: Never Used  Vaping Use  . Vaping Use: Never used  Substance and Sexual Activity  . Alcohol use: Yes    Comment:  "glass of wine"  . Drug use: No  . Sexual activity: Never  Other Topics Concern  . Not on file  Social History Narrative  . Not on file   Social Determinants of Health   Financial Resource Strain: Not on file  Food Insecurity: Not on file  Transportation Needs: Not on file  Physical Activity: Not on file  Stress: Not on file  Social Connections: Not on file  Intimate Partner Violence: Not on file    Family History:    Family History  Problem Relation Age of Onset  . Stroke Father   . Cancer Father   . Sarcoidosis Sister   . Heart attack Neg Hx   . Colon cancer Neg Hx   . Stomach cancer Neg Hx   . Pancreatic cancer Neg Hx   . Esophageal cancer Neg Hx      ROS:  Please see the history of present illness.   All other ROS reviewed and negative.     Physical Exam/Data:   Vitals:   11/25/20 1603 11/25/20 1959 11/26/20 0504 11/26/20 1359  BP: 109/62 (!) 98/59 104/68 119/85  Pulse: 89 99 (!) 115 94  Resp: 20 18 18 18   Temp: 98.9 F (37.2 C) 98.5 F (36.9 C) 98.2 F (36.8 C) (!) 97.3 F (36.3 C)  TempSrc:  Oral Oral   SpO2: 100% 91%  100% 100%  Weight:      Height:        Intake/Output Summary (Last 24 hours) at 11/26/2020 1507 Last data filed at 11/26/2020 0318 Gross per 24 hour  Intake --  Output 1 ml  Net -1 ml   Last 3 Weights 11/24/2020 09/01/2020 08/28/2020  Weight (lbs) 121 lb 120 lb 6.4 oz 125 lb  Weight (kg) 54.885 kg 54.613 kg 56.7 kg     Body mass index is 22.13 kg/m.  General:   HEENT: normal Neck: no JVD Cardiac:  RRR, crisp valve click Lungs:  clear to auscultation bilaterally, no wheezing, rhonchi or rales  Abd: soft, nontender, no hepatomegaly  Ext: no edema Musculoskeletal:  No deformities, BUE and BLE strength normal and equal Skin: warm and dry  Neuro:  Speech slowed at times Psych:  Normal affect   EKG:  The EKG was personally reviewed and demonstrates:  Sinus tachycardia HR 119, anterior ST changes, will repeat EKG Telemetry:  Telemetry was personally reviewed and demonstrates:  Sinus to sinus tachycardia with HR 90-110s  Relevant CV Studies:  Echo 12/31/19: 1. Left ventricular ejection fraction, by estimation, is 55 to 60%. The  left ventricle has normal function. The left ventricle has no regional  wall motion abnormalities. Left ventricular diastolic function could not  be evaluated.  2. Right ventricular systolic function is normal. The right ventricular  size is normal.  3. The mitral valve has been repaired/replaced. No evidence of mitral  valve regurgitation. There is a mechanical valve present in the mitral  position.  4. The tricuspid valve is has been repaired/replaced. The tricuspid valve  is status post repair with an annuloplasty ring.  5. The aortic valve is normal in structure. Aortic valve regurgitation is  trivial. No aortic stenosis is present.   Laboratory Data:  High Sensitivity Troponin:   Recent Labs  Lab 11/24/20 1145 11/24/20 1421  TROPONINIHS 11 10     Chemistry Recent Labs  Lab 11/24/20 1656 11/25/20 0835 11/25/20 2347  NA 143 142 140  K  3.0* 3.3* 2.6*  CL 113* 115* 117*  CO2 17* 17* 16*  GLUCOSE 77 83 70  BUN 6 7 5*  CREATININE 1.08* 0.89 0.75  CALCIUM 8.4* 7.8* 6.9*  GFRNONAA 60* >60 >60  ANIONGAP 13 10 7     Recent Labs  Lab 11/24/20 1656 11/25/20 0835 11/25/20 2347  PROT 5.7* 4.7* 3.7*  ALBUMIN 2.5* 2.1* 1.6*  AST 214* 97* 38  ALT 99* 74* 45*  ALKPHOS 174* 146* 97  BILITOT 0.9 0.8 0.9   Hematology Recent Labs  Lab 11/24/20 2218 11/25/20 0835 11/25/20 2347  WBC 8.2 6.0 4.5  RBC 1.82* 1.89* 1.37*  HGB 7.9* 8.0* 6.4*  HCT 21.8* 22.2* 16.3*  MCV 119.8* 117.5* 119.0*  MCH 43.4* 42.3* 46.7*  MCHC 36.2* 36.0 39.3*  RDW 19.3* 18.8* 18.3*  PLT 283 290 233   BNPNo results for input(s): BNP, PROBNP in the last 168 hours.  DDimer No results for input(s): DDIMER in the last 168 hours.   Radiology/Studies:  CT HEAD WO CONTRAST  Result Date: 11/24/2020 CLINICAL DATA:  Mental status change. EXAM: CT HEAD WITHOUT CONTRAST TECHNIQUE: Contiguous axial images were obtained from the base of the skull through the vertex without intravenous contrast. COMPARISON:  08/14/2020 FINDINGS: Brain: Mild generalized atrophy. Negative for hydrocephalus. Negative for acute infarct, hemorrhage, mass. Vascular: Negative for hyperdense vessel Skull: Negative Sinuses/Orbits: Visualized paranasal sinuses clear. Orbits not included on the study Other: None IMPRESSION: Generalized atrophy.  No acute abnormality and no interval change. Electronically Signed   By: Franchot Gallo M.D.   On: 11/24/2020 13:40   CT LUMBAR SPINE WO CONTRAST  Result Date: 11/25/2020 CLINICAL DATA:  Low back pain with right leg pain. EXAM: CT LUMBAR SPINE WITHOUT CONTRAST TECHNIQUE: Multidetector CT imaging of the lumbar spine was performed without intravenous contrast administration. Multiplanar CT image reconstructions were also generated. COMPARISON:  CT abdomen pelvis 04/23/2020 FINDINGS: Segmentation: Normal Alignment: Normal Vertebrae: Negative for fracture or  mass Paraspinal and other soft tissues: Negative for paraspinous mass or adenopathy. Surgical clips in the gallbladder fossa. Large area of lobular soft tissue density in the pelvis bilaterally left greater than right. This does not contain fat or gas. This could be adherent bowel loops versus a pelvic mass. Similar changes seen in the right pelvis on the prior study but not in the left pelvis. Disc levels: L1-2: Negative L2-3: Negative L3-4: Mild disc bulging.  Negative for stenosis. L4-5: Mild disc bulging.  Negative for disc protrusion or stenosis. L5-S1: Negative IMPRESSION: Very mild lumbar degenerative change. Negative for neural impingement or stenosis. Lobular large areas of soft tissue density in the pelvis bilaterally which could be adherent bowel loops versus a large pelvic mass. Recommend CT abdomen pelvis with oral and IV contrast for further evaluation. Electronically Signed   By: Franchot Gallo M.D.   On: 11/25/2020 14:43   DG Chest Portable 1 View  Result Date: 11/24/2020 CLINICAL DATA:  Weakness with anterior chest wall pain. EXAM: PORTABLE CHEST 1 VIEW COMPARISON:  05/04/2019 FINDINGS: 1415 hours. The lungs are clear without focal pneumonia, edema, pneumothorax or pleural effusion. The cardiopericardial silhouette is within normal limits for size. Status post cardiac valve replacement. Telemetry leads overlie the chest. IMPRESSION: No active disease. Electronically Signed   By: Misty Stanley M.D.   On: 11/24/2020 14:53   US  Abdomen Limited RUQ (LIVER/GB)  Result Date: 11/24/2020 CLINICAL DATA:  Elevated LFTs EXAM: ULTRASOUND ABDOMEN LIMITED RIGHT UPPER QUADRANT COMPARISON:  04/23/2020 FINDINGS: Gallbladder: Surgically removed. Common bile duct: Diameter: 4 mm Liver: Increased echogenicity is noted consistent with fatty infiltration. No focal mass is noted. Portal vein is patent on color Doppler imaging with normal direction of blood flow towards the liver. Other: None. IMPRESSION: Fatty  liver. Status post cholecystectomy. Electronically Signed   By: Inez Catalina M.D.   On: 11/24/2020 16:29     Assessment and Plan:   Mechanical mitral valve Need for anticoagulation - INR target 2.5-3.5 - pt has received vitamin K - given mechanical valve and INR of 2.5 yesterday, recommend rechecking an INR with post-transfusion CBC - if under 2.5, restart heparin drip   Acute on chronic anemia - pt denies frank bleeding - receiving 1 U PRBC - has chronic macrocytic anemia   History of alcoholism - states she has not drank alcohol since Nov 2021   Hx of TIA  - in the setting of subtherapeutic INR   Paroxysmal atrial tachycardia Hx of intermittent CHB Hx of syncope - BB used on PRN bases if needed in the hospital - telemetry with sinus to sinus tachycardia     Risk Assessment/Risk Scores:       For questions or updates, please contact [facility] Please consult [email or web address] for contact info under    Signed, Ledora Bottcher, [place]  11/26/2020 3:07 PM

## 2020-11-26 NOTE — Progress Notes (Signed)
Type and screen, 1 unit PRBCs ordered by MD. Type and screen completed and consent form signed @0625  and can be found in pts chart.

## 2020-11-26 NOTE — Progress Notes (Signed)
Floor coverage overnight event  Hemoglobin down to 6.4, was 8.0 on labs done yesterday morning.  No episodes of hematemesis, melena, or hematochezia overnight per RN.  Type and screen, 1 unit PRBCs ordered after obtaining verbal consent from the patient.  Follow-up posttransfusion H&H.

## 2020-11-27 ENCOUNTER — Encounter (HOSPITAL_COMMUNITY): Payer: Self-pay | Admitting: Internal Medicine

## 2020-11-27 ENCOUNTER — Inpatient Hospital Stay (HOSPITAL_COMMUNITY): Payer: Medicaid Other | Admitting: Anesthesiology

## 2020-11-27 ENCOUNTER — Ambulatory Visit: Payer: Self-pay | Admitting: Cardiology

## 2020-11-27 ENCOUNTER — Telehealth: Payer: Self-pay | Admitting: Internal Medicine

## 2020-11-27 ENCOUNTER — Encounter (HOSPITAL_COMMUNITY)
Admission: EM | Disposition: A | Discharge status: Home Health | Payer: Self-pay | Source: Home / Self Care | Attending: Family Medicine

## 2020-11-27 DIAGNOSIS — K311 Adult hypertrophic pyloric stenosis: Secondary | ICD-10-CM

## 2020-11-27 HISTORY — PX: BALLOON DILATION: SHX5330

## 2020-11-27 HISTORY — PX: ESOPHAGOGASTRODUODENOSCOPY (EGD) WITH PROPOFOL: SHX5813

## 2020-11-27 LAB — CBC WITH DIFFERENTIAL/PLATELET
Abs Immature Granulocytes: 0.03 10*3/uL (ref 0.00–0.07)
Basophils Absolute: 0 10*3/uL (ref 0.0–0.1)
Basophils Relative: 1 %
Eosinophils Absolute: 0.3 10*3/uL (ref 0.0–0.5)
Eosinophils Relative: 4 %
HCT: 26.3 % — ABNORMAL LOW (ref 36.0–46.0)
Hemoglobin: 9.2 g/dL — ABNORMAL LOW (ref 12.0–15.0)
Immature Granulocytes: 1 %
Lymphocytes Relative: 18 %
Lymphs Abs: 1.1 10*3/uL (ref 0.7–4.0)
MCH: 38 pg — ABNORMAL HIGH (ref 26.0–34.0)
MCHC: 35 g/dL (ref 30.0–36.0)
MCV: 108.7 fL — ABNORMAL HIGH (ref 80.0–100.0)
Monocytes Absolute: 0.6 10*3/uL (ref 0.1–1.0)
Monocytes Relative: 9 %
Neutro Abs: 4.4 10*3/uL (ref 1.7–7.7)
Neutrophils Relative %: 67 %
Platelets: 241 10*3/uL (ref 150–400)
RBC: 2.42 MIL/uL — ABNORMAL LOW (ref 3.87–5.11)
WBC: 6.4 10*3/uL (ref 4.0–10.5)
nRBC: 0 % (ref 0.0–0.2)

## 2020-11-27 LAB — COMPREHENSIVE METABOLIC PANEL
ALT: 48 U/L — ABNORMAL HIGH (ref 0–44)
AST: 35 U/L (ref 15–41)
Albumin: 2.3 g/dL — ABNORMAL LOW (ref 3.5–5.0)
Alkaline Phosphatase: 149 U/L — ABNORMAL HIGH (ref 38–126)
Anion gap: 6 (ref 5–15)
BUN: 6 mg/dL (ref 6–20)
CO2: 18 mmol/L — ABNORMAL LOW (ref 22–32)
Calcium: 8.5 mg/dL — ABNORMAL LOW (ref 8.9–10.3)
Chloride: 111 mmol/L (ref 98–111)
Creatinine, Ser: 1.13 mg/dL — ABNORMAL HIGH (ref 0.44–1.00)
GFR, Estimated: 56 mL/min — ABNORMAL LOW (ref >60)
Glucose, Bld: 97 mg/dL (ref 70–99)
Potassium: 5.1 mmol/L (ref 3.5–5.1)
Sodium: 135 mmol/L (ref 135–145)
Total Bilirubin: 1.2 mg/dL (ref 0.3–1.2)
Total Protein: 5.1 g/dL — ABNORMAL LOW (ref 6.5–8.1)

## 2020-11-27 LAB — PROTIME-INR
INR: 2.1 — ABNORMAL HIGH (ref 0.8–1.2)
INR: 1.8 — ABNORMAL HIGH (ref 0.8–1.2)
Prothrombin Time: 22.7 seconds — ABNORMAL HIGH (ref 11.4–15.2)
Prothrombin Time: 20.5 seconds — ABNORMAL HIGH (ref 11.4–15.2)

## 2020-11-27 LAB — MAGNESIUM: Magnesium: 1.9 mg/dL (ref 1.7–2.4)

## 2020-11-27 SURGERY — ESOPHAGOGASTRODUODENOSCOPY (EGD) WITH PROPOFOL
Anesthesia: Monitor Anesthesia Care

## 2020-11-27 MED ORDER — HEPARIN (PORCINE) 25000 UT/250ML-% IV SOLN
700.0000 [IU]/h | INTRAVENOUS | Status: DC
  Filled 2020-11-27: qty 250

## 2020-11-27 MED ORDER — SODIUM CHLORIDE 0.9 % IV SOLN: INTRAVENOUS | Status: DC

## 2020-11-27 MED ORDER — LACTATED RINGERS IV SOLN
INTRAVENOUS | Status: AC | PRN
Start: 2020-11-27 — End: 2020-11-27
  Administered 2020-11-27: 1000 mL via INTRAVENOUS

## 2020-11-27 MED ORDER — METOCLOPRAMIDE HCL 5 MG/ML IJ SOLN
10.0000 mg | Freq: Four times a day (QID) | INTRAMUSCULAR | Status: DC
  Administered 2020-11-27 – 2020-11-28 (×4): 10 mg via INTRAVENOUS
  Filled 2020-11-27 (×4): qty 2

## 2020-11-27 MED ORDER — PHENYLEPHRINE HCL-NACL 10-0.9 MG/250ML-% IV SOLN: INTRAVENOUS | Status: DC | PRN

## 2020-11-27 MED ORDER — PROPOFOL 500 MG/50ML IV EMUL
INTRAVENOUS | Status: DC | PRN
  Administered 2020-11-27: 125 ug/kg/min via INTRAVENOUS

## 2020-11-27 MED ORDER — PHENYLEPHRINE 40 MCG/ML (10ML) SYRINGE FOR IV PUSH (FOR BLOOD PRESSURE SUPPORT)
PREFILLED_SYRINGE | INTRAVENOUS | Status: DC | PRN
  Administered 2020-11-27: 80 ug via INTRAVENOUS

## 2020-11-27 MED ORDER — HEPARIN (PORCINE) 25000 UT/250ML-% IV SOLN
700.0000 [IU]/h | INTRAVENOUS | Status: AC
  Filled 2020-11-27: qty 250

## 2020-11-27 MED ORDER — WARFARIN - PHARMACIST DOSING INPATIENT: Freq: Every day | Status: DC

## 2020-11-27 MED ORDER — LIDOCAINE 2% (20 MG/ML) 5 ML SYRINGE
INTRAMUSCULAR | Status: DC | PRN
  Administered 2020-11-27: 30 mg via INTRAVENOUS

## 2020-11-27 MED ORDER — HEPARIN (PORCINE) 25000 UT/250ML-% IV SOLN
700.0000 [IU]/h | INTRAVENOUS | Status: DC
  Administered 2020-11-27: 700 [IU]/h via INTRAVENOUS
  Filled 2020-11-27: qty 250

## 2020-11-27 MED ORDER — WARFARIN SODIUM 7.5 MG PO TABS
7.5000 mg | ORAL_TABLET | Freq: Once | ORAL | Status: AC
  Administered 2020-11-27: 7.5 mg via ORAL
  Filled 2020-11-27: qty 1

## 2020-11-27 SURGICAL SUPPLY — 15 items

## 2020-11-27 NOTE — Transfer of Care (Signed)
Immediate Anesthesia Transfer of Care Note  Patient: Mary [name]  Procedure(s) Performed: ESOPHAGOGASTRODUODENOSCOPY (EGD) WITH PROPOFOL (N/A ) BALLOON DILATION (N/A )  Patient Location: Endoscopy Unit  Anesthesia Type:MAC  Level of Consciousness: awake, alert , oriented and patient cooperative  Airway & Oxygen Therapy: Patient Spontanous Breathing  Post-op Assessment: Report given to RN and Post -op Vital signs reviewed and stable  Post vital signs: Reviewed and stable  Last Vitals:  Vitals Value Taken Time  BP 112/56 11/27/20 1155  Temp 36.6 C 11/27/20 1155  Pulse 95 11/27/20 1155  Resp 13 11/27/20 1157  SpO2 100 % 11/27/20 1155  Vitals shown include unvalidated device data.  Last Pain:  Vitals:   11/27/20 1155  TempSrc: Axillary  PainSc:       Patients Stated Pain Goal: 3 (09/81/19 1478)  Complications: No complications documented.

## 2020-11-27 NOTE — Anesthesia Procedure Notes (Signed)
Procedure Name: MAC Date/Time: 11/27/2020 11:30 AM Performed by: Reece Agar, CRNA Pre-anesthesia Checklist: Patient identified, Emergency Drugs available, Suction available, Patient being monitored and Timeout performed Patient Re-evaluated:Patient Re-evaluated prior to induction Oxygen Delivery Method: Simple face mask

## 2020-11-27 NOTE — Op Note (Signed)
[facility] Patient Name: Mary [name] Procedure Date : 11/27/2020 MRN: [id number] Attending MD: Gatha Mayer , MD Date of Birth: [birth year] CSN: [id number] Age: 60 Admit Type: Inpatient Procedure:                Upper GI endoscopy Indications:              Hematemesis, Pyloric stenosis, For therapy of                            pyloric stenosis Providers:                Gatha Mayer, MD, Erenest Rasher, RN, Fransico Setters                            [place], Technician Referring MD:              Medicines:                Propofol per Anesthesia, Monitored Anesthesia Care Complications:            No immediate complications. Estimated Blood Loss:     Estimated blood loss was minimal. Procedure:                Pre-Anesthesia Assessment:                           - Prior to the procedure, a History and Physical                            was performed, and patient medications and                            allergies were reviewed. The patient's tolerance of                            previous anesthesia was also reviewed. The risks                            and benefits of the procedure and the sedation                            options and risks were discussed with the patient.                            All questions were answered, and informed consent                            was obtained. Prior Anticoagulants: The patient                            last took Coumadin (warfarin) 3 days prior to the                            procedure and last took heparin on the day of the  procedure. ASA Grade Assessment: III - A patient                            with severe systemic disease. After reviewing the                            risks and benefits, the patient was deemed in                            satisfactory condition to undergo the procedure.                           After obtaining informed consent, the endoscope was                             passed under direct vision. Throughout the                            procedure, the patient's blood pressure, pulse, and                            oxygen saturations were monitored continuously. The                            [id number]-H190 ([id number]) Olympus gastroscope was                            introduced through the mouth, and advanced to the                            second part of duodenum. The [id number]-[id number] ([id number])                            Olympus slim endoscope was introduced through the                            and advanced to the. The upper GI endoscopy was                            accomplished without difficulty. The patient                            tolerated the procedure well. Scope In: Scope Out: Findings:      A benign-appearing, intrinsic severe stenosis was found at the pylorus.       This was traversed. A TTS dilator was passed through the scope. Dilation       with an 05-04-09 mm pyloric balloon dilator was performed. The dilation       site was examined and showed moderate mucosal disruption. Estimated       blood loss was minimal. Ultraslim gastroscope still required to pass       through even after dilation to 10 mm      The gastroesophageal flap valve was visualized endoscopically and       classified as Hill Grade  III (minimal fold, loose to endoscope, hiatal       hernia likely).      The exam was otherwise without abnormality.      The cardia and gastric fundus were normal on retroflexion. Impression:               - Gastric stenosis was found at the pylorus.                            Dilated. 10 mm max                           - Gastroesophageal flap valve classified as Hill                            Grade III (minimal fold, loose to endoscope, hiatal                            hernia likely).                           - The examination was otherwise normal.                           - No specimens collected. Recommendation:           - Return  patient to hospital ward for ongoing care.                           - Clear liquid diet.                           - Resume heparin at prior dose today.                           - OK to restart heparin now                           Stay on clear liquids for now - we will recheck                            tomorrow                           would wait until tomorrow to start warfarin to                            allow obserbvation period after dilation                           She may need another dilation in future but would                            observe for clinical response - given MVR and high                            risk for stroke  off anti-coag she is complicated as                            far as scheduling and performing dilation Procedure Code(s):        --- Professional ---                           [phone], Esophagogastroduodenoscopy, flexible,                            transoral; with dilation of gastric/duodenal                            stricture(s) (eg, balloon, bougie) Diagnosis Code(s):        --- Professional ---                           K31.1, Adult hypertrophic pyloric stenosis                           K92.0, Hematemesis CPT copyright 2019 American Medical Association. All rights reserved. The codes documented in this report are preliminary and upon coder review may  be revised to meet current compliance requirements. Gatha Mayer, MD 11/27/2020 12:04:47 PM This report has been signed electronically. Number of Addenda: 0

## 2020-11-27 NOTE — Progress Notes (Signed)
PROGRESS NOTE    Mary [name]  OZH:[id number] DOB: Feb 06, 1961 DOA: 11/24/2020 PCP: [name], MD   Brief Narrative:  Mary [name] is a 60 y.o. 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 No abdominal pain, and she also has had a loose bowel movement for 5-day. She admitted to been using marijuana.  Upon arrival to ED, she was hemodynamically stable.  CT head was unremarkable. She had elevated LFTs for which ultrasound abdomen was done which showed fatty liver similar to last year.  She had mild AKI as well as supratherapeutic INR of 8.2.  She received 2.5 mg of oral vitamin K.  GI was consulted.   Assessment & Plan:   Active Problems:   Elevated INR   Vomiting   GI bleed   Acquired pyloric stenosis  Nausea and vomiting/upper GI bleed/hematemesis/acute blood loss anemia/pyloric stenosis: Hemoglobin dropped from 9.9 at the time of admission to 6.4 on 11/26/2020 for which patient received 1 unit of PRBC transfusion.  She has not had any further hematemesis.  Status post EGD today 11/27/2020 which shows pyloric stenosis.  This was dilated by GI.  No other source of bleeding.  Morning CBC is still pending.  I have asked RN to make sure CBC is drawn.  Continue twice daily Protonix and monitor H&H every 12 hours.  Start on clears and advance slowly as GI recommended.  No nausea or vomiting.  Continue as needed treatment.  History of mechanical mitral and tricuspid valve: Due to significant cardiac history and the need for keeping her INR in therapeutic range and now that it is highly contraindicated due to active GI bleeding, cardiology was consulted.  They have recommended that patient must be anticoagulated unless she has life-threatening hemorrhage and  recommended to start her on heparin if her INR falls less than 2.5.  Her INR is 2.1 today.  Heparin started this morning which was held for EGD.  Will resume now.  We will also start on Coumadin.  Severe hypokalemia: Resolved.  Hypomagnesemia: Resolved.  Hypophosphatemia: Resolved.  Mild AKI: Initially resolved but creatinine up again.  Continue to hold diuretics.  History of mechanical mitral valve replacement/elevated INR/Coumadin induced coagulopathy: Presented with INR of 8.2, received oral vitamin K 2.5 followed by another 5 mg on 11/25/2020.  INR 2.5.  INR subtherapeutic.  Starting on heparin drip.  Elevated LFTs: -Elevated AST/ALT ratio to implying active drinking.  However patient denied. -RUQ ultrasound showed fatty liver similar as last year.  LFTs improving.  No abdominal pain or tenderness.  History of pyloric stenosis -As above.  History of alcohol abuse -No symptoms signs of acute withdrawal, CIWA protocol with as needed benzos.  Alcohol level within normal range.  -Diastolic CHF chronic: Looks euvolemic.  Now recovering from AKI.  Hold diuretic for now.  Reassess tomorrow.  History of stroke: Patient tells me that she had a stroke during Thanksgiving 2021 and since then she has been having slurred speech.  Marijuana abuse: Counseled.  Right lower extremity weakness: Patient complained of right lower extremity weakness since about 1 to 2 weeks.  She also complains of low back pain which also started about 2 weeks ago.  She tells me that she had lumbar puncture done during Thanksgiving when she had stroke.  Her history is very  vague.  On examination, she does have positive straight leg raise test.  CT lumbar spine did not show any acute pathology however it shows some suspicion of possible [place] mass and radiology recommends CT abdomen and pelvis with IV and oral contrast which was done and ruled out any mass.  I wonder if she has been having this weakness since her stroke  6 months ago.  Her history is very vague.  No other etiology of sudden right lower extremity is found.  DVT prophylaxis:    Code Status: Full Code  Family Communication:  None present at bedside.  Plan of care discussed with patient in length and he verbalized understanding and agreed with it.  Status is: Inpatient  Remains inpatient appropriate because:Ongoing diagnostic testing needed not appropriate for outpatient work up   Dispo: The patient is from: Home              Anticipated d/c is to: Home              Patient currently is not medically stable to d/c.   Difficult to place patient No        Estimated body mass index is 22.13 kg/m as calculated from the following:   Height as of this encounter: 5\' 2"  (1.575 m).   Weight as of this encounter: 54.9 kg.      Nutritional status:               Consultants:   GI  Procedures:   None  Antimicrobials:  Anti-infectives (From admission, onward)   None         Subjective: Seen and examined this morning.  Feels better.  No complaints.  Objective: Vitals:   11/27/20 1048 11/27/20 1155 11/27/20 1205 11/27/20 1210  BP: 139/74 (!) 112/56 127/64 137/80  Pulse: 88 95 86 89  Resp: 13 10 17 12   Temp: 98.1 F (36.7 C) 97.8 F (36.6 C)    TempSrc: Oral Axillary    SpO2: 100% 100% 99% 100%  Weight:      Height:        Intake/Output Summary (Last 24 hours) at 11/27/2020 1309 Last data filed at 11/27/2020 1149 Gross per 24 hour  Intake 200 ml  Output 250 ml  Net -50 ml   Filed Weights   11/24/20 1144  Weight: 54.9 kg    Examination:  General exam: Appears calm and comfortable  Respiratory system: Clear to auscultation. Respiratory effort normal. Cardiovascular system: S1 & S2 heard, RRR. No JVD, murmurs, rubs, gallops or clicks. No pedal edema. Gastrointestinal system: Abdomen is nondistended, soft and nontender. No organomegaly or masses felt. Normal bowel sounds heard. Central nervous system:  Alert and oriented.  Slurred speech as residual from previous stroke. Extremities: Slightly decreased strength in right lower extremity. Skin: No rashes, lesions or ulcers.  Psychiatry: Judgement and insight appear poor  Data Reviewed: I have personally reviewed following labs and imaging studies  CBC: Recent Labs  Lab 11/24/20 1145 11/24/20 2218 11/25/20 0835 11/25/20 2347 11/26/20 2108  WBC 7.6 8.2 6.0 4.5 6.2  NEUTROABS 5.6  --   --   --   --   HGB 9.9* 7.9* 8.0* 6.4* 9.6*  HCT 28.0* 21.8* 22.2* 16.3* 27.0*  MCV 120.7* 119.8* 117.5* 119.0* 109.3*  PLT 367 283 290 233 371   Basic Metabolic Panel: Recent Labs  Lab 11/24/20 1145 11/24/20 1656 11/25/20 0835 11/25/20 2347 11/27/20 0325  NA 143 143 142 140 135  K  3.5 3.0* 3.3* 2.6* 5.1  CL 113* 113* 115* 117* 111  CO2 17* 17* 17* 16* 18*  GLUCOSE 101* 77 83 70 97  BUN 8 6 7  5* 6  CREATININE 1.09* 1.08* 0.89 0.75 1.13*  CALCIUM 8.5* 8.4* 7.8* 6.9* 8.5*  MG  --  1.6* 1.4* 1.1* 1.9  PHOS  --  2.8 2.1*  --   --    GFR: Estimated Creatinine Clearance: 42.9 mL/min (A) (by C-G formula based on SCr of 1.13 mg/dL (H)). Liver Function Tests: Recent Labs  Lab 11/24/20 1145 11/24/20 1656 11/25/20 0835 11/25/20 2347 11/27/20 0325  AST 264* 214* 97* 38 35  ALT 109* 99* 74* 45* 48*  ALKPHOS 196* 174* 146* 97 149*  BILITOT 1.0 0.9 0.8 0.9 1.2  PROT 5.8* 5.7* 4.7* 3.7* 5.1*  ALBUMIN 2.5* 2.5* 2.1* 1.6* 2.3*   Recent Labs  Lab 11/24/20 1331  LIPASE 20   No results for input(s): AMMONIA in the last 168 hours. Coagulation Profile: Recent Labs  Lab 11/24/20 1145 11/25/20 0835 11/25/20 2347 11/27/20 0325  INR 8.2* 7.1* 2.5* 2.1*   Cardiac Enzymes: No results for input(s): CKTOTAL, CKMB, CKMBINDEX, TROPONINI in the last 168 hours. BNP (last 3 results) No results for input(s): PROBNP in the last 8760 hours. HbA1C: No results for input(s): HGBA1C in the last 72 hours. CBG: Recent Labs  Lab 11/24/20 1141  GLUCAP  102*   Lipid Profile: No results for input(s): CHOL, HDL, LDLCALC, TRIG, CHOLHDL, LDLDIRECT in the last 72 hours. Thyroid Function Tests: No results for input(s): TSH, T4TOTAL, FREET4, T3FREE, THYROIDAB in the last 72 hours. Anemia Panel: Recent Labs    11/24/20 1331  VITAMINB12 1,311*   Sepsis Labs: No results for input(s): PROCALCITON, LATICACIDVEN in the last 168 hours.  Recent Results (from the past 240 hour(s))  SARS CORONAVIRUS 2 (TAT 6-24 HRS) Nasopharyngeal Nasopharyngeal Swab     Status: None   Collection Time: 11/24/20 10:18 PM   Specimen: Nasopharyngeal Swab  Result Value Ref Range Status   SARS Coronavirus 2 NEGATIVE NEGATIVE Final    Comment: (NOTE) SARS-CoV-2 target nucleic acids are NOT DETECTED.  The SARS-CoV-2 RNA is generally detectable in upper and lower respiratory specimens during the acute phase of infection. Negative results do not preclude SARS-CoV-2 infection, do not rule out co-infections with other pathogens, and should not be used as the sole basis for treatment or other patient management decisions. Negative results must be combined with clinical observations, patient history, and epidemiological information. The expected result is Negative.  Fact Sheet for Patients: SugarRoll.be  Fact Sheet for Healthcare Providers: https://www.woods-mathews.com/  This test is not yet approved or cleared by the [place] FDA and  has been authorized for detection and/or diagnosis of SARS-CoV-2 by FDA under an Emergency Use Authorization (EUA). This EUA will remain  in effect (meaning this test can be used) for the duration of the COVID-19 declaration under Se ction 564(b)(1) of the Act, 21 U.S.C. section 360bbb-3(b)(1), unless the authorization is terminated or revoked sooner.  Performed at [facility], [place] [address], [place], [place] [address]       Radiology Studies: CT ABDOMEN PELVIS W  CONTRAST  Result Date: 11/26/2020 CLINICAL DATA:  Abdominal mass. Anemia and vomiting. CT of the lumbar spine demonstrated adherent bowel loops versus pelvic masses. EXAM: CT ABDOMEN AND PELVIS WITH CONTRAST TECHNIQUE: Multidetector CT imaging of the abdomen and pelvis was performed using the standard protocol following bolus administration of intravenous  contrast. CONTRAST:  149mL OMNIPAQUE IOHEXOL 300 MG/ML  SOLN COMPARISON:  CT of the lumbar spine on 11/25/2020 and prior CT of the abdomen and pelvis with contrast on 04/23/2020. FINDINGS: Lower chest: No acute abnormality. Hepatobiliary: There is evidence of diffuse hepatic steatosis. No overt cirrhotic morphology. No hepatic masses or biliary dilatation. The gallbladder has been removed. Pancreas: Unremarkable. No pancreatic ductal dilatation or surrounding inflammatory changes. Spleen: Normal in size without focal abnormality. Adrenals/Urinary Tract: Adrenal glands are unremarkable. Kidneys are normal, without renal calculi, focal lesion, or hydronephrosis. Bladder is unremarkable. Stomach/Bowel: Bowel shows no evidence of obstruction, ileus, inflammation or lesion. No free air identified. Vascular/Lymphatic: No significant vascular findings are present. No enlarged abdominal or pelvic lymph nodes. Reproductive: Uterus and bilateral adnexa are unremarkable. Small amount of free fluid in the pelvis. There is no evidence of pelvic mass with prior lumbar CT findings representing bowel loops as well as some adjacent free fluid. Other: Focal midline upper abdominal ventral hernia contains fat. No evidence of focal abscess. Musculoskeletal: No acute or significant osseous findings. IMPRESSION: 1. Hepatic steatosis. 2. Small amount of free fluid in the pelvis. 3. Focal midline upper abdominal ventral hernia containing fat. 4. No evidence of pelvic masses. Lumbar CT findings are secondary to unopacified bowel loops. Electronically Signed   By: Aletta Edouard M.D.    On: 11/26/2020 16:53    Scheduled Meds: . folic acid  1 mg Oral Daily  . gabapentin  100 mg Oral Daily   And  . gabapentin  400 mg Oral QHS  . metoCLOPramide (REGLAN) injection  10 mg Intravenous Q6H  . multivitamin with minerals  1 tablet Oral Daily  . pantoprazole  40 mg Oral BID  . sodium chloride flush  10-40 mL Intracatheter Q12H  . thiamine  100 mg Oral Daily  . vitamin B-12  500 mcg Oral Daily   Continuous Infusions: . phytonadione (VITAMIN K) IV       LOS: 3 days   Time spent: 30-minute   Darliss Cheney, MD [facility]  11/27/2020, 1:09 PM   To contact the attending provider between 7A-7P or the covering provider during after hours 7P-7A, please log into the web site [email or web address].

## 2020-11-27 NOTE — Care Management (Signed)
Consent for upper GI endoscopy complete.

## 2020-11-27 NOTE — Anesthesia Preprocedure Evaluation (Addendum)
Anesthesia Evaluation  Patient identified by MRN, date of birth, ID band Patient awake    Reviewed: Allergy & Precautions, NPO status , Patient's Chart, lab work & pertinent test results  History of Anesthesia Complications Negative for: history of anesthetic complications  Airway Mallampati: II  TM Distance: >3 FB Neck ROM: Full    Dental  (+) Missing,    Pulmonary former smoker,    Pulmonary exam normal        Cardiovascular hypertension, +CHF  Normal cardiovascular exam  TTE 12/2019: EF 55-60%, s/p MVR, s/p TVR     Neuro/Psych CVA (right leg weakness), Residual Symptoms negative psych ROS   GI/Hepatic GERD  Medicated and Controlled,(+)     substance abuse (last in 07/2020)  alcohol use, UGIB   Endo/Other  negative endocrine ROS  Renal/GU Renal disease (Cr 1.13)  negative genitourinary   Musculoskeletal negative musculoskeletal ROS (+)   Abdominal   Peds  Hematology  (+) anemia , Hgb 9.6, INR 2.1   Anesthesia Other Findings Day of surgery medications reviewed with patient.  Reproductive/Obstetrics negative OB ROS                            Anesthesia Physical Anesthesia Plan  ASA: III  Anesthesia Plan: MAC   Post-op Pain Management:    Induction:   PONV Risk Score and Plan: 2 and Treatment may vary due to age or medical condition and Propofol infusion  Airway Management Planned: Natural Airway and Nasal Cannula  Additional Equipment: None  Intra-op Plan:   Post-operative Plan:   Informed Consent: I have reviewed the patients History and Physical, chart, labs and discussed the procedure including the risks, benefits and alternatives for the proposed anesthesia with the patient or authorized representative who has indicated his/her understanding and acceptance.       Plan Discussed with: CRNA  Anesthesia Plan Comments:        Anesthesia Quick Evaluation

## 2020-11-27 NOTE — Progress Notes (Signed)
ANTICOAGULATION CONSULT NOTE - Follow Up Consult  Pharmacy Consult for Heparin Indication: mechanical valve, warfarin on hold  Allergies  Allergen Reactions  . Oxycodone Itching  . Tape Rash and Other (See Comments)    THE ONLY TAPE THAT IS TOLERATED IS PAPER TAPE. EKG leads inflame the skin    Patient Measurements: Height: 5\' 2"  (157.5 cm) Weight: 54.9 kg (121 lb) IBW/kg (Calculated) : 50.1  Vital Signs: Temp: 98.1 F (36.7 C) (03/03 2149) Temp Source: Oral (03/03 2149) BP: 120/81 (03/03 2149) Pulse Rate: 113 (03/03 2149)  Labs: Recent Labs    11/24/20 1145 11/24/20 1421 11/24/20 1656 11/25/20 0835 11/25/20 2347 11/26/20 2108 11/27/20 0325  HGB 9.9*  --    < > 8.0* 6.4* 9.6*  --   HCT 28.0*  --    < > 22.2* 16.3* 27.0*  --   PLT 367  --    < > 290 233 265  --   APTT 49*  --   --   --   --   --   --   LABPROT 66.0*  --   --  59.0* 26.4*  --  22.7*  INR 8.2*  --   --  7.1* 2.5*  --  2.1*  CREATININE 1.09*  --    < > 0.89 0.75  --  1.13*  TROPONINIHS 11 10  --   --   --   --   --    < > = values in this interval not displayed.    Estimated Creatinine Clearance: 42.9 mL/min (A) (by C-G formula based on SCr of 1.13 mg/dL (H)).  Assessment: 60 year old female who presented with hematemesis and supra-therapeutic INR after "loading" herself with warfarin."  Now to begin heparin for INR of 2.1 while awaiting EGD and further workup   Goal of Therapy:  Heparin level 0.3-0.5 units/ml Monitor platelets by anticoagulation protocol: Yes   Plan:  No heparin bolus Heparin drip at 700 units / hr 8 hour heparin level Daily heparin level, CBC  Thank you Anette Guarneri, PharmD  11/27/2020,8:19 AM

## 2020-11-27 NOTE — Interval H&P Note (Signed)
History and Physical Interval Note:  11/27/2020 11:32 AM  Mary [name]  has presented today for surgery, with the diagnosis of Hematemesis, pyloric stenosis.  The various methods of treatment have been discussed with the patient and family. After consideration of risks, benefits and other options for treatment, the patient has consented to  Procedure(s): ESOPHAGOGASTRODUODENOSCOPY (EGD) WITH PROPOFOL (N/A) as a surgical intervention.  The patient's history has been reviewed, patient examined, no change in status, stable for surgery.  I have reviewed the patient's chart and labs.  Questions were answered to the patient's satisfaction.     Silvano Rusk

## 2020-11-27 NOTE — Progress Notes (Addendum)
ANTICOAGULATION CONSULT NOTE - Follow Up Consult  Pharmacy Consult for Heparin / Warfarin Indication: mechanical valve  Allergies  Allergen Reactions  . Oxycodone Itching  . Tape Rash and Other (See Comments)    THE ONLY TAPE THAT IS TOLERATED IS PAPER TAPE. EKG leads inflame the skin    Patient Measurements: Height: 5\' 2"  (157.5 cm) Weight: 54.9 kg (121 lb) IBW/kg (Calculated) : 50.1  Vital Signs: Temp: 97.8 F (36.6 C) (03/04 1155) Temp Source: Axillary (03/04 1155) BP: 137/80 (03/04 1210) Pulse Rate: 89 (03/04 1210)  Labs: Recent Labs    11/24/20 1421 11/24/20 1656 11/25/20 0835 11/25/20 2347 11/26/20 2108 11/27/20 0325  HGB  --    < > 8.0* 6.4* 9.6*  --   HCT  --    < > 22.2* 16.3* 27.0*  --   PLT  --    < > 290 233 265  --   LABPROT  --   --  59.0* 26.4*  --  22.7*  INR  --   --  7.1* 2.5*  --  2.1*  CREATININE  --    < > 0.89 0.75  --  1.13*  TROPONINIHS 10  --   --   --   --   --    < > = values in this interval not displayed.    Estimated Creatinine Clearance: 42.9 mL/min (A) (by C-G formula based on SCr of 1.13 mg/dL (H)).  Assessment: 60 year old female who presented with hematemesis and supra-therapeutic INR after "loading" herself with warfarin."   Now s/p EGD = > to resume warfarin and heparin Received Vitamin K 5 mg IV and 2.5 mg po 3/2  INR today 2.1 HgB 9.6 last PM  Goal of Therapy:  Heparin level 0.3-0.5 units/ml Monitor platelets by anticoagulation protocol: Yes  INR goal 2.5 to 3.5   Plan:  Resume heparin at 700 units / hr Warfarin 7.5 mg po x 1 dose this PM 8 hour heparin level  Daily heparin level, CBC, INR  Thank you Anette Guarneri, PharmD  11/27/2020,1:34 PM

## 2020-11-27 NOTE — Anesthesia Postprocedure Evaluation (Signed)
Anesthesia Post Note  Patient: Mary [name]  Procedure(s) Performed: ESOPHAGOGASTRODUODENOSCOPY (EGD) WITH PROPOFOL (N/A ) BALLOON DILATION (N/A )     Patient location during evaluation: PACU Anesthesia Type: MAC Level of consciousness: awake and alert and oriented Pain management: pain level controlled Vital Signs Assessment: post-procedure vital signs reviewed and stable Respiratory status: spontaneous breathing, nonlabored ventilation and respiratory function stable Cardiovascular status: blood pressure returned to baseline Postop Assessment: no apparent nausea or vomiting Anesthetic complications: no   No complications documented.  Last Vitals:  Vitals:   11/27/20 1210 11/27/20 1337  BP: 137/80 139/75  Pulse: 89 88  Resp: 12   Temp:  (!) 36.2 C  SpO2: 100% 100%    Last Pain:  Vitals:   11/27/20 1205  TempSrc:   PainSc: 0-No pain                 Brennan Bailey

## 2020-11-27 NOTE — Telephone Encounter (Signed)
Called patient to confirm appointment for Monday March 7th. Patient is currently admitted to the hospital and would need to reschedule. Miss Linebaugh stated she would like to be seen in March but the only thing I have available as of now is April 7th.   Miss Doolan connected me with Maggie Schwalbe her legal aid social worker and we rescheduled the appointment. I advised I didn't have an appointment in March for her but that I would pass a message to our nurse case manager to reach out if using a TOC slot would be appropriate for her to be seen in march.   While I was on the phone the legal aid social worker asked if we knew how to get in touch with Aron Baba a social worker for [place] states miss Awwad believes it is very imperative for them to get in touch.   Miss Kong stated it was fine for [place] to reach out to her or Aline Brochure regarding appointment and contact info for [place]. Harrison's phone number is [phone] and his email address is [email or web address].   Please reach out to patient/social worker with pertinent information id appropriate. Thank you!

## 2020-11-28 DIAGNOSIS — R7989 Other specified abnormal findings of blood chemistry: Secondary | ICD-10-CM

## 2020-11-28 LAB — CBC
HCT: 24.9 % — ABNORMAL LOW (ref 36.0–46.0)
Hemoglobin: 9 g/dL — ABNORMAL LOW (ref 12.0–15.0)
MCH: 38.8 pg — ABNORMAL HIGH (ref 26.0–34.0)
MCHC: 36.1 g/dL — ABNORMAL HIGH (ref 30.0–36.0)
MCV: 107.3 fL — ABNORMAL HIGH (ref 80.0–100.0)
Platelets: 227 10*3/uL (ref 150–400)
RBC: 2.32 MIL/uL — ABNORMAL LOW (ref 3.87–5.11)
WBC: 7.8 10*3/uL (ref 4.0–10.5)
nRBC: 0 % (ref 0.0–0.2)

## 2020-11-28 LAB — COMPREHENSIVE METABOLIC PANEL
ALT: 38 U/L (ref 0–44)
AST: 32 U/L (ref 15–41)
Albumin: 2.2 g/dL — ABNORMAL LOW (ref 3.5–5.0)
Alkaline Phosphatase: 129 U/L — ABNORMAL HIGH (ref 38–126)
Anion gap: 6 (ref 5–15)
BUN: 6 mg/dL (ref 6–20)
CO2: 17 mmol/L — ABNORMAL LOW (ref 22–32)
Calcium: 8.5 mg/dL — ABNORMAL LOW (ref 8.9–10.3)
Chloride: 114 mmol/L — ABNORMAL HIGH (ref 98–111)
Creatinine, Ser: 0.88 mg/dL (ref 0.44–1.00)
GFR, Estimated: >60 mL/min (ref >60)
Glucose, Bld: 73 mg/dL (ref 70–99)
Potassium: 5.4 mmol/L — ABNORMAL HIGH (ref 3.5–5.1)
Sodium: 137 mmol/L (ref 135–145)
Total Bilirubin: 0.9 mg/dL (ref 0.3–1.2)
Total Protein: 5 g/dL — ABNORMAL LOW (ref 6.5–8.1)

## 2020-11-28 LAB — HEPARIN LEVEL (UNFRACTIONATED)
Heparin Unfractionated: <0.1 IU/mL — ABNORMAL LOW (ref 0.30–0.70)
Heparin Unfractionated: 0.18 IU/mL — ABNORMAL LOW (ref 0.30–0.70)
Heparin Unfractionated: 0.29 IU/mL — ABNORMAL LOW (ref 0.30–0.70)

## 2020-11-28 LAB — PROTIME-INR
INR: 2.1 — ABNORMAL HIGH (ref 0.8–1.2)
Prothrombin Time: 22.5 seconds — ABNORMAL HIGH (ref 11.4–15.2)

## 2020-11-28 LAB — PHOSPHORUS: Phosphorus: 3.2 mg/dL (ref 2.5–4.6)

## 2020-11-28 LAB — MAGNESIUM: Magnesium: 1.7 mg/dL (ref 1.7–2.4)

## 2020-11-28 MED ORDER — WARFARIN SODIUM 5 MG PO TABS
5.0000 mg | ORAL_TABLET | Freq: Once | ORAL | Status: AC
  Administered 2020-11-28: 5 mg via ORAL
  Filled 2020-11-28: qty 1

## 2020-11-28 MED ORDER — SODIUM ZIRCONIUM CYCLOSILICATE 10 G PO PACK
10.0000 g | PACK | Freq: Once | ORAL | Status: AC
  Administered 2020-11-28: 10 g via ORAL
  Filled 2020-11-28: qty 1

## 2020-11-28 MED ORDER — HEPARIN (PORCINE) 25000 UT/250ML-% IV SOLN
1100.0000 [IU]/h | INTRAVENOUS | Status: DC
  Filled 2020-11-28: qty 250

## 2020-11-28 MED ORDER — MAGNESIUM SULFATE 2 GM/50ML IV SOLN
2.0000 g | Freq: Once | INTRAVENOUS | Status: AC
  Administered 2020-11-28: 2 g via INTRAVENOUS
  Filled 2020-11-28: qty 50

## 2020-11-28 NOTE — Progress Notes (Signed)
PROGRESS NOTE    [name]  NAT:[id number] DOB: [birth year] DOA: 11/24/2020 PCP: Mary Pier, MD   Brief Narrative:  Mary [name] is a 60 y.o. 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 No abdominal pain, and she also has had a loose bowel movement for 5-day. She admitted to been using marijuana.  Upon arrival to ED, she was hemodynamically stable.  CT head was unremarkable. She had elevated LFTs for which ultrasound abdomen was done which showed fatty liver similar to last year.  She had mild AKI as well as supratherapeutic INR of 8.2.  She received 2.5 mg of oral vitamin K.  GI was consulted.  INR was still elevated over 7 the next day so she received another dose of vitamin K 5 mg IV.  INR dropped to 2.1.  Hemoglobin also dropped from 9.9 at the time of admission to 6.4 on 11/26/2020 for which patient received 1 unit of PRBC transfusion.  Due to complicated cardiac history, cardiology was consulted who recommended that patient should be on anticoagulation at all times unless there is a life-threatening emergency.  Due to subtherapeutic INR  (goal INR 2.5-3.5 ) patient was bridged with IV heparin.  She has not had any further hematemesis.  Status post EGD today 11/27/2020 which shows pyloric stenosis.  This was dilated by GI.  No other source of bleeding.  She was started on Coumadin.   Assessment & Plan:   Active Problems:   Elevated INR   Vomiting   GI bleed   Acquired pyloric stenosis  Nausea and vomiting/upper GI bleed/hematemesis/acute blood loss anemia/pyloric stenosis: Hemoglobin dropped from 9.9 at the time of admission to 6.4 on 11/26/2020 for which patient received 1 unit of PRBC transfusion.  She has not had any further hematemesis.  Status  post EGD today 11/27/2020 which shows pyloric stenosis.  This was dilated to 10 mm.  No other source of bleeding.  Hemoglobin is stable.  Continue twice daily Protonix and monitor H&H every 12 hours.  She has been started on Reglan.  She is tolerating clear liquid diet.  Will advance slowly to full liquid and then to soft diet.  History of mechanical mitral and tricuspid valve: Due to significant cardiac history and the need for keeping her INR in therapeutic range and now that it is highly contraindicated due to active GI bleeding, cardiology was consulted.  They have recommended that patient must be anticoagulated unless she has life-threatening hemorrhage and recommended to start her on heparin if her INR falls less than 2.5.  Her INR is 2.1 again today.  Continue heparin and Coumadin.  Repeat INR in the morning.  Severe hypokalemia: Resolved.  Hyperkalemia: We will give her a dose of Lokelma.  Hypomagnesemia: Resolved.  Hypophosphatemia: Resolved.  Mild AKI: Initially resolved but creatinine up again.  Continue to hold diuretics.  Elevated LFTs: -Elevated AST/ALT ratio to implying active drinking.  However patient denied. -RUQ ultrasound showed fatty liver similar as last year.  LFTs improving.  No abdominal pain or tenderness.  History of pyloric stenosis -As above.  History of alcohol abuse -No symptoms signs of acute withdrawal, CIWA protocol with as needed benzos.  Alcohol level within normal range.  -Diastolic CHF chronic: Looks euvolemic.  Now recovering from AKI.  Hold diuretic for now.  Reassess tomorrow.  History of stroke: Patient tells me that she had a stroke during Thanksgiving 2021 and since then she has been having slurred speech.  Marijuana abuse: Counseled.  Right lower extremity weakness: Patient complained of right lower extremity weakness since about 1 to 2 weeks.  She also complains of low back pain which also started about 2 weeks ago.  She tells me that she  had lumbar puncture done during Thanksgiving when she had stroke.  Her history is very vague.  On examination, she does have positive straight leg raise test.  CT lumbar spine did not show any acute pathology however it shows some suspicion of possible [place] mass and radiology recommends CT abdomen and pelvis with IV and oral contrast which was done and ruled out any mass.  I wonder if she has been having this weakness since her stroke 6 months ago.  Her history is very vague.  No other etiology of sudden right lower extremity is found.  PT OT has been consulted to assess her.  DVT prophylaxis:    Code Status: Full Code  Family Communication:  None present at bedside.  Plan of care discussed with patient in length and he verbalized understanding and agreed with it.  Status is: Inpatient  Remains inpatient appropriate because:Ongoing diagnostic testing needed not appropriate for outpatient work up   Dispo: The patient is from: Home              Anticipated d/c is to: Home              Patient currently is not medically stable to d/c.   Difficult to place patient No        Estimated body mass index is 22.13 kg/m as calculated from the following:   Height as of this encounter: 5\' 2"  (1.575 m).   Weight as of this encounter: 54.9 kg.      Nutritional status:               Consultants:   GI  Procedures:   None  Antimicrobials:  Anti-infectives (From admission, onward)   None         Subjective: Seen and examined.  No specific complaint but she said " everything hurts".   Objective: Vitals:   11/27/20 1951 11/27/20 2001 11/28/20 0005 11/28/20 0447  BP: 112/80   118/75  Pulse: (!) 103  97 (!) 102  Resp:  20    Temp: 98.2 F (36.8 C)   98.5 F (36.9 C)  TempSrc:    Oral  SpO2: 100%   100%  Weight:      Height:        Intake/Output Summary (Last 24 hours) at 11/28/2020 1124 Last data filed at 11/28/2020 0455 Gross per 24 hour  Intake 291.72 ml   Output 200 ml  Net 91.72 ml   Filed Weights   11/24/20 1144  Weight: 54.9 kg    Examination:  General exam: Appears calm and comfortable  Respiratory system: Clear to auscultation. Respiratory effort normal. Cardiovascular system: S1 & S2 heard, RRR. No JVD, murmurs, rubs, gallops or clicks. No pedal edema. Gastrointestinal system: Abdomen is nondistended, soft and nontender. No organomegaly or masses felt. Normal bowel sounds heard. Central nervous system: Alert and oriented. No focal neurological deficits. Extremities: Symmetric 5 x 5 power. Skin: No rashes, lesions or ulcers.  Psychiatry: Judgement and insight appear poor. Mood & affect flat.  Data Reviewed: I have personally reviewed following labs and imaging  studies  CBC: Recent Labs  Lab 11/24/20 1145 11/24/20 2218 11/25/20 0835 11/25/20 2347 11/26/20 2108 11/27/20 1529 11/28/20 0110  WBC 7.6   < > 6.0 4.5 6.2 6.4 7.8  NEUTROABS 5.6  --   --   --   --  4.4  --   HGB 9.9*   < > 8.0* 6.4* 9.6* 9.2* 9.0*  HCT 28.0*   < > 22.2* 16.3* 27.0* 26.3* 24.9*  MCV 120.7*   < > 117.5* 119.0* 109.3* 108.7* 107.3*  PLT 367   < > 290 233 265 241 227   < > = values in this interval not displayed.   Basic Metabolic Panel: Recent Labs  Lab 11/24/20 1656 11/25/20 0835 11/25/20 2347 11/27/20 0325 11/28/20 0110  NA 143 142 140 135 137  K 3.0* 3.3* 2.6* 5.1 5.4*  CL 113* 115* 117* 111 114*  CO2 17* 17* 16* 18* 17*  GLUCOSE 77 83 70 97 73  BUN 6 7 5* 6 6  CREATININE 1.08* 0.89 0.75 1.13* 0.88  CALCIUM 8.4* 7.8* 6.9* 8.5* 8.5*  MG 1.6* 1.4* 1.1* 1.9 1.7  PHOS 2.8 2.1*  --   --  3.2   GFR: Estimated Creatinine Clearance: 55.1 mL/min (by C-G formula based on SCr of 0.88 mg/dL). Liver Function Tests: Recent Labs  Lab 11/24/20 1656 11/25/20 0835 11/25/20 2347 11/27/20 0325 11/28/20 0110  AST 214* 97* 38 35 32  ALT 99* 74* 45* 48* 38  ALKPHOS 174* 146* 97 149* 129*  BILITOT 0.9 0.8 0.9 1.2 0.9  PROT 5.7* 4.7* 3.7*  5.1* 5.0*  ALBUMIN 2.5* 2.1* 1.6* 2.3* 2.2*   Recent Labs  Lab 11/24/20 1331  LIPASE 20   No results for input(s): AMMONIA in the last 168 hours. Coagulation Profile: Recent Labs  Lab 11/25/20 0835 11/25/20 2347 11/27/20 0325 11/27/20 1323 11/28/20 0110  INR 7.1* 2.5* 2.1* 1.8* 2.1*   Cardiac Enzymes: No results for input(s): CKTOTAL, CKMB, CKMBINDEX, TROPONINI in the last 168 hours. BNP (last 3 results) No results for input(s): PROBNP in the last 8760 hours. HbA1C: No results for input(s): HGBA1C in the last 72 hours. CBG: Recent Labs  Lab 11/24/20 1141  GLUCAP 102*   Lipid Profile: No results for input(s): CHOL, HDL, LDLCALC, TRIG, CHOLHDL, LDLDIRECT in the last 72 hours. Thyroid Function Tests: No results for input(s): TSH, T4TOTAL, FREET4, T3FREE, THYROIDAB in the last 72 hours. Anemia Panel: No results for input(s): VITAMINB12, FOLATE, FERRITIN, TIBC, IRON, RETICCTPCT in the last 72 hours. Sepsis Labs: No results for input(s): PROCALCITON, LATICACIDVEN in the last 168 hours.  Recent Results (from the past 240 hour(s))  SARS CORONAVIRUS 2 (TAT 6-24 HRS) Nasopharyngeal Nasopharyngeal Swab     Status: None   Collection Time: 11/24/20 10:18 PM   Specimen: Nasopharyngeal Swab  Result Value Ref Range Status   SARS Coronavirus 2 NEGATIVE NEGATIVE Final    Comment: (NOTE) SARS-CoV-2 target nucleic acids are NOT DETECTED.  The SARS-CoV-2 RNA is generally detectable in upper and lower respiratory specimens during the acute phase of infection. Negative results do not preclude SARS-CoV-2 infection, do not rule out co-infections with other pathogens, and should not be used as the sole basis for treatment or other patient management decisions. Negative results must be combined with clinical observations, patient history, and epidemiological information. The expected result is Negative.  Fact Sheet for Patients: SugarRoll.be  Fact Sheet  for Healthcare Providers: https://www.woods-mathews.com/  This test is not yet approved or cleared by the  [place] Architectural technologist and  has been authorized for detection and/or diagnosis of SARS-CoV-2 by FDA under an Print production planner (EUA). This EUA will remain  in effect (meaning this test can be used) for the duration of the COVID-19 declaration under Se ction 564(b)(1) of the Act, 21 U.S.C. section 360bbb-3(b)(1), unless the authorization is terminated or revoked sooner.  Performed at [facility], [place] [address] [place],  [address]       Radiology Studies: CT ABDOMEN PELVIS W CONTRAST  Result Date: 11/26/2020 CLINICAL DATA:  Abdominal mass. Anemia and vomiting. CT of the lumbar spine demonstrated adherent bowel loops versus pelvic masses. EXAM: CT ABDOMEN AND PELVIS WITH CONTRAST TECHNIQUE: Multidetector CT imaging of the abdomen and pelvis was performed using the standard protocol following bolus administration of intravenous contrast. CONTRAST:  128mL OMNIPAQUE IOHEXOL 300 MG/ML  SOLN COMPARISON:  CT of the lumbar spine on 11/25/2020 and prior CT of the abdomen and pelvis with contrast on 04/23/2020. FINDINGS: Lower chest: No acute abnormality. Hepatobiliary: There is evidence of diffuse hepatic steatosis. No overt cirrhotic morphology. No hepatic masses or biliary dilatation. The gallbladder has been removed. Pancreas: Unremarkable. No pancreatic ductal dilatation or surrounding inflammatory changes. Spleen: Normal in size without focal abnormality. Adrenals/Urinary Tract: Adrenal glands are unremarkable. Kidneys are normal, without renal calculi, focal lesion, or hydronephrosis. Bladder is unremarkable. Stomach/Bowel: Bowel shows no evidence of obstruction, ileus, inflammation or lesion. No free air identified. Vascular/Lymphatic: No significant vascular findings are present. No enlarged abdominal or pelvic lymph nodes. Reproductive: Uterus and bilateral adnexa  are unremarkable. Small amount of free fluid in the pelvis. There is no evidence of pelvic mass with prior lumbar CT findings representing bowel loops as well as some adjacent free fluid. Other: Focal midline upper abdominal ventral hernia contains fat. No evidence of focal abscess. Musculoskeletal: No acute or significant osseous findings. IMPRESSION: 1. Hepatic steatosis. 2. Small amount of free fluid in the pelvis. 3. Focal midline upper abdominal ventral hernia containing fat. 4. No evidence of pelvic masses. Lumbar CT findings are secondary to unopacified bowel loops. Electronically Signed   By: Aletta Edouard M.D.   On: 11/26/2020 16:53    Scheduled Meds: . folic acid  1 mg Oral Daily  . gabapentin  100 mg Oral Daily   And  . gabapentin  400 mg Oral QHS  . metoCLOPramide (REGLAN) injection  10 mg Intravenous Q6H  . multivitamin with minerals  1 tablet Oral Daily  . pantoprazole  40 mg Oral BID  . sodium chloride flush  10-40 mL Intracatheter Q12H  . thiamine  100 mg Oral Daily  . vitamin B-12  500 mcg Oral Daily  . Warfarin - Pharmacist Dosing Inpatient   Does not apply q1600   Continuous Infusions: . heparin 900 Units/hr (11/28/20 0405)  . phytonadione (VITAMIN K) IV       LOS: 4 days   Time spent: 28 -minute   Darliss Cheney, MD [facility]  11/28/2020, 11:24 AM   To contact the attending provider between 7A-7P or the covering provider during after hours 7P-7A, please log into the web site [email or web address].

## 2020-11-28 NOTE — Progress Notes (Signed)
ANTICOAGULATION CONSULT NOTE - Follow Up Consult  Pharmacy Consult for Heparin / Warfarin Indication: mechanical valve  Allergies  Allergen Reactions  . Oxycodone Itching  . Tape Rash and Other (See Comments)    THE ONLY TAPE THAT IS TOLERATED IS PAPER TAPE. EKG leads inflame the skin    Patient Measurements: Height: 5\' 2"  (157.5 cm) Weight: 54.9 kg (121 lb) IBW/kg (Calculated) : 50.1  Vital Signs:    Labs: Recent Labs    11/25/20 2347 11/26/20 2108 11/27/20 0325 11/27/20 1323 11/27/20 1529 11/28/20 0110 11/28/20 0131 11/28/20 1313 11/28/20 1948  HGB 6.4* 9.6*  --   --  9.2* 9.0*  --   --   --   HCT 16.3* 27.0*  --   --  26.3* 24.9*  --   --   --   PLT 233 265  --   --  241 227  --   --   --   LABPROT 26.4*  --  22.7* 20.5*  --  22.5*  --   --   --   INR 2.5*  --  2.1* 1.8*  --  2.1*  --   --   --   HEPARINUNFRC  --   --   --   --   --   --  <0.10* 0.18* 0.29*  CREATININE 0.75  --  1.13*  --   --  0.88  --   --   --     Estimated Creatinine Clearance: 55.1 mL/min (by C-G formula based on SCr of 0.88 mg/dL).  Assessment: 60 year old female who presented with hematemesis and supra-therapeutic INR after "loading" herself with warfarin."   Now s/p EGD = > to resume warfarin and heparin Received Vitamin K 5 mg IV and 2.5 mg po 3/2  Heparin level tonight came back slightly subtherapeutic at 0.29, on 1050 units/hr. No s/sx of bleeding or infusion issues per RN.   Goal of Therapy:  Heparin level 0.3-0.5 units/ml Monitor platelets by anticoagulation protocol: Yes  INR goal 2.5 to 3.5   Plan:  Increase heparin to 1100 units/hr Daily heparin level and INR  Monitor for s/sx of bleed and CBC  Antonietta Jewel, PharmD, [place] Pharmacist  Phone: [phone] 11/28/2020 9:16 PM  Please check AMION for all [place] phone numbers After 10:00 PM, call [place] [phone]

## 2020-11-28 NOTE — Progress Notes (Signed)
Pt had 7 beats run of non sustained Vtach. VS stable, pt denied chest pain and SOB, no change in pt assessment. Potassium elevated at 5.4. MD notified. Will continue to monitor pt.

## 2020-11-28 NOTE — Evaluation (Signed)
Occupational Therapy Evaluation Patient Details Name: Mary [name] MRN: [id number] DOB: Dec 16, 1960 Today's Date: 11/28/2020    History of Present Illness Mary [name] is a 60 y.o. 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   Clinical Impression   PTA, pt lives alone in second floor apartment and reports typically able to manage ADLs and mobility at home though has been having increasing difficulty, requiring use of RW and unable to complete IADLs. Evaluation limited by pt's reports of B LE pain. Pt with vague answers throughout session regarding the pain, any residual deficits from previous CVA, and what led to admission. Pt ultimately Min A for bed mobility, min guard for taking a few steps at bedside with RW before opting to return to bed. Pt overall Setup for UB ADLs and Min A for LB ADLs due to deficits noted below. Based on current functional abilities, pt may have difficulty navigating steps into apartment, ADLs and IADLs at home alone. Recommend SNF for short term rehab at this time. Will monitor and progress OOB activities as appropriate pending pain improvement.     Follow Up Recommendations  SNF ([place] follow-up if declines SNF)    Equipment Recommendations  None recommended by OT    Recommendations for Other Services       Precautions / Restrictions Precautions Precautions: Fall Restrictions Weight Bearing Restrictions: No      Mobility Bed Mobility Overal bed mobility: Needs Assistance Bed Mobility: Supine to Sit;Sit to Supine     Supine to sit: Min assist;HOB elevated Sit to supine: Min guard   General bed mobility comments: Min A, reaching out for handheld assist from OT. Increased time using UE to move LE to EOB. able to get B  LE back into bed    Transfers Overall transfer level: Needs assistance Equipment used: Rolling walker (2 wheeled) Transfers: Sit to/from Stand Sit to Stand: Min guard         General transfer comment: min guard for standing at bedside with RW, increased time to rise and cues to for posture. Pt took 2 steps forward, paused and when asked if she needed to sit, backed up to bed and laid back down    Balance Overall balance assessment: Needs assistance Sitting-balance support: No upper extremity supported;Feet supported Sitting balance-Leahy Scale: Fair     Standing balance support: Bilateral upper extremity supported;During functional activity Standing balance-Leahy Scale: Poor Standing balance comment: reliant on UE support in standing at this time                           ADL either performed or assessed with clinical judgement   ADL Overall ADL's : Needs assistance/impaired Eating/Feeding: Independent;Sitting   Grooming: Set up;Sitting   Upper Body Bathing: Set up;Sitting   Lower Body Bathing: Minimal assistance;Sit to/from stand   Upper Body Dressing : Set up;Sitting   Lower Body Dressing: Minimal assistance;Sit to/from stand   Toilet Transfer: Minimal assistance;Stand-pivot;RW   Toileting- Water quality scientist and Hygiene: Min guard;Sit to/from stand         General ADL Comments: Per staff, has been transferring to [facility] with some assist. Pt with limited participation due to B LE pain reports.     Vision Patient Visual Report: No change from baseline Vision Assessment?:  No apparent visual deficits     Perception     Praxis      Pertinent Vitals/Pain Pain Assessment: Faces Faces Pain Scale: Hurts little more Pain Location: B LE Pain Descriptors / Indicators: Grimacing;Guarding;Sore Pain Intervention(s): Monitored during session;Repositioned;Limited activity within patient's tolerance;Patient requesting pain meds-RN notified     Hand  Dominance Right   Extremity/Trunk Assessment Upper Extremity Assessment Upper Extremity Assessment: Generalized weakness   Lower Extremity Assessment Lower Extremity Assessment: Defer to PT evaluation   Cervical / Trunk Assessment Cervical / Trunk Assessment: Kyphotic   Communication Communication Communication: No difficulties   Cognition Arousal/Alertness: Awake/alert Behavior During Therapy: Flat affect Overall Cognitive Status: No family/caregiver present to determine baseline cognitive functioning                                 General Comments: Difficult to assess. Pt with vague answers, varying at times during session. When asked about residual deficits from CVA, pt reports it is part of the problem - when asked what part of problem it was - she reports "all of it". flat affect throughout. Initially reported no family nearby to assist but later says son lives in [place] expects to be discharged to:: Private residence Living Arrangements: Alone Available Help at Discharge: Family;Available PRN/intermittently Type of Home: Apartment Home Access: Stairs to enter Entrance Stairs-Number of Steps: 2 flights Entrance Stairs-Rails: Right;Left Home Layout: One level     Bathroom Shower/Tub: Tub/shower unit;Walk-in shower   Bathroom Toilet: Standard     Home Equipment: Cane - single point;Walker - 2 wheels;Bedside commode;Shower seat          Prior Functioning/Environment Level of Independence: Independent with assistive device(s)        Comments: reports able to mobilize typically without RW though has been using due to recent increased difficulty mobilizing. Pt reports able to complete ADLs, IADLs though has not been doing IADLs due to pain and difficulty mobilizing. Does not drive, son assists with grocery shopping.        OT Problem List: Decreased  strength;Decreased activity tolerance;Impaired balance (sitting and/or standing);Decreased safety awareness;Decreased knowledge of use of DME or AE;Pain      OT Treatment/Interventions: Self-care/ADL training;Therapeutic exercise;DME and/or AE instruction;Therapeutic activities;Patient/family education;Balance training    OT Goals(Current goals can be found in the care plan section) Acute Rehab OT Goals Patient Stated Goal: pain control OT Goal Formulation: With patient Time For Goal Achievement: 12/12/20 Potential to Achieve Goals: Good ADL Goals Pt Will Perform Grooming: with supervision;standing Pt Will Perform Lower Body Dressing: with supervision;sit to/from stand Pt Will Transfer to Toilet: with supervision;ambulating Pt Will Perform Toileting - Clothing Manipulation and hygiene: with supervision;sit to/from stand;sitting/lateral leans  OT Frequency: Min 2X/week   Barriers to D/C:            Co-evaluation              AM-PAC OT "6 Clicks" Daily Activity     Outcome Measure Help from another person eating meals?: None Help from another person taking care of personal grooming?: A Little Help from another person toileting, which includes using toliet, bedpan, or urinal?: A Little Help from another person bathing (including washing, rinsing, drying)?: A Little Help from another person to put on and taking off regular  upper body clothing?: A Little Help from another person to put on and taking off regular lower body clothing?: A Little 6 Click Score: 19   End of Session Equipment Utilized During Treatment: Rolling walker Nurse Communication: Mobility status;Patient requests pain meds  Activity Tolerance: Patient limited by pain Patient left: in bed;with call bell/phone within reach  OT Visit Diagnosis: Other abnormalities of gait and mobility (R26.89);Unsteadiness on feet (R26.81);Muscle weakness (generalized) (M62.81);Pain Pain - Right/Left:  (bilateral) Pain - part  of body: Leg                Time: 9570-2202 OT Time Calculation (min): 12 min Charges:  OT General Charges $OT Visit: 1 Visit OT Evaluation $OT Eval Moderate Complexity: 1 Mod  Malachy Chamber, OTR/L Acute Rehab Services Office: [phone]  Layla Maw 11/28/2020, 1:21 PM

## 2020-11-28 NOTE — Progress Notes (Addendum)
[facility] Progress Note  CC:  Nausea and vomiting   Subjective: She is tired this morning, a bit drowsy. No Dilaudid since yesterday around 3pm. She complains "every thing hurts".  No new pains. She drank tea and ate a small amount of apple sauce this am without N/V or abdominal pain. No BM this morning. No CP or SOB.   Objective:   EGD 11/27/2020: - Gastric stenosis was found at the pylorus. Dilated. 10 mm max - Gastroesophageal flap valve classified as Hill Grade III (minimal fold, loose to endoscope, hiatal hernia likely). - The examination was otherwise normal. - No specimens collected  Vital signs in last 24 hours: Temp:  [97.2 F (36.2 C)-98.5 F (36.9 C)] 98.5 F (36.9 C) (03/05 0447) Pulse Rate:  [86-103] 102 (03/05 0447) Resp:  [10-20] 20 (03/04 2001) BP: (112-139)/(56-80) 118/75 (03/05 0447) SpO2:  [99 %-100 %] 100 % (03/05 0447) Last BM Date: 11/26/20 General:  Sleeping soundly but easily arousable.  Heart: Regular rate and rhythm, no murmurs. Pulm: Breath sounds clear throughout. Abdomen: Soft, upper abdomen mildly distended but soft, epigastric tenderness without rebound or guarding. Positive bowel sounds to all 4 quadrants. Extremities:  Without edema. Neurologic:  Alert and  oriented x 4;  grossly normal neurologically. Psych:  Alert and cooperative. Normal mood and affect.   Lab Results: Recent Labs    11/26/20 2108 11/27/20 1529 11/28/20 0110  WBC 6.2 6.4 7.8  HGB 9.6* 9.2* 9.0*  HCT 27.0* 26.3* 24.9*  PLT 265 241 227   BMET Recent Labs    11/25/20 2347 11/27/20 0325 11/28/20 0110  NA 140 135 137  K 2.6* 5.1 5.4*  CL 117* 111 114*  CO2 16* 18* 17*  GLUCOSE 70 97 73  BUN 5* 6 6  CREATININE 0.75 1.13* 0.88  CALCIUM 6.9* 8.5* 8.5*   LFT Recent Labs    11/28/20 0110  PROT 5.0*  ALBUMIN 2.2*  AST 32  ALT 38  ALKPHOS 129*  BILITOT 0.9   PT/INR Recent Labs    11/27/20 1323 11/28/20 0110  LABPROT 20.5* 22.5*   INR 1.8* 2.1*     Assessment / Plan:   Mary [name] is a 60 y.o. female.  PMH chronic Coumadin for 2014 MVR and TVR due to rheumatic heart disease.  Macrocytic anemia.  GERD.  Protein malnutrition.  Abnormal LFTs, fatty liver per CT scan 03/2020.  HCV negative 2018 TIA.  1. N/V, hematemesis in setting of pyloric stenosis.  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  -Continue Reglan $RemoveBeforeDE'10mg'FZoUEwwLQekQvIi$  po Q 6 hrs -PPI bid -Advance to full diet  -Await further recommendations per Dr. Carlean Purl  2. Acute on chronic macrocytic anemia. Hg stable 9.0. MCV 107. -Repeat CBC in am   3. History of elevated LFTs. Hepatic steatosis. Alk phos 129. Normal AST/ALT.   4. History of MVR and TVR secondary to rheumatic heart disease. Coumadin on hold. On heparin infusion.  5. Hyperkalemia. K+ 5.4.  -Management per the hospitalist   6. Nonsustained Vtach 7 beat run last night    Active Problems:   Elevated INR   Vomiting   GI bleed   Acquired pyloric stenosis     LOS: 4 days   Noralyn Pick  11/28/2020, 9:44 AM     [facility] Attending   I have taken an interval history, reviewed the chart and examined the patient. I agree with  the Advanced Practitioner's note, impression and recommendations.     She has tolerated pyloric stenosis dilation   I do not think she needs metoclopramide as pyloric stenosis has been dilated and she does not have a motility issue that I know of, and that may be responsible for her sleepiness as well.  I have discontinued that.  It is okay to resume oral anticoagulation  Please continue twice daily high-dose PPI  I have ordered to advance diet as tolerated   We will arrange for outpatient GI follow-up with Dr. Havery Moros or one of our advanced practitioners in the coming weeks  I am signing off at this point please contact us if further questions arise during this hospitalization.  Gatha Mayer, MD, [facility]  Gastroenterology 11/28/2020 11:58 AM  [phone] pager

## 2020-11-28 NOTE — Progress Notes (Signed)
PT Cancellation Note  Patient Details Name: Mary [name] MRN: [id number] DOB: March 13, 1961   Cancelled Treatment:    Reason Eval/Treat Not Completed: Pain limiting ability to participate. RN made aware. Will check back tomorrow.   Leighton Roach, [place]  Pager [phone] Office [place] 11/28/2020, 3:35 PM

## 2020-11-28 NOTE — Progress Notes (Signed)
ANTICOAGULATION CONSULT NOTE - Follow Up Consult  Pharmacy Consult for Heparin / Warfarin Indication: mechanical valve  Allergies  Allergen Reactions  . Oxycodone Itching  . Tape Rash and Other (See Comments)    THE ONLY TAPE THAT IS TOLERATED IS PAPER TAPE. EKG leads inflame the skin    Patient Measurements: Height: 5\' 2"  (157.5 cm) Weight: 54.9 kg (121 lb) IBW/kg (Calculated) : 50.1  Vital Signs: Temp: 98.5 F (36.9 C) (03/05 0447) Temp Source: Oral (03/05 0447) BP: 118/75 (03/05 0447) Pulse Rate: 102 (03/05 0447)  Labs: Recent Labs    11/25/20 2347 11/26/20 2108 11/27/20 0325 11/27/20 1323 11/27/20 1529 11/28/20 0110 11/28/20 0131 11/28/20 1313  HGB 6.4* 9.6*  --   --  9.2* 9.0*  --   --   HCT 16.3* 27.0*  --   --  26.3* 24.9*  --   --   PLT 233 265  --   --  241 227  --   --   LABPROT 26.4*  --  22.7* 20.5*  --  22.5*  --   --   INR 2.5*  --  2.1* 1.8*  --  2.1*  --   --   HEPARINUNFRC  --   --   --   --   --   --  <0.10* 0.18*  CREATININE 0.75  --  1.13*  --   --  0.88  --   --     Estimated Creatinine Clearance: 55.1 mL/min (by C-G formula based on SCr of 0.88 mg/dL).  Assessment: 60 year old female who presented with hematemesis and supra-therapeutic INR after "loading" herself with warfarin."   Now s/p EGD = > to resume warfarin and heparin Received Vitamin K 5 mg IV and 2.5 mg po 3/2  Heparin level after dose increase 0.18 and INR 2.1. Hgb stable around 9.0 and plts wnl. No bleeding noted at this time. Patient received warfarin 7.5 mg x1 on 3/4.   Goal of Therapy:  Heparin level 0.3-0.5 units/ml Monitor platelets by anticoagulation protocol: Yes  INR goal 2.5 to 3.5   Plan:  Increase heparin to 1050 units / hr Warfarin 5 mg x1 today  6-hour heparin level  Daily heparin level and INR  Monitor for s/sx of bleed and [place], PharmD, [facility] Resident [phone] 11/28/2020 1:56 PM

## 2020-11-28 NOTE — TOC CAGE-AID Note (Signed)
Transition of Care [facility]) - CAGE-AID Screening   Patient Details  Name: VESNA KABLE MRN: [id number] Date of Birth: [birth year]  Transition of Care [facility]) CM/SW Contact:    Coralee Pesa, [place] Phone Number: 11/28/2020, 1:14 PM   Clinical Narrative: CSW responded to substance abuse consult. Pt noted that she did not have any problems with drinking or drug use at this time, and declined resources. Pt noted that She had been referred to call Cherly Hensen, but could not remember why or who had requested it. Weekday CSW will follow up with any additional needs as pt not interested in discussion at this time.   CAGE-AID Screening:    Have You Ever Felt You Ought to Cut Down on Your Drinking or Drug Use?: No Have People Annoyed You By Critizing Your Drinking Or Drug Use?: No Have You Felt Bad Or Guilty About Your Drinking Or Drug Use?: No Have You Ever Had a Drink or Used Drugs First Thing In The Morning to Steady Your Nerves or to Get Rid of a Hangover?: No CAGE-AID Score: 0  Substance Abuse Education Offered: No  Substance abuse interventions: Other (must comment) (Pt declined)

## 2020-11-28 NOTE — Progress Notes (Signed)
ANTICOAGULATION CONSULT NOTE - Follow Up Consult  Pharmacy Consult for Heparin / Warfarin Indication: mechanical valve  Allergies  Allergen Reactions  . Oxycodone Itching  . Tape Rash and Other (See Comments)    THE ONLY TAPE THAT IS TOLERATED IS PAPER TAPE. EKG leads inflame the skin    Patient Measurements: Height: 5\' 2"  (157.5 cm) Weight: 54.9 kg (121 lb) IBW/kg (Calculated) : 50.1  Vital Signs: Temp: 98.2 F (36.8 C) (03/04 1951) BP: 112/80 (03/04 1951) Pulse Rate: 97 (03/05 0005)  Labs: Recent Labs    11/25/20 2347 11/26/20 2108 11/27/20 0325 11/27/20 1323 11/27/20 1529 11/28/20 0110 11/28/20 0131  HGB 6.4* 9.6*  --   --  9.2* 9.0*  --   HCT 16.3* 27.0*  --   --  26.3* 24.9*  --   PLT 233 265  --   --  241 227  --   LABPROT 26.4*  --  22.7* 20.5*  --  22.5*  --   INR 2.5*  --  2.1* 1.8*  --  2.1*  --   HEPARINUNFRC  --   --   --   --   --   --  <0.10*  CREATININE 0.75  --  1.13*  --   --  0.88  --     Estimated Creatinine Clearance: 55.1 mL/min (by C-G formula based on SCr of 0.88 mg/dL).  Assessment: 60 year old female who presented with hematemesis and supra-therapeutic INR after "loading" herself with warfarin."   Now s/p EGD = > to resume warfarin and heparin Received Vitamin K 5 mg IV and 2.5 mg po 3/2  Heparin level undetectable on gtt at 700 units/hr. No issues with line or bleeding reported per RN.  Goal of Therapy:  Heparin level 0.3-0.5 units/ml Monitor platelets by anticoagulation protocol: Yes  INR goal 2.5 to 3.5   Plan:  Increase heparin to 900 units / hr 8 hour heparin level   Sherlon Handing, PharmD, BCPS Please see amion for complete clinical pharmacist phone list 11/28/2020,3:25 AM

## 2020-11-29 LAB — BASIC METABOLIC PANEL
Anion gap: 8 (ref 5–15)
BUN: 5 mg/dL — ABNORMAL LOW (ref 6–20)
CO2: 18 mmol/L — ABNORMAL LOW (ref 22–32)
Calcium: 8.2 mg/dL — ABNORMAL LOW (ref 8.9–10.3)
Chloride: 113 mmol/L — ABNORMAL HIGH (ref 98–111)
Creatinine, Ser: 0.78 mg/dL (ref 0.44–1.00)
GFR, Estimated: >60 mL/min (ref >60)
Glucose, Bld: 75 mg/dL (ref 70–99)
Potassium: 4.6 mmol/L (ref 3.5–5.1)
Sodium: 139 mmol/L (ref 135–145)

## 2020-11-29 LAB — TYPE AND SCREEN
ABO/RH(D): O POS
Antibody Screen: NEGATIVE
DAT, IgG: NEGATIVE
Donor AG Type: NEGATIVE
Donor AG Type: NEGATIVE
Unit division: 0
Unit division: 0

## 2020-11-29 LAB — CBC
HCT: 23.1 % — ABNORMAL LOW (ref 36.0–46.0)
Hemoglobin: 7.9 g/dL — ABNORMAL LOW (ref 12.0–15.0)
MCH: 37.3 pg — ABNORMAL HIGH (ref 26.0–34.0)
MCHC: 34.2 g/dL (ref 30.0–36.0)
MCV: 109 fL — ABNORMAL HIGH (ref 80.0–100.0)
Platelets: 241 10*3/uL (ref 150–400)
RBC: 2.12 MIL/uL — ABNORMAL LOW (ref 3.87–5.11)
RDW: 25.3 % — ABNORMAL HIGH (ref 11.5–15.5)
WBC: 5.6 10*3/uL (ref 4.0–10.5)
nRBC: 0 % (ref 0.0–0.2)

## 2020-11-29 LAB — BPAM RBC
Blood Product Expiration Date: 202203112359
Blood Product Expiration Date: 202204072359
ISSUE DATE / TIME: 202203031614
Unit Type and Rh: 5100
Unit Type and Rh: 5100

## 2020-11-29 LAB — PROTIME-INR
INR: 3 — ABNORMAL HIGH (ref 0.8–1.2)
Prothrombin Time: 29.8 seconds — ABNORMAL HIGH (ref 11.4–15.2)

## 2020-11-29 LAB — HEPARIN LEVEL (UNFRACTIONATED): Heparin Unfractionated: 0.57 IU/mL (ref 0.30–0.70)

## 2020-11-29 MED ORDER — WARFARIN SODIUM 2.5 MG PO TABS
2.5000 mg | ORAL_TABLET | Freq: Once | ORAL | Status: AC
  Administered 2020-11-29: 2.5 mg via ORAL
  Filled 2020-11-29: qty 1

## 2020-11-29 MED ORDER — GABAPENTIN 100 MG PO CAPS: 100.0000 mg | ORAL_CAPSULE | Freq: Two times a day (BID) | ORAL | Status: DC

## 2020-11-29 MED ORDER — OXYCODONE HCL 5 MG PO TABS
5.0000 mg | ORAL_TABLET | Freq: Four times a day (QID) | ORAL | Status: DC | PRN
  Filled 2020-11-29: qty 1

## 2020-11-29 MED ORDER — GABAPENTIN 300 MG PO CAPS: 300.0000 mg | ORAL_CAPSULE | Freq: Every day | ORAL | Status: DC

## 2020-11-29 NOTE — Plan of Care (Signed)

## 2020-11-29 NOTE — NC FL2 (Signed)
[place] LEVEL OF CARE SCREENING TOOL     IDENTIFICATION  Patient Name: Mary [name] Birthdate: [birth year] Sex: female Admission Date (Current Location): 11/24/2020  [facility] and [place] Number:  Herbalist and Address:  [facility], [place] [address] [place], [place] [address]      Provider Number: [id number]  Attending Physician Name and Address:  Darliss Cheney, MD  Relative Name and Phone Number:  [name] [phone]    Current Level of Care: Hospital Recommended Level of Care: [place] Prior Approval Number:    Date Approved/Denied: 11/29/20 PASRR Number: [id number]  Discharge Plan: SNF    Current Diagnoses: Patient Active Problem List   Diagnosis Date Noted  . LFT elevation   . Acquired pyloric stenosis   . GI bleed 11/24/2020  . 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 Thiamin deficiency 09/01/2020  . Paresthesia   . Vitamin B12 deficiency 05/04/2020  . Vomiting 04/24/2020  . GERD (gastroesophageal reflux disease)   . Marijuana abuse   . Macrocytic anemia 04/04/2019  . Intermittent complete heart block ([place])   . Syncope 03/28/2019  . Orthostatic hypotension 01/11/2017  . Elevated liver enzymes 01/11/2017  . Atypical chest pain 06/05/2016  . Elevated INR 01/10/2016  . TIA (transient ischemic attack) 09/13/2015  . CVA (cerebral vascular accident) ([place]) 09/12/2015  . SOB (shortness of breath) 02/24/2014  . First degree heart block 06/17/2013  . (HFpEF) heart failure with preserved ejection fraction ([place]) 06/16/2013  . History of bacterial endocarditis 06/16/2013  . Warfarin anticoagulation 06/10/2013  . S/P mitral valve replacement with metallic valve 65/46/5035  . Femoral hernia 05/23/2013  . Mitral valve insufficiency 04/22/2013  . Tricuspid regurgitation 04/22/2013  . 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 SVT (supraventricular tachycardia) ([place])  03/16/2013    Orientation RESPIRATION BLADDER Height & Weight     Self,Time,Situation,Place  Normal Continent Weight: 121 lb (54.9 kg) Height:  5\' 2"  (157.5 cm)  BEHAVIORAL SYMPTOMS/MOOD NEUROLOGICAL BOWEL NUTRITION STATUS      Continent Diet (See DC Summary)  AMBULATORY STATUS COMMUNICATION OF NEEDS Skin   Extensive Assist Verbally Normal                       Personal Care Assistance Level of Assistance  Bathing,Feeding,Dressing Bathing Assistance: Limited assistance Feeding assistance: Independent Dressing Assistance: Limited assistance     Functional Limitations Info  Sight,Hearing,Speech Sight Info: Adequate Hearing Info: Adequate Speech Info: Adequate    SPECIAL CARE FACTORS FREQUENCY  PT (By licensed PT),OT (By licensed OT)     PT Frequency: 5x week OT Frequency: 5x week            Contractures Contractures Info: Not present    Additional Factors Info  Allergies,Code Status Code Status Info: Full Allergies Info: Oxycodone, Tape           Current Medications (11/29/2020):  This is the current hospital active medication list Current Facility-Administered Medications  Medication Dose Route Frequency Provider Last Rate Last Admin  . acetaminophen (TYLENOL) tablet 500 mg  500 mg Oral Q6H PRN Wynetta Fines T, MD   500 mg at 11/29/20 1418  . folic acid (FOLVITE) tablet 1 mg  1 mg Oral Daily Wynetta Fines T, MD   1 mg at 11/29/20 0926  . gabapentin (NEURONTIN) capsule 100 mg  100 mg Oral Daily Wynetta Fines T, MD   100 mg at 11/29/20 [phone]  And  . gabapentin (NEURONTIN) capsule 400 mg  400 mg Oral QHS Wynetta Fines T, MD   400 mg at 11/28/20 2125  . multivitamin with minerals tablet 1 tablet  1 tablet Oral Daily Lequita Halt, MD   1 tablet at 11/29/20 [phone]  . oxyCODONE (Oxy IR/ROXICODONE) immediate release tablet 5 mg  5 mg Oral Q6H PRN Darliss Cheney, MD      . pantoprazole (PROTONIX) EC tablet 40 mg  40 mg Oral BID Vena Rua, PA-C   40 mg at 11/29/20 3151   . sodium chloride flush (NS) 0.9 % injection 10-40 mL  10-40 mL Intracatheter Q12H Curatolo, Adam, DO   20 mL at 11/29/20 0929  . sodium chloride flush (NS) 0.9 % injection 10-40 mL  10-40 mL Intracatheter PRN Curatolo, Adam, DO      . thiamine tablet 100 mg  100 mg Oral Daily Wynetta Fines T, MD   100 mg at 11/29/20 7616  . vitamin B-12 (CYANOCOBALAMIN) tablet 500 mcg  500 mcg Oral Daily Wynetta Fines T, MD   500 mcg at 11/29/20 [phone]  . warfarin (COUMADIN) tablet 2.5 mg  2.5 mg Oral ONCE-1600 Llana Aliment, RPH      . Warfarin - Pharmacist Dosing Inpatient   Does not apply [id number] Darliss Cheney, MD         Discharge Medications: Please see discharge summary for a list of discharge medications.  Relevant Imaging Results:  Relevant Lab Results:   Additional Information SS# [place], [id number]

## 2020-11-29 NOTE — Progress Notes (Incomplete)
Patient continues on heparin IV. Rate was increased from 10.5 to 11 mL/hr and is tolerating well.

## 2020-11-29 NOTE — Progress Notes (Signed)
Physical Therapy Evaluation Patient Details Name: Mary [name] MRN: [id number] DOB: Jul 07, 1961 Today's Date: 11/29/2020   History of Present Illness  Pt is a 60 y.o. female who presented 11/24/20 with repeated nausea vomiting and diarrhea and hematemesis. S/p EGD 11/27/20 which shows pyloric stenosis. This was dilated by GI. No other source of bleeding.Medical history significant of mechanical mitral and tricuspid valve replacement 2014 on Coumadin, history of alcohol abuse, chronic diastolic CHF, pyloric stenosis, marijuana use, and history of stroke.  Clinical Impression  Pt presents with condition above and deficits mentioned below, see PT Problem List. PTA, she was mod I with mobility utilizing her SPC or RW. She lives alone in an apartment up 2 flights of stairs to enter/exit and has limited assistance available. Currently, pt displays significant bil lower extremity weakness (R leg weaker than L), sensitivity to light touch throughout bil legs, and gross incoordination that places her at high risk for falls. In addition, she displays decreased endurance as she was only able to ambulate ~30 ft with a RW and min guard-A today before fatiguing. Pt demonstrates poor bil foot clearance, especially on the R, also placing her at risk for falls. Due to pt's functional decline and likely inability to negotiate 2 flights of stairs to enter/exit her home she would benefit from further skilled PT in the SNF to maximize her independence and safety with all functional mobility to go home. Will continue to follow acutely.    Follow Up Recommendations SNF;Supervision for mobility/OOB    Equipment Recommendations  None recommended by PT    Recommendations for Other Services       Precautions / Restrictions Precautions Precautions: Fall Restrictions Weight Bearing Restrictions: No      Mobility  Bed Mobility Overal bed mobility: Needs Assistance Bed Mobility: Supine to Sit     Supine to sit: Min  guard     General bed mobility comments: Pt needing extra time and use of rail to come to sit EOB with bed flat, min guard for safety.    Transfers Overall transfer level: Needs assistance Equipment used: Rolling walker (2 wheeled) Transfers: Sit to/from [facility] Sit to Stand: Min assist Stand pivot transfers: Min assist       General transfer comment: Extra time and minA towards end of transfer to stand to steady pt with final hand transition to RW from bed. MinA for steadying with stand step bed > recliner with RW.  Ambulation/Gait Ambulation/Gait assistance: Min guard;Min assist Gait Distance (Feet): 30 Feet Assistive device: Rolling walker (2 wheeled) Gait Pattern/deviations: Step-through pattern;Decreased step length - right;Decreased step length - left;Decreased stride length;Decreased dorsiflexion - right;Shuffle;Decreased dorsiflexion - left;Trunk flexed Gait velocity: reduced Gait velocity interpretation: <1.31 ft/sec, indicative of household ambulator General Gait Details: Pt ambulates slowly and with trunk sway and unsteadiness noted in knees, displaying R leg external rotation and drag throughout. L foot also drags some but not as bad as R. INcreased flexion in trunk and knees with fatigue and distance, min guard-A for safety to prevent LOB.  Stairs            Wheelchair Mobility    Modified Rankin (Stroke Patients Only)       Balance Overall balance assessment: Needs assistance Sitting-balance support: No upper extremity supported;Feet supported Sitting balance-Leahy Scale: Fair Sitting balance - Comments: Static sitting EOB with supervision.   Standing balance support: Bilateral upper extremity supported;During functional activity Standing balance-Leahy Scale: Poor Standing balance comment: reliant on UE support  on RW and external assist for standing                             Pertinent Vitals/Pain Pain Assessment:  0-10 Pain Score: 6  Pain Location: legs, back, and lips Pain Descriptors / Indicators: Discomfort;Aching Pain Intervention(s): Limited activity within patient's tolerance;Monitored during session;Repositioned    Home Living Family/patient expects to be discharged to:: Private residence Living Arrangements: Alone Available Help at Discharge: Family;Available PRN/intermittently Type of Home: Apartment Home Access: Stairs to enter Entrance Stairs-Rails: Right;Left Entrance Stairs-Number of Steps: 2 flights Home Layout: One level Home Equipment: Cane - single point;Walker - 2 wheels;Bedside commode;Shower seat      Prior Function Level of Independence: Independent with assistive device(s)         Comments: reports able to mobilize typically without RW though has been using due to recent increased difficulty mobilizing. Pt reports able to complete ADLs, IADLs though has not been doing IADLs due to pain and difficulty mobilizing. Does not drive, son assists with grocery shopping.     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 Pt with vague answers and slow to respond and process, varying at times during session. When asked about residual deficits from CVA, pt unable to report it. flat affect throughout.      General Comments      Exercises     Assessment/Plan    PT Assessment Patient needs continued PT services  PT Problem List Decreased strength;Decreased range of motion;Decreased activity tolerance;Decreased balance;Decreased mobility;Decreased coordination;Decreased knowledge of use of DME;Decreased cognition;Decreased safety awareness;Impaired sensation;Pain       PT Treatment Interventions DME instruction;Gait training;Stair training;Functional mobility training;Therapeutic activities;Therapeutic exercise;Balance training;Neuromuscular re-education;Cognitive remediation;Patient/family education    PT Goals (Current goals can be found in the Care Plan section)  Acute Rehab PT Goals Patient Stated Goal: to improve PT Goal Formulation: With patient Time For Goal Achievement: 12/13/20 Potential to Achieve Goals: Fair    Frequency Min 2X/week   Barriers to discharge Decreased caregiver support;Other (comment) (several flights of stairs to enter home)      Co-evaluation               AM-PAC PT "6 Clicks" Mobility  Outcome Measure Help needed turning from your back to your side while in a flat bed without using bedrails?: A Little Help needed moving from lying on your back to sitting on the side of a flat bed without  using bedrails?: A Little Help needed moving to and from a bed to a chair (including a wheelchair)?: A Little Help needed standing up from a chair using your arms (e.g., wheelchair or bedside chair)?: A Little Help needed to walk in hospital room?: A Little Help needed climbing 3-5 steps with a railing? : A Lot 6 Click Score:  17    End of Session Equipment Utilized During Treatment: Gait belt Activity Tolerance: Patient limited by fatigue Patient left: in chair;with call bell/phone within reach;with chair alarm set Nurse Communication: Mobility status PT Visit Diagnosis: Unsteadiness on feet (R26.81);Other abnormalities of gait and mobility (R26.89);Muscle weakness (generalized) (M62.81);Difficulty in walking, not elsewhere classified (R26.2);Other symptoms and signs involving the nervous system (R29.898)    Time: 4144-3601 PT Time Calculation (min) (ACUTE ONLY): 32 min   Charges:   PT Evaluation $PT Eval Moderate Complexity: 1 Mod PT Treatments $Therapeutic Activity: 8-22 mins        Moishe Spice, PT, DPT Acute Rehabilitation Services  Pager: [phone] Office: [phone]   Orvan Falconer 11/29/2020, 2:04 PM

## 2020-11-29 NOTE — Progress Notes (Signed)
ANTICOAGULATION CONSULT NOTE - Follow Up Consult  Pharmacy Consult for Heparin / Warfarin Indication: mechanical valve   Patient Measurements: Height: 5\' 2"  (157.5 cm) Weight: 54.9 kg (121 lb) IBW/kg (Calculated) : 50.1  Vital Signs: Temp: 98.5 F (36.9 C) (03/06 0619) Temp Source: Oral (03/06 0619) BP: 126/77 (03/06 0619) Pulse Rate: 89 (03/06 0619)  Labs: Recent Labs    11/27/20 0325 11/27/20 1323 11/27/20 1529 11/28/20 0110 11/28/20 0131 11/28/20 1313 11/28/20 1948 11/29/20 0358 11/29/20 0359  HGB  --   --  9.2* 9.0*  --   --   --  7.9*  --   HCT  --   --  26.3* 24.9*  --   --   --  23.1*  --   PLT  --   --  241 227  --   --   --  241  --   LABPROT 22.7* 20.5*  --  22.5*  --   --   --  29.8*  --   INR 2.1* 1.8*  --  2.1*  --   --   --  3.0*  --   HEPARINUNFRC  --   --   --   --    < > 0.18* 0.29*  --  0.57  CREATININE 1.13*  --   --  0.88  --   --   --  0.78  --    < > = values in this interval not displayed.    Estimated Creatinine Clearance: 60.6 mL/min (by C-G formula based on SCr of 0.78 mg/dL).  Assessment: 60 year old female who presented with hematemesis and supra-therapeutic INR after "loading" herself with warfarin." Received Vitamin K 5 mg IV and 2.5 mg po 3/2. Bridged from heparin to warfarin and heparin stopped today as INR is therapeutic at 3.0. Of note, INR bumped from 2.1 to 3.0. Hgb dropped to 7.9 plts wnl. No bleeding noted at this time.   PTA Dose: 2.5 mg TThSSu and 5 mg AOD   Goal of Therapy:  Heparin level 0.3-0.5 units/ml Monitor platelets by anticoagulation protocol: Yes  INR goal 2.5 to 3.5   Plan:  Warfarin 2.5 mg x1 today  Monitor Hgb and s/sx of bleed with Hgb trending down  Daily INR and [place], PharmD, [facility] Resident [phone] 11/29/2020 8:25 AM

## 2020-11-29 NOTE — Progress Notes (Signed)
PROGRESS NOTE    Mary [name]  AVW:[id number] DOB: 06-25-1961 DOA: 11/24/2020 PCP: [name], MD   Brief Narrative:  Mary [name] is a 60 y.o. 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 No abdominal pain, and she also has had a loose bowel movement for 5-day. She admitted to been using marijuana.  Upon arrival to ED, she was hemodynamically stable.  CT head was unremarkable. She had elevated LFTs for which ultrasound abdomen was done which showed fatty liver similar to last year.  She had mild AKI as well as supratherapeutic INR of 8.2.  She received 2.5 mg of oral vitamin K.  GI was consulted.  INR was still elevated over 7 the next day so she received another dose of vitamin K 5 mg IV.  INR dropped to 2.1.  Hemoglobin also dropped from 9.9 at the time of admission to 6.4 on 11/26/2020 for which patient received 1 unit of PRBC transfusion.  Due to complicated cardiac history, cardiology was consulted who recommended that patient should be on anticoagulation at all times unless there is a life-threatening emergency.  Due to subtherapeutic INR  (goal INR 2.5-3.5 ) patient was bridged with IV heparin.  She has not had any further hematemesis.  Status post EGD today 11/27/2020 which shows pyloric stenosis.  This was dilated by GI.  No other source of bleeding.  She was started on Coumadin.   Assessment & Plan:   Active Problems:   Elevated INR   Vomiting   GI bleed   Acquired pyloric stenosis   LFT elevation  Nausea and vomiting/upper GI bleed/hematemesis/acute blood loss anemia/pyloric stenosis: Hemoglobin dropped from 9.9 at the time of admission to 6.4 on 11/26/2020 for which patient received 1 unit of PRBC transfusion.  She has not had any further  hematemesis.  Status post EGD today 11/27/2020 which shows pyloric stenosis.  This was dilated to 10 mm.  No other source of bleeding.  Hemoglobin is stable.  Continue twice daily Protonix and monitor H&H every 12 hours.  Reglan discontinued by GI.  Tolerating soft diet now.  History of mechanical mitral and tricuspid valve: Due to significant cardiac history and the need for keeping her INR in therapeutic range and now that it is highly contraindicated due to active GI bleeding, cardiology was consulted.  They have recommended that patient must be anticoagulated unless she has life-threatening hemorrhage and recommended to start her on heparin if her INR falls less than 2.5.  Her INR is 3 now.  Discontinued heparin this morning.  Severe hypokalemia: Resolved.  Hyperkalemia: Resolved.  Hypomagnesemia: Resolved.  Hypophosphatemia: Resolved.  Mild AKI: Resolved again.  Continue to hold diuretics since she looks dry on examination.  Elevated LFTs: -Elevated AST/ALT ratio to implying active drinking.  However patient denied. -RUQ ultrasound showed fatty liver similar as last year.  LFTs improving.  No abdominal pain or tenderness.  History of pyloric stenosis -As above.  History of alcohol abuse -No symptoms signs of acute withdrawal, CIWA protocol with as needed benzos.  Alcohol level within normal range.  -Diastolic CHF chronic: Looks euvolemic.  Now recovering from AKI.  Hold diuretic for now.  Reassess tomorrow.  History of stroke: Patient tells me that she had a stroke during Thanksgiving 2021 and since then she has been  having slurred speech.  Marijuana abuse: Counseled.  Right lower extremity weakness: Patient complained of right lower extremity weakness since about 1 to 2 weeks.  She also complains of low back pain which also started about 2 weeks ago.  She tells me that she had lumbar puncture done during Thanksgiving when she had stroke.  Her history is very vague.  On  examination, she does have positive straight leg raise test.  CT lumbar spine did not show any acute pathology however it shows some suspicion of possible [place] mass and radiology recommends CT abdomen and pelvis with IV and oral contrast which was done and ruled out any mass.  I wonder if she has been having this weakness since her stroke 6 months ago.  Her history is very vague.  No other etiology of sudden right lower extremity is found.  She refused to work with PT yesterday.  I have sent a message to PT to assess her.  Now she is complaining of bilateral leg pain.  Requesting something for pain.  We will provide her with oxycodone 5 mg every 6 hours as needed.  Continue Tylenol.  Her INR has remained therapeutic.  Doubt DVT.  DVT prophylaxis:    Code Status: Full Code  Family Communication:  None present at bedside.  Plan of care discussed with patient in length and he verbalized understanding and agreed with it.  Status is: Inpatient  Remains inpatient appropriate because:Ongoing diagnostic testing needed not appropriate for outpatient work up   Dispo: The patient is from: Home              Anticipated d/c is to: Home              Patient currently is not medically stable to d/c.   Difficult to place patient No        Estimated body mass index is 22.13 kg/m as calculated from the following:   Height as of this encounter: 5\' 2"  (1.575 m).   Weight as of this encounter: 54.9 kg.      Nutritional status:               Consultants:   GI  Procedures:   None  Antimicrobials:  Anti-infectives (From admission, onward)   None         Subjective: Seen and examined.  Once again complains of " everything hurts" especially both legs.  No shortness of breath or any other complaint.  Objective: Vitals:   11/28/20 0005 11/28/20 0447 11/28/20 2158 11/29/20 0619  BP:  118/75 123/70 126/77  Pulse: 97 (!) 102 86 89  Resp:   16 16  Temp:  98.5 F (36.9 C) 98.7 F  (37.1 C) 98.5 F (36.9 C)  TempSrc:  Oral Oral Oral  SpO2:  100% 100% 99%  Weight:      Height:        Intake/Output Summary (Last 24 hours) at 11/29/2020 1336 Last data filed at 11/29/2020 0359 Gross per 24 hour  Intake 164.64 ml  Output -  Net 164.64 ml   Filed Weights   11/24/20 1144  Weight: 54.9 kg    Examination:  General exam: Appears older than stated age and chronically sick. Respiratory system: Clear to auscultation. Respiratory effort normal. Cardiovascular system: S1 & S2 heard, RRR. No JVD, murmurs, rubs, gallops or clicks. No pedal edema. Gastrointestinal system: Abdomen is nondistended, soft and nontender. No organomegaly or masses felt. Normal bowel sounds heard. Central nervous system: Alert  and oriented. No focal neurological deficits. Extremities: Symmetric 5 x 5 power.  Tender to touch at most places. Skin: No rashes, lesions or ulcers.  Psychiatry: Judgement and insight appear normal. Mood & affect flat Data Reviewed: I have personally reviewed following labs and imaging studies  CBC: Recent Labs  Lab 11/24/20 1145 11/24/20 2218 11/25/20 2347 11/26/20 2108 11/27/20 1529 11/28/20 0110 11/29/20 0358  WBC 7.6   < > 4.5 6.2 6.4 7.8 5.6  NEUTROABS 5.6  --   --   --  4.4  --   --   HGB 9.9*   < > 6.4* 9.6* 9.2* 9.0* 7.9*  HCT 28.0*   < > 16.3* 27.0* 26.3* 24.9* 23.1*  MCV 120.7*   < > 119.0* 109.3* 108.7* 107.3* 109.0*  PLT 367   < > 233 265 241 227 241   < > = values in this interval not displayed.   Basic Metabolic Panel: Recent Labs  Lab 11/24/20 1656 11/25/20 0835 11/25/20 2347 11/27/20 0325 11/28/20 0110 11/29/20 0358  NA 143 142 140 135 137 139  K 3.0* 3.3* 2.6* 5.1 5.4* 4.6  CL 113* 115* 117* 111 114* 113*  CO2 17* 17* 16* 18* 17* 18*  GLUCOSE 77 83 70 97 73 75  BUN 6 7 5* 6 6 5*  CREATININE 1.08* 0.89 0.75 1.13* 0.88 0.78  CALCIUM 8.4* 7.8* 6.9* 8.5* 8.5* 8.2*  MG 1.6* 1.4* 1.1* 1.9 1.7  --   PHOS 2.8 2.1*  --   --  3.2  --     GFR: Estimated Creatinine Clearance: 60.6 mL/min (by C-G formula based on SCr of 0.78 mg/dL). Liver Function Tests: Recent Labs  Lab 11/24/20 1656 11/25/20 0835 11/25/20 2347 11/27/20 0325 11/28/20 0110  AST 214* 97* 38 35 32  ALT 99* 74* 45* 48* 38  ALKPHOS 174* 146* 97 149* 129*  BILITOT 0.9 0.8 0.9 1.2 0.9  PROT 5.7* 4.7* 3.7* 5.1* 5.0*  ALBUMIN 2.5* 2.1* 1.6* 2.3* 2.2*   Recent Labs  Lab 11/24/20 1331  LIPASE 20   No results for input(s): AMMONIA in the last 168 hours. Coagulation Profile: Recent Labs  Lab 11/25/20 2347 11/27/20 0325 11/27/20 1323 11/28/20 0110 11/29/20 0358  INR 2.5* 2.1* 1.8* 2.1* 3.0*   Cardiac Enzymes: No results for input(s): CKTOTAL, CKMB, CKMBINDEX, TROPONINI in the last 168 hours. BNP (last 3 results) No results for input(s): PROBNP in the last 8760 hours. HbA1C: No results for input(s): HGBA1C in the last 72 hours. CBG: Recent Labs  Lab 11/24/20 1141  GLUCAP 102*   Lipid Profile: No results for input(s): CHOL, HDL, LDLCALC, TRIG, CHOLHDL, LDLDIRECT in the last 72 hours. Thyroid Function Tests: No results for input(s): TSH, T4TOTAL, FREET4, T3FREE, THYROIDAB in the last 72 hours. Anemia Panel: No results for input(s): VITAMINB12, FOLATE, FERRITIN, TIBC, IRON, RETICCTPCT in the last 72 hours. Sepsis Labs: No results for input(s): PROCALCITON, LATICACIDVEN in the last 168 hours.  Recent Results (from the past 240 hour(s))  SARS CORONAVIRUS 2 (TAT 6-24 HRS) Nasopharyngeal Nasopharyngeal Swab     Status: None   Collection Time: 11/24/20 10:18 PM   Specimen: Nasopharyngeal Swab  Result Value Ref Range Status   SARS Coronavirus 2 NEGATIVE NEGATIVE Final    Comment: (NOTE) SARS-CoV-2 target nucleic acids are NOT DETECTED.  The SARS-CoV-2 RNA is generally detectable in upper and lower respiratory specimens during the acute phase of infection. Negative results do not preclude SARS-CoV-2 infection, do not rule out co-infections  with other pathogens, and should not be used as the sole basis for treatment or other patient management decisions. Negative results must be combined with clinical observations, patient history, and epidemiological information. The expected result is Negative.  Fact Sheet for Patients: SugarRoll.be  Fact Sheet for Healthcare Providers: https://www.woods-mathews.com/  This test is not yet approved or cleared by the [place] FDA and  has been authorized for detection and/or diagnosis of SARS-CoV-2 by FDA under an Emergency Use Authorization (EUA). This EUA will remain  in effect (meaning this test can be used) for the duration of the COVID-19 declaration under Se ction 564(b)(1) of the Act, 21 U.S.C. section 360bbb-3(b)(1), unless the authorization is terminated or revoked sooner.  Performed at [facility], [place] [address] [place], [place] [address]       Radiology Studies: No results found.  Scheduled Meds: . folic acid  1 mg Oral Daily  . gabapentin  100 mg Oral Daily   And  . gabapentin  400 mg Oral QHS  . multivitamin with minerals  1 tablet Oral Daily  . pantoprazole  40 mg Oral BID  . sodium chloride flush  10-40 mL Intracatheter Q12H  . thiamine  100 mg Oral Daily  . vitamin B-12  500 mcg Oral Daily  . warfarin  2.5 mg Oral ONCE-1600  . Warfarin - Pharmacist Dosing Inpatient   Does not apply q1600   Continuous Infusions:    LOS: 5 days   Time spent: 27 -minute   Darliss Cheney, MD [facility]  11/29/2020, 1:36 PM   To contact the attending provider between 7A-7P or the covering provider during after hours 7P-7A, please log into the web site [email or web address].

## 2020-11-29 NOTE — TOC Initial Note (Signed)
Transition of Care [facility]) - Initial/Assessment Note    Patient Details  Name: Mary [name] MRN: [id number] Date of Birth: 1961-03-31  [facility]) CM/SW Contact:    Coralee Pesa, [place] Phone Number: 11/29/2020, 4:00 PM  Clinical Narrative:                  CSW spoke with pt over the phone to discuss recommendation for SNF. Pt is tearful about going, but notes she does not have a choice, as her apartment has two flights of stairs. She at first asked for CIR, CSW noted that the recommendation was for SNF, and she agreed to be faxed out in the area. She noted that she had both Pfizer vaccines but no booster. Pt did not consent to CSW speaking with any family members. CSW will complete workup and faxout. SW will continue to follow for DC planning.   Expected Discharge Plan: Skilled Nursing Facility Barriers to Discharge: SNF Pending bed offer,Inadequate or no insurance   Patient Goals and CMS Choice Patient states their goals for this hospitalization and ongoing recovery are:: Pt states she wants to return home when possible. CMS Medicare.gov Compare Post Acute Care list provided to:: Patient Choice offered to / list presented to : Patient  Expected Discharge Plan and Services Expected Discharge Plan: [place] Acute Care Choice: [place] Living arrangements for the past 2 months: Apartment                                      Prior Living Arrangements/Services Living arrangements for the past 2 months: Apartment Lives with:: Self Patient language and need for interpreter reviewed:: Yes Do you feel safe going back to the place where you live?: Yes      Need for Family Participation in Patient Care: Yes (Comment) Care giver support system in place?: No (comment)   Criminal Activity/Legal Involvement Pertinent to Current Situation/Hospitalization: No - Comment as needed  Activities of Daily Living Home Assistive  Devices/Equipment: Walker (specify type) ADL Screening (condition at time of admission) Patient's cognitive ability adequate to safely complete daily activities?: Yes Is the patient deaf or have difficulty hearing?: No Does the patient have difficulty seeing, even when wearing glasses/contacts?: No Does the patient have difficulty concentrating, remembering, or making decisions?: Yes Patient able to express need for assistance with ADLs?: Yes Does the patient have difficulty dressing or bathing?: No Independently performs ADLs?: Yes (appropriate for developmental age) Does the patient have difficulty walking or climbing stairs?: No Weakness of Legs: Both Weakness of Arms/Hands: None  Permission Sought/Granted Permission sought to share information with : Family Supports Permission granted to share information with : No              Emotional Assessment Appearance:: Appears stated age Attitude/Demeanor/Rapport: Lethargic Affect (typically observed): Tearful/Crying Orientation: : Oriented to Self,Oriented to Place,Oriented to  Time,Oriented to Situation Alcohol / Substance Use: Alcohol Use Psych Involvement: No (comment)  Admission diagnosis:  Weakness [R53.1] GI bleed [K92.2] Elevated INR [R79.1] LFT elevation [R79.89] Patient Active Problem List   Diagnosis Date Noted  . LFT elevation   . Acquired pyloric stenosis   . GI bleed 11/24/2020  . Alcoholic peripheral neuropathy ([place]) 09/01/2020  . Thiamin deficiency 09/01/2020  . Paresthesia   . Vitamin B12 deficiency 05/04/2020  . Vomiting 04/24/2020  . GERD (gastroesophageal  reflux disease)   . Marijuana abuse   . Macrocytic anemia 04/04/2019  . Intermittent complete heart block ([place])   . Syncope 03/28/2019  . Orthostatic hypotension 01/11/2017  . Elevated liver enzymes 01/11/2017  . Atypical chest pain 06/05/2016  . Elevated INR 01/10/2016  . TIA (transient ischemic attack) 09/13/2015  . CVA (cerebral vascular  accident) ([place]) 09/12/2015  . SOB (shortness of breath) 02/24/2014  . First degree heart block 06/17/2013  . (HFpEF) heart failure with preserved ejection fraction ([place]) 06/16/2013  . History of bacterial endocarditis 06/16/2013  . Warfarin anticoagulation 06/10/2013  . S/P mitral valve replacement with metallic valve 04/59/9774  . Femoral hernia 05/23/2013  . Mitral valve insufficiency 04/22/2013  . Tricuspid regurgitation 04/22/2013  . 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 SVT (supraventricular tachycardia) ([place]) 03/16/2013   PCP:  [name], MD Pharmacy:   [facility] [address] [place]), [place] [address] [place] [address] Phone: [phone] Fax: [phone]  [place], [place] - [address] [place] [address] Phone: [phone] Fax: [place], [place] [address] [place] [address] Phone: [phone] Fax: [phone]     Social Determinants of Health (SDOH) Interventions    Readmission Risk Interventions No flowsheet data found.

## 2020-11-30 ENCOUNTER — Telehealth: Payer: Medicaid Other | Admitting: Internal Medicine

## 2020-11-30 ENCOUNTER — Telehealth: Payer: Self-pay

## 2020-11-30 ENCOUNTER — Encounter (HOSPITAL_COMMUNITY): Payer: Self-pay | Admitting: Internal Medicine

## 2020-11-30 ENCOUNTER — Inpatient Hospital Stay (HOSPITAL_COMMUNITY): Payer: Medicaid Other

## 2020-11-30 LAB — CBC
HCT: 25.4 % — ABNORMAL LOW (ref 36.0–46.0)
Hemoglobin: 8.7 g/dL — ABNORMAL LOW (ref 12.0–15.0)
MCH: 37.2 pg — ABNORMAL HIGH (ref 26.0–34.0)
MCHC: 34.3 g/dL (ref 30.0–36.0)
MCV: 108.5 fL — ABNORMAL HIGH (ref 80.0–100.0)
Platelets: 304 10*3/uL (ref 150–400)
RBC: 2.34 MIL/uL — ABNORMAL LOW (ref 3.87–5.11)
RDW: 24.7 % — ABNORMAL HIGH (ref 11.5–15.5)
WBC: 5.8 10*3/uL (ref 4.0–10.5)
nRBC: 0 % (ref 0.0–0.2)

## 2020-11-30 LAB — PROTIME-INR
INR: 3.6 — ABNORMAL HIGH (ref 0.8–1.2)
Prothrombin Time: 34.6 seconds — ABNORMAL HIGH (ref 11.4–15.2)

## 2020-11-30 LAB — BASIC METABOLIC PANEL
Anion gap: 9 (ref 5–15)
BUN: <5 mg/dL — ABNORMAL LOW (ref 6–20)
CO2: 19 mmol/L — ABNORMAL LOW (ref 22–32)
Calcium: 8.4 mg/dL — ABNORMAL LOW (ref 8.9–10.3)
Chloride: 111 mmol/L (ref 98–111)
Creatinine, Ser: 0.93 mg/dL (ref 0.44–1.00)
GFR, Estimated: >60 mL/min (ref >60)
Glucose, Bld: 84 mg/dL (ref 70–99)
Potassium: 4.3 mmol/L (ref 3.5–5.1)
Sodium: 139 mmol/L (ref 135–145)

## 2020-11-30 MED ORDER — WARFARIN SODIUM 2.5 MG PO TABS
2.5000 mg | ORAL_TABLET | Freq: Once | ORAL | Status: AC
  Administered 2020-11-30: 2.5 mg via ORAL
  Filled 2020-11-30: qty 1

## 2020-11-30 MED ORDER — TRAZODONE HCL 50 MG PO TABS
25.0000 mg | ORAL_TABLET | Freq: Every evening | ORAL | Status: DC | PRN
  Administered 2020-11-30 – 2020-12-02 (×4): 25 mg via ORAL
  Filled 2020-11-30 (×4): qty 1

## 2020-11-30 NOTE — Progress Notes (Signed)
PROGRESS NOTE    [name]  NWG:[id number] DOB: 19-Dec-1960 DOA: 11/24/2020 PCP: [name], MD   Brief Narrative:  Mary [name] is a 60 y.o. 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 No abdominal pain, and she also has had a loose bowel movement for 5-day. She admitted to been using marijuana.  Upon arrival to ED, she was hemodynamically stable.  CT head was unremarkable. She had elevated LFTs for which ultrasound abdomen was done which showed fatty liver similar to last year.  She had mild AKI as well as supratherapeutic INR of 8.2.  She received 2.5 mg of oral vitamin K.  GI was consulted.  INR was still elevated over 7 the next day so she received another dose of vitamin K 5 mg IV.  INR dropped to 2.1.  Hemoglobin also dropped from 9.9 at the time of admission to 6.4 on 11/26/2020 for which patient received 1 unit of PRBC transfusion.  Due to complicated cardiac history, cardiology was consulted who recommended that patient should be on anticoagulation at all times unless there is a life-threatening emergency.  Due to subtherapeutic INR  (goal INR 2.5-3.5 ) patient was bridged with IV heparin.  She has not had any further hematemesis.  Status post EGD today 11/27/2020 which shows pyloric stenosis.  This was dilated by GI.  No other source of bleeding.  She was started on Coumadin.   Assessment & Plan:   Active Problems:   Elevated INR   Vomiting   GI bleed   Acquired pyloric stenosis   LFT elevation  Nausea and vomiting/upper GI bleed/hematemesis/acute blood loss anemia/pyloric stenosis: Hemoglobin dropped from 9.9 at the time of admission to 6.4 on 11/26/2020 for which patient received 1 unit of PRBC transfusion.  She has not had any further  hematemesis.  Status post EGD 11/27/2020 which shows pyloric stenosis.  This was dilated to 10 mm.  No other source of bleeding.  Hemoglobin is stable.  Continue twice daily Protonix and monitor CBC daily Reglan discontinued by GI.  Tolerating soft diet now.  History of mechanical mitral and tricuspid valve: Due to significant cardiac history and the need for keeping her INR in therapeutic range and now that it is highly contraindicated due to active GI bleeding, cardiology was consulted.  They have recommended that patient must be anticoagulated unless she has life-threatening hemorrhage and recommended to start her on heparin if her INR falls less than 2.5.  Her INR is over 3 now.  Severe hypokalemia: Resolved.  Hyperkalemia: Resolved.  Hypomagnesemia: Resolved.  Hypophosphatemia: Resolved.  Mild AKI: Resolved again.  Continue to hold diuretics since she looks dry on examination.  Elevated LFTs: -Elevated AST/ALT ratio to implying active drinking.  However patient denied. -RUQ ultrasound showed fatty liver similar as last year.  LFTs improving.  No abdominal pain or tenderness.  History of pyloric stenosis -As above.  History of alcohol abuse -No symptoms signs of acute withdrawal, CIWA protocol with as needed benzos.  Alcohol level within normal range.  -Diastolic CHF chronic: Looks euvolemic.  Now recovering from AKI.  Hold diuretic for now.  Reassess tomorrow.  History of stroke: Patient tells me that she had a stroke during Thanksgiving 2021 and since then she has been having slurred speech.  Marijuana abuse: Counseled.  Right lower extremity weakness: Patient complained of right lower extremity weakness since about 1 to 2 weeks.  She also complains of low back pain which also started about 2 weeks ago.  She tells me that she had lumbar puncture done during Thanksgiving when she had stroke.  Her history is very vague.  On examination, she does have positive straight leg raise  test.  CT lumbar spine did not show any acute pathology however it shows some suspicion of possible [place] mass and radiology recommends CT abdomen and pelvis with IV and oral contrast which was done and ruled out any mass.  I wonder if she has been having this weakness since her stroke 6 months ago.  Her history is very vague.  No other etiology of sudden right lower extremity is found.  She refused to work with PT yesterday.  I have sent a message to PT to assess her.  Now she is complaining of bilateral leg pain.  Requesting something for pain.  We will provide her with oxycodone 5 mg every 6 hours as needed.  Continue Tylenol.  Her INR has remained therapeutic.  Doubt DVT.  She is also complaining of right inguinal pain now.  We will proceed with CT right hip.  Seen by PT OT recommends SNF.However when she worked with PT, she exhibited bilateral lower extremity weakness, right more than the left.  TOC on board.  DVT prophylaxis:    Code Status: Full Code  Family Communication:  None present at bedside.  Plan of care discussed with patient in length and he verbalized understanding and agreed with it.  Status is: Inpatient  Remains inpatient appropriate because:Ongoing diagnostic testing needed not appropriate for outpatient work up   Dispo: The patient is from: Home              Anticipated d/c is to: SNF, TOC on board.              Patient currently is not medically stable to d/c.   Difficult to place patient No        Estimated body mass index is 22.13 kg/m as calculated from the following:   Height as of this encounter: 5\' 2"  (1.575 m).   Weight as of this encounter: 54.9 kg.      Nutritional status:               Consultants:   GI  Procedures:   None  Antimicrobials:  Anti-infectives (From admission, onward)   None         Subjective: Seen and examined.  Looks disappointed.  I had a lengthy discussion with her and asked her why.  She is just concerned  about her right lower extremity.  Objective: Vitals:   11/29/20 0619 11/29/20 1424 11/29/20 2032 11/30/20 0402  BP: 126/77 126/80 (!) 134/92 (!) 140/91  Pulse: 89 91 92 91  Resp: 16 17 18 17   Temp: 98.5 F (36.9 C) 98.3 F (36.8 C) 98.8 F (37.1 C) 98 F (36.7 C)  TempSrc: Oral  Oral Oral  SpO2: 99% 100%  100%  Weight:      Height:        Intake/Output Summary (Last 24 hours) at 11/30/2020 1350 Last data filed at 11/30/2020 0200 Gross per 24 hour  Intake --  Output 1 ml  Net -1 ml   Filed Weights   11/24/20 1144  Weight: 54.9 kg    Examination: General exam: Appears calm and comfortable  Respiratory  system: Clear to auscultation. Respiratory effort normal. Cardiovascular system: S1 & S2 heard, RRR. No JVD, murmurs, rubs, gallops or clicks. No pedal edema. Gastrointestinal system: Abdomen is nondistended, soft and nontender. No organomegaly or masses felt. Normal bowel sounds heard. Central nervous system: Alert and oriented. No focal neurological deficits.  Slight weakness on the right lower extremity but she has bilateral lower extremity weakness. Extremities: Symmetric 5 x 5 power. Skin: No rashes, lesions or ulcers.  Psychiatry: Judgement and insight appear normal. Mood & affect appropriate.    Data Reviewed: I have personally reviewed following labs and imaging studies  CBC: Recent Labs  Lab 11/24/20 1145 11/24/20 2218 11/26/20 2108 11/27/20 1529 11/28/20 0110 11/29/20 0358 11/30/20 0212  WBC 7.6   < > 6.2 6.4 7.8 5.6 5.8  NEUTROABS 5.6  --   --  4.4  --   --   --   HGB 9.9*   < > 9.6* 9.2* 9.0* 7.9* 8.7*  HCT 28.0*   < > 27.0* 26.3* 24.9* 23.1* 25.4*  MCV 120.7*   < > 109.3* 108.7* 107.3* 109.0* 108.5*  PLT 367   < > 265 241 227 241 304   < > = values in this interval not displayed.   Basic Metabolic Panel: Recent Labs  Lab 11/24/20 1656 11/25/20 0835 11/25/20 2347 11/27/20 0325 11/28/20 0110 11/29/20 0358 11/30/20 0212  NA 143 142 140 135 137  139 139  K 3.0* 3.3* 2.6* 5.1 5.4* 4.6 4.3  CL 113* 115* 117* 111 114* 113* 111  CO2 17* 17* 16* 18* 17* 18* 19*  GLUCOSE 77 83 70 97 73 75 84  BUN 6 7 5* 6 6 5* <5*  CREATININE 1.08* 0.89 0.75 1.13* 0.88 0.78 0.93  CALCIUM 8.4* 7.8* 6.9* 8.5* 8.5* 8.2* 8.4*  MG 1.6* 1.4* 1.1* 1.9 1.7  --   --   PHOS 2.8 2.1*  --   --  3.2  --   --    GFR: Estimated Creatinine Clearance: 52.2 mL/min (by C-G formula based on SCr of 0.93 mg/dL). Liver Function Tests: Recent Labs  Lab 11/24/20 1656 11/25/20 0835 11/25/20 2347 11/27/20 0325 11/28/20 0110  AST 214* 97* 38 35 32  ALT 99* 74* 45* 48* 38  ALKPHOS 174* 146* 97 149* 129*  BILITOT 0.9 0.8 0.9 1.2 0.9  PROT 5.7* 4.7* 3.7* 5.1* 5.0*  ALBUMIN 2.5* 2.1* 1.6* 2.3* 2.2*   Recent Labs  Lab 11/24/20 1331  LIPASE 20   No results for input(s): AMMONIA in the last 168 hours. Coagulation Profile: Recent Labs  Lab 11/27/20 0325 11/27/20 1323 11/28/20 0110 11/29/20 0358 11/30/20 0212  INR 2.1* 1.8* 2.1* 3.0* 3.6*   Cardiac Enzymes: No results for input(s): CKTOTAL, CKMB, CKMBINDEX, TROPONINI in the last 168 hours. BNP (last 3 results) No results for input(s): PROBNP in the last 8760 hours. HbA1C: No results for input(s): HGBA1C in the last 72 hours. CBG: Recent Labs  Lab 11/24/20 1141  GLUCAP 102*   Lipid Profile: No results for input(s): CHOL, HDL, LDLCALC, TRIG, CHOLHDL, LDLDIRECT in the last 72 hours. Thyroid Function Tests: No results for input(s): TSH, T4TOTAL, FREET4, T3FREE, THYROIDAB in the last 72 hours. Anemia Panel: No results for input(s): VITAMINB12, FOLATE, FERRITIN, TIBC, IRON, RETICCTPCT in the last 72 hours. Sepsis Labs: No results for input(s): PROCALCITON, LATICACIDVEN in the last 168 hours.  Recent Results (from the past 240 hour(s))  SARS CORONAVIRUS 2 (TAT 6-24 HRS) Nasopharyngeal Nasopharyngeal Swab  Status: None   Collection Time: 11/24/20 10:18 PM   Specimen: Nasopharyngeal Swab  Result Value  Ref Range Status   SARS Coronavirus 2 NEGATIVE NEGATIVE Final    Comment: (NOTE) SARS-CoV-2 target nucleic acids are NOT DETECTED.  The SARS-CoV-2 RNA is generally detectable in upper and lower respiratory specimens during the acute phase of infection. Negative results do not preclude SARS-CoV-2 infection, do not rule out co-infections with other pathogens, and should not be used as the sole basis for treatment or other patient management decisions. Negative results must be combined with clinical observations, patient history, and epidemiological information. The expected result is Negative.  Fact Sheet for Patients: SugarRoll.be  Fact Sheet for Healthcare Providers: https://www.woods-mathews.com/  This test is not yet approved or cleared by the [place] FDA and  has been authorized for detection and/or diagnosis of SARS-CoV-2 by FDA under an Emergency Use Authorization (EUA). This EUA will remain  in effect (meaning this test can be used) for the duration of the COVID-19 declaration under Se ction 564(b)(1) of the Act, 21 U.S.C. section 360bbb-3(b)(1), unless the authorization is terminated or revoked sooner.  Performed at [facility], [place] [address], [place], [place] [address]       Radiology Studies: No results found.  Scheduled Meds: . folic acid  1 mg Oral Daily  . gabapentin  100 mg Oral Daily   And  . gabapentin  400 mg Oral QHS  . multivitamin with minerals  1 tablet Oral Daily  . pantoprazole  40 mg Oral BID  . sodium chloride flush  10-40 mL Intracatheter Q12H  . thiamine  100 mg Oral Daily  . vitamin B-12  500 mcg Oral Daily  . warfarin  2.5 mg Oral ONCE-1600  . Warfarin - Pharmacist Dosing Inpatient   Does not apply q1600   Continuous Infusions:    LOS: 6 days   Time spent: 29 -minute   Darliss Cheney, MD [facility]  11/30/2020, 1:50 PM   To contact the attending provider between 7A-7P  or the covering provider during after hours 7P-7A, please log into the web site [email or web address].

## 2020-11-30 NOTE — Telephone Encounter (Signed)
Call placed to [facility] [place].  He was inquiring about contact information for Anner [place]te [place]/[employer].  Informed him that this CM sent the information to [place] and he said that he just received the information from [place] and has already sent an email to Ms Huel Cote.    Aline Brochure also noted that the patient is on a wait list for project based voucher with [employer].  He explained that without an income or insurance,the options for housing for the patient are very limited.

## 2020-11-30 NOTE — TOC Progression Note (Signed)
Transition of Care [facility]) - Progression Note    Patient Details  Name: Mary [name] MRN: [id number] Date of Birth: 1961-08-29  [facility]) CM/SW Contact  Joanne Chars, LCSW Phone Number: 11/30/2020, 2:29 PM  Clinical Narrative:  CSW spoke with pt about her current situation.  Pt reports she lives in an apartment at [facility] apartments.  Pt reports she has lived there since 2011 and has no income, reports [employer] and [employer] have helped her pay her rent.  Pt is applying for disability but it is not in place.  CSW asked about Adolm Joseph, as there are notes that attempts are being made to contact Margaree Mackintosh.  Pt reports that she is being evicted and was told Margaree Mackintosh could help. She is not sure how much longer she can stay in her apartment. Pt has two sons, one lives in [place] and helps with groceries and transportation, but pt cannot stay with him.  The other son is coming to [facility] on 12/04/20 to help her, but he does not have income or the ability to pay rent.  Pt declined to give permission for CSW to speak to either of her sons despite CSW asking several times.  CSW informed her that SNF placement is unlikely with no insurance coverage.       Expected Discharge Plan: Skilled Nursing Facility Barriers to Discharge: SNF Pending bed offer,Inadequate or no insurance  Expected Discharge Plan and Services Expected Discharge Plan: [place] Choice: [place] arrangements for the past 2 months: Apartment                                       Social Determinants of Health (SDOH) Interventions    Readmission Risk Interventions No flowsheet data found.

## 2020-11-30 NOTE — Progress Notes (Signed)
ANTICOAGULATION CONSULT NOTE - Follow Up Consult  Pharmacy Consult for Warfarin Indication: mechanical valve   Patient Measurements: Height: 5\' 2"  (157.5 cm) Weight: 54.9 kg (121 lb) IBW/kg (Calculated) : 50.1  Vital Signs: Temp: 98 F (36.7 C) (03/07 0402) Temp Source: Oral (03/07 0402) BP: 140/91 (03/07 0402) Pulse Rate: 91 (03/07 0402)  Labs: Recent Labs    11/28/20 0110 11/28/20 0131 11/28/20 1313 11/28/20 1948 11/29/20 0358 11/29/20 0359 11/30/20 0212  HGB 9.0*  --   --   --  7.9*  --  8.7*  HCT 24.9*  --   --   --  23.1*  --  25.4*  PLT 227  --   --   --  241  --  304  LABPROT 22.5*  --   --   --  29.8*  --  34.6*  INR 2.1*  --   --   --  3.0*  --  3.6*  HEPARINUNFRC  --    < > 0.18* 0.29*  --  0.57  --   CREATININE 0.88  --   --   --  0.78  --  0.93   < > = values in this interval not displayed.    Estimated Creatinine Clearance: 52.2 mL/min (by C-G formula based on SCr of 0.93 mg/dL).  Assessment: 60 year old female who presented with hematemesis and supra-therapeutic INR after "loading" herself with warfarin.  Received Vitamin K 5 mg IV and 2.5 mg po 3/2 with resulting INR <2.  EGD 3/4 showed pyloric stenosis but no active bleeding.  Anticoagulation resumed and patient was bridged with heparin to warfarin.  Heparin stopped 3/6 with therapeutic INR.   INR rising quickly 2.1 > 3 > 3.6  PTA Dose: 2.5 mg TThSSu and 5 mg AOD   Goal of Therapy:  Heparin level 0.3-0.5 units/ml Monitor platelets by anticoagulation protocol: Yes  INR goal 2.5 to 3.5   Plan:  Warfarin 2.5 mg x1 today  Monitor Hgb and s/sx of bleed with Hgb trending down  Daily INR and CBC  Kimberly Hammons, Pharm.D., BCPS Clinical Pharmacist Clinical phone for 11/30/2020 from 8:30-4:00 is [phone].  **Pharmacist phone directory can be found on [email or web address] listed under [place].  11/30/2020 9:33 AM

## 2020-11-30 NOTE — Telephone Encounter (Signed)
Mary Flock, MD  Yevette Edwards, RN  [place] can you please contact the patient after her discharge and coordinate follow up with me or APP in the next 3 weeks or so? Thank you    Spoke with patient, she is scheduled for a follow up with Dr. Havery Moros on Tuesday, 12/22/20 at 8:10 AM. Patient is aware that I will mail this appointment information to her home, she confirmed address on file. Patient verbalized understanding and had no concerns at the end of the call.

## 2020-12-01 ENCOUNTER — Inpatient Hospital Stay (HOSPITAL_COMMUNITY): Payer: Medicaid Other

## 2020-12-01 LAB — PROTIME-INR
INR: 3.4 — ABNORMAL HIGH (ref 0.8–1.2)
INR: 1.8 — ABNORMAL HIGH (ref 0.8–1.2)
Prothrombin Time: 33 seconds — ABNORMAL HIGH (ref 11.4–15.2)
Prothrombin Time: 20.1 seconds — ABNORMAL HIGH (ref 11.4–15.2)

## 2020-12-01 LAB — CBC
HCT: 23.8 % — ABNORMAL LOW (ref 36.0–46.0)
Hemoglobin: 8.4 g/dL — ABNORMAL LOW (ref 12.0–15.0)
MCH: 38.2 pg — ABNORMAL HIGH (ref 26.0–34.0)
MCHC: 35.3 g/dL (ref 30.0–36.0)
MCV: 108.2 fL — ABNORMAL HIGH (ref 80.0–100.0)
Platelets: 273 10*3/uL (ref 150–400)
RBC: 2.2 MIL/uL — ABNORMAL LOW (ref 3.87–5.11)
RDW: 23.8 % — ABNORMAL HIGH (ref 11.5–15.5)
WBC: 6.5 10*3/uL (ref 4.0–10.5)
nRBC: 0 % (ref 0.0–0.2)

## 2020-12-01 MED ORDER — SODIUM CHLORIDE (PF) 0.9 % IJ SOLN
INTRAMUSCULAR | Status: AC
  Administered 2020-12-02: 10 mL
  Filled 2020-12-01: qty 10

## 2020-12-01 MED ORDER — WARFARIN SODIUM 5 MG PO TABS
5.0000 mg | ORAL_TABLET | Freq: Once | ORAL | Status: AC
  Administered 2020-12-01: 5 mg via ORAL
  Filled 2020-12-01: qty 1

## 2020-12-01 MED ORDER — IOHEXOL 300 MG/ML  SOLN
10.0000 mL | Freq: Once | INTRAMUSCULAR | Status: AC | PRN
  Administered 2020-12-01: 1 mL via INTRA_ARTICULAR

## 2020-12-01 MED ORDER — SODIUM CHLORIDE 0.9% IV SOLUTION: Freq: Once | INTRAVENOUS | Status: AC

## 2020-12-01 MED ORDER — FENTANYL CITRATE (PF) 100 MCG/2ML IJ SOLN
12.5000 ug | INTRAMUSCULAR | Status: DC | PRN
  Administered 2020-12-01 – 2020-12-02 (×4): 25 ug via INTRAVENOUS
  Administered 2020-12-02 – 2020-12-03 (×3): 12.5 ug via INTRAVENOUS
  Filled 2020-12-01 (×9): qty 2

## 2020-12-01 MED ORDER — LIDOCAINE HCL (PF) 1 % IJ SOLN
5.0000 mL | Freq: Once | INTRAMUSCULAR | Status: AC
  Administered 2020-12-01: 3 mL via SUBCUTANEOUS

## 2020-12-01 NOTE — Progress Notes (Signed)
PROGRESS NOTE    Mary [name]  KVQ:[id number] DOB: 1961/06/26 DOA: 11/24/2020 PCP: [name], MD   Brief Narrative:  Mary [name] is a 60 y.o. 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 No abdominal pain, and she also has had a loose bowel movement for 5-day. She admitted to been using marijuana.  Upon arrival to ED, she was hemodynamically stable.  CT head was unremarkable. She had elevated LFTs for which ultrasound abdomen was done which showed fatty liver similar to last year.  She had mild AKI as well as supratherapeutic INR of 8.2.  She received 2.5 mg of oral vitamin K.  GI was consulted.  INR was still elevated over 7 the next day so she received another dose of vitamin K 5 mg IV.  INR dropped to 2.1.  Hemoglobin also dropped from 9.9 at the time of admission to 6.4 on 11/26/2020 for which patient received 1 unit of PRBC transfusion.  Due to complicated cardiac history, cardiology was consulted who recommended that patient should be on anticoagulation at all times unless there is a life-threatening emergency.  Due to subtherapeutic INR  (goal INR 2.5-3.5 ) patient was bridged with IV heparin.  She has not had any further hematemesis.  Status post EGD 11/27/2020 which shows pyloric stenosis.  This was dilated by GI up to 10 mm.  No other source of bleeding.  She was started back on Coumadin with heparin drip as bridge.  INR therapeutic, heparin discontinued.  She was seen by PT OT recommended SNF.  Continue to complain of persistent right hip pain for which CT right hip was done which shows joint effusion.  Discussed with Dr. Jeannine Kitten of orthopedics who recommended consulting IR for arthrocentesis and labs ordered.   Assessment & Plan:   Active  Problems:   Elevated INR   Vomiting   GI bleed   Acquired pyloric stenosis   LFT elevation  Nausea and vomiting/upper GI bleed/hematemesis/acute blood loss anemia/pyloric stenosis: Hemoglobin dropped from 9.9 at the time of admission to 6.4 on 11/26/2020 for which patient received 1 unit of PRBC transfusion.  She has not had any further hematemesis.  Status post EGD 11/27/2020 which shows pyloric stenosis.  This was dilated to 10 mm.  No other source of bleeding.  Hemoglobin is stable.  Continue twice daily Protonix and monitor CBC daily Reglan discontinued by GI.  Tolerating soft diet now.  History of mechanical mitral and tricuspid valve: Due to significant cardiac history and the need for keeping her INR in therapeutic range and now that it is highly contraindicated due to active GI bleeding, cardiology was consulted.  They have recommended that patient must be anticoagulated unless she has life-threatening hemorrhage and recommended to start her on heparin if her INR falls less than 2.5.  Her INR is 3.4.  Severe hypokalemia: Resolved.  Hyperkalemia: Resolved.  Hypomagnesemia: Resolved.  Hypophosphatemia: Resolved.  Mild AKI: Resolved again.  Continue to hold diuretics since she looks dry on examination.  Elevated LFTs: -Elevated AST/ALT ratio to implying active drinking.  However patient denied. -RUQ ultrasound showed fatty liver similar as last year.  LFTs improving.  No abdominal pain or tenderness.  History of pyloric stenosis -As above.  History of alcohol abuse -No symptoms signs of acute withdrawal, discontinue CIWA.  -  Diastolic CHF chronic: Looks euvolemic.  Now recovering from AKI.  Hold diuretic for now.  Reassess tomorrow.  History of stroke: Patient tells me that she had a stroke during Thanksgiving 2021 and since then she has been having slurred speech.  Marijuana abuse: Counseled.  Right lower extremity weakness: Patient complained of right lower extremity  weakness since about 1 to 2 weeks.  She also complains of low back pain which also started about 2 weeks ago.  She tells me that she had lumbar puncture done during Thanksgiving when she had stroke.  Her history is very vague.  On examination, she does have positive straight leg raise test.  CT lumbar spine did not show any acute pathology however it shows some suspicion of possible [place] mass and radiology recommends CT abdomen and pelvis with IV and oral contrast which was done and ruled out any mass.  I wonder if she has been having this weakness since her stroke 6 months ago.  Her history is very vague.  No other etiology of sudden right lower extremity is found.  PT recommends SNF.  Patient continued to complain of right hip pain so CT right hip was obtained yesterday which shows right hip effusion.  Discussed with orthopedic/Dr. Alvan Dame who recommended consulting IR for arthrocentesis.  Recommended ordering cell count, fluid culture and Gram stain.  Discussed with IR/Dr. Luther Parody, unable to do arthrocentesis with her high INR.  Request to bring it less than 3.  Ordered 2 units of FFP's to be given instead.  Which are completed.  Repeat INR pending.  Radiology to follow-up on INR and if less than 3, perform arthrocentesis today.  Orthopedics to see later.  DVT prophylaxis:    Code Status: Full Code  Family Communication:  None present at bedside.  Plan of care discussed with patient in length and he verbalized understanding and agreed with it.  Status is: Inpatient  Remains inpatient appropriate because:Ongoing diagnostic testing needed not appropriate for outpatient work up   Dispo: The patient is from: Home              Anticipated d/c is to: SNF, TOC on board.              Patient currently is not medically stable to d/c.   Difficult to place patient No        Estimated body mass index is 22.13 kg/m as calculated from the following:   Height as of this encounter: 5\' 2"  (1.575 m).    Weight as of this encounter: 54.9 kg.      Nutritional status:               Consultants:   GI  Procedures:   None  Antimicrobials:  Anti-infectives (From admission, onward)   None         Subjective: Seen and examined.  Still complains of right lower extremity pain.  7 out of 10.  No other complaint.  Had a lengthy discussion with her today about family support.  She tells me that her younger son is coming to live with her on 12/04/2020 however she would not review with me about his whereabouts now.  She also has an older son who lives separately but she tells me that he will be available if needed.  She also has a mother and sister who lives in [place] but she states that they will also be willing to drive here and come help if needed.  I did  inform her that due to her not having any insurance, SNF might not be an option for her and that if we do not find any signs of infection in the right hip joint fluid, she may be discharged home with home health in next 1 to 2 days.  She is agreeable to that.  Objective: Vitals:   12/01/20 0520 12/01/20 1203 12/01/20 1216 12/01/20 1245  BP: (!) 133/96 133/84 (!) 155/84 125/77  Pulse: (!) 105 93 91 92  Resp: 20 18 (!) 22 19  Temp: 97.8 F (36.6 C) 98.3 F (36.8 C) 98.1 F (36.7 C) 98.3 F (36.8 C)  TempSrc: Oral Oral Oral Oral  SpO2: 100%  100%   Weight:      Height:        Intake/Output Summary (Last 24 hours) at 12/01/2020 1300 Last data filed at 12/01/2020 1245 Gross per 24 hour  Intake 590 ml  Output --  Net 590 ml   Filed Weights   11/24/20 1144  Weight: 54.9 kg    Examination:  General exam: Appears calm and comfortable  Respiratory system: Clear to auscultation. Respiratory effort normal. Cardiovascular system: S1 & S2 heard, RRR. No JVD, murmurs, rubs, gallops or clicks. No pedal edema. Gastrointestinal system: Abdomen is nondistended, soft and nontender. No organomegaly or masses felt. Normal bowel  sounds heard. Central nervous system: Alert and oriented. No focal neurological deficits. Extremities: Positive right leg raise test Skin: No rashes, lesions or ulcers.  Psychiatry: Judgement and insight appear normal. Mood & affect appropriate.   Data Reviewed: I have personally reviewed following labs and imaging studies  CBC: Recent Labs  Lab 11/27/20 1529 11/28/20 0110 11/29/20 0358 11/30/20 0212 12/01/20 0107  WBC 6.4 7.8 5.6 5.8 6.5  NEUTROABS 4.4  --   --   --   --   HGB 9.2* 9.0* 7.9* 8.7* 8.4*  HCT 26.3* 24.9* 23.1* 25.4* 23.8*  MCV 108.7* 107.3* 109.0* 108.5* 108.2*  PLT 241 227 241 304 631   Basic Metabolic Panel: Recent Labs  Lab 11/24/20 1656 11/25/20 0835 11/25/20 2347 11/27/20 0325 11/28/20 0110 11/29/20 0358 11/30/20 0212  NA 143 142 140 135 137 139 139  K 3.0* 3.3* 2.6* 5.1 5.4* 4.6 4.3  CL 113* 115* 117* 111 114* 113* 111  CO2 17* 17* 16* 18* 17* 18* 19*  GLUCOSE 77 83 70 97 73 75 84  BUN 6 7 5* 6 6 5* <5*  CREATININE 1.08* 0.89 0.75 1.13* 0.88 0.78 0.93  CALCIUM 8.4* 7.8* 6.9* 8.5* 8.5* 8.2* 8.4*  MG 1.6* 1.4* 1.1* 1.9 1.7  --   --   PHOS 2.8 2.1*  --   --  3.2  --   --    GFR: Estimated Creatinine Clearance: 52.2 mL/min (by C-G formula based on SCr of 0.93 mg/dL). Liver Function Tests: Recent Labs  Lab 11/24/20 1656 11/25/20 0835 11/25/20 2347 11/27/20 0325 11/28/20 0110  AST 214* 97* 38 35 32  ALT 99* 74* 45* 48* 38  ALKPHOS 174* 146* 97 149* 129*  BILITOT 0.9 0.8 0.9 1.2 0.9  PROT 5.7* 4.7* 3.7* 5.1* 5.0*  ALBUMIN 2.5* 2.1* 1.6* 2.3* 2.2*   Recent Labs  Lab 11/24/20 1331  LIPASE 20   No results for input(s): AMMONIA in the last 168 hours. Coagulation Profile: Recent Labs  Lab 11/27/20 1323 11/28/20 0110 11/29/20 0358 11/30/20 0212 12/01/20 0107  INR 1.8* 2.1* 3.0* 3.6* 3.4*   Cardiac Enzymes: No results for input(s): CKTOTAL, CKMB,  CKMBINDEX, TROPONINI in the last 168 hours. BNP (last 3 results) No results for  input(s): PROBNP in the last 8760 hours. HbA1C: No results for input(s): HGBA1C in the last 72 hours. CBG: No results for input(s): GLUCAP in the last 168 hours. Lipid Profile: No results for input(s): CHOL, HDL, LDLCALC, TRIG, CHOLHDL, LDLDIRECT in the last 72 hours. Thyroid Function Tests: No results for input(s): TSH, T4TOTAL, FREET4, T3FREE, THYROIDAB in the last 72 hours. Anemia Panel: No results for input(s): VITAMINB12, FOLATE, FERRITIN, TIBC, IRON, RETICCTPCT in the last 72 hours. Sepsis Labs: No results for input(s): PROCALCITON, LATICACIDVEN in the last 168 hours.  Recent Results (from the past 240 hour(s))  SARS CORONAVIRUS 2 (TAT 6-24 HRS) Nasopharyngeal Nasopharyngeal Swab     Status: None   Collection Time: 11/24/20 10:18 PM   Specimen: Nasopharyngeal Swab  Result Value Ref Range Status   SARS Coronavirus 2 NEGATIVE NEGATIVE Final    Comment: (NOTE) SARS-CoV-2 target nucleic acids are NOT DETECTED.  The SARS-CoV-2 RNA is generally detectable in upper and lower respiratory specimens during the acute phase of infection. Negative results do not preclude SARS-CoV-2 infection, do not rule out co-infections with other pathogens, and should not be used as the sole basis for treatment or other patient management decisions. Negative results must be combined with clinical observations, patient history, and epidemiological information. The expected result is Negative.  Fact Sheet for Patients: SugarRoll.be  Fact Sheet for Healthcare Providers: https://www.woods-mathews.com/  This test is not yet approved or cleared by the [place] FDA and  has been authorized for detection and/or diagnosis of SARS-CoV-2 by FDA under an Emergency Use Authorization (EUA). This EUA will remain  in effect (meaning this test can be used) for the duration of the COVID-19 declaration under Se ction 564(b)(1) of the Act, 21 U.S.C. section  360bbb-3(b)(1), unless the authorization is terminated or revoked sooner.  Performed at [facility], [place] [address] [place], [place] [address]       Radiology Studies: CT HIP RIGHT WO CONTRAST  Result Date: 12/01/2020 CLINICAL DATA:  Right hip pain.  Question infection. EXAM: CT OF THE RIGHT HIP WITHOUT CONTRAST TECHNIQUE: Multidetector CT imaging of the right hip was performed according to the standard protocol. Multiplanar CT image reconstructions were also generated. COMPARISON:  CT abdomen and pelvis 11/26/2020. FINDINGS: Bones/Joint/Cartilage There is no acute bony abnormality. No bony destructive change or periosteal reaction. Hip joint space is preserved with minimal subchondral cyst formation in the periphery of the acetabulum consistent with mild degenerative change noted. The patient has a moderate right hip joint effusion which was present on the prior exam. Ligaments Suboptimally assessed by CT. Muscles and Tendons No intramuscular fluid collection or mass. No evidence of tear strain. Soft tissues There is edema in subcutaneous fatty tissues about the hip. Imaged intrapelvic contents are unremarkable. IMPRESSION: Moderate right hip effusion could be septic or aseptic. As there is clinical concern for infection, right hip joint aspiration is recommended. No bony findings to suggest osteomyelitis. Electronically Signed   By: Inge Rise M.D.   On: 12/01/2020 08:34    Scheduled Meds: . folic acid  1 mg Oral Daily  . gabapentin  100 mg Oral Daily   And  . gabapentin  400 mg Oral QHS  . multivitamin with minerals  1 tablet Oral Daily  . pantoprazole  40 mg Oral BID  . sodium chloride flush  10-40 mL Intracatheter Q12H  . thiamine  100 mg Oral  Daily  . vitamin B-12  500 mcg Oral Daily  . warfarin  5 mg Oral ONCE-1600  . Warfarin - Pharmacist Dosing Inpatient   Does not apply q1600   Continuous Infusions:    LOS: 7 days   Time spent: 30 -minute   Darliss Cheney,  MD [facility]  12/01/2020, 1:00 PM   To contact the attending provider between 7A-7P or the covering provider during after hours 7P-7A, please log into the web site [email or web address].

## 2020-12-01 NOTE — Plan of Care (Signed)
  Problem: Activity: °Goal: Risk for activity intolerance will decrease °Outcome: Progressing °  °Problem: Nutrition: °Goal: Adequate nutrition will be maintained °Outcome: Progressing °  °Problem: Elimination: °Goal: Will not experience complications related to bowel motility °Outcome: Progressing °  °Problem: Safety: °Goal: Ability to remain free from injury will improve °Outcome: Progressing °  °

## 2020-12-01 NOTE — Plan of Care (Signed)

## 2020-12-01 NOTE — TOC Progression Note (Signed)
Transition of Care [facility]) - Progression Note    Patient Details  Name: Mary [name] MRN: [id number] Date of Birth: 21-May-1961  Transition of Care [facility]) CM/SW Contact  Joanne Chars, LCSW Phone Number: 12/01/2020, 11:32 AM  Clinical Narrative:   Per Toniann Ket at Financial counseling, pt is not eligibel for medicaid.    Expected Discharge Plan: Skilled Nursing Facility Barriers to Discharge: SNF Pending bed offer,Inadequate or no insurance  Expected Discharge Plan and Services Expected Discharge Plan: [place] Choice: [place] arrangements for the past 2 months: Apartment                                       Social Determinants of Health (SDOH) Interventions    Readmission Risk Interventions No flowsheet data found.

## 2020-12-01 NOTE — Progress Notes (Signed)
Physical Therapy Treatment Patient Details Name: Mary [name] MRN: [id number] DOB: 1960/11/02 Today's Date: 12/01/2020    History of Present Illness Pt is a 60 y.o. female who presented 11/24/20 with repeated nausea vomiting and diarrhea and hematemesis. S/p EGD 11/27/20 which shows pyloric stenosis. This was dilated by GI. No other source of bleeding.Medical history significant of mechanical mitral and tricuspid valve replacement 2014 on Coumadin, history of alcohol abuse, chronic diastolic CHF, pyloric stenosis, marijuana use, and history of stroke.    PT Comments    Patient doing much better today. Continues to move slowly due to residual deficits from CVA and due to pain in rt groin and inner leg. She reports she does not want to go home until the source of her pain is identified. She walked 73 ft with supervision with RW and appears she would be able to slowly ascend the 2 flights of steps she has to go up to her apartment. She has no doubt that she could get up her steps. Discharge plan updated.     Follow Up Recommendations  Home health PT;Supervision - Intermittent     Equipment Recommendations  None recommended by PT    Recommendations for Other Services       Precautions / Restrictions Precautions Precautions: Fall    Mobility  Bed Mobility Overal bed mobility: Modified Independent             General bed mobility comments: incr time, no physical assist; she manually lifts RLE up onto bed    Transfers Overall transfer level: Needs assistance Equipment used: Rolling walker (2 wheeled);None Transfers: Sit to/from [employer] to Stand: Modified independent (Device/Increase time) Stand pivot transfers: Modified independent (Device/Increase time)       General transfer comment: on BSC on arrival (had gotten up by herself) pt pivoted back to bed without RW, then transfered to standing with RW and walked in room x 75  ft  Ambulation/Gait Ambulation/Gait assistance: Supervision Gait Distance (Feet): 75 Feet Assistive device: Rolling walker (2 wheeled) Gait Pattern/deviations: Step-through pattern;Decreased stride length;Shuffle;Trunk flexed;Step-to pattern Gait velocity: reduced   General Gait Details: ambulating with slight hip flexion bil (reports groin pain on rt-started before she came to the hospital); very limited foot clearance bil, but not dragging the floor   Stairs Stairs:  (discussed her technique with rail and cane and she just "takes (her) time"; feels she can manage steps with family standby assist)           Wheelchair Mobility    Modified Rankin (Stroke Patients Only)       Balance Overall balance assessment: Needs assistance Sitting-balance support: No upper extremity supported;Feet supported Sitting balance-Leahy Scale: Fair Sitting balance - Comments: Static sitting EOB with supervision.   Standing balance support: Bilateral upper extremity supported;During functional activity Standing balance-Leahy Scale: Poor Standing balance comment: reliant on UE support on RW and external assist for standing                            Cognition Arousal/Alertness: Awake/alert Behavior During Therapy: [facility] for tasks assessed/performed                                   General Comments: Not specifically tested. Home information she previously provided was consisent with today's report.      Exercises      General  Comments General comments (skin integrity, edema, etc.): Pt reports she wants to go home AND she does not want to leave until they have found the cause of her rt groin/leg pain      Pertinent Vitals/Pain Pain Assessment: Faces Faces Pain Scale: Hurts even more Pain Location: Rt groin and inner leg Pain Descriptors / Indicators: Discomfort Pain Intervention(s): Limited activity within patient's tolerance;Monitored during session     Home Living                      Prior Function            PT Goals (current goals can now be found in the care plan section) Acute Rehab PT Goals Patient Stated Goal: to improve Time For Goal Achievement: 12/13/20 Potential to Achieve Goals: Fair Progress towards PT goals: Progressing toward goals    Frequency    Min 3X/week      PT Plan Discharge plan needs to be updated;Frequency needs to be updated    Co-evaluation              AM-PAC PT "6 Clicks" Mobility   Outcome Measure  Help needed turning from your back to your side while in a flat bed without using bedrails?: None Help needed moving from lying on your back to sitting on the side of a flat bed without using bedrails?: None Help needed moving to and from a bed to a chair (including a wheelchair)?: None Help needed standing up from a chair using your arms (e.g., wheelchair or bedside chair)?: None Help needed to walk in hospital room?: A Little Help needed climbing 3-5 steps with a railing? : A Little 6 Click Score: 22    End of Session   Activity Tolerance: Patient tolerated treatment well Patient left: with call bell/phone within reach;in bed (bed alarm not set as pt getting up independently per nursing)   PT Visit Diagnosis: Unsteadiness on feet (R26.81);Other abnormalities of gait and mobility (R26.89);Muscle weakness (generalized) (M62.81);Difficulty in walking, not elsewhere classified (R26.2);Other symptoms and signs involving the nervous system (R29.898)     Time: 7741-2878 PT Time Calculation (min) (ACUTE ONLY): 15 min  Charges:  $Gait Training: 8-22 mins                      Arby Barrette, PT Pager [phone]    Rexanne Mano 12/01/2020, 4:02 PM

## 2020-12-01 NOTE — Progress Notes (Signed)
OT Cancellation Note  Patient Details Name: Mary [name] MRN: [id number] DOB: 1961/06/03   Cancelled Treatment:    Reason Eval/Treat Not Completed: Patient at procedure or test/ unavailable;Other (comment) pt working with PT, will check back as time allows for OT session  Harley Alto., COTA/L Acute Rehabilitation Services [phone]   Precious Haws 12/01/2020, 3:33 PM

## 2020-12-01 NOTE — Progress Notes (Signed)
ANTICOAGULATION CONSULT NOTE - Follow Up Consult  Pharmacy Consult for Warfarin Indication: mechanical valve   Patient Measurements: Height: 5\' 2"  (157.5 cm) Weight: 54.9 kg (121 lb) IBW/kg (Calculated) : 50.1  Vital Signs: Temp: 97.8 F (36.6 C) (03/08 0520) Temp Source: Oral (03/08 0520) BP: 133/96 (03/08 0520) Pulse Rate: 105 (03/08 0520)  Labs: Recent Labs    11/28/20 1313 11/28/20 1948 11/29/20 0358 11/29/20 0358 11/29/20 0359 11/30/20 0212 12/01/20 0107  HGB  --   --  7.9*   < >  --  8.7* 8.4*  HCT  --   --  23.1*  --   --  25.4* 23.8*  PLT  --   --  241  --   --  304 273  LABPROT  --   --  29.8*  --   --  34.6* 33.0*  INR  --   --  3.0*  --   --  3.6* 3.4*  HEPARINUNFRC 0.18* 0.29*  --   --  0.57  --   --   CREATININE  --   --  0.78  --   --  0.93  --    < > = values in this interval not displayed.    Estimated Creatinine Clearance: 52.2 mL/min (by C-G formula based on SCr of 0.93 mg/dL).  Assessment: 60 year old female who presented with hematemesis and supra-therapeutic INR after "loading" herself with warfarin.  Received Vitamin K 5 mg IV and 2.5 mg po 3/2 with resulting INR <2.  EGD 3/4 showed pyloric stenosis but no active bleeding.  Anticoagulation resumed and patient was bridged with heparin to warfarin.  Heparin stopped 3/6 with therapeutic INR.   INR therapeutic at 3.4  PTA Dose: 2.5 mg TThSSu and 5 mg AOD   Goal of Therapy:  Heparin level 0.3-0.5 units/ml Monitor platelets by anticoagulation protocol: Yes  INR goal 2.5 to 3.5   Plan:  Warfarin 5 mg x1 today  Monitor Hgb and s/sx of bleed with Hgb trending down  Daily INR and CBC  Leydy Worthey, Pharm.D., BCPS Clinical Pharmacist Clinical phone for 12/01/2020 from 8:30-4:00 is [phone].  **Pharmacist phone directory can be found on [email or web address] listed under [place].  12/01/2020 11:59 AM

## 2020-12-02 DIAGNOSIS — R7989 Other specified abnormal findings of blood chemistry: Secondary | ICD-10-CM

## 2020-12-02 LAB — CBC
HCT: 20.7 % — ABNORMAL LOW (ref 36.0–46.0)
Hemoglobin: 7.4 g/dL — ABNORMAL LOW (ref 12.0–15.0)
MCH: 38.5 pg — ABNORMAL HIGH (ref 26.0–34.0)
MCHC: 35.7 g/dL (ref 30.0–36.0)
MCV: 107.8 fL — ABNORMAL HIGH (ref 80.0–100.0)
Platelets: 212 10*3/uL (ref 150–400)
RBC: 1.92 MIL/uL — ABNORMAL LOW (ref 3.87–5.11)
RDW: 23.2 % — ABNORMAL HIGH (ref 11.5–15.5)
WBC: 5 10*3/uL (ref 4.0–10.5)
nRBC: 0 % (ref 0.0–0.2)

## 2020-12-02 LAB — PREPARE FRESH FROZEN PLASMA

## 2020-12-02 LAB — BPAM FFP
Blood Product Expiration Date: 202203092359
Blood Product Expiration Date: 202203092359
ISSUE DATE / TIME: 202203081155
ISSUE DATE / TIME: 202203081221
Unit Type and Rh: 7300
Unit Type and Rh: 7300

## 2020-12-02 LAB — PROTIME-INR
INR: 2 — ABNORMAL HIGH (ref 0.8–1.2)
Prothrombin Time: 21.9 seconds — ABNORMAL HIGH (ref 11.4–15.2)

## 2020-12-02 MED ORDER — WARFARIN SODIUM 5 MG PO TABS
5.0000 mg | ORAL_TABLET | Freq: Once | ORAL | Status: AC
  Administered 2020-12-02: 5 mg via ORAL
  Filled 2020-12-02: qty 1

## 2020-12-02 NOTE — Progress Notes (Signed)
Occupational Therapy Treatment/Discharge Patient Details Name: Mary [name] MRN: [id number] DOB: Dec 31, 1960 Today's Date: 12/02/2020    History of present illness Pt is a 60 y.o. female who presented 11/24/20 with repeated nausea vomiting and diarrhea and hematemesis. S/p EGD 11/27/20 which shows pyloric stenosis. This was dilated by GI. No other source of bleeding.Medical history significant of mechanical mitral and tricuspid valve replacement 2014 on Coumadin, history of alcohol abuse, chronic diastolic CHF, pyloric stenosis, marijuana use, and history of stroke.   OT comments  Pt has met OT goals, requiring no more than Setup assist for ADLs/mobility in room using RW in this setting. Pt completed all bathing tasks seated at sink without physical assist. Pt assisted in making bed at end of session, no LOB or safety concerns noted. Pt with continued R LE pain but much improved in functional abilities compared to OT eval. Pt with no DME needs and appears able to manage basic ADLs/IADLs in the home with decreased fall risk. No OT follow-up needed at DC. OT to sign off at acute level.    Follow Up Recommendations  No OT follow up    Equipment Recommendations  None recommended by OT    Recommendations for Other Services      Precautions / Restrictions Precautions Precautions: Fall Restrictions Weight Bearing Restrictions: No       Mobility Bed Mobility                    Transfers Overall transfer level: Modified independent Equipment used: Rolling walker (2 wheeled) Transfers: Sit to/from Stand Sit to Stand: Modified independent (Device/Increase time)         General transfer comment: Able to use RW, no physical assist needed or safety concerns    Balance Overall balance assessment: Needs assistance Sitting-balance support: No upper extremity supported;Feet supported Sitting balance-Leahy Scale: Good     Standing balance support: Bilateral upper extremity  supported;During functional activity Standing balance-Leahy Scale: Fair                             ADL either performed or assessed with clinical judgement   ADL Overall ADL's : Needs assistance/impaired     Grooming: Independent;Sitting Grooming Details (indicate cue type and reason): Independent for oral care, grooming tasks seated at sink Upper Body Bathing: Independent;Sitting   Lower Body Bathing: Set up;Sit to/from stand;Sitting/lateral leans Lower Body Bathing Details (indicate cue type and reason): Setup sitting at sink, finishing up task on entry. Used pillow case to loop and lift R LE out of water basin and dry Upper Body Dressing : Independent;Sitting   Lower Body Dressing: Set up;Sit to/from stand Lower Body Dressing Details (indicate cue type and reason): Setup to don shoes             Functional mobility during ADLs: Supervision/safety;Rolling walker General ADL Comments: Pt still with R LE pain though much improved from eval. Setup at most for bathing tasks seated at sink. Able to assist in making bed without LOB     Vision   Vision Assessment?: No apparent visual deficits   Perception     Praxis      Cognition Arousal/Alertness: Awake/alert Behavior During Therapy: WFL for tasks assessed/performed Overall Cognitive Status: Within Functional Limits for tasks assessed  General Comments: WFL though not formally tested        Exercises     Shoulder Instructions       General Comments Discussed home setup, routine and any DME needs. Pt reports using a stool in kitchen but it is wobbly, plans to get son to take her to [employer] to get a cushion she could place over [facility] to use as chair in kitchen for now.Has shower chair and walk in and tub/shower combo.    Pertinent Vitals/ Pain       Pain Assessment: Faces Faces Pain Scale: Hurts a little bit Pain Location: Rt LE Pain Descriptors /  Indicators: Discomfort Pain Intervention(s): Monitored during session;Limited activity within patient's tolerance  Home Living                                          Prior Functioning/Environment              Frequency  Min 2X/week        Progress Toward Goals  OT Goals(current goals can now be found in the care plan section)  Progress towards OT goals: Progressing toward goals  Acute Rehab OT Goals Patient Stated Goal: decrease pain, go home OT Goal Formulation: With patient Time For Goal Achievement: 12/12/20 Potential to Achieve Goals: Good ADL Goals Pt Will Perform Grooming: with supervision;standing Pt Will Perform Lower Body Dressing: with supervision;sit to/from stand Pt Will Transfer to Toilet: with supervision;ambulating Pt Will Perform Toileting - Clothing Manipulation and hygiene: with supervision;sit to/from stand;sitting/lateral leans  Plan Discharge plan needs to be updated    Co-evaluation                 AM-PAC OT "6 Clicks" Daily Activity     Outcome Measure   Help from another person eating meals?: None Help from another person taking care of personal grooming?: None Help from another person toileting, which includes using toliet, bedpan, or urinal?: A Little Help from another person bathing (including washing, rinsing, drying)?: A Little Help from another person to put on and taking off regular upper body clothing?: None Help from another person to put on and taking off regular lower body clothing?: A Little 6 Click Score: 21    End of Session Equipment Utilized During Treatment: Rolling walker  OT Visit Diagnosis: Other abnormalities of gait and mobility (R26.89);Unsteadiness on feet (R26.81);Muscle weakness (generalized) (M62.81);Pain Pain - Right/Left: Right Pain - part of body: Leg   Activity Tolerance Patient tolerated treatment well   Patient Left in bed;Other (comment) (with PT sitting bedside)   Nurse  Communication Mobility status        Time: 8309-4076 OT Time Calculation (min): 31 min  Charges: OT General Charges $OT Visit: 1 Visit OT Treatments $Self Care/Home Management : 23-37 mins  Malachy Chamber, OTR/L Acute Rehab Services Office: [phone]   Layla Maw 12/02/2020, 9:29 AM

## 2020-12-02 NOTE — Progress Notes (Signed)
ANTICOAGULATION CONSULT NOTE - Follow Up Consult  Pharmacy Consult for Warfarin Indication: mechanical valve   Patient Measurements: Height: 5\' 2"  (157.5 cm) Weight: 54.9 kg (121 lb) IBW/kg (Calculated) : 50.1  Vital Signs: Temp: 98.5 F (36.9 C) (03/09 0733) Temp Source: Oral (03/09 0733) BP: 119/70 (03/09 0733) Pulse Rate: 84 (03/09 0502)  Labs: Recent Labs    11/30/20 0212 12/01/20 0107 12/01/20 1345 12/02/20 0500  HGB 8.7* 8.4*  --  7.4*  HCT 25.4* 23.8*  --  20.7*  PLT 304 273  --  212  LABPROT 34.6* 33.0* 20.1* 21.9*  INR 3.6* 3.4* 1.8* 2.0*  CREATININE 0.93  --   --   --     Estimated Creatinine Clearance: 52.2 mL/min (by C-G formula based on SCr of 0.93 mg/dL).  Assessment: 60 year old female who presented with hematemesis and supra-therapeutic INR after "loading" herself with warfarin.  Received Vitamin K 5 mg IV and 2.5 mg po 3/2 with resulting INR <2.  EGD 3/4 showed pyloric stenosis but no active bleeding.  Anticoagulation resumed and patient was bridged with heparin to warfarin.  Heparin stopped 3/6 with therapeutic INR.   INR therapeutic at 2  PTA Dose: 2.5 mg TThSSu and 5 mg AOD   Goal of Therapy:  Heparin level 0.3-0.5 units/ml Monitor platelets by anticoagulation protocol: Yes  INR goal 2.5 to 3.5   Plan:  Warfarin 5 mg x1 today  Monitor Hgb and s/sx of bleed with Hgb trending down  Daily INR and CBC  Alanda Slim, PharmD, [facility] Clinical Pharmacist Please see AMION for all Pharmacists' Contact Phone Numbers 12/02/2020, 8:40 AM

## 2020-12-02 NOTE — Progress Notes (Signed)
Physical Therapy Treatment Patient Details Name: Mary [name] MRN: [id number] DOB: 14-Mar-1961 Today's Date: 12/02/2020    History of Present Illness Pt is a 60 y.o. female who presented 11/24/20 with repeated nausea vomiting and diarrhea and hematemesis. S/p EGD 11/27/20 which shows pyloric stenosis. This was dilated by GI. No other source of bleeding.Medical history significant of mechanical mitral and tricuspid valve replacement 2014 on Coumadin, history of alcohol abuse, chronic diastolic CHF, pyloric stenosis, marijuana use, and history of stroke.    PT Comments    Patient with brighter affect and tolerating increased activity (including stair training).    Follow Up Recommendations  Home health PT;Supervision - Intermittent     Equipment Recommendations  None recommended by PT    Recommendations for Other Services       Precautions / Restrictions Precautions Precautions: Fall Restrictions Weight Bearing Restrictions: No    Mobility  Bed Mobility               General bed mobility comments: EOB on arrival    Transfers Overall transfer level: Modified independent Equipment used: Rolling walker (2 wheeled) Transfers: Sit to/from Stand Sit to Stand: Modified independent (Device/Increase time)         General transfer comment: Able to use RW, no physical assist needed or safety concerns  Ambulation/Gait Ambulation/Gait assistance: Supervision Gait Distance (Feet): 30 Feet Assistive device: Rolling walker (2 wheeled) Gait Pattern/deviations: Step-through pattern;Decreased stride length;Shuffle;Trunk flexed;Step-to pattern     General Gait Details: encouragement to walk in room after working with OT and doing steps   Stairs Stairs: Yes Stairs assistance: Min assist Stair Management: One rail Left;One rail Right;Step to pattern;Forwards Number of Stairs: 1 (x 5 reps) General stair comments: used left rail x 2; right rail x 3; educated on leading with  stronger leg and descending with weaker leg (strong leg lowering her)   Wheelchair Mobility    Modified Rankin (Stroke Patients Only)       Balance Overall balance assessment: Needs assistance Sitting-balance support: No upper extremity supported;Feet supported Sitting balance-Leahy Scale: Good     Standing balance support: During functional activity;Single extremity supported Standing balance-Leahy Scale: Fair                              Cognition Arousal/Alertness: Awake/alert Behavior During Therapy: WFL for tasks assessed/performed Overall Cognitive Status: Within Functional Limits for tasks assessed                                 General Comments: WFL though not formally tested      Exercises      General Comments General comments (skin integrity, edema, etc.): Discussed home setup, routine and any DME needs. Pt reports using a stool in kitchen but it is wobbly, plans to get son to take her to [employer] to get a cushion she could place over [facility] to use as chair in kitchen for now.Has shower chair and walk in and tub/shower combo.      Pertinent Vitals/Pain Pain Assessment: Faces Faces Pain Scale: Hurts a little bit Pain Location: Rt LE Pain Descriptors / Indicators: Discomfort Pain Intervention(s): Limited activity within patient's tolerance;Monitored during session    Home Living                      Prior Function  PT Goals (current goals can now be found in the care plan section) Acute Rehab PT Goals Patient Stated Goal: decrease pain, go home Time For Goal Achievement: 12/13/20 Potential to Achieve Goals: Fair Progress towards PT goals: Progressing toward goals    Frequency    Min 3X/week      PT Plan Current plan remains appropriate    Co-evaluation              AM-PAC PT "6 Clicks" Mobility   Outcome Measure  Help needed turning from your back to your side while in a flat bed without  using bedrails?: None Help needed moving from lying on your back to sitting on the side of a flat bed without using bedrails?: None Help needed moving to and from a bed to a chair (including a wheelchair)?: None Help needed standing up from a chair using your arms (e.g., wheelchair or bedside chair)?: None Help needed to walk in hospital room?: A Little Help needed climbing 3-5 steps with a railing? : A Little 6 Click Score: 22    End of Session   Activity Tolerance: Patient limited by fatigue Patient left: in bed;with call bell/phone within reach   PT Visit Diagnosis: Unsteadiness on feet (R26.81);Other abnormalities of gait and mobility (R26.89);Muscle weakness (generalized) (M62.81);Difficulty in walking, not elsewhere classified (R26.2);Other symptoms and signs involving the nervous system (R94.585)     Time: 9292-4462 PT Time Calculation (min) (ACUTE ONLY): 28 min  Charges:  $Gait Training: 23-37 mins                      Arby Barrette, PT Pager [phone]    Rexanne Mano 12/02/2020, 11:44 AM

## 2020-12-02 NOTE — TOC Progression Note (Addendum)
Transition of Care [facility]) - Progression Note    Patient Details  Name: Mary [name] MRN: [id number] Date of Birth: December 26, 1960  [facility] CM/SW Contact  Joanne Chars, LCSW Phone Number: 12/02/2020, 10:36 AM  Clinical Narrative:     CSW made referral to [facility] services.  Waiting on response. CSW met with pt regarding [place] referral and readmission risk items. Pt in agreement with this plan.  Discussed her equipment needs.  Pt currently has walker, cane, and bedside commode in her apartment, no other recommendations from PT/OT and pt does not report she needs anything else.   Pt is current at [facility] and reports they provide transportation to the appts.   CSW discussed housing resources with pt.  Pt reports that she is working with [employer] on her housing situation.  (Please see 3/7 note from [place])  Discussed [employer] and pt said she and her case manager at [facility] have been in touch with them already.  CSW advised that she continue to work with these people on her housing situation and pt said she plans on this.  Pt continues to report that her two sons are still available to assist at discharge.  [facility] has pt scheduled for 4/7 hospital discharge appt already.    1445: [facility] accepts for charity HH, PT only.    Expected Discharge Plan: Skilled Nursing Facility Barriers to Discharge: SNF Pending bed offer,Inadequate or no insurance  Expected Discharge Plan and Services Expected Discharge Plan: [place] Choice: [place] Living arrangements for the past 2 months: Apartment                                       Social Determinants of Health (SDOH) Interventions    Readmission Risk Interventions Readmission Risk Prevention Plan 12/02/2020  Transportation Screening Complete  PCP or Specialist  appointment within 3-5 days of discharge Not Complete  PCP/Specialist Appt Not Complete comments First available appt was already scheduled on 4/7.  [place] or Home Care Consult Complete  SW Recovery Care/Counseling Consult Complete  Palliative Care Screening Not Applicable  Skilled Nursing Facility Not Applicable  Some recent data might be hidden

## 2020-12-02 NOTE — Progress Notes (Signed)
Patient ID: Mary [name], female   DOB: 04-20-1961, 60 y.o.   MRN: [id number]  PROGRESS NOTE    SANAIA JASSO  HAL:[id number] DOB: 04/05/1961 DOA: 11/24/2020 PCP: [name], MD   Brief Narrative:  60 y.o.femalewith medical history significant ofmechanical mitral and tricuspid valve replacement in 2014 currently on Coumadin, history of alcohol abuse, chronic diastolic CHF, pyloric stenosis, marijuana use, history of stroke presented with repeated nausea vomiting and diarrhea and hematemesis. Upon arrival to ED, she was hemodynamically stable.  CT head was unremarkable.She had elevated LFTs for which ultrasound abdomen was done which showed fatty liver similar to last year.  She had mild AKI as well as supratherapeutic INR of 8.2.  She received 2.5 mg of oral vitamin K.  GI was consulted.  INR was still elevated over 7 the next day so she received another dose of vitamin K 5 mg IV.  INR dropped to 2.1.  Hemoglobin also dropped from 9.9 at the time of admission to 6.4 on 11/26/2020 for which patient received 1 unit of PRBC transfusion.  Due to complicated cardiac history, cardiology was consulted who recommended that patient should be on anticoagulation at all times unless there is a life-threatening emergency.  Due to subtherapeutic INR  (goal INR 2.5-3.5 ) patient was bridged with IV heparin.  She has not had any further hematemesis.  Status post EGD 11/27/2020 which shows pyloric stenosis.  This was dilated by GI up to 10 mm.  No other source of bleeding.  She was started back on Coumadin with heparin drip as bridge.  INR therapeutic, heparin discontinued.  PT/OT initially recommended SNF.  She complained of persistent right hip pain for which right hip CT showed joint effusion.  Prior hospitalist discussed with Dr. Jacques Earthly who recommended IR consult for arthrocentesis.  IR attempted arthrocentesis on 12/01/2020 but no fluid could be removed.  Assessment & Plan:   Nausea and  vomiting/upper GI bleed/acute blood loss anemia/pyloric stenosis -Hemoglobin dropped from 9.9 at the time of admission to 6.4 on 11/26/2020 for which patient received 1 unit of PRBC transfusion.   -She has not had any further hematemesis.   -Status post EGD on 11/27/2020 which showed pyloric stenosis.  This was dilated to 10 mm.  No other source of bleeding.   -Hemoglobin is currently stable.   -Continue twice daily Protonix and monitor CBC daily  -Reglan discontinued by GI.  Tolerating soft diet now.  GI has signed off  History of mechanical mitral and tricuspid valve Supratherapeutic INR: Resolved -Cardiology recommended that patient must be anticoagulated unless she has life-threatening hemorrhage and recommended to start her on heparin if her INR falls less than 2.5.  -Patient was given FFP on 12/01/2020 for possible arthrocentesis -INR is 2 today.  Continue Coumadin.  Pharmacy following.  Mild AKI -Resolved.  Continue to hold diuretics   Elevated LFTs: -Elevated AST/ALT ratio to implying active drinking.  However patient denied. -RUQ ultrasound showed fatty liver similar as last year.  LFTs improving.  No abdominal pain or tenderness.   History of alcohol abuse -No symptoms signs of acute withdrawal; CIWA discontinued.  Chronic diastolic CHF  -Looks euvolemic.  Hold diuretic for now.  Reassess tomorrow.  History of unspecified stroke: Patient tells me that she had a stroke during Thanksgiving 2021 and since then she has been having slurred speech.  Outpatient follow-up with PCP/neurology  Marijuana abuse: Counseled by prior hospitalist.  Right lower extremity weakness - Patient complained of right  lower extremity weakness since about 1 to 2 weeks.  She also complains of low back pain which also started about 2 weeks ago.  She tells me that she had lumbar puncture done during Thanksgiving when she had stroke.  Her history is very vague.   - CT right hip was obtained which  showed right hip effusion. Orthopedic/Dr. Alvan Dame recommended IR for arthrocentesis.    IR attempted arthrocentesis on 12/01/2020 but was unsuccessful.  Awaiting formal recommendations from Dr. Jacques Earthly; I have communicated with him regarding the same today.   DVT prophylaxis: Coumadin Code Status: Full Family Communication: None at bedside Disposition Plan: Status is: Inpatient  Remains inpatient appropriate because:Inpatient level of care appropriate due to severity of illness   Dispo: The patient is from: Home              Anticipated d/c is to: Home in 1 to 2 days once clinically improved              Patient currently is not medically stable to d/c.   Difficult to place patient Yes  Consultants: GI/cardiology.  Orthopedics evaluation is pending  Procedures: EGD on 11/27/2020  Antimicrobials: None   Subjective: Patient seen and examined at bedside.  Poor historian.  No overnight fever, vomiting, worsening shortness of breath reported.  Objective: Vitals:   12/01/20 1245 12/01/20 1926 12/02/20 0502 12/02/20 0733  BP: 125/77 (!) 152/80 126/75 119/70  Pulse: 92  84   Resp: 19 20 18 15   Temp: 98.3 F (36.8 C) 99.5 F (37.5 C) 98.9 F (37.2 C) 98.5 F (36.9 C)  TempSrc: Oral Oral Oral Oral  SpO2:  97% 98% 100%  Weight:      Height:       No intake or output data in the 24 hours ending 12/02/20 1402 Filed Weights   11/24/20 1144  Weight: 54.9 kg    Examination:  General exam: Appears calm and comfortable.  Looks chronically ill and older than stated age.  On room air currently Respiratory system: Bilateral decreased breath sounds at bases Cardiovascular system: S1 & S2 heard, Rate controlled Gastrointestinal system: Abdomen is nondistended, soft and nontender. Normal bowel sounds heard. Extremities: No cyanosis, clubbing; trace lower extremity edema Central nervous system: Alert and oriented. No focal neurological deficits. Moving extremities Skin: No rashes,  lesions or ulcers Psychiatry: Flat affect    Data Reviewed: I have personally reviewed following labs and imaging studies  CBC: Recent Labs  Lab 11/27/20 1529 11/28/20 0110 11/29/20 0358 11/30/20 0212 12/01/20 0107 12/02/20 0500  WBC 6.4 7.8 5.6 5.8 6.5 5.0  NEUTROABS 4.4  --   --   --   --   --   HGB 9.2* 9.0* 7.9* 8.7* 8.4* 7.4*  HCT 26.3* 24.9* 23.1* 25.4* 23.8* 20.7*  MCV 108.7* 107.3* 109.0* 108.5* 108.2* 107.8*  PLT 241 227 241 304 273 188   Basic Metabolic Panel: Recent Labs  Lab 11/25/20 2347 11/27/20 0325 11/28/20 0110 11/29/20 0358 11/30/20 0212  NA 140 135 137 139 139  K 2.6* 5.1 5.4* 4.6 4.3  CL 117* 111 114* 113* 111  CO2 16* 18* 17* 18* 19*  GLUCOSE 70 97 73 75 84  BUN 5* 6 6 5* <5*  CREATININE 0.75 1.13* 0.88 0.78 0.93  CALCIUM 6.9* 8.5* 8.5* 8.2* 8.4*  MG 1.1* 1.9 1.7  --   --   PHOS  --   --  3.2  --   --    GFR:  Estimated Creatinine Clearance: 52.2 mL/min (by C-G formula based on SCr of 0.93 mg/dL). Liver Function Tests: Recent Labs  Lab 11/25/20 2347 11/27/20 0325 11/28/20 0110  AST 38 35 32  ALT 45* 48* 38  ALKPHOS 97 149* 129*  BILITOT 0.9 1.2 0.9  PROT 3.7* 5.1* 5.0*  ALBUMIN 1.6* 2.3* 2.2*   No results for input(s): LIPASE, AMYLASE in the last 168 hours. No results for input(s): AMMONIA in the last 168 hours. Coagulation Profile: Recent Labs  Lab 11/29/20 0358 11/30/20 0212 12/01/20 0107 12/01/20 1345 12/02/20 0500  INR 3.0* 3.6* 3.4* 1.8* 2.0*   Cardiac Enzymes: No results for input(s): CKTOTAL, CKMB, CKMBINDEX, TROPONINI in the last 168 hours. BNP (last 3 results) No results for input(s): PROBNP in the last 8760 hours. HbA1C: No results for input(s): HGBA1C in the last 72 hours. CBG: No results for input(s): GLUCAP in the last 168 hours. Lipid Profile: No results for input(s): CHOL, HDL, LDLCALC, TRIG, CHOLHDL, LDLDIRECT in the last 72 hours. Thyroid Function Tests: No results for input(s): TSH, T4TOTAL, FREET4,  T3FREE, THYROIDAB in the last 72 hours. Anemia Panel: No results for input(s): VITAMINB12, FOLATE, FERRITIN, TIBC, IRON, RETICCTPCT in the last 72 hours. Sepsis Labs: No results for input(s): PROCALCITON, LATICACIDVEN in the last 168 hours.  Recent Results (from the past 240 hour(s))  SARS CORONAVIRUS 2 (TAT 6-24 HRS) Nasopharyngeal Nasopharyngeal Swab     Status: None   Collection Time: 11/24/20 10:18 PM   Specimen: Nasopharyngeal Swab  Result Value Ref Range Status   SARS Coronavirus 2 NEGATIVE NEGATIVE Final    Comment: (NOTE) SARS-CoV-2 target nucleic acids are NOT DETECTED.  The SARS-CoV-2 RNA is generally detectable in upper and lower respiratory specimens during the acute phase of infection. Negative results do not preclude SARS-CoV-2 infection, do not rule out co-infections with other pathogens, and should not be used as the sole basis for treatment or other patient management decisions. Negative results must be combined with clinical observations, patient history, and epidemiological information. The expected result is Negative.  Fact Sheet for Patients: SugarRoll.be  Fact Sheet for Healthcare Providers: https://www.woods-mathews.com/  This test is not yet approved or cleared by the [place] FDA and  has been authorized for detection and/or diagnosis of SARS-CoV-2 by FDA under an Emergency Use Authorization (EUA). This EUA will remain  in effect (meaning this test can be used) for the duration of the COVID-19 declaration under Se ction 564(b)(1) of the Act, 21 U.S.C. section 360bbb-3(b)(1), unless the authorization is terminated or revoked sooner.  Performed at [facility], [place] [address] [place], [place] [address]          Radiology Studies: CT HIP RIGHT WO CONTRAST  Result Date: 12/01/2020 CLINICAL DATA:  Right hip pain.  Question infection. EXAM: CT OF THE RIGHT HIP WITHOUT CONTRAST TECHNIQUE:  Multidetector CT imaging of the right hip was performed according to the standard protocol. Multiplanar CT image reconstructions were also generated. COMPARISON:  CT abdomen and pelvis 11/26/2020. FINDINGS: Bones/Joint/Cartilage There is no acute bony abnormality. No bony destructive change or periosteal reaction. Hip joint space is preserved with minimal subchondral cyst formation in the periphery of the acetabulum consistent with mild degenerative change noted. The patient has a moderate right hip joint effusion which was present on the prior exam. Ligaments Suboptimally assessed by CT. Muscles and Tendons No intramuscular fluid collection or mass. No evidence of tear strain. Soft tissues There is edema in subcutaneous fatty tissues about the  hip. Imaged intrapelvic contents are unremarkable. IMPRESSION: Moderate right hip effusion could be septic or aseptic. As there is clinical concern for infection, right hip joint aspiration is recommended. No bony findings to suggest osteomyelitis. Electronically Signed   By: Inge Rise M.D.   On: 12/01/2020 08:34   DG FLUORO GUIDED NEEDLE PLC ASPIRATION/INJECTION LOC  Result Date: 12/01/2020 CLINICAL DATA:  Right hip effusion on CT. EXAM: RIGHT HIP ASPIRATION UNDER FLUOROSCOPY COMPARISON:  None. TECHNIQUE: An appropriate skin entrance site was determined. The site was marked, prepped with Betadine, draped in the usual sterile fashion, and infiltrated locally with buffered Lidocaine. 22 gauge spinal needle was advanced to the superolateral junction of the femoral head and neck. FLUOROSCOPY TIME:  Fluoroscopy Time:  1 minutes and 12 seconds. Radiation Exposure Index (if provided by the fluoroscopic device): 2.7 mGy PROCEDURE: See below. FINDINGS: Risks benefits and alternatives to the procedure were discussed with the patient. Emphasized risks included bleeding, infection, and inability to obtain joint fluid. Bleeding risk was further emphasized due to the patient's  elevated INR. Patient voiced understanding and wished to proceed. Written informed consent was obtained. Prior to beginning the procedure, a time-out was performed to confirm patient identity and procedure site. Using fluoroscopic, appropriate skin site over the right hip was identified. The region was prepped and draped in the usual sterile fashion. Anesthesia of the skin and deeper soft tissues was obtained using 1% lidocaine. 22 gauge spinal needle was advanced to the anterior cortex of the lateral femoral head at the junction with the femoral neck without the ability to aspirate joint fluid. 2 additional passes were made with the 22 gauge spinal needle to the lateral junction of the femoral head/neck region. Neither allowed acquisition of joint fluid. I tried to opacify the joint space with a small amount of iodinated contrast material, but was unable. At this point, the patient stated that she could not tolerate the procedure any longer and requested that we stop. IMPRESSION: Unsuccessful attempt at right hip aspiration. Needle placement appeared appropriate based on fluoroscopic confirmation, but no joint fluid could be aspirated. The patient asked that the procedure be terminated before fluid could be obtained. No evidence for immediate complicating features. Electronically Signed   By: Misty Stanley M.D.   On: 12/01/2020 15:51        Scheduled Meds: . folic acid  1 mg Oral Daily  . gabapentin  100 mg Oral Daily   And  . gabapentin  400 mg Oral QHS  . multivitamin with minerals  1 tablet Oral Daily  . pantoprazole  40 mg Oral BID  . sodium chloride flush  10-40 mL Intracatheter Q12H  . thiamine  100 mg Oral Daily  . vitamin B-12  500 mcg Oral Daily  . warfarin  5 mg Oral ONCE-1600  . Warfarin - Pharmacist Dosing Inpatient   Does not apply q1600   Continuous Infusions:        Aline August, MD [facility] 12/02/2020, 2:02 PM

## 2020-12-03 ENCOUNTER — Other Ambulatory Visit (HOSPITAL_COMMUNITY): Payer: Self-pay | Admitting: Internal Medicine

## 2020-12-03 ENCOUNTER — Ambulatory Visit: Payer: Self-pay | Admitting: *Deleted

## 2020-12-03 LAB — CBC WITH DIFFERENTIAL/PLATELET
Abs Immature Granulocytes: 0.02 10*3/uL (ref 0.00–0.07)
Basophils Absolute: 0 10*3/uL (ref 0.0–0.1)
Basophils Relative: 1 %
Eosinophils Absolute: 0.2 10*3/uL (ref 0.0–0.5)
Eosinophils Relative: 3 %
HCT: 22.7 % — ABNORMAL LOW (ref 36.0–46.0)
Hemoglobin: 7.8 g/dL — ABNORMAL LOW (ref 12.0–15.0)
Immature Granulocytes: 0 %
Lymphocytes Relative: 24 %
Lymphs Abs: 1.2 10*3/uL (ref 0.7–4.0)
MCH: 38 pg — ABNORMAL HIGH (ref 26.0–34.0)
MCHC: 34.4 g/dL (ref 30.0–36.0)
MCV: 110.7 fL — ABNORMAL HIGH (ref 80.0–100.0)
Monocytes Absolute: 0.7 10*3/uL (ref 0.1–1.0)
Monocytes Relative: 13 %
Neutro Abs: 2.9 10*3/uL (ref 1.7–7.7)
Neutrophils Relative %: 59 %
Platelets: 223 10*3/uL (ref 150–400)
RBC: 2.05 MIL/uL — ABNORMAL LOW (ref 3.87–5.11)
RDW: 22.2 % — ABNORMAL HIGH (ref 11.5–15.5)
WBC: 5 10*3/uL (ref 4.0–10.5)
nRBC: 0 % (ref 0.0–0.2)

## 2020-12-03 LAB — BASIC METABOLIC PANEL
Anion gap: 5 (ref 5–15)
BUN: 5 mg/dL — ABNORMAL LOW (ref 6–20)
CO2: 27 mmol/L (ref 22–32)
Calcium: 8.6 mg/dL — ABNORMAL LOW (ref 8.9–10.3)
Chloride: 106 mmol/L (ref 98–111)
Creatinine, Ser: 0.83 mg/dL (ref 0.44–1.00)
GFR, Estimated: >60 mL/min (ref >60)
Glucose, Bld: 89 mg/dL (ref 70–99)
Potassium: 4 mmol/L (ref 3.5–5.1)
Sodium: 138 mmol/L (ref 135–145)

## 2020-12-03 LAB — PROTIME-INR
INR: 2.1 — ABNORMAL HIGH (ref 0.8–1.2)
Prothrombin Time: 23 seconds — ABNORMAL HIGH (ref 11.4–15.2)

## 2020-12-03 LAB — MAGNESIUM: Magnesium: 1.4 mg/dL — ABNORMAL LOW (ref 1.7–2.4)

## 2020-12-03 MED ORDER — ADULT MULTIVITAMIN W/MINERALS CH: 1.0000 | ORAL_TABLET | Freq: Every day | ORAL | Status: DC

## 2020-12-03 MED ORDER — OXYCODONE HCL 5 MG PO TABS: 5.0000 mg | ORAL_TABLET | Freq: Four times a day (QID) | ORAL | 0 refills | Status: DC | PRN

## 2020-12-03 MED ORDER — WARFARIN SODIUM 2.5 MG PO TABS: 2.5000 mg | ORAL_TABLET | Freq: Once | ORAL | Status: DC

## 2020-12-03 MED ORDER — PANTOPRAZOLE SODIUM 40 MG PO TBEC
40.0000 mg | DELAYED_RELEASE_TABLET | Freq: Two times a day (BID) | ORAL | 0 refills | Status: DC
Start: 2020-12-03 — End: 2020-12-24

## 2020-12-03 MED ORDER — ENOXAPARIN SODIUM 60 MG/0.6ML ~~LOC~~ SOLN
1.0000 mg/kg | Freq: Two times a day (BID) | SUBCUTANEOUS | Status: DC
  Administered 2020-12-03: 55 mg via SUBCUTANEOUS
  Filled 2020-12-03: qty 0.6

## 2020-12-03 MED ORDER — MAGNESIUM SULFATE 2 GM/50ML IV SOLN
2.0000 g | Freq: Once | INTRAVENOUS | Status: AC
  Administered 2020-12-03: 2 g via INTRAVENOUS
  Filled 2020-12-03: qty 50

## 2020-12-03 MED ORDER — THIAMINE HCL 100 MG PO TABS: 100.0000 mg | ORAL_TABLET | Freq: Every day | ORAL | 0 refills | Status: DC

## 2020-12-03 MED ORDER — ENOXAPARIN SODIUM 60 MG/0.6ML ~~LOC~~ SOLN: 1.0000 mg/kg | Freq: Two times a day (BID) | SUBCUTANEOUS | 0 refills | Status: DC

## 2020-12-03 MED FILL — VITAMIN B-1 100 MG TABS: 100 | 30 days supply | Qty: 30 | Fill #0

## 2020-12-03 MED FILL — oxyCODONE HCL 5 MG TABS: 5 | 3 days supply | Qty: 14 | Fill #0

## 2020-12-03 MED FILL — PANTOPRAZOLE SOD DR 40 MG T: 40 | 30 days supply | Qty: 60 | Fill #0

## 2020-12-03 MED FILL — ENOXAPARIN SODIUM 60 MG/0.6: 60 | 5 days supply | Qty: 6 | Fill #0

## 2020-12-03 NOTE — Progress Notes (Signed)
AVS paperwork provided to pt. All questions answered.

## 2020-12-03 NOTE — Progress Notes (Signed)
ANTICOAGULATION CONSULT NOTE - Follow Up Consult  Pharmacy Consult for Warfarin Indication: mechanical valve   Patient Measurements: Height: 5\' 2"  (157.5 cm) Weight: 55.7 kg (122 lb 12.7 oz) IBW/kg (Calculated) : 50.1  Vital Signs: Temp: 98.9 F (37.2 C) (03/10 0517) Temp Source: Oral (03/10 0517) BP: 140/69 (03/10 0517) Pulse Rate: 76 (03/10 0517)  Labs: Recent Labs    12/01/20 0107 12/01/20 1345 12/02/20 0500 12/03/20 0620  HGB 8.4*  --  7.4* 7.8*  HCT 23.8*  --  20.7* 22.7*  PLT 273  --  212 223  LABPROT 33.0* 20.1* 21.9* 23.0*  INR 3.4* 1.8* 2.0* 2.1*  CREATININE  --   --   --  0.83    Estimated Creatinine Clearance: 58.4 mL/min (by C-G formula based on SCr of 0.83 mg/dL).  Assessment: 60 year old female who presented with hematemesis and supra-therapeutic INR after "loading" herself with warfarin.  Received Vitamin K 5 mg IV and 2.5 mg po 3/2 with resulting INR <2.  EGD 3/4 showed pyloric stenosis but no active bleeding.  Anticoagulation resumed and patient was bridged with heparin to warfarin.  Heparin stopped 3/6 with therapeutic INR. Cardiology recommended that patient must be anticoagulated unless she has life-threatening hemorrhage and recommended to start her on heparin if her INR falls less than 2.5. Patient was given FFP on 12/01/2020 for possible arthrocentesis and INR dropped significantly from 3.4 to 1.8. Patient INR continues to be subtherapeutic today, will start bridge with Lovenox. Plan is to discharge today.  INR SUB-therapeutic at 2.1  PTA Dose was supposed to be: 5 mg on Fridays and 2.5 mg AOD.per 2/21 anticoag note.  Goal of Therapy:  Anti-Xa level 0.6-1 units/ml 4hrs after LMWH dose given Monitor platelets by anticoagulation protocol: Yes  INR goal 2.5 to 3.5   Plan:  Start Lovenox 1 mg/kg subQ every 12 hours Warfarin 2.5 mg x 1 today  Monitor daily INR, CBC, clinical course, s/sx of bleed, PO intake/diet, Drug-Drug Interactions   Thank you  for allowing [place] to participate in this patients care. Jens Som, PharmD 12/03/2020 9:02 AM  Please check [email or web address] for unit-specific pharmacy phone numbers.

## 2020-12-03 NOTE — Discharge Summary (Signed)
Physician Discharge Summary  Mary [name] YCX:[id number] DOB: 12/16/1960 DOA: 11/24/2020  PCP: [name], MD  Admit date: 11/24/2020 Discharge date: 12/03/2020  Admitted From: Home Disposition: Home  Recommendations for Outpatient Follow-up:  1. Follow up with PCP in 1 week with repeat CBC/BMP/INR.  Coumadin dose to be adjusted as per INR 2. Outpatient follow-up with cardiology/GI. 3. Recommend outpatient evaluation and follow-up by neurology  4. follow up in ED if symptoms worsen or new appear   Home Health: Home with PT Equipment/Devices: None  Discharge Condition: Stable CODE STATUS: Full Diet recommendation: Heart healthy  Brief/Interim Summary: 60 y.o.femalewith medical history significant ofmechanical mitral and tricuspid valve replacement in 2014 currently on Coumadin, history of alcohol abuse, chronic diastolic CHF, pyloric stenosis, marijuana use, history of stroke presented with repeated nausea vomiting and diarrhea and hematemesis. Upon arrival to ED, she was hemodynamically stable. CT head was unremarkable.She had elevated LFTs for which ultrasound abdomen was done which showed fatty liver similar to last year. She had mild AKI as well as supratherapeutic INR of 8.2. She received 2.5 mg of oral vitamin K. GI was consulted. INR was still elevated over 7 the next day so she received another dose of vitamin K 5 mg IV. INR dropped to 2.1. Hemoglobin also dropped from 9.9 at the time of admission to 6.4 on 11/26/2020 for which patient received 1 unit of PRBC transfusion. Due to complicated cardiac history, cardiology was consulted who recommended that patient should be on anticoagulation at all times unless there is a life-threatening emergency. Due to subtherapeutic INR (goal INR 2.5-3.5 ) patient was bridged with IV heparin. She has not had any further hematemesis. Status post EGD 11/27/2020 which shows pyloric stenosis. This was dilated by GIup to 10 mm. No  other source of bleeding. She was started backon Coumadinwith heparin drip as bridge. INR therapeutic, heparin discontinued.  PT/OT initially recommended SNF.  She complained of persistent right hip pain for which right hip CT showed joint effusion.  Prior hospitalist discussed with Dr. Jacques Earthly who recommended IR consult for arthrocentesis.  IR attempted arthrocentesis on 12/01/2020 but no fluid could be removed.  Orthopedics evaluated the patient and recommended physical therapy and possible outpatient neurology evaluation.  PT is now recommending home health PT.  Patient will be discharged home today.  Discharge Diagnoses:   Nausea and vomiting/upper GI bleed/acute blood loss anemia/pyloric stenosis -Hemoglobin dropped from 9.9 at the time of admission to 6.4 on 11/26/2020 for which patient received 1 unit of PRBC transfusion.  -She has not had any further hematemesis.  -Status post EGD on 11/27/2020 which showed pyloric stenosis. This was dilated to 10 mm. No other source of bleeding.  -Hemoglobin is currently stable.  -Continue twice daily Protonix and monitor CBC daily  -Reglan discontinued by GI. Tolerating soft diet now.  GI has signed off -Outpatient follow-up with GI.  History of mechanical mitral and tricuspid valve Supratherapeutic INR: Resolved -Cardiology recommended that patient must be anticoagulated unless she has life-threatening hemorrhage and recommended to start her on heparin if her INR falls less than 2.5.  -Patient was given FFP on 12/01/2020 for possible arthrocentesis -INR is 2.1 today.  Currently getting Coumadin.  Will add therapeutic Lovenox twice daily and discharge patient on Coumadin and Lovenox with outpatient follow-up with Coumadin clinic with repeat INR.  Once INR is therapeutic between 2.5-3.5, Lovenox can be discontinued.  Mild AKI -Resolved. Outpatient follow-up  Elevated LFTs: -Elevated AST/ALT ratio to implying active drinking.  However  patient denied. -RUQ ultrasound showed fatty liver similar as last year. LFTs improving. No abdominal pain or tenderness.  History of alcohol abuse -No symptoms signs of acute withdrawal; CIWA discontinued.  Chronic diastolic CHF  -Looks euvolemic.   Outpatient follow-up with cardiology.  History of unspecified stroke: Patient tells me that she had a stroke during Thanksgiving 2021 and since then she has been having slurred speech.  Outpatient follow-up with PCP/neurology  Marijuana abuse: Counseled by prior hospitalist.  Right lower extremity weakness - Patient complained of right lower extremity weakness since about 1 to 2 weeks. She also complains of low back pain which also started about 2 weeks ago. She tells me that she had lumbar puncture done during Thanksgiving when she had stroke. Her history is very vague.  -CT right hip was obtained which showed right hip effusion. Orthopedic/Dr. Alvan Dame recommended IR for arthrocentesis.   IR attempted arthrocentesis on 12/01/2020 but was unsuccessful.  - Orthopedics/Dr. Alvan Dame evaluated the patient and recommended physical therapy and possible outpatient neurology evaluation.  -Discharge patient home today with home health PT. Discharge Instructions  Discharge Instructions    Ambulatory referral to Cardiology   Complete by: As directed    Hospital follow-up   Ambulatory referral to Gastroenterology   Complete by: As directed    Hospital follow-up   Ambulatory referral to Neurology   Complete by: As directed    An appointment is requested in approximately: In 1 to 2 weeks for lower extremity weakness   Diet - low sodium heart healthy   Complete by: As directed    Increase activity slowly   Complete by: As directed      Allergies as of 12/03/2020      Reactions   Oxycodone Itching   Tape Rash, Other (See Comments)   THE ONLY TAPE THAT IS TOLERATED IS PAPER TAPE. EKG leads inflame the skin      Medication List    TAKE  these medications   acetaminophen 500 MG tablet Commonly known as: TYLENOL Take 1 tablet (500 mg total) by mouth every 6 (six) hours as needed for mild pain or headache.   enoxaparin 60 MG/0.6ML injection Commonly known as: LOVENOX Inject 0.55 mLs (55 mg total) into the skin every 12 (twelve) hours for 5 days.   folic acid 1 MG tablet Commonly known as: FOLVITE Take 1 tablet (1 mg total) by mouth daily.   gabapentin 100 MG capsule Commonly known as: NEURONTIN Take 1 capsule (100 mg total) by mouth 2 (two) times daily. And continue 300 mg at bedtime   gabapentin 300 MG capsule Commonly known as: NEURONTIN Take 1 capsule (300 mg total) by mouth at bedtime.   multivitamin with minerals Tabs tablet Take 1 tablet by mouth daily. Start taking on: December 04, 2020   oxyCODONE 5 MG immediate release tablet Commonly known as: Roxicodone Take 1 tablet (5 mg total) by mouth every 6 (six) hours as needed for severe pain.   pantoprazole 40 MG tablet Commonly known as: PROTONIX Take 1 tablet (40 mg total) by mouth 2 (two) times daily. What changed: when to take this   thiamine 100 MG tablet Take 1 tablet (100 mg total) by mouth daily.   Vitamin B12 500 MCG Tabs Take 500 mcg by mouth daily.   warfarin 5 MG tablet Commonly known as: COUMADIN Take as directed. If you are unsure how to take this medication, talk to your nurse or doctor. Original instructions: Take 2.5mg  to  5mg  daily or as directed by Coumadin clinic What changed:   how much to take  how to take this  when to take this  additional instructions       Follow-up [facility] on 12/31/2020.   Why: Please attend your hospital follow up appointment with Dr Wynetta Emery on Thursday, April 7, at 2:30pm. Contact information: [place] [address] [phone]       [name], MD .   Specialty: Internal Medicine Contact  information: [place] [address] [phone]        Evans Lance, MD .   Specialty: Cardiology Contact information: [phone] [place] [address] [phone]        Jerline Pain, MD .   Specialty: Cardiology Contact information: [phone] [place] [address] [phone]              Allergies  Allergen Reactions   Oxycodone Itching   Tape Rash and Other (See Comments)    THE ONLY TAPE THAT IS TOLERATED IS PAPER TAPE. EKG leads inflame the skin    Consultations:  GI/orthopedics/cardiology   Procedures/Studies: CT HEAD WO CONTRAST  Result Date: 11/24/2020 CLINICAL DATA:  Mental status change. EXAM: CT HEAD WITHOUT CONTRAST TECHNIQUE: Contiguous axial images were obtained from the base of the skull through the vertex without intravenous contrast. COMPARISON:  08/14/2020 FINDINGS: Brain: Mild generalized atrophy. Negative for hydrocephalus. Negative for acute infarct, hemorrhage, mass. Vascular: Negative for hyperdense vessel Skull: Negative Sinuses/Orbits: Visualized paranasal sinuses clear. Orbits not included on the study Other: None IMPRESSION: Generalized atrophy.  No acute abnormality and no interval change. Electronically Signed   By: Franchot Gallo M.D.   On: 11/24/2020 13:40   CT LUMBAR SPINE WO CONTRAST  Result Date: 11/25/2020 CLINICAL DATA:  Low back pain with right leg pain. EXAM: CT LUMBAR SPINE WITHOUT CONTRAST TECHNIQUE: Multidetector CT imaging of the lumbar spine was performed without intravenous contrast administration. Multiplanar CT image reconstructions were also generated. COMPARISON:  CT abdomen pelvis 04/23/2020 FINDINGS: Segmentation: Normal Alignment: Normal Vertebrae: Negative for fracture or mass Paraspinal and other soft tissues: Negative for paraspinous mass or adenopathy. Surgical clips in the gallbladder fossa. Large area of lobular soft tissue density in the pelvis  bilaterally left greater than right. This does not contain fat or gas. This could be adherent bowel loops versus a pelvic mass. Similar changes seen in the right pelvis on the prior study but not in the left pelvis. Disc levels: L1-2: Negative L2-3: Negative L3-4: Mild disc bulging.  Negative for stenosis. L4-5: Mild disc bulging.  Negative for disc protrusion or stenosis. L5-S1: Negative IMPRESSION: Very mild lumbar degenerative change. Negative for neural impingement or stenosis. Lobular large areas of soft tissue density in the pelvis bilaterally which could be adherent bowel loops versus a large pelvic mass. Recommend CT abdomen pelvis with oral and IV contrast for further evaluation. Electronically Signed   By: Franchot Gallo M.D.   On: 11/25/2020 14:43   CT ABDOMEN PELVIS W CONTRAST  Result Date: 11/26/2020 CLINICAL DATA:  Abdominal mass. Anemia and vomiting. CT of the lumbar spine demonstrated adherent bowel loops versus pelvic masses. EXAM: CT ABDOMEN AND PELVIS WITH CONTRAST TECHNIQUE: Multidetector CT imaging of the abdomen and pelvis was performed using the standard protocol following bolus administration of intravenous contrast. CONTRAST:  152mL OMNIPAQUE IOHEXOL 300  MG/ML  SOLN COMPARISON:  CT of the lumbar spine on 11/25/2020 and prior CT of the abdomen and pelvis with contrast on 04/23/2020. FINDINGS: Lower chest: No acute abnormality. Hepatobiliary: There is evidence of diffuse hepatic steatosis. No overt cirrhotic morphology. No hepatic masses or biliary dilatation. The gallbladder has been removed. Pancreas: Unremarkable. No pancreatic ductal dilatation or surrounding inflammatory changes. Spleen: Normal in size without focal abnormality. Adrenals/Urinary Tract: Adrenal glands are unremarkable. Kidneys are normal, without renal calculi, focal lesion, or hydronephrosis. Bladder is unremarkable. Stomach/Bowel: Bowel shows no evidence of obstruction, ileus, inflammation or lesion. No free air  identified. Vascular/Lymphatic: No significant vascular findings are present. No enlarged abdominal or pelvic lymph nodes. Reproductive: Uterus and bilateral adnexa are unremarkable. Small amount of free fluid in the pelvis. There is no evidence of pelvic mass with prior lumbar CT findings representing bowel loops as well as some adjacent free fluid. Other: Focal midline upper abdominal ventral hernia contains fat. No evidence of focal abscess. Musculoskeletal: No acute or significant osseous findings. IMPRESSION: 1. Hepatic steatosis. 2. Small amount of free fluid in the pelvis. 3. Focal midline upper abdominal ventral hernia containing fat. 4. No evidence of pelvic masses. Lumbar CT findings are secondary to unopacified bowel loops. Electronically Signed   By: Aletta Edouard M.D.   On: 11/26/2020 16:53   CT HIP RIGHT WO CONTRAST  Result Date: 12/01/2020 CLINICAL DATA:  Right hip pain.  Question infection. EXAM: CT OF THE RIGHT HIP WITHOUT CONTRAST TECHNIQUE: Multidetector CT imaging of the right hip was performed according to the standard protocol. Multiplanar CT image reconstructions were also generated. COMPARISON:  CT abdomen and pelvis 11/26/2020. FINDINGS: Bones/Joint/Cartilage There is no acute bony abnormality. No bony destructive change or periosteal reaction. Hip joint space is preserved with minimal subchondral cyst formation in the periphery of the acetabulum consistent with mild degenerative change noted. The patient has a moderate right hip joint effusion which was present on the prior exam. Ligaments Suboptimally assessed by CT. Muscles and Tendons No intramuscular fluid collection or mass. No evidence of tear strain. Soft tissues There is edema in subcutaneous fatty tissues about the hip. Imaged intrapelvic contents are unremarkable. IMPRESSION: Moderate right hip effusion could be septic or aseptic. As there is clinical concern for infection, right hip joint aspiration is recommended. No bony  findings to suggest osteomyelitis. Electronically Signed   By: Inge Rise M.D.   On: 12/01/2020 08:34   DG Chest Portable 1 View  Result Date: 11/24/2020 CLINICAL DATA:  Weakness with anterior chest wall pain. EXAM: PORTABLE CHEST 1 VIEW COMPARISON:  05/04/2019 FINDINGS: 1415 hours. The lungs are clear without focal pneumonia, edema, pneumothorax or pleural effusion. The cardiopericardial silhouette is within normal limits for size. Status post cardiac valve replacement. Telemetry leads overlie the chest. IMPRESSION: No active disease. Electronically Signed   By: Misty Stanley M.D.   On: 11/24/2020 14:53   DG FLUORO GUIDED NEEDLE PLC ASPIRATION/INJECTION LOC  Result Date: 12/01/2020 CLINICAL DATA:  Right hip effusion on CT. EXAM: RIGHT HIP ASPIRATION UNDER FLUOROSCOPY COMPARISON:  None. TECHNIQUE: An appropriate skin entrance site was determined. The site was marked, prepped with Betadine, draped in the usual sterile fashion, and infiltrated locally with buffered Lidocaine. 22 gauge spinal needle was advanced to the superolateral junction of the femoral head and neck. FLUOROSCOPY TIME:  Fluoroscopy Time:  1 minutes and 12 seconds. Radiation Exposure Index (if provided by the fluoroscopic device): 2.7 mGy PROCEDURE: See below. FINDINGS: Risks benefits and  alternatives to the procedure were discussed with the patient. Emphasized risks included bleeding, infection, and inability to obtain joint fluid. Bleeding risk was further emphasized due to the patient's elevated INR. Patient voiced understanding and wished to proceed. Written informed consent was obtained. Prior to beginning the procedure, a time-out was performed to confirm patient identity and procedure site. Using fluoroscopic, appropriate skin site over the right hip was identified. The region was prepped and draped in the usual sterile fashion. Anesthesia of the skin and deeper soft tissues was obtained using 1% lidocaine. 22 gauge spinal needle  was advanced to the anterior cortex of the lateral femoral head at the junction with the femoral neck without the ability to aspirate joint fluid. 2 additional passes were made with the 22 gauge spinal needle to the lateral junction of the femoral head/neck region. Neither allowed acquisition of joint fluid. I tried to opacify the joint space with a small amount of iodinated contrast material, but was unable. At this point, the patient stated that she could not tolerate the procedure any longer and requested that we stop. IMPRESSION: Unsuccessful attempt at right hip aspiration. Needle placement appeared appropriate based on fluoroscopic confirmation, but no joint fluid could be aspirated. The patient asked that the procedure be terminated before fluid could be obtained. No evidence for immediate complicating features. Electronically Signed   By: Misty Stanley M.D.   On: 12/01/2020 15:51   US Abdomen Limited RUQ (LIVER/GB)  Result Date: 11/24/2020 CLINICAL DATA:  Elevated LFTs EXAM: ULTRASOUND ABDOMEN LIMITED RIGHT UPPER QUADRANT COMPARISON:  04/23/2020 FINDINGS: Gallbladder: Surgically removed. Common bile duct: Diameter: 4 mm Liver: Increased echogenicity is noted consistent with fatty infiltration. No focal mass is noted. Portal vein is patent on color Doppler imaging with normal direction of blood flow towards the liver. Other: None. IMPRESSION: Fatty liver. Status post cholecystectomy. Electronically Signed   By: Inez Catalina M.D.   On: 11/24/2020 16:29     EGD on 11/27/2020  Subjective: Patient seen and examined at bedside.  Poor historian.  Denies overnight fever, nausea, vomiting or worsening shortness of breath.  Feels okay going home today.  Discharge Exam: Vitals:   12/02/20 1956 12/03/20 0517  BP: (!) 143/75 140/69  Pulse: 79 76  Resp: 20 20  Temp: 99.2 F (37.3 C) 98.9 F (37.2 C)  SpO2: 100% 99%    General: Pt is alert, awake, not in acute distress.  Poor historian.  Currently on  room air Cardiovascular: rate controlled, S1/S2 + Respiratory: bilateral decreased breath sounds at bases with some scattered crackles Abdominal: Soft, NT, ND, bowel sounds + Extremities: Trace lower extremity edema; no cyanosis    The results of significant diagnostics from this hospitalization (including imaging, microbiology, ancillary and laboratory) are listed below for reference.     Microbiology: Recent Results (from the past 240 hour(s))  SARS CORONAVIRUS 2 (TAT 6-24 HRS) Nasopharyngeal Nasopharyngeal Swab     Status: None   Collection Time: 11/24/20 10:18 PM   Specimen: Nasopharyngeal Swab  Result Value Ref Range Status   SARS Coronavirus 2 NEGATIVE NEGATIVE Final    Comment: (NOTE) SARS-CoV-2 target nucleic acids are NOT DETECTED.  The SARS-CoV-2 RNA is generally detectable in upper and lower respiratory specimens during the acute phase of infection. Negative results do not preclude SARS-CoV-2 infection, do not rule out co-infections with other pathogens, and should not be used as the sole basis for treatment or other patient management decisions. Negative results must be combined with clinical  observations, patient history, and epidemiological information. The expected result is Negative.  Fact Sheet for Patients: SugarRoll.be  Fact Sheet for Healthcare Providers: https://www.woods-mathews.com/  This test is not yet approved or cleared by the [place] FDA and  has been authorized for detection and/or diagnosis of SARS-CoV-2 by FDA under an Emergency Use Authorization (EUA). This EUA will remain  in effect (meaning this test can be used) for the duration of the COVID-19 declaration under Se ction 564(b)(1) of the Act, 21 U.S.C. section 360bbb-3(b)(1), unless the authorization is terminated or revoked sooner.  Performed at [facility], [place] [address], [place], [place] [address]      Labs: BNP (last 3  results) Recent Labs    12/29/19 1136 08/18/20 2152  BNP 44.5 67.8   Basic Metabolic Panel: Recent Labs  Lab 11/27/20 0325 11/28/20 0110 11/29/20 0358 11/30/20 0212 12/03/20 0620  NA 135 137 139 139 138  K 5.1 5.4* 4.6 4.3 4.0  CL 111 114* 113* 111 106  CO2 18* 17* 18* 19* 27  GLUCOSE 97 73 75 84 89  BUN 6 6 5* <5* 5*  CREATININE 1.13* 0.88 0.78 0.93 0.83  CALCIUM 8.5* 8.5* 8.2* 8.4* 8.6*  MG 1.9 1.7  --   --  1.4*  PHOS  --  3.2  --   --   --    Liver Function Tests: Recent Labs  Lab 11/27/20 0325 11/28/20 0110  AST 35 32  ALT 48* 38  ALKPHOS 149* 129*  BILITOT 1.2 0.9  PROT 5.1* 5.0*  ALBUMIN 2.3* 2.2*   No results for input(s): LIPASE, AMYLASE in the last 168 hours. No results for input(s): AMMONIA in the last 168 hours. CBC: Recent Labs  Lab 11/27/20 1529 11/28/20 0110 11/29/20 0358 11/30/20 0212 12/01/20 0107 12/02/20 0500 12/03/20 0620  WBC 6.4   < > 5.6 5.8 6.5 5.0 5.0  NEUTROABS 4.4  --   --   --   --   --  2.9  HGB 9.2*   < > 7.9* 8.7* 8.4* 7.4* 7.8*  HCT 26.3*   < > 23.1* 25.4* 23.8* 20.7* 22.7*  MCV 108.7*   < > 109.0* 108.5* 108.2* 107.8* 110.7*  PLT 241   < > 241 304 273 212 223   < > = values in this interval not displayed.   Cardiac Enzymes: No results for input(s): CKTOTAL, CKMB, CKMBINDEX, TROPONINI in the last 168 hours. BNP: Invalid input(s): POCBNP CBG: No results for input(s): GLUCAP in the last 168 hours. D-Dimer No results for input(s): DDIMER in the last 72 hours. Hgb A1c No results for input(s): HGBA1C in the last 72 hours. Lipid Profile No results for input(s): CHOL, HDL, LDLCALC, TRIG, CHOLHDL, LDLDIRECT in the last 72 hours. Thyroid function studies No results for input(s): TSH, T4TOTAL, T3FREE, THYROIDAB in the last 72 hours.  Invalid input(s): FREET3 Anemia work up No results for input(s): VITAMINB12, FOLATE, FERRITIN, TIBC, IRON, RETICCTPCT in the last 72 hours. Urinalysis    Component Value Date/Time    COLORURINE AMBER (A) 11/24/2020 1600   APPEARANCEUR CLOUDY (A) 11/24/2020 1600   LABSPEC 1.025 11/24/2020 1600   PHURINE 5.0 11/24/2020 1600   GLUCOSEU NEGATIVE 11/24/2020 1600   HGBUR MODERATE (A) 11/24/2020 1600   BILIRUBINUR NEGATIVE 11/24/2020 1600   KETONESUR 5 (A) 11/24/2020 1600   PROTEINUR 30 (A) 11/24/2020 1600   UROBILINOGEN 0.2 06/04/2015 1533   NITRITE NEGATIVE 11/24/2020 1600   LEUKOCYTESUR NEGATIVE 11/24/2020 1600  Sepsis Labs Invalid input(s): PROCALCITONIN,  WBC,  LACTICIDVEN Microbiology Recent Results (from the past 240 hour(s))  SARS CORONAVIRUS 2 (TAT 6-24 HRS) Nasopharyngeal Nasopharyngeal Swab     Status: None   Collection Time: 11/24/20 10:18 PM   Specimen: Nasopharyngeal Swab  Result Value Ref Range Status   SARS Coronavirus 2 NEGATIVE NEGATIVE Final    Comment: (NOTE) SARS-CoV-2 target nucleic acids are NOT DETECTED.  The SARS-CoV-2 RNA is generally detectable in upper and lower respiratory specimens during the acute phase of infection. Negative results do not preclude SARS-CoV-2 infection, do not rule out co-infections with other pathogens, and should not be used as the sole basis for treatment or other patient management decisions. Negative results must be combined with clinical observations, patient history, and epidemiological information. The expected result is Negative.  Fact Sheet for Patients: SugarRoll.be  Fact Sheet for Healthcare Providers: https://www.woods-mathews.com/  This test is not yet approved or cleared by the [place] FDA and  has been authorized for detection and/or diagnosis of SARS-CoV-2 by FDA under an Emergency Use Authorization (EUA). This EUA will remain  in effect (meaning this test can be used) for the duration of the COVID-19 declaration under Se ction 564(b)(1) of the Act, 21 U.S.C. section 360bbb-3(b)(1), unless the authorization is terminated or revoked  sooner.  Performed at [facility], [place] [address] [place], [place] [address]      Time coordinating discharge: 35 minutes  SIGNED:   Aline August, MD  [facility] 12/03/2020, 10:13 AM

## 2020-12-03 NOTE — Consult Note (Signed)
Reason for Consult: right hip pain rule out septic joint Referring Physician: Starla Link, MD (Hopsitalist)  Mary [name] is an 60 y.o. female.  HPI:  60 y.o. female with medical history significant of mechanical mitral and tricuspid valve replacement 2014 on Coumadin, history of alcohol abuse, chronic diastolic CHF, pyloric stenosis, marijuana use, presented with repeated nausea vomiting and diarrhea.  During hospitalization she had complaints of right lower extremity pain and heaviness. Orthopaedics consulted for evaluation  Complains of right lower extremity pain and heaviness. States that she was told that strokes were mini strokes and she was not aware of any residual deficits Based on CT scan revealing effusion and concern reported to rule out infection I had the primary team order IR aspiration of the right hip.  Past Medical History:  Diagnosis Date  . Abnormal liver function   . Acute diastolic heart failure ([place]) 03/15/2013  . Alcoholism ([place])   . Anemia   . B12 deficiency   . Carotid stenosis    Carotid US 2/17: bilateral ICA 1-39% >> FU prn  . Childhood asthma   . Chronic diastolic CHF (congestive heart failure) ([place])   . Complete heart block ([place])   . Epigastric hernia 200's  . GERD (gastroesophageal reflux disease)   . History of blood transfusion    "14 w/1st pregnancy; 2 w/last C-section" (03/15/2013)  . Hx of echocardiogram    a. Echo 4/16:  Mild focal basal septal hypertrophy, EF 60-65%, no RWMA, Mechanical MVR ok with mild central regurgitation and no perivalvular leak, small mobile density attached to valvular ring (1.5x1 cm) - post surgical changes vs SBE, mild LAE;   b. Echo 2/17:  Mild post wall LVH, EF 55-60%, no RWMA, mechanical MVR ok (mean 5 mmHg), normal RVSF  . Hypertension    no pcp   will go to [facility]  . Macrocytic anemia   . Protein calorie malnutrition ([place])   . 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 S/P mitral valve replacement with  metallic valve 3/76/2831   64mm Sorin Carbomedics mechanical prosthesis via right mini thoracotomy approach  . 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   Complex valvuloplasty including Cor-matrix ECM patch augmentation of anterior and lateral leaflets with 20mm Edwards mc3 ring annuloplasty via right mini-thoracotomy approach  . Severe mitral regurgitation 04/22/2013  . Sinus tachycardia   . SVT (supraventricular tachycardia) ([place]) 03/16/2013  . TIA (transient ischemic attack)   . Tricuspid regurgitation     Past Surgical History:  Procedure Laterality Date  . APPENDECTOMY  [id number]  . BALLOON DILATION N/A 11/27/2020   Procedure: BALLOON DILATION;  Surgeon: Gatha Mayer, MD;  Location: [facility] ENDOSCOPY;  Service: Endoscopy;  Laterality: N/A;  . BIOPSY  05/01/2020   Procedure: BIOPSY;  Surgeon: Milus Banister, MD;  Location: [place];  Service: Endoscopy;;  . CARDIAC CATHETERIZATION    . CARDIAC VALVE REPLACEMENT    . CESAREAN SECTION  1983  . CESAREAN SECTION WITH BILATERAL TUBAL LIGATION  1999  . COLONOSCOPY WITH PROPOFOL N/A 05/01/2020   Procedure: COLONOSCOPY WITH PROPOFOL;  Surgeon: Milus Banister, MD;  Location[facility] ENDOSCOPY;  Service: Endoscopy;  Laterality: N/A;  . ESOPHAGOGASTRODUODENOSCOPY (EGD) WITH PROPOFOL N/A 05/01/2020   Procedure: ESOPHAGOGASTRODUODENOSCOPY (EGD) WITH PROPOFOL;  Surgeon: Milus Banister, MD;  Location[facility] ENDOSCOPY;  Service: Endoscopy;  Laterality: N/A;  . ESOPHAGOGASTRODUODENOSCOPY (EGD) WITH PROPOFOL N/A 11/27/2020   Procedure: ESOPHAGOGASTRODUODENOSCOPY (EGD) WITH PROPOFOL;  Surgeon: Gatha Mayer, MD;  Location: [place];  Service: Endoscopy;  Laterality: N/A;  . FEMORAL HERNIA REPAIR Right 05/23/2013   Procedure: HERNIA REPAIR FEMORAL;  Surgeon: Rexene Alberts, MD;  Location: [place];  Service: Open Heart Surgery;  Laterality: Right;  . INTRAOPERATIVE TRANSESOPHAGEAL ECHOCARDIOGRAM N/A 05/23/2013   Procedure: INTRAOPERATIVE TRANSESOPHAGEAL ECHOCARDIOGRAM;   Surgeon: Rexene Alberts, MD;  Location: [place];  Service: Open Heart Surgery;  Laterality: N/A;  . LAPAROSCOPIC CHOLECYSTECTOMY  2003  . LEFT AND RIGHT HEART CATHETERIZATION WITH CORONARY ANGIOGRAM N/A 03/22/2013   Procedure: LEFT AND RIGHT HEART CATHETERIZATION WITH CORONARY ANGIOGRAM;  Surgeon: Burnell Blanks, MD;  Location: [facility] CATH LAB;  Service: Cardiovascular;  Laterality: N/A;  . MINIMALLY INVASIVE TRICUSPID VALVE REPAIR Right 05/23/2013   Procedure: MINIMALLY INVASIVE TRICUSPID VALVE REPAIR;  Surgeon: Rexene Alberts, MD;  Location: [place];  Service: Open Heart Surgery;  Laterality: Right;  . MITRAL VALVE REPLACEMENT N/A 05/23/2013   Procedure: MITRAL VALVE (MV) REPLACEMENT;  Surgeon: Rexene Alberts, MD;  Location: [place];  Service: Open Heart Surgery;  Laterality: N/A;  . MULTIPLE EXTRACTIONS WITH ALVEOLOPLASTY N/A 04/04/2013   Procedure: Extraction of tooth #'s 1,8,9,13,14,15,23,24,25,26 with alveoloplasty and gross debridement of remaining teeth;  Surgeon: Lenn Cal, DDS;  Location: [place];  Service: Oral Surgery;  Laterality: N/A;  . TEE WITHOUT CARDIOVERSION N/A 03/18/2013   Procedure: TRANSESOPHAGEAL ECHOCARDIOGRAM (TEE);  Surgeon: Larey Dresser, MD;  Location: [place];  Service: Cardiovascular;  Laterality: N/A;  . TEE WITHOUT CARDIOVERSION N/A 06/17/2013   Procedure: TRANSESOPHAGEAL ECHOCARDIOGRAM (TEE);  Surgeon: Lelon Perla, MD;  Location: [facility] ENDOSCOPY;  Service: Cardiovascular;  Laterality: N/A;  . TUBAL LIGATION  1999    Family History  Problem Relation Age of Onset  . Stroke Father   . Cancer Father   . Sarcoidosis Sister   . Heart attack Neg Hx   . Colon cancer Neg Hx   . Stomach cancer Neg Hx   . Pancreatic cancer Neg Hx   . Esophageal cancer Neg Hx     Social History:  reports that she quit smoking about 7 years ago. Her smoking use included cigarettes. She has a 18.00 pack-year smoking history. She has never used smokeless tobacco. She reports  current alcohol use. She reports that she does not use drugs.  Allergies:  Allergies  Allergen Reactions  . Oxycodone Itching  . Tape Rash and Other (See Comments)    THE ONLY TAPE THAT IS TOLERATED IS PAPER TAPE. EKG leads inflame the skin    Medications:  I have reviewed the patient's current medications. Scheduled: . enoxaparin (LOVENOX) injection  1 mg/kg Subcutaneous Q12H  . folic acid  1 mg Oral Daily  . gabapentin  100 mg Oral Daily   And  . gabapentin  400 mg Oral QHS  . multivitamin with minerals  1 tablet Oral Daily  . pantoprazole  40 mg Oral BID  . sodium chloride flush  10-40 mL Intracatheter Q12H  . thiamine  100 mg Oral Daily  . vitamin B-12  500 mcg Oral Daily  . warfarin  2.5 mg Oral ONCE-1600  . Warfarin - Pharmacist Dosing Inpatient   Does not apply q1600    Results for orders placed or performed during the hospital encounter of 11/24/20 (from the past 24 hour(s))  Protime-INR     Status: Abnormal   Collection Time: 12/03/20  6:20 AM  Result Value Ref Range   Prothrombin Time 23.0 (H) 11.4 - 15.2 seconds   INR  2.1 (H) 0.8 - 1.2  CBC with Differential/Platelet     Status: Abnormal   Collection Time: 12/03/20  6:20 AM  Result Value Ref Range   WBC 5.0 4.0 - 10.5 K/uL   RBC 2.05 (L) 3.87 - 5.11 MIL/uL   Hemoglobin 7.8 (L) 12.0 - 15.0 g/dL   HCT 22.7 (L) 36.0 - 46.0 %   MCV 110.7 (H) 80.0 - 100.0 fL   MCH 38.0 (H) 26.0 - 34.0 pg   MCHC 34.4 30.0 - 36.0 g/dL   RDW 22.2 (H) 11.5 - 15.5 %   Platelets 223 150 - 400 K/uL   nRBC 0.0 0.0 - 0.2 %   Neutrophils Relative % 59 %   Neutro Abs 2.9 1.7 - 7.7 K/uL   Lymphocytes Relative 24 %   Lymphs Abs 1.2 0.7 - 4.0 K/uL   Monocytes Relative 13 %   Monocytes Absolute 0.7 0.1 - 1.0 K/uL   Eosinophils Relative 3 %   Eosinophils Absolute 0.2 0.0 - 0.5 K/uL   Basophils Relative 1 %   Basophils Absolute 0.0 0.0 - 0.1 K/uL   Immature Granulocytes 0 %   Abs Immature Granulocytes 0.02 0.00 - 0.07 K/uL    Polychromasia PRESENT   Basic metabolic panel     Status: Abnormal   Collection Time: 12/03/20  6:20 AM  Result Value Ref Range   Sodium 138 135 - 145 mmol/L   Potassium 4.0 3.5 - 5.1 mmol/L   Chloride 106 98 - 111 mmol/L   CO2 27 22 - 32 mmol/L   Glucose, Bld 89 70 - 99 mg/dL   BUN 5 (L) 6 - 20 mg/dL   Creatinine, Ser 0.83 0.44 - 1.00 mg/dL   Calcium 8.6 (L) 8.9 - 10.3 mg/dL   GFR, Estimated >60 >60 mL/min   Anion gap 5 5 - 15  Magnesium     Status: Abnormal   Collection Time: 12/03/20  6:20 AM  Result Value Ref Range   Magnesium 1.4 (L) 1.7 - 2.4 mg/dL     X-ray: CLINICAL DATA:  Right hip pain.  Question infection.  EXAM: CT OF THE RIGHT HIP WITHOUT CONTRAST  TECHNIQUE: Multidetector CT imaging of the right hip was performed according to the standard protocol. Multiplanar CT image reconstructions were also generated.  COMPARISON:  CT abdomen and pelvis 11/26/2020.  FINDINGS: Bones/Joint/Cartilage  There is no acute bony abnormality. No bony destructive change or periosteal reaction. Hip joint space is preserved with minimal subchondral cyst formation in the periphery of the acetabulum consistent with mild degenerative change noted. The patient has a moderate right hip joint effusion which was present on the prior exam.  Ligaments  Suboptimally assessed by CT.  Muscles and Tendons  No intramuscular fluid collection or mass. No evidence of tear strain.  Soft tissues  There is edema in subcutaneous fatty tissues about the hip. Imaged intrapelvic contents are unremarkable.  IMPRESSION: Moderate right hip effusion could be septic or aseptic. As there is clinical concern for infection, right hip joint aspiration is recommended.  No bony findings to suggest osteomyelitis.   Electronically Signed   By: Inge Rise M.D.  CLINICAL DATA:  Right hip effusion on CT.  EXAM: RIGHT HIP ASPIRATION UNDER FLUOROSCOPY  COMPARISON:   None.  TECHNIQUE: An appropriate skin entrance site was determined. The site was marked, prepped with Betadine, draped in the usual sterile fashion, and infiltrated locally with buffered Lidocaine. 22 gauge spinal needle was advanced to the superolateral junction  of the femoral head and neck.  FLUOROSCOPY TIME:  Fluoroscopy Time:  1 minutes and 12 seconds.  Radiation Exposure Index (if provided by the fluoroscopic device): 2.7 mGy  PROCEDURE: See below.  FINDINGS: Risks benefits and alternatives to the procedure were discussed with the patient. Emphasized risks included bleeding, infection, and inability to obtain joint fluid. Bleeding risk was further emphasized due to the patient's elevated INR. Patient voiced understanding and wished to proceed. Written informed consent was obtained.  Prior to beginning the procedure, a time-out was performed to confirm patient identity and procedure site.  Using fluoroscopic, appropriate skin site over the right hip was identified. The region was prepped and draped in the usual sterile fashion. Anesthesia of the skin and deeper soft tissues was obtained using 1% lidocaine. 22 gauge spinal needle was advanced to the anterior cortex of the lateral femoral head at the junction with the femoral neck without the ability to aspirate joint fluid.  2 additional passes were made with the 22 gauge spinal needle to the lateral junction of the femoral head/neck region. Neither allowed acquisition of joint fluid. I tried to opacify the joint space with a small amount of iodinated contrast material, but was unable.  At this point, the patient stated that she could not tolerate the procedure any longer and requested that we stop.  IMPRESSION: Unsuccessful attempt at right hip aspiration. Needle placement appeared appropriate based on fluoroscopic confirmation, but no joint fluid could be aspirated. The patient asked that the procedure be  terminated before fluid could be obtained.  No evidence for immediate complicating features.   Electronically Signed   By: Misty Stanley M.D.  ROS - per HPI  Blood pressure 140/69, pulse 76, temperature 98.9 F (37.2 C), temperature source Oral, resp. rate 20, height 5\' 2"  (1.575 m), weight 55.7 kg, last menstrual period 03/22/2013, SpO2 99 %.  Physical Exam: Awake alert when seen last night AFVSS Very slow to mobilize in her bed In supine position no significant pain other than mild discomfort with log roll motion followed be hip flexion internal and external rotation Mild knee effusion without warmth or erythema Mild, 2+ BLE edmea with erythema  Assessment/Plan: 1. Right hip pain without clear etiology but NO current clinical concern for septic joint - (NEGATIVE aspiration , negative exam findings) 2. Questionable source of RLE weakness whether centrally or axially related  Plan: Would continue to have her work with therapy to improve mobility ? Outpatient neurology workup given history  No surgical intervention needed No evidence of right hip OA requiring further workup  Mauri Pole 12/03/2020, 9:08 AM

## 2020-12-03 NOTE — TOC Transition Note (Signed)
Transition of Care [facility]) - CM/SW Discharge Note   Patient Details  Name: Mary [name] MRN: [id number] Date of Birth: 1961/08/16  Transition of Care [facility] CM/SW Contact:  Joanne Chars, LCSW Phone Number: 12/03/2020, 1:40 PM   Clinical Narrative:   Pt discharging home with home health from [facility].  No equipment needs. Son will meet pt at home to assist.  [facility] transportation providing transport.  Medications through [facility] pharmacy/[facility].      Final next level of care: [employer] Barriers to Discharge: Barriers Resolved   Patient Goals and CMS Choice Patient states their goals for this hospitalization and ongoing recovery are:: Pt states she wants to return home when possible. CMS Medicare.gov Compare Post Acute Care list provided to:: Patient Choice offered to / list presented to : Patient  Discharge Placement                Patient to be transferred to facility by: [facility] Transportation      Discharge Plan and Services     Post Acute Care Choice: [place]          DME Arranged: N/A         HH Arranged: PT HH Agency: [facility] Date [place]: 12/02/20 Time [place]: 0923 Representative spoke with at [place]: [place] ([place]) Interventions     Readmission Risk Interventions Readmission Risk Prevention Plan 12/02/2020  Transportation Screening Complete  PCP or Specialist appointment within 3-5 days of discharge Not Complete  PCP/Specialist Appt Not Complete comments First available appt was already scheduled on 4/7.  [place] or Home Care Consult Complete  SW Recovery Care/Counseling Consult Complete  Palliative Care Screening Not Applicable  Skilled Nursing Facility Not Applicable  Some recent data might be hidden

## 2020-12-03 NOTE — Telephone Encounter (Signed)
Patient requesting to know when she is to take her lovenox injection tonight since being discharged from the hospital today. Patient reports the last time she received lovenox in the hospital was around 10 am. Instructed patient lovenox is to be given every 12 hours . Instructed patient she give herself lovenox injection around 10 pm. Care advise given. Patient verbalized understanding of care advise and when to given herself lovenox and to call back if needed.   Reason for Disposition . Health Information question, no triage required and triager able to answer question  Answer Assessment - Initial Assessment Questions 1. REASON FOR CALL or QUESTION: "What is your reason for calling today?" or "How can I best help you?" or "What question do you have that I can help answer?"     Patient requesting to know when the next time to take her lovenox injection since being discharged from the hospital today.  Protocols used: INFORMATION ONLY CALL - NO TRIAGE-A-AH

## 2020-12-04 ENCOUNTER — Telehealth: Payer: Self-pay | Admitting: Pharmacist

## 2020-12-04 ENCOUNTER — Telehealth: Payer: Self-pay

## 2020-12-04 NOTE — Telephone Encounter (Signed)
Patient returned call. I instructed her to call her Cardiologist office to set up an appointment to have her INR checked next week per recommendations of the hospital team. She verbalized understanding and stated she would call them once she was finished with my call.

## 2020-12-04 NOTE — Telephone Encounter (Signed)
Patient does have her INR checked with the Cardiology clinic. Given her situation, I attempted to call her to make sure she had her follow-up appointment set but she did not answer. I left a VM instructing her to call me back as soon as possible.

## 2020-12-04 NOTE — Telephone Encounter (Signed)
Transition Care Management Unsuccessful Follow-up Telephone Call  Date of discharge and from where:  [facility] on 12/03/2020  Attempts:  1st attempt    Reason for unsuccessful TCM follow-up call: Called pt unable to reach left VM to call back this nurse.   Pt has HFU appt with DR Wynetta Emery on 12/31/2020

## 2020-12-07 ENCOUNTER — Telehealth: Payer: Self-pay

## 2020-12-07 ENCOUNTER — Telehealth: Payer: Self-pay | Admitting: *Deleted

## 2020-12-07 NOTE — Telephone Encounter (Signed)
Transition Care Management Follow-up Telephone Call  Date of discharge and from where: 12/03/2020, [facility]   How have you been since you were released from the hospital? She said she is feeling okay  Any questions or concerns? No   Items Reviewed:  Did the pt receive and understand the discharge instructions provided? Yes   Medications obtained and verified? Yes she said she has all medications and did not have any questions about her med regime,  She stated that she has been administering the lovenox as ordered without any difficulty,.   Other? No   Any new allergies since your discharge? No    Do you have support at home? Yes  - she said her son is living with her.  [place]/[employer] [place] has been working with her to secure permanent housing. An application for voucher based housing program with [employer] has been submitted.    Home Care and Equipment/Supplies: Were home health services ordered? yes If so, what is the name of the agency? [facility]  Has the agency set up a time to come to the patient's home? Yes, a home visit is scheduled for 12/09/2020.  Were any new equipment or medical supplies ordered?  No What is the name of the medical supply agency? n/a Were you able to get the supplies/equipment? not applicable Do you have any questions related to the use of the equipment or supplies? No  Functional Questionnaire: (I = Independent and D = Dependent) ADLs: independent. Has RW and cane to use at home. - could benefit from assistance with managing her medications due to her impaired vision.   Follow up appointments reviewed:   PCP Hospital f/u appt confirmed? Yes  - rescheduled her to see  Dr Wynetta Emery 12/24/2020.    [facility] f/u appt confirmed? Yes - INR check at Coumadin clinic tomorrow - 3/15/202. She missed her appointment today.  Reminded her of the importance of keeping this appointment.  She said she needs to call [facility]  transportation and schedule a ride.  Cardiology appointment - 12/21/2020.   Are transportation arrangements needed? Yes  - she arranges her rides with [employer].   If their condition worsens, is the pt aware to call PCP or go to the Emergency Dept.? Yes  Was the patient provided with contact information for the PCP's office or ED? Yes  Was to pt encouraged to call back with questions or concerns? Yes

## 2020-12-07 NOTE — Telephone Encounter (Signed)
Pt was scheduled to have INR checked today. Pt did not come to appointment. Called and spoke to pt and rescheduled her to come tomorrow to have INR checked on 3/15.

## 2020-12-08 ENCOUNTER — Ambulatory Visit (INDEPENDENT_AMBULATORY_CARE_PROVIDER_SITE_OTHER): Payer: Self-pay | Admitting: *Deleted

## 2020-12-08 ENCOUNTER — Other Ambulatory Visit: Payer: Self-pay

## 2020-12-08 DIAGNOSIS — Z7901 Long term (current) use of anticoagulants: Secondary | ICD-10-CM

## 2020-12-08 LAB — POCT INR: INR: 3.6 — AB (ref 2.0–3.0)

## 2020-12-08 NOTE — Patient Instructions (Addendum)
Description   Stop the Lovenox injections. Today take 2.5mg  (1/2 tablet) then start taking the dose you should be taking, which is, Warfarin 2.5mg  daily except 5mg  on Fridays. Recheck INR in 1 week. Coumadin Clinic # [phone]   Please Bill Code [id number]

## 2020-12-09 ENCOUNTER — Telehealth: Payer: Self-pay | Admitting: Internal Medicine

## 2020-12-09 LAB — VITAMIN B1: Vitamin B1 (Thiamine): 60.9 nmol/L — ABNORMAL LOW (ref 66.5–200.0)

## 2020-12-09 NOTE — Telephone Encounter (Signed)
Copied from [place] [id number]. Topic: General - Inquiry >> Dec 08, 2020  1:49 PM Greggory Keen D wrote: Reason for CRM: Pt called saying when she was at [facility] they gave her Oxycodone and she is needing a refill.  She wants to know if Dr. Wynetta Emery can give her a refill.  CB#[phone]

## 2020-12-09 NOTE — Telephone Encounter (Addendum)
Pt calling back to follow up on this request for oxyCODONE (ROXICODONE) 5 MG immediate release tablet  Pt states this is severe pain, and this is only thing that works.  Pt states she forgot to tell Opal Sidles the other day when they spoke.  But she is out of this medication as well  Zacarias Pontes [facility] [place], [place]

## 2020-12-11 ENCOUNTER — Telehealth: Payer: Self-pay

## 2020-12-11 ENCOUNTER — Other Ambulatory Visit: Payer: Self-pay | Admitting: Family Medicine

## 2020-12-11 MED ORDER — ACETAMINOPHEN-CODEINE 300-30 MG PO TABS: 1.0000 | ORAL_TABLET | Freq: Two times a day (BID) | ORAL | 0 refills | Status: DC | PRN

## 2020-12-11 NOTE — Telephone Encounter (Signed)
Contacted pt to go over lab results pt is aware and doesn't have any questions or concerns 

## 2020-12-11 NOTE — Telephone Encounter (Signed)
Dr. Wynetta Emery is out of the office but I have sent a prescription for Tylenol# 3 to her pharmacy as we do not prescribe oxycodone in the clinic.

## 2020-12-14 NOTE — Telephone Encounter (Signed)
Called pt made aware °

## 2020-12-15 ENCOUNTER — Ambulatory Visit (INDEPENDENT_AMBULATORY_CARE_PROVIDER_SITE_OTHER): Payer: Self-pay | Admitting: *Deleted

## 2020-12-15 ENCOUNTER — Other Ambulatory Visit: Payer: Self-pay

## 2020-12-15 ENCOUNTER — Encounter (INDEPENDENT_AMBULATORY_CARE_PROVIDER_SITE_OTHER): Payer: Self-pay

## 2020-12-15 DIAGNOSIS — Z5181 Encounter for therapeutic drug level monitoring: Secondary | ICD-10-CM

## 2020-12-15 DIAGNOSIS — Z7901 Long term (current) use of anticoagulants: Secondary | ICD-10-CM

## 2020-12-15 LAB — POCT INR: INR: 1.6 — AB (ref 2.0–3.0)

## 2020-12-15 NOTE — Patient Instructions (Signed)
Description   Take warfarin 1 tablet today and tomorrow, then continue to take 1/2 a tablet daily except for 1 tablet on Fridays. Recheck INR in 1 week. Coumadin Clinic. [phone].

## 2020-12-17 ENCOUNTER — Telehealth: Payer: Self-pay | Admitting: Internal Medicine

## 2020-12-17 NOTE — Telephone Encounter (Signed)
Mary, from [facility], called stating that the pt is complaining of 7/10 pain in both legs. She states that the pt states her pain in her Right leg is more significant. She states that the pt is almost out of her pain medication, acetaminophen- codeine, and her Potassium. Pt is requesting to have a refill on both medications. Please advise.   [place], [address]  [place] [address]  Phone: [phone] Fax: [phone]  Hours: Not open 24 hours

## 2020-12-17 NOTE — Telephone Encounter (Signed)
Will forward to provider  

## 2020-12-18 NOTE — Telephone Encounter (Signed)
Pt is returning Mary [name] call

## 2020-12-18 NOTE — Telephone Encounter (Signed)
Contacted pt to go over provider message pt didn't answer lvm

## 2020-12-19 NOTE — Progress Notes (Signed)
Cardiology Office Note:    Date:  12/21/2020   ID:  ANNEKA STUDER, DOB [birth year], MRN [id number]  PCP:  [name], MD   [facility]  Cardiologist:  Candee Furbish, MD   Advanced Practice Provider:  Liliane Shi, PA-C Electrophysiologist:  Cristopher Peru, MD       Referring MD: Antony Blackbird, MD   Chief Complaint:  Hospitalization Follow-up (Admx with worsening anemia)  Patient Profile:    Mary [name] is a 60 y.o. female with:   Severe MR (in setting of SBE) June 2014 ? S/p R mini-thoracotomy with mechanical MVR and TV repair 6/14 ? Post op c/b CHB >> resolved; no pacemaker needed ? Echocardiogram 02/2019: normal EF, normally functioning MVR and TV repair ? Echo 12/2019: EF 55-60, stable MVR, TV repair  Transient Complete Heart Block ? Holter 03/2014: asymptomatic CHB during sleep >> seen by EP Lovena Le); no PPM needed ? Holter 7/19: normal sinus rhythm, intermittent CHB during daytime >> seen by EP; PPM recommended; patient never called back to arrange ? Last seen by Dr. Lovena Le 11/2019: no pacer indicated   Hx of syncope 03/2019; ILR recommended; no insurance coverage  (HFpEF) heart failure with preserved ejection fraction   S/p TIA in 05/2016 (sub therapeutic INR)  Sinus tachycardia ? Beta blocker tried in 5/17 but stopped due to high grade heart block >> seen by EP; no PPM needed  Cardiac Catheterization2014: No CAD  Macrocytic anemia2/2 B12 deficiency  ETOH abuse   Pyloric stenosis, s/p dilation in 11/2020  Neuropathy   Prior CV studies: Echocardiogram 12/31/2019 EF 55-60, no R WMA, normal RV SF, mitral valve replacement without evidence of regurgitation, tricuspid valve repair stable  Holter 03/30/18  Normal sinus rhythm and sinus tachycardia with average heart rate 109 bpm. Heart rate ranged from 27 to 171 bpm.  Intermittent complete heart block with slow ventricular response at 47 bpm during the daytime.  SVT at 171  bpm  Occasional PVCs and wide-complex tachycardia up to 5 beats  Event Monitor 02/24/16 1 episode of high grade heart block at night. Otherwise, mild sinus tachycardia.   Carotid US 9/17 Bilateral: soft plaque throughout CCA, ICA and ECA. 1-39% ICA plaquing. Vertebral artery flow is antegrade.  Cardiac cath 6/14 Left main: No obstructive disease.  Left Anterior Descending Artery: No obstructive disease noted.  Circumflex Artery: No obstructive disease.  Right Coronary Artery: no obstructive disease.  Left Ventricular Angiogram: LVEF 65-70%. 3+ MR Distal Aortogram: No aneurysm. No stenosis.     History of Present Illness:    Ms. Markman was previously followed by Dr. Aundra Dubin. She was transitioned to Dr. Saunders Revel after Dr. Aundra Dubin switched to the [facility]. She has had issues with transient, asymptomatic complete heart block that occurred during sleep since her valve surgery. She was notpreviouslyfelt to require pacemaker implantation. However, in 03/2018, she was felt to be symptomatic and had intermittent CHB during the day. She was seen by EP and PPM was recommended. However, she never called back to schedule this.She was then admitted 03/2019 withsyncope. She was seen byDr. Rich Brave evaluation. He favored ILRimplantation.Her insurance would not cover this. Therefore, she has not undergone ILR implantation.   She was last seen in clinic by Melina Copa, PA-C in 12/21.  She was recently admitted 3/1-3/10 with worsening anemia in the setting of hematemesis.  Hgb dropped to 6.4 and she required transfusion with PRBCs.  She underwent EGD and had dilation performed on her  pyloric stenosis.  She returns for f/u.  She is here alone.  Her appetite is much better.  She has not had any further hematemesis.  She has not had syncope.  She has a lot of issues with tingling and pain in her legs related to neuropathy. Of note, her R leg has been significantly weak since her  hospitalization in 07/2020.  She has f/u with GI and her PCP later this weeks.           Past Medical History:  Diagnosis Date  . Abnormal liver function   . Acute diastolic heart failure ([place]) 03/15/2013  . Alcoholism ([place])   . Anemia   . B12 deficiency   . Carotid stenosis    Carotid US 2/17: bilateral ICA 1-39% >> FU prn  . Childhood asthma   . Chronic diastolic CHF (congestive heart failure) ([place])   . Complete heart block ([place])   . Epigastric hernia 200's  . GERD (gastroesophageal reflux disease)   . History of blood transfusion    "14 w/1st pregnancy; 2 w/last C-section" (03/15/2013)  . Hx of echocardiogram    a. Echo 4/16:  Mild focal basal septal hypertrophy, EF 60-65%, no RWMA, Mechanical MVR ok with mild central regurgitation and no perivalvular leak, small mobile density attached to valvular ring (1.5x1 cm) - post surgical changes vs SBE, mild LAE;   b. Echo 2/17:  Mild post wall LVH, EF 55-60%, no RWMA, mechanical MVR ok (mean 5 mmHg), normal RVSF  . Hypertension    no pcp   will go to [facility]  . Macrocytic anemia   . Protein calorie malnutrition ([place])   . 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 S/P mitral valve replacement with metallic valve 4/58/0998   85mm Sorin Carbomedics mechanical prosthesis via right mini thoracotomy approach  . 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 Severe mitral regurgitation 04/22/2013  . Sinus tachycardia   . SVT (supraventricular tachycardia) ([place]) 03/16/2013  . TIA (transient ischemic attack)   . Tricuspid regurgitation     Current Medications: Current Meds  Medication Sig  . acetaminophen (TYLENOL) 500 MG tablet Take 1 tablet (500 mg total) by mouth every 6 (six) hours as needed for mild pain or headache.  . Acetaminophen-Codeine (TYLENOL/CODEINE  #3) 300-30 MG tablet Take 1 tablet by mouth every 12 (twelve) hours as needed for pain.  . Cyanocobalamin (VITAMIN B-12) 500 MCG TABS Take 500 mcg by mouth daily.  . folic acid (FOLVITE) 1 MG tablet Take 1 tablet (1 mg total) by mouth daily.  [name] gabapentin (NEURONTIN) 100 MG capsule Take 1 capsule (100 mg total) by mouth 2 (two) times daily. And continue 300 mg at bedtime  . gabapentin (NEURONTIN) 300 MG capsule Take 1 capsule (300 mg total) by mouth at bedtime.  . Multiple Vitamin (MULTIVITAMIN WITH MINERALS) TABS tablet Take 1 tablet by mouth daily.  . pantoprazole (PROTONIX) 40 MG tablet Take 1 tablet (40 mg total) by mouth 2 (two) times daily.  [name] thiamine 100 MG tablet Take 1 tablet (100 mg total) by mouth daily.  [name] warfarin (COUMADIN) 5 MG tablet Take 2.5mg  to 5mg  daily or as directed by Coumadin clinic  . [DISCONTINUED] oxyCODONE (ROXICODONE) 5 MG immediate release tablet Take 1 tablet (5 mg total) by mouth every 6 (six) hours as needed for severe pain.  Allergies:   Oxycodone and Tape   Social History   Tobacco Use  . Smoking status: Former Smoker    Packs/day: 0.50    Years: 36.00    Pack years: 18.00    Types: Cigarettes    Quit date: 03/15/2013    Years since quitting: 7.7  . Smokeless tobacco: Never Used  Vaping Use  . Vaping Use: Never used  Substance Use Topics  . Alcohol use: Yes    Comment:  "glass of wine"  . Drug use: No     Family Hx: The patient's family history includes Cancer in her father; Sarcoidosis in her sister; Stroke in her father. There is no history of Heart attack, Colon cancer, Stomach cancer, Pancreatic cancer, or Esophageal cancer.  ROS   EKGs/Labs/Other Test Reviewed:    EKG:  EKG is not ordered today.  The ekg ordered today demonstrates n/a  Recent Labs: 08/18/2020: B Natriuretic Peptide 95.8 08/28/2020: TSH 1.660 11/28/2020: ALT 38 12/03/2020: BUN 5; Creatinine, Ser 0.83; Hemoglobin 7.8; Magnesium 1.4; Platelets 223; Potassium 4.0;  Sodium 138   Recent Lipid Panel Lab Results  Component Value Date/Time   CHOL 189 05/31/2018 11:08 AM   TRIG 300 (H) 05/31/2018 11:08 AM   HDL 68 05/31/2018 11:08 AM   CHOLHDL 2.8 05/31/2018 11:08 AM   CHOLHDL 3.2 06/06/2016 01:35 AM   LDLCALC 61 05/31/2018 11:08 AM      Risk Assessment/Calculations:      Physical Exam:    VS:  BP 140/78   Pulse 99   Ht 5\' 2"  (1.575 m)   Wt 119 lb (54 kg)   LMP 03/22/2013   SpO2 97%   BMI 21.77 kg/m     Wt Readings from Last 3 Encounters:  12/21/20 119 lb (54 kg)  12/03/20 122 lb 12.7 oz (55.7 kg)  09/01/20 120 lb 6.4 oz (54.6 kg)     Constitutional:      Appearance: Healthy appearance. Not in distress.  Pulmonary:     Effort: Pulmonary effort is normal.     Breath sounds: No wheezing. No rales.  Cardiovascular:     Normal rate. Regular rhythm. S1. Mechanical S1 Normal S2.     Murmurs: There is no murmur.  Edema:    Pretibial: bilateral trace edema of the pretibial area. Abdominal:     Palpations: Abdomen is soft.  Musculoskeletal:     Cervical back: Neck supple. Skin:    General: Skin is warm and dry.  Neurological:     Mental Status: Alert and oriented to person, place and time.     Cranial Nerves: Cranial nerves are intact.          ASSESSMENT & PLAN:    1. S/P mitral valve replacement with metallic valve 2. S/P minimally-invasive tricuspid valve repair Stable on most recent echocardiogram in 4/21.  Continue SBE prophylaxis.  Continue Warfarin.  She has f/u in the Coumadin Clinic today.   3. Sinus tachycardia 4. History of complete heart block HR fairly well controlled.  Her HR is likely driven by anemia.  She cannot take AVN blocking agents due to hx of intermittent HB.    5. Anemia, unspecified type She has f/u with GI and primary care later this week.  She has not had any further bleeding issues.    6. Chronic heart failure with preserved ejection fraction (HCC) Volume status appears to be stable.  BP is a  little borderline today. Will continue to monitor for now.  No medication changes at this time.      Dispo:  No follow-ups on file.   Medication Adjustments/Labs and Tests Ordered: Current medicines are reviewed at length with the patient today.  Concerns regarding medicines are outlined above.  Tests Ordered: No orders of the defined types were placed in this encounter.  Medication Changes: No orders of the defined types were placed in this encounter.   Signed, Richardson Dopp, PA-C  12/21/2020 9:20 AM    [facility] [place], [place],   [address] Phone: [phone]; Fax: [phone]

## 2020-12-21 ENCOUNTER — Ambulatory Visit (INDEPENDENT_AMBULATORY_CARE_PROVIDER_SITE_OTHER): Payer: Self-pay | Admitting: Physician Assistant

## 2020-12-21 ENCOUNTER — Other Ambulatory Visit: Payer: Self-pay

## 2020-12-21 ENCOUNTER — Encounter (INDEPENDENT_AMBULATORY_CARE_PROVIDER_SITE_OTHER): Payer: Self-pay

## 2020-12-21 ENCOUNTER — Ambulatory Visit (INDEPENDENT_AMBULATORY_CARE_PROVIDER_SITE_OTHER): Payer: Self-pay | Admitting: Pharmacist

## 2020-12-21 ENCOUNTER — Encounter: Payer: Self-pay | Admitting: Physician Assistant

## 2020-12-21 VITALS — BP 140/78 | HR 99 | Ht 62.0 in | Wt 119.0 lb

## 2020-12-21 DIAGNOSIS — Z9889 Other specified postprocedural states: Secondary | ICD-10-CM

## 2020-12-21 DIAGNOSIS — Z8679 Personal history of other diseases of the circulatory system: Secondary | ICD-10-CM

## 2020-12-21 DIAGNOSIS — R000 Tachycardia, unspecified: Secondary | ICD-10-CM

## 2020-12-21 DIAGNOSIS — Z954 Presence of other heart-valve replacement: Secondary | ICD-10-CM

## 2020-12-21 DIAGNOSIS — D649 Anemia, unspecified: Secondary | ICD-10-CM

## 2020-12-21 DIAGNOSIS — I5032 Chronic diastolic (congestive) heart failure: Secondary | ICD-10-CM

## 2020-12-21 DIAGNOSIS — Z7901 Long term (current) use of anticoagulants: Secondary | ICD-10-CM

## 2020-12-21 LAB — POCT INR: INR: 2 (ref 2.0–3.0)

## 2020-12-21 NOTE — Telephone Encounter (Signed)
Medical Assistant left message on patient's home and cell voicemail. Voicemail states to give a call back to Rodgers Likes with [facility] at [phone].  

## 2020-12-21 NOTE — Patient Instructions (Signed)
Take warfarin 1 tablet today and tomorrow, then start taking 1/2 a tablet daily except for 1 tablet on Mondays and Fridays. Recheck INR in 1 week. Coumadin Clinic. [phone].

## 2020-12-21 NOTE — Patient Instructions (Addendum)
Medication Instructions:  Your physician recommends that you continue on your current medications as directed. Please refer to the Current Medication list given to you today.  *If you need a refill on your cardiac medications before your next appointment, please call your pharmacy*   Lab Work: None ordered  If you have labs (blood work) drawn today and your tests are completely normal, you will receive your results only by: [name] MyChart Message (if you have MyChart) OR . A paper copy in the mail If you have any lab test that is abnormal or we need to change your treatment, we will call you to review the results.   Testing/Procedures: None ordered   Follow-Up: At [facility], you and your health needs are our priority.  As part of our continuing mission to provide you with exceptional heart care, we have created designated Provider Care Teams.  These Care Teams include your primary Cardiologist (physician) and Advanced Practice Providers (APPs -  Physician Assistants and Nurse Practitioners) who all work together to provide you with the care you need, when you need it.  We recommend signing up for the patient portal called "MyChart".  Sign up information is provided on this After Visit Summary.  MyChart is used to connect with patients for Virtual Visits (Telemedicine).  Patients are able to view lab/test results, encounter notes, upcoming appointments, etc.  Non-urgent messages can be sent to your provider as well.   To learn more about what you can do with MyChart, go to [email or web address].    Your next appointment:   6 month(s)  The format for your next appointment:   In Person  Provider:   You may see Candee Furbish, MD or one of the following Advanced Practice Providers on your designated Care Team:    Kathyrn Drown, NP    Other Instructions

## 2020-12-22 ENCOUNTER — Ambulatory Visit: Payer: Medicaid Other | Admitting: Gastroenterology

## 2020-12-24 ENCOUNTER — Ambulatory Visit: Payer: Self-pay | Attending: Internal Medicine | Admitting: Internal Medicine

## 2020-12-24 ENCOUNTER — Other Ambulatory Visit: Payer: Self-pay | Admitting: Internal Medicine

## 2020-12-24 ENCOUNTER — Other Ambulatory Visit: Payer: Self-pay

## 2020-12-24 ENCOUNTER — Telehealth: Payer: Self-pay | Admitting: Internal Medicine

## 2020-12-24 ENCOUNTER — Encounter: Payer: Self-pay | Admitting: Internal Medicine

## 2020-12-24 VITALS — BP 157/83 | HR 90 | Resp 16

## 2020-12-24 DIAGNOSIS — K068 Other specified disorders of gingiva and edentulous alveolar ridge: Secondary | ICD-10-CM

## 2020-12-24 DIAGNOSIS — R269 Unspecified abnormalities of gait and mobility: Secondary | ICD-10-CM

## 2020-12-24 DIAGNOSIS — R945 Abnormal results of liver function studies: Secondary | ICD-10-CM

## 2020-12-24 DIAGNOSIS — D539 Nutritional anemia, unspecified: Secondary | ICD-10-CM

## 2020-12-24 DIAGNOSIS — K920 Hematemesis: Secondary | ICD-10-CM

## 2020-12-24 DIAGNOSIS — F1290 Cannabis use, unspecified, uncomplicated: Secondary | ICD-10-CM

## 2020-12-24 DIAGNOSIS — G621 Alcoholic polyneuropathy: Secondary | ICD-10-CM

## 2020-12-24 DIAGNOSIS — R600 Localized edema: Secondary | ICD-10-CM

## 2020-12-24 DIAGNOSIS — R7989 Other specified abnormal findings of blood chemistry: Secondary | ICD-10-CM

## 2020-12-24 MED ORDER — TRAMADOL HCL 50 MG PO TABS: 50.0000 mg | ORAL_TABLET | Freq: Two times a day (BID) | ORAL | 0 refills | Status: DC | PRN

## 2020-12-24 MED ORDER — MAGIC MOUTHWASH: 5.0000 mL | Freq: Three times a day (TID) | ORAL | 0 refills | Status: DC | PRN

## 2020-12-24 MED ORDER — PANTOPRAZOLE SODIUM 40 MG PO TBEC: 40.0000 mg | DELAYED_RELEASE_TABLET | Freq: Two times a day (BID) | ORAL | 6 refills | Status: DC

## 2020-12-24 MED ORDER — POTASSIUM CHLORIDE ER 10 MEQ PO TBCR: 10.0000 meq | EXTENDED_RELEASE_TABLET | Freq: Every day | ORAL | 1 refills | Status: DC

## 2020-12-24 MED ORDER — VITAMIN B12 500 MCG PO TABS: 500.0000 ug | ORAL_TABLET | Freq: Every day | ORAL | 2 refills | Status: DC

## 2020-12-24 MED ORDER — FUROSEMIDE 20 MG PO TABS: 20.0000 mg | ORAL_TABLET | Freq: Every day | ORAL | 3 refills | Status: DC

## 2020-12-24 MED ORDER — THIAMINE HCL 100 MG PO TABS: 100.0000 mg | ORAL_TABLET | Freq: Every day | ORAL | 1 refills | Status: DC

## 2020-12-24 MED ORDER — GABAPENTIN 300 MG PO CAPS: 300.0000 mg | ORAL_CAPSULE | Freq: Every day | ORAL | 2 refills | Status: DC

## 2020-12-24 NOTE — Progress Notes (Signed)
Patient ID: Mary [name], female    DOB: 05/14/1961  MRN: [id number]  CC: Hospitalization Follow-up   Subjective: Mary [name] is a 60 y.o. 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 Work-up: CAT scan head negative for acute findings. Elevated AST and ALT 214/99 suggesting alcohol induced pattern.  Calcium level 5.4. Ultrasound of the liver-fatty liver changes INR 8.2.  She received vitamin K Hemoglobin dropped from 9.9-6.4.  Transfused 1 unit of packed red blood cell EGD showed pyloric stenosis which was dilated.  No other source of bleeding noted.  Restarted on Coumadin.  Continue Protonix twice daily. Hospital course complicated by right hip pain.  CT of the hip showed joint effusion.  IR attempted arthrocentesis would was able to be removed.  Orthopedics evaluated and recommended physical therapy and possible outpatient neurology evaluation AST and ALT declined to normal range.  Patient denied EtOH use.  Urine drug screen positive for marijuana.  She was discharged home on a limited supply of oxycodone 5 mg every 6 hours as needed.  Today: She has not had any further hematemesis.  She has vomited once since hospital discharge.  No blood in the stools or dark stools.  She continues to take Coumadin and is followed by anticoagulation clinic.  Most recent INR was 2.   History of EtOH use disorder: Reports that she is not drank alcohol since November of last year.  LFTs had trended down to normal range by the time of hospital discharge.  We plan to recheck levels today.  Complains of  weakness , ain and numbness in the lower extremities and in her hands and around her lips.  This has been chronic since the fall of last year.  Symptoms thought to be due to alcohol induced neuropathy.  She is not sure that gabapentin really helps other than to help with sleep but she has been taking it 300 mg at bedtime and 100 mg twice a day during the day.  She requests refill.  She is also requesting a refill on Tylenol with Codeine.  She felt that that helped even more.  She was given a short course of this in my absence a week or 2 ago by Dr. Margarita Rana when she was discharged from the hospital and had ran out of the oxycodone. -She admits to marijuana use intermittently and states that she has used it for a long time.  She denies any other street drug use. -Never did get into see the neurologist.  They tried contacting her several times but was unable to reach her via phone.  She was referred to them on discharge from this recent hospitalization and again they have not been able to reach her -Receiving home physical therapy once a week.  She has had 2 sessions so far.  She is requesting refill on furosemide and potassium which her cardiologist had given her in the past to use as needed for swelling in the legs.  She reports swelling in both legs for the past 2 weeks.  She endorses shortness of breath which is chronic and continues to sleep on 4 pillows at nights.  Saw neurology PA 2 days  ago but forgot to mention this to him.  Requesting refill on Magic mouthwash.  For painful gums.  She has not yet gotten in with a dentist since I last saw her.    Patient Active Problem List   Diagnosis Date Noted  . LFT elevation   . Acquired pyloric stenosis   . GI bleed 11/24/2020  . Alcoholic peripheral neuropathy ([place]) 09/01/2020  . Thiamin deficiency 09/01/2020  . Paresthesia   . Vitamin B12 deficiency 05/04/2020  . Vomiting 04/24/2020  . GERD (gastroesophageal reflux disease)   . Marijuana abuse   .  Macrocytic anemia 04/04/2019  . Intermittent complete heart block ([place])   . Syncope 03/28/2019  . Orthostatic hypotension 01/11/2017  . Elevated liver enzymes 01/11/2017  . Atypical chest pain 06/05/2016  . Elevated INR 01/10/2016  . TIA (transient ischemic attack) 09/13/2015  . CVA (cerebral vascular accident) ([place]) 09/12/2015  . SOB (shortness of breath) 02/24/2014  . First degree heart block 06/17/2013  . (HFpEF) heart failure with preserved ejection fraction ([place]) 06/16/2013  . History of bacterial endocarditis 06/16/2013  . Warfarin anticoagulation 06/10/2013  . S/P mitral valve replacement with metallic valve 78/46/9629  . Femoral hernia 05/23/2013  . Mitral valve insufficiency 04/22/2013  . Tricuspid regurgitation 04/22/2013  . 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 SVT (supraventricular tachycardia) ([place]) 03/16/2013     Current Outpatient Medications on File Prior to Visit  Medication Sig Dispense Refill  . acetaminophen (TYLENOL) 500 MG tablet Take 1 tablet (500 mg total) by mouth every 6 (six) hours as needed for mild pain or headache.  0  . folic acid (FOLVITE) 1 MG tablet Take 1 tablet (1 mg total) by mouth daily. 30 tablet 2  . gabapentin (NEURONTIN) 100 MG capsule Take 1 capsule (100 mg total) by mouth 2 (two) times daily. And continue 300 mg at bedtime 180 capsule 3  . Multiple Vitamin (MULTIVITAMIN WITH MINERALS) TABS tablet Take 1 tablet by mouth daily.    [name] warfarin (COUMADIN) 5 MG tablet Take 2.5mg  to 5mg  daily or as directed by Coumadin clinic 25 tablet 0   No current facility-administered medications on file prior to visit.    Allergies  Allergen Reactions  . Oxycodone Itching  . Tape Rash and Other (See Comments)    THE ONLY TAPE THAT IS TOLERATED IS PAPER TAPE. EKG leads inflame the skin    Social History   Socioeconomic History  . Marital status: Single    Spouse name: Not on file  . Number of children: Not on file  . Years of  education: Not on file  . Highest education level: Not on file  Occupational History  . Not on file  Tobacco Use  . Smoking status: Former Smoker    Packs/day: 0.50    Years: 36.00    Pack years: 18.00    Types: Cigarettes    Quit date: 03/15/2013    Years since quitting: 7.7  . Smokeless tobacco: Never Used  Vaping Use  . Vaping Use: Never used  Substance and Sexual Activity  . Alcohol use: Yes    Comment:  "glass of wine"  . Drug use: No  . Sexual activity: Never  Other Topics Concern  . Not on file  Social History Narrative  . Not on file   Social Determinants of Health   Financial Resource Strain: Not on file  Food Insecurity: Not on file  Transportation Needs: Not on file  Physical Activity: Not on  file  Stress: Not on file  Social Connections: Not on file  Intimate Partner Violence: Not on file    Family History  Problem Relation Age of Onset  . Stroke Father   . Cancer Father   . Sarcoidosis Sister   . Heart attack Neg Hx   . Colon cancer Neg Hx   . Stomach cancer Neg Hx   . Pancreatic cancer Neg Hx   . Esophageal cancer Neg Hx     Past Surgical History:  Procedure Laterality Date  . APPENDECTOMY  [id number]  . BALLOON DILATION N/A 11/27/2020   Procedure: BALLOON DILATION;  Surgeon: Gatha Mayer, MD;  Location: [facility] ENDOSCOPY;  Service: Endoscopy;  Laterality: N/A;  . BIOPSY  05/01/2020   Procedure: BIOPSY;  Surgeon: Milus Banister, MD;  Location: [place];  Service: Endoscopy;;  . CARDIAC CATHETERIZATION    . CARDIAC VALVE REPLACEMENT    . CESAREAN SECTION  1983  . CESAREAN SECTION WITH BILATERAL TUBAL LIGATION  1999  . COLONOSCOPY WITH PROPOFOL N/A 05/01/2020   Procedure: COLONOSCOPY WITH PROPOFOL;  Surgeon: Milus Banister, MD;  Location: [facility] ENDOSCOPY;  Service: Endoscopy;  Laterality: N/A;  . ESOPHAGOGASTRODUODENOSCOPY (EGD) WITH PROPOFOL N/A 05/01/2020   Procedure: ESOPHAGOGASTRODUODENOSCOPY (EGD) WITH PROPOFOL;  Surgeon: Milus Banister, MD;   Location: [facility] ENDOSCOPY;  Service: Endoscopy;  Laterality: N/A;  . ESOPHAGOGASTRODUODENOSCOPY (EGD) WITH PROPOFOL N/A 11/27/2020   Procedure: ESOPHAGOGASTRODUODENOSCOPY (EGD) WITH PROPOFOL;  Surgeon: Gatha Mayer, MD;  Location: [place];  Service: Endoscopy;  Laterality: N/A;  . FEMORAL HERNIA REPAIR Right 05/23/2013   Procedure: HERNIA REPAIR FEMORAL;  Surgeon: Rexene Alberts, MD;  Location: [place];  Service: Open Heart Surgery;  Laterality: Right;  . INTRAOPERATIVE TRANSESOPHAGEAL ECHOCARDIOGRAM N/A 05/23/2013   Procedure: INTRAOPERATIVE TRANSESOPHAGEAL ECHOCARDIOGRAM;  Surgeon: Rexene Alberts, MD;  Location: [place];  Service: Open Heart Surgery;  Laterality: N/A;  . LAPAROSCOPIC CHOLECYSTECTOMY  2003  . LEFT AND RIGHT HEART CATHETERIZATION WITH CORONARY ANGIOGRAM N/A 03/22/2013   Procedure: LEFT AND RIGHT HEART CATHETERIZATION WITH CORONARY ANGIOGRAM;  Surgeon: Burnell Blanks, MD;  Location[facility] C[facility] LAB;  Service: Cardiovascular;  Laterality: N/A;  . MINIMALLY INVASIVE TRICUSPID VALVE REPAIR Right 05/23/2013   Procedure: MINIMALLY INVASIVE TRICUSPID VALVE REPAIR;  Surgeon: Rexene Alberts, MD;  Location: [place];  Service: Open Heart Surgery;  Laterality: Right;  . MITRAL VALVE REPLACEMENT N/A 05/23/2013   Procedure: MITRAL VALVE (MV) REPLACEMENT;  Surgeon: Rexene Alberts, MD;  Location: [place];  Service: Open Heart Surgery;  Laterality: N/A;  . MULTIPLE EXTRACTIONS WITH ALVEOLOPLASTY N/A 04/04/2013   Procedure: Extraction of tooth #'s 1,8,9,13,14,15,23,24,25,26 with alveoloplasty and gross debridement of remaining teeth;  Surgeon: Lenn Cal, DDS;  Location: [place];  Service: Oral Surgery;  Laterality: N/A;  . TEE WITHOUT CARDIOVERSION N/A 03/18/2013   Procedure: TRANSESOPHAGEAL ECHOCARDIOGRAM (TEE);  Surgeon: Larey Dresser, MD;  Location: [place];  Service: Cardiovascular;  Laterality: N/A;  . TEE WITHOUT CARDIOVERSION N/A 06/17/2013   Procedure: TRANSESOPHAGEAL ECHOCARDIOGRAM  (TEE);  Surgeon: Lelon Perla, MD;  Location: [facility] ENDOSCOPY;  Service: Cardiovascular;  Laterality: N/A;  . TUBAL LIGATION  1999    ROS: Review of Systems Negative except as stated above  PHYSICAL EXAM: BP (!) 157/83   Pulse 90   Resp 16   LMP 03/22/2013   SpO2 98%   Physical Exam  General appearance - alert, well appearing, middle-aged African-American female and in no distress.  She  is sitting in the recliner chair in our procedure room. Mental status - normal mood, behavior, speech, dress, motor activity, and thought processes Chest - clear to auscultation, no wheezes, rales or rhonchi, symmetric air entry Heart -regular rate and rhythm.  Clicks from her valve are clearly heard. Extremities -1+ bilateral lower extremity edema.  CMP Latest Ref Rng & Units 12/03/2020 11/30/2020 11/29/2020  Glucose 70 - 99 mg/dL 89 84 75  BUN 6 - 20 mg/dL 5(L) <5(L) 5(L)  Creatinine 0.44 - 1.00 mg/dL 0.83 0.93 0.78  Sodium 135 - 145 mmol/L 138 139 139  Potassium 3.5 - 5.1 mmol/L 4.0 4.3 4.6  Chloride 98 - 111 mmol/L 106 111 113(H)  CO2 22 - 32 mmol/L 27 19(L) 18(L)  Calcium 8.9 - 10.3 mg/dL 8.6(L) 8.4(L) 8.2(L)  Total Protein 6.5 - 8.1 g/dL - - -  Total Bilirubin 0.3 - 1.2 mg/dL - - -  Alkaline Phos 38 - 126 U/L - - -  AST 15 - 41 U/L - - -  ALT 0 - 44 U/L - - -   Lipid Panel     Component Value Date/Time   CHOL 189 05/31/2018 1108   TRIG 300 (H) 05/31/2018 1108   HDL 68 05/31/2018 1108   CHOLHDL 2.8 05/31/2018 1108   CHOLHDL 3.2 06/06/2016 0135   VLDL 22 06/06/2016 0135   LDLCALC 61 05/31/2018 1108    CBC    Component Value Date/Time   WBC 5.0 12/03/2020 0620   RBC 2.05 (L) 12/03/2020 0620   HGB 7.8 (L) 12/03/2020 0620   HGB 9.8 (L) 08/28/2020 1250   HCT 22.7 (L) 12/03/2020 0620   HCT 27.2 (L) 08/28/2020 1250   PLT 223 12/03/2020 0620   PLT 434 08/28/2020 1250   MCV 110.7 (H) 12/03/2020 0620   MCV 110 (H) 08/28/2020 1250   MCH 38.0 (H) 12/03/2020 0620   MCHC 34.4  12/03/2020 0620   RDW 22.2 (H) 12/03/2020 0620   RDW 19.6 (H) 08/28/2020 1250   LYMPHSABS 1.2 12/03/2020 0620   LYMPHSABS 1.9 06/26/2020 1218   MONOABS 0.7 12/03/2020 0620   EOSABS 0.2 12/03/2020 0620   EOSABS 0.2 06/26/2020 1218   BASOSABS 0.0 12/03/2020 0620   BASOSABS 0.0 06/26/2020 1218    ASSESSMENT AND PLAN: 1. Hematemesis of unknown cause Continue pantoprazole. Recheck CBC today. She has been referred to gastroenterology for follow-up - pantoprazole (PROTONIX) 40 MG tablet; Take 1 tablet (40 mg total) by mouth 2 (two) times daily.  Dispense: 30 tablet; Refill: 6  2. Macrocytic anemia She did not have bottles of vitamin B12 or thiamine with her today.  She needs to continue taking because she does have deficiencies of these vitamins. - CBC - Iron, TIBC and Ferritin Panel - Cyanocobalamin (VITAMIN B12) 500 MCG TABS; Take 500 mcg by mouth daily.  Dispense: 100 tablet; Refill: 2 - thiamine 100 MG tablet; Take 1 tablet (100 mg total) by mouth daily.  Dispense: 100 tablet; Refill: 1  3. Alcoholic peripheral neuropathy ([place]) Stressed the importance of taking B12 and thiamine supplements. Stressed the importance of total abstinence from alcohol. Patient reports that she continues to abstain from alcohol and has done so since the fall of last year. -I would like to get her in with the neurologist for EMG and further evaluation.  Encouraged to answer her phone or to reply to voicemail messages -Continue gabapentin.  I declined refill on Tylenol with codeine.  We discussed putting her on tramadol twice a  day to use with the gabapentin to see if it would help to decrease symptoms enough to allow her to be more functional.  she does have increased risks for being placed on narcotics long-term given early remission from severe ETOH abuse and current marijuana use.  However,we agreed for trial of several mths to see if it helps.  Went over possible S.E of med including potential for  dependence, addiction.  Went over the possible side effects including constipation.  Advised patient to discontinue use of marijuana.  Continued use may result in as having to discontinue the tramadol. [place] controlled substance reporting system reviewed.  My CMA went over the controlled substance prescribing agreement with her and patient was agreeable to this.  I did not repeat urine drug screen as she had one was done on recent hospital.  - gabapentin (NEURONTIN) 300 MG capsule; Take 1 capsule (300 mg total) by mouth at bedtime.  Dispense: 30 capsule; Refill: 2 - traMADol (ULTRAM) 50 MG tablet; Take 1 tablet (50 mg total) by mouth every 12 (twelve) hours as needed for up to 5 days.  Dispense: 60 tablet; Refill: 0 - Ambulatory referral to Neurology  4. Gait disturbance Alcohol-induced polyneuropathy playing a role. - Ambulatory referral to Neurology  5. Abnormal LFTs I challenged patient that the AST and ALT elevations seen during recent hospitalization is in a pattern consistent with ongoing alcohol use.  However patient is adamant that she has not drank in several months. - Comprehensive metabolic panel  6. Pain in gums - magic mouthwash SOLN; Take 5 mLs by mouth 3 (three) times daily as needed for mouth pain.  Dispense: 100 mL; Refill: 0  7. Edema of both legs - furosemide (LASIX) 20 MG tablet; Take 1 tablet (20 mg total) by mouth daily.  Dispense: 30 tablet; Refill: 3 - potassium chloride (KLOR-CON) 10 MEQ tablet; Take 1 tablet (10 mEq total) by mouth daily. To take with fluid pill  Dispense: 30 tablet; Refill: 1  8. Marijuana user See #3 above     Patient was given the opportunity to ask questions.  Patient verbalized understanding of the plan and was able to repeat key elements of the plan.   Orders Placed This Encounter  Procedures  . CBC  . Comprehensive metabolic panel  . Iron, TIBC and Ferritin Panel  . Ambulatory referral to Neurology     Requested  Prescriptions   Signed Prescriptions Disp Refills  . gabapentin (NEURONTIN) 300 MG capsule 30 capsule 2    Sig: Take 1 capsule (300 mg total) by mouth at bedtime.  . furosemide (LASIX) 20 MG tablet 30 tablet 3    Sig: Take 1 tablet (20 mg total) by mouth daily.  . potassium chloride (KLOR-CON) 10 MEQ tablet 30 tablet 1    Sig: Take 1 tablet (10 mEq total) by mouth daily. To take with fluid pill  . magic mouthwash SOLN 100 mL 0    Sig: Take 5 mLs by mouth 3 (three) times daily as needed for mouth pain.  . traMADol (ULTRAM) 50 MG tablet 60 tablet 0    Sig: Take 1 tablet (50 mg total) by mouth every 12 (twelve) hours as needed for up to 5 days.  . pantoprazole (PROTONIX) 40 MG tablet 30 tablet 6    Sig: Take 1 tablet (40 mg total) by mouth 2 (two) times daily.  . Cyanocobalamin (VITAMIN B12) 500 MCG TABS 100 tablet 2    Sig: Take 500 mcg by  mouth daily.  [name] thiamine 100 MG tablet 100 tablet 1    Sig: Take 1 tablet (100 mg total) by mouth daily.    Return in about 2 months (around 02/23/2021).  Karle Plumber, MD, FACP

## 2020-12-24 NOTE — Telephone Encounter (Signed)
Pt called regarding her prescription for the Magic Mouth Wash Solution. She states that she went to the pharmacy and that they state it was not on file. Medication is showing as print. Contacted office and spoke with Greggory Stallion who stated that the medication was at the [facility] and that pt could pick up there. Attempted to communicate this with pt and got disconnected. Attempted to call pt back with no answer. LVMOM asking pt to contact office with any further questions. If pt calls back please advise.

## 2020-12-25 ENCOUNTER — Telehealth: Payer: Self-pay | Admitting: Internal Medicine

## 2020-12-25 LAB — CBC
Hematocrit: 29 % — ABNORMAL LOW (ref 34.0–46.6)
Hemoglobin: 9.6 g/dL — ABNORMAL LOW (ref 11.1–15.9)
MCH: 35.8 pg — ABNORMAL HIGH (ref 26.6–33.0)
MCHC: 33.1 g/dL (ref 31.5–35.7)
MCV: 108 fL — ABNORMAL HIGH (ref 79–97)
Platelets: 361 10*3/uL (ref 150–450)
RBC: 2.68 x10E6/uL — CL (ref 3.77–5.28)
RDW: 15.7 % — ABNORMAL HIGH (ref 11.7–15.4)
WBC: 7.6 10*3/uL (ref 3.4–10.8)

## 2020-12-25 LAB — COMPREHENSIVE METABOLIC PANEL
ALT: 16 IU/L (ref 0–32)
AST: 43 IU/L — ABNORMAL HIGH (ref 0–40)
Albumin/Globulin Ratio: 0.9 — ABNORMAL LOW (ref 1.2–2.2)
Albumin: 2.7 g/dL — ABNORMAL LOW (ref 3.8–4.9)
Alkaline Phosphatase: 127 IU/L — ABNORMAL HIGH (ref 44–121)
BUN/Creatinine Ratio: 6 — ABNORMAL LOW (ref 9–23)
BUN: 4 mg/dL — ABNORMAL LOW (ref 6–24)
Bilirubin Total: 0.3 mg/dL (ref 0.0–1.2)
CO2: 21 mmol/L (ref 20–29)
Calcium: 8.6 mg/dL — ABNORMAL LOW (ref 8.7–10.2)
Chloride: 106 mmol/L (ref 96–106)
Creatinine, Ser: 0.72 mg/dL (ref 0.57–1.00)
Globulin, Total: 3.1 g/dL (ref 1.5–4.5)
Glucose: 80 mg/dL (ref 65–99)
Potassium: 3.9 mmol/L (ref 3.5–5.2)
Sodium: 141 mmol/L (ref 134–144)
Total Protein: 5.8 g/dL — ABNORMAL LOW (ref 6.0–8.5)
eGFR: 97 mL/min/{1.73_m2} (ref >59)

## 2020-12-25 LAB — IRON,TIBC AND FERRITIN PANEL
Ferritin: 1290 ng/mL — ABNORMAL HIGH (ref 15–150)
Iron Saturation: 35 % (ref 15–55)
Iron: 54 ug/dL (ref 27–159)
Total Iron Binding Capacity: 154 ug/dL — ABNORMAL LOW (ref 250–450)
UIBC: 100 ug/dL — ABNORMAL LOW (ref 131–425)

## 2020-12-25 NOTE — Telephone Encounter (Signed)
I have already spoke with pt this morning in regards to rx. Made pt aware that we do not fill rxs for magic mouth was because it's a compound. Pt requested that rx be sent to [facility]. Rx was fax this morning to pt preferred pharmacy.

## 2020-12-25 NOTE — Progress Notes (Signed)
Let patient know that she is still anemic but her hemoglobin has improved compared to when she was discharged from the hospital.  Hemoglobin is currently 9.6.  Slight elevation in her liver enzyme.  Continue to strain from alcohol use.  Kidney function is good.

## 2020-12-25 NOTE — Telephone Encounter (Signed)
Someone in our pharmacy needs to speak with her. She can call our pharmacy at [phone].

## 2020-12-25 NOTE — Telephone Encounter (Signed)
Copied from [place] [id number]. Topic: General - Other >> Dec 24, 2020 12:16 PM Tessa Lerner A wrote: Reason for CRM: Patient would like to be contacted by a member of Pharmacy staff when possible   Patient would like to be contacted regarding the exact price of their prescriptions  Patient is attempting to have son pick up medications for them but needs to know how much money it will cost  Please contact to further advise when possible

## 2020-12-26 ENCOUNTER — Other Ambulatory Visit: Payer: Self-pay

## 2020-12-26 MED FILL — Tramadol HCl Tab 50 MG: ORAL | 30 days supply | Qty: 60 | Fill #0 | Status: AC

## 2020-12-27 ENCOUNTER — Other Ambulatory Visit: Payer: Self-pay

## 2020-12-27 MED FILL — Furosemide Tab 20 MG: ORAL | 30 days supply | Qty: 30 | Fill #0 | Status: AC

## 2020-12-27 MED FILL — Potassium Chloride Tab ER 10 mEq: ORAL | 30 days supply | Qty: 30 | Fill #0 | Status: AC

## 2020-12-27 MED FILL — Gabapentin Cap 300 MG: ORAL | 30 days supply | Qty: 30 | Fill #0 | Status: AC

## 2020-12-28 ENCOUNTER — Other Ambulatory Visit: Payer: Self-pay

## 2020-12-28 ENCOUNTER — Ambulatory Visit (INDEPENDENT_AMBULATORY_CARE_PROVIDER_SITE_OTHER): Payer: Self-pay

## 2020-12-28 DIAGNOSIS — Z7901 Long term (current) use of anticoagulants: Secondary | ICD-10-CM

## 2020-12-28 DIAGNOSIS — I5031 Acute diastolic (congestive) heart failure: Secondary | ICD-10-CM

## 2020-12-28 LAB — POCT INR: INR: 2.9 (ref 2.0–3.0)

## 2020-12-28 MED ORDER — WARFARIN SODIUM 5 MG PO TABS: ORAL_TABLET | ORAL | 0 refills | Status: DC

## 2020-12-28 NOTE — Telephone Encounter (Signed)
Called Pt and transferred her to the pharmacy.

## 2020-12-28 NOTE — Patient Instructions (Signed)
-   continue taking warfarin 1/2 a tablet daily except for 1 tablet on Mondays and Fridays.  - Recheck INR in 2 weeks.  Coumadin Clinic. [phone].

## 2020-12-30 ENCOUNTER — Telehealth: Payer: Self-pay

## 2020-12-30 ENCOUNTER — Encounter: Payer: Self-pay | Admitting: Neurology

## 2020-12-30 ENCOUNTER — Ambulatory Visit: Payer: Medicaid Other | Admitting: Pharmacist

## 2020-12-30 NOTE — Telephone Encounter (Signed)
Contacted pt to go over lab results pt is aware and doesn't have any questions or concerns 

## 2020-12-31 ENCOUNTER — Inpatient Hospital Stay: Payer: Medicaid Other | Admitting: Internal Medicine

## 2021-01-06 ENCOUNTER — Encounter: Payer: Self-pay | Admitting: Internal Medicine

## 2021-01-06 NOTE — Progress Notes (Signed)
I received a written order from [facility] dated 01/01/2021 stating that they reschedule one physical therapy visit but agency DC visit due to not being able to contact patient.

## 2021-01-07 ENCOUNTER — Ambulatory Visit: Payer: Medicaid Other | Admitting: Internal Medicine

## 2021-01-13 ENCOUNTER — Ambulatory Visit (INDEPENDENT_AMBULATORY_CARE_PROVIDER_SITE_OTHER): Payer: Self-pay | Admitting: *Deleted

## 2021-01-13 ENCOUNTER — Other Ambulatory Visit: Payer: Self-pay

## 2021-01-13 DIAGNOSIS — Z7901 Long term (current) use of anticoagulants: Secondary | ICD-10-CM

## 2021-01-13 DIAGNOSIS — Z5181 Encounter for therapeutic drug level monitoring: Secondary | ICD-10-CM

## 2021-01-13 LAB — POCT INR: INR: 3 (ref 2.0–3.0)

## 2021-01-13 NOTE — Patient Instructions (Signed)
Description   - continue taking warfarin 1/2 a tablet daily except for 1 tablet on Mondays and Fridays.  - Recheck INR in 3 weeks.  Coumadin Clinic. [phone].   Bill [phone]?

## 2021-01-15 ENCOUNTER — Telehealth: Payer: Self-pay | Admitting: Internal Medicine

## 2021-01-15 NOTE — Telephone Encounter (Signed)
FYI

## 2021-01-15 NOTE — Telephone Encounter (Signed)
Copied from [place] [phone]. Topic: General - Other >> Jan 15, 2021  9:11 AM Yvette Rack wrote: Reason for CRM: Almira a PT with [facility] stated pt will be discharged due to non-compliance. Pt missed several appts. Cb# [phone]

## 2021-01-26 ENCOUNTER — Telehealth: Payer: Self-pay

## 2021-01-26 NOTE — Telephone Encounter (Signed)
Messages received from [place] [place] and [place]/[employer] [place], the patient was denied SSDI and they are working with patient to appeal the denial.   They are still trying to assist the patient secure housing but she has utilized all her resources for emergency rental  Assistance and does not have an income.

## 2021-01-28 ENCOUNTER — Other Ambulatory Visit: Payer: Self-pay | Admitting: Internal Medicine

## 2021-01-28 ENCOUNTER — Other Ambulatory Visit: Payer: Self-pay | Admitting: Physician Assistant

## 2021-01-28 ENCOUNTER — Other Ambulatory Visit: Payer: Self-pay

## 2021-01-28 ENCOUNTER — Other Ambulatory Visit: Payer: Self-pay | Admitting: Family Medicine

## 2021-01-28 DIAGNOSIS — G621 Alcoholic polyneuropathy: Secondary | ICD-10-CM

## 2021-01-28 DIAGNOSIS — G629 Polyneuropathy, unspecified: Secondary | ICD-10-CM

## 2021-01-28 MED FILL — Pantoprazole Sodium EC Tab 40 MG (Base Equiv): ORAL | 30 days supply | Qty: 30 | Fill #0 | Status: AC

## 2021-01-28 MED FILL — Gabapentin Cap 300 MG: ORAL | 30 days supply | Qty: 30 | Fill #1 | Status: AC

## 2021-01-28 MED FILL — Folic Acid Tab 1 MG: ORAL | 30 days supply | Qty: 30 | Fill #0 | Status: AC

## 2021-01-28 NOTE — Telephone Encounter (Signed)
Requested medication (s) are due for refill today: no  Requested medication (s) are on the active medication list: yes  Last refill:  12/11/2020  Future visit scheduled: no  Notes to clinic:  this refill cannot be delegated   Requested Prescriptions  Pending Prescriptions Disp Refills   acetaminophen-codeine (TYLENOL #3) 300-30 MG tablet 15 tablet 0    Sig: TAKE 1 TABLET BY MOUTH EVERY 12 (TWELVE) HOURS AS NEEDED FOR PAIN.      Not Delegated - Analgesics:  Opioid Agonist Combinations Failed - 01/28/2021  3:14 PM      Failed - This refill cannot be delegated      Passed - Urine Drug Screen completed in last 360 days      Passed - Valid encounter within last 6 months    Recent Outpatient Visits           1 month ago Hematemesis of unknown cause   [place], MD   3 months ago Encounter for medication review   [place], Jarome Matin, RPH-CPP   4 months ago Alcoholic peripheral neuropathy [facility])   [place], MD   5 months ago [place], [place]   6 months ago Diarrhea, unspecified type   [place], [place], NP       Future Appointments             In 1 month [place], [place] [facility] [place]   In 4 months [place] Porch, Thana Farr, MD [place], [facility]

## 2021-01-28 NOTE — Telephone Encounter (Signed)
Requested medication (s) are due for refill today: yes  Requested medication (s) are on the active medication list:  yes  Last refill:  12/28/2020  Future visit scheduled: yes  Notes to clinic:  this refill cannot be delegated    Requested Prescriptions  Pending Prescriptions Disp Refills   traMADol (ULTRAM) 50 MG tablet 60 tablet 0    Sig: TAKE 1 TABLET (50 MG TOTAL) BY MOUTH EVERY 12 (TWELVE) HOURS AS NEEDED FOR UP TO 5 DAYS.      Not Delegated - Analgesics:  Opioid Agonists Failed - 01/28/2021  3:14 PM      Failed - This refill cannot be delegated      Passed - Urine Drug Screen completed in last 360 days      Passed - Valid encounter within last 6 months    Recent Outpatient Visits           1 month ago Hematemesis of unknown cause   [place], MD   3 months ago Encounter for medication review   [place], Jarome Matin, RPH-CPP   4 months ago Alcoholic peripheral neuropathy [facility])   [place], MD   5 months ago [place], [place]   6 months ago Diarrhea, unspecified type   [place], [place], NP       Future Appointments             In 1 month Patel, [place] [facility] [place]   In 4 months Marlou Porch, Thana Farr, MD [place], [facility]

## 2021-01-29 ENCOUNTER — Other Ambulatory Visit: Payer: Self-pay | Admitting: Internal Medicine

## 2021-01-29 DIAGNOSIS — G621 Alcoholic polyneuropathy: Secondary | ICD-10-CM

## 2021-01-29 NOTE — Telephone Encounter (Signed)
Requested medication (s) are due for refill today: yes  Requested medication (s) are on the active medication list: yes  Last refill:  12/24/2020  Future visit scheduled: no  Notes to clinic:  this refill cannot be delegated    Requested Prescriptions  Pending Prescriptions Disp Refills   traMADol (ULTRAM) 50 MG tablet 60 tablet 0    Sig: TAKE 1 TABLET (50 MG TOTAL) BY MOUTH EVERY 12 (TWELVE) HOURS AS NEEDED FOR UP TO 5 DAYS.      There is no refill protocol information for this order

## 2021-01-29 NOTE — Telephone Encounter (Signed)
Medication Refill - Medication: traMADol (ULTRAM) 50 MG tablet     Preferred Pharmacy (with phone number or street name):  [facility] [place] Phone:  [phone]  Fax:  [phone]       Agent: Please be advised that RX refills may take up to 3 business days. We ask that you follow-up with your pharmacy.

## 2021-01-30 ENCOUNTER — Encounter (HOSPITAL_COMMUNITY): Payer: Self-pay

## 2021-01-30 ENCOUNTER — Other Ambulatory Visit: Payer: Self-pay

## 2021-01-30 ENCOUNTER — Emergency Department (HOSPITAL_COMMUNITY): Payer: Medicaid Other

## 2021-01-30 ENCOUNTER — Emergency Department (HOSPITAL_COMMUNITY)
Admission: EM | Admit: 2021-01-30 | Discharge: 2021-01-31 | Disposition: A | Discharge status: Home / Self Care | Payer: Medicaid Other | Attending: Emergency Medicine | Admitting: Emergency Medicine

## 2021-01-30 DIAGNOSIS — M25552 Pain in left hip: Secondary | ICD-10-CM | POA: Diagnosis not present

## 2021-01-30 DIAGNOSIS — E876 Hypokalemia: Secondary | ICD-10-CM | POA: Diagnosis not present

## 2021-01-30 DIAGNOSIS — R531 Weakness: Secondary | ICD-10-CM | POA: Diagnosis not present

## 2021-01-30 DIAGNOSIS — W19XXXA Unspecified fall, initial encounter: Secondary | ICD-10-CM | POA: Insufficient documentation

## 2021-01-30 DIAGNOSIS — Z20822 Contact with and (suspected) exposure to covid-19: Secondary | ICD-10-CM | POA: Diagnosis not present

## 2021-01-30 DIAGNOSIS — I5033 Acute on chronic diastolic (congestive) heart failure: Secondary | ICD-10-CM | POA: Diagnosis not present

## 2021-01-30 DIAGNOSIS — Z87891 Personal history of nicotine dependence: Secondary | ICD-10-CM | POA: Insufficient documentation

## 2021-01-30 DIAGNOSIS — Z7901 Long term (current) use of anticoagulants: Secondary | ICD-10-CM | POA: Insufficient documentation

## 2021-01-30 DIAGNOSIS — M545 Low back pain, unspecified: Secondary | ICD-10-CM | POA: Insufficient documentation

## 2021-01-30 DIAGNOSIS — I110 Hypertensive heart disease with heart failure: Secondary | ICD-10-CM | POA: Insufficient documentation

## 2021-01-30 DIAGNOSIS — R011 Cardiac murmur, unspecified: Secondary | ICD-10-CM | POA: Diagnosis not present

## 2021-01-30 DIAGNOSIS — J45909 Unspecified asthma, uncomplicated: Secondary | ICD-10-CM | POA: Insufficient documentation

## 2021-01-30 DIAGNOSIS — Z79899 Other long term (current) drug therapy: Secondary | ICD-10-CM | POA: Diagnosis not present

## 2021-01-30 LAB — HEPATIC FUNCTION PANEL
ALT: 18 U/L (ref 0–44)
AST: 48 U/L — ABNORMAL HIGH (ref 15–41)
Albumin: 2.4 g/dL — ABNORMAL LOW (ref 3.5–5.0)
Alkaline Phosphatase: 156 U/L — ABNORMAL HIGH (ref 38–126)
Bilirubin, Direct: 0.3 mg/dL — ABNORMAL HIGH (ref 0.0–0.2)
Indirect Bilirubin: 0.3 mg/dL (ref 0.3–0.9)
Total Bilirubin: 0.6 mg/dL (ref 0.3–1.2)
Total Protein: 6.7 g/dL (ref 6.5–8.1)

## 2021-01-30 LAB — CBC WITH DIFFERENTIAL/PLATELET
Abs Immature Granulocytes: 0.03 10*3/uL (ref 0.00–0.07)
Basophils Absolute: 0 10*3/uL (ref 0.0–0.1)
Basophils Relative: 0 %
Eosinophils Absolute: 0 10*3/uL (ref 0.0–0.5)
Eosinophils Relative: 0 %
HCT: 37.2 % (ref 36.0–46.0)
Hemoglobin: 12.8 g/dL (ref 12.0–15.0)
Immature Granulocytes: 0 %
Lymphocytes Relative: 14 %
Lymphs Abs: 1.2 10*3/uL (ref 0.7–4.0)
MCH: 35.1 pg — ABNORMAL HIGH (ref 26.0–34.0)
MCHC: 34.4 g/dL (ref 30.0–36.0)
MCV: 101.9 fL — ABNORMAL HIGH (ref 80.0–100.0)
Monocytes Absolute: 0.5 10*3/uL (ref 0.1–1.0)
Monocytes Relative: 5 %
Neutro Abs: 7.1 10*3/uL (ref 1.7–7.7)
Neutrophils Relative %: 81 %
Platelets: 318 10*3/uL (ref 150–400)
RBC: 3.65 MIL/uL — ABNORMAL LOW (ref 3.87–5.11)
RDW: 13.8 % (ref 11.5–15.5)
WBC: 8.8 10*3/uL (ref 4.0–10.5)
nRBC: 0 % (ref 0.0–0.2)

## 2021-01-30 LAB — PROTIME-INR
INR: 1.7 — ABNORMAL HIGH (ref 0.8–1.2)
Prothrombin Time: 19.5 seconds — ABNORMAL HIGH (ref 11.4–15.2)

## 2021-01-30 LAB — BASIC METABOLIC PANEL
Anion gap: 15 (ref 5–15)
BUN: 9 mg/dL (ref 6–20)
CO2: 22 mmol/L (ref 22–32)
Calcium: 7.7 mg/dL — ABNORMAL LOW (ref 8.9–10.3)
Chloride: 102 mmol/L (ref 98–111)
Creatinine, Ser: 1.17 mg/dL — ABNORMAL HIGH (ref 0.44–1.00)
GFR, Estimated: 54 mL/min — ABNORMAL LOW (ref >60)
Glucose, Bld: 93 mg/dL (ref 70–99)
Potassium: 3.1 mmol/L — ABNORMAL LOW (ref 3.5–5.1)
Sodium: 139 mmol/L (ref 135–145)

## 2021-01-30 LAB — ETHANOL: Alcohol, Ethyl (B): <10 mg/dL (ref <10)

## 2021-01-30 LAB — CK: Total CK: 35 U/L — ABNORMAL LOW (ref 38–234)

## 2021-01-30 LAB — TROPONIN I (HIGH SENSITIVITY)
Troponin I (High Sensitivity): 12 ng/L (ref <18)
Troponin I (High Sensitivity): 10 ng/L (ref <18)

## 2021-01-30 LAB — BRAIN NATRIURETIC PEPTIDE: B Natriuretic Peptide: 137.4 pg/mL — ABNORMAL HIGH (ref 0.0–100.0)

## 2021-01-30 MED ORDER — WARFARIN SODIUM 7.5 MG PO TABS
7.5000 mg | ORAL_TABLET | Freq: Once | ORAL | Status: AC
  Administered 2021-01-30: 7.5 mg via ORAL
  Filled 2021-01-30: qty 1

## 2021-01-30 MED ORDER — WARFARIN - PHARMACIST DOSING INPATIENT: Freq: Every day | Status: DC

## 2021-01-30 MED ORDER — TRAMADOL HCL 50 MG PO TABS: ORAL_TABLET | ORAL | 0 refills | Status: DC

## 2021-01-30 MED ORDER — SODIUM CHLORIDE 0.9 % IV BOLUS
1000.0000 mL | Freq: Once | INTRAVENOUS | Status: AC
  Administered 2021-01-30: 1000 mL via INTRAVENOUS

## 2021-01-30 MED ORDER — THIAMINE HCL 100 MG PO TABS
100.0000 mg | ORAL_TABLET | Freq: Every day | ORAL | Status: DC
  Administered 2021-01-30 – 2021-01-31 (×2): 100 mg via ORAL
  Filled 2021-01-30 (×2): qty 1

## 2021-01-30 MED ORDER — PANTOPRAZOLE SODIUM 40 MG PO TBEC
40.0000 mg | DELAYED_RELEASE_TABLET | Freq: Two times a day (BID) | ORAL | Status: DC
  Administered 2021-01-30 – 2021-01-31 (×2): 40 mg via ORAL
  Filled 2021-01-30 (×2): qty 1

## 2021-01-30 MED ORDER — ENOXAPARIN SODIUM 60 MG/0.6ML IJ SOSY
50.0000 mg | PREFILLED_SYRINGE | Freq: Two times a day (BID) | INTRAMUSCULAR | Status: DC
  Administered 2021-01-30 – 2021-01-31 (×2): 50 mg via SUBCUTANEOUS
  Filled 2021-01-30 (×3): qty 0.5

## 2021-01-30 MED ORDER — GABAPENTIN 300 MG PO CAPS
300.0000 mg | ORAL_CAPSULE | Freq: Every day | ORAL | Status: DC
  Administered 2021-01-30: 300 mg via ORAL
  Filled 2021-01-30: qty 1

## 2021-01-30 MED ORDER — POTASSIUM CHLORIDE ER 10 MEQ PO TBCR: 10.0000 meq | EXTENDED_RELEASE_TABLET | Freq: Every day | ORAL | 0 refills | Status: DC

## 2021-01-30 MED ORDER — GABAPENTIN 100 MG PO CAPS
100.0000 mg | ORAL_CAPSULE | Freq: Two times a day (BID) | ORAL | Status: DC
  Administered 2021-01-31: 100 mg via ORAL
  Filled 2021-01-30: qty 1

## 2021-01-30 MED ORDER — FUROSEMIDE 20 MG PO TABS
20.0000 mg | ORAL_TABLET | Freq: Every day | ORAL | Status: DC
  Administered 2021-01-31: 20 mg via ORAL
  Filled 2021-01-30: qty 1

## 2021-01-30 MED ORDER — POTASSIUM CHLORIDE ER 10 MEQ PO TBCR
10.0000 meq | EXTENDED_RELEASE_TABLET | Freq: Every day | ORAL | Status: DC
  Administered 2021-01-31: 10 meq via ORAL
  Filled 2021-01-30 (×2): qty 1

## 2021-01-30 NOTE — Discharge Instructions (Addendum)
Your medical work-up in the ER was reassuring today.  We did not see signs of a brain bleed, or broken bones in your back or hip.  Your blood work was relatively normal, but did show that your potassium level was a little low, as well as your protein level in the blood.  This is probably due to a poor diet.  We talked about trying to eat food more regularly, and I prescribed potassium pills.  Please be careful taking pain medication at home, as it can make you dizzy.    Please call and schedule follow-up appoint with your primary care doctor this week.

## 2021-01-30 NOTE — ED Triage Notes (Signed)
Pt called EMS for a fall, unwitnessed and pt unable to recall how she fall.  When EMS arrived they could hear pt yelling but she couldn't get to the door had to force entry and state after that she stopped talking and yelling to them. EMS called  Family which states she is baseline secondary to stroke with presentation of aphasia and mentation all baseline.

## 2021-01-30 NOTE — ED Notes (Signed)
Daughter made aware that pt will be staying until PT can come do an evaluation.

## 2021-01-30 NOTE — Progress Notes (Signed)
ANTICOAGULATION CONSULT NOTE - Initial Consult  Pharmacy Consult for warfarin Indication: mechanical mitral valve  Allergies  Allergen Reactions  . Oxycodone Itching    Pt states no longer makes her itchy  . Tape Rash and Other (See Comments)    THE ONLY TAPE THAT IS TOLERATED IS PAPER TAPE. EKG leads inflame the skin    Patient Measurements: Weight: 54 kg (119 lb 0.8 oz)  Vital Signs: Temp: 97.2 F (36.2 C) (05/07 1725) Temp Source: Oral (05/07 1725) BP: 155/77 (05/07 2130) Pulse Rate: 92 (05/07 2130)  Labs: Recent Labs    01/30/21 1728 01/30/21 1839  HGB 12.8  --   HCT 37.2  --   PLT 318  --   LABPROT 19.5*  --   INR 1.7*  --   CREATININE 1.17*  --   CKTOTAL 35*  --   TROPONINIHS 12 10    Estimated Creatinine Clearance: 41.5 mL/min (A) (by C-G formula based on SCr of 1.17 mg/dL (H)).   Medical History: Past Medical History:  Diagnosis Date  . Abnormal liver function   . Acute diastolic heart failure ([place]) 03/15/2013  . Alcoholism ([place])   . Anemia   . B12 deficiency   . Carotid stenosis    Carotid US 2/17: bilateral ICA 1-39% >> FU prn  . Childhood asthma   . Chronic diastolic CHF (congestive heart failure) ([place])   . Complete heart block ([place])   . Epigastric hernia 200's  . GERD (gastroesophageal reflux disease)   . History of blood transfusion    "14 w/1st pregnancy; 2 w/last C-section" (03/15/2013)  . Hx of echocardiogram    a. Echo 4/16:  Mild focal basal septal hypertrophy, EF 60-65%, no RWMA, Mechanical MVR ok with mild central regurgitation and no perivalvular leak, small mobile density attached to valvular ring (1.5x1 cm) - post surgical changes vs SBE, mild LAE;   b. Echo 2/17:  Mild post wall LVH, EF 55-60%, no RWMA, mechanical MVR ok (mean 5 mmHg), normal RVSF  . Hypertension    no pcp   will go to [facility]  . Macrocytic anemia   . Protein calorie malnutrition ([place])   . 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 S/P mitral valve  replacement with metallic valve 2/63/7858   31mm Sorin [employer] mechanical prosthesis via right mini thoracotomy approach  . 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 Severe mitral regurgitation 04/22/2013  . Sinus tachycardia   . SVT (supraventricular tachycardia) ([place]) 03/16/2013  . TIA (transient ischemic attack)   . Tricuspid regurgitation      Assessment: 26 yoF admitted with fall. Pt on warfarin PTA for hx mechanical mitral valve. CT imaging negative for bleeding. INR subtherapeutic at 1.7 on admit, CBC stable. Last warfarin dose was 5/6 prior to admission.  *Home warfarin dose 5mg  Mon/Fri, 2.5mg  all other days  Goal of Therapy:  INR 2.5-3.5 Monitor platelets by anticoagulation protocol: Yes   Plan:  Warfarin 7.5mg  x1 tonight Add enoxaparin bridge while INR subtherapeutic  Arrie Senate, PharmD, BCPS, [facility] Clinical Pharmacist [phone] Please check AMION for all [place] numbers 01/30/2021

## 2021-01-30 NOTE — Progress Notes (Signed)
Orthopedic Tech Progress Note Patient Details:  Mary [name] 1960-10-03 [id number] Level 2 trauma Patient ID: Mary [name], female   DOB: [birth year], 60 y.o.   MRN: [id number]   Mary [name] 01/30/2021, 5:44 PM

## 2021-01-30 NOTE — ED Provider Notes (Signed)
[facility] EMERGENCY DEPARTMENT Provider Note   CSN: [id number] Arrival date & time: 01/30/21  1620     History Chief Complaint  Patient presents with  . Fall    Mary [name] is a 60 y.o. female w/ pmhx of VHD (mechanical mitral valve and tricuspid [phone] chronic Coumadin), CHF,EtOH abuse, alcohol induced neuropathy and myopathydiastolic CHF,presenting from home with a fall.  The patient is very poor historian and cannot recall why she fell.  EMS reports they received a call from the house the patient had fallen.  They were unable to get in and had to knock down the door.  EMS did speak to family members reported the patient has baseline aphasia from a prior stroke, this is not a new symptom.  Patient self arrives in the ED complaining of pain "all over my body".  Specifically she appears of pain in her lower back and her left hip.  She denies a headache.  She is very upset that she urinated on herself.  She denies a history of seizures.  Her daughter Jonelle Sidle provides additional history.  She reports the patient lives with her son in [place].  The patient is mostly independent, but has a history of ongoing etoh abuse and alcoholism, and denies this to everyone.  She has alcohol neuropathy.  Her daughter states, "If she slurs her words, it's probably because she's been drinking, nine times out of ten."  Her daughter reports that she needs to be involved in her mother's care, even though her mother persistently refuses to update family on her medical issues, they are ultimately the ones who are assisting with her care.  Her daughter tells me the patient has a history of lying about medical issues, particularly those related to drinking.  HPI     Past Medical History:  Diagnosis Date  . Abnormal liver function   . Acute diastolic heart failure ([place]) 03/15/2013  . Alcoholism ([place])   . Anemia   . B12 deficiency   . Carotid stenosis    Carotid US 2/17:  bilateral ICA 1-39% >> FU prn  . Childhood asthma   . Chronic diastolic CHF (congestive heart failure) ([place])   . Complete heart block ([place])   . Epigastric hernia 200's  . GERD (gastroesophageal reflux disease)   . History of blood transfusion    "14 w/1st pregnancy; 2 w/last C-section" (03/15/2013)  . Hx of echocardiogram    a. Echo 4/16:  Mild focal basal septal hypertrophy, EF 60-65%, no RWMA, Mechanical MVR ok with mild central regurgitation and no perivalvular leak, small mobile density attached to valvular ring (1.5x1 cm) - post surgical changes vs SBE, mild LAE;   b. Echo 2/17:  Mild post wall LVH, EF 55-60%, no RWMA, mechanical MVR ok (mean 5 mmHg), normal RVSF  . Hypertension    no pcp   will go to [facility]  . Macrocytic anemia   . Protein calorie malnutrition ([place])   . 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 S/P mitral valve replacement with metallic valve 4/40/1027   89mm Sorin Carbomedics mechanical prosthesis via right mini thoracotomy approach  . 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   Complex valvuloplasty including Cor-matrix ECM patch augmentation of anterior and lateral leaflets with 61mm Edwards mc3 ring annuloplasty via right mini-thoracotomy approach  . Severe mitral regurgitation 04/22/2013  . Sinus tachycardia   . SVT (supraventricular tachycardia) ([place]) 03/16/2013  . TIA (transient ischemic attack)   . Tricuspid regurgitation  Patient Active Problem List   Diagnosis Date Noted  . Marijuana user 12/24/2020  . LFT elevation   . Acquired pyloric stenosis   . GI bleed 11/24/2020  . Alcoholic peripheral neuropathy ([place]) 09/01/2020  . Thiamin deficiency 09/01/2020  . Paresthesia   . Vitamin B12 deficiency 05/04/2020  . Vomiting 04/24/2020  . GERD (gastroesophageal reflux disease)   . Marijuana abuse   . Macrocytic anemia 04/04/2019  . Intermittent complete heart block ([place])   . Syncope 03/28/2019  . Orthostatic hypotension 01/11/2017  .  Elevated liver enzymes 01/11/2017  . Atypical chest pain 06/05/2016  . Elevated INR 01/10/2016  . TIA (transient ischemic attack) 09/13/2015  . CVA (cerebral vascular accident) ([place]) 09/12/2015  . SOB (shortness of breath) 02/24/2014  . First degree heart block 06/17/2013  . (HFpEF) heart failure with preserved ejection fraction ([place]) 06/16/2013  . History of bacterial endocarditis 06/16/2013  . Warfarin anticoagulation 06/10/2013  . S/P mitral valve replacement with metallic valve 02/40/9735  . Femoral hernia 05/23/2013  . Mitral valve insufficiency 04/22/2013  . Tricuspid regurgitation 04/22/2013  . 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 SVT (supraventricular tachycardia) ([place]) 03/16/2013    Past Surgical History:  Procedure Laterality Date  . APPENDECTOMY  [id number]  . BALLOON DILATION N/A 11/27/2020   Procedure: BALLOON DILATION;  Surgeon: Gatha Mayer, MD;  Location: [facility] ENDOSCOPY;  Service: Endoscopy;  Laterality: N/A;  . BIOPSY  05/01/2020   Procedure: BIOPSY;  Surgeon: Milus Banister, MD;  Location: [place];  Service: Endoscopy;;  . CARDIAC CATHETERIZATION    . CARDIAC VALVE REPLACEMENT    . CESAREAN SECTION  1983  . CESAREAN SECTION WITH BILATERAL TUBAL LIGATION  1999  . COLONOSCOPY WITH PROPOFOL N/A 05/01/2020   Procedure: COLONOSCOPY WITH PROPOFOL;  Surgeon: Milus Banister, MD;  Location: [facility] ENDOSCOPY;  Service: Endoscopy;  Laterality: N/A;  . ESOPHAGOGASTRODUODENOSCOPY (EGD) WITH PROPOFOL N/A 05/01/2020   Procedure: ESOPHAGOGASTRODUODENOSCOPY (EGD) WITH PROPOFOL;  Surgeon: Milus Banister, MD;  Location: [facility] ENDOSCOPY;  Service: Endoscopy;  Laterality: N/A;  . ESOPHAGOGASTRODUODENOSCOPY (EGD) WITH PROPOFOL N/A 11/27/2020   Procedure: ESOPHAGOGASTRODUODENOSCOPY (EGD) WITH PROPOFOL;  Surgeon: Gatha Mayer, MD;  Location: [place];  Service: Endoscopy;  Laterality: N/A;  . FEMORAL HERNIA REPAIR Right 05/23/2013   Procedure: HERNIA REPAIR FEMORAL;   Surgeon: Rexene Alberts, MD;  Location: [place];  Service: Open Heart Surgery;  Laterality: Right;  . INTRAOPERATIVE TRANSESOPHAGEAL ECHOCARDIOGRAM N/A 05/23/2013   Procedure: INTRAOPERATIVE TRANSESOPHAGEAL ECHOCARDIOGRAM;  Surgeon: Rexene Alberts, MD;  Location: [place];  Service: Open Heart Surgery;  Laterality: N/A;  . LAPAROSCOPIC CHOLECYSTECTOMY  2003  . LEFT AND RIGHT HEART CATHETERIZATION WITH CORONARY ANGIOGRAM N/A 03/22/2013   Procedure: LEFT AND RIGHT HEART CATHETERIZATION WITH CORONARY ANGIOGRAM;  Surgeon: Burnell Blanks, MD;  Location: [facility] CATH LAB;  Service: Cardiovascular;  Laterality: N/A;  . MINIMALLY INVASIVE TRICUSPID VALVE REPAIR Right 05/23/2013   Procedure: MINIMALLY INVASIVE TRICUSPID VALVE REPAIR;  Surgeon: Rexene Alberts, MD;  Location: [place];  Service: Open Heart Surgery;  Laterality: Right;  . MITRAL VALVE REPLACEMENT N/A 05/23/2013   Procedure: MITRAL VALVE (MV) REPLACEMENT;  Surgeon: Rexene Alberts, MD;  Location: [place];  Service: Open Heart Surgery;  Laterality: N/A;  . MULTIPLE EXTRACTIONS WITH ALVEOLOPLASTY N/A 04/04/2013   Procedure: Extraction of tooth #'s 1,8,9,13,14,15,23,24,25,26 with alveoloplasty and gross debridement of remaining teeth;  Surgeon: Lenn Cal, DDS;  Location: [facility];  Service: Oral Surgery;  Laterality: N/A;  . TEE WITHOUT CARDIOVERSION N/A 03/18/2013   Procedure: TRANSESOPHAGEAL ECHOCARDIOGRAM (TEE);  Surgeon: Larey Dresser, MD;  Location: [place];  Service: Cardiovascular;  Laterality: N/A;  . TEE WITHOUT CARDIOVERSION N/A 06/17/2013   Procedure: TRANSESOPHAGEAL ECHOCARDIOGRAM (TEE);  Surgeon: Lelon Perla, MD;  Location: [facility] ENDOSCOPY;  Service: Cardiovascular;  Laterality: N/A;  . TUBAL LIGATION  1999     OB History   No obstetric history on file.     Family History  Problem Relation Age of Onset  . Stroke Father   . Cancer Father   . Sarcoidosis Sister   . Heart attack Neg Hx   . Colon cancer Neg Hx   . Stomach  cancer Neg Hx   . Pancreatic cancer Neg Hx   . Esophageal cancer Neg Hx     Social History   Tobacco Use  . Smoking status: Former Smoker    Packs/day: 0.50    Years: 36.00    Pack years: 18.00    Types: Cigarettes    Quit date: 03/15/2013    Years since quitting: 7.8  . Smokeless tobacco: Never Used  Vaping Use  . Vaping Use: Never used  Substance Use Topics  . Alcohol use: Yes    Comment:  "glass of wine"  . Drug use: No    Home Medications Prior to Admission medications   Medication Sig Start Date End Date Taking? Authorizing Provider  folic acid (FOLVITE) 1 MG tablet TAKE 1 TABLET (1 MG TOTAL) BY MOUTH DAILY. Patient taking differently: Take 1 mg by mouth daily. 10/06/20 10/06/21 Yes [name], MD  furosemide (LASIX) 20 MG tablet TAKE 1 TABLET (20 MG TOTAL) BY MOUTH DAILY. Patient taking differently: Take 20 mg by mouth daily as needed for fluid (takes with Potassium 74meq). 12/24/20 12/24/21 Yes [name], MD  gabapentin (NEURONTIN) 100 MG capsule Take 1 capsule (100 mg total) by mouth 2 (two) times daily. And continue 300 mg at bedtime 08/27/20  Yes Mayers, Cari S, PA-C  gabapentin (NEURONTIN) 300 MG capsule TAKE 1 CAPSULE (300 MG TOTAL) BY MOUTH AT BEDTIME. Patient taking differently: Take 300 mg by mouth at bedtime. 12/24/20 12/24/21 Yes [name], MD  Naphazoline HCl (CLEAR EYES OP) Place 1 drop into both eyes continuous as needed (dry/irritated eyes).   Yes [provider]  pantoprazole (PROTONIX) 40 MG tablet TAKE 1 TABLET (40 MG TOTAL) BY MOUTH 2 (TWO) TIMES DAILY. Patient taking differently: Take 40 mg by mouth 2 (two) times daily. 12/24/20 12/24/21 Yes [name], MD  potassium chloride (KLOR-CON) 10 MEQ tablet TAKE 1 TABLET (10 MEQ TOTAL) BY MOUTH DAILY. TO TAKE WITH FLUID PILL Patient taking differently: Take 10 mEq by mouth daily as needed (swelling. Takes with Furosemide.). 12/24/20 12/24/21 Yes [name], MD  potassium  chloride (KLOR-CON) 10 MEQ tablet Take 1 tablet (10 mEq total) by mouth daily for 30 doses. 01/30/21 03/01/21 Yes Mykah Bellomo, Carola Rhine, MD  thiamine 100 MG tablet TAKE 1 TABLET (100 MG TOTAL) BY MOUTH DAILY. Patient taking differently: Take 100 mg by mouth daily. 12/24/20 12/24/21 Yes [name], MD  traMADol (ULTRAM) 50 MG tablet TAKE 1 TABLET (50 MG TOTAL) BY MOUTH EVERY 12 (TWELVE) HOURS AS NEEDED FOR UP TO 5 DAYS. Patient taking differently: Take 100 mg by mouth every 12 (twelve) hours as needed for moderate pain. 01/30/21 07/31/21 Yes [name], MD  warfarin (COUMADIN) 5 MG tablet Take 2.5mg   to 5mg  daily or as directed by Coumadin clinic Patient taking differently: Take 2.5-5 mg by mouth daily. Take 2.5mg  every day except take 5mg  on Monday and Friday. 12/28/20  Yes Burnell Blanks, MD  vitamin B-12 (CYANOCOBALAMIN) 500 MCG tablet TAKE 500 MCG BY MOUTH DAILY. Patient not taking: No sig reported 12/24/20 12/24/21  [name], MD    Allergies    Oxycodone and Tape  Review of Systems   Review of Systems  Constitutional: Negative for chills and fever.  HENT: Negative for ear pain and sore throat.   Eyes: Negative for pain and visual disturbance.  Respiratory: Negative for cough and shortness of breath.   Cardiovascular: Negative for chest pain and palpitations.  Gastrointestinal: Negative for abdominal pain and vomiting.  Genitourinary: Negative for dysuria and hematuria.  Musculoskeletal: Positive for arthralgias and myalgias.  Skin: Negative for color change and rash.  Neurological: Negative for syncope and headaches.  All other systems reviewed and are negative.   Physical Exam Updated Vital Signs BP 125/78 (BP Location: Right Arm)   Pulse 90   Temp (!) 97.2 F (36.2 C) (Oral)   Resp 20   Wt 54 kg   LMP 03/22/2013   SpO2 100%   BMI 21.77 kg/m   Physical Exam Constitutional:      General: She is not in acute distress.    Comments: Slurred speech   HENT:     Head: Normocephalic and atraumatic.  Eyes:     Conjunctiva/sclera: Conjunctivae normal.     Pupils: Pupils are equal, round, and reactive to light.  Cardiovascular:     Rate and Rhythm: Rhythm irregular.     Pulses: Normal pulses.     Heart sounds: Murmur heard.    Pulmonary:     Effort: Pulmonary effort is normal. No respiratory distress.  Abdominal:     General: There is no distension.     Tenderness: There is no abdominal tenderness.  Musculoskeletal:     Comments: L spine midline tenderness Left hip pain with hip flexion (mild)  Skin:    General: Skin is warm and dry.  Neurological:     General: No focal deficit present.     Mental Status: She is alert. Mental status is at baseline.     ED Results / Procedures / Treatments   Labs (all labs ordered are listed, but only abnormal results are displayed) Labs Reviewed  BASIC METABOLIC PANEL - Abnormal; Notable for the following components:      Result Value   Potassium 3.1 (*)    Creatinine, Ser 1.17 (*)    Calcium 7.7 (*)    GFR, Estimated 54 (*)    All other components within normal limits  CBC WITH DIFFERENTIAL/PLATELET - Abnormal; Notable for the following components:   RBC 3.65 (*)    MCV 101.9 (*)    MCH 35.1 (*)    All other components within normal limits  PROTIME-INR - Abnormal; Notable for the following components:   Prothrombin Time 19.5 (*)    INR 1.7 (*)    All other components within normal limits  CK - Abnormal; Notable for the following components:   Total CK 35 (*)    All other components within normal limits  BRAIN NATRIURETIC PEPTIDE - Abnormal; Notable for the following components:   B Natriuretic Peptide 137.4 (*)    All other components within normal limits  HEPATIC FUNCTION PANEL - Abnormal; Notable for the following components:   Albumin  2.4 (*)    AST 48 (*)    Alkaline Phosphatase 156 (*)    Bilirubin, Direct 0.3 (*)    All other components within normal limits   PROTIME-INR - Abnormal; Notable for the following components:   Prothrombin Time 21.2 (*)    INR 1.8 (*)    All other components within normal limits  RESP PANEL BY RT-PCR (FLU A&B, COVID) [id number]  ETHANOL  URINALYSIS, ROUTINE W REFLEX MICROSCOPIC  RAPID URINE DRUG SCREEN, HOSP PERFORMED  TROPONIN I (HIGH SENSITIVITY)  TROPONIN I (HIGH SENSITIVITY)    EKG EKG Interpretation  Date/Time:  Sunday Jan 31 2021 08:27:04 EDT Ventricular Rate:  97 PR Interval:  161 QRS Duration: 99 QT Interval:  385 QTC Calculation: 490 R Axis:   10 Text Interpretation: Sinus rhythm Minimal ST depression, anterolateral leads Borderline prolonged QT interval Confirmed by Alvester Chourifan, Nachelle Negrette [phone]([id number]) on 01/31/2021 10:48:12 AM   Radiology CT Head Wo Contrast  Result Date: 01/30/2021 CLINICAL DATA:  Altered mental status and poly trauma. EXAM: CT HEAD WITHOUT CONTRAST TECHNIQUE: Contiguous axial images were obtained from the base of the skull through the vertex without intravenous contrast. COMPARISON:  CT head dated 11/24/2020. FINDINGS: Brain: No evidence of acute infarction, hemorrhage, hydrocephalus, extra-axial collection or mass lesion/mass effect. There is mild cerebral volume loss with associated ex vacuo dilatation. Vascular: No hyperdense vessel or unexpected calcification. Skull: Normal. Negative for fracture or focal lesion. Sinuses/Orbits: No acute finding. Other: None. IMPRESSION: No acute intracranial process. Electronically Signed   By: Romona Curlsyler  Litton M.D.   On: 01/30/2021 18:36   CT Lumbar Spine Wo Contrast  Result Date: 01/30/2021 CLINICAL DATA:  Poly trauma.  Critical.  Fall.  Back pain. EXAM: CT LUMBAR SPINE WITHOUT CONTRAST TECHNIQUE: Multidetector CT imaging of the lumbar spine was performed without intravenous contrast administration. Multiplanar CT image reconstructions were also generated. COMPARISON:  CT lumbar spine without contrast 11/25/2020. CT of the abdomen pelvis with contrast 11/26/2020.  FINDINGS: Segmentation: 5 non rib-bearing lumbar type vertebral bodies are present. The lowest fully formed vertebral body is L5. Alignment: No significant listhesis is present. No significant curvature is present. Lumbar lordosis preserved. Vertebrae: Vertebral body heights are maintained. No acute or healing fractures are present. Paraspinal and other soft tissues: Cholecystectomy noted. Limited imaging the abdomen is otherwise unremarkable. There is no significant adenopathy. No solid organ lesions are present. Disc levels: L1-2: Negative. L2-3: Negative. L3-4: Mild disc bulging is stable. Disc extends into the neural foramina bilaterally without significant stenosis. L4-5: Broad-based disc bulging is present.  No significant stenosis. L5-S1: Negative. IMPRESSION: 1. No acute or healing fractures. 2. Stable mild disc disease at L3-4 and L4-5 without significant stenosis. Electronically Signed   By: Marin Robertshristopher  Mattern M.D.   On: 01/30/2021 18:14   CT Hip Left Wo Contrast  Result Date: 01/30/2021 CLINICAL DATA:  Status post fall on Coumadin.  Left hip pain. EXAM: CT OF THE LEFT HIP WITHOUT CONTRAST TECHNIQUE: Multidetector CT imaging of the left hip was performed according to the standard protocol. Multiplanar CT image reconstructions were also generated. COMPARISON:  CT right hip 11/30/2020.  Pelvic CT 11/26/2020. FINDINGS: Bones/Joint/Cartilage No evidence of acute fracture, dislocation or femoral head avascular necrosis. No significant hip joint space narrowing or joint effusion. The visualized left hemipelvis appears intact. The left sacroiliac joint appears intact. Ligaments Suboptimally assessed by CT. Muscles and Tendons Unremarkable. Soft tissues No periarticular hematoma or other fluid collection. The visualized internal pelvic contents appear unremarkable. IMPRESSION: 1.  No evidence of acute left hip fracture or dislocation. No significant hip joint effusion or arthropathy. 2. No evidence  periarticular hematoma. Electronically Signed   By: Richardean Sale M.D.   On: 01/30/2021 18:20    Procedures Procedures   Medications Ordered in ED Medications  furosemide (LASIX) tablet 20 mg (20 mg Oral Given 01/31/21 0943)  gabapentin (NEURONTIN) capsule 300 mg (300 mg Oral Given 01/30/21 2133)  pantoprazole (PROTONIX) EC tablet 40 mg (40 mg Oral Given 01/31/21 0943)  potassium chloride (KLOR-CON) CR tablet 10 mEq (10 mEq Oral Given 01/31/21 0943)  thiamine tablet 100 mg (100 mg Oral Given 01/31/21 0944)  gabapentin (NEURONTIN) capsule 100 mg (100 mg Oral Given 01/31/21 0944)  Warfarin - Pharmacist Dosing Inpatient (has no administration in time range)  enoxaparin (LOVENOX) injection 50 mg (50 mg Subcutaneous Given 01/31/21 0945)  HYDROcodone-acetaminophen (NORCO/VICODIN) 5-325 MG per tablet 1 tablet (1 tablet Oral Given 01/31/21 0943)  warfarin (COUMADIN) tablet 7.5 mg (has no administration in time range)  sodium chloride 0.9 % bolus 1,000 mL (0 mLs Intravenous Stopped 01/30/21 2135)  warfarin (COUMADIN) tablet 7.5 mg (7.5 mg Oral Given 01/30/21 2248)  loperamide (IMODIUM) capsule 2 mg (2 mg Oral Given 01/31/21 0944)    ED Course  I have reviewed the triage vital signs and the nursing notes.  Pertinent labs & imaging results that were available during my care of the patient were reviewed by me and considered in my medical decision making (see chart for details).  This patient presents with syncope vs fall.  This involves an extensive number of treatment options, and is a complaint that carries with it a high risk of complications and morbidity.  The differential diagnosis includes polypharmacy vs dehydration vs etoh intoxication vs arrhythmia vs anemia vs other  I ordered, reviewed, and interpreted labs.  CK 35.  K 3.1 (chronic hypokalemia), Cr near baseline at 1.17, BNP 137, Hgb normal 12.8, WBC 8.8.  Alb low 2.4. INR subtherapeutic at 1.7.  Trop 12 -> 10. I ordered medication IV fluids for  hydration I ordered imaging studies which included CTH, CT L spine, CT left hip I independently visualized and interpreted imaging which showed no life-threatening abnormalities, no acute fracture, and the monitor tracing which showed sinus tachycardia Additional history was obtained from patient's daughter [name] by phone ECG reviewed showing sinus rhythm, QTc 490, no acute ischemic changes  Vitals reassessed and HR normalized.  All vitals wnl.  We attempted to discharge patient home, but found that she could not ambulate without assistance.   Given she is a fall risk on coumadin, we will board her overnight and have her assessed by PT in the morning.  She has requested additional assistance at home - [facility] consult also placed.   I've reordered her home medications including gabapentin as prescribed for her pain.  I would avoid opioids if possible, although she is intermittently prescribed these by her PCP.    Pharmacy consulted to assist with coumadin dosing.  K ordered for hypokalemia.     Clinical Course as of 01/31/21 1051  Sat Jan 30, 2021  1849 IMPRESSION: 1. No evidence of acute left hip fracture or dislocation. No significant hip joint effusion or arthropathy. 2. No evidence periarticular hematoma. [MT]  1850 IMPRESSION: No acute intracranial process. [MT]  2121 I reassessed the patient.  Although her mental status appears to be improved, we had difficulty ambulating her.  Is really not clear how much of this may be chronic.  There seems to be a poor effort.  However she does not have support set up at home, as her son she lives with works 2 jobs and cannot be with her. We'll have her evaluated by PT tomorrow and place a TOC consult for home health [MT]    Clinical Course User Index [MT] Denni France, Carola Rhine, MD    Final Clinical Impression(s) / ED Diagnoses Final diagnoses:  Fall, initial encounter  Hypokalemia  Weakness    Rx / DC Orders ED Discharge Orders          Ordered    potassium chloride (KLOR-CON) 10 MEQ tablet  Daily        01/30/21 2107           Wyvonnia Dusky, MD 01/31/21 1051

## 2021-01-30 NOTE — ED Notes (Signed)
Unable to obtain IV and obtain blood and no portable monitors which is delaying pt CT scans Pt went to CT with this RN on monitor at 1751

## 2021-01-30 NOTE — Progress Notes (Signed)
   01/30/21 1655  Clinical Encounter Type  Visited With Patient not available  Visit Type Trauma  Referral From Nurse  Consult/Referral To Chaplain   Chaplain responded to Level 2 trauma. Pt being treated and no support person present. No chaplain currently needed. Chaplain remains available.   This note was prepared by Chaplain Resident, Dante Gang, MDiv. Chaplain remains available as needed through the on-call pager: [phone].

## 2021-01-30 NOTE — ED Triage Notes (Signed)
Pt is in C-collar denies neck pain, c/o of lower back pain

## 2021-01-30 NOTE — ED Notes (Signed)
Pt ambulated, pt needed assistance, pt became winded with ambulation and stated "that's enough I need to sit down"

## 2021-01-30 NOTE — ED Notes (Signed)
Phlebotomy at bedside.

## 2021-01-31 LAB — PROTIME-INR
INR: 1.8 — ABNORMAL HIGH (ref 0.8–1.2)
Prothrombin Time: 21.2 seconds — ABNORMAL HIGH (ref 11.4–15.2)

## 2021-01-31 LAB — RESP PANEL BY RT-PCR (FLU A&B, COVID) ARPGX2
Influenza A by PCR: NEGATIVE
Influenza B by PCR: NEGATIVE
SARS Coronavirus 2 by RT PCR: NEGATIVE

## 2021-01-31 MED ORDER — HYDROCODONE-ACETAMINOPHEN 5-325 MG PO TABS
1.0000 | ORAL_TABLET | ORAL | Status: DC | PRN
  Administered 2021-01-31 (×2): 1 via ORAL
  Filled 2021-01-31 (×2): qty 1

## 2021-01-31 MED ORDER — WARFARIN SODIUM 7.5 MG PO TABS
7.5000 mg | ORAL_TABLET | Freq: Once | ORAL | Status: DC
  Filled 2021-01-31: qty 1

## 2021-01-31 MED ORDER — LOPERAMIDE HCL 2 MG PO CAPS
2.0000 mg | ORAL_CAPSULE | Freq: Once | ORAL | Status: AC
  Administered 2021-01-31: 2 mg via ORAL
  Filled 2021-01-31: qty 1

## 2021-01-31 NOTE — ED Provider Notes (Signed)
1:40 PM-as planned, patient has been seen by physical therapy and evaluated by case management.  PT is recommending using walker for ambulation assistance.  They agree with home health PT, which has been ordered.  Case management has seen the patient.  Patient presented yesterday after a fall and had apparent generalized pain.  She was medically cleared.  It is reported that she is an alcoholic.  At this time, she is sitting up, alert and cooperative, in no apparent distress.  She understands the treatment plan and states she is ready to go home.  Recommend symptomatic treatment, and follow-up with PCP for ongoing management.  Home health services have been ordered by me.   Daleen Bo, MD 01/31/21 [phone]

## 2021-01-31 NOTE — TOC Initial Note (Addendum)
Transition of Care [facility]) - Initial/Assessment Note    Patient Details  Name: Mary [name] MRN: [id number] Date of Birth: 1960/10/29  [facility]) CM/SW Contact:    Verdell Carmine, RN Phone Number: 01/31/2021, 8:17 AM  Clinical Narrative:                 Presents to the ED today with a unwitnessed fall via EMS Patient on warfarin. INR subtheraputic. Imaging reveals no fracture, bleed. Works with [employer], case Oceanographer. Has been declined for disability, is appealing. Has been discharged from home health due to noncompliance history of ETOH valve replacement missing appointments. Is looing for housing. PT will be evaluating patient today in ED for recommendations.  1200: PT evaluation recommended Home Heath discussed with Rn/MD, orders placed reached out to [facility] for services. [facility] could not accept reached out to [facility] unable to accept. Sent secure message to CM of [facility] to discuss case and  HH needs of this patient . 1600 reached out to [facility] with pair to see if they can take this patient ordered a 3:1 to be delivered to home for shower chair/ hiieghtening toilet seat.   Expected Discharge Plan: Home/Self Care Barriers to Discharge: Insurance Authorization,Continued Medical Work up   Patient Goals and CMS Choice        Expected Discharge Plan and Services Expected Discharge Plan: Home/Self Care   Discharge Planning Services: CM Consult   Living arrangements for the past 2 months: Apartment                                      Prior Living Arrangements/Services Living arrangements for the past 2 months: Apartment Lives with:: Adult Children Patient language and need for interpreter reviewed:: Yes          Care giver support system in place?: Yes (comment)   Criminal Activity/Legal Involvement Pertinent to Current Situation/Hospitalization: No - Comment as needed  Activities of Daily Living       Permission Sought/Granted                  Emotional Assessment       Orientation: : Oriented to Self,Oriented to Place Alcohol / Substance Use: Alcohol Use Psych Involvement: No (comment)  Admission diagnosis:  fall Patient Active Problem List   Diagnosis Date Noted  . Marijuana user 12/24/2020  . LFT elevation   . Acquired pyloric stenosis   . GI bleed 11/24/2020  . Alcoholic peripheral neuropathy ([place]) 09/01/2020  . Thiamin deficiency 09/01/2020  . Paresthesia   . Vitamin B12 deficiency 05/04/2020  . Vomiting 04/24/2020  . GERD (gastroesophageal reflux disease)   . Marijuana abuse   . Macrocytic anemia 04/04/2019  . Intermittent complete heart block ([place])   . Syncope 03/28/2019  . Orthostatic hypotension 01/11/2017  . Elevated liver enzymes 01/11/2017  . Atypical chest pain 06/05/2016  . Elevated INR 01/10/2016  . TIA (transient ischemic attack) 09/13/2015  . CVA (cerebral vascular accident) ([place]) 09/12/2015  . SOB (shortness of breath) 02/24/2014  . First degree heart block 06/17/2013  . (HFpEF) heart failure with preserved ejection fraction ([place]) 06/16/2013  . History of bacterial endocarditis 06/16/2013  . Warfarin anticoagulation 06/10/2013  . S/P mitral valve replacement with metallic valve 01/75/1025  . Femoral hernia 05/23/2013  . Mitral valve insufficiency 04/22/2013  . Tricuspid regurgitation 04/22/2013  .  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 SVT (supraventricular tachycardia) ([place]) 03/16/2013   PCP:  [name], MD Pharmacy:   [facility] [place] - [address] [place] [address] Phone: [phone] Fax: [phone]  [facility] [address] [place] [address] Phone: [phone] Fax: [place] [address] Phone: [phone] Fax:  [phone]     Social Determinants of Health (SDOH) Interventions    Readmission Risk Interventions Readmission Risk Prevention Plan 12/02/2020  Transportation Screening Complete  PCP or Specialist appointment within 3-5 days of discharge Not Complete  PCP/Specialist Appt Not Complete comments First available appt was already scheduled on 4/7.  [place] or Home Care Consult Complete  SW Recovery Care/Counseling Consult Complete  Palliative Care Screening Not Applicable  Skilled Nursing Facility Not Applicable  Some recent data might be hidden

## 2021-01-31 NOTE — Progress Notes (Signed)
[place] for warfarin Indication: mechanical mitral valve  Allergies  Allergen Reactions  . Oxycodone Itching    Pt states no longer makes her itchy  . Tape Rash and Other (See Comments)    THE ONLY TAPE THAT IS TOLERATED IS PAPER TAPE. EKG leads inflame the skin    Patient Measurements: Weight: 54 kg (119 lb 0.8 oz)  Vital Signs: BP: 125/78 (05/08 0942) Pulse Rate: 90 (05/08 0942)  Labs: Recent Labs    01/30/21 1728 01/30/21 1839 01/31/21 0750  HGB 12.8  --   --   HCT 37.2  --   --   PLT 318  --   --   LABPROT 19.5*  --  21.2*  INR 1.7*  --  1.8*  CREATININE 1.17*  --   --   CKTOTAL 35*  --   --   TROPONINIHS 12 10  --     Estimated Creatinine Clearance: 41.5 mL/min (A) (by C-G formula based on SCr of 1.17 mg/dL (H)).   Medical History: Past Medical History:  Diagnosis Date  . Abnormal liver function   . Acute diastolic heart failure ([place]) 03/15/2013  . Alcoholism ([place])   . Anemia   . B12 deficiency   . Carotid stenosis    Carotid US 2/17: bilateral ICA 1-39% >> FU prn  . Childhood asthma   . Chronic diastolic CHF (congestive heart failure) ([place])   . Complete heart block ([place])   . Epigastric hernia 200's  . GERD (gastroesophageal reflux disease)   . History of blood transfusion    "14 w/1st pregnancy; 2 w/last C-section" (03/15/2013)  . Hx of echocardiogram    a. Echo 4/16:  Mild focal basal septal hypertrophy, EF 60-65%, no RWMA, Mechanical MVR ok with mild central regurgitation and no perivalvular leak, small mobile density attached to valvular ring (1.5x1 cm) - post surgical changes vs SBE, mild LAE;   b. Echo 2/17:  Mild post wall LVH, EF 55-60%, no RWMA, mechanical MVR ok (mean 5 mmHg), normal RVSF  . Hypertension    no pcp   will go to [facility]  . Macrocytic anemia   . Protein calorie malnutrition ([place])   . 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 S/P mitral valve replacement with metallic valve  4/54/0981   89mm Sorin Carbomedics mechanical prosthesis via right mini thoracotomy approach  . 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   Complex valvuloplasty including Cor-matrix ECM patch augmentation of anterior and lateral leaflets with 38mm Edwards mc3 ring annuloplasty via right mini-thoracotomy approach  . Severe mitral regurgitation 04/22/2013  . Sinus tachycardia   . SVT (supraventricular tachycardia) ([place]) 03/16/2013  . TIA (transient ischemic attack)   . Tricuspid regurgitation      Assessment: 59 yoF admitted with fall. Pt on warfarin PTA for hx mechanical mitral valve. CT imaging negative for bleeding. INR subtherapeutic at 1.7 on admit, CBC stable. Last warfarin dose was 5/6 prior to admission.  *Home warfarin dose 5mg  Mon/Fri, 2.5mg  all other days  INR remains subtherapeutic at 1.8  Goal of Therapy:  INR 2.5-3.5 Monitor platelets by anticoagulation protocol: Yes   Plan:  Warfarin 7.5mg  PO x 1 tonight Continue enoxaparin 1mg /kg (50mg ) SQ q 12h Daily INR, s/s bleeding  Bertis Ruddy, PharmD Clinical Pharmacist ED Pharmacist Phone # [phone] 01/31/2021 10:30 AM

## 2021-01-31 NOTE — ED Notes (Signed)
Physical Therapist at bedside.

## 2021-01-31 NOTE — Evaluation (Signed)
Physical Therapy Evaluation Patient Details Name: Mary [name] MRN: [id number] DOB: 1961/01/28 Today's Date: 01/31/2021   History of Present Illness  Pt is a 60 y.o. F who presents after an unwitnessed fall. Imaging negative for acute fracture and CT head negative for acute intracranial process. Significant PMH: CVA, HFpEF, alcohol and marijuana use, mitral valve replacement.  Clinical Impression  Pt admitted with above. Presents fairly close to her functional baseline based on her report and other admissions. Pt displays generalized weakness, decreased endurance, and balance deficits. Requiring supervision for transfers, ambulating x 60 feet with a cane and up to min assist for stability. Recommended use of walker for bilateral hand support and balance. Pt reports she has adequate assist at home from family members and does not have concerns about stair negotiation. Would benefit from HHPT follow up to address deficits and maximize functional mobility.     Follow Up Recommendations Home health PT;Supervision for mobility/OOB    Equipment Recommendations  None recommended by PT (pt declining w/c)   Recommendations for Other Services       Precautions / Restrictions Precautions Precautions: Fall Restrictions Weight Bearing Restrictions: No      Mobility  Bed Mobility Overal bed mobility: Needs Assistance Bed Mobility: Supine to Sit;Sit to Supine     Supine to sit: Supervision Sit to supine: Supervision   General bed mobility comments: No physical A required    Transfers Overall transfer level: Needs assistance Equipment used: Straight cane Transfers: Sit to/from Stand Sit to Stand: Supervision         General transfer comment: Supervision for safety from stretcher and toilet  Ambulation/Gait Ambulation/Gait assistance: Min assist;Min guard Gait Distance (Feet): 60 Feet Assistive device: Straight cane Gait Pattern/deviations: Step-through  pattern;Antalgic;Decreased stride length Gait velocity: decreased Gait velocity interpretation: <1.31 ft/sec, indicative of household ambulator General Gait Details: Slow speed, reaching for external support with LUE, requires up to minA with fatigue  Stairs            Wheelchair Mobility    Modified Rankin (Stroke Patients Only)       Balance Overall balance assessment: Needs assistance Sitting-balance support: Feet supported Sitting balance-Leahy Scale: Good     Standing balance support: Single extremity supported;During functional activity Standing balance-Leahy Scale: Fair Standing balance comment: Fair statically                             Pertinent Vitals/Pain Pain Assessment: Faces Faces Pain Scale: Hurts a little bit Pain Location: Left hip Pain Descriptors / Indicators: Grimacing Pain Intervention(s): Monitored during session    Home Living Family/patient expects to be discharged to:: Private residence Living Arrangements: Alone Available Help at Discharge: Family;Available PRN/intermittently Type of Home: Apartment Home Access: Stairs to enter   Entrance Stairs-Number of Steps: 2 flights Home Layout: One level Home Equipment: Cane - single point;Walker - 2 wheels;Bedside commode;Shower seat      Prior Function Level of Independence: Needs assistance   Gait / Transfers Assistance Needed: Mainly uses walker, limited household ambulator  ADL's / Homemaking Assistance Needed: Requires assist for IADL's from family        Hand Dominance   Dominant Hand: Right    Extremity/Trunk Assessment   Upper Extremity Assessment Upper Extremity Assessment: Generalized weakness    Lower Extremity Assessment Lower Extremity Assessment: Generalized weakness;RLE deficits/detail;LLE deficits/detail RLE Sensation: history of peripheral neuropathy LLE Sensation: history of peripheral neuropathy  Communication   Communication: No  difficulties  Cognition Arousal/Alertness: Awake/alert Behavior During Therapy: Flat affect Overall Cognitive Status: No family/caregiver present to determine baseline cognitive functioning                                 General Comments: Flat affect, answers questions appropriately and follows commands, decreased STM (does not recall falling)      General Comments      Exercises     Assessment/Plan    PT Assessment Patient needs continued PT services  PT Problem List Decreased strength;Decreased activity tolerance;Decreased balance;Decreased mobility;Decreased range of motion;Decreased safety awareness;Impaired sensation;Pain       PT Treatment Interventions DME instruction;Gait training;Stair training;Functional mobility training;Therapeutic activities;Therapeutic exercise;Balance training;Patient/family education    PT Goals (Current goals can be found in the Care Plan section)  Acute Rehab PT Goals Patient Stated Goal: go home PT Goal Formulation: With patient Time For Goal Achievement: 02/14/21 Potential to Achieve Goals: Good    Frequency Min 3X/week   Barriers to discharge Inaccessible home environment      Co-evaluation               AM-PAC PT "6 Clicks" Mobility  Outcome Measure Help needed turning from your back to your side while in a flat bed without using bedrails?: None Help needed moving from lying on your back to sitting on the side of a flat bed without using bedrails?: A Little Help needed moving to and from a bed to a chair (including a wheelchair)?: A Little Help needed standing up from a chair using your arms (e.g., wheelchair or bedside chair)?: A Little Help needed to walk in hospital room?: A Little Help needed climbing 3-5 steps with a railing? : A Lot 6 Click Score: 18    End of Session Equipment Utilized During Treatment: Gait belt Activity Tolerance: Patient tolerated treatment well Patient left: in bed;with call  bell/phone within reach Nurse Communication: Mobility status PT Visit Diagnosis: Unsteadiness on feet (R26.81);Other abnormalities of gait and mobility (R26.89);History of falling (Z91.81);Muscle weakness (generalized) (M62.81)    Time: 3976-7341 PT Time Calculation (min) (ACUTE ONLY): 20 min   Charges:   PT Evaluation $PT Eval Moderate Complexity: 1 Mod          Wyona Almas, PT, DPT Acute Rehabilitation Services Pager [phone] Office [place] 01/31/2021, 8:57 AM

## 2021-02-01 ENCOUNTER — Other Ambulatory Visit: Payer: Self-pay

## 2021-02-02 ENCOUNTER — Other Ambulatory Visit: Payer: Self-pay

## 2021-02-03 ENCOUNTER — Telehealth: Payer: Self-pay

## 2021-02-03 ENCOUNTER — Other Ambulatory Visit: Payer: Self-pay

## 2021-02-03 NOTE — Telephone Encounter (Signed)
Following up on a patient request for a sooner appt with the PCP for a HFU. Called 2xs and LVM for the patient to return a phone call to [facility]

## 2021-02-04 ENCOUNTER — Telehealth: Payer: Self-pay

## 2021-02-04 NOTE — Telephone Encounter (Signed)
Call placed to patient # [phone] regarding scheduling an appointment at [facility]. Message left with call back requested to this CM.  Also need to explain to patient that her medicaid plan is out of network with [facility].

## 2021-02-18 ENCOUNTER — Ambulatory Visit (INDEPENDENT_AMBULATORY_CARE_PROVIDER_SITE_OTHER): Payer: Medicaid Other

## 2021-02-18 ENCOUNTER — Other Ambulatory Visit: Payer: Self-pay

## 2021-02-18 DIAGNOSIS — Z5181 Encounter for therapeutic drug level monitoring: Secondary | ICD-10-CM | POA: Diagnosis not present

## 2021-02-18 DIAGNOSIS — Z7901 Long term (current) use of anticoagulants: Secondary | ICD-10-CM | POA: Diagnosis not present

## 2021-02-18 LAB — POCT INR: INR: 1.1 — AB (ref 2.0–3.0)

## 2021-02-18 MED ORDER — WARFARIN SODIUM 5 MG PO TABS: ORAL_TABLET | ORAL | 1 refills | Status: DC

## 2021-02-18 NOTE — Patient Instructions (Signed)
-   take extra 1/2 tablet today and tomorrow, then  - continue taking warfarin 1/2 a tablet daily except for 1 tablet on Mondays and Fridays.  - Recheck INR in 3 weeks.  Coumadin Clinic. [phone].

## 2021-03-04 ENCOUNTER — Other Ambulatory Visit: Payer: Self-pay

## 2021-03-04 ENCOUNTER — Other Ambulatory Visit: Payer: Self-pay | Admitting: Internal Medicine

## 2021-03-04 DIAGNOSIS — G621 Alcoholic polyneuropathy: Secondary | ICD-10-CM

## 2021-03-04 MED ORDER — TRAMADOL HCL 50 MG PO TABS
ORAL_TABLET | ORAL | 0 refills | Status: DC
  Filled 2021-03-04: qty 10, 5d supply, fill #0
  Filled 2021-03-15: qty 10, 5d supply, fill #1
  Filled 2021-03-22: qty 10, 5d supply, fill #2
  Filled 2021-03-26: qty 10, 5d supply, fill #3
  Filled 2021-04-01: qty 10, 5d supply, fill #4
  Filled 2021-04-06: qty 10, 5d supply, fill #5

## 2021-03-04 MED FILL — Gabapentin Cap 300 MG: ORAL | 30 days supply | Qty: 30 | Fill #2 | Status: AC

## 2021-03-04 MED FILL — Folic Acid Tab 1 MG: ORAL | 30 days supply | Qty: 30 | Fill #1 | Status: AC

## 2021-03-04 NOTE — Telephone Encounter (Signed)
Requested medication (s) are due for refill today:   Provider to review  Requested medication (s) are on the active medication list:   Yes  Future visit scheduled:   No   Last ordered: 01/30/2021 #60, 0 refills  Returned because it's a non delegated refill   Requested Prescriptions  Pending Prescriptions Disp Refills   traMADol (ULTRAM) 50 MG tablet 60 tablet 0    Sig: TAKE 1 TABLET (50 MG TOTAL) BY MOUTH EVERY 12 (TWELVE) HOURS AS NEEDED FOR UP TO 5 DAYS.      Not Delegated - Analgesics:  Opioid Agonists Failed - 03/04/2021 10:18 AM      Failed - This refill cannot be delegated      Passed - Urine Drug Screen completed in last 360 days      Passed - Valid encounter within last 6 months    Recent Outpatient Visits           2 months ago Hematemesis of unknown cause   [place], MD   4 months ago Encounter for medication review   [place], Jarome Matin, RPH-CPP   6 months ago Alcoholic peripheral neuropathy [facility])   [place], MD   6 months ago [place], [place]   7 months ago Diarrhea, unspecified type   [place], [place], NP       Future Appointments             In 2 weeks Alda Berthold, [place] [facility] [place]   In 3 months Marlou Porch, Thana Farr, MD [place], [facility]

## 2021-03-05 ENCOUNTER — Other Ambulatory Visit: Payer: Self-pay

## 2021-03-08 ENCOUNTER — Other Ambulatory Visit: Payer: Self-pay

## 2021-03-15 ENCOUNTER — Ambulatory Visit (INDEPENDENT_AMBULATORY_CARE_PROVIDER_SITE_OTHER): Payer: Medicaid Other

## 2021-03-15 ENCOUNTER — Other Ambulatory Visit: Payer: Self-pay

## 2021-03-15 DIAGNOSIS — Z7901 Long term (current) use of anticoagulants: Secondary | ICD-10-CM

## 2021-03-15 LAB — POCT INR: INR: 1.7 — AB (ref 2.0–3.0)

## 2021-03-15 MED ORDER — WARFARIN SODIUM 5 MG PO TABS: ORAL_TABLET | ORAL | 1 refills | Status: DC

## 2021-03-15 NOTE — Patient Instructions (Signed)
Description   Take 1.5 tablets today, then start taking warfarin 1/2 a tablet daily except for 1 tablet on Mondays, Wednesdays and Fridays.  Recheck INR in 2 weeks.  Coumadin Clinic. [phone].   Bill [phone]?

## 2021-03-18 ENCOUNTER — Inpatient Hospital Stay: Payer: Medicaid Other | Admitting: Internal Medicine

## 2021-03-22 ENCOUNTER — Ambulatory Visit: Payer: Medicaid Other | Admitting: Neurology

## 2021-03-22 ENCOUNTER — Other Ambulatory Visit: Payer: Self-pay

## 2021-03-26 ENCOUNTER — Telehealth: Payer: Self-pay | Admitting: Internal Medicine

## 2021-03-26 ENCOUNTER — Other Ambulatory Visit: Payer: Self-pay

## 2021-03-26 NOTE — Telephone Encounter (Signed)
Copied from [place] [id number]. Topic: General - Inquiry >> Mar 26, 2021 10:22 AM Greggory Keen D wrote: Reason for CRM: Pt would like a nurse to call her back and go over her medications with her.  She feels she is missing some medications that she should be taking.

## 2021-03-26 NOTE — Telephone Encounter (Signed)
Lurena Joiner could you follow up with pt

## 2021-03-26 NOTE — Telephone Encounter (Signed)
She fills at our pharmacy. I have instructed her to call our pharmacy to make see what she has been getting.

## 2021-04-01 ENCOUNTER — Other Ambulatory Visit: Payer: Self-pay

## 2021-04-01 ENCOUNTER — Other Ambulatory Visit: Payer: Self-pay | Admitting: Internal Medicine

## 2021-04-01 DIAGNOSIS — G621 Alcoholic polyneuropathy: Secondary | ICD-10-CM

## 2021-04-01 MED ORDER — GABAPENTIN 300 MG PO CAPS
ORAL_CAPSULE | Freq: Every day | ORAL | 2 refills | Status: DC
Start: 2021-04-01 — End: 2021-05-24
  Filled 2021-04-01: qty 30, 30d supply, fill #0

## 2021-04-01 MED FILL — Furosemide Tab 20 MG: ORAL | 30 days supply | Qty: 30 | Fill #1 | Status: AC

## 2021-04-01 MED FILL — Pantoprazole Sodium EC Tab 40 MG (Base Equiv): ORAL | 15 days supply | Qty: 30 | Fill #1 | Status: AC

## 2021-04-05 ENCOUNTER — Telehealth: Payer: Self-pay | Admitting: *Deleted

## 2021-04-05 NOTE — Telephone Encounter (Signed)
Pt missed Anticoagulation Appt this morning; called pt and had to leave a message for her to call back to reschedule appt.

## 2021-04-06 ENCOUNTER — Other Ambulatory Visit: Payer: Self-pay

## 2021-04-06 ENCOUNTER — Other Ambulatory Visit: Payer: Self-pay | Admitting: Physician Assistant

## 2021-04-06 DIAGNOSIS — G629 Polyneuropathy, unspecified: Secondary | ICD-10-CM

## 2021-04-06 MED FILL — Folic Acid Tab 1 MG: ORAL | 30 days supply | Qty: 30 | Fill #2 | Status: AC

## 2021-04-07 ENCOUNTER — Other Ambulatory Visit: Payer: Self-pay

## 2021-04-09 ENCOUNTER — Other Ambulatory Visit: Payer: Self-pay | Admitting: Internal Medicine

## 2021-04-09 ENCOUNTER — Other Ambulatory Visit: Payer: Self-pay

## 2021-04-09 DIAGNOSIS — G621 Alcoholic polyneuropathy: Secondary | ICD-10-CM

## 2021-04-09 MED ORDER — TRAMADOL HCL 50 MG PO TABS
ORAL_TABLET | ORAL | 0 refills | Status: DC
  Filled 2021-04-09: qty 10, 5d supply, fill #0
  Filled 2021-04-16: qty 10, 5d supply, fill #1
  Filled 2021-04-22: qty 10, 5d supply, fill #2
  Filled 2021-04-27: qty 10, 5d supply, fill #3
  Filled 2021-04-30: qty 10, 5d supply, fill #4

## 2021-04-09 NOTE — Telephone Encounter (Signed)
Requested medication (s) are due for refill today   Provider to review  Requested medication (s) are on the active medication list Yes  Future visit scheduled Yes   04/29/21 with Levada Dy and 8/29 with Dr.Johnson  Note to clinic-medication not delegated for [facility]   Requested Prescriptions  Pending Prescriptions Disp Refills   traMADol (ULTRAM) 50 MG tablet 60 tablet 0    Sig: TAKE 1 TABLET (50 MG TOTAL) BY MOUTH EVERY 12 (TWELVE) HOURS AS NEEDED FOR UP TO 5 DAYS.      Not Delegated - Analgesics:  Opioid Agonists Failed - 04/09/2021  3:23 PM      Failed - This refill cannot be delegated      Passed - Urine Drug Screen completed in last 360 days      Passed - Valid encounter within last 6 months    Recent Outpatient Visits           3 months ago Hematemesis of unknown cause   [place], MD   6 months ago Encounter for medication review   [place], Jarome Matin, RPH-CPP   7 months ago Alcoholic peripheral neuropathy [facility])   [place], MD   7 months ago [place], [place]   9 months ago Diarrhea, unspecified type   [place], [place], NP       Future Appointments             In 2 weeks Thereasa Solo, Dionne Bucy, PA-C [place]   In 1 month Posey Pronto, Delena Serve, [place] [facility] [place]   In 1 month [name], MD [place]   In 2 months Skains, Thana Farr, MD [place], [facility]

## 2021-04-12 ENCOUNTER — Telehealth: Payer: Self-pay

## 2021-04-12 ENCOUNTER — Other Ambulatory Visit: Payer: Self-pay

## 2021-04-12 NOTE — Telephone Encounter (Signed)
Pt missed INR check this am. Attempted to contact pt to see if she can come in this afternoon. Left message asking her to call back to reschedule

## 2021-04-16 ENCOUNTER — Other Ambulatory Visit: Payer: Self-pay

## 2021-04-16 ENCOUNTER — Ambulatory Visit (INDEPENDENT_AMBULATORY_CARE_PROVIDER_SITE_OTHER): Payer: Medicaid Other

## 2021-04-16 DIAGNOSIS — Z7901 Long term (current) use of anticoagulants: Secondary | ICD-10-CM

## 2021-04-16 LAB — POCT INR: INR: 1 — AB (ref 2.0–3.0)

## 2021-04-16 NOTE — Patient Instructions (Signed)
Description   Take 1.5 tablets today and tomorrow, then start taking warfarin 1 tablet daily except for 1/2 tablet on Sundays and Thursdays.  Recheck INR in 1 week.  Coumadin Clinic. [phone].   Bill [phone]?

## 2021-04-22 ENCOUNTER — Other Ambulatory Visit: Payer: Self-pay

## 2021-04-23 ENCOUNTER — Ambulatory Visit (INDEPENDENT_AMBULATORY_CARE_PROVIDER_SITE_OTHER): Payer: Medicaid Other

## 2021-04-23 ENCOUNTER — Other Ambulatory Visit: Payer: Self-pay

## 2021-04-23 DIAGNOSIS — Z5181 Encounter for therapeutic drug level monitoring: Secondary | ICD-10-CM | POA: Diagnosis not present

## 2021-04-23 DIAGNOSIS — Z7901 Long term (current) use of anticoagulants: Secondary | ICD-10-CM | POA: Diagnosis not present

## 2021-04-23 LAB — POCT INR: INR: 1.2 — AB (ref 2.0–3.0)

## 2021-04-23 NOTE — Patient Instructions (Signed)
Description   Take 1.5 tablets today and tomorrow, then start taking warfarin 1 tablet daily.  Recheck INR in 1 week. Be consistent with Ensure (3x week). Coumadin Clinic. [phone].   Bill [phone]?

## 2021-04-27 ENCOUNTER — Other Ambulatory Visit: Payer: Self-pay

## 2021-04-29 ENCOUNTER — Other Ambulatory Visit: Payer: Self-pay

## 2021-04-29 ENCOUNTER — Ambulatory Visit: Payer: Medicaid Other

## 2021-04-29 ENCOUNTER — Ambulatory Visit: Payer: Medicaid Other | Attending: Physician Assistant | Admitting: Physician Assistant

## 2021-04-29 DIAGNOSIS — Z7901 Long term (current) use of anticoagulants: Secondary | ICD-10-CM

## 2021-04-29 LAB — POCT INR: INR: 1.4 — AB (ref 2.0–3.0)

## 2021-04-29 NOTE — Patient Instructions (Signed)
Description   Start taking warfarin 1.5 tablets daily except 1 tablet on Mondays, Wednesdays and Fridays.   Recheck INR in 1 week. Be consistent with Ensure (3x week). Coumadin Clinic. [phone].   Bill [phone]?

## 2021-04-30 ENCOUNTER — Other Ambulatory Visit: Payer: Self-pay

## 2021-05-03 ENCOUNTER — Other Ambulatory Visit: Payer: Self-pay

## 2021-05-06 ENCOUNTER — Other Ambulatory Visit: Payer: Self-pay

## 2021-05-06 ENCOUNTER — Other Ambulatory Visit: Payer: Self-pay | Admitting: Internal Medicine

## 2021-05-06 DIAGNOSIS — G621 Alcoholic polyneuropathy: Secondary | ICD-10-CM

## 2021-05-06 DIAGNOSIS — G629 Polyneuropathy, unspecified: Secondary | ICD-10-CM

## 2021-05-06 MED FILL — Potassium Chloride Tab ER 10 mEq: ORAL | 30 days supply | Qty: 30 | Fill #1 | Status: AC

## 2021-05-06 MED FILL — Pantoprazole Sodium EC Tab 40 MG (Base Equiv): ORAL | 15 days supply | Qty: 30 | Fill #2 | Status: AC

## 2021-05-07 ENCOUNTER — Other Ambulatory Visit: Payer: Self-pay

## 2021-05-07 ENCOUNTER — Ambulatory Visit (INDEPENDENT_AMBULATORY_CARE_PROVIDER_SITE_OTHER): Payer: Medicaid Other

## 2021-05-07 DIAGNOSIS — Z5181 Encounter for therapeutic drug level monitoring: Secondary | ICD-10-CM | POA: Diagnosis not present

## 2021-05-07 DIAGNOSIS — Z7901 Long term (current) use of anticoagulants: Secondary | ICD-10-CM

## 2021-05-07 LAB — POCT INR: INR: 1.4 — AB (ref 2.0–3.0)

## 2021-05-07 MED ORDER — FOLIC ACID 1 MG PO TABS
ORAL_TABLET | Freq: Every day | ORAL | 0 refills | Status: DC
  Filled 2021-05-07: qty 30, 30d supply, fill #0

## 2021-05-07 NOTE — Patient Instructions (Signed)
Description   Take 1.5 tablets today and 2 tablets tomorrow then start taking warfarin 1.5 tablets daily except 1 tablet on Wedneadays.   Recheck INR in 1 week. Be consistent with Ensure (3x week). Coumadin Clinic. [phone].   Bill [phone]?

## 2021-05-07 NOTE — Telephone Encounter (Signed)
   Requested medications are on the active medication list yes  Last visit 3/31  Future visit scheduled 8/29  Notes to clinic Not Delegated.

## 2021-05-07 NOTE — Telephone Encounter (Signed)
Pt called in to follow up on her refill request for Tramadol.   Pt would like to have Rx sent to the pharmacy below   [place] ([place]), [place] [address] Phone:  [phone]  Fax:  [phone]

## 2021-05-11 ENCOUNTER — Other Ambulatory Visit: Payer: Self-pay | Admitting: Internal Medicine

## 2021-05-11 DIAGNOSIS — G621 Alcoholic polyneuropathy: Secondary | ICD-10-CM

## 2021-05-11 NOTE — Telephone Encounter (Signed)
Requested medications are due for refill today.  yes  Requested medications are on the active medications list.  yes  Last refill. 04/09/2021  Future visit scheduled.   yes  Notes to clinic.  Medication not delegated.

## 2021-05-14 ENCOUNTER — Other Ambulatory Visit: Payer: Self-pay | Admitting: Internal Medicine

## 2021-05-14 DIAGNOSIS — G621 Alcoholic polyneuropathy: Secondary | ICD-10-CM

## 2021-05-14 NOTE — Telephone Encounter (Signed)
Copied from [place] [phone]. Topic: Quick Communication - Rx Refill/Question >> May 14, 2021 10:50 AM Leward Quan A wrote: Medication: traMADol (ULTRAM) 50 MG tablet   Has the patient contacted their pharmacy? Yes.   (Agent: If no, request that the patient contact the pharmacy for the refill.) (Agent: If yes, when and what did the pharmacy advise?)  Preferred Pharmacy (with phone number or street name): [facility] [place]  Phone:  [phone] Fax:  [phone]     Agent: Please be advised that RX refills may take up to 3 business days. We ask that you follow-up with your pharmacy.

## 2021-05-14 NOTE — Telephone Encounter (Signed)
Requested medication (s) are due for refill today: Yes  Requested medication (s) are on the active medication list: Yes  Last refill:  04/09/21  Future visit scheduled: Yes  Notes to clinic:  See request.    Requested Prescriptions  Pending Prescriptions Disp Refills   traMADol (ULTRAM) 50 MG tablet 60 tablet 0    Sig: TAKE 1 TABLET (50 MG TOTAL) BY MOUTH EVERY 12 (TWELVE) HOURS AS NEEDED FOR UP TO 5 DAYS.     Not Delegated - Analgesics:  Opioid Agonists Failed - 05/14/2021 10:55 AM      Failed - This refill cannot be delegated      Passed - Urine Drug Screen completed in last 360 days      Passed - Valid encounter within last 6 months    Recent Outpatient Visits           4 months ago Hematemesis of unknown cause   [place], MD   7 months ago Encounter for medication review   [place], Jarome Matin, RPH-CPP   8 months ago Alcoholic peripheral neuropathy [facility])   [place], MD   8 months ago [place], [place]   10 months ago Diarrhea, unspecified type   [place], [place], NP       Future Appointments             In 1 week Alda Berthold, [facility] [place]   In 1 week [name], MD [place]   In 1 month Skains, Thana Farr, MD [place], [facility]

## 2021-05-17 NOTE — Telephone Encounter (Signed)
Pt is calling to check on the status of the medication. Pt states that she is out of medication since Thursday.

## 2021-05-19 NOTE — Telephone Encounter (Signed)
Pt is calling checking on tramadol refill  °

## 2021-05-20 ENCOUNTER — Telehealth: Payer: Self-pay

## 2021-05-20 ENCOUNTER — Other Ambulatory Visit: Payer: Self-pay | Admitting: Cardiology

## 2021-05-20 ENCOUNTER — Other Ambulatory Visit: Payer: Self-pay | Admitting: Internal Medicine

## 2021-05-20 ENCOUNTER — Telehealth (INDEPENDENT_AMBULATORY_CARE_PROVIDER_SITE_OTHER): Payer: Self-pay

## 2021-05-20 DIAGNOSIS — Z7901 Long term (current) use of anticoagulants: Secondary | ICD-10-CM

## 2021-05-20 DIAGNOSIS — G621 Alcoholic polyneuropathy: Secondary | ICD-10-CM

## 2021-05-20 NOTE — Telephone Encounter (Signed)
   Notes to clinic:  Patient has appt for 05/24/2021 Review for short supply    Requested Prescriptions  Pending Prescriptions Disp Refills   traMADol (ULTRAM) 50 MG tablet 60 tablet 0    Sig: TAKE 1 TABLET (50 MG TOTAL) BY MOUTH EVERY 12 (TWELVE) HOURS AS NEEDED FOR UP TO 5 DAYS.     Not Delegated - Analgesics:  Opioid Agonists Failed - 05/20/2021 11:37 AM      Failed - This refill cannot be delegated      Passed - Urine Drug Screen completed in last 360 days      Passed - Valid encounter within last 6 months    Recent Outpatient Visits           4 months ago Hematemesis of unknown cause   [place], MD   7 months ago Encounter for medication review   [place], Jarome Matin, RPH-CPP   8 months ago Alcoholic peripheral neuropathy [facility])   [place], MD   8 months ago [place], [place]   10 months ago Diarrhea, unspecified type   [place], [place], NP       Future Appointments             In 4 days Alda Berthold, [place] [facility] [place]   In 4 days [name], MD [place]   In 1 month Skains, Thana Farr, MD [place], [facility]

## 2021-05-20 NOTE — Telephone Encounter (Signed)
Copied from [place] [id number]. Topic: Quick Communication - Rx Refill/Question >> May 20, 2021 11:35 AM Tessa Lerner A wrote: Medication: traMADol (ULTRAM) 50 MG tablet [id number]   Has the patient contacted their pharmacy? Yes.   (Agent: If no, request that the patient contact the pharmacy for the refill.) (Agent: If yes, when and what did the pharmacy advise?)  Preferred Pharmacy (with phone number or street name): [facility] [place] [facility] [place]  Phone:  [phone] Fax:  [phone]   Agent: Please be advised that RX refills may take up to 3 business days. We ask that you follow-up with your pharmacy.

## 2021-05-20 NOTE — Telephone Encounter (Signed)
LMOM FOR OVERDUE INR 

## 2021-05-20 NOTE — Telephone Encounter (Signed)
Copied from [place] [id number]. Topic: General - Other >> May 20, 2021 11:33 AM Tessa Lerner A wrote: Reason for CRM: Patient has been notified that their refill request for traMADol (ULTRAM) 50 MG tablet [id number]  has been denied  The patient would like to discuss this denial further with a member of staff when possible

## 2021-05-21 NOTE — Telephone Encounter (Signed)
Patient called in to inquire about refill requested please say she is in extreme pain and Tylenol is not helping. Please advise Ph# [phone]

## 2021-05-21 NOTE — Telephone Encounter (Signed)
Prescription refill request received for warfarin Lov: 12/21/2020 Next INR check: 8/19 Warfarin tablet strength: '5mg'$   Pt is overdue for her INR check but her warfarin dose has increased significantly this past month. Pt is scheduled to come in on 8/30. Will send in refill for a 1 month supply so that pt does not run out of medication.

## 2021-05-21 NOTE — Telephone Encounter (Signed)
Will forward to provider  

## 2021-05-24 ENCOUNTER — Ambulatory Visit: Payer: Medicaid Other | Admitting: Neurology

## 2021-05-24 ENCOUNTER — Other Ambulatory Visit: Payer: Self-pay

## 2021-05-24 ENCOUNTER — Other Ambulatory Visit: Payer: Self-pay | Admitting: Internal Medicine

## 2021-05-24 ENCOUNTER — Ambulatory Visit: Payer: Medicaid Other | Attending: Internal Medicine | Admitting: Internal Medicine

## 2021-05-24 DIAGNOSIS — G621 Alcoholic polyneuropathy: Secondary | ICD-10-CM

## 2021-05-24 DIAGNOSIS — Z954 Presence of other heart-valve replacement: Secondary | ICD-10-CM | POA: Diagnosis not present

## 2021-05-24 DIAGNOSIS — E519 Thiamine deficiency, unspecified: Secondary | ICD-10-CM

## 2021-05-24 DIAGNOSIS — E538 Deficiency of other specified B group vitamins: Secondary | ICD-10-CM | POA: Diagnosis not present

## 2021-05-24 MED ORDER — TRAMADOL HCL 50 MG PO TABS: ORAL_TABLET | ORAL | 0 refills | Status: DC

## 2021-05-24 MED ORDER — THIAMINE HCL 100 MG PO TABS: 100.0000 mg | ORAL_TABLET | Freq: Every day | ORAL | 1 refills | Status: DC

## 2021-05-24 MED ORDER — GABAPENTIN 300 MG PO CAPS: 300.0000 mg | ORAL_CAPSULE | Freq: Two times a day (BID) | ORAL | 5 refills | Status: DC

## 2021-05-24 NOTE — Progress Notes (Signed)
Patient ID: Mary [name], female   DOB: [birth year], 60 y.o.   MRN: [id number] Virtual Visit via Telephone Note  I connected with ZAREA SMART on 05/24/2021 at 2:15 p.m by telephone and verified that I am speaking with the correct person using two identifiers  Location: Patient: home Provider: office  Participants: Myself Patient   I discussed the limitations, risks, security and privacy concerns of performing an evaluation and management service by telephone and the availability of in person appointments. I also discussed with the patient that there may be a patient responsible charge related to this service. The patient expressed understanding and agreed to proceed.   History of Present Illness: Patient with history of VHD (mechanical mitral valve and tricuspid repair 2014 on chronic Coumadin), CHF with preserved EF, history of SVT, EtOH abuse, alcohol induced neuropathy and myopathy, diastolic CHF, macrocytic anemia, documented B12 and thiamine def, erosive gastritis, pyloric stenosis.  Last seen 11/2020.  This is for chronic ds management  Pt reports that one of her legs was larger than the other but that has resolved.    ETOH peripheral neuropathy:  no show for neurology appt in June.  Pt reports transportation issues.  Had appt with them this a.m but arrived late and appt rescheduled for next mth -reports her neuropathy symptoms are worse.  Reports shooting pain from feet to knees.  Taking Gabapentin 100 mg BID and 300 mg at night.  Feels the Gabapentin does not last as long.  Request RF on Tramadol.  Was taking Tylenol when she ran out.  She feels the tramadol in combination with the gabapentin really helps.  Reports she is getting around more at home.  She was seen in the emergency room in May for fall.  No further falls since 01/2021.  Uses Cane when out doors and walker in house.   -reports she has remained free of ETOH since 07/2020.   Vit B 12/Thiamine Def:  taking [id number] mcg  and Folic acid daily.  Does not have Thiamine.    CHF/mechanical MV:  chronic SOB which comes and goes.  No LE edema and CP.  Takings Furosemide PRN. She is on Coumadin and is followed through the cardiology clinic.  Looks like she missed a few appointments recently.  No bruising.  Has appt for INR check tomorrow  Outpatient Encounter Medications as of 05/24/2021  Medication Sig   folic acid (FOLVITE) 1 MG tablet TAKE 1 TABLET (1 MG TOTAL) BY MOUTH DAILY.   furosemide (LASIX) 20 MG tablet TAKE 1 TABLET (20 MG TOTAL) BY MOUTH DAILY. (Patient taking differently: Take 20 mg by mouth daily as needed for fluid (takes with Potassium 8mq).)   gabapentin (NEURONTIN) 100 MG capsule Take 1 capsule (100 mg total) by mouth 2 (two) times daily. And continue 300 mg at bedtime   gabapentin (NEURONTIN) 300 MG capsule TAKE 1 CAPSULE (300 MG TOTAL) BY MOUTH AT BEDTIME.   Naphazoline HCl (CLEAR EYES OP) Place 1 drop into both eyes continuous as needed (dry/irritated eyes).   pantoprazole (PROTONIX) 40 MG tablet TAKE 1 TABLET (40 MG TOTAL) BY MOUTH 2 (TWO) TIMES DAILY. (Patient taking differently: Take 40 mg by mouth 2 (two) times daily.)   potassium chloride (KLOR-CON) 10 MEQ tablet TAKE 1 TABLET (10 MEQ TOTAL) BY MOUTH DAILY. TO TAKE WITH FLUID PILL (Patient taking differently: Take 10 mEq by mouth daily as needed (swelling. Takes with Furosemide.).)   potassium chloride (KLOR-CON) 10 MEQ tablet Take 1  tablet (10 mEq total) by mouth daily for 30 doses.   thiamine 100 MG tablet TAKE 1 TABLET (100 MG TOTAL) BY MOUTH DAILY. (Patient taking differently: Take 100 mg by mouth daily.)   traMADol (ULTRAM) 50 MG tablet TAKE 1 TABLET (50 MG TOTAL) BY MOUTH EVERY 12 (TWELVE) HOURS AS NEEDED FOR UP TO 5 DAYS.   vitamin B-12 (CYANOCOBALAMIN) 500 MCG tablet TAKE 500 MCG BY MOUTH DAILY. (Patient not taking: No sig reported)   warfarin (COUMADIN) 5 MG tablet Take 1 to 1&1/2 tablets by mouth daily as directed by the coumadin clinic    No facility-administered encounter medications on file as of 05/24/2021.      Observations/Objective: No direct observation done as this was a telephone encounter.  Assessment and Plan: 1. Alcoholic peripheral neuropathy ([place]) Commended her for remaining free of alcohol. We will up the gabapentin to 300 mg in the morning and in the evening.  She will continue to take 100 mg in the middle of the day. Inform patient that we will continue the tramadol however we will decrease the amount per month from 60 tablets to 50 tablets.  I will try to eventually get her down to 30 tablets/month.  [place] controlled substance reporting system reviewed.  Controlled substance prescribing agreement up-to-date.  She is tolerating the tramadol without significant side effects and reports increased function on the medication. - gabapentin (NEURONTIN) 300 MG capsule; Take 1 capsule (300 mg total) by mouth 2 (two) times daily.  Dispense: 60 capsule; Refill: 5 - traMADol (ULTRAM) 50 MG tablet; 1 tab PO 1-2 times a day PRN pain  Dispense: 50 tablet; Refill: 0 - CBC; Future - Comprehensive metabolic panel; Future - Lipid panel; Future  2. Vitamin B12 deficiency Continue B12 supplement.  3. Thiamin deficiency - thiamine 100 MG tablet; Take 1 tablet (100 mg total) by mouth daily.  Dispense: 100 tablet; Refill: 1  4. S/P mitral valve replacement with metallic valve Encouraged her to keep appointments for INR check.   Follow Up Instructions: 7 wks for pap   I discussed the assessment and treatment plan with the patient. The patient was provided an opportunity to ask questions and all were answered. The patient agreed with the plan and demonstrated an understanding of the instructions.   The patient was advised to call back or seek an in-person evaluation if the symptoms worsen or if the condition fails to improve as anticipated.  I  Spent 22 minutes on this telephone encounter  Karle Plumber,  MD

## 2021-05-25 ENCOUNTER — Ambulatory Visit (INDEPENDENT_AMBULATORY_CARE_PROVIDER_SITE_OTHER): Payer: Medicaid Other

## 2021-05-25 ENCOUNTER — Other Ambulatory Visit: Payer: Self-pay

## 2021-05-25 DIAGNOSIS — Z7901 Long term (current) use of anticoagulants: Secondary | ICD-10-CM | POA: Diagnosis not present

## 2021-05-25 DIAGNOSIS — Z5181 Encounter for therapeutic drug level monitoring: Secondary | ICD-10-CM | POA: Diagnosis not present

## 2021-05-25 LAB — POCT INR: INR: 1.4 — AB (ref 2.0–3.0)

## 2021-05-25 NOTE — Patient Instructions (Signed)
Description   Take 2 tablets today and 2 tablets tomorrow then START taking warfarin 1.5 tablets daily.   Recheck INR in 1 week. Be consistent with Ensure (3x week). Coumadin Clinic. [phone].   Bill [phone]?

## 2021-05-28 ENCOUNTER — Encounter: Payer: Self-pay | Admitting: Neurology

## 2021-05-28 ENCOUNTER — Other Ambulatory Visit: Payer: Self-pay

## 2021-05-28 ENCOUNTER — Ambulatory Visit: Payer: Medicaid Other | Admitting: Neurology

## 2021-05-28 ENCOUNTER — Other Ambulatory Visit: Payer: Self-pay | Admitting: Internal Medicine

## 2021-05-28 VITALS — BP 150/89 | HR 103 | Ht 62.0 in | Wt 115.0 lb

## 2021-05-28 DIAGNOSIS — E519 Thiamine deficiency, unspecified: Secondary | ICD-10-CM

## 2021-05-28 DIAGNOSIS — G621 Alcoholic polyneuropathy: Secondary | ICD-10-CM | POA: Diagnosis not present

## 2021-05-28 DIAGNOSIS — Q845 Enlarged and hypertrophic nails: Secondary | ICD-10-CM

## 2021-05-28 DIAGNOSIS — G629 Polyneuropathy, unspecified: Secondary | ICD-10-CM

## 2021-05-28 MED FILL — Pantoprazole Sodium EC Tab 40 MG (Base Equiv): ORAL | 15 days supply | Qty: 30 | Fill #3 | Status: AC

## 2021-05-28 NOTE — Patient Instructions (Addendum)
Refer to podiatry   Check your feet daily  Start to use walker   Continue home exercises for balance  Continue vitamin B12 and vitamin B1 supplements  Return to clinic as needed

## 2021-05-28 NOTE — Progress Notes (Signed)
[facility] Neurology Division Clinic Note - Initial Visit   Date: 05/28/21  AVIANNA KAY MRN: [id number] DOB: 01-Jun-1961   Dear Dr. Laural Benes:  Thank you for your kind referral of Jessa ZAMARI LUPIEN for consultation of neuropathy. Although her history is well known to you, please allow [place] to reiterate it for the purpose of our medical record. The patient was accompanied to the clinic by self.  History of Present Illness: Mary [name] is a 60 y.o. right-handed female with severe MR s/p MR, TVR, carotid stenosis, SVT, CHF, alcohol abuse presenting for evaluation of neuropathy.  Starting around March/April 2021, she began having tingling in the feet, lower legs, and hands.  Presented to [facility] in November 2021 with progressive weakness, imbalance, vision changes, tingling of the lips, and generalized weakness.  CT head showed atrophy, MRI showed some microhemorhhages in a non specific pattern. Nutritional and autoimmune labs notable for thiamine deficiency.. LP with CSF studies with no CSF pleocytosis, no elevated CSF protein and profile is not suggestive of GBS.  She was treated with thiamine and discharged home with PT since she did not have benefit to go to SNF.  She completed PT and currently uses a walker at home and a cane when out.  She endorses imbalance, weakness in the arms, legs, and numbness/tingling from her knees down and hands.  She admits to being an alcoholic and has been sober since November 2021.    Out-side paper records, electronic medical record, and images have been reviewed where available and summarized as:  MRI brain wo contrast 08/18/2020: 1. No acute intracranial abnormality. 2. Fewer than 5 scattered microhemorrhages in a nonspecific pattern.  Lab Results  Component Value Date   HGBA1C 4.2 (L) 08/18/2020   Lab Results  Component Value Date   VITAMINB12 1,311 (H) 11/24/2020   Lab Results  Component Value Date   TSH 1.660 08/28/2020   Lab  Results  Component Value Date   ESRSEDRATE 14 08/18/2020    Past Medical History:  Diagnosis Date   Abnormal liver function    Acute diastolic heart failure (HCC) 03/15/2013   Alcoholism (HCC)    Anemia    B12 deficiency    Carotid stenosis    Carotid US 2/17: bilateral ICA 1-39% >> FU prn   Childhood asthma    Chronic diastolic CHF (congestive heart failure) (HCC)    Complete heart block (HCC)    Epigastric hernia 200's   GERD (gastroesophageal reflux disease)    History of blood transfusion    "14 w/1st pregnancy; 2 w/last C-section" (03/15/2013)   Hx of echocardiogram    a. Echo 4/16:  Mild focal basal septal hypertrophy, EF 60-65%, no RWMA, Mechanical MVR ok with mild central regurgitation and no perivalvular leak, small mobile density attached to valvular ring (1.5x1 cm) - post surgical changes vs SBE, mild LAE;   b. 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   33mm Sorin Carbomedics mechanical prosthesis via right mini thoracotomy approach   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   Complex valvuloplasty including Cor-matrix ECM patch augmentation of anterior and lateral leaflets with 26mm Edwards mc3 ring annuloplasty via  right mini-thoracotomy approach   Severe mitral regurgitation 04/22/2013   Sinus tachycardia    SVT (supraventricular tachycardia) (HCC) 03/16/2013   TIA (transient ischemic attack)    Tricuspid regurgitation     Past Surgical History:  Procedure Laterality Date   APPENDECTOMY  ~1978   BALLOON DILATION N/A 11/27/2020   Procedure: BALLOON DILATION;  Surgeon: Iva Boop, MD;  Location: [facility] ENDOSCOPY;  Service: Endoscopy;  Laterality: N/A;   BIOPSY  05/01/2020   Procedure: BIOPSY;   Surgeon: Rachael Fee, MD;  Location: [facility] ENDOSCOPY;  Service: Endoscopy;;   CARDIAC CATHETERIZATION     CARDIAC VALVE REPLACEMENT     CESAREAN SECTION  1983   CESAREAN SECTION WITH BILATERAL TUBAL LIGATION  1999   COLONOSCOPY WITH PROPOFOL N/A 05/01/2020   Procedure: COLONOSCOPY WITH PROPOFOL;  Surgeon: Rachael Fee, MD;  Location: [facility] ENDOSCOPY;  Service: Endoscopy;  Laterality: N/A;   ESOPHAGOGASTRODUODENOSCOPY (EGD) WITH PROPOFOL N/A 05/01/2020   Procedure: ESOPHAGOGASTRODUODENOSCOPY (EGD) WITH PROPOFOL;  Surgeon: Rachael Fee, MD;  Location: [facility] ENDOSCOPY;  Service: Endoscopy;  Laterality: N/A;   ESOPHAGOGASTRODUODENOSCOPY (EGD) WITH PROPOFOL N/A 11/27/2020   Procedure: ESOPHAGOGASTRODUODENOSCOPY (EGD) WITH PROPOFOL;  Surgeon: Iva Boop, MD;  Location: [facility] ENDOSCOPY;  Service: Endoscopy;  Laterality: N/A;   FEMORAL HERNIA REPAIR Right 05/23/2013   Procedure: HERNIA REPAIR FEMORAL;  Surgeon: Purcell Nails, MD;  Location: [facility] OR;  Service: Open Heart Surgery;  Laterality: Right;   INTRAOPERATIVE TRANSESOPHAGEAL ECHOCARDIOGRAM N/A 05/23/2013   Procedure: INTRAOPERATIVE TRANSESOPHAGEAL ECHOCARDIOGRAM;  Surgeon: Purcell Nails, MD;  Location: [facility] OR;  Service: Open Heart Surgery;  Laterality: N/A;   LAPAROSCOPIC CHOLECYSTECTOMY  2003   LEFT AND RIGHT HEART CATHETERIZATION WITH CORONARY ANGIOGRAM N/A 03/22/2013   Procedure: LEFT AND RIGHT HEART CATHETERIZATION WITH CORONARY ANGIOGRAM;  Surgeon: Kathleene Hazel, MD;  Location: [facility] CATH LAB;  Service: Cardiovascular;  Laterality: N/A;   MINIMALLY INVASIVE TRICUSPID VALVE REPAIR Right 05/23/2013   Procedure: MINIMALLY INVASIVE TRICUSPID VALVE REPAIR;  Surgeon: Purcell Nails, MD;  Location: [facility] OR;  Service: Open Heart Surgery;  Laterality: Right;   MITRAL VALVE REPLACEMENT N/A 05/23/2013   Procedure: MITRAL VALVE (MV) REPLACEMENT;  Surgeon: Purcell Nails, MD;  Location: [facility] OR;  Service: Open Heart Surgery;  Laterality: N/A;   MULTIPLE  EXTRACTIONS WITH ALVEOLOPLASTY N/A 04/04/2013   Procedure: Extraction of tooth #'s 1,8,9,13,14,15,23,24,25,26 with alveoloplasty and gross debridement of remaining teeth;  Surgeon: Charlynne Pander, DDS;  Location: [facility] OR;  Service: Oral Surgery;  Laterality: N/A;   TEE WITHOUT CARDIOVERSION N/A 03/18/2013   Procedure: TRANSESOPHAGEAL ECHOCARDIOGRAM (TEE);  Surgeon: Laurey Morale, MD;  Location: [facility] ENDOSCOPY;  Service: Cardiovascular;  Laterality: N/A;   TEE WITHOUT CARDIOVERSION N/A 06/17/2013   Procedure: TRANSESOPHAGEAL ECHOCARDIOGRAM (TEE);  Surgeon: Lewayne Bunting, MD;  Location: [facility] ENDOSCOPY;  Service: Cardiovascular;  Laterality: N/A;   TUBAL LIGATION  1999     Medications:  Outpatient Encounter Medications as of 05/28/2021  Medication Sig   folic acid (FOLVITE) 1 MG tablet TAKE 1 TABLET (1 MG TOTAL) BY MOUTH DAILY.   furosemide (LASIX) 20 MG tablet TAKE 1 TABLET (20 MG TOTAL) BY MOUTH DAILY. (Patient taking differently: Take 20 mg by mouth daily as needed for fluid (takes with Potassium ).)   gabapentin (NEURONTIN) 100 MG capsule Take 1 capsule (100 mg total) by mouth 2 (two) times daily. And continue 300 mg at bedtime (Patient taking differently: Take 100 mg by mouth once. And continue  300 mg twice daily)   gabapentin (NEURONTIN) 300 MG capsule Take 1 capsule (300 mg total) by mouth 2 (two) times daily.   Naphazoline HCl (CLEAR EYES OP) Place 1 drop into both eyes continuous as needed (dry/irritated eyes).   pantoprazole (PROTONIX) 40 MG tablet TAKE 1 TABLET (40 MG TOTAL) BY MOUTH 2 (TWO) TIMES DAILY. (Patient taking differently: Take 40 mg by mouth 2 (two) times daily.)   potassium chloride (KLOR-CON) 10 MEQ tablet TAKE 1 TABLET (10 MEQ TOTAL) BY MOUTH DAILY. TO TAKE WITH FLUID PILL (Patient taking differently: Take 10 mEq by mouth daily as needed (swelling. Takes with Furosemide.).)   traMADol (ULTRAM) 50 MG tablet 1 tab PO 1-2 times a day PRN pain   vitamin B-12 (CYANOCOBALAMIN)  500 MCG tablet TAKE 500 MCG BY MOUTH DAILY.   warfarin (COUMADIN) 5 MG tablet Take 1 to 1&1/2 tablets by mouth daily as directed by the coumadin clinic   [DISCONTINUED] potassium chloride (KLOR-CON) 10 MEQ tablet Take 1 tablet (10 mEq total) by mouth daily for 30 doses.   [DISCONTINUED] thiamine 100 MG tablet Take 1 tablet (100 mg total) by mouth daily. (Patient not taking: Reported on 05/28/2021)   No facility-administered encounter medications on file as of 05/28/2021.    Allergies:  Allergies  Allergen Reactions   Oxycodone Itching    Pt states no longer makes her itchy   Tape Rash and Other (See Comments)    THE ONLY TAPE THAT IS TOLERATED IS PAPER TAPE. EKG leads inflame the skin    Family History: Family History  Problem Relation Age of Onset   Kidney disease Mother        Had one Kidney removed   Stroke Father    Cancer Father    Sarcoidosis Sister    Heart attack Neg Hx    Colon cancer Neg Hx    Stomach cancer Neg Hx    Pancreatic cancer Neg Hx    Esophageal cancer Neg Hx     Social History: Social History   Tobacco Use   Smoking status: Former    Packs/day: 0.50    Years: 36.00    Pack years: 18.00    Types: Cigarettes    Quit date: 03/15/2013    Years since quitting: 8.2   Smokeless tobacco: Never  Vaping Use   Vaping Use: Never used  Substance Use Topics   Alcohol use: Yes    Comment:  "glass of wine"   Drug use: No   Social History   Social History Narrative   Right handed   Two story home apartment     Vital Signs:  BP (!) 150/89   Pulse (!) 103   Ht 5\' 2"  (1.575 m)   Wt 115 lb (52.2 kg)   LMP 03/22/2013   SpO2 96%   BMI 21.03 kg/m    Neurological Exam: MENTAL STATUS including orientation to time, place, person, recent and remote memory, attention span and concentration, language, and fund of knowledge is normal.  Speech is not dysarthric.  CRANIAL NERVES: II:  No visual field defects. .   III-IV-VI: Pupils equal round and reactive to  light.  Normal conjugate, extra-ocular eye movements in all directions of gaze.  No nystagmus.  No ptosis.   V:  Normal facial sensation.    VII:  Normal facial symmetry and movements.   VIII:  Normal hearing and vestibular function.   IX-X:  Normal palatal movement.   XI:  Normal shoulder shrug  and head rotation.   XII:  Normal tongue strength and range of motion, no deviation or fasciculation.  MOTOR:  Moderate atrophy of the hands and lower legs.  No fasciculations or abnormal movements.  No pronator drift.  Motor strength is 4/5 proximally and 3+ to 4 in the hands and feet.  MSRs:  Right        Left                  brachioradialis 2+  2+  biceps 2+  2+  triceps 2+  2+  patellar 2+  2+  ankle jerk 0  0  Hoffman no  no  plantar response down  down   SENSORY:  Vibration absent at the great toe, reduced at the ankles and knees.  Intact at the MCP.  Temperature and pin prick reduced in the hands and from the knees down into the feet.  Rhomberg sign is positive.   COORDINATION/GAIT: Dysmetria with finger-to- nose-finger testing. Slowed finger tapping due to weakness.  Gait is wide-based, mild steppage bilaterally, assisted with a cane, appears unsteady at times.   IMPRESSION: Alcohol-induced neuropathy, severe, involving a stocking glove distrubution.  Symptoms also contributed by thiamine deficiency.  I had extensive discussion with the patient regarding the pathogenesis, etiology, management, and natural course of neuropathy. Alcohol is the reason for her neuropathy and the fact that she is no longer consuming alcohol will prevent progression, but there is no treatment to cure or reverse what she already has.  Management is supportive.   PLAN/RECOMMENDATIONS:  NCS/EMG was declined by patient Continue gabapentin 300mg  BID Always use a walker Continue home PT exercises Patient educated on daily foot inspection, fall prevention, and safety precautions around the home. Patient  requesting referral to podiatry to assist with toe nail hygiene  Return to clinic as needed  Total time spent reviewing records, interview, history/exam, documentation, and coordination of care on day of encounter:  45 min    Thank you for allowing me to participate in patient's care.  If I can answer any additional questions, I would be pleased to do so.    Sincerely,    Alonnie Bieker K. Allena Katz, DO

## 2021-06-01 ENCOUNTER — Ambulatory Visit (INDEPENDENT_AMBULATORY_CARE_PROVIDER_SITE_OTHER): Payer: Medicaid Other

## 2021-06-01 ENCOUNTER — Other Ambulatory Visit: Payer: Self-pay

## 2021-06-01 DIAGNOSIS — Z7901 Long term (current) use of anticoagulants: Secondary | ICD-10-CM | POA: Diagnosis not present

## 2021-06-01 DIAGNOSIS — Z5181 Encounter for therapeutic drug level monitoring: Secondary | ICD-10-CM

## 2021-06-01 LAB — POCT INR: INR: 4.4 — AB (ref 2.0–3.0)

## 2021-06-01 MED ORDER — FOLIC ACID 1 MG PO TABS
ORAL_TABLET | Freq: Every day | ORAL | 0 refills | Status: DC
  Filled 2021-06-01: qty 30, 30d supply, fill #0

## 2021-06-01 NOTE — Patient Instructions (Signed)
Description   Hold today's today and then continue taking warfarin 1.5 tablets daily.   Recheck INR in 1 week. Be consistent with Ensure (3x week). Coumadin Clinic. [phone].   Bill [phone]?

## 2021-06-07 ENCOUNTER — Ambulatory Visit (INDEPENDENT_AMBULATORY_CARE_PROVIDER_SITE_OTHER): Payer: Medicaid Other

## 2021-06-07 ENCOUNTER — Other Ambulatory Visit: Payer: Self-pay

## 2021-06-07 DIAGNOSIS — Z7901 Long term (current) use of anticoagulants: Secondary | ICD-10-CM

## 2021-06-07 DIAGNOSIS — Z5181 Encounter for therapeutic drug level monitoring: Secondary | ICD-10-CM

## 2021-06-07 LAB — POCT INR: INR: 2.1 (ref 2.0–3.0)

## 2021-06-07 NOTE — Patient Instructions (Signed)
Description   Take 2 tablets today and then continue taking warfarin 1.5 tablets daily.   Recheck INR in 1 week. Be consistent with Ensure (3x week). Coumadin Clinic. [phone].   Bill [phone]?

## 2021-06-08 ENCOUNTER — Ambulatory Visit: Payer: Self-pay | Admitting: *Deleted

## 2021-06-08 NOTE — Telephone Encounter (Signed)
Summary: medication management   Patient states she will run out of gabapentin (NEURONTIN) 300 MG capsule by tomorrow night. Pharmacy states refill can not be picked up until 06/12/2021. Patient states she also takes gabapentin (NEURONTIN) 100 MG mid day. Seeking clarity why she is running out so early and will PCP be able to send in a short supply to hold her over until 06/12/2021.      Reason for Disposition  [1] Caller has URGENT medicine question about med that PCP or specialist prescribed AND [2] triager unable to answer question  Answer Assessment - Initial Assessment Questions 1. NAME of MEDICATION: "What medicine are you calling about?"     Neurontin 300 mg 2. QUESTION: "What is your question?" (e.g., double dose of medicine, side effect)     Patient states the pharmacy will not fill until 9/17 3. PRESCRIBING HCP: "Who prescribed it?" Reason: if prescribed by specialist, call should be referred to that group.     PCP Call to pharmacy to see if they can explain the reason patient is having problem filling her medication. The pharmacist states that the insurance is refusing 2 different doses. He is going to send a prior authorization to the office- he thinks that may fix the problem. Attempted to call patient- notified of pharmacy call- advised to follow up with office tomorrow.  Protocols used: Medication Question Call-A-AH

## 2021-06-09 ENCOUNTER — Telehealth: Payer: Self-pay | Admitting: Pharmacist

## 2021-06-09 NOTE — Telephone Encounter (Signed)
Since the refill is early, she will have to wait until insurance will cover the rx (9/17). Please clarify how many 300 mg capsules the patient is taking per day. The rx is for #60 with the sig of 1 capsule BID which should be a 30-day supply. If taken correctly, she should not be running out this early.

## 2021-06-14 ENCOUNTER — Ambulatory Visit (INDEPENDENT_AMBULATORY_CARE_PROVIDER_SITE_OTHER): Payer: Medicaid Other

## 2021-06-14 ENCOUNTER — Other Ambulatory Visit: Payer: Self-pay

## 2021-06-14 DIAGNOSIS — Z5181 Encounter for therapeutic drug level monitoring: Secondary | ICD-10-CM | POA: Diagnosis not present

## 2021-06-14 DIAGNOSIS — Z7901 Long term (current) use of anticoagulants: Secondary | ICD-10-CM

## 2021-06-14 LAB — POCT INR: INR: 2.4 (ref 2.0–3.0)

## 2021-06-14 MED FILL — Pantoprazole Sodium EC Tab 40 MG (Base Equiv): ORAL | 15 days supply | Qty: 30 | Fill #4 | Status: AC

## 2021-06-14 NOTE — Patient Instructions (Addendum)
Description   Take 2 tablets today and then continue taking warfarin 1.5 tablets daily.   Recheck INR in 2 weeks. Be consistent with Ensure (1x week). Coumadin Clinic. [phone].   Bill [phone]?

## 2021-06-18 ENCOUNTER — Ambulatory Visit: Payer: Self-pay

## 2021-06-18 NOTE — Telephone Encounter (Signed)
Pt called stating that she is not sure how much of her B12 and B1 vitamins. Please advise  Pt. Reports she was told by her PCP to take "Vit B 1 - thiamine, but I can't find it anywhere." Asking if she should take something else. Please advise.   Answer Assessment - Initial Assessment Questions 1. NAME of MEDICATION: "What medicine are you calling about?"     Vitamin B 1 - thiamine 2. QUESTION: "What is your question?" (e.g., double dose of medicine, side effect)     Pt. Cannot find OTC 3. PRESCRIBING HCP: "Who prescribed it?" Reason: if prescribed by specialist, call should be referred to that group.     Dr. Wynetta Emery 4. SYMPTOMS: "Do you have any symptoms?"     N/a 5. SEVERITY: If symptoms are present, ask "Are they mild, moderate or severe?"     N/a 6. PREGNANCY:  "Is there any chance that you are pregnant?" "When was your last menstrual period?"     No  Protocols used: Medication Question Call-A-AH

## 2021-06-18 NOTE — Progress Notes (Signed)
Cardiology Office Note:    Date:  06/22/2021   ID:  Mary [name], DOB 10-10-1960, MRN [id number]  PCP:  [name], MD  [facility] Cardiologist:  Candee Furbish, MD  [facility] Electrophysiologist:  Mary Peru, MD   Referring MD: [name], MD     History of Present Illness:    Mary [name] is a 60 y.o. female here for a follow up for CHF and s/p  mechanical value replacement. She used to be seen by Dr. Saunders Revel and prior to that Dr. Aundra Dubin.  A implantable loop recorder was recommended but insurance would cover.  She did have some dark stools and was recommended to go to the emergency room after I saw her in follow-up in April 2021. The patient declined at that time. Her hemoglobin was 10.6 at that time as well.  Pt was seen by gastroenterology and lovenox bridge for any endoscopies were needed.  Today, she has been feeling fine since last visit. She reports her experience has been a battle but a good battle for her. She denied a aid to help her in her home but reports she cooks that takes a little longer than usual and she cleans the bathroom.  She includes her sister asked "what's that noise?" while them being in the bathroom and pt told her "its the clock" but revealed later to her sister that it was her heart.   I love her saying "As long as its clicking, I'm kicking!"    She denies any chest pains, chest tightness, chest pressure, palpitations, shortness of breath, [name] edema, dizziness, fatigue, lightheadedness, NVD or syncope.   Past Medical History:  Diagnosis Date   Abnormal liver function    Acute diastolic heart failure ([place]) 03/15/2013   Alcoholism ([place])    Anemia    B12 deficiency    Carotid stenosis    Carotid US 2/17: bilateral ICA 1-39% >> FU prn   Childhood asthma    Chronic diastolic CHF (congestive heart failure) (HCC)    Complete heart block (HCC)    Epigastric hernia 200's   GERD (gastroesophageal reflux disease)    History of  blood transfusion    "14 w/1st pregnancy; 2 w/last C-section" (03/15/2013)   Hx of echocardiogram    a. Echo 4/16:  Mild focal basal septal hypertrophy, EF 60-65%, no RWMA, Mechanical MVR ok with mild central regurgitation and no perivalvular leak, small mobile density attached to valvular ring (1.5x1 cm) - post surgical changes vs SBE, mild LAE;   b. 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   S/P mitral valve replacement with metallic valve 04/07/4579   36mm Sorin Carbomedics mechanical prosthesis via right mini thoracotomy approach   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   Complex valvuloplasty including Cor-matrix ECM patch augmentation of anterior and lateral leaflets with 42mm Edwards mc3 ring annuloplasty via right mini-thoracotomy approach   Severe mitral regurgitation 04/22/2013   Sinus tachycardia    SVT (supraventricular tachycardia) ([place]) 03/16/2013   TIA (transient ischemic attack)    Tricuspid regurgitation     Past Surgical History:  Procedure Laterality Date   APPENDECTOMY  ~1978   BALLOON DILATION N/A 11/27/2020   Procedure: BALLOON DILATION;  Surgeon: Gatha Mayer, MD;  Location: [facility] ENDOSCOPY;  Service: Endoscopy;  Laterality: N/A;   BIOPSY  05/01/2020   Procedure: BIOPSY;  Surgeon: Milus Banister, MD;  Location: [facility] END[facility];  Service: Endoscopy;;   [place] WITH BILATERAL TUBAL LIGATION  1999   COLONOSCOPY WITH PROPOFOL N/A 05/01/2020   Procedure: COLONOSCOPY WITH PROPOFOL;  Surgeon: Milus Banister, MD;  Location: [facility] ENDOSCOPY;  Service: Endoscopy;  Laterality: N/A;   ESOPHAGOGASTRODUODENOSCOPY (EGD) WITH PROPOFOL N/A 05/01/2020   Procedure: ESOPHAGOGASTRODUODENOSCOPY  (EGD) WITH PROPOFOL;  Surgeon: Milus Banister, MD;  Location: [facility] ENDOSCOPY;  Service: Endoscopy;  Laterality: N/A;   ESOPHAGOGASTRODUODENOSCOPY (EGD) WITH PROPOFOL N/A 11/27/2020   Procedure: ESOPHAGOGASTRODUODENOSCOPY (EGD) WITH PROPOFOL;  Surgeon: Gatha Mayer, MD;  Location: [place];  Service: Endoscopy;  Laterality: N/A;   FEMORAL HERNIA REPAIR Right 05/23/2013   Procedure: HERNIA REPAIR FEMORAL;  Surgeon: Rexene Alberts, MD;  Location: [place];  Service: Open Heart Surgery;  Laterality: Right;   INTRAOPERATIVE TRANSESOPHAGEAL ECHOCARDIOGRAM N/A 05/23/2013   Procedure: INTRAOPERATIVE TRANSESOPHAGEAL ECHOCARDIOGRAM;  Surgeon: Rexene Alberts, MD;  Location: [place];  Service: Open Heart Surgery;  Laterality: N/A;   LAPAROSCOPIC CHOLECYSTECTOMY  2003   LEFT AND RIGHT HEART CATHETERIZATION WITH CORONARY ANGIOGRAM N/A 03/22/2013   Procedure: LEFT AND RIGHT HEART CATHETERIZATION WITH CORONARY ANGIOGRAM;  Surgeon: Burnell Blanks, MD;  Location: [facility] CATH LAB;  Service: Cardiovascular;  Laterality: N/A;   MINIMALLY INVASIVE TRICUSPID VALVE REPAIR Right 05/23/2013   Procedure: MINIMALLY INVASIVE TRICUSPID VALVE REPAIR;  Surgeon: Rexene Alberts, MD;  Location: [place];  Service: Open Heart Surgery;  Laterality: Right;   MITRAL VALVE REPLACEMENT N/A 05/23/2013   Procedure: MITRAL VALVE (MV) REPLACEMENT;  Surgeon: Rexene Alberts, MD;  Location: [place];  Service: Open Heart Surgery;  Laterality: N/A;   MULTIPLE EXTRACTIONS WITH ALVEOLOPLASTY N/A 04/04/2013   Procedure: Extraction of tooth #'s 1,8,9,13,14,15,23,24,25,26 with alveoloplasty and gross debridement of remaining teeth;  Surgeon: Lenn Cal, DDS;  Location: [place];  Service: Oral Surgery;  Laterality: N/A;   TEE WITHOUT CARDIOVERSION N/A 03/18/2013   Procedure: TRANSESOPHAGEAL ECHOCARDIOGRAM (TEE);  Surgeon: Larey Dresser, MD;  Location: [place];  Service: Cardiovascular;  Laterality: N/A;   TEE WITHOUT CARDIOVERSION N/A 06/17/2013    Procedure: TRANSESOPHAGEAL ECHOCARDIOGRAM (TEE);  Surgeon: Lelon Perla, MD;  Location: [facility] ENDOSCOPY;  Service: Cardiovascular;  Laterality: N/A;   TUBAL LIGATION  1999    Current Medications: Current Meds  Medication Sig   folic acid (FOLVITE) 1 MG tablet TAKE 1 TABLET (1 MG TOTAL) BY MOUTH DAILY.   furosemide (LASIX) 20 MG tablet TAKE 1 TABLET (20 MG TOTAL) BY MOUTH DAILY.   gabapentin (NEURONTIN) 100 MG capsule Take 1 capsule (100 mg total) by mouth 2 (two) times daily. And continue 300 mg at bedtime   gabapentin (NEURONTIN) 300 MG capsule Take 1 capsule (300 mg total) by mouth 2 (two) times daily.   Naphazoline HCl (CLEAR EYES OP) Place 1 drop into both eyes continuous as needed (dry/irritated eyes).   pantoprazole (PROTONIX) 40 MG tablet TAKE 1 TABLET (40 MG TOTAL) BY MOUTH 2 (TWO) TIMES DAILY.   potassium chloride (KLOR-CON) 10 MEQ tablet TAKE 1 TABLET (10 MEQ TOTAL) BY MOUTH DAILY. TO TAKE WITH FLUID PILL   traMADol (ULTRAM) 50 MG tablet 1 tab PO 1-2 times a day PRN  pain   vitamin B-12 (CYANOCOBALAMIN) 500 MCG tablet TAKE 500 MCG BY MOUTH DAILY.   warfarin (COUMADIN) 5 MG tablet Take 1 to 1&1/2 tablets by mouth daily as directed by the coumadin clinic     Allergies:   Oxycodone and Tape   Social History   Socioeconomic History   Marital status: Single    Spouse name: Not on file   Number of children: Not on file   Years of education: Not on file   Highest education level: Not on file  Occupational History   Not on file  Tobacco Use   Smoking status: Former    Packs/day: 0.50    Years: 36.00    Pack years: 18.00    Types: Cigarettes    Quit date: 03/15/2013    Years since quitting: 8.2   Smokeless tobacco: Never  Vaping Use   Vaping Use: Never used  Substance and Sexual Activity   Alcohol use: Yes    Comment:  "glass of wine"   Drug use: No   Sexual activity: Never  Other Topics Concern   Not on file  Social History Narrative   Right handed   Two story  home apartment    Social Determinants of Health   Financial Resource Strain: Not on file  Food Insecurity: Not on file  Transportation Needs: Not on file  Physical Activity: Not on file  Stress: Not on file  Social Connections: Not on file     Family History: The patient's family history includes Cancer in her father; Kidney disease in her mother; Sarcoidosis in her sister; Stroke in her father. There is no history of Heart attack, Colon cancer, Stomach cancer, Pancreatic cancer, or Esophageal cancer.  ROS:   Please see the history of present illness.    All other systems reviewed and are negative.  EKGs/Labs/Other Studies Reviewed:    The following studies were reviewed today: Severe MR (in setting of SBE) June 2014 S/p R mini-thoracotomy with mechanical MVR and TV repair 6/14 Post op c/b CHB >> resolved; no pacemaker needed Echocardiogram 02/2019: normal EF, normally functioning MVR and TV repair Echo 12/2019: EF 55-60, stable MVR, TV repair Transient Complete Heart Block Holter 03/2014: asymptomatic CHB during sleep >> seen by EP Mary [name]); no PPM needed Holter 7/19: normal sinus rhythm, intermittent CHB during daytime >> seen by EP; PPM recommended; patient never called back to arrange Last seen by Dr. Lovena [name] 11/2019: no pacer indicated  Hx of syncope 03/2019; ILR recommended; no insurance coverage S/p TIA in 05/2016 (sub therapeutic INR) Sinus tachycardia Beta blocker tried in 5/17 but stopped due to high grade heart block >> seen by EP; no PPM needed Cardiac Catheterization 2014:  No CAD Macrocytic anemia 2/2 B12 deficiency   Prior CV studies: Echocardiogram 12/31/2019 EF 55-60, no R WMA, normal RV SF, mitral valve replacement without evidence of regurgitation, tricuspid valve repair stable   Echocardiogram 03/06/2019 EF > 65, no RWMA, s/p mechanical MVR with normal function, s/p TV repair with trivial TR   Holter 03/30/18 Normal sinus rhythm and sinus tachycardia with average  heart rate 109 bpm. Heart rate ranged from 27 to 171 bpm. Intermittent complete heart block with slow ventricular response at 47 bpm during the daytime. SVT at 171 bpm Occasional PVCs and wide-complex tachycardia up to 5 beats   Echocardiogram 02/06/18 EF 55-60, no RWMA, normally functioning mechanical MVR, TV repair functioning normally   Event Monitor 02/24/16 1 episode of high grade heart block at  night.  Otherwise, mild sinus tachycardia.     Carotid US 9/17 Bilateral: soft plaque throughout CCA, ICA and ECA. 1-39% ICA plaquing. Vertebral artery flow is antegrade.   Echo 06/06/16 EF 55-60%, mechanical MVR functioning normally, mild RAE, TV repair functioning normally   Echo 11/03/15 Posterior wall mild LVH, EF 55-60%, normal wall motion, mechanical MVR okay, normal RVSF   Echo 01/01/15 Mild focal basal septal hypertrophy, EF 60-65%, normal wall motion, S/P MVR (St Jude mechanical valve) functioning well, mild MR (small mobile echodensity-likely redundant chord versus suture), mild LAE, TV mean gradient 4 mmHg.    Echo 7/15 Mild LVH, EF 60-65%, normal wall motion, grade 1 diastolic dysfunction, S/P MVR (St Jude mechanical valve) and appears functioning well, mild MR, no perivalvular leak, mild LAE, mildly reduced RVSF.    Carotid US 8/14 Bilateral - 1% to 39% ICA stenosis    Cardiac cath 6/14 Left main: No obstructive disease.  Left Anterior Descending Artery:  No obstructive disease noted.   Circumflex Artery:   No obstructive disease.   Right Coronary Artery:  no obstructive disease.  Left Ventricular Angiogram: LVEF 65-70%. 3+ MR Distal Aortogram: No aneurysm. No stenosis.   EKG:   05/2021: no EKG was ordered today. 03/2020:sinus tachycardia rate 115 bpm  Recent Labs: 08/28/2020: TSH 1.660 12/03/2020: Magnesium 1.4 01/30/2021: ALT 18; B Natriuretic Peptide 137.4; BUN 9; Creatinine, Ser 1.17; Hemoglobin 12.8; Platelets 318; Potassium 3.1; Sodium 139  Recent Lipid Panel     Component Value Date/Time   CHOL 189 05/31/2018 1108   TRIG 300 (H) 05/31/2018 1108   HDL 68 05/31/2018 1108   CHOLHDL 2.8 05/31/2018 1108   CHOLHDL 3.2 06/06/2016 0135   VLDL 22 06/06/2016 0135   LDLCALC 61 05/31/2018 1108    Physical Exam:    VS:  BP 110/62 (BP Location: Left Arm, Patient Position: Sitting, Cuff Size: Normal)   Pulse (!) 102   Ht 5\' 2"  (1.575 m)   Wt 116 lb (52.6 kg)   LMP 03/22/2013   SpO2 98%   BMI 21.22 kg/m     Wt Readings from Last 3 Encounters:  06/22/21 116 lb (52.6 kg)  05/28/21 115 lb (52.2 kg)  01/30/21 119 lb 0.8 oz (54 kg)     GEN: Well nourished, well developed in no acute distress HEENT: Normal NECK: No JVD; No carotid bruits LYMPHATICS: No lymphadenopathy CARDIAC: Mechanical S1 RRR, no murmurs, rubs, gallops RESPIRATORY:  Clear to auscultation without rales, wheezing or rhonchi, mildly increased respiratory rate ABDOMEN: Soft, non-tender, non-distended MUSCULOSKELETAL:  No edema; No deformity  SKIN: Warm and dry NEUROLOGIC:  Alert and oriented x 3 PSYCHIATRIC:  Normal affect   ASSESSMENT:    1. S/P mitral valve replacement with metallic valve   2. Intermittent complete heart block ([place])   3. Warfarin anticoagulation     PLAN:   S/P mitral valve replacement with metallic valve Mechanical mitral valve.  Last INR therapeutic.  Followed closely by Coumadin clinic.  No bleeding.  Echocardiogram 2021 reassuring.  Personally reviewed.  Intermittent complete heart block ([place]) Has been seen in the past by electrophysiology, Dr. Lovena [name] who did not feel that pacemaker was necessary in the situation.  Avoiding AV nodal blocking agents such as metoprolol.  Pulse today excellent at 102.  Prior EKG reviewed reassuring.  Warfarin anticoagulation Continue with close monitoring of INR by pharmacy team.  Notes reviewed.  Last hemoglobin 12.8, creatinine 1.1.  Excellent.  FOLLOW UP IN 1 YEAR  Medication  Adjustments/Labs and Tests  Ordered: Current medicines are reviewed at length with the patient today.  Concerns regarding medicines are outlined above.  No orders of the defined types were placed in this encounter.  No orders of the defined types were placed in this encounter.   Patient Instructions  Medication Instructions:  The current medical regimen is effective;  continue present plan and medications.  *If you need a refill on your cardiac medications before your next appointment, please call your pharmacy*  Follow-Up: At [facility], you and your health needs are our priority.  As part of our continuing mission to provide you with exceptional heart care, we have created designated Provider Care Teams.  These Care Teams include your primary Cardiologist (physician) and Advanced Practice Providers (APPs -  Physician Assistants and Nurse Practitioners) who all work together to provide you with the care you need, when you need it.  We recommend signing up for the patient portal called "MyChart".  Sign up information is provided on this After Visit Summary.  MyChart is used to connect with patients for Virtual Visits (Telemedicine).  Patients are able to view lab/test results, encounter notes, upcoming appointments, etc.  Non-urgent messages can be sent to your provider as well.   To learn more about what you can do with MyChart, go to [email or web address].    Your next appointment:   1 year(s)  The format for your next appointment:   In Person  Provider:   Candee Furbish, MD   Thank you for choosing [place]!!     I,Jada Bradford,acting as a scribe for Candee Furbish, MD.,have documented all relevant documentation on the behalf of Candee Furbish, MD,as directed by  Candee Furbish, MD while in the presence of Candee Furbish, MD.  I, Candee Furbish, MD, have reviewed all documentation for this visit. The documentation on 06/22/21 for the exam, diagnosis, procedures, and orders are all accurate and complete.    Signed, Candee Furbish, MD  06/22/2021 10:25 AM    [facility]

## 2021-06-18 NOTE — Telephone Encounter (Signed)
Called pt unable to reach left message to call back for any questions or concerns.

## 2021-06-22 ENCOUNTER — Ambulatory Visit (INDEPENDENT_AMBULATORY_CARE_PROVIDER_SITE_OTHER): Payer: Medicaid Other | Admitting: Cardiology

## 2021-06-22 ENCOUNTER — Encounter: Payer: Self-pay | Admitting: Cardiology

## 2021-06-22 ENCOUNTER — Other Ambulatory Visit: Payer: Self-pay

## 2021-06-22 DIAGNOSIS — Z7901 Long term (current) use of anticoagulants: Secondary | ICD-10-CM | POA: Diagnosis not present

## 2021-06-22 DIAGNOSIS — Z954 Presence of other heart-valve replacement: Secondary | ICD-10-CM | POA: Diagnosis not present

## 2021-06-22 DIAGNOSIS — I442 Atrioventricular block, complete: Secondary | ICD-10-CM | POA: Diagnosis not present

## 2021-06-22 NOTE — Assessment & Plan Note (Signed)
Has been seen in the past by electrophysiology, Dr. Lovena Le who did not feel that pacemaker was necessary in the situation.  Avoiding AV nodal blocking agents such as metoprolol.  Pulse today excellent at 102.  Prior EKG reviewed reassuring.

## 2021-06-22 NOTE — Patient Instructions (Signed)
Medication Instructions:  The current medical regimen is effective;  continue present plan and medications.  *If you need a refill on your cardiac medications before your next appointment, please call your pharmacy*  Follow-Up: At [facility], you and your health needs are our priority.  As part of our continuing mission to provide you with exceptional heart care, we have created designated Provider Care Teams.  These Care Teams include your primary Cardiologist (physician) and Advanced Practice Providers (APPs -  Physician Assistants and Nurse Practitioners) who all work together to provide you with the care you need, when you need it.  We recommend signing up for the patient portal called "MyChart".  Sign up information is provided on this After Visit Summary.  MyChart is used to connect with patients for Virtual Visits (Telemedicine).  Patients are able to view lab/test results, encounter notes, upcoming appointments, etc.  Non-urgent messages can be sent to your provider as well.   To learn more about what you can do with MyChart, go to [email or web address].    Your next appointment:   1 year(s)  The format for your next appointment:   In Person  Provider:   Mark Skains, MD   Thank you for choosing [facility]!!    

## 2021-06-22 NOTE — Assessment & Plan Note (Signed)
Continue with close monitoring of INR by pharmacy team.  Notes reviewed.  Last hemoglobin 12.8, creatinine 1.1.  Excellent.

## 2021-06-22 NOTE — Assessment & Plan Note (Signed)
Mechanical mitral valve.  Last INR therapeutic.  Followed closely by Coumadin clinic.  No bleeding.  Echocardiogram 2021 reassuring.  Personally reviewed.

## 2021-06-25 ENCOUNTER — Other Ambulatory Visit: Payer: Self-pay

## 2021-06-25 MED FILL — Pantoprazole Sodium EC Tab 40 MG (Base Equiv): ORAL | 15 days supply | Qty: 30 | Fill #5 | Status: AC

## 2021-06-28 ENCOUNTER — Other Ambulatory Visit: Payer: Self-pay

## 2021-06-28 ENCOUNTER — Other Ambulatory Visit: Payer: Self-pay | Admitting: Cardiology

## 2021-06-28 ENCOUNTER — Other Ambulatory Visit: Payer: Self-pay | Admitting: Internal Medicine

## 2021-06-28 DIAGNOSIS — G629 Polyneuropathy, unspecified: Secondary | ICD-10-CM

## 2021-06-28 DIAGNOSIS — Z7901 Long term (current) use of anticoagulants: Secondary | ICD-10-CM

## 2021-06-28 NOTE — Telephone Encounter (Signed)
Prescription refill request received for warfarin Lov: 06/22/21 Marlou Porch)  Next INR check: 06/29/21 Warfarin tablet strength: 5mg   Appropriate dose and refill sent to requested pharmacy.

## 2021-06-29 NOTE — Telephone Encounter (Signed)
Requested medications are due for refill today yes  Requested medications are on the active medication list yes  Last refill 06/01/21  Last visit 05/24/21  Future visit scheduled Today  Notes to clinic Office visit today, please assess.

## 2021-06-30 ENCOUNTER — Ambulatory Visit (INDEPENDENT_AMBULATORY_CARE_PROVIDER_SITE_OTHER): Payer: Medicaid Other | Admitting: *Deleted

## 2021-06-30 ENCOUNTER — Encounter (INDEPENDENT_AMBULATORY_CARE_PROVIDER_SITE_OTHER): Payer: Self-pay

## 2021-06-30 ENCOUNTER — Other Ambulatory Visit: Payer: Self-pay

## 2021-06-30 DIAGNOSIS — Z7901 Long term (current) use of anticoagulants: Secondary | ICD-10-CM

## 2021-06-30 DIAGNOSIS — Z5181 Encounter for therapeutic drug level monitoring: Secondary | ICD-10-CM

## 2021-06-30 LAB — POCT INR: INR: 3 (ref 2.0–3.0)

## 2021-06-30 MED ORDER — FOLIC ACID 1 MG PO TABS
ORAL_TABLET | Freq: Every day | ORAL | 2 refills | Status: DC
Start: 2021-06-30 — End: 2021-11-02
  Filled 2021-06-30: qty 30, 30d supply, fill #0
  Filled 2021-08-04: qty 30, 30d supply, fill #1

## 2021-06-30 NOTE — Patient Instructions (Signed)
Description   Continue taking warfarin 1.5 tablets daily.   Recheck INR in 3 weeks. Be consistent with Ensure (1x week). Coumadin Clinic. [phone].   Bill [phone]?

## 2021-07-05 ENCOUNTER — Other Ambulatory Visit: Payer: Self-pay

## 2021-07-10 ENCOUNTER — Other Ambulatory Visit: Payer: Self-pay | Admitting: Cardiology

## 2021-07-10 DIAGNOSIS — Z7901 Long term (current) use of anticoagulants: Secondary | ICD-10-CM

## 2021-07-12 NOTE — Telephone Encounter (Signed)
Warfarin refill sent on 06/28/2021; will need to call pharmacy to see if received. DO NOT SEND UNTIL CONFIRMED, PT IS NONCOMPLIANT.

## 2021-07-12 NOTE — Telephone Encounter (Signed)
Spoke with pharmacy & they confirmed they received the Warfarin refill. She will remove this one since pt will have to be compliant with appts before sending in another one. Her appt is on 07/21/21 & will evaluate at that time & note will be placed on appt.

## 2021-07-16 ENCOUNTER — Other Ambulatory Visit: Payer: Self-pay

## 2021-07-16 MED FILL — Pantoprazole Sodium EC Tab 40 MG (Base Equiv): ORAL | 15 days supply | Qty: 30 | Fill #6 | Status: CN

## 2021-07-23 ENCOUNTER — Other Ambulatory Visit: Payer: Self-pay | Admitting: Internal Medicine

## 2021-07-23 DIAGNOSIS — G621 Alcoholic polyneuropathy: Secondary | ICD-10-CM

## 2021-07-24 NOTE — Telephone Encounter (Signed)
Requested medication (s) are due for refill today:yes  Requested medication (s) are on the active medication list:yes  Last refill: 05/24/21  #50  0 refills  Future visit scheduled  no  Notes to clinic: not delegated  Requested Prescriptions  Pending Prescriptions Disp Refills   traMADol (ULTRAM) 50 MG tablet [Pharmacy Med Name: traMADol HCl 50 MG Oral Tablet] 50 tablet 0    Sig: TAKE 1 TABLET BY MOUTH 1 TO 2 TIMES DAILY AS NEEDED FOR PAIN     Not Delegated - Analgesics:  Opioid Agonists Failed - 07/23/2021 12:28 PM      Failed - This refill cannot be delegated      Passed - Urine Drug Screen completed in last 360 days      Passed - Valid encounter within last 6 months    Recent Outpatient Visits           2 months ago Vitamin B12 deficiency   [place], Deborah B, MD   7 months ago Hematemesis of unknown cause   [place], MD   9 months ago Encounter for medication review   [place], Jarome Matin, RPH-CPP   10 months ago Alcoholic peripheral neuropathy [facility])   [place] [name], MD   11 months ago [place], [place], [place]

## 2021-07-26 ENCOUNTER — Other Ambulatory Visit: Payer: Self-pay

## 2021-07-26 ENCOUNTER — Ambulatory Visit (INDEPENDENT_AMBULATORY_CARE_PROVIDER_SITE_OTHER): Payer: Medicaid Other | Admitting: *Deleted

## 2021-07-26 DIAGNOSIS — Z7901 Long term (current) use of anticoagulants: Secondary | ICD-10-CM

## 2021-07-26 LAB — POCT INR: INR: 2.5 (ref 2.0–3.0)

## 2021-07-26 NOTE — Patient Instructions (Signed)
Description   Today take 2 tablets then continue taking warfarin 1.5 tablets daily. Recheck INR in 4 weeks. Be consistent with Ensure (1x week). Coumadin Clinic. [phone].   Bill [phone]?

## 2021-08-04 ENCOUNTER — Other Ambulatory Visit: Payer: Self-pay

## 2021-08-04 ENCOUNTER — Telehealth: Payer: Self-pay | Admitting: Internal Medicine

## 2021-08-04 DIAGNOSIS — Z0100 Encounter for examination of eyes and vision without abnormal findings: Secondary | ICD-10-CM

## 2021-08-04 NOTE — Telephone Encounter (Signed)
Will forward to provider for referrals

## 2021-08-04 NOTE — Telephone Encounter (Signed)
Copied from [place] [id number]. Topic: General - Inquiry >> Jul 27, 2021  1:08 PM Greggory Keen D wrote: Pt called asking who does the office refer to for dental care and optometry care  [phone]

## 2021-08-05 ENCOUNTER — Other Ambulatory Visit: Payer: Self-pay

## 2021-08-05 NOTE — Telephone Encounter (Signed)
Contacted pt and lvm making pt aware that referral was placed for eye doctor and for the dentist she will need to call around or she can call her insurance company to see who is in network and if she has any questions or concerns to give [place] a call

## 2021-08-06 ENCOUNTER — Other Ambulatory Visit: Payer: Self-pay

## 2021-08-11 ENCOUNTER — Other Ambulatory Visit: Payer: Self-pay | Admitting: Cardiology

## 2021-08-11 DIAGNOSIS — Z7901 Long term (current) use of anticoagulants: Secondary | ICD-10-CM

## 2021-08-11 NOTE — Telephone Encounter (Signed)
Prescription refill request received for warfarin Lov: 06/22/21 Marlou Porch)  Next INR check: 08/23/21 Warfarin tablet strength: 5mg   Appropriate dose and refill sent to requested pharmacy.

## 2021-08-13 ENCOUNTER — Other Ambulatory Visit: Payer: Self-pay | Admitting: Internal Medicine

## 2021-08-13 ENCOUNTER — Other Ambulatory Visit: Payer: Self-pay

## 2021-08-13 DIAGNOSIS — G629 Polyneuropathy, unspecified: Secondary | ICD-10-CM

## 2021-08-13 DIAGNOSIS — G621 Alcoholic polyneuropathy: Secondary | ICD-10-CM

## 2021-08-13 NOTE — Telephone Encounter (Signed)
Will route gabapentin to PCP for approval.

## 2021-08-13 NOTE — Telephone Encounter (Signed)
Patient checking on the status of gabapentin (NEURONTIN) 300 MG capsule and gabapentin (NEURONTIN) 100 MG capsule and would like her script sent in today.   Patient states she is now completley out and stated she has contacted the pharmacy and the pharmacy advised they have not heard back from PCP office. Patient last saw PCP  05/24/2021  [place] Phone:  [phone]  Fax:  [phone]

## 2021-08-14 MED ORDER — GABAPENTIN 100 MG PO CAPS: 100.0000 mg | ORAL_CAPSULE | Freq: Every day | ORAL | 3 refills | Status: DC

## 2021-08-14 MED ORDER — GABAPENTIN 300 MG PO CAPS: 300.0000 mg | ORAL_CAPSULE | Freq: Two times a day (BID) | ORAL | 5 refills | Status: DC

## 2021-08-26 ENCOUNTER — Ambulatory Visit (INDEPENDENT_AMBULATORY_CARE_PROVIDER_SITE_OTHER): Payer: Medicaid Other | Admitting: *Deleted

## 2021-08-26 ENCOUNTER — Other Ambulatory Visit: Payer: Self-pay

## 2021-08-26 DIAGNOSIS — Z7901 Long term (current) use of anticoagulants: Secondary | ICD-10-CM | POA: Diagnosis not present

## 2021-08-26 LAB — POCT INR: INR: 2 (ref 2.0–3.0)

## 2021-08-26 NOTE — Patient Instructions (Signed)
Description   Today take 2 tablets (missed Tuesday dose) then continue taking warfarin 1.5 tablets daily. Recheck INR in 3 weeks. Be consistent with Ensure (1x week). Coumadin Clinic. [phone].   Bill [phone]?

## 2021-09-02 ENCOUNTER — Other Ambulatory Visit: Payer: Self-pay | Admitting: Internal Medicine

## 2021-09-02 ENCOUNTER — Other Ambulatory Visit: Payer: Self-pay | Admitting: Cardiology

## 2021-09-02 DIAGNOSIS — Z7901 Long term (current) use of anticoagulants: Secondary | ICD-10-CM

## 2021-09-02 DIAGNOSIS — K920 Hematemesis: Secondary | ICD-10-CM

## 2021-09-02 NOTE — Telephone Encounter (Signed)
Prescription refill request received for warfarin Lov: 06/22/21 Marlou Porch)  Next INR check: 09/17/21 Warfarin tablet strength: 5mg   Appropriate dose and refill sent to requested pharmacy.

## 2021-09-13 ENCOUNTER — Ambulatory Visit: Payer: Self-pay | Admitting: *Deleted

## 2021-09-13 NOTE — Telephone Encounter (Signed)
Patient called, left VM to return the call to the office to discuss symptoms with a nurse.  Message from Scherrie Gerlach sent at 09/13/2021  8:47 AM EST  Pt has a swollen knee.  She says it happens periodically to both knees.  This time it has been 3 days and it is hard to walk when this happens.  Would like appt to see if someone can figure out why this happens

## 2021-09-13 NOTE — Telephone Encounter (Signed)
Pt has a swollen knee.  She says it happens periodically to both knees.  This time it has been 3 days and it is hard to walk when this happens.  Would like appt to see if someone can figure out why this happens   Attempted to call patient - left message to call office

## 2021-09-13 NOTE — Telephone Encounter (Signed)
Patient called, left VM to return the call to the office to discuss symptoms with a nurse. Unable to reach patient after 3 attempts by [facility] NT, routing to the provider for resolution per protocol.     Message from Scherrie Gerlach sent at 09/13/2021  8:47 AM EST  Pt has a swollen knee.  She says it happens periodically to both knees.  This time it has been 3 days and it is hard to walk when this happens.  Would like appt to see if someone can figure out why this happens

## 2021-09-14 NOTE — Telephone Encounter (Signed)
Contacted pt and schedule a ov with [place] for 09/15/21 at 850 a. Pt states she will call transportation and if she is not able to get transportation she will call and change visit to virtual

## 2021-09-15 ENCOUNTER — Other Ambulatory Visit: Payer: Self-pay

## 2021-09-15 ENCOUNTER — Ambulatory Visit: Payer: Medicaid Other | Attending: Physician Assistant | Admitting: Physician Assistant

## 2021-09-15 DIAGNOSIS — M25461 Effusion, right knee: Secondary | ICD-10-CM | POA: Diagnosis not present

## 2021-09-15 DIAGNOSIS — E538 Deficiency of other specified B group vitamins: Secondary | ICD-10-CM

## 2021-09-15 DIAGNOSIS — M25561 Pain in right knee: Secondary | ICD-10-CM

## 2021-09-15 MED ORDER — FLUCONAZOLE 150 MG PO TABS
150.0000 mg | ORAL_TABLET | Freq: Once | ORAL | 0 refills | Status: AC
Start: 2021-09-15 — End: 2021-09-15

## 2021-09-15 MED ORDER — CEPHALEXIN 500 MG PO CAPS: 500.0000 mg | ORAL_CAPSULE | Freq: Three times a day (TID) | ORAL | 0 refills | Status: DC

## 2021-09-15 NOTE — Progress Notes (Signed)
Patient ID: Mary [name], female   DOB: 1960/09/27, 60 y.o.   MRN: [id number] Virtual Visit via Telephone Note  I connected with Mary [name] on 09/15/21 at  8:50 AM EST by telephone and verified that I am speaking with the correct person using two identifiers.  Location: Patient: home Provider: [facility] office   I discussed the limitations, risks, security and privacy concerns of performing an evaluation and management service by telephone and the availability of in person appointments. I also discussed with the patient that there may be a patient responsible charge related to this service. The patient expressed understanding and agreed to proceed.   History of Present Illness: R Knee swelling for about 4 days.  Not red.  Tylenol helps and she takes 1 tramadol daily.  NKI.  No fever.  No redness.  Does not feel ill Pain to the touch on L kneecap for about 2 days-she thinks she may be favoring this knee bc of the other one being swollen.  No tick bites.    Also wanting clarification on B12 dose     Observations/Objective:  NAD.  A&Ox3   Assessment and Plan: 1. Vitamin B12 deficiency Discussed that current dose in chart in 500mg  B12 daily  2. Pain and swelling of right knee Will cover for infection.  ACE wrap recommended for support.  To UC or ED if worsens - cephALEXin (KEFLEX) 500 MG capsule; Take 1 capsule (500 mg total) by mouth 3 (three) times daily.  Dispense: 30 capsule; Refill: 0 - fluconazole (DIFLUCAN) 150 MG tablet; Take 1 tablet (150 mg total) by mouth once for 1 dose.  Dispense: 1 tablet; Refill: 0 - Ambulatory referral to Orthopedic Surgery    Follow Up Instructions: See PCP as next planned   I discussed the assessment and treatment plan with the patient. The patient was provided an opportunity to ask questions and all were answered. The patient agreed with the plan and demonstrated an understanding of the instructions.   The patient was advised to call back or  seek an in-person evaluation if the symptoms worsen or if the condition fails to improve as anticipated.  I provided 16 minutes of non-face-to-face time during this encounter.   Freeman Caldron, PA-C

## 2021-09-17 ENCOUNTER — Other Ambulatory Visit: Payer: Self-pay

## 2021-09-17 ENCOUNTER — Ambulatory Visit (INDEPENDENT_AMBULATORY_CARE_PROVIDER_SITE_OTHER): Payer: Medicaid Other

## 2021-09-17 DIAGNOSIS — Z5181 Encounter for therapeutic drug level monitoring: Secondary | ICD-10-CM

## 2021-09-17 DIAGNOSIS — Z7901 Long term (current) use of anticoagulants: Secondary | ICD-10-CM

## 2021-09-17 LAB — POCT INR: INR: 2 (ref 2.0–3.0)

## 2021-09-17 NOTE — Patient Instructions (Addendum)
Description   Today take 2 tablets  today then START taking warfarin 1.5 tablets daily except 2 on Wednesday. Recheck INR in 10 days. Be consistent with Ensure (1x week). [facility]in Clinic. [phone].   Bill [phone]?

## 2021-09-18 ENCOUNTER — Other Ambulatory Visit: Payer: Self-pay | Admitting: Internal Medicine

## 2021-09-18 DIAGNOSIS — G621 Alcoholic polyneuropathy: Secondary | ICD-10-CM

## 2021-09-18 NOTE — Telephone Encounter (Signed)
Requested medication (s) are due for refill today: yes  Requested medication (s) are on the active medication list: yes  Last refill:  07/27/21  Future visit scheduled: no  Notes to clinic:  med not delegated to NT to RF   Requested Prescriptions  Pending Prescriptions Disp Refills   traMADol (ULTRAM) 50 MG tablet [Pharmacy Med Name: traMADol HCl 50 MG Oral Tablet] 50 tablet 0    Sig: TAKE 1 TABLET BY MOUTH 1 TO 2 TIMES DAILY AS NEEDED FOR PAIN     Not Delegated - Analgesics:  Opioid Agonists Failed - 09/18/2021 11:30 AM      Failed - This refill cannot be delegated      Passed - Urine Drug Screen completed in last 360 days      Passed - Valid encounter within last 6 months    Recent Outpatient Visits           3 days ago Vitamin B12 deficiency   [place], [place], [place]   3 months ago Vitamin B12 deficiency   [place], Deborah B, MD   8 months ago Hematemesis of unknown cause   [place], MD   11 months ago Encounter for medication review   [place], RPH-CPP   1 year ago Alcoholic peripheral neuropathy [facility])   [place] [facility] [name], MD

## 2021-09-22 ENCOUNTER — Telehealth: Payer: Self-pay | Admitting: Internal Medicine

## 2021-09-22 NOTE — Telephone Encounter (Signed)
There is no message.

## 2021-09-29 ENCOUNTER — Other Ambulatory Visit: Payer: Self-pay | Admitting: Cardiology

## 2021-09-29 DIAGNOSIS — Z7901 Long term (current) use of anticoagulants: Secondary | ICD-10-CM

## 2021-10-05 ENCOUNTER — Ambulatory Visit: Payer: Medicaid Other | Admitting: Orthopaedic Surgery

## 2021-10-18 ENCOUNTER — Other Ambulatory Visit: Payer: Self-pay

## 2021-10-18 ENCOUNTER — Ambulatory Visit (INDEPENDENT_AMBULATORY_CARE_PROVIDER_SITE_OTHER): Payer: Medicaid Other | Admitting: *Deleted

## 2021-10-18 DIAGNOSIS — Z5181 Encounter for therapeutic drug level monitoring: Secondary | ICD-10-CM | POA: Diagnosis not present

## 2021-10-18 DIAGNOSIS — Z7901 Long term (current) use of anticoagulants: Secondary | ICD-10-CM

## 2021-10-18 LAB — POCT INR: INR: 2.8 (ref 2.0–3.0)

## 2021-10-18 NOTE — Patient Instructions (Signed)
Description   Continue taking warfarin 1.5 tablets daily except 2 on Wednesday. Recheck INR in 3 weeks. Be consistent with Ensure (1x week). Coumadin Clinic. [phone].   Bill [phone]

## 2021-11-01 ENCOUNTER — Other Ambulatory Visit: Payer: Self-pay | Admitting: Internal Medicine

## 2021-11-01 DIAGNOSIS — K920 Hematemesis: Secondary | ICD-10-CM

## 2021-11-01 DIAGNOSIS — G629 Polyneuropathy, unspecified: Secondary | ICD-10-CM

## 2021-11-02 NOTE — Telephone Encounter (Signed)
Requested Prescriptions  Pending Prescriptions Disp Refills   folic acid (FOLVITE) 1 MG tablet [Pharmacy Med Name: Folic Acid 1 MG Oral Tablet] 90 tablet 1    Sig: Take 1 tablet by mouth once daily     Endocrinology:  Vitamins Passed - 11/01/2021  9:13 AM      Passed - Valid encounter within last 12 months    Recent Outpatient Visits          1 month ago Vitamin B12 deficiency   [place], [place], [place]   5 months ago Vitamin B12 deficiency   [place], Deborah B, MD   10 months ago Hematemesis of unknown cause   [place], Deborah B, MD   1 year ago Encounter for medication review   [place], RPH-CPP   1 year ago Alcoholic peripheral neuropathy [facility])   [place] [name], MD              pantoprazole (PROTONIX) 40 MG tablet [Pharmacy Med Name: Pantoprazole Sodium 40 MG Oral Tablet Delayed Release] 180 tablet 1    Sig: Take 1 tablet by mouth twice daily     Gastroenterology: Proton Pump Inhibitors Passed - 11/01/2021  9:13 AM      Passed - Valid encounter within last 12 months    Recent Outpatient Visits          1 month ago Vitamin B12 deficiency   [place], [place], [place]   5 months ago Vitamin B12 deficiency   [place] [name], MD   10 months ago Hematemesis of unknown cause   [place] [name], MD   1 year ago Encounter for medication review   [place], RPH-CPP   1 year ago Alcoholic peripheral neuropathy [facility])   [place] [facility] [name], MD

## 2021-11-12 ENCOUNTER — Other Ambulatory Visit: Payer: Self-pay | Admitting: Internal Medicine

## 2021-11-12 DIAGNOSIS — G621 Alcoholic polyneuropathy: Secondary | ICD-10-CM

## 2021-11-13 NOTE — Telephone Encounter (Signed)
Requested medication (s) are due for refill today: yes  Requested medication (s) are on the active medication list: yes  Last refill:  09/22/21 #50 with 0 RF  Future visit scheduled: no  Notes to clinic:  Has a newer rx at same pharm, please assess as this medication can not be delegated, please assess.        Requested Prescriptions  Pending Prescriptions Disp Refills   traMADol (ULTRAM) 50 MG tablet [Pharmacy Med Name: traMADol HCl 50 MG Oral Tablet] 50 tablet 0    Sig: TAKE 1 TABLET BY MOUTH 1 TO 2 TIMES DAILY AS NEEDED FOR PAIN     Not Delegated - Analgesics:  Opioid Agonists Failed - 11/12/2021  5:11 PM      Failed - This refill cannot be delegated      Passed - Urine Drug Screen completed in last 360 days      Passed - Valid encounter within last 3 months    Recent Outpatient Visits           1 month ago Vitamin B12 deficiency   [place], [place], [place]   5 months ago Vitamin B12 deficiency   [place], [place]   10 months ago Hematemesis of unknown cause   [place] [name], MD   1 year ago Encounter for medication review   [place], RPH-CPP   1 year ago Alcoholic peripheral neuropathy [facility])   [place] [facility] [name], MD

## 2021-11-22 ENCOUNTER — Ambulatory Visit: Payer: Self-pay

## 2021-11-22 MED ORDER — VITAMIN B-12 500 MCG PO TABS: ORAL_TABLET | ORAL | 2 refills | Status: AC

## 2021-11-22 MED ORDER — THIAMINE HCL 100 MG PO TABS: 100.0000 mg | ORAL_TABLET | Freq: Every day | ORAL | 0 refills | Status: DC

## 2021-11-22 NOTE — Addendum Note (Signed)
Addended by: Karle Plumber B on: 11/22/2021 05:30 PM   Modules accepted: Orders

## 2021-11-22 NOTE — Telephone Encounter (Signed)
Pt is calling to ask how many mg is she supposed to take of the Rx #: [id number]  vitamin B-12 (CYANOCOBALAMIN) 500 MCG tablet [[id number]] ?     Chief Complaint: Asking for Thiamin to be sent to pharmacy - not on medication list, but was ordered per last August encounter. Also asking for refill on Tramadol " but pharmacy said insurance won't pay for it, may need new prescription." Symptoms: n/a Frequency: n/a Pertinent Negatives: Patient denies  Disposition: [] ED /[] Urgent Care (no appt availability in office) / [] Appointment(In office/virtual)/ []  [place] [facility]/ [] Home Care/ [] Refused Recommended Disposition /[] [place] [facility]/ []  Follow-up with PCP Additional Notes: Please advise pt.  Answer Assessment - Initial Assessment Questions 1. NAME of MEDICATION: "What medicine are you calling about?"     Thiamin 100 mg 2. QUESTION: "What is your question?" (e.g., double dose of medicine, side effect)     States Dr. Wynetta Emery put her on this medication but not on med list. 3. PRESCRIBING HCP: "Who prescribed it?" Reason: if prescribed by specialist, call should be referred to that group.     Dr. Wynetta Emery 4. SYMPTOMS: "Do you have any symptoms?"     N/a 5. SEVERITY: If symptoms are present, ask "Are they mild, moderate or severe?"     N/a 6. PREGNANCY:  "Is there any chance that you are pregnant?" "When was your last menstrual period?"     No  Protocols used: Medication Question Call-A-AH

## 2021-11-22 NOTE — Telephone Encounter (Signed)
Called pt made aware of RX refill.

## 2021-12-01 DIAGNOSIS — H40013 Open angle with borderline findings, low risk, bilateral: Secondary | ICD-10-CM | POA: Diagnosis not present

## 2021-12-01 DIAGNOSIS — H462 Nutritional optic neuropathy: Secondary | ICD-10-CM | POA: Diagnosis not present

## 2021-12-01 DIAGNOSIS — H25813 Combined forms of age-related cataract, bilateral: Secondary | ICD-10-CM | POA: Diagnosis not present

## 2021-12-11 ENCOUNTER — Other Ambulatory Visit: Payer: Self-pay | Admitting: Internal Medicine

## 2021-12-11 DIAGNOSIS — Z7901 Long term (current) use of anticoagulants: Secondary | ICD-10-CM

## 2021-12-13 NOTE — Telephone Encounter (Signed)
Prescription refill request received for warfarin ?Lov: skains, 06/22/2021 ?Next INR check: 2/13 ?Warfarin tablet strength: '5mg'$  ? ?Pt is overdue for INR check.  ?

## 2021-12-13 NOTE — Telephone Encounter (Signed)
Called and spoke to pt who stated that she only has 1 tablet left of warfarin. Pt stated the earliest she could come in is on 3/22 due to transportation. Appointment made. Will send in refill so pt does not run out.  ?

## 2021-12-15 ENCOUNTER — Other Ambulatory Visit: Payer: Self-pay

## 2021-12-15 ENCOUNTER — Encounter: Payer: Self-pay | Admitting: Internal Medicine

## 2021-12-15 ENCOUNTER — Ambulatory Visit (INDEPENDENT_AMBULATORY_CARE_PROVIDER_SITE_OTHER): Payer: Medicaid Other

## 2021-12-15 DIAGNOSIS — Z7901 Long term (current) use of anticoagulants: Secondary | ICD-10-CM

## 2021-12-15 LAB — POCT INR: INR: 2.3 (ref 2.0–3.0)

## 2021-12-15 NOTE — Patient Instructions (Signed)
Description   ?Take 2.5 tablets today and then Continue taking warfarin 1.5 tablets daily except 2 on Wednesday. Recheck INR in 4 weeks. Be consistent with Ensure (1x week). Coumadin Clinic. [phone].  ? ?Bill [phone] ?  ?   ?

## 2021-12-21 ENCOUNTER — Telehealth: Payer: Self-pay | Admitting: Internal Medicine

## 2021-12-21 NOTE — Telephone Encounter (Signed)
Will forward to provider  

## 2021-12-21 NOTE — Telephone Encounter (Signed)
Returned pt call and pap has been scheduled  ?

## 2021-12-21 NOTE — Telephone Encounter (Signed)
Copied from [place] [id number]. Topic: Referral - Request for Referral >> Dec 20, 2021  2:44 PM Tessa Lerner A wrote: Has patient seen PCP for this complaint? Yes.   *If NO, is insurance requiring patient see PCP for this issue before PCP can refer them? Referral for which specialty: OBGYN Preferred provider/office: Patient has no preference  Reason for referral: Patient would like a Pap

## 2021-12-27 ENCOUNTER — Ambulatory Visit: Payer: Medicaid Other | Admitting: Orthopedic Surgery

## 2022-01-12 ENCOUNTER — Ambulatory Visit (INDEPENDENT_AMBULATORY_CARE_PROVIDER_SITE_OTHER): Payer: Medicaid Other | Admitting: *Deleted

## 2022-01-12 DIAGNOSIS — Z7901 Long term (current) use of anticoagulants: Secondary | ICD-10-CM | POA: Diagnosis not present

## 2022-01-12 LAB — POCT INR: INR: 3.5 — AB (ref 2.0–3.0)

## 2022-01-12 NOTE — Patient Instructions (Signed)
Description   ?Today take 1 tablet then continue taking warfarin 1.5 tablets daily except 2 tablets on Wednesdays. Recheck INR in 4 weeks. Be consistent with Ensure (1x week) & leafy veggies. Coumadin Clinic. [phone].  ? ?Bill [phone] ?  ?  ?

## 2022-01-17 ENCOUNTER — Telehealth: Payer: Self-pay | Admitting: Internal Medicine

## 2022-01-17 DIAGNOSIS — G621 Alcoholic polyneuropathy: Secondary | ICD-10-CM

## 2022-01-19 NOTE — Telephone Encounter (Signed)
Patient ran out of her traMADol (ULTRAM) 50 MG tablet and would like PCP to refill until her appointment on 01/21/2022. Patient stated pharmacy advised medication was denied.  ? ? ? ?[address]), [address] Phone:  [phone]  ?Fax:  [phone]  ?  ? ?

## 2022-01-19 NOTE — Telephone Encounter (Signed)
Message noted.  Pt needs to be seen prior to any further refills on Tramadol. ?

## 2022-01-21 ENCOUNTER — Ambulatory Visit: Payer: Medicaid Other | Admitting: Internal Medicine

## 2022-01-23 ENCOUNTER — Other Ambulatory Visit: Payer: Self-pay | Admitting: Internal Medicine

## 2022-01-23 DIAGNOSIS — Z7901 Long term (current) use of anticoagulants: Secondary | ICD-10-CM

## 2022-01-23 DIAGNOSIS — G621 Alcoholic polyneuropathy: Secondary | ICD-10-CM

## 2022-01-30 ENCOUNTER — Other Ambulatory Visit: Payer: Self-pay | Admitting: Internal Medicine

## 2022-01-30 DIAGNOSIS — G621 Alcoholic polyneuropathy: Secondary | ICD-10-CM

## 2022-01-31 NOTE — Telephone Encounter (Signed)
Requested medications are due for refill today.  yes ? ?Requested medications are on the active medications list.  yes ? ?Last refill. 11/17/2021 #50 0 refills ? ?Future visit scheduled.   yes ? ?Notes to clinic.  Medication refill is not delegated. ? ? ? ?Requested Prescriptions  ?Pending Prescriptions Disp Refills  ? traMADol (ULTRAM) 50 MG tablet [Pharmacy Med Name: traMADol HCl 50 MG Oral Tablet] 50 tablet 0  ?  Sig: TAKE 1 TABLET BY MOUTH 1 TO 2 TIMES DAILY AS NEEDED FOR PAIN  ?  ? Not Delegated - Analgesics:  Opioid Agonists Failed - 01/30/2022  2:56 PM  ?  ?  Failed - This refill cannot be delegated  ?  ?  Failed - Urine Drug Screen completed in last 360 days  ?  ?  Failed - Valid encounter within last 3 months  ?  Recent Outpatient Visits   ? ?      ? 4 months ago Vitamin B12 deficiency  ? [place], [place]  ? 8 months ago Vitamin B12 deficiency  ? [place] [name], MD  ? 1 year ago Hematemesis of unknown cause  ? [place] [name], MD  ? 1 year ago Encounter for medication review  ? [place], RPH-CPP  ? 1 year ago Alcoholic peripheral neuropathy ([place])  ? [place] [name], MD  ? ?  ?  ?Future Appointments   ? ?        ? In 2 months Gildardo Pounds, NP [place]  ? ?  ? ? ?  ?  ?  ?  ?

## 2022-02-08 NOTE — Telephone Encounter (Signed)
Please fill if appropriate.  

## 2022-02-09 ENCOUNTER — Ambulatory Visit (INDEPENDENT_AMBULATORY_CARE_PROVIDER_SITE_OTHER): Payer: Medicaid Other | Admitting: *Deleted

## 2022-02-09 ENCOUNTER — Encounter: Payer: Self-pay | Admitting: Internal Medicine

## 2022-02-09 DIAGNOSIS — Z7901 Long term (current) use of anticoagulants: Secondary | ICD-10-CM | POA: Diagnosis not present

## 2022-02-09 LAB — POCT INR: INR: 2.1 (ref 2.0–3.0)

## 2022-02-09 NOTE — Patient Instructions (Signed)
Description   ?Today take 2.5 tablets then continue taking warfarin 1.5 tablets daily except 2 tablets on Wednesdays. Recheck INR in 3 weeks. Be consistent with Ensure (1x week) & leafy veggies. Coumadin Clinic. [phone].  ? ?Bill [phone] ?  ?  ?

## 2022-03-04 DIAGNOSIS — H25813 Combined forms of age-related cataract, bilateral: Secondary | ICD-10-CM | POA: Diagnosis not present

## 2022-03-04 DIAGNOSIS — H462 Nutritional optic neuropathy: Secondary | ICD-10-CM | POA: Diagnosis not present

## 2022-03-04 DIAGNOSIS — H40013 Open angle with borderline findings, low risk, bilateral: Secondary | ICD-10-CM | POA: Diagnosis not present

## 2022-03-10 ENCOUNTER — Ambulatory Visit: Payer: Medicaid Other | Admitting: Internal Medicine

## 2022-03-10 ENCOUNTER — Other Ambulatory Visit: Payer: Self-pay

## 2022-03-10 ENCOUNTER — Ambulatory Visit (INDEPENDENT_AMBULATORY_CARE_PROVIDER_SITE_OTHER): Payer: Medicaid Other

## 2022-03-10 DIAGNOSIS — Z7901 Long term (current) use of anticoagulants: Secondary | ICD-10-CM | POA: Diagnosis not present

## 2022-03-10 DIAGNOSIS — Z954 Presence of other heart-valve replacement: Secondary | ICD-10-CM

## 2022-03-10 LAB — POCT INR: INR: 2 (ref 2.0–3.0)

## 2022-03-10 MED ORDER — WARFARIN SODIUM 5 MG PO TABS: ORAL_TABLET | ORAL | 0 refills | Status: DC

## 2022-03-10 NOTE — Telephone Encounter (Signed)
Prescription refill request received for warfarin Lov: 06/22/21 Marlou Porch)  Next INR check: 03/22/22 Warfarin tablet strength: '5mg'$   Appropriate dose and refill sent to requested pharmacy.

## 2022-03-10 NOTE — Patient Instructions (Signed)
Description   Today take 2 tablets then START taking warfarin 1.5 tablets daily except 2 tablets on Sundays and Wednesdays.  Recheck INR in 2 weeks.  Be consistent with Ensure (1x week) & leafy veggies.  Coumadin Clinic. [phone].   Bill [phone]

## 2022-04-12 ENCOUNTER — Ambulatory Visit (INDEPENDENT_AMBULATORY_CARE_PROVIDER_SITE_OTHER): Payer: Medicaid Other | Admitting: *Deleted

## 2022-04-12 DIAGNOSIS — Z7901 Long term (current) use of anticoagulants: Secondary | ICD-10-CM

## 2022-04-12 LAB — PROTIME-INR
INR: 5.6 (ref 0.9–1.2)
Prothrombin Time: 52 s — ABNORMAL HIGH (ref 9.1–12.0)

## 2022-04-12 LAB — POCT INR: INR: 6.6 — AB (ref 2.0–3.0)

## 2022-04-20 ENCOUNTER — Ambulatory Visit: Payer: Medicaid Other | Admitting: Nurse Practitioner

## 2022-04-21 ENCOUNTER — Other Ambulatory Visit: Payer: Self-pay

## 2022-04-21 ENCOUNTER — Ambulatory Visit (INDEPENDENT_AMBULATORY_CARE_PROVIDER_SITE_OTHER): Payer: Medicaid Other

## 2022-04-21 DIAGNOSIS — Z7901 Long term (current) use of anticoagulants: Secondary | ICD-10-CM | POA: Diagnosis not present

## 2022-04-21 LAB — POCT INR: INR: 1.3 — AB (ref 2.0–3.0)

## 2022-04-21 MED ORDER — WARFARIN SODIUM 5 MG PO TABS: ORAL_TABLET | ORAL | 0 refills | Status: DC

## 2022-04-21 NOTE — Patient Instructions (Signed)
Description   Take 2 tablets today and 2 tablets tomorrow and then continue taking warfarin 1.5 tablets daily. Recheck INR in 1 week.  Be consistent with Ensure (1x week) & leafy veggies.  Coumadin Clinic. [phone].   Bill [phone]

## 2022-04-21 NOTE — Telephone Encounter (Signed)
Prescription refill request received for warfarin Lov: 06/22/21 Marlou Porch)  Next INR check: 04/27/22 Warfarin tablet strength: '5mg'$   Appropriate dose and refill sent to requested pharmacy.

## 2022-05-02 ENCOUNTER — Other Ambulatory Visit: Payer: Self-pay | Admitting: Internal Medicine

## 2022-05-02 DIAGNOSIS — K920 Hematemesis: Secondary | ICD-10-CM

## 2022-05-02 DIAGNOSIS — G629 Polyneuropathy, unspecified: Secondary | ICD-10-CM

## 2022-05-03 ENCOUNTER — Telehealth: Payer: Self-pay

## 2022-05-03 NOTE — Telephone Encounter (Signed)
Lpmtcb and reschedule INR check 

## 2022-05-09 ENCOUNTER — Ambulatory Visit (INDEPENDENT_AMBULATORY_CARE_PROVIDER_SITE_OTHER): Payer: Medicaid Other

## 2022-05-09 DIAGNOSIS — Z7901 Long term (current) use of anticoagulants: Secondary | ICD-10-CM

## 2022-05-09 LAB — POCT INR: INR: 3 (ref 2.0–3.0)

## 2022-05-09 NOTE — Patient Instructions (Signed)
continue taking warfarin 1.5 tablets daily. Recheck INR in 3 weeks.  Be consistent with Ensure (1x week) & leafy veggies.  Coumadin Clinic. [phone].   Bill [phone]

## 2022-05-31 ENCOUNTER — Ambulatory Visit: Payer: Medicaid Other

## 2022-06-01 ENCOUNTER — Other Ambulatory Visit: Payer: Self-pay | Admitting: Cardiology

## 2022-06-01 ENCOUNTER — Other Ambulatory Visit: Payer: Self-pay | Admitting: Internal Medicine

## 2022-06-01 DIAGNOSIS — G629 Polyneuropathy, unspecified: Secondary | ICD-10-CM

## 2022-06-01 DIAGNOSIS — Z7901 Long term (current) use of anticoagulants: Secondary | ICD-10-CM

## 2022-06-01 NOTE — Telephone Encounter (Signed)
Refill request for warfarin:  Last INR was 3.0 on 05/09/22 Next INR due on 05/31/22 LOV was 06/22/21  Mary Barrow MD  Refill approved. F/U app with Dr Marlou Porch is in recall.  Message sent to scheduler to make appt.

## 2022-06-02 ENCOUNTER — Other Ambulatory Visit: Payer: Self-pay | Admitting: Internal Medicine

## 2022-06-02 ENCOUNTER — Ambulatory Visit: Payer: Medicaid Other | Admitting: Orthopedic Surgery

## 2022-06-02 DIAGNOSIS — K920 Hematemesis: Secondary | ICD-10-CM

## 2022-06-08 ENCOUNTER — Ambulatory Visit: Payer: Medicaid Other | Attending: Cardiology

## 2022-06-09 ENCOUNTER — Ambulatory Visit: Payer: Medicaid Other | Admitting: Orthopedic Surgery

## 2022-06-22 ENCOUNTER — Telehealth: Payer: Self-pay | Admitting: Internal Medicine

## 2022-06-22 DIAGNOSIS — G621 Alcoholic polyneuropathy: Secondary | ICD-10-CM

## 2022-06-23 ENCOUNTER — Ambulatory Visit: Payer: Medicaid Other | Attending: Cardiology

## 2022-06-23 ENCOUNTER — Ambulatory Visit: Payer: Medicaid Other | Admitting: Nurse Practitioner

## 2022-06-24 ENCOUNTER — Other Ambulatory Visit: Payer: Self-pay | Admitting: Internal Medicine

## 2022-06-24 DIAGNOSIS — G621 Alcoholic polyneuropathy: Secondary | ICD-10-CM

## 2022-06-24 DIAGNOSIS — G629 Polyneuropathy, unspecified: Secondary | ICD-10-CM

## 2022-06-24 MED ORDER — GABAPENTIN 100 MG PO CAPS: 100.0000 mg | ORAL_CAPSULE | Freq: Every day | ORAL | 0 refills | Status: DC

## 2022-06-24 MED ORDER — GABAPENTIN 300 MG PO CAPS: 300.0000 mg | ORAL_CAPSULE | Freq: Two times a day (BID) | ORAL | 0 refills | Status: DC

## 2022-06-24 NOTE — Telephone Encounter (Signed)
Call placed to patient and VM was left informing patient to return phone call to schedule earlier appointment for medication refills.

## 2022-06-24 NOTE — Telephone Encounter (Signed)
Pt just scheduled next available appt, she wants to a refill to last her until her appt. She is completely out of her current supply  She wants to speak to office leadership today. Please advise, tried calling pracitce admin.

## 2022-06-28 ENCOUNTER — Encounter: Payer: Self-pay | Admitting: Family Medicine

## 2022-06-28 ENCOUNTER — Ambulatory Visit: Payer: Medicaid Other | Attending: Family Medicine | Admitting: Family Medicine

## 2022-06-28 VITALS — BP 130/78 | HR 95 | Temp 98.4°F | Ht 62.0 in | Wt 150.6 lb

## 2022-06-28 DIAGNOSIS — E663 Overweight: Secondary | ICD-10-CM

## 2022-06-28 DIAGNOSIS — L603 Nail dystrophy: Secondary | ICD-10-CM | POA: Diagnosis not present

## 2022-06-28 DIAGNOSIS — G621 Alcoholic polyneuropathy: Secondary | ICD-10-CM | POA: Diagnosis not present

## 2022-06-28 MED ORDER — GABAPENTIN 300 MG PO CAPS: 300.0000 mg | ORAL_CAPSULE | Freq: Three times a day (TID) | ORAL | 3 refills | Status: DC

## 2022-06-28 NOTE — Patient Instructions (Signed)
Neuropathic Pain Neuropathic pain is pain caused by damage to the nerves that are responsible for certain sensations in your body (sensory nerves). Neuropathic pain can make you more sensitive to pain. Even a minor sensation can feel very painful. This is usually a long-term (chronic) condition that can be difficult to treat. The type of pain differs from person to person. It may: Start suddenly (acute), or it may develop slowly and become chronic. Come and go as damaged nerves heal, or it may stay at the same level for years. Cause emotional distress, loss of sleep, and a lower quality of life. What are the causes? The most common cause of this condition is diabetes. Many other diseases and conditions can also cause neuropathic pain. Causes of neuropathic pain can be classified as: Toxic. This is caused by medicines and chemicals. The most common causes of toxic neuropathic pain is damage from medicines that kill cancer cells (chemotherapy) or alcohol abuse. Metabolic. This can be caused by: Diabetes. Lack of vitamins like B12. Traumatic. Any injury that cuts, crushes, or stretches a nerve can cause damage and pain. Compression-related. If a sensory nerve gets trapped or compressed for a long period of time, the blood supply to the nerve can be cut off. Vascular. Many blood vessel diseases can cause neuropathic pain by decreasing blood supply and oxygen to nerves. Autoimmune. This type of pain results from diseases in which the body's defense system (immune system) mistakenly attacks sensory nerves. Examples of autoimmune diseases that can cause neuropathic pain include lupus and multiple sclerosis. Infectious. Many types of viral infections can damage sensory nerves and cause pain. Shingles infection is a common cause of this type of pain. Inherited. Neuropathic pain can be a symptom of many diseases that are passed down through families (genetic). What increases the risk? You are more likely to  develop this condition if: You have diabetes. You smoke. You drink too much alcohol. You are taking certain medicines, including chemotherapy or medicines that treat immune system disorders. What are the signs or symptoms? The main symptom is pain. Neuropathic pain is often described as: Burning. Shock-like. Stinging. Hot or cold. Itching. How is this diagnosed? No single test can diagnose neuropathic pain. It is diagnosed based on: A physical exam and your symptoms. Your health care provider will ask you about your pain. You may be asked to use a pain scale to describe how bad your pain is. Tests. These may be done to see if you have a cause and location of any nerve damage. They include: Nerve conduction studies and electromyography to test how well nerve signals travel through your nerves and muscles (electrodiagnostic testing). Skin biopsy to evaluate for small fiber neuropathy. Imaging studies, such as: X-rays. CT scan. MRI. How is this treated? Treatment for neuropathic pain may change over time. You may need to try different treatment options or a combination of treatments. Some options include: Treating the underlying cause of the neuropathy, such as diabetes, kidney disease, or vitamin deficiencies. Stopping medicines that can cause neuropathy, such as chemotherapy. Medicine to relieve pain. Medicines may include: Prescription or over-the-counter pain medicine. Anti-seizure medicine. Antidepressant medicines. Pain-relieving patches or creams that are applied to painful areas of skin. A medicine to numb the area (local anesthetic), which can be injected as a nerve block. Transcutaneous nerve stimulation. This uses electrical currents to block painful nerve signals. The treatment is painless. Alternative treatments, such as: Acupuncture. Meditation. Massage. Occupational or physical therapy. Pain management programs. Counseling. Follow   these instructions at  home: Medicines  Take over-the-counter and prescription medicines only as told by your health care provider. Ask your health care provider if the medicine prescribed to you: Requires you to avoid driving or using machinery. Can cause constipation. You may need to take these actions to prevent or treat constipation: Drink enough fluid to keep your urine pale yellow. Take over-the-counter or prescription medicines. Eat foods that are high in fiber, such as beans, whole grains, and fresh fruits and vegetables. Limit foods that are high in fat and processed sugars, such as fried or sweet foods. Lifestyle  Have a good support system at home. Consider joining a chronic pain support group. Do not use any products that contain nicotine or tobacco. These products include cigarettes, chewing tobacco, and vaping devices, such as e-cigarettes. If you need help quitting, ask your health care provider. Do not drink alcohol. General instructions Learn as much as you can about your condition. Work closely with all your health care providers to find the treatment plan that works best for you. Ask your health care provider what activities are safe for you. Keep all follow-up visits. This is important. Contact a health care provider if: Your pain treatments are not working. You are having side effects from your medicines. You are struggling with tiredness (fatigue), mood changes, depression, or anxiety. Get help right away if: You have thoughts of hurting yourself. Get help right away if you feel like you may hurt yourself or others, or have thoughts about taking your own life. Go to your nearest emergency room or: Call 911. Call the National Suicide Prevention Lifeline at 1-800-273-8255 or 988. This is open 24 hours a day. Text the Crisis Text Line at 741741. Summary Neuropathic pain is pain caused by damage to the nerves that are responsible for certain sensations in your body (sensory  nerves). Neuropathic pain may come and go as damaged nerves heal, or it may stay at the same level for years. Neuropathic pain is usually a long-term condition that can be difficult to treat. Consider joining a chronic pain support group. This information is not intended to replace advice given to you by your health care provider. Make sure you discuss any questions you have with your health care provider. Document Revised: 05/10/2021 Document Reviewed: 05/10/2021 Elsevier Patient Education  2023 [employer]  

## 2022-06-28 NOTE — Progress Notes (Signed)
Subjective:  Patient ID: Mary [name], female    DOB: [birth year]  Age: 61 y.o. MRN: [id number]  CC: Medication Refill   HPI Asuzena [name] is a 61 y.o. year old female patient of Dr. Wynetta Emery with a history of CVA, HFpEF, valvular heart disease (status post MVR, tricuspid repair), alcoholic peripheral neuropathy here for an office visit.  Interval History:  She takes 352m capsules bid and 1027mduring the day but she states it is ineffective and would like to go up on her dose to 30059mid. Neuropathy occurs in her legs but previously occurred in the perioral region as well but that has now subsided.  Was previously on tramadol which she states she had to quit because she was starting to develop dependence. She quit alcohol in 07/2020.  She complains of weight increase and informs me she used to be in the low 100s but now weighs 150 pounds.  She has noticed some dyspnea on exertion since she gained weight. Would like treatment to help with weight loss.  She would also like to have her toenails clipped as they have become really long. Past Medical History:  Diagnosis Date   Abnormal liver function    Acute diastolic heart failure ([facility]6/20/2014   Alcoholism ([facility]  Anemia    B12 deficiency    Carotid stenosis    Carotid US [place]17: bilateral ICA 1-39% >> FU prn   Cataract    Cataract    OU   Childhood asthma    Chronic diastolic CHF (congestive heart failure) (HCC)    Complete heart block (HCC)    Epigastric hernia 200's   GERD (gastroesophageal reflux disease)    Glaucoma suspect    History of blood transfusion    "14 w/1st pregnancy; 2 w/last C-section" (03/15/2013)   Hx of echocardiogram    a. Echo 4/16:  Mild focal basal septal hypertrophy, EF 60-65%, no RWMA, Mechanical MVR ok with mild central regurgitation and no perivalvular leak, small mobile density attached to valvular ring (1.5x1 cm) - post surgical changes vs SBE, mild LAE;   b. 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    Protein calorie malnutrition (HCC)    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   S/P mitral valve replacement with metallic valve 08/32/67/[id number] Carbomedics mechanical prosthesis via right mini thoracotomy approach   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   Complex valvuloplasty including Cor-matrix ECM patch augmentation of anterior and lateral leaflets with 26mm10mards mc3 ring annuloplasty via right mini-thoracotomy approach   Severe mitral regurgitation 04/22/2013   Sinus tachycardia    SVT (supraventricular tachycardia) 03/16/2013   TIA (transient ischemic attack)    Tricuspid regurgitation     Past Surgical History:  Procedure Laterality Date   APPENDECTOMY  ~1978   BALLOON DILATION N/A 11/27/2020   Procedure: BALLOON DILATION;  Surgeon: GessnGatha Mayer  [facility];  Laterality: N/A;   BIOPSY  05/01/2020   Procedure: BIOPSY;  Surgeon: JacobMilus Banister  [facility];;   [place]IL[place] TUB[place]  [place]IG[place]  1999   COLONOSCOPY WITH PROPOFOL N/A 05/01/2020   Procedure: COLONOSCOPY WITH PROPOFOL;  Surgeon: Milus Banister, MD;  Location[facility] ENDOSCOPY;  Service: Endoscopy;  Laterality: N/A;   ESOPHAGOGASTRODUODENOSCOPY (EGD) WITH PROPOFOL N/A 05/01/2020   Procedure: ESOPHAGOGASTRODUODENOSCOPY (EGD) WITH PROPOFOL;  Surgeon: Milus Banister, MD;  Location: [facility] ENDOSCOPY;  Service: Endoscopy;  Laterality: N/A;   ESOPHAGOGASTRODUODENOSCOPY (EGD) WITH PROPOFOL N/A 11/27/2020   Procedure: ESOPHAGOGASTRODUODENOSCOPY (EGD) WITH PROPOFOL;  Surgeon: Gatha Mayer, MD;  Location: [place];  Service: Endoscopy;  Laterality:  N/A;   FEMORAL HERNIA REPAIR Right 05/23/2013   Procedure: HERNIA REPAIR FEMORAL;  Surgeon: Rexene Alberts, MD;  Location: [place];  Service: Open Heart Surgery;  Laterality: Right;   INTRAOPERATIVE TRANSESOPHAGEAL ECHOCARDIOGRAM N/A 05/23/2013   Procedure: INTRAOPERATIVE TRANSESOPHAGEAL ECHOCARDIOGRAM;  Surgeon: Rexene Alberts, MD;  Location: [place];  Service: Open Heart Surgery;  Laterality: N/A;   LAPAROSCOPIC CHOLECYSTECTOMY  2003   LEFT AND RIGHT HEART CATHETERIZATION WITH CORONARY ANGIOGRAM N/A 03/22/2013   Procedure: LEFT AND RIGHT HEART CATHETERIZATION WITH CORONARY ANGIOGRAM;  Surgeon: Burnell Blanks, MD;  Location: [facility] CATH LAB;  Service: Cardiovascular;  Laterality: N/A;   MINIMALLY INVASIVE TRICUSPID VALVE REPAIR Right 05/23/2013   Procedure: MINIMALLY INVASIVE TRICUSPID VALVE REPAIR;  Surgeon: Rexene Alberts, MD;  Location: [place];  Service: Open Heart Surgery;  Laterality: Right;   MITRAL VALVE REPLACEMENT N/A 05/23/2013   Procedure: MITRAL VALVE (MV) REPLACEMENT;  Surgeon: Rexene Alberts, MD;  Location: [place];  Service: Open Heart Surgery;  Laterality: N/A;   MULTIPLE EXTRACTIONS WITH ALVEOLOPLASTY N/A 04/04/2013   Procedure: Extraction of tooth #'s 1,8,9,13,14,15,23,24,25,26 with alveoloplasty and gross debridement of remaining teeth;  Surgeon: Lenn Cal, DDS;  Location: [place];  Service: Oral Surgery;  Laterality: N/A;   TEE WITHOUT CARDIOVERSION N/A 03/18/2013   Procedure: TRANSESOPHAGEAL ECHOCARDIOGRAM (TEE);  Surgeon: Larey Dresser, MD;  Location: [place];  Service: Cardiovascular;  Laterality: N/A;   TEE WITHOUT CARDIOVERSION N/A 06/17/2013   Procedure: TRANSESOPHAGEAL ECHOCARDIOGRAM (TEE);  Surgeon: Lelon Perla, MD;  Location: [facility] ENDOSCOPY;  Service: Cardiovascular;  Laterality: N/A;   TUBAL LIGATION  1999    Family History  Problem Relation Age of Onset   Kidney disease Mother        Had one Kidney removed   Stroke Father    Cancer Father     Sarcoidosis Sister    Heart attack Neg Hx    Colon cancer Neg Hx    Stomach cancer Neg Hx    Pancreatic cancer Neg Hx    Esophageal cancer Neg Hx     Social History   Socioeconomic History   Marital status: Single    Spouse name: Not on file   Number of children: Not on file   Years of education: Not on file   Highest education level: Not on file  Occupational History   Not on file  Tobacco Use   Smoking status: Former    Packs/day: 0.50    Years: 36.00    Total pack years: 18.00    Types: Cigarettes    Quit date: 03/15/2013    Years since quitting: 9.2   Smokeless tobacco: Never  Vaping Use   Vaping Use: Never used  Substance and Sexual Activity   Alcohol use: Yes    Comment:  "glass of wine"   Drug use: No   Sexual activity: Never  Other Topics Concern   Not on  file  Social History Narrative   Right handed   Two story home apartment    Social Determinants of Health   Financial Resource Strain: Not on file  Food Insecurity: Not on file  Transportation Needs: Not on file  Physical Activity: Not on file  Stress: Not on file  Social Connections: Not on file    Allergies  Allergen Reactions   Oxycodone Itching    Pt states no longer makes her itchy   Tape Rash and Other (See Comments)    THE ONLY TAPE THAT IS TOLERATED IS PAPER TAPE. EKG leads inflame the skin    Outpatient Medications Prior to Visit  Medication Sig Dispense Refill   folic acid (FOLVITE) 1 MG tablet Take 1 tablet by mouth once daily 30 tablet 0   Naphazoline HCl (CLEAR EYES OP) Place 1 drop into both eyes continuous as needed (dry/irritated eyes).     pantoprazole (PROTONIX) 40 MG tablet Take 1 tablet by mouth twice daily 60 tablet 0   thiamine 100 MG tablet Take 1 tablet (100 mg total) by mouth daily. 100 tablet 0   vitamin B-12 (CYANOCOBALAMIN) 500 MCG tablet TAKE 500 MCG BY MOUTH DAILY. 100 tablet 2   warfarin (COUMADIN) 5 MG tablet TAKE 1 & 1/2 TO 2 (ONE & ONE-HALF TO TWO) TABLETS BY  MOUTH ONCE DAILY AS DIRECTED BY  COUMADIN  CLINIC 50 tablet 3   gabapentin (NEURONTIN) 100 MG capsule Take 1 capsule (100 mg total) by mouth daily. Take at noon time.  Needs to keep upcoming appt. 5 capsule 0   gabapentin (NEURONTIN) 300 MG capsule Take 1 capsule (300 mg total) by mouth 2 (two) times daily. Needs to keep appt 06/28/2022 for further refills 10 capsule 0   cephALEXin (KEFLEX) 500 MG capsule Take 1 capsule (500 mg total) by mouth 3 (three) times daily. (Patient not taking: Reported on 06/28/2022) 30 capsule 0   furosemide (LASIX) 20 MG tablet TAKE 1 TABLET (20 MG TOTAL) BY MOUTH DAILY. 30 tablet 3   potassium chloride (KLOR-CON) 10 MEQ tablet TAKE 1 TABLET (10 MEQ TOTAL) BY MOUTH DAILY. TO TAKE WITH FLUID PILL 30 tablet 1   traMADol (ULTRAM) 50 MG tablet TAKE 1 TABLET BY MOUTH 1 TO 2 TIMES DAILY AS NEEDED FOR PAIN (Patient not taking: Reported on 06/28/2022) 50 tablet 0   No facility-administered medications prior to visit.     ROS Review of Systems  Constitutional:  Negative for activity change and appetite change.  HENT:  Negative for sinus pressure and sore throat.   Respiratory:  Positive for shortness of breath (on exertion). Negative for chest tightness and wheezing.   Cardiovascular:  Negative for chest pain and palpitations.  Gastrointestinal:  Negative for abdominal distention, abdominal pain and constipation.  Genitourinary: Negative.   Musculoskeletal: Negative.   Neurological:  Positive for numbness.  Psychiatric/Behavioral:  Negative for behavioral problems and dysphoric mood.     Objective:  BP 130/78   Pulse 95   Temp 98.4 F (36.9 C) (Oral)   Ht _0  (1.575 m)   Wt 150 lb 9.6 oz (68.3 kg)   LMP 01/28/2013   SpO2 98%   BMI 27.55 kg/m      06/28/2022    3:22 PM 06/22/2021    9:47 AM 05/28/2021    1:56 PM  BP/Weight  Systolic BP 431 540 086  Diastolic BP 78 62 89  Wt. (Lbs) 150.6 116 115  BMI 27.55 kg/m2 21.22 kg/m2 21.03  kg/m2      Physical  Exam Constitutional:      Appearance: She is well-developed.  Cardiovascular:     Rate and Rhythm: Normal rate.     Heart sounds: Normal heart sounds. No murmur heard. Pulmonary:     Effort: Pulmonary effort is normal.     Breath sounds: Normal breath sounds. No wheezing or rales.  Chest:     Chest wall: No tenderness.  Abdominal:     General: Bowel sounds are normal. There is no distension.     Palpations: Abdomen is soft. There is no mass.     Tenderness: There is no abdominal tenderness.  Musculoskeletal:        General: Normal range of motion.     Right lower leg: No edema.     Left lower leg: No edema.  Neurological:     Mental Status: She is alert and oriented to person, place, and time.  Psychiatric:        Mood and Affect: Mood normal.        Latest Ref Rng & Units 01/30/2021    5:28 PM 12/24/2020   12:01 PM 12/03/2020    6:20 AM  CMP  Glucose 70 - 99 mg/dL 93  80  89   BUN 6 - 20 mg/dL _0 Creatinine 0.44 - 1.00 mg/dL 1.17  0.72  0.83   Sodium 135 - 145 mmol/L 139  141  138   Potassium 3.5 - 5.1 mmol/L 3.1  3.9  4.0   Chloride 98 - 111 mmol/L 102  106  106   CO2 22 - 32 mmol/L _1 Calcium 8.9 - 10.3 mg/dL 7.7  8.6  8.6   Total Protein 6.5 - 8.1 g/dL 6.7  5.8    Total Bilirubin 0.3 - 1.2 mg/dL 0.6  0.3    Alkaline Phos 38 - 126 U/L 156  127    AST 15 - 41 U/L 48  43    ALT 0 - 44 U/L 18  16      Lipid Panel     Component Value Date/Time   CHOL 189 05/31/2018 1108   TRIG 300 (H) 05/31/2018 1108   HDL 68 05/31/2018 1108   CHOLHDL 2.8 05/31/2018 1108   CHOLHDL 3.2 06/06/2016 0135   VLDL 22 06/06/2016 0135   LDLCALC 61 05/31/2018 1108    CBC    Component Value Date/Time   WBC 8.8 01/30/2021 1728   RBC 3.65 (L) 01/30/2021 1728   HGB 12.8 01/30/2021 1728   HGB 9.6 (L) 12/24/2020 1201   HCT 37.2 01/30/2021 1728   HCT 29.0 (L) 12/24/2020 1201   PLT 318 01/30/2021 1728   PLT 361 12/24/2020 1201   MCV 101.9 (H) 01/30/2021 1728   MCV 108  (H) 12/24/2020 1201   MCH 35.1 (H) 01/30/2021 1728   MCHC 34.4 01/30/2021 1728   RDW 13.8 01/30/2021 1728   RDW 15.7 (H) 12/24/2020 1201   LYMPHSABS 1.2 01/30/2021 1728   LYMPHSABS 1.9 06/26/2020 1218   MONOABS 0.5 01/30/2021 1728   EOSABS 0.0 01/30/2021 1728   EOSABS 0.2 06/26/2020 1218   BASOSABS 0.0 01/30/2021 1728   BASOSABS 0.0 06/26/2020 1218    Lab Results  Component Value Date   HGBA1C 4.2 (L) 08/18/2020    Assessment & Plan:  1. Alcoholic peripheral neuropathy (HCC) Uncontrolled Increase dose of gabapentin Commended on quitting alcohol - gabapentin (NEURONTIN) 300  MG capsule; Take 1 capsule (300 mg total) by mouth 3 (three) times daily.  Dispense: 90 capsule; Refill: 3 - CMP14+EGFR - Gamma GT  2. Overweight Weight gain could explain her dyspnea; oxygen saturation is normal Decrease caloric intake and increase physical activity as tolerated - Amb Ref to Medical Weight Management  3. Dystrophic nail - Ambulatory referral to Podiatry    Meds ordered this encounter  Medications   gabapentin (NEURONTIN) 300 MG capsule    Sig: Take 1 capsule (300 mg total) by mouth 3 (three) times daily.    Dispense:  90 capsule    Refill:  3    Discontinue 13m    Follow-up: Return in about 3 months (around 09/28/2022) for Medical conditions with PCP.       ECharlott Rakes MD, FAAFP. CSo [facility] [place]  06/28/2022, 5:10 PM

## 2022-06-29 ENCOUNTER — Other Ambulatory Visit: Payer: Self-pay | Admitting: Family Medicine

## 2022-06-29 DIAGNOSIS — E875 Hyperkalemia: Secondary | ICD-10-CM

## 2022-06-29 LAB — CMP14+EGFR
ALT: 11 IU/L (ref 0–32)
AST: 22 IU/L (ref 0–40)
Albumin/Globulin Ratio: 1.4 (ref 1.2–2.2)
Albumin: 4.6 g/dL (ref 3.8–4.9)
Alkaline Phosphatase: 163 IU/L — ABNORMAL HIGH (ref 44–121)
BUN/Creatinine Ratio: 13 (ref 9–23)
BUN: 18 mg/dL (ref 6–24)
Bilirubin Total: 0.2 mg/dL (ref 0.0–1.2)
CO2: 24 mmol/L (ref 20–29)
Calcium: 9.7 mg/dL (ref 8.7–10.2)
Chloride: 105 mmol/L (ref 96–106)
Creatinine, Ser: 1.34 mg/dL — ABNORMAL HIGH (ref 0.57–1.00)
Globulin, Total: 3.3 g/dL (ref 1.5–4.5)
Glucose: 92 mg/dL (ref 70–99)
Potassium: 5.5 mmol/L — ABNORMAL HIGH (ref 3.5–5.2)
Sodium: 139 mmol/L (ref 134–144)
Total Protein: 7.9 g/dL (ref 6.0–8.5)
eGFR: 46 mL/min/{1.73_m2} — ABNORMAL LOW (ref >59)

## 2022-06-29 LAB — GAMMA GT: GGT: 120 IU/L — ABNORMAL HIGH (ref 0–60)

## 2022-06-30 ENCOUNTER — Ambulatory Visit: Payer: Medicaid Other | Attending: Cardiology | Admitting: *Deleted

## 2022-06-30 DIAGNOSIS — Z7901 Long term (current) use of anticoagulants: Secondary | ICD-10-CM

## 2022-06-30 LAB — POCT INR: INR: 7.2 — AB (ref 2.0–3.0)

## 2022-06-30 NOTE — Patient Instructions (Signed)
Description   Spoke with pt and advised to hold warfarin today, no warfarin tomorrow, no warfarin Saturday then continue taking warfarin 1.5 tablets daily. Resume normal leafy intake and ensure. Recheck INR in 5 days.  Be consistent with Ensure (1x week) & leafy veggies.  [facility]. [phone] or [phone].   Bill [phone]

## 2022-07-03 ENCOUNTER — Other Ambulatory Visit: Payer: Self-pay | Admitting: Internal Medicine

## 2022-07-03 DIAGNOSIS — G629 Polyneuropathy, unspecified: Secondary | ICD-10-CM

## 2022-07-03 DIAGNOSIS — K920 Hematemesis: Secondary | ICD-10-CM

## 2022-07-04 NOTE — Telephone Encounter (Signed)
Medication Refill - Medication: pantoprazole (PROTONIX) 40 MG tablet folic acid (FOLVITE) 1 MG tablet  Has the patient contacted their pharmacy? Yes.   (Agent: If no, request that the patient contact the pharmacy for the refill. If patient does not wish to contact the pharmacy document the reason why and proceed with request.) (Agent: If yes, when and what did the pharmacy advise?)  Preferred Pharmacy (with phone number or street name):  [facility] [address] Phone:  [phone]  Fax:  [phone]     Has the patient been seen for an appointment in the last year OR does the patient have an upcoming appointment? Yes.    Agent: Please be advised that RX refills may take up to 3 business days. We ask that you follow-up with your pharmacy.  *Patient stated that she is currently out of medication.

## 2022-07-05 ENCOUNTER — Ambulatory Visit: Payer: Medicaid Other | Attending: Cardiology | Admitting: *Deleted

## 2022-07-05 DIAGNOSIS — Z7901 Long term (current) use of anticoagulants: Secondary | ICD-10-CM

## 2022-07-05 LAB — POCT INR: INR: 1 — AB (ref 2.0–3.0)

## 2022-07-05 NOTE — Patient Instructions (Signed)
Description   Today and tomorrow take 2 tablets then continue taking warfarin 1.5 tablets daily. Resume normal leafy intake and ensure. Recheck INR in 1 week.  Be consistent with Ensure (1x week) & leafy veggies.  Coumadin Clinic. [phone] or [phone].   Bill [phone]

## 2022-07-11 ENCOUNTER — Ambulatory Visit: Payer: Medicaid Other | Admitting: Podiatry

## 2022-07-11 DIAGNOSIS — M79675 Pain in left toe(s): Secondary | ICD-10-CM | POA: Diagnosis not present

## 2022-07-11 DIAGNOSIS — M79674 Pain in right toe(s): Secondary | ICD-10-CM | POA: Diagnosis not present

## 2022-07-11 DIAGNOSIS — B351 Tinea unguium: Secondary | ICD-10-CM

## 2022-07-11 NOTE — Progress Notes (Signed)
Chief Complaint  Patient presents with   foot care    Patient is here for nail trim, the patient state that her toe nails have nail discoloration.    SUBJECTIVE Patient presents to office today complaining of elongated, thickened nails that cause pain while ambulating in shoes.  Patient is unable to trim their own nails. Patient is here for further evaluation and treatment.  Past Medical History:  Diagnosis Date   Abnormal liver function    Acute diastolic heart failure ([place]) 03/15/2013   Alcoholism ([place])    Anemia    B12 deficiency    Carotid stenosis    Carotid US 2/17: bilateral ICA 1-39% >> FU prn   Cataract    Cataract    OU   Childhood asthma    Chronic diastolic CHF (congestive heart failure) (HCC)    Complete heart block (HCC)    Epigastric hernia 200's   GERD (gastroesophageal reflux disease)    Glaucoma suspect    History of blood transfusion    "14 w/1st pregnancy; 2 w/last C-section" (03/15/2013)   Hx of echocardiogram    a. Echo 4/16:  Mild focal basal septal hypertrophy, EF 60-65%, no RWMA, Mechanical MVR ok with mild central regurgitation and no perivalvular leak, small mobile density attached to valvular ring (1.5x1 cm) - post surgical changes vs SBE, mild LAE;   b. 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   S/P mitral valve replacement with metallic valve 67/67/2094   68m Sorin Carbomedics mechanical prosthesis via right mini thoracotomy approach   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   Complex valvuloplasty including Cor-matrix ECM patch augmentation of anterior and lateral leaflets with 251mEdwards mc3 ring annuloplasty via right mini-thoracotomy approach   Severe mitral regurgitation  04/22/2013   Sinus tachycardia    SVT (supraventricular tachycardia) 03/16/2013   TIA (transient ischemic attack)    Tricuspid regurgitation     Allergies  Allergen Reactions   Oxycodone Itching    Pt states no longer makes her itchy   Tape Rash and Other (See Comments)    THE ONLY TAPE THAT IS TOLERATED IS PAPER TAPE. EKG leads inflame the skin     OBJECTIVE General Patient is awake, alert, and oriented x 3 and in no acute distress. Derm Skin is dry and supple bilateral. Negative open lesions or macerations. Remaining integument unremarkable. Nails are tender, long, thickened and dystrophic with subungual debris, consistent with onychomycosis, 1-5 bilateral. No signs of infection noted. Vasc  DP and PT pedal pulses palpable bilaterally. Temperature gradient within normal limits.  Neuro Epicritic and protective threshold sensation grossly intact bilaterally.  Musculoskeletal Exam No symptomatic pedal deformities noted bilateral. Muscular strength within normal limits.  ASSESSMENT 1.  Pain due to onychomycosis of toenails both  PLAN OF CARE 1. Patient evaluated today.  2. Instructed to maintain good pedal hygiene and foot care.  3. Mechanical debridement of nails 1-5 bilaterally performed using a nail nipper. Filed with dremel without incident.  4. Return to clinic in 3 mos.    BrEdrick KinsDPM [facility]  Dr. BrEdrick KinsDPM    [address]  [place], [place] [address]                Office [phone]  Fax [phone]

## 2022-07-14 ENCOUNTER — Other Ambulatory Visit: Payer: Medicaid Other

## 2022-07-14 ENCOUNTER — Ambulatory Visit: Payer: Medicaid Other

## 2022-07-18 ENCOUNTER — Other Ambulatory Visit: Payer: Self-pay

## 2022-07-18 ENCOUNTER — Emergency Department (HOSPITAL_COMMUNITY): Payer: Medicaid Other

## 2022-07-18 ENCOUNTER — Observation Stay (HOSPITAL_COMMUNITY)
Admission: EM | Admit: 2022-07-18 | Discharge: 2022-07-19 | Disposition: A | Discharge status: Home / Self Care | Payer: Medicaid Other | Attending: Family Medicine | Admitting: Family Medicine

## 2022-07-18 DIAGNOSIS — N1831 Chronic kidney disease, stage 3a: Secondary | ICD-10-CM | POA: Insufficient documentation

## 2022-07-18 DIAGNOSIS — R42 Dizziness and giddiness: Secondary | ICD-10-CM | POA: Diagnosis not present

## 2022-07-18 DIAGNOSIS — I442 Atrioventricular block, complete: Secondary | ICD-10-CM | POA: Insufficient documentation

## 2022-07-18 DIAGNOSIS — Z87891 Personal history of nicotine dependence: Secondary | ICD-10-CM | POA: Diagnosis not present

## 2022-07-18 DIAGNOSIS — Z954 Presence of other heart-valve replacement: Secondary | ICD-10-CM

## 2022-07-18 DIAGNOSIS — R55 Syncope and collapse: Principal | ICD-10-CM | POA: Insufficient documentation

## 2022-07-18 DIAGNOSIS — E861 Hypovolemia: Secondary | ICD-10-CM | POA: Diagnosis not present

## 2022-07-18 DIAGNOSIS — I959 Hypotension, unspecified: Secondary | ICD-10-CM | POA: Diagnosis not present

## 2022-07-18 DIAGNOSIS — J45909 Unspecified asthma, uncomplicated: Secondary | ICD-10-CM | POA: Insufficient documentation

## 2022-07-18 DIAGNOSIS — R231 Pallor: Secondary | ICD-10-CM | POA: Diagnosis not present

## 2022-07-18 DIAGNOSIS — Z79899 Other long term (current) drug therapy: Secondary | ICD-10-CM | POA: Insufficient documentation

## 2022-07-18 DIAGNOSIS — I130 Hypertensive heart and chronic kidney disease with heart failure and stage 1 through stage 4 chronic kidney disease, or unspecified chronic kidney disease: Secondary | ICD-10-CM | POA: Diagnosis not present

## 2022-07-18 DIAGNOSIS — N179 Acute kidney failure, unspecified: Secondary | ICD-10-CM | POA: Diagnosis not present

## 2022-07-18 DIAGNOSIS — I5033 Acute on chronic diastolic (congestive) heart failure: Secondary | ICD-10-CM | POA: Diagnosis not present

## 2022-07-18 DIAGNOSIS — Z7901 Long term (current) use of anticoagulants: Secondary | ICD-10-CM | POA: Diagnosis not present

## 2022-07-18 DIAGNOSIS — Z8673 Personal history of transient ischemic attack (TIA), and cerebral infarction without residual deficits: Secondary | ICD-10-CM | POA: Diagnosis not present

## 2022-07-18 DIAGNOSIS — R9431 Abnormal electrocardiogram [ECG] [EKG]: Secondary | ICD-10-CM | POA: Insufficient documentation

## 2022-07-18 DIAGNOSIS — I10 Essential (primary) hypertension: Secondary | ICD-10-CM | POA: Diagnosis not present

## 2022-07-18 DIAGNOSIS — R569 Unspecified convulsions: Secondary | ICD-10-CM | POA: Diagnosis not present

## 2022-07-18 DIAGNOSIS — I5030 Unspecified diastolic (congestive) heart failure: Secondary | ICD-10-CM | POA: Diagnosis present

## 2022-07-18 DIAGNOSIS — Z952 Presence of prosthetic heart valve: Secondary | ICD-10-CM | POA: Insufficient documentation

## 2022-07-18 MED ORDER — SODIUM CHLORIDE 0.9 % IV BOLUS
1000.0000 mL | Freq: Once | INTRAVENOUS | Status: AC
  Administered 2022-07-19: 1000 mL via INTRAVENOUS

## 2022-07-18 MED ORDER — ONDANSETRON HCL 4 MG/2ML IJ SOLN: 4.0000 mg | Freq: Once | INTRAMUSCULAR | Status: DC

## 2022-07-18 NOTE — ED Triage Notes (Signed)
Patient BIB EMS for evaluation of syncopal episode and dizziness.  Reports "she stiffened up in bed and they thought it was a seizure.  We got her up to do orthostatic vitals and she did it again and passed out.  Her heart rate went down to the thirties."  Patient reports dizziness and "out of body experience."  +ETOH use tonight.

## 2022-07-19 ENCOUNTER — Encounter (HOSPITAL_COMMUNITY): Payer: Self-pay | Admitting: Family Medicine

## 2022-07-19 ENCOUNTER — Observation Stay (HOSPITAL_BASED_OUTPATIENT_CLINIC_OR_DEPARTMENT_OTHER): Payer: Medicaid Other

## 2022-07-19 DIAGNOSIS — E861 Hypovolemia: Secondary | ICD-10-CM | POA: Diagnosis not present

## 2022-07-19 DIAGNOSIS — N1831 Chronic kidney disease, stage 3a: Secondary | ICD-10-CM

## 2022-07-19 DIAGNOSIS — R9431 Abnormal electrocardiogram [ECG] [EKG]: Secondary | ICD-10-CM | POA: Diagnosis present

## 2022-07-19 DIAGNOSIS — Z954 Presence of other heart-valve replacement: Secondary | ICD-10-CM

## 2022-07-19 DIAGNOSIS — N179 Acute kidney failure, unspecified: Secondary | ICD-10-CM | POA: Diagnosis not present

## 2022-07-19 DIAGNOSIS — I5031 Acute diastolic (congestive) heart failure: Secondary | ICD-10-CM

## 2022-07-19 DIAGNOSIS — R55 Syncope and collapse: Secondary | ICD-10-CM

## 2022-07-19 DIAGNOSIS — I9589 Other hypotension: Secondary | ICD-10-CM

## 2022-07-19 DIAGNOSIS — I442 Atrioventricular block, complete: Secondary | ICD-10-CM

## 2022-07-19 LAB — CBC
HCT: 32.6 % — ABNORMAL LOW (ref 36.0–46.0)
Hemoglobin: 10.5 g/dL — ABNORMAL LOW (ref 12.0–15.0)
MCH: 33.2 pg (ref 26.0–34.0)
MCHC: 32.2 g/dL (ref 30.0–36.0)
MCV: 103.2 fL — ABNORMAL HIGH (ref 80.0–100.0)
Platelets: 285 10*3/uL (ref 150–400)
RBC: 3.16 MIL/uL — ABNORMAL LOW (ref 3.87–5.11)
RDW: 14.5 % (ref 11.5–15.5)
WBC: 9.2 10*3/uL (ref 4.0–10.5)
nRBC: 0 % (ref 0.0–0.2)

## 2022-07-19 LAB — ECHOCARDIOGRAM COMPLETE
Area-P 1/2: 4 cm2
Calc EF: 48.5 %
Height: 62 in
MV VTI: 1.53 cm2
S' Lateral: 3.6 cm
Single Plane A2C EF: 54 %
Single Plane A4C EF: 41.7 %
Weight: 2400 oz

## 2022-07-19 LAB — BASIC METABOLIC PANEL
Anion gap: 14 (ref 5–15)
BUN: 21 mg/dL — ABNORMAL HIGH (ref 6–20)
CO2: 20 mmol/L — ABNORMAL LOW (ref 22–32)
Calcium: 9.1 mg/dL (ref 8.9–10.3)
Chloride: 105 mmol/L (ref 98–111)
Creatinine, Ser: 1.68 mg/dL — ABNORMAL HIGH (ref 0.44–1.00)
GFR, Estimated: 35 mL/min — ABNORMAL LOW (ref >60)
Glucose, Bld: 120 mg/dL — ABNORMAL HIGH (ref 70–99)
Potassium: 4.7 mmol/L (ref 3.5–5.1)
Sodium: 139 mmol/L (ref 135–145)

## 2022-07-19 LAB — URINALYSIS, ROUTINE W REFLEX MICROSCOPIC
Bacteria, UA: NONE SEEN
Bilirubin Urine: NEGATIVE
Glucose, UA: NEGATIVE mg/dL
Ketones, ur: NEGATIVE mg/dL
Leukocytes,Ua: NEGATIVE
Nitrite: NEGATIVE
Protein, ur: 100 mg/dL — AB
Specific Gravity, Urine: 1.012 (ref 1.005–1.030)
pH: 6 (ref 5.0–8.0)

## 2022-07-19 LAB — TROPONIN I (HIGH SENSITIVITY): Troponin I (High Sensitivity): 9 ng/L (ref <18)

## 2022-07-19 LAB — MAGNESIUM: Magnesium: 2 mg/dL (ref 1.7–2.4)

## 2022-07-19 LAB — PROTIME-INR
INR: 2.1 — ABNORMAL HIGH (ref 0.8–1.2)
Prothrombin Time: 23.3 seconds — ABNORMAL HIGH (ref 11.4–15.2)

## 2022-07-19 LAB — ETHANOL: Alcohol, Ethyl (B): <10 mg/dL (ref <10)

## 2022-07-19 MED ORDER — SENNOSIDES-DOCUSATE SODIUM 8.6-50 MG PO TABS: 1.0000 | ORAL_TABLET | Freq: Every evening | ORAL | Status: DC | PRN

## 2022-07-19 MED ORDER — WARFARIN - PHARMACIST DOSING INPATIENT: Freq: Every day | Status: DC

## 2022-07-19 MED ORDER — GABAPENTIN 300 MG PO CAPS
300.0000 mg | ORAL_CAPSULE | Freq: Three times a day (TID) | ORAL | Status: DC
  Administered 2022-07-19: 300 mg via ORAL
  Filled 2022-07-19: qty 1

## 2022-07-19 MED ORDER — SODIUM CHLORIDE 0.9 % IV SOLN: INTRAVENOUS | Status: AC

## 2022-07-19 MED ORDER — SODIUM CHLORIDE 0.9% FLUSH
3.0000 mL | Freq: Two times a day (BID) | INTRAVENOUS | Status: DC
  Administered 2022-07-19 (×2): 3 mL via INTRAVENOUS

## 2022-07-19 MED ORDER — PANTOPRAZOLE SODIUM 40 MG PO TBEC
40.0000 mg | DELAYED_RELEASE_TABLET | Freq: Two times a day (BID) | ORAL | Status: DC
  Administered 2022-07-19: 40 mg via ORAL
  Filled 2022-07-19: qty 1

## 2022-07-19 MED ORDER — ACETAMINOPHEN 650 MG RE SUPP: 650.0000 mg | Freq: Four times a day (QID) | RECTAL | Status: DC | PRN

## 2022-07-19 MED ORDER — WARFARIN SODIUM 10 MG PO TABS
10.0000 mg | ORAL_TABLET | Freq: Once | ORAL | Status: DC
  Filled 2022-07-19: qty 1

## 2022-07-19 MED ORDER — ACETAMINOPHEN 325 MG PO TABS
650.0000 mg | ORAL_TABLET | Freq: Four times a day (QID) | ORAL | Status: DC | PRN
  Administered 2022-07-19 (×2): 650 mg via ORAL
  Filled 2022-07-19 (×2): qty 2

## 2022-07-19 NOTE — ED Notes (Signed)
Pt wants to talk to MD as pt states she is ready to go home. MD messaged at this time. Waiting for call back.

## 2022-07-19 NOTE — ED Notes (Signed)
Patient provided with personal hygiene items and a new gown per request.  Warm blankets provided for comfort

## 2022-07-19 NOTE — ED Notes (Signed)
Pt transported to "treadmill"

## 2022-07-19 NOTE — H&P (Addendum)
History and Physical    Mary [name] DXA:[id number] DOB: [birth year] DOA: 07/18/2022  PCP: [name], MD   Patient coming from: Home   Chief Complaint: Syncope   HPI: Mary [name] is a pleasant 61 y.o. 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  Patient reports that she had been in her usual state of health yesterday until developing lightheadedness in the evening that was worse on standing.  She has history of excessive alcohol use, had stopped drinking completely in 2021, but then had 5 drinks last night.    EMS was called and observed syncopal episode when they attempted to stand the patient up.  EMS reports that heart rate dipped to the 30s at that time. She denies any associated chest pain or palpitations but was experiencing nausea with nonbloody emesis.     ED Course: Upon arrival to the ED, patient is found to be afebrile and saturating mid 90s on room air with systolic blood pressure in the upper 70s to 80s.  EKG features sinus rhythm with PVC, first-degree AV nodal block, and prolonged QT interval.  Chest x-ray is negative for acute cardiopulmonary disease.  Blood work notable for creatinine 1.68, hemoglobin 10.5, INR 2.1, and normal initial troponin.  Patient was given a liter of saline in the ED.  Review of Systems:  All other systems reviewed and apart from HPI, are negative.  Past Medical History:  Diagnosis Date   Abnormal liver function    Acute diastolic heart failure ([place]) 03/15/2013   Alcoholism ([place])    Anemia    B12 deficiency    Carotid stenosis    Carotid US 2/17: bilateral ICA 1-39% >> FU prn   Cataract    Cataract    OU   Childhood asthma    Chronic diastolic CHF (congestive heart failure) (HCC)    Complete heart block (HCC)    Epigastric hernia 200's   GERD  (gastroesophageal reflux disease)    Glaucoma suspect    History of blood transfusion    "14 w/1st pregnancy; 2 w/last C-section" (03/15/2013)   Hx of echocardiogram    a. Echo 4/16:  Mild focal basal septal hypertrophy, EF 60-65%, no RWMA, Mechanical MVR ok with mild central regurgitation and no perivalvular leak, small mobile density attached to valvular ring (1.5x1 cm) - post surgical changes vs SBE, mild LAE;   b. 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   Complex valvuloplasty including Cor-matrix ECM patch augmentation of anterior and lateral leaflets with 268mEdwards mc3 ring annuloplasty via right mini-thoracotomy approach   Severe mitral regurgitation 04/22/2013   Sinus tachycardia    SVT (supraventricular tachycardia) 03/16/2013   TIA (transient ischemic attack)    Tricuspid regurgitation     Past Surgical History:  Procedure Laterality Date   APPENDECTOMY  ~1978   BALLOON DILATION N/A 11/27/2020  Procedure: BALLOON DILATION;  Surgeon: Gatha Mayer, MD;  Location: [facility] ENDOSCOPY;  Service: Endoscopy;  Laterality: N/A;   BIOPSY  05/01/2020   Procedure: BIOPSY;  Surgeon: Milus Banister, MD;  Location: [facility] ENDOSCOPY;  Service: Endoscopy;;   [place] WITH BILATERAL TUBAL LIGATION  1999   COLONOSCOPY WITH PROPOFOL N/A 05/01/2020   Procedure: COLONOSCOPY WITH PROPOFOL;  Surgeon: Milus Banister, MD;  Location: [facility] ENDOSCOPY;  Service: Endoscopy;   Laterality: N/A;   ESOPHAGOGASTRODUODENOSCOPY (EGD) WITH PROPOFOL N/A 05/01/2020   Procedure: ESOPHAGOGASTRODUODENOSCOPY (EGD) WITH PROPOFOL;  Surgeon: Milus Banister, MD;  Location: [facility]COPY;  Service: Endoscopy;  Laterality: N/A;   ESOPHAGOGASTRODUODENOSCOPY (EGD) WITH PROPOFOL N/A 11/27/2020   Procedure: ESOPHAGOGASTRODUODENOSCOPY (EGD) WITH PROPOFOL;  Surgeon: Gatha Mayer, MD;  Location: [place];  Service: Endoscopy;  Laterality: N/A;   FEMORAL HERNIA REPAIR Right 05/23/2013   Procedure: HERNIA REPAIR FEMORAL;  Surgeon: Rexene Alberts, MD;  Location: [place];  Service: Open Heart Surgery;  Laterality: Right;   INTRAOPERATIVE TRANSESOPHAGEAL ECHOCARDIOGRAM N/A 05/23/2013   Procedure: INTRAOPERATIVE TRANSESOPHAGEAL ECHOCARDIOGRAM;  Surgeon: Rexene Alberts, MD;  Location: [place];  Service: Open Heart Surgery;  Laterality: N/A;   LAPAROSCOPIC CHOLECYSTECTOMY  2003   LEFT AND RIGHT HEART CATHETERIZATION WITH CORONARY ANGIOGRAM N/A 03/22/2013   Procedure: LEFT AND RIGHT HEART CATHETERIZATION WITH CORONARY ANGIOGRAM;  Surgeon: Burnell Blanks, MD;  Location: [facility] CATH LAB;  Service: Cardiovascular;  Laterality: N/A;   MINIMALLY INVASIVE TRICUSPID VALVE REPAIR Right 05/23/2013   Procedure: MINIMALLY INVASIVE TRICUSPID VALVE REPAIR;  Surgeon: Rexene Alberts, MD;  Location: [place];  Service: Open Heart Surgery;  Laterality: Right;   MITRAL VALVE REPLACEMENT N/A 05/23/2013   Procedure: MITRAL VALVE (MV) REPLACEMENT;  Surgeon: Rexene Alberts, MD;  Location: [place];  Service: Open Heart Surgery;  Laterality: N/A;   MULTIPLE EXTRACTIONS WITH ALVEOLOPLASTY N/A 04/04/2013   Procedure: Extraction of tooth #'s 1,8,9,13,14,15,23,24,25,26 with alveoloplasty and gross debridement of remaining teeth;  Surgeon: Lenn Cal, DDS;  Location: [place];  Service: Oral Surgery;  Laterality: N/A;   TEE WITHOUT CARDIOVERSION N/A 03/18/2013   Procedure: TRANSESOPHAGEAL ECHOCARDIOGRAM (TEE);  Surgeon: Larey Dresser, MD;  Location: [place];  Service: Cardiovascular;  Laterality: N/A;   TEE WITHOUT CARDIOVERSION N/A 06/17/2013   Procedure: TRANSESOPHAGEAL ECHOCARDIOGRAM (TEE);  Surgeon: Lelon Perla, MD;  Location: [facility] ENDOSCOPY;  Service: Cardiovascular;  Laterality: N/A;   TUBAL LIGATION  1999    Social History:   reports that she quit smoking about 9 years ago. Her smoking use included cigarettes. She has a 18.00 pack-year smoking history. She has never used smokeless tobacco. She reports current alcohol use. She reports that she does not use drugs.  Allergies  Allergen Reactions   Oxycodone Itching    Pt states no longer makes her itchy   Tape Rash and Other (See Comments)    THE ONLY TAPE THAT IS TOLERATED IS PAPER TAPE. EKG leads inflame the skin    Family History  Problem Relation Age of Onset   Kidney disease Mother        Had one Kidney removed   Stroke Father    Cancer Father    Sarcoidosis Sister    Heart attack Neg Hx    Colon cancer Neg Hx    Stomach cancer Neg Hx    Pancreatic  cancer Neg Hx    Esophageal cancer Neg Hx      Prior to Admission medications   Medication Sig Start Date End Date Taking? Authorizing Provider  cephALEXin (KEFLEX) 500 MG capsule Take 1 capsule (500 mg total) by mouth 3 (three) times daily. 09/15/21   Argentina Donovan, PA-C  folic acid (FOLVITE) 1 MG tablet Take 1 tablet (1 mg total) by mouth daily. 07/04/22   [name], MD  furosemide (LASIX) 20 MG tablet TAKE 1 TABLET (20 MG TOTAL) BY MOUTH DAILY. 12/24/20 12/24/21  [name], MD  gabapentin (NEURONTIN) 300 MG capsule Take 1 capsule (300 mg total) by mouth 3 (three) times daily. 06/28/22   Charlott Rakes, MD  Naphazoline HCl (CLEAR EYES OP) Place 1 drop into both eyes continuous as needed (dry/irritated eyes).    [provider]  pantoprazole (PROTONIX) 40 MG tablet Take 1 tablet by mouth twice daily 07/04/22   [name], MD  potassium chloride  (KLOR-CON) 10 MEQ tablet TAKE 1 TABLET (10 MEQ TOTAL) BY MOUTH DAILY. TO TAKE WITH FLUID PILL 12/24/20 12/24/21  [name], MD  thiamine 100 MG tablet Take 1 tablet (100 mg total) by mouth daily. 11/22/21   [name], MD  traMADol (ULTRAM) 50 MG tablet TAKE 1 TABLET BY MOUTH 1 TO 2 TIMES DAILY AS NEEDED FOR PAIN 11/17/21   [name], MD  vitamin B-12 (CYANOCOBALAMIN) 500 MCG tablet TAKE 500 MCG BY MOUTH DAILY. 11/22/21 11/22/22  [name], MD  warfarin (COUMADIN) 5 MG tablet TAKE 1 & 1/2 TO 2 (ONE & ONE-HALF TO TWO) TABLETS BY MOUTH ONCE DAILY AS DIRECTED BY  COUMADIN  CLINIC 06/01/22   Jerline Pain, MD    Physical Exam: Vitals:   07/19/22 0100 07/19/22 0115 07/19/22 0130 07/19/22 0215  BP: 125/84 135/79 132/88 134/75  Pulse: 94 95 98 95  Resp: '19 17 19 16  '$ Temp:      TempSrc:      SpO2: 95% 96% 98% 97%  Weight:      Height:        Constitutional: NAD, calm  Eyes: PERTLA, lids and conjunctivae normal ENMT: Mucous membranes are moist. Posterior pharynx clear of any exudate or lesions.   Neck: supple, no masses  Respiratory: no wheezing, no crackles. No accessory muscle use.  Cardiovascular: S1 & S2 heard, regular rate and rhythm. No extremity edema.   Abdomen: No distension, no tenderness, soft. Bowel sounds active.  Musculoskeletal: no clubbing / cyanosis. No joint deformity upper and lower extremities.    Skin: no significant rashes, lesions, ulcers. Warm, dry, well-perfused. Neurologic: CN 2-12 grossly intact. Moving all extremities. Alert and oriented.  Psychiatric: Pleasant. Cooperative.    Labs and Imaging on Admission: I have personally reviewed following labs and imaging studies  CBC: Recent Labs  Lab 07/19/22 0005  WBC 9.2  HGB 10.5*  HCT 32.6*  MCV 103.2*  PLT 127   Basic Metabolic Panel: Recent Labs  Lab 07/19/22 0005  NA 139  K 4.7  CL 105  CO2 20*  GLUCOSE 120*  BUN 21*  CREATININE 1.68*  CALCIUM 9.1  MG 2.0    GFR: Estimated Creatinine Clearance: 32.6 mL/min (A) (by C-G formula based on SCr of 1.68 mg/dL (H)). Liver Function Tests: No results for input(s): "AST", "ALT", "ALKPHOS", "BILITOT", "PROT", "ALBUMIN" in the last 168 hours. No results for input(s): "LIPASE", "AMYLASE" in the last 168 hours. No results for  input(s): "AMMONIA" in the last 168 hours. Coagulation Profile: Recent Labs  Lab 07/19/22 0005  INR 2.1*   Cardiac Enzymes: No results for input(s): "CKTOTAL", "CKMB", "CKMBINDEX", "TROPONINI" in the last 168 hours. BNP (last 3 results) No results for input(s): "PROBNP" in the last 8760 hours. HbA1C: No results for input(s): "HGBA1C" in the last 72 hours. CBG: No results for input(s): "GLUCAP" in the last 168 hours. Lipid Profile: No results for input(s): "CHOL", "HDL", "LDLCALC", "TRIG", "CHOLHDL", "LDLDIRECT" in the last 72 hours. Thyroid Function Tests: No results for input(s): "TSH", "T4TOTAL", "FREET4", "T3FREE", "THYROIDAB" in the last 72 hours. Anemia Panel: No results for input(s): "VITAMINB12", "FOLATE", "FERRITIN", "TIBC", "IRON", "RETICCTPCT" in the last 72 hours. Urine analysis:    Component Value Date/Time   COLORURINE YELLOW 07/19/2022 0005   APPEARANCEUR CLEAR 07/19/2022 0005   LABSPEC 1.012 07/19/2022 0005   PHURINE 6.0 07/19/2022 0005   GLUCOSEU NEGATIVE 07/19/2022 0005   HGBUR SMALL (A) 07/19/2022 0005   BILIRUBINUR NEGATIVE 07/19/2022 0005   KETONESUR NEGATIVE 07/19/2022 0005   PROTEINUR 100 (A) 07/19/2022 0005   UROBILINOGEN 0.2 06/04/2015 1533   NITRITE NEGATIVE 07/19/2022 0005   LEUKOCYTESUR NEGATIVE 07/19/2022 0005   Sepsis Labs: '@LABRCNTIP'$ (procalcitonin:4,lacticidven:4) )No results found for this or any previous visit (from the past 240 hour(s)).   Radiological Exams on Admission: DG Chest Port 1 View  Result Date: 07/19/2022 CLINICAL DATA:  Recent syncopal episode EXAM: PORTABLE CHEST 1 VIEW COMPARISON:  11/24/2020 FINDINGS: Cardiac  shadow is stable. Postsurgical changes are again noted. Lungs are clear bilaterally. No acute bony abnormality is seen. IMPRESSION: No acute abnormality noted. Electronically Signed   By: Inez Catalina M.D.   On: 07/19/2022 00:02    EKG: Independently reviewed. Sinus rhythm, PVC, 1st degree AV block, QTc 507.   Assessment/Plan   1. Syncope  - Presents with lightheadedness on standing and syncopal episodes witnessed by EMS  - She has hx of intermittent CHB and reportedly had HR drop to 30s with EMS but her worsening on standing more indicative of orthostasis and the associated N/V and flushed sensation suggest vasovagal etiology  - Continue cardiac monitoring, check orthostatic vitals (though she has already received fluid bolus), and update echocardiogram    2. Hypotension d/t hypovolemia  - SBP as low as 77 in ED  - No evidence for sepsis or bleeding, no chest pain  - BP normalized and pt feels better after 1 liter NS in ED  - Continue IVF hydration and close monitoring    3. AKI superimposed on CKD IIIa  - SCr is 1.68 on admission, up from 1.34 earlier this month   - Likely prerenal in setting of hypovolemia and hypotension  - Continue IVF hydration, renally-dose medications, monitor    4. Mechanical mitral valve  - Continue warfarin    5. Hx intermittent CHB  - Initially noted post-operatively from MV replacment and TV repair in 2014, then again on Holter monitoring  - She last saw EP in March 2021 and PPM was not felt to be indicated  - Avoid nodal blockers, continue cardiac monitoring for now in setting of syncope   6. Prolonged QT interval  - QTc is 507 ms in ED  - Potassium is 4.7 and mag 2 in ED  - Continue cardiac monitoring, avoid QT-prolonging medications    DVT prophylaxis: warfarin  Code Status: Full  Level of Care: Level of care: Telemetry Cardiac Family Communication: None present  Disposition Plan:  Patient is from:  home  Anticipated d/c is to: Home   Anticipated d/c date is: Possibly as early as 07/20/22  Patient currently: Pending echocardiogram, cardiac monitoring, improved or stable renal function, stable BP  Consults called: none  Admission status: Observation     Vianne Bulls, MD [facility]  07/19/2022, 3:31 AM

## 2022-07-19 NOTE — ED Provider Notes (Signed)
[facility] Emergency Department Provider Note MRN:  [id number]  Arrival date & time: 07/19/22     Chief Complaint   Loss of Consciousness   History of Present Illness   Mary [name] is a 61 y.o. year-old female with a history of mechanical mitral valve, CHF, intermittent heart block presenting to the ED with chief complaint of loss of consciousness.  Patient had 2 syncopal episodes witnessed by EMS, called for lightheadedness or dizziness.  Described as a lightheadedness but also described as a room spinning sensation.  Worse when standing up.  No chest pain, no shortness of breath, no recent leg pain or swelling, no abdominal pain, no fever, no other complaints.  No injuries from the syncopal events.  Review of Systems  A thorough review of systems was obtained and all systems are negative except as noted in the HPI and PMH.   Patient's Health History    Past Medical History:  Diagnosis Date   Abnormal liver function    Acute diastolic heart failure ([place]) 03/15/2013   Alcoholism ([place])    Anemia    B12 deficiency    Carotid stenosis    Carotid US 2/17: bilateral ICA 1-39% >> FU prn   Cataract    Cataract    OU   Childhood asthma    Chronic diastolic CHF (congestive heart failure) (HCC)    Complete heart block (HCC)    Epigastric hernia 200's   GERD (gastroesophageal reflux disease)    Glaucoma suspect    History of blood transfusion    "14 w/1st pregnancy; 2 w/last C-section" (03/15/2013)   Hx of echocardiogram    a. Echo 4/16:  Mild focal basal septal hypertrophy, EF 60-65%, no RWMA, Mechanical MVR ok with mild central regurgitation and no perivalvular leak, small mobile density attached to valvular ring (1.5x1 cm) - post surgical changes vs SBE, mild LAE;   b. 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   Complex valvuloplasty including Cor-matrix ECM patch augmentation of anterior and lateral leaflets with 241mEdwards mc3 ring annuloplasty via right mini-thoracotomy approach   Severe mitral regurgitation 04/22/2013   Sinus tachycardia    SVT (supraventricular tachycardia) 03/16/2013   TIA (transient ischemic attack)    Tricuspid regurgitation     Past Surgical History:  Procedure Laterality Date   APPENDECTOMY  ~1978   BALLOON DILATION N/A 11/27/2020   Procedure: BALLOON DILATION;  Surgeon: GeGatha MayerMD;  [facility];  Laterality: N/A;   BIOPSY  05/01/2020   Procedure: BIOPSY;  Surgeon: JaMilus BanisterMD;  [facility];  Service: Endoscopy;;   [place]  1999   COLONOSCOPY WITH PROPOFOL N/A 05/01/2020   Procedure: COLONOSCOPY WITH PROPOFOL;  Surgeon: JaMilus BanisterMD;  [facility];  Service: Endoscopy;  Laterality: N/A;   ESOPHAGOGASTRODUODENOSCOPY (  EGD) WITH PROPOFOL N/A 05/01/2020   Procedure: ESOPHAGOGASTRODUODENOSCOPY (EGD) WITH PROPOFOL;  Surgeon: Milus Banister, MD;  Location[facility] ENDOSCOPY;  Service: Endoscopy;  Laterality: N/A;   ESOPHAGOGASTRODUODENOSCOPY (EGD) WITH PROPOFOL N/A 11/27/2020   Procedure: ESOPHAGOGASTRODUODENOSCOPY (EGD) WITH PROPOFOL;  Surgeon: Gatha Mayer, MD;  Location: [place];  Service: Endoscopy;  Laterality: N/A;   FEMORAL HERNIA REPAIR Right 05/23/2013   Procedure: HERNIA REPAIR FEMORAL;  Surgeon: Rexene Alberts, MD;  Location: [place];  Service: Open Heart Surgery;   Laterality: Right;   INTRAOPERATIVE TRANSESOPHAGEAL ECHOCARDIOGRAM N/A 05/23/2013   Procedure: INTRAOPERATIVE TRANSESOPHAGEAL ECHOCARDIOGRAM;  Surgeon: Rexene Alberts, MD;  Location: [place];  Service: Open Heart Surgery;  Laterality: N/A;   LAPAROSCOPIC CHOLECYSTECTOMY  2003   LEFT AND RIGHT HEART CATHETERIZATION WITH CORONARY ANGIOGRAM N/A 03/22/2013   Procedure: LEFT AND RIGHT HEART CATHETERIZATION WITH CORONARY ANGIOGRAM;  Surgeon: Burnell Blanks, MD;  Location: [facility] CATH LAB;  Service: Cardiovascular;  Laterality: N/A;   MINIMALLY INVASIVE TRICUSPID VALVE REPAIR Right 05/23/2013   Procedure: MINIMALLY INVASIVE TRICUSPID VALVE REPAIR;  Surgeon: Rexene Alberts, MD;  Location: [place];  Service: Open Heart Surgery;  Laterality: Right;   MITRAL VALVE REPLACEMENT N/A 05/23/2013   Procedure: MITRAL VALVE (MV) REPLACEMENT;  Surgeon: Rexene Alberts, MD;  Location: [place];  Service: Open Heart Surgery;  Laterality: N/A;   MULTIPLE EXTRACTIONS WITH ALVEOLOPLASTY N/A 04/04/2013   Procedure: Extraction of tooth #'s 1,8,9,13,14,15,23,24,25,26 with alveoloplasty and gross debridement of remaining teeth;  Surgeon: Lenn Cal, DDS;  Location: [place];  Service: Oral Surgery;  Laterality: N/A;   TEE WITHOUT CARDIOVERSION N/A 03/18/2013   Procedure: TRANSESOPHAGEAL ECHOCARDIOGRAM (TEE);  Surgeon: Larey Dresser, MD;  Location: [place];  Service: Cardiovascular;  Laterality: N/A;   TEE WITHOUT CARDIOVERSION N/A 06/17/2013   Procedure: TRANSESOPHAGEAL ECHOCARDIOGRAM (TEE);  Surgeon: Lelon Perla, MD;  Location: [facility] ENDOSCOPY;  Service: Cardiovascular;  Laterality: N/A;   TUBAL LIGATION  1999    Family History  Problem Relation Age of Onset   Kidney disease Mother        Had one Kidney removed   Stroke Father    Cancer Father    Sarcoidosis Sister    Heart attack Neg Hx    Colon cancer Neg Hx    Stomach cancer Neg Hx    Pancreatic cancer Neg Hx    Esophageal cancer Neg Hx     Social History    Socioeconomic History   Marital status: Single    Spouse name: Not on file   Number of children: Not on file   Years of education: Not on file   Highest education level: Not on file  Occupational History   Not on file  Tobacco Use   Smoking status: Former    Packs/day: 0.50    Years: 36.00    Total pack years: 18.00    Types: Cigarettes    Quit date: 03/15/2013    Years since quitting: 9.3   Smokeless tobacco: Never  Vaping Use   Vaping Use: Never used  Substance and Sexual Activity   Alcohol use: Yes    Comment:  "glass of wine"   Drug use: No   Sexual activity: Never  Other Topics Concern   Not on file  Social History Narrative   Right handed   Two story home apartment    Social Determinants of Health   Financial Resource Strain: Not on file  Food Insecurity: Not on  file  Transportation Needs: Not on file  Physical Activity: Not on file  Stress: Not on file  Social Connections: Not on file  Intimate Partner Violence: Not on file     Physical Exam   Vitals:   07/19/22 0130 07/19/22 0215  BP: 132/88 134/75  Pulse: 98 95  Resp: 19 16  Temp:    SpO2: 98% 97%    CONSTITUTIONAL: ill-appearing NEURO/PSYCH:  Alert and oriented x 3, no focal deficits EYES:  eyes equal and reactive ENT/NECK:  no LAD, no JVD CARDIO: Regular rate, well-perfused, normal S1 and S2 PULM:  CTAB no wheezing or rhonchi GI/GU:  non-distended, non-tender MSK/SPINE:  No gross deformities, no edema SKIN:  no rash, atraumatic   *Additional and/or pertinent findings included in MDM below  Diagnostic and Interventional Summary    EKG Interpretation  Date/Time:  Monday July 18 2022 23:40:19 EDT Ventricular Rate:  83 PR Interval:  256 QRS Duration: 97 QT Interval:  431 QTC Calculation: 507 R Axis:   -1 Text Interpretation: Sinus rhythm Ventricular premature complex Prolonged PR interval Borderline prolonged QT interval Confirmed by Gerlene Fee [phone]) on 07/19/2022 12:08:32  AM       Labs Reviewed  CBC - Abnormal; Notable for the following components:      Result Value   RBC 3.16 (*)    Hemoglobin 10.5 (*)    HCT 32.6 (*)    MCV 103.2 (*)    All other components within normal limits  BASIC METABOLIC PANEL - Abnormal; Notable for the following components:   CO2 20 (*)    Glucose, Bld 120 (*)    BUN 21 (*)    Creatinine, Ser 1.68 (*)    GFR, Estimated 35 (*)    All other components within normal limits  PROTIME-INR - Abnormal; Notable for the following components:   Prothrombin Time 23.3 (*)    INR 2.1 (*)    All other components within normal limits  ETHANOL  MAGNESIUM  URINALYSIS, ROUTINE W REFLEX MICROSCOPIC  TROPONIN I (HIGH SENSITIVITY)  TROPONIN I (HIGH SENSITIVITY)    DG Chest Port 1 View  Final Result      Medications  sodium chloride 0.9 % bolus 1,000 mL (0 mLs Intravenous Stopped 07/19/22 0212)     Procedures  /  Critical Care .1-3 Lead EKG Interpretation  Performed by: Maudie Flakes, MD Authorized by: Maudie Flakes, MD     Interpretation: normal     ECG rate:  90s   ECG rate assessment: normal     Rhythm: sinus rhythm     Ectopy: none     Conduction: normal   Comments:     Cardiac monitoring was ordered to monitor the patient for dysrhythmia.  I personally interpreted the patient's cardiac monitor while at the bedside.     ED Course and Medical Decision Making  Initial Impression and Ddx Patient initially appears unwell, seems very nauseated, initial blood pressure is soft with a systolic of 06/30.  Differential diagnosis includes dehydration, cardiac arrhythmia, valvular heart failure, vertigo (CNS and peripheral causes) also considered.  Patient is warm and well-perfused and so doubt cardiogenic shock, no fever, doubt sepsis.  Report of alcohol use this evening, will monitor closely, provide fluids, evaluate labs.  Past medical/surgical history that increases complexity of ED encounter: Mechanical heart valve,  intermittent heart block  Interpretation of Diagnostics I personally reviewed the EKG and my interpretation is as follows: Sinus rhythm without significant change from  prior  Labs reveal mild elevation in creatinine, BUN.  Otherwise no significant blood count or electrolyte disturbance.  Patient Reassessment and Ultimate Disposition/Management     Patient feeling better after fluids, will request observation admission for telemetry.  Patient management required discussion with the following services or consulting groups:  Hospitalist Service  Complexity of Problems Addressed Acute illness or injury that poses threat of life of bodily function  Additional Data Reviewed and Analyzed Further history obtained from: None  Additional Factors Impacting ED Encounter Risk Consideration of hospitalization  Barth Kirks. [place] Small, [place] [email or web address]  Final Clinical Impressions(s) / ED Diagnoses     ICD-10-CM   1. AKI (acute kidney injury) ([place])  N17.9     2. Syncope, unspecified syncope type  R55       ED Discharge Orders     None        Discharge Instructions Discussed with and Provided to Patient:   Discharge Instructions   None      Maudie Flakes, MD 07/19/22 [phone]

## 2022-07-19 NOTE — Discharge Instructions (Signed)
Advised to follow-up with primary care physician in 1 week. Advised to follow-up with cardiology as scheduled. Advised to continue current medications.

## 2022-07-19 NOTE — Progress Notes (Signed)
ANTICOAGULATION CONSULT NOTE - Initial Consult  Pharmacy Consult for Warfarin  Indication:  mechanical mitral valve  Allergies  Allergen Reactions   Oxycodone Itching    Pt states no longer makes her itchy   Tape Rash and Other (See Comments)    THE ONLY TAPE THAT IS TOLERATED IS PAPER TAPE. EKG leads inflame the skin    Patient Measurements: Height: '5\' 2"'$  (157.5 cm) Weight: 68 kg (150 lb) IBW/kg (Calculated) : 50.1  Vital Signs: Temp: 97.7 F (36.5 C) (10/24 0006) Temp Source: Oral (10/24 0006) BP: 134/75 (10/24 0215) Pulse Rate: 95 (10/24 0215)  Labs: Recent Labs    07/19/22 0005  HGB 10.5*  HCT 32.6*  PLT 285  LABPROT 23.3*  INR 2.1*  CREATININE 1.68*  TROPONINIHS 9    Estimated Creatinine Clearance: 32.6 mL/min (A) (by C-G formula based on SCr of 1.68 mg/dL (H)).   Medical History: Past Medical History:  Diagnosis Date   Abnormal liver function    Acute diastolic heart failure ([place]) 03/15/2013   Alcoholism ([place])    Anemia    B12 deficiency    Carotid stenosis    Carotid US 2/17: bilateral ICA 1-39% >> FU prn   Cataract    Cataract    OU   Childhood asthma    Chronic diastolic CHF (congestive heart failure) (HCC)    Complete heart block (HCC)    Epigastric hernia 200's   GERD (gastroesophageal reflux disease)    Glaucoma suspect    History of blood transfusion    "14 w/1st pregnancy; 2 w/last C-section" (03/15/2013)   Hx of echocardiogram    a. Echo 4/16:  Mild focal basal septal hypertrophy, EF 60-65%, no RWMA, Mechanical MVR ok with mild central regurgitation and no perivalvular leak, small mobile density attached to valvular ring (1.5x1 cm) - post surgical changes vs SBE, mild LAE;   b. 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 On warfarin PTA for mechanical MVR. INR is sub-therapeutic at 2.1 (goal 2.5-3.5). Other labs above reviewed.   Warfarin PTA dose: 7.5 mg daily   Goal of Therapy:  INR 2.5-3.5 per outpatient anti-coag notes Monitor platelets by anticoagulation protocol: Yes   Plan:  Warfarin 10 mg PO x 1 today at 1600 Daily PT/INR Monitor for bleeding Consider Lovenox bridge if INR drops any further   JaNarda BondsPharmD, BCPS Clinical Pharmacist Phone: [phone]

## 2022-07-19 NOTE — Discharge Summary (Signed)
Physician Discharge Summary  GAYTHA RAYBOURN UXL:[id number] DOB: [birth year] DOA: 07/18/2022  PCP: [name], MD  Admit date: 07/18/2022  Discharge date: 07/19/2022  Admitted From: Home.  Disposition:  Home  Recommendations for Outpatient Follow-up:  Follow up with PCP in 1-2 weeks. Please obtain BMP/CBC in one week Advised to follow-up with cardiology as scheduled. Advised to continue current medications.  Home Health: None Equipment/Devices:None  Discharge Condition: Stable CODE STATUS:Full code Diet recommendation: Heart Healthy  Brief [facility] Course: This 61 y.o. 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 Patient reports that she had been in her usual state of health yesterday until developing lightheadedness in the evening that was worse on standing.  She has history of excessive alcohol use, had stopped drinking completely in 2021, but then had 5 drinks last night.   EMS was called and observed syncopal episode when they attempted to stand the patient up.EMS reports that heart rate dipped to the 30s at that time. She denies any associated chest pain or palpitations but was experiencing nausea with nonbloody emesis.   She was admitted for overnight observation.  EKG features sinus rhythm with PVC, first-degree AV nodal block, and prolonged QT interval.  Chest x-ray is negative for acute cardiopulmonary disease.  Blood work notable for creatinine 1.68, hemoglobin 10.5, INR 2.1, and normal initial troponin.  Patient was continued on IV hydration.  Syncopal episode could be due to vasovagal.  Echocardiogram shows LVEF 55 to 60%.  Patient's seems much improved.  She has been ambulating in the room without any problem. Patient states she wants to be discharged and she has a primary care appointment on Friday  07/21/2022.  Patient is being discharged home and she will follow-up with primary care physician.   Discharge Diagnoses:  Principal Problem:   Syncope Active Problems:   S/P mitral valve replacement with metallic valve   (HFpEF) heart failure with preserved ejection fraction (HCC)   Acute renal failure superimposed on stage 3a chronic kidney disease (HCC)   Intermittent complete heart block (HCC)   Hypotension due to hypovolemia   Prolonged QT interval  Discharge Instructions  Discharge Instructions     Call MD for:  difficulty breathing, headache or visual disturbances   Complete by: As directed    Call MD for:  persistant dizziness or light-headedness   Complete by: As directed    Call MD for:  persistant nausea and vomiting   Complete by: As directed    Diet - low sodium heart healthy   Complete by: As directed    Diet Carb Modified   Complete by: As directed    Discharge instructions   Complete by: As directed    Advised to follow-up with primary care physician in 1 week. Advised to follow-up with cardiology as scheduled. Advised to continue current medications.   Increase activity slowly   Complete by: As directed       Allergies as of 07/19/2022       Reactions   Oxycodone Itching   Pt states no longer makes her itchy   Tape Itching, Rash, Other (See Comments)   THE ONLY TAPE THAT IS TOLERATED IS PAPER TAPE. EKG leads inflame the skin        Medication List     STOP taking these medications    potassium chloride 10 MEQ tablet Commonly  known as: KLOR-CON   traMADol 50 MG tablet Commonly known as: ULTRAM       TAKE these medications    acetaminophen 500 MG tablet Commonly known as: TYLENOL Take 1,000 mg by mouth in the morning, at noon, and at bedtime.   CLEAR EYES OP Place 2 drops into both eyes as needed (dry/irritated eyes).   folic acid 1 MG tablet Commonly known as: FOLVITE Take 1 tablet (1 mg total) by mouth daily.   furosemide 20  MG tablet Commonly known as: LASIX TAKE 1 TABLET (20 MG TOTAL) BY MOUTH DAILY. What changed:  how much to take when to take this   gabapentin 300 MG capsule Commonly known as: NEURONTIN Take 1 capsule (300 mg total) by mouth 3 (three) times daily.   pantoprazole 40 MG tablet Commonly known as: PROTONIX Take 1 tablet by mouth twice daily   thiamine 100 MG tablet Commonly known as: VITAMIN B1 Take 1 tablet (100 mg total) by mouth daily.   vitamin B-12 500 MCG tablet Commonly known as: CYANOCOBALAMIN TAKE 500 MCG BY MOUTH DAILY. What changed:  how much to take how to take this when to take this   warfarin 5 MG tablet Commonly known as: COUMADIN Take as directed. If you are unsure how to take this medication, talk to your nurse or doctor. Original instructions: TAKE 1 & 1/2 TO 2 (ONE & ONE-HALF TO TWO) TABLETS BY MOUTH ONCE DAILY AS DIRECTED BY  [facility] What changed: See the new instructions.        Follow-up Information     [name], MD Follow up in 1 week(s).   Specialty: Internal Medicine Contact information: [address] [place] [address] [phone]                Allergies  Allergen Reactions   Oxycodone Itching    Pt states no longer makes her itchy   Tape Itching, Rash and Other (See Comments)    THE ONLY TAPE THAT IS TOLERATED IS PAPER TAPE. EKG leads inflame the skin    Consultations: None   Procedures/Studies: ECHOCARDIOGRAM COMPLETE  Result Date: 07/19/2022    ECHOCARDIOGRAM REPORT   Patient Name:   Mary [name] Date of Exam: 07/19/2022 Medical Rec #:  [id number]       Height:       62.0 in Accession #:    [id number]      Weight:       150.0 lb Date of Birth:  09/15/1961      BSA:          1.692 m Patient Age:    61 years        BP:           115/7 mmHg Patient Gender: F               HR:           83 bpm. Exam Location:  Inpatient Procedure: 2D Echo, Cardiac Doppler and Color Doppler Indications:     Syncope  History:        Patient has prior history of Echocardiogram examinations, most                 recent 12/31/2019. TIA, Mitral Valve Disease and TV Disease,                 Arrythmias:SVT, Signs/Symptoms:Syncope; Risk Factors:Current  Smoker and ETOH abuse. Tricuspid Valve Repair: 26 mm Edwards mc3                 ring annuloplasty. Procedure date: 04/2013                  Mitral Valve Replacement: 33 mm [employer]in [employer] Optiform                 mechanical prosthesis valve is present in the mitral position.                 Procedure date: 04/2013  Sonographer:    [facility] RDCS (AE, PE) Referring Phys: [id number] TIMOTHY S OPYD  Sonographer Comments: No subcostal window. Pt could not tolerate pressure from probe. IMPRESSIONS  1. Left ventricular ejection fraction, by estimation, is 55 to 60%. The left ventricle has normal function. The left ventricle has no regional wall motion abnormalities. Left ventricular diastolic function could not be evaluated.  2. Right ventricular systolic function is normal. The right ventricular size is normal. Tricuspid regurgitation signal is inadequate for assessing PA pressure.  3. The mitral valve has been repaired/replaced. No evidence of mitral valve regurgitation. The mean mitral valve gradient is 4.5 mmHg with average heart rate of 82 bpm. There is a 33 mm [employer]in [employer] Optiform mechanical prosthesis present in the mitral position. Procedure Date: 05/23/2013. Echo findings are consistent with normal structure and function of the mitral valve prosthesis.  4. A small pericardial effusion is present. The pericardial effusion is circumferential.  5. The tricuspid valve is has been repaired/replaced. The tricuspid valve is status post repair with an annuloplasty ring.  6. The aortic valve is grossly normal. Aortic valve regurgitation is not visualized. No aortic stenosis is present.  7. The inferior vena cava is normal in size with greater than 50%  respiratory variability, suggesting right atrial pressure of 3 mmHg. Comparison(s): No significant change from prior study. Normal MV prosthesis. Stable TV repair. Unchanged normal biventricular function. FINDINGS  Left Ventricle: Left ventricular ejection fraction, by estimation, is 55 to 60%. The left ventricle has normal function. The left ventricle has no regional wall motion abnormalities. The left ventricular internal cavity size was normal in size. There is  no left ventricular hypertrophy. Abnormal (paradoxical) septal motion consistent with post-operative status. Left ventricular diastolic function could not be evaluated due to mitral valve replacement. Left ventricular diastolic function could not be evaluated. Right Ventricle: The right ventricular size is normal. No increase in right ventricular wall thickness. Right ventricular systolic function is normal. Tricuspid regurgitation signal is inadequate for assessing PA pressure. Left Atrium: Left atrial size was not well visualized. Right Atrium: Right atrial size was normal in size. Pericardium: A small pericardial effusion is present. The pericardial effusion is circumferential. Mitral Valve: The mitral valve has been repaired/replaced. No evidence of mitral valve regurgitation. There is a 33 mm [employer]in [employer] Optiform mechanical prosthesis present in the mitral position. Procedure Date: 05/23/2013. Echo findings are consistent with normal structure and function of the mitral valve prosthesis. MV peak gradient, 11.5 mmHg. The mean mitral valve gradient is 4.5 mmHg with average heart rate of 82 bpm. Tricuspid Valve: The tricuspid valve is has been repaired/replaced. Tricuspid valve regurgitation is trivial. No evidence of tricuspid stenosis. The tricuspid valve is status post repair with an annuloplasty ring. Aortic Valve: The aortic valve is grossly normal. Aortic valve regurgitation is not visualized. No aortic stenosis is present. Pulmonic Valve:  The pulmonic valve was  grossly normal. Pulmonic valve regurgitation is not visualized. No evidence of pulmonic stenosis. Aorta: The aortic root and ascending aorta are structurally normal, with no evidence of dilitation. Venous: The inferior vena cava is normal in size with greater than 50% respiratory variability, suggesting right atrial pressure of 3 mmHg. IAS/Shunts: The interatrial septum was not well visualized.  LEFT VENTRICLE PLAX 2D LVIDd:         4.20 cm LVIDs:         3.60 cm LV PW:         1.10 cm LV IVS:        1.00 cm LVOT diam:     1.80 cm LV SV:         40 LV SV Index:   24 LVOT Area:     2.54 cm  LV Volumes (MOD) LV vol d, MOD A2C: 74.1 ml LV vol d, MOD A4C: 100.0 ml LV vol s, MOD A2C: 34.1 ml LV vol s, MOD A4C: 58.3 ml LV SV MOD A2C:     40.0 ml LV SV MOD A4C:     100.0 ml LV SV MOD BP:      44.7 ml RIGHT VENTRICLE TAPSE (M-mode): 1.9 cm LEFT ATRIUM              Index        RIGHT ATRIUM           Index LA diam:        4.00 cm  2.36 cm/m   RA Area:     18.30 cm LA Vol (A2C):   140.0 ml 82.76 ml/m  RA Volume:   57.00 ml  33.69 ml/m LA Vol (A4C):   122.0 ml 72.12 ml/m LA Biplane Vol: 131.0 ml 77.44 ml/m  AORTIC VALVE LVOT Vmax:   77.30 cm/s LVOT Vmean:  60.900 cm/s LVOT VTI:    0.158 m  AORTA Ao Root diam: 2.70 cm MITRAL VALVE              TRICUSPID VALVE MV Area (PHT): 4.00 cm   TV Peak grad:   5.0 mmHg MV Area VTI:   1.53 cm   TV Mean grad:   2.0 mmHg MV Peak grad:  11.5 mmHg  TV Vmax:        1.12 m/s MV Mean grad:  4.5 mmHg   TV Vmean:       67.5 cm/s MV Vmax:       1.69 m/s   TV VTI:         0.29 msec MV Vmean:      97.5 cm/s MV VTI:        0.26 m     SHUNTS                           Systemic VTI:  0.16 m                           Systemic Diam: 1.80 cm Eleonore Chiquito MD Electronically signed by Eleonore Chiquito MD Signature Date/Time: 07/19/2022/9:21:18 AM    Final    DG Chest Port 1 View  Result Date: 07/19/2022 CLINICAL DATA:  Recent syncopal episode EXAM: PORTABLE CHEST 1 VIEW  COMPARISON:  11/24/2020 FINDINGS: Cardiac shadow is stable. Postsurgical changes are again noted. Lungs are clear bilaterally. No acute bony abnormality is seen. IMPRESSION: No acute abnormality noted. Electronically Signed   By:  Inez Catalina M.D.   On: 07/19/2022 00:02     Subjective: Patient was seen and examined at bedside.  Overnight events noted.   Patient seems much improved and wants to be discharged.  Patient being discharged home.  Discharge Exam: Vitals:   07/19/22 1415 07/19/22 1530  BP: (!) 143/81 (!) 142/78  Pulse: (!) 105 100  Resp: 18 15  Temp:  98 F (36.7 C)  SpO2: 94% 96%   Vitals:   07/19/22 1323 07/19/22 1330 07/19/22 1415 07/19/22 1530  BP:  (!) 139/106 (!) 143/81 (!) 142/78  Pulse:  98 (!) 105 100  Resp:  '19 18 15  '$ Temp: 98 F (36.7 C)   98 F (36.7 C)  TempSrc: Oral   Oral  SpO2:  98% 94% 96%  Weight:      Height:        General: Pt is alert, awake, not in acute distress Cardiovascular: RRR, S1/S2 +, no rubs, no gallops Respiratory: CTA bilaterally, no wheezing, no rhonchi Abdominal: Soft, NT, ND, bowel sounds + Extremities: no edema, no cyanosis    The results of significant diagnostics from this hospitalization (including imaging, microbiology, ancillary and laboratory) are listed below for reference.     Microbiology: No results found for this or any previous visit (from the past 240 hour(s)).   Labs: BNP (last 3 results) No results for input(s): "BNP" in the last 8760 hours. Basic Metabolic Panel: Recent Labs  Lab 07/19/22 0005  NA 139  K 4.7  CL 105  CO2 20*  GLUCOSE 120*  BUN 21*  CREATININE 1.68*  CALCIUM 9.1  MG 2.0   Liver Function Tests: No results for input(s): "AST", "ALT", "ALKPHOS", "BILITOT", "PROT", "ALBUMIN" in the last 168 hours. No results for input(s): "LIPASE", "AMYLASE" in the last 168 hours. No results for input(s): "AMMONIA" in the last 168 hours. CBC: Recent Labs  Lab 07/19/22 0005  WBC 9.2  HGB  10.5*  HCT 32.6*  MCV 103.2*  PLT 285   Cardiac Enzymes: No results for input(s): "CKTOTAL", "CKMB", "CKMBINDEX", "TROPONINI" in the last 168 hours. BNP: Invalid input(s): "POCBNP" CBG: No results for input(s): "GLUCAP" in the last 168 hours. D-Dimer No results for input(s): "DDIMER" in the last 72 hours. Hgb A1c No results for input(s): "HGBA1C" in the last 72 hours. Lipid Profile No results for input(s): "CHOL", "HDL", "LDLCALC", "TRIG", "CHOLHDL", "LDLDIRECT" in the last 72 hours. Thyroid function studies No results for input(s): "TSH", "T4TOTAL", "T3FREE", "THYROIDAB" in the last 72 hours.  Invalid input(s): "FREET3" Anemia work up No results for input(s): "VITAMINB12", "FOLATE", "FERRITIN", "TIBC", "IRON", "RETICCTPCT" in the last 72 hours. Urinalysis    Component Value Date/Time   COLORURINE YELLOW 07/19/2022 0005   APPEARANCEUR CLEAR 07/19/2022 0005   LABSPEC 1.012 07/19/2022 0005   PHURINE 6.0 07/19/2022 0005   GLUCOSEU NEGATIVE 07/19/2022 0005   HGBUR SMALL (A) 07/19/2022 0005   BILIRUBINUR NEGATIVE 07/19/2022 0005   KETONESUR NEGATIVE 07/19/2022 0005   PROTEINUR 100 (A) 07/19/2022 0005   UROBILINOGEN 0.2 06/04/2015 1533   NITRITE NEGATIVE 07/19/2022 0005   LEUKOCYTESUR NEGATIVE 07/19/2022 0005   Sepsis Labs Recent Labs  Lab 07/19/22 0005  WBC 9.2   Microbiology No results found for this or any previous visit (from the past 240 hour(s)).   Time coordinating discharge: Over 30 minutes  SIGNED:   Shawna Clamp, MD  [facility] 07/19/2022, 4:22 PM Pager   If 7PM-7AM, please contact night-coverage

## 2022-07-22 ENCOUNTER — Ambulatory Visit: Payer: Medicaid Other

## 2022-07-22 ENCOUNTER — Telehealth: Payer: Self-pay | Admitting: *Deleted

## 2022-07-22 NOTE — Telephone Encounter (Signed)
Called pt to r/s INR appt that she missed again. Was able to get her rescheduled.

## 2022-07-27 ENCOUNTER — Ambulatory Visit: Payer: Medicaid Other | Attending: Cardiology

## 2022-07-27 NOTE — Progress Notes (Deleted)
Patient ID: Mary [name], female   DOB: 1960-10-27, 61 y.o.   MRN: [id number]   Mary [name] is a 61 y.o. year-old female with a history of mechanical mitral valve, CHF, intermittent heart block presenting to the ED with chief complaint of loss of consciousness.   Patient had 2 syncopal episodes witnessed by EMS, called for lightheadedness or dizziness.  Described as a lightheadedness but also described as a room spinning sensation.  Worse when standing up.  No chest pain, no shortness of breath, no recent leg pain or swelling, no abdominal pain, no fever, no other complaints.  No injuries from the syncopal events.    Initial Impression and Ddx Patient initially appears unwell, seems very nauseated, initial blood pressure is soft with a systolic of 37/34.  Differential diagnosis includes dehydration, cardiac arrhythmia, valvular heart failure, vertigo (CNS and peripheral causes) also considered.  Patient is warm and well-perfused and so doubt cardiogenic shock, no fever, doubt sepsis.  Report of alcohol use this evening, will monitor closely, provide fluids, evaluate labs.   Past medical/surgical history that increases complexity of ED encounter: Mechanical heart valve, intermittent heart block   Interpretation of Diagnostics I personally reviewed the EKG and my interpretation is as follows: Sinus rhythm without significant change from prior   Labs reveal mild elevation in creatinine, BUN.  Otherwise no significant blood count or electrolyte disturbance.   Patient Reassessment and Ultimate Disposition/Management      Patient feeling better after fluids, will request observation admission for telemetry.   Patient management required discussion with the following services or consulting groups:  Hospitalist Service   Complexity of Problems Addressed Acute illness or injury that poses threat of life of bodily function   Additional Data Reviewed and Analyzed Further history obtained  from: None   Additional Factors Impacting ED Encounter Risk Consideration of hospitalization

## 2022-07-28 ENCOUNTER — Ambulatory Visit: Payer: Medicaid Other | Admitting: Physician Assistant

## 2022-08-22 ENCOUNTER — Telehealth: Payer: Self-pay | Admitting: *Deleted

## 2022-08-22 ENCOUNTER — Ambulatory Visit: Payer: Medicaid Other | Attending: Cardiology

## 2022-08-22 ENCOUNTER — Telehealth: Payer: Self-pay

## 2022-08-22 NOTE — Telephone Encounter (Signed)
Lpmtcb and reschedule INR appt.

## 2022-08-22 NOTE — Telephone Encounter (Signed)
Called pt since she missed another Anticoagulation Appt. Last seen 07/05/2022 and INR was 1.0; there was no answer so left her a message to call back. Will await.

## 2022-08-30 DIAGNOSIS — H462 Nutritional optic neuropathy: Secondary | ICD-10-CM | POA: Diagnosis not present

## 2022-08-30 DIAGNOSIS — H40013 Open angle with borderline findings, low risk, bilateral: Secondary | ICD-10-CM | POA: Diagnosis not present

## 2022-08-30 DIAGNOSIS — H25813 Combined forms of age-related cataract, bilateral: Secondary | ICD-10-CM | POA: Diagnosis not present

## 2022-08-30 DIAGNOSIS — H5213 Myopia, bilateral: Secondary | ICD-10-CM | POA: Diagnosis not present

## 2022-09-05 ENCOUNTER — Ambulatory Visit: Payer: Medicaid Other | Admitting: Cardiology

## 2022-09-05 NOTE — Progress Notes (Deleted)
Cardiology Office Note:    Date:  09/05/2022   ID:  Mary [name], DOB 23-Dec-1960, MRN [id number]  PCP:  [name], MD  [facility] Cardiologist:  Candee Furbish, MD  [facility] Electrophysiologist:  Cristopher Peru, MD   Referring MD: [name], MD     History of Present Illness:    Mary [name] is a 61 y.o. female here for a follow up mechanical value replacement, tricuspid valve repair 04/2013 Dr. Roxy Manns due to severe mitral regurgitation in the setting of bacterial endocarditis.  She had transient complete heart block without need for permanent pacemaker placement, Dr. Lovena Le.  Prior event monitor showed heart block during sleep.  She used to be seen by Dr. Saunders Revel and prior to that Dr. Aundra Dubin.   I love her saying "As long as its clicking, I'm kicking!"    She denies any chest pains, chest tightness, chest pressure, palpitations, shortness of breath, LE edema, dizziness, fatigue, lightheadedness, NVD or syncope.   Past Medical History:  Diagnosis Date   Abnormal liver function    Acute diastolic heart failure ([place]) 03/15/2013   Alcoholism ([place])    Anemia    B12 deficiency    Carotid stenosis    Carotid US 2/17: bilateral ICA 1-39% >> FU prn   Cataract    Cataract    OU   Childhood asthma    Chronic diastolic CHF (congestive heart failure) (HCC)    Complete heart block (HCC)    Epigastric hernia 200's   GERD (gastroesophageal reflux disease)    Glaucoma suspect    History of blood transfusion    "14 w/1st pregnancy; 2 w/last C-section" (03/15/2013)   Hx of echocardiogram    a. Echo 4/16:  Mild focal basal septal hypertrophy, EF 60-65%, no RWMA, Mechanical MVR ok with mild central regurgitation and no perivalvular leak, small mobile density attached to valvular ring (1.5x1 cm) - post surgical changes vs SBE, mild LAE;   b. 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    Protein calorie malnutrition (HCC)    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   S/P mitral valve replacement with metallic valve 09/28/7251   30m Sorin Carbomedics mechanical prosthesis via right mini thoracotomy approach   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   Complex valvuloplasty including Cor-matrix ECM patch augmentation of anterior and lateral leaflets with 228mEdwards mc3 ring annuloplasty via right mini-thoracotomy approach   Severe mitral regurgitation 04/22/2013   Sinus tachycardia    SVT (supraventricular tachycardia) 03/16/2013   TIA (transient ischemic attack)    Tricuspid regurgitation     Past Surgical History:  Procedure Laterality Date   APPENDECTOMY  ~1978   BALLOON DILATION N/A 11/27/2020   Procedure: BALLOON DILATION;  Surgeon: GeGatha MayerMD;  [facility] Service: Endoscopy;  Laterality: N/A;   BIOPSY  05/01/2020   Procedure: BIOPSY;  Surgeon: JaMilus BanisterMD;  [facility];  Service: Endoscopy;;   CADixITH BILATERAL TUBAL LIGATION  1999   COLONOSCOPY WITH PROPOFOL N/A 05/01/2020   Procedure: COLONOSCOPY WITH PROPOFOL;  Surgeon: JaMilus BanisterMD;  [facility]: [facility] ENDOSCOPY;  Service: Endoscopy;  Laterality: N/A;   ESOPHAGOGASTRODUODENOSCOPY (EGD) WITH PROPOFOL N/A 05/01/2020   Procedure: ESOPHAGOGASTRODUODENOSCOPY (EGD) WITH PROPOFOL;  Surgeon: Milus Banister, MD;  Location[facility]OSCOPY;  Service: Endoscopy;  Laterality: N/A;   ESOPHAGOGASTRODUODENOSCOPY (EGD) WITH PROPOFOL N/A 11/27/2020   Procedure: ESOPHAGOGASTRODUODENOSCOPY (EGD) WITH PROPOFOL;  Surgeon: Gatha Mayer, MD;  Location: [place];  Service: Endoscopy;  Laterality: N/A;   FEMORAL HERNIA REPAIR Right 05/23/2013   Procedure: HERNIA REPAIR FEMORAL;  Surgeon: Rexene Alberts, MD;  Location: [place];  Service: Open Heart Surgery;  Laterality: Right;   INTRAOPERATIVE TRANSESOPHAGEAL ECHOCARDIOGRAM N/A 05/23/2013   Procedure: INTRAOPERATIVE TRANSESOPHAGEAL ECHOCARDIOGRAM;  Surgeon: Rexene Alberts, MD;  Location: [place];  Service: Open Heart Surgery;  Laterality: N/A;   LAPAROSCOPIC CHOLECYSTECTOMY  2003   LEFT AND RIGHT HEART CATHETERIZATION WITH CORONARY ANGIOGRAM N/A 03/22/2013   Procedure: LEFT AND RIGHT HEART CATHETERIZATION WITH CORONARY ANGIOGRAM;  Surgeon: Burnell Blanks, MD;  Location: [facility] CATH LAB;  Service: Cardiovascular;  Laterality: N/A;   MINIMALLY INVASIVE TRICUSPID VALVE REPAIR Right 05/23/2013   Procedure: MINIMALLY INVASIVE TRICUSPID VALVE REPAIR;  Surgeon: Rexene Alberts, MD;  Location: [place];  Service: Open Heart Surgery;  Laterality: Right;   MITRAL VALVE REPLACEMENT N/A 05/23/2013   Procedure: MITRAL VALVE (MV) REPLACEMENT;  Surgeon: Rexene Alberts, MD;  Location: [place];  Service: Open Heart Surgery;  Laterality: N/A;   MULTIPLE EXTRACTIONS WITH ALVEOLOPLASTY N/A 04/04/2013   Procedure: Extraction of tooth #'s 1,8,9,13,14,15,23,24,25,26 with alveoloplasty and gross debridement of remaining teeth;  Surgeon: Lenn Cal, DDS;  Location: [place];  Service: Oral Surgery;  Laterality: N/A;   TEE WITHOUT CARDIOVERSION N/A 03/18/2013   Procedure: TRANSESOPHAGEAL ECHOCARDIOGRAM (TEE);  Surgeon: Larey Dresser, MD;  Location: [place];  Service: Cardiovascular;  Laterality: N/A;   TEE WITHOUT CARDIOVERSION N/A 06/17/2013   Procedure: TRANSESOPHAGEAL ECHOCARDIOGRAM (TEE);  Surgeon: Lelon Perla, MD;  Location: [facility] ENDOSCOPY;  Service: Cardiovascular;  Laterality: N/A;   TUBAL LIGATION  1999    Current Medications: No outpatient medications have been marked as taking for the 09/05/22 encounter (Appointment) with Jerline Pain, MD.     Allergies:   Oxycodone and Tape   Social History   Socioeconomic History   Marital status: Single     Spouse name: Not on file   Number of children: Not on file   Years of education: Not on file   Highest education level: Not on file  Occupational History   Not on file  Tobacco Use   Smoking status: Former    Packs/day: 0.50    Years: 36.00    Total pack years: 18.00    Types: Cigarettes    Quit date: 03/15/2013    Years since quitting: 9.4   Smokeless tobacco: Never  Vaping Use   Vaping Use: Never used  Substance and Sexual Activity   Alcohol use: Yes    Comment:  "glass of wine"   Drug use: No   Sexual activity: Never  Other Topics Concern   Not on file  Social History Narrative   Right handed   Two story home apartment    Social Determinants of Health   Financial Resource Strain: Not on file  Food Insecurity: Not on file  Transportation Needs: Not on file  Physical Activity: Not on file  Stress: Not on file  Social Connections: Not on file     Family History: The patient's family  history includes Cancer in her father; Kidney disease in her mother; Sarcoidosis in her sister; Stroke in her father. There is no history of Heart attack, Colon cancer, Stomach cancer, Pancreatic cancer, or Esophageal cancer.  ROS:   Please see the history of present illness.    All other systems reviewed and are negative.  EKGs/Labs/Other Studies Reviewed:    The following studies were reviewed today: Severe MR (in setting of SBE) June 2014 S/p R mini-thoracotomy with mechanical MVR and TV repair 6/14 Post op c/b CHB >> resolved; no pacemaker needed Echocardiogram 02/2019: normal EF, normally functioning MVR and TV repair Echo 12/2019: EF 55-60, stable MVR, TV repair Transient Complete Heart Block Holter 03/2014: asymptomatic CHB during sleep >> seen by EP Lovena Le); no PPM needed Holter 7/19: normal sinus rhythm, intermittent CHB during daytime >> seen by EP; PPM recommended; patient never called back to arrange Last seen by Dr. Lovena Le 11/2019: no pacer indicated  Hx of syncope  03/2019; ILR recommended; no insurance coverage S/p TIA in 05/2016 (sub therapeutic INR) Sinus tachycardia Beta blocker tried in 5/17 but stopped due to high grade heart block >> seen by EP; no PPM needed Cardiac Catheterization 2014:  No CAD Macrocytic anemia 2/2 B12 deficiency   Prior CV studies: Echocardiogram 07/19/22: 1. Left ventricular ejection fraction, by estimation, is 55 to 60%. The  left ventricle has normal function. The left ventricle has no regional  wall motion abnormalities. Left ventricular diastolic function could not  be evaluated.   2. Right ventricular systolic function is normal. The right ventricular  size is normal. Tricuspid regurgitation signal is inadequate for assessing  PA pressure.   3. The mitral valve has been repaired/replaced. No evidence of mitral  valve regurgitation. The mean mitral valve gradient is 4.5 mmHg with  average heart rate of 82 bpm. There is a 33 mm Sorin Carbomedics Optiform  mechanical prosthesis present in the  mitral position. Procedure Date: 05/23/2013. Echo findings are consistent  with normal structure and function of the mitral valve prosthesis.   4. A small pericardial effusion is present. The pericardial effusion is  circumferential.   5. The tricuspid valve is has been repaired/replaced. The tricuspid valve  is status post repair with an annuloplasty ring.   6. The aortic valve is grossly normal. Aortic valve regurgitation is not  visualized. No aortic stenosis is present.   7. The inferior vena cava is normal in size with greater than 50%  respiratory variability, suggesting right atrial pressure of 3 mmHg.   Comparison(s): No significant change from prior study. Normal MV  prosthesis. Stable TV repair. Unchanged normal biventricular function.       Holter 03/30/18 Normal sinus rhythm and sinus tachycardia with average heart rate 109 bpm. Heart rate ranged from 27 to 171 bpm. Intermittent complete heart block with slow  ventricular response at 47 bpm during the daytime. SVT at 171 bpm Occasional PVCs and wide-complex tachycardia up to 5 beats       Carotid US 9/17 Bilateral: soft plaque throughout CCA, ICA and ECA. 1-39% ICA plaquing. Vertebral artery flow is antegrade.      Cardiac cath 6/14 Left main: No obstructive disease.  Left Anterior Descending Artery:  No obstructive disease noted.   Circumflex Artery:   No obstructive disease.   Right Coronary Artery:  no obstructive disease.  Left Ventricular Angiogram: LVEF 65-70%. 3+ MR Distal Aortogram: No aneurysm. No stenosis.   EKG:   05/2021: no EKG was ordered today.  03/2020:sinus tachycardia rate 115 bpm  Recent Labs: 06/28/2022: ALT 11 07/19/2022: BUN 21; Creatinine, Ser 1.68; Hemoglobin 10.5; Magnesium 2.0; Platelets 285; Potassium 4.7; Sodium 139  Recent Lipid Panel    Component Value Date/Time   CHOL 189 05/31/2018 1108   TRIG 300 (H) 05/31/2018 1108   HDL 68 05/31/2018 1108   CHOLHDL 2.8 05/31/2018 1108   CHOLHDL 3.2 06/06/2016 0135   VLDL 22 06/06/2016 0135   LDLCALC 61 05/31/2018 1108    Physical Exam:    VS:  LMP 01/28/2013     Wt Readings from Last 3 Encounters:  07/18/22 150 lb (68 kg)  06/28/22 150 lb 9.6 oz (68.3 kg)  06/22/21 116 lb (52.6 kg)     GEN: Well nourished, well developed in no acute distress HEENT: Normal NECK: No JVD; No carotid bruits LYMPHATICS: No lymphadenopathy CARDIAC: Mechanical S1 RRR, no murmurs, rubs, gallops RESPIRATORY:  Clear to auscultation without rales, wheezing or rhonchi, mildly increased respiratory rate ABDOMEN: Soft, non-tender, non-distended MUSCULOSKELETAL:  No edema; No deformity  SKIN: Warm and dry NEUROLOGIC:  Alert and oriented x 3 PSYCHIATRIC:  Normal affect   ASSESSMENT:    No diagnosis found.   PLAN:    S/P mitral valve replacement with metallic valve Mechanical mitral valve.  Last INR therapeutic.  Followed closely by Coumadin clinic.  No bleeding.   Echocardiogram 2021 reassuring.  Personally reviewed.  Tricuspid valve repair Stable on echocardiogram reviewed as above.  No changes made.  Chronic diastolic heart failure Has had issues with shortness of breath which were felt to be multifactorial and exacerbated by tachycardia.  Was hesitant previously to proceed with right heart catheterization.  No CAD on 2014 cath.   Intermittent complete heart block ([place]) Has been seen in the past by electrophysiology, Dr. Lovena Le who did not feel that pacemaker was necessary in the situation.  Avoiding AV nodal blocking agents such as metoprolol.  Pulse today excellent at 102.  Prior EKG reviewed reassuring.   Warfarin anticoagulation Continue with close monitoring of INR by pharmacy team.  Notes reviewed.  Last hemoglobin 12.8, creatinine 1.1.  Excellent.  FOLLOW UP IN 1 YEAR  Medication Adjustments/Labs and Tests Ordered: Current medicines are reviewed at length with the patient today.  Concerns regarding medicines are outlined above.  No orders of the defined types were placed in this encounter.   No orders of the defined types were placed in this encounter.    Signed, Candee Furbish, MD  09/05/2022 8:04 AM    [facility]

## 2022-09-13 ENCOUNTER — Other Ambulatory Visit: Payer: Self-pay

## 2022-09-30 ENCOUNTER — Ambulatory Visit: Payer: Medicaid Other | Admitting: Internal Medicine

## 2022-10-03 DIAGNOSIS — H524 Presbyopia: Secondary | ICD-10-CM | POA: Diagnosis not present

## 2022-10-14 ENCOUNTER — Ambulatory Visit: Payer: Medicaid Other

## 2022-10-14 ENCOUNTER — Ambulatory Visit: Payer: Medicaid Other | Admitting: Family

## 2022-10-20 ENCOUNTER — Ambulatory Visit: Payer: Medicaid Other

## 2022-10-24 ENCOUNTER — Ambulatory Visit: Payer: Medicaid Other | Attending: Cardiovascular Disease

## 2022-10-24 DIAGNOSIS — Z7901 Long term (current) use of anticoagulants: Secondary | ICD-10-CM

## 2022-10-24 DIAGNOSIS — Z5181 Encounter for therapeutic drug level monitoring: Secondary | ICD-10-CM

## 2022-10-24 LAB — POCT INR: INR: 5.4 — AB (ref 2.0–3.0)

## 2022-10-24 NOTE — Patient Instructions (Signed)
HOLD TODAY, TUESDAY AND WEDNESDAY THEN continue taking warfarin 1.5 tablets daily. Resume normal leafy intake and ensure. Recheck INR in 1 week.  Be consistent with Ensure (1x week) & leafy veggies.  Coumadin Clinic. [phone] or [phone].   Bill [phone]

## 2022-10-31 ENCOUNTER — Ambulatory Visit: Payer: Medicaid Other

## 2022-11-02 ENCOUNTER — Other Ambulatory Visit: Payer: Self-pay | Admitting: Family Medicine

## 2022-11-02 DIAGNOSIS — G621 Alcoholic polyneuropathy: Secondary | ICD-10-CM

## 2022-11-06 ENCOUNTER — Other Ambulatory Visit: Payer: Self-pay | Admitting: Internal Medicine

## 2022-11-06 DIAGNOSIS — G629 Polyneuropathy, unspecified: Secondary | ICD-10-CM

## 2022-11-07 ENCOUNTER — Ambulatory Visit: Payer: Medicaid Other

## 2022-11-07 ENCOUNTER — Other Ambulatory Visit: Payer: Self-pay | Admitting: *Deleted

## 2022-11-07 DIAGNOSIS — Z7901 Long term (current) use of anticoagulants: Secondary | ICD-10-CM

## 2022-11-07 MED ORDER — WARFARIN SODIUM 5 MG PO TABS: ORAL_TABLET | ORAL | 0 refills | Status: DC

## 2022-11-07 NOTE — Telephone Encounter (Signed)
Prescription refill request received for warfarin Lov: 9/22 Next INR check: 2/19 Warfarin tablet strength: 92m   Pt is overdue to see cardiologist. On appointment note made note that pt needs to schedule appt with cardiologist.

## 2022-11-14 ENCOUNTER — Ambulatory Visit: Payer: Medicaid Other | Attending: Cardiovascular Disease | Admitting: *Deleted

## 2022-11-14 DIAGNOSIS — Z7901 Long term (current) use of anticoagulants: Secondary | ICD-10-CM | POA: Diagnosis not present

## 2022-11-14 LAB — POCT INR: INR: 8 — AB (ref 2.0–3.0)

## 2022-11-14 LAB — PROTIME-INR
INR: 8.9 (ref 0.9–1.2)
Prothrombin Time: 80.2 s — ABNORMAL HIGH (ref 9.1–12.0)

## 2022-11-14 NOTE — Patient Instructions (Signed)
Description   (BILL [id number]) 11/14/22@235pm$  STAT INR 8.9; Spoke with pt and advised not to take any warfarin today, no warfarin tomorrow, no warfarin Wednesday, no warfarin Thursday take 1.5 tablets on Friday and Saturday and 1 tablet Sunday then repeat INR on Monday.    Current dose is warfarin 1.5 tablets daily. Resume normal leafy intake and ensure. Be consistent with Ensure (1x week) & leafy veggies.  Coumadin Clinic. [phone] or [phone].

## 2022-11-21 ENCOUNTER — Ambulatory Visit: Payer: Medicaid Other

## 2022-11-22 ENCOUNTER — Ambulatory Visit: Payer: Medicaid Other | Admitting: Family

## 2022-11-28 ENCOUNTER — Ambulatory Visit: Payer: Medicaid Other | Attending: Cardiovascular Disease | Admitting: *Deleted

## 2022-11-28 DIAGNOSIS — Z7901 Long term (current) use of anticoagulants: Secondary | ICD-10-CM

## 2022-11-28 DIAGNOSIS — Z5181 Encounter for therapeutic drug level monitoring: Secondary | ICD-10-CM | POA: Diagnosis not present

## 2022-11-28 LAB — POCT INR: POC INR: 4.7

## 2022-11-28 NOTE — Patient Instructions (Signed)
Description   Hold warfarin today Tomorrow take 1 tablet Then START taking warfarin 1.5 tablets daily except for 1 tablet on Sunday and Thursday. Recheck INR in 2 weeks. Coumadin Clinic [phone]

## 2022-11-29 ENCOUNTER — Encounter: Payer: Self-pay | Admitting: Family

## 2022-11-29 ENCOUNTER — Ambulatory Visit (INDEPENDENT_AMBULATORY_CARE_PROVIDER_SITE_OTHER): Payer: Medicaid Other | Admitting: Family

## 2022-11-29 DIAGNOSIS — B351 Tinea unguium: Secondary | ICD-10-CM

## 2022-11-29 NOTE — Progress Notes (Signed)
Office Visit Note   Patient: Mary [name]           Date of Birth: 1960/12/04           MRN: [id number] Visit Date: 11/29/2022              Requested by: [name], MD [place],  [place] [id number] PCP: [name], MD  Chief Complaint  Patient presents with   Right Foot - Follow-up    Bilateral nail trimming   Left Foot - Follow-up      HPI: The patient is a 62 year old woman who presents today for initial evaluation of bilateral feet she is concerned she may have fungus on her toenails.  She is unfortunately no longer able to safely trim her own nails due to history of a CVA as well as neuropathy  Assessment & Plan: Visit Diagnoses: No diagnosis found.  Plan: Nails trimmed x 10.  Patient tolerated well.  She will follow-up in 3 months for reevaluation.  Follow-Up Instructions: No follow-ups on file.   Ortho Exam  Patient is alert, oriented, no adenopathy, well-dressed, normal affect, normal respiratory effort. On examination bilateral feet she does have thickened and discolored onychomycotic nails x 10 there are no calluses ulcers or signs of impending ulceration.  Nails were trimmed x 10 after informed consent.  Patient tolerated well.  Imaging: No results found. No images are attached to the encounter.  Labs: Lab Results  Component Value Date   HGBA1C 4.2 (L) 08/18/2020   HGBA1C 4.8 06/06/2016   HGBA1C 5.4 09/13/2015   ESRSEDRATE 14 08/18/2020   ESRSEDRATE 21 06/03/2020   CRP <0.5 08/18/2020   REPTSTATUS 08/23/2020 FINAL 08/19/2020   GRAMSTAIN  08/19/2020    WBC PRESENT, PREDOMINANTLY MONONUCLEAR NO ORGANISMS SEEN CYTOSPIN SMEAR    CULT  08/19/2020    NO GROWTH 3 DAYS Performed at [facility], [place] [address], [place], [place] [address]    LABORGA NO GROWTH 5 DAYS 05/09/2013   LABORGA NO GROWTH 5 DAYS 05/09/2013     Lab Results  Component Value Date   ALBUMIN 4.6 06/28/2022   ALBUMIN 2.4 (L)  01/30/2021   ALBUMIN 2.7 (L) 12/24/2020    Lab Results  Component Value Date   MG 2.0 07/19/2022   MG 1.4 (L) 12/03/2020   MG 1.7 11/28/2020   Lab Results  Component Value Date   VD25OH 6.4 (L) 06/03/2020    No results found for: "PREALBUMIN"    Latest Ref Rng & Units 07/19/2022   12:05 AM 01/30/2021    5:28 PM 12/24/2020   12:01 PM  CBC EXTENDED  WBC 4.0 - 10.5 K/uL 9.2  8.8  7.6   RBC 3.87 - 5.11 MIL/uL 3.16  3.65  2.68   Hemoglobin 12.0 - 15.0 g/dL 10.5  12.8  9.6   HCT 36.0 - 46.0 % 32.6  37.2  29.0   Platelets 150 - 400 K/uL 285  318  361   NEUT# 1.7 - 7.7 K/uL  7.1    Lymph# 0.7 - 4.0 K/uL  1.2       There is no height or weight on file to calculate BMI.  Orders:  No orders of the defined types were placed in this encounter.  No orders of the defined types were placed in this encounter.    Procedures: No procedures performed  Clinical Data: No additional findings.  ROS:  All other systems  negative, except as noted in the HPI. Review of Systems  Objective: Vital Signs: LMP 01/28/2013   Specialty Comments:  No specialty comments available.  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   Thiamin deficiency 09/01/2020   Paresthesia    Vitamin B12 deficiency 05/04/2020   Vomiting 04/24/2020   GERD (gastroesophageal reflux disease)    Marijuana abuse    Macrocytic anemia 04/04/2019   Intermittent complete heart block (HCC)    Syncope 03/28/2019   Orthostatic hypotension 01/11/2017   Elevated liver enzymes 01/11/2017   Atypical chest pain 06/05/2016   Acute renal failure superimposed on stage 3a chronic kidney disease ([place]) 01/10/2016   Elevated INR 01/10/2016   TIA (transient ischemic attack)  09/13/2015   CVA (cerebral vascular accident) ([place]) 09/12/2015   SOB (shortness of breath) 02/24/2014   First degree heart block 06/17/2013   (HFpEF) heart failure with preserved ejection fraction ([place]) 06/16/2013   History of bacterial endocarditis 06/16/2013   Warfarin anticoagulation 06/10/2013   S/P mitral valve replacement with metallic valve [id number]   Femoral hernia 05/23/2013   Mitral valve insufficiency 04/22/2013   Tricuspid regurgitation 04/22/2013   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   SVT (supraventricular tachycardia) ([place]) 03/16/2013   Past Medical History:  Diagnosis Date   Abnormal liver function    Acute diastolic heart failure ([place]) 03/15/2013   Alcoholism ([place])    Anemia    B12 deficiency    Carotid stenosis    Carotid US 2/17: bilateral ICA 1-39% >> FU prn   Cataract    Cataract    OU   Childhood asthma    Chronic diastolic CHF (congestive heart failure) (HCC)    Complete heart block (HCC)    Epigastric hernia 200's   GERD (gastroesophageal reflux disease)    Glaucoma suspect    History of blood transfusion    "14 w/1st pregnancy; 2 w/last C-section" (03/15/2013)   Hx of echocardiogram    a. Echo 4/16:  Mild focal basal septal hypertrophy, EF 60-65%, no RWMA, Mechanical MVR ok with mild central regurgitation and no perivalvular leak, small mobile density attached to valvular ring (1.5x1 cm) - post surgical changes vs SBE, mild LAE;   b. 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   S/P mitral valve replacement with metallic valve [id number]   44m Sorin Carbomedics mechanical prosthesis via right mini thoracotomy approach   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   Complex  valvuloplasty including Cor-matrix ECM patch augmentation of anterior and lateral leaflets with 257mEdwards mc3 ring annuloplasty via right mini-thoracotomy approach   Severe mitral regurgitation 04/22/2013   Sinus tachycardia    SVT (supraventricular tachycardia) 03/16/2013   TIA (transient ischemic attack)    Tricuspid regurgitation     Family History  Problem Relation Age of Onset   Kidney disease Mother        Had one Kidney removed  Stroke Father    Cancer Father    Sarcoidosis Sister    Heart attack Neg Hx    Colon cancer Neg Hx    Stomach cancer Neg Hx    Pancreatic cancer Neg Hx    Esophageal cancer Neg Hx     Past Surgical History:  Procedure Laterality Date   APPENDECTOMY  ~1978   BALLOON DILATION N/A 11/27/2020   Procedure: BALLOON DILATION;  Surgeon: Gatha Mayer, MD;  Location: [facility] ENDOSCOPY;  Service: Endoscopy;  Laterality: N/A;   BIOPSY  05/01/2020   Procedure: BIOPSY;  Surgeon: Milus Banister, MD;  Location: [facility] ENDOSCOPY;  Service: Endoscopy;;   [place] WITH BILATERAL TUBAL LIGATION  1999   COLONOSCOPY WITH PROPOFOL N/A 05/01/2020   Procedure: COLONOSCOPY WITH PROPOFOL;  Surgeon: Milus Banister, MD;  Location: [facility] ENDOSCOPY;  Service: Endoscopy;  Laterality: N/A;   ESOPHAGOGASTRODUODENOSCOPY (EGD) WITH PROPOFOL N/A 05/01/2020   Procedure: ESOPHAGOGASTRODUODENOSCOPY (EGD) WITH PROPOFOL;  Surgeon: Milus Banister, MD;  Location[facility] ENDOSCOPY;  Service: Endoscopy;  Laterality: N/A;   ESOPHAGOGASTRODUODENOSCOPY (EGD) WITH PROPOFOL N/A 11/27/2020   Procedure: ESOPHAGOGASTRODUODENOSCOPY (EGD) WITH PROPOFOL;  Surgeon: Gatha Mayer, MD;  Location: [place];  Service: Endoscopy;  Laterality: N/A;   FEMORAL HERNIA REPAIR Right 05/23/2013   Procedure: HERNIA REPAIR FEMORAL;  Surgeon: Rexene Alberts, MD;  Location: [place];  Service: Open Heart Surgery;  Laterality: Right;    INTRAOPERATIVE TRANSESOPHAGEAL ECHOCARDIOGRAM N/A 05/23/2013   Procedure: INTRAOPERATIVE TRANSESOPHAGEAL ECHOCARDIOGRAM;  Surgeon: Rexene Alberts, MD;  Location: [place];  Service: Open Heart Surgery;  Laterality: N/A;   LAPAROSCOPIC CHOLECYSTECTOMY  2003   LEFT AND RIGHT HEART CATHETERIZATION WITH CORONARY ANGIOGRAM N/A 03/22/2013   Procedure: LEFT AND RIGHT HEART CATHETERIZATION WITH CORONARY ANGIOGRAM;  Surgeon: Burnell Blanks, MD;  Location: [facility] CATH LAB;  Service: Cardiovascular;  Laterality: N/A;   MINIMALLY INVASIVE TRICUSPID VALVE REPAIR Right 05/23/2013   Procedure: MINIMALLY INVASIVE TRICUSPID VALVE REPAIR;  Surgeon: Rexene Alberts, MD;  Location: [place];  Service: Open Heart Surgery;  Laterality: Right;   MITRAL VALVE REPLACEMENT N/A 05/23/2013   Procedure: MITRAL VALVE (MV) REPLACEMENT;  Surgeon: Rexene Alberts, MD;  Location: [place];  Service: Open Heart Surgery;  Laterality: N/A;   MULTIPLE EXTRACTIONS WITH ALVEOLOPLASTY N/A 04/04/2013   Procedure: Extraction of tooth #'s 1,8,9,13,14,15,23,24,25,26 with alveoloplasty and gross debridement of remaining teeth;  Surgeon: Lenn Cal, DDS;  Location: [place];  Service: Oral Surgery;  Laterality: N/A;   TEE WITHOUT CARDIOVERSION N/A 03/18/2013   Procedure: TRANSESOPHAGEAL ECHOCARDIOGRAM (TEE);  Surgeon: Larey Dresser, MD;  Location: [place];  Service: Cardiovascular;  Laterality: N/A;   TEE WITHOUT CARDIOVERSION N/A 06/17/2013   Procedure: TRANSESOPHAGEAL ECHOCARDIOGRAM (TEE);  Surgeon: Lelon Perla, MD;  Location: [facility] ENDOSCOPY;  Service: Cardiovascular;  Laterality: N/A;   [place]   Social History   Occupational History   Not on file  Tobacco Use   Smoking status: Former    Packs/day: 0.50    Years: 36.00    Total pack years: 18.00    Types: Cigarettes    Quit date: 03/15/2013    Years since quitting: 9.7   Smokeless tobacco: Never  Vaping Use   Vaping Use: Never used  Substance and Sexual Activity    Alcohol use: Yes    Comment:  "glass  of wine"   Drug use: No   Sexual activity: Never

## 2022-12-05 ENCOUNTER — Encounter (HOSPITAL_COMMUNITY): Payer: Self-pay | Admitting: *Deleted

## 2022-12-05 ENCOUNTER — Observation Stay (HOSPITAL_COMMUNITY)
Admission: EM | Admit: 2022-12-05 | Discharge: 2022-12-09 | Disposition: A | Discharge status: Home / Self Care | Payer: Medicaid Other | Attending: Internal Medicine | Admitting: Internal Medicine

## 2022-12-05 ENCOUNTER — Emergency Department (HOSPITAL_COMMUNITY): Payer: Medicaid Other

## 2022-12-05 ENCOUNTER — Other Ambulatory Visit: Payer: Self-pay

## 2022-12-05 DIAGNOSIS — J029 Acute pharyngitis, unspecified: Principal | ICD-10-CM

## 2022-12-05 DIAGNOSIS — I5030 Unspecified diastolic (congestive) heart failure: Secondary | ICD-10-CM | POA: Diagnosis present

## 2022-12-05 DIAGNOSIS — I110 Hypertensive heart disease with heart failure: Secondary | ICD-10-CM | POA: Diagnosis not present

## 2022-12-05 DIAGNOSIS — R6884 Jaw pain: Secondary | ICD-10-CM | POA: Diagnosis present

## 2022-12-05 DIAGNOSIS — Z952 Presence of prosthetic heart valve: Secondary | ICD-10-CM | POA: Diagnosis not present

## 2022-12-05 DIAGNOSIS — I10 Essential (primary) hypertension: Secondary | ICD-10-CM | POA: Diagnosis not present

## 2022-12-05 DIAGNOSIS — R791 Abnormal coagulation profile: Secondary | ICD-10-CM | POA: Diagnosis not present

## 2022-12-05 DIAGNOSIS — G629 Polyneuropathy, unspecified: Secondary | ICD-10-CM

## 2022-12-05 DIAGNOSIS — K047 Periapical abscess without sinus: Secondary | ICD-10-CM | POA: Diagnosis not present

## 2022-12-05 DIAGNOSIS — R002 Palpitations: Secondary | ICD-10-CM | POA: Diagnosis not present

## 2022-12-05 DIAGNOSIS — M26609 Unspecified temporomandibular joint disorder, unspecified side: Secondary | ICD-10-CM | POA: Diagnosis not present

## 2022-12-05 DIAGNOSIS — Z79899 Other long term (current) drug therapy: Secondary | ICD-10-CM

## 2022-12-05 DIAGNOSIS — Z8673 Personal history of transient ischemic attack (TIA), and cerebral infarction without residual deficits: Secondary | ICD-10-CM | POA: Diagnosis not present

## 2022-12-05 DIAGNOSIS — E876 Hypokalemia: Secondary | ICD-10-CM | POA: Diagnosis present

## 2022-12-05 DIAGNOSIS — I5032 Chronic diastolic (congestive) heart failure: Secondary | ICD-10-CM | POA: Diagnosis not present

## 2022-12-05 DIAGNOSIS — E860 Dehydration: Secondary | ICD-10-CM | POA: Diagnosis present

## 2022-12-05 DIAGNOSIS — Z823 Family history of stroke: Secondary | ICD-10-CM

## 2022-12-05 DIAGNOSIS — K029 Dental caries, unspecified: Secondary | ICD-10-CM | POA: Diagnosis present

## 2022-12-05 DIAGNOSIS — R519 Headache, unspecified: Secondary | ICD-10-CM | POA: Diagnosis not present

## 2022-12-05 DIAGNOSIS — E875 Hyperkalemia: Secondary | ICD-10-CM

## 2022-12-05 DIAGNOSIS — Z1152 Encounter for screening for COVID-19: Secondary | ICD-10-CM | POA: Diagnosis not present

## 2022-12-05 DIAGNOSIS — R079 Chest pain, unspecified: Secondary | ICD-10-CM | POA: Diagnosis not present

## 2022-12-05 DIAGNOSIS — M542 Cervicalgia: Secondary | ICD-10-CM | POA: Diagnosis not present

## 2022-12-05 DIAGNOSIS — Z87891 Personal history of nicotine dependence: Secondary | ICD-10-CM | POA: Diagnosis not present

## 2022-12-05 DIAGNOSIS — Z7901 Long term (current) use of anticoagulants: Secondary | ICD-10-CM | POA: Diagnosis not present

## 2022-12-05 DIAGNOSIS — Z885 Allergy status to narcotic agent status: Secondary | ICD-10-CM

## 2022-12-05 DIAGNOSIS — R1310 Dysphagia, unspecified: Secondary | ICD-10-CM | POA: Diagnosis present

## 2022-12-05 DIAGNOSIS — M26602 Left temporomandibular joint disorder, unspecified: Secondary | ICD-10-CM | POA: Insufficient documentation

## 2022-12-05 DIAGNOSIS — I442 Atrioventricular block, complete: Secondary | ICD-10-CM | POA: Diagnosis not present

## 2022-12-05 DIAGNOSIS — Z954 Presence of other heart-valve replacement: Secondary | ICD-10-CM | POA: Diagnosis not present

## 2022-12-05 DIAGNOSIS — Z9049 Acquired absence of other specified parts of digestive tract: Secondary | ICD-10-CM

## 2022-12-05 DIAGNOSIS — R000 Tachycardia, unspecified: Secondary | ICD-10-CM

## 2022-12-05 DIAGNOSIS — F1290 Cannabis use, unspecified, uncomplicated: Secondary | ICD-10-CM | POA: Diagnosis present

## 2022-12-05 DIAGNOSIS — Z841 Family history of disorders of kidney and ureter: Secondary | ICD-10-CM

## 2022-12-05 LAB — RESP PANEL BY RT-PCR (RSV, FLU A&B, COVID)  RVPGX2
Influenza A by PCR: NEGATIVE
Influenza B by PCR: NEGATIVE
Resp Syncytial Virus by PCR: NEGATIVE
SARS Coronavirus 2 by RT PCR: NEGATIVE

## 2022-12-05 LAB — CBC WITH DIFFERENTIAL/PLATELET
Abs Immature Granulocytes: 0.04 10*3/uL (ref 0.00–0.07)
Basophils Absolute: 0 10*3/uL (ref 0.0–0.1)
Basophils Relative: 0 %
Eosinophils Absolute: 0.1 10*3/uL (ref 0.0–0.5)
Eosinophils Relative: 1 %
HCT: 40.2 % (ref 36.0–46.0)
Hemoglobin: 13.1 g/dL (ref 12.0–15.0)
Immature Granulocytes: 0 %
Lymphocytes Relative: 13 %
Lymphs Abs: 1.5 10*3/uL (ref 0.7–4.0)
MCH: 34.4 pg — ABNORMAL HIGH (ref 26.0–34.0)
MCHC: 32.6 g/dL (ref 30.0–36.0)
MCV: 105.5 fL — ABNORMAL HIGH (ref 80.0–100.0)
Monocytes Absolute: 0.6 10*3/uL (ref 0.1–1.0)
Monocytes Relative: 6 %
Neutro Abs: 8.8 10*3/uL — ABNORMAL HIGH (ref 1.7–7.7)
Neutrophils Relative %: 80 %
Platelets: 401 10*3/uL — ABNORMAL HIGH (ref 150–400)
RBC: 3.81 MIL/uL — ABNORMAL LOW (ref 3.87–5.11)
RDW: 14.6 % (ref 11.5–15.5)
WBC: 11.2 10*3/uL — ABNORMAL HIGH (ref 4.0–10.5)
nRBC: 0 % (ref 0.0–0.2)

## 2022-12-05 LAB — BASIC METABOLIC PANEL
Anion gap: 12 (ref 5–15)
BUN: 9 mg/dL (ref 6–20)
CO2: 20 mmol/L — ABNORMAL LOW (ref 22–32)
Calcium: 9.3 mg/dL (ref 8.9–10.3)
Chloride: 104 mmol/L (ref 98–111)
Creatinine, Ser: 1.18 mg/dL — ABNORMAL HIGH (ref 0.44–1.00)
GFR, Estimated: 53 mL/min — ABNORMAL LOW (ref >60)
Glucose, Bld: 107 mg/dL — ABNORMAL HIGH (ref 70–99)
Potassium: 5.7 mmol/L — ABNORMAL HIGH (ref 3.5–5.1)
Sodium: 136 mmol/L (ref 135–145)

## 2022-12-05 LAB — PROTIME-INR
INR: 5.1 (ref 0.8–1.2)
Prothrombin Time: 46.6 seconds — ABNORMAL HIGH (ref 11.4–15.2)

## 2022-12-05 LAB — TROPONIN I (HIGH SENSITIVITY)
Troponin I (High Sensitivity): 7 ng/L (ref <18)
Troponin I (High Sensitivity): 9 ng/L (ref <18)

## 2022-12-05 LAB — MAGNESIUM: Magnesium: 2 mg/dL (ref 1.7–2.4)

## 2022-12-05 LAB — POTASSIUM
Potassium: 5.4 mmol/L — ABNORMAL HIGH (ref 3.5–5.1)
Potassium: 5.4 mmol/L — ABNORMAL HIGH (ref 3.5–5.1)

## 2022-12-05 LAB — GROUP A STREP BY PCR: Group A Strep by PCR: NOT DETECTED

## 2022-12-05 MED ORDER — GABAPENTIN 300 MG PO CAPS
300.0000 mg | ORAL_CAPSULE | Freq: Once | ORAL | Status: AC
  Administered 2022-12-05: 300 mg via ORAL
  Filled 2022-12-05: qty 1

## 2022-12-05 MED ORDER — MORPHINE SULFATE (PF) 4 MG/ML IV SOLN
4.0000 mg | Freq: Once | INTRAVENOUS | Status: AC
  Administered 2022-12-05: 4 mg via INTRAVENOUS
  Filled 2022-12-05 (×2): qty 1

## 2022-12-05 MED ORDER — HYDROMORPHONE HCL 1 MG/ML IJ SOLN
1.0000 mg | Freq: Once | INTRAMUSCULAR | Status: AC
  Administered 2022-12-05: 1 mg via INTRAVENOUS
  Filled 2022-12-05: qty 1

## 2022-12-05 MED ORDER — MORPHINE SULFATE (PF) 4 MG/ML IV SOLN
4.0000 mg | Freq: Once | INTRAVENOUS | Status: AC
  Administered 2022-12-05: 4 mg via INTRAVENOUS
  Filled 2022-12-05: qty 1

## 2022-12-05 MED ORDER — IOHEXOL 350 MG/ML SOLN
75.0000 mL | Freq: Once | INTRAVENOUS | Status: AC | PRN
  Administered 2022-12-05: 75 mL via INTRAVENOUS

## 2022-12-05 MED ORDER — SODIUM CHLORIDE 0.9 % IV BOLUS
500.0000 mL | Freq: Once | INTRAVENOUS | Status: AC
  Administered 2022-12-05: 500 mL via INTRAVENOUS

## 2022-12-05 MED ORDER — ONDANSETRON HCL 4 MG/2ML IJ SOLN
4.0000 mg | Freq: Once | INTRAMUSCULAR | Status: AC
  Administered 2022-12-05: 4 mg via INTRAVENOUS
  Filled 2022-12-05: qty 2

## 2022-12-05 MED ORDER — LACTATED RINGERS IV BOLUS
1000.0000 mL | Freq: Once | INTRAVENOUS | Status: AC
  Administered 2022-12-05: 1000 mL via INTRAVENOUS

## 2022-12-05 NOTE — H&P (Signed)
History and Physical    Patient: Mary [name] [id number] DOB: 09/02/1961 DOA: 12/05/2022 DOS: the patient was seen and examined on 12/06/2022 PCP: [name], MD  Patient coming from: Home  Chief Complaint:  Chief Complaint  Patient presents with   Jaw Pain   HPI: Mary [name] is a 62 y.o. 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   Patient reports 3 days ago she had pain from left frontal head down to around her jaw, face and to her neck. Has sore throat with pain with swallowing. Has decrease intake due to pain. Says she grinds her teeth a lot at night. Also was afraid of having a dental infection that could affect her heart valve as she has not see a dentist in some time.  2 days ago also started to note increase palpitations but no chest pain. No fever. Has not missed any of her medications including her Coumadin.   In the ED, temperature of 54F, tachycardic with HR 120, BP up to 180/107 on room air.   Leukocytosis of 11.2, Hgb of 13.1. Plt of 401.   Na of 136, K of 5.4 although appears hemolyzed, creatinine of 1.18.   Troponin of 7 and 9. EKG on my review with sinus tachycardia.   INR elevated to 5.1.   Negative Flu/COVID/RSV or Group A strep.   CT head negative and had some diffusely hyperdense appearance of dural venous sinuses that is likely secondary to administration of contrast.   CT soft tissue neck with generalized pharyngeal mucosal space hyperenchancement that may be pharyngitis/mucositis. Negative parapharyngeal and retropharyngeal spaces. No acute dental finding. No TMJ degeneration.   Hospitalist being consulted for persistent tachycardia. She was given total of 1500cc NS bolus, multiple doses of antiemetics and IV opioids.     Review of Systems: As mentioned in the history  of present illness. All other systems reviewed and are negative. Past Medical History:  Diagnosis Date   Abnormal liver function    Acute diastolic heart failure ([place]) 03/15/2013   Alcoholism ([place])    Anemia    B12 deficiency    Carotid stenosis    Carotid US 2/17: bilateral ICA 1-39% >> FU prn   Cataract    Cataract    OU   Childhood asthma    Chronic diastolic CHF (congestive heart failure) (HCC)    Complete heart block (HCC)    Epigastric hernia 200's   GERD (gastroesophageal reflux disease)    Glaucoma suspect    History of blood transfusion    "14 w/1st pregnancy; 2 w/last C-section" (03/15/2013)   Hx of echocardiogram    a. Echo 4/16:  Mild focal basal septal hypertrophy, EF 60-65%, no RWMA, Mechanical MVR ok with mild central regurgitation and no perivalvular leak, small mobile density attached to valvular ring (1.5x1 cm) - post surgical changes vs SBE, mild LAE;   b. 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   Complex valvuloplasty including Cor-matrix ECM patch augmentation of anterior and lateral leaflets with 50m Edwards mc3 ring annuloplasty via right mini-thoracotomy approach   Severe mitral regurgitation 04/22/2013   Sinus tachycardia    SVT (supraventricular tachycardia) 03/16/2013   TIA (transient ischemic attack)    Tricuspid regurgitation    Past Surgical History:  Procedure Laterality Date   APPENDECTOMY  ~1978   BALLOON DILATION N/A 11/27/2020   Procedure: BALLOON DILATION;  Surgeon: GGatha Mayer MD;  [place]  [facility]  Service: Endoscopy;  Laterality: N/A;   BIOPSY  05/01/2020   Procedure: BIOPSY;  Surgeon: JMilus Banister MD;  [facility];  Service: Endoscopy;;   CCalumetWITH BILATERAL TUBAL LIGATION  1999   COLONOSCOPY WITH PROPOFOL N/A 05/01/2020   Procedure: COLONOSCOPY WITH PROPOFOL;  Surgeon: JMilus Banister MD;  [facility];  Service: Endoscopy;  Laterality: N/A;   ESOPHAGOGASTRODUODENOSCOPY (EGD) WITH PROPOFOL N/A 05/01/2020   Procedure: ESOPHAGOGASTRODUODENOSCOPY (EGD) WITH PROPOFOL;  Surgeon: JMilus Banister MD;  [facility];  Service: Endoscopy;  Laterality: N/A;   ESOPHAGOGASTRODUODENOSCOPY (EGD) WITH PROPOFOL N/A 11/27/2020   Procedure: ESOPHAGOGASTRODUODENOSCOPY (EGD) WITH PROPOFOL;  Surgeon: GGatha Mayer MD;  [place] [facility]  Service: Endoscopy;  Laterality: N/A;   FEMORAL HERNIA REPAIR Right 05/23/2013   Procedure: HERNIA REPAIR FEMORAL;  Surgeon: CRexene Alberts MD;  [place] [facility]  Service: Open Heart Surgery;  Laterality: Right;   INTRAOPERATIVE TRANSESOPHAGEAL ECHOCARDIOGRAM N/A 05/23/2013   Procedure: INTRAOPERATIVE TRANSESOPHAGEAL ECHOCARDIOGRAM;  Surgeon: CRexene Alberts MD;  [place] [facility]  Service: Open Heart Surgery;  Laterality: N/A;   LAPAROSCOPIC CHOLECYSTECTOMY  2003   LEFT AND RIGHT HEART CATHETERIZATION WITH CORONARY ANGIOGRAM N/A 03/22/2013   Procedure: LEFT AND RIGHT HEART CATHETERIZATION WITH CORONARY ANGIOGRAM;  Surgeon: CBurnell Blanks MD;  [facility];  Service: Cardiovascular;  Laterality: N/A;   MINIMALLY INVASIVE TRICUSPID VALVE REPAIR Right 05/23/2013   Procedure: MINIMALLY INVASIVE TRICUSPID VALVE REPAIR;  Surgeon: CRexene Alberts MD;  [place] [facility]  Service: Open Heart Surgery;  Laterality: Right;   MITRAL VALVE REPLACEMENT N/A 05/23/2013   Procedure: MITRAL VALVE (MV) REPLACEMENT;  Surgeon:  CRexene Alberts MD;  [place]  Service: Open Heart Surgery;  Laterality: N/A;   MULTIPLE EXTRACTIONS WITH ALVEOLOPLASTY N/A 04/04/2013   Procedure: Extraction of tooth #'s 1,8,9,13,14,15,23,24,25,26 with alveoloplasty and gross debridement of remaining teeth;  Surgeon: RLenn Cal DDS;  [place]  Service: Oral Surgery;  Laterality: N/A;   TEE WITHOUT CARDIOVERSION N/A 03/18/2013   Procedure: TRANSESOPHAGEAL ECHOCARDIOGRAM (TEE);  Surgeon: DLarey Dresser MD;  [place]  Service: Cardiovascular;  Laterality: N/A;   TEE WITHOUT CARDIOVERSION N/A 06/17/2013   Procedure: TRANSESOPHAGEAL ECHOCARDIOGRAM (TEE);  Surgeon: BLelon Perla MD;  [facility];  Service: Cardiovascular;  Laterality: N/A;   TUBAL LIGATION  1999   Social History:  reports that she quit smoking about 9 years ago. Her smoking use included cigarettes. She has a 18.00 pack-year smoking history. She has never used smokeless tobacco. She reports current alcohol use. She reports that she does not use drugs.  Allergies  Allergen Reactions   Oxycodone Itching    Pt states no longer makes her itchy   Tape Itching, Rash  and Other (See Comments)    THE ONLY TAPE THAT IS TOLERATED IS PAPER TAPE. EKG leads inflame the skin    Family History  Problem Relation Age of Onset   Kidney disease Mother        Had one Kidney removed   Stroke Father    Cancer Father    Sarcoidosis Sister    Heart attack Neg Hx    Colon cancer Neg Hx    Stomach cancer Neg Hx    Pancreatic cancer Neg Hx    Esophageal cancer Neg Hx     Prior to Admission medications   Medication Sig Start Date End Date Taking? Authorizing Provider  acetaminophen (TYLENOL) 500 MG tablet Take 1,000 mg by mouth in the morning, at noon, and at bedtime.    [provider]  folic acid (FOLVITE) 1 MG tablet Take 1 tablet by mouth once daily 11/07/22   [name], MD  furosemide (LASIX) 20 MG tablet TAKE 1 TABLET  (20 MG TOTAL) BY MOUTH DAILY. Patient taking differently: Take 20 mg by mouth 2 (two) times daily. 12/24/20 07/19/22  [name], MD  gabapentin (NEURONTIN) 300 MG capsule TAKE 1 CAPSULE BY MOUTH THREE TIMES DAILY 11/05/22   [name], MD  Naphazoline HCl (CLEAR EYES OP) Place 2 drops into both eyes as needed (dry/irritated eyes).    [provider]  pantoprazole (PROTONIX) 40 MG tablet Take 1 tablet by mouth twice daily Patient taking differently: Take 40 mg by mouth 2 (two) times daily. 07/04/22   [name], MD  thiamine 100 MG tablet Take 1 tablet (100 mg total) by mouth daily. 11/22/21   [name], MD  warfarin (COUMADIN) 5 MG tablet Take 1&1/2 tablets by mouth daily or as directed by the coumadin clinic. 11/07/22   Jerline Pain, MD    Physical Exam: Vitals:   12/05/22 2145 12/05/22 2200 12/05/22 2215 12/05/22 2230  BP: (!) 161/109 (!) 172/102 (!) 154/92 (!) 181/99  Pulse: (!) 117 (!) 119 (!) 115 (!) 116  Resp: 10 (!) 22 (!) 9 (!) 9  Temp:      TempSrc:      SpO2: (!) 87% 90% 90% 92%  Weight:      Height:       Constitutional: NAD, calm, comfortable, well appearing elderly female younger than stated age laying in bed Eyes: lids and conjunctivae normal ENMT: Mucous membranes are moist. Posterior pharynx clear of any exudate or lesions.Poor dentition with fillings throughout. No clicking of TMJ with opening and closing of mouth. Slight left facial edema around TMJ. Pain with palpation of left TMJ. Only able to open mouth about 2 finger width due to pain.  Neck: normal, supple, left sided posterior cervical lymphadenopathy Respiratory: clear to auscultation bilaterally,  Normal respiratory effort. No accessory muscle use.  Cardiovascular: Sinus tachycardia, no murmurs / rubs / gallops. No extremity edema.  Abdomen: no tenderness,  Bowel sounds positive.  Musculoskeletal: no clubbing / cyanosis. No joint deformity upper and lower extremities. Good  ROM, no contractures. Normal muscle tone.  Skin: no rashes, lesions, ulcers.  Neurologic: CN 2-12 grossly intact.  Psychiatric: Normal judgment and insight. Alert and oriented x 3. Normal mood. Data Reviewed:  See HPI  Assessment and Plan: * Tachycardia EKG and troponin are reassuring. No cardiac symptoms. -suspect secondary to viral pharyngitis. Full respiratory viral panel pending. 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 Has left sided posterior cervical lymphadenopathy on exam.  -Group A strep, COVID, Flu, RSV negative. Will test for full respiratory viral panel as viral infection most likely contributory to her tachycardiac -lidocaine viscous swish and spit -chloraseptic spray -NSAIDS PRN  Hyperkalemia -K of 5.7 which was hemolyzed but not able to get good stick for repeat -give one time dose Lokelma  (HFpEF) heart failure with preserved ejection fraction (HCC) -stable not in acute exacerbation  S/P mitral valve replacement with metallic valve Supratherapeutic INR -on Coumadin which is supra-therapeutic at INR of 5.1 -Continue to hold Coumadin and follow INR daily      Advance Care Planning:   Code Status: Full Code   Consults: none  Family Communication: none at bedside  Severity of Illness: The appropriate patient status for this patient is OBSERVATION. Observation status is judged to be reasonable and necessary in order to provide the required intensity of service to ensure the patient's safety. The patient's presenting symptoms, physical exam findings, and  initial radiographic and laboratory data in the context of their medical condition is felt to place them at decreased risk for further clinical deterioration. Furthermore, it is anticipated that the patient will be medically stable for discharge from the hospital within 2 midnights of admission.   Author: Orene Desanctis, DO 12/06/2022 12:28 AM  For on call review [email or web address].

## 2022-12-05 NOTE — Assessment & Plan Note (Signed)
Supratherapeutic INR -on Coumadin which is supra-therapeutic at INR of 5.1 -Continue to hold Coumadin and follow INR daily

## 2022-12-05 NOTE — ED Provider Notes (Signed)
[facility]PART[facility] AT [facility] Provider Note   CSN: [id number] Arrival date & time: 12/05/22  0750     History  Chief Complaint  Patient presents with   Jaw Pain    Mary [name] is a 62 y.o. female.  62 year old female presents today for evaluation of pain on the right side of her neck, difficulty swallowing for the past couple days.  She states this started Saturday.  Denies any chest pain, shortness of breath, fever, and URI symptoms.  She does have history of mechanical valve and is on Coumadin.  Recent INR was supratherapeutic at 4.7.  Endorses some headache since onset of the symptoms.  The history is provided by the patient. No language interpreter was used.       Home Medications Prior to Admission medications   Medication Sig Start Date End Date Taking? Authorizing Provider  acetaminophen (TYLENOL) 500 MG tablet Take 1,000 mg by mouth in the morning, at noon, and at bedtime.    [provider]  folic acid (FOLVITE) 1 MG tablet Take 1 tablet by mouth once daily 11/07/22   Marcine Matar, MD  furosemide (LASIX) 20 MG tablet TAKE 1 TABLET (20 MG TOTAL) BY MOUTH DAILY. Patient taking differently: Take 20 mg by mouth 2 (two) times daily. 12/24/20 07/19/22  Marcine Matar, MD  gabapentin (NEURONTIN) 300 MG capsule TAKE 1 CAPSULE BY MOUTH THREE TIMES DAILY 11/05/22   Marcine Matar, MD  Naphazoline HCl (CLEAR EYES OP) Place 2 drops into both eyes as needed (dry/irritated eyes).    [provider]  pantoprazole (PROTONIX) 40 MG tablet Take 1 tablet by mouth twice daily Patient taking differently: Take 40 mg by mouth 2 (two) times daily. 07/04/22   Marcine Matar, MD  thiamine 100 MG tablet Take 1 tablet (100 mg total) by mouth daily. 11/22/21   Marcine Matar, MD  warfarin (COUMADIN) 5 MG tablet Take 1&1/2 tablets by mouth daily or as directed by the coumadin clinic. 11/07/22   Jake Bathe, MD      Allergies     Oxycodone and Tape    Review of Systems   Review of Systems  Constitutional:  Negative for chills and fever.  HENT:  Positive for trouble swallowing. Negative for voice change.   Eyes:  Negative for visual disturbance.  Respiratory:  Negative for cough and shortness of breath.   Cardiovascular:  Negative for chest pain.  Neurological:  Positive for headaches.  All other systems reviewed and are negative.   Physical Exam Updated Vital Signs BP (!) 177/96   Pulse (!) 118   Temp 98.8 F (37.1 C)   Resp 18   Ht 5\' 2"  (1.575 m)   Wt 68 kg   LMP 01/28/2013   SpO2 92%   BMI 27.44 kg/m  Physical Exam Vitals and nursing note reviewed.  Constitutional:      General: She is not in acute distress.    Appearance: Normal appearance. She is not ill-appearing.  HENT:     Head: Normocephalic and atraumatic.     Nose: Nose normal.  Eyes:     General: No scleral icterus.    Extraocular Movements: Extraocular movements intact.     Conjunctiva/sclera: Conjunctivae normal.  Cardiovascular:     Rate and Rhythm: Normal rate and regular rhythm.     Pulses: Normal pulses.     Comments: Mechanical click appreciated Pulmonary:     Effort: Pulmonary effort  is normal. No respiratory distress.     Breath sounds: Normal breath sounds. No wheezing or rales.  Abdominal:     General: There is no distension.     Tenderness: There is no abdominal tenderness.  Musculoskeletal:        General: Normal range of motion.     Cervical back: Normal range of motion.     Right lower leg: No edema.     Left lower leg: No edema.  Skin:    General: Skin is warm and dry.  Neurological:     General: No focal deficit present.     Mental Status: She is alert. Mental status is at baseline.     ED Results / Procedures / Treatments   Labs (all labs ordered are listed, but only abnormal results are displayed) Labs Reviewed  BASIC METABOLIC PANEL - Abnormal; Notable for the following components:      Result  Value   Potassium 5.7 (*)    CO2 20 (*)    Glucose, Bld 107 (*)    Creatinine, Ser 1.18 (*)    GFR, Estimated 53 (*)    All other components within normal limits  CBC WITH DIFFERENTIAL/PLATELET - Abnormal; Notable for the following components:   WBC 11.2 (*)    RBC 3.81 (*)    MCV 105.5 (*)    MCH 34.4 (*)    Platelets 401 (*)    Neutro Abs 8.8 (*)    All other components within normal limits  PROTIME-INR - Abnormal; Notable for the following components:   Prothrombin Time 46.6 (*)    INR 5.1 (*)    All other components within normal limits  POTASSIUM - Abnormal; Notable for the following components:   Potassium 5.4 (*)    All other components within normal limits  RESP PANEL BY RT-PCR (RSV, FLU A&B, COVID)  RVPGX2  GROUP A STREP BY PCR  MAGNESIUM  POTASSIUM  TROPONIN I (HIGH SENSITIVITY)  TROPONIN I (HIGH SENSITIVITY)    EKG EKG Interpretation  Date/Time:  Monday December 05 2022 08:37:49 EDT Ventricular Rate:  123 PR Interval:  134 QRS Duration: 79 QT Interval:  314 QTC Calculation: 450 R Axis:   17 Text Interpretation: Sinus tachycardia RSR' in V1 or V2, right VCD or RVH Confirmed by Benjiman Core [phone]) on 12/05/2022 10:37:06 AM  Radiology CT Soft Tissue Neck W Contrast  Result Date: 12/05/2022 CLINICAL DATA:  62 year old female with right side jaw and neck pain. Arm pain. Chest pain. EXAM: CT NECK WITH CONTRAST TECHNIQUE: Multidetector CT imaging of the neck was performed using the standard protocol following the bolus administration of intravenous contrast. RADIATION DOSE REDUCTION: This exam was performed according to the departmental dose-optimization program which includes automated exposure control, adjustment of the mA and/or kV according to patient size and/or use of iterative reconstruction technique. CONTRAST:  75mL OMNIPAQUE IOHEXOL 350 MG/ML SOLN COMPARISON:  Cervical spine CT 08/14/2020. FINDINGS: Pharynx and larynx: Larynx and pharynx soft tissue  contours appears stable since 2021 and within normal limits. Negative parapharyngeal and retropharyngeal spaces. Questionable generalized pharyngeal mucosal space hyperenhancement (such as on series 3, image 29), but may be artifact. Salivary glands: Negative sublingual space. Chronically atrophied left submandibular gland, stable. Right submandibular gland appears stable and normal. Bilateral parotid glands appear symmetric and normal. Thyroid: Negative. Lymph nodes: Negative. Small bilateral cervical lymph nodes appear stable since 2021 and within normal limits. Vascular: Major vascular structures in the neck and at the skull base  are patent with mild motion artifact. No significant cervical carotid atherosclerosis. Limited intracranial: Negative. Visualized orbits: Negative. Mastoids and visualized paranasal sinuses: Mild maxillary sinus mucosal thickening and/or retention cyst. No sinus fluid levels. Other Visualized paranasal sinuses and mastoids are clear. Skeleton: No acute dental finding. No asymmetric TMJ degeneration. Mandible intact. Mild for age cervical spine degeneration, also stable since 2021. No acute osseous abnormality identified. Upper chest: Negative. IMPRESSION: 1. Questionable generalized pharyngeal mucosal space hyperenhancement. This may be artifact but consider pharyngitis/mucositis. 2. No other acute or inflammatory process identified in the Neck. And mild for age cervical spine degeneration. Electronically Signed   By: Odessa Fleming M.D.   On: 12/05/2022 10:46   DG Chest 2 View  Result Date: 12/05/2022 CLINICAL DATA:  Chest pain EXAM: CHEST - 2 VIEW COMPARISON:  07/18/2022 FINDINGS: Enlargement of cardiac silhouette post MVR and TVR. Mediastinal contours and pulmonary vascularity normal. Lungs clear. No pulmonary infiltrate, pleural effusion, or pneumothorax. Osseous structures unremarkable. IMPRESSION: Enlargement of cardiac silhouette post MVR and TVR. No acute abnormalities.  Electronically Signed   By: Ulyses Southward M.D.   On: 12/05/2022 09:03    Procedures Procedures    Medications Ordered in ED Medications  morphine (PF) 4 MG/ML injection 4 mg (4 mg Intravenous Not Given 12/05/22 1409)  morphine (PF) 4 MG/ML injection 4 mg (4 mg Intravenous Given 12/05/22 0915)  ondansetron (ZOFRAN) injection 4 mg (4 mg Intravenous Given 12/05/22 0912)  iohexol (OMNIPAQUE) 350 MG/ML injection 75 mL (75 mLs Intravenous Contrast Given 12/05/22 1037)  lactated ringers bolus 1,000 mL (1,000 mLs Intravenous New Bag/Given 12/05/22 1305)  ondansetron (ZOFRAN) injection 4 mg (4 mg Intravenous Given 12/05/22 1408)    ED Course/ Medical Decision Making/ A&P                             Medical Decision Making Amount and/or Complexity of Data Reviewed Labs: ordered. Radiology: ordered.  Risk Prescription drug management.   Medical Decision Making / ED Course   This patient presents to the ED for concern of jaw pain, this involves an extensive number of treatment options, and is a complaint that carries with it a high risk of complications and morbidity.  The differential diagnosis includes abscess, pharyngitis, viral URI  MDM: 62 year old female presents today for evaluation of right-sided neck pain going home for the past couple days.  Without chest pain, shortness of breath.  Does have history of mitral valve replacement.  Is supratherapeutic from INR standpoint.  Recently 4.7.  INR 5.4 today.  Discussed with pharmacy.  Recommend holding Coumadin dose next 2 days and then repeating.  CBC shows mild leukocytosis but no significant left shift.  Without anemia.  BMP shows potassium of 5.7, creatinine of 1.18.  Potassium is hemolyzed.  Will repeat.  No other acute findings.  EKG obtained.  Without hyperacute T waves.  Reviewed with attending who does not feel these are hyperacute T waves and recommends repeating potassium level without treating at this time.  Repeat potassium of 5.4.   However still hemolyzed.  Will repeat again.  Discussed with nurse to ensure this is a straight stick.  Strep negative.  Respiratory panel negative.  On exam there is no evidence of RPA, PTA, dental abscess.  Without trismus.  She is still tachycardic.  Since onset of her symptoms she has decreased her p.o. intake..  Tachycardia could be mediated by dehydration.  Will provide fluid bolus.  CT soft tissue neck without evidence of abscess.  Does show potential pharyngitis.  Patient on reevaluation does not have a functioning IV.  Currently awaiting IV team.  Fluids have not been given.  Repeat pain medication has not been given.  Potassium has not been drawn.  Patient signed out to oncoming provider.  Lab Tests: -I ordered, reviewed, and interpreted labs.   The pertinent results include:   Labs Reviewed  BASIC METABOLIC PANEL - Abnormal; Notable for the following components:      Result Value   Potassium 5.7 (*)    CO2 20 (*)    Glucose, Bld 107 (*)    Creatinine, Ser 1.18 (*)    GFR, Estimated 53 (*)    All other components within normal limits  CBC WITH DIFFERENTIAL/PLATELET - Abnormal; Notable for the following components:   WBC 11.2 (*)    RBC 3.81 (*)    MCV 105.5 (*)    MCH 34.4 (*)    Platelets 401 (*)    Neutro Abs 8.8 (*)    All other components within normal limits  PROTIME-INR - Abnormal; Notable for the following components:   Prothrombin Time 46.6 (*)    INR 5.1 (*)    All other components within normal limits  POTASSIUM - Abnormal; Notable for the following components:   Potassium 5.4 (*)    All other components within normal limits  RESP PANEL BY RT-PCR (RSV, FLU A&B, COVID)  RVPGX2  GROUP A STREP BY PCR  MAGNESIUM  POTASSIUM  TROPONIN I (HIGH SENSITIVITY)  TROPONIN I (HIGH SENSITIVITY)      EKG  EKG Interpretation  Date/Time:  Monday December 05 2022 08:37:49 EDT Ventricular Rate:  123 PR Interval:  134 QRS Duration: 79 QT Interval:  314 QTC  Calculation: 450 R Axis:   17 Text Interpretation: Sinus tachycardia RSR' in V1 or V2, right VCD or RVH Confirmed by Benjiman Core [phone]) on 12/05/2022 10:37:06 AM         Imaging Studies ordered: I ordered imaging studies including CT head without contrast, CT soft tissue neck, CT x-ray I independently visualized and interpreted imaging. I agree with the radiologist interpretation   Medicines ordered and prescription drug management: Meds ordered this encounter  Medications   morphine (PF) 4 MG/ML injection 4 mg   ondansetron (ZOFRAN) injection 4 mg   iohexol (OMNIPAQUE) 350 MG/ML injection 75 mL   lactated ringers bolus 1,000 mL   ondansetron (ZOFRAN) injection 4 mg   morphine (PF) 4 MG/ML injection 4 mg    -I have reviewed the patients home medicines and have made adjustments as needed   Cardiac Monitoring: The patient was maintained on a cardiac monitor.  I personally viewed and interpreted the cardiac monitored which showed an underlying rhythm of: Sinus tachycardia  Social Det Co morbidities that complicate the patient evaluation  Past Medical History:  Diagnosis Date   Abnormal liver function    Acute diastolic heart failure (HCC) 03/15/2013   Alcoholism (HCC)    Anemia    B12 deficiency    Carotid stenosis    Carotid US 2/17: bilateral ICA 1-39% >> FU prn   Cataract    Cataract    OU   Childhood asthma    Chronic diastolic CHF (congestive heart failure) (HCC)    Complete heart block (HCC)    Epigastric hernia 200's   GERD (gastroesophageal reflux disease)    Glaucoma suspect    History of blood transfusion    "  14 w/1st pregnancy; 2 w/last C-section" (03/15/2013)   Hx of echocardiogram    a. Echo 4/16:  Mild focal basal septal hypertrophy, EF 60-65%, no RWMA, Mechanical MVR ok with mild central regurgitation and no perivalvular leak, small mobile density attached to valvular ring (1.5x1 cm) - post surgical changes vs SBE, mild LAE;   b. 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  Heart rate is likely elevated due to dehydration.  Suspect this will improve with fluid resuscitation.  No obvious medications noted that would lead to hypokalemia.  Patient signed out to oncoming provider Jeanelle Malling PA-C  Final Clinical Impression(s) / ED Diagnoses Final diagnoses:  Pharyngitis, unspecified etiology  Supratherapeutic INR    Rx / DC Orders ED Discharge Orders     None         Marita Kansas, PA-C 12/05/22 1535    Benjiman Core, MD 12/06/22 [phone]

## 2022-12-05 NOTE — ED Provider Notes (Signed)
  Physical Exam  BP (!) 153/99   Pulse (!) 118   Temp 99.1 F (37.3 C) (Oral)   Resp 11   Ht 5\' 2"  (1.575 m)   Wt 68 kg   LMP 01/28/2013   SpO2 (!) 87%   BMI 27.44 kg/m   Physical Exam  Procedures  Procedures  ED Course / MDM   Clinical Course as of 12/05/22 2225  Mon Dec 05, 2022  2127 Potassium [KL]    Clinical Course User Index [KL] Rex Kras, PA   Medical Decision Making Amount and/or Complexity of Data Reviewed Labs: ordered. Decision-making details documented in ED Course. Radiology: ordered.  Risk Prescription drug management.   I spoke to Dr. Flossie Buffy who agreed to admit the patient.

## 2022-12-05 NOTE — ED Notes (Addendum)
Notified PA of patients BP 172/104

## 2022-12-05 NOTE — ED Notes (Signed)
Patient was given hot tea and chicken broth.

## 2022-12-05 NOTE — ED Triage Notes (Signed)
Patient presents to ed via [facility] c/o right  sided jaw pain with radiation to right lateral neck. C/o right arm pain. Denies chest pain and sob. Onset Sat.

## 2022-12-05 NOTE — ED Notes (Signed)
Patients IV very positional attempted to administer pain meds when patient yells the line popped you can't use it. So I wasted pain meds and put in consult for IV team due to difficult IV start.

## 2022-12-06 DIAGNOSIS — J029 Acute pharyngitis, unspecified: Secondary | ICD-10-CM | POA: Diagnosis present

## 2022-12-06 DIAGNOSIS — F1290 Cannabis use, unspecified, uncomplicated: Secondary | ICD-10-CM | POA: Diagnosis present

## 2022-12-06 DIAGNOSIS — Z885 Allergy status to narcotic agent status: Secondary | ICD-10-CM | POA: Diagnosis not present

## 2022-12-06 DIAGNOSIS — Z79899 Other long term (current) drug therapy: Secondary | ICD-10-CM | POA: Diagnosis not present

## 2022-12-06 DIAGNOSIS — M26609 Unspecified temporomandibular joint disorder, unspecified side: Secondary | ICD-10-CM

## 2022-12-06 DIAGNOSIS — K029 Dental caries, unspecified: Secondary | ICD-10-CM | POA: Diagnosis present

## 2022-12-06 DIAGNOSIS — G629 Polyneuropathy, unspecified: Secondary | ICD-10-CM | POA: Diagnosis present

## 2022-12-06 DIAGNOSIS — R7881 Bacteremia: Secondary | ICD-10-CM | POA: Diagnosis not present

## 2022-12-06 DIAGNOSIS — Z87891 Personal history of nicotine dependence: Secondary | ICD-10-CM | POA: Diagnosis not present

## 2022-12-06 DIAGNOSIS — I110 Hypertensive heart disease with heart failure: Secondary | ICD-10-CM | POA: Diagnosis present

## 2022-12-06 DIAGNOSIS — I5032 Chronic diastolic (congestive) heart failure: Secondary | ICD-10-CM | POA: Diagnosis present

## 2022-12-06 DIAGNOSIS — Z1152 Encounter for screening for COVID-19: Secondary | ICD-10-CM | POA: Diagnosis not present

## 2022-12-06 DIAGNOSIS — E860 Dehydration: Secondary | ICD-10-CM | POA: Diagnosis present

## 2022-12-06 DIAGNOSIS — I442 Atrioventricular block, complete: Secondary | ICD-10-CM | POA: Diagnosis present

## 2022-12-06 DIAGNOSIS — E875 Hyperkalemia: Secondary | ICD-10-CM | POA: Diagnosis present

## 2022-12-06 DIAGNOSIS — Z7901 Long term (current) use of anticoagulants: Secondary | ICD-10-CM | POA: Diagnosis not present

## 2022-12-06 DIAGNOSIS — Z952 Presence of prosthetic heart valve: Secondary | ICD-10-CM | POA: Diagnosis not present

## 2022-12-06 DIAGNOSIS — K047 Periapical abscess without sinus: Secondary | ICD-10-CM | POA: Diagnosis present

## 2022-12-06 DIAGNOSIS — Z841 Family history of disorders of kidney and ureter: Secondary | ICD-10-CM | POA: Diagnosis not present

## 2022-12-06 DIAGNOSIS — E876 Hypokalemia: Secondary | ICD-10-CM | POA: Diagnosis present

## 2022-12-06 DIAGNOSIS — R1310 Dysphagia, unspecified: Secondary | ICD-10-CM | POA: Diagnosis present

## 2022-12-06 DIAGNOSIS — R000 Tachycardia, unspecified: Secondary | ICD-10-CM | POA: Diagnosis not present

## 2022-12-06 DIAGNOSIS — Z823 Family history of stroke: Secondary | ICD-10-CM | POA: Diagnosis not present

## 2022-12-06 DIAGNOSIS — Z9049 Acquired absence of other specified parts of digestive tract: Secondary | ICD-10-CM | POA: Diagnosis not present

## 2022-12-06 DIAGNOSIS — R791 Abnormal coagulation profile: Secondary | ICD-10-CM | POA: Diagnosis present

## 2022-12-06 DIAGNOSIS — Z8673 Personal history of transient ischemic attack (TIA), and cerebral infarction without residual deficits: Secondary | ICD-10-CM | POA: Diagnosis not present

## 2022-12-06 LAB — CBC
HCT: 33.7 % — ABNORMAL LOW (ref 36.0–46.0)
Hemoglobin: 10.9 g/dL — ABNORMAL LOW (ref 12.0–15.0)
MCH: 34.5 pg — ABNORMAL HIGH (ref 26.0–34.0)
MCHC: 32.3 g/dL (ref 30.0–36.0)
MCV: 106.6 fL — ABNORMAL HIGH (ref 80.0–100.0)
Platelets: 343 10*3/uL (ref 150–400)
RBC: 3.16 MIL/uL — ABNORMAL LOW (ref 3.87–5.11)
RDW: 14.5 % (ref 11.5–15.5)
WBC: 10.6 10*3/uL — ABNORMAL HIGH (ref 4.0–10.5)
nRBC: 0 % (ref 0.0–0.2)

## 2022-12-06 LAB — RESPIRATORY PANEL BY PCR

## 2022-12-06 LAB — BASIC METABOLIC PANEL
Anion gap: 9 (ref 5–15)
BUN: 11 mg/dL (ref 6–20)
CO2: 21 mmol/L — ABNORMAL LOW (ref 22–32)
Calcium: 8.7 mg/dL — ABNORMAL LOW (ref 8.9–10.3)
Chloride: 106 mmol/L (ref 98–111)
Creatinine, Ser: 1.32 mg/dL — ABNORMAL HIGH (ref 0.44–1.00)
GFR, Estimated: 46 mL/min — ABNORMAL LOW (ref >60)
Glucose, Bld: 109 mg/dL — ABNORMAL HIGH (ref 70–99)
Potassium: 4.4 mmol/L (ref 3.5–5.1)
Sodium: 136 mmol/L (ref 135–145)

## 2022-12-06 LAB — PROTIME-INR
INR: 4.9 (ref 0.8–1.2)
Prothrombin Time: 45.3 seconds — ABNORMAL HIGH (ref 11.4–15.2)

## 2022-12-06 LAB — RAPID URINE DRUG SCREEN, HOSP PERFORMED
Amphetamines: NOT DETECTED
Barbiturates: NOT DETECTED
Benzodiazepines: NOT DETECTED
Cocaine: NOT DETECTED
Opiates: POSITIVE — AB
Tetrahydrocannabinol: POSITIVE — AB

## 2022-12-06 MED ORDER — GABAPENTIN 300 MG PO CAPS
300.0000 mg | ORAL_CAPSULE | Freq: Three times a day (TID) | ORAL | Status: DC
  Administered 2022-12-06 – 2022-12-09 (×11): 300 mg via ORAL
  Filled 2022-12-06 (×11): qty 1

## 2022-12-06 MED ORDER — IBUPROFEN 400 MG PO TABS: 600.0000 mg | ORAL_TABLET | Freq: Four times a day (QID) | ORAL | Status: DC | PRN

## 2022-12-06 MED ORDER — ACETAMINOPHEN 500 MG PO TABS
1000.0000 mg | ORAL_TABLET | Freq: Three times a day (TID) | ORAL | Status: DC
  Administered 2022-12-06 – 2022-12-09 (×10): 1000 mg via ORAL
  Filled 2022-12-06 (×10): qty 2

## 2022-12-06 MED ORDER — CYCLOBENZAPRINE HCL 10 MG PO TABS
5.0000 mg | ORAL_TABLET | Freq: Every day | ORAL | Status: DC
  Administered 2022-12-06: 5 mg via ORAL
  Filled 2022-12-06: qty 1

## 2022-12-06 MED ORDER — PANTOPRAZOLE SODIUM 40 MG PO TBEC
40.0000 mg | DELAYED_RELEASE_TABLET | Freq: Every day | ORAL | Status: DC
  Administered 2022-12-06 – 2022-12-09 (×4): 40 mg via ORAL
  Filled 2022-12-06 (×4): qty 1

## 2022-12-06 MED ORDER — PHENOL 1.4 % MT LIQD: 1.0000 | OROMUCOSAL | Status: DC | PRN

## 2022-12-06 MED ORDER — IBUPROFEN 200 MG PO TABS
600.0000 mg | ORAL_TABLET | Freq: Four times a day (QID) | ORAL | Status: DC | PRN
  Filled 2022-12-06: qty 3

## 2022-12-06 MED ORDER — SODIUM CHLORIDE 0.9 % IV SOLN
3.0000 g | Freq: Three times a day (TID) | INTRAVENOUS | Status: DC
  Administered 2022-12-06 – 2022-12-09 (×10): 3 g via INTRAVENOUS
  Filled 2022-12-06 (×10): qty 8

## 2022-12-06 MED ORDER — FOLIC ACID 1 MG PO TABS
1.0000 mg | ORAL_TABLET | Freq: Every day | ORAL | Status: DC
  Administered 2022-12-06 – 2022-12-09 (×4): 1 mg via ORAL
  Filled 2022-12-06 (×4): qty 1

## 2022-12-06 MED ORDER — ACETAMINOPHEN 325 MG PO TABS
650.0000 mg | ORAL_TABLET | Freq: Once | ORAL | Status: AC
  Administered 2022-12-06: 650 mg via ORAL
  Filled 2022-12-06: qty 2

## 2022-12-06 MED ORDER — SODIUM ZIRCONIUM CYCLOSILICATE 5 G PO PACK
5.0000 g | PACK | Freq: Once | ORAL | Status: AC
  Administered 2022-12-06: 5 g via ORAL
  Filled 2022-12-06: qty 1

## 2022-12-06 MED ORDER — LIDOCAINE VISCOUS HCL 2 % MT SOLN
15.0000 mL | Freq: Once | OROMUCOSAL | Status: AC
  Administered 2022-12-06: 15 mL via OROMUCOSAL
  Filled 2022-12-06: qty 15

## 2022-12-06 MED ORDER — THIAMINE MONONITRATE 100 MG PO TABS
100.0000 mg | ORAL_TABLET | Freq: Every day | ORAL | Status: DC
  Administered 2022-12-06 – 2022-12-09 (×4): 100 mg via ORAL
  Filled 2022-12-06 (×4): qty 1

## 2022-12-06 MED ORDER — LACTATED RINGERS IV SOLN: INTRAVENOUS | Status: AC

## 2022-12-06 MED ORDER — CYCLOBENZAPRINE HCL 10 MG PO TABS
5.0000 mg | ORAL_TABLET | Freq: Three times a day (TID) | ORAL | Status: DC
  Administered 2022-12-06 – 2022-12-09 (×10): 5 mg via ORAL
  Filled 2022-12-06 (×10): qty 1

## 2022-12-06 NOTE — Assessment & Plan Note (Signed)
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

## 2022-12-06 NOTE — Assessment & Plan Note (Signed)
-  stable not in acute exacerbation

## 2022-12-06 NOTE — ED Notes (Addendum)
Pt placed on 2L O2 due to sat's dropping as pt sleeps, no other distress noted. Pt ok with this. Call bell in reach.

## 2022-12-06 NOTE — Assessment & Plan Note (Addendum)
EKG and troponin are reassuring. No cardiac symptoms. -suspect secondary to viral pharyngitis. Full respiratory viral panel pending. -keep on continuous telemetry -continuous fluids overnight

## 2022-12-06 NOTE — ED Notes (Signed)
Helped pt up to restroom and back to bed. No acute distress noted. Call bell in reach. Bed locked & low.

## 2022-12-06 NOTE — Progress Notes (Signed)
PROGRESS NOTE    Mary [name]  [id number] DOB: 04-01-1961 DOA: 12/05/2022 PCP: [name], MD    Brief Narrative:  62 year old female with history of chronic diastolic heart failure, mechanical mitral valve on Coumadin, history of stroke, presented with right-sided headache, facial pain and neck pain for about 3 days.  No preceding symptoms.  Also complained of sore throat, and palpitations.  In the emergency room temperature 99.  Heart rate 120 and sinus.  Blood pressure elevated.  WC count 11.2.  Troponin is normal.  EKG with sinus tachycardia.  INR 5.1.  Flu COVID RSV and group A strep negative.  CT head with dural venous sinuses hyperdense appearance.  CT neck with pharyngeal mucosal hyperenhancement.  No TMJ involvement.  Due to persistent symptoms she was admitted to the hospital.  Started on IV fluids and pain medications.   Assessment & Plan:   Pharyngitis/trismus: No obvious evidence of suppurative pharyngitis but patient is very symptomatic with sore throat and referred pain to her right ears, TMJ.  Some clinical improvement with pain medications and IV fluids. COVID, flu, RSV negative.  Strep a negative. Primary cause unknown.  Given severity of symptoms will treat with Unasyn today.  Increase dose of Tylenol around-the-clock, increase dose of Flexeril around-the-clock. Monitor for symptoms.  If persistent, will discuss with ENT.  Sinus tachycardia: Likely secondary to above.  Patient also smokes marijuana.  EKG and troponins nonischemic.  Monitor.  Chronic medical issues including  Mechanical mitral valve on Coumadin, INR is supratherapeutic.  Hold Coumadin. Chronic diastolic heart failure, euvolemic. Hyperkalemia, probably due to dehydration.  Corrected with IV fluids.  Given a dose of Lokelma. Neuropathy, on gabapentin.     DVT prophylaxis: Coumadin.   Code Status: Full code Family Communication: None at the bedside Disposition Plan: Status is:  Observation The patient will require care spanning > 2 midnights and should be moved to inpatient because: Significant neck pain, tachycardia, IV antibiotics     Consultants:  None  Procedures:  None  Antimicrobials:  Unasyn 3/12---   Subjective: Patient seen and examined.  Still in the emergency room.  Afebrile overnight.  Still has right-sided jaw pain and some headache but swallowing and sore throat is much improved.  No more palpitations today.  Objective: Vitals:   12/06/22 1100 12/06/22 1130 12/06/22 1200 12/06/22 1216  BP: 132/73  125/71   Pulse:  (!) 103    Resp: '13 13 12   '$ Temp:    98.4 F (36.9 C)  TempSrc:    Oral  SpO2:  99%    Weight:      Height:        Intake/Output Summary (Last 24 hours) at 12/06/2022 1256 Last data filed at 12/06/2022 1238 Gross per 24 hour  Intake 2421.08 ml  Output --  Net 2421.08 ml   Filed Weights   12/05/22 0758  Weight: 68 kg    Examination:  General exam: Appears calm and comfortable.  Appropriately anxious. She can open her mouth.  There is no tenderness on TMJ joint palpation.  Unable to visualize posterior pharynx.  Neck palpation without lymphadenopathy or local tenderness. Respiratory system: Clear to auscultation. Respiratory effort normal. Cardiovascular system: S1 & S2 heard, RRR. No JVD, murmurs, rubs, gallops or clicks. No pedal edema. Gastrointestinal system: Abdomen is nondistended, soft and nontender. No organomegaly or masses felt. Normal bowel sounds heard. Central nervous system: Alert and oriented. No focal neurological deficits. Extremities: Symmetric 5 x 5 power.  Data Reviewed: I have personally reviewed following labs and imaging studies  CBC: Recent Labs  Lab 12/05/22 0824 12/06/22 0515  WBC 11.2* 10.6*  NEUTROABS 8.8*  --   HGB 13.1 10.9*  HCT 40.2 33.7*  MCV 105.5* 106.6*  PLT 401* [id number]   Basic Metabolic Panel: Recent Labs  Lab 12/05/22 0824 12/05/22 1045 12/05/22 1935  12/06/22 0515  NA 136  --   --  136  K 5.7* 5.4* 5.4* 4.4  CL 104  --   --  106  CO2 20*  --   --  21*  GLUCOSE 107*  --   --  109*  BUN 9  --   --  11  CREATININE 1.18*  --   --  1.32*  CALCIUM 9.3  --   --  8.7*  MG 2.0  --   --   --    GFR: Estimated Creatinine Clearance: 41 mL/min (A) (by C-G formula based on SCr of 1.32 mg/dL (H)). Liver Function Tests: No results for input(s): "AST", "ALT", "ALKPHOS", "BILITOT", "PROT", "ALBUMIN" in the last 168 hours. No results for input(s): "LIPASE", "AMYLASE" in the last 168 hours. No results for input(s): "AMMONIA" in the last 168 hours. Coagulation Profile: Recent Labs  Lab 12/05/22 0824 12/06/22 0515  INR 5.1* 4.9*   Cardiac Enzymes: No results for input(s): "CKTOTAL", "CKMB", "CKMBINDEX", "TROPONINI" in the last 168 hours. BNP (last 3 results) No results for input(s): "PROBNP" in the last 8760 hours. HbA1C: No results for input(s): "HGBA1C" in the last 72 hours. CBG: No results for input(s): "GLUCAP" in the last 168 hours. Lipid Profile: No results for input(s): "CHOL", "HDL", "LDLCALC", "TRIG", "CHOLHDL", "LDLDIRECT" in the last 72 hours. Thyroid Function Tests: No results for input(s): "TSH", "T4TOTAL", "FREET4", "T3FREE", "THYROIDAB" in the last 72 hours. Anemia Panel: No results for input(s): "VITAMINB12", "FOLATE", "FERRITIN", "TIBC", "IRON", "RETICCTPCT" in the last 72 hours. Sepsis Labs: No results for input(s): "PROCALCITON", "LATICACIDVEN" in the last 168 hours.  Recent Results (from the past 240 hour(s))  Resp panel by RT-PCR (RSV, Flu A&B, Covid) Anterior Nasal Swab     Status: None   Collection Time: 12/05/22 11:15 AM   Specimen: Anterior Nasal Swab  Result Value Ref Range Status   SARS Coronavirus 2 by RT PCR NEGATIVE NEGATIVE Final   Influenza A by PCR NEGATIVE NEGATIVE Final   Influenza B by PCR NEGATIVE NEGATIVE Final    Comment: (NOTE) The Xpert Xpress SARS-CoV-2/FLU/RSV plus assay is intended as an  aid in the diagnosis of influenza from Nasopharyngeal swab specimens and should not be used as a sole basis for treatment. Nasal washings and aspirates are unacceptable for Xpert Xpress SARS-CoV-2/FLU/RSV testing.  Fact Sheet for Patients: EntrepreneurPulse.com.au  Fact Sheet for Healthcare Providers: IncredibleEmployment.be  This test is not yet approved or cleared by the [place] FDA and has been authorized for detection and/or diagnosis of SARS-CoV-2 by FDA under an Emergency Use Authorization (EUA). This EUA will remain in effect (meaning this test can be used) for the duration of the COVID-19 declaration under Section 564(b)(1) of the Act, 21 U.S.C. section 360bbb-3(b)(1), unless the authorization is terminated or revoked.     Resp Syncytial Virus by PCR NEGATIVE NEGATIVE Final    Comment: (NOTE) Fact Sheet for Patients: EntrepreneurPulse.com.au  Fact Sheet for Healthcare Providers: IncredibleEmployment.be  This test is not yet approved or cleared by the [place] FDA and has been authorized for detection and/or diagnosis of SARS-CoV-2 by FDA  under an Emergency Use Authorization (EUA). This EUA will remain in effect (meaning this test can be used) for the duration of the COVID-19 declaration under Section 564(b)(1) of the Act, 21 U.S.C. section 360bbb-3(b)(1), unless the authorization is terminated or revoked.  Performed at [facility], [place] [address] [place], [place] [address]   Group A Strep by PCR     Status: None   Collection Time: 12/05/22 11:15 AM   Specimen: Anterior Nasal Swab; Sterile Swab  Result Value Ref Range Status   Group A Strep by PCR NOT DETECTED NOT DETECTED Final    Comment: Performed at [facility], [place] [address], [place], [place] [address]  Respiratory (~20 pathogens) panel by PCR     Status: None   Collection Time: 12/06/22  1:14 AM   Specimen:  Nasopharyngeal Swab; Respiratory  Result Value Ref Range Status   Adenovirus NOT DETECTED NOT DETECTED Final   Coronavirus 229E NOT DETECTED NOT DETECTED Final    Comment: (NOTE) The Coronavirus on the Respiratory Panel, DOES NOT test for the novel  Coronavirus (2019 nCoV)    Coronavirus HKU1 NOT DETECTED NOT DETECTED Final   Coronavirus NL63 NOT DETECTED NOT DETECTED Final   Coronavirus OC43 NOT DETECTED NOT DETECTED Final   Metapneumovirus NOT DETECTED NOT DETECTED Final   Rhinovirus / Enterovirus NOT DETECTED NOT DETECTED Final   Influenza A NOT DETECTED NOT DETECTED Final   Influenza B NOT DETECTED NOT DETECTED Final   Parainfluenza Virus 1 NOT DETECTED NOT DETECTED Final   Parainfluenza Virus 2 NOT DETECTED NOT DETECTED Final   Parainfluenza Virus 3 NOT DETECTED NOT DETECTED Final   Parainfluenza Virus 4 NOT DETECTED NOT DETECTED Final   Respiratory Syncytial Virus NOT DETECTED NOT DETECTED Final   Bordetella pertussis NOT DETECTED NOT DETECTED Final   Bordetella Parapertussis NOT DETECTED NOT DETECTED Final   Chlamydophila pneumoniae NOT DETECTED NOT DETECTED Final   Mycoplasma pneumoniae NOT DETECTED NOT DETECTED Final    Comment: Performed at [facility], [place]. [address], [place], [place] [address]         Radiology Studies: CT Head Wo Contrast  Result Date: 12/05/2022 CLINICAL DATA:  Headache EXAM: CT HEAD WITHOUT CONTRAST TECHNIQUE: Contiguous axial images were obtained from the base of the skull through the vertex without intravenous contrast. RADIATION DOSE REDUCTION: This exam was performed according to the departmental dose-optimization program which includes automated exposure control, adjustment of the mA and/or kV according to patient size and/or use of iterative reconstruction technique. COMPARISON:  CT NEck 12/05/22, CT head 01/30/21 FINDINGS: Brain: No hydrocephalus. No extra-axial fluid collection. No hemorrhage. No CT evidence of an acute infarct. No mass  effect. No midline shift there is sequela of mild chronic microvascular ischemic change. Vascular: Diffusely hyperdense appearance of the dural venous sinuses is likely secondary to administration of iodinated contrast for same day CT neck performed at 10:30 a.m. Skull: Normal. Negative for fracture or focal lesion. Sinuses/Orbits: No mastoid or middle ear effusion. Paranasal sinuses are clear. Orbits are unremarkable. Other: None IMPRESSION: 1. No specific finding to explain headache. 2. Diffusely hyperdense appearance of the dural venous sinuses is likely secondary to administration of iodinated contrast for same day CT neck performed at 10:30 AM. Electronically Signed   By: Marin Roberts M.D.   On: 12/05/2022 14:44   CT Soft Tissue Neck W Contrast  Result Date: 12/05/2022 CLINICAL DATA:  62 year old female with right side jaw and neck pain.  Arm pain. Chest pain. EXAM: CT NECK WITH CONTRAST TECHNIQUE: Multidetector CT imaging of the neck was performed using the standard protocol following the bolus administration of intravenous contrast. RADIATION DOSE REDUCTION: This exam was performed according to the departmental dose-optimization program which includes automated exposure control, adjustment of the mA and/or kV according to patient size and/or use of iterative reconstruction technique. CONTRAST:  4m OMNIPAQUE IOHEXOL 350 MG/ML SOLN COMPARISON:  Cervical spine CT 08/14/2020. FINDINGS: Pharynx and larynx: Larynx and pharynx soft tissue contours appears stable since 2021 and within normal limits. Negative parapharyngeal and retropharyngeal spaces. Questionable generalized pharyngeal mucosal space hyperenhancement (such as on series 3, image 29), but may be artifact. Salivary glands: Negative sublingual space. Chronically atrophied left submandibular gland, stable. Right submandibular gland appears stable and normal. Bilateral parotid glands appear symmetric and normal. Thyroid: Negative. Lymph nodes:  Negative. Small bilateral cervical lymph nodes appear stable since 2021 and within normal limits. Vascular: Major vascular structures in the neck and at the skull base are patent with mild motion artifact. No significant cervical carotid atherosclerosis. Limited intracranial: Negative. Visualized orbits: Negative. Mastoids and visualized paranasal sinuses: Mild maxillary sinus mucosal thickening and/or retention cyst. No sinus fluid levels. Other Visualized paranasal sinuses and mastoids are clear. Skeleton: No acute dental finding. No asymmetric TMJ degeneration. Mandible intact. Mild for age cervical spine degeneration, also stable since 2021. No acute osseous abnormality identified. Upper chest: Negative. IMPRESSION: 1. Questionable generalized pharyngeal mucosal space hyperenhancement. This may be artifact but consider pharyngitis/mucositis. 2. No other acute or inflammatory process identified in the Neck. And mild for age cervical spine degeneration. Electronically Signed   By: HGenevie AnnM.D.   On: 12/05/2022 10:46   DG Chest 2 View  Result Date: 12/05/2022 CLINICAL DATA:  Chest pain EXAM: CHEST - 2 VIEW COMPARISON:  07/18/2022 FINDINGS: Enlargement of cardiac silhouette post MVR and TVR. Mediastinal contours and pulmonary vascularity normal. Lungs clear. No pulmonary infiltrate, pleural effusion, or pneumothorax. Osseous structures unremarkable. IMPRESSION: Enlargement of cardiac silhouette post MVR and TVR. No acute abnormalities. Electronically Signed   By: MLavonia DanaM.D.   On: 12/05/2022 09:03        Scheduled Meds:  acetaminophen  1,000 mg Oral TID   cyclobenzaprine  5 mg Oral TID   folic acid  1 mg Oral Daily   gabapentin  300 mg Oral TID   pantoprazole  40 mg Oral Daily   thiamine  100 mg Oral Daily   Continuous Infusions:  ampicillin-sulbactam (UNASYN) IV Stopped (12/06/22 1238)     LOS: 0 days    Time spent: 35 minutes    KBarb Merino MD [facility] Pager  [phone]

## 2022-12-06 NOTE — Evaluation (Signed)
Physical Therapy Evaluation Patient Details Name: Mary [name] MRN: [id number] DOB: 31-Jul-1961 Today's Date: 12/06/2022  History of Present Illness  62 y.o. female presents to [facility] hospital on 12/05/2022 with L sided HA, facial and neck pain. 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  Clinical Impression  Pt presents to PT with deficits in gait, balance, sensation, endurance, and with continued head/neck/jaw pain (although improved compared to pre-admission). Pt reports continued discomfort and headache at this time, neck ROM is functional. PT provides instruction on jaw ROM and stretching to reduce tension in jaw. Pt demonstrates mild instability during gait, but reports this is not much different than baseline as she is affected by peripheral neuropathy. Pt will benefit from continued mobilization in an effort to improve stability and activity tolerance. PT suggests the pt pursue outpatient PT if jaw pain continues to persist after discharge.      Recommendations for follow up therapy are one component of a multi-disciplinary discharge planning process, led by the attending physician.  Recommendations may be updated based on patient status, additional functional criteria and insurance authorization.  Follow Up Recommendations No PT follow up (pt will pursue outpatient PT if jaw pain persists once discharged)      Assistance Recommended at Discharge Intermittent Supervision/Assistance  Patient can return home with the following  Help with stairs or ramp for entrance    Equipment Recommendations None recommended by PT  Recommendations for Other Services       Functional Status Assessment Patient has had a recent decline in their functional status and demonstrates the ability to make significant improvements in function in a reasonable and predictable amount of time.     Precautions /  Restrictions Precautions Precautions: Fall Restrictions Weight Bearing Restrictions: No      Mobility  Bed Mobility Overal bed mobility: Modified Independent                  Transfers Overall transfer level: Needs assistance Equipment used: Straight cane Transfers: Sit to/from Stand Sit to Stand: Supervision                Ambulation/Gait Ambulation/Gait assistance: Supervision Gait Distance (Feet): 100 Feet Assistive device: Straight cane, IV Pole Gait Pattern/deviations: Step-to pattern, Wide base of support Gait velocity: reduced Gait velocity interpretation: <1.31 ft/sec, indicative of household ambulator   General Gait Details: pt with slowed step-to gait, initially with BUE support of SPC and IV pole, pt progresses to only unilateral support of cane by end of walk. Mild lateral instability and reduced SLS time bilaterally  Stairs            Wheelchair Mobility    Modified Rankin (Stroke Patients Only)       Balance Overall balance assessment: Needs assistance Sitting-balance support: No upper extremity supported, Feet supported Sitting balance-Leahy Scale: Good     Standing balance support: Single extremity supported, Reliant on assistive device for balance Standing balance-Leahy Scale: Poor                               Pertinent Vitals/Pain Pain Assessment Pain Assessment: 0-10 Pain Score: 6  Pain Location: jaw, head, neck Pain Descriptors / Indicators: Throbbing Pain Intervention(s): Monitored during session    Home Living Family/patient expects to be discharged to:: Private residence Living Arrangements: Alone Available Help at Discharge: Family;Available PRN/intermittently Type of Home: Apartment  Home Access: Stairs to enter Entrance Stairs-Rails: Right;Left Entrance Stairs-Number of Steps: 2 flights   Home Layout: One level Home Equipment: Conservation officer, nature (2 wheels);Cane - single point;BSC/3in1      Prior  Function Prior Level of Function : Independent/Modified Independent             Mobility Comments: ambulates with use of SPC       Hand Dominance        Extremity/Trunk Assessment   Upper Extremity Assessment Upper Extremity Assessment: Overall WFL for tasks assessed    Lower Extremity Assessment Lower Extremity Assessment: Generalized weakness    Cervical / Trunk Assessment Cervical / Trunk Assessment: Normal  Communication   Communication: No difficulties  Cognition Arousal/Alertness: Awake/alert Behavior During Therapy: WFL for tasks assessed/performed Overall Cognitive Status: Within Functional Limits for tasks assessed                                          General Comments General comments (skin integrity, edema, etc.): VSS on RA    Exercises Other Exercises Other Exercises: PT provides instruction for TMJ ROM and general jaw ROM/stretching exercise   Assessment/Plan    PT Assessment Patient needs continued PT services  PT Problem List Decreased strength;Decreased activity tolerance;Decreased balance;Decreased mobility;Pain       PT Treatment Interventions DME instruction;Gait training;Stair training;Functional mobility training;Therapeutic activities;Therapeutic exercise;Balance training;Neuromuscular re-education;Patient/family education    PT Goals (Current goals can be found in the Care Plan section)  Acute Rehab PT Goals Patient Stated Goal: to reduce pain, go home PT Goal Formulation: With patient Time For Goal Achievement: 12/20/22 Potential to Achieve Goals: Good    Frequency Min 3X/week     Co-evaluation               AM-PAC PT "6 Clicks" Mobility  Outcome Measure Help needed turning from your back to your side while in a flat bed without using bedrails?: None Help needed moving from lying on your back to sitting on the side of a flat bed without using bedrails?: None Help needed moving to and from a bed to a  chair (including a wheelchair)?: A Little Help needed standing up from a chair using your arms (e.g., wheelchair or bedside chair)?: A Little Help needed to walk in hospital room?: A Little Help needed climbing 3-5 steps with a railing? : A Lot 6 Click Score: 19    End of Session   Activity Tolerance: Patient tolerated treatment well Patient left: Other (comment) (left in restroom in ED, RN aware and at desk) Nurse Communication: Mobility status PT Visit Diagnosis: Other abnormalities of gait and mobility (R26.89);Muscle weakness (generalized) (M62.81);Other symptoms and signs involving the nervous system (R29.898)    Time: [id number] PT Time Calculation (min) (ACUTE ONLY): 26 min   Charges:   PT Evaluation $PT Eval Low Complexity: [place], PT, DPT Acute Rehabilitation Office [phone]   Zenaida Niece 12/06/2022, 10:15 AM

## 2022-12-06 NOTE — Assessment & Plan Note (Signed)
-  K of 5.7 which was hemolyzed but not able to get good stick for repeat -give one time dose [facility]

## 2022-12-06 NOTE — Assessment & Plan Note (Signed)
-  cause of her left sided neck pain and sore throat. Has left sided posterior cervical lymphadenopathy on exam.  -Group A strep, COVID, Flu, RSV negative. Will test for full respiratory viral panel as viral infection most likely contributory to her tachycardiac -lidocaine viscous swish and spit -chloraseptic spray -NSAIDS PRN

## 2022-12-06 NOTE — ED Notes (Signed)
ED TO INPATIENT HANDOFF REPORT  ED Nurse Name and Phone #: Providence Lanius, 57  S Name/Age/Gender Mary [name] 62 y.o. female Room/Bed: 037C/037C  Code Status   Code Status: Full Code  Home/SNF/Other Home Patient oriented to: self, place, time, and situation Is this baseline? Yes   Triage Complete: Triage complete  Chief Complaint Tachycardia [R00.0] Pharyngitis [J02.9]  Triage Note Patient presents to ed via [facility] c/o right  sided jaw pain with radiation to right lateral neck. C/o right arm pain. Denies chest pain and sob. Onset Sat.    Allergies Allergies  Allergen Reactions   Ibuprofen Itching   Oxycodone Itching    Pt states no longer makes her itchy   Tape Itching, Rash and Other (See Comments)    THE ONLY TAPE THAT IS TOLERATED IS PAPER TAPE. EKG leads inflame the skin    Level of Care/Admitting Diagnosis ED Disposition     ED Disposition  Admit   Condition  --   Comment  Hospital Area: [place] [[id number]]  Level of Care: Telemetry Medical [104]  May admit patient to [facility] or [facility] if equivalent level of care is available:: Yes  Covid Evaluation: Asymptomatic - no recent exposure (last 10 days) testing not required  Diagnosis: Pharyngitis [227735]  Admitting Physician: Barb Merino [id number]  Attending Physician: Barb Merino [id number]  Certification:: I certify this patient will need inpatient services for at least 2 midnights  Estimated Length of Stay: 2          B Medical/Surgery History Past Medical History:  Diagnosis Date   Abnormal liver function    Acute diastolic heart failure ([place]) 03/15/2013   Alcoholism ([place])    Anemia    B12 deficiency    Carotid stenosis    Carotid US 2/17: bilateral ICA 1-39% >> FU prn   Cataract    Cataract    OU   Childhood asthma    Chronic diastolic CHF (congestive heart failure) ([place])    Complete heart block ([place])    Epigastric hernia 200's   GERD (gastroesophageal  reflux disease)    Glaucoma suspect    History of blood transfusion    "14 w/1st pregnancy; 2 w/last C-section" (03/15/2013)   Hx of echocardiogram    a. Echo 4/16:  Mild focal basal septal hypertrophy, EF 60-65%, no RWMA, Mechanical MVR ok with mild central regurgitation and no perivalvular leak, small mobile density attached to valvular ring (1.5x1 cm) - post surgical changes vs SBE, mild LAE;   b. 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   Complex valvuloplasty including Cor-matrix ECM patch augmentation of anterior and lateral leaflets with 272mEdwards mc3 ring annuloplasty via right mini-thoracotomy approach   Severe mitral regurgitation 04/22/2013   Sinus tachycardia    SVT (supraventricular tachycardia) 03/16/2013   TIA (transient ischemic attack)    Tricuspid regurgitation    Past Surgical History:  Procedure Laterality Date   APPENDECTOMY  ~1978   BALLOON DILATION N/A  11/27/2020   Procedure: BALLOON DILATION;  Surgeon: Gatha Mayer, MD;  Location: [facility] ENDOSCOPY;  Service: Endoscopy;  Laterality: N/A;   BIOPSY  05/01/2020   Procedure: BIOPSY;  Surgeon: Milus Banister, MD;  Location: [facility] ENDOSCOPY;  Service: Endoscopy;;   [place] WITH BILATERAL TUBAL LIGATION  1999   COLONOSCOPY WITH PROPOFOL N/A 05/01/2020   Procedure: COLONOSCOPY WITH PROPOFOL;  Surgeon: Milus Banister, MD;  Location: [facility] ENDOSCOPY;  Service: Endoscopy;  Laterality: N/A;    ESOPHAGOGASTRODUODENOSCOPY (EGD) WITH PROPOFOL N/A 05/01/2020   Procedure: ESOPHAGOGASTRODUODENOSCOPY (EGD) WITH PROPOFOL;  Surgeon: Milus Banister, MD;  Location[facility] END[facility]COPY;  Service: Endoscopy;  Laterality: N/A;   ESOPHAGOGASTRODUODENOSCOPY (EGD) WITH PROPOFOL N/A 11/27/2020   Procedure: ESOPHAGOGASTRODUODENOSCOPY (EGD) WITH PROPOFOL;  Surgeon: Gatha Mayer, MD;  Location: [place];  Service: Endoscopy;  Laterality: N/A;   FEMORAL HERNIA REPAIR Right 05/23/2013   Procedure: HERNIA REPAIR FEMORAL;  Surgeon: Rexene Alberts, MD;  Location: [place];  Service: Open Heart Surgery;  Laterality: Right;   INTRAOPERATIVE TRANSESOPHAGEAL ECHOCARDIOGRAM N/A 05/23/2013   Procedure: INTRAOPERATIVE TRANSESOPHAGEAL ECHOCARDIOGRAM;  Surgeon: Rexene Alberts, MD;  Location: [place];  Service: Open Heart Surgery;  Laterality: N/A;   LAPAROSCOPIC CHOLECYSTECTOMY  2003   LEFT AND RIGHT HEART CATHETERIZATION WITH CORONARY ANGIOGRAM N/A 03/22/2013   Procedure: LEFT AND RIGHT HEART CATHETERIZATION WITH CORONARY ANGIOGRAM;  Surgeon: Burnell Blanks, MD;  Location: [facility] CATH LAB;  Service: Cardiovascular;  Laterality: N/A;   MINIMALLY INVASIVE TRICUSPID VALVE REPAIR Right 05/23/2013   Procedure: MINIMALLY INVASIVE TRICUSPID VALVE REPAIR;  Surgeon: Rexene Alberts, MD;  Location: [place];  Service: Open Heart Surgery;  Laterality: Right;   MITRAL VALVE REPLACEMENT N/A 05/23/2013   Procedure: MITRAL VALVE (MV) REPLACEMENT;  Surgeon: Rexene Alberts, MD;  Location: [place];  Service: Open Heart Surgery;  Laterality: N/A;   MULTIPLE EXTRACTIONS WITH ALVEOLOPLASTY N/A 04/04/2013   Procedure: Extraction of tooth #'s 1,8,9,13,14,15,23,24,25,26 with alveoloplasty and gross debridement of remaining teeth;  Surgeon: Lenn Cal, DDS;  Location: [place];  Service: Oral Surgery;  Laterality: N/A;   TEE WITHOUT CARDIOVERSION N/A 03/18/2013   Procedure: TRANSESOPHAGEAL ECHOCARDIOGRAM (TEE);  Surgeon: Larey Dresser, MD;  Location:  [place];  Service: Cardiovascular;  Laterality: N/A;   TEE WITHOUT CARDIOVERSION N/A 06/17/2013   Procedure: TRANSESOPHAGEAL ECHOCARDIOGRAM (TEE);  Surgeon: Lelon Perla, MD;  Location: [place];  Service: Cardiovascular;  Laterality: N/A;   [place]     A IV Location/Drains/Wounds Patient Lines/Drains/Airways Status     Active Line/Drains/Airways     Name Placement date Placement time Site Days   Peripheral IV 12/05/22 20 G Left Antecubital 12/05/22  0803  Antecubital  1   Peripheral IV 12/05/22 20 G 1.88" Anterior;Left;Upper Arm 12/05/22  1700  Arm  1            Intake/Output Last 24 hours  Intake/Output Summary (Last 24 hours) at 12/06/2022 1750 Last data filed at 12/06/2022 1238 Gross per 24 hour  Intake 2421.08 ml  Output --  Net 2421.08 ml    Labs/Imaging Results for orders placed or performed during the hospital encounter of 12/05/22 (from the past 48 hour(s))  Basic metabolic panel     Status: Abnormal   Collection Time: 12/05/22  8:24 AM  Result Value Ref Range   Sodium 136 135 -  145 mmol/L   Potassium 5.7 (H) 3.5 - 5.1 mmol/L    Comment: HEMOLYSIS AT THIS LEVEL MAY AFFECT RESULT   Chloride 104 98 - 111 mmol/L   CO2 20 (L) 22 - 32 mmol/L   Glucose, Bld 107 (H) 70 - 99 mg/dL    Comment: Glucose reference range applies only to samples taken after fasting for at least 8 hours.   BUN 9 6 - 20 mg/dL   Creatinine, Ser 1.18 (H) 0.44 - 1.00 mg/dL   Calcium 9.3 8.9 - 10.3 mg/dL   GFR, Estimated 53 (L) >60 mL/min    Comment: (NOTE) Calculated using the CKD-EPI Creatinine Equation (2021)    Anion gap 12 5 - 15    Comment: Performed at [facility] [address], [place], [place] [address]  CBC with Differential/Platelet     Status: Abnormal   Collection Time: 12/05/22  8:24 AM  Result Value Ref Range   WBC 11.2 (H) 4.0 - 10.5 K/uL   RBC 3.81 (L) 3.87 - 5.11 MIL/uL   Hemoglobin 13.1 12.0 - 15.0 g/dL   HCT 40.2 36.0 - 46.0 %   MCV  105.5 (H) 80.0 - 100.0 fL   MCH 34.4 (H) 26.0 - 34.0 pg   MCHC 32.6 30.0 - 36.0 g/dL   RDW 14.6 11.5 - 15.5 %   Platelets 401 (H) 150 - 400 K/uL   nRBC 0.0 0.0 - 0.2 %   Neutrophils Relative % 80 %   Neutro Abs 8.8 (H) 1.7 - 7.7 K/uL   Lymphocytes Relative 13 %   Lymphs Abs 1.5 0.7 - 4.0 K/uL   Monocytes Relative 6 %   Monocytes Absolute 0.6 0.1 - 1.0 K/uL   Eosinophils Relative 1 %   Eosinophils Absolute 0.1 0.0 - 0.5 K/uL   Basophils Relative 0 %   Basophils Absolute 0.0 0.0 - 0.1 K/uL   Immature Granulocytes 0 %   Abs Immature Granulocytes 0.04 0.00 - 0.07 K/uL    Comment: Performed at [facility], [place] [address], [place], [place] [address]  Magnesium     Status: None   Collection Time: 12/05/22  8:24 AM  Result Value Ref Range   Magnesium 2.0 1.7 - 2.4 mg/dL    Comment: Performed at [facility], [place] [address] [place], [place] [address]  Troponin I (High Sensitivity)     Status: None   Collection Time: 12/05/22  8:24 AM  Result Value Ref Range   Troponin I (High Sensitivity) 7 <18 ng/L    Comment: (NOTE) Elevated high sensitivity troponin I (hsTnI) values and significant  changes across serial measurements may suggest ACS but many other  chronic and acute conditions are known to elevate hsTnI results.  Refer to the "Links" section for chest pain algorithms and additional  guidance. Performed at [facility], [place] [address] [place], [place] [address]   Protime-INR     Status: Abnormal   Collection Time: 12/05/22  8:24 AM  Result Value Ref Range   Prothrombin Time 46.6 (H) 11.4 - 15.2 seconds   INR 5.1 (HH) 0.8 - 1.2    Comment: REPEATED TO VERIFY CRITICAL RESULT CALLED TO, READ BACK BY AND VERIFIED WITH: Cristi Loron, RN [phone] 12/05/2022 BY MACEDA,J. (NOTE) INR goal varies based on device and disease states. Performed at [facility], [place] [address], [place], [place] [address]   Troponin I (High Sensitivity)     Status: None  Collection  Time: 12/05/22 10:45 AM  Result Value Ref Range   Troponin I (High Sensitivity) 9 <18 ng/L    Comment: (NOTE) Elevated high sensitivity troponin I (hsTnI) values and significant  changes across serial measurements may suggest ACS but many other  chronic and acute conditions are known to elevate hsTnI results.  Refer to the "Links" section for chest pain algorithms and additional  guidance. Performed at [facility], [place] [address] [place], [place] [address]   Potassium     Status: Abnormal   Collection Time: 12/05/22 10:45 AM  Result Value Ref Range   Potassium 5.4 (H) 3.5 - 5.1 mmol/L    Comment: HEMOLYSIS AT THIS LEVEL MAY AFFECT RESULT Performed at [facility], [place] [address], [place], [place] [address]   Resp panel by RT-PCR (RSV, Flu A&B, Covid) Anterior Nasal Swab     Status: None   Collection Time: 12/05/22 11:15 AM   Specimen: Anterior Nasal Swab  Result Value Ref Range   SARS Coronavirus 2 by RT PCR NEGATIVE NEGATIVE   Influenza A by PCR NEGATIVE NEGATIVE   Influenza B by PCR NEGATIVE NEGATIVE    Comment: (NOTE) The Xpert Xpress SARS-CoV-2/FLU/RSV plus assay is intended as an aid in the diagnosis of influenza from Nasopharyngeal swab specimens and should not be used as a sole basis for treatment. Nasal washings and aspirates are unacceptable for Xpert Xpress SARS-CoV-2/FLU/RSV testing.  Fact Sheet for Patients: EntrepreneurPulse.com.au  Fact Sheet for Healthcare Providers: IncredibleEmployment.be  This test is not yet approved or cleared by the [place] FDA and has been authorized for detection and/or diagnosis of SARS-CoV-2 by FDA under an Emergency Use Authorization (EUA). This EUA will remain in effect (meaning this test can be used) for the duration of the COVID-19 declaration under Section 564(b)(1) of the Act, 21 U.S.C. section 360bbb-3(b)(1), unless the authorization is terminated or revoked.      Resp Syncytial Virus by PCR NEGATIVE NEGATIVE    Comment: (NOTE) Fact Sheet for Patients: EntrepreneurPulse.com.au  Fact Sheet for Healthcare Providers: IncredibleEmployment.be  This test is not yet approved or cleared by the [place] FDA and has been authorized for detection and/or diagnosis of SARS-CoV-2 by FDA under an Emergency Use Authorization (EUA). This EUA will remain in effect (meaning this test can be used) for the duration of the COVID-19 declaration under Section 564(b)(1) of the Act, 21 U.S.C. section 360bbb-3(b)(1), unless the authorization is terminated or revoked.  Performed at [facility], [place] [address] [place], [place] [address]   Group A Strep by PCR     Status: None   Collection Time: 12/05/22 11:15 AM   Specimen: Anterior Nasal Swab; Sterile Swab  Result Value Ref Range   Group A Strep by PCR NOT DETECTED NOT DETECTED    Comment: Performed at [facility] [address], [place], [place] [address]  Potassium     Status: Abnormal   Collection Time: 12/05/22  7:35 PM  Result Value Ref Range   Potassium 5.4 (H) 3.5 - 5.1 mmol/L    Comment: Performed at [facility], [place] [address] [place],  [address]  Respiratory (~20 pathogens) panel by PCR     Status: None   Collection Time: 12/06/22  1:14 AM   Specimen: Nasopharyngeal Swab; Respiratory  Result Value Ref Range   Adenovirus NOT DETECTED NOT DETECTED   Coronavirus 229E NOT DETECTED NOT DETECTED    Comment: (NOTE) The  Coronavirus on the Respiratory Panel, DOES NOT test for the novel  Coronavirus (2019 nCoV)    Coronavirus HKU1 NOT DETECTED NOT DETECTED   Coronavirus NL63 NOT DETECTED NOT DETECTED   Coronavirus OC43 NOT DETECTED NOT DETECTED   Metapneumovirus NOT DETECTED NOT DETECTED   Rhinovirus / Enterovirus NOT DETECTED NOT DETECTED   Influenza A NOT DETECTED NOT DETECTED   Influenza B NOT DETECTED NOT DETECTED   Parainfluenza  Virus 1 NOT DETECTED NOT DETECTED   Parainfluenza Virus 2 NOT DETECTED NOT DETECTED   Parainfluenza Virus 3 NOT DETECTED NOT DETECTED   Parainfluenza Virus 4 NOT DETECTED NOT DETECTED   Respiratory Syncytial Virus NOT DETECTED NOT DETECTED   Bordetella pertussis NOT DETECTED NOT DETECTED   Bordetella Parapertussis NOT DETECTED NOT DETECTED   Chlamydophila pneumoniae NOT DETECTED NOT DETECTED   Mycoplasma pneumoniae NOT DETECTED NOT DETECTED    Comment: Performed at [facility], [place] [address] [place], [place] [address]  Protime-INR     Status: Abnormal   Collection Time: 12/06/22  5:15 AM  Result Value Ref Range   Prothrombin Time 45.3 (H) 11.4 - 15.2 seconds   INR 4.9 (HH) 0.8 - 1.2    Comment: REPEATED TO VERIFY CRITICAL RESULT CALLED TO, READ BACK BY AND VERIFIED WITH: Assunta Found, RN AT 0600 ON 12/06/22 BY H. HOWARD. (NOTE) INR goal varies based on device and disease states. Performed at [facility], [place] [address] [place], [place] [address]   CBC     Status: Abnormal   Collection Time: 12/06/22  5:15 AM  Result Value Ref Range   WBC 10.6 (H) 4.0 - 10.5 K/uL   RBC 3.16 (L) 3.87 - 5.11 MIL/uL   Hemoglobin 10.9 (L) 12.0 - 15.0 g/dL   HCT 33.7 (L) 36.0 - 46.0 %   MCV 106.6 (H) 80.0 - 100.0 fL   MCH 34.5 (H) 26.0 - 34.0 pg   MCHC 32.3 30.0 - 36.0 g/dL   RDW 14.5 11.5 - 15.5 %   Platelets 343 150 - 400 K/uL   nRBC 0.0 0.0 - 0.2 %    Comment: Performed at [facility], [place] [address], [place], [place] [id number]  Basic metabolic panel     Status: Abnormal   Collection Time: 12/06/22  5:15 AM  Result Value Ref Range   Sodium 136 135 - 145 mmol/L   Potassium 4.4 3.5 - 5.1 mmol/L   Chloride 106 98 - 111 mmol/L   CO2 21 (L) 22 - 32 mmol/L   Glucose, Bld 109 (H) 70 - 99 mg/dL    Comment: Glucose reference range applies only to samples taken after fasting for at least 8 hours.   BUN 11 6 - 20 mg/dL   Creatinine, Ser 1.32 (H) 0.44 - 1.00 mg/dL   Calcium 8.7  (L) 8.9 - 10.3 mg/dL   GFR, Estimated 46 (L) >60 mL/min    Comment: (NOTE) Calculated using the CKD-EPI Creatinine Equation (2021)    Anion gap 9 5 - 15    Comment: Performed at [facility] [address], [place],  [address]  Rapid urine drug screen (hospital performed)     Status: Abnormal   Collection Time: 12/06/22  5:45 AM  Result Value Ref Range   Opiates POSITIVE (A) NONE DETECTED   Cocaine NONE DETECTED NONE DETECTED   Benzodiazepines NONE DETECTED NONE DETECTED   Amphetamines NONE DETECTED NONE DETECTED   Tetrahydrocannabinol POSITIVE (A) NONE  DETECTED   Barbiturates NONE DETECTED NONE DETECTED    Comment: (NOTE) DRUG SCREEN FOR MEDICAL PURPOSES ONLY.  IF CONFIRMATION IS NEEDED FOR ANY PURPOSE, NOTIFY LAB WITHIN 5 DAYS.  LOWEST DETECTABLE LIMITS FOR URINE DRUG SCREEN Drug Class                     Cutoff (ng/mL) Amphetamine and metabolites    1000 Barbiturate and metabolites    200 Benzodiazepine                 200 Opiates and metabolites        300 Cocaine and metabolites        300 THC                            50 Performed at [facility], [place] [address] [place], [place] [address]    *Note: Due to a large number of results and/or encounters for the requested time period, some results have not been displayed. A complete set of results can be found in Results Review.   CT Head Wo Contrast  Result Date: 12/05/2022 CLINICAL DATA:  Headache EXAM: CT HEAD WITHOUT CONTRAST TECHNIQUE: Contiguous axial images were obtained from the base of the skull through the vertex without intravenous contrast. RADIATION DOSE REDUCTION: This exam was performed according to the departmental dose-optimization program which includes automated exposure control, adjustment of the mA and/or kV according to patient size and/or use of iterative reconstruction technique. COMPARISON:  CT NEck 12/05/22, CT head 01/30/21 FINDINGS: Brain: No hydrocephalus. No extra-axial fluid  collection. No hemorrhage. No CT evidence of an acute infarct. No mass effect. No midline shift there is sequela of mild chronic microvascular ischemic change. Vascular: Diffusely hyperdense appearance of the dural venous sinuses is likely secondary to administration of iodinated contrast for same day CT neck performed at 10:30 a.m. Skull: Normal. Negative for fracture or focal lesion. Sinuses/Orbits: No mastoid or middle ear effusion. Paranasal sinuses are clear. Orbits are unremarkable. Other: None IMPRESSION: 1. No specific finding to explain headache. 2. Diffusely hyperdense appearance of the dural venous sinuses is likely secondary to administration of iodinated contrast for same day CT neck performed at 10:30 AM. Electronically Signed   By: Marin Roberts M.D.   On: 12/05/2022 14:44   CT Soft Tissue Neck W Contrast  Result Date: 12/05/2022 CLINICAL DATA:  62 year old female with right side jaw and neck pain. Arm pain. Chest pain. EXAM: CT NECK WITH CONTRAST TECHNIQUE: Multidetector CT imaging of the neck was performed using the standard protocol following the bolus administration of intravenous contrast. RADIATION DOSE REDUCTION: This exam was performed according to the departmental dose-optimization program which includes automated exposure control, adjustment of the mA and/or kV according to patient size and/or use of iterative reconstruction technique. CONTRAST:  29m OMNIPAQUE IOHEXOL 350 MG/ML SOLN COMPARISON:  Cervical spine CT 08/14/2020. FINDINGS: Pharynx and larynx: Larynx and pharynx soft tissue contours appears stable since 2021 and within normal limits. Negative parapharyngeal and retropharyngeal spaces. Questionable generalized pharyngeal mucosal space hyperenhancement (such as on series 3, image 29), but may be artifact. Salivary glands: Negative sublingual space. Chronically atrophied left submandibular gland, stable. Right submandibular gland appears stable and normal. Bilateral parotid  glands appear symmetric and normal. Thyroid: Negative. Lymph nodes: Negative. Small bilateral cervical lymph nodes appear stable since 2021 and within normal limits. Vascular: Major vascular structures in the neck and at the  skull base are patent with mild motion artifact. No significant cervical carotid atherosclerosis. Limited intracranial: Negative. Visualized orbits: Negative. Mastoids and visualized paranasal sinuses: Mild maxillary sinus mucosal thickening and/or retention cyst. No sinus fluid levels. Other Visualized paranasal sinuses and mastoids are clear. Skeleton: No acute dental finding. No asymmetric TMJ degeneration. Mandible intact. Mild for age cervical spine degeneration, also stable since 2021. No acute osseous abnormality identified. Upper chest: Negative. IMPRESSION: 1. Questionable generalized pharyngeal mucosal space hyperenhancement. This may be artifact but consider pharyngitis/mucositis. 2. No other acute or inflammatory process identified in the Neck. And mild for age cervical spine degeneration. Electronically Signed   By: Genevie Ann M.D.   On: 12/05/2022 10:46   DG Chest 2 View  Result Date: 12/05/2022 CLINICAL DATA:  Chest pain EXAM: CHEST - 2 VIEW COMPARISON:  07/18/2022 FINDINGS: Enlargement of cardiac silhouette post MVR and TVR. Mediastinal contours and pulmonary vascularity normal. Lungs clear. No pulmonary infiltrate, pleural effusion, or pneumothorax. Osseous structures unremarkable. IMPRESSION: Enlargement of cardiac silhouette post MVR and TVR. No acute abnormalities. Electronically Signed   By: Lavonia Dana M.D.   On: 12/05/2022 09:03    Pending Labs Unresulted Labs (From admission, onward)     Start     Ordered   12/07/22 0500  CBC with Differential/Platelet  Tomorrow morning,   R       Question:  Specimen collection method  Answer:  IV Team=IV Team collect   12/06/22 1248   12/07/22 0500  Comprehensive metabolic panel  Tomorrow morning,   R       Question:  Specimen  collection method  Answer:  IV Team=IV Team collect   12/06/22 1248   12/07/22 0500  Magnesium  Tomorrow morning,   R       Question:  Specimen collection method  Answer:  IV Team=IV Team collect   12/06/22 1248   12/07/22 0500  Phosphorus  Tomorrow morning,   R       Question:  Specimen collection method  Answer:  IV Team=IV Team collect   12/06/22 1248   12/07/22 0500  Protime-INR  Tomorrow morning,   R       Question:  Specimen collection method  Answer:  IV Team=IV Team collect   12/06/22 1248            Vitals/Pain Today's Vitals   12/06/22 1600 12/06/22 1700 12/06/22 1718 12/06/22 1718  BP: (!) 144/78 106/63    Pulse: (!) 104 100    Resp: 17 15    Temp:   98.7 F (37.1 C)   TempSrc:   Oral   SpO2: 98% 92%    Weight:      Height:      PainSc:    7     Isolation Precautions Droplet precaution  Medications Medications  pantoprazole (PROTONIX) EC tablet 40 mg (40 mg Oral Given 0000000 [id number])  folic acid (FOLVITE) tablet 1 mg (1 mg Oral Given 12/06/22 0929)  gabapentin (NEURONTIN) capsule 300 mg (300 mg Oral Given 12/06/22 1514)  thiamine (VITAMIN B1) tablet 100 mg (100 mg Oral Given 12/06/22 0929)  acetaminophen (TYLENOL) tablet 1,000 mg (1,000 mg Oral Given 12/06/22 1514)  lactated ringers infusion (0 mLs Intravenous Stopped 12/06/22 1238)  phenol (CHLORASEPTIC) mouth spray 1 spray (has no administration in time range)  Ampicillin-Sulbactam (UNASYN) 3 g in sodium chloride 0.9 % 100 mL IVPB (0 g Intravenous Stopped 12/06/22 1238)  cyclobenzaprine (FLEXERIL) tablet 5 mg (5 mg Oral Given  12/06/22 1514)  morphine (PF) 4 MG/ML injection 4 mg (4 mg Intravenous Given 12/05/22 0915)  ondansetron (ZOFRAN) injection 4 mg (4 mg Intravenous Given 12/05/22 0912)  iohexol (OMNIPAQUE) 350 MG/ML injection 75 mL (75 mLs Intravenous Contrast Given 12/05/22 1037)  lactated ringers bolus 1,000 mL (0 mLs Intravenous Stopped 12/05/22 1925)  ondansetron (ZOFRAN) injection 4 mg (4 mg Intravenous  Given 12/05/22 1408)  morphine (PF) 4 MG/ML injection 4 mg (4 mg Intravenous Given 12/05/22 1737)  HYDROmorphone (DILAUDID) injection 1 mg (1 mg Intravenous Given 12/05/22 2012)  sodium chloride 0.9 % bolus 500 mL (0 mLs Intravenous Stopped 12/05/22 2119)  gabapentin (NEURONTIN) capsule 300 mg (300 mg Oral Given 12/05/22 2140)  sodium zirconium cyclosilicate (LOKELMA) packet 5 g (5 g Oral Given 12/06/22 0045)  lidocaine (XYLOCAINE) 2 % viscous mouth solution 15 mL (15 mLs Mouth/Throat Given 12/06/22 0051)  acetaminophen (TYLENOL) tablet 650 mg (650 mg Oral Given 12/06/22 0531)    Mobility walks with device     Focused Assessments Cardiac Assessment Handoff:  Cardiac Rhythm: Sinus tachycardia Lab Results  Component Value Date   CKTOTAL 35 (L) 01/30/2021   CKMB 1.0 03/15/2013   TROPONINI <0.03 11/29/2017   Lab Results  Component Value Date   DDIMER 0.55 (H) 12/19/2016   Does the Patient currently have chest pain? No    R Recommendations: See Admitting Provider Note  Report given to:   Additional Notes:   R

## 2022-12-06 NOTE — Assessment & Plan Note (Signed)
Continue home gabapentin ?

## 2022-12-06 NOTE — ED Notes (Signed)
Pt report received from previous nurse. Pt A&O x4, vitals stable, denies needs/complaints. Call bell in reach. No acute distress noted.  

## 2022-12-07 DIAGNOSIS — R000 Tachycardia, unspecified: Secondary | ICD-10-CM | POA: Diagnosis not present

## 2022-12-07 LAB — COMPREHENSIVE METABOLIC PANEL
ALT: 14 U/L (ref 0–44)
AST: 23 U/L (ref 15–41)
Albumin: 2.9 g/dL — ABNORMAL LOW (ref 3.5–5.0)
Alkaline Phosphatase: 89 U/L (ref 38–126)
Anion gap: 6 (ref 5–15)
BUN: 11 mg/dL (ref 6–20)
CO2: 23 mmol/L (ref 22–32)
Calcium: 8.5 mg/dL — ABNORMAL LOW (ref 8.9–10.3)
Chloride: 109 mmol/L (ref 98–111)
Creatinine, Ser: 1.28 mg/dL — ABNORMAL HIGH (ref 0.44–1.00)
GFR, Estimated: 48 mL/min — ABNORMAL LOW (ref >60)
Glucose, Bld: 105 mg/dL — ABNORMAL HIGH (ref 70–99)
Potassium: 4.5 mmol/L (ref 3.5–5.1)
Sodium: 138 mmol/L (ref 135–145)
Total Bilirubin: 0.2 mg/dL — ABNORMAL LOW (ref 0.3–1.2)
Total Protein: 5.8 g/dL — ABNORMAL LOW (ref 6.5–8.1)

## 2022-12-07 LAB — CBC WITH DIFFERENTIAL/PLATELET
Abs Immature Granulocytes: 0.03 10*3/uL (ref 0.00–0.07)
Basophils Absolute: 0 10*3/uL (ref 0.0–0.1)
Basophils Relative: 0 %
Eosinophils Absolute: 0.2 10*3/uL (ref 0.0–0.5)
Eosinophils Relative: 3 %
HCT: 30.9 % — ABNORMAL LOW (ref 36.0–46.0)
Hemoglobin: 9.7 g/dL — ABNORMAL LOW (ref 12.0–15.0)
Immature Granulocytes: 0 %
Lymphocytes Relative: 18 %
Lymphs Abs: 1.3 10*3/uL (ref 0.7–4.0)
MCH: 33.8 pg (ref 26.0–34.0)
MCHC: 31.4 g/dL (ref 30.0–36.0)
MCV: 107.7 fL — ABNORMAL HIGH (ref 80.0–100.0)
Monocytes Absolute: 0.5 10*3/uL (ref 0.1–1.0)
Monocytes Relative: 7 %
Neutro Abs: 5.2 10*3/uL (ref 1.7–7.7)
Neutrophils Relative %: 72 %
Platelets: 279 10*3/uL (ref 150–400)
RBC: 2.87 MIL/uL — ABNORMAL LOW (ref 3.87–5.11)
RDW: 14.5 % (ref 11.5–15.5)
WBC: 7.2 10*3/uL (ref 4.0–10.5)
nRBC: 0 % (ref 0.0–0.2)

## 2022-12-07 LAB — PROTIME-INR
INR: 3.6 — ABNORMAL HIGH (ref 0.8–1.2)
Prothrombin Time: 36 seconds — ABNORMAL HIGH (ref 11.4–15.2)

## 2022-12-07 LAB — MAGNESIUM: Magnesium: 1.8 mg/dL (ref 1.7–2.4)

## 2022-12-07 LAB — PHOSPHORUS: Phosphorus: 3.6 mg/dL (ref 2.5–4.6)

## 2022-12-07 LAB — TSH: TSH: 1.214 u[IU]/mL (ref 0.350–4.500)

## 2022-12-07 MED ORDER — HYDROCODONE-ACETAMINOPHEN 5-325 MG PO TABS
1.0000 | ORAL_TABLET | ORAL | Status: DC | PRN
  Administered 2022-12-07 – 2022-12-09 (×6): 1 via ORAL
  Filled 2022-12-07 (×6): qty 1

## 2022-12-07 MED ORDER — HYDROCODONE-ACETAMINOPHEN 5-325 MG PO TABS
1.0000 | ORAL_TABLET | Freq: Once | ORAL | Status: AC
  Administered 2022-12-07: 1 via ORAL
  Filled 2022-12-07: qty 1

## 2022-12-07 NOTE — Progress Notes (Signed)
Mobility Specialist Progress Note:   12/07/22 1226  Mobility  Activity Ambulated with assistance in hallway  Level of Assistance Standby assist, set-up cues, supervision of patient - no hands on  Assistive Device Cane  Distance Ambulated (ft) 150 ft  Activity Response Tolerated well  $Mobility charge 1 Mobility   Pt in bed willing to participate in mobility. No complaints of pain. Left at sink to get washed up with call bell in reach and all needs met.   Gareth Eagle Zhavia Cunanan Mobility Specialist Please contact via [employer] or  Rehab Office at [phone]

## 2022-12-07 NOTE — Progress Notes (Signed)
PROGRESS NOTE    Mary [name]  [id number] DOB: 09/11/1961 DOA: 12/05/2022 PCP: [name], MD    Brief Narrative:  62 year old female with history of chronic diastolic heart failure, mechanical mitral valve on Coumadin, history of stroke, presented with right-sided headache, facial pain and neck pain for about 3 days.  No preceding symptoms.  Also complained of sore throat, and palpitations.  In the emergency room temperature 99.  Heart rate 120 and sinus.  Blood pressure elevated.  WC count 11.2.  Troponin is normal.  EKG with sinus tachycardia.  INR 5.1.  Flu COVID RSV and group A strep negative.  CT head with dural venous sinuses hyperdense appearance.  CT neck with pharyngeal mucosal hyperenhancement.  No TMJ involvement.  Due to persistent symptoms she was admitted to the hospital.  Started on IV fluids and pain medications.   Assessment & Plan:   Pharyngitis/trismus: No obvious evidence of suppurative pharyngitis but patient is very symptomatic with referred pain to her right ears, TMJ.   COVID, flu, RSV negative.  Strep a negative. Primary cause unknown.  Given severity of symptoms will continue antibiotics. Tylenol, Flexeril.  Additional pain medications. Due to persistent symptoms, I called and discussed case with ENT and they will see patient in consultation.   Sinus tachycardia: Likely secondary to above.  Patient also smokes marijuana.  EKG and troponins nonischemic.  Monitor. TSH normal.  Chronic medical issues including  Mechanical mitral valve on Coumadin, INR was supratherapeutic.  INR 3.6.  Will need to redose Coumadin, will wait for ENT recommendations if any procedures will be planned. Chronic diastolic heart failure, euvolemic. Hyperkalemia, probably due to dehydration.  Corrected with IV fluids.  Given a dose of Lokelma. Neuropathy, on gabapentin.     DVT prophylaxis: Coumadin.   Code Status: Full code Family Communication: None at the  bedside Disposition Plan: Status is: Inpatient.  Persistently symptomatic.   Consultants:  ENT, called  Procedures:  None  Antimicrobials:  Unasyn 3/12---   Subjective:  Patient seen and examined.  Remains anxious.  She tells me that she never had sore throat.  Difficulty opening mouth and some tightness on the right side of the face.  Denies any palpitations.  Telemetry monitor with heart rate anywhere between 100-120.  Objective: Vitals:   12/06/22 1901 12/06/22 1904 12/06/22 2033 12/07/22 1202  BP:  (!) 144/78 136/77 134/73  Pulse: (!) 107 (!) 107  99  Resp:  '18 18 17  '$ Temp: 99.1 F (37.3 C) 99.1 F (37.3 C) 98.6 F (37 C) 97.8 F (36.6 C)  TempSrc: Oral Oral Oral Oral  SpO2:  94%  97%  Weight:      Height:        Intake/Output Summary (Last 24 hours) at 12/07/2022 1448 Last data filed at 12/07/2022 0800 Gross per 24 hour  Intake 440 ml  Output --  Net 440 ml    Filed Weights   12/05/22 0758  Weight: 68 kg    Examination:  General exam: Appears anxious. Patient has difficulty opening mouth fully.  She has diffuse tenderness on the right mandibular region.  No palpable abscess or adenopathy.  No airway compromise seen. Respiratory system: Clear to auscultation. Respiratory effort normal. Cardiovascular system: S1 & S2 heard, RRR. No JVD, murmurs, rubs, gallops or clicks. No pedal edema. Gastrointestinal system: Abdomen is nondistended, soft and nontender. No organomegaly or masses felt. Normal bowel sounds heard. Central nervous system: Alert and oriented. No focal neurological deficits.  Extremities: Symmetric 5 x 5 power.    Data Reviewed: I have personally reviewed following labs and imaging studies  CBC: Recent Labs  Lab 12/05/22 0824 12/06/22 0515 12/07/22 0110  WBC 11.2* 10.6* 7.2  NEUTROABS 8.8*  --  5.2  HGB 13.1 10.9* 9.7*  HCT 40.2 33.7* 30.9*  MCV 105.5* 106.6* 107.7*  PLT 401* 343 [id number]    Basic Metabolic Panel: Recent Labs  Lab  12/05/22 0824 12/05/22 1045 12/05/22 1935 12/06/22 0515 12/07/22 0110  NA 136  --   --  136 138  K 5.7* 5.4* 5.4* 4.4 4.5  CL 104  --   --  106 109  CO2 20*  --   --  21* 23  GLUCOSE 107*  --   --  109* 105*  BUN 9  --   --  11 11  CREATININE 1.18*  --   --  1.32* 1.28*  CALCIUM 9.3  --   --  8.7* 8.5*  MG 2.0  --   --   --  1.8  PHOS  --   --   --   --  3.6    GFR: Estimated Creatinine Clearance: 42.3 mL/min (A) (by C-G formula based on SCr of 1.28 mg/dL (H)). Liver Function Tests: Recent Labs  Lab 12/07/22 0110  AST 23  ALT 14  ALKPHOS 89  BILITOT 0.2*  PROT 5.8*  ALBUMIN 2.9*   No results for input(s): "LIPASE", "AMYLASE" in the last 168 hours. No results for input(s): "AMMONIA" in the last 168 hours. Coagulation Profile: Recent Labs  Lab 12/05/22 0824 12/06/22 0515 12/07/22 0110  INR 5.1* 4.9* 3.6*    Cardiac Enzymes: No results for input(s): "CKTOTAL", "CKMB", "CKMBINDEX", "TROPONINI" in the last 168 hours. BNP (last 3 results) No results for input(s): "PROBNP" in the last 8760 hours. HbA1C: No results for input(s): "HGBA1C" in the last 72 hours. CBG: No results for input(s): "GLUCAP" in the last 168 hours. Lipid Profile: No results for input(s): "CHOL", "HDL", "LDLCALC", "TRIG", "CHOLHDL", "LDLDIRECT" in the last 72 hours. Thyroid Function Tests: Recent Labs    12/07/22 0110  TSH 1.214   Anemia Panel: No results for input(s): "VITAMINB12", "FOLATE", "FERRITIN", "TIBC", "IRON", "RETICCTPCT" in the last 72 hours. Sepsis Labs: No results for input(s): "PROCALCITON", "LATICACIDVEN" in the last 168 hours.  Recent Results (from the past 240 hour(s))  Resp panel by RT-PCR (RSV, Flu A&B, Covid) Anterior Nasal Swab     Status: None   Collection Time: 12/05/22 11:15 AM   Specimen: Anterior Nasal Swab  Result Value Ref Range Status   SARS Coronavirus 2 by RT PCR NEGATIVE NEGATIVE Final   Influenza A by PCR NEGATIVE NEGATIVE Final   Influenza B by PCR  NEGATIVE NEGATIVE Final    Comment: (NOTE) The Xpert Xpress SARS-CoV-2/FLU/RSV plus assay is intended as an aid in the diagnosis of influenza from Nasopharyngeal swab specimens and should not be used as a sole basis for treatment. Nasal washings and aspirates are unacceptable for Xpert Xpress SARS-CoV-2/FLU/RSV testing.  Fact Sheet for Patients: EntrepreneurPulse.com.au  Fact Sheet for Healthcare Providers: IncredibleEmployment.be  This test is not yet approved or cleared by the [place] FDA and has been authorized for detection and/or diagnosis of SARS-CoV-2 by FDA under an Emergency Use Authorization (EUA). This EUA will remain in effect (meaning this test can be used) for the duration of the COVID-19 declaration under Section 564(b)(1) of the Act, 21 U.S.C. section 360bbb-3(b)(1), unless the authorization is  terminated or revoked.     Resp Syncytial Virus by PCR NEGATIVE NEGATIVE Final    Comment: (NOTE) Fact Sheet for Patients: EntrepreneurPulse.com.au  Fact Sheet for Healthcare Providers: IncredibleEmployment.be  This test is not yet approved or cleared by the [place] FDA and has been authorized for detection and/or diagnosis of SARS-CoV-2 by FDA under an Emergency Use Authorization (EUA). This EUA will remain in effect (meaning this test can be used) for the duration of the COVID-19 declaration under Section 564(b)(1) of the Act, 21 U.S.C. section 360bbb-3(b)(1), unless the authorization is terminated or revoked.  Performed at [facility], [place] [address] [place], [place] [address]   Group A Strep by PCR     Status: None   Collection Time: 12/05/22 11:15 AM   Specimen: Anterior Nasal Swab; Sterile Swab  Result Value Ref Range Status   Group A Strep by PCR NOT DETECTED NOT DETECTED Final    Comment: Performed at [facility], [place] [address], [place], [place] [address]   Respiratory (~20 pathogens) panel by PCR     Status: None   Collection Time: 12/06/22  1:14 AM   Specimen: Nasopharyngeal Swab; Respiratory  Result Value Ref Range Status   Adenovirus NOT DETECTED NOT DETECTED Final   Coronavirus 229E NOT DETECTED NOT DETECTED Final    Comment: (NOTE) The Coronavirus on the Respiratory Panel, DOES NOT test for the novel  Coronavirus (2019 nCoV)    Coronavirus HKU1 NOT DETECTED NOT DETECTED Final   Coronavirus NL63 NOT DETECTED NOT DETECTED Final   Coronavirus OC43 NOT DETECTED NOT DETECTED Final   Metapneumovirus NOT DETECTED NOT DETECTED Final   Rhinovirus / Enterovirus NOT DETECTED NOT DETECTED Final   Influenza A NOT DETECTED NOT DETECTED Final   Influenza B NOT DETECTED NOT DETECTED Final   Parainfluenza Virus 1 NOT DETECTED NOT DETECTED Final   Parainfluenza Virus 2 NOT DETECTED NOT DETECTED Final   Parainfluenza Virus 3 NOT DETECTED NOT DETECTED Final   Parainfluenza Virus 4 NOT DETECTED NOT DETECTED Final   Respiratory Syncytial Virus NOT DETECTED NOT DETECTED Final   Bordetella pertussis NOT DETECTED NOT DETECTED Final   Bordetella Parapertussis NOT DETECTED NOT DETECTED Final   Chlamydophila pneumoniae NOT DETECTED NOT DETECTED Final   Mycoplasma pneumoniae NOT DETECTED NOT DETECTED Final    Comment: Performed at [facility], [place]. [address], [place], [place] [address]         Radiology Studies: No results found.      Scheduled Meds:  acetaminophen  1,000 mg Oral TID   cyclobenzaprine  5 mg Oral TID   folic acid  1 mg Oral Daily   gabapentin  300 mg Oral TID   pantoprazole  40 mg Oral Daily   thiamine  100 mg Oral Daily   Continuous Infusions:  ampicillin-sulbactam (UNASYN) IV 3 g (12/07/22 0535)     LOS: 1 day    Time spent: 35 minutes    Barb Merino, MD [facility] Pager [phone]

## 2022-12-07 NOTE — Consult Note (Signed)
Reason for Consult:right facial pain Referring Physician: Dr Deno Etienne Mary [name] is an 62 y.o. female.  HPI: hx of swelling in the right face since Wednesday. She has had dental problems in the past and had quite a number of teeth extracted secondary to her heart.  She is almost certain that this problem is her tooth and the pain is in the location of the maxillary molars.  The face is swollen in that region.  She has pain with palpation over the maxilla area.  CT scan did not show any significant pharyngeal or neck issues.  She has no trismus.  She does grind her teeth both day and night.  Past Medical History:  Diagnosis Date   Abnormal liver function    Acute diastolic heart failure ([place]) 03/15/2013   Alcoholism ([place])    Anemia    B12 deficiency    Carotid stenosis    Carotid US 2/17: bilateral ICA 1-39% >> FU prn   Cataract    Cataract    OU   Childhood asthma    Chronic diastolic CHF (congestive heart failure) (HCC)    Complete heart block (HCC)    Epigastric hernia 200's   GERD (gastroesophageal reflux disease)    Glaucoma suspect    History of blood transfusion    "14 w/1st pregnancy; 2 w/last C-section" (03/15/2013)   Hx of echocardiogram    a. Echo 4/16:  Mild focal basal septal hypertrophy, EF 60-65%, no RWMA, Mechanical MVR ok with mild central regurgitation and no perivalvular leak, small mobile density attached to valvular ring (1.5x1 cm) - post surgical changes vs SBE, mild LAE;   b. 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/2013   Complex valvuloplasty including Cor-matrix ECM patch augmentation of anterior and lateral leaflets with 270mEdwards mc3 ring annuloplasty via right mini-thoracotomy approach   Severe mitral regurgitation 04/22/2013   Sinus tachycardia    SVT (supraventricular tachycardia) 03/16/2013   TIA (transient ischemic attack)    Tricuspid regurgitation     Past Surgical History:  Procedure Laterality Date   APPENDECTOMY  ~1978   BALLOON DILATION N/A 11/27/2020   Procedure: BALLOON DILATION;  Surgeon: GeGatha MayerMD;  [facility];  Laterality: N/A;   BIOPSY  05/01/2020   Procedure: BIOPSY;  Surgeon: JaMilus BanisterMD;  [facility];  Service: [place];;   [place]   COLONOSCOPY WITH PROPOFOL N/A 05/01/2020   Procedure: COLONOSCOPY WITH PROPOFOL;  Surgeon: JaMilus BanisterMD;  [facility];  Service: Endoscopy;  Laterality: N/A;   ESOPHAGOGASTRODUODENOSCOPY (EGD) WITH PROPOFOL N/A 05/01/2020   Procedure: ESOPHAGOGASTRODUODENOSCOPY (EGD) WITH PROPOFOL;  Surgeon: JaMilus BanisterMD;  [facility];  Service: Endoscopy;  Laterality: N/A;   ESOPHAGOGASTRODUODENOSCOPY (EGD) WITH PROPOFOL N/A 11/27/2020   Procedure: ESOPHAGOGASTRODUODENOSCOPY (EGD) WITH PROPOFOL;  Surgeon: Gatha Mayer, MD;  Location: [facility] ENDOSCOPY;  Service: Endoscopy;  Laterality: N/A;   FEMORAL HERNIA REPAIR Right 05/23/2013   Procedure: HERNIA REPAIR FEMORAL;  Surgeon: Rexene Alberts, MD;  Location: [place];  Service: Open Heart Surgery;  Laterality: Right;   INTRAOPERATIVE TRANSESOPHAGEAL ECHOCARDIOGRAM N/A 05/23/2013   Procedure: INTRAOPERATIVE TRANSESOPHAGEAL ECHOCARDIOGRAM;  Surgeon: Rexene Alberts, MD;  Location: [place];  Service: Open Heart Surgery;  Laterality: N/A;   LAPAROSCOPIC CHOLECYSTECTOMY  2003   LEFT AND RIGHT HEART  CATHETERIZATION WITH CORONARY ANGIOGRAM N/A 03/22/2013   Procedure: LEFT AND RIGHT HEART CATHETERIZATION WITH CORONARY ANGIOGRAM;  Surgeon: Burnell Blanks, MD;  Location: [facility] CATH LAB;  Service: Cardiovascular;  Laterality: N/A;   MINIMALLY INVASIVE TRICUSPID VALVE REPAIR Right 05/23/2013   Procedure: MINIMALLY INVASIVE TRICUSPID VALVE REPAIR;  Surgeon: Rexene Alberts, MD;  Location: [place];  Service: Open Heart Surgery;  Laterality: Right;   MITRAL VALVE REPLACEMENT N/A 05/23/2013   Procedure: MITRAL VALVE (MV) REPLACEMENT;  Surgeon: Rexene Alberts, MD;  Location: [place];  Service: Open Heart Surgery;  Laterality: N/A;   MULTIPLE EXTRACTIONS WITH ALVEOLOPLASTY N/A 04/04/2013   Procedure: Extraction of tooth #'s 1,8,9,13,14,15,23,24,25,26 with alveoloplasty and gross debridement of remaining teeth;  Surgeon: Lenn Cal, DDS;  Location: [place];  Service: Oral Surgery;  Laterality: N/A;   TEE WITHOUT CARDIOVERSION N/A 03/18/2013   Procedure: TRANSESOPHAGEAL ECHOCARDIOGRAM (TEE);  Surgeon: Larey Dresser, MD;  Location: [place];  Service: Cardiovascular;  Laterality: N/A;   TEE WITHOUT CARDIOVERSION N/A 06/17/2013   Procedure: TRANSESOPHAGEAL ECHOCARDIOGRAM (TEE);  Surgeon: Lelon Perla, MD;  Location[facility] ENDOSCOPY;  Service: Cardiovascular;  Laterality: N/A;   TUBAL LIGATION  1999    Family History  Problem Relation Age of Onset   Kidney disease Mother        Had one Kidney removed   Stroke Father    Cancer Father    Sarcoidosis Sister    Heart attack Neg Hx    Colon cancer Neg Hx    Stomach cancer Neg Hx    Pancreatic cancer Neg Hx    Esophageal cancer Neg Hx     Social History:  reports that she quit smoking about 9 years ago. Her smoking use included cigarettes. She has a 18.00 pack-year smoking history. She has never used smokeless tobacco. She reports current alcohol use. She reports that she does not use drugs.  Allergies:  Allergies  Allergen Reactions    Ibuprofen Itching   Oxycodone Itching    Pt states no longer makes her itchy   Tape Itching, Rash and Other (See Comments)    THE ONLY TAPE THAT IS TOLERATED IS PAPER TAPE. EKG leads inflame the skin    Medications: I have reviewed the patient's current medications.  Results for orders placed or performed during the hospital encounter of 12/05/22 (from the past 48 hour(s))  Potassium     Status: Abnormal   Collection Time: 12/05/22  7:35 PM  Result Value Ref Range   Potassium 5.4 (H) 3.5 - 5.1 mmol/L    Comment: Performed at [facility], [address] [place], [place] [address]  Respiratory (~20 pathogens) panel by PCR     Status: None   Collection Time: 12/06/22  1:14 AM   Specimen: Nasopharyngeal Swab; Respiratory  Result Value Ref Range   Adenovirus NOT DETECTED NOT DETECTED   Coronavirus 229E NOT DETECTED NOT DETECTED    Comment: (NOTE) The  Coronavirus on the Respiratory Panel, DOES NOT test for the novel  Coronavirus (2019 nCoV)    Coronavirus HKU1 NOT DETECTED NOT DETECTED   Coronavirus NL63 NOT DETECTED NOT DETECTED   Coronavirus OC43 NOT DETECTED NOT DETECTED   Metapneumovirus NOT DETECTED NOT DETECTED   Rhinovirus / Enterovirus NOT DETECTED NOT DETECTED   Influenza A NOT DETECTED NOT DETECTED   Influenza B NOT DETECTED NOT DETECTED   Parainfluenza Virus 1 NOT DETECTED NOT DETECTED   Parainfluenza Virus 2 NOT DETECTED NOT DETECTED   Parainfluenza Virus 3 NOT DETECTED NOT DETECTED   Parainfluenza Virus 4 NOT DETECTED NOT DETECTED   Respiratory Syncytial Virus NOT DETECTED NOT DETECTED   Bordetella pertussis NOT DETECTED NOT DETECTED   Bordetella Parapertussis NOT DETECTED NOT DETECTED   Chlamydophila pneumoniae NOT DETECTED NOT DETECTED   Mycoplasma pneumoniae NOT DETECTED NOT DETECTED    Comment: Performed at [facility], [place] [address] [place], [place] [address]  Protime-INR     Status: Abnormal   Collection Time: 12/06/22  5:15 AM  Result Value  Ref Range   Prothrombin Time 45.3 (H) 11.4 - 15.2 seconds   INR 4.9 (HH) 0.8 - 1.2    Comment: REPEATED TO VERIFY CRITICAL RESULT CALLED TO, READ BACK BY AND VERIFIED WITH: Assunta Found, RN AT 0600 ON 12/06/22 BY H. HOWARD. (NOTE) INR goal varies based on device and disease states. Performed at [facility], [place] [address], [place], [place] [address]   CBC     Status: Abnormal   Collection Time: 12/06/22  5:15 AM  Result Value Ref Range   WBC 10.6 (H) 4.0 - 10.5 K/uL   RBC 3.16 (L) 3.87 - 5.11 MIL/uL   Hemoglobin 10.9 (L) 12.0 - 15.0 g/dL   HCT 33.7 (L) 36.0 - 46.0 %   MCV 106.6 (H) 80.0 - 100.0 fL   MCH 34.5 (H) 26.0 - 34.0 pg   MCHC 32.3 30.0 - 36.0 g/dL   RDW 14.5 11.5 - 15.5 %   Platelets 343 150 - 400 K/uL   nRBC 0.0 0.0 - 0.2 %    Comment: Performed at [facility], [place] [address], [place], [place] [id number]  Basic metabolic panel     Status: Abnormal   Collection Time: 12/06/22  5:15 AM  Result Value Ref Range   Sodium 136 135 - 145 mmol/L   Potassium 4.4 3.5 - 5.1 mmol/L   Chloride 106 98 - 111 mmol/L   CO2 21 (L) 22 - 32 mmol/L   Glucose, Bld 109 (H) 70 - 99 mg/dL    Comment: Glucose reference range applies only to samples taken after fasting for at least 8 hours.   BUN 11 6 - 20 mg/dL   Creatinine, Ser 1.32 (H) 0.44 - 1.00 mg/dL   Calcium 8.7 (L) 8.9 - 10.3 mg/dL   GFR, Estimated 46 (L) >60 mL/min    Comment: (NOTE) Calculated using the CKD-EPI Creatinine Equation (2021)    Anion gap 9 5 - 15    Comment: Performed at [facility] [address] [place], [place] [address]  Rapid urine drug screen (hospital performed)     Status: Abnormal   Collection Time: 12/06/22  5:45 AM  Result Value Ref Range   Opiates POSITIVE (A) NONE DETECTED   Cocaine NONE DETECTED NONE DETECTED   Benzodiazepines NONE DETECTED NONE DETECTED   Amphetamines NONE DETECTED NONE DETECTED   Tetrahydrocannabinol POSITIVE (A) NONE DETECTED  Barbiturates NONE DETECTED NONE  DETECTED    Comment: (NOTE) DRUG SCREEN FOR MEDICAL PURPOSES ONLY.  IF CONFIRMATION IS NEEDED FOR ANY PURPOSE, NOTIFY LAB WITHIN 5 DAYS.  LOWEST DETECTABLE LIMITS FOR URINE DRUG SCREEN Drug Class                     Cutoff (ng/mL) Amphetamine and metabolites    1000 Barbiturate and metabolites    200 Benzodiazepine                 200 Opiates and metabolites        300 Cocaine and metabolites        300 THC                            50 Performed at [facility], [place] [address], [place], [place] [address]   CBC with Differential/Platelet     Status: Abnormal   Collection Time: 12/07/22  1:10 AM  Result Value Ref Range   WBC 7.2 4.0 - 10.5 K/uL   RBC 2.87 (L) 3.87 - 5.11 MIL/uL   Hemoglobin 9.7 (L) 12.0 - 15.0 g/dL   HCT 30.9 (L) 36.0 - 46.0 %   MCV 107.7 (H) 80.0 - 100.0 fL   MCH 33.8 26.0 - 34.0 pg   MCHC 31.4 30.0 - 36.0 g/dL   RDW 14.5 11.5 - 15.5 %   Platelets 279 150 - 400 K/uL   nRBC 0.0 0.0 - 0.2 %   Neutrophils Relative % 72 %   Neutro Abs 5.2 1.7 - 7.7 K/uL   Lymphocytes Relative 18 %   Lymphs Abs 1.3 0.7 - 4.0 K/uL   Monocytes Relative 7 %   Monocytes Absolute 0.5 0.1 - 1.0 K/uL   Eosinophils Relative 3 %   Eosinophils Absolute 0.2 0.0 - 0.5 K/uL   Basophils Relative 0 %   Basophils Absolute 0.0 0.0 - 0.1 K/uL   Immature Granulocytes 0 %   Abs Immature Granulocytes 0.03 0.00 - 0.07 K/uL    Comment: Performed at [facility], [address], [place], [place] [address]  Comprehensive metabolic panel     Status: Abnormal   Collection Time: 12/07/22  1:10 AM  Result Value Ref Range   Sodium 138 135 - 145 mmol/L   Potassium 4.5 3.5 - 5.1 mmol/L   Chloride 109 98 - 111 mmol/L   CO2 23 22 - 32 mmol/L   Glucose, Bld 105 (H) 70 - 99 mg/dL    Comment: Glucose reference range applies only to samples taken after fasting for at least 8 hours.   BUN 11 6 - 20 mg/dL   Creatinine, Ser 1.28 (H) 0.44 - 1.00 mg/dL   Calcium 8.5 (L) 8.9 - 10.3 mg/dL   Total  Protein 5.8 (L) 6.5 - 8.1 g/dL   Albumin 2.9 (L) 3.5 - 5.0 g/dL   AST 23 15 - 41 U/L   ALT 14 0 - 44 U/L   Alkaline Phosphatase 89 38 - 126 U/L   Total Bilirubin 0.2 (L) 0.3 - 1.2 mg/dL   GFR, Estimated 48 (L) >60 mL/min    Comment: (NOTE) Calculated using the CKD-EPI Creatinine Equation (2021)    Anion gap 6 5 - 15    Comment: Performed at [facility], [place] [address] [place], [place] [address]  Magnesium     Status: None   Collection Time: 12/07/22  1:10 AM  Result Value Ref Range   Magnesium 1.8 1.7 - 2.4 mg/dL    Comment: Performed at [facility] [address] [place], [place] [address]  Phosphorus     Status: None   Collection Time: 12/07/22  1:10 AM  Result Value Ref Range   Phosphorus 3.6 2.5 - 4.6 mg/dL    Comment: Performed at [facility] [address], [place], [place] [address]  Protime-INR     Status: Abnormal   Collection Time: 12/07/22  1:10 AM  Result Value Ref Range   Prothrombin Time 36.0 (H) 11.4 - 15.2 seconds   INR 3.6 (H) 0.8 - 1.2    Comment: (NOTE) INR goal varies based on device and disease states. Performed at [facility], [place] [address], [place], [place] [address]   TSH     Status: None   Collection Time: 12/07/22  1:10 AM  Result Value Ref Range   TSH 1.214 0.350 - 4.500 uIU/mL    Comment: Performed by a 3rd Generation assay with a functional sensitivity of <=0.01 uIU/mL. Performed at [facility], [place] [address], [place],  [address]    *Note: Due to a large number of results and/or encounters for the requested time period, some results have not been displayed. A complete set of results can be found in Results Review.    No results found.  ROS Blood pressure 130/77, pulse 100, temperature 98 F (36.7 C), temperature source Oral, resp. rate 19, height '5\' 2"'$  (1.575 m), weight 68 kg, last menstrual period 01/28/2013, SpO2 97 %. Physical Exam HENT:     Head: Normocephalic.     Right Ear: Tympanic  membrane, ear canal and external ear normal.     Left Ear: Tympanic membrane normal.     Nose: Nose normal.     Mouth/Throat:     Comments: She is extremely tender over the right upper molar  the tooth looks bad. The face is swollen in the location and tender to palpation over the upper dental area. The pharynx looks normal with no inflammation. Normal opening of mouth Eyes:     Extraocular Movements: Extraocular movements intact.     Conjunctiva/sclera: Conjunctivae normal.     Pupils: Pupils are equal, round, and reactive to light.  Musculoskeletal:     Cervical back: Normal range of motion.  Neurological:     Mental Status: She is alert.       Assessment/Plan: Right dental abscess/infection- her epicenter of the pain is the right maxillary tooth and the face is swollen consistent with that diagnosis. She needs a dental consult to deal with the teeth.  He had teeth extracted for her heart previously.  I would agree with antibiotic therapy.  Her CT scan does not show any evidence of any significant inflammation.  There is some reactivity in the pharynx but that is reported as possible artifact and her exam of her pharynx is completely normal.  Melissa Montane 12/07/2022, 6:10 PM

## 2022-12-07 NOTE — Plan of Care (Signed)

## 2022-12-08 ENCOUNTER — Inpatient Hospital Stay (HOSPITAL_COMMUNITY): Payer: Medicaid Other

## 2022-12-08 DIAGNOSIS — R000 Tachycardia, unspecified: Secondary | ICD-10-CM | POA: Diagnosis not present

## 2022-12-08 DIAGNOSIS — R7881 Bacteremia: Secondary | ICD-10-CM

## 2022-12-08 DIAGNOSIS — K047 Periapical abscess without sinus: Secondary | ICD-10-CM | POA: Diagnosis not present

## 2022-12-08 LAB — CBC
HCT: 32.5 % — ABNORMAL LOW (ref 36.0–46.0)
Hemoglobin: 10.3 g/dL — ABNORMAL LOW (ref 12.0–15.0)
MCH: 33.7 pg (ref 26.0–34.0)
MCHC: 31.7 g/dL (ref 30.0–36.0)
MCV: 106.2 fL — ABNORMAL HIGH (ref 80.0–100.0)
Platelets: 362 10*3/uL (ref 150–400)
RBC: 3.06 MIL/uL — ABNORMAL LOW (ref 3.87–5.11)
RDW: 14 % (ref 11.5–15.5)
WBC: 6.8 10*3/uL (ref 4.0–10.5)
nRBC: 0 % (ref 0.0–0.2)

## 2022-12-08 LAB — ECHOCARDIOGRAM COMPLETE
Area-P 1/2: 5.84 cm2
Calc EF: 57.2 %
Height: 62 in
S' Lateral: 3.5 cm
Single Plane A2C EF: 59.4 %
Single Plane A4C EF: 54.8 %
Weight: 2400 oz

## 2022-12-08 LAB — PROTIME-INR
INR: 1.5 — ABNORMAL HIGH (ref 0.8–1.2)
Prothrombin Time: 17.9 seconds — ABNORMAL HIGH (ref 11.4–15.2)

## 2022-12-08 LAB — HEPARIN LEVEL (UNFRACTIONATED): Heparin Unfractionated: 0.43 IU/mL (ref 0.30–0.70)

## 2022-12-08 MED ORDER — HEPARIN BOLUS VIA INFUSION
3200.0000 [IU] | Freq: Once | INTRAVENOUS | Status: AC
  Administered 2022-12-08: 3200 [IU] via INTRAVENOUS
  Filled 2022-12-08: qty 3200

## 2022-12-08 MED ORDER — HEPARIN (PORCINE) 25000 UT/250ML-% IV SOLN
1100.0000 [IU]/h | INTRAVENOUS | Status: DC
  Administered 2022-12-08 – 2022-12-09 (×2): 950 [IU]/h via INTRAVENOUS
  Filled 2022-12-08 (×2): qty 250

## 2022-12-08 NOTE — Progress Notes (Signed)
Echocardiogram 2D Echocardiogram has been performed.  Mary [name] 12/08/2022, 3:01 PM

## 2022-12-08 NOTE — Progress Notes (Signed)
Mobility Specialist Progress Note:   12/08/22 0910  Mobility  Activity Ambulated with assistance in room  Level of Assistance Standby assist, set-up cues, supervision of patient - no hands on  Assistive Device Cane  Distance Ambulated (ft) 30 ft  Activity Response Tolerated well  $Mobility charge 1 Mobility   Pt in bathroom willing to participate in mobility. No complaints of pain. Left in bed with call bell in reach and all needs met.   [facility] Mobility Specialist Please contact via [employer] or  Rehab Office at [phone]

## 2022-12-08 NOTE — Plan of Care (Signed)
  Problem: Education: Goal: Knowledge of General Education information will improve Description: Including pain rating scale, medication(s)/side effects and non-pharmacologic comfort measures Outcome: Progressing   Problem: Activity: Goal: Risk for activity intolerance will decrease Outcome: Progressing   Problem: Nutrition: Goal: Adequate nutrition will be maintained Outcome: Progressing   Problem: Coping: Goal: Level of anxiety will decrease Outcome: Progressing   

## 2022-12-08 NOTE — Progress Notes (Signed)
PROGRESS NOTE    Mary [name]  [id number] DOB: 1961/04/18 DOA: 12/05/2022 PCP: [name], MD    Brief Narrative:  62 year old female with history of chronic diastolic heart failure, mechanical mitral valve on Coumadin, history of stroke, presented with right-sided headache, facial pain and neck pain for about 3 days.  No preceding symptoms.  Also complained of sore throat, and palpitations.  In the emergency room temperature 99.  Heart rate 120 and sinus.  Blood pressure elevated.  WC count 11.2.  Troponin is normal.  EKG with sinus tachycardia.  INR 5.1.  Flu COVID RSV and group A strep negative.  CT head with dural venous sinuses hyperdense appearance.  CT neck with pharyngeal mucosal hyperenhancement.  No TMJ involvement.  Due to persistent symptoms she was admitted to the hospital.  Started on IV fluids and pain medications. Patient has persistent dental pain and obviously infected right lower molar.  Remains sinus tachycardia.  Blood cultures and echocardiogram pending.   Assessment & Plan:   Dental pain , infected right molar teeth: With sinus tachycardia and mandibular swelling.   Persist tachycardia, concerned about infection, blood cultures pending. CT scan of the neck did not show any significant mandibular abnormalities. Dental x-ray today with dental caries but no lucencies.  Remains persistently symptomatic. Patient will need dental extraction, she also needs to be on heparin or Coumadin and this will be beneficial to be done as inpatient setting.  Trying to explore oral surgery options in the hospital. Rule out endocarditis, blood cultures pending.  Will check echocardiogram. If unable to secure inpatient procedure, will discharge patient home with Lovenox and find an appointment with dental surgery. Called and discussed case with ENT surgery.  They have seen patient at the bedside and recommended dental evaluation.  Sinus tachycardia: Likely secondary to above.   Patient also smokes marijuana.  EKG and troponins nonischemic.  Monitor. TSH normal.  Mechanical mitral valve on Coumadin, INR was supratherapeutic on presentation.  Coumadin on hold.  INR 1.5.  Will need to redose Coumadin but she will need dental extraction. Will start heparin.  Possible home on Lovenox until surgery.  Chronic diastolic heart failure, euvolemic. Hyperkalemia, probably due to dehydration.  Corrected with IV fluids.  Given a dose of Lokelma. Neuropathy, on gabapentin.   Multiple calls were made to different dental surgeons in the hospital leadership to see if we can have oral surgery procedure to be done in the hospital.   DVT prophylaxis: Coumadin.  Heparin.   Code Status: Full code Family Communication: None at the bedside Disposition Plan: Status is: Inpatient.  Persistently symptomatic.  Jaw pain.  Tachycardic.  Palpitations.   Consultants:  ENT,   Procedures:  None  Antimicrobials:  Unasyn 3/12---   Subjective:  Patient seen and examined.  Still has significant right-sided jaw pain.  She is grinding her food from left side as she cannot put any food on the right side of the mouth.  Denies any difficulty swallowing.  Remains with sinus tachycardia heart rate 90-100 on the monitor.  Remains afebrile.  She complains of palpitations especially with mobility.   Objective: Vitals:   12/07/22 2010 12/08/22 0017 12/08/22 0802 12/08/22 1227  BP: 129/75 135/71 (!) 146/88 132/88  Pulse: 99  92 96  Resp: '13  20 15  '$ Temp: 98.5 F (36.9 C) 98 F (36.7 C) 97.8 F (36.6 C) 97.8 F (36.6 C)  TempSrc: Oral Oral Oral Oral  SpO2: 100%  100% 100%  Weight:      Height:        Intake/Output Summary (Last 24 hours) at 12/08/2022 1331 Last data filed at 12/08/2022 1000 Gross per 24 hour  Intake 780 ml  Output --  Net 780 ml   Filed Weights   12/05/22 0758  Weight: 68 kg    Examination:  General exam: Slightly anxious. She has mild diffuse tenderness on  the right mandibular region.  No palpable abscess or adenopathy.  No airway compromise seen. On oral exam she has multiple carious teeth.  One of her right lower maxillary teeth is very tender and discolored. Respiratory system: Clear to auscultation. Respiratory effort normal. Cardiovascular system: S1 & S2 heard, RRR. No JVD, murmurs, rubs, gallops or clicks. No pedal edema. Gastrointestinal system: Abdomen is nondistended, soft and nontender. No organomegaly or masses felt. Normal bowel sounds heard. Central nervous system: Alert and oriented. No focal neurological deficits. Extremities: Symmetric 5 x 5 power.    Data Reviewed: I have personally reviewed following labs and imaging studies  CBC: Recent Labs  Lab 12/05/22 0824 12/06/22 0515 12/07/22 0110  WBC 11.2* 10.6* 7.2  NEUTROABS 8.8*  --  5.2  HGB 13.1 10.9* 9.7*  HCT 40.2 33.7* 30.9*  MCV 105.5* 106.6* 107.7*  PLT 401* 343 [id number]   Basic Metabolic Panel: Recent Labs  Lab 12/05/22 0824 12/05/22 1045 12/05/22 1935 12/06/22 0515 12/07/22 0110  NA 136  --   --  136 138  K 5.7* 5.4* 5.4* 4.4 4.5  CL 104  --   --  106 109  CO2 20*  --   --  21* 23  GLUCOSE 107*  --   --  109* 105*  BUN 9  --   --  11 11  CREATININE 1.18*  --   --  1.32* 1.28*  CALCIUM 9.3  --   --  8.7* 8.5*  MG 2.0  --   --   --  1.8  PHOS  --   --   --   --  3.6   GFR: Estimated Creatinine Clearance: 42.3 mL/min (A) (by C-G formula based on SCr of 1.28 mg/dL (H)). Liver Function Tests: Recent Labs  Lab 12/07/22 0110  AST 23  ALT 14  ALKPHOS 89  BILITOT 0.2*  PROT 5.8*  ALBUMIN 2.9*   No results for input(s): "LIPASE", "AMYLASE" in the last 168 hours. No results for input(s): "AMMONIA" in the last 168 hours. Coagulation Profile: Recent Labs  Lab 12/05/22 0824 12/06/22 0515 12/07/22 0110 12/08/22 0803  INR 5.1* 4.9* 3.6* 1.5*   Cardiac Enzymes: No results for input(s): "CKTOTAL", "CKMB", "CKMBINDEX", "TROPONINI" in the last 168  hours. BNP (last 3 results) No results for input(s): "PROBNP" in the last 8760 hours. HbA1C: No results for input(s): "HGBA1C" in the last 72 hours. CBG: No results for input(s): "GLUCAP" in the last 168 hours. Lipid Profile: No results for input(s): "CHOL", "HDL", "LDLCALC", "TRIG", "CHOLHDL", "LDLDIRECT" in the last 72 hours. Thyroid Function Tests: Recent Labs    12/07/22 0110  TSH 1.214   Anemia Panel: No results for input(s): "VITAMINB12", "FOLATE", "FERRITIN", "TIBC", "IRON", "RETICCTPCT" in the last 72 hours. Sepsis Labs: No results for input(s): "PROCALCITON", "LATICACIDVEN" in the last 168 hours.  Recent Results (from the past 240 hour(s))  Resp panel by RT-PCR (RSV, Flu A&B, Covid) Anterior Nasal Swab     Status: None   Collection Time: 12/05/22 11:15 AM   Specimen: Anterior Nasal Swab  Result  Value Ref Range Status   SARS Coronavirus 2 by RT PCR NEGATIVE NEGATIVE Final   Influenza A by PCR NEGATIVE NEGATIVE Final   Influenza B by PCR NEGATIVE NEGATIVE Final    Comment: (NOTE) The Xpert Xpress SARS-CoV-2/FLU/RSV plus assay is intended as an aid in the diagnosis of influenza from Nasopharyngeal swab specimens and should not be used as a sole basis for treatment. Nasal washings and aspirates are unacceptable for Xpert Xpress SARS-CoV-2/FLU/RSV testing.  Fact Sheet for Patients: EntrepreneurPulse.com.au  Fact Sheet for Healthcare Providers: IncredibleEmployment.be  This test is not yet approved or cleared by the [place] FDA and has been authorized for detection and/or diagnosis of SARS-CoV-2 by FDA under an Emergency Use Authorization (EUA). This EUA will remain in effect (meaning this test can be used) for the duration of the COVID-19 declaration under Section 564(b)(1) of the Act, 21 U.S.C. section 360bbb-3(b)(1), unless the authorization is terminated or revoked.     Resp Syncytial Virus by PCR NEGATIVE NEGATIVE  Final    Comment: (NOTE) Fact Sheet for Patients: EntrepreneurPulse.com.au  Fact Sheet for Healthcare Providers: IncredibleEmployment.be  This test is not yet approved or cleared by the [place] FDA and has been authorized for detection and/or diagnosis of SARS-CoV-2 by FDA under an Emergency Use Authorization (EUA). This EUA will remain in effect (meaning this test can be used) for the duration of the COVID-19 declaration under Section 564(b)(1) of the Act, 21 U.S.C. section 360bbb-3(b)(1), unless the authorization is terminated or revoked.  Performed at [facility], [place] [address] [place], [place] [address]   Group A Strep by PCR     Status: None   Collection Time: 12/05/22 11:15 AM   Specimen: Anterior Nasal Swab; Sterile Swab  Result Value Ref Range Status   Group A Strep by PCR NOT DETECTED NOT DETECTED Final    Comment: Performed at [facility], [place] [address] [place], [place] [address]  Respiratory (~20 pathogens) panel by PCR     Status: None   Collection Time: 12/06/22  1:14 AM   Specimen: Nasopharyngeal Swab; Respiratory  Result Value Ref Range Status   Adenovirus NOT DETECTED NOT DETECTED Final   Coronavirus 229E NOT DETECTED NOT DETECTED Final    Comment: (NOTE) The Coronavirus on the Respiratory Panel, DOES NOT test for the novel  Coronavirus (2019 nCoV)    Coronavirus HKU1 NOT DETECTED NOT DETECTED Final   Coronavirus NL63 NOT DETECTED NOT DETECTED Final   Coronavirus OC43 NOT DETECTED NOT DETECTED Final   Metapneumovirus NOT DETECTED NOT DETECTED Final   Rhinovirus / Enterovirus NOT DETECTED NOT DETECTED Final   Influenza A NOT DETECTED NOT DETECTED Final   Influenza B NOT DETECTED NOT DETECTED Final   Parainfluenza Virus 1 NOT DETECTED NOT DETECTED Final   Parainfluenza Virus 2 NOT DETECTED NOT DETECTED Final   Parainfluenza Virus 3 NOT DETECTED NOT DETECTED Final   Parainfluenza Virus 4 NOT DETECTED  NOT DETECTED Final   Respiratory Syncytial Virus NOT DETECTED NOT DETECTED Final   Bordetella pertussis NOT DETECTED NOT DETECTED Final   Bordetella Parapertussis NOT DETECTED NOT DETECTED Final   Chlamydophila pneumoniae NOT DETECTED NOT DETECTED Final   Mycoplasma pneumoniae NOT DETECTED NOT DETECTED Final    Comment: Performed at [facility], [place]. [address], [place], [place] [address]  Culture, blood (Routine X 2) w Reflex to ID Panel     Status: None (Preliminary result)   Collection Time: 12/08/22  8:03 AM   Specimen: BLOOD LEFT HAND  Result Value Ref Range Status   Specimen Description BLOOD LEFT HAND  Final   Special Requests   Final    BOTTLES DRAWN AEROBIC ONLY Blood Culture results may not be optimal due to an inadequate volume of blood received in culture bottles Performed at [facility] [address] [place], [place] [address]    Culture PENDING  Incomplete   Report Status PENDING  Incomplete         Radiology Studies: DG Orthopantogram  Result Date: 12/08/2022 CLINICAL DATA:  Dental abscess. Swelling and pain in the right face/jaw EXAM: ORTHOPANTOGRAM/PANORAMIC COMPARISON:  CT Neck 12/05/22 FINDINGS: There are multiple missing teeth along the maxillary and mandibular arches. No large periapical lucency is visualized. There is likely a dental carie along the left lateral incisor. Radiographic technique precludes assessment for the presence of a soft tissue abscess. Visualized portions of the bilateral maxillary sinuses are normal in appearance. IMPRESSION: Multiple missing teeth along the maxillary and mandibular arches. No large periapical lucency is visualized. Radiographic technique precludes assessment for a soft tissue abscess. Electronically Signed   By: Marin Roberts M.D.   On: 12/08/2022 10:04        Scheduled Meds:  acetaminophen  1,000 mg Oral TID   cyclobenzaprine  5 mg Oral TID   folic acid  1 mg Oral Daily   gabapentin  300 mg Oral TID    pantoprazole  40 mg Oral Daily   thiamine  100 mg Oral Daily   Continuous Infusions:  ampicillin-sulbactam (UNASYN) IV 3 g (12/08/22 [id number])   heparin 950 Units/hr (12/08/22 1131)     LOS: 2 days    Time spent: 35 minutes    Barb Merino, MD [facility] Pager [phone]

## 2022-12-08 NOTE — Progress Notes (Signed)
Physical Therapy Treatment Patient Details Name: Mary [name] MRN: [id number] DOB: May 22, 1961 Today's Date: 12/08/2022   History of Present Illness 62 y.o. female presents to [facility] hospital on 12/05/2022 with L sided HA, facial and neck pain. 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    PT Comments    Progressing with mobility into hallway with cane only initially then with cane and IV pole (likely furniture reliant at home) and then to practice steps though limited due to IV pole and telemetry.  She was S level on steps with cane and rail.  Feel she will continue to progress with skilled PT in the acute setting.  No follow up needs at d/c.    Recommendations for follow up therapy are one component of a multi-disciplinary discharge planning process, led by the attending physician.  Recommendations may be updated based on patient status, additional functional criteria and insurance authorization.  Follow Up Recommendations  No PT follow up     Assistance Recommended at Discharge Intermittent Supervision/Assistance  Patient can return home with the following Help with stairs or ramp for entrance   Equipment Recommendations  None recommended by PT    Recommendations for Other Services       Precautions / Restrictions Precautions Precautions: Fall     Mobility  Bed Mobility Overal bed mobility: Modified Independent                  Transfers Overall transfer level: Modified independent Equipment used: Straight cane Transfers: Sit to/from Stand Sit to Stand: Modified independent (Device/Increase time)           General transfer comment: unaided    Ambulation/Gait Ambulation/Gait assistance: Supervision Gait Distance (Feet): 250 Feet Assistive device: Straight cane, IV Pole Gait Pattern/deviations: Step-to pattern, Step-through pattern, Decreased stride length, Wide  base of support       General Gait Details: used just cane first 60' needing increased time and slower pace so allowed IV pole since practicing steps as well to avoid fatigue.   Stairs Stairs: Yes Stairs assistance: Supervision Stair Management: Step to pattern, Alternating pattern, Forwards, With cane, One rail Left Number of Stairs: 3 General stair comments: limited due to IV pole; managing without physical assist   Wheelchair Mobility    Modified Rankin (Stroke Patients Only)       Balance Overall balance assessment: Needs assistance   Sitting balance-Leahy Scale: Good       Standing balance-Leahy Scale: Fair Standing balance comment: reliant on wide BOS and prefers bilat UE support for ambulation, but managing during function with cane only with S and no LOB                            Cognition Arousal/Alertness: Awake/alert Behavior During Therapy: WFL for tasks assessed/performed Overall Cognitive Status: Within Functional Limits for tasks assessed                                          Exercises      General Comments General comments (skin integrity, edema, etc.): VSS on RA      Pertinent Vitals/Pain Pain Assessment Pain Score: 5  Pain Location: jaw, head, neck Pain Descriptors / Indicators: Aching Pain Intervention(s): Monitored during session    Home Living  Prior Function            PT Goals (current goals can now be found in the care plan section) Progress towards PT goals: Progressing toward goals    Frequency    Min 3X/week      PT Plan Current plan remains appropriate    Co-evaluation              AM-PAC PT "6 Clicks" Mobility   Outcome Measure  Help needed turning from your back to your side while in a flat bed without using bedrails?: None Help needed moving from lying on your back to sitting on the side of a flat bed without using bedrails?: None Help  needed moving to and from a bed to a chair (including a wheelchair)?: None Help needed standing up from a chair using your arms (e.g., wheelchair or bedside chair)?: None Help needed to walk in hospital room?: A Little Help needed climbing 3-5 steps with a railing? : A Little 6 Click Score: 22    End of Session Equipment Utilized During Treatment: Gait belt Activity Tolerance: Patient tolerated treatment well Patient left: in bed;with call bell/phone within reach   PT Visit Diagnosis: Other abnormalities of gait and mobility (R26.89);Muscle weakness (generalized) (M62.81);Other symptoms and signs involving the nervous system (R29.898)     Time: 1140-1200 PT Time Calculation (min) (ACUTE ONLY): 20 min  Charges:  $Gait Training: 8-22 mins                     Magda Kiel, PT Acute Rehabilitation Services [phone] 12/08/2022    Reginia Naas 12/08/2022, 6:57 PM

## 2022-12-08 NOTE — Progress Notes (Signed)
ANTICOAGULATION CONSULT NOTE - Initial Consult  Pharmacy Consult for heparin Indication:  mechanical MVR  Allergies  Allergen Reactions   Ibuprofen Itching   Oxycodone Itching    Pt states no longer makes her itchy   Tape Itching, Rash and Other (See Comments)    THE ONLY TAPE THAT IS TOLERATED IS PAPER TAPE. EKG leads inflame the skin    Patient Measurements: Height: '5\' 2"'$  (157.5 cm) Weight: 68 kg (150 lb) IBW/kg (Calculated) : 50.1 Heparin Dosing Weight: 64 kg  Vital Signs: Temp: 97.8 F (36.6 C) (03/14 0802) Temp Source: Oral (03/14 0802) BP: 146/88 (03/14 0802) Pulse Rate: 92 (03/14 0802)  Labs: Recent Labs    12/06/22 0515 12/07/22 0110 12/08/22 0803  HGB 10.9* 9.7*  --   HCT 33.7* 30.9*  --   PLT 343 279  --   LABPROT 45.3* 36.0* 17.9*  INR 4.9* 3.6* 1.5*  CREATININE 1.32* 1.28*  --     Estimated Creatinine Clearance: 42.3 mL/min (A) (by C-G formula based on SCr of 1.28 mg/dL (H)).   Medical History: Past Medical History:  Diagnosis Date   Abnormal liver function    Acute diastolic heart failure ([place]) 03/15/2013   Alcoholism ([place])    Anemia    B12 deficiency    Carotid stenosis    Carotid US 2/17: bilateral ICA 1-39% >> FU prn   Cataract    Cataract    OU   Childhood asthma    Chronic diastolic CHF (congestive heart failure) (HCC)    Complete heart block (HCC)    Epigastric hernia 200's   GERD (gastroesophageal reflux disease)    Glaucoma suspect    History of blood transfusion    "14 w/1st pregnancy; 2 w/last C-section" (03/15/2013)   Hx of echocardiogram    a. Echo 4/16:  Mild focal basal septal hypertrophy, EF 60-65%, no RWMA, Mechanical MVR ok with mild central regurgitation and no perivalvular leak, small mobile density attached to valvular ring (1.5x1 cm) - post surgical changes vs SBE, mild LAE;   b. 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   Past Week   folic acid (FOLVITE) 1 MG tablet Take 1 tablet by mouth once daily 30 tablet 0 Past Week   gabapentin (NEURONTIN) 300 MG capsule TAKE 1 CAPSULE BY MOUTH THREE TIMES DAILY 90 capsule 3 Past Week   latanoprost (XALATAN) 0.005 % ophthalmic solution Place 1 drop into both eyes at bedtime.   Past Week  Naphazoline HCl (CLEAR EYES OP) Place 2 drops into both eyes as needed (dry/irritated eyes).   Past Month   pantoprazole (PROTONIX) 40 MG tablet Take 1 tablet by mouth twice daily (Patient taking differently: Take 40 mg by mouth daily.) 180 tablet 1 Past Week   thiamine 100 MG tablet Take 1 tablet (100 mg total) by mouth daily. 100 tablet 0 Past Week   warfarin (COUMADIN) 5 MG tablet Take 1&1/2 tablets by mouth daily or as directed by the coumadin clinic. (Patient taking differently: Take 5-7.5 mg by mouth See admin instructions.  Take '5mg'$  on THU and SUN 7.'5mg'$  on all other days) 50 tablet 0 12/03/2022 at 1800   furosemide (LASIX) 20 MG tablet TAKE 1 TABLET (20 MG TOTAL) BY MOUTH DAILY. (Patient not taking:  Reported on 12/07/2022) 30 tablet 3 Not Taking    Assessment: 13 yof on warfarin PTA for mechanical MVR admitted with right facial pain and headache. Warfarin is on hold for possible dental extraction. Pharmacy consulted to begin IV heparin. INR was supratherapeutic at 5.1 on admission but has dropped to 1.5 today. No bleeding noted, Hgb stable in 9-10 range, platelets are normal.  PTA warfarin dosing: 7.5 mg/d exc 5 mg Sun/Thurs, last dose 3/9  INR goal 2.5-3.5  Goal of Therapy:  Heparin level 0.3-0.7 units/ml Monitor platelets by anticoagulation protocol: Yes   Plan:  Heparin 3200 unit IV bolus then infuse at 950 units/hr 6 hr heparin level Daily heparin level and CBC Monitor for signs/symptoms of bleeding   Thank you for involving pharmacy in this patient's care.  Renold Genta, PharmD, BCPS Clinical Pharmacist Clinical phone for 12/08/2022 is [phone] 12/08/2022 10:54 AM

## 2022-12-08 NOTE — Progress Notes (Addendum)
[name] for heparin Indication:  mechanical MVR  Allergies  Allergen Reactions   Ibuprofen Itching   Oxycodone Itching    Pt states no longer makes her itchy   Tape Itching, Rash and Other (See Comments)    THE ONLY TAPE THAT IS TOLERATED IS PAPER TAPE. EKG leads inflame the skin    Patient Measurements: Height: '5\' 2"'$  (157.5 cm) Weight: 68 kg (150 lb) IBW/kg (Calculated) : 50.1 Heparin Dosing Weight: 64 kg  Vital Signs: Temp: 98.2 F (36.8 C) (03/14 1644) Temp Source: Oral (03/14 1644) BP: 153/89 (03/14 1644) Pulse Rate: 100 (03/14 1644)  Labs: Recent Labs    12/06/22 0515 12/07/22 0110 12/08/22 0803 12/08/22 1758  HGB 10.9* 9.7*  --  10.3*  HCT 33.7* 30.9*  --  32.5*  PLT 343 279  --  362  LABPROT 45.3* 36.0* 17.9*  --   INR 4.9* 3.6* 1.5*  --   HEPARINUNFRC  --   --   --  0.43  CREATININE 1.32* 1.28*  --   --      Estimated Creatinine Clearance: 42.3 mL/min (A) (by C-G formula based on SCr of 1.28 mg/dL (H)).   Assessment: Mary [name] on warfarin PTA for mechanical MVR admitted with right facial pain and headache. Warfarin is on hold for possible dental extraction. Pharmacy consulted to dose IV heparin. INR was supratherapeutic at 5.1 on admission but has dropped to 1.5. No bleeding noted, Hgb stable in 9-10 range, platelets are normal.  Heparin level therapeutic; no bleeding reported; CBC stable.  Goal of Therapy:  Heparin level 0.3-0.7 units/ml Monitor platelets by anticoagulation protocol: Yes   Plan:  Continue heparin infusion at 950 units/hr Daily heparin level and CBC Monitor for signs/symptoms of bleeding  An Lannan D. Mina Marble, PharmD, BCPS, [place] 12/08/2022, 7:09 PM

## 2022-12-09 ENCOUNTER — Other Ambulatory Visit (HOSPITAL_COMMUNITY): Payer: Self-pay

## 2022-12-09 DIAGNOSIS — R000 Tachycardia, unspecified: Secondary | ICD-10-CM | POA: Diagnosis not present

## 2022-12-09 LAB — CBC
HCT: 30.3 % — ABNORMAL LOW (ref 36.0–46.0)
Hemoglobin: 10 g/dL — ABNORMAL LOW (ref 12.0–15.0)
MCH: 34.6 pg — ABNORMAL HIGH (ref 26.0–34.0)
MCHC: 33 g/dL (ref 30.0–36.0)
MCV: 104.8 fL — ABNORMAL HIGH (ref 80.0–100.0)
Platelets: 342 10*3/uL (ref 150–400)
RBC: 2.89 MIL/uL — ABNORMAL LOW (ref 3.87–5.11)
RDW: 14 % (ref 11.5–15.5)
WBC: 5.3 10*3/uL (ref 4.0–10.5)
nRBC: 0 % (ref 0.0–0.2)

## 2022-12-09 LAB — HEPARIN LEVEL (UNFRACTIONATED): Heparin Unfractionated: 0.18 IU/mL — ABNORMAL LOW (ref 0.30–0.70)

## 2022-12-09 MED ORDER — ENOXAPARIN SODIUM 80 MG/0.8ML IJ SOSY
70.0000 mg | PREFILLED_SYRINGE | Freq: Two times a day (BID) | INTRAMUSCULAR | Status: DC
  Administered 2022-12-09: 70 mg via SUBCUTANEOUS
  Filled 2022-12-09: qty 0.8

## 2022-12-09 MED ORDER — HYDROCODONE-ACETAMINOPHEN 5-325 MG PO TABS
1.0000 | ORAL_TABLET | ORAL | 0 refills | Status: AC | PRN
  Filled 2022-12-09: qty 20, 4d supply, fill #0

## 2022-12-09 MED ORDER — AMOXICILLIN-POT CLAVULANATE 875-125 MG PO TABS
1.0000 | ORAL_TABLET | Freq: Two times a day (BID) | ORAL | 0 refills | Status: AC
  Filled 2022-12-09: qty 14, 7d supply, fill #0

## 2022-12-09 MED ORDER — WARFARIN SODIUM 5 MG PO TABS: 5.0000 mg | ORAL_TABLET | ORAL | Status: DC

## 2022-12-09 MED ORDER — WARFARIN SODIUM 5 MG PO TABS: 7.5000 mg | ORAL_TABLET | ORAL | Status: DC

## 2022-12-09 MED ORDER — ENOXAPARIN SODIUM 80 MG/0.8ML IJ SOSY
70.0000 mg | PREFILLED_SYRINGE | Freq: Two times a day (BID) | INTRAMUSCULAR | 0 refills | Status: DC
  Filled 2022-12-09: qty 8, 6d supply, fill #0

## 2022-12-09 MED ORDER — WARFARIN SODIUM 7.5 MG PO TABS: 7.5000 mg | ORAL_TABLET | ORAL | Status: DC

## 2022-12-09 MED ORDER — WARFARIN - PHARMACIST DOSING INPATIENT: Freq: Every day | Status: DC

## 2022-12-09 NOTE — Progress Notes (Signed)
CSW informed patient needs transportation home- taxi voucher provided

## 2022-12-09 NOTE — Progress Notes (Signed)
[place] for heparin Indication:  mechanical MVR  Allergies  Allergen Reactions   Ibuprofen Itching   Oxycodone Itching    Pt states no longer makes her itchy   Tape Itching, Rash and Other (See Comments)    THE ONLY TAPE THAT IS TOLERATED IS PAPER TAPE. EKG leads inflame the skin    Patient Measurements: Height: 5\' 2"  (157.5 cm) Weight: 68 kg (150 lb) IBW/kg (Calculated) : 50.1 Heparin Dosing Weight: 64 kg  Vital Signs: Temp: 97.9 F (36.6 C) (03/15 0359) Temp Source: Oral (03/15 0359) BP: 142/80 (03/15 0359) Pulse Rate: 92 (03/15 0359)  Labs: Recent Labs    12/07/22 0110 12/08/22 0803 12/08/22 1758 12/09/22 0621  HGB 9.7*  --  10.3* 10.0*  HCT 30.9*  --  32.5* 30.3*  PLT 279  --  362 342  LABPROT 36.0* 17.9*  --   --   INR 3.6* 1.5*  --   --   HEPARINUNFRC  --   --  0.43 0.18*  CREATININE 1.28*  --   --   --      Estimated Creatinine Clearance: 42.3 mL/min (A) (by C-G formula based on SCr of 1.28 mg/dL (H)).   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 Warfarin is on hold for possible dental extraction. Pharmacy consulted to dose IV heparin. INR was supratherapeutic at 5.1 on admission but has dropped to 1.5. No bleeding noted, Hgb stable in 9-10 range, platelets are normal.  Heparin level low this AM  Goal of Therapy:  Heparin level 0.3-0.7 units/ml Monitor platelets by anticoagulation protocol: Yes   Plan:  Increase heparin to 1100 units / hr Heparin level in 8 hours Daily heparin level and CBC Monitor for signs/symptoms of bleeding  Thank you Anette Guarneri, PharmD 12/09/2022, 7:59 AM

## 2022-12-09 NOTE — Discharge Summary (Signed)
Physician Discharge Summary  Mary [name] [id number] DOB: October 02, 1960 DOA: 12/05/2022  PCP: [name], MD  Admit date: 12/05/2022 Discharge date: 12/09/2022  Admitted From: Home Disposition: Home  Recommendations for Outpatient Follow-up:  Follow up with PCP in 1-2 weeks Please obtain INR in 3 to 4 days.  Discharge Condition: Stable CODE STATUS: Full code Diet recommendation: Regular diet  Discharge summary: 62 year old female with history of chronic diastolic heart failure, mechanical mitral valve on Coumadin, history of stroke, presented with right-sided headache, facial pain and neck pain for about 3 days.  No preceding symptoms.  Also complained of sore throat, and palpitations.  In the emergency room temperature 99.  Heart rate 120 and sinus.  Blood pressure elevated.  WBC count 11.2.  Troponin is normal.  EKG with sinus tachycardia.  INR 5.1.  Flu COVID RSV and group A strep negative.  CT head with dural venous sinuses hyperdense appearance.  CT neck with pharyngeal mucosal hyperenhancement.  No TMJ involvement.  Due to persistent symptoms she was admitted to the hospital.  Started on IV fluids and pain medications. Patient had persistent dental pain and was found to have extensive carrie teeth.  Dental pain , infected right molar teeth: With sinus tachycardia and mandibular swelling.   Due to persistent tachycardia and jaw pain, blood cultures were obtained and they were negative.   CT scan of the neck did not show any significant mandibular abnormalities. Dental x-ray today with dental caries but no lucencies.   Treated with IV Unasyn.  Today with some clinical improvement. Unable to secure oral surgery or dental surgery in the hospital for treatment of carious teeth, referral made to oral surgery on discharge. Also seen by ENT surgery, mostly related to dental pain. Discharging home with oral antibiotics, Augmentin for additional 7 days, adequate pain medications and  outpatient oral surgery follow-up.   Sinus tachycardia: Likely secondary to above.  Patient also smokes marijuana.  EKG and troponins nonischemic.  TSH normal. Improved now.   Mechanical mitral valve on Coumadin, INR was supratherapeutic on presentation.  Coumadin was held in case patient needs surgery.  Inpatient surgery could not be arranged.  Discharge home with Lovenox 70 mg twice daily along with starting Coumadin.     Chronic diastolic heart failure, euvolemic. Hyperkalemia, probably due to dehydration.  Corrected with IV fluids.  Given a dose of Lokelma. Neuropathy, on gabapentin.  Patient is medically improving.  Case discussed with multiple dental and oral surgery providers as outpatient.  Since patient is clinically improving, she will keep up outpatient follow-up.     Discharge Diagnoses:  Principal Problem:   Tachycardia Active Problems:   TMJ dysfunction   S/P mitral valve replacement with metallic valve   (HFpEF) heart failure with preserved ejection fraction (HCC)   Supratherapeutic INR   Hyperkalemia   Pharyngitis   Neuropathy    Discharge Instructions  Discharge Instructions     Call MD for:  severe uncontrolled pain   Complete by: As directed    Call MD for:  temperature >100.4   Complete by: As directed    Diet - low sodium heart healthy   Complete by: As directed    Discharge instructions   Complete by: As directed    Make an appointment with the dental surgery to take care of your teeth problems   Increase activity slowly   Complete by: As directed       Allergies as of 12/09/2022  Reactions   Ibuprofen Itching   Oxycodone Itching   Pt states no longer makes her itchy   Tape Itching, Rash, Other (See Comments)   THE ONLY TAPE THAT IS TOLERATED IS PAPER TAPE. EKG leads inflame the skin        Medication List     STOP taking these medications    furosemide 20 MG tablet Commonly known as: LASIX       TAKE these medications     acetaminophen 500 MG tablet Commonly known as: TYLENOL Take 1,000 mg by mouth in the morning, at noon, and at bedtime.   amoxicillin-clavulanate 875-125 MG tablet Commonly known as: AUGMENTIN Take 1 tablet by mouth 2 (two) times daily for 7 days.   CLEAR EYES OP Place 2 drops into both eyes as needed (dry/irritated eyes).   enoxaparin 80 MG/0.8ML injection Commonly known as: LOVENOX Inject 0.7 mLs (70 mg total) into the skin every 12 (twelve) hours for 5 days.   folic acid 1 MG tablet Commonly known as: FOLVITE Take 1 tablet by mouth once daily   gabapentin 300 MG capsule Commonly known as: NEURONTIN TAKE 1 CAPSULE BY MOUTH THREE TIMES DAILY   HYDROcodone-acetaminophen 5-325 MG tablet Commonly known as: NORCO/VICODIN Take 1 tablet by mouth every 4 (four) hours as needed for up to 5 days for moderate pain or severe pain.   latanoprost 0.005 % ophthalmic solution Commonly known as: XALATAN Place 1 drop into both eyes at bedtime.   pantoprazole 40 MG tablet Commonly known as: PROTONIX Take 1 tablet by mouth twice daily What changed: when to take this   thiamine 100 MG tablet Commonly known as: VITAMIN B1 Take 1 tablet (100 mg total) by mouth daily.   warfarin 7.5 MG tablet Commonly known as: COUMADIN Take as directed. If you are unsure how to take this medication, talk to your nurse or doctor. Original instructions: Take 1 tablet (7.5 mg total) by mouth every Monday,Wednesday,Friday, and Sunday at 6 PM. What changed: You were already taking a medication with the same name, and this prescription was added. Make sure you understand how and when to take each.   warfarin 5 MG tablet Commonly known as: COUMADIN Take as directed. If you are unsure how to take this medication, talk to your nurse or doctor. Original instructions: Take 1 tablet (5 mg total) by mouth every Tuesday, Thursday, and Saturday at 6 PM. Start taking on: December 10, 2022 What changed:  how much to  take how to take this when to take this additional instructions        Follow-up Information     [name], MD Follow up in 2 week(s).   Specialty: Internal Medicine Why: please check INR 3/18 Contact information: [place] [address] [phone]         Frederik Schmidt, MD. Schedule an appointment as soon as possible for a visit.   Specialty: Oral Surgery Contact information: [address] [place] [address] [phone]                Allergies  Allergen Reactions   Ibuprofen Itching   Oxycodone Itching    Pt states no longer makes her itchy   Tape Itching, Rash and Other (See Comments)    THE ONLY TAPE THAT IS TOLERATED IS PAPER TAPE. EKG leads inflame the skin    Consultations: None   Procedures/Studies: ECHOCARDIOGRAM COMPLETE  Result Date: 12/08/2022    ECHOCARDIOGRAM REPORT  Patient Name:   KENSLEIGH SHAWVER Date of Exam: 12/08/2022 Medical Rec #:  [id number]       Height:       62.0 in Accession #:    [id number]      Weight:       150.0 lb Date of Birth:  [birth year]      BSA:          1.692 m Patient Age:    56 years        BP:           132/88 mmHg Patient Gender: F               HR:           100 bpm. Exam Location:  Inpatient Procedure: 2D Echo, Cardiac Doppler and Color Doppler Indications:    bacteremia  History:        Patient has prior history of Echocardiogram examinations. CHF,                 TIA; Risk Factors:Hypertension.                  Mitral Valve: 33 mm [employer] Optiform mechanical valve                 valve is present in the mitral position. Procedure Date:                 05/23/13.  Sonographer:    Phineas Douglas Referring Phys: [id number] [place]  1. Prosthetic MV appears to be functioning normally with mean gradient 4 mmHg and trace MR.  2. Left ventricular ejection fraction, by estimation, is 50 to 55%. The left ventricle has low normal function. The left ventricle has  no regional wall motion abnormalities. There is mild concentric left ventricular hypertrophy. Left ventricular diastolic parameters are indeterminate.  3. Right ventricular systolic function is normal. The right ventricular size is normal.  4. Left atrial size was moderately dilated.  5. The mitral valve has been repaired/replaced. Trivial mitral valve regurgitation. No evidence of mitral stenosis. There is a 33 mm [employer] Optiform mechanical valve present in the mitral position. Procedure Date: 05/23/13.  6. The tricuspid valve is has been repaired/replaced.  7. The aortic valve is tricuspid. Aortic valve regurgitation is not visualized. No aortic stenosis is present.  8. The inferior vena cava is normal in size with greater than 50% respiratory variability, suggesting right atrial pressure of 3 mmHg. FINDINGS  Left Ventricle: Left ventricular ejection fraction, by estimation, is 50 to 55%. The left ventricle has low normal function. The left ventricle has no regional wall motion abnormalities. The left ventricular internal cavity size was normal in size. There is mild concentric left ventricular hypertrophy. Left ventricular diastolic function could not be evaluated due to mitral valve replacement. Left ventricular diastolic parameters are indeterminate. Right Ventricle: The right ventricular size is normal. Right ventricular systolic function is normal. Left Atrium: Left atrial size was moderately dilated. Right Atrium: Right atrial size was normal in size. Pericardium: Trivial pericardial effusion is present. Mitral Valve: The mitral valve has been repaired/replaced. Trivial mitral valve regurgitation. There is a 33 mm [employer] Optiform mechanical valve present in the mitral position. Procedure Date: 05/23/13. No evidence of mitral valve stenosis. Tricuspid Valve: The tricuspid valve is has been repaired/replaced. Tricuspid valve regurgitation is not demonstrated. No evidence of tricuspid  stenosis. Aortic Valve: The aortic valve is tricuspid. Aortic valve regurgitation  is not visualized. No aortic stenosis is present. Pulmonic Valve: The pulmonic valve was normal in structure. Pulmonic valve regurgitation is not visualized. No evidence of pulmonic stenosis. Aorta: The aortic root is normal in size and structure. Venous: The inferior vena cava is normal in size with greater than 50% respiratory variability, suggesting right atrial pressure of 3 mmHg. IAS/Shunts: No atrial level shunt detected by color flow Doppler. Additional Comments: Prosthetic MV appears to be functioning normally with mean gradient 4 mmHg and trace MR.  LEFT VENTRICLE PLAX 2D LVIDd:         4.50 cm      Diastology LVIDs:         3.50 cm      LV e' medial:    5.22 cm/s LV PW:         1.30 cm      LV E/e' medial:  27.2 LV IVS:        1.20 cm      LV e' lateral:   5.11 cm/s LVOT diam:     1.80 cm      LV E/e' lateral: 27.8 LV SV:         30 LV SV Index:   18 LVOT Area:     2.54 cm  LV Volumes (MOD) LV vol d, MOD A2C: 94.1 ml LV vol d, MOD A4C: 101.0 ml LV vol s, MOD A2C: 38.2 ml LV vol s, MOD A4C: 45.7 ml LV SV MOD A2C:     55.9 ml LV SV MOD A4C:     101.0 ml LV SV MOD BP:      56.2 ml RIGHT VENTRICLE             IVC RV Basal diam:  4.00 cm     IVC diam: 2.10 cm RV S prime:     10.60 cm/s TAPSE (M-mode): 1.7 cm LEFT ATRIUM             Index        RIGHT ATRIUM           Index LA Vol (A2C):   86.0 ml 50.84 ml/m  RA Area:     17.20 cm LA Vol (A4C):   70.0 ml 41.38 ml/m  RA Volume:   45.70 ml  27.01 ml/m LA Biplane Vol: 78.6 ml 46.46 ml/m  AORTIC VALVE LVOT Vmax:   72.70 cm/s LVOT Vmean:  47.700 cm/s LVOT VTI:    0.117 m  AORTA Ao Root diam: 2.70 cm Ao Asc diam:  2.50 cm MITRAL VALVE MV Area (PHT): 5.84 cm     SHUNTS MV Decel Time: 130 msec     Systemic VTI:  0.12 m MV E velocity: 142.00 cm/s  Systemic Diam: 1.80 cm Kirk Ruths MD Electronically signed by Kirk Ruths MD Signature Date/Time: 12/08/2022/4:43:59 PM    Final     DG Orthopantogram  Result Date: 12/08/2022 CLINICAL DATA:  Dental abscess. Swelling and pain in the right face/jaw EXAM: ORTHOPANTOGRAM/PANORAMIC COMPARISON:  CT Neck 12/05/22 FINDINGS: There are multiple missing teeth along the maxillary and mandibular arches. No large periapical lucency is visualized. There is likely a dental carie along the left lateral incisor. Radiographic technique precludes assessment for the presence of a soft tissue abscess. Visualized portions of the bilateral maxillary sinuses are normal in appearance. IMPRESSION: Multiple missing teeth along the maxillary and mandibular arches. No large periapical lucency is visualized. Radiographic technique precludes assessment for a soft tissue abscess. Electronically Signed  By: Marin Roberts M.D.   On: 12/08/2022 10:04   CT Head Wo Contrast  Result Date: 12/05/2022 CLINICAL DATA:  Headache EXAM: CT HEAD WITHOUT CONTRAST TECHNIQUE: Contiguous axial images were obtained from the base of the skull through the vertex without intravenous contrast. RADIATION DOSE REDUCTION: This exam was performed according to the departmental dose-optimization program which includes automated exposure control, adjustment of the mA and/or kV according to patient size and/or use of iterative reconstruction technique. COMPARISON:  CT NEck 12/05/22, CT head 01/30/21 FINDINGS: Brain: No hydrocephalus. No extra-axial fluid collection. No hemorrhage. No CT evidence of an acute infarct. No mass effect. No midline shift there is sequela of mild chronic microvascular ischemic change. Vascular: Diffusely hyperdense appearance of the dural venous sinuses is likely secondary to administration of iodinated contrast for same day CT neck performed at 10:30 a.m. Skull: Normal. Negative for fracture or focal lesion. Sinuses/Orbits: No mastoid or middle ear effusion. Paranasal sinuses are clear. Orbits are unremarkable. Other: None IMPRESSION: 1. No specific finding to explain  headache. 2. Diffusely hyperdense appearance of the dural venous sinuses is likely secondary to administration of iodinated contrast for same day CT neck performed at 10:30 AM. Electronically Signed   By: Marin Roberts M.D.   On: 12/05/2022 14:44   CT Soft Tissue Neck W Contrast  Result Date: 12/05/2022 CLINICAL DATA:  62 year old female with right side jaw and neck pain. Arm pain. Chest pain. EXAM: CT NECK WITH CONTRAST TECHNIQUE: Multidetector CT imaging of the neck was performed using the standard protocol following the bolus administration of intravenous contrast. RADIATION DOSE REDUCTION: This exam was performed according to the departmental dose-optimization program which includes automated exposure control, adjustment of the mA and/or kV according to patient size and/or use of iterative reconstruction technique. CONTRAST:  61mL OMNIPAQUE IOHEXOL 350 MG/ML SOLN COMPARISON:  Cervical spine CT 08/14/2020. FINDINGS: Pharynx and larynx: Larynx and pharynx soft tissue contours appears stable since 2021 and within normal limits. Negative parapharyngeal and retropharyngeal spaces. Questionable generalized pharyngeal mucosal space hyperenhancement (such as on series 3, image 29), but may be artifact. Salivary glands: Negative sublingual space. Chronically atrophied left submandibular gland, stable. Right submandibular gland appears stable and normal. Bilateral parotid glands appear symmetric and normal. Thyroid: Negative. Lymph nodes: Negative. Small bilateral cervical lymph nodes appear stable since 2021 and within normal limits. Vascular: Major vascular structures in the neck and at the skull base are patent with mild motion artifact. No significant cervical carotid atherosclerosis. Limited intracranial: Negative. Visualized orbits: Negative. Mastoids and visualized paranasal sinuses: Mild maxillary sinus mucosal thickening and/or retention cyst. No sinus fluid levels. Other Visualized paranasal sinuses and  mastoids are clear. Skeleton: No acute dental finding. No asymmetric TMJ degeneration. Mandible intact. Mild for age cervical spine degeneration, also stable since 2021. No acute osseous abnormality identified. Upper chest: Negative. IMPRESSION: 1. Questionable generalized pharyngeal mucosal space hyperenhancement. This may be artifact but consider pharyngitis/mucositis. 2. No other acute or inflammatory process identified in the Neck. And mild for age cervical spine degeneration. Electronically Signed   By: Genevie Ann M.D.   On: 12/05/2022 10:46   DG Chest 2 View  Result Date: 12/05/2022 CLINICAL DATA:  Chest pain EXAM: CHEST - 2 VIEW COMPARISON:  07/18/2022 FINDINGS: Enlargement of cardiac silhouette post MVR and TVR. Mediastinal contours and pulmonary vascularity normal. Lungs clear. No pulmonary infiltrate, pleural effusion, or pneumothorax. Osseous structures unremarkable. IMPRESSION: Enlargement of cardiac silhouette post MVR and TVR. No acute abnormalities. Electronically Signed  By: Lavonia Dana M.D.   On: 12/05/2022 09:03   (Echo, Carotid, EGD, Colonoscopy, ERCP)    Subjective: Patient seen in the morning rounds.  Today her pain is controlled and occasionally using pain medication.  Able to chew normally and swallow.  Afebrile.  Heart rate mostly less than 90.  We discussed extensively about using Lovenox and Coumadin as not sure when she will have dental appointment available.   Discharge Exam: Vitals:   12/09/22 0359 12/09/22 0700  BP: (!) 142/80 (!) 154/86  Pulse: 92 100  Resp: 16 19  Temp: 97.9 F (36.6 C) 98 F (36.7 C)  SpO2: 99% 96%   Vitals:   12/08/22 1935 12/08/22 2333 12/09/22 0359 12/09/22 0700  BP: (!) 150/78 (!) 144/80 (!) 142/80 (!) 154/86  Pulse: 72 95 92 100  Resp: 18 20 16 19   Temp: 97.9 F (36.6 C) 98 F (36.7 C) 97.9 F (36.6 C) 98 F (36.7 C)  TempSrc: Oral Oral Oral Oral  SpO2: 98% 100% 99% 96%  Weight:      Height:        General: Pt is alert, awake,  not in acute distress Cardiovascular: RRR, S1/S2 +, no rubs, no gallops Respiratory: CTA bilaterally, no wheezing, no rhonchi Abdominal: Soft, NT, ND, bowel sounds + Extremities: no edema, no cyanosis  Can open mouth fully.  Slight tenderness along the right mandibular region.  Multiple caries teeth present.  No palpable abscess or drainage.    The results of significant diagnostics from this hospitalization (including imaging, microbiology, ancillary and laboratory) are listed below for reference.     Microbiology: Recent Results (from the past 240 hour(s))  Resp panel by RT-PCR (RSV, Flu A&B, Covid) Anterior Nasal Swab     Status: None   Collection Time: 12/05/22 11:15 AM   Specimen: Anterior Nasal Swab  Result Value Ref Range Status   SARS Coronavirus 2 by RT PCR NEGATIVE NEGATIVE Final   Influenza A by PCR NEGATIVE NEGATIVE Final   Influenza B by PCR NEGATIVE NEGATIVE Final    Comment: (NOTE) The Xpert Xpress SARS-CoV-2/FLU/RSV plus assay is intended as an aid in the diagnosis of influenza from Nasopharyngeal swab specimens and should not be used as a sole basis for treatment. Nasal washings and aspirates are unacceptable for Xpert Xpress SARS-CoV-2/FLU/RSV testing.  Fact Sheet for Patients: EntrepreneurPulse.com.au  Fact Sheet for Healthcare Providers: IncredibleEmployment.be  This test is not yet approved or cleared by the [place] FDA and has been authorized for detection and/or diagnosis of SARS-CoV-2 by FDA under an Emergency Use Authorization (EUA). This EUA will remain in effect (meaning this test can be used) for the duration of the COVID-19 declaration under Section 564(b)(1) of the Act, 21 U.S.C. section 360bbb-3(b)(1), unless the authorization is terminated or revoked.     Resp Syncytial Virus by PCR NEGATIVE NEGATIVE Final    Comment: (NOTE) Fact Sheet for Patients: EntrepreneurPulse.com.au  Fact  Sheet for Healthcare Providers: IncredibleEmployment.be  This test is not yet approved or cleared by the [place] FDA and has been authorized for detection and/or diagnosis of SARS-CoV-2 by FDA under an Emergency Use Authorization (EUA). This EUA will remain in effect (meaning this test can be used) for the duration of the COVID-19 declaration under Section 564(b)(1) of the Act, 21 U.S.C. section 360bbb-3(b)(1), unless the authorization is terminated or revoked.  Performed at [facility], [place] [address] [place], [place] [address]   Group A Strep by PCR  Status: None   Collection Time: 12/05/22 11:15 AM   Specimen: Anterior Nasal Swab; Sterile Swab  Result Value Ref Range Status   Group A Strep by PCR NOT DETECTED NOT DETECTED Final    Comment: Performed at [facility], [place] [address], [place], [place] [address]  Respiratory (~20 pathogens) panel by PCR     Status: None   Collection Time: 12/06/22  1:14 AM   Specimen: Nasopharyngeal Swab; Respiratory  Result Value Ref Range Status   Adenovirus NOT DETECTED NOT DETECTED Final   Coronavirus 229E NOT DETECTED NOT DETECTED Final    Comment: (NOTE) The Coronavirus on the Respiratory Panel, DOES NOT test for the novel  Coronavirus (2019 nCoV)    Coronavirus HKU1 NOT DETECTED NOT DETECTED Final   Coronavirus NL63 NOT DETECTED NOT DETECTED Final   Coronavirus OC43 NOT DETECTED NOT DETECTED Final   Metapneumovirus NOT DETECTED NOT DETECTED Final   Rhinovirus / Enterovirus NOT DETECTED NOT DETECTED Final   Influenza A NOT DETECTED NOT DETECTED Final   Influenza B NOT DETECTED NOT DETECTED Final   Parainfluenza Virus 1 NOT DETECTED NOT DETECTED Final   Parainfluenza Virus 2 NOT DETECTED NOT DETECTED Final   Parainfluenza Virus 3 NOT DETECTED NOT DETECTED Final   Parainfluenza Virus 4 NOT DETECTED NOT DETECTED Final   Respiratory Syncytial Virus NOT DETECTED NOT DETECTED Final   Bordetella  pertussis NOT DETECTED NOT DETECTED Final   Bordetella Parapertussis NOT DETECTED NOT DETECTED Final   Chlamydophila pneumoniae NOT DETECTED NOT DETECTED Final   Mycoplasma pneumoniae NOT DETECTED NOT DETECTED Final    Comment: Performed at [facility], [place]. [address] [place], [place] [address]  Culture, blood (Routine X 2) w Reflex to ID Panel     Status: None (Preliminary result)   Collection Time: 12/08/22  7:57 AM   Specimen: BLOOD RIGHT HAND  Result Value Ref Range Status   Specimen Description BLOOD RIGHT HAND  Final   Special Requests   Final    BOTTLES DRAWN AEROBIC AND ANAEROBIC Blood Culture results may not be optimal due to an inadequate volume of blood received in culture bottles   Culture   Final    NO GROWTH < 24 HOURS Performed at [facility], [place] [address], [place], [place] [address]    Report Status PENDING  Incomplete  Culture, blood (Routine X 2) w Reflex to ID Panel     Status: None (Preliminary result)   Collection Time: 12/08/22  8:03 AM   Specimen: BLOOD LEFT HAND  Result Value Ref Range Status   Specimen Description BLOOD LEFT HAND  Final   Special Requests   Final    BOTTLES DRAWN AEROBIC ONLY Blood Culture results may not be optimal due to an inadequate volume of blood received in culture bottles   Culture   Final    NO GROWTH < 24 HOURS Performed at [facility], [place] [address] [place], [place] [address]    Report Status PENDING  Incomplete     Labs: BNP (last 3 results) No results for input(s): "BNP" in the last 8760 hours. Basic Metabolic Panel: Recent Labs  Lab 12/05/22 0824 12/05/22 1045 12/05/22 1935 12/06/22 0515 12/07/22 0110  NA 136  --   --  136 138  K 5.7* 5.4* 5.4* 4.4 4.5  CL 104  --   --  106 109  CO2 20*  --   --  21* 23  GLUCOSE  107*  --   --  109* 105*  BUN 9  --   --  11 11  CREATININE 1.18*  --   --  1.32* 1.28*  CALCIUM 9.3  --   --  8.7* 8.5*  MG 2.0  --   --   --  1.8  PHOS  --   --   --   --   3.6   Liver Function Tests: Recent Labs  Lab 12/07/22 0110  AST 23  ALT 14  ALKPHOS 89  BILITOT 0.2*  PROT 5.8*  ALBUMIN 2.9*   No results for input(s): "LIPASE", "AMYLASE" in the last 168 hours. No results for input(s): "AMMONIA" in the last 168 hours. CBC: Recent Labs  Lab 12/05/22 0824 12/06/22 0515 12/07/22 0110 12/08/22 1758 12/09/22 0621  WBC 11.2* 10.6* 7.2 6.8 5.3  NEUTROABS 8.8*  --  5.2  --   --   HGB 13.1 10.9* 9.7* 10.3* 10.0*  HCT 40.2 33.7* 30.9* 32.5* 30.3*  MCV 105.5* 106.6* 107.7* 106.2* 104.8*  PLT 401* 343 279 362 342   Cardiac Enzymes: No results for input(s): "CKTOTAL", "CKMB", "CKMBINDEX", "TROPONINI" in the last 168 hours. BNP: Invalid input(s): "POCBNP" CBG: No results for input(s): "GLUCAP" in the last 168 hours. D-Dimer No results for input(s): "DDIMER" in the last 72 hours. Hgb A1c No results for input(s): "HGBA1C" in the last 72 hours. Lipid Profile No results for input(s): "CHOL", "HDL", "LDLCALC", "TRIG", "CHOLHDL", "LDLDIRECT" in the last 72 hours. Thyroid function studies Recent Labs    12/07/22 0110  TSH 1.214   Anemia work up No results for input(s): "VITAMINB12", "FOLATE", "FERRITIN", "TIBC", "IRON", "RETICCTPCT" in the last 72 hours. Urinalysis    Component Value Date/Time   COLORURINE YELLOW 07/19/2022 0005   APPEARANCEUR CLEAR 07/19/2022 0005   LABSPEC 1.012 07/19/2022 0005   PHURINE 6.0 07/19/2022 0005   GLUCOSEU NEGATIVE 07/19/2022 0005   HGBUR SMALL (A) 07/19/2022 0005   BILIRUBINUR NEGATIVE 07/19/2022 0005   KETONESUR NEGATIVE 07/19/2022 0005   PROTEINUR 100 (A) 07/19/2022 0005   UROBILINOGEN 0.2 06/04/2015 1533   NITRITE NEGATIVE 07/19/2022 0005   LEUKOCYTESUR NEGATIVE 07/19/2022 0005   Sepsis Labs Recent Labs  Lab 12/06/22 0515 12/07/22 0110 12/08/22 1758 12/09/22 0621  WBC 10.6* 7.2 6.8 5.3   Microbiology Recent Results (from the past 240 hour(s))  Resp panel by RT-PCR (RSV, Flu A&B, Covid)  Anterior Nasal Swab     Status: None   Collection Time: 12/05/22 11:15 AM   Specimen: Anterior Nasal Swab  Result Value Ref Range Status   SARS Coronavirus 2 by RT PCR NEGATIVE NEGATIVE Final   Influenza A by PCR NEGATIVE NEGATIVE Final   Influenza B by PCR NEGATIVE NEGATIVE Final    Comment: (NOTE) The Xpert Xpress SARS-CoV-2/FLU/RSV plus assay is intended as an aid in the diagnosis of influenza from Nasopharyngeal swab specimens and should not be used as a sole basis for treatment. Nasal washings and aspirates are unacceptable for Xpert Xpress SARS-CoV-2/FLU/RSV testing.  Fact Sheet for Patients: EntrepreneurPulse.com.au  Fact Sheet for Healthcare Providers: IncredibleEmployment.be  This test is not yet approved or cleared by the [place] FDA and has been authorized for detection and/or diagnosis of SARS-CoV-2 by FDA under an Emergency Use Authorization (EUA). This EUA will remain in effect (meaning this test can be used) for the duration of the COVID-19 declaration under Section 564(b)(1) of the Act, 21 U.S.C. section 360bbb-3(b)(1), unless the authorization is terminated or revoked.  Resp Syncytial Virus by PCR NEGATIVE NEGATIVE Final    Comment: (NOTE) Fact Sheet for Patients: EntrepreneurPulse.com.au  Fact Sheet for Healthcare Providers: IncredibleEmployment.be  This test is not yet approved or cleared by the [place] FDA and has been authorized for detection and/or diagnosis of SARS-CoV-2 by FDA under an Emergency Use Authorization (EUA). This EUA will remain in effect (meaning this test can be used) for the duration of the COVID-19 declaration under Section 564(b)(1) of the Act, 21 U.S.C. section 360bbb-3(b)(1), unless the authorization is terminated or revoked.  Performed at [facility], [place] [address] [place], [place] [address]   Group A Strep by PCR     Status: None    Collection Time: 12/05/22 11:15 AM   Specimen: Anterior Nasal Swab; Sterile Swab  Result Value Ref Range Status   Group A Strep by PCR NOT DETECTED NOT DETECTED Final    Comment: Performed at [facility], [place] [address], [place], [place] [address]  Respiratory (~20 pathogens) panel by PCR     Status: None   Collection Time: 12/06/22  1:14 AM   Specimen: Nasopharyngeal Swab; Respiratory  Result Value Ref Range Status   Adenovirus NOT DETECTED NOT DETECTED Final   Coronavirus 229E NOT DETECTED NOT DETECTED Final    Comment: (NOTE) The Coronavirus on the Respiratory Panel, DOES NOT test for the novel  Coronavirus (2019 nCoV)    Coronavirus HKU1 NOT DETECTED NOT DETECTED Final   Coronavirus NL63 NOT DETECTED NOT DETECTED Final   Coronavirus OC43 NOT DETECTED NOT DETECTED Final   Metapneumovirus NOT DETECTED NOT DETECTED Final   Rhinovirus / Enterovirus NOT DETECTED NOT DETECTED Final   Influenza A NOT DETECTED NOT DETECTED Final   Influenza B NOT DETECTED NOT DETECTED Final   Parainfluenza Virus 1 NOT DETECTED NOT DETECTED Final   Parainfluenza Virus 2 NOT DETECTED NOT DETECTED Final   Parainfluenza Virus 3 NOT DETECTED NOT DETECTED Final   Parainfluenza Virus 4 NOT DETECTED NOT DETECTED Final   Respiratory Syncytial Virus NOT DETECTED NOT DETECTED Final   Bordetella pertussis NOT DETECTED NOT DETECTED Final   Bordetella Parapertussis NOT DETECTED NOT DETECTED Final   Chlamydophila pneumoniae NOT DETECTED NOT DETECTED Final   Mycoplasma pneumoniae NOT DETECTED NOT DETECTED Final    Comment: Performed at [facility], [place] [address], [place], [place] [address]  Culture, blood (Routine X 2) w Reflex to ID Panel     Status: None (Preliminary result)   Collection Time: 12/08/22  7:57 AM   Specimen: BLOOD RIGHT HAND  Result Value Ref Range Status   Specimen Description BLOOD RIGHT HAND  Final   Special Requests   Final    BOTTLES DRAWN AEROBIC AND ANAEROBIC Blood Culture  results may not be optimal due to an inadequate volume of blood received in culture bottles   Culture   Final    NO GROWTH < 24 HOURS Performed at [facility], [place] [address] [place], [place] [address]    Report Status PENDING  Incomplete  Culture, blood (Routine X 2) w Reflex to ID Panel     Status: None (Preliminary result)   Collection Time: 12/08/22  8:03 AM   Specimen: BLOOD LEFT HAND  Result Value Ref Range Status   Specimen Description BLOOD LEFT HAND  Final   Special Requests   Final    BOTTLES DRAWN AEROBIC ONLY Blood Culture results may not be optimal due to an inadequate volume  of blood received in culture bottles   Culture   Final    NO GROWTH < 24 HOURS Performed at [facility], [place] [address] [place], [place] [address]    Report Status PENDING  Incomplete     Time coordinating discharge: 35 minutes  SIGNED:   Barb Merino, MD  [facility] 12/09/2022, 12:26 PM

## 2022-12-12 ENCOUNTER — Ambulatory Visit: Payer: Medicaid Other | Attending: Cardiology | Admitting: Pharmacist

## 2022-12-12 DIAGNOSIS — Z954 Presence of other heart-valve replacement: Secondary | ICD-10-CM | POA: Diagnosis not present

## 2022-12-12 DIAGNOSIS — Z7901 Long term (current) use of anticoagulants: Secondary | ICD-10-CM

## 2022-12-12 LAB — POCT INR: INR: 1.5 — AB (ref 2.0–3.0)

## 2022-12-12 NOTE — Patient Instructions (Signed)
Description   Take 2 tablets today and tomorrow and continue your Lovenox injections then continue 1.5 tablets daily except for 1 tablet on Sunday and Thursday. Recheck INR in 1 week. Coumadin Clinic [phone]

## 2022-12-13 LAB — CULTURE, BLOOD (ROUTINE X 2)
Culture: NO GROWTH
Culture: NO GROWTH

## 2022-12-15 ENCOUNTER — Telehealth: Payer: Self-pay

## 2022-12-15 NOTE — Transitions of Care (Post Inpatient/ED Visit) (Signed)
   12/15/2022  Name: Mary [name] MRN: [id number] DOB: April 11, 1961  Today's TOC FU Call Status: Today's TOC FU Call Status:: Unsuccessul Call (1st Attempt) Unsuccessful Call (1st Attempt) Date: 12/15/22  Attempted to reach the patient regarding the most recent Inpatient/ED visit.  Follow Up Plan: Additional outreach attempts will be made to reach the patient to complete the Transitions of Care (Post Inpatient/ED visit) call.   Patient has an appointment at [facility] with Dr Wynetta Emery - 12/20/2022   Signature Eden Lathe, RN

## 2022-12-16 ENCOUNTER — Telehealth: Payer: Self-pay | Admitting: Emergency Medicine

## 2022-12-16 NOTE — Telephone Encounter (Signed)
Copied from [place] [id number]. Topic: General - Inquiry >> Dec 16, 2022 11:47 AM Marcellus Scott wrote: Reason for CRM: Pt stated she was advised to call and schedule Dr. Chipper Oman Drab at [facility] however, they do not accept her insurance, [place] [employer]. Pt stated she was advised to call her PCP and see if PCP would send a referral to someone who exepted her insurance.  Please advise.

## 2022-12-19 ENCOUNTER — Telehealth: Payer: Self-pay

## 2022-12-19 ENCOUNTER — Ambulatory Visit: Payer: Medicaid Other | Attending: Cardiovascular Disease

## 2022-12-19 ENCOUNTER — Encounter: Payer: Self-pay | Admitting: Internal Medicine

## 2022-12-19 ENCOUNTER — Ambulatory Visit: Payer: Medicaid Other | Attending: Internal Medicine | Admitting: Internal Medicine

## 2022-12-19 VITALS — BP 138/82 | HR 103 | Temp 98.1°F | Ht 62.0 in | Wt 150.0 lb

## 2022-12-19 DIAGNOSIS — Z5181 Encounter for therapeutic drug level monitoring: Secondary | ICD-10-CM | POA: Diagnosis not present

## 2022-12-19 DIAGNOSIS — Z7901 Long term (current) use of anticoagulants: Secondary | ICD-10-CM | POA: Diagnosis not present

## 2022-12-19 DIAGNOSIS — K029 Dental caries, unspecified: Secondary | ICD-10-CM

## 2022-12-19 DIAGNOSIS — Z09 Encounter for follow-up examination after completed treatment for conditions other than malignant neoplasm: Secondary | ICD-10-CM

## 2022-12-19 DIAGNOSIS — K068 Other specified disorders of gingiva and edentulous alveolar ridge: Secondary | ICD-10-CM

## 2022-12-19 LAB — POCT INR: INR: 3.9 — AB (ref 2.0–3.0)

## 2022-12-19 NOTE — Progress Notes (Signed)
Patient ID: Mary [name], female    DOB: 10/25/1960  MRN: [id number]  CC:  Hospitalization Follow-up [facility] refill. Med refill. Kinnie Feil referral to oral surgeon)   Subjective: Mary [name] is a 62 y.o. female who presents for hospital follow-up visit.   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  CT of the neck did not show any significant mandibular abnormalities.  Dental x-ray showed dental caries but no lucencies.  Patient treated with antibiotics for infected right molar tooth.  Seen by ENT.  They felt this was most likely related to dental pain.  INR was supratherapeutic.  This was held.  Discharged home on Lovenox and restarted on Coumadin.  Today: She has completed Augmentin antibiotics.  She is seeking referral to an oral surgeon.  Reports right jaw is still a little swollen but significantly decreased from what it was at the time of hospital admission.  Still has some pain in the jaw but not as severe as upon presentation to the hospital. Seen in Coumadin clinic today.  INR was 3.9.  Coumadin dose adjusted.  Lovenox discontinued. 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   Pharyngitis 12/06/2022   Neuropathy 12/06/2022   Tachycardia 12/05/2022   Supratherapeutic INR 12/05/2022   Hyperkalemia 12/05/2022   Hypotension due to hypovolemia 07/19/2022   Prolonged QT interval 07/19/2022   Marijuana user 12/24/2020   LFT elevation    Acquired pyloric stenosis    GI bleed [id number]   Alcoholic peripheral neuropathy ([place]) 09/01/2020   Thiamin deficiency  09/01/2020   Paresthesia    Vitamin B12 deficiency 05/04/2020   Vomiting 04/24/2020   GERD (gastroesophageal reflux disease)    Marijuana abuse    Macrocytic anemia 04/04/2019   Intermittent complete heart block (HCC)    Syncope 03/28/2019   Orthostatic hypotension 01/11/2017   Elevated liver enzymes 01/11/2017   Atypical chest pain 06/05/2016   Acute renal failure superimposed on stage 3a chronic kidney disease ([place]) 01/10/2016   Elevated INR 01/10/2016   TIA (transient ischemic attack) 09/13/2015   CVA (cerebral vascular accident) ([place]) 09/12/2015   SOB (shortness of breath) 02/24/2014   First degree heart block 06/17/2013   (HFpEF) heart failure with preserved ejection fraction ([place]) 06/16/2013   History of bacterial endocarditis 06/16/2013   Warfarin anticoagulation 06/10/2013   S/P mitral valve replacement with metallic valve [id number]   Femoral hernia 05/23/2013   Mitral valve insufficiency 04/22/2013   Tricuspid regurgitation 04/22/2013   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     folic acid (FOLVITE) 1 MG tablet Take 1 tablet by mouth once daily 30 tablet 0   gabapentin (NEURONTIN) 300 MG capsule TAKE 1 CAPSULE BY MOUTH THREE TIMES DAILY 90 capsule 3   latanoprost (XALATAN) 0.005 % ophthalmic solution Place 1 drop into both eyes at bedtime.     Naphazoline HCl (CLEAR EYES  OP) Place 2 drops into both eyes as needed (dry/irritated eyes).     pantoprazole (PROTONIX) 40 MG tablet Take 1 tablet by mouth twice daily (Patient taking differently: Take 40 mg by mouth daily.) 180 tablet 1   thiamine 100 MG tablet Take 1 tablet (100 mg total) by mouth daily. 100 tablet 0   warfarin (COUMADIN) 5 MG tablet Take 1 tablet (5  mg total) by mouth every Tuesday, Thursday, and Saturday at 6 PM.     warfarin (COUMADIN) 7.5 MG tablet Take 1 tablet (7.5 mg total) by mouth every Monday,Wednesday,Friday, and Sunday at 6 PM.     enoxaparin (LOVENOX) 80 MG/0.8ML injection Inject 0.7 mLs (70 mg total) into the skin every 12 (twelve) hours for 5 days. 8 mL 0   No current facility-administered medications on file prior to visit.    Allergies  Allergen Reactions   Ibuprofen Itching   Oxycodone Itching    Pt states no longer makes her itchy   Tape Itching, Rash and Other (See Comments)    THE ONLY TAPE THAT IS TOLERATED IS PAPER TAPE. EKG leads inflame the skin    Social History   Socioeconomic History   Marital status: Single    Spouse name: Not on file   Number of children: Not on file   Years of education: Not on file   Highest education level: Not on file  Occupational History   Not on file  Tobacco Use   Smoking status: Former    Packs/day: 0.50    Years: 36.00    Additional pack years: 0.00    Total pack years: 18.00    Types: Cigarettes    Quit date: 03/15/2013    Years since quitting: 9.7   Smokeless tobacco: Never  Vaping Use   Vaping Use: Never used  Substance and Sexual Activity   Alcohol use: Yes    Comment:  "glass of wine"   Drug use: No   Sexual activity: Never  Other Topics Concern   Not on file  Social History Narrative   Right handed   Two story home apartment    Social Determinants of Health   Financial Resource Strain: Not on file  Food Insecurity: No Food Insecurity (12/06/2022)   Hunger Vital Sign    Worried About Running Out of Food in the Last Year: Never true    Ran Out of Food in the Last Year: Never true  Transportation Needs: No Transportation Needs (12/09/2022)   PRAPARE - Hydrologist (Medical): No    Lack of Transportation (Non-Medical): No  Physical Activity: Not on file  Stress: Not on file  Social Connections: Not on file  Intimate  Partner Violence: Not At Risk (12/06/2022)   Humiliation, Afraid, Rape, and Kick questionnaire    Fear of Current or Ex-Partner: No    Emotionally Abused: No    Physically Abused: No    Sexually Abused: No    Family History  Problem Relation Age of Onset   Kidney disease Mother        Had one Kidney removed   Stroke Father    Cancer Father    Sarcoidosis Sister    Heart attack Neg Hx    Colon cancer Neg Hx    Stomach cancer Neg Hx    Pancreatic cancer Neg Hx    Esophageal cancer Neg Hx     Past Surgical History:  Procedure Laterality Date   APPENDECTOMY  ~1978  BALLOON DILATION N/A 11/27/2020   Procedure: BALLOON DILATION;  Surgeon: Gatha Mayer, MD;  Location: [facility] ENDOSCOPY;  Service: Endoscopy;  Laterality: N/A;   BIOPSY  05/01/2020   Procedure: BIOPSY;  Surgeon: Milus Banister, MD;  Location: [facility] ENDOSCOPY;  Service: Endoscopy;;   [place] WITH BILATERAL TUBAL LIGATION  1999   COLONOSCOPY WITH PROPOFOL N/A 05/01/2020   Procedure: COLONOSCOPY WITH PROPOFOL;  Surgeon: Milus Banister, MD;  Location: [facility] ENDOSCOPY;  Service: Endoscopy;  Laterality: N/A;   ESOPHAGOGASTRODUODENOSCOPY (EGD) WITH PROPOFOL N/A 05/01/2020   Procedure: ESOPHAGOGASTRODUODENOSCOPY (EGD) WITH PROPOFOL;  Surgeon: Milus Banister, MD;  Location: [facility] ENDOSCOPY;  Service: Endoscopy;  Laterality: N/A;   ESOPHAGOGASTRODUODENOSCOPY (EGD) WITH PROPOFOL N/A 11/27/2020   Procedure: ESOPHAGOGASTRODUODENOSCOPY (EGD) WITH PROPOFOL;  Surgeon: Gatha Mayer, MD;  Location: [place];  Service: Endoscopy;  Laterality: N/A;   FEMORAL HERNIA REPAIR Right 05/23/2013   Procedure: HERNIA REPAIR FEMORAL;  Surgeon: Rexene Alberts, MD;  Location: [place];  Service: Open Heart Surgery;  Laterality: Right;   INTRAOPERATIVE TRANSESOPHAGEAL ECHOCARDIOGRAM N/A 05/23/2013   Procedure: INTRAOPERATIVE TRANSESOPHAGEAL ECHOCARDIOGRAM;  Surgeon:  Rexene Alberts, MD;  Location: [place];  Service: Open Heart Surgery;  Laterality: N/A;   LAPAROSCOPIC CHOLECYSTECTOMY  2003   LEFT AND RIGHT HEART CATHETERIZATION WITH CORONARY ANGIOGRAM N/A 03/22/2013   Procedure: LEFT AND RIGHT HEART CATHETERIZATION WITH CORONARY ANGIOGRAM;  Surgeon: Burnell Blanks, MD;  Location: [facility] CATH LAB;  Service: Cardiovascular;  Laterality: N/A;   MINIMALLY INVASIVE TRICUSPID VALVE REPAIR Right 05/23/2013   Procedure: MINIMALLY INVASIVE TRICUSPID VALVE REPAIR;  Surgeon: Rexene Alberts, MD;  Location: [place];  Service: Open Heart Surgery;  Laterality: Right;   MITRAL VALVE REPLACEMENT N/A 05/23/2013   Procedure: MITRAL VALVE (MV) REPLACEMENT;  Surgeon: Rexene Alberts, MD;  Location: [place];  Service: Open Heart Surgery;  Laterality: N/A;   MULTIPLE EXTRACTIONS WITH ALVEOLOPLASTY N/A 04/04/2013   Procedure: Extraction of tooth #'s 1,8,9,13,14,15,23,24,25,26 with alveoloplasty and gross debridement of remaining teeth;  Surgeon: Lenn Cal, DDS;  Location: [place];  Service: Oral Surgery;  Laterality: N/A;   TEE WITHOUT CARDIOVERSION N/A 03/18/2013   Procedure: TRANSESOPHAGEAL ECHOCARDIOGRAM (TEE);  Surgeon: Larey Dresser, MD;  Location: [place];  Service: Cardiovascular;  Laterality: N/A;   TEE WITHOUT CARDIOVERSION N/A 06/17/2013   Procedure: TRANSESOPHAGEAL ECHOCARDIOGRAM (TEE);  Surgeon: Lelon Perla, MD;  Location: [facility] ENDOSCOPY;  Service: Cardiovascular;  Laterality: N/A;   TUBAL LIGATION  1999    ROS: Review of Systems Negative except as stated above  PHYSICAL EXAM: BP 138/82 (BP Location: Left Arm, Patient Position: Sitting, Cuff Size: Normal)   Pulse (!) 103   Temp 98.1 F (36.7 C) (Oral)   Ht 5\' 2"  (1.575 m)   Wt 150 lb (68 kg)   LMP 01/28/2013   SpO2 97%   BMI 27.44 kg/m   Physical Exam  General appearance -older African-American female in NAD Mental status - normal mood, behavior, speech, dress, motor activity, and thought  processes Mouth -slight fullness of the right jaw compared to the left.  Noted to have cavity of the second to the last molar in the right upper jaw.  No surrounding erythema or edema. Chest - clear to auscultation, no wheezes, rales or rhonchi, symmetric air entry Heart -regular rate and rhythm with clicking of mechanical valve.  Latest Ref Rng & Units 12/07/2022    1:10 AM 12/06/2022    5:15 AM 12/05/2022    7:35 PM  CMP  Glucose 70 - 99 mg/dL 105  109    BUN 6 - 20 mg/dL 11  11    Creatinine 0.44 - 1.00 mg/dL 1.28  1.32    Sodium 135 - 145 mmol/L 138  136    Potassium 3.5 - 5.1 mmol/L 4.5  4.4  5.4   Chloride 98 - 111 mmol/L 109  106    CO2 22 - 32 mmol/L 23  21    Calcium 8.9 - 10.3 mg/dL 8.5  8.7    Total Protein 6.5 - 8.1 g/dL 5.8     Total Bilirubin 0.3 - 1.2 mg/dL 0.2     Alkaline Phos 38 - 126 U/L 89     AST 15 - 41 U/L 23     ALT 0 - 44 U/L 14      Lipid Panel     Component Value Date/Time   CHOL 189 05/31/2018 1108   TRIG 300 (H) 05/31/2018 1108   HDL 68 05/31/2018 1108   CHOLHDL 2.8 05/31/2018 1108   CHOLHDL 3.2 06/06/2016 0135   VLDL 22 06/06/2016 0135   LDLCALC 61 05/31/2018 1108    CBC    Component Value Date/Time   WBC 5.3 12/09/2022 0621   RBC 2.89 (L) 12/09/2022 0621   HGB 10.0 (L) 12/09/2022 0621   HGB 9.6 (L) 12/24/2020 1201   HCT 30.3 (L) 12/09/2022 0621   HCT 29.0 (L) 12/24/2020 1201   PLT 342 12/09/2022 0621   PLT 361 12/24/2020 1201   MCV 104.8 (H) 12/09/2022 0621   MCV 108 (H) 12/24/2020 1201   MCH 34.6 (H) 12/09/2022 0621   MCHC 33.0 12/09/2022 0621   RDW 14.0 12/09/2022 0621   RDW 15.7 (H) 12/24/2020 1201   LYMPHSABS 1.3 12/07/2022 0110   LYMPHSABS 1.9 06/26/2020 1218   MONOABS 0.5 12/07/2022 0110   EOSABS 0.2 12/07/2022 0110   EOSABS 0.2 06/26/2020 1218   BASOSABS 0.0 12/07/2022 0110   BASOSABS 0.0 06/26/2020 1218    ASSESSMENT AND PLAN:  1. Hospital discharge follow-up   2. Pain due to dental caries Message sent to  our referral coordinator to try to get her in with an oral surgeon as soon as possible. - Ambulatory referral to Oral Maxillofacial Surgery    Patient was given the opportunity to ask questions.  Patient verbalized understanding of the plan and was able to repeat key elements of the plan.   This documentation was completed using Radio producer.  Any transcriptional errors are unintentional.  Orders Placed This Encounter  Procedures   Ambulatory referral to Oral Maxillofacial Surgery     Requested Prescriptions    No prescriptions requested or ordered in this encounter    No follow-ups on file.  Karle Plumber, MD, FACP

## 2022-12-19 NOTE — Patient Instructions (Addendum)
Description   Only take 1/2 tablet today and then continue 1.5 tablets daily except for 1 tablet on Sunday and Thursday.  Stay consistent with greens each week (1 per week)  Recheck INR in 1 week.  Coumadin Clinic [phone]

## 2022-12-19 NOTE — Telephone Encounter (Signed)
From the discharge call:   She is experiencing dental pain and is in need of a dental referral. I referred her to Managed Medicaid care management for additional assistance locating a dentist /oral surgeon in her network  She said she has all of her medications.  She was at [facility] for her INR check at the time of this call.  Follow up with Dr Wynetta Emery this afternoon, 12/19/2022

## 2022-12-19 NOTE — Transitions of Care (Post Inpatient/ED Visit) (Signed)
   12/19/2022  Name: Mary [name] MRN: [id number] DOB: [birth year]  Today's TOC FU Call Status: Today's TOC FU Call Status:: Successful TOC FU Call Competed Unsuccessful Call (1st Attempt) Date: 12/15/22 [facility] FU Call Complete Date: 12/19/22  Transition Care Management Follow-up Telephone Call Date of Discharge: 12/09/22 Discharge Facility: [facility]) Type of Discharge: Inpatient Admission Primary Inpatient Discharge Diagnosis:: tachycardia How have you been since you were released from the hospital?: Better Any questions or concerns?: Yes Patient Questions/Concerns:: She is experiencing dental pain and is in need of a dental referral. Patient Questions/Concerns Addressed: Other:, Notified Provider of Patient Questions/Concerns (referred to Managed Medicaid care management for assistance locating a dentist /oral surgeon in her network)  Items Reviewed: Did you receive and understand the discharge instructions provided?: Yes Medications obtained and verified?: Yes (Medications Reviewed) (she completed the course of lovenox.  She was at [facility] for her INR check at the time of this call.) Any new allergies since your discharge?: No Dietary orders reviewed?: No (She explained that she is in so much pain that it is difficult to eat) Do you have support at home?: Yes  Home Care and Equipment/Supplies: [place] Ordered?: No Any new equipment or medical supplies ordered?: No  Functional Questionnaire: Do you need assistance with bathing/showering or dressing?: No Do you need assistance with meal preparation?: Yes Do you need assistance with eating?: No Do you have difficulty maintaining continence: No Do you need assistance with getting out of bed/getting out of a chair/moving?: Yes (has a cane) Do you have difficulty managing or taking your medications?: Yes (She said she needs help at times.)  Follow up appointments reviewed: PCP Follow-up appointment  confirmed?: Yes Date of PCP follow-up appointment?: 12/19/22 Follow-up Provider: [facility] Follow-up appointment confirmed?: Yes Date of Specialist follow-up appointment?: 12/19/22 Follow-Up Specialty Provider:: Tiro check 3.18.2024 and 3.25.2024. Do you need transportation to your follow-up appointment?: No Do you understand care options if your condition(s) worsen?: Yes-patient verbalized understanding    SIGNATURE Eden Lathe, RN

## 2022-12-20 ENCOUNTER — Telehealth: Payer: Self-pay

## 2022-12-20 ENCOUNTER — Ambulatory Visit: Payer: Medicaid Other | Admitting: Internal Medicine

## 2022-12-20 NOTE — Telephone Encounter (Signed)
..   Medicaid Managed Care   Unsuccessful Outreach Note  12/20/2022 Name: Mary [name] MRN: [id number] DOB: 1961/08/16  Referred by: [name], MD Reason for referral : Appointment (I called the patient today to get her scheduled with the MM Team but she did not answer and there was not a way to leave a message.)   An unsuccessful telephone outreach was attempted today. The patient was referred to the case management team for assistance with care management and care coordination.   Follow Up Plan: The care management team will reach out to the patient again over the next 5 days.   [place]  [phone]

## 2022-12-21 ENCOUNTER — Telehealth: Payer: Self-pay | Admitting: Internal Medicine

## 2022-12-21 NOTE — Telephone Encounter (Signed)
-----   Message from Ena Dawley sent at 12/20/2022  2:39 PM EDT ----- Regarding: RE: ASAP Oral surgeon referral Good Afternoon Sent  Referral  today  3/26 Dr. Dorena Bodo MD [facility] [address], [place], [place] Ph# [place] [phone]  [phone] .Waiting for  respond     ----- Message ----- From: [name], MD Sent: 12/19/2022   4:23 PM EDT To: Ena Dawley Subject: ASAP Oral surgeon referral

## 2022-12-22 ENCOUNTER — Other Ambulatory Visit: Payer: Medicaid Other

## 2022-12-22 NOTE — Patient Outreach (Signed)
Medicaid Managed Care Social Work Note  12/22/2022 Name:  Mary [name] MRN:  [id number] DOB:  1960/12/07  Mary [name] is an 62 y.o. year old female who is a primary patient of Mary Pier, MD.  The [facility] team was consulted for assistance with:   Oral Surgeon   Mary [name] was given information about Medicaid Managed Care Coordination team services today. Mary [name] Patient agreed to services and verbal consent obtained.  Engaged with patient  for by telephone  forinitial visit in response to referral for case management and/or care coordination services.   Assessments/Interventions:  Review of past medical history, allergies, medications, health status, including review of consultants reports, laboratory and other test data, was performed as part of comprehensive evaluation and provision of chronic care management services.  SDOH: (Social Determinant of Health) assessments and interventions performed: SDOH Interventions    Flowsheet Row ED to Hosp-Admission (Discharged) from 12/05/2022 in [facility] 4E CV SURGICAL PROGRESSIVE CARE  SDOH Interventions   Transportation Interventions Taxi Voucher Given, Inpatient TOC     BSW completed a telephone outreach with patient, she stated she is needing an Chief Financial Officer that accepts her medicaid. BSW informed patient her PCP send a referral over to [facility] on 3/26. BSW did contact [facility] and spoke with Mary [name], she stated they are in network with [employer]. Patient stated she is also in need of resources for rent and utilities. Resources sent via mail.  Advanced Directives Status:  Not addressed in this encounter.  Care Plan                 Allergies  Allergen Reactions   Ibuprofen Itching   Oxycodone Itching    Pt states no longer makes her itchy   Tape Itching, Rash and Other (See Comments)    THE ONLY TAPE THAT IS TOLERATED IS PAPER TAPE. EKG leads inflame the skin     Medications Reviewed Today     Reviewed by Mary Pier, MD (Physician) on 12/19/22 at [facility] List Status: <None>   Medication Order Taking? Sig Documenting Provider Last Dose Status Informant  acetaminophen (TYLENOL) 500 MG tablet [id number] Yes Take 1,000 mg by mouth in the morning, at noon, and at bedtime. [provider] Taking Active Self, Pharmacy Records  enoxaparin (LOVENOX) 80 MG/0.8ML injection [id number]  Inject 0.7 mLs (70 mg total) into the skin every 12 (twelve) hours for 5 days. Mary Merino, MD  Expired [id number]   folic acid (FOLVITE) 1 MG tablet [id number] Yes Take 1 tablet by mouth once daily Mary Pier, MD Taking Active Pharmacy Records, Self  gabapentin (NEURONTIN) 300 MG capsule [id number] Yes TAKE 1 CAPSULE BY MOUTH THREE TIMES DAILY Mary Pier, MD Taking Active Pharmacy Records, Self  latanoprost (XALATAN) 0.005 % ophthalmic solution [id number] Yes Place 1 drop into both eyes at bedtime. [provider] Taking Active Pharmacy Records, Self  Naphazoline HCl (CLEAR EYES OP) [id number] Yes Place 2 drops into both eyes as needed (dry/irritated eyes). [provider] Taking Active Self, Pharmacy Records  pantoprazole (PROTONIX) 40 MG tablet [id number] Yes Take 1 tablet by mouth twice daily  Patient taking differently: Take 40 mg by mouth daily.   Mary Pier, MD Taking Active Self, Pharmacy Records  thiamine 100 MG tablet [id number] Yes Take 1 tablet (100 mg total) by mouth daily. Mary Pier, MD Taking Active Self, Pharmacy Records  warfarin (COUMADIN) 5 MG  tablet [id number] Yes Take 1 tablet (5 mg total) by mouth every Tuesday, Thursday, and Saturday at 6 PM. Mary Merino, MD Taking Active   warfarin (COUMADIN) 7.5 MG tablet [id number] Yes Take 1 tablet (7.5 mg total) by mouth every Monday,Wednesday,Friday, and Sunday at 6 PM. 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   Pharyngitis 12/06/2022   Neuropathy 12/06/2022   Tachycardia 12/05/2022   Supratherapeutic INR 12/05/2022   Hyperkalemia 12/05/2022   Hypotension due to hypovolemia 07/19/2022   Prolonged QT interval 07/19/2022   Marijuana user 12/24/2020   LFT elevation    Acquired pyloric stenosis    GI bleed [id number]   Alcoholic peripheral neuropathy ([place]) 09/01/2020   Thiamin deficiency 09/01/2020   Paresthesia    Vitamin B12 deficiency 05/04/2020   Vomiting 04/24/2020   GERD (gastroesophageal reflux disease)    Marijuana abuse    Macrocytic anemia 04/04/2019   Intermittent complete heart block ([place])    Syncope 03/28/2019   Orthostatic hypotension 01/11/2017   Elevated liver enzymes 01/11/2017   Atypical chest pain 06/05/2016   Acute renal failure superimposed on stage 3a chronic kidney disease ([place]) 01/10/2016   Elevated INR 01/10/2016   TIA (transient ischemic attack) 09/13/2015   CVA (cerebral vascular accident) ([place]) 09/12/2015   SOB (shortness of breath) 02/24/2014   First degree heart block 06/17/2013   (HFpEF) heart failure with preserved ejection fraction ([place]) 06/16/2013   History of bacterial endocarditis 06/16/2013   Warfarin anticoagulation 06/10/2013   S/P mitral valve replacement with metallic valve [id number]   Femoral hernia 05/23/2013   Mitral valve insufficiency 04/22/2013   Tricuspid regurgitation 04/22/2013   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   SVT (supraventricular tachycardia) ([place]) 03/16/2013    Conditions to be addressed/monitored per PCP order:   Oral surgery and community resources  There are no care plans that you recently modified to display for this patient.   Follow up:  Patient agrees to Care Plan and Follow-up.  Plan: The Managed Medicaid care management team will reach out to the patient again over the next 30 days.  Date/time of next scheduled Social Work care  management/care coordination outreach:  01/23/23  Mary [name], Mary [name], Mary [name] Medicaid Team  [phone]

## 2022-12-22 NOTE — Patient Instructions (Signed)
Visit Information  Ms. Beissel was given information about Medicaid Managed Care team care coordination services as a part of their [place] Medicaid benefit. Little AYUSHA FIGUERAS verbally consentedto engagement with the [facility] Managed Care team.   If you are experiencing a medical emergency, please call 911 or report to your local emergency department or urgent care.   If you have a non-emergency medical problem during routine business hours, please contact your provider's office and ask to speak with a nurse.   For questions related to your [facility] health plan, please call: [phone]  OR visit the member homepage at: [email or web address]  If you would like to schedule transportation through your [facility] plan, please call the following number at least 2 days in advance of your appointment: [phone]  If you are experiencing a behavioral health crisis, call the [place] at [phone]). The line is available 24 hours a day, seven days a week.  If you would like help to quit smoking, call 1-800-QUIT-NOW 603-702-2522) OR Espaol: 1-855-Djelo-Ya HD:1601594) o para ms informacin haga clic aqu or Text READY to 200-400 to register via text  Ms. Mallie Mussel - following are the goals we discussed in your visit today:   Goals Addressed   None       Social Worker will follow up in 30 day.   [name], BSW, [place] Managed Medicaid Team  [phone]   Following is a copy of your plan of care:  There are no care plans that you recently modified to display for this patient.

## 2022-12-26 ENCOUNTER — Telehealth: Payer: Self-pay | Admitting: *Deleted

## 2022-12-26 ENCOUNTER — Ambulatory Visit: Payer: Medicaid Other

## 2022-12-26 NOTE — Telephone Encounter (Signed)
Called pt since she missed her Anticoagulation Appt; there was no answer therefore, left a message for there to call back.

## 2022-12-27 ENCOUNTER — Telehealth: Payer: Self-pay | Admitting: Cardiology

## 2022-12-27 DIAGNOSIS — Z954 Presence of other heart-valve replacement: Secondary | ICD-10-CM

## 2022-12-27 MED ORDER — WARFARIN SODIUM 5 MG PO TABS: ORAL_TABLET | ORAL | 0 refills | Status: DC

## 2022-12-27 NOTE — Telephone Encounter (Signed)
Prescription refill request received for warfarin Lov: 06/22/21 [place] Por[place])  Next INR check: 12/26/22  - overdue (pt has scheduled appt on 12/29/22  Warfarin tablet strength: 5mg   Pt overdue for office visit - note placed on upcoming Anticoagulation Clinic appt to take pt to scheduling after appt to schedule appt with provider.   Called pt and made her aware she must adhere to upcoming Coumadin Clinic appt for future refills. Pt verbalized understanding.

## 2022-12-27 NOTE — Telephone Encounter (Signed)
 *  STAT* If patient is at the pharmacy, call can be transferred to refill team.   1. Which medications need to be refilled? (please list name of each medication and dose if known) warfarin (COUMADIN) 5 MG tablet   2. Which pharmacy/location (including street and city if local pharmacy) is medication to be sent to? [facility] ([place]), [place] - [address]    3. Do they need a 30 day or 90 day supply? 30 days  Pt said, she is completely out of meds need refill today

## 2022-12-28 ENCOUNTER — Telehealth: Payer: Self-pay | Admitting: Internal Medicine

## 2022-12-28 NOTE — Telephone Encounter (Signed)
Called & spoke to the patient. Verified name & DOB. Advised patient that the referral process may take up to two weeks. Informed patient of the name of the specialist office and phone number to contact for further questions. Instructed patient to call back if there are any complications. Patient expressed verbal understanding. No further questions at this time.

## 2022-12-28 NOTE — Telephone Encounter (Signed)
Copied from [place] [phone]. Topic: Referral - Status >> Dec 28, 2022 12:04 PM Santiya F wrote: Reason for CRM: Pt is calling in requesting an update on the status of a referral for an Chief Financial Officer. Pt says Dr. Wynetta Emery was supposed to refer her to an oral surgeon but she hasn't heard anything regarding the referral. Please advise.

## 2022-12-28 NOTE — Telephone Encounter (Signed)
Received a call from Dr.Stroud's Office and was informed that patient will need a referral to an on call [facility] Oral Surgeon. Dr.Stroud is now retired and their office no longer is on call for [facility].

## 2022-12-29 ENCOUNTER — Ambulatory Visit: Payer: Medicaid Other | Attending: Cardiology

## 2022-12-29 DIAGNOSIS — Z7901 Long term (current) use of anticoagulants: Secondary | ICD-10-CM | POA: Diagnosis not present

## 2022-12-29 LAB — POCT INR: INR: 2.5 (ref 2.0–3.0)

## 2022-12-29 NOTE — Patient Instructions (Signed)
Description   Take 1.5 tablets today and then continue 1.5 tablets daily except for 1 tablet on Sunday and Thursday.  Stay consistent with Esnures each week (1 per week)  Recheck INR in 3 weeks.  Coumadin Clinic [phone]

## 2023-01-06 ENCOUNTER — Other Ambulatory Visit: Payer: Self-pay | Admitting: Internal Medicine

## 2023-01-06 DIAGNOSIS — G629 Polyneuropathy, unspecified: Secondary | ICD-10-CM

## 2023-01-14 ENCOUNTER — Other Ambulatory Visit: Payer: Self-pay | Admitting: Internal Medicine

## 2023-01-14 DIAGNOSIS — K920 Hematemesis: Secondary | ICD-10-CM

## 2023-01-16 NOTE — Telephone Encounter (Signed)
Requested Prescriptions  Pending Prescriptions Disp Refills   pantoprazole (PROTONIX) 40 MG tablet [Pharmacy Med Name: Pantoprazole Sodium 40 MG Oral Tablet Delayed Release] 90 tablet 0    Sig: Take 1 tablet (40 mg total) by mouth daily.     Gastroenterology: Proton Pump Inhibitors Passed - 01/14/2023 10:30 AM      Passed - Valid encounter within last 12 months    Recent Outpatient Visits           4 weeks ago Hospital discharge follow-up   [facility] Marcine Matar, MD   6 months ago Overweight   [facility] Hoy Register, MD   1 year ago Vitamin B12 deficiency   [facility] [place], [place], [place]   1 year ago Vitamin B12 deficiency   [facility] Marcine Matar, MD   2 years ago Hematemesis of unknown cause   [facility] Marcine Matar, MD       Future Appointments             In 1 month Laural Benes, Binnie Rail, MD [facility]

## 2023-01-19 ENCOUNTER — Telehealth: Payer: Self-pay | Admitting: *Deleted

## 2023-01-19 ENCOUNTER — Ambulatory Visit: Payer: Medicaid Other

## 2023-01-19 ENCOUNTER — Telehealth: Payer: Self-pay

## 2023-01-19 NOTE — Patient Instructions (Signed)
Visit Information  [name] was given information about Medicaid Managed Care team care coordination services as a part of their [employer] [place] Medicaid benefit. Mary [name] verbally consentedto engagement with the [facility] Managed Care team.   If you are experiencing a medical emergency, please call 911 or report to your local emergency department or urgent care.   If you have a non-emergency medical problem during routine business hours, please contact your provider's office and ask to speak with a nurse.   For questions related to your [facility] health plan, please call: [phone]  OR visit the member homepage at: [email or web address]  If you would like to schedule transportation through your [facility] plan, please call the following number at least 2 days in advance of your appointment: [phone]  If you are experiencing a behavioral health crisis, call the [facility] Crisis Line at [phone]). The line is available 24 hours a day, seven days a week.  If you would like help to quit smoking, call 1-800-QUIT-NOW (563-589-6688) OR Espaol: 1-855-Djelo-Ya (6-644-034-7425) o para ms informacin haga clic aqu or Text READY to 956-387 to register via text  Ms. Mary [name] - following are the goals we discussed in your visit today:   Goals Addressed   None       The  Patient                                              has been provided with contact information for the Managed Medicaid care management team and has been advised to call with any health related questions or concerns.   Gus Puma, Kenard Gower, MHA [facility]  Managed Medicaid Social Worker [phone]   Following is a copy of your plan of care:  There are no care plans that you recently modified to display for this patient.

## 2023-01-19 NOTE — Telephone Encounter (Signed)
Pt missed INR appt today. Called pt and LMOM to call back and schedule an appt.

## 2023-01-19 NOTE — Patient Outreach (Signed)
Medicaid Managed Care Social Work Note  01/19/2023 Name:  Mary [name] MRN:  [id number] DOB:  05/31/1961  Mary [name] is an 62 y.o. year old female who is a primary patient of Marcine Matar, MD.  The [facility] Managed Care Coordination team was consulted for assistance with:   oral surgery  [name] was given information about Medicaid Managed Care Coordination team services today. Domenica Fail Patient agreed to services and verbal consent obtained.  Engaged with patient  for by telephone forfollow up visit in response to referral for case management and/or care coordination services.   Assessments/Interventions:  Review of past medical history, allergies, medications, health status, including review of consultants reports, laboratory and other test data, was performed as part of comprehensive evaluation and provision of chronic care management services.  SDOH: (Social Determinant of Health) assessments and interventions performed: SDOH Interventions    Flowsheet Row ED to Hosp-Admission (Discharged) from 12/05/2022 in [facility] 4E CV SURGICAL PROGRESSIVE CARE  SDOH Interventions   Transportation Interventions Taxi Voucher Given, Inpatient TOC     BSW completed a telephone outreach with patient, she states she did her from the oral surgeons and her appointment is on 01/24/23. Patient staetes everything is okay and does not need any other .  Advanced Directives Status:  Not addressed in this encounter.  Care Plan                 Allergies  Allergen Reactions   Ibuprofen Itching   Oxycodone Itching    Pt states no longer makes her itchy   Tape Itching, Rash and Other (See Comments)    THE ONLY TAPE THAT IS TOLERATED IS PAPER TAPE. EKG leads inflame the skin    Medications Reviewed Today     Reviewed by Marcine Matar, MD (Physician) on 12/19/22 at 1817  Med List Status: <None>   Medication Order Taking? Sig Documenting Provider Last Dose Status Informant   acetaminophen (TYLENOL) 500 MG tablet [id number] Yes Take 1,000 mg by mouth in the morning, at noon, and at bedtime. [provider] Taking Active Self, Pharmacy Records  enoxaparin (LOVENOX) 80 MG/0.8ML injection [id number]  Inject 0.7 mLs (70 mg total) into the skin every 12 (twelve) hours for 5 days. Dorcas Carrow, MD  Expired 12/15/22 2359   folic acid (FOLVITE) 1 MG tablet [id number] Yes Take 1 tablet by mouth once daily Marcine Matar, MD Taking Active Pharmacy Records, Self  gabapentin (NEURONTIN) 300 MG capsule [id number] Yes TAKE 1 CAPSULE BY MOUTH THREE TIMES DAILY Marcine Matar, MD Taking Active Pharmacy Records, Self  latanoprost (XALATAN) 0.005 % ophthalmic solution [id number] Yes Place 1 drop into both eyes at bedtime. [provider] Taking Active Pharmacy Records, Self  Naphazoline HCl (CLEAR EYES OP) [id number] Yes Place 2 drops into both eyes as needed (dry/irritated eyes). [provider] Taking Active Self, Pharmacy Records  pantoprazole (PROTONIX) 40 MG tablet [id number] Yes Take 1 tablet by mouth twice daily  Patient taking differently: Take 40 mg by mouth daily.   Marcine Matar, MD Taking Active Self, Pharmacy Records  thiamine 100 MG tablet [id number] Yes Take 1 tablet (100 mg total) by mouth daily. Marcine Matar, MD Taking Active Self, Pharmacy Records  warfarin (COUMADIN) 5 MG tablet [id number] Yes Take 1 tablet (5 mg total) by mouth every Tuesday, Thursday, and Saturday at 6 PM. Dorcas Carrow, MD Taking Active   warfarin (COUMADIN) 7.5 MG tablet [id number] Yes Take 1 tablet (  7.5 mg total) by mouth every Monday,Wednesday,Friday, and Sunday at 6 PM. 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   Pharyngitis 12/06/2022   Neuropathy 12/06/2022   Tachycardia 12/05/2022   Supratherapeutic INR 12/05/2022   Hyperkalemia 12/05/2022   Hypotension due to  hypovolemia 07/19/2022   Prolonged QT interval 07/19/2022   Marijuana user 12/24/2020   LFT elevation    Acquired pyloric stenosis    GI bleed 11/24/2020   Alcoholic peripheral neuropathy 09/01/2020   Thiamin deficiency 09/01/2020   Paresthesia    Vitamin B12 deficiency 05/04/2020   Vomiting 04/24/2020   GERD (gastroesophageal reflux disease)    Marijuana abuse    Macrocytic anemia 04/04/2019   Intermittent complete heart block    Syncope 03/28/2019   Orthostatic hypotension 01/11/2017   Elevated liver enzymes 01/11/2017   Atypical chest pain 06/05/2016   Acute renal failure superimposed on stage 3a chronic kidney disease 01/10/2016   Elevated INR 01/10/2016   TIA (transient ischemic attack) 09/13/2015   CVA (cerebral vascular accident) 09/12/2015   SOB (shortness of breath) 02/24/2014   First degree heart block 06/17/2013   (HFpEF) heart failure with preserved ejection fraction 06/16/2013   History of bacterial endocarditis 06/16/2013   Warfarin anticoagulation 06/10/2013   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   Femoral hernia 05/23/2013   Mitral valve insufficiency 04/22/2013   Tricuspid regurgitation 04/22/2013   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   SVT (supraventricular tachycardia) ([facility]) 03/16/2013    Conditions to be addressed/monitored per PCP order:   oral   There are no care plans that you recently modified to display for this patient.   Follow up:  Patient agrees to Care Plan and Follow-up.  Plan: The  Patient has been provided with contact information for the Managed Medicaid care management team and has been advised to call with any health related questions or concerns.    Abelino Derrick, MHA [facility]  Managed [facility] Social Worker [phone]

## 2023-01-23 ENCOUNTER — Ambulatory Visit: Payer: Self-pay | Admitting: *Deleted

## 2023-01-23 ENCOUNTER — Ambulatory Visit: Payer: Medicaid Other | Attending: Cardiology | Admitting: Pharmacist

## 2023-01-23 ENCOUNTER — Emergency Department (HOSPITAL_COMMUNITY): Payer: Medicaid Other

## 2023-01-23 ENCOUNTER — Encounter: Payer: Self-pay | Admitting: *Deleted

## 2023-01-23 ENCOUNTER — Observation Stay (HOSPITAL_COMMUNITY)
Admission: EM | Admit: 2023-01-23 | Discharge: 2023-01-25 | Disposition: A | Discharge status: Home / Self Care | Payer: Medicaid Other | Attending: Internal Medicine | Admitting: Internal Medicine

## 2023-01-23 ENCOUNTER — Encounter (HOSPITAL_COMMUNITY): Payer: Self-pay

## 2023-01-23 DIAGNOSIS — R791 Abnormal coagulation profile: Secondary | ICD-10-CM | POA: Diagnosis not present

## 2023-01-23 DIAGNOSIS — G629 Polyneuropathy, unspecified: Secondary | ICD-10-CM

## 2023-01-23 DIAGNOSIS — G459 Transient cerebral ischemic attack, unspecified: Secondary | ICD-10-CM

## 2023-01-23 DIAGNOSIS — Z8673 Personal history of transient ischemic attack (TIA), and cerebral infarction without residual deficits: Secondary | ICD-10-CM | POA: Insufficient documentation

## 2023-01-23 DIAGNOSIS — R42 Dizziness and giddiness: Secondary | ICD-10-CM | POA: Diagnosis not present

## 2023-01-23 DIAGNOSIS — I5032 Chronic diastolic (congestive) heart failure: Secondary | ICD-10-CM | POA: Diagnosis not present

## 2023-01-23 DIAGNOSIS — Z79899 Other long term (current) drug therapy: Secondary | ICD-10-CM | POA: Insufficient documentation

## 2023-01-23 DIAGNOSIS — R110 Nausea: Secondary | ICD-10-CM | POA: Diagnosis not present

## 2023-01-23 DIAGNOSIS — H409 Unspecified glaucoma: Secondary | ICD-10-CM | POA: Diagnosis not present

## 2023-01-23 DIAGNOSIS — I110 Hypertensive heart disease with heart failure: Secondary | ICD-10-CM | POA: Diagnosis not present

## 2023-01-23 DIAGNOSIS — I052 Rheumatic mitral stenosis with insufficiency: Secondary | ICD-10-CM

## 2023-01-23 DIAGNOSIS — Z87891 Personal history of nicotine dependence: Secondary | ICD-10-CM | POA: Diagnosis not present

## 2023-01-23 DIAGNOSIS — Z7901 Long term (current) use of anticoagulants: Secondary | ICD-10-CM

## 2023-01-23 DIAGNOSIS — R002 Palpitations: Secondary | ICD-10-CM | POA: Diagnosis not present

## 2023-01-23 DIAGNOSIS — K047 Periapical abscess without sinus: Secondary | ICD-10-CM | POA: Diagnosis not present

## 2023-01-23 DIAGNOSIS — I5030 Unspecified diastolic (congestive) heart failure: Secondary | ICD-10-CM | POA: Diagnosis present

## 2023-01-23 DIAGNOSIS — J45909 Unspecified asthma, uncomplicated: Secondary | ICD-10-CM | POA: Insufficient documentation

## 2023-01-23 DIAGNOSIS — I10 Essential (primary) hypertension: Secondary | ICD-10-CM | POA: Diagnosis not present

## 2023-01-23 DIAGNOSIS — Z954 Presence of other heart-valve replacement: Secondary | ICD-10-CM

## 2023-01-23 DIAGNOSIS — R000 Tachycardia, unspecified: Secondary | ICD-10-CM | POA: Diagnosis not present

## 2023-01-23 DIAGNOSIS — K029 Dental caries, unspecified: Secondary | ICD-10-CM | POA: Diagnosis not present

## 2023-01-23 LAB — POCT INR: INR: 6.7 — AB (ref 2.0–3.0)

## 2023-01-23 MED ORDER — TRAMADOL HCL 50 MG PO TABS: 50.0000 mg | ORAL_TABLET | Freq: Every evening | ORAL | 0 refills | Status: DC | PRN

## 2023-01-23 NOTE — Telephone Encounter (Signed)
Tramadol prescription sent to the pharmacy.

## 2023-01-23 NOTE — Patient Instructions (Addendum)
Description   Hold warfarin today, tomorrow, Wednesday and Thursday. Then restart normal schedule. which is 1.5 tablets daily except for 1 tablet on Sunday and Thursday.  Stay consistent with Ensures each week (1 per week)  Recheck INR in 1weeks.  Coumadin Clinic [phone] Call your dentist and let them know your INR was 6.7 today

## 2023-01-23 NOTE — Addendum Note (Signed)
Addended by: Hoy Register on: 01/23/2023 12:48 PM   Modules accepted: Orders

## 2023-01-23 NOTE — Telephone Encounter (Signed)
Message from [place] sent at 01/23/2023  8:51 AM EDT  Summary: oral discomfort / rx req   The patient has called to request a medication to help lower their INR which is currently 6.7  The patient has had significant oral discomfort in their upper and lower right side  The patient has been unable to eat and been scheduled to have surgery for tomorrow 01/24/23 which has been delayed  The patient would like to be prescribed something to help lower their INR  Please contact the patient further when possible          Call History   Type Contact Phone/Fax User  01/23/2023 08:49 AM EDT Phone (Incoming) Evora, Schechter (Self) [phone] Judie Petit) Coley, Everette A   Reason for Disposition  [1] Caller has URGENT medicine question about med that PCP or specialist prescribed AND [2] triager unable to answer question    Requesting pain medication for oral pain until has surgery Feb 07, 2023.   Postponed from 01/24/2023 due to INR 6.7  Answer Assessment - Initial Assessment Questions 1. NAME of MEDICINE: "What medicine(s) are you calling about?"     Pain medicine for my mouth pain.   I was for oral surgery 01/24/2023 but they had to postpone it because my INR today is 6.7.    It's been rescheduled for Feb 07, 2023.   They will recheck INR Jan 31, 2023.    Hopefully I can have the oral surgery.   2. QUESTION: "What is your question?" (e.g., double dose of medicine, side effect)     I need pain medicine for my mouth until I can have the surgery.   They postponed my oral surgery due to my INR being 6.7 today.   I've not been able to eat anything due to the pain.   They told me this morning I'm dehydrated.   I've not been able to eat or drink due to the pain in my mouth.   I'm ready to have this oral surgery done.  3. PRESCRIBER: "Who prescribed the medicine?" Reason: if prescribed by specialist, call should be referred to that group.     Can Dr. Laural Benes call in something for pain to help me until  I have this surgery?   My mouth hurts so bad. 4. SYMPTOMS: "Do you have any symptoms?" If Yes, ask: "What symptoms are you having?"  "How bad are the symptoms (e.g., mild, moderate, severe)     Pain in right upper and lower mouth.   She didn't know what kind of surgery she was having other than "oral surgery". 5. PREGNANCY:  "Is there any chance that you are pregnant?" "When was your last menstrual period?"     N/A due to age  Protocols used: Medication Question Call-A-AH

## 2023-01-23 NOTE — ED Triage Notes (Signed)
Pt presents w/ elevated INR lvls from cardiology appointment today 6.7. Pt began experiencing palpitations, nausea and dizziness that started today. Pt has not fallen but is unsteady from prior stroke, residuals unsteady gait. PMH of stroke, mitral valve replacement and one replaced.

## 2023-01-23 NOTE — Progress Notes (Signed)
Sent patient to lab for STAT INR. Lab called back and patient is too dehydrated.  Unable to get any blood.

## 2023-01-23 NOTE — Telephone Encounter (Signed)
  Chief Complaint: Requesting pain medication for oral pain until has oral surgery on Feb 07, 2023. Symptoms: Her INR is 6.7 this morning so oral surgery for 01/24/2023 was postponed.    She is requesting something for the pain until she has the surgery. Frequency:  Unable to eat or drink due to the pain in her mouth. Pertinent Negatives: Patient denies N/A Disposition: [] ED /[] Urgent Care (no appt availability in office) / [] Appointment(In office/virtual)/ []  [place] [facility]/ [] Home Care/ [] Refused Recommended Disposition /[] [facility]/ [x]  Follow-up with PCP Additional Notes: Message sent to Dr. Jonah Blue requesting she call in something for pain to [facility] on [address]

## 2023-01-23 NOTE — Telephone Encounter (Signed)
Spoke with patient . Verified name & DOB     Patient voiced that oral surgery was postpone until May 14th. Patient advised to contact oral surgeon office and discuss with them her  oral S/S.  Patient was agreeable.

## 2023-01-23 NOTE — Telephone Encounter (Signed)
This encounter was created in error - please disregard.

## 2023-01-23 NOTE — Telephone Encounter (Signed)
Patient calling back to request pain medication for mouth pain. Reports last time she was given tramadol and antibiotics . Taking tylenol ES but not helping with pain. See previous triage. Patient reports she can not eat or drink due to pain. Recommended mobile bus and reports she has no transportation. Recommended ED and to call 911 if sx worsen. Please advise.

## 2023-01-23 NOTE — Telephone Encounter (Signed)
Spoke with patient . Verified name & DOB    Advised patient that Tramadol was sent to [id number] - [place] ([place]), [place] - [address]

## 2023-01-24 ENCOUNTER — Emergency Department (HOSPITAL_COMMUNITY): Payer: Medicaid Other

## 2023-01-24 ENCOUNTER — Encounter (HOSPITAL_COMMUNITY): Payer: Self-pay | Admitting: Internal Medicine

## 2023-01-24 ENCOUNTER — Other Ambulatory Visit: Payer: Self-pay

## 2023-01-24 DIAGNOSIS — H409 Unspecified glaucoma: Secondary | ICD-10-CM | POA: Diagnosis present

## 2023-01-24 DIAGNOSIS — R791 Abnormal coagulation profile: Secondary | ICD-10-CM

## 2023-01-24 DIAGNOSIS — K029 Dental caries, unspecified: Secondary | ICD-10-CM | POA: Diagnosis not present

## 2023-01-24 LAB — COMPREHENSIVE METABOLIC PANEL
ALT: 10 U/L (ref 0–44)
AST: 20 U/L (ref 15–41)
Albumin: 3.1 g/dL — ABNORMAL LOW (ref 3.5–5.0)
Alkaline Phosphatase: 83 U/L (ref 38–126)
Anion gap: 6 (ref 5–15)
BUN: 12 mg/dL (ref 6–20)
CO2: 21 mmol/L — ABNORMAL LOW (ref 22–32)
Calcium: 8.2 mg/dL — ABNORMAL LOW (ref 8.9–10.3)
Chloride: 111 mmol/L (ref 98–111)
Creatinine, Ser: 1.15 mg/dL — ABNORMAL HIGH (ref 0.44–1.00)
GFR, Estimated: 55 mL/min — ABNORMAL LOW (ref >60)
Glucose, Bld: 110 mg/dL — ABNORMAL HIGH (ref 70–99)
Potassium: 4.3 mmol/L (ref 3.5–5.1)
Sodium: 138 mmol/L (ref 135–145)
Total Bilirubin: 0.5 mg/dL (ref 0.3–1.2)
Total Protein: 6.4 g/dL — ABNORMAL LOW (ref 6.5–8.1)

## 2023-01-24 LAB — CBC WITH DIFFERENTIAL/PLATELET
Abs Immature Granulocytes: 0.03 10*3/uL (ref 0.00–0.07)
Basophils Absolute: 0 10*3/uL (ref 0.0–0.1)
Basophils Relative: 0 %
Eosinophils Absolute: 0.2 10*3/uL (ref 0.0–0.5)
Eosinophils Relative: 2 %
HCT: 33.1 % — ABNORMAL LOW (ref 36.0–46.0)
Hemoglobin: 10.4 g/dL — ABNORMAL LOW (ref 12.0–15.0)
Immature Granulocytes: 0 %
Lymphocytes Relative: 12 %
Lymphs Abs: 1.3 10*3/uL (ref 0.7–4.0)
MCH: 33.1 pg (ref 26.0–34.0)
MCHC: 31.4 g/dL (ref 30.0–36.0)
MCV: 105.4 fL — ABNORMAL HIGH (ref 80.0–100.0)
Monocytes Absolute: 0.6 10*3/uL (ref 0.1–1.0)
Monocytes Relative: 6 %
Neutro Abs: 9.2 10*3/uL — ABNORMAL HIGH (ref 1.7–7.7)
Neutrophils Relative %: 80 %
Platelets: 262 10*3/uL (ref 150–400)
RBC: 3.14 MIL/uL — ABNORMAL LOW (ref 3.87–5.11)
RDW: 13.5 % (ref 11.5–15.5)
WBC: 11.5 10*3/uL — ABNORMAL HIGH (ref 4.0–10.5)
nRBC: 0 % (ref 0.0–0.2)

## 2023-01-24 LAB — PROTIME-INR
INR: 4.7 (ref 0.8–1.2)
INR: 4.2 (ref 0.8–1.2)
Prothrombin Time: 43.6 seconds — ABNORMAL HIGH (ref 11.4–15.2)
Prothrombin Time: 40.5 seconds — ABNORMAL HIGH (ref 11.4–15.2)

## 2023-01-24 LAB — CBC
HCT: 37.6 % (ref 36.0–46.0)
Hemoglobin: 12.1 g/dL (ref 12.0–15.0)
MCH: 33.6 pg (ref 26.0–34.0)
MCHC: 32.2 g/dL (ref 30.0–36.0)
MCV: 104.4 fL — ABNORMAL HIGH (ref 80.0–100.0)
Platelets: 324 10*3/uL (ref 150–400)
RBC: 3.6 MIL/uL — ABNORMAL LOW (ref 3.87–5.11)
RDW: 13.3 % (ref 11.5–15.5)
WBC: 12.1 10*3/uL — ABNORMAL HIGH (ref 4.0–10.5)
nRBC: 0 % (ref 0.0–0.2)

## 2023-01-24 LAB — BRAIN NATRIURETIC PEPTIDE: B Natriuretic Peptide: 168 pg/mL — ABNORMAL HIGH (ref 0.0–100.0)

## 2023-01-24 LAB — BASIC METABOLIC PANEL
Anion gap: 11 (ref 5–15)
BUN: 17 mg/dL (ref 6–20)
CO2: 20 mmol/L — ABNORMAL LOW (ref 22–32)
Calcium: 9.2 mg/dL (ref 8.9–10.3)
Chloride: 105 mmol/L (ref 98–111)
Creatinine, Ser: 1.17 mg/dL — ABNORMAL HIGH (ref 0.44–1.00)
GFR, Estimated: 53 mL/min — ABNORMAL LOW (ref >60)
Glucose, Bld: 104 mg/dL — ABNORMAL HIGH (ref 70–99)
Potassium: 4.2 mmol/L (ref 3.5–5.1)
Sodium: 136 mmol/L (ref 135–145)

## 2023-01-24 LAB — HIV ANTIBODY (ROUTINE TESTING W REFLEX): HIV Screen 4th Generation wRfx: NONREACTIVE

## 2023-01-24 LAB — TROPONIN I (HIGH SENSITIVITY)
Troponin I (High Sensitivity): 11 ng/L (ref <18)
Troponin I (High Sensitivity): 11 ng/L (ref <18)

## 2023-01-24 MED ORDER — SODIUM CHLORIDE 0.9 % IV BOLUS
500.0000 mL | Freq: Once | INTRAVENOUS | Status: AC
  Administered 2023-01-24: 500 mL via INTRAVENOUS

## 2023-01-24 MED ORDER — IOHEXOL 350 MG/ML SOLN
75.0000 mL | Freq: Once | INTRAVENOUS | Status: AC | PRN
  Administered 2023-01-24: 75 mL via INTRAVENOUS

## 2023-01-24 MED ORDER — HYDROCODONE-ACETAMINOPHEN 5-325 MG PO TABS: 1.0000 | ORAL_TABLET | Freq: Four times a day (QID) | ORAL | Status: DC | PRN

## 2023-01-24 MED ORDER — GABAPENTIN 300 MG PO CAPS
300.0000 mg | ORAL_CAPSULE | Freq: Three times a day (TID) | ORAL | Status: DC
  Administered 2023-01-24 – 2023-01-25 (×4): 300 mg via ORAL
  Filled 2023-01-24 (×4): qty 1

## 2023-01-24 MED ORDER — SODIUM CHLORIDE 0.9 % IV SOLN
3.0000 g | Freq: Once | INTRAVENOUS | Status: AC
  Administered 2023-01-24: 3 g via INTRAVENOUS
  Filled 2023-01-24: qty 8

## 2023-01-24 MED ORDER — ACETAMINOPHEN 500 MG PO TABS
1000.0000 mg | ORAL_TABLET | Freq: Three times a day (TID) | ORAL | Status: DC
  Administered 2023-01-24 – 2023-01-25 (×4): 1000 mg via ORAL
  Filled 2023-01-24 (×4): qty 2

## 2023-01-24 MED ORDER — BISACODYL 5 MG PO TBEC: 5.0000 mg | DELAYED_RELEASE_TABLET | Freq: Every day | ORAL | Status: DC | PRN

## 2023-01-24 MED ORDER — DOCUSATE SODIUM 100 MG PO CAPS
100.0000 mg | ORAL_CAPSULE | Freq: Two times a day (BID) | ORAL | Status: DC
  Administered 2023-01-24 – 2023-01-25 (×2): 100 mg via ORAL
  Filled 2023-01-24 (×2): qty 1

## 2023-01-24 MED ORDER — SODIUM CHLORIDE 0.9 % IV SOLN
1000.0000 mL | INTRAVENOUS | Status: DC
  Administered 2023-01-24: 1000 mL via INTRAVENOUS

## 2023-01-24 MED ORDER — POLYETHYLENE GLYCOL 3350 17 G PO PACK: 17.0000 g | PACK | Freq: Every day | ORAL | Status: DC | PRN

## 2023-01-24 MED ORDER — FENTANYL CITRATE PF 50 MCG/ML IJ SOSY
25.0000 ug | PREFILLED_SYRINGE | Freq: Once | INTRAMUSCULAR | Status: AC
  Administered 2023-01-24: 25 ug via INTRAVENOUS
  Filled 2023-01-24: qty 1

## 2023-01-24 MED ORDER — TRAMADOL HCL 50 MG PO TABS
50.0000 mg | ORAL_TABLET | Freq: Four times a day (QID) | ORAL | Status: DC | PRN
  Administered 2023-01-24 – 2023-01-25 (×2): 50 mg via ORAL
  Filled 2023-01-24 (×2): qty 1

## 2023-01-24 MED ORDER — SODIUM CHLORIDE 0.9 % IV BOLUS (SEPSIS)
1000.0000 mL | Freq: Once | INTRAVENOUS | Status: AC
  Administered 2023-01-24: 1000 mL via INTRAVENOUS

## 2023-01-24 MED ORDER — HYDRALAZINE HCL 20 MG/ML IJ SOLN: 5.0000 mg | INTRAMUSCULAR | Status: DC | PRN

## 2023-01-24 MED ORDER — SODIUM CHLORIDE 0.9% FLUSH
3.0000 mL | Freq: Two times a day (BID) | INTRAVENOUS | Status: DC
  Administered 2023-01-24 – 2023-01-25 (×3): 3 mL via INTRAVENOUS

## 2023-01-24 MED ORDER — SODIUM CHLORIDE 0.9 % IV SOLN
3.0000 g | Freq: Four times a day (QID) | INTRAVENOUS | Status: DC
  Administered 2023-01-24 – 2023-01-25 (×5): 3 g via INTRAVENOUS
  Filled 2023-01-24 (×6): qty 8

## 2023-01-24 MED ORDER — LACTATED RINGERS IV SOLN: INTRAVENOUS | Status: DC

## 2023-01-24 MED ORDER — LATANOPROST 0.005 % OP SOLN
1.0000 [drp] | Freq: Every day | OPHTHALMIC | Status: DC
  Administered 2023-01-24: 1 [drp] via OPHTHALMIC
  Filled 2023-01-24: qty 2.5

## 2023-01-24 MED ORDER — FENTANYL CITRATE PF 50 MCG/ML IJ SOSY
50.0000 ug | PREFILLED_SYRINGE | INTRAMUSCULAR | Status: DC | PRN
  Administered 2023-01-24: 50 ug via INTRAVENOUS
  Filled 2023-01-24: qty 1

## 2023-01-24 MED ORDER — PANTOPRAZOLE SODIUM 40 MG PO TBEC
40.0000 mg | DELAYED_RELEASE_TABLET | Freq: Every day | ORAL | Status: DC
  Administered 2023-01-24 – 2023-01-25 (×2): 40 mg via ORAL
  Filled 2023-01-24 (×2): qty 1

## 2023-01-24 NOTE — Progress Notes (Signed)
Pharmacy Antibiotic Note  Mary [name] is a 62 y.o. female admitted on 01/23/2023 with  periodontal abscess .  Pharmacy has been consulted for Unasyn dosing - 1x dose already received in the ER. SCr 1.17 on presentation  Plan: Continue Unasyn 3g IV q6h Monitor clinical progress, c/s, renal function F/u de-escalation plan/LOT  Height: 5\' 2"  (157.5 cm) Weight: 68.5 kg (151 lb) IBW/kg (Calculated) : 50.1  Temp (24hrs), Avg:98.2 F (36.8 C), Min:96.5 F (35.8 C), Max:99.1 F (37.3 C)  Recent Labs  Lab 01/24/23 0001  WBC 12.1*  CREATININE 1.17*    Estimated Creatinine Clearance: 46.4 mL/min (A) (by C-G formula based on SCr of 1.17 mg/dL (H)).    Allergies  Allergen Reactions   Ibuprofen Itching   Oxycodone Itching    Pt states no longer makes her itchy   Tape Itching, Rash and Other (See Comments)    THE ONLY TAPE THAT IS TOLERATED IS PAPER TAPE. EKG leads inflame the skin     Leia Alf, PharmD, BCPS Please check AMION for all [facility] contact numbers Clinical Pharmacist 01/24/2023 10:38 AM

## 2023-01-24 NOTE — Progress Notes (Signed)
   01/24/23 1400  Spiritual Encounters  Type of Visit Initial  Care provided to: Patient  Referral source Patient request  Reason for visit Routine spiritual support  OnCall Visit No  Spiritual Framework  Presenting Themes Meaning/purpose/sources of inspiration  Patient Stress Factors Health changes  Interventions  Spiritual Care Interventions Made Compassionate presence;Reflective listening;Prayer   Ch responded to request for spiritual support. There was no family at bedside. Pt is going through spiritual distress and wanted Ch to prayer for her. Ch conveyed a calming presence and offered a word of prayer for healing. Pt expressed gratitude. No follow-up needed at this time.

## 2023-01-24 NOTE — ED Provider Notes (Signed)
[place] EMERGENCY DEPARTMENT AT [facility] Provider Note  CSN: [id number] Arrival date & time: 01/23/23 2248  Chief Complaint(s) Palpitations  HPI Mary [name] is a 62 y.o. 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  Patient reports that the palpitations began this morning.  She also noted some mildly worsening shortness of breath.  No associated chest pain.  She does report mild lightheadedness and nausea without emesis.  She denies any bloody bowel movements or melena.  No recent fevers or known infections.  No coughing or congestion.  She did report that she was scheduled for a dental procedure but had to reschedule due to the INR.  The history is provided by the patient.    Past Medical History Past Medical History:  Diagnosis Date   Abnormal liver function    Acute diastolic heart failure (HCC) 03/15/2013   Alcoholism (HCC)    Anemia    B12 deficiency    Carotid stenosis    Carotid US 2/17: bilateral ICA 1-39% >> FU prn   Cataract    Cataract    OU   Childhood asthma    Chronic diastolic CHF (congestive heart failure) (HCC)    Complete heart block (HCC)    Epigastric hernia 200's   GERD (gastroesophageal reflux disease)    Glaucoma suspect    History of blood transfusion    "14 w/1st pregnancy; 2 w/last C-section" (03/15/2013)   Hx of echocardiogram    a. Echo 4/16:  Mild focal basal septal hypertrophy, EF 60-65%, no RWMA, Mechanical MVR ok with mild central regurgitation and no perivalvular leak, small mobile density attached to valvular ring (1.5x1 cm) - post surgical changes vs SBE, mild LAE;   b. 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   Complex valvuloplasty including Cor-matrix ECM patch augmentation of anterior and lateral leaflets with 26mm Edwards mc3 ring annuloplasty via right mini-thoracotomy approach   Severe mitral regurgitation 04/22/2013   Sinus tachycardia    SVT (supraventricular tachycardia) 03/16/2013   TIA (transient ischemic attack)    Tricuspid regurgitation    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   Pharyngitis 12/06/2022   Neuropathy 12/06/2022   Tachycardia 12/05/2022   Supratherapeutic INR 12/05/2022   Hyperkalemia 12/05/2022   Hypotension due to hypovolemia 07/19/2022   Prolonged QT interval 07/19/2022   Marijuana user 12/24/2020   LFT elevation    Acquired pyloric stenosis    GI bleed 11/24/2020   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   Thiamin deficiency 09/01/2020   Paresthesia    Vitamin B12 deficiency 05/04/2020   Vomiting 04/24/2020   GERD (gastroesophageal reflux disease)    Marijuana abuse    Macrocytic anemia 04/04/2019   Intermittent complete heart block (HCC)    Syncope 03/28/2019   Orthostatic hypotension 01/11/2017  Elevated liver enzymes 01/11/2017   Atypical chest pain 06/05/2016   Acute renal failure superimposed on stage 3a chronic kidney disease (HCC) 01/10/2016   Elevated INR 01/10/2016   TIA (transient ischemic attack) 09/13/2015   CVA (cerebral vascular accident) (HCC) 09/12/2015   SOB (shortness of breath) 02/24/2014   First degree heart block 06/17/2013   (HFpEF) heart failure with preserved ejection fraction (HCC) 06/16/2013   History of bacterial endocarditis 06/16/2013    Warfarin anticoagulation 06/10/2013   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   Femoral hernia 05/23/2013   Mitral valve insufficiency 04/22/2013   Tricuspid regurgitation 04/22/2013   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   SVT (supraventricular tachycardia) (HCC) 03/16/2013   Home Medication(s) Prior to Admission medications   Medication Sig Start Date End Date Taking? Authorizing Provider  acetaminophen (TYLENOL) 500 MG tablet Take 1,000 mg by mouth in the morning, at noon, and at bedtime.   Yes [provider]  folic acid (FOLVITE) 1 MG tablet Take 1 tablet by mouth once daily 01/06/23  Yes Marcine Matar, MD  gabapentin (NEURONTIN) 300 MG capsule TAKE 1 CAPSULE BY MOUTH THREE TIMES DAILY 11/05/22  Yes Marcine Matar, MD  latanoprost (XALATAN) 0.005 % ophthalmic solution Place 1 drop into both eyes at bedtime. 11/06/22  Yes [provider]  pantoprazole (PROTONIX) 40 MG tablet Take 1 tablet (40 mg total) by mouth daily. 01/16/23  Yes Marcine Matar, MD  thiamine 100 MG tablet Take 1 tablet (100 mg total) by mouth daily. 11/22/21  Yes Marcine Matar, MD  traMADol (ULTRAM) 50 MG tablet Take 1 tablet (50 mg total) by mouth at bedtime as needed. 01/23/23  Yes Hoy Register, MD  warfarin (COUMADIN) 5 MG tablet TAKE 5MG  TO 7.5MG  BY MOUTH DAILY AS DIRECTED BY COUMADIN CLINIC Patient taking differently: Take 5-7.5 mg by mouth See admin instructions. Take 5 mg (1 tablet) every Sun, Thu and 7.5 mg (1.5 tablets) all other days as directed by coumadin clinic 12/27/22  Yes Jake Bathe, MD  warfarin (COUMADIN) 7.5 MG tablet Take 1 tablet (7.5 mg total) by mouth every Monday,Wednesday,Friday, and Sunday at 6 PM. Patient not taking: Reported on 01/24/2023 12/09/22   Dorcas Carrow, MD                                                                                                                                    Allergies Ibuprofen,  Oxycodone, and Tape  Review of Systems Review of Systems As noted in HPI  Physical Exam Vital Signs  I have reviewed the triage vital signs BP (!) 149/99   Pulse (!) 54   Temp 98.8 F (37.1 C)   Resp 18   Ht 5\' 2"  (1.575 m)   Wt 68.5 kg   LMP 01/28/2013   SpO2 100%   BMI 27.62 kg/m   Physical Exam Vitals reviewed.  Constitutional:  General: She is not in acute distress.    Appearance: She is well-developed. She is not diaphoretic.  HENT:     Head: Normocephalic and atraumatic.     Nose: Nose normal.     Mouth/Throat:     Dentition: Dental abscesses present.   Eyes:     General: No scleral icterus.       Right eye: No discharge.        Left eye: No discharge.     Conjunctiva/sclera: Conjunctivae normal.     Pupils: Pupils are equal, round, and reactive to light.  Cardiovascular:     Rate and Rhythm: Regular rhythm. Tachycardia present.     Heart sounds: No murmur heard.    No friction rub. No gallop.  Pulmonary:     Effort: Pulmonary effort is normal. No respiratory distress.     Breath sounds: Normal breath sounds. No stridor. No rales.  Abdominal:     General: There is no distension.     Palpations: Abdomen is soft.     Tenderness: There is no abdominal tenderness.  Musculoskeletal:        General: No tenderness.     Cervical back: Normal range of motion and neck supple.  Skin:    General: Skin is warm and dry.     Findings: No erythema or rash.  Neurological:     Mental Status: She is alert and oriented to person, place, and time.     ED Results and Treatments Labs (all labs ordered are listed, but only abnormal results are displayed) Labs Reviewed  BASIC METABOLIC PANEL - Abnormal; Notable for the following components:      Result Value   CO2 20 (*)    Glucose, Bld 104 (*)    Creatinine, Ser 1.17 (*)    GFR, Estimated 53 (*)    All other components within normal limits  CBC - Abnormal; Notable for the following components:   WBC 12.1 (*)     RBC 3.60 (*)    MCV 104.4 (*)    All other components within normal limits  PROTIME-INR - Abnormal; Notable for the following components:   Prothrombin Time 43.6 (*)    INR 4.7 (*)    All other components within normal limits  BRAIN NATRIURETIC PEPTIDE - Abnormal; Notable for the following components:   B Natriuretic Peptide 168.0 (*)    All other components within normal limits  TROPONIN I (HIGH SENSITIVITY)  TROPONIN I (HIGH SENSITIVITY)                                                                                                                         EKG  EKG Interpretation  Date/Time:  Monday January 23 2023 23:27:36 EDT Ventricular Rate:  106 PR Interval:  124 QRS Duration: 107 QT Interval:  350 QTC Calculation: 465 R Axis:   -42 Text Interpretation: Sinus tachycardia Left axis deviation Abnormal R-wave progression, early transition Confirmed by Drema Pry [phone])  on 01/24/2023 1:55:22 AM       Radiology CT Maxillofacial W Contrast  Result Date: 01/24/2023 CLINICAL DATA:  62 year old female with possible maxillary dental abscess. EXAM: CT MAXILLOFACIAL WITH CONTRAST TECHNIQUE: Multidetector CT imaging of the maxillofacial structures was performed with intravenous contrast. Multiplanar CT image reconstructions were also generated. RADIATION DOSE REDUCTION: This exam was performed according to the departmental dose-optimization program which includes automated exposure control, adjustment of the mA and/or kV according to patient size and/or use of iterative reconstruction technique. CONTRAST:  75mL OMNIPAQUE IOHEXOL 350 MG/ML SOLN COMPARISON:  Neck CT 12/05/2022. FINDINGS: Osseous: Mandible intact and normally located. Previous mandible tooth extractions but no mandible acute dental finding. Right maxillary anterior molar dental caries and periapical lucency. And cortical dehiscence at the lateral root of that tooth on series 5, image 29. See soft tissue findings below.  Elsewhere the bilateral maxilla, zygoma, pterygoid, and nasal bones appear intact. Central skull base intact. Visible cervical vertebrae appear intact and aligned. Orbits: No orbital wall fracture. Bilateral orbits soft tissues appears symmetric, negative. Sinuses: Visualized paranasal sinuses and mastoids are stable and well aerated. Tympanic cavities are clear. Soft tissues: Visible thyroid, larynx, pharynx, parapharyngeal spaces, retropharyngeal space, submandibular, sublingual, and parotid spaces are within normal limits. There is soft tissue inflammation in the right buccal space surrounding the anterior right maxillary molar (series 7, image 30) and on the same image a medial subperiosteal abscess affecting the gums is visible and rim enhancing. See also series 3, image 32, 33. Rim enhancing medial fluid collection there is approximately 10 x 4 x 6 mm, although may track posteriorly as seen on image 33. No soft tissue gas. No periosteal abscess visible along the lateral margin of the tooth. The right masticator space is relatively spared. Small, enhancing and reactive appearing right level 1 and level 2 lymph nodes. Limited intracranial: Major vascular structures in the visible face and at the skull base are enhancing and appear patent including the cavernous sinus. Negative visible brain parenchyma. IMPRESSION: 1. Positive for carious right maxillary anterior molar dental caries and periapical dehiscence. Surrounding soft tissue inflammation, and medial gumline Subperiosteal Abscess (1 cm series 3, image 32) which might track further posteriorly (image 33). 2. Reactive right level 1 and level 2 lymph nodes. No other complicating features. Electronically Signed   By: Odessa Fleming M.D.   On: 01/24/2023 04:12   DG Chest 2 View  Result Date: 01/23/2023 CLINICAL DATA:  Shortness of breath and palpitations EXAM: CHEST - 2 VIEW COMPARISON:  Radiographs 12/05/2022 FINDINGS: Stable cardiomegaly. Cardiac valve  prostheses. Left basilar scarring or atelectasis. No focal pneumonia, pleural effusion, or pneumothorax. No displaced rib fractures. IMPRESSION: No active cardiopulmonary disease.  Cardiomegaly. Electronically Signed   By: Minerva Fester M.D.   On: 01/23/2023 23:45    Medications Ordered in ED Medications  sodium chloride 0.9 % bolus 1,000 mL (0 mLs Intravenous Stopped 01/24/23 0824)    Followed by  0.9 %  sodium chloride infusion (1,000 mLs Intravenous New Bag/Given 01/24/23 0825)  fentaNYL (SUBLIMAZE) injection 50 mcg (50 mcg Intravenous Given 01/24/23 0828)  fentaNYL (SUBLIMAZE) injection 25 mcg (25 mcg Intravenous Given 01/24/23 0339)  sodium chloride 0.9 % bolus 500 mL (0 mLs Intravenous Stopped 01/24/23 0541)  iohexol (OMNIPAQUE) 350 MG/ML injection 75 mL (75 mLs Intravenous Contrast Given 01/24/23 0351)  Ampicillin-Sulbactam (UNASYN) 3 g in sodium chloride 0.9 % 100 mL IVPB (0 g Intravenous Stopped 01/24/23 0559)  Procedures Ultrasound ED Peripheral IV (Provider)  Date/Time: 01/24/2023 2:37 AM  Performed by: Nira Conn, MD Authorized by: Nira Conn, MD   Procedure details:    Indications: multiple failed IV attempts     Skin Prep: chlorhexidine gluconate     Location:  Left anterior forearm   Angiocath:  20 G   Bedside Ultrasound Guided: Yes     Images: archived     Patient tolerated procedure without complications: Yes     Dressing applied: Yes   .Critical Care  Performed by: Nira Conn, MD Authorized by: Nira Conn, MD   Critical care provider statement:    Critical care time (minutes):  40   Critical care was necessary to treat or prevent imminent or life-threatening deterioration of the following conditions: dental abscess in settin of supratherapeutic INR requring multiple discussions with  consultants to determine disposition.   Critical care was time spent personally by me on the following activities:  Development of treatment plan with patient or surrogate, discussions with consultants, evaluation of patient's response to treatment, examination of patient, ordering and review of laboratory studies, ordering and review of radiographic studies, ordering and performing treatments and interventions, pulse oximetry, re-evaluation of patient's condition and review of old charts   (including critical care time)  Medical Decision Making / ED Course  Click here for ABCD2, HEART and other calculators  Medical Decision Making Amount and/or Complexity of Data Reviewed Labs: ordered. Radiology: ordered.  Risk Prescription drug management. Decision regarding hospitalization.    This patient presents to the ED for concern of noted below, this involves an extensive number of treatment options, and is a complaint that carries with it a high risk of complications and morbidity. The differential diagnosis includes but not limited to:  Palpitations Patient is noted to be in sinus tachycardia.  Will need to assess for evidence of severe anemia, patient has evidence of a dental abscess and will need to determine the extent of the infection.  Will also assess for evidence of heart failure.  Will check for electrolyte or metabolic derangements.  Supratherapeutic INR We will recheck INR to confirm.  Patient's dental infection may be contributing to her INR level   Initial intervention:  IVF  fentanyl  Work up Interpretation and Management:  Cardiac Monitoring/EKG: Telemetry with normal sinus rhythm. EKG with normal sinus rhythm.  No acute ischemic changes, dysrhythmias or blocks.  Laboratory Tests ordered listed below with my independent interpretation: CBC with leukocytosis.  No anemia. BMP without significant electrolyte derangements or renal insufficiency. BNP less than  200 Troponin negative INR 4.7   Imaging Studies ordered listed below with my independent interpretation: Chest x-ray without evidence of pneumonia, pneumothorax, pulmonary edema pleural effusion CT face notable for subperiodontal abscess Patient was started on IV antibiotics Consulted dentistry who stated that patient will require oral surgeon. I consulted medicine team and spoke with Dr. Loney Loh who wanted to wait until the morning to ensure that oral surgery would be available In the morning I spoke with Dr. Ophelia Charter from the hospitalist service who agreed to admit patient.  She will reach out to the oral surgeon when available.        Final Clinical Impression(s) / ED Diagnoses Final diagnoses:  Dental abscess  Supratherapeutic INR           This chart was dictated using voice recognition software.  Despite best efforts to proofread,  errors can occur which can change the documentation meaning.  Nira Conn, MD 01/24/23 [phone]

## 2023-01-24 NOTE — Progress Notes (Signed)
ANTICOAGULATION CONSULT NOTE  Pharmacy Consult for warfarin Indication:  mechanical MVR   Assessment: 61 yof on warfarin PTA for mechanical MVR with recent dental abscess presenting (originally scheduled for oral surgery on day of admission) with increased drowsiness. INR has been supratherapeutic recently, 6.7 at most recent outpatient visit 4/29. Patient told to hold warfarin x 4 days (4/29-5/2) and resume 5/3. INR down to 4.6 today. Noted has not been able to eat well PTA. Patient started on Unasyn inpatient with plan for outpatient f/u with oral surgeon.  PTA warfarin dose: 7.5mg  daily except 5mg  on Sun/Thurs (last dose 4/28 PTA)  Goal of Therapy:  INR 2.5-3.5 Monitor platelets by anticoagulation protocol: Yes   Plan:  Hold warfarin again tonight Daily INR Monitor CBC, s/sx bleeding  Babs Bertin, PharmD, BCPS Clinical Pharmacist 01/24/2023 10:39 AM

## 2023-01-24 NOTE — H&P (Signed)
History and Physical    Patient: Mary [name] ZOX:[id number] DOB: 1961/05/26 DOA: 01/23/2023 DOS: the patient was seen and examined on 01/24/2023 PCP: Marcine Matar, MD  Patient coming from: Home - lives alone; NOK: Daughter, Philipp Ovens, [phone]   Chief Complaint: elevated INR  HPI: Mary [name] is a 62 y.o. female with medical history significant of chronic diastolic CHF, mechanical mitral valve on Coumadin, and CVA who was admitted last month for dental pain secondary to infected R molar.  She returned yesterday after cardiology appointment showed her INR was 6.7.  She went to get her INR checked "and it was sky high."  She went home and started feeling drowsy.  She was told if she felt different than usual to come to the ER so she did.  They told her not to take Coumadin for 4 days.  She has been having dental pain that started April 1.  She was admitted to the hospital and sent home with everything she needed but she was unable to get scheduled until today - was scheduled for surgery today with oral surgeon.  She has been having more problems with the tooth.  She has not been eating well the last couple of days.    ER Course:  Carryover, per Dr. Loney Loh:  Sent by cardiology for INR 6.7.  HR 110-120s.  WBC 12.1, INR 4.7 in ER.  Patient complained of dental pain and maxillofacial CT showed R maxillary anterior molar caries/periapical dehiscence and subperiosteal abscess.  Given IVF and Unasyn.  EDP spoke with Dr. Mia Creek, who recommended IV abx and outpatient f/u.     Review of Systems: As mentioned in the history of present illness. All other systems reviewed and are negative. 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    Protein calorie malnutrition (HCC)    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   33mm Sorin Carbomedics mechanical prosthesis via right mini thoracotomy approach   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   Complex valvuloplasty including Cor-matrix ECM patch augmentation of anterior and lateral leaflets with 26mm Edwards mc3 ring annuloplasty via right mini-thoracotomy approach   Sinus tachycardia    SVT (supraventricular tachycardia) 03/16/2013   TIA (transient ischemic attack)    Past Surgical History:  Procedure Laterality Date   APPENDECTOMY  ~1978   BALLOON DILATION N/A 11/27/2020   Procedure: BALLOON DILATION;  Surgeon: Iva Boop, MD;  Location: [facility] ENDOSCOPY;  Service: Endoscopy;  Laterality: N/A;   BIOPSY  05/01/2020   Procedure: BIOPSY;  Surgeon: Rachael Fee, MD;  Location: [facility] ENDOSCOPY;  Service: Endoscopy;;   CARDIAC CATHETERIZATION     CARDIAC VALVE REPLACEMENT     CESAREAN SECTION  1983   CESAREAN SECTION WITH BILATERAL TUBAL LIGATION  1999   COLONOSCOPY WITH PROPOFOL N/A 05/01/2020   Procedure: COLONOSCOPY WITH PROPOFOL;  Surgeon: Rachael Fee, MD;  Location: [facility] ENDOSCOPY;  Service: Endoscopy;  Laterality: N/A;   ESOPHAGOGASTRODUODENOSCOPY (EGD) WITH PROPOFOL N/A 05/01/2020   Procedure: ESOPHAGOGASTRODUODENOSCOPY (EGD) WITH PROPOFOL;  Surgeon: Rachael Fee, MD;  Location: [facility] ENDOSCOPY;  Service: Endoscopy;  Laterality: N/A;   ESOPHAGOGASTRODUODENOSCOPY (EGD) WITH PROPOFOL N/A 11/27/2020   Procedure: ESOPHAGOGASTRODUODENOSCOPY (EGD) WITH PROPOFOL;  Surgeon: Iva Boop, MD;  Location: [facility] ENDOSCOPY;  Service:  Endoscopy;  Laterality: N/A;   FEMORAL HERNIA REPAIR Right 05/23/2013   Procedure: HERNIA REPAIR FEMORAL;  Surgeon: Purcell Nails, MD;  Location: [facility] OR;  Service: Open Heart Surgery;  Laterality: Right;   INTRAOPERATIVE TRANSESOPHAGEAL ECHOCARDIOGRAM N/A 05/23/2013   Procedure: INTRAOPERATIVE TRANSESOPHAGEAL ECHOCARDIOGRAM;  Surgeon: Purcell Nails, MD;  Location: [facility] OR;  Service: Open Heart Surgery;  Laterality: N/A;   LAPAROSCOPIC CHOLECYSTECTOMY  2003   LEFT AND RIGHT HEART CATHETERIZATION WITH CORONARY ANGIOGRAM N/A 03/22/2013   Procedure: LEFT AND RIGHT HEART CATHETERIZATION WITH CORONARY ANGIOGRAM;  Surgeon: Kathleene Hazel, MD;  Location: [facility] CATH LAB;  Service: Cardiovascular;  Laterality: N/A;   MINIMALLY INVASIVE TRICUSPID VALVE REPAIR Right 05/23/2013   Procedure: MINIMALLY INVASIVE TRICUSPID VALVE REPAIR;  Surgeon: Purcell Nails, MD;  Location: [facility] OR;  Service: Open Heart Surgery;  Laterality: Right;   MITRAL VALVE REPLACEMENT N/A 05/23/2013   Procedure: MITRAL VALVE (MV) REPLACEMENT;  Surgeon: Purcell Nails, MD;  Location: [facility] OR;  Service: Open Heart Surgery;  Laterality: N/A;   MULTIPLE EXTRACTIONS WITH ALVEOLOPLASTY N/A 04/04/2013   Procedure: Extraction of tooth #'s 1,8,9,13,14,15,23,24,25,26 with alveoloplasty and gross debridement of remaining teeth;  Surgeon: Charlynne Pander, DDS;  Location: [facility] OR;  Service: Oral Surgery;  Laterality: N/A;   TEE WITHOUT CARDIOVERSION N/A 03/18/2013   Procedure: TRANSESOPHAGEAL ECHOCARDIOGRAM (TEE);  Surgeon: Laurey Morale, MD;  Location: [facility] ENDOSCOPY;  Service: Cardiovascular;  Laterality: N/A;   TEE WITHOUT CARDIOVERSION N/A 06/17/2013   Procedure: TRANSESOPHAGEAL ECHOCARDIOGRAM (TEE);  Surgeon: Lewayne Bunting, MD;  Location: [facility] ENDOSCOPY;  Service: Cardiovascular;  Laterality: N/A;   TUBAL LIGATION  1999   Social History:  reports that she quit smoking about 2 years ago. Her smoking use included cigarettes. She has a 23.00  pack-year smoking history. She has never used smokeless tobacco. She reports that she does not currently use alcohol. She reports current drug use. Drug: Marijuana.  Allergies  Allergen Reactions   Ibuprofen Itching   Oxycodone Itching    Pt states no longer makes her itchy   Tape Itching, Rash and Other (See Comments)    THE ONLY TAPE THAT IS TOLERATED IS PAPER TAPE. EKG leads inflame the skin    Family History  Problem Relation Age of Onset   Kidney disease Mother        Had one Kidney removed   Stroke Father    Cancer Father    Sarcoidosis Sister    Heart attack Neg Hx    Colon cancer Neg Hx    Stomach cancer Neg Hx    Pancreatic cancer Neg Hx    Esophageal cancer Neg Hx     Prior to Admission medications   Medication Sig Start Date End Date Taking? Authorizing Provider  acetaminophen (TYLENOL) 500 MG tablet Take 1,000 mg by mouth in the morning, at noon, and at bedtime.   Yes [provider]  folic acid (FOLVITE) 1 MG tablet Take 1  tablet by mouth once daily 01/06/23  Yes Marcine Matar, MD  gabapentin (NEURONTIN) 300 MG capsule TAKE 1 CAPSULE BY MOUTH THREE TIMES DAILY 11/05/22  Yes Marcine Matar, MD  latanoprost (XALATAN) 0.005 % ophthalmic solution Place 1 drop into both eyes at bedtime. 11/06/22  Yes [provider]  pantoprazole (PROTONIX) 40 MG tablet Take 1 tablet (40 mg total) by mouth daily. 01/16/23  Yes Marcine Matar, MD  thiamine 100 MG tablet Take 1 tablet (100 mg total) by mouth daily. 11/22/21  Yes Marcine Matar, MD  traMADol (ULTRAM) 50 MG tablet Take 1 tablet (50 mg total) by mouth at bedtime as needed. 01/23/23  Yes Hoy Register, MD  warfarin (COUMADIN) 5 MG tablet TAKE 5MG  TO 7.5MG  BY MOUTH DAILY AS DIRECTED BY COUMADIN CLINIC Patient taking differently: Take 5-7.5 mg by mouth See admin instructions. Take 5 mg (1 tablet) every Sun, Thu and 7.5 mg (1.5 tablets) all other days as directed by coumadin clinic 12/27/22  Yes Jake Bathe, MD  warfarin (COUMADIN) 7.5 MG tablet Take 1 tablet (7.5 mg total) by mouth every Monday,Wednesday,Friday, and Sunday at 6 PM. Patient not taking: Reported on 01/24/2023 12/09/22   Dorcas Carrow, MD    Physical Exam: Vitals:   01/24/23 0930 01/24/23 1145 01/24/23 1333 01/24/23 1447  BP: 138/85  119/75 138/64  Pulse: (!) 108  (!) 102 97  Resp: 16  17 18   Temp:  98 F (36.7 C)  98.4 F (36.9 C)  TempSrc:    Oral  SpO2: 100%  100% 98%  Weight:      Height:       General:  Appears calm and comfortable and is in NAD Eyes:  EOMI, normal lids, iris ENT:  grossly normal hearing, lips & tongue, mmm; poor dentition with gum edema along the upper molar ridge and apparent gross decay Neck:  no LAD, masses or thyromegaly Cardiovascular:  RR with mild tachycardia, no m/r/g. No LE edema.  Respiratory:   CTA bilaterally with no wheezes/rales/rhonchi.  Normal respiratory effort. Abdomen:  soft, NT, ND Skin:  no rash or induration seen on limited exam Musculoskeletal:  grossly normal tone BUE/BLE, good ROM, no bony abnormality Psychiatric:  blunted/somnolent mood and affect, speech fluent and appropriate, AOx3 Neurologic:  CN 2-12 grossly intact, moves all extremities in coordinated fashion   Radiological Exams on Admission: Independently reviewed - see discussion in A/P where applicable  CT Maxillofacial W Contrast  Result Date: 01/24/2023 CLINICAL DATA:  62 year old female with possible maxillary dental abscess. EXAM: CT MAXILLOFACIAL WITH CONTRAST TECHNIQUE: Multidetector CT imaging of the maxillofacial structures was performed with intravenous contrast. Multiplanar CT image reconstructions were also generated. RADIATION DOSE REDUCTION: This exam was performed according to the departmental dose-optimization program which includes automated exposure control, adjustment of the mA and/or kV according to patient size and/or use of iterative reconstruction technique. CONTRAST:  75mL OMNIPAQUE  IOHEXOL 350 MG/ML SOLN COMPARISON:  Neck CT 12/05/2022. FINDINGS: Osseous: Mandible intact and normally located. Previous mandible tooth extractions but no mandible acute dental finding. Right maxillary anterior molar dental caries and periapical lucency. And cortical dehiscence at the lateral root of that tooth on series 5, image 29. See soft tissue findings below. Elsewhere the bilateral maxilla, zygoma, pterygoid, and nasal bones appear intact. Central skull base intact. Visible cervical vertebrae appear intact and aligned. Orbits: No orbital wall fracture. Bilateral orbits soft tissues appears symmetric, negative. Sinuses: Visualized paranasal sinuses and mastoids are stable  and well aerated. Tympanic cavities are clear. Soft tissues: Visible thyroid, larynx, pharynx, parapharyngeal spaces, retropharyngeal space, submandibular, sublingual, and parotid spaces are within normal limits. There is soft tissue inflammation in the right buccal space surrounding the anterior right maxillary molar (series 7, image 30) and on the same image a medial subperiosteal abscess affecting the gums is visible and rim enhancing. See also series 3, image 32, 33. Rim enhancing medial fluid collection there is approximately 10 x 4 x 6 mm, although may track posteriorly as seen on image 33. No soft tissue gas. No periosteal abscess visible along the lateral margin of the tooth. The right masticator space is relatively spared. Small, enhancing and reactive appearing right level 1 and level 2 lymph nodes. Limited intracranial: Major vascular structures in the visible face and at the skull base are enhancing and appear patent including the cavernous sinus. Negative visible brain parenchyma. IMPRESSION: 1. Positive for carious right maxillary anterior molar dental caries and periapical dehiscence. Surrounding soft tissue inflammation, and medial gumline Subperiosteal Abscess (1 cm series 3, image 32) which might track further posteriorly  (image 33). 2. Reactive right level 1 and level 2 lymph nodes. No other complicating features. Electronically Signed   By: Odessa Fleming M.D.   On: 01/24/2023 04:12   DG Chest 2 View  Result Date: 01/23/2023 CLINICAL DATA:  Shortness of breath and palpitations EXAM: CHEST - 2 VIEW COMPARISON:  Radiographs 12/05/2022 FINDINGS: Stable cardiomegaly. Cardiac valve prostheses. Left basilar scarring or atelectasis. No focal pneumonia, pleural effusion, or pneumothorax. No displaced rib fractures. IMPRESSION: No active cardiopulmonary disease.  Cardiomegaly. Electronically Signed   By: Minerva Fester M.D.   On: 01/23/2023 23:45    EKG: Independently reviewed.  Sinus tachycardia with rate 106; nonspecific ST changes with no evidence of acute ischemia   Labs on Admission: I have personally reviewed the available labs and imaging studies at the time of the admission.  Pertinent labs:    BUN 17/Creatinine 1.17/GFR 53 - stable Albumin 3.1 BNP 168 HS troponin 11, 11 WBC 12.1 -> 11.5 Hgb 12.1 -> 10.4 INR 6.7 -> 4.7 -> 4.2   Assessment and Plan: Principal Problem:   Supratherapeutic INR Active Problems:   S/P mitral valve replacement with metallic valve   (HFpEF) heart failure with preserved ejection fraction (HCC)   Neuropathy   Glaucoma (increased eye pressure)    Supratherapeutic INR, mechanical valve -She was seen in cardiology clinic yesterday and found to have supratherapeutic INR, with confirmed INR 6.7 -Improving with holding Coumadin, 4.7 -> 4.2. -No active bleeding -Pharmacy to dose INR order previously placed and so they can assist with management of this issue. -Goal INR is 2.5-3.5 due to mechanical valve -Will observe overnight on telemetry (persistent tachycardia) -Repeat INR in AM   Dental caries with subperiosteal abscess -Prior admission for dental pain, abscess -She was treated with abx and was planned for dental extraction today but this was canceled due to elevated  INR -D/W Dr. Chales Salmon, will take to the office and care for it when INR is better, possibly even same-day appointment.   -Recommend calling him at time of dc to arrange (he remains on call through 5/6) -Treat with Unasyn for now, Amoxil at time of dc per Dr. Delma Post -Soft diet  Chronic diastolic CHF -3/14 echo with low normal EF, 50-55%, indeterminate diastolic parameters -No current apparent CHF symptoms, euvolemic  Neuropathy -Continue gabapentin  Glaucoma -Continue latanoprost  Marijuana abuse -Cessation encouraged; this should be encouraged  on an ongoing basis    Advance Care Planning:   Code Status: Full Code - Code status was discussed with the patient and/or family at the time of admission.  The patient would want to receive full resuscitative measures at this time.   Consults: Dentist/oral surgeon (telephone only); TOC team; nutrition  DVT Prophylaxis: SCDs  Family Communication: None present; she is capable of communicating with family at this time  Severity of Illness: The appropriate patient status for this patient is OBSERVATION. Observation status is judged to be reasonable and necessary in order to provide the required intensity of service to ensure the patient's safety. The patient's presenting symptoms, physical exam findings, and initial radiographic and laboratory data in the context of their medical condition is felt to place them at decreased risk for further clinical deterioration. Furthermore, it is anticipated that the patient will be medically stable for discharge from the hospital within 2 midnights of admission.   Author: Jonah Blue, MD 01/24/2023 2:53 PM  For on call review [email or web address].

## 2023-01-24 NOTE — ED Notes (Signed)
ED TO INPATIENT HANDOFF REPORT  ED Nurse Name and Phone #:   S Name/Age/Gender Mary [name] 62 y.o. female Room/Bed: 040C/040C  Code Status   Code Status: Full Code  Home/SNF/Other Home Patient oriented to: self, place, time, and situation Is this baseline? Yes   Triage Complete: Triage complete  Chief Complaint Supratherapeutic INR [R79.1]  Triage Note Pt presents w/ elevated INR lvls from cardiology appointment today 6.7. Pt began experiencing palpitations, nausea and dizziness that started today. Pt has not fallen but is unsteady from prior stroke, residuals unsteady gait. PMH of stroke, mitral valve replacement and one replaced.    Allergies Allergies  Allergen Reactions   Ibuprofen Itching   Oxycodone Itching    Pt states no longer makes her itchy   Tape Itching, Rash and Other (See Comments)    THE ONLY TAPE THAT IS TOLERATED IS PAPER TAPE. EKG leads inflame the skin    Level of Care/Admitting Diagnosis ED Disposition     ED Disposition  Admit   Condition  --   Comment  Hospital Area: [facility] [[id number]]  Level of Care: Telemetry Medical [104]  May place patient in observation at [facility] or [facility] if equivalent level of care is available:: Yes  Covid Evaluation: Asymptomatic - no recent exposure (last 10 days) testing not required  Diagnosis: Supratherapeutic INR [161096]  Admitting Physician: Jonah Blue [[id number]]  Attending Physician: Jonah Blue [[id number]]          B Medical/Surgery History Past Medical History:  Diagnosis Date   Abnormal liver function    Acute diastolic heart failure (HCC) 03/15/2013   Alcoholism (HCC)    Anemia    B12 deficiency    Carotid stenosis    Carotid US 2/17: bilateral ICA 1-39% >> FU prn   Cataract    Cataract    OU   Childhood asthma    Chronic diastolic CHF (congestive heart failure) (HCC)    Complete heart block (HCC)    Epigastric hernia 200's   GERD (gastroesophageal  reflux disease)    Glaucoma suspect    History of blood transfusion    "14 w/1st pregnancy; 2 w/last C-section" (03/15/2013)   Hx of echocardiogram    a. Echo 4/16:  Mild focal basal septal hypertrophy, EF 60-65%, no RWMA, Mechanical MVR ok with mild central regurgitation and no perivalvular leak, small mobile density attached to valvular ring (1.5x1 cm) - post surgical changes vs SBE, mild LAE;   b. 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   Complex valvuloplasty including Cor-matrix ECM patch augmentation of anterior and lateral leaflets with 26mm Edwards mc3 ring annuloplasty via right mini-thoracotomy approach   Severe mitral regurgitation 04/22/2013   Sinus tachycardia    SVT (supraventricular tachycardia) 03/16/2013   TIA (transient ischemic attack)    Tricuspid regurgitation    Past Surgical History:  Procedure Laterality Date   APPENDECTOMY  ~1978   BALLOON DILATION N/A 11/27/2020   Procedure: BALLOON DILATION;  Surgeon: Iva Boop, MD;  Location: [facility] ENDOSCOPY;  Service: Endoscopy;  Laterality: N/A;   BIOPSY  05/01/2020   Procedure: BIOPSY;  Surgeon: Rachael Fee, MD;  Location: [facility] ENDOSCOPY;  Service: Endoscopy;;   CARDIAC CATHETERIZATION     CARDIAC VALVE REPLACEMENT     CESAREAN SECTION  1983   CESAREAN SECTION WITH BILATERAL TUBAL LIGATION  1999   COLONOSCOPY WITH PROPOFOL N/A 05/01/2020   Procedure: COLONOSCOPY WITH PROPOFOL;  Surgeon: Rachael Fee, MD;  Location: [facility] ENDOSCOPY;  Service: Endoscopy;  Laterality: N/A;    ESOPHAGOGASTRODUODENOSCOPY (EGD) WITH PROPOFOL N/A 05/01/2020   Procedure: ESOPHAGOGASTRODUODENOSCOPY (EGD) WITH PROPOFOL;  Surgeon: Rachael Fee, MD;  Location: [facility] ENDOSCOPY;  Service: Endoscopy;  Laterality: N/A;   ESOPHAGOGASTRODUODENOSCOPY (EGD) WITH PROPOFOL N/A 11/27/2020   Procedure: ESOPHAGOGASTRODUODENOSCOPY (EGD) WITH PROPOFOL;  Surgeon: Iva Boop, MD;  Location: [facility] ENDOSCOPY;  Service: Endoscopy;  Laterality: N/A;   FEMORAL HERNIA REPAIR Right 05/23/2013   Procedure: HERNIA REPAIR FEMORAL;  Surgeon: Purcell Nails, MD;  Location: [facility] OR;  Service: Open Heart Surgery;  Laterality: Right;   INTRAOPERATIVE TRANSESOPHAGEAL ECHOCARDIOGRAM N/A 05/23/2013   Procedure: INTRAOPERATIVE TRANSESOPHAGEAL ECHOCARDIOGRAM;  Surgeon: Purcell Nails, MD;  Location: [facility] OR;  Service: Open Heart Surgery;  Laterality: N/A;   LAPAROSCOPIC CHOLECYSTECTOMY  2003   LEFT AND RIGHT HEART CATHETERIZATION WITH CORONARY ANGIOGRAM N/A 03/22/2013   Procedure: LEFT AND RIGHT HEART CATHETERIZATION WITH CORONARY ANGIOGRAM;  Surgeon: Kathleene Hazel, MD;  Location: [facility] CATH LAB;  Service: Cardiovascular;  Laterality: N/A;   MINIMALLY INVASIVE TRICUSPID VALVE REPAIR Right 05/23/2013   Procedure: MINIMALLY INVASIVE TRICUSPID VALVE REPAIR;  Surgeon: Purcell Nails, MD;  Location: [facility] OR;  Service: Open Heart Surgery;  Laterality: Right;   MITRAL VALVE REPLACEMENT N/A 05/23/2013   Procedure: MITRAL VALVE (MV) REPLACEMENT;  Surgeon: Purcell Nails, MD;  Location: [facility] OR;  Service: Open Heart Surgery;  Laterality: N/A;   MULTIPLE EXTRACTIONS WITH ALVEOLOPLASTY N/A 04/04/2013   Procedure: Extraction of tooth #'s 1,8,9,13,14,15,23,24,25,26 with alveoloplasty and gross debridement of remaining teeth;  Surgeon: Charlynne Pander, DDS;  Location: [facility] OR;  Service: Oral Surgery;  Laterality: N/A;   TEE WITHOUT CARDIOVERSION N/A 03/18/2013   Procedure: TRANSESOPHAGEAL ECHOCARDIOGRAM (TEE);  Surgeon: Laurey Morale, MD;  Location:  [facility] ENDOSCOPY;  Service: Cardiovascular;  Laterality: N/A;   TEE WITHOUT CARDIOVERSION N/A 06/17/2013   Procedure: TRANSESOPHAGEAL ECHOCARDIOGRAM (TEE);  Surgeon: Lewayne Bunting, MD;  Location: [facility] ENDOSCOPY;  Service: Cardiovascular;  Laterality: N/A;   TUBAL LIGATION  1999     A IV Location/Drains/Wounds Patient Lines/Drains/Airways Status     Active Line/Drains/Airways     Name Placement date Placement time Site Days   Peripheral IV 01/24/23 20 G Left Antecubital 01/24/23  0330  Antecubital  less than 1            Intake/Output Last 24 hours  Intake/Output Summary (Last 24 hours) at 01/24/2023 1217 Last data filed at 01/24/2023 0559 Gross per 24 hour  Intake 596.58 ml  Output --  Net 596.58 ml    Labs/Imaging Results for orders placed or performed during the hospital encounter of 01/23/23 (from the past 48 hour(s))  Basic metabolic panel     Status: Abnormal   Collection Time: 01/24/23 12:01 AM  Result Value Ref Range   Sodium 136 135 - 145 mmol/L   Potassium 4.2 3.5 - 5.1 mmol/L   Chloride 105 98 - 111 mmol/L   CO2 20 (  L) 22 - 32 mmol/L   Glucose, Bld 104 (H) 70 - 99 mg/dL    Comment: Glucose reference range applies only to samples taken after fasting for at least 8 hours.   BUN 17 6 - 20 mg/dL   Creatinine, Ser 1.61 (H) 0.44 - 1.00 mg/dL   Calcium 9.2 8.9 - 09.6 mg/dL   GFR, Estimated 53 (L) >60 mL/min    Comment: (NOTE) Calculated using the CKD-EPI Creatinine Equation (2021)    Anion gap 11 5 - 15    Comment: Performed at [facility], [address], [place], [place] [address]  CBC     Status: Abnormal   Collection Time: 01/24/23 12:01 AM  Result Value Ref Range   WBC 12.1 (H) 4.0 - 10.5 K/uL   RBC 3.60 (L) 3.87 - 5.11 MIL/uL   Hemoglobin 12.1 12.0 - 15.0 g/dL   HCT 98.1 19.1 - 47.8 %   MCV 104.4 (H) 80.0 - 100.0 fL   MCH 33.6 26.0 - 34.0 pg   MCHC 32.2 30.0 - 36.0 g/dL   RDW 29.5 62.1 - 30.8 %   Platelets 324 150 - 400 K/uL   nRBC 0.0 0.0 -  0.2 %    Comment: Performed at [facility], [address], [place], [place] [address]  Troponin I (High Sensitivity)     Status: None   Collection Time: 01/24/23 12:01 AM  Result Value Ref Range   Troponin I (High Sensitivity) 11 <18 ng/L    Comment: (NOTE) Elevated high sensitivity troponin I (hsTnI) values and significant  changes across serial measurements may suggest ACS but many other  chronic and acute conditions are known to elevate hsTnI results.  Refer to the "Links" section for chest pain algorithms and additional  guidance. Performed at [facility], [address] [place], [place] [address]   Protime-INR     Status: Abnormal   Collection Time: 01/24/23 12:01 AM  Result Value Ref Range   Prothrombin Time 43.6 (H) 11.4 - 15.2 seconds   INR 4.7 (HH) 0.8 - 1.2    Comment: REPEATED TO VERIFY CRITICAL RESULT CALLED TO, READ BACK BY AND VERIFIED WITH: Vito Backers, RN AT 0130 ON 01/24/23 BY H. HOWARD. (NOTE) INR goal varies based on device and disease states. Performed at [facility], [address], [place], [place] [address]   Brain natriuretic peptide     Status: Abnormal   Collection Time: 01/24/23 12:01 AM  Result Value Ref Range   B Natriuretic Peptide 168.0 (H) 0.0 - 100.0 pg/mL    Comment: Performed at [facility], [address] [place], [place] [address]  Troponin I (High Sensitivity)     Status: None   Collection Time: 01/24/23  1:36 AM  Result Value Ref Range   Troponin I (High Sensitivity) 11 <18 ng/L    Comment: (NOTE) Elevated high sensitivity troponin I (hsTnI) values and significant  changes across serial measurements may suggest ACS but many other  chronic and acute conditions are known to elevate hsTnI results.  Refer to the "Links" section for chest pain algorithms and additional  guidance. Performed at [facility], [address], [place], [place] [address]   CBC with Differential/Platelet     Status: Abnormal    Collection Time: 01/24/23 10:13 AM  Result Value Ref Range   WBC 11.5 (H) 4.0 - 10.5 K/uL   RBC 3.14 (L) 3.87 - 5.11 MIL/uL  Hemoglobin 10.4 (L) 12.0 - 15.0 g/dL   HCT 40.9 (L) 81.1 - 91.4 %   MCV 105.4 (H) 80.0 - 100.0 fL   MCH 33.1 26.0 - 34.0 pg   MCHC 31.4 30.0 - 36.0 g/dL   RDW 78.2 95.6 - 21.3 %   Platelets 262 150 - 400 K/uL   nRBC 0.0 0.0 - 0.2 %   Neutrophils Relative % 80 %   Neutro Abs 9.2 (H) 1.7 - 7.7 K/uL   Lymphocytes Relative 12 %   Lymphs Abs 1.3 0.7 - 4.0 K/uL   Monocytes Relative 6 %   Monocytes Absolute 0.6 0.1 - 1.0 K/uL   Eosinophils Relative 2 %   Eosinophils Absolute 0.2 0.0 - 0.5 K/uL   Basophils Relative 0 %   Basophils Absolute 0.0 0.0 - 0.1 K/uL   Immature Granulocytes 0 %   Abs Immature Granulocytes 0.03 0.00 - 0.07 K/uL    Comment: Performed at [facility], [address], [place], [place] [address]   *Note: Due to a large number of results and/or encounters for the requested time period, some results have not been displayed. A complete set of results can be found in Results Review.   CT Maxillofacial W Contrast  Result Date: 01/24/2023 CLINICAL DATA:  62 year old female with possible maxillary dental abscess. EXAM: CT MAXILLOFACIAL WITH CONTRAST TECHNIQUE: Multidetector CT imaging of the maxillofacial structures was performed with intravenous contrast. Multiplanar CT image reconstructions were also generated. RADIATION DOSE REDUCTION: This exam was performed according to the departmental dose-optimization program which includes automated exposure control, adjustment of the mA and/or kV according to patient size and/or use of iterative reconstruction technique. CONTRAST:  75mL OMNIPAQUE IOHEXOL 350 MG/ML SOLN COMPARISON:  Neck CT 12/05/2022. FINDINGS: Osseous: Mandible intact and normally located. Previous mandible tooth extractions but no mandible acute dental finding. Right maxillary anterior molar dental caries and periapical lucency. And  cortical dehiscence at the lateral root of that tooth on series 5, image 29. See soft tissue findings below. Elsewhere the bilateral maxilla, zygoma, pterygoid, and nasal bones appear intact. Central skull base intact. Visible cervical vertebrae appear intact and aligned. Orbits: No orbital wall fracture. Bilateral orbits soft tissues appears symmetric, negative. Sinuses: Visualized paranasal sinuses and mastoids are stable and well aerated. Tympanic cavities are clear. Soft tissues: Visible thyroid, larynx, pharynx, parapharyngeal spaces, retropharyngeal space, submandibular, sublingual, and parotid spaces are within normal limits. There is soft tissue inflammation in the right buccal space surrounding the anterior right maxillary molar (series 7, image 30) and on the same image a medial subperiosteal abscess affecting the gums is visible and rim enhancing. See also series 3, image 32, 33. Rim enhancing medial fluid collection there is approximately 10 x 4 x 6 mm, although may track posteriorly as seen on image 33. No soft tissue gas. No periosteal abscess visible along the lateral margin of the tooth. The right masticator space is relatively spared. Small, enhancing and reactive appearing right level 1 and level 2 lymph nodes. Limited intracranial: Major vascular structures in the visible face and at the skull base are enhancing and appear patent including the cavernous sinus. Negative visible brain parenchyma. IMPRESSION: 1. Positive for carious right maxillary anterior molar dental caries and periapical dehiscence. Surrounding soft tissue inflammation, and medial gumline Subperiosteal Abscess (1 cm series 3, image 32) which might track further posteriorly (image 33). 2. Reactive right level 1 and level 2 lymph nodes. No other complicating features. Electronically Signed   By:  Odessa Fleming M.D.   On: 01/24/2023 04:12   DG Chest 2 View  Result Date: 01/23/2023 CLINICAL DATA:  Shortness of breath and palpitations  EXAM: CHEST - 2 VIEW COMPARISON:  Radiographs 12/05/2022 FINDINGS: Stable cardiomegaly. Cardiac valve prostheses. Left basilar scarring or atelectasis. No focal pneumonia, pleural effusion, or pneumothorax. No displaced rib fractures. IMPRESSION: No active cardiopulmonary disease.  Cardiomegaly. Electronically Signed   By: Minerva Fester M.D.   On: 01/23/2023 23:45    Pending Labs Unresulted Labs (From admission, onward)     Start     Ordered   01/25/23 0500  Basic metabolic panel  Tomorrow morning,   R        01/24/23 1014   01/25/23 0500  CBC  Tomorrow morning,   R        01/24/23 1014   01/25/23 0500  Protime-INR  Tomorrow morning,   R        01/24/23 1014   01/24/23 1014  Comprehensive metabolic panel  Once,   R        01/24/23 1014   01/24/23 1014  Protime-INR  Once,   R        01/24/23 1014   01/24/23 1013  HIV Antibody (routine testing w rflx)  (HIV Antibody (Routine testing w reflex) panel)  Once,   R        01/24/23 1014            Vitals/Pain Today's Vitals   01/24/23 0859 01/24/23 0930 01/24/23 1145 01/24/23 1147  BP:  138/85    Pulse:  (!) 108    Resp:  16    Temp:   98 F (36.7 C)   TempSrc:      SpO2:  100%    Weight:      Height:      PainSc: 0-No pain   0-No pain    Isolation Precautions No active isolations  Medications Medications  fentaNYL (SUBLIMAZE) injection 50 mcg (50 mcg Intravenous Given 01/24/23 0828)  acetaminophen (TYLENOL) tablet 1,000 mg (1,000 mg Oral Given 01/24/23 1041)  pantoprazole (PROTONIX) EC tablet 40 mg (40 mg Oral Given 01/24/23 1042)  gabapentin (NEURONTIN) capsule 300 mg (300 mg Oral Given 01/24/23 1041)  latanoprost (XALATAN) 0.005 % ophthalmic solution 1 drop (has no administration in time range)  sodium chloride flush (NS) 0.9 % injection 3 mL (3 mLs Intravenous Given 01/24/23 1058)  lactated ringers infusion ( Intravenous New Bag/Given 01/24/23 1058)  docusate sodium (COLACE) capsule 100 mg (100 mg Oral Given 01/24/23 1041)   polyethylene glycol (MIRALAX / GLYCOLAX) packet 17 g (has no administration in time range)  bisacodyl (DULCOLAX) EC tablet 5 mg (has no administration in time range)  hydrALAZINE (APRESOLINE) injection 5 mg (has no administration in time range)  Ampicillin-Sulbactam (UNASYN) 3 g in sodium chloride 0.9 % 100 mL IVPB (3 g Intravenous New Bag/Given 01/24/23 1210)  fentaNYL (SUBLIMAZE) injection 25 mcg (25 mcg Intravenous Given 01/24/23 0339)  sodium chloride 0.9 % bolus 500 mL (0 mLs Intravenous Stopped 01/24/23 0541)  iohexol (OMNIPAQUE) 350 MG/ML injection 75 mL (75 mLs Intravenous Contrast Given 01/24/23 0351)  Ampicillin-Sulbactam (UNASYN) 3 g in sodium chloride 0.9 % 100 mL IVPB (0 g Intravenous Stopped 01/24/23 0559)  sodium chloride 0.9 % bolus 1,000 mL (0 mLs Intravenous Stopped 01/24/23 0824)    Mobility walks     Focused Assessments Cardiac Assessment Handoff:  Cardiac Rhythm: Sinus tachycardia Lab Results  Component Value Date  CKTOTAL 35 (L) 01/30/2021   CKMB 1.0 03/15/2013   TROPONINI <0.03 11/29/2017   Lab Results  Component Value Date   DDIMER 0.55 (H) 12/19/2016   Does the Patient currently have chest pain? No    R Recommendations: See Admitting Provider Note  Report given to:   Additional Notes:

## 2023-01-24 NOTE — Progress Notes (Signed)
  Transition of Care [facility]) Screening Note   Patient Details  Name: Mary [name] Date of Birth: 01/31/1961   Transition of Care [facility]) CM/SW Contact:    Leone Haven, RN Phone Number: 01/24/2023, 12:32 PM    Transition of Care Department [facility]) has reviewed patient , from home , here with elevated INR, N/V, has PCP and insurance on file.  We will continue to monitor patient advancement through interdisciplinary progression rounds. If new patient transition needs arise, please place a TOC consult.

## 2023-01-25 ENCOUNTER — Other Ambulatory Visit (HOSPITAL_COMMUNITY): Payer: Self-pay

## 2023-01-25 DIAGNOSIS — K047 Periapical abscess without sinus: Secondary | ICD-10-CM | POA: Diagnosis not present

## 2023-01-25 DIAGNOSIS — R791 Abnormal coagulation profile: Secondary | ICD-10-CM | POA: Diagnosis not present

## 2023-01-25 LAB — BASIC METABOLIC PANEL
Anion gap: 4 — ABNORMAL LOW (ref 5–15)
BUN: 9 mg/dL (ref 6–20)
CO2: 22 mmol/L (ref 22–32)
Calcium: 8.4 mg/dL — ABNORMAL LOW (ref 8.9–10.3)
Chloride: 112 mmol/L — ABNORMAL HIGH (ref 98–111)
Creatinine, Ser: 1.01 mg/dL — ABNORMAL HIGH (ref 0.44–1.00)
GFR, Estimated: >60 mL/min (ref >60)
Glucose, Bld: 79 mg/dL (ref 70–99)
Potassium: 3.9 mmol/L (ref 3.5–5.1)
Sodium: 138 mmol/L (ref 135–145)

## 2023-01-25 LAB — CBC
HCT: 31.3 % — ABNORMAL LOW (ref 36.0–46.0)
Hemoglobin: 9.8 g/dL — ABNORMAL LOW (ref 12.0–15.0)
MCH: 33 pg (ref 26.0–34.0)
MCHC: 31.3 g/dL (ref 30.0–36.0)
MCV: 105.4 fL — ABNORMAL HIGH (ref 80.0–100.0)
Platelets: 260 10*3/uL (ref 150–400)
RBC: 2.97 MIL/uL — ABNORMAL LOW (ref 3.87–5.11)
RDW: 13.5 % (ref 11.5–15.5)
WBC: 6.7 10*3/uL (ref 4.0–10.5)
nRBC: 0 % (ref 0.0–0.2)

## 2023-01-25 LAB — PROTIME-INR
INR: 2.7 — ABNORMAL HIGH (ref 0.8–1.2)
Prothrombin Time: 28.8 seconds — ABNORMAL HIGH (ref 11.4–15.2)

## 2023-01-25 MED ORDER — AMOXICILLIN-POT CLAVULANATE 875-125 MG PO TABS
1.0000 | ORAL_TABLET | Freq: Two times a day (BID) | ORAL | 0 refills | Status: AC
  Filled 2023-01-25: qty 10, 5d supply, fill #0

## 2023-01-25 MED ORDER — WARFARIN - PHARMACIST DOSING INPATIENT: Freq: Every day | Status: DC

## 2023-01-25 MED ORDER — WARFARIN SODIUM 5 MG PO TABS
10.0000 mg | ORAL_TABLET | Freq: Once | ORAL | Status: AC
  Administered 2023-01-25: 10 mg via ORAL
  Filled 2023-01-25: qty 2

## 2023-01-25 MED ORDER — ENSURE ENLIVE PO LIQD
237.0000 mL | Freq: Two times a day (BID) | ORAL | Status: DC
  Administered 2023-01-25: 237 mL via ORAL

## 2023-01-25 NOTE — Progress Notes (Signed)
Initial Nutrition Assessment  DOCUMENTATION CODES:   Not applicable  INTERVENTION:   Change to Dysphagia 3 diet (Mechanical Soft)  Ensure Enlive po BID, each supplement provides 350 kcal and 20 grams of protein.   NUTRITION DIAGNOSIS:   Inadequate oral intake related to  (mouth pain) as evidenced by meal completion < 50%, per patient/family report.  GOAL:   Patient will meet greater than or equal to 90% of their needs  MONITOR:   PO intake, Supplement acceptance  REASON FOR ASSESSMENT:   Consult  (nutrition goals)  ASSESSMENT:   Pt with PMH of alcoholism (no ETOH use currently), B12 deficiency, marijuana abuse, CHF, HTN, mechanical MV on Coumadin, and CVA who recently had dental pain due to infected R molar admitted due to supra therapeutic INR and dental caries with subperiosteal abscess.   Oral surgeon/dentist consult pending Pt ordered a surgical soft diet.  Per report PO intake decreased PTA due to mouth pain. Per RN pt's appetite is currently decreased. Only had grits this am.   Medications reviewed and include: colace, protonix  Labs reviewed:  BNP 168 INR 2.7    NUTRITION - FOCUSED PHYSICAL EXAM:  Deferred   Diet Order:   Diet Order             DIET DYS 3 Room service appropriate? Yes with Assist; Fluid consistency: Thin  Diet effective now                   EDUCATION NEEDS:   Not appropriate for education at this time  Skin:  Skin Assessment: Reviewed RN Assessment  Last BM:  4/30  Height:   Ht Readings from Last 1 Encounters:  01/23/23 5\' 2"  (1.575 m)    Weight:   Wt Readings from Last 1 Encounters:  01/25/23 68 kg    BMI:  Body mass index is 27.42 kg/m.  Estimated Nutritional Needs:   Kcal:  1600-1800  Protein:  85-100 grams  Fluid:  > 1.6 L/day  Cammy Copa., RD, LDN, CNSC See AMiON for contact information

## 2023-01-25 NOTE — TOC Initial Note (Addendum)
Transition of Care [facility]) - Initial/Assessment Note    Patient Details  Name: Mary [name] MRN: [id number] Date of Birth: 1961-06-17  Transition of Care [facility]) CM/SW Contact:    Leone Haven, RN Phone Number: 01/25/2023, 10:59 AM  Clinical Narrative:                 Patient is from home alone, she uses a cane at home. She states she will need transportation assistance at dc, NCM will assist her with a cab voucher.  Patient also states she wants to have HHPT at home , NCM notified MD and offered choice to patient, she states she has no preference.  NCM made referral to [facility] with [facility] , she is able to take the referral, soc will begin 24 to 48 hrs post dc.  Patient has a Network engineer on file already.   Expected Discharge Plan: Home w Home Health Services Barriers to Discharge: No Barriers Identified   Patient Goals and CMS Choice Patient states their goals for this hospitalization and ongoing recovery are:: return home CMS Medicare.gov Compare Post Acute Care list provided to:: Patient Choice offered to / list presented to : Patient      Expected Discharge Plan and Services In-house Referral: NA Discharge Planning Services: CM Consult Post Acute Care Choice: Home Health Living arrangements for the past 2 months: Single Family Home Expected Discharge Date: 01/25/23               DME Arranged: N/A DME Agency: NA       HH Arranged: PT HH Agency: [facility] Date HH Agency Contacted: 01/25/23 Time HH Agency Contacted: 1058 Representative spoke with at [facility] Agency: Tresa Endo  Prior Living Arrangements/Services Living arrangements for the past 2 months: Single Family Home Lives with:: Self Patient language and need for interpreter reviewed:: Yes Do you feel safe going back to the place where you live?: Yes      Need for Family Participation in Patient Care: No (Comment) Care giver support system in place?: No (comment) Current home services: DME  (cane) Criminal Activity/Legal Involvement Pertinent to Current Situation/Hospitalization: No - Comment as needed  Activities of Daily Living Home Assistive Devices/Equipment: Cane (specify quad or straight), Walker (specify type) ADL Screening (condition at time of admission) Patient's cognitive ability adequate to safely complete daily activities?: Yes Is the patient deaf or have difficulty hearing?: No Does the patient have difficulty seeing, even when wearing glasses/contacts?: No Does the patient have difficulty concentrating, remembering, or making decisions?: No Patient able to express need for assistance with ADLs?: Yes Does the patient have difficulty dressing or bathing?: No Independently performs ADLs?: Yes (appropriate for developmental age) Does the patient have difficulty walking or climbing stairs?: No Weakness of Legs: None Weakness of Arms/Hands: None  Permission Sought/Granted                  Emotional Assessment   Attitude/Demeanor/Rapport: Engaged Affect (typically observed): Appropriate Orientation: : Oriented to Self, Oriented to Place, Oriented to  Time, Oriented to Situation Alcohol / Substance Use: Not Applicable Psych Involvement: No (comment)  Admission diagnosis:  Dental abscess [K04.7] Supratherapeutic INR [R79.1] 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   Pharyngitis 12/06/2022   Neuropathy 12/06/2022   Tachycardia 12/05/2022   Supratherapeutic INR 12/05/2022   Hyperkalemia 12/05/2022   Hypotension due to hypovolemia 07/19/2022   Prolonged QT interval 07/19/2022  Marijuana user 12/24/2020   LFT elevation    Acquired pyloric stenosis    GI bleed 11/24/2020   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   Thiamin deficiency 09/01/2020   Paresthesia    Vitamin B12 deficiency 05/04/2020   Vomiting 04/24/2020   GERD (gastroesophageal reflux  disease)    Marijuana abuse    Macrocytic anemia 04/04/2019   Intermittent complete heart block (HCC)    Syncope 03/28/2019   Orthostatic hypotension 01/11/2017   Elevated liver enzymes 01/11/2017   Atypical chest pain 06/05/2016   Acute renal failure superimposed on stage 3a chronic kidney disease (HCC) 01/10/2016   Elevated INR 01/10/2016   TIA (transient ischemic attack) 09/13/2015   CVA (cerebral vascular accident) (HCC) 09/12/2015   SOB (shortness of breath) 02/24/2014   First degree heart block 06/17/2013   (HFpEF) heart failure with preserved ejection fraction (HCC) 06/16/2013   History of bacterial endocarditis 06/16/2013   Warfarin anticoagulation 06/10/2013   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   Femoral hernia 05/23/2013   Mitral valve insufficiency 04/22/2013   Tricuspid regurgitation 04/22/2013   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   SVT (supraventricular tachycardia) (HCC) 03/16/2013   PCP:  Marcine Matar, MD Pharmacy:   [facility] [address] [place] [address] [place] [address] Phone: [phone] Fax: [phone]  [facility] [address] [place] [address] Phone: [phone] Fax: [phone]  [facility] [address] [place] [address] [place] [address] Phone: [phone] Fax: [phone]     Social Determinants of Health (SDOH) Social History: SDOH Screenings   Food Insecurity: No Food Insecurity (01/24/2023)  Housing: Medium Risk (01/24/2023)  Transportation Needs: No Transportation Needs (01/24/2023)  Utilities: Not At Risk (01/24/2023)  Depression (PHQ2-9): Medium Risk (06/28/2022)  Tobacco Use: Medium Risk (01/24/2023)   SDOH Interventions:     Readmission Risk Interventions    12/02/2020   10:34 AM  Readmission Risk Prevention Plan  Transportation  Screening Complete  PCP or Specialist appointment within 3-5 days of discharge Not Complete  PCP/Specialist Appt Not Complete comments First available appt was already scheduled on 4/7.  HRI or Home Care Consult Complete  SW Recovery Care/Counseling Consult Complete  Palliative Care Screening Not Applicable  Skilled Nursing Facility Not Applicable

## 2023-01-25 NOTE — Discharge Summary (Signed)
Physician Discharge Summary   Patient: Mary [name] MRN: [id number] DOB: [birth year]  Admit date:     01/23/2023  Discharge date: 01/25/23  Discharge Physician: Rickey Barbara   PCP: Marcine Matar, MD   Recommendations at discharge:    Follow up with PCP in 1-2 weeks  Follow up with coumadin clinic as scheduled Follow up with Dr. Chales Salmon as scheduled   Discharge Diagnoses: Principal Problem:   Supratherapeutic INR Active Problems:   S/P mitral valve replacement with metallic valve   (HFpEF) heart failure with preserved ejection fraction (HCC)   Neuropathy   Glaucoma (increased eye pressure)  Resolved Problems:   * No resolved hospital problems. *  Hospital Course: 62 y.o. female with medical history significant of chronic diastolic CHF, mechanical mitral valve on Coumadin, and CVA who was admitted last month for dental pain secondary to infected R molar.  She returned yesterday after cardiology appointment showed her INR was 6.7.  She went to get her INR checked "and it was sky high."  She went home and started feeling drowsy.  She was told if she felt different than usual to come to the ER so she did.  They told her not to take Coumadin for 4 days.  She has been having dental pain that started April 1.  She was admitted to the hospital and sent home with everything she needed but she was unable to get scheduled until today - was scheduled for surgery today with oral surgeon.  She has been having more problems with the tooth.   Pt was observed for supertherapeutic INR  Assessment and Plan: Supratherapeutic INR, mechanical valve -She was seen in cardiology clinic yesterday and found to have supratherapeutic INR, with confirmed INR 6.7 -Improving with holding Coumadin, 4.7 -> 4.2. -No active bleeding -Pharmacy to dose INR order previously placed and so they can assist with management of this issue. -Goal INR is 2.5-3.5 due to mechanical valve -INR trended down to 2.7.  Discussed with pharmacy to continue outpatient dosing   Dental caries with subperiosteal abscess -Prior admission for dental pain, abscess -She was treated with abx and was planned for dental extraction today but this was canceled due to elevated INR -discussed with Dr. Chales Salmon. Office to rescheduled oral procedure. Per Oral Surgeon, OK to continue coumadin with goal INR less than 3.0 -prescribed augmentin on d/c -Pharmacy to follow up with coumadin checks as outpatient   Chronic diastolic CHF -3/14 echo with low normal EF, 50-55%, indeterminate diastolic parameters -No current apparent CHF symptoms, euvolemic   Neuropathy -Continue gabapentin   Glaucoma -Continue latanoprost   Marijuana abuse -Cessation encouraged; this should be encouraged on an ongoing basis       Consultants: Discussed with Dr. Chales Salmon Procedures performed:   Disposition: Home Diet recommendation:  Cardiac diet DISCHARGE MEDICATION: Allergies as of 01/25/2023       Reactions   Ibuprofen Itching   Oxycodone Itching   Pt states no longer makes her itchy   Tape Itching, Rash, Other (See Comments)   THE ONLY TAPE THAT IS TOLERATED IS PAPER TAPE. EKG leads inflame the skin        Medication List     TAKE these medications    acetaminophen 500 MG tablet Commonly known as: TYLENOL Take 1,000 mg by mouth in the morning, at noon, and at bedtime.   amoxicillin-clavulanate 875-125 MG tablet Commonly known as: AUGMENTIN Take 1 tablet by mouth 2 (two) times daily for 5 days.  folic acid 1 MG tablet Commonly known as: FOLVITE Take 1 tablet by mouth once daily   gabapentin 300 MG capsule Commonly known as: NEURONTIN TAKE 1 CAPSULE BY MOUTH THREE TIMES DAILY   latanoprost 0.005 % ophthalmic solution Commonly known as: XALATAN Place 1 drop into both eyes at bedtime.   pantoprazole 40 MG tablet Commonly known as: PROTONIX Take 1 tablet (40 mg total) by mouth daily.   thiamine 100 MG  tablet Commonly known as: VITAMIN B1 Take 1 tablet (100 mg total) by mouth daily.   traMADol 50 MG tablet Commonly known as: ULTRAM Take 1 tablet (50 mg total) by mouth at bedtime as needed.   warfarin 5 MG tablet Commonly known as: COUMADIN Take as directed. If you are unsure how to take this medication, talk to your nurse or doctor. Original instructions: TAKE 5MG  TO 7.5MG  BY MOUTH DAILY AS DIRECTED BY COUMADIN CLINIC What changed:  how much to take how to take this when to take this additional instructions Another medication with the same name was removed. Continue taking this medication, and follow the directions you see here.        Follow-up Information     Dutch Quint, MD Follow up.   Specialty: Oral Surgery Why: Hospital follow up as scheduled Contact information: [address] [place] [address] [phone]         Marcine Matar, MD Follow up in 2 week(s).   Specialty: Internal Medicine Why: Hospital follow up Contact information: [address] [place] [address] [phone]                Discharge Exam: Ceasar Mons Weights   01/23/23 2253 01/25/23 0104  Weight: 68.5 kg 68 kg   General exam: Awake, laying in bed, in nad Respiratory system: Normal respiratory effort, no wheezing Cardiovascular system: regular rate, s1, s2 Gastrointestinal system: Soft, nondistended, positive BS Central nervous system: CN2-12 grossly intact, strength intact Extremities: Perfused, no clubbing Skin: Normal skin turgor, no notable skin lesions seen Psychiatry: Mood normal // no visual hallucinations   Condition at discharge: fair  The results of significant diagnostics from this hospitalization (including imaging, microbiology, ancillary and laboratory) are listed below for reference.   Imaging Studies: CT Maxillofacial W Contrast  Result Date: 01/24/2023 CLINICAL DATA:  62 year old female with possible maxillary dental abscess.  EXAM: CT MAXILLOFACIAL WITH CONTRAST TECHNIQUE: Multidetector CT imaging of the maxillofacial structures was performed with intravenous contrast. Multiplanar CT image reconstructions were also generated. RADIATION DOSE REDUCTION: This exam was performed according to the departmental dose-optimization program which includes automated exposure control, adjustment of the mA and/or kV according to patient size and/or use of iterative reconstruction technique. CONTRAST:  75mL OMNIPAQUE IOHEXOL 350 MG/ML SOLN COMPARISON:  Neck CT 12/05/2022. FINDINGS: Osseous: Mandible intact and normally located. Previous mandible tooth extractions but no mandible acute dental finding. Right maxillary anterior molar dental caries and periapical lucency. And cortical dehiscence at the lateral root of that tooth on series 5, image 29. See soft tissue findings below. Elsewhere the bilateral maxilla, zygoma, pterygoid, and nasal bones appear intact. Central skull base intact. Visible cervical vertebrae appear intact and aligned. Orbits: No orbital wall fracture. Bilateral orbits soft tissues appears symmetric, negative. Sinuses: Visualized paranasal sinuses and mastoids are stable and well aerated. Tympanic cavities are clear. Soft tissues: Visible thyroid, larynx, pharynx, parapharyngeal spaces, retropharyngeal space, submandibular, sublingual, and parotid spaces are within normal limits. There is soft tissue inflammation in the right  buccal space surrounding the anterior right maxillary molar (series 7, image 30) and on the same image a medial subperiosteal abscess affecting the gums is visible and rim enhancing. See also series 3, image 32, 33. Rim enhancing medial fluid collection there is approximately 10 x 4 x 6 mm, although may track posteriorly as seen on image 33. No soft tissue gas. No periosteal abscess visible along the lateral margin of the tooth. The right masticator space is relatively spared. Small, enhancing and reactive  appearing right level 1 and level 2 lymph nodes. Limited intracranial: Major vascular structures in the visible face and at the skull base are enhancing and appear patent including the cavernous sinus. Negative visible brain parenchyma. IMPRESSION: 1. Positive for carious right maxillary anterior molar dental caries and periapical dehiscence. Surrounding soft tissue inflammation, and medial gumline Subperiosteal Abscess (1 cm series 3, image 32) which might track further posteriorly (image 33). 2. Reactive right level 1 and level 2 lymph nodes. No other complicating features. Electronically Signed   By: Odessa Fleming M.D.   On: 01/24/2023 04:12   DG Chest 2 View  Result Date: 01/23/2023 CLINICAL DATA:  Shortness of breath and palpitations EXAM: CHEST - 2 VIEW COMPARISON:  Radiographs 12/05/2022 FINDINGS: Stable cardiomegaly. Cardiac valve prostheses. Left basilar scarring or atelectasis. No focal pneumonia, pleural effusion, or pneumothorax. No displaced rib fractures. IMPRESSION: No active cardiopulmonary disease.  Cardiomegaly. Electronically Signed   By: Minerva Fester M.D.   On: 01/23/2023 23:45    Microbiology: Results for orders placed or performed during the hospital encounter of 12/05/22  Resp panel by RT-PCR (RSV, Flu A&B, Covid) Anterior Nasal Swab     Status: None   Collection Time: 12/05/22 11:15 AM   Specimen: Anterior Nasal Swab  Result Value Ref Range Status   SARS Coronavirus 2 by RT PCR NEGATIVE NEGATIVE Final   Influenza A by PCR NEGATIVE NEGATIVE Final   Influenza B by PCR NEGATIVE NEGATIVE Final    Comment: (NOTE) The Xpert Xpress SARS-CoV-2/FLU/RSV plus assay is intended as an aid in the diagnosis of influenza from Nasopharyngeal swab specimens and should not be used as a sole basis for treatment. Nasal washings and aspirates are unacceptable for Xpert Xpress SARS-CoV-2/FLU/RSV testing.  Fact Sheet for Patients: BloggerCourse.com  Fact Sheet for  Healthcare Providers: SeriousBroker.it  This test is not yet approved or cleared by the [place] FDA and has been authorized for detection and/or diagnosis of SARS-CoV-2 by FDA under an Emergency Use Authorization (EUA). This EUA will remain in effect (meaning this test can be used) for the duration of the COVID-19 declaration under Section 564(b)(1) of the Act, 21 U.S.C. section 360bbb-3(b)(1), unless the authorization is terminated or revoked.     Resp Syncytial Virus by PCR NEGATIVE NEGATIVE Final    Comment: (NOTE) Fact Sheet for Patients: BloggerCourse.com  Fact Sheet for Healthcare Providers: SeriousBroker.it  This test is not yet approved or cleared by the [place] FDA and has been authorized for detection and/or diagnosis of SARS-CoV-2 by FDA under an Emergency Use Authorization (EUA). This EUA will remain in effect (meaning this test can be used) for the duration of the COVID-19 declaration under Section 564(b)(1) of the Act, 21 U.S.C. section 360bbb-3(b)(1), unless the authorization is terminated or revoked.  Performed at [facility], [address], [place], [place] [address]   Group A Strep by PCR     Status: None   Collection Time: 12/05/22 11:15 AM  Specimen: Anterior Nasal Swab; Sterile Swab  Result Value Ref Range Status   Group A Strep by PCR NOT DETECTED NOT DETECTED Final    Comment: Performed at [facility], [address] [place], [place] [address]  Respiratory (~20 pathogens) panel by PCR     Status: None   Collection Time: 12/06/22  1:14 AM   Specimen: Nasopharyngeal Swab; Respiratory  Result Value Ref Range Status   Adenovirus NOT DETECTED NOT DETECTED Final   Coronavirus 229E NOT DETECTED NOT DETECTED Final    Comment: (NOTE) The Coronavirus on the Respiratory Panel, DOES NOT test for the novel  Coronavirus (2019 nCoV)    Coronavirus HKU1 NOT  DETECTED NOT DETECTED Final   Coronavirus NL63 NOT DETECTED NOT DETECTED Final   Coronavirus OC43 NOT DETECTED NOT DETECTED Final   Metapneumovirus NOT DETECTED NOT DETECTED Final   Rhinovirus / Enterovirus NOT DETECTED NOT DETECTED Final   Influenza A NOT DETECTED NOT DETECTED Final   Influenza B NOT DETECTED NOT DETECTED Final   Parainfluenza Virus 1 NOT DETECTED NOT DETECTED Final   Parainfluenza Virus 2 NOT DETECTED NOT DETECTED Final   Parainfluenza Virus 3 NOT DETECTED NOT DETECTED Final   Parainfluenza Virus 4 NOT DETECTED NOT DETECTED Final   Respiratory Syncytial Virus NOT DETECTED NOT DETECTED Final   Bordetella pertussis NOT DETECTED NOT DETECTED Final   Bordetella Parapertussis NOT DETECTED NOT DETECTED Final   Chlamydophila pneumoniae NOT DETECTED NOT DETECTED Final   Mycoplasma pneumoniae NOT DETECTED NOT DETECTED Final    Comment: Performed at [facility], [address] [place], [place] [address]  Culture, blood (Routine X 2) w Reflex to ID Panel     Status: None   Collection Time: 12/08/22  7:57 AM   Specimen: BLOOD RIGHT HAND  Result Value Ref Range Status   Specimen Description BLOOD RIGHT HAND  Final   Special Requests   Final    BOTTLES DRAWN AEROBIC AND ANAEROBIC Blood Culture results may not be optimal due to an inadequate volume of blood received in culture bottles   Culture   Final    NO GROWTH 5 DAYS Performed at [facility], [address], [place], [place] [address]    Report Status 12/13/2022 FINAL  Final  Culture, blood (Routine X 2) w Reflex to ID Panel     Status: None   Collection Time: 12/08/22  8:03 AM   Specimen: BLOOD LEFT HAND  Result Value Ref Range Status   Specimen Description BLOOD LEFT HAND  Final   Special Requests   Final    BOTTLES DRAWN AEROBIC ONLY Blood Culture results may not be optimal due to an inadequate volume of blood received in culture bottles   Culture   Final    NO GROWTH 5 DAYS Performed at [facility], [address] [place], [place] [address]    Report Status 12/13/2022 FINAL  Final   *Note: Due to a large number of results and/or encounters for the requested time period, some results have not been displayed. A complete set of results can be found in Results Review.    Labs: CBC: Recent Labs  Lab 01/24/23 0001 01/24/23 1013 01/25/23 0722  WBC 12.1* 11.5* 6.7  NEUTROABS  --  9.2*  --   HGB 12.1 10.4* 9.8*  HCT 37.6 33.1* 31.3*  MCV 104.4* 105.4* 105.4*  PLT 324 262 260   Basic Metabolic Panel: Recent Labs  Lab  01/24/23 0001 01/24/23 1245 01/25/23 0722  NA 136 138 138  K 4.2 4.3 3.9  CL 105 111 112*  CO2 20* 21* 22  GLUCOSE 104* 110* 79  BUN 17 12 9   CREATININE 1.17* 1.15* 1.01*  CALCIUM 9.2 8.2* 8.4*   Liver Function Tests: Recent Labs  Lab 01/24/23 1245  AST 20  ALT 10  ALKPHOS 83  BILITOT 0.5  PROT 6.4*  ALBUMIN 3.1*   CBG: No results for input(s): "GLUCAP" in the last 168 hours.  Discharge time spent: less than 30 minutes.  Signed: Rickey Barbara, MD [facility] 01/25/2023

## 2023-01-25 NOTE — Hospital Course (Addendum)
62 y.o. female with medical history significant of chronic diastolic CHF, mechanical mitral valve on Coumadin, and CVA who was admitted last month for dental pain secondary to infected R molar.  She returned yesterday after cardiology appointment showed her INR was 6.7.  She went to get her INR checked "and it was sky high."  She went home and started feeling drowsy.  She was told if she felt different than usual to come to the ER so she did.  They told her not to take Coumadin for 4 days.  She has been having dental pain that started April 1.  She was admitted to the hospital and sent home with everything she needed but she was unable to get scheduled until today - was scheduled for surgery today with oral surgeon.  She has been having more problems with the tooth.   Pt was observed for supertherapeutic INR

## 2023-01-25 NOTE — Progress Notes (Signed)
ANTICOAGULATION CONSULT NOTE  Pharmacy Consult for warfarin Indication:  mechanical MVR   Assessment: 61 yof on warfarin PTA for mechanical MVR with recent dental abscess presenting (originally scheduled for oral surgery on day of admission) with increased drowsiness. INR has been supratherapeutic recently, 6.7 at most recent outpatient visit 4/29. Patient told to hold warfarin x 4 days (4/29-5/2) and resume 5/3. INR down 6.7>2.7   plan for outpatient f/u with oral surgeon.  PTA warfarin dose: 7.5mg  daily except 5mg  on Sun/Thurs (last dose 4/28 PTA)  Goal of Therapy:  INR 2.5-3.5 Monitor platelets by anticoagulation protocol: Yes   Plan:  Warfarin 10mg  x1 prior to dc  Follow up with outpt CVRR and oral surgeon    Mary [name] Pharm.D. CPP, BCPS Clinical Pharmacist [phone] 01/25/2023 10:00 AM

## 2023-01-26 ENCOUNTER — Telehealth: Payer: Self-pay

## 2023-01-26 NOTE — Telephone Encounter (Signed)
Message left for [facility] with call back requested. Want to confirm that they have received the order for home health services

## 2023-01-26 NOTE — Transitions of Care (Post Inpatient/ED Visit) (Signed)
01/26/2023  Name: Mary [name] MRN: [id number] DOB: 05-22-1961  Today's TOC FU Call Status: TOC FU Call Complete Date: 01/26/23  Transition Care Management Follow-up Telephone Call Date of Discharge: 01/25/23 Discharge Facility: [facility]) Type of Discharge: Inpatient Admission Primary Inpatient Discharge Diagnosis:: supratherapeutic INR How have you been since you were released from the hospital?: Better Any questions or concerns?: No  Items Reviewed: Did you receive and understand the discharge instructions provided?: Yes Medications obtained,verified, and reconciled?: Yes (Medications Reviewed) (She said she has all of her medications and did not have any questions about the med regime.  She has an INR check tomorrow.) Any new allergies since your discharge?: No Dietary orders reviewed?: No Do you have support at home?: No People in Home: alone  Medications Reviewed Today: the patient said that she did not need to review the med list today Medications Reviewed Today     Reviewed by Larrie Kass, CPhT (Pharmacy Technician) on 01/24/23 at 0012  Med List Status: Complete   Medication Order Taking? Sig Documenting Provider Last Dose Status Informant  acetaminophen (TYLENOL) 500 MG tablet [id number] Yes Take 1,000 mg by mouth in the morning, at noon, and at bedtime. [provider] 01/23/2023 2000 Active Self  folic acid (FOLVITE) 1 MG tablet [id number] Yes Take 1 tablet by mouth once daily Marcine Matar, MD 01/23/2023 am Active Self  gabapentin (NEURONTIN) 300 MG capsule [id number] Yes TAKE 1 CAPSULE BY MOUTH THREE TIMES DAILY Marcine Matar, MD 01/23/2023 am Active Self  latanoprost (XALATAN) 0.005 % ophthalmic solution [id number] Yes Place 1 drop into both eyes at bedtime. [provider] 01/22/2023 pm Active Self  pantoprazole (PROTONIX) 40 MG tablet [id number] Yes Take 1 tablet (40 mg total) by mouth daily. Marcine Matar, MD 01/23/2023 am Active  Self  thiamine 100 MG tablet [id number] Yes Take 1 tablet (100 mg total) by mouth daily. Marcine Matar, MD 01/23/2023 am Active Self  traMADol (ULTRAM) 50 MG tablet [id number] Yes Take 1 tablet (50 mg total) by mouth at bedtime as needed. Hoy Register, MD 01/23/2023 Active Self           Med Note (WHITE, TONIA S   Tue Jan 24, 2023 12:02 AM) Patient states that it was not effective  warfarin (COUMADIN) 5 MG tablet [id number] Yes TAKE 5MG  TO 7.5MG  BY MOUTH DAILY AS DIRECTED BY COUMADIN CLINIC  Patient taking differently: Take 5-7.5 mg by mouth See admin instructions. Take 5 mg (1 tablet) every Sun, Thu and 7.5 mg (1.5 tablets) all other days as directed by coumadin clinic   Jake Bathe, MD 01/22/2023 1800 Active Self  warfarin (COUMADIN) 7.5 MG tablet [id number] No Take 1 tablet (7.5 mg total) by mouth every Monday,Wednesday,Friday, and Sunday at 6 PM.  Patient not taking: Reported on 01/24/2023   Dorcas Carrow, MD Not Taking Active Self            Home Care and Equipment/Supplies: Were Home Health Services Ordered?: Yes Name of Home Health Agency:: [facility] Has Agency set up a time to come to your home?: No EMR reviewed for Home Health Orders: Orders present/patient has not received call (refer to CM for follow-up) Any new equipment or medical supplies ordered?: No  Functional Questionnaire: Do you need assistance with bathing/showering or dressing?: No Do you need assistance with meal preparation?: No Do you need assistance with eating?: No Do you have difficulty maintaining continence: No Do you need assistance with getting  out of bed/getting out of a chair/moving?: No (has cane to use with ambulation) Do you have difficulty managing or taking your medications?: No  Follow up appointments reviewed: PCP Follow-up appointment confirmed?: Yes Date of PCP follow-up appointment?: 02/27/23 (I offered to schedule her to be seen sooner but she wanted to keep this  appointment) Follow-up Provider: [facility] Follow-up appointment confirmed?: Yes Date of Specialist follow-up appointment?: 01/27/23 Follow-Up Specialty Provider:: coumadin clinic, 02/07/2023- oral surgeon Do you need transportation to your follow-up appointment?: No Do you understand care options if your condition(s) worsen?: Yes-patient verbalized understanding    SIGNATURE  Robyne Peers, RN

## 2023-01-27 ENCOUNTER — Telehealth: Payer: Self-pay | Admitting: Internal Medicine

## 2023-01-27 ENCOUNTER — Ambulatory Visit (INDEPENDENT_AMBULATORY_CARE_PROVIDER_SITE_OTHER): Payer: Medicaid Other | Admitting: *Deleted

## 2023-01-27 DIAGNOSIS — Z7901 Long term (current) use of anticoagulants: Secondary | ICD-10-CM | POA: Diagnosis not present

## 2023-01-27 DIAGNOSIS — Z5181 Encounter for therapeutic drug level monitoring: Secondary | ICD-10-CM

## 2023-01-27 LAB — POCT INR: POC INR: 3.4

## 2023-01-27 NOTE — Telephone Encounter (Signed)
Copied from CRM [id number]. Topic: General - Other >> Jan 27, 2023 12:24 PM Turkey B wrote: Reason for CRM: Claris Che from [facility] called in states pt want to start pt on May 7 instead of this week.

## 2023-01-27 NOTE — Patient Instructions (Addendum)
Description   Continue to take warfarin 1.5 tablets daily except for 1 tablet on Sunday and Thursday.  Recheck INR in 1 week. Dental extraction on 5/14- Dentist would like INR 3 or less.  Coumadin Clinic [phone]

## 2023-01-30 ENCOUNTER — Telehealth: Payer: Self-pay | Admitting: Internal Medicine

## 2023-01-30 NOTE — Telephone Encounter (Signed)
Noted  

## 2023-01-30 NOTE — Telephone Encounter (Signed)
Called & spoke to Wheeler at [facility]. Verbal orders given to start PT on May 7th. No further questions at this time.

## 2023-01-30 NOTE — Telephone Encounter (Signed)
Kreg Shropshire [facility] PT pt cancelled today and has delayed services until friday  Best contact: [phone]

## 2023-01-31 ENCOUNTER — Ambulatory Visit: Payer: Medicaid Other

## 2023-02-03 ENCOUNTER — Ambulatory Visit: Payer: Medicaid Other

## 2023-02-03 ENCOUNTER — Telehealth: Payer: Self-pay | Admitting: Cardiology

## 2023-02-03 ENCOUNTER — Telehealth: Payer: Self-pay | Admitting: Internal Medicine

## 2023-02-03 DIAGNOSIS — E538 Deficiency of other specified B group vitamins: Secondary | ICD-10-CM | POA: Diagnosis not present

## 2023-02-03 DIAGNOSIS — I6529 Occlusion and stenosis of unspecified carotid artery: Secondary | ICD-10-CM | POA: Diagnosis not present

## 2023-02-03 DIAGNOSIS — E46 Unspecified protein-calorie malnutrition: Secondary | ICD-10-CM | POA: Diagnosis not present

## 2023-02-03 DIAGNOSIS — Z7901 Long term (current) use of anticoagulants: Secondary | ICD-10-CM | POA: Diagnosis not present

## 2023-02-03 DIAGNOSIS — H409 Unspecified glaucoma: Secondary | ICD-10-CM | POA: Diagnosis not present

## 2023-02-03 DIAGNOSIS — I442 Atrioventricular block, complete: Secondary | ICD-10-CM | POA: Diagnosis not present

## 2023-02-03 DIAGNOSIS — K029 Dental caries, unspecified: Secondary | ICD-10-CM | POA: Diagnosis not present

## 2023-02-03 DIAGNOSIS — H269 Unspecified cataract: Secondary | ICD-10-CM | POA: Diagnosis not present

## 2023-02-03 DIAGNOSIS — I5032 Chronic diastolic (congestive) heart failure: Secondary | ICD-10-CM | POA: Diagnosis not present

## 2023-02-03 DIAGNOSIS — F1210 Cannabis abuse, uncomplicated: Secondary | ICD-10-CM | POA: Diagnosis not present

## 2023-02-03 DIAGNOSIS — D539 Nutritional anemia, unspecified: Secondary | ICD-10-CM | POA: Diagnosis not present

## 2023-02-03 DIAGNOSIS — G629 Polyneuropathy, unspecified: Secondary | ICD-10-CM | POA: Diagnosis not present

## 2023-02-03 DIAGNOSIS — R791 Abnormal coagulation profile: Secondary | ICD-10-CM | POA: Diagnosis not present

## 2023-02-03 DIAGNOSIS — Z954 Presence of other heart-valve replacement: Secondary | ICD-10-CM

## 2023-02-03 DIAGNOSIS — I110 Hypertensive heart disease with heart failure: Secondary | ICD-10-CM | POA: Diagnosis not present

## 2023-02-03 DIAGNOSIS — J45909 Unspecified asthma, uncomplicated: Secondary | ICD-10-CM | POA: Diagnosis not present

## 2023-02-03 DIAGNOSIS — K219 Gastro-esophageal reflux disease without esophagitis: Secondary | ICD-10-CM | POA: Diagnosis not present

## 2023-02-03 DIAGNOSIS — Z79891 Long term (current) use of opiate analgesic: Secondary | ICD-10-CM | POA: Diagnosis not present

## 2023-02-03 DIAGNOSIS — I471 Supraventricular tachycardia, unspecified: Secondary | ICD-10-CM | POA: Diagnosis not present

## 2023-02-03 MED ORDER — WARFARIN SODIUM 5 MG PO TABS
ORAL_TABLET | ORAL | 0 refills | Status: DC
Start: 2023-02-03 — End: 2023-03-09

## 2023-02-03 NOTE — Telephone Encounter (Signed)
*  STAT* If patient is at the pharmacy, call can be transferred to refill team.   1. Which medications need to be refilled? (please list name of each medication and dose if known)   warfarin (COUMADIN) 5 MG tablet    2. Which pharmacy/location (including street and city if local pharmacy) is medication to be sent to?  [facility] - [place] ([place]), [place] - [address]    3. Do they need a 30 day or 90 day supply? 90 day   Pt is out of medication

## 2023-02-03 NOTE — Telephone Encounter (Signed)
Prescription refill request received for warfarin Lov: 06/22/2021 Next INR check: 5/14 Warfarin tablet strength: 5mg 

## 2023-02-03 NOTE — Telephone Encounter (Signed)
Verbal order given for patient. 

## 2023-02-03 NOTE — Telephone Encounter (Signed)
Home Health Verbal Orders - Caller/Agency: Darnelle Maffucci with [facility] Callback Number: [phone] Requesting PT  Frequency: 1x2wks, 2x6wks & 1x2wks  Mary [name] is requesting a call back today if possible

## 2023-02-06 ENCOUNTER — Ambulatory Visit: Payer: Medicaid Other | Attending: Cardiology

## 2023-02-06 DIAGNOSIS — Z7901 Long term (current) use of anticoagulants: Secondary | ICD-10-CM

## 2023-02-06 LAB — POCT INR: INR: 2.9 (ref 2.0–3.0)

## 2023-02-06 NOTE — Patient Instructions (Signed)
TAKE 0.5 TABLET TONIGHT ONLY THEN Continue to take warfarin 1.5 tablets daily except for 1 tablet on Sunday and Thursday.  Recheck INR in 3 weeks. Dental extraction on 5/14- Dentist would like INR 3 or less.  Coumadin Clinic [phone]

## 2023-02-07 ENCOUNTER — Ambulatory Visit: Payer: Medicaid Other

## 2023-02-09 DIAGNOSIS — R69 Illness, unspecified: Secondary | ICD-10-CM | POA: Diagnosis not present

## 2023-02-22 ENCOUNTER — Inpatient Hospital Stay: Payer: Medicaid Other | Admitting: Physician Assistant

## 2023-02-24 ENCOUNTER — Ambulatory Visit: Payer: Self-pay | Admitting: *Deleted

## 2023-02-24 DIAGNOSIS — K029 Dental caries, unspecified: Secondary | ICD-10-CM | POA: Diagnosis not present

## 2023-02-24 DIAGNOSIS — E538 Deficiency of other specified B group vitamins: Secondary | ICD-10-CM | POA: Diagnosis not present

## 2023-02-24 DIAGNOSIS — F1210 Cannabis abuse, uncomplicated: Secondary | ICD-10-CM | POA: Diagnosis not present

## 2023-02-24 DIAGNOSIS — I471 Supraventricular tachycardia, unspecified: Secondary | ICD-10-CM | POA: Diagnosis not present

## 2023-02-24 DIAGNOSIS — J45909 Unspecified asthma, uncomplicated: Secondary | ICD-10-CM | POA: Diagnosis not present

## 2023-02-24 DIAGNOSIS — I110 Hypertensive heart disease with heart failure: Secondary | ICD-10-CM | POA: Diagnosis not present

## 2023-02-24 DIAGNOSIS — Z7901 Long term (current) use of anticoagulants: Secondary | ICD-10-CM | POA: Diagnosis not present

## 2023-02-24 DIAGNOSIS — R791 Abnormal coagulation profile: Secondary | ICD-10-CM | POA: Diagnosis not present

## 2023-02-24 DIAGNOSIS — K219 Gastro-esophageal reflux disease without esophagitis: Secondary | ICD-10-CM | POA: Diagnosis not present

## 2023-02-24 DIAGNOSIS — G629 Polyneuropathy, unspecified: Secondary | ICD-10-CM | POA: Diagnosis not present

## 2023-02-24 DIAGNOSIS — E46 Unspecified protein-calorie malnutrition: Secondary | ICD-10-CM | POA: Diagnosis not present

## 2023-02-24 DIAGNOSIS — I5032 Chronic diastolic (congestive) heart failure: Secondary | ICD-10-CM | POA: Diagnosis not present

## 2023-02-24 DIAGNOSIS — I6529 Occlusion and stenosis of unspecified carotid artery: Secondary | ICD-10-CM | POA: Diagnosis not present

## 2023-02-24 DIAGNOSIS — D539 Nutritional anemia, unspecified: Secondary | ICD-10-CM | POA: Diagnosis not present

## 2023-02-24 DIAGNOSIS — Z79891 Long term (current) use of opiate analgesic: Secondary | ICD-10-CM | POA: Diagnosis not present

## 2023-02-24 DIAGNOSIS — H409 Unspecified glaucoma: Secondary | ICD-10-CM | POA: Diagnosis not present

## 2023-02-24 DIAGNOSIS — I442 Atrioventricular block, complete: Secondary | ICD-10-CM | POA: Diagnosis not present

## 2023-02-24 DIAGNOSIS — H269 Unspecified cataract: Secondary | ICD-10-CM | POA: Diagnosis not present

## 2023-02-24 NOTE — Telephone Encounter (Signed)
Spoke with patient . Verified name & DOB   Patient voiced that she has chest pressure and SOB. Voices she feels that it is getting worst. Advised patient to call 911. Patient agreeable voiced that she was calling 911 as soon as the call ended.

## 2023-02-24 NOTE — Telephone Encounter (Signed)
  Chief Complaint: difficulty breathing at rest, chest pressure. PTA, Revonda Standard with patient now  Symptoms: chest pressure, shortness of breath at rest and when talking. Noted worsening after taking shower this am. Heart rate 108 at rest s/p valve replacement 2014. Pain comes and goes.  Frequency: this am after shower Pertinent Negatives: Patient denies constant chest pain does not sound to be in resp. Distress at this time Disposition: [x] ED /[] Urgent Care (no appt availability in office) / [] Appointment(In office/virtual)/ []  [place] [facility]/ [] Home Care/ [] Refused Recommended Disposition /[] [place] [facility]/ []  Follow-up with PCP Additional Notes:   Recommended to go to ED for evaluation. OV already scheduled for 02/27/23. Patient would like to have PCP call back.     Reason for Disposition  [1] MODERATE difficulty breathing (e.g., speaks in phrases, SOB even at rest, pulse 100-120) AND [2] NEW-onset or WORSE than normal  Answer Assessment - Initial Assessment Questions 1. RESPIRATORY STATUS: "Describe your breathing?" (e.g., wheezing, shortness of breath, unable to speak, severe coughing)      Shortness of breath  2. ONSET: "When did this breathing problem begin?"      After shower this am  3. PATTERN "Does the difficult breathing come and go, or has it been constant since it started?"      Comes and goes  4. SEVERITY: "How bad is your breathing?" (e.g., mild, moderate, severe)    - MILD: No SOB at rest, mild SOB with walking, speaks normally in sentences, can lie down, no retractions, pulse < 100.    - MODERATE: SOB at rest, SOB with minimal exertion and prefers to sit, cannot lie down flat, speaks in phrases, mild retractions, audible wheezing, pulse 100-120.    - SEVERE: Very SOB at rest, speaks in single words, struggling to breathe, sitting hunched forward, retractions, pulse > 120      SOB at rest , esp with talking . Chest pressure  HR 108 5. RECURRENT SYMPTOM: "Have  you had difficulty breathing before?" If Yes, ask: "When was the last time?" and "What happened that time?"      Yes 6. CARDIAC HISTORY: "Do you have any history of heart disease?" (e.g., heart attack, angina, bypass surgery, angioplasty)      Yes hx valve replacement  7. LUNG HISTORY: "Do you have any history of lung disease?"  (e.g., pulmonary embolus, asthma, emphysema)     Na  8. CAUSE: "What do you think is causing the breathing problem?"      Not sure  9. OTHER SYMPTOMS: "Do you have any other symptoms? (e.g., dizziness, runny nose, cough, chest pain, fever)     Chest pain pressure, breathing issues with talking  10. O2 SATURATION MONITOR:  "Do you use an oxygen saturation monitor (pulse oximeter) at home?" If Yes, ask: "What is your reading (oxygen level) today?" "What is your usual oxygen saturation reading?" (e.g., 95%)       Na  11. PREGNANCY: "Is there any chance you are pregnant?" "When was your last menstrual period?"       na 12. TRAVEL: "Have you traveled out of the country in the last month?" (e.g., travel history, exposures)       na  Protocols used: Breathing Difficulty-A-AH

## 2023-02-27 ENCOUNTER — Ambulatory Visit: Payer: Medicaid Other | Admitting: Internal Medicine

## 2023-02-27 ENCOUNTER — Ambulatory Visit: Payer: Medicaid Other

## 2023-03-02 ENCOUNTER — Telehealth: Payer: Self-pay

## 2023-03-02 DIAGNOSIS — K219 Gastro-esophageal reflux disease without esophagitis: Secondary | ICD-10-CM | POA: Diagnosis not present

## 2023-03-02 DIAGNOSIS — E46 Unspecified protein-calorie malnutrition: Secondary | ICD-10-CM | POA: Diagnosis not present

## 2023-03-02 DIAGNOSIS — I442 Atrioventricular block, complete: Secondary | ICD-10-CM | POA: Diagnosis not present

## 2023-03-02 DIAGNOSIS — F1210 Cannabis abuse, uncomplicated: Secondary | ICD-10-CM | POA: Diagnosis not present

## 2023-03-02 DIAGNOSIS — J45909 Unspecified asthma, uncomplicated: Secondary | ICD-10-CM | POA: Diagnosis not present

## 2023-03-02 DIAGNOSIS — G629 Polyneuropathy, unspecified: Secondary | ICD-10-CM | POA: Diagnosis not present

## 2023-03-02 DIAGNOSIS — K029 Dental caries, unspecified: Secondary | ICD-10-CM | POA: Diagnosis not present

## 2023-03-02 DIAGNOSIS — D539 Nutritional anemia, unspecified: Secondary | ICD-10-CM | POA: Diagnosis not present

## 2023-03-02 DIAGNOSIS — I110 Hypertensive heart disease with heart failure: Secondary | ICD-10-CM | POA: Diagnosis not present

## 2023-03-02 DIAGNOSIS — H409 Unspecified glaucoma: Secondary | ICD-10-CM | POA: Diagnosis not present

## 2023-03-02 DIAGNOSIS — Z7901 Long term (current) use of anticoagulants: Secondary | ICD-10-CM | POA: Diagnosis not present

## 2023-03-02 DIAGNOSIS — I471 Supraventricular tachycardia, unspecified: Secondary | ICD-10-CM | POA: Diagnosis not present

## 2023-03-02 DIAGNOSIS — Z79891 Long term (current) use of opiate analgesic: Secondary | ICD-10-CM | POA: Diagnosis not present

## 2023-03-02 DIAGNOSIS — H269 Unspecified cataract: Secondary | ICD-10-CM | POA: Diagnosis not present

## 2023-03-02 DIAGNOSIS — E538 Deficiency of other specified B group vitamins: Secondary | ICD-10-CM | POA: Diagnosis not present

## 2023-03-02 DIAGNOSIS — I6529 Occlusion and stenosis of unspecified carotid artery: Secondary | ICD-10-CM | POA: Diagnosis not present

## 2023-03-02 DIAGNOSIS — I5032 Chronic diastolic (congestive) heart failure: Secondary | ICD-10-CM | POA: Diagnosis not present

## 2023-03-02 DIAGNOSIS — R791 Abnormal coagulation profile: Secondary | ICD-10-CM | POA: Diagnosis not present

## 2023-03-02 NOTE — Telephone Encounter (Signed)
Signed orders for home health PT faxed to [facility]

## 2023-03-03 ENCOUNTER — Telehealth: Payer: Self-pay | Admitting: *Deleted

## 2023-03-03 ENCOUNTER — Ambulatory Visit: Payer: Medicaid Other | Attending: Internal Medicine

## 2023-03-03 NOTE — Telephone Encounter (Signed)
Called pt since she missed her Anticoagulation Appt today; there was no answer therefore left a message.

## 2023-03-07 DIAGNOSIS — I110 Hypertensive heart disease with heart failure: Secondary | ICD-10-CM | POA: Diagnosis not present

## 2023-03-07 DIAGNOSIS — D539 Nutritional anemia, unspecified: Secondary | ICD-10-CM | POA: Diagnosis not present

## 2023-03-07 DIAGNOSIS — I442 Atrioventricular block, complete: Secondary | ICD-10-CM | POA: Diagnosis not present

## 2023-03-07 DIAGNOSIS — K029 Dental caries, unspecified: Secondary | ICD-10-CM | POA: Diagnosis not present

## 2023-03-07 DIAGNOSIS — F1210 Cannabis abuse, uncomplicated: Secondary | ICD-10-CM | POA: Diagnosis not present

## 2023-03-07 DIAGNOSIS — I5032 Chronic diastolic (congestive) heart failure: Secondary | ICD-10-CM | POA: Diagnosis not present

## 2023-03-07 DIAGNOSIS — K219 Gastro-esophageal reflux disease without esophagitis: Secondary | ICD-10-CM | POA: Diagnosis not present

## 2023-03-07 DIAGNOSIS — E46 Unspecified protein-calorie malnutrition: Secondary | ICD-10-CM | POA: Diagnosis not present

## 2023-03-07 DIAGNOSIS — Z7901 Long term (current) use of anticoagulants: Secondary | ICD-10-CM | POA: Diagnosis not present

## 2023-03-07 DIAGNOSIS — I471 Supraventricular tachycardia, unspecified: Secondary | ICD-10-CM | POA: Diagnosis not present

## 2023-03-07 DIAGNOSIS — I6529 Occlusion and stenosis of unspecified carotid artery: Secondary | ICD-10-CM | POA: Diagnosis not present

## 2023-03-07 DIAGNOSIS — E538 Deficiency of other specified B group vitamins: Secondary | ICD-10-CM | POA: Diagnosis not present

## 2023-03-07 DIAGNOSIS — R791 Abnormal coagulation profile: Secondary | ICD-10-CM | POA: Diagnosis not present

## 2023-03-07 DIAGNOSIS — G629 Polyneuropathy, unspecified: Secondary | ICD-10-CM | POA: Diagnosis not present

## 2023-03-07 DIAGNOSIS — H269 Unspecified cataract: Secondary | ICD-10-CM | POA: Diagnosis not present

## 2023-03-07 DIAGNOSIS — J45909 Unspecified asthma, uncomplicated: Secondary | ICD-10-CM | POA: Diagnosis not present

## 2023-03-07 DIAGNOSIS — Z79891 Long term (current) use of opiate analgesic: Secondary | ICD-10-CM | POA: Diagnosis not present

## 2023-03-07 DIAGNOSIS — H409 Unspecified glaucoma: Secondary | ICD-10-CM | POA: Diagnosis not present

## 2023-03-09 ENCOUNTER — Ambulatory Visit: Payer: Medicaid Other | Attending: Internal Medicine

## 2023-03-09 ENCOUNTER — Telehealth: Payer: Self-pay | Admitting: Cardiology

## 2023-03-09 DIAGNOSIS — Z7901 Long term (current) use of anticoagulants: Secondary | ICD-10-CM

## 2023-03-09 DIAGNOSIS — Z954 Presence of other heart-valve replacement: Secondary | ICD-10-CM

## 2023-03-09 LAB — POCT INR: INR: 4.7 — AB (ref 2.0–3.0)

## 2023-03-09 MED ORDER — WARFARIN SODIUM 5 MG PO TABS: ORAL_TABLET | ORAL | 0 refills | Status: DC

## 2023-03-09 NOTE — Patient Instructions (Signed)
Description   HOLD today's dose and drink 1 ensure and then continue to take warfarin 1.5 tablets daily except for 1 tablet on Sunday and Thursday.  Recheck INR in 2 weeks.  Coumadin Clinic [phone]

## 2023-03-09 NOTE — Telephone Encounter (Signed)
*  STAT* If patient is at the pharmacy, call can be transferred to refill team.   1. Which medications need to be refilled? (please list name of each medication and dose if known)   warfarin (COUMADIN) 5 MG tablet    2. Which pharmacy/location (including street and city if local pharmacy) is medication to be sent to?  [facility] - [place] ([place]), [place] - [address]    3. Do they need a 30 day or 90 day supply? 90

## 2023-03-09 NOTE — Telephone Encounter (Signed)
Prescription refill request received for warfarin Lov: 06/22/21 Anne Fu)  Next INR check: 03/21/23 Warfarin tablet strength: 5mg   Office visit overdue. Pt has scheduled appt with Jari Favre on 03/29/23. Pt stated she needs 90 day supply due to transportation issues to pharmacy every 30 days. Refill sent.

## 2023-03-10 DIAGNOSIS — H409 Unspecified glaucoma: Secondary | ICD-10-CM | POA: Diagnosis not present

## 2023-03-10 DIAGNOSIS — Z7901 Long term (current) use of anticoagulants: Secondary | ICD-10-CM | POA: Diagnosis not present

## 2023-03-10 DIAGNOSIS — Z79891 Long term (current) use of opiate analgesic: Secondary | ICD-10-CM | POA: Diagnosis not present

## 2023-03-10 DIAGNOSIS — I110 Hypertensive heart disease with heart failure: Secondary | ICD-10-CM | POA: Diagnosis not present

## 2023-03-10 DIAGNOSIS — I471 Supraventricular tachycardia, unspecified: Secondary | ICD-10-CM | POA: Diagnosis not present

## 2023-03-10 DIAGNOSIS — I442 Atrioventricular block, complete: Secondary | ICD-10-CM | POA: Diagnosis not present

## 2023-03-10 DIAGNOSIS — F1210 Cannabis abuse, uncomplicated: Secondary | ICD-10-CM | POA: Diagnosis not present

## 2023-03-10 DIAGNOSIS — K219 Gastro-esophageal reflux disease without esophagitis: Secondary | ICD-10-CM | POA: Diagnosis not present

## 2023-03-10 DIAGNOSIS — J45909 Unspecified asthma, uncomplicated: Secondary | ICD-10-CM | POA: Diagnosis not present

## 2023-03-10 DIAGNOSIS — H269 Unspecified cataract: Secondary | ICD-10-CM | POA: Diagnosis not present

## 2023-03-10 DIAGNOSIS — K029 Dental caries, unspecified: Secondary | ICD-10-CM | POA: Diagnosis not present

## 2023-03-10 DIAGNOSIS — E538 Deficiency of other specified B group vitamins: Secondary | ICD-10-CM | POA: Diagnosis not present

## 2023-03-10 DIAGNOSIS — D539 Nutritional anemia, unspecified: Secondary | ICD-10-CM | POA: Diagnosis not present

## 2023-03-10 DIAGNOSIS — E46 Unspecified protein-calorie malnutrition: Secondary | ICD-10-CM | POA: Diagnosis not present

## 2023-03-10 DIAGNOSIS — G629 Polyneuropathy, unspecified: Secondary | ICD-10-CM | POA: Diagnosis not present

## 2023-03-10 DIAGNOSIS — I6529 Occlusion and stenosis of unspecified carotid artery: Secondary | ICD-10-CM | POA: Diagnosis not present

## 2023-03-10 DIAGNOSIS — I5032 Chronic diastolic (congestive) heart failure: Secondary | ICD-10-CM | POA: Diagnosis not present

## 2023-03-10 DIAGNOSIS — R791 Abnormal coagulation profile: Secondary | ICD-10-CM | POA: Diagnosis not present

## 2023-03-14 ENCOUNTER — Ambulatory Visit: Payer: Medicaid Other | Admitting: Family

## 2023-03-15 DIAGNOSIS — H409 Unspecified glaucoma: Secondary | ICD-10-CM | POA: Diagnosis not present

## 2023-03-15 DIAGNOSIS — I110 Hypertensive heart disease with heart failure: Secondary | ICD-10-CM | POA: Diagnosis not present

## 2023-03-15 DIAGNOSIS — Z79891 Long term (current) use of opiate analgesic: Secondary | ICD-10-CM | POA: Diagnosis not present

## 2023-03-15 DIAGNOSIS — R791 Abnormal coagulation profile: Secondary | ICD-10-CM | POA: Diagnosis not present

## 2023-03-15 DIAGNOSIS — F1210 Cannabis abuse, uncomplicated: Secondary | ICD-10-CM | POA: Diagnosis not present

## 2023-03-15 DIAGNOSIS — I5032 Chronic diastolic (congestive) heart failure: Secondary | ICD-10-CM | POA: Diagnosis not present

## 2023-03-15 DIAGNOSIS — E538 Deficiency of other specified B group vitamins: Secondary | ICD-10-CM | POA: Diagnosis not present

## 2023-03-15 DIAGNOSIS — H269 Unspecified cataract: Secondary | ICD-10-CM | POA: Diagnosis not present

## 2023-03-15 DIAGNOSIS — K219 Gastro-esophageal reflux disease without esophagitis: Secondary | ICD-10-CM | POA: Diagnosis not present

## 2023-03-15 DIAGNOSIS — Z7901 Long term (current) use of anticoagulants: Secondary | ICD-10-CM | POA: Diagnosis not present

## 2023-03-15 DIAGNOSIS — K029 Dental caries, unspecified: Secondary | ICD-10-CM | POA: Diagnosis not present

## 2023-03-15 DIAGNOSIS — D539 Nutritional anemia, unspecified: Secondary | ICD-10-CM | POA: Diagnosis not present

## 2023-03-15 DIAGNOSIS — I6529 Occlusion and stenosis of unspecified carotid artery: Secondary | ICD-10-CM | POA: Diagnosis not present

## 2023-03-15 DIAGNOSIS — I442 Atrioventricular block, complete: Secondary | ICD-10-CM | POA: Diagnosis not present

## 2023-03-15 DIAGNOSIS — G629 Polyneuropathy, unspecified: Secondary | ICD-10-CM | POA: Diagnosis not present

## 2023-03-15 DIAGNOSIS — J45909 Unspecified asthma, uncomplicated: Secondary | ICD-10-CM | POA: Diagnosis not present

## 2023-03-15 DIAGNOSIS — I471 Supraventricular tachycardia, unspecified: Secondary | ICD-10-CM | POA: Diagnosis not present

## 2023-03-15 DIAGNOSIS — E46 Unspecified protein-calorie malnutrition: Secondary | ICD-10-CM | POA: Diagnosis not present

## 2023-03-17 ENCOUNTER — Other Ambulatory Visit: Payer: Self-pay | Admitting: Internal Medicine

## 2023-03-17 DIAGNOSIS — G621 Alcoholic polyneuropathy: Secondary | ICD-10-CM

## 2023-03-20 DIAGNOSIS — E46 Unspecified protein-calorie malnutrition: Secondary | ICD-10-CM | POA: Diagnosis not present

## 2023-03-20 DIAGNOSIS — I471 Supraventricular tachycardia, unspecified: Secondary | ICD-10-CM | POA: Diagnosis not present

## 2023-03-20 DIAGNOSIS — H269 Unspecified cataract: Secondary | ICD-10-CM | POA: Diagnosis not present

## 2023-03-20 DIAGNOSIS — D539 Nutritional anemia, unspecified: Secondary | ICD-10-CM | POA: Diagnosis not present

## 2023-03-20 DIAGNOSIS — H409 Unspecified glaucoma: Secondary | ICD-10-CM | POA: Diagnosis not present

## 2023-03-20 DIAGNOSIS — J45909 Unspecified asthma, uncomplicated: Secondary | ICD-10-CM | POA: Diagnosis not present

## 2023-03-20 DIAGNOSIS — K219 Gastro-esophageal reflux disease without esophagitis: Secondary | ICD-10-CM | POA: Diagnosis not present

## 2023-03-20 DIAGNOSIS — I110 Hypertensive heart disease with heart failure: Secondary | ICD-10-CM | POA: Diagnosis not present

## 2023-03-20 DIAGNOSIS — I6529 Occlusion and stenosis of unspecified carotid artery: Secondary | ICD-10-CM | POA: Diagnosis not present

## 2023-03-20 DIAGNOSIS — K029 Dental caries, unspecified: Secondary | ICD-10-CM | POA: Diagnosis not present

## 2023-03-20 DIAGNOSIS — E538 Deficiency of other specified B group vitamins: Secondary | ICD-10-CM | POA: Diagnosis not present

## 2023-03-20 DIAGNOSIS — R791 Abnormal coagulation profile: Secondary | ICD-10-CM | POA: Diagnosis not present

## 2023-03-20 DIAGNOSIS — F1210 Cannabis abuse, uncomplicated: Secondary | ICD-10-CM | POA: Diagnosis not present

## 2023-03-20 DIAGNOSIS — I442 Atrioventricular block, complete: Secondary | ICD-10-CM | POA: Diagnosis not present

## 2023-03-20 DIAGNOSIS — G629 Polyneuropathy, unspecified: Secondary | ICD-10-CM | POA: Diagnosis not present

## 2023-03-20 DIAGNOSIS — Z79891 Long term (current) use of opiate analgesic: Secondary | ICD-10-CM | POA: Diagnosis not present

## 2023-03-20 DIAGNOSIS — I5032 Chronic diastolic (congestive) heart failure: Secondary | ICD-10-CM | POA: Diagnosis not present

## 2023-03-20 DIAGNOSIS — Z7901 Long term (current) use of anticoagulants: Secondary | ICD-10-CM | POA: Diagnosis not present

## 2023-03-21 ENCOUNTER — Other Ambulatory Visit: Payer: Self-pay | Admitting: Internal Medicine

## 2023-03-21 ENCOUNTER — Ambulatory Visit: Payer: Medicaid Other

## 2023-03-21 DIAGNOSIS — G629 Polyneuropathy, unspecified: Secondary | ICD-10-CM

## 2023-03-23 DIAGNOSIS — H269 Unspecified cataract: Secondary | ICD-10-CM | POA: Diagnosis not present

## 2023-03-23 DIAGNOSIS — I110 Hypertensive heart disease with heart failure: Secondary | ICD-10-CM | POA: Diagnosis not present

## 2023-03-23 DIAGNOSIS — D539 Nutritional anemia, unspecified: Secondary | ICD-10-CM | POA: Diagnosis not present

## 2023-03-23 DIAGNOSIS — J45909 Unspecified asthma, uncomplicated: Secondary | ICD-10-CM | POA: Diagnosis not present

## 2023-03-23 DIAGNOSIS — I442 Atrioventricular block, complete: Secondary | ICD-10-CM | POA: Diagnosis not present

## 2023-03-23 DIAGNOSIS — Z79891 Long term (current) use of opiate analgesic: Secondary | ICD-10-CM | POA: Diagnosis not present

## 2023-03-23 DIAGNOSIS — I471 Supraventricular tachycardia, unspecified: Secondary | ICD-10-CM | POA: Diagnosis not present

## 2023-03-23 DIAGNOSIS — I5032 Chronic diastolic (congestive) heart failure: Secondary | ICD-10-CM | POA: Diagnosis not present

## 2023-03-23 DIAGNOSIS — H409 Unspecified glaucoma: Secondary | ICD-10-CM | POA: Diagnosis not present

## 2023-03-23 DIAGNOSIS — I6529 Occlusion and stenosis of unspecified carotid artery: Secondary | ICD-10-CM | POA: Diagnosis not present

## 2023-03-23 DIAGNOSIS — K219 Gastro-esophageal reflux disease without esophagitis: Secondary | ICD-10-CM | POA: Diagnosis not present

## 2023-03-23 DIAGNOSIS — E46 Unspecified protein-calorie malnutrition: Secondary | ICD-10-CM | POA: Diagnosis not present

## 2023-03-23 DIAGNOSIS — F1210 Cannabis abuse, uncomplicated: Secondary | ICD-10-CM | POA: Diagnosis not present

## 2023-03-23 DIAGNOSIS — E538 Deficiency of other specified B group vitamins: Secondary | ICD-10-CM | POA: Diagnosis not present

## 2023-03-23 DIAGNOSIS — K029 Dental caries, unspecified: Secondary | ICD-10-CM | POA: Diagnosis not present

## 2023-03-23 DIAGNOSIS — R791 Abnormal coagulation profile: Secondary | ICD-10-CM | POA: Diagnosis not present

## 2023-03-23 DIAGNOSIS — G629 Polyneuropathy, unspecified: Secondary | ICD-10-CM | POA: Diagnosis not present

## 2023-03-23 DIAGNOSIS — Z7901 Long term (current) use of anticoagulants: Secondary | ICD-10-CM | POA: Diagnosis not present

## 2023-03-28 ENCOUNTER — Ambulatory Visit: Payer: Medicaid Other | Attending: Cardiology

## 2023-03-28 DIAGNOSIS — Z7901 Long term (current) use of anticoagulants: Secondary | ICD-10-CM

## 2023-03-28 LAB — POCT INR: INR: 2.9 (ref 2.0–3.0)

## 2023-03-28 NOTE — Progress Notes (Deleted)
Cardiology Office Note:  .   Date:  03/28/2023  ID:  Mary [name], DOB 1961/03/21, MRN [id number] PCP: Marcine Matar, MD  [facility] Providers Cardiologist:  Donato Schultz, MD Cardiology APP:  Beatrice Lecher, PA-C  Electrophysiologist:  Lewayne Bunting, MD { Click to update primary MD,subspecialty MD or APP then REFRESH:1}   History of Present Illness: .   Mary [name] is a 62 y.o. 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  Implantable loop recorder was recommended but insurance would not cover.  Did have some dark stools at one point and was recommended to go to the emergency room after seen in follow-up by Dr. Anne Fu April 2021.  Patient declined at that time.  Hemoglobin was 7.6.  Seen by GI and Lovenox bridge for any endoscopies were needed.  She was seen 05/2021 by Dr. Anne Fu and at that time was feeling fine.  Reported her experience have been a battle but a good battle.  She denied an aide to help her at home but reports when she cooks it takes a little longer than usual.  Patient can sometimes hear her clicking heartbeat from the mechanical mitral valve.  Denied any chest pain/tightness or pressure, palpitations, shortness of breath, lower extremity edema, dizziness, fatigue, lightheadedness, NVD, or syncope.  Today, she***  ROS: ***  Studies Reviewed: .        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 Ladona Ridgel 11/2019: no pacer indicated  Hx of syncope 03/2019; ILR recommended; no insurance coverage S/p TIA in 05/2016 (sub therapeutic INR) Sinus tachycardia Beta blocker tried in 5/17 but stopped due to high grade heart block >> seen by EP; no PPM needed Cardiac Catheterization 2014:  No CAD Macrocytic anemia 2/2 B12 deficiency   Prior CV studies: Echocardiogram 12/31/2019 EF 55-60, no R WMA, normal RV SF, mitral valve replacement without evidence of regurgitation, tricuspid valve repair stable   Echocardiogram 03/06/2019 EF > 65, no RWMA, s/p mechanical MVR with normal function, s/p TV repair with trivial TR   Holter 03/30/18 Normal sinus rhythm and sinus tachycardia with average heart rate 109 bpm. Heart rate ranged from 27 to 171 bpm. Intermittent complete heart block with slow ventricular response at 47 bpm during the daytime. SVT at 171 bpm Occasional PVCs and wide-complex tachycardia up to 5 beats   Echocardiogram 02/06/18 EF 55-60, no RWMA, normally functioning mechanical MVR, TV repair functioning normally   Event Monitor 02/24/16 1 episode of high grade heart block at night.  Otherwise, mild sinus tachycardia.     Carotid US 9/17 Bilateral: soft plaque throughout CCA, ICA and ECA. 1-39% ICA plaquing. Vertebral artery flow is antegrade.   Echo 06/06/16 EF 55-60%, mechanical MVR functioning normally, mild RAE, TV repair functioning normally   Echo 11/03/15 Posterior wall mild LVH, EF 55-60%, normal wall motion, mechanical MVR okay, normal  RVSF   Echo 01/01/15 Mild focal basal septal hypertrophy, EF 60-65%, normal wall motion, S/P MVR (St Jude mechanical valve) functioning well, mild MR (small mobile echodensity-likely redundant chord versus suture), mild LAE, TV mean gradient 4 mmHg.    Echo 7/15 Mild LVH, EF 60-65%, normal wall motion, grade 1 diastolic dysfunction,  S/P MVR (St Jude mechanical valve) and appears functioning well, mild MR, no perivalvular leak, mild LAE, mildly reduced RVSF.    Carotid US 8/14 Bilateral - 1% to 39% ICA stenosis    Cardiac cath 6/14 Left main: No obstructive disease.  Left Anterior Descending Artery:  No obstructive disease noted.   Circumflex Artery:   No obstructive disease.   Right Coronary Artery:  no obstructive disease.  Left Ventricular Angiogram: LVEF 65-70%. 3+ MR Distal Aortogram: No aneurysm. No stenosis.    Risk Assessment/Calculations:   {Does this patient have ATRIAL FIBRILLATION?:[phone]} No BP recorded.  {Refresh Note OR Click here to enter BP  :1}***       Physical Exam:   VS:  LMP 01/28/2013    Wt Readings from Last 3 Encounters:  01/25/23 149 lb 14.6 oz (68 kg)  12/19/22 150 lb (68 kg)  12/05/22 150 lb (68 kg)    GEN: Well nourished, well developed in no acute distress NECK: No JVD; No carotid bruits CARDIAC: ***RRR, no murmurs, rubs, gallops RESPIRATORY:  Clear to auscultation without rales, wheezing or rhonchi  ABDOMEN: Soft, non-tender, non-distended EXTREMITIES:  No edema; No deformity   ASSESSMENT AND PLAN: .   1.  Status post mitral valve replacement with metallic valve 2.  Intermittent complete heart block 3.  Warfarin anticoagulation    {Are you ordering a CV Procedure (e.g. stress test, cath, DCCV, TEE, etc)?   Press F2        :[id number]}  Dispo: ***  Signed, Sharlene Dory, PA-C

## 2023-03-28 NOTE — Patient Instructions (Signed)
continue to take warfarin 1.5 tablets daily except for 1 tablet on Sunday and Thursday.  Recheck INR in 3 weeks.  Coumadin Clinic [phone]

## 2023-03-29 ENCOUNTER — Encounter: Payer: Self-pay | Admitting: Physician Assistant

## 2023-03-29 ENCOUNTER — Ambulatory Visit: Payer: Medicaid Other | Admitting: Physician Assistant

## 2023-03-29 DIAGNOSIS — G459 Transient cerebral ischemic attack, unspecified: Secondary | ICD-10-CM

## 2023-03-29 DIAGNOSIS — I442 Atrioventricular block, complete: Secondary | ICD-10-CM

## 2023-03-29 DIAGNOSIS — Z954 Presence of other heart-valve replacement: Secondary | ICD-10-CM

## 2023-03-29 DIAGNOSIS — I4811 Longstanding persistent atrial fibrillation: Secondary | ICD-10-CM

## 2023-04-13 ENCOUNTER — Ambulatory Visit: Payer: Medicaid Other | Admitting: Internal Medicine

## 2023-04-14 ENCOUNTER — Other Ambulatory Visit: Payer: Self-pay | Admitting: Internal Medicine

## 2023-04-14 DIAGNOSIS — K920 Hematemesis: Secondary | ICD-10-CM

## 2023-04-17 ENCOUNTER — Ambulatory Visit: Payer: Medicaid Other | Attending: Cardiology | Admitting: *Deleted

## 2023-04-17 DIAGNOSIS — Z7901 Long term (current) use of anticoagulants: Secondary | ICD-10-CM | POA: Diagnosis not present

## 2023-04-17 LAB — POCT INR: INR: 4.2 — AB (ref 2.0–3.0)

## 2023-04-17 NOTE — Patient Instructions (Addendum)
Description   Do not take any warfarin today then continue taking warfarin 1.5 tablets daily except for 1 tablet on Sunday and Thursday. Recheck INR in 2 weeks.  Coumadin Clinic [phone] or [phone]

## 2023-04-23 ENCOUNTER — Other Ambulatory Visit: Payer: Self-pay | Admitting: Family Medicine

## 2023-04-23 DIAGNOSIS — G621 Alcoholic polyneuropathy: Secondary | ICD-10-CM

## 2023-04-24 ENCOUNTER — Other Ambulatory Visit: Payer: Self-pay | Admitting: Internal Medicine

## 2023-04-24 DIAGNOSIS — G621 Alcoholic polyneuropathy: Secondary | ICD-10-CM

## 2023-04-24 NOTE — Telephone Encounter (Signed)
Medication Refill - Medication: gabapentin (NEURONTIN) 300 MG capsule  Has the patient contacted their pharmacy? Yes   Preferred Pharmacy (with phone number or street name): [facility] [address] ([address]), [place] - [address]  [address] [place]) [place] [address]  Phone:  [phone]  Fax:  [phone]    Has the patient been seen for an appointment in the last year OR does the patient have an upcoming appointment? Yes.    Agent: Please be advised that RX refills may take up to 3 business days. We ask that you follow-up with your pharmacy.

## 2023-04-25 ENCOUNTER — Ambulatory Visit: Payer: Self-pay

## 2023-04-25 ENCOUNTER — Telehealth: Payer: Self-pay | Admitting: Internal Medicine

## 2023-04-25 MED ORDER — GABAPENTIN 300 MG PO CAPS
300.0000 mg | ORAL_CAPSULE | Freq: Three times a day (TID) | ORAL | 0 refills | Status: DC
Start: 2023-04-25 — End: 2023-07-17

## 2023-04-25 NOTE — Telephone Encounter (Signed)
Medication has been sent to the pharmacy at this time.

## 2023-04-25 NOTE — Telephone Encounter (Signed)
Medication Refill - Medication: gabapentin (NEURONTIN) 300 MG capsule   Has the patient contacted their pharmacy? No.  (Agent: If yes, when and what did the pharmacy advise?)  Preferred Pharmacy (with phone number or street name):  [facility] [address] ([address]), [place] - [address]  [address] [place] (SE) [place] [address]  Phone: [phone] Fax: [phone]  Hours: Not open 24 hours   Has the patient been seen for an appointment in the last year OR does the patient have an upcoming appointment? Yes.    Agent: Please be advised that RX refills may take up to 3 business days. We ask that you follow-up with your pharmacy.

## 2023-04-25 NOTE — Telephone Encounter (Signed)
Requested Prescriptions  Pending Prescriptions Disp Refills   gabapentin (NEURONTIN) 300 MG capsule 270 capsule 0    Sig: Take 1 capsule (300 mg total) by mouth 3 (three) times daily.     Neurology: Anticonvulsants - gabapentin Failed - 04/24/2023 11:25 AM      Failed - Cr in normal range and within 360 days    Creat  Date Value Ref Range Status  10/21/2015 0.82 0.50 - 1.05 mg/dL Final   Creatinine, Ser  Date Value Ref Range Status  01/25/2023 1.01 (H) 0.44 - 1.00 mg/dL Final   Creatinine, Urine  Date Value Ref Range Status  01/10/2016 197.42 mg/dL Final         Passed - Completed PHQ-2 or PHQ-9 in the last 360 days      Passed - Valid encounter within last 12 months    Recent Outpatient Visits           4 months ago Hospital discharge follow-up   [facility] Marcine Matar, MD   10 months ago Overweight   [facility] Hoy Register, MD   1 year ago Vitamin B12 deficiency   [facility] [place], [place], [place]   1 year ago Vitamin B12 deficiency   [facility] Marcine Matar, MD   2 years ago Hematemesis of unknown cause   [facility] Marcine Matar, MD       Future Appointments             In 2 months Laural Benes, Binnie Rail, MD [facility]

## 2023-04-25 NOTE — Telephone Encounter (Signed)
Chief Complaint: Medication refill Symptoms: Full body pain  Frequency: chronic  Pertinent Negatives: Patient denies all other symptoms Disposition: [] ED /[] Urgent Care (no appt availability in office) / [] Appointment(In office/virtual)/ []  [place] [facility]/ [x] Home Care/ [] Refused Recommended Disposition /[] [place] [facility]/ []  Follow-up with PCP Additional Notes: Patient states she has been completely out of her gabapentin Rx since Saturday morning and her pain has progressively gotten worse without the medication. Patient has requested the medication be refilled 3 times. I was able to send the refill to the pharmacy while on the line with the patient. Advised patient if the medication does not improve the symptoms to callback to be triaged. Patient verbalized understanding.   Reason for Disposition  [1] Prescription prescribed recently is not at pharmacy AND [2] triager has access to patient's EMR AND [3] prescription is recorded in the EMR  Answer Assessment - Initial Assessment Questions 1. NAME of MEDICINE: "What medicine(s) are you calling about?"     Gabapentin  2. QUESTION: "What is your question?" (e.g., double dose of medicine, side effect)     Can I get a refill I am completely out of medication.  3. PRESCRIBER: "Who prescribed the medicine?" Reason: if prescribed by specialist, call should be referred to that group.     Dr. Laural Benes 4. SYMPTOMS: "Do you have any symptoms?" If Yes, ask: "What symptoms are you having?"  "How bad are the symptoms (e.g., mild, moderate, severe)     Full body pain, 10/10 right now  Protocols used: Medication Question Call-A-AH

## 2023-05-01 ENCOUNTER — Ambulatory Visit: Payer: Medicaid Other | Attending: Cardiology | Admitting: *Deleted

## 2023-05-01 DIAGNOSIS — Z7901 Long term (current) use of anticoagulants: Secondary | ICD-10-CM

## 2023-05-01 LAB — POCT INR: INR: 5.9 — AB (ref 2.0–3.0)

## 2023-05-01 NOTE — Patient Instructions (Addendum)
Description   Do not take any warfarin today, No warfarin, Tuesday, No Warfarin Wednesday, then START taking warfarin 1 tablet daily except for 1.5 tablets Sunday, Tuesday, and Thursday. Please resume regular/ensure intake. Recheck INR in 1 week.  Coumadin Clinic [phone] or [phone]

## 2023-05-02 ENCOUNTER — Other Ambulatory Visit: Payer: Self-pay | Admitting: Internal Medicine

## 2023-05-02 DIAGNOSIS — K920 Hematemesis: Secondary | ICD-10-CM

## 2023-05-02 DIAGNOSIS — G629 Polyneuropathy, unspecified: Secondary | ICD-10-CM

## 2023-05-08 ENCOUNTER — Ambulatory Visit: Payer: Medicaid Other

## 2023-05-11 ENCOUNTER — Ambulatory Visit: Payer: Medicaid Other | Attending: Cardiology

## 2023-05-11 DIAGNOSIS — Z7901 Long term (current) use of anticoagulants: Secondary | ICD-10-CM | POA: Diagnosis not present

## 2023-05-11 LAB — POCT INR: INR: 2.3 (ref 2.0–3.0)

## 2023-05-11 NOTE — Patient Instructions (Signed)
Description   Take 1.5 tablets today, then resume same dosage of warfarin 1 tablet daily except for 1.5 tablets Sundays, Tuesdays, and Thursdays. Please resume regular/ensure intake. Recheck INR in 2 weeks.  Coumadin Clinic [phone] or [phone]

## 2023-05-26 ENCOUNTER — Ambulatory Visit: Payer: Medicaid Other

## 2023-05-26 ENCOUNTER — Telehealth: Payer: Self-pay | Admitting: *Deleted

## 2023-05-26 NOTE — Telephone Encounter (Signed)
Called pt since she missed Anticoagulation Appt this morning; there was no answer so left a message. Will wait a call back from the pt.

## 2023-06-07 ENCOUNTER — Ambulatory Visit: Payer: Medicaid Other | Attending: Internal Medicine

## 2023-06-07 DIAGNOSIS — Z7901 Long term (current) use of anticoagulants: Secondary | ICD-10-CM | POA: Diagnosis not present

## 2023-06-07 DIAGNOSIS — Z954 Presence of other heart-valve replacement: Secondary | ICD-10-CM | POA: Diagnosis not present

## 2023-06-07 LAB — POCT INR: INR: 3.6 — AB (ref 2.0–3.0)

## 2023-06-07 MED ORDER — WARFARIN SODIUM 5 MG PO TABS
ORAL_TABLET | ORAL | 0 refills | Status: DC
Start: 2023-06-07 — End: 2023-09-21

## 2023-06-07 NOTE — Patient Instructions (Addendum)
Description   Drink an ensure today and continue taking warfarin 1 tablet daily except for 1.5 tablets Sundays, Tuesdays, and Thursdays.  Continue regular ensure intake.  Recheck INR in 3 weeks.  Coumadin Clinic [phone] or [phone]

## 2023-06-19 ENCOUNTER — Other Ambulatory Visit: Payer: Self-pay | Admitting: Internal Medicine

## 2023-06-19 NOTE — Telephone Encounter (Signed)
Medication Refill - Medication: Tramadol 50 mg  Has the patient contacted their pharmacy? yes (Agent: If no, request that the patient contact the pharmacy for the refill. If patient does not wish to contact the pharmacy document the reason why and proceed with request.) (Agent: If yes, when and what did the pharmacy advise?)  Preferred Pharmacy (with phone number [facility]): [facility] Has the patient been seen for an appointment in the last year OR does the patient have an upcoming appointment? Yes.    Agent: Please be advised that RX refills may take up to 3 business days. We ask that you follow-up with your pharmacy.

## 2023-06-20 NOTE — Telephone Encounter (Signed)
Requested medication (s) are due for refill today: Yes  Requested medication (s) are on the active medication list: Yes  Last refill:  01/23/23  Future visit scheduled:   Notes to clinic:  Not delegated.    Requested Prescriptions  Pending Prescriptions Disp Refills   traMADol (ULTRAM) 50 MG tablet 10 tablet 0    Sig: Take 1 tablet (50 mg total) by mouth at bedtime as needed.     Not Delegated - Analgesics:  Opioid Agonists Failed - 06/19/2023  2:23 PM      Failed - This refill cannot be delegated      Failed - Valid encounter within last 3 months    Recent Outpatient Visits           6 months ago Hospital discharge follow-up   [facility] Marcine Matar, MD   11 months ago Overweight   [facility] Hoy Register, MD   1 year ago Vitamin B12 deficiency   [facility] [place], [place], [place]   2 years ago Vitamin B12 deficiency   [facility] Marcine Matar, MD   2 years ago Hematemesis of unknown cause   [facility] Marcine Matar, MD       Future Appointments             In 3 weeks Marcine Matar, MD [facility]            Passed - Urine Drug Screen completed in last 360 days

## 2023-06-21 MED ORDER — TRAMADOL HCL 50 MG PO TABS: 50.0000 mg | ORAL_TABLET | Freq: Every evening | ORAL | 0 refills | Status: DC | PRN

## 2023-06-26 ENCOUNTER — Ambulatory Visit: Payer: Medicaid Other

## 2023-06-28 ENCOUNTER — Ambulatory Visit: Payer: Medicaid Other

## 2023-07-10 ENCOUNTER — Ambulatory Visit: Payer: Medicaid Other | Attending: Cardiology | Admitting: *Deleted

## 2023-07-10 DIAGNOSIS — Z7901 Long term (current) use of anticoagulants: Secondary | ICD-10-CM

## 2023-07-10 LAB — POCT INR: INR: 6.7 — AB (ref 2.0–3.0)

## 2023-07-10 LAB — PROTIME-INR
INR: 5.4 (ref 0.9–1.2)
Prothrombin Time: 53.8 s — ABNORMAL HIGH (ref 9.1–12.0)

## 2023-07-17 ENCOUNTER — Ambulatory Visit: Payer: Medicaid Other | Attending: Internal Medicine | Admitting: Internal Medicine

## 2023-07-17 ENCOUNTER — Encounter: Payer: Self-pay | Admitting: Internal Medicine

## 2023-07-17 VITALS — BP 114/81 | HR 114 | Temp 99.0°F | Ht 62.0 in | Wt 149.0 lb

## 2023-07-17 DIAGNOSIS — K219 Gastro-esophageal reflux disease without esophagitis: Secondary | ICD-10-CM

## 2023-07-17 DIAGNOSIS — Z2821 Immunization not carried out because of patient refusal: Secondary | ICD-10-CM | POA: Diagnosis not present

## 2023-07-17 DIAGNOSIS — F1090 Alcohol use, unspecified, uncomplicated: Secondary | ICD-10-CM

## 2023-07-17 DIAGNOSIS — I5032 Chronic diastolic (congestive) heart failure: Secondary | ICD-10-CM

## 2023-07-17 DIAGNOSIS — F1021 Alcohol dependence, in remission: Secondary | ICD-10-CM | POA: Insufficient documentation

## 2023-07-17 DIAGNOSIS — L602 Onychogryphosis: Secondary | ICD-10-CM | POA: Diagnosis not present

## 2023-07-17 DIAGNOSIS — G621 Alcoholic polyneuropathy: Secondary | ICD-10-CM | POA: Diagnosis not present

## 2023-07-17 DIAGNOSIS — D6859 Other primary thrombophilia: Secondary | ICD-10-CM | POA: Diagnosis not present

## 2023-07-17 DIAGNOSIS — E538 Deficiency of other specified B group vitamins: Secondary | ICD-10-CM

## 2023-07-17 DIAGNOSIS — N1831 Chronic kidney disease, stage 3a: Secondary | ICD-10-CM | POA: Diagnosis not present

## 2023-07-17 DIAGNOSIS — E519 Thiamine deficiency, unspecified: Secondary | ICD-10-CM

## 2023-07-17 DIAGNOSIS — Z1231 Encounter for screening mammogram for malignant neoplasm of breast: Secondary | ICD-10-CM

## 2023-07-17 MED ORDER — PANTOPRAZOLE SODIUM 40 MG PO TBEC: 40.0000 mg | DELAYED_RELEASE_TABLET | Freq: Two times a day (BID) | ORAL | 0 refills | Status: DC

## 2023-07-17 MED ORDER — GABAPENTIN 300 MG PO CAPS: 300.0000 mg | ORAL_CAPSULE | Freq: Three times a day (TID) | ORAL | 2 refills | Status: DC

## 2023-07-17 NOTE — Progress Notes (Signed)
Patient ID: Mary [name], female    DOB: 1960/12/14  MRN: [id number]  CC: Follow-up (Follow-up. /Discuss alternative for pantoprazole - insurance does not cover/Requesting referral to podiatrist -Valentino Hue to mammogram. No to flu / shingles)   Subjective: Mary [name] is a 62 y.o. female who presents for chronic ds management. 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  Previously paid $4 probably for 1 mth supply Taking BID because still gets symptoms with once a day dosing.  Drinks OJ most mornings and likes spaghetti and pizza.   CHF/VHD:  no CP or LE edema.  Sleeps on 4-5 pillows which is her usual, no PND.  Over due for f/u with Dr. Anne Fu Followed in Coumadin clinic for INR.  Denies any palpitations. We have been keeping an eye on her kidney function.  Last GFR was in the 50s.  ETOH/peripheral neuropathy:  reports she drinks every now and again; has not drank anything in 2-3 wks.  "Previously it was morning, noon and night."  Still taking Gabapentin 3x/day for peripheral neuropathy symptoms and finds it helpful. -no falls this year. Uses cane.  Currently lives on the second floor of two-story apartment building that does not have an Engineer, structural.  Trying to get a first-floor apartment so she does not have to climb the stairs.  She is also requesting handicap sticker form for her son Mary [name].  When she is not using medical transportation, she has to use him. B12 def/Thiamine: Reports taking B12 and thiamine supplement daily from OTC  Wants podiatry appt.  Toenails overgrown  HM: She  declines flu and shingles vaccine. 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   Pharyngitis 12/06/2022   Neuropathy 12/06/2022   Tachycardia 12/05/2022   Supratherapeutic INR 12/05/2022   Hyperkalemia 12/05/2022   Hypotension due to hypovolemia 07/19/2022   Prolonged QT interval 07/19/2022   Marijuana user 12/24/2020   LFT elevation    Acquired pyloric stenosis    GI bleed 11/24/2020   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   Thiamin deficiency 09/01/2020   Paresthesia    Vitamin B12 deficiency 05/04/2020   Vomiting 04/24/2020   GERD (gastroesophageal reflux disease)    Marijuana abuse    Macrocytic anemia 04/04/2019   Intermittent complete heart block (HCC)    Syncope 03/28/2019   Orthostatic hypotension 01/11/2017   Elevated liver enzymes 01/11/2017   Atypical chest pain 06/05/2016   Acute renal failure superimposed on stage 3a chronic kidney disease (HCC) 01/10/2016   Elevated INR 01/10/2016   TIA (transient ischemic attack) 09/13/2015   CVA (cerebral vascular accident) (HCC) 09/12/2015   SOB (shortness of breath) 02/24/2014   First degree heart block 06/17/2013   (HFpEF) heart failure with preserved ejection fraction (HCC) 06/16/2013   History of bacterial endocarditis 06/16/2013   Warfarin anticoagulation 06/10/2013   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   Femoral hernia 05/23/2013   Mitral valve insufficiency 04/22/2013   Tricuspid regurgitation 04/22/2013   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   SVT (supraventricular  tachycardia) (HCC) 03/16/2013     Current Outpatient Medications on File Prior to Visit  Medication Sig Dispense Refill   acetaminophen (TYLENOL) 500 MG tablet Take 1,000 mg by mouth in the morning, at noon, and at bedtime.     folic acid (FOLVITE) 1 MG tablet Take 1 tablet by mouth once daily 90 tablet 0    gabapentin (NEURONTIN) 300 MG capsule Take 1 capsule (300 mg total) by mouth 3 (three) times daily. 270 capsule 0   latanoprost (XALATAN) 0.005 % ophthalmic solution Place 1 drop into both eyes at bedtime.     pantoprazole (PROTONIX) 40 MG tablet Take 1 tablet by mouth once daily 90 tablet 0   thiamine 100 MG tablet Take 1 tablet (100 mg total) by mouth daily. 100 tablet 0   traMADol (ULTRAM) 50 MG tablet Take 1 tablet (50 mg total) by mouth at bedtime as needed. 10 tablet 0   warfarin (COUMADIN) 5 MG tablet TAKE 5MG  TO 7.5MG  BY MOUTH DAILY AS DIRECTED BY COUMADIN CLINIC 115 tablet 0   No current facility-administered medications on file prior to visit.    Allergies  Allergen Reactions   Ibuprofen Itching   Oxycodone Itching    Pt states no longer makes her itchy   Tape Itching, Rash and Other (See Comments)    THE ONLY TAPE THAT IS TOLERATED IS PAPER TAPE. EKG leads inflame the skin    Social History   Socioeconomic History   Marital status: Single    Spouse name: Not on file   Number of children: Not on file   Years of education: Not on file   Highest education level: Not on file  Occupational History   Occupation: unemployed  Tobacco Use   Smoking status: Former    Current packs/day: 0.00    Average packs/day: 0.5 packs/day for 46.0 years (23.0 ttl pk-yrs)    Types: Cigarettes    Start date: 57    Quit date: 2022    Years since quitting: 2.8   Smokeless tobacco: Never  Vaping Use   Vaping status: Never Used  Substance and Sexual Activity   Alcohol use: Not Currently    Comment:  "glass of wine"   Drug use: Yes    Types: Marijuana    Comment: chews on it periodically   Sexual activity: Never  Other Topics Concern   Not on file  Social History Narrative   Right handed   Two story home apartment    Social Determinants of Health   Financial Resource Strain: Not on file  Food Insecurity: No Food Insecurity (01/24/2023)   Hunger Vital Sign    Worried About  Running Out of Food in the Last Year: Never true    Ran Out of Food in the Last Year: Never true  Transportation Needs: No Transportation Needs (01/24/2023)   PRAPARE - Administrator, Civil Service (Medical): No    Lack of Transportation (Non-Medical): No  Physical Activity: Not on file  Stress: Not on file  Social Connections: Not on file  Intimate Partner Violence: Not At Risk (01/24/2023)   Humiliation, Afraid, Rape, and Kick questionnaire    Fear of Current or Ex-Partner: No    Emotionally Abused: No    Physically Abused: No    Sexually Abused: No    Family History  Problem Relation Age of Onset   Kidney disease Mother        Had one Kidney removed   Stroke Father  Cancer Father    Sarcoidosis Sister    Heart attack Neg Hx    Colon cancer Neg Hx    Stomach cancer Neg Hx    Pancreatic cancer Neg Hx    Esophageal cancer Neg Hx     Past Surgical History:  Procedure Laterality Date   APPENDECTOMY  ~1978   BALLOON DILATION N/A 11/27/2020   Procedure: BALLOON DILATION;  Surgeon: Iva Boop, MD;  [facility] END[facility]COPY;  Service: Endoscopy;  Laterality: N/A;   BIOPSY  05/01/2020   Procedure: BIOPSY;  Surgeon: Rachael Fee, MD;  [facility] END[facility]COPY;  Service: Endoscopy;;   CARDIAC CATHETERIZATION     CARDIAC VALVE REPLACEMENT     CESAREAN SECTION  1983   CESAREAN SECTION WITH BILATERAL TUBAL LIGATION  1999   COLONOSCOPY WITH PROPOFOL N/A 05/01/2020   Procedure: COLONOSCOPY WITH PROPOFOL;  Surgeon: Rachael Fee, MD;  Location[facility] ENDOSCOPY;  Service: Endoscopy;  Laterality: N/A;   ESOPHAGOGASTRODUODENOSCOPY (EGD) WITH PROPOFOL N/A 05/01/2020   Procedure: ESOPHAGOGASTRODUODENOSCOPY (EGD) WITH PROPOFOL;  Surgeon: Rachael Fee, MD;  Location[facility] ENDOSCOPY;  Service: Endoscopy;  Laterality: N/A;   ESOPHAGOGASTRODUODENOSCOPY (EGD) WITH PROPOFOL N/A 11/27/2020   Procedure: ESOPHAGOGASTRODUODENOSCOPY (EGD) WITH PROPOFOL;  Surgeon: Iva Boop, MD;   Location: [facility] ENDOSCOPY;  Service: Endoscopy;  Laterality: N/A;   FEMORAL HERNIA REPAIR Right 05/23/2013   Procedure: HERNIA REPAIR FEMORAL;  Surgeon: Purcell Nails, MD;  [facility] OR;  Service: Open Heart Surgery;  Laterality: Right;   INTRAOPERATIVE TRANSESOPHAGEAL ECHOCARDIOGRAM N/A 05/23/2013   Procedure: INTRAOPERATIVE TRANSESOPHAGEAL ECHOCARDIOGRAM;  Surgeon: Purcell Nails, MD;  Location[facility] OR;  Service: Open Heart Surgery;  Laterality: N/A;   LAPAROSCOPIC CHOLECYSTECTOMY  2003   LEFT AND RIGHT HEART CATHETERIZATION WITH CORONARY ANGIOGRAM N/A 03/22/2013   Procedure: LEFT AND RIGHT HEART CATHETERIZATION WITH CORONARY ANGIOGRAM;  Surgeon: Kathleene Hazel, MD;  [facility] CATH LAB;  Service: Cardiovascular;  Laterality: N/A;   MINIMALLY INVASIVE TRICUSPID VALVE REPAIR Right 05/23/2013   Procedure: MINIMALLY INVASIVE TRICUSPID VALVE REPAIR;  Surgeon: Purcell Nails, MD;  Location: [facility] OR;  Service: Open Heart Surgery;  Laterality: Right;   MITRAL VALVE REPLACEMENT N/A 05/23/2013   Procedure: MITRAL VALVE (MV) REPLACEMENT;  Surgeon: Purcell Nails, MD;  Location: [facility] OR;  Service: Open Heart Surgery;  Laterality: N/A;   MULTIPLE EXTRACTIONS WITH ALVEOLOPLASTY N/A 04/04/2013   Procedure: Extraction of tooth #'s 1,8,9,13,14,15,23,24,25,26 with alveoloplasty and gross debridement of remaining teeth;  Surgeon: Charlynne Pander, DDS;  [facility] OR;  Service: Oral Surgery;  Laterality: N/A;   TEE WITHOUT CARDIOVERSION N/A 03/18/2013   Procedure: TRANSESOPHAGEAL ECHOCARDIOGRAM (TEE);  Surgeon: Laurey Morale, MD;  [facility];  Service: Cardiovascular;  Laterality: N/A;   TEE WITHOUT CARDIOVERSION N/A 06/17/2013   Procedure: TRANSESOPHAGEAL ECHOCARDIOGRAM (TEE);  Surgeon: Lewayne Bunting, MD;  Location[facility] ENDOSCOPY;  Service: Cardiovascular;  Laterality: N/A;   TUBAL LIGATION  1999    ROS: Review of Systems Negative except as stated above  PHYSICAL EXAM: BP 114/81 (BP  Location: Left Arm, Patient Position: Sitting, Cuff Size: Normal)   Pulse (!) 114   Temp 99 F (37.2 C) (Oral)   Ht 5\' 2"  (1.575 m)   Wt 149 lb (67.6 kg)   LMP 01/28/2013   SpO2 96%   BMI 27.25 kg/m   Physical Exam  General appearance - alert, well appearing, and in no distress Mental status - normal mood, behavior, speech, dress, motor  activity, and thought processes Neck - supple, no significant adenopathy Chest - clear to auscultation, no wheezes, rales or rhonchi, symmetric air entry Heart -regular rate and rhythm.  Clicking of mechanical valve heard. Neurological -gait is slowed and wide-based.  Ambulates using a cane. Extremities -no lower extremity edema.      Latest Ref Rng & Units 01/25/2023    7:22 AM 01/24/2023   12:45 PM 01/24/2023   12:01 AM  CMP  Glucose 70 - 99 mg/dL 79  161  096   BUN 6 - 20 mg/dL 9  12  17    Creatinine 0.44 - 1.00 mg/dL 0.45  4.09  8.11   Sodium 135 - 145 mmol/L 138  138  136   Potassium 3.5 - 5.1 mmol/L 3.9  4.3  4.2   Chloride 98 - 111 mmol/L 112  111  105   CO2 22 - 32 mmol/L 22  21  20    Calcium 8.9 - 10.3 mg/dL 8.4  8.2  9.2   Total Protein 6.5 - 8.1 g/dL  6.4    Total Bilirubin 0.3 - 1.2 mg/dL  0.5    Alkaline Phos 38 - 126 U/L  83    AST 15 - 41 U/L  20    ALT 0 - 44 U/L  10     Lipid Panel     Component Value Date/Time   CHOL 189 05/31/2018 1108   TRIG 300 (H) 05/31/2018 1108   HDL 68 05/31/2018 1108   CHOLHDL 2.8 05/31/2018 1108   CHOLHDL 3.2 06/06/2016 0135   VLDL 22 06/06/2016 0135   LDLCALC 61 05/31/2018 1108    CBC    Component Value Date/Time   WBC 6.7 01/25/2023 0722   RBC 2.97 (L) 01/25/2023 0722   HGB 9.8 (L) 01/25/2023 0722   HGB 9.6 (L) 12/24/2020 1201   HCT 31.3 (L) 01/25/2023 0722   HCT 29.0 (L) 12/24/2020 1201   PLT 260 01/25/2023 0722   PLT 361 12/24/2020 1201   MCV 105.4 (H) 01/25/2023 0722   MCV 108 (H) 12/24/2020 1201   MCH 33.0 01/25/2023 0722   MCHC 31.3 01/25/2023 0722   RDW 13.5 01/25/2023  0722   RDW 15.7 (H) 12/24/2020 1201   LYMPHSABS 1.3 01/24/2023 1013   LYMPHSABS 1.9 06/26/2020 1218   MONOABS 0.6 01/24/2023 1013   EOSABS 0.2 01/24/2023 1013   EOSABS 0.2 06/26/2020 1218   BASOSABS 0.0 01/24/2023 1013   BASOSABS 0.0 06/26/2020 1218    ASSESSMENT AND PLAN: 1. Alcoholic peripheral neuropathy (HCC) Commended her for significantly cutting back on EtOH use.  Encouraged to quit completely. - gabapentin (NEURONTIN) 300 MG capsule; Take 1 capsule (300 mg total) by mouth 3 (three) times daily.  Dispense: 270 capsule; Refill: 2  2. Gastroesophageal reflux disease without esophagitis GERD precautions discussed.  Went over foods to avoid including foods that are tomato-based, juices, spicy foods, citrus fruits.  Advised to eat her last meal at least 2 to 3 hours before laying down at night and sleep with her head a little elevated.  Advised patient that apparently her insurance does not pay for the pantoprazole but her co-pay is a bit more because she is getting a 90-day supply.  Increase pantoprazole to twice a day. - pantoprazole (PROTONIX) 40 MG tablet; Take 1 tablet (40 mg total) by mouth 2 (two) times daily before a meal.  Dispense: 180 tablet; Refill: 0  3. Hypercoagulable state (HCC) Due to having a mechanical valve.  On Coumadin and followed in Coumadin clinic.[name]  4. Alcohol use disorder See #1 above.  5. Diastolic CHF, chronic (HCC) Stable.  Does not require diuretics at this time.  Will get her in for follow-up visit with her cardiologist. - Ambulatory referral to Cardiology  6. Chronic kidney disease, stage 3a (HCC) Advised to avoid NSAIDs. - CBC - Comprehensive metabolic panel  7. Vitamin B12 deficiency Continue B12 supplement.  Told her that she should be taking at least 1000 mcg daily.  She will check her bottle for dosage when she returns home. - Vitamin B12  8. Thiamin deficiency Continue over-the-counter thiamine - Vitamin B1  9. Overgrown nail -  Ambulatory referral to Podiatry  10. Influenza vaccination declined   11. Encounter for screening mammogram for malignant neoplasm of breast - MM Digital Screening; Future    Patient was given the opportunity to ask questions.  Patient verbalized understanding of the plan and was able to repeat key elements of the plan.   This documentation was completed using Paediatric nurse.  Any transcriptional errors are unintentional.  No orders of the defined types were placed in this encounter.    Requested Prescriptions   Pending Prescriptions Disp Refills   gabapentin (NEURONTIN) 300 MG capsule 270 capsule 0    Sig: Take 1 capsule (300 mg total) by mouth 3 (three) times daily.    No follow-ups on file.  Jonah Blue, MD, FACP

## 2023-07-18 ENCOUNTER — Other Ambulatory Visit: Payer: Self-pay | Admitting: Internal Medicine

## 2023-07-18 DIAGNOSIS — N1832 Chronic kidney disease, stage 3b: Secondary | ICD-10-CM

## 2023-07-20 ENCOUNTER — Ambulatory Visit: Payer: Medicaid Other

## 2023-07-21 ENCOUNTER — Other Ambulatory Visit: Payer: Self-pay | Admitting: Internal Medicine

## 2023-07-21 LAB — COMPREHENSIVE METABOLIC PANEL
ALT: 9 [IU]/L (ref 0–32)
AST: 19 [IU]/L (ref 0–40)
Albumin: 4.3 g/dL (ref 3.8–4.9)
Alkaline Phosphatase: 118 [IU]/L (ref 44–121)
BUN/Creatinine Ratio: 9 — ABNORMAL LOW (ref 12–28)
BUN: 16 mg/dL (ref 8–27)
Bilirubin Total: 0.2 mg/dL (ref 0.0–1.2)
CO2: 19 mmol/L — ABNORMAL LOW (ref 20–29)
Calcium: 9.4 mg/dL (ref 8.7–10.3)
Chloride: 104 mmol/L (ref 96–106)
Creatinine, Ser: 1.69 mg/dL — ABNORMAL HIGH (ref 0.57–1.00)
Globulin, Total: 2.8 g/dL (ref 1.5–4.5)
Glucose: 100 mg/dL — ABNORMAL HIGH (ref 70–99)
Potassium: 5 mmol/L (ref 3.5–5.2)
Sodium: 141 mmol/L (ref 134–144)
Total Protein: 7.1 g/dL (ref 6.0–8.5)
eGFR: 34 mL/min/{1.73_m2} — ABNORMAL LOW (ref >59)

## 2023-07-21 LAB — CBC
Hematocrit: 37.5 % (ref 34.0–46.6)
Hemoglobin: 12.1 g/dL (ref 11.1–15.9)
MCH: 32.2 pg (ref 26.6–33.0)
MCHC: 32.3 g/dL (ref 31.5–35.7)
MCV: 100 fL — ABNORMAL HIGH (ref 79–97)
Platelets: 406 10*3/uL (ref 150–450)
RBC: 3.76 x10E6/uL — ABNORMAL LOW (ref 3.77–5.28)
RDW: 13.7 % (ref 11.7–15.4)
WBC: 9.9 10*3/uL (ref 3.4–10.8)

## 2023-07-21 LAB — VITAMIN B1: Thiamine: 285.7 nmol/L — ABNORMAL HIGH (ref 66.5–200.0)

## 2023-07-21 LAB — VITAMIN B12: Vitamin B-12: 652 pg/mL (ref 232–1245)

## 2023-07-21 MED ORDER — THIAMINE HCL 100 MG PO TABS: 100.0000 mg | ORAL_TABLET | Freq: Every day | ORAL | 2 refills | Status: DC

## 2023-07-27 ENCOUNTER — Ambulatory Visit: Payer: Medicaid Other | Attending: Cardiology

## 2023-07-27 DIAGNOSIS — Z5181 Encounter for therapeutic drug level monitoring: Secondary | ICD-10-CM | POA: Diagnosis not present

## 2023-07-27 DIAGNOSIS — Z7901 Long term (current) use of anticoagulants: Secondary | ICD-10-CM

## 2023-07-27 LAB — POCT INR: INR: 2.3 (ref 2.0–3.0)

## 2023-07-27 NOTE — Patient Instructions (Signed)
Description   Take 2 tablets today and then resume taking warfarin 1 tablet daily except for 1.5 tablets Sunday and Thursday.  Continue regular ensure intake (1 per week).  Recheck INR in 2 weeks Coumadin Clinic [phone] or [phone]

## 2023-08-10 ENCOUNTER — Ambulatory Visit: Payer: Self-pay

## 2023-08-10 NOTE — Telephone Encounter (Signed)
Chief Complaint: Medication Question   Disposition: [] ED /[] Urgent Care (no appt availability in office) / [] Appointment(In office/virtual)/ []  [place] [facility]/ [x] Home Care/ [] Refused Recommended Disposition /[] [place] [facility]/ []  Follow-up with PCP Additional Notes: Patient asked if she could take tylenol at the same time as taking tramadol. Per [employer] it is safe to take both medications at the same. Care advice was given generally speaking it is safe to take both medications together and advised patient not to take together if she was told not to by her provider. Patient verbalized understanding.   Summary: medication question   Patient called and would like a call back regarding medication question if she can take her Tylenol and Tramadol at the same time?   Patient's callback # [phone]     Reason for Disposition  Caller has medicine question only, adult not sick, AND triager answers question  Answer Assessment - Initial Assessment Questions 1. NAME of MEDICINE: "What medicine(s) are you calling about?"     Tramadol and Tylenol  2. QUESTION: "What is your question?" (e.g., double dose of medicine, side effect)     Can I take tylenol and tramadol together  3. PRESCRIBER: "Who prescribed the medicine?" Reason: if prescribed by specialist, call should be referred to that group.     Dr. Alvis Lemmings  4. SYMPTOMS: "Do you have any symptoms?" If Yes, ask: "What symptoms are you having?"  "How bad are the symptoms (e.g., mild, moderate, severe)     Moderate Pain  Protocols used: Medication Question Call-A-AH

## 2023-08-11 ENCOUNTER — Ambulatory Visit: Payer: Medicaid Other

## 2023-08-11 ENCOUNTER — Other Ambulatory Visit: Payer: Self-pay | Admitting: Internal Medicine

## 2023-08-11 DIAGNOSIS — G629 Polyneuropathy, unspecified: Secondary | ICD-10-CM

## 2023-08-11 MED ORDER — FOLIC ACID 1 MG PO TABS: 1.0000 mg | ORAL_TABLET | Freq: Every day | ORAL | 0 refills | Status: DC

## 2023-08-11 NOTE — Telephone Encounter (Signed)
Requested Prescriptions  Pending Prescriptions Disp Refills   traMADol (ULTRAM) 50 MG tablet 10 tablet 0    Sig: Take 1 tablet (50 mg total) by mouth at bedtime as needed.     Not Delegated - Analgesics:  Opioid Agonists Failed - 08/11/2023  9:49 AM      Failed - This refill cannot be delegated      Passed - Urine Drug Screen completed in last 360 days      Passed - Valid encounter within last 3 months    Recent Outpatient Visits           3 weeks ago Alcoholic peripheral neuropathy (HCC)   [place] [facility] - [facility] Dept Of [place]. [facility] Marcine Matar, MD   7 months ago Hospital discharge follow-up   [facility] - A Dept Of [place]. [facility] Marcine Matar, MD   1 year ago Overweight   [place] [facility] [place] - A Dept Of [place]. [facility] Hoy Register, MD   1 year ago Vitamin B12 deficiency   [place] [facility] [place] - A Dept Of [place]. [facility] [place], [place], [place]   2 years ago Vitamin B12 deficiency   [place] [facility] [place] - A Dept Of [place]. [facility] Marcine Matar, MD       Future Appointments             In 2 weeks Marcine Matar, MD [facility] [place] - A Dept Of [place] Bridegroom. [facility]   In 3 weeks Geni Bers, Tarri Abernethy, PA-C [facility], [facility]             folic acid (FOLVITE) 1 MG tablet 90 tablet 0    Sig: Take 1 tablet (1 mg total) by mouth daily.     Endocrinology:  Vitamins Passed - 08/11/2023  9:49 AM      Passed - Valid encounter within last 12 months    Recent Outpatient Visits           3 weeks ago Alcoholic peripheral neuropathy (HCC)   [place] [facility] - A Dept Of [place]. [facility] Marcine Matar, MD   7 months ago Hospital discharge follow-up   [facility] - A Dept Of [place]. [facility] Marcine Matar, MD   1 year ago Overweight   [place] [facility] [place] - A Dept Of [place]. [facility] [place] Hoy Register, MD   1 year ago Vitamin B12 deficiency   [place] [facility] [place] - A Dept Of [place]. [facility] [place], [place], [place]   2 years ago Vitamin B12 deficiency   [place] [facility] [place] - A Dept Of [place]. [facility] Marcine Matar, MD       Future Appointments             In 2 weeks Marcine Matar, MD [facility] Comm Health [place] - A Dept Of [place] Bridegroom. [facility]   In 3 weeks Geni Bers, Tarri Abernethy, PA-C  [facility] at [facility], [facility]

## 2023-08-11 NOTE — Telephone Encounter (Signed)
Requested medication (s) are due for refill today: yes  Requested medication (s) are on the active medication list: yes  Last refill:  06/21/23  Future visit scheduled: yes  Notes to clinic:  Unable to refill per protocol, cannot delegate.      Requested Prescriptions  Pending Prescriptions Disp Refills   traMADol (ULTRAM) 50 MG tablet 10 tablet 0    Sig: Take 1 tablet (50 mg total) by mouth at bedtime as needed.     Not Delegated - Analgesics:  Opioid Agonists Failed - 08/11/2023  9:49 AM      Failed - This refill cannot be delegated      Passed - Urine Drug Screen completed in last 360 days      Passed - Valid encounter within last 3 months    Recent Outpatient Visits           3 weeks ago Alcoholic peripheral neuropathy (HCC)   [place] [facility] - A Dept Of [place]. [facility] Marcine Matar, MD   7 months ago Hospital discharge follow-up   [facility] [place] [facility] - A Dept Of [place]. [facility] Marcine Matar, MD   1 year ago Overweight   [place] [facility] [place] - A Dept Of [place]. [facility] Hoy Register, MD   1 year ago Vitamin B12 deficiency   [place] [facility] [place] - A Dept Of [place]. [facility] [place], [place], [place]   2 years ago Vitamin B12 deficiency   [place] [facility] [place] - A Dept Of [place]. [facility] Marcine Matar, MD       Future Appointments             In 2 weeks Marcine Matar, MD [facility] Health Comm Health [place] - A Dept Of [place]igh[place] Bridegroom. [facility]   In 3 weeks [place], Tarri Abernethy, PA-C [place] [facility]            Signed Prescriptions Disp Refills   folic acid (FOLVITE) 1 MG tablet 90 tablet 0    Sig: Take 1 tablet (1 mg total) by mouth daily.     Endocrinology:  Vitamins Passed - 08/11/2023  9:49 AM      Passed - Valid encounter within last 12 months    Recent  Outpatient Visits           3 weeks ago Alcoholic peripheral neuropathy (HCC)   [place] [facility] - A Dept Of [place]. [facility] Marcine Matar, MD   7 months ago Hospital discharge follow-up   [facility] - A Dept Of [place]. [facility] Marcine Matar, MD   1 year ago Overweight   [place] [facility] [place] - A Dept Of [place]. [facility] Hoy Register, MD   1 year ago Vitamin B12 deficiency   [place] [facility] [place] - A Dept Of [place]. [facility] [place], [place], [place]   2 years ago Vitamin B12 deficiency   [place] Comm Health [place] - A Dept Of . [facility] Marcine Matar, MD       Future Appointments             In 2 weeks Marcine Matar, MD [facility] Health Comm Health [place] - A Dept Of  [place]. [facility]   In 3 weeks Geni Bers, Tarri Abernethy, PA-C [place] [facility]

## 2023-08-11 NOTE — Telephone Encounter (Signed)
Medication Refill -  Most Recent Primary Care Visit:  Provider: Marcine Matar  Department: [facility]  Visit Type: OFFICE VISIT  Date: 07/17/2023  Medication: traMADol (ULTRAM) 50 MG tablet [[id number]]   folic acid (FOLVITE) 1 MG tablet [[id number]]   Has the patient contacted their pharmacy? Yes (Agent: If no, request that the patient contact the pharmacy for the refill. If patient does not wish to contact the pharmacy document the reason why and proceed with request.) (Agent: If yes, when and what did the pharmacy advise?)  Is this the correct pharmacy for this prescription? Yes If no, delete pharmacy and type the correct one.  This is the patient's preferred pharmacy:  [facility] [address] [place]), [place] - [id number] Phone: [phone]  Fax: [phone]      Has the prescription been filled recently? Yes  Is the patient out of the medication? Yes  Has the patient been seen for an appointment in the last year OR does the patient have an upcoming appointment? Yes  Can we respond through MyChart? No  Agent: Please be advised that Rx refills may take up to 3 business days. We ask that you follow-up with your pharmacy.

## 2023-08-17 ENCOUNTER — Ambulatory Visit: Payer: Medicaid Other

## 2023-08-21 ENCOUNTER — Ambulatory Visit
Admission: RE | Admit: 2023-08-21 | Discharge: 2023-08-21 | Disposition: A | Discharge status: Home / Self Care | Payer: Medicaid Other | Source: Ambulatory Visit | Attending: Internal Medicine | Admitting: Internal Medicine

## 2023-08-21 DIAGNOSIS — Z1231 Encounter for screening mammogram for malignant neoplasm of breast: Secondary | ICD-10-CM

## 2023-08-22 ENCOUNTER — Ambulatory Visit: Payer: Medicaid Other

## 2023-08-22 NOTE — Progress Notes (Unsigned)
Cardiology Office Note:  .   Date:  08/22/2023  ID:  HENSLEY KASCHAK, DOB 1961/06/14, MRN [id number] PCP: Marcine Matar, MD  [facility] Providers Cardiologist:  Donato Schultz, MD Cardiology APP:  Beatrice Lecher, PA-C  Electrophysiologist:  Lewayne Bunting, MD { Click to update primary MD,subspecialty MD or APP then REFRESH:1}   History of Present Illness: .   Mary [name] is a 62 y.o. female with history of   s/p mechanical value replacement 2014, post op CHF resolved no pacer needed. History of syncope 2020 ILR recommended insurance wouldn't cover. History of TIA 2017 sub therapeutic INR, no CAD on cath 2014.  ROS: ***  Studies Reviewed: [name]         Prior CV Studies: {Select studies to display:26339}   Echocardiogram 12/31/2019 EF 55-60, no R WMA, normal RV SF, mitral valve replacement without evidence of regurgitation, tricuspid valve repair stable   Echocardiogram 03/06/2019 EF > 65, no RWMA, s/p mechanical MVR with normal function, s/p TV repair with trivial TR   Holter 03/30/18 Normal sinus rhythm and sinus tachycardia with average heart rate 109 bpm. Heart rate ranged from 27 to 171 bpm. Intermittent complete heart block with slow ventricular response at 47 bpm during the daytime. SVT at 171 bpm Occasional PVCs and wide-complex tachycardia up to 5 beats   Echocardiogram 02/06/18 EF 55-60, no RWMA, normally functioning mechanical MVR, TV repair functioning normally   Event Monitor 02/24/16 1 episode of high grade heart block at night.  Otherwise, mild sinus tachycardia.     Carotid US 9/17 Bilateral: soft plaque throughout CCA, ICA and ECA. 1-39% ICA plaquing. Vertebral artery flow is antegrade.   Echo 06/06/16 EF 55-60%, mechanical MVR functioning normally, mild RAE, TV repair functioning normally   Echo 11/03/15 Posterior wall mild LVH, EF 55-60%, normal wall motion, mechanical MVR okay, normal RVSF   Echo 01/01/15 Mild focal basal septal hypertrophy, EF  60-65%, normal wall motion, S/P MVR (St Jude mechanical valve) functioning well, mild MR (small mobile echodensity-likely redundant chord versus suture), mild LAE, TV mean gradient 4 mmHg.    Echo 7/15 Mild LVH, EF 60-65%, normal wall motion, grade 1 diastolic dysfunction, S/P MVR (St Jude mechanical valve) and appears functioning well, mild MR, no perivalvular leak, mild LAE, mildly reduced RVSF.    Carotid US 8/14 Bilateral - 1% to 39% ICA stenosis    Cardiac cath 6/14 Left main: No obstructive disease.  Left Anterior Descending Artery:  No obstructive disease noted.   Circumflex Artery:   No obstructive disease.   Right Coronary Artery:  no obstructive disease.  Left Ventricular Angiogram: LVEF 65-70%. 3+ MR Distal Aortogram: No aneurysm. No stenosis.     Risk Assessment/Calculations:   {Does this patient have ATRIAL FIBRILLATION?[phone]} No BP recorded.  {Refresh Note OR Click here to enter BP  :1}***       Physical Exam:   VS:  LMP 01/28/2013    Wt Readings from Last 3 Encounters:  07/17/23 149 lb (67.6 kg)  01/25/23 149 lb 14.6 oz (68 kg)  12/19/22 150 lb (68 kg)    GEN: Well nourished, well developed in no acute distress NECK: No JVD; No carotid bruits CARDIAC: ***RRR, no murmurs, rubs, gallops RESPIRATORY:  Clear to auscultation without rales, wheezing or rhonchi  ABDOMEN: Soft, non-tender, non-distended EXTREMITIES:  No edema; No deformity   ASSESSMENT AND PLAN: .    S/P mitral valve replacement with metallic valve Mechanical mitral valve.  Last INR therapeutic.  Followed closely by Coumadin clinic.  No bleeding.  Echocardiogram 2021 reassuring.  Personally reviewed.   Intermittent complete heart block (HCC) Has been seen in the past by electrophysiology, Dr. Ladona Ridgel who did not feel that pacemaker was necessary in the situation.  Avoiding AV nodal blocking agents such as metoprolol.  Pulse today excellent at 102.  Prior EKG reviewed reassuring.   Warfarin  anticoagulation Continue with close monitoring of INR by pharmacy team.  Notes reviewed.  Last hemoglobin 12.8, creatinine 1.1.  Excellent.       {Are you ordering a CV Procedure (e.g. stress test, cath, DCCV, TEE, etc)?   Press F2        :[id number]}  Dispo: ***  Signed, Jacolyn Reedy, PA-C

## 2023-08-28 ENCOUNTER — Ambulatory Visit: Payer: Medicaid Other | Admitting: Internal Medicine

## 2023-08-31 ENCOUNTER — Ambulatory Visit: Payer: Medicaid Other | Admitting: Internal Medicine

## 2023-09-04 ENCOUNTER — Ambulatory Visit: Payer: Medicaid Other | Attending: Cardiology | Admitting: *Deleted

## 2023-09-04 DIAGNOSIS — Z7901 Long term (current) use of anticoagulants: Secondary | ICD-10-CM

## 2023-09-04 LAB — POCT INR: INR: 3.8 — AB (ref 2.0–3.0)

## 2023-09-04 NOTE — Patient Instructions (Signed)
Description   Today take 1/2 tablet of warfarin then continue taking warfarin 1 tablet daily except for 1.5 tablets Sunday and Thursday.  Continue regular ensure intake (1 per week).  Recheck INR in 4 weeks Coumadin Clinic [phone] or [phone]

## 2023-09-05 ENCOUNTER — Ambulatory Visit: Payer: Medicaid Other | Attending: Physician Assistant | Admitting: Physician Assistant

## 2023-09-15 ENCOUNTER — Other Ambulatory Visit: Payer: Self-pay | Admitting: Internal Medicine

## 2023-09-15 NOTE — Telephone Encounter (Addendum)
Medication Refill -  Most Recent Primary Care Visit:  Provider: Jonah Blue B  Department: [facility]  Visit Type: OFFICE VISIT  Date: 07/17/2023  Medication:  traMADol (ULTRAM) 50 MG tablet   Has the patient contacted their pharmacy? No (Agent: If no, request that the patient contact the pharmacy for the refill. If patient does not wish to contact the pharmacy document the reason why and proceed with request.) (Agent: If yes, when and what did the pharmacy advise?)  Is this the correct pharmacy for this prescription? No If no, delete pharmacy and type the correct one.  This is the patient's preferred pharmacy:  [facility] [address] ([address] [place] - [address] [place] ([place]) [place] [address] Phone: [phone] Fax: [phone]  Has the prescription been filled recently? No  Is the patient out of the medication? Yes  Has the patient been seen for an appointment in the last year OR does the patient have an upcoming appointment? Yes  Can we respond through MyChart? No  Agent: Please be advised that Rx refills may take up to 3 business days. We ask that you follow-up with your pharmacy.

## 2023-09-15 NOTE — Telephone Encounter (Signed)
Requested medication (s) are due for refill today- provider review   Requested medication (s) are on the active medication list -yes  Future visit scheduled -yes  Last refill: 06/21/23 #10  Notes to clinic: non delegated Rx  Requested Prescriptions  Pending Prescriptions Disp Refills   traMADol (ULTRAM) 50 MG tablet 10 tablet 0    Sig: Take 1 tablet (50 mg total) by mouth at bedtime as needed.     Not Delegated - Analgesics:  Opioid Agonists Failed - 09/15/2023  3:18 PM      Failed - This refill cannot be delegated      Passed - Urine Drug Screen completed in last 360 days      Passed - Valid encounter within last 3 months    Recent Outpatient Visits           2 months ago Alcoholic peripheral neuropathy (HCC)   [place] [facility] Dept Of [place]. [facility] Marcine Matar, MD   9 months ago Hospital discharge follow-up   [facility] - A Dept Of [place]. [facility] Marcine Matar, MD   1 year ago Overweight   [place] [facility] [place] - A Dept Of [place]. [facility] Hoy Register, MD   2 years ago Vitamin B12 deficiency   [place] [facility] [place] - A Dept Of [place]. [facility] [place], [place], [place]   2 years ago Vitamin B12 deficiency   [place] [facility] [place] - A Dept Of [place]. [facility] Marcine Matar, MD       Future Appointments             In 2 months Laural Benes Binnie Rail, MD [facility] [place] - A Dept Of Eligha Bridegroom. [facility]               Requested Prescriptions  Pending Prescriptions Disp Refills   traMADol (ULTRAM) 50 MG tablet 10 tablet 0    Sig: Take 1 tablet (50 mg total) by mouth at bedtime as needed.     Not Delegated - Analgesics:  Opioid Agonists Failed - 09/15/2023  3:18 PM      Failed - This refill cannot be delegated      Passed - Urine Drug Screen completed in last 360 days       Passed - Valid encounter within last 3 months    Recent Outpatient Visits           2 months ago Alcoholic peripheral neuropathy (HCC)   [place] [facility] - A Dept Of [place]. [facility] Marcine Matar, MD   9 months ago Hospital discharge follow-up   [facility] Pr[facility] - A Dept Of [place]. [facility] Marcine Matar, MD   1 year ago Overweight   [place] [facility] [place] - A Dept Of [place]. [facility] Hoy Register, MD   2 years ago Vitamin B12 deficiency   [place] [facility] [place] - A Dept Of [place]. [facility] [place], [place], [place]   2 years ago Vitamin B12 deficiency   [place] [facility] [place] - A Dept Of . [facility] Marcine Matar, MD       Future Appointments  In 2 months Marcine Matar, MD [facility] [place] - A Dept Of Eligha Bridegroom. [place] [facility]

## 2023-09-21 ENCOUNTER — Other Ambulatory Visit: Payer: Self-pay | Admitting: Cardiology

## 2023-09-21 ENCOUNTER — Other Ambulatory Visit: Payer: Self-pay | Admitting: Family Medicine

## 2023-09-21 DIAGNOSIS — Z954 Presence of other heart-valve replacement: Secondary | ICD-10-CM

## 2023-10-02 ENCOUNTER — Ambulatory Visit: Payer: Medicaid Other

## 2023-10-10 ENCOUNTER — Telehealth: Payer: Self-pay | Admitting: Internal Medicine

## 2023-10-10 NOTE — Telephone Encounter (Signed)
 Copied from CRM [id number]. Topic: General - Other >> Oct 09, 2023  4:29 PM [place] B wrote: Reason for CRM: pt called in states need letter for housing.,, says its urgent

## 2023-10-11 ENCOUNTER — Ambulatory Visit: Payer: Medicaid Other | Attending: Cardiovascular Disease

## 2023-10-11 DIAGNOSIS — Z7901 Long term (current) use of anticoagulants: Secondary | ICD-10-CM | POA: Diagnosis not present

## 2023-10-11 LAB — POCT INR: INR: 1.5 — AB (ref 2.0–3.0)

## 2023-10-11 NOTE — Patient Instructions (Signed)
 Description   Take 2 tablets today and 2 tablets tomorrow and then resume taking warfarin 1 tablet daily except for 1.5 tablets Sunday and Thursday.  Continue regular ensure intake (1 per week).  Recheck INR in 1 week Coumadin  Clinic [phone]

## 2023-10-11 NOTE — Telephone Encounter (Addendum)
 This does not provide me with any information. Call patient and find out the details.

## 2023-10-11 NOTE — Telephone Encounter (Signed)
 Called but no answer. LVM to call back for further information.

## 2023-10-12 NOTE — Telephone Encounter (Signed)
Called & spoke to the patient. Verified name & DOB. Patient stated that she is about to be evicted and her eviction date is for 10/25/2023. She is requesting a letter stating that she needs an extension at her current apartment complex until she can get into housing. Additionally she needs a letter for housing stating that due to her medical condition that includes neuropathy, stroke and other issues she will need for her place in the housing list to be rushed so that she is not without a home during this winter season. She would like to try and get a place with housing before 10/25/2023.  Patient stated that she is able to pick up the letter whenever it is ready. Please advise.

## 2023-10-16 NOTE — Telephone Encounter (Signed)
Spoke with patient .patient requesting to speak with Robyne Peers, CM. Patient voiced that she spoke with Erskine Squibb on Friday and was instructed to call back on  Monday if she had not heard back from Legal aid. Patient voiced that she needed to have something in place by 10/25/2023 before her count date.  Advised that I would Let Erskine Squibb know.

## 2023-10-16 NOTE — Telephone Encounter (Signed)
Pt calling in to speak to [facility]. Pt requesting callback, [phone].

## 2023-10-16 NOTE — Telephone Encounter (Signed)
I called the patient and explained to her that [facility] has received the referral however, I am not sure if they are working today due to it being a holiday.  She said she understood and will call me back on 10/18/2023 if she has not heard from anyone.   I sent a message to Shella Maxim, [facility] noting the patient is anxious about hearing from them

## 2023-10-17 NOTE — Telephone Encounter (Signed)
Message received from [facility], [employer] stating they are assisting the patient and the attorney has been give her file.

## 2023-10-18 ENCOUNTER — Ambulatory Visit: Payer: Medicaid Other

## 2023-10-25 ENCOUNTER — Ambulatory Visit: Payer: Medicaid Other

## 2023-10-26 ENCOUNTER — Other Ambulatory Visit: Payer: Self-pay | Admitting: Internal Medicine

## 2023-10-26 DIAGNOSIS — K219 Gastro-esophageal reflux disease without esophagitis: Secondary | ICD-10-CM

## 2023-10-26 MED ORDER — PANTOPRAZOLE SODIUM 40 MG PO TBEC: 40.0000 mg | DELAYED_RELEASE_TABLET | Freq: Two times a day (BID) | ORAL | 0 refills | Status: DC

## 2023-10-26 NOTE — Telephone Encounter (Signed)
Requested by interface surescripts. Future visit in 3 weeks.  Requested Prescriptions  Pending Prescriptions Disp Refills   pantoprazole (PROTONIX) 40 MG tablet 180 tablet 0    Sig: Take 1 tablet (40 mg total) by mouth 2 (two) times daily before a meal.     Gastroenterology: Proton Pump Inhibitors Passed - 10/26/2023  2:13 PM      Passed - Valid encounter within last 12 months    Recent Outpatient Visits           3 months ago Alcoholic peripheral neuropathy (HCC)   [place] [facility] - A Dept Of [place]. [facility] Marcine Matar, MD   10 months ago Hospital discharge follow-up   [facility] Proud - A Dept Of [place]. [facility] Marcine Matar, MD   1 year ago Overweight   [place] [facility] [place] - A Dept Of [place]. [facility] Hoy Register, MD   2 years ago Vitamin B12 deficiency   [place] [facility] [place] - A Dept Of [place]. [facility] [place], [place], [place]   2 years ago Vitamin B12 deficiency   [place] [facility] [place] - A Dept Of [place]. [facility] Marcine Matar, MD       Future Appointments             In 3 weeks Marcine Matar, MD [facility] [place] - A Dept Of Eligha Bridegroom. [facility]

## 2023-10-26 NOTE — Telephone Encounter (Signed)
Copied from CRM [id number]. Topic: Clinical - Medication Refill >> Oct 26, 2023  9:05 AM Desma Mcgregor wrote: Most Recent Primary Care Visit:  Provider: Jonah Blue B  Department: [facility]  Visit Type: OFFICE VISIT  Date: 07/17/2023  Medication: pantoprazole (PROTONIX) 40 MG tablet  Has the patient contacted their pharmacy? No, but rx shows no refills.  Is this the correct pharmacy for this prescription? Yes If no, delete pharmacy and type the correct one.  This is the patient's preferred pharmacy:  [facility] [address] ([address] [place] - [address] [place] ([place]) [place] [address] Phone: [phone] Fax: [phone]  Has the prescription been filled recently? Yes  Is the patient out of the medication? Yes  Has the patient been seen for an appointment in the last year OR does the patient have an upcoming appointment? Yes  Can we respond through MyChart? No  Agent: Please be advised that Rx refills may take up to 3 business days. We ask that you follow-up with your pharmacy.

## 2023-10-30 ENCOUNTER — Ambulatory Visit: Payer: Medicaid Other | Attending: Cardiovascular Disease | Admitting: *Deleted

## 2023-10-30 DIAGNOSIS — Z7901 Long term (current) use of anticoagulants: Secondary | ICD-10-CM | POA: Diagnosis not present

## 2023-10-30 LAB — POCT INR: INR: 2.1 (ref 2.0–3.0)

## 2023-10-30 NOTE — Patient Instructions (Addendum)
Description   Today take 1.5 tablets of warfarin then continue taking warfarin 1 tablet daily except for 1.5 tablets Sunday and Thursday.  Continue regular ensure intake (1 per week).  Recheck INR in 2 weeks.  Coumadin Clinic [phone]

## 2023-11-14 ENCOUNTER — Ambulatory Visit: Payer: Medicaid Other | Attending: Cardiovascular Disease | Admitting: *Deleted

## 2023-11-14 DIAGNOSIS — Z7901 Long term (current) use of anticoagulants: Secondary | ICD-10-CM | POA: Diagnosis not present

## 2023-11-14 LAB — POCT INR: INR: 2.2 (ref 2.0–3.0)

## 2023-11-14 NOTE — Patient Instructions (Addendum)
Description   Today take 1.5 tablets of warfarin then START taking warfarin 1 tablet daily except for 1.5 tablets Sunday, Tuesday, and Thursday.  Continue regular ensure intake (1 per week).  Recheck INR in 2 weeks.  Coumadin Clinic [phone]

## 2023-11-17 ENCOUNTER — Other Ambulatory Visit: Payer: Self-pay | Admitting: Internal Medicine

## 2023-11-17 DIAGNOSIS — K219 Gastro-esophageal reflux disease without esophagitis: Secondary | ICD-10-CM

## 2023-11-21 ENCOUNTER — Telehealth: Payer: Self-pay

## 2023-11-21 ENCOUNTER — Encounter: Payer: Self-pay | Admitting: Internal Medicine

## 2023-11-21 ENCOUNTER — Ambulatory Visit: Payer: Medicaid Other | Attending: Internal Medicine | Admitting: Internal Medicine

## 2023-11-21 VITALS — BP 116/84 | HR 102 | Ht 62.0 in | Wt 148.0 lb

## 2023-11-21 DIAGNOSIS — R0602 Shortness of breath: Secondary | ICD-10-CM

## 2023-11-21 DIAGNOSIS — N1832 Chronic kidney disease, stage 3b: Secondary | ICD-10-CM | POA: Diagnosis not present

## 2023-11-21 DIAGNOSIS — I38 Endocarditis, valve unspecified: Secondary | ICD-10-CM

## 2023-11-21 DIAGNOSIS — F1021 Alcohol dependence, in remission: Secondary | ICD-10-CM | POA: Diagnosis not present

## 2023-11-21 DIAGNOSIS — G629 Polyneuropathy, unspecified: Secondary | ICD-10-CM | POA: Diagnosis not present

## 2023-11-21 DIAGNOSIS — I5032 Chronic diastolic (congestive) heart failure: Secondary | ICD-10-CM | POA: Diagnosis not present

## 2023-11-21 DIAGNOSIS — R1312 Dysphagia, oropharyngeal phase: Secondary | ICD-10-CM | POA: Diagnosis not present

## 2023-11-21 DIAGNOSIS — K219 Gastro-esophageal reflux disease without esophagitis: Secondary | ICD-10-CM

## 2023-11-21 MED ORDER — PANTOPRAZOLE SODIUM 40 MG PO TBEC
40.0000 mg | DELAYED_RELEASE_TABLET | Freq: Two times a day (BID) | ORAL | 0 refills | Status: DC
Start: 2023-11-21 — End: 2024-04-18

## 2023-11-21 MED ORDER — FOLIC ACID 1 MG PO TABS: 1.0000 mg | ORAL_TABLET | Freq: Every day | ORAL | 0 refills | Status: DC

## 2023-11-21 NOTE — Telephone Encounter (Addendum)
 I met with the patient when she was in the clinic today and she explained may difficulties that she is facing.   She has been working with attorneys from [employer] [place] [facility])  to address an eviction notice that she received.  She said she has been to court, they extended the day she needs to be out of her home and it now set at 11/27/2023.  She explained that her son has been living with her for 2 years and he up and left her  He had been helping her pay the rent and now she is not able to manage it on her own. She said her monthly income is about $47 which she thinks is from [place] disability.  Her rent was about $600/month and it is now more than $800/month. She was not sure how much she owes [facility] but said it is over $1400.  She asked that I contact Attorneys: Donavan Burnet and Casimiro Needle ( she is not sure of his last name) from [facility] for more specific information about the eviction.   She explained that she would like a 1 bedroom apartment that is in a nice, safe area that she can afford. I told her that I will contact [facility] but I do not have information about rentals that would guarantee her housing before 11/27/2023   She said she has contacted Ms Arne Cleveland at the [employer] about rental assistance and that has not panned out as she expected. She has also been to Self- Help to request rent assistance but has not received any assistance yet. In addition, she has applied for [facility] for assistance with her power bill.  She went on to say that [employer] shut off her electricity around 09/10/2023 and she has been out of power since then.  She said that her son left after the power was turned off and he gave her a candle and some food.  She is quite upset how their relationship has evolved. I asked if she contacted [employer] about a payment plan and she said she is not eligible for one.  She currently owes [employer]  $1530.39.  I told her that I will call [employer] with her tomorrow to  inquire about an extension since she did not have her account number with her.    She said she has not contacted [employer] or the [employer] and I told her that I can contact them for her to inquire if they have any resources for her.    She was also in agreement for placing a referral to [facility] for assistance and I told her that I would also place that referral.   While she was in the clinic we completed an [employer] [place] application

## 2023-11-21 NOTE — Progress Notes (Signed)
 Patient ID: Mary [name], female    DOB: 10-03-1960  MRN: [id number]  CC: Follow-up (Follow-up. /Intermittent episodes of SOB X2 mo /No to pneumonia vax. Yes to pap. )   Subjective: Mary [name] is a 63 y.o. female who presents for chronic ds management. Her concerns today include:  Patient with history of VHD (mechanical mitral valve and tricuspid repair 2014 on chronic Coumadin), CHF with preserved EF,history of SVT, EtOH abuse, alcohol induced neuropathy and myopathy, macrocytic anemia, documented B12 and thiamine def, erosive gastritis, pyloric stenosis,   Discussed the use of AI scribe software for clinical note transcription with the patient, who gave verbal consent to proceed.  History of Present Illness   The patient, with a history of heart failure and valve replacement, presents with intermittent shortness of breath for the past two months. She notices it both at rest and with exertion. She attributes it to aging and recent weight gain, as she has increased from 110-115 lbs to 150 lbs over the past few years. She reports a stable weight over the past year, fluctuating between 148-152 lbs. She has been sleeping on four pillows since her heart surgery in 2014. She denies any nocturnal dyspnea or chest pain.  No lower extremity edema or recent long distance travel.  She is on Coumadin for her heart valves.  Most recent INR was 2.2.  She has not seen her cardiologist recently and is unsure if she has a follow-up appointment scheduled.  She endorses cough particularly at night with occasional wheezing.  She also reports episodes of choking on food, water, and saliva, which causes her to cough.  She has a history of smoking from age 25 to 8, with a pack of cigarettes lasting her about three days which puts her at 11-pack-year history. She quit smoking in 2014.  ETOH Use:  She has a history of alcohol use but has significantly reduced her intake to occasional fruity alcoholic drinks.  She is currently taking pantoprazole twice daily for acid reflux, B12 supplement daily for B12 def, and gabapentin for neuropathy.   She has a history of housing instability and is currently seeking a ground floor apartment due to difficulty with stairs. She is working with legal aid to resolve her housing issues.     We have been keeping an eye on her kidney function.  GFR previously was in the 40-50s.  However on last visit it had dec to 34.  She is not on NSAIDs. 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   Pharyngitis 12/06/2022   Neuropathy 12/06/2022   Tachycardia 12/05/2022   Supratherapeutic INR 12/05/2022   Hyperkalemia 12/05/2022   Hypotension due to hypovolemia 07/19/2022   Prolonged QT interval 07/19/2022   Marijuana user 12/24/2020   LFT elevation    Acquired pyloric stenosis    GI bleed 11/24/2020   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   Thiamin deficiency 09/01/2020   Paresthesia    Vitamin B12 deficiency 05/04/2020   Vomiting 04/24/2020   GERD (gastroesophageal reflux disease)    Marijuana abuse    Macrocytic anemia 04/04/2019   Intermittent complete heart block (HCC)    Syncope 03/28/2019   Orthostatic hypotension 01/11/2017   Elevated liver enzymes 01/11/2017   Atypical chest pain 06/05/2016   Acute renal failure superimposed on  stage 3a chronic kidney disease (HCC) 01/10/2016   Elevated INR 01/10/2016   TIA (transient ischemic attack) 09/13/2015   CVA (cerebral vascular accident) (HCC) 09/12/2015   SOB (shortness of breath) 02/24/2014   First degree heart block 06/17/2013   (HFpEF) heart failure with preserved ejection fraction (HCC) 06/16/2013   History of bacterial endocarditis 06/16/2013   Warfarin anticoagulation 06/10/2013   S/P mitral valve  replacement with metallic valve 05/23/2013   Femoral hernia 05/23/2013   Mitral valve insufficiency 04/22/2013   Tricuspid regurgitation 04/22/2013   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   SVT (supraventricular tachycardia) (HCC) 03/16/2013     Current Outpatient Medications on File Prior to Visit  Medication Sig Dispense Refill   acetaminophen (TYLENOL) 500 MG tablet Take 1,000 mg by mouth in the morning, at noon, and at bedtime.     gabapentin (NEURONTIN) 300 MG capsule Take 1 capsule (300 mg total) by mouth 3 (three) times daily. 270 capsule 2   latanoprost (XALATAN) 0.005 % ophthalmic solution Place 1 drop into both eyes at bedtime.     thiamine (VITAMIN B1) 100 MG tablet Take 1 tablet (100 mg total) by mouth daily. 100 tablet 2   traMADol (ULTRAM) 50 MG tablet Take 1 tablet (50 mg total) by mouth at bedtime as needed. 10 tablet 0   warfarin (COUMADIN) 5 MG tablet TAKE 1 TO 1 & 1/2 (ONE TO ONE & ONE-HALF) TABLETS BY MOUTH ONCE DAILY AS DIRECTED BY  COUMADIN  CLINIC 115 tablet 0   No current facility-administered medications on file prior to visit.    Allergies  Allergen Reactions   Ibuprofen Itching   Oxycodone Itching    Pt states no longer makes her itchy   Tape Itching, Rash and Other (See Comments)    THE ONLY TAPE THAT IS TOLERATED IS PAPER TAPE. EKG leads inflame the skin    Social History   Socioeconomic History   Marital status: Single    Spouse name: Not on file   Number of children: Not on file   Years of education: Not on file   Highest education level: Not on file  Occupational History   Occupation: unemployed  Tobacco Use   Smoking status: Former    Current packs/day: 0.00    Average packs/day: 0.5 packs/day for 46.0 years (23.0 ttl pk-yrs)    Types: Cigarettes    Start date: 31    Quit date: 2022    Years since quitting: 3.1   Smokeless tobacco: Never  Vaping Use   Vaping status: Never Used  Substance and Sexual Activity    Alcohol use: Not Currently    Comment:  "glass of wine"   Drug use: Yes    Types: Marijuana    Comment: chews on it periodically   Sexual activity: Never  Other Topics Concern   Not on file  Social History Narrative   Right handed   Two story home apartment    Social Drivers of Health   Financial Resource Strain: Not on file  Food Insecurity: No Food Insecurity (01/24/2023)   Hunger Vital Sign    Worried About Running Out of Food in the Last Year: Never true    Ran Out of Food in the Last Year: Never true  Transportation Needs: No Transportation Needs (01/24/2023)   PRAPARE - Administrator, Civil Service (Medical): No    Lack of Transportation (Non-Medical): No  Physical Activity: Not on file  Stress: Not on file  Social Connections: Not on file  Intimate Partner Violence: Not At Risk (01/24/2023)   Humiliation, Afraid, Rape, and Kick questionnaire    Fear of Current or Ex-Partner: No    Emotionally Abused: No    Physically Abused: No    Sexually Abused: No    Family History  Problem Relation Age of Onset   Kidney disease Mother        Had one Kidney removed   Stroke Father    Cancer Father    Sarcoidosis Sister    Heart attack Neg Hx    Colon cancer Neg Hx    Stomach cancer Neg Hx    Pancreatic cancer Neg Hx    Esophageal cancer Neg Hx     Past Surgical History:  Procedure Laterality Date   APPENDECTOMY  ~1978   BALLOON DILATION N/A 11/27/2020   Procedure: BALLOON DILATION;  Surgeon: Iva Boop, MD;  Location: [facility] ENDOSCOPY;  Service: Endoscopy;  Laterality: N/A;   BIOPSY  05/01/2020   Procedure: BIOPSY;  Surgeon: Rachael Fee, MD;  Location: [facility] ENDOSCOPY;  Service: Endoscopy;;   CARDIAC CATHETERIZATION     CARDIAC VALVE REPLACEMENT     CESAREAN SECTION  1983   CESAREAN SECTION WITH BILATERAL TUBAL LIGATION  1999   COLONOSCOPY WITH PROPOFOL N/A 05/01/2020   Procedure: COLONOSCOPY WITH PROPOFOL;  Surgeon: Rachael Fee, MD;  Location: [facility]  ENDOSCOPY;  Service: Endoscopy;  Laterality: N/A;   ESOPHAGOGASTRODUODENOSCOPY (EGD) WITH PROPOFOL N/A 05/01/2020   Procedure: ESOPHAGOGASTRODUODENOSCOPY (EGD) WITH PROPOFOL;  Surgeon: Rachael Fee, MD;  Location: [facility] ENDOSCOPY;  Service: Endoscopy;  Laterality: N/A;   ESOPHAGOGASTRODUODENOSCOPY (EGD) WITH PROPOFOL N/A 11/27/2020   Procedure: ESOPHAGOGASTRODUODENOSCOPY (EGD) WITH PROPOFOL;  Surgeon: Iva Boop, MD;  Location: [facility] ENDOSCOPY;  Service: Endoscopy;  Laterality: N/A;   FEMORAL HERNIA REPAIR Right 05/23/2013   Procedure: HERNIA REPAIR FEMORAL;  Surgeon: Purcell Nails, MD;  Location: [facility] OR;  Service: Open Heart Surgery;  Laterality: Right;   INTRAOPERATIVE TRANSESOPHAGEAL ECHOCARDIOGRAM N/A 05/23/2013   Procedure: INTRAOPERATIVE TRANSESOPHAGEAL ECHOCARDIOGRAM;  Surgeon: Purcell Nails, MD;  Location: [facility] OR;  Service: Open Heart Surgery;  Laterality: N/A;   LAPAROSCOPIC CHOLECYSTECTOMY  2003   LEFT AND RIGHT HEART CATHETERIZATION WITH CORONARY ANGIOGRAM N/A 03/22/2013   Procedure: LEFT AND RIGHT HEART CATHETERIZATION WITH CORONARY ANGIOGRAM;  Surgeon: Kathleene Hazel, MD;  Location: [facility] CATH LAB;  Service: Cardiovascular;  Laterality: N/A;   MINIMALLY INVASIVE TRICUSPID VALVE REPAIR Right 05/23/2013   Procedure: MINIMALLY INVASIVE TRICUSPID VALVE REPAIR;  Surgeon: Purcell Nails, MD;  Location: [facility] OR;  Service: Open Heart Surgery;  Laterality: Right;   MITRAL VALVE REPLACEMENT N/A 05/23/2013   Procedure: MITRAL VALVE (MV) REPLACEMENT;  Surgeon: Purcell Nails, MD;  Location: [facility] OR;  Service: Open Heart Surgery;  Laterality: N/A;   MULTIPLE EXTRACTIONS WITH ALVEOLOPLASTY N/A 04/04/2013   Procedure: Extraction of tooth #'s 1,8,9,13,14,15,23,24,25,26 with alveoloplasty and gross debridement of remaining teeth;  Surgeon: Charlynne Pander, DDS;  Location: [facility] OR;  Service: Oral Surgery;  Laterality: N/A;   TEE WITHOUT CARDIOVERSION N/A 03/18/2013   Procedure: TRANSESOPHAGEAL  ECHOCARDIOGRAM (TEE);  Surgeon: Laurey Morale, MD;  Location: [facility] ENDOSCOPY;  Service: Cardiovascular;  Laterality: N/A;   TEE WITHOUT CARDIOVERSION N/A 06/17/2013   Procedure: TRANSESOPHAGEAL ECHOCARDIOGRAM (TEE);  Surgeon: Lewayne Bunting, MD;  Location: [facility] ENDOSCOPY;  Service: Cardiovascular;  Laterality: N/A;   TUBAL LIGATION  1999    ROS: Review of Systems Negative except  as stated above  PHYSICAL EXAM: BP 116/84 (BP Location: Left Arm, Patient Position: Sitting, Cuff Size: Normal)   Pulse (!) 102   Ht 5\' 2"  (1.575 m)   Wt 148 lb (67.1 kg)   LMP 01/28/2013   SpO2 96%   BMI 27.07 kg/m   Wt Readings from Last 3 Encounters:  11/21/23 148 lb (67.1 kg)  07/17/23 149 lb (67.6 kg)  01/25/23 149 lb 14.6 oz (68 kg)    Physical Exam  General appearance - alert, well appearing, older African-American female and in no distress Mental status - normal mood, behavior, speech, dress, motor activity, and thought processes Chest - clear to auscultation, no wheezes, rales or rhonchi, symmetric air entry Heart -I am able to hear the clicks from her mechanical valve.  She is regular rhythm.  No significant JVD appreciated.   Extremities - peripheral pulses normal, no lower extremity edema.        Latest Ref Rng & Units 07/17/2023   12:08 PM 01/25/2023    7:22 AM 01/24/2023   12:45 PM  CMP  Glucose 70 - 99 mg/dL 604  79  540   BUN 8 - 27 mg/dL 16  9  12    Creatinine 0.57 - 1.00 mg/dL 9.81  1.91  4.78   Sodium 134 - 144 mmol/L 141  138  138   Potassium 3.5 - 5.2 mmol/L 5.0  3.9  4.3   Chloride 96 - 106 mmol/L 104  112  111   CO2 20 - 29 mmol/L 19  22  21    Calcium 8.7 - 10.3 mg/dL 9.4  8.4  8.2   Total Protein 6.0 - 8.5 g/dL 7.1   6.4   Total Bilirubin 0.0 - 1.2 mg/dL 0.2   0.5   Alkaline Phos 44 - 121 IU/L 118   83   AST 0 - 40 IU/L 19   20   ALT 0 - 32 IU/L 9   10    Lipid Panel     Component Value Date/Time   CHOL 189 05/31/2018 1108   TRIG 300 (H) 05/31/2018 1108   HDL 68  05/31/2018 1108   CHOLHDL 2.8 05/31/2018 1108   CHOLHDL 3.2 06/06/2016 0135   VLDL 22 06/06/2016 0135   LDLCALC 61 05/31/2018 1108    CBC    Component Value Date/Time   WBC 9.9 07/17/2023 1208   WBC 6.7 01/25/2023 0722   RBC 3.76 (L) 07/17/2023 1208   RBC 2.97 (L) 01/25/2023 0722   HGB 12.1 07/17/2023 1208   HCT 37.5 07/17/2023 1208   PLT 406 07/17/2023 1208   MCV 100 (H) 07/17/2023 1208   MCH 32.2 07/17/2023 1208   MCH 33.0 01/25/2023 0722   MCHC 32.3 07/17/2023 1208   MCHC 31.3 01/25/2023 0722   RDW 13.7 07/17/2023 1208   LYMPHSABS 1.3 01/24/2023 1013   LYMPHSABS 1.9 06/26/2020 1218   MONOABS 0.6 01/24/2023 1013   EOSABS 0.2 01/24/2023 1013   EOSABS 0.2 06/26/2020 1218   BASOSABS 0.0 01/24/2023 1013   BASOSABS 0.0 06/26/2020 1218    ASSESSMENT AND PLAN: 1. SOB (shortness of breath) (Primary) Patient with history of diastolic CHF and valvular heart disease with mechanical mitral valve presenting with shortness of breath at rest on exertion over the past 2 months.  She has not had any significant weight changes since her last visit with me several months ago and on exam does not appear to be fluid overloaded.  Will check a  BNP nonetheless and get a chest x-ray.  Will try to get her back in with cardiology as soon as possible. -Check CBC to evaluate for any worsening anemia -PE less likely as she is on Coumadin. - Brain natriuretic peptide - DG Chest 2 View; Future - Ambulatory referral to Cardiology  2. Diastolic CHF, chronic (HCC) See #1 above - Brain natriuretic peptide - Basic Metabolic Panel - Ambulatory referral to Cardiology - CBC  3. VHD (valvular heart disease) - Ambulatory referral to Cardiology  4. Alcohol use disorder, moderate, in early remission (HCC) Commended her on reducing her alcohol intake significantly.  Encouraged her to try to quit completely to prevent going down a slippery slope again.  5. Gastroesophageal reflux disease without  esophagitis Refill sent on pantoprazole. - pantoprazole (PROTONIX) 40 MG tablet; Take 1 tablet (40 mg total) by mouth 2 (two) times daily before a meal.  Dispense: 180 tablet; Refill: 0  6. Neuropathy Due to B12 deficiency and EtOH neuropathy.  Continue B12 supplement and gabapentin - folic acid (FOLVITE) 1 MG tablet; Take 1 tablet (1 mg total) by mouth daily.  Dispense: 90 tablet; Refill: 0  7. CKD stage 3b, GFR 30-44 ml/min (HCC) On last visit with me, her GFR had worsened from the 50s to the 30s.  I referred her to nephrology but patient states she has not received a call.  Message sent to our referral coordinator on this.  8. Oropharyngeal dysphagia - Ambulatory referral to Gastroenterology  Patient was given the opportunity to ask questions.  Patient verbalized understanding of the plan and was able to repeat key elements of the plan.   This documentation was completed using Paediatric nurse.  Any transcriptional errors are unintentional.  Orders Placed This Encounter  Procedures   DG Chest 2 View   Brain natriuretic peptide   Basic Metabolic Panel   CBC   Ambulatory referral to Gastroenterology   Ambulatory referral to Cardiology     Requested Prescriptions   Signed Prescriptions Disp Refills   pantoprazole (PROTONIX) 40 MG tablet 180 tablet 0    Sig: Take 1 tablet (40 mg total) by mouth 2 (two) times daily before a meal.   folic acid (FOLVITE) 1 MG tablet 90 tablet 0    Sig: Take 1 tablet (1 mg total) by mouth daily.    Return in about 4 weeks (around 12/19/2023) for PAP.  Jonah Blue, MD, FACP

## 2023-11-22 LAB — CBC
Hematocrit: 34.8 % (ref 34.0–46.6)
Hemoglobin: 11.2 g/dL (ref 11.1–15.9)
MCH: 31.2 pg (ref 26.6–33.0)
MCHC: 32.2 g/dL (ref 31.5–35.7)
MCV: 97 fL (ref 79–97)
Platelets: 353 10*3/uL (ref 150–450)
RBC: 3.59 x10E6/uL — ABNORMAL LOW (ref 3.77–5.28)
RDW: 13.1 % (ref 11.7–15.4)
WBC: 8.6 10*3/uL (ref 3.4–10.8)

## 2023-11-22 LAB — BASIC METABOLIC PANEL
BUN/Creatinine Ratio: 12 (ref 12–28)
BUN: 17 mg/dL (ref 8–27)
CO2: 21 mmol/L (ref 20–29)
Calcium: 9.8 mg/dL (ref 8.7–10.3)
Chloride: 108 mmol/L — ABNORMAL HIGH (ref 96–106)
Creatinine, Ser: 1.43 mg/dL — ABNORMAL HIGH (ref 0.57–1.00)
Glucose: 88 mg/dL (ref 70–99)
Potassium: 4.8 mmol/L (ref 3.5–5.2)
Sodium: 145 mmol/L — ABNORMAL HIGH (ref 134–144)
eGFR: 42 mL/min/{1.73_m2} — ABNORMAL LOW (ref >59)

## 2023-11-22 LAB — BRAIN NATRIURETIC PEPTIDE: BNP: 253.7 pg/mL — ABNORMAL HIGH (ref 0.0–100.0)

## 2023-11-22 NOTE — Telephone Encounter (Signed)
 I called [facility] and left a message for Maretta Bees: [phone] requesting a call back.  I also called the [facility]ability [facility]acy [facility]: [phone] x [phone] and left a message for [facility] requesting a call back.   Secure email sent to Donavan Burnet, Attorney/LANC inquiring if she has any information to share about the patient's eviction status and if there is anything we can do to support the patient.  Voicemail message left for Shella Maxim, Attorney/ [facility] requesting a call back. Informed her that I would like to speak to [place] or Casimiro Needle.

## 2023-11-22 NOTE — Telephone Encounter (Signed)
 I placed a conference call to [employer]: [phone] with the patient on the line.  We spoke to [facility] and she confirmed that the patient has a balance of $1530.39 and the power has been disconnected since 08/2023.  Mary [name] explained that because the power has already been disconnected, the entire bill must be paid to have the service restored. There is no option for a payment plan at this time.  The patient said she understood.   Account # [id number]

## 2023-11-22 NOTE — Telephone Encounter (Signed)
 I spoke to the patient and informed her of the individuals/agencies that I reached out to today but noted that I have not hard back from anyone yet.  She explained that she spoke to [employer] who is a IT trainer who comes out to her house to " assist " her.   She said he is requesting a letter for Ms Lovett/ [employer] stating that she ( patient ) " can move forward with her residence."  She could not offer any more specific information.  I asked that she get the numbers or Francesco Sor and Ms Arne Cleveland so I can call them to clarify what is needed.  She said she will get the numbers from her phone and will provide me with the numbers tomorrow.

## 2023-11-23 ENCOUNTER — Other Ambulatory Visit: Payer: Self-pay | Admitting: Internal Medicine

## 2023-11-23 DIAGNOSIS — I5032 Chronic diastolic (congestive) heart failure: Secondary | ICD-10-CM

## 2023-11-23 DIAGNOSIS — R0602 Shortness of breath: Secondary | ICD-10-CM

## 2023-11-23 DIAGNOSIS — I38 Endocarditis, valve unspecified: Secondary | ICD-10-CM

## 2023-11-23 NOTE — Telephone Encounter (Signed)
 I received a call from Mary [name], Attorney/ [employer] [facility] [place].  She explained that Mary [name] is still working with the patient regarding the eviction and she has a court date on 11/29/2023.  She said he is also trying to help with resources for rental assistance.  Mary [name] said she will reach out to him today to inquire about the status of obtaining assistance for her rent.   I explained to Mary [name] that the patient's power has been out since December 2024. Depending on the status of the eviction, we may need to check other resources for assistance with restoring her power. I have contacted [employer] and was informed that the power cannot be restored until she pays off her entire balance.   I explained to Mary [name] that the patient stated she only receives about $600/month from [place]. With this limited income, she is not able to pay rent and utilities. Mary [name] stated that Mary [name], attorney/LANC had assisted the patient with her disability application. I also told Mary [name] that I am not sure how much she receives in food stamps.  Mary [name] explained that she will ask Mary [name], from their office to reach out to the patient about the disability and food stamps and see how they may be able to assist her.  I called the patient and informed her that Mary [name] will be contacting her this afternoon to see if they can determine why she is only receiving $600/month from [facility] and not the full $ 943/month .  I also told her that [facility] wants to make sure she is receiving food stamps  The patient confirmed that she receives $200+/month of food stamps.    She also said that she received a call from [facility] and he told her that Mary [name] informed him that they no longer have funding available to assist her with her rent. Mary [name] can be reached at [phone]. She also provided me with Mary [name]'s # [phone].    I tried to reach Mary [name] and had to leave a message requesting a call back    I also tried to reach Mary [name], [facility] and had to leave a message requesting a call back.

## 2023-11-23 NOTE — Telephone Encounter (Signed)
 Call received from Vania Rea, [facility] Navigator/ [employer].  He explained that he has been working with the patient since October 2024 to address her needs for housing, rental assistance, utility assistance.  He made multiple home visits to assist the patient with completing applications for resources.  He has been in contact with Michael/ [facility] and is aware of the status of a possible eviction.  Francesco Sor stated he has the patient on the wait list for housing with the [employer].  He said that he was able to secure $1100 from a local non profit to help with some of the outstanding rent but she still owes about $ 2000.    She has already used her funding allowance from [employer]. He has been working with the [employer] and stated they do not have funding available to assist the patient.    We discussed the fact that even if she gets the outstanding bills paid off, she will not be able to afford her rent going forward which is now $875/month.    He explained that he recommend to the patient that she  obtain a letter from one of he sons explaining that they will help her with rent payments for a certain number of months and that the landlord may accept this and allow her to stay her her apartment while she looks for other housing options.   Francesco Sor said that he has been working with the case managers that oversee her case with [employer] and he feels he has exhausted the resources available for the patient but he will continue to follow her and assist her as he can.  I explained to him and I am also working with [facility] and have tried to contact other community agencies for support.  He stated that he will contact me if he has any new information to share.

## 2023-11-23 NOTE — Progress Notes (Signed)
 Let patient know that blood cell count is within normal range.  Kidney function is not at 100% but has improved compared to when last checked in October. I will order an echocardiogram to check valve and heart function given her symptoms of shortness of breath.  Please schedule for pt.  Order placed.

## 2023-11-24 ENCOUNTER — Telehealth: Payer: Self-pay

## 2023-11-24 NOTE — Progress Notes (Signed)
 Complex Care Management Note Care Guide Note  11/24/2023 Name: RENESHIA ZUCCARO MRN: [id number] DOB: [birth year]  [place] JORDANE HISLE is a 63 y.o. year old female who is a primary care patient of Marcine Matar, MD . The community resource team was consulted for assistance with  housing.  SDOH screenings and interventions completed:  Yes  Social Drivers of Health From This Encounter   Food Insecurity: No Food Insecurity (11/24/2023)   Hunger Vital Sign    Worried About Running Out of Food in the Last Year: Never true    Ran Out of Food in the Last Year: Never true  Housing: High Risk (11/24/2023)   Housing Stability Vital Sign    Unable to Pay for Housing in the Last Year: Yes    Homeless in the Last Year: No  Financial Resource Strain: High Risk (11/24/2023)   Overall Financial Resource Strain (CARDIA)    Difficulty of Paying Living Expenses: Very hard  Transportation Needs: No Transportation Needs (11/24/2023)   PRAPARE - Administrator, Civil Service (Medical): No    Lack of Transportation (Non-Medical): No  Utilities: At Risk (11/24/2023)   Utilities    Threatened with loss of utilities: Already shut off    SDOH Interventions Today    Flowsheet Row Most Recent Value  SDOH Interventions   Housing Interventions [id number] Referral  [Sent NCCARE360 referral to [facility] Housing Coalition/Rental Housing Counseling Program. Per patient request mailing list of shelters in [place].]  Utilities Interventions Other (Comment)  [Spoke with Unique at [facility] DSS she will put in a call back request and someone will contact the patient. [employer] still has funds available.]        Care guide performed the following interventions: Spoke with she is being assisted with housing, rental and utility by [employer], [employer]. She is on the waitlist with [employer], she has received assitance from [employer], she  has received funding allowance from [employer]. Patient is being assisted by Shella Maxim, [place] [facility] [place].   Spoke with Unique at [facility] DSS she will put in a call back request and someone will contact the patient. [employer] still has funds available.  Patient consented to sending a NCCARE360 referral to [facility] Housing [employer]. Per patient request will mail list of shelters in [place]. Included information for [employer] transportation benefit.  Follow Up Plan:  No further follow up planned at this time. The patient has been provided with needed resources.  Encounter Outcome:  Patient Visit Completed  Garon [facility]  [facility] Guide Direct Dial: [phone]  Fax: [phone] Website: [email or web address]

## 2023-11-25 ENCOUNTER — Telehealth: Payer: Self-pay | Admitting: Internal Medicine

## 2023-11-25 DIAGNOSIS — N1832 Chronic kidney disease, stage 3b: Secondary | ICD-10-CM

## 2023-11-25 NOTE — Telephone Encounter (Signed)
-----   Message from [facility] sent at 11/23/2023  2:19 PM EST ----- Regarding: RE: Nephrology referral Good Afternoon I called  [facility]  and they said  that  contact patient  couple of times the las time was  10/04/23 and no respond  and after mailed a letter to contact them to schedule an appointment   They close the referral and need a new one ----- Message ----- From: Marcine Matar, MD Sent: 11/21/2023   5:26 PM EST To: Dionne Bucy Subject: Nephrology referral                            Referred to nephrology October of last year.  Patient states she has not received a call as yet.  Can you please follow-up with [facility] on this?

## 2023-11-27 ENCOUNTER — Ambulatory Visit: Payer: Medicaid Other

## 2023-11-28 NOTE — Telephone Encounter (Signed)
Completed [place] application emailed to [employer]

## 2023-12-04 ENCOUNTER — Ambulatory Visit: Payer: Medicaid Other | Attending: Cardiology

## 2023-12-04 DIAGNOSIS — Z7901 Long term (current) use of anticoagulants: Secondary | ICD-10-CM | POA: Diagnosis not present

## 2023-12-04 LAB — POCT INR: INR: 3.3 — AB (ref 2.0–3.0)

## 2023-12-04 NOTE — Patient Instructions (Signed)
 Continue taking warfarin 1 tablet daily except for 1.5 tablets Sunday, Tuesday, and Thursday.  Continue regular ensure intake (1 per week).  Recheck INR in 2 weeks.  Coumadin Clinic [phone]

## 2023-12-05 ENCOUNTER — Ambulatory Visit (HOSPITAL_COMMUNITY)
Admission: RE | Admit: 2023-12-05 | Discharge: 2023-12-05 | Disposition: A | Discharge status: Home / Self Care | Payer: Medicaid Other | Source: Ambulatory Visit | Attending: *Deleted | Admitting: *Deleted

## 2023-12-05 DIAGNOSIS — R069 Unspecified abnormalities of breathing: Secondary | ICD-10-CM | POA: Insufficient documentation

## 2023-12-05 DIAGNOSIS — R0602 Shortness of breath: Secondary | ICD-10-CM | POA: Diagnosis not present

## 2023-12-05 DIAGNOSIS — I38 Endocarditis, valve unspecified: Secondary | ICD-10-CM | POA: Insufficient documentation

## 2023-12-05 DIAGNOSIS — I5032 Chronic diastolic (congestive) heart failure: Secondary | ICD-10-CM | POA: Diagnosis not present

## 2023-12-05 LAB — ECHOCARDIOGRAM COMPLETE
Calc EF: 40.9 %
MV VTI: 2.05 cm2
S' Lateral: 3 cm
Single Plane A2C EF: 42.1 %
Single Plane A4C EF: 41.2 %

## 2023-12-06 ENCOUNTER — Telehealth: Payer: Self-pay | Admitting: Internal Medicine

## 2023-12-06 NOTE — Telephone Encounter (Signed)
 Phone call placed to patient this morning to go over the results of the echocardiogram.  This revealed low normal LV EF of 50 to 55% unchanged from 1 year ago.  Diastolic function indeterminate.  Mildly reduced right ventricular function compared to 1 year ago.  No significant valvular abnormalities. I inquired whether the shortness of breath has improved, is the same or has gotten worse.  Patient states it has improved.  She has no lower extremity edema.  I reminded her to go and get the chest x-ray done that was ordered.  We still await an appointment for her to see the cardiologist.

## 2023-12-08 NOTE — Progress Notes (Deleted)
 Mary [name]

## 2023-12-11 ENCOUNTER — Ambulatory Visit: Payer: Medicaid Other | Admitting: Gastroenterology

## 2023-12-12 ENCOUNTER — Telehealth: Payer: Self-pay | Admitting: *Deleted

## 2023-12-12 NOTE — Telephone Encounter (Signed)
 Take vitamin B12 5000 mcg 2 tablets daily.

## 2023-12-12 NOTE — Telephone Encounter (Signed)
 Copied from CRM [id number]. Topic: Clinical - Medication Question >> Dec 12, 2023 12:02 PM Mary [name] wrote: Reason for CRM: patient called stated her provider advised her to take over the counter B-12 and does not know how much she should be taking daily. Please f/u with patient

## 2023-12-12 NOTE — Telephone Encounter (Signed)
 Patient advise to continue supplement at the dose she has purchased : B12 500 mcg daily. Please advise if otherwise.

## 2023-12-14 NOTE — Telephone Encounter (Signed)
 Thank you for catching that. Should be 500 mcg - take 2 tablets of it daily.

## 2023-12-14 NOTE — Telephone Encounter (Signed)
 Called & spoke to the patient. Verified name & DOB. Informed patient that the tablets should be 500 mcg and to take 2 tablets of it daily. Patient expressed verbal understanding.

## 2023-12-18 ENCOUNTER — Ambulatory Visit: Attending: Cardiology

## 2023-12-18 DIAGNOSIS — Z7901 Long term (current) use of anticoagulants: Secondary | ICD-10-CM

## 2023-12-18 DIAGNOSIS — Z954 Presence of other heart-valve replacement: Secondary | ICD-10-CM | POA: Diagnosis not present

## 2023-12-18 NOTE — Patient Instructions (Signed)
 Description   Called and spoke with pt. Instructed to HOLD today's dose, tomorrow's dose, Wednesday's dose and Thursday's dose. Then, continue taking warfarin 1 tablet daily except for 1.5 tablets Sunday, Tuesday, and Thursday.  SEEK IMMEDIATE MEDICAL ATTENTION IF YOU HAVE SIGN OR SYMPTOMS OF BLEEDING  Continue regular ensure intake (1 per week).  Recheck INR on Tuesday  Coumadin Clinic [phone]

## 2023-12-19 LAB — PROTIME-INR
INR: 7.6 (ref 0.9–1.2)
Prothrombin Time: 73.9 s — ABNORMAL HIGH (ref 9.1–12.0)

## 2023-12-21 ENCOUNTER — Ambulatory Visit: Admitting: Podiatry

## 2023-12-26 ENCOUNTER — Telehealth: Payer: Self-pay | Admitting: *Deleted

## 2023-12-26 ENCOUNTER — Ambulatory Visit

## 2023-12-26 NOTE — Telephone Encounter (Signed)
 Called pt since she missed her appt on 4/1, there was no answer so left a message to call back to reschedule.

## 2023-12-29 ENCOUNTER — Telehealth: Payer: Self-pay | Admitting: Internal Medicine

## 2023-12-29 NOTE — Telephone Encounter (Signed)
 Copied from CRM [id number]. Topic: Clinical - Medication Question >> Dec 29, 2023  9:13 AM Elle L wrote: Reason for CRM: The patient has questions regarding her folic acid (FOLVITE) 1 MG tablet and pantoprazole (PROTONIX) 40 MG tablet on how to take it and what she is taking them for. Her call back number is [phone].

## 2024-01-01 ENCOUNTER — Ambulatory Visit

## 2024-01-01 NOTE — Telephone Encounter (Signed)
 Called & spoke to the patient. Verified name & DOB. Informed that pantoprazole is to help with acid reflux and can be taken PRN. Informed that folic acid is to help with neuropathy and to be taken as directed by Dr.Johnson. Patient expressed verbal understanding. No further questions at this time.

## 2024-01-08 ENCOUNTER — Other Ambulatory Visit: Payer: Self-pay | Admitting: *Deleted

## 2024-01-08 ENCOUNTER — Ambulatory Visit: Attending: Cardiology | Admitting: *Deleted

## 2024-01-08 DIAGNOSIS — Z7901 Long term (current) use of anticoagulants: Secondary | ICD-10-CM

## 2024-01-08 DIAGNOSIS — Z954 Presence of other heart-valve replacement: Secondary | ICD-10-CM

## 2024-01-08 LAB — POCT INR: INR: 1.3 — AB (ref 2.0–3.0)

## 2024-01-08 MED ORDER — WARFARIN SODIUM 5 MG PO TABS: ORAL_TABLET | ORAL | 0 refills | Status: DC

## 2024-01-08 NOTE — Telephone Encounter (Signed)
 Warfarin 5mg  refill  S/P mitral valve replacement with metallic valve  Last INR today- 01/08/24 Last OV-06/02/21-needs appt, NOTE PLACED ON APPT

## 2024-01-08 NOTE — Patient Instructions (Signed)
 Description   PLEASE ADHERE TO APPOINTMENTS. Today take 2 tablets of warfarin and take 2 tablets of warfarin tomorrow then continue taking warfarin 1 tablet daily except for 1.5 tablets Sunday, Tuesday, and Thursday.  SEEK IMMEDIATE MEDICAL ATTENTION IF YOU HAVE SIGN OR SYMPTOMS OF BLEEDING  Continue regular ensure intake (1 per week).  Recheck INR 1 WEEK. Coumadin Clinic [phone]

## 2024-01-15 ENCOUNTER — Encounter

## 2024-01-16 ENCOUNTER — Ambulatory Visit: Attending: Cardiology

## 2024-01-16 ENCOUNTER — Telehealth: Payer: Self-pay | Admitting: *Deleted

## 2024-01-16 NOTE — Telephone Encounter (Signed)
 Called pt since she missed her appointment today, there was no answer so left her a message to call back to reschedule.

## 2024-01-22 ENCOUNTER — Ambulatory Visit: Payer: Medicaid Other | Admitting: Internal Medicine

## 2024-01-29 ENCOUNTER — Encounter: Payer: Self-pay | Admitting: Cardiology

## 2024-01-30 ENCOUNTER — Telehealth: Payer: Self-pay | Admitting: Internal Medicine

## 2024-01-30 NOTE — Telephone Encounter (Signed)
 Copied from CRM [id number]. Topic: Referral - Request for Referral  >> Jan 30, 2024  8:31 AM Elle L wrote: Did the patient discuss referral with their provider in the last year? No  Appointment offered? Yes  Type of order/referral and detailed reason for visit: Physical Therapy. The patient was getting it in her home but would like to leave her home for therapy. They stopped seeing her in November.   Preference of office, provider, location: No preference, as long as it is in [place].   If referral order, have you been seen by this specialty before? Yes, home health.   Can we respond through MyChart? No

## 2024-01-31 NOTE — Telephone Encounter (Signed)
 Called but no answer. LVM informing that an appointment is needed to submit a referral. Follow-up appointment was previously rescheudled for 03/26/2024. Advised patient to call back and see if there are any sooner appointments IF possible.

## 2024-01-31 NOTE — Telephone Encounter (Signed)
 Message received from [facility] that the patient has been certified for [facility] [place]

## 2024-01-31 NOTE — Telephone Encounter (Signed)
 Needs appt.  No showed appt last week.

## 2024-02-05 ENCOUNTER — Ambulatory Visit

## 2024-02-08 ENCOUNTER — Ambulatory Visit

## 2024-02-13 ENCOUNTER — Other Ambulatory Visit: Payer: Self-pay | Admitting: Cardiology

## 2024-02-13 DIAGNOSIS — Z954 Presence of other heart-valve replacement: Secondary | ICD-10-CM

## 2024-02-13 NOTE — Telephone Encounter (Signed)
 Refill request for warfarin:  Last INR was 1.3 on 01/08/24 Next INR : Past due  (Has appt for 6/4) Refill approved x 1 only

## 2024-02-22 ENCOUNTER — Telehealth: Payer: Self-pay

## 2024-02-22 DIAGNOSIS — I5032 Chronic diastolic (congestive) heart failure: Secondary | ICD-10-CM

## 2024-02-22 NOTE — Progress Notes (Signed)
 Opened in error

## 2024-02-22 NOTE — Progress Notes (Signed)
 Complex Care Management Note  Care Guide Note 02/22/2024 Name: Mary [name] MRN: [id number] DOB: 1961-08-20  Mary [name] is a 63 y.o. year old female who sees Lawrance Presume, MD for primary care. I reached out to Merrianne Abbot by phone today to offer complex care management services.  [name] was given information about Complex Care Management services today including:   The Complex Care Management services include support from the care team which includes your Nurse Care Manager, Clinical Social Worker, or Pharmacist.  The Complex Care Management team is here to help remove barriers to the health concerns and goals most important to you. Complex Care Management services are voluntary, and the patient may decline or stop services at any time by request to their care team member.   Complex Care Management Consent Status: Patient agreed to services and verbal consent obtained.   Follow up plan:  Telephone appointment with complex care management team member scheduled for:  02-26-24  Encounter Outcome:  Patient Scheduled   Creola Doheny [facility], [facility] Guide  Direct Dial: [phone]  Fax [phone]

## 2024-02-26 ENCOUNTER — Other Ambulatory Visit: Payer: Self-pay

## 2024-02-26 NOTE — Patient Instructions (Signed)
 Visit Information  Mary [name] was given information about Medicaid Managed Care team care coordination services as a part of their [employer] Medicaid benefit. Mary [name] verbally consentedto engagement with the [facility] Managed Care team.   If you are experiencing a medical emergency, please call 911 or report to your local emergency department or urgent care.   If you have a non-emergency medical problem during routine business hours, please contact your provider's office and ask to speak with a nurse.   For questions related to your [facility] [facility] health plan, please call: [phone]  OR visit the member homepage at: [email or web address]  If you would like to schedule transportation through your [employer]Health [facility] plan, please call the following number at least 2 days in advance of your appointment: [phone]  If you are experiencing a behavioral health crisis, call the [employer] [place]  Behavioral Health Crisis Line at [phone]). The line is available 24 hours a day, seven days a week.  If you would like help to quit smoking, call 1-800-QUIT-NOW ((985)869-3777) OR Espaol: 1-855-Djelo-Ya (5-366-440-3474) o para ms informacin haga clic aqu or Text READY to 259-563 to register via text   Mary [name] - following are the goals we discussed in your visit today:    Goals Addressed             This Visit's Progress    VBCI RN Care Plan related to CHF   On track    Problems:  Chronic Disease Management support and education needs related to CHF  Goal: Over the next 30 days the Patient will attend all scheduled medical appointments: Coumadin  clinic, PCP as evidenced by completed visit notes uploaded to EMR        demonstrate Ongoing adherence to prescribed treatment plan for CHF as evidenced by continuing to weigh daily and monitor for symptoms of CHF/fluid  retention  Interventions:   Heart Failure Interventions: Assessed need for readable accurate scales in home Advised patient to weigh each morning after emptying bladder Discussed importance of daily weight and advised patient to weigh and record daily Discussed the importance of keeping all appointments with provider Screening for signs and symptoms of depression related to chronic disease state  Assessed social determinant of health barriers   Patient Self-Care Activities:  Attend all scheduled provider appointments Call provider office for new concerns or questions  Perform all self care activities independently  Take medications as prescribed   call office if I gain more than 2 pounds in one day or 5 pounds in one week watch for swelling in feet, ankles and legs every day weigh myself daily  Plan:  Follow up with provider re: request for outpatient PT, dental referral, urinary frequency concerns Telephone follow up appointment with care management team member scheduled for:  03/11/24 at 2 PM          VBCI RN Care Plan related to SDOH needs   On track    Problems:  Care Coordination needs related to Food Insecurity  and Housing   Goal: Over the next 30 days the Patient will work with Futures trader through insurance to address housing concerns/status of waitlist with [employer] as evidenced by review of electronic medical record and patient or care team member report   Complete and turn in recertification for food stamps  Interventions:   SDOH Barriers Patient interviewed and SDOH assessment performed        SDOH Interventions    Flowsheet  Row Patient Outreach Telephone from 02/26/2024 in [place] POPULATION [facility]ALTH DEPARTMENT Telephone from 11/24/2023 in [place] POPULATION HEALTH DEPARTMENT ED to Hosp-Admission (Discharged) from 12/05/2022 in [facility] 4E CV SURGICAL PROGRESSIVE CARE  SDOH Interventions     Food Insecurity Interventions Intervention Not  Indicated -- --  Housing Interventions Intervention Not Indicated  [Per chart review, patient has received assistance from Product/process development scientist, funding allowance from [employer]. She is still on the waitlist with [facility]] [id number] Referral  [Sent NCCARE360 referral to [facility]. Per patient request mailing list of shelters in [place].] --  Transportation Interventions Intervention Not Indicated -- Taxi Voucher Given, Inpatient TOC  Utilities Interventions Intervention Not Indicated Other (Comment)  [Spoke with Unique at [facility] DSS she will put in a call back request and someone will contact the patient. [employer] still has funds available.] --      Discussed plans with patient for ongoing care management follow up and provided patient with direct contact information for care management team  Patient Self-Care Activities:  Attend all scheduled provider appointments Call provider office for new concerns or questions  Perform all self care activities independently  Take medications as prescribed    Plan:  Follow up with provider re: request for outpatient PT, dental referral, and urinary concerns Telephone follow up appointment with care management team member scheduled for:  03/11/24 at 2 PM             The patient verbalized understanding of instructions, educational materials, and care plan provided today and DECLINED offer to receive copy of patient instructions, educational materials, and care plan.   Patient                                              will continue to work with care manager through insurance regarding housing, complete and submit food stamp application. RN Care Manager will message PCP regarding urinary issues and dentist referral. Telephone follow up appointment with Managed Medicaid care management team member scheduled for:  Mary Fish, RN MSN [place]  [facility] Health RN Care Manager Direct Dial: [phone]  Fax: [phone]   Following is a copy of your plan of care:  There are no care plans that you recently modified to display for this patient.

## 2024-02-26 NOTE — Patient Outreach (Signed)
 Complex Care Management   Visit Note  02/26/2024  Name:  Mary [name] MRN: [id number] DOB: 07/24/1961  Situation: Referral received for Complex Care Management related to Heart Failure, SDOH Barriers:  Financial Resource Strain, and history of stroke/TIA I obtained verbal consent from Patient.  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    Protein calorie malnutrition (HCC)    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   33mm Sorin Carbomedics mechanical prosthesis via right mini thoracotomy approach   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   Complex valvuloplasty including Cor-matrix ECM patch augmentation of anterior and lateral leaflets with 26mm Edwards mc3 ring annuloplasty via right mini-thoracotomy approach   Sinus tachycardia    SVT (supraventricular tachycardia) (HCC) 03/16/2013   TIA (transient ischemic attack)     Assessment: Patient Reported Symptoms:  Cognitive Cognitive Status: Able to follow simple commands, Alert and oriented to person, place, and time, Normal speech and language skills Cognitive/Intellectual Conditions Management [RPT]: None reported or documented in medical history or problem list      Neurological Neurological Review of Symptoms: Other: [place] Neurological Symptoms/Conditions [RPT]: Memory  changes since stroke Neurological Conditions:  (TIA) Neurological Management Strategies: Adequate rest, Coping strategies Neurological Comment: Patient reports communication has improved since her stroke in 2021.  HEENT HEENT Symptoms Reported: No symptoms reported HEENT Conditions: Vision problem(s), Tooth problem(s) Tooth Problems: missing (Patient reports she did have dentures but accidently threw them away.) Vision Problems: glaucoma HEENT Management Strategies: Medical device (Eyeglasses) Vision problem(s), Tooth problem(s)  Cardiovascular Cardiovascular Symptoms Reported: No symptoms reported Does patient have uncontrolled Hypertension?: No Cardiovascular Conditions: Heart failure, Valvular disease (s/p mitral valve replacement in 2014) Cardiovascular Management Strategies: Weight management, Medication therapy, Exercise Do You Have a Working Readable Scale?: Yes Weight: 150 lb (68 kg) (Patient reported)  Respiratory Respiratory Symptoms Reported: Shortness of breath Additional Respiratory Details: Chronic shortness of breath following heart surgery in 2014. Sleeping on several pillows at night. At baseline per patient.    Endocrine Patient reports the following symptoms related to hypoglycemia or hyperglycemia : Increased urination Is patient diabetic?: No    Gastrointestinal Gastrointestinal Symptoms Reported: No symptoms reported (Last BM today, typically every 2-3 days.) Additional Gastrointestinal Details: Patient reports her appetite is ok. Gastrointestinal Conditions: Reflux/heartburn Gastrointestinal Management Strategies: Medication therapy Nutrition Risk Screen (CP): No indicators present  Genitourinary Genitourinary Symptoms Reported: Frequency, Urgency Additional Genitourinary Details: Patient reports she does not feel like she is able to completely empty her bladder. She has recently had issues with urgency and frequency for the past month. One episode where she had  some leakage. Genitourinary Conditions: Chronic kidney disease (Stage 3a) Genitourinary Management Strategies: Incontinence garment/pad (Panty liner)  Integumentary Integumentary Symptoms Reported: No symptoms reported    Musculoskeletal Musculoskelatal Symptoms Reviewed: Unsteady gait Musculoskeletal Conditions: Unsteady gait (Neuropathy) Musculoskeletal Management Strategies: Medical device, Adequate rest, Exercise Musculoskeletal Comment: Patient reports she used to fall a lot due to orthostatic hypotension. Patient has requested outpatient PT. Per chart review, she needs to have an office visit. She is scheduled 03/26/24 and plans to discuss then. Falls in the past year?: No Number of falls in past year: 1 or less Was there an injury with Fall?: No Fall Risk Category Calculator: 0 Patient Fall Risk Level: Low Fall Risk Patient at Risk for Falls Due to: History of fall(s), Impaired balance/gait Fall risk Follow up: Falls evaluation completed, Education provided, Falls prevention discussed  Psychosocial Psychosocial Symptoms Reported: No symptoms reported     Quality of Family Relationships: helpful, involved, supportive Do you feel physically threatened by others?: No      02/26/2024   10:06 AM  Depression screen PHQ 2/9  Decreased Interest 0  Down, Depressed, Hopeless 0  PHQ - 2 Score 0    There were no vitals filed for this visit.  Medications Reviewed Today     Reviewed by Valaria Garland, RN (Registered Nurse) on 02/26/24 at 1006  Med List Status: <None>   Medication Order Taking? Sig Documenting Provider Last Dose Status Informant  acetaminophen  (TYLENOL ) 500 MG tablet [id number]  Take 1,000 mg by mouth in the morning, at noon, and at bedtime. [provider]  Active Self  folic acid  (FOLVITE ) 1 MG tablet [id number]  Take 1 tablet (1 mg total) by mouth daily. Lawrance Presume, MD  Active   gabapentin  (NEURONTIN ) 300 MG capsule [id number]  Take 1 capsule (300  mg total) by mouth 3 (three) times daily. Lawrance Presume, MD  Active   latanoprost  (XALATAN ) 0.005 % ophthalmic solution [id number]  Place 1 drop into both eyes at bedtime. [provider]  Active Self  pantoprazole  (PROTONIX ) 40 MG tablet [id number]  Take 1 tablet (40 mg total) by mouth 2 (two) times daily before a meal. Lawrance Presume, MD  Active   thiamine  (VITAMIN B1) 100 MG tablet [id number]  Take 1 tablet (100 mg total) by mouth daily. Lawrance Presume, MD  Active   traMADol  (ULTRAM ) 50 MG tablet [id number]  Take 1 tablet (50 mg total) by mouth at bedtime as needed. Newlin, Enobong, MD  Active   warfarin (COUMADIN ) 5 MG tablet [id number]  TAKE 1 TO 1 & 1/2 (ONE TO ONE & ONE-HALF) TABLETS BY MOUTH ONCE DAILY AS DIRECTED BY  COUMADIN   CLINIC Hugh Madura, MD  Active             Recommendation:   PCP Follow-up Specialty provider follow-up Coumadin  clinic 02/28/23 Continue Current Plan of Care A message has been sent to patient's provider regarding urinary concerns and request for dentist referral prior to appointment 03/26/24  Follow Up Plan:   Telephone follow up appointment date/time:  03/11/24 at 2 PM  Theodora Fish, RN MSN [place]  VBCI Population Health RN Care Manager Direct Dial: [phone]  Fax: [phone]

## 2024-02-27 ENCOUNTER — Other Ambulatory Visit: Payer: Self-pay | Admitting: Internal Medicine

## 2024-02-27 DIAGNOSIS — G629 Polyneuropathy, unspecified: Secondary | ICD-10-CM

## 2024-02-28 ENCOUNTER — Other Ambulatory Visit (HOSPITAL_COMMUNITY): Payer: Self-pay

## 2024-02-28 ENCOUNTER — Ambulatory Visit: Attending: Cardiology | Admitting: Pharmacist

## 2024-02-28 DIAGNOSIS — Z954 Presence of other heart-valve replacement: Secondary | ICD-10-CM | POA: Diagnosis not present

## 2024-02-28 DIAGNOSIS — D539 Nutritional anemia, unspecified: Secondary | ICD-10-CM | POA: Diagnosis not present

## 2024-02-28 DIAGNOSIS — E538 Deficiency of other specified B group vitamins: Secondary | ICD-10-CM | POA: Insufficient documentation

## 2024-02-28 DIAGNOSIS — Z7901 Long term (current) use of anticoagulants: Secondary | ICD-10-CM | POA: Diagnosis not present

## 2024-02-28 LAB — POCT INR: INR: 1.9 — AB (ref 2.0–3.0)

## 2024-02-28 MED ORDER — THIAMINE HCL 100 MG PO TABS
100.0000 mg | ORAL_TABLET | Freq: Every day | ORAL | 5 refills | Status: DC
  Filled 2024-02-28: qty 30, 30d supply, fill #0
  Filled 2024-04-03 (×2): qty 30, 30d supply, fill #1
  Filled 2024-05-08: qty 30, 30d supply, fill #2
  Filled 2024-06-13: qty 30, 30d supply, fill #3
  Filled 2024-07-15: qty 30, 30d supply, fill #4
  Filled 2024-08-13: qty 30, 30d supply, fill #5

## 2024-02-28 MED ORDER — WARFARIN SODIUM 5 MG PO TABS
ORAL_TABLET | ORAL | 0 refills | Status: DC
  Filled 2024-02-28: qty 40, 26d supply, fill #0

## 2024-02-28 NOTE — Patient Instructions (Addendum)
 Description   PLEASE ADHERE TO APPOINTMENTS. Today take 2 tablets of warfarin and then continue taking warfarin 1 tablet daily except for 1.5 tablets Sunday, Tuesday, and Thursday.  SEEK IMMEDIATE MEDICAL ATTENTION IF YOU HAVE SIGN OR SYMPTOMS OF BLEEDING  Continue regular ensure intake (1 per week).  Recheck INR 2 WEEKs. Coumadin  Clinic [phone]

## 2024-02-28 NOTE — Progress Notes (Signed)
 Patient missed 3 doses of warfarin last week because she did not have transportation to pharmacy. Willing to transfer to [facility] and set up for delivery

## 2024-02-29 ENCOUNTER — Other Ambulatory Visit: Payer: Self-pay

## 2024-02-29 ENCOUNTER — Other Ambulatory Visit (HOSPITAL_COMMUNITY): Payer: Self-pay

## 2024-02-29 MED ORDER — FOLIC ACID 1 MG PO TABS
1.0000 mg | ORAL_TABLET | Freq: Every day | ORAL | 0 refills | Status: DC
  Filled 2024-02-29 – 2024-03-07 (×3): qty 30, 30d supply, fill #0

## 2024-02-29 MED ORDER — PANTOPRAZOLE SODIUM 40 MG PO TBEC
40.0000 mg | DELAYED_RELEASE_TABLET | Freq: Two times a day (BID) | ORAL | 0 refills | Status: DC
  Filled 2024-02-29 – 2024-08-13 (×2): qty 180, 90d supply, fill #0

## 2024-03-01 ENCOUNTER — Ambulatory Visit: Admitting: Family

## 2024-03-02 ENCOUNTER — Telehealth: Payer: Self-pay | Admitting: Internal Medicine

## 2024-03-02 NOTE — Telephone Encounter (Signed)
-----   Message from Nurse Charlton Cooler sent at 02/26/2024 10:36 AM EDT ----- Regarding: Patient Update Good morning Dr. Lincoln Renshaw,  I am the Care Manager from [facility] working with this patient. I had my initial visit with her today and wanted to provide an update prior to your scheduled appointment with her on 03/26/24. She is complaining of increased urinary frequency and urgency over the past month. She has had one episode of incontinence and would like to discuss further at upcoming visit. She is also requesting a referral for a dentist that approves Medicaid.  Please let me know if you have any questions.  Thank you, Theodora Fish, RN MSN [place]  [facility] RN Care Manager Direct Dial: [phone]  Fax: [phone]

## 2024-03-06 ENCOUNTER — Other Ambulatory Visit: Payer: Self-pay | Admitting: Internal Medicine

## 2024-03-06 NOTE — Telephone Encounter (Signed)
 Copied from CRM [id number]. Topic: Clinical - Medication Refill >> Mar 06, 2024  8:37 AM Oddis Bench wrote: Medication: folic acid  (FOLVITE ) 1 MG tablet  Has the patient contacted their pharmacy? No Did not want to call   This is the patient's preferred pharmacy:  [facility] [address] [place],  [place]  [address] [phone]   Is this the correct pharmacy for this prescription? Yes If no, delete pharmacy and type the correct one.   Has the prescription been filled recently? Yes  Is the patient out of the medication? Yes  Has the patient been seen for an appointment in the last year OR does the patient have an upcoming appointment? Yes  Can we respond through MyChart? No  Agent: Please be advised that Rx refills may take up to 3 business days. We ask that you follow-up with your pharmacy.

## 2024-03-07 ENCOUNTER — Other Ambulatory Visit (HOSPITAL_COMMUNITY): Payer: Self-pay

## 2024-03-07 NOTE — Telephone Encounter (Signed)
 Requested Prescriptions  Refused Prescriptions Disp Refills   folic acid  (FOLVITE ) 1 MG tablet 30 tablet 1    Sig: Take 1 tablet (1 mg total) by mouth daily.     Endocrinology:  Vitamins Passed - 03/07/2024  2:17 PM      Passed - Valid encounter within last 12 months    Recent Outpatient Visits           3 months ago SOB (shortness of breath)   [facility] [place]. [facility] Lawrance Presume, MD   7 months ago Alcoholic peripheral neuropathy [facility])   [facility] [place]. [facility] Lawrance Presume, MD   1 year ago Hospital discharge follow-up   [facility] [place] Presume[facility] MD   1 year ago Overweight   [facility] [place] - [facility] Of [place]. [facility] Joaquin Mulberry, MD   2 years ago Vitamin B12 deficiency   [facility] Of [place]. [facility] [place], [place], PA-C

## 2024-03-11 ENCOUNTER — Other Ambulatory Visit: Payer: Self-pay

## 2024-03-11 NOTE — Patient Instructions (Signed)
 Visit Information  Ms. Minotti was given information about Medicaid Managed Care team care coordination services as a part of their [employer] Medicaid benefit. Doria THEREASA IANNELLO verbally consentedto engagement with the [facility] Managed Care team.   If you are experiencing a medical emergency, please call 911 or report to your local emergency department or urgent care.   If you have a non-emergency medical problem during routine business hours, please contact your provider's office and ask to speak with a nurse.   For questions related to your [employer] [facility] health plan, please call: [phone]  OR visit the member homepage at: [email or web address]  If you would like to schedule transportation through your [facility] [facility] plan, please call the following number at least 2 days in advance of your appointment: [phone]  If you are experiencing a behavioral health crisis, call the [employer] [place]  Behavioral Health Crisis Line at [phone]). The line is available 24 hours a day, seven days a week.  If you would like help to quit smoking, call 1-800-QUIT-NOW (380-166-6745) OR Espaol: 1-855-Djelo-Ya (4-403-474-2595) o para ms informacin haga clic aqu or Text READY to 638-756 to register via text   Ms. Ace Abu - following are the goals we discussed in your visit today:    Goals Addressed             This Visit's Progress    VBCI RN Care Plan related to CHF   On track    Problems:  Chronic Disease Management support and education needs related to CHF  Goal: Over the next 30 days the Patient will attend all scheduled medical appointments: Coumadin  clinic, PCP as evidenced by completed visit notes uploaded to EMR        demonstrate Ongoing adherence to prescribed treatment plan for CHF as evidenced by continuing to weigh daily and monitor for symptoms of CHF/fluid  retention  Interventions:   Heart Failure Interventions: Assessed need for readable accurate scales in home Advised patient to weigh each morning after emptying bladder Discussed importance of daily weight and advised patient to weigh and record daily Discussed the importance of keeping all appointments with provider Screening for signs and symptoms of depression related to chronic disease state  Assessed social determinant of health barriers   Patient Self-Care Activities:  Attend all scheduled provider appointments Call provider office for new concerns or questions  Perform all self care activities independently  Take medications as prescribed   call office if I gain more than 2 pounds in one day or 5 pounds in one week watch for swelling in feet, ankles and legs every day weigh myself daily  Plan:  Telephone follow up appointment with care management team member scheduled for:  03/27/24 at 11 AM          VBCI RN Care Plan related to SDOH needs   On track    Problems:  Care Coordination needs related to Food Insecurity  and Housing   Goal: Over the next 30 days the Patient will work with Futures trader through insurance to address housing concerns/status of waitlist with [employer] as evidenced by review of electronic medical record and patient or care team member report   Complete and turn in recertification for food stamps  Interventions:   SDOH Barriers Patient interviewed and SDOH assessment performed        SDOH Interventions    Flowsheet Row Patient Outreach Telephone from 02/26/2024 in [place] [facility] POP[facility] DEPART[facility] Telephone from  11/24/2023 in [place] POPULATION HEALTH DEPARTMENT ED to Hosp-Admission (Discharged) from 12/05/2022 in [facility] 4E CV SURGICAL PROGRESSIVE CARE  SDOH Interventions     Food Insecurity Interventions Intervention Not Indicated -- --  Housing Interventions Intervention Not Indicated  [Per chart review, patient has  received assistance from Product/process development scientist, funding allowance from [employer]. She is still on the waitlist with [employer] Housing [employer]] [id number] Referral  [Sent NCCARE360 referral to [facility] [employer]. Per patient request mailing list of shelters in [place].] --  Transportation Interventions Intervention Not Indicated -- Taxi Voucher Given, Inpatient TOC  Utilities Interventions Intervention Not Indicated Other (Comment)  [Spoke with Unique at [facility] DSS she will put in a call back request and someone will contact the patient. [employer] still has funds available.] --   Discussed plans with patient for ongoing care management follow up and provided patient with direct contact information for care management team Message sent to Burnett Carson, RN at PCP office, for further suggestions and to ask if she can meet with patient before/after upcoming PCP visit Discussed importance of writing questions down for PCP prior to upcoming appointment so that all concerns can be addressed  Patient Self-Care Activities:  Attend all scheduled provider appointments Call provider office for new concerns or questions  Perform all self care activities independently  Take medications as prescribed   Call number on the back of your insurance card to ask for the names of 2-3 dentists in the area who are in network. Call these offices to schedule an appointment.  Plan:  Follow up with provider re: request for outpatient PT, rollator, and urinary concerns; request for letter stating need for first floor apartment Telephone follow up appointment with care management team member scheduled for:  03/27/24 at 11 AM             The patient verbalized understanding of instructions, educational materials, and care plan provided today and DECLINED offer to receive copy of patient instructions, educational materials, and care plan.   The Patient                                               will call insurance* as advised to obtain name of 2-3 dentists within network.  RN Care Manager will message RN at PCP office regarding housing/accommodation concerns. Telephone follow up appointment with Managed Medicaid care management team member scheduled for: 03/27/24 at 11 AM  Theodora Fish, RN MSN [place]  [facility] RN Care Manager Direct Dial: [phone]  Fax: [phone]   Following is a copy of your plan of care:  There are no care plans that you recently modified to display for this patient.

## 2024-03-11 NOTE — Patient Outreach (Signed)
 Complex Care Management   Visit Note  03/11/2024  Name:  Mary [name] MRN: [id number] DOB: March 15, 1961  Situation: Referral received for Complex Care Management related to Heart Failure and SDOH Barriers:  Financial Resource Strain I obtained verbal consent from Patient.  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    Protein calorie malnutrition (HCC)    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   33mm Sorin Carbomedics mechanical prosthesis via right mini thoracotomy approach   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   Complex valvuloplasty including Cor-matrix ECM patch augmentation of anterior and lateral leaflets with 26mm Edwards mc3 ring annuloplasty via right mini-thoracotomy approach   Sinus tachycardia    SVT (supraventricular tachycardia) (HCC) 03/16/2013   TIA (transient ischemic attack)     Assessment: Patient Reported Symptoms:  Cognitive Cognitive Status: Able to follow simple commands, Alert and oriented to person, place, and time, Normal speech and language skills Cognitive/Intellectual Conditions Management [RPT]: None reported or documented in medical history or problem list      Neurological Neurological Review of Symptoms: Not assessed    HEENT HEENT Symptoms Reported: Not assessed HEENT Conditions:  Vision problem(s), Tooth problem(s) Tooth Problems: missing Vision Problems: glaucoma HEENT Management Strategies: Medical device HEENT Comment: Advised patient per PCP instructions to contact number on the back of her insurance card to get the name of 2-3 dentists in the area who are in her network and then call and schedule an appointment (needs new dentures). Vision problem(s), Tooth problem(s)  Cardiovascular Cardiovascular Symptoms Reported: No symptoms reported Does patient have uncontrolled Hypertension?: No Cardiovascular Conditions: Heart failure, Valvular disease (s/p mitral valve replacement in 2014) Cardiovascular Management Strategies: Weight management, Medication therapy, Exercise Do You Have a Working Readable Scale?: Yes Weight: 152 lb (68.9 kg) (patient reported)  Respiratory Respiratory Symptoms Reported: Not assesed    Endocrine Patient reports the following symptoms related to hypoglycemia or hyperglycemia : Not assessed    Gastrointestinal Gastrointestinal Symptoms Reported: Not assessed      Genitourinary Genitourinary Symptoms Reported: Frequency, Urgency Additional Genitourinary Details: Patient does not feel that urinary concerns have gotten worse. She has not had any more episodes of leaking. Genitourinary Conditions: Chronic kidney disease (Stage 3a) Genitourinary Management Strategies: Incontinence garment/pad (Panty liner)  Integumentary Integumentary Symptoms Reported: Not assessed    Musculoskeletal Musculoskelatal Symptoms Reviewed: Unsteady gait Musculoskeletal Conditions: Unsteady gait (Neuropathy) Musculoskeletal Management Strategies: Medical device, Adequate rest, Exercise Musculoskeletal Comment: Patient is asking about an order for a rollator so that she can have an ambulation device that she can sit on when feeling fatigued. Advised that while this is typically covered by insurance, she will need an office visit to evaluate and for  order to placed.  Patient will discuss at upcoming visit with PCP. Falls in the past year?: No Number of falls in past year: 1 or less Was there an injury with Fall?: No Fall Risk Category Calculator: 0 Patient Fall Risk Level: Low Fall Risk Patient at Risk for Falls Due to: History of fall(s), Impaired balance/gait Fall risk Follow up: Falls evaluation completed, Education provided, Falls prevention discussed  Psychosocial Psychosocial Symptoms Reported: Not assessed            02/26/2024   10:06 AM  Depression screen PHQ 2/9  Decreased Interest 0  Down, Depressed, Hopeless 0  PHQ - 2 Score 0    There were no vitals filed for this visit.  Medications Reviewed Today     Reviewed by Valaria Garland, RN (Registered Nurse) on 03/11/24 at 1406  Med List Status: <None>   Medication Order Taking? Sig Documenting Provider Last Dose Status Informant  acetaminophen  (TYLENOL ) 500 MG tablet [id number] No Take 1,000 mg by mouth in the morning, at noon, and at bedtime. [provider] Taking Active Self  folic acid  (FOLVITE ) 1 MG tablet [id number]  Take 1 tablet by mouth once daily Lawrance Presume, MD  Active   folic acid  (FOLVITE ) 1 MG tablet [id number]  Take 1 tablet (1 mg total) by mouth daily.   Active   gabapentin  (NEURONTIN ) 300 MG capsule [id number] No Take 1 capsule (300 mg total) by mouth 3 (three) times daily. Lawrance Presume, MD Taking Active   latanoprost  (XALATAN ) 0.005 % ophthalmic solution [id number]  Place 1 drop into both eyes at bedtime. Pavero, Veryl Gottron, [facility]  Active   pantoprazole  (PROTONIX ) 40 MG tablet [id number] No Take 1 tablet (40 mg total) by mouth 2 (two) times daily before a meal. Lawrance Presume, MD Taking Active   pantoprazole  (PROTONIX ) 40 MG tablet [id number]  Take 1 tablet (40 mg total) by mouth 2 (two) times daily before a meal. Lawrance Presume, MD  Active   thiamine  (VITAMIN B1) 100 MG tablet [id number]  Take 1 tablet (100 mg total) by mouth daily. Hugh Madura, MD  Active   traMADol  (ULTRAM ) 50 MG tablet [id number] No Take 1 tablet (50 mg total) by mouth at bedtime as needed.  Patient not taking: Reported on 02/26/2024   Newlin, Enobong, MD Not Taking Active   warfarin (COUMADIN ) 5 MG tablet [id number]  TAKE 1 TO 1 & 1/2 (ONE TO ONE & ONE-HALF) TABLETS BY MOUTH ONCE DAILY AS DIRECTED BY  COUMADIN  CLINIC Hugh Madura, MD  Active             Recommendation:   PCP Follow-up Continue Current Plan of Care Write questions for PCP down and bring them to your appointment  Follow Up Plan:   Telephone follow up appointment date/time:  03/27/24 at 11 AM  Theodora Fish, RN MSN [place]  [facility] RN Care Manager Direct Dial: [phone]  Fax: [phone]

## 2024-03-13 ENCOUNTER — Ambulatory Visit

## 2024-03-19 ENCOUNTER — Ambulatory Visit: Attending: Cardiology | Admitting: *Deleted

## 2024-03-19 ENCOUNTER — Other Ambulatory Visit: Payer: Self-pay | Admitting: *Deleted

## 2024-03-19 ENCOUNTER — Other Ambulatory Visit (HOSPITAL_COMMUNITY): Payer: Self-pay

## 2024-03-19 DIAGNOSIS — Z954 Presence of other heart-valve replacement: Secondary | ICD-10-CM

## 2024-03-19 DIAGNOSIS — Z7901 Long term (current) use of anticoagulants: Secondary | ICD-10-CM | POA: Insufficient documentation

## 2024-03-19 LAB — POCT INR: INR: 5.8 — AB (ref 2.0–3.0)

## 2024-03-19 MED ORDER — WARFARIN SODIUM 5 MG PO TABS
5.0000 mg | ORAL_TABLET | Freq: Every day | ORAL | 0 refills | Status: DC
  Filled 2024-03-19: qty 40, 30d supply, fill #0

## 2024-03-19 NOTE — Progress Notes (Signed)
Please see anticoagulation encounter.

## 2024-03-19 NOTE — Patient Instructions (Addendum)
 Description   PLEASE ADHERE TO APPOINTMENTS. Do not take any warfarin today and no warfarin tomorrow then continue taking warfarin 1 tablet daily except for 1.5 tablets Sunday, Tuesday, and Thursday.  SEEK IMMEDIATE MEDICAL ATTENTION IF YOU HAVE SIGN OR SYMPTOMS OF BLEEDING  Continue regular ensure intake (1 per week).  Recheck INR 1 WEEK. Coumadin  Clinic [id number]

## 2024-03-19 NOTE — Telephone Encounter (Signed)
 WARFARIN 5MG  REFILL Heart valve replaced by other means  LAST INR TODAY, 6/24 LAST OV 06/22/21-ADVISED NEEDS APPT

## 2024-03-26 ENCOUNTER — Ambulatory Visit: Admitting: Internal Medicine

## 2024-03-26 ENCOUNTER — Ambulatory Visit

## 2024-03-27 ENCOUNTER — Telehealth: Payer: Self-pay

## 2024-03-27 NOTE — Patient Outreach (Signed)
 Care Coordination   03/27/2024 Name: Mary [name] MRN: [id number] DOB: [birth year]   Care Coordination Outreach Attempts:  An unsuccessful outreach was attempted for an appointment today.  Follow Up Plan:  Additional outreach attempts will be made to offer the patient complex care management information and services.   Encounter Outcome:  No Answer. HIPAA compliant voicemail left.   Rosaline Finlay, RN MSN [place]  [facility] Population Health RN Care Manager Direct Dial: [phone]  Fax: [phone]

## 2024-04-01 ENCOUNTER — Other Ambulatory Visit: Payer: Self-pay

## 2024-04-01 NOTE — Patient Instructions (Signed)
 Visit Information  Ms. Toste was given information about Medicaid Managed Care team care coordination services as a part of their [employer] Medicaid benefit. Farah LOTTIE SIGMAN verbally consentedto engagement with the [facility] Managed Care team.   If you are experiencing a medical emergency, please call 911 or report to your local emergency department or urgent care.   If you have a non-emergency medical problem during routine business hours, please contact your provider's office and ask to speak with a nurse.   For questions related to your [employer] [facility] health plan, please call: [phone]  OR visit the member homepage at: [email or web address]  If you would like to schedule transportation through your [employer] [facility] plan, please call the following number at least 2 days in advance of your appointment: [phone]  If you are experiencing a behavioral health crisis, call the [employer] [place]  Behavioral Health Crisis Line at [phone]). The line is available 24 hours a day, seven days a week.  If you would like help to quit smoking, call 1-800-QUIT-NOW ((419)821-7065) OR Espaol: 1-855-Djelo-Ya (8-144-664-6430) o para ms informacin haga clic aqu or Text READY to 799-599 to register via text   Ms. Mary [name] - following are the goals we discussed in your visit today:    Goals Addressed             This Visit's Progress    VBCI RN Care Plan   On track    Problems:  Chronic Disease Management support and education needs related to chronic diseases  Goal: Over the next 30 days the Patient will attend all scheduled medical appointments: Coumadin  clinic, PCP as evidenced by completed visit notes uploaded to EMR        demonstrate Ongoing adherence to prescribed treatment plan for CHF as evidenced by continuing to weigh daily and monitor for symptoms of CHF/fluid  retention Report a decrease in discomfort related to hemorrhoids  Interventions:   Heart Failure Interventions: Discussed the importance of keeping all appointments with provider  Patient Self-Care Activities:  Attend all scheduled provider appointments Call provider office for new concerns or questions  Perform all self care activities independently  Take medications as prescribed   call office if I gain more than 2 pounds in one day or 5 pounds in one week watch for swelling in feet, ankles and legs every day weigh myself daily  Plan:  Telephone follow up appointment with care management team member scheduled for:  04/29/24 at 9 AM          VBCI RN Care Plan related to SDOH needs   On track    Problems:  Care Coordination needs related to Food Insecurity  and Housing   Goal: Over the next 30 days the Patient will work with Futures trader through insurance to address housing concerns/status of waitlist with [employer] as evidenced by review of electronic medical record and patient or care team member report   Complete and turn in recertification for food stamps  Interventions:   SDOH Barriers Patient interviewed and SDOH assessment performed        SDOH Interventions    Flowsheet Row Patient Outreach Telephone from 02/26/2024 in [place] POPULATION HEALTH DEPARTMENT Telephone from 11/24/2023 in [place] POPULATION HEALTH DEPARTMENT ED to Hosp-Admission (Discharged) from 12/05/2022 in [facility] 4E CV SURGICAL PROGRESSIVE CARE  SDOH Interventions     Food Insecurity Interventions Intervention Not Indicated -- --  Housing Interventions Intervention Not Indicated  [Per chart  review, patient has received assistance from helping Therapist, art, funding allowance from [employer]. She is still on the waitlist with [facility] Housing [facility]] [id number] Referral  [Sent NCCARE360 referral to [facility] [employer]. Per patient request  mailing list of shelters in [place].] --  Transportation Interventions Intervention Not Indicated -- Taxi Voucher Given, Inpatient TOC  Utilities Interventions Intervention Not Indicated Other (Comment)  [Spoke with Unique at [facility] DSS she will put in a call back request and someone will contact the patient. [employer] still has funds available.] --   Discussed plans with patient for ongoing care management follow up and provided patient with direct contact information for care management team Message sent to Slater Diesel, RN at PCP office, to update on rescheduled PCP appointment Discussed importance of writing questions down for PCP prior to upcoming appointment so that all concerns can be addressed  Patient Self-Care Activities:  Attend all scheduled provider appointments Call provider office for new concerns or questions  Perform all self care activities independently  Take medications as prescribed   Call number on the back of your insurance card to ask for the names of 2-3 dentists in the area who are in network. Call these offices to schedule an appointment.  Plan:  Follow up with provider re: request for outpatient PT, rollator, and urinary concerns; request for letter stating need for first floor apartment Telephone follow up appointment with care management team member scheduled for:  04/29/24 at 9 AM             The patient verbalized understanding of instructions, educational materials, and care plan provided today and DECLINED offer to receive copy of patient instructions, educational materials, and care plan.   Telephone follow up appointment with Managed Medicaid care management team member scheduled for: 04/29/24 at 9 AM  Rosaline Finlay, RN MSN [place]  [facility] RN Care Manager Direct Dial: [phone]  Fax: [phone]   Following is a copy of your plan of care:  There are no care plans that you recently modified to display for  this patient.

## 2024-04-01 NOTE — Patient Outreach (Signed)
 Complex Care Management   Visit Note  04/01/2024  Name:  Mary [name] MRN: [id number] DOB: 08-19-1961  Situation: Referral received for Complex Care Management related to Heart Failure and SDOH Barriers:  Financial Resource Strain I obtained verbal consent from Patient.  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    Protein calorie malnutrition (HCC)    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   33mm Sorin Carbomedics mechanical prosthesis via right mini thoracotomy approach   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   Complex valvuloplasty including Cor-matrix ECM patch augmentation of anterior and lateral leaflets with 26mm Edwards mc3 ring annuloplasty via right mini-thoracotomy approach   Sinus tachycardia    SVT (supraventricular tachycardia) (HCC) 03/16/2013   TIA (transient ischemic attack)     Assessment: Patient Reported Symptoms:  Cognitive Cognitive Status: Able to follow simple commands, Alert and oriented to person, place, and time, Normal speech and language skills Cognitive/Intellectual Conditions Management [RPT]: None reported or documented in medical history or problem list      Neurological Neurological Review of Symptoms: Not assessed    HEENT HEENT Symptoms Reported: Not assessed      Cardiovascular  Cardiovascular Symptoms Reported: Not assessed    Respiratory Respiratory Symptoms Reported: Not assesed    Endocrine Endocrine Symptoms Reported: Not assessed    Gastrointestinal Gastrointestinal Symptoms Reported: Other Other Gastrointestinal Symptoms: Patient reports she had to cancel PCP appointment 03/26/24 due to pain and swelling r/t hemorrhoids. She reports swelling has gone down. She has not noticed any blood. She is using witch hazel for relief. She plans to discuss hemorrhoids further with PCP at next appointment. Patient does not feel that she has been straining to have BMs. Discussed hemorrhoid-relief strategies such as sitz baths or applying a cold compress or ice pack. Gastrointestinal Management Strategies: Coping strategies    Genitourinary Genitourinary Symptoms Reported: Not assessed    Integumentary Integumentary Symptoms Reported: Not assessed    Musculoskeletal Musculoskelatal Symptoms Reviewed: Not assessed        Psychosocial Psychosocial Symptoms Reported: Not assessed            02/26/2024   10:06 AM  Depression screen PHQ 2/9  Decreased Interest 0  Down, Depressed, Hopeless 0  PHQ - 2 Score 0    There were no vitals filed for this visit.  Medications Reviewed Today   Medications were not reviewed in this encounter     Recommendation:   PCP Follow-up as scheduled 05/17/24 Continue Current Plan of Care  Follow Up Plan:   Telephone follow up appointment date/time:  04/29/24 at 9 AM  Rosaline Finlay, RN MSN [place]  VBCI Population Health RN Care Manager Direct Dial:  [phone]  Fax: [phone]

## 2024-04-03 ENCOUNTER — Other Ambulatory Visit: Payer: Self-pay | Admitting: Internal Medicine

## 2024-04-03 ENCOUNTER — Other Ambulatory Visit: Payer: Self-pay

## 2024-04-03 ENCOUNTER — Other Ambulatory Visit (HOSPITAL_COMMUNITY): Payer: Self-pay

## 2024-04-03 MED FILL — Folic Acid Tab 1 MG: ORAL | 30 days supply | Qty: 30 | Fill #0 | Status: CN

## 2024-04-08 ENCOUNTER — Other Ambulatory Visit: Payer: Self-pay | Admitting: Internal Medicine

## 2024-04-08 NOTE — Telephone Encounter (Unsigned)
 Copied from CRM [id number]. Topic: Clinical - Medication Refill >> Apr 08, 2024  1:53 PM Charlet HERO wrote: Medication: folic acid  (FOLVITE ) 1 MG tablet  Has the patient contacted their pharmacy? Yes They could not refill   This is the patient's preferred pharmacy:   [place] [facility] [address] [place] [address] Phone: [phone] Fax: [phone]    Is this the correct pharmacy for this prescription? Yes If no, delete pharmacy and type the correct one.   Has the prescription been filled recently? Yes  Is the patient out of the medication? Yes  Has the patient been seen for an appointment in the last year OR does the patient have an upcoming appointment? Yes  Can we respond through MyChart? No  Agent: Please be advised that Rx refills may take up to 3 business days. We ask that you follow-up with your pharmacy.

## 2024-04-09 ENCOUNTER — Ambulatory Visit: Attending: Cardiology

## 2024-04-09 DIAGNOSIS — Z7901 Long term (current) use of anticoagulants: Secondary | ICD-10-CM | POA: Insufficient documentation

## 2024-04-09 LAB — POCT INR: INR: 2.5 (ref 2.0–3.0)

## 2024-04-09 NOTE — Patient Instructions (Signed)
 continue taking warfarin 1 tablet daily except for 1.5 tablets Sunday, Tuesday, and Thursday. Continue regular ensure intake (1 per week) or the leafy veggies.  Recheck INR 3 weeks. Coumadin  Clinic [phone]

## 2024-04-09 NOTE — Progress Notes (Signed)
Please see anticoagulation encounter.

## 2024-04-10 ENCOUNTER — Other Ambulatory Visit (HOSPITAL_COMMUNITY): Payer: Self-pay

## 2024-04-10 MED ORDER — FOLIC ACID 1 MG PO TABS
1.0000 mg | ORAL_TABLET | Freq: Every day | ORAL | 1 refills | Status: DC
  Filled 2024-04-10 – 2024-05-07 (×2): qty 30, 30d supply, fill #0

## 2024-04-10 NOTE — Telephone Encounter (Signed)
 folic acid  (FOLVITE ) 1 MG tablet 30 tablet 1 04/03/2024 --   Sig - Route: Take 1 tablet (1 mg total) by mouth daily. Please keep upcoming appointment for more refills. - Oral   Sent to pharmacy as: folic acid  (FOLVITE ) 1 MG tablet   Notes to Pharmacy: Must have office visit for refills    Requested Prescriptions  Pending Prescriptions Disp Refills   folic acid  (FOLVITE ) 1 MG tablet 30 tablet 1    Sig: Take 1 tablet (1 mg total) by mouth daily. Please keep upcoming appointment for more refills.     Endocrinology:  Vitamins Passed - 04/10/2024 10:22 AM      Passed - Valid encounter within last 12 months    Recent Outpatient Visits           4 months ago SOB (shortness of breath)   [facility] [place]. 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 [facility] Vicci Barnie NOVAK, MD   1 year ago Hospital discharge follow-up   [facility] [facility] Of [place]. [facility] Vicci Barnie NOVAK, MD   1 year ago Overweight   [place] [facility] [place] [place] [facility] Dept Of [place]. [facility] Delbert Clam, MD   2 years ago Vitamin B12 deficiency   [place] [facility] [place] - [facility] Dept Of . [facility] [place], Jon HERO, PA-C       Future Appointments             In 3 weeks Leverne Charlies Helling, PA-C [facility] [employer], [facility]

## 2024-04-16 DIAGNOSIS — D6859 Other primary thrombophilia: Secondary | ICD-10-CM | POA: Diagnosis not present

## 2024-04-16 DIAGNOSIS — R3129 Other microscopic hematuria: Secondary | ICD-10-CM | POA: Diagnosis not present

## 2024-04-16 DIAGNOSIS — R809 Proteinuria, unspecified: Secondary | ICD-10-CM | POA: Diagnosis not present

## 2024-04-16 DIAGNOSIS — N1832 Chronic kidney disease, stage 3b: Secondary | ICD-10-CM | POA: Diagnosis not present

## 2024-04-17 ENCOUNTER — Other Ambulatory Visit: Payer: Self-pay

## 2024-04-17 ENCOUNTER — Other Ambulatory Visit (HOSPITAL_COMMUNITY): Payer: Self-pay

## 2024-04-17 MED ORDER — VITAMIN D (ERGOCALCIFEROL) 1.25 MG (50000 UNIT) PO CAPS
ORAL_CAPSULE | ORAL | 0 refills | Status: DC
  Filled 2024-04-17: qty 12, 84d supply, fill #0

## 2024-04-18 ENCOUNTER — Other Ambulatory Visit: Payer: Self-pay | Admitting: Internal Medicine

## 2024-04-18 DIAGNOSIS — K219 Gastro-esophageal reflux disease without esophagitis: Secondary | ICD-10-CM

## 2024-04-18 NOTE — Telephone Encounter (Unsigned)
 Copied from CRM #[id number]. Topic: Clinical - Medication Refill >> Apr 18, 2024 12:02 PM Sasha H wrote: Medication: pantoprazole  (PROTONIX ) 40 MG tablet  Has the patient contacted their pharmacy? Yes (Agent: If no, request that the patient contact the pharmacy for the refill. If patient does not wish to contact the pharmacy document the reason why and proceed with request.) (Agent: If yes, when and what did the pharmacy advise?)  This is the patient's preferred pharmacy:   [place] [facility] [address] [place] [address] Phone: [phone] Fax: [phone]    Is this the correct pharmacy for this prescription? Yes If no, delete pharmacy and type the correct one.   Has the prescription been filled recently? Yes  Is the patient out of the medication? Yes  Has the patient been seen for an appointment in the last year OR does the patient have an upcoming appointment? Yes  Can we respond through MyChart? No  Agent: Please be advised that Rx refills may take up to 3 business days. We ask that you follow-up with your pharmacy.

## 2024-04-19 ENCOUNTER — Other Ambulatory Visit: Payer: Self-pay

## 2024-04-19 ENCOUNTER — Other Ambulatory Visit (HOSPITAL_COMMUNITY): Payer: Self-pay

## 2024-04-19 MED ORDER — PANTOPRAZOLE SODIUM 40 MG PO TBEC
40.0000 mg | DELAYED_RELEASE_TABLET | Freq: Two times a day (BID) | ORAL | 0 refills | Status: AC
  Filled 2024-04-19: qty 180, 90d supply, fill #0

## 2024-04-19 NOTE — Telephone Encounter (Signed)
 Requested Prescriptions  Pending Prescriptions Disp Refills   pantoprazole  (PROTONIX ) 40 MG tablet 180 tablet 0    Sig: Take 1 tablet (40 mg total) by mouth 2 (two) times daily before a meal.     Gastroenterology: Proton Pump Inhibitors Passed - 04/19/2024  3:02 PM      Passed - Valid encounter within last 12 months    Recent Outpatient Visits           5 months ago SOB (shortness of breath)   [facility] Dept Of [place]. 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 [facility] Vicci Barnie NOVAK, MD   1 year ago Hospital discharge follow-up   [place] [facility] - A Dept Of [place]. [facility] Vicci Barnie NOVAK, MD   1 year ago Overweight   [place] [facility] [place] - A Dept Of [place]. [facility] Delbert Clam, MD   2 years ago Vitamin B12 deficiency   [place] [facility] [place] [facility] - A Dept Of [place]. [facility], Jon HERO, PA-C       Future Appointments             In 1 week Leverne Charlies Helling, PA-C [facility] [employer]. [facility], [facility]

## 2024-04-22 ENCOUNTER — Other Ambulatory Visit (HOSPITAL_COMMUNITY): Payer: Self-pay

## 2024-04-23 ENCOUNTER — Other Ambulatory Visit: Payer: Self-pay | Admitting: Internal Medicine

## 2024-04-23 NOTE — Telephone Encounter (Unsigned)
 Copied from CRM [id number]. Topic: Clinical - Medication Refill >> Apr 23, 2024 12:38 PM Winona R wrote: Medication: folic acid  (FOLVITE ) 1 MG tablet  Has the patient contacted their pharmacy? No (Agent: If no, request that the patient contact the pharmacy for the refill. If patient does not wish to contact the pharmacy document the reason why and proceed with request.) (Agent: If yes, when and what did the pharmacy advise?)    - [facility] [address] [place] [address] Phone: [phone] Fax: [phone]  Is this the correct pharmacy for this prescription? Yes If no, delete pharmacy and type the correct one.   Has the prescription been filled recently? No  Is the patient out of the medication? Yes  Has the patient been seen for an appointment in the last year OR does the patient have an upcoming appointment? Yes  Can we respond through MyChart? No  Agent: Please be advised that Rx refills may take up to 3 business days. We ask that you follow-up with your pharmacy.

## 2024-04-24 NOTE — Telephone Encounter (Signed)
 Too soon for refill, LRF 04/10/24, 30 days and 1 RF.  Requested Prescriptions  Pending Prescriptions Disp Refills   folic acid  (FOLVITE ) 1 MG tablet 30 tablet 1    Sig: Take 1 tablet (1 mg total) by mouth daily. Please keep upcoming appointment for more refills.     Endocrinology:  Vitamins Passed - 04/24/2024  1:50 PM      Passed - Valid encounter within last 12 months    Recent Outpatient Visits           5 months ago SOB (shortness of breath)   [place] [facility] Dept Of [place]. 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)   [place] [facility] - A Dept Of [place]. [facility] Vicci Barnie NOVAK, MD   1 year ago Hospital discharge follow-up   [place] [facility] - A Dept Of [place]. [facility] Vicci Barnie NOVAK, MD   1 year ago Overweight   [place] [facility] [place] - A Dept Of [place]. [facility] Delbert Clam, MD   2 years ago Vitamin B12 deficiency   [place] [facility] [place] - A Dept Of [place]. [facility] [place], Jon HERO, PA-C       Future Appointments             In 1 week Leverne Charlies Helling, PA-C [facility] [employer]. [facility], [facility]

## 2024-04-29 ENCOUNTER — Other Ambulatory Visit: Payer: Self-pay

## 2024-04-29 NOTE — Progress Notes (Deleted)
  Cardiology Office Note:  .   Date:  04/29/2024  ID:  Mary [name], DOB 06-28-1961, MRN [id number] PCP: [facility] Barn[facility] NOVAK, MD  Mary [name] Cardiologist:  Oneil Parchment, MD Cardiology APP:  Lelon Glendia DASEN, PA-C  Electrophysiologist:  Danelle Birmingham, MD {  History of Present Illness: .   Mary [name] is a 63 y.o. female w/PMHx of ***   Today's visit is scheduled as a f/u   ROS: ***   Device information ***  Arrhythmia/AAD hx ***  Studies Reviewed: SABRA    EKG done today and reviewed by myself:  ***  DEVICE interrogation done today and reviewed by myself *** Battery and lead measurements are good ***   ***   Risk Assessment/Calculations:    Physical Exam:   VS:  LMP 01/28/2013    Wt Readings from Last 3 Encounters:  03/11/24 152 lb (68.9 kg)  02/26/24 150 lb (68 kg)  11/21/23 148 lb (67.1 kg)    GEN: Well nourished, well developed in no acute distress NECK: No JVD; No carotid bruits CARDIAC: ***RRR, no murmurs, rubs, gallops RESPIRATORY:  *** CTA b/l without rales, wheezing or rhonchi  ABDOMEN: Soft, non-tender, non-distended EXTREMITIES:  No edema; No deformity   PPM/ICD/ILR site: *** is stable, no thinning, fluctuation, tethering  ASSESSMENT AND PLAN: .    *** AFib *** Secondary hypercoagulable state 2/2 AFib     {Are you ordering a CV Procedure (e.g. stress test, cath, DCCV, TEE, etc)?   Press F2        :[id number]}     Dispo: ***  Signed, Charlies Macario Arthur, PA-C

## 2024-04-29 NOTE — Patient Instructions (Signed)
 Visit Information  Mary [name] was given information about Medicaid Managed Care team care coordination services as a part of their [employer] Medicaid benefit.   If you would like to schedule transportation through your [employer] [facility] plan, please call the following number at least 2 days in advance of your appointment: [phone]  If you are experiencing a behavioral health crisis, call the [employer] [place]  Behavioral Health Crisis Line at [phone]). The line is available 24 hours a day, seven days a week.   Mary [name] - following are the goals we discussed in your visit today:    Goals Addressed             This Visit's Progress    VBCI RN Care Plan   On track    Problems:  Chronic Disease Management support and education needs related to chronic diseases  Goal: Over the next 30 days the Patient will attend all scheduled medical appointments: Coumadin  clinic, PCP as evidenced by completed visit notes uploaded to EMR        demonstrate Ongoing adherence to prescribed treatment plan for CHF as evidenced by continuing to weigh daily and monitor for symptoms of CHF/fluid retention  Interventions:   Heart Failure Interventions: Discussed the importance of keeping all appointments with provider  Patient Self-Care Activities:  Attend all scheduled provider appointments Call provider office for new concerns or questions  Perform all self care activities independently  Take medications as prescribed   call office if I gain more than 2 pounds in one day or 5 pounds in one week watch for swelling in feet, ankles and legs every day weigh myself daily  Plan:  Telephone follow up appointment with care management team member scheduled for:  05/28/24 at 10 AM             The patient verbalized understanding of instructions, educational materials, and care plan provided today and DECLINED offer to receive copy of  patient instructions, educational materials, and care plan.   Telephone follow up appointment with Managed Medicaid care management team member scheduled for: 05/28/24 at 10 AM  Rosaline Finlay, RN MSN [place]  [facility] RN Care Manager Direct Dial: [phone]  Fax: [phone]   Following is a copy of your plan of care:  There are no care plans that you recently modified to display for this patient.

## 2024-04-29 NOTE — Patient Outreach (Signed)
 Complex Care Management   Visit Note  04/29/2024  Name:  Mary [name] MRN: [id number] DOB: [birth year]  Situation: Referral received for Complex Care Management related to Heart Failure and SDOH Barriers:  Financial Resource Strain I obtained verbal consent from Patient.  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    Protein calorie malnutrition (HCC)    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   33mm Sorin Carbomedics mechanical prosthesis via right mini thoracotomy approach   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   Complex valvuloplasty including Cor-matrix ECM patch augmentation of anterior and lateral leaflets with 26mm Edwards mc3 ring annuloplasty via right mini-thoracotomy approach   Sinus tachycardia    SVT (supraventricular tachycardia) (HCC) 03/16/2013   TIA (transient ischemic attack)     Assessment: Patient Reported Symptoms:  Cognitive Cognitive Status: Able to follow simple commands, Alert and oriented to person, place, and time, Normal speech and language skills Cognitive/Intellectual Conditions Management [RPT]: None reported or documented in medical history or problem list      Neurological Neurological Review of Symptoms: Not assessed    HEENT HEENT Symptoms Reported: No symptoms reported HEENT  Management Strategies: Routine screening, Medical device HEENT Comment: Patient had a dental appointment this morning. She is getting both upper and lower partial plates. Vision problem(s), Tooth problem(s)  Cardiovascular Cardiovascular Symptoms Reported: Not assessed    Respiratory Respiratory Symptoms Reported: Not assesed    Endocrine Endocrine Symptoms Reported: Not assessed    Gastrointestinal Gastrointestinal Symptoms Reported: Other Other Gastrointestinal Symptoms: Patient continues to use witch hazel and has not noticed any blood in her stools. Gastrointestinal Management Strategies: Coping strategies    Genitourinary Genitourinary Symptoms Reported: No symptoms reported Genitourinary Management Strategies: Incontinence garment/pad (Panty liner) Genitourinary Comment: Patient has had initial visit with nephrology since previous CMRN visit. She denies any changes to her medication regimen at this time. They told her to stay off of the potassium.  Integumentary Integumentary Symptoms Reported: Not assessed    Musculoskeletal Musculoskelatal Symptoms Reviewed: Unsteady gait Musculoskeletal Management Strategies: Medical device, Adequate rest, Exercise Musculoskeletal Comment: Patient has upcoming appointment with PCP 05/17/24. She plans to discuss order for rollator during visit. Falls in the past year?: No Number of falls in past year: 1 or less Was there an injury with Fall?: No Fall Risk Category Calculator: 0 Patient Fall Risk Level: Low Fall Risk Patient at Risk for Falls Due to: Impaired balance/gait Fall risk Follow up: Falls evaluation completed  Psychosocial Psychosocial Symptoms Reported: No symptoms reported            02/26/2024   10:06 AM  Depression screen PHQ 2/9  Decreased Interest 0  Down, Depressed, Hopeless 0  PHQ - 2 Score 0    There were no vitals filed for this visit.  Medications Reviewed Today     Reviewed by Arno Rosaline SQUIBB, RN  (Registered Nurse) on 04/29/24 at [phone]  Med List Status: <None>   Medication Order Taking? Sig Documenting Provider Last Dose Status Informant  acetaminophen  (TYLENOL ) 500 MG tablet [id number] No Take 1,000 mg by mouth in the morning, at noon, and at bedtime. [provider] Taking Active Self  folic acid  (FOLVITE ) 1 MG tablet [id number]  Take 1 tablet (1 mg total) by mouth daily. Please keep upcoming appointment for more refills. Vicci Barnie NOVAK, MD  Active   gabapentin  (NEURONTIN ) 300 MG capsule [id number] No Take 1 capsule (300 mg total) by mouth 3 (three) times daily. Vicci Barnie NOVAK, MD Taking Active   latanoprost  (XALATAN ) 0.005 % ophthalmic solution [id number]  Place 1 drop into both eyes at bedtime. Pavero, Lonni, [facility]  Active   pantoprazole  (PROTONIX ) 40 MG tablet [id number]  Take 1 tablet (40 mg total) by mouth 2 (two) times daily before a meal. Vicci Barnie NOVAK, MD  Active   pantoprazole  (PROTONIX ) 40 MG tablet [id number]  Take 1 tablet (40 mg total) by mouth 2 (two) times daily before a meal. Vicci Barnie NOVAK, MD  Active   thiamine  (VITAMIN B1) 100 MG tablet [id number]  Take 1 tablet (100 mg total) by mouth daily. Jeffrie Oneil BROCKS, MD  Active   traMADol  (ULTRAM ) 50 MG tablet [id number] No Take 1 tablet (50 mg total) by mouth at bedtime as needed.  Patient not taking: Reported on 02/26/2024   Newlin, Enobong, MD Not Taking Active   Vitamin D , Ergocalciferol , (DRISDOL ) 1.25 MG (50000 UNIT) CAPS capsule [id number]  Take 1 capsule (50,000 Units total) by mouth once a week. For 12 weeks, then recheck level in the office   Active   warfarin (COUMADIN ) 5 MG tablet [id number]  Take 1-1&1/2 tablets (5-7.5 mg total) by mouth daily as directed by Coumadin  clinic. Jeffrie Oneil BROCKS, MD  Active             Recommendation:   PCP Follow-up Continue Current Plan of Care  Follow Up Plan:   Telephone follow up appointment date/time:  05/28/24 at 10 AM  Rosaline Arno, RN MSN [facility] RN Care Manager Direct Dial: [phone]  Fax: [phone]

## 2024-04-30 ENCOUNTER — Ambulatory Visit

## 2024-05-01 ENCOUNTER — Ambulatory Visit

## 2024-05-01 ENCOUNTER — Ambulatory Visit: Admitting: Physician Assistant

## 2024-05-07 ENCOUNTER — Other Ambulatory Visit (HOSPITAL_COMMUNITY): Payer: Self-pay

## 2024-05-07 ENCOUNTER — Other Ambulatory Visit: Payer: Self-pay | Admitting: Internal Medicine

## 2024-05-07 ENCOUNTER — Other Ambulatory Visit: Payer: Self-pay

## 2024-05-07 ENCOUNTER — Ambulatory Visit: Attending: Cardiology

## 2024-05-07 DIAGNOSIS — Z954 Presence of other heart-valve replacement: Secondary | ICD-10-CM

## 2024-05-07 DIAGNOSIS — Z7901 Long term (current) use of anticoagulants: Secondary | ICD-10-CM | POA: Diagnosis not present

## 2024-05-07 DIAGNOSIS — G621 Alcoholic polyneuropathy: Secondary | ICD-10-CM

## 2024-05-07 LAB — POCT INR: INR: 2 (ref 2.0–3.0)

## 2024-05-07 MED ORDER — WARFARIN SODIUM 5 MG PO TABS
5.0000 mg | ORAL_TABLET | Freq: Every day | ORAL | 0 refills | Status: DC
  Filled 2024-05-07: qty 40, 30d supply, fill #0

## 2024-05-07 NOTE — Progress Notes (Signed)
 INR 2.0 Please see anticoagulation encounter.

## 2024-05-07 NOTE — Patient Instructions (Signed)
 Take 2 tablets tonight only then  continue taking warfarin 1 tablet daily except for 1.5 tablets Sunday, Tuesday, and Thursday. Continue regular ensure intake (1 per week) or the leafy veggies.  Recheck INR 4 weeks. Coumadin  Clinic [phone]

## 2024-05-08 ENCOUNTER — Other Ambulatory Visit (HOSPITAL_COMMUNITY): Payer: Self-pay

## 2024-05-08 ENCOUNTER — Other Ambulatory Visit: Payer: Self-pay | Admitting: Internal Medicine

## 2024-05-08 ENCOUNTER — Telehealth: Payer: Self-pay

## 2024-05-08 DIAGNOSIS — G621 Alcoholic polyneuropathy: Secondary | ICD-10-CM

## 2024-05-08 MED ORDER — GABAPENTIN 300 MG PO CAPS: 300.0000 mg | ORAL_CAPSULE | Freq: Three times a day (TID) | ORAL | 2 refills | Status: DC

## 2024-05-08 NOTE — Telephone Encounter (Signed)
 Called & spoke to the patient. Verified name & DOB. Patient would like to know why gabapentin  was discontinued. Please advise if medication can be added to medication list. Additionally patient is requesting a refill. Please advise.

## 2024-05-08 NOTE — Telephone Encounter (Signed)
 Gabapentin  was not discontinued. Still on med list. RF sent to [facility].

## 2024-05-08 NOTE — Telephone Encounter (Signed)
 Copied from CRM [id number]. Topic: Clinical - Medication Question >> May 08, 2024  9:25 AM [place] E wrote: Reason for CRM: Patient called in specifically wanting to speak with PCP or nurse in regards to her gabapentin  (NEURONTIN ) 300 MG capsule, stated she had a question with this medication, but did not relay to agent what that question was. Callback number [phone].

## 2024-05-09 ENCOUNTER — Other Ambulatory Visit (HOSPITAL_COMMUNITY): Payer: Self-pay

## 2024-05-10 NOTE — Telephone Encounter (Signed)
 Called but no answer. LVM informing that requested medication was not discontinued and a refill was sent to her preferred pharmacy.

## 2024-05-16 ENCOUNTER — Telehealth: Payer: Self-pay | Admitting: Internal Medicine

## 2024-05-16 NOTE — Telephone Encounter (Signed)
 Confirmed appt for 8/22

## 2024-05-17 ENCOUNTER — Encounter: Payer: Self-pay | Admitting: Internal Medicine

## 2024-05-17 ENCOUNTER — Other Ambulatory Visit (HOSPITAL_COMMUNITY)
Admission: RE | Admit: 2024-05-17 | Discharge: 2024-05-17 | Disposition: A | Discharge status: Home / Self Care | Source: Ambulatory Visit | Attending: Internal Medicine | Admitting: Internal Medicine

## 2024-05-17 ENCOUNTER — Ambulatory Visit: Attending: Internal Medicine | Admitting: Internal Medicine

## 2024-05-17 ENCOUNTER — Telehealth: Payer: Self-pay | Admitting: Internal Medicine

## 2024-05-17 VITALS — BP 112/74 | HR 105 | Ht 62.0 in | Wt 145.0 lb

## 2024-05-17 DIAGNOSIS — G621 Alcoholic polyneuropathy: Secondary | ICD-10-CM

## 2024-05-17 DIAGNOSIS — Z124 Encounter for screening for malignant neoplasm of cervix: Secondary | ICD-10-CM | POA: Insufficient documentation

## 2024-05-17 NOTE — Telephone Encounter (Signed)
 Copied from CRM [id number]. Topic: Clinical - Prescription Issue >> May 17, 2024  4:01 PM Debby BROCKS wrote: Reason for CRM: Patient states she has her medication ready at: [facility] [place] [facility] [place] - [address] But wants it rerouted to: [place] [facility] So that she can pick it up

## 2024-05-17 NOTE — Progress Notes (Signed)
 Patient ID: Mary [name], female    DOB: 03-20-1961  MRN: [id number]  CC: Gynecologic Exam (Pap. Layvonne for recent meds sent to be re-sent to [facility] pharmacy)   Subjective: Mary [name] is a 63 y.o. female who presents for PAP Her concerns today include:  Patient with history of VHD (mechanical mitral valve and tricuspid repair 2014 on chronic Coumadin ), CHF with preserved EF,history of SVT, EtOH abuse, alcohol induced neuropathy and myopathy, macrocytic anemia, documented B12 and thiamine  def, erosive gastritis, pyloric stenosis,   GYN History:  Pt is G4P3 (1 aborted) Any hx of abn paps?:no Menses regular or irregular?: NA How long does menses last? NA Menstrual flow light or heavy?: NA Method of birth control?:  NA Any vaginal dischg at this time?: no Dysuria?: no Any hx of STI?: no Sexually active with how many partners:  Desires STI screen: no Last MMG:  07/2023 Family hx of uterine, cervical or breast cancer?:  none   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   Pharyngitis 12/06/2022   Neuropathy 12/06/2022   Tachycardia 12/05/2022   Supratherapeutic INR 12/05/2022   Hyperkalemia 12/05/2022   Hypotension due to hypovolemia 07/19/2022   Prolonged QT interval 07/19/2022   Marijuana user 12/24/2020   LFT elevation    Acquired pyloric stenosis    GI bleed 11/24/2020   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   Thiamin deficiency 09/01/2020   Paresthesia    Vitamin B12 deficiency 05/04/2020   Vomiting 04/24/2020   GERD (gastroesophageal reflux disease)    Marijuana abuse    Macrocytic anemia 04/04/2019   Intermittent complete heart block (HCC)    Syncope 03/28/2019   Orthostatic hypotension 01/11/2017   Elevated liver enzymes 01/11/2017   Atypical  chest pain 06/05/2016   Acute renal failure superimposed on stage 3a chronic kidney disease (HCC) 01/10/2016   Elevated INR 01/10/2016   TIA (transient ischemic attack) 09/13/2015   CVA (cerebral vascular accident) (HCC) 09/12/2015   SOB (shortness of breath) 02/24/2014   First degree heart block 06/17/2013   (HFpEF) heart failure with preserved ejection fraction (HCC) 06/16/2013   History of bacterial endocarditis 06/16/2013   Warfarin anticoagulation 06/10/2013   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   Femoral hernia 05/23/2013   Mitral valve insufficiency 04/22/2013   Tricuspid regurgitation 04/22/2013   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   SVT (supraventricular tachycardia) (HCC) 03/16/2013     Current Outpatient Medications on File Prior to Visit  Medication Sig Dispense Refill   acetaminophen  (TYLENOL ) 500 MG tablet Take 1,000 mg by mouth in the morning, at noon, and at bedtime.     folic acid  (FOLVITE ) 1 MG tablet Take 1 tablet (1 mg total) by mouth daily. Please keep upcoming appointment for more refills. 30 tablet 1   gabapentin  (NEURONTIN ) 300 MG capsule Take 1 capsule (300 mg total) by mouth 3 (three) times daily. 270 capsule 2   latanoprost  (XALATAN ) 0.005 % ophthalmic solution Place 1 drop into both eyes at bedtime.     pantoprazole  (PROTONIX ) 40 MG tablet Take 1 tablet (40 mg total) by mouth 2 (two) times daily before a meal. 180 tablet 0   pantoprazole  (PROTONIX ) 40 MG tablet Take 1 tablet (40 mg total) by mouth 2 (two) times daily before a meal.  180 tablet 0   thiamine  (VITAMIN B1) 100 MG tablet Take 1 tablet (100 mg total) by mouth daily. 30 tablet 5   Vitamin D , Ergocalciferol , (DRISDOL ) 1.25 MG (50000 UNIT) CAPS capsule Take 1 capsule (50,000 Units total) by mouth once a week. For 12 weeks, then recheck level in the office 12 capsule 0   warfarin (COUMADIN ) 5 MG tablet Take 1-1&1/2 tablets (5-7.5 mg total) by mouth daily as  directed by Coumadin  clinic. 40 tablet 0   traMADol  (ULTRAM ) 50 MG tablet Take 1 tablet (50 mg total) by mouth at bedtime as needed. (Patient not taking: Reported on 05/17/2024) 10 tablet 0   No current facility-administered medications on file prior to visit.    Allergies  Allergen Reactions   Ibuprofen  Itching   Oxycodone  Itching    Pt states no longer makes her itchy   Tape Itching, Rash and Other (See Comments)    THE ONLY TAPE THAT IS TOLERATED IS PAPER TAPE. EKG leads inflame the skin    Social History   Socioeconomic History   Marital status: Single    Spouse name: Not on file   Number of children: Not on file   Years of education: Not on file   Highest education level: Not on file  Occupational History   Occupation: unemployed  Tobacco Use   Smoking status: Former    Current packs/day: 0.00    Average packs/day: 0.5 packs/day for 46.0 years (23.0 ttl pk-yrs)    Types: Cigarettes    Start date: 58    Quit date: 2022    Years since quitting: 3.6   Smokeless tobacco: Never  Vaping Use   Vaping status: Never Used  Substance and Sexual Activity   Alcohol use: Not Currently    Comment:  glass of wine   Drug use: Yes    Types: Marijuana    Comment: chews on it periodically   Sexual activity: Never  Other Topics Concern   Not on file  Social History Narrative   Right handed   Two story home apartment    Social Drivers of Health   Financial Resource Strain: High Risk (11/24/2023)   Overall Financial Resource Strain (CARDIA)    Difficulty of Paying Living Expenses: Very hard  Food Insecurity: No Food Insecurity (02/26/2024)   Hunger Vital Sign    Worried About Running Out of Food in the Last Year: Never true    Ran Out of Food in the Last Year: Never true  Transportation Needs: No Transportation Needs (02/26/2024)   PRAPARE - Administrator, Civil Service (Medical): No    Lack of Transportation (Non-Medical): No  Physical Activity: Not on file   Stress: Not on file  Social Connections: Not on file  Intimate Partner Violence: Not At Risk (02/26/2024)   Humiliation, Afraid, Rape, and Kick questionnaire    Fear of Current or Ex-Partner: No    Emotionally Abused: No    Physically Abused: No    Sexually Abused: No    Family History  Problem Relation Age of Onset   Kidney disease Mother        Had one Kidney removed   Stroke Father    Cancer Father    Sarcoidosis Sister    Heart attack Neg Hx    Colon cancer Neg Hx    Stomach cancer Neg Hx    Pancreatic cancer Neg Hx    Esophageal cancer Neg Hx     Past Surgical History:  Procedure Laterality Date   APPENDECTOMY  ~1978   BALLOON DILATION N/A 11/27/2020   Procedure: BALLOON DILATION;  Surgeon: Avram Lupita BRAVO, MD;  Location: [facility] ENDOSCOPY;  Service: Endoscopy;  Laterality: N/A;   BIOPSY  05/01/2020   Procedure: BIOPSY;  Surgeon: Teressa Toribio SQUIBB, MD;  Location[facility] ENDOSCOPY;  Service: Endoscopy;;   CARDIAC CATHETERIZATION     CARDIAC VALVE REPLACEMENT     CESAREAN SECTION  1983   CESAREAN SECTION WITH BILATERAL TUBAL LIGATION  1999   COLONOSCOPY WITH PROPOFOL  N/A 05/01/2020   Procedure: COLONOSCOPY WITH PROPOFOL ;  Surgeon: Teressa Toribio SQUIBB, MD;  Location: [facility] ENDOSCOPY;  Service: Endoscopy;  Laterality: N/A;   ESOPHAGOGASTRODUODENOSCOPY (EGD) WITH PROPOFOL  N/A 05/01/2020   Procedure: ESOPHAGOGASTRODUODENOSCOPY (EGD) WITH PROPOFOL ;  Surgeon: Teressa Toribio SQUIBB, MD;  Location: [facility] ENDOSCOPY;  Service: Endoscopy;  Laterality: N/A;   ESOPHAGOGASTRODUODENOSCOPY (EGD) WITH PROPOFOL  N/A 11/27/2020   Procedure: ESOPHAGOGASTRODUODENOSCOPY (EGD) WITH PROPOFOL ;  Surgeon: Avram Lupita BRAVO, MD;  Location: [facility] ENDOSCOPY;  Service: Endoscopy;  Laterality: N/A;   FEMORAL HERNIA REPAIR Right 05/23/2013   Procedure: HERNIA REPAIR FEMORAL;  Surgeon: Sudie VEAR Laine, MD;  Location: [facility] OR;  Service: Open Heart Surgery;  Laterality: Right;   INTRAOPERATIVE TRANSESOPHAGEAL ECHOCARDIOGRAM N/A 05/23/2013    Procedure: INTRAOPERATIVE TRANSESOPHAGEAL ECHOCARDIOGRAM;  Surgeon: Sudie VEAR Laine, MD;  Location: [facility] OR;  Service: Open Heart Surgery;  Laterality: N/A;   LAPAROSCOPIC CHOLECYSTECTOMY  2003   LEFT AND RIGHT HEART CATHETERIZATION WITH CORONARY ANGIOGRAM N/A 03/22/2013   Procedure: LEFT AND RIGHT HEART CATHETERIZATION WITH CORONARY ANGIOGRAM;  Surgeon: Lonni JONETTA Cash, MD;  Location: [facility] CATH LAB;  Service: Cardiovascular;  Laterality: N/A;   MINIMALLY INVASIVE TRICUSPID VALVE REPAIR Right 05/23/2013   Procedure: MINIMALLY INVASIVE TRICUSPID VALVE REPAIR;  Surgeon: Sudie VEAR Laine, MD;  Location: [facility] OR;  Service: Open Heart Surgery;  Laterality: Right;   MITRAL VALVE REPLACEMENT N/A 05/23/2013   Procedure: MITRAL VALVE (MV) REPLACEMENT;  Surgeon: Sudie VEAR Laine, MD;  Location: [facility] OR;  Service: Open Heart Surgery;  Laterality: N/A;   MULTIPLE EXTRACTIONS WITH ALVEOLOPLASTY N/A 04/04/2013   Procedure: Extraction of tooth #'s 1,8,9,13,14,15,23,24,25,26 with alveoloplasty and gross debridement of remaining teeth;  Surgeon: Tanda JULIANNA Fanny, DDS;  Location: [facility] OR;  Service: Oral Surgery;  Laterality: N/A;   TEE WITHOUT CARDIOVERSION N/A 03/18/2013   Procedure: TRANSESOPHAGEAL ECHOCARDIOGRAM (TEE);  Surgeon: Ezra GORMAN Shuck, MD;  Location: [facility] ENDOSCOPY;  Service: Cardiovascular;  Laterality: N/A;   TEE WITHOUT CARDIOVERSION N/A 06/17/2013   Procedure: TRANSESOPHAGEAL ECHOCARDIOGRAM (TEE);  Surgeon: Redell GORMAN Shallow, MD;  Location: [facility] ENDOSCOPY;  Service: Cardiovascular;  Laterality: N/A;   TUBAL LIGATION  1999    ROS: Review of Systems Negative except as stated above  PHYSICAL EXAM: BP 112/74 (BP Location: Right Arm, Patient Position: Sitting, Cuff Size: Normal)   Pulse (!) 105   Ht 5' 2 (1.575 m)   Wt 145 lb (65.8 kg)   LMP 01/28/2013   SpO2 97%   BMI 26.52 kg/m   Physical Exam  General appearance - alert, well appearing, and in no distress Mental status - normal mood, behavior,  speech, dress, motor activity, and thought processes Pelvic -CMA Mary [name] is present: Normal external genitalia, vulva, vagina, cervix, uterus and adnexa.  Mild atrophy of vaginal walls and cervical os is mildly stenosed      Latest Ref Rng & Units 11/21/2023    3:12 PM 07/17/2023   12:08 PM 01/25/2023    7:22 AM  CMP  Glucose 70 - 99 mg/dL 88  899  79   BUN 8 - 27 mg/dL 17  16  9    Creatinine 0.57 - 1.00 mg/dL 8.56  8.30  8.98   Sodium 134 - 144 mmol/L 145  141  138   Potassium 3.5 - 5.2 mmol/L 4.8  5.0  3.9   Chloride 96 - 106 mmol/L 108  104  112   CO2 20 - 29 mmol/L 21  19  22    Calcium  8.7 - 10.3 mg/dL 9.8  9.4  8.4   Total Protein 6.0 - 8.5 g/dL  7.1    Total Bilirubin 0.0 - 1.2 mg/dL  0.2    Alkaline Phos 44 - 121 IU/L  118    AST 0 - 40 IU/L  19    ALT 0 - 32 IU/L  9     Lipid Panel     Component Value Date/Time   CHOL 189 05/31/2018 1108   TRIG 300 (H) 05/31/2018 1108   HDL 68 05/31/2018 1108   CHOLHDL 2.8 05/31/2018 1108   CHOLHDL 3.2 06/06/2016 0135   VLDL 22 06/06/2016 0135   LDLCALC 61 05/31/2018 1108    CBC    Component Value Date/Time   WBC 8.6 11/21/2023 1512   WBC 6.7 01/25/2023 0722   RBC 3.59 (L) 11/21/2023 1512   RBC 2.97 (L) 01/25/2023 0722   HGB 11.2 11/21/2023 1512   HCT 34.8 11/21/2023 1512   PLT 353 11/21/2023 1512   MCV 97 11/21/2023 1512   MCH 31.2 11/21/2023 1512   MCH 33.0 01/25/2023 0722   MCHC 32.2 11/21/2023 1512   MCHC 31.3 01/25/2023 0722   RDW 13.1 11/21/2023 1512   LYMPHSABS 1.3 01/24/2023 1013   LYMPHSABS 1.9 06/26/2020 1218   MONOABS 0.6 01/24/2023 1013   EOSABS 0.2 01/24/2023 1013   EOSABS 0.2 06/26/2020 1218   BASOSABS 0.0 01/24/2023 1013   BASOSABS 0.0 06/26/2020 1218    ASSESSMENT AND PLAN: 1. Pap smear for cervical cancer screening (Primary) - Cytology - PAP   Patient was given the opportunity to ask questions.  Patient verbalized understanding of the plan and was able to repeat key elements of the plan.   This  documentation was completed using Paediatric nurse.  Any transcriptional errors are unintentional.  No orders of the defined types were placed in this encounter.    Requested Prescriptions    No prescriptions requested or ordered in this encounter    Return in about 4 weeks (around 06/14/2024) for chronic ds management.  Barnie Louder, MD, FACP

## 2024-05-20 ENCOUNTER — Ambulatory Visit: Payer: Self-pay | Admitting: Internal Medicine

## 2024-05-20 LAB — CYTOLOGY - PAP
Comment: NEGATIVE
Diagnosis: NEGATIVE
High risk HPV: NEGATIVE

## 2024-05-20 NOTE — Telephone Encounter (Signed)
 Which medication ?

## 2024-05-21 ENCOUNTER — Other Ambulatory Visit (HOSPITAL_COMMUNITY): Payer: Self-pay

## 2024-05-21 MED ORDER — GABAPENTIN 300 MG PO CAPS
300.0000 mg | ORAL_CAPSULE | Freq: Three times a day (TID) | ORAL | 2 refills | Status: AC
Start: 2024-05-21 — End: ?
  Filled 2024-05-21: qty 270, 90d supply, fill #0

## 2024-05-21 NOTE — Addendum Note (Signed)
 Addended by: VICCI SOBER B on: 05/21/2024 06:12 PM   Modules accepted: Orders

## 2024-05-21 NOTE — Telephone Encounter (Signed)
 Mary [name],   This patient came in recently and requested a refill for the gabapentin  300 MG to be sent to [facility] for delivery. Please see if it can be delivered asap because it was an error on my end. Thank you in advance.

## 2024-05-22 ENCOUNTER — Ambulatory Visit: Admitting: Physician Assistant

## 2024-05-28 ENCOUNTER — Telehealth: Payer: Self-pay

## 2024-05-28 NOTE — Patient Outreach (Signed)
 Care Coordination   05/28/2024 Name: Mary [name] MRN: [id number] DOB: [birth year]   Care Coordination Outreach Attempts:  An unsuccessful outreach was attempted for an appointment today.  Follow Up Plan:  Additional outreach attempts will be made to complete CCM follow-up visit.   Encounter Outcome:  No Answer. HIPAA compliant voicemail left requesting return call.   Rosaline Finlay, RN MSN [place]  VBCI Population Health RN Care Manager Direct Dial: [phone]  Fax: [phone]

## 2024-05-31 ENCOUNTER — Other Ambulatory Visit: Payer: Self-pay

## 2024-05-31 NOTE — Patient Instructions (Signed)
 Visit Information  Mary [name] was given information about Medicaid Managed Care team care coordination services as a part of their [employer] Medicaid benefit.   If you would like to schedule transportation through your [employer] plan, please call the following number at least 2 days in advance of your appointment: [phone]  If you are experiencing a behavioral health crisis, call the [employer] [place]  Behavioral Health Crisis Line at [phone]). The line is available 24 hours a day, seven days a week.   Mary [name] - following are the goals we discussed in your visit today:    Goals Addressed             This Visit's Progress    COMPLETED: VBCI RN Care Plan   On track    Problems:  Chronic Disease Management support and education needs related to chronic diseases  Goal: Over the next 30 days the Mary [name] will demonstrate Ongoing adherence to prescribed treatment plan for CHF as evidenced by continuing to weigh daily and monitor for symptoms of CHF/fluid retention  Interventions:   Heart Failure Interventions: Assessed need for readable accurate scales in home Discussed importance of daily weight and advised Mary [name] to weigh and record daily Discussed the importance of keeping all appointments with provider   Mary [name] is requesting an order for rollator and PT referral. Message sent to PCP. Mary [name] reports she has not heard from community resource RN regarding letter of accommodation to help her move into downstairs apartment. Message sent to Slater, RN.  Mary [name] Self-Care Activities:  Attend all scheduled provider appointments Call provider office for new concerns or questions  Perform all self care activities independently  Take medications as prescribed   call office if I gain more than 2 pounds in one day or 5 pounds in one week watch for swelling in feet, ankles and legs every day weigh myself  daily  Plan:  The care management team will reach out to the Mary [name] again over the next 30 days to ensure she has spoken with Slater regarding letter for accommodation and to follow-up on rollator and PT referral request.          VBCI RN Care Plan related to SDOH needs   On track    Problems:  Care Coordination needs related to Food Insecurity  and Housing   Goal: Over the next 30 days the Mary [name] will work with Passenger transport manager to address letter of accommodation to move to a downstairs apartment as evidenced by review of electronic medical record and Mary [name] or care team member report    Interventions:   SDOH Barriers  Discussed plans with Mary [name] for ongoing care management follow up and provided Mary [name] with direct contact information for care management team Message sent to Slater Diesel, RN at PCP office, to request she call Mary [name] regarding letter of accommodation Mary [name] is requesting an order for rollator and PT referral. Message sent to PCP.  Mary [name] Self-Care Activities:  Attend all scheduled provider appointments Call provider office for new concerns or questions  Perform all self care activities independently  Take medications as prescribed    Plan:  The care management team will reach out to the Mary [name] again over the next 30 days.             Mary [name] verbalizes understanding of instructions and care plan provided today and agrees to view in MyChart. Active MyChart status and Mary [name] understanding of how to access instructions and care  plan via MyChart confirmed with Mary [name].     RN Care Manager will send message to PCP regarding order for rollator and PT referral; send message to Slater, RN regarding letter of accommodation to move to downstairs apartment The Managed Medicaid care management team will reach out to the Mary [name] again over the next 30 days.   Mary Finlay, RN MSN [place]  VBCI Population Health RN Care Manager Direct Dial:  [phone]  Fax: [phone]   Following is a copy of your plan of care:  There are no care plans that you recently modified to display for this Mary [name].

## 2024-05-31 NOTE — Patient Outreach (Signed)
 Complex Care Management   Visit Note  05/31/2024  Name:  Mary [name] MRN: [id number] DOB: 28-Nov-1960  Situation: Referral received for Complex Care Management related to high risk BH patient. I obtained verbal consent from Patient.  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    Protein calorie malnutrition (HCC)    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   33mm Sorin Carbomedics mechanical prosthesis via right mini thoracotomy approach   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   Complex valvuloplasty including Cor-matrix ECM patch augmentation of anterior and lateral leaflets with 26mm Edwards mc3 ring annuloplasty via right mini-thoracotomy approach   Sinus tachycardia    SVT (supraventricular tachycardia) (HCC) 03/16/2013   TIA (transient ischemic attack)     Assessment: Patient Reported Symptoms:  Cognitive Cognitive Status: Able to follow simple commands, Alert and oriented to person, place, and time, Normal speech and language skills Cognitive/Intellectual Conditions Management [RPT]: None reported or documented in medical history or problem list      Neurological Neurological Review of Symptoms: No symptoms reported    HEENT HEENT Symptoms Reported: No symptoms reported HEENT Management Strategies: Medical device,  Routine screening HEENT Comment: Patient notes that she did receive upper and lower partial plate and they are working well for her    Cardiovascular Cardiovascular Symptoms Reported: No symptoms reported Does patient have uncontrolled Hypertension?: No Cardiovascular Management Strategies: Weight management, Medication therapy, Exercise Do You Have a Working Readable Scale?: Yes Weight: 146 lb (66.2 kg) (Patient reported)  Respiratory Respiratory Symptoms Reported: No symptoms reported    Endocrine Endocrine Symptoms Reported: No symptoms reported Is patient diabetic?: No    Gastrointestinal Gastrointestinal Symptoms Reported: No symptoms reported Additional Gastrointestinal Details: Patient reports no further issues related to hemorrhoids. Last BM this morning Gastrointestinal Management Strategies: Coping strategies    Genitourinary Genitourinary Symptoms Reported: No symptoms reported    Integumentary Integumentary Symptoms Reported: No symptoms reported    Musculoskeletal Musculoskelatal Symptoms Reviewed: Unsteady gait Musculoskeletal Management Strategies: Medical device, Adequate rest, Exercise Musculoskeletal Comment: Patient reports visit with PCP 05/17/24 was only for a PAP smear. She did not get a chance to discuss rollator or PT referral at that time. Message sent to PCP. Note that she does have PCP follow-up on 9/2/525. Falls in the past year?: No Number of falls in past year: 1 or less Was there an injury with Fall?: No Fall Risk Category Calculator: 0 Patient Fall Risk Level: Low Fall Risk Patient at Risk for Falls Due to: Impaired balance/gait Fall risk Follow up: Falls evaluation completed, Education provided  Psychosocial Psychosocial Symptoms Reported: No symptoms reported          05/31/2024    PHQ2-9  Depression Screening   Little interest or pleasure in doing things Not at all  Feeling down, depressed, or hopeless Not at all  PHQ-2 - Total Score 0  Trouble  falling or staying asleep, or sleeping too much    Feeling tired or having little energy    Poor appetite or overeating     Feeling bad about yourself - or that you are a failure or have let yourself or your family down    Trouble concentrating on things, such as reading the newspaper or watching television    Moving or speaking so slowly that other people could have noticed.  Or the opposite - being so fidgety or restless that you have been moving around a lot more than usual    Thoughts that you would be better off dead, or hurting yourself in some way    PHQ2-9 Total Score    If you checked off any problems, how difficult have these problems made it for you to do your work, take care of things at home, or get along with other people    Depression Interventions/Treatment      There were no vitals filed for this visit.  Medications Reviewed Today     Reviewed by Arno Rosaline SQUIBB, RN (Registered Nurse) on 05/31/24 at 1531  Med List Status: <None>   Medication Order Taking? Sig Documenting Provider Last Dose Status Informant  acetaminophen  (TYLENOL ) 500 MG tablet [id number]  Take 1,000 mg by mouth in the morning, at noon, and at bedtime. [provider]  Active Self  folic acid  (FOLVITE ) 1 MG tablet [id number]  Take 1 tablet (1 mg total) by mouth daily. Please keep upcoming appointment for more refills. Vicci Barnie NOVAK, MD  Active   gabapentin  (NEURONTIN ) 300 MG capsule [id number]  Take 1 capsule (300 mg total) by mouth 3 (three) times daily. Vicci Barnie NOVAK, MD  Active   latanoprost  (XALATAN ) 0.005 % ophthalmic solution [id number]  Place 1 drop into both eyes at bedtime. Pavero, Lonni, [facility]  Active   pantoprazole  (PROTONIX ) 40 MG tablet [id number]  Take 1 tablet (40 mg total) by mouth 2 (two) times daily before a meal. Vicci Barnie NOVAK, MD  Active   pantoprazole  (PROTONIX ) 40 MG tablet [id number]  Take 1 tablet (40 mg total) by mouth 2 (two) times daily before a meal.  Vicci Barnie NOVAK, MD  Active   thiamine  (VITAMIN B1) 100 MG tablet [id number]  Take 1 tablet (100 mg total) by mouth daily. Jeffrie Oneil BROCKS, MD  Active   traMADol  (ULTRAM ) 50 MG tablet [id number]  Take 1 tablet (50 mg total) by mouth at bedtime as needed.  Patient not taking: Reported on 05/17/2024   Newlin, Enobong, MD  Active   Vitamin D , Ergocalciferol , (DRISDOL ) 1.25 MG (50000 UNIT) CAPS capsule [id number]  Take 1 capsule (50,000 Units total) by mouth once a week. For 12 weeks, then recheck level in the office   Active   warfarin (COUMADIN ) 5 MG tablet [id number]  Take 1-1&1/2 tablets (5-7.5 mg total) by mouth daily as directed by Coumadin  clinic. Jeffrie Oneil BROCKS, MD  Active             Recommendation:   PCP Follow-up Continue Current Plan of Care Requesting referral for PT and rollator  Follow Up Plan:   Telephone follow-up in 1 month  Rosaline Arno, RN MSN [place]  [facility] RN Care Manager Direct Dial: [phone]  Fax: [phone]

## 2024-06-04 ENCOUNTER — Ambulatory Visit

## 2024-06-05 ENCOUNTER — Other Ambulatory Visit: Payer: Self-pay | Admitting: Internal Medicine

## 2024-06-05 NOTE — Telephone Encounter (Unsigned)
 Copied from CRM [id number]. Topic: Clinical - Medication Refill >> Jun 05, 2024  1:01 PM Delon DASEN wrote: Medication: folic acid  (FOLVITE ) 1 MG tablet  Has the patient contacted their pharmacy? No (Agent: If no, request that the patient contact the pharmacy for the refill. If patient does not wish to contact the pharmacy document the reason why and proceed with request.) (Agent: If yes, when and what did the pharmacy advise?)  This is the patient's preferred pharmacy:   [place] [facility] [address] [place] [address] Phone: [phone] Fax: [phone]    Is this the correct pharmacy for this prescription? Yes If no, delete pharmacy and type the correct one.   Has the prescription been filled recently? Yes  Is the patient out of the medication? Yes  Has the patient been seen for an appointment in the last year OR does the patient have an upcoming appointment? Yes  Can we respond through MyChart? No  Agent: Please be advised that Rx refills may take up to 3 business days. We ask that you follow-up with your pharmacy.

## 2024-06-05 NOTE — Telephone Encounter (Signed)
 FYI Only or Action Required?: Action required by provider: medication refill request.  Patient was last seen in primary care on 05/17/2024 by Vicci Barnie NOVAK, MD.  Called Nurse Triage reporting No chief complaint on file..  Symptoms began today.  Interventions attempted: Nothing.  Symptoms are: stable.  Triage Disposition: No disposition on file.  Patient/caregiver understands and will follow disposition?:

## 2024-06-06 ENCOUNTER — Other Ambulatory Visit: Payer: Self-pay

## 2024-06-06 ENCOUNTER — Telehealth: Payer: Self-pay

## 2024-06-06 ENCOUNTER — Other Ambulatory Visit (HOSPITAL_COMMUNITY): Payer: Self-pay

## 2024-06-06 MED ORDER — FOLIC ACID 1 MG PO TABS
1.0000 mg | ORAL_TABLET | Freq: Every day | ORAL | 1 refills | Status: DC
  Filled 2024-06-06 – 2024-06-13 (×2): qty 30, 30d supply, fill #0
  Filled 2024-07-15: qty 30, 30d supply, fill #1

## 2024-06-06 NOTE — Telephone Encounter (Signed)
 Requested Prescriptions  Pending Prescriptions Disp Refills   folic acid  (FOLVITE ) 1 MG tablet 30 tablet 1    Sig: Take 1 tablet (1 mg total) by mouth daily. Please keep upcoming appointment for more refills.     Endocrinology:  Vitamins Passed - 06/06/2024  1:56 PM      Passed - Valid encounter within last 12 months    Recent Outpatient Visits           2 weeks ago Pap smear for cervical cancer screening   [facility] [place] Vicci Barnie NOVAK, MD   6 months ago SOB (shortness of breath)   [place] [facility] [place]. [facility] Vicci Barnie NOVAK, MD   10 months ago Alcoholic peripheral neuropathy [facility])   [place] [facility] [place] [facility] [place]. [facility] Vicci Barnie NOVAK, MD   1 year ago Hospital discharge follow-up   [place] [facility] [place]. [facility] Vicci Barnie NOVAK, MD   1 year ago Overweight   [place] [facility] [place] [place]. [facility] Delbert Clam, MD       Future Appointments             In 5 days Valdemar Rocky SAUNDERS, NP [place]   In 2 weeks Lucien, Orren SAILOR, PA-C [facility] at [employer] [facility] [employer]. [facility] [employer], [employer]   In 2 weeks Vicci Barnie NOVAK, MD [facility] [place] [facility] [place]. [facility], [place]

## 2024-06-06 NOTE — Telephone Encounter (Signed)
 At the request of Rosaline Finlay, RN /VBCI, I called the patient about her request for a first floor apartment.    She explained that she is currently living in a 2nd floor apartment; but due to her multiple medical issues,including her history of a stroke, she would be safer in a first floor apartment. She has 3 steps into the building and then 2 flights of stairs inside the building to navigate to her apartment.   She has lived in the current complex since 2011 and would like to stay there. She had issues with paying her rent in the past but is current with her rent.  She said that the landlord told her that they won't let her move to a first floor apartment due to her  rental history.  She stated that the rent on the first floor is the same as her rent on the 2nd floor and they have not asked her to move because of her rental history. She does not understand why she is not permitted to move.   She was inquiring about having Dr Vicci write a letter to her landlord outlining the need for a 1st floor apartment.  I explained to her that it may be best to check with the Legal Aid housing attorneys to see if they have any recommendations how to best address this request with the landlord.  We have referred the patient to [employer] [facility] [place] in the past for assistance with her housing situation and she is agreeable to placing another referral.  I told her that I will place that referral and if she has not heard from them by the middle of next week to please call me and she said she would.   She then asked about getting a lightweight rollator and I told her that she can discuss that with Dr Vicci at her upcoming appointment.  The insurance company will need documentation in an office note supporting the need for that piece of equipment.  I told her that I would add that request to the appointment notes and she was very appreciative.

## 2024-06-07 ENCOUNTER — Ambulatory Visit: Admitting: Family

## 2024-06-07 ENCOUNTER — Other Ambulatory Visit (HOSPITAL_COMMUNITY): Payer: Self-pay

## 2024-06-10 ENCOUNTER — Other Ambulatory Visit (HOSPITAL_COMMUNITY): Payer: Self-pay

## 2024-06-11 ENCOUNTER — Encounter: Payer: Self-pay | Admitting: Family

## 2024-06-11 ENCOUNTER — Other Ambulatory Visit: Payer: Self-pay

## 2024-06-11 ENCOUNTER — Ambulatory Visit (INDEPENDENT_AMBULATORY_CARE_PROVIDER_SITE_OTHER): Admitting: Family

## 2024-06-11 DIAGNOSIS — B351 Tinea unguium: Secondary | ICD-10-CM

## 2024-06-11 NOTE — Progress Notes (Signed)
 Office Visit Note   Patient: Mary [name]           Date of Birth: [birth year]           MRN: [id number] Visit Date: 06/11/2024              Requested by: Vicci Barnie NOVAK, MD [address] [place],  [place] [address] PCP: Vicci Barnie NOVAK, MD  Chief Complaint  Patient presents with   Right Foot - Follow-up   Left Foot - Follow-up      HPI: The patient is a 63 year old woman who presents today for evaluation of bilateral feet.   She is unfortunately no longer able to safely trim her own nails due to history of a CVA as well as neuropathy  Assessment & Plan: Visit Diagnoses: No diagnosis found.  Plan: Nails trimmed x 10.  Patient tolerated well.  She will follow-up in 3 months for reevaluation.  Follow-Up Instructions: No follow-ups on file.   Ortho Exam  Patient is alert, oriented, no adenopathy, well-dressed, normal affect, normal respiratory effort. On examination bilateral feet she does have thickened and discolored onychomycotic nails x 10 there are no calluses ulcers or signs of impending ulceration.  Nails were trimmed x 10 after informed consent.  Patient tolerated well.  Imaging: No results found. No images are attached to the encounter.  Labs: Lab Results  Component Value Date   HGBA1C 4.2 (L) 08/18/2020   HGBA1C 4.8 06/06/2016   HGBA1C 5.4 09/13/2015   ESRSEDRATE 14 08/18/2020   ESRSEDRATE 21 06/03/2020   CRP <0.5 08/18/2020   REPTSTATUS 12/13/2022 FINAL 12/08/2022   GRAMSTAIN  08/19/2020    WBC PRESENT, PREDOMINANTLY MONONUCLEAR NO ORGANISMS SEEN CYTOSPIN SMEAR    CULT  12/08/2022    NO GROWTH 5 DAYS Performed at [facility], [address], [place], [place] [address]    LABORGA NO GROWTH 5 DAYS 05/09/2013   LABORGA NO GROWTH 5 DAYS 05/09/2013     Lab Results  Component Value Date   ALBUMIN  4.3 07/17/2023   ALBUMIN  3.1 (L) 01/24/2023   ALBUMIN  2.9 (L) 12/07/2022    Lab Results  Component Value Date   MG 1.8  12/07/2022   MG 2.0 12/05/2022   MG 2.0 07/19/2022   Lab Results  Component Value Date   VD25OH 6.4 (L) 06/03/2020    No results found for: PREALBUMIN    Latest Ref Rng & Units 11/21/2023    3:12 PM 07/17/2023   12:08 PM 01/25/2023    7:22 AM  CBC EXTENDED  WBC 3.4 - 10.8 x10E3/uL 8.6  9.9  6.7   RBC 3.77 - 5.28 x10E6/uL 3.59  3.76  2.97   Hemoglobin 11.1 - 15.9 g/dL 88.7  87.8  9.8   HCT 65.9 - 46.6 % 34.8  37.5  31.3   Platelets 150 - 450 x10E3/uL 353  406  260      There is no height or weight on file to calculate BMI.  Orders:  No orders of the defined types were placed in this encounter.  No orders of the defined types were placed in this encounter.    Procedures: No procedures performed  Clinical Data: No additional findings.  ROS:  All other systems negative, except as noted in the HPI. Review of Systems  Objective: Vital Signs: LMP 01/28/2013   Specialty Comments:  No specialty comments available.  PMFS History: Patient Active Problem List   Diagnosis Date  Noted   Alcohol use disorder, moderate, in early remission (HCC) 07/17/2023   Hypercoagulable state (HCC) 07/17/2023   Glaucoma (increased eye pressure) 01/24/2023   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   Pharyngitis 12/06/2022   Neuropathy 12/06/2022   Tachycardia 12/05/2022   Supratherapeutic INR 12/05/2022   Hyperkalemia 12/05/2022   Hypotension due to hypovolemia 07/19/2022   Prolonged QT interval 07/19/2022   Marijuana user 12/24/2020   LFT elevation    Acquired pyloric stenosis    GI bleed 11/24/2020   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   Thiamin deficiency 09/01/2020   Paresthesia    Vitamin B12 deficiency 05/04/2020   Vomiting 04/24/2020   GERD (gastroesophageal reflux disease)    Marijuana abuse    Macrocytic anemia 04/04/2019   Intermittent complete heart block (HCC)    Syncope 03/28/2019   Orthostatic hypotension 01/11/2017   Elevated liver enzymes 01/11/2017    Atypical chest pain 06/05/2016   Acute renal failure superimposed on stage 3a chronic kidney disease (HCC) 01/10/2016   Elevated INR 01/10/2016   TIA (transient ischemic attack) 09/13/2015   CVA (cerebral vascular accident) (HCC) 09/12/2015   SOB (shortness of breath) 02/24/2014   First degree heart block 06/17/2013   (HFpEF) heart failure with preserved ejection fraction (HCC) 06/16/2013   History of bacterial endocarditis 06/16/2013   Warfarin anticoagulation 06/10/2013   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   Femoral hernia 05/23/2013   Mitral valve insufficiency 04/22/2013   Tricuspid regurgitation 04/22/2013   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   SVT (supraventricular tachycardia) (HCC) 03/16/2013   Past Medical History:  Diagnosis Date   Alcoholism (HCC)    B12 deficiency    Carotid stenosis    Carotid US  2/17: bilateral ICA 1-39% >> FU prn   Cataract    OU   Childhood asthma    Chronic diastolic CHF (congestive heart failure) (HCC)    Complete heart block (HCC)    Epigastric hernia 200's   GERD (gastroesophageal reflux disease)    Glaucoma suspect    History of blood transfusion    14 w/1st pregnancy; 2 w/last C-section (03/15/2013)   Hypertension    no pcp   will go to [facility]   Macrocytic anemia    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    Protein calorie malnutrition (HCC)    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   33mm Sorin Carbomedics mechanical prosthesis via right mini thoracotomy approach   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   Complex valvuloplasty including Cor-matrix ECM patch augmentation of anterior and lateral leaflets with 26mm Edwards mc3 ring annuloplasty via right mini-thoracotomy approach   Sinus tachycardia    SVT (supraventricular tachycardia) (HCC) 03/16/2013   TIA (transient ischemic attack)     Family History   Problem Relation Age of Onset   Kidney disease Mother        Had one Kidney removed   Stroke Father    Cancer Father    Sarcoidosis Sister    Heart attack Neg Hx    Colon cancer Neg Hx    Stomach cancer Neg Hx    Pancreatic cancer Neg Hx    Esophageal cancer Neg Hx     Past Surgical History:  Procedure Laterality Date   APPENDECTOMY  ~1978   BALLOON DILATION N/A 11/27/2020   Procedure: BALLOON DILATION;  Surgeon: Avram Lupita BRAVO, MD;  Location: [facility] ENDOSCOPY;  Service: Endoscopy;  Laterality: N/A;   BIOPSY  05/01/2020   Procedure: BIOPSY;  Surgeon: Teressa Toribio SQUIBB, MD;  Location[facility] ENDOSCOPY;  Service: Endoscopy;;   CARDIAC CATHETERIZATION     CARDIAC VALVE REPLACEMENT     CESAREAN SECTION  1983   CESAREAN SECTION WITH BILATERAL TUBAL LIGATION  1999   COLONOSCOPY WITH PROPOFOL  N/A 05/01/2020   Procedure: COLONOSCOPY WITH PROPOFOL ;  Surgeon: Teressa Toribio SQUIBB, MD;  Location: [facility] ENDOSCOPY;  Service: Endoscopy;  Laterality: N/A;   ESOPHAGOGASTRODUODENOSCOPY (EGD) WITH PROPOFOL  N/A 05/01/2020   Procedure: ESOPHAGOGASTRODUODENOSCOPY (EGD) WITH PROPOFOL ;  Surgeon: Teressa Toribio SQUIBB, MD;  Location: [facility] ENDOSCOPY;  Service: Endoscopy;  Laterality: N/A;   ESOPHAGOGASTRODUODENOSCOPY (EGD) WITH PROPOFOL  N/A 11/27/2020   Procedure: ESOPHAGOGASTRODUODENOSCOPY (EGD) WITH PROPOFOL ;  Surgeon: Avram Lupita BRAVO, MD;  Location: [facility] ENDOSCOPY;  Service: Endoscopy;  Laterality: N/A;   FEMORAL HERNIA REPAIR Right 05/23/2013   Procedure: HERNIA REPAIR FEMORAL;  Surgeon: Sudie VEAR Laine, MD;  Location[facility] OR;  Service: Open Heart Surgery;  Laterality: Right;   INTRAOPERATIVE TRANSESOPHAGEAL ECHOCARDIOGRAM N/A 05/23/2013   Procedure: INTRAOPERATIVE TRANSESOPHAGEAL ECHOCARDIOGRAM;  Surgeon: Sudie VEAR Laine, MD;  Location[facility] OR;  Service: Open Heart Surgery;  Laterality: N/A;   LAPAROSCOPIC CHOLECYSTECTOMY  2003   LEFT AND RIGHT HEART CATHETERIZATION WITH CORONARY ANGIOGRAM N/A 03/22/2013   Procedure: LEFT AND RIGHT HEART  CATHETERIZATION WITH CORONARY ANGIOGRAM;  Surgeon: Lonni JONETTA Cash, MD;  [facility] CATH LAB;  Service: Cardiovascular;  Laterality: N/A;   MINIMALLY INVASIVE TRICUSPID VALVE REPAIR Right 05/23/2013   Procedure: MINIMALLY INVASIVE TRICUSPID VALVE REPAIR;  Surgeon: Sudie VEAR Laine, MD;  Location: [facility] OR;  Service: Open Heart Surgery;  Laterality: Right;   MITRAL VALVE REPLACEMENT N/A 05/23/2013   Procedure: MITRAL VALVE (MV) REPLACEMENT;  Surgeon: Sudie VEAR Laine, MD;  Location: [facility] OR;  Service: Open Heart Surgery;  Laterality: N/A;   MULTIPLE EXTRACTIONS WITH ALVEOLOPLASTY N/A 04/04/2013   Procedure: Extraction of tooth #'s 1,8,9,13,14,15,23,24,25,26 with alveoloplasty and gross debridement of remaining teeth;  Surgeon: Tanda JULIANNA Fanny, DDS;  Location: [facility] OR;  Service: Oral Surgery;  Laterality: N/A;   TEE WITHOUT CARDIOVERSION N/A 03/18/2013   Procedure: TRANSESOPHAGEAL ECHOCARDIOGRAM (TEE);  Surgeon: Ezra GORMAN Shuck, MD;  Location: [facility] ENDOSCOPY;  Service: Cardiovascular;  Laterality: N/A;   TEE WITHOUT CARDIOVERSION N/A 06/17/2013   Procedure: TRANSESOPHAGEAL ECHOCARDIOGRAM (TEE);  Surgeon: Redell GORMAN Shallow, MD;  Location: [facility] ENDOSCOPY;  Service: Cardiovascular;  Laterality: N/A;   TUBAL LIGATION  1999   Social History   Occupational History   Occupation: unemployed  Tobacco Use   Smoking status: Former    Current packs/day: 0.00    Average packs/day: 0.5 packs/day for 46.0 years (23.0 ttl pk-yrs)    Types: Cigarettes    Start date: 82    Quit date: 2022    Years since quitting: 3.7   Smokeless tobacco: Never  Vaping Use   Vaping status: Never Used  Substance and Sexual Activity   Alcohol use: Not Currently    Comment:  glass of wine   Drug use: Yes    Types: Marijuana    Comment: chews on it periodically   Sexual activity: Never

## 2024-06-13 ENCOUNTER — Other Ambulatory Visit: Payer: Self-pay | Admitting: Cardiology

## 2024-06-13 ENCOUNTER — Other Ambulatory Visit (HOSPITAL_COMMUNITY): Payer: Self-pay

## 2024-06-13 ENCOUNTER — Other Ambulatory Visit: Payer: Self-pay

## 2024-06-13 ENCOUNTER — Other Ambulatory Visit: Payer: Self-pay | Admitting: Internal Medicine

## 2024-06-13 DIAGNOSIS — Z954 Presence of other heart-valve replacement: Secondary | ICD-10-CM

## 2024-06-14 ENCOUNTER — Other Ambulatory Visit: Payer: Self-pay

## 2024-06-14 ENCOUNTER — Telehealth: Payer: Self-pay | Admitting: Cardiology

## 2024-06-14 ENCOUNTER — Ambulatory Visit

## 2024-06-14 ENCOUNTER — Other Ambulatory Visit (HOSPITAL_COMMUNITY): Payer: Self-pay

## 2024-06-14 DIAGNOSIS — Z954 Presence of other heart-valve replacement: Secondary | ICD-10-CM

## 2024-06-14 MED ORDER — WARFARIN SODIUM 5 MG PO TABS
5.0000 mg | ORAL_TABLET | Freq: Every day | ORAL | 0 refills | Status: DC
  Filled 2024-06-14: qty 20, 13d supply, fill #0

## 2024-06-14 NOTE — Telephone Encounter (Signed)
*  STAT* If patient is at the pharmacy, call can be transferred to refill team.   1. Which medications need to be refilled? (please list name of each medication and dose if known)  warfarin (COUMADIN ) 5 MG tablet  2. Which pharmacy/location (including street and city if local pharmacy) is medication to be sent to? [place] [facility]  3. Do they need a 30 day or 90 day supply?  90 supply

## 2024-06-17 ENCOUNTER — Other Ambulatory Visit: Payer: Self-pay

## 2024-06-18 NOTE — Telephone Encounter (Signed)
 I called the patient to inquire if she has heard from Legal [employer] of [place] [facility]) about moving to a first floor apartment. She said she was contacted today by Chris/ [facility] and was instructed to call Norleen Sar, Attorney/ [facility] [place].   The patient said she called Norleen and had to leave a message for him requesting a call back.

## 2024-06-19 ENCOUNTER — Telehealth: Payer: Self-pay | Admitting: Internal Medicine

## 2024-06-19 NOTE — Progress Notes (Unsigned)
 Cardiology Office Note   Date:  06/20/2024  ID:  Mary, [name] September 05, 1961, MRN [id number] PCP: Vicci Barnie NOVAK, MD  [facility] Providers Cardiologist:  Oneil Parchment, MD Cardiology APP:  Lelon Glendia DASEN, PA-C  Electrophysiologist:  Danelle Birmingham, MD   History of Present Illness Mary [name] is a 63 y.o. female with a past medical history of CHF status post mechanical valve replacement previously followed by Dr. Mady and Dr. Rolan but last seen by Dr. Parchment 3 years ago here for follow-up appointment.  An implantable loop recorder was recommended back then but insurance would not cover it.  She did have some dark stools and was recommended to go to the emergency room after she was seen back in April 2021.  Patient declined at that time.  Hemoglobin was 10.6.  Patient was seen by gastroenterology and Lovenox  bridge for any endoscopies was needed.  When she was last seen she was feeling fine and reported her experience has been a good battle for her.  She states that it takes her a little longer than usual to cook and clean bathrooms.  She has a good sense of humor about her mechanical valve.  She denied any chest pain/tightness or pressure, palpitations, shortness of breath, lower extremity edema, dizziness, fatigue, lightheadedness, nausea vomiting, or syncope.  Today, she presents with a history of a mechanical heart valve with palpitations and chest pain.  She experiences intermittent chest pain described as a 'small little squeeze' that is short-lived and occurs while sitting. The pain is not associated with physical activity. Her heart rate feels consistently elevated, which she attributes to anxiety. She has a mechanical heart valve implanted in 2014 and hears it clicking, particularly at night. Occasional tingling in her arm occurs, which she alleviates by rubbing. She experiences shortness of breath with activity. Her medical history includes episodes of syncope since  childhood, linked to anxiety and heart issues, and an incident in 2014 of irregular heart rhythm. Current medications include Coumadin , gabapentin , vitamin D , thiamine , folic acid , and pantoprazole . She recently stopped tramadol  due to adverse effects and now uses Tylenol  for neuropathy pain.  No edema, orthopnea, PND.    ROS: Pertinent ROS in HPI  Studies Reviewed EKG Interpretation Date/Time:  Thursday June 20 2024 08:07:47 EDT Ventricular Rate:  103 PR Interval:  162 QRS Duration:  74 QT Interval:  362 QTC Calculation: 474 R Axis:   69  Text Interpretation: Sinus tachycardia Possible Anterior infarct , age undetermined When compared with ECG of 23-Jan-2023 23:27, PREVIOUS ECG IS PRESENT Confirmed by Lucien Blanc [phone]) on 06/20/2024 8:22:31 AM    Echocardiogram 12/05/2023 IMPRESSIONS     1. Left ventricular ejection fraction, by estimation, is 50 to 55%. The  left ventricle has low normal function. The left ventricle has no regional  wall motion abnormalities. Left ventricular diastolic parameters are  indeterminate.   2. Right ventricular systolic function is mildly reduced. The right  ventricular size is normal.   3. Left atrial size was moderately dilated.   4. The mitral valve is degenerative. No evidence of mitral valve  regurgitation. No evidence of mitral stenosis. The mean mitral valve  gradient is 4.0 mmHg. There is a 33 mm [employer] Sorin Optiform  mechanical valve present in the mitral position.  Procedure Date: 05/23/13.   5. The tricuspid valve is has been repaired/replaced. The tricuspid valve  is status post repair with an annuloplasty ring.   6. The aortic valve is  normal in structure. Aortic valve regurgitation is  not visualized. No aortic stenosis is present.   7. The inferior vena cava is normal in size with greater than 50%  respiratory variability, suggesting right atrial pressure of 3 mmHg.   FINDINGS   Left Ventricle: Left ventricular  ejection fraction, by estimation, is 50  to 55%. The left ventricle has low normal function. The left ventricle has  no regional wall motion abnormalities. The left ventricular internal  cavity size was normal in size.  There is no left ventricular hypertrophy. Left ventricular diastolic  parameters are indeterminate.   Right Ventricle: The right ventricular size is normal. No increase in  right ventricular wall thickness. Right ventricular systolic function is  mildly reduced.   Left Atrium: Left atrial size was moderately dilated.   Right Atrium: Right atrial size was normal in size.   Pericardium: There is no evidence of pericardial effusion.   Mitral Valve: The mitral valve is degenerative in appearance. No evidence  of mitral valve regurgitation. There is a 33 mm [employer] Sorin Optiform  mechanical valve present in the mitral position. Procedure Date: 05/23/13.  No evidence of mitral valve  stenosis. MV peak gradient, 7.8 mmHg. The mean mitral valve gradient is  4.0 mmHg.   Tricuspid Valve: The tricuspid valve is has been repaired/replaced.  Tricuspid valve regurgitation is trivial. No evidence of tricuspid  stenosis. The tricuspid valve is status post repair with an annuloplasty  ring.   Aortic Valve: The aortic valve is normal in structure. Aortic valve  regurgitation is not visualized. No aortic stenosis is present.   Pulmonic Valve: The pulmonic valve was normal in structure. Pulmonic valve  regurgitation is trivial. No evidence of pulmonic stenosis.   Aorta: The aortic root is normal in size and structure.   Venous: The inferior vena cava is normal in size with greater than 50%  respiratory variability, suggesting right atrial pressure of 3 mmHg.   IAS/Shunts: No atrial level shunt detected by color flow Doppler.         Physical Exam VS:  BP (!) 140/82   Pulse (!) 103   Ht 5' 2 (1.575 m)   Wt 146 lb (66.2 kg)   LMP 01/28/2013   SpO2 91%   BMI 26.70  kg/m        Wt Readings from Last 3 Encounters:  06/20/24 146 lb (66.2 kg)  06/20/24 146 lb (66.2 kg)  05/31/24 146 lb (66.2 kg)    GEN: Well nourished, well developed in no acute distress NECK: No JVD; No carotid bruits CARDIAC: RRR, no murmurs, rubs, gallops RESPIRATORY:  Clear to auscultation without rales, wheezing or rhonchi  ABDOMEN: Soft, non-tender, non-distended EXTREMITIES:  No edema; No deformity   ASSESSMENT AND PLAN  Palpitations, tachycardia, and syncope  Intermittent palpitations and tachycardia with syncope. Possible SVT suspected. Symptoms may be exacerbated by anxiety. Ejection fraction 50-55%. - Order heart monitor to evaluate heart rhythm and correlate with symptoms. - Prescribe carvedilol  3.125 mg to be started after heart monitor results are obtained. - Educated on the use of the heart monitor, including pressing the button during symptomatic episodes.  Chronic diastolic heart failure with preserved ejection fraction Chronic diastolic heart failure with preserved ejection fraction. Heart rate management is crucial to reduce cardiac workload and prevent further decline in heart function. - Monitor heart rate and rhythm with heart monitor. - Initiate carvedilol  therapy post-monitoring to manage heart rate and reduce cardiac workload.  Chest pain and  shortness of breath Intermittent chest pain and shortness of breath at rest. Pain is short-lived and not associated with physical activity, suggesting a non-cardiac origin. Potential link to heart rhythm abnormalities. - Monitor symptoms and heart rhythm with heart monitor. - Consider coronary CT scan if symptoms become more frequent or prolonged.  Anxiety symptoms Long-standing anxiety potentially contributing to palpitations and tachycardia  Mechanical mitral valve procedure date August 2014 Recent echocardiogram back in March reviewed with the patient -Remains on chronic Coumadin , recent INR was done last  month, 2.0     Dispo: She can follow-up in 6 months with Dr. Jeffrie or myself  Signed, Orren LOISE Fabry, PA-C

## 2024-06-19 NOTE — Telephone Encounter (Signed)
 Lvm to confirm appt

## 2024-06-20 ENCOUNTER — Ambulatory Visit (INDEPENDENT_AMBULATORY_CARE_PROVIDER_SITE_OTHER)

## 2024-06-20 ENCOUNTER — Encounter: Payer: Self-pay | Admitting: Internal Medicine

## 2024-06-20 ENCOUNTER — Encounter: Payer: Self-pay | Admitting: Physician Assistant

## 2024-06-20 ENCOUNTER — Ambulatory Visit: Attending: Internal Medicine | Admitting: Internal Medicine

## 2024-06-20 ENCOUNTER — Ambulatory Visit: Attending: Physician Assistant | Admitting: Physician Assistant

## 2024-06-20 ENCOUNTER — Other Ambulatory Visit: Payer: Self-pay

## 2024-06-20 ENCOUNTER — Other Ambulatory Visit (HOSPITAL_COMMUNITY): Payer: Self-pay

## 2024-06-20 VITALS — BP 121/84 | HR 104 | Temp 98.2°F | Ht 62.0 in | Wt 146.0 lb

## 2024-06-20 VITALS — BP 140/82 | HR 103 | Ht 62.0 in | Wt 146.0 lb

## 2024-06-20 DIAGNOSIS — R030 Elevated blood-pressure reading, without diagnosis of hypertension: Secondary | ICD-10-CM | POA: Diagnosis not present

## 2024-06-20 DIAGNOSIS — R002 Palpitations: Secondary | ICD-10-CM | POA: Diagnosis not present

## 2024-06-20 DIAGNOSIS — I052 Rheumatic mitral stenosis with insufficiency: Secondary | ICD-10-CM | POA: Diagnosis not present

## 2024-06-20 DIAGNOSIS — N1832 Chronic kidney disease, stage 3b: Secondary | ICD-10-CM

## 2024-06-20 DIAGNOSIS — G629 Polyneuropathy, unspecified: Secondary | ICD-10-CM | POA: Diagnosis not present

## 2024-06-20 DIAGNOSIS — I38 Endocarditis, valve unspecified: Secondary | ICD-10-CM

## 2024-06-20 DIAGNOSIS — Z7901 Long term (current) use of anticoagulants: Secondary | ICD-10-CM | POA: Insufficient documentation

## 2024-06-20 DIAGNOSIS — E538 Deficiency of other specified B group vitamins: Secondary | ICD-10-CM | POA: Diagnosis not present

## 2024-06-20 DIAGNOSIS — F1090 Alcohol use, unspecified, uncomplicated: Secondary | ICD-10-CM

## 2024-06-20 DIAGNOSIS — Z954 Presence of other heart-valve replacement: Secondary | ICD-10-CM | POA: Insufficient documentation

## 2024-06-20 DIAGNOSIS — R269 Unspecified abnormalities of gait and mobility: Secondary | ICD-10-CM

## 2024-06-20 DIAGNOSIS — E559 Vitamin D deficiency, unspecified: Secondary | ICD-10-CM

## 2024-06-20 DIAGNOSIS — R809 Proteinuria, unspecified: Secondary | ICD-10-CM | POA: Diagnosis not present

## 2024-06-20 DIAGNOSIS — Z5181 Encounter for therapeutic drug level monitoring: Secondary | ICD-10-CM | POA: Diagnosis not present

## 2024-06-20 DIAGNOSIS — I5032 Chronic diastolic (congestive) heart failure: Secondary | ICD-10-CM | POA: Insufficient documentation

## 2024-06-20 MED ORDER — VITAMIN D (ERGOCALCIFEROL) 1.25 MG (50000 UNIT) PO CAPS: ORAL_CAPSULE | ORAL | 0 refills | Status: AC

## 2024-06-20 MED ORDER — CARVEDILOL 3.125 MG PO TABS
3.1250 mg | ORAL_TABLET | Freq: Two times a day (BID) | ORAL | 3 refills | Status: AC
  Filled 2024-06-20: qty 180, 90d supply, fill #0

## 2024-06-20 NOTE — Progress Notes (Signed)
 Patient ID: Mary [name], female    DOB: 04/13/1961  MRN: [id number]  CC: Follow-up (Follow-up. Med refill. Layvonne rollator walker with seat/No to flu & pneumonia vax)   Subjective: Mary [name] is a 63 y.o. female who presents for chronic ds management. 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  History of Present Illness [name] is a 63 year old female with valvular heart disease and chronic kidney disease who presents for follow-up of chronic disease management.  CHF/VHD: Saw cardiology NP this a.m and had a heart monitor placed. Scheduled to wear it for 14 days. She has been prescribed carvedilol  but will start it after the heart monitor is removed. She continues to attend the Coumadin  clinic for anticoagulation management.  Vit B12/Thiamine  def/Peripheral neuropathy: She has a history of vitamin B12 and thiamine  deficiencies. She is currently taking thiamine  daily but though she did not have to take B12 supplement anymore. B12 last check October 2024, level was good; pt was advise to continue taking B12 supplement OCT.  She continues to have numbness and tingling in her extremities and lips, which she associates with her B12 deficiency. She is taking gabapentin  three times a day for these symptoms. he reports difficulty with balance and walking, requiring a cane, and has requested a rollator walker to assist with mobility at home. No recent falls.   AUD: She has a history of alcohol use disorder but reports significant reduction in alcohol consumption  since 2014, with only a single recent intake of a cooler. She quit smoking in 2014.  CKD 3: She has chronic kidney disease and was seen by a kidney specialist at [facility] in July. GFR was 38, vitamin D  level 16.6, K+ 5.4 and had 1.4 gram protein in urine. she was started on a weekly vitamin D  supplement.   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   Pharyngitis 12/06/2022   Neuropathy 12/06/2022   Tachycardia 12/05/2022   Supratherapeutic INR 12/05/2022   Hyperkalemia 12/05/2022   Hypotension due to hypovolemia 07/19/2022   Prolonged QT interval 07/19/2022   Marijuana user 12/24/2020   LFT elevation    Acquired pyloric stenosis    GI bleed 11/24/2020   Alcoholic peripheral neuropathy 09/01/2020   Thiamin deficiency 09/01/2020   Paresthesia    Vitamin B12 deficiency 05/04/2020   Vomiting 04/24/2020   GERD (gastroesophageal reflux disease)    Marijuana abuse    Macrocytic anemia 04/04/2019   Intermittent complete heart block (HCC)    Syncope 03/28/2019   Orthostatic hypotension 01/11/2017   Elevated liver enzymes 01/11/2017   Atypical chest pain 06/05/2016   Acute renal failure superimposed on stage 3a chronic kidney disease (HCC) 01/10/2016   Elevated INR 01/10/2016   TIA (transient ischemic attack) 09/13/2015   CVA (cerebral vascular accident) (HCC) 09/12/2015   SOB (shortness of breath) 02/24/2014   First degree heart block 06/17/2013   (HFpEF)  heart failure with preserved ejection fraction (HCC) 06/16/2013   History of bacterial endocarditis 06/16/2013   Warfarin anticoagulation 06/10/2013   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   Femoral hernia 05/23/2013   Mitral valve insufficiency 04/22/2013   Tricuspid regurgitation 04/22/2013    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   SVT (supraventricular tachycardia) (HCC) 03/16/2013     Current Outpatient Medications on File Prior to Visit  Medication Sig Dispense Refill   acetaminophen  (TYLENOL ) 500 MG tablet Take 1,000 mg by mouth in the morning, at noon, and at bedtime.     carvedilol  (COREG ) 3.125 MG tablet Take 1 tablet (3.125 mg total) by mouth 2 (two) times daily. 180 tablet 3   folic acid  (FOLVITE ) 1 MG tablet Take 1 tablet (1 mg total) by mouth daily. Please keep upcoming appointment for more refills. 30 tablet 1   gabapentin  (NEURONTIN ) 300 MG capsule Take 1 capsule (300 mg total) by mouth 3 (three) times daily. 270 capsule 2   latanoprost  (XALATAN ) 0.005 % ophthalmic solution Place 1 drop into both eyes at bedtime.     pantoprazole  (PROTONIX ) 40 MG tablet Take 1 tablet (40 mg total) by mouth 2 (two) times daily before a meal. 180 tablet 0   thiamine  (VITAMIN B1) 100 MG tablet Take 1 tablet (100 mg total) by mouth daily. 30 tablet 5   traMADol  (ULTRAM ) 50 MG tablet Take 1 tablet (50 mg total) by mouth at bedtime as needed. 10 tablet 0   warfarin (COUMADIN ) 5 MG tablet Take 1-1.5 tablets (5-7.5 mg total) by mouth daily. NEEDS INR CHECK 20 tablet 0   pantoprazole  (PROTONIX ) 40 MG tablet Take 1 tablet (40 mg total) by mouth 2 (two) times daily before a meal. (Patient not taking: Reported on 06/20/2024) 180 tablet 0   No current facility-administered medications on file prior to visit.    Allergies  Allergen Reactions   Ibuprofen  Itching   Oxycodone  Itching    Pt states no longer makes her itchy   Tape Itching, Rash and Other (See Comments)    THE ONLY TAPE THAT IS TOLERATED IS PAPER TAPE. EKG leads inflame the skin    Social History   Socioeconomic History   Marital status: Single    Spouse name: Not on file   Number of children: Not on file   Years of education: Not on file   Highest education level: Not on file  Occupational History    Occupation: unemployed  Tobacco Use   Smoking status: Former    Current packs/day: 0.00    Average packs/day: 0.5 packs/day for 46.0 years (23.0 ttl pk-yrs)    Types: Cigarettes    Start date: 40    Quit date: 2022    Years since quitting: 3.7   Smokeless tobacco: Never  Vaping Use   Vaping status: Never Used  Substance and Sexual Activity   Alcohol use: Not Currently    Comment:  glass of wine   Drug use: Yes    Types: Marijuana    Comment: chews on it periodically   Sexual activity: Never  Other Topics Concern   Not on file  Social History Narrative   Right handed   Two story home apartment    Social Drivers of Health   Financial Resource Strain: High Risk (11/24/2023)   Overall Financial Resource Strain (CARDIA)    Difficulty of Paying Living Expenses: Very hard  Food Insecurity: No Food Insecurity (02/26/2024)   Hunger Vital Sign  Worried About Programme researcher, broadcasting/film/video in the Last Year: Never true    Ran Out of Food in the Last Year: Never true  Transportation Needs: No Transportation Needs (02/26/2024)   PRAPARE - Administrator, Civil Service (Medical): No    Lack of Transportation (Non-Medical): No  Physical Activity: Not on file  Stress: Not on file  Social Connections: Not on file  Intimate Partner Violence: Not At Risk (02/26/2024)   Humiliation, Afraid, Rape, and Kick questionnaire    Fear of Current or Ex-Partner: No    Emotionally Abused: No    Physically Abused: No    Sexually Abused: No    Family History  Problem Relation Age of Onset   Kidney disease Mother        Had one Kidney removed   Stroke Father    Cancer Father    Sarcoidosis Sister    Heart attack Neg Hx    Colon cancer Neg Hx    Stomach cancer Neg Hx    Pancreatic cancer Neg Hx    Esophageal cancer Neg Hx     Past Surgical History:  Procedure Laterality Date   APPENDECTOMY  ~1978   BALLOON DILATION N/A 11/27/2020   Procedure: BALLOON DILATION;  Surgeon: Avram Lupita BRAVO,  MD;  Location: [facility] ENDOSCOPY;  Service: Endoscopy;  Laterality: N/A;   BIOPSY  05/01/2020   Procedure: BIOPSY;  Surgeon: Teressa Toribio SQUIBB, MD;  Location: [facility] ENDOSCOPY;  Service: Endoscopy;;   CARDIAC CATHETERIZATION     CARDIAC VALVE REPLACEMENT     CESAREAN SECTION  1983   CESAREAN SECTION WITH BILATERAL TUBAL LIGATION  1999   COLONOSCOPY WITH PROPOFOL  N/A 05/01/2020   Procedure: COLONOSCOPY WITH PROPOFOL ;  Surgeon: Teressa Toribio SQUIBB, MD;  Location: [facility] ENDOSCOPY;  Service: Endoscopy;  Laterality: N/A;   ESOPHAGOGASTRODUODENOSCOPY (EGD) WITH PROPOFOL  N/A 05/01/2020   Procedure: ESOPHAGOGASTRODUODENOSCOPY (EGD) WITH PROPOFOL ;  Surgeon: Teressa Toribio SQUIBB, MD;  Location: [facility] ENDOSCOPY;  Service: Endoscopy;  Laterality: N/A;   ESOPHAGOGASTRODUODENOSCOPY (EGD) WITH PROPOFOL  N/A 11/27/2020   Procedure: ESOPHAGOGASTRODUODENOSCOPY (EGD) WITH PROPOFOL ;  Surgeon: Avram Lupita BRAVO, MD;  Location: [facility] ENDOSCOPY;  Service: Endoscopy;  Laterality: N/A;   FEMORAL HERNIA REPAIR Right 05/23/2013   Procedure: HERNIA REPAIR FEMORAL;  Surgeon: Sudie VEAR Laine, MD;  Location[facility] OR;  Service: Open Heart Surgery;  Laterality: Right;   INTRAOPERATIVE TRANSESOPHAGEAL ECHOCARDIOGRAM N/A 05/23/2013   Procedure: INTRAOPERATIVE TRANSESOPHAGEAL ECHOCARDIOGRAM;  Surgeon: Sudie VEAR Laine, MD;  Location: [facility] OR;  Service: Open Heart Surgery;  Laterality: N/A;   LAPAROSCOPIC CHOLECYSTECTOMY  2003   LEFT AND RIGHT HEART CATHETERIZATION WITH CORONARY ANGIOGRAM N/A 03/22/2013   Procedure: LEFT AND RIGHT HEART CATHETERIZATION WITH CORONARY ANGIOGRAM;  Surgeon: Lonni JONETTA Cash, MD;  Location: [facility] CATH LAB;  Service: Cardiovascular;  Laterality: N/A;   MINIMALLY INVASIVE TRICUSPID VALVE REPAIR Right 05/23/2013   Procedure: MINIMALLY INVASIVE TRICUSPID VALVE REPAIR;  Surgeon: Sudie VEAR Laine, MD;  Location: [facility] OR;  Service: Open Heart Surgery;  Laterality: Right;   MITRAL VALVE REPLACEMENT N/A 05/23/2013   Procedure: MITRAL VALVE (MV)  REPLACEMENT;  Surgeon: Sudie VEAR Laine, MD;  Location: [facility] OR;  Service: Open Heart Surgery;  Laterality: N/A;   MULTIPLE EXTRACTIONS WITH ALVEOLOPLASTY N/A 04/04/2013   Procedure: Extraction of tooth #'s 1,8,9,13,14,15,23,24,25,26 with alveoloplasty and gross debridement of remaining teeth;  Surgeon: Tanda JULIANNA Fanny, DDS;  Location: [facility] OR;  Service: Oral Surgery;  Laterality: N/A;   TEE WITHOUT CARDIOVERSION N/A 03/18/2013  Procedure: TRANSESOPHAGEAL ECHOCARDIOGRAM (TEE);  Surgeon: Ezra GORMAN Shuck, MD;  Location: [facility] ENDOSCOPY;  Service: Cardiovascular;  Laterality: N/A;   TEE WITHOUT CARDIOVERSION N/A 06/17/2013   Procedure: TRANSESOPHAGEAL ECHOCARDIOGRAM (TEE);  Surgeon: Redell GORMAN Shallow, MD;  Location: [facility] ENDOSCOPY;  Service: Cardiovascular;  Laterality: N/A;   TUBAL LIGATION  1999    ROS: Review of Systems Negative except as stated above  PHYSICAL EXAM: BP 121/84 (BP Location: Left Arm, Patient Position: Sitting, Cuff Size: Normal)   Pulse (!) 104   Temp 98.2 F (36.8 C) (Oral)   Ht 5' 2 (1.575 m)   Wt 146 lb (66.2 kg)   LMP 01/28/2013   SpO2 98%   BMI 26.70 kg/m   Physical Exam Repeat BP 164/84 General appearance - alert, well appearing, and in no distress Mental status - normal mood, behavior, speech, dress, motor activity, and thought processes Chest - clear to auscultation, no wheezes, rales or rhonchi, symmetric air entry Heart -regular rate and rhythm with clicking heard of the mechanical valve Neurological -power lower extremities 4/5 proximally and distally.  She has some spasticity in the legs.  Decreased sensation grossly in both lower legs.  She ambulates with a cane.  Gait appears spastic/wobbly with a low to floor clearance Extremities - no LE edema      Latest Ref Rng & Units 11/21/2023    3:12 PM 07/17/2023   12:08 PM 01/25/2023    7:22 AM  CMP  Glucose 70 - 99 mg/dL 88  899  79   BUN 8 - 27 mg/dL 17  16  9    Creatinine 0.57 - 1.00 mg/dL 8.56  8.30  8.98    Sodium 134 - 144 mmol/L 145  141  138   Potassium 3.5 - 5.2 mmol/L 4.8  5.0  3.9   Chloride 96 - 106 mmol/L 108  104  112   CO2 20 - 29 mmol/L 21  19  22    Calcium  8.7 - 10.3 mg/dL 9.8  9.4  8.4   Total Protein 6.0 - 8.5 g/dL  7.1    Total Bilirubin 0.0 - 1.2 mg/dL  0.2    Alkaline Phos 44 - 121 IU/L  118    AST 0 - 40 IU/L  19    ALT 0 - 32 IU/L  9     Lipid Panel     Component Value Date/Time   CHOL 189 05/31/2018 1108   TRIG 300 (H) 05/31/2018 1108   HDL 68 05/31/2018 1108   CHOLHDL 2.8 05/31/2018 1108   CHOLHDL 3.2 06/06/2016 0135   VLDL 22 06/06/2016 0135   LDLCALC 61 05/31/2018 1108    CBC    Component Value Date/Time   WBC 8.6 11/21/2023 1512   WBC 6.7 01/25/2023 0722   RBC 3.59 (L) 11/21/2023 1512   RBC 2.97 (L) 01/25/2023 0722   HGB 11.2 11/21/2023 1512   HCT 34.8 11/21/2023 1512   PLT 353 11/21/2023 1512   MCV 97 11/21/2023 1512   MCH 31.2 11/21/2023 1512   MCH 33.0 01/25/2023 0722   MCHC 32.2 11/21/2023 1512   MCHC 31.3 01/25/2023 0722   RDW 13.1 11/21/2023 1512   LYMPHSABS 1.3 01/24/2023 1013   LYMPHSABS 1.9 06/26/2020 1218   MONOABS 0.6 01/24/2023 1013   EOSABS 0.2 01/24/2023 1013   EOSABS 0.2 06/26/2020 1218   BASOSABS 0.0 01/24/2023 1013   BASOSABS 0.0 06/26/2020 1218    ASSESSMENT AND PLAN: 1. Polyneuropathy (Primary) Due to combination of  alcohol induced peripheral neuropathy and B12 deficiency.  Will recheck B12 level today.  Total abstinence from alcohol. Think she is appropriate for and would benefit from a rollator walker.  Prescription will be sent to [facility]. - Vitamin B1 - For home use only DME 4 wheeled rolling walker with seat ([id number])  2. Gait disturbance See #1 above - For home use only DME 4 wheeled rolling walker with seat ([id number])  3. CKD stage 3b, GFR 30-44 ml/min (HCC) Seen by nephrology at [facility]. Plan to repeat BMP today and based on results we will decide about adding low-dose lisinopril versus Jardiance given  CKD with macroalbumin. - Basic Metabolic Panel  4. Positive for macroalbuminuria See # 3 above  5. Elevated blood pressure reading Blood pressure elevated today.  BP in the past has been normal.  DASH diet discussed and encouraged.  Plan to repeat BMP today and based on results we will decide about adding low-dose lisinopril versus Jardiance given CKD with macroalbumin. -she will be starting Coreg  once she turns in Zio patch after 2 wks  6. Diastolic CHF, chronic (HCC) Compensated.  Carvedilol  added by cardiology today the patient told to hold off on starting it until she has done 2 weeks of the Zio patch. Advised on low-salt diet.  7. Alcohol use disorder Strongly advised to avoid alcoholic beverages completely so as to prevent slipping back into daily consumption/abuse  8. Vitamin B12 deficiency Recheck B12 level today.  We will get back to her with results and instructions on whether to restart B12 supplement. - Vitamin B12 - CBC  9. VHD (valvular heart disease) Followed by cardiology at their anticoagulation clinic for INR monitoring.  10. Vitamin D  deficiency - Vitamin D , Ergocalciferol , (DRISDOL ) 1.25 MG (50000 UNIT) CAPS capsule; Take 1 capsule (50,000 Units total) by mouth once a week. For 12 weeks, then recheck level in the office  Dispense: 12 capsule; Refill: 0    Patient was given the opportunity to ask questions.  Patient verbalized understanding of the plan and was able to repeat key elements of the plan.   This documentation was completed using Paediatric nurse.  Any transcriptional errors are unintentional.  Orders Placed This Encounter  Procedures   For home use only DME 4 wheeled rolling walker with seat ([id number])   Vitamin B12   Vitamin B1   Basic Metabolic Panel   CBC     Requested Prescriptions   Signed Prescriptions Disp Refills   Vitamin D , Ergocalciferol , (DRISDOL ) 1.25 MG (50000 UNIT) CAPS capsule 12 capsule 0    Sig: Take 1  capsule (50,000 Units total) by mouth once a week. For 12 weeks, then recheck level in the office    Return in about 3 months (around 09/19/2024).  Barnie Louder, MD, FACP

## 2024-06-20 NOTE — Patient Instructions (Signed)
  VISIT SUMMARY: Today, you came in for a follow-up visit to manage your chronic conditions, including heart disease, kidney disease, and vitamin deficiencies. We discussed your current medications, recent lab results, and any new symptoms you are experiencing.  YOUR PLAN: -CHRONIC KIDNEY DISEASE: Chronic kidney disease means your kidneys are not working as well as they should. We will recheck your kidney function and potassium levels today. Depending on the results, we may start you on lisinopril or Jardiance. Please continue your weekly vitamin D  supplement and limit your salt intake.  -HYPERTENSION: Hypertension is high blood pressure, which can affect your kidney function. We will recheck your blood pressure today and may start you on lisinopril or Jardiance based on the results. Please limit your salt intake.  -PERIPHERAL NEUROPATHY: Peripheral neuropathy is nerve damage that causes numbness and tingling, often in your hands and feet. This is likely due to past alcohol use and vitamin deficiencies. We will check your B12 and thiamine  levels today. Continue taking thiamine  and gabapentin  as prescribed. We may restart B12 supplements based on your lab results. A rollator walker will be prescribed to help with your mobility.  -ALCOHOL USE DISORDER, IN REMISSION: Your alcohol use disorder is in remission, meaning you have significantly reduced your alcohol intake. Please continue to abstain from alcohol completely.  -CONGESTIVE HEART FAILURE: Congestive heart failure means your heart is not pumping blood as well as it should. You have a heart monitor in place for 14 days. After the monitor is removed, you will start taking carvedilol .  -VALVULAR HEART DISEASE WITH MECHANICAL VALVE: Valvular heart disease means there is a problem with one of your heart valves. You have a mechanical valve and are on Coumadin  to prevent blood clots. Continue your Coumadin  therapy and regular follow-ups at the Coumadin   clinic.  -VITAMIN D  DEFICIENCY: Vitamin D  deficiency means you have low levels of vitamin D . Continue taking your weekly vitamin D  supplement. We will send a refill for your vitamin D .  INSTRUCTIONS: Please follow up with the nephrologist as needed and continue attending the Coumadin  clinic for anticoagulation management. We will contact you with your lab results and any changes to your treatment plan.                      Contains text generated by Abridge.                                 Contains text generated by Abridge.

## 2024-06-20 NOTE — Telephone Encounter (Signed)
 I met with the patient when she was in the clinic today.  She has not heard back from [facility] but she just left the message yesterday.  I told her that I will check with [facility] to confirm that he has received the message.    I spoke to Schuyler Allis, MSW/ [facility] [employer] MLP: [phone] and she was able to confirm that Norleen Sar has the patient's case and has received her message.   I shared this information with the patient and instructed her to call me next week if she has not heard from Mr Sar and/ or if she has any questions and she said she understood

## 2024-06-20 NOTE — Telephone Encounter (Signed)
 Copied from CRM [id number]. Topic: General - Running Late >> Jun 20, 2024  8:51 AM Gustabo D wrote:  Patient/patient representative is calling because they are running late for an appointment.

## 2024-06-20 NOTE — Telephone Encounter (Signed)
 FYI

## 2024-06-20 NOTE — Patient Instructions (Signed)
 Medication Instructions:  Start Carvedilol  3.125 MG take one tablet twice daily *If you need a refill on your cardiac medications before your next appointment, please call your pharmacy*  Testing/Procedures: ZIO XT- Long Term Monitor Instructions  Your physician has requested you wear a ZIO patch monitor for 14 days.  This is a single patch monitor. [employer] supplies one patch monitor per enrollment. Additional stickers are not available. Please do not apply patch if you will be having a Nuclear Stress Test,  Echocardiogram, Cardiac CT, MRI, or Chest Xray during the period you would be wearing the  monitor. The patch cannot be worn during these tests. You cannot remove and re-apply the  ZIO XT patch monitor.   Billing and Patient Assistance Program Information  We have supplied [employer] with any of your insurance information on file for billing purposes. [employer] offers a sliding scale Patient Assistance Program for patients that do not have  insurance, or whose insurance does not completely cover the cost of the ZIO monitor.  You must apply for the Patient Assistance Program to qualify for this discounted rate.  To apply, please call [employer] at [phone], select option 4, select option 2, ask to apply for  Patient Assistance Program. Meredeth will ask your household income, and how many people  are in your household. They will quote your out-of-pocket cost based on that information.  [employer] will also be able to set up a 17-month, interest-free payment plan if needed.  When you are ready to remove the patch, follow instructions on the last 2 pages of Patient  Logbook. Stick patch monitor onto the last page of Patient Logbook.  Place Patient Logbook in the blue and white box. Use locking tab on box and tape box closed  securely. The blue and white box has prepaid postage on it. Please place it in the mailbox as  soon as possible. Your physician should have your test results approximately  7 days after the  monitor has been mailed back to [facility].  Call [facility] Customer Care at [phone] if you have questions regarding  your ZIO XT patch monitor. Call them immediately if you see an orange light blinking on your  monitor.  If your monitor falls off in less than 4 days, contact our Monitor department at [phone].  If your monitor becomes loose or falls off after 4 days call [employer] at [phone] for  suggestions on securing your monitor   Follow-Up: At [facility], you and your health needs are our priority.  As part of our continuing mission to provide you with exceptional heart care, our providers are all part of one team.  This team includes your primary Cardiologist (physician) and Advanced Practice Providers or APPs (Physician Assistants and Nurse Practitioners) who all work together to provide you with the care you need, when you need it.  Your next appointment:   6 month(s)  Provider:   Oneil Parchment, MD or Orren Fabry, PA-C          We recommend signing up for the patient portal called MyChart.  Sign up information is provided on this After Visit Summary.  MyChart is used to connect with patients for Virtual Visits (Telemedicine).  Patients are able to view lab/test results, encounter notes, upcoming appointments, etc.  Non-urgent messages can be sent to your provider as well.   To learn more about what you can do with MyChart, go to [email or web address].   Other Instructions Please keep a log of your  blood pressures and bring them to your next appointment.

## 2024-06-20 NOTE — Progress Notes (Unsigned)
 Applied a 14 day Zio XT monitor to patient in the office  Skains to read

## 2024-06-20 NOTE — Telephone Encounter (Signed)
 Noted. Patient seen today for scheduled appointment.

## 2024-06-23 ENCOUNTER — Ambulatory Visit: Payer: Self-pay | Admitting: Internal Medicine

## 2024-06-23 NOTE — Progress Notes (Signed)
 Kidney function not 100% but stable. Potassium level in high normal range. Blood cell count stable. Vitamin B12 level is within normal range.

## 2024-06-24 LAB — BASIC METABOLIC PANEL WITH GFR
BUN/Creatinine Ratio: 13 (ref 12–28)
BUN: 21 mg/dL (ref 8–27)
CO2: 18 mmol/L — ABNORMAL LOW (ref 20–29)
Calcium: 9.9 mg/dL (ref 8.7–10.3)
Chloride: 108 mmol/L — ABNORMAL HIGH (ref 96–106)
Creatinine, Ser: 1.61 mg/dL — ABNORMAL HIGH (ref 0.57–1.00)
Glucose: 94 mg/dL (ref 70–99)
Potassium: 5.1 mmol/L (ref 3.5–5.2)
Sodium: 144 mmol/L (ref 134–144)
eGFR: 36 mL/min/1.73 — ABNORMAL LOW (ref >59)

## 2024-06-24 LAB — CBC
Hematocrit: 37.5 % (ref 34.0–46.6)
Hemoglobin: 11.9 g/dL (ref 11.1–15.9)
MCH: 32.8 pg (ref 26.6–33.0)
MCHC: 31.7 g/dL (ref 31.5–35.7)
MCV: 103 fL — ABNORMAL HIGH (ref 79–97)
Platelets: 395 x10E3/uL (ref 150–450)
RBC: 3.63 x10E6/uL — ABNORMAL LOW (ref 3.77–5.28)
RDW: 13.5 % (ref 11.7–15.4)
WBC: 9.4 x10E3/uL (ref 3.4–10.8)

## 2024-06-24 LAB — VITAMIN B12: Vitamin B-12: 643 pg/mL (ref 232–1245)

## 2024-06-24 LAB — VITAMIN B1: Thiamine: 201.1 nmol/L — ABNORMAL HIGH (ref 66.5–200.0)

## 2024-06-25 ENCOUNTER — Ambulatory Visit

## 2024-06-27 NOTE — Telephone Encounter (Signed)
 I called the patient to inquire if she heard from [facility] and she said she did not. I told her that I would call [facility] to follow up.  I left a message for Schuyler Allis, [facility], requesting a call back.  I then spoke to [place]: [phone], attorney/[facility] to inquire about the status of the referral because the patient has not heard from Mr. Janit yet.  Saddie said she will reach out to Mr. Janit about this patient.

## 2024-06-28 ENCOUNTER — Other Ambulatory Visit

## 2024-06-28 NOTE — Telephone Encounter (Signed)
 Please Advise

## 2024-06-28 NOTE — Telephone Encounter (Signed)
 Copied from CRM [id number]. Topic: Clinical - Medication Question >> Jun 28, 2024  8:38 AM Selinda RAMAN wrote:  Reason for CRM: The patient called back in stating no one returned her call in response to a high priority message that was sent on 10/01. This is concerning was MG she is supposed to take of her B12. I spoke with Zara and she said there is no one she can talk to today that can give that answer. She said the provider is out and Parris would not be available either until next week. Please assist patient as soon as possible.

## 2024-06-28 NOTE — Patient Outreach (Signed)
 Complex Care Management   Visit Note  06/28/2024  Name:  Mary [name] MRN: [id number] DOB: 1961-07-24  Situation: Referral received for Complex Care Management related to high risk BH patient I obtained verbal consent from Patient.  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    Protein calorie malnutrition    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   33mm Sorin Carbomedics mechanical prosthesis via right mini thoracotomy approach   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   Complex valvuloplasty including Cor-matrix ECM patch augmentation of anterior and lateral leaflets with 26mm Edwards mc3 ring annuloplasty via right mini-thoracotomy approach   Sinus tachycardia    SVT (supraventricular tachycardia) 03/16/2013   TIA (transient ischemic attack)     Assessment: Patient Reported Symptoms:  Cognitive Cognitive Status: Able to follow simple commands, Alert and oriented to person, place, and time, Normal speech and language skills Cognitive/Intellectual Conditions Management [RPT]: None reported or documented in medical history or problem list      Neurological Neurological Review of Symptoms: No symptoms reported    HEENT HEENT Symptoms Reported: No symptoms reported      Cardiovascular Cardiovascular Symptoms Reported:  Palpitations Does patient have uncontrolled Hypertension?: No Cardiovascular Management Strategies: Weight management, Medication therapy, Routine screening Do You Have a Working Readable Scale?: Yes Weight: 146 lb (66.2 kg) (Patient reported) Cardiovascular Comment: Patient is currently wearing a 14-day heart monitor placed by cardiology 06/20/24 for palpitations. Patient reports palpitations vary, but she has not had any today.  Respiratory Respiratory Symptoms Reported: No symptoms reported    Endocrine Endocrine Symptoms Reported: No symptoms reported Is patient diabetic?: No    Gastrointestinal Gastrointestinal Symptoms Reported: No symptoms reported      Genitourinary Genitourinary Symptoms Reported: No symptoms reported    Integumentary Integumentary Symptoms Reported: No symptoms reported    Musculoskeletal Musculoskelatal Symptoms Reviewed: Unsteady gait Musculoskeletal Management Strategies: Adequate rest, Medical device, Exercise Musculoskeletal Comment: Note per chart review an order for rollator was placed by PCP on 06/20/24. Patient reports she has not received yet. Denies falls since previous CMRN visit. Falls in the past year?: No Number of falls in past year: 1 or less Was there an injury with Fall?: No Fall Risk Category Calculator: 0 Patient Fall Risk Level: Low Fall Risk Patient at Risk for Falls Due to: Impaired balance/gait Fall risk Follow up: Falls evaluation completed, Education provided  Psychosocial Psychosocial Symptoms Reported: No symptoms reported          06/28/2024    PHQ2-9 Depression Screening   Little interest or pleasure in doing things    Feeling down, depressed, or hopeless    PHQ-2 - Total Score    Trouble falling or  staying asleep, or sleeping too much    Feeling tired or having little energy    Poor appetite or overeating     Feeling bad about yourself - or that you are a failure or have let yourself or your family down    Trouble  concentrating on things, such as reading the newspaper or watching television    Moving or speaking so slowly that other people could have noticed.  Or the opposite - being so fidgety or restless that you have been moving around a lot more than usual    Thoughts that you would be better off dead, or hurting yourself in some way    PHQ2-9 Total Score    If you checked off any problems, how difficult have these problems made it for you to do your work, take care of things at home, or get along with other people    Depression Interventions/Treatment      There were no vitals filed for this visit.  Medications Reviewed Today     Reviewed by Arno Rosaline SQUIBB, RN (Registered Nurse) on 06/28/24 at 1402  Med List Status: <None>   Medication Order Taking? Sig Documenting Provider Last Dose Status Informant  acetaminophen  (TYLENOL ) 500 MG tablet [id number]  Take 1,000 mg by mouth in the morning, at noon, and at bedtime. [provider]  Active Self  carvedilol  (COREG ) 3.125 MG tablet [id number]  Take 1 tablet (3.125 mg total) by mouth 2 (two) times daily. Lucien Orren SAILOR, PA-C  Active   folic acid  (FOLVITE ) 1 MG tablet [id number]  Take 1 tablet (1 mg total) by mouth daily. Please keep upcoming appointment for more refills. Vicci Barnie NOVAK, MD  Active   gabapentin  (NEURONTIN ) 300 MG capsule [id number]  Take 1 capsule (300 mg total) by mouth 3 (three) times daily. Vicci Barnie NOVAK, MD  Active   latanoprost  (XALATAN ) 0.005 % ophthalmic solution [id number]  Place 1 drop into both eyes at bedtime. Pavero, Lonni, [facility]  Active   pantoprazole  (PROTONIX ) 40 MG tablet [id number]  Take 1 tablet (40 mg total) by mouth 2 (two) times daily before a meal.  Patient not taking: Reported on 06/20/2024   Vicci Barnie NOVAK, MD  Active   pantoprazole  (PROTONIX ) 40 MG tablet [id number]  Take 1 tablet (40 mg total) by mouth 2 (two) times daily before a meal. Vicci Barnie NOVAK, MD  Active   thiamine  (VITAMIN  B1) 100 MG tablet [id number]  Take 1 tablet (100 mg total) by mouth daily. Jeffrie Oneil BROCKS, MD  Active   traMADol  (ULTRAM ) 50 MG tablet [id number]  Take 1 tablet (50 mg total) by mouth at bedtime as needed. Newlin, Enobong, MD  Active   Vitamin D , Ergocalciferol , (DRISDOL ) 1.25 MG (50000 UNIT) CAPS capsule [id number]  Take 1 capsule (50,000 Units total) by mouth once a week. For 12 weeks, then recheck level in the office Vicci Barnie NOVAK, MD  Active   warfarin (COUMADIN ) 5 MG tablet [id number]  Take 1-1.5 tablets (5-7.5 mg total) by mouth daily. NEEDS INR CHECK Jeffrie Oneil BROCKS, MD  Active             Recommendation:   Continue Current Plan of Care  Follow Up Plan:   Closing From:  Complex Care Management Patient has met all care management goals. Care Management case will be closed. Patient has been provided contact information should new needs arise.   Rosaline Arno, RN MSN [place]  [facility] Population Health RN Care  Insurance underwriter Dial: [phone]  Fax: [phone]

## 2024-06-28 NOTE — Patient Instructions (Signed)
 Visit Information  Ms. Treloar was given information about Medicaid Managed Care team care coordination services as a part of their [employer] Medicaid benefit.   If you would like to schedule transportation through your [facility] plan, please call the following number at least 2 days in advance of your appointment: [phone]  If you are experiencing a behavioral health crisis, call the [employer] [place]  Behavioral Health Crisis Line at [phone]). The line is available 24 hours a day, seven days a week.   The patient verbalized understanding of instructions, educational materials, and care plan provided today and DECLINED offer to receive copy of patient instructions, educational materials, and care plan.   No further follow up required: Patient has met care plan goals  Rosaline Finlay, RN MSN [place]  [facility] RN Care Manager Direct Dial: [phone]  Fax: [phone]   Following is a copy of your plan of care:  There are no care plans that you recently modified to display for this patient.

## 2024-06-28 NOTE — Progress Notes (Signed)
 Call to patient to advised per pcp continue the 500 mg of vitamin B12.

## 2024-07-01 ENCOUNTER — Ambulatory Visit: Attending: Cardiology

## 2024-07-01 DIAGNOSIS — Z7901 Long term (current) use of anticoagulants: Secondary | ICD-10-CM | POA: Insufficient documentation

## 2024-07-01 LAB — POCT INR: INR: 1.2 — AB (ref 2.0–3.0)

## 2024-07-01 NOTE — Progress Notes (Signed)
 INR 1.2 Please see anticoagulation encounter Take 2 tablets tonight and Tuesday only then  continue taking warfarin 1 tablet daily except for 1.5 tablets Sunday, Tuesday, and Thursday. Continue regular ensure intake (1 per week) or the leafy veggies.  Recheck INR 1 week. Coumadin  Clinic [phone]

## 2024-07-01 NOTE — Patient Instructions (Signed)
 Take 2 tablets tonight and Tuesday only then  continue taking warfarin 1 tablet daily except for 1.5 tablets Sunday, Tuesday, and Thursday. Continue regular ensure intake (1 per week) or the leafy veggies.  Recheck INR 1 week. Coumadin  Clinic [phone]

## 2024-07-05 ENCOUNTER — Other Ambulatory Visit: Payer: Self-pay | Admitting: Cardiology

## 2024-07-05 ENCOUNTER — Other Ambulatory Visit (HOSPITAL_COMMUNITY): Payer: Self-pay

## 2024-07-05 ENCOUNTER — Other Ambulatory Visit: Payer: Self-pay

## 2024-07-05 DIAGNOSIS — Z954 Presence of other heart-valve replacement: Secondary | ICD-10-CM

## 2024-07-05 MED ORDER — WARFARIN SODIUM 5 MG PO TABS
5.0000 mg | ORAL_TABLET | Freq: Every day | ORAL | 0 refills | Status: DC
  Filled 2024-07-05: qty 10, 6d supply, fill #0

## 2024-07-05 NOTE — Telephone Encounter (Signed)
 Pt is out of medication and her mail order is delayed.

## 2024-07-05 NOTE — Telephone Encounter (Signed)
 Warfarin 5mg  refill   Last INR 07/01/24 and appt pending 07/11/24 Last OV 06/20/24

## 2024-07-11 ENCOUNTER — Other Ambulatory Visit: Payer: Self-pay

## 2024-07-11 ENCOUNTER — Ambulatory Visit: Attending: Cardiology

## 2024-07-11 DIAGNOSIS — Z7901 Long term (current) use of anticoagulants: Secondary | ICD-10-CM | POA: Diagnosis not present

## 2024-07-11 DIAGNOSIS — Z954 Presence of other heart-valve replacement: Secondary | ICD-10-CM

## 2024-07-11 LAB — POCT INR: INR: 2.1 (ref 2.0–3.0)

## 2024-07-11 MED ORDER — WARFARIN SODIUM 5 MG PO TABS
5.0000 mg | ORAL_TABLET | Freq: Every day | ORAL | 0 refills | Status: DC
  Filled 2024-07-11: qty 30, 20d supply, fill #0

## 2024-07-11 NOTE — Progress Notes (Signed)
 INR 2.1 Please see anticoagulation encounter Take 2 tablets tonight  then  continue taking warfarin 1 tablet daily except for 1.5 tablets Sunday, Tuesday, and Thursday. Continue regular ensure intake (1 per week) or the leafy veggies.  Recheck INR 3 weeks. Coumadin  Clinic [phone]

## 2024-07-11 NOTE — Patient Instructions (Signed)
 Take 2 tablets tonight  then  continue taking warfarin 1 tablet daily except for 1.5 tablets Sunday, Tuesday, and Thursday. Continue regular ensure intake (1 per week) or the leafy veggies.  Recheck INR 3 weeks. Coumadin  Clinic [phone]

## 2024-07-12 ENCOUNTER — Telehealth: Payer: Self-pay | Admitting: Internal Medicine

## 2024-07-12 NOTE — Telephone Encounter (Signed)
 Can you update the letter last letter sent out 06/2023    Copied from CRM #[id number]. Topic: General - Other >> Jul 11, 2024 10:33 AM Rosaria BRAVO wrote: Reason for CRM: Pt called requesting the letter that her PCP wrote for her to help her get a first floor apartment. She is asking for this to be resent to the address on file.   She needs to submit this to [employer].   Please advise.

## 2024-07-15 ENCOUNTER — Other Ambulatory Visit (HOSPITAL_COMMUNITY): Payer: Self-pay

## 2024-07-18 NOTE — Telephone Encounter (Signed)
 Request sent to PCP for updated letter .

## 2024-07-19 ENCOUNTER — Telehealth: Payer: Self-pay | Admitting: Internal Medicine

## 2024-07-19 NOTE — Telephone Encounter (Signed)
 Updated letter that you requested for this pt for [employer] is ready.

## 2024-07-19 NOTE — Telephone Encounter (Signed)
 noted

## 2024-07-19 NOTE — Telephone Encounter (Signed)
 Letter received and mailed to patient per her request.

## 2024-07-20 DIAGNOSIS — R002 Palpitations: Secondary | ICD-10-CM | POA: Diagnosis not present

## 2024-07-25 ENCOUNTER — Ambulatory Visit: Payer: Self-pay | Admitting: Physician Assistant

## 2024-07-29 ENCOUNTER — Other Ambulatory Visit (HOSPITAL_COMMUNITY): Payer: Self-pay

## 2024-07-29 ENCOUNTER — Other Ambulatory Visit: Payer: Self-pay

## 2024-07-29 DIAGNOSIS — H462 Nutritional optic neuropathy: Secondary | ICD-10-CM | POA: Diagnosis not present

## 2024-07-29 DIAGNOSIS — H40013 Open angle with borderline findings, low risk, bilateral: Secondary | ICD-10-CM | POA: Diagnosis not present

## 2024-07-29 DIAGNOSIS — H25813 Combined forms of age-related cataract, bilateral: Secondary | ICD-10-CM | POA: Diagnosis not present

## 2024-07-29 MED ORDER — LATANOPROST 0.005 % OP SOLN
1.0000 [drp] | Freq: Every evening | OPHTHALMIC | 3 refills | Status: AC
  Filled 2024-07-29: qty 2.5, 25d supply, fill #0

## 2024-08-01 ENCOUNTER — Ambulatory Visit: Attending: Cardiology | Admitting: *Deleted

## 2024-08-01 DIAGNOSIS — Z7901 Long term (current) use of anticoagulants: Secondary | ICD-10-CM | POA: Diagnosis not present

## 2024-08-01 DIAGNOSIS — Z5181 Encounter for therapeutic drug level monitoring: Secondary | ICD-10-CM | POA: Insufficient documentation

## 2024-08-01 DIAGNOSIS — Z954 Presence of other heart-valve replacement: Secondary | ICD-10-CM | POA: Insufficient documentation

## 2024-08-01 LAB — POCT INR: POC INR: 2.7

## 2024-08-01 NOTE — Patient Instructions (Addendum)
 Description   INR 2.7, Continue taking warfarin 1 tablet daily except for 1.5 tablets Sunday, Tuesday, and Thursday. Continue regular ensure intake (1 per week) or the leafy veggies.  Recheck INR 5 weeks. Coumadin  Clinic [phone]

## 2024-08-01 NOTE — Progress Notes (Signed)
 Lab Results  Component Value Date   INR 2.7 08/01/2024   INR 2.1 07/11/2024   INR 1.2 (A) 07/01/2024    Description   INR 2.7, Continue taking warfarin 1 tablet daily except for 1.5 tablets Sunday, Tuesday, and Thursday. Continue regular ensure intake (1 per week) or the leafy veggies.  Recheck INR 5 weeks. Coumadin  Clinic [phone]

## 2024-08-13 ENCOUNTER — Other Ambulatory Visit: Payer: Self-pay | Admitting: Cardiology

## 2024-08-13 ENCOUNTER — Other Ambulatory Visit: Payer: Self-pay

## 2024-08-13 ENCOUNTER — Other Ambulatory Visit (HOSPITAL_COMMUNITY): Payer: Self-pay

## 2024-08-13 ENCOUNTER — Other Ambulatory Visit: Payer: Self-pay | Admitting: Internal Medicine

## 2024-08-13 DIAGNOSIS — Z954 Presence of other heart-valve replacement: Secondary | ICD-10-CM

## 2024-08-13 MED ORDER — WARFARIN SODIUM 5 MG PO TABS
5.0000 mg | ORAL_TABLET | Freq: Every day | ORAL | 0 refills | Status: DC
  Filled 2024-08-13: qty 30, 20d supply, fill #0

## 2024-08-14 ENCOUNTER — Other Ambulatory Visit (HOSPITAL_COMMUNITY): Payer: Self-pay

## 2024-08-14 ENCOUNTER — Other Ambulatory Visit: Payer: Self-pay

## 2024-08-14 MED ORDER — FOLIC ACID 1 MG PO TABS
1.0000 mg | ORAL_TABLET | Freq: Every day | ORAL | 0 refills | Status: DC
  Filled 2024-08-14: qty 90, 90d supply, fill #0

## 2024-08-20 ENCOUNTER — Telehealth: Payer: Self-pay | Admitting: Internal Medicine

## 2024-08-20 NOTE — Telephone Encounter (Signed)
 Copied from CRM #[id number]. Topic: Clinical - Order For Equipment >> Aug 20, 2024 11:05 AM Zebedee SAUNDERS wrote:  Reason for CRM: Pt needs script for rollator walker discussed on 06/20/2024 with Dr. Vicci.  >> Aug 20, 2024 11:09 AM Zebedee SAUNDERS wrote:  Rollator walker with seat.

## 2024-08-20 NOTE — Telephone Encounter (Signed)
 Copied from CRM #[id number]. Topic: Referral - Request for Referral >> Aug 20, 2024 11:02 AM Zebedee SAUNDERS wrote:  Did the patient discuss referral with their provider in the last year? Yes (If No - schedule appointment) (If Yes - send message)  Appointment offered? Yes  Type of order/referral and detailed reason for visit: [place] and neuropathy  Preference of office, provider, location: Pt wants therapy out of [place]  If referral order, have you been seen by this specialty before? No (If Yes, this issue or another issue? When? Where?  Can we respond through MyChart? No

## 2024-08-20 NOTE — Telephone Encounter (Signed)
 Order was originally submitted through [employer] due to [employer] not accepting her insurance. I have found a new DME company hopefully they will cover her walker.

## 2024-08-20 NOTE — Telephone Encounter (Signed)
 Copied from CRM [id number]. Topic: General - Other >> Aug 20, 2024 11:09 AM Zebedee SAUNDERS wrote:  Reason for CRM: Pt is trying to get in touch with social worker Ms. Slater to contact pt at  [phone].

## 2024-08-20 NOTE — Telephone Encounter (Signed)
 Called & spoke to the patient. Verified name & DOB. Verified that patient is requesting physical therapy for the stroke that she had and neuropathy as well. Patient clarified that she would like a referral to physical therapy at a facility and not in-home PT if possible. Confirmed with patient that she would like for it to be in [facility]. Please advise.

## 2024-08-20 NOTE — Telephone Encounter (Signed)
 Called & spoke to the patient. Verified name & DOB. Informed that the original order was submitted through [employer] as [employer] does not accept her insurance. A new order will be submitted through another company. Patient expressed verbal understanding of all discussed.

## 2024-08-20 NOTE — Telephone Encounter (Signed)
 This was not discussed on last visit. Will need visit to discuss.

## 2024-08-21 NOTE — Telephone Encounter (Signed)
 Call returned to patient.. She explained that she is trying to get in touch with Norleen, an attorney with [employer] [place].  She stated she reached out to him a couple of days ago and has not heard back.  I told her that I will contact [employer] Aid for her and request assistance.    Email sent to [employer], [employer] [place] Attorney requesting that John contact the patient

## 2024-08-21 NOTE — Telephone Encounter (Signed)
 Noted! Thank you

## 2024-08-21 NOTE — Telephone Encounter (Signed)
 Contacted patient. Patient scheduled for 12/16 at 9:10 AM for in-person appointment.

## 2024-08-28 NOTE — Telephone Encounter (Signed)
 I called the patient and she said she still has not heard from [facility].  I told her that I would contact [facility] again.  Email sent to Ashyra Corpening, Attorney/ [facility] informing her that the patient has not heard from Norleen Sar yet and is anxious to speak with him.

## 2024-09-05 ENCOUNTER — Ambulatory Visit: Attending: Cardiology

## 2024-09-05 DIAGNOSIS — Z7901 Long term (current) use of anticoagulants: Secondary | ICD-10-CM | POA: Diagnosis present

## 2024-09-05 LAB — POCT INR: INR: 5.9 — AB (ref 2.0–3.0)

## 2024-09-05 NOTE — Progress Notes (Signed)
 INR 5.9 Please see anticoagulation encounter Hold today and tomorrow then Continue taking warfarin 1 tablet daily except for 1.5 tablets Sunday, Tuesday, and Thursday. Continue regular ensure intake (1 per week) or the leafy veggies.  Recheck INR 2 weeks. Coumadin  Clinic [phone]

## 2024-09-05 NOTE — Patient Instructions (Signed)
 Hold today and tomorrow then Continue taking warfarin 1 tablet daily except for 1.5 tablets Sunday, Tuesday, and Thursday. Continue regular ensure intake (1 per week) or the leafy veggies.  Recheck INR 2 weeks. Coumadin  Clinic [phone]

## 2024-09-10 ENCOUNTER — Ambulatory Visit: Admitting: Internal Medicine

## 2024-09-11 ENCOUNTER — Other Ambulatory Visit: Payer: Self-pay

## 2024-09-11 ENCOUNTER — Other Ambulatory Visit (HOSPITAL_COMMUNITY): Payer: Self-pay

## 2024-09-11 ENCOUNTER — Other Ambulatory Visit: Payer: Self-pay | Admitting: Cardiology

## 2024-09-11 DIAGNOSIS — Z954 Presence of other heart-valve replacement: Secondary | ICD-10-CM

## 2024-09-11 MED ORDER — WARFARIN SODIUM 5 MG PO TABS
5.0000 mg | ORAL_TABLET | Freq: Every day | ORAL | 1 refills | Status: DC
  Filled 2024-09-11: qty 30, 20d supply, fill #0

## 2024-09-11 NOTE — Telephone Encounter (Signed)
 Refill request for warfarin:  Last INR was 5.9 on 09/05/24 Next INR due 09/20/24 LOV was 06/20/24  Refill approved.

## 2024-09-12 NOTE — Telephone Encounter (Signed)
 I called the patient and she said she has been [facility] and he continues to assist her with her housing situation. She said she is still gathering 2 documents for him.  She stated she needs another copy of the letter Dr Vicci wrote recommending a first floor apartment for her.  I confirmed her addressed and mailed her the letter as she requested. She said she did not need anything else at this time

## 2024-09-18 ENCOUNTER — Other Ambulatory Visit (HOSPITAL_COMMUNITY): Payer: Self-pay

## 2024-09-18 ENCOUNTER — Other Ambulatory Visit: Payer: Self-pay | Admitting: Internal Medicine

## 2024-09-18 DIAGNOSIS — D539 Nutritional anemia, unspecified: Secondary | ICD-10-CM

## 2024-09-18 DIAGNOSIS — E538 Deficiency of other specified B group vitamins: Secondary | ICD-10-CM

## 2024-09-20 ENCOUNTER — Ambulatory Visit

## 2024-09-24 ENCOUNTER — Other Ambulatory Visit: Payer: Self-pay

## 2024-09-30 ENCOUNTER — Ambulatory Visit: Attending: Cardiology | Admitting: Pharmacist

## 2024-09-30 DIAGNOSIS — Z7901 Long term (current) use of anticoagulants: Secondary | ICD-10-CM | POA: Insufficient documentation

## 2024-09-30 DIAGNOSIS — Z954 Presence of other heart-valve replacement: Secondary | ICD-10-CM | POA: Insufficient documentation

## 2024-09-30 LAB — POCT INR: INR: 1.8 — AB (ref 2.0–3.0)

## 2024-09-30 NOTE — Patient Instructions (Signed)
 Description   INR 1.8 Take 2 tablets today and then continue taking warfarin 1 tablet daily except for 1.5 tablets Sunday, Tuesday, and Thursday. Continue regular ensure intake (1 per week) or the leafy veggies.  Recheck INR 2 weeks. Coumadin  Clinic [phone]

## 2024-09-30 NOTE — Progress Notes (Signed)
 Description   INR 1.8 Take 2 tablets today and then continue taking warfarin 1 tablet daily except for 1.5 tablets Sunday, Tuesday, and Thursday. Continue regular ensure intake (1 per week) or the leafy veggies.  Recheck INR 2 weeks. Coumadin  Clinic [phone]

## 2024-10-01 ENCOUNTER — Encounter: Payer: Self-pay | Admitting: Internal Medicine

## 2024-10-01 ENCOUNTER — Ambulatory Visit: Attending: Internal Medicine | Admitting: Internal Medicine

## 2024-10-01 VITALS — BP 99/72 | HR 111 | Temp 98.4°F | Ht 62.0 in | Wt 148.0 lb

## 2024-10-01 DIAGNOSIS — R269 Unspecified abnormalities of gait and mobility: Secondary | ICD-10-CM | POA: Diagnosis not present

## 2024-10-01 DIAGNOSIS — G621 Alcoholic polyneuropathy: Secondary | ICD-10-CM | POA: Diagnosis not present

## 2024-10-01 DIAGNOSIS — E519 Thiamine deficiency, unspecified: Secondary | ICD-10-CM | POA: Diagnosis not present

## 2024-10-01 DIAGNOSIS — M25562 Pain in left knee: Secondary | ICD-10-CM | POA: Diagnosis not present

## 2024-10-01 DIAGNOSIS — N1832 Chronic kidney disease, stage 3b: Secondary | ICD-10-CM | POA: Diagnosis not present

## 2024-10-01 DIAGNOSIS — E559 Vitamin D deficiency, unspecified: Secondary | ICD-10-CM

## 2024-10-01 DIAGNOSIS — I499 Cardiac arrhythmia, unspecified: Secondary | ICD-10-CM | POA: Diagnosis not present

## 2024-10-01 DIAGNOSIS — I5032 Chronic diastolic (congestive) heart failure: Secondary | ICD-10-CM | POA: Diagnosis not present

## 2024-10-01 DIAGNOSIS — M25561 Pain in right knee: Secondary | ICD-10-CM

## 2024-10-01 MED ORDER — FOLIC ACID 1 MG PO TABS: 1.0000 mg | ORAL_TABLET | Freq: Every day | ORAL | 0 refills | Status: AC

## 2024-10-01 NOTE — Patient Instructions (Addendum)
" °  VISIT SUMMARY: Mary [name], a 64 year old female with a history of congestive heart failure and valvular heart disease, came in for a follow-up visit. She reported issues with her current medications, episodes of palpitations, and near fainting. She also mentioned chronic shortness of breath, knee swelling, and balance issues. Additionally, she has a history of vitamin deficiencies and kidney problems.  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 You also have a prosthetic heart valve and irregular heartbeats. We are holding off on starting carvedilol  due to your low blood pressure. Please recheck your blood pressure in two weeks, and we will consider starting carvedilol  if it improves. You can use Tylenol  for knee pain if needed.  -CHRONIC KIDNEY DISEASE WITH PROTEINURIA: Chronic kidney disease means your kidneys are not working as well as they should, and proteinuria means there is protein in your urine, which can be a sign of worsening kidney function. Please reschedule your nephrology appointment. We have ordered blood work to check your kidney function and cholesterol, and we obtained a urine sample to assess protein levels. We may consider adding medications like Jardiance or lisinopril if proteinuria persists.  -VITAMIN B12 DEFICIENCY WITH NEUROPATHY: Vitamin B12 deficiency can cause nerve damage, leading to neuropathy, which affects your balance and sensation. We have checked your thiamine  levels with blood work and will adjust your thiamine  supplementation as needed based on the results.  -VITAMIN D  DEFICIENCY: Vitamin D  deficiency means you have low levels of vitamin D , which is important for bone health. You are managing this with weekly supplementation, and we have checked your vitamin D  levels with blood work.  -HISTORY OF ALCOHOL-INDUCED POLYNEUROPATHY:  Alcohol-induced polyneuropathy is nerve damage caused by excessive alcohol use, contributing to your balance issues. You have been abstinent from alcohol since your last visit. We have referred you to outpatient physical therapy to help improve your balance and mobility.  -GENERAL HEALTH MAINTENANCE: We discussed routine health maintenance and encouraged you to have regular follow-ups with your specialists.  INSTRUCTIONS: Please recheck your blood pressure in two weeks. Reschedule your nephrology appointment as soon as possible. Follow up with blood work to check your kidney function, cholesterol, thiamine , and vitamin D  levels. Attend outpatient physical therapy for balance and mobility improvement.                      Contains text generated by Abridge.                                 Contains text generated by Abridge.                          Contains text generated by Abridge.                                 Contains text generated by Abridge.   "

## 2024-10-01 NOTE — Progress Notes (Signed)
 "   Patient ID: Mary [name], female    DOB: 1960/11/20  MRN: [id number]  CC: Follow-up (Follow-up. /Requesting PT in a facility /No to flu vax. )   Subjective: Mary [name] is a 64 y.o. female who presents for chronic ds management. 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  History of Present Illness Mary [name] is a 64 year old female with congestive heart failure and valvular heart disease who presents for a follow-up visit.  VHD/CHF: She has a history of congestive heart failure and valvular heart disease. She is currently taking Coumadin  and is followed by cardiology anticoag clinic.  She denies any bruising or bleeding.   She had seen cardiology PA in September of last year with palpitations and chest pain.  She was prescribed carvedilol  3.125 mg to start taking after she turned in Zio patch. she has the medication but has not started taking due to uncertainty about its purpose. A heart monitor revealed runs of ventricular and supraventricular tachycardia and she was advise to take the Coreg . Last echo done 11/2023 showed an ejection fraction of 50-55%.  She reports chronic shortness of breath, which worsens when lying down at night. No LE edema or CP.  Vit D def: She has a history of vitamin D  deficiency and is taking a supplement once a week.   She also has a history of B12 and thiamine  deficiency, but she ran out of thiamine  a week ago and was previously taking it daily instead of five days a week as advised based on last level which was elevated.  CKD  3b/macroalbumin: She saw nephrology at [facility] in July of last year.  She missed her follow-up appointment which was scheduled for November of last year.  She did not call to cancel the appointment or to reschedule.  She had 1.4 g of protein in the urine 6 months ago.  She experiences balance issues due to hx of ETOH peripheral neuropathy and B12 def and uses a cane. She previously had home physical therapy, which ended about a year ago, and is interested in outpatient physical therapy. No recent falls.  She has not received a Retail Banker that was ordered for her in September. She lives in a second-floor apartment without an engineer, structural and is working with Legal Aid to find a first-floor apartment. -reports she has completely stopped all ETOH beverages for months.  C/o some soreness and swelling in both knees since yesterday. No initiating factors. Wrapped knees with ace wrap and applied Witch Hazel this a.m which has helped.    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   Pharyngitis 12/06/2022   Neuropathy 12/06/2022   Tachycardia 12/05/2022   Supratherapeutic INR 12/05/2022   Hyperkalemia 12/05/2022   Hypotension due to hypovolemia 07/19/2022   Prolonged QT interval 07/19/2022   Marijuana user 12/24/2020   LFT elevation    Acquired pyloric stenosis    GI bleed 11/24/2020  Alcoholic peripheral neuropathy 09/01/2020   Thiamin deficiency 09/01/2020   Paresthesia    Vitamin B12 deficiency 05/04/2020   Vomiting 04/24/2020   GERD (gastroesophageal reflux disease)    Marijuana abuse    Macrocytic anemia 04/04/2019   Intermittent complete heart block (HCC)    Syncope 03/28/2019   Orthostatic hypotension 01/11/2017   Elevated liver enzymes 01/11/2017   Atypical chest pain 06/05/2016    Acute renal failure superimposed on stage 3a chronic kidney disease (HCC) 01/10/2016   Elevated INR 01/10/2016   TIA (transient ischemic attack) 09/13/2015   CVA (cerebral vascular accident) (HCC) 09/12/2015   SOB (shortness of breath) 02/24/2014   First degree heart block 06/17/2013   (HFpEF) heart failure with preserved ejection fraction (HCC) 06/16/2013   History of bacterial endocarditis 06/16/2013   Warfarin anticoagulation 06/10/2013   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   Femoral hernia 05/23/2013   Mitral valve insufficiency 04/22/2013   Tricuspid regurgitation 04/22/2013   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   SVT (supraventricular tachycardia) (HCC) 03/16/2013     Medications Ordered Prior to Encounter[1]  Allergies[2]  Social History   Socioeconomic History   Marital status: Single    Spouse name: Not on file   Number of children: Not on file   Years of education: Not on file   Highest education level: Not on file  Occupational History   Occupation: unemployed  Tobacco Use   Smoking status: Former    Current packs/day: 0.00    Average packs/day: 0.5 packs/day for 46.0 years (23.0 ttl pk-yrs)    Types: Cigarettes    Start date: 69    Quit date: 2022    Years since quitting: 4.0   Smokeless tobacco: Never  Vaping Use   Vaping status: Never Used  Substance and Sexual Activity   Alcohol use: Not Currently    Comment:  glass of wine   Drug use: Yes    Types: Marijuana    Comment: chews on it periodically   Sexual activity: Never  Other Topics Concern   Not on file  Social History Narrative   Right handed   Two story home apartment    Social Drivers of Health   Tobacco Use: Medium Risk (10/01/2024)   Patient History    Smoking Tobacco Use: Former    Smokeless Tobacco Use: Never    Passive Exposure: Not on Actuary Strain: High Risk (11/24/2023)   Overall Financial Resource Strain (CARDIA)     Difficulty of Paying Living Expenses: Very hard  Food Insecurity: No Food Insecurity (02/26/2024)   Hunger Vital Sign    Worried About Running Out of Food in the Last Year: Never true    Ran Out of Food in the Last Year: Never true  Transportation Needs: No Transportation Needs (02/26/2024)   PRAPARE - Administrator, Civil Service (Medical): No    Lack of Transportation (Non-Medical): No  Physical Activity: Not on file  Stress: Not on file  Social Connections: Not on file  Intimate Partner Violence: Not At Risk (02/26/2024)   Humiliation, Afraid, Rape, and Kick questionnaire    Fear of Current or Ex-Partner: No    Emotionally Abused: No    Physically Abused: No    Sexually Abused: No  Depression (PHQ2-9): Low Risk (05/31/2024)   Depression (PHQ2-9)    PHQ-2 Score: 0  Alcohol Screen: Not on file  Housing: High Risk (02/26/2024)   Housing Stability Vital Sign  Unable to Pay for Housing in the Last Year: Yes    Number of Times Moved in the Last Year: 0    Homeless in the Last Year: No  Utilities: At Risk (02/26/2024)   AHC Utilities    Threatened with loss of utilities: Yes  Health Literacy: Not on file    Family History  Problem Relation Age of Onset   Kidney disease Mother        Had one Kidney removed   Stroke Father    Cancer Father    Sarcoidosis Sister    Heart attack Neg Hx    Colon cancer Neg Hx    Stomach cancer Neg Hx    Pancreatic cancer Neg Hx    Esophageal cancer Neg Hx     Past Surgical History:  Procedure Laterality Date   APPENDECTOMY  ~1978   BALLOON DILATION N/A 11/27/2020   Procedure: BALLOON DILATION;  Surgeon: Avram Lupita BRAVO, MD;  Location: [facility] ENDOSCOPY;  Service: Endoscopy;  Laterality: N/A;   BIOPSY  05/01/2020   Procedure: BIOPSY;  Surgeon: Teressa Toribio SQUIBB, MD;  Location: [facility] ENDOSCOPY;  Service: Endoscopy;;   CARDIAC CATHETERIZATION     CARDIAC VALVE REPLACEMENT     CESAREAN SECTION  1983   CESAREAN SECTION WITH BILATERAL TUBAL  LIGATION  1999   COLONOSCOPY WITH PROPOFOL  N/A 05/01/2020   Procedure: COLONOSCOPY WITH PROPOFOL ;  Surgeon: Teressa Toribio SQUIBB, MD;  Location: [facility] ENDOSCOPY;  Service: Endoscopy;  Laterality: N/A;   ESOPHAGOGASTRODUODENOSCOPY (EGD) WITH PROPOFOL  N/A 05/01/2020   Procedure: ESOPHAGOGASTRODUODENOSCOPY (EGD) WITH PROPOFOL ;  Surgeon: Teressa Toribio SQUIBB, MD;  Location: [facility] ENDOSCOPY;  Service: Endoscopy;  Laterality: N/A;   ESOPHAGOGASTRODUODENOSCOPY (EGD) WITH PROPOFOL  N/A 11/27/2020   Procedure: ESOPHAGOGASTRODUODENOSCOPY (EGD) WITH PROPOFOL ;  Surgeon: Avram Lupita BRAVO, MD;  Location: [facility] ENDOSCOPY;  Service: Endoscopy;  Laterality: N/A;   FEMORAL HERNIA REPAIR Right 05/23/2013   Procedure: HERNIA REPAIR FEMORAL;  Surgeon: Sudie VEAR Laine, MD;  Location: [facility] OR;  Service: Open Heart Surgery;  Laterality: Right;   INTRAOPERATIVE TRANSESOPHAGEAL ECHOCARDIOGRAM N/A 05/23/2013   Procedure: INTRAOPERATIVE TRANSESOPHAGEAL ECHOCARDIOGRAM;  Surgeon: Sudie VEAR Laine, MD;  Location: [facility] OR;  Service: Open Heart Surgery;  Laterality: N/A;   LAPAROSCOPIC CHOLECYSTECTOMY  2003   LEFT AND RIGHT HEART CATHETERIZATION WITH CORONARY ANGIOGRAM N/A 03/22/2013   Procedure: LEFT AND RIGHT HEART CATHETERIZATION WITH CORONARY ANGIOGRAM;  Surgeon: Lonni JONETTA Cash, MD;  Location[facility] CATH LAB;  Service: Cardiovascular;  Laterality: N/A;   MINIMALLY INVASIVE TRICUSPID VALVE REPAIR Right 05/23/2013   Procedure: MINIMALLY INVASIVE TRICUSPID VALVE REPAIR;  Surgeon: Sudie VEAR Laine, MD;  Location: [facility] OR;  Service: Open Heart Surgery;  Laterality: Right;   MITRAL VALVE REPLACEMENT N/A 05/23/2013   Procedure: MITRAL VALVE (MV) REPLACEMENT;  Surgeon: Sudie VEAR Laine, MD;  Location: [facility] OR;  Service: Open Heart Surgery;  Laterality: N/A;   MULTIPLE EXTRACTIONS WITH ALVEOLOPLASTY N/A 04/04/2013   Procedure: Extraction of tooth #'s 1,8,9,13,14,15,23,24,25,26 with alveoloplasty and gross debridement of remaining teeth;  Surgeon: Tanda JULIANNA Fanny, DDS;   Location: [facility] OR;  Service: Oral Surgery;  Laterality: N/A;   TEE WITHOUT CARDIOVERSION N/A 03/18/2013   Procedure: TRANSESOPHAGEAL ECHOCARDIOGRAM (TEE);  Surgeon: Ezra GORMAN Shuck, MD;  Location: [facility] ENDOSCOPY;  Service: Cardiovascular;  Laterality: N/A;   TEE WITHOUT CARDIOVERSION N/A 06/17/2013   Procedure: TRANSESOPHAGEAL ECHOCARDIOGRAM (TEE);  Surgeon: Redell GORMAN Shallow, MD;  Location: [facility] ENDOSCOPY;  Service: Cardiovascular;  Laterality: N/A;   TUBAL LIGATION  1999  ROS: Review of Systems Negative except as stated above  PHYSICAL EXAM: BP 99/72   Pulse (!) 111   Temp 98.4 F (36.9 C) (Oral)   Ht 5' 2 (1.575 m)   Wt 148 lb (67.1 kg)   LMP 01/28/2013   SpO2 98%   BMI 27.07 kg/m   Physical Exam  General appearance - alert, well appearing, and in no distress Mental status - normal mood, behavior, speech, dress, motor activity, and thought processes Neck - supple, no significant adenopathy Chest - clear to auscultation, no wheezes, rales or rhonchi, symmetric air entry Heart - regular rate and rhythm with clicking heard of the mechanical valve  Neurological - She has some spasticity in the legs. Decreased sensation grossly in both lower legs. She ambulates with a cane. Gait appears spastic/wobbly and wide base with a low foot to floor clearance  Musculoskeletal - knees: slight edema medially both knees. No point tenderness. Mild discomfort with PROM LT Extremities - no LE edema      05/31/2024    3:31 PM 02/26/2024   10:06 AM 06/28/2022    3:25 PM  Depression screen PHQ 2/9  Decreased Interest 0 0 0  Down, Depressed, Hopeless 0 0 0  PHQ - 2 Score 0 0 0  Altered sleeping   3  Tired, decreased energy   0  Change in appetite   1  Feeling bad or failure about yourself    0  Trouble concentrating   0  Moving slowly or fidgety/restless   3  Suicidal thoughts   0  PHQ-9 Score   7      Data saved with a previous flowsheet row definition       Latest Ref Rng & Units 06/20/2024    10:54 AM 11/21/2023    3:12 PM 07/17/2023   12:08 PM  CMP  Glucose 70 - 99 mg/dL 94  88  899   BUN 8 - 27 mg/dL 21  17  16    Creatinine 0.57 - 1.00 mg/dL 8.38  8.56  8.30   Sodium 134 - 144 mmol/L 144  145  141   Potassium 3.5 - 5.2 mmol/L 5.1  4.8  5.0   Chloride 96 - 106 mmol/L 108  108  104   CO2 20 - 29 mmol/L 18  21  19    Calcium  8.7 - 10.3 mg/dL 9.9  9.8  9.4   Total Protein 6.0 - 8.5 g/dL   7.1   Total Bilirubin 0.0 - 1.2 mg/dL   0.2   Alkaline Phos 44 - 121 IU/L   118   AST 0 - 40 IU/L   19   ALT 0 - 32 IU/L   9    Lipid Panel     Component Value Date/Time   CHOL 189 05/31/2018 1108   TRIG 300 (H) 05/31/2018 1108   HDL 68 05/31/2018 1108   CHOLHDL 2.8 05/31/2018 1108   CHOLHDL 3.2 06/06/2016 0135   VLDL 22 06/06/2016 0135   LDLCALC 61 05/31/2018 1108    CBC    Component Value Date/Time   WBC 9.4 06/20/2024 1054   WBC 6.7 01/25/2023 0722   RBC 3.63 (L) 06/20/2024 1054   RBC 2.97 (L) 01/25/2023 0722   HGB 11.9 06/20/2024 1054   HCT 37.5 06/20/2024 1054   PLT 395 06/20/2024 1054   MCV 103 (H) 06/20/2024 1054   MCH 32.8 06/20/2024 1054   MCH 33.0 01/25/2023 0722   MCHC 31.7 06/20/2024  1054   MCHC 31.3 01/25/2023 0722   RDW 13.5 06/20/2024 1054   LYMPHSABS 1.3 01/24/2023 1013   LYMPHSABS 1.9 06/26/2020 1218   MONOABS 0.6 01/24/2023 1013   EOSABS 0.2 01/24/2023 1013   EOSABS 0.2 06/26/2020 1218   BASOSABS 0.0 01/24/2023 1013   BASOSABS 0.0 06/26/2020 1218    ASSESSMENT AND PLAN: 1. Diastolic CHF, chronic (HCC) (Primary) Pt has chronic SOB but not fluid overload on exam. Hold on starting Coreg  as BP on low side today. Will bring back in 2-4 wks to recheck - Lipid panel - Comprehensive metabolic panel with GFR  2. CKD stage 3b, GFR 30-44 ml/min (HCC) Advised to call and reschedule her appt with her nephrologist at [facility]. Consider Jardiance or low dose ACE-I - Microalbumin / creatinine urine ratio - CBC  3. Alcoholic peripheral neuropathy RF Folic  acid - folic acid  (FOLVITE ) 1 MG tablet; Take 1 tablet (1 mg total) by mouth daily.  Dispense: 90 tablet; Refill: 0  4. Thiamin deficiency Will recheck level. She was suppose to take thiamine  supplement 5 days a wk but was still taking every day until she ran out recently - Vitamin B1  5. Vitamin D  deficiency Continue vit D supplement  6. Gait disturbance Associated with peripheral neuropathy  Pt requesting some P.T for gait safety/training Message sent to CMA inquiring about order that was sent to [facility] for rollator walker on last visit. Pt has not received it - Ambulatory referral to Physical Therapy  7. Acute pain of both knees Monitor for now. Advise Tylenol  Arthritis PRN  8. Cardiac arrhythmia, unspecified cardiac arrhythmia type Hold on Coreg  given low BP today.     Patient was given the opportunity to ask questions.  Patient verbalized understanding of the plan and was able to repeat key elements of the plan.   This documentation was completed using Paediatric nurse.  Any transcriptional errors are unintentional.  Orders Placed This Encounter  Procedures   Lipid panel   Comprehensive metabolic panel with GFR   Microalbumin / creatinine urine ratio   Vitamin B1   CBC   Ambulatory referral to Physical Therapy     Requested Prescriptions   Signed Prescriptions Disp Refills   folic acid  (FOLVITE ) 1 MG tablet 90 tablet 0    Sig: Take 1 tablet (1 mg total) by mouth daily.    Return in about 4 weeks (around 10/29/2024) for for rechech BP.  Barnie Louder, MD, FACP     [1]  Current Outpatient Medications on File Prior to Visit  Medication Sig Dispense Refill   acetaminophen  (TYLENOL ) 500 MG tablet Take 1,000 mg by mouth in the morning, at noon, and at bedtime.     gabapentin  (NEURONTIN ) 300 MG capsule Take 1 capsule (300 mg total) by mouth 3 (three) times daily. 270 capsule 2   latanoprost  (XALATAN ) 0.005 % ophthalmic solution Place 1  drop into both eyes at bedtime.     latanoprost  (XALATAN ) 0.005 % ophthalmic solution Place 1 drop into both eyes at bedtime. 7.5 mL 3   pantoprazole  (PROTONIX ) 40 MG tablet Take 1 tablet (40 mg total) by mouth 2 (two) times daily before a meal. 180 tablet 0   thiamine  (VITAMIN B1) 100 MG tablet Take 1 tablet (100 mg total) by mouth daily. 30 tablet 5   Vitamin D , Ergocalciferol , (DRISDOL ) 1.25 MG (50000 UNIT) CAPS capsule Take 1 capsule (50,000 Units total) by mouth once a week. For 12 weeks, then recheck  level in the office 12 capsule 0   warfarin (COUMADIN ) 5 MG tablet Take 1-1.5 tablets (5-7.5 mg total) by mouth daily. 30 tablet 1   carvedilol  (COREG ) 3.125 MG tablet Take 1 tablet (3.125 mg total) by mouth 2 (two) times daily. 180 tablet 3   No current facility-administered medications on file prior to visit.  [2]  Allergies Allergen Reactions   Ibuprofen  Itching   Oxycodone  Itching    Pt states no longer makes her itchy   Tape Itching, Rash and Other (See Comments)    THE ONLY TAPE THAT IS TOLERATED IS PAPER TAPE. EKG leads inflame the skin   "

## 2024-10-02 ENCOUNTER — Ambulatory Visit: Payer: Self-pay | Admitting: Internal Medicine

## 2024-10-02 ENCOUNTER — Telehealth: Payer: Self-pay | Admitting: Internal Medicine

## 2024-10-02 DIAGNOSIS — N1832 Chronic kidney disease, stage 3b: Secondary | ICD-10-CM

## 2024-10-02 DIAGNOSIS — E875 Hyperkalemia: Secondary | ICD-10-CM

## 2024-10-02 DIAGNOSIS — R809 Proteinuria, unspecified: Secondary | ICD-10-CM

## 2024-10-02 MED ORDER — ATORVASTATIN CALCIUM 20 MG PO TABS: 20.0000 mg | ORAL_TABLET | Freq: Every day | ORAL | 1 refills | Status: AC

## 2024-10-02 MED ORDER — LOKELMA 5 G PO PACK: PACK | ORAL | 0 refills | Status: AC

## 2024-10-02 NOTE — Telephone Encounter (Signed)
 Let pt know that her cholesterol is very high. This increases her risk for having heart attack or another stroke. I would like to start her on cholesterol lowering medication called Atorvastatin  (Lipitor) to help lower cholesterol. Prescription sent to her pharmacy. Has increase protein in the urine indicating poor kidney function. Very improtant that she gets back in with the nephrologist. The medication that I want to place her on to help decrease the protein can not be used at this time as it may cause further increase in her potassium level.   In regard to the rollator walker, [employer] [facility] and [facility] are not in network for her insurance [employer]. She will need to call her insurance and find out what medical supply company is in their network so we can send the prescription there.

## 2024-10-03 NOTE — Telephone Encounter (Signed)
 Called & spoke to the patient. Verified name & DOB. Informed of results & recommendations. Informed that a prescription was sent to the pharmacy. Informed to reach out to her insurance company to verify a medical supply company in network to send the rollator walker prescription to. Patient will call back with a company name & expressed verbal understanding of all discussed.

## 2024-10-04 ENCOUNTER — Other Ambulatory Visit: Payer: Self-pay | Admitting: Internal Medicine

## 2024-10-04 DIAGNOSIS — D539 Nutritional anemia, unspecified: Secondary | ICD-10-CM

## 2024-10-04 DIAGNOSIS — E538 Deficiency of other specified B group vitamins: Secondary | ICD-10-CM

## 2024-10-04 LAB — CBC
Hematocrit: 37.5 % (ref 34.0–46.6)
Hemoglobin: 11.9 g/dL (ref 11.1–15.9)
MCH: 32.7 pg (ref 26.6–33.0)
MCHC: 31.7 g/dL (ref 31.5–35.7)
MCV: 103 fL — ABNORMAL HIGH (ref 79–97)
Platelets: 385 x10E3/uL (ref 150–450)
RBC: 3.64 x10E6/uL — ABNORMAL LOW (ref 3.77–5.28)
RDW: 13.1 % (ref 11.7–15.4)
WBC: 10.2 x10E3/uL (ref 3.4–10.8)

## 2024-10-04 LAB — COMPREHENSIVE METABOLIC PANEL WITH GFR
ALT: 8 IU/L (ref 0–32)
AST: 14 IU/L (ref 0–40)
Albumin: 4.5 g/dL (ref 3.9–4.9)
Alkaline Phosphatase: 129 IU/L (ref 49–135)
BUN/Creatinine Ratio: 14 (ref 12–28)
BUN: 24 mg/dL (ref 8–27)
Bilirubin Total: 0.3 mg/dL (ref 0.0–1.2)
CO2: 17 mmol/L — ABNORMAL LOW (ref 20–29)
Calcium: 9.9 mg/dL (ref 8.7–10.3)
Chloride: 105 mmol/L (ref 96–106)
Creatinine, Ser: 1.71 mg/dL — ABNORMAL HIGH (ref 0.57–1.00)
Globulin, Total: 3.2 g/dL (ref 1.5–4.5)
Glucose: 84 mg/dL (ref 70–99)
Potassium: 5.5 mmol/L — ABNORMAL HIGH (ref 3.5–5.2)
Sodium: 138 mmol/L (ref 134–144)
Total Protein: 7.7 g/dL (ref 6.0–8.5)
eGFR: 33 mL/min/1.73 — ABNORMAL LOW

## 2024-10-04 LAB — LIPID PANEL
Chol/HDL Ratio: 8.1 ratio — ABNORMAL HIGH (ref 0.0–4.4)
Cholesterol, Total: 350 mg/dL — ABNORMAL HIGH (ref 100–199)
HDL: 43 mg/dL
LDL Chol Calc (NIH): 225 mg/dL — ABNORMAL HIGH (ref 0–99)
Triglycerides: 382 mg/dL — ABNORMAL HIGH (ref 0–149)
VLDL Cholesterol Cal: 82 mg/dL — ABNORMAL HIGH (ref 5–40)

## 2024-10-04 LAB — VITAMIN B1: Thiamine: 108.5 nmol/L (ref 66.5–200.0)

## 2024-10-04 LAB — MICROALBUMIN / CREATININE URINE RATIO
Creatinine, Urine: 193.7 mg/dL
Microalb/Creat Ratio: 512 mg/g{creat} — ABNORMAL HIGH (ref 0–29)
Microalbumin, Urine: 992.1 ug/mL

## 2024-10-04 MED ORDER — THIAMINE HCL 100 MG PO TABS: ORAL_TABLET | ORAL | 1 refills | Status: DC

## 2024-10-09 ENCOUNTER — Telehealth: Payer: Self-pay | Admitting: Internal Medicine

## 2024-10-09 NOTE — Telephone Encounter (Signed)
 I spoke to the patient and she said that she has not been able to reach Norleen Sar, [employer] of  [facility]) regarding her housing issues and she would like me to try to contact him for her.  I told her that I will reach out to the supervising attorneys with [employer] for assistance and I sent an email to the attorneys as discussed.  She then asked about resources for section 8 and 1st floor housing. I recommended she contact the [employer] and she said she is already on a wait list.  I also suggested that she contact [employer] and she said she has their number and will call them. She has not contacted them yet.  I told her to please call me back with any questions/concerns.

## 2024-10-09 NOTE — Telephone Encounter (Signed)
 Copied from CRM [id number]. Topic: General - Other >> Oct 09, 2024 11:23 AM   Joesph NOVAK wrote:  Reason for CRM: patient is calling to speak to [facility].

## 2024-10-10 NOTE — Telephone Encounter (Signed)
 Message received from Schuyler Allis, MSW/ [employer]-  I have asked Norleen to give Ms. Ford a call back as soon as he's able.

## 2024-10-11 ENCOUNTER — Ambulatory Visit: Payer: Self-pay | Attending: Internal Medicine

## 2024-10-11 DIAGNOSIS — E875 Hyperkalemia: Secondary | ICD-10-CM

## 2024-10-12 ENCOUNTER — Ambulatory Visit: Payer: Self-pay | Admitting: Internal Medicine

## 2024-10-12 LAB — BASIC METABOLIC PANEL WITH GFR
BUN/Creatinine Ratio: 15 (ref 12–28)
BUN: 23 mg/dL (ref 8–27)
CO2: 17 mmol/L — ABNORMAL LOW (ref 20–29)
Calcium: 9.8 mg/dL (ref 8.7–10.3)
Chloride: 109 mmol/L — ABNORMAL HIGH (ref 96–106)
Creatinine, Ser: 1.5 mg/dL — ABNORMAL HIGH (ref 0.57–1.00)
Glucose: 80 mg/dL (ref 70–99)
Potassium: 5.2 mmol/L (ref 3.5–5.2)
Sodium: 140 mmol/L (ref 134–144)
eGFR: 39 mL/min/1.73 — ABNORMAL LOW

## 2024-10-14 ENCOUNTER — Ambulatory Visit: Admitting: *Deleted

## 2024-10-14 DIAGNOSIS — Z7901 Long term (current) use of anticoagulants: Secondary | ICD-10-CM | POA: Diagnosis not present

## 2024-10-14 DIAGNOSIS — Z954 Presence of other heart-valve replacement: Secondary | ICD-10-CM | POA: Diagnosis not present

## 2024-10-14 LAB — POCT INR: POC INR: 3.3

## 2024-10-14 MED ORDER — WARFARIN SODIUM 5 MG PO TABS: ORAL_TABLET | ORAL | 0 refills | Status: AC

## 2024-10-14 NOTE — Progress Notes (Signed)
 Lab Results  Component Value Date   INR 3.3 10/14/2024   INR 1.8 (A) 09/30/2024   INR 5.9 (A) 09/05/2024    Description   INR 3.3;Continue taking warfarin 1 tablet daily except for 1.5 tablets Sunday, Tuesday, and Thursday. Continue regular ensure intake (1 per week) or the leafy veggies.  Recheck INR 3 weeks. Coumadin  Clinic [phone]

## 2024-10-14 NOTE — Patient Instructions (Signed)
 Description   INR 3.3;Continue taking warfarin 1 tablet daily except for 1.5 tablets Sunday, Tuesday, and Thursday. Continue regular ensure intake (1 per week) or the leafy veggies.  Recheck INR 3 weeks. Coumadin  Clinic [phone]

## 2024-10-15 ENCOUNTER — Ambulatory Visit: Admitting: Family

## 2024-10-15 DIAGNOSIS — B351 Tinea unguium: Secondary | ICD-10-CM

## 2024-10-16 ENCOUNTER — Encounter: Payer: Self-pay | Admitting: Family

## 2024-10-16 NOTE — Progress Notes (Signed)
 "  Office Visit Note   Patient: Mary [name]           Date of Birth: 09/27/1960           MRN: [id number] Visit Date: 10/15/2024              Requested by: Vicci Barnie NOVAK, MD [address] [place],  [place] [address] PCP: Vicci Barnie NOVAK, MD  Chief Complaint  Patient presents with   Right Leg - Follow-up   Left Leg - Follow-up      HPI: The patient is a 64 year old woman who presents today for evaluation of bilateral feet.   She is unfortunately no longer able to safely trim her own nails due to history of a CVA as well as neuropathy  Assessment & Plan: Visit Diagnoses: No diagnosis found.  Plan: Nails trimmed x 10.  Patient tolerated well.  She will follow-up in 3 months for reevaluation.  Follow-Up Instructions: No follow-ups on file.   Ortho Exam  Patient is alert, oriented, no adenopathy, well-dressed, normal affect, normal respiratory effort. On examination bilateral feet she does have thickened and discolored onychomycotic nails x 10 there are no calluses ulcers or signs of impending ulceration.  Nails were trimmed x 10 after informed consent.  Patient tolerated well.  Imaging: No results found. No images are attached to the encounter.  Labs: Lab Results  Component Value Date   HGBA1C 4.2 (L) 08/18/2020   HGBA1C 4.8 06/06/2016   HGBA1C 5.4 09/13/2015   ESRSEDRATE 14 08/18/2020   ESRSEDRATE 21 06/03/2020   CRP <0.5 08/18/2020   REPTSTATUS 12/13/2022 FINAL 12/08/2022   GRAMSTAIN  08/19/2020    WBC PRESENT, PREDOMINANTLY MONONUCLEAR NO ORGANISMS SEEN CYTOSPIN SMEAR    CULT  12/08/2022    NO GROWTH 5 DAYS Performed at [facility], [address], [place], [place] [address]    LABORGA NO GROWTH 5 DAYS 05/09/2013   LABORGA NO GROWTH 5 DAYS 05/09/2013     Lab Results  Component Value Date   ALBUMIN  4.5 10/01/2024   ALBUMIN  4.3 07/17/2023   ALBUMIN  3.1 (L) 01/24/2023    Lab Results  Component Value Date   MG 1.8  12/07/2022   MG 2.0 12/05/2022   MG 2.0 07/19/2022   Lab Results  Component Value Date   VD25OH 6.4 (L) 06/03/2020    No results found for: PREALBUMIN    Latest Ref Rng & Units 10/01/2024   11:35 AM 06/20/2024   10:54 AM 11/21/2023    3:12 PM  CBC EXTENDED  WBC 3.4 - 10.8 x10E3/uL 10.2  9.4  8.6   RBC 3.77 - 5.28 x10E6/uL 3.64  3.63  3.59   Hemoglobin 11.1 - 15.9 g/dL 88.0  88.0  88.7   HCT 34.0 - 46.6 % 37.5  37.5  34.8   Platelets 150 - 450 x10E3/uL 385  395  353      There is no height or weight on file to calculate BMI.  Orders:  No orders of the defined types were placed in this encounter.  No orders of the defined types were placed in this encounter.    Procedures: No procedures performed  Clinical Data: No additional findings.  ROS:  All other systems negative, except as noted in the HPI. Review of Systems  Objective: Vital Signs: LMP 01/28/2013   Specialty Comments:  No specialty comments available.  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   Pharyngitis 12/06/2022   Neuropathy 12/06/2022   Tachycardia 12/05/2022   Supratherapeutic INR 12/05/2022   Hyperkalemia 12/05/2022   Hypotension due to hypovolemia 07/19/2022   Prolonged QT interval 07/19/2022   Marijuana user 12/24/2020   LFT elevation    Acquired pyloric stenosis    GI bleed 11/24/2020   Alcoholic peripheral neuropathy 09/01/2020   Thiamin deficiency 09/01/2020   Paresthesia    Vitamin B12 deficiency 05/04/2020   Vomiting 04/24/2020   GERD (gastroesophageal reflux disease)    Marijuana abuse    Macrocytic anemia 04/04/2019   Intermittent complete heart block (HCC)    Syncope 03/28/2019   Orthostatic hypotension 01/11/2017   Elevated liver enzymes 01/11/2017   Atypical chest  pain 06/05/2016   Acute renal failure superimposed on stage 3a chronic kidney disease (HCC) 01/10/2016   Elevated INR 01/10/2016   TIA (transient ischemic attack) 09/13/2015   CVA (cerebral vascular accident) (HCC) 09/12/2015   SOB (shortness of breath) 02/24/2014   First degree heart block 06/17/2013   (HFpEF) heart failure with preserved ejection fraction (HCC) 06/16/2013   History of bacterial endocarditis 06/16/2013   Warfarin anticoagulation 06/10/2013   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   Femoral hernia 05/23/2013   Mitral valve insufficiency 04/22/2013   Tricuspid regurgitation 04/22/2013   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   SVT (supraventricular tachycardia) (HCC) 03/16/2013   Past Medical History:  Diagnosis Date   Alcoholism (HCC)    B12 deficiency    Carotid stenosis    Carotid US  2/17: bilateral ICA 1-39% >> FU prn   Cataract    OU   Childhood asthma    Chronic diastolic CHF (congestive heart failure) (HCC)    Complete heart block (HCC)    Epigastric hernia 200's   GERD (gastroesophageal reflux disease)    Glaucoma suspect    History of blood transfusion    14 w/1st pregnancy; 2 w/last C-section (03/15/2013)   Hypertension    no pcp   will go to [facility]   Macrocytic anemia    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    Protein calorie malnutrition    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   33mm Sorin Carbomedics mechanical prosthesis via right mini thoracotomy approach   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   Complex valvuloplasty including Cor-matrix ECM patch augmentation of anterior and lateral leaflets with 26mm Edwards mc3 ring annuloplasty via right mini-thoracotomy approach   Sinus tachycardia    SVT (supraventricular tachycardia) 03/16/2013   TIA (transient ischemic attack)     Family History  Problem Relation Age of  Onset   Kidney disease Mother        Had one Kidney removed   Stroke Father    Cancer Father    Sarcoidosis Sister    Heart attack Neg Hx    Colon cancer Neg Hx    Stomach cancer Neg Hx    Pancreatic cancer Neg Hx    Esophageal cancer Neg Hx     Past Surgical History:  Procedure Laterality Date   APPENDECTOMY  ~1978   BALLOON DILATION N/A 11/27/2020   Procedure: BALLOON DILATION;  Surgeon: Avram Lupita BRAVO, MD;  Location: [facility] ENDOSCOPY;  Service: Endoscopy;  Laterality: N/A;   BIOPSY  05/01/2020  Procedure: BIOPSY;  Surgeon: Teressa Toribio SQUIBB, MD;  Location: [facility] ENDOSCOPY;  Service: Endoscopy;;   CARDIAC CATHETERIZATION     CARDIAC VALVE REPLACEMENT     CESAREAN SECTION  1983   CESAREAN SECTION WITH BILATERAL TUBAL LIGATION  1999   COLONOSCOPY WITH PROPOFOL  N/A 05/01/2020   Procedure: COLONOSCOPY WITH PROPOFOL ;  Surgeon: Teressa Toribio SQUIBB, MD;  Location: [facility] ENDOSCOPY;  Service: Endoscopy;  Laterality: N/A;   ESOPHAGOGASTRODUODENOSCOPY (EGD) WITH PROPOFOL  N/A 05/01/2020   Procedure: ESOPHAGOGASTRODUODENOSCOPY (EGD) WITH PROPOFOL ;  Surgeon: Teressa Toribio SQUIBB, MD;  Location: [facility] ENDOSCOPY;  Service: Endoscopy;  Laterality: N/A;   ESOPHAGOGASTRODUODENOSCOPY (EGD) WITH PROPOFOL  N/A 11/27/2020   Procedure: ESOPHAGOGASTRODUODENOSCOPY (EGD) WITH PROPOFOL ;  Surgeon: Avram Lupita BRAVO, MD;  Location: [facility] ENDOSCOPY;  Service: Endoscopy;  Laterality: N/A;   FEMORAL HERNIA REPAIR Right 05/23/2013   Procedure: HERNIA REPAIR FEMORAL;  Surgeon: Sudie VEAR Laine, MD;  Location: [facility] OR;  Service: Open Heart Surgery;  Laterality: Right;   INTRAOPERATIVE TRANSESOPHAGEAL ECHOCARDIOGRAM N/A 05/23/2013   Procedure: INTRAOPERATIVE TRANSESOPHAGEAL ECHOCARDIOGRAM;  Surgeon: Sudie VEAR Laine, MD;  Location: [facility] OR;  Service: Open Heart Surgery;  Laterality: N/A;   LAPAROSCOPIC CHOLECYSTECTOMY  2003   LEFT AND RIGHT HEART CATHETERIZATION WITH CORONARY ANGIOGRAM N/A 03/22/2013   Procedure: LEFT AND RIGHT HEART CATHETERIZATION WITH  CORONARY ANGIOGRAM;  Surgeon: Lonni JONETTA Cash, MD;  Location[facility] C[facility] LAB;  Service: Cardiovascular;  Laterality: N/A;   MINIMALLY INVASIVE TRICUSPID VALVE REPAIR Right 05/23/2013   Procedure: MINIMALLY INVASIVE TRICUSPID VALVE REPAIR;  Surgeon: Sudie VEAR Laine, MD;  Location: [facility] OR;  Service: Open Heart Surgery;  Laterality: Right;   MITRAL VALVE REPLACEMENT N/A 05/23/2013   Procedure: MITRAL VALVE (MV) REPLACEMENT;  Surgeon: Sudie VEAR Laine, MD;  Location: [facility] OR;  Service: Open Heart Surgery;  Laterality: N/A;   MULTIPLE EXTRACTIONS WITH ALVEOLOPLASTY N/A 04/04/2013   Procedure: Extraction of tooth #'s 1,8,9,13,14,15,23,24,25,26 with alveoloplasty and gross debridement of remaining teeth;  Surgeon: Tanda JULIANNA Fanny, DDS;  Location: [facility] OR;  Service: Oral Surgery;  Laterality: N/A;   TEE WITHOUT CARDIOVERSION N/A 03/18/2013   Procedure: TRANSESOPHAGEAL ECHOCARDIOGRAM (TEE);  Surgeon: Ezra GORMAN Shuck, MD;  Location: [facility] ENDOSCOPY;  Service: Cardiovascular;  Laterality: N/A;   TEE WITHOUT CARDIOVERSION N/A 06/17/2013   Procedure: TRANSESOPHAGEAL ECHOCARDIOGRAM (TEE);  Surgeon: Redell GORMAN Shallow, MD;  Location: [facility] ENDOSCOPY;  Service: Cardiovascular;  Laterality: N/A;   TUBAL LIGATION  1999   Social History   Occupational History   Occupation: unemployed  Tobacco Use   Smoking status: Former    Current packs/day: 0.00    Average packs/day: 0.5 packs/day for 46.0 years (23.0 ttl pk-yrs)    Types: Cigarettes    Start date: 62    Quit date: 2022    Years since quitting: 4.0   Smokeless tobacco: Never  Vaping Use   Vaping status: Never Used  Substance and Sexual Activity   Alcohol use: Not Currently    Comment:  glass of wine   Drug use: Yes    Types: Marijuana    Comment: chews on it periodically   Sexual activity: Never       "

## 2024-10-28 ENCOUNTER — Other Ambulatory Visit: Payer: Self-pay | Admitting: Internal Medicine

## 2024-10-28 DIAGNOSIS — E538 Deficiency of other specified B group vitamins: Secondary | ICD-10-CM

## 2024-10-28 DIAGNOSIS — D539 Nutritional anemia, unspecified: Secondary | ICD-10-CM

## 2024-10-28 NOTE — Telephone Encounter (Unsigned)
 Copied from CRM [id number]. Topic: Clinical - Medication Refill >> Oct 28, 2024 12:22 PM Nathanel BROCKS wrote: Medication: thiamine  (VITAMIN B1) 100 MG tablet  Has the pt contacted there pharmacy? No  This is the patient's preferred pharmacy:   [facility] [address] [place] [address] Phone: [phone] Fax: [phone]  [facility] [address], [address] [place] [address] Phone: [phone] Fax: [phone]  Is this the correct pharmacy for this prescription? Yes If no, delete pharmacy and type the correct one.   Has the prescription been filled recently? No  Is the patient out of the medication? Yes  Has the patient been seen for an appointment in the last year OR does the patient have an upcoming appointment? Yes  Can we respond through MyChart? No  Agent: Please be advised that Rx refills may take up to 3 business days. We ask that you follow-up with your pharmacy.

## 2024-10-29 ENCOUNTER — Other Ambulatory Visit: Payer: Self-pay

## 2024-10-29 ENCOUNTER — Telehealth: Payer: Self-pay

## 2024-10-29 ENCOUNTER — Other Ambulatory Visit (HOSPITAL_COMMUNITY): Payer: Self-pay

## 2024-10-29 ENCOUNTER — Other Ambulatory Visit: Payer: Self-pay | Admitting: Internal Medicine

## 2024-10-29 DIAGNOSIS — E538 Deficiency of other specified B group vitamins: Secondary | ICD-10-CM

## 2024-10-29 DIAGNOSIS — D539 Nutritional anemia, unspecified: Secondary | ICD-10-CM

## 2024-10-29 DIAGNOSIS — G621 Alcoholic polyneuropathy: Secondary | ICD-10-CM

## 2024-10-29 MED ORDER — LISINOPRIL 20 MG PO TABS
20.0000 mg | ORAL_TABLET | Freq: Every day | ORAL | 1 refills | Status: AC
  Filled 2024-10-29: qty 90, 90d supply, fill #0

## 2024-10-29 MED ORDER — THIAMINE HCL 100 MG PO TABS
100.0000 mg | ORAL_TABLET | ORAL | 1 refills | Status: AC
  Filled 2024-10-29: qty 100, 175d supply, fill #0

## 2024-10-29 NOTE — Telephone Encounter (Signed)
 Called & spoke to the patient. Verified name & DOB. Informed that Dr.Johnson placed a referral for PT on 10/01/2024. Informed patient of their office as follows:  Placed in [facility]  [address] [place], [place]  [address] Ph# [phone]  Patient expressed verbal understanding of all discussed and will cal them to make an appointment.

## 2024-10-29 NOTE — Telephone Encounter (Signed)
 Copied from CRM [id number]. Topic: Referral - Request for Referral >> Oct 28, 2024 12:19 PM Nathanel BROCKS wrote: Did the patient discuss referral with their provider in the last year? Yes (If No - schedule appointment) (If Yes - send message)  Appointment offered? No  Type of order/referral and detailed reason for visit: Pt is requesting physical therapy  Preference of office, provider, location: [facility] [place] area  If referral order, have you been seen by this specialty before? Yes (If Yes, this issue or another issue? When? Where? 1 year ago   Can we respond through MyChart? No   ----------------------------------------------------------------------- From previous Reason for Contact - Medical Advice: Reason for CRM: p

## 2024-10-29 NOTE — Telephone Encounter (Signed)
 Called & spoke to the patient. Verified name & DOB. Informed that a refill was recently sent on 10/04/2024 to the preferred pharmacy. Patient stated that she would like for the prescription to be sent to [facility] for delivery. Please advise.

## 2024-10-30 ENCOUNTER — Other Ambulatory Visit: Payer: Self-pay

## 2024-10-30 ENCOUNTER — Other Ambulatory Visit (HOSPITAL_COMMUNITY): Payer: Self-pay

## 2024-10-30 NOTE — Telephone Encounter (Signed)
 Call received from Schuyler Allis, MSW/ [employer] [place] stating that their team was able to reach the patient today and they obtained an extra consent to from the patient to discuss her legal issues with me.   Schuyler explained that they need a doctor's note stating the reason for the accommodation for the patient to move to a first floor apartment.  I explained that Dr Vicci has already done this letter and I sent it to the patient about a month ago.  Olivia said that the patient has not received it.  I told Schuyler that I can email it to her.  I explained that the letter is dated 07/19/2024.  Schuyler said that the attorneys will review it and if they need an updated letter, she will let me know,  Letter then emailed to :  [email or web address]

## 2024-11-04 ENCOUNTER — Ambulatory Visit

## 2024-11-05 ENCOUNTER — Ambulatory Visit: Payer: Self-pay | Admitting: Internal Medicine
